# Patient Record
Sex: Male | Born: 1965 | Race: Black or African American | Hispanic: No | Marital: Single | Smoking: Current every day smoker
Health system: Southern US, Community
[De-identification: ages and names within clinical notes are randomized; demographics above are authoritative.]

## PROBLEM LIST (undated history)

## (undated) DIAGNOSIS — I10 Essential (primary) hypertension: Secondary | ICD-10-CM

## (undated) DIAGNOSIS — Z5900 Homelessness unspecified: Secondary | ICD-10-CM

## (undated) DIAGNOSIS — K922 Gastrointestinal hemorrhage, unspecified: Secondary | ICD-10-CM

## (undated) DIAGNOSIS — F10239 Alcohol dependence with withdrawal, unspecified: Secondary | ICD-10-CM

## (undated) DIAGNOSIS — F1020 Alcohol dependence, uncomplicated: Secondary | ICD-10-CM

## (undated) DIAGNOSIS — K8689 Other specified diseases of pancreas: Secondary | ICD-10-CM

## (undated) DIAGNOSIS — R7401 Elevation of levels of liver transaminase levels: Secondary | ICD-10-CM

## (undated) DIAGNOSIS — F10939 Alcohol use, unspecified with withdrawal, unspecified: Secondary | ICD-10-CM

## (undated) HISTORY — DX: Other specified diseases of pancreas: K86.89

## (undated) HISTORY — DX: Alcohol dependence with withdrawal, unspecified: F10.239

## (undated) HISTORY — DX: Elevation of levels of liver transaminase levels: R74.01

## (undated) HISTORY — DX: Alcohol use, unspecified with withdrawal, unspecified: F10.939

---

## 2014-03-17 DIAGNOSIS — Z720 Tobacco use: Secondary | ICD-10-CM | POA: Insufficient documentation

## 2014-03-17 DIAGNOSIS — I10 Essential (primary) hypertension: Secondary | ICD-10-CM | POA: Insufficient documentation

## 2015-08-30 DIAGNOSIS — Z5900 Homelessness unspecified: Secondary | ICD-10-CM

## 2015-08-31 NOTE — Congregational Nurse Program (Unsigned)
Client came to see mental Health CN at [facility].  Client stated, "I am going to be out of my Trazadone in three days.  I need you to [facility], Dr. Sheran FavaWeed and get the information to order them."  Client informed that in order for him to get his medication at no cost to him he needs to make an appointment with Lavinia SharpsMary Ann Placey, FNP.  Client said that he filled out initial paperwork to start appointments with Chales AbrahamsMary Ann and that he put the paper work under her door to be processed.  He did not hear back from the appointments desk about an appointment and was sad about this.  CN called and left a message for medical at [facility] to call me back to confirm an appointment for him.  Appointments desk Leavy CellaJasmine did call back and leave a message that he did not shoe for a June 22 appointment and the next available appointment was August 3 at 1:30p.  Leavy CellaJasmine said she did call him at his number, [phone] and left him a message that he could come in as a walk in client [facility].  Client denies suicidal or homicidal ideations and plan, client states,"I have a job now at night and I really need to sleep. I need my Trazodone for that."  Client informed that Lavinia SharpsMary Ann Placey, FNP needs to write his prescriptions and he can get them free from the health department.

## 2015-09-06 ENCOUNTER — Emergency Department (HOSPITAL_COMMUNITY)
Admission: EM | Admit: 2015-09-06 | Discharge: 2015-09-06 | Disposition: A | Discharge status: Home / Self Care | Payer: Self-pay | Attending: Emergency Medicine | Admitting: Emergency Medicine

## 2015-09-06 ENCOUNTER — Encounter (HOSPITAL_COMMUNITY): Payer: Self-pay | Admitting: Emergency Medicine

## 2015-09-06 DIAGNOSIS — E860 Dehydration: Secondary | ICD-10-CM | POA: Insufficient documentation

## 2015-09-06 DIAGNOSIS — F17200 Nicotine dependence, unspecified, uncomplicated: Secondary | ICD-10-CM | POA: Insufficient documentation

## 2015-09-06 DIAGNOSIS — I10 Essential (primary) hypertension: Secondary | ICD-10-CM | POA: Insufficient documentation

## 2015-09-06 DIAGNOSIS — R252 Cramp and spasm: Secondary | ICD-10-CM | POA: Insufficient documentation

## 2015-09-06 HISTORY — DX: Essential (primary) hypertension: I10

## 2015-09-06 LAB — BASIC METABOLIC PANEL
ANION GAP: 10 (ref 5–15)
BUN: 9 mg/dL (ref 6–20)
CALCIUM: 9.2 mg/dL (ref 8.9–10.3)
CO2: 23 mmol/L (ref 22–32)
Chloride: 97 mmol/L — ABNORMAL LOW (ref 101–111)
Creatinine, Ser: 0.92 mg/dL (ref 0.61–1.24)
GFR calc Af Amer: >60 mL/min (ref >60)
GLUCOSE: 87 mg/dL (ref 65–99)
Potassium: 3.8 mmol/L (ref 3.5–5.1)
SODIUM: 130 mmol/L — AB (ref 135–145)

## 2015-09-06 LAB — CBG MONITORING, ED: GLUCOSE-CAPILLARY: 106 mg/dL — AB (ref 65–99)

## 2015-09-06 MED ORDER — SODIUM CHLORIDE 0.9 % IV BOLUS (SEPSIS)
1000.0000 mL | Freq: Once | INTRAVENOUS | Status: AC
  Administered 2015-09-06: 1000 mL via INTRAVENOUS

## 2015-09-06 NOTE — ED Provider Notes (Signed)
CSN: [id number]     Arrival date & time 09/06/15  [id number] History   First MD Initiated Contact with Patient 09/06/15 [phone]     Chief Complaint  Patient presents with  . Abdominal Cramping    HPI  Patient presents with concern of diffuse cramping. Symptoms began yesterday. Since onset symptoms were persistent, worsening, diffuse. However, symptoms started to improve after the patient received IV fluids, oral rehydration provided by EMS, en route. Patient describes diffuse discomfort with the cramping, though no particular focal pain beyond abdomen, back, fingers, hands. No confusion, disorientation, syncope. No dyspnea. Patient was well prior to the onset of symptoms. 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 Yesterday was particularly hot.   Past Medical History  Diagnosis Date  . Hypertension    History reviewed. No pertinent past surgical history. No family history on file. Social History  Substance Use Topics  . Smoking status: Current Every Day Smoker  . Smokeless tobacco: None  . Alcohol Use: None    Review of Systems  Constitutional:       Per HPI, otherwise negative  HENT:       Per HPI, otherwise negative  Respiratory:       Per HPI, otherwise negative  Cardiovascular:       Per HPI, otherwise negative  Gastrointestinal: Negative for vomiting.  Endocrine:       Negative aside from HPI  Genitourinary:       Neg aside from HPI   Musculoskeletal:       Per HPI, otherwise negative  Skin: Negative.   Neurological: Negative for syncope.      Allergies  Review of patient's allergies indicates no known allergies.  Home Medications   Prior to Admission medications   Medication Sig Start Date End Date Taking? Authorizing Provider  hydrochlorothiazide (HYDRODIURIL) 25 MG tablet Take 25 mg by mouth daily.    Historical Provider, MD  metoprolol tartrate (LOPRESSOR) 25 MG tablet Take 12.5 mg by mouth 2 (two) times daily.    Historical Provider, MD   sertraline (ZOLOFT) 50 MG tablet Take 50 mg by mouth daily.    Historical Provider, MD   BP 137/79 mmHg  Pulse 76  Temp(Src) 98 F (36.7 C) (Oral)  Resp 18  SpO2 98% Physical Exam  Constitutional: He is oriented to person, place, and time. He appears well-developed. No distress.  HENT:  Head: Normocephalic and atraumatic.  Eyes: Conjunctivae and EOM are normal.  Cardiovascular: Normal rate and regular rhythm.   Pulmonary/Chest: Effort normal. No stridor. No respiratory distress.  Abdominal: He exhibits no distension.  Musculoskeletal: He exhibits no edema.  Neurological: He is alert and oriented to person, place, and time. No cranial nerve deficit. Coordination normal.  Skin: Skin is warm and dry.  Psychiatric: He has a normal mood and affect.  Nursing note and vitals reviewed.   ED Course  Procedures (including critical care time) Labs Review Labs Reviewed  BASIC METABOLIC PANEL - Abnormal; Notable for the following:    Sodium 130 (*)    Chloride 97 (*)    All other components within normal limits  CBG MONITORING, ED - Abnormal; Notable for the following:    Glucose-Capillary 106 (*)    All other components within normal limits  URINALYSIS, ROUTINE W REFLEX MICROSCOPIC (NOT AT [facility])    Following fluid resuscitation the patient is substantially better.   MDM  Patient presents with diffuse cramping, discomfort, but no evidence for distress, hemodynamically  compromised. Patient's history of working outside, and extremes encounter improve suggests dehydration. No lab evidence for acute kidney injury. Patient had multiple fluid boluses, with substantial improvement, was discharged in stable condition with primary care follow-up.  Gerhard Munch, MD 09/06/15 1017

## 2015-09-06 NOTE — ED Notes (Addendum)
Pt cbg 106 

## 2015-09-06 NOTE — Discharge Instructions (Signed)
As discussed, your evaluation today has been largely reassuring.  But, it is important that you monitor your condition carefully, and do not hesitate to return to the ED if you develop new, or concerning changes in your condition.  Otherwise, please follow-up with your physician for appropriate ongoing care.  Dehydration, Adult Dehydration is a condition in which you do not have enough fluid or water in your body. It happens when you take in less fluid than you lose. Vital organs such as the kidneys, brain, and heart cannot function without a proper amount of fluids. Any loss of fluids from the body can cause dehydration.  Dehydration can range from mild to severe. This condition should be treated right away to help prevent it from becoming severe. CAUSES  This condition may be caused by:  Vomiting.  Diarrhea.  Excessive sweating, such as when exercising in hot or humid weather.  Not drinking enough fluid during strenuous exercise or during an illness.  Excessive urine output.  Fever.  Certain medicines. RISK FACTORS This condition is more likely to develop in:  People who are taking certain medicines that cause the body to lose excess fluid (diuretics).   People who have a chronic illness, such as diabetes, that may increase urination.  Older adults.   People who live at high altitudes.   People who participate in endurance sports.  SYMPTOMS  Mild Dehydration  Thirst.  Dry lips.  Slightly dry mouth.  Dry, warm skin. Moderate Dehydration  Very dry mouth.   Muscle cramps.   Dark urine and decreased urine production.   Decreased tear production.   Headache.   Light-headedness, especially when you stand up from a sitting position.  Severe Dehydration  Changes in skin.   Cold and clammy skin.   Skin does not spring back quickly when lightly pinched and released.   Changes in body fluids.   Extreme thirst.   No tears.   Not able to  sweat when body temperature is high, such as in hot weather.   Minimal urine production.   Changes in vital signs.   Rapid, weak pulse (more than 100 beats per minute when you are sitting still).   Rapid breathing.   Low blood pressure.   Other changes.   Sunken eyes.   Cold hands and feet.   Confusion.  Lethargy and difficulty being awakened.  Fainting (syncope).   Short-term weight loss.   Unconsciousness. DIAGNOSIS  This condition may be diagnosed based on your symptoms. You may also have tests to determine how severe your dehydration is. These tests may include:   Urine tests.   Blood tests.  TREATMENT  Treatment for this condition depends on the severity. Mild or moderate dehydration can often be treated at home. Treatment should be started right away. Do not wait until dehydration becomes severe. Severe dehydration needs to be treated at the hospital. Treatment for Mild Dehydration  Drinking plenty of water to replace the fluid you have lost.   Replacing minerals in your blood (electrolytes) that you may have lost.  Treatment for Moderate Dehydration  Consuming oral rehydration solution (ORS). Treatment for Severe Dehydration  Receiving fluid through an IV tube.   Receiving electrolyte solution through a feeding tube that is passed through your nose and into your stomach (nasogastric tube or NG tube).  Correcting any abnormalities in electrolytes. HOME CARE INSTRUCTIONS   Drink enough fluid to keep your urine clear or pale yellow.   Drink water or fluid slowly  by taking small sips. You can also try sucking on ice cubes.  Have food or beverages that contain electrolytes. Examples include bananas and sports drinks.  Take over-the-counter and prescription medicines only as told by your health care provider.   Prepare ORS according to the manufacturer's instructions. Take sips of ORS every 5 minutes until your urine returns to  normal.  If you have vomiting or diarrhea, continue to try to drink water, ORS, or both.   If you have diarrhea, avoid:   Beverages that contain caffeine.   Fruit juice.   Milk.   Carbonated soft drinks.  Do not take salt tablets. This can lead to the condition of having too much sodium in your body (hypernatremia).  SEEK MEDICAL CARE IF:  You cannot eat or drink without vomiting.  You have had moderate diarrhea during a period of more than 24 hours.  You have a fever. SEEK IMMEDIATE MEDICAL CARE IF:   You have extreme thirst.  You have severe diarrhea.  You have not urinated in 6-8 hours, or you have urinated only a small amount of very dark urine.  You have shriveled skin.  You are dizzy, confused, or both.   This information is not intended to replace advice given to you by your health care provider. Make sure you discuss any questions you have with your health care provider.   Document Released: 02/11/2005 Document Revised: 11/02/2014 Document Reviewed: 06/29/2014 Elsevier Interactive Patient Education [employer]

## 2015-09-06 NOTE — ED Notes (Addendum)
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  Had a gatorade this am , lives at the shelter

## 2015-09-06 NOTE — ED Notes (Signed)
Pt is in stable condition upon d/c and ambulates from ED. 

## 2015-09-06 NOTE — ED Notes (Signed)
Myself and Tanisha, NT undressed patient, placed in gown, on continuous pulse oximetry and blood pressure cuff

## 2015-09-07 DIAGNOSIS — Z5900 Homelessness unspecified: Secondary | ICD-10-CM

## 2015-09-29 NOTE — Congregational Nurse Program (Signed)
Congregational Nurse Program Note  Date of Encounter: 09/07/2015  Past Medical History: Past Medical History:  Diagnosis Date  . Hypertension     Encounter Details:     CNP Questionnaire - 09/07/15 1710      Patient Demographics   Is this a new or existing patient? Existing   Patient is considered a/an Not Applicable   Race African-American/Black     Patient Assistance   Location of Patient Assistance Not Applicable   Patient's financial/insurance status Self-Pay   Uninsured Patient Yes   Interventions Counseled to make appt. with provider;Assisted patient in making appt.   Patient referred to apply for the following financial assistance [employer] insecurities addressed Provided food supplies   Transportation assistance No   Assistance securing medications Yes   Type of Assistance Other   Educational health offerings Acute disease;Behavioral health;Medications;Navigating the healthcare system;Safety     Encounter Details   Primary purpose of visit Acute Illness/Condition Visit;Safety   Was an Emergency Department visit averted? Yes   Does patient have a medical provider? No   Patient referred to Area Agency;Clinic   Was a mental health screening completed? (GAINS tool) No   Does patient have dental issues? No   Was a dental referral made? No   Does patient have vision issues? No   Was a vision referral made? No   Does your patient have an abnormal blood pressure today? No   Since previous encounter, have you referred patient for abnormal blood pressure that resulted in a new diagnosis or medication change? No   Does your patient have an abnormal blood glucose today? No   Since previous encounter, have you referred patient for abnormal blood glucose that resulted in a new diagnosis or medication change? No   Was there a life-saving intervention made? No      States is out of his Trazodone and needs it "today".  Discussed with him that he would  need a prescription and how to navigate the health care system to obtain a prescription.  Is from [facility] and has been receiving his meds the medication assistance program there.  Inquired if there was anyway he could return to [facility] to obtain medication refills while he awaiting an appointment with a local provider.  He stated he would take the [employer] bus transportation system.

## 2015-11-22 ENCOUNTER — Encounter (HOSPITAL_COMMUNITY): Payer: Self-pay | Admitting: Emergency Medicine

## 2015-11-22 ENCOUNTER — Inpatient Hospital Stay (HOSPITAL_COMMUNITY)
Admission: EM | Admit: 2015-11-22 | Discharge: 2015-11-24 | DRG: 195 | Disposition: A | Discharge status: Home / Self Care | Payer: Self-pay | Attending: Internal Medicine | Admitting: Internal Medicine

## 2015-11-22 ENCOUNTER — Emergency Department (HOSPITAL_COMMUNITY): Payer: Self-pay

## 2015-11-22 DIAGNOSIS — R590 Localized enlarged lymph nodes: Secondary | ICD-10-CM | POA: Diagnosis present

## 2015-11-22 DIAGNOSIS — R05 Cough: Secondary | ICD-10-CM

## 2015-11-22 DIAGNOSIS — R0602 Shortness of breath: Secondary | ICD-10-CM

## 2015-11-22 DIAGNOSIS — D649 Anemia, unspecified: Secondary | ICD-10-CM | POA: Diagnosis present

## 2015-11-22 DIAGNOSIS — R059 Cough, unspecified: Secondary | ICD-10-CM

## 2015-11-22 DIAGNOSIS — J9600 Acute respiratory failure, unspecified whether with hypoxia or hypercapnia: Secondary | ICD-10-CM | POA: Diagnosis present

## 2015-11-22 DIAGNOSIS — J181 Lobar pneumonia, unspecified organism: Principal | ICD-10-CM | POA: Diagnosis present

## 2015-11-22 DIAGNOSIS — J9601 Acute respiratory failure with hypoxia: Secondary | ICD-10-CM | POA: Diagnosis present

## 2015-11-22 DIAGNOSIS — J189 Pneumonia, unspecified organism: Secondary | ICD-10-CM

## 2015-11-22 DIAGNOSIS — J40 Bronchitis, not specified as acute or chronic: Secondary | ICD-10-CM | POA: Diagnosis present

## 2015-11-22 DIAGNOSIS — I10 Essential (primary) hypertension: Secondary | ICD-10-CM | POA: Diagnosis present

## 2015-11-22 DIAGNOSIS — F17200 Nicotine dependence, unspecified, uncomplicated: Secondary | ICD-10-CM | POA: Diagnosis present

## 2015-11-22 DIAGNOSIS — Z590 Homelessness: Secondary | ICD-10-CM

## 2015-11-22 LAB — COMPREHENSIVE METABOLIC PANEL
ALBUMIN: 3.9 g/dL (ref 3.5–5.0)
ALT: 48 U/L (ref 17–63)
AST: 84 U/L — AB (ref 15–41)
Alkaline Phosphatase: 96 U/L (ref 38–126)
Anion gap: 9 (ref 5–15)
BILIRUBIN TOTAL: 0.4 mg/dL (ref 0.3–1.2)
BUN: 10 mg/dL (ref 6–20)
CO2: 23 mmol/L (ref 22–32)
CREATININE: 0.71 mg/dL (ref 0.61–1.24)
Calcium: 9.4 mg/dL (ref 8.9–10.3)
Chloride: 104 mmol/L (ref 101–111)
GFR calc Af Amer: >60 mL/min (ref >60)
GLUCOSE: 90 mg/dL (ref 65–99)
POTASSIUM: 3.9 mmol/L (ref 3.5–5.1)
Sodium: 136 mmol/L (ref 135–145)
TOTAL PROTEIN: 8.9 g/dL — AB (ref 6.5–8.1)

## 2015-11-22 LAB — CBC WITH DIFFERENTIAL/PLATELET
BASOS ABS: 0 10*3/uL (ref 0.0–0.1)
Basophils Relative: 0 %
EOS ABS: 0.2 10*3/uL (ref 0.0–0.7)
EOS PCT: 2 %
HEMATOCRIT: 29.5 % — AB (ref 39.0–52.0)
Hemoglobin: 9.4 g/dL — ABNORMAL LOW (ref 13.0–17.0)
LYMPHS ABS: 2.1 10*3/uL (ref 0.7–4.0)
LYMPHS PCT: 23 %
MCH: 26.4 pg (ref 26.0–34.0)
MCHC: 31.9 g/dL (ref 30.0–36.0)
MCV: 82.9 fL (ref 78.0–100.0)
MONO ABS: 1.1 10*3/uL — AB (ref 0.1–1.0)
Monocytes Relative: 12 %
Neutro Abs: 5.5 10*3/uL (ref 1.7–7.7)
Neutrophils Relative %: 62 %
PLATELETS: 297 10*3/uL (ref 150–400)
RBC: 3.56 MIL/uL — AB (ref 4.22–5.81)
RDW: 16.4 % — AB (ref 11.5–15.5)
WBC: 8.9 10*3/uL (ref 4.0–10.5)

## 2015-11-22 LAB — RAPID STREP SCREEN (MED CTR MEBANE ONLY): Streptococcus, Group A Screen (Direct): NEGATIVE

## 2015-11-22 MED ORDER — PREDNISONE 20 MG PO TABS
60.0000 mg | ORAL_TABLET | Freq: Once | ORAL | Status: AC
  Administered 2015-11-22: 60 mg via ORAL
  Filled 2015-11-22: qty 3

## 2015-11-22 MED ORDER — ALBUTEROL (5 MG/ML) CONTINUOUS INHALATION SOLN: 5.0000 mg/h | INHALATION_SOLUTION | Freq: Once | RESPIRATORY_TRACT | Status: DC

## 2015-11-22 MED ORDER — ALBUTEROL (5 MG/ML) CONTINUOUS INHALATION SOLN
10.0000 mg/h | INHALATION_SOLUTION | Freq: Once | RESPIRATORY_TRACT | Status: AC
  Administered 2015-11-22: 10 mg/h via RESPIRATORY_TRACT
  Filled 2015-11-22: qty 20

## 2015-11-22 MED ORDER — IPRATROPIUM-ALBUTEROL 0.5-2.5 (3) MG/3ML IN SOLN
3.0000 mL | Freq: Once | RESPIRATORY_TRACT | Status: AC
  Administered 2015-11-22: 3 mL via RESPIRATORY_TRACT
  Filled 2015-11-22: qty 3

## 2015-11-22 MED ORDER — IBUPROFEN 200 MG PO TABS
600.0000 mg | ORAL_TABLET | Freq: Once | ORAL | Status: AC
  Administered 2015-11-22: 600 mg via ORAL
  Filled 2015-11-22: qty 3

## 2015-11-22 MED ORDER — LEVOFLOXACIN 750 MG PO TABS
750.0000 mg | ORAL_TABLET | Freq: Once | ORAL | Status: AC
  Administered 2015-11-22: 750 mg via ORAL
  Filled 2015-11-22 (×2): qty 1

## 2015-11-22 NOTE — ED Notes (Signed)
RTT paged for neb.

## 2015-11-22 NOTE — ED Notes (Signed)
Pt c/o epistaxis. Given tissues. Will notify provider.

## 2015-11-22 NOTE — ED Notes (Signed)
MD at bedside. 

## 2015-11-22 NOTE — ED Provider Notes (Signed)
[facility]-EMERGENCY DEPT Provider Note   CSN: [id number] Arrival date & time: 11/22/15  1739  By signing my name below, I, Modena Jansky, attest that this documentation has been prepared under the direction and in the presence of non-physician practitioner, Terance Hart, PA-C. Electronically Signed: Modena Jansky, Scribe. 11/22/2015. 6:23 PM.  History   Chief Complaint Chief Complaint  Patient presents with  . Generalized Body Aches   The history is provided by the patient. No language interpreter was used.   HPI Comments: Samuel Blevins is a 50 y.o. male who presents to the Emergency Department complaining of constant moderate sore throat that started 2 days ago. He is homeless and currently resides at a shelter. Other associated symptoms include intermittent cough productive of yellow sputum, generalized myalgias, diaphoresis, chills, HA, and wheezing. He has tried [facility] powder for symptoms without any relief. Reports a hx of pneumonia and smoking. Denies any sick contacts.   Past Medical History:  Diagnosis Date  . Hypertension     There are no active problems to display for this patient.   History reviewed. No pertinent surgical history.     Home Medications    Prior to Admission medications   Medication Sig Start Date End Date Taking? Authorizing Provider  hydrochlorothiazide (HYDRODIURIL) 25 MG tablet Take 25 mg by mouth daily.    Historical Provider, MD  metoprolol tartrate (LOPRESSOR) 25 MG tablet Take 12.5 mg by mouth 2 (two) times daily.    Historical Provider, MD  sertraline (ZOLOFT) 50 MG tablet Take 50 mg by mouth daily.    Historical Provider, MD    Family History No family history on file.  Social History Social History  Substance Use Topics  . Smoking status: Current Every Day Smoker  . Smokeless tobacco: Never Used  . Alcohol use Not on file     Allergies   Review of patient's allergies indicates no known allergies.   Review of Systems Review of  Systems  Constitutional: Positive for chills and diaphoresis. Negative for fever.  Respiratory: Positive for cough and wheezing.   Cardiovascular: Negative for chest pain.  Gastrointestinal: Positive for abdominal pain (Right-sided). Negative for nausea and vomiting.  Musculoskeletal: Positive for myalgias (Generalized).  Neurological: Positive for headaches. Negative for light-headedness.  All other systems reviewed and are negative.    Physical Exam Updated Vital Signs BP 145/85   Pulse 100   Temp 98.4 F (36.9 C) (Oral)   Resp 20   SpO2 96%   Physical Exam  Constitutional: He is oriented to person, place, and time. He appears well-developed and well-nourished. He appears ill. No distress.  Rigors  HENT:  Head: Normocephalic and atraumatic.  Eyes: Conjunctivae are normal. Pupils are equal, round, and reactive to light. Right eye exhibits no discharge. Left eye exhibits no discharge. No scleral icterus.  Neck: Normal range of motion. Neck supple.  Cardiovascular: Normal rate and regular rhythm.  Exam reveals no gallop and no friction rub.   No murmur heard. Pulmonary/Chest: Effort normal. No respiratory distress. He has wheezes. He has rhonchi. He has no rales. He exhibits no tenderness.  Diffuse rhonchi with mild wheezes  Abdominal: Soft. Bowel sounds are normal. He exhibits no distension and no mass. There is tenderness. There is guarding. There is no rebound. No hernia.  Mild generalized tenderness  Musculoskeletal: He exhibits no edema.  Neurological: He is alert and oriented to person, place, and time.  Skin: Skin is warm and dry.  Psychiatric: He has a normal  mood and affect.  Nursing note and vitals reviewed.    ED Treatments / Results  DIAGNOSTIC STUDIES: Oxygen Saturation is 96% on RA, normal by my interpretation.    COORDINATION OF CARE: 6:27 PM- Pt advised of plan for treatment and pt agrees.  Labs (all labs ordered are listed, but only abnormal results  are displayed) Labs Reviewed  COMPREHENSIVE METABOLIC PANEL - Abnormal; Notable for the following:       Result Value   Total Protein 8.9 (*)    AST 84 (*)    All other components within normal limits  CBC WITH DIFFERENTIAL/PLATELET - Abnormal; Notable for the following:    RBC 3.56 (*)    Hemoglobin 9.4 (*)    HCT 29.5 (*)    RDW 16.4 (*)    Monocytes Absolute 1.1 (*)    All other components within normal limits  RAPID STREP SCREEN (NOT AT [facility])  CULTURE, GROUP A STREP [facility])    EKG  EKG Interpretation None       Radiology Dg Chest 2 View  Result Date: 11/22/2015 CLINICAL DATA:  Body ache, chills and cough for 2 days. EXAM: CHEST  2 VIEW COMPARISON:  None. FINDINGS: The heart size and mediastinal contours are within normal limits. Both lungs are clear. The visualized skeletal structures are unremarkable. IMPRESSION: No active cardiopulmonary disease. Electronically Signed   By: Tollie Ethavid  Kwon M.D.   On: 11/22/2015 19:02    Procedures Procedures (including critical care time)  Medications Ordered in ED Medications  predniSONE (DELTASONE) tablet 60 mg (not administered)  ibuprofen (ADVIL,MOTRIN) tablet 600 mg (600 mg Oral Given 11/22/15 1858)  levofloxacin (LEVAQUIN) tablet 750 mg (750 mg Oral Given 11/22/15 1858)  ipratropium-albuterol (DUONEB) 0.5-2.5 (3) MG/3ML nebulizer solution 3 mL (3 mLs Nebulization Given 11/22/15 2013)  albuterol (PROVENTIL,VENTOLIN) solution continuous neb (10 mg/hr Nebulization Given 11/22/15 2134)     Initial Impression / Assessment and Plan / ED Course  I have reviewed the triage vital signs and the nursing notes.  Pertinent labs & imaging results that were available during my care of the patient were reviewed by me and considered in my medical decision making (see chart for details).  Clinical Course   50 year old male presents with productive cough and SOB. Patient is afebrile, not tachycardic, normotensive, and not hypoxic. He is tachypneic  and has frequent coughing with rigors on exam. CBC remarkable for anemia with no comparison. CMP unremarkable. Rapid strep is negative. CXR negative. Due to exam findings will treat for PNA with Levaquin. Ibuprofen given as well as Duoneb.  On recheck patient sounds mildly improved. Will given continuous neb and recheck. Patient signed out at shift change to Dr. Criss AlvineGoldston. Anticipate d/c.  Final Clinical Impressions(s) / ED Diagnoses   Final diagnoses:  Cough  Community acquired pneumonia    New Prescriptions New Prescriptions   No medications on file   I personally performed the services described in this documentation, which was scribed in my presence. The recorded information has been reviewed and is accurate.     Bethel BornKelly Marie Momoka [place], PA-C 11/22/15 2154    Pricilla LovelessScott Goldston, MD 11/22/15 [phone]

## 2015-11-22 NOTE — ED Triage Notes (Signed)
[facility]  Body aches and cough  x 2 days. sts nausea . Alert and oriented x 4.

## 2015-11-22 NOTE — ED Notes (Signed)
Bed: WTR6 Expected date:  Expected time:  Means of arrival:  Comments: 

## 2015-11-22 NOTE — ED Notes (Signed)
Bed: WTR8 Expected date:  Expected time:  Means of arrival:  Comments: 

## 2015-11-23 ENCOUNTER — Encounter (HOSPITAL_COMMUNITY): Payer: Self-pay | Admitting: Internal Medicine

## 2015-11-23 ENCOUNTER — Observation Stay (HOSPITAL_COMMUNITY): Payer: Self-pay

## 2015-11-23 DIAGNOSIS — J181 Lobar pneumonia, unspecified organism: Principal | ICD-10-CM

## 2015-11-23 DIAGNOSIS — D649 Anemia, unspecified: Secondary | ICD-10-CM

## 2015-11-23 DIAGNOSIS — J9600 Acute respiratory failure, unspecified whether with hypoxia or hypercapnia: Secondary | ICD-10-CM | POA: Diagnosis present

## 2015-11-23 DIAGNOSIS — J9601 Acute respiratory failure with hypoxia: Secondary | ICD-10-CM

## 2015-11-23 HISTORY — DX: Anemia, unspecified: D64.9

## 2015-11-23 LAB — VITAMIN B12: Vitamin B-12: 419 pg/mL (ref 180–914)

## 2015-11-23 LAB — RAPID URINE DRUG SCREEN, HOSP PERFORMED
Amphetamines: NOT DETECTED
BARBITURATES: NOT DETECTED
Benzodiazepines: NOT DETECTED
COCAINE: NOT DETECTED
Opiates: POSITIVE — AB
Tetrahydrocannabinol: NOT DETECTED

## 2015-11-23 LAB — BRAIN NATRIURETIC PEPTIDE: B Natriuretic Peptide: 286.7 pg/mL — ABNORMAL HIGH (ref 0.0–100.0)

## 2015-11-23 LAB — CBC WITH DIFFERENTIAL/PLATELET
Basophils Absolute: 0 10*3/uL (ref 0.0–0.1)
Basophils Relative: 0 %
EOS PCT: 0 %
Eosinophils Absolute: 0 10*3/uL (ref 0.0–0.7)
HEMATOCRIT: 29 % — AB (ref 39.0–52.0)
HEMOGLOBIN: 9.3 g/dL — AB (ref 13.0–17.0)
LYMPHS ABS: 1 10*3/uL (ref 0.7–4.0)
LYMPHS PCT: 9 %
MCH: 26.3 pg (ref 26.0–34.0)
MCHC: 32.1 g/dL (ref 30.0–36.0)
MCV: 82.2 fL (ref 78.0–100.0)
MONOS PCT: 3 %
Monocytes Absolute: 0.3 10*3/uL (ref 0.1–1.0)
Neutro Abs: 9.4 10*3/uL — ABNORMAL HIGH (ref 1.7–7.7)
Neutrophils Relative %: 88 %
Platelets: 272 10*3/uL (ref 150–400)
RBC: 3.53 MIL/uL — AB (ref 4.22–5.81)
RDW: 16.3 % — AB (ref 11.5–15.5)
WBC: 10.7 10*3/uL — AB (ref 4.0–10.5)

## 2015-11-23 LAB — IRON AND TIBC
IRON: 14 ug/dL — AB (ref 45–182)
SATURATION RATIOS: 3 % — AB (ref 17.9–39.5)
TIBC: 437 ug/dL (ref 250–450)
UIBC: 423 ug/dL

## 2015-11-23 LAB — HEPATIC FUNCTION PANEL
ALT: 46 U/L (ref 17–63)
AST: 72 U/L — AB (ref 15–41)
Albumin: 3.8 g/dL (ref 3.5–5.0)
Alkaline Phosphatase: 100 U/L (ref 38–126)
BILIRUBIN TOTAL: 0.4 mg/dL (ref 0.3–1.2)
Total Protein: 8.5 g/dL — ABNORMAL HIGH (ref 6.5–8.1)

## 2015-11-23 LAB — BASIC METABOLIC PANEL
Anion gap: 11 (ref 5–15)
BUN: 10 mg/dL (ref 6–20)
CHLORIDE: 103 mmol/L (ref 101–111)
CO2: 21 mmol/L — ABNORMAL LOW (ref 22–32)
Calcium: 9.2 mg/dL (ref 8.9–10.3)
Creatinine, Ser: 0.61 mg/dL (ref 0.61–1.24)
GFR calc Af Amer: >60 mL/min (ref >60)
GFR calc non Af Amer: >60 mL/min (ref >60)
Glucose, Bld: 119 mg/dL — ABNORMAL HIGH (ref 65–99)
POTASSIUM: 3.9 mmol/L (ref 3.5–5.1)
SODIUM: 135 mmol/L (ref 135–145)

## 2015-11-23 LAB — CK: Total CK: 290 U/L (ref 49–397)

## 2015-11-23 LAB — FOLATE: FOLATE: 10.4 ng/mL (ref >5.9)

## 2015-11-23 LAB — RETICULOCYTES
RBC.: 3.53 MIL/uL — ABNORMAL LOW (ref 4.22–5.81)
Retic Count, Absolute: 81.2 10*3/uL (ref 19.0–186.0)
Retic Ct Pct: 2.3 % (ref 0.4–3.1)

## 2015-11-23 LAB — LACTIC ACID, PLASMA: Lactic Acid, Venous: 1.2 mmol/L (ref 0.5–1.9)

## 2015-11-23 LAB — TSH: TSH: 2.216 u[IU]/mL (ref 0.350–4.500)

## 2015-11-23 LAB — TROPONIN I

## 2015-11-23 LAB — FERRITIN: Ferritin: 15 ng/mL — ABNORMAL LOW (ref 24–336)

## 2015-11-23 LAB — LIPASE, BLOOD: Lipase: 33 U/L (ref 11–51)

## 2015-11-23 MED ORDER — GUAIFENESIN-DM 100-10 MG/5ML PO SYRP
5.0000 mL | ORAL_SOLUTION | ORAL | Status: DC | PRN
  Administered 2015-11-23: 5 mL via ORAL
  Filled 2015-11-23: qty 10

## 2015-11-23 MED ORDER — IBUPROFEN 800 MG PO TABS
800.0000 mg | ORAL_TABLET | Freq: Once | ORAL | Status: AC
  Administered 2015-11-23: 800 mg via ORAL
  Filled 2015-11-23: qty 1

## 2015-11-23 MED ORDER — THIAMINE HCL 100 MG/ML IJ SOLN: 100.0000 mg | Freq: Every day | INTRAMUSCULAR | Status: DC

## 2015-11-23 MED ORDER — ENOXAPARIN SODIUM 40 MG/0.4ML ~~LOC~~ SOLN
40.0000 mg | SUBCUTANEOUS | Status: DC
  Administered 2015-11-23: 40 mg via SUBCUTANEOUS
  Filled 2015-11-23: qty 0.4

## 2015-11-23 MED ORDER — LORAZEPAM 2 MG/ML IJ SOLN: 1.0000 mg | Freq: Four times a day (QID) | INTRAMUSCULAR | Status: DC | PRN

## 2015-11-23 MED ORDER — VITAMIN B-1 100 MG PO TABS
100.0000 mg | ORAL_TABLET | Freq: Every day | ORAL | Status: DC
Start: 2015-11-23 — End: 2015-11-23
  Administered 2015-11-23: 100 mg via ORAL
  Filled 2015-11-23: qty 1

## 2015-11-23 MED ORDER — ACETAMINOPHEN 325 MG PO TABS
650.0000 mg | ORAL_TABLET | Freq: Four times a day (QID) | ORAL | Status: DC | PRN
  Administered 2015-11-23 – 2015-11-24 (×4): 650 mg via ORAL
  Filled 2015-11-23 (×4): qty 2

## 2015-11-23 MED ORDER — GUAIFENESIN 100 MG/5ML PO SOLN: 5.0000 mL | ORAL | Status: DC | PRN

## 2015-11-23 MED ORDER — ONDANSETRON HCL 4 MG PO TABS: 4.0000 mg | ORAL_TABLET | Freq: Four times a day (QID) | ORAL | Status: DC | PRN

## 2015-11-23 MED ORDER — IOPAMIDOL (ISOVUE-370) INJECTION 76%
100.0000 mL | Freq: Once | INTRAVENOUS | Status: AC | PRN
  Administered 2015-11-23: 100 mL via INTRAVENOUS

## 2015-11-23 MED ORDER — IPRATROPIUM BROMIDE 0.02 % IN SOLN: 0.5000 mg | RESPIRATORY_TRACT | Status: DC

## 2015-11-23 MED ORDER — BUDESONIDE 0.25 MG/2ML IN SUSP
0.2500 mg | Freq: Two times a day (BID) | RESPIRATORY_TRACT | Status: DC
  Administered 2015-11-23 – 2015-11-24 (×3): 0.25 mg via RESPIRATORY_TRACT
  Filled 2015-11-23 (×2): qty 2

## 2015-11-23 MED ORDER — LORAZEPAM 1 MG PO TABS: 1.0000 mg | ORAL_TABLET | Freq: Four times a day (QID) | ORAL | Status: DC | PRN

## 2015-11-23 MED ORDER — ALBUTEROL SULFATE (2.5 MG/3ML) 0.083% IN NEBU: 2.5000 mg | INHALATION_SOLUTION | RESPIRATORY_TRACT | Status: DC

## 2015-11-23 MED ORDER — LEVOFLOXACIN 750 MG PO TABS
750.0000 mg | ORAL_TABLET | Freq: Every day | ORAL | Status: DC
  Administered 2015-11-23: 750 mg via ORAL
  Filled 2015-11-23: qty 1

## 2015-11-23 MED ORDER — FOLIC ACID 1 MG PO TABS
1.0000 mg | ORAL_TABLET | Freq: Every day | ORAL | Status: DC
  Administered 2015-11-23: 1 mg via ORAL
  Filled 2015-11-23: qty 1

## 2015-11-23 MED ORDER — ONDANSETRON HCL 4 MG/2ML IJ SOLN: 4.0000 mg | Freq: Four times a day (QID) | INTRAMUSCULAR | Status: DC | PRN

## 2015-11-23 MED ORDER — IPRATROPIUM-ALBUTEROL 0.5-2.5 (3) MG/3ML IN SOLN
3.0000 mL | Freq: Three times a day (TID) | RESPIRATORY_TRACT | Status: DC
  Administered 2015-11-23 – 2015-11-24 (×4): 3 mL via RESPIRATORY_TRACT
  Filled 2015-11-23 (×4): qty 3

## 2015-11-23 MED ORDER — SODIUM CHLORIDE 0.9 % IV SOLN
INTRAVENOUS | Status: AC
Start: 2015-11-23 — End: 2015-11-24
  Administered 2015-11-23 (×3): via INTRAVENOUS

## 2015-11-23 MED ORDER — ORAL CARE MOUTH RINSE: 15.0000 mL | Freq: Two times a day (BID) | OROMUCOSAL | Status: DC

## 2015-11-23 MED ORDER — ACETAMINOPHEN 650 MG RE SUPP: 650.0000 mg | Freq: Four times a day (QID) | RECTAL | Status: DC | PRN

## 2015-11-23 MED ORDER — IOPAMIDOL (ISOVUE-370) INJECTION 76%: 100.0000 mL | Freq: Once | INTRAVENOUS | Status: DC | PRN

## 2015-11-23 MED ORDER — CHLORHEXIDINE GLUCONATE 0.12 % MT SOLN
15.0000 mL | Freq: Two times a day (BID) | OROMUCOSAL | Status: DC
Start: 2015-11-23 — End: 2015-11-24
  Administered 2015-11-23 – 2015-11-24 (×2): 15 mL via OROMUCOSAL
  Filled 2015-11-23 (×2): qty 15

## 2015-11-23 MED ORDER — ADULT MULTIVITAMIN W/MINERALS CH
1.0000 | ORAL_TABLET | Freq: Every day | ORAL | Status: DC
  Administered 2015-11-23 – 2015-11-24 (×2): 1 via ORAL
  Filled 2015-11-23 (×2): qty 1

## 2015-11-23 MED ORDER — METHOCARBAMOL 500 MG PO TABS
500.0000 mg | ORAL_TABLET | Freq: Three times a day (TID) | ORAL | Status: DC | PRN
  Administered 2015-11-23 (×2): 500 mg via ORAL
  Filled 2015-11-23 (×2): qty 1

## 2015-11-23 MED ORDER — ALBUTEROL SULFATE (2.5 MG/3ML) 0.083% IN NEBU: 2.5000 mg | INHALATION_SOLUTION | RESPIRATORY_TRACT | Status: DC | PRN

## 2015-11-23 NOTE — Progress Notes (Signed)
Spoke with pt concerning HH, PCP needs.  [facility]. There are no needs at present time.

## 2015-11-23 NOTE — Care Management Note (Signed)
Case Management Note  Patient Details  Name: Samuel Blevins MRN: [id number] Date of Birth: 12-Nov-1965  Subjective/Objective:                    Action/Plan:   Expected Discharge Date:                  Expected Discharge Plan:  Homeless Shelter  In-House Referral:  Clinical Social Work  Discharge planning Services  CM Consult  Post Acute Care Choice:  NA Choice offered to:  NA  DME Arranged:  N/A DME Agency:  NA  HH Arranged:  NA HH Agency:  NA  Status of Service:  Completed, signed off  If discussed at [employer] Meetings, dates discussed:    Additional CommentsGeni Bers:  Deziree Mokry, RN 11/23/2015, 12:57 PM

## 2015-11-23 NOTE — H&P (Signed)
History and Physical    Velmer Woelfel ZOX:[id number] DOB: Aug 04, 1965 DOA: 11/22/2015  PCP: No primary care provider on file.  Patient coming from: Patient is homeless.  Chief Complaint: Shortness of breath.  HPI: Carlis Blanchard is a 50 y.o. male with no significant past medical history presents with the ER with complaints of shortness of breath. Patient states he has been short of breath for last 2 days. In the ER patient was found to be diffusely wheezing for which patient was given nebulizer treatment and prednisone. Chest x-ray did not show anything acute. Since patient was still short of breath patient is being admitted for further observation. On my exam patient is diaphoretic and looking short of breath for which I have ordered CT angiogram of the chest. On exam patient has no wheezes. Denies any chest pain but has productive cough.   ED Course: Nebulizer treatment and prednisone.  Review of Systems: As per HPI, rest all negative.   Past Medical History:  Diagnosis Date  . Hypertension     History reviewed. No pertinent surgical history.   reports that he has been smoking.  He has never used smokeless tobacco. He reports that he drinks alcohol. His drug history is not on file.  No Known Allergies  Family History  Problem Relation Age of Onset  . Diabetes Mellitus II Neg Hx     Prior to Admission medications   Not on File    Physical Exam: Vitals:   11/22/15 2015 11/22/15 2134 11/22/15 2225 11/23/15 0032  BP:   132/57 131/69  Pulse:   (!) 121 100  Resp:   18   Temp:   98.2 F (36.8 C) 98 F (36.7 C)  TempSrc:   Oral Oral  SpO2: 99% 98% 98% 97%  Weight:    178 lb 11.2 oz (81.1 kg)  Height:    5\' 9"  (1.753 m)      Constitutional: Not in distress. Vitals:   11/22/15 2015 11/22/15 2134 11/22/15 2225 11/23/15 0032  BP:   132/57 131/69  Pulse:   (!) 121 100  Resp:   18   Temp:   98.2 F (36.8 C) 98 F (36.7 C)  TempSrc:   Oral Oral  SpO2: 99% 98% 98%  97%  Weight:    178 lb 11.2 oz (81.1 kg)  Height:    5\' 9"  (1.753 m)   Eyes: Anicteric no pallor. ENMT: No discharge from the ears eyes nose or mouth. Neck: No JVD appreciated no mass felt. Respiratory: No rhonchi or crepitations. Cardiovascular: S1-S2 heard. Abdomen: Soft nontender bowel sounds present. No guarding or rigidity. Musculoskeletal: No edema. Skin: No rash. Neurologic: Alert awake oriented to time place and person. Moves all extremities. Psychiatric: Appears normal.   Labs on Admission: I have personally reviewed following labs and imaging studies  CBC:  Recent Labs Lab 11/22/15 1844  WBC 8.9  NEUTROABS 5.5  HGB 9.4*  HCT 29.5*  MCV 82.9  PLT 297   Basic Metabolic Panel:  Recent Labs Lab 11/22/15 1844  NA 136  K 3.9  CL 104  CO2 23  GLUCOSE 90  BUN 10  CREATININE 0.71  CALCIUM 9.4   GFR: Estimated Creatinine Clearance: 109.2 mL/min (by C-G formula based on SCr of 0.71 mg/dL). Liver Function Tests:  Recent Labs Lab 11/22/15 1844  AST 84*  ALT 48  ALKPHOS 96  BILITOT 0.4  PROT 8.9*  ALBUMIN 3.9   No results for input(s): LIPASE, AMYLASE in  the last 168 hours. No results for input(s): AMMONIA in the last 168 hours. Coagulation Profile: No results for input(s): INR, PROTIME in the last 168 hours. Cardiac Enzymes: No results for input(s): CKTOTAL, CKMB, CKMBINDEX, TROPONINI in the last 168 hours. BNP (last 3 results) No results for input(s): PROBNP in the last 8760 hours. HbA1C: No results for input(s): HGBA1C in the last 72 hours. CBG: No results for input(s): GLUCAP in the last 168 hours. Lipid Profile: No results for input(s): CHOL, HDL, LDLCALC, TRIG, CHOLHDL, LDLDIRECT in the last 72 hours. Thyroid Function Tests: No results for input(s): TSH, T4TOTAL, FREET4, T3FREE, THYROIDAB in the last 72 hours. Anemia Panel: No results for input(s): VITAMINB12, FOLATE, FERRITIN, TIBC, IRON, RETICCTPCT in the last 72 hours. Urine  analysis: No results found for: COLORURINE, APPEARANCEUR, LABSPEC, PHURINE, GLUCOSEU, HGBUR, BILIRUBINUR, KETONESUR, PROTEINUR, UROBILINOGEN, NITRITE, LEUKOCYTESUR Sepsis Labs: @LABRCNTIP (procalcitonin:4,lacticidven:4) ) Recent Results (from the past 240 hour(s))  Rapid strep screen     Status: None   Collection Time: 11/22/15  6:50 PM  Result Value Ref Range Status   Streptococcus, Group A Screen (Direct) NEGATIVE NEGATIVE Final    Comment: (NOTE) A Rapid Antigen test may result negative if the antigen level in the sample is below the detection level of this test. The FDA has not cleared this test as a stand-alone test therefore the rapid antigen negative result has reflexed to a Group A Strep culture.      Radiological Exams on Admission: Dg Chest 2 View  Result Date: 11/22/2015 CLINICAL DATA:  Body ache, chills and cough for 2 days. EXAM: CHEST  2 VIEW COMPARISON:  None. FINDINGS: The heart size and mediastinal contours are within normal limits. Both lungs are clear. The visualized skeletal structures are unremarkable. IMPRESSION: No active cardiopulmonary disease. Electronically Signed   By: Tollie Ethavid  Kwon M.D.   On: 11/22/2015 19:02    EKG: Independently reviewed. Normal sinus rhythm with possible left atrial enlargement and incomplete right bundle branch block.  Assessment/Plan Principal Problem:   Acute respiratory failure with hypoxia (HCC) Active Problems:   Bronchitis   Normochromic normocytic anemia   Acute respiratory failure (HCC)    1. Acute respiratory failure possibly from acute bronchitis - since patient is still short of breath I have ordered CT angiogram of the chest. Patient has received 1 dose of prednisone in the ER. Further decision is to be decided if patient is still wheezing. I've continued patient on nebulizer and Pulmicort. Check HIV status. Check urine drug screen. Check BNP. 2. Elevated blood pressure - closely for blood pressure trends. 3. Tobacco  abuse - tobacco cessation counseling requested. 4. History of alcohol abuse - on CIWA protocol. 5. Normocytic normochromic anemia - check anemia panel. Follow CBC. Patient will need eventually colonoscopy given the age.   DVT prophylaxis: Lovenox. Code Status: Full code.  Family Communication: Discussed with patient.  Disposition Plan: Patient is homeless so social work consult.  Consults called: Social work.  Admission status: Observation.    Eduard ClosKAKRAKANDY,Teagon Kron N. MD [facility] Pager [phone].  If 7PM-7AM, please contact night-coverage [email or web address] Password [email or web address]  11/23/2015, 1:34 AM

## 2015-11-23 NOTE — Progress Notes (Addendum)
Patient ID: Roxan HockeyRobert Rudy, male   DOB: [birth year], 50 y.o.   MRN: [id number]  Patient admitted after midnight. For details please refer to admission note done 11/23/2015.  50 -year-old male with no significant past medical history who presented to [facility] ED with worsening shortness of breath for past 2 days prior to this admission, wheezing. Chest x-ray did not show acute findings. CT angiogram of the chest showed no pulmonary embolism but patient does have hilar adenopathy which warrant follow-up after completion of treatment. He was started on empiric Levaquin for possible pneumonia.  Assessment and plan:  Lobar pneumonia, unspecified organism / leukocytosis - Treating with empiric Levaquin - Continue budesonide nebulizer twice daily, DuoNeb nebulizer 3 times daily - Continue prednisone    Manson Passeylma Thersa Mohiuddin [facility] [phone]

## 2015-11-23 NOTE — Progress Notes (Signed)
Pharmacy Antibiotic Note  Samuel HockeyRobert Blevins is a 50 y.o. male admitted on 11/22/2015 with pneumonia.  Pharmacy has been consulted for Levofloxacin dosing.  Plan: Levofloxacin 750mg  PO q24hr  Height: 5\' 9"  (175.3 cm) Weight: 178 lb 11.2 oz (81.1 kg) IBW/kg (Calculated) : 70.7  Temp (24hrs), Avg:98.2 F (36.8 C), Min:98 F (36.7 C), Max:98.4 F (36.9 C)   Recent Labs Lab 11/22/15 1844 11/23/15 0221  WBC 8.9 10.7*  CREATININE 0.71 0.61  LATICACIDVEN  --  1.2    Estimated Creatinine Clearance: 109.2 mL/min (by C-G formula based on SCr of 0.61 mg/dL).    No Known Allergies  Antimicrobials this admission: Levofloxacin 9/27 >>  Dose adjustments this admission: -  Microbiology results: pending  Thank you for allowing pharmacy to be a part of this patient's care.  Samuel Blevins, Samuel Blevins 11/23/2015 4:06 AM

## 2015-11-23 NOTE — Clinical Social Work Note (Signed)
Clinical Social Work Assessment  Patient Details  Name: Samuel HockeyRobert Ladouceur MRN: [id number] Date of Birth: 12-07-1965  Date of referral:  11/23/15               Reason for consult:  Housing Concerns/Homelessness, [employer] Resources, Discharge Planning                Permission sought to share information with:  Sports coachCase Manager, Oceanographeracility Contact Representative Permission granted to share information::  Yes, Verbal Permission Granted  Name::        Agency[facility]  Relationship::     Contact Information:     Housing/Transportation Living arrangements for the past 2 months:  [facility] (Last 2 months) Source of Information:  Patient, Development worker, communityMedical Team, Other (Comment Required) (shelter) Patient Interpreter Needed:  None Criminal Activity/Legal Involvement Pertinent to Current Situation/Hospitalization:  No - Comment as needed Significant Relationships:  [employer] Support Lives with:  Self Do you feel safe going back to the place where you live?  Yes Need for family participation in patient care:  No (Coment)  Care giving concerns:  Patient denies any care giving concerns. Reports [facility] and plans on returning. He is independent with his ADLs.   Social Worker assessment / plan:  LCSW received consult for homelessness.  [facility] and was [facility] in for medical treatment. [facility] and made aware of his admission and holding his bed for up to 7 days as long as the following occur: Calls to update [facility] daily Letter indicating admission date, discharge date, need of bed rest and time and for how long in which LCSW can provide at DC with MD signature.   [facility]. LCSW to provide bus pass.   Employment status:  Unemployed Health and safety inspectornsurance information:  Self Pay (Medicaid Pending) PT Recommendations:  Not assessed at  this time Information / Referral to community resources:  Shelter  Patient/Family's Response to care:  Agreeable plan   Patient/Family's Understanding of and Emotional Response to Diagnosis, Current Treatment, and Prognosis:  Patient understanding of illness and reason for admission. He is very concerned about his stuff and housing at facility.  He was made aware of discussion and that he can return which helped alleviate his anxiety.  Emotional Assessment Appearance:  Appears stated age Attitude/Demeanor/Rapport:    Affect (typically observed):  Accepting, Adaptable, Hopeful, Pleasant Orientation:  Oriented to Self, Oriented to Place, Oriented to  Time, Oriented to Situation Alcohol / Substance use:  Other (declined, however was positive for opiates) Psych involvement (Current and /or in the community):  No (Comment)  Discharge Needs  Concerns to be addressed:  Denies Needs/Concerns at this time Readmission within the last 30 days:  No Current discharge risk:  None Barriers to Discharge:  Continued Medical Work up   Cordella RegisterCoble, Cecilee Rosner N, LCSW 11/23/2015, 12:28 PM

## 2015-11-24 DIAGNOSIS — D72829 Elevated white blood cell count, unspecified: Secondary | ICD-10-CM

## 2015-11-24 LAB — HIV ANTIBODY (ROUTINE TESTING W REFLEX): HIV SCREEN 4TH GENERATION: NONREACTIVE

## 2015-11-24 MED ORDER — ALBUTEROL SULFATE HFA 108 (90 BASE) MCG/ACT IN AERS: 2.0000 | INHALATION_SPRAY | Freq: Four times a day (QID) | RESPIRATORY_TRACT | 2 refills | Status: DC | PRN

## 2015-11-24 MED ORDER — ACETAMINOPHEN 325 MG PO TABS: 650.0000 mg | ORAL_TABLET | Freq: Four times a day (QID) | ORAL | 0 refills | Status: DC | PRN

## 2015-11-24 MED ORDER — LEVOFLOXACIN 750 MG PO TABS: 750.0000 mg | ORAL_TABLET | Freq: Every day | ORAL | 0 refills | Status: DC

## 2015-11-24 MED ORDER — METHOCARBAMOL 500 MG PO TABS: 500.0000 mg | ORAL_TABLET | Freq: Three times a day (TID) | ORAL | 0 refills | Status: DC | PRN

## 2015-11-24 MED FILL — VENTOLIN HFA 90 MCG INHALER: 108 (90 BAS | 25 days supply | Qty: 18 | Fill #0

## 2015-11-24 MED FILL — levoFLOXacin 750 MG TABS: 750 | 5 days supply | Qty: 5 | Fill #0

## 2015-11-24 MED FILL — METHOCARBAMOL 500 MG TABLET: 500 | 6 days supply | Qty: 20 | Fill #0

## 2015-11-24 NOTE — Progress Notes (Signed)
Spoke with pt concerning [facility] and [facility]. [facility] have no appointments. Pt is active with [facility].  [facility] [phone].  [facility] and [facility].

## 2015-11-24 NOTE — Discharge Instructions (Signed)
Levofloxacin tablets °What is this medicine? °LEVOFLOXACIN (lee voe FLOX a sin) is a quinolone antibiotic. It is used to treat certain kinds of bacterial infections. It will not work for colds, flu, or other viral infections. °This medicine may be used for other purposes; ask your health care provider or pharmacist if you have questions. °What should I tell my health care provider before I take this medicine? °They need to know if you have any of these conditions: °-bone problems °-cerebral disease °-history of low levels of potassium in the blood °-irregular heartbeat °-joint problems °-kidney disease °-myasthenia gravis °-seizures °-tendon problems °-tingling of the fingers or toes, or other nerve disorder °-an unusual or allergic reaction to levofloxacin, other quinolone antibiotics, foods, dyes, or preservatives °-pregnant or trying to get pregnant °-breast-feeding °How should I use this medicine? °Take this medicine by mouth with a full glass of water. Follow the directions on the prescription label. This medicine can be taken with or without food. Take your medicine at regular intervals. Do not take your medicine more often than directed. Do not skip doses or stop your medicine early even if you feel better. Do not stop taking except on your doctor's advice. °A special MedGuide will be given to you by the pharmacist with each prescription and refill. Be sure to read this information carefully each time. °Talk to your pediatrician regarding the use of this medicine in children. While this drug may be prescribed for children as young as 6 months for selected conditions, precautions do apply. °Overdosage: If you think you have taken too much of this medicine contact a poison control center or emergency room at once. °NOTE: This medicine is only for you. Do not share this medicine with others. °What if I miss a dose? °If you miss a dose, take it as soon as you remember. If it is almost time for your next dose,  take only that dose. Do not take double or extra doses. °What may interact with this medicine? °Do not take this medicine with any of the following medications: °-arsenic trioxide °-chloroquine °-droperidol °-medicines for irregular heart rhythm like amiodarone, disopyramide, dofetilide, flecainide, quinidine, procainamide, sotalol °-some medicines for depression or mental problems like phenothiazines, pimozide, and ziprasidone °This medicine may also interact with the following medications: °-amoxapine °-antacids °-birth control pills °-cisapride °-dairy products °-didanosine (ddI) buffered tablets or powder °-haloperidol °-multivitamins °-NSAIDS, medicines for pain and inflammation, like ibuprofen or naproxen °-retinoid products like tretinoin or isotretinoin °-risperidone °-some other antibiotics like clarithromycin or erythromycin °-sucralfate °-theophylline °-warfarin °This list may not describe all possible interactions. Give your health care provider a list of all the medicines, herbs, non-prescription drugs, or dietary supplements you use. Also tell them if you smoke, drink alcohol, or use illegal drugs. Some items may interact with your medicine. °What should I watch for while using this medicine? °Tell your doctor or health care professional if your symptoms do not improve or if they get worse. Drink several glasses of water a day and cut down on drinks that contain caffeine. You must not get dehydrated while taking this medicine. °You may get drowsy or dizzy. Do not drive, use machinery, or do anything that needs mental alertness until you know how this medicine affects you. Do not sit or stand up quickly, especially if you are an older patient. This reduces the risk of dizzy or fainting spells. °This medicine can make you more sensitive to the sun. Keep out of the sun. If you cannot avoid   being in the sun, wear protective clothing and use a sunscreen. Do not use sun lamps or tanning beds/booths. Contact  your doctor if you get a sunburn. °If you are a diabetic monitor your blood glucose carefully. If you get an unusual reading stop taking this medicine and call your doctor right away. °Do not treat diarrhea with over-the-counter products. Contact your doctor if you have diarrhea that lasts more than 2 days or if the diarrhea is severe and watery. °Avoid antacids, calcium, iron, and zinc products for 2 hours before and 2 hours after taking a dose of this medicine. °What side effects may I notice from receiving this medicine? °Side effects that you should report to your doctor or health care professional as soon as possible: °-allergic reactions like skin rash or hives, swelling of the face, lips, or tongue °-anxious °-confusion °-depressed mood °-diarrhea °-fast, irregular heartbeat °-hallucination, loss of contact with reality °-joint, muscle, or tendon pain or swelling °-pain, tingling, numbness in the hands or feet °-suicidal thoughts or other mood changes °-sunburn °-unusually weak or tired °Side effects that usually do not require medical attention (report to your doctor or health care professional if they continue or are bothersome): °-dry mouth °-headache °-nausea °-trouble sleeping °This list may not describe all possible side effects. Call your doctor for medical advice about side effects. You may report side effects to FDA at 1-800-FDA-1088. °Where should I keep my medicine? °Keep out of the reach of children. °Store at room temperature between 15 and 30 degrees C (59 and 86 degrees F). Keep in a tightly closed container. Throw away any unused medicine after the expiration date. °NOTE: This sheet is a summary. It may not cover all possible information. If you have questions about this medicine, talk to your doctor, pharmacist, or health care provider. °  °© 2016, [employer]. (2014-09-22 12:40:18) ° °

## 2015-11-24 NOTE — Discharge Summary (Signed)
Physician Discharge Summary  Samuel [facility]ins ZOX:[id number] DOB: 09/25/1965 DOA: 11/22/2015  PCP: No primary care provider on file.  Admit date: 11/22/2015 Discharge date: 11/24/2015  Recommendations for Outpatient Follow-up:  Continue Levaquin for 5 more days on discharge Follow up with PCP in 3-4 weeks, repeat CT scan to follow up on CT scan findings on this admission (right hilar, right hilar and subcarinal adenopathy of indeterminate etiology and clinical significance)  Discharge Diagnoses:  Principal Problem:   Acute respiratory failure with hypoxia (HCC) Active Problems:   Bronchitis   Normochromic normocytic anemia   Acute respiratory failure (HCC)   Lobar pneumonia, unspecified organism (HCC)    Discharge Condition: stable   Diet recommendation: as tolerated   History of present illness:  50 year-old male with no significant past medical history who presented to [facility] long ED with worsening shortness of breath for past 2 days prior to this admission, wheezing. Chest x-ray did not show acute findings. CT angiogram of the chest showed no pulmonary embolism but patient does have hilar adenopathy which warrant follow-up after completion of treatment. He was started on empiric Levaquin for possible pneumonia.  Hospital Course:   Principal Problem:   Acute respiratory failure with hypoxia (HCC) - Stable respiratory status - O2 sats 100% on room air  Active Problems:   Lobar pneumonia, unspecified organism (HCC) / Leukocytosis - Continue Levaquin for 5 days on discharge     Hilar adenopathy - Recommended to follow-up CT scan in about 4 weeks with primary care physician    Signed:  Manson Passey, MD  [facility] 11/24/2015, 8:18 AM  Pager #: [phone]  Time spent in minutes: less than 30 minutes    Discharge Exam: Vitals:   11/23/15 1346 11/24/15 0458  BP: (!) 144/75 (!) 141/74  Pulse: 81 82  Resp: 18 18  Temp: 98.3 F (36.8 C) 98.4 F (36.9 C)    Vitals:   11/23/15 0804 11/23/15 1346 11/23/15 2037 11/24/15 0458  BP:  (!) 144/75  (!) 141/74  Pulse:  81  82  Resp:  18  18  Temp:  98.3 F (36.8 C)  98.4 F (36.9 C)  TempSrc:  Oral  Oral  SpO2: 98% 100% 98% 100%  Weight:      Height:        General: Pt is alert, follows commands appropriately, not in acute distress Cardiovascular: Regular rate and rhythm, S1/S2 +, no murmurs Respiratory: Clear to auscultation bilaterally, no wheezing, no crackles, no rhonchi Abdominal: Soft, non tender, non distended, bowel sounds +, no guarding Extremities: no edema, no cyanosis, pulses palpable bilaterally DP and PT Neuro: Grossly nonfocal  Discharge Instructions  Discharge Instructions    Call MD for:  difficulty breathing, headache or visual disturbances    Complete by:  As directed    Call MD for:  persistant nausea and vomiting    Complete by:  As directed    Call MD for:  redness, tenderness, or signs of infection (pain, swelling, redness, odor or green/yellow discharge around incision site)    Complete by:  As directed    Call MD for:  severe uncontrolled pain    Complete by:  As directed    Diet - low sodium heart healthy    Complete by:  As directed    Discharge instructions    Complete by:  As directed    Continue Levaquin for 5 more days on discharge Follow up with PCP in 3-4 weeks, repeat CT scan  to follow up on CT scan findings on this admission (right hilar, right hilar and subcarinal adenopathy of indeterminate etiology and clinical significance)   Increase activity slowly    Complete by:  As directed        Medication List    TAKE these medications   acetaminophen 325 MG tablet Commonly known as:  TYLENOL Take 2 tablets (650 mg total) by mouth every 6 (six) hours as needed for mild pain (or Fever >/= 101).   albuterol 108 (90 Base) MCG/ACT inhaler Commonly known as:  PROVENTIL HFA;VENTOLIN HFA Inhale 2 puffs into the lungs every 6 (six) hours as needed for  wheezing or shortness of breath.   levofloxacin 750 MG tablet Commonly known as:  LEVAQUIN Take 1 tablet (750 mg total) by mouth daily at 6 PM.   methocarbamol 500 MG tablet Commonly known as:  ROBAXIN Take 1 tablet (500 mg total) by mouth every 8 (eight) hours as needed for muscle spasms.         The results of significant diagnostics from this hospitalization (including imaging, microbiology, ancillary and laboratory) are listed below for reference.    Significant Diagnostic Studies: Dg Chest 2 View  Result Date: 11/22/2015 CLINICAL DATA:  Body ache, chills and cough for 2 days. EXAM: CHEST  2 VIEW COMPARISON:  None. FINDINGS: The heart size and mediastinal contours are within normal limits. Both lungs are clear. The visualized skeletal structures are unremarkable. IMPRESSION: No active cardiopulmonary disease. Electronically Signed   By: Tollie Ethavid  Kwon M.D.   On: 11/22/2015 19:02   Ct Angio Chest Pe W Or Wo Contrast  Result Date: 11/23/2015 CLINICAL DATA:  50 year old male with productive cough and shortness of breath. EXAM: CT ANGIOGRAPHY CHEST WITH CONTRAST TECHNIQUE: Multidetector CT imaging of the chest was performed using the standard protocol during bolus administration of intravenous contrast. Multiplanar CT image reconstructions and MIPs were obtained to evaluate the vascular anatomy. CONTRAST:  100 cc Isovue 370 COMPARISON:  Chest radiograph dated 11/22/2015 FINDINGS: The lungs are clear. There is no pleural effusion or pneumothorax. Mild bilateral bronchiectatic changes may be related to chronic bronchitis. Clinical correlation is recommended. The central airways are patent. There is mild atherosclerotic calcification of the aortic arch. No aneurysmal dilatation or evidence of dissection. The origins of the great vessels of the aortic arch appear patent. No CT evidence of pulmonary embolism. There is no cardiomegaly or pericardial effusion. Right hilar adenopathy. Subcarinal  adenopathy measures 15 mm in short axis. Top-normal left hilar lymph nodes. The esophagus is grossly unremarkable. No thyroid nodules identified. There is no axillary adenopathy. The chest wall soft tissues appear unremarkable. The osseous structures are intact. Fatty liver. The visualized upper abdomen is otherwise unremarkable. IMPRESSION: No CT evidence of pulmonary embolism. Right hilar, right hilar and subcarinal adenopathy of indeterminate etiology and clinical significance. Although this may be reactive. Other etiologies are not excluded. Clinical correlation and follow-up recommended. Mild bilateral bronchiectatic changes, likely related to chronic bronchitis. Clinical correlation is recommended. Electronically Signed   By: Elgie CollardArash  Radparvar M.D.   On: 11/23/2015 03:26    Microbiology: Recent Results (from the past 240 hour(s))  Rapid strep screen     Status: None   Collection Time: 11/22/15  6:50 PM  Result Value Ref Range Status   Streptococcus, Group A Screen (Direct) NEGATIVE NEGATIVE Final    Comment: (NOTE) A Rapid Antigen test may result negative if the antigen level in the sample is below the detection level of  this test. The FDA has not cleared this test as a stand-alone test therefore the rapid antigen negative result has reflexed to a Group A Strep culture.      Labs: Basic Metabolic Panel:  Recent Labs Lab 11/22/15 1844 11/23/15 0221  NA 136 135  K 3.9 3.9  CL 104 103  CO2 23 21*  GLUCOSE 90 119*  BUN 10 10  CREATININE 0.71 0.61  CALCIUM 9.4 9.2   Liver Function Tests:  Recent Labs Lab 11/22/15 1844 11/23/15 0221  AST 84* 72*  ALT 48 46  ALKPHOS 96 100  BILITOT 0.4 0.4  PROT 8.9* 8.5*  ALBUMIN 3.9 3.8    Recent Labs Lab 11/23/15 0221  LIPASE 33   No results for input(s): AMMONIA in the last 168 hours. CBC:  Recent Labs Lab 11/22/15 1844 11/23/15 0221  WBC 8.9 10.7*  NEUTROABS 5.5 9.4*  HGB 9.4* 9.3*  HCT 29.5* 29.0*  MCV 82.9 82.2   PLT 297 272   Cardiac Enzymes:  Recent Labs Lab 11/23/15 0221  CKTOTAL 290  TROPONINI <0.03   BNP: BNP (last 3 results)  Recent Labs  11/23/15 0221  BNP 286.7*    ProBNP (last 3 results) No results for input(s): PROBNP in the last 8760 hours.  CBG: No results for input(s): GLUCAP in the last 168 hours.

## 2015-11-24 NOTE — Progress Notes (Signed)
LCSW made aware that patient is medically stable for discharge back [facility]. LCSW completed letter requested by shelter and patient given letter to take. LCSW also provided patient with bus pass back to shelter.  No other needs at this time. [facility] and other needs during the day.  Deretha EmoryHannah Tatum Corl LCSW, MSW Clinical Social Work: Optician, dispensingystem Wide Float Coverage for :  [employer] [phone]

## 2015-11-25 LAB — CULTURE, GROUP A STREP (THRC)

## 2015-11-28 LAB — CULTURE, BLOOD (ROUTINE X 2)
CULTURE: NO GROWTH
CULTURE: NO GROWTH

## 2018-08-10 ENCOUNTER — Emergency Department (HOSPITAL_COMMUNITY): Payer: Self-pay

## 2018-08-10 ENCOUNTER — Emergency Department (HOSPITAL_COMMUNITY)
Admission: EM | Admit: 2018-08-10 | Discharge: 2018-08-10 | Disposition: A | Discharge status: Home / Self Care | Payer: Self-pay | Attending: Emergency Medicine | Admitting: Emergency Medicine

## 2018-08-10 ENCOUNTER — Encounter (HOSPITAL_COMMUNITY): Payer: Self-pay

## 2018-08-10 ENCOUNTER — Other Ambulatory Visit: Payer: Self-pay

## 2018-08-10 DIAGNOSIS — I10 Essential (primary) hypertension: Secondary | ICD-10-CM | POA: Insufficient documentation

## 2018-08-10 DIAGNOSIS — F17210 Nicotine dependence, cigarettes, uncomplicated: Secondary | ICD-10-CM | POA: Insufficient documentation

## 2018-08-10 DIAGNOSIS — R040 Epistaxis: Secondary | ICD-10-CM | POA: Insufficient documentation

## 2018-08-10 DIAGNOSIS — R1013 Epigastric pain: Secondary | ICD-10-CM | POA: Insufficient documentation

## 2018-08-10 DIAGNOSIS — R112 Nausea with vomiting, unspecified: Secondary | ICD-10-CM | POA: Insufficient documentation

## 2018-08-10 LAB — CBC WITH DIFFERENTIAL/PLATELET
Abs Immature Granulocytes: 0.01 10*3/uL (ref 0.00–0.07)
Basophils Absolute: 0 10*3/uL (ref 0.0–0.1)
Basophils Relative: 1 %
Eosinophils Absolute: 0.1 10*3/uL (ref 0.0–0.5)
Eosinophils Relative: 2 %
HCT: 37.5 % — ABNORMAL LOW (ref 39.0–52.0)
Hemoglobin: 12.6 g/dL — ABNORMAL LOW (ref 13.0–17.0)
Immature Granulocytes: 0 %
Lymphocytes Relative: 33 %
Lymphs Abs: 1.4 10*3/uL (ref 0.7–4.0)
MCH: 29.4 pg (ref 26.0–34.0)
MCHC: 33.6 g/dL (ref 30.0–36.0)
MCV: 87.4 fL (ref 80.0–100.0)
Monocytes Absolute: 0.5 10*3/uL (ref 0.1–1.0)
Monocytes Relative: 11 %
Neutro Abs: 2.3 10*3/uL (ref 1.7–7.7)
Neutrophils Relative %: 53 %
Platelets: 245 10*3/uL (ref 150–400)
RBC: 4.29 MIL/uL (ref 4.22–5.81)
RDW: 15 % (ref 11.5–15.5)
WBC: 4.3 10*3/uL (ref 4.0–10.5)
nRBC: 0 % (ref 0.0–0.2)

## 2018-08-10 LAB — COMPREHENSIVE METABOLIC PANEL
ALT: 101 U/L — ABNORMAL HIGH (ref 0–44)
AST: 142 U/L — ABNORMAL HIGH (ref 15–41)
Albumin: 3.8 g/dL (ref 3.5–5.0)
Alkaline Phosphatase: 60 U/L (ref 38–126)
Anion gap: 11 (ref 5–15)
BUN: 5 mg/dL — ABNORMAL LOW (ref 6–20)
CO2: 23 mmol/L (ref 22–32)
Calcium: 9.2 mg/dL (ref 8.9–10.3)
Chloride: 105 mmol/L (ref 98–111)
Creatinine, Ser: 0.65 mg/dL (ref 0.61–1.24)
GFR calc Af Amer: >60 mL/min (ref >60)
GFR calc non Af Amer: >60 mL/min (ref >60)
Glucose, Bld: 87 mg/dL (ref 70–99)
Potassium: 3.7 mmol/L (ref 3.5–5.1)
Sodium: 139 mmol/L (ref 135–145)
Total Bilirubin: 0.8 mg/dL (ref 0.3–1.2)
Total Protein: 7.1 g/dL (ref 6.5–8.1)

## 2018-08-10 LAB — LIPASE, BLOOD: Lipase: 53 U/L — ABNORMAL HIGH (ref 11–51)

## 2018-08-10 MED ORDER — ONDANSETRON HCL 4 MG/2ML IJ SOLN
4.0000 mg | Freq: Once | INTRAMUSCULAR | Status: AC
  Administered 2018-08-10: 4 mg via INTRAVENOUS
  Filled 2018-08-10: qty 2

## 2018-08-10 MED ORDER — SODIUM CHLORIDE 0.9 % IV BOLUS
500.0000 mL | Freq: Once | INTRAVENOUS | Status: AC
  Administered 2018-08-10: 500 mL via INTRAVENOUS

## 2018-08-10 NOTE — ED Provider Notes (Addendum)
[facility] Provider Note   CSN: [id number] Arrival date & time: 08/10/18  1100     History   Chief Complaint Chief Complaint  Patient presents with  . Abdominal Pain    HPI Samuel HockeyRobert [facility] is a 53 y.o. male.     HPI Patient presents concern of abdominal pain and nosebleed. Patient has history of hypertension, though he notes he has not taken his medication in some time He states that he is generally well, though he has recently been dealing with his increasingly frequent and severe pain about his mid abdomen around the site of prior hernia repair. Pain is sharp nonradiating in the upper abdomen with no vomiting, though there is nausea. Pain is actually focally not where he identifies area of concern for hernia, but more superior. No diarrhea and he continues to have bowel movements normally.  Past Medical History:  Diagnosis Date  . Hypertension     Patient Active Problem List   Diagnosis Date Noted  . Acute respiratory failure with hypoxia (HCC) 11/23/2015  . Normochromic normocytic anemia 11/23/2015  . Acute respiratory failure (HCC) 11/23/2015  . Lobar pneumonia, unspecified organism (HCC)   . Bronchitis 11/22/2015    History reviewed. No pertinent surgical history.      Home Medications    Prior to Admission medications   Medication Sig Start Date End Date Taking? Authorizing Provider  acetaminophen (TYLENOL) 325 MG tablet Take 2 tablets (650 mg total) by mouth every 6 (six) hours as needed for mild pain (or Fever >/= 101). 11/24/15   Alison Murrayevine, Alma M, MD  albuterol (PROVENTIL HFA;VENTOLIN HFA) 108 (90 Base) MCG/ACT inhaler Inhale 2 puffs into the lungs every 6 (six) hours as needed for wheezing or shortness of breath. 11/24/15   Alison Murrayevine, Alma M, MD  levofloxacin (LEVAQUIN) 750 MG tablet Take 1 tablet (750 mg total) by mouth daily at 6 PM. 11/24/15   Alison Murrayevine, Alma M, MD  methocarbamol (ROBAXIN) 500 MG tablet Take 1 tablet (500  mg total) by mouth every 8 (eight) hours as needed for muscle spasms. 11/24/15   Alison Murrayevine, Alma M, MD    Family History Family History  Problem Relation Age of Onset  . Diabetes Mellitus II Neg Hx     Social History Social History   Tobacco Use  . Smoking status: Current Every Day Smoker    Packs/day: 1.00    Years: 30.00    Pack years: 30.00  . Smokeless tobacco: Never Used  Substance Use Topics  . Alcohol use: Yes    Alcohol/week: 24.0 standard drinks    Types: 24 Cans of beer per week    Comment: 6 cans of beer per day after getting off work  . Drug use: Never     Allergies   Patient has no known allergies.   Review of Systems Review of Systems  Constitutional:       Per HPI, otherwise negative  HENT:       Per HPI, otherwise negative  Respiratory:       Per HPI, otherwise negative  Cardiovascular:       Per HPI, otherwise negative  Gastrointestinal: Positive for abdominal pain. Negative for vomiting.  Endocrine:       Negative aside from HPI  Genitourinary:       Neg aside from HPI   Musculoskeletal:       Per HPI, otherwise negative  Skin: Negative.   Neurological: Negative for syncope.  Physical Exam Updated Vital Signs BP 137/83 (BP Location: Right Arm)   Pulse 90   Temp 98.4 F (36.9 C) (Oral)   Resp 16   Ht 5\' 9"  (1.753 m)   Wt 85.7 kg   SpO2 94%   BMI 27.91 kg/m   Physical Exam Vitals signs and nursing note reviewed.  Constitutional:      General: He is not in acute distress.    Appearance: He is well-developed.  HENT:     Head: Normocephalic and atraumatic.     Comments: No nosebleed Eyes:     Conjunctiva/sclera: Conjunctivae normal.  Cardiovascular:     Rate and Rhythm: Normal rate and regular rhythm.  Pulmonary:     Effort: Pulmonary effort is normal. No respiratory distress.     Breath sounds: No stridor.  Abdominal:     General: There is no distension.     Comments: Minimal tenderness to palpation epigastrium, no  peritoneal findings.  Skin:    General: Skin is warm and dry.  Neurological:     Mental Status: He is alert and oriented to person, place, and time.  Psychiatric:        Mood and Affect: Mood is anxious.      ED Treatments / Results  Labs (all labs ordered are listed, but only abnormal results are displayed) Labs Reviewed  COMPREHENSIVE METABOLIC PANEL - Abnormal; Notable for the following components:      Result Value   BUN 5 (*)    AST 142 (*)    ALT 101 (*)    All other components within normal limits  CBC WITH DIFFERENTIAL/PLATELET - Abnormal; Notable for the following components:   Hemoglobin 12.6 (*)    HCT 37.5 (*)    All other components within normal limits  LIPASE, BLOOD - Abnormal; Notable for the following components:   Lipase 53 (*)    All other components within normal limits    EKG    Radiology Dg Abd Acute W/chest  Result Date: 08/10/2018 CLINICAL DATA:  Umbilical pain for 2 weeks, vomiting today. EXAM: DG ABDOMEN ACUTE W/ 1V CHEST COMPARISON:  Chest x-ray dated 11/22/2015. FINDINGS: Single-view of the chest: Heart size and mediastinal contours are within normal limits. Lungs are clear. No pleural effusion or pneumothorax seen. Osseous structures about the chest are unremarkable. Supine and upright views of the abdomen: Bowel gas pattern is nonobstructive. No evidence of soft tissue mass or abnormal fluid collection. No evidence of free intraperitoneal air. No acute or suspicious osseous finding. Degenerative changes at the lumbosacral junction, mild to moderate in degree. IMPRESSION: 1. No evidence of acute cardiopulmonary abnormality. No evidence of pneumonia or pulmonary edema. 2. Nonobstructive bowel gas pattern and no evidence of acute intra-abdominal abnormality. Electronically Signed   By: Bary RichardStan  Maynard M.D.   On: 08/10/2018 11:56    Procedures Procedures (including critical care time)  Medications Ordered in ED Medications  sodium chloride 0.9 %  bolus 500 mL (has no administration in time range)  ondansetron (ZOFRAN) injection 4 mg (has no administration in time range)     Initial Impression / Assessment and Plan / ED Course  I have reviewed the triage vital signs and the nursing notes.  Pertinent labs & imaging results that were available during my care of the patient were reviewed by me and considered in my medical decision making (see chart for details).   On repeat exam patient is awake, alert, ambulatory, anxious to leave.  We discussed  all findings including generally reassuring labs, aside from mild elevation in lipase, liver enzymes Given the patient's acknowledgment of drinking alcohol, we discussed importance of abstinence, as well as a clear liquid diet for several days He has had no recurrence of his nosebleed, and with a soft, otherwise non-peritoneal abdomen, unremarkable hernia, reassuring x-ray, there is little evidence for hernia adding complication to his abdominal pain which is likely due at least in part to his mild pancreatitis versus gastritis. Patient discharged in stable condition.  Final Clinical Impressions(s) / ED Diagnoses   Final diagnoses:  Epigastric pain     Carmin Muskrat, MD 08/11/18 [id number]    Carmin Muskrat, MD 08/22/18 [phone]

## 2018-08-10 NOTE — ED Triage Notes (Signed)
Pt arrives POV from home c/o abdominal pain, mainly in the center of his abdomen and nosebleed this morning. Pt reports having a hernia repair 15 years ago, and states he has a "knot" in his abdomen and chest. Pt denies having CP; pt reports n/v this morning.

## 2018-08-10 NOTE — ED Notes (Signed)
Patient verbalizes understanding of discharge instructions. Opportunity for questioning and answers were provided. Armband removed by staff, pt discharged from ED.  

## 2018-08-10 NOTE — ED Notes (Signed)
Patient transported to X-ray 

## 2018-08-10 NOTE — Discharge Instructions (Signed)
Please be sure to drink only clear liquids for the next 3 days, monitor your condition carefully and do not hesitate to return here for concerning changes.

## 2018-08-10 NOTE — ED Notes (Signed)
ED Provider at bedside. 

## 2019-06-21 ENCOUNTER — Observation Stay (HOSPITAL_COMMUNITY): Payer: Self-pay

## 2019-06-21 ENCOUNTER — Inpatient Hospital Stay (HOSPITAL_COMMUNITY)
Admission: EM | Admit: 2019-06-21 | Discharge: 2019-06-24 | DRG: 378 | Disposition: A | Discharge status: Home / Self Care | Payer: Self-pay | Attending: Internal Medicine | Admitting: Internal Medicine

## 2019-06-21 ENCOUNTER — Other Ambulatory Visit: Payer: Self-pay

## 2019-06-21 DIAGNOSIS — F17210 Nicotine dependence, cigarettes, uncomplicated: Secondary | ICD-10-CM | POA: Diagnosis present

## 2019-06-21 DIAGNOSIS — K2970 Gastritis, unspecified, without bleeding: Secondary | ICD-10-CM

## 2019-06-21 DIAGNOSIS — I10 Essential (primary) hypertension: Secondary | ICD-10-CM | POA: Diagnosis present

## 2019-06-21 DIAGNOSIS — K921 Melena: Secondary | ICD-10-CM

## 2019-06-21 DIAGNOSIS — K2990 Gastroduodenitis, unspecified, without bleeding: Secondary | ICD-10-CM

## 2019-06-21 DIAGNOSIS — F1010 Alcohol abuse, uncomplicated: Secondary | ICD-10-CM

## 2019-06-21 DIAGNOSIS — E876 Hypokalemia: Secondary | ICD-10-CM | POA: Diagnosis present

## 2019-06-21 DIAGNOSIS — F1020 Alcohol dependence, uncomplicated: Secondary | ICD-10-CM

## 2019-06-21 DIAGNOSIS — F10239 Alcohol dependence with withdrawal, unspecified: Secondary | ICD-10-CM | POA: Diagnosis present

## 2019-06-21 DIAGNOSIS — K760 Fatty (change of) liver, not elsewhere classified: Secondary | ICD-10-CM | POA: Diagnosis present

## 2019-06-21 DIAGNOSIS — F10939 Alcohol use, unspecified with withdrawal, unspecified: Secondary | ICD-10-CM

## 2019-06-21 DIAGNOSIS — Z720 Tobacco use: Secondary | ICD-10-CM

## 2019-06-21 DIAGNOSIS — Y905 Blood alcohol level of 100-119 mg/100 ml: Secondary | ICD-10-CM

## 2019-06-21 DIAGNOSIS — D649 Anemia, unspecified: Secondary | ICD-10-CM

## 2019-06-21 DIAGNOSIS — Z20822 Contact with and (suspected) exposure to covid-19: Secondary | ICD-10-CM | POA: Diagnosis present

## 2019-06-21 DIAGNOSIS — K31811 Angiodysplasia of stomach and duodenum with bleeding: Secondary | ICD-10-CM | POA: Diagnosis present

## 2019-06-21 DIAGNOSIS — K2921 Alcoholic gastritis with bleeding: Principal | ICD-10-CM | POA: Diagnosis present

## 2019-06-21 DIAGNOSIS — R112 Nausea with vomiting, unspecified: Secondary | ICD-10-CM

## 2019-06-21 DIAGNOSIS — K709 Alcoholic liver disease, unspecified: Secondary | ICD-10-CM | POA: Diagnosis present

## 2019-06-21 DIAGNOSIS — D509 Iron deficiency anemia, unspecified: Secondary | ICD-10-CM | POA: Diagnosis present

## 2019-06-21 DIAGNOSIS — D62 Acute posthemorrhagic anemia: Secondary | ICD-10-CM | POA: Diagnosis present

## 2019-06-21 DIAGNOSIS — K920 Hematemesis: Secondary | ICD-10-CM

## 2019-06-21 DIAGNOSIS — K5520 Angiodysplasia of colon without hemorrhage: Secondary | ICD-10-CM

## 2019-06-21 DIAGNOSIS — R7401 Elevation of levels of liver transaminase levels: Secondary | ICD-10-CM

## 2019-06-21 LAB — COMPREHENSIVE METABOLIC PANEL
ALT: 43 U/L (ref 0–44)
AST: 106 U/L — ABNORMAL HIGH (ref 15–41)
Albumin: 3.4 g/dL — ABNORMAL LOW (ref 3.5–5.0)
Alkaline Phosphatase: 98 U/L (ref 38–126)
Anion gap: 13 (ref 5–15)
BUN: <5 mg/dL — ABNORMAL LOW (ref 6–20)
CO2: 26 mmol/L (ref 22–32)
Calcium: 8.8 mg/dL — ABNORMAL LOW (ref 8.9–10.3)
Chloride: 98 mmol/L (ref 98–111)
Creatinine, Ser: 0.67 mg/dL (ref 0.61–1.24)
GFR calc Af Amer: >60 mL/min (ref >60)
GFR calc non Af Amer: >60 mL/min (ref >60)
Glucose, Bld: 135 mg/dL — ABNORMAL HIGH (ref 70–99)
Potassium: 3.5 mmol/L (ref 3.5–5.1)
Sodium: 137 mmol/L (ref 135–145)
Total Bilirubin: 1.1 mg/dL (ref 0.3–1.2)
Total Protein: 7.8 g/dL (ref 6.5–8.1)

## 2019-06-21 LAB — RAPID URINE DRUG SCREEN, HOSP PERFORMED
Amphetamines: NOT DETECTED
Barbiturates: NOT DETECTED
Benzodiazepines: NOT DETECTED
Cocaine: NOT DETECTED
Opiates: NOT DETECTED
Tetrahydrocannabinol: NOT DETECTED

## 2019-06-21 LAB — CBC
HCT: 27.2 % — ABNORMAL LOW (ref 39.0–52.0)
HCT: 25.8 % — ABNORMAL LOW (ref 39.0–52.0)
Hemoglobin: 8 g/dL — ABNORMAL LOW (ref 13.0–17.0)
Hemoglobin: 7.6 g/dL — ABNORMAL LOW (ref 13.0–17.0)
MCH: 24.1 pg — ABNORMAL LOW (ref 26.0–34.0)
MCH: 23.8 pg — ABNORMAL LOW (ref 26.0–34.0)
MCHC: 29.4 g/dL — ABNORMAL LOW (ref 30.0–36.0)
MCHC: 29.5 g/dL — ABNORMAL LOW (ref 30.0–36.0)
MCV: 81.9 fL (ref 80.0–100.0)
MCV: 80.9 fL (ref 80.0–100.0)
Platelets: 228 10*3/uL (ref 150–400)
Platelets: 212 10*3/uL (ref 150–400)
RBC: 3.32 MIL/uL — ABNORMAL LOW (ref 4.22–5.81)
RBC: 3.19 MIL/uL — ABNORMAL LOW (ref 4.22–5.81)
RDW: 19.7 % — ABNORMAL HIGH (ref 11.5–15.5)
RDW: 19.7 % — ABNORMAL HIGH (ref 11.5–15.5)
WBC: 6 10*3/uL (ref 4.0–10.5)
WBC: 4.4 10*3/uL (ref 4.0–10.5)
nRBC: 0 % (ref 0.0–0.2)
nRBC: 0.5 % — ABNORMAL HIGH (ref 0.0–0.2)

## 2019-06-21 LAB — IRON AND TIBC
Iron: 35 ug/dL — ABNORMAL LOW (ref 45–182)
Saturation Ratios: 8 % — ABNORMAL LOW (ref 17.9–39.5)
TIBC: 438 ug/dL (ref 250–450)
UIBC: 403 ug/dL

## 2019-06-21 LAB — URINALYSIS, ROUTINE W REFLEX MICROSCOPIC
Bilirubin Urine: NEGATIVE
Glucose, UA: NEGATIVE mg/dL
Hgb urine dipstick: NEGATIVE
Ketones, ur: NEGATIVE mg/dL
Leukocytes,Ua: NEGATIVE
Nitrite: NEGATIVE
Protein, ur: NEGATIVE mg/dL
Specific Gravity, Urine: 1.012 (ref 1.005–1.030)
pH: 8 (ref 5.0–8.0)

## 2019-06-21 LAB — PROTIME-INR
INR: 1 (ref 0.8–1.2)
Prothrombin Time: 12.6 seconds (ref 11.4–15.2)

## 2019-06-21 LAB — RETICULOCYTES
Immature Retic Fract: 36.9 % — ABNORMAL HIGH (ref 2.3–15.9)
RBC.: 3.14 MIL/uL — ABNORMAL LOW (ref 4.22–5.81)
Retic Count, Absolute: 65.9 10*3/uL (ref 19.0–186.0)
Retic Ct Pct: 2.1 % (ref 0.4–3.1)

## 2019-06-21 LAB — MAGNESIUM: Magnesium: 1.4 mg/dL — ABNORMAL LOW (ref 1.7–2.4)

## 2019-06-21 LAB — SARS CORONAVIRUS 2 (TAT 6-24 HRS): SARS Coronavirus 2: NEGATIVE

## 2019-06-21 LAB — PREPARE RBC (CROSSMATCH)

## 2019-06-21 LAB — POC OCCULT BLOOD, ED: Fecal Occult Bld: NEGATIVE

## 2019-06-21 LAB — ABO/RH: ABO/RH(D): A POS

## 2019-06-21 LAB — FERRITIN: Ferritin: 20 ng/mL — ABNORMAL LOW (ref 24–336)

## 2019-06-21 LAB — ETHANOL: Alcohol, Ethyl (B): 117 mg/dL — ABNORMAL HIGH (ref <10)

## 2019-06-21 LAB — FOLATE: Folate: 6.7 ng/mL (ref >5.9)

## 2019-06-21 LAB — VITAMIN B12: Vitamin B-12: 434 pg/mL (ref 180–914)

## 2019-06-21 LAB — HIV ANTIBODY (ROUTINE TESTING W REFLEX): HIV Screen 4th Generation wRfx: NONREACTIVE

## 2019-06-21 MED ORDER — LORAZEPAM 2 MG/ML IJ SOLN
2.0000 mg | Freq: Once | INTRAMUSCULAR | Status: AC
  Administered 2019-06-21: 2 mg via INTRAVENOUS
  Filled 2019-06-21: qty 1

## 2019-06-21 MED ORDER — FOLIC ACID 1 MG PO TABS
1.0000 mg | ORAL_TABLET | Freq: Every day | ORAL | Status: DC
  Administered 2019-06-21 – 2019-06-24 (×4): 1 mg via ORAL
  Filled 2019-06-21 (×4): qty 1

## 2019-06-21 MED ORDER — ONDANSETRON 4 MG PO TBDP: 8.0000 mg | ORAL_TABLET | Freq: Three times a day (TID) | ORAL | Status: DC | PRN

## 2019-06-21 MED ORDER — ACETAMINOPHEN 650 MG RE SUPP: 650.0000 mg | Freq: Four times a day (QID) | RECTAL | Status: DC | PRN

## 2019-06-21 MED ORDER — FERROUS SULFATE 325 (65 FE) MG PO TABS
325.0000 mg | ORAL_TABLET | Freq: Every day | ORAL | Status: DC
  Administered 2019-06-21 – 2019-06-22 (×2): 325 mg via ORAL
  Filled 2019-06-21 (×2): qty 1

## 2019-06-21 MED ORDER — LORAZEPAM 1 MG PO TABS: 0.0000 mg | ORAL_TABLET | Freq: Four times a day (QID) | ORAL | Status: DC

## 2019-06-21 MED ORDER — LORAZEPAM 1 MG PO TABS: 0.0000 mg | ORAL_TABLET | Freq: Two times a day (BID) | ORAL | Status: DC

## 2019-06-21 MED ORDER — PANTOPRAZOLE SODIUM 40 MG IV SOLR
40.0000 mg | Freq: Once | INTRAVENOUS | Status: AC
  Administered 2019-06-21: 14:00:00 40 mg via INTRAVENOUS
  Filled 2019-06-21: qty 40

## 2019-06-21 MED ORDER — LORAZEPAM 2 MG/ML IJ SOLN
0.0000 mg | Freq: Four times a day (QID) | INTRAMUSCULAR | Status: DC
  Administered 2019-06-21: 16:00:00 1 mg via INTRAVENOUS
  Filled 2019-06-21: qty 1

## 2019-06-21 MED ORDER — MAGNESIUM SULFATE 2 GM/50ML IV SOLN
2.0000 g | Freq: Once | INTRAVENOUS | Status: AC
  Administered 2019-06-21: 16:00:00 2 g via INTRAVENOUS
  Filled 2019-06-21: qty 50

## 2019-06-21 MED ORDER — THIAMINE HCL 100 MG PO TABS
100.0000 mg | ORAL_TABLET | Freq: Every day | ORAL | Status: DC
  Administered 2019-06-22 – 2019-06-24 (×3): 100 mg via ORAL
  Filled 2019-06-21 (×3): qty 1

## 2019-06-21 MED ORDER — SODIUM CHLORIDE 0.9% IV SOLUTION: Freq: Once | INTRAVENOUS | Status: DC

## 2019-06-21 MED ORDER — SODIUM CHLORIDE 0.9% FLUSH
3.0000 mL | Freq: Two times a day (BID) | INTRAVENOUS | Status: DC
  Administered 2019-06-22 – 2019-06-24 (×5): 3 mL via INTRAVENOUS

## 2019-06-21 MED ORDER — LORAZEPAM 2 MG/ML IJ SOLN
2.0000 mg | Freq: Once | INTRAMUSCULAR | Status: AC
  Administered 2019-06-21: 14:00:00 2 mg via INTRAVENOUS
  Filled 2019-06-21: qty 1

## 2019-06-21 MED ORDER — SODIUM CHLORIDE 0.9 % IV SOLN
8.0000 mg | Freq: Four times a day (QID) | INTRAVENOUS | Status: DC | PRN
  Filled 2019-06-21: qty 4

## 2019-06-21 MED ORDER — POLYETHYLENE GLYCOL 3350 17 G PO PACK: 17.0000 g | PACK | Freq: Every day | ORAL | Status: DC | PRN

## 2019-06-21 MED ORDER — ADULT MULTIVITAMIN W/MINERALS CH
1.0000 | ORAL_TABLET | Freq: Every day | ORAL | Status: DC
  Administered 2019-06-21 – 2019-06-24 (×4): 1 via ORAL
  Filled 2019-06-21 (×4): qty 1

## 2019-06-21 MED ORDER — ACETAMINOPHEN 325 MG PO TABS
650.0000 mg | ORAL_TABLET | Freq: Four times a day (QID) | ORAL | Status: DC | PRN
  Administered 2019-06-22: 650 mg via ORAL
  Filled 2019-06-21: qty 2

## 2019-06-21 MED ORDER — THIAMINE HCL 100 MG PO TABS: 100.0000 mg | ORAL_TABLET | Freq: Every day | ORAL | Status: DC

## 2019-06-21 MED ORDER — NICOTINE 21 MG/24HR TD PT24
21.0000 mg | MEDICATED_PATCH | Freq: Every day | TRANSDERMAL | Status: DC
  Administered 2019-06-21 – 2019-06-24 (×4): 21 mg via TRANSDERMAL
  Filled 2019-06-21 (×5): qty 1

## 2019-06-21 MED ORDER — LORAZEPAM 2 MG/ML IJ SOLN: 0.0000 mg | Freq: Two times a day (BID) | INTRAMUSCULAR | Status: DC

## 2019-06-21 MED ORDER — THIAMINE HCL 100 MG/ML IJ SOLN
100.0000 mg | Freq: Once | INTRAMUSCULAR | Status: AC
  Administered 2019-06-21: 14:00:00 100 mg via INTRAVENOUS
  Filled 2019-06-21: qty 2

## 2019-06-21 MED ORDER — PANTOPRAZOLE SODIUM 40 MG IV SOLR
40.0000 mg | Freq: Two times a day (BID) | INTRAVENOUS | Status: DC
  Administered 2019-06-21 – 2019-06-23 (×4): 40 mg via INTRAVENOUS
  Filled 2019-06-21 (×4): qty 40

## 2019-06-21 MED ORDER — THIAMINE HCL 100 MG/ML IJ SOLN: 100.0000 mg | Freq: Every day | INTRAMUSCULAR | Status: DC

## 2019-06-21 NOTE — ED Provider Notes (Signed)
[facility] EMERGENCY DEPARTMENT Provider Note   CSN: [id number] Arrival date & time: 06/21/19  4193     History Chief Complaint  Patient presents with  . Alcohol Problem    Samuel [facility] is a 54 y.o. male.  The history is provided by the patient. No language interpreter was used.  Alcohol Problem   Samuel [facility] is a 54 y.o. male who presents to the Emergency Department complaining of hall detox. [facility]. He states that he has been drinking alcohol for about 40 years. He drinks 1/5 and a pint of liquor daily. He did not drink liquor today because the liquor store was closed. He did purchase wine but vomited that up. He has vomited twice this morning. The first episode look like liquid in the second episode had some blood mixed in. He reports chronic diarrhea for the last year describes his stools as chocolate in color. He has 1 to 2 bowel movements daily. He denies any fevers, chest pain, shortness of breath, abdominal pain. He denies any SI, HI, hallucinations. He is requesting help with his alcohol use. He denies any street drug use.    Past Medical History:  Diagnosis Date  . Hypertension     Patient Active Problem List   Diagnosis Date Noted  . Acute respiratory failure with hypoxia ([place]) 11/23/2015  . Normochromic normocytic anemia 11/23/2015  . Acute respiratory failure ([place]) 11/23/2015  . Lobar pneumonia, unspecified organism ([place])   . Bronchitis 11/22/2015    No past surgical history on file.     Family History  Problem Relation Age of Onset  . Diabetes Mellitus II Neg Hx     Social History   Tobacco Use  . Smoking status: Current Every Day Smoker    Packs/day: 1.00    Years: 30.00    Pack years: 30.00  . Smokeless tobacco: Never Used  Substance Use Topics  . Alcohol use: Yes    Alcohol/week: 24.0 standard drinks    Types: 24 Cans of beer per week   Comment: 6 cans of beer per day after getting off work  . Drug use: Never    Home Medications Prior to Admission medications   Medication Sig Start Date End Date Taking? Authorizing Provider  acetaminophen (TYLENOL) 325 MG tablet Take 2 tablets (650 mg total) by mouth every 6 (six) hours as needed for mild pain (or Fever >/= 101). 11/24/15   Robbie Lis, MD  albuterol (PROVENTIL HFA;VENTOLIN HFA) 108 (90 Base) MCG/ACT inhaler Inhale 2 puffs into the lungs every 6 (six) hours as needed for wheezing or shortness of breath. 11/24/15   Robbie Lis, MD  levofloxacin (LEVAQUIN) 750 MG tablet Take 1 tablet (750 mg total) by mouth daily at 6 PM. 11/24/15   Robbie Lis, MD  methocarbamol (ROBAXIN) 500 MG tablet Take 1 tablet (500 mg total) by mouth every 8 (eight) hours as needed for muscle spasms. 11/24/15   Robbie Lis, MD    Allergies    Patient has no known allergies.  Review of Systems   Review of Systems  All other systems reviewed and are negative.   Physical Exam Updated Vital Signs BP 139/77 (BP Location: Right Arm)   Pulse (!) 111   Temp 98.3 F (36.8 C) (Oral)   Resp 20   SpO2 100%   Physical Exam Vitals and nursing note reviewed.  Constitutional:  Appearance: He is well-developed.  HENT:     Head: Normocephalic and atraumatic.  Cardiovascular:     Rate and Rhythm: Regular rhythm. Tachycardia present.     Heart sounds: No murmur.  Pulmonary:     Effort: Pulmonary effort is normal. No respiratory distress.     Breath sounds: Normal breath sounds.  Abdominal:     Palpations: Abdomen is soft.     Tenderness: There is no abdominal tenderness. There is no guarding or rebound.  Genitourinary:    Comments: nontender rectal exam, empty rectal vault.  Musculoskeletal:        General: No swelling or tenderness.  Skin:    General: Skin is warm and dry.  Neurological:     Mental Status: He is alert and oriented to person, place, and time.     Comments: Tremulous  with resting tremor of BUE  Psychiatric:        Behavior: Behavior normal.     ED Results / Procedures / Treatments   Labs (all labs ordered are listed, but only abnormal results are displayed) Labs Reviewed  COMPREHENSIVE METABOLIC PANEL - Abnormal; Notable for the following components:      Result Value   Glucose, Bld 135 (*)    BUN <5 (*)    Calcium 8.8 (*)    Albumin 3.4 (*)    AST 106 (*)    All other components within normal limits  ETHANOL - Abnormal; Notable for the following components:   Alcohol, Ethyl (B) 117 (*)    All other components within normal limits  CBC - Abnormal; Notable for the following components:   RBC 3.32 (*)    Hemoglobin 8.0 (*)    HCT 27.2 (*)    MCH 24.1 (*)    MCHC 29.4 (*)    RDW 19.7 (*)    All other components within normal limits  RAPID URINE DRUG SCREEN, HOSP PERFORMED  PROTIME-INR  TYPE AND SCREEN    EKG None  Radiology No results found.  Procedures Procedures (including critical care time)  Medications Ordered in ED Medications  LORazepam (ATIVAN) injection 2 mg (has no administration in time range)  pantoprazole (PROTONIX) injection 40 mg (has no administration in time range)  thiamine (B-1) injection 100 mg (has no administration in time range)    ED Course  I have reviewed the triage vital signs and the nursing notes.  Pertinent labs & imaging results that were available during my care of the patient were reviewed by me and considered in my medical decision making (see chart for details).    MDM Rules/Calculators/A&P                     Patient with history of alcohol abuse here for evaluation for detox, vomiting this morning. He is tremulous on examination but non-toxic appearing. He was treated with Ativan with improvement in his tremors but he does have persistent tremor on recheck. He did have one episode of blood in emesis today prior to arrival. Hemoglobin is decreased when compared to priors but he has  experienced significant anemia in the past. He does not have recent hemoglobin's for comparison. He was treated with Protonix. Plan to admit for observation for alcohol withdrawal and possible G.I. bleed. Patient updated of findings of studies recommendation for admission and he is in agreement with treatment plan.  Final Clinical Impression(s) / ED Diagnoses Final diagnoses:  None    Rx / DC Orders ED Discharge  Orders    None       Tilden Fossa, MD 06/21/19 1512

## 2019-06-21 NOTE — ED Notes (Signed)
Pt transported to ultrasound.

## 2019-06-21 NOTE — H&P (Signed)
Date: 06/21/2019               Patient Name:  Samuel Blevins MRN: [id number]  DOB: 03/17/1965 Age / Sex: 54 y.o., male   PCP: Patient, No Pcp Per         Medical Service: Internal Medicine Teaching Service         Attending Physician: Dr. Inez Catalina, MD    First Contact: Dr. Ronelle Nigh Pager: [phone]  Second Contact: Dr. Nedra Hai Pager: [phone]       After Hours (After 5p/  First Contact Pager: [phone]  weekends / holidays): Second Contact Pager: [phone]   Chief Complaint: Vomiting, alcohol detoxification  History of Present Illness:  Patient is a 54 year old male with past medical history significant for hypertension, alcohol use disorder who presented to ER for alcohol detox and evaluation of vomiting.  Patient reports that he routinely vomits every morning when he has not had a drink recently.  Patient states he has had increased vomiting over the past 2 days, occurring 3-4 times, last episode this morning.  Reports associate abdominal pain, no current abdominal pain.  Patient reports with the last episode he had a small amount of red blood in his emesis. Patient denies development of chest pain or shortness of breath.  Patient denies past history of GI bleed requiring hospitalization.  Patient states he had dark-colored stool this morning.  Patient also reports fatigue and weight loss of approximately 40 pounds over the past few months. Patient states he drinks about 1250 ml of liquor daily, reports heavy alcohol usage over the past 40 years, longest reported period of sobriety less than 1 day.  Patient denies history of withdrawals.  On review of systems, patient reports pain with urination, starting this morning.  Meds:  *Reports taking no medications  Allergies: * Patient without known allergies  Past Medical History: Hypertension  Family History: Patient states he is unaware of any family history of hypertension, diabetes, or cancer  Social History:  *Reports 1250 ml  liquor use daily *Reports 1 PPD cigarettes usage over past 20 years *Denies recreational drug use *Sleeps at shelter occasionally, often sleeps on street  Review of Systems: A complete ROS was negative except as per HPI.  Physical Exam: Blood pressure 140/87, pulse 94, temperature 98.3 F (36.8 C), temperature source Oral, resp. rate 19, SpO2 96 %. Physical Exam  Constitutional: He is well-developed, well-nourished, and in no distress.  Somnolent  HENT:  Head: Normocephalic and atraumatic.  Eyes: EOM are normal. Right eye exhibits no discharge. Left eye exhibits no discharge.  Neck: No tracheal deviation present.  Cardiovascular: Normal rate and regular rhythm. Exam reveals no gallop and no friction rub.  No murmur heard. Pulmonary/Chest: Effort normal and breath sounds normal. No respiratory distress. He has no wheezes. He has no rales.  Abdominal: Soft. He exhibits no distension. There is no abdominal tenderness. There is no rebound and no guarding.  Musculoskeletal:        General: No tenderness, deformity or edema. Normal range of motion.     Cervical back: Normal range of motion.  Neurological: He is alert. Coordination normal.  Mild bilateral hand tremor  Skin: Skin is warm and dry. No rash noted. He is not diaphoretic. No erythema.  Psychiatric: Memory and judgment normal.   EKG: personally reviewed my interpretation is no evidence for acute ischemia  CBC Latest Ref Rng & Units 06/21/2019 08/10/2018 11/23/2015  WBC 4.0 - 10.5 K/uL 6.0  4.3 10.7(H)  Hemoglobin 13.0 - 17.0 g/dL 8.0(L) 12.6(L) 9.3(L)  Hematocrit 39.0 - 52.0 % 27.2(L) 37.5(L) 29.0(L)  Platelets 150 - 400 K/uL 228 245 272   CMP Latest Ref Rng & Units 06/21/2019 08/10/2018 11/23/2015  Glucose 70 - 99 mg/dL 135(H) 87 119(H)  BUN 6 - 20 mg/dL <5(L) 5(L) 10  Creatinine 0.61 - 1.24 mg/dL 0.67 0.65 0.61  Sodium 135 - 145 mmol/L 137 139 135  Potassium 3.5 - 5.1 mmol/L 3.5 3.7 3.9  Chloride 98 - 111 mmol/L 98 105 103    CO2 22 - 32 mmol/L 26 23 21(L)  Calcium 8.9 - 10.3 mg/dL 8.8(L) 9.2 9.2  Total Protein 6.5 - 8.1 g/dL 7.8 7.1 8.5(H)  Total Bilirubin 0.3 - 1.2 mg/dL 1.1 0.8 0.4  Alkaline Phos 38 - 126 U/L 98 60 100  AST 15 - 41 U/L 106(H) 142(H) 72(H)  ALT 0 - 44 U/L 43 101(H) 46   Iron Studies (06/21/19): Iron: 35 (low) TIBC: 438 (normal-high) Saturation: 8 (low) UIBC: 403 Ferritin: 20 Reticulocyte count, absolute: 66 Folate: 6.7 Vitamin B12: 434  Assessment & Plan by Problem: Active Problems:   Alcohol use disorder, severe, dependence ([place])  Patient is a 54 year old male with past medical history significant for hypertension who presents requesting alcohol detoxification in the setting of 2 days of increased nausea, vomiting.  # Alcohol use disorder # Alcoholic liver disease Patient with heavy, chronic alcohol use, has risk for withdrawals although no history of withdrawals given continuous usage.  Alcohol of 117 on presentation, UDS negative.  Patient's LFT abnormalities (AST 106, ALT 43) are consistent with alcoholic liver disease.  Laboratory studies demonstrate mostly preserved synthetic function with INR 1.0, platelets of 228, albumin is mildly low at 3.4.  On exam, patient has mild tremor but has no other significant withdrawal symptoms. *CIWA without Ativan *Compelte abdominal ultrasound to evaluate for sequelae *Thiamine+Folate+Multivitamin  # Anemia # Hematemesis Patient reports 1 episode of small amount of blood in his vomit this morning.  Patient reports history of occasional blood in vomit, no history of significant past GI bleed.  Patient also reports 1 day of dark stools, FOBT on presentation was negative.  Hemoglobin of 8.0 on presentation, from 12.6 in June 2020.  Patient previously with hemoglobin 9.3 in November 2017.  Unclear if drop is acute versus chronic.  MCV is 81.9. Iron studies consistent with iron deficiency anemia.  Iron studies are consistent with iron deficiency  anemia with low iron (35), normal-high TIBC (438), low saturation (8), and low ferritin of 20. Malignancy should be strongly considered given iron deficiency anemia, patient's age, and weight loss *Received 40 mg IV pantoprazole in ER. Will continue pantoprazole 40 mg IV twice daily, first dose today at 2200 *Will consult GI if patient with continued decline in his hemoglobin.  If hemoglobin remains stable, patient may benefit from outpatient colonoscopy to identify cause of anemia especially given patient's reported weight loss *Ferrous sulfate 325 mg daily  # Tobacco use disorder: Smokes 1 pack/day *Nicotine patch 21 mg daily  # Hypertension: Patient reports past medical history of hypertension, states he was previously on medication for this.  Max BP 160/85, min 135/80 since presentation.  Will need PCP follow-up.  Will hold any antihypertensive therapy given possibility of GI bleed.  Dispo: Admit patient to Observation with expected length of stay less than 2 midnights.  Signed: Jeanmarie Hubert, MD 06/21/2019, 5:07 PM

## 2019-06-21 NOTE — ED Triage Notes (Signed)
Pt sent by [facility] here to get detox from alcohol. Last drink this morning at 0700. Usually drinks a fifth and a pint per day. Denies SI/HI.

## 2019-06-22 DIAGNOSIS — R7401 Elevation of levels of liver transaminase levels: Secondary | ICD-10-CM

## 2019-06-22 DIAGNOSIS — F10939 Alcohol use, unspecified with withdrawal, unspecified: Secondary | ICD-10-CM

## 2019-06-22 DIAGNOSIS — F10239 Alcohol dependence with withdrawal, unspecified: Secondary | ICD-10-CM

## 2019-06-22 DIAGNOSIS — I10 Essential (primary) hypertension: Secondary | ICD-10-CM

## 2019-06-22 DIAGNOSIS — K921 Melena: Secondary | ICD-10-CM

## 2019-06-22 DIAGNOSIS — R112 Nausea with vomiting, unspecified: Secondary | ICD-10-CM

## 2019-06-22 DIAGNOSIS — K920 Hematemesis: Secondary | ICD-10-CM

## 2019-06-22 DIAGNOSIS — D649 Anemia, unspecified: Secondary | ICD-10-CM

## 2019-06-22 DIAGNOSIS — F1020 Alcohol dependence, uncomplicated: Secondary | ICD-10-CM

## 2019-06-22 LAB — CBC
HCT: 26.3 % — ABNORMAL LOW (ref 39.0–52.0)
Hemoglobin: 7.8 g/dL — ABNORMAL LOW (ref 13.0–17.0)
MCH: 24.1 pg — ABNORMAL LOW (ref 26.0–34.0)
MCHC: 29.7 g/dL — ABNORMAL LOW (ref 30.0–36.0)
MCV: 81.2 fL (ref 80.0–100.0)
Platelets: 223 10*3/uL (ref 150–400)
RBC: 3.24 MIL/uL — ABNORMAL LOW (ref 4.22–5.81)
RDW: 19.8 % — ABNORMAL HIGH (ref 11.5–15.5)
WBC: 3.5 10*3/uL — ABNORMAL LOW (ref 4.0–10.5)
nRBC: 0 % (ref 0.0–0.2)

## 2019-06-22 LAB — COMPREHENSIVE METABOLIC PANEL
ALT: 34 U/L (ref 0–44)
AST: 79 U/L — ABNORMAL HIGH (ref 15–41)
Albumin: 3 g/dL — ABNORMAL LOW (ref 3.5–5.0)
Alkaline Phosphatase: 92 U/L (ref 38–126)
Anion gap: 9 (ref 5–15)
BUN: <5 mg/dL — ABNORMAL LOW (ref 6–20)
CO2: 27 mmol/L (ref 22–32)
Calcium: 8.7 mg/dL — ABNORMAL LOW (ref 8.9–10.3)
Chloride: 100 mmol/L (ref 98–111)
Creatinine, Ser: 0.66 mg/dL (ref 0.61–1.24)
GFR calc Af Amer: >60 mL/min (ref >60)
GFR calc non Af Amer: >60 mL/min (ref >60)
Glucose, Bld: 90 mg/dL (ref 70–99)
Potassium: 3.4 mmol/L — ABNORMAL LOW (ref 3.5–5.1)
Sodium: 136 mmol/L (ref 135–145)
Total Bilirubin: 1.5 mg/dL — ABNORMAL HIGH (ref 0.3–1.2)
Total Protein: 7.3 g/dL (ref 6.5–8.1)

## 2019-06-22 LAB — HEPATITIS B SURFACE ANTIGEN: Hepatitis B Surface Ag: NONREACTIVE

## 2019-06-22 LAB — HEPATITIS C ANTIBODY: HCV Ab: NONREACTIVE

## 2019-06-22 MED ORDER — CHLORDIAZEPOXIDE HCL 25 MG PO CAPS
25.0000 mg | ORAL_CAPSULE | Freq: Four times a day (QID) | ORAL | Status: AC
  Administered 2019-06-22 (×4): 25 mg via ORAL
  Filled 2019-06-22 (×4): qty 1

## 2019-06-22 MED ORDER — LORAZEPAM 1 MG PO TABS
1.0000 mg | ORAL_TABLET | ORAL | Status: DC | PRN
  Administered 2019-06-23: 22:00:00 1 mg via ORAL
  Filled 2019-06-22: qty 1

## 2019-06-22 MED ORDER — POTASSIUM CHLORIDE CRYS ER 20 MEQ PO TBCR
40.0000 meq | EXTENDED_RELEASE_TABLET | Freq: Once | ORAL | Status: AC
  Administered 2019-06-22: 40 meq via ORAL
  Filled 2019-06-22: qty 2

## 2019-06-22 MED ORDER — SODIUM CHLORIDE 0.9 % IV SOLN
510.0000 mg | Freq: Once | INTRAVENOUS | Status: AC
  Administered 2019-06-22: 510 mg via INTRAVENOUS
  Filled 2019-06-22: qty 17

## 2019-06-22 MED ORDER — CHLORDIAZEPOXIDE HCL 25 MG PO CAPS
25.0000 mg | ORAL_CAPSULE | Freq: Three times a day (TID) | ORAL | Status: AC
  Administered 2019-06-23 (×2): 25 mg via ORAL
  Filled 2019-06-22 (×2): qty 1

## 2019-06-22 MED ORDER — LORAZEPAM 2 MG/ML IJ SOLN: 1.0000 mg | INTRAMUSCULAR | Status: DC | PRN

## 2019-06-22 MED ORDER — CHLORDIAZEPOXIDE HCL 25 MG PO CAPS
50.0000 mg | ORAL_CAPSULE | Freq: Once | ORAL | Status: AC
  Administered 2019-06-22: 50 mg via ORAL
  Filled 2019-06-22: qty 2

## 2019-06-22 MED ORDER — SODIUM CHLORIDE 0.9 % IV SOLN: 510.0000 mg | Freq: Once | INTRAVENOUS | Status: DC

## 2019-06-22 MED ORDER — CHLORDIAZEPOXIDE HCL 25 MG PO CAPS
25.0000 mg | ORAL_CAPSULE | ORAL | Status: DC
  Administered 2019-06-24: 08:00:00 25 mg via ORAL
  Filled 2019-06-22: qty 1

## 2019-06-22 MED ORDER — CHLORDIAZEPOXIDE HCL 25 MG PO CAPS: 25.0000 mg | ORAL_CAPSULE | Freq: Every day | ORAL | Status: DC

## 2019-06-22 NOTE — ED Notes (Signed)
Text-page sent to provider regarding ciwa scale and no PRN ativan ordered

## 2019-06-22 NOTE — H&P (View-Only) (Signed)
Referring Provider:  [facility]         Primary Care Physician:  Patient, No Pcp Per Primary Gastroenterologist:   Gentry Fitz           We were asked to see this patient for: Hematemesis                ASSESSMENT /  PLAN    54 year old male with PMH significant for tobacco abuse, alcohol abuse, fatty liver disease , hypertension   # Longstanding history of alcohol abuse / Abnormal liver tests.  --Pattern of enzyme elevation compatible with EtOH . No evidence for cirrhosis at this point. Hepatomegaly and fatty liver on [place].  INR normal, platelets normal --MDF below threshold for steroid --Obtain viral hepatitis studies to rule out chronic viral hepatitis.  --On CIWA   # Iron deficiency anemia / reports of hematemesis / chronic dark stool  --Hgb 7.6, down from 12.6 in June 2020.  -- FOBT negative. The amount of blood in emesis yesterday sounds scant but he has taken [facility] powders / Aleve QOD for months so need to rule out erosive disease / PUD. Will arrange for EGD to be done tomorrow. The risks and benefits of EGD were discussed and the patient agrees to proceed.  --Patient reports having had a normal colonoscopy in prison two years ago in [place], [place]. I will ask nursing staff to get report though may prove difficult.  --Continue twice daily IV PPI --Admitting team ordered dose of IV iron --Of note, he does report frequent episodes of epistaxis with perfuse bleeding.   # Mild hypokalemia / hypomagnesemia --Repletion in progress  HPI:    Chief Complaint: Alcoholism, vomiting blood and dark stool  Samuel Blevins is a 54 y.o. male who presented to the ED yesterday to detox from alcohol.  Last drink was yesterday morning at 7 AM.  He has a long history of alcohol abuse.  Yesterday morning patient vomited liquid mixed with blood. He saw the amount of blood was tiny but he did this before a few months ago. He has heavy nosebleeds. He reports chocolate colored liquid stools for months  " ever since he started drinking heavy".  He takes BC powders / Aleve QOD for Etoh related headaches. Denies abdominal pain. No GERD or dysphagia. He reports significant weight loss over last 6 months because he would rather drink than eat. No FMH of colon cancer. Says he had a normal colonoscopy in prison 2 two years ago, done because of his age ( ? Screening).    Data review: Presenting hemoglobin 8.  Not any labs to compare but his baseline hemoglobin is possibly 12.6.  TIBC 438, iron saturation 8%, ferritin 20 , folate 6.7 , B12 434 . WBC 3.5 , MCV 82, platelets 228.  Renal function normal.  Mild hypokalemia at 3.4 today.  AST 106 down to 79 today.  ALT normal , total bilirubin 1.5, INR 1.0  Past Medical History:  Diagnosis Date  . Hypertension     No past surgical history on file.  Prior to Admission medications   Medication Sig Start Date End Date Taking? Authorizing Provider  acetaminophen (TYLENOL) 325 MG tablet Take 2 tablets (650 mg total) by mouth every 6 (six) hours as needed for mild pain (or Fever >/= 101). Patient not taking: Reported on 06/21/2019 11/24/15   Alison Murray, MD  albuterol (PROVENTIL HFA;VENTOLIN HFA) 108 [phone] Base) MCG/ACT inhaler Inhale 2 puffs into the lungs every 6 (six)  hours as needed for wheezing or shortness of breath. Patient not taking: Reported on 06/21/2019 11/24/15   Alison Murray, MD  levofloxacin (LEVAQUIN) 750 MG tablet Take 1 tablet (750 mg total) by mouth daily at 6 PM. Patient not taking: Reported on 06/21/2019 11/24/15   Alison Murray, MD  methocarbamol (ROBAXIN) 500 MG tablet Take 1 tablet (500 mg total) by mouth every 8 (eight) hours as needed for muscle spasms. Patient not taking: Reported on 06/21/2019 11/24/15   Alison Murray, MD    Current Facility-Administered Medications  Medication Dose Route Frequency Provider Last Rate Last Admin  . 0.9 %  sodium chloride infusion (Manually program via Guardrails IV Fluids)   Intravenous Once Katherine Roan, MD      . acetaminophen (TYLENOL) tablet 650 mg  650 mg Oral Q6H PRN Katherine Roan, MD       Or  . acetaminophen (TYLENOL) suppository 650 mg  650 mg Rectal Q6H PRN Katherine Roan, MD      . chlordiazePOXIDE (LIBRIUM) capsule 25 mg  25 mg Oral QID Katherine Roan, MD   25 mg at 06/22/19 1404   Followed by  . [START ON 06/23/2019] chlordiazePOXIDE (LIBRIUM) capsule 25 mg  25 mg Oral TID Katherine Roan, MD       Followed by  . [START ON 06/24/2019] chlordiazePOXIDE (LIBRIUM) capsule 25 mg  25 mg Oral Betti Cruz, MD       Followed by  . [START ON 06/25/2019] chlordiazePOXIDE (LIBRIUM) capsule 25 mg  25 mg Oral Daily Katherine Roan, MD      . folic acid (FOLVITE) tablet 1 mg  1 mg Oral Daily Katherine Roan, MD   1 mg at 06/22/19 0956  . LORazepam (ATIVAN) tablet 1-4 mg  1-4 mg Oral Q1H PRN Katherine Roan, MD       Or  . LORazepam (ATIVAN) injection 1-4 mg  1-4 mg Intravenous Q1H PRN Katherine Roan, MD      . multivitamin with minerals tablet 1 tablet  1 tablet Oral Daily Katherine Roan, MD   1 tablet at 06/22/19 0956  . nicotine (NICODERM CQ - dosed in mg/24 hours) patch 21 mg  21 mg Transdermal Daily Katherine Roan, MD   21 mg at 06/22/19 1011  . ondansetron (ZOFRAN-ODT) disintegrating tablet 8 mg  8 mg Oral Q8H PRN Katherine Roan, MD       Or  . ondansetron [facility]) 8 mg in sodium chloride 0.9 % 50 mL IVPB  8 mg Intravenous Q6H PRN Katherine Roan, MD      . pantoprazole (PROTONIX) injection 40 mg  40 mg Intravenous Q12H Katherine Roan, MD   40 mg at 06/22/19 0957  . polyethylene glycol (MIRALAX / GLYCOLAX) packet 17 g  17 g Oral Daily PRN Katherine Roan, MD      . sodium chloride flush (NS) 0.9 % injection 3 mL  3 mL Intravenous Q12H Katherine Roan, MD   3 mL at 06/22/19 0957  . thiamine tablet 100 mg  100 mg Oral Daily Katherine Roan, MD   100 mg at 06/22/19 0957    Allergies as of 06/21/2019  . (No Known Allergies)    Family History    Problem Relation Age of Onset  . Diabetes Mellitus II Neg Hx     Social History   Socioeconomic History  . Marital status: Single    Spouse name: Not on file  . Number of children: Not on file  . Years  of education: Not on file  . Highest education level: Not on file  Occupational History  . Not on file  Tobacco Use  . Smoking status: Current Every Day Smoker    Packs/day: 1.00    Years: 30.00    Pack years: 30.00  . Smokeless tobacco: Never Used  Substance and Sexual Activity  . Alcohol use: Yes    Alcohol/week: 24.0 standard drinks    Types: 24 Cans of beer per week    Comment: 6 cans of beer per day after getting off work  . Drug use: Never  . Sexual activity: Not on file  Other Topics Concern  . Not on file  Social History Narrative  . Not on file   Social Determinants of Health   Financial Resource Strain:   . Difficulty of Paying Living Expenses:   Food Insecurity:   . Worried About Charity fundraiser in the Last Year:   . Arboriculturist in the Last Year:   Transportation Needs:   . Film/video editor (Medical):   Marland Kitchen Lack of Transportation (Non-Medical):   Physical Activity:   . Days of Exercise per Week:   . Minutes of Exercise per Session:   Stress:   . Feeling of Stress :   Social Connections:   . Frequency of Communication with Friends and Family:   . Frequency of Social Gatherings with Friends and Family:   . Attends Religious Services:   . Active Member of Clubs or Organizations:   . Attends Archivist Meetings:   Marland Kitchen Marital Status:   Intimate Partner Violence:   . Fear of Current or Ex-Partner:   . Emotionally Abused:   Marland Kitchen Physically Abused:   . Sexually Abused:     Review of Systems: All systems reviewed and negative except where noted in HPI.  Physical Exam: Vital signs in last 24 hours: Temp:  [98.4 F (36.9 C)-99 F (37.2 C)] 98.4 F (36.9 C) (04/27 1051) Pulse Rate:  [92-109] 99 (04/27 1051) Resp:  [14-23] 17  (04/27 1051) BP: (131-160)/(79-99) 140/89 (04/27 1051) SpO2:  [94 %-100 %] 98 % (04/27 1051)   General:   Alert, well-developed,  male in NAD Psych:  cooperative. Normal mood and affect. Eyes:  Pupils equal, sclera clear, no icterus.   Conjunctiva pink. Ears:  Normal auditory acuity. Nose:  No deformity, discharge,  or lesions. Neck:  Supple; no masses Lungs:  Clear throughout to auscultation.   No wheezes, crackles, or rhonchi.  Heart:  Regular rate and rhythm; no murmurs, no lower extremity edema Abdomen:  Soft, non-distended, nontender, BS active, no palp mass   Rectal:  Deferred  Msk:  Symmetrical without gross deformities. . Neurologic:  Alert and  oriented x4;  grossly normal neurologically. Skin:  Intact without significant lesions or rashes.   Intake/Output from previous day: 04/26 0701 - 04/27 0700 In: 50 [IV Piggyback:50] Out: -  Intake/Output this shift: Total I/O In: 360 [P.O.:360] Out: -   Lab Results: Recent Labs    06/21/19 0946 06/21/19 1836 06/22/19 0421  WBC 6.0 4.4 3.5*  HGB 8.0* 7.6* 7.8*  HCT 27.2* 25.8* 26.3*  PLT 228 212 223   BMET Recent Labs    06/21/19 0946 06/22/19 0421  NA 137 136  K 3.5 3.4*  CL 98 100  CO2 26 27  GLUCOSE 135* 90  BUN <5* <5*  CREATININE 0.67 0.66  CALCIUM 8.8* 8.7*   LFT Recent Labs  06/22/19 0421  PROT 7.3  ALBUMIN 3.0*  AST 79*  ALT 34  ALKPHOS 92  BILITOT 1.5*   PT/INR Recent Labs    06/21/19 1401  LABPROT 12.6  INR 1.0   Hepatitis Panel No results for input(s): HEPBSAG, HCVAB, HEPAIGM, HEPBIGM in the last 72 hours.   . CBC Latest Ref Rng & Units 06/22/2019 06/21/2019 06/21/2019  WBC 4.0 - 10.5 K/uL 3.5(L) 4.4 6.0  Hemoglobin 13.0 - 17.0 g/dL 7.8(L) 7.6(L) 8.0(L)  Hematocrit 39.0 - 52.0 % 26.3(L) 25.8(L) 27.2(L)  Platelets 150 - 400 K/uL 223 212 228    . CMP Latest Ref Rng & Units 06/22/2019 06/21/2019 08/10/2018  Glucose 70 - 99 mg/dL 90 631(S) 87  BUN 6 - 20 mg/dL <9(F) <0(Y) 5(L)    Creatinine 0.61 - 1.24 mg/dL 6.37 8.58 8.50  Sodium 135 - 145 mmol/L 136 137 139  Potassium 3.5 - 5.1 mmol/L 3.4(L) 3.5 3.7  Chloride 98 - 111 mmol/L 100 98 105  CO2 22 - 32 mmol/L 27 26 23   Calcium 8.9 - 10.3 mg/dL ) 2.7(X) 9.2  Total Protein 6.5 - 8.1 g/dL 7.3 7.8 7.1  Total Bilirubin 0.3 - 1.2 mg/dL 4.1(O) 1.1 0.8  Alkaline Phos 38 - 126 U/L 92 98 60  AST 15 - 41 U/L 79(H) 106(H) 142(H)  ALT 0 - 44 U/L 34 43 101(H)   Studies/Results: 8.7(O Abdomen Complete  Result Date: 06/21/2019 CLINICAL DATA:  Vomiting and elevated liver function tests EXAM: ABDOMEN ULTRASOUND COMPLETE COMPARISON:  None. FINDINGS: Gallbladder: No gallstones or wall thickening visualized. No sonographic Murphy sign noted by sonographer. Common bile duct: Diameter: 4.6 mm. Liver: Diffusely increased in echogenicity without focal mass. The liver is mildly enlarged as well. Portal vein is patent on color Doppler imaging with normal direction of blood flow towards the liver. IVC: No abnormality visualized. Pancreas: Not well visualized. Spleen: Size and appearance within normal limits. Right Kidney: Length: 11.7 cm. Echogenicity within normal limits. No mass or hydronephrosis visualized. Left Kidney: Length: 12.2 cm. Echogenicity within normal limits. No mass or hydronephrosis visualized. Abdominal aorta: No aneurysm visualized. Other findings: None. IMPRESSION: Fatty infiltration of the liver. Mild hepatomegaly. No other focal abnormality is seen. Electronically Signed   By: 06/23/2019 M.D.   On: 06/21/2019 19:29    Active Problems:   Alcohol use disorder, severe, dependence ([facility])    06/23/2019, NP-C @  06/22/2019, 2:56 PM

## 2019-06-22 NOTE — Progress Notes (Signed)
Consulted with Dr. Lyn Hollingshead to discuss unavailable telemetry box and to clarify if patient needs to be placed on telemetry department for cardiac monitoring.  Order received for Q2hr vitals until telemetry box available.

## 2019-06-22 NOTE — Progress Notes (Signed)
Subjective:  Patient seen at bedside. Patient states he has had some diarrhea. Denies repeat bloody vomit. Patient denies abdominal pain.  Objective:    Vital Signs (last 24 hours): Vitals:   06/21/19 2215 06/21/19 2245 06/22/19 0259 06/22/19 0353  BP: (!) 142/87 (!) 146/91 (!) 144/87 (!) 144/87  Pulse: 96 92 98 98  Resp: 17 20 16    Temp:   99 F (37.2 C)   TempSrc:   Oral   SpO2: 98% 98% 96%    Physical Exam: General Alert and answers questions appropriately, no acute distress  Cardiac Regular rate and rhythm, no murmurs, rubs, or gallops  Pulmonary Clear to auscultation bilaterally without wheezes, rhonchi, or rales  Abdominal Soft, non-tender, without distention. Bowel sounds present   CBC Latest Ref Rng & Units 06/22/2019 06/21/2019 06/21/2019  WBC 4.0 - 10.5 K/uL 3.5(L) 4.4 6.0  Hemoglobin 13.0 - 17.0 g/dL 7.8(L) 7.6(L) 8.0(L)  Hematocrit 39.0 - 52.0 % 26.3(L) 25.8(L) 27.2(L)  Platelets 150 - 400 K/uL 223 212 228   CMP Latest Ref Rng & Units 06/22/2019 06/21/2019 08/10/2018  Glucose 70 - 99 mg/dL 90 135(H) 87  BUN 6 - 20 mg/dL <5(L) <5(L) 5(L)  Creatinine 0.61 - 1.24 mg/dL 0.66 0.67 0.65  Sodium 135 - 145 mmol/L 136 137 139  Potassium 3.5 - 5.1 mmol/L 3.4(L) 3.5 3.7  Chloride 98 - 111 mmol/L 100 98 105  CO2 22 - 32 mmol/L 27 26 23   Calcium 8.9 - 10.3 mg/dL 8.7(L) 8.8(L) 9.2  Total Protein 6.5 - 8.1 g/dL 7.3 7.8 7.1  Total Bilirubin 0.3 - 1.2 mg/dL 1.5(H) 1.1 0.8  Alkaline Phos 38 - 126 U/L 92 98 60  AST 15 - 41 U/L 79(H) 106(H) 142(H)  ALT 0 - 44 U/L 34 43 101(H)   Complete Abdominal Ultrasound (06/21/19): IMPRESSION: *Fatty infiltration of the liver. *Mild hepatomegaly. *No other focal abnormality is seen.  CIWA: Max 13  Assessment/Plan:   Active Problems:   Alcohol use disorder, severe, dependence ([place])  Patient is a 54 year old male with past medical history significant for hypertension who presented on 06/21/19 requesting alcohol detoxification in the  setting of 2 days of increased nausea and vomiting.  # Alcohol use disorder # Alcoholic liver disease Patient with heavy, chronic alcohol use, risk for withdrawals.  Alcohol 117 on presentation.  LFT abnormalities consistent with alcoholic liver disease.  Laboratory studies demonstrate mostly preserved synthetic function with normal INR, platelets, but mildly low albumin at 3.4.  Max CIWA score of 13 overnight.  Complete abdominal ultrasound demonstrates fatty infiltration of the liver, likely secondary to alcohol use, mild hepatomegaly, no other focal abnormality visualized. *Continue CIWA with ativan *Librium taper - 50 mg loading today followed by 25 mg twice daily *Thiamine+folate+multivitamin daily  # Anemia # Hematemesis On presentation, patient reported 1 episode of small amount of blood in his vomit that morning.  Patient reports occasional incidence of blood in his vomit chronically.  Patient also reported 1 day of dark stools, FOBT was negative on presentation. Hemoglobin is low but stable. Initial hemoglobin of 8.0, down from 12.6 in June 2020.  Repeat hemoglobin in the evening of 7.6, up to 7.8 this morning.  Hemoglobin is stable.  Iron studies are consistent with iron deficiency anemia. *Continue 40 mg IV pantoprazole twice daily *Feraheme x1 *Will need outpatient GI evaluation  # Tobacco use disorder: Continue nicotine patch 21 mg daily  # Hypertension: Blood pressure remains stable, moderately elevated.  We will continue to  hold antihypertensives in the setting of possible GI bleed  PT/OT: Consulted Diet: Regular DVT Ppx: SCDs Admit Status: Inpatient Dispo: Anticipated discharge in approximately 1-3 days  Katherine Roan, MD 06/22/2019, 7:19 AM

## 2019-06-22 NOTE — Consult Note (Signed)
 Referring Provider:  [facility]         Primary Care Physician:  Patient, No Pcp Per Primary Gastroenterologist:   Unassigned           We were asked to see this patient for: Hematemesis                ASSESSMENT /  PLAN    54-year-old male with PMH significant for tobacco abuse, alcohol abuse, fatty liver disease , hypertension   # Longstanding history of alcohol abuse / Abnormal liver tests.  --Pattern of enzyme elevation compatible with EtOH . No evidence for cirrhosis at this point. Hepatomegaly and fatty liver on US.  INR normal, platelets normal --MDF below threshold for steroid --Obtain viral hepatitis studies to rule out chronic viral hepatitis.  --On CIWA   # Iron deficiency anemia / reports of hematemesis / chronic dark stool  --Hgb 7.6, down from 12.6 in June 2020.  -- FOBT negative. The amount of blood in emesis yesterday sounds scant but he has taken BC powders / Aleve QOD for months so need to rule out erosive disease / PUD. Will arrange for EGD to be done tomorrow. The risks and benefits of EGD were discussed and the patient agrees to proceed.  --Patient reports having had a normal colonoscopy in prison two years ago in [place], [place]. I will ask nursing staff to get report though may prove difficult.  --Continue twice daily IV PPI --Admitting team ordered dose of IV iron --Of note, he does report frequent episodes of epistaxis with perfuse bleeding.   # Mild hypokalemia / hypomagnesemia --Repletion in progress  HPI:    Chief Complaint: Alcoholism, vomiting blood and dark stool  Samuel Blevins is a 54 y.o. male who presented to the ED yesterday to detox from alcohol.  Last drink was yesterday morning at 7 AM.  He has a long history of alcohol abuse.  Yesterday morning patient vomited liquid mixed with blood. He saw the amount of blood was tiny but he did this before a few months ago. He has heavy nosebleeds. He reports chocolate colored liquid stools for months  " ever since he started drinking heavy".  He takes BC powders / Aleve QOD for Etoh related headaches. Denies abdominal pain. No GERD or dysphagia. He reports significant weight loss over last 6 months because he would rather drink than eat. No FMH of colon cancer. Says he had a normal colonoscopy in prison 2 two years ago, done because of his age ( ? Screening).    Data review: Presenting hemoglobin 8.  Not any labs to compare but his baseline hemoglobin is possibly 12.6.  TIBC 438, iron saturation 8%, ferritin 20 , folate 6.7 , B12 434 . WBC 3.5 , MCV 82, platelets 228.  Renal function normal.  Mild hypokalemia at 3.4 today.  AST 106 down to 79 today.  ALT normal , total bilirubin 1.5, INR 1.0  Past Medical History:  Diagnosis Date  . Hypertension     No past surgical history on file.  Prior to Admission medications   Medication Sig Start Date End Date Taking? Authorizing Provider  acetaminophen (TYLENOL) 325 MG tablet Take 2 tablets (650 mg total) by mouth every 6 (six) hours as needed for mild pain (or Fever >/= 101). Patient not taking: Reported on 06/21/2019 11/24/15   Devine, Alma M, MD  albuterol (PROVENTIL HFA;VENTOLIN HFA) 108 (90 Base) MCG/ACT inhaler Inhale 2 puffs into the lungs every 6 (six)   hours as needed for wheezing or shortness of breath. Patient not taking: Reported on 06/21/2019 11/24/15   Alison Murray, MD  levofloxacin (LEVAQUIN) 750 MG tablet Take 1 tablet (750 mg total) by mouth daily at 6 PM. Patient not taking: Reported on 06/21/2019 11/24/15   Alison Murray, MD  methocarbamol (ROBAXIN) 500 MG tablet Take 1 tablet (500 mg total) by mouth every 8 (eight) hours as needed for muscle spasms. Patient not taking: Reported on 06/21/2019 11/24/15   Alison Murray, MD    Current Facility-Administered Medications  Medication Dose Route Frequency Provider Last Rate Last Admin  . 0.9 %  sodium chloride infusion (Manually program via Guardrails IV Fluids)   Intravenous Once Katherine Roan, MD      . acetaminophen (TYLENOL) tablet 650 mg  650 mg Oral Q6H PRN Katherine Roan, MD       Or  . acetaminophen (TYLENOL) suppository 650 mg  650 mg Rectal Q6H PRN Katherine Roan, MD      . chlordiazePOXIDE (LIBRIUM) capsule 25 mg  25 mg Oral QID Katherine Roan, MD   25 mg at 06/22/19 1404   Followed by  . [START ON 06/23/2019] chlordiazePOXIDE (LIBRIUM) capsule 25 mg  25 mg Oral TID Katherine Roan, MD       Followed by  . [START ON 06/24/2019] chlordiazePOXIDE (LIBRIUM) capsule 25 mg  25 mg Oral Betti Cruz, MD       Followed by  . [START ON 06/25/2019] chlordiazePOXIDE (LIBRIUM) capsule 25 mg  25 mg Oral Daily Katherine Roan, MD      . folic acid (FOLVITE) tablet 1 mg  1 mg Oral Daily Katherine Roan, MD   1 mg at 06/22/19 0956  . LORazepam (ATIVAN) tablet 1-4 mg  1-4 mg Oral Q1H PRN Katherine Roan, MD       Or  . LORazepam (ATIVAN) injection 1-4 mg  1-4 mg Intravenous Q1H PRN Katherine Roan, MD      . multivitamin with minerals tablet 1 tablet  1 tablet Oral Daily Katherine Roan, MD   1 tablet at 06/22/19 0956  . nicotine (NICODERM CQ - dosed in mg/24 hours) patch 21 mg  21 mg Transdermal Daily Katherine Roan, MD   21 mg at 06/22/19 1011  . ondansetron (ZOFRAN-ODT) disintegrating tablet 8 mg  8 mg Oral Q8H PRN Katherine Roan, MD       Or  . ondansetron [facility]) 8 mg in sodium chloride 0.9 % 50 mL IVPB  8 mg Intravenous Q6H PRN Katherine Roan, MD      . pantoprazole (PROTONIX) injection 40 mg  40 mg Intravenous Q12H Katherine Roan, MD   40 mg at 06/22/19 0957  . polyethylene glycol (MIRALAX / GLYCOLAX) packet 17 g  17 g Oral Daily PRN Katherine Roan, MD      . sodium chloride flush (NS) 0.9 % injection 3 mL  3 mL Intravenous Q12H Katherine Roan, MD   3 mL at 06/22/19 0957  . thiamine tablet 100 mg  100 mg Oral Daily Katherine Roan, MD   100 mg at 06/22/19 0957    Allergies as of 06/21/2019  . (No Known Allergies)    Family History    Problem Relation Age of Onset  . Diabetes Mellitus II Neg Hx     Social History   Socioeconomic History  . Marital status: Single    Spouse name: Not on file  . Number of children: Not on file  . Years  of education: Not on file  . Highest education level: Not on file  Occupational History  . Not on file  Tobacco Use  . Smoking status: Current Every Day Smoker    Packs/day: 1.00    Years: 30.00    Pack years: 30.00  . Smokeless tobacco: Never Used  Substance and Sexual Activity  . Alcohol use: Yes    Alcohol/week: 24.0 standard drinks    Types: 24 Cans of beer per week    Comment: 6 cans of beer per day after getting off work  . Drug use: Never  . Sexual activity: Not on file  Other Topics Concern  . Not on file  Social History Narrative  . Not on file   Social Determinants of Health   Financial Resource Strain:   . Difficulty of Paying Living Expenses:   Food Insecurity:   . Worried About Charity fundraiser in the Last Year:   . Arboriculturist in the Last Year:   Transportation Needs:   . Film/video editor (Medical):   Marland Kitchen Lack of Transportation (Non-Medical):   Physical Activity:   . Days of Exercise per Week:   . Minutes of Exercise per Session:   Stress:   . Feeling of Stress :   Social Connections:   . Frequency of Communication with Friends and Family:   . Frequency of Social Gatherings with Friends and Family:   . Attends Religious Services:   . Active Member of Clubs or Organizations:   . Attends Archivist Meetings:   Marland Kitchen Marital Status:   Intimate Partner Violence:   . Fear of Current or Ex-Partner:   . Emotionally Abused:   Marland Kitchen Physically Abused:   . Sexually Abused:     Review of Systems: All systems reviewed and negative except where noted in HPI.  Physical Exam: Vital signs in last 24 hours: Temp:  [98.4 F (36.9 C)-99 F (37.2 C)] 98.4 F (36.9 C) (04/27 1051) Pulse Rate:  [92-109] 99 (04/27 1051) Resp:  [14-23] 17  (04/27 1051) BP: (131-160)/(79-99) 140/89 (04/27 1051) SpO2:  [94 %-100 %] 98 % (04/27 1051)   General:   Alert, well-developed,  male in NAD Psych:  cooperative. Normal mood and affect. Eyes:  Pupils equal, sclera clear, no icterus.   Conjunctiva pink. Ears:  Normal auditory acuity. Nose:  No deformity, discharge,  or lesions. Neck:  Supple; no masses Lungs:  Clear throughout to auscultation.   No wheezes, crackles, or rhonchi.  Heart:  Regular rate and rhythm; no murmurs, no lower extremity edema Abdomen:  Soft, non-distended, nontender, BS active, no palp mass   Rectal:  Deferred  Msk:  Symmetrical without gross deformities. . Neurologic:  Alert and  oriented x4;  grossly normal neurologically. Skin:  Intact without significant lesions or rashes.   Intake/Output from previous day: 04/26 0701 - 04/27 0700 In: 50 [IV Piggyback:50] Out: -  Intake/Output this shift: Total I/O In: 360 [P.O.:360] Out: -   Lab Results: Recent Labs    06/21/19 0946 06/21/19 1836 06/22/19 0421  WBC 6.0 4.4 3.5*  HGB 8.0* 7.6* 7.8*  HCT 27.2* 25.8* 26.3*  PLT 228 212 223   BMET Recent Labs    06/21/19 0946 06/22/19 0421  NA 137 136  K 3.5 3.4*  CL 98 100  CO2 26 27  GLUCOSE 135* 90  BUN <5* <5*  CREATININE 0.67 0.66  CALCIUM 8.8* 8.7*   LFT Recent Labs  06/22/19 0421  PROT 7.3  ALBUMIN 3.0*  AST 79*  ALT 34  ALKPHOS 92  BILITOT 1.5*   PT/INR Recent Labs    06/21/19 1401  LABPROT 12.6  INR 1.0   Hepatitis Panel No results for input(s): HEPBSAG, HCVAB, HEPAIGM, HEPBIGM in the last 72 hours.   . CBC Latest Ref Rng & Units 06/22/2019 06/21/2019 06/21/2019  WBC 4.0 - 10.5 K/uL 3.5(L) 4.4 6.0  Hemoglobin 13.0 - 17.0 g/dL 7.8(L) 7.6(L) 8.0(L)  Hematocrit 39.0 - 52.0 % 26.3(L) 25.8(L) 27.2(L)  Platelets 150 - 400 K/uL 223 212 228    . CMP Latest Ref Rng & Units 06/22/2019 06/21/2019 08/10/2018  Glucose 70 - 99 mg/dL 90 631(S) 87  BUN 6 - 20 mg/dL <9(F) <0(Y) 5(L)    Creatinine 0.61 - 1.24 mg/dL 6.37 8.58 8.50  Sodium 135 - 145 mmol/L 136 137 139  Potassium 3.5 - 5.1 mmol/L 3.4(L) 3.5 3.7  Chloride 98 - 111 mmol/L 100 98 105  CO2 22 - 32 mmol/L 27 26 23   Calcium 8.9 - 10.3 mg/dL ) 2.7(X) 9.2  Total Protein 6.5 - 8.1 g/dL 7.3 7.8 7.1  Total Bilirubin 0.3 - 1.2 mg/dL 4.1(O) 1.1 0.8  Alkaline Phos 38 - 126 U/L 92 98 60  AST 15 - 41 U/L 79(H) 106(H) 142(H)  ALT 0 - 44 U/L 34 43 101(H)   Studies/Results: 8.7(O Abdomen Complete  Result Date: 06/21/2019 CLINICAL DATA:  Vomiting and elevated liver function tests EXAM: ABDOMEN ULTRASOUND COMPLETE COMPARISON:  None. FINDINGS: Gallbladder: No gallstones or wall thickening visualized. No sonographic Murphy sign noted by sonographer. Common bile duct: Diameter: 4.6 mm. Liver: Diffusely increased in echogenicity without focal mass. The liver is mildly enlarged as well. Portal vein is patent on color Doppler imaging with normal direction of blood flow towards the liver. IVC: No abnormality visualized. Pancreas: Not well visualized. Spleen: Size and appearance within normal limits. Right Kidney: Length: 11.7 cm. Echogenicity within normal limits. No mass or hydronephrosis visualized. Left Kidney: Length: 12.2 cm. Echogenicity within normal limits. No mass or hydronephrosis visualized. Abdominal aorta: No aneurysm visualized. Other findings: None. IMPRESSION: Fatty infiltration of the liver. Mild hepatomegaly. No other focal abnormality is seen. Electronically Signed   By: 06/23/2019 M.D.   On: 06/21/2019 19:29    Active Problems:   Alcohol use disorder, severe, dependence (HCC)    06/23/2019, NP-C @  06/22/2019, 2:56 PM

## 2019-06-22 NOTE — TOC Initial Note (Signed)
Transition of Care [facility]) - Initial/Assessment Note    Patient Details  Name: Samuel Blevins MRN: [id number] Date of Birth: [birth year]  Transition of [facility]) CM/SW Contact:    Curlene Labrum, RN Phone Number: 06/22/2019, 3:29 PM  Clinical Narrative:                 Case management met with the patient at the bedside to discuss patient's alcohol addiction and need for primary care physician.  Patient currently lives in a homeless shelter on [facility]. He is in the hospital to help with detoxification from alcohol and to be able to take prescribed medications to avoid alcohol dependence.  SBIRT completed at the bedside - see notes in flowsheet.  Patient was also given information regarding primary care physician at [facility]. Will continue to follow.  Expected Discharge Plan: Homeless Shelter Barriers to Discharge: Continued Medical Work up   Patient Goals and CMS Choice Patient states their goals for this hospitalization and ongoing recovery are:: I want to be able to take medications to stop drinking alcohol.      Expected Discharge Plan and Services Expected Discharge Plan: Homeless Shelter   Discharge Planning Services: CM Consult   Living arrangements for the past 2 months: Homeless Shelter                                      Prior Living Arrangements/Services Living arrangements for the past 2 months: [place] with:: Facility Resident Patient language and need for interpreter reviewed:: Yes Do you feel safe going back to the place where you live?: Yes      Need for Family Participation in Patient Care: Yes (Comment) Care giver support system in place?: Yes (comment)   Criminal Activity/Legal Involvement Pertinent to Current Situation/Hospitalization: No - Comment as needed  Activities of Daily Living      Permission Sought/Granted Permission sought to share information with : Case Manager Permission granted  to share information with : Yes, Verbal Permission Granted              Emotional Assessment Appearance:: Appears older than stated age Attitude/Demeanor/Rapport: Gracious Affect (typically observed): Accepting Orientation: : Oriented to Self, Oriented to Place, Oriented to  Time, Oriented to Situation Alcohol / Substance Use: Alcohol Use Psych Involvement: No (comment)  Admission diagnosis:  Hypomagnesemia [E83.42] Chronic alcoholism (HCC) [F10.20] Nausea and vomiting in adult [R11.2] Elevated AST (SGOT) [R74.01] Alcohol withdrawal syndrome with complication (HCC) [A41.660] Symptomatic anemia [D64.9] Alcohol use disorder, severe, dependence ([place]) [F10.20] Patient Active Problem List   Diagnosis Date Noted  . Alcohol use disorder, severe, dependence ([place]) 06/21/2019  . Acute respiratory failure with hypoxia ([place]) 11/23/2015  . Normochromic normocytic anemia 11/23/2015  . Acute respiratory failure ([place]) 11/23/2015  . Lobar pneumonia, unspecified organism ([place])   . Bronchitis 11/22/2015   PCP:  Patient, No Pcp Per Pharmacy:   [place] - [address] [place] [address] Phone: [phone] Fax: [phone]     Social Determinants of Health (SDOH) Interventions    Readmission Risk Interventions Readmission Risk Prevention Plan 06/22/2019  Post Dischage Appt Complete  Medication Screening Complete  Transportation Screening Complete  Some recent data might be hidden

## 2019-06-22 NOTE — ED Notes (Signed)
Spoke with floor nurse Harvie Heck. Not available to take report. Will call back in 

## 2019-06-22 NOTE — Plan of Care (Signed)
  Problem: Education: Goal: Knowledge of General Education information will improve Description: Including pain rating scale, medication(s)/side effects and non-pharmacologic comfort measures Outcome: Progressing   Problem: Clinical Measurements: Goal: Will remain free from infection Outcome: Progressing   Problem: Activity: Goal: Risk for activity intolerance will decrease Outcome: Progressing   

## 2019-06-23 ENCOUNTER — Encounter (HOSPITAL_COMMUNITY): Payer: Self-pay | Admitting: Internal Medicine

## 2019-06-23 ENCOUNTER — Encounter (HOSPITAL_COMMUNITY)
Admission: EM | Disposition: A | Discharge status: Home / Self Care | Payer: Self-pay | Source: Home / Self Care | Attending: Internal Medicine

## 2019-06-23 ENCOUNTER — Inpatient Hospital Stay (HOSPITAL_COMMUNITY): Payer: Self-pay | Admitting: Anesthesiology

## 2019-06-23 ENCOUNTER — Other Ambulatory Visit: Payer: Self-pay | Admitting: Physician Assistant

## 2019-06-23 DIAGNOSIS — K2970 Gastritis, unspecified, without bleeding: Secondary | ICD-10-CM

## 2019-06-23 DIAGNOSIS — F10239 Alcohol dependence with withdrawal, unspecified: Secondary | ICD-10-CM

## 2019-06-23 DIAGNOSIS — K5520 Angiodysplasia of colon without hemorrhage: Secondary | ICD-10-CM

## 2019-06-23 DIAGNOSIS — K2990 Gastroduodenitis, unspecified, without bleeding: Secondary | ICD-10-CM

## 2019-06-23 DIAGNOSIS — K31819 Angiodysplasia of stomach and duodenum without bleeding: Secondary | ICD-10-CM

## 2019-06-23 HISTORY — PX: ESOPHAGOGASTRODUODENOSCOPY (EGD) WITH PROPOFOL: SHX5813

## 2019-06-23 HISTORY — PX: BIOPSY: SHX5522

## 2019-06-23 HISTORY — PX: HOT HEMOSTASIS: SHX5433

## 2019-06-23 LAB — COMPREHENSIVE METABOLIC PANEL
ALT: 32 U/L (ref 0–44)
AST: 61 U/L — ABNORMAL HIGH (ref 15–41)
Albumin: 3.1 g/dL — ABNORMAL LOW (ref 3.5–5.0)
Alkaline Phosphatase: 89 U/L (ref 38–126)
Anion gap: 9 (ref 5–15)
BUN: 5 mg/dL — ABNORMAL LOW (ref 6–20)
CO2: 26 mmol/L (ref 22–32)
Calcium: 9.3 mg/dL (ref 8.9–10.3)
Chloride: 102 mmol/L (ref 98–111)
Creatinine, Ser: 0.81 mg/dL (ref 0.61–1.24)
GFR calc Af Amer: >60 mL/min (ref >60)
GFR calc non Af Amer: >60 mL/min (ref >60)
Glucose, Bld: 98 mg/dL (ref 70–99)
Potassium: 4 mmol/L (ref 3.5–5.1)
Sodium: 137 mmol/L (ref 135–145)
Total Bilirubin: 1.1 mg/dL (ref 0.3–1.2)
Total Protein: 7.7 g/dL (ref 6.5–8.1)

## 2019-06-23 LAB — CBC
HCT: 29.2 % — ABNORMAL LOW (ref 39.0–52.0)
Hemoglobin: 8.7 g/dL — ABNORMAL LOW (ref 13.0–17.0)
MCH: 24.4 pg — ABNORMAL LOW (ref 26.0–34.0)
MCHC: 29.8 g/dL — ABNORMAL LOW (ref 30.0–36.0)
MCV: 82 fL (ref 80.0–100.0)
Platelets: 230 10*3/uL (ref 150–400)
RBC: 3.56 MIL/uL — ABNORMAL LOW (ref 4.22–5.81)
RDW: 19.9 % — ABNORMAL HIGH (ref 11.5–15.5)
WBC: 4.1 10*3/uL (ref 4.0–10.5)
nRBC: 0 % (ref 0.0–0.2)

## 2019-06-23 LAB — PHOSPHORUS: Phosphorus: 3.5 mg/dL (ref 2.5–4.6)

## 2019-06-23 SURGERY — ESOPHAGOGASTRODUODENOSCOPY (EGD) WITH PROPOFOL
Anesthesia: Monitor Anesthesia Care

## 2019-06-23 MED ORDER — PANTOPRAZOLE SODIUM 40 MG PO TBEC
40.0000 mg | DELAYED_RELEASE_TABLET | Freq: Two times a day (BID) | ORAL | Status: DC
  Administered 2019-06-23 – 2019-06-24 (×2): 40 mg via ORAL
  Filled 2019-06-23 (×2): qty 1

## 2019-06-23 MED ORDER — FERROUS SULFATE 325 (65 FE) MG PO TABS
325.0000 mg | ORAL_TABLET | Freq: Two times a day (BID) | ORAL | Status: DC
  Administered 2019-06-24: 08:00:00 325 mg via ORAL
  Filled 2019-06-23: qty 1

## 2019-06-23 MED ORDER — PROPOFOL 500 MG/50ML IV EMUL
INTRAVENOUS | Status: DC | PRN
  Administered 2019-06-23: 75 ug/kg/min via INTRAVENOUS

## 2019-06-23 MED ORDER — PROPOFOL 10 MG/ML IV BOLUS
INTRAVENOUS | Status: DC | PRN
  Administered 2019-06-23: 10 mg via INTRAVENOUS
  Administered 2019-06-23: 20 mg via INTRAVENOUS

## 2019-06-23 MED ORDER — LACTATED RINGERS IV SOLN: INTRAVENOUS | Status: DC | PRN

## 2019-06-23 SURGICAL SUPPLY — 15 items

## 2019-06-23 NOTE — Progress Notes (Signed)
PT Cancellation Note  Patient Details Name: Samuel Blevins MRN: [id number] DOB: 1965-12-18   Cancelled Treatment:    Reason Eval/Treat Not Completed: Patient at procedure or test/unavailable (EGD).    Lillia Pauls, PT, DPT Acute Rehabilitation Services Pager [phone] Office [phone]    Norval Morton 06/23/2019, 8:08 AM

## 2019-06-23 NOTE — Progress Notes (Signed)
OT Cancellation Note  Patient Details Name: Samuel Blevins MRN: [id number] DOB: 1966-01-20   Cancelled Treatment:    Reason Eval/Treat Not Completed: Patient at procedure or test/ unavailable. Plan to reattempt at a later date/time.  Raynald Kemp, OT Acute Rehabilitation Services Pager: [phone] Office: [phone]  06/23/2019, 9:36 AM

## 2019-06-23 NOTE — Anesthesia Postprocedure Evaluation (Signed)
Anesthesia Post Note  Patient: Samuel Blevins  Procedure(s) Performed: ESOPHAGOGASTRODUODENOSCOPY (EGD) WITH PROPOFOL (N/A ) HOT HEMOSTASIS (ARGON PLASMA COAGULATION/BICAP) (N/A )     Patient location during evaluation: PACU Anesthesia Type: MAC Level of consciousness: awake and alert Pain management: pain level controlled Vital Signs Assessment: post-procedure vital signs reviewed and stable Respiratory status: spontaneous breathing, nonlabored ventilation and respiratory function stable Cardiovascular status: blood pressure returned to baseline and stable Postop Assessment: no apparent nausea or vomiting Anesthetic complications: no    Last Vitals:  Vitals:   06/23/19 1303 06/23/19 1313  BP: 109/71 125/74  Pulse: 96 88  Resp: (!) 21 18  Temp: 36.7 C   SpO2: 97% 97%    Last Pain:  Vitals:   06/23/19 1303  TempSrc: Oral  PainSc: 0-No pain                 Pervis Hocking

## 2019-06-23 NOTE — Transfer of Care (Signed)
Immediate Anesthesia Transfer of Care Note  Patient: Samuel Blevins  Procedure(s) Performed: ESOPHAGOGASTRODUODENOSCOPY (EGD) WITH PROPOFOL (N/A ) HOT HEMOSTASIS (ARGON PLASMA COAGULATION/BICAP) (N/A )  Patient Location: Endoscopy Unit  Anesthesia Type:MAC  Level of Consciousness: awake, alert  and oriented  Airway & Oxygen Therapy: Patient Spontanous Breathing  Post-op Assessment: Report given to RN, Post -op Vital signs reviewed and stable and Patient moving all extremities X 4  Post vital signs: Reviewed and stable  Last Vitals:  Vitals Value Taken Time  BP 109/71 06/23/19 1303  Temp    Pulse 89 06/23/19 1304  Resp 16 06/23/19 1304  SpO2 95 % 06/23/19 1304  Vitals shown include unvalidated device data.  Last Pain:  Vitals:   06/23/19 1303  TempSrc:   PainSc: 0-No pain         Complications: No apparent anesthesia complications

## 2019-06-23 NOTE — Anesthesia Preprocedure Evaluation (Addendum)
Anesthesia Evaluation  Patient identified by MRN, date of birth, ID band Patient awake  General Assessment Comment:Generally, very disagreeable and angry that he has to answer questions about his history   Reviewed: Allergy & Precautions, NPO status , Patient's Chart, lab work & pertinent test results  Airway Mallampati: III  TM Distance: >3 FB Neck ROM: Full    Dental  (+) Dental Advisory Given, Poor Dentition, Missing, Chipped,  Very poor dentition:   Pulmonary COPD,  COPD inhaler, Current Smoker,  Current smoker, 30 pack year history, 1 ppd currently States no inhaler use    Pulmonary exam normal breath sounds clear to auscultation       Cardiovascular hypertension, Normal cardiovascular exam Rhythm:Regular Rate:Normal     Neuro/Psych negative neurological ROS  negative psych ROS   GI/Hepatic (+)     substance abuse  alcohol use, 24 drinks/wk [place] and a pint per day  Iron deficiency anemia, dark stool, hematemesis    Endo/Other  negative endocrine ROS  Renal/GU negative Renal ROS  negative genitourinary   Musculoskeletal negative musculoskeletal ROS (+)   Abdominal Normal abdominal exam  (+)   Peds negative pediatric ROS (+)  Hematology  (+) Blood dyscrasia, anemia , hct 29.2   Anesthesia Other Findings   Reproductive/Obstetrics negative OB ROS                           Anesthesia Physical Anesthesia Plan  ASA: III  Anesthesia Plan: MAC   Post-op Pain Management:    Induction:   PONV Risk Score and Plan: 2 and Propofol infusion and TIVA  Airway Management Planned: Natural Airway and Simple Face Mask  Additional Equipment: None  Intra-op Plan:   Post-operative Plan:   Informed Consent: I have reviewed the patients History and Physical, chart, labs and discussed the procedure including the risks, benefits and alternatives for the proposed anesthesia with the patient or  authorized representative who has indicated his/her understanding and acceptance.       Plan Discussed with: CRNA  Anesthesia Plan Comments:         Anesthesia Quick Evaluation

## 2019-06-23 NOTE — Progress Notes (Signed)
Subjective:  [name] was examined and evaluated at bedside this am. He states that he does not want to get his EGD done because he was informed that he needs to consent to do the procedure and he feels he would like to refuse due to concern of adverse events during the procedure. Discussed in detail regarding benefits and risks of an EGD and discussed plan to allow gastroenterology to discuss further with him prior to start of the procedure.  GI was paged  Objective:    Vital Signs (last 24 hours): Vitals:   06/22/19 1051 06/22/19 1501 06/22/19 1954 06/23/19 0244  BP: 140/89 124/80 131/80 (!) 143/87  Pulse: 99 99 97 89  Resp: 17 18 17    Temp: 98.4 F (36.9 C) 99 F (37.2 C) 98.2 F (36.8 C) 98.3 F (36.8 C)  TempSrc: Oral Oral Oral Oral  SpO2: 98% 97% 96% 99%   Physical Exam: General Alert and answers questions appropriately, no acute distress  Cardiac Regular rate and rhythm, no murmurs, rubs, or gallops  Pulmonary Clear to auscultation bilaterally without wheezes, rhonchi, or rales  Abdominal Soft, non-tender, without distention. Bowel sounds present   CBC Latest Ref Rng & Units 06/23/2019 06/22/2019 06/21/2019  WBC 4.0 - 10.5 K/uL 4.1 3.5(L) 4.4  Hemoglobin 13.0 - 17.0 g/dL 8.7(L) 7.8(L) 7.6(L)  Hematocrit 39.0 - 52.0 % 29.2(L) 26.3(L) 25.8(L)  Platelets 150 - 400 K/uL 230 223 212   CMP Latest Ref Rng & Units 06/23/2019 06/22/2019 06/21/2019  Glucose 70 - 99 mg/dL 98 90 135(H)  BUN 6 - 20 mg/dL 5(L) <5(L) <5(L)  Creatinine 0.61 - 1.24 mg/dL 0.81 0.66 0.67  Sodium 135 - 145 mmol/L 137 136 137  Potassium 3.5 - 5.1 mmol/L 4.0 3.4(L) 3.5  Chloride 98 - 111 mmol/L 102 100 98  CO2 22 - 32 mmol/L 26 27 26   Calcium 8.9 - 10.3 mg/dL 9.3 8.7(L) 8.8(L)  Total Protein 6.5 - 8.1 g/dL 7.7 7.3 7.8  Total Bilirubin 0.3 - 1.2 mg/dL 1.1 1.5(H) 1.1  Alkaline Phos 38 - 126 U/L 89 92 98  AST 15 - 41 U/L 61(H) 79(H) 106(H)  ALT 0 - 44 U/L 32 34 43   Hepatitis B Surface Ag: Negative HCV  Ab: Negative  Assessment/Plan:   Active Problems:   Symptomatic anemia   Chronic alcoholism (HCC)   Alcohol withdrawal syndrome with complication (HCC)   Elevated AST (SGOT)   Nausea and vomiting in adult   Hematemesis with nausea   Melena  Patient is a 54 year old male with past medical history significant for hypertension who presented on 06/21/2019 requesting alcohol detoxification in the setting of 2 days of increased nausea and vomiting, small-volume hematemesis.  # Anemia # Hematemesis On presentation, patient reported 1 episode of small amount of blood in vomit that morning.  Patient reports occasional incidence of blood in his vomit chronically.  Patient also reported 1 episode of dark stools, FOBT was negative on presentation.  Hemoglobin is low compared to baseline last year, but stable.  After Feraheme yesterday, hemoglobin this morning 8.7 from 7.8 yesterday. *EGD today per GI.  Depending on EGD findings, plan for outpatient colonoscopy for further evaluation *Pantoprazole 40 mg twice daily, will consider discontinuing depending on findings of EGD today  # Alcohol use disorder # Alcoholic liver disease Patient with heavy, chronic alcohol use, risk for withdrawals, alcohol 117 on presentation.  LFT abnormalities consistent with alcoholic liver disease, but mostly preserved synthetic function, past steatosis and mild hepatomegaly  on abdominal ultrasound.  Hepatitis B, C negative. *Continue CIWA with Ativan *Continue Librium taper - librium 25 mg three times daily today *Continue thiamine + folate + multivitamin  # Tobacco use disorder: Continue nicotine patch 21 mg daily  # Hypertension: Blood pressure remains stable, moderately elevated.  We will continue to hold antihypertensives in the setting of possible GI bleed  PT/OT: Consulted Diet: Regular DVT Ppx: SCDs Admit Status: Inpatient Dispo: Anticipated discharge in approximately 1-2 days   Katherine Roan,  MD 06/23/2019, 6:22 AM

## 2019-06-23 NOTE — Interval H&P Note (Signed)
History and Physical Interval Note:  06/23/2019 8:00 AM  Samuel Blevins  has presented today for surgery, with the diagnosis of iron deficiency anemia, dark stool, hematemesis.  The various methods of treatment have been discussed with the patient and family. After consideration of risks, benefits and other options for treatment, the patient has consented to  Procedure(s): ESOPHAGOGASTRODUODENOSCOPY (EGD) WITH PROPOFOL (N/A) as a surgical intervention.  The patient's history has been reviewed, patient examined, no change in status, stable for surgery.  I have reviewed the patient's chart and labs.  Questions were answered to the patient's satisfaction.     [name]

## 2019-06-23 NOTE — Op Note (Signed)
[facility] Patient Name: Samuel Blevins Procedure Date : 06/23/2019 MRN: [id number] Attending MD: Gerrit Heck , MD Date of Birth: 1965-10-23 CSN: [id number] Age: 54 Admit Type: Inpatient Procedure:                Upper GI endoscopy Indications:              Iron deficiency anemia, Hematemesis, Melena, Nausea                            with vomiting Providers:                Gerrit Heck, MD, Cleda Daub, RN, Laverda Sorenson, Technician, Eligha Bridegroom CRNA, CRNA Referring MD:              Medicines:                Monitored Anesthesia Care Complications:            No immediate complications. Estimated Blood Loss:     Estimated blood loss was minimal. Procedure:                Pre-Anesthesia Assessment:                           - Prior to the procedure, a History and Physical                            was performed, and patient medications and                            allergies were reviewed. The patient's tolerance of                            previous anesthesia was also reviewed. The risks                            and benefits of the procedure and the sedation                            options and risks were discussed with the patient.                            All questions were answered, and informed consent                            was obtained. Prior Anticoagulants: The patient has                            taken no previous anticoagulant or antiplatelet                            agents. ASA Grade Assessment: III - A patient with  severe systemic disease. After reviewing the risks                            and benefits, the patient was deemed in                            satisfactory condition to undergo the procedure.                           After obtaining informed consent, the endoscope was                            passed under direct vision. Throughout the    procedure, the patient's blood pressure, pulse, and                            oxygen saturations were monitored continuously. The                            GIF-H190 ([id number] Olympus gastroscope was                            introduced through the mouth, and advanced to the                            third part of duodenum. The upper GI endoscopy was                            accomplished without difficulty. The patient                            tolerated the procedure well. Scope In: Scope Out: Findings:      The examined esophagus was normal.      Scattered mild inflammation characterized by erythema was found in the       gastric body and in the gastric antrum. Biopsies were taken with a cold       forceps for Helicobacter pylori testing. Estimated blood loss was       minimal.      Six small angioectasias with typical arborization were found in the       second portion of the duodenum and in the third portion of the duodenum.       Coagulation for hemostasis using argon plasma was successful. Estimated       blood loss was minimal.      Localized mucosal changes characterized by granularity were found in the       second portion of the duodenum. This was suspicious for duodenal       adenoma. Surface biopsies were taken with a cold forceps for histology.       Estimated blood loss was minimal. Impression:               - Normal esophagus.                           - Gastritis. Biopsied.                           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 Treated with                            argon plasma coagulation (APC).                           - Mucosal changes in the duodenum. Biopsied. Recommendation:           - Return patient to hospital ward for ongoing care.                           - Advance diet as tolerated.                           - Continue present medications.                           - Use Prilosec (omeprazole) 20 mg PO BID for 4                             weeks.                           - Recommend scheduling outpatient colonoscopy for                            routine CRC screening.                           - Repeat EGD with push enteroscopy if concerns for                            rebleeding in the future.                           - If duodenal biopsies demonstrate adenoma, will                            require repeat EGD with Endoscopic Mucosal                            Resection.                           - Resume serial H/H checks with additional blood                            products per protocol.                           - Resume folic acid.                           - Start PO iron with Vitamin C.                           - Resume CIWA.                           -  Please do not hesitate to contact the on-call GI                            if concerns for ongoing or recurrent bleeding. Procedure Code(s):        --- Professional ---                           [phone], 59, Esophagogastroduodenoscopy, flexible,                            transoral; with control of bleeding, any method                           43239, Esophagogastroduodenoscopy, flexible,                            transoral; with biopsy, single or multiple Diagnosis Code(s):        --- Professional ---                           K29.70, Gastritis, unspecified, without bleeding                           K31.819, Angiodysplasia of stomach and duodenum                            without bleeding                           K31.89, Other diseases of stomach and duodenum                           D50.9, Iron deficiency anemia, unspecified                           K92.0, Hematemesis                           K92.1, Melena (includes Hematochezia)                           R11.2, Nausea with vomiting, unspecified CPT copyright 2019 American Medical Association. All rights reserved. The codes documented in this report are preliminary and upon coder review may  be  revised to meet current compliance requirements. Doristine Locks, MD 06/23/2019 1:01:00 PM Number of Addenda: 0

## 2019-06-23 NOTE — Anesthesia Procedure Notes (Signed)
Procedure Name: MAC Date/Time: 06/23/2019 12:23 PM Performed by: Eligha Bridegroom, CRNA Pre-anesthesia Checklist: Patient identified, Emergency Drugs available, Suction available, Patient being monitored and Timeout performed Patient Re-evaluated:Patient Re-evaluated prior to induction Oxygen Delivery Method: Nasal cannula Preoxygenation: Pre-oxygenation with 100% oxygen Induction Type: IV induction

## 2019-06-23 NOTE — Plan of Care (Signed)
  Problem: Education: Goal: Knowledge of General Education information will improve Description: Including pain rating scale, medication(s)/side effects and non-pharmacologic comfort measures Outcome: Progressing   Problem: Activity: Goal: Risk for activity intolerance will decrease Outcome: Progressing   

## 2019-06-24 LAB — CBC
HCT: 28.8 % — ABNORMAL LOW (ref 39.0–52.0)
Hemoglobin: 8.5 g/dL — ABNORMAL LOW (ref 13.0–17.0)
MCH: 24.4 pg — ABNORMAL LOW (ref 26.0–34.0)
MCHC: 29.5 g/dL — ABNORMAL LOW (ref 30.0–36.0)
MCV: 82.8 fL (ref 80.0–100.0)
Platelets: 276 10*3/uL (ref 150–400)
RBC: 3.48 MIL/uL — ABNORMAL LOW (ref 4.22–5.81)
RDW: 20.4 % — ABNORMAL HIGH (ref 11.5–15.5)
WBC: 6.1 10*3/uL (ref 4.0–10.5)
nRBC: 0.8 % — ABNORMAL HIGH (ref 0.0–0.2)

## 2019-06-24 LAB — BASIC METABOLIC PANEL
Anion gap: 10 (ref 5–15)
BUN: 8 mg/dL (ref 6–20)
CO2: 23 mmol/L (ref 22–32)
Calcium: 9.3 mg/dL (ref 8.9–10.3)
Chloride: 103 mmol/L (ref 98–111)
Creatinine, Ser: 0.77 mg/dL (ref 0.61–1.24)
GFR calc Af Amer: >60 mL/min (ref >60)
GFR calc non Af Amer: >60 mL/min (ref >60)
Glucose, Bld: 120 mg/dL — ABNORMAL HIGH (ref 70–99)
Potassium: 3.7 mmol/L (ref 3.5–5.1)
Sodium: 136 mmol/L (ref 135–145)

## 2019-06-24 LAB — MAGNESIUM: Magnesium: 1.7 mg/dL (ref 1.7–2.4)

## 2019-06-24 MED ORDER — CHLORDIAZEPOXIDE HCL 25 MG PO CAPS: ORAL_CAPSULE | ORAL | 0 refills | Status: DC

## 2019-06-24 MED ORDER — FOLIC ACID 1 MG PO TABS: 1.0000 mg | ORAL_TABLET | Freq: Every day | ORAL | 0 refills | Status: DC

## 2019-06-24 MED ORDER — ADULT MULTIVITAMIN W/MINERALS CH: 1.0000 | ORAL_TABLET | Freq: Every day | ORAL | 0 refills | Status: DC

## 2019-06-24 MED ORDER — FERROUS SULFATE 325 (65 FE) MG PO TABS: 325.0000 mg | ORAL_TABLET | Freq: Every day | ORAL | 0 refills | Status: DC

## 2019-06-24 MED ORDER — OMEPRAZOLE 20 MG PO CPDR
20.0000 mg | DELAYED_RELEASE_CAPSULE | Freq: Two times a day (BID) | ORAL | 0 refills | Status: DC
Start: 2019-06-24 — End: 2019-10-28

## 2019-06-24 MED ORDER — NALTREXONE HCL 50 MG PO TABS
50.0000 mg | ORAL_TABLET | Freq: Every day | ORAL | 0 refills | Status: DC
Start: 2019-06-24 — End: 2019-10-28

## 2019-06-24 MED ORDER — THIAMINE HCL 100 MG PO TABS: 100.0000 mg | ORAL_TABLET | Freq: Every day | ORAL | 0 refills | Status: DC

## 2019-06-24 MED FILL — CHLORDIAZEPOXIDE 25 MG CAP: 25 | 2 days supply | Qty: 2 | Fill #0

## 2019-06-24 MED FILL — VITAMIN B-1 100 MG TABS: 100 | 30 days supply | Qty: 30 | Fill #0

## 2019-06-24 MED FILL — FOLIC ACID 1 MG TABS: 1 | 30 days supply | Qty: 30 | Fill #0

## 2019-06-24 MED FILL — FERROUS SULFATE 325 MG TAB: 325 (65 FE) | 30 days supply | Qty: 30 | Fill #0

## 2019-06-24 MED FILL — OMEPRAZOLE 20 MG CPDR: 20 | 30 days supply | Qty: 60 | Fill #0

## 2019-06-24 NOTE — Progress Notes (Addendum)
Subjective:  Was paged by RN that patient was dressed and was going to leave.  Patient seen and evaluated at bedside.  Patient was encouraged to wait for follow-up evaluation by gastroenterology team.  Patient was counseled on the risks of not waiting further follow-up recommendation.  Patient states he feels good, does not think anything bad will happen, and wants to leave.  Patient was counseled on discharge medications, TOC pharmacy was able to expedite medications to bedside.  Patient asked for a medication to help with cravings, was provided a written prescription for naltrexone as he did not want to wait for additional medications.  Patient states he has PCP he can followup with.  Objective:    Vital Signs (last 24 hours): Vitals:   06/23/19 2107 06/23/19 2241 06/24/19 0226 06/24/19 0420  BP: 125/81   122/78  Pulse: 91 86 99 93  Resp:    16  Temp:    98.1 F (36.7 C)  TempSrc:    Oral  SpO2:    99%  Weight:      Height:       Physical Exam: General Sitting on side of bed, no acute distress  Pulmonary Breathing comfortably on room air, no cough, no distress   Neurology Alert and answers questions appropriately, no gross deficit    CBC Latest Ref Rng & Units 06/24/2019 06/23/2019 06/22/2019  WBC 4.0 - 10.5 K/uL 6.1 4.1 3.5(L)  Hemoglobin 13.0 - 17.0 g/dL 2.0(U) 5.4(Y) 7.8(L)  Hematocrit 39.0 - 52.0 % 28.8(L) 29.2(L) 26.3(L)  Platelets 150 - 400 K/uL 276 230 223   BMP Latest Ref Rng & Units 06/24/2019 06/23/2019 06/22/2019  Glucose 70 - 99 mg/dL 706(C) 98 90  BUN 6 - 20 mg/dL 8 5(L) <3(J)  Creatinine 0.61 - 1.24 mg/dL 6.28 3.15 1.76  Sodium 135 - 145 mmol/L 136 137 136  Potassium 3.5 - 5.1 mmol/L 3.7 4.0 3.4(L)  Chloride 98 - 111 mmol/L 103 102 100  CO2 22 - 32 mmol/L 23 26 27   Calcium 8.9 - 10.3 mg/dL 9.3 9.3 )   Mag: 1.7  Assessment/Plan:   Active Problems:   Symptomatic anemia   Chronic alcoholism (HCC)   Alcohol withdrawal syndrome with complication (HCC)  Elevated AST (SGOT)   Nausea and vomiting in adult   Hematemesis with nausea   Melena   Gastritis and gastroduodenitis   AVM (arteriovenous malformation) of small bowel, acquired  Patient is a 54 year old male with past medical history significant for hypertension who presented on 06/21/2019 requesting alcohol detoxification in setting of 2 days of increased nausea and vomiting, small-volume hematemesis.  # Iron deficiency anemia # Hematemesis # Alcoholic gastritis On presentation, patient reported 1 episode of small amount of blood in vomit that morning.  Patient reports occasional incidence of blood in his vomit chronically.  Patient also reported 1 episode of dark stools morning of presentation, FOBT was negative.  Hemoglobin low compared to baseline, iron studies consistent with iron deficiency anemia.  Patient given 1 dose Feraheme on 4/27. EGD yesterday revealed gastritis, and angioectasias in duodenum. *Continue iron supplementation *Omeprazole 20 mg twice daily  # Alcohol use disorder # Alcoholic liver disease Patient with heavy, chronic alcohol use, risk for withdrawals, alcohol 117 on presentation. LFT abnormalities consistent with alcoholic liver disease, but mostly preserved synthetic function, steatosis and mild hepatomegaly on abdominal ultrasound. Hepatitis B, C negative. *Discharge to complete librium taper *Prescriptions for thiamine + folate + multivitamin *Naltrexone prescription, PCP followup  # Tobacco use  disorder: Outpatient followup  # Elevated blood pressure:Blood pressure elevated on presentation, now WNL  Dispo: Today  Jeanmarie Hubert, MD 06/24/2019, 7:41 AM

## 2019-06-24 NOTE — Discharge Summary (Signed)
Name: Samuel Blevins MRN: [id number] DOB: 1965/10/21 54 y.o. PCP: Patient, No Pcp Per  Date of Admission: 06/21/2019  9:24 AM Date of Discharge: 06/24/2019 Attending Physician: Debe Coder, MD FACP  Discharge Diagnosis: 1. 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 Iron deficiency anemia 3. Hematemesis 4. Alcohol use disorder 5. Alcoholic liver disease 6. Tobacco use disorder  Discharge Medications: Allergies as of 06/24/2019   No Known Allergies     Medication List    STOP taking these medications   acetaminophen 325 MG tablet Commonly known as: TYLENOL   albuterol 108 (90 Base) MCG/ACT inhaler Commonly known as: VENTOLIN HFA   levofloxacin 750 MG tablet Commonly known as: LEVAQUIN   methocarbamol 500 MG tablet Commonly known as: ROBAXIN     TAKE these medications   chlordiazePOXIDE 25 MG capsule Commonly known as: LIBRIUM Take 1 tablet (25 mg) this evening and 1 tablet (25 mg) tomorrow morning   ferrous sulfate 325 (65 FE) MG tablet Take 1 tablet (325 mg total) by mouth daily.   folic acid 1 MG tablet Commonly known as: FOLVITE Take 1 tablet (1 mg total) by mouth daily.   multivitamin with minerals Tabs tablet Take 1 tablet by mouth daily.   naltrexone 50 MG tablet Commonly known as: DEPADE Take 1 tablet (50 mg total) by mouth daily.   omeprazole 20 MG capsule Commonly known as: PRILOSEC Take 1 capsule (20 mg total) by mouth 2 (two) times daily before a meal.   thiamine 100 MG tablet Take 1 tablet (100 mg total) by mouth daily.       Disposition and follow-up:   [name] was discharged from [facility] in Stable condition.  At the hospital follow up visit please address:  1.  Evaluate patient for continued alcohol cessation. Patient was provided 30-day prescription for naltrexone at discharge, consider providing ongoing prescription if beneficial 2. Check CBC to evaluate stability of hemoglobin, assess for signs/symptoms of  bleeding 3. Patient needs colonoscopy. Please ensure patient has followup with GI  2.  Labs / imaging needed at time of follow-up: CBC to evaluate hemoglobin  3.  Pending labs/ test needing follow-up: Biopsy results from EGD  Follow-up Appointments: Follow-up Information    [facility]. Go on 07/01/2019.   Why: You have been scheduled for a Primary Care Visit with [facility] on Jul 01, 2019 at 1:30 pm. Contact information: [address] [place] [address] [phone]       [facility] Follow up.   Why: Please call for a followup appointment          Hospital Course by problem list: # Alcoholic gastritis # Iron deficiency anemia # Hematemesis Patient is a 54 year old male with past medical history significant for hypertension who presented on 06/21/2019 requesting alcohol detoxification in the setting of 2 days of increased nausea, vomiting, small-volume hematemesis.  Patient reported chronic history of blood in vomit, no history of significant past GI bleed.  Patient also reported 1 day of dark stools, FOBT was negative on presentation.  Hemoglobin was 8.0 (MCV 81.9) on presentation from 12.6 in June 2020.  Patient with prior history of anemia, hemoglobin 9.3 in November 2017.  Iron studies were consistent with iron deficiency anemia with low serum iron (35), normal-high TIBC (438), low saturation (8), and low ferritin (20).  Patient was given 1 dose of IV Feraheme, started on p.o. iron supplementation.  On presentation, patient was  offered but declined blood transfusion due to concerns regarding infection risk.  Patient was started on IV pantoprazole, GI was consulted.  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 Mucosal changes in the duodenum were also biopsied.  Patient was discharged on omeprazole 20 mg twice  daily, plan for 4 weeks of therapy.  Patient will need outpatient colonoscopy for colorectal cancer screening.  Repeat EGD with push enteroscopy is recommended if concerns for rebleeding in the future.  If duodenal biopsies demonstrate adenoma, will require repeat EGD with endoscopic mucosal resection.  Hemoglobin remained stable during hospitalization, was 8.5 on day of discharge.  # Alcohol use disorder Patient with chronic, heavy alcohol use, reported drinking over 1 L of liquor daily, longest period of cessation less than 12 hours.  LFTs elevated (AST 106, ALT 43) on presentation consistent with alcoholic liver disease.  Laboratory studies demonstrated mostly preserved synthetic function with INR 1.0, platelets of 228, albumin mildly low at 3.4.  Hepatitis B and C studies were negative.  Patient was started on Librium taper due to high risk of severe withdrawal symptoms, placed on CIWA with Ativan. Patient was without severe withdrawal symptoms during hospitalization. On day of discharge, patient was without significant withdrawal symptoms, and was discharged to complete Librium taper.  He was provided a written prescription for naltrexone and instructions to follow-up with PCP.  Discharge Vitals:   BP 122/78 (BP Location: Left Arm)   Pulse 93   Temp 98.1 F (36.7 C) (Oral)   Resp 16   Ht 5\' 9"  (1.753 m)   Wt 77.1 kg   SpO2 99%   BMI 25.10 kg/m   Pertinent Labs, Studies, and Procedures:  CBC Latest Ref Rng & Units 06/24/2019 06/23/2019 06/22/2019  WBC 4.0 - 10.5 K/uL 6.1 4.1 3.5(L)  Hemoglobin 13.0 - 17.0 g/dL 06/24/2019) 0.3(K) 7.8(L)  Hematocrit 39.0 - 52.0 % 28.8(L) 29.2(L) 26.3(L)  Platelets 150 - 400 K/uL 276 230 223   BMP Latest Ref Rng & Units 06/24/2019 06/23/2019 06/22/2019  Glucose 70 - 99 mg/dL 06/24/2019) 98 90  BUN 6 - 20 mg/dL 8 5(L) 595(G)  Creatinine 0.61 - 1.24 mg/dL <3(O 7.56 4.33  Sodium 135 - 145 mmol/L 136 137 136  Potassium 3.5 - 5.1 mmol/L 3.7 4.0 3.4(L)  Chloride 98 - 111  mmol/L 103 102 100  CO2 22 - 32 mmol/L 23 26 27   Calcium 8.9 - 10.3 mg/dL 9.3 9.3 2.95)   Hepatitis B Surface Ag: Non-Reactive Hepatitis C Antibody: Non-Reactive  Complete Abdominal Ultrasound (06/21/19): IMPRESSION: *Fatty infiltration of the liver. *Mild hepatomegaly. *No other focal abnormality is seen.  Discharge Instructions: Discharge Instructions    Call MD for:  persistant nausea and vomiting   Complete by: As directed    Call MD for:  severe uncontrolled pain   Complete by: As directed    Call MD for:  temperature >100.4   Complete by: As directed    Diet - low sodium heart healthy   Complete by: As directed    Discharge instructions   Complete by: As directed    You were seen in the hospital for alcohol withdrawal symptoms and for blood in your vomit. Here are my recommendations:  1) Please take all your medications as prescribed  2) Please take naltrexone as prescribed to help prevent alcohol cravings  3) Please followup with your primary care doctor and with the gastroenterologist  Thank you for  allowing [place] to be part of your medical care!   Increase activity slowly   Complete by: As directed       Signed: Jeanmarie Hubert, MD 06/24/2019, 11:16 AM

## 2019-06-24 NOTE — Progress Notes (Signed)
PT Cancellation Note  Patient Details Name: Samuel Blevins MRN: [id number] DOB: Sep 28, 1965   Cancelled Treatment:    Reason Eval/Treat Not Completed: PT screened, no needs identified, will sign off. Independent with mobility, preparing to d/c.  Ina Homes, PT, DPT Acute Rehabilitation Services  Pager [phone] Office [phone]  Malachy Chamber 06/24/2019, 8:50 AM

## 2019-06-24 NOTE — Progress Notes (Signed)
CCMD called pt sustained an elevated HR in the 120-130s. Pt was moving around, shortly after pt HR went back down to the 80s. Handed information off to oncoming RN.

## 2019-06-24 NOTE — Care Management (Signed)
The primary nurse, Marcelino Duster, RN, called from [place] and noted that the patient was planning to leave AMA and take the city bus back to the shelter.  The primary physician is aware and pharmacy notified.  The pharmacy will deliver the patient's meds to the room prior to the patient leaving.  The costs of the medications are 30 dollars - Patient placed in MATCH by Sidney Ace, CMRN and copay was overrided.  Patient's nurse gave the patient a bus ticket home for discharge.

## 2019-06-24 NOTE — Progress Notes (Signed)
Pt given his discharge instructions and gone over with him. He verbalized understanding. Medications delivered to room and given to pt from [facility] pharmacy. Printed prescriptions given to pt. Pt gathered his own belongings. Pt requested bus ticket but then would not wait for CM to bring it to him. Pt left.

## 2019-06-24 NOTE — Progress Notes (Signed)
Pt was screened for OT needs and is independent with basic adls and at baseline. No therapy indicated at this time.  Tory Emerald, [place] [phone]

## 2019-06-24 NOTE — Progress Notes (Signed)
Pt's HR 120 -130s. RN assessed pt in room, he was fully dressed sitting in chair. RN asked pt if anything was wrong and if he felt like he was going through withdrawals. Pt agitated saying that he wanted to talk with the doctor, that he is having to beg for food and he can get food out in the streets. Pt was brought a breakfast tray this morning at 0720 but he did not want this so the NT ordered him another tray but he continues to say that this wont be here till 10am and he hopes to be out of here before then. RN attempted to assure pt that he would not have to wait that long and offered snacks but pt refused and continuously asked to see MD. MD made aware.

## 2019-06-25 LAB — TYPE AND SCREEN
ABO/RH(D): A POS
Antibody Screen: NEGATIVE
Unit division: 0

## 2019-06-25 LAB — BPAM RBC
Blood Product Expiration Date: 202105252359
Unit Type and Rh: 6200

## 2019-06-25 LAB — SURGICAL PATHOLOGY

## 2019-06-25 NOTE — Progress Notes (Signed)
Late entry: Pt had yellow MEWS from [facility] for HR of 125. RN attempted to get full set of vitals to confirm but pt refused. At the time pt was packing up his belongings to leave AMA. RN notified MD of HR and pt wanting to leave. RN able to convince him to wait for MD to come see him. MD ended up discharging him.

## 2019-06-28 ENCOUNTER — Other Ambulatory Visit: Payer: Self-pay | Admitting: Gastroenterology

## 2019-06-28 DIAGNOSIS — K2970 Gastritis, unspecified, without bleeding: Secondary | ICD-10-CM

## 2019-06-28 DIAGNOSIS — B9681 Helicobacter pylori [H. pylori] as the cause of diseases classified elsewhere: Secondary | ICD-10-CM

## 2019-06-28 MED ORDER — DOXYCYCLINE HYCLATE 100 MG PO TABS
100.0000 mg | ORAL_TABLET | Freq: Two times a day (BID) | ORAL | 0 refills | Status: AC
Start: 2019-06-28 — End: 2019-07-12

## 2019-06-28 MED ORDER — BISMUTH SUBSALICYLATE 262 MG PO CHEW
524.0000 mg | CHEWABLE_TABLET | Freq: Four times a day (QID) | ORAL | 0 refills | Status: AC
Start: 2019-06-28 — End: 2019-07-12

## 2019-06-28 MED ORDER — METRONIDAZOLE 250 MG PO TABS
250.0000 mg | ORAL_TABLET | Freq: Four times a day (QID) | ORAL | 0 refills | Status: AC
Start: 2019-06-28 — End: 2019-07-12

## 2019-07-01 ENCOUNTER — Inpatient Hospital Stay: Payer: Self-pay | Admitting: Critical Care Medicine

## 2019-07-01 NOTE — Progress Notes (Deleted)
Subjective:    Patient ID: Samuel Blevins, male    DOB: 10/10/1965, 54 y.o.   MRN: [id number]  54 y.o.M ETOH withdrawal just d/c from hosp one week ago  Date of Admission: 06/21/2019  9:24 AM Date of Discharge: 06/24/2019 Attending Physician: Gilles Chiquito, MD FACP  Discharge Diagnosis: 1. 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 Iron deficiency anemia 3. Hematemesis 4. Alcohol use disorder 5. Alcoholic liver disease 6. Tobacco use disorder  Discharge Medications: Allergies as of 06/24/2019  No Known Allergies   Medication List  STOP taking these medications  acetaminophen 325 MG tablet Commonly known as: TYLENOL  albuterol 108 (90 Base) MCG/ACT inhaler Commonly known as: VENTOLIN HFA  levofloxacin 750 MG tablet Commonly known as: LEVAQUIN  methocarbamol 500 MG tablet Commonly known as: ROBAXIN   TAKE these medications  chlordiazePOXIDE 25 MG capsule Commonly known as: LIBRIUM Take 1 tablet (25 mg) this evening and 1 tablet (25 mg) tomorrow morning  ferrous sulfate 325 (65 FE) MG tablet Take 1 tablet (325 mg total) by mouth daily.  folic acid 1 MG tablet Commonly known as: FOLVITE Take 1 tablet (1 mg total) by mouth daily.  multivitamin with minerals Tabs tablet Take 1 tablet by mouth daily.  naltrexone 50 MG tablet Commonly known as: DEPADE Take 1 tablet (50 mg total) by mouth daily.  omeprazole 20 MG capsule Commonly known as: PRILOSEC Take 1 capsule (20 mg total) by mouth 2 (two) times daily before a meal.  thiamine 100 MG tablet Take 1 tablet (100 mg total) by mouth daily.     Disposition and follow-up:   [name] was discharged from [facility] in Stable condition.  At the hospital follow up visit please address:  1.  Evaluate patient for continued alcohol cessation. Patient was provided 30-day prescription for naltrexone at discharge, consider providing ongoing prescription if beneficial 2. Check  CBC to evaluate stability of hemoglobin, assess for signs/symptoms of bleeding 3. Patient needs colonoscopy. Please ensure patient has followup with GI  2.  Labs / imaging needed at time of follow-up: CBC to evaluate hemoglobin  3.  Pending labs/ test needing follow-up: Biopsy results from EGD   Hospital Course by problem list: # Alcoholic gastritis # Iron deficiency anemia # Hematemesis Patient is a 54 year old male with past medical history significant for hypertension who presented on 06/21/2019 requesting alcohol detoxification in the setting of 2 days of increased nausea, vomiting, small-volume hematemesis.  Patient reported chronic history of blood in vomit, no history of significant past GI bleed.  Patient also reported 1 day of dark stools, FOBT was negative on presentation.  Hemoglobin was 8.0 (MCV 81.9) on presentation from 12.6 in June 2020.  Patient with prior history of anemia, hemoglobin 9.3 in November 2017.  Iron studies were consistent with iron deficiency anemia with low serum iron (35), normal-high TIBC (438), low saturation (8), and low ferritin (20).  Patient was given 1 dose of IV Feraheme, started on p.o. iron supplementation.  On presentation, patient was offered but declined blood transfusion due to concerns regarding infection risk.  Patient was started on IV pantoprazole, GI was consulted.  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 Mucosal changes in the duodenum were also biopsied.  Patient was discharged on omeprazole 20 mg twice daily, plan for 4 weeks of therapy.  Patient will need outpatient colonoscopy for colorectal cancer screening.  Repeat EGD with push  enteroscopy is recommended if concerns for rebleeding in the future.  If duodenal biopsies demonstrate adenoma, will require repeat EGD with endoscopic mucosal resection.  Hemoglobin remained stable during hospitalization,  was 8.5 on day of discharge.  # Alcohol use disorder Patient with chronic, heavy alcohol use, reported drinking over 1 L of liquor daily, longest period of cessation less than 12 hours.  LFTs elevated (AST 106, ALT 43) on presentation consistent with alcoholic liver disease.  Laboratory studies demonstrated mostly preserved synthetic function with INR 1.0, platelets of 228, albumin mildly low at 3.4.  Hepatitis B and C studies were negative.  Patient was started on Librium taper due to high risk of severe withdrawal symptoms, placed on CIWA with Ativan. Patient was without severe withdrawal symptoms during hospitalization. On day of discharge, patient was without significant withdrawal symptoms, and was discharged to complete Librium taper.  He was provided a written prescription for naltrexone and instructions to follow-up with PCP.        Review of Systems     Objective:   Physical Exam There were no vitals filed for this visit.  Gen: Pleasant, well-nourished, in no distress,  normal affect  ENT: No lesions,  mouth clear,  oropharynx clear, no postnasal drip  Neck: No JVD, no TMG, no carotid bruits  Lungs: No use of accessory muscles, no dullness to percussion, clear without rales or rhonchi  Cardiovascular: RRR, heart sounds normal, no murmur or gallops, no peripheral edema  Abdomen: soft and NT, no HSM,  BS normal  Musculoskeletal: No deformities, no cyanosis or clubbing  Neuro: alert, non focal  Skin: Warm, no lesions or rashes  No results found.   Pertinent Labs, Studies, and Procedures:  CBC Latest Ref Rng & Units 06/24/2019 06/23/2019 06/22/2019 WBC 4.0 - 10.5 K/uL 6.1 4.1 3.5(L) Hemoglobin 13.0 - 17.0 g/dL 4.3(P) 2.9(J) 7.8(L) Hematocrit 39.0 - 52.0 % 28.8(L) 29.2(L) 26.3(L) Platelets 150 - 400 K/uL 276 230 223  BMP Latest Ref Rng & Units 06/24/2019 06/23/2019 06/22/2019 Glucose 70 - 99 mg/dL 188(C) 98 90 BUN 6 - 20 mg/dL 8 5(L) <1(Y) Creatinine 0.61 - 1.24  mg/dL 6.06 3.01 6.01 Sodium 093 - 145 mmol/L 136 137 136 Potassium 3.5 - 5.1 mmol/L 3.7 4.0 3.4(L) Chloride 98 - 111 mmol/L 103 102 100 CO2 22 - 32 mmol/L 23 26 27  Calcium 8.9 - 10.3 mg/dL 9.3 9.3 )  Hepatitis B Surface Ag: Non-Reactive Hepatitis C Antibody: Non-Reactive  Complete Abdominal Ultrasound (06/21/19): IMPRESSION: *Fatty infiltration of the liver. *Mild hepatomegaly. *No other focal abnormality is seen.         Assessment & Plan:

## 2019-07-08 ENCOUNTER — Telehealth: Payer: Self-pay | Admitting: Internal Medicine

## 2019-07-08 NOTE — Telephone Encounter (Signed)
Attempted to call Samuel Blevins regarding biopsy result from his EGD and need to f/u with PCP or GI. [name] did not pick up. Voicemail left with callback number.

## 2019-07-13 ENCOUNTER — Telehealth: Payer: Self-pay | Admitting: Internal Medicine

## 2019-07-13 NOTE — Telephone Encounter (Signed)
Attempted to reach patient regarding his EGD biopsy results. Patient did not answer phone. Left message on voicemail requesting him to call back.  Katherine Roan, MD 07/13/2019, 1:32 PM

## 2019-08-25 ENCOUNTER — Encounter: Payer: Self-pay | Admitting: Critical Care Medicine

## 2019-08-26 NOTE — Progress Notes (Signed)
Patient ID: Samuel Blevins, male   DOB: Sep 08, 1965, 54 y.o.   MRN: [id number] This patient was seen in return follow-up at the [facility] complaining of shoulder and arm pain.  On exam the shoulder has full range of motion but there is tenderness and tightness in the trapezius muscle from the neck to the shoulder area and tenderness along the acromioclavicular joint  Impression is that of musculoskeletal pain of the right shoulder  Plan will be to give the patient over-the-counter Voltaren gel which she will apply 3 times daily and also a over-the-counter bottle of ibuprofen was given to the patient to take as needed

## 2019-08-28 ENCOUNTER — Emergency Department (HOSPITAL_COMMUNITY)
Admission: EM | Admit: 2019-08-28 | Discharge: 2019-08-28 | Disposition: A | Discharge status: Home / Self Care | Payer: Self-pay | Attending: Emergency Medicine | Admitting: Emergency Medicine

## 2019-08-28 ENCOUNTER — Encounter (HOSPITAL_COMMUNITY): Payer: Self-pay | Admitting: Emergency Medicine

## 2019-08-28 ENCOUNTER — Other Ambulatory Visit: Payer: Self-pay

## 2019-08-28 DIAGNOSIS — Z79899 Other long term (current) drug therapy: Secondary | ICD-10-CM | POA: Insufficient documentation

## 2019-08-28 DIAGNOSIS — X500XXA Overexertion from strenuous movement or load, initial encounter: Secondary | ICD-10-CM | POA: Insufficient documentation

## 2019-08-28 DIAGNOSIS — Y9389 Activity, other specified: Secondary | ICD-10-CM | POA: Insufficient documentation

## 2019-08-28 DIAGNOSIS — Y990 Civilian activity done for income or pay: Secondary | ICD-10-CM | POA: Insufficient documentation

## 2019-08-28 DIAGNOSIS — M25511 Pain in right shoulder: Secondary | ICD-10-CM | POA: Insufficient documentation

## 2019-08-28 DIAGNOSIS — F17200 Nicotine dependence, unspecified, uncomplicated: Secondary | ICD-10-CM | POA: Insufficient documentation

## 2019-08-28 DIAGNOSIS — M542 Cervicalgia: Secondary | ICD-10-CM | POA: Insufficient documentation

## 2019-08-28 DIAGNOSIS — Y9289 Other specified places as the place of occurrence of the external cause: Secondary | ICD-10-CM | POA: Insufficient documentation

## 2019-08-28 DIAGNOSIS — I10 Essential (primary) hypertension: Secondary | ICD-10-CM | POA: Insufficient documentation

## 2019-08-28 NOTE — ED Provider Notes (Signed)
[facility] EMERGENCY DEPARTMENT Provider Note   CSN: [id number] Arrival date & time: 08/28/19  0005     History Chief Complaint  Patient presents with  . Shoulder Pain    Samuel Blevins is a 54 y.o. male.  HPI   Patient presents to the emergency department with chief complaint of right shoulder pain that started on Monday.  Patient states the pain is constant and he feels it radiating into his neck.  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  He has been taking over-the-counter ibuprofen without relief and admits that moving its makes the pain worse.  He denies numbness or tingling in his arm and has full range of motion in his arm.  Patient requests a cortisone shot and left work to come here for it.  Patient has significant medical history of tobacco use, hypertension, and alcohol use.  Patient denies headache, fever, chills, sore throat, shortness of breath, chest pain, nausea, vomiting pedal edema.  Past Medical History:  Diagnosis Date  . Hypertension     Patient Active Problem List   Diagnosis Date Noted  . Gastritis and gastroduodenitis   . AVM (arteriovenous malformation) of small bowel, acquired   . Alcohol withdrawal syndrome with complication (HCC)   . Elevated AST (SGOT)   . Nausea and vomiting in adult   . Hematemesis with nausea   . Melena   . Chronic alcoholism (HCC) 06/21/2019  . Acute respiratory failure with hypoxia (HCC) 11/23/2015  . Symptomatic anemia 11/23/2015  . Acute respiratory failure (HCC) 11/23/2015  . Lobar pneumonia, unspecified organism (HCC)   . Bronchitis 11/22/2015  . Tobacco abuse 03/17/2014  . Essential hypertension 03/17/2014    Past Surgical History:  Procedure Laterality Date  . BIOPSY  06/23/2019   Procedure: BIOPSY;  Surgeon: Shellia Cleverly, DO;  Location: [facility] ENDOSCOPY;  Service: Gastroenterology;;  .  ESOPHAGOGASTRODUODENOSCOPY (EGD) WITH PROPOFOL N/A 06/23/2019   Procedure: ESOPHAGOGASTRODUODENOSCOPY (EGD) WITH PROPOFOL;  Surgeon: Shellia Cleverly, DO;  Location: [facility] ENDOSCOPY;  Service: Gastroenterology;  Laterality: N/A;  . HOT HEMOSTASIS N/A 06/23/2019   Procedure: HOT HEMOSTASIS (ARGON PLASMA COAGULATION/BICAP);  Surgeon: Shellia Cleverly, DO;  Location: [facility] ENDOSCOPY;  Service: Gastroenterology;  Laterality: N/A;       Family History  Problem Relation Age of Onset  . Diabetes Mellitus II Neg Hx     Social History   Tobacco Use  . Smoking status: Current Every Day Smoker    Packs/day: 1.00    Years: 30.00    Pack years: 30.00  . Smokeless tobacco: Never Used  Vaping Use  . Vaping Use: Never used  Substance Use Topics  . Alcohol use: Yes    Alcohol/week: 24.0 standard drinks    Types: 24 Cans of beer per week    Comment: 6 cans of beer per day after getting off work  . Drug use: Never    Home Medications Prior to Admission medications   Medication Sig Start Date End Date Taking? Authorizing Provider  Aspirin-Salicylamide-Caffeine (ARTHRITIS STRENGTH BC POWDER PO) Take 1 Package by mouth 2 (two) times daily as needed (Pain).   Yes [provider]  diclofenac Sodium (VOLTAREN) 1 % GEL Apply 2-4 g topically 4 (four) times daily.   Yes [provider]  ibuprofen (ADVIL) 200 MG tablet Take 400 mg by mouth every 6 (six) hours as needed for moderate  pain.   Yes [provider]  chlordiazePOXIDE (LIBRIUM) 25 MG capsule Take 1 tablet (25 mg) this evening and 1 tablet (25 mg) tomorrow morning 06/24/19   Katherine Roan, MD  ferrous sulfate 325 (65 FE) MG tablet Take 1 tablet (325 mg total) by mouth daily. 06/24/19   Katherine Roan, MD  folic acid (FOLVITE) 1 MG tablet Take 1 tablet (1 mg total) by mouth daily. 06/24/19   Katherine Roan, MD  Multiple Vitamin (MULTIVITAMIN WITH MINERALS) TABS tablet Take 1 tablet by mouth daily. 06/24/19   Katherine Roan, MD  naltrexone (DEPADE) 50 MG tablet Take 1 tablet (50 mg total) by mouth daily. 06/24/19   Theotis Barrio, MD  omeprazole (PRILOSEC) 20 MG capsule Take 1 capsule (20 mg total) by mouth 2 (two) times daily before a meal. 06/24/19   Katherine Roan, MD  thiamine 100 MG tablet Take 1 tablet (100 mg total) by mouth daily. 06/24/19   Katherine Roan, MD    Allergies    Patient has no known allergies.  Review of Systems   Review of Systems  Constitutional: Negative for chills and fever.  HENT: Negative for congestion and sore throat.   Eyes: Negative for pain and redness.  Respiratory: Negative for cough and shortness of breath.   Cardiovascular: Negative for chest pain and leg swelling.  Gastrointestinal: Negative for abdominal pain, diarrhea, nausea and vomiting.  Genitourinary: Negative for enuresis, flank pain and frequency.  Musculoskeletal: Negative for back pain.       Admits to right shoulder pain.  Skin: Negative for rash.  Neurological: Negative for dizziness and headaches.  Hematological: Does not bruise/bleed easily.    Physical Exam Updated Vital Signs BP 126/72 (BP Location: Right Arm)   Pulse (!) 102   Temp 98.2 F (36.8 C) (Oral)   Resp 18   SpO2 99%   Physical Exam Vitals and nursing note reviewed.  Constitutional:      General: He is not in acute distress.    Appearance: Normal appearance. He is not ill-appearing or diaphoretic.  HENT:     Head: Normocephalic and atraumatic.     Nose: No congestion or rhinorrhea.  Eyes:     General: No scleral icterus.       Right eye: No discharge.        Left eye: No discharge.     Conjunctiva/sclera: Conjunctivae normal.  Pulmonary:     Effort: Pulmonary effort is normal. No respiratory distress.     Breath sounds: Normal breath sounds. No wheezing.  Musculoskeletal:     Cervical back: Neck supple.     Right lower leg: No edema.     Left lower leg: No edema.     Comments: Patient's right shoulder was  visualized, no edema, ecchymosis, rash, abrasions or other gross abnormalities noted.  Patient had full range of motion in his shoulder and, 5 out of 5 strength, strong radial pulse, good capillary refill, sensory fully intact denies paresthesia.  Patient had no difficulty moving his neck full range of motion.  Skin:    General: Skin is warm and dry.     Capillary Refill: Capillary refill takes less than 2 seconds.     Coloration: Skin is not jaundiced or pale.  Neurological:     Mental Status: He is alert and oriented to person, place, and time.  Psychiatric:        Mood and Affect: Mood normal.     ED Results / Procedures /  Treatments   Labs (all labs ordered are listed, but only abnormal results are displayed) Labs Reviewed - No data to display  EKG None  Radiology No results found.  Procedures Procedures (including critical care time)  Medications Ordered in ED Medications - No data to display  ED Course  I have reviewed the triage vital signs and the nursing notes.  Pertinent labs & imaging results that were available during my care of the patient were reviewed by me and considered in my medical decision making (see chart for details).    MDM Rules/Calculators/A&P                          I have personally reviewed all imaging, labs and have interpreted them.  Most concern for fractures, septic arthritis, nerve impingement.  Unlikely patient suffering from a fracture as physical exam was unremarkable, no gross abnormalities noted, patient denies recent trauma to the area, patient had full range of motion and 5-5 strength, imaging was not indicated.  Unlikely patient suffering from septic arthritis as patient denies IV drug use, skin exam did not show abnormalities no track marks noted, shoulder was nonerythematous, no rash, was not warm to the touch.  Unlikely patient is suffering from nerve impingement as patient denies paresthesia and neurovascular was fully intact in  right arm.  Patient was nontoxic-appearing, vital signs reassuring, further lab work and imaging was not indicated.  Patient appears resting comfortably in bed, showing no signs acute distress.  Patient does not meet criteria to be admitted to the hospital.  Likely patient suffered a muscle strain, recommend patient takes over-the-counter pain meds and anti-inflammatories and follows up with orthopedic for further evaluation management.  Patient was discussed with attending who agrees with assessment and plan.  Patient was given at home instruction as well as strict return precautions.  Patient verbalized that he understood and agrees with said plan. Final Clinical Impression(s) / ED Diagnoses Final diagnoses:  Acute pain of right shoulder  Neck pain    Rx / DC Orders ED Discharge Orders    None       Carroll Sage, PA-C 08/28/19 0102    Palumbo, April, MD 08/28/19 [id number]

## 2019-08-28 NOTE — ED Triage Notes (Addendum)
Patient here with right shoulder pain.  Patient states that it started with lifting boxes.  Patient states he is here from work, looking for a shot to put in his shoulder so he can get back to work.  Patient denies any chest pain or shortness of breath, does not want an xray.

## 2019-08-28 NOTE — ED Notes (Signed)
Pt discharged, left prior to receiving discharge papers.

## 2019-08-28 NOTE — Discharge Instructions (Addendum)
You have been seen here for right shoulder pain.  I recommend that you alternate between taking ibuprofen and Tylenol every 6 hours for pain.  For example you can take Tylenol first wait 6 hours then take ibuprofen wait another 6 hours and repeat.  Please follow the dosage and on back of bottle.  I also recommend that you apply ice to the area as this will help with inflammation and swelling.  Do not place ice on bare skin as this can cause a burn.  I have given you information to follow-up with orthopedic please schedule an appointment with them for further evaluation management of your shoulder pain.  I want you to come back to the emergency department if you develop chest pain, shortness of breath, numbness or tingling in your arm, your arm turns different color, you have uncontrolled nausea, vomiting, diarrhea as these symptoms require further evaluation.

## 2019-10-20 ENCOUNTER — Emergency Department (HOSPITAL_COMMUNITY): Payer: 59

## 2019-10-20 ENCOUNTER — Emergency Department (HOSPITAL_COMMUNITY)
Admission: EM | Admit: 2019-10-20 | Discharge: 2019-10-20 | Disposition: A | Discharge status: Home / Self Care | Payer: 59 | Attending: Emergency Medicine | Admitting: Emergency Medicine

## 2019-10-20 ENCOUNTER — Encounter (HOSPITAL_COMMUNITY): Payer: Self-pay | Admitting: Emergency Medicine

## 2019-10-20 DIAGNOSIS — Z79899 Other long term (current) drug therapy: Secondary | ICD-10-CM | POA: Insufficient documentation

## 2019-10-20 DIAGNOSIS — K862 Cyst of pancreas: Secondary | ICD-10-CM | POA: Diagnosis not present

## 2019-10-20 DIAGNOSIS — R109 Unspecified abdominal pain: Secondary | ICD-10-CM | POA: Diagnosis present

## 2019-10-20 DIAGNOSIS — I10 Essential (primary) hypertension: Secondary | ICD-10-CM | POA: Diagnosis not present

## 2019-10-20 DIAGNOSIS — K469 Unspecified abdominal hernia without obstruction or gangrene: Secondary | ICD-10-CM | POA: Insufficient documentation

## 2019-10-20 DIAGNOSIS — K458 Other specified abdominal hernia without obstruction or gangrene: Secondary | ICD-10-CM

## 2019-10-20 DIAGNOSIS — F17200 Nicotine dependence, unspecified, uncomplicated: Secondary | ICD-10-CM | POA: Diagnosis not present

## 2019-10-20 LAB — COMPREHENSIVE METABOLIC PANEL
ALT: 31 U/L (ref 0–44)
AST: 28 U/L (ref 15–41)
Albumin: 3.5 g/dL (ref 3.5–5.0)
Alkaline Phosphatase: 80 U/L (ref 38–126)
Anion gap: 9 (ref 5–15)
BUN: 6 mg/dL (ref 6–20)
CO2: 22 mmol/L (ref 22–32)
Calcium: 8.5 mg/dL — ABNORMAL LOW (ref 8.9–10.3)
Chloride: 104 mmol/L (ref 98–111)
Creatinine, Ser: 0.68 mg/dL (ref 0.61–1.24)
GFR calc Af Amer: >60 mL/min (ref >60)
GFR calc non Af Amer: >60 mL/min (ref >60)
Glucose, Bld: 84 mg/dL (ref 70–99)
Potassium: 3.2 mmol/L — ABNORMAL LOW (ref 3.5–5.1)
Sodium: 135 mmol/L (ref 135–145)
Total Bilirubin: 1.4 mg/dL — ABNORMAL HIGH (ref 0.3–1.2)
Total Protein: 8 g/dL (ref 6.5–8.1)

## 2019-10-20 LAB — URINALYSIS, ROUTINE W REFLEX MICROSCOPIC
Bacteria, UA: NONE SEEN
Bilirubin Urine: NEGATIVE
Glucose, UA: NEGATIVE mg/dL
Hgb urine dipstick: NEGATIVE
Ketones, ur: NEGATIVE mg/dL
Leukocytes,Ua: NEGATIVE
Nitrite: NEGATIVE
Protein, ur: 30 mg/dL — AB
Specific Gravity, Urine: 1.042 — ABNORMAL HIGH (ref 1.005–1.030)
pH: 5 (ref 5.0–8.0)

## 2019-10-20 LAB — CBC
HCT: 33.5 % — ABNORMAL LOW (ref 39.0–52.0)
Hemoglobin: 10.5 g/dL — ABNORMAL LOW (ref 13.0–17.0)
MCH: 27.4 pg (ref 26.0–34.0)
MCHC: 31.3 g/dL (ref 30.0–36.0)
MCV: 87.5 fL (ref 80.0–100.0)
Platelets: 535 10*3/uL — ABNORMAL HIGH (ref 150–400)
RBC: 3.83 MIL/uL — ABNORMAL LOW (ref 4.22–5.81)
RDW: 21.5 % — ABNORMAL HIGH (ref 11.5–15.5)
WBC: 8.2 10*3/uL (ref 4.0–10.5)
nRBC: 0 % (ref 0.0–0.2)

## 2019-10-20 LAB — LIPASE, BLOOD: Lipase: 36 U/L (ref 11–51)

## 2019-10-20 MED ORDER — IOHEXOL 300 MG/ML  SOLN
100.0000 mL | Freq: Once | INTRAMUSCULAR | Status: AC | PRN
  Administered 2019-10-20: 100 mL via INTRAVENOUS

## 2019-10-20 MED ORDER — POTASSIUM CHLORIDE CRYS ER 20 MEQ PO TBCR
40.0000 meq | EXTENDED_RELEASE_TABLET | Freq: Once | ORAL | Status: AC
  Administered 2019-10-20: 40 meq via ORAL
  Filled 2019-10-20: qty 2

## 2019-10-20 MED ORDER — MORPHINE SULFATE (PF) 2 MG/ML IV SOLN
2.0000 mg | Freq: Once | INTRAVENOUS | Status: AC
  Administered 2019-10-20: 2 mg via INTRAVENOUS
  Filled 2019-10-20: qty 1

## 2019-10-20 MED ORDER — MORPHINE SULFATE (PF) 2 MG/ML IV SOLN
2.0000 mg | Freq: Once | INTRAVENOUS | Status: AC
  Administered 2019-10-20: 2 mg via INTRAMUSCULAR
  Filled 2019-10-20: qty 1

## 2019-10-20 NOTE — Discharge Instructions (Addendum)
Your lab work today and your work-up was overall reassuring.  Your CT scan today showed that you have a hernia in your abdomen without evidence of it compressing your bowel. 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  You may take Tylenol ibuprofen for pain.  Please make sure to return to the ER if your symptoms worsen.

## 2019-10-20 NOTE — ED Triage Notes (Signed)
Per [facility] pt not feeling good for a week but past 2 days abd pain gotten worse. Hx hernia. Reports distention but feels better with pressure.  142/82, 96HR, 99.3 temp, 95% on RA.

## 2019-10-20 NOTE — ED Provider Notes (Addendum)
[facility]-EMERGENCY DEPT Provider Note   CSN: [id number] Arrival date & time: 10/20/19  [id number]     History Chief Complaint  Patient presents with  . Abdominal Pain    Samuel Blevins is a 54 y.o. male.  HPI 54 year old male with a history of hypertension, alcohol abuse, AVM of the small bowel, presents to the ER with 2 days of abdominal pain.  Patient reports a history of an abdominal hernia, which he thinks "busted".  He stated he felt a sudden onset of pain around his pelvic is approximately 2 days ago.  Has reported some episodes of nonbloody nonbilious vomiting.  Denies any difficulty with bowel movements or urination, denies any blood in his stool.  He also notes that he feels like his abdomen is swollen.  Denies any fevers, endorses some chills at night.  States the pain prevents him from sleeping well.  He has not taken anything for his pain.    Past Medical History:  Diagnosis Date  . Hypertension     Patient Active Problem List   Diagnosis Date Noted  . Gastritis and gastroduodenitis   . AVM (arteriovenous malformation) of small bowel, acquired   . Alcohol withdrawal syndrome with complication (HCC)   . Elevated AST (SGOT)   . Nausea and vomiting in adult   . Hematemesis with nausea   . Melena   . Chronic alcoholism (HCC) 06/21/2019  . Acute respiratory failure with hypoxia (HCC) 11/23/2015  . Symptomatic anemia 11/23/2015  . Acute respiratory failure (HCC) 11/23/2015  . Lobar pneumonia, unspecified organism (HCC)   . Bronchitis 11/22/2015  . Tobacco abuse 03/17/2014  . Essential hypertension 03/17/2014    Past Surgical History:  Procedure Laterality Date  . BIOPSY  06/23/2019   Procedure: BIOPSY;  Surgeon: Shellia Cleverlyirigliano, Vito V, DO;  Location: [facility];  Service: Gastroenterology;;  . ESOPHAGOGASTRODUODENOSCOPY (EGD) WITH PROPOFOL N/A 06/23/2019   Procedure: ESOPHAGOGASTRODUODENOSCOPY (EGD) WITH PROPOFOL;  Surgeon: Shellia Cleverlyirigliano, Vito V, DO;   Location: [facility];  Service: Gastroenterology;  Laterality: N/A;  . HOT HEMOSTASIS N/A 06/23/2019   Procedure: HOT HEMOSTASIS (ARGON PLASMA COAGULATION/BICAP);  Surgeon: Shellia Cleverlyirigliano, Vito V, DO;  [facility];  Service: Gastroenterology;  Laterality: N/A;       Family History  Problem Relation Age of Onset  . Diabetes Mellitus II Neg Hx     Social History   Tobacco Use  . Smoking status: Current Every Day Smoker    Packs/day: 1.00    Years: 30.00    Pack years: 30.00  . Smokeless tobacco: Never Used  Vaping Use  . Vaping Use: Never used  Substance Use Topics  . Alcohol use: Yes    Alcohol/week: 24.0 standard drinks    Types: 24 Cans of beer per week    Comment: 6 cans of beer per day after getting off work  . Drug use: Never    Home Medications Prior to Admission medications   Medication Sig Start Date End Date Taking? Authorizing Provider  diclofenac Sodium (VOLTAREN) 1 % GEL Apply 2-4 g topically 4 (four) times daily.   Yes [provider]  ferrous sulfate 325 (65 FE) MG tablet Take 1 tablet (325 mg total) by mouth daily. 06/24/19  Yes Katherine RoanMacLean, Matthew, MD  folic acid (FOLVITE) 1 MG tablet Take 1 tablet (1 mg total) by mouth daily. 06/24/19  Yes Katherine RoanMacLean, Matthew, MD  ibuprofen (ADVIL) 200 MG tablet Take 400 mg by mouth every 6 (six) hours as needed for moderate  pain.   Yes [provider]  Multiple Vitamin (MULTIVITAMIN WITH MINERALS) TABS tablet Take 1 tablet by mouth daily. 06/24/19  Yes Katherine Roan, MD  omeprazole (PRILOSEC) 20 MG capsule Take 1 capsule (20 mg total) by mouth 2 (two) times daily before a meal. 06/24/19  Yes Katherine Roan, MD  thiamine 100 MG tablet Take 1 tablet (100 mg total) by mouth daily. 06/24/19  Yes Katherine Roan, MD  naltrexone (DEPADE) 50 MG tablet Take 1 tablet (50 mg total) by mouth daily. 06/24/19   Theotis Barrio, MD    Allergies    Patient has no known allergies.  Review of Systems   Review of  Systems  Constitutional: Negative for chills and fever.  HENT: Negative for ear pain and sore throat.   Eyes: Negative for pain and visual disturbance.  Respiratory: Negative for cough and shortness of breath.   Cardiovascular: Negative for chest pain and palpitations.  Gastrointestinal: Positive for abdominal pain, nausea and vomiting.  Genitourinary: Negative for dysuria and hematuria.  Musculoskeletal: Negative for arthralgias and back pain.  Skin: Negative for color change and rash.  Neurological: Negative for seizures and syncope.  All other systems reviewed and are negative.   Physical Exam Updated Vital Signs BP (!) 156/97   Pulse 86   Temp 98.2 F (36.8 C) (Oral)   Resp 18   SpO2 98%   Physical Exam Vitals and nursing note reviewed.  Constitutional:      General: He is not in acute distress.    Appearance: He is well-developed. He is not ill-appearing, toxic-appearing or diaphoretic.  HENT:     Head: Normocephalic and atraumatic.  Eyes:     Conjunctiva/sclera: Conjunctivae normal.  Cardiovascular:     Rate and Rhythm: Normal rate and regular rhythm.     Heart sounds: Normal heart sounds. No murmur heard.   Pulmonary:     Effort: Pulmonary effort is normal. No respiratory distress.     Breath sounds: Normal breath sounds.  Abdominal:     Palpations: Abdomen is soft.     Tenderness: There is generalized abdominal tenderness. There is no guarding or rebound. Negative signs include Murphy's sign, Rovsing's sign, McBurney's sign and psoas sign.     Hernia: No hernia is present.  Musculoskeletal:     Cervical back: Neck supple.  Skin:    General: Skin is warm and dry.  Neurological:     General: No focal deficit present.     Mental Status: He is alert.  Psychiatric:        Mood and Affect: Mood normal.     ED Results / Procedures / Treatments   Labs (all labs ordered are listed, but only abnormal results are displayed) Labs Reviewed  COMPREHENSIVE  METABOLIC PANEL - Abnormal; Notable for the following components:      Result Value   Potassium 3.2 (*)    Calcium 8.5 (*)    Total Bilirubin 1.4 (*)    All other components within normal limits  CBC - Abnormal; Notable for the following components:   RBC 3.83 (*)    Hemoglobin 10.5 (*)    HCT 33.5 (*)    RDW 21.5 (*)    Platelets 535 (*)    All other components within normal limits  URINALYSIS, ROUTINE W REFLEX MICROSCOPIC - Abnormal; Notable for the following components:   Color, Urine AMBER (*)    Specific Gravity, Urine 1.042 (*)    Protein, ur 30 (*)  All other components within normal limits  LIPASE, BLOOD    EKG None  Radiology CT ABDOMEN PELVIS W CONTRAST  Result Date: 10/20/2019 CLINICAL DATA:  Abdominal pain. Hernia suspected. History of hernia. EXAM: CT ABDOMEN AND PELVIS WITH CONTRAST TECHNIQUE: Multidetector CT imaging of the abdomen and pelvis was performed using the standard protocol following bolus administration of intravenous contrast. CONTRAST:  OMNIPAQUE IOHEXOL 300 MG/ML  SOLN COMPARISON:  Abdominal ultrasound 06/21/2019. FINDINGS: Lower chest: Mild hypoventilatory changes in both lung bases. Subsegmental atelectasis in the lingula and right middle lobe. Hepatobiliary: Diffusely decreased hepatic density consistent with steatosis. There is mild focal fatty sparing adjacent the gallbladder fossa. More focal fatty infiltration adjacent to the falciform ligament. No evidence of focal hepatic lesion. Unremarkable gallbladder without calcified gallstone or pericholecystic inflammation. Pancreas: Multilobulated cystic process either within or adjacent to the pancreatic head, series 2, image 32, measuring 3.7 x 2.4 cm. There is mild adjacent edema/peripancreatic fat stranding. There is no ductal dilatation. Distal pancreas is unremarkable. Spleen: Normal in size without focal abnormality. Adrenals/Urinary Tract: Normal adrenal glands. No hydronephrosis or perinephric  edema. Homogeneous renal enhancement with symmetric excretion on delayed phase imaging. Urinary bladder is physiologically distended without wall thickening. Stomach/Bowel: Bowel evaluation is limited in the absence of enteric contrast. Nondistended stomach. Difficult to assess for out duodenal wall thickening given adjacent pancreatic abnormality. There is no small bowel obstruction. Distal small bowel loops are mildly fluid-filled but nondilated. Appendix not definitively visualized, no evidence of appendicitis. Small volume of colonic stool. Descending colon is redundant. No colonic wall thickening or inflammation. Vascular/Lymphatic: Moderate aortic atherosclerosis. No aortic aneurysm the portal vein is patent. Splenic vein is patent. Few scattered periportal lymph nodes likely reactive, not enlarged by size criteria. Reproductive: Heterogeneous prostate gland is mildly prominent spanning 4.6 cm. There is mild nodular mass effect on the bladder base. Other: Small fat containing umbilical hernia without bowel involvement. Small fat containing supraumbilical ventral abdominal wall hernia without bowel involvement. No inflammatory changes of either hernia. There is no ascites. No free air. Musculoskeletal: Avascular necrosis of the right femoral head without collapse. Degenerative disc disease at L5-S1 with Modic endplate changes. Degenerative disc disease at L4-L5 with vacuum phenomenon. IMPRESSION: 1. Multilobulated cystic process either within or adjacent to the pancreatic head measuring 3.7 x 2.4 cm. There is mild adjacent edema/peripancreatic fat stranding. Findings are suspicious for acute pancreatitis with pseudocyst. Cystic lesion is age indeterminate with no prior abdominal imaging available. Recommend correlation with pancreatic enzymes and history of prior pancreatitis. Recommend further evaluation with pancreatic protocol MRI when patient is able to tolerate breath hold technique to exclude cystic  pancreatic mass. 2. Small fat containing umbilical and supraumbilical ventral abdominal wall hernias without bowel involvement or inflammation. 3. Hepatic steatosis. 4. Mildly prominent prostate gland with mild nodular mass effect on the bladder base. 5. 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 Aortic Atherosclerosis (ICD10-I70.0). Electronically Signed   By: Narda Rutherford M.D.   On: 10/20/2019 16:15    Procedures Procedures (including critical care time)  Medications Ordered in ED Medications  morphine 2 MG/ML injection 2 mg (2 mg Intravenous Given 10/20/19 1420)  potassium chloride SA (KLOR-CON) CR tablet 40 mEq (40 mEq Oral Given 10/20/19 1425)  iohexol (OMNIPAQUE) 300 MG/ML solution 100 mL (100 mLs Intravenous Contrast Given 10/20/19 1548)  morphine 2 MG/ML injection 2 mg (2 mg Intramuscular Given 10/20/19 1727)    ED Course  I have reviewed the triage vital  signs and the nursing notes.  Pertinent labs & imaging results that were available during my care of the patient were reviewed by me and considered in my medical decision making (see chart for details).    MDM Rules/Calculators/A&P                         54 year old male resents with abdominal pain x2 days On presentation, he is alert, oriented, nontoxic-appearing, no acute distress.  Vitals overall reassuring.  Physical exam with periumbilical tenderness.  CBC without leukocytosis, hemoglobin of 10.5, improved from 8 3 months ago.  Lipase normal.  CMP with mild hypokalemia, received supplement in the ED and will send home with some as well.  Milld hypocalcemia of 8.5. No significant EKG changes. UA without evidence of UTI, he has some proteinurea of 30. CT of the abdomen with new multilobulated cystic process and a small fat-containing umbilical and supraumbilical ventral hernia without bowel involvement or inflammation.  Patient received 4 mg of morphine here in the ED.  He still has some pain, however there is  no acute indication for surgery or admission.  Given his history of alcoholism, will hold opioid prescription.  Patient encouraged to take Tylenol or ibuprofen for pain.  Will refer to gastroenterology for the cyst and general surgery for the hernia.  Ambulatory referral was also placed as I question if the patient will follow up.  Return precautions discussed.  He voiced understanding and is agreeable.  At this stage in the ED course, the patient is medically screened and stable for discharge.  Final Clinical Impression(s) / ED Diagnoses Final diagnoses:  Pancreatic cyst  Recurrent abdominal hernia without obstruction or gangrene, unspecified hernia type    Rx / DC Orders ED Discharge Orders         Ordered    Ambulatory referral to Gastroenterology        10/20/19 1656    Ambulatory referral to General Surgery        10/20/19 1656           Leone Brand 10/20/19 1748    Gerhard Munch, MD 10/23/19 1606    Mare Ferrari, PA-C 11/01/19 [id number]    Gerhard Munch, MD 11/03/19 [phone]

## 2019-10-22 ENCOUNTER — Encounter: Payer: Self-pay | Admitting: Physician Assistant

## 2019-10-28 ENCOUNTER — Other Ambulatory Visit: Payer: Self-pay

## 2019-10-28 ENCOUNTER — Ambulatory Visit: Payer: 59 | Admitting: Critical Care Medicine

## 2019-10-28 ENCOUNTER — Telehealth: Payer: Self-pay

## 2019-10-28 VITALS — BP 130/84 | HR 110 | Temp 98.7°F | Resp 18 | Ht 69.0 in | Wt 180.0 lb

## 2019-10-28 DIAGNOSIS — K8521 Alcohol induced acute pancreatitis with uninfected necrosis: Secondary | ICD-10-CM | POA: Diagnosis not present

## 2019-10-28 DIAGNOSIS — F1020 Alcohol dependence, uncomplicated: Secondary | ICD-10-CM

## 2019-10-28 DIAGNOSIS — K863 Pseudocyst of pancreas: Secondary | ICD-10-CM | POA: Diagnosis not present

## 2019-10-28 DIAGNOSIS — K859 Acute pancreatitis without necrosis or infection, unspecified: Secondary | ICD-10-CM | POA: Insufficient documentation

## 2019-10-28 MED ORDER — ONDANSETRON 8 MG PO TBDP
8.0000 mg | ORAL_TABLET | Freq: Three times a day (TID) | ORAL | 0 refills | Status: DC | PRN
Start: 2019-10-28 — End: 2019-10-28

## 2019-10-28 MED ORDER — OXYCODONE-ACETAMINOPHEN 10-325 MG PO TABS: 1.0000 | ORAL_TABLET | Freq: Four times a day (QID) | ORAL | 0 refills | Status: DC | PRN

## 2019-10-28 MED ORDER — OXYCODONE-ACETAMINOPHEN 10-325 MG PO TABS: 1.0000 | ORAL_TABLET | Freq: Four times a day (QID) | ORAL | 0 refills | Status: AC | PRN

## 2019-10-28 MED ORDER — ONDANSETRON 8 MG PO TBDP
8.0000 mg | ORAL_TABLET | Freq: Three times a day (TID) | ORAL | 0 refills | Status: DC | PRN
Start: 2019-10-28 — End: 2019-11-24

## 2019-10-28 NOTE — Progress Notes (Signed)
Subjective:    Patient ID: Samuel Blevins, male    DOB: 07-07-1965, 54 y.o.   MRN: [id number]  This is a 54 year old male who is seen in the mobile medicine clinic today October 28, 2019  Patient has longstanding history of alcohol use and actually I had been seeing him previously at the [facility] clinic.  He also has been seen at the [facility] by nurse practitioner at that site but can no longer go there as he now has [employer]   This patient is had a long standing history of alcohol use, essential hypertension, AVM of the small bowel, prior history of lobar pneumonia, gastritis and gastroduodenitis.  The patient was admitted actually August 13 to the behavioral health unit for alcohol intoxication and detox at the [facility].  Below is documentation from the discharge summary of that admission  [name] is a 54 y.o. male who has long history of alcohol use disorder severe and presents to the ED requesting detox. Patient states that he drinks approximately 2-3 fifths per day and drinking daily. Patient was intoxicated on his arrival with the BAL was 434 on his admission. Per notes patient pulled out mini bottle of gin and drank it while he was in triage. His BAL was 434 on his admission. Patient states that extensive history of chronic alcohol abuse and increased his drinking lately. Patient states to several detoxes in the past and his last detox was 10 years ago and since then drinking daily basis. Patient denies history of withdrawal seizure or DTs. He denies other illicit drug usage. His UDS was negative. Patient denies suicidal or homicidal ideation. He denies hallucinations or psychosis. Patient admitted to inpatient behavioral health unit for alcohol detox with Librium.  During the course of the hospitalization patient is irritable, verbally abusive to staff and has poor insight. He did not complete his detox and is demanding discharge today.  Educated on need to complete detox but patient remains adamant. Educated on risk of seizures and worsening withdrawal symptoms to no avail. Patient is denying any acute withdrawal symptoms at present but is demanding Rx for Librium. Educated patient that this would not be provided and he would have to stay inpatient to complete detox. Patient was angry and verbally aggressive with this provider. He was not forthcoming and was minimally responsive to questions. He is minimally invested in treatment and has poor insight. He denies any mood disorders at present and is denying any SI, HI or AVH. After multiple attempts at convincing patient to complete detox and patients continued insistence for discharge he was sent home. He was given outpatient referral to [facility].   At time of discharge, patient is linear, direct, and organized; denies SI, HI, and AVH and is not currently significantly impaired, psychotic, or manic on exam. Detailed risk assessment is complete based on clinical exam and individual risk factors and acute suicide risk is low and acute violence risk is low. Individualized risk factors include: Addition and individualized protective factors include: positive family connectedness, no access to firearms, future oriented and participating in treatment plan. At this time, protective factors outweigh risk factors. Safety plan is created jointly which involves the patient following up with [facility] for med management and outpatient services. At this time, patient is educated and verbalized understanding of mental health resources and other crisis services in the community. They are instructed to call 911 and present to the nearest emergency room  should they experience any SI/HI/AVH or detrimental worsening of their mental health condition.  Regarding psychotropic medication treatment, the above psychotropic medications were prescribed and adjustment were made as described below (Medication  changes during this hospitalization). Physician(s) and team provided education to the client on the risk and benefits of treatment options, instructions for follow-up, and importance of adherence to chosen treatment. Medication side effects, possible drug interactions between the prescription meds and their interaction with recreational substances was discussed with the patient. No intolerant adverse side effects, were reported or noted, on day of discharge. Writer counseled the patient on ill effect of using recreational drugs and underscored the need for sobriety.  On , following sustained improvement in the affect of this patient, continued report of euthymic mood, repeated denial of suicidal, homicidal, and other violent ideation, adequate interaction with peers, active participation in groups while on the unit, and denial of adverse reactions from medications, the treatment team decided [name] was stable for discharge home with scheduled mental health treatment as noted below.   The patient was given Librium and a tapering fashion and he apparently is now off this medication.  After discharge the patient noted increased back pain and abdominal pain and presented to the [facility], ER August 25.  Below is documentation from that visit  54 year old male with a history of hypertension, alcohol abuse, AVM of the small bowel, presents to the ER with 2 days of abdominal pain.  Patient reports a history of an abdominal hernia, which he thinks "busted".  He stated he felt a sudden onset of pain around his pelvic is approximately 2 days ago.  Has reported some episodes of nonbloody nonbilious vomiting.  Denies any difficulty with bowel movements or urination, denies any blood in his stool.  He also notes that he feels like his abdomen is swollen.  Denies any fevers, endorses some chills at night.  States the pain prevents him from sleeping well.  He has not taken anything for his pain.                           54 year old male resents with abdominal pain x2 days On presentation, he is alert, oriented, nontoxic-appearing, no acute distress.  Vitals overall reassuring.  Physical exam with periumbilical tenderness.  CBC without leukocytosis, hemoglobin of 10.5, improved from 8 3 months ago.  Lipase normal.  CMP with mild hypokalemia, received supplement in the ED and will send home with some as well.  Milld hypocalcemia of 8.5. No significant EKG changes. UA without evidence of UTI, he has some proteinurea of 30. CT of the abdomen with new multilobulated cystic process and a small fat-containing umbilical and supraumbilical ventral hernia without bowel involvement or inflammation.  Patient received 4 mg of morphine here in the ED.  He still has some pain, however there is no acute indication for surgery or admission.  Given his history of alcoholism, will hold opioid prescription.  Patient encouraged to take Tylenol or ibuprofen for pain.  Will refer to gastroenterology for the cyst and general surgery for the hernia.  Ambulatory referral was also placed as I question if the patient will follow up.  Return precautions discussed.  He voiced understanding and is agreeable.  At this stage in the ED course, the patient is medically screened and stable for discharge.  CT scan showed necrosis the pancreas with acute pancreatitis changes and pancreatic cyst formation all likely due to alcohol  use previously  The patient was referred to general surgery and gastroenterology for follow-up but the earliest appointments were early October  There is an umbilical hernia with supraumbilical ventral hernia without bowel involvement this is the reason for the surgery referral  Gastroenterology was to see the patient for the pancreatic cyst and pancreatitis  The patient was given Tylenol and ibuprofen for pain however this is not relieved the pain and is progressed to the point where he comes to the mobile unit  requesting additional attention  The patient has had nausea with this and some vomiting.  He has been off alcohol now since he was hospitalized for the detoxification.  He has sharp pain in his lower back as well in both flanks.  He has left hip pain as well.  He denies any fever.  Past Medical History:  Diagnosis Date  . Alcohol withdrawal syndrome with complication (HCC)   . Elevated AST (SGOT)   . Hypertension   . Symptomatic anemia 11/23/2015     Family History  Problem Relation Age of Onset  . Diabetes Mellitus II Neg Hx      Social History   Socioeconomic History  . Marital status: Single    Spouse name: Not on file  . Number of children: Not on file  . Years of education: Not on file  . Highest education level: Not on file  Occupational History  . Not on file  Tobacco Use  . Smoking status: Current Every Day Smoker    Packs/day: 1.00    Years: 30.00    Pack years: 30.00  . Smokeless tobacco: Never Used  Vaping Use  . Vaping Use: Never used  Substance and Sexual Activity  . Alcohol use: Yes    Alcohol/week: 24.0 standard drinks    Types: 24 Cans of beer per week    Comment: 6 cans of beer per day after getting off work  . Drug use: Never  . Sexual activity: Not Currently  Other Topics Concern  . Not on file  Social History Narrative  . Not on file   Social Determinants of Health   Financial Resource Strain:   . Difficulty of Paying Living Expenses: Not on file  Food Insecurity:   . Worried About Programme researcher, broadcasting/film/video in the Last Year: Not on file  . Ran Out of Food in the Last Year: Not on file  Transportation Needs:   . Lack of Transportation (Medical): Not on file  . Lack of Transportation (Non-Medical): Not on file  Physical Activity:   . Days of Exercise per Week: Not on file  . Minutes of Exercise per Session: Not on file  Stress:   . Feeling of Stress : Not on file  Social Connections:   . Frequency of Communication with Friends and Family: Not  on file  . Frequency of Social Gatherings with Friends and Family: Not on file  . Attends Religious Services: Not on file  . Active Member of Clubs or Organizations: Not on file  . Attends Banker Meetings: Not on file  . Marital Status: Not on file  Intimate Partner Violence:   . Fear of Current or Ex-Partner: Not on file  . Emotionally Abused: Not on file  . Physically Abused: Not on file  . Sexually Abused: Not on file     No Known Allergies   Outpatient Medications Prior to Visit  Medication Sig Dispense Refill  . diclofenac Sodium (VOLTAREN) 1 % GEL  Apply 2-4 g topically 4 (four) times daily.    . ferrous sulfate 325 (65 FE) MG tablet Take 1 tablet (325 mg total) by mouth daily. 30 tablet 0  . folic acid (FOLVITE) 1 MG tablet Take 1 tablet (1 mg total) by mouth daily. 30 tablet 0  . Multiple Vitamin (MULTIVITAMIN WITH MINERALS) TABS tablet Take 1 tablet by mouth daily. 30 tablet 0  . omeprazole (PRILOSEC) 20 MG capsule Take 1 capsule (20 mg total) by mouth 2 (two) times daily before a meal. 60 capsule 0  . thiamine 100 MG tablet Take 1 tablet (100 mg total) by mouth daily. 30 tablet 0  . ibuprofen (ADVIL) 200 MG tablet Take 400 mg by mouth every 6 (six) hours as needed for moderate pain. (Patient not taking: Reported on 10/28/2019)    . naltrexone (DEPADE) 50 MG tablet Take 1 tablet (50 mg total) by mouth daily. (Patient not taking: Reported on 10/28/2019) 30 tablet 0   No facility-administered medications prior to visit.      Review of Systems  Constitutional: Positive for appetite change and fatigue.  HENT: Negative.   Eyes: Negative.   Respiratory: Negative.   Cardiovascular: Negative.   Gastrointestinal: Positive for abdominal distention, abdominal pain, nausea and vomiting. Negative for anal bleeding, blood in stool, constipation, diarrhea and rectal pain.  Endocrine: Negative.   Genitourinary: Negative.   Musculoskeletal: Positive for back pain.  Skin:  Negative.   Neurological: Negative.   Hematological: Negative.   Psychiatric/Behavioral: Positive for behavioral problems, dysphoric mood and sleep disturbance. Negative for self-injury and suicidal ideas.       Objective:   Physical Exam Vitals:   10/28/19 1510  BP: 130/84  Pulse: (!) 110  Resp: 18  Temp: 98.7 F (37.1 C)  TempSrc: Oral  SpO2: 96%  Weight: 180 lb (81.6 kg)  Height: 5\' 9"  (1.753 m)    Gen: Ill-appearing uncomfortable , in no distress,  normal affect  ENT: No lesions,  mouth clear,  oropharynx clear, no postnasal drip  Neck: No JVD, no TMG, no carotid bruits  Lungs: No use of accessory muscles, no dullness to percussion, clear without rales or rhonchi  Cardiovascular: RRR, heart sounds normal, no murmur or gallops, no peripheral edema  Abdomen: Bowel sounds active, severe tenderness in the epigastric area and in the mid abdomen and also tenderness in the left and right mid quadrant areas tender as well in the left and right flanks posteriorly, there is guarding but no rebound  Musculoskeletal: No deformities, no cyanosis or clubbing  Neuro: alert, non focal  Skin: Warm, no lesions or rashes  CT Abdomen 10/20/19:    IMPRESSION: 1. Multilobulated cystic process either within or adjacent to the pancreatic head measuring 3.7 x 2.4 cm. There is mild adjacent edema/peripancreatic fat stranding. Findings are suspicious for acute pancreatitis with pseudocyst. Cystic lesion is age indeterminate with no prior abdominal imaging available. Recommend correlation with pancreatic enzymes and history of prior pancreatitis. Recommend further evaluation with pancreatic protocol MRI when patient is able to tolerate breath hold technique to exclude cystic pancreatic mass. 2. Small fat containing umbilical and supraumbilical ventral abdominal wall hernias without bowel involvement or inflammation. 3. Hepatic steatosis. 4. Mildly prominent prostate gland with mild  nodular mass effect on the bladder base. 5. 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  Aortic Atherosclerosis (ICD10-I70.0).     Assessment & Plan:  I personally reviewed all images and lab data in the [facility] system  as well as any outside material available during this office visit and agree with the  radiology impressions.   Pancreatic pseudocyst 2 cyst formed around the pancreas likely related to neck or cessation of the pancreas with pseudocyst formation  These areas may become secondarily infected and are likely the source of the patient's significant pain syndrome  We will attempt to get this patient into gastroenterology sooner than October 4 however if were not able to do that this patient may need to return to the emergency room for inpatient treatment and evaluations  In the interim I have given this patient oxycodone 10 mg/acetaminophen 325 mg to take every 6 hours as needed for pain and also Zofran 8 mg every 8 hours as needed for nausea  I checked the [facility] drug database and only the Librium is shown in the system is recently prescribed  I will follow this patient up at the homeless shelter in a week however I am fully well expecting he will need to return to the emergency room given his current state.  I am trying to avoid the emergency room if I can given the high volume secondary to Covid surge  Pancreatitis, acute Acute pancreatitis alcohol induced as per pseudocyst assessment  Chronic alcoholism (HCC) Patient is status post alcohol detox and is now tapered off the Librium  The patient is currently not drinking alcohol at this time but is high risk for relapse     Darrian was seen today for abdominal pain.  Diagnoses and all orders for this visit:  Pancreatic pseudocyst  Alcohol-induced acute pancreatitis with uninfected necrosis  Chronic alcoholism (HCC)  Other orders -    -     ondansetron (ZOFRAN ODT) 8 MG disintegrating  tablet; Take 1 tablet (8 mg total) by mouth every 8 (eight) hours as needed for nausea or vomiting. -     oxyCODONE-acetaminophen (PERCOCET) 10-325 MG tablet; Take 1 tablet by mouth every 6 (six) hours as needed for up to 5 days for pain.

## 2019-10-28 NOTE — Assessment & Plan Note (Signed)
Acute pancreatitis alcohol induced as per pseudocyst assessment

## 2019-10-28 NOTE — Patient Instructions (Signed)
We are getting you sooner appointments to stomach doctor and the surgeon  Start percocet one every 6 hours as needed for pain  Start zofran as needed 4 times a day for nausea   If you are not any better in next 24 hours with your pain, then go back to the emergency room

## 2019-10-28 NOTE — Assessment & Plan Note (Addendum)
2 cyst formed around the pancreas likely related to neck or cessation of the pancreas with pseudocyst formation  These areas may become secondarily infected and are likely the source of the patient's significant pain syndrome  We will attempt to get this patient into gastroenterology sooner than October 4 however if were not able to do that this patient may need to return to the emergency room for inpatient treatment and evaluations  In the interim I have given this patient oxycodone 10 mg/acetaminophen 325 mg to take every 6 hours as needed for pain and also Zofran 8 mg every 8 hours as needed for nausea  I checked the [facility] drug database and only the Librium is shown in the system is recently prescribed  I will follow this patient up at the homeless shelter in a week however I am fully well expecting he will need to return to the emergency room given his current state.  I am trying to avoid the emergency room if I can given the high volume secondary to Covid surge

## 2019-10-28 NOTE — Assessment & Plan Note (Signed)
Patient is status post alcohol detox and is now tapered off the Librium  The patient is currently not drinking alcohol at this time but is high risk for relapse

## 2019-10-28 NOTE — Progress Notes (Signed)
Patient complains of back and hip pain Patient has eaten today.

## 2019-10-28 NOTE — Telephone Encounter (Signed)
Both phone encounters are for speciality offices. Calling on behalf of the provider; inquiring about an earlier appointment with the speciality office for the patient.

## 2019-11-03 ENCOUNTER — Telehealth: Payer: Self-pay

## 2019-11-03 ENCOUNTER — Telehealth: Payer: Self-pay | Admitting: *Deleted

## 2019-11-03 NOTE — Telephone Encounter (Signed)
Staff LVM for patient on a updated specialist appointment.

## 2019-11-03 NOTE — Telephone Encounter (Signed)
MA is speaking with staff at [facility] in attempts to schedule patient for a STAT appointment.  Patient is scheduled with [facility] for 10/4 and is on the wait list for a sooner appointment.

## 2019-11-04 ENCOUNTER — Telehealth: Payer: Self-pay

## 2019-11-04 NOTE — Telephone Encounter (Signed)
Contacted the MMU several times on today for verification of upcoming appointment with specialty clinic. Patient verifies awareness of the information shared on appt details from phone encounter on 9/8 from [facility].

## 2019-11-10 ENCOUNTER — Other Ambulatory Visit: Payer: Self-pay | Admitting: Critical Care Medicine

## 2019-11-10 ENCOUNTER — Encounter: Payer: Self-pay | Admitting: Critical Care Medicine

## 2019-11-10 MED ORDER — OXYCODONE-ACETAMINOPHEN 10-325 MG PO TABS: 1.0000 | ORAL_TABLET | Freq: Three times a day (TID) | ORAL | 0 refills | Status: AC | PRN

## 2019-11-10 NOTE — Progress Notes (Signed)
Med refill

## 2019-11-10 NOTE — Progress Notes (Signed)
Patient ID: Samuel Blevins, male   DOB: Feb 02, 1966, 54 y.o.   MRN: [id number] This patient is seen today in the [facility] clinic had seen him in the mobile medicine unit last month.  The patient continues to have vomiting if he eats late.  He is having abdominal pain is run out of his oxycodone.  He has bilateral pancreatic cysts and has pending appointments with surgery and gastroenterology.  He is requiring refills on his pain medication at this time.  He will go to pick up the medicine himself and request this go to [place].  I did refill the oxycodone acetaminophen

## 2019-11-19 NOTE — Telephone Encounter (Signed)
Care Coordinator spoke with the patient on the date and time of appointment. Patient was able to recall information shared from [facility] staff and provided residence staff with the information as well.

## 2019-11-24 ENCOUNTER — Other Ambulatory Visit: Payer: Self-pay | Admitting: Critical Care Medicine

## 2019-11-24 ENCOUNTER — Telehealth: Payer: Self-pay | Admitting: *Deleted

## 2019-11-24 ENCOUNTER — Encounter: Payer: Self-pay | Admitting: Critical Care Medicine

## 2019-11-24 MED ORDER — ONDANSETRON 8 MG PO TBDP: 8.0000 mg | ORAL_TABLET | Freq: Three times a day (TID) | ORAL | 0 refills | Status: DC | PRN

## 2019-11-24 MED ORDER — OXYCODONE HCL 5 MG PO TABS: 5.0000 mg | ORAL_TABLET | ORAL | 0 refills | Status: DC | PRN

## 2019-11-24 NOTE — Telephone Encounter (Signed)
[facility] [address] ([address]), [place] - [address]  [address] [place] [address]  Phone: [phone] Fax: [phone]  Hours: Not open 24 hours    called in needing a diagnosis for pain medication oxyCODONE (ROXICODONE) 5 MG immediate release tablet [[id number]]    Please advise
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
## 2019-11-25 NOTE — Telephone Encounter (Signed)
Medication was refilled on 11/23/17

## 2019-11-29 ENCOUNTER — Other Ambulatory Visit (INDEPENDENT_AMBULATORY_CARE_PROVIDER_SITE_OTHER): Payer: 59

## 2019-11-29 ENCOUNTER — Ambulatory Visit (INDEPENDENT_AMBULATORY_CARE_PROVIDER_SITE_OTHER): Payer: 59 | Admitting: Physician Assistant

## 2019-11-29 ENCOUNTER — Encounter: Payer: Self-pay | Admitting: Physician Assistant

## 2019-11-29 VITALS — BP 140/88 | HR 91 | Ht 70.5 in | Wt 175.0 lb

## 2019-11-29 DIAGNOSIS — K862 Cyst of pancreas: Secondary | ICD-10-CM | POA: Diagnosis not present

## 2019-11-29 DIAGNOSIS — R109 Unspecified abdominal pain: Secondary | ICD-10-CM

## 2019-11-29 DIAGNOSIS — R9389 Abnormal findings on diagnostic imaging of other specified body structures: Secondary | ICD-10-CM | POA: Diagnosis not present

## 2019-11-29 DIAGNOSIS — R1110 Vomiting, unspecified: Secondary | ICD-10-CM | POA: Diagnosis not present

## 2019-11-29 LAB — COMPREHENSIVE METABOLIC PANEL
ALT: 48 U/L (ref 0–53)
AST: 152 U/L — ABNORMAL HIGH (ref 0–37)
Albumin: 3.9 g/dL (ref 3.5–5.2)
Alkaline Phosphatase: 125 U/L — ABNORMAL HIGH (ref 39–117)
BUN: 7 mg/dL (ref 6–23)
CO2: 27 mEq/L (ref 19–32)
Calcium: 9.1 mg/dL (ref 8.4–10.5)
Chloride: 99 mEq/L (ref 96–112)
Creatinine, Ser: 0.68 mg/dL (ref 0.40–1.50)
GFR: 146.22 mL/min (ref >60.00)
Glucose, Bld: 72 mg/dL (ref 70–99)
Potassium: 3.3 mEq/L — ABNORMAL LOW (ref 3.5–5.1)
Sodium: 136 mEq/L (ref 135–145)
Total Bilirubin: 0.4 mg/dL (ref 0.2–1.2)
Total Protein: 8.4 g/dL — ABNORMAL HIGH (ref 6.0–8.3)

## 2019-11-29 LAB — CBC WITH DIFFERENTIAL/PLATELET
Basophils Absolute: 0.1 10*3/uL (ref 0.0–0.1)
Basophils Relative: 1.7 % (ref 0.0–3.0)
Eosinophils Absolute: 0 10*3/uL (ref 0.0–0.7)
Eosinophils Relative: 0.1 % (ref 0.0–5.0)
HCT: 34.9 % — ABNORMAL LOW (ref 39.0–52.0)
Hemoglobin: 11.3 g/dL — ABNORMAL LOW (ref 13.0–17.0)
Lymphocytes Relative: 34.4 % (ref 12.0–46.0)
Lymphs Abs: 1.2 10*3/uL (ref 0.7–4.0)
MCHC: 32.4 g/dL (ref 30.0–36.0)
MCV: 87.6 fl (ref 78.0–100.0)
Monocytes Absolute: 0.6 10*3/uL (ref 0.1–1.0)
Monocytes Relative: 15.7 % — ABNORMAL HIGH (ref 3.0–12.0)
Neutro Abs: 1.7 10*3/uL (ref 1.4–7.7)
Neutrophils Relative %: 48.1 % (ref 43.0–77.0)
Platelets: 149 10*3/uL — ABNORMAL LOW (ref 150.0–400.0)
RBC: 3.98 Mil/uL — ABNORMAL LOW (ref 4.22–5.81)
RDW: 21.8 % — ABNORMAL HIGH (ref 11.5–15.5)
WBC: 3.5 10*3/uL — ABNORMAL LOW (ref 4.0–10.5)

## 2019-11-29 LAB — LIPASE: Lipase: 99 U/L — ABNORMAL HIGH (ref 11.0–59.0)

## 2019-11-29 MED ORDER — OXYCODONE HCL 5 MG PO TABS: 5.0000 mg | ORAL_TABLET | ORAL | 0 refills | Status: AC | PRN

## 2019-11-29 MED ORDER — OMEPRAZOLE 40 MG PO CPDR
40.0000 mg | DELAYED_RELEASE_CAPSULE | Freq: Every morning | ORAL | 6 refills | Status: DC
Start: 2019-11-29 — End: 2019-12-15

## 2019-11-29 NOTE — Patient Instructions (Addendum)
If you are age 55 or older, your body mass index should be between 23-30. Your Body mass index is 24.75 kg/m. If this is out of the aforementioned range listed, please consider follow up with your Primary Care Provider.  If you are age 50 or younger, your body mass index should be between 19-25. Your Body mass index is 24.75 kg/m. If this is out of the aformentioned range listed, please consider follow up with your Primary Care Provider.   You have been scheduled for an MRI at [facility], located at [address] in the [facility]. Your appointment is scheduled on 12/03/19 at 8:00 pm. Please arrive 30 minutes prior to your appointment time for registration purposes. Please make certain not to have anything to eat or drink 4 hours prior to your test. In addition, if you have any metal in your body, have a pacemaker or defibrillator, please be sure to let your ordering physician know. This test typically takes 45 minutes to 1 hour to complete. Should you need to reschedule, please call [phone].  Your provider has requested that you go to the basement level for lab work before leaving today. Press "B" on the elevator. The lab is located at the first door on the left as you exit the elevator.  Continue using Zofran 4 mg every 6 hours as needed.  INCREASE Omeprazole to 40 mg every morning.   Continue Oxycodone  Follow up pending at this time.

## 2019-11-29 NOTE — Progress Notes (Signed)
Subjective:    Patient ID: Samuel Blevins, male    DOB: [birth year], 54 y.o.   MRN: [id number]  HPI Samuel Blevins is a 54 year old African-American male, established with Dr. Bryan Lemma after hospitalization in April 2021 with work-up for iron deficiency, anemia, and complaints of melena.  Patient has history of chronic EtOH abuse.  He is currently homeless and staying at a shelter here in [place]. 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  Esophagus normal with no evidence of varices.  Biopsies returned positive for H. Pylori. .  Patient was to follow-up with the  [facility]  internal medicine service, and notes indicated they would treat for H. pylori at that time.  I do not see where he followed up.  He has been seen by Dr. Lyda Jester via the mobile medicine clinic. He had an ER visit on 10/20/2019 with complaints of 2-day history of significant abdominal pain with nausea.  CT of the abdomen and pelvis was done which showed hepatic steatosis, and a multilobulated cystic process within or adjacent to the pancreatic head measuring 3.7 x 2.4 cm there was mild edema and peripancreatic stranding.  Finding suspicious for pancreatitis with pseudocyst versus cystic pancreatic mass.  An MRI was recommended. Interesting lipase was normal at that time, chemistries unremarkable exception of T bili of 1.4, hemoglobin 10.5 hematocrit of 33 both stable. Dr. Joya Gaskins has given the patient oxycodone, also has prescription for Zofran. He says he continues to have ongoing abdominal pain and has been having some difficulty eating.  He says if he eats anything very heavy he will have nausea and vomiting.  He reports eating a lot of "oodle of noodes" and that seems to settle okay.  No documented fever.  Denies any diarrhea.  He has noted dark stools intermittently. He says he is tired of hurting and wants something done.  He is currently out of analgesics.  He says  he has not been drinking alcohol on a regular basis.   Review of Systems Pertinent positive and negative review of systems were noted in the above HPI section.  All other review of systems was otherwise negative.  Outpatient Encounter Medications as of 11/29/2019  Medication Sig  . ondansetron (ZOFRAN ODT) 8 MG disintegrating tablet Take 1 tablet (8 mg total) by mouth every 8 (eight) hours as needed for nausea or vomiting.  Marland Kitchen oxyCODONE (ROXICODONE) 5 MG immediate release tablet Take 1 tablet (5 mg total) by mouth every 4 (four) hours as needed for severe pain. (Patient taking differently: Take 5 mg by mouth every 4 (four) hours as needed for severe pain. Take one every 6 hours as needed for abdominal pain)  . omeprazole (PRILOSEC) 40 MG capsule Take 1 capsule (40 mg total) by mouth in the morning.  Marland Kitchen oxyCODONE (ROXICODONE) 5 MG immediate release tablet Take 1 tablet (5 mg total) by mouth every 4 (four) hours as needed for up to 7 days for severe pain.  . [DISCONTINUED] diclofenac Sodium (VOLTAREN) 1 % GEL Apply 2-4 g topically 4 (four) times daily.  . [DISCONTINUED] ferrous sulfate 325 (65 FE) MG tablet Take 1 tablet (325 mg total) by mouth daily.  . [DISCONTINUED] folic acid (FOLVITE) 1 MG tablet Take 1 tablet (1 mg total) by mouth daily.  . [DISCONTINUED] ibuprofen (ADVIL) 200 MG tablet Take 400 mg by mouth every 6 (six) hours as needed for moderate pain. (Patient not taking: Reported on 10/28/2019)  . [  DISCONTINUED] Multiple Vitamin (MULTIVITAMIN WITH MINERALS) TABS tablet Take 1 tablet by mouth daily.  . [DISCONTINUED] naltrexone (DEPADE) 50 MG tablet Take 1 tablet (50 mg total) by mouth daily. (Patient not taking: Reported on 10/28/2019)  . [DISCONTINUED] omeprazole (PRILOSEC) 20 MG capsule Take 1 capsule (20 mg total) by mouth 2 (two) times daily before a meal.  . [DISCONTINUED] ondansetron (ZOFRAN ODT) 8 MG disintegrating tablet Take 1 tablet (8 mg total) by mouth every 8 (eight) hours as needed  for nausea or vomiting.  . [DISCONTINUED] oxyCODONE-acetaminophen (PERCOCET) 10-325 MG tablet Take 1 tablet by mouth every 6 (six) hours as needed for up to 5 days for pain.  . [DISCONTINUED] thiamine 100 MG tablet Take 1 tablet (100 mg total) by mouth daily.   No facility-administered encounter medications on file as of 11/29/2019.   No Known Allergies Patient Active Problem List   Diagnosis Date Noted  . Pancreatic pseudocyst 10/28/2019  . Pancreatitis, acute 10/28/2019  . Gastritis and gastroduodenitis   . AVM (arteriovenous malformation) of small bowel, acquired   . Chronic alcoholism ([place]) 06/21/2019  . Tobacco abuse 03/17/2014  . Essential hypertension 03/17/2014   Social History   Socioeconomic History  . Marital status: Single    Spouse name: Not on file  . Number of children: Not on file  . Years of education: Not on file  . Highest education level: Not on file  Occupational History  . Not on file  Tobacco Use  . Smoking status: Current Every Day Smoker    Packs/day: 1.00    Years: 30.00    Pack years: 30.00  . Smokeless tobacco: Never Used  Vaping Use  . Vaping Use: Never used  Substance and Sexual Activity  . Alcohol use: Yes    Alcohol/week: 24.0 standard drinks    Types: 24 Cans of beer per week    Comment: 6 cans of beer per day after getting off work  . Drug use: Never  . Sexual activity: Not Currently  Other Topics Concern  . Not on file  Social History Narrative  . Not on file   Social Determinants of Health   Financial Resource Strain:   . Difficulty of Paying Living Expenses: Not on file  Food Insecurity:   . Worried About Charity fundraiser in the Last Year: Not on file  . Ran Out of Food in the Last Year: Not on file  Transportation Needs:   . Lack of Transportation (Medical): Not on file  . Lack of Transportation (Non-Medical): Not on file  Physical Activity:   . Days of Exercise per Week: Not on file  . Minutes of Exercise per Session:  Not on file  Stress:   . Feeling of Stress : Not on file  Social Connections:   . Frequency of Communication with Friends and Family: Not on file  . Frequency of Social Gatherings with Friends and Family: Not on file  . Attends Religious Services: Not on file  . Active Member of Clubs or Organizations: Not on file  . Attends Archivist Meetings: Not on file  . Marital Status: Not on file  Intimate Partner Violence:   . Fear of Current or Ex-Partner: Not on file  . Emotionally Abused: Not on file  . Physically Abused: Not on file  . Sexually Abused: Not on file    Mr. Mantz family history is not on file.      Objective:    Vitals:  11/29/19 1116  BP: 140/88  Pulse: 91    Physical Exam Well-developed older African-American male in no acute distress.  Height, Weight, 175 BMI 24.7  HEENT; nontraumatic normocephalic, EOMI, PER R LA, sclera anicteric. Oropharynx; not examined Neck; supple, no JVD Cardiovascular; regular rate and rhythm with S1-S2, no murmur rub or gallop Pulmonary; Clear bilaterally Abdomen; soft, tender across the upper mid abdomen nondistended, no palpable mass or hepatosplenomegaly, bowel sounds are active.  No guarding or rebound, small ventral hernia Rectal; not done today Skin; benign exam, no jaundice rash or appreciable lesions Extremities; no clubbing cyanosis or edema skin warm and dry Neuro/Psych; alert and oriented x4, grossly nonfocal mood and affect appropriate.  Little eye contact       Assessment & Plan:   #46 54 year old African-American male, homeless with history of EtOH abuse with abnormal CT of the abdomen pelvis on 10/20/2019 done for complaints of abdominal pain.  CT showing a multiloculated cystic process within or adjacent to the pancreatic head measuring 3.7 x 2.4 cm.  This was suspicious for pancreatitis with developing pseudocyst versus cystic neoplasm.  Lipase and LFTs normal at the time of ER visit.  Patient  continues to complain of upper abdominal pain.  We will need to rule out pancreatic neoplasm, rule out persistent pancreatic pseudocyst  #2 history of duodenal AVMs status post EGD with [facility] April 2021.  Also with finding of gastritis and duodenitis at that time. #3 anemia/iron deficiency #4  + H. Pylori-not treated as yet  Plan; CBC, lipase, c-Met Patient will be scheduled for MRI of the abdomen, pancreatic protocol. Continue Zofran 4 mg every 6 hours as needed for nausea continue light frequent low-fat meals.   Continue EtOH avoidance/abstinence Start omeprazole 40 mg p.o. every morning-prescription refill sent Will refill oxycodone 5 mg 1 p.o. every 6 hours as needed severe pain #30 no refills  Will await results of the MRI I did not initiate antibiotics today, will eventually need to be treated for the H. Pylori Ideally he should also have colonoscopy - which will be a challenge given homeless status.  Pama Roskos S Verta Riedlinger PA-C 11/29/2019   Cc: Garald Balding, PA-C

## 2019-11-30 ENCOUNTER — Other Ambulatory Visit: Payer: Self-pay

## 2019-11-30 MED ORDER — POTASSIUM CHLORIDE CRYS ER 10 MEQ PO TBCR: 20.0000 meq | EXTENDED_RELEASE_TABLET | Freq: Two times a day (BID) | ORAL | 0 refills | Status: DC

## 2019-12-02 NOTE — Progress Notes (Signed)
Agree with the assessment and plan as outlined by Amy Esterwood, PA-C.  Kesha Hurrell, DO, FACG  

## 2019-12-03 ENCOUNTER — Ambulatory Visit (HOSPITAL_COMMUNITY): Payer: 59

## 2019-12-08 ENCOUNTER — Other Ambulatory Visit: Payer: Self-pay

## 2019-12-08 ENCOUNTER — Ambulatory Visit (HOSPITAL_COMMUNITY)
Admission: RE | Admit: 2019-12-08 | Discharge: 2019-12-08 | Disposition: A | Discharge status: Home / Self Care | Payer: 59 | Source: Ambulatory Visit | Attending: Physician Assistant | Admitting: Physician Assistant

## 2019-12-08 DIAGNOSIS — K862 Cyst of pancreas: Secondary | ICD-10-CM | POA: Diagnosis present

## 2019-12-08 DIAGNOSIS — R9389 Abnormal findings on diagnostic imaging of other specified body structures: Secondary | ICD-10-CM | POA: Diagnosis present

## 2019-12-08 DIAGNOSIS — R109 Unspecified abdominal pain: Secondary | ICD-10-CM | POA: Diagnosis not present

## 2019-12-08 DIAGNOSIS — R1110 Vomiting, unspecified: Secondary | ICD-10-CM | POA: Insufficient documentation

## 2019-12-08 MED ORDER — GADOBUTROL 1 MMOL/ML IV SOLN
8.0000 mL | Freq: Once | INTRAVENOUS | Status: AC | PRN
  Administered 2019-12-08: 8 mL via INTRAVENOUS

## 2019-12-15 ENCOUNTER — Encounter: Payer: Self-pay | Admitting: Critical Care Medicine

## 2019-12-15 ENCOUNTER — Other Ambulatory Visit: Payer: Self-pay | Admitting: Critical Care Medicine

## 2019-12-15 MED ORDER — CLARITHROMYCIN 500 MG PO TABS: 500.0000 mg | ORAL_TABLET | Freq: Two times a day (BID) | ORAL | 0 refills | Status: AC

## 2019-12-15 MED ORDER — OXYCODONE HCL 10 MG PO TABS
10.0000 mg | ORAL_TABLET | Freq: Four times a day (QID) | ORAL | 0 refills | Status: DC | PRN
Start: 2019-12-15 — End: 2019-12-24

## 2019-12-15 MED ORDER — METRONIDAZOLE 500 MG PO TABS: 500.0000 mg | ORAL_TABLET | Freq: Three times a day (TID) | ORAL | 0 refills | Status: AC

## 2019-12-15 MED ORDER — OMEPRAZOLE 40 MG PO CPDR
40.0000 mg | DELAYED_RELEASE_CAPSULE | Freq: Two times a day (BID) | ORAL | 6 refills | Status: DC
Start: 2019-12-15 — End: 2020-12-12

## 2019-12-15 NOTE — Progress Notes (Signed)
Patient ID: Samuel Blevins, male   DOB: 06-09-1965, 55 y.o.   MRN: [id number] This patient is seen today in the [facility] he continues to have abdominal pain nausea tarry stools  Patient was seen by gastroenterology and MRI was performed he has evidence of potential pancreatitis divisum and also duodenitis with duodenal ulcer  The pancreatic pseudocyst that were there previously were resolved.  He is no longer drinking alcohol.  He is H. pylori positive.  On exam abdomen is tender bowel sounds active remainder of exam unremarkable  Pression is that patient has severe duodenitis ongoing pancreatitis alcohol induced H. pylori positive  Plan to be to administer Biaxin 500 mg twice daily for 14 days Flagyl 500 mg 3 times a day for 14 days high-dose omeprazole 40 mg twice daily we will also represcribe opiate oxycodone as needed for pain and establish a health and wellness clinic appointment and follow patient up with gastroenterology

## 2019-12-15 NOTE — Progress Notes (Signed)
Meds for ulcer

## 2019-12-20 ENCOUNTER — Ambulatory Visit: Payer: 59 | Admitting: Critical Care Medicine

## 2019-12-20 ENCOUNTER — Telehealth: Payer: Self-pay | Admitting: Critical Care Medicine

## 2019-12-20 ENCOUNTER — Telehealth: Payer: Self-pay | Admitting: Physician Assistant

## 2019-12-20 DIAGNOSIS — K2970 Gastritis, unspecified, without bleeding: Secondary | ICD-10-CM

## 2019-12-20 DIAGNOSIS — Z1211 Encounter for screening for malignant neoplasm of colon: Secondary | ICD-10-CM

## 2019-12-20 DIAGNOSIS — A048 Other specified bacterial intestinal infections: Secondary | ICD-10-CM

## 2019-12-20 DIAGNOSIS — K921 Melena: Secondary | ICD-10-CM

## 2019-12-20 DIAGNOSIS — K863 Pseudocyst of pancreas: Secondary | ICD-10-CM

## 2019-12-20 NOTE — Telephone Encounter (Signed)
Patient would like to talk with the doctor regarding getting a referral for a GI doctor.  Please advise and call patient to discuss at [phone]

## 2019-12-20 NOTE — Telephone Encounter (Signed)
Vito - I sent you a message attached to this pt's MRI - please review my not and the MRI  I started Omeprazole 40 mg daily  He is being treated for hpylori  He is having ongoing pain , may be worse-  and now saying he cannot eat pt is homeless  He has had an Rx for oxycodone   Anything else we need to do ?- can offer ER / admit  though not sure that will solve the problem .  May also need to start pancreatic enzyme

## 2019-12-20 NOTE — Telephone Encounter (Signed)
Hi Samuel Blevins, we received an urgent referral from pt's PCP. I spoke with pt and he stated that his sxs have worsened. He was recently seen on 11/29/19. Pls call advise or call pt.

## 2019-12-20 NOTE — Telephone Encounter (Signed)
Patient called with c/o continuing abdominal pain. States he cannot eat. He tries but he will throw it up. He is having multiple bowel movements that are pasty to brown in color.He thinks he may vomit blood at times. PCP started treatment for H Pylori on 12/15/19. I confirmed with the pharmacy that all medications on the list were picked up by the patient. The patient is not certain he is taking his "stomach medicine."

## 2019-12-20 NOTE — Telephone Encounter (Signed)
The patient missed his appointment today as he did not have bus passes and did not feel like coming into the clinic he is requesting referral back to gastroenterology  I will contact gastroenterology to see if we can get him back in for return follow-up visit and I will see him at the [facility] on Wednesday afternoon

## 2019-12-20 NOTE — Progress Notes (Deleted)
Subjective:    Patient ID: Samuel Blevins, male    DOB: 07-07-1965, 54 y.o.   MRN: [id number]  This is a 54 year old male who is seen in the mobile medicine clinic today October 28, 2019  Patient has longstanding history of alcohol use and actually I had been seeing him previously at the [facility] clinic.  He also has been seen at the [facility] by nurse practitioner at that site but can no longer go there as he now has [employer]   This patient is had a long standing history of alcohol use, essential hypertension, AVM of the small bowel, prior history of lobar pneumonia, gastritis and gastroduodenitis.  The patient was admitted actually August 13 to the behavioral health unit for alcohol intoxication and detox at the [facility].  Below is documentation from the discharge summary of that admission  Samuel Blevins is a 54 y.o. male who has long history of alcohol use disorder severe and presents to the ED requesting detox. Patient states that he drinks approximately 2-3 fifths per day and drinking daily. Patient was intoxicated on his arrival with the BAL was 434 on his admission. Per notes patient pulled out mini bottle of gin and drank it while he was in triage. His BAL was 434 on his admission. Patient states that extensive history of chronic alcohol abuse and increased his drinking lately. Patient states to several detoxes in the past and his last detox was 10 years ago and since then drinking daily basis. Patient denies history of withdrawal seizure or DTs. He denies other illicit drug usage. His UDS was negative. Patient denies suicidal or homicidal ideation. He denies hallucinations or psychosis. Patient admitted to inpatient behavioral health unit for alcohol detox with Librium.  During the course of the hospitalization patient is irritable, verbally abusive to staff and has poor insight. He did not complete his detox and is demanding discharge today.  Educated on need to complete detox but patient remains adamant. Educated on risk of seizures and worsening withdrawal symptoms to no avail. Patient is denying any acute withdrawal symptoms at present but is demanding Rx for Librium. Educated patient that this would not be provided and he would have to stay inpatient to complete detox. Patient was angry and verbally aggressive with this provider. He was not forthcoming and was minimally responsive to questions. He is minimally invested in treatment and has poor insight. He denies any mood disorders at present and is denying any SI, HI or AVH. After multiple attempts at convincing patient to complete detox and patients continued insistence for discharge he was sent home. He was given outpatient referral to [facility].   At time of discharge, patient is linear, direct, and organized; denies SI, HI, and AVH and is not currently significantly impaired, psychotic, or manic on exam. Detailed risk assessment is complete based on clinical exam and individual risk factors and acute suicide risk is low and acute violence risk is low. Individualized risk factors include: Addition and individualized protective factors include: positive family connectedness, no access to firearms, future oriented and participating in treatment plan. At this time, protective factors outweigh risk factors. Safety plan is created jointly which involves the patient following up with [facility] for med management and outpatient services. At this time, patient is educated and verbalized understanding of mental health resources and other crisis services in the community. They are instructed to call 911 and present to the nearest emergency room  should they experience any SI/HI/AVH or detrimental worsening of their mental health condition.  Regarding psychotropic medication treatment, the above psychotropic medications were prescribed and adjustment were made as described below (Medication  changes during this hospitalization). Physician(s) and team provided education to the client on the risk and benefits of treatment options, instructions for follow-up, and importance of adherence to chosen treatment. Medication side effects, possible drug interactions between the prescription meds and their interaction with recreational substances was discussed with the patient. No intolerant adverse side effects, were reported or noted, on day of discharge. Writer counseled the patient on ill effect of using recreational drugs and underscored the need for sobriety.  On , following sustained improvement in the affect of this patient, continued report of euthymic mood, repeated denial of suicidal, homicidal, and other violent ideation, adequate interaction with peers, active participation in groups while on the unit, and denial of adverse reactions from medications, the treatment team decided Samuel Blevins was stable for discharge home with scheduled mental health treatment as noted below.   The patient was given Librium and a tapering fashion and he apparently is now off this medication.  After discharge the patient noted increased back pain and abdominal pain and presented to the [facility], ER August 25.  Below is documentation from that visit  54 year old male with a history of hypertension, alcohol abuse, AVM of the small bowel, presents to the ER with 2 days of abdominal pain.  Patient reports a history of an abdominal hernia, which he thinks "busted".  He stated he felt a sudden onset of pain around his pelvic is approximately 2 days ago.  Has reported some episodes of nonbloody nonbilious vomiting.  Denies any difficulty with bowel movements or urination, denies any blood in his stool.  He also notes that he feels like his abdomen is swollen.  Denies any fevers, endorses some chills at night.  States the pain prevents him from sleeping well.  He has not taken anything for his pain.                           54 year old male resents with abdominal pain x2 days On presentation, he is alert, oriented, nontoxic-appearing, no acute distress.  Vitals overall reassuring.  Physical exam with periumbilical tenderness.  CBC without leukocytosis, hemoglobin of 10.5, improved from 8 3 months ago.  Lipase normal.  CMP with mild hypokalemia, received supplement in the ED and will send home with some as well.  Milld hypocalcemia of 8.5. No significant EKG changes. UA without evidence of UTI, he has some proteinurea of 30. CT of the abdomen with new multilobulated cystic process and a small fat-containing umbilical and supraumbilical ventral hernia without bowel involvement or inflammation.  Patient received 4 mg of morphine here in the ED.  He still has some pain, however there is no acute indication for surgery or admission.  Given his history of alcoholism, will hold opioid prescription.  Patient encouraged to take Tylenol or ibuprofen for pain.  Will refer to gastroenterology for the cyst and general surgery for the hernia.  Ambulatory referral was also placed as I question if the patient will follow up.  Return precautions discussed.  He voiced understanding and is agreeable.  At this stage in the ED course, the patient is medically screened and stable for discharge.  CT scan showed necrosis the pancreas with acute pancreatitis changes and pancreatic cyst formation all likely due to alcohol  use previously  The patient was referred to general surgery and gastroenterology for follow-up but the earliest appointments were early October  There is an umbilical hernia with supraumbilical ventral hernia without bowel involvement this is the reason for the surgery referral  Gastroenterology was to see the patient for the pancreatic cyst and pancreatitis  The patient was given Tylenol and ibuprofen for pain however this is not relieved the pain and is progressed to the point where he comes to the mobile unit  requesting additional attention  The patient has had nausea with this and some vomiting.  He has been off alcohol now since he was hospitalized for the detoxification.  He has sharp pain in his lower back as well in both flanks.  He has left hip pain as well.  He denies any fever.  12/20/2019   Past Medical History:  Diagnosis Date  . Alcohol withdrawal syndrome with complication (HCC)   . Elevated AST (SGOT)   . Hypertension   . Symptomatic anemia 11/23/2015     Family History  Problem Relation Age of Onset  . Diabetes Mellitus II Neg Hx   . Colon cancer Neg Hx   . Stomach cancer Neg Hx   . Pancreatic cancer Neg Hx      Social History   Socioeconomic History  . Marital status: Single    Spouse name: Not on file  . Number of children: Not on file  . Years of education: Not on file  . Highest education level: Not on file  Occupational History  . Not on file  Tobacco Use  . Smoking status: Current Every Day Smoker    Packs/day: 1.00    Years: 30.00    Pack years: 30.00  . Smokeless tobacco: Never Used  Vaping Use  . Vaping Use: Never used  Substance and Sexual Activity  . Alcohol use: Yes    Alcohol/week: 24.0 standard drinks    Types: 24 Cans of beer per week    Comment: 6 cans of beer per day after getting off work  . Drug use: Never  . Sexual activity: Not Currently  Other Topics Concern  . Not on file  Social History Narrative  . Not on file   Social Determinants of Health   Financial Resource Strain:   . Difficulty of Paying Living Expenses: Not on file  Food Insecurity:   . Worried About Programme researcher, broadcasting/film/video in the Last Year: Not on file  . Ran Out of Food in the Last Year: Not on file  Transportation Needs:   . Lack of Transportation (Medical): Not on file  . Lack of Transportation (Non-Medical): Not on file  Physical Activity:   . Days of Exercise per Week: Not on file  . Minutes of Exercise per Session: Not on file  Stress:   . Feeling of Stress  : Not on file  Social Connections:   . Frequency of Communication with Friends and Family: Not on file  . Frequency of Social Gatherings with Friends and Family: Not on file  . Attends Religious Services: Not on file  . Active Member of Clubs or Organizations: Not on file  . Attends Banker Meetings: Not on file  . Marital Status: Not on file  Intimate Partner Violence:   . Fear of Current or Ex-Partner: Not on file  . Emotionally Abused: Not on file  . Physically Abused: Not on file  . Sexually Abused: Not on file  No Known Allergies   Outpatient Medications Prior to Visit  Medication Sig Dispense Refill  . clarithromycin (BIAXIN) 500 MG tablet Take 1 tablet (500 mg total) by mouth 2 (two) times daily for 14 days. 28 tablet 0  . metroNIDAZOLE (FLAGYL) 500 MG tablet Take 1 tablet (500 mg total) by mouth 3 (three) times daily for 14 days. 42 tablet 0  . omeprazole (PRILOSEC) 40 MG capsule Take 1 capsule (40 mg total) by mouth 2 (two) times daily before a meal. 60 capsule 6  . ondansetron (ZOFRAN ODT) 8 MG disintegrating tablet Take 1 tablet (8 mg total) by mouth every 8 (eight) hours as needed for nausea or vomiting. 30 tablet 0  . oxyCODONE 10 MG TABS Take 1 tablet (10 mg total) by mouth every 6 (six) hours as needed for moderate pain or severe pain. 40 tablet 0  . potassium chloride (KLOR-CON) 10 MEQ tablet Take 2 tablets (20 mEq total) by mouth 2 (two) times daily for 4 days. 16 tablet 0   No facility-administered medications prior to visit.      Review of Systems  Constitutional: Positive for appetite change and fatigue.  HENT: Negative.   Eyes: Negative.   Respiratory: Negative.   Cardiovascular: Negative.   Gastrointestinal: Positive for abdominal distention, abdominal pain, nausea and vomiting. Negative for anal bleeding, blood in stool, constipation, diarrhea and rectal pain.  Endocrine: Negative.   Genitourinary: Negative.   Musculoskeletal: Positive  for back pain.  Skin: Negative.   Neurological: Negative.   Hematological: Negative.   Psychiatric/Behavioral: Positive for behavioral problems, dysphoric mood and sleep disturbance. Negative for self-injury and suicidal ideas.       Objective:   Physical Exam There were no vitals filed for this visit.  Gen: Ill-appearing uncomfortable , in no distress,  normal affect  ENT: No lesions,  mouth clear,  oropharynx clear, no postnasal drip  Neck: No JVD, no TMG, no carotid bruits  Lungs: No use of accessory muscles, no dullness to percussion, clear without rales or rhonchi  Cardiovascular: RRR, heart sounds normal, no murmur or gallops, no peripheral edema  Abdomen: Bowel sounds active, severe tenderness in the epigastric area and in the mid abdomen and also tenderness in the left and right mid quadrant areas tender as well in the left and right flanks posteriorly, there is guarding but no rebound  Musculoskeletal: No deformities, no cyanosis or clubbing  Neuro: alert, non focal  Skin: Warm, no lesions or rashes  CT Abdomen 10/20/19:    IMPRESSION: 1. Multilobulated cystic process either within or adjacent to the pancreatic head measuring 3.7 x 2.4 cm. There is mild adjacent edema/peripancreatic fat stranding. Findings are suspicious for acute pancreatitis with pseudocyst. Cystic lesion is age indeterminate with no prior abdominal imaging available. Recommend correlation with pancreatic enzymes and history of prior pancreatitis. Recommend further evaluation with pancreatic protocol MRI when patient is able to tolerate breath hold technique to exclude cystic pancreatic mass. 2. Small fat containing umbilical and supraumbilical ventral abdominal wall hernias without bowel involvement or inflammation. 3. Hepatic steatosis. 4. Mildly prominent prostate gland with mild nodular mass effect on the bladder base. 5. 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  Aortic Atherosclerosis (ICD10-I70.0).     Assessment & Plan:  I personally reviewed all images and lab data in the [facility] system as well as any outside material available during this office visit and agree with the  radiology impressions.   No problem-specific Assessment &  Plan notes found for this encounter.   Molly MaduroRobert was seen today for abdominal pain.  Diagnoses and all orders for this visit:  Pancreatic pseudocyst  Alcohol-induced acute pancreatitis with uninfected necrosis  Chronic alcoholism (HCC)  Other orders -    -     ondansetron (ZOFRAN ODT) 8 MG disintegrating tablet; Take 1 tablet (8 mg total) by mouth every 8 (eight) hours as needed for nausea or vomiting. -     oxyCODONE-acetaminophen (PERCOCET) 10-325 MG tablet; Take 1 tablet by mouth every 6 (six) hours as needed for up to 5 days for pain.

## 2019-12-22 ENCOUNTER — Other Ambulatory Visit: Payer: Self-pay | Admitting: Critical Care Medicine

## 2019-12-22 ENCOUNTER — Encounter: Payer: Self-pay | Admitting: Critical Care Medicine

## 2019-12-22 MED ORDER — PANCRELIPASE (LIP-PROT-AMYL) 36000-114000 UNITS PO CPEP: 36000.0000 [IU] | ORAL_CAPSULE | Freq: Three times a day (TID) | ORAL | 1 refills | Status: DC

## 2019-12-22 NOTE — Telephone Encounter (Signed)
MRI previously reviewed and agree with plan to treat H pylori as currently doing. Trial of Creon is also reasonable- do we have any samples we can provide for a few weeks of treatment? Otherwise, agree that admission for expedited evaluation given his socioeconomic issues is very reasonable, particularly if increasing pain and not tolerating PO intake. I otherwise do not have an clinics scheduled at the [place] office on the horizon, only at the [facility] office.

## 2019-12-22 NOTE — Telephone Encounter (Signed)
Beth, please see Dr. Frankey Shown comments-if we can get a hold of him lets get him started on Creon 36,001 p.o. with each meal, can give him samples.  Also can offer ER if he is having worsening pain, and get him scheduled for follow-up as we discussed previously thank you

## 2019-12-22 NOTE — Telephone Encounter (Signed)
Dr. Jettie Pagan called to request to get pt in asap. He stated that he saw pt today and he is stable. However, pt may need EGD. He stated that he will take care of ordering Creon but offering some samples would be great. Pls call pt to offer appt.

## 2019-12-22 NOTE — Telephone Encounter (Signed)
Ok thank you- we can decide about EGD at Appt next week .

## 2019-12-22 NOTE — Progress Notes (Signed)
Creon Rx.

## 2019-12-22 NOTE — Telephone Encounter (Signed)
Dr. Shan Levans called to request to get pt in asap. He stated that he saw pt today and he is stable. However, pt may need EGD. He stated that he will take care of ordering Creon but offering some samples would be great. Pls call pt to offer appt.

## 2019-12-22 NOTE — Telephone Encounter (Signed)
Amy Spoke with Dr [name]. He has a prescription for Creon sent to the [facility]. Patient has insurance.  Spoke with the patient. He plans to pick up his Creon tomorrow. Confirms he is taking Omeprazole for the stomach acid. States he can eat Ramen noodles and maybe a little salad, but his stomach still hurts. States he is not drinking any alcohol. Follow up appointment scheduled for 12/31/19. He will ask for a bus pass to make the appointment.

## 2019-12-23 ENCOUNTER — Other Ambulatory Visit: Payer: Self-pay | Admitting: Critical Care Medicine

## 2019-12-23 ENCOUNTER — Telehealth (INDEPENDENT_AMBULATORY_CARE_PROVIDER_SITE_OTHER): Payer: Self-pay | Admitting: Critical Care Medicine

## 2019-12-23 ENCOUNTER — Telehealth: Payer: Self-pay | Admitting: Critical Care Medicine

## 2019-12-23 MED ORDER — PANCRELIPASE (LIP-PROT-AMYL) 36000-114000 UNITS PO CPEP
36000.0000 [IU] | ORAL_CAPSULE | Freq: Three times a day (TID) | ORAL | 1 refills | Status: DC
Start: 2019-12-23 — End: 2020-02-02

## 2019-12-23 MED FILL — CREON DR 36,000 UNITS CAP: 36000-11400 | 30 days supply | Qty: 90 | Fill #0

## 2019-12-23 NOTE — Telephone Encounter (Signed)
Medication Refill - Medication: oxyCODONE 10 MG TABS    Preferred Pharmacy (with phone number or street name): [id number] - [place] ([place]), [place] - [name] Phone:  [phone]  Fax:  [phone]       Agent: Please be advised that RX refills may take up to 3 business days. We ask that you follow-up with your pharmacy.

## 2019-12-23 NOTE — Telephone Encounter (Signed)
Copied from CRM #[id number]. Topic: General - Other >> Dec 23, 2019  4:08 PM Johnson, Shiquita C wrote: Reason for CRM: pt called in to make provider aware, pt says that he received Rx from pharmacy and really appreciate him. Pt also states that  GI has not called him for apt, pt would like further assistance. 

## 2019-12-23 NOTE — Telephone Encounter (Signed)
Let him know  I am sending to [facility] outpatient pharmacy under congregational nurse program      Pharmacy address [address]    [address] parking

## 2019-12-23 NOTE — Progress Notes (Signed)
Patient ID: Samuel Blevins, male   DOB: Dec 08, 1965, 54 y.o.   MRN: [id number] This patient is seen today in the [place] has history of pancreatitis with pseudocyst and now duodenitis on MRI. Patient still has ongoing pancreatitis as well. He has been vomiting dark blood more recently. He states he is no longer drinking alcohol. We have been treating him for H. pylori with the Biaxin and metronidazole and he is completing the course of this treatment of 2 weeks. He ran out of his proton pump inhibitor and I have issued another refill for him. The patient is on opiates for his pain. Gastroenterology wanted him to start Creon and we will obtain this for the patient as well.  On exam blood pressure 130/70 saturation 96% room air pulse 100 abdomen remains tender in the epigastric area and distended remainder the exam is unremarkable impression is that of chronic pancreatitis and duodenitis need to rule out potential for duodenal ulcer  Plan is for patient return to gastroenterology on November 5 and he has a return visit with me as well on November 3 in the clinic

## 2019-12-23 NOTE — Telephone Encounter (Signed)
Pt contacted to advice that Rx has been sent to [facility] pharmacy under program to help him receive his medication. Pt aware

## 2019-12-23 NOTE — Telephone Encounter (Signed)
Pt states the pharmacy wanted $40 for the new Rx lipase/protease/amylase (CREON) 36000 UNITS CPEP capsule  That Dr [name] prescribed for him yesterday. Pt states he is not working and cannot afford this medication.  This med is for his stomach. Please advise

## 2019-12-23 NOTE — Telephone Encounter (Signed)
Copied from CRM [id number]. Topic: General - Other >> Dec 23, 2019  4:08 PM Herby Abraham C wrote: Reason for CRM: pt called in to make provider aware, pt says that he received Rx from pharmacy and really appreciate him. Pt also states that  GI has not called him for apt, pt would like further assistance.

## 2019-12-24 MED ORDER — OXYCODONE HCL 10 MG PO TABS
10.0000 mg | ORAL_TABLET | Freq: Four times a day (QID) | ORAL | 0 refills | Status: DC | PRN
Start: 2019-12-24 — End: 2020-01-04

## 2019-12-24 NOTE — Telephone Encounter (Signed)
Called pt to inform of 12/31/19 GI appt and gave time. LVM.

## 2019-12-24 NOTE — Telephone Encounter (Signed)
Let the pt know he has an appt with GI on November 5th.  Please determine the time and call him to remind him

## 2019-12-28 ENCOUNTER — Ambulatory Visit: Payer: 59 | Attending: Critical Care Medicine | Admitting: Critical Care Medicine

## 2019-12-28 ENCOUNTER — Encounter: Payer: Self-pay | Admitting: Critical Care Medicine

## 2019-12-28 ENCOUNTER — Other Ambulatory Visit: Payer: Self-pay

## 2019-12-28 ENCOUNTER — Other Ambulatory Visit: Payer: Self-pay | Admitting: Critical Care Medicine

## 2019-12-28 VITALS — BP 145/93 | HR 103 | Temp 97.9°F | Wt 170.4 lb

## 2019-12-28 DIAGNOSIS — F1020 Alcohol dependence, uncomplicated: Secondary | ICD-10-CM

## 2019-12-28 DIAGNOSIS — K2970 Gastritis, unspecified, without bleeding: Secondary | ICD-10-CM

## 2019-12-28 DIAGNOSIS — K863 Pseudocyst of pancreas: Secondary | ICD-10-CM

## 2019-12-28 DIAGNOSIS — K2990 Gastroduodenitis, unspecified, without bleeding: Secondary | ICD-10-CM

## 2019-12-28 DIAGNOSIS — G894 Chronic pain syndrome: Secondary | ICD-10-CM | POA: Diagnosis not present

## 2019-12-28 DIAGNOSIS — I10 Essential (primary) hypertension: Secondary | ICD-10-CM

## 2019-12-28 DIAGNOSIS — K8521 Alcohol induced acute pancreatitis with uninfected necrosis: Secondary | ICD-10-CM

## 2019-12-28 MED ORDER — THIAMINE HCL 100 MG PO TABS
100.0000 mg | ORAL_TABLET | Freq: Every day | ORAL | 3 refills | Status: DC
Start: 2019-12-28 — End: 2020-05-03

## 2019-12-28 MED ORDER — FOLIC ACID 1 MG PO TABS: 1.0000 mg | ORAL_TABLET | Freq: Every day | ORAL | 1 refills | Status: DC

## 2019-12-28 MED ORDER — CITALOPRAM HYDROBROMIDE 20 MG PO TABS: 20.0000 mg | ORAL_TABLET | Freq: Every day | ORAL | 3 refills | Status: DC

## 2019-12-28 MED FILL — CITALOPRAM HBR 20 MG TABLET: 20 | 30 days supply | Qty: 30 | Fill #0

## 2019-12-28 MED FILL — FOLIC ACID 1 MG TABS: 1 | 60 days supply | Qty: 60 | Fill #0

## 2019-12-28 NOTE — Assessment & Plan Note (Signed)
Secondary to alcohol use continue current medications

## 2019-12-28 NOTE — Progress Notes (Signed)
Subjective:    Patient ID: Samuel Blevins, male    DOB: 07-07-1965, 54 y.o.   MRN: [id number]  This is a 54 year old male who is seen in the mobile medicine clinic today October 28, 2019  Patient has longstanding history of alcohol use and actually I had been seeing him previously at the [facility] clinic.  He also has been seen at the [facility] by nurse practitioner at that site but can no longer go there as he now has [employer]   This patient is had a long standing history of alcohol use, essential hypertension, AVM of the small bowel, prior history of lobar pneumonia, gastritis and gastroduodenitis.  The patient was admitted actually August 13 to the behavioral health unit for alcohol intoxication and detox at the [facility].  Below is documentation from the discharge summary of that admission  Samuel Blevins is a 54 y.o. male who has long history of alcohol use disorder severe and presents to the ED requesting detox. Patient states that he drinks approximately 2-3 fifths per day and drinking daily. Patient was intoxicated on his arrival with the BAL was 434 on his admission. Per notes patient pulled out mini bottle of gin and drank it while he was in triage. His BAL was 434 on his admission. Patient states that extensive history of chronic alcohol abuse and increased his drinking lately. Patient states to several detoxes in the past and his last detox was 10 years ago and since then drinking daily basis. Patient denies history of withdrawal seizure or DTs. He denies other illicit drug usage. His UDS was negative. Patient denies suicidal or homicidal ideation. He denies hallucinations or psychosis. Patient admitted to inpatient behavioral health unit for alcohol detox with Librium.  During the course of the hospitalization patient is irritable, verbally abusive to staff and has poor insight. He did not complete his detox and is demanding discharge today.  Educated on need to complete detox but patient remains adamant. Educated on risk of seizures and worsening withdrawal symptoms to no avail. Patient is denying any acute withdrawal symptoms at present but is demanding Rx for Librium. Educated patient that this would not be provided and he would have to stay inpatient to complete detox. Patient was angry and verbally aggressive with this provider. He was not forthcoming and was minimally responsive to questions. He is minimally invested in treatment and has poor insight. He denies any mood disorders at present and is denying any SI, HI or AVH. After multiple attempts at convincing patient to complete detox and patients continued insistence for discharge he was sent home. He was given outpatient referral to [facility].   At time of discharge, patient is linear, direct, and organized; denies SI, HI, and AVH and is not currently significantly impaired, psychotic, or manic on exam. Detailed risk assessment is complete based on clinical exam and individual risk factors and acute suicide risk is low and acute violence risk is low. Individualized risk factors include: Addition and individualized protective factors include: positive family connectedness, no access to firearms, future oriented and participating in treatment plan. At this time, protective factors outweigh risk factors. Safety plan is created jointly which involves the patient following up with [facility] for med management and outpatient services. At this time, patient is educated and verbalized understanding of mental health resources and other crisis services in the community. They are instructed to call 911 and present to the nearest emergency room  should they experience any SI/HI/AVH or detrimental worsening of their mental health condition.  Regarding psychotropic medication treatment, the above psychotropic medications were prescribed and adjustment were made as described below (Medication  changes during this hospitalization). Physician(s) and team provided education to the client on the risk and benefits of treatment options, instructions for follow-up, and importance of adherence to chosen treatment. Medication side effects, possible drug interactions between the prescription meds and their interaction with recreational substances was discussed with the patient. No intolerant adverse side effects, were reported or noted, on day of discharge. Writer counseled the patient on ill effect of using recreational drugs and underscored the need for sobriety.  On , following sustained improvement in the affect of this patient, continued report of euthymic mood, repeated denial of suicidal, homicidal, and other violent ideation, adequate interaction with peers, active participation in groups while on the unit, and denial of adverse reactions from medications, the treatment team decided Samuel Blevins was stable for discharge home with scheduled mental health treatment as noted below.   The patient was given Librium and a tapering fashion and he apparently is now off this medication.  After discharge the patient noted increased back pain and abdominal pain and presented to the [facility], ER August 25.  Below is documentation from that visit  54 year old male with a history of hypertension, alcohol abuse, AVM of the small bowel, presents to the ER with 2 days of abdominal pain.  Patient reports a history of an abdominal hernia, which he thinks "busted".  He stated he felt a sudden onset of pain around his pelvic is approximately 2 days ago.  Has reported some episodes of nonbloody nonbilious vomiting.  Denies any difficulty with bowel movements or urination, denies any blood in his stool.  He also notes that he feels like his abdomen is swollen.  Denies any fevers, endorses some chills at night.  States the pain prevents him from sleeping well.  He has not taken anything for his pain.                           54 year old male resents with abdominal pain x2 days On presentation, he is alert, oriented, nontoxic-appearing, no acute distress.  Vitals overall reassuring.  Physical exam with periumbilical tenderness.  CBC without leukocytosis, hemoglobin of 10.5, improved from 8 3 months ago.  Lipase normal.  CMP with mild hypokalemia, received supplement in the ED and will send home with some as well.  Milld hypocalcemia of 8.5. No significant EKG changes. UA without evidence of UTI, he has some proteinurea of 30. CT of the abdomen with new multilobulated cystic process and a small fat-containing umbilical and supraumbilical ventral hernia without bowel involvement or inflammation.  Patient received 4 mg of morphine here in the ED.  He still has some pain, however there is no acute indication for surgery or admission.  Given his history of alcoholism, will hold opioid prescription.  Patient encouraged to take Tylenol or ibuprofen for pain.  Will refer to gastroenterology for the cyst and general surgery for the hernia.  Ambulatory referral was also placed as I question if the patient will follow up.  Return precautions discussed.  He voiced understanding and is agreeable.  At this stage in the ED course, the patient is medically screened and stable for discharge.  CT scan showed necrosis the pancreas with acute pancreatitis changes and pancreatic cyst formation all likely due to alcohol  use previously  The patient was referred to general surgery and gastroenterology for follow-up but the earliest appointments were early October  There is an umbilical hernia with supraumbilical ventral hernia without bowel involvement this is the reason for the surgery referral  Gastroenterology was to see the patient for the pancreatic cyst and pancreatitis  The patient was given Tylenol and ibuprofen for pain however this is not relieved the pain and is progressed to the point where he comes to the mobile unit  requesting additional attention  The patient has had nausea with this and some vomiting.  He has been off alcohol now since he was hospitalized for the detoxification.  He has sharp pain in his lower back as well in both flanks.  He has left hip pain as well.  He denies any fever.  12/28/2019 This patient is seen today in the clinic for the first time previously been seen in the mobile medicine unit and is followed at [facility] clinic.  The patient has had a recent MRI of the abdomen which has revealed resolution of the pancreatic cyst however he has pancreas divisum and also pancreatitis ongoing.  Also concerns for duodenitis and gastritis.  He is H. pylori positive.  He is just finishing now 2-week course of Biaxin and Flagyl and is on high-dose proton pump inhibitor.  He is still having episodes of vomiting and some loose stools.  He did have Creon added to his care plan.  He has an upcoming appointment this week with gastroenterology is encouraged to keep.  He still drinking hard liquor every few days.  He is having increased forgetfulness and balance issues.  On arrival blood pressure was 145/93.  The patient is on the oxycodone 10 mg every 6 hours as needed for pain.  He is here to get a pain contract established and a urine drug screen.  He scores very high today on his PHQ-9 for depression.  He is remains in the shelter and is attempting apply for disability as he is not able to work.  His only care gap is that he needs a colonoscopy which will have to be deferred for now.  Past Medical History:  Diagnosis Date  . Alcohol withdrawal syndrome with complication (HCC)   . Elevated AST (SGOT)   . Hypertension   . Symptomatic anemia 11/23/2015     Family History  Problem Relation Age of Onset  . Diabetes Mellitus II Neg Hx   . Colon cancer Neg Hx   . Stomach cancer Neg Hx   . Pancreatic cancer Neg Hx      Social History   Socioeconomic History  . Marital status: Single     Spouse name: Not on file  . Number of children: Not on file  . Years of education: Not on file  . Highest education level: Not on file  Occupational History  . Not on file  Tobacco Use  . Smoking status: Current Every Day Smoker    Packs/day: 1.00    Years: 30.00    Pack years: 30.00  . Smokeless tobacco: Never Used  Vaping Use  . Vaping Use: Never used  Substance and Sexual Activity  . Alcohol use: Yes    Alcohol/week: 24.0 standard drinks    Types: 24 Cans of beer per week    Comment: 6 cans of beer per day after getting off work  . Drug use: Never  . Sexual activity: Not Currently  Other Topics Concern  .  Not on file  Social History Narrative  . Not on file   Social Determinants of Health   Financial Resource Strain:   . Difficulty of Paying Living Expenses: Not on file  Food Insecurity:   . Worried About Programme researcher, broadcasting/film/video in the Last Year: Not on file  . Ran Out of Food in the Last Year: Not on file  Transportation Needs:   . Lack of Transportation (Medical): Not on file  . Lack of Transportation (Non-Medical): Not on file  Physical Activity:   . Days of Exercise per Week: Not on file  . Minutes of Exercise per Session: Not on file  Stress:   . Feeling of Stress : Not on file  Social Connections:   . Frequency of Communication with Friends and Family: Not on file  . Frequency of Social Gatherings with Friends and Family: Not on file  . Attends Religious Services: Not on file  . Active Member of Clubs or Organizations: Not on file  . Attends Banker Meetings: Not on file  . Marital Status: Not on file  Intimate Partner Violence:   . Fear of Current or Ex-Partner: Not on file  . Emotionally Abused: Not on file  . Physically Abused: Not on file  . Sexually Abused: Not on file     No Known Allergies   Outpatient Medications Prior to Visit  Medication Sig Dispense Refill  . clarithromycin (BIAXIN) 500 MG tablet Take 1 tablet (500 mg total) by  mouth 2 (two) times daily for 14 days. 28 tablet 0  . lipase/protease/amylase (CREON) 36000 UNITS CPEP capsule Take 1 capsule (36,000 Units total) by mouth 3 (three) times daily before meals. 180 capsule 1  . metroNIDAZOLE (FLAGYL) 500 MG tablet Take 1 tablet (500 mg total) by mouth 3 (three) times daily for 14 days. 42 tablet 0  . omeprazole (PRILOSEC) 40 MG capsule Take 1 capsule (40 mg total) by mouth 2 (two) times daily before a meal. 60 capsule 6  . ondansetron (ZOFRAN ODT) 8 MG disintegrating tablet Take 1 tablet (8 mg total) by mouth every 8 (eight) hours as needed for nausea or vomiting. 30 tablet 0  . Oxycodone HCl 10 MG TABS Take 1 tablet (10 mg total) by mouth every 6 (six) hours as needed. 40 tablet 0   No facility-administered medications prior to visit.      Review of Systems  Constitutional: Positive for appetite change and fatigue.  HENT: Negative.   Eyes: Negative.   Respiratory: Negative.   Cardiovascular: Negative.   Gastrointestinal: Positive for abdominal distention, abdominal pain, diarrhea, nausea and vomiting. Negative for anal bleeding, blood in stool, constipation and rectal pain.  Endocrine: Negative.   Genitourinary: Negative.   Musculoskeletal: Positive for back pain.       Left hip pain  Skin: Negative.   Neurological: Positive for dizziness, tremors, light-headedness and headaches. Negative for seizures and syncope.  Hematological: Negative.   Psychiatric/Behavioral: Positive for behavioral problems, dysphoric mood and sleep disturbance. Negative for self-injury and suicidal ideas.       Objective:   Physical Exam Vitals:   12/28/19 1000  BP: (!) 145/93  Pulse: (!) 103  Temp: 97.9 F (36.6 C)  SpO2: 97%  Weight: 170 lb 6.4 oz (77.3 kg)    Gen: Ill-appearing uncomfortable , in no distress,  normal affect  ENT: No lesions,  mouth clear,  oropharynx clear, no postnasal drip  Neck: No JVD, no TMG, no carotid bruits  Lungs: No use of accessory  muscles, no dullness to percussion, clear without rales or rhonchi  Cardiovascular: RRR, heart sounds normal, no murmur or gallops, no peripheral edema  Abdomen: Bowel sounds active, severe tenderness in the epigastric area and in the mid abdomen and also tenderness in the left and right mid quadrant areas tender as well in the left and right flanks posteriorly, there is guarding but no rebound  Musculoskeletal: No deformities, no cyanosis or clubbing  Neuro: alert, non focal  Skin: Warm, no lesions or rashes  CT Abdomen 10/20/19:    IMPRESSION: 1. Multilobulated cystic process either within or adjacent to the pancreatic head measuring 3.7 x 2.4 cm. There is mild adjacent edema/peripancreatic fat stranding. Findings are suspicious for acute pancreatitis with pseudocyst. Cystic lesion is age indeterminate with no prior abdominal imaging available. Recommend correlation with pancreatic enzymes and history of prior pancreatitis. Recommend further evaluation with pancreatic protocol MRI when patient is able to tolerate breath hold technique to exclude cystic pancreatic mass. 2. Small fat containing umbilical and supraumbilical ventral abdominal wall hernias without bowel involvement or inflammation. 3. Hepatic steatosis. 4. Mildly prominent prostate gland with mild nodular mass effect on the bladder base. 5. 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  Aortic Atherosclerosis (ICD10-I70.0).  MRI Abdomen10/14/21 IMPRESSION: 1. Previously seen cystic lesion along the pancreaticoduodenal groove is no longer well seen, and appears to have resolved. There is indistinctness of tissue planes in this vicinity suspicious for low-grade groove pancreatitis or less likely duodenitis. No definite mass lesion in this vicinity. The dorsal pancreatic duct connects to both the minor and the major papilla. 2. Marked diffuse hepatic steatosis. 3. Lumbar spondylosis and  degenerative disc disease at L4-5 and L5-S1. 4. Given the edema along the descending duodenum, I cannot exclude duodenitis, although groove pancreatitis is favored.    Assessment & Plan:  I personally reviewed all images and lab data in the [facility] system as well as any outside material available during this office visit and agree with the  radiology impressions.   Pancreatic pseudocyst Chronic pancreatitis with pseudocyst formation which appears to have resolved with a recent MRI of the abdomen  Follow-up gastroenterology  Pancreatitis, acute Acute pancreatitis secondary to alcohol use  Follow-up gastroenterology  Continue oxycodone for pain  Continue omeprazole twice daily and Creon supplements  Gastritis and gastroduodenitis Finish current course of Biaxin and metronidazole continue omeprazole twice daily follow-up in gastroenterology  Essential hypertension Secondary to alcohol use continue current medications  Chronic alcoholism (HCC) Begin folic acid and thiamine supplementation  Patient counseled regarding alcohol use  Chronic pain syndrome Chronic pain on the basis of alcohol use and chronic pancreatitis  Oxycodone was refilled pain contract signed [place] controlled substance database reviewed urine drug screen obtained   Ford was seen today for follow-up.  Diagnoses and all orders for this visit:  Alcohol-induced acute pancreatitis with uninfected necrosis -     Lipase -     CBC with Differential/Platelet  Chronic alcoholism (HCC) -     Comprehensive metabolic panel -     Vitamin B1  Chronic pain syndrome -     [id number]+[id number]+Alc+Crt-[id number]  Pancreatic pseudocyst  Gastritis and gastroduodenitis  Essential hypertension  Other orders -     thiamine 100 MG tablet; Take 1 tablet (100 mg total) by mouth daily. -     folic acid (FOLVITE) 1 MG tablet; Take 1 tablet (1 mg total) by mouth daily. -  citalopram (CELEXA) 20 MG tablet; Take 1 tablet  (20 mg total) by mouth daily.

## 2019-12-28 NOTE — Progress Notes (Signed)
Pt has a Rx for Citalopram in bag labeling is faded can not see mg prescribed by health dept Did not have BP med does not know if he takes one Pt is mixing pills to reduce amount of pill bottles he carrying around

## 2019-12-28 NOTE — Assessment & Plan Note (Signed)
Acute pancreatitis secondary to alcohol use  Follow-up gastroenterology  Continue oxycodone for pain  Continue omeprazole twice daily and Creon supplements

## 2019-12-28 NOTE — Assessment & Plan Note (Signed)
Finish current course of Biaxin and metronidazole continue omeprazole twice daily follow-up in gastroenterology

## 2019-12-28 NOTE — Assessment & Plan Note (Signed)
Chronic pancreatitis with pseudocyst formation which appears to have resolved with a recent MRI of the abdomen  Follow-up gastroenterology

## 2019-12-28 NOTE — Patient Instructions (Signed)
Begin citalopram 1 daily for depression  Finish your antibiotics  Continue omeprazole twice daily  Begin thiamine and folic acid daily  Continue Creon 3 times a day with meals  Continue your pain medications and a drug screen of the urine was obtained today and you will also sign a pain contract for the chronic pain  Keep your appointment with gastroenterology this week that is been scheduled  Avoid alcohol  I will talk with you about referring you to the behavioral health clinic for counseling  Labs today also obtained  Return to this clinic in 2 months   Alcohol Abuse and Nutrition Alcohol abuse is any pattern of alcohol consumption that harms your health, relationships, or work. Alcohol abuse can cause poor nutrition (malnutrition or malnourishment) and a lack of nutrients (nutrient deficiencies), which can lead to more complications. Alcohol abuse brings malnutrition and nutrient deficiencies in two ways:  It causes your liver to work abnormally. This affects how your body divides (breaks down) and absorbs nutrients from food.  It causes you to eat poorly. Many people who abuse alcohol do not eat enough carbohydrates, protein, fat, vitamins, and minerals. Nutrients that are commonly lacking (deficient) in people who abuse alcohol include:  Vitamins. ? Vitamin A. This is needed for your vision, metabolism, and ability to fight off infections (immunity). ? B vitamins. These include folate, thiamine, and niacin. These are needed for new cell growth. ? Vitamin C. This plays an important role in wound healing, immunity, and helping your body to absorb iron. ? Vitamin D. This is necessary for your body to absorb and use calcium. It is produced by your liver, but you can also get it from food and from sun exposure.  Minerals. ? Calcium. This is needed for healthy bones as well as heart and blood vessel (cardiovascular) function. ? Iron. This is important for blood, muscle, and  nervous system functioning. ? Magnesium. This plays an important role in muscle and nerve function, and it helps to control blood sugar and blood pressure. ? Zinc. This is important for the normal functioning of your nervous system and digestive system (gastrointestinal tract). If you think that you have an alcohol dependency problem, or if it is hard to stop drinking because you feel sick or different when you do not use alcohol, talk with your health care provider or another health professional about where to get help. Nutrition is an essential factor in therapy for alcohol abuse. Your health care provider or diet and nutrition specialist (dietitian) will work with you to design a plan that can help to restore nutrients to your body and prevent the risk of complications. What is my plan? Your dietitian may develop a specific eating plan that is based on your condition and any other problems that you have. An eating plan will commonly include:  A balanced diet. ? Grains: 6-8 oz (170-227 g) a day. Examples of 1 oz of whole grains include 1 cup of whole-wheat cereal,  cup of brown rice, or 1 slice of whole-wheat bread. ? Vegetables: 2-3 cups a day. Examples of 1 cup of vegetables include 2 medium carrots, 1 large tomato, or 2 stalks of celery. ? Fruits: 1-2 cups a day. Examples of 1 cup of fruit include 1 large banana, 1 small apple, 8 large strawberries, or 1 large orange. ? Meat and other protein: 5-6 oz (142-170 g) a day.  A cut of meat or fish that is the size of a deck of cards  is about 3-4 oz.  Foods that provide 1 oz of protein include 1 egg,  cup of nuts or seeds, or 1 tablespoon (16 g) of peanut butter. ? Dairy: 2-3 cups a day. Examples of 1 cup of dairy include 8 oz (230 mL) of milk, 8 oz (230 g) of yogurt, or 1 oz (44 g) of natural cheese.  Vitamin and mineral supplements. What are tips for following this plan?  Eat frequent meals and snacks. Try to eat 5-6 small meals each  day.  Take vitamin or mineral supplements as recommended by your dietitian.  If you are malnourished or if your dietitian recommends it: ? You may follow a high-protein, high-calorie diet. This may include:  2,000-3,000 calories (kilocalories) a day.  70-100 g (grams) of protein a day. ? You may be directed to follow a diet that includes a complete nutritional supplement beverage. This can help to restore calories, protein, and vitamins to your body. Depending on your condition, you may be advised to consume this beverage instead of your meals or in addition to them.  Certain medicines may cause changes in your appetite, taste, and weight. Work with your health care provider and dietitian to make any changes to your medicines and eating plan.  If you are unable to take in enough food and calories by mouth, your health care provider may recommend a feeding tube. This tube delivers nutritional supplements directly to your stomach. Recommended foods  Eat foods that are high in molecules that prevent oxygen from reacting with your food (antioxidants). These foods include grapes, berries, nuts, green tea, and dark green or orange vegetables. Eating these can help to prevent some of the stress that is placed on your liver by consuming alcohol.  Eat a variety of fresh fruits and vegetables each day. This will help you to get fiber and vitamins in your diet.  Drink plenty of water and other clear fluids, such as apple juice and broth. Try to drink at least 48-64 oz (1.5-2 L) of water a day.  Include foods fortified with vitamins and minerals in your diet. Commonly fortified foods include milk, orange juice, cereal, and bread.  Eat a variety of foods that are high in omega-3 and omega-6 fatty acids. These include fish, nuts and seeds, and soybeans. These foods may help your liver to recover and may also stabilize your mood.  If you are a vegetarian: ? Eat a variety of protein-rich foods. ? Pair  whole grains with plant-based proteins at meals and snack time. For example, eat rice with beans, put peanut butter on whole-grain toast, or eat oatmeal with sunflower seeds. The items listed above may not be a complete list of foods and beverages you can eat. Contact a dietitian for more information. Foods to avoid  Avoid foods and drinks that are high in fat and sugar. Sugary drinks, salty snacks, and candy contain empty calories. This means that they lack important nutrients such as protein, fiber, and vitamins.  Avoid alcohol. This is the best way to avoid malnutrition due to alcohol abuse. If you must drink, drink measured amounts. Measured drinking means limiting your intake to no more than 1 drink a day for nonpregnant women and 2 drinks a day for men. One drink equals 12 oz (355 mL) of beer, 5 oz (148 mL) of wine, or 1 oz (44 mL) of hard liquor.  Limit your intake of caffeine. Replace drinks like coffee and black tea with decaffeinated coffee and decaffeinated herbal  tea. The items listed above may not be a complete list of foods and beverages you should avoid. Contact a dietitian for more information. Summary  Alcohol abuse can cause poor nutrition (malnutrition or malnourishment) and a lack of nutrients (nutrient deficiencies), which can lead to more health problems.  Common nutrient deficiencies include vitamin deficiencies (A, B, C, and D) and mineral deficiencies (calcium, iron, magnesium, and zinc).  Nutrition is an essential factor in therapy for alcohol abuse.  Your health care provider and dietitian can help you to develop a specific eating plan that includes a balanced diet plus vitamin and mineral supplements. This information is not intended to replace advice given to you by your health care provider. Make sure you discuss any questions you have with your health care provider. Document Revised: 06/02/2018 Document Reviewed: 10/29/2016 Elsevier Patient Education  2020  [employer].

## 2019-12-28 NOTE — Assessment & Plan Note (Signed)
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

## 2019-12-28 NOTE — Assessment & Plan Note (Signed)
Begin folic acid and thiamine supplementation  Patient counseled regarding alcohol use

## 2019-12-29 ENCOUNTER — Encounter: Payer: Self-pay | Admitting: Critical Care Medicine

## 2019-12-30 ENCOUNTER — Telehealth: Payer: Self-pay

## 2019-12-30 NOTE — Telephone Encounter (Signed)
Called the patient. No answer. Left a message for the patient telling him his appointment tomorrow had to be moved due to my error. The time I had asked him to come did not exist as an appointment and there will not be a provider to see him. Next available appointment 01/11/20 at 11:00 am.  I have left this information and asked the patient to return my call.

## 2019-12-30 NOTE — Progress Notes (Signed)
This patient is seen in the [place] [facility] after having been seen a day previous [facility] and went over his lab results with him and indicated to him he should keep his upcoming appointment with gastroenterology which is on November 5 he states his symptoms are better since changing his medications I told him his liver enzymes are slightly increased and his lipase is elevated suggesting he is still drinking and he admitted that he has still drinking about every other day indicated to him he must quit drinking completely if he is to resolve all his symptoms

## 2019-12-31 ENCOUNTER — Other Ambulatory Visit: Payer: Self-pay | Admitting: Critical Care Medicine

## 2019-12-31 ENCOUNTER — Ambulatory Visit: Payer: 59 | Admitting: Physician Assistant

## 2019-12-31 NOTE — Telephone Encounter (Signed)
Requested medication (s) are due for refill today: yes   Requested medication (s) are on the active medication list: yes   Last refill: 12/24/2019  Future visit scheduled: no  Notes to clinic:  this refill cannot be delegated    Requested Prescriptions  Pending Prescriptions Disp Refills   Oxycodone HCl 10 MG TABS 40 tablet 0    Sig: Take 1 tablet (10 mg total) by mouth every 6 (six) hours as needed.      Not Delegated - Analgesics:  Opioid Agonists Failed - 12/31/2019 10:14 AM      Failed - This refill cannot be delegated      Passed - Urine Drug Screen completed in last 360 days      Passed - Valid encounter within last 6 months    Recent Outpatient Visits           3 days ago Alcohol-induced acute pancreatitis with uninfected necrosis   [facility] [name], MD

## 2019-12-31 NOTE — Telephone Encounter (Signed)
Patient is already schedule  for  01/11/20 @ 11am  with Brooke Dare

## 2019-12-31 NOTE — Telephone Encounter (Signed)
Pt confirmed appt for 01/11/2020 @11 :00am

## 2019-12-31 NOTE — Telephone Encounter (Signed)
Medication: Oxycodone HCl 10 MG TABS [[id number]]   Has the patient contacted their pharmacy? YES  (Agent: If no, request that the patient contact the pharmacy for the refill.) (Agent: If yes, when and what did the pharmacy advise?)  Preferred Pharmacy (with phone number or street name): [id number] - [place] ([place]), [place] - [address] [place] ([place]) [place] [address] Phone: [phone] Fax: [phone] Hours: Not open 24 hours    Agent: Please be advised that RX refills may take up to 3 business days. We ask that you follow-up with your pharmacy.

## 2019-12-31 NOTE — Telephone Encounter (Signed)
Called to inform pt of GI appt date and time. Left voice message

## 2019-12-31 NOTE — Telephone Encounter (Signed)
Called the patient. No answer. Left another message advising the patient not to come to the office today. Asked that he call me back to discuss a new appointment.

## 2019-12-31 NOTE — Telephone Encounter (Signed)
Called the patient. Again no answer. Left a message to call back. Do not use the bus pass. We have rescheduled the appointment.

## 2019-12-31 NOTE — Telephone Encounter (Signed)
Noted! Thank you

## 2020-01-04 ENCOUNTER — Telehealth: Payer: Self-pay | Admitting: Critical Care Medicine

## 2020-01-04 NOTE — Telephone Encounter (Signed)
Copied from CRM [id number]. Topic: General - Other >> Jan 04, 2020  9:09 AM Dalphine Handing A wrote: Patient requesting callback from Dr. Florene Route nurse in regards to oxycodone prescription request being denied. Patient stated that his ealiest fill date was 2 days ago but his request was still denied. Pateint wanting callback once medication has been sent to [facility] [address].

## 2020-01-04 NOTE — Addendum Note (Signed)
Addended by: Lois Huxley, Jeannett Senior L on: 01/04/2020 03:50 PM   Modules accepted: Orders

## 2020-01-04 NOTE — Telephone Encounter (Signed)
Pt calling stating that he is needing to have this refilled. He states that it is supposed to have this refilled tomorrow. Please advise.

## 2020-01-04 NOTE — Telephone Encounter (Signed)
Patient called again requesting his refill on his Oxycodone. He said it is no longer early and would like a refill or a call back stating why he can't have a refill. He can be reached at [phone]. Please advise

## 2020-01-05 MED ORDER — OXYCODONE HCL 10 MG PO TABS
10.0000 mg | ORAL_TABLET | Freq: Four times a day (QID) | ORAL | 0 refills | Status: DC | PRN
Start: 2020-01-05 — End: 2020-01-17

## 2020-01-05 NOTE — Telephone Encounter (Signed)
Rx was sent  

## 2020-01-06 LAB — OXYCODONE/OXYMORPHONE, CONFIRM
OXYCODONE/OXYMORPH: POSITIVE — AB
OXYCODONE: >3000 ng/mL
OXYCODONE: POSITIVE — AB
OXYMORPHONE (GC/MS): 875 ng/mL
OXYMORPHONE: POSITIVE — AB

## 2020-01-06 LAB — DRUG SCREEN 764883 11+OXYCO+ALC+CRT-BUND
Amphetamines, Urine: NEGATIVE ng/mL
BENZODIAZ UR QL: NEGATIVE ng/mL
Barbiturate: NEGATIVE ng/mL
Cannabinoid Quant, Ur: NEGATIVE ng/mL
Cocaine (Metabolite): NEGATIVE ng/mL
Creatinine: 441.3 mg/dL — ABNORMAL HIGH (ref 20.0–300.0)
Meperidine: NEGATIVE ng/mL
Methadone Screen, Urine: NEGATIVE ng/mL
Phencyclidine: NEGATIVE ng/mL
Propoxyphene: NEGATIVE ng/mL
Tramadol: NEGATIVE ng/mL
pH, Urine: 5.8 (ref 4.5–8.9)

## 2020-01-06 LAB — PANEL 764016: Ethanol: 0.062 %

## 2020-01-06 LAB — OPIATES CONFIRMATION, URINE: Opiates: NEGATIVE ng/mL

## 2020-01-07 LAB — CBC WITH DIFFERENTIAL/PLATELET
Basophils Absolute: 0 10*3/uL (ref 0.0–0.2)
Basos: 1 %
EOS (ABSOLUTE): 0 10*3/uL (ref 0.0–0.4)
Eos: 1 %
Hematocrit: 35.6 % — ABNORMAL LOW (ref 37.5–51.0)
Hemoglobin: 11.4 g/dL — ABNORMAL LOW (ref 13.0–17.7)
Immature Grans (Abs): 0 10*3/uL (ref 0.0–0.1)
Immature Granulocytes: 0 %
Lymphocytes Absolute: 1.2 10*3/uL (ref 0.7–3.1)
Lymphs: 28 %
MCH: 28.1 pg (ref 26.6–33.0)
MCHC: 32 g/dL (ref 31.5–35.7)
MCV: 88 fL (ref 79–97)
Monocytes Absolute: 0.6 10*3/uL (ref 0.1–0.9)
Monocytes: 14 %
Neutrophils Absolute: 2.5 10*3/uL (ref 1.4–7.0)
Neutrophils: 56 %
Platelets: 292 10*3/uL (ref 150–450)
RBC: 4.05 x10E6/uL — ABNORMAL LOW (ref 4.14–5.80)
RDW: 17.8 % — ABNORMAL HIGH (ref 11.6–15.4)
WBC: 4.4 10*3/uL (ref 3.4–10.8)

## 2020-01-07 LAB — COMPREHENSIVE METABOLIC PANEL
ALT: 83 IU/L — ABNORMAL HIGH (ref 0–44)
AST: 226 IU/L — ABNORMAL HIGH (ref 0–40)
Albumin/Globulin Ratio: 1.1 — ABNORMAL LOW (ref 1.2–2.2)
Albumin: 4.4 g/dL (ref 3.8–4.9)
Alkaline Phosphatase: 101 IU/L (ref 44–121)
BUN/Creatinine Ratio: 8 — ABNORMAL LOW (ref 9–20)
BUN: 6 mg/dL (ref 6–24)
Bilirubin Total: 0.6 mg/dL (ref 0.0–1.2)
CO2: 23 mmol/L (ref 20–29)
Calcium: 9.8 mg/dL (ref 8.7–10.2)
Chloride: 100 mmol/L (ref 96–106)
Creatinine, Ser: 0.76 mg/dL (ref 0.76–1.27)
GFR calc Af Amer: 119 mL/min/{1.73_m2} (ref >59)
GFR calc non Af Amer: 103 mL/min/{1.73_m2} (ref >59)
Globulin, Total: 4 g/dL (ref 1.5–4.5)
Glucose: 90 mg/dL (ref 65–99)
Potassium: 4 mmol/L (ref 3.5–5.2)
Sodium: 139 mmol/L (ref 134–144)
Total Protein: 8.4 g/dL (ref 6.0–8.5)

## 2020-01-07 LAB — LIPASE: Lipase: 94 U/L — ABNORMAL HIGH (ref 13–78)

## 2020-01-07 LAB — VITAMIN B1: Thiamine: 88.2 nmol/L (ref 66.5–200.0)

## 2020-01-11 ENCOUNTER — Ambulatory Visit: Payer: 59 | Admitting: Physician Assistant

## 2020-01-12 ENCOUNTER — Encounter: Payer: Self-pay | Admitting: Critical Care Medicine

## 2020-01-13 ENCOUNTER — Telehealth: Payer: Self-pay

## 2020-01-13 NOTE — Telephone Encounter (Signed)
-----   Message from Patrick E Wright, MD sent at 01/13/2020  6:52 AM EST ----- This pt needs OV with me in December   

## 2020-01-13 NOTE — Telephone Encounter (Signed)
-----   Message from [name], MD sent at 01/13/2020  6:52 AM EST ----- This pt needs OV with me in December

## 2020-01-13 NOTE — Progress Notes (Signed)
This is a 54 year old male seen in the [facility] he is here for a check-in visit.  He continues to occasionally drink alcohol but the volume of alcohol is much less.  He has history of pancreatitis and duodenal inflammation.  His abdominal pain is markedly improved his complaints today are that of left shoulder and left hip pain only.  On exam blood pressure 126/84 pulse is 90 saturation 97%  Patient is compliant with his folic acid, Celexa, Creon, omeprazole, Zofran, thiamine, and he received a refill on his oxycodone on November 10 and is taking this judiciously  He had to reschedule his November 16 appoint with gastroenterology I do not see an appointment scheduled in the system yet he says he will be following up on this  I will asked my office to reach out to him to schedule another appointment with me sometime in December

## 2020-01-13 NOTE — Telephone Encounter (Signed)
Called and lvm for the pt that he has an appointment with dr [name] on dec 27 at 10

## 2020-01-17 ENCOUNTER — Other Ambulatory Visit: Payer: Self-pay | Admitting: Critical Care Medicine

## 2020-01-17 MED ORDER — OXYCODONE HCL 10 MG PO TABS
10.0000 mg | ORAL_TABLET | Freq: Four times a day (QID) | ORAL | 0 refills | Status: DC | PRN
Start: 2020-01-17 — End: 2020-02-02

## 2020-01-17 NOTE — Telephone Encounter (Signed)
Copied from CRM [id number]. Topic: Quick Communication - Rx Refill/Question >> Jan 17, 2020 11:04 AM Marylen Ponto wrote: Medication: Oxycodone HCl 10 MG TABS  Has the patient contacted their pharmacy? no  Preferred Pharmacy (with phone number or street name): [facility] - [place] ([place]), [place] - [name]  Phone: [phone]   Fax: [phone]  Agent: Please be advised that RX refills may take up to 3 business days. We ask that you follow-up with your pharmacy.

## 2020-01-17 NOTE — Telephone Encounter (Signed)
Requested medication (s) are due for refill today:  Yes  Requested medication (s) are on the active medication list:  Yes  Future visit scheduled:  Yes  Last Refill: 01/05/20; #40; no refills  Notes to clinic: not delegated  Requested Prescriptions  Pending Prescriptions Disp Refills   Oxycodone HCl 10 MG TABS 40 tablet 0    Sig: Take 1 tablet (10 mg total) by mouth every 6 (six) hours as needed.      Not Delegated - Analgesics:  Opioid Agonists Failed - 01/17/2020 11:09 AM      Failed - This refill cannot be delegated      Passed - Urine Drug Screen completed in last 360 days      Passed - Valid encounter within last 6 months    Recent Outpatient Visits           2 weeks ago Alcohol-induced acute pancreatitis with uninfected necrosis   [facility] [name], MD       Future Appointments             In 1 month [name] Charlcie Cradle, MD [facility]

## 2020-02-01 ENCOUNTER — Telehealth: Payer: Self-pay | Admitting: Critical Care Medicine

## 2020-02-01 NOTE — Telephone Encounter (Signed)
I would like to hold off on pain medications.  He needs to have see the stomach doctor, he missed his November appt,  Did he reschedule?   He has an appt with me on Dec 13 will discuss then.   His studies show his pancreatitis have resolved , we need to manage him without opiates

## 2020-02-01 NOTE — Telephone Encounter (Signed)
Pt is requesting refills on listed medications.

## 2020-02-01 NOTE — Telephone Encounter (Signed)
Medication Refill - Medication: Pt needs a refill for Oxycodone HCl 10 MG TABS   Pt also mentioned needing refill for anxiety medicine  As well / doesn't know the name / Pt also asked for a call from Dr. [name] today/ please advise   Pt also asked about where he is suppose to go for mental health  [facility] - [place] ([place] - [address]  [address] [place] ([place]) [place] [address]  Phone:  [phone] Fax:  [phone]

## 2020-02-02 ENCOUNTER — Encounter: Payer: Self-pay | Admitting: Critical Care Medicine

## 2020-02-02 ENCOUNTER — Other Ambulatory Visit: Payer: Self-pay | Admitting: Critical Care Medicine

## 2020-02-02 MED ORDER — CITALOPRAM HYDROBROMIDE 20 MG PO TABS
20.0000 mg | ORAL_TABLET | Freq: Every day | ORAL | 3 refills | Status: DC
Start: 2020-02-02 — End: 2020-02-02

## 2020-02-02 MED FILL — CITALOPRAM HBR 20 MG TABLET: 20 | 30 days supply | Qty: 30 | Fill #0

## 2020-02-02 NOTE — Telephone Encounter (Signed)
Pt calling and is requesting to have an update on these prescriptions. Please advise.

## 2020-02-02 NOTE — Progress Notes (Signed)
Patient ID: Samuel Blevins, male   DOB: 09/10/1965, 54 y.o.   MRN: [id number] This is a 5 chest is clear card area ns of severe pain in the left hip and also in the left upper quadrant.  He states his abdominal pain is actually better over time his pain now is more in the lower back.  He is also out of his citalopram and is experiencing quite a bit of inks anxiety.  He is still drinking an airplane bottle full of liquor about every other day.  He also has been buying over the street street oxycodone pain cash for this.  He is out of his oxycodone we prescribed previously we told him we had stopped this medication because he no longer needed it because the pancreatitis had resolved.  On exam blood pressure is 166/80 pulse 111 saturation 96% room air abdomen slightly tender epigastric area left hip tenexam tachycardia impression is that of ongoing alcohol abuse chronic lumbar pain pancreatitis had resolved ongoing gastritis  Patient has existing appointment with gastroenterology on December 20 he is encouraged to keep this he also is an appointment with me December 13 he is encouraged to keep this as well  I gave the patient a sample Salonpas patches for the back, I gave the patient samples of omeprazole to take for his gastritis I suggested he continue to reduce his alcohol intake and that he take Tylenol 500 mg no more than 6 of these daily for pain I refilled his citalopram and I will see him in the office on December 13

## 2020-02-06 NOTE — Progress Notes (Deleted)
Subjective:    Patient ID: Samuel Blevins, male    DOB: 07-07-1965, 54 y.o.   MRN: [id number]  This is a 54 year old male who is seen in the mobile medicine clinic today October 28, 2019  Patient has longstanding history of alcohol use and actually I had been seeing him previously at the [facility] clinic.  He also has been seen at the [facility] by nurse practitioner at that site but can no longer go there as he now has [employer]   This patient is had a long standing history of alcohol use, essential hypertension, AVM of the small bowel, prior history of lobar pneumonia, gastritis and gastroduodenitis.  The patient was admitted actually August 13 to the behavioral health unit for alcohol intoxication and detox at the [facility].  Below is documentation from the discharge summary of that admission  Samuel Blevins is a 54 y.o. male who has long history of alcohol use disorder severe and presents to the ED requesting detox. Patient states that he drinks approximately 2-3 fifths per day and drinking daily. Patient was intoxicated on his arrival with the BAL was 434 on his admission. Per notes patient pulled out mini bottle of gin and drank it while he was in triage. His BAL was 434 on his admission. Patient states that extensive history of chronic alcohol abuse and increased his drinking lately. Patient states to several detoxes in the past and his last detox was 10 years ago and since then drinking daily basis. Patient denies history of withdrawal seizure or DTs. He denies other illicit drug usage. His UDS was negative. Patient denies suicidal or homicidal ideation. He denies hallucinations or psychosis. Patient admitted to inpatient behavioral health unit for alcohol detox with Librium.  During the course of the hospitalization patient is irritable, verbally abusive to staff and has poor insight. He did not complete his detox and is demanding discharge today.  Educated on need to complete detox but patient remains adamant. Educated on risk of seizures and worsening withdrawal symptoms to no avail. Patient is denying any acute withdrawal symptoms at present but is demanding Rx for Librium. Educated patient that this would not be provided and he would have to stay inpatient to complete detox. Patient was angry and verbally aggressive with this provider. He was not forthcoming and was minimally responsive to questions. He is minimally invested in treatment and has poor insight. He denies any mood disorders at present and is denying any SI, HI or AVH. After multiple attempts at convincing patient to complete detox and patients continued insistence for discharge he was sent home. He was given outpatient referral to [facility].   At time of discharge, patient is linear, direct, and organized; denies SI, HI, and AVH and is not currently significantly impaired, psychotic, or manic on exam. Detailed risk assessment is complete based on clinical exam and individual risk factors and acute suicide risk is low and acute violence risk is low. Individualized risk factors include: Addition and individualized protective factors include: positive family connectedness, no access to firearms, future oriented and participating in treatment plan. At this time, protective factors outweigh risk factors. Safety plan is created jointly which involves the patient following up with [facility] for med management and outpatient services. At this time, patient is educated and verbalized understanding of mental health resources and other crisis services in the community. They are instructed to call 911 and present to the nearest emergency room  should they experience any SI/HI/AVH or detrimental worsening of their mental health condition.  Regarding psychotropic medication treatment, the above psychotropic medications were prescribed and adjustment were made as described below (Medication  changes during this hospitalization). Physician(s) and team provided education to the client on the risk and benefits of treatment options, instructions for follow-up, and importance of adherence to chosen treatment. Medication side effects, possible drug interactions between the prescription meds and their interaction with recreational substances was discussed with the patient. No intolerant adverse side effects, were reported or noted, on day of discharge. Writer counseled the patient on ill effect of using recreational drugs and underscored the need for sobriety.  On , following sustained improvement in the affect of this patient, continued report of euthymic mood, repeated denial of suicidal, homicidal, and other violent ideation, adequate interaction with peers, active participation in groups while on the unit, and denial of adverse reactions from medications, the treatment team decided Samuel Blevins was stable for discharge home with scheduled mental health treatment as noted below.   The patient was given Librium and a tapering fashion and he apparently is now off this medication.  After discharge the patient noted increased back pain and abdominal pain and presented to the [facility], ER August 25.  Below is documentation from that visit  54 year old male with a history of hypertension, alcohol abuse, AVM of the small bowel, presents to the ER with 2 days of abdominal pain.  Patient reports a history of an abdominal hernia, which he thinks "busted".  He stated he felt a sudden onset of pain around his pelvic is approximately 2 days ago.  Has reported some episodes of nonbloody nonbilious vomiting.  Denies any difficulty with bowel movements or urination, denies any blood in his stool.  He also notes that he feels like his abdomen is swollen.  Denies any fevers, endorses some chills at night.  States the pain prevents him from sleeping well.  He has not taken anything for his pain.                           54 year old male resents with abdominal pain x2 days On presentation, he is alert, oriented, nontoxic-appearing, no acute distress.  Vitals overall reassuring.  Physical exam with periumbilical tenderness.  CBC without leukocytosis, hemoglobin of 10.5, improved from 8 3 months ago.  Lipase normal.  CMP with mild hypokalemia, received supplement in the ED and will send home with some as well.  Milld hypocalcemia of 8.5. No significant EKG changes. UA without evidence of UTI, he has some proteinurea of 30. CT of the abdomen with new multilobulated cystic process and a small fat-containing umbilical and supraumbilical ventral hernia without bowel involvement or inflammation.  Patient received 4 mg of morphine here in the ED.  He still has some pain, however there is no acute indication for surgery or admission.  Given his history of alcoholism, will hold opioid prescription.  Patient encouraged to take Tylenol or ibuprofen for pain.  Will refer to gastroenterology for the cyst and general surgery for the hernia.  Ambulatory referral was also placed as I question if the patient will follow up.  Return precautions discussed.  He voiced understanding and is agreeable.  At this stage in the ED course, the patient is medically screened and stable for discharge.  CT scan showed necrosis the pancreas with acute pancreatitis changes and pancreatic cyst formation all likely due to alcohol  use previously  The patient was referred to general surgery and gastroenterology for follow-up but the earliest appointments were early October  There is an umbilical hernia with supraumbilical ventral hernia without bowel involvement this is the reason for the surgery referral  Gastroenterology was to see the patient for the pancreatic cyst and pancreatitis  The patient was given Tylenol and ibuprofen for pain however this is not relieved the pain and is progressed to the point where he comes to the mobile unit  requesting additional attention  The patient has had nausea with this and some vomiting.  He has been off alcohol now since he was hospitalized for the detoxification.  He has sharp pain in his lower back as well in both flanks.  He has left hip pain as well.  He denies any fever.  12/28/2019 This patient is seen today in the clinic for the first time previously been seen in the mobile medicine unit and is followed at [facility] clinic.  The patient has had a recent MRI of the abdomen which has revealed resolution of the pancreatic cyst however he has pancreas divisum and also pancreatitis ongoing.  Also concerns for duodenitis and gastritis.  He is H. pylori positive.  He is just finishing now 2-week course of Biaxin and Flagyl and is on high-dose proton pump inhibitor.  He is still having episodes of vomiting and some loose stools.  He did have Creon added to his care plan.  He has an upcoming appointment this week with gastroenterology is encouraged to keep.  He still drinking hard liquor every few days.  He is having increased forgetfulness and balance issues.  On arrival blood pressure was 145/93.  The patient is on the oxycodone 10 mg every 6 hours as needed for pain.  He is here to get a pain contract established and a urine drug screen.  He scores very high today on his PHQ-9 for depression.  He is remains in the shelter and is attempting apply for disability as he is not able to work.  His only care gap is that he needs a colonoscopy which will have to be deferred for now.  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 which appears to have resolved with a recent MRI of the abdomen  Follow-up gastroenterology  Pancreatitis, acute Acute pancreatitis secondary to alcohol use  Follow-up gastroenterology  Continue oxycodone for pain  Continue omeprazole twice daily and Creon supplements  Gastritis and gastroduodenitis Finish current course of  Biaxin and metronidazole continue omeprazole twice daily follow-up in gastroenterology  Essential hypertension Secondary to alcohol use continue current medications  Chronic alcoholism (HCC) Begin folic acid and thiamine supplementation  Patient counseled regarding alcohol use  Chronic pain syndrome Chronic pain on the basis of alcohol use and chronic pancreatitis  Oxycodone was refilled pain contract signed [place] controlled substance database reviewed urine drug screen obtained   Samuel Blevins was seen today for follow-up.  Diagnoses and all orders for this visit:  Alcohol-induced acute pancreatitis with uninfected necrosis -     Lipase -     CBC with Differential/Platelet  Chronic alcoholism (HCC) -     Comprehensive metabolic panel -     Vitamin B1  Chronic pain syndrome -     [id number]+Oxyco+Alc+Crt-Bund  Pancreatic pseudocyst  Gastritis and gastroduodenitis  Essential hypertension  Other orders -     thiamine 100 MG tablet; Take 1 tablet (100 mg total) by mouth daily. -  folic acid (FOLVITE) 1 MG tablet; Take 1 tablet (1 mg total) by mouth daily. -     citalopram (CELEXA) 20 MG tablet; Take 1 tablet (20 mg total) by mouth daily.      Past Medical History:  Diagnosis Date  . Alcohol withdrawal syndrome with complication (HCC)   . Elevated AST (SGOT)   . Hypertension   . Symptomatic anemia 11/23/2015     Family History  Problem Relation Age of Onset  . Diabetes Mellitus II Neg Hx   . Colon cancer Neg Hx   . Stomach cancer Neg Hx   . Pancreatic cancer Neg Hx      Social History   Socioeconomic History  . Marital status: Single    Spouse name: Not on file  . Number of children: Not on file  . Years of education: Not on file  . Highest education level: Not on file  Occupational History  . Not on file  Tobacco Use  . Smoking status: Current Every Day Smoker    Packs/day: 1.00    Years: 30.00    Pack years: 30.00  . Smokeless tobacco:  Never Used  Vaping Use  . Vaping Use: Never used  Substance and Sexual Activity  . Alcohol use: Yes    Alcohol/week: 24.0 standard drinks    Types: 24 Cans of beer per week    Comment: 6 cans of beer per day after getting off work  . Drug use: Never  . Sexual activity: Not Currently  Other Topics Concern  . Not on file  Social History Narrative  . Not on file   Social Determinants of Health   Financial Resource Strain: Not on file  Food Insecurity: Not on file  Transportation Needs: Not on file  Physical Activity: Not on file  Stress: Not on file  Social Connections: Not on file  Intimate Partner Violence: Not on file     No Known Allergies   Outpatient Medications Prior to Visit  Medication Sig Dispense Refill  . citalopram (CELEXA) 20 MG tablet Take 1 tablet (20 mg total) by mouth daily. 30 tablet 3  . folic acid (FOLVITE) 1 MG tablet Take 1 tablet (1 mg total) by mouth daily. 60 tablet 1  . omeprazole (PRILOSEC) 40 MG capsule Take 1 capsule (40 mg total) by mouth 2 (two) times daily before a meal. 60 capsule 6  . ondansetron (ZOFRAN ODT) 8 MG disintegrating tablet Take 1 tablet (8 mg total) by mouth every 8 (eight) hours as needed for nausea or vomiting. 30 tablet 0  . thiamine 100 MG tablet Take 1 tablet (100 mg total) by mouth daily. 60 tablet 3   No facility-administered medications prior to visit.      Review of Systems  Constitutional: Positive for appetite change and fatigue.  HENT: Negative.   Eyes: Negative.   Respiratory: Negative.   Cardiovascular: Negative.   Gastrointestinal: Positive for abdominal distention, abdominal pain, diarrhea, nausea and vomiting. Negative for anal bleeding, blood in stool, constipation and rectal pain.  Endocrine: Negative.   Genitourinary: Negative.   Musculoskeletal: Positive for back pain.       Left hip pain  Skin: Negative.   Neurological: Positive for dizziness, tremors, light-headedness and headaches. Negative for  seizures and syncope.  Hematological: Negative.   Psychiatric/Behavioral: Positive for behavioral problems, dysphoric mood and sleep disturbance. Negative for self-injury and suicidal ideas.       Objective:   Physical Exam There were no vitals  filed for this visit.  Gen: Ill-appearing uncomfortable , in no distress,  normal affect  ENT: No lesions,  mouth clear,  oropharynx clear, no postnasal drip  Neck: No JVD, no TMG, no carotid bruits  Lungs: No use of accessory muscles, no dullness to percussion, clear without rales or rhonchi  Cardiovascular: RRR, heart sounds normal, no murmur or gallops, no peripheral edema  Abdomen: Bowel sounds active, severe tenderness in the epigastric area and in the mid abdomen and also tenderness in the left and right mid quadrant areas tender as well in the left and right flanks posteriorly, there is guarding but no rebound  Musculoskeletal: No deformities, no cyanosis or clubbing  Neuro: alert, non focal  Skin: Warm, no lesions or rashes  CT Abdomen 10/20/19:    IMPRESSION: 1. Multilobulated cystic process either within or adjacent to the pancreatic head measuring 3.7 x 2.4 cm. There is mild adjacent edema/peripancreatic fat stranding. Findings are suspicious for acute pancreatitis with pseudocyst. Cystic lesion is age indeterminate with no prior abdominal imaging available. Recommend correlation with pancreatic enzymes and history of prior pancreatitis. Recommend further evaluation with pancreatic protocol MRI when patient is able to tolerate breath hold technique to exclude cystic pancreatic mass. 2. Small fat containing umbilical and supraumbilical ventral abdominal wall hernias without bowel involvement or inflammation. 3. Hepatic steatosis. 4. Mildly prominent prostate gland with mild nodular mass effect on the bladder base. 5. 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  Aortic Atherosclerosis  (ICD10-I70.0).  MRI Abdomen10/14/21 IMPRESSION: 1. Previously seen cystic lesion along the pancreaticoduodenal groove is no longer well seen, and appears to have resolved. There is indistinctness of tissue planes in this vicinity suspicious for low-grade groove pancreatitis or less likely duodenitis. No definite mass lesion in this vicinity. The dorsal pancreatic duct connects to both the minor and the major papilla. 2. Marked diffuse hepatic steatosis. 3. Lumbar spondylosis and degenerative disc disease at L4-5 and L5-S1. 4. Given the edema along the descending duodenum, I cannot exclude duodenitis, although groove pancreatitis is favored.    Assessment & Plan:  I personally reviewed all images and lab data in the [facility] system as well as any outside material available during this office visit and agree with the  radiology impressions.   No problem-specific Assessment & Plan notes found for this encounter.   There are no diagnoses linked to this encounter.

## 2020-02-07 ENCOUNTER — Ambulatory Visit: Payer: 59 | Admitting: Critical Care Medicine

## 2020-02-10 ENCOUNTER — Telehealth: Payer: Self-pay | Admitting: Critical Care Medicine

## 2020-02-10 NOTE — Telephone Encounter (Signed)
Copied from CRM [id number]. Topic: General - Other >> Feb 08, 2020 12:58 PM Tamela Oddi wrote: Reason for CRM: Patient would like the nurse to call him regarding an appt. That was cancelled.  Please call patient to discuss at [phone]   I see where appt was cancelled due to provider out of office. Because appointment notes said per [name], If you let me know when I can double book provider I will call patient and schedule.

## 2020-02-14 ENCOUNTER — Ambulatory Visit: Payer: 59 | Admitting: Physician Assistant

## 2020-02-14 NOTE — Telephone Encounter (Signed)
Pt has appt w/[facility] 02/29/20

## 2020-02-21 ENCOUNTER — Ambulatory Visit: Payer: 59 | Admitting: Critical Care Medicine

## 2020-02-27 NOTE — Progress Notes (Deleted)
Subjective:    Patient ID: Samuel Blevins, male    DOB: 07-07-1965, 55 y.o.   MRN: [id number]  This is a 55 year old male who is seen in the mobile medicine clinic today October 28, 2019  Patient has longstanding history of alcohol use and actually I had been seeing him previously at the [facility] clinic.  He also has been seen at the [facility] by nurse practitioner at that site but can no longer go there as he now has [employer]   This patient is had a long standing history of alcohol use, essential hypertension, AVM of the small bowel, prior history of lobar pneumonia, gastritis and gastroduodenitis.  The patient was admitted actually August 13 to the behavioral health unit for alcohol intoxication and detox at the [facility].  Below is documentation from the discharge summary of that admission  Samuel Blevins is a 55 y.o. male who has long history of alcohol use disorder severe and presents to the ED requesting detox. Patient states that he drinks approximately 2-3 fifths per day and drinking daily. Patient was intoxicated on his arrival with the BAL was 434 on his admission. Per notes patient pulled out mini bottle of gin and drank it while he was in triage. His BAL was 434 on his admission. Patient states that extensive history of chronic alcohol abuse and increased his drinking lately. Patient states to several detoxes in the past and his last detox was 10 years ago and since then drinking daily basis. Patient denies history of withdrawal seizure or DTs. He denies other illicit drug usage. His UDS was negative. Patient denies suicidal or homicidal ideation. He denies hallucinations or psychosis. Patient admitted to inpatient behavioral health unit for alcohol detox with Librium.  During the course of the hospitalization patient is irritable, verbally abusive to staff and has poor insight. He did not complete his detox and is demanding discharge today.  Educated on need to complete detox but patient remains adamant. Educated on risk of seizures and worsening withdrawal symptoms to no avail. Patient is denying any acute withdrawal symptoms at present but is demanding Rx for Librium. Educated patient that this would not be provided and he would have to stay inpatient to complete detox. Patient was angry and verbally aggressive with this provider. He was not forthcoming and was minimally responsive to questions. He is minimally invested in treatment and has poor insight. He denies any mood disorders at present and is denying any SI, HI or AVH. After multiple attempts at convincing patient to complete detox and patients continued insistence for discharge he was sent home. He was given outpatient referral to [facility].   At time of discharge, patient is linear, direct, and organized; denies SI, HI, and AVH and is not currently significantly impaired, psychotic, or manic on exam. Detailed risk assessment is complete based on clinical exam and individual risk factors and acute suicide risk is low and acute violence risk is low. Individualized risk factors include: Addition and individualized protective factors include: positive family connectedness, no access to firearms, future oriented and participating in treatment plan. At this time, protective factors outweigh risk factors. Safety plan is created jointly which involves the patient following up with [facility] for med management and outpatient services. At this time, patient is educated and verbalized understanding of mental health resources and other crisis services in the community. They are instructed to call 911 and present to the nearest emergency room  should they experience any SI/HI/AVH or detrimental worsening of their mental health condition.  Regarding psychotropic medication treatment, the above psychotropic medications were prescribed and adjustment were made as described below (Medication  changes during this hospitalization). Physician(s) and team provided education to the client on the risk and benefits of treatment options, instructions for follow-up, and importance of adherence to chosen treatment. Medication side effects, possible drug interactions between the prescription meds and their interaction with recreational substances was discussed with the patient. No intolerant adverse side effects, were reported or noted, on day of discharge. Writer counseled the patient on ill effect of using recreational drugs and underscored the need for sobriety.  On , following sustained improvement in the affect of this patient, continued report of euthymic mood, repeated denial of suicidal, homicidal, and other violent ideation, adequate interaction with peers, active participation in groups while on the unit, and denial of adverse reactions from medications, the treatment team decided Samuel Blevins was stable for discharge home with scheduled mental health treatment as noted below.   The patient was given Librium and a tapering fashion and he apparently is now off this medication.  After discharge the patient noted increased back pain and abdominal pain and presented to the [facility], ER August 25.  Below is documentation from that visit  55 year old male with a history of hypertension, alcohol abuse, AVM of the small bowel, presents to the ER with 2 days of abdominal pain.  Patient reports a history of an abdominal hernia, which he thinks "busted".  He stated he felt a sudden onset of pain around his pelvic is approximately 2 days ago.  Has reported some episodes of nonbloody nonbilious vomiting.  Denies any difficulty with bowel movements or urination, denies any blood in his stool.  He also notes that he feels like his abdomen is swollen.  Denies any fevers, endorses some chills at night.  States the pain prevents him from sleeping well.  He has not taken anything for his pain.                           55 year old male resents with abdominal pain x2 days On presentation, he is alert, oriented, nontoxic-appearing, no acute distress.  Vitals overall reassuring.  Physical exam with periumbilical tenderness.  CBC without leukocytosis, hemoglobin of 10.5, improved from 8 3 months ago.  Lipase normal.  CMP with mild hypokalemia, received supplement in the ED and will send home with some as well.  Milld hypocalcemia of 8.5. No significant EKG changes. UA without evidence of UTI, he has some proteinurea of 30. CT of the abdomen with new multilobulated cystic process and a small fat-containing umbilical and supraumbilical ventral hernia without bowel involvement or inflammation.  Patient received 4 mg of morphine here in the ED.  He still has some pain, however there is no acute indication for surgery or admission.  Given his history of alcoholism, will hold opioid prescription.  Patient encouraged to take Tylenol or ibuprofen for pain.  Will refer to gastroenterology for the cyst and general surgery for the hernia.  Ambulatory referral was also placed as I question if the patient will follow up.  Return precautions discussed.  He voiced understanding and is agreeable.  At this stage in the ED course, the patient is medically screened and stable for discharge.  CT scan showed necrosis the pancreas with acute pancreatitis changes and pancreatic cyst formation all likely due to alcohol  use previously  The patient was referred to general surgery and gastroenterology for follow-up but the earliest appointments were early October  There is an umbilical hernia with supraumbilical ventral hernia without bowel involvement this is the reason for the surgery referral  Gastroenterology was to see the patient for the pancreatic cyst and pancreatitis  The patient was given Tylenol and ibuprofen for pain however this is not relieved the pain and is progressed to the point where he comes to the mobile unit  requesting additional attention  The patient has had nausea with this and some vomiting.  He has been off alcohol now since he was hospitalized for the detoxification.  He has sharp pain in his lower back as well in both flanks.  He has left hip pain as well.  He denies any fever.  12/28/2019 This patient is seen today in the clinic for the first time previously been seen in the mobile medicine unit and is followed at [facility] clinic.  The patient has had a recent MRI of the abdomen which has revealed resolution of the pancreatic cyst however he has pancreas divisum and also pancreatitis ongoing.  Also concerns for duodenitis and gastritis.  He is H. pylori positive.  He is just finishing now 2-week course of Biaxin and Flagyl and is on high-dose proton pump inhibitor.  He is still having episodes of vomiting and some loose stools.  He did have Creon added to his care plan.  He has an upcoming appointment this week with gastroenterology is encouraged to keep.  He still drinking hard liquor every few days.  He is having increased forgetfulness and balance issues.  On arrival blood pressure was 145/93.  The patient is on the oxycodone 10 mg every 6 hours as needed for pain.  He is here to get a pain contract established and a urine drug screen.  He scores very high today on his PHQ-9 for depression.  He is remains in the shelter and is attempting apply for disability as he is not able to work.  His only care gap is that he needs a colonoscopy which will have to be deferred for now.  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 which appears to have resolved with a recent MRI of the abdomen  Follow-up gastroenterology  Pancreatitis, acute Acute pancreatitis secondary to alcohol use  Follow-up gastroenterology  Continue oxycodone for pain  Continue omeprazole twice daily and Creon supplements  Gastritis and  gastroduodenitis Finish current course of Biaxin and metronidazole continue omeprazole twice daily follow-up in gastroenterology  Essential hypertension Secondary to alcohol use continue current medications  Chronic alcoholism (HCC) Begin folic acid and thiamine supplementation  Patient counseled regarding alcohol use  Chronic pain syndrome Chronic pain on the basis of alcohol use and chronic pancreatitis  Oxycodone was refilled pain contract signed [place] controlled substance database reviewed urine drug screen obtained   Samuel Blevins was seen today for follow-up.  Diagnoses and all orders for this visit:  Alcohol-induced acute pancreatitis with uninfected necrosis -     Lipase -     CBC with Differential/Platelet  Chronic alcoholism (HCC) -     Comprehensive metabolic panel -     Vitamin B1  Chronic pain syndrome -     [id number]+Oxyco+Alc+Crt-Bund  Pancreatic pseudocyst  Gastritis and gastroduodenitis  Essential hypertension  Other orders -     thiamine 100 MG tablet; Take 1 tablet (100  mg total) by mouth daily. -     folic acid (FOLVITE) 1 MG tablet; Take 1 tablet (1 mg total) by mouth daily. -     citalopram (CELEXA) 20 MG tablet; Take 1 tablet (20 mg total) by mouth daily.     Past Medical History:  Diagnosis Date  . Alcohol withdrawal syndrome with complication (HCC)   . Elevated AST (SGOT)   . Hypertension   . Symptomatic anemia 11/23/2015     Family History  Problem Relation Age of Onset  . Diabetes Mellitus II Neg Hx   . Colon cancer Neg Hx   . Stomach cancer Neg Hx   . Pancreatic cancer Neg Hx      Social History   Socioeconomic History  . Marital status: Single    Spouse name: Not on file  . Number of children: Not on file  . Years of education: Not on file  . Highest education level: Not on file  Occupational History  . Not on file  Tobacco Use  . Smoking status: Current Every Day Smoker    Packs/day: 1.00    Years: 30.00     Pack years: 30.00  . Smokeless tobacco: Never Used  Vaping Use  . Vaping Use: Never used  Substance and Sexual Activity  . Alcohol use: Yes    Alcohol/week: 24.0 standard drinks    Types: 24 Cans of beer per week    Comment: 6 cans of beer per day after getting off work  . Drug use: Never  . Sexual activity: Not Currently  Other Topics Concern  . Not on file  Social History Narrative  . Not on file   Social Determinants of Health   Financial Resource Strain: Not on file  Food Insecurity: Not on file  Transportation Needs: Not on file  Physical Activity: Not on file  Stress: Not on file  Social Connections: Not on file  Intimate Partner Violence: Not on file     No Known Allergies   Outpatient Medications Prior to Visit  Medication Sig Dispense Refill  . citalopram (CELEXA) 20 MG tablet Take 1 tablet (20 mg total) by mouth daily. 30 tablet 3  . folic acid (FOLVITE) 1 MG tablet Take 1 tablet (1 mg total) by mouth daily. 60 tablet 1  . omeprazole (PRILOSEC) 40 MG capsule Take 1 capsule (40 mg total) by mouth 2 (two) times daily before a meal. 60 capsule 6  . ondansetron (ZOFRAN ODT) 8 MG disintegrating tablet Take 1 tablet (8 mg total) by mouth every 8 (eight) hours as needed for nausea or vomiting. 30 tablet 0  . thiamine 100 MG tablet Take 1 tablet (100 mg total) by mouth daily. 60 tablet 3   No facility-administered medications prior to visit.      Review of Systems  Constitutional: Positive for appetite change and fatigue.  HENT: Negative.   Eyes: Negative.   Respiratory: Negative.   Cardiovascular: Negative.   Gastrointestinal: Positive for abdominal distention, abdominal pain, diarrhea, nausea and vomiting. Negative for anal bleeding, blood in stool, constipation and rectal pain.  Endocrine: Negative.   Genitourinary: Negative.   Musculoskeletal: Positive for back pain.       Left hip pain  Skin: Negative.   Neurological: Positive for dizziness, tremors,  light-headedness and headaches. Negative for seizures and syncope.  Hematological: Negative.   Psychiatric/Behavioral: Positive for behavioral problems, dysphoric mood and sleep disturbance. Negative for self-injury and suicidal ideas.  Objective:   Physical Exam There were no vitals filed for this visit.  Gen: Ill-appearing uncomfortable , in no distress,  normal affect  ENT: No lesions,  mouth clear,  oropharynx clear, no postnasal drip  Neck: No JVD, no TMG, no carotid bruits  Lungs: No use of accessory muscles, no dullness to percussion, clear without rales or rhonchi  Cardiovascular: RRR, heart sounds normal, no murmur or gallops, no peripheral edema  Abdomen: Bowel sounds active, severe tenderness in the epigastric area and in the mid abdomen and also tenderness in the left and right mid quadrant areas tender as well in the left and right flanks posteriorly, there is guarding but no rebound  Musculoskeletal: No deformities, no cyanosis or clubbing  Neuro: alert, non focal  Skin: Warm, no lesions or rashes  CT Abdomen 10/20/19:    IMPRESSION: 1. Multilobulated cystic process either within or adjacent to the pancreatic head measuring 3.7 x 2.4 cm. There is mild adjacent edema/peripancreatic fat stranding. Findings are suspicious for acute pancreatitis with pseudocyst. Cystic lesion is age indeterminate with no prior abdominal imaging available. Recommend correlation with pancreatic enzymes and history of prior pancreatitis. Recommend further evaluation with pancreatic protocol MRI when patient is able to tolerate breath hold technique to exclude cystic pancreatic mass. 2. Small fat containing umbilical and supraumbilical ventral abdominal wall hernias without bowel involvement or inflammation. 3. Hepatic steatosis. 4. Mildly prominent prostate gland with mild nodular mass effect on the bladder base. 5. 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  Aortic Atherosclerosis (ICD10-I70.0).  MRI Abdomen10/14/21 IMPRESSION: 1. Previously seen cystic lesion along the pancreaticoduodenal groove is no longer well seen, and appears to have resolved. There is indistinctness of tissue planes in this vicinity suspicious for low-grade groove pancreatitis or less likely duodenitis. No definite mass lesion in this vicinity. The dorsal pancreatic duct connects to both the minor and the major papilla. 2. Marked diffuse hepatic steatosis. 3. Lumbar spondylosis and degenerative disc disease at L4-5 and L5-S1. 4. Given the edema along the descending duodenum, I cannot exclude duodenitis, although groove pancreatitis is favored.    Assessment & Plan:  I personally reviewed all images and lab data in the [facility] system as well as any outside material available during this office visit and agree with the  radiology impressions.   No problem-specific Assessment & Plan notes found for this encounter.   There are no diagnoses linked to this encounter.

## 2020-02-29 ENCOUNTER — Ambulatory Visit: Payer: 59 | Admitting: Critical Care Medicine

## 2020-03-03 ENCOUNTER — Ambulatory Visit (INDEPENDENT_AMBULATORY_CARE_PROVIDER_SITE_OTHER): Payer: 59 | Admitting: Nurse Practitioner

## 2020-03-03 ENCOUNTER — Encounter: Payer: Self-pay | Admitting: Nurse Practitioner

## 2020-03-03 VITALS — BP 130/80 | HR 92 | Ht 69.0 in | Wt 185.0 lb

## 2020-03-03 DIAGNOSIS — R112 Nausea with vomiting, unspecified: Secondary | ICD-10-CM | POA: Diagnosis not present

## 2020-03-03 DIAGNOSIS — R1084 Generalized abdominal pain: Secondary | ICD-10-CM

## 2020-03-03 DIAGNOSIS — A048 Other specified bacterial intestinal infections: Secondary | ICD-10-CM | POA: Diagnosis not present

## 2020-03-03 MED ORDER — DOXYCYCLINE MONOHYDRATE 100 MG PO TABS: 100.0000 mg | ORAL_TABLET | Freq: Two times a day (BID) | ORAL | 0 refills | Status: AC

## 2020-03-03 MED ORDER — METRONIDAZOLE 500 MG PO TABS: 500.0000 mg | ORAL_TABLET | Freq: Four times a day (QID) | ORAL | 0 refills | Status: AC

## 2020-03-03 MED ORDER — BISMUTH SUBSALICYLATE 262 MG PO CHEW: 524.0000 mg | CHEWABLE_TABLET | Freq: Four times a day (QID) | ORAL | 0 refills | Status: AC

## 2020-03-03 NOTE — Progress Notes (Signed)
ASSESSMENT AND PLAN    # 55 yo homeless male here for follow up of pancreatitis. He didn't come for his last two scheduled visits. He had acute pancreatitis with associated cystic lesion on CT scan in last August. 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 Patient complains of nausea / vomiting and generalized abdominal pain > across LOWER abdomen. He is aggravated about being in pain. He has heard about a pain clinic who prescribes oxycodone and plans to go there for pain medication. Marijuana helps the pain but he has a hard time affording it. PCP gave him Zofran which helps with the nausea. He was prescribed Creon a couple of months ago but said it didn't help the pain and was not affordable.  --Unclear why he is having persistent abdominal pain, especially in the lower abdomen. MRI in October showed improvement, possibly even resolution of pancreatitis and associated fluid collection.  He doesn't seem to be constipated. No urinary complaints.  Except for last Wed he has stopped drinking Etoh. The shelter does breathalyzer tests . I'm glad he is no longer drinking. Even when no longer staying at the shelter he will need to avoid Etoh given hx of pancreatitis.   # History of H. pylori gastritis  / duodenitis April 2021. Not yet treated.   --He takes BID Omeprazole which he says controls any heartburn symtoms.  --Will treat with Quadripole therapy, I think this is the most cost effective.  --Will arrange for follow up H.pylori stool ag testing after treatment and off PPI for two weeks  # Hx of IDA. EGD for anemia evaluation was remarkable for gastroduodenitis, multiple non-bleeding duodenal AVMs treated with APC. He does need a colonoscopy at some point. We did not schedule a screening colonoscopy at time of last visit, felt it would be a challenge given patient's homeless status. He is still living at a  shelter, requires bus for transportation. I still think it would be difficult for him to have this done right now. Most recent hgb on 12/28/19 was stable at 11.4.  No colonic masses or colon wall thickening on CT scan in Aug 2021     HISTORY OF PRESENT ILLNESS     Primary Gastroenterologist : Samuel Locks, MD  Chief Complaint : abdominal pain   Samuel Blevins is a 55 y.o. male with PMH / PSH significant for,  but not necessarily limited to: IDA, intestinal AVMs, H.pylori gastritis, Etoh abuse, hepatic steatosis, HTN, tobacco abuse  Patient was last in the office 11/29/2019 for evaluation of abdominal pain and abnormal CT scan of the abdomen and pelvis done in ED late August. CT remarkable for a multiloculated cystic process within or adjacent to the pancreatic head suspicious for pancreatitis with developing pseudocyst versus cystic neoplasm.  We scheduled the patient for an MRI, advised him to discontinue alcohol.  MRI 12/08/2019 showed resolution of the cystic lesion but suspicion for low-grade groove pancreatitis or  Patient says he has cut back on Etoh intake. Says he isn't really drinking Etoh except for last Wed. He has to take a breathizer to go into the shelter.   Patient did not show for his last 2 follow-up appointments. Patient is aggravated because he is having right hip pain which he believes is from heavy lifting ( shingles and concrete)    Data Reviewed:  Previous Endoscopic Evaluations / Pertinent Studies:   06/23/19 EGD  for anemia and GI bleed -Normal esophagus -Gastritis. Biopsied. 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 Treated with argon plasma coagulation (APC). - Mucosal changes in the duodenum. Biopsied.  DUODENUM, BIOPSY:  - Duodenal mucosa with peptic injury.  - No adenomatous change or carcinoma.   B. STOMACH, BIOPSY:  - Mildly active Helicobacter pylori gastritis.  - Warthin-Starry shows rare organisms consistent with Helicobacter  pylori.  - No intestinal  metaplasia, dysplasia or carcinoma.    Past Medical History:  Diagnosis Date  . Alcohol withdrawal syndrome with complication (HCC)   . Elevated AST (SGOT)   . Hypertension   . Symptomatic anemia 11/23/2015    Current Medications, Allergies, Past Surgical History, Family History and Social History were reviewed in [employer] record.   Current Outpatient Medications  Medication Sig Dispense Refill  . citalopram (CELEXA) 20 MG tablet Take 1 tablet (20 mg total) by mouth daily. 30 tablet 3  . folic acid (FOLVITE) 1 MG tablet Take 1 tablet (1 mg total) by mouth daily. 60 tablet 1  . omeprazole (PRILOSEC) 40 MG capsule Take 1 capsule (40 mg total) by mouth 2 (two) times daily before a meal. 60 capsule 6  . ondansetron (ZOFRAN ODT) 8 MG disintegrating tablet Take 1 tablet (8 mg total) by mouth every 8 (eight) hours as needed for nausea or vomiting. 30 tablet 0  . thiamine 100 MG tablet Take 1 tablet (100 mg total) by mouth daily. 60 tablet 3   No current facility-administered medications for this visit.    Review of Systems: No chest pain. No shortness of breath. No urinary complaints.   PHYSICAL EXAM :    Wt Readings from Last 3 Encounters:  12/28/19 170 lb 6.4 oz (77.3 kg)  11/29/19 175 lb (79.4 kg)  10/28/19 180 lb (81.6 kg)    BP 130/80   Pulse 92   Ht 5\' 9"  (1.753 m)   Wt 185 lb (83.9 kg)   BMI 27.32 kg/m  Constitutional:  Well developed male in no acute distress. Psychiatric: agitated, poor eye contact.  EENT: Pupils normal.  Conjunctivae are normal. No scleral icterus. Neck supple.  Cardiovascular: Normal rate, regular rhythm. No edema Pulmonary/chest: Effort normal and breath sounds normal. No wheezing, rales or rhonchi. Abdominal: Difficult exam, he keep putting his hands over abdomen making it difficult for me to examine him.  Neurological: Alert and oriented to person place and time. Skin: Skin is warm and dry. No rashes noted.  , NP  03/03/2020, 8:40 AM

## 2020-03-03 NOTE — Patient Instructions (Addendum)
If you are age 56 or older, your body mass index should be between 23-30. Your Body mass index is 27.32 kg/m. If this is out of the aforementioned range listed, please consider follow up with your Primary Care Provider.  If you are age 42 or younger, your body mass index should be between 19-25. Your Body mass index is 27.32 kg/m. If this is out of the aformentioned range listed, please consider follow up with your Primary Care Provider.   Your provider has requested that you go to the basement level. Press "B" on the elevator. The lab is located at the first door on the left as you exit the elevator. You will do your labs after you have completed your H. Pylori treatment and have been off Omeprazole for 2 weeks.  We will be sending in prescriptions for your H.pylori treatment to [facility] pharmacy.  Thank you for entrusting me with your care and choosing [facility].  Willette Cluster, NP-C

## 2020-03-08 NOTE — Progress Notes (Signed)
Agree with the assessment and plan as outlined by Willette Cluster, NP.  Source of ongoing pain not entirely clear given work-up so far.  Evaluate for improvement with H. pylori treatment and continue medical management as outlined.  If concern for continued pancreatic source of pain, will discuss further with advanced biliary service vs referral to academic center.  Annaliz Aven, DO, [facility]

## 2020-04-05 ENCOUNTER — Other Ambulatory Visit: Payer: Self-pay | Admitting: Critical Care Medicine

## 2020-04-05 ENCOUNTER — Encounter: Payer: Self-pay | Admitting: Critical Care Medicine

## 2020-04-05 DIAGNOSIS — G8929 Other chronic pain: Secondary | ICD-10-CM

## 2020-04-05 DIAGNOSIS — M545 Low back pain, unspecified: Secondary | ICD-10-CM

## 2020-04-05 DIAGNOSIS — M25552 Pain in left hip: Secondary | ICD-10-CM

## 2020-04-06 NOTE — Progress Notes (Signed)
Patient ID: Samuel Blevins, male   DOB: 02/15/1966, 55 y.o.   MRN: [id number] This is a 55 year old male seen in the [place] [facility] of seen him previously and also at the main clinic  Patient states his abdominal pain is now resolved he had received a course of intensive therapy from gastroenterology including quadruple therapy for H. pylori.  He is still drinking about once or twice a week an airplane bottle of hard liquor I suspect is probably more than that then he is reporting  His biggest complaint today is left hip and low back pain which she is complained of in the past.  Initially was seeking opiates we told we would not be prescribing that at this visit  On exam there is tenderness in the left hip and in the lower spine area abdomen is improved  Plan to be obtain x-rays of the lumbar spine pelvic and left hip  We gave the patient a lidocaine patch box over-the-counter from her over-the-counter supply we also gave him some Tylenol to take we did not give ibuprofen because of the previous gastritis issues

## 2020-04-19 ENCOUNTER — Emergency Department (HOSPITAL_COMMUNITY)
Admission: EM | Admit: 2020-04-19 | Discharge: 2020-04-19 | Disposition: A | Discharge status: Home / Self Care | Payer: Self-pay | Attending: Emergency Medicine | Admitting: Emergency Medicine

## 2020-04-19 ENCOUNTER — Encounter (HOSPITAL_COMMUNITY): Payer: Self-pay | Admitting: Emergency Medicine

## 2020-04-19 ENCOUNTER — Other Ambulatory Visit: Payer: Self-pay

## 2020-04-19 ENCOUNTER — Emergency Department (HOSPITAL_COMMUNITY): Payer: Self-pay

## 2020-04-19 DIAGNOSIS — S0003XA Contusion of scalp, initial encounter: Secondary | ICD-10-CM | POA: Insufficient documentation

## 2020-04-19 DIAGNOSIS — M542 Cervicalgia: Secondary | ICD-10-CM | POA: Insufficient documentation

## 2020-04-19 DIAGNOSIS — I10 Essential (primary) hypertension: Secondary | ICD-10-CM | POA: Insufficient documentation

## 2020-04-19 DIAGNOSIS — F17200 Nicotine dependence, unspecified, uncomplicated: Secondary | ICD-10-CM | POA: Insufficient documentation

## 2020-04-19 HISTORY — DX: Homelessness unspecified: Z59.00

## 2020-04-19 HISTORY — DX: Alcohol dependence, uncomplicated: F10.20

## 2020-04-19 LAB — CBC WITH DIFFERENTIAL/PLATELET
Abs Immature Granulocytes: 0.02 10*3/uL (ref 0.00–0.07)
Basophils Absolute: 0 10*3/uL (ref 0.0–0.1)
Basophils Relative: 1 %
Eosinophils Absolute: 0 10*3/uL (ref 0.0–0.5)
Eosinophils Relative: 1 %
HCT: 30.6 % — ABNORMAL LOW (ref 39.0–52.0)
Hemoglobin: 9.4 g/dL — ABNORMAL LOW (ref 13.0–17.0)
Immature Granulocytes: 1 %
Lymphocytes Relative: 32 %
Lymphs Abs: 1.3 10*3/uL (ref 0.7–4.0)
MCH: 24.8 pg — ABNORMAL LOW (ref 26.0–34.0)
MCHC: 30.7 g/dL (ref 30.0–36.0)
MCV: 80.7 fL (ref 80.0–100.0)
Monocytes Absolute: 0.4 10*3/uL (ref 0.1–1.0)
Monocytes Relative: 10 %
Neutro Abs: 2.2 10*3/uL (ref 1.7–7.7)
Neutrophils Relative %: 55 %
Platelets: 151 10*3/uL (ref 150–400)
RBC: 3.79 MIL/uL — ABNORMAL LOW (ref 4.22–5.81)
RDW: 18 % — ABNORMAL HIGH (ref 11.5–15.5)
WBC: 3.9 10*3/uL — ABNORMAL LOW (ref 4.0–10.5)
nRBC: 0 % (ref 0.0–0.2)

## 2020-04-19 LAB — COMPREHENSIVE METABOLIC PANEL
ALT: 70 U/L — ABNORMAL HIGH (ref 0–44)
AST: 144 U/L — ABNORMAL HIGH (ref 15–41)
Albumin: 4 g/dL (ref 3.5–5.0)
Alkaline Phosphatase: 75 U/L (ref 38–126)
Anion gap: 15 (ref 5–15)
BUN: 8 mg/dL (ref 6–20)
CO2: 22 mmol/L (ref 22–32)
Calcium: 8.9 mg/dL (ref 8.9–10.3)
Chloride: 101 mmol/L (ref 98–111)
Creatinine, Ser: 0.7 mg/dL (ref 0.61–1.24)
GFR, Estimated: >60 mL/min (ref >60)
Glucose, Bld: 90 mg/dL (ref 70–99)
Potassium: 3.4 mmol/L — ABNORMAL LOW (ref 3.5–5.1)
Sodium: 138 mmol/L (ref 135–145)
Total Bilirubin: 0.8 mg/dL (ref 0.3–1.2)
Total Protein: 8.2 g/dL — ABNORMAL HIGH (ref 6.5–8.1)

## 2020-04-19 LAB — ETHANOL: Alcohol, Ethyl (B): 301 mg/dL (ref <10)

## 2020-04-19 MED ORDER — ACETAMINOPHEN 500 MG PO TABS
500.0000 mg | ORAL_TABLET | Freq: Once | ORAL | Status: AC
  Administered 2020-04-19: 500 mg via ORAL
  Filled 2020-04-19: qty 1

## 2020-04-19 NOTE — Discharge Instructions (Addendum)
Please follow-up with your primary doctor.  Recommend staying away from alcohol.  Return to ER if you develop chest pain, difficulty breathing, vomiting, severe headache or other new concerning symptoms.

## 2020-04-19 NOTE — ED Notes (Signed)
EDP at bedside  
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
## 2020-04-19 NOTE — ED Notes (Signed)
Patient transported to CT scan . 

## 2020-04-20 ENCOUNTER — Encounter (HOSPITAL_COMMUNITY): Payer: Self-pay

## 2020-04-20 ENCOUNTER — Emergency Department (HOSPITAL_COMMUNITY)
Admission: EM | Admit: 2020-04-20 | Discharge: 2020-04-20 | Disposition: A | Discharge status: Home / Self Care | Payer: Self-pay | Attending: Emergency Medicine | Admitting: Emergency Medicine

## 2020-04-20 DIAGNOSIS — F17210 Nicotine dependence, cigarettes, uncomplicated: Secondary | ICD-10-CM | POA: Insufficient documentation

## 2020-04-20 DIAGNOSIS — M542 Cervicalgia: Secondary | ICD-10-CM | POA: Insufficient documentation

## 2020-04-20 DIAGNOSIS — I10 Essential (primary) hypertension: Secondary | ICD-10-CM | POA: Insufficient documentation

## 2020-04-20 DIAGNOSIS — Z79899 Other long term (current) drug therapy: Secondary | ICD-10-CM | POA: Insufficient documentation

## 2020-04-20 DIAGNOSIS — G44319 Acute post-traumatic headache, not intractable: Secondary | ICD-10-CM | POA: Insufficient documentation

## 2020-04-20 MED ORDER — ACETAMINOPHEN 500 MG PO TABS
500.0000 mg | ORAL_TABLET | Freq: Once | ORAL | Status: AC
  Administered 2020-04-20: 500 mg via ORAL
  Filled 2020-04-20: qty 1
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
## 2020-04-20 NOTE — Discharge Instructions (Addendum)
You may use over-the-counter Motrin (Ibuprofen), Acetaminophen (Tylenol), topical muscle creams such as SalonPas, Icy Hot, Bengay, etc. Please stretch, apply ice or heat (whichever helps), and have massage therapy for additional assistance.  

## 2020-05-03 ENCOUNTER — Other Ambulatory Visit: Payer: Self-pay | Admitting: Critical Care Medicine

## 2020-05-03 ENCOUNTER — Encounter: Payer: Self-pay | Admitting: *Deleted

## 2020-05-03 ENCOUNTER — Encounter: Payer: Self-pay | Admitting: Critical Care Medicine

## 2020-05-03 ENCOUNTER — Telehealth: Payer: Self-pay | Admitting: *Deleted

## 2020-05-03 MED ORDER — THIAMINE HCL 100 MG PO TABS: 100.0000 mg | ORAL_TABLET | Freq: Every day | ORAL | 3 refills | Status: DC

## 2020-05-03 MED ORDER — FOLIC ACID 1 MG PO TABS
1.0000 mg | ORAL_TABLET | Freq: Every day | ORAL | 1 refills | Status: DC
Start: 2020-05-03 — End: 2020-12-12

## 2020-05-03 MED ORDER — ONDANSETRON 8 MG PO TBDP
8.0000 mg | ORAL_TABLET | Freq: Three times a day (TID) | ORAL | 0 refills | Status: DC | PRN
Start: 2020-05-03 — End: 2020-12-12

## 2020-05-03 NOTE — Congregational Nurse Program (Signed)
  Dept: [phone]   Congregational Nurse Program Note  Date of Encounter: 05/03/2020  Past Medical History: Past Medical History:  Diagnosis Date  . Alcohol withdrawal syndrome with complication (HCC)   . Alcoholism (HCC)   . Elevated AST (SGOT)   . Homeless   . Hypertension   . Symptomatic anemia 11/23/2015    Encounter Details:  CNP Questionnaire - 05/03/20 1246      Questionnaire   Do you give verbal consent to treat you today? No
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
## 2020-05-09 ENCOUNTER — Ambulatory Visit: Payer: Self-pay | Admitting: Critical Care Medicine

## 2020-05-09 NOTE — Progress Notes (Deleted)
Subjective:    Patient ID: Samuel Blevins, male    DOB: 07-07-1965, 55 y.o.   MRN: [id number]  This is a 55 year old male who is seen in the mobile medicine clinic today October 28, 2019  Patient has longstanding history of alcohol use and actually I had been seeing him previously at the [facility] clinic.  He also has been seen at the [facility] by nurse practitioner at that site but can no longer go there as he now has [employer]   This patient is had a long standing history of alcohol use, essential hypertension, AVM of the small bowel, prior history of lobar pneumonia, gastritis and gastroduodenitis.  The patient was admitted actually August 13 to the behavioral health unit for alcohol intoxication and detox at the [facility].  Below is documentation from the discharge summary of that admission  Samuel Blevins is a 55 y.o. male who has long history of alcohol use disorder severe and presents to the ED requesting detox. Patient states that he drinks approximately 2-3 fifths per day and drinking daily. Patient was intoxicated on his arrival with the BAL was 434 on his admission. Per notes patient pulled out mini bottle of gin and drank it while he was in triage. His BAL was 434 on his admission. Patient states that extensive history of chronic alcohol abuse and increased his drinking lately. Patient states to several detoxes in the past and his last detox was 10 years ago and since then drinking daily basis. Patient denies history of withdrawal seizure or DTs. He denies other illicit drug usage. His UDS was negative. Patient denies suicidal or homicidal ideation. He denies hallucinations or psychosis. Patient admitted to inpatient behavioral health unit for alcohol detox with Librium.  During the course of the hospitalization patient is irritable, verbally abusive to staff and has poor insight. He did not complete his detox and is demanding discharge today.  Educated on need to complete detox but patient remains adamant. Educated on risk of seizures and worsening withdrawal symptoms to no avail. Patient is denying any acute withdrawal symptoms at present but is demanding Rx for Librium. Educated patient that this would not be provided and he would have to stay inpatient to complete detox. Patient was angry and verbally aggressive with this provider. He was not forthcoming and was minimally responsive to questions. He is minimally invested in treatment and has poor insight. He denies any mood disorders at present and is denying any SI, HI or AVH. After multiple attempts at convincing patient to complete detox and patients continued insistence for discharge he was sent home. He was given outpatient referral to [facility].   At time of discharge, patient is linear, direct, and organized; denies SI, HI, and AVH and is not currently significantly impaired, psychotic, or manic on exam. Detailed risk assessment is complete based on clinical exam and individual risk factors and acute suicide risk is low and acute violence risk is low. Individualized risk factors include: Addition and individualized protective factors include: positive family connectedness, no access to firearms, future oriented and participating in treatment plan. At this time, protective factors outweigh risk factors. Safety plan is created jointly which involves the patient following up with [facility] for med management and outpatient services. At this time, patient is educated and verbalized understanding of mental health resources and other crisis services in the community. They are instructed to call 911 and present to the nearest emergency room  should they experience any SI/HI/AVH or detrimental worsening of their mental health condition.  Regarding psychotropic medication treatment, the above psychotropic medications were prescribed and adjustment were made as described below (Medication  changes during this hospitalization). Physician(s) and team provided education to the client on the risk and benefits of treatment options, instructions for follow-up, and importance of adherence to chosen treatment. Medication side effects, possible drug interactions between the prescription meds and their interaction with recreational substances was discussed with the patient. No intolerant adverse side effects, were reported or noted, on day of discharge. Writer counseled the patient on ill effect of using recreational drugs and underscored the need for sobriety.  On , following sustained improvement in the affect of this patient, continued report of euthymic mood, repeated denial of suicidal, homicidal, and other violent ideation, adequate interaction with peers, active participation in groups while on the unit, and denial of adverse reactions from medications, the treatment team decided Samuel Blevins was stable for discharge home with scheduled mental health treatment as noted below.   The patient was given Librium and a tapering fashion and he apparently is now off this medication.  After discharge the patient noted increased back pain and abdominal pain and presented to the [facility], ER August 25.  Below is documentation from that visit  55 year old male with a history of hypertension, alcohol abuse, AVM of the small bowel, presents to the ER with 2 days of abdominal pain.  Patient reports a history of an abdominal hernia, which he thinks "busted".  He stated he felt a sudden onset of pain around his pelvic is approximately 2 days ago.  Has reported some episodes of nonbloody nonbilious vomiting.  Denies any difficulty with bowel movements or urination, denies any blood in his stool.  He also notes that he feels like his abdomen is swollen.  Denies any fevers, endorses some chills at night.  States the pain prevents him from sleeping well.  He has not taken anything for his pain.                           55 year old male resents with abdominal pain x2 days On presentation, he is alert, oriented, nontoxic-appearing, no acute distress.  Vitals overall reassuring.  Physical exam with periumbilical tenderness.  CBC without leukocytosis, hemoglobin of 10.5, improved from 8 3 months ago.  Lipase normal.  CMP with mild hypokalemia, received supplement in the ED and will send home with some as well.  Milld hypocalcemia of 8.5. No significant EKG changes. UA without evidence of UTI, he has some proteinurea of 30. CT of the abdomen with new multilobulated cystic process and a small fat-containing umbilical and supraumbilical ventral hernia without bowel involvement or inflammation.  Patient received 4 mg of morphine here in the ED.  He still has some pain, however there is no acute indication for surgery or admission.  Given his history of alcoholism, will hold opioid prescription.  Patient encouraged to take Tylenol or ibuprofen for pain.  Will refer to gastroenterology for the cyst and general surgery for the hernia.  Ambulatory referral was also placed as I question if the patient will follow up.  Return precautions discussed.  He voiced understanding and is agreeable.  At this stage in the ED course, the patient is medically screened and stable for discharge.  CT scan showed necrosis the pancreas with acute pancreatitis changes and pancreatic cyst formation all likely due to alcohol  use previously  The patient was referred to general surgery and gastroenterology for follow-up but the earliest appointments were early October  There is an umbilical hernia with supraumbilical ventral hernia without bowel involvement this is the reason for the surgery referral  Gastroenterology was to see the patient for the pancreatic cyst and pancreatitis  The patient was given Tylenol and ibuprofen for pain however this is not relieved the pain and is progressed to the point where he comes to the mobile unit  requesting additional attention  The patient has had nausea with this and some vomiting.  He has been off alcohol now since he was hospitalized for the detoxification.  He has sharp pain in his lower back as well in both flanks.  He has left hip pain as well.  He denies any fever.  12/28/2019 This patient is seen today in the clinic for the first time previously been seen in the mobile medicine unit and [facility]  The patient has had a recent MRI of the abdomen which has revealed resolution of the pancreatic cyst however he has pancreas divisum and also pancreatitis ongoing.  Also concerns for duodenitis and gastritis.  He is H. pylori positive.  He is just finishing now 2-week course of Biaxin and Flagyl and is on high-dose proton pump inhibitor.  He is still having episodes of vomiting and some loose stools.  He did have Creon added to his care plan.  He has an upcoming appointment this week with gastroenterology is encouraged to keep.  He still drinking hard liquor every few days.  He is having increased forgetfulness and balance issues.  On arrival blood pressure was 145/93.  The patient is on the oxycodone 10 mg every 6 hours as needed for pain.  He is here to get a pain contract established and a urine drug screen.  He scores very high today on his PHQ-9 for depression.  He is remains in the shelter and is attempting apply for disability as he is not able to work.  His only care gap is that he needs a colonoscopy which will have to be deferred for now.  05/09/2020   Past Medical History:  Diagnosis Date  . Alcohol withdrawal syndrome with complication (HCC)   . Alcoholism (HCC)   . Elevated AST (SGOT)   . Homeless   . Hypertension   . Symptomatic anemia 11/23/2015     Family History  Problem Relation Age of Onset  . Diabetes Mellitus II Neg Hx   . Colon cancer Neg Hx   . Stomach cancer Neg Hx   . Pancreatic cancer Neg Hx      Social History    Socioeconomic History  . Marital status: Single    Spouse name: Not on file  . Number of children: Not on file  . Years of education: Not on file  . Highest education level: Not on file  Occupational History  . Not on file  Tobacco Use  . Smoking status: Current Every Day Smoker    Packs/day: 1.00    Years: 30.00    Pack years: 30.00  . Smokeless tobacco: Never Used  Vaping Use  . Vaping Use: Never used  Substance and Sexual Activity  . Alcohol use: Yes  . Drug use: Never  . Sexual activity: Not Currently  Other Topics Concern  . Not on file  Social History Narrative  . Not on file   Social Determinants of Health  Financial Resource Strain: Not on file  Food Insecurity: Not on file  Transportation Needs: Not on file  Physical Activity: Not on file  Stress: Not on file  Social Connections: Not on file  Intimate Partner Violence: Not on file     No Known Allergies   Outpatient Medications Prior to Visit  Medication Sig Dispense Refill  . citalopram (CELEXA) 20 MG tablet Take 1 tablet (20 mg total) by mouth daily. 30 tablet 3  . folic acid (FOLVITE) 1 MG tablet Take 1 tablet (1 mg total) by mouth daily. 60 tablet 1  . omeprazole (PRILOSEC) 40 MG capsule Take 1 capsule (40 mg total) by mouth 2 (two) times daily before a meal. 60 capsule 6  . ondansetron (ZOFRAN ODT) 8 MG disintegrating tablet Take 1 tablet (8 mg total) by mouth every 8 (eight) hours as needed for nausea or vomiting. 30 tablet 0  . thiamine 100 MG tablet Take 1 tablet (100 mg total) by mouth daily. 60 tablet 3   No facility-administered medications prior to visit.      Review of Systems  Constitutional: Positive for appetite change and fatigue.  HENT: Negative.   Eyes: Negative.   Respiratory: Negative.   Cardiovascular: Negative.   Gastrointestinal: Positive for abdominal distention, abdominal pain, diarrhea, nausea and vomiting. Negative for anal bleeding, blood in stool, constipation and  rectal pain.  Endocrine: Negative.   Genitourinary: Negative.   Musculoskeletal: Positive for back pain.       Left hip pain  Skin: Negative.   Neurological: Positive for dizziness, tremors, light-headedness and headaches. Negative for seizures and syncope.  Hematological: Negative.   Psychiatric/Behavioral: Positive for behavioral problems, dysphoric mood and sleep disturbance. Negative for self-injury and suicidal ideas.       Objective:   Physical Exam There were no vitals filed for this visit.  Gen: Ill-appearing uncomfortable , in no distress,  normal affect  ENT: No lesions,  mouth clear,  oropharynx clear, no postnasal drip  Neck: No JVD, no TMG, no carotid bruits  Lungs: No use of accessory muscles, no dullness to percussion, clear without rales or rhonchi  Cardiovascular: RRR, heart sounds normal, no murmur or gallops, no peripheral edema  Abdomen: Bowel sounds active, severe tenderness in the epigastric area and in the mid abdomen and also tenderness in the left and right mid quadrant areas tender as well in the left and right flanks posteriorly, there is guarding but no rebound  Musculoskeletal: No deformities, no cyanosis or clubbing  Neuro: alert, non focal  Skin: Warm, no lesions or rashes  CT Abdomen 10/20/19:    IMPRESSION: 1. Multilobulated cystic process either within or adjacent to the pancreatic head measuring 3.7 x 2.4 cm. There is mild adjacent edema/peripancreatic fat stranding. Findings are suspicious for acute pancreatitis with pseudocyst. Cystic lesion is age indeterminate with no prior abdominal imaging available. Recommend correlation with pancreatic enzymes and history of prior pancreatitis. Recommend further evaluation with pancreatic protocol MRI when patient is able to tolerate breath hold technique to exclude cystic pancreatic mass. 2. Small fat containing umbilical and supraumbilical ventral abdominal wall hernias without bowel  involvement or inflammation. 3. Hepatic steatosis. 4. Mildly prominent prostate gland with mild nodular mass effect on the bladder base. 5. 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  Aortic Atherosclerosis (ICD10-I70.0).  MRI Abdomen10/14/21 IMPRESSION: 1. Previously seen cystic lesion along the pancreaticoduodenal groove is no longer well seen, and appears to have resolved. There is indistinctness of  tissue planes in this vicinity suspicious for low-grade groove pancreatitis or less likely duodenitis. No definite mass lesion in this vicinity. The dorsal pancreatic duct connects to both the minor and the major papilla. 2. Marked diffuse hepatic steatosis. 3. Lumbar spondylosis and degenerative disc disease at L4-5 and L5-S1. 4. Given the edema along the descending duodenum, I cannot exclude duodenitis, although groove pancreatitis is favored.    Assessment & Plan:  I personally reviewed all images and lab data in the [facility] system as well as any outside material available during this office visit and agree with the  radiology impressions.   No problem-specific Assessment & Plan notes found for this encounter.   There are no diagnoses linked to this encounter.

## 2020-08-26 ENCOUNTER — Other Ambulatory Visit: Payer: Self-pay

## 2020-08-26 ENCOUNTER — Emergency Department (HOSPITAL_COMMUNITY): Payer: Self-pay

## 2020-08-26 ENCOUNTER — Emergency Department (HOSPITAL_COMMUNITY)
Admission: EM | Admit: 2020-08-26 | Discharge: 2020-08-26 | Discharge status: Against Medical Advice | Payer: Self-pay | Attending: Emergency Medicine | Admitting: Emergency Medicine

## 2020-08-26 DIAGNOSIS — I639 Cerebral infarction, unspecified: Secondary | ICD-10-CM | POA: Insufficient documentation

## 2020-08-26 DIAGNOSIS — Y908 Blood alcohol level of 240 mg/100 ml or more: Secondary | ICD-10-CM | POA: Insufficient documentation

## 2020-08-26 DIAGNOSIS — F17210 Nicotine dependence, cigarettes, uncomplicated: Secondary | ICD-10-CM | POA: Insufficient documentation

## 2020-08-26 DIAGNOSIS — R10817 Generalized abdominal tenderness: Secondary | ICD-10-CM | POA: Insufficient documentation

## 2020-08-26 DIAGNOSIS — R531 Weakness: Secondary | ICD-10-CM | POA: Insufficient documentation

## 2020-08-26 DIAGNOSIS — I10 Essential (primary) hypertension: Secondary | ICD-10-CM | POA: Insufficient documentation

## 2020-08-26 DIAGNOSIS — F1010 Alcohol abuse, uncomplicated: Secondary | ICD-10-CM

## 2020-08-26 DIAGNOSIS — R29810 Facial weakness: Secondary | ICD-10-CM | POA: Insufficient documentation

## 2020-08-26 DIAGNOSIS — R4781 Slurred speech: Secondary | ICD-10-CM | POA: Insufficient documentation

## 2020-08-26 DIAGNOSIS — R2990 Unspecified symptoms and signs involving the nervous system: Secondary | ICD-10-CM

## 2020-08-26 LAB — URINALYSIS, ROUTINE W REFLEX MICROSCOPIC
Bilirubin Urine: NEGATIVE
Glucose, UA: NEGATIVE mg/dL
Hgb urine dipstick: NEGATIVE
Ketones, ur: NEGATIVE mg/dL
Leukocytes,Ua: NEGATIVE
Nitrite: NEGATIVE
Protein, ur: NEGATIVE mg/dL
Specific Gravity, Urine: 1.016 (ref 1.005–1.030)
pH: 6 (ref 5.0–8.0)

## 2020-08-26 LAB — DIFFERENTIAL
Abs Immature Granulocytes: 0.01 10*3/uL (ref 0.00–0.07)
Basophils Absolute: 0 10*3/uL (ref 0.0–0.1)
Basophils Relative: 1 %
Eosinophils Absolute: 0 10*3/uL (ref 0.0–0.5)
Eosinophils Relative: 1 %
Immature Granulocytes: 0 %
Lymphocytes Relative: 24 %
Lymphs Abs: 1.2 10*3/uL (ref 0.7–4.0)
Monocytes Absolute: 0.7 10*3/uL (ref 0.1–1.0)
Monocytes Relative: 15 %
Neutro Abs: 2.9 10*3/uL (ref 1.7–7.7)
Neutrophils Relative %: 59 %

## 2020-08-26 LAB — CBC
HCT: 27.6 % — ABNORMAL LOW (ref 39.0–52.0)
Hemoglobin: 8.6 g/dL — ABNORMAL LOW (ref 13.0–17.0)
MCH: 23.7 pg — ABNORMAL LOW (ref 26.0–34.0)
MCHC: 31.2 g/dL (ref 30.0–36.0)
MCV: 76 fL — ABNORMAL LOW (ref 80.0–100.0)
Platelets: 95 10*3/uL — ABNORMAL LOW (ref 150–400)
RBC: 3.63 MIL/uL — ABNORMAL LOW (ref 4.22–5.81)
RDW: 22 % — ABNORMAL HIGH (ref 11.5–15.5)
WBC: 4.9 10*3/uL (ref 4.0–10.5)
nRBC: 0 % (ref 0.0–0.2)

## 2020-08-26 LAB — COMPREHENSIVE METABOLIC PANEL
ALT: 51 U/L — ABNORMAL HIGH (ref 0–44)
AST: 181 U/L — ABNORMAL HIGH (ref 15–41)
Albumin: 3.6 g/dL (ref 3.5–5.0)
Alkaline Phosphatase: 90 U/L (ref 38–126)
Anion gap: 11 (ref 5–15)
BUN: 7 mg/dL (ref 6–20)
CO2: 24 mmol/L (ref 22–32)
Calcium: 8.5 mg/dL — ABNORMAL LOW (ref 8.9–10.3)
Chloride: 100 mmol/L (ref 98–111)
Creatinine, Ser: 0.78 mg/dL (ref 0.61–1.24)
GFR, Estimated: >60 mL/min (ref >60)
Glucose, Bld: 104 mg/dL — ABNORMAL HIGH (ref 70–99)
Potassium: 3.2 mmol/L — ABNORMAL LOW (ref 3.5–5.1)
Sodium: 135 mmol/L (ref 135–145)
Total Bilirubin: 1 mg/dL (ref 0.3–1.2)
Total Protein: 8.2 g/dL — ABNORMAL HIGH (ref 6.5–8.1)

## 2020-08-26 LAB — I-STAT CHEM 8, ED
BUN: 6 mg/dL (ref 6–20)
Calcium, Ion: 0.95 mmol/L — ABNORMAL LOW (ref 1.15–1.40)
Chloride: 100 mmol/L (ref 98–111)
Creatinine, Ser: 1 mg/dL (ref 0.61–1.24)
Glucose, Bld: 102 mg/dL — ABNORMAL HIGH (ref 70–99)
HCT: 34 % — ABNORMAL LOW (ref 39.0–52.0)
Hemoglobin: 11.6 g/dL — ABNORMAL LOW (ref 13.0–17.0)
Potassium: 3.2 mmol/L — ABNORMAL LOW (ref 3.5–5.1)
Sodium: 138 mmol/L (ref 135–145)
TCO2: 23 mmol/L (ref 22–32)

## 2020-08-26 LAB — APTT: aPTT: 31 seconds (ref 24–36)

## 2020-08-26 LAB — PROTIME-INR
INR: 1 (ref 0.8–1.2)
Prothrombin Time: 13.1 seconds (ref 11.4–15.2)

## 2020-08-26 LAB — LIPASE, BLOOD: Lipase: 135 U/L — ABNORMAL HIGH (ref 11–51)

## 2020-08-26 LAB — CBG MONITORING, ED: Glucose-Capillary: 102 mg/dL — ABNORMAL HIGH (ref 70–99)

## 2020-08-26 LAB — MAGNESIUM: Magnesium: 1.6 mg/dL — ABNORMAL LOW (ref 1.7–2.4)

## 2020-08-26 LAB — ETHANOL: Alcohol, Ethyl (B): 457 mg/dL (ref <10)

## 2020-08-26 MED ORDER — SODIUM CHLORIDE 0.9% FLUSH: 3.0000 mL | Freq: Once | INTRAVENOUS | Status: DC

## 2020-08-26 MED ORDER — IOHEXOL 350 MG/ML SOLN
50.0000 mL | Freq: Once | INTRAVENOUS | Status: AC | PRN
  Administered 2020-08-26: 50 mL via INTRAVENOUS

## 2020-08-26 NOTE — ED Triage Notes (Signed)
Patient [place]IB [facility] [place]. Patient family noticed pt with slurred speech and left sided facial droop. Per EMS pt endorses having a couple drinks this morning at 9.

## 2020-08-26 NOTE — ED Provider Notes (Signed)
[place] EMERGENCY DEPARTMENT Provider Note   CSN: [id number] Arrival date & time: 08/26/20  1131  An emergency department physician performed an initial assessment on this suspected stroke patient at 1132.  History Chief Complaint  Patient presents with   Code Stroke    Rayven Hendrickson is a 55 y.o. male.  Pt presents to the ED today as a code stroke.  [place] the bus stop this am when he developed slurred speech and a left sided facial droop.  Pt does admit to having a few drinks, but said that was around 0900.  [place] myself and neurology.      Past Medical History:  Diagnosis Date   Alcohol withdrawal syndrome with complication (HCC)    Alcoholism ([place])    Elevated AST (SGOT)    Homeless    Hypertension    Symptomatic anemia 11/23/2015    Patient Active Problem List   Diagnosis Date Noted   Chronic pain syndrome 12/28/2019   Pancreatic pseudocyst 10/28/2019   Pancreatitis, acute 10/28/2019   Gastritis and gastroduodenitis    AVM (arteriovenous malformation) of small bowel, acquired    Chronic alcoholism ([place]) 06/21/2019   Tobacco abuse 03/17/2014   Essential hypertension 03/17/2014    Past Surgical History:  Procedure Laterality Date   BIOPSY  06/23/2019   Procedure: BIOPSY;  Surgeon: Lavena Bullion, DO;  Location: [facility];  Service: Gastroenterology;;   ESOPHAGOGASTRODUODENOSCOPY (EGD) WITH PROPOFOL N/A 06/23/2019   Procedure: ESOPHAGOGASTRODUODENOSCOPY (EGD) WITH PROPOFOL;  Surgeon: Lavena Bullion, DO;  Location: [facility];  Service: Gastroenterology;  Laterality: N/A;   HOT HEMOSTASIS N/A 06/23/2019   Procedure: HOT HEMOSTASIS (ARGON PLASMA COAGULATION/BICAP);  Surgeon: Lavena Bullion, DO;  Location[facility];  Service: Gastroenterology;  Laterality: N/A;       Family History  Problem Relation Age of Onset   Diabetes Mellitus II Neg Hx    Colon cancer Neg Hx    Stomach cancer Neg Hx     Pancreatic cancer Neg Hx     Social History   Tobacco Use   Smoking status: Every Day    Packs/day: 1.00    Years: 30.00    Pack years: 30.00    Types: Cigarettes   Smokeless tobacco: Never  Vaping Use   Vaping Use: Never used  Substance Use Topics   Alcohol use: Yes   Drug use: Never    Home Medications Prior to Admission medications   Medication Sig Start Date End Date Taking? Authorizing Provider  citalopram (CELEXA) 20 MG tablet TAKE 1 TABLET (20 MG TOTAL) BY MOUTH DAILY. 02/02/20 02/01/21  [name], MD  folic acid (FOLVITE) 1 MG tablet Take 1 tablet (1 mg total) by mouth daily. 05/03/20   [name], MD  omeprazole (PRILOSEC) 40 MG capsule Take 1 capsule (40 mg total) by mouth 2 (two) times daily before a meal. 12/15/19   [name], MD  ondansetron (ZOFRAN ODT) 8 MG disintegrating tablet Take 1 tablet (8 mg total) by mouth every 8 (eight) hours as needed for nausea or vomiting. 05/03/20   [name], MD  thiamine 100 MG tablet Take 1 tablet (100 mg total) by mouth daily. 05/03/20   [name], MD    Allergies    Patient has no known allergies.  Review of Systems   Review of Systems  Neurological:  Positive for speech difficulty and weakness.  All other systems reviewed  and are negative.  Physical Exam Updated Vital Signs BP 137/81   Pulse 96   Resp 11   Wt 79 kg   SpO2 96%   BMI 26.48 kg/m   Physical Exam Vitals and nursing note reviewed.  Constitutional:      Appearance: Normal appearance.  HENT:     Head: Normocephalic and atraumatic.     Right Ear: External ear normal.     Left Ear: External ear normal.     Nose: Nose normal.     Mouth/Throat:     Mouth: Mucous membranes are dry.  Eyes:     Extraocular Movements: Extraocular movements intact.     Conjunctiva/sclera: Conjunctivae normal.     Pupils: Pupils are equal, round, and reactive to light.  Cardiovascular:     Rate and Rhythm: Normal rate and regular rhythm.      Pulses: Normal pulses.     Heart sounds: Normal heart sounds.  Pulmonary:     Effort: Pulmonary effort is normal.     Breath sounds: Normal breath sounds.  Abdominal:     General: Abdomen is flat. Bowel sounds are normal.     Palpations: Abdomen is soft.     Tenderness: There is generalized abdominal tenderness.  Musculoskeletal:        General: Normal range of motion.     Cervical back: Normal range of motion and neck supple.  Skin:    General: Skin is warm.     Capillary Refill: Capillary refill takes less than 2 seconds.  Neurological:     Mental Status: He is alert.     Comments: Pt is slow to respond.  He is weak diffusely.  Psychiatric:        Mood and Affect: Mood normal.        Behavior: Behavior normal.    ED Results / Procedures / Treatments   Labs (all labs ordered are listed, but only abnormal results are displayed) Labs Reviewed  CBC - Abnormal; Notable for the following components:      Result Value   RBC 3.63 (*)    Hemoglobin 8.6 (*)    HCT 27.6 (*)    MCV 76.0 (*)    MCH 23.7 (*)    RDW 22.0 (*)    Platelets 95 (*)    All other components within normal limits  COMPREHENSIVE METABOLIC PANEL - Abnormal; Notable for the following components:   Potassium 3.2 (*)    Glucose, Bld 104 (*)    Calcium 8.5 (*)    Total Protein 8.2 (*)    AST 181 (*)    ALT 51 (*)    All other components within normal limits  I-STAT CHEM 8, ED - Abnormal; Notable for the following components:   Potassium 3.2 (*)    Glucose, Bld 102 (*)    Calcium, Ion 0.95 (*)    Hemoglobin 11.6 (*)    HCT 34.0 (*)    All other components within normal limits  CBG MONITORING, ED - Abnormal; Notable for the following components:   Glucose-Capillary 102 (*)    All other components within normal limits  RESP PANEL BY RT-PCR (FLU A&B, COVID) ARPGX2  PROTIME-INR  APTT  DIFFERENTIAL  ETHANOL  LIPASE, BLOOD  MAGNESIUM  URINALYSIS, ROUTINE W REFLEX MICROSCOPIC  RAPID URINE DRUG SCREEN,  HOSP PERFORMED    EKG None  Radiology CT HEAD CODE STROKE WO CONTRAST  Result Date: 08/26/2020 CLINICAL DATA:  Code stroke. 55 year old male with  left facial droop and slurred speech. EXAM: CT HEAD WITHOUT CONTRAST TECHNIQUE: Contiguous axial images were obtained from the base of the skull through the vertex without intravenous contrast. COMPARISON:  Head CT 04/19/2020. FINDINGS: Brain: Stable cerebral volume since February. No midline shift, ventriculomegaly, mass effect, evidence of mass lesion, intracranial hemorrhage or evidence of cortically based acute infarction. Stable patchy white matter hypodensity is mild and greater on the left. No cortical encephalomalacia. Vascular: Mild Calcified atherosclerosis at the skull base. No suspicious intracranial vascular hyperdensity. Skull: Chronic left lamina papyracea fracture. No acute osseous abnormality identified. Sinuses/Orbits: Visualized paranasal sinuses and mastoids are stable and well aerated. Other: There is rightward gaze. Visualized scalp soft tissues are within normal limits. ASPECTS [facility] Stroke Program Early CT Score) Total score (0-10 with 10 being normal): 10. IMPRESSION: 1. Stable non contrast CT appearance of the brain since February, with mild white matter disease. ASPECTS 10. 2. These results were communicated to Dr. Rory Percy at 11:46 am on 08/26/2020 by text page via the [facility] messaging system. Electronically Signed   By: Genevie Ann M.D.   On: 08/26/2020 11:46   CT ANGIO HEAD NECK W WO CM (CODE STROKE)  Result Date: 08/26/2020 CLINICAL DATA:  Code stroke follow-up, left facial droop EXAM: CT ANGIOGRAPHY HEAD AND NECK TECHNIQUE: Multidetector CT imaging of the head and neck was performed using the standard protocol during bolus administration of intravenous contrast. Multiplanar CT image reconstructions and MIPs were obtained to evaluate the vascular anatomy. Carotid stenosis measurements (when applicable) are obtained utilizing NASCET  criteria, using the distal internal carotid diameter as the denominator. CONTRAST:  49m OMNIPAQUE IOHEXOL 350 MG/ML SOLN COMPARISON:  None. FINDINGS: CTA NECK Aortic arch: Mild calcified plaque at the patent great vessel origins. Right carotid system: Patent. Mixed but primarily noncalcified plaque along the proximal internal carotid causing approximately 50% stenosis. Left carotid system: Patent. Mixed plaque along the proximal internal carotid causing less than 50% stenosis. Vertebral arteries: Patent. Left vertebral artery is dominant. No stenosis. Skeleton: Cervical spine degenerative changes primarily at C3-C4 and C4-C5. Other neck: Unremarkable. Upper chest: No apical lung mass. Review of the MIP images confirms the above findings CTA HEAD Anterior circulation: Intracranial internal carotid arteries are patent. Anterior and middle cerebral arteries are patent. Posterior circulation: Intracranial vertebral arteries are patent. Basilar artery is patent. Major cerebellar artery origins are patent. Bilateral posterior communicating arteries are present. Posterior cerebral arteries are patent. Venous sinuses: Patent as allowed by contrast bolus timing. Review of the MIP images confirms the above findings IMPRESSION: No vessel occlusion in the neck. Plaque at the proximal right ICA causes approximately 50% stenosis. Plaque at the proximal left ICA causes less than 50% stenosis. No proximal intracranial vessel occlusion or significant stenosis. Electronically Signed   By: PMacy MisM.D.   On: 08/26/2020 12:07    Procedures Procedures   Medications Ordered in ED Medications  sodium chloride flush (NS) 0.9 % injection 3 mL (has no administration in time range)  iohexol (OMNIPAQUE) 350 MG/ML injection 50 mL (50 mLs Intravenous Contrast Given 08/26/20 1152)    ED Course  I have reviewed the triage vital signs and the nursing notes.  Pertinent labs & imaging results that were available during my care of  the patient were reviewed by me and considered in my medical decision making (see chart for details).    MDM Rules/Calculators/A&P  Pt evaluated by the stroke team.  He has not had an acute stroke.    Pt refused any other evaluation.  I tried to talk to him about staying.  He said he would, then got up and left.  Pt able to walk without any problems.  He refused to sign any AMA paper work and did not receive any d/c instructions.  Final Clinical Impression(s) / ED Diagnoses Final diagnoses:  Slurred speech    Rx / DC Orders ED Discharge Orders     None        Isla Pence, MD 08/26/20 1311

## 2020-08-26 NOTE — ED Notes (Signed)
Patient refused to sign AMA form

## 2020-08-26 NOTE — ED Notes (Signed)
Patient adamant of leaving. Pt informed of risks, patient stated he did not care, RN attempted to have patient stop to take IV out. Patient refused to stop for RN. [employer] to ambulance bay, was able to have patient come back in and have IV removed. EDP aware of patient leaving AMA.

## 2020-08-26 NOTE — Consult Note (Signed)
Neurology Consultation  Reason for Consult: Code stroke for slurred speech and left facial droop Referring Physician: Dr. Jacalyn Lefevre  CC: Left-sided facial droop and slurred speech-brought in as a code stroke  History is obtained from: Patient, chart  HPI: Samuel Blevins is a 55 y.o. male past medical history of alcoholism, hypertension, homelessness, brought in from a bus stop where he EMS was called to assess him for slurred speech.  Somebody from his family was there with him and noticed that he had a left-sided facial droop and his speech was slurred.  He reports drinking 2 airline liquor bottles of liquor at around 9:00 AM and says that his last known well was somewhere around 8:30 AM.  He also complains of abdominal discomfort and pain that started around the same time as well as left hip pain.  He says he has been having trouble walking with complaints of weakness and pain in both legs. He denies illicit drug use although he has a strong smell of marijuana on his person. Denies a headache at this time.  Denies visual changes.   LKW: 8:30 AM today tpa given?: no, exam more consistent with alcoholic intoxication than stroke Premorbid modified Rankin scale (mRS): 0  ROS:  Unable to obtain due to altered mental status.   Past Medical History:  Diagnosis Date   Alcohol withdrawal syndrome with complication (HCC)    Alcoholism (HCC)    Elevated AST (SGOT)    Homeless    Hypertension    Symptomatic anemia 11/23/2015        Family History  Problem Relation Age of Onset   Diabetes Mellitus II Neg Hx    Colon cancer Neg Hx    Stomach cancer Neg Hx    Pancreatic cancer Neg Hx      Social History:   reports that he has been smoking. He has a 30.00 pack-year smoking history. He has never used smokeless tobacco. He reports current alcohol use. He reports that he does not use drugs.  Medications  Current Facility-Administered Medications:    sodium chloride flush (NS) 0.9 %  injection 3 mL, 3 mL, Intravenous, Once, Jacalyn Lefevre, MD  Current Outpatient Medications:    citalopram (CELEXA) 20 MG tablet, TAKE 1 TABLET (20 MG TOTAL) BY MOUTH DAILY., Disp: 30 tablet, Rfl: 3   folic acid (FOLVITE) 1 MG tablet, Take 1 tablet (1 mg total) by mouth daily., Disp: 60 tablet, Rfl: 1   omeprazole (PRILOSEC) 40 MG capsule, Take 1 capsule (40 mg total) by mouth 2 (two) times daily before a meal., Disp: 60 capsule, Rfl: 6   ondansetron (ZOFRAN ODT) 8 MG disintegrating tablet, Take 1 tablet (8 mg total) by mouth every 8 (eight) hours as needed for nausea or vomiting., Disp: 30 tablet, Rfl: 0   thiamine 100 MG tablet, Take 1 tablet (100 mg total) by mouth daily., Disp: 60 tablet, Rfl: 3   Exam: Current vital signs: BP 137/81   Pulse 96   Resp 11   Wt 79 kg   SpO2 96%   BMI 26.48 kg/m  Vital signs in last 24 hours: Pulse Rate:  [96] 96 (07/02 1200) Resp:  [11] 11 (07/02 1200) BP: (137)/(81) 137/81 (07/02 1200) SpO2:  [96 %] 96 % (07/02 1200) Weight:  [79 kg] 79 kg (07/02 1138) General: Awake alert in no distress.  Poorly groomed.  Strong smell of marijuana from his person. HEENT: Normocephalic, atraumatic, poor oral hygiene and dry mucous membranes CVS: Regular rate  rhythm Respiratory: Breathing well and saturating normally on room air Abdomen with tenderness all over to palpation Neurological exam Is awake alert aware that he is in the hospital Could not tell me the date or time He got his age correct Follows some commands, diminished attention concentration and poor insight His speech is mild to moderately dysarthric There is also question of mild word finding difficulty/delay. Cranial nerves: Pupils equal round reactive to light, extraocular movements appeared unhindered, face appears symmetric to me on rest and on smiling, auditory acuity intact, tongue and palate midline Motor exam with generalized weakness-all 4 extremities fall to the bed within a secondary  tube with upper extremities mildly stronger than the lower extremities. Sensation intact to touch Coordination difficult to assess due to his mentation  Labs I have reviewed labs in epic and the results pertinent to this consultation are:    CBC    Component Value Date/Time   WBC 3.9 (L) 04/19/2020 2111   RBC 3.79 (L) 04/19/2020 2111   HGB 11.6 (L) 08/26/2020 1141   HGB 11.4 (L) 12/28/2019 1057   HCT 34.0 (L) 08/26/2020 1141   HCT 35.6 (L) 12/28/2019 1057   PLT 151 04/19/2020 2111   PLT 292 12/28/2019 1057   MCV 80.7 04/19/2020 2111   MCV 88 12/28/2019 1057   MCH 24.8 (L) 04/19/2020 2111   MCHC 30.7 04/19/2020 2111   RDW 18.0 (H) 04/19/2020 2111   RDW 17.8 (H) 12/28/2019 1057   LYMPHSABS 1.3 04/19/2020 2111   LYMPHSABS 1.2 12/28/2019 1057   MONOABS 0.4 04/19/2020 2111   EOSABS 0.0 04/19/2020 2111   EOSABS 0.0 12/28/2019 1057   BASOSABS 0.0 04/19/2020 2111   BASOSABS 0.0 12/28/2019 1057    CMP     Component Value Date/Time   NA 138 08/26/2020 1141   NA 139 12/28/2019 1057   K 3.2 (L) 08/26/2020 1141   CL 100 08/26/2020 1141   CO2 24 08/26/2020 1133   GLUCOSE 102 (H) 08/26/2020 1141   BUN 6 08/26/2020 1141   BUN 6 12/28/2019 1057   CREATININE 1.00 08/26/2020 1141   CALCIUM 8.5 (L) 08/26/2020 1133   PROT 8.2 (H) 08/26/2020 1133   PROT 8.4 12/28/2019 1057   ALBUMIN 3.6 08/26/2020 1133   ALBUMIN 4.4 12/28/2019 1057   AST 181 (H) 08/26/2020 1133   ALT 51 (H) 08/26/2020 1133   ALKPHOS 90 08/26/2020 1133   BILITOT 1.0 08/26/2020 1133   BILITOT 0.6 12/28/2019 1057   GFRNONAA >60 08/26/2020 1133   GFRAA 119 12/28/2019 1057   Imaging I have reviewed the images obtained: CT head with no acute changes.  Stable chronic white matter disease.  Aspects 10 CT angiography of the head and neck   IMPRESSION: No vessel occlusion in the neck. Plaque at the proximal right ICA causes approximately 50% stenosis. Plaque at the proximal left ICA causes less than 50% stenosis.    No proximal intracranial vessel occlusion or significant stenosis.    Assessment: 55 year old man past history of alcoholism, hypertension, alcohol withdrawal symptoms, homelessness brought in for slurred speech and left facial weakness. On my examination he definitely has some dysarthria and may be aphasia-versus speech changes related to alcohol/marijuana intoxication but he does have risk factors for stroke. Noncontrasted head CT was unremarkable for acute process.  I proceeded with a CTA head and neck to rule out a vascular occlusion or stenosis which might be responsible for his symptoms. He denies that he is intoxicated at this time  and feels that he is not back at his baseline. Given that he has risk factors of hypertension and possible drug use, and he himself feels that he is not back to baseline, will take him for a stat MRI of the head to ensure that his stroke is ruled out.  Impression: Strokelike symptoms-evaluate for stroke versus alcohol/marijuana intoxication  Recommendations: Given his risk factors, stat MRI brain to rule out a stroke. If the MRI of the brain is negative, no further neurological work-up at that time. Treatment of his alcohol/marijuana intoxication per ER. He will need continued outpatient primary care follow-up to address his medical comorbidities. Might also benefit from social work consult for his homelessness. I will update the team after the MRI is completed. Discussed preliminary plan with Dr. Particia Nearing.  -- Milon Dikes, MD Neurologist [facility] Pager: [phone]  Addendum MRI of the brain stat obtained and reviewed. Also discussed with neuroradiology No evidence of acute stroke Mentation and neurological presentation likely secondary to alcohol intoxication. As per his abdominal and hip pain-has AST ALT are elevated, and he has a history of chronic pain confirmed by him. I will defer the management of these to the ER  providers. Inpatient neurology will be available as needed. Please call with questions. Plan discussed after the MRI with Dr. Particia Nearing. Code stroke can be canceled. -- Milon Dikes, MD Neurologist [facility] Pager: [phone]

## 2020-08-26 NOTE — ED Notes (Addendum)
Patient becoming verbally aggressive with staff at this time. RN and EDP able to deescalate situation, patient given a sandwich.

## 2020-09-13 ENCOUNTER — Other Ambulatory Visit: Payer: Self-pay

## 2020-09-13 ENCOUNTER — Emergency Department (HOSPITAL_COMMUNITY)
Admission: EM | Admit: 2020-09-13 | Discharge: 2020-09-13 | Disposition: A | Discharge status: Home / Self Care | Payer: Self-pay | Attending: Emergency Medicine | Admitting: Emergency Medicine

## 2020-09-13 ENCOUNTER — Encounter (HOSPITAL_COMMUNITY): Payer: Self-pay | Admitting: Emergency Medicine

## 2020-09-13 DIAGNOSIS — R519 Headache, unspecified: Secondary | ICD-10-CM | POA: Insufficient documentation

## 2020-09-13 DIAGNOSIS — Z5321 Procedure and treatment not carried out due to patient leaving prior to being seen by health care provider: Secondary | ICD-10-CM | POA: Insufficient documentation

## 2020-09-13 DIAGNOSIS — R109 Unspecified abdominal pain: Secondary | ICD-10-CM | POA: Insufficient documentation

## 2020-09-13 NOTE — ED Triage Notes (Signed)
Pt coming from home complaint of headache that started today and abdominal pain for several weeks.

## 2020-09-13 NOTE — ED Notes (Signed)
Pt walked out of triage room 2 after repeatedly being told by this tech to wait until he has been seen by the PA.

## 2020-09-13 NOTE — ED Notes (Signed)
Pt asked this NT to not charge his insurance and I notified the pt this NT has nothing to do with who charges who I am here to take care of my patients. Pt then notified Angelia(Registration) that all the hospital is racist and talked to Tiffany(Registration) to not charge his insurance and pt left to wait for ride.

## 2020-09-15 ENCOUNTER — Emergency Department (HOSPITAL_COMMUNITY): Payer: Self-pay

## 2020-09-15 ENCOUNTER — Inpatient Hospital Stay (HOSPITAL_COMMUNITY)
Admission: EM | Admit: 2020-09-15 | Discharge: 2020-09-17 | DRG: 438 | Discharge status: Against Medical Advice | Payer: Self-pay | Attending: Internal Medicine | Admitting: Internal Medicine

## 2020-09-15 ENCOUNTER — Other Ambulatory Visit: Payer: Self-pay

## 2020-09-15 ENCOUNTER — Encounter (HOSPITAL_COMMUNITY): Payer: Self-pay

## 2020-09-15 DIAGNOSIS — R1084 Generalized abdominal pain: Secondary | ICD-10-CM

## 2020-09-15 DIAGNOSIS — K852 Alcohol induced acute pancreatitis without necrosis or infection: Principal | ICD-10-CM | POA: Diagnosis present

## 2020-09-15 DIAGNOSIS — K760 Fatty (change of) liver, not elsewhere classified: Secondary | ICD-10-CM | POA: Diagnosis present

## 2020-09-15 DIAGNOSIS — F10939 Alcohol use, unspecified with withdrawal, unspecified: Secondary | ICD-10-CM | POA: Diagnosis present

## 2020-09-15 DIAGNOSIS — E876 Hypokalemia: Secondary | ICD-10-CM | POA: Diagnosis present

## 2020-09-15 DIAGNOSIS — F1010 Alcohol abuse, uncomplicated: Secondary | ICD-10-CM

## 2020-09-15 DIAGNOSIS — K860 Alcohol-induced chronic pancreatitis: Secondary | ICD-10-CM | POA: Diagnosis present

## 2020-09-15 DIAGNOSIS — K922 Gastrointestinal hemorrhage, unspecified: Secondary | ICD-10-CM

## 2020-09-15 DIAGNOSIS — Z79899 Other long term (current) drug therapy: Secondary | ICD-10-CM

## 2020-09-15 DIAGNOSIS — K859 Acute pancreatitis without necrosis or infection, unspecified: Secondary | ICD-10-CM | POA: Diagnosis present

## 2020-09-15 DIAGNOSIS — F17210 Nicotine dependence, cigarettes, uncomplicated: Secondary | ICD-10-CM | POA: Diagnosis present

## 2020-09-15 DIAGNOSIS — K219 Gastro-esophageal reflux disease without esophagitis: Secondary | ICD-10-CM | POA: Diagnosis present

## 2020-09-15 DIAGNOSIS — Z20822 Contact with and (suspected) exposure to covid-19: Secondary | ICD-10-CM | POA: Diagnosis present

## 2020-09-15 DIAGNOSIS — D61818 Other pancytopenia: Secondary | ICD-10-CM | POA: Diagnosis present

## 2020-09-15 DIAGNOSIS — Z5900 Homelessness unspecified: Secondary | ICD-10-CM

## 2020-09-15 DIAGNOSIS — E871 Hypo-osmolality and hyponatremia: Secondary | ICD-10-CM | POA: Diagnosis present

## 2020-09-15 DIAGNOSIS — F10239 Alcohol dependence with withdrawal, unspecified: Secondary | ICD-10-CM | POA: Diagnosis present

## 2020-09-15 DIAGNOSIS — F1020 Alcohol dependence, uncomplicated: Secondary | ICD-10-CM

## 2020-09-15 DIAGNOSIS — K31811 Angiodysplasia of stomach and duodenum with bleeding: Secondary | ICD-10-CM | POA: Diagnosis present

## 2020-09-15 DIAGNOSIS — I10 Essential (primary) hypertension: Secondary | ICD-10-CM | POA: Diagnosis present

## 2020-09-15 DIAGNOSIS — Z8711 Personal history of peptic ulcer disease: Secondary | ICD-10-CM

## 2020-09-15 LAB — CBC WITH DIFFERENTIAL/PLATELET
Abs Immature Granulocytes: 0.02 10*3/uL (ref 0.00–0.07)
Basophils Absolute: 0 10*3/uL (ref 0.0–0.1)
Basophils Relative: 1 %
Eosinophils Absolute: 0 10*3/uL (ref 0.0–0.5)
Eosinophils Relative: 0 %
HCT: 28.2 % — ABNORMAL LOW (ref 39.0–52.0)
Hemoglobin: 8.8 g/dL — ABNORMAL LOW (ref 13.0–17.0)
Immature Granulocytes: 1 %
Lymphocytes Relative: 25 %
Lymphs Abs: 0.9 10*3/uL (ref 0.7–4.0)
MCH: 24.6 pg — ABNORMAL LOW (ref 26.0–34.0)
MCHC: 31.2 g/dL (ref 30.0–36.0)
MCV: 79 fL — ABNORMAL LOW (ref 80.0–100.0)
Monocytes Absolute: 0.4 10*3/uL (ref 0.1–1.0)
Monocytes Relative: 11 %
Neutro Abs: 2.4 10*3/uL (ref 1.7–7.7)
Neutrophils Relative %: 62 %
Platelets: 141 10*3/uL — ABNORMAL LOW (ref 150–400)
RBC: 3.57 MIL/uL — ABNORMAL LOW (ref 4.22–5.81)
RDW: 25.1 % — ABNORMAL HIGH (ref 11.5–15.5)
WBC: 3.8 10*3/uL — ABNORMAL LOW (ref 4.0–10.5)
nRBC: 0 % (ref 0.0–0.2)

## 2020-09-15 LAB — COMPREHENSIVE METABOLIC PANEL
ALT: 50 U/L — ABNORMAL HIGH (ref 0–44)
AST: 181 U/L — ABNORMAL HIGH (ref 15–41)
Albumin: 3.7 g/dL (ref 3.5–5.0)
Alkaline Phosphatase: 115 U/L (ref 38–126)
Anion gap: 13 (ref 5–15)
BUN: 8 mg/dL (ref 6–20)
CO2: 26 mmol/L (ref 22–32)
Calcium: 8.6 mg/dL — ABNORMAL LOW (ref 8.9–10.3)
Chloride: 101 mmol/L (ref 98–111)
Creatinine, Ser: 0.61 mg/dL (ref 0.61–1.24)
GFR, Estimated: >60 mL/min (ref >60)
Glucose, Bld: 94 mg/dL (ref 70–99)
Potassium: 2.8 mmol/L — ABNORMAL LOW (ref 3.5–5.1)
Sodium: 140 mmol/L (ref 135–145)
Total Bilirubin: 0.9 mg/dL (ref 0.3–1.2)
Total Protein: 8.9 g/dL — ABNORMAL HIGH (ref 6.5–8.1)

## 2020-09-15 LAB — ACETAMINOPHEN LEVEL: Acetaminophen (Tylenol), Serum: <10 ug/mL — ABNORMAL LOW (ref 10–30)

## 2020-09-15 LAB — SALICYLATE LEVEL: Salicylate Lvl: <7 mg/dL — ABNORMAL LOW (ref 7.0–30.0)

## 2020-09-15 LAB — LIPASE, BLOOD: Lipase: 151 U/L — ABNORMAL HIGH (ref 11–51)

## 2020-09-15 LAB — ETHANOL: Alcohol, Ethyl (B): 365 mg/dL (ref <10)

## 2020-09-15 NOTE — ED Provider Notes (Signed)
Emergency Medicine Provider Triage Evaluation Note  Terren Jandreau , a 55 y.o. male  was evaluated in triage.  Pt complains of emesis.  Patient states he has had generalized abdominal pain emesis onset today.  Emesis is NBNB.  History of EtOH use.  Last drink 3 days ago.  Unknown if prior esophageal varices. Previously followed by [facility] in 2021. He is unable to quantify how much he drinks.  Apparently has had EtOH withdrawals previously.  Patient argumentative with EMS, per EMS they were called out for EtOH withdrawal, [place]  Review of Systems  Positive: Abdominal pain, emesis Negative: Fever, chest pain, back pain, melena, sob  Physical Exam  There were no vitals taken for this visit. Gen:   Awake, no distress, emesis to shirt, disheveled Resp:  Normal effort  MSK:   Moves extremities without difficulty, mild tremor bilaterally ABD:  Diffusely tender, greater to epigastric region Other:    Medical Decision Making  Medically screening exam initiated at 7:32 PM.  Appropriate orders placed.  Neco Kling was informed that the remainder of the evaluation will be completed by another provider, this initial triage assessment does not replace that evaluation, and the importance of remaining in the ED until their evaluation is complete.  Emesis, abdominal pain   Iviona Hole A, PA-C 09/15/20 1937    Alvira Monday, MD 09/16/20 [phone]

## 2020-09-15 NOTE — ED Triage Notes (Signed)
[place]. Pt reports drinking a pint a day, but has not had alcohol 2 days. Pt endorses abdominal pain, hip pain, back pain, and tremors. Pt denies being in withdrawal.

## 2020-09-16 ENCOUNTER — Encounter (HOSPITAL_COMMUNITY): Payer: Self-pay

## 2020-09-16 ENCOUNTER — Emergency Department (HOSPITAL_COMMUNITY): Payer: Self-pay

## 2020-09-16 DIAGNOSIS — R195 Other fecal abnormalities: Secondary | ICD-10-CM

## 2020-09-16 DIAGNOSIS — F1020 Alcohol dependence, uncomplicated: Secondary | ICD-10-CM

## 2020-09-16 DIAGNOSIS — F10939 Alcohol use, unspecified with withdrawal, unspecified: Secondary | ICD-10-CM | POA: Diagnosis present

## 2020-09-16 DIAGNOSIS — Z8619 Personal history of other infectious and parasitic diseases: Secondary | ICD-10-CM

## 2020-09-16 DIAGNOSIS — K852 Alcohol induced acute pancreatitis without necrosis or infection: Principal | ICD-10-CM

## 2020-09-16 DIAGNOSIS — K5520 Angiodysplasia of colon without hemorrhage: Secondary | ICD-10-CM

## 2020-09-16 DIAGNOSIS — K859 Acute pancreatitis without necrosis or infection, unspecified: Secondary | ICD-10-CM | POA: Diagnosis present

## 2020-09-16 DIAGNOSIS — F10239 Alcohol dependence with withdrawal, unspecified: Secondary | ICD-10-CM | POA: Diagnosis present

## 2020-09-16 LAB — COMPREHENSIVE METABOLIC PANEL
ALT: 49 U/L — ABNORMAL HIGH (ref 0–44)
AST: 184 U/L — ABNORMAL HIGH (ref 15–41)
Albumin: 3.5 g/dL (ref 3.5–5.0)
Alkaline Phosphatase: 108 U/L (ref 38–126)
Anion gap: 13 (ref 5–15)
BUN: 8 mg/dL (ref 6–20)
CO2: 26 mmol/L (ref 22–32)
Calcium: 8.6 mg/dL — ABNORMAL LOW (ref 8.9–10.3)
Chloride: 97 mmol/L — ABNORMAL LOW (ref 98–111)
Creatinine, Ser: 0.56 mg/dL — ABNORMAL LOW (ref 0.61–1.24)
GFR, Estimated: >60 mL/min (ref >60)
Glucose, Bld: 82 mg/dL (ref 70–99)
Potassium: 2.9 mmol/L — ABNORMAL LOW (ref 3.5–5.1)
Sodium: 136 mmol/L (ref 135–145)
Total Bilirubin: 1.1 mg/dL (ref 0.3–1.2)
Total Protein: 8.3 g/dL — ABNORMAL HIGH (ref 6.5–8.1)

## 2020-09-16 LAB — PHOSPHORUS: Phosphorus: 3 mg/dL (ref 2.5–4.6)

## 2020-09-16 LAB — SEDIMENTATION RATE: Sed Rate: 41 mm/hr — ABNORMAL HIGH (ref 0–16)

## 2020-09-16 LAB — HEPATITIS PANEL, ACUTE
HCV Ab: NONREACTIVE
Hep A IgM: NONREACTIVE
Hep B C IgM: NONREACTIVE
Hepatitis B Surface Ag: NONREACTIVE

## 2020-09-16 LAB — CBC
HCT: 26.6 % — ABNORMAL LOW (ref 39.0–52.0)
Hemoglobin: 8.1 g/dL — ABNORMAL LOW (ref 13.0–17.0)
MCH: 24.4 pg — ABNORMAL LOW (ref 26.0–34.0)
MCHC: 30.5 g/dL (ref 30.0–36.0)
MCV: 80.1 fL (ref 80.0–100.0)
Platelets: 127 10*3/uL — ABNORMAL LOW (ref 150–400)
RBC: 3.32 MIL/uL — ABNORMAL LOW (ref 4.22–5.81)
RDW: 24.7 % — ABNORMAL HIGH (ref 11.5–15.5)
WBC: 4.8 10*3/uL (ref 4.0–10.5)
nRBC: 0 % (ref 0.0–0.2)

## 2020-09-16 LAB — IRON AND TIBC
Iron: 26 ug/dL — ABNORMAL LOW (ref 45–182)
Saturation Ratios: 7 % — ABNORMAL LOW (ref 17.9–39.5)
TIBC: 385 ug/dL (ref 250–450)
UIBC: 359 ug/dL

## 2020-09-16 LAB — RESP PANEL BY RT-PCR (FLU A&B, COVID) ARPGX2
Influenza A by PCR: NEGATIVE
Influenza B by PCR: NEGATIVE
SARS Coronavirus 2 by RT PCR: NEGATIVE

## 2020-09-16 LAB — MAGNESIUM: Magnesium: 1.3 mg/dL — ABNORMAL LOW (ref 1.7–2.4)

## 2020-09-16 LAB — HIV ANTIBODY (ROUTINE TESTING W REFLEX): HIV Screen 4th Generation wRfx: NONREACTIVE

## 2020-09-16 LAB — POC OCCULT BLOOD, ED: Fecal Occult Bld: POSITIVE — AB

## 2020-09-16 MED ORDER — THIAMINE HCL 100 MG/ML IJ SOLN
Freq: Once | INTRAVENOUS | Status: AC
  Filled 2020-09-16: qty 1000

## 2020-09-16 MED ORDER — FOLIC ACID 1 MG PO TABS
1.0000 mg | ORAL_TABLET | Freq: Every day | ORAL | Status: DC
  Administered 2020-09-16 – 2020-09-17 (×2): 1 mg via ORAL
  Filled 2020-09-16 (×2): qty 1

## 2020-09-16 MED ORDER — SODIUM CHLORIDE 0.9 % IV BOLUS
1000.0000 mL | Freq: Once | INTRAVENOUS | Status: AC
  Administered 2020-09-16: 1000 mL via INTRAVENOUS

## 2020-09-16 MED ORDER — OXYCODONE HCL 5 MG PO TABS
5.0000 mg | ORAL_TABLET | ORAL | Status: DC | PRN
  Administered 2020-09-17 (×2): 5 mg via ORAL
  Filled 2020-09-16 (×2): qty 1

## 2020-09-16 MED ORDER — POTASSIUM CHLORIDE 10 MEQ/100ML IV SOLN
10.0000 meq | INTRAVENOUS | Status: DC
  Administered 2020-09-16: 10 meq via INTRAVENOUS
  Filled 2020-09-16: qty 100

## 2020-09-16 MED ORDER — THIAMINE HCL 100 MG/ML IJ SOLN: 100.0000 mg | Freq: Every day | INTRAMUSCULAR | Status: DC

## 2020-09-16 MED ORDER — LORAZEPAM 2 MG/ML IJ SOLN: 0.0000 mg | Freq: Two times a day (BID) | INTRAMUSCULAR | Status: DC

## 2020-09-16 MED ORDER — POTASSIUM CHLORIDE 10 MEQ/100ML IV SOLN: 10.0000 meq | Freq: Once | INTRAVENOUS | Status: DC

## 2020-09-16 MED ORDER — SODIUM CHLORIDE 0.9 % IV SOLN: INTRAVENOUS | Status: DC

## 2020-09-16 MED ORDER — MORPHINE SULFATE (PF) 4 MG/ML IV SOLN
4.0000 mg | Freq: Once | INTRAVENOUS | Status: AC
  Administered 2020-09-16: 4 mg via INTRAVENOUS
  Filled 2020-09-16: qty 1

## 2020-09-16 MED ORDER — IOHEXOL 350 MG/ML SOLN
80.0000 mL | Freq: Once | INTRAVENOUS | Status: AC | PRN
  Administered 2020-09-16: 80 mL via INTRAVENOUS

## 2020-09-16 MED ORDER — LORAZEPAM 2 MG/ML IJ SOLN
0.0000 mg | Freq: Four times a day (QID) | INTRAMUSCULAR | Status: DC
  Administered 2020-09-16 (×2): 1 mg via INTRAVENOUS
  Administered 2020-09-17: 2 mg via INTRAVENOUS
  Filled 2020-09-16 (×4): qty 1

## 2020-09-16 MED ORDER — SODIUM CHLORIDE (PF) 0.9 % IJ SOLN
INTRAMUSCULAR | Status: AC
  Filled 2020-09-16: qty 50

## 2020-09-16 MED ORDER — PANTOPRAZOLE 80MG IVPB - SIMPLE MED
80.0000 mg | Freq: Once | INTRAVENOUS | Status: AC
  Administered 2020-09-16: 80 mg via INTRAVENOUS
  Filled 2020-09-16: qty 80

## 2020-09-16 MED ORDER — POTASSIUM CHLORIDE 10 MEQ/100ML IV SOLN
10.0000 meq | INTRAVENOUS | Status: AC
  Administered 2020-09-16 (×5): 10 meq via INTRAVENOUS
  Filled 2020-09-16 (×5): qty 100

## 2020-09-16 MED ORDER — ADULT MULTIVITAMIN W/MINERALS CH
1.0000 | ORAL_TABLET | Freq: Every day | ORAL | Status: DC
  Administered 2020-09-16 – 2020-09-17 (×2): 1 via ORAL
  Filled 2020-09-16 (×2): qty 1

## 2020-09-16 MED ORDER — PANTOPRAZOLE SODIUM 40 MG IV SOLR
40.0000 mg | Freq: Two times a day (BID) | INTRAVENOUS | Status: DC
  Administered 2020-09-16 – 2020-09-17 (×2): 40 mg via INTRAVENOUS
  Filled 2020-09-16 (×2): qty 40

## 2020-09-16 MED ORDER — ONDANSETRON HCL 4 MG PO TABS: 4.0000 mg | ORAL_TABLET | Freq: Four times a day (QID) | ORAL | Status: DC | PRN

## 2020-09-16 MED ORDER — ONDANSETRON HCL 4 MG/2ML IJ SOLN
4.0000 mg | Freq: Four times a day (QID) | INTRAMUSCULAR | Status: DC | PRN
  Administered 2020-09-17: 4 mg via INTRAVENOUS
  Filled 2020-09-16: qty 2

## 2020-09-16 MED ORDER — NICOTINE 14 MG/24HR TD PT24
14.0000 mg | MEDICATED_PATCH | Freq: Every day | TRANSDERMAL | Status: DC
  Administered 2020-09-16 – 2020-09-17 (×3): 14 mg via TRANSDERMAL
  Filled 2020-09-16 (×3): qty 1

## 2020-09-16 MED ORDER — LORAZEPAM 2 MG/ML IJ SOLN
1.0000 mg | INTRAMUSCULAR | Status: DC | PRN
  Administered 2020-09-17: 2 mg via INTRAVENOUS

## 2020-09-16 MED ORDER — ONDANSETRON HCL 4 MG/2ML IJ SOLN
4.0000 mg | Freq: Once | INTRAMUSCULAR | Status: AC
  Administered 2020-09-16: 4 mg via INTRAVENOUS
  Filled 2020-09-16: qty 2

## 2020-09-16 MED ORDER — LACTATED RINGERS IV SOLN: INTRAVENOUS | Status: AC

## 2020-09-16 MED ORDER — LORAZEPAM 2 MG/ML IJ SOLN
1.0000 mg | Freq: Once | INTRAMUSCULAR | Status: AC
  Administered 2020-09-16: 1 mg via INTRAVENOUS
  Filled 2020-09-16: qty 1

## 2020-09-16 MED ORDER — LORAZEPAM 1 MG PO TABS
1.0000 mg | ORAL_TABLET | ORAL | Status: DC | PRN
Start: 2020-09-16 — End: 2020-09-17
  Administered 2020-09-17: 1 mg via ORAL
  Filled 2020-09-16: qty 1

## 2020-09-16 MED ORDER — THIAMINE HCL 100 MG PO TABS
100.0000 mg | ORAL_TABLET | Freq: Every day | ORAL | Status: DC
  Administered 2020-09-16 – 2020-09-17 (×2): 100 mg via ORAL
  Filled 2020-09-16 (×2): qty 1

## 2020-09-16 MED ORDER — IBUPROFEN 200 MG PO TABS
400.0000 mg | ORAL_TABLET | Freq: Four times a day (QID) | ORAL | Status: DC | PRN
Start: 2020-09-16 — End: 2020-09-16

## 2020-09-16 NOTE — H&P (Signed)
History and Physical    Jaquez Farrington TDD:[id number] DOB: [birth year] DOA: 09/15/2020  PCP: Pcp, No  Patient coming from: Shelter  Chief Complaint: abdominal pain  HPI: Ikenna Ohms is a 55 y.o. male with medical history significant of EtOH abuse, tobacco abuse. Presenting with 4 days of abdominal pain. It is sharp and non-radiating. It is located periumbilically. He has had N/V. He reports intermittent dark, tarry stool. He has felt increasingly weak and jittery. He has not found anything that helps. Eating seems to make things worse. His symptoms worsened yesterday, so he came to the ED for assistance.     ED Course: He was found to have elevated LFTs, lipase. His imaging was positive for pancreatitis. He had elevated blood alcohol. He has low potassium. He appeared to be starting withdrawal. He was given fluids. Potassium was ordered. He was started on protonix. TRH was called for admission.   Review of Systems:  Denies CP, dyspnea, fevers, sick contacts. Review of systems is otherwise negative for all not mentioned in HPI.   PMHx Past Medical History:  Diagnosis Date   Alcohol withdrawal syndrome with complication (HCC)    Alcoholism (HCC)    Elevated AST (SGOT)    Homeless    Hypertension    Symptomatic anemia 11/23/2015    PSHx Past Surgical History:  Procedure Laterality Date   BIOPSY  06/23/2019   Procedure: BIOPSY;  Surgeon: Shellia Cleverly, DO;  Location: [facility];  Service: Gastroenterology;;   ESOPHAGOGASTRODUODENOSCOPY (EGD) WITH PROPOFOL N/A 06/23/2019   Procedure: ESOPHAGOGASTRODUODENOSCOPY (EGD) WITH PROPOFOL;  Surgeon: Shellia Cleverly, DO;  Location: [facility];  Service: Gastroenterology;  Laterality: N/A;   HOT HEMOSTASIS N/A 06/23/2019   Procedure: HOT HEMOSTASIS (ARGON PLASMA COAGULATION/BICAP);  Surgeon: Shellia Cleverly, DO;  Location: [facility] ENDOSCOPY;  Service: Gastroenterology;  Laterality: N/A;    SocHx  reports that he has been smoking. He has  a 30.00 pack-year smoking history. He has never used smokeless tobacco. He reports current alcohol use. He reports that he does not use drugs.  No Known Allergies  FamHx Family History  Problem Relation Age of Onset   Diabetes Mellitus II Neg Hx    Colon cancer Neg Hx    Stomach cancer Neg Hx    Pancreatic cancer Neg Hx     Prior to Admission medications   Medication Sig Start Date End Date Taking? Authorizing Provider  citalopram (CELEXA) 20 MG tablet TAKE 1 TABLET (20 MG TOTAL) BY MOUTH DAILY. 02/02/20 02/01/21  [name], MD  folic acid (FOLVITE) 1 MG tablet Take 1 tablet (1 mg total) by mouth daily. 05/03/20   [name], MD  omeprazole (PRILOSEC) 40 MG capsule Take 1 capsule (40 mg total) by mouth 2 (two) times daily before a meal. 12/15/19   [name], MD  ondansetron (ZOFRAN ODT) 8 MG disintegrating tablet Take 1 tablet (8 mg total) by mouth every 8 (eight) hours as needed for nausea or vomiting. 05/03/20   [name], MD  thiamine 100 MG tablet Take 1 tablet (100 mg total) by mouth daily. 05/03/20   [name], MD    Physical Exam: Vitals:   09/15/20 1933 09/15/20 1936 09/16/20 0626 09/16/20 0736  BP: 137/85  (!) 142/89 (!) 143/97  Pulse: 98 (!) 103 99 88  Resp: 12  18 18   Temp: 99 F (37.2 C)     TempSrc: Oral     SpO2: (!) 87% 94% 100% 94%  General: 55 y.o. male resting in bed in NAD Eyes: PERRL, normal sclera ENMT: Nares patent w/o discharge, orophaynx clear, dentition normal, ears w/o discharge/lesions/ulcers Neck: Supple, trachea midline Cardiovascular: RRR, +S1, S2, no m/g/r, equal pulses throughout Respiratory: CTABL, no w/r/r, normal WOB GI: BS+, ND, TTP LUQ, no masses noted, no organomegaly noted MSK: No e/c/c Skin: No rashes, bruises, ulcerations noted Neuro: A&O x 3, no focal deficits Psyc: Appropriate interaction and flat affect, calm/cooperative; his is jittery  Labs on Admission: I have personally reviewed following  labs and imaging studies  CBC: Recent Labs  Lab 09/15/20 1946  WBC 3.8*  NEUTROABS 2.4  HGB 8.8*  HCT 28.2*  MCV 79.0*  PLT 141*   Basic Metabolic Panel: Recent Labs  Lab 09/15/20 1946  NA 140  K 2.8*  CL 101  CO2 26  GLUCOSE 94  BUN 8  CREATININE 0.61  CALCIUM 8.6*   GFR: CrCl cannot be calculated (Unknown ideal weight.). Liver Function Tests: Recent Labs  Lab 09/15/20 1946  AST 181*  ALT 50*  ALKPHOS 115  BILITOT 0.9  PROT 8.9*  ALBUMIN 3.7   Recent Labs  Lab 09/15/20 1946  LIPASE 151*   No results for input(s): AMMONIA in the last 168 hours. Coagulation Profile: No results for input(s): INR, PROTIME in the last 168 hours. Cardiac Enzymes: No results for input(s): CKTOTAL, CKMB, CKMBINDEX, TROPONINI in the last 168 hours. BNP (last 3 results) No results for input(s): PROBNP in the last 8760 hours. HbA1C: No results for input(s): HGBA1C in the last 72 hours. CBG: No results for input(s): GLUCAP in the last 168 hours. Lipid Profile: No results for input(s): CHOL, HDL, LDLCALC, TRIG, CHOLHDL, LDLDIRECT in the last 72 hours. Thyroid Function Tests: No results for input(s): TSH, T4TOTAL, FREET4, T3FREE, THYROIDAB in the last 72 hours. Anemia Panel: No results for input(s): VITAMINB12, FOLATE, FERRITIN, TIBC, IRON, RETICCTPCT in the last 72 hours. Urine analysis:    Component Value Date/Time   COLORURINE YELLOW 08/26/2020 1226   APPEARANCEUR CLEAR 08/26/2020 1226   LABSPEC 1.016 08/26/2020 1226   PHURINE 6.0 08/26/2020 1226   GLUCOSEU NEGATIVE 08/26/2020 1226   HGBUR NEGATIVE 08/26/2020 1226   BILIRUBINUR NEGATIVE 08/26/2020 1226   KETONESUR NEGATIVE 08/26/2020 1226   PROTEINUR NEGATIVE 08/26/2020 1226   NITRITE NEGATIVE 08/26/2020 1226   LEUKOCYTESUR NEGATIVE 08/26/2020 1226    Radiological Exams on Admission: CT Abdomen Pelvis W Contrast  Result Date: 09/16/2020 CLINICAL DATA:  Abdominal pain. EXAM: CT ABDOMEN AND PELVIS WITH CONTRAST  TECHNIQUE: Multidetector CT imaging of the abdomen and pelvis was performed using the standard protocol following bolus administration of intravenous contrast. CONTRAST:  93mL OMNIPAQUE IOHEXOL 350 MG/ML SOLN COMPARISON:  October 20, 2019 FINDINGS: Lower chest: Mild linear scarring and/or atelectasis is seen within the posteromedial aspect of the right lung base. Hepatobiliary: There is diffuse fatty infiltration of the liver parenchyma. No focal liver abnormality is seen. No gallstones, gallbladder wall thickening, or biliary dilatation. Pancreas: Mild inflammatory fat stranding is seen in between the pancreatic head and proximal duodenum. No pancreatic ductal dilatation is identified Spleen: Normal in size without focal abnormality. Adrenals/Urinary Tract: Adrenal glands are unremarkable. Kidneys are normal, without renal calculi, focal lesion, or hydronephrosis. Bladder is unremarkable. Stomach/Bowel: Stomach is within normal limits. Appendix appears normal. No evidence of bowel dilatation. Mild inflammatory fat stranding is seen in between the proximal duodenum and head of the pancreas. Vascular/Lymphatic: Aortic atherosclerosis. No enlarged abdominal or pelvic lymph nodes. Reproductive: Prostate  is unremarkable. Other: No abdominal wall hernia or abnormality. No abdominopelvic ascites. Musculoskeletal: Moderate to marked severity degenerative changes are seen within the lower lumbar spine. IMPRESSION: 1. Findings suggestive of mild acute pancreatitis. Correlation with pancreatic enzymes is recommended. 2. Given the location of the peripancreatic inflammatory fat stranding, mild duodenitis cannot be excluded. 3. Fatty liver. Electronically Signed   By: Aram Candela M.D.   On: 09/16/2020 04:24    EKG: None obtained in ED  Assessment/Plan Pancreatitis     - admit to inpt, progressive     - fluids, NPO except sip/ice chips, anti-emetics, pain control     - no abscess seen on CT  ?GIB Pancytopenia      - reports intermittent dark, tarry stools over the last several days     - Hgb is slightly lower than base line     - q6h H&H, protonix; transfuse for Hgb <7     - consulted [facility] (has seen in office as recently as 02/2020)     - leukopenia is likely secondary to liver disease     - check iron studies     - SCDs for DVT PPx  EtOH abuse EtOH withdrawal     - counseled against further EtOH use     - start on CIWA  Hypokalemia     - replace K+; also check Mg2+  Elevated LFTs     - check hepatitis panel     - fatty liver disease noted on imaging  Tobacco abuse     - counseled against further tobacco use     - nicotine patch ordered  DVT prophylaxis: SCDs  Code Status: FULL  Family Communication: None at bedside  Consults called: [facility] paged 0928 hrs   Status is: Inpatient  Remains inpatient appropriate because:Inpatient level of care appropriate due to severity of illness  Dispo: The patient is from: Home              Anticipated d/c is to: Home              Patient currently is not medically stable to d/c.   Difficult to place patient No  Time spent coordinating admission: 80 minutes  Seidy Labreck A Zaeda Mcferran DO [facility]  If 7PM-7AM, please contact night-coverage [email or web address]  09/16/2020, 8:11 AM

## 2020-09-16 NOTE — Consult Note (Signed)
Gastroenterology Inpatient Consultation   Attending Requesting [place] [place] Hospital Day: 2  Reason for Consult Pancreatitis Recurrent and Anemia    History of Present Illness  Samuel Blevins is a 55 y.o. 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 pylori eradication pending), duodenal AVMs.  The GI service is consulted for evaluation and management of abdominal pain with recurrent pancreatitis and anemia and dark stools.    This is a patient with known chronic alcohol use disorder.  He presents with a few day history of abdominal discomfort in the mid and lower abdomen that has progressed and become generalized.  He has had nausea and vomiting.  He felt that he potentially could have recurrent pancreatitis and came in for further evaluation with the progression of pain.  He states he has had increasing amounts of NSAID use (aspirin) over the course of the last few weeks but not on a daily basis.  He was using this for headache pain not truly abdominal pain.  Patient continues to drink alcohol.  He currently looks to be going through withdrawal but states he is only drinking half pint per day of hard liquor.  Patient has not had a previous colonoscopy.  Last endoscopy was in 2021 with a normal esophagus, gastritis consistent with H. pylori infection and duodenal AVMs that were ablated.  Patient had a repeat blood count a month ago and his hemoglobin was in the 11's.  His hemoglobin upon recheck here in the hospital is in the eights.  His lipase is nearly 3 times the upper limit of normal.  Cross-sectional imaging is noted below but suggest pancreatitis and possible duodenitis.  The patient is currently in the emergency department and being admitted to the hospital.  He feels his pain is progressing and worsening at this time in the mid abdomen.  GI Review of  Systems Positive as above including issues of pyrosis (states he is taking his medications), nausea, bloating Negative for dysphagia, odynophagia, hematochezia   Review of Systems  General: Denies fevers/chills/weight loss unintentionally HEENT: Denies oral lesions Cardiovascular: Denies chest pain Pulmonary: Denies shortness of breath Gastroenterological: See HPI Genitourinary: Denies darkened urine or hematuria Hematological: Denies easy bruising/bleeding Dermatological: Denies jaundice Psychological: Mood is stable though he is falling asleep at times and needs to be redirected   Histories  Past Medical History Past Medical History:  Diagnosis Date   Alcohol withdrawal syndrome with complication ([place])    Alcoholism ([place])    Elevated AST (SGOT)    Homeless    Hypertension    Symptomatic anemia 11/23/2015   Past Surgical History:  Procedure Laterality Date   BIOPSY  06/23/2019   Procedure: BIOPSY;  Surgeon: Lavena Bullion, DO;  Location: [place] [facility]OSCOPY;  Service: Gastroenterology;;   ESOPHAGOGASTRODUODENOSCOPY (EGD) WITH PROPOFOL N/A 06/23/2019   Procedure: ESOPHAGOGASTRODUODENOSCOPY (EGD) WITH PROPOFOL;  Surgeon: Lavena Bullion, DO;  Location: [place];  Service: Gastroenterology;  Laterality: N/A;   HOT HEMOSTASIS N/A 06/23/2019   Procedure: HOT HEMOSTASIS (ARGON PLASMA COAGULATION/BICAP);  Surgeon: Lavena Bullion, DO;  Location: [facility] ENDOSCOPY;  Service: Gastroenterology;  Laterality: N/A;    Allergies No Known Allergies  Family History Family History  Problem Relation Age of Onset   Diabetes Mellitus II Neg Hx    Colon cancer Neg Hx    Stomach cancer Neg Hx    Pancreatic cancer Neg Hx  Social History Social History   Socioeconomic History   Marital status: Single    Spouse name: Not on file   Number of children: Not on file   Years of education: Not on file   Highest education level: Not on file  Occupational History   Not on file  Tobacco  Use   Smoking status: Every Day    Packs/day: 1.00    Years: 30.00    Pack years: 30.00    Types: Cigarettes   Smokeless tobacco: Never  Vaping Use   Vaping Use: Never used  Substance and Sexual Activity   Alcohol use: Yes   Drug use: Never   Sexual activity: Not Currently  Other Topics Concern   Not on file  Social History Narrative   Not on file   Social Determinants of Health   Financial Resource Strain: Not on file  Food Insecurity: Not on file  Transportation Needs: Not on file  Physical Activity: Not on file  Stress: Not on file  Social Connections: Not on file  Intimate Partner Violence: Not on file    Medications  Home Medications No current facility-administered medications on file prior to encounter.   Current Outpatient Medications on File Prior to Encounter  Medication Sig Dispense Refill   citalopram (CELEXA) 20 MG tablet TAKE 1 TABLET (20 MG TOTAL) BY MOUTH DAILY. 30 tablet 3   folic acid (FOLVITE) 1 MG tablet Take 1 tablet (1 mg total) by mouth daily. 60 tablet 1   omeprazole (PRILOSEC) 40 MG capsule Take 1 capsule (40 mg total) by mouth 2 (two) times daily before a meal. 60 capsule 6   ondansetron (ZOFRAN ODT) 8 MG disintegrating tablet Take 1 tablet (8 mg total) by mouth every 8 (eight) hours as needed for nausea or vomiting. 30 tablet 0   thiamine 100 MG tablet Take 1 tablet (100 mg total) by mouth daily. 60 tablet 3   Scheduled Inpatient Medications  folic acid  1 mg Oral Daily   LORazepam  0-4 mg Intravenous Q6H   Followed by   Derrill Memo ON 09/18/2020] LORazepam  0-4 mg Intravenous Q12H   multivitamin with minerals  1 tablet Oral Daily   nicotine  14 mg Transdermal Daily   thiamine  100 mg Oral Daily   Or   thiamine  100 mg Intravenous Daily   Continuous Inpatient Infusions  pantoprazole (PROTONIX) IV     potassium chloride     banana bag IV 1000 mL     PRN Inpatient Medications ibuprofen, LORazepam **OR** LORazepam, ondansetron **OR**  ondansetron (ZOFRAN) IV, oxyCODONE   Physical Examination  BP (!) 143/97 (BP Location: Right Arm)   Pulse 88   Temp 99 F (37.2 C) (Oral)   Resp 18   SpO2 94%  GEN: Resting in bed, appears chronically ill, nontoxic however at this time  PSYCH: Cooperative, though he is falling asleep at times and shaking EYE: Conjunctivae pink, sclerae anicteric ENT: Dry mucous membranes CV: Nontachycardic RESP: Decreased breath sounds at the bases bilaterally no wheezing GI: NABS, soft, mild protuberant abdomen, tenderness to palpation throughout the abdomen but mostly in the midepigastrium, volitional guarding present throughout, no rebound  MSK/EXT: Bilateral pedal edema is present SKIN: No jaundice NEURO:  Alert & Oriented x 3, no focal deficits, no evidence of asterixis but he is shaking   Review of Data  I reviewed the following data at the time of this encounter:  Laboratory Studies   Recent Labs  Lab  09/16/20 0852  NA 136  K 2.9*  CL 97*  CO2 26  BUN 8  CREATININE 0.56*  GLUCOSE 82  CALCIUM 8.6*  MG 1.3*   Recent Labs  Lab 09/16/20 0852  AST 184*  ALT 49*  ALKPHOS 108    Recent Labs  Lab 09/15/20 1946  WBC 3.8*  HGB 8.8*  HCT 28.2*  PLT 141*   No results for input(s): APTT, INR in the last 168 hours.  Imaging Studies  July 2022 CT abdomen pelvis with contrast IMPRESSION: 1. Findings suggestive of mild acute pancreatitis. Correlation with pancreatic enzymes is recommended. 2. Given the location of the peripancreatic inflammatory fat stranding, mild duodenitis cannot be excluded. 3. Fatty liver.  GI Procedures and Studies  2021 EGD - Normal esophagus. - Gastritis. Biopsied. 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 Treated with argon plasma coagulation (APC). - Mucosal changes in the duodenum. Biopsied.   Assessment  Samuel Blevins is a 55 y.o. male with a pmh significant for homelessness, hypertension, alcohol use disorder, recurrent pancreatitis (felt  secondary to alcohol), GERD, PUD (history H. pylori eradication pending), duodenal AVMs.  The GI service is consulted for evaluation and management of abdominal pain with recurrent pancreatitis and anemia and dark stools.   The patient is hemodynamically stable.  Clinically, it seems that the patient is developing signs of alcohol withdrawal.  The etiology of the patient's pancreatitis is most likely a result of his continued alcohol consumption (hard to determine the overt/total amount due to his current status).  His LFT pattern is most consistent with that.  There is no bile duct dilation at this time on cross-sectional imaging thus biliary obstruction is not playing a role.  Certainly the patient is at risk of chronic pancreatitis and at some point may benefit from an outpatient endoscopic ultrasound if he continues to have pancreatitis episodes and is abstinent of alcohol but until that time, not clear that we would get much benefit from an EUS.  He has had previous ultrasound imaging that has shown no stones in the gallbladder.  At this point supportive measures are important from a pancreatitis perspective and hopefully over the course of the coming days he will improve as he also gets treated for potential alcohol withdrawal.  From a blood loss anemia perspective, and the patient's dark stools that he reports, and positive FOBT, I think it is reasonable that repeat upper endoscopic evaluation be considered at some point.  For now he will maintain IV PPI with his history of H. pylori gastritis and prior AVMs.  It is possible that he could have Mallory-Weiss tear or recurrent peptic ulcer disease.  For now, would recommend monitoring and not plan endoscopic evaluation until patient is clinically stable and hopefully through alcohol withdrawal.  I did discuss the possibility of an endoscopic evaluation the patient and he does agree that if it is necessary he would be okay with moving forward with that.  The  risks and benefits of endoscopic evaluation were discussed with the patient; these include but are not limited to the risk of perforation, infection, bleeding, missed lesions, lack of diagnosis, severe illness requiring hospitalization, as well as anesthesia and sedation related illnesses.  The patient and/or family is agreeable to proceed.  If the patient's hemoglobin is stable upon recheck at noon then I think it is reasonable to consider allowing the patient to be on clear liquids today.  The inpatient [facility] GI service will continue to monitor the patient and see  in follow-up tomorrow.   Plan/Recommendations  CBC at noon - If stable and not dramatically decreased then okay for clear liquids - If dramatically decreased may maintain n.p.o. status until repeat CBC later in the evening - Iron studies are in process and if patient has evidence of iron deficiency, should undergo IV iron infusion  Endoscopy for follow-up of dark stools is reasonable but will be assessed on a daily basis based on patient's status If hemodynamic instability occurs with transfusion dependent anemia and overt hematemesis/coffee-ground emesis, please alert the GI service and a more urgent endoscopy will be considered ESR/CRP to be added to see status of inflammatory process from pancreatitis Complete alcohol abstinence is important IV PPI twice daily versus gtt for now If no endoscopic evaluation is going to be pursued tomorrow, due to overall stability then agree with progressive diet tolerance advancement as nutrition is key to pancreatitis treatment Patient may complete normal saline bolus and I have ordered for supplemental IV LR to timeout tomorrow and be reassessed for need of continued use Suggest urine output via condom catheter to be monitored but will defer to medicine team to consider this Any endoscopic evaluation will need COVID status no (pending nasopharyngeal swab)   Thank you for this consult.  We will  continue to follow.  Please page/call with questions or concerns.   Justice Britain, MD [facility] Advanced Endoscopy Office # [phone]

## 2020-09-16 NOTE — ED Provider Notes (Signed)
[facility]-EMERGENCY DEPT Provider Note   CSN: [id number] Arrival date & time: 09/15/20  1925     History Chief Complaint  Patient presents with   Withdrawal   Abdominal Pain    Samuel Blevins is a 55 y.o. male.  55 year old male with prior medical history as detailed below presents for evaluation.  Patient with significant history of alcohol use and abuse.  Patient reports persistent abdominal discomfort and nausea for the last 4 days.  Patient reports multiple episodes of vomiting.  Patient reports prior history of pancreatitis.  Patient denies bloody emesis.  He does report intermittent dark-colored stools over the last several days.  He denies fever.  He reports prior history of withdrawal.  He denies prior history of seizure.  He reports that his last drink was yesterday prior to coming to the ED.  The history is provided by the patient and medical records.  Abdominal Pain Pain location:  Generalized Pain quality: aching and cramping   Pain radiates to:  Does not radiate Pain severity:  Moderate Onset quality:  Gradual Duration:  4 days Timing:  Constant Progression:  Worsening Chronicity:  New Context: alcohol use       Past Medical History:  Diagnosis Date   Alcohol withdrawal syndrome with complication (HCC)    Alcoholism (HCC)    Elevated AST (SGOT)    Homeless    Hypertension    Symptomatic anemia 11/23/2015    Patient Active Problem List   Diagnosis Date Noted   Chronic pain syndrome 12/28/2019   Pancreatic pseudocyst 10/28/2019   Pancreatitis, acute 10/28/2019   Gastritis and gastroduodenitis    AVM (arteriovenous malformation) of small bowel, acquired    Chronic alcoholism (HCC) 06/21/2019   Tobacco abuse 03/17/2014   Essential hypertension 03/17/2014    Past Surgical History:  Procedure Laterality Date   BIOPSY  06/23/2019   Procedure: BIOPSY;  Surgeon: Shellia Cleverly, DO;  Location: [facility] ENDOSCOPY;  Service:  Gastroenterology;;   ESOPHAGOGASTRODUODENOSCOPY (EGD) WITH PROPOFOL N/A 06/23/2019   Procedure: ESOPHAGOGASTRODUODENOSCOPY (EGD) WITH PROPOFOL;  Surgeon: Shellia Cleverly, DO;  Location: [facility] ENDOSCOPY;  Service: Gastroenterology;  Laterality: N/A;   HOT HEMOSTASIS N/A 06/23/2019   Procedure: HOT HEMOSTASIS (ARGON PLASMA COAGULATION/BICAP);  Surgeon: Shellia Cleverly, DO;  Location: [facility] ENDOSCOPY;  Service: Gastroenterology;  Laterality: N/A;       Family History  Problem Relation Age of Onset   Diabetes Mellitus II Neg Hx    Colon cancer Neg Hx    Stomach cancer Neg Hx    Pancreatic cancer Neg Hx     Social History   Tobacco Use   Smoking status: Every Day    Packs/day: 1.00    Years: 30.00    Pack years: 30.00    Types: Cigarettes   Smokeless tobacco: Never  Vaping Use   Vaping Use: Never used  Substance Use Topics   Alcohol use: Yes   Drug use: Never    Home Medications Prior to Admission medications   Medication Sig Start Date End Date Taking? Authorizing Provider  citalopram (CELEXA) 20 MG tablet TAKE 1 TABLET (20 MG TOTAL) BY MOUTH DAILY. 02/02/20 02/01/21  [name], MD  folic acid (FOLVITE) 1 MG tablet Take 1 tablet (1 mg total) by mouth daily. 05/03/20   [name], MD  omeprazole (PRILOSEC) 40 MG capsule Take 1 capsule (40 mg total) by mouth 2 (two) times daily before a meal. 12/15/19   [name],  MD  ondansetron (ZOFRAN ODT) 8 MG disintegrating tablet Take 1 tablet (8 mg total) by mouth every 8 (eight) hours as needed for nausea or vomiting. 05/03/20   [name], MD  thiamine 100 MG tablet Take 1 tablet (100 mg total) by mouth daily. 05/03/20   [name], MD    Allergies    Patient has no known allergies.  Review of Systems   Review of Systems  Gastrointestinal:  Positive for abdominal pain.  All other systems reviewed and are negative.  Physical Exam Updated Vital Signs BP (!) 143/97 (BP Location: Right Arm)   Pulse 88    Temp 99 F (37.2 C) (Oral)   Resp 18   SpO2 94%   Physical Exam Vitals and nursing note reviewed.  Constitutional:      General: He is not in acute distress.    Appearance: Normal appearance. He is well-developed.  HENT:     Head: Normocephalic and atraumatic.  Eyes:     Conjunctiva/sclera: Conjunctivae normal.     Pupils: Pupils are equal, round, and reactive to light.  Cardiovascular:     Rate and Rhythm: Normal rate and regular rhythm.     Heart sounds: Normal heart sounds.  Pulmonary:     Effort: Pulmonary effort is normal. No respiratory distress.     Breath sounds: Normal breath sounds.  Abdominal:     General: There is no distension.     Palpations: Abdomen is soft.     Tenderness: There is generalized abdominal tenderness.  Musculoskeletal:        General: No deformity. Normal range of motion.     Cervical back: Normal range of motion and neck supple.  Skin:    General: Skin is warm and dry.  Neurological:     General: No focal deficit present.     Mental Status: He is alert and oriented to person, place, and time.    ED Results / Procedures / Treatments   Labs (all labs ordered are listed, but only abnormal results are displayed) Labs Reviewed  CBC WITH DIFFERENTIAL/PLATELET - Abnormal; Notable for the following components:      Result Value   WBC 3.8 (*)    RBC 3.57 (*)    Hemoglobin 8.8 (*)    HCT 28.2 (*)    MCV 79.0 (*)    MCH 24.6 (*)    RDW 25.1 (*)    Platelets 141 (*)    All other components within normal limits  COMPREHENSIVE METABOLIC PANEL - Abnormal; Notable for the following components:   Potassium 2.8 (*)    Calcium 8.6 (*)    Total Protein 8.9 (*)    AST 181 (*)    ALT 50 (*)    All other components within normal limits  LIPASE, BLOOD - Abnormal; Notable for the following components:   Lipase 151 (*)    All other components within normal limits  ETHANOL - Abnormal; Notable for the following components:   Alcohol, Ethyl (B) 365 (*)     All other components within normal limits  SALICYLATE LEVEL - Abnormal; Notable for the following components:   Salicylate Lvl <7.0 (*)    All other components within normal limits  ACETAMINOPHEN LEVEL - Abnormal; Notable for the following components:   Acetaminophen (Tylenol), Serum <10 (*)    All other components within normal limits  POC OCCULT BLOOD, ED    EKG None  Radiology CT Abdomen Pelvis W Contrast  Result  Date: 09/16/2020 CLINICAL DATA:  Abdominal pain. EXAM: CT ABDOMEN AND PELVIS WITH CONTRAST TECHNIQUE: Multidetector CT imaging of the abdomen and pelvis was performed using the standard protocol following bolus administration of intravenous contrast. CONTRAST:  64mL OMNIPAQUE IOHEXOL 350 MG/ML SOLN COMPARISON:  October 20, 2019 FINDINGS: Lower chest: Mild linear scarring and/or atelectasis is seen within the posteromedial aspect of the right lung base. Hepatobiliary: There is diffuse fatty infiltration of the liver parenchyma. No focal liver abnormality is seen. No gallstones, gallbladder wall thickening, or biliary dilatation. Pancreas: Mild inflammatory fat stranding is seen in between the pancreatic head and proximal duodenum. No pancreatic ductal dilatation is identified Spleen: Normal in size without focal abnormality. Adrenals/Urinary Tract: Adrenal glands are unremarkable. Kidneys are normal, without renal calculi, focal lesion, or hydronephrosis. Bladder is unremarkable. Stomach/Bowel: Stomach is within normal limits. Appendix appears normal. No evidence of bowel dilatation. Mild inflammatory fat stranding is seen in between the proximal duodenum and head of the pancreas. Vascular/Lymphatic: Aortic atherosclerosis. No enlarged abdominal or pelvic lymph nodes. Reproductive: Prostate is unremarkable. Other: No abdominal wall hernia or abnormality. No abdominopelvic ascites. Musculoskeletal: Moderate to marked severity degenerative changes are seen within the lower lumbar spine.  IMPRESSION: 1. Findings suggestive of mild acute pancreatitis. Correlation with pancreatic enzymes is recommended. 2. Given the location of the peripancreatic inflammatory fat stranding, mild duodenitis cannot be excluded. 3. Fatty liver. Electronically Signed   By: Aram Candela M.D.   On: 09/16/2020 04:24    Procedures Procedures   Medications Ordered in ED Medications  sodium chloride 0.9 % bolus 1,000 mL (has no administration in time range)  ondansetron (ZOFRAN) injection 4 mg (has no administration in time range)  LORazepam (ATIVAN) injection 1 mg (has no administration in time range)  morphine 4 MG/ML injection 4 mg (has no administration in time range)  potassium chloride 10 mEq in 100 mL IVPB (has no administration in time range)  sodium chloride (PF) 0.9 % injection (  Given by Other 09/16/20 0724)  iohexol (OMNIPAQUE) 350 MG/ML injection 80 mL (80 mLs Intravenous Contrast Given 09/16/20 0359)    ED Course  I have reviewed the triage vital signs and the nursing notes.  Pertinent labs & imaging results that were available during my care of the patient were reviewed by me and considered in my medical decision making (see chart for details).  Clinical Course as of 09/16/20 7169  Sat Sep 16, 2020  0809 SARS CORONAVIRUS 2 (TAT 6-24 HRS) Nasopharyngeal Nasopharyngeal Swab [PM]    Clinical Course User Index [PM] Wynetta Fines, MD   MDM Rules/Calculators/A&P                           MDM  MSE complete  Shyhiem Beeney was evaluated in Emergency Department on 09/16/2020 for the symptoms described in the history of present illness. He was evaluated in the context of the global COVID-19 pandemic, which necessitated consideration that the patient might be at risk for infection with the SARS-CoV-2 virus that causes COVID-19. Institutional protocols and algorithms that pertain to the evaluation of patients at risk for COVID-19 are in a state of rapid change based on information  released by regulatory bodies including the CDC and federal and state organizations. These policies and algorithms were followed during the patient's care in the ED.   Patient is presenting with complaint of abdominal pain and associated nausea.  Patient with presentation consistent with acute pancreatitis.  Patient with longstanding history of alcohol use and abuse.  On evaluation this morning he appears to be in early withdrawal with some mild tremors and mild hypertension.  His last drink was yesterday.  Lipase is elevated to 151 with a CT scan demonstrating peripancreatic inflammation.  Patient reported recent melanotic stool.  No melanotic stool seen on exam.  However, his guaiac is positive.  Hemoglobin today is 8.8 down from 11.63 weeks prior.  Patient with prior history of duodenal AVMs.  Patient has been previously seen by [facility].  Additional screening labs demonstrate potassium of 2.8.  Repletion initiated in the ED  Patient would benefit from admission and treatment for his pancreatitis, alcohol withdrawal, hypokalemia, and GI bleed.  Hospitalist service aware of case and will evaluate for admission.    Final Clinical Impression(s) / ED Diagnoses Final diagnoses:  Generalized abdominal pain  Alcohol-induced acute pancreatitis, unspecified complication status  Gastrointestinal hemorrhage, unspecified gastrointestinal hemorrhage type  ETOH abuse    Rx / DC Orders ED Discharge Orders     None        Wynetta FinesMessick, Kanaya Gunnarson C, MD 09/16/20 [phone]

## 2020-09-17 DIAGNOSIS — K922 Gastrointestinal hemorrhage, unspecified: Secondary | ICD-10-CM

## 2020-09-17 DIAGNOSIS — F10231 Alcohol dependence with withdrawal delirium: Secondary | ICD-10-CM

## 2020-09-17 DIAGNOSIS — F1010 Alcohol abuse, uncomplicated: Secondary | ICD-10-CM

## 2020-09-17 LAB — CBC WITH DIFFERENTIAL/PLATELET
Band Neutrophils: 2 %
Basophils Relative: 0 %
Blasts: NONE SEEN %
Eosinophils Relative: 1 %
HCT: 27 % — ABNORMAL LOW (ref 39.0–52.0)
Hemoglobin: 8.2 g/dL — ABNORMAL LOW (ref 13.0–17.0)
Lymphocytes Relative: 4 %
MCH: 24.9 pg — ABNORMAL LOW (ref 26.0–34.0)
MCHC: 30.4 g/dL (ref 30.0–36.0)
MCV: 82.1 fL (ref 80.0–100.0)
Metamyelocytes Relative: NONE SEEN %
Monocytes Relative: 5 %
Myelocytes: NONE SEEN %
Neutrophils Relative %: 88 %
Platelets: 129 10*3/uL — ABNORMAL LOW (ref 150–400)
Promyelocytes Relative: NONE SEEN %
RBC Morphology: NORMAL
RBC: 3.29 MIL/uL — ABNORMAL LOW (ref 4.22–5.81)
RDW: 25.1 % — ABNORMAL HIGH (ref 11.5–15.5)
WBC Morphology: NORMAL
WBC: 5.1 10*3/uL (ref 4.0–10.5)
nRBC: 0 % (ref 0.0–0.2)
nRBC: NONE SEEN /100 WBC

## 2020-09-17 LAB — COMPREHENSIVE METABOLIC PANEL
ALT: 41 U/L (ref 0–44)
AST: 145 U/L — ABNORMAL HIGH (ref 15–41)
Albumin: 3.2 g/dL — ABNORMAL LOW (ref 3.5–5.0)
Alkaline Phosphatase: 101 U/L (ref 38–126)
Anion gap: 16 — ABNORMAL HIGH (ref 5–15)
BUN: 6 mg/dL (ref 6–20)
CO2: 20 mmol/L — ABNORMAL LOW (ref 22–32)
Calcium: 7.9 mg/dL — ABNORMAL LOW (ref 8.9–10.3)
Chloride: 96 mmol/L — ABNORMAL LOW (ref 98–111)
Creatinine, Ser: 0.59 mg/dL — ABNORMAL LOW (ref 0.61–1.24)
GFR, Estimated: >60 mL/min (ref >60)
Glucose, Bld: 73 mg/dL (ref 70–99)
Potassium: 3.7 mmol/L (ref 3.5–5.1)
Sodium: 132 mmol/L — ABNORMAL LOW (ref 135–145)
Total Bilirubin: 2.1 mg/dL — ABNORMAL HIGH (ref 0.3–1.2)
Total Protein: 7.5 g/dL (ref 6.5–8.1)

## 2020-09-17 LAB — C-REACTIVE PROTEIN: CRP: 4.3 mg/dL — ABNORMAL HIGH (ref <1.0)

## 2020-09-17 LAB — HEPATITIS PANEL, ACUTE
HCV Ab: NONREACTIVE
Hep A IgM: NONREACTIVE
Hep B C IgM: NONREACTIVE
Hepatitis B Surface Ag: NONREACTIVE

## 2020-09-17 LAB — MRSA NEXT GEN BY PCR, NASAL: MRSA by PCR Next Gen: NOT DETECTED

## 2020-09-17 NOTE — Progress Notes (Signed)
[facility] Progress Note  CC:  Abdominal pain, anemia, dark stools and pancreatitis   Subjective: Sedated with Ativan but arousable. He had nausea, HTN with tremors early this am, received IV and po Ativan per CIWA protocol. No further nausea or hematemesis. RN reported he passed a yellow watery diarrhea BM this am. He complains of having stomach pain and left hip pain. No CP or SOB.   Objective:  Vital signs in last 24 hours: Temp:  [98.1 F (36.7 C)-99.3 F (37.4 C)] 99.3 F (37.4 C) (07/24 0950) Pulse Rate:  [75-112] 94 (07/24 0950) Resp:  [12-23] 19 (07/24 0546) BP: (128-147)/(69-105) 134/86 (07/24 0950) SpO2:  [93 %-100 %] 94 % (07/24 0950) Weight:  [76.3 kg] 76.3 kg (07/24 0205) Last BM Date: 09/15/20 General:  Sedated but arousable when name called.  Heart: RRR, no murmur.  Pulm:  Breath sounds clear throughout.  Abdomen: Soft, nondistended. Epigastric and LUQ tenderness without rebound or guarding.  Extremities:  Without edema. Sequential stockings in use.  Neurologic:  Alert and  oriented x 4. Sedated but arousable. Answers questions appropriately.  Psych:  Alert and cooperative. Normal mood and affect.  Intake/Output from previous day: 07/23 0701 - 07/24 0700 In: 945.1 [P.O.:240; I.V.:401.6; IV Piggyback:303.5] Out: 1250 [Urine:1250] Intake/Output this shift: Total I/O In: 240 [P.O.:240] Out: -   Lab Results: Recent Labs    09/15/20 1946 09/16/20 1236 09/17/20 0204  WBC 3.8* 4.8 5.1  HGB 8.8* 8.1* 8.2*  HCT 28.2* 26.6* 27.0*  PLT 141* 127* 129*   BMET Recent Labs    09/15/20 1946 09/16/20 0852 09/17/20 0204  NA 140 136 132*  K 2.8* 2.9* 3.7  CL 101 97* 96*  CO2 26 26 20*  GLUCOSE 94 82 73  BUN '8 8 6  ' CREATININE 0.61 0.56* 0.59*  CALCIUM 8.6* 8.6* 7.9*   LFT Recent Labs    09/17/20 0204  PROT 7.5  ALBUMIN 3.2*  AST 145*  ALT 41  ALKPHOS 101  BILITOT 2.1*   PT/INR No results for input(s): LABPROT, INR in the last  72 hours. Hepatitis Panel Recent Labs    09/16/20 0924  HEPBSAG NON REACTIVE  HCVAB NON REACTIVE  HEPAIGM NON REACTIVE  HEPBIGM NON REACTIVE    CT Abdomen Pelvis W Contrast  Result Date: 09/16/2020 CLINICAL DATA:  Abdominal pain. EXAM: CT ABDOMEN AND PELVIS WITH CONTRAST TECHNIQUE: Multidetector CT imaging of the abdomen and pelvis was performed using the standard protocol following bolus administration of intravenous contrast. CONTRAST:  97m OMNIPAQUE IOHEXOL 350 MG/ML SOLN COMPARISON:  October 20, 2019 FINDINGS: Lower chest: Mild linear scarring and/or atelectasis is seen within the posteromedial aspect of the right lung base. Hepatobiliary: There is diffuse fatty infiltration of the liver parenchyma. No focal liver abnormality is seen. No gallstones, gallbladder wall thickening, or biliary dilatation. Pancreas: Mild inflammatory fat stranding is seen in between the pancreatic head and proximal duodenum. No pancreatic ductal dilatation is identified Spleen: Normal in size without focal abnormality. Adrenals/Urinary Tract: Adrenal glands are unremarkable. Kidneys are normal, without renal calculi, focal lesion, or hydronephrosis. Bladder is unremarkable. Stomach/Bowel: Stomach is within normal limits. Appendix appears normal. No evidence of bowel dilatation. Mild inflammatory fat stranding is seen in between the proximal duodenum and head of the pancreas. Vascular/Lymphatic: Aortic atherosclerosis. No enlarged abdominal or pelvic lymph nodes. Reproductive: Prostate is unremarkable. Other: No abdominal wall hernia or abnormality. No abdominopelvic ascites. Musculoskeletal: Moderate to marked severity degenerative changes are seen within  the lower lumbar spine. IMPRESSION: 1. Findings suggestive of mild acute pancreatitis. Correlation with pancreatic enzymes is recommended. 2. Given the location of the peripancreatic inflammatory fat stranding, mild duodenitis cannot be excluded. 3. Fatty liver.  Electronically Signed   By: Virgina Norfolk M.D.   On: 09/16/2020 04:24    Assessment / Plan:  56.  55 year old male with GERD, PUD (H. 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  Hg 8.8 (baseline Hg 11.6). HCT 28.2.  Iron 26.  TIBC 385.  Iron saturation ratio 7.  CTAP 08/2020 findings suggestive of mild acute pancreatitis and fatty liver.  Admission lipase 135  -> 155. alk phos 101.  AST 145.  ALT 41.  Total bili 2.1. -EGD discussed with the patient, he does not wish to purse any endoscopic evaluation  -Diet as tolerated -Continue IVF -Continue Thiamine, Folate and multi vitamin  -Continue PPI IV Q 12 hours -Ondansetron 19m po or IV Q 6 hrs PRN -CMP, lipase level in am -Transfuse for Hg < 7 -Consider IV iron during hospital admission  -Await further recommendations per Dr. AHavery Moros  2.  Alcohol use disorder. He had tremors/nausea early am received Ativan po/IV per CIWA protocol.  -No alcohol advised   3.  Hyponatremia.  Sodium 132.      Active Problems:   Acute pancreatitis   Alcohol withdrawal ([facility]ully     LOS: 1 day   CNoralyn Pick 09/17/2020, 10:12 AM

## 2020-09-17 NOTE — Progress Notes (Signed)
PROGRESS NOTE    Samuel Blevins  FAO:[id number] DOB: Dec 26, 1965 DOA: 09/15/2020 PCP: Pcp, No    Brief Narrative:  55 year old gentleman with ongoing alcohol use, smoker, history of pancreatitis and homelessness presenting to the emergency room with 4 days of abdominal pain and nausea and vomiting.  Also complained of intermittent dark tarry stool.  Increasingly weak and jittery could not find any help so came to ER.  In the emergency room hemodynamically stable.  Mildly elevated LFTs and lipase 156.  CT scan abdomen pelvis positive for pancreatitis.  Electrolyte abnormalities.  Started on Protonix and benzodiazepines and admitted to the hospital.   Assessment & Plan:   Active Problems:   Acute pancreatitis   Alcohol withdrawal (HCC)  Acute on chronic alcoholic pancreatitis: Presented with significant abdominal pain.  Overnight complains of being hungry and pain improved. Will challenge advancing to soft diet.  Monitor.  Monitor electrolytes and lipase with morning labs.  Alcoholism with alcohol withdrawal: Very high risk of alcohol withdrawal.  Currently on CIWA protocol with benzodiazepines as well as multivitamins.  Seizure precautions.  Fall precautions.  Chronic iron-deficiency anemia: Suspect alcoholic gastritis and chronic blood loss.  Seen by GI.  Currently on PPI.  Unsure he will agree with endoscopy.  Hypokalemia: Replaced with improvement.  Recheck levels tomorrow morning.  Smoker: Counseled to quit.  Less likely to quit.  On nicotine patch.   DVT prophylaxis: SCDs Start: 09/16/20 1122   Code Status: Full code Family Communication: None Disposition Plan: Status is: Inpatient  Remains inpatient appropriate because:Inpatient level of care appropriate due to severity of illness  Dispo: The patient is from:  Homeless              Anticipated d/c is to:  Shelter              Patient currently is not medically stable to d/c.   Difficult to place patient No          Consultants:  Gastroenterology  Procedures:  None  Antimicrobials:  None   Subjective: Patient seen and examined.  Early morning he had some alcohol withdrawal symptoms and was given Ativan.  He was sleepy. He told me with sleep he voiced that he needs to get out of the hospital.  He denied any nausea or vomiting or abdominal pain to me.   Objective: Vitals:   09/17/20 0330 09/17/20 0400 09/17/20 0546 09/17/20 0950  BP: (!) 141/90 137/85 133/84 134/86  Pulse: 90  92 94  Resp: 14 12 19    Temp:   98.1 F (36.7 C) 99.3 F (37.4 C)  TempSrc:   Oral Oral  SpO2: 100% 98% 99% 94%  Weight:      Height:        Intake/Output Summary (Last 24 hours) at 09/17/2020 1103 Last data filed at 09/17/2020 0950 Gross per 24 hour  Intake 1185.09 ml  Output 1250 ml  Net -64.91 ml   Filed Weights   09/17/20 0205  Weight: 76.3 kg    Examination:  General exam: Appears comfortable, flat affect, mostly sleepy. Respiratory system: Clear to auscultation. Respiratory effort normal. Cardiovascular system: S1 & S2 heard, RRR.  No added sounds.   Gastrointestinal system: Soft and nontender.  Bowel sounds present. Central nervous system: Alert and oriented. No focal neurological deficits. Extremities: Symmetric 5 x 5 power. Skin: No rashes, lesions or ulcers Psychiatry: Flat affect.  Anxious mood.    Data Reviewed: I have personally reviewed following labs and imaging  studies  CBC: Recent Labs  Lab 09/15/20 1946 09/16/20 1236 09/17/20 0204  WBC 3.8* 4.8 5.1  NEUTROABS 2.4  --   --   HGB 8.8* 8.1* 8.2*  HCT 28.2* 26.6* 27.0*  MCV 79.0* 80.1 82.1  PLT 141* 127* 129*   Basic Metabolic Panel: Recent Labs  Lab 09/15/20 1946 09/16/20 0852 09/16/20 0903 09/17/20 0204  NA 140 136  --  132*  K 2.8* 2.9*  --  3.7  CL 101 97*  --  96*  CO2 26 26  --  20*  GLUCOSE 94 82  --  73  BUN 8 8  --  6  CREATININE 0.61 0.56*  --  0.59*  CALCIUM 8.6* 8.6*  --  7.9*  MG  --  1.3*  --    --   PHOS  --   --  3.0  --    GFR: Estimated Creatinine Clearance: 103.1 mL/min (A) (by C-G formula based on SCr of 0.59 mg/dL (L)). Liver Function Tests: Recent Labs  Lab 09/15/20 1946 09/16/20 0852 09/17/20 0204  AST 181* 184* 145*  ALT 50* 49* 41  ALKPHOS 115 108 101  BILITOT 0.9 1.1 2.1*  PROT 8.9* 8.3* 7.5  ALBUMIN 3.7 3.5 3.2*   Recent Labs  Lab 09/15/20 1946  LIPASE 151*   No results for input(s): AMMONIA in the last 168 hours. Coagulation Profile: No results for input(s): INR, PROTIME in the last 168 hours. Cardiac Enzymes: No results for input(s): CKTOTAL, CKMB, CKMBINDEX, TROPONINI in the last 168 hours. BNP (last 3 results) No results for input(s): PROBNP in the last 8760 hours. HbA1C: No results for input(s): HGBA1C in the last 72 hours. CBG: No results for input(s): GLUCAP in the last 168 hours. Lipid Profile: No results for input(s): CHOL, HDL, LDLCALC, TRIG, CHOLHDL, LDLDIRECT in the last 72 hours. Thyroid Function Tests: No results for input(s): TSH, T4TOTAL, FREET4, T3FREE, THYROIDAB in the last 72 hours. Anemia Panel: Recent Labs    09/16/20 0931  TIBC 385  IRON 26*   Sepsis Labs: No results for input(s): PROCALCITON, LATICACIDVEN in the last 168 hours.  Recent Results (from the past 240 hour(s))  Resp Panel by RT-PCR (Flu A&B, Covid) Nasopharyngeal Swab     Status: None   Collection Time: 09/16/20  8:52 AM   Specimen: Nasopharyngeal Swab; Nasopharyngeal(NP) swabs in vial transport medium  Result Value Ref Range Status   SARS Coronavirus 2 by RT PCR NEGATIVE NEGATIVE Final    Comment: (NOTE) SARS-CoV-2 target nucleic acids are NOT DETECTED.  The SARS-CoV-2 RNA is generally detectable in upper respiratory specimens during the acute phase of infection. The lowest concentration of SARS-CoV-2 viral copies this assay can detect is 138 copies/mL. A negative result does not preclude SARS-Cov-2 infection and should not be used as the sole  basis for treatment or other patient management decisions. A negative result may occur with  improper specimen collection/handling, submission of specimen other than nasopharyngeal swab, presence of viral mutation(s) within the areas targeted by this assay, and inadequate number of viral copies(<138 copies/mL). A negative result must be combined with clinical observations, patient history, and epidemiological information. The expected result is Negative.  Fact Sheet for Patients:  BloggerCourse.com  Fact Sheet for Healthcare Providers:  SeriousBroker.it  This test is no t yet approved or cleared by the [place] FDA and  has been authorized for detection and/or diagnosis of SARS-CoV-2 by FDA under an Emergency Use Authorization (EUA). This EUA will  remain  in effect (meaning this test can be used) for the duration of the COVID-19 declaration under Section 564(b)(1) of the Act, 21 U.S.C.section 360bbb-3(b)(1), unless the authorization is terminated  or revoked sooner.       Influenza A by PCR NEGATIVE NEGATIVE Final   Influenza B by PCR NEGATIVE NEGATIVE Final    Comment: (NOTE) The Xpert Xpress SARS-CoV-2/FLU/RSV plus assay is intended as an aid in the diagnosis of influenza from Nasopharyngeal swab specimens and should not be used as a sole basis for treatment. Nasal washings and aspirates are unacceptable for Xpert Xpress SARS-CoV-2/FLU/RSV testing.  Fact Sheet for Patients: BloggerCourse.com  Fact Sheet for Healthcare Providers: SeriousBroker.it  This test is not yet approved or cleared by the [place] FDA and has been authorized for detection and/or diagnosis of SARS-CoV-2 by FDA under an Emergency Use Authorization (EUA). This EUA will remain in effect (meaning this test can be used) for the duration of the COVID-19 declaration under Section 564(b)(1) of the Act,  21 U.S.C. section 360bbb-3(b)(1), unless the authorization is terminated or revoked.  Performed at [facility], [address] [place], [place] [address]   MRSA Next Gen by PCR, Nasal     Status: None   Collection Time: 09/17/20  1:58 AM   Specimen: Nasal Mucosa; Nasal Swab  Result Value Ref Range Status   MRSA by PCR Next Gen NOT DETECTED NOT DETECTED Final    Comment: (NOTE) The GeneXpert MRSA Assay (FDA approved for NASAL specimens only), is one component of a comprehensive MRSA colonization surveillance program. It is not intended to diagnose MRSA infection nor to guide or monitor treatment for MRSA infections. Test performance is not FDA approved in patients less than 54 years old. Performed at [facility], [address], [place], [place] [address]          Radiology Studies: CT Abdomen Pelvis W Contrast  Result Date: 09/16/2020 CLINICAL DATA:  Abdominal pain. EXAM: CT ABDOMEN AND PELVIS WITH CONTRAST TECHNIQUE: Multidetector CT imaging of the abdomen and pelvis was performed using the standard protocol following bolus administration of intravenous contrast. CONTRAST:  44mL OMNIPAQUE IOHEXOL 350 MG/ML SOLN COMPARISON:  October 20, 2019 FINDINGS: Lower chest: Mild linear scarring and/or atelectasis is seen within the posteromedial aspect of the right lung base. Hepatobiliary: There is diffuse fatty infiltration of the liver parenchyma. No focal liver abnormality is seen. No gallstones, gallbladder wall thickening, or biliary dilatation. Pancreas: Mild inflammatory fat stranding is seen in between the pancreatic head and proximal duodenum. No pancreatic ductal dilatation is identified Spleen: Normal in size without focal abnormality. Adrenals/Urinary Tract: Adrenal glands are unremarkable. Kidneys are normal, without renal calculi, focal lesion, or hydronephrosis. Bladder is unremarkable. Stomach/Bowel: Stomach is within normal limits.  Appendix appears normal. No evidence of bowel dilatation. Mild inflammatory fat stranding is seen in between the proximal duodenum and head of the pancreas. Vascular/Lymphatic: Aortic atherosclerosis. No enlarged abdominal or pelvic lymph nodes. Reproductive: Prostate is unremarkable. Other: No abdominal wall hernia or abnormality. No abdominopelvic ascites. Musculoskeletal: Moderate to marked severity degenerative changes are seen within the lower lumbar spine. IMPRESSION: 1. Findings suggestive of mild acute pancreatitis. Correlation with pancreatic enzymes is recommended. 2. Given the location of the peripancreatic inflammatory fat stranding, mild duodenitis cannot be excluded. 3. Fatty liver. Electronically Signed   By: Aram Candela M.D.   On: 09/16/2020 04:24        Scheduled Meds:  folic acid  1  mg Oral Daily   LORazepam  0-4 mg Intravenous Q6H   Followed by   Melene Muller[START ON 09/18/2020] LORazepam  0-4 mg Intravenous Q12H   multivitamin with minerals  1 tablet Oral Daily   nicotine  14 mg Transdermal Daily   pantoprazole (PROTONIX) IV  40 mg Intravenous Q12H   thiamine  100 mg Oral Daily   Or   thiamine  100 mg Intravenous Daily   Continuous Infusions:  sodium chloride 100 mL/hr at 09/17/20 0557     LOS: 1 day    Time spent: 30 minutes    Dorcas CarrowKuber Jheri Mitter, MD [facility] Pager [phone]

## 2020-09-17 NOTE — Discharge Summary (Signed)
I was called by nursing staff to the bedside because patient started dressing up and wanted to leave.  I went to talk to the patient.  He was slightly tremulous but was able to express his wishes.  He tells me that we are not going to make him any different person, he ate his lunch and he would like to get out of the hospital.  Patient is alert oriented and able to understand consequences of his medical decisions.  He does not qualify for involuntary commitment because he can make his own healthcare decisions.  I offered him medications, more food and advised him to stay in the hospital, however he left AGAINST MEDICAL ADVICE.  Patient is high risk of readmission due to ongoing alcoholism, he is also at high risk of getting alcohol related complications with ongoing use, fall and injuries.

## 2020-09-17 NOTE — Progress Notes (Signed)
2902 RN called to room, pt stated he was ready to leave. Instructed pt that MD will round this AM and will let him know if he can be discharged. RN instructed pt to go back to sleep, pt resting comfortably, fall precautions in place, WCTM.

## 2020-09-17 NOTE — Progress Notes (Signed)
Went in to the room after secretary called about the patient needing assistance with his IV. When I came into the room the patient was trying to exit the bed while pulling off his telemetry leads. States that he is leaving, that he no longer has pain, that we cannot keep him here and he is ready to leave. Dr. Jerral Ralph was notified as well as the charge nurse, Aundra Millet.   Aundra Millet came into the room and we both encouraged the patient to stay, he refused. The doctor came in and tried to do the same, but again he refused. Patient answered all orientation questions appropriately and the doctor said he is free to go then as we cannot force him to stay. IV was removed as well as condom cath. Patient was dressed, signed out AMA, and was escorted to the door by wheelchair,

## 2020-10-03 ENCOUNTER — Encounter (HOSPITAL_COMMUNITY): Payer: Self-pay | Admitting: Emergency Medicine

## 2020-10-03 ENCOUNTER — Emergency Department (HOSPITAL_COMMUNITY): Payer: Self-pay

## 2020-10-03 ENCOUNTER — Emergency Department (HOSPITAL_COMMUNITY)
Admission: EM | Admit: 2020-10-03 | Discharge: 2020-10-03 | Disposition: A | Discharge status: Home / Self Care | Payer: Self-pay | Attending: Emergency Medicine | Admitting: Emergency Medicine

## 2020-10-03 DIAGNOSIS — M25551 Pain in right hip: Secondary | ICD-10-CM | POA: Insufficient documentation

## 2020-10-03 DIAGNOSIS — F17210 Nicotine dependence, cigarettes, uncomplicated: Secondary | ICD-10-CM | POA: Insufficient documentation

## 2020-10-03 DIAGNOSIS — I10 Essential (primary) hypertension: Secondary | ICD-10-CM | POA: Insufficient documentation

## 2020-10-03 DIAGNOSIS — M25552 Pain in left hip: Secondary | ICD-10-CM | POA: Insufficient documentation

## 2020-10-03 DIAGNOSIS — K860 Alcohol-induced chronic pancreatitis: Secondary | ICD-10-CM | POA: Insufficient documentation

## 2020-10-03 DIAGNOSIS — F1020 Alcohol dependence, uncomplicated: Secondary | ICD-10-CM | POA: Insufficient documentation

## 2020-10-03 DIAGNOSIS — W19XXXA Unspecified fall, initial encounter: Secondary | ICD-10-CM

## 2020-10-03 DIAGNOSIS — R053 Chronic cough: Secondary | ICD-10-CM | POA: Insufficient documentation

## 2020-10-03 LAB — URINALYSIS, ROUTINE W REFLEX MICROSCOPIC
Bacteria, UA: NONE SEEN
Glucose, UA: NEGATIVE mg/dL
Hgb urine dipstick: NEGATIVE
Ketones, ur: 5 mg/dL — AB
Leukocytes,Ua: NEGATIVE
Nitrite: NEGATIVE
Protein, ur: 100 mg/dL — AB
Specific Gravity, Urine: 1.027 (ref 1.005–1.030)
pH: 5 (ref 5.0–8.0)

## 2020-10-03 LAB — CBC
HCT: 29.7 % — ABNORMAL LOW (ref 39.0–52.0)
Hemoglobin: 9 g/dL — ABNORMAL LOW (ref 13.0–17.0)
MCH: 24.9 pg — ABNORMAL LOW (ref 26.0–34.0)
MCHC: 30.3 g/dL (ref 30.0–36.0)
MCV: 82 fL (ref 80.0–100.0)
Platelets: 140 10*3/uL — ABNORMAL LOW (ref 150–400)
RBC: 3.62 MIL/uL — ABNORMAL LOW (ref 4.22–5.81)
RDW: 23.6 % — ABNORMAL HIGH (ref 11.5–15.5)
WBC: 2.5 10*3/uL — ABNORMAL LOW (ref 4.0–10.5)
nRBC: 0 % (ref 0.0–0.2)

## 2020-10-03 LAB — COMPREHENSIVE METABOLIC PANEL
ALT: 61 U/L — ABNORMAL HIGH (ref 0–44)
AST: 183 U/L — ABNORMAL HIGH (ref 15–41)
Albumin: 3.6 g/dL (ref 3.5–5.0)
Alkaline Phosphatase: 112 U/L (ref 38–126)
Anion gap: 15 (ref 5–15)
BUN: 13 mg/dL (ref 6–20)
CO2: 23 mmol/L (ref 22–32)
Calcium: 9.4 mg/dL (ref 8.9–10.3)
Chloride: 103 mmol/L (ref 98–111)
Creatinine, Ser: 1.17 mg/dL (ref 0.61–1.24)
GFR, Estimated: >60 mL/min (ref >60)
Glucose, Bld: 110 mg/dL — ABNORMAL HIGH (ref 70–99)
Potassium: 3.1 mmol/L — ABNORMAL LOW (ref 3.5–5.1)
Sodium: 141 mmol/L (ref 135–145)
Total Bilirubin: 0.9 mg/dL (ref 0.3–1.2)
Total Protein: 8.9 g/dL — ABNORMAL HIGH (ref 6.5–8.1)

## 2020-10-03 LAB — LIPASE, BLOOD: Lipase: 151 U/L — ABNORMAL HIGH (ref 11–51)

## 2020-10-03 LAB — MAGNESIUM: Magnesium: 1.7 mg/dL (ref 1.7–2.4)

## 2020-10-03 MED ORDER — SODIUM CHLORIDE 0.9 % IV BOLUS
1000.0000 mL | Freq: Once | INTRAVENOUS | Status: AC
  Administered 2020-10-03: 1000 mL via INTRAVENOUS

## 2020-10-03 MED ORDER — POTASSIUM CHLORIDE CRYS ER 20 MEQ PO TBCR
40.0000 meq | EXTENDED_RELEASE_TABLET | Freq: Once | ORAL | Status: AC
  Administered 2020-10-03: 40 meq via ORAL
  Filled 2020-10-03: qty 2

## 2020-10-03 MED ORDER — MAGNESIUM SULFATE 2 GM/50ML IV SOLN
2.0000 g | Freq: Once | INTRAVENOUS | Status: AC
  Administered 2020-10-03: 2 g via INTRAVENOUS
  Filled 2020-10-03: qty 50

## 2020-10-03 NOTE — ED Triage Notes (Signed)
Patient here via EMS reporting abd pain, n/v for 2 weeks. Hx of pancreatitis and alcoholism.

## 2020-10-03 NOTE — ED Notes (Signed)
Pt ambulatory in ED lobby. 

## 2020-10-03 NOTE — ED Provider Notes (Signed)
[facility]-EMERGENCY DEPT Provider Note   CSN: [id number] Arrival date & time: 10/03/20  1831     History Chief Complaint  Patient presents with   Abdominal Pain   Nausea    Samuel Blevins is a 55 y.o. male.  HPI He presents for evaluation of multiple concerns including ongoing pain in abdomen and hips, continued drinking with irritation of his pancreas, fall with injury to hips, vomiting with some blood in it, chronic cough, with tobacco abuse.  Recently hospitalized and left AMA after 2 days, with partial treatment for pancreatitis.  He is homeless.  He drinks alcohol.  Denies use of illegal drugs.  He is a cigarette smoker.  There are no other known active modifying factors.    Past Medical History:  Diagnosis Date   Alcohol withdrawal syndrome with complication (HCC)    Alcoholism (HCC)    Elevated AST (SGOT)    Homeless    Hypertension    Symptomatic anemia 11/23/2015    Patient Active Problem List   Diagnosis Date Noted   Gastrointestinal hemorrhage    Acute pancreatitis 09/16/2020   Alcohol withdrawal (HCC) 09/16/2020   Chronic pain syndrome 12/28/2019   Pancreatic pseudocyst 10/28/2019   Pancreatitis, acute 10/28/2019   Gastritis and gastroduodenitis    AVM (arteriovenous malformation) of small bowel, acquired    ETOH abuse 06/21/2019   Tobacco abuse 03/17/2014   Essential hypertension 03/17/2014    Past Surgical History:  Procedure Laterality Date   BIOPSY  06/23/2019   Procedure: BIOPSY;  Surgeon: Shellia Cleverly, DO;  Location: [facility] ENDOSCOPY;  Service: Gastroenterology;;   ESOPHAGOGASTRODUODENOSCOPY (EGD) WITH PROPOFOL N/A 06/23/2019   Procedure: ESOPHAGOGASTRODUODENOSCOPY (EGD) WITH PROPOFOL;  Surgeon: Shellia Cleverly, DO;  Location: [facility] ENDOSCOPY;  Service: Gastroenterology;  Laterality: N/A;   HOT HEMOSTASIS N/A 06/23/2019   Procedure: HOT HEMOSTASIS (ARGON PLASMA COAGULATION/BICAP);  Surgeon: Shellia Cleverly, DO;  Location: [facility]  ENDOSCOPY;  Service: Gastroenterology;  Laterality: N/A;       Family History  Problem Relation Age of Onset   Diabetes Mellitus II Neg Hx    Colon cancer Neg Hx    Stomach cancer Neg Hx    Pancreatic cancer Neg Hx     Social History   Tobacco Use   Smoking status: Every Day    Packs/day: 1.00    Years: 30.00    Pack years: 30.00    Types: Cigarettes   Smokeless tobacco: Never  Vaping Use   Vaping Use: Never used  Substance Use Topics   Alcohol use: Yes   Drug use: Never    Home Medications Prior to Admission medications   Medication Sig Start Date End Date Taking? Authorizing Provider  citalopram (CELEXA) 20 MG tablet TAKE 1 TABLET (20 MG TOTAL) BY MOUTH DAILY. Patient not taking: Reported on 09/16/2020 02/02/20 02/01/21  [name], MD  folic acid (FOLVITE) 1 MG tablet Take 1 tablet (1 mg total) by mouth daily. Patient not taking: Reported on 09/16/2020 05/03/20   [name], MD  omeprazole (PRILOSEC) 40 MG capsule Take 1 capsule (40 mg total) by mouth 2 (two) times daily before a meal. Patient not taking: Reported on 09/16/2020 12/15/19   [name], MD  ondansetron (ZOFRAN ODT) 8 MG disintegrating tablet Take 1 tablet (8 mg total) by mouth every 8 (eight) hours as needed for nausea or vomiting. Patient not taking: Reported on 09/16/2020 05/03/20   [name], MD  thiamine 100 MG  tablet Take 1 tablet (100 mg total) by mouth daily. Patient not taking: Reported on 09/16/2020 05/03/20   Storm FriskWright, Patrick E, MD    Allergies    Patient has no known allergies.  Review of Systems   Review of Systems  All other systems reviewed and are negative.  Physical Exam Updated Vital Signs BP 135/90   Pulse 85   Temp 99.1 F (37.3 C) (Oral)   Resp 13   SpO2 95%   Physical Exam Vitals and nursing note reviewed.  Constitutional:      General: He is not in acute distress.    Appearance: He is well-developed. He is not ill-appearing, toxic-appearing or  diaphoretic.     Comments: Disheveled  HENT:     Head: Normocephalic and atraumatic.     Right Ear: External ear normal.     Left Ear: External ear normal.  Eyes:     Conjunctiva/sclera: Conjunctivae normal.     Pupils: Pupils are equal, round, and reactive to light.  Neck:     Trachea: Phonation normal.  Cardiovascular:     Rate and Rhythm: Normal rate.  Pulmonary:     Effort: Pulmonary effort is normal.  Abdominal:     General: There is no distension.     Palpations: Abdomen is soft.     Tenderness: There is no abdominal tenderness.  Musculoskeletal:        General: No swelling or tenderness. Normal range of motion.     Cervical back: Normal range of motion and neck supple.  Skin:    General: Skin is warm and dry.  Neurological:     Mental Status: He is alert and oriented to person, place, and time.     Cranial Nerves: No cranial nerve deficit.     Sensory: No sensory deficit.     Motor: No abnormal muscle tone.     Coordination: Coordination normal.  Psychiatric:        Mood and Affect: Mood normal.        Behavior: Behavior normal.        Thought Content: Thought content normal.        Judgment: Judgment normal.    ED Results / Procedures / Treatments   Labs (all labs ordered are listed, but only abnormal results are displayed) Labs Reviewed  LIPASE, BLOOD - Abnormal; Notable for the following components:      Result Value   Lipase 151 (*)    All other components within normal limits  COMPREHENSIVE METABOLIC PANEL - Abnormal; Notable for the following components:   Potassium 3.1 (*)    Glucose, Bld 110 (*)    Total Protein 8.9 (*)    AST 183 (*)    ALT 61 (*)    All other components within normal limits  CBC - Abnormal; Notable for the following components:   WBC 2.5 (*)    RBC 3.62 (*)    Hemoglobin 9.0 (*)    HCT 29.7 (*)    MCH 24.9 (*)    RDW 23.6 (*)    Platelets 140 (*)    All other components within normal limits  URINALYSIS, ROUTINE W REFLEX  MICROSCOPIC - Abnormal; Notable for the following components:   Color, Urine AMBER (*)    APPearance HAZY (*)    Bilirubin Urine SMALL (*)    Ketones, ur 5 (*)    Protein, ur 100 (*)    All other components within normal limits  MAGNESIUM  EKG None  Radiology DG Chest 2 View  Result Date: 10/03/2020 CLINICAL DATA:  Shortness of breath and cough EXAM: CHEST - 2 VIEW COMPARISON:  11/22/2015 FINDINGS: Cardiac shadow is within normal limits. The lungs are well aerated bilaterally. Right basilar opacity is noted. No sizable effusion is seen. No bony abnormality is noted. IMPRESSION: Right basilar opacity likely representing early infiltrate or atelectasis. Electronically Signed   By: Alcide Clever M.D.   On: 10/03/2020 22:07   DG Pelvis 1-2 Views  Result Date: 10/03/2020 CLINICAL DATA:  Bilateral hip pain, no known injury, initial encounter EXAM: PELVIS - 2 VIEW COMPARISON:  None. FINDINGS: Pelvic ring is intact. No acute fracture or dislocation is noted. No soft tissue abnormality is seen. IMPRESSION: No acute abnormality noted. Electronically Signed   By: Alcide Clever M.D.   On: 10/03/2020 22:08    Procedures Procedures   Medications Ordered in ED Medications  magnesium sulfate IVPB 2 g 50 mL (2 g Intravenous New Bag/Given 10/03/20 2223)  sodium chloride 0.9 % bolus 1,000 mL (1,000 mLs Intravenous New Bag/Given 10/03/20 2223)  potassium chloride SA (KLOR-CON) CR tablet 40 mEq (40 mEq Oral Given 10/03/20 2224)    ED Course  I have reviewed the triage vital signs and the nursing notes.  Pertinent labs & imaging results that were available during my care of the patient were reviewed by me and considered in my medical decision making (see chart for details).    MDM Rules/Calculators/A&P                            Patient Vitals for the past 24 hrs:  BP Temp Temp src Pulse Resp SpO2  10/03/20 2230 135/90 -- -- 85 13 95 %  10/03/20 2200 128/83 -- -- 100 -- (!) 88 %  10/03/20 2130 (!)  141/84 -- -- (!) 112 (!) 22 95 %  10/03/20 2100 133/88 -- -- (!) 101 18 94 %  10/03/20 2035 -- -- -- (!) 106 14 93 %  10/03/20 2030 129/74 -- -- 100 -- 90 %  10/03/20 2023 126/81 -- -- (!) 106 20 92 %  10/03/20 1851 103/68 99.1 F (37.3 C) Oral (!) 124 20 96 %    9:56 PM Reevaluation with update and discussion. After initial assessment and treatment, an updated evaluation reveals  Samuel Blevins   Medical Decision Making:  This patient is presenting for evaluation of ongoing multiple symptoms, which does require a range of treatment options, and is a complaint that involves a moderate risk of morbidity and mortality. The differential diagnoses include chronic pancreatitis, acute pancreatitis, homelessness, alcoholism, metabolic disorders. I decided to review old records, and in summary millage male who is homeless, and drinks heavily, presenting for recurrent symptoms.  I did not require additional historical information from anyone.  Clinical Laboratory Tests Ordered, included CBC, Metabolic panel, and lipase, magnesium, urinalysis . Review indicates normal except lipase high, potassium low, glucose high, total protein high, AST high, ALT high, white count low, hemoglobin low, urinalysis with bilirubin, ketones and protein. Radiologic Tests Ordered, included pelvis, chest x-ray.  I independently Visualized: Radiographic images, which show no fracture of the pelvis.  Atelectasis versus infiltrate, right lower lobe.   Critical Interventions-clinical evaluation, laboratory testing, medication treatment, observation reassess  After These Interventions, the Patient was reevaluated and was found stable with normal vital signs.  No indication for hospitalization.  Chronic elevated lipase, with nonspecific complaints today.  Doubt acute pneumonia.  Ongoing chronic mild electrolyte abnormalities and anemia.  No indication for hospitalization or further ED intervention.  Patient has ongoing chronic  alcoholism.  CRITICAL CARE-no Performed by: Samuel Blevins  Nursing Notes Reviewed/ Care Coordinated Applicable Imaging Reviewed Interpretation of Laboratory Data incorporated into ED treatment  The patient appears reasonably screened and/or stabilized for discharge and I doubt any other medical condition or other [facility] requiring further screening, evaluation, or treatment in the ED at this time prior to discharge.  Plan: Home Medications-OTC medications of choice; Home Treatments-gradually increase activity, regular diet; return here if the recommended treatment, does not improve the symptoms; Recommended follow up-PCP, PRN     Final Clinical Impression(s) / ED Diagnoses Final diagnoses:  Alcoholism (HCC)  Fall, initial encounter  Alcohol-induced chronic pancreatitis [facility])    Rx / DC Orders ED Discharge Orders     None        Samuel Bale, MD 10/03/20 2323

## 2020-10-03 NOTE — Discharge Instructions (Addendum)
Try to stop drinking alcohol which should help your pancreas and abdominal pain get better.  Use Tylenol or Motrin for pain.  Call the GI doctor, for further evaluation with endoscopy if you desire

## 2020-10-26 ENCOUNTER — Ambulatory Visit: Payer: Self-pay | Admitting: Critical Care Medicine

## 2020-11-20 ENCOUNTER — Encounter (HOSPITAL_COMMUNITY): Payer: Self-pay | Admitting: Emergency Medicine

## 2020-11-20 ENCOUNTER — Emergency Department (HOSPITAL_COMMUNITY)
Admission: EM | Admit: 2020-11-20 | Discharge: 2020-11-21 | Discharge status: Against Medical Advice | Payer: Self-pay | Attending: Emergency Medicine | Admitting: Emergency Medicine

## 2020-11-20 ENCOUNTER — Other Ambulatory Visit: Payer: Self-pay

## 2020-11-20 DIAGNOSIS — R040 Epistaxis: Secondary | ICD-10-CM | POA: Insufficient documentation

## 2020-11-20 DIAGNOSIS — M25551 Pain in right hip: Secondary | ICD-10-CM | POA: Insufficient documentation

## 2020-11-20 DIAGNOSIS — S60862A Insect bite (nonvenomous) of left wrist, initial encounter: Secondary | ICD-10-CM | POA: Insufficient documentation

## 2020-11-20 DIAGNOSIS — W57XXXA Bitten or stung by nonvenomous insect and other nonvenomous arthropods, initial encounter: Secondary | ICD-10-CM | POA: Insufficient documentation

## 2020-11-20 DIAGNOSIS — M25552 Pain in left hip: Secondary | ICD-10-CM | POA: Insufficient documentation

## 2020-11-20 NOTE — ED Notes (Signed)
Pt called 2x answer

## 2020-11-20 NOTE — ED Triage Notes (Signed)
Patient here with complaint of what he believes is a spider bite on his left wrist that occurred one day ago. Patient states he is homeless and sleeps on the street so is unsure what bit him. Patient also complains of epistaxis x2 weeks and bilateral hip pain for several months. Patient reports history of alcoholism, states he drinks approximately one fifth-gallon of liquor each day, has had approximately one half-pint of liquor today. Patient alert, oriented, ambulatory, and in no apparent distress at this time.

## 2020-12-03 ENCOUNTER — Other Ambulatory Visit: Payer: Self-pay

## 2020-12-03 ENCOUNTER — Emergency Department (HOSPITAL_COMMUNITY)
Admission: EM | Admit: 2020-12-03 | Discharge: 2020-12-03 | Disposition: A | Discharge status: Home / Self Care | Payer: Self-pay | Attending: Emergency Medicine | Admitting: Emergency Medicine

## 2020-12-03 DIAGNOSIS — Z5321 Procedure and treatment not carried out due to patient leaving prior to being seen by health care provider: Secondary | ICD-10-CM | POA: Insufficient documentation

## 2020-12-03 DIAGNOSIS — R1030 Lower abdominal pain, unspecified: Secondary | ICD-10-CM | POA: Insufficient documentation

## 2020-12-03 DIAGNOSIS — R195 Other fecal abnormalities: Secondary | ICD-10-CM | POA: Insufficient documentation

## 2020-12-03 DIAGNOSIS — R079 Chest pain, unspecified: Secondary | ICD-10-CM | POA: Insufficient documentation

## 2020-12-03 LAB — CBC WITH DIFFERENTIAL/PLATELET
Abs Immature Granulocytes: 0.04 10*3/uL (ref 0.00–0.07)
Basophils Absolute: 0 10*3/uL (ref 0.0–0.1)
Basophils Relative: 1 %
Eosinophils Absolute: 0 10*3/uL (ref 0.0–0.5)
Eosinophils Relative: 0 %
HCT: 27.3 % — ABNORMAL LOW (ref 39.0–52.0)
Hemoglobin: 8.6 g/dL — ABNORMAL LOW (ref 13.0–17.0)
Immature Granulocytes: 1 %
Lymphocytes Relative: 13 %
Lymphs Abs: 0.8 10*3/uL (ref 0.7–4.0)
MCH: 25.6 pg — ABNORMAL LOW (ref 26.0–34.0)
MCHC: 31.5 g/dL (ref 30.0–36.0)
MCV: 81.3 fL (ref 80.0–100.0)
Monocytes Absolute: 0.8 10*3/uL (ref 0.1–1.0)
Monocytes Relative: 14 %
Neutro Abs: 4.2 10*3/uL (ref 1.7–7.7)
Neutrophils Relative %: 71 %
Platelets: 168 10*3/uL (ref 150–400)
RBC: 3.36 MIL/uL — ABNORMAL LOW (ref 4.22–5.81)
RDW: 18.3 % — ABNORMAL HIGH (ref 11.5–15.5)
WBC: 5.9 10*3/uL (ref 4.0–10.5)
nRBC: 0 % (ref 0.0–0.2)

## 2020-12-03 LAB — COMPREHENSIVE METABOLIC PANEL
ALT: 33 U/L (ref 0–44)
AST: 80 U/L — ABNORMAL HIGH (ref 15–41)
Albumin: 3.5 g/dL (ref 3.5–5.0)
Alkaline Phosphatase: 99 U/L (ref 38–126)
Anion gap: 14 (ref 5–15)
BUN: 5 mg/dL — ABNORMAL LOW (ref 6–20)
CO2: 26 mmol/L (ref 22–32)
Calcium: 8.7 mg/dL — ABNORMAL LOW (ref 8.9–10.3)
Chloride: 92 mmol/L — ABNORMAL LOW (ref 98–111)
Creatinine, Ser: 0.82 mg/dL (ref 0.61–1.24)
GFR, Estimated: >60 mL/min (ref >60)
Glucose, Bld: 95 mg/dL (ref 70–99)
Potassium: 2.8 mmol/L — ABNORMAL LOW (ref 3.5–5.1)
Sodium: 132 mmol/L — ABNORMAL LOW (ref 135–145)
Total Bilirubin: 1.5 mg/dL — ABNORMAL HIGH (ref 0.3–1.2)
Total Protein: 8.3 g/dL — ABNORMAL HIGH (ref 6.5–8.1)

## 2020-12-03 LAB — ETHANOL: Alcohol, Ethyl (B): 256 mg/dL — ABNORMAL HIGH (ref <10)

## 2020-12-03 LAB — PROTIME-INR
INR: 1 (ref 0.8–1.2)
Prothrombin Time: 12.8 seconds (ref 11.4–15.2)

## 2020-12-03 LAB — LIPASE, BLOOD: Lipase: 180 U/L — ABNORMAL HIGH (ref 11–51)

## 2020-12-03 NOTE — ED Triage Notes (Signed)
Pt c/o chest pain and lower abdominal pain. CP centralized and improved with ETOH intake. Pt states daily ETOH, states if he had another drink he wouldn't be hurting today. Pt reports black tarry stools x2 months. Last drink 2 hours PTA. Normally drink 2/5ths of vodka. Didn't have enough money to get vodka today.

## 2020-12-03 NOTE — ED Notes (Signed)
Called for vitals no answer °

## 2020-12-03 NOTE — ED Provider Notes (Signed)
Emergency Medicine Provider Triage Evaluation Note  Samuel Blevins , a 55 y.o. male  was evaluated in triage.  Pt complains of abdominal pain.  The patient is having abdominal and chest pain.  Pain seems to improve with alcohol use.  Patient reports significant alcohol use daily.  He typically drinks two fifths of Vodka daily.  Unfortunately though, he did not have enough money to buy vodka today, which is when his pain started to increase.  He also adds that he has been having black, tarry stools for the last 2 months.  No syncope or seizure-like activity.  No vomiting.  No fever or chills.  Review of Systems  Positive: Abdominal pain, chest pain, melena Negative: Syncope, seizure-like activity, vomiting, fever, chills  Physical Exam  BP 129/81 (BP Location: Right Arm)   Pulse 94   Temp 99.3 F (37.4 C) (Oral)   Resp 20   Ht 5\' 9"  (1.753 m)   Wt 76.7 kg   SpO2 97%   BMI 24.96 kg/m  Gen:   Awake, no distress   Resp:  Normal effort  MSK:   Moves extremities without difficulty  Other:  Tender to palpation to the abdomen  Medical Decision Making  Medically screening exam initiated at 2:22 AM.  Appropriate orders placed.  Cindy Brindisi was informed that the remainder of the evaluation will be completed by another provider, this initial triage assessment does not replace that evaluation, and the importance of remaining in the ED until their evaluation is complete.  Labs and imaging have been ordered.  He will require further work-up and evaluation in the emergency department.   [name] A, PA-C 12/03/20 0555    02/02/21, MD 12/04/20 [id number]

## 2020-12-04 ENCOUNTER — Emergency Department (HOSPITAL_COMMUNITY): Payer: Self-pay

## 2020-12-04 ENCOUNTER — Encounter (HOSPITAL_COMMUNITY): Payer: Self-pay

## 2020-12-04 ENCOUNTER — Other Ambulatory Visit: Payer: Self-pay

## 2020-12-04 ENCOUNTER — Inpatient Hospital Stay (HOSPITAL_COMMUNITY)
Admission: EM | Admit: 2020-12-04 | Discharge: 2020-12-06 | DRG: 894 | Discharge status: Against Medical Advice | Payer: Self-pay | Attending: Internal Medicine | Admitting: Internal Medicine

## 2020-12-04 DIAGNOSIS — A419 Sepsis, unspecified organism: Secondary | ICD-10-CM

## 2020-12-04 DIAGNOSIS — Y908 Blood alcohol level of 240 mg/100 ml or more: Secondary | ICD-10-CM | POA: Diagnosis present

## 2020-12-04 DIAGNOSIS — D509 Iron deficiency anemia, unspecified: Secondary | ICD-10-CM | POA: Diagnosis present

## 2020-12-04 DIAGNOSIS — K759 Inflammatory liver disease, unspecified: Secondary | ICD-10-CM | POA: Diagnosis present

## 2020-12-04 DIAGNOSIS — G894 Chronic pain syndrome: Secondary | ICD-10-CM | POA: Diagnosis present

## 2020-12-04 DIAGNOSIS — R569 Unspecified convulsions: Secondary | ICD-10-CM

## 2020-12-04 DIAGNOSIS — F17210 Nicotine dependence, cigarettes, uncomplicated: Secondary | ICD-10-CM | POA: Diagnosis present

## 2020-12-04 DIAGNOSIS — F10239 Alcohol dependence with withdrawal, unspecified: Principal | ICD-10-CM | POA: Diagnosis present

## 2020-12-04 DIAGNOSIS — E441 Mild protein-calorie malnutrition: Secondary | ICD-10-CM | POA: Diagnosis present

## 2020-12-04 DIAGNOSIS — I10 Essential (primary) hypertension: Secondary | ICD-10-CM | POA: Diagnosis present

## 2020-12-04 DIAGNOSIS — I700 Atherosclerosis of aorta: Secondary | ICD-10-CM | POA: Diagnosis present

## 2020-12-04 DIAGNOSIS — K760 Fatty (change of) liver, not elsewhere classified: Secondary | ICD-10-CM | POA: Diagnosis present

## 2020-12-04 DIAGNOSIS — E46 Unspecified protein-calorie malnutrition: Secondary | ICD-10-CM | POA: Diagnosis present

## 2020-12-04 DIAGNOSIS — R4182 Altered mental status, unspecified: Secondary | ICD-10-CM

## 2020-12-04 DIAGNOSIS — D649 Anemia, unspecified: Secondary | ICD-10-CM

## 2020-12-04 DIAGNOSIS — R7989 Other specified abnormal findings of blood chemistry: Secondary | ICD-10-CM

## 2020-12-04 DIAGNOSIS — Z20822 Contact with and (suspected) exposure to covid-19: Secondary | ICD-10-CM | POA: Diagnosis present

## 2020-12-04 DIAGNOSIS — F1020 Alcohol dependence, uncomplicated: Secondary | ICD-10-CM | POA: Diagnosis present

## 2020-12-04 DIAGNOSIS — R509 Fever, unspecified: Secondary | ICD-10-CM

## 2020-12-04 DIAGNOSIS — Z5900 Homelessness unspecified: Secondary | ICD-10-CM

## 2020-12-04 DIAGNOSIS — E876 Hypokalemia: Secondary | ICD-10-CM | POA: Diagnosis present

## 2020-12-04 DIAGNOSIS — Z9114 Patient's other noncompliance with medication regimen: Secondary | ICD-10-CM

## 2020-12-04 DIAGNOSIS — Z6824 Body mass index (BMI) 24.0-24.9, adult: Secondary | ICD-10-CM

## 2020-12-04 DIAGNOSIS — E872 Acidosis, unspecified: Secondary | ICD-10-CM | POA: Diagnosis present

## 2020-12-04 LAB — CBC WITH DIFFERENTIAL/PLATELET
Abs Immature Granulocytes: 0.12 10*3/uL — ABNORMAL HIGH (ref 0.00–0.07)
Basophils Absolute: 0 10*3/uL (ref 0.0–0.1)
Basophils Relative: 0 %
Eosinophils Absolute: 0 10*3/uL (ref 0.0–0.5)
Eosinophils Relative: 0 %
HCT: 26.3 % — ABNORMAL LOW (ref 39.0–52.0)
Hemoglobin: 8.3 g/dL — ABNORMAL LOW (ref 13.0–17.0)
Immature Granulocytes: 1 %
Lymphocytes Relative: 3 %
Lymphs Abs: 0.3 10*3/uL — ABNORMAL LOW (ref 0.7–4.0)
MCH: 25.9 pg — ABNORMAL LOW (ref 26.0–34.0)
MCHC: 31.6 g/dL (ref 30.0–36.0)
MCV: 81.9 fL (ref 80.0–100.0)
Monocytes Absolute: 0.7 10*3/uL (ref 0.1–1.0)
Monocytes Relative: 8 %
Neutro Abs: 8.4 10*3/uL — ABNORMAL HIGH (ref 1.7–7.7)
Neutrophils Relative %: 88 %
Platelets: 160 10*3/uL (ref 150–400)
RBC: 3.21 MIL/uL — ABNORMAL LOW (ref 4.22–5.81)
RDW: 18.6 % — ABNORMAL HIGH (ref 11.5–15.5)
WBC: 9.6 10*3/uL (ref 4.0–10.5)
nRBC: 0 % (ref 0.0–0.2)

## 2020-12-04 LAB — VITAMIN B12: Vitamin B-12: 443 pg/mL (ref 180–914)

## 2020-12-04 LAB — URINALYSIS, ROUTINE W REFLEX MICROSCOPIC
Bacteria, UA: NONE SEEN
Bilirubin Urine: NEGATIVE
Glucose, UA: NEGATIVE mg/dL
Ketones, ur: 20 mg/dL — AB
Leukocytes,Ua: NEGATIVE
Nitrite: NEGATIVE
Protein, ur: 100 mg/dL — AB
Specific Gravity, Urine: 1.019 (ref 1.005–1.030)
pH: 5 (ref 5.0–8.0)

## 2020-12-04 LAB — BASIC METABOLIC PANEL
Anion gap: 13 (ref 5–15)
BUN: 5 mg/dL — ABNORMAL LOW (ref 6–20)
CO2: 24 mmol/L (ref 22–32)
Calcium: 8.6 mg/dL — ABNORMAL LOW (ref 8.9–10.3)
Chloride: 96 mmol/L — ABNORMAL LOW (ref 98–111)
Creatinine, Ser: 0.7 mg/dL (ref 0.61–1.24)
GFR, Estimated: >60 mL/min (ref >60)
Glucose, Bld: 80 mg/dL (ref 70–99)
Potassium: 3 mmol/L — ABNORMAL LOW (ref 3.5–5.1)
Sodium: 133 mmol/L — ABNORMAL LOW (ref 135–145)

## 2020-12-04 LAB — COMPREHENSIVE METABOLIC PANEL
ALT: 36 U/L (ref 0–44)
AST: 116 U/L — ABNORMAL HIGH (ref 15–41)
Albumin: 3.5 g/dL (ref 3.5–5.0)
Alkaline Phosphatase: 102 U/L (ref 38–126)
Anion gap: 22 — ABNORMAL HIGH (ref 5–15)
BUN: 6 mg/dL (ref 6–20)
CO2: 19 mmol/L — ABNORMAL LOW (ref 22–32)
Calcium: 9.1 mg/dL (ref 8.9–10.3)
Chloride: 92 mmol/L — ABNORMAL LOW (ref 98–111)
Creatinine, Ser: 0.92 mg/dL (ref 0.61–1.24)
GFR, Estimated: >60 mL/min (ref >60)
Glucose, Bld: 103 mg/dL — ABNORMAL HIGH (ref 70–99)
Potassium: 2.9 mmol/L — ABNORMAL LOW (ref 3.5–5.1)
Sodium: 133 mmol/L — ABNORMAL LOW (ref 135–145)
Total Bilirubin: 1.6 mg/dL — ABNORMAL HIGH (ref 0.3–1.2)
Total Protein: 8.4 g/dL — ABNORMAL HIGH (ref 6.5–8.1)

## 2020-12-04 LAB — RESP PANEL BY RT-PCR (FLU A&B, COVID) ARPGX2
Influenza A by PCR: NEGATIVE
Influenza B by PCR: NEGATIVE
SARS Coronavirus 2 by RT PCR: NEGATIVE

## 2020-12-04 LAB — GLUCOSE, CSF: Glucose, CSF: 67 mg/dL (ref 40–70)

## 2020-12-04 LAB — LACTIC ACID, PLASMA
Lactic Acid, Venous: >9 mmol/L (ref 0.5–1.9)
Lactic Acid, Venous: >9 mmol/L (ref 0.5–1.9)
Lactic Acid, Venous: 0.9 mmol/L (ref 0.5–1.9)

## 2020-12-04 LAB — PROTEIN, CSF: Total  Protein, CSF: 51 mg/dL — ABNORMAL HIGH (ref 15–45)

## 2020-12-04 LAB — IRON AND TIBC
Iron: 18 ug/dL — ABNORMAL LOW (ref 45–182)
Saturation Ratios: 5 % — ABNORMAL LOW (ref 17.9–39.5)
TIBC: 372 ug/dL (ref 250–450)
UIBC: 354 ug/dL

## 2020-12-04 LAB — MAGNESIUM
Magnesium: 1.3 mg/dL — ABNORMAL LOW (ref 1.7–2.4)
Magnesium: 1.6 mg/dL — ABNORMAL LOW (ref 1.7–2.4)

## 2020-12-04 LAB — CSF CELL COUNT WITH DIFFERENTIAL
RBC Count, CSF: 241 /mm3 — ABNORMAL HIGH
Tube #: 1
WBC, CSF: 4 /mm3 (ref 0–5)

## 2020-12-04 LAB — ETHANOL: Alcohol, Ethyl (B): <10 mg/dL (ref <10)

## 2020-12-04 LAB — CBG MONITORING, ED: Glucose-Capillary: 114 mg/dL — ABNORMAL HIGH (ref 70–99)

## 2020-12-04 LAB — FOLATE: Folate: 11.7 ng/mL (ref >5.9)

## 2020-12-04 MED ORDER — ORAL CARE MOUTH RINSE
15.0000 mL | OROMUCOSAL | Status: DC
  Administered 2020-12-05: 15 mL via OROMUCOSAL

## 2020-12-04 MED ORDER — ENOXAPARIN SODIUM 40 MG/0.4ML IJ SOSY
40.0000 mg | PREFILLED_SYRINGE | INTRAMUSCULAR | Status: DC
  Administered 2020-12-04 – 2020-12-05 (×2): 40 mg via SUBCUTANEOUS
  Filled 2020-12-04 (×2): qty 0.4

## 2020-12-04 MED ORDER — MAGNESIUM SULFATE 2 GM/50ML IV SOLN
2.0000 g | Freq: Once | INTRAVENOUS | Status: AC
  Administered 2020-12-04: 2 g via INTRAVENOUS
  Filled 2020-12-04: qty 50

## 2020-12-04 MED ORDER — POTASSIUM CHLORIDE 10 MEQ/100ML IV SOLN
10.0000 meq | INTRAVENOUS | Status: AC
Start: 2020-12-04 — End: 2020-12-05
  Administered 2020-12-04 – 2020-12-05 (×4): 10 meq via INTRAVENOUS
  Filled 2020-12-04 (×4): qty 100

## 2020-12-04 MED ORDER — LACTATED RINGERS IV BOLUS
1000.0000 mL | Freq: Once | INTRAVENOUS | Status: AC
  Administered 2020-12-04: 1000 mL via INTRAVENOUS

## 2020-12-04 MED ORDER — ACETAMINOPHEN 325 MG PO TABS
650.0000 mg | ORAL_TABLET | Freq: Once | ORAL | Status: AC
  Administered 2020-12-04: 650 mg via ORAL
  Filled 2020-12-04: qty 2

## 2020-12-04 MED ORDER — VANCOMYCIN HCL 10 G IV SOLR
20.0000 mg/kg | Freq: Once | INTRAVENOUS | Status: DC
  Filled 2020-12-04: qty 15

## 2020-12-04 MED ORDER — VANCOMYCIN HCL 1500 MG/300ML IV SOLN
1500.0000 mg | Freq: Once | INTRAVENOUS | Status: AC
  Administered 2020-12-04: 1500 mg via INTRAVENOUS
  Filled 2020-12-04: qty 300

## 2020-12-04 MED ORDER — VANCOMYCIN HCL IN DEXTROSE 1-5 GM/200ML-% IV SOLN
1000.0000 mg | Freq: Two times a day (BID) | INTRAVENOUS | Status: DC
  Administered 2020-12-05: 1000 mg via INTRAVENOUS
  Filled 2020-12-04 (×2): qty 200

## 2020-12-04 MED ORDER — LORAZEPAM 2 MG/ML IJ SOLN
INTRAMUSCULAR | Status: AC
  Administered 2020-12-04: 2 mg
  Filled 2020-12-04: qty 1

## 2020-12-04 MED ORDER — LORAZEPAM 2 MG/ML IJ SOLN: 1.0000 mg | INTRAMUSCULAR | Status: DC | PRN

## 2020-12-04 MED ORDER — CHLORHEXIDINE GLUCONATE 0.12% ORAL RINSE (MEDLINE KIT)
15.0000 mL | Freq: Two times a day (BID) | OROMUCOSAL | Status: DC
  Administered 2020-12-05: 15 mL via OROMUCOSAL

## 2020-12-04 MED ORDER — ACETAMINOPHEN 325 MG PO TABS: 650.0000 mg | ORAL_TABLET | Freq: Four times a day (QID) | ORAL | Status: DC | PRN

## 2020-12-04 MED ORDER — LORAZEPAM 2 MG/ML IJ SOLN: 1.0000 mg | Freq: Once | INTRAMUSCULAR | Status: AC

## 2020-12-04 MED ORDER — FOLIC ACID 5 MG/ML IJ SOLN
1.0000 mg | Freq: Every day | INTRAMUSCULAR | Status: DC
  Administered 2020-12-04: 1 mg via INTRAVENOUS
  Filled 2020-12-04 (×4): qty 0.2

## 2020-12-04 MED ORDER — SODIUM CHLORIDE 0.9 % IV SOLN
2.0000 g | Freq: Three times a day (TID) | INTRAVENOUS | Status: DC
  Administered 2020-12-04 – 2020-12-05 (×2): 2 g via INTRAVENOUS
  Filled 2020-12-04 (×2): qty 2

## 2020-12-04 MED ORDER — ONDANSETRON HCL 4 MG/2ML IJ SOLN: 4.0000 mg | Freq: Four times a day (QID) | INTRAMUSCULAR | Status: DC | PRN

## 2020-12-04 MED ORDER — ACETAMINOPHEN 650 MG RE SUPP: 650.0000 mg | Freq: Four times a day (QID) | RECTAL | Status: DC | PRN

## 2020-12-04 MED ORDER — LORAZEPAM 2 MG/ML IJ SOLN: 0.5000 mg | INTRAMUSCULAR | Status: DC | PRN

## 2020-12-04 MED ORDER — METRONIDAZOLE 500 MG/100ML IV SOLN
500.0000 mg | Freq: Once | INTRAVENOUS | Status: AC
  Administered 2020-12-04: 500 mg via INTRAVENOUS
  Filled 2020-12-04: qty 100

## 2020-12-04 MED ORDER — LORAZEPAM 2 MG/ML IJ SOLN
1.0000 mg | INTRAMUSCULAR | Status: DC | PRN
  Administered 2020-12-05 (×2): 1 mg via INTRAVENOUS
  Filled 2020-12-04 (×2): qty 1

## 2020-12-04 MED ORDER — POTASSIUM CHLORIDE 10 MEQ/100ML IV SOLN
10.0000 meq | INTRAVENOUS | Status: AC
Start: 2020-12-04 — End: 2020-12-04
  Administered 2020-12-04 (×3): 10 meq via INTRAVENOUS
  Filled 2020-12-04 (×3): qty 100

## 2020-12-04 MED ORDER — ACETAMINOPHEN 500 MG PO TABS
1000.0000 mg | ORAL_TABLET | Freq: Once | ORAL | Status: AC
  Administered 2020-12-04: 1000 mg via ORAL
  Filled 2020-12-04: qty 2

## 2020-12-04 MED ORDER — LORAZEPAM 2 MG/ML IJ SOLN: 2.0000 mg | Freq: Once | INTRAMUSCULAR | Status: AC

## 2020-12-04 MED ORDER — SODIUM CHLORIDE 0.9% FLUSH
3.0000 mL | Freq: Two times a day (BID) | INTRAVENOUS | Status: DC
  Administered 2020-12-05: 3 mL via INTRAVENOUS

## 2020-12-04 MED ORDER — LACTATED RINGERS IV BOLUS (SEPSIS)
1000.0000 mL | Freq: Once | INTRAVENOUS | Status: AC
  Administered 2020-12-04: 1000 mL via INTRAVENOUS

## 2020-12-04 MED ORDER — LACTATED RINGERS IV SOLN: INTRAVENOUS | Status: AC

## 2020-12-04 MED ORDER — ONDANSETRON HCL 4 MG PO TABS: 4.0000 mg | ORAL_TABLET | Freq: Four times a day (QID) | ORAL | Status: DC | PRN

## 2020-12-04 MED ORDER — THIAMINE HCL 100 MG/ML IJ SOLN
100.0000 mg | Freq: Every day | INTRAMUSCULAR | Status: DC
  Administered 2020-12-04 – 2020-12-05 (×2): 100 mg via INTRAVENOUS
  Filled 2020-12-04 (×2): qty 2

## 2020-12-04 MED ORDER — SODIUM CHLORIDE 0.9 % IV SOLN
2.0000 g | Freq: Once | INTRAVENOUS | Status: AC
  Administered 2020-12-04: 2 g via INTRAVENOUS
  Filled 2020-12-04: qty 2

## 2020-12-04 MED ORDER — LORAZEPAM 2 MG/ML IJ SOLN
INTRAMUSCULAR | Status: AC
  Administered 2020-12-04: 1 mg via INTRAVENOUS
  Filled 2020-12-04: qty 1

## 2020-12-04 MED ORDER — LIDOCAINE HCL (PF) 1 % IJ SOLN
5.0000 mL | Freq: Once | INTRAMUSCULAR | Status: AC
  Administered 2020-12-04: 3 mL via INTRADERMAL

## 2020-12-04 MED ORDER — LEVETIRACETAM IN NACL 1500 MG/100ML IV SOLN
1500.0000 mg | Freq: Once | INTRAVENOUS | Status: AC
  Administered 2020-12-04: 1500 mg via INTRAVENOUS
  Filled 2020-12-04: qty 100

## 2020-12-04 NOTE — ED Notes (Addendum)
Sitter for other patient notified this RN that patient was full body seizing with episode lasting around 10-15 seconds.

## 2020-12-04 NOTE — Sepsis Progress Note (Signed)
Notified bedside nurse of need to draw repeat lactic acid. Thank you. 

## 2020-12-04 NOTE — H&P (Addendum)
Date: 12/04/2020               Patient Name:  Samuel Blevins MRN: [id number]  DOB: 11/19/1965 Age / Sex: 55 y.o., male   PCP: [name], MD         Medical Service: Internal Medicine Teaching Service         Attending Physician: Dr. Mayford Knife, Dorene Ar, MD    First Contact: Dr. Welton Flakes Pager: [phone]  Second Contact: Dr. Huel Cote Pager: [phone]       After Hours (After 5p/  First Contact Pager: [phone]  weekends / holidays): Second Contact Pager: [phone]   Chief Complaint: Altered Mental Status  History of Present Illness:  Patient is 55 year old male with PMH of alcohol abuse, htn, homelessness, anemia, recurrent pancreatitis, gastritis, AVM of small bowels was brought in by EMS after being found laying around. Seizure like activity was noted by bystanders and then again in the ED. Patient seen in the ED. He appears lethargic but awoke to repeated name calling. He would quickly go back to sleep and did not contribute to history. Upon repeated questioning, he did state he was in the hospital and then in stated he was in [place]. He denied any pain and repeatedly said he needs to go to his room. He did present to the ED on 12/03/20 for abdominal pain that is relieved with drinking but left from triage w/o evaluation.     ED course:  Patient given 2 mg of Ativan and loading dose of  keppra. Repeat 1 mg Ativan injection given. IV abx started: Vanc 1 g q12hrs, Cefepime 2 g q8hrs, Zofran for nausea. CT was done showing no acute findings.  Meds: Patient chart review showed the follow medicines listed. Note stated pt not taking these.  Citalopram 20 mg Folic Acid 1 mg Prilosec 40 mg Thiamine 100 mg     Allergies:No known allergies.  Allergies as of 12/04/2020   (No Known Allergies)   Past Medical History:  Diagnosis Date   Alcohol withdrawal syndrome with complication (HCC)    Alcoholism (HCC)    Elevated AST (SGOT)    Homeless    Hypertension    Symptomatic  anemia 11/23/2015    Family History: Was not able to obtain.  Family History  Problem Relation Age of Onset   Diabetes Mellitus II Neg Hx    Colon cancer Neg Hx    Stomach cancer Neg Hx    Pancreatic cancer Neg Hx    Social History: Was not able to obtain.  Social History   Socioeconomic History   Marital status: Single    Spouse name: Not on file   Number of children: Not on file   Years of education: Not on file   Highest education level: Not on file  Occupational History   Not on file  Tobacco Use   Smoking status: Every Day    Packs/day: 1.00    Years: 30.00    Pack years: 30.00    Types: Cigarettes   Smokeless tobacco: Never  Vaping Use   Vaping Use: Never used  Substance and Sexual Activity   Alcohol use: Yes   Drug use: Never   Sexual activity: Not Currently  Other Topics Concern   Not on file  Social History Narrative   Not on file   Social Determinants of Health   Financial Resource Strain: Not on file  Food Insecurity: Not on file  Transportation Needs: Not  on file  Physical Activity: Not on file  Stress: Not on file  Social Connections: Not on file  Intimate Partner Violence: Not on file    Physical Exam: Blood pressure 137/87, pulse 94, temperature 99.3 F (37.4 C), temperature source Oral, resp. rate 17, height 5\' 9"  (1.753 m), weight 74.8 kg, SpO2 100 %.  General: Lethargic male laying in bed Head: Nontraumatic, normocephalic Eyes: PERL Mouth:Unable to fully assess but lacerations noted on right lateral tongue.  Lungs: Rhonchi present diffusely. Limited by anterior auscultation and poor effort Cardiovascular: Normal heart sounds, no r/m/g heard. Abdomen: Appears distended but soft on palpation. Normal bowel sounds MSK: No LE edema Skin: No lesions noted. Feet checked bilaterally.  Neuro: Unable to fully assess. Patient lethargic but oriented to person, place.  Psych: Unable to assess  CBC Latest Ref Rng & Units 12/04/2020 12/03/2020  10/03/2020  WBC 4.0 - 10.5 K/uL 9.6 5.9 2.5(L)  Hemoglobin 13.0 - 17.0 g/dL 8.3(L) 8.6(L) 9.0(L)  Hematocrit 39.0 - 52.0 % 26.3(L) 27.3(L) 29.7(L)  Platelets 150 - 400 K/uL 160 168 140(L)   CMP Latest Ref Rng & Units 12/04/2020 12/03/2020 10/03/2020  Glucose 70 - 99 mg/dL 12/03/2020) 95 160(V)  BUN 6 - 20 mg/dL 6 5(L) 13  Creatinine 371(G - 1.24 mg/dL 6.26 9.48 5.46  Sodium 135 - 145 mmol/L 133(L) 132(L) 141  Potassium 3.5 - 5.1 mmol/L 2.9(L) 2.8(L) 3.1(L)  Chloride 98 - 111 mmol/L 92(L) 92(L) 103  CO2 22 - 32 mmol/L 19(L) 26 23  Calcium 8.9 - 10.3 mg/dL 9.1 2.70) 9.4  Total Protein 6.5 - 8.1 g/dL 3.5(K) 8.3(H) 8.9(H)  Total Bilirubin 0.3 - 1.2 mg/dL 0.9(F) 8.1(W) 0.9  Alkaline Phos 38 - 126 U/L 102 99 112  AST 15 - 41 U/L 116(H) 80(H) 183(H)  ALT 0 - 44 U/L 36 33 61(H)   CT Head Wo Contrast  Result Date: 12/04/2020 CLINICAL DATA:  Seizure, nontraumatic (Age >= 41y) Mental status change, MPRESSION: No evidence of acute intracranial abnormality. Electronically Signed   By: 02/03/2021 M.D.   On: 12/04/2020 13:12   DG Chest Port 1 View  Result Date: 12/04/2020 CLINICAL DATA:  Provided history: Sepsis, possible seizure. EXAM: PORTABLE CHEST 1 VIEW COMPARISON:  IMPRESSION: Small portions of the lateral costophrenic angles are excluded from the field of view bilaterally. No evidence of acute cardiopulmonary abnormality. Aortic Atherosclerosis (ICD10-I70.0). Electronically Signed   By: 02/03/2021 D.O.   On: 12/04/2020 08:21   DG FL GUIDED LUMBAR PUNCTURE  Result Date: 12/04/2020 CLINICAL DATA:  Patient with history of altered mental status. Request is for lumbar puncture. EXAM: DIAGNOSTIC LUMBAR PUNCTURE UNDER FLUOROSCOPIC GUIDANCE COMPARISON: IMPRESSION: Technically successful lumbar puncture. Electronically Signed   By: 02/03/2021 M.D.   On: 12/04/2020 16:40     EKG: personally reviewed my interpretation is NSR.  CXR: personally reviewed my interpretation is normal  CXR.  Assessment & Plan by Problem: Patient is 55 year old male with PMH of alcohol abuse, htn, homelessness, anemia, recurrent pancreatitis, gastritis, AVM of small bowels was brought in by EMS after being found laying around.  Lethargy/fever/Altered Mental Status Lactic Acidosis Patient brought to the ED by EMS altered and lethargic while being febrile. Ddx include Meningitis vs Acute pancreatitis vs Alcohol withdrawal vs Seizure disorder. His CBC does show a neutrophil predominance making bacterial meningitis likely. He endorsed neck pain and that along with AMS and fever makes this diagnosis likely. Lumbar puncture ordered due to seriousness of this  issue. Acute pancreatitis is possible as patient came in last night with abdominal pain and had elevated lipase at 180. Patient has hx of elevated lipase but this is higher than previous ones. Alcohol withdrawal is likely as well due to patient's hx of alcohol use and his endorsement of being unable to purchase more alcohol recently. The fever and seizures would be found in alcohol withdrawal. Primary seizure disorder is possible but least likely given the acuteness of this situation. Would be able to differentiate as patient becomes more alert and can provide history.  -Continue IV abx; Vanc 1 g q12hrs, -Cefepime 2 g q8hrs, Zofran for nausea.  -Repeat lactic acid -f/u lumbar cx results -Bcx pending -Continue to monitor  Alcohol Use Disorder -CIWA protocol  Hypomagnesemia  Patient lab work showed mg of 1.3. Repleted in the ED with 2 g IV magnesium. Will repeat and replete with goal of 2.  Anemia Patient has anemia with hgb of 8.3. His hgb appeared to stay on the lower end when compared with previous records. Most likely a mixed anemia as it is normocytic but patient has history of alcohol use.  -Check Iron panel, VB12 and Folate -Supplement if needed  Transaminitis Patient has transaminitis that is most likely secondary to alcohol use  disorder. Fatty infiltration seen on CT Abdomen with contrast in 09/16/20. -Continue to monitor  DVT prophx: Lovenox Diet: NPO Bowel:PRN Code:Full  Prior to Admission Living Arrangement: Homelessness Anticipated Discharge Location: TBD Barriers to Discharge:Medical workup  Dispo: Admit patient to Observation with expected length of stay less than 2 midnights.  Gwenevere Abbot, MD Eligha Bridegroom. [facility] Internal Medicine Residency, PGY-1  Pager: [phone]

## 2020-12-04 NOTE — Progress Notes (Signed)
Pharmacy Antibiotic Note  Samuel Blevins is a 55 y.o. male admitted on 12/04/2020 with sepsis.  Pharmacy has been consulted for vancomycin and cefepime dosing.  Plan: Vancomycin 1500mg  x1 then 1000mg  IV q12h (eAUC 471, Cr 0.92, Vd 0.72L/kg) Cefepime 2g IV q8h -Monitor renal function, clinical status, and antibiotic plan -Order vanc levels as necessary  Height: 5\' 9"  (175.3 cm) Weight: 74.8 kg (165 lb) IBW/kg (Calculated) : 70.7  Temp (24hrs), Avg:100.2 F (37.9 C), Min:99.2 F (37.3 C), Max:101 F (38.3 C)  Recent Labs  Lab 12/03/20 0227 12/04/20 0811 12/04/20 1044  WBC 5.9 9.6  --   CREATININE 0.82 0.92  --   LATICACIDVEN  --  >9* >9*    Estimated Creatinine Clearance: 89.7 mL/min (by C-G formula based on SCr of 0.92 mg/dL).    No Known Allergies  Antimicrobials this admission: Vanc 10/10 >>  Cefepime 10/10 >>  Flagyl x1 in ED  Dose adjustments this admission: N/A  Microbiology results: 10/10 BCx:   Thank you for allowing pharmacy to be a part of this patient's care.  02/02/21, PharmD, [facility] Emergency Medicine Clinical Pharmacist ED RPh Phone: [phone] Main RX: [phone]

## 2020-12-04 NOTE — ED Notes (Signed)
Called lab to have Magnesium added to previous collection. Per lab Magnesium will be added.

## 2020-12-04 NOTE — ED Notes (Signed)
Pt remains postictal at this time. Seizure precautions maintain in place

## 2020-12-04 NOTE — ED Provider Notes (Signed)
[facility] EMERGENCY DEPARTMENT Provider Note   CSN: [id number] Arrival date & time: 12/04/20  5784     History Chief Complaint  Patient presents with   Cold Exposure   Code Sepsis    Samuel Blevins is a 55 y.o. male.  Patient is a 55 year old male with a history of ongoing alcohol abuse, hypertension, homelessness, anemia, recurrent pancreatitis, gastritis, AVM of the small bowel who is not currently taking any medications presenting today with EMS after he was found laying outside of [employer] and bystanders witnessed him shaking.  When EMS arrived there was no evidence of seizure activity.  Patient was arousable and reported he had slept outside all night.  He was complaining of feeling cold.  Patient was seen approximately 24 hours ago in the emergency room and at that time he was complaining of abdominal pain and nausea.  Currently patient denies having any abdominal pain.  He reports he did not want to come here and he does not know why they brought him here.  He denies shortness of breath, cough, extremity pain or cramping.  He denies any recent diarrhea or black stools.  He denies vomiting.  Patient was noted to have a temperature of 100.3 which he is unaware of.  The history is provided by the patient, the EMS personnel and medical records.      Past Medical History:  Diagnosis Date   Alcohol withdrawal syndrome with complication (HCC)    Alcoholism (HCC)    Elevated AST (SGOT)    Homeless    Hypertension    Symptomatic anemia 11/23/2015    Patient Active Problem List   Diagnosis Date Noted   Gastrointestinal hemorrhage    Acute pancreatitis 09/16/2020   Alcohol withdrawal (HCC) 09/16/2020   Chronic pain syndrome 12/28/2019   Pancreatic pseudocyst 10/28/2019   Pancreatitis, acute 10/28/2019   Gastritis and gastroduodenitis    AVM (arteriovenous malformation) of small bowel, acquired    ETOH abuse 06/21/2019   Tobacco abuse 03/17/2014    Essential hypertension 03/17/2014    Past Surgical History:  Procedure Laterality Date   BIOPSY  06/23/2019   Procedure: BIOPSY;  Surgeon: Shellia Cleverly, DO;  Location: [facility] ENDOSCOPY;  Service: Gastroenterology;;   ESOPHAGOGASTRODUODENOSCOPY (EGD) WITH PROPOFOL N/A 06/23/2019   Procedure: ESOPHAGOGASTRODUODENOSCOPY (EGD) WITH PROPOFOL;  Surgeon: Shellia Cleverly, DO;  Location: [facility] ENDOSCOPY;  Service: Gastroenterology;  Laterality: N/A;   HOT HEMOSTASIS N/A 06/23/2019   Procedure: HOT HEMOSTASIS (ARGON PLASMA COAGULATION/BICAP);  Surgeon: Shellia Cleverly, DO;  Location: [facility] ENDOSCOPY;  Service: Gastroenterology;  Laterality: N/A;       Family History  Problem Relation Age of Onset   Diabetes Mellitus II Neg Hx    Colon cancer Neg Hx    Stomach cancer Neg Hx    Pancreatic cancer Neg Hx     Social History   Tobacco Use   Smoking status: Every Day    Packs/day: 1.00    Years: 30.00    Pack years: 30.00    Types: Cigarettes   Smokeless tobacco: Never  Vaping Use   Vaping Use: Never used  Substance Use Topics   Alcohol use: Yes   Drug use: Never    Home Medications Prior to Admission medications   Medication Sig Start Date End Date Taking? Authorizing Provider  citalopram (CELEXA) 20 MG tablet TAKE 1 TABLET (20 MG TOTAL) BY MOUTH DAILY. Patient not taking: Reported on 09/16/2020 02/02/20 02/01/21  [name], MD  folic acid (FOLVITE) 1 MG tablet Take 1 tablet (1 mg total) by mouth daily. Patient not taking: Reported on 09/16/2020 05/03/20   [name], MD  omeprazole (PRILOSEC) 40 MG capsule Take 1 capsule (40 mg total) by mouth 2 (two) times daily before a meal. Patient not taking: Reported on 09/16/2020 12/15/19   [name], MD  ondansetron (ZOFRAN ODT) 8 MG disintegrating tablet Take 1 tablet (8 mg total) by mouth every 8 (eight) hours as needed for nausea or vomiting. Patient not taking: Reported on 09/16/2020 05/03/20   [name], MD   thiamine 100 MG tablet Take 1 tablet (100 mg total) by mouth daily. Patient not taking: Reported on 09/16/2020 05/03/20   [name], MD    Allergies    Patient has no known allergies.  Review of Systems   Review of Systems  All other systems reviewed and are negative.  Physical Exam Updated Vital Signs BP 134/83   Pulse (!) 110   Temp 99.3 F (37.4 C) (Oral)   Resp 20   Ht 5\' 9"  (1.753 m)   Wt 74.8 kg   SpO2 100%   BMI 24.37 kg/m   Physical Exam Vitals and nursing note reviewed.  Constitutional:      General: He is not in acute distress.    Appearance: He is well-developed and normal weight.  HENT:     Head: Normocephalic and atraumatic.     Right Ear: Tympanic membrane normal.     Left Ear: Tympanic membrane normal.     Mouth/Throat:     Mouth: Mucous membranes are moist.     Pharynx: No oropharyngeal exudate or posterior oropharyngeal erythema.  Eyes:     Conjunctiva/sclera: Conjunctivae normal.     Pupils: Pupils are equal, round, and reactive to light.  Cardiovascular:     Rate and Rhythm: Regular rhythm. Tachycardia present.     Pulses: Normal pulses.     Heart sounds: No murmur heard. Pulmonary:     Effort: Pulmonary effort is normal. No respiratory distress.     Breath sounds: Normal breath sounds. No wheezing or rales.  Abdominal:     General: Bowel sounds are normal. There is no distension.     Palpations: Abdomen is soft.     Tenderness: There is no abdominal tenderness. There is no right CVA tenderness, left CVA tenderness, guarding or rebound.  Musculoskeletal:        General: No tenderness. Normal range of motion.     Cervical back: Normal range of motion and neck supple.  Skin:    General: Skin is warm and dry.     Findings: No erythema or rash.     Comments: Hot to the touch  Neurological:     Mental Status: He is alert and oriented to person, place, and time. Mental status is at baseline.  Psychiatric:     Comments: Calm and  cooperative    ED Results / Procedures / Treatments   Labs (all labs ordered are listed, but only abnormal results are displayed) Labs Reviewed  LACTIC ACID, PLASMA - Abnormal; Notable for the following components:      Result Value   Lactic Acid, Venous >9 (*)    All other components within normal limits  COMPREHENSIVE METABOLIC PANEL - Abnormal; Notable for the following components:   Sodium 133 (*)    Potassium 2.9 (*)    Chloride 92 (*)    CO2 19 (*)  Glucose, Bld 103 (*)    Total Protein 8.4 (*)    AST 116 (*)    Total Bilirubin 1.6 (*)    Anion gap 22 (*)    All other components within normal limits  CBC WITH DIFFERENTIAL/PLATELET - Abnormal; Notable for the following components:   RBC 3.21 (*)    Hemoglobin 8.3 (*)    HCT 26.3 (*)    MCH 25.9 (*)    RDW 18.6 (*)    Neutro Abs 8.4 (*)    Lymphs Abs 0.3 (*)    Abs Immature Granulocytes 0.12 (*)    All other components within normal limits  URINALYSIS, ROUTINE W REFLEX MICROSCOPIC - Abnormal; Notable for the following components:   Color, Urine AMBER (*)    APPearance HAZY (*)    Hgb urine dipstick SMALL (*)    Ketones, ur 20 (*)    Protein, ur 100 (*)    All other components within normal limits  MAGNESIUM - Abnormal; Notable for the following components:   Magnesium 1.3 (*)    All other components within normal limits  LACTIC ACID, PLASMA - Abnormal; Notable for the following components:   Lactic Acid, Venous >9 (*)    All other components within normal limits  CBG MONITORING, ED - Abnormal; Notable for the following components:   Glucose-Capillary 114 (*)    All other components within normal limits  RESP PANEL BY RT-PCR (FLU A&B, COVID) [id number]  CULTURE, BLOOD (ROUTINE X 2)  CULTURE, BLOOD (ROUTINE X 2)  ETHANOL    EKG EKG Interpretation  Date/Time:  Monday December 04 2020 07:51:14 EDT Ventricular Rate:  122 PR Interval:  152 QRS Duration: 99 QT Interval:  323 QTC Calculation: 461 R  Axis:   94 Text Interpretation: Sinus tachycardia Multiform ventricular premature complexes Probable left atrial enlargement Borderline right axis deviation Probable anteroseptal infarct, old No significant change since last tracing Confirmed by Gwyneth Sprout ([id number]) on 12/04/2020 8:22:38 AM  Radiology CT Head Wo Contrast  Result Date: 12/04/2020 CLINICAL DATA:  Seizure, nontraumatic (Age >= 41y) Mental status change, unknown cause EXAM: CT HEAD WITHOUT CONTRAST TECHNIQUE: Contiguous axial images were obtained from the base of the skull through the vertex without intravenous contrast. COMPARISON:  08/26/2020. FINDINGS: Brain: No evidence of acute infarction, hemorrhage, hydrocephalus, extra-axial collection or mass lesion/mass effect. Vascular: No hyperdense vessel identified. Calcific intracranial atherosclerosis. Skull: No acute fracture. Sinuses/Orbits: Mild paranasal sinus mucosal thickening. Remote left medial orbital wall fracture. Other: No mastoid effusions. IMPRESSION: No evidence of acute intracranial abnormality. Electronically Signed   By: Feliberto Harts M.D.   On: 12/04/2020 13:12   DG Chest Port 1 View  Result Date: 12/04/2020 CLINICAL DATA:  Provided history: Sepsis, possible seizure. EXAM: PORTABLE CHEST 1 VIEW COMPARISON:  Prior chest radiographs 10/03/2020 and earlier. FINDINGS: Please note small portions of the lateral costophrenic angles are excluded from the field of view bilaterally. Heart size within normal limits. Aortic atherosclerosis. No appreciable airspace consolidation. No evidence of pleural effusion or pneumothorax. No acute bony abnormality identified. IMPRESSION: Small portions of the lateral costophrenic angles are excluded from the field of view bilaterally. No evidence of acute cardiopulmonary abnormality. Aortic Atherosclerosis (ICD10-I70.0). Electronically Signed   By: Jackey Loge D.O.   On: 12/04/2020 08:21    Procedures .Lumbar Puncture  Date/Time:  12/04/2020 2:18 PM Performed by: Gwyneth Sprout, MD Authorized by: Gwyneth Sprout, MD   Consent:    Consent obtained:  Emergent situation Universal protocol:  Imaging studies available: yes     Patient identity confirmed:  Arm band Pre-procedure details:    Procedure purpose:  Diagnostic   Preparation: Patient was prepped and draped in usual sterile fashion   Anesthesia:    Anesthesia method:  Local infiltration   Local anesthetic:  Lidocaine 2% WITH epi Procedure details:    Lumbar space:  L4-L5 interspace   Patient position:  R lateral decubitus   Needle gauge:  20   Needle type:  Diamond point   Needle length (in):  2.5   Ultrasound guidance: no     Number of attempts:  3 Post-procedure details:    Puncture site:  Adhesive bandage applied   Procedure completion:  Tolerated Comments:     Unable to obtain spinal fluid despite multiple attempts   Medications Ordered in ED Medications  lactated ringers infusion ( Intravenous New Bag/Given 12/04/20 1158)  vancomycin (VANCOCIN) IVPB 1000 mg/200 mL premix (has no administration in time range)  ceFEPIme (MAXIPIME) 2 g in sodium chloride 0.9 % 100 mL IVPB (has no administration in time range)  acetaminophen (TYLENOL) tablet 1,000 mg (has no administration in time range)  lactated ringers bolus 1,000 mL (0 mLs Intravenous Stopped 12/04/20 1136)  acetaminophen (TYLENOL) tablet 650 mg (650 mg Oral Given 12/04/20 0913)  levETIRAcetam (KEPPRA) IVPB 1500 mg/ 100 mL premix (0 mg Intravenous Stopped 12/04/20 1106)  potassium chloride 10 mEq in 100 mL IVPB (0 mEq Intravenous Stopped 12/04/20 1407)  magnesium sulfate IVPB 2 g 50 mL (0 g Intravenous Stopped 12/04/20 1135)  LORazepam (ATIVAN) injection 2 mg (2 mg Intravenous Given 12/04/20 1000)  lactated ringers bolus 1,000 mL (1,000 mLs Intravenous New Bag/Given 12/04/20 1157)  ceFEPIme (MAXIPIME) 2 g in sodium chloride 0.9 % 100 mL IVPB (0 g Intravenous Stopped 12/04/20 1231)   metroNIDAZOLE (FLAGYL) IVPB 500 mg (0 mg Intravenous Stopped 12/04/20 1405)  vancomycin (VANCOREADY) IVPB 1500 mg/300 mL (1,500 mg Intravenous New Bag/Given 12/04/20 1200)    ED Course  I have reviewed the triage vital signs and the nursing notes.  Pertinent labs & imaging results that were available during my care of the patient were reviewed by me and considered in my medical decision making (see chart for details).    MDM Rules/Calculators/A&P                           Patient is a 55 year old male presenting today with EMS after being found outside [employer].  Patient is known to be homeless and does abuse alcohol.  He was seen 24 hours ago in the emergency room and had an MSE but did not stay for treatment.  At that time he was complaining of abdominal pain and had labs that showed hypokalemia, stable hemoglobin at 8.6 and normal white count.  He also had an elevated lipase of 124 hours ago.  Patient did admit to leaving and drinking.  He reports today he does not know why he came in he did not asked to be brought.  However patient was noted to be tachycardic and febrile to 100.3.  He is denying any respiratory symptoms or abdominal symptoms at this time.  No evidence of hypotension at this time.  Patient was hypokalemic yesterday and assume most likely hypomagnesemic.  We will repeat today to evaluate as well as check a COVID and lactic acid.  Will give Tylenol, IV fluids and follow-up on labs.  Chest x-ray without acute findings.  EKG was sinus tachycardia but no other acute findings  2:20 PM Approximately at 10:00 AM patient had a full tonic-clonic seizure that lasted for 30 seconds or less.  He did have some residual teeth biting that was rhythmic and he received 2 mg of Ativan and a load of Keppra.  Unclear if seizure is related to recent fever and infection or if its due to alcohol withdrawal as patient is a heavy drinker and unclear when he last had any alcohol.  He did not have  any headache or neck pain prior to the seizure on initial evaluation but temperature is 101.  Patient's lactate is greater than 9 and this could be related to sepsis but also could be related to seizures earlier in the day as EMS reported that bystanders did see the patient shaking.  Blood sugar is still normal.  CMP today with a anion gap of 22, persistent elevated LFTs with an AST of 116 and persistent hypokalemia with a potassium of 2.9 and hypomagnesemia with a magnesium of 1.3.  CBC with stable hemoglobin of 8.3 which is his baseline.  Chest x-ray without acute findings and COVID and flu are negative.  Patient is still postictal at this time but given flu, new seizure without prior history we will do a head CT and patient will most likely need an LP.  He was covered broadly with antibiotics.  2:20 PM Head CT neg.  Unable to get bedside LP despite multiple attempts.  Pt now more awake.  Will admit for further care and fluoro LP pending.  MDM   Amount and/or Complexity of Data Reviewed Clinical lab tests: ordered and reviewed Tests in the radiology section of CPT: ordered and reviewed Tests in the medicine section of CPT: ordered and reviewed Decide to obtain previous medical records or to obtain history from someone other than the patient: yes Obtain history from someone other than the patient: yes Review and summarize past medical records: yes Discuss the patient with other providers: yes Independent visualization of images, tracings, or specimens: yes  Risk of Complications, Morbidity, and/or Mortality Presenting problems: high Diagnostic procedures: high Management options: high  Patient Progress Patient progress: stable    CRITICAL CARE Performed by: Aivy Akter Total critical care time: 30 minutes Critical care time was exclusive of separately billable procedures and treating other patients. Critical care was necessary to treat or prevent imminent or life-threatening  deterioration. Critical care was time spent personally by me on the following activities: development of treatment plan with patient and/or surrogate as well as nursing, discussions with consultants, evaluation of patient's response to treatment, examination of patient, obtaining history from patient or surrogate, ordering and performing treatments and interventions, ordering and review of laboratory studies, ordering and review of radiographic studies, pulse oximetry and re-evaluation of patient's condition.  Final Clinical Impression(s) / ED Diagnoses Final diagnoses:  Fever of unknown origin  Seizure [facility])    Rx / DC Orders ED Discharge Orders     None        Gwyneth Sprout, MD 12/04/20 1420

## 2020-12-04 NOTE — Sepsis Progress Note (Signed)
Elink is following this code sepsis ?

## 2020-12-04 NOTE — ED Notes (Signed)
Blood cultures obtained at 0811 and placed at bedside in biohazard lab bag

## 2020-12-04 NOTE — ED Notes (Addendum)
Emergency Consent for LP obtained.

## 2020-12-04 NOTE — ED Triage Notes (Signed)
Pt BIB EMS for a possible seizure reported by bystanders. However EMS confirmed that there was no seizure activity.  Patient was found outside of [employer] early this morning and was reported to have slept the night outside on the asphalt. Upon EMS arrival patient was shaking.  VSS stable with EMS.

## 2020-12-04 NOTE — Sepsis Progress Note (Signed)
Chat sent to Dr. Anitra Lauth to consider 3rd lactic acid.

## 2020-12-04 NOTE — ED Notes (Signed)
[facility] called to ask for sedation to assist pt in remaining still for LP.  PT had moved bowels and peed in purewick.  Pt purewick came off whle he was moving.  He also lost the IV in his hand.  I placed a condom cath and administered 1mg  ativan in the IV in left AC.    Fluoro is reattempting the LP now.

## 2020-12-04 NOTE — ED Notes (Addendum)
Noted that pt's O2 dropped to 84%. Patient noted to be sleeping. Placed pt on 2L O2 via [place] and titrated to 1L, to maintain a O2 above 93%.

## 2020-12-04 NOTE — ED Notes (Signed)
Patient state he drinks a pint of liquor a day. States last drink was last night.

## 2020-12-04 NOTE — ED Notes (Signed)
Admitting at bedside 

## 2020-12-04 NOTE — ED Notes (Signed)
Patient transported to IR for fluro.

## 2020-12-05 ENCOUNTER — Observation Stay (HOSPITAL_COMMUNITY): Payer: Self-pay

## 2020-12-05 LAB — CBC WITH DIFFERENTIAL/PLATELET
Abs Immature Granulocytes: 0.04 10*3/uL (ref 0.00–0.07)
Basophils Absolute: 0 10*3/uL (ref 0.0–0.1)
Basophils Relative: 1 %
Eosinophils Absolute: 0 10*3/uL (ref 0.0–0.5)
Eosinophils Relative: 0 %
HCT: 25.5 % — ABNORMAL LOW (ref 39.0–52.0)
Hemoglobin: 8.1 g/dL — ABNORMAL LOW (ref 13.0–17.0)
Immature Granulocytes: 1 %
Lymphocytes Relative: 14 %
Lymphs Abs: 0.9 10*3/uL (ref 0.7–4.0)
MCH: 25.8 pg — ABNORMAL LOW (ref 26.0–34.0)
MCHC: 31.8 g/dL (ref 30.0–36.0)
MCV: 81.2 fL (ref 80.0–100.0)
Monocytes Absolute: 0.8 10*3/uL (ref 0.1–1.0)
Monocytes Relative: 12 %
Neutro Abs: 4.5 10*3/uL (ref 1.7–7.7)
Neutrophils Relative %: 72 %
Platelets: 192 10*3/uL (ref 150–400)
RBC: 3.14 MIL/uL — ABNORMAL LOW (ref 4.22–5.81)
RDW: 18.8 % — ABNORMAL HIGH (ref 11.5–15.5)
WBC: 6.3 10*3/uL (ref 4.0–10.5)
nRBC: 0.3 % — ABNORMAL HIGH (ref 0.0–0.2)

## 2020-12-05 LAB — COMPREHENSIVE METABOLIC PANEL
ALT: 33 U/L (ref 0–44)
AST: 115 U/L — ABNORMAL HIGH (ref 15–41)
Albumin: 3.1 g/dL — ABNORMAL LOW (ref 3.5–5.0)
Alkaline Phosphatase: 82 U/L (ref 38–126)
Anion gap: 14 (ref 5–15)
BUN: <5 mg/dL — ABNORMAL LOW (ref 6–20)
CO2: 23 mmol/L (ref 22–32)
Calcium: 8.8 mg/dL — ABNORMAL LOW (ref 8.9–10.3)
Chloride: 92 mmol/L — ABNORMAL LOW (ref 98–111)
Creatinine, Ser: 0.68 mg/dL (ref 0.61–1.24)
GFR, Estimated: >60 mL/min (ref >60)
Glucose, Bld: 88 mg/dL (ref 70–99)
Potassium: 3 mmol/L — ABNORMAL LOW (ref 3.5–5.1)
Sodium: 129 mmol/L — ABNORMAL LOW (ref 135–145)
Total Bilirubin: 1.7 mg/dL — ABNORMAL HIGH (ref 0.3–1.2)
Total Protein: 7.5 g/dL (ref 6.5–8.1)

## 2020-12-05 LAB — MAGNESIUM: Magnesium: 1.4 mg/dL — ABNORMAL LOW (ref 1.7–2.4)

## 2020-12-05 MED ORDER — POLYETHYLENE GLYCOL 3350 17 G PO PACK: 17.0000 g | PACK | Freq: Every day | ORAL | Status: DC

## 2020-12-05 MED ORDER — NICOTINE 14 MG/24HR TD PT24: 14.0000 mg | MEDICATED_PATCH | Freq: Every day | TRANSDERMAL | Status: DC

## 2020-12-05 MED ORDER — MAGNESIUM SULFATE 2 GM/50ML IV SOLN
2.0000 g | Freq: Once | INTRAVENOUS | Status: AC
  Administered 2020-12-05: 2 g via INTRAVENOUS
  Filled 2020-12-05: qty 50

## 2020-12-05 MED ORDER — POTASSIUM CHLORIDE CRYS ER 20 MEQ PO TBCR
40.0000 meq | EXTENDED_RELEASE_TABLET | Freq: Two times a day (BID) | ORAL | Status: DC
  Administered 2020-12-05: 40 meq via ORAL
  Filled 2020-12-05: qty 2

## 2020-12-05 MED ORDER — ORAL CARE MOUTH RINSE
15.0000 mL | Freq: Four times a day (QID) | OROMUCOSAL | Status: DC
  Administered 2020-12-05: 15 mL via OROMUCOSAL

## 2020-12-05 NOTE — Progress Notes (Signed)
Patient is 55 year old male with PMH of alcohol abuse, htn, homelessness, anemia, recurrent pancreatitis, gastritis, AVM of small bowels was brought in by EMS after being found laying outside [employer].  Subjective:   Mr. Lomax endorses headaches, night sweats and abdominal pain. Headache and neck pain since he "fell out." Neck pain radiating down left side.    Alert and oriented to person, place, not time. He notes that he is here because "he fell out and someone must have brought me here." He does not recall the events prior to arrival. He does remember coming on Sunday but does not recall the details. He denies prior history of tongue biting, syncope, seizure.    He drinks a pint a day, vodka. He has been drinking for about 20 years.  His last drink was Saturday as far as he recalls. He smokes 1-1.5 ppd x 20 years.   He feels sick like he has been drinking for the last 4-5 days. He is aching all over. The area that hurts the most bilateral lower abdomen. He does not take any medications. His PCP is Dr. [name]. He last saw him two weeks ago.   Last time he considering quitting alcohol is last Christmas.    He stays at the [employer].    He works [employer] and at [employer] that swabs for [employer].     Overnight:NAEON. No CIWA score recorded overnight.   Interim: Patient was not listening to nursing staff and then went to bathroom and got in the shower. He fell and his fall was unwitnessed. Nursing staff found him in the shower and on his knees. They stated he had an unstable gait on ambulation. On my evaluation, he denied any headache. Neuro exam was normal. CNII-XII intact. Sensation and muscle strength intact. Advised him to use the call bell when getting up from the bed.   Objective: Vital signs in last 24 hours: Vitals:   12/05/20 0700 12/05/20 0737 12/05/20 0804 12/05/20 0845  BP: 125/77  133/82 121/66  Pulse:   84 82  Resp: 17  16 19   Temp:  98.8 F (37.1 C)  98.8 F (37.1 C) 99 F (37.2 C)  TempSrc:  Oral Oral Oral  SpO2:   97% 98%  Weight:      Height:       Physical Exam General: laying in bed, no acute distress.  Head: Normocephalic without scalp lesions. Forehead shows diaphoresis.  Eyes: sclerae white, without jaundice.  Mouth: Lips normal color, without lesions. Dry mucus membrane. Laceration on lateral right tongue.  Neck: States pain when instructed to bend forward.  Lungs: Coarse sounding lungs bilaterally but mostly on upper fields. No bibasilar rales present. Rhonchi present diffusely.   Cardiovascular: Normal heart sounds, no r/m/g, 2+ pulses in all extremities Abdomen: Appears protuberant, normal bowel sounds. Some tenderness present but difficult to localize for the patient. No guarding present or rebound tenderness present. Soft on palpation. Percussion reveals diffuse gas. Liver edge palpable.  MSK: No asymmetry or muscle atrophy. Full range of motion (ROM) of all joints..  Skin: warm, decreased good skin turgor, no lesions Neuro: Alert and oriented. CN grossly intact Psych: Normal mood and normal affect  Assessment/Plan: Patient is 55 year old male with PMH of alcohol abuse, htn, homelessness, anemia, recurrent pancreatitis, gastritis, AVM of small bowels was brought in by EMS after being found laying outside [address].     Seizure activity 2/2 to alcohol withdrawal Lactic  acid wnl at 630pm on 12/04/20. Lumbar puncture did not show leukocytosis. Patient endorsing head and neck pain after the initial seizure which maybe traumatic than infectious. Patient eating well and eating multiple trays. Normal WBC during CSF analysis with elevated RBC count likely from traumatic tap. Normal glucose in CSF analysis and slightly increased total protein at 51 with normal being 45. No growth in blood cultures.  -Dc abx -f/u lumbar cx results -Follow Bcx  -Pain control -Continue to monitor   Alcohol Use Disorder -CIWA protocol    Hypomagnesemia/Hypokalemia Patient lab work showed mg of 1.3. Repleted in the ED with 2 g IV magnesium. Repeat was 1.6. Today it was 1.4.  -IV mag 2g ordered. -Potassium 40 mEq BID for 4 doses after magnesium infusion. Will dc potassium once K normalizes.  -Continue to replete and check Mg to get level at 2.0 and K at 4.  Iron Deficiency Anemia Chronic, secondary to poor intake and continuous alcohol use.    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 Liver edge palpable on exam indicating enlarged liver. -Continue to monitor -Recommend workup outpatient as this is chronic in nature.   DVT prophx: Lovenox Diet: General diet Bowel:PRN Code:Full   Prior to Admission Living Arrangement: Homelessness Anticipated Discharge Location: TBD Barriers to Discharge:Medical workup Dispo: Anticipated discharge in approximately 1-2 day(s).   Gwenevere Abbot, MD Eligha Bridegroom. [facility] Internal Medicine Residency, PGY-1  Pager: [phone]

## 2020-12-05 NOTE — TOC Initial Note (Signed)
Transition of Care [facility]) - Initial/Assessment Note    Patient Details  Name: Grason Brailsford MRN: [id number] Date of Birth: 1965/08/17  Transition of Care [facility]) CM/SW Contact:    Kermit Balo, RN Phone Number: 12/05/2020, 4:05 PM  Clinical Narrative:                 Patient states he stays at the [facility]. He feels they will hold his bed and if not he says he can get another one. Pt states he was taking meds prior to being hospitalized but doesn't know the names. He says he sees Dr [name] at [facility]. He can also use their pharmacy for medication assistance.  Pt will need transportation back to [employer] at d/c. He also will need assistance with meds at d/c and will need d/c meds sent to [facility] pharmacy.  TOC following.  Expected Discharge Plan: Home/Self Care Barriers to Discharge: Continued Medical Work up, Inadequate or no insurance   Patient Goals and CMS Choice        Expected Discharge Plan and Services Expected Discharge Plan: Home/Self Care   Discharge Planning Services: CM Consult   Living arrangements for the past 2 months: Homeless Shelter                                      Prior Living Arrangements/Services Living arrangements for the past 2 months: Homeless Shelter Lives with:: Self Patient language and need for interpreter reviewed:: Yes Do you feel safe going back to the place where you live?: Yes            Criminal Activity/Legal Involvement Pertinent to Current Situation/Hospitalization: No - Comment as needed  Activities of Daily Living      Permission Sought/Granted                  Emotional Assessment Appearance:: Appears stated age Attitude/Demeanor/Rapport: Guarded Affect (typically observed): Flat, Guarded Orientation: : Oriented to Self, Oriented to Place, Oriented to  Time, Oriented to Situation   Psych Involvement: No (comment)  Admission diagnosis:  Fever of unknown origin [R50.9] Seizure (HCC)  [R56.9] LFT elevation [R79.89] Sepsis (HCC) [A41.9] AMS (altered mental status) [R41.82] Patient Active Problem List   Diagnosis Date Noted   Seizure (HCC) 12/04/2020   Gastrointestinal hemorrhage    Acute pancreatitis 09/16/2020   Alcohol withdrawal (HCC) 09/16/2020   Chronic pain syndrome 12/28/2019   Pancreatic pseudocyst 10/28/2019   Pancreatitis, acute 10/28/2019   Gastritis and gastroduodenitis    AVM (arteriovenous malformation) of small bowel, acquired    ETOH abuse 06/21/2019   Tobacco abuse 03/17/2014   Essential hypertension 03/17/2014   PCP:  [name], MD Pharmacy:   [facility] [address] [place] - [address] [place] [address] Phone: [phone] Fax: [phone]  [facility] [address] [place] [address] Phone: [phone] Fax: [phone]  [facility] [address] [place] [address] Phone: [phone] Fax: [phone]     Social Determinants of Health (SDOH) Interventions    Readmission Risk Interventions Readmission Risk Prevention Plan 06/22/2019  Post Dischage Appt Complete  Medication Screening Complete  Transportation Screening Complete  Some recent data might be hidden

## 2020-12-05 NOTE — ED Notes (Signed)
PT found up out of bed attempting to use urinal. Pt counseled that pt needs to remain in bed and use call bell in future. Room cleaned, pt repositioned, call bell within reach.

## 2020-12-05 NOTE — TOC CAGE-AID Note (Signed)
Transition of Care [facility]) - CAGE-AID Screening   Patient Details  Name: Samuel Blevins MRN: [id number] Date of Birth: [birth year]  Transition of Care [facility]) CM/SW Contact:    Kermit Balo, RN Phone Number: 12/05/2020, 4:09 PM   Clinical Narrative: Pt states he is interested in outpatient counseling for his alcohol use. CM provided him resources.    CAGE-AID Screening:    Have You Ever Felt You Ought to Cut Down on Your Drinking or Drug Use?: Yes Have People Annoyed You By Critizing Your Drinking Or Drug Use?: No Have You Felt Bad Or Guilty About Your Drinking Or Drug Use?: Yes Have You Ever Had a Drink or Used Drugs First Thing In The Morning to Steady Your Nerves or to Get Rid of a Hangover?: Yes CAGE-AID Score: 3  Substance Abuse Education Offered: Yes (patient provided inpatient/ outpatient drug and alcohol counseling resources.)  Substance abuse interventions: Patient Counseling

## 2020-12-06 ENCOUNTER — Inpatient Hospital Stay (HOSPITAL_COMMUNITY)
Admission: EM | Admit: 2020-12-06 | Discharge: 2020-12-12 | DRG: 438 | Disposition: A | Discharge status: Home / Self Care | Payer: Self-pay | Attending: Internal Medicine | Admitting: Internal Medicine

## 2020-12-06 ENCOUNTER — Encounter (HOSPITAL_COMMUNITY): Payer: Self-pay | Admitting: Emergency Medicine

## 2020-12-06 ENCOUNTER — Other Ambulatory Visit: Payer: Self-pay | Admitting: Internal Medicine

## 2020-12-06 ENCOUNTER — Other Ambulatory Visit: Payer: Self-pay

## 2020-12-06 DIAGNOSIS — K264 Chronic or unspecified duodenal ulcer with hemorrhage: Secondary | ICD-10-CM | POA: Diagnosis present

## 2020-12-06 DIAGNOSIS — R569 Unspecified convulsions: Secondary | ICD-10-CM | POA: Diagnosis present

## 2020-12-06 DIAGNOSIS — K760 Fatty (change of) liver, not elsewhere classified: Secondary | ICD-10-CM | POA: Diagnosis present

## 2020-12-06 DIAGNOSIS — E871 Hypo-osmolality and hyponatremia: Secondary | ICD-10-CM | POA: Diagnosis not present

## 2020-12-06 DIAGNOSIS — K860 Alcohol-induced chronic pancreatitis: Secondary | ICD-10-CM | POA: Diagnosis present

## 2020-12-06 DIAGNOSIS — G894 Chronic pain syndrome: Secondary | ICD-10-CM | POA: Diagnosis present

## 2020-12-06 DIAGNOSIS — F17210 Nicotine dependence, cigarettes, uncomplicated: Secondary | ICD-10-CM | POA: Diagnosis present

## 2020-12-06 DIAGNOSIS — E876 Hypokalemia: Secondary | ICD-10-CM | POA: Diagnosis present

## 2020-12-06 DIAGNOSIS — K709 Alcoholic liver disease, unspecified: Secondary | ICD-10-CM | POA: Diagnosis present

## 2020-12-06 DIAGNOSIS — Z5900 Homelessness unspecified: Secondary | ICD-10-CM

## 2020-12-06 DIAGNOSIS — I10 Essential (primary) hypertension: Secondary | ICD-10-CM | POA: Diagnosis present

## 2020-12-06 DIAGNOSIS — D500 Iron deficiency anemia secondary to blood loss (chronic): Secondary | ICD-10-CM

## 2020-12-06 DIAGNOSIS — K7010 Alcoholic hepatitis without ascites: Secondary | ICD-10-CM

## 2020-12-06 DIAGNOSIS — K859 Acute pancreatitis without necrosis or infection, unspecified: Secondary | ICD-10-CM | POA: Diagnosis present

## 2020-12-06 DIAGNOSIS — D508 Other iron deficiency anemias: Secondary | ICD-10-CM

## 2020-12-06 DIAGNOSIS — F10239 Alcohol dependence with withdrawal, unspecified: Secondary | ICD-10-CM | POA: Diagnosis present

## 2020-12-06 DIAGNOSIS — Z79899 Other long term (current) drug therapy: Secondary | ICD-10-CM

## 2020-12-06 DIAGNOSIS — Z20822 Contact with and (suspected) exposure to covid-19: Secondary | ICD-10-CM | POA: Diagnosis present

## 2020-12-06 DIAGNOSIS — R040 Epistaxis: Secondary | ICD-10-CM | POA: Diagnosis present

## 2020-12-06 DIAGNOSIS — K852 Alcohol induced acute pancreatitis without necrosis or infection: Principal | ICD-10-CM | POA: Diagnosis present

## 2020-12-06 LAB — COMPREHENSIVE METABOLIC PANEL
ALT: 38 U/L (ref 0–44)
AST: 127 U/L — ABNORMAL HIGH (ref 15–41)
Albumin: 3.2 g/dL — ABNORMAL LOW (ref 3.5–5.0)
Alkaline Phosphatase: 97 U/L (ref 38–126)
Anion gap: 11 (ref 5–15)
BUN: 8 mg/dL (ref 6–20)
CO2: 28 mmol/L (ref 22–32)
Calcium: 9.8 mg/dL (ref 8.9–10.3)
Chloride: 95 mmol/L — ABNORMAL LOW (ref 98–111)
Creatinine, Ser: 0.75 mg/dL (ref 0.61–1.24)
GFR, Estimated: >60 mL/min (ref >60)
Glucose, Bld: 103 mg/dL — ABNORMAL HIGH (ref 70–99)
Potassium: 3.1 mmol/L — ABNORMAL LOW (ref 3.5–5.1)
Sodium: 134 mmol/L — ABNORMAL LOW (ref 135–145)
Total Bilirubin: 0.7 mg/dL (ref 0.3–1.2)
Total Protein: 7.9 g/dL (ref 6.5–8.1)

## 2020-12-06 LAB — CBC WITH DIFFERENTIAL/PLATELET
Abs Immature Granulocytes: 0 10*3/uL (ref 0.00–0.07)
Basophils Absolute: 0 10*3/uL (ref 0.0–0.1)
Basophils Relative: 0 %
Eosinophils Absolute: 0 10*3/uL (ref 0.0–0.5)
Eosinophils Relative: 0 %
HCT: 25.8 % — ABNORMAL LOW (ref 39.0–52.0)
Hemoglobin: 7.9 g/dL — ABNORMAL LOW (ref 13.0–17.0)
Lymphocytes Relative: 17 %
Lymphs Abs: 1.1 10*3/uL (ref 0.7–4.0)
MCH: 25.7 pg — ABNORMAL LOW (ref 26.0–34.0)
MCHC: 30.6 g/dL (ref 30.0–36.0)
MCV: 84 fL (ref 80.0–100.0)
Monocytes Absolute: 0.8 10*3/uL (ref 0.1–1.0)
Monocytes Relative: 13 %
Neutro Abs: 4.5 10*3/uL (ref 1.7–7.7)
Neutrophils Relative %: 70 %
Platelets: 247 10*3/uL (ref 150–400)
RBC: 3.07 MIL/uL — ABNORMAL LOW (ref 4.22–5.81)
RDW: 19.5 % — ABNORMAL HIGH (ref 11.5–15.5)
WBC: 6.4 10*3/uL (ref 4.0–10.5)
nRBC: 0 % (ref 0.0–0.2)
nRBC: 0 /100 WBC

## 2020-12-06 LAB — CBC
HCT: 25 % — ABNORMAL LOW (ref 39.0–52.0)
Hemoglobin: 7.7 g/dL — ABNORMAL LOW (ref 13.0–17.0)
MCH: 25.5 pg — ABNORMAL LOW (ref 26.0–34.0)
MCHC: 30.8 g/dL (ref 30.0–36.0)
MCV: 82.8 fL (ref 80.0–100.0)
Platelets: 196 10*3/uL (ref 150–400)
RBC: 3.02 MIL/uL — ABNORMAL LOW (ref 4.22–5.81)
RDW: 19 % — ABNORMAL HIGH (ref 11.5–15.5)
WBC: 5.3 10*3/uL (ref 4.0–10.5)
nRBC: 0 % (ref 0.0–0.2)

## 2020-12-06 LAB — TYPE AND SCREEN
ABO/RH(D): A POS
Antibody Screen: NEGATIVE

## 2020-12-06 LAB — BASIC METABOLIC PANEL
Anion gap: 11 (ref 5–15)
BUN: <5 mg/dL — ABNORMAL LOW (ref 6–20)
CO2: 27 mmol/L (ref 22–32)
Calcium: 9.4 mg/dL (ref 8.9–10.3)
Chloride: 94 mmol/L — ABNORMAL LOW (ref 98–111)
Creatinine, Ser: 0.58 mg/dL — ABNORMAL LOW (ref 0.61–1.24)
GFR, Estimated: >60 mL/min (ref >60)
Glucose, Bld: 105 mg/dL — ABNORMAL HIGH (ref 70–99)
Potassium: 3.2 mmol/L — ABNORMAL LOW (ref 3.5–5.1)
Sodium: 132 mmol/L — ABNORMAL LOW (ref 135–145)

## 2020-12-06 LAB — MAGNESIUM: Magnesium: 1.4 mg/dL — ABNORMAL LOW (ref 1.7–2.4)

## 2020-12-06 LAB — LACTIC ACID, PLASMA: Lactic Acid, Venous: 1.9 mmol/L (ref 0.5–1.9)

## 2020-12-06 LAB — LIPASE, BLOOD: Lipase: 175 U/L — ABNORMAL HIGH (ref 11–51)

## 2020-12-06 MED ORDER — MAGNESIUM SULFATE 2 GM/50ML IV SOLN
2.0000 g | Freq: Once | INTRAVENOUS | Status: AC
  Administered 2020-12-06: 2 g via INTRAVENOUS
  Filled 2020-12-06: qty 50

## 2020-12-06 NOTE — ED Triage Notes (Signed)
Per EMS [place], c/o "throwing up blood, blood in stool and abdominal pain.  ETOH at 9am after being discharged from hosptial.

## 2020-12-06 NOTE — Discharge Summary (Signed)
Name: Samuel Blevins MRN: [id number] DOB: 06/12/1965 55 y.o. PCP: [name], MD  Date of Admission: 12/04/2020  7:38 AM Date of Discharge: 12/06/2020  8:48 AM Attending Physician: Dr. Charissa Bash   Discharge Diagnosis: 1. Alcohol Withdrawal Seizure 2. Alcohol Use Disorder 3. Iron Deficiency Anemia  4. Hypomagnesemia, Hypokalemia   Discharge Medications: Allergies as of 12/06/2020   No Known Allergies      Medication List     ASK your doctor about these medications    acetaminophen 500 MG tablet Commonly known as: TYLENOL Take 1,000 mg by mouth every 6 (six) hours as needed for mild pain.   citalopram 20 MG tablet Commonly known as: CELEXA TAKE 1 TABLET (20 MG TOTAL) BY MOUTH DAILY.   folic acid 1 MG tablet Commonly known as: FOLVITE Take 1 tablet (1 mg total) by mouth daily.   omeprazole 40 MG capsule Commonly known as: PRILOSEC Take 1 capsule (40 mg total) by mouth 2 (two) times daily before a meal.   ondansetron 8 MG disintegrating tablet Commonly known as: Zofran ODT Take 1 tablet (8 mg total) by mouth every 8 (eight) hours as needed for nausea or vomiting.   thiamine 100 MG tablet Take 1 tablet (100 mg total) by mouth daily.        Disposition and follow-up:   Mr.Kei Briney left AGAINST MEDICAL ADVICE from [facility].  At the hospital follow up visit please address:  1.    Alcohol Use Disorder complicated by Withdrawal Seizure   2.  Labs / imaging needed at time of follow-up: CMP  3.  Pending labs/ test needing follow-up: CSF culture, blood cultures   Hospital Course by problem list: 1. Alcohol Withdrawal Seizure Patient presented to the ED after being found outside shaking and a bystander contacted EMS.  On arrival to the ED, patient was alert and oriented x4 however developed tonic-clonic seizure with the ED provider in the room that was quickly aborted with Ativan.  Patient was noted to be febrile with an  elevated lactic acid concerning for underlying infectious process.  Meningitis work-up initiated in the ED with results all being negative.  Mentation improved with supportive care.  Patient was on CIWA with Ativan as needed and only required 2 doses prior to eloping AGAINST MEDICAL ADVICE.  2. Alcohol Use Disorder Patient has a longstanding history of alcohol use disorder with previous admissions for rehabilitation.  Transitions of care Case manager was able to provide patient with substance abuse resources.  3. Iron Deficiency Anemia  On admission, patient was noted to be anemic at baseline around 8-9.  Iron panel consistent with iron deficiency anemia.  Recommend outpatient follow-up with PCP  4. Hypomagnesemia, Hypokalemia  Secondary to alcohol use disorder and malnutrition.  Replenished while in the hospital.  Discharge Exam:   BP (!) 154/85 (BP Location: Left Arm)   Pulse 85   Temp 99.2 F (37.3 C) (Oral)   Resp 18   Ht 5\' 9"  (1.753 m)   Wt 74.8 kg   SpO2 99%   BMI 24.37 kg/m   Patient left AMA before he could be examined  Pertinent Labs, Studies, and Procedures:  CMP Latest Ref Rng & Units 12/06/2020 12/05/2020 12/04/2020  Glucose 70 - 99 mg/dL 02/03/2021) 88 80  BUN 6 - 20 mg/dL 759(F) <6(B) 5(L)  Creatinine 0.61 - 1.24 mg/dL <8(G) 6.65(L 9.35  Sodium 135 - 145 mmol/L 132(L) 129(L) 133(L)  Potassium 3.5 - 5.1 mmol/L  3.2(L) 3.0(L) 3.0(L)  Chloride 98 - 111 mmol/L 94(L) 92(L) 96(L)  CO2 22 - 32 mmol/L 27 23 24   Calcium 8.9 - 10.3 mg/dL 9.4 ) 2.9(F)  Total Protein 6.5 - 8.1 g/dL - 7.5 -  Total Bilirubin 0.3 - 1.2 mg/dL - 1.7(H) -  Alkaline Phos 38 - 126 U/L - 82 -  AST 15 - 41 U/L - 115(H) -  ALT 0 - 44 U/L - 33 -   CBC Latest Ref Rng & Units 12/06/2020 12/05/2020 12/04/2020  WBC 4.0 - 10.5 K/uL 5.3 6.3 9.6  Hemoglobin 13.0 - 17.0 g/dL 7.7(L) 8.1(L) 8.3(L)  Hematocrit 39.0 - 52.0 % 25.0(L) 25.5(L) 26.3(L)  Platelets 150 - 400 K/uL 196 192 160   Lab Results   Component Value Date   IRON 18 (L) 12/04/2020   TIBC 372 12/04/2020   FERRITIN 20 (L) 06/21/2019   CSF Cell count:  Tube #  1   Color, CSF COLORLESS COLORLESS   Appearance, CSF CLEAR CLEAR Abnormal    Supernatant  NOT INDICATED   RBC Count, CSF 0 /cu mm 241 High    WBC, CSF 0 - 5 /cu mm 4   Other Cells, CSF  TOO FEW TO COUNT, SMEAR AVAILABLE FOR REVIEW   Comment:  RARE NEUTROPHILS, FEW LYMPHOCYTES, RARE MONOCYTES.    DG FL GUIDED LUMBAR PUNCTURE CLINICAL DATA:  Patient with history of altered mental status. Request is for lumbar puncture.  EXAM: DIAGNOSTIC LUMBAR PUNCTURE UNDER FLUOROSCOPIC GUIDANCE  COMPARISON:  None  FLUOROSCOPY TIME:  Fluoroscopy Time: 6 seconds  Radiation Exposure Index (if provided by the fluoroscopic device): 9.9  Number of Acquired Spot Images: 0  PROCEDURE: Informed consent was obtained from the patient prior to the procedure, including potential complications of headache, allergy, and pain. With the patient prone, the lower back was prepped with Betadine. 1% Lidocaine was used for local anesthesia. Lumbar puncture was performed at the L4-5 level using a 20 gauge 3.5 needle with return of clear CSF with an opening pressure of 9 cm water. 7 ml of CSF were obtained for laboratory studies. The patient tolerated the procedure well and there were no apparent complications.  IMPRESSION: Technically successful lumbar puncture.  Electronically Signed   By: 06/23/2019 M.D.   On: 12/04/2020 16:40 CT Head Wo Contrast CLINICAL DATA:  Seizure, nontraumatic (Age >= 41y) Mental status change, unknown cause  EXAM: CT HEAD WITHOUT CONTRAST  TECHNIQUE: Contiguous axial images were obtained from the base of the skull through the vertex without intravenous contrast.  COMPARISON:  08/26/2020.  FINDINGS: Brain: No evidence of acute infarction, hemorrhage, hydrocephalus, extra-axial collection or mass lesion/mass effect.  Vascular: No  hyperdense vessel identified. Calcific intracranial atherosclerosis.  Skull: No acute fracture.  Sinuses/Orbits: Mild paranasal sinus mucosal thickening. Remote left medial orbital wall fracture.  Other: No mastoid effusions.  IMPRESSION: No evidence of acute intracranial abnormality.  Electronically Signed   By: 10/27/2020 M.D.   On: 12/04/2020 13:12 DG Chest Port 1 View CLINICAL DATA:  Provided history: Sepsis, possible seizure.  EXAM: PORTABLE CHEST 1 VIEW  COMPARISON:  Prior chest radiographs 10/03/2020 and earlier.  FINDINGS: Please note small portions of the lateral costophrenic angles are excluded from the field of view bilaterally. Heart size within normal limits. Aortic atherosclerosis. No appreciable airspace consolidation. No evidence of pleural effusion or pneumothorax. No acute bony abnormality identified.  IMPRESSION: Small portions of the lateral costophrenic angles are excluded from the field of view bilaterally.  No evidence of acute cardiopulmonary abnormality.  Aortic Atherosclerosis (ICD10-I70.0).  Electronically Signed   By: Jackey Loge D.O.   On: 12/04/2020 08:21  Signed: Dr. Verdene Lennert Internal Medicine PGY-3  Pager: [phone] After 5pm on weekdays and 1pm on weekends: On Call pager [phone]  12/06/2020, 5:16 PM

## 2020-12-06 NOTE — ED Provider Notes (Signed)
Emergency Medicine Provider Triage Evaluation Note  Samuel Blevins , a 55 y.o. male  was evaluated in triage.  Pt complains of hematemesis.  Was admitted at [facility] long and left AGAINST MEDICAL ADVICE yesterday.  Drank 2 shot glasses yesterday, denies any other alcohol abuse.  He is having hematemesis and bloody stools.  Feels weak, denies any chest pain.  Does have significant abdominal pain..  Medical history notable for alcohol abuse, homelessness, recent admission for sepsis.  Review of Systems  Positive: Abdominal pain, hematemesis, rectal bleeding Negative: Chest pain  Physical Exam  BP 116/82 (BP Location: Left Arm)   Pulse 100   Temp 99.3 F (37.4 C) (Oral)   Resp 17   SpO2 100%  Gen:   Awake, no distress ill-appearing Resp:  Normal effort  MSK:   Moves extremities without difficulty  Other:  Abdomen tender, guarding.  Medical Decision Making  Medically screening exam initiated at 9:11 PM.  Appropriate orders placed.  Samuel Blevins was informed that the remainder of the evaluation will be completed by another provider, this initial triage assessment does not replace that evaluation, and the importance of remaining in the ED until their evaluation is complete.  Patient is borderline febrile and tachycardic, suspect likely sepsis.  Hematemesis and rectal bleeding, will recheck blood counts.  He was admitted for the same 2 days ago.  Will work-up and plan for likely admission.  Did not have CT done last ER stay and is endorsing significant abdominal pain and has guarding on exam.  Will get CT abdomen.   Samuel Arista, PA-C 12/06/20 2113    Samuel Loll, MD 12/07/20 Samuel Blevins

## 2020-12-06 NOTE — Progress Notes (Signed)
Patient left AMA, IV removed, papers signed.

## 2020-12-06 NOTE — Progress Notes (Signed)
Patient states he is leaving today at 40, MD made aware.

## 2020-12-06 NOTE — Progress Notes (Signed)
Entered in error

## 2020-12-07 ENCOUNTER — Telehealth: Payer: Self-pay

## 2020-12-07 ENCOUNTER — Encounter (HOSPITAL_COMMUNITY): Payer: Self-pay | Admitting: Internal Medicine

## 2020-12-07 ENCOUNTER — Emergency Department (HOSPITAL_COMMUNITY): Payer: Self-pay

## 2020-12-07 DIAGNOSIS — D500 Iron deficiency anemia secondary to blood loss (chronic): Secondary | ICD-10-CM

## 2020-12-07 DIAGNOSIS — K852 Alcohol induced acute pancreatitis without necrosis or infection: Principal | ICD-10-CM

## 2020-12-07 DIAGNOSIS — K859 Acute pancreatitis without necrosis or infection, unspecified: Secondary | ICD-10-CM | POA: Diagnosis present

## 2020-12-07 DIAGNOSIS — K7010 Alcoholic hepatitis without ascites: Secondary | ICD-10-CM

## 2020-12-07 LAB — URINALYSIS, ROUTINE W REFLEX MICROSCOPIC
Bacteria, UA: NONE SEEN
Bilirubin Urine: NEGATIVE
Glucose, UA: NEGATIVE mg/dL
Hgb urine dipstick: NEGATIVE
Ketones, ur: NEGATIVE mg/dL
Leukocytes,Ua: NEGATIVE
Nitrite: NEGATIVE
Protein, ur: 30 mg/dL — AB
Specific Gravity, Urine: >1.046 — ABNORMAL HIGH (ref 1.005–1.030)
pH: 6 (ref 5.0–8.0)

## 2020-12-07 LAB — LACTIC ACID, PLASMA: Lactic Acid, Venous: 2 mmol/L (ref 0.5–1.9)

## 2020-12-07 LAB — PROTIME-INR
INR: 0.9 (ref 0.8–1.2)
Prothrombin Time: 12.3 seconds (ref 11.4–15.2)

## 2020-12-07 LAB — BASIC METABOLIC PANEL
Anion gap: 12 (ref 5–15)
BUN: 7 mg/dL (ref 6–20)
CO2: 25 mmol/L (ref 22–32)
Calcium: 8.9 mg/dL (ref 8.9–10.3)
Chloride: 95 mmol/L — ABNORMAL LOW (ref 98–111)
Creatinine, Ser: 0.6 mg/dL — ABNORMAL LOW (ref 0.61–1.24)
GFR, Estimated: >60 mL/min (ref >60)
Glucose, Bld: 93 mg/dL (ref 70–99)
Potassium: 2.9 mmol/L — ABNORMAL LOW (ref 3.5–5.1)
Sodium: 132 mmol/L — ABNORMAL LOW (ref 135–145)

## 2020-12-07 LAB — CBC
HCT: 23.9 % — ABNORMAL LOW (ref 39.0–52.0)
Hemoglobin: 7.6 g/dL — ABNORMAL LOW (ref 13.0–17.0)
MCH: 26.2 pg (ref 26.0–34.0)
MCHC: 31.8 g/dL (ref 30.0–36.0)
MCV: 82.4 fL (ref 80.0–100.0)
Platelets: 251 10*3/uL (ref 150–400)
RBC: 2.9 MIL/uL — ABNORMAL LOW (ref 4.22–5.81)
RDW: 19.7 % — ABNORMAL HIGH (ref 11.5–15.5)
WBC: 5.7 10*3/uL (ref 4.0–10.5)
nRBC: 0 % (ref 0.0–0.2)

## 2020-12-07 LAB — PHOSPHORUS: Phosphorus: 3.5 mg/dL (ref 2.5–4.6)

## 2020-12-07 LAB — CBG MONITORING, ED: Glucose-Capillary: 102 mg/dL — ABNORMAL HIGH (ref 70–99)

## 2020-12-07 LAB — RESP PANEL BY RT-PCR (FLU A&B, COVID) ARPGX2
Influenza A by PCR: NEGATIVE
Influenza B by PCR: NEGATIVE
SARS Coronavirus 2 by RT PCR: NEGATIVE

## 2020-12-07 LAB — MAGNESIUM: Magnesium: 1.4 mg/dL — ABNORMAL LOW (ref 1.7–2.4)

## 2020-12-07 LAB — CSF CULTURE W GRAM STAIN: Culture: NO GROWTH

## 2020-12-07 MED ORDER — LACTATED RINGERS IV SOLN: INTRAVENOUS | Status: DC

## 2020-12-07 MED ORDER — POTASSIUM CHLORIDE 10 MEQ/100ML IV SOLN
INTRAVENOUS | Status: AC
  Filled 2020-12-07: qty 100

## 2020-12-07 MED ORDER — IOHEXOL 350 MG/ML SOLN
100.0000 mL | Freq: Once | INTRAVENOUS | Status: AC | PRN
  Administered 2020-12-07: 100 mL via INTRAVENOUS

## 2020-12-07 MED ORDER — LORAZEPAM 1 MG PO TABS
1.0000 mg | ORAL_TABLET | ORAL | Status: AC | PRN
  Administered 2020-12-07: 2 mg via ORAL
  Administered 2020-12-08: 1 mg via ORAL
  Filled 2020-12-07: qty 1
  Filled 2020-12-07: qty 2

## 2020-12-07 MED ORDER — ONDANSETRON HCL 4 MG/2ML IJ SOLN: 4.0000 mg | Freq: Four times a day (QID) | INTRAMUSCULAR | Status: DC | PRN

## 2020-12-07 MED ORDER — POTASSIUM CHLORIDE 10 MEQ/100ML IV SOLN
10.0000 meq | INTRAVENOUS | Status: AC
  Administered 2020-12-07 (×4): 10 meq via INTRAVENOUS
  Filled 2020-12-07 (×3): qty 100

## 2020-12-07 MED ORDER — FOLIC ACID 1 MG PO TABS
1.0000 mg | ORAL_TABLET | Freq: Every day | ORAL | Status: DC
  Administered 2020-12-07 – 2020-12-12 (×6): 1 mg via ORAL
  Filled 2020-12-07 (×6): qty 1

## 2020-12-07 MED ORDER — THIAMINE HCL 100 MG/ML IJ SOLN
100.0000 mg | Freq: Every day | INTRAMUSCULAR | Status: DC
  Filled 2020-12-07 (×3): qty 2

## 2020-12-07 MED ORDER — HYDROMORPHONE HCL 1 MG/ML IJ SOLN: 0.5000 mg | Freq: Four times a day (QID) | INTRAMUSCULAR | Status: DC | PRN

## 2020-12-07 MED ORDER — LORAZEPAM 2 MG/ML IJ SOLN: 1.0000 mg | INTRAMUSCULAR | Status: AC | PRN

## 2020-12-07 MED ORDER — THIAMINE HCL 100 MG PO TABS
100.0000 mg | ORAL_TABLET | Freq: Every day | ORAL | Status: DC
  Administered 2020-12-07 – 2020-12-12 (×6): 100 mg via ORAL
  Filled 2020-12-07 (×5): qty 1

## 2020-12-07 MED ORDER — ADULT MULTIVITAMIN W/MINERALS CH
1.0000 | ORAL_TABLET | Freq: Every day | ORAL | Status: DC
  Administered 2020-12-07 – 2020-12-12 (×6): 1 via ORAL
  Filled 2020-12-07 (×6): qty 1

## 2020-12-07 MED ORDER — ENOXAPARIN SODIUM 40 MG/0.4ML IJ SOSY
40.0000 mg | PREFILLED_SYRINGE | INTRAMUSCULAR | Status: DC
  Administered 2020-12-08 – 2020-12-11 (×4): 40 mg via SUBCUTANEOUS
  Filled 2020-12-07 (×4): qty 0.4

## 2020-12-07 NOTE — ED Notes (Addendum)
Report given to Lurena Joiner, 75M rn, who confirmed room is ready for pt to come up

## 2020-12-07 NOTE — ED Notes (Signed)
Pt to room 31, states he has had multiple nose bleeds today and throughout the week causing vomiting blood. Pt also states he has had blood from his penis and rectum over the last few days. Pt states he doesn't drink ETOH every day but every other day and drinks a 1/5th of liquor on the days that he does. Pt lying on stretcher, changed into hospital gown. Pt had nose bleed in room from the right nare, no N/V associated. No acute changes noted. Pt given warm blankets and resting with both side rails up. Call bell within reach. Waiting for further orders.

## 2020-12-07 NOTE — ED Notes (Signed)
Pt back from bathroom. Pt had another nose bleed while in the bathroom. Pt denies blowing his nose in the bathroom. Pt gown changed and back in stretcher. No acute changes noted. Will continue to monitor.

## 2020-12-07 NOTE — ED Notes (Signed)
Patient is resting comfortably on stretcher. Lights off, side rails up x2, call bell within reach, urinal at bedside. No acute changes noted. Will continue to monitor.

## 2020-12-07 NOTE — ED Notes (Signed)
Pt's CBG= 102 

## 2020-12-07 NOTE — Progress Notes (Signed)
Patient admitted to 64M this afternoon. Complains of discomfort to abdomen from pancreatitis, needs addressed. Call light and personal belongings within reach. Vic Ripper

## 2020-12-07 NOTE — Progress Notes (Signed)
HD#0 SUBJECTIVE:  Patient Summary: Samuel Blevins is a 55 y.o. with a pertinent PMH of alcohol use disorder with withdrawal seizure, iron deficiency anemia, hepatic steatosis, homelessness, who presented with nausea and abdominal pain after recently leaving AMA from hospitalization for alcohol withdrawal seizure and admitted for acute pancreatitis and epistasis on hospital day 0.   Overnight Events: None  Interim History: Evaluated patient at bedside this afternoon.  He stated that his abdominal pain had improved.  He wished to try eating and drinking.  He reported that his he has had several bloody bowel movements recently.  He reports having a colonoscopy within the last 6 months.  OBJECTIVE:  Vital Signs: Vitals:   12/07/20 1145 12/07/20 1200 12/07/20 1256 12/07/20 1807  BP: (!) 158/88  (!) 161/94 139/72  Pulse: 76 75 82 89  Resp: 15 12 18 20   Temp:   98.6 F (37 C) 98.8 F (37.1 C)  TempSrc:   Oral Oral  SpO2: 96% 99% 100% 98%  Weight:      Height:       Supplemental O2: Room Air SpO2: 98 %  Filed Weights   12/06/20 2119  Weight: 79.4 kg     Intake/Output Summary (Last 24 hours) at 12/07/2020 1820 Last data filed at 12/07/2020 1122 Gross per 24 hour  Intake 387.13 ml  Output --  Net 387.13 ml   Net IO Since Admission: 387.13 mL [12/07/20 1820]  Physical Exam: Physical Exam Constitutional:      General: He is not in acute distress.    Appearance: He is not ill-appearing.  HENT:     Head: Normocephalic and atraumatic.     Mouth/Throat:     Mouth: Mucous membranes are moist.  Cardiovascular:     Rate and Rhythm: Normal rate and regular rhythm.     Heart sounds: Normal heart sounds.  Pulmonary:     Effort: Pulmonary effort is normal.     Breath sounds: Normal breath sounds.  Abdominal:     General: There is distension.     Tenderness: There is abdominal tenderness.     Comments: Resonance percussed diffusely, bowel sounds decreased in left lower quadrant   Musculoskeletal:        General: Normal range of motion.     Comments: Nodules palpated at joint of manubrium and rib on level 2-3, symmetric.  No associated tenderness  Skin:    General: Skin is warm and dry.  Neurological:     General: No focal deficit present.     Mental Status: He is alert and oriented to person, place, and time.  Psychiatric:        Mood and Affect: Mood normal.        Behavior: Behavior normal.    Patient Lines/Drains/Airways Status     Active Line/Drains/Airways     Name Placement date Placement time Site Days   Peripheral IV 12/07/20 20 G 1" Anterior;Left Forearm 12/07/20  0037  Forearm  less than 1            Pertinent Labs: CBC Latest Ref Rng & Units 12/07/2020 12/06/2020 12/06/2020  WBC 4.0 - 10.5 K/uL 5.7 6.4 5.3  Hemoglobin 13.0 - 17.0 g/dL 7.6(L) 7.9(L) 7.7(L)  Hematocrit 39.0 - 52.0 % 23.9(L) 25.8(L) 25.0(L)  Platelets 150 - 400 K/uL 251 247 196    CMP Latest Ref Rng & Units 12/07/2020 12/06/2020 12/06/2020  Glucose 70 - 99 mg/dL 93 02/05/2021) 322(G)  BUN 6 - 20 mg/dL 7  8 <5(L)  Creatinine 0.61 - 1.24 mg/dL 1.30(Q) 6.57 8.46(N)  Sodium 135 - 145 mmol/L 132(L) 134(L) 132(L)  Potassium 3.5 - 5.1 mmol/L 2.9(L) 3.1(L) 3.2(L)  Chloride 98 - 111 mmol/L 95(L) 95(L) 94(L)  CO2 22 - 32 mmol/L 25 28 27   Calcium 8.9 - 10.3 mg/dL 8.9 9.8 9.4  Total Protein 6.5 - 8.1 g/dL - 7.9 -  Total Bilirubin 0.3 - 1.2 mg/dL - 0.7 -  Alkaline Phos 38 - 126 U/L - 97 -  AST 15 - 41 U/L - 127(H) -  ALT 0 - 44 U/L - 38 -    Recent Labs    12/07/20 0738  GLUCAP 102*     Pertinent Imaging: CT ABDOMEN PELVIS W CONTRAST  Result Date: 12/07/2020 CLINICAL DATA:  Hematemesis, bright red blood per rectum, abdominal pain. EXAM: CT ABDOMEN AND PELVIS WITH CONTRAST TECHNIQUE: Multidetector CT imaging of the abdomen and pelvis was performed using the standard protocol following bolus administration of intravenous contrast. CONTRAST:  12/09/2020 OMNIPAQUE IOHEXOL 350 MG/ML  SOLN COMPARISON:  09/16/2020 FINDINGS: Lower chest: The visualized lung bases are clear. The visualized heart and pericardium are unremarkable. Hepatobiliary: Severe hepatic steatosis. Mild hepatomegaly. No focal intrahepatic mass identified. No intra or extrahepatic biliary ductal dilation. Gallbladder unremarkable. Pancreas: There is mild peripancreatic inflammatory fluid seen within the pancreatico duodenal groove in keeping with acute groove pancreatitis. The pancreatic duct is not dilated. No peripancreatic inflammatory changes surrounding the body and tail the pancreas. Several ill-defined low-attenuation regions within the head of the pancreas may relate to interstitial edema, or scattered foci of parenchymal necrosis but are too small to accurately characterize. No loculated peripancreatic fluid collections are identified there is no evidence of peripancreatic necrosis. Spleen: Unremarkable Adrenals/Urinary Tract: Adrenal glands are unremarkable. Kidneys are normal, without renal calculi, focal lesion, or hydronephrosis. Bladder is unremarkable. Stomach/Bowel: Stomach is within normal limits. Appendix appears normal. No evidence of bowel wall thickening, distention, or inflammatory changes. No free air. Vascular/Lymphatic: Moderate aortoiliac atherosclerotic calcification. No aortic aneurysm. No pathologic adenopathy within the abdomen and pelvis. Reproductive: Mild prostatic hypertrophy with central indentation of the base of the bladder. Other: No abdominal wall hernia. Musculoskeletal: Degenerative changes are seen within the lumbar spine. No acute bone abnormality. IMPRESSION: Groove pancreatitis. No loculated peripancreatic fluid collections. Small foci of hypoattenuation within the pancreatic head may represent areas of interstitial edema or tiny foci of parenchymal necrosis but are not well characterized at this time. This can be reassessed on subsequent imaging. Severe hepatic steatosis and mild  hepatomegaly, unchanged. Aortic Atherosclerosis (ICD10-I70.0). Electronically Signed   By: 09/18/2020 M.D.   On: 12/07/2020 01:28    ASSESSMENT/PLAN:  Assessment: Active Problems:   Pancreatitis   Alcoholic steatohepatitis   Iron deficiency anemia due to chronic blood loss  Samuel Blevins is a 55 y.o. with a pertinent PMH of alcohol use disorder with withdrawal seizure, iron deficiency anemia, hepatic steatosis, homelessness, who presented with nausea and abdominal pain after recently leaving AMA from hospitalization for alcohol withdrawal seizure and admitted for acute pancreatitis and epistasis on hospital day 0.   Plan: #Acute on chronic pancreatitis Presented due to abdominal pain, nausea, and vomiting.  In ED is vital signs are stable without tachycardia, tachypnea, or hypoxia.  Lipase elevated at 175.  Kidney function normal.  CT abdomen pelvis revealed groove pancreatitis with unremarkable gallbladder, with no loculated peripancreatic fluid collections.  Small foci of hypoattenuation within the pancreatic head likely representing edema or tiny foci  of parenchymal necrosis.  Episode of pancreatitis likely secondary to alcohol use.  -IV LR 100 cc/h -Dilaudid 0.5 mg every 6 hours as needed for pain -Zofran for nausea -Patient tolerated clear liquids and crackers this afternoon, started soft diet with instructions for low-fat  #Hematemesis#Bloody stool#iron deficiency anemia Patient reported small amount of blood in his emesis since that only happened once.  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  No further episodes of hematemesis.  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  Hemoglobin decreased from recent hospitalization, today at 7.6.  Patient reports episodes of blood in his stool.  Patient reports receiving colonoscopy 6 months ago, however this was not reported in epic.  Differentials include upper GI bleed vs  internal hemorrhoids vs. colon source of bleeding.  Hemoglobin continues to decrease will consider consulting GI.   -Hemoglobin continues to decrease will consider consulting GI -Needs outpatient colonoscopy  #Alcohol use disorder Patient reports feeling anxious and clammy.  States that his last drink was yesterday afternoon.  -CIWA protocol with Ativan  Best Practice: Diet: Soft diet IVF: Fluids: LR, Rate: 100 mL/hr VTE: enoxaparin (LOVENOX) injection 40 mg Start: 12/07/20 0800 Code: Full AB: none Therapy Recs: Pending Family Contact: none DISPO: Anticipated discharge passed on medical stability.  Signature: Rudene Christians, D.O. Internal Medicine Resident, PGY-1 [facility]  Pager: [phone] 6:20 PM, 12/07/2020   Please contact the on call pager after 5 pm and on weekends at [phone].

## 2020-12-07 NOTE — ED Notes (Signed)
Patient is resting comfortably on stretcher. No acute changes noted. Will continue to monitor.  

## 2020-12-07 NOTE — ED Notes (Signed)
Pt ambulatory to the bathroom to collect urine specimen. 

## 2020-12-07 NOTE — ED Notes (Signed)
ED Provider at bedside. 

## 2020-12-07 NOTE — H&P (Signed)
Date: 12/07/2020               Patient Name:  Samuel Blevins MRN: [id number]  DOB: December 26, 1965 Age / Sex: 55 y.o., male   PCP: [name], MD         Medical Service: Internal Medicine Teaching Service         Attending Physician: Dr. Mayford Knife, Dorene Ar, MD    First Contact: Dr. Welton Flakes Pager: [phone]  Second Contact: Dr. Huel Cote Pager: [phone]       After Hours (After 5p/  First Contact Pager: [phone]  weekends / holidays): Second Contact Pager: [phone]   Chief Complaint: Abdominal pain  History of Present Illness:   Samuel Blevins is a 55 year old gentleman with past medical history of alcohol use disorder with withdrawal seizure, iron deficiency anemia, hepatic steatosis who left AMA from a recent hospitalization for alcohol withdrawal seizure and came back to the ED for acute onsets abdominal pain, nausea and vomiting, likely due to an episode of acute on chronic pancreatitis.  States that he drank 1 airplane bottle of liquor yesterday morning after leaving the hospital.  Report constant abdominal pain that radiates to the back.  Report nausea and vomiting and a small amount of blood in his vomit yesterday which only happens once.  Also reports 2 episodes of nosebleed.  Denies any chest pain.  Reports shortness of breath with sleeping.  Also report diarrhea.  Denies dysuria but endorses some blood in his urine.  Denies any episode of seizure or withdraw yesterday or today.  In the ED, his vital signs are stable without tachycardia, tachypnea or hypoxia.  Lipase elevated at 175.  Kidney function normal.  CT abdomen pelvis revealed groove pancreatitis with unremarkable gallbladder.  Meds:  No outpatient medications have been marked as taking for the 12/06/20 encounter [facility] Encounter).     Allergies: Allergies as of 12/06/2020   (No Known Allergies)   Past Medical History:  Diagnosis Date   Alcohol withdrawal syndrome with complication (HCC)    Alcoholism  (HCC)    Elevated AST (SGOT)    Homeless    Hypertension    Symptomatic anemia 11/23/2015    Family History:  Family History  Problem Relation Age of Onset   Diabetes Mellitus II Neg Hx    Colon cancer Neg Hx    Stomach cancer Neg Hx    Pancreatic cancer Neg Hx      Social History:  Tobacco: 1 pack a day Alcohol: 1 pint a day Denies illicit drugs use  Review of Systems: A complete ROS was negative except as per HPI.   Physical Exam: Blood pressure 139/80, pulse 91, temperature 98.4 F (36.9 C), temperature source Oral, resp. rate 20, height 5\' 8"  (1.727 m), weight 79.4 kg, SpO2 98 %. Physical Exam Constitutional:      General: He is not in acute distress.    Appearance: Normal appearance. He is not ill-appearing or toxic-appearing.  HENT:     Head: Normocephalic.  Eyes:     General: No scleral icterus.       Right eye: No discharge.        Left eye: No discharge.     Conjunctiva/sclera: Conjunctivae normal.  Cardiovascular:     Rate and Rhythm: Normal rate and regular rhythm.     Heart sounds: Normal heart sounds. No murmur heard.    Comments: No LE edema Pulmonary:     Effort: Pulmonary effort is normal. No  respiratory distress.     Breath sounds: Normal breath sounds. No wheezing.     Comments: A small, smooth, movable palpated on the left lower sternum.  Mild tenderness to palpation Abdominal:     General: Bowel sounds are normal.     Palpations: Abdomen is soft.     Tenderness: There is no guarding.     Comments: Tenderness to palpation in the epigastric area.  Negative Murphy sign.  No tenderness to palpation in the lower back  Musculoskeletal:        General: No deformity or signs of injury. Normal range of motion.     Cervical back: Normal range of motion.     Right lower leg: No edema.     Left lower leg: No edema.     Comments: No tremors or diaphoresis on exam.  Skin:    General: Skin is warm and dry.     Coloration: Skin is not jaundiced.      Findings: No bruising.  Neurological:     General: No focal deficit present.     Mental Status: He is alert and oriented to person, place, and time.  Psychiatric:        Mood and Affect: Mood normal.        Behavior: Behavior normal.        Thought Content: Thought content normal.     EKG: personally reviewed my interpretation is as tachycardia  CT abdomen/pelvis Groove pancreatitis. No loculated peripancreatic fluid collections. Small foci of hypoattenuation within the pancreatic head may represent areas of interstitial edema or tiny foci of parenchymal necrosis but are not well characterized at this time. This can be reassessed on subsequent imaging.   Severe hepatic steatosis and mild hepatomegaly, unchanged.   Samuel Blevins is a 55 year old gentleman with past medical history of alcohol use disorder with withdrawal seizure, iron deficiency anemia, hepatic steatosis who left AMA from a recent hospitalization for alcohol withdrawal seizure and came back to the ED for acute onsets abdominal pain, nausea and vomiting, likely due to an episode of acute on chronic pancreatitis.  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  Lipase remains elevated at 175. Gallbladder is unremarkable on CT scan.  Total bili normal.  This episode of pancreatitis likely secondary to alcohol use. -IV LR 100 cc/h -Dilaudid 0.5 mg every 6 hours as needed for pain -Zofran for nausea -N.p.o. for now.  Can resume clear liquid diet if tolerate  Alcohol use disorder Longstanding history of alcohol use disorder and withdrawal seizure.  Currently not tachycardic, hypertensive, tremor or diaphoresis.  No signs of active withdrawal. -CIWA without Ativan -Check mag and Phos  Hematemesis He reports a small amount of blood in his emesis, which only happened  once.  Differential include Mallory-Weiss tear vs gastritis vs esophagitis.  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  Given the cessation of hematemesis and stable hemoglobin, will continue to observe for now.  Can consider consult GI if hematemesis happens again.  Hematuria Report blood in his urine.  UA was negative for hematuria.  Patient denies dysuria. -Monitor for now  Iron deficiency anemia Hemoglobin stable at 7.6, slight downtrending.  He does have history of upper GI bleed in 2021. -Will likely need outpatient colonoscopy  Hypokalemia Potassium 3.1 -Repleted -Check  mag  Hepatic steatosis In the setting of alcohol use.  Unchanged appearance on CT. -Outpatient work-up  Full code DVT: Lovenox Diet: N.p.o. IVF: LR  Dispo: Admit patient to Inpatient with expected length of stay greater than 2 midnights.  SignedDoran Stabler, DO 12/07/2020, 6:49 AM  Pager: [phone] After 5pm on weekdays and 1pm on weekends: On Call pager: [phone]

## 2020-12-07 NOTE — ED Provider Notes (Signed)
[facility] EMERGENCY DEPARTMENT Provider Note   CSN: [id number] Arrival date & time: 12/06/20  2019     History Chief Complaint  Patient presents with   Hematemesis    Samuel Blevins is a 55 y.o. male.  Patient with a history of alcohol dependence, homelessness, small bowel AVM, HTN, pancreatitis, GI bleed, returns to the hospital after signing out AMA from admission for likely alcohol withdrawal seizure. He reports that after he left he started having nausea with hematemesis and bloody stools. He reports he had vomiting while in the hospital and should never have left, returning now for continued care. He endorses alcohol use after he left. He also reports persistent lower abdominal pain.  The history is provided by the patient. No language interpreter was used.      Past Medical History:  Diagnosis Date   Alcohol withdrawal syndrome with complication (HCC)    Alcoholism (HCC)    Elevated AST (SGOT)    Homeless    Hypertension    Symptomatic anemia 11/23/2015    Patient Active Problem List   Diagnosis Date Noted   Seizure (HCC) 12/04/2020   Gastrointestinal hemorrhage    Acute pancreatitis 09/16/2020   Alcohol withdrawal (HCC) 09/16/2020   Chronic pain syndrome 12/28/2019   Pancreatic pseudocyst 10/28/2019   Pancreatitis, acute 10/28/2019   Gastritis and gastroduodenitis    AVM (arteriovenous malformation) of small bowel, acquired    ETOH abuse 06/21/2019   Tobacco abuse 03/17/2014   Essential hypertension 03/17/2014    Past Surgical History:  Procedure Laterality Date   BIOPSY  06/23/2019   Procedure: BIOPSY;  Surgeon: Shellia Cleverly, DO;  Location: [facility] ENDOSCOPY;  Service: Gastroenterology;;   ESOPHAGOGASTRODUODENOSCOPY (EGD) WITH PROPOFOL N/A 06/23/2019   Procedure: ESOPHAGOGASTRODUODENOSCOPY (EGD) WITH PROPOFOL;  Surgeon: Shellia Cleverly, DO;  Location: [facility] ENDOSCOPY;  Service: Gastroenterology;  Laterality: N/A;   HOT HEMOSTASIS N/A  06/23/2019   Procedure: HOT HEMOSTASIS (ARGON PLASMA COAGULATION/BICAP);  Surgeon: Shellia Cleverly, DO;  Location: [facility] ENDOSCOPY;  Service: Gastroenterology;  Laterality: N/A;       Family History  Problem Relation Age of Onset   Diabetes Mellitus II Neg Hx    Colon cancer Neg Hx    Stomach cancer Neg Hx    Pancreatic cancer Neg Hx     Social History   Tobacco Use   Smoking status: Every Day    Packs/day: 1.00    Years: 30.00    Pack years: 30.00    Types: Cigarettes   Smokeless tobacco: Never  Vaping Use   Vaping Use: Never used  Substance Use Topics   Alcohol use: Yes   Drug use: Never    Home Medications Prior to Admission medications   Medication Sig Start Date End Date Taking? Authorizing Provider  citalopram (CELEXA) 20 MG tablet TAKE 1 TABLET (20 MG TOTAL) BY MOUTH DAILY. Patient not taking: No sig reported 02/02/20 02/01/21  [name], MD  folic acid (FOLVITE) 1 MG tablet Take 1 tablet (1 mg total) by mouth daily. Patient not taking: No sig reported 05/03/20   [name], MD  omeprazole (PRILOSEC) 40 MG capsule Take 1 capsule (40 mg total) by mouth 2 (two) times daily before a meal. Patient not taking: No sig reported 12/15/19   [name], MD  ondansetron (ZOFRAN ODT) 8 MG disintegrating tablet Take 1 tablet (8 mg total) by mouth every 8 (eight) hours as needed for nausea or vomiting. Patient not taking:  No sig reported 05/03/20   [name], MD  thiamine 100 MG tablet Take 1 tablet (100 mg total) by mouth daily. Patient not taking: No sig reported 05/03/20   [name], MD    Allergies    Patient has no known allergies.  Review of Systems   Review of Systems  Constitutional:  Negative for chills and fever.  HENT:  Positive for nosebleeds.   Respiratory: Negative.  Negative for shortness of breath.   Cardiovascular: Negative.  Negative for chest pain.  Gastrointestinal:  Positive for abdominal pain, blood in stool and  vomiting.  Musculoskeletal: Negative.   Skin: Negative.   Neurological:  Positive for weakness. Negative for seizures and syncope.   Physical Exam Updated Vital Signs BP (!) 142/83   Pulse 86   Temp 98.5 F (36.9 C)   Resp 17   Ht 5\' 8"  (1.727 m)   Wt 79.4 kg   SpO2 95%   BMI 26.61 kg/m   Physical Exam Vitals and nursing note reviewed.  Constitutional:      Appearance: He is well-developed.  HENT:     Head: Normocephalic.  Cardiovascular:     Rate and Rhythm: Normal rate and regular rhythm.     Heart sounds: No murmur heard. Pulmonary:     Effort: Pulmonary effort is normal.     Breath sounds: Normal breath sounds. No wheezing, rhonchi or rales.  Abdominal:     General: Bowel sounds are normal.     Palpations: Abdomen is soft.     Tenderness: There is abdominal tenderness (Tenderness to soft abdomen across lower quadrants.). There is no guarding or rebound.  Musculoskeletal:        General: Normal range of motion.     Cervical back: Normal range of motion and neck supple.  Skin:    General: Skin is warm and dry.  Neurological:     General: No focal deficit present.     Mental Status: He is alert and oriented to person, place, and time.    ED Results / Procedures / Treatments   Labs (all labs ordered are listed, but only abnormal results are displayed) Labs Reviewed  CBC WITH DIFFERENTIAL/PLATELET - Abnormal; Notable for the following components:      Result Value   RBC 3.07 (*)    Hemoglobin 7.9 (*)    HCT 25.8 (*)    MCH 25.7 (*)    RDW 19.5 (*)    All other components within normal limits  COMPREHENSIVE METABOLIC PANEL - Abnormal; Notable for the following components:   Sodium 134 (*)    Potassium 3.1 (*)    Chloride 95 (*)    Glucose, Bld 103 (*)    Albumin 3.2 (*)    AST 127 (*)    All other components within normal limits  LIPASE, BLOOD - Abnormal; Notable for the following components:   Lipase 175 (*)    All other components within normal limits   URINALYSIS, ROUTINE W REFLEX MICROSCOPIC - Abnormal; Notable for the following components:   Specific Gravity, Urine >1.046 (*)    Protein, ur 30 (*)    All other components within normal limits  LACTIC ACID, PLASMA - Abnormal; Notable for the following components:   Lactic Acid, Venous 2.0 (*)    All other components within normal limits  RESP PANEL BY RT-PCR (FLU A&B, COVID) ARPGX2  CULTURE, BLOOD (ROUTINE X 2)  CULTURE, BLOOD (ROUTINE X 2)  LACTIC ACID, PLASMA  PROTIME-INR  CBC  TYPE AND SCREEN    EKG EKG Interpretation  Date/Time:  Wednesday December 06 2020 21:16:03 EDT Ventricular Rate:  111 PR Interval:  134 QRS Duration: 92 QT Interval:  328 QTC Calculation: 446 R Axis:   90 Text Interpretation: Sinus tachycardia Rightward axis Borderline ECG Interpretation limited secondary to artifact Confirmed by Zadie Rhine ([id number]) on 12/07/2020 1:04:11 AM  Radiology CT ABDOMEN PELVIS W CONTRAST  Result Date: 12/07/2020 CLINICAL DATA:  Hematemesis, bright red blood per rectum, abdominal pain. EXAM: CT ABDOMEN AND PELVIS WITH CONTRAST TECHNIQUE: Multidetector CT imaging of the abdomen and pelvis was performed using the standard protocol following bolus administration of intravenous contrast. CONTRAST:  OMNIPAQUE IOHEXOL 350 MG/ML SOLN COMPARISON:  09/16/2020 FINDINGS: Lower chest: The visualized lung bases are clear. The visualized heart and pericardium are unremarkable. Hepatobiliary: Severe hepatic steatosis. Mild hepatomegaly. No focal intrahepatic mass identified. No intra or extrahepatic biliary ductal dilation. Gallbladder unremarkable. Pancreas: There is mild peripancreatic inflammatory fluid seen within the pancreatico duodenal groove in keeping with acute groove pancreatitis. The pancreatic duct is not dilated. No peripancreatic inflammatory changes surrounding the body and tail the pancreas. Several ill-defined low-attenuation regions within the head of the pancreas  may relate to interstitial edema, or scattered foci of parenchymal necrosis but are too small to accurately characterize. No loculated peripancreatic fluid collections are identified there is no evidence of peripancreatic necrosis. Spleen: Unremarkable Adrenals/Urinary Tract: Adrenal glands are unremarkable. Kidneys are normal, without renal calculi, focal lesion, or hydronephrosis. Bladder is unremarkable. Stomach/Bowel: Stomach is within normal limits. Appendix appears normal. No evidence of bowel wall thickening, distention, or inflammatory changes. No free air. Vascular/Lymphatic: Moderate aortoiliac atherosclerotic calcification. No aortic aneurysm. No pathologic adenopathy within the abdomen and pelvis. Reproductive: Mild prostatic hypertrophy with central indentation of the base of the bladder. Other: No abdominal wall hernia. Musculoskeletal: Degenerative changes are seen within the lumbar spine. No acute bone abnormality. IMPRESSION: Groove pancreatitis. No loculated peripancreatic fluid collections. Small foci of hypoattenuation within the pancreatic head may represent areas of interstitial edema or tiny foci of parenchymal necrosis but are not well characterized at this time. This can be reassessed on subsequent imaging. Severe hepatic steatosis and mild hepatomegaly, unchanged. Aortic Atherosclerosis (ICD10-I70.0). Electronically Signed   By: Helyn Numbers M.D.   On: 12/07/2020 01:28    Procedures Procedures   Medications Ordered in ED Medications  iohexol (OMNIPAQUE) 350 MG/ML injection 100 mL (100 mLs Intravenous Contrast Given 12/07/20 0118)    ED Course  I have reviewed the triage vital signs and the nursing notes.  Pertinent labs & imaging results that were available during my care of the patient were reviewed by me and considered in my medical decision making (see chart for details).    MDM Rules/Calculators/A&P                           Patient to ED after leaving yesterday  from admission for likely alcohol withdrawal. He endorses vomiting and abdominal pain while in the hospital that continued after he left with onset of hematemesis. He reports bloody stools and nosebleeds as well.   No vomiting in ED. He has had nosebleeds x 2. No bleeding now. Hgb slightly lower than when admitted on 10/10 at 7.9. CBC repeated with down trending hgb now 7.6. Coags normal. During the hospitalization, there was a question of sepsis with elevated lactic acid and fever. Tonight there is no fever  and lactic acid 1.9 and 2.0. Doubt sepsis.   He has abdominal tenderness and CT evidence of acute pancreatitis.  The patient is seen by Dr. Bebe Shaggy who feels he would benefit from re-admission given history of GI bleed, hgb down trending, acute pancreatitis. This was discussed with [facility] admitting resident who will see the patient.   Final Clinical Impression(s) / ED Diagnoses Final diagnoses:  None   Acute pancreatitis Anemia   Rx / DC Orders ED Discharge Orders     None        Elpidio Anis, PA-C 12/07/20 0630    Zadie Rhine, MD 12/07/20 [id number]

## 2020-12-07 NOTE — ED Notes (Signed)
ED TO INPATIENT HANDOFF REPORT  ED Nurse Name and Phone #: [phone] [phone]  S Name/Age/Gender [name] 55 y.o. male Room/Bed: 005C/005C  Code Status   Code Status: Full Code  Home/SNF/Other Home Patient oriented to: self, place, time, and situation Is this baseline? Yes   Triage Complete: Triage complete  Chief Complaint Pancreatitis [K85.90]  Triage Note Per EMS pt found at gas station, c/o "throwing up blood, blood in stool and abdominal pain.  ETOH at 9am after being discharged from hosptial.   Allergies No Known Allergies  Level of Care/Admitting Diagnosis ED Disposition     ED Disposition  Admit   Condition  --   Comment  Hospital Area: [facility] [[id number]]  Level of Care: Telemetry Medical [104]  May admit [facility] to [facility] if equivalent level of care is available:: No  Covid Evaluation: Confirmed COVID Negative  Diagnosis: Pancreatitis [161096]  Admitting Physician: Miguel Aschoff [[id number]]  Attending Physician: Miguel Aschoff [[id number]]  Estimated length of stay: past midnight tomorrow  Certification:: I certify this patient will need inpatient services for at least 2 midnights          B Medical/Surgery History Past Medical History:  Diagnosis Date   Alcohol withdrawal syndrome with complication (HCC)    Alcoholism (HCC)    Elevated AST (SGOT)    Homeless    Hypertension    Symptomatic anemia 11/23/2015   Past Surgical History:  Procedure Laterality Date   BIOPSY  06/23/2019   Procedure: BIOPSY;  Surgeon: Shellia Cleverly, DO;  Location: [facility] END[facility];  Service: Gastroenterology;;   ESOPHAGOGASTRODUODENOSCOPY (EGD) WITH PROPOFOL N/A 06/23/2019   Procedure: ESOPHAGOGASTRODUODENOSCOPY (EGD) WITH PROPOFOL;  Surgeon: Shellia Cleverly, DO;  Location: [facility];  Service: Gastroenterology;  Laterality: N/A;   HOT HEMOSTASIS N/A 06/23/2019   Procedure: HOT HEMOSTASIS (ARGON PLASMA COAGULATION/BICAP);   Surgeon: Shellia Cleverly, DO;  Location: [facility] ENDOSCOPY;  Service: Gastroenterology;  Laterality: N/A;     A IV Location/Drains/Wounds Patient Lines/Drains/Airways Status     Active Line/Drains/Airways     Name Placement date Placement time Site Days   Peripheral IV 12/07/20 20 G 1" Anterior;Left Forearm 12/07/20  0037  Forearm  less than 1            Intake/Output Last 24 hours  Intake/Output Summary (Last 24 hours) at 12/07/2020 1203 Last data filed at 12/07/2020 1122 Gross per 24 hour  Intake 387.13 ml  Output --  Net 387.13 ml    Labs/Imaging Results for orders placed or performed during the hospital encounter of 12/06/20 (from the past 48 hour(s))  Lactic acid, plasma     Status: None   Collection Time: 12/06/20  9:10 PM  Result Value Ref Range   Lactic Acid, Venous 1.9 0.5 - 1.9 mmol/L    Comment: Performed at [facility], [address], [place], [place] [address]  Type and screen [facility]     Status: None   Collection Time: 12/06/20  9:25 PM  Result Value Ref Range   ABO/RH(D) A POS    Antibody Screen NEG    Sample Expiration      12/09/2020,2359 Performed at [facility], [address], [place], [place] [address]   CBC with Differential     Status: Abnormal   Collection Time: 12/06/20  9:26 PM  Result Value Ref Range   WBC 6.4 4.0 - 10.5 K/uL   RBC  3.07 (L) 4.22 - 5.81 MIL/uL   Hemoglobin 7.9 (L) 13.0 - 17.0 g/dL   HCT 40.9 (L) 81.1 - 91.4 %   MCV 84.0 80.0 - 100.0 fL   MCH 25.7 (L) 26.0 - 34.0 pg   MCHC 30.6 30.0 - 36.0 g/dL   RDW 78.2 (H) 95.6 - 21.3 %   Platelets 247 150 - 400 K/uL   nRBC 0.0 0.0 - 0.2 %   Neutrophils Relative % 70 %   Neutro Abs 4.5 1.7 - 7.7 K/uL   Lymphocytes Relative 17 %   Lymphs Abs 1.1 0.7 - 4.0 K/uL   Monocytes Relative 13 %   Monocytes Absolute 0.8 0.1 - 1.0 K/uL   Eosinophils Relative 0 %   Eosinophils Absolute 0.0 0.0 - 0.5 K/uL   Basophils Relative 0 %   Basophils Absolute 0.0  0.0 - 0.1 K/uL   nRBC 0 0 /100 WBC   Abs Immature Granulocytes 0.00 0.00 - 0.07 K/uL   Target Cells PRESENT     Comment: Performed at [facility] Lab, [address], [place], [place] [address]  Comprehensive metabolic panel     Status: Abnormal   Collection Time: 12/06/20  9:26 PM  Result Value Ref Range   Sodium 134 (L) 135 - 145 mmol/L   Potassium 3.1 (L) 3.5 - 5.1 mmol/L   Chloride 95 (L) 98 - 111 mmol/L   CO2 28 22 - 32 mmol/L   Glucose, Bld 103 (H) 70 - 99 mg/dL    Comment: Glucose reference range applies only to samples taken after fasting for at least 8 hours.   BUN 8 6 - 20 mg/dL   Creatinine, Ser 8.46 0.61 - 1.24 mg/dL   Calcium 9.8 8.9 - 96.2 mg/dL   Total Protein 7.9 6.5 - 8.1 g/dL   Albumin 3.2 (L) 3.5 - 5.0 g/dL   AST 952 (H) 15 - 41 U/L   ALT 38 0 - 44 U/L   Alkaline Phosphatase 97 38 - 126 U/L   Total Bilirubin 0.7 0.3 - 1.2 mg/dL   GFR, Estimated >84 >13 mL/min    Comment: (NOTE) Calculated using the CKD-EPI Creatinine Equation (2021)    Anion gap 11 5 - 15    Comment: Performed at [facility], [address], [place], [place] [address]  Lipase, blood     Status: Abnormal   Collection Time: 12/06/20  9:26 PM  Result Value Ref Range   Lipase 175 (H) 11 - 51 U/L    Comment: Performed at [facility], [address], [place], [place] [address]  Blood culture (routine x 2)     Status: None (Preliminary result)   Collection Time: 12/06/20  9:26 PM   Specimen: BLOOD  Result Value Ref Range   Specimen Description BLOOD LEFT ANTECUBITAL    Special Requests      BOTTLES DRAWN AEROBIC AND ANAEROBIC Blood Culture adequate volume   Culture      NO GROWTH < 12 HOURS Performed at [facility], [address], [place], [place] [address]    Report Status PENDING   Resp Panel by RT-PCR (Flu A&B, Covid) Nasopharyngeal Swab     Status: None   Collection Time: 12/07/20 12:35 AM   Specimen: Nasopharyngeal Swab; Nasopharyngeal(NP) swabs in vial  transport medium  Result Value Ref Range   SARS Coronavirus 2 by RT PCR NEGATIVE NEGATIVE    Comment: (NOTE) SARS-CoV-2 target nucleic acids are NOT  DETECTED.  The SARS-CoV-2 RNA is generally detectable in upper respiratory specimens during the acute phase of infection. The lowest concentration of SARS-CoV-2 viral copies this assay can detect is 138 copies/mL. A negative result does not preclude SARS-Cov-2 infection and should not be used as the sole basis for treatment or other patient management decisions. A negative result may occur with  improper specimen collection/handling, submission of specimen other than nasopharyngeal swab, presence of viral mutation(s) within the areas targeted by this assay, and inadequate number of viral copies(<138 copies/mL). A negative result must be combined with clinical observations, patient history, and epidemiological information. The expected result is Negative.  Fact Sheet for Patients:  BloggerCourse.com  Fact Sheet for Healthcare Providers:  SeriousBroker.it  This test is no t yet approved or cleared by the [place] FDA and  has been authorized for detection and/or diagnosis of SARS-CoV-2 by FDA under an Emergency Use Authorization (EUA). This EUA will remain  in effect (meaning this test can be used) for the duration of the COVID-19 declaration under Section 564(b)(1) of the Act, 21 U.S.C.section 360bbb-3(b)(1), unless the authorization is terminated  or revoked sooner.       Influenza A by PCR NEGATIVE NEGATIVE   Influenza B by PCR NEGATIVE NEGATIVE    Comment: (NOTE) The Xpert Xpress SARS-CoV-2/FLU/RSV plus assay is intended as an aid in the diagnosis of influenza from Nasopharyngeal swab specimens and should not be used as a sole basis for treatment. Nasal washings and aspirates are unacceptable for Xpert Xpress SARS-CoV-2/FLU/RSV testing.  Fact Sheet for  Patients: BloggerCourse.com  Fact Sheet for Healthcare Providers: SeriousBroker.it  This test is not yet approved or cleared by the [place] FDA and has been authorized for detection and/or diagnosis of SARS-CoV-2 by FDA under an Emergency Use Authorization (EUA). This EUA will remain in effect (meaning this test can be used) for the duration of the COVID-19 declaration under Section 564(b)(1) of the Act, 21 U.S.C. section 360bbb-3(b)(1), unless the authorization is terminated or revoked.  Performed at [facility], [address], [place], [place] [address]   Lactic acid, plasma     Status: Abnormal   Collection Time: 12/07/20 12:37 AM  Result Value Ref Range   Lactic Acid, Venous 2.0 (HH) 0.5 - 1.9 mmol/L    Comment: CRITICAL RESULT CALLED TO, READ BACK BY AND VERIFIED WITH: Sullivan Lone RN 12/07/20 0114 [place] D[place] Performed at [facility] Lab, [address], [place], [place] [address]   Urinalysis, Routine w reflex microscopic Urine, Clean Catch     Status: Abnormal   Collection Time: 12/07/20  1:51 AM  Result Value Ref Range   Color, Urine YELLOW YELLOW   APPearance CLEAR CLEAR   Specific Gravity, Urine >1.046 (H) 1.005 - 1.030   pH 6.0 5.0 - 8.0   Glucose, UA NEGATIVE NEGATIVE mg/dL   Hgb urine dipstick NEGATIVE NEGATIVE   Bilirubin Urine NEGATIVE NEGATIVE   Ketones, ur NEGATIVE NEGATIVE mg/dL   Protein, ur 30 (A) NEGATIVE mg/dL   Nitrite NEGATIVE NEGATIVE   Leukocytes,Ua NEGATIVE NEGATIVE   RBC / HPF 0-5 0 - 5 RBC/hpf   WBC, UA 0-5 0 - 5 WBC/hpf   Bacteria, UA NONE SEEN NONE SEEN   Mucus PRESENT     Comment: Performed at [facility], [address] [place], [place] [address]  Protime-INR     Status: None   Collection Time: 12/07/20  4:46 AM  Result Value Ref Range  Prothrombin Time 12.3 11.4 - 15.2 seconds   INR 0.9 0.8 - 1.2    Comment: (NOTE) INR goal varies based on device and disease  states. Performed at [facility], [address] [place], [place] [address]   CBC     Status: Abnormal   Collection Time: 12/07/20  5:19 AM  Result Value Ref Range   WBC 5.7 4.0 - 10.5 K/uL   RBC 2.90 (L) 4.22 - 5.81 MIL/uL   Hemoglobin 7.6 (L) 13.0 - 17.0 g/dL   HCT 32.4 (L) 40.1 - 02.7 %   MCV 82.4 80.0 - 100.0 fL   MCH 26.2 26.0 - 34.0 pg   MCHC 31.8 30.0 - 36.0 g/dL   RDW 25.3 (H) 66.4 - 40.3 %   Platelets 251 150 - 400 K/uL   nRBC 0.0 0.0 - 0.2 %    Comment: Performed at [facility], [address], [place], [place] [address]  Magnesium     Status: Abnormal   Collection Time: 12/07/20  6:53 AM  Result Value Ref Range   Magnesium 1.4 (L) 1.7 - 2.4 mg/dL    Comment: Performed at [facility] Lab, [address], [place], [place] [address]  Phosphorus     Status: None   Collection Time: 12/07/20  6:53 AM  Result Value Ref Range   Phosphorus 3.5 2.5 - 4.6 mg/dL    Comment: Performed at [facility], [address], [place], [place] [address]  Basic metabolic panel     Status: Abnormal   Collection Time: 12/07/20  6:53 AM  Result Value Ref Range   Sodium 132 (L) 135 - 145 mmol/L   Potassium 2.9 (L) 3.5 - 5.1 mmol/L   Chloride 95 (L) 98 - 111 mmol/L   CO2 25 22 - 32 mmol/L   Glucose, Bld 93 70 - 99 mg/dL    Comment: Glucose reference range applies only to samples taken after fasting for at least 8 hours.   BUN 7 6 - 20 mg/dL   Creatinine, Ser 3.29 (L) 0.61 - 1.24 mg/dL   Calcium 8.9 8.9 - 51.8 mg/dL   GFR, Estimated >84 >16 mL/min    Comment: (NOTE) Calculated using the CKD-EPI Creatinine Equation (2021)    Anion gap 12 5 - 15    Comment: Performed at [facility], [address], [place], [place] [address]  CBG monitoring, ED     Status: Abnormal   Collection Time: 12/07/20  7:38 AM  Result Value Ref Range   Glucose-Capillary 102 (H) 70 - 99 mg/dL    Comment: Glucose reference range applies only to samples taken after fasting for at least  8 hours.   Comment 1 Notify RN    Comment 2 Document in Chart    CT ABDOMEN PELVIS W CONTRAST  Result Date: 12/07/2020 CLINICAL DATA:  Hematemesis, bright red blood per rectum, abdominal pain. EXAM: CT ABDOMEN AND PELVIS WITH CONTRAST TECHNIQUE: Multidetector CT imaging of the abdomen and pelvis was performed using the standard protocol following bolus administration of intravenous contrast. CONTRAST:  OMNIPAQUE IOHEXOL 350 MG/ML SOLN COMPARISON:  09/16/2020 FINDINGS: Lower chest: The visualized lung bases are clear. The visualized heart and pericardium are unremarkable. Hepatobiliary: Severe hepatic steatosis. Mild hepatomegaly. No focal intrahepatic mass identified. No intra or extrahepatic biliary ductal dilation. Gallbladder unremarkable. Pancreas: There is mild peripancreatic inflammatory fluid seen within the pancreatico duodenal groove in keeping with acute groove pancreatitis. The pancreatic  duct is not dilated. No peripancreatic inflammatory changes surrounding the body and tail the pancreas. Several ill-defined low-attenuation regions within the head of the pancreas may relate to interstitial edema, or scattered foci of parenchymal necrosis but are too small to accurately characterize. No loculated peripancreatic fluid collections are identified there is no evidence of peripancreatic necrosis. Spleen: Unremarkable Adrenals/Urinary Tract: Adrenal glands are unremarkable. Kidneys are normal, without renal calculi, focal lesion, or hydronephrosis. Bladder is unremarkable. Stomach/Bowel: Stomach is within normal limits. Appendix appears normal. No evidence of bowel wall thickening, distention, or inflammatory changes. No free air. Vascular/Lymphatic: Moderate aortoiliac atherosclerotic calcification. No aortic aneurysm. No pathologic adenopathy within the abdomen and pelvis. Reproductive: Mild prostatic hypertrophy with central indentation of the base of the bladder. Other: No abdominal wall  hernia. Musculoskeletal: Degenerative changes are seen within the lumbar spine. No acute bone abnormality. IMPRESSION: Groove pancreatitis. No loculated peripancreatic fluid collections. Small foci of hypoattenuation within the pancreatic head may represent areas of interstitial edema or tiny foci of parenchymal necrosis but are not well characterized at this time. This can be reassessed on subsequent imaging. Severe hepatic steatosis and mild hepatomegaly, unchanged. Aortic Atherosclerosis (ICD10-I70.0). Electronically Signed   By: Helyn Numbers M.D.   On: 12/07/2020 01:28    Pending Labs Unresulted Labs (From admission, onward)     Start     Ordered   12/06/20 2111  Blood culture (routine x 2)  BLOOD CULTURE X 2,   STAT      12/06/20 2111            Vitals/Pain Today's Vitals   12/07/20 0900 12/07/20 1015 12/07/20 1030 12/07/20 1100  BP:  (!) 164/97  (!) 144/63  Pulse: 81 83  76  Resp: 16 17 19 16   Temp:      TempSrc:      SpO2: 91% 94% 100% 98%  Weight:      Height:      PainSc:        Isolation Precautions No active isolations  Medications Medications  enoxaparin (LOVENOX) injection 40 mg (has no administration in time range)  lactated ringers infusion ( Intravenous New Bag/Given 12/07/20 0648)  HYDROmorphone (DILAUDID) injection 0.5 mg (has no administration in time range)  ondansetron (ZOFRAN) injection 4 mg (has no administration in time range)  iohexol (OMNIPAQUE) 350 MG/ML injection 100 mL (100 mLs Intravenous Contrast Given 12/07/20 0118)  potassium chloride 10 mEq in 100 mL IVPB (0 mEq Intravenous Stopped 12/07/20 1122)    Mobility walks Low fall risk   Focused Assessments    R Recommendations: See Admitting Provider Note  Report given to:   Additional Notes:

## 2020-12-07 NOTE — Telephone Encounter (Signed)
Transition Care Management Follow-up  Date of discharge and from where: 12/06/2020, [facility] - left AMA. Returned to hospital last night and currently remains hospitalized.

## 2020-12-07 NOTE — ED Notes (Signed)
Patient transported to CT 

## 2020-12-08 DIAGNOSIS — K7010 Alcoholic hepatitis without ascites: Secondary | ICD-10-CM

## 2020-12-08 LAB — BASIC METABOLIC PANEL
Anion gap: 8 (ref 5–15)
BUN: <5 mg/dL — ABNORMAL LOW (ref 6–20)
CO2: 28 mmol/L (ref 22–32)
Calcium: 9 mg/dL (ref 8.9–10.3)
Chloride: 98 mmol/L (ref 98–111)
Creatinine, Ser: 0.53 mg/dL — ABNORMAL LOW (ref 0.61–1.24)
GFR, Estimated: >60 mL/min (ref >60)
Glucose, Bld: 98 mg/dL (ref 70–99)
Potassium: 3 mmol/L — ABNORMAL LOW (ref 3.5–5.1)
Sodium: 134 mmol/L — ABNORMAL LOW (ref 135–145)

## 2020-12-08 LAB — CBC
HCT: 25.5 % — ABNORMAL LOW (ref 39.0–52.0)
Hemoglobin: 8.1 g/dL — ABNORMAL LOW (ref 13.0–17.0)
MCH: 26 pg (ref 26.0–34.0)
MCHC: 31.8 g/dL (ref 30.0–36.0)
MCV: 82 fL (ref 80.0–100.0)
Platelets: 303 10*3/uL (ref 150–400)
RBC: 3.11 MIL/uL — ABNORMAL LOW (ref 4.22–5.81)
RDW: 20 % — ABNORMAL HIGH (ref 11.5–15.5)
WBC: 4.6 10*3/uL (ref 4.0–10.5)
nRBC: 0 % (ref 0.0–0.2)

## 2020-12-08 LAB — GLUCOSE, CAPILLARY
Glucose-Capillary: 101 mg/dL — ABNORMAL HIGH (ref 70–99)
Glucose-Capillary: 111 mg/dL — ABNORMAL HIGH (ref 70–99)
Glucose-Capillary: 115 mg/dL — ABNORMAL HIGH (ref 70–99)
Glucose-Capillary: 121 mg/dL — ABNORMAL HIGH (ref 70–99)

## 2020-12-08 MED ORDER — POTASSIUM CHLORIDE 20 MEQ PO PACK
40.0000 meq | PACK | Freq: Two times a day (BID) | ORAL | Status: AC
  Administered 2020-12-08 (×2): 40 meq via ORAL
  Filled 2020-12-08 (×2): qty 2

## 2020-12-08 MED ORDER — PANCRELIPASE (LIP-PROT-AMYL) 36000-114000 UNITS PO CPEP
36000.0000 [IU] | ORAL_CAPSULE | Freq: Three times a day (TID) | ORAL | Status: DC
  Administered 2020-12-08 – 2020-12-12 (×10): 36000 [IU] via ORAL
  Filled 2020-12-08 (×10): qty 1

## 2020-12-08 NOTE — Plan of Care (Signed)
  Problem: Health Behavior/Discharge Planning: Goal: Ability to manage health-related needs will improve Outcome: Progressing   Problem: Clinical Measurements: Goal: Ability to maintain clinical measurements within normal limits will improve Outcome: Progressing Goal: Respiratory complications will improve Outcome: Progressing   Problem: Coping: Goal: Level of anxiety will decrease Outcome: Progressing

## 2020-12-08 NOTE — Progress Notes (Signed)
At 1000 paged MD to [phone]   At 1015: paged [phone]   o notify them pt had another nose bleed episode this morning.... was wanting to know if they would like for me to hold Lovenox SQ.... will wait for call back and further instructions.   Call back received from MD and was told to hold off lovenox SQ and she will be in for rounds shortly

## 2020-12-08 NOTE — Hospital Course (Signed)
Acute on chronic pancreatitis Alcohol use disorder Epistaxis Hematemesis Melena Iron deficiency anemia

## 2020-12-08 NOTE — TOC Initial Note (Signed)
Transition of Care [facility]) - Initial/Assessment Note    Patient Details  Name: Samuel Blevins MRN: [id number] Date of Birth: March 09, 1965  [facility] CM/SW Contact:    [name], Hershal Coria, RN Phone Number: 12/08/2020, 5:30 PM  Clinical Narrative: CM spoke with patient at bedside. Presented with nausea and abdominal pain, acute pancreatitis and epistasis. Recently admitted and left AMA. States he is homeless. Has no children and parents are deceased. Has siblings who help him at times. Does not have a PCP and no Medical Insurance. Patient with alcohol withdrawal at this time. On CIWA protocol. Patient requested to call [facility]) and ask if they will accept him after discharge for rehab. CM called [facility] and spoke with Aleta [phone]). Aleta states to send a referral from MD and any labs and assessments and they will evaluate and give an answer by Monday.They also want patient to call the office tomorrow for an evaluation. MD and patient notified. CM faxed out the H&P and current progress note to [phone]). Will follow up on Monday. Medical work up continues.    Expected Discharge Plan:  (Homeless) Barriers to Discharge: Continued Medical Work up, Homeless with medical needs   Patient Goals and CMS Choice Patient states their goals for this hospitalization and ongoing recovery are:: To go to [facility] for recovery CMS Medicare.gov Compare Post Acute Care list provided to:: Patient Choice offered to / list presented to : Patient  Expected Discharge Plan and Services Expected Discharge Plan:  (Homeless)   Discharge Planning Services: CM Consult Post Acute Care Choice: NA Living arrangements for the past 2 months: Homeless                                      Prior Living Arrangements/Services Living arrangements for the past 2 months: Homeless Lives with:: Self Patient language and need for interpreter reviewed::  Yes Do you feel safe going back to the place where you live?: Yes      Need for Family Participation in Patient Care: Yes (Comment) Care giver support system in place?: Yes (comment)   Criminal Activity/Legal Involvement Pertinent to Current Situation/Hospitalization: No - Comment as needed  Activities of Daily Living Home Assistive Devices/Equipment: None ADL Screening (condition at time of admission) Patient's cognitive ability adequate to safely complete daily activities?: Yes Is the patient deaf or have difficulty hearing?: No Does the patient have difficulty seeing, even when wearing glasses/contacts?: No Does the patient have difficulty concentrating, remembering, or making decisions?: No Patient able to express need for assistance with ADLs?: Yes Does the patient have difficulty dressing or bathing?: No Independently performs ADLs?: Yes (appropriate for developmental age) Does the patient have difficulty walking or climbing stairs?: No Weakness of Legs: None Weakness of Arms/Hands: None  Permission Sought/Granted Permission sought to share information with : Case Manager, Magazine features editor, Family Supports Permission granted to share information with : Yes, Verbal Permission Granted              Emotional Assessment Appearance:: Appears stated age Attitude/Demeanor/Rapport: Engaged Affect (typically observed): Accepting, Hopeful, Appropriate Orientation: : Oriented to Self, Oriented to Place, Oriented to  Time, Oriented to Situation Alcohol / Substance Use: Alcohol Use Psych Involvement: No (comment)  Admission diagnosis:  Pancreatitis [K85.90] Other iron deficiency anemia [D50.8] Alcohol-induced acute pancreatitis, unspecified complication status [K85.20] Patient Active Problem List   Diagnosis Date Noted  Pancreatitis 12/07/2020   Alcoholic steatohepatitis 12/07/2020   Iron deficiency anemia due to chronic blood loss 12/07/2020   Seizure (HCC)  12/04/2020   Gastrointestinal hemorrhage    Acute pancreatitis 09/16/2020   Alcohol withdrawal (HCC) 09/16/2020   Chronic pain syndrome 12/28/2019   Pancreatic pseudocyst 10/28/2019   Pancreatitis, acute 10/28/2019   Gastritis and gastroduodenitis    AVM (arteriovenous malformation) of small bowel, acquired    ETOH abuse 06/21/2019   Tobacco abuse 03/17/2014   Essential hypertension 03/17/2014   PCP:  [name], MD Pharmacy:   [facility] [address], [place] - [address] [place] [address] Phone: [phone] Fax: [phone]  [facility] [address] [place] [address] Phone: [phone] Fax: [phone]  [facility] [address] [place] [address] Phone: [phone] Fax: [phone]     Social Determinants of Health (SDOH) Interventions    Readmission Risk Interventions Readmission Risk Prevention Plan 06/22/2019  Post Dischage Appt Complete  Medication Screening Complete  Transportation Screening Complete  Some recent data might be hidden

## 2020-12-08 NOTE — Progress Notes (Addendum)
HD#1 SUBJECTIVE:  Patient Summary: Samuel Blevins is a 55 y.o. with a pertinent PMH of alcohol use disorder with withdrawal seizure, iron deficiency anemia, hepatic steatosis, homelessness, who presented with nausea and abdominal pain after recently leaving AMA from hospitalization for alcohol withdrawal seizure and admitted for acute pancreatitis and epistasis on hospital day 1.   Overnight Events:  Patient received 2 mg of Ativan due to CIWA of 13.  7 of 13 points were attributed to sweatiness.  Interim History:  Patient evaluated at bedside this AM.  He was comfortably laying in bed.  He reported eating eggs and a muffin for breakfast without nausea.  He has had a bowel movement this morning that was loose, but no black or red coloration in his stool.  He did have an episode of epistasis this morning that lasted about 2 minutes and was less then the bleeds he has had before.  He reports that he has needed 3 nosebleeds a week.   OBJECTIVE:  Vital Signs: Vitals:   12/07/20 1807 12/07/20 2121 12/07/20 2331 12/08/20 0500  BP: 139/72 (!) 160/84 (!) 151/91   Pulse: 89 77 82   Resp: 20 18 18    Temp: 98.8 F (37.1 C) 99.1 F (37.3 C) 98.1 F (36.7 C)   TempSrc: Oral Oral Oral   SpO2: 98% 98% 99%   Weight:    76.6 kg  Height:       Supplemental O2: Room Air SpO2: 99 %  Filed Weights   12/06/20 2119 12/08/20 0500  Weight: 79.4 kg 76.6 kg     Intake/Output Summary (Last 24 hours) at 12/08/2020 12/10/2020 Last data filed at 12/08/2020 0556 Gross per 24 hour  Intake 2275.75 ml  Output 1100 ml  Net 1175.75 ml    Net IO Since Admission: 1,175.75 mL [12/08/20 0626]  Physical Exam: Physical Exam Constitutional:      General: He is not in acute distress.    Appearance: He is not ill-appearing.  HENT:     Head: Normocephalic and atraumatic.     Mouth/Throat:     Mouth: Mucous membranes are moist.  Cardiovascular:     Rate and Rhythm: Normal rate and regular rhythm.     Heart  sounds: Normal heart sounds.  Pulmonary:     Effort: Pulmonary effort is normal.     Breath sounds: Normal breath sounds.  Abdominal:     General: There is distension.     Tenderness: There is abdominal tenderness.     Comments: Bowel sounds appreciated in all 4 quadrants, distension improved from yesterday, hepatomegaly  Musculoskeletal:        General: Normal range of motion.     Comments: Nodules palpated at joint of manubrium and rib on level 2-3, symmetric.  No associated tenderness  Skin:    General: Skin is warm and dry.  Neurological:     General: No focal deficit present.     Mental Status: He is alert and oriented to person, place, and time.  Psychiatric:        Mood and Affect: Mood normal.        Behavior: Behavior normal.    Patient Lines/Drains/Airways Status     Active Line/Drains/Airways     Name Placement date Placement time Site Days   Peripheral IV 12/07/20 20 G 1" Anterior;Left Forearm 12/07/20  0037  Forearm  less than 1            Pertinent Labs: CBC Latest Ref Rng &  Units 12/08/2020 12/07/2020 12/06/2020  WBC 4.0 - 10.5 K/uL 4.6 5.7 6.4  Hemoglobin 13.0 - 17.0 g/dL 8.1(L) 7.6(L) 7.9(L)  Hematocrit 39.0 - 52.0 % 25.5(L) 23.9(L) 25.8(L)  Platelets 150 - 400 K/uL 303 251 247    CMP Latest Ref Rng & Units 12/08/2020 12/07/2020 12/06/2020  Glucose 70 - 99 mg/dL 98 93 170(Y)  BUN 6 - 20 mg/dL <1(V) 7 8  Creatinine 4.94 - 1.24 mg/dL 4.96(P) 5.91(M) 3.84  Sodium 135 - 145 mmol/L 134(L) 132(L) 134(L)  Potassium 3.5 - 5.1 mmol/L 3.0(L) 2.9(L) 3.1(L)  Chloride 98 - 111 mmol/L 98 95(L) 95(L)  CO2 22 - 32 mmol/L 28 25 28   Calcium 8.9 - 10.3 mg/dL 9.0 8.9 9.8  Total Protein 6.5 - 8.1 g/dL - - 7.9  Total Bilirubin 0.3 - 1.2 mg/dL - - 0.7  Alkaline Phos 38 - 126 U/L - - 97  AST 15 - 41 U/L - - 127(H)  ALT 0 - 44 U/L - - 38    Recent Labs    12/07/20 0738  GLUCAP 102*      Pertinent Imaging: No results found.  ASSESSMENT/PLAN:   Assessment: Active Problems:   Pancreatitis   Alcoholic steatohepatitis   Iron deficiency anemia due to chronic blood loss  Samuel Blevins is a 55 y.o. with a pertinent PMH of alcohol use disorder with withdrawal seizure, iron deficiency anemia, hepatic steatosis, homelessness, who presented with nausea and abdominal pain after recently leaving AMA from hospitalization for alcohol withdrawal seizure and admitted for acute pancreatitis and epistasis on hospital day 1.   Plan: #Acute on chronic pancreatitis Patient able to tolerate diet with soft, low-fat foods.  He has had a bowel movement this morning that was loose, without abdominal pain.  Has remained afebrile.  He states that his pain is improved from yesterday and he has not required any as needed Dilaudid.  We will add Creon to help with breakdown of fats, proteins and starch.  -Discontinued IV LR 100 cc/h -Dilaudid 0.5 mg every 6 hours as needed for pain -Zofran for nausea -Tolerating soft diet with instructions for low-fat  #Epistaxis#Hematemesis#Bloody stool#iron deficiency anemia Patient had nosebleed this morning that lasted about 2 minutes.  He states that he usually has 2-3 every week and it is usually a large amount of blood.  Hemoglobin stable this morning from 7.6->8.1.  Platelets stable at 303.  At recent hospitalization patient was found to have severe iron deficiency anemia.  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  Patient reports episode of blood in his stool previously and that he received a colonoscopy 6 months ago.  Was unable to see documentation of colonoscopy in epic.  Patient would benefit from further GI follow-up outpatient  -Hemoglobin stable from 7.6-8.1 this a.m.  Patient will need outpatient GI follow-up -Needs outpatient colonoscopy  #Alcohol use disorder Patient reports feeling anxious and clammy.  States that his last drink was 10/12.  CIWA of 13 overnight and was given 2 mg of Ativan.  -CIWA  protocol with Ativan  Best Practice: Diet: Soft diet IVF: Fluids: LR, Rate: 100 mL/hr VTE: enoxaparin (LOVENOX) injection 40 mg Start: 12/07/20 0800 Code: Full AB: none Therapy Recs: Pending Family Contact: none DISPO: Anticipated discharge passed on medical stability.  Signature: 12/09/20, D.O. Internal Medicine Resident, PGY-1 [facility]  Pager: [phone] 6:26 AM, 12/08/2020   Please contact the on call pager after 5 pm and on weekends at [phone].

## 2020-12-08 NOTE — Progress Notes (Signed)
Pt had a CIWA score of 13 and was noted to be perspiring profusely. Pt was given 2mg  of ativan which was effective CIWA when rechecked was at 3. Will continue to monitor.

## 2020-12-09 LAB — CULTURE, BLOOD (ROUTINE X 2)
Culture: NO GROWTH
Culture: NO GROWTH
Special Requests: ADEQUATE

## 2020-12-09 LAB — CBC
HCT: 25.2 % — ABNORMAL LOW (ref 39.0–52.0)
Hemoglobin: 7.7 g/dL — ABNORMAL LOW (ref 13.0–17.0)
MCH: 25.2 pg — ABNORMAL LOW (ref 26.0–34.0)
MCHC: 30.6 g/dL (ref 30.0–36.0)
MCV: 82.6 fL (ref 80.0–100.0)
Platelets: 390 10*3/uL (ref 150–400)
RBC: 3.05 MIL/uL — ABNORMAL LOW (ref 4.22–5.81)
RDW: 20 % — ABNORMAL HIGH (ref 11.5–15.5)
WBC: 5.7 10*3/uL (ref 4.0–10.5)
nRBC: 0 % (ref 0.0–0.2)

## 2020-12-09 LAB — BASIC METABOLIC PANEL
Anion gap: 8 (ref 5–15)
BUN: <5 mg/dL — ABNORMAL LOW (ref 6–20)
CO2: 25 mmol/L (ref 22–32)
Calcium: 9 mg/dL (ref 8.9–10.3)
Chloride: 100 mmol/L (ref 98–111)
Creatinine, Ser: 0.56 mg/dL — ABNORMAL LOW (ref 0.61–1.24)
GFR, Estimated: >60 mL/min (ref >60)
Glucose, Bld: 100 mg/dL — ABNORMAL HIGH (ref 70–99)
Potassium: 3.6 mmol/L (ref 3.5–5.1)
Sodium: 133 mmol/L — ABNORMAL LOW (ref 135–145)

## 2020-12-09 LAB — GLUCOSE, CAPILLARY: Glucose-Capillary: 137 mg/dL — ABNORMAL HIGH (ref 70–99)

## 2020-12-09 MED ORDER — ACETAMINOPHEN 325 MG PO TABS: 650.0000 mg | ORAL_TABLET | Freq: Four times a day (QID) | ORAL | Status: DC | PRN

## 2020-12-09 MED ORDER — MAGNESIUM SULFATE 2 GM/50ML IV SOLN
2.0000 g | Freq: Once | INTRAVENOUS | Status: AC
  Administered 2020-12-09: 2 g via INTRAVENOUS
  Filled 2020-12-09: qty 50

## 2020-12-09 NOTE — Progress Notes (Signed)
HD#2 SUBJECTIVE:  Patient Summary: Samuel Blevins is a 55 y.o. with a pertinent PMH of alcohol use disorder with withdrawal seizure, iron deficiency anemia, hepatic steatosis, homelessness, who presented with nausea and abdominal pain after recently leaving AMA from hospitalization for alcohol withdrawal seizure and admitted for acute pancreatitis on hospital day 2.   Overnight Events:  No overnight events Interim History:  Patient evaluated at bedside this AM.  He states that he feels better today.  He reports that he was able to eat breakfast without any nausea.  He had a bowel movement yesterday but has not had one today thus far.  Pain in his abdomen is improved from previous day. OBJECTIVE:  Vital Signs: Vitals:   12/08/20 1025 12/08/20 1722 12/08/20 2100 12/09/20 0352  BP: 129/87 (!) 152/83 (!) 153/83 (!) 144/87  Pulse: 87 84 81 83  Resp: 17 18    Temp: 98.6 F (37 C) 98.2 F (36.8 C) 99 F (37.2 C) 97.9 F (36.6 C)  TempSrc: Oral Oral Oral Oral  SpO2: 96% 98% 98% 98%  Weight:      Height:       Supplemental O2: Room Air SpO2: 98 %  Filed Weights   12/06/20 2119 12/08/20 0500  Weight: 79.4 kg 76.6 kg     Intake/Output Summary (Last 24 hours) at 12/09/2020 6378 Last data filed at 12/09/2020 0300 Gross per 24 hour  Intake 1160 ml  Output 1660 ml  Net -500 ml    Net IO Since Admission: 675.75 mL [12/09/20 0648]  Physical Exam: Physical Exam Constitutional:      General: He is not in acute distress.    Appearance: He is not ill-appearing.  HENT:     Head: Normocephalic and atraumatic.     Mouth/Throat:     Mouth: Mucous membranes are moist.  Cardiovascular:     Rate and Rhythm: Normal rate and regular rhythm.     Heart sounds: Normal heart sounds.  Pulmonary:     Effort: Pulmonary effort is normal.     Breath sounds: Normal breath sounds.  Abdominal:     General: There is no distension.     Tenderness: There is abdominal tenderness.     Comments:  Resonance percussed diffusely, bowel sounds decreased in left lower quadrant  Musculoskeletal:        General: Normal range of motion.     Comments: Nodules palpated at joint of manubrium and rib on level 2-3, symmetric.  No associated tenderness  Skin:    General: Skin is warm and dry.  Neurological:     General: No focal deficit present.     Mental Status: He is alert and oriented to person, place, and time.  Psychiatric:        Mood and Affect: Mood normal.        Behavior: Behavior normal.    Patient Lines/Drains/Airways Status     Active Line/Drains/Airways     Name Placement date Placement time Site Days   Peripheral IV 12/07/20 20 G 1" Anterior;Left Forearm 12/07/20  0037  Forearm  less than 1            Pertinent Labs: CBC Latest Ref Rng & Units 12/09/2020 12/08/2020 12/07/2020  WBC 4.0 - 10.5 K/uL 5.7 4.6 5.7  Hemoglobin 13.0 - 17.0 g/dL 7.7(L) 8.1(L) 7.6(L)  Hematocrit 39.0 - 52.0 % 25.2(L) 25.5(L) 23.9(L)  Platelets 150 - 400 K/uL 390 303 251    CMP Latest Ref Rng & Units  12/09/2020 12/08/2020 12/07/2020  Glucose 70 - 99 mg/dL 448(J) 98 93  BUN 6 - 20 mg/dL <8(H) <6(D) 7  Creatinine 0.61 - 1.24 mg/dL 1.49(F) 0.26(V) 7.85(Y)  Sodium 135 - 145 mmol/L 133(L) 134(L) 132(L)  Potassium 3.5 - 5.1 mmol/L 3.6 3.0(L) 2.9(L)  Chloride 98 - 111 mmol/L 100 98 95(L)  CO2 22 - 32 mmol/L 25 28 25   Calcium 8.9 - 10.3 mg/dL 9.0 9.0 8.9  Total Protein 6.5 - 8.1 g/dL - - -  Total Bilirubin 0.3 - 1.2 mg/dL - - -  Alkaline Phos 38 - 126 U/L - - -  AST 15 - 41 U/L - - -  ALT 0 - 44 U/L - - -    Recent Labs    12/08/20 1132 12/08/20 1659 12/08/20 2307  GLUCAP 111* 115* 121*      Pertinent Imaging: No results found.  ASSESSMENT/PLAN:  Assessment: Active Problems:   Pancreatitis   Alcoholic steatohepatitis   Iron deficiency anemia due to chronic blood loss  Samuel Blevins is a 55 y.o. with a pertinent PMH of alcohol use disorder with withdrawal seizure, iron  deficiency anemia, hepatic steatosis, homelessness, who presented with nausea and abdominal pain after recently leaving AMA from hospitalization for alcohol withdrawal seizure and admitted for acute pancreatitis and epistasis on hospital day 2.   Plan: #Acute on chronic pancreatitis-improving secondary to alcohol use Patient states that his pain is well controlled has not received any pain medications.  He is tolerated food and he had a bowel movement yesterday.  His acute on chronic pancreatitis is improving.  Patient is interested in going to an alcohol rehabilitation center [facility]) following discharge.  -Dilaudid 0.5 mg every 6 hours as needed for pain -Zofran for nausea -Tolerating diet  #Hematemesis#Bloody stool#iron deficiency anemia#epistaxis Patient states that he has had no further nosebleeds today he did not notice any blood in his diarrhea yesterday.  No bowel movements today thus far.  His hemoglobin decreased from 8.1-7.7 overnight.  If Hgb continues to decrease we will consult GI. 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  Patient reports previous episodes of blood in his stool.  Patient reports receiving colonoscopy 6 months ago, however this was not seen in epic.  Patient also had nosebleed yesterday morning.  He states that he has 2-3 nosebleeds a week.  He reports that usually it is a large amount of blood.  Differentials include upper GI bleed vs internal hemorrhoids vs. colon source of bleeding. If Hemoglobin continues to decrease will consider consulting GI.   -Hbg decreased from 8.1 to 7.7 today, Consult GI if continues to trend down -repeat CBC  #Alcohol use disorder Patient has not required any dosages of Ativan in the last 24 hours  -CIWA protocol with Ativan  Best Practice: Diet: Soft diet IVF: Fluids: LR, Rate: 100 mL/hr VTE: enoxaparin (LOVENOX) injection 40 mg Start: 12/07/20 0800 Code: Full AB: none Therapy Recs: Pending Family Contact:  none DISPO: Anticipated discharge passed on medical stability.  Signature: 12/09/20, D.O. Internal Medicine Resident, PGY-1 [facility]  Pager: [phone] 6:48 AM, 12/09/2020   Please contact the on call pager after 5 pm and on weekends at [phone].

## 2020-12-09 NOTE — Plan of Care (Signed)
  Problem: Education: Goal: Knowledge of General Education information will improve Description: Including pain rating scale, medication(s)/side effects and non-pharmacologic comfort measures Outcome: Progressing   Problem: Health Behavior/Discharge Planning: Goal: Ability to manage health-related needs will improve Outcome: Progressing   Problem: Clinical Measurements: Goal: Ability to maintain clinical measurements within normal limits will improve Outcome: Progressing   Problem: Clinical Measurements: Goal: Will remain free from infection Outcome: Progressing   Problem: Clinical Measurements: Goal: Cardiovascular complication will be avoided Outcome: Progressing   Problem: Activity: Goal: Risk for activity intolerance will decrease Outcome: Progressing   Problem: Coping: Goal: Level of anxiety will decrease Outcome: Progressing

## 2020-12-10 DIAGNOSIS — K859 Acute pancreatitis without necrosis or infection, unspecified: Secondary | ICD-10-CM

## 2020-12-10 DIAGNOSIS — D500 Iron deficiency anemia secondary to blood loss (chronic): Secondary | ICD-10-CM

## 2020-12-10 LAB — CBC WITH DIFFERENTIAL/PLATELET
Abs Immature Granulocytes: 0 10*3/uL (ref 0.00–0.07)
Basophils Absolute: 0.3 10*3/uL — ABNORMAL HIGH (ref 0.0–0.1)
Basophils Relative: 4 %
Eosinophils Absolute: 0.1 10*3/uL (ref 0.0–0.5)
Eosinophils Relative: 1 %
HCT: 25 % — ABNORMAL LOW (ref 39.0–52.0)
Hemoglobin: 7.6 g/dL — ABNORMAL LOW (ref 13.0–17.0)
Lymphocytes Relative: 10 %
Lymphs Abs: 0.6 10*3/uL — ABNORMAL LOW (ref 0.7–4.0)
MCH: 25.6 pg — ABNORMAL LOW (ref 26.0–34.0)
MCHC: 30.4 g/dL (ref 30.0–36.0)
MCV: 84.2 fL (ref 80.0–100.0)
Monocytes Absolute: 1.1 10*3/uL — ABNORMAL HIGH (ref 0.1–1.0)
Monocytes Relative: 17 %
Neutro Abs: 4.4 10*3/uL (ref 1.7–7.7)
Neutrophils Relative %: 68 %
Platelets: 540 10*3/uL — ABNORMAL HIGH (ref 150–400)
RBC: 2.97 MIL/uL — ABNORMAL LOW (ref 4.22–5.81)
RDW: 20.2 % — ABNORMAL HIGH (ref 11.5–15.5)
WBC: 6.4 10*3/uL (ref 4.0–10.5)
nRBC: 0 % (ref 0.0–0.2)
nRBC: 0 /100 WBC

## 2020-12-10 LAB — COMPREHENSIVE METABOLIC PANEL
ALT: 30 U/L (ref 0–44)
AST: 67 U/L — ABNORMAL HIGH (ref 15–41)
Albumin: 2.9 g/dL — ABNORMAL LOW (ref 3.5–5.0)
Alkaline Phosphatase: 80 U/L (ref 38–126)
Anion gap: 8 (ref 5–15)
BUN: 5 mg/dL — ABNORMAL LOW (ref 6–20)
CO2: 25 mmol/L (ref 22–32)
Calcium: 9.1 mg/dL (ref 8.9–10.3)
Chloride: 102 mmol/L (ref 98–111)
Creatinine, Ser: 0.68 mg/dL (ref 0.61–1.24)
GFR, Estimated: >60 mL/min (ref >60)
Glucose, Bld: 102 mg/dL — ABNORMAL HIGH (ref 70–99)
Potassium: 4.1 mmol/L (ref 3.5–5.1)
Sodium: 135 mmol/L (ref 135–145)
Total Bilirubin: 0.9 mg/dL (ref 0.3–1.2)
Total Protein: 7.2 g/dL (ref 6.5–8.1)

## 2020-12-10 LAB — MAGNESIUM: Magnesium: 1.7 mg/dL (ref 1.7–2.4)

## 2020-12-10 LAB — HEMOGLOBIN AND HEMATOCRIT, BLOOD
HCT: 24.5 % — ABNORMAL LOW (ref 39.0–52.0)
Hemoglobin: 7.4 g/dL — ABNORMAL LOW (ref 13.0–17.0)

## 2020-12-10 LAB — GLUCOSE, CAPILLARY: Glucose-Capillary: 105 mg/dL — ABNORMAL HIGH (ref 70–99)

## 2020-12-10 NOTE — Progress Notes (Signed)
HD#3 Subjective:  Overnight Events: None  Patient sitting upright in bed eating comfortably in no acute distress.  He is not having worsening of abdominal pain with meals.  He is tolerating his p.o. diet well.  He does endorse an episode of nosebleeding earlier this morning.  Objective:  Vital signs in last 24 hours: Vitals:   12/09/20 1751 12/10/20 0526 12/10/20 0856 12/10/20 1616  BP: 140/78 (!) 143/84 121/73 (!) 160/91  Pulse: 88 75 85 81  Resp: 18 18 16 18   Temp: 98.6 F (37 C) 98.4 F (36.9 C) 98 F (36.7 C) 98.2 F (36.8 C)  TempSrc: Oral Oral Oral Oral  SpO2: 100% 100% 100% 99%  Weight:  76.6 kg    Height:       Supplemental O2: Room Air SpO2: 99 %  Physical Exam:  Constitutional: Well-appearing in no acute distress HENT: normocephalic atraumatic Eyes: conjunctiva non-erythematous Neck: supple Cardiovascular: regular rate and rhythm, no m/r/g Pulmonary/Chest: normal work of breathing on room air Abdominal: soft, nondistended, tenderness to palpation epigastric region MSK: normal bulk and tone Neurological: alert & oriented x 3, 5/5 strength in bilateral upper and lower extremities, normal gait Skin: warm and dry Psych: Normal mood and thought process  Filed Weights   12/06/20 2119 12/08/20 0500 12/10/20 0526  Weight: 79.4 kg 76.6 kg 76.6 kg     Intake/Output Summary (Last 24 hours) at 12/10/2020 1828 Last data filed at 12/10/2020 1616 Gross per 24 hour  Intake --  Output 650 ml  Net -650 ml   Net IO Since Admission: 25.75 mL [12/10/20 1828]  Pertinent Labs: CBC Latest Ref Rng & Units 12/10/2020 12/09/2020 12/08/2020  WBC 4.0 - 10.5 K/uL 6.4 5.7 4.6  Hemoglobin 13.0 - 17.0 g/dL 7.6(L) 7.7(L) 8.1(L)  Hematocrit 39.0 - 52.0 % 25.0(L) 25.2(L) 25.5(L)  Platelets 150 - 400 K/uL 540(H) 390 303    CMP Latest Ref Rng & Units 12/10/2020 12/09/2020 12/08/2020  Glucose 70 - 99 mg/dL 12/10/2020) 301(S) 98  BUN 6 - 20 mg/dL 5(L) 010(X) <3(A)  Creatinine 0.61 -  1.24 mg/dL <3(F 5.73) 2.20(U)  Sodium 135 - 145 mmol/L 135 133(L) 134(L)  Potassium 3.5 - 5.1 mmol/L 4.1 3.6 3.0(L)  Chloride 98 - 111 mmol/L 102 100 98  CO2 22 - 32 mmol/L 25 25 28   Calcium 8.9 - 10.3 mg/dL 9.1 9.0 9.0  Total Protein 6.5 - 8.1 g/dL 7.2 - -  Total Bilirubin 0.3 - 1.2 mg/dL 0.9 - -  Alkaline Phos 38 - 126 U/L 80 - -  AST 15 - 41 U/L 67(H) - -  ALT 0 - 44 U/L 30 - -    Imaging: No results found.  Assessment/Plan:   Active Problems:   Pancreatitis   Alcoholic steatohepatitis   Iron deficiency anemia due to chronic blood loss   Patient Summary: Samuel Blevins is a 55 y.o. with a pertinent PMH of alcohol use disorder with withdrawal seizure, iron deficiency anemia, hepatic steatosis, homelessness, who presented with nausea and abdominal pain after recently leaving AMA from hospitalization for alcohol withdrawal seizure and admitted for acute pancreatitis and epistasis on hospital day 3.   Acute on chronic pancreatitis-improving secondary to alcohol use Patient has been tolerating his diet well without any acute concerns. He has not needed pain medication and is tolerating his diet well.  Social work working with team for placement to acute alcohol rehab facility. -Tylenol for moderate pain -Zofran for nausea -Tolerating diet   Hematemesis Bloody  stool Iron deficiency anemia Epistaxis Patient had acute episode of nosebleed earlier today and 1 episode of vomiting with.  Hemoglobin was stable this morning at 7.6 from 7.7.  We will repeat this evening as well as tomorrow morning.  Denies blood in his stool or urine today.  Patient reports receiving colonoscopy 6 months ago, however this was not seen in epic.  Patient also had nosebleed yesterday morning.  He states that he has 2-3 nosebleeds a week.  He reports that usually it is a large amount of blood.  Differentials include upper GI bleed vs internal hemorrhoids vs. colon source of bleeding. If Hemoglobin continues  to decrease will consider consulting GI.  -Hemoglobin stable at 7.7, if continues to downtrend consider consulting GI -Evening H&H pending   Alcohol use disorder Patient has been without signs or symptoms of alcohol withdrawal.  He states his last drink was prior to his admission.  -Continue CIWA  Diet:  Soft IVF: None,None VTE: Enoxaparin Code: Full  Dispo: Anticipated discharge to rehab facility pending further medical management.    Thalia Bloodgood DO Internal Medicine Resident PGY-2 Pager [phone] Please contact the on call pager after 5 pm and on weekends at [phone].

## 2020-12-11 DIAGNOSIS — K920 Hematemesis: Secondary | ICD-10-CM

## 2020-12-11 DIAGNOSIS — F1020 Alcohol dependence, uncomplicated: Secondary | ICD-10-CM

## 2020-12-11 DIAGNOSIS — K921 Melena: Secondary | ICD-10-CM

## 2020-12-11 DIAGNOSIS — K8681 Exocrine pancreatic insufficiency: Secondary | ICD-10-CM

## 2020-12-11 DIAGNOSIS — K860 Alcohol-induced chronic pancreatitis: Secondary | ICD-10-CM

## 2020-12-11 DIAGNOSIS — R040 Epistaxis: Secondary | ICD-10-CM

## 2020-12-11 LAB — CBC WITH DIFFERENTIAL/PLATELET
Abs Immature Granulocytes: 0.14 10*3/uL — ABNORMAL HIGH (ref 0.00–0.07)
Abs Immature Granulocytes: 0.1 10*3/uL — ABNORMAL HIGH (ref 0.00–0.07)
Basophils Absolute: 0.1 10*3/uL (ref 0.0–0.1)
Basophils Absolute: 0.1 10*3/uL (ref 0.0–0.1)
Basophils Relative: 1 %
Basophils Relative: 2 %
Eosinophils Absolute: 0.1 10*3/uL (ref 0.0–0.5)
Eosinophils Absolute: 0.1 10*3/uL (ref 0.0–0.5)
Eosinophils Relative: 1 %
Eosinophils Relative: 1 %
HCT: 24.9 % — ABNORMAL LOW (ref 39.0–52.0)
HCT: 27.3 % — ABNORMAL LOW (ref 39.0–52.0)
Hemoglobin: 7.5 g/dL — ABNORMAL LOW (ref 13.0–17.0)
Hemoglobin: 8.2 g/dL — ABNORMAL LOW (ref 13.0–17.0)
Immature Granulocytes: 2 %
Immature Granulocytes: 2 %
Lymphocytes Relative: 22 %
Lymphocytes Relative: 20 %
Lymphs Abs: 1.4 10*3/uL (ref 0.7–4.0)
Lymphs Abs: 1.3 10*3/uL (ref 0.7–4.0)
MCH: 25.6 pg — ABNORMAL LOW (ref 26.0–34.0)
MCH: 25.2 pg — ABNORMAL LOW (ref 26.0–34.0)
MCHC: 30.1 g/dL (ref 30.0–36.0)
MCHC: 30 g/dL (ref 30.0–36.0)
MCV: 85 fL (ref 80.0–100.0)
MCV: 84 fL (ref 80.0–100.0)
Monocytes Absolute: 1 10*3/uL (ref 0.1–1.0)
Monocytes Absolute: 1 10*3/uL (ref 0.1–1.0)
Monocytes Relative: 16 %
Monocytes Relative: 16 %
Neutro Abs: 3.6 10*3/uL (ref 1.7–7.7)
Neutro Abs: 3.7 10*3/uL (ref 1.7–7.7)
Neutrophils Relative %: 58 %
Neutrophils Relative %: 59 %
Platelets: 602 10*3/uL — ABNORMAL HIGH (ref 150–400)
Platelets: 770 10*3/uL — ABNORMAL HIGH (ref 150–400)
RBC: 2.93 MIL/uL — ABNORMAL LOW (ref 4.22–5.81)
RBC: 3.25 MIL/uL — ABNORMAL LOW (ref 4.22–5.81)
RDW: 19.9 % — ABNORMAL HIGH (ref 11.5–15.5)
RDW: 20.1 % — ABNORMAL HIGH (ref 11.5–15.5)
WBC: 6.3 10*3/uL (ref 4.0–10.5)
WBC: 6.2 10*3/uL (ref 4.0–10.5)
nRBC: 0 % (ref 0.0–0.2)
nRBC: 0 % (ref 0.0–0.2)

## 2020-12-11 LAB — BASIC METABOLIC PANEL
Anion gap: 9 (ref 5–15)
BUN: 8 mg/dL (ref 6–20)
CO2: 23 mmol/L (ref 22–32)
Calcium: 9.3 mg/dL (ref 8.9–10.3)
Chloride: 101 mmol/L (ref 98–111)
Creatinine, Ser: 0.81 mg/dL (ref 0.61–1.24)
GFR, Estimated: >60 mL/min (ref >60)
Glucose, Bld: 99 mg/dL (ref 70–99)
Potassium: 3.8 mmol/L (ref 3.5–5.1)
Sodium: 133 mmol/L — ABNORMAL LOW (ref 135–145)

## 2020-12-11 LAB — CULTURE, BLOOD (ROUTINE X 2)
Culture: NO GROWTH
Special Requests: ADEQUATE

## 2020-12-11 LAB — GLUCOSE, CAPILLARY: Glucose-Capillary: 133 mg/dL — ABNORMAL HIGH (ref 70–99)

## 2020-12-11 LAB — TYPE AND SCREEN
ABO/RH(D): A POS
Antibody Screen: NEGATIVE

## 2020-12-11 MED ORDER — PANTOPRAZOLE SODIUM 40 MG PO TBEC
40.0000 mg | DELAYED_RELEASE_TABLET | Freq: Every day | ORAL | Status: DC
  Administered 2020-12-11 – 2020-12-12 (×2): 40 mg via ORAL
  Filled 2020-12-11 (×2): qty 1

## 2020-12-11 MED ORDER — SALINE SPRAY 0.65 % NA SOLN
2.0000 | NASAL | Status: DC
  Administered 2020-12-12: 2 via NASAL
  Filled 2020-12-11: qty 44

## 2020-12-11 MED ORDER — OXYMETAZOLINE HCL 0.05 % NA SOLN
3.0000 | NASAL | Status: DC | PRN
  Filled 2020-12-11: qty 30

## 2020-12-11 MED ORDER — AQUAPHOR EX OINT
1.0000 "application " | TOPICAL_OINTMENT | Freq: Two times a day (BID) | CUTANEOUS | Status: DC
  Administered 2020-12-12: 1 via TOPICAL
  Filled 2020-12-11: qty 50

## 2020-12-11 MED ORDER — OXYMETAZOLINE HCL 0.05 % NA SOLN
3.0000 | Freq: Two times a day (BID) | NASAL | Status: DC
  Administered 2020-12-11: 3 via NASAL
  Filled 2020-12-11: qty 30

## 2020-12-11 MED ORDER — SODIUM CHLORIDE 0.9 % IV SOLN
250.0000 mg | Freq: Every day | INTRAVENOUS | Status: AC
  Administered 2020-12-11 – 2020-12-12 (×2): 250 mg via INTRAVENOUS
  Filled 2020-12-11 (×2): qty 20

## 2020-12-11 NOTE — H&P (View-Only) (Signed)
[facility] Consult: 8:57 AM 12/11/2020  LOS: 4 days    Referring Provider: Dr Welton Flakes  Primary Care Physician: Shan Levans MD, outpt clinic.   Primary Gastroenterologist:  Dr Barron Alvine.   Multiple no-shows for follow-up with MDs.    Reason for Consultation:  hematemesis, bloody stool.     HPI: Samuel Blevins is a 56 y.o. male.  Homeless.  PMH recurrent ETOH pancreatitis/groove pancreatitis.  Recurrent abd pain.  Developing pseudocyst on CT 09/2019.  Alcoholism.  Fatty liver w/O cirrhosis.  PUD, H Pylori positive (Flagyl, doxycycline, bismuth Rx 02/2020 but is unclear that he ever filled the prescription).  Hypertension.  Hyponatremia.  Anemia, past po iron Rx.    2021 EGD: Normal esophagus.  H. pylori gastritis, ablation of 6, nonbleeding duodenal AVMs. Left AMA 08/2020 when GI saw briefly for recurrent abd pain, anemia, dark stools.    Reports 2 or 3 months of epistaxis, hematemesis almost daily but not nausea.  No weight loss.  Abdominal pain is chronic.  Uses up to 800 mg ibuprofen daily for abdominal and other pain. Was admitted to hospital 10/10 -1/12 to address seizure related to alcohol withdrawal.  Hgb 8-9, iron panel consistent with IDA.  Also had low potassium and magnesium.  Treated with Ativan/CIWA protocol but left AMA.  Presented to the ED with complaint of hematemesis, dark stools, ongoing abdominal pain. Hgb 8.6 ... 7.5 over the last 8 days.  MCV mid 80s.  Platelets 500s to 600s. INR </= to 1.  Hyponatremia, 133 (129 last week).  BUN/creat wnl.   Lipase 175 last week,  LFTs w elevated AST, 127 ... 67.       12/07/20 CTAP w contrast: Groove pancreatitis. No peripancreatic fluid collections. Small foci of hypoattenuation within the pancreatic head may represent areas of interstitial edema or  tiny foci of parenchymal necrosis but are not well characterized at this time. This can be reassessed on subsequent imaging.  Severe hepatic steatosis and mild hepatomegaly, unchanged.  Aortic Atherosclerosis.  Patient has been tolerating solid food for a couple of days now.  Stool still dark and abdominal pain persists.  Drinks 750 mL or more of vodka on a daily basis.    Past Medical History:  Diagnosis Date   Alcohol withdrawal syndrome with complication (HCC)    Alcoholism (HCC)    Elevated AST (SGOT)    Homeless    Hypertension    Symptomatic anemia 11/23/2015    Past Surgical History:  Procedure Laterality Date   BIOPSY  06/23/2019   Procedure: BIOPSY;  Surgeon: Shellia Cleverly, DO;  Location: [facility];  Service: Gastroenterology;;   ESOPHAGOGASTRODUODENOSCOPY (EGD) WITH PROPOFOL N/A 06/23/2019   Procedure: ESOPHAGOGASTRODUODENOSCOPY (EGD) WITH PROPOFOL;  Surgeon: Shellia Cleverly, DO;  Location: [facility];  Service: Gastroenterology;  Laterality: N/A;   HOT HEMOSTASIS N/A 06/23/2019   Procedure: HOT HEMOSTASIS (ARGON PLASMA COAGULATION/BICAP);  Surgeon: Shellia Cleverly, DO;  Location: [facility] ENDOSCOPY;  Service: Gastroenterology;  Laterality: N/A;    Prior to Admission medications   Medication Sig Start Date  End Date Taking? Authorizing Provider  citalopram (CELEXA) 20 MG tablet TAKE 1 TABLET (20 MG TOTAL) BY MOUTH DAILY. Patient not taking: No sig reported 02/02/20 02/01/21  [name], MD  folic acid (FOLVITE) 1 MG tablet Take 1 tablet (1 mg total) by mouth daily. Patient not taking: No sig reported 05/03/20   [name], MD  omeprazole (PRILOSEC) 40 MG capsule Take 1 capsule (40 mg total) by mouth 2 (two) times daily before a meal. Patient not taking: No sig reported 12/15/19   [name], MD  ondansetron (ZOFRAN ODT) 8 MG disintegrating tablet Take 1 tablet (8 mg total) by mouth every 8 (eight) hours as needed for nausea or vomiting. Patient not  taking: No sig reported 05/03/20   [name], MD  thiamine 100 MG tablet Take 1 tablet (100 mg total) by mouth daily. Patient not taking: No sig reported 05/03/20   [name], MD    Scheduled Meds:  enoxaparin (LOVENOX) injection  40 mg Subcutaneous Q24H   folic acid  1 mg Oral Daily   lipase/protease/amylase  36,000 Units Oral TID AC   multivitamin with minerals  1 tablet Oral Daily   thiamine  100 mg Oral Daily   Or   thiamine  100 mg Intravenous Daily   Infusions:  PRN Meds: acetaminophen, ondansetron (ZOFRAN) IV   Allergies as of 12/06/2020   (No Known Allergies)    Family History  Problem Relation Age of Onset   Diabetes Mellitus II Neg Hx    Colon cancer Neg Hx    Stomach cancer Neg Hx    Pancreatic cancer Neg Hx     Social History   Socioeconomic History   Marital status: Single    Spouse name: Not on file   Number of children: Not on file   Years of education: Not on file   Highest education level: Not on file  Occupational History   Not on file  Tobacco Use   Smoking status: Every Day    Packs/day: 1.00    Years: 30.00    Pack years: 30.00    Types: Cigarettes   Smokeless tobacco: Never  Vaping Use   Vaping Use: Never used  Substance and Sexual Activity   Alcohol use: Yes   Drug use: Never   Sexual activity: Not Currently  Other Topics Concern   Not on file  Social History Narrative   Not on file   Social Determinants of Health   Financial Resource Strain: Not on file  Food Insecurity: Not on file  Transportation Needs: Not on file  Physical Activity: Not on file  Stress: Not on file  Social Connections: Not on file  Intimate Partner Violence: Not on file    REVIEW OF SYSTEMS: Constitutional: No weakness, no fatigue ENT: Epistaxis for 2 to 3 months Pulm: Denies shortness of breath or cough. CV:  No palpitations, no LE edema.  No angina GU:  No hematuria, no frequency GI: See HPI. Heme: Unusual ENT and GI bleeding as  per HPI. Transfusions: Epic has no records of previous blood transfusion Neuro:  No headaches, no peripheral tingling or numbness Derm:  No itching, no rash or sores.  Endocrine:  No sweats or chills.  No polyuria or dysuria Immunization: No record of previous blood transfusion. Travel:  None   PHYSICAL EXAM: Vital signs in last 24 hours: Vitals:   12/11/20 0452 12/11/20 0844  BP: 132/72 (!) 143/80  Pulse: 83 80  Resp:  17  Temp: 98.2 F (36.8 C) 98 F (36.7 C)  SpO2: 99% (!) 88%   Wt Readings from Last 3 Encounters:  12/11/20 75.3 kg  12/04/20 74.8 kg  12/03/20 76.7 kg    General: Patient looks unwell but not acutely ill.  Sitting up in chair, alert. Head: No facial asymmetry or swelling.  No signs of head trauma. Eyes: No conjunctival pallor.  No scleral icterus. Ears: Not hard of hearing Nose: No congestion or discharge Mouth: Mucosa is moist, pink, clear.  Remaining teeth in fair repair.  Tongue midline. Neck: No JVD, no masses, no thyromegaly Lungs: Clear without labored breathing.  No cough. Heart: Regular, tachycardic into the low 1 teens.  No MRG. Abdomen: Soft.  Diffusely tender without guarding or rebound.  Not distended.  No HSM, masses, bruits, hernias..   Rectal: Deferred Musc/Skeltl: No joint redness, swelling or gross deformity. Extremities: No CCE. Neurologic: Oriented x3.  Moves all 4 limbs.  Constant activity of restless legs. Skin: No rash, no sores, no telangiectasia Nodes: No cervical adenopathy Psych: Cooperative, calm.  Intake/Output from previous day: 10/16 0701 - 10/17 0700 In: -  Out: 1000 [Urine:1000] Intake/Output this shift: No intake/output data recorded.  LAB RESULTS: Recent Labs    12/09/20 0343 12/10/20 0333 12/10/20 1942  WBC 5.7 6.4 6.3  HGB 7.7* 7.6* 7.5*  7.4*  HCT 25.2* 25.0* 24.9*  24.5*  PLT 390 540* 602*   BMET Lab Results  Component Value Date   NA 133 (L) 12/10/2020   NA 135 12/10/2020   NA 133 (L)  12/09/2020   K 3.8 12/10/2020   K 4.1 12/10/2020   K 3.6 12/09/2020   CL 101 12/10/2020   CL 102 12/10/2020   CL 100 12/09/2020   CO2 23 12/10/2020   CO2 25 12/10/2020   CO2 25 12/09/2020   GLUCOSE 99 12/10/2020   GLUCOSE 102 (H) 12/10/2020   GLUCOSE 100 (H) 12/09/2020   BUN 8 12/10/2020   BUN 5 (L) 12/10/2020   BUN <5 (L) 12/09/2020   CREATININE 0.81 12/10/2020   CREATININE 0.68 12/10/2020   CREATININE 0.56 (L) 12/09/2020   CALCIUM 9.3 12/10/2020   CALCIUM 9.1 12/10/2020   CALCIUM 9.0 12/09/2020   LFT Recent Labs    12/10/20 0333  PROT 7.2  ALBUMIN 2.9*  AST 67*  ALT 30  ALKPHOS 80  BILITOT 0.9   PT/INR Lab Results  Component Value Date   INR 0.9 12/07/2020   INR 1.0 12/03/2020   INR 1.0 08/26/2020   Hepatitis Panel No results for input(s): HEPBSAG, HCVAB, HEPAIGM, HEPBIGM in the last 72 hours. C-Diff No components found for: CDIFF Lipase     Component Value Date/Time   LIPASE 175 (H) 12/06/2020 2126    Drugs of Abuse     Component Value Date/Time   LABOPIA NONE DETECTED 06/21/2019 1541   COCAINSCRNUR NONE DETECTED 06/21/2019 1541   LABBENZ NONE DETECTED 06/21/2019 1541   AMPHETMU NONE DETECTED 06/21/2019 1541   THCU NONE DETECTED 06/21/2019 1541   LABBARB NONE DETECTED 06/21/2019 1541     RADIOLOGY STUDIES: No results found.   IMPRESSION:      Reported hematemesis without nausea in the setting of no inciting epistaxis.  History gastric ulcer, gastritis and H. pylori positive.  Not compliant with PPI and doubt he ever successfully completed H. pylori eradication.  Also taking Advil on a daily basis.  Has not been receiving PPI this admission and is receiving  Lovenox.  Acute on chronic normocytic anemia.  Phonic issue dating back to February 2022.  Low iron, low iron sat, B12 and folate levels okay.  Chronic, unremitting EtOH abuse.  Recent admission for alcohol withdrawal seizure, left AMA 1 week ago.     Recurrent pancreatitis.  Receiving  Creon.    PLAN:        EGD tomorrow.  Time TBD.  Currently patient agreeable though has history of leaving AMA.  Hold Lovenox.  Deceived shot this morning.  Consider parenteral iron infusion.   Jennye Moccasin  12/11/2020, 8:57 AM Phone [phone]   Attending physician's note   I have taken an interval history, reviewed the chart and examined the patient. I agree with the Advanced Practitioner's note, impression and recommendations.   55 year old homeless with continued ETOH abuse, ETOH recurrent pancreatitis/severe hepatic steatosis (without cirrhosis), malnutrition, H/O noncompliance with UGI bleed. HD stable. Hb 7.5 (baseline 8.2 2 months ago). Prev EGD 05/2019 HP gastritis, duodenal AVMs s/p APC. H/O NSAIDs.  Plan: -IV Protonix -Hold Lovenox -Trend CBC. Keep Hb>7 -EGD in a.m.  I have discussed risks and benefits. -Again discussed regarding ETOH abstinence. -Consider parenteral iron infusion. -Watch for ETOH withdrawal.    Edman Circle, MD [facility] [phone]

## 2020-12-11 NOTE — Consult Note (Addendum)
[facility] Consult: 8:57 AM 12/11/2020  LOS: 4 days    Referring Provider: Dr Welton Flakes  Primary Care Physician: Shan Levans MD, outpt clinic.   Primary Gastroenterologist:  Dr Barron Alvine.   Multiple no-shows for follow-up with MDs.    Reason for Consultation:  hematemesis, bloody stool.     HPI: Samuel Blevins is a 56 y.o. male.  Homeless.  PMH recurrent ETOH pancreatitis/groove pancreatitis.  Recurrent abd pain.  Developing pseudocyst on CT 09/2019.  Alcoholism.  Fatty liver w/O cirrhosis.  PUD, H Pylori positive (Flagyl, doxycycline, bismuth Rx 02/2020 but is unclear that he ever filled the prescription).  Hypertension.  Hyponatremia.  Anemia, past po iron Rx.    2021 EGD: Normal esophagus.  H. pylori gastritis, ablation of 6, nonbleeding duodenal AVMs. Left AMA 08/2020 when GI saw briefly for recurrent abd pain, anemia, dark stools.    Reports 2 or 3 months of epistaxis, hematemesis almost daily but not nausea.  No weight loss.  Abdominal pain is chronic.  Uses up to 800 mg ibuprofen daily for abdominal and other pain. Was admitted to hospital 10/10 -1/12 to address seizure related to alcohol withdrawal.  Hgb 8-9, iron panel consistent with IDA.  Also had low potassium and magnesium.  Treated with Ativan/CIWA protocol but left AMA.  Presented to the ED with complaint of hematemesis, dark stools, ongoing abdominal pain. Hgb 8.6 ... 7.5 over the last 8 days.  MCV mid 80s.  Platelets 500s to 600s. INR </= to 1.  Hyponatremia, 133 (129 last week).  BUN/creat wnl.   Lipase 175 last week,  LFTs w elevated AST, 127 ... 67.       12/07/20 CTAP w contrast: Groove pancreatitis. No peripancreatic fluid collections. Small foci of hypoattenuation within the pancreatic head may represent areas of interstitial edema or  tiny foci of parenchymal necrosis but are not well characterized at this time. This can be reassessed on subsequent imaging.  Severe hepatic steatosis and mild hepatomegaly, unchanged.  Aortic Atherosclerosis.  Patient has been tolerating solid food for a couple of days now.  Stool still dark and abdominal pain persists.  Drinks 750 mL or more of vodka on a daily basis.    Past Medical History:  Diagnosis Date   Alcohol withdrawal syndrome with complication (HCC)    Alcoholism (HCC)    Elevated AST (SGOT)    Homeless    Hypertension    Symptomatic anemia 11/23/2015    Past Surgical History:  Procedure Laterality Date   BIOPSY  06/23/2019   Procedure: BIOPSY;  Surgeon: Shellia Cleverly, DO;  Location: [facility];  Service: Gastroenterology;;   ESOPHAGOGASTRODUODENOSCOPY (EGD) WITH PROPOFOL N/A 06/23/2019   Procedure: ESOPHAGOGASTRODUODENOSCOPY (EGD) WITH PROPOFOL;  Surgeon: Shellia Cleverly, DO;  Location: [facility];  Service: Gastroenterology;  Laterality: N/A;   HOT HEMOSTASIS N/A 06/23/2019   Procedure: HOT HEMOSTASIS (ARGON PLASMA COAGULATION/BICAP);  Surgeon: Shellia Cleverly, DO;  Location: [facility] END[facility]COPY;  Service: Gastroenterology;  Laterality: N/A;    Prior to Admission medications   Medication Sig Start Date  End Date Taking? Authorizing Provider  citalopram (CELEXA) 20 MG tablet TAKE 1 TABLET (20 MG TOTAL) BY MOUTH DAILY. Patient not taking: No sig reported 02/02/20 02/01/21  [name], MD  folic acid (FOLVITE) 1 MG tablet Take 1 tablet (1 mg total) by mouth daily. Patient not taking: No sig reported 05/03/20   [name], MD  omeprazole (PRILOSEC) 40 MG capsule Take 1 capsule (40 mg total) by mouth 2 (two) times daily before a meal. Patient not taking: No sig reported 12/15/19   [name], MD  ondansetron (ZOFRAN ODT) 8 MG disintegrating tablet Take 1 tablet (8 mg total) by mouth every 8 (eight) hours as needed for nausea or vomiting. Patient not  taking: No sig reported 05/03/20   [name], MD  thiamine 100 MG tablet Take 1 tablet (100 mg total) by mouth daily. Patient not taking: No sig reported 05/03/20   [name], MD    Scheduled Meds:  enoxaparin (LOVENOX) injection  40 mg Subcutaneous Q24H   folic acid  1 mg Oral Daily   lipase/protease/amylase  36,000 Units Oral TID AC   multivitamin with minerals  1 tablet Oral Daily   thiamine  100 mg Oral Daily   Or   thiamine  100 mg Intravenous Daily   Infusions:  PRN Meds: acetaminophen, ondansetron (ZOFRAN) IV   Allergies as of 12/06/2020   (No Known Allergies)    Family History  Problem Relation Age of Onset   Diabetes Mellitus II Neg Hx    Colon cancer Neg Hx    Stomach cancer Neg Hx    Pancreatic cancer Neg Hx     Social History   Socioeconomic History   Marital status: Single    Spouse name: Not on file   Number of children: Not on file   Years of education: Not on file   Highest education level: Not on file  Occupational History   Not on file  Tobacco Use   Smoking status: Every Day    Packs/day: 1.00    Years: 30.00    Pack years: 30.00    Types: Cigarettes   Smokeless tobacco: Never  Vaping Use   Vaping Use: Never used  Substance and Sexual Activity   Alcohol use: Yes   Drug use: Never   Sexual activity: Not Currently  Other Topics Concern   Not on file  Social History Narrative   Not on file   Social Determinants of Health   Financial Resource Strain: Not on file  Food Insecurity: Not on file  Transportation Needs: Not on file  Physical Activity: Not on file  Stress: Not on file  Social Connections: Not on file  Intimate Partner Violence: Not on file    REVIEW OF SYSTEMS: Constitutional: No weakness, no fatigue ENT: Epistaxis for 2 to 3 months Pulm: Denies shortness of breath or cough. CV:  No palpitations, no LE edema.  No angina GU:  No hematuria, no frequency GI: See HPI. Heme: Unusual ENT and GI bleeding as  per HPI. Transfusions: Epic has no records of previous blood transfusion Neuro:  No headaches, no peripheral tingling or numbness Derm:  No itching, no rash or sores.  Endocrine:  No sweats or chills.  No polyuria or dysuria Immunization: No record of previous blood transfusion. Travel:  None   PHYSICAL EXAM: Vital signs in last 24 hours: Vitals:   12/11/20 0452 12/11/20 0844  BP: 132/72 (!) 143/80  Pulse: 83 80  Resp:  17  Temp: 98.2 F (36.8 C) 98 F (36.7 C)  SpO2: 99% (!) 88%   Wt Readings from Last 3 Encounters:  12/11/20 75.3 kg  12/04/20 74.8 kg  12/03/20 76.7 kg    General: Patient looks unwell but not acutely ill.  Sitting up in chair, alert. Head: No facial asymmetry or swelling.  No signs of head trauma. Eyes: No conjunctival pallor.  No scleral icterus. Ears: Not hard of hearing Nose: No congestion or discharge Mouth: Mucosa is moist, pink, clear.  Remaining teeth in fair repair.  Tongue midline. Neck: No JVD, no masses, no thyromegaly Lungs: Clear without labored breathing.  No cough. Heart: Regular, tachycardic into the low 1 teens.  No MRG. Abdomen: Soft.  Diffusely tender without guarding or rebound.  Not distended.  No HSM, masses, bruits, hernias..   Rectal: Deferred Musc/Skeltl: No joint redness, swelling or gross deformity. Extremities: No CCE. Neurologic: Oriented x3.  Moves all 4 limbs.  Constant activity of restless legs. Skin: No rash, no sores, no telangiectasia Nodes: No cervical adenopathy Psych: Cooperative, calm.  Intake/Output from previous day: 10/16 0701 - 10/17 0700 In: -  Out: 1000 [Urine:1000] Intake/Output this shift: No intake/output data recorded.  LAB RESULTS: Recent Labs    12/09/20 0343 12/10/20 0333 12/10/20 1942  WBC 5.7 6.4 6.3  HGB 7.7* 7.6* 7.5*  7.4*  HCT 25.2* 25.0* 24.9*  24.5*  PLT 390 540* 602*   BMET Lab Results  Component Value Date   NA 133 (L) 12/10/2020   NA 135 12/10/2020   NA 133 (L)  12/09/2020   K 3.8 12/10/2020   K 4.1 12/10/2020   K 3.6 12/09/2020   CL 101 12/10/2020   CL 102 12/10/2020   CL 100 12/09/2020   CO2 23 12/10/2020   CO2 25 12/10/2020   CO2 25 12/09/2020   GLUCOSE 99 12/10/2020   GLUCOSE 102 (H) 12/10/2020   GLUCOSE 100 (H) 12/09/2020   BUN 8 12/10/2020   BUN 5 (L) 12/10/2020   BUN <5 (L) 12/09/2020   CREATININE 0.81 12/10/2020   CREATININE 0.68 12/10/2020   CREATININE 0.56 (L) 12/09/2020   CALCIUM 9.3 12/10/2020   CALCIUM 9.1 12/10/2020   CALCIUM 9.0 12/09/2020   LFT Recent Labs    12/10/20 0333  PROT 7.2  ALBUMIN 2.9*  AST 67*  ALT 30  ALKPHOS 80  BILITOT 0.9   PT/INR Lab Results  Component Value Date   INR 0.9 12/07/2020   INR 1.0 12/03/2020   INR 1.0 08/26/2020   Hepatitis Panel No results for input(s): HEPBSAG, HCVAB, HEPAIGM, HEPBIGM in the last 72 hours. C-Diff No components found for: CDIFF Lipase     Component Value Date/Time   LIPASE 175 (H) 12/06/2020 2126    Drugs of Abuse     Component Value Date/Time   LABOPIA NONE DETECTED 06/21/2019 1541   COCAINSCRNUR NONE DETECTED 06/21/2019 1541   LABBENZ NONE DETECTED 06/21/2019 1541   AMPHETMU NONE DETECTED 06/21/2019 1541   THCU NONE DETECTED 06/21/2019 1541   LABBARB NONE DETECTED 06/21/2019 1541     RADIOLOGY STUDIES: No results found.   IMPRESSION:      Reported hematemesis without nausea in the setting of no inciting epistaxis.  History gastric ulcer, gastritis and H. pylori positive.  Not compliant with PPI and doubt he ever successfully completed H. pylori eradication.  Also taking Advil on a daily basis.  Has not been receiving PPI this admission and is receiving   Lovenox.  Acute on chronic normocytic anemia.  Phonic issue dating back to February 2022.  Low iron, low iron sat, B12 and folate levels okay.  Chronic, unremitting EtOH abuse.  Recent admission for alcohol withdrawal seizure, left AMA 1 week ago.     Recurrent pancreatitis.  Receiving  Creon.    PLAN:        EGD tomorrow.  Time TBD.  Currently patient agreeable though has history of leaving AMA.  Hold Lovenox.  Deceived shot this morning.  Consider parenteral iron infusion.   Jennye Moccasin  12/11/2020, 8:57 AM Phone [phone]   Attending physician's note   I have taken an interval history, reviewed the chart and examined the patient. I agree with the Advanced Practitioner's note, impression and recommendations.   55 year old homeless with continued ETOH abuse, ETOH recurrent pancreatitis/severe hepatic steatosis (without cirrhosis), malnutrition, H/O noncompliance with UGI bleed. HD stable. Hb 7.5 (baseline 8.2 2 months ago). Prev EGD 05/2019 HP gastritis, duodenal AVMs s/p APC. H/O NSAIDs.  Plan: -IV Protonix -Hold Lovenox -Trend CBC. Keep Hb>7 -EGD in a.m.  I have discussed risks and benefits. -Again discussed regarding ETOH abstinence. -Consider parenteral iron infusion. -Watch for ETOH withdrawal.    Edman Circle, MD [facility] [phone]

## 2020-12-11 NOTE — Progress Notes (Addendum)
HD#4 Subjective:  Overnight Events: vomiting blood  Multiple episodes of vomiting overnight per the patient. He notes that he had pain with vomiting. Notes blood in his vomit, although also notes a nosebleed just prior to onset. He had central abdominal pain that improved with diet. Endorsed dizziness with movement but denied chest pain, headache, or shortness of breath.  Objective:  Vital signs in last 24 hours: Vitals:   12/10/20 2216 12/10/20 2216 12/11/20 0452 12/11/20 0619  BP:  137/86 132/72   Pulse:  80 83   Resp:  18    Temp: 98.5 F (36.9 C) 98.5 F (36.9 C) 98.2 F (36.8 C)   TempSrc: Oral Oral Oral   SpO2:  97% 99%   Weight:    75.3 kg  Height:       Supplemental O2: Room Air SpO2: 99 %  Physical Exam:  Constitutional: Well-appearing in no acute distress HENT: normocephalic atraumatic; nasal mucosa appeared erythematous with blood clot in right nostril.  Eyes: conjunctiva non-erythematous Neck: supple Cardiovascular: regular rate and rhythm, no m/r/g Pulmonary/Chest: normal work of breathing on room air Abdominal: soft, nondistended, tenderness to palpation in middle quadrant.  MSK: normal bulk and tone Neurological: alert & oriented x 3 Skin: warm and dry Psych: Normal mood and thought process  Filed Weights   12/08/20 0500 12/10/20 0526 12/11/20 0619  Weight: 76.6 kg 76.6 kg 75.3 kg     Intake/Output Summary (Last 24 hours) at 12/11/2020 0825 Last data filed at 12/11/2020 0600 Gross per 24 hour  Intake --  Output 1000 ml  Net -1000 ml    Net IO Since Admission: -824.25 mL [12/11/20 0825]  Pertinent Labs: CBC Latest Ref Rng & Units 12/10/2020 12/10/2020 12/10/2020  WBC 4.0 - 10.5 K/uL 6.3 - 6.4  Hemoglobin 13.0 - 17.0 g/dL 7.5(L) 7.4(L) 7.6(L)  Hematocrit 39.0 - 52.0 % 24.9(L) 24.5(L) 25.0(L)  Platelets 150 - 400 K/uL 602(H) - 540(H)    CMP Latest Ref Rng & Units 12/10/2020 12/10/2020 12/09/2020  Glucose 70 - 99 mg/dL 99 630(Z) 601(U)   BUN 6 - 20 mg/dL 8 5(L) <9(N)  Creatinine 0.61 - 1.24 mg/dL 2.35 5.73 2.20(U)  Sodium 135 - 145 mmol/L 133(L) 135 133(L)  Potassium 3.5 - 5.1 mmol/L 3.8 4.1 3.6  Chloride 98 - 111 mmol/L 101 102 100  CO2 22 - 32 mmol/L 23 25 25   Calcium 8.9 - 10.3 mg/dL 9.3 9.1 9.0  Total Protein 6.5 - 8.1 g/dL - 7.2 -  Total Bilirubin 0.3 - 1.2 mg/dL - 0.9 -  Alkaline Phos 38 - 126 U/L - 80 -  AST 15 - 41 U/L - 67(H) -  ALT 0 - 44 U/L - 30 -    Imaging: No results found.  Assessment/Plan:   Active Problems:   Pancreatitis   Alcoholic steatohepatitis   Iron deficiency anemia due to chronic blood loss  Patient Summary: Samuel Blevins is a 55 y.o. with a pertinent PMH of alcohol use disorder with withdrawal seizure, iron deficiency anemia, hepatic steatosis, homelessness, who presented with nausea and abdominal pain after recently leaving AMA from hospitalization for alcohol withdrawal seizure and admitted for acute pancreatitis and epistasis on hospital day 4.   Acute on chronic pancreatitis-improving secondary to alcohol use Patient has been tolerating his diet well without any acute concerns. He has not needed pain medication and is tolerating his diet well.  Social work working with team for placement to acute alcohol rehab facility. -Tylenol for moderate  pain -Zofran for nausea -Tolerating diet   Hematemesis Bloody stool Iron deficiency anemia Epistaxis Patient had acute episode of nosebleed overnight and 1 episode of vomiting.  Hemoglobin was stable this morning at 8.2.  Denies blood in his stool or urine today. He states that he has 2-3 nosebleeds a week.  He reports that usually it is a large amount of blood.  Differentials include upper GI bleed vs internal hemorrhoids vs. colon source vs nosebleed. Unclear pattern given reported hematemesis directly following nosebleed per patient report. -GI consulted and they are planning EGD tomorrow  -NPO after midnight  -IV iron 250 mg -ENT  consulted, recommend Afrin prn in addition to nasal hygiene measures   Alcohol use disorder Patient has been without signs or symptoms of alcohol withdrawal.  He states his last drink was prior to his admission. CIWA score was 0 in last 24 hrs.  -Continue CIWA  Diet:  Soft IVF: None,None VTE: Enoxaparin Code: Full  Dispo: Anticipated discharge to rehab facility pending further medical management.    Gwenevere Abbot, MD Eligha Bridegroom. [facility] Internal Medicine Residency, PGY-1  [phone]

## 2020-12-11 NOTE — Consult Note (Signed)
ENT CONSULT:  Reason for Consult: Epistaxis  Referring Physician:  Dr.Khan  HPI: Samuel Blevins is an 55 y.o. male with past medical history of alcoholism and pancreatitis, fatty liver without cirrhosis, hypertension, anemia, peptic ulcer disease presenting for evaluation of epistaxis and hematemesis.  Patient reports right-sided nasal bleeding without identifiable trigger, denies recent history of nasal trauma or injury.  He denies previous history of epistaxis.  States he had some nasal bleeding earlier this morning, which resolved without intervention.  Past Medical History:  Diagnosis Date   Alcohol withdrawal syndrome with complication (HCC)    Alcoholism (HCC)    Elevated AST (SGOT)    Homeless    Hypertension    Symptomatic anemia 11/23/2015    Past Surgical History:  Procedure Laterality Date   BIOPSY  06/23/2019   Procedure: BIOPSY;  Surgeon: Shellia Cleverly, DO;  Location: [facility] ENDOSCOPY;  Service: Gastroenterology;;   ESOPHAGOGASTRODUODENOSCOPY (EGD) WITH PROPOFOL N/A 06/23/2019   Procedure: ESOPHAGOGASTRODUODENOSCOPY (EGD) WITH PROPOFOL;  Surgeon: Shellia Cleverly, DO;  Location: [facility] ENDOSCOPY;  Service: Gastroenterology;  Laterality: N/A;   HOT HEMOSTASIS N/A 06/23/2019   Procedure: HOT HEMOSTASIS (ARGON PLASMA COAGULATION/BICAP);  Surgeon: Shellia Cleverly, DO;  Location[facility] ENDOSCOPY;  Service: Gastroenterology;  Laterality: N/A;    Family History  Problem Relation Age of Onset   Diabetes Mellitus II Neg Hx    Colon cancer Neg Hx    Stomach cancer Neg Hx    Pancreatic cancer Neg Hx     Social History:  reports that he has been smoking. He has a 30.00 pack-year smoking history. He has never used smokeless tobacco. He reports current alcohol use. He reports that he does not use drugs.  Allergies: No Known Allergies  Medications: I have reviewed the patient's current medications.  Results for orders placed or performed during the hospital encounter of 12/06/20  (from the past 48 hour(s))  CBC with Differential/Platelet     Status: Abnormal   Collection Time: 12/10/20  3:33 AM  Result Value Ref Range   WBC 6.4 4.0 - 10.5 K/uL   RBC 2.97 (L) 4.22 - 5.81 MIL/uL   Hemoglobin 7.6 (L) 13.0 - 17.0 g/dL   HCT 62.7 (L) 03.5 - 00.9 %   MCV 84.2 80.0 - 100.0 fL   MCH 25.6 (L) 26.0 - 34.0 pg   MCHC 30.4 30.0 - 36.0 g/dL   RDW 38.1 (H) 82.9 - 93.7 %   Platelets 540 (H) 150 - 400 K/uL   nRBC 0.0 0.0 - 0.2 %   Neutrophils Relative % 68 %   Neutro Abs 4.4 1.7 - 7.7 K/uL   Lymphocytes Relative 10 %   Lymphs Abs 0.6 (L) 0.7 - 4.0 K/uL   Monocytes Relative 17 %   Monocytes Absolute 1.1 (H) 0.1 - 1.0 K/uL   Eosinophils Relative 1 %   Eosinophils Absolute 0.1 0.0 - 0.5 K/uL   Basophils Relative 4 %   Basophils Absolute 0.3 (H) 0.0 - 0.1 K/uL   nRBC 0 0 /100 WBC   Abs Immature Granulocytes 0.00 0.00 - 0.07 K/uL   Polychromasia PRESENT    Target Cells PRESENT     Comment: Performed at [facility], [address], [place], [place] [address]  Comprehensive metabolic panel     Status: Abnormal   Collection Time: 12/10/20  3:33 AM  Result Value Ref Range   Sodium 135 135 - 145 mmol/L   Potassium 4.1 3.5 - 5.1 mmol/L  Chloride 102 98 - 111 mmol/L   CO2 25 22 - 32 mmol/L   Glucose, Bld 102 (H) 70 - 99 mg/dL    Comment: Glucose reference range applies only to samples taken after fasting for at least 8 hours.   BUN 5 (L) 6 - 20 mg/dL   Creatinine, Ser 3.90 0.61 - 1.24 mg/dL   Calcium 9.1 8.9 - 30.0 mg/dL   Total Protein 7.2 6.5 - 8.1 g/dL   Albumin 2.9 (L) 3.5 - 5.0 g/dL   AST 67 (H) 15 - 41 U/L   ALT 30 0 - 44 U/L   Alkaline Phosphatase 80 38 - 126 U/L   Total Bilirubin 0.9 0.3 - 1.2 mg/dL   GFR, Estimated >92 >33 mL/min    Comment: (NOTE) Calculated using the CKD-EPI Creatinine Equation (2021)    Anion gap 8 5 - 15    Comment: Performed at [facility], [address], [place], [place] [address]  Magnesium     Status: None    Collection Time: 12/10/20  3:33 AM  Result Value Ref Range   Magnesium 1.7 1.7 - 2.4 mg/dL    Comment: Performed at [facility], [address] [place], [place] [address]  Glucose, capillary     Status: Abnormal   Collection Time: 12/10/20  7:27 AM  Result Value Ref Range   Glucose-Capillary 105 (H) 70 - 99 mg/dL    Comment: Glucose reference range applies only to samples taken after fasting for at least 8 hours.  Hemoglobin and hematocrit, blood     Status: Abnormal   Collection Time: 12/10/20  7:42 PM  Result Value Ref Range   Hemoglobin 7.4 (L) 13.0 - 17.0 g/dL   HCT 54.5 (L) 62.5 - 63.8 %    Comment: Performed at [facility], [address], [place], [place] [address]  CBC with Differential/Platelet     Status: Abnormal   Collection Time: 12/10/20  7:42 PM  Result Value Ref Range   WBC 6.3 4.0 - 10.5 K/uL   RBC 2.93 (L) 4.22 - 5.81 MIL/uL   Hemoglobin 7.5 (L) 13.0 - 17.0 g/dL   HCT 28.7 (L) 68.1 - 15.7 %   MCV 85.0 80.0 - 100.0 fL   MCH 25.6 (L) 26.0 - 34.0 pg   MCHC 30.1 30.0 - 36.0 g/dL   RDW 26.2 (H) 03.5 - 59.7 %   Platelets 602 (H) 150 - 400 K/uL   nRBC 0.0 0.0 - 0.2 %   Neutrophils Relative % 58 %   Neutro Abs 3.6 1.7 - 7.7 K/uL   Lymphocytes Relative 22 %   Lymphs Abs 1.4 0.7 - 4.0 K/uL   Monocytes Relative 16 %   Monocytes Absolute 1.0 0.1 - 1.0 K/uL   Eosinophils Relative 1 %   Eosinophils Absolute 0.1 0.0 - 0.5 K/uL   Basophils Relative 1 %   Basophils Absolute 0.1 0.0 - 0.1 K/uL   Immature Granulocytes 2 %   Abs Immature Granulocytes 0.14 (H) 0.00 - 0.07 K/uL    Comment: Performed at [facility], [address], [place], [place] [address]  Basic metabolic panel     Status: Abnormal   Collection Time: 12/10/20  7:42 PM  Result Value Ref Range   Sodium 133 (L) 135 - 145 mmol/L   Potassium 3.8 3.5 - 5.1 mmol/L   Chloride 101 98 - 111 mmol/L   CO2 23 22 - 32 mmol/L  Glucose, Bld 99 70 - 99 mg/dL    Comment: Glucose reference range  applies only to samples taken after fasting for at least 8 hours.   BUN 8 6 - 20 mg/dL   Creatinine, Ser 3.53 0.61 - 1.24 mg/dL   Calcium 9.3 8.9 - 61.4 mg/dL   GFR, Estimated >43 >15 mL/min    Comment: (NOTE) Calculated using the CKD-EPI Creatinine Equation (2021)    Anion gap 9 5 - 15    Comment: Performed at [facility], [address], [place], [place] [address]  Glucose, capillary     Status: Abnormal   Collection Time: 12/11/20  8:14 AM  Result Value Ref Range   Glucose-Capillary 133 (H) 70 - 99 mg/dL    Comment: Glucose reference range applies only to samples taken after fasting for at least 8 hours.  Type and screen [facility]     Status: None   Collection Time: 12/11/20 11:05 AM  Result Value Ref Range   ABO/RH(D) A POS    Antibody Screen NEG    Sample Expiration      12/14/2020,2359 Performed at [facility], [address] [place], [place] [address]   CBC with Differential/Platelet     Status: Abnormal   Collection Time: 12/11/20 11:06 AM  Result Value Ref Range   WBC 6.2 4.0 - 10.5 K/uL   RBC 3.25 (L) 4.22 - 5.81 MIL/uL   Hemoglobin 8.2 (L) 13.0 - 17.0 g/dL   HCT 09.3 (L) 26.7 - 12.4 %   MCV 84.0 80.0 - 100.0 fL   MCH 25.2 (L) 26.0 - 34.0 pg   MCHC 30.0 30.0 - 36.0 g/dL   RDW 58.0 (H) 99.8 - 33.8 %   Platelets 770 (H) 150 - 400 K/uL   nRBC 0.0 0.0 - 0.2 %   Neutrophils Relative % 59 %   Neutro Abs 3.7 1.7 - 7.7 K/uL   Lymphocytes Relative 20 %   Lymphs Abs 1.3 0.7 - 4.0 K/uL   Monocytes Relative 16 %   Monocytes Absolute 1.0 0.1 - 1.0 K/uL   Eosinophils Relative 1 %   Eosinophils Absolute 0.1 0.0 - 0.5 K/uL   Basophils Relative 2 %   Basophils Absolute 0.1 0.0 - 0.1 K/uL   Immature Granulocytes 2 %   Abs Immature Granulocytes 0.10 (H) 0.00 - 0.07 K/uL    Comment: Performed at [facility], [address], [place], [place] [address]    No results found.  ROS:ROS  Blood pressure (!) 143/80, pulse 80, temperature  98 F (36.7 C), temperature source Oral, resp. rate 17, height 5\' 8"  (1.727 m), weight 75.3 kg, SpO2 (!) 88 %.  PHYSICAL EXAM: CONSTITUTIONAL: well developed, nourished, no distress and alert and oriented PULMONARY/CHEST WALL: effort normal and no stridor, no stertor, no dysphonia HENT: Head : normocephalic and atraumatic Ears: Right ear:   canal normal, external ear normal and hearing normal Left ear:   canal normal, external ear normal and hearing normal Nose: Septum deviated to the left 2+, no active bleeding noted on anterior rhinoscopy, no clot in either nasal cavity.  Nasal mucosa appears healthy. Mouth/Throat: No blood or clot in oropharynx EYES: conjunctiva normal, EOM normal and PERRL NECK: supple, trachea normal and no thyromegaly or cervical LAD  Studies Reviewed:None  Assessment/Plan: Samuel Blevins is a 56 y/o M with history of chronic alcohol abuse, presenting with epistaxis and hematemesis.  No nasal bleeding since this morning,  epistaxis resolved without intervention.  No recent history of nasal trauma or injury. -Exam today demonstrates healthy appearing nasal mucosa, with no evidence of clot or active bleeding on anterior rhinoscopy. -Recommend application of nasal saline spray to bilateral nares every 4 hours -Recommend application of Aquaphor ointment to bilateral nares twice daily -Recommend Afrin with application of nasal clamp as needed for bleeding. If persistent, please notify on call ENT -Emphasized importance of avoidance of nasal manipulation, no nose blowing for 2 weeks -Patient to undergo EGD in a.m. with gastroenterology -Outpatient follow-up with ENT as needed  Thank you for allowing me to participate in the care of this patient. Please do not hesitate to contact me with any questions or concerns.   Laren Boom, DO Otolaryngology [facility] ENT Cell: [phone]   12/11/2020, 6:10 PM

## 2020-12-11 NOTE — Progress Notes (Signed)
Patient pulled his telemetry leads off.Patient refusing to wear it back.

## 2020-12-11 NOTE — TOC Progression Note (Signed)
Transition of Care [facility]) - Progression Note    Patient Details  Name: Samuel Blevins MRN: [id number] Date of Birth: [birth year]  [facility] CM/SW Contact  [name], Hershal Coria, RN Phone Number: 12/11/2020, 1:55 PM  Clinical Narrative:    Patient called for CM. CM spoke with patient in his room. Patient asking to go to rehab for his alcohol abuse. Denies telling CM about [facility] on Friday. Requesting [facility] in [place] or [facility] in [place]. CM called [facility] [phone]) and they have no availability. [facility] [employer] are requiring some sort of payment and patient is homeless. CM called [facility] in [place] and spoke with [name] [phone]) and she states they have a bed available for men. They can use patient's insurance if he has one but if not, they will still accept him. Patient notified and CM called [name] again for patient to speak with. MD notified. Patient continues with medical workup. CM will continue to follow with needs.  Expected Discharge Plan:  (Homeless) Barriers to Discharge: Continued Medical Work up, Homeless with medical needs  Expected Discharge Plan and Services Expected Discharge Plan:  (Homeless)   Discharge Planning Services: CM Consult Post Acute Care Choice: NA Living arrangements for the past 2 months: Homeless                                       Social Determinants of Health (SDOH) Interventions    Readmission Risk Interventions Readmission Risk Prevention Plan 06/22/2019  Post Dischage Appt Complete  Medication Screening Complete  Transportation Screening Complete  Some recent data might be hidden

## 2020-12-12 ENCOUNTER — Inpatient Hospital Stay (HOSPITAL_COMMUNITY): Payer: Self-pay | Admitting: Certified Registered"

## 2020-12-12 ENCOUNTER — Encounter (HOSPITAL_COMMUNITY)
Admission: EM | Disposition: A | Discharge status: Home / Self Care | Payer: Self-pay | Source: Home / Self Care | Attending: Internal Medicine

## 2020-12-12 ENCOUNTER — Other Ambulatory Visit: Payer: Self-pay | Admitting: Student

## 2020-12-12 ENCOUNTER — Encounter (HOSPITAL_COMMUNITY): Payer: Self-pay | Admitting: Internal Medicine

## 2020-12-12 ENCOUNTER — Other Ambulatory Visit (HOSPITAL_COMMUNITY): Payer: Self-pay

## 2020-12-12 DIAGNOSIS — K2970 Gastritis, unspecified, without bleeding: Secondary | ICD-10-CM

## 2020-12-12 DIAGNOSIS — K269 Duodenal ulcer, unspecified as acute or chronic, without hemorrhage or perforation: Secondary | ICD-10-CM

## 2020-12-12 HISTORY — PX: BIOPSY: SHX5522

## 2020-12-12 HISTORY — PX: ESOPHAGOGASTRODUODENOSCOPY (EGD) WITH PROPOFOL: SHX5813

## 2020-12-12 LAB — CBC WITH DIFFERENTIAL/PLATELET
Abs Immature Granulocytes: 0.1 10*3/uL — ABNORMAL HIGH (ref 0.00–0.07)
Basophils Absolute: 0.1 10*3/uL (ref 0.0–0.1)
Basophils Relative: 1 %
Eosinophils Absolute: 0.1 10*3/uL (ref 0.0–0.5)
Eosinophils Relative: 2 %
HCT: 25.4 % — ABNORMAL LOW (ref 39.0–52.0)
Hemoglobin: 7.5 g/dL — ABNORMAL LOW (ref 13.0–17.0)
Immature Granulocytes: 2 %
Lymphocytes Relative: 19 %
Lymphs Abs: 1.3 10*3/uL (ref 0.7–4.0)
MCH: 25.3 pg — ABNORMAL LOW (ref 26.0–34.0)
MCHC: 29.5 g/dL — ABNORMAL LOW (ref 30.0–36.0)
MCV: 85.5 fL (ref 80.0–100.0)
Monocytes Absolute: 1.4 10*3/uL — ABNORMAL HIGH (ref 0.1–1.0)
Monocytes Relative: 21 %
Neutro Abs: 3.8 10*3/uL (ref 1.7–7.7)
Neutrophils Relative %: 55 %
Platelets: 746 10*3/uL — ABNORMAL HIGH (ref 150–400)
RBC: 2.97 MIL/uL — ABNORMAL LOW (ref 4.22–5.81)
RDW: 19.9 % — ABNORMAL HIGH (ref 11.5–15.5)
WBC: 6.7 10*3/uL (ref 4.0–10.5)
nRBC: 0 % (ref 0.0–0.2)

## 2020-12-12 LAB — CULTURE, BLOOD (ROUTINE X 2)
Culture: NO GROWTH
Special Requests: ADEQUATE

## 2020-12-12 LAB — BASIC METABOLIC PANEL
Anion gap: 8 (ref 5–15)
BUN: 9 mg/dL (ref 6–20)
CO2: 23 mmol/L (ref 22–32)
Calcium: 9.4 mg/dL (ref 8.9–10.3)
Chloride: 104 mmol/L (ref 98–111)
Creatinine, Ser: 0.73 mg/dL (ref 0.61–1.24)
GFR, Estimated: >60 mL/min (ref >60)
Glucose, Bld: 104 mg/dL — ABNORMAL HIGH (ref 70–99)
Potassium: 4.1 mmol/L (ref 3.5–5.1)
Sodium: 135 mmol/L (ref 135–145)

## 2020-12-12 LAB — GLUCOSE, CAPILLARY: Glucose-Capillary: 108 mg/dL — ABNORMAL HIGH (ref 70–99)

## 2020-12-12 SURGERY — ESOPHAGOGASTRODUODENOSCOPY (EGD) WITH PROPOFOL
Anesthesia: Monitor Anesthesia Care

## 2020-12-12 MED ORDER — PANTOPRAZOLE SODIUM 40 MG PO TBEC: 40.0000 mg | DELAYED_RELEASE_TABLET | Freq: Every day | ORAL | 1 refills | Status: DC

## 2020-12-12 MED ORDER — FOLIC ACID 1 MG PO TABS: 1.0000 mg | ORAL_TABLET | Freq: Every day | ORAL | 3 refills | Status: AC

## 2020-12-12 MED ORDER — THIAMINE HCL 100 MG PO TABS
100.0000 mg | ORAL_TABLET | Freq: Every day | ORAL | 0 refills | Status: DC
  Filled 2020-12-12: qty 30, 30d supply, fill #0

## 2020-12-12 MED ORDER — ADULT MULTIVITAMIN W/MINERALS CH: 1.0000 | ORAL_TABLET | Freq: Every day | ORAL | 0 refills | Status: DC

## 2020-12-12 MED ORDER — FOLIC ACID 1 MG PO TABS
1.0000 mg | ORAL_TABLET | Freq: Every day | ORAL | 3 refills | Status: DC
  Filled 2020-12-12: qty 30, 30d supply, fill #0

## 2020-12-12 MED ORDER — PANCRELIPASE (LIP-PROT-AMYL) 36000-114000 UNITS PO CPEP
36000.0000 [IU] | ORAL_CAPSULE | Freq: Three times a day (TID) | ORAL | 3 refills | Status: DC
  Filled 2020-12-12: qty 180, 60d supply, fill #0

## 2020-12-12 MED ORDER — PROPOFOL 500 MG/50ML IV EMUL
INTRAVENOUS | Status: DC | PRN
  Administered 2020-12-12: 175 ug/kg/min via INTRAVENOUS

## 2020-12-12 MED ORDER — ADULT MULTIVITAMIN W/MINERALS CH
1.0000 | ORAL_TABLET | Freq: Every day | ORAL | 0 refills | Status: DC
  Filled 2020-12-12: qty 30, 30d supply, fill #0

## 2020-12-12 MED ORDER — FERROUS GLUCONATE 239 (27 FE) MG PO TABS: 1.0000 | ORAL_TABLET | ORAL | 0 refills | Status: DC

## 2020-12-12 MED ORDER — FERROUS GLUCONATE 239 (27 FE) MG PO TABS
1.0000 | ORAL_TABLET | ORAL | 0 refills | Status: DC
  Filled 2020-12-12: qty 30, fill #0

## 2020-12-12 MED ORDER — ACETAMINOPHEN 325 MG PO TABS
650.0000 mg | ORAL_TABLET | Freq: Four times a day (QID) | ORAL | 0 refills | Status: DC | PRN
  Filled 2020-12-12: qty 30, 4d supply, fill #0

## 2020-12-12 MED ORDER — PANCRELIPASE (LIP-PROT-AMYL) 36000-114000 UNITS PO CPEP: 36000.0000 [IU] | ORAL_CAPSULE | Freq: Three times a day (TID) | ORAL | 3 refills | Status: DC

## 2020-12-12 MED ORDER — PROPOFOL 10 MG/ML IV BOLUS
INTRAVENOUS | Status: DC | PRN
  Administered 2020-12-12 (×3): 10 mg via INTRAVENOUS
  Administered 2020-12-12: 20 mg via INTRAVENOUS
  Administered 2020-12-12: 10 mg via INTRAVENOUS

## 2020-12-12 MED ORDER — AQUAPHOR EX OINT
1.0000 "application " | TOPICAL_OINTMENT | Freq: Two times a day (BID) | CUTANEOUS | 0 refills | Status: DC
  Filled 2020-12-12: qty 420, fill #0

## 2020-12-12 MED ORDER — SALINE SPRAY 0.65 % NA SOLN
2.0000 | NASAL | 2 refills | Status: DC
  Filled 2020-12-12: qty 15, 30d supply, fill #0

## 2020-12-12 MED ORDER — LACTATED RINGERS IV SOLN: INTRAVENOUS | Status: DC | PRN

## 2020-12-12 MED ORDER — AQUAPHOR EX OINT
1.0000 | TOPICAL_OINTMENT | Freq: Two times a day (BID) | CUTANEOUS | 0 refills | Status: DC
Start: 2020-12-12 — End: 2021-08-02

## 2020-12-12 MED ORDER — FOLIC ACID 1 MG PO TABS: 1.0000 mg | ORAL_TABLET | Freq: Every day | ORAL | 3 refills | Status: DC

## 2020-12-12 MED ORDER — FOLIC ACID 1 MG PO TABS
1.0000 mg | ORAL_TABLET | Freq: Every day | ORAL | 0 refills | Status: DC
  Filled 2020-12-12: qty 30, 30d supply, fill #0

## 2020-12-12 MED ORDER — PANTOPRAZOLE SODIUM 40 MG PO TBEC
40.0000 mg | DELAYED_RELEASE_TABLET | Freq: Every day | ORAL | 1 refills | Status: DC
  Filled 2020-12-12: qty 30, 30d supply, fill #0

## 2020-12-12 MED ORDER — OXYMETAZOLINE HCL 0.05 % NA SOLN
3.0000 | NASAL | 0 refills | Status: DC | PRN
  Filled 2020-12-12: qty 30, fill #0

## 2020-12-12 MED ORDER — OXYMETAZOLINE HCL 0.05 % NA SOLN: 3.0000 | NASAL | 0 refills | Status: DC | PRN

## 2020-12-12 MED ORDER — THIAMINE HCL 100 MG PO TABS: 100.0000 mg | ORAL_TABLET | Freq: Every day | ORAL | 0 refills | Status: DC

## 2020-12-12 MED ORDER — SALINE SPRAY 0.65 % NA SOLN
2.0000 | NASAL | 2 refills | Status: DC
Start: 2020-12-12 — End: 2021-08-02

## 2020-12-12 SURGICAL SUPPLY — 15 items

## 2020-12-12 NOTE — Discharge Instructions (Addendum)
Samuel Blevins thank you for allowing [place] to care for you during your hospitalization.  You were initially admitted for pancreatitis, which is inflammation of your pancreas.  This was thought to be secondary to your alcohol use.  During your admission you were given IV fluids and pain medications for this.  You were also watched for alcohol withdrawal secondary to your alcohol use.  You also had frequent nosebleeds during your hospitalization and were given iron.  Because of these you are also seen by the gastroenterology (GI) doctors and ear nose throat doctors.  You had an endoscopy that did not show any active site of bleeding.  Ear nose and throat recommended daily nasal sprays and Afrin for any continued nosebleeds.  We will be discharging you to a alcohol rehabilitation facility in [facility].  You will be there for 2 weeks.  We understand that this is a great move for you, however, we need you to be on high alert for any further bleeding.  If you notice any bright red blood in your stools or dark-colored maroon stools, please let someone at the facility know and let them know that you need to see a provider.  Of note if you continue to have nosebleeds you will need to really check your blood levels to make sure that your hemoglobin is not dropping.  Please Highlight for these.  Please also be on alert for any episodes of dizziness, lightheadedness or passing out episodes.  All of these can be signs that your hemoglobin levels are low.  We will be giving you your medications prior to you discharging to the facility.  Please take these daily.  New medications are folic acid, iron supplementation, pantoprazole (acid reflux medicine that is used for GI bleeds) and Creon which is for your pancreatitis.  Please avoid taking any ibuprofen, Goody powders, Aleve, aspirin, other blood thinning medications or NSAIDs.  When you return back to [facility] after 2 weeks at the facility please follow-up with Dr. [name]   your primary care provider at the shelter.  Will eventually need to follow-up with the gastroenterology doctor here in [facility] for a colonoscopy.

## 2020-12-12 NOTE — Discharge Summary (Addendum)
Name: Samuel Blevins MRN: [id number] DOB: 14-Sep-1965 55 y.o. PCP: No primary care provider on file.  Date of Admission: 12/06/2020  8:54 PM Date of Discharge: 12/12/20 Attending Physician: Dickie La, MD  Discharge Diagnosis: 1. 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 Alcohol use disorder 3. Hematemesis 4. Epistaxis 5. Iron Deficiency Anemia 6. Hypokalemia 7. Hepatic Steatosis   Discharge Medications: Allergies as of 12/12/2020   No Known Allergies      Medication List     STOP taking these medications    citalopram 20 MG tablet Commonly known as: CELEXA   omeprazole 40 MG capsule Commonly known as: PRILOSEC   ondansetron 8 MG disintegrating tablet Commonly known as: Zofran ODT       TAKE these medications    acetaminophen 325 MG tablet Commonly known as: TYLENOL Take 2 tablets (650 mg total) by mouth every 6 (six) hours as needed for fever, moderate pain, mild pain or headache.   Ferrous Gluconate 239 (27 Fe) MG Tabs Take 1 tablet by mouth every Monday, Wednesday, and Friday. Start taking on: December 13, 2020   folic acid 1 MG tablet Commonly known as: FOLVITE Take 1 tablet (1 mg total) by mouth daily. What changed: Another medication with the same name was added. Make sure you understand how and when to take each.   folic acid 1 MG tablet Commonly known as: FOLVITE Take 1 tablet (1 mg total) by mouth daily. Start taking on: December 13, 2020 What changed: You were already taking a medication with the same name, and this prescription was added. Make sure you understand how and when to take each.   lipase/protease/amylase 01093 UNITS Cpep capsule Commonly known as: CREON Take 1 capsule (36,000 Units total) by mouth 3 (three) times daily before meals.   mineral oil-hydrophilic petrolatum ointment Apply 1 application topically 2 (two) times daily. Apply to both nostrils twice a day   multivitamin with minerals Tabs tablet Take 1 tablet by mouth  daily. Start taking on: December 13, 2020   oxymetazoline 0.05 % nasal spray Commonly known as: AFRIN Place 3 sprays into both nostrils as needed (Bleeding).   pantoprazole 40 MG tablet Commonly known as: PROTONIX Take 1 tablet (40 mg total) by mouth daily at 6 (six) AM. Start taking on: December 13, 2020   sodium chloride 0.65 % Soln nasal spray Commonly known as: OCEAN Place 2 sprays into both nostrils every 4 (four) hours while awake.   thiamine 100 MG tablet Take 1 tablet (100 mg total) by mouth daily. What changed: Another medication with the same name was added. Make sure you understand how and when to take each.   thiamine 100 MG tablet Take 1 tablet (100 mg total) by mouth daily. Start taking on: December 13, 2020 What changed: You were already taking a medication with the same name, and this prescription was added. Make sure you understand how and when to take each.        Disposition and follow-up:   [name] was discharged from [facility] in Stable condition.  At the hospital follow up visit please address: 1. 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 Alcohol use disorder 3. Hematemesis-follow up with GI outpatient.  4. Epistaxis 5. Iron Deficiency Anemia 6. Hypokalemia 7. Hepatic Steatosis   2.  Labs / imaging needed at time of follow-up: CBC, CMP  3.  Pending labs/ test needing follow-up: None  Follow-up Appointments:  Follow-up Information     Online abuse  counseling. Follow up.   Contact information: [email or web address]               Please ensure patient follows with GI outpatient.   Hospital Course by problem list: Acute on chronic pancreatitis-improving secondary to alcohol use Presented due to abdominal pain, nausea, and vomiting, found to have groove pancreatitis without loculated peripancreatic fluid collections.  Small foci of hypoattenuation within the pancreatic head likely representing edema or tiny foci of  parenchymal necrosis.  Episode of pancreatitis likely secondary to alcohol use  Pain was well controlled prior to discharge. He was tolerating food and having bowel movements.     Hematemesis/Bloody stool/iron deficiency anemia/epistaxis Patient with chronic anemia and iron deficiency noted during prior hospitalization, as well as description of melena. 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 During this admission, noted hematemesis as well as epistaxis. Difficult to tease out at times whether hematemesis was from epistaxis vs concern for upper GI bleed, however GI was consulted and performed EGD showing mild gastritis and no evidence of bleeding source.  IV iron given twice during inpatient stay. GI wanted to perform colonoscopy as outpatient.  ENT was consulted for epistaxis and recommended saline nasal spray every 4 hours along with aquaphor ointment to both nares twice daily. Afrin nasal spray as needed for bleeding.    Alcohol use disorder Patient had symptoms of alcohol withdrawal during early admission and was placed on CIWA protocol. He made progress to where he did not require any ativan and was out of withdrawal window.    Hypokalemia His potassium was repleted as needed. His K at discharge was 4.1. Please follow up to see if he needs continued supplementation.   Discharge Subjective: Patient denied any acute concerns. He did mention having dizziness but when check on later stated his dizziness was gone and he was able to walk around without getting dizzy. No further episodes of epistaxis, hematemesis, or bloody bowel movements reported by patient.   Discharge Exam:   BP 132/90 (BP Location: Right Arm)   Pulse (!) 102   Temp 98.2 F (36.8 C)   Resp 18   Ht 5\' 8"  (1.727 m)   Wt 75.3 kg   SpO2 99%   BMI 25.24 kg/m  Physical Exam General: Well-developed, well-nourished, appearing stated age. Head: Normocephalic without scalp lesions.  Eyes: Conjunctivae pink, sclerae white,  without jaundice.  Mouth: Lips normal color, without lesions. Moist mucus membrane. Neck: Neck supple with full range of motion (ROM).  Lungs: CTAB, no wheeze, rhonchi or rales.  Cardiovascular: Normal heart sounds, no r/m/g Abdomen: TTP in the middle quadrant, normal bowel sounds MSK: No asymmetry or muscle atrophy. Full range of motion (ROM) of all joints.  Skin: warm, dry, good skin turgor, no lesions Neuro: Alert and oriented. CN grossly intact Psych: Normal mood and normal affect    Pertinent Labs, Studies, and Procedures:  CBC Latest Ref Rng & Units 12/12/2020 12/11/2020 12/10/2020  WBC 4.0 - 10.5 K/uL 6.7 6.2 6.3  Hemoglobin 13.0 - 17.0 g/dL 7.5(L) 8.2(L) 7.5(L)  Hematocrit 39.0 - 52.0 % 25.4(L) 27.3(L) 24.9(L)  Platelets 150 - 400 K/uL 746(H) 770(H) 602(H)    CMP Latest Ref Rng & Units 12/12/2020 12/10/2020 12/10/2020  Glucose 70 - 99 mg/dL 12/12/2020) 99 161(W)  BUN 6 - 20 mg/dL 9 8 5(L)  Creatinine 960(A - 1.24 mg/dL 5.40 9.81 1.91  Sodium 135 - 145 mmol/L 135 133(L) 135  Potassium 3.5 - 5.1 mmol/L 4.1 3.8 4.1  Chloride  98 - 111 mmol/L 104 101 102  CO2 22 - 32 mmol/L 23 23 25   Calcium 8.9 - 10.3 mg/dL 9.4 9.3 9.1  Total Protein 6.5 - 8.1 g/dL - - 7.2  Total Bilirubin 0.3 - 1.2 mg/dL - - 0.9  Alkaline Phos 38 - 126 U/L - - 80  AST 15 - 41 U/L - - 67(H)  ALT 0 - 44 U/L - - 30   CT ABDOMEN PELVIS W CONTRAST  Result Date: 12/07/2020 CLINICAL DATA:  Hematemesis, bright red blood per rectum, abdominal pain. EXAM: CT ABDOMEN AND PELVIS WITH CONTRAST  IMPRESSION: Groove pancreatitis. No loculated peripancreatic fluid collections. Small foci of hypoattenuation within the pancreatic head may represent areas of interstitial edema or tiny foci of parenchymal necrosis but are not well characterized at this time. This can be reassessed on subsequent imaging. Severe hepatic steatosis and mild hepatomegaly, unchanged. Aortic Atherosclerosis (ICD10-I70.0). Electronically Signed   By: 12/09/2020 M.D.   On: 12/07/2020 01:28     EGD Findings and Recommendations: Findings: - Mild gastritis - Single duodenal erosion without bleeding. No UGI bleeding - The examination was otherwise normal. Recommendation: - Resume previous diet. - Continue present medications including omeprazole 20 mg p.o. QD.  ENT recommendations: -Recommend application of nasal saline spray to bilateral nares every 4 hours -Recommend application of Aquaphor ointment to bilateral nares twice daily -Recommend Afrin with application of nasal clamp as needed for bleeding. If persistent, please notify on call ENT -Emphasized importance of avoidance of nasal manipulation, no nose blowing for 2 weeks -Patient to undergo EGD in a.m. with gastroenterology -Outpatient follow-up with ENT as needed  Discharge Instructions: Discharge Instructions     Call MD for:   Complete by: As directed    Episodes of lightheadedness, dizziness, bright red blood or dark tarry stools, bloody nose that you cannot stop with prescribed medications.   Call MD for:  difficulty breathing, headache or visual disturbances   Complete by: As directed    Call MD for:  extreme fatigue   Complete by: As directed    Call MD for:  hives   Complete by: As directed    Call MD for:  persistant dizziness or light-headedness   Complete by: As directed    Call MD for:  persistant nausea and vomiting   Complete by: As directed    Call MD for:  redness, tenderness, or signs of infection (pain, swelling, redness, odor or green/yellow discharge around incision site)   Complete by: As directed    Call MD for:  severe uncontrolled pain   Complete by: As directed    Call MD for:  temperature >100.4   Complete by: As directed    Diet - low sodium heart healthy   Complete by: As directed    Increase activity slowly   Complete by: As directed        Signed: 12/09/2020, MD Gwenevere Abbot. [facility] Internal Medicine Residency, PGY-1   12/12/2020, 4:31 PM   Pager: [phone]

## 2020-12-12 NOTE — Progress Notes (Signed)
DISCHARGE NOTE HOME Samuel Blevins to be discharged Rehab per MD order. Discussed prescriptions and follow up appointments with the patient. Prescriptions given to patient; medication list explained in detail. Patient verbalized understanding.  Skin clean, dry and intact without evidence of skin break down, no evidence of skin tears noted. IV catheter discontinued intact. Site without signs and symptoms of complications. Dressing and pressure applied. Pt denies pain at the site currently. No complaints noted.  Patient free of lines, drains, and wounds.   An After Visit Summary (AVS) was printed and given to the patient. Patient escorted via wheelchair, and discharged home via private auto.  [name], RN

## 2020-12-12 NOTE — Op Note (Addendum)
[facility] Patient Name: Samuel Blevins Procedure Date : 12/12/2020 MRN: [id number] Attending MD: Lynann Bologna , MD Date of Birth: January 07, 1966 CSN: [id number] Age: 55 Admit Type:  THIS EXAM WAS SENT IN ERROR

## 2020-12-12 NOTE — Transfer of Care (Signed)
Immediate Anesthesia Transfer of Care Note  Patient: Samuel Blevins  Procedure(s) Performed: ESOPHAGOGASTRODUODENOSCOPY (EGD) WITH PROPOFOL BIOPSY  Patient Location: PACU  Anesthesia Type:MAC  Level of Consciousness: awake, drowsy and patient cooperative  Airway & Oxygen Therapy: Patient Spontanous Breathing and Patient connected to nasal cannula oxygen  Post-op Assessment: Report given to RN, Post -op Vital signs reviewed and stable and Patient moving all extremities X 4  Post vital signs: Reviewed and stable  Last Vitals:  Vitals Value Taken Time  BP 101/56 12/12/20 0940  Temp    Pulse 88 12/12/20 0942  Resp 22 12/12/20 0942  SpO2 100 % 12/12/20 0942  Vitals shown include unvalidated device data.  Last Pain:  Vitals:   12/12/20 0800  TempSrc: Temporal  PainSc: 0-No pain      Patients Stated Pain Goal: 1 (06/77/03 4035)  Complications: No notable events documented.

## 2020-12-12 NOTE — Anesthesia Preprocedure Evaluation (Signed)
Anesthesia Evaluation  Patient identified by MRN, date of birth, ID band Patient awake    Reviewed: Allergy & Precautions, NPO status , Patient's Chart, lab work & pertinent test results  History of Anesthesia Complications Negative for: history of anesthetic complications  Airway Mallampati: III  TM Distance: >3 FB Neck ROM: Full    Dental  (+) Dental Advisory Given, Chipped, Poor Dentition   Pulmonary neg pulmonary ROS, Current Smoker and Patient abstained from smoking.,    breath sounds clear to auscultation       Cardiovascular hypertension,  Rhythm:Regular     Neuro/Psych Seizures -,  negative psych ROS   GI/Hepatic GERD  ,(+) Hepatitis -? GI bleed   Endo/Other  negative endocrine ROS  Renal/GU negative Renal ROS     Musculoskeletal negative musculoskeletal ROS (+)   Abdominal   Peds  Hematology  (+) Blood dyscrasia, anemia , Lab Results      Component                Value               Date                      WBC                      6.7                 12/12/2020                HGB                      7.5 (L)             12/12/2020                HCT                      25.4 (L)            12/12/2020                MCV                      85.5                12/12/2020                PLT                      746 (H)             12/12/2020              Anesthesia Other Findings   Reproductive/Obstetrics                             Anesthesia Physical Anesthesia Plan  ASA: 3  Anesthesia Plan: MAC   Post-op Pain Management:    Induction: Intravenous  PONV Risk Score and Plan: Propofol infusion and Treatment may vary due to age or medical condition  Airway Management Planned: Nasal Cannula  Additional Equipment: None  Intra-op Plan:   Post-operative Plan:   Informed Consent: I have reviewed the patients History and Physical, chart, labs and discussed the procedure  including the risks, benefits and alternatives for the proposed anesthesia with the patient or authorized  representative who has indicated his/her understanding and acceptance.     Dental advisory given  Plan Discussed with: CRNA and Anesthesiologist  Anesthesia Plan Comments:         Anesthesia Quick Evaluation

## 2020-12-12 NOTE — Plan of Care (Signed)
  Problem: Education: Goal: Knowledge of General Education information will improve Description: Including pain rating scale, medication(s)/side effects and non-pharmacologic comfort measures Outcome: Completed/Met   Problem: Clinical Measurements: Goal: Ability to maintain clinical measurements within normal limits will improve Outcome: Completed/Met Goal: Will remain free from infection Outcome: Completed/Met   

## 2020-12-12 NOTE — Op Note (Addendum)
[facility] Patient Name: Samuel Blevins Procedure Date : 12/12/2020 MRN: [id number] Attending MD: Lynann Bologna , MD Date of Birth: 1965/06/26 CSN: [id number] Age: 55 Admit Type: Inpatient Procedure:                Upper GI endoscopy Indications:              Suspected upper gastrointestinal bleeding. H/O HP                            gastritis and AVMs Providers:                Lynann Bologna, MD, Roselie Awkward, RN, Alan Ripper Referring MD:              Medicines:                Monitored Anesthesia Care Complications:            No immediate complications. Estimated Blood Loss:     Estimated blood loss: none. Procedure:                Pre-Anesthesia Assessment:                           - Prior to the procedure, a History and Physical                            was performed, and patient medications and                            allergies were reviewed. The patient's tolerance of                            previous anesthesia was also reviewed. The risks                            and benefits of the procedure and the sedation                            options and risks were discussed with the patient.                            All questions were answered, and informed consent                            was obtained. Prior Anticoagulants: The patient has                            taken no previous anticoagulant or antiplatelet                            agents. ASA Grade Assessment: II - A patient with                            mild systemic disease. After reviewing the risks  and benefits, the patient was deemed in                            satisfactory condition to undergo the procedure.                           After obtaining informed consent, the endoscope was                            passed under direct vision. Throughout the                            procedure, the patient's blood pressure, pulse, and                             oxygen saturations were monitored continuously. The                            [id number]-[id number] ([id number]) Olympus endoscope was introduced                            through the mouth, and advanced to the second part                            of duodenum. The upper GI endoscopy was                            accomplished without difficulty. The patient                            tolerated the procedure well. Scope In: Scope Out: Findings:      The examined esophagus was normal with well-defined Z-line at 42 cm. No       esophageal varices      Diffuse mild inflammation characterized by erythema was found in the       gastric antrum. Biopsies were taken with a cold forceps for histology.       No fundal varices.      A single localized erosion without bleeding was found in the duodenal       bulb. The second portion of the duodenum was normal. No AVMs. No active       bleeding.      The exam was otherwise without abnormality. Impression:               - Mild gastritis                           - Single duodenal erosion without bleeding. No UGI                            bleeding                           - The examination was otherwise normal. Recommendation:           - Resume previous diet.                           -  Continue present medications including omeprazole                            20 mg p.o. QD.                           - Await pathology results.                           - Avoid/minimize nonsteroidals                           - Stop ETOH.                           - FU GI as outpt. Recommend colonoscopy as outpt.                            Pt wanted to go home and was threatening to sign                            AMA as previous adm.                           - Pl give parenteral iron before discharge. He                            should also start on iron/folic acid supplements.                           - The findings and recommendations were discussed                             with the patient. Procedure Code(s):        --- Professional ---                           [phone], Esophagogastroduodenoscopy, flexible,                            transoral; with biopsy, single or multiple Diagnosis Code(s):        --- Professional ---                           K29.70, Gastritis, unspecified, without bleeding                           K26.9, Duodenal ulcer, unspecified as acute or                            chronic, without hemorrhage or perforation CPT copyright 2019 American Medical Association. All rights reserved. The codes documented in this report are preliminary and upon coder review may  be revised to meet current compliance requirements. Lynann Bologna, MD 12/12/2020 9:49:12 AM This report has been signed electronically. Number of Addenda: 0

## 2020-12-12 NOTE — Interval H&P Note (Signed)
History and Physical Interval Note:  12/12/2020 9:05 AM  Samuel Blevins  has presented today for surgery, with the diagnosis of Hematemesis w/o nausea, dark stools, epistaxis, anemia.  The various methods of treatment have been discussed with the patient and family. After consideration of risks, benefits and other options for treatment, the patient has consented to  Procedure(s): ESOPHAGOGASTRODUODENOSCOPY (EGD) WITH PROPOFOL (N/A) as a surgical intervention.  The patient's history has been reviewed, patient examined, no change in status, stable for surgery.  I have reviewed the patient's chart and labs.  Questions were answered to the patient's satisfaction.     Lynann Bologna

## 2020-12-12 NOTE — Progress Notes (Signed)
Pt off unit in endo.

## 2020-12-12 NOTE — TOC Transition Note (Addendum)
Transition of Care [facility]) - CM/SW Discharge Note   Patient Details  Name: Samuel Blevins MRN: [id number] Date of Birth: Oct 06, 1965  [facility] CM/SW Contact:  [name], Hershal Coria, RN Phone Number: 12/12/2020, 4:55 PM   Clinical Narrative:    Patient is scheduled for discharge. CM called and spoke with [name] to see if there is a bed available to day for patient. [name] states they have one bed available and were closing at 5 pm. CM notified MD. CM called [employer] at 4:06 and scheduled transportation with [facility]. Meds sent to [facility] pharmacy at 4:27 pm and CM called and was told [facility] pharmacy closes at 5 pm and they were doing there last delivery and the computers were shut down. CM notified MD and prescriptions were printed out. MATCH letter done and given to patient to get it filled out at [facility] pharmacy. Patient notified. No further TOC needs.     Final next level of care: Other (comment) ([facility] for Detox) Barriers to Discharge: No Barriers Identified   Patient Goals and CMS Choice Patient states their goals for this hospitalization and ongoing recovery are:: Going to [facility] for Detox CMS Medicare.gov Compare Post Acute Care list provided to:: Patient Choice offered to / list presented to : Patient  Discharge Placement                       Discharge Plan and Services   Discharge Planning Services: CM Consult Post Acute Care Choice: NA          DME Arranged: N/A DME Agency: NA       HH Arranged: NA HH Agency: NA        Social Determinants of Health (SDOH) Interventions     Readmission Risk Interventions Readmission Risk Prevention Plan 06/22/2019  Post Dischage Appt Complete  Medication Screening Complete  Transportation Screening Complete  Some recent data might be hidden

## 2020-12-13 ENCOUNTER — Encounter (HOSPITAL_COMMUNITY): Payer: Self-pay | Admitting: Gastroenterology

## 2020-12-13 LAB — SURGICAL PATHOLOGY

## 2020-12-14 NOTE — Anesthesia Postprocedure Evaluation (Signed)
Anesthesia Post Note  Patient: Samuel Blevins  Procedure(s) Performed: ESOPHAGOGASTRODUODENOSCOPY (EGD) WITH PROPOFOL BIOPSY     Patient location during evaluation: PACU Anesthesia Type: MAC Level of consciousness: awake and alert Pain management: pain level controlled Vital Signs Assessment: post-procedure vital signs reviewed and stable Respiratory status: spontaneous breathing, nonlabored ventilation, respiratory function stable and patient connected to nasal cannula oxygen Cardiovascular status: stable and blood pressure returned to baseline Postop Assessment: no apparent nausea or vomiting Anesthetic complications: no   No notable events documented.  Last Vitals:  Vitals:   12/12/20 1002 12/12/20 1016  BP: 113/75 132/90  Pulse: 80 (!) 102  Resp: 15 18  Temp: 36.7 C 36.8 C  SpO2: 100% 99%    Last Pain:  Vitals:   12/12/20 1055  TempSrc:   PainSc: 0-No pain                 Ezmeralda Stefanick

## 2021-01-05 ENCOUNTER — Other Ambulatory Visit: Payer: Self-pay

## 2021-01-05 ENCOUNTER — Emergency Department (HOSPITAL_COMMUNITY): Payer: Self-pay

## 2021-01-05 ENCOUNTER — Emergency Department (HOSPITAL_COMMUNITY)
Admission: EM | Admit: 2021-01-05 | Discharge: 2021-01-06 | Disposition: A | Discharge status: Home / Self Care | Payer: Self-pay | Attending: Emergency Medicine | Admitting: Emergency Medicine

## 2021-01-05 DIAGNOSIS — F17210 Nicotine dependence, cigarettes, uncomplicated: Secondary | ICD-10-CM | POA: Insufficient documentation

## 2021-01-05 DIAGNOSIS — Z8719 Personal history of other diseases of the digestive system: Secondary | ICD-10-CM

## 2021-01-05 DIAGNOSIS — R112 Nausea with vomiting, unspecified: Secondary | ICD-10-CM | POA: Insufficient documentation

## 2021-01-05 DIAGNOSIS — I10 Essential (primary) hypertension: Secondary | ICD-10-CM | POA: Insufficient documentation

## 2021-01-05 DIAGNOSIS — R531 Weakness: Secondary | ICD-10-CM | POA: Insufficient documentation

## 2021-01-05 DIAGNOSIS — R1013 Epigastric pain: Secondary | ICD-10-CM | POA: Insufficient documentation

## 2021-01-05 LAB — COMPREHENSIVE METABOLIC PANEL
ALT: 27 U/L (ref 0–44)
AST: 61 U/L — ABNORMAL HIGH (ref 15–41)
Albumin: 3.5 g/dL (ref 3.5–5.0)
Alkaline Phosphatase: 70 U/L (ref 38–126)
Anion gap: 12 (ref 5–15)
BUN: 8 mg/dL (ref 6–20)
CO2: 22 mmol/L (ref 22–32)
Calcium: 8.8 mg/dL — ABNORMAL LOW (ref 8.9–10.3)
Chloride: 104 mmol/L (ref 98–111)
Creatinine, Ser: 0.75 mg/dL (ref 0.61–1.24)
GFR, Estimated: >60 mL/min (ref >60)
Glucose, Bld: 132 mg/dL — ABNORMAL HIGH (ref 70–99)
Potassium: 3.3 mmol/L — ABNORMAL LOW (ref 3.5–5.1)
Sodium: 138 mmol/L (ref 135–145)
Total Bilirubin: 0.6 mg/dL (ref 0.3–1.2)
Total Protein: 7.5 g/dL (ref 6.5–8.1)

## 2021-01-05 LAB — CBC WITH DIFFERENTIAL/PLATELET
Abs Immature Granulocytes: 0.01 10*3/uL (ref 0.00–0.07)
Basophils Absolute: 0 10*3/uL (ref 0.0–0.1)
Basophils Relative: 1 %
Eosinophils Absolute: 0 10*3/uL (ref 0.0–0.5)
Eosinophils Relative: 0 %
HCT: 27.9 % — ABNORMAL LOW (ref 39.0–52.0)
Hemoglobin: 8.5 g/dL — ABNORMAL LOW (ref 13.0–17.0)
Immature Granulocytes: 0 %
Lymphocytes Relative: 52 %
Lymphs Abs: 1.3 10*3/uL (ref 0.7–4.0)
MCH: 25.9 pg — ABNORMAL LOW (ref 26.0–34.0)
MCHC: 30.5 g/dL (ref 30.0–36.0)
MCV: 85.1 fL (ref 80.0–100.0)
Monocytes Absolute: 0.2 10*3/uL (ref 0.1–1.0)
Monocytes Relative: 9 %
Neutro Abs: 1 10*3/uL — ABNORMAL LOW (ref 1.7–7.7)
Neutrophils Relative %: 38 %
Platelets: 106 10*3/uL — ABNORMAL LOW (ref 150–400)
RBC: 3.28 MIL/uL — ABNORMAL LOW (ref 4.22–5.81)
RDW: 20.7 % — ABNORMAL HIGH (ref 11.5–15.5)
WBC: 2.6 10*3/uL — ABNORMAL LOW (ref 4.0–10.5)
nRBC: 0 % (ref 0.0–0.2)

## 2021-01-05 LAB — ETHANOL: Alcohol, Ethyl (B): 289 mg/dL — ABNORMAL HIGH (ref <10)

## 2021-01-05 LAB — LIPASE, BLOOD: Lipase: 98 U/L — ABNORMAL HIGH (ref 11–51)

## 2021-01-05 NOTE — ED Provider Notes (Signed)
Emergency Medicine Provider Triage Evaluation Note  Samuel Blevins , a 55 y.o. male  was evaluated in triage.  Pt complains of generalized weakness, dizziness, several syncopal episodes.  Patient reports that he fell out at the store earlier today hitting his head in the process.  He reports this is why EMS was called.  He states that he currently has a headache.  He does not believe he is anticoagulated.  He also complains of continued abdominal pain, nausea, vomiting, hematemesis.  He was recently admitted for pancreatitis and gastritis.  He states that he continues to drink.  He denies drinking today however does appear slightly intoxicated at this time.  Review of Systems  Positive: + dizziness, weakness, abd pain, nausea, vomiting, head injury Negative: - diarrhea  Physical Exam  BP 132/80 (BP Location: Right Arm)   Pulse (!) 116   Temp 98.9 F (37.2 C) (Oral)   Resp 18   SpO2 96%  Gen:   Awake, no distress   Resp:  Normal effort  MSK:   Moves extremities without difficulty  Other:  Diffuse abd TTP  Medical Decision Making  Medically screening exam initiated at 2:34 PM.  Appropriate orders placed.  Samuel Blevins was informed that the remainder of the evaluation will be completed by another provider, this initial triage assessment does not replace that evaluation, and the importance of remaining in the ED until their evaluation is complete.     Tanda Rockers, PA-C 01/05/21 1436    Arby Barrette, MD 01/05/21 1504

## 2021-01-05 NOTE — ED Triage Notes (Signed)
Pt from bus stop via EMS. Pt with weakness, dizziness, several syncopal episodes, abdominal pain, vomiting, and hematemesis x 2 weeks. Pt recently seen and admitted here for pancreatitis, gastritis. During triage, pt got onto all fours on the floor and pretended like he could not stand up and is abrasive and rude with his language.

## 2021-01-06 LAB — URINALYSIS, ROUTINE W REFLEX MICROSCOPIC
Bacteria, UA: NONE SEEN
Bilirubin Urine: NEGATIVE
Glucose, UA: NEGATIVE mg/dL
Hgb urine dipstick: NEGATIVE
Ketones, ur: 20 mg/dL — AB
Leukocytes,Ua: NEGATIVE
Nitrite: NEGATIVE
Protein, ur: 30 mg/dL — AB
Specific Gravity, Urine: 1.021 (ref 1.005–1.030)
pH: 5 (ref 5.0–8.0)

## 2021-01-06 MED ORDER — SODIUM CHLORIDE 0.9 % IV BOLUS
1000.0000 mL | Freq: Once | INTRAVENOUS | Status: AC
  Administered 2021-01-06: 1000 mL via INTRAVENOUS

## 2021-01-06 MED ORDER — KETOROLAC TROMETHAMINE 30 MG/ML IJ SOLN
30.0000 mg | Freq: Once | INTRAMUSCULAR | Status: AC
  Administered 2021-01-06: 30 mg via INTRAVENOUS
  Filled 2021-01-06: qty 1

## 2021-01-06 MED ORDER — SUCRALFATE 1 G PO TABS: 1.0000 g | ORAL_TABLET | Freq: Three times a day (TID) | ORAL | 0 refills | Status: DC

## 2021-01-06 MED ORDER — ONDANSETRON HCL 4 MG/2ML IJ SOLN
4.0000 mg | Freq: Once | INTRAMUSCULAR | Status: AC
  Administered 2021-01-06: 4 mg via INTRAVENOUS
  Filled 2021-01-06: qty 2

## 2021-01-06 NOTE — Discharge Instructions (Addendum)
Increase your dose of Protonix to 40 mg twice daily for the next 2 weeks.  Begin taking Carafate as prescribed.  Follow-up with gastroenterology if not improving in the next few days.  Refrain from alcohol use as I believe this is worsening your symptoms.

## 2021-01-06 NOTE — ED Provider Notes (Signed)
[facility] EMERGENCY DEPARTMENT Provider Note   CSN: [id number] Arrival date & time: 01/05/21  1407     History Chief Complaint  Patient presents with   Weakness    Samuel Blevins is a 55 y.o. male.  Patient is a 55 year old male with past medical history of chronic alcoholism, recurrent pancreatitis, hypertension.  Patient presenting today for evaluation of weakness, feeling dizzy, and falling.  He also reports nausea and vomiting.  He was recently admitted for pancreatitis and ultimately underwent endoscopy showing AVM, but no active bleeding.  He reports to be seeing occasional blood in his stool as well as feeling dizzy and nauseated.  He also reports episodes where he falls and loses his balance.  The history is provided by the patient.  Weakness Severity:  Moderate Onset quality:  Gradual Duration:  3 days Timing:  Intermittent Progression:  Worsening Chronicity:  New     Past Medical History:  Diagnosis Date   Alcohol withdrawal syndrome with complication (HCC)    Alcoholism (HCC)    Elevated AST (SGOT)    Homeless    Hypertension    Symptomatic anemia 11/23/2015    Patient Active Problem List   Diagnosis Date Noted   Pancreatitis 12/07/2020   Alcoholic steatohepatitis 12/07/2020   Iron deficiency anemia due to chronic blood loss 12/07/2020   Seizure (HCC) 12/04/2020   Gastrointestinal hemorrhage    Acute pancreatitis 09/16/2020   Alcohol withdrawal (HCC) 09/16/2020   Chronic pain syndrome 12/28/2019   Pancreatic pseudocyst 10/28/2019   Pancreatitis, acute 10/28/2019   Gastritis and gastroduodenitis    AVM (arteriovenous malformation) of small bowel, acquired    ETOH abuse 06/21/2019   Tobacco abuse 03/17/2014   Essential hypertension 03/17/2014    Past Surgical History:  Procedure Laterality Date   BIOPSY  06/23/2019   Procedure: BIOPSY;  Surgeon: Shellia Cleverly, DO;  Location: [facility] ENDOSCOPY;  Service: Gastroenterology;;    BIOPSY  12/12/2020   Procedure: BIOPSY;  Surgeon: Lynann Bologna, MD;  Location: [facility] ENDOSCOPY;  Service: Endoscopy;;   ESOPHAGOGASTRODUODENOSCOPY (EGD) WITH PROPOFOL N/A 06/23/2019   Procedure: ESOPHAGOGASTRODUODENOSCOPY (EGD) WITH PROPOFOL;  Surgeon: Shellia Cleverly, DO;  Location: [facility] ENDOSCOPY;  Service: Gastroenterology;  Laterality: N/A;   ESOPHAGOGASTRODUODENOSCOPY (EGD) WITH PROPOFOL N/A 12/12/2020   Procedure: ESOPHAGOGASTRODUODENOSCOPY (EGD) WITH PROPOFOL;  Surgeon: Lynann Bologna, MD;  Location: [facility] ENDOSCOPY;  Service: Endoscopy;  Laterality: N/A;   HOT HEMOSTASIS N/A 06/23/2019   Procedure: HOT HEMOSTASIS (ARGON PLASMA COAGULATION/BICAP);  Surgeon: Shellia Cleverly, DO;  Location: [facility] ENDOSCOPY;  Service: Gastroenterology;  Laterality: N/A;       Family History  Problem Relation Age of Onset   Diabetes Mellitus II Neg Hx    Colon cancer Neg Hx    Stomach cancer Neg Hx    Pancreatic cancer Neg Hx     Social History   Tobacco Use   Smoking status: Every Day    Packs/day: 1.00    Years: 30.00    Pack years: 30.00    Types: Cigarettes   Smokeless tobacco: Never  Vaping Use   Vaping Use: Never used  Substance Use Topics   Alcohol use: Yes   Drug use: Never    Home Medications Prior to Admission medications   Medication Sig Start Date End Date Taking? Authorizing Provider  acetaminophen (TYLENOL) 325 MG tablet Take 2 tablets (650 mg total) by mouth every 6 (six) hours as needed for fever, moderate pain, mild pain or headache. 12/12/20  Belva Agee, MD  Ferrous Gluconate 239 (27 Fe) MG TABS Take 1 tablet by mouth every Monday, Wednesday, and Friday. 12/13/20   Katsadouros, Vasilios, MD  folic acid (FOLVITE) 1 MG tablet Take 1 tablet (1 mg total) by mouth daily. 12/13/20   Katsadouros, Vasilios, MD  folic acid (FOLVITE) 1 MG tablet Take 1 tablet (1 mg total) by mouth daily. 12/12/20 04/11/21  Belva Agee, MD  lipase/protease/amylase (CREON) 36000 UNITS  CPEP capsule Take 1 capsule (36,000 Units total) by mouth 3 (three) times daily before meals. 12/12/20   Katsadouros, Vasilios, MD  mineral oil-hydrophilic petrolatum (AQUAPHOR) ointment Apply 1 application topically 2 (two) times daily. Apply to both nostrils twice a day 12/12/20   Belva Agee, MD  Multiple Vitamin (MULTIVITAMIN WITH MINERALS) TABS tablet Take 1 tablet by mouth daily. 12/13/20   Katsadouros, Vasilios, MD  oxymetazoline (AFRIN) 0.05 % nasal spray Place 3 sprays into both nostrils as needed (Bleeding). 12/12/20   Belva Agee, MD  pantoprazole (PROTONIX) 40 MG tablet Take 1 tablet (40 mg total) by mouth daily at 6 (six) AM. 12/13/20   Katsadouros, Vasilios, MD  sodium chloride (OCEAN) 0.65 % SOLN nasal spray Place 2 sprays into both nostrils every 4 (four) hours while awake. 12/12/20   Belva Agee, MD  thiamine 100 MG tablet Take 1 tablet (100 mg total) by mouth daily. 12/13/20   Belva Agee, MD  thiamine 100 MG tablet Take 1 tablet (100 mg total) by mouth daily. 12/12/20   Belva Agee, MD    Allergies    Patient has no known allergies.  Review of Systems   Review of Systems  Neurological:  Positive for weakness.  All other systems reviewed and are negative.  Physical Exam Updated Vital Signs BP (!) 173/86   Pulse 97   Temp 98.6 F (37 C) (Oral)   Resp (!) 21   SpO2 96%   Physical Exam Vitals and nursing note reviewed.  Constitutional:      General: He is not in acute distress.    Appearance: He is well-developed. He is not diaphoretic.  HENT:     Head: Normocephalic and atraumatic.  Eyes:     Extraocular Movements: Extraocular movements intact.     Pupils: Pupils are equal, round, and reactive to light.  Cardiovascular:     Rate and Rhythm: Normal rate and regular rhythm.     Heart sounds: No murmur heard.   No friction rub.  Pulmonary:     Effort: Pulmonary effort is normal. No respiratory distress.      Breath sounds: Normal breath sounds. No wheezing or rales.  Abdominal:     General: Bowel sounds are normal. There is no distension.     Palpations: Abdomen is soft.     Tenderness: There is abdominal tenderness. There is no right CVA tenderness, left CVA tenderness, guarding or rebound.     Hernia: No hernia is present.     Comments: There is tenderness to palpation in the epigastric region.  There is no rebound or guarding.  Musculoskeletal:        General: Normal range of motion.     Cervical back: Normal range of motion and neck supple.  Skin:    General: Skin is warm and dry.  Neurological:     General: No focal deficit present.     Mental Status: He is alert and oriented to person, place, and time.     Cranial Nerves: No cranial nerve deficit.  Motor: No weakness.     Coordination: Coordination normal.    ED Results / Procedures / Treatments   Labs (all labs ordered are listed, but only abnormal results are displayed) Labs Reviewed  CBC WITH DIFFERENTIAL/PLATELET - Abnormal; Notable for the following components:      Result Value   WBC 2.6 (*)    RBC 3.28 (*)    Hemoglobin 8.5 (*)    HCT 27.9 (*)    MCH 25.9 (*)    RDW 20.7 (*)    Platelets 106 (*)    Neutro Abs 1.0 (*)    All other components within normal limits  COMPREHENSIVE METABOLIC PANEL - Abnormal; Notable for the following components:   Potassium 3.3 (*)    Glucose, Bld 132 (*)    Calcium 8.8 (*)    AST 61 (*)    All other components within normal limits  LIPASE, BLOOD - Abnormal; Notable for the following components:   Lipase 98 (*)    All other components within normal limits  ETHANOL - Abnormal; Notable for the following components:   Alcohol, Ethyl (B) 289 (*)    All other components within normal limits  URINALYSIS, ROUTINE W REFLEX MICROSCOPIC    EKG None  Radiology CT Head Wo Contrast  Result Date: 01/05/2021 CLINICAL DATA:  head injury s/p fall EXAM: CT HEAD WITHOUT CONTRAST  TECHNIQUE: Contiguous axial images were obtained from the base of the skull through the vertex without intravenous contrast. COMPARISON:  None. FINDINGS: Brain: No evidence of acute infarction, hemorrhage, hydrocephalus, extra-axial collection or mass lesion/mass effect. Vascular: No hyperdense vessel identified. Calcific atherosclerosis. Skull: High posterior scalp contusion without acute fracture. Sinuses/Orbits: Remote left medial orbital wall fracture. Clear visualized sinuses. Unremarkable orbits. Other: No mastoid effusions. IMPRESSION: 1. No evidence of acute intracranial abnormality 2. High posterior scalp contusion without acute fracture. Electronically Signed   By: Feliberto Harts M.D.   On: 01/05/2021 16:17    Procedures Procedures   Medications Ordered in ED Medications  sodium chloride 0.9 % bolus 1,000 mL (has no administration in time range)  ondansetron (ZOFRAN) injection 4 mg (has no administration in time range)  ketorolac (TORADOL) 30 MG/ML injection 30 mg (has no administration in time range)    ED Course  I have reviewed the triage vital signs and the nursing notes.  Pertinent labs & imaging results that were available during my care of the patient were reviewed by me and considered in my medical decision making (see chart for details).    MDM Rules/Calculators/A&P  Patient with history of chronic alcohol abuse and recurrent pancreatitis/gastritis.  Patient presenting with complaints of abdominal pain and vomiting.  This has been ongoing for the past several days.  Patient admitted recently with similar complaints.  Patient's abdominal exam is benign and laboratory studies are consistent with baseline.  It is 8.5 which is unchanged from prior studies.  He has no leukocytosis and LFTs and lipase are mildly elevated.  Patient is nontoxic in appearance.  He was hydrated with normal saline and given medication for pain and nausea.  He seems to be feeling better and I feel  as though can be safely discharged.  Final Clinical Impression(s) / ED Diagnoses Final diagnoses:  None    Rx / DC Orders ED Discharge Orders     None        Geoffery Lyons, MD 01/06/21 [phone]

## 2021-05-05 ENCOUNTER — Inpatient Hospital Stay (HOSPITAL_COMMUNITY)
Admission: EM | Admit: 2021-05-05 | Discharge: 2021-05-06 | DRG: 378 | Discharge status: Against Medical Advice | Payer: 59 | Attending: Internal Medicine | Admitting: Internal Medicine

## 2021-05-05 ENCOUNTER — Encounter (HOSPITAL_COMMUNITY): Payer: Self-pay

## 2021-05-05 ENCOUNTER — Other Ambulatory Visit: Payer: Self-pay

## 2021-05-05 DIAGNOSIS — Z5900 Homelessness unspecified: Secondary | ICD-10-CM

## 2021-05-05 DIAGNOSIS — Z8619 Personal history of other infectious and parasitic diseases: Secondary | ICD-10-CM

## 2021-05-05 DIAGNOSIS — F17210 Nicotine dependence, cigarettes, uncomplicated: Secondary | ICD-10-CM | POA: Diagnosis present

## 2021-05-05 DIAGNOSIS — I10 Essential (primary) hypertension: Secondary | ICD-10-CM | POA: Diagnosis present

## 2021-05-05 DIAGNOSIS — K920 Hematemesis: Principal | ICD-10-CM | POA: Diagnosis present

## 2021-05-05 DIAGNOSIS — Z720 Tobacco use: Secondary | ICD-10-CM | POA: Diagnosis present

## 2021-05-05 DIAGNOSIS — D61818 Other pancytopenia: Secondary | ICD-10-CM | POA: Diagnosis present

## 2021-05-05 DIAGNOSIS — Z8711 Personal history of peptic ulcer disease: Secondary | ICD-10-CM

## 2021-05-05 DIAGNOSIS — K922 Gastrointestinal hemorrhage, unspecified: Secondary | ICD-10-CM | POA: Diagnosis present

## 2021-05-05 DIAGNOSIS — Z5329 Procedure and treatment not carried out because of patient's decision for other reasons: Secondary | ICD-10-CM | POA: Diagnosis present

## 2021-05-05 DIAGNOSIS — F1020 Alcohol dependence, uncomplicated: Secondary | ICD-10-CM | POA: Diagnosis present

## 2021-05-05 DIAGNOSIS — K529 Noninfective gastroenteritis and colitis, unspecified: Secondary | ICD-10-CM | POA: Diagnosis present

## 2021-05-05 DIAGNOSIS — D62 Acute posthemorrhagic anemia: Secondary | ICD-10-CM | POA: Diagnosis present

## 2021-05-05 DIAGNOSIS — Z20822 Contact with and (suspected) exposure to covid-19: Secondary | ICD-10-CM | POA: Diagnosis present

## 2021-05-05 DIAGNOSIS — Z79899 Other long term (current) drug therapy: Secondary | ICD-10-CM

## 2021-05-05 LAB — CBC WITH DIFFERENTIAL/PLATELET
Abs Immature Granulocytes: 0.01 10*3/uL (ref 0.00–0.07)
Basophils Absolute: 0 10*3/uL (ref 0.0–0.1)
Basophils Relative: 1 %
Eosinophils Absolute: 0 10*3/uL (ref 0.0–0.5)
Eosinophils Relative: 0 %
HCT: 24.7 % — ABNORMAL LOW (ref 39.0–52.0)
Hemoglobin: 7.3 g/dL — ABNORMAL LOW (ref 13.0–17.0)
Immature Granulocytes: 0 %
Lymphocytes Relative: 37 %
Lymphs Abs: 1.2 10*3/uL (ref 0.7–4.0)
MCH: 24.7 pg — ABNORMAL LOW (ref 26.0–34.0)
MCHC: 29.6 g/dL — ABNORMAL LOW (ref 30.0–36.0)
MCV: 83.7 fL (ref 80.0–100.0)
Monocytes Absolute: 0.3 10*3/uL (ref 0.1–1.0)
Monocytes Relative: 10 %
Neutro Abs: 1.7 10*3/uL (ref 1.7–7.7)
Neutrophils Relative %: 52 %
Platelets: 72 10*3/uL — ABNORMAL LOW (ref 150–400)
RBC: 2.95 MIL/uL — ABNORMAL LOW (ref 4.22–5.81)
RDW: 21.4 % — ABNORMAL HIGH (ref 11.5–15.5)
Smear Review: DECREASED
WBC: 3.3 10*3/uL — ABNORMAL LOW (ref 4.0–10.5)
nRBC: 0 % (ref 0.0–0.2)

## 2021-05-05 LAB — URINALYSIS, ROUTINE W REFLEX MICROSCOPIC
Bilirubin Urine: NEGATIVE
Glucose, UA: NEGATIVE mg/dL
Ketones, ur: NEGATIVE mg/dL
Leukocytes,Ua: NEGATIVE
Nitrite: NEGATIVE
Protein, ur: 100 mg/dL — AB
Specific Gravity, Urine: 1.011 (ref 1.005–1.030)
pH: 6 (ref 5.0–8.0)

## 2021-05-05 LAB — LIPASE, BLOOD: Lipase: 115 U/L — ABNORMAL HIGH (ref 11–51)

## 2021-05-05 LAB — COMPREHENSIVE METABOLIC PANEL
ALT: 46 U/L — ABNORMAL HIGH (ref 0–44)
AST: 148 U/L — ABNORMAL HIGH (ref 15–41)
Albumin: 3.7 g/dL (ref 3.5–5.0)
Alkaline Phosphatase: 88 U/L (ref 38–126)
Anion gap: 17 — ABNORMAL HIGH (ref 5–15)
BUN: 10 mg/dL (ref 6–20)
CO2: 22 mmol/L (ref 22–32)
Calcium: 8.8 mg/dL — ABNORMAL LOW (ref 8.9–10.3)
Chloride: 99 mmol/L (ref 98–111)
Creatinine, Ser: 0.81 mg/dL (ref 0.61–1.24)
GFR, Estimated: >60 mL/min (ref >60)
Glucose, Bld: 103 mg/dL — ABNORMAL HIGH (ref 70–99)
Potassium: 3.2 mmol/L — ABNORMAL LOW (ref 3.5–5.1)
Sodium: 138 mmol/L (ref 135–145)
Total Bilirubin: 0.7 mg/dL (ref 0.3–1.2)
Total Protein: 8.3 g/dL — ABNORMAL HIGH (ref 6.5–8.1)

## 2021-05-05 LAB — PROTIME-INR
INR: 1 (ref 0.8–1.2)
Prothrombin Time: 12.6 seconds (ref 11.4–15.2)

## 2021-05-05 NOTE — ED Triage Notes (Signed)
Vomiting blood x 1 week. 2 episodes in the last 24 hours. Daily alcohol use about a fifth of vodka.  ?

## 2021-05-05 NOTE — ED Provider Triage Note (Cosign Needed)
Emergency Medicine Provider Triage Evaluation Note ? ?Samuel Blevins , a 56 y.o. male  was evaluated in triage.  Pt complains of hematemesis.  Patient reports that he has been having hematemesis intermittently throughout the entire week.  Reports vomiting twice in the last 24 hours.  Reports seeing blood in his vomit.  Unable to specify the exact amount of blood he is seeing.  Patient complains of periumbilical abdominal pain. ? ?Patient denies any fever, chills, blood in stool, melena, dysuria, hematuria, urinary urgency, syncope. ? ?Endorses EtOH use.  States he drinks approximately fifth of alcohol daily.  Reports that he has had 1/5 of alcohol today. ? ?Review of Systems  ?Positive: Hematemesis, abdominal pain, nausea, vomiting ?Negative: See above ? ?Physical Exam  ?BP 137/88 (BP Location: Left Arm)   Pulse (!) 105   Temp 98 ?F (36.7 ?C) (Oral)   Resp 19   SpO2 100%  ?Gen:   Awake, no distress   ?Resp:  Normal effort  ?MSK:   Moves extremities without difficulty  ?Other:  Abdomen protuberant, soft, nondistended, nontender no guarding or rebound tenderness. ? ?Medical Decision Making  ?Medically screening exam initiated at 10:04 PM.  Appropriate orders placed.  Samuel Blevins was informed that the remainder of the evaluation will be completed by another provider, this initial triage assessment does not replace that evaluation, and the importance of remaining in the ED until their evaluation is complete. ? ? ?  ?Samuel Schroeder, PA-C ?05/05/21 2206 ? ?

## 2021-05-05 NOTE — ED Notes (Signed)
Patient spitting on the floor. Given emesis bag and instructed to throw up in the bag ?

## 2021-05-06 ENCOUNTER — Observation Stay (HOSPITAL_COMMUNITY)
Admission: EM | Admit: 2021-05-06 | Discharge: 2021-05-07 | Discharge status: Against Medical Advice | Payer: 59 | Attending: Internal Medicine | Admitting: Internal Medicine

## 2021-05-06 ENCOUNTER — Emergency Department (HOSPITAL_COMMUNITY): Payer: Self-pay

## 2021-05-06 DIAGNOSIS — I10 Essential (primary) hypertension: Secondary | ICD-10-CM | POA: Insufficient documentation

## 2021-05-06 DIAGNOSIS — F1010 Alcohol abuse, uncomplicated: Secondary | ICD-10-CM | POA: Diagnosis not present

## 2021-05-06 DIAGNOSIS — F10929 Alcohol use, unspecified with intoxication, unspecified: Secondary | ICD-10-CM

## 2021-05-06 DIAGNOSIS — F10129 Alcohol abuse with intoxication, unspecified: Secondary | ICD-10-CM | POA: Insufficient documentation

## 2021-05-06 DIAGNOSIS — Z87891 Personal history of nicotine dependence: Secondary | ICD-10-CM | POA: Insufficient documentation

## 2021-05-06 DIAGNOSIS — Z720 Tobacco use: Secondary | ICD-10-CM | POA: Diagnosis not present

## 2021-05-06 DIAGNOSIS — K922 Gastrointestinal hemorrhage, unspecified: Secondary | ICD-10-CM

## 2021-05-06 DIAGNOSIS — Y908 Blood alcohol level of 240 mg/100 ml or more: Secondary | ICD-10-CM | POA: Insufficient documentation

## 2021-05-06 DIAGNOSIS — D649 Anemia, unspecified: Secondary | ICD-10-CM | POA: Diagnosis not present

## 2021-05-06 DIAGNOSIS — R748 Abnormal levels of other serum enzymes: Secondary | ICD-10-CM | POA: Insufficient documentation

## 2021-05-06 LAB — CBC WITH DIFFERENTIAL/PLATELET
Abs Immature Granulocytes: 0.01 10*3/uL (ref 0.00–0.07)
Abs Immature Granulocytes: 0.01 10*3/uL (ref 0.00–0.07)
Basophils Absolute: 0 10*3/uL (ref 0.0–0.1)
Basophils Absolute: 0 10*3/uL (ref 0.0–0.1)
Basophils Relative: 1 %
Basophils Relative: 1 %
Eosinophils Absolute: 0 10*3/uL (ref 0.0–0.5)
Eosinophils Absolute: 0 10*3/uL (ref 0.0–0.5)
Eosinophils Relative: 1 %
Eosinophils Relative: 1 %
HCT: 23.1 % — ABNORMAL LOW (ref 39.0–52.0)
HCT: 25 % — ABNORMAL LOW (ref 39.0–52.0)
Hemoglobin: 7.1 g/dL — ABNORMAL LOW (ref 13.0–17.0)
Hemoglobin: 7.4 g/dL — ABNORMAL LOW (ref 13.0–17.0)
Immature Granulocytes: 1 %
Immature Granulocytes: 0 %
Lymphocytes Relative: 51 %
Lymphocytes Relative: 42 %
Lymphs Abs: 1.1 10*3/uL (ref 0.7–4.0)
Lymphs Abs: 1.1 10*3/uL (ref 0.7–4.0)
MCH: 25.3 pg — ABNORMAL LOW (ref 26.0–34.0)
MCH: 24.7 pg — ABNORMAL LOW (ref 26.0–34.0)
MCHC: 30.7 g/dL (ref 30.0–36.0)
MCHC: 29.6 g/dL — ABNORMAL LOW (ref 30.0–36.0)
MCV: 82.2 fL (ref 80.0–100.0)
MCV: 83.3 fL (ref 80.0–100.0)
Monocytes Absolute: 0.2 10*3/uL (ref 0.1–1.0)
Monocytes Absolute: 0.3 10*3/uL (ref 0.1–1.0)
Monocytes Relative: 10 %
Monocytes Relative: 12 %
Neutro Abs: 0.7 10*3/uL — ABNORMAL LOW (ref 1.7–7.7)
Neutro Abs: 1.2 10*3/uL — ABNORMAL LOW (ref 1.7–7.7)
Neutrophils Relative %: 36 %
Neutrophils Relative %: 44 %
Platelets: 57 10*3/uL — ABNORMAL LOW (ref 150–400)
Platelets: 60 10*3/uL — ABNORMAL LOW (ref 150–400)
RBC: 2.81 MIL/uL — ABNORMAL LOW (ref 4.22–5.81)
RBC: 3 MIL/uL — ABNORMAL LOW (ref 4.22–5.81)
RDW: 21.3 % — ABNORMAL HIGH (ref 11.5–15.5)
RDW: 21.2 % — ABNORMAL HIGH (ref 11.5–15.5)
Smear Review: DECREASED
Smear Review: DECREASED
WBC: 2 10*3/uL — ABNORMAL LOW (ref 4.0–10.5)
WBC: 2.7 10*3/uL — ABNORMAL LOW (ref 4.0–10.5)
nRBC: 0 % (ref 0.0–0.2)
nRBC: 0 % (ref 0.0–0.2)

## 2021-05-06 LAB — RAPID URINE DRUG SCREEN, HOSP PERFORMED
Amphetamines: NOT DETECTED
Barbiturates: NOT DETECTED
Benzodiazepines: NOT DETECTED
Cocaine: NOT DETECTED
Opiates: NOT DETECTED
Tetrahydrocannabinol: NOT DETECTED

## 2021-05-06 LAB — COMPREHENSIVE METABOLIC PANEL
ALT: 41 U/L (ref 0–44)
ALT: 41 U/L (ref 0–44)
AST: 124 U/L — ABNORMAL HIGH (ref 15–41)
AST: 108 U/L — ABNORMAL HIGH (ref 15–41)
Albumin: 3.3 g/dL — ABNORMAL LOW (ref 3.5–5.0)
Albumin: 3.7 g/dL (ref 3.5–5.0)
Alkaline Phosphatase: 79 U/L (ref 38–126)
Alkaline Phosphatase: 88 U/L (ref 38–126)
Anion gap: 15 (ref 5–15)
Anion gap: 14 (ref 5–15)
BUN: 8 mg/dL (ref 6–20)
BUN: 8 mg/dL (ref 6–20)
CO2: 22 mmol/L (ref 22–32)
CO2: 23 mmol/L (ref 22–32)
Calcium: 8.2 mg/dL — ABNORMAL LOW (ref 8.9–10.3)
Calcium: 8.7 mg/dL — ABNORMAL LOW (ref 8.9–10.3)
Chloride: 100 mmol/L (ref 98–111)
Chloride: 101 mmol/L (ref 98–111)
Creatinine, Ser: 0.67 mg/dL (ref 0.61–1.24)
Creatinine, Ser: 0.77 mg/dL (ref 0.61–1.24)
GFR, Estimated: >60 mL/min (ref >60)
GFR, Estimated: >60 mL/min (ref >60)
Glucose, Bld: 78 mg/dL (ref 70–99)
Glucose, Bld: 96 mg/dL (ref 70–99)
Potassium: 3.3 mmol/L — ABNORMAL LOW (ref 3.5–5.1)
Potassium: 3.4 mmol/L — ABNORMAL LOW (ref 3.5–5.1)
Sodium: 137 mmol/L (ref 135–145)
Sodium: 138 mmol/L (ref 135–145)
Total Bilirubin: 0.5 mg/dL (ref 0.3–1.2)
Total Bilirubin: 0.6 mg/dL (ref 0.3–1.2)
Total Protein: 7.3 g/dL (ref 6.5–8.1)
Total Protein: 8.2 g/dL — ABNORMAL HIGH (ref 6.5–8.1)

## 2021-05-06 LAB — LIPASE, BLOOD: Lipase: 142 U/L — ABNORMAL HIGH (ref 11–51)

## 2021-05-06 LAB — RESP PANEL BY RT-PCR (FLU A&B, COVID) ARPGX2
Influenza A by PCR: NEGATIVE
Influenza B by PCR: NEGATIVE
SARS Coronavirus 2 by RT PCR: NEGATIVE

## 2021-05-06 LAB — PROTIME-INR
INR: 1 (ref 0.8–1.2)
Prothrombin Time: 12.8 seconds (ref 11.4–15.2)

## 2021-05-06 LAB — POC OCCULT BLOOD, ED: Fecal Occult Bld: NEGATIVE

## 2021-05-06 LAB — MAGNESIUM: Magnesium: 1.5 mg/dL — ABNORMAL LOW (ref 1.7–2.4)

## 2021-05-06 LAB — ETHANOL: Alcohol, Ethyl (B): 421 mg/dL (ref <10)

## 2021-05-06 MED ORDER — ACETAMINOPHEN 650 MG RE SUPP: 650.0000 mg | Freq: Four times a day (QID) | RECTAL | Status: DC | PRN

## 2021-05-06 MED ORDER — M.V.I. ADULT IV INJ
INJECTION | Freq: Once | INTRAVENOUS | Status: AC
  Filled 2021-05-06: qty 1000

## 2021-05-06 MED ORDER — PANTOPRAZOLE INFUSION (NEW) - SIMPLE MED
8.0000 mg/h | INTRAVENOUS | Status: DC
Start: 2021-05-06 — End: 2021-05-06
  Administered 2021-05-06: 8 mg/h via INTRAVENOUS
  Filled 2021-05-06: qty 80

## 2021-05-06 MED ORDER — ONDANSETRON HCL 4 MG/2ML IJ SOLN
4.0000 mg | Freq: Four times a day (QID) | INTRAMUSCULAR | Status: DC | PRN
Start: 2021-05-06 — End: 2021-05-06
  Administered 2021-05-06: 4 mg via INTRAVENOUS
  Filled 2021-05-06: qty 2

## 2021-05-06 MED ORDER — IOHEXOL 300 MG/ML  SOLN
100.0000 mL | Freq: Once | INTRAMUSCULAR | Status: AC | PRN
  Administered 2021-05-06: 100 mL via INTRAVENOUS

## 2021-05-06 MED ORDER — LORAZEPAM 2 MG/ML IJ SOLN: 0.0000 mg | INTRAMUSCULAR | Status: DC

## 2021-05-06 MED ORDER — LACTATED RINGERS IV BOLUS
1000.0000 mL | Freq: Once | INTRAVENOUS | Status: AC
  Administered 2021-05-06: 1000 mL via INTRAVENOUS

## 2021-05-06 MED ORDER — PANTOPRAZOLE SODIUM 40 MG IV SOLR: 40.0000 mg | Freq: Two times a day (BID) | INTRAVENOUS | Status: DC

## 2021-05-06 MED ORDER — PANTOPRAZOLE INFUSION (NEW) - SIMPLE MED
8.0000 mg/h | INTRAVENOUS | Status: DC
Start: 2021-05-07 — End: 2021-05-07
  Administered 2021-05-07: 8 mg/h via INTRAVENOUS
  Filled 2021-05-06: qty 80

## 2021-05-06 MED ORDER — ACETAMINOPHEN 325 MG PO TABS
650.0000 mg | ORAL_TABLET | Freq: Four times a day (QID) | ORAL | Status: DC | PRN
  Administered 2021-05-07: 650 mg via ORAL
  Filled 2021-05-06: qty 2

## 2021-05-06 MED ORDER — PANTOPRAZOLE SODIUM 40 MG IV SOLR
40.0000 mg | Freq: Once | INTRAVENOUS | Status: AC
  Administered 2021-05-06: 40 mg via INTRAVENOUS
  Filled 2021-05-06: qty 10

## 2021-05-06 MED ORDER — ONDANSETRON HCL 4 MG/2ML IJ SOLN
4.0000 mg | Freq: Once | INTRAMUSCULAR | Status: AC
  Administered 2021-05-06: 4 mg via INTRAVENOUS
  Filled 2021-05-06: qty 2

## 2021-05-06 MED ORDER — SODIUM CHLORIDE 0.9 % IV SOLN: 1000.0000 mL | INTRAVENOUS | Status: DC

## 2021-05-06 MED ORDER — MORPHINE SULFATE (PF) 2 MG/ML IV SOLN
2.0000 mg | INTRAVENOUS | Status: DC | PRN
  Administered 2021-05-06: 2 mg via INTRAVENOUS
  Filled 2021-05-06: qty 1

## 2021-05-06 MED ORDER — FOLIC ACID 1 MG PO TABS
1.0000 mg | ORAL_TABLET | Freq: Every day | ORAL | Status: DC
  Administered 2021-05-06: 1 mg via ORAL
  Filled 2021-05-06: qty 1

## 2021-05-06 MED ORDER — SODIUM CHLORIDE 0.9 % IV BOLUS (SEPSIS)
1000.0000 mL | Freq: Once | INTRAVENOUS | Status: AC
  Administered 2021-05-06: 1000 mL via INTRAVENOUS

## 2021-05-06 MED ORDER — THIAMINE HCL 100 MG/ML IJ SOLN: 100.0000 mg | Freq: Every day | INTRAMUSCULAR | Status: DC

## 2021-05-06 MED ORDER — LORAZEPAM 1 MG PO TABS: 1.0000 mg | ORAL_TABLET | ORAL | Status: DC | PRN

## 2021-05-06 MED ORDER — LORAZEPAM 2 MG/ML IJ SOLN: 1.0000 mg | INTRAMUSCULAR | Status: DC | PRN

## 2021-05-06 MED ORDER — LORAZEPAM 2 MG/ML IJ SOLN: 0.0000 mg | Freq: Three times a day (TID) | INTRAMUSCULAR | Status: DC

## 2021-05-06 MED ORDER — ACETAMINOPHEN 325 MG PO TABS: 650.0000 mg | ORAL_TABLET | Freq: Four times a day (QID) | ORAL | Status: DC | PRN

## 2021-05-06 MED ORDER — NICOTINE 14 MG/24HR TD PT24
14.0000 mg | MEDICATED_PATCH | Freq: Every day | TRANSDERMAL | Status: DC
  Administered 2021-05-06: 14 mg via TRANSDERMAL
  Filled 2021-05-06: qty 1

## 2021-05-06 MED ORDER — HYDRALAZINE HCL 20 MG/ML IJ SOLN: 5.0000 mg | INTRAMUSCULAR | Status: DC | PRN

## 2021-05-06 MED ORDER — SODIUM CHLORIDE 0.9% FLUSH: 3.0000 mL | Freq: Two times a day (BID) | INTRAVENOUS | Status: DC

## 2021-05-06 MED ORDER — THIAMINE HCL 100 MG PO TABS
100.0000 mg | ORAL_TABLET | Freq: Every day | ORAL | Status: DC
  Administered 2021-05-06: 100 mg via ORAL
  Filled 2021-05-06: qty 1

## 2021-05-06 MED ORDER — LACTATED RINGERS IV SOLN: INTRAVENOUS | Status: DC

## 2021-05-06 MED ORDER — PANTOPRAZOLE SODIUM 40 MG IV SOLR
40.0000 mg | Freq: Once | INTRAVENOUS | Status: AC
Start: 2021-05-06 — End: 2021-05-06
  Administered 2021-05-06: 40 mg via INTRAVENOUS
  Filled 2021-05-06: qty 10

## 2021-05-06 MED ORDER — DIAZEPAM 5 MG PO TABS
5.0000 mg | ORAL_TABLET | Freq: Four times a day (QID) | ORAL | Status: DC
Start: 2021-05-06 — End: 2021-05-06
  Administered 2021-05-06: 5 mg via ORAL
  Filled 2021-05-06: qty 1

## 2021-05-06 MED ORDER — ADULT MULTIVITAMIN W/MINERALS CH
1.0000 | ORAL_TABLET | Freq: Every day | ORAL | Status: DC
  Administered 2021-05-06: 1 via ORAL
  Filled 2021-05-06: qty 1

## 2021-05-06 MED ORDER — ONDANSETRON HCL 4 MG PO TABS: 4.0000 mg | ORAL_TABLET | Freq: Four times a day (QID) | ORAL | Status: DC | PRN

## 2021-05-06 NOTE — Consult Note (Addendum)
Consultation Note   Referring Provider: [facility] PCP: Pcp, No Primary Gastroenterologist: Doristine LocksVito Cirigliano, MD Reason for consultation: reported hematemesis, chronic dark stool                          Assessment and Plan   # 56 yo male with history of IDA, recurrent hematemesis and dark stools ( has had previous admissions for same). He hasn't ever required blood transfusion based on Epic records. He does have a history of H. Pylori related gastritis in 2021, questionable if completed antibiotics but negative stain on last EGD with biopsies in October 2022.  Also has history of duodenal AVMs.  -- Baseline hgb is 7.5 - 8.5, currently at 7.1. Normal BUN. Hemodynamically stable.  --Will repeat iron studies.  --May need repeat EGD this admission. He will need a colonoscopy at some point ( probably outpatient) --Asking for food, can have clear liquids today --Iron on home med list but he doesn't take it --Continue PPI infusion for now   # Pancytopenia, slightly worse than prior admisisons.  Probably bone marrow suppression from Etoh use. No evidence for cirrhosis on CT scan. Normal INR  # Abnormal liver chemistries/ ongoing Etoh ( liquor everyday). AST to ALT ratio suggested of Etoh. 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 Will need outpatient follow up.   # History of recurrent pancreatitis in 2021, 2022 ( probable Etoh related).  No pancreatitis on current CT scan.   # Additional medical history listed below.    History of Present Illness:   Patient Profile:  Samuel Blevins is a 56 y.o. male known to Dr. Barron Alvineirigliano with a past medical history significant for ETOH dependence, tobacco abuse, HTN, recurrent pancreatitis, gastritis / H.pylori in 2021, duodenal AVMs, and homelessness   See PMH  for any additional medical problems.    Patient presented to ED yesterday for evaluation of bloody emesis. He has a history of Etoh abuse, continues to drink. He was admitted for anemia and dark stools in April 2021, July 2022, and Oct 2022 with reported GI bleeding. In ED is vital signs were stable as well as pancreatitis on some of those occasions.   Molly MaduroRobert says he had has multiple episodes of dark emesis over the last week. He describes black stool for months. Takes pepto sometimes. He is not taking iron. He doesn't take Nsaids and not on a PPI. He gives a two day history of periumbilical discomfort, better with defecation. No other GI complaints   Diagnostic Studies This Admission   Imaging:    12/07/20 CTAP w contrast: Groove pancreatitis. No peripancreatic fluid collections. Small foci of hypoattenuation within the pancreatic head may represent areas of interstitial edema or tiny foci of parenchymal necrosis but are not well characterized at this time. This can be reassessed on subsequent imaging.  Severe hepatic steatosis and mild hepatomegaly, unchanged.  Aortic Atherosclerosis.  CT Abdomen Pelvis W Contrast  Result Date: 05/06/2021 CLINICAL DATA:  Nausea, vomiting and abdominal pain with previous history of pancreatitis. Hematemesis x1 week with 2 episodes in the past 24 hours.  EXAM: CT ABDOMEN AND PELVIS WITH CONTRAST TECHNIQUE: Multidetector CT imaging of the abdomen and pelvis was performed using the standard protocol following bolus administration of intravenous contrast. RADIATION DOSE REDUCTION: This exam was performed according to the departmental dose-optimization program which includes automated exposure control, adjustment of the mA and/or kV according to patient size and/or use of iterative reconstruction technique. CONTRAST:  OMNIPAQUE IOHEXOL 300 MG/ML  SOLN COMPARISON:  CTs with IV contrast 12/07/2020 and 09/16/2020. FINDINGS: Lower chest: There is respiratory motion on  exam. Asymmetric haziness in the right lower lobe is seen and could be breathing motion artifact, asymmetric microatelectasis, or pneumonitis. Lung bases are otherwise clear.  The cardiac size is normal. Hepatobiliary: 20.3 cm length liver with moderate to severe steatosis. No portal vein dilatation. Gallbladder and bile ducts unremarkable. 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 Areas of focal edema in the head and uncinate processes of the pancreas on the previous exam are also no longer seen. There is no mass enhancement or ductal dilatation. Spleen: Normal in size with uniform enhancement. Adrenals/Urinary Tract: There is no adrenal mass or focal abnormality of the renal cortex. There is no evidence of urinary stones or obstruction. The bladder is mildly thickened but this was also seen on prior studies. Stomach/Bowel: The gastric wall is moderately thickened but no more than previously. Similar moderate thickening of the descending duodenum as well. There are mild thickened folds in some of the left abdominal small bowel but there is no small bowel obstruction or inflammation. Appendix is not seen in this patient. There is mild wall prominence versus underdistention of the ascending colon. There are colonic diverticula without evidence of focal inflammation. Vascular/Lymphatic: Moderate aortoiliac calcific plaque. No AAA. No adenopathy. Reproductive: Mildly enlarged prostate impressing on the bladder base. Other: Prior umbilical hernia repair, without recurrence. No free air, hemorrhage or free fluid. Musculoskeletal: Advanced degenerative disc disease, spondylosis and reactive endplate sclerosis again noted L5-S1 with disc osteophyte complex encroaching on both S1 nerve roots with a least mild S1 nerve root compression. Severe bilateral foraminal stenosis. Lesser degenerative disc disease L4-5. Bilateral SI joint  ankylosis anteriorly. 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 IMPRESSION: 1. Imaging findings of gastroenteritis. No bowel obstruction or mesenteric inflammation. 2. Prior peripancreatic edematous findings are not seen today. 3. Moderate to severe hepatic steatosis. 4. Ascending colitis versus nondistention.  Diverticulosis. 5. Prominent prostate impressing on the bladder base with mild bladder thickening, likely hypertrophy and unchanged. 6. Haziness in the right lower lobe, which could be due to respiratory motion artifact, microatelectasis or pneumonitis. 7. Aortic atherosclerosis. Electronically Signed   By: Almira Bar M.D.   On: 05/06/2021 05:19   DG Chest Port 1 View  Result Date: 05/06/2021 CLINICAL DATA:  Chest pain. EXAM: PORTABLE CHEST 1 VIEW COMPARISON:  Chest radiograph dated 11/22/2015. FINDINGS: No focal consolidation, pleural effusion, or pneumothorax. The cardiac silhouette is within normal limits. Atherosclerotic calcification of the aorta. No acute osseous pathology. IMPRESSION: No active disease. Electronically Signed   By: Elgie Collard M.D.   On: 05/06/2021 04:13       Recent Labs    05/05/21 2216 05/06/21 0648  WBC 3.3* 2.0*  HGB 7.3* 7.1*  HCT 24.7* 23.1*  PLT 72* 57*   Recent Labs    05/05/21 2216 05/06/21 0648  NA 138 137  K 3.2* 3.3*  CL 99 100  CO2 22 22  GLUCOSE 103* 78  BUN 10 8  CREATININE 0.81 0.67  CALCIUM 8.8* 8.2*   Recent Labs    05/06/21 0648  PROT 7.3  ALBUMIN 3.3*  AST 124*  ALT 41  ALKPHOS 79  BILITOT 0.5   No results for input(s): HEPBSAG, HCVAB, HEPAIGM, HEPBIGM in the last 72 hours. Recent Labs    05/05/21 2216 05/06/21 0648  LABPROT 12.6 12.8  INR 1.0 1.0    Previous GI Evaluations   April 2021 EGD --Gastritis. Biopsied. 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 Treated with argon plasma coagulation (APC). - Mucosal changes in the duodenum.  Biopsied. FINAL MICROSCOPIC DIAGNOSIS:   A. DUODENUM, BIOPSY:  - Duodenal mucosa with peptic injury.  - No adenomatous change or carcinoma.   B. STOMACH, BIOPSY:  - Mildly active Helicobacter pylori gastritis.  - Warthin-Starry shows rare organisms consistent with Helicobacter  pylori.  - No intestinal metaplasia, dysplasia or carcinoma.  FINAL MICROSCOPIC DIAGNOSIS:    October 2022 EGD --Mild gastritis - Single duodenal erosion without bleeding. No UGI bleeding - The examination was otherwise normal. A. STOMACH, BODY/ANTRUM, BIOPSY:  - Gastric antral and oxyntic mucosa with mild chronic gastritis  - Warthin Starry stain is negative for Helicobacter pylori   Past Medical History:  Diagnosis Date   Alcohol withdrawal syndrome with complication (HCC)    Alcoholism (HCC)    Elevated AST (SGOT)    Homeless    Hypertension    Symptomatic anemia 11/23/2015    Past Surgical History:  Procedure Laterality Date   BIOPSY  06/23/2019   Procedure: BIOPSY;  Surgeon: Shellia Cleverly, DO;  Location: [facility] ENDOSCOPY;  Service: Gastroenterology;;   BIOPSY  12/12/2020   Procedure: BIOPSY;  Surgeon: Lynann Bologna, MD;  Location: [facility] ENDOSCOPY;  Service: Endoscopy;;   ESOPHAGOGASTRODUODENOSCOPY (EGD) WITH PROPOFOL N/A 06/23/2019   Procedure: ESOPHAGOGASTRODUODENOSCOPY (EGD) WITH PROPOFOL;  Surgeon: Shellia Cleverly, DO;  Location: [facility] ENDOSCOPY;  Service: Gastroenterology;  Laterality: N/A;   ESOPHAGOGASTRODUODENOSCOPY (EGD) WITH PROPOFOL N/A 12/12/2020   Procedure: ESOPHAGOGASTRODUODENOSCOPY (EGD) WITH PROPOFOL;  Surgeon: Lynann Bologna, MD;  Location: [facility] ENDOSCOPY;  Service: Endoscopy;  Laterality: N/A;   HOT HEMOSTASIS N/A 06/23/2019   Procedure: HOT HEMOSTASIS (ARGON PLASMA COAGULATION/BICAP);  Surgeon: Shellia Cleverly, DO;  Location: [facility] ENDOSCOPY;  Service: Gastroenterology;  Laterality: N/A;    Family History  Problem Relation Age of Onset   Diabetes Mellitus II Neg Hx    Colon  cancer Neg Hx    Stomach cancer Neg Hx    Pancreatic cancer Neg Hx     Prior to Admission medications   Medication Sig Start Date End Date Taking? Authorizing Provider  acetaminophen (TYLENOL) 325 MG tablet Take 2 tablets (650 mg total) by mouth every 6 (six) hours as needed for fever, moderate pain, mild pain or headache. 12/12/20   Belva Agee, MD  Ferrous Gluconate 239 (27 Fe) MG TABS Take 1 tablet by mouth every Monday, Wednesday, and Friday. 12/13/20   Katsadouros, Vasilios, MD  folic acid (FOLVITE) 1 MG tablet Take 1 tablet (1 mg total) by mouth daily. 12/13/20   Katsadouros, Vasilios, MD  lipase/protease/amylase (CREON) 36000 UNITS CPEP capsule Take 1 capsule (36,000 Units total) by mouth 3 (three) times daily before meals. 12/12/20   Katsadouros, Vasilios, MD  mineral oil-hydrophilic petrolatum (AQUAPHOR) ointment Apply 1 application topically 2 (two) times daily. Apply to both nostrils twice a day 12/12/20   Belva Agee, MD  Multiple Vitamin (MULTIVITAMIN WITH MINERALS)  TABS tablet Take 1 tablet by mouth daily. 12/13/20   Katsadouros, Vasilios, MD  oxymetazoline (AFRIN) 0.05 % nasal spray Place 3 sprays into both nostrils as needed (Bleeding). 12/12/20   Belva Agee, MD  pantoprazole (PROTONIX) 40 MG tablet Take 1 tablet (40 mg total) by mouth daily at 6 (six) AM. 12/13/20   Katsadouros, Vasilios, MD  sodium chloride (OCEAN) 0.65 % SOLN nasal spray Place 2 sprays into both nostrils every 4 (four) hours while awake. 12/12/20   Katsadouros, Vasilios, MD  sucralfate (CARAFATE) 1 g tablet Take 1 tablet (1 g total) by mouth 4 (four) times daily -  with meals and at bedtime. 01/06/21   Geoffery Lyons, MD  thiamine 100 MG tablet Take 1 tablet (100 mg total) by mouth daily. 12/13/20   Belva Agee, MD  thiamine 100 MG tablet Take 1 tablet (100 mg total) by mouth daily. 12/12/20   Belva Agee, MD    Current Facility-Administered Medications   Medication Dose Route Frequency Provider Last Rate Last Admin   acetaminophen (TYLENOL) tablet 650 mg  650 mg Oral Q6H PRN Jonah Blue, MD       Or   acetaminophen (TYLENOL) suppository 650 mg  650 mg Rectal Q6H PRN Jonah Blue, MD       diazepam (VALIUM) tablet 5 mg  5 mg Oral Q6H Jonah Blue, MD   5 mg at 05/06/21 7793   folic acid (FOLVITE) tablet 1 mg  1 mg Oral Daily Jonah Blue, MD   1 mg at 05/06/21 9030   hydrALAZINE (APRESOLINE) injection 5 mg  5 mg Intravenous Q4H PRN Jonah Blue, MD       lactated ringers infusion   Intravenous Continuous Jonah Blue, MD 100 mL/hr at 05/06/21 0920 New Bag at 05/06/21 0920   LORazepam (ATIVAN) injection 0-4 mg  0-4 mg Intravenous Vernell Leep, MD       Followed by   Melene Muller ON 05/08/2021] LORazepam (ATIVAN) injection 0-4 mg  0-4 mg Intravenous Bobette Mo, MD       LORazepam (ATIVAN) tablet 1-4 mg  1-4 mg Oral Q1H PRN Jonah Blue, MD       Or   LORazepam (ATIVAN) injection 1-4 mg  1-4 mg Intravenous Q1H PRN Jonah Blue, MD       morphine (PF) 2 MG/ML injection 2 mg  2 mg Intravenous Q2H PRN Jonah Blue, MD       multivitamin with minerals tablet 1 tablet  1 tablet Oral Daily Jonah Blue, MD   1 tablet at 05/06/21 0923   nicotine (NICODERM CQ - dosed in mg/24 hours) patch 14 mg  14 mg Transdermal Daily Jonah Blue, MD       ondansetron [facility]) tablet 4 mg  4 mg Oral Q6H PRN Jonah Blue, MD       Or   ondansetron [facility]) injection 4 mg  4 mg Intravenous Q6H PRN Jonah Blue, MD       [START ON 05/09/2021] pantoprazole (PROTONIX) injection 40 mg  40 mg Intravenous Steva Colder, MD       pantoprozole (PROTONIX) 80 mg /NS 100 mL infusion  8 mg/hr Intravenous Continuous Jonah Blue, MD 10 mL/hr at 05/06/21 0748 8 mg/hr at 05/06/21 0748   sodium chloride flush (NS) 0.9 % injection 3 mL  3 mL Intravenous Steva Colder, MD       thiamine tablet 100 mg  100 mg Oral Daily  Jonah Blue, MD   100 mg at  05/06/21 1610   Or   thiamine (B-1) injection 100 mg  100 mg Intravenous Daily Jonah Blue, MD       Current Outpatient Medications  Medication Sig Dispense Refill   acetaminophen (TYLENOL) 325 MG tablet Take 2 tablets (650 mg total) by mouth every 6 (six) hours as needed for fever, moderate pain, mild pain or headache. 30 tablet 0   Ferrous Gluconate 239 (27 Fe) MG TABS Take 1 tablet by mouth every Monday, Wednesday, and Friday. 30 tablet 0   folic acid (FOLVITE) 1 MG tablet Take 1 tablet (1 mg total) by mouth daily. 30 tablet 0   lipase/protease/amylase (CREON) 36000 UNITS CPEP capsule Take 1 capsule (36,000 Units total) by mouth 3 (three) times daily before meals. 180 capsule 3   mineral oil-hydrophilic petrolatum (AQUAPHOR) ointment Apply 1 application topically 2 (two) times daily. Apply to both nostrils twice a day 420 g 0   Multiple Vitamin (MULTIVITAMIN WITH MINERALS) TABS tablet Take 1 tablet by mouth daily. 30 tablet 0   oxymetazoline (AFRIN) 0.05 % nasal spray Place 3 sprays into both nostrils as needed (Bleeding). 30 mL 0   pantoprazole (PROTONIX) 40 MG tablet Take 1 tablet (40 mg total) by mouth daily at 6 (six) AM. 30 tablet 1   sodium chloride (OCEAN) 0.65 % SOLN nasal spray Place 2 sprays into both nostrils every 4 (four) hours while awake. 15 mL 2   sucralfate (CARAFATE) 1 g tablet Take 1 tablet (1 g total) by mouth 4 (four) times daily -  with meals and at bedtime. 40 tablet 0   thiamine 100 MG tablet Take 1 tablet (100 mg total) by mouth daily. 30 tablet 0   thiamine 100 MG tablet Take 1 tablet (100 mg total) by mouth daily. 30 tablet 0    Allergies as of 05/05/2021   (No Known Allergies)    Social History   Socioeconomic History   Marital status: Single    Spouse name: Not on file   Number of children: Not on file   Years of education: Not on file   Highest education level: Not on file  Occupational History   Not on file   Tobacco Use   Smoking status: Every Day    Packs/day: 1.00    Years: 30.00    Pack years: 30.00    Types: Cigarettes   Smokeless tobacco: Never  Vaping Use   Vaping Use: Never used  Substance and Sexual Activity   Alcohol use: Yes   Drug use: Never   Sexual activity: Not Currently  Other Topics Concern   Not on file  Social History Narrative   Not on file   Social Determinants of Health   Financial Resource Strain: Not on file  Food Insecurity: Not on file  Transportation Needs: Not on file  Physical Activity: Not on file  Stress: Not on file  Social Connections: Not on file  Intimate Partner Violence: Not on file    Review of Systems: All systems reviewed and negative except where noted in HPI.  Physical Exam: Vital signs in last 24 hours: Temp:  [98 F (36.7 C)-98.1 F (36.7 C)] 98.1 F (36.7 C) (03/12 0800) Pulse Rate:  [90-105] 96 (03/12 0921) Resp:  [11-19] 13 (03/12 0800) BP: (107-137)/(66-88) 127/68 (03/12 0921) SpO2:  [90 %-100 %] 94 % (03/12 0800) Weight:  [75.3 kg] 75.3 kg (03/11 2209)    General:  Alert male in NAD.  Psych:  Cooperative. t Eyes: Pupils  equal, no icterus. Conjunctive pink Ears:  Normal auditory acuity Nose: No deformity, discharge or lesions Neck:  Supple, no masses felt Lungs:  Clear to auscultation.  Heart:  Regular rate, regular rhythm. No lower extremity edema Abdomen:  Soft, nondistended, nontender, active bowel sounds, no masses felt Rectal :  Deferred Msk: Symmetrical without gross deformities.  Neurologic:  Alert, oriented, grossly normal neurologically Skin:  Intact without significant lesions.    Intake/Output from previous day: 03/11 0701 - 03/12 0700 In: 1000.4 [IV Piggyback:1000.4] Out: -  Intake/Output this shift:  No intake/output data recorded.   Principal Problem:   Acute upper GI bleed Active Problems:   Alcohol dependence syndrome (HCC)   Tobacco abuse   Essential hypertension    Willette Cluster, NP-C @  05/06/2021, 9:28 AM

## 2021-05-06 NOTE — ED Notes (Signed)
Phlebotomy and ED tech attempted to get blood cultures without success ?

## 2021-05-06 NOTE — Assessment & Plan Note (Signed)
-  He does not appear to be taking medications for this issue at this time °

## 2021-05-06 NOTE — H&P (Signed)
History and Physical    Patient: Samuel Blevins CBS:[id number] DOB: June 23, 1965 DOA: 05/05/2021 DOS: the patient was seen and examined on 05/06/2021 PCP: Pcp, No  Patient coming from: Homeless; NOK: Dutch Quint, [phone]   Chief Complaint: hematemesis  HPI: Samuel Blevins is a 56 y.o. male with medical history significant of ETOH dependence; HTN; and homelessness presenting with UGI bleeding.  He was similarly admitted in 11/2020 and EGD at that time showed mild gastritis and a single duodenal erosion without bleeding.  He reports that he has been having bloody emesis (black-red) about twice a day for a week.  He has been having periumbilical pain.  He continues to drink a fifth a day.  He wants to be full code but absolutely refuses blood products even at the risk of death.    ER Course:  Carryover, per Dr. Arlean Hopping:  Experienced an episode of vomiting associated with bright red blood earlier in the evening, but no additional since arrival in the emergency department.  He is reportedly otherwise asymptomatic.  Vital signs stable (HR's 90's, systolic blood pressures in the 120s mmHg).  BUN 10.  In the setting of chronic anemia with reported baseline hemoglobin around 8, presenting hemoglobin is 7.3.  DRE reportedly revealed brown-colored stool that was fecal occult blood negative.  CT abdomen/pelvis showed evidence of gastroenteritis.    Type and screen completed in the ED.  Reportedly refusing blood products at this time (not Jehovah's Witness).  Protonix 40 mg IV x1 administered.      Review of Systems: As mentioned in the history of present illness. All other systems reviewed and are negative. Past Medical History:  Diagnosis Date   Alcohol withdrawal syndrome with complication (HCC)    Alcoholism (HCC)    Elevated AST (SGOT)    Homeless    Hypertension    Symptomatic anemia 11/23/2015   Past Surgical History:  Procedure Laterality Date   BIOPSY  06/23/2019    Procedure: BIOPSY;  Surgeon: Shellia Cleverly, DO;  Location: [facility] ENDOSCOPY;  Service: Gastroenterology;;   BIOPSY  12/12/2020   Procedure: BIOPSY;  Surgeon: Lynann Bologna, MD;  Location: [facility] ENDOSCOPY;  Service: Endoscopy;;   ESOPHAGOGASTRODUODENOSCOPY (EGD) WITH PROPOFOL N/A 06/23/2019   Procedure: ESOPHAGOGASTRODUODENOSCOPY (EGD) WITH PROPOFOL;  Surgeon: Shellia Cleverly, DO;  Location: [facility] ENDOSCOPY;  Service: Gastroenterology;  Laterality: N/A;   ESOPHAGOGASTRODUODENOSCOPY (EGD) WITH PROPOFOL N/A 12/12/2020   Procedure: ESOPHAGOGASTRODUODENOSCOPY (EGD) WITH PROPOFOL;  Surgeon: Lynann Bologna, MD;  Location: [facility] ENDOSCOPY;  Service: Endoscopy;  Laterality: N/A;   HOT HEMOSTASIS N/A 06/23/2019   Procedure: HOT HEMOSTASIS (ARGON PLASMA COAGULATION/BICAP);  Surgeon: Shellia Cleverly, DO;  Location[facility] ENDOSCOPY;  Service: Gastroenterology;  Laterality: N/A;   Social History:  reports that he has been smoking. He has a 30.00 pack-year smoking history. He has never used smokeless tobacco. He reports current alcohol use. He reports that he does not use drugs.  No Known Allergies  Family History  Problem Relation Age of Onset   Diabetes Mellitus II Neg Hx    Colon cancer Neg Hx    Stomach cancer Neg Hx    Pancreatic cancer Neg Hx     Prior to Admission medications   Medication Sig Start Date End Date Taking? Authorizing Provider  acetaminophen (TYLENOL) 325 MG tablet Take 2 tablets (650 mg total) by mouth every 6 (six) hours as needed for fever, moderate pain, mild pain or headache. 12/12/20   Belva Agee, MD  Ferrous Gluconate 239 (27  Fe) MG TABS Take 1 tablet by mouth every Monday, Wednesday, and Friday. 12/13/20   Katsadouros, Vasilios, MD  folic acid (FOLVITE) 1 MG tablet Take 1 tablet (1 mg total) by mouth daily. 12/13/20   Katsadouros, Vasilios, MD  lipase/protease/amylase (CREON) 36000 UNITS CPEP capsule Take 1 capsule (36,000 Units total) by mouth 3 (three) times daily before  meals. 12/12/20   Katsadouros, Vasilios, MD  mineral oil-hydrophilic petrolatum (AQUAPHOR) ointment Apply 1 application topically 2 (two) times daily. Apply to both nostrils twice a day 12/12/20   Belva Agee, MD  Multiple Vitamin (MULTIVITAMIN WITH MINERALS) TABS tablet Take 1 tablet by mouth daily. 12/13/20   Katsadouros, Vasilios, MD  oxymetazoline (AFRIN) 0.05 % nasal spray Place 3 sprays into both nostrils as needed (Bleeding). 12/12/20   Belva Agee, MD  pantoprazole (PROTONIX) 40 MG tablet Take 1 tablet (40 mg total) by mouth daily at 6 (six) AM. 12/13/20   Katsadouros, Vasilios, MD  sodium chloride (OCEAN) 0.65 % SOLN nasal spray Place 2 sprays into both nostrils every 4 (four) hours while awake. 12/12/20   Katsadouros, Vasilios, MD  sucralfate (CARAFATE) 1 g tablet Take 1 tablet (1 g total) by mouth 4 (four) times daily -  with meals and at bedtime. 01/06/21   Geoffery Lyons, MD  thiamine 100 MG tablet Take 1 tablet (100 mg total) by mouth daily. 12/13/20   Belva Agee, MD  thiamine 100 MG tablet Take 1 tablet (100 mg total) by mouth daily. 12/12/20   Belva Agee, MD    Physical Exam: Vitals:   05/06/21 0430 05/06/21 0633 05/06/21 0715 05/06/21 0800  BP: 127/75 127/75 125/66 131/82  Pulse: 92 92  92  Resp: Temp:    98.1 F (36.7 C)  TempSrc:    Oral  SpO2: 94%   94%  Weight:      Height:       General:  Appears calm and comfortable and is in NAD Eyes:  PERRL, EOMI, normal lids, iris ENT:  grossly normal hearing, lips & tongue, mmm Neck:  no LAD, masses or thyromegaly Cardiovascular:  RRR, no m/r/g. No LE edema.  Respiratory:   CTA bilaterally with no wheezes/rales/rhonchi.  Normal respiratory effort. Abdomen:  soft, diffusely tender but not in midepigastric region, ND Back:   normal alignment, no CVAT Skin:  no rash or induration seen on limited exam Musculoskeletal:  grossly normal tone BUE/BLE, good ROM, no bony  abnormality Psychiatric:  cantankerous mood and affect, speech fluent and appropriate, AOx3 Neurologic:  CN 2-12 grossly intact, moves all extremities in coordinated fashion   Radiological Exams on Admission: Independently reviewed - see discussion in A/P where applicable  CT Abdomen Pelvis W Contrast  Result Date: 05/06/2021 CLINICAL DATA:  Nausea, vomiting and abdominal pain with previous history of pancreatitis. Hematemesis x1 week with 2 episodes in the past 24 hours. EXAM: CT ABDOMEN AND PELVIS WITH CONTRAST TECHNIQUE: Multidetector CT imaging of the abdomen and pelvis was performed using the standard protocol following bolus administration of intravenous contrast. RADIATION DOSE REDUCTION: This exam was performed according to the departmental dose-optimization program which includes automated exposure control, adjustment of the mA and/or kV according to patient size and/or use of iterative reconstruction technique. CONTRAST:  OMNIPAQUE IOHEXOL 300 MG/ML  SOLN COMPARISON:  CTs with IV contrast 12/07/2020 and 09/16/2020. FINDINGS: Lower chest: There is respiratory motion on exam. Asymmetric haziness in the right lower lobe is seen and could be breathing motion artifact,  asymmetric microatelectasis, or pneumonitis. Lung bases are otherwise clear.  The cardiac size is normal. Hepatobiliary: 20.3 cm length liver with moderate to severe steatosis. No portal vein dilatation. Gallbladder and bile ducts unremarkable. 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 Areas of focal edema in the head and uncinate processes of the pancreas on the previous exam are also no longer seen. There is no mass enhancement or ductal dilatation. Spleen: Normal in size with uniform enhancement. Adrenals/Urinary Tract: There is no adrenal mass or focal abnormality of the renal cortex. There is no evidence of urinary stones or  obstruction. The bladder is mildly thickened but this was also seen on prior studies. Stomach/Bowel: The gastric wall is moderately thickened but no more than previously. Similar moderate thickening of the descending duodenum as well. There are mild thickened folds in some of the left abdominal small bowel but there is no small bowel obstruction or inflammation. Appendix is not seen in this patient. There is mild wall prominence versus underdistention of the ascending colon. There are colonic diverticula without evidence of focal inflammation. Vascular/Lymphatic: Moderate aortoiliac calcific plaque. No AAA. No adenopathy. Reproductive: Mildly enlarged prostate impressing on the bladder base. Other: Prior umbilical hernia repair, without recurrence. No free air, hemorrhage or free fluid. Musculoskeletal: Advanced degenerative disc disease, spondylosis and reactive endplate sclerosis again noted L5-S1 with disc osteophyte complex encroaching on both S1 nerve roots with a least mild S1 nerve root compression. Severe bilateral foraminal stenosis. Lesser degenerative disc disease L4-5. Bilateral SI joint ankylosis anteriorly. 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 IMPRESSION: 1. Imaging findings of gastroenteritis. No bowel obstruction or mesenteric inflammation. 2. Prior peripancreatic edematous findings are not seen today. 3. Moderate to severe hepatic steatosis. 4. Ascending colitis versus nondistention.  Diverticulosis. 5. Prominent prostate impressing on the bladder base with mild bladder thickening, likely hypertrophy and unchanged. 6. Haziness in the right lower lobe, which could be due to respiratory motion artifact, microatelectasis or pneumonitis. 7. Aortic atherosclerosis. Electronically Signed   By: Almira Bar M.D.   On: 05/06/2021 05:19   DG Chest Port 1 View  Result Date: 05/06/2021 CLINICAL DATA:  Chest pain. EXAM: PORTABLE  CHEST 1 VIEW COMPARISON:  Chest radiograph dated 11/22/2015. FINDINGS: No focal consolidation, pleural effusion, or pneumothorax. The cardiac silhouette is within normal limits. Atherosclerotic calcification of the aorta. No acute osseous pathology. IMPRESSION: No active disease. Electronically Signed   By: Elgie Collard M.D.   On: 05/06/2021 04:13    EKG: pending   Labs on Admission: I have personally reviewed the available labs and imaging studies at the time of the admission.  Pertinent labs:    K+ 3.3 Mag++ 1.5 AST 124/ALT 41 WBC 2 Hgb 7.3 -> 7.1; 8.5 on 11/11 Platelets 57 INR 1.0 UA: small Hgb, 100 protein    Assessment and Plan: * Acute upper GI bleed -Patient is presenting with hematemesis, suggestive of upper GI bleeding. -Patient has history of gastritis, duodenal ulcer -Most likely diagnosis is gastric or duodenal ulcer, esophagitis or gastritis, or Mallory-Weiss tear. -The patient is not tachycardic with normal blood pressure, suggesting subacute volume loss.  -Will admit to progressive bed -GI consulted -NPO for possible EGD (he is not happy about this) -LR at 100 mL/hr -Start IV pantoprazole bolus and infusion given frank bleeding  -Zofran IV for nausea -Avoid NSAIDs and SQ heparin -Maintain IV access (2 large  bore IVs if possible). -Empiric Ceftriaxone should be given to those with Marjo Bickerhilds B or C cirrhosis who are presenting with GI bleeding; will hold Rocephin since he appears to be Child Class A -May consider Octreotide load and infusion, and beta blocker if patient continues to have hematemesis  ABLA -His Hgb decreased from 7.3 to 7.1 in the ER; prior was 8.5 in November.  -Type and screen ordered in case patient changes his mind regarding blood products.  -He clearly declines blood products at this time -Monitor closely and follow cbc q12h  Alcohol dependence syndrome (HCC) -Patient with chronic ETOH dependence -Number of drinks per day: a fifth of  liquor -BAL is pending -He is at high risk for complications of withdrawal including seizures, DTs -CIWA protocol -Folate, thiamine, and MVI ordered -Will provide fixed-dose (Valium 5 mg PO q6h) and also symptom-triggered BZD (Ativan per CIWA protocol)  -TOC team consult for substance abuse education -Will also check UDS. -Elevated LFTs are likely related to alcoholism -Consider offering a medication for Alcohol Use Disorder at the time of d/c, to include Disulfuram; Naltrexone; or Acamprosate.  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   -Patch ordered     Advance Care Planning:   Code Status: Full Code   Consults: GI; TOC team  DVT Prophylaxis: SCDs  Family Communication: None present; he initially wanted me to call his preacher but then changed his mind since it is Sunday and snowing  Severity of Illness: The appropriate patient status for this patient is INPATIENT. Inpatient status is judged to be reasonable and necessary in order to provide the required intensity of service to ensure the patient's safety. The patient's presenting symptoms, physical exam findings, and initial radiographic and laboratory data in the context of their chronic comorbidities is felt to place them at high risk for further clinical deterioration. Furthermore, it is not anticipated that the patient will be medically stable for discharge from the hospital within 2 midnights of admission.   * I certify that at the point of admission it is my clinical judgment that the patient will require inpatient hospital care spanning beyond 2 midnights from the point of admission due to high intensity of service, high risk for further deterioration and high frequency of surveillance required.*  Author: Jonah BlueJennifer Jaleia Hanke,  MD 05/06/2021 9:05 AM  For on call review [email or web address].

## 2021-05-06 NOTE — Progress Notes (Signed)
?  Carryover admission to the Day Admitter.  I discussed this case with the EDP, Dr. Madilyn Hook.  Per these discussions: ? ? ?This is a 56 year old male with history of chronic alcohol abuse, who is being admitted for acute upper GI bleed after presenting to [facility], ED complaining of an episode of hematemesis.  Experienced an episode of vomiting associated with bright red blood earlier in the evening, but no additional since arrival in the emergency department.  He is reportedly otherwise asymptomatic.  Vital signs stable (HR's 90's, systolic blood pressures in the 120s mmHg).  BUN 10.  In the setting of chronic anemia with reported baseline hemoglobin around 8, presenting hemoglobin is 7.3.  DRE reportedly revealed brown-colored stool that was fecal occult blood negative.  CT abdomen/pelvis showed evidence of gastroenteritis.  ? ?Type and screen completed in the ED.  Reportedly refusing blood products at this time (not Jehovah's Witness).  Protonix 40 mg IV x1 administered.  ? ?I have placed an order for observation to PCU for further evaluation and management of acute upper GI bleed.  ? ?I have placed some additional preliminary admit orders via the adult multi-morbid admission order set. I have also ordered initiation of Protonix drip, as well as a fresh set of labs to be drawn now, including CMP, CBC serum magnesium level, and INR. Npo. Case has not been discussed with GI at this time.  ? ? ? ?Samuel Pigg, DO ?Hospitalist  ?

## 2021-05-06 NOTE — Assessment & Plan Note (Signed)
-  Patient with chronic ETOH dependence ?-Number of drinks per day: a fifth of liquor ?-BAL is pending ?-He is at high risk for complications of withdrawal including seizures, DTs ?-CIWA protocol ?-Folate, thiamine, and MVI ordered ?-Will provide fixed-dose (Valium 5 mg PO q6h) and also symptom-triggered BZD (Ativan per CIWA protocol)  ?-TOC team consult for substance abuse education ?-Will also check UDS. ?-Elevated LFTs are likely related to alcoholism ?-Consider offering a medication for Alcohol Use Disorder at the time of d/c, to include Disulfuram; Naltrexone; or Acamprosate. ? ?Thrombocytopenia ?- Due to bone marrow suppression from alcohol abuse ?- Monitor daily platelet count  ?

## 2021-05-06 NOTE — ED Triage Notes (Signed)
Pt c/o vomiting blood x1wk, two times today. Pt drinks fifth of vodka/day, endorses same today.  ?

## 2021-05-06 NOTE — ED Provider Notes (Signed)
[facility] EMERGENCY DEPARTMENT Provider Note   CSN: [id number] Arrival date & time: 05/06/21  2024     History  Chief Complaint  Patient presents with   Hematemesis    Samuel Blevins is a 56 y.o. male.  HPI  Patient has history of hypertension, alcohol withdrawal syndrome, homelessness, GI bleeding.  Prior records reviewed and patient was recently admitted to the hospital for hematemesis.  Patient was actually admitted to the hospital yesterday and was discharged earlier today after leaving AMA.  Patient has a history of chronic alcohol use and recurrent bleeding.  Patient did have a CT scan that did not show any evidence of acute pancreatitis.  Medicine service was consulted regarding admission.  Patient was seen by the GI service.  Plan was for possible repeat EGD.  Patient ended up smoking cigarettes while he was in the room.  He was told he could not do that.  Patient ended up deciding to leave AMA and he was escorted out by security.  Patient admits to drinking alcohol since leaving.  He still is having hematemesis  Home Medications Prior to Admission medications   Medication Sig Start Date End Date Taking? Authorizing Provider  acetaminophen (TYLENOL) 325 MG tablet Take 2 tablets (650 mg total) by mouth every 6 (six) hours as needed for fever, moderate pain, mild pain or headache. Patient not taking: Reported on 05/06/2021 12/12/20   Belva Agee, MD  Ferrous Gluconate 239 (27 Fe) MG TABS Take 1 tablet by mouth every Monday, Wednesday, and Friday. Patient not taking: Reported on 05/06/2021 12/13/20   Belva Agee, MD  folic acid (FOLVITE) 1 MG tablet Take 1 tablet (1 mg total) by mouth daily. Patient not taking: Reported on 05/06/2021 12/13/20   Belva Agee, MD  lipase/protease/amylase (CREON) 36000 UNITS CPEP capsule Take 1 capsule (36,000 Units total) by mouth 3 (three) times daily before meals. Patient not taking: Reported on  05/06/2021 12/12/20   Belva Agee, MD  mineral oil-hydrophilic petrolatum (AQUAPHOR) ointment Apply 1 application topically 2 (two) times daily. Apply to both nostrils twice a day Patient not taking: Reported on 05/06/2021 12/12/20   Belva Agee, MD  Multiple Vitamin (MULTIVITAMIN WITH MINERALS) TABS tablet Take 1 tablet by mouth daily. Patient not taking: Reported on 05/06/2021 12/13/20   Belva Agee, MD  oxymetazoline (AFRIN) 0.05 % nasal spray Place 3 sprays into both nostrils as needed (Bleeding). Patient not taking: Reported on 05/06/2021 12/12/20   Belva Agee, MD  pantoprazole (PROTONIX) 40 MG tablet Take 1 tablet (40 mg total) by mouth daily at 6 (six) AM. Patient not taking: Reported on 05/06/2021 12/13/20   Belva Agee, MD  sodium chloride (OCEAN) 0.65 % SOLN nasal spray Place 2 sprays into both nostrils every 4 (four) hours while awake. Patient not taking: Reported on 05/06/2021 12/12/20   Belva Agee, MD  sucralfate (CARAFATE) 1 g tablet Take 1 tablet (1 g total) by mouth 4 (four) times daily -  with meals and at bedtime. Patient not taking: Reported on 05/06/2021 01/06/21   Geoffery Lyons, MD  thiamine 100 MG tablet Take 1 tablet (100 mg total) by mouth daily. Patient not taking: Reported on 05/06/2021 12/13/20   Belva Agee, MD  thiamine 100 MG tablet Take 1 tablet (100 mg total) by mouth daily. Patient not taking: Reported on 05/06/2021 12/12/20   Belva Agee, MD      Allergies    Patient has no known allergies.    Review of Systems  Review of Systems  Constitutional:  Negative for fever.   Physical Exam Updated Vital Signs BP 137/90    Pulse (!) 109    Temp 98.5 F (36.9 C) (Oral)    Resp 16    SpO2 98%  Physical Exam Vitals and nursing note reviewed.  Constitutional:      General: He is not in acute distress.    Appearance: He is well-developed.  HENT:     Head: Normocephalic and atraumatic.      Right Ear: External ear normal.     Left Ear: External ear normal.  Eyes:     General: No scleral icterus.       Right eye: No discharge.        Left eye: No discharge.     Conjunctiva/sclera: Conjunctivae normal.  Neck:     Trachea: No tracheal deviation.  Cardiovascular:     Rate and Rhythm: Normal rate and regular rhythm.  Pulmonary:     Effort: Pulmonary effort is normal. No respiratory distress.     Breath sounds: Normal breath sounds. No stridor. No wheezing or rales.  Abdominal:     General: Bowel sounds are normal. There is no distension.     Palpations: Abdomen is soft.     Tenderness: There is abdominal tenderness in the epigastric area. There is no guarding or rebound.  Genitourinary:    Comments: No gross blood noted on rectal exam Musculoskeletal:        General: No tenderness or deformity.     Cervical back: Neck supple.  Skin:    General: Skin is warm and dry.     Findings: No rash.  Neurological:     General: No focal deficit present.     Mental Status: He is alert.     Cranial Nerves: No cranial nerve deficit (no facial droop, extraocular movements intact, no slurred speech).     Sensory: No sensory deficit.     Motor: No abnormal muscle tone or seizure activity.     Coordination: Coordination normal.  Psychiatric:        Mood and Affect: Mood normal.    ED Results / Procedures / Treatments   Labs (all labs ordered are listed, but only abnormal results are displayed) Labs Reviewed  CBC WITH DIFFERENTIAL/PLATELET - Abnormal; Notable for the following components:      Result Value   WBC 2.7 (*)    RBC 3.00 (*)    Hemoglobin 7.4 (*)    HCT 25.0 (*)    MCH 24.7 (*)    MCHC 29.6 (*)    RDW 21.2 (*)    Platelets 60 (*)    Neutro Abs 1.2 (*)    All other components within normal limits  COMPREHENSIVE METABOLIC PANEL - Abnormal; Notable for the following components:   Potassium 3.4 (*)    Calcium 8.7 (*)    Total Protein 8.2 (*)    AST 108 (*)     All other components within normal limits  ETHANOL - Abnormal; Notable for the following components:   Alcohol, Ethyl (B) 421 (*)    All other components within normal limits  LIPASE, BLOOD - Abnormal; Notable for the following components:   Lipase 142 (*)    All other components within normal limits  MAGNESIUM  MAGNESIUM  COMPREHENSIVE METABOLIC PANEL  CBC WITH DIFFERENTIAL/PLATELET  PROTIME-INR  HEMOGLOBIN AND HEMATOCRIT, BLOOD  POC OCCULT BLOOD, ED    EKG None  Radiology CT Abdomen Pelvis  W Contrast  Result Date: 05/06/2021 CLINICAL DATA:  Nausea, vomiting and abdominal pain with previous history of pancreatitis. Hematemesis x1 week with 2 episodes in the past 24 hours. EXAM: CT ABDOMEN AND PELVIS WITH CONTRAST TECHNIQUE: Multidetector CT imaging of the abdomen and pelvis was performed using the standard protocol following bolus administration of intravenous contrast. RADIATION DOSE REDUCTION: This exam was performed according to the departmental dose-optimization program which includes automated exposure control, adjustment of the mA and/or kV according to patient size and/or use of iterative reconstruction technique. CONTRAST:  OMNIPAQUE IOHEXOL 300 MG/ML  SOLN COMPARISON:  CTs with IV contrast 12/07/2020 and 09/16/2020. FINDINGS: Lower chest: There is respiratory motion on exam. Asymmetric haziness in the right lower lobe is seen and could be breathing motion artifact, asymmetric microatelectasis, or pneumonitis. Lung bases are otherwise clear.  The cardiac size is normal. Hepatobiliary: 20.3 cm length liver with moderate to severe steatosis. No portal vein dilatation. Gallbladder and bile ducts unremarkable. 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 Areas of focal edema in the head and uncinate processes of the pancreas on the previous exam are also no longer seen. There is no mass  enhancement or ductal dilatation. Spleen: Normal in size with uniform enhancement. Adrenals/Urinary Tract: There is no adrenal mass or focal abnormality of the renal cortex. There is no evidence of urinary stones or obstruction. The bladder is mildly thickened but this was also seen on prior studies. Stomach/Bowel: The gastric wall is moderately thickened but no more than previously. Similar moderate thickening of the descending duodenum as well. There are mild thickened folds in some of the left abdominal small bowel but there is no small bowel obstruction or inflammation. Appendix is not seen in this patient. There is mild wall prominence versus underdistention of the ascending colon. There are colonic diverticula without evidence of focal inflammation. Vascular/Lymphatic: Moderate aortoiliac calcific plaque. No AAA. No adenopathy. Reproductive: Mildly enlarged prostate impressing on the bladder base. Other: Prior umbilical hernia repair, without recurrence. No free air, hemorrhage or free fluid. Musculoskeletal: Advanced degenerative disc disease, spondylosis and reactive endplate sclerosis again noted L5-S1 with disc osteophyte complex encroaching on both S1 nerve roots with a least mild S1 nerve root compression. Severe bilateral foraminal stenosis. Lesser degenerative disc disease L4-5. Bilateral SI joint ankylosis anteriorly. 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 IMPRESSION: 1. Imaging findings of gastroenteritis. No bowel obstruction or mesenteric inflammation. 2. Prior peripancreatic edematous findings are not seen today. 3. Moderate to severe hepatic steatosis. 4. Ascending colitis versus nondistention.  Diverticulosis. 5. Prominent prostate impressing on the bladder base with mild bladder thickening, likely hypertrophy and unchanged. 6. Haziness in the right lower lobe, which could be due to respiratory motion artifact,  microatelectasis or pneumonitis. 7. Aortic atherosclerosis. Electronically Signed   By: Almira Bar M.D.   On: 05/06/2021 05:19   DG Chest Port 1 View  Result Date: 05/06/2021 CLINICAL DATA:  Chest pain. EXAM: PORTABLE CHEST 1 VIEW COMPARISON:  Chest radiograph dated 11/22/2015. FINDINGS: No focal consolidation, pleural effusion, or pneumothorax. The cardiac silhouette is within normal limits. Atherosclerotic calcification of the aorta. No acute osseous pathology. IMPRESSION: No active disease. Electronically Signed   By: Elgie Collard M.D.   On: 05/06/2021 04:13    Procedures Procedures    Medications Ordered in ED Medications  sodium chloride 0.9 % 1,000 mL with thiamine 100 mg,  folic acid 1 mg, M.V.I. Adult 10 mL infusion (has no administration in time range)  pantoprozole (PROTONIX) 80 mg /NS 100 mL infusion (has no administration in time range)  pantoprazole (PROTONIX) injection 40 mg (has no administration in time range)  acetaminophen (TYLENOL) tablet 650 mg (has no administration in time range)    Or  acetaminophen (TYLENOL) suppository 650 mg (has no administration in time range)  sodium chloride 0.9 % bolus 1,000 mL (1,000 mLs Intravenous New Bag/Given 05/06/21 2235)  pantoprazole (PROTONIX) injection 40 mg (40 mg Intravenous Given 05/06/21 2238)  ondansetron (ZOFRAN) injection 4 mg (4 mg Intravenous Given 05/06/21 2238)    ED Course/ Medical Decision Making/ A&P Clinical Course as of 05/06/21 2325  Sun May 06, 2021  2257 Lipase, blood(!) Lipase is persistently elevated, similar to previous value [JK]  2257 Ethanol(!!) [JK]  2257 Ethanol(!!) Alcohol level at 421 [JK]    Clinical Course User Index [JK] Linwood DibblesKnapp, Mallika Sanmiguel, MD                           Medical Decision Making Amount and/or Complexity of Data Reviewed Labs: ordered. Decision-making details documented in ED Course.  Risk Prescription drug management. Decision regarding hospitalization.   Patient presented  to the ED after leaving AMA earlier today.  Patient was being admitted for GI bleeding hematemesis in the setting of chronic alcohol use.  Patient ended up leaving because he was unhappy on not being able to smoke cigarettes.  Patient is acutely intoxicated again.  His hemoglobin is stable compared to earlier.  No gross blood on rectal exam.  Plan was to admit and possible EGD.  Patient was seen by [facility] gastroenterology earlier today.  I discussed case with Dr. Arlean HoppingHowerter and he will admit the patient for further treatment.        Final Clinical Impression(s) / ED Diagnoses Final diagnoses:  Gastrointestinal hemorrhage, unspecified gastrointestinal hemorrhage type  Anemia, unspecified type  Alcoholic intoxication with complication [facility](HCC)     Linwood DibblesKnapp, Ashyah Quizon, MD 05/06/21 2325

## 2021-05-06 NOTE — ED Notes (Signed)
Pt Samuel Blevins called and updated per request of pt. ?

## 2021-05-06 NOTE — ED Provider Notes (Signed)
[facility] EMERGENCY DEPARTMENT Provider Note   CSN: [id number] Arrival date & time: 05/05/21  2118     History  Chief Complaint  Patient presents with   Hematemesis    Samuel Blevins is a 56 y.o. male.  The history is provided by the patient and medical records.  Samuel Blevins is a 56 y.o. male who presents to the Emergency Department complaining of vomiting.  He presents to the ED for evaluation of hematemesis twice daily for the last week.  He throws up before breakfast and once after lunch.  Vomit is brown and bloody.  He is unable to quantify the amount.  He reports 3-4 episodes of brown diarrhea daily.    Unknown if he has a fever.  Has central abdominal pain, started today.  He also requests cortisone shot in hip and back.    Does not take medication because "I don't do no drugs."  Smokes tobacco.  Drinks "a bunch" of alcohol.  Last drink a few minutes ago.      Home Medications Prior to Admission medications   Medication Sig Start Date End Date Taking? Authorizing Provider  acetaminophen (TYLENOL) 325 MG tablet Take 2 tablets (650 mg total) by mouth every 6 (six) hours as needed for fever, moderate pain, mild pain or headache. 12/12/20   Belva Agee, MD  Ferrous Gluconate 239 (27 Fe) MG TABS Take 1 tablet by mouth every Monday, Wednesday, and Friday. 12/13/20   Katsadouros, Vasilios, MD  folic acid (FOLVITE) 1 MG tablet Take 1 tablet (1 mg total) by mouth daily. 12/13/20   Katsadouros, Vasilios, MD  lipase/protease/amylase (CREON) 36000 UNITS CPEP capsule Take 1 capsule (36,000 Units total) by mouth 3 (three) times daily before meals. 12/12/20   Katsadouros, Vasilios, MD  mineral oil-hydrophilic petrolatum (AQUAPHOR) ointment Apply 1 application topically 2 (two) times daily. Apply to both nostrils twice a day 12/12/20   Belva Agee, MD  Multiple Vitamin (MULTIVITAMIN WITH MINERALS) TABS tablet Take 1 tablet by mouth daily. 12/13/20    Katsadouros, Vasilios, MD  oxymetazoline (AFRIN) 0.05 % nasal spray Place 3 sprays into both nostrils as needed (Bleeding). 12/12/20   Belva Agee, MD  pantoprazole (PROTONIX) 40 MG tablet Take 1 tablet (40 mg total) by mouth daily at 6 (six) AM. 12/13/20   Katsadouros, Vasilios, MD  sodium chloride (OCEAN) 0.65 % SOLN nasal spray Place 2 sprays into both nostrils every 4 (four) hours while awake. 12/12/20   Katsadouros, Vasilios, MD  sucralfate (CARAFATE) 1 g tablet Take 1 tablet (1 g total) by mouth 4 (four) times daily -  with meals and at bedtime. 01/06/21   Geoffery Lyons, MD  thiamine 100 MG tablet Take 1 tablet (100 mg total) by mouth daily. 12/13/20   Belva Agee, MD  thiamine 100 MG tablet Take 1 tablet (100 mg total) by mouth daily. 12/12/20   Belva Agee, MD      Allergies    Patient has no known allergies.    Review of Systems   Review of Systems  All other systems reviewed and are negative.  Physical Exam Updated Vital Signs BP 127/75    Pulse 92    Temp 98 F (36.7 C) (Oral)    Resp 11    Ht 5\' 8"  (1.727 m)    Wt 75.3 kg    SpO2 94%    BMI 25.24 kg/m  Physical Exam Vitals and nursing note reviewed.  Constitutional:      Appearance: He  is well-developed.  HENT:     Head: Normocephalic and atraumatic.  Cardiovascular:     Rate and Rhythm: Regular rhythm. Tachycardia present.     Heart sounds: No murmur heard. Pulmonary:     Effort: Pulmonary effort is normal. No respiratory distress.     Breath sounds: Normal breath sounds.  Abdominal:     Palpations: Abdomen is soft.     Tenderness: There is no guarding or rebound.     Comments: Moderate generalized abdominal tenderness  Genitourinary:    Comments: Light brown stool in rectal vault.  No gross blood.   Musculoskeletal:        General: No tenderness.  Skin:    General: Skin is warm and dry.  Neurological:     Mental Status: He is alert and oriented to person, place, and time.   Psychiatric:        Behavior: Behavior normal.    ED Results / Procedures / Treatments   Labs (all labs ordered are listed, but only abnormal results are displayed) Labs Reviewed  COMPREHENSIVE METABOLIC PANEL - Abnormal; Notable for the following components:      Result Value   Potassium 3.2 (*)    Glucose, Bld 103 (*)    Calcium 8.8 (*)    Total Protein 8.3 (*)    AST 148 (*)    ALT 46 (*)    Anion gap 17 (*)    All other components within normal limits  CBC WITH DIFFERENTIAL/PLATELET - Abnormal; Notable for the following components:   WBC 3.3 (*)    RBC 2.95 (*)    Hemoglobin 7.3 (*)    HCT 24.7 (*)    MCH 24.7 (*)    MCHC 29.6 (*)    RDW 21.4 (*)    Platelets 72 (*)    All other components within normal limits  URINALYSIS, ROUTINE W REFLEX MICROSCOPIC - Abnormal; Notable for the following components:   Hgb urine dipstick SMALL (*)    Protein, ur 100 (*)    Bacteria, UA RARE (*)    All other components within normal limits  LIPASE, BLOOD - Abnormal; Notable for the following components:   Lipase 115 (*)    All other components within normal limits  CBC WITH DIFFERENTIAL/PLATELET - Abnormal; Notable for the following components:   WBC 2.0 (*)    RBC 2.81 (*)    Hemoglobin 7.1 (*)    HCT 23.1 (*)    MCH 25.3 (*)    RDW 21.3 (*)    All other components within normal limits  RESP PANEL BY RT-PCR (FLU A&B, COVID) ARPGX2  PROTIME-INR  ETHANOL  COMPREHENSIVE METABOLIC PANEL  MAGNESIUM  PROTIME-INR  TYPE AND SCREEN    EKG None  Radiology CT Abdomen Pelvis W Contrast  Result Date: 05/06/2021 CLINICAL DATA:  Nausea, vomiting and abdominal pain with previous history of pancreatitis. Hematemesis x1 week with 2 episodes in the past 24 hours. EXAM: CT ABDOMEN AND PELVIS WITH CONTRAST TECHNIQUE: Multidetector CT imaging of the abdomen and pelvis was performed using the standard protocol following bolus administration of intravenous contrast. RADIATION DOSE REDUCTION:  This exam was performed according to the departmental dose-optimization program which includes automated exposure control, adjustment of the mA and/or kV according to patient size and/or use of iterative reconstruction technique. CONTRAST:  OMNIPAQUE IOHEXOL 300 MG/ML  SOLN COMPARISON:  CTs with IV contrast 12/07/2020 and 09/16/2020. FINDINGS: Lower chest: There is respiratory motion on exam. Asymmetric haziness in  the right lower lobe is seen and could be breathing motion artifact, asymmetric microatelectasis, or pneumonitis. Lung bases are otherwise clear.  The cardiac size is normal. Hepatobiliary: 20.3 cm length liver with moderate to severe steatosis. No portal vein dilatation. Gallbladder and bile ducts unremarkable. 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 Areas of focal edema in the head and uncinate processes of the pancreas on the previous exam are also no longer seen. There is no mass enhancement or ductal dilatation. Spleen: Normal in size with uniform enhancement. Adrenals/Urinary Tract: There is no adrenal mass or focal abnormality of the renal cortex. There is no evidence of urinary stones or obstruction. The bladder is mildly thickened but this was also seen on prior studies. Stomach/Bowel: The gastric wall is moderately thickened but no more than previously. Similar moderate thickening of the descending duodenum as well. There are mild thickened folds in some of the left abdominal small bowel but there is no small bowel obstruction or inflammation. Appendix is not seen in this patient. There is mild wall prominence versus underdistention of the ascending colon. There are colonic diverticula without evidence of focal inflammation. Vascular/Lymphatic: Moderate aortoiliac calcific plaque. No AAA. No adenopathy. Reproductive: Mildly enlarged prostate impressing on the bladder base. Other: Prior  umbilical hernia repair, without recurrence. No free air, hemorrhage or free fluid. Musculoskeletal: Advanced degenerative disc disease, spondylosis and reactive endplate sclerosis again noted L5-S1 with disc osteophyte complex encroaching on both S1 nerve roots with a least mild S1 nerve root compression. Severe bilateral foraminal stenosis. Lesser degenerative disc disease L4-5. Bilateral SI joint ankylosis anteriorly. 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 IMPRESSION: 1. Imaging findings of gastroenteritis. No bowel obstruction or mesenteric inflammation. 2. Prior peripancreatic edematous findings are not seen today. 3. Moderate to severe hepatic steatosis. 4. Ascending colitis versus nondistention.  Diverticulosis. 5. Prominent prostate impressing on the bladder base with mild bladder thickening, likely hypertrophy and unchanged. 6. Haziness in the right lower lobe, which could be due to respiratory motion artifact, microatelectasis or pneumonitis. 7. Aortic atherosclerosis. Electronically Signed   By: Almira BarKeith  Chesser M.D.   On: 05/06/2021 05:19   DG Chest Port 1 View  Result Date: 05/06/2021 CLINICAL DATA:  Chest pain. EXAM: PORTABLE CHEST 1 VIEW COMPARISON:  Chest radiograph dated 11/22/2015. FINDINGS: No focal consolidation, pleural effusion, or pneumothorax. The cardiac silhouette is within normal limits. Atherosclerotic calcification of the aorta. No acute osseous pathology. IMPRESSION: No active disease. Electronically Signed   By: Elgie CollardArash  Radparvar M.D.   On: 05/06/2021 04:13    Procedures Procedures    Medications Ordered in ED Medications  acetaminophen (TYLENOL) tablet 650 mg (has no administration in time range)    Or  acetaminophen (TYLENOL) suppository 650 mg (has no administration in time range)  pantoprozole (PROTONIX) 80 mg /NS 100 mL infusion (has no administration in time range)  pantoprazole (PROTONIX)  injection 40 mg (has no administration in time range)  lactated ringers bolus 1,000 mL (0 mLs Intravenous Stopped 05/06/21 0506)  pantoprazole (PROTONIX) injection 40 mg (40 mg Intravenous Given 05/06/21 0343)  iohexol (OMNIPAQUE) 300 MG/ML solution 100 mL (100 mLs Intravenous Contrast Given 05/06/21 0452)    ED Course/ Medical Decision Making/ A&P                           Medical Decision Making Amount  and/or Complexity of Data Reviewed Labs: ordered. Radiology: ordered.  Risk Prescription drug management. Decision regarding hospitalization.   Patient here for evaluation of hematemesis.  He does have a longstanding history of alcohol abuse.  He did have upper endoscopy on record review performed on October 2022.  No varices were found at that time.  At that time he had no AVMs.  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  He had gastritis on his endoscopy.  Hemoglobin is decreased when compared to priors.  Has a decreasing platelet count when compared to priors.  He has no gross blood on rectal examination.  He was treated with Protonix.  Discussed with patient option for blood transfusion if he were to need it.  Patient refuses blood products at this time.  CT scan was obtained to rule out pancreatitis.  CT is consistent with gastritis/gastroenteritis.  Given his symptoms plan to admit for observation.  Medicine consulted for admission.  Patient updated findings of studies and he is in agreement with treatment plan.        Final Clinical Impression(s) / ED Diagnoses Final diagnoses:  Hematemesis with nausea    Rx / DC Orders ED Discharge Orders     None         Tilden Fossa, MD 05/06/21 [phone]

## 2021-05-06 NOTE — ED Notes (Signed)
Pt refusing to give up his cigarettes. Pt was advised of the dangers of smoking while on oxygen. Pt stating he wanted everything roomed from him and wanted to leave. Dr. Ophelia Charter advised of situation and stated pt was able to make decisions for himself and if he wanted to leave he would have to leave AMA. This was explained to pt. Pt refused to sign AMA form. Security at bedside to escort pt out. ?

## 2021-05-06 NOTE — Assessment & Plan Note (Signed)
-  Encourage cessation.   -Patch ordered  

## 2021-05-06 NOTE — Discharge Instructions (Signed)
                  Intensive Outpatient Programs  [facility]    [facility] [address]     [address] [place],  [place]     [place], [place] [phone]      [phone]  [facility]   [facility]  (Inpatient and outpatient)  [phone] (Suboxone and Methadone) [address]           [phone]           [facility]    [facility] - Intensive Outpatient [address]     [address] [place], [place] [address]     [place], [place]  [phone]      [phone]  [facility] (Outpatient, Inpatient, Chemical  [facility] (Groups and Residental) (insurance only) [phone]    [place], [place]          [phone]       [facility]    [facility] (for caregivers and family) [address]     [address] [place], [place]     [place], [place] [phone]      [phone]  Residential Treatment Programs  [facility]  [facility](2 years) Residential: 90 days)  [facility] [address]      [address] [place], [place]     [place], [place] [phone]      [phone] or [phone]  [facility]    [facility] [address]      [address] [place], [place]     , [place] [phone]      [phone]  [facility]   [facility] ([facility] [address]     [address] [place], [place] [address]     [place], [place] [phone]      [phone] Admissions: 8am-3pm M-F  [facility]: Residential Program (90 Days)              [facility]: [facility]  [place], [place]     [place], [place]  [phone] or [phone]    (Walk in Hours over the weekend or by referral)   [facility]: [facility]:[phone] (for crisis  response 24 hours a day) 

## 2021-05-06 NOTE — ED Notes (Signed)
Pt placed on 2L [place] due to sats dropping in the 80's while sleeping. Pt in no respiratory distress, equal rise and fall of chest. ?

## 2021-05-06 NOTE — ED Notes (Signed)
Went into pt room due to  smelling cigarette smoke coming from pt room. Pt advised that he could not be smoking in the room. Security called to remove cigarettes from pt room. ?

## 2021-05-06 NOTE — Assessment & Plan Note (Addendum)
-  Patient is presenting with hematemesis, suggestive of upper GI bleeding. ?-Patient has history of gastritis, duodenal ulcer ?-Most likely diagnosis is gastric or duodenal ulcer, esophagitis or gastritis, or Mallory-Weiss tear. ?-The patient is not tachycardic with normal blood pressure, suggesting subacute volume loss.  ?-Will admit to progressive bed ?-GI consulted ?-NPO for possible EGD (he is not happy about this) ?-LR at 100 mL/hr ?-Start IV pantoprazole bolus and infusion given frank bleeding  ?-Zofran IV for nausea ?-Avoid NSAIDs and SQ heparin ?-Maintain IV access (2 large bore IVs if possible). ?-Empiric Ceftriaxone should be given to those with Marjo Bicker B or C cirrhosis who are presenting with GI bleeding; will hold Rocephin since he appears to be Child Class A ?-May consider Octreotide load and infusion, and beta blocker if patient continues to have hematemesis ? ?ABLA ?-His Hgb decreased from 7.3 to 7.1 in the ER; prior was 8.5 in November.  ?-Type and screen ordered in case patient changes his mind regarding blood products.  ?-He clearly declines blood products at this time ?-Monitor closely and follow cbc q12h ?

## 2021-05-07 ENCOUNTER — Inpatient Hospital Stay (HOSPITAL_COMMUNITY)
Admission: EM | Admit: 2021-05-07 | Discharge: 2021-05-13 | DRG: 378 | Disposition: A | Discharge status: Home / Self Care | Payer: 59 | Attending: Internal Medicine | Admitting: Internal Medicine

## 2021-05-07 ENCOUNTER — Encounter (HOSPITAL_COMMUNITY): Payer: Self-pay | Admitting: Internal Medicine

## 2021-05-07 ENCOUNTER — Other Ambulatory Visit: Payer: Self-pay

## 2021-05-07 ENCOUNTER — Encounter (HOSPITAL_COMMUNITY): Payer: Self-pay | Admitting: Emergency Medicine

## 2021-05-07 DIAGNOSIS — K920 Hematemesis: Secondary | ICD-10-CM

## 2021-05-07 DIAGNOSIS — D649 Anemia, unspecified: Secondary | ICD-10-CM | POA: Diagnosis present

## 2021-05-07 DIAGNOSIS — D72819 Decreased white blood cell count, unspecified: Secondary | ICD-10-CM | POA: Diagnosis present

## 2021-05-07 DIAGNOSIS — Z5329 Procedure and treatment not carried out because of patient's decision for other reasons: Secondary | ICD-10-CM | POA: Diagnosis present

## 2021-05-07 DIAGNOSIS — F10239 Alcohol dependence with withdrawal, unspecified: Secondary | ICD-10-CM | POA: Diagnosis present

## 2021-05-07 DIAGNOSIS — F10231 Alcohol dependence with withdrawal delirium: Secondary | ICD-10-CM | POA: Diagnosis present

## 2021-05-07 DIAGNOSIS — Z716 Tobacco abuse counseling: Secondary | ICD-10-CM

## 2021-05-07 DIAGNOSIS — E876 Hypokalemia: Secondary | ICD-10-CM | POA: Diagnosis present

## 2021-05-07 DIAGNOSIS — Z5901 Sheltered homelessness: Secondary | ICD-10-CM

## 2021-05-07 DIAGNOSIS — Z20822 Contact with and (suspected) exposure to covid-19: Secondary | ICD-10-CM | POA: Diagnosis present

## 2021-05-07 DIAGNOSIS — F1020 Alcohol dependence, uncomplicated: Secondary | ICD-10-CM | POA: Diagnosis present

## 2021-05-07 DIAGNOSIS — K7030 Alcoholic cirrhosis of liver without ascites: Secondary | ICD-10-CM | POA: Diagnosis present

## 2021-05-07 DIAGNOSIS — K2920 Alcoholic gastritis without bleeding: Principal | ICD-10-CM

## 2021-05-07 DIAGNOSIS — F17210 Nicotine dependence, cigarettes, uncomplicated: Secondary | ICD-10-CM | POA: Diagnosis present

## 2021-05-07 DIAGNOSIS — K8689 Other specified diseases of pancreas: Secondary | ICD-10-CM | POA: Diagnosis present

## 2021-05-07 DIAGNOSIS — D62 Acute posthemorrhagic anemia: Secondary | ICD-10-CM | POA: Diagnosis present

## 2021-05-07 DIAGNOSIS — Y908 Blood alcohol level of 240 mg/100 ml or more: Secondary | ICD-10-CM | POA: Diagnosis present

## 2021-05-07 DIAGNOSIS — K529 Noninfective gastroenteritis and colitis, unspecified: Secondary | ICD-10-CM | POA: Diagnosis present

## 2021-05-07 DIAGNOSIS — D500 Iron deficiency anemia secondary to blood loss (chronic): Secondary | ICD-10-CM

## 2021-05-07 DIAGNOSIS — Z91199 Patient's noncompliance with other medical treatment and regimen due to unspecified reason: Secondary | ICD-10-CM

## 2021-05-07 DIAGNOSIS — F1010 Alcohol abuse, uncomplicated: Secondary | ICD-10-CM | POA: Diagnosis present

## 2021-05-07 DIAGNOSIS — K760 Fatty (change of) liver, not elsewhere classified: Secondary | ICD-10-CM | POA: Diagnosis present

## 2021-05-07 DIAGNOSIS — K922 Gastrointestinal hemorrhage, unspecified: Secondary | ICD-10-CM | POA: Diagnosis present

## 2021-05-07 DIAGNOSIS — Z720 Tobacco use: Secondary | ICD-10-CM | POA: Diagnosis present

## 2021-05-07 DIAGNOSIS — J209 Acute bronchitis, unspecified: Secondary | ICD-10-CM | POA: Diagnosis present

## 2021-05-07 DIAGNOSIS — D6959 Other secondary thrombocytopenia: Secondary | ICD-10-CM | POA: Diagnosis present

## 2021-05-07 DIAGNOSIS — K2921 Alcoholic gastritis with bleeding: Principal | ICD-10-CM | POA: Diagnosis present

## 2021-05-07 DIAGNOSIS — I10 Essential (primary) hypertension: Secondary | ICD-10-CM | POA: Diagnosis present

## 2021-05-07 DIAGNOSIS — Z9114 Patient's other noncompliance with medication regimen: Secondary | ICD-10-CM

## 2021-05-07 DIAGNOSIS — D696 Thrombocytopenia, unspecified: Secondary | ICD-10-CM

## 2021-05-07 LAB — CBC WITH DIFFERENTIAL/PLATELET
Abs Immature Granulocytes: 0.03 10*3/uL (ref 0.00–0.07)
Abs Immature Granulocytes: 0.01 10*3/uL (ref 0.00–0.07)
Basophils Absolute: 0 10*3/uL (ref 0.0–0.1)
Basophils Absolute: 0 10*3/uL (ref 0.0–0.1)
Basophils Relative: 1 %
Basophils Relative: 1 %
Eosinophils Absolute: 0 10*3/uL (ref 0.0–0.5)
Eosinophils Absolute: 0 10*3/uL (ref 0.0–0.5)
Eosinophils Relative: 1 %
Eosinophils Relative: 1 %
HCT: 21.8 % — ABNORMAL LOW (ref 39.0–52.0)
HCT: 22 % — ABNORMAL LOW (ref 39.0–52.0)
Hemoglobin: 6.7 g/dL — CL (ref 13.0–17.0)
Hemoglobin: 6.8 g/dL — CL (ref 13.0–17.0)
Immature Granulocytes: 1 %
Immature Granulocytes: 0 %
Lymphocytes Relative: 42 %
Lymphocytes Relative: 37 %
Lymphs Abs: 1 10*3/uL (ref 0.7–4.0)
Lymphs Abs: 1.2 10*3/uL (ref 0.7–4.0)
MCH: 25.7 pg — ABNORMAL LOW (ref 26.0–34.0)
MCH: 26 pg (ref 26.0–34.0)
MCHC: 30.7 g/dL (ref 30.0–36.0)
MCHC: 30.9 g/dL (ref 30.0–36.0)
MCV: 83.5 fL (ref 80.0–100.0)
MCV: 84 fL (ref 80.0–100.0)
Monocytes Absolute: 0.2 10*3/uL (ref 0.1–1.0)
Monocytes Absolute: 0.3 10*3/uL (ref 0.1–1.0)
Monocytes Relative: 10 %
Monocytes Relative: 11 %
Neutro Abs: 1.1 10*3/uL — ABNORMAL LOW (ref 1.7–7.7)
Neutro Abs: 1.6 10*3/uL — ABNORMAL LOW (ref 1.7–7.7)
Neutrophils Relative %: 45 %
Neutrophils Relative %: 50 %
Platelets: 49 10*3/uL — ABNORMAL LOW (ref 150–400)
Platelets: 49 10*3/uL — ABNORMAL LOW (ref 150–400)
RBC: 2.61 MIL/uL — ABNORMAL LOW (ref 4.22–5.81)
RBC: 2.62 MIL/uL — ABNORMAL LOW (ref 4.22–5.81)
RDW: 21.5 % — ABNORMAL HIGH (ref 11.5–15.5)
RDW: 21.5 % — ABNORMAL HIGH (ref 11.5–15.5)
Smear Review: DECREASED
Smear Review: DECREASED
WBC: 2.4 10*3/uL — ABNORMAL LOW (ref 4.0–10.5)
WBC: 3.2 10*3/uL — ABNORMAL LOW (ref 4.0–10.5)
nRBC: 0 % (ref 0.0–0.2)
nRBC: 0 % (ref 0.0–0.2)

## 2021-05-07 LAB — PHOSPHORUS: Phosphorus: 3.8 mg/dL (ref 2.5–4.6)

## 2021-05-07 LAB — COMPREHENSIVE METABOLIC PANEL
ALT: 35 U/L (ref 0–44)
ALT: 41 U/L (ref 0–44)
AST: 92 U/L — ABNORMAL HIGH (ref 15–41)
AST: 128 U/L — ABNORMAL HIGH (ref 15–41)
Albumin: 3.2 g/dL — ABNORMAL LOW (ref 3.5–5.0)
Albumin: 3.6 g/dL (ref 3.5–5.0)
Alkaline Phosphatase: 71 U/L (ref 38–126)
Alkaline Phosphatase: 79 U/L (ref 38–126)
Anion gap: 10 (ref 5–15)
Anion gap: 9 (ref 5–15)
BUN: 7 mg/dL (ref 6–20)
BUN: 5 mg/dL — ABNORMAL LOW (ref 6–20)
CO2: 26 mmol/L (ref 22–32)
CO2: 26 mmol/L (ref 22–32)
Calcium: 7.9 mg/dL — ABNORMAL LOW (ref 8.9–10.3)
Calcium: 8 mg/dL — ABNORMAL LOW (ref 8.9–10.3)
Chloride: 105 mmol/L (ref 98–111)
Chloride: 103 mmol/L (ref 98–111)
Creatinine, Ser: 0.6 mg/dL — ABNORMAL LOW (ref 0.61–1.24)
Creatinine, Ser: 0.72 mg/dL (ref 0.61–1.24)
GFR, Estimated: >60 mL/min (ref >60)
GFR, Estimated: >60 mL/min (ref >60)
Glucose, Bld: 86 mg/dL (ref 70–99)
Glucose, Bld: 88 mg/dL (ref 70–99)
Potassium: 3.3 mmol/L — ABNORMAL LOW (ref 3.5–5.1)
Potassium: 3.4 mmol/L — ABNORMAL LOW (ref 3.5–5.1)
Sodium: 141 mmol/L (ref 135–145)
Sodium: 138 mmol/L (ref 135–145)
Total Bilirubin: 0.5 mg/dL (ref 0.3–1.2)
Total Bilirubin: 0.5 mg/dL (ref 0.3–1.2)
Total Protein: 7.3 g/dL (ref 6.5–8.1)
Total Protein: 7.9 g/dL (ref 6.5–8.1)

## 2021-05-07 LAB — PROTIME-INR
INR: 1 (ref 0.8–1.2)
Prothrombin Time: 13 seconds (ref 11.4–15.2)

## 2021-05-07 LAB — TYPE AND SCREEN
ABO/RH(D): A POS
ABO/RH(D): A POS
Antibody Screen: NEGATIVE
Antibody Screen: NEGATIVE
Unit division: 0

## 2021-05-07 LAB — IRON AND TIBC
Iron: 39 ug/dL — ABNORMAL LOW (ref 45–182)
Saturation Ratios: 9 % — ABNORMAL LOW (ref 17.9–39.5)
TIBC: 458 ug/dL — ABNORMAL HIGH (ref 250–450)
UIBC: 419 ug/dL

## 2021-05-07 LAB — BPAM RBC
Blood Product Expiration Date: 202303222359
Unit Type and Rh: 6200

## 2021-05-07 LAB — MAGNESIUM: Magnesium: 1.4 mg/dL — ABNORMAL LOW (ref 1.7–2.4)

## 2021-05-07 LAB — PREPARE RBC (CROSSMATCH)

## 2021-05-07 LAB — FOLATE: Folate: 28.8 ng/mL (ref >5.9)

## 2021-05-07 LAB — RESP PANEL BY RT-PCR (FLU A&B, COVID) ARPGX2
Influenza A by PCR: NEGATIVE
Influenza B by PCR: NEGATIVE
SARS Coronavirus 2 by RT PCR: NEGATIVE

## 2021-05-07 LAB — RETICULOCYTES
Immature Retic Fract: 30.8 % — ABNORMAL HIGH (ref 2.3–15.9)
RBC.: 2.61 MIL/uL — ABNORMAL LOW (ref 4.22–5.81)
Retic Count, Absolute: 41.5 10*3/uL (ref 19.0–186.0)
Retic Ct Pct: 1.6 % (ref 0.4–3.1)

## 2021-05-07 LAB — PATHOLOGIST SMEAR REVIEW

## 2021-05-07 LAB — LIPASE, BLOOD: Lipase: 124 U/L — ABNORMAL HIGH (ref 11–51)

## 2021-05-07 LAB — FERRITIN: Ferritin: 21 ng/mL — ABNORMAL LOW (ref 24–336)

## 2021-05-07 MED ORDER — NICOTINE 21 MG/24HR TD PT24
21.0000 mg | MEDICATED_PATCH | Freq: Every day | TRANSDERMAL | Status: DC | PRN
Start: 2021-05-07 — End: 2021-05-07

## 2021-05-07 MED ORDER — SODIUM CHLORIDE 0.9% IV SOLUTION: Freq: Once | INTRAVENOUS | Status: DC

## 2021-05-07 NOTE — Assessment & Plan Note (Signed)
#)   Chronic tobacco abuse: Patient conveys that they are a current smoker, having smoked approximately 1 ppd for greater than 30 years.  ?  ?Plan: Counseled the patient on the importance of complete smoking discontinuation.  Order placed for prn nicotine patch for use during this hospitalization. ?

## 2021-05-07 NOTE — ED Triage Notes (Signed)
Patient reports hematemesis yesterday with generalized abdominal pain , he adds chronic  left hip pain for several weeks denies injury/ambulatory. Marland Kitchen  ?

## 2021-05-07 NOTE — ED Notes (Signed)
Pt asleep, oxygen sats in the 80's on room air. ?Pt placed on oxygen 2 liters via nasal canula ?

## 2021-05-07 NOTE — ED Notes (Signed)
He says he does not want a blood transfusion. States "I don't want any one else blood in me" refusing to leave before he will accept a blood transfusion ?

## 2021-05-07 NOTE — ED Notes (Addendum)
Hgb 6.7 per [facility] in lab ?Dr Leafy Half notified ?

## 2021-05-07 NOTE — Assessment & Plan Note (Signed)
#)   Acute on chronic anemia: In the setting of chronic anemia with baseline hemoglobin of 7.5-8.5, with likely contributions from chronic alcohol consumption as well as suspected chronic hepatic insufficiency, presenting hemoglobin found to be slightly lower than baseline, with presenting value of 7.4, with suspicion for an element of acute blood loss contribution in the setting of presenting episode hematemesis, as above.  This hemoglobin value is trending up slightly relative to most recent prior value of 7.3.  As stated above, he is otherwise asymptomatic, and appears hemodynamically stable at this time, with plan for close monitoring of ensuing vital signs for further evaluation of such.  He is status post type and screen. ?  ?  ?Plan: work-up and management for presenting suspected acute upper GI bleed, as above, including close monitoring of Q4H H&H's, with clinical evaluation for determination of need for blood transfusion, as further described above. Monitor on telemetry. Monitor continuous pulse-ox. NPO. Refraining from pharmacologic DVT prophylaxis. Check INR.  Add on the following to initial lab specimen collected in the ED today: total iron, TIBC, ferritin, MMA, folic acid level, reticulocyte count.  ?

## 2021-05-07 NOTE — Assessment & Plan Note (Signed)
#)   Acute Upper GI Bleed: diagnosis on the basis of patient's report of 2 episodes of hematemesis over the last 48 hours, with most recent such episode occurring on the afternoon of 05/06/2021, and associated with mild acute exacerbation of his chronic anemia, as further quantified below.  Of note, his BUN is not elevated and is trending down relative to most recent prior, while DRE performed by EDP reportedly reveals brown-colored stool that was fecal occult blood negative.  ?  ?Not on any blood thinners as an outpatient, including no aspirin. Denies NSAID use.  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 ?  ?The patient was admitted in the early morning hours of 05/06/2021 with aforementioned acute upper GI bleed before subsequently leaving AMA.  Prior to leaving AMA, Willette Cluster of [facility] gastroenterology was consulted and evaluated the patient, recommending continuation of Protonix drip as well as pursuit of iron studies.  Per this GI consult, there was not an imminent indication for EGD during the hospitalization, but recommendations to closely monitor from a clinical standpoint, with the possibility of EGD if needed.  No recommendations for SBP prophylaxis nor octreotide. He underwent no PRBC transfusion.  ?  ?Readmitting the patient this evening for overnight observation after a second episode of hematemesis on the morning of 05/06/2021, with presenting hemoglobin 7.4, slightly higher than his admitting hemoglobin yesterday is 7.3.  BUN nonelevated and trending up, now 8 compared to 10.  He is otherwise asymptomatic, and appears hemodynamically stable at this time.  Consequently, will resume Protonix drip and trend serial  hemoglobin, with potential for reconsulting gastroenterology depending upon ensuing clinical course.  However, given that the patient already underwent formal gastroenterology consult within the last 12 hours, without definitive plan for EGD, in the context of stable hemoglobin and apparent hemodynamic stability, will refrain from urgent overnight reconsultation of gastroenterology unless ensuing deviation warranted from a clinical standpoint.  No episodes of hematemesis in the ED today. ?  ?  ?  ?Plan: NPO. Refraining from pharmacologic DVT prophylaxis. Monitor on telemetry. Monitor continuous pulse-ox. Maintain at least 2 large bore IV's. Check INR. Q4H H&H's have been ordered through 9 AM on 05/07/2021. Will closely monitor these ensuing Hgb levels and correlate these data points with the patient's overall clinical picture including vital signs to determine need for transfusion. Further evaluation and management of corresponding acute on chronic blood loss anemia, including adding-on iron studies to pre-transfusion specimen, as further detailed below. CMP in the AM.  Protonix drip, as above.  Status post type and screen.  Counseled patient on importance of reduction in alcohol consumption. ?

## 2021-05-07 NOTE — Progress Notes (Addendum)
HOSPITAL MEDICINE OVERNIGHT EVENT NOTE   ? ?Notified by nursing the patient's hemoglobin is 6.7 this morning.  Patient does not appear to exhibit gross active bleeding.  No further episodes of hematemesis. ? ?Patient denies chest pain or shortness of breath.  Patient is hemodynamically stable. ? ?1 unit packed red blood cell transfusion will be initiated with posttransfusion hemoglobin and hematocrit ordered. ? ?I additionally have sent a secure chat message to Dr. Barron Alvine with [facility] to request a nonurgent consultation for consideration of endoscopic work-up. ? ?Marinda Elk  MD ?[facility]  ? ?ADDENDUM (3/13 6:20am) ? ?Patient is refusing blood transfusion, stating he would leave AMA before receiving one. ? ?Deno Lunger Sherryl Valido ? ? ? ? ? ? ? ? ? ? ? ? ?

## 2021-05-07 NOTE — ED Notes (Signed)
Pt A&O x4 refusing to stay and get medical treatment. ?This RN explained to pt that he needs medical attention and thinks he should stay. Explained to pt that GI is coming to see him. Pt still refusing to stay, insisting that I remove IV's and monitor.  ? ?Dr Cyd Silence aware. AMA form signed by patient. ?

## 2021-05-07 NOTE — ED Provider Triage Note (Signed)
Emergency Medicine Provider Triage Evaluation Note ? ?Samuel Blevins , a 57 y.o. male  was evaluated in triage.  Pt complains of abd pain. Has been seen multiple times over the last several days for hematemesis and anemia but has left AMA twice. States he is willing to be admitted today. He states he had to leave earlier to go to court.  ? ?Review of Systems  ?Positive: Abd pain, hematemesis ?Negative: fever ? ?Physical Exam  ?BP 138/86 (BP Location: Right Arm)   Pulse 100   Temp (!) 96.8 ?F (36 ?C) (Oral)   Resp 18   SpO2 98%  ?Gen:   Awake, no distress   ?Resp:  Normal effort  ?MSK:   Moves extremities without difficulty  ? ?Medical Decision Making  ?Medically screening exam initiated at 8:09 PM.  Appropriate orders placed.  Samuel Blevins was informed that the remainder of the evaluation will be completed by another provider, this initial triage assessment does not replace that evaluation, and the importance of remaining in the ED until their evaluation is complete. ? ? ?  ?Samuel Meres, PA-C ?05/07/21 2011 ? ?

## 2021-05-07 NOTE — Assessment & Plan Note (Signed)
#)   Acute alcohol intoxication superimposed on chronic Alcohol Abuse: In the setting of the patient's report of daily consumption of greater than 6 beers, it appears that he consumed alcohol after leaving the hospital AMA earlier in the day, with presenting serum ethanol level noted to be 421.  Given this establish timeframe of most recent alcohol consumption, it appears to be diminished risk for alcohol withdrawal seizures over the next 12 hours.  However, we will closely monitor for ensuing evidence of alcohol withdrawal, including close attention to trend vital signs as well as close monitoring of associated electrolytes, as further detailed below.  Of note, urinary drug screen was pan negative. ?  ?  ?Plan: counseled the patient on the importance of reduction in alcohol consumption. Consult to transition of care team placed. Close monitoring of ensuing BP and HR via routine VS. telemetry. Add-on serum Mg level. Check serum phosphorus level. Repeat CMP in the morning. Check INR.  Banana bag times 1 running at 75 cc/h has been ordered.  Additional decision making regarding ensuing supplementation thiamine, folic acid, and multivitamin, including route of administration depending upon ensuing status as it relates to his presenting acute upper gastrointestinal bleed. ?

## 2021-05-07 NOTE — H&P (Signed)
History and Physical    PLEASE NOTE THAT DRAGON DICTATION SOFTWARE WAS USED IN THE CONSTRUCTION OF THIS NOTE.   Samuel Blevins ZOX:[id number] DOB: [birth year] DOA: 05/06/2021  PCP: Pcp, No  (will further assess) Patient coming from: home   I have personally briefly reviewed patient's old medical records in [facility]  Chief Complaint: Hematemesis  HPI: Samuel Blevins is a 56 y.o. male with medical history significant for chronic alcohol abuse, chronic pancytopenia dating back to November 2022 including chronic anemia with baseline hemoglobin 7.5-8.5, chronic tobacco abuse, who is admitted to [facility] on 05/06/2021 with suspected acute upper gastrointestinal bleed after presenting from home to [facility] ED complaining of hematemesis.   Patient was just admitted in the early hours of 05/06/2021 after presenting with an episode of hematemesis in which she vomited up bright red blood in the hours leading up to that presentation, prompting admission at that time for acute upper gastrointestinal bleed with acute on chronic anemia, with hemoglobin at that time noted to be 7.3 relative to his baseline hemoglobin range 7.5-8.5, with most recent prior hemoglobin data point noted to be 8.5 on 01/05/2021.  He was started on Protonix drip and Willette Cluster of [place] gastroenterology was consulted and evaluated the patient, recommending continuation of Protonix drip as well as pursuit of iron studies.  Per this GI consult, there was not an imminent indication for EGD during the hospitalization, but recommendations to closely monitor from a clinical standpoint, with the possibility of EGD if needed.  No additional episodes of hematemesis were noted and the patient ultimately left the hospital AMA.   He subsequently returns to [facility] emergency department this evening complaining of an additional episode of hematemesis, associated with 1 episode of vomiting bright red blood.  Denies any associated  melena, hematochezia, or diarrhea.  Denies any associated significant abdominal discomfort nor any recent trauma.  He reports mild residual nausea, but denies any associated chest pain, shortness of breath, palpitations, diaphoresis, dizziness, presyncope, or syncope.  Denies any associated subjective fever, chills, rigors, or generalized myalgias.  He acknowledges daily alcohol consumption in excess of 6 beers per day, and conveys alcohol consumption after leaving the hospital AMA earlier today.   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  No documented history of esophageal varices.  Not on any blood thinners as an outpatient, including no aspirin.  Denies any regular use of NSAIDs.  It appears that most recent prior EGD occurred in October 2022.  Patient denies any prior requirement for blood transfusion.    ED Course:  Vital signs in the ED were notable for the following: Afebrile; heart rate 9623; blood pressure 111/56- 137/90; respiratory rate 16, oxygen saturation 98% on room air.  Labs were notable for the following: CMP notable for the following: Sodium 138, bicarbonate 23, BUN 8, compared to 10 on 05/05/2021, creatinine 0.77 compared to 0.1 on 05/05/2021, alkaline phosphatase 88, AST 108.  148 on 05/05/2021, ALT 41, total bilirubin 0.6.  Lipase 142.  CBC notable for the following: Lipid cell count 2700 compared to 2000 on 05/06/2021 and 2.6 on 03/07/2020, hemoglobin 7.4 compared to 7.3 on 05/05/2021, platelet count 60 compared to 57 on 05/05/2021 and compared to 106 on 03/07/2020.  Fecal occult blood negative.  Urinary drug screen pan negative.  Serum ethanol level 421.  COVID-19/influenza PCR negative.  While in the ED, the following were  administered: Zofran 4 mg IV x1, Protonix 40 mg IV x1, normal saline x1 L bolus followed by continuous NS at 125 cc/h.  Subsequently, the patient was  admitted for suspected acute upper gastrointestinal bleed with acute on chronic anemia.      Review of Systems: As per HPI otherwise 10 point review of systems negative.   Past Medical History:  Diagnosis Date   Alcohol withdrawal syndrome with complication (HCC)    Alcoholism (HCC)    Elevated AST (SGOT)    Homeless    Hypertension    Symptomatic anemia 11/23/2015    Past Surgical History:  Procedure Laterality Date   BIOPSY  06/23/2019   Procedure: BIOPSY;  Surgeon: Shellia Cleverly, DO;  Location: [facility] ENDOSCOPY;  Service: Gastroenterology;;   BIOPSY  12/12/2020   Procedure: BIOPSY;  Surgeon: Lynann Bologna, MD;  Location[facility] ENDOSCOPY;  Service: Endoscopy;;   ESOPHAGOGASTRODUODENOSCOPY (EGD) WITH PROPOFOL N/A 06/23/2019   Procedure: ESOPHAGOGASTRODUODENOSCOPY (EGD) WITH PROPOFOL;  Surgeon: Shellia Cleverly, DO;  Location: [facility] ENDOSCOPY;  Service: Gastroenterology;  Laterality: N/A;   ESOPHAGOGASTRODUODENOSCOPY (EGD) WITH PROPOFOL N/A 12/12/2020   Procedure: ESOPHAGOGASTRODUODENOSCOPY (EGD) WITH PROPOFOL;  Surgeon: Lynann Bologna, MD;  Location: [facility] ENDOSCOPY;  Service: Endoscopy;  Laterality: N/A;   HOT HEMOSTASIS N/A 06/23/2019   Procedure: HOT HEMOSTASIS (ARGON PLASMA COAGULATION/BICAP);  Surgeon: Shellia Cleverly, DO;  Location: [facility] ENDOSCOPY;  Service: Gastroenterology;  Laterality: N/A;    Social History:  reports that he has been smoking. He has a 30.00 pack-year smoking history. He has never used smokeless tobacco. He reports current alcohol use. He reports that he does not use drugs.   No Known Allergies  Family History  Problem Relation Age of Onset   Diabetes Mellitus II Neg Hx    Colon cancer Neg Hx    Stomach cancer Neg Hx    Pancreatic cancer Neg Hx     Family history reviewed and not pertinent    Prior to Admission medications   Medication Sig Start Date End Date Taking? Authorizing Provider  acetaminophen (TYLENOL) 325 MG tablet Take 2 tablets (650 mg  total) by mouth every 6 (six) hours as needed for fever, moderate pain, mild pain or headache. Patient not taking: Reported on 05/06/2021 12/12/20   Belva Agee, MD  Ferrous Gluconate 239 (27 Fe) MG TABS Take 1 tablet by mouth every Monday, Wednesday, and Friday. Patient not taking: Reported on 05/06/2021 12/13/20   Belva Agee, MD  folic acid (FOLVITE) 1 MG tablet Take 1 tablet (1 mg total) by mouth daily. Patient not taking: Reported on 05/06/2021 12/13/20   Belva Agee, MD  lipase/protease/amylase (CREON) 36000 UNITS CPEP capsule Take 1 capsule (36,000 Units total) by mouth 3 (three) times daily before meals. Patient not taking: Reported on 05/06/2021 12/12/20   Belva Agee, MD  mineral oil-hydrophilic petrolatum (AQUAPHOR) ointment Apply 1 application topically 2 (two) times daily. Apply to both nostrils twice a day Patient not taking: Reported on 05/06/2021 12/12/20   Belva Agee, MD  Multiple Vitamin (MULTIVITAMIN WITH MINERALS) TABS tablet Take 1 tablet by mouth daily. Patient not taking: Reported on 05/06/2021 12/13/20   Belva Agee, MD  oxymetazoline (AFRIN) 0.05 % nasal spray Place 3 sprays into both nostrils as needed (Bleeding). Patient not taking: Reported on 05/06/2021 12/12/20   Belva Agee, MD  pantoprazole (PROTONIX) 40 MG tablet Take 1 tablet (40 mg total) by mouth daily at 6 (six) AM. Patient not taking: Reported on 05/06/2021 12/13/20   Katsadouros,  Vasilios, MD  sodium chloride (OCEAN) 0.65 % SOLN nasal spray Place 2 sprays into both nostrils every 4 (four) hours while awake. Patient not taking: Reported on 05/06/2021 12/12/20   Belva Agee, MD  sucralfate (CARAFATE) 1 g tablet Take 1 tablet (1 g total) by mouth 4 (four) times daily -  with meals and at bedtime. Patient not taking: Reported on 05/06/2021 01/06/21   Geoffery Lyons, MD  thiamine 100 MG tablet Take 1 tablet (100 mg total) by mouth  daily. Patient not taking: Reported on 05/06/2021 12/13/20   Belva Agee, MD  thiamine 100 MG tablet Take 1 tablet (100 mg total) by mouth daily. Patient not taking: Reported on 05/06/2021 12/12/20   Belva Agee, MD     Objective    Physical Exam: Vitals:   05/06/21 2036 05/06/21 2300 05/06/21 2330  BP: 137/90 128/72 (!) 111/56  Pulse: (!) 109    Resp: 16    Temp: 98.5 F (36.9 C)    TempSrc: Oral    SpO2: 98%      General: appears to be stated age; alert, oriented Skin: warm, dry, no rash Head:  AT/Sunrise Mouth:  Oral mucosa membranes appear dry, normal dentition Neck: supple; trachea midline Heart:  RRR; did not appreciate any M/R/G Lungs: CTAB, did not appreciate any wheezes, rales, or rhonchi Abdomen: + BS; soft, ND, NT Vascular: 2+ pedal pulses b/l; 2+ radial pulses b/l Extremities: no peripheral edema, no muscle wasting Neuro: strength and sensation intact in upper and lower extremities b/l    Labs on Admission: I have personally reviewed following labs and imaging studies  CBC: Recent Labs  Lab 05/05/21 2216 05/06/21 0648 05/06/21 2114  WBC 3.3* 2.0* 2.7*  NEUTROABS 1.7 0.7* 1.2*  HGB 7.3* 7.1* 7.4*  HCT 24.7* 23.1* 25.0*  MCV 83.7 82.2 83.3  PLT 72* 57* 60*   Basic Metabolic Panel: Recent Labs  Lab 05/05/21 2216 05/06/21 0648 05/06/21 2114  NA 138 137 138  K 3.2* 3.3* 3.4*  CL 99 100 101  CO2 GLUCOSE 103* 78 96  BUN CREATININE 0.81 0.67 0.77  CALCIUM 8.8* 8.2* 8.7*  MG  --  1.5*  --    GFR: Estimated Creatinine Clearance: 99.8 mL/min (by C-G formula based on SCr of 0.77 mg/dL). Liver Function Tests: Recent Labs  Lab 05/05/21 2216 05/06/21 0648 05/06/21 2114  AST 148* 124* 108*  ALT 46* 41 41  ALKPHOS 88 79 88  BILITOT 0.7 0.5 0.6  PROT 8.3* 7.3 8.2*  ALBUMIN 3.7 3.3* 3.7   Recent Labs  Lab 05/05/21 2216 05/06/21 2159  LIPASE 115* 142*   No results for input(s): AMMONIA in the last 168  hours. Coagulation Profile: Recent Labs  Lab 05/05/21 2216 05/06/21 0648  INR 1.0 1.0   Cardiac Enzymes: No results for input(s): CKTOTAL, CKMB, CKMBINDEX, TROPONINI in the last 168 hours. BNP (last 3 results) No results for input(s): PROBNP in the last 8760 hours. HbA1C: No results for input(s): HGBA1C in the last 72 hours. CBG: No results for input(s): GLUCAP in the last 168 hours. Lipid Profile: No results for input(s): CHOL, HDL, LDLCALC, TRIG, CHOLHDL, LDLDIRECT in the last 72 hours. Thyroid Function Tests: No results for input(s): TSH, T4TOTAL, FREET4, T3FREE, THYROIDAB in the last 72 hours. Anemia Panel: No results for input(s): VITAMINB12, FOLATE, FERRITIN, TIBC, IRON, RETICCTPCT in the last 72 hours. Urine analysis:    Component Value Date/Time   COLORURINE YELLOW  05/05/2021 2216   APPEARANCEUR CLEAR 05/05/2021 2216   LABSPEC 1.011 05/05/2021 2216   PHURINE 6.0 05/05/2021 2216   GLUCOSEU NEGATIVE 05/05/2021 2216   HGBUR SMALL (A) 05/05/2021 2216   BILIRUBINUR NEGATIVE 05/05/2021 2216   KETONESUR NEGATIVE 05/05/2021 2216   PROTEINUR 100 (A) 05/05/2021 2216   NITRITE NEGATIVE 05/05/2021 2216   LEUKOCYTESUR NEGATIVE 05/05/2021 2216    Radiological Exams on Admission: CT Abdomen Pelvis W Contrast  Result Date: 05/06/2021 CLINICAL DATA:  Nausea, vomiting and abdominal pain with previous history of pancreatitis. Hematemesis x1 week with 2 episodes in the past 24 hours. EXAM: CT ABDOMEN AND PELVIS WITH CONTRAST TECHNIQUE: Multidetector CT imaging of the abdomen and pelvis was performed using the standard protocol following bolus administration of intravenous contrast. RADIATION DOSE REDUCTION: This exam was performed according to the departmental dose-optimization program which includes automated exposure control, adjustment of the mA and/or kV according to patient size and/or use of iterative reconstruction technique. CONTRAST:  OMNIPAQUE IOHEXOL 300 MG/ML  SOLN  COMPARISON:  CTs with IV contrast 12/07/2020 and 09/16/2020. FINDINGS: Lower chest: There is respiratory motion on exam. Asymmetric haziness in the right lower lobe is seen and could be breathing motion artifact, asymmetric microatelectasis, or pneumonitis. Lung bases are otherwise clear.  The cardiac size is normal. Hepatobiliary: 20.3 cm length liver with moderate to severe steatosis. No portal vein dilatation. Gallbladder and bile ducts unremarkable. 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 Areas of focal edema in the head and uncinate processes of the pancreas on the previous exam are also no longer seen. There is no mass enhancement or ductal dilatation. Spleen: Normal in size with uniform enhancement. Adrenals/Urinary Tract: There is no adrenal mass or focal abnormality of the renal cortex. There is no evidence of urinary stones or obstruction. The bladder is mildly thickened but this was also seen on prior studies. Stomach/Bowel: The gastric wall is moderately thickened but no more than previously. Similar moderate thickening of the descending duodenum as well. There are mild thickened folds in some of the left abdominal small bowel but there is no small bowel obstruction or inflammation. Appendix is not seen in this patient. There is mild wall prominence versus underdistention of the ascending colon. There are colonic diverticula without evidence of focal inflammation. Vascular/Lymphatic: Moderate aortoiliac calcific plaque. No AAA. No adenopathy. Reproductive: Mildly enlarged prostate impressing on the bladder base. Other: Prior umbilical hernia repair, without recurrence. No free air, hemorrhage or free fluid. Musculoskeletal: Advanced degenerative disc disease, spondylosis and reactive endplate sclerosis again noted L5-S1 with disc osteophyte complex encroaching on both S1 nerve roots with a least mild S1  nerve root compression. Severe bilateral foraminal stenosis. Lesser degenerative disc disease L4-5. Bilateral SI joint ankylosis anteriorly. 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 IMPRESSION: 1. Imaging findings of gastroenteritis. No bowel obstruction or mesenteric inflammation. 2. Prior peripancreatic edematous findings are not seen today. 3. Moderate to severe hepatic steatosis. 4. Ascending colitis versus nondistention.  Diverticulosis. 5. Prominent prostate impressing on the bladder base with mild bladder thickening, likely hypertrophy and unchanged. 6. Haziness in the right lower lobe, which could be due to respiratory motion artifact, microatelectasis or pneumonitis. 7. Aortic atherosclerosis. Electronically Signed   By: Almira Bar M.D.   On: 05/06/2021 05:19   DG Chest Port 1 View  Result Date: 05/06/2021 CLINICAL DATA:  Chest pain. EXAM: PORTABLE CHEST  1 VIEW COMPARISON:  Chest radiograph dated 11/22/2015. FINDINGS: No focal consolidation, pleural effusion, or pneumothorax. The cardiac silhouette is within normal limits. Atherosclerotic calcification of the aorta. No acute osseous pathology. IMPRESSION: No active disease. Electronically Signed   By: Elgie CollardArash  Radparvar M.D.   On: 05/06/2021 04:13      Assessment/Plan   Principal Problem:   Acute upper GI bleed Active Problems:   Acute on chronic anemia   Tobacco abuse   Chronic alcohol abuse        #) Acute Upper GI Bleed: diagnosis on the basis of patient's report of 2 episodes of hematemesis over the last 48 hours, with most recent such episode occurring on the afternoon of 05/06/2021, and associated with mild acute exacerbation of his chronic anemia, as further quantified below.  Of note, his BUN is not elevated and is trending down relative to most recent prior, while DRE performed by EDP reportedly reveals brown-colored stool that was fecal occult blood  negative.   Not on any blood thinners as an outpatient, including no aspirin. Denies NSAID use.  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  The patient was admitted in the early morning hours of 05/06/2021 with aforementioned acute upper GI bleed before subsequently leaving AMA.  Prior to leaving AMA, Willette Cluster[facility]ula Guenther [facility] was consulted and evaluated the patient, recommending continuation of Protonix drip as well as pursuit of iron studies.  Per this GI consult, there was not an imminent indication for EGD during the hospitalization, but recommendations to closely monitor from a clinical standpoint, with the possibility of EGD if needed.  No recommendations for SBP prophylaxis nor octreotide. He underwent no PRBC transfusion.   Readmitting the patient this evening for overnight observation after a second episode of hematemesis on the morning of 05/06/2021, with presenting hemoglobin 7.4, slightly higher than his admitting hemoglobin yesterday is 7.3.  BUN nonelevated and trending up, now 8 compared to 10.  He is otherwise asymptomatic, and appears hemodynamically stable at this time.  Consequently, will resume Protonix drip and trend serial hemoglobin, with potential for reconsulting gastroenterology depending upon ensuing clinical course.  However, given that the patient already underwent formal gastroenterology consult within the last 12 hours, without definitive plan for EGD, in the context of stable hemoglobin and apparent hemodynamic stability, will refrain from urgent overnight reconsultation of gastroenterology unless ensuing deviation warranted from a clinical standpoint.  No episodes of hematemesis in  the ED today.      Plan: NPO. Refraining from pharmacologic DVT prophylaxis. Monitor on telemetry. Monitor continuous pulse-ox. Maintain at least 2 large bore IV's. Check INR. Q4H H&H's have been ordered through 9 AM on 05/07/2021. Will closely monitor these ensuing Hgb levels and correlate these data points with the patient's overall clinical picture including vital signs to determine need for transfusion. Further evaluation and management of corresponding acute on chronic blood loss anemia, including adding-on iron studies to pre-transfusion specimen, as further detailed below. CMP in the AM.  Protonix drip, as above.  Status post type and screen.  Counseled patient on importance of reduction in alcohol consumption.       #) Acute on chronic anemia: In the setting of chronic anemia with baseline  hemoglobin of 7.5-8.5, with likely contributions from chronic alcohol consumption as well as suspected chronic hepatic insufficiency, presenting hemoglobin found to be slightly lower than baseline, with presenting value of 7.4, with suspicion for an element of acute blood loss contribution in the setting of presenting episode hematemesis, as above.  This hemoglobin value is trending up slightly relative to most recent prior value of 7.3.  As stated above, he is otherwise asymptomatic, and appears hemodynamically stable at this time, with plan for close monitoring of ensuing vital signs for further evaluation of such.  He is status post type and screen.   Plan: work-up and management for presenting suspected acute upper GI bleed, as above, including close monitoring of Q4H H&H's, with clinical evaluation for determination of need for blood transfusion, as further described above. Monitor on telemetry. Monitor continuous pulse-ox. NPO. Refraining from pharmacologic DVT prophylaxis. Check INR.  Add on the following to initial lab specimen collected in the ED today: total iron, TIBC, ferritin, MMA, folic acid  level, reticulocyte count.        #) Acute alcohol intoxication superimposed on chronic Alcohol Abuse: In the setting of the patient's report of daily consumption of greater than 6 beers, it appears that he consumed alcohol after leaving the hospital AMA earlier in the day, with presenting serum ethanol level noted to be 421.  Given this establish timeframe of most recent alcohol consumption, it appears to be diminished risk for alcohol withdrawal seizures over the next 12 hours.  However, we will closely monitor for ensuing evidence of alcohol withdrawal, including close attention to trend vital signs as well as close monitoring of associated electrolytes, as further detailed below.  Of note, urinary drug screen was pan negative.   Plan: counseled the patient on the importance of reduction in alcohol consumption. Consult to transition of care team placed. Close monitoring of ensuing BP and HR via routine VS. telemetry. Add-on serum Mg level. Check serum phosphorus level. Repeat CMP in the morning. Check INR.  Banana bag times 1 running at 75 cc/h has been ordered.  Additional decision making regarding ensuing supplementation thiamine, folic acid, and multivitamin, including route of administration depending upon ensuing status as it relates to his presenting acute upper gastrointestinal bleed.       #) Chronic tobacco abuse: Patient conveys that they are a current smoker, having smoked approximately 1 ppd for greater than 30 years.   Plan: Counseled the patient on the importance of complete smoking discontinuation.  Order placed for prn nicotine patch for use during this hospitalization.       DVT prophylaxis: SCD's   Code Status: Full code Family Communication: none Disposition Plan: Per Rounding Team Consults called: Willette Cluster of [place] gastroenterology was consulted on 05/06/21, prior to patient leaving hospital AMA;  Admission status: Observation   PLEASE NOTE THAT DRAGON  DICTATION SOFTWARE WAS USED IN THE CONSTRUCTION OF THIS NOTE.   Chaney Born Mandee Pluta DO [facility]  From 7PM - 7AM   05/07/2021, 2:02 AM

## 2021-05-08 DIAGNOSIS — Z720 Tobacco use: Secondary | ICD-10-CM

## 2021-05-08 DIAGNOSIS — K920 Hematemesis: Secondary | ICD-10-CM

## 2021-05-08 DIAGNOSIS — K922 Gastrointestinal hemorrhage, unspecified: Secondary | ICD-10-CM | POA: Diagnosis present

## 2021-05-08 DIAGNOSIS — K2971 Gastritis, unspecified, with bleeding: Secondary | ICD-10-CM | POA: Diagnosis not present

## 2021-05-08 DIAGNOSIS — D649 Anemia, unspecified: Secondary | ICD-10-CM | POA: Diagnosis not present

## 2021-05-08 DIAGNOSIS — K8689 Other specified diseases of pancreas: Secondary | ICD-10-CM

## 2021-05-08 DIAGNOSIS — F1020 Alcohol dependence, uncomplicated: Secondary | ICD-10-CM

## 2021-05-08 DIAGNOSIS — F10288 Alcohol dependence with other alcohol-induced disorder: Secondary | ICD-10-CM | POA: Diagnosis not present

## 2021-05-08 DIAGNOSIS — K529 Noninfective gastroenteritis and colitis, unspecified: Secondary | ICD-10-CM

## 2021-05-08 DIAGNOSIS — J209 Acute bronchitis, unspecified: Secondary | ICD-10-CM

## 2021-05-08 LAB — CBC
HCT: 19.4 % — ABNORMAL LOW (ref 39.0–52.0)
HCT: 21.1 % — ABNORMAL LOW (ref 39.0–52.0)
Hemoglobin: 5.9 g/dL — CL (ref 13.0–17.0)
Hemoglobin: 6.2 g/dL — CL (ref 13.0–17.0)
MCH: 25.2 pg — ABNORMAL LOW (ref 26.0–34.0)
MCH: 24.7 pg — ABNORMAL LOW (ref 26.0–34.0)
MCHC: 30.4 g/dL (ref 30.0–36.0)
MCHC: 29.4 g/dL — ABNORMAL LOW (ref 30.0–36.0)
MCV: 82.9 fL (ref 80.0–100.0)
MCV: 84.1 fL (ref 80.0–100.0)
Platelets: 40 10*3/uL — ABNORMAL LOW (ref 150–400)
Platelets: 38 10*3/uL — ABNORMAL LOW (ref 150–400)
RBC: 2.34 MIL/uL — ABNORMAL LOW (ref 4.22–5.81)
RBC: 2.51 MIL/uL — ABNORMAL LOW (ref 4.22–5.81)
RDW: 21.2 % — ABNORMAL HIGH (ref 11.5–15.5)
RDW: 21.2 % — ABNORMAL HIGH (ref 11.5–15.5)
WBC: 2.6 10*3/uL — ABNORMAL LOW (ref 4.0–10.5)
WBC: 3.6 10*3/uL — ABNORMAL LOW (ref 4.0–10.5)
nRBC: 0.8 % — ABNORMAL HIGH (ref 0.0–0.2)
nRBC: 0 % (ref 0.0–0.2)

## 2021-05-08 LAB — DIC (DISSEMINATED INTRAVASCULAR COAGULATION)PANEL
D-Dimer, Quant: 1.11 ug/mL-FEU — ABNORMAL HIGH (ref 0.00–0.50)
Fibrinogen: 336 mg/dL (ref 210–475)
INR: 1.1 (ref 0.8–1.2)
Platelets: 38 10*3/uL — ABNORMAL LOW (ref 150–400)
Prothrombin Time: 13.9 seconds (ref 11.4–15.2)
Smear Review: NONE SEEN
aPTT: 31 seconds (ref 24–36)

## 2021-05-08 LAB — BASIC METABOLIC PANEL
Anion gap: 8 (ref 5–15)
BUN: <5 mg/dL — ABNORMAL LOW (ref 6–20)
CO2: 25 mmol/L (ref 22–32)
Calcium: 7.5 mg/dL — ABNORMAL LOW (ref 8.9–10.3)
Chloride: 107 mmol/L (ref 98–111)
Creatinine, Ser: 0.65 mg/dL (ref 0.61–1.24)
GFR, Estimated: >60 mL/min (ref >60)
Glucose, Bld: 80 mg/dL (ref 70–99)
Potassium: 3.2 mmol/L — ABNORMAL LOW (ref 3.5–5.1)
Sodium: 140 mmol/L (ref 135–145)

## 2021-05-08 LAB — MAGNESIUM: Magnesium: 1.3 mg/dL — ABNORMAL LOW (ref 1.7–2.4)

## 2021-05-08 LAB — APTT: aPTT: 31 seconds (ref 24–36)

## 2021-05-08 LAB — HEPATIC FUNCTION PANEL
ALT: 39 U/L (ref 0–44)
AST: 126 U/L — ABNORMAL HIGH (ref 15–41)
Albumin: 3.1 g/dL — ABNORMAL LOW (ref 3.5–5.0)
Alkaline Phosphatase: 70 U/L (ref 38–126)
Bilirubin, Direct: 0.1 mg/dL (ref 0.0–0.2)
Indirect Bilirubin: 0.4 mg/dL (ref 0.3–0.9)
Total Bilirubin: 0.5 mg/dL (ref 0.3–1.2)
Total Protein: 6.6 g/dL (ref 6.5–8.1)

## 2021-05-08 LAB — ETHANOL: Alcohol, Ethyl (B): 344 mg/dL (ref <10)

## 2021-05-08 LAB — PROTIME-INR
INR: 1 (ref 0.8–1.2)
Prothrombin Time: 13.5 seconds (ref 11.4–15.2)

## 2021-05-08 MED ORDER — PANTOPRAZOLE 80MG IVPB - SIMPLE MED
80.0000 mg | Freq: Two times a day (BID) | INTRAVENOUS | Status: DC
  Administered 2021-05-08 – 2021-05-10 (×6): 80 mg via INTRAVENOUS
  Filled 2021-05-08 (×4): qty 80
  Filled 2021-05-08 (×3): qty 100
  Filled 2021-05-08 (×2): qty 80

## 2021-05-08 MED ORDER — GUAIFENESIN ER 600 MG PO TB12
600.0000 mg | ORAL_TABLET | Freq: Two times a day (BID) | ORAL | Status: DC
  Administered 2021-05-08 – 2021-05-13 (×10): 600 mg via ORAL
  Filled 2021-05-08 (×10): qty 1

## 2021-05-08 MED ORDER — MORPHINE SULFATE (PF) 2 MG/ML IV SOLN
2.0000 mg | INTRAVENOUS | Status: DC | PRN
Start: 2021-05-08 — End: 2021-05-13
  Administered 2021-05-08 – 2021-05-12 (×8): 2 mg via INTRAVENOUS
  Filled 2021-05-08 (×8): qty 1

## 2021-05-08 MED ORDER — PANTOPRAZOLE INFUSION (NEW) - SIMPLE MED
8.0000 mg/h | INTRAVENOUS | Status: DC
  Administered 2021-05-08: 8 mg/h via INTRAVENOUS
  Filled 2021-05-08: qty 80

## 2021-05-08 MED ORDER — POTASSIUM CHLORIDE IN NACL 20-0.9 MEQ/L-% IV SOLN
INTRAVENOUS | Status: DC
  Filled 2021-05-08 (×3): qty 1000

## 2021-05-08 MED ORDER — THIAMINE HCL 100 MG/ML IJ SOLN
100.0000 mg | Freq: Every day | INTRAMUSCULAR | Status: DC
  Administered 2021-05-08 – 2021-05-11 (×2): 100 mg via INTRAVENOUS
  Filled 2021-05-08 (×2): qty 2

## 2021-05-08 MED ORDER — ORAL CARE MOUTH RINSE
15.0000 mL | Freq: Two times a day (BID) | OROMUCOSAL | Status: DC
  Administered 2021-05-08 – 2021-05-12 (×9): 15 mL via OROMUCOSAL

## 2021-05-08 MED ORDER — PANCRELIPASE (LIP-PROT-AMYL) 12000-38000 UNITS PO CPEP
36000.0000 [IU] | ORAL_CAPSULE | Freq: Three times a day (TID) | ORAL | Status: DC
  Administered 2021-05-08 – 2021-05-13 (×10): 36000 [IU] via ORAL
  Filled 2021-05-08: qty 3
  Filled 2021-05-08: qty 1
  Filled 2021-05-08 (×4): qty 3
  Filled 2021-05-08: qty 1
  Filled 2021-05-08 (×5): qty 3

## 2021-05-08 MED ORDER — OCTREOTIDE LOAD VIA INFUSION
50.0000 ug | Freq: Once | INTRAVENOUS | Status: DC
  Filled 2021-05-08: qty 25

## 2021-05-08 MED ORDER — TRAZODONE HCL 50 MG PO TABS
25.0000 mg | ORAL_TABLET | Freq: Every evening | ORAL | Status: DC | PRN
  Administered 2021-05-08 – 2021-05-12 (×4): 25 mg via ORAL
  Filled 2021-05-08 (×4): qty 1

## 2021-05-08 MED ORDER — SODIUM CHLORIDE 0.9 % IV SOLN
1.0000 g | INTRAVENOUS | Status: DC
  Administered 2021-05-08: 1 g via INTRAVENOUS
  Filled 2021-05-08: qty 10

## 2021-05-08 MED ORDER — IPRATROPIUM-ALBUTEROL 0.5-2.5 (3) MG/3ML IN SOLN
3.0000 mL | Freq: Four times a day (QID) | RESPIRATORY_TRACT | Status: DC
Start: 2021-05-08 — End: 2021-05-08
  Administered 2021-05-08: 3 mL via RESPIRATORY_TRACT
  Filled 2021-05-08: qty 3

## 2021-05-08 MED ORDER — ACETAMINOPHEN 650 MG RE SUPP: 650.0000 mg | Freq: Four times a day (QID) | RECTAL | Status: DC | PRN

## 2021-05-08 MED ORDER — SODIUM CHLORIDE 0.9 % IV SOLN
50.0000 ug/h | INTRAVENOUS | Status: DC
  Administered 2021-05-08: 50 ug/h via INTRAVENOUS
  Filled 2021-05-08: qty 1

## 2021-05-08 MED ORDER — ONDANSETRON HCL 4 MG PO TABS: 4.0000 mg | ORAL_TABLET | Freq: Four times a day (QID) | ORAL | Status: DC | PRN

## 2021-05-08 MED ORDER — THIAMINE HCL 100 MG PO TABS
100.0000 mg | ORAL_TABLET | Freq: Every day | ORAL | Status: DC
  Administered 2021-05-09 – 2021-05-13 (×4): 100 mg via ORAL
  Filled 2021-05-08 (×4): qty 1

## 2021-05-08 MED ORDER — LORAZEPAM 1 MG PO TABS
0.0000 mg | ORAL_TABLET | Freq: Two times a day (BID) | ORAL | Status: AC
  Administered 2021-05-10: 2 mg via ORAL
  Filled 2021-05-08: qty 2
  Filled 2021-05-08: qty 1

## 2021-05-08 MED ORDER — PANTOPRAZOLE 80MG IVPB - SIMPLE MED
80.0000 mg | Freq: Once | INTRAVENOUS | Status: AC
  Administered 2021-05-08: 80 mg via INTRAVENOUS
  Filled 2021-05-08: qty 80

## 2021-05-08 MED ORDER — THIAMINE HCL 100 MG/ML IJ SOLN
Freq: Once | INTRAVENOUS | Status: AC
  Filled 2021-05-08 (×2): qty 1000

## 2021-05-08 MED ORDER — ACETAMINOPHEN 325 MG PO TABS
650.0000 mg | ORAL_TABLET | Freq: Four times a day (QID) | ORAL | Status: DC | PRN
  Administered 2021-05-12: 650 mg via ORAL
  Filled 2021-05-08: qty 2

## 2021-05-08 MED ORDER — PANTOPRAZOLE SODIUM 40 MG IV SOLR
40.0000 mg | Freq: Two times a day (BID) | INTRAVENOUS | Status: DC
Start: 2021-05-11 — End: 2021-05-08

## 2021-05-08 MED ORDER — ONDANSETRON HCL 4 MG/2ML IJ SOLN
4.0000 mg | Freq: Four times a day (QID) | INTRAMUSCULAR | Status: DC | PRN
  Administered 2021-05-09: 4 mg via INTRAVENOUS
  Filled 2021-05-08: qty 2

## 2021-05-08 MED ORDER — MAGNESIUM SULFATE 4 GM/100ML IV SOLN
4.0000 g | Freq: Once | INTRAVENOUS | Status: AC
  Administered 2021-05-08: 4 g via INTRAVENOUS
  Filled 2021-05-08: qty 100

## 2021-05-08 MED ORDER — SODIUM CHLORIDE 0.9 % IV BOLUS
1000.0000 mL | Freq: Once | INTRAVENOUS | Status: AC
  Administered 2021-05-08: 1000 mL via INTRAVENOUS

## 2021-05-08 MED ORDER — LORAZEPAM 2 MG/ML IJ SOLN
0.0000 mg | Freq: Two times a day (BID) | INTRAMUSCULAR | Status: AC
  Administered 2021-05-11 (×2): 2 mg via INTRAVENOUS
  Filled 2021-05-08 (×4): qty 1

## 2021-05-08 MED ORDER — LORAZEPAM 2 MG/ML IJ SOLN
0.0000 mg | Freq: Four times a day (QID) | INTRAMUSCULAR | Status: AC
  Administered 2021-05-08 (×2): 2 mg via INTRAVENOUS
  Administered 2021-05-09: 4 mg via INTRAVENOUS
  Administered 2021-05-09: 2 mg via INTRAVENOUS
  Administered 2021-05-09: 4 mg via INTRAVENOUS
  Filled 2021-05-08: qty 1
  Filled 2021-05-08: qty 2
  Filled 2021-05-08 (×3): qty 1
  Filled 2021-05-08: qty 2

## 2021-05-08 MED ORDER — LORAZEPAM 1 MG PO TABS
0.0000 mg | ORAL_TABLET | Freq: Four times a day (QID) | ORAL | Status: AC
  Administered 2021-05-08: 2 mg via ORAL
  Administered 2021-05-09: 4 mg via ORAL
  Filled 2021-05-08: qty 4
  Filled 2021-05-08: qty 2

## 2021-05-08 MED ORDER — IPRATROPIUM-ALBUTEROL 0.5-2.5 (3) MG/3ML IN SOLN: 3.0000 mL | RESPIRATORY_TRACT | Status: DC | PRN

## 2021-05-08 NOTE — Progress Notes (Addendum)
Interim progress note not for billing. ? ?56 yo hx severe alcohol dependence, alcohol cirrhosis, chronic pancreatitis, and gi bleeding, here third admission in 2 days for hematemesis. Has left AMA twice now. Hemodynamically stable but hgb continues to drop is 5.9 today. Plts which are low at baseline are now 40. He continues to refuse blood products and I have made him abundantly aware this decision may prove fatal, yet he persists with this. He does say he is willing to stay for endoscopy. I will continue PPI and also start octreotide (no known varices but some evidence effective for non-vericeal bleeding). Similarly will start ceftriaxone for ppx given hx of cirrhosis. Will continue lorazepam ciwa for etoh, no sig withdrawal symptoms currently. Will check dic panel. And will ensure GI sees in consult today.  ?

## 2021-05-08 NOTE — Assessment & Plan Note (Addendum)
-   This is acute blood loss anemia. ?- The patient refused to be transfused packed red blood cells. ?- We will follow serial H&H. ?- He understands that if he were to have recurrent bleeding and still refused to be transfused that he can die. ?- Hematology consultation can be called in a.m. possibly for iron transfusion since he refuses PRBCs. ?

## 2021-05-08 NOTE — Assessment & Plan Note (Signed)
- 

## 2021-05-08 NOTE — Progress Notes (Signed)
Pt received on unit. Pt placed on monitor, skin assessment done, pt oriented to room. Pt refusing to answer admissions questions at this time. ?

## 2021-05-08 NOTE — ED Notes (Signed)
Alcohol level of 344 per lab ?

## 2021-05-08 NOTE — Consult Note (Addendum)
? ?                                                                          [facility] Consult: ?10:44 AM ?05/08/2021 ? LOS: 0 days  ? ? ?Referring Provider: Dr Si Raider  ?Primary Care Physician:  Pcp, No ?Primary Gastroenterologist:  Dr Bryan Lemma but multiple no-shows for outpatient follow-up. ? ? Attending physician's note  ?I have taken a history, reviewed the chart and examined the patient. I performed a substantive portion of this encounter, including complete performance of at least one of the key components, in conjunction with the APP. I agree with the APP's note, impression and recommendations.  ?  ?56 year old male with history of chronic alcohol dependence admitted with worsening iron deficiency anemia, coffee-ground emesis and hematemesis ? ?Severe alcohol induced hepatic steatosis ?No evidence of ascites on recent imaging and no varices were noted on EGD 6 months ago ?Elevation in AST secondary to alcohol use ?Discriminant function score less than 32 for acute alcoholic hepatitis ?MELD-Na score: 7 at 05/08/2021  9:47 AM ? ?Hemoglobin 5.9, patient is refusing any blood transfusion or blood products ?He does not want IV iron either ? ?Diffuse wall thickening noted in small bowel and right colon is likely bowel edema secondary to malnutrition and severe hypoalbuminemia and erroneous read by radiology as gastroenteritis and colitis ? ?Start oral iron 1 tablet twice daily with meals ?He was empirically started on octreotide for possible variceal hemorrhage, continue for for now but consider stopping it tomorrow if hemoglobin remains stable ?Use ceftriaxone for 3 days and stop, does not have significant ascites on CT ?If hemoglobin remains stable, can transition to oral Protonix twice daily tomorrow ? ?Monitor for alcohol withdrawal ? ?Patient was requesting diet, ordered low-sodium diet ? ?No plan for  endoscopic intervention at this point .  He is at risk for significantly elevated complications related with anesthesia and procedure due to under resuscitation and severe anemia, refusal with recommendations for blood transfusion ? ?There have been  medication, dietary, and lifestyle compliance issues here. I have discussed with him the great importance of following the treatment plan exactly as directed in order to achieve a good medical outcome.  Patient continues to refuse which may lead to poor prognosis and outcome ? ?The patient was provided an opportunity to ask questions and all were answered. The patient agreed with the plan and demonstrated an understanding of the instructions. ? ?Please call GI back if there is any change in clinical status or if patient changes his mind.  Available if have any questions ? ?K. Denzil Magnuson , MD ?[phone]    ? ?Reason for Consultation: Iron deficiency, acute on chronic anemia.  Coffee like emesis.  Refractory alcohol use disorder. ?  ?HPI: Samuel Blevins is a 56 y.o. male.  Homeless.  Alcohol dependence.  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  Abdominal pain.  Developing pseudocyst per 09/2019 CT.  Fatty liver but no known cirrhosis.  Peptic ulcer disease.  H. pylori positive prescribed Flagyl, Doxy, bismuth 02/2020 but not clear he filled prescription.  Hyponatremia.  Hypertension.  Anemia, prescribed supplemental iron in the past.  Thrombocytopenia.   ?Often comes to the hospital but leaves AMA. ? ?  05/2019 EGD: Normal esophagus.  H. pylori gastritis.  Ablation of multiple nonbleeding duodenal AVMs. ?09/2019 CTAP showed 3.7 x 2.4 lobulated cystic process at pancreatic head.  Fat-containing umbilicus and supraumbilical hernias.  Hepatic steatosis.  Prostamegaly with nodular prostate.  Avascular necrosis R femoral head. ?12/08/2019 MRI abdomen.  Resolved cystic area at the pancreaticoduodenal groove.  Edema at descending duodenum, cannot exclude  duodenitis but favor groove pancreatitis.  Marked, diffuse hepatic steatosis.  Degenerative lumbar spine disease. ?12/07/2020 CTAP showed groove pancreatitis no fluid collections.  Small area in the pancreatic head might represent interstitial edema or tiny area of necrosis.  Stable hepatomegaly and severe hepatic steatosis. ?12/12/2020 EGD: For hematemesis, bloody stool.  This showed mild gastritis, nonbleeding duodenal erosion.  No active bleeding. Gastric pathology showed mild chronic gastritis, stains negative for H. Pylori ? ?Presented to the ED 3/11 but left AMA.  Several days of brown and bloody emesis 1-2 times daily.  3 or 4 episodes of brown diarrhea daily.  Mid abdominal pain started that day. ?05/06/2021 CTAP w contrast: Moderate to severe hepatic steatosis.  PV not dilated.  Gallbladder and bile ducts unremarkable.  Resolved pancreatitis compared with previous imaging.  Stable gastric wall and descending duodenal wall thickening.  Nondistention vs colitis in the ascending colon.  Diverticulosis.  Prostamegaly pressing on base of bladder which is mildly thickened.  Right lower lobe haziness, ? respiratory motion artifact vs microatelectasis vs pneumonitis.   ?05/06/2021 CXR: No active disease ?EGD planned but he left AMA after being told he could not smoke in the room. ?Returned back to the ED later on 3/12 as GI symptoms had not resolved.  Again left AMA early morning of 3/13. ?Return back to the ED last evening 3/13.  Admitted by hospitalist. ? ?Recent labs over last few days:   ?K 3.2. Na 140.  No CKD, AKI. ?Hgb 7.4 .. 6.8..  5.9.  MCV 83.  WBCs 2.6.  Platelets 72 .. 38 K. ?Low iron 39, low ferritin 21. ?INR 1.1. ?T. bili 0.5.  Alk phos 70.  AST/ALT 124/128.  Lipase 124. ?Lactic acid 2. ? ?Still drinking alcohol. Three 1/5ths daily (750 mL x 3).  [facility]le[facility] at the homeless shelter.  Originally from [place] but moved to [place] about 20 years ago.  Has no close family or associates who could act as  HCPOA. ?Does not know his family history. ? ?Past Medical History:  ?Diagnosis Date  ? Alcohol withdrawal syndrome with complication ([place])   ? Alcoholism ([place])   ? Elevated AST (SGOT)   ? Homeless   ? Hypertension   ? Symptomatic anemia 11/23/2015  ? ? ?Past Surgical History:  ?Procedure Laterality Date  ? BIOPSY  06/23/2019  ? Procedure: BIOPSY;  Surgeon: Lavena Bullion, DO;  Location: [facility];  Service: Gastroenterology;;  ? BIOPSY  12/12/2020  ? Procedure: BIOPSY;  Surgeon: Jackquline Denmark, MD;  Location: [facility] ENDOSCOPY;  Service: Endoscopy;;  ? ESOPHAGOGASTRODUODENOSCOPY (EGD) WITH PROPOFOL N/A 06/23/2019  ? Procedure: ESOPHAGOGASTRODUODENOSCOPY (EGD) WITH PROPOFOL;  Surgeon: Lavena Bullion, DO;  Location: [facility];  Service: Gastroenterology;  Laterality: N/A;  ? ESOPHAGOGASTRODUODENOSCOPY (EGD) WITH PROPOFOL N/A 12/12/2020  ? Procedure: ESOPHAGOGASTRODUODENOSCOPY (EGD) WITH PROPOFOL;  Surgeon: Jackquline Denmark, MD;  Location: [facility] ENDOSCOPY;  Service: Endoscopy;  Laterality: N/A;  ? HOT HEMOSTASIS N/A 06/23/2019  ? Procedure: HOT HEMOSTASIS (ARGON PLASMA COAGULATION/BICAP);  Surgeon: Lavena Bullion, DO;  Location: [facility] ENDOSCOPY;  Service: Gastroenterology;  Laterality: N/A;  ? ? ?Prior to Admission  medications   ?Medication Sig Start Date End Date Taking? Authorizing Provider  ?acetaminophen (TYLENOL) 325 MG tablet Take 2 tablets (650 mg total) by mouth every 6 (six) hours as needed for fever, moderate pain, mild pain or headache. ?Patient not taking: Reported on 05/06/2021 12/12/20   Riesa Pope, MD  ?Ferrous Gluconate 239 (27 Fe) MG TABS Take 1 tablet by mouth every Monday, Wednesday, and Friday. ?Patient not taking: Reported on 05/06/2021 12/13/20   Riesa Pope, MD  ?folic acid (FOLVITE) 1 MG tablet Take 1 tablet (1 mg total) by mouth daily. ?Patient not taking: Reported on 05/06/2021 12/13/20   Riesa Pope, MD  ?lipase/protease/amylase (CREON) 36000 UNITS CPEP capsule Take  1 capsule (36,000 Units total) by mouth 3 (three) times daily before meals. ?Patient not taking: Reported on 05/06/2021 12/12/20   Riesa Pope, MD  ?mineral oil-hydrophilic petrolatum (AQUAPHOR) ointm

## 2021-05-08 NOTE — Progress Notes (Signed)
Per Gastroenterology Octreotide can be stopped tomorrow and protonix can be oral if hemoglobin is stable.  ?

## 2021-05-08 NOTE — Assessment & Plan Note (Signed)
-   This likely upper GI bleeding given his hematemesis. ?- He will be admitted to a PCU bed. ?- We will follow serial H&H. ?- We will continue him on IV Protonix drip. ?- GI consultation will be obtained. ?- I sent a notification to Dr. Loreta Ave. ?

## 2021-05-08 NOTE — Assessment & Plan Note (Signed)
-   We will place him on a banana bag daily. ?- He will be placed on CIWA protocol for potential alcohol withdrawal. ?

## 2021-05-08 NOTE — Assessment & Plan Note (Signed)
-   We will place the patient on IV Flagyl and Rocephin. ?

## 2021-05-08 NOTE — Assessment & Plan Note (Signed)
-   She has minimally elevated lipase. ?- We kept NPO. ?- We will continue his Creon. ?- We will follow lipase level. ?

## 2021-05-08 NOTE — H&P (Addendum)
?  ?  ?[place] ? ? ?PATIENT NAME: Samuel Blevins   ? ?MR#:  [id number] ? ?DATE OF BIRTH:  [birth year] ? ?DATE OF ADMISSION:  05/07/2021 ? ?PRIMARY CARE PHYSICIAN: Pcp, No  ? ?Patient is coming from: Home ? ?REQUESTING/REFERRING PHYSICIAN: Horton, Barbette Hair, MD ? ?CHIEF COMPLAINT:  ? ?Chief Complaint  ?Patient presents with  ? Hematemesis  ? ? ?HISTORY OF PRESENT ILLNESS:  ?Samuel Blevins is a 56 y.o. male with medical history significant for alcoholism and essential hypertension, who presented to the emergency room with acute onset of hematemesis.  He has been evaluated over the last couple of days and seen.  He was admitted here twice over the last couple of and left [facility] after potential plan for EGD.  Dr. Bryan Lemma with [facility] was notified about the patient yesterday a.m.  The patient's hemoglobin has been gradually dropping from 8.5-6.8 over the last couple of days.  He was typed and crossmatched and was ordered 1 unit of packed red blood cells yesterday that he refused.  He stated that he had to go to court before he left.  He had recurrent hematemesis after he went home.  His last alcoholic drink was at fifth prior to arrival.  He admits to upper abdominal pain.  He admitted to melena without bright red blood per rectum.  No fever or chills.  No dysuria, oliguria or hematuria or flank pain.  No chest pain or palpitations.  No other bleeding diathesis.  He has been having chills and diaphoresis.  He has been having ? ?ED Course: Upon presentation to the emergency room, temperature was 96.8 and vital signs otherwise were within normal.  Labs revealed mild hypokalemia of 3.4 and calcium of 8 with otherwise unremarkable BMP.  AST was 128 and lipase 124.  CBC showed hemoglobin of 6.8 and hematocrit 22 and WBC of 3.2 with platelets of 49.   ? ?Imaging: Portable chest ray showed no acute cardiopulmonary disease..  Abdominal pelvic CT ?Scan on 3/12 showed the following: ?1. Imaging findings of  gastroenteritis. No bowel obstruction or ?mesenteric inflammation. ?2. Prior peripancreatic edematous findings are not seen today. ?3. Moderate to severe hepatic steatosis. ?4. Ascending colitis versus nondistention.  Diverticulosis. ?5. Prominent prostate impressing on the bladder base with mild ?bladder thickening, likely hypertrophy and unchanged. ?6. Haziness in the right lower lobe, which could be due to ?respiratory motion artifact, microatelectasis or pneumonitis. ?7. Aortic atherosclerosis. ? ?The patient was placed on IV Protonix with bolus followed by drip as well as given 1 L bolus of IV normal saline.  It was given IV thiamine and was placed on CIWA protocol.  He continues to refuse PRBCs.  He agreed to stay to have EGD in AM.  He will be admitted to a PCU bed for further evaluation and management. ?PAST MEDICAL HISTORY:  ? ?Past Medical History:  ?Diagnosis Date  ? Alcohol withdrawal syndrome with complication ([place])   ? Alcoholism ([place])   ? Elevated AST (SGOT)   ? Homeless   ? Hypertension   ? Symptomatic anemia 11/23/2015  ? ? ?PAST SURGICAL HISTORY:  ? ?Past Surgical History:  ?Procedure Laterality Date  ? BIOPSY  06/23/2019  ? Procedure: BIOPSY;  Surgeon: Lavena Bullion, DO;  Location: [facility] ENDOSCOPY;  Service: Gastroenterology;;  ? BIOPSY  12/12/2020  ? Procedure: BIOPSY;  Surgeon: Jackquline Denmark, MD;  Location: [facility] ENDOSCOPY;  Service: Endoscopy;;  ? ESOPHAGOGASTRODUODENOSCOPY (EGD) WITH PROPOFOL N/A 06/23/2019  ?  Procedure: ESOPHAGOGASTRODUODENOSCOPY (EGD) WITH PROPOFOL;  Surgeon: Shellia Cleverly, DO;  Location: [facility] ENDOSCOPY;  Service: Gastroenterology;  Laterality: N/A;  ? ESOPHAGOGASTRODUODENOSCOPY (EGD) WITH PROPOFOL N/A 12/12/2020  ? Procedure: ESOPHAGOGASTRODUODENOSCOPY (EGD) WITH PROPOFOL;  Surgeon: Lynann Bologna, MD;  Location: [facility] ENDOSCOPY;  Service: Endoscopy;  Laterality: N/A;  ? HOT HEMOSTASIS N/A 06/23/2019  ? Procedure: HOT HEMOSTASIS (ARGON PLASMA COAGULATION/BICAP);  Surgeon:  Shellia Cleverly, DO;  Location: [facility] ENDOSCOPY;  Service: Gastroenterology;  Laterality: N/A;  ? ? ?SOCIAL HISTORY:  ? ?Social History  ? ?Tobacco Use  ? Smoking status: Every Day  ?  Packs/day: 1.00  ?  Years: 30.00  ?  Pack years: 30.00  ?  Types: Cigarettes  ? Smokeless tobacco: Never  ?Substance Use Topics  ? Alcohol use: Yes  ? ? ?FAMILY HISTORY:  ? ?Family History  ?Problem Relation Age of Onset  ? Diabetes Mellitus II Neg Hx   ? Colon cancer Neg Hx   ? Stomach cancer Neg Hx   ? Pancreatic cancer Neg Hx   ? ? ?DRUG ALLERGIES:  ?No Known Allergies ? ?REVIEW OF SYSTEMS:  ? ?ROS ?As per history of present illness. All pertinent systems were reviewed above. Constitutional, HEENT, cardiovascular, respiratory, GI, GU, musculoskeletal, neuro, psychiatric, endocrine, integumentary and hematologic systems were reviewed and are otherwise negative/unremarkable except for positive findings mentioned above in the HPI. ? ? ?MEDICATIONS AT HOME:  ? ?Prior to Admission medications   ?Medication Sig Start Date End Date Taking? Authorizing Provider  ?acetaminophen (TYLENOL) 325 MG tablet Take 2 tablets (650 mg total) by mouth every 6 (six) hours as needed for fever, moderate pain, mild pain or headache. ?Patient not taking: Reported on 05/06/2021 12/12/20   Belva Agee, MD  ?Ferrous Gluconate 239 (27 Fe) MG TABS Take 1 tablet by mouth every Monday, Wednesday, and Friday. ?Patient not taking: Reported on 05/06/2021 12/13/20   Belva Agee, MD  ?folic acid (FOLVITE) 1 MG tablet Take 1 tablet (1 mg total) by mouth daily. ?Patient not taking: Reported on 05/06/2021 12/13/20   Belva Agee, MD  ?lipase/protease/amylase (CREON) 36000 UNITS CPEP capsule Take 1 capsule (36,000 Units total) by mouth 3 (three) times daily before meals. ?Patient not taking: Reported on 05/06/2021 12/12/20   Belva Agee, MD  ?mineral oil-hydrophilic petrolatum (AQUAPHOR) ointment Apply 1 application topically 2 (two)  times daily. Apply to both nostrils twice a day ?Patient not taking: Reported on 05/06/2021 12/12/20   Belva Agee, MD  ?Multiple Vitamin (MULTIVITAMIN WITH MINERALS) TABS tablet Take 1 tablet by mouth daily. ?Patient not taking: Reported on 05/06/2021 12/13/20   Belva Agee, MD  ?oxymetazoline (AFRIN) 0.05 % nasal spray Place 3 sprays into both nostrils as needed (Bleeding). ?Patient not taking: Reported on 05/06/2021 12/12/20   Belva Agee, MD  ?pantoprazole (PROTONIX) 40 MG tablet Take 1 tablet (40 mg total) by mouth daily at 6 (six) AM. ?Patient not taking: Reported on 05/06/2021 12/13/20   Belva Agee, MD  ?sodium chloride (OCEAN) 0.65 % SOLN nasal spray Place 2 sprays into both nostrils every 4 (four) hours while awake. ?Patient not taking: Reported on 05/06/2021 12/12/20   Belva Agee, MD  ?sucralfate (CARAFATE) 1 g tablet Take 1 tablet (1 g total) by mouth 4 (four) times daily -  with meals and at bedtime. ?Patient not taking: Reported on 05/06/2021 01/06/21   Geoffery Lyons, MD  ?thiamine 100 MG tablet Take 1 tablet (100 mg total) by mouth daily. ?Patient not taking: Reported on 05/06/2021 12/13/20  Katsadouros, Vasilios, MD  ?thiamine 100 MG tablet Take 1 tablet (100 mg total) by mouth daily. ?Patient not taking: Reported on 05/06/2021 12/12/20   Riesa Pope, MD  ? ?  ? ?VITAL SIGNS:  ?Blood pressure 123/76, pulse (!) 109, temperature 98 ?F (36.7 ?C), temperature source Oral, resp. rate 19, SpO2 94 %. ? ?PHYSICAL EXAMINATION:  ?Physical Exam ? ?GENERAL:  56 y.o.-year-old African-American male patient lying in the bed with no acute distress.  ?EYES: Pupils equal, round, reactive to light and accommodation. No scleral icterus.  Positive pallor.  Extraocular muscles intact.  ?HEENT: Head atraumatic, normocephalic. Oropharynx and nasopharynx clear.  ?NECK:  Supple, no jugular venous distention. No thyroid enlargement, no tenderness.  ?LUNGS: Normal breath  sounds bilaterally, no wheezing, rales,rhonchi or crepitation. No use of accessory muscles of respiration.  ?CARDIOVASCULAR: Regular rate and rhythm, S1, S2 normal. No murmurs, rubs, or gallops.  ?ABDOMEN: Soft, no

## 2021-05-08 NOTE — ED Provider Notes (Signed)
?[facility] EMERGENCY DEPARTMENT ?Provider Note ? ? ?CSN: [id number] ?Arrival date & time: 05/07/21  1954 ? ?  ? ?History ? ?Chief Complaint  ?Patient presents with  ? Hematemesis  ? ? ?Samuel Blevins is a 56 y.o. male. ? ?HPI ? ?  ? ?This is a 56 year old male with a history of alcohol abuse and hematemesis who presents with the same.  He has been seen and evaluated in the last 2 days.  On both occasions he was admitted to the hospital then left AGAINST MEDICAL ADVICE.  He left as early as this morning when he was supposed to get a blood transfusion but refused.  He states "I had to go to court."  He states that he last drank just prior to arrival.  He reports ongoing hematemesis.  Reports upper abdominal pain.   ? ?Patient's chart reviewed.  He was initially evaluated by [facility].  Additionally, he was recommended to have a blood transfusion this morning.  He adamantly refuses blood transfusion.  He understands that if he does not receive a blood transfusion and were to have a massive hemorrhage, he could die.  He states "I would rather die."  Reports ongoing drinking.  Reports history of alcohol withdrawal. ? ?Home Medications ?Prior to Admission medications   ?Medication Sig Start Date End Date Taking? Authorizing Provider  ?acetaminophen (TYLENOL) 325 MG tablet Take 2 tablets (650 mg total) by mouth every 6 (six) hours as needed for fever, moderate pain, mild pain or headache. ?Patient not taking: Reported on 05/06/2021 12/12/20   Belva Agee, MD  ?Ferrous Gluconate 239 (27 Fe) MG TABS Take 1 tablet by mouth every Monday, Wednesday, and Friday. ?Patient not taking: Reported on 05/06/2021 12/13/20   Belva Agee, MD  ?folic acid (FOLVITE) 1 MG tablet Take 1 tablet (1 mg total) by mouth daily. ?Patient not taking: Reported on 05/06/2021 12/13/20   Belva Agee, MD  ?lipase/protease/amylase (CREON) 36000 UNITS CPEP capsule Take 1 capsule (36,000 Units total) by mouth 3  (three) times daily before meals. ?Patient not taking: Reported on 05/06/2021 12/12/20   Belva Agee, MD  ?mineral oil-hydrophilic petrolatum (AQUAPHOR) ointment Apply 1 application topically 2 (two) times daily. Apply to both nostrils twice a day ?Patient not taking: Reported on 05/06/2021 12/12/20   Belva Agee, MD  ?Multiple Vitamin (MULTIVITAMIN WITH MINERALS) TABS tablet Take 1 tablet by mouth daily. ?Patient not taking: Reported on 05/06/2021 12/13/20   Belva Agee, MD  ?oxymetazoline (AFRIN) 0.05 % nasal spray Place 3 sprays into both nostrils as needed (Bleeding). ?Patient not taking: Reported on 05/06/2021 12/12/20   Belva Agee, MD  ?pantoprazole (PROTONIX) 40 MG tablet Take 1 tablet (40 mg total) by mouth daily at 6 (six) AM. ?Patient not taking: Reported on 05/06/2021 12/13/20   Belva Agee, MD  ?sodium chloride (OCEAN) 0.65 % SOLN nasal spray Place 2 sprays into both nostrils every 4 (four) hours while awake. ?Patient not taking: Reported on 05/06/2021 12/12/20   Belva Agee, MD  ?sucralfate (CARAFATE) 1 g tablet Take 1 tablet (1 g total) by mouth 4 (four) times daily -  with meals and at bedtime. ?Patient not taking: Reported on 05/06/2021 01/06/21   Geoffery Lyons, MD  ?thiamine 100 MG tablet Take 1 tablet (100 mg total) by mouth daily. ?Patient not taking: Reported on 05/06/2021 12/13/20   Belva Agee, MD  ?thiamine 100 MG tablet Take 1 tablet (100 mg total) by mouth daily. ?Patient not taking: Reported on 05/06/2021 12/12/20  Belva AgeeKatsadouros, Vasilios, MD  ?   ? ?Allergies    ?Patient has no known allergies.   ? ?Review of Systems   ?Review of Systems  ?Gastrointestinal:  Positive for abdominal pain.  ?     Hematemesis  ?All other systems reviewed and are negative. ? ?Physical Exam ?Updated Vital Signs ?BP 140/78 (BP Location: Right Arm)   Pulse 89   Temp (!) 96.8 ?F (36 ?C) (Oral)   Resp 18   SpO2 97%  ?Physical Exam ?Vitals and nursing  note reviewed.  ?Constitutional:   ?   Appearance: He is well-developed.  ?   Comments: Appears intoxicated  ?HENT:  ?   Head: Normocephalic and atraumatic.  ?   Mouth/Throat:  ?   Mouth: Mucous membranes are dry.  ?Eyes:  ?   Pupils: Pupils are equal, round, and reactive to light.  ?Cardiovascular:  ?   Rate and Rhythm: Normal rate and regular rhythm.  ?   Heart sounds: Normal heart sounds. No murmur heard. ?Pulmonary:  ?   Effort: Pulmonary effort is normal. No respiratory distress.  ?   Breath sounds: Normal breath sounds. No wheezing.  ?Abdominal:  ?   General: Bowel sounds are normal.  ?   Palpations: Abdomen is soft.  ?   Tenderness: There is abdominal tenderness. There is no rebound.  ?Musculoskeletal:  ?   Cervical back: Neck supple.  ?Lymphadenopathy:  ?   Cervical: No cervical adenopathy.  ?Skin: ?   General: Skin is warm and dry.  ?Neurological:  ?   Mental Status: He is alert and oriented to person, place, and time.  ?Psychiatric:  ?   Comments: Irritable  ? ? ?ED Results / Procedures / Treatments   ?Labs ?(all labs ordered are listed, but only abnormal results are displayed) ?Labs Reviewed  ?COMPREHENSIVE METABOLIC PANEL - Abnormal; Notable for the following components:  ?    Result Value  ? Potassium 3.4 (*)   ? BUN 5 (*)   ? Calcium 8.0 (*)   ? AST 128 (*)   ? All other components within normal limits  ?CBC WITH DIFFERENTIAL/PLATELET - Abnormal; Notable for the following components:  ? WBC 3.2 (*)   ? RBC 2.62 (*)   ? Hemoglobin 6.8 (*)   ? HCT 22.0 (*)   ? RDW 21.5 (*)   ? Platelets 49 (*)   ? Neutro Abs 1.6 (*)   ? All other components within normal limits  ?LIPASE, BLOOD - Abnormal; Notable for the following components:  ? Lipase 124 (*)   ? All other components within normal limits  ?RESP PANEL BY RT-PCR (FLU A&B, COVID) [id number]  ?TYPE AND SCREEN  ? ? ?EKG ?None ? ?Radiology ?CT Abdomen Pelvis W Contrast ? ?Result Date: 05/06/2021 ?CLINICAL DATA:  Nausea, vomiting and abdominal pain with previous  history of pancreatitis. Hematemesis x1 week with 2 episodes in the past 24 hours. EXAM: CT ABDOMEN AND PELVIS WITH CONTRAST TECHNIQUE: Multidetector CT imaging of the abdomen and pelvis was performed using the standard protocol following bolus administration of intravenous contrast. RADIATION DOSE REDUCTION: This exam was performed according to the departmental dose-optimization program which includes automated exposure control, adjustment of the mA and/or kV according to patient size and/or use of iterative reconstruction technique. CONTRAST:  100mL OMNIPAQUE IOHEXOL 300 MG/ML  SOLN COMPARISON:  CTs with IV contrast 12/07/2020 and 09/16/2020. FINDINGS: Lower chest: There is respiratory motion on exam. Asymmetric haziness in the right lower lobe is  seen and could be breathing motion artifact, asymmetric microatelectasis, or pneumonitis. Lung bases are otherwise clear.  The cardiac size is normal. Hepatobiliary: 20.3 cm length liver with moderate to severe steatosis. No portal vein dilatation. Gallbladder and bile ducts unremarkable. 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 Areas of focal edema in the head and uncinate processes of the pancreas on the previous exam are also no longer seen. There is no mass enhancement or ductal dilatation. Spleen: Normal in size with uniform enhancement. Adrenals/Urinary Tract: There is no adrenal mass or focal abnormality of the renal cortex. There is no evidence of urinary stones or obstruction. The bladder is mildly thickened but this was also seen on prior studies. Stomach/Bowel: The gastric wall is moderately thickened but no more than previously. Similar moderate thickening of the descending duodenum as well. There are mild thickened folds in some of the left abdominal small bowel but there is no small bowel obstruction or inflammation. Appendix is not seen in this patient. There  is mild wall prominence versus underdistention of the ascending colon. There are colonic diverticula without evidence of focal inflammation. Vascular/Lymphatic: Moderate aortoiliac calcific plaque. No AAA. No

## 2021-05-09 DIAGNOSIS — D696 Thrombocytopenia, unspecified: Secondary | ICD-10-CM

## 2021-05-09 DIAGNOSIS — D72819 Decreased white blood cell count, unspecified: Secondary | ICD-10-CM

## 2021-05-09 DIAGNOSIS — K2920 Alcoholic gastritis without bleeding: Secondary | ICD-10-CM | POA: Diagnosis not present

## 2021-05-09 HISTORY — DX: Thrombocytopenia, unspecified: D69.6

## 2021-05-09 LAB — COMPREHENSIVE METABOLIC PANEL
ALT: 34 U/L (ref 0–44)
AST: 74 U/L — ABNORMAL HIGH (ref 15–41)
Albumin: 3.2 g/dL — ABNORMAL LOW (ref 3.5–5.0)
Alkaline Phosphatase: 81 U/L (ref 38–126)
Anion gap: 12 (ref 5–15)
BUN: <5 mg/dL — ABNORMAL LOW (ref 6–20)
CO2: 25 mmol/L (ref 22–32)
Calcium: 8 mg/dL — ABNORMAL LOW (ref 8.9–10.3)
Chloride: 96 mmol/L — ABNORMAL LOW (ref 98–111)
Creatinine, Ser: 0.7 mg/dL (ref 0.61–1.24)
GFR, Estimated: >60 mL/min (ref >60)
Glucose, Bld: 107 mg/dL — ABNORMAL HIGH (ref 70–99)
Potassium: 3.6 mmol/L (ref 3.5–5.1)
Sodium: 133 mmol/L — ABNORMAL LOW (ref 135–145)
Total Bilirubin: 1.6 mg/dL — ABNORMAL HIGH (ref 0.3–1.2)
Total Protein: 7.1 g/dL (ref 6.5–8.1)

## 2021-05-09 LAB — CBC
HCT: 20.4 % — ABNORMAL LOW (ref 39.0–52.0)
Hemoglobin: 6.4 g/dL — CL (ref 13.0–17.0)
MCH: 26 pg (ref 26.0–34.0)
MCHC: 31.4 g/dL (ref 30.0–36.0)
MCV: 82.9 fL (ref 80.0–100.0)
Platelets: 40 10*3/uL — ABNORMAL LOW (ref 150–400)
RBC: 2.46 MIL/uL — ABNORMAL LOW (ref 4.22–5.81)
RDW: 21 % — ABNORMAL HIGH (ref 11.5–15.5)
WBC: 4 10*3/uL (ref 4.0–10.5)
nRBC: 0.7 % — ABNORMAL HIGH (ref 0.0–0.2)

## 2021-05-09 LAB — METHYLMALONIC ACID, SERUM: Methylmalonic Acid, Quantitative: 96 nmol/L (ref 0–378)

## 2021-05-09 MED ORDER — LORAZEPAM 2 MG/ML IJ SOLN
1.0000 mg | INTRAMUSCULAR | Status: AC | PRN
  Administered 2021-05-09 – 2021-05-10 (×5): 2 mg via INTRAVENOUS
  Administered 2021-05-10: 3 mg via INTRAVENOUS
  Administered 2021-05-11: 2 mg via INTRAVENOUS
  Administered 2021-05-11: 4 mg via INTRAVENOUS
  Administered 2021-05-11: 2 mg via INTRAVENOUS
  Filled 2021-05-09: qty 1
  Filled 2021-05-09: qty 2
  Filled 2021-05-09 (×3): qty 1
  Filled 2021-05-09: qty 2

## 2021-05-09 MED ORDER — THIAMINE HCL 100 MG PO TABS: 100.0000 mg | ORAL_TABLET | Freq: Every day | ORAL | Status: DC

## 2021-05-09 MED ORDER — SODIUM CHLORIDE 0.9 % IV SOLN
1.0000 g | INTRAVENOUS | Status: AC
  Administered 2021-05-09 – 2021-05-10 (×2): 1 g via INTRAVENOUS
  Filled 2021-05-09 (×2): qty 10

## 2021-05-09 MED ORDER — ADULT MULTIVITAMIN W/MINERALS CH
1.0000 | ORAL_TABLET | Freq: Every day | ORAL | Status: DC
  Administered 2021-05-09 – 2021-05-13 (×4): 1 via ORAL
  Filled 2021-05-09 (×4): qty 1

## 2021-05-09 MED ORDER — LORAZEPAM 1 MG PO TABS
1.0000 mg | ORAL_TABLET | ORAL | Status: AC | PRN
  Administered 2021-05-10: 1 mg via ORAL
  Filled 2021-05-09: qty 4

## 2021-05-09 MED ORDER — THIAMINE HCL 100 MG/ML IJ SOLN: 100.0000 mg | Freq: Every day | INTRAMUSCULAR | Status: DC

## 2021-05-09 MED ORDER — FOLIC ACID 1 MG PO TABS
1.0000 mg | ORAL_TABLET | Freq: Every day | ORAL | Status: DC
  Administered 2021-05-09 – 2021-05-13 (×4): 1 mg via ORAL
  Filled 2021-05-09 (×4): qty 1

## 2021-05-09 NOTE — Progress Notes (Signed)
Patient is anxious to leave. He has pulled out one IV and he asked if I could get him some clothes. He wants to make a phone call for someone to pick him up but I told him we will call them tomorrow for a ride.  ?

## 2021-05-09 NOTE — Progress Notes (Signed)
MD said patient wants to leave tomorrow and he is ok with that because we cant hold him here.  ?

## 2021-05-09 NOTE — Evaluation (Signed)
Physical Therapy Evaluation ?Patient Details ?Name: Samuel Blevins ?MRN: [id number] ?DOB: Jul 29, 1965 ?Today's Date: 05/09/2021 ? ?History of Present Illness ? 56 y.o. male presents to [facility] hospital on 05/09/2021 with acute onset hematemesis and anemia. PMH includes alcoholism and HTN.  ?Clinical Impression ? Pt presents to PT with presumed deficits in functional mobility, gait, balance, strength, power. Pt is very difficult to understand and no visitors are present to provide history. Pt seems to report being independent, he also reports living in a 2 level home (RN reports pt lives at a homeless shelter). Pt currently requires significant physical assistance to perform bed mobility due to weakness and imbalance. Pt also experiences cramping in hand during session. Pt will benefit from acute PT services in an effort to reduce falls risk and caregiver burden. PT recommends SNF placement as the pt is at a high risk for falls. Pt would need significant physical assistance for all mobility in order to safely return to the community. He would likely benefit from receiving a manual wheelchair if refusing SNF placement.   ?   ? ?Recommendations for follow up therapy are one component of a multi-disciplinary discharge planning process, led by the attending physician.  Recommendations may be updated based on patient status, additional functional criteria and insurance authorization. ? ?Follow Up Recommendations Skilled nursing-short term rehab (<3 hours/day) ? ?  ?Assistance Recommended at Discharge Frequent or constant Supervision/Assistance  ?Patient can return home with the following ? A lot of help with walking and/or transfers;A lot of help with bathing/dressing/bathroom;Assistance with cooking/housework;Assistance with feeding;Direct supervision/assist for medications management;Assist for transportation;Direct supervision/assist for financial management;Help with stairs or ramp for entrance ? ?  ?Equipment Recommendations  Wheelchair (measurements PT);Wheelchair cushion (measurements PT)  ?Recommendations for Other Services ?    ?  ?Functional Status Assessment Patient has had a recent decline in their functional status and demonstrates the ability to make significant improvements in function in a reasonable and predictable amount of time.  ? ?  ?Precautions / Restrictions Precautions ?Precautions: Fall ?Restrictions ?Weight Bearing Restrictions: No  ? ?  ? ?Mobility ? Bed Mobility ?Overal bed mobility: Needs Assistance ?Bed Mobility: Supine to Sit, Sit to Supine ?  ?  ?Supine to sit: Max assist, HOB elevated ?Sit to supine: Min assist ?  ?General bed mobility comments: pt impulsively returns himself to supine ?  ? ?Transfers ?  ?  ?  ?  ?  ?  ?  ?  ?  ?  ?  ? ?Ambulation/Gait ?  ?  ?  ?  ?  ?  ?  ?  ? ?Stairs ?  ?  ?  ?  ?  ? ?Wheelchair Mobility ?  ? ?Modified Rankin (Stroke Patients Only) ?  ? ?  ? ?Balance Overall balance assessment: Needs assistance ?Sitting-balance support: No upper extremity supported, Feet supported ?Sitting balance-Leahy Scale: Fair ?  ?  ?  ?  ?  ?  ?  ?  ?  ?  ?  ?  ?  ?  ?  ?  ?   ? ? ? ?Pertinent Vitals/Pain Pain Assessment ?Pain Assessment: Faces ?Faces Pain Scale: Hurts a little bit ?Pain Location: generalized ?Pain Descriptors / Indicators: Grimacing ?Pain Intervention(s): Monitored during session  ? ? ?Home Living Family/patient expects to be discharged to:: Private residence ?Living Arrangements: Other relatives ?Available Help at Discharge: Family (sister) ?Type of Home: House ?Home Access:  (unsure) ?  ?  ?Alternate Level Stairs-Number of Steps: unsure ?Home  Layout: Two level ?  ?Additional Comments: PT unable to understand pt well during session so history is limited. RN reports pt lives in a homeless shelter  ?  ?Prior Function Prior Level of Function :  (difficult to understand the patient. PT assumes pt is independent at baseline if living at a homeless shelter) ?  ?  ?  ?  ?  ?  ?  ?  ?   ? ? ?Hand Dominance  ?   ? ?  ?Extremity/Trunk Assessment  ? Upper Extremity Assessment ?Upper Extremity Assessment: Generalized weakness (ataxic movements of BUE, pt reports cramping of finger adductors) ?  ? ?Lower Extremity Assessment ?Lower Extremity Assessment: Generalized weakness ?  ? ?Cervical / Trunk Assessment ?Cervical / Trunk Assessment: Normal  ?Communication  ? Communication: Other (comment) (garbled speech, very difficult to understand)  ?Cognition Arousal/Alertness: Awake/alert ?Behavior During Therapy: Impulsive ?Overall Cognitive Status: Impaired/Different from baseline ?Area of Impairment: Awareness, Problem solving ?  ?  ?  ?  ?  ?  ?  ?  ?  ?  ?  ?  ?  ?Awareness: Intellectual ?Problem Solving: Slow processing ?  ?  ?  ? ?  ?General Comments General comments (skin integrity, edema, etc.): tachycardia up to 120 observed during session ? ?  ?Exercises    ? ?Assessment/Plan  ?  ?PT Assessment Patient needs continued PT services  ?PT Problem List Decreased strength;Decreased activity tolerance;Decreased balance;Decreased mobility;Decreased coordination;Decreased safety awareness;Decreased knowledge of precautions;Cardiopulmonary status limiting activity ? ?   ?  ?PT Treatment Interventions DME instruction;Gait training;Functional mobility training;Therapeutic activities;Therapeutic exercise;Stair training;Neuromuscular re-education;Balance training;Cognitive remediation;Patient/family education;Wheelchair mobility training   ? ?PT Goals (Current goals can be found in the Care Plan section)  ?Acute Rehab PT Goals ?Patient Stated Goal: PT goal to return to independent mobility (PT unable to consistently understand patient) ?PT Goal Formulation: Patient unable to participate in goal setting ?Time For Goal Achievement: 05/23/21 ?Potential to Achieve Goals: Fair ? ?  ?Frequency Min 2X/week ?  ? ? ?Co-evaluation   ?  ?  ?  ?  ? ? ?  ?AM-PAC PT "6 Clicks" Mobility  ?Outcome Measure Help needed turning  from your back to your side while in a flat bed without using bedrails?: A Lot ?Help needed moving from lying on your back to sitting on the side of a flat bed without using bedrails?: A Lot ?Help needed moving to and from a bed to a chair (including a wheelchair)?: Total ?Help needed standing up from a chair using your arms (e.g., wheelchair or bedside chair)?: Total ?Help needed to walk in hospital room?: Total ?Help needed climbing 3-5 steps with a railing? : Total ?6 Click Score: 8 ? ?  ?End of Session   ?Activity Tolerance: Other (comment) (difficult to assess pt symptoms due to garbled speech) ?Patient left: in bed;with call bell/phone within reach;with bed alarm set ?Nurse Communication: Mobility status ?PT Visit Diagnosis: Other abnormalities of gait and mobility (R26.89);Muscle weakness (generalized) (M62.81);Other symptoms and signs involving the nervous system (R29.898) ?  ? ?Time: 2355-7322 ?PT Time Calculation (min) (ACUTE ONLY): 18 min ? ? ?Charges:   PT Evaluation ?$PT Eval Low Complexity: 1 Low ?  ?  ?   ? ? ?Arlyss Gandy, PT, DPT ?Acute Rehabilitation ?Pager: [phone] ?Office [phone] ? ? ?Arlyss Gandy ?05/09/2021, 2:50 PM ? ?

## 2021-05-09 NOTE — Progress Notes (Signed)
?PROGRESS NOTE ? ?Samuel Blevins ZOX:[id number] DOB: 11/17/1965 DOA: 05/07/2021 ?PCP: Pcp, No ? ? LOS: 1 day  ? ?Brief Narrative / Interim history: ?56 year old male with alcoholism, HTN, who comes to the hospital with hematemesis, abdominal pain.  He was several times in the ED in the last few days but always left AMA.  He continued to feel sick at home and came back to the hospital.  He was found to be anemic with refused blood transfusions.  GI consulted but there are no plans for endoscopic interventions at this point because he is at high risk for complications related to anesthesia and procedure due to severe anemia, and he is refusing to get blood transfusions. ? ?Subjective / 24h Interval events: ?Appears tremulous, tells me he is doing a little bit better. ? ?Assesement and Plan: ?Principal Problem: ?  GI bleeding ?Active Problems: ?  Acute on chronic anemia ?  Alcohol dependence syndrome (HCC) ?  Colitis, acute ?  Pancreatic insufficiency ?  Tobacco abuse ?  Acute bronchitis ? ?Assessment and Plan: ?Principal problem ?GI bleeding-This likely upper GI bleeding given his hematemesis.  GI consulted, no plans for endoscopic intervention due to high risk procedure.  Seems to have stopped bleeding, hemoglobin is stable, continue Protonix, would like to maintain on IV Protonix for at least 48 hours until tomorrow.  Advance diet as tolerated ? ?Active problems ?Acute on chronic blood loss anemia -This is acute blood loss anemia.  Unfortunately he continues to refuse blood transfusions.  Hemoglobin overall appears to be stable but he is at high risk of complications due to being persistently less than 7.  He also refused IV iron  ? ?Thrombocytopenia-due to EtOH use ? ?Leukopenia-improving, WBC normalized ? ?Questionable gastroenteritis-no significant symptoms.  Monitor off antibiotics ? ?Alcohol dependence, alcohol induced hepatic steatosis -continue CIWA.  Discriminant function less than 32, he has no evidence of  ascites on recent imaging and no varices on EGD 6 months ago ? ?Pancreatic insufficiency - minimally elevated lipase. Continue his Creon. ? ?Tobacco abuse -He was counseled for smoking cessation  ? ?Scheduled Meds: ? guaiFENesin  600 mg Oral BID  ? lipase/protease/amylase  36,000 Units Oral TID AC  ? LORazepam  0-4 mg Intravenous Q6H  ? Or  ? LORazepam  0-4 mg Oral Q6H  ? [START ON 05/10/2021] LORazepam  0-4 mg Intravenous Q12H  ? Or  ? [START ON 05/10/2021] LORazepam  0-4 mg Oral Q12H  ? mouth rinse  15 mL Mouth Rinse BID  ? thiamine  100 mg Oral Daily  ? Or  ? thiamine  100 mg Intravenous Daily  ? ?Continuous Infusions: ? pantoprazole (PROTONIX) IV 80 mg (05/09/21 1125)  ? ?PRN Meds:.acetaminophen **OR** acetaminophen, ipratropium-albuterol, morphine injection, ondansetron **OR** ondansetron (ZOFRAN) IV, traZODone ? ?Diet Orders (From admission, onward)  ? ?  Start     Ordered  ? 05/08/21 1243  Diet 2 gram sodium Room service appropriate? Yes; Fluid consistency: Thin  Diet effective now       ?Question Answer Comment  ?Room service appropriate? Yes   ?Fluid consistency: Thin   ?  ? 05/08/21 1242  ? ?  ?  ? ?  ? ? ?DVT prophylaxis: SCDs Start: 05/08/21 0318 ? ? ?Lab Results  ?Component Value Date  ? PLT 40 (L) 05/09/2021  ? ? ?  Code Status: Full Code ? ?Family Communication: no family at bedside  ? ?Status is: Inpatient ? ?Remains inpatient appropriate because: CIWA ? ?Level of  care: Progressive ? ?Consultants:  ?GI ? ?Procedures:  ?none ? ?Microbiology  ?none ? ?Antimicrobials: ?none  ? ? ?Objective: ?Vitals:  ? 05/09/21 0311 05/09/21 0400 05/09/21 0714 05/09/21 1037  ?BP: (!) 133/96  134/64 (!) 152/73  ?Pulse: (!) 104 97 96 (!) 105  ?Resp: 18 19 19 20   ?Temp: 99.5 ?F (37.5 ?C)  98.2 ?F (36.8 ?C) 99.2 ?F (37.3 ?C)  ?TempSrc: Oral  Oral Oral  ?SpO2: 98%  96% 95%  ? ? ?Intake/Output Summary (Last 24 hours) at 05/09/2021 1330 ?Last data filed at 05/09/2021 05/11/2021 ?Gross per 24 hour  ?Intake 1629.33 ml  ?Output 1650 ml   ?Net -20.67 ml  ? ?Wt Readings from Last 3 Encounters:  ?05/05/21 75.3 kg  ?12/12/20 75.3 kg  ?12/04/20 74.8 kg  ? ? ?Examination: ? ?Constitutional: NAD, tremulous ?Eyes: no scleral icterus ?ENMT: Mucous membranes are moist.  ?Neck: normal, supple ?Respiratory: clear to auscultation bilaterally, no wheezing, no crackles. ?Cardiovascular: Regular rate and rhythm, no murmurs / rubs / gallops. No LE edema.  ?Abdomen: non distended, no tenderness. Bowel sounds positive.  ?Musculoskeletal: no clubbing / cyanosis.  ?Skin: no rashes ?Neurologic: Nonfocal ? ? ?Data Reviewed: I have independently reviewed following labs and imaging studies  ? ?CBC ?Recent Labs  ?Lab 05/05/21 ?2216 05/06/21 ?07/06/21 05/06/21 ?2114 05/07/21 ?0352 05/07/21 ?2057 05/08/21 ?0459 05/08/21 ?05/10/21 05/08/21 ?1344 05/09/21 ?0721  ?WBC 3.3* 2.0* 2.7* 2.4* 3.2* 2.6*  --  3.6* 4.0  ?HGB 7.3* 7.1* 7.4* 6.7* 6.8* 5.9*  --  6.2* 6.4*  ?HCT 24.7* 23.1* 25.0* 21.8* 22.0* 19.4*  --  21.1* 20.4*  ?PLT 72* 57* 60* 49* 49* 40* 38* 38* 40*  ?MCV 83.7 82.2 83.3 83.5 84.0 82.9  --  84.1 82.9  ?MCH 24.7* 25.3* 24.7* 25.7* 26.0 25.2*  --  24.7* 26.0  ?MCHC 29.6* 30.7 29.6* 30.7 30.9 30.4  --  29.4* 31.4  ?RDW 21.4* 21.3* 21.2* 21.5* 21.5* 21.2*  --  21.2* 21.0*  ?LYMPHSABS 1.2 1.1 1.1 1.0 1.2  --   --   --   --   ?MONOABS 0.3 0.2 0.3 0.2 0.3  --   --   --   --   ?EOSABS 0.0 0.0 0.0 0.0 0.0  --   --   --   --   ?BASOSABS 0.0 0.0 0.0 0.0 0.0  --   --   --   --   ? ? ?Recent Labs  ?Lab 05/05/21 ?2216 05/06/21 ?07/06/21 05/06/21 ?2114 05/07/21 ?0352 05/07/21 ?2057 05/08/21 ?0459 05/08/21 ?05/10/21 05/09/21 ?05/11/21  ?NA 138 137 138 141 138 140  --  133*  ?K 3.2* 3.3* 3.4* 3.3* 3.4* 3.2*  --  3.6  ?CL 99 100 101 105 103 107  --  96*  ?CO2 22 22 23 26 26 25   --  25  ?GLUCOSE 103* 78 96 86 88 80  --  107*  ?BUN 10 8 8 7  5* <5*  --  <5*  ?CREATININE 0.81 0.67 0.77 0.60* 0.72 0.65  --  0.70  ?CALCIUM 8.8* 8.2* 8.7* 7.9* 8.0* 7.5*  --  8.0*  ?AST 148* 124* 108* 92* 128* 126*  --  74*  ?ALT  46* 41 41 35 41 39  --  34  ?ALKPHOS 88 79 88 71 79 70  --  81  ?BILITOT 0.7 0.5 0.6 0.5 0.5 0.5  --  1.6*  ?ALBUMIN 3.7 3.3* 3.7 3.2* 3.6 3.1*  --  3.2*  ?MG  --  1.5*  --  1.4*  --   --  1.3*  --   ?DDIMER  --   --   --   --   --   --  1.11*  --   ?INR 1.0 1.0  --  1.0  --  1.0 1.1  --   ? ? ?------------------------------------------------------------------------------------------------------------------ ?No results for input(s): CHOL, HDL, LDLCALC, TRIG, CHOLHDL, LDLDIRECT in the last 72 hours. ? ?No results found for: HGBA1C ?------------------------------------------------------------------------------------------------------------------ ?No results for input(s): TSH, T4TOTAL, T3FREE, THYROIDAB in the last 72 hours. ? ?Invalid input(s): FREET3 ? ?Cardiac Enzymes ?No results for input(s): CKMB, TROPONINI, MYOGLOBIN in the last 168 hours. ? ?Invalid input(s): CK ?------------------------------------------------------------------------------------------------------------------ ?   ?Component Value Date/Time  ? BNP 286.7 (H) 11/23/2015 0221  ? ? ?CBG: ?No results for input(s): GLUCAP in the last 168 hours. ? ?Recent Results (from the past 240 hour(s))  ?Resp Panel by RT-PCR (Flu A&B, Covid) Nasopharyngeal Swab     Status: None  ? Collection Time: 05/06/21  6:48 AM  ? Specimen: Nasopharyngeal Swab; Nasopharyngeal(NP) swabs in vial transport medium  ?Result Value Ref Range Status  ? SARS Coronavirus 2 by RT PCR NEGATIVE NEGATIVE Final  ?  Comment: (NOTE) ?SARS-CoV-2 target nucleic acids are NOT DETECTED. ? ?The SARS-CoV-2 RNA is generally detectable in upper respiratory ?specimens during the acute phase of infection. The lowest ?concentration of SARS-CoV-2 viral copies this assay can detect is ?138 copies/mL. A negative result does not preclude SARS-Cov-2 ?infection and should not be used as the sole basis for treatment or ?other patient management decisions. A negative result may occur with  ?improper specimen  collection/handling, submission of specimen other ?than nasopharyngeal swab, presence of viral mutation(s) within the ?areas targeted by this assay, and inadequate number of viral ?copies(<138 copies/mL). A ne

## 2021-05-10 DIAGNOSIS — K2920 Alcoholic gastritis without bleeding: Secondary | ICD-10-CM | POA: Diagnosis not present

## 2021-05-10 LAB — BASIC METABOLIC PANEL
Anion gap: 11 (ref 5–15)
BUN: <5 mg/dL — ABNORMAL LOW (ref 6–20)
CO2: 24 mmol/L (ref 22–32)
Calcium: 8.6 mg/dL — ABNORMAL LOW (ref 8.9–10.3)
Chloride: 98 mmol/L (ref 98–111)
Creatinine, Ser: 0.71 mg/dL (ref 0.61–1.24)
GFR, Estimated: >60 mL/min (ref >60)
Glucose, Bld: 90 mg/dL (ref 70–99)
Potassium: 3.4 mmol/L — ABNORMAL LOW (ref 3.5–5.1)
Sodium: 133 mmol/L — ABNORMAL LOW (ref 135–145)

## 2021-05-10 LAB — CBC
HCT: 21.6 % — ABNORMAL LOW (ref 39.0–52.0)
Hemoglobin: 6.7 g/dL — CL (ref 13.0–17.0)
MCH: 25.7 pg — ABNORMAL LOW (ref 26.0–34.0)
MCHC: 31 g/dL (ref 30.0–36.0)
MCV: 82.8 fL (ref 80.0–100.0)
Platelets: 52 10*3/uL — ABNORMAL LOW (ref 150–400)
RBC: 2.61 MIL/uL — ABNORMAL LOW (ref 4.22–5.81)
RDW: 21.6 % — ABNORMAL HIGH (ref 11.5–15.5)
WBC: 3.3 10*3/uL — ABNORMAL LOW (ref 4.0–10.5)
nRBC: 0 % (ref 0.0–0.2)

## 2021-05-10 MED ORDER — POTASSIUM CHLORIDE CRYS ER 20 MEQ PO TBCR
40.0000 meq | EXTENDED_RELEASE_TABLET | Freq: Once | ORAL | Status: AC
  Administered 2021-05-10: 40 meq via ORAL
  Filled 2021-05-10: qty 2

## 2021-05-10 NOTE — Progress Notes (Signed)
?PROGRESS NOTE ? ?Samuel Blevins YWV:[id number] DOB: 09/25/1965 DOA: 05/07/2021 ?PCP: Pcp, No ? ? LOS: 2 days  ? ?Brief Narrative / Interim history: ?56 year old male with alcoholism, HTN, who comes to the hospital with hematemesis, abdominal pain.  He was several times in the ED in the last few days but always left AMA.  He continued to feel sick at home and came back to the hospital.  He was found to be anemic with refused blood transfusions.  GI consulted but there are no plans for endoscopic interventions at this point because he is at high risk for complications related to anesthesia and procedure due to severe anemia, and he is refusing to get blood transfusions. ? ?Subjective / 24h Interval events: ?He remains tremulous this morning.  Answers orientation questions but has to be redirected ? ?Assesement and Plan: ?Principal Problem: ?  GI bleeding ?Active Problems: ?  Acute on chronic anemia ?  Alcohol dependence syndrome (HCC) ?  Colitis, acute ?  Pancreatic insufficiency ?  Tobacco abuse ?  Acute bronchitis ?  Thrombocytopenia (HCC) ?  Leukopenia ? ?Assessment and Plan: ?Principal problem ?GI bleeding-This likely upper GI bleeding given his hematemesis.  GI consulted, no plans for endoscopic intervention due to high risk procedure.  Seems to have stopped bleeding, hemoglobin is stable, continue Protonix, stop continuous infusion and change to Protonix twice daily.  Advance diet as tolerated.  Clinically this is stopped, hemoglobin improving on its own ? ?Active problems ?Acute on chronic blood loss anemia -This is acute blood loss anemia.  Unfortunately he continues to refuse blood transfusions.  Hemoglobin overall appears to be stable but he is at high risk of complications due to being persistently less than 7.  He also refused IV iron.  Hemoglobin slightly better this morning ? ?Thrombocytopenia-due to EtOH use, improving a bit today ? ?Leukopenia-overall stable ? ?Questionable gastroenteritis-no  significant symptoms.  Monitor off antibiotics ? ?Alcohol dependence, alcohol induced hepatic steatosis -continue CIWA, continues to trigger significantly.  Not medically stable for discharge from this standpoint.  Discriminant function less than 32, he has no evidence of ascites on recent imaging and no varices on EGD 6 months ago ? ?Pancreatic insufficiency - minimally elevated lipase. Continue his Creon. ? ?Tobacco abuse -He was counseled for smoking cessation  ? ?Scheduled Meds: ? folic acid  1 mg Oral Daily  ? guaiFENesin  600 mg Oral BID  ? lipase/protease/amylase  36,000 Units Oral TID AC  ? LORazepam  0-4 mg Intravenous Q12H  ? Or  ? LORazepam  0-4 mg Oral Q12H  ? mouth rinse  15 mL Mouth Rinse BID  ? multivitamin with minerals  1 tablet Oral Daily  ? thiamine  100 mg Oral Daily  ? Or  ? thiamine  100 mg Intravenous Daily  ? ?Continuous Infusions: ? cefTRIAXone (ROCEPHIN)  IV 1 g (05/09/21 1547)  ? pantoprazole (PROTONIX) IV 80 mg (05/09/21 2116)  ? ?PRN Meds:.acetaminophen **OR** acetaminophen, ipratropium-albuterol, LORazepam **OR** LORazepam, morphine injection, ondansetron **OR** ondansetron (ZOFRAN) IV, traZODone ? ?Diet Orders (From admission, onward)  ? ?  Start     Ordered  ? 05/08/21 1243  Diet 2 gram sodium Room service appropriate? Yes; Fluid consistency: Thin  Diet effective now       ?Question Answer Comment  ?Room service appropriate? Yes   ?Fluid consistency: Thin   ?  ? 05/08/21 1242  ? ?  ?  ? ?  ? ? ?DVT prophylaxis: SCDs Start: 05/08/21 0318 ? ? ?  Lab Results  ?Component Value Date  ? PLT 52 (L) 05/10/2021  ? ? ?  Code Status: Full Code ? ?Family Communication: no family at bedside  ? ?Status is: Inpatient ? ?Remains inpatient appropriate because: CIWA ? ?Level of care: Progressive ? ?Consultants:  ?GI ? ?Procedures:  ?none ? ?Microbiology  ?none ? ?Antimicrobials: ?none  ? ? ?Objective: ?Vitals:  ? 05/10/21 0306 05/10/21 0400 05/10/21 0426 05/10/21 0800  ?BP: (!) 146/88   (!) 144/79   ?Pulse: 99 (!) 51 99 100  ?Resp: 15   18  ?Temp: 97.9 ?F (36.6 ?C)   98.4 ?F (36.9 ?C)  ?TempSrc: Oral   Oral  ?SpO2: 95%   91%  ? ? ?Intake/Output Summary (Last 24 hours) at 05/10/2021 0933 ?Last data filed at 05/10/2021 0815 ?Gross per 24 hour  ?Intake 940 ml  ?Output 1500 ml  ?Net -560 ml  ? ? ?Wt Readings from Last 3 Encounters:  ?05/05/21 75.3 kg  ?12/12/20 75.3 kg  ?12/04/20 74.8 kg  ? ? ?Examination: ? ?Constitutional: NAD, tremulous ?Eyes: lids and conjunctivae normal, no scleral icterus ?ENMT: mmm ?Neck: normal, supple ?Respiratory: clear to auscultation bilaterally, no wheezing, no crackles. ?Cardiovascular: Regular rate and rhythm, no murmurs / rubs / gallops. No LE edema. ?Abdomen: soft, no distention, no tenderness. Bowel sounds positive.  ?Skin: no rashes ?Neurologic: no focal deficits, equal strength ? ? ?Data Reviewed: I have independently reviewed following labs and imaging studies  ? ?CBC ?Recent Labs  ?Lab 05/05/21 ?2216 05/06/21 ?3875 05/06/21 ?2114 05/07/21 ?0352 05/07/21 ?2057 05/08/21 ?0459 05/08/21 ?6433 05/08/21 ?1344 05/09/21 ?0721 05/10/21 ?0342  ?WBC 3.3* 2.0* 2.7* 2.4* 3.2* 2.6*  --  3.6* 4.0 3.3*  ?HGB 7.3* 7.1* 7.4* 6.7* 6.8* 5.9*  --  6.2* 6.4* 6.7*  ?HCT 24.7* 23.1* 25.0* 21.8* 22.0* 19.4*  --  21.1* 20.4* 21.6*  ?PLT 72* 57* 60* 49* 49* 40* 38* 38* 40* 52*  ?MCV 83.7 82.2 83.3 83.5 84.0 82.9  --  84.1 82.9 82.8  ?MCH 24.7* 25.3* 24.7* 25.7* 26.0 25.2*  --  24.7* 26.0 25.7*  ?MCHC 29.6* 30.7 29.6* 30.7 30.9 30.4  --  29.4* 31.4 31.0  ?RDW 21.4* 21.3* 21.2* 21.5* 21.5* 21.2*  --  21.2* 21.0* 21.6*  ?LYMPHSABS 1.2 1.1 1.1 1.0 1.2  --   --   --   --   --   ?MONOABS 0.3 0.2 0.3 0.2 0.3  --   --   --   --   --   ?EOSABS 0.0 0.0 0.0 0.0 0.0  --   --   --   --   --   ?BASOSABS 0.0 0.0 0.0 0.0 0.0  --   --   --   --   --   ? ? ? ?Recent Labs  ?Lab 05/05/21 ?2216 05/06/21 ?2951 05/06/21 ?2114 05/07/21 ?0352 05/07/21 ?2057 05/08/21 ?0459 05/08/21 ?8841 05/09/21 ?6606 05/10/21 ?0342  ?NA 138 137  138 141 138 140  --  133* 133*  ?K 3.2* 3.3* 3.4* 3.3* 3.4* 3.2*  --  3.6 3.4*  ?CL 99 100 101 105 103 107  --  96* 98  ?CO2 22 22 23 26 26 25   --  25 24  ?GLUCOSE 103* 78 96 86 88 80  --  107* 90  ?BUN 10 8 8 7  5* <5*  --  <5* <5*  ?CREATININE 0.81 0.67 0.77 0.60* 0.72 0.65  --  0.70 0.71  ?CALCIUM 8.8* 8.2* 8.7* 7.9* 8.0*  7.5*  --  8.0* 8.6*  ?AST 148* 124* 108* 92* 128* 126*  --  74*  --   ?ALT 46* 41 41 35 41 39  --  34  --   ?ALKPHOS 88 79 88 71 79 70  --  81  --   ?BILITOT 0.7 0.5 0.6 0.5 0.5 0.5  --  1.6*  --   ?ALBUMIN 3.7 3.3* 3.7 3.2* 3.6 3.1*  --  3.2*  --   ?MG  --  1.5*  --  1.4*  --   --  1.3*  --   --   ?DDIMER  --   --   --   --   --   --  1.11*  --   --   ?INR 1.0 1.0  --  1.0  --  1.0 1.1  --   --   ? ? ? ?------------------------------------------------------------------------------------------------------------------ ?No results for input(s): CHOL, HDL, LDLCALC, TRIG, CHOLHDL, LDLDIRECT in the last 72 hours. ? ?No results found for: HGBA1C ?------------------------------------------------------------------------------------------------------------------ ?No results for input(s): TSH, T4TOTAL, T3FREE, THYROIDAB in the last 72 hours. ? ?Invalid input(s): FREET3 ? ?Cardiac Enzymes ?No results for input(s): CKMB, TROPONINI, MYOGLOBIN in the last 168 hours. ? ?Invalid input(s): CK ?------------------------------------------------------------------------------------------------------------------ ?   ?Component Value Date/Time  ? BNP 286.7 (H) 11/23/2015 0221  ? ? ?CBG: ?No results for input(s): GLUCAP in the last 168 hours. ? ?Recent Results (from the past 240 hour(s))  ?Resp Panel by RT-PCR (Flu A&B, Covid) Nasopharyngeal Swab     Status: None  ? Collection Time: 05/06/21  6:48 AM  ? Specimen: Nasopharyngeal Swab; Nasopharyngeal(NP) swabs in vial transport medium  ?Result Value Ref Range Status  ? SARS Coronavirus 2 by RT PCR NEGATIVE NEGATIVE Final  ?  Comment: (NOTE) ?SARS-CoV-2 target nucleic acids  are NOT DETECTED. ? ?The SARS-CoV-2 RNA is generally detectable in upper respiratory ?specimens during the acute phase of infection. The lowest ?concentration of SARS-CoV-2 viral copies this assay can detect is ?13

## 2021-05-10 NOTE — Evaluation (Signed)
Occupational Therapy Evaluation ?Patient Details ?Name: Samuel Blevins ?MRN: [id number] ?DOB: [birth year] ?Today's Date: 05/10/2021 ? ? ?History of Present Illness 56 y.o. male presents to [facility] hospital on 05/09/2021 with acute onset hematemesis and anemia. PMH includes alcoholism and HTN.  ? ?Clinical Impression ?  ?Patient admitted for the diagnosis above.  PTA he lived in a homeless shelter, but OT unable to gather any other pertinent past history.  Patient with increasing agitation, restlessness, and confusion.  Out of bed deferred due to safety/fall concerns.  The patient had self removed IV and is wanting to leave the hospital.  Currently he is needing Mod A for basic mobility, and up to Max A for lower body ADL at bedlevel.  OT will follow the patient in the acute setting, but SNF has been recommended for post acute rehab.  Unsure what his options will be given he is uninsured.   ?   ? ?Recommendations for follow up therapy are one component of a multi-disciplinary discharge planning process, led by the attending physician.  Recommendations may be updated based on patient status, additional functional criteria and insurance authorization.  ? ?Follow Up Recommendations ? Skilled nursing-short term rehab (<3 hours/day)  ?  ?Assistance Recommended at Discharge Frequent or constant Supervision/Assistance  ?Patient can return home with the following A lot of help with walking and/or transfers;A lot of help with bathing/dressing/bathroom;Direct supervision/assist for medications management;Help with stairs or ramp for entrance;Assist for transportation;Assistance with cooking/housework ? ?  ?Functional Status Assessment ? Patient has had a recent decline in their functional status and demonstrates the ability to make significant improvements in function in a reasonable and predictable amount of time.  ?Equipment Recommendations ? None recommended by OT  ?  ?Recommendations for Other Services   ? ? ?  ?Precautions /  Restrictions Precautions ?Precautions: Fall ?Precaution Comments: ETOH with increasing agitation, confusion and tremors. ?Restrictions ?Weight Bearing Restrictions: No  ? ?  ? ?Mobility Bed Mobility ?  ?Bed Mobility: Supine to Sit, Sit to Supine ?  ?  ?Supine to sit: Mod assist ?Sit to supine: Mod assist ?  ?General bed mobility comments: poor safety and awareness ?  ? ?Transfers ?Overall transfer level: Needs assistance ?  ?  ?  ?  ?  ?  ?  ?  ?General transfer comment: OOB deferred ?  ? ?  ?Balance Overall balance assessment: Needs assistance ?Sitting-balance support: No upper extremity supported, Feet supported ?Sitting balance-Leahy Scale: Fair ?Sitting balance - Comments: patient able to support himself in a semi reclined position with elbows on the bed, and feet off the ground.  Difficulty leaning forward initially. ?Postural control: Left lateral lean ?  ?  ?  ?  ?  ?  ?  ?  ?  ?  ?  ?  ?  ?  ?   ? ?ADL either performed or assessed with clinical judgement  ? ?ADL   ?  ?  ?Grooming: Wash/dry hands;Wash/dry face;Min guard;Bed level ?  ?  ?  ?  ?  ?Upper Body Dressing : Minimal assistance;Bed level ?  ?Lower Body Dressing: Maximal assistance;Bed level ?  ?  ?Toilet Transfer Details (indicate cue type and reason): deferred due to agitation, male purewick in place ?  ?  ?  ?  ?  ?   ? ? ? ?Vision   ?Vision Assessment?: No apparent visual deficits  ?   ?Perception Perception ?Perception: Not tested ?  ?Praxis Praxis ?Praxis: Not tested ?  ? ?  Pertinent Vitals/Pain Pain Assessment ?Pain Assessment: No/denies pain ?Pain Intervention(s): Monitored during session  ? ? ? ?Hand Dominance Right ?  ?Extremity/Trunk Assessment Upper Extremity Assessment ?Upper Extremity Assessment: Overall WFL for tasks assessed ?  ?Lower Extremity Assessment ?Lower Extremity Assessment: Defer to PT evaluation ?  ?Cervical / Trunk Assessment ?Cervical / Trunk Assessment: Normal ?  ?Communication Communication ?Communication: Expressive  difficulties ?  ?Cognition Arousal/Alertness: Awake/alert ?Behavior During Therapy: Restless, Agitated, Impulsive ?Overall Cognitive Status: Impaired/Different from baseline ?Area of Impairment: Attention, Following commands, Safety/judgement, Awareness, Problem solving, Orientation ?  ?  ?  ?  ?  ?  ?  ?  ?Orientation Level: Person ?Current Attention Level: Focused ?  ?Following Commands: Follows multi-step commands inconsistently, Follows one step commands inconsistently ?Safety/Judgement: Decreased awareness of safety, Decreased awareness of deficits ?Awareness: Intellectual ?Problem Solving: Slow processing, Requires verbal cues, Requires tactile cues ?  ?  ?  ?General Comments   VSS on RA ? ?  ?Exercises   ?  ?Shoulder Instructions    ? ? 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  ?Type of Home: Other(Comment) (unknown shelter) ?  ?  ?  ?Home Layout: Other (Comment) (Patient not able to articulate) ?  ?  ?  ?  ?  ?  ?  ?  ?  ?  ?  ? ?  ?Prior Functioning/Environment Prior Level of Function : Patient poor historian/Family not available ?  ?  ?  ?  ?  ?  ?  ?  ?  ? ?  ?  ?OT Problem List: Decreased activity tolerance;Impaired balance (sitting and/or standing);Decreased cognition;Decreased safety awareness ?  ?   ?OT Treatment/Interventions: Self-care/ADL training;Balance training;Patient/family education;Therapeutic activities;Cognitive remediation/compensation  ?  ?OT Goals(Current goals can be found in the care plan section) Acute Rehab OT Goals ?Patient Stated Goal: Patient wanting to leave ?OT Goal Formulation: Patient unable to participate in goal setting ?Time For Goal Achievement: 05/24/21 ?ADL Goals ?Pt Will Perform Grooming: standing;with supervision ?Pt Will Perform Upper Body Dressing: with supervision;sitting ?Pt Will Perform Lower Body Dressing: with min guard assist;sit to/from stand ?Pt Will  Transfer to Toilet: with min assist;ambulating;regular height toilet ?Pt Will Perform Toileting - Clothing Manipulation and hygiene: with min guard assist;sit to/from stand  ?OT Frequency: Min 2X/week ?  ? ?Co-evaluation   ?  ?  ?  ?  ? ?  ?AM-PAC OT "6 Clicks" Daily Activity     ?Outcome Measure Help from another person eating meals?: A Little ?Help from another person taking care of personal grooming?: A Little ?Help from another person toileting, which includes using toliet, bedpan, or urinal?: A Lot ?Help from another person bathing (including washing, rinsing, drying)?: A Lot ?Help from another person to put on and taking off regular upper body clothing?: A Lot ?Help from another person to put on and taking off regular lower body clothing?: A Lot ?6 Click Score: 14 ?  ?End of Session Nurse Communication: Mobility status ? ?Activity Tolerance: Treatment limited secondary to agitation ?Patient left: in bed;with call bell/phone within reach;with bed alarm set ? ?OT Visit Diagnosis: Unsteadiness on feet (R26.81);Muscle weakness (generalized) (M62.81);Other symptoms and signs involving cognitive function  ?              ?Time: 2423-5361 ?OT Time Calculation (min): 15 min ?Charges:  OT General Charges ?$OT Visit: 1 Visit ?OT Evaluation ?$OT Eval Moderate Complexity: 1 Mod ? ?05/10/2021 ? ?  RP, OTR/L ? ?Acute Rehabilitation Services ? ?Office:  [phone] ? ? ?Jaelen Gellerman D Letasha Kershaw ?05/10/2021, 11:48 AM ?

## 2021-05-11 DIAGNOSIS — K2920 Alcoholic gastritis without bleeding: Secondary | ICD-10-CM

## 2021-05-11 LAB — CBC
HCT: 23 % — ABNORMAL LOW (ref 39.0–52.0)
Hemoglobin: 7.1 g/dL — ABNORMAL LOW (ref 13.0–17.0)
MCH: 26 pg (ref 26.0–34.0)
MCHC: 30.9 g/dL (ref 30.0–36.0)
MCV: 84.2 fL (ref 80.0–100.0)
Platelets: 78 10*3/uL — ABNORMAL LOW (ref 150–400)
RBC: 2.73 MIL/uL — ABNORMAL LOW (ref 4.22–5.81)
RDW: 22.1 % — ABNORMAL HIGH (ref 11.5–15.5)
WBC: 4.1 10*3/uL (ref 4.0–10.5)
nRBC: 0 % (ref 0.0–0.2)

## 2021-05-11 LAB — BASIC METABOLIC PANEL
Anion gap: 12 (ref 5–15)
BUN: 5 mg/dL — ABNORMAL LOW (ref 6–20)
CO2: 22 mmol/L (ref 22–32)
Calcium: 8.9 mg/dL (ref 8.9–10.3)
Chloride: 98 mmol/L (ref 98–111)
Creatinine, Ser: 0.73 mg/dL (ref 0.61–1.24)
GFR, Estimated: >60 mL/min (ref >60)
Glucose, Bld: 97 mg/dL (ref 70–99)
Potassium: 3.5 mmol/L (ref 3.5–5.1)
Sodium: 132 mmol/L — ABNORMAL LOW (ref 135–145)

## 2021-05-11 MED ORDER — PANTOPRAZOLE SODIUM 40 MG PO TBEC
40.0000 mg | DELAYED_RELEASE_TABLET | Freq: Two times a day (BID) | ORAL | Status: DC
  Administered 2021-05-12 – 2021-05-13 (×2): 40 mg via ORAL
  Filled 2021-05-11 (×2): qty 1

## 2021-05-11 NOTE — Progress Notes (Signed)
?PROGRESS NOTE ? ?Samuel Blevins QRF:[id number] DOB: 1965/11/30 DOA: 05/07/2021 ?PCP: Pcp, No ? ? LOS: 3 days  ? ?Brief Narrative / Interim history: ?56 year old male with alcoholism, HTN, who comes to the hospital with hematemesis, abdominal pain.  He was several times in the ED in the last few days but always left AMA.  He continued to feel sick at home and came back to the hospital.  He was found to be anemic with refused blood transfusions.  GI consulted but there are no plans for endoscopic interventions at this point because he is at high risk for complications related to anesthesia and procedure due to severe anemia, and he is refusing to get blood transfusions. ? ?Subjective / 24h Interval events: ?Tremulous, confused ? ?Assesement and Plan: ?Principal Problem: ?  GI bleeding ?Active Problems: ?  Acute on chronic anemia ?  Alcohol dependence syndrome (HCC) ?  Colitis, acute ?  Pancreatic insufficiency ?  Tobacco abuse ?  Acute bronchitis ?  Thrombocytopenia (HCC) ?  Leukopenia ? ?Assessment and Plan: ?Principal problem ?GI bleeding-This likely upper GI bleeding given his hematemesis.  GI consulted, no plans for endoscopic intervention due to high risk procedure.  Seems to have stopped bleeding, hemoglobin is stable, continue Protonix, stop continuous infusion and change Protonix to twice daily.  Advance diet as tolerated.  Continue to monitor hemoglobin ? ?Active problems ?Acute on chronic blood loss anemia -This is acute blood loss anemia.  Unfortunately he continues to refuse blood transfusions.  Hemoglobin appears to be stable, improving on its own, about 7 this morning. ? ?Thrombocytopenia-due to EtOH use, improving ? ?Leukopenia-overall stable ? ?Questionable gastroenteritis-no significant symptoms, possibly gut edema.  Monitor off antibiotics ? ?Alcohol dependence, alcohol induced hepatic steatosis -continue CIWA, continues to trigger significantly and he is at high risk for DTs.  Intermittent  hallucinations per bedside RN.  Not medically stable for discharge from this standpoint.  Discriminant function less than 32, he has no evidence of ascites on recent imaging and no varices on EGD 6 months ago ? ?Pancreatic insufficiency - minimally elevated lipase. Continue his Creon. ? ?Tobacco abuse -He was counseled for smoking cessation  ? ?Scheduled Meds: ? folic acid  1 mg Oral Daily  ? guaiFENesin  600 mg Oral BID  ? lipase/protease/amylase  36,000 Units Oral TID AC  ? LORazepam  0-4 mg Intravenous Q12H  ? Or  ? LORazepam  0-4 mg Oral Q12H  ? mouth rinse  15 mL Mouth Rinse BID  ? multivitamin with minerals  1 tablet Oral Daily  ? pantoprazole  40 mg Oral BID AC  ? thiamine  100 mg Oral Daily  ? Or  ? thiamine  100 mg Intravenous Daily  ? ?Continuous Infusions: ? ? ?PRN Meds:.acetaminophen **OR** acetaminophen, ipratropium-albuterol, LORazepam **OR** LORazepam, morphine injection, ondansetron **OR** ondansetron (ZOFRAN) IV, traZODone ? ?Diet Orders (From admission, onward)  ? ?  Start     Ordered  ? 05/10/21 0944  Diet 2 gram sodium Room service appropriate? Yes with Assist; Fluid consistency: Thin  Diet effective now       ?Question Answer Comment  ?Room service appropriate? Yes with Assist   ?Fluid consistency: Thin   ?  ? 05/10/21 0943  ? ?  ?  ? ?  ? ? ?DVT prophylaxis: SCDs Start: 05/08/21 0318 ? ? ?Lab Results  ?Component Value Date  ? PLT 78 (L) 05/11/2021  ? ? ?  Code Status: Full Code ? ?Family Communication: no family at  bedside  ? ?Status is: Inpatient ? ?Remains inpatient appropriate because: CIWA ? ?Level of care: Progressive ? ?Consultants:  ?GI ? ?Procedures:  ?none ? ?Microbiology  ?none ? ?Antimicrobials: ?none  ? ? ?Objective: ?Vitals:  ? 05/11/21 0258 05/11/21 0300 05/11/21 0752 05/11/21 0930  ?BP: 119/78 (!) 158/135 112/79   ?Pulse: (!) 107 (!) 102 (!) 103 (!) 120  ?Resp: 14  16   ?Temp:   98 ?F (36.7 ?C)   ?TempSrc:   Oral   ?SpO2: 92%  96%   ? ? ?Intake/Output Summary (Last 24 hours) at  05/11/2021 1001 ?Last data filed at 05/11/2021 0845 ?Gross per 24 hour  ?Intake 720 ml  ?Output 600 ml  ?Net 120 ml  ? ? ?Wt Readings from Last 3 Encounters:  ?05/05/21 75.3 kg  ?12/12/20 75.3 kg  ?12/04/20 74.8 kg  ? ? ?Examination: ?Constitutional: Tremulous ?Eyes: lids and conjunctivae normal, no scleral icterus ?ENMT: mmm ?Neck: normal, supple ?Respiratory: clear to auscultation bilaterally, no wheezing, no crackles.  ?Cardiovascular: Regular rate and rhythm, no murmurs / rubs / gallops. No LE edema. ?Abdomen: soft, no distention, no tenderness. Bowel sounds positive.  ?Skin: no rashes ?Neurologic: no focal deficits, equal strength ? ?Data Reviewed: I have independently reviewed following labs and imaging studies  ? ?CBC ?Recent Labs  ?Lab 05/05/21 ?2216 05/06/21 ?[id number] 05/06/21 ?2114 05/07/21 ?0352 05/07/21 ?2057 05/08/21 ?0459 05/08/21 ?[id number] 05/08/21 ?1344 05/09/21 ?0721 05/10/21 ?0342 05/11/21 ?0045  ?WBC 3.3* 2.0* 2.7* 2.4* 3.2* 2.6*  --  3.6* 4.0 3.3* 4.1  ?HGB 7.3* 7.1* 7.4* 6.7* 6.8* 5.9*  --  6.2* 6.4* 6.7* 7.1*  ?HCT 24.7* 23.1* 25.0* 21.8* 22.0* 19.4*  --  21.1* 20.4* 21.6* 23.0*  ?PLT 72* 57* 60* 49* 49* 40* 38* 38* 40* 52* 78*  ?MCV 83.7 82.2 83.3 83.5 84.0 82.9  --  84.1 82.9 82.8 84.2  ?MCH 24.7* 25.3* 24.7* 25.7* 26.0 25.2*  --  24.7* 26.0 25.7* 26.0  ?MCHC 29.6* 30.7 29.6* 30.7 30.9 30.4  --  29.4* 31.4 31.0 30.9  ?RDW 21.4* 21.3* 21.2* 21.5* 21.5* 21.2*  --  21.2* 21.0* 21.6* 22.1*  ?LYMPHSABS 1.2 1.1 1.1 1.0 1.2  --   --   --   --   --   --   ?MONOABS 0.3 0.2 0.3 0.2 0.3  --   --   --   --   --   --   ?EOSABS 0.0 0.0 0.0 0.0 0.0  --   --   --   --   --   --   ?BASOSABS 0.0 0.0 0.0 0.0 0.0  --   --   --   --   --   --   ? ? ? ?Recent Labs  ?Lab 05/05/21 ?2216 05/06/21 ?[id number] 05/06/21 ?2114 05/07/21 ?0352 05/07/21 ?2057 05/08/21 ?0459 05/08/21 ?[id number] 05/09/21 ?[id number] 05/10/21 ?0342 05/11/21 ?0045  ?NA 138 137 138 141 138 140  --  133* 133* 132*  ?K 3.2* 3.3* 3.4* 3.3* 3.4* 3.2*  --  3.6 3.4* 3.5  ?CL 99  100 101 105 103 107  --  96* 98 98  ?CO2 22 22 23 26 26 25   --  25 24 22   ?GLUCOSE 103* 78 96 86 88 80  --  107* 90 97  ?BUN 10 8 8 7  5* <5*  --  <5* <5* 5*  ?CREATININE 0.81 0.67 0.77 0.60* 0.72 0.65  --  0.70 0.71 0.73  ?CALCIUM 8.8* 8.2* 8.7* 7.9*  8.0* 7.5*  --  8.0* 8.6* 8.9  ?AST 148* 124* 108* 92* 128* 126*  --  74*  --   --   ?ALT 46* 41 41 35 41 39  --  34  --   --   ?ALKPHOS 88 79 88 71 79 70  --  81  --   --   ?BILITOT 0.7 0.5 0.6 0.5 0.5 0.5  --  1.6*  --   --   ?ALBUMIN 3.7 3.3* 3.7 3.2* 3.6 3.1*  --  3.2*  --   --   ?MG  --  1.5*  --  1.4*  --   --  1.3*  --   --   --   ?DDIMER  --   --   --   --   --   --  1.11*  --   --   --   ?INR 1.0 1.0  --  1.0  --  1.0 1.1  --   --   --   ? ? ? ?------------------------------------------------------------------------------------------------------------------ ?No results for input(s): CHOL, HDL, LDLCALC, TRIG, CHOLHDL, LDLDIRECT in the last 72 hours. ? ?No results found for: HGBA1C ?------------------------------------------------------------------------------------------------------------------ ?No results for input(s): TSH, T4TOTAL, T3FREE, THYROIDAB in the last 72 hours. ? ?Invalid input(s): FREET3 ? ?Cardiac Enzymes ?No results for input(s): CKMB, TROPONINI, MYOGLOBIN in the last 168 hours. ? ?Invalid input(s): CK ?------------------------------------------------------------------------------------------------------------------ ?   ?Component Value Date/Time  ? BNP 286.7 (H) 11/23/2015 0221  ? ? ?CBG: ?No results for input(s): GLUCAP in the last 168 hours. ? ?Recent Results (from the past 240 hour(s))  ?Resp Panel by RT-PCR (Flu A&B, Covid) Nasopharyngeal Swab     Status: None  ? Collection Time: 05/06/21  6:48 AM  ? Specimen: Nasopharyngeal Swab; Nasopharyngeal(NP) swabs in vial transport medium  ?Result Value Ref Range Status  ? SARS Coronavirus 2 by RT PCR NEGATIVE NEGATIVE Final  ?  Comment: (NOTE) ?SARS-CoV-2 target nucleic acids are NOT DETECTED. ? ?The  SARS-CoV-2 RNA is generally detectable in upper respiratory ?specimens during the acute phase of infection. The lowest ?concentration of SARS-CoV-2 viral copies this assay can detect is ?138 copies/mL. A negati

## 2021-05-11 NOTE — Evaluation (Signed)
Clinical/Bedside Swallow Evaluation ?Patient Details  ?Name: Samuel Blevins ?MRN: [id number] ?Date of Birth: 09/24/1965 ? ?Today's Date: 05/11/2021 ?Time: SLP Start Time (ACUTE ONLY): [id number] SLP Stop Time (ACUTE ONLY): 1002 ?SLP Time Calculation (min) (ACUTE ONLY): 10 min ? ?Past Medical History:  ?Past Medical History:  ?Diagnosis Date  ? Alcohol withdrawal syndrome with complication (HCC)   ? Alcoholism (HCC)   ? Elevated AST (SGOT)   ? Homeless   ? Hypertension   ? Symptomatic anemia 11/23/2015  ? ?Past Surgical History:  ?Past Surgical History:  ?Procedure Laterality Date  ? BIOPSY  06/23/2019  ? Procedure: BIOPSY;  Surgeon: Shellia Cleverly, DO;  Location: [facility] ENDOSCOPY;  Service: Gastroenterology;;  ? BIOPSY  12/12/2020  ? Procedure: BIOPSY;  Surgeon: Lynann Bologna, MD;  Location: [facility] ENDOSCOPY;  Service: Endoscopy;;  ? ESOPHAGOGASTRODUODENOSCOPY (EGD) WITH PROPOFOL N/A 06/23/2019  ? Procedure: ESOPHAGOGASTRODUODENOSCOPY (EGD) WITH PROPOFOL;  Surgeon: Shellia Cleverly, DO;  Location: [facility] ENDOSCOPY;  Service: Gastroenterology;  Laterality: N/A;  ? ESOPHAGOGASTRODUODENOSCOPY (EGD) WITH PROPOFOL N/A 12/12/2020  ? Procedure: ESOPHAGOGASTRODUODENOSCOPY (EGD) WITH PROPOFOL;  Surgeon: Lynann Bologna, MD;  Location: [facility] ENDOSCOPY;  Service: Endoscopy;  Laterality: N/A;  ? HOT HEMOSTASIS N/A 06/23/2019  ? Procedure: HOT HEMOSTASIS (ARGON PLASMA COAGULATION/BICAP);  Surgeon: Shellia Cleverly, DO;  Location: [facility] ENDOSCOPY;  Service: Gastroenterology;  Laterality: N/A;  ? ?HPI:  ?56 y.o. male presents to [facility] hospital on 05/09/2021 with acute onset hematemesis and anemia. PMH includes alcoholism and HTN.  ?  ?Assessment / Plan / Recommendation  ?Clinical Impression ? Pt presents with a cognitive-based dysphagia related to current mental status.  He presented with soft mitt restraints, speech is incoherent; he is generally restless, difficulty following commands.  Oral exam without focal deficits.  He accepted sips of water, initially  with explosive cough response, but no further coughing was noted when drinking water from a straw.  He ate several bites of a cracker with adequate attention, thorough mastication, palpable swallow, and no concern for airway intrusion.  No biomechanical dysphagia noted. Recommend continuing a regular diet/thin liquids and assisting with meals as needed. Samuel Blevins may cough after first sip of liquid - provide him further opportunities; if he continues coughing, hold liquids  Allow PO intake when he is alert and attentive to food.  SLP will follow briefly. D/W RN. ?SLP Visit Diagnosis: Dysphagia, unspecified (R13.10) ?   ?Aspiration Risk ? Mild aspiration risk  ?  ?Diet Recommendation   Regular solids, thin liquids ? ?Medication Administration: Whole meds with puree  ?  ?Other  Recommendations Oral Care Recommendations: Oral care BID   ? ?Recommendations for follow up therapy are one component of a multi-disciplinary discharge planning process, led by the attending physician.  Recommendations may be updated based on patient status, additional functional criteria and insurance authorization. ? ?Follow up Recommendations No SLP follow up  ? ? ?  ?Assistance Recommended at Discharge    ?Functional Status Assessment    ?Frequency and Duration min 2x/week  ?1 week ?  ?   ? ?Prognosis    ? ?  ? ?Swallow Study   ?General Date of Onset: 05/07/21 ?HPI: 56 y.o. male presents to [facility] hospital on 05/09/2021 with acute onset hematemesis and anemia. PMH includes alcoholism and HTN. ?Type of Study: Bedside Swallow Evaluation ?Previous Swallow Assessment: no ?Diet Prior to this Study: Regular;Thin liquids ?Temperature Spikes Noted: No ?Respiratory Status: Room air ?History of Recent Intubation: No ?Behavior/Cognition: Lethargic/Drowsy;Distractible;Impulsive ?Oral Cavity Assessment: Within Functional Limits ?  Oral Care Completed by SLP: Recent completion by staff ?Oral Cavity - Dentition: Missing dentition ?Self-Feeding Abilities: Needs  assist ?Patient Positioning: Upright in bed ?Baseline Vocal Quality: Normal ?Volitional Cough: Cognitively unable to elicit ?Volitional Swallow: Unable to elicit  ?  ?Oral/Motor/Sensory Function Overall Oral Motor/Sensory Function: Other (comment) (no focal deficits)   ?Ice Chips Ice chips: Not tested   ?Thin Liquid Thin Liquid: Within functional limits  ?  ?Nectar Thick Nectar Thick Liquid: Not tested   ?Honey Thick Honey Thick Liquid: Not tested   ?Puree Puree: Not tested   ?Solid ? ? ?  Solid: Within functional limits  ? ?  ? ?Blenda Mounts Laurice ?05/11/2021,10:13 AM ? ?Tiffiny Worthy L. Easter Schinke, MA CCC/SLP ?Acute Rehabilitation Services ?Office number [phone] ?Pager [phone] ? ? ?

## 2021-05-11 NOTE — Progress Notes (Signed)
Physical Therapy Treatment ?Patient Details ?Name: Samuel Blevins ?MRN: [id number] ?DOB: 03-Oct-1965 ?Today's Date: 05/11/2021 ? ? ?History of Present Illness 56 y.o. male presents to [facility] hospital on 05/09/2021 with acute onset hematemesis and anemia. PMH includes alcoholism and HTN. ? ?  ?PT Comments  ? ? Focus of session today was functional bed mobility, sitting balance, as well as adl assistance. The patient presented as significantly lethargic and confused throughout session, SPT feels from intense withdraw symptoms. He had significant tremors and limited intention movement during bed mobility or supine<>sit transfer. He also shows constant tremors and limited orientation to his environment. RN was notified and contacting MD about pt presentation.Pt. Would benefit from skilled PT to continue to address functional mobility, balance, gait, and ADL training. Plan and discharge setting remains unchanged. Pt to follow acutely as appropriate.  ?   ?Recommendations for follow up therapy are one component of a multi-disciplinary discharge planning process, led by the attending physician.  Recommendations may be updated based on patient status, additional functional criteria and insurance authorization. ? ?Follow Up Recommendations ? Skilled nursing-short term rehab (<3 hours/day) ?  ?  ?Assistance Recommended at Discharge Frequent or constant Supervision/Assistance  ?Patient can return home with the following A lot of help with walking and/or transfers;A lot of help with bathing/dressing/bathroom;Assistance with cooking/housework;Assistance with feeding;Direct supervision/assist for medications management;Assist for transportation;Direct supervision/assist for financial management;Help with stairs or ramp for entrance ?  ?Equipment Recommendations ? Other (comment) (unable to determine at this time, limited by withdraw symptoms and orientation)  ?  ?Recommendations for Other Services   ? ? ?  ?Precautions / Restrictions  Precautions ?Precautions: Fall ?Precaution Comments: ETOH with increasing agitation, confusion and tremors. ?Restrictions ?Weight Bearing Restrictions: No  ?  ? ?Mobility ? Bed Mobility ?Overal bed mobility: Needs Assistance ?Bed Mobility: Supine to Sit, Sit to Supine ?  ?  ?Supine to sit: Max assist, +2 for physical assistance, +2 for safety/equipment ?Sit to supine: Max assist, +2 for physical assistance, +2 for safety/equipment ?  ?General bed mobility comments: Physcial assitance, verbal, and tacticle cues needed for all movement. Pt. was only able to move LE when returning to supine. Total A for scoot to [place]. Assited ADL for oral care and clean up, pt with limited response and command following. ?  ? ?Transfers ?  ?  ?  ?  ?  ?  ?  ?  ?  ?  ?  ? ?Ambulation/Gait ?  ?  ?  ?  ?  ?  ?  ?  ? ? ?Stairs ?  ?  ?  ?  ?  ? ? ?Wheelchair Mobility ?  ? ?Modified Rankin (Stroke Patients Only) ?  ? ? ?  ?Balance Overall balance assessment: Needs assistance ?Sitting-balance support: Bilateral upper extremity supported, Feet supported ?Sitting balance-Leahy Scale: Zero ?Sitting balance - Comments: Pt. requires max A for sitting balance and quickly pushes to lay back down. Significant posterior lean and tremors throughout supine <> sit transfer. ?Postural control: Posterior lean ?  ?  ?  ?  ?  ?  ?  ?  ?  ?  ?  ?  ?  ?  ?  ? ?  ?Cognition Arousal/Alertness: Lethargic ?Behavior During Therapy: Restless, Agitated, Flat affect, Impulsive ?Overall Cognitive Status: Impaired/Different from baseline ?Area of Impairment: Attention, Following commands, Safety/judgement, Awareness, Problem solving, Orientation ?  ?  ?  ?  ?  ?  ?  ?  ?Orientation Level: Person ?Current  Attention Level: Focused ?  ?Following Commands: Follows one step commands inconsistently, Follows multi-step commands inconsistently ?Safety/Judgement: Decreased awareness of safety, Decreased awareness of deficits ?Awareness: Intellectual ?Problem Solving: Slow  processing, Requires verbal cues, Requires tactile cues, Difficulty sequencing ?General Comments: Pt. presents with increased confusion and tremors. Requires max-tot cues for all mobility. ?  ?  ? ?  ?Exercises   ? ?  ?General Comments General comments (skin integrity, edema, etc.): Pt. presents with significant confusion, mild aggitation, tremors, and limited orientation to environment. Decreased level of arousal limited session. ?  ?  ? ?Pertinent Vitals/Pain Pain Assessment ?Pain Assessment: No/denies pain  ? ? ?Home Living   ?  ?  ?  ?  ?  ?  ?  ?  ?  ?   ?  ?Prior Function    ?  ?  ?   ? ?PT Goals (current goals can now be found in the care plan section) Acute Rehab PT Goals ?Patient Stated Goal: PT goal to return to independent mobility ?PT Goal Formulation: Patient unable to participate in goal setting ?Time For Goal Achievement: 05/23/21 ?Potential to Achieve Goals: Fair ?Progress towards PT goals: Not progressing toward goals - comment (Increase in withdraw symptoms) ? ?  ?Frequency ? ? ? Min 2X/week ? ? ? ?  ?PT Plan Current plan remains appropriate  ? ? ?Co-evaluation   ?  ?  ?  ?  ? ?  ?AM-PAC PT "6 Clicks" Mobility   ?Outcome Measure ? Help needed turning from your back to your side while in a flat bed without using bedrails?: A Lot ?Help needed moving from lying on your back to sitting on the side of a flat bed without using bedrails?: A Lot ?Help needed moving to and from a bed to a chair (including a wheelchair)?: Total ?Help needed standing up from a chair using your arms (e.g., wheelchair or bedside chair)?: Total ?Help needed to walk in hospital room?: Total ?Help needed climbing 3-5 steps with a railing? : Total ?6 Click Score: 8 ? ?  ?End of Session   ?Activity Tolerance: Patient limited by lethargy;Treatment limited secondary to agitation ?Patient left: in bed;with call bell/phone within reach;with bed alarm set ?Nurse Communication: Mobility status;Other (comment) (significant increase in  withdraw symptoms) ?PT Visit Diagnosis: Other abnormalities of gait and mobility (R26.89);Muscle weakness (generalized) (M62.81);Other symptoms and signs involving the nervous system (R29.898) ?  ? ? ?Time: [id number] ?PT Time Calculation (min) (ACUTE ONLY): 15 min ? ?Charges:  $Therapeutic Activity: 8-22 mins          ?          ? ?Thermon Leyland, SPT ?Acute Rehab Services ? ? ? ?Thermon Leyland ?05/11/2021, 2:06 PM ? ?

## 2021-05-12 DIAGNOSIS — K2920 Alcoholic gastritis without bleeding: Secondary | ICD-10-CM | POA: Diagnosis not present

## 2021-05-12 LAB — COMPREHENSIVE METABOLIC PANEL
ALT: 35 U/L (ref 0–44)
AST: 81 U/L — ABNORMAL HIGH (ref 15–41)
Albumin: 3.3 g/dL — ABNORMAL LOW (ref 3.5–5.0)
Alkaline Phosphatase: 91 U/L (ref 38–126)
Anion gap: 12 (ref 5–15)
BUN: 6 mg/dL (ref 6–20)
CO2: 22 mmol/L (ref 22–32)
Calcium: 9.4 mg/dL (ref 8.9–10.3)
Chloride: 100 mmol/L (ref 98–111)
Creatinine, Ser: 0.64 mg/dL (ref 0.61–1.24)
GFR, Estimated: >60 mL/min (ref >60)
Glucose, Bld: 84 mg/dL (ref 70–99)
Potassium: 3 mmol/L — ABNORMAL LOW (ref 3.5–5.1)
Sodium: 134 mmol/L — ABNORMAL LOW (ref 135–145)
Total Bilirubin: 1.1 mg/dL (ref 0.3–1.2)
Total Protein: 7.9 g/dL (ref 6.5–8.1)

## 2021-05-12 LAB — MAGNESIUM: Magnesium: 1.7 mg/dL (ref 1.7–2.4)

## 2021-05-12 LAB — CBC
HCT: 25.7 % — ABNORMAL LOW (ref 39.0–52.0)
Hemoglobin: 7.9 g/dL — ABNORMAL LOW (ref 13.0–17.0)
MCH: 26.1 pg (ref 26.0–34.0)
MCHC: 30.7 g/dL (ref 30.0–36.0)
MCV: 84.8 fL (ref 80.0–100.0)
Platelets: 150 10*3/uL (ref 150–400)
RBC: 3.03 MIL/uL — ABNORMAL LOW (ref 4.22–5.81)
RDW: 23.5 % — ABNORMAL HIGH (ref 11.5–15.5)
WBC: 4 10*3/uL (ref 4.0–10.5)
nRBC: 0.5 % — ABNORMAL HIGH (ref 0.0–0.2)

## 2021-05-12 LAB — PHOSPHORUS: Phosphorus: 4.4 mg/dL (ref 2.5–4.6)

## 2021-05-12 MED ORDER — MAGNESIUM SULFATE 2 GM/50ML IV SOLN
2.0000 g | Freq: Once | INTRAVENOUS | Status: AC
  Administered 2021-05-12: 2 g via INTRAVENOUS
  Filled 2021-05-12: qty 50

## 2021-05-12 MED ORDER — POTASSIUM CHLORIDE CRYS ER 20 MEQ PO TBCR
40.0000 meq | EXTENDED_RELEASE_TABLET | Freq: Once | ORAL | Status: AC
  Administered 2021-05-12: 40 meq via ORAL
  Filled 2021-05-12: qty 2

## 2021-05-12 NOTE — Progress Notes (Signed)
?PROGRESS NOTE ? ?Samuel HockeyRobert Blevins [id number] DOB: May 28, 1965 DOA: 05/07/2021 ?PCP: Pcp, No ? ? LOS: 4 days  ? ?Brief Narrative / Interim history: ?56 year old male with alcoholism, HTN, who comes to the hospital with hematemesis, abdominal pain.  He was several times in the ED in the last few days but always left AMA.  He continued to feel sick at home and came back to the hospital.  He was found to be anemic with refused blood transfusions.  GI consulted but there are no plans for endoscopic interventions at this point because he is at high risk for complications related to anesthesia and procedure due to severe anemia, and he is refusing to get blood transfusions. ? ?Subjective / 24h Interval events: ?Remains confused but appears somewhat Calmer this morning ? ?Assesement and Plan: ?Principal Problem: ?  GI bleeding ?Active Problems: ?  Acute on chronic anemia ?  Alcohol dependence syndrome (HCC) ?  Colitis, acute ?  Pancreatic insufficiency ?  Tobacco abuse ?  Acute bronchitis ?  Thrombocytopenia (HCC) ?  Leukopenia ? ?Assessment and Plan: ?Principal problem ?GI bleeding-This likely upper GI bleeding given his hematemesis.  GI consulted, no plans for endoscopic intervention due to high risk procedure.  Seems to have stopped bleeding, hemoglobin is stable, continue Protonix.  Advance diet as tolerated.  Continue to monitor hemoglobin ? ?Active problems ?Acute on chronic blood loss anemia -This is acute blood loss anemia.  Hemoglobin was as low as 5.9, he refused blood transfusions.  Fortunately, with resolution of his bleeding hemoglobin now improving on its own ? ?Thrombocytopenia-due to EtOH use, platelets normalized this morning ? ?Leukopenia-overall stable ? ?Questionable gastroenteritis-no significant symptoms, possibly gut edema.  Monitor off antibiotics ? ?Alcohol dependence, alcohol induced hepatic steatosis -continues to withdrawal, continue CIWA.  Continues to require inpatient care.  Discriminant  function less than 32, he has no evidence of ascites on recent imaging and no varices on EGD 6 months ago ? ?Pancreatic insufficiency - minimally elevated lipase. Continue his Creon. ? ?Tobacco abuse -He was counseled for smoking cessation  ? ?Scheduled Meds: ? folic acid  1 mg Oral Daily  ? guaiFENesin  600 mg Oral BID  ? lipase/protease/amylase  36,000 Units Oral TID AC  ? mouth rinse  15 mL Mouth Rinse BID  ? multivitamin with minerals  1 tablet Oral Daily  ? pantoprazole  40 mg Oral BID AC  ? potassium chloride  40 mEq Oral Once  ? thiamine  100 mg Oral Daily  ? Or  ? thiamine  100 mg Intravenous Daily  ? ?Continuous Infusions: ? magnesium sulfate bolus IVPB    ? ? ?PRN Meds:.acetaminophen **OR** acetaminophen, ipratropium-albuterol, LORazepam **OR** LORazepam, morphine injection, ondansetron **OR** ondansetron (ZOFRAN) IV, traZODone ? ?Diet Orders (From admission, onward)  ? ?  Start     Ordered  ? 05/10/21 0944  Diet 2 gram sodium Room service appropriate? Yes with Assist; Fluid consistency: Thin  Diet effective now       ?Question Answer Comment  ?Room service appropriate? Yes with Assist   ?Fluid consistency: Thin   ?  ? 05/10/21 0943  ? ?  ?  ? ?  ? ? ?DVT prophylaxis: SCDs Start: 05/08/21 0318 ? ? ?Lab Results  ?Component Value Date  ? PLT 150 05/12/2021  ? ? ?  Code Status: Full Code ? ?Family Communication: no family at bedside  ? ?Status is: Inpatient ? ?Remains inpatient appropriate because: CIWA ? ?Level of care: Progressive ? ?Consultants:  ?  GI ? ?Procedures:  ?none ? ?Microbiology  ?none ? ?Antimicrobials: ?none  ? ? ?Objective: ?Vitals:  ? 05/11/21 1915 05/11/21 2306 05/12/21 0316 05/12/21 0754  ?BP: 139/90 (!) 167/91 125/79 140/76  ?Pulse: 98 95 91 93  ?Resp: 12 17 15 13   ?Temp: 98.1 ?F (36.7 ?C) 97.9 ?F (36.6 ?C) 97.6 ?F (36.4 ?C) 98.5 ?F (36.9 ?C)  ?TempSrc: Axillary Oral Oral Axillary  ?SpO2: 100% 100% 100% 100%  ? ? ?Intake/Output Summary (Last 24 hours) at 05/12/2021 1026 ?Last data filed at  05/12/2021 05/14/2021 ?Gross per 24 hour  ?Intake 0 ml  ?Output 600 ml  ?Net -600 ml  ? ? ?Wt Readings from Last 3 Encounters:  ?05/05/21 75.3 kg  ?12/12/20 75.3 kg  ?12/04/20 74.8 kg  ? ? ?Examination: ?Constitutional: NAD ?ENMT: mmm ?Neck: normal, supple ?Respiratory: clear to auscultation bilaterally, no wheezing, no crackles. Normal respiratory effort.  ?Cardiovascular: Regular rate and rhythm, no murmurs / rubs / gallops. No LE edema. ?Abdomen: soft, no distention, no tenderness. Bowel sounds positive.  ?Skin: no rashes ?Neurologic: Confused, does not follow commands consistently but appears equal without focal deficits ? ?Data Reviewed: I have independently reviewed following labs and imaging studies  ? ?CBC ?Recent Labs  ?Lab 05/05/21 ?2216 05/06/21 ?07/06/21 05/06/21 ?2114 05/07/21 ?0352 05/07/21 ?2057 05/08/21 ?0459 05/08/21 ?1344 05/09/21 ?0721 05/10/21 ?0342 05/11/21 ?0045 05/12/21 ?0446  ?WBC 3.3* 2.0* 2.7* 2.4* 3.2*   < > 3.6* 4.0 3.3* 4.1 4.0  ?HGB 7.3* 7.1* 7.4* 6.7* 6.8*   < > 6.2* 6.4* 6.7* 7.1* 7.9*  ?HCT 24.7* 23.1* 25.0* 21.8* 22.0*   < > 21.1* 20.4* 21.6* 23.0* 25.7*  ?PLT 72* 57* 60* 49* 49*   < > 38* 40* 52* 78* 150  ?MCV 83.7 82.2 83.3 83.5 84.0   < > 84.1 82.9 82.8 84.2 84.8  ?MCH 24.7* 25.3* 24.7* 25.7* 26.0   < > 24.7* 26.0 25.7* 26.0 26.1  ?MCHC 29.6* 30.7 29.6* 30.7 30.9   < > 29.4* 31.4 31.0 30.9 30.7  ?RDW 21.4* 21.3* 21.2* 21.5* 21.5*   < > 21.2* 21.0* 21.6* 22.1* 23.5*  ?LYMPHSABS 1.2 1.1 1.1 1.0 1.2  --   --   --   --   --   --   ?MONOABS 0.3 0.2 0.3 0.2 0.3  --   --   --   --   --   --   ?EOSABS 0.0 0.0 0.0 0.0 0.0  --   --   --   --   --   --   ?BASOSABS 0.0 0.0 0.0 0.0 0.0  --   --   --   --   --   --   ? < > = values in this interval not displayed.  ? ? ? ?Recent Labs  ?Lab 05/05/21 ?2216 05/06/21 ?07/06/21 05/06/21 ?2114 05/07/21 ?0352 05/07/21 ?2057 05/08/21 ?0459 05/08/21 ?05/10/21 05/09/21 ?05/11/21 05/10/21 ?0342 05/11/21 ?0045 05/12/21 ?0446  ?NA 138 137   < > 141 138 140  --  133* 133* 132*  134*  ?K 3.2* 3.3*   < > 3.3* 3.4* 3.2*  --  3.6 3.4* 3.5 3.0*  ?CL 99 100   < > 105 103 107  --  96* 98 98 100  ?CO2 22 22   < > 26 26 25   --  25 24 22 22   ?GLUCOSE 103* 78   < > 86 88 80  --  107* 90 97 84  ?BUN 10 8   < >  7 5* <5*  --  <5* <5* 5* 6  ?CREATININE 0.81 0.67   < > 0.60* 0.72 0.65  --  0.70 0.71 0.73 0.64  ?CALCIUM 8.8* 8.2*   < > 7.9* 8.0* 7.5*  --  8.0* 8.6* 8.9 9.4  ?AST 148* 124*   < > 92* 128* 126*  --  74*  --   --  81*  ?ALT 46* 41   < > 35 41 39  --  34  --   --  35  ?ALKPHOS 88 79   < > 71 79 70  --  81  --   --  91  ?BILITOT 0.7 0.5   < > 0.5 0.5 0.5  --  1.6*  --   --  1.1  ?ALBUMIN 3.7 3.3*   < > 3.2* 3.6 3.1*  --  3.2*  --   --  3.3*  ?MG  --  1.5*  --  1.4*  --   --  1.3*  --   --   --  1.7  ?DDIMER  --   --   --   --   --   --  1.11*  --   --   --   --   ?INR 1.0 1.0  --  1.0  --  1.0 1.1  --   --   --   --   ? < > = values in this interval not displayed.  ? ? ? ?------------------------------------------------------------------------------------------------------------------ ?No results for input(s): CHOL, HDL, LDLCALC, TRIG, CHOLHDL, LDLDIRECT in the last 72 hours. ? ?No results found for: HGBA1C ?------------------------------------------------------------------------------------------------------------------ ?No results for input(s): TSH, T4TOTAL, T3FREE, THYROIDAB in the last 72 hours. ? ?Invalid input(s): FREET3 ? ?Cardiac Enzymes ?No results for input(s): CKMB, TROPONINI, MYOGLOBIN in the last 168 hours. ? ?Invalid input(s): CK ?------------------------------------------------------------------------------------------------------------------ ?   ?Component Value Date/Time  ? BNP 286.7 (H) 11/23/2015 0221  ? ? ?CBG: ?No results for input(s): GLUCAP in the last 168 hours. ? ?Recent Results (from the past 240 hour(s))  ?Resp Panel by RT-PCR (Flu A&B, Covid) Nasopharyngeal Swab     Status: None  ? Collection Time: 05/06/21  6:48 AM  ? Specimen: Nasopharyngeal Swab; Nasopharyngeal(NP)  swabs in vial transport medium  ?Result Value Ref Range Status  ? SARS Coronavirus 2 by RT PCR NEGATIVE NEGATIVE Final  ?  Comment: (NOTE) ?SARS-CoV-2 target nucleic acids are NOT DETECTED. ? ?The SARS-CoV-2

## 2021-05-13 DIAGNOSIS — K2971 Gastritis, unspecified, with bleeding: Secondary | ICD-10-CM | POA: Diagnosis not present

## 2021-05-13 LAB — BASIC METABOLIC PANEL
Anion gap: 10 (ref 5–15)
BUN: 12 mg/dL (ref 6–20)
CO2: 23 mmol/L (ref 22–32)
Calcium: 9.4 mg/dL (ref 8.9–10.3)
Chloride: 100 mmol/L (ref 98–111)
Creatinine, Ser: 0.8 mg/dL (ref 0.61–1.24)
GFR, Estimated: >60 mL/min (ref >60)
Glucose, Bld: 109 mg/dL — ABNORMAL HIGH (ref 70–99)
Potassium: 3.3 mmol/L — ABNORMAL LOW (ref 3.5–5.1)
Sodium: 133 mmol/L — ABNORMAL LOW (ref 135–145)

## 2021-05-13 LAB — CBC
HCT: 24.3 % — ABNORMAL LOW (ref 39.0–52.0)
Hemoglobin: 7.5 g/dL — ABNORMAL LOW (ref 13.0–17.0)
MCH: 25.9 pg — ABNORMAL LOW (ref 26.0–34.0)
MCHC: 30.9 g/dL (ref 30.0–36.0)
MCV: 83.8 fL (ref 80.0–100.0)
Platelets: 190 10*3/uL (ref 150–400)
RBC: 2.9 MIL/uL — ABNORMAL LOW (ref 4.22–5.81)
RDW: 23.2 % — ABNORMAL HIGH (ref 11.5–15.5)
WBC: 5.1 10*3/uL (ref 4.0–10.5)
nRBC: 0.4 % — ABNORMAL HIGH (ref 0.0–0.2)

## 2021-05-13 LAB — MAGNESIUM: Magnesium: 1.7 mg/dL (ref 1.7–2.4)

## 2021-05-13 MED ORDER — POTASSIUM CHLORIDE CRYS ER 20 MEQ PO TBCR
40.0000 meq | EXTENDED_RELEASE_TABLET | Freq: Once | ORAL | Status: AC
  Administered 2021-05-13: 40 meq via ORAL
  Filled 2021-05-13: qty 2

## 2021-05-13 MED ORDER — PANTOPRAZOLE SODIUM 40 MG PO TBEC: 40.0000 mg | DELAYED_RELEASE_TABLET | Freq: Two times a day (BID) | ORAL | 0 refills | Status: DC

## 2021-05-13 NOTE — Discharge Summary (Signed)
? ?Physician Discharge Summary  ?Khale Nigh RDE:[id number] DOB: 10-03-1965 DOA: 05/07/2021 ? ?PCP: Pcp, No ? ?Admit date: 05/07/2021 ?Discharge date: 05/13/2021 ? ?Admitted From: home ?Disposition:  home ? ?Recommendations for Outpatient Follow-up:  ?Follow up with PCP in 1-2 weeks ?Please obtain BMP/CBC in one week ? ?Home Health: none ?Equipment/Devices: none ? ?Discharge Condition: stable ?CODE STATUS: Full code ?Diet recommendation: regular ? ?HPI: Per admitting MD, ?Rose Hippler is a 56 y.o. male with medical history significant for alcoholism and essential hypertension, who presented to the emergency room with acute onset of hematemesis.  He has been evaluated over the last couple of days and seen.  He was admitted here twice over the last couple of and left AGAINST MEDICAL ADVICE after potential plan for EGD.  Dr. Barron Alvine with [facility] was notified about the patient yesterday a.m.  The patient's hemoglobin has been gradually dropping from 8.5-6.8 over the last couple of days.  He was typed and crossmatched and was ordered 1 unit of packed red blood cells yesterday that he refused.  He stated that he had to go to court before he left.  He had recurrent hematemesis after he went home.  His last alcoholic drink was at fifth prior to arrival.  He admits to upper abdominal pain.  He admitted to melena without bright red blood per rectum.  No fever or chills.  No dysuria, oliguria or hematuria or flank pain.  No chest pain or palpitations.  No other bleeding diathesis.  He has been having chills and diaphoresis.  He has been having ? ?Hospital Course / Discharge diagnoses: ?Principal Problem: ?  GI bleeding ?Active Problems: ?  Acute on chronic anemia ?  Alcohol dependence syndrome (HCC) ?  Colitis, acute ?  Pancreatic insufficiency ?  Tobacco abuse ?  Acute bronchitis ?  Thrombocytopenia (HCC) ?  Leukopenia ? ? ?Assessment and Plan: ?Principal problem ?GI bleeding-This likely upper GI bleeding given his  hematemesis.  GI consulted, no plans for endoscopic intervention due to high risk procedure.  Seems to have stopped bleeding, hemoglobin is stable and improving on its own, continue Protonix, will change to oral twice daily at the time of discharge.  Tolerating regular diet ?  ?Active problems ?Acute on chronic blood loss anemia -This is acute blood loss anemia.  Hemoglobin was as low as 5.9, he refused blood transfusions.  Fortunately, with resolution of his bleeding hemoglobin has improved and is now above 7  ?Thrombocytopenia-due to EtOH use, platelets normalized ?Leukopenia-overall stable, WBC normalized  ?Questionable gastroenteritis-no significant symptoms, possibly gut edema.  Has remained stable off antibiotics  ?Alcohol dependence, alcohol induced hepatic steatosis, alcohol withdrawal-Discriminant function less than 32, he has no evidence of ascites on recent imaging and no varices on EGD 6 months ago.  Patient had significant DTs/hallucinations for the first 2-3 days, he has improved, alert and oriented x4, PT recommends SNF but he refuses that and chooses to go back to his shelter. ?Pancreatic insufficiency - minimally elevated lipase. Continue his Creon. ?Tobacco abuse -He was counseled for smoking cessation  ?Medication nonadherence-counseled to take all his home medications ? ?Sepsis ruled out ? ?Discharge Instructions ? ? ?Allergies as of 05/13/2021   ?No Known Allergies ?  ? ?  ?Medication List  ?  ? ?TAKE these medications   ? ?acetaminophen 325 MG tablet ?Commonly known as: TYLENOL ?Take 2 tablets (650 mg total) by mouth every 6 (six) hours as needed for fever, moderate pain, mild pain or headache. ?  ?  Ferrous Gluconate 239 (27 Fe) MG Tabs ?Take 1 tablet by mouth every Monday, Wednesday, and Friday. ?  ?folic acid 1 MG tablet ?Commonly known as: FOLVITE ?Take 1 tablet (1 mg total) by mouth daily. ?  ?lipase/protease/amylase 25638 UNITS Cpep capsule ?Commonly known as: CREON ?Take 1 capsule (36,000  Units total) by mouth 3 (three) times daily before meals. ?  ?mineral oil-hydrophilic petrolatum ointment ?Apply 1 application topically 2 (two) times daily. Apply to both nostrils twice a day ?  ?multivitamin with minerals Tabs tablet ?Take 1 tablet by mouth daily. ?  ?oxymetazoline 0.05 % nasal spray ?Commonly known as: AFRIN ?Place 3 sprays into both nostrils as needed (Bleeding). ?  ?pantoprazole 40 MG tablet ?Commonly known as: PROTONIX ?Take 1 tablet (40 mg total) by mouth 2 (two) times daily before a meal. ?What changed: when to take this ?  ?sodium chloride 0.65 % Soln nasal spray ?Commonly known as: OCEAN ?Place 2 sprays into both nostrils every 4 (four) hours while awake. ?  ?sucralfate 1 g tablet ?Commonly known as: Carafate ?Take 1 tablet (1 g total) by mouth 4 (four) times daily -  with meals and at bedtime. ?  ?thiamine 100 MG tablet ?Take 1 tablet (100 mg total) by mouth daily. ?  ?thiamine 100 MG tablet ?Take 1 tablet (100 mg total) by mouth daily. ?  ? ?  ? ? Follow-up Information   ? ? [facility]. Schedule an appointment as soon as possible for a visit in 2 week(s).   ?Contact information: ?[address] ?[place] [address] ?[phone] ? ?  ?  ? ?  ?  ? ?  ? ? ?Consultations: ?GI ? ?Procedures/Studies: ? ?CT Abdomen Pelvis W Contrast ? ?Result Date: 05/06/2021 ?CLINICAL DATA:  Nausea, vomiting and abdominal pain with previous history of pancreatitis. Hematemesis x1 week with 2 episodes in the past 24 hours. EXAM: CT ABDOMEN AND PELVIS WITH CONTRAST TECHNIQUE: Multidetector CT imaging of the abdomen and pelvis was performed using the standard protocol following bolus administration of intravenous contrast. RADIATION DOSE REDUCTION: This exam was performed according to the departmental dose-optimization program which includes automated exposure control, adjustment of the mA and/or kV according to patient size and/or use of iterative  reconstruction technique. CONTRAST:  OMNIPAQUE IOHEXOL 300 MG/ML  SOLN COMPARISON:  CTs with IV contrast 12/07/2020 and 09/16/2020. FINDINGS: Lower chest: There is respiratory motion on exam. Asymmetric haziness in the right lower lobe is seen and could be breathing motion artifact, asymmetric microatelectasis, or pneumonitis. Lung bases are otherwise clear.  The cardiac size is normal. Hepatobiliary: 20.3 cm length liver with moderate to severe steatosis. No portal vein dilatation. Gallbladder and bile ducts unremarkable. 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 Areas of focal edema in the head and uncinate processes of the pancreas on the previous exam are also no longer seen. There is no mass enhancement or ductal dilatation. Spleen: Normal in size with uniform enhancement. Adrenals/Urinary Tract: There is no adrenal mass or focal abnormality of the renal cortex. There is no evidence of urinary stones or obstruction. The bladder is mildly thickened but this was also seen on prior studies. Stomach/Bowel: The gastric wall is moderately thickened but no more than previously. Similar moderate thickening of the descending duodenum as well. There are mild thickened folds in some of the left abdominal small bowel but there is no small  bowel obstruction or inflammation. Appendix is not seen in this patient. There is mild wall prominence versus underdistention of the ascending colon. There are colonic diverticula without evidence of focal inflammation. Vascular/Lymphatic: Moderate aortoiliac calcific plaque. No AAA. No adenopathy. Reproductive: Mildly enlarged prostate impressing on the bladder base. Other: Prior umbilical hernia repair, without recurrence. No free air, hemorrhage or free fluid. Musculoskeletal: Advanced degenerative disc disease, spondylosis and reactive endplate sclerosis again noted L5-S1 with disc  osteophyte complex encroaching on both S1 nerve roots with a least mild S1 nerve root compression. Severe bilateral foraminal stenosis. Lesser degenerative disc disease L4-5. Bilateral SI joint ankylosis ante

## 2021-05-13 NOTE — Discharge Instructions (Signed)
Follow with PCP in 5-7 days  Please get a complete blood count and chemistry panel checked by your Primary MD at your next visit, and again as instructed by your Primary MD. Please get your medications reviewed and adjusted by your Primary MD.  Please request your Primary MD to go over all Hospital Tests and Procedure/Radiological results at the follow up, please get all Hospital records sent to your Prim MD by signing hospital release before you go home.  In some cases, there will be blood work, cultures and biopsy results pending at the time of your discharge. Please request that your primary care M.D. goes through all the records of your hospital data and follows up on these results.  If you had Pneumonia of Lung problems at the Hospital: Please get a 2 view Chest X ray done in 6-8 weeks after hospital discharge or sooner if instructed by your Primary MD.  If you have Congestive Heart Failure: Please call your Cardiologist or Primary MD anytime you have any of the following symptoms:  1) 3 pound weight gain in 24 hours or 5 pounds in 1 week  2) shortness of breath, with or without a dry hacking cough  3) swelling in the hands, feet or stomach  4) if you have to sleep on extra pillows at night in order to breathe  Follow cardiac low salt diet and 1.5 lit/day fluid restriction.  If you have diabetes Accuchecks 4 times/day, Once in AM empty stomach and then before each meal. Log in all results and show them to your primary doctor at your next visit. If any glucose reading is under 80 or above 300 call your primary MD immediately.  If you have Seizure/Convulsions/Epilepsy: Please do not drive, operate heavy machinery, participate in activities at heights or participate in high speed sports until you have seen by Primary MD or a Neurologist and advised to do so again. Per [place] DMV statutes, patients with seizures are not allowed to drive until they have been seizure-free for six  months.  Use caution when using heavy equipment or power tools. Avoid working on ladders or at heights. Take showers instead of baths. Ensure the water temperature is not too high on the home water heater. Do not go swimming alone. Do not lock yourself in a room alone (i.e. bathroom). When caring for infants or small children, sit down when holding, feeding, or changing them to minimize risk of injury to the child in the event you have a seizure. Maintain good sleep hygiene. Avoid alcohol.   If you had Gastrointestinal Bleeding: Please ask your Primary MD to check a complete blood count within one week of discharge or at your next visit. Your endoscopic/colonoscopic biopsies that are pending at the time of discharge, will also need to followed by your Primary MD.  Get Medicines reviewed and adjusted. Please take all your medications with you for your next visit with your Primary MD  Please request your Primary MD to go over all hospital tests and procedure/radiological results at the follow up, please ask your Primary MD to get all Hospital records sent to his/her office.  If you experience worsening of your admission symptoms, develop shortness of breath, life threatening emergency, suicidal or homicidal thoughts you must seek medical attention immediately by calling 911 or calling your MD immediately  if symptoms less severe.  You must read complete instructions/literature along with all the possible adverse reactions/side effects for all the Medicines you take and that   have been prescribed to you. Take any new Medicines after you have completely understood and accpet all the possible adverse reactions/side effects.   Do not drive or operate heavy machinery when taking Pain medications.   Do not take more than prescribed Pain, Sleep and Anxiety Medications  Special Instructions: If you have smoked or chewed Tobacco  in the last 2 yrs please stop smoking, stop any regular Alcohol  and or any  Recreational drug use.  Wear Seat belts while driving.  Please note You were cared for by a hospitalist during your hospital stay. If you have any questions about your discharge medications or the care you received while you were in the hospital after you are discharged, you can call the unit and asked to speak with the hospitalist on call if the hospitalist that took care of you is not available. Once you are discharged, your primary care physician will handle any further medical issues. Please note that NO REFILLS for any discharge medications will be authorized once you are discharged, as it is imperative that you return to your primary care physician (or establish a relationship with a primary care physician if you do not have one) for your aftercare needs so that they can reassess your need for medications and monitor your lab values.  You can reach the hospitalist office at phone [phone] or fax [phone]   If you do not have a primary care physician, you can call [phone] for a physician referral.  Activity: As tolerated with Full fall precautions use walker/cane & assistance as needed    Diet: regular  Disposition Home  

## 2021-05-29 ENCOUNTER — Emergency Department (HOSPITAL_COMMUNITY)
Admission: EM | Admit: 2021-05-29 | Discharge: 2021-05-29 | Disposition: A | Discharge status: Home / Self Care | Payer: 59 | Attending: Emergency Medicine | Admitting: Emergency Medicine

## 2021-05-29 ENCOUNTER — Encounter (HOSPITAL_COMMUNITY): Payer: Self-pay | Admitting: Emergency Medicine

## 2021-05-29 DIAGNOSIS — Z5321 Procedure and treatment not carried out due to patient leaving prior to being seen by health care provider: Secondary | ICD-10-CM | POA: Diagnosis not present

## 2021-05-29 DIAGNOSIS — R109 Unspecified abdominal pain: Secondary | ICD-10-CM | POA: Insufficient documentation

## 2021-05-29 LAB — URINALYSIS, ROUTINE W REFLEX MICROSCOPIC
Bacteria, UA: NONE SEEN
Bilirubin Urine: NEGATIVE
Glucose, UA: NEGATIVE mg/dL
Hgb urine dipstick: NEGATIVE
Ketones, ur: NEGATIVE mg/dL
Leukocytes,Ua: NEGATIVE
Nitrite: NEGATIVE
Protein, ur: 30 mg/dL — AB
Specific Gravity, Urine: 1.009 (ref 1.005–1.030)
pH: 5 (ref 5.0–8.0)

## 2021-05-29 NOTE — ED Notes (Signed)
Patient observed by registration getting in hospital shuttle ?

## 2021-05-29 NOTE — ED Triage Notes (Addendum)
Per pt, states he is coming from the [facility]-states abdominal pain for 2 weeks-has been hospitalized for GI bleed in the past-patient has been drinking ETOH ?

## 2021-07-04 ENCOUNTER — Other Ambulatory Visit (HOSPITAL_COMMUNITY): Payer: Self-pay

## 2021-07-04 ENCOUNTER — Encounter: Payer: Self-pay | Admitting: Critical Care Medicine

## 2021-07-04 ENCOUNTER — Other Ambulatory Visit: Payer: Self-pay | Admitting: Critical Care Medicine

## 2021-07-04 DIAGNOSIS — Z5900 Homelessness unspecified: Secondary | ICD-10-CM | POA: Insufficient documentation

## 2021-07-04 DIAGNOSIS — F32A Depression, unspecified: Secondary | ICD-10-CM | POA: Insufficient documentation

## 2021-07-04 DIAGNOSIS — K2970 Gastritis, unspecified, without bleeding: Secondary | ICD-10-CM

## 2021-07-04 DIAGNOSIS — F1010 Alcohol abuse, uncomplicated: Secondary | ICD-10-CM | POA: Insufficient documentation

## 2021-07-04 DIAGNOSIS — M1990 Unspecified osteoarthritis, unspecified site: Secondary | ICD-10-CM | POA: Insufficient documentation

## 2021-07-04 MED ORDER — SUCRALFATE 1 G PO TABS
1.0000 g | ORAL_TABLET | Freq: Three times a day (TID) | ORAL | 0 refills | Status: DC
Start: 2021-07-04 — End: 2021-07-05
  Filled 2021-07-04: qty 40, 10d supply, fill #0

## 2021-07-04 MED ORDER — PANTOPRAZOLE SODIUM 40 MG PO TBEC
40.0000 mg | DELAYED_RELEASE_TABLET | Freq: Two times a day (BID) | ORAL | 0 refills | Status: DC
Start: 2021-07-04 — End: 2021-07-05
  Filled 2021-07-04: qty 60, 30d supply, fill #0

## 2021-07-04 MED ORDER — PANCRELIPASE (LIP-PROT-AMYL) 36000-114000 UNITS PO CPEP
36000.0000 [IU] | ORAL_CAPSULE | Freq: Three times a day (TID) | ORAL | 3 refills | Status: DC
  Filled 2021-07-04: qty 180, 60d supply, fill #0

## 2021-07-05 ENCOUNTER — Encounter: Payer: Self-pay | Admitting: *Deleted

## 2021-07-05 ENCOUNTER — Other Ambulatory Visit (HOSPITAL_COMMUNITY): Payer: Self-pay

## 2021-07-05 ENCOUNTER — Other Ambulatory Visit: Payer: Self-pay | Admitting: Critical Care Medicine

## 2021-07-05 ENCOUNTER — Other Ambulatory Visit: Payer: Self-pay | Admitting: *Deleted

## 2021-07-05 MED ORDER — THIAMINE HCL 100 MG PO TABS
100.0000 mg | ORAL_TABLET | Freq: Every day | ORAL | 0 refills | Status: DC
  Filled 2021-07-05: qty 100, 100d supply, fill #0

## 2021-07-05 MED ORDER — THIAMINE HCL 100 MG PO TABS
100.0000 mg | ORAL_TABLET | Freq: Every day | ORAL | 0 refills | Status: DC
Start: 2021-07-05 — End: 2021-07-05
  Filled 2021-07-05: qty 30, 30d supply, fill #0

## 2021-07-05 MED ORDER — PANCRELIPASE (LIP-PROT-AMYL) 36000-114000 UNITS PO CPEP
36000.0000 [IU] | ORAL_CAPSULE | Freq: Three times a day (TID) | ORAL | 3 refills | Status: DC
  Filled 2021-07-05: qty 180, 60d supply, fill #0

## 2021-07-05 MED ORDER — SUCRALFATE 1 G PO TABS
1.0000 g | ORAL_TABLET | Freq: Three times a day (TID) | ORAL | 0 refills | Status: DC
Start: 2021-07-05 — End: 2021-08-02
  Filled 2021-07-05: qty 40, 10d supply, fill #0

## 2021-07-05 MED ORDER — FOLIC ACID 1 MG PO TABS
1.0000 mg | ORAL_TABLET | Freq: Every day | ORAL | 0 refills | Status: DC
  Filled 2021-07-05: qty 30, 30d supply, fill #0

## 2021-07-05 MED ORDER — PANTOPRAZOLE SODIUM 40 MG PO TBEC
40.0000 mg | DELAYED_RELEASE_TABLET | Freq: Two times a day (BID) | ORAL | 0 refills | Status: DC
  Filled 2021-07-05: qty 60, 30d supply, fill #0

## 2021-07-05 NOTE — Progress Notes (Signed)
Patient ID: Samuel Blevins, male   DOB: [birth year], 56 y.o.   MRN: [id number] ?I saw this patient at the [facility] and is a former primary care patient of mine who was lost to follow-up.  This is a 56 year old male with severe alcohol abuse.  He has been drinking up to half a gallon of vodka a day for many months.  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  Note he has had multiple hospitalizations since I last lost him to follow-up in March 2022.  He has had 4 hospitalizations and many other emergency room visits for his alcoholism. ? ?He was in the shelter and was evicted from her homeless shelter because of behavior and then ended up getting housing but could not pay his rent.  He was evicted from his house and now is back at the homeless shelter.  He just was admitted today on 10 May to the homeless shelter. ? ?Below is a copy of the last discharge summary. ? ?Admit date: 05/07/2021 ?Discharge date: 05/13/2021 ?  ?Admitted From: home ?Disposition:  home ?  ?Recommendations for Outpatient Follow-up:  ?Follow up with PCP in 1-2 weeks ?Please obtain BMP/CBC in one week ?  ?Home Health: none ?Equipment/Devices: none ?  ?Discharge Condition: stable ?CODE STATUS: Full code ?Diet recommendation: regular ?  ?HPI: Per admitting MD, ?Samuel Blevins is a 56 y.o. male with medical history significant for alcoholism and essential hypertension, who presented to the emergency room with acute onset of hematemesis.  He has been evaluated over the last couple of days and seen.  He was admitted here twice over the last couple of and left AGAINST MEDICAL ADVICE after potential plan for EGD.  Dr. Barron Alvine with [facility] was notified about the patient yesterday a.m.  The patient's hemoglobin has been gradually dropping from 8.5-6.8 over the last couple of days.  He was typed and crossmatched and was ordered 1 unit of packed red blood cells yesterday that he refused.  He stated that he had to go to court  before he left.  He had recurrent hematemesis after he went home.  His last alcoholic drink was at fifth prior to arrival.  He admits to upper abdominal pain.  He admitted to melena without bright red blood per rectum.  No fever or chills.  No dysuria, oliguria or hematuria or flank pain.  No chest pain or palpitations.  No other bleeding diathesis.  He has been having chills and diaphoresis.  He has been having ?  ?Hospital Course / Discharge diagnoses: ?Principal Problem: ?  GI bleeding ?Active Problems: ?  Acute on chronic anemia ?  Alcohol dependence syndrome (HCC) ?  Colitis, acute ?  Pancreatic insufficiency ?  Tobacco abuse ?  Acute bronchitis ?  Thrombocytopenia (HCC) ?  Leukopenia ?  ?  ?Assessment and Plan: ?Principal problem ?GI bleeding-This likely upper GI bleeding given his hematemesis.  GI consulted, no plans for endoscopic intervention due to high risk procedure.  Seems to have stopped bleeding, hemoglobin is stable and improving on its own, continue Protonix, will change to oral twice daily at the time of discharge.  Tolerating regular diet ?  ?Active problems ?Acute on chronic blood loss anemia -This is acute blood loss anemia.  Hemoglobin was as low as 5.9, he refused blood transfusions.  Fortunately, with resolution of his bleeding hemoglobin has improved and is now above 7  ?Thrombocytopenia-due to EtOH use, platelets normalized ?Leukopenia-overall stable, WBC normalized  ?Questionable  gastroenteritis-no significant symptoms, possibly gut edema.  Has remained stable off antibiotics  ?Alcohol dependence, alcohol induced hepatic steatosis, alcohol withdrawal-Discriminant function less than 32, he has no evidence of ascites on recent imaging and no varices on EGD 6 months ago.  Patient had significant DTs/hallucinations for the first 2-3 days, he has improved, alert and oriented x4, PT recommends SNF but he refuses that and chooses to go back to his shelter. ?Pancreatic insufficiency - minimally  elevated lipase. Continue his Creon. ?Tobacco abuse -He was counseled for smoking cessation  ?Medication nonadherence-counseled to take all his home medications ?  ?Sepsis ruled out ? ?Note this patient's had alcohol withdrawal in the past.  He does have pancreatic insufficiency tobacco use leukopenia anemia and had refused blood transfusions.  The patient a did not receive an endoscopy during that last visit because of high risk. ? ?Patient's bleeding stopped and his DTs resolved. ? ?Patient refused nursing home placement. ? ?The patient is out of all his medications including his blood pressure medicine. ? ?Today blood pressure mildly elevated 145/82 ? ?Patient does need repeat screening labs ? ?On exam the patient's abdomen is protuberant and tender in the epigastric area he also has discoordinated gait suggestive of a Warnicke's type syndrome ? ?My plan is to try to get this patient back into our clinic for labs and also to get him back into gastroenterology deceiving get an upper endoscopy performed ? ?He ultimately needs medical detox but needs to be stabilized medically before he can go to such a environment ? ?I will partner with our nurse case manager at the clinic and we will endeavor to get this patient into the clinic as soon as possible ? ?I gave the patient samples of omeprazole from over-the-counter stock and I did send a prescription in for Carafate 1 g 4 times daily and pantoprazole 40 mg twice daily and the nurse will retrieve that and give to the patient ?

## 2021-07-09 ENCOUNTER — Ambulatory Visit: Payer: 59 | Admitting: Critical Care Medicine

## 2021-07-09 NOTE — Progress Notes (Incomplete)
? ?Established Patient Office Visit ? ?Subjective   ?Patient ID: Samuel Blevins, male    DOB: 1966-01-28  Age: 56 y.o. MRN: [id number] ? ?No chief complaint on file. ? ? ?07/04/21 Samuel Blevins clinic: ? ?I saw this patient at the [facility] clinic and is a former primary care patient of mine who was lost to follow-up.  This is a 56 year old male with severe alcohol abuse.  He has been drinking up to half a gallon of vodka a day for many months.  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  Note he has had multiple hospitalizations since I last lost him to follow-up in March 2022.  He has had 4 hospitalizations and many other emergency room visits for his alcoholism. ?? ?He was in the shelter and was evicted from her homeless shelter because of behavior and then ended up getting housing but could not pay his rent.  He was evicted from his house and now is back at the homeless shelter.  He just was admitted today on 10 May to the homeless shelter. ?? ?Below is a copy of the last discharge summary. ?? ?Admit date:?05/07/2021 ?Discharge date:?05/13/2021 ?? ?Admitted From:?home ?Disposition:??home ?? ?Recommendations for Outpatient Follow-up:? ?1. Follow up with PCP in 1-2 weeks ?2. Please obtain BMP/CBC in one week ?? ?Home Health:?none ?Equipment/Devices:?none ?? ?Discharge Condition:?stable ?CODE STATUS:?Full code ?Diet recommendation:?regular ?? ?HPI:?Per admitting MD, ?Samuel Blevins?is a 56 y.o.?male?with medical history significant for?alcoholism and essential hypertension, who presented to the emergency room with acute onset of hematemesis. ?He has been evaluated over the last couple of days and seen. ?He was admitted here twice over the last couple of and left AGAINST MEDICAL ADVICE after potential plan for EGD. ?Samuel Blevins with [facility] was notified about the patient yesterday a.m. ?The patient's hemoglobin has been gradually dropping from 8.5-6.8 over the last couple of days. ?He was typed and  crossmatched and was ordered 1 unit of packed red blood cells yesterday that he refused. ?He stated that he had to go to court before he left. ?He had recurrent hematemesis after he went home. ?His last alcoholic drink was at fifth?prior to arrival. ?He admits to upper abdominal pain. ?He?admitted to melena?without?bright red blood per rectum. ?No fever or chills. ?No dysuria, oliguria or hematuria or flank pain. ?No chest pain or palpitations. ?No other bleeding diathesis.??He has been having chills and diaphoresis. ?He has been having ?? ?Hospital Course / Discharge diagnoses: ?Principal Problem: ???GI bleeding ?Active Problems: ???Acute on chronic anemia ???Alcohol dependence syndrome (HCC) ???Colitis, acute ???Pancreatic insufficiency ???Tobacco abuse ???Acute bronchitis ???Thrombocytopenia (HCC) ???Leukopenia ?? ?? ?Assessment and Plan: ?Principal problem ?GI bleeding-This likely upper GI bleeding given his hematemesis. ?GI consulted, no plans for endoscopic intervention due to high risk procedure. ?Seems to have stopped bleeding, hemoglobin is stable?and improving on its own, continue Protonix, will change to oral twice daily at the time of discharge.??Tolerating regular diet ?? ?Active problems ?Acute on chronic blood loss anemia -This is acute blood loss anemia.??Hemoglobin was as low as 5.9, he refused blood transfusions. ?Fortunately, with resolution of his bleeding hemoglobin?has improved and is now above 7? ?Thrombocytopenia-due to EtOH use,?platelets normalized ?Leukopenia-overall stable, WBC normalized? ?Questionable gastroenteritis-no significant symptoms, possibly gut edema.??Has remained stable off antibiotics? ?Alcohol dependence, alcohol induced hepatic steatosis, alcohol withdrawal-Discriminant function less than 32, he has no evidence of ascites on recent imaging and no varices on EGD 6 months ago. ?Patient had significant DTs/hallucinations for the first 2-3 days, he has improved, alert  and  oriented x4, PT recommends SNF but he refuses that and chooses to go back to his shelter. ?Pancreatic insufficiency - minimally elevated lipase. Continue his Creon. ?Tobacco abuse -He was counseled for smoking cessation? ?Medication nonadherence-counseled to take all his home medications ?? ?Sepsis ruled out ?? ?Note this patient's had alcohol withdrawal in the past.  He does have pancreatic insufficiency tobacco use leukopenia anemia and had refused blood transfusions.  The patient a did not receive an endoscopy during that last visit because of high risk. ? ?Patient's bleeding stopped and his DTs resolved. ?? ?Patient refused nursing home placement. ?? ?The patient is out of all his medications including his blood pressure medicine. ?? ?Today blood pressure mildly elevated 145/82 ?? ?Patient does need repeat screening labs ?? ?On exam the patient's abdomen is protuberant and tender in the epigastric area he also has discoordinated gait suggestive of a Warnicke's type syndrome ?? ?My plan is to try to get this patient back into our clinic for labs and also to get him back into gastroenterology deceiving get an upper endoscopy performed ?? ?He ultimately needs medical detox but needs to be stabilized medically before he can go to such a environment ?? ?I will partner with our nurse case manager at the clinic and we will endeavor to get this patient into the clinic as soon as possible ?? ?I gave the patient samples of omeprazole from over-the-counter stock and I did send a prescription in for Carafate 1 g 4 times daily and pantoprazole 40 mg twice daily and the nurse will retrieve that and give to the patient ? ?07/10/21 ? ? ? ?{History (Optional):23778} ? ?ROS ? ?  ?Objective:  ?  ? ?There were no vitals taken for this visit. ?{Vitals History (Optional):23777} ? ?Physical Exam ? ? ?No results found for any visits on 07/09/21. ? ?{Labs (Optional):23779} ? ?The ASCVD Risk score (Arnett DK, et al., 2019) failed to  calculate for the following reasons: ?  Cannot find a previous HDL lab ?  Cannot find a previous total cholesterol lab ? ?  ?Assessment & Plan:  ? ?Problem List Items Addressed This Visit   ?None ? ? ?No follow-ups on file.  ? ? ?Shan Levans, MD ? ?

## 2021-07-11 ENCOUNTER — Encounter: Payer: Self-pay | Admitting: Critical Care Medicine

## 2021-07-12 ENCOUNTER — Encounter: Payer: Self-pay | Admitting: Critical Care Medicine

## 2021-07-12 ENCOUNTER — Ambulatory Visit (HOSPITAL_BASED_OUTPATIENT_CLINIC_OR_DEPARTMENT_OTHER): Payer: 59 | Admitting: Critical Care Medicine

## 2021-07-12 ENCOUNTER — Encounter: Payer: Self-pay | Admitting: *Deleted

## 2021-07-12 ENCOUNTER — Telehealth: Payer: Self-pay | Admitting: Gastroenterology

## 2021-07-12 VITALS — Wt 173.0 lb

## 2021-07-12 DIAGNOSIS — Z5320 Procedure and treatment not carried out because of patient's decision for unspecified reasons: Secondary | ICD-10-CM

## 2021-07-12 NOTE — Telephone Encounter (Signed)
Spoke with Dr. [name] who requested to cancel referral. Pt walked out of clinic and refused to cooperate with staff and stated he's going to [facility]. Pt continues to drink alcohol and has hepatic encephalopathy and has been vomiting blood.

## 2021-07-12 NOTE — Congregational Nurse Program (Signed)
Pt met RN this am to say he is ready for appt at dr wright's office at [facility]. Uber arranged and pt on his way.

## 2021-07-12 NOTE — Telephone Encounter (Addendum)
We received a referral from PCP for Gastritis and gastroduodenitis.   We also received a call from PCP (07/12/21) Dr. [name] requesting patient be seen urgently by Dr. Barron Alvine.   Patient has had EGD in 2022 with Dr. Chales Abrahams, and 2021 with Dr. Barron Alvine.   Are we able to schedule for an OV with Dr. Barron Alvine or an APP asap?  If so, Dr. [name] states to give him a call directly (pt is currently homeless, and without a telephone). They will also coordinate transportation for patient.   Dr. Shan Levans [phone]

## 2021-07-12 NOTE — Progress Notes (Signed)
This patient arrived for his visit at 42 from the homeless shelter.  As he was being brought back with the nurse he began to say that he did not want to stay for the visit.  He started turnaround to walk out.  I came down the hallway to engage with the patient and ask him to please not leave so that we can see him he said he did not want to stay for the visit he was going to go to [facility].  I told him he was at [facility] health and he needs to stay here so we can get him evaluated with his stomach and other conditions.  The patient elected to not stay and walked out of the clinic.  I proceeded to call the homeless Chief of Staff and the nurse at the homeless shelter [facility] to inform them the patient did not stay for his visit.  I had already made an effort to get him an appointment with gastroenterology I will have to let them know he is not engaging.  My suspicion is he will continue to deteriorate and have a significant gastrointestinal bleed.  He is still drinking 1/2 pint a day of vodka does not seem to be engaged around quitting.  I will remove my name from his chart as his primary care provider since he is not engaging in care

## 2021-07-12 NOTE — Progress Notes (Signed)
This is a 56 year old male who I see in primary care at the clinic and here at [facility].  Patient has significant alcohol abuse.  He is drinking about a half a pint a day of vodka.  He has been having emesis 1-2 times nightly of blood.  He had an appointment Monday with me at the clinic and he went to the wrong location got lost and came back.  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  He also has ambulatory issues with alcoholic neuropathy.  Also been trying to get him into gastroenterology and he is on the work queue.  We will get him back in with me tomorrow morning at 10 AM

## 2021-07-18 ENCOUNTER — Encounter: Payer: Self-pay | Admitting: Physician Assistant

## 2021-07-18 NOTE — Progress Notes (Addendum)
56 yo with etoh abuse, HTN, hx GI bleeding who asked me to see him for diarrhea.  He notes diarrhea since this weekend.  Asking for something to help him with the diarrhea.  The diarrhea is bloody.  Also notes hematemesis.  GI bleeding has been going on for months.   VS BP 123/66, HR 102, 100% RA - negative orthostatics  GI bleeding: encouraged him to go to the ED for care due to risk of worsening bleeding, death, or other complications.  He expresses understanding, but doesn't want to go today, wants to go tomorrow (says if things get worse he'll go tonight).  Lacretia Nicks, MD  _________________________  Pt seen by Dr Lowell Guitar.  Pt went to appt w/ Dr [name], but left because he had to wait too long.  He has been having diarrhea since Sunday, has soiled himself at times. Not aware of any other residents w/ diarrhea.  Not eating well because he drinks a lot of liquor. He thinks it is because he drank something new. He is back to his old stuff, the diarrhea is a little better now. He has dark red blood in his stool.   He has been vomiting blood, mostly in the morning.   He gets light-headed when he walks, uses a rollator.  He has been having cramping pain in his toes at night.   123/66,102, 100% sitting, 124/77, 102, 100% standing  In March, he left AMA because they would not let him smoke in the room.   After reviewing his history and labs, acute hospitalization was recommended. He repeatedly refuses this.   He requests Imodium, but with the cause of his diarrhea beeing bleeding, that will not help.   He then stated he had to use the restroom and left to go to the bathroom.   When he returned, he reiterated that he would not go to the hospital. He says he has something to do in the morning. He will touch base w/ Myriam Jacobson tomorrow.   He left because he needs to vomit.   He asks for prayers.  Theodore Demark, PA-C 07/18/2021 4:45 PM

## 2021-08-01 ENCOUNTER — Encounter: Payer: Self-pay | Admitting: Physician Assistant

## 2021-08-01 NOTE — Progress Notes (Signed)
His nose is bleeding in the morning.  Has cut back on ETOH, drinking a pint a day, down from 1/2 gallon.  Thinks he needs to be on BP meds.   BP today was 132/82, HR 100, O2 100%  Has problems w/ back and pelvic pain. Also abd pain.   He feels that he needs help.   He is still vomiting red blood and having dark bloody diarrhea first thing in the morning. He describes coughing up blood as well.   He expresses a willingness to go to rehab and/or take Naltrexone.  PW got him an appt tomorrow at 9:00 am. Pt is willing to do this but was cautioned that he must not drink beforehand so that he can wait for the appt and not get impatient. Transportation will be arranged.   Rosaria Ferries, PA-C 08/01/2021 2:59 PM

## 2021-08-02 ENCOUNTER — Inpatient Hospital Stay (HOSPITAL_COMMUNITY)
Admission: EM | Admit: 2021-08-02 | Discharge: 2021-08-04 | DRG: 438 | Discharge status: Against Medical Advice | Payer: 59 | Attending: Family Medicine | Admitting: Family Medicine

## 2021-08-02 ENCOUNTER — Emergency Department (HOSPITAL_COMMUNITY)
Admission: EM | Admit: 2021-08-02 | Discharge: 2021-08-02 | Discharge status: Against Medical Advice | Payer: 59 | Source: Home / Self Care | Attending: Emergency Medicine | Admitting: Emergency Medicine

## 2021-08-02 ENCOUNTER — Other Ambulatory Visit: Payer: Self-pay | Admitting: Critical Care Medicine

## 2021-08-02 ENCOUNTER — Ambulatory Visit: Payer: 59 | Attending: Critical Care Medicine | Admitting: Critical Care Medicine

## 2021-08-02 ENCOUNTER — Encounter (HOSPITAL_COMMUNITY): Payer: Self-pay

## 2021-08-02 ENCOUNTER — Other Ambulatory Visit: Payer: Self-pay

## 2021-08-02 ENCOUNTER — Other Ambulatory Visit (HOSPITAL_COMMUNITY): Payer: Self-pay

## 2021-08-02 ENCOUNTER — Encounter: Payer: Self-pay | Admitting: Critical Care Medicine

## 2021-08-02 VITALS — BP 147/71 | HR 95 | Temp 98.3°F | Resp 16 | Wt 175.0 lb

## 2021-08-02 DIAGNOSIS — I81 Portal vein thrombosis: Secondary | ICD-10-CM | POA: Diagnosis present

## 2021-08-02 DIAGNOSIS — D539 Nutritional anemia, unspecified: Secondary | ICD-10-CM | POA: Diagnosis present

## 2021-08-02 DIAGNOSIS — G894 Chronic pain syndrome: Secondary | ICD-10-CM | POA: Diagnosis present

## 2021-08-02 DIAGNOSIS — F10931 Alcohol use, unspecified with withdrawal delirium: Secondary | ICD-10-CM

## 2021-08-02 DIAGNOSIS — K7010 Alcoholic hepatitis without ascites: Secondary | ICD-10-CM

## 2021-08-02 DIAGNOSIS — F32A Depression, unspecified: Secondary | ICD-10-CM

## 2021-08-02 DIAGNOSIS — K2990 Gastroduodenitis, unspecified, without bleeding: Secondary | ICD-10-CM

## 2021-08-02 DIAGNOSIS — F1020 Alcohol dependence, uncomplicated: Secondary | ICD-10-CM | POA: Diagnosis present

## 2021-08-02 DIAGNOSIS — K852 Alcohol induced acute pancreatitis without necrosis or infection: Secondary | ICD-10-CM

## 2021-08-02 DIAGNOSIS — F1010 Alcohol abuse, uncomplicated: Secondary | ICD-10-CM

## 2021-08-02 DIAGNOSIS — Z5901 Sheltered homelessness: Secondary | ICD-10-CM

## 2021-08-02 DIAGNOSIS — K5520 Angiodysplasia of colon without hemorrhage: Secondary | ICD-10-CM | POA: Diagnosis not present

## 2021-08-02 DIAGNOSIS — K2970 Gastritis, unspecified, without bleeding: Secondary | ICD-10-CM | POA: Diagnosis not present

## 2021-08-02 DIAGNOSIS — D696 Thrombocytopenia, unspecified: Secondary | ICD-10-CM

## 2021-08-02 DIAGNOSIS — F17210 Nicotine dependence, cigarettes, uncomplicated: Secondary | ICD-10-CM | POA: Diagnosis present

## 2021-08-02 DIAGNOSIS — R1013 Epigastric pain: Secondary | ICD-10-CM | POA: Diagnosis not present

## 2021-08-02 DIAGNOSIS — D649 Anemia, unspecified: Secondary | ICD-10-CM

## 2021-08-02 DIAGNOSIS — K529 Noninfective gastroenteritis and colitis, unspecified: Secondary | ICD-10-CM

## 2021-08-02 DIAGNOSIS — K860 Alcohol-induced chronic pancreatitis: Secondary | ICD-10-CM

## 2021-08-02 DIAGNOSIS — R251 Tremor, unspecified: Secondary | ICD-10-CM | POA: Diagnosis not present

## 2021-08-02 DIAGNOSIS — E876 Hypokalemia: Secondary | ICD-10-CM | POA: Diagnosis not present

## 2021-08-02 DIAGNOSIS — K254 Chronic or unspecified gastric ulcer with hemorrhage: Secondary | ICD-10-CM

## 2021-08-02 DIAGNOSIS — Z5329 Procedure and treatment not carried out because of patient's decision for other reasons: Secondary | ICD-10-CM | POA: Diagnosis present

## 2021-08-02 DIAGNOSIS — K863 Pseudocyst of pancreas: Secondary | ICD-10-CM | POA: Diagnosis not present

## 2021-08-02 DIAGNOSIS — E785 Hyperlipidemia, unspecified: Secondary | ICD-10-CM

## 2021-08-02 DIAGNOSIS — F419 Anxiety disorder, unspecified: Secondary | ICD-10-CM

## 2021-08-02 DIAGNOSIS — Z5900 Homelessness unspecified: Secondary | ICD-10-CM

## 2021-08-02 DIAGNOSIS — K760 Fatty (change of) liver, not elsewhere classified: Secondary | ICD-10-CM | POA: Diagnosis present

## 2021-08-02 DIAGNOSIS — R569 Unspecified convulsions: Secondary | ICD-10-CM

## 2021-08-02 DIAGNOSIS — Z79899 Other long term (current) drug therapy: Secondary | ICD-10-CM

## 2021-08-02 DIAGNOSIS — K8689 Other specified diseases of pancreas: Secondary | ICD-10-CM

## 2021-08-02 DIAGNOSIS — K859 Acute pancreatitis without necrosis or infection, unspecified: Secondary | ICD-10-CM | POA: Diagnosis present

## 2021-08-02 DIAGNOSIS — D500 Iron deficiency anemia secondary to blood loss (chronic): Secondary | ICD-10-CM

## 2021-08-02 DIAGNOSIS — K449 Diaphragmatic hernia without obstruction or gangrene: Secondary | ICD-10-CM | POA: Diagnosis present

## 2021-08-02 DIAGNOSIS — I10 Essential (primary) hypertension: Secondary | ICD-10-CM | POA: Diagnosis present

## 2021-08-02 DIAGNOSIS — R739 Hyperglycemia, unspecified: Secondary | ICD-10-CM

## 2021-08-02 DIAGNOSIS — B9681 Helicobacter pylori [H. pylori] as the cause of diseases classified elsewhere: Secondary | ICD-10-CM | POA: Diagnosis present

## 2021-08-02 DIAGNOSIS — K8020 Calculus of gallbladder without cholecystitis without obstruction: Secondary | ICD-10-CM | POA: Diagnosis present

## 2021-08-02 HISTORY — DX: Gastrointestinal hemorrhage, unspecified: K92.2

## 2021-08-02 LAB — URINALYSIS, ROUTINE W REFLEX MICROSCOPIC
Bilirubin Urine: NEGATIVE
Glucose, UA: NEGATIVE mg/dL
Hgb urine dipstick: NEGATIVE
Ketones, ur: NEGATIVE mg/dL
Leukocytes,Ua: NEGATIVE
Nitrite: NEGATIVE
Protein, ur: NEGATIVE mg/dL
Specific Gravity, Urine: 1.004 — ABNORMAL LOW (ref 1.005–1.030)
pH: 7 (ref 5.0–8.0)

## 2021-08-02 LAB — CBC WITH DIFFERENTIAL/PLATELET
Abs Immature Granulocytes: 0.02 10*3/uL (ref 0.00–0.07)
Basophils Absolute: 0 10*3/uL (ref 0.0–0.2)
Basophils Absolute: 0 10*3/uL (ref 0.0–0.1)
Basophils Relative: 1 %
Basos: 1 %
EOS (ABSOLUTE): 0 10*3/uL (ref 0.0–0.4)
Eos: 1 %
Eosinophils Absolute: 0 10*3/uL (ref 0.0–0.5)
Eosinophils Relative: 1 %
HCT: 23.9 % — ABNORMAL LOW (ref 39.0–52.0)
Hematocrit: 23.7 % — ABNORMAL LOW (ref 37.5–51.0)
Hemoglobin: 7.5 g/dL — ABNORMAL LOW (ref 13.0–17.7)
Hemoglobin: 7.1 g/dL — ABNORMAL LOW (ref 13.0–17.0)
Immature Granulocytes: 1 %
Lymphocytes Absolute: 1.2 10*3/uL (ref 0.7–3.1)
Lymphocytes Relative: 39 %
Lymphs Abs: 1.4 10*3/uL (ref 0.7–4.0)
Lymphs: 31 %
MCH: 23.7 pg — ABNORMAL LOW (ref 26.6–33.0)
MCH: 24.1 pg — ABNORMAL LOW (ref 26.0–34.0)
MCHC: 31.6 g/dL (ref 31.5–35.7)
MCHC: 29.7 g/dL — ABNORMAL LOW (ref 30.0–36.0)
MCV: 75 fL — ABNORMAL LOW (ref 79–97)
MCV: 81 fL (ref 80.0–100.0)
Monocytes Absolute: 0.4 10*3/uL (ref 0.1–0.9)
Monocytes Absolute: 0.4 10*3/uL (ref 0.1–1.0)
Monocytes Relative: 10 %
Monocytes: 10 %
Neutro Abs: 1.8 10*3/uL (ref 1.7–7.7)
Neutrophils Absolute: 2.2 10*3/uL (ref 1.4–7.0)
Neutrophils Relative %: 48 %
Neutrophils: 57 %
Platelets: 470 10*3/uL — ABNORMAL HIGH (ref 150–450)
Platelets: 414 10*3/uL — ABNORMAL HIGH (ref 150–400)
RBC: 3.16 x10E6/uL — ABNORMAL LOW (ref 4.14–5.80)
RBC: 2.95 MIL/uL — ABNORMAL LOW (ref 4.22–5.81)
RDW: 26 % — ABNORMAL HIGH (ref 11.6–15.4)
RDW: 26.1 % — ABNORMAL HIGH (ref 11.5–15.5)
WBC: 3.8 10*3/uL (ref 3.4–10.8)
WBC: 3.6 10*3/uL — ABNORMAL LOW (ref 4.0–10.5)
nRBC: 0 % (ref 0.0–0.2)

## 2021-08-02 LAB — COMPREHENSIVE METABOLIC PANEL
ALT: 34 IU/L (ref 0–44)
ALT: 33 U/L (ref 0–44)
AST: 130 IU/L — ABNORMAL HIGH (ref 0–40)
AST: 112 U/L — ABNORMAL HIGH (ref 15–41)
Albumin/Globulin Ratio: 0.9 — ABNORMAL LOW (ref 1.2–2.2)
Albumin: 3.8 g/dL (ref 3.8–4.9)
Albumin: 3.3 g/dL — ABNORMAL LOW (ref 3.5–5.0)
Alkaline Phosphatase: 123 IU/L — ABNORMAL HIGH (ref 44–121)
Alkaline Phosphatase: 102 U/L (ref 38–126)
Anion gap: 9 (ref 5–15)
BUN/Creatinine Ratio: 11 (ref 9–20)
BUN: 6 mg/dL (ref 6–24)
BUN: <5 mg/dL — ABNORMAL LOW (ref 6–20)
Bilirubin Total: 0.5 mg/dL (ref 0.0–1.2)
CO2: 25 mmol/L (ref 20–29)
CO2: 24 mmol/L (ref 22–32)
Calcium: 9.2 mg/dL (ref 8.7–10.2)
Calcium: 8.3 mg/dL — ABNORMAL LOW (ref 8.9–10.3)
Chloride: 102 mmol/L (ref 96–106)
Chloride: 106 mmol/L (ref 98–111)
Creatinine, Ser: 0.55 mg/dL — ABNORMAL LOW (ref 0.76–1.27)
Creatinine, Ser: 0.5 mg/dL — ABNORMAL LOW (ref 0.61–1.24)
GFR, Estimated: >60 mL/min (ref >60)
Globulin, Total: 4.2 g/dL (ref 1.5–4.5)
Glucose, Bld: 89 mg/dL (ref 70–99)
Glucose: 78 mg/dL (ref 70–99)
Potassium: 4.1 mmol/L (ref 3.5–5.2)
Potassium: 3.2 mmol/L — ABNORMAL LOW (ref 3.5–5.1)
Sodium: 139 mmol/L (ref 134–144)
Sodium: 139 mmol/L (ref 135–145)
Total Bilirubin: 0.6 mg/dL (ref 0.3–1.2)
Total Protein: 8 g/dL (ref 6.0–8.5)
Total Protein: 8.2 g/dL — ABNORMAL HIGH (ref 6.5–8.1)
eGFR: 116 mL/min/{1.73_m2} (ref >59)

## 2021-08-02 LAB — PROTIME-INR
INR: 1 (ref 0.9–1.2)
Prothrombin Time: 10.3 s (ref 9.1–12.0)

## 2021-08-02 LAB — LIPASE, BLOOD: Lipase: 557 U/L — ABNORMAL HIGH (ref 11–51)

## 2021-08-02 LAB — LIPASE: Lipase: 1201 U/L — ABNORMAL HIGH (ref 13–78)

## 2021-08-02 MED ORDER — ADULT MULTIVITAMIN W/MINERALS CH
1.0000 | ORAL_TABLET | Freq: Every day | ORAL | 0 refills | Status: DC
Start: 2021-08-02 — End: 2021-09-27
  Filled 2021-08-02: qty 30, 30d supply, fill #0

## 2021-08-02 MED ORDER — PANCRELIPASE (LIP-PROT-AMYL) 36000-114000 UNITS PO CPEP
36000.0000 [IU] | ORAL_CAPSULE | Freq: Three times a day (TID) | ORAL | 3 refills | Status: DC
Start: 2021-08-02 — End: 2021-08-08
  Filled 2021-08-02: qty 90, 30d supply, fill #0

## 2021-08-02 MED ORDER — SALINE SPRAY 0.65 % NA SOLN
2.0000 | NASAL | 2 refills | Status: DC
  Filled 2021-08-02: qty 30, 60d supply, fill #0

## 2021-08-02 MED ORDER — FOLIC ACID 1 MG PO TABS
1.0000 mg | ORAL_TABLET | Freq: Every day | ORAL | 2 refills | Status: DC
  Filled 2021-08-02: qty 30, 30d supply, fill #0

## 2021-08-02 MED ORDER — LORAZEPAM 0.5 MG PO TABS
ORAL_TABLET | ORAL | 0 refills | Status: DC
  Filled 2021-08-02: qty 40, 4d supply, fill #0

## 2021-08-02 MED ORDER — PANTOPRAZOLE SODIUM 40 MG PO TBEC
40.0000 mg | DELAYED_RELEASE_TABLET | Freq: Two times a day (BID) | ORAL | 0 refills | Status: DC
  Filled 2021-08-02: qty 30, 30d supply, fill #0

## 2021-08-02 MED ORDER — SUCRALFATE 1 G PO TABS
1.0000 g | ORAL_TABLET | Freq: Three times a day (TID) | ORAL | 0 refills | Status: DC
  Filled 2021-08-02: qty 120, 30d supply, fill #0

## 2021-08-02 MED ORDER — THIAMINE HCL 100 MG PO TABS
100.0000 mg | ORAL_TABLET | Freq: Every day | ORAL | 0 refills | Status: DC
Start: 2021-08-02 — End: 2021-08-08
  Filled 2021-08-02: qty 100, 100d supply, fill #0

## 2021-08-02 NOTE — Progress Notes (Signed)
"  Needs medication that deters you from using alcohol"  Abdominal pain, hematemesis this a.m.

## 2021-08-02 NOTE — Assessment & Plan Note (Signed)
Patient's had colitis in the past would benefit from reimaging

## 2021-08-02 NOTE — Assessment & Plan Note (Signed)
Patient on alcohol withdrawal in July 2022 likely high risk for this to occur again therefore needs admission

## 2021-08-02 NOTE — ED Provider Notes (Signed)
Samuel Blevins   CSN: [id number] Arrival date & time: 08/02/21  1527     History  Chief Complaint  Patient presents with   Abdominal Pain    Samuel Blevins is a 56 y.o. male.  The history is provided by the patient.  Abdominal Pain Pain location:  Epigastric Pain quality: aching   Pain radiates to:  Does not radiate Pain severity:  Mild Onset quality:  Gradual Duration:  1 day Progression:  Resolved Chronicity:  Recurrent Context: alcohol use   Relieved by:  Nothing Worsened by:  Nothing Associated symptoms: melena (?)   Associated symptoms: no anorexia, no belching, no chest pain, no chills, no constipation, no cough, no diarrhea, no dysuria, no fatigue, no fever, no hematemesis, no hematochezia, no hematuria, no nausea, no shortness of breath, no sore throat and no vomiting   Risk factors: alcohol abuse        Home Medications Prior to Admission medications   Medication Sig Start Date End Date Taking? Authorizing Provider  acetaminophen (TYLENOL) 325 MG tablet Take 2 tablets (650 mg total) by mouth every 6 (six) hours as needed for fever, moderate pain, mild pain or headache. Patient not taking: Reported on 05/06/2021 12/12/20   Riesa Pope, MD  folic acid (FOLVITE) 1 MG tablet Take 1 tablet (1 mg total) by mouth daily. 08/02/21   [name], MD  lipase/protease/amylase (CREON) 36000 UNITS CPEP capsule Take 1 capsule (36,000 Units total) by mouth 3 (three) times daily before meals. 08/02/21   [name], MD  LORazepam (ATIVAN) 0.5 MG tablet Take 4 tablets by mouth 4 times daily for 1 day, THEN 3 tablets 4 times daily for 1 day, THEN 2 tablets 4  times daily for 1 day, THEN 1 tablet 4  times daily for 1 day. 08/02/21 08/06/21  [name], MD  Multiple Vitamin (MULTIVITAMIN WITH MINERALS) TABS tablet Take 1 tablet by mouth daily. 08/02/21   [name], MD  pantoprazole (PROTONIX) 40 MG tablet  Take 1 tablet (40 mg total) by mouth 2 (two) times daily before a meal. 08/02/21   [name], MD  sodium chloride (OCEAN) 0.65 % SOLN nasal spray Place 2 sprays into both nostrils every 4 (four) hours while awake. 08/02/21   [name], MD  sucralfate (CARAFATE) 1 g tablet Take 1 tablet (1 g total) by mouth 4 (four) times daily -  with meals and at bedtime. 08/02/21 09/01/21  [name], MD  thiamine 100 MG tablet Take 1 tablet (100 mg total) by mouth daily. 08/02/21   [name], MD      Allergies    Patient has no known allergies.    Review of Systems   Review of Systems  Constitutional:  Negative for chills, fatigue and fever.  HENT:  Negative for sore throat.   Respiratory:  Negative for cough and shortness of breath.   Cardiovascular:  Negative for chest pain.  Gastrointestinal:  Positive for abdominal pain and melena (?). Negative for anorexia, constipation, diarrhea, hematemesis, hematochezia, nausea and vomiting.  Genitourinary:  Negative for dysuria and hematuria.    Physical Exam Updated Vital Signs BP 115/80   Pulse 93   Temp 98.5 F (36.9 C) (Oral)   Resp 19   SpO2 96%  Physical Exam Vitals and nursing Blevins reviewed.  Constitutional:      General: He is not in acute distress.    Appearance: He  is well-developed. He is not ill-appearing.  HENT:     Head: Normocephalic and atraumatic.     Mouth/Throat:     Mouth: Mucous membranes are moist.  Eyes:     Extraocular Movements: Extraocular movements intact.     Conjunctiva/sclera: Conjunctivae normal.  Cardiovascular:     Rate and Rhythm: Normal rate and regular rhythm.     Heart sounds: Normal heart sounds. No murmur heard. Pulmonary:     Effort: Pulmonary effort is normal. No respiratory distress.     Breath sounds: Normal breath sounds.  Abdominal:     General: Abdomen is flat.     Palpations: Abdomen is soft.     Tenderness: There is no abdominal tenderness.  Musculoskeletal:         General: No swelling.     Cervical back: Neck supple.  Skin:    General: Skin is warm and dry.     Capillary Refill: Capillary refill takes less than 2 seconds.  Neurological:     Mental Status: He is alert.  Psychiatric:        Mood and Affect: Mood normal.     ED Results / Procedures / Treatments   Labs (all labs ordered are listed, but only abnormal results are displayed) Labs Reviewed  CBC WITH DIFFERENTIAL/PLATELET - Abnormal; Notable for the following components:      Result Value   WBC 3.6 (*)    RBC 2.95 (*)    Hemoglobin 7.1 (*)    HCT 23.9 (*)    MCH 24.1 (*)    MCHC 29.7 (*)    RDW 26.1 (*)    Platelets 414 (*)    All other components within normal limits  COMPREHENSIVE METABOLIC PANEL - Abnormal; Notable for the following components:   Potassium 3.2 (*)    BUN <5 (*)    Creatinine, Ser 0.50 (*)    Calcium 8.3 (*)    Total Protein 8.2 (*)    Albumin 3.3 (*)    AST 112 (*)    All other components within normal limits  LIPASE, BLOOD - Abnormal; Notable for the following components:   Lipase 557 (*)    All other components within normal limits  URINALYSIS, ROUTINE W REFLEX MICROSCOPIC - Abnormal; Notable for the following components:   Specific Gravity, Urine 1.004 (*)    All other components within normal limits    EKG EKG Interpretation  Date/Time:  Thursday August 02 2021 15:49:12 EDT Ventricular Rate:  98 PR Interval:  140 QRS Duration: 94 QT Interval:  364 QTC Calculation: 464 R Axis:   86 Text Interpretation: Normal sinus rhythm Normal ECG When compared with ECG of 05-Jan-2021 14:15, PREVIOUS ECG IS PRESENT Confirmed by Lennice Sites ([id number]) on 08/02/2021 6:27:28 PM  Radiology No results found.  Procedures Procedures    Medications Ordered in ED Medications - No data to display  ED Course/ Medical Decision Making/ A&P                           Medical Decision Making  Samuel Blevins is here with abdominal pain.  Normal vitals.  No fever.   Upon my evaluation patient wants to leave.  Sounds like he was diagnosed with acute pancreatitis likely in setting of alcohol use.  Was seen at internal medicine clinic and had blood work done.  He had a lipase of 1200.  He had labs repeated here while he was waiting in  triage with a CBC, CMP, lipase.  Hemoglobin was stable at 7.1 per my review and interpretation.  Lipase has improved to 557.  No other significant electrolyte abnormality or kidney injury or leukocytosis.  Question if he was having melena.  Refuse rectal exam, IV, CT scan, admission, hydration.  Overall when I got into the room patient asked for food and left.  Patient had normal vitals.  I offered him admission and further work-up but he eloped/left AGAINST MEDICAL ADVICE.  This chart was dictated using voice recognition software.  Despite best efforts to proofread,  errors can occur which can change the documentation meaning.         Final Clinical Impression(s) / ED Diagnoses Final diagnoses:  Alcohol-induced acute pancreatitis, unspecified complication status    Rx / DC Orders ED Discharge Orders     None         Lennice Sites, DO 08/02/21 2204

## 2021-08-02 NOTE — Assessment & Plan Note (Signed)
Needs serial hemoglobins

## 2021-08-02 NOTE — ED Triage Notes (Signed)
Sent for vomiting blood and dark stool.  Also having abd pain and hx of pancreatitis. Patient admits to being a alcoholic but has cut down drinking to a pint.

## 2021-08-02 NOTE — ED Notes (Signed)
NA for bed assignment.  

## 2021-08-02 NOTE — ED Notes (Signed)
Pt sts hes done playing games and is leaving. Pt alert, ad ambulatory with walker. Refuses to sign AMA paperwork.

## 2021-08-02 NOTE — ED Notes (Signed)
Pt placed in hospital gown. Refusing IV placement. Demanding food and stating he wants to leave.

## 2021-08-02 NOTE — Patient Instructions (Signed)
Go to pharmacy downstairs pick up all your medications after discharge  Labs are being drawn and will be run emergently and we will let you know results Dr. [name] will come to the shelter this afternoon to talk with you  Dr. [name] is bringing the alcohol withdrawal medication with me to the shelter this afternoon  Based on the labs you may need to go to the hospital we will inform you of this and provide you transportation if that is the case  Avoid all alcohol while on the lorazepam that prevents you from going into withdrawals  We will let you know all the results as soon as they become available

## 2021-08-02 NOTE — Assessment & Plan Note (Signed)
I did draw iron levels these are pending

## 2021-08-02 NOTE — ED Provider Triage Note (Signed)
Emergency Medicine Provider Triage Evaluation Note  Samuel Blevins , a 56 y.o. male  was evaluated in triage.  Pt complains of epigastric abdominal pain.  Also having hematemesis x7 months of black and tarry stool.  Seen by Dr. Earlier today sent to ED for further evaluation and CT scan.  Concern for complications of pancreatitis given history of pseudocysts and possible GI bleed..  Review of Systems  Per HPI Physical Exam  BP 134/81 (BP Location: Right Arm)   Pulse 95   Temp 99.2 F (37.3 C) (Oral)   Resp 16   SpO2 96%  Gen:   Awake, no distress   Resp:  Normal effort  MSK:   Moves extremities without difficulty  Other:  Abdomen tender epigastric area with guarding  Medical Decision Making  Medically screening exam initiated at 3:43 PM.  Appropriate orders placed.  Samuel Blevins was informed that the remainder of the evaluation will be completed by another provider, this initial triage assessment does not replace that evaluation, and the importance of remaining in the ED until their evaluation is complete.     Theron Arista, PA-C 08/02/21 1544

## 2021-08-02 NOTE — Assessment & Plan Note (Signed)
As per gastritis

## 2021-08-02 NOTE — Assessment & Plan Note (Signed)
Continues with GI bleeding although hemoglobin is not substantially changed platelet count is actually increased  Would benefit from upper endoscopy during this admission patient agrees to receive this

## 2021-08-02 NOTE — Assessment & Plan Note (Signed)
Patient had consistent diarrhea he may have an element of pancreatic insufficiency and was on Creon previously he had does have a fresh prescription in his possession

## 2021-08-02 NOTE — Assessment & Plan Note (Signed)
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

## 2021-08-02 NOTE — Assessment & Plan Note (Signed)
Remarkably liver functions not terribly elevated

## 2021-08-02 NOTE — ED Notes (Signed)
No answer in waiting room 

## 2021-08-02 NOTE — Assessment & Plan Note (Signed)
History of pseudocyst this needs to be evaluated as he is incessantly vomiting

## 2021-08-02 NOTE — Progress Notes (Signed)
Established Patient Office Visit  Subjective   Patient ID: Samuel Blevins, male    DOB: 01/18/1966  Age: 56 y.o. MRN: [id number]  Nausea vomiting  This is a 56 year old male who has had previous history of pancreatic pseudocyst pancreatitis and severe alcoholism.  He has been drinking a pint of vodka daily.  He is at the [employer]  We brought him into the health and wellness office today to get labs and further examine him.  He had abdominal pain in the mid epigastric area that is worsening.  He has been vomiting coffee-ground material with occasional small amounts of blood.  He has had some dark melanotic stools.  The patient has been hospitalized in March of this year but never really had gastroenterology follow-up.  I tried to engage him at the shelter at least twice in the last 2 months he finally engaged with me and came to the office today for examination and emergency labs.  In the process of the stat labs being drawn we had his medications refilled that he had not been taking.  The labs came back positive for a lipase of greater than 1200 and have been in the low-grade 100 range.  Hemoglobin is about the same 7-1/2 platelet count is actually up his liver function actually is remarkably not any worse.  Because he has had a pancreatic pseudocyst I believe he needs CT imaging urgently and we cannot accomplish this as an outpatient particular given the fact he lives in a homeless shelter  We contacted the emergency room triage nurse at [facility] and told him we will be sending him over and actually he is going over to the emergency room from the homeless shelter.  He is stable at this time is not hemodynamically unstable and is not septic.  It would be best if he could have gastroenterology see him during this admission and perform an upper endoscopy as well.  Note the patient needs a rollator to walk with his alcoholism he has had significant alcoholic  neuropathy.    Patient Active Problem List   Diagnosis Date Noted   Alcohol-induced chronic pancreatitis ([place]) 08/02/2021   Anxiety and depression 07/04/2021   Homeless 07/04/2021   Osteoarthritis 07/04/2021   Leukopenia 05/09/2021   GI bleeding 05/08/2021   Pancreatic insufficiency 05/08/2021   Colitis, acute 05/08/2021   Acute bronchitis 05/08/2021   Chronic alcohol abuse 05/07/2021   Pancreatitis 40/11/2723   Alcoholic steatohepatitis 36/64/4034   Iron deficiency anemia due to chronic blood loss 12/07/2020   Seizure ([place]) 12/04/2020   Gastrointestinal hemorrhage    Acute pancreatitis 09/16/2020   Alcohol withdrawal ([place]) 09/16/2020   Chronic pain syndrome 12/28/2019   Pancreatic pseudocyst 10/28/2019   Pancreatitis, acute 10/28/2019   Gastritis and gastroduodenitis    AVM (arteriovenous malformation) of small bowel, acquired    Alcohol dependence syndrome ([place]) 06/21/2019   Acute on chronic anemia 11/23/2015   Tobacco abuse 03/17/2014   Essential hypertension 03/17/2014   Past Medical History:  Diagnosis Date   Alcohol withdrawal syndrome with complication ([place])    Alcoholism ([place])    Elevated AST (SGOT)    Homeless    Hypertension    Pancreatic insufficiency    takes Creon   Symptomatic anemia 11/23/2015   Thrombocytopenia ([place]) 05/09/2021   Past Surgical History:  Procedure Laterality Date   BIOPSY  06/23/2019   Procedure: BIOPSY;  Surgeon: Lavena Bullion, DO;  Location: [facility];  Service: Gastroenterology;;  BIOPSY  12/12/2020   Procedure: BIOPSY;  Surgeon: Jackquline Denmark, MD;  Location: [facility] ENDOSCOPY;  Service: Endoscopy;;   ESOPHAGOGASTRODUODENOSCOPY (EGD) WITH PROPOFOL N/A 06/23/2019   Procedure: ESOPHAGOGASTRODUODENOSCOPY (EGD) WITH PROPOFOL;  Surgeon: Lavena Bullion, DO;  Location: [place];  Service: Gastroenterology;  Laterality: N/A;   ESOPHAGOGASTRODUODENOSCOPY (EGD) WITH PROPOFOL N/A 12/12/2020   Procedure: ESOPHAGOGASTRODUODENOSCOPY  (EGD) WITH PROPOFOL;  Surgeon: Jackquline Denmark, MD;  Location: [facility] ENDOSCOPY;  Service: Endoscopy;  Laterality: N/A;   HOT HEMOSTASIS N/A 06/23/2019   Procedure: HOT HEMOSTASIS (ARGON PLASMA COAGULATION/BICAP);  Surgeon: Lavena Bullion, DO;  Location: [facility] ENDOSCOPY;  Service: Gastroenterology;  Laterality: N/A;   Social History   Tobacco Use   Smoking status: Every Day    Packs/day: 1.00    Years: 30.00    Total pack years: 30.00    Types: Cigarettes   Smokeless tobacco: Never  Vaping Use   Vaping Use: Never used  Substance Use Topics   Alcohol use: Yes   Drug use: Never   Social History   Socioeconomic History   Marital status: Single    Spouse name: Not on file   Number of children: Not on file   Years of education: Not on file   Highest education level: Not on file  Occupational History   Not on file  Tobacco Use   Smoking status: Every Day    Packs/day: 1.00    Years: 30.00    Total pack years: 30.00    Types: Cigarettes   Smokeless tobacco: Never  Vaping Use   Vaping Use: Never used  Substance and Sexual Activity   Alcohol use: Yes   Drug use: Never   Sexual activity: Not Currently  Other Topics Concern   Not on file  Social History Narrative   Not on file   Social Determinants of Health   Financial Resource Strain: Not on file  Food Insecurity: Not on file  Transportation Needs: Not on file  Physical Activity: Not on file  Stress: Not on file  Social Connections: Not on file  Intimate Partner Violence: Not on file   Family Status  Relation Name Status   Neg Hx  (Not Specified)   Family History  Problem Relation Age of Onset   Diabetes Mellitus II Neg Hx    Colon cancer Neg Hx    Stomach cancer Neg Hx    Pancreatic cancer Neg Hx    No Known Allergies    Review of Systems  Constitutional:  Positive for malaise/fatigue and weight loss. Negative for chills, diaphoresis and fever.  HENT:  Positive for nosebleeds. Negative for congestion, ear  discharge, ear pain, hearing loss, sore throat and tinnitus.   Eyes:  Negative for blurred vision, double vision, photophobia and discharge.  Respiratory:  Negative for cough, hemoptysis, sputum production, shortness of breath, wheezing and stridor.        No excess mucus  Cardiovascular:  Positive for leg swelling. Negative for chest pain, palpitations, orthopnea, claudication and PND.  Gastrointestinal:  Positive for abdominal pain, blood in stool, diarrhea, heartburn, melena, nausea and vomiting. Negative for constipation.  Genitourinary:  Negative for dysuria, flank pain, frequency, hematuria and urgency.  Musculoskeletal:  Negative for back pain, falls, joint pain, myalgias and neck pain.       Gait disturbance  Skin:  Negative for itching and rash.  Neurological:  Positive for dizziness, weakness and headaches. Negative for tingling, tremors, sensory change, speech change, focal weakness, seizures and loss of  consciousness.  Endo/Heme/Allergies:  Negative for environmental allergies and polydipsia. Does not bruise/bleed easily.  Psychiatric/Behavioral:  Positive for depression and memory loss. Negative for hallucinations, substance abuse and suicidal ideas. The patient is nervous/anxious. The patient does not have insomnia.   All other systems reviewed and are negative.     Objective:     BP (!) 147/71   Pulse 95   Temp 98.3 F (36.8 C)   Resp 16   Wt 175 lb (79.4 kg)   SpO2 98%   BMI 26.61 kg/m  BP Readings from Last 3 Encounters:  08/02/21 (!) 147/71  08/01/21 132/82  07/04/21 (!) 145/82   Wt Readings from Last 3 Encounters:  08/02/21 175 lb (79.4 kg)  07/12/21 173 lb (78.5 kg)  05/05/21 166 lb (75.3 kg)      Physical Exam Vitals reviewed.  Constitutional:      Appearance: Normal appearance. He is well-developed. He is ill-appearing. He is not diaphoretic.  HENT:     Head: Normocephalic and atraumatic.     Nose: Nose normal. No nasal deformity, septal deviation,  mucosal edema, congestion or rhinorrhea.     Right Sinus: No maxillary sinus tenderness or frontal sinus tenderness.     Left Sinus: No maxillary sinus tenderness or frontal sinus tenderness.     Mouth/Throat:     Mouth: Mucous membranes are dry.     Pharynx: Oropharynx is clear. No oropharyngeal exudate.  Eyes:     General: No scleral icterus.    Conjunctiva/sclera: Conjunctivae normal.     Pupils: Pupils are equal, round, and reactive to light.  Neck:     Thyroid: No thyromegaly.     Vascular: No carotid bruit or JVD.     Trachea: Trachea normal. No tracheal tenderness or tracheal deviation.  Cardiovascular:     Rate and Rhythm: Normal rate and regular rhythm.     Chest Wall: PMI is not displaced.     Pulses: Normal pulses. No decreased pulses.     Heart sounds: Normal heart sounds, S1 normal and S2 normal. Heart sounds not distant. No murmur heard.    No systolic murmur is present.     No diastolic murmur is present.     No friction rub. No gallop. No S3 or S4 sounds.  Pulmonary:     Effort: No tachypnea, accessory muscle usage or respiratory distress.     Breath sounds: No stridor. No decreased breath sounds, wheezing, rhonchi or rales.  Chest:     Chest wall: No tenderness.  Abdominal:     General: Bowel sounds are normal. There is no distension.     Palpations: Abdomen is not rigid. There is no mass.     Tenderness: There is abdominal tenderness. There is guarding. There is no right CVA tenderness, left CVA tenderness or rebound.     Hernia: No hernia is present.  Musculoskeletal:        General: Normal range of motion.     Cervical back: Normal range of motion and neck supple. No edema, erythema or rigidity. No muscular tenderness. Normal range of motion.  Lymphadenopathy:     Head:     Right side of head: No submental or submandibular adenopathy.     Left side of head: No submental or submandibular adenopathy.     Cervical: No cervical adenopathy.  Skin:    General:  Skin is warm and dry.     Coloration: Skin is not pale.  Findings: No rash.     Nails: There is no clubbing.  Neurological:     General: No focal deficit present.     Mental Status: He is alert and oriented to person, place, and time.     Sensory: No sensory deficit.     Motor: Weakness present.     Gait: Gait abnormal.     Deep Tendon Reflexes: Reflexes abnormal.  Psychiatric:        Attention and Perception: Attention and perception normal.        Mood and Affect: Mood is depressed. Mood is not anxious. Affect is tearful.        Speech: Speech normal.        Behavior: Behavior normal. Behavior is cooperative.        Thought Content: Thought content does not include homicidal or suicidal ideation. Thought content does not include homicidal or suicidal plan.        Cognition and Memory: Cognition is impaired. Memory is impaired. He exhibits impaired recent memory and impaired remote memory.        Judgment: Judgment is impulsive.      Results for orders placed or performed in visit on 08/02/21  Comprehensive metabolic panel  Result Value Ref Range   Glucose 78 70 - 99 mg/dL   BUN 6 6 - 24 mg/dL   Creatinine, Ser 0.55 (L) 0.76 - 1.27 mg/dL   eGFR 116 >59 mL/min/1.73   BUN/Creatinine Ratio 11 9 - 20   Sodium 139 134 - 144 mmol/L   Potassium 4.1 3.5 - 5.2 mmol/L   Chloride 102 96 - 106 mmol/L   CO2 25 20 - 29 mmol/L   Calcium 9.2 8.7 - 10.2 mg/dL   Total Protein 8.0 6.0 - 8.5 g/dL   Albumin 3.8 3.8 - 4.9 g/dL   Globulin, Total 4.2 1.5 - 4.5 g/dL   Albumin/Globulin Ratio 0.9 (L) 1.2 - 2.2   Bilirubin Total 0.5 0.0 - 1.2 mg/dL   Alkaline Phosphatase 123 (H) 44 - 121 IU/L   AST 130 (H) 0 - 40 IU/L   ALT 34 0 - 44 IU/L  CBC with Differential/Platelet  Result Value Ref Range   WBC 3.8 3.4 - 10.8 x10E3/uL   RBC 3.16 (L) 4.14 - 5.80 x10E6/uL   Hemoglobin 7.5 (L) 13.0 - 17.7 g/dL   Hematocrit 23.7 (L) 37.5 - 51.0 %   MCV 75 (L) 79 - 97 fL   MCH 23.7 (L) 26.6 - 33.0 pg    MCHC 31.6 31.5 - 35.7 g/dL   RDW 26.0 (H) 11.6 - 15.4 %   Platelets 470 (H) 150 - 450 x10E3/uL   Neutrophils 57 Not Estab. %   Lymphs 31 Not Estab. %   Monocytes 10 Not Estab. %   Eos 1 Not Estab. %   Basos 1 Not Estab. %   Neutrophils Absolute 2.2 1.4 - 7.0 x10E3/uL   Lymphocytes Absolute 1.2 0.7 - 3.1 x10E3/uL   Monocytes Absolute 0.4 0.1 - 0.9 x10E3/uL   EOS (ABSOLUTE) 0.0 0.0 - 0.4 x10E3/uL   Basophils Absolute 0.0 0.0 - 0.2 x10E3/uL  Lipase  Result Value Ref Range   Lipase 1,201 (H) 13 - 78 U/L  Protime-INR  Result Value Ref Range   INR WILL FOLLOW    Prothrombin Time WILL FOLLOW     Last CBC Lab Results  Component Value Date   WBC 3.8 08/02/2021   HGB 7.5 (L) 08/02/2021   HCT 23.7 (L) 08/02/2021  MCV 75 (L) 08/02/2021   MCH 23.7 (L) 08/02/2021   RDW 26.0 (H) 08/02/2021   PLT 470 (H) 07/37/1062   Last metabolic panel Lab Results  Component Value Date   GLUCOSE 78 08/02/2021   NA 139 08/02/2021   K 4.1 08/02/2021   CL 102 08/02/2021   CO2 25 08/02/2021   BUN 6 08/02/2021   CREATININE 0.55 (L) 08/02/2021   EGFR 116 08/02/2021   CALCIUM 9.2 08/02/2021   PHOS 4.4 05/12/2021   PROT 8.0 08/02/2021   ALBUMIN 3.8 08/02/2021   LABGLOB 4.2 08/02/2021   AGRATIO 0.9 (L) 08/02/2021   BILITOT 0.5 08/02/2021   ALKPHOS 123 (H) 08/02/2021   AST 130 (H) 08/02/2021   ALT 34 08/02/2021   ANIONGAP 10 05/13/2021   Last lipids No results found for: "CHOL", "HDL", "LDLCALC", "LDLDIRECT", "TRIG", "CHOLHDL" Last hemoglobin A1c No results found for: "HGBA1C" Last thyroid functions Lab Results  Component Value Date   TSH 2.216 11/23/2015   Last vitamin D No results found for: "25OHVITD2", "25OHVITD3", "VD25OH" Last vitamin B12 and Folate Lab Results  Component Value Date   VITAMINB12 443 12/04/2020   FOLATE 28.8 05/07/2021      The ASCVD Risk score (Arnett DK, et al., 2019) failed to calculate for the following reasons:   Cannot find a previous HDL lab   Cannot  find a previous total cholesterol lab    Assessment & Plan:   Problem List Items Addressed This Visit       Digestive   Gastritis and gastroduodenitis    As per gastritis      Relevant Orders   Comprehensive metabolic panel (Completed)   CBC with Differential/Platelet (Completed)   Lipase (Completed)   Iron, TIBC and Ferritin Panel   Protime-INR (Completed)   Pancreatic pseudocyst    History of pseudocyst this needs to be evaluated as he is incessantly vomiting      Relevant Orders   Comprehensive metabolic panel (Completed)   CBC with Differential/Platelet (Completed)   Lipase (Completed)   Iron, TIBC and Ferritin Panel   Protime-INR (Completed)   Pancreatitis, acute - Primary    Recurrent acute pancreatitis with lipase elevated at 1200 Patient's had pancreatic pseudocyst in the past this may be recurring  He needs imaging to rule out necrotizing pancreatitis and infection  Also his GI bleeding needs to be reendoscoped and evaluated by gastroenterology  Patient is somewhat volume depleted  Remarkably liver function not abnormal and bilirubin is normal  Patient is significantly anemic with bleeding but has not substantially changed his hemoglobin in 3 months  Patient being transferred to the emergency room for evaluation emergency room aware of his impending arrival      Relevant Medications   lipase/protease/amylase (CREON) 36000 UNITS CPEP capsule   pantoprazole (PROTONIX) 40 MG tablet   sucralfate (CARAFATE) 1 g tablet   Other Relevant Orders   Comprehensive metabolic panel (Completed)   CBC with Differential/Platelet (Completed)   Lipase (Completed)   Iron, TIBC and Ferritin Panel   Protime-INR (Completed)   Acute pancreatitis   Relevant Medications   lipase/protease/amylase (CREON) 36000 UNITS CPEP capsule   pantoprazole (PROTONIX) 40 MG tablet   sucralfate (CARAFATE) 1 g tablet   Other Relevant Orders   Comprehensive metabolic panel (Completed)    CBC with Differential/Platelet (Completed)   Lipase (Completed)   Iron, TIBC and Ferritin Panel   Protime-INR (Completed)   Gastrointestinal hemorrhage    Continues with GI bleeding although hemoglobin is  not substantially changed platelet count is actually increased  Would benefit from upper endoscopy during this admission patient agrees to receive this      Relevant Orders   Comprehensive metabolic panel (Completed)   CBC with Differential/Platelet (Completed)   Lipase (Completed)   Iron, TIBC and Ferritin Panel   Protime-INR (Completed)   Alcoholic steatohepatitis    Remarkably liver functions not terribly elevated      Pancreatic insufficiency    Patient had consistent diarrhea he may have an element of pancreatic insufficiency and was on Creon previously he had does have a fresh prescription in his possession      Colitis, acute    Patient's had colitis in the past would benefit from reimaging      Alcohol-induced chronic pancreatitis ([place])    As per pancreatitis evaluation      Relevant Medications   lipase/protease/amylase (CREON) 36000 UNITS CPEP capsule   pantoprazole (PROTONIX) 40 MG tablet   sucralfate (CARAFATE) 1 g tablet     Hematopoietic and Hemostatic   RESOLVED: Thrombocytopenia ([place])    Patient had thrombocytopenia in March during that admission this is actually resolved he has a platelet count of 470,000        Other   Iron deficiency anemia due to chronic blood loss (Chronic)    I did draw iron levels these are pending      Relevant Medications   folic acid (FOLVITE) 1 MG tablet   Acute on chronic anemia    Needs serial hemoglobins      Relevant Medications   folic acid (FOLVITE) 1 MG tablet   Alcohol withdrawal ([facility])    Patient on alcohol withdrawal in July 2022 likely high risk for this to occur again therefore needs admission      Seizure ([place])    No recent seizure disorder  Patient is a high risk for alcohol withdrawal  He does  have a full prescription of Ativan for detox however he will hold this for now and will be observed in the hospital for any potential intravenous interventions and have a CIWA protocol      Chronic alcohol abuse    Once we stabilize this patient medically and get him detox I would like to refer him to a facility such as [facility] for alcohol rehab I believe he will engage around this      Anxiety and depression    This is a major driver to the patient's alcohol abuse  The patient's father was a moonshine distill her in [place] and the patient's been drinking since age 49      Relevant Medications   LORazepam (ATIVAN) 0.5 MG tablet   Other Visit Diagnoses     Hyperlipidemia, unspecified hyperlipidemia type       Relevant Orders   Lipid panel   Hyperglycemia       Relevant Orders   Hemoglobin A1c       We will follow-up this patient post hospital 45 minutes spent performing history and physical assessing patient high degree of complexity eventually had to triage the patient to the hospital and stat labs returned a few hours after encounter patient directed to the ER now   Asencion Noble, MD

## 2021-08-02 NOTE — Assessment & Plan Note (Signed)
This is a major driver to the patient's alcohol abuse  The patient's father was a moonshine distill her in [place] and the patient's been drinking since age 56

## 2021-08-02 NOTE — Assessment & Plan Note (Signed)
Patient had thrombocytopenia in March during that admission this is actually resolved he has a platelet count of 470,000

## 2021-08-02 NOTE — Congregational Nurse Program (Signed)
Rec'd call from dr Joya Gaskins, pt will need to be transported by [facility] to [facility] ED for further evaluation of alcohol r/t illness. Spoke to pt reassured him and he is agreeable to be transported to [facility].

## 2021-08-02 NOTE — Assessment & Plan Note (Signed)
No recent seizure disorder  Patient is a high risk for alcohol withdrawal  He does have a full prescription of Ativan for detox however he will hold this for now and will be observed in the hospital for any potential intravenous interventions and have a CIWA protocol

## 2021-08-02 NOTE — ED Triage Notes (Signed)
Pt BIB EMS with c/o abdominal pain. Pt seen earlier and offered admission but left AMA, pt returns tonight states symptoms are the same, denies worsening or change in symptoms. Diagnosed with alcohol induced pancreatitis.

## 2021-08-02 NOTE — Assessment & Plan Note (Signed)
Once we stabilize this patient medically and get him detox I would like to refer him to a facility such as [facility] for alcohol rehab I believe he will engage around this

## 2021-08-02 NOTE — Assessment & Plan Note (Signed)
As per pancreatitis evaluation

## 2021-08-03 ENCOUNTER — Emergency Department (HOSPITAL_COMMUNITY): Payer: 59

## 2021-08-03 ENCOUNTER — Encounter (HOSPITAL_COMMUNITY): Payer: Self-pay

## 2021-08-03 DIAGNOSIS — G894 Chronic pain syndrome: Secondary | ICD-10-CM | POA: Diagnosis present

## 2021-08-03 DIAGNOSIS — F418 Other specified anxiety disorders: Secondary | ICD-10-CM | POA: Diagnosis not present

## 2021-08-03 DIAGNOSIS — K449 Diaphragmatic hernia without obstruction or gangrene: Secondary | ICD-10-CM | POA: Diagnosis present

## 2021-08-03 DIAGNOSIS — Z5901 Sheltered homelessness: Secondary | ICD-10-CM | POA: Diagnosis not present

## 2021-08-03 DIAGNOSIS — I81 Portal vein thrombosis: Principal | ICD-10-CM

## 2021-08-03 DIAGNOSIS — K5520 Angiodysplasia of colon without hemorrhage: Secondary | ICD-10-CM

## 2021-08-03 DIAGNOSIS — D649 Anemia, unspecified: Secondary | ICD-10-CM | POA: Diagnosis not present

## 2021-08-03 DIAGNOSIS — Z79899 Other long term (current) drug therapy: Secondary | ICD-10-CM | POA: Diagnosis not present

## 2021-08-03 DIAGNOSIS — K859 Acute pancreatitis without necrosis or infection, unspecified: Secondary | ICD-10-CM | POA: Diagnosis not present

## 2021-08-03 DIAGNOSIS — B9681 Helicobacter pylori [H. pylori] as the cause of diseases classified elsewhere: Secondary | ICD-10-CM | POA: Diagnosis present

## 2021-08-03 DIAGNOSIS — K852 Alcohol induced acute pancreatitis without necrosis or infection: Secondary | ICD-10-CM | POA: Diagnosis present

## 2021-08-03 DIAGNOSIS — F1020 Alcohol dependence, uncomplicated: Secondary | ICD-10-CM | POA: Diagnosis present

## 2021-08-03 DIAGNOSIS — K760 Fatty (change of) liver, not elsewhere classified: Secondary | ICD-10-CM | POA: Diagnosis present

## 2021-08-03 DIAGNOSIS — R251 Tremor, unspecified: Secondary | ICD-10-CM | POA: Diagnosis not present

## 2021-08-03 DIAGNOSIS — Z5900 Homelessness unspecified: Secondary | ICD-10-CM

## 2021-08-03 DIAGNOSIS — F17210 Nicotine dependence, cigarettes, uncomplicated: Secondary | ICD-10-CM | POA: Diagnosis present

## 2021-08-03 DIAGNOSIS — Z5329 Procedure and treatment not carried out because of patient's decision for other reasons: Secondary | ICD-10-CM | POA: Diagnosis present

## 2021-08-03 DIAGNOSIS — K920 Hematemesis: Secondary | ICD-10-CM | POA: Diagnosis not present

## 2021-08-03 DIAGNOSIS — I10 Essential (primary) hypertension: Secondary | ICD-10-CM | POA: Diagnosis present

## 2021-08-03 DIAGNOSIS — K2970 Gastritis, unspecified, without bleeding: Secondary | ICD-10-CM | POA: Diagnosis present

## 2021-08-03 DIAGNOSIS — F419 Anxiety disorder, unspecified: Secondary | ICD-10-CM | POA: Diagnosis present

## 2021-08-03 DIAGNOSIS — K317 Polyp of stomach and duodenum: Secondary | ICD-10-CM | POA: Diagnosis not present

## 2021-08-03 DIAGNOSIS — K8020 Calculus of gallbladder without cholecystitis without obstruction: Secondary | ICD-10-CM | POA: Diagnosis present

## 2021-08-03 DIAGNOSIS — D539 Nutritional anemia, unspecified: Secondary | ICD-10-CM | POA: Diagnosis present

## 2021-08-03 DIAGNOSIS — E876 Hypokalemia: Secondary | ICD-10-CM | POA: Diagnosis not present

## 2021-08-03 DIAGNOSIS — R1013 Epigastric pain: Secondary | ICD-10-CM | POA: Diagnosis present

## 2021-08-03 LAB — LIPID PANEL
Chol/HDL Ratio: 2.2 ratio (ref 0.0–5.0)
Cholesterol, Total: 158 mg/dL (ref 100–199)
HDL: 71 mg/dL (ref >39)
LDL Chol Calc (NIH): 73 mg/dL (ref 0–99)
Triglycerides: 73 mg/dL (ref 0–149)
VLDL Cholesterol Cal: 14 mg/dL (ref 5–40)

## 2021-08-03 LAB — COMPREHENSIVE METABOLIC PANEL
ALT: 34 U/L (ref 0–44)
ALT: 32 U/L (ref 0–44)
AST: 110 U/L — ABNORMAL HIGH (ref 15–41)
AST: 112 U/L — ABNORMAL HIGH (ref 15–41)
Albumin: 3.3 g/dL — ABNORMAL LOW (ref 3.5–5.0)
Albumin: 3.1 g/dL — ABNORMAL LOW (ref 3.5–5.0)
Alkaline Phosphatase: 93 U/L (ref 38–126)
Alkaline Phosphatase: 90 U/L (ref 38–126)
Anion gap: 10 (ref 5–15)
Anion gap: 11 (ref 5–15)
BUN: 6 mg/dL (ref 6–20)
BUN: <5 mg/dL — ABNORMAL LOW (ref 6–20)
CO2: 25 mmol/L (ref 22–32)
CO2: 24 mmol/L (ref 22–32)
Calcium: 8.7 mg/dL — ABNORMAL LOW (ref 8.9–10.3)
Calcium: 8.8 mg/dL — ABNORMAL LOW (ref 8.9–10.3)
Chloride: 106 mmol/L (ref 98–111)
Chloride: 104 mmol/L (ref 98–111)
Creatinine, Ser: 0.51 mg/dL — ABNORMAL LOW (ref 0.61–1.24)
Creatinine, Ser: 0.49 mg/dL — ABNORMAL LOW (ref 0.61–1.24)
GFR, Estimated: >60 mL/min (ref >60)
GFR, Estimated: >60 mL/min (ref >60)
Glucose, Bld: 86 mg/dL (ref 70–99)
Glucose, Bld: 85 mg/dL (ref 70–99)
Potassium: 3.2 mmol/L — ABNORMAL LOW (ref 3.5–5.1)
Potassium: 3.3 mmol/L — ABNORMAL LOW (ref 3.5–5.1)
Sodium: 141 mmol/L (ref 135–145)
Sodium: 139 mmol/L (ref 135–145)
Total Bilirubin: 0.7 mg/dL (ref 0.3–1.2)
Total Bilirubin: 0.9 mg/dL (ref 0.3–1.2)
Total Protein: 8.1 g/dL (ref 6.5–8.1)
Total Protein: 7.8 g/dL (ref 6.5–8.1)

## 2021-08-03 LAB — CBC WITH DIFFERENTIAL/PLATELET
Abs Immature Granulocytes: 0.02 10*3/uL (ref 0.00–0.07)
Basophils Absolute: 0 10*3/uL (ref 0.0–0.1)
Basophils Relative: 1 %
Eosinophils Absolute: 0.1 10*3/uL (ref 0.0–0.5)
Eosinophils Relative: 2 %
HCT: 23.4 % — ABNORMAL LOW (ref 39.0–52.0)
Hemoglobin: 7.1 g/dL — ABNORMAL LOW (ref 13.0–17.0)
Immature Granulocytes: 1 %
Lymphocytes Relative: 50 %
Lymphs Abs: 1.5 10*3/uL (ref 0.7–4.0)
MCH: 24.1 pg — ABNORMAL LOW (ref 26.0–34.0)
MCHC: 30.3 g/dL (ref 30.0–36.0)
MCV: 79.6 fL — ABNORMAL LOW (ref 80.0–100.0)
Monocytes Absolute: 0.2 10*3/uL (ref 0.1–1.0)
Monocytes Relative: 8 %
Neutro Abs: 1.1 10*3/uL — ABNORMAL LOW (ref 1.7–7.7)
Neutrophils Relative %: 38 %
Platelets: 344 10*3/uL (ref 150–400)
RBC: 2.94 MIL/uL — ABNORMAL LOW (ref 4.22–5.81)
RDW: 26.2 % — ABNORMAL HIGH (ref 11.5–15.5)
WBC: 2.9 10*3/uL — ABNORMAL LOW (ref 4.0–10.5)
nRBC: 0 % (ref 0.0–0.2)

## 2021-08-03 LAB — IRON,TIBC AND FERRITIN PANEL
Ferritin: 33 ng/mL (ref 30–400)
Iron Saturation: 4 % — CL (ref 15–55)
Iron: 15 ug/dL — ABNORMAL LOW (ref 38–169)
Total Iron Binding Capacity: 428 ug/dL (ref 250–450)
UIBC: 413 ug/dL — ABNORMAL HIGH (ref 111–343)

## 2021-08-03 LAB — RAPID URINE DRUG SCREEN, HOSP PERFORMED
Amphetamines: NOT DETECTED
Barbiturates: NOT DETECTED
Benzodiazepines: NOT DETECTED
Cocaine: NOT DETECTED
Opiates: NOT DETECTED
Tetrahydrocannabinol: NOT DETECTED

## 2021-08-03 LAB — HEMOGLOBIN A1C
Est. average glucose Bld gHb Est-mCnc: 103 mg/dL
Hgb A1c MFr Bld: 5.2 % (ref 4.8–5.6)

## 2021-08-03 LAB — LIPASE, BLOOD: Lipase: 487 U/L — ABNORMAL HIGH (ref 11–51)

## 2021-08-03 MED ORDER — FOLIC ACID 1 MG PO TABS
1.0000 mg | ORAL_TABLET | Freq: Every day | ORAL | Status: DC
Start: 2021-08-03 — End: 2021-08-04
  Administered 2021-08-03: 1 mg via ORAL
  Filled 2021-08-03: qty 1

## 2021-08-03 MED ORDER — ADULT MULTIVITAMIN W/MINERALS CH
1.0000 | ORAL_TABLET | Freq: Every day | ORAL | Status: DC
  Administered 2021-08-03: 1 via ORAL
  Filled 2021-08-03: qty 1

## 2021-08-03 MED ORDER — LORAZEPAM 1 MG PO TABS
1.0000 mg | ORAL_TABLET | ORAL | Status: DC | PRN
  Administered 2021-08-03 – 2021-08-04 (×2): 1 mg via ORAL
  Filled 2021-08-03 (×2): qty 1

## 2021-08-03 MED ORDER — LORAZEPAM 1 MG PO TABS
0.0000 mg | ORAL_TABLET | Freq: Four times a day (QID) | ORAL | Status: DC
  Administered 2021-08-03 – 2021-08-04 (×4): 1 mg via ORAL
  Filled 2021-08-03: qty 2
  Filled 2021-08-03 (×3): qty 1

## 2021-08-03 MED ORDER — IOHEXOL 300 MG/ML  SOLN
100.0000 mL | Freq: Once | INTRAMUSCULAR | Status: AC | PRN
  Administered 2021-08-03: 100 mL via INTRAVENOUS

## 2021-08-03 MED ORDER — ACETAMINOPHEN 650 MG RE SUPP: 650.0000 mg | RECTAL | Status: DC | PRN

## 2021-08-03 MED ORDER — LORAZEPAM 2 MG/ML IJ SOLN: 1.0000 mg | INTRAMUSCULAR | Status: DC | PRN

## 2021-08-03 MED ORDER — LORAZEPAM 2 MG/ML IJ SOLN: 0.0000 mg | Freq: Four times a day (QID) | INTRAMUSCULAR | Status: DC

## 2021-08-03 MED ORDER — THIAMINE HCL 100 MG PO TABS
100.0000 mg | ORAL_TABLET | Freq: Every day | ORAL | Status: DC
  Administered 2021-08-03: 100 mg via ORAL
  Filled 2021-08-03: qty 1

## 2021-08-03 MED ORDER — HEPARIN SODIUM (PORCINE) 5000 UNIT/ML IJ SOLN
5000.0000 [IU] | Freq: Three times a day (TID) | INTRAMUSCULAR | Status: DC
  Administered 2021-08-03 (×2): 5000 [IU] via SUBCUTANEOUS
  Filled 2021-08-03 (×2): qty 1

## 2021-08-03 MED ORDER — SODIUM CHLORIDE (PF) 0.9 % IJ SOLN
INTRAMUSCULAR | Status: AC
  Filled 2021-08-03: qty 50

## 2021-08-03 MED ORDER — LABETALOL HCL 5 MG/ML IV SOLN: 10.0000 mg | INTRAVENOUS | Status: DC | PRN

## 2021-08-03 MED ORDER — POTASSIUM CHLORIDE 10 MEQ/100ML IV SOLN
10.0000 meq | INTRAVENOUS | Status: AC
  Administered 2021-08-03 (×2): 10 meq via INTRAVENOUS
  Filled 2021-08-03: qty 100

## 2021-08-03 MED ORDER — OXYCODONE HCL 5 MG PO TABS: 5.0000 mg | ORAL_TABLET | Freq: Four times a day (QID) | ORAL | Status: AC | PRN

## 2021-08-03 MED ORDER — LACTATED RINGERS IV BOLUS
1000.0000 mL | Freq: Once | INTRAVENOUS | Status: AC
  Administered 2021-08-03: 1000 mL via INTRAVENOUS

## 2021-08-03 MED ORDER — DEXTROSE IN LACTATED RINGERS 5 % IV SOLN: INTRAVENOUS | Status: DC

## 2021-08-03 MED ORDER — THIAMINE HCL 100 MG/ML IJ SOLN: 100.0000 mg | Freq: Every day | INTRAMUSCULAR | Status: DC

## 2021-08-03 MED ORDER — ONDANSETRON HCL 4 MG/2ML IJ SOLN
4.0000 mg | Freq: Four times a day (QID) | INTRAMUSCULAR | Status: DC | PRN
Start: 2021-08-03 — End: 2021-08-04

## 2021-08-03 MED ORDER — HYDROMORPHONE HCL 1 MG/ML IJ SOLN: 0.5000 mg | Freq: Four times a day (QID) | INTRAMUSCULAR | Status: AC | PRN

## 2021-08-03 MED ORDER — LORAZEPAM 2 MG/ML IJ SOLN: 0.0000 mg | Freq: Two times a day (BID) | INTRAMUSCULAR | Status: DC

## 2021-08-03 MED ORDER — PANTOPRAZOLE SODIUM 40 MG IV SOLR
40.0000 mg | Freq: Two times a day (BID) | INTRAVENOUS | Status: DC
  Administered 2021-08-03 (×2): 40 mg via INTRAVENOUS
  Filled 2021-08-03 (×2): qty 10

## 2021-08-03 MED ORDER — LORAZEPAM 1 MG PO TABS: 0.0000 mg | ORAL_TABLET | Freq: Two times a day (BID) | ORAL | Status: DC

## 2021-08-03 NOTE — H&P (Addendum)
History and Physical    Maylon Sailors GMW:[id number] DOB: 1965-04-08 DOA: 08/02/2021  DOS: the patient was seen and examined on 08/02/2021  PCP: Pcp, No   Patient coming from: Home  I have personally briefly reviewed patient's old medical records in [facility]  CC: abd pain HPI: 56 year old African-American male history of chronic homelessness, chronic pain syndrome, chronic alcoholism, pancreatic insufficiency secondary to alcoholic pancreatitis presents to the ER today with abdominal pain.  He was seen yesterday in the ER.  Diagnosed with alcoholic pancreatitis.  He left AMA.  He returns today with similar complaints of abdominal pain.  He continues to abuse alcohol.  CT imaging today demonstrated acute pancreatitis.  There is a small gallstone.  Prominent hepatic steatosis.  There is a small area of portal venous thrombosis.  This was nonocclusive.  Patient started on CIWA protocol.  Patient initially stated he wanted something to eat and would refuse to be n.p.o.  ED provider went to go talk to him about signing out against medical advice.  Patient recanted and stated that he would remain n.p.o. as recommended.  He desires to be admitted.   ED Course: Lipase elevated but improving.  CT shows incidental portal vein thrombosis.  Shows acute pancreatitis.  Patient started on CIWA protocol.  Review of Systems:  Review of Systems  Unable to perform ROS: Other   Patient uncooperative with review of systems or history. Past Medical History:  Diagnosis Date   Alcohol withdrawal syndrome with complication (HCC)    Alcoholism (HCC)    Elevated AST (SGOT)    Gastrointestinal hemorrhage    Homeless    Hypertension    Pancreatic insufficiency    takes Creon   Symptomatic anemia 11/23/2015   Thrombocytopenia (HCC) 05/09/2021    Past Surgical History:  Procedure Laterality Date   BIOPSY  06/23/2019   Procedure: BIOPSY;  Surgeon: Shellia Cleverly, DO;  Location: [facility] ENDOSCOPY;   Service: Gastroenterology;;   BIOPSY  12/12/2020   Procedure: BIOPSY;  Surgeon: Lynann Bologna, MD;  Location: [facility] ENDOSCOPY;  Service: Endoscopy;;   ESOPHAGOGASTRODUODENOSCOPY (EGD) WITH PROPOFOL N/A 06/23/2019   Procedure: ESOPHAGOGASTRODUODENOSCOPY (EGD) WITH PROPOFOL;  Surgeon: Shellia Cleverly, DO;  Location: [facility] ENDOSCOPY;  Service: Gastroenterology;  Laterality: N/A;   ESOPHAGOGASTRODUODENOSCOPY (EGD) WITH PROPOFOL N/A 12/12/2020   Procedure: ESOPHAGOGASTRODUODENOSCOPY (EGD) WITH PROPOFOL;  Surgeon: Lynann Bologna, MD;  Location: [facility] ENDOSCOPY;  Service: Endoscopy;  Laterality: N/A;   HOT HEMOSTASIS N/A 06/23/2019   Procedure: HOT HEMOSTASIS (ARGON PLASMA COAGULATION/BICAP);  Surgeon: Shellia Cleverly, DO;  Location: [facility] ENDOSCOPY;  Service: Gastroenterology;  Laterality: N/A;     reports that he has been smoking. He has a 30.00 pack-year smoking history. He has never used smokeless tobacco. He reports current alcohol use. He reports that he does not use drugs.  No Known Allergies  Family History  Problem Relation Age of Onset   Diabetes Mellitus II Neg Hx    Colon cancer Neg Hx    Stomach cancer Neg Hx    Pancreatic cancer Neg Hx     Prior to Admission medications   Medication Sig Start Date End Date Taking? Authorizing Provider  acetaminophen (TYLENOL) 325 MG tablet Take 2 tablets (650 mg total) by mouth every 6 (six) hours as needed for fever, moderate pain, mild pain or headache. Patient not taking: Reported on 05/06/2021 12/12/20   Belva Agee, MD  folic acid (FOLVITE) 1 MG tablet Take 1 tablet (1 mg total) by mouth daily.  08/02/21   [name], MD  lipase/protease/amylase (CREON) 36000 UNITS CPEP capsule Take 1 capsule (36,000 Units total) by mouth 3 (three) times daily before meals. 08/02/21   [name], MD  LORazepam (ATIVAN) 0.5 MG tablet Take 4 tablets by mouth 4 times daily for 1 day, THEN 3 tablets 4 times daily for 1 day, THEN 2 tablets 4  times daily  for 1 day, THEN 1 tablet 4  times daily for 1 day. 08/02/21 08/06/21  [name], MD  Multiple Vitamin (MULTIVITAMIN WITH MINERALS) TABS tablet Take 1 tablet by mouth daily. 08/02/21   [name], MD  pantoprazole (PROTONIX) 40 MG tablet Take 1 tablet (40 mg total) by mouth 2 (two) times daily before a meal. 08/02/21   [name], MD  sodium chloride (OCEAN) 0.65 % SOLN nasal spray Place 2 sprays into both nostrils every 4 (four) hours while awake. 08/02/21   [name], MD  sucralfate (CARAFATE) 1 g tablet Take 1 tablet (1 g total) by mouth 4 (four) times daily -  with meals and at bedtime. 08/02/21 09/01/21  [name], MD  thiamine 100 MG tablet Take 1 tablet (100 mg total) by mouth daily. 08/02/21   [name], MD    Physical Exam: Vitals:   08/03/21 0300 08/03/21 0433 08/03/21 0435 08/03/21 0436  BP: (!) 140/92 133/78    Pulse: 91 87 81 79  Resp: 18 16    Temp:      TempSrc:      SpO2: (!) 87% 93% 95% 94%  Weight:      Height:        Physical Exam Vitals and nursing note reviewed.  Constitutional:      General: He is not in acute distress.    Appearance: He is not ill-appearing, toxic-appearing or diaphoretic.     Comments: Sedated but arousable.  Patient refusing to give history.  HENT:     Head: Normocephalic and atraumatic.  Cardiovascular:     Rate and Rhythm: Regular rhythm. Tachycardia present.  Pulmonary:     Effort: Pulmonary effort is normal. No respiratory distress.     Breath sounds: No wheezing or rales.  Abdominal:     Palpations: Abdomen is soft.     Tenderness: There is abdominal tenderness in the epigastric area.  Skin:    General: Skin is warm.     Capillary Refill: Capillary refill takes less than 2 seconds.  Neurological:     Comments: Sedated but arousable.      Labs on Admission: I have personally reviewed following labs and imaging studies  CBC: Recent Labs  Lab 08/02/21 1004 08/02/21 1551 08/03/21 0039  WBC  3.8 3.6* 2.9*  NEUTROABS 2.2 1.8 1.1*  HGB 7.5* 7.1* 7.1*  HCT 23.7* 23.9* 23.4*  MCV 75* 81.0 79.6*  PLT 470* 414* 344   Basic Metabolic Panel: Recent Labs  Lab 08/02/21 1004 08/02/21 1551 08/03/21 0039  NA 139 139 141  K 4.1 3.2* 3.2*  CL 102 106 106  CO2 25 24 25   GLUCOSE 78 89 86  BUN 6 <5* 6  CREATININE 0.55* 0.50* 0.51*  CALCIUM 9.2 8.3* 8.7*   GFR: Estimated Creatinine Clearance: 98.6 mL/min (A) (by C-G formula based on SCr of 0.51 mg/dL (L)). Liver Function Tests: Recent Labs  Lab 08/02/21 1004 08/02/21 1551 08/03/21 0039  AST 130* 112* 110*  ALT 34 33 34  ALKPHOS 123* 102 93  BILITOT  0.5 0.6 0.7  PROT 8.0 8.2* 8.1  ALBUMIN 3.8 3.3* 3.3*   Recent Labs  Lab 08/02/21 1004 08/02/21 1551 08/03/21 0039  LIPASE 1,201* 557* 487*   No results for input(s): "AMMONIA" in the last 168 hours. Coagulation Profile: Recent Labs  Lab 08/02/21 1004  INR 1.0   Cardiac Enzymes: No results for input(s): "CKTOTAL", "CKMB", "CKMBINDEX", "TROPONINI", "TROPONINIHS" in the last 168 hours. BNP (last 3 results) No results for input(s): "PROBNP" in the last 8760 hours. HbA1C: No results for input(s): "HGBA1C" in the last 72 hours. CBG: No results for input(s): "GLUCAP" in the last 168 hours. Lipid Profile: No results for input(s): "CHOL", "HDL", "LDLCALC", "TRIG", "CHOLHDL", "LDLDIRECT" in the last 72 hours. Thyroid Function Tests: No results for input(s): "TSH", "T4TOTAL", "FREET4", "T3FREE", "THYROIDAB" in the last 72 hours. Anemia Panel: No results for input(s): "VITAMINB12", "FOLATE", "FERRITIN", "TIBC", "IRON", "RETICCTPCT" in the last 72 hours. Urine analysis:    Component Value Date/Time   COLORURINE YELLOW 08/02/2021 1543   APPEARANCEUR CLEAR 08/02/2021 1543   LABSPEC 1.004 (L) 08/02/2021 1543   PHURINE 7.0 08/02/2021 1543   GLUCOSEU NEGATIVE 08/02/2021 1543   HGBUR NEGATIVE 08/02/2021 1543   BILIRUBINUR NEGATIVE 08/02/2021 1543   KETONESUR NEGATIVE  08/02/2021 1543   PROTEINUR NEGATIVE 08/02/2021 1543   NITRITE NEGATIVE 08/02/2021 1543   LEUKOCYTESUR NEGATIVE 08/02/2021 1543    Radiological Exams on Admission: I have personally reviewed images CT ABDOMEN PELVIS W CONTRAST  Result Date: 08/03/2021 CLINICAL DATA:  Pancreatitis, acute and severe EXAM: CT ABDOMEN AND PELVIS WITH CONTRAST TECHNIQUE: Multidetector CT imaging of the abdomen and pelvis was performed using the standard protocol following bolus administration of intravenous contrast. RADIATION DOSE REDUCTION: This exam was performed according to the departmental dose-optimization program which includes automated exposure control, adjustment of the mA and/or kV according to patient size and/or use of iterative reconstruction technique. CONTRAST:  100mL OMNIPAQUE IOHEXOL 300 MG/ML  SOLN COMPARISON:  05/06/2021 FINDINGS: Lower chest:  No contributory findings. Hepatobiliary: Prominent hepatic steatosis. No focal abnormality.Small calcified gallstone. No signs of cholecystitis Pancreas: Expanded and indistinct in margination. No necrosis or fluid collection. Small focus of adjacent portal venous thrombosis has developed since prior, eccentric and nonocclusive. Spleen: Unremarkable. Adrenals/Urinary Tract: Negative adrenals. No hydronephrosis or stone. Unremarkable bladder. Stomach/Bowel: Secondary appearing submucosal edema along the medial aspect of the second portion duodenum. No bowel obstruction. Vascular/Lymphatic: Portal venous findings noted above. Atheromatous calcification of the aorta. No mass or adenopathy. Reproductive:Enlargement of the central gland which projects into the bladder base. Other: No ascites or pneumoperitoneum. Umbilical hernia repair using mesh Musculoskeletal: No acute abnormalities. L4-5 and L5-S1 advanced disc degeneration. IMPRESSION: 1. Acute edematous pancreatitis. Small focus of adjacent portal venous thrombus which is nonocclusive. 2. Hepatic steatosis and  cholelithiasis. Electronically Signed   By: Tiburcio PeaJonathan  Watts M.D.   On: 08/03/2021 04:25    EKG: My personal interpretation of EKG shows: NSR    Assessment/Plan Principal Problem:   Pancreatitis, acute Active Problems:   Portal vein thrombosis   Chronic anemia   Alcohol dependence syndrome (HCC)   AVM (arteriovenous malformation) of small bowel, acquired   Chronic pain syndrome   Pancreatic insufficiency   Homeless    Assessment and Plan: * Pancreatitis, acute Observation medical bed. Pt asking for something to eat. Pt told that he will remains NPO until his lipase shows significant improvement. If he cannot follow treatment plan, he can sign out AMA. Continue IVF, NPO status. Prn 5 mg oxycodone  for moderate pain. IV dilaudid 0.5 mg prn severe pain.  Pancreatitis, acute is a Acute on Chronic illness/condition that poses a threat to life or bodily function.   Portal vein thrombosis Noted incidentally on CT abd/pelvis. Unclear if this is acute as pt's last CT abd/pelvis was in 04-2021.  Given pt's propensity for alcohol abuse, pt's chronic homelessness and lack of medical f/u, I think that risks of systemic anticoagulation are greater than any potential benefits. I would not recommended systemic anticoagulation unless pt was able to abstain from alcohol for a minimum of 3 months and was in a stable social situation with adequate medical followup.   Homeless Chronic.  Pancreatic insufficiency Chronic. Will need creon when starting po meds. Keep NPO while having abd pain.  Chronic pain syndrome Chronic. Last valid Rx for opiate was 12-2019. Check UDS.  AVM (arteriovenous malformation) of small bowel, acquired Chronic. Start IV protonix 40 mg bid.  Alcohol dependence syndrome (HCC) Pt with long hx of alcohol abuse. Start CIWA protocol.  Chronic anemia Chronic. HgB at baseline.   DVT prophylaxis: SQ Heparin Code Status: Full Code Family Communication: no family at bedside   Disposition Plan: homeless shelter  Consults called: none  Admission status: Observation, Telemetry bed   Carollee Herter, DO [facility] 08/03/2021, 5:20 AM

## 2021-08-03 NOTE — ED Notes (Signed)
Pt changed into a hospital gown for CT.

## 2021-08-03 NOTE — ED Provider Notes (Addendum)
[facility]-EMERGENCY DEPT [facility] Emergency Department Provider Note MRN:  [id number]  Arrival date & time: 08/03/21     Chief Complaint   Abdominal Pain   History of Present Illness   Samuel Blevins is a 56 y.o. year-old male presents to the ED with chief complaint of epigastric abdominal pain.  Was seen earlier today, but left AGAINST MEDICAL ADVICE so that he could go and drink.  Patient states that he has an alcoholic.  Was diagnosed with alcoholic pancreatitis.  He also reports that he has having some hematemesis and has had some blood in his stools.  He reports that he continues to have epigastric abdominal pain.  States that he is hungry and is requesting food.  History provided by patient.   Review of Systems  Pertinent review of systems noted in HPI.    Physical Exam   Vitals:   08/03/21 0435 08/03/21 0436  BP:    Pulse: 81 79  Resp:    Temp:    SpO2: 95% 94%    CONSTITUTIONAL:  chronically ill-appearing, NAD NEURO:  Alert and oriented x 3, CN 3-12 grossly intact EYES:  eyes equal and reactive ENT/NECK:  Supple, no stridor  CARDIO:  normal rate, regular rhythm, appears well-perfused  PULM:  No respiratory distress,  GI/GU:  epigastric abdominal tenderness MSK/SPINE:  No gross deformities, no edema, moves all extremities  SKIN:  no rash, atraumatic   *Additional and/or pertinent findings included in MDM below  Diagnostic and Interventional Summary    EKG Interpretation  Date/Time:    Ventricular Rate:    PR Interval:    QRS Duration:   QT Interval:    QTC Calculation:   R Axis:     Text Interpretation:         Labs Reviewed  CBC WITH DIFFERENTIAL/PLATELET - Abnormal; Notable for the following components:      Result Value   WBC 2.9 (*)    RBC 2.94 (*)    Hemoglobin 7.1 (*)    HCT 23.4 (*)    MCV 79.6 (*)    MCH 24.1 (*)    RDW 26.2 (*)    Neutro Abs 1.1 (*)    All other components within normal limits  COMPREHENSIVE METABOLIC PANEL -  Abnormal; Notable for the following components:   Potassium 3.2 (*)    Creatinine, Ser 0.51 (*)    Calcium 8.7 (*)    Albumin 3.3 (*)    AST 110 (*)    All other components within normal limits  LIPASE, BLOOD - Abnormal; Notable for the following components:   Lipase 487 (*)    All other components within normal limits  RAPID URINE DRUG SCREEN, HOSP PERFORMED    CT ABDOMEN PELVIS W CONTRAST  Final Result      Medications  sodium chloride (PF) 0.9 % injection (  Not Given 08/03/21 0508)  LORazepam (ATIVAN) injection 0-4 mg ( Intravenous Not Given 08/03/21 0204)    Or  LORazepam (ATIVAN) tablet 0-4 mg ( Oral See Alternative 08/03/21 0204)  LORazepam (ATIVAN) injection 0-4 mg (has no administration in time range)    Or  LORazepam (ATIVAN) tablet 0-4 mg (has no administration in time range)  thiamine tablet 100 mg (has no administration in time range)    Or  thiamine (B-1) injection 100 mg (has no administration in time range)  lactated ringers bolus 1,000 mL (0 mLs Intravenous Stopped 08/03/21 0202)  iohexol (OMNIPAQUE) 300 MG/ML solution 100 mL (100  mLs Intravenous Contrast Given 08/03/21 0348)  lactated ringers bolus 1,000 mL (1,000 mLs Intravenous New Bag/Given 08/03/21 0439)     Procedures  /  Critical Care Procedures  ED Course and Medical Decision Making  I have reviewed the triage vital signs, the nursing notes, and pertinent available records from the EMR.  Social Determinants Affecting Complexity of Care: Patient has no clinically significant social determinants affecting this chief complaint..   ED Course:  Patient here with epigastric abdominal pain.  Top differential diagnoses include pancreatitis, gastritis. Medical Decision Making Patient is an alcoholic here with abdominal pain and vomiting.  Was seen yesterday and had elevated lipase.  Left AMA prior to CT scan to go drink.    Problems Addressed: Acute pancreatitis, unspecified complication status, unspecified  pancreatitis type: acute illness or injury Portal vein thrombosis: undiagnosed new problem with uncertain prognosis  Amount and/or Complexity of Data Reviewed Labs: ordered.    Details: Lipase today is in the high 400s, downtrending from yesterday, but patient still having significant pain.  Hemoglobin is stable. Radiology: ordered.  Risk OTC drugs. Prescription drug management. Parenteral controlled substances. Decision regarding hospitalization.     Consultants: I discussed the case with Hospitalist, Dr. Imogene Burn, who is appreciated for admitting.   Treatment and Plan: Patient's exam and diagnostic results are concerning for acute pancreatitis.  Feel that patient will need admission to the hospital for further treatment and evaluation.    Final Clinical Impressions(s) / ED Diagnoses     ICD-10-CM   1. Portal vein thrombosis  I81     2. Acute pancreatitis, unspecified complication status, unspecified pancreatitis type  K85.90       ED Discharge Orders     None         Discharge Instructions Discussed with and Provided to Patient:     Discharge Instructions      You were diagnosed with acute pancreatitis as well as portal vein thrombosis.  You were recommended to be admitted to the hospital, but declined this.  You may return at any time.  Please limit alcohol use.  Please follow-up with the clinic listed above.       Jheremy, Boger, PA-C 08/03/21 0511    Roxy Horseman, PA-C 08/03/21 0553    Roxy Horseman, PA-C 08/03/21 6283    Nicanor Alcon, April, MD 08/03/21 1517

## 2021-08-03 NOTE — Assessment & Plan Note (Signed)
Chronic. Start IV protonix 40 mg bid.

## 2021-08-03 NOTE — Assessment & Plan Note (Addendum)
Noted incidentally on CT abd/pelvis. Unclear if this is acute as pt's last CT abd/pelvis was in 04-2021.  Given pt's propensity for alcohol abuse, pt's chronic homelessness and lack of medical f/u, I think that risks of systemic anticoagulation are greater than any potential benefits. I would not recommended systemic anticoagulation unless pt was able to abstain from alcohol for a minimum of 3 months and was in a stable social situation with adequate medical followup.

## 2021-08-03 NOTE — Discharge Instructions (Signed)
You were diagnosed with acute pancreatitis as well as portal vein thrombosis.  You were recommended to be admitted to the hospital, but declined this.  You may return at any time.  Please limit alcohol use.  Please follow-up with the clinic listed above.

## 2021-08-03 NOTE — Assessment & Plan Note (Signed)
Chronic. 

## 2021-08-03 NOTE — H&P (View-Only) (Signed)
Referring Provider: Dr. Darrick Meigs Primary Care Physician:  [name], MD Primary Gastroenterologist:  Dr. Bryan Lemma   Reason for Consultation: Pancreatitis, hematemesis  HPI: Samuel Blevins is a 56 y.o. male with a past medical history of pretension, alcohol use disorder, pancreatitis 09/2020, GI bleed 11/2020 and 04/2021, duodenal AVMs,  H.Pylori gastritis, IDA, hepatic steatosis and thrombocytopenia.  He is currently homeless.  He was seen by his PCP Dr. Joya Gaskins yesterday for follow-up regarding pancreatitis. At that time, he was actively drinking vodka daily with worsening epigastric pain.   Labs done by  Dr. Joya Gaskins yesterday showed a WBC count of 3.8.  Hemoglobin 7.5 (Hg 7.5 on 05/13/2021).  MCV 75.  Platelet 470.  BUN 6.  Creatinine 0.56.  Total bili 0.5.  Alk phos 123.  AST 130.  ALT 34.  Lipase 1,201.  INR 1.0.  After his lab results showed evidence of acute pancreatitis he was sent to [facility] ED for further evaluation.  Labs in the ED showed a WBC count of 3.6.  Hemoglobin 7.1.  Platelet 414.  Calcium 3.2.  BUN less than 5.  Creatinine 0.5.  Total bili 0.6.  Alk phos 102.  AST 112.  ALT 33.  Lipase 557.  EKG showed a normal sinus rhythm.  He refused a rectal exam, IV, CT scan and left AMA.   Presented back to [facility] ED early this morning with complaints of worsening epigastric pain and hematemesis.  Labs in the ED showed WBC count of 2.9.  Hemoglobin 7.1.  Platelet 344. 6.  Creatinine 0.51.  Potassium 3.2.  Total bili 0.7.  Alk phos 93.  AST 110.  ALT 34.  Lipase 47.  Urine drug screen negative. CTAP with contrast identified acute edematous pancreatitis, small focus of adjacent portal venous thrombus which is nonocclusive, hepatic steatosis and cholelithiasis.  He reported vomiting up coffee-ground emesis x 2 yesterday. A GI consult was requested for further evaluation regarding hematemesis and recurrent pancreatitis.  He currently denies having any epigastric  pain.  He reported vomiting dark red blood x 2 yesterday with epistaxis.  He drinks 1 pint of vodka every other day.  No dysphagia or heartburn.  No rectal bleeding or melena.  His last bowel movement was yesterday, reported as normal and brown in color.  No NSAID use.  No drug use.  He was admitted to the hospital 05/07/2021 with acute onset hematemesis. Seen in the ED a few days prior for the same symptoms but left AMA. Hg 8.5 -> 6.8. One unit of PRBCs was ordered but he refused any blood transfusions or IV iron. Our GI service was consulted but endoscopic evaluation was deferred as he was deemed too high risk for this procedure at that time.  His hemoglobin stabilized without further active GI bleeding and he was discharged home on 05/13/2021.  History of a GI bleed 11/2020 which also required hospital admission. EGD done during that admission abdominal wall with a single duodenal erosion without active bleeding and mild gastritis.  Gastric biopsies were positive for Helicobacter pylori.  He had acute pancreatitis with associated cystic lesion on CT scan in last August. 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    PAST GI PROCEDURES:  EGD 12/12/2020 - Mild gastritis - Single duodenal erosion without bleeding. No UGI bleeding - The examination was otherwise normal. A. STOMACH, BODY/ANTRUM, BIOPSY:  - Gastric antral and  oxyntic mucosa with mild chronic gastritis  - Warthin Starry stain is negative for Helicobacter pylori   EGD 06/23/2019: Normal esophagus -Gastritis. Biopsied. 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 Treated with argon plasma coagulation (APC). - Mucosal changes in the duodenum. Biopsied.   DUODENUM, BIOPSY:  - Duodenal mucosa with peptic injury.  - No adenomatous change or carcinoma.   B. STOMACH, BIOPSY:  - Mildly active Helicobacter pylori gastritis.  - Warthin-Starry shows rare  organisms consistent with Helicobacter  pylori.  - No intestinal metaplasia, dysplasia or carcinoma.   Image studies:  08/03/2021 CTAP: 1. Acute edematous pancreatitis. Small focus of adjacent portal venous thrombus which is nonocclusive. 2. Hepatic steatosis and cholelithiasis.   09/2019 CTAP showed 3.7 x 2.4 lobulated cystic process at pancreatic head.  Fat-containing umbilicus and supraumbilical hernias.  Hepatic steatosis.  Prostamegaly with nodular prostate.  Avascular necrosis R femoral head.  12/08/2019 MRI abdomen.  Resolved cystic area at the pancreaticoduodenal groove.  Edema at descending duodenum, cannot exclude duodenitis but favor groove pancreatitis.  Marked, diffuse hepatic steatosis.  Degenerative lumbar spine disease.  12/07/2020 CTAP showed groove pancreatitis no fluid collections.  Small area in the pancreatic head might represent interstitial edema or tiny area of necrosis.  Stable hepatomegaly and severe hepatic steatosis   Past Medical History:  Diagnosis Date   Alcohol withdrawal syndrome with complication ([place])    Alcoholism ([place])    Elevated AST (SGOT)    Gastrointestinal hemorrhage    Homeless    Hypertension    Pancreatic insufficiency    takes Creon   Symptomatic anemia 11/23/2015   Thrombocytopenia ([place]) 05/09/2021    Past Surgical History:  Procedure Laterality Date   BIOPSY  06/23/2019   Procedure: BIOPSY;  Surgeon: Lavena Bullion, DO;  Location: [place] ENDOSCOPY;  Service: Gastroenterology;;   BIOPSY  12/12/2020   Procedure: BIOPSY;  Surgeon: Jackquline Denmark, MD;  Location: [facility] ENDOSCOPY;  Service: Endoscopy;;   ESOPHAGOGASTRODUODENOSCOPY (EGD) WITH PROPOFOL N/A 06/23/2019   Procedure: ESOPHAGOGASTRODUODENOSCOPY (EGD) WITH PROPOFOL;  Surgeon: Lavena Bullion, DO;  Location: [place];  Service: Gastroenterology;  Laterality: N/A;   ESOPHAGOGASTRODUODENOSCOPY (EGD) WITH PROPOFOL N/A 12/12/2020   Procedure: ESOPHAGOGASTRODUODENOSCOPY (EGD) WITH  PROPOFOL;  Surgeon: Jackquline Denmark, MD;  Location: [facility] ENDOSCOPY;  Service: Endoscopy;  Laterality: N/A;   HOT HEMOSTASIS N/A 06/23/2019   Procedure: HOT HEMOSTASIS (ARGON PLASMA COAGULATION/BICAP);  Surgeon: Lavena Bullion, DO;  Location: [facility] ENDOSCOPY;  Service: Gastroenterology;  Laterality: N/A;    Prior to Admission medications   Medication Sig Start Date End Date Taking? Authorizing Provider  acetaminophen (TYLENOL) 325 MG tablet Take 2 tablets (650 mg total) by mouth every 6 (six) hours as needed for fever, moderate pain, mild pain or headache. Patient not taking: Reported on 05/06/2021 12/12/20   Riesa Pope, MD  folic acid (FOLVITE) 1 MG tablet Take 1 tablet (1 mg total) by mouth daily. 08/02/21   [name], MD  lipase/protease/amylase (CREON) 36000 UNITS CPEP capsule Take 1 capsule (36,000 Units total) by mouth 3 (three) times daily before meals. 08/02/21   [name], MD  LORazepam (ATIVAN) 0.5 MG tablet Take 4 tablets by mouth 4 times daily for 1 day, THEN 3 tablets 4 times daily for 1 day, THEN 2 tablets 4  times daily for 1 day, THEN 1 tablet 4  times daily for 1 day. 08/02/21 08/06/21  [name], MD  Multiple Vitamin (MULTIVITAMIN WITH MINERALS) TABS tablet Take 1 tablet by mouth daily.  08/02/21   [name], MD  pantoprazole (PROTONIX) 40 MG tablet Take 1 tablet (40 mg total) by mouth 2 (two) times daily before a meal. 08/02/21   [name], MD  sodium chloride (OCEAN) 0.65 % SOLN nasal spray Place 2 sprays into both nostrils every 4 (four) hours while awake. 08/02/21   [name], MD  sucralfate (CARAFATE) 1 g tablet Take 1 tablet (1 g total) by mouth 4 (four) times daily -  with meals and at bedtime. 08/02/21 09/01/21  [name], MD  thiamine 100 MG tablet Take 1 tablet (100 mg total) by mouth daily. 08/02/21   [name], MD    Current Facility-Administered Medications  Medication Dose Route Frequency Provider Last Rate Last  Admin   acetaminophen (TYLENOL) suppository 650 mg  650 mg Rectal Q4H PRN Kristopher Oppenheim, DO       dextrose 5 % in lactated ringers infusion   Intravenous Continuous Kristopher Oppenheim, DO 75 mL/hr at 08/03/21 0844 New Bag at 40/08/67 6195   folic acid (FOLVITE) tablet 1 mg  1 mg Oral Daily Kristopher Oppenheim, DO       heparin injection 5,000 Units  5,000 Units Subcutaneous Q8H Kristopher Oppenheim, DO       oxyCODONE (Oxy IR/ROXICODONE) immediate release tablet 5 mg  5 mg Oral Q6H PRN Kristopher Oppenheim, DO       Or   HYDROmorphone (DILAUDID) injection 0.5 mg  0.5 mg Intravenous Q6H PRN Kristopher Oppenheim, DO       labetalol (NORMODYNE) injection 10 mg  10 mg Intravenous Q4H PRN Kristopher Oppenheim, DO       LORazepam (ATIVAN) injection 0-4 mg  0-4 mg Intravenous Q6H Kristopher Oppenheim, DO       Or   LORazepam (ATIVAN) tablet 0-4 mg  0-4 mg Oral Q6H Kristopher Oppenheim, DO       [START ON 08/05/2021] LORazepam (ATIVAN) injection 0-4 mg  0-4 mg Intravenous Q12H Kristopher Oppenheim, DO       Or   Derrill Memo ON 08/05/2021] LORazepam (ATIVAN) tablet 0-4 mg  0-4 mg Oral Q12H Kristopher Oppenheim, DO       LORazepam (ATIVAN) tablet 1-4 mg  1-4 mg Oral Q1H PRN Kristopher Oppenheim, DO       Or   LORazepam (ATIVAN) injection 1-4 mg  1-4 mg Intravenous Q1H PRN Kristopher Oppenheim, DO       multivitamin with minerals tablet 1 tablet  1 tablet Oral Daily Kristopher Oppenheim, DO       ondansetron [facility]) injection 4 mg  4 mg Intravenous Q6H PRN Kristopher Oppenheim, DO       pantoprazole (PROTONIX) injection 40 mg  40 mg Intravenous Q12H Kristopher Oppenheim, DO       sodium chloride (PF) 0.9 % injection            thiamine tablet 100 mg  100 mg Oral Daily Kristopher Oppenheim, DO       Or   thiamine (B-1) injection 100 mg  100 mg Intravenous Daily Kristopher Oppenheim, DO        Allergies as of 08/02/2021   (No Known Allergies)    Family History  Problem Relation Age of Onset   Diabetes Mellitus II Neg Hx    Colon cancer Neg Hx    Stomach cancer Neg Hx    Pancreatic cancer Neg Hx     Social History   Socioeconomic History   Marital status:  Single  Spouse name: Not on file   Number of children: Not on file   Years of education: Not on file   Highest education level: Not on file  Occupational History   Not on file  Tobacco Use   Smoking status: Every Day    Packs/day: 1.00    Years: 30.00    Total pack years: 30.00    Types: Cigarettes   Smokeless tobacco: Never  Vaping Use   Vaping Use: Never used  Substance and Sexual Activity   Alcohol use: Yes   Drug use: Never   Sexual activity: Not Currently  Other Topics Concern   Not on file  Social History Narrative   Not on file   Social Determinants of Health   Financial Resource Strain: Not on file  Food Insecurity: Not on file  Transportation Needs: Not on file  Physical Activity: Not on file  Stress: Not on file  Social Connections: Not on file  Intimate Partner Violence: Not on file    Review of Systems: Gen: Denies fever, sweats or chills. No weight loss.  CV: Denies chest pain, palpitations or edema. Resp: Denies cough, shortness of breath of hemoptysis.  GI.  See HPI.  No dysphagia or heartburn. GU : Denies urinary burning, blood in urine, increased urinary frequency or incontinence. MS: Denies joint pain, muscles aches or weakness. Derm: Denies rash, itchiness, skin lesions or unhealing ulcers. Psych: + Anxiety and depression.  Heme: Denies easy bruising, bleeding. Neuro:  Denies headaches, dizziness or paresthesias. Endo:  Denies any problems with DM, thyroid or adrenal function.  Physical Exam: Vital signs in last 24 hours: Temp:  [97.9 F (36.6 C)-99.2 F (37.3 C)] 97.9 F (36.6 C) (06/09 0839) Pulse Rate:  [76-96] 79 (06/09 0839) Resp:  [15-20] 18 (06/09 0839) BP: (115-150)/(76-98) 141/88 (06/09 0839) SpO2:  [87 %-97 %] 93 % (06/09 0839) Weight:  [79.4 kg] 79.4 kg (06/08 2336)   General:  Alert 74-year-old male in no acute distress Head:  Normocephalic and atraumatic. Eyes:  No scleral icterus. Conjunctiva pink. Ears:  Normal  auditory acuity. Nose:  No deformity, discharge or lesions. Mouth:  Dentition intact. No ulcers or lesions.  Neck:  Supple. No lymphadenopathy or thyromegaly.  Lungs: Breath sounds clear throughout Heart: Regular rate and rhythm, no murmurs Abdomen: Soft, nondistended.  Nontender.  Positive bowel sounds all 4 quadrants. Rectal: Deferred. Musculoskeletal:  Symmetrical without gross deformities.  Pulses:  Normal pulses noted. Extremities:  Without clubbing or edema. Neurologic:  Alert and  oriented x 4. No focal deficits.  Skin:  Intact without significant lesions or rashes. Psych:  Alert and cooperative. Normal mood and affect.  Intake/Output from previous day: No intake/output data recorded. Intake/Output this shift: No intake/output data recorded.  Lab Results: Recent Labs    08/02/21 1004 08/02/21 1551 08/03/21 0039  WBC 3.8 3.6* 2.9*  HGB 7.5* 7.1* 7.1*  HCT 23.7* 23.9* 23.4*  PLT 470* 414* 344   BMET Recent Labs    08/02/21 1004 08/02/21 1551 08/03/21 0039  NA 139 139 141  K 4.1 3.2* 3.2*  CL 102 106 106  CO2 _0 GLUCOSE 78 89 86  BUN 6 <5* 6  CREATININE 0.55* 0.50* 0.51*  CALCIUM 9.2 8.3* 8.7*   LFT Recent Labs    08/03/21 0039  PROT 8.1  ALBUMIN 3.3*  AST 110*  ALT 34  ALKPHOS 93  BILITOT 0.7   PT/INR Recent Labs    08/02/21 1004  LABPROT  10.3  INR 1.0   Hepatitis Panel No results for input(s): "HEPBSAG", "HCVAB", "HEPAIGM", "HEPBIGM" in the last 72 hours.    Studies/Results: CT ABDOMEN PELVIS W CONTRAST  Result Date: 08/03/2021 CLINICAL DATA:  Pancreatitis, acute and severe EXAM: CT ABDOMEN AND PELVIS WITH CONTRAST TECHNIQUE: Multidetector CT imaging of the abdomen and pelvis was performed using the standard protocol following bolus administration of intravenous contrast. RADIATION DOSE REDUCTION: This exam was performed according to the departmental dose-optimization program which includes automated exposure control, adjustment of  the mA and/or kV according to patient size and/or use of iterative reconstruction technique. CONTRAST:  168m OMNIPAQUE IOHEXOL 300 MG/ML  SOLN COMPARISON:  05/06/2021 FINDINGS: Lower chest:  No contributory findings. Hepatobiliary: Prominent hepatic steatosis. No focal abnormality.Small calcified gallstone. No signs of cholecystitis Pancreas: Expanded and indistinct in margination. No necrosis or fluid collection. Small focus of adjacent portal venous thrombosis has developed since prior, eccentric and nonocclusive. Spleen: Unremarkable. Adrenals/Urinary Tract: Negative adrenals. No hydronephrosis or stone. Unremarkable bladder. Stomach/Bowel: Secondary appearing submucosal edema along the medial aspect of the second portion duodenum. No bowel obstruction. Vascular/Lymphatic: Portal venous findings noted above. Atheromatous calcification of the aorta. No mass or adenopathy. Reproductive:Enlargement of the central gland which projects into the bladder base. Other: No ascites or pneumoperitoneum. Umbilical hernia repair using mesh Musculoskeletal: No acute abnormalities. L4-5 and L5-S1 advanced disc degeneration. IMPRESSION: 1. Acute edematous pancreatitis. Small focus of adjacent portal venous thrombus which is nonocclusive. 2. Hepatic steatosis and cholelithiasis. Electronically Signed   By: JJorje GuildM.D.   On: 08/03/2021 04:25    IMPRESSION/PLAN:  162 56year old male with a history of H. Pylori gastritis (not treated), duodenal AVM,  UGI bleed with recurrent hematemesis, epistaxis and anemia. Hg 7.1.  No further hematemesis since admission.  Hemodynamically stable. -Clear liquid diet -Pantoprazole 40 mg IV twice daily -Consider EGD during this hospital admission if he does not exhibit any signs of significant alcohol withdrawal.  At this time, the patient is willing to undergo an EGD -Monitor H&H closely -Recommend PRBC transfusion for hemoglobin < 8. Note, patient previously refused blood  transfusions and IV iron -Eventual colonoscopy as an outpatient  2) Acute alcohol associated pancreatitis. Lipase 1,201 -> 557. CTAP identified acute edematous pancreatitis. -Continue IV fluid -Ondansetron 4 mg p.o. or IV every 6 hours as needed -Pain management per the hospitalist -Folate, thiamine and multivitamin daily  3) Alcohol use disorder with hepatic steatosis. Elevated Alk phos and AST levels.  CTAP consistent with hepatic steatosis without worrisome liver lesions. -Strongly encouraged alcohol abstinence -CIWA protocol in process  4) CTAP identified a small focus of adjacent portal venous thrombus which is nonocclusive. Unlikely candidate for long term anticoagulation secondary to noncompliance and homelessness status.  5) Cholelithiasis  Further recommendations per Dr. CChryl HeckMDorathy Daft 08/03/2021, 9:29 AM  I have taken a history, reviewed the chart and examined the patient. I performed a substantive portion of this encounter, including complete performance of at least one of the key components, in conjunction with the APP. I agree with the APP's note, impression and recommendations but would recommend transfusing for hemoglobin below 721  56year old male with alcoholism and history of alcohol induced pancreatitis, admitted with 1 episode of hematemesis and acute on chronic anemia.  He also has evidence of pancreatitis based on labs and imaging, but denies abdominal pain and is nontender on examination.  He has a small portal vein thrombosis, which is likely not symptomatic,  and most likely secondary to his pancreatitis. He has a history of upper GI bleeding from gastrointestinal AVMs and duodenal erosions.  No history of varices.  -Recommended we perform an upper endoscopy tomorrow to exclude high risk lesions and potentially ablate any AVMs that are contributing to his chronic anemia. - Okay with clear liquid diet today, continue twice daily Protonix IV - I  agree with decision not to anticoagulate portal vein thrombosis given bleeding risk and unclear benefit of anticoagulation  Danyela Posas E. Candis Schatz, MD [facility] Gastroenterology

## 2021-08-03 NOTE — ED Notes (Addendum)
PA at bedside.

## 2021-08-03 NOTE — Assessment & Plan Note (Signed)
Chronic. Last valid Rx for opiate was 12-2019. Check UDS.

## 2021-08-03 NOTE — Assessment & Plan Note (Addendum)
Observation medical bed. Pt asking for something to eat. Pt told that he will remains NPO until his lipase shows significant improvement. If he cannot follow treatment plan, he can sign out AMA. Continue IVF, NPO status. Prn 5 mg oxycodone for moderate pain. IV dilaudid 0.5 mg prn severe pain.  Pancreatitis, acute is a Acute on Chronic illness/condition that poses a threat to life or bodily function.

## 2021-08-03 NOTE — Consult Note (Addendum)
Referring Provider: Dr. Darrick Meigs Primary Care Physician:  [name], MD Primary Gastroenterologist:  Dr. Bryan Lemma   Reason for Consultation: Pancreatitis, hematemesis  HPI: Samuel Blevins is a 56 y.o. male with a past medical history of pretension, alcohol use disorder, pancreatitis 09/2020, GI bleed 11/2020 and 04/2021, duodenal AVMs,  H.Pylori gastritis, IDA, hepatic steatosis and thrombocytopenia.  He is currently homeless.  He was seen by his PCP Dr. Joya Gaskins yesterday for follow-up regarding pancreatitis. At that time, he was actively drinking vodka daily with worsening epigastric pain.   Labs done by  Dr. Joya Gaskins yesterday showed a WBC count of 3.8.  Hemoglobin 7.5 (Hg 7.5 on 05/13/2021).  MCV 75.  Platelet 470.  BUN 6.  Creatinine 0.56.  Total bili 0.5.  Alk phos 123.  AST 130.  ALT 34.  Lipase 1,201.  INR 1.0.  After his lab results showed evidence of acute pancreatitis he was sent to [facility] ED for further evaluation.  Labs in the ED showed a WBC count of 3.6.  Hemoglobin 7.1.  Platelet 414.  Calcium 3.2.  BUN less than 5.  Creatinine 0.5.  Total bili 0.6.  Alk phos 102.  AST 112.  ALT 33.  Lipase 557.  EKG showed a normal sinus rhythm.  He refused a rectal exam, IV, CT scan and left AMA.   Presented back to [facility] ED early this morning with complaints of worsening epigastric pain and hematemesis.  Labs in the ED showed WBC count of 2.9.  Hemoglobin 7.1.  Platelet 344. 6.  Creatinine 0.51.  Potassium 3.2.  Total bili 0.7.  Alk phos 93.  AST 110.  ALT 34.  Lipase 47.  Urine drug screen negative. CTAP with contrast identified acute edematous pancreatitis, small focus of adjacent portal venous thrombus which is nonocclusive, hepatic steatosis and cholelithiasis.  He reported vomiting up coffee-ground emesis x 2 yesterday. A GI consult was requested for further evaluation regarding hematemesis and recurrent pancreatitis.  He currently denies having any epigastric  pain.  He reported vomiting dark red blood x 2 yesterday with epistaxis.  He drinks 1 pint of vodka every other day.  No dysphagia or heartburn.  No rectal bleeding or melena.  His last bowel movement was yesterday, reported as normal and brown in color.  No NSAID use.  No drug use.  He was admitted to the hospital 05/07/2021 with acute onset hematemesis. Seen in the ED a few days prior for the same symptoms but left AMA. Hg 8.5 -> 6.8. One unit of PRBCs was ordered but he refused any blood transfusions or IV iron. Our GI service was consulted but endoscopic evaluation was deferred as he was deemed too high risk for this procedure at that time.  His hemoglobin stabilized without further active GI bleeding and he was discharged home on 05/13/2021.  History of a GI bleed 11/2020 which also required hospital admission. EGD done during that admission abdominal wall with a single duodenal erosion without active bleeding and mild gastritis.  Gastric biopsies were positive for Helicobacter pylori.  He had acute pancreatitis with associated cystic lesion on CT scan in last August. 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    PAST GI PROCEDURES:  EGD 12/12/2020 - Mild gastritis - Single duodenal erosion without bleeding. No UGI bleeding - The examination was otherwise normal. A. STOMACH, BODY/ANTRUM, BIOPSY:  - Gastric antral and  oxyntic mucosa with mild chronic gastritis  - Warthin Starry stain is negative for Helicobacter pylori   EGD 06/23/2019: Normal esophagus -Gastritis. Biopsied. 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 Treated with argon plasma coagulation (APC). - Mucosal changes in the duodenum. Biopsied.   DUODENUM, BIOPSY:  - Duodenal mucosa with peptic injury.  - No adenomatous change or carcinoma.   B. STOMACH, BIOPSY:  - Mildly active Helicobacter pylori gastritis.  - Warthin-Starry shows rare  organisms consistent with Helicobacter  pylori.  - No intestinal metaplasia, dysplasia or carcinoma.   Image studies:  08/03/2021 CTAP: 1. Acute edematous pancreatitis. Small focus of adjacent portal venous thrombus which is nonocclusive. 2. Hepatic steatosis and cholelithiasis.   09/2019 CTAP showed 3.7 x 2.4 lobulated cystic process at pancreatic head.  Fat-containing umbilicus and supraumbilical hernias.  Hepatic steatosis.  Prostamegaly with nodular prostate.  Avascular necrosis R femoral head.  12/08/2019 MRI abdomen.  Resolved cystic area at the pancreaticoduodenal groove.  Edema at descending duodenum, cannot exclude duodenitis but favor groove pancreatitis.  Marked, diffuse hepatic steatosis.  Degenerative lumbar spine disease.  12/07/2020 CTAP showed groove pancreatitis no fluid collections.  Small area in the pancreatic head might represent interstitial edema or tiny area of necrosis.  Stable hepatomegaly and severe hepatic steatosis   Past Medical History:  Diagnosis Date   Alcohol withdrawal syndrome with complication ([place])    Alcoholism ([place])    Elevated AST (SGOT)    Gastrointestinal hemorrhage    Homeless    Hypertension    Pancreatic insufficiency    takes Creon   Symptomatic anemia 11/23/2015   Thrombocytopenia ([place]) 05/09/2021    Past Surgical History:  Procedure Laterality Date   BIOPSY  06/23/2019   Procedure: BIOPSY;  Surgeon: Lavena Bullion, DO;  Location: [place] ENDOSCOPY;  Service: Gastroenterology;;   BIOPSY  12/12/2020   Procedure: BIOPSY;  Surgeon: Jackquline Denmark, MD;  Location: [facility] ENDOSCOPY;  Service: Endoscopy;;   ESOPHAGOGASTRODUODENOSCOPY (EGD) WITH PROPOFOL N/A 06/23/2019   Procedure: ESOPHAGOGASTRODUODENOSCOPY (EGD) WITH PROPOFOL;  Surgeon: Lavena Bullion, DO;  Location: [facility];  Service: Gastroenterology;  Laterality: N/A;   ESOPHAGOGASTRODUODENOSCOPY (EGD) WITH PROPOFOL N/A 12/12/2020   Procedure: ESOPHAGOGASTRODUODENOSCOPY (EGD) WITH  PROPOFOL;  Surgeon: Jackquline Denmark, MD;  Location: [facility] ENDOSCOPY;  Service: Endoscopy;  Laterality: N/A;   HOT HEMOSTASIS N/A 06/23/2019   Procedure: HOT HEMOSTASIS (ARGON PLASMA COAGULATION/BICAP);  Surgeon: Lavena Bullion, DO;  Location: [facility] ENDOSCOPY;  Service: Gastroenterology;  Laterality: N/A;    Prior to Admission medications   Medication Sig Start Date End Date Taking? Authorizing Provider  acetaminophen (TYLENOL) 325 MG tablet Take 2 tablets (650 mg total) by mouth every 6 (six) hours as needed for fever, moderate pain, mild pain or headache. Patient not taking: Reported on 05/06/2021 12/12/20   Riesa Pope, MD  folic acid (FOLVITE) 1 MG tablet Take 1 tablet (1 mg total) by mouth daily. 08/02/21   [name], MD  lipase/protease/amylase (CREON) 36000 UNITS CPEP capsule Take 1 capsule (36,000 Units total) by mouth 3 (three) times daily before meals. 08/02/21   [name], MD  LORazepam (ATIVAN) 0.5 MG tablet Take 4 tablets by mouth 4 times daily for 1 day, THEN 3 tablets 4 times daily for 1 day, THEN 2 tablets 4  times daily for 1 day, THEN 1 tablet 4  times daily for 1 day. 08/02/21 08/06/21  [name], MD  Multiple Vitamin (MULTIVITAMIN WITH MINERALS) TABS tablet Take 1 tablet by mouth daily.  08/02/21   [name], MD  pantoprazole (PROTONIX) 40 MG tablet Take 1 tablet (40 mg total) by mouth 2 (two) times daily before a meal. 08/02/21   [name], MD  sodium chloride (OCEAN) 0.65 % SOLN nasal spray Place 2 sprays into both nostrils every 4 (four) hours while awake. 08/02/21   [name], MD  sucralfate (CARAFATE) 1 g tablet Take 1 tablet (1 g total) by mouth 4 (four) times daily -  with meals and at bedtime. 08/02/21 09/01/21  [name], MD  thiamine 100 MG tablet Take 1 tablet (100 mg total) by mouth daily. 08/02/21   [name], MD    Current Facility-Administered Medications  Medication Dose Route Frequency Provider Last Rate Last  Admin   acetaminophen (TYLENOL) suppository 650 mg  650 mg Rectal Q4H PRN Kristopher Oppenheim, DO       dextrose 5 % in lactated ringers infusion   Intravenous Continuous Kristopher Oppenheim, DO 75 mL/hr at 08/03/21 0844 New Bag at 40/08/67 6195   folic acid (FOLVITE) tablet 1 mg  1 mg Oral Daily Kristopher Oppenheim, DO       heparin injection 5,000 Units  5,000 Units Subcutaneous Q8H Kristopher Oppenheim, DO       oxyCODONE (Oxy IR/ROXICODONE) immediate release tablet 5 mg  5 mg Oral Q6H PRN Kristopher Oppenheim, DO       Or   HYDROmorphone (DILAUDID) injection 0.5 mg  0.5 mg Intravenous Q6H PRN Kristopher Oppenheim, DO       labetalol (NORMODYNE) injection 10 mg  10 mg Intravenous Q4H PRN Kristopher Oppenheim, DO       LORazepam (ATIVAN) injection 0-4 mg  0-4 mg Intravenous Q6H Kristopher Oppenheim, DO       Or   LORazepam (ATIVAN) tablet 0-4 mg  0-4 mg Oral Q6H Kristopher Oppenheim, DO       [START ON 08/05/2021] LORazepam (ATIVAN) injection 0-4 mg  0-4 mg Intravenous Q12H Kristopher Oppenheim, DO       Or   Derrill Memo ON 08/05/2021] LORazepam (ATIVAN) tablet 0-4 mg  0-4 mg Oral Q12H Kristopher Oppenheim, DO       LORazepam (ATIVAN) tablet 1-4 mg  1-4 mg Oral Q1H PRN Kristopher Oppenheim, DO       Or   LORazepam (ATIVAN) injection 1-4 mg  1-4 mg Intravenous Q1H PRN Kristopher Oppenheim, DO       multivitamin with minerals tablet 1 tablet  1 tablet Oral Daily Kristopher Oppenheim, DO       ondansetron [facility]) injection 4 mg  4 mg Intravenous Q6H PRN Kristopher Oppenheim, DO       pantoprazole (PROTONIX) injection 40 mg  40 mg Intravenous Q12H Kristopher Oppenheim, DO       sodium chloride (PF) 0.9 % injection            thiamine tablet 100 mg  100 mg Oral Daily Kristopher Oppenheim, DO       Or   thiamine (B-1) injection 100 mg  100 mg Intravenous Daily Kristopher Oppenheim, DO        Allergies as of 08/02/2021   (No Known Allergies)    Family History  Problem Relation Age of Onset   Diabetes Mellitus II Neg Hx    Colon cancer Neg Hx    Stomach cancer Neg Hx    Pancreatic cancer Neg Hx     Social History   Socioeconomic History   Marital status:  Single  Spouse name: Not on file   Number of children: Not on file   Years of education: Not on file   Highest education level: Not on file  Occupational History   Not on file  Tobacco Use   Smoking status: Every Day    Packs/day: 1.00    Years: 30.00    Total pack years: 30.00    Types: Cigarettes   Smokeless tobacco: Never  Vaping Use   Vaping Use: Never used  Substance and Sexual Activity   Alcohol use: Yes   Drug use: Never   Sexual activity: Not Currently  Other Topics Concern   Not on file  Social History Narrative   Not on file   Social Determinants of Health   Financial Resource Strain: Not on file  Food Insecurity: Not on file  Transportation Needs: Not on file  Physical Activity: Not on file  Stress: Not on file  Social Connections: Not on file  Intimate Partner Violence: Not on file    Review of Systems: Gen: Denies fever, sweats or chills. No weight loss.  CV: Denies chest pain, palpitations or edema. Resp: Denies cough, shortness of breath of hemoptysis.  GI.  See HPI.  No dysphagia or heartburn. GU : Denies urinary burning, blood in urine, increased urinary frequency or incontinence. MS: Denies joint pain, muscles aches or weakness. Derm: Denies rash, itchiness, skin lesions or unhealing ulcers. Psych: + Anxiety and depression.  Heme: Denies easy bruising, bleeding. Neuro:  Denies headaches, dizziness or paresthesias. Endo:  Denies any problems with DM, thyroid or adrenal function.  Physical Exam: Vital signs in last 24 hours: Temp:  [97.9 F (36.6 C)-99.2 F (37.3 C)] 97.9 F (36.6 C) (06/09 0839) Pulse Rate:  [76-96] 79 (06/09 0839) Resp:  [15-20] 18 (06/09 0839) BP: (115-150)/(76-98) 141/88 (06/09 0839) SpO2:  [87 %-97 %] 93 % (06/09 0839) Weight:  [79.4 kg] 79.4 kg (06/08 2336)   General:  Alert 74-year-old male in no acute distress Head:  Normocephalic and atraumatic. Eyes:  No scleral icterus. Conjunctiva pink. Ears:  Normal  auditory acuity. Nose:  No deformity, discharge or lesions. Mouth:  Dentition intact. No ulcers or lesions.  Neck:  Supple. No lymphadenopathy or thyromegaly.  Lungs: Breath sounds clear throughout Heart: Regular rate and rhythm, no murmurs Abdomen: Soft, nondistended.  Nontender.  Positive bowel sounds all 4 quadrants. Rectal: Deferred. Musculoskeletal:  Symmetrical without gross deformities.  Pulses:  Normal pulses noted. Extremities:  Without clubbing or edema. Neurologic:  Alert and  oriented x 4. No focal deficits.  Skin:  Intact without significant lesions or rashes. Psych:  Alert and cooperative. Normal mood and affect.  Intake/Output from previous day: No intake/output data recorded. Intake/Output this shift: No intake/output data recorded.  Lab Results: Recent Labs    08/02/21 1004 08/02/21 1551 08/03/21 0039  WBC 3.8 3.6* 2.9*  HGB 7.5* 7.1* 7.1*  HCT 23.7* 23.9* 23.4*  PLT 470* 414* 344   BMET Recent Labs    08/02/21 1004 08/02/21 1551 08/03/21 0039  NA 139 139 141  K 4.1 3.2* 3.2*  CL 102 106 106  CO2 _0 GLUCOSE 78 89 86  BUN 6 <5* 6  CREATININE 0.55* 0.50* 0.51*  CALCIUM 9.2 8.3* 8.7*   LFT Recent Labs    08/03/21 0039  PROT 8.1  ALBUMIN 3.3*  AST 110*  ALT 34  ALKPHOS 93  BILITOT 0.7   PT/INR Recent Labs    08/02/21 1004  LABPROT  10.3  INR 1.0   Hepatitis Panel No results for input(s): "HEPBSAG", "HCVAB", "HEPAIGM", "HEPBIGM" in the last 72 hours.    Studies/Results: CT ABDOMEN PELVIS W CONTRAST  Result Date: 08/03/2021 CLINICAL DATA:  Pancreatitis, acute and severe EXAM: CT ABDOMEN AND PELVIS WITH CONTRAST TECHNIQUE: Multidetector CT imaging of the abdomen and pelvis was performed using the standard protocol following bolus administration of intravenous contrast. RADIATION DOSE REDUCTION: This exam was performed according to the departmental dose-optimization program which includes automated exposure control, adjustment of  the mA and/or kV according to patient size and/or use of iterative reconstruction technique. CONTRAST:  168m OMNIPAQUE IOHEXOL 300 MG/ML  SOLN COMPARISON:  05/06/2021 FINDINGS: Lower chest:  No contributory findings. Hepatobiliary: Prominent hepatic steatosis. No focal abnormality.Small calcified gallstone. No signs of cholecystitis Pancreas: Expanded and indistinct in margination. No necrosis or fluid collection. Small focus of adjacent portal venous thrombosis has developed since prior, eccentric and nonocclusive. Spleen: Unremarkable. Adrenals/Urinary Tract: Negative adrenals. No hydronephrosis or stone. Unremarkable bladder. Stomach/Bowel: Secondary appearing submucosal edema along the medial aspect of the second portion duodenum. No bowel obstruction. Vascular/Lymphatic: Portal venous findings noted above. Atheromatous calcification of the aorta. No mass or adenopathy. Reproductive:Enlargement of the central gland which projects into the bladder base. Other: No ascites or pneumoperitoneum. Umbilical hernia repair using mesh Musculoskeletal: No acute abnormalities. L4-5 and L5-S1 advanced disc degeneration. IMPRESSION: 1. Acute edematous pancreatitis. Small focus of adjacent portal venous thrombus which is nonocclusive. 2. Hepatic steatosis and cholelithiasis. Electronically Signed   By: JJorje GuildM.D.   On: 08/03/2021 04:25    IMPRESSION/PLAN:  162 56year old male with a history of H. Pylori gastritis (not treated), duodenal AVM,  UGI bleed with recurrent hematemesis, epistaxis and anemia. Hg 7.1.  No further hematemesis since admission.  Hemodynamically stable. -Clear liquid diet -Pantoprazole 40 mg IV twice daily -Consider EGD during this hospital admission if he does not exhibit any signs of significant alcohol withdrawal.  At this time, the patient is willing to undergo an EGD -Monitor H&H closely -Recommend PRBC transfusion for hemoglobin < 8. Note, patient previously refused blood  transfusions and IV iron -Eventual colonoscopy as an outpatient  2) Acute alcohol associated pancreatitis. Lipase 1,201 -> 557. CTAP identified acute edematous pancreatitis. -Continue IV fluid -Ondansetron 4 mg p.o. or IV every 6 hours as needed -Pain management per the hospitalist -Folate, thiamine and multivitamin daily  3) Alcohol use disorder with hepatic steatosis. Elevated Alk phos and AST levels.  CTAP consistent with hepatic steatosis without worrisome liver lesions. -Strongly encouraged alcohol abstinence -CIWA protocol in process  4) CTAP identified a small focus of adjacent portal venous thrombus which is nonocclusive. Unlikely candidate for long term anticoagulation secondary to noncompliance and homelessness status.  5) Cholelithiasis  Further recommendations per Dr. CChryl HeckMDorathy Daft 08/03/2021, 9:29 AM  I have taken a history, reviewed the chart and examined the patient. I performed a substantive portion of this encounter, including complete performance of at least one of the key components, in conjunction with the APP. I agree with the APP's note, impression and recommendations but would recommend transfusing for hemoglobin below 721  56year old male with alcoholism and history of alcohol induced pancreatitis, admitted with 1 episode of hematemesis and acute on chronic anemia.  He also has evidence of pancreatitis based on labs and imaging, but denies abdominal pain and is nontender on examination.  He has a small portal vein thrombosis, which is likely not symptomatic,  and most likely secondary to his pancreatitis. He has a history of upper GI bleeding from gastrointestinal AVMs and duodenal erosions.  No history of varices.  -Recommended we perform an upper endoscopy tomorrow to exclude high risk lesions and potentially ablate any AVMs that are contributing to his chronic anemia. - Okay with clear liquid diet today, continue twice daily Protonix IV - I  agree with decision not to anticoagulate portal vein thrombosis given bleeding risk and unclear benefit of anticoagulation  Kamile Fassler E. Candis Schatz, MD [facility] Gastroenterology

## 2021-08-03 NOTE — Anesthesia Preprocedure Evaluation (Signed)
Anesthesia Evaluation  Patient identified by MRN, date of birth, ID band Patient awake    Reviewed: Allergy & Precautions, NPO status , Patient's Chart, lab work & pertinent test results  History of Anesthesia Complications Negative for: history of anesthetic complications  Airway Mallampati: III  TM Distance: >3 FB Neck ROM: Full    Dental  (+) Poor Dentition   Pulmonary Current Smoker,    Pulmonary exam normal        Cardiovascular hypertension, Normal cardiovascular exam     Neuro/Psych Seizures -,  Anxiety Depression    GI/Hepatic (+)     substance abuse  alcohol use, Hepatitis - (alcoholic)Suspected UGIB Alcoholic pancreatitis   Endo/Other  negative endocrine ROS  Renal/GU negative Renal ROS  negative genitourinary   Musculoskeletal negative musculoskeletal ROS (+)   Abdominal   Peds  Hematology  (+) Blood dyscrasia, anemia ,   Anesthesia Other Findings   Reproductive/Obstetrics                            Anesthesia Physical Anesthesia Plan  ASA: 3  Anesthesia Plan: MAC   Post-op Pain Management: Minimal or no pain anticipated   Induction: Intravenous  PONV Risk Score and Plan: 1 and Propofol infusion, TIVA and Treatment may vary due to age or medical condition  Airway Management Planned: Natural Airway, Nasal Cannula and Simple Face Mask  Additional Equipment: None  Intra-op Plan:   Post-operative Plan:   Informed Consent: I have reviewed the patients History and Physical, chart, labs and discussed the procedure including the risks, benefits and alternatives for the proposed anesthesia with the patient or authorized representative who has indicated his/her understanding and acceptance.       Plan Discussed with:   Anesthesia Plan Comments:        Anesthesia Quick Evaluation

## 2021-08-03 NOTE — ED Notes (Signed)
Pt returned from CT °

## 2021-08-03 NOTE — TOC Initial Note (Signed)
Transition of Care [facility]) - Initial/Assessment Note    Patient Details  Name: Samuel Blevins MRN: [id number] Date of Birth: 10/21/1965  Transition of Care [facility]) CM/SW Contact:    Golda Acre, RN Phone Number: 08/03/2021, 9:22 AM  Clinical Narrative:                  Transition of Care [facility]) Screening Note   Patient Details  Name: Samuel Blevins Date of Birth: 06/03/1965   Transition of Care [facility]) CM/SW Contact:    Golda Acre, RN Phone Number: 08/03/2021, 9:22 AM    Transition of Care Department [facility]) has reviewed patient and no TOC needs have been identified at this time. We will continue to monitor patient advancement through interdisciplinary progression rounds. If new patient transition needs arise, please place a TOC consult.    Expected Discharge Plan: Homeless Shelter Barriers to Discharge: Continued Medical Work up   Patient Goals and CMS Choice Patient states their goals for this hospitalization and ongoing recovery are:: to leaVE HERE CMS Medicare.gov Compare Post Acute Care list provided to:: Patient    Expected Discharge Plan and Services Expected Discharge Plan: Homeless Shelter   Discharge Planning Services: CM Consult   Living arrangements for the past 2 months: Homeless Shelter                                      Prior Living Arrangements/Services Living arrangements for the past 2 months: Homeless Shelter Lives with:: Self Patient language and need for interpreter reviewed:: Yes Do you feel safe going back to the place where you live?: Yes            Criminal Activity/Legal Involvement Pertinent to Current Situation/Hospitalization: No - Comment as needed  Activities of Daily Living      Permission Sought/Granted                  Emotional Assessment Appearance:: Appears stated age     Orientation: : Oriented to Self, Oriented to Place, Oriented to  Time, Oriented to Situation Alcohol / Substance Use: Not  Applicable Psych Involvement: No (comment)  Admission diagnosis:  Portal vein thrombosis [I81] Alcoholic pancreatitis [K85.20] Acute pancreatitis, unspecified complication status, unspecified pancreatitis type [K85.90] Acute pancreatitis [K85.90] Patient Active Problem List   Diagnosis Date Noted   Portal vein thrombosis 08/03/2021   Alcoholic pancreatitis 08/03/2021   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   Anxiety and depression 07/04/2021   Homeless 07/04/2021   Osteoarthritis 07/04/2021   GI bleeding 05/08/2021   Pancreatic insufficiency 05/08/2021   Chronic alcohol abuse 05/07/2021   Pancreatitis 12/07/2020   Alcoholic steatohepatitis 12/07/2020   Iron deficiency anemia due to chronic blood loss 12/07/2020   Seizure (HCC) 12/04/2020   Acute pancreatitis 09/16/2020   Alcohol withdrawal (HCC) 09/16/2020   Chronic pain syndrome 12/28/2019   Pancreatic pseudocyst 10/28/2019   Pancreatitis, acute 10/28/2019   Gastritis and gastroduodenitis    AVM (arteriovenous malformation) of small bowel, acquired    Alcohol dependence syndrome (HCC) 06/21/2019   Chronic anemia 11/23/2015   Tobacco abuse 03/17/2014   Essential hypertension 03/17/2014   PCP:  [name], MD Pharmacy:   [facility] [address] [place] - [address] [place] [address] Phone: [phone] Fax: [phone]  [facility] [address] [place] [address] Phone: [phone] Fax: [phone]  [facility] [address] [place] [address] Phone: [phone] Fax: [phone]  [facility] [address] [place] [address] Phone: [phone] Fax: [phone]     Social Determinants of Health (SDOH) Interventions    Readmission Risk Interventions    06/22/2019    3:29 PM  Readmission Risk  Prevention Plan  Post Dischage Appt Complete  Medication Screening Complete  Transportation Screening Complete

## 2021-08-03 NOTE — Progress Notes (Signed)
Subjective: Patient admitted this morning, see detailed H&P by Dr. 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  He was diagnosed with alcoholic pancreatitis.  At that time he left AMA.  He returned yesterday with similar complaints.  Continue to use alcohol.  CT abdomen pelvis showed acute pancreatitis.  Small bowel stone.  Small area of portal vein thrombosis which was nonocclusive. Patient started on CIWA protocol Lipase was initially elevated 1201 on 08/02/2021; improved to 487 this morning He also had 1 episode of vomiting blood yesterday  Vitals:   08/03/21 0839 08/03/21 1228  BP: (!) 141/88 (!) 153/79  Pulse:  66  Resp: 18 18  Temp: 97.9 F (36.6 C) 98.4 F (36.9 C)  SpO2: 93% 100%      A/P Alcoholic pancreatitis-continue to keep n.p.o., aggressive IV hydration with D5 LR at 125 mill per hour.  Continue Dilaudid 0.5 mg IV every 6 hours as needed/oxycodone 5 mg every 6 hours as needed  Hematemesis-had 1 episode of hematemesis, will consult GI for further evaluation  Portal vein thrombosis-noted incidentally on CT abdomen/pelvis.  Given patient's history for alcohol abuse, chronic homelessness and lack of medical follow-up and the risk of putting him on anticoagulation is more than any benefit.  Pancreatic insufficiency-Will need Creon once patient able to take by mouth  AVM of small bowel -GI consulted  Alcohol dependence -Started on CIWA protocol  Chronic anemia -Hemoglobin at baseline  Hypokalemia -Potassium is 3.3 -Replace potassium and follow BMP in am       Meredeth Ide [facility]

## 2021-08-03 NOTE — Assessment & Plan Note (Signed)
Chronic. Will need creon when starting po meds. Keep NPO while having abd pain.

## 2021-08-03 NOTE — Assessment & Plan Note (Signed)
Chronic. HgB at baseline.

## 2021-08-03 NOTE — Assessment & Plan Note (Addendum)
Pt with long hx of alcohol abuse. Start CIWA protocol.

## 2021-08-03 NOTE — Progress Notes (Signed)
I [facility] and saw him yesterday AM at my community care clinic.   Thank you for admitting this unfortunate patient.   Patient has had daily emesis with blood.  Can I request a GI consult for possible EGD while inpatient.  The patient will get anxious and try to sign out AMA, please try to work with him in this regard and thank you so much for your excellent care.  Shan Levans MD Medical Director [facility] Cell [phone]

## 2021-08-03 NOTE — Subjective & Objective (Signed)
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  He was seen yesterday in the ER.  Diagnosed with alcoholic pancreatitis.  He left AMA.  He returns today with similar complaints of abdominal pain.  He continues to abuse alcohol.  CT imaging today demonstrated acute pancreatitis.  There is a small gallstone.  Prominent hepatic steatosis.  There is a small area of portal venous thrombosis.  This was nonocclusive.  Patient started on CIWA protocol.  Patient initially stated he wanted something to eat and would refuse to be n.p.o.  ED provider went to go talk to him about signing out against medical advice.  Patient recanted and stated that he would remain n.p.o. as recommended.  He desires to be admitted.

## 2021-08-04 ENCOUNTER — Inpatient Hospital Stay (HOSPITAL_COMMUNITY): Payer: 59 | Admitting: Anesthesiology

## 2021-08-04 ENCOUNTER — Encounter (HOSPITAL_COMMUNITY): Payer: Self-pay | Admitting: Family Medicine

## 2021-08-04 ENCOUNTER — Encounter (HOSPITAL_COMMUNITY)
Admission: EM | Disposition: A | Discharge status: Home / Self Care | Payer: Self-pay | Source: Home / Self Care | Attending: Family Medicine

## 2021-08-04 DIAGNOSIS — D649 Anemia, unspecified: Secondary | ICD-10-CM | POA: Diagnosis not present

## 2021-08-04 DIAGNOSIS — F418 Other specified anxiety disorders: Secondary | ICD-10-CM

## 2021-08-04 DIAGNOSIS — K859 Acute pancreatitis without necrosis or infection, unspecified: Secondary | ICD-10-CM | POA: Diagnosis not present

## 2021-08-04 DIAGNOSIS — I10 Essential (primary) hypertension: Secondary | ICD-10-CM

## 2021-08-04 DIAGNOSIS — K317 Polyp of stomach and duodenum: Secondary | ICD-10-CM

## 2021-08-04 DIAGNOSIS — K449 Diaphragmatic hernia without obstruction or gangrene: Secondary | ICD-10-CM

## 2021-08-04 DIAGNOSIS — F17210 Nicotine dependence, cigarettes, uncomplicated: Secondary | ICD-10-CM

## 2021-08-04 DIAGNOSIS — K920 Hematemesis: Secondary | ICD-10-CM | POA: Diagnosis not present

## 2021-08-04 DIAGNOSIS — K852 Alcohol induced acute pancreatitis without necrosis or infection: Secondary | ICD-10-CM | POA: Diagnosis not present

## 2021-08-04 HISTORY — PX: BIOPSY: SHX5522

## 2021-08-04 HISTORY — PX: ESOPHAGOGASTRODUODENOSCOPY: SHX5428

## 2021-08-04 LAB — COMPREHENSIVE METABOLIC PANEL
ALT: 30 U/L (ref 0–44)
AST: 84 U/L — ABNORMAL HIGH (ref 15–41)
Albumin: 3.4 g/dL — ABNORMAL LOW (ref 3.5–5.0)
Alkaline Phosphatase: 103 U/L (ref 38–126)
Anion gap: 10 (ref 5–15)
BUN: <5 mg/dL — ABNORMAL LOW (ref 6–20)
CO2: 26 mmol/L (ref 22–32)
Calcium: 9.2 mg/dL (ref 8.9–10.3)
Chloride: 99 mmol/L (ref 98–111)
Creatinine, Ser: 0.56 mg/dL — ABNORMAL LOW (ref 0.61–1.24)
GFR, Estimated: >60 mL/min (ref >60)
Glucose, Bld: 105 mg/dL — ABNORMAL HIGH (ref 70–99)
Potassium: 3.2 mmol/L — ABNORMAL LOW (ref 3.5–5.1)
Sodium: 135 mmol/L (ref 135–145)
Total Bilirubin: 1.7 mg/dL — ABNORMAL HIGH (ref 0.3–1.2)
Total Protein: 8.3 g/dL — ABNORMAL HIGH (ref 6.5–8.1)

## 2021-08-04 LAB — CBC WITH DIFFERENTIAL/PLATELET
Abs Immature Granulocytes: 0.03 10*3/uL (ref 0.00–0.07)
Basophils Absolute: 0 10*3/uL (ref 0.0–0.1)
Basophils Relative: 1 %
Eosinophils Absolute: 0 10*3/uL (ref 0.0–0.5)
Eosinophils Relative: 1 %
HCT: 25.9 % — ABNORMAL LOW (ref 39.0–52.0)
Hemoglobin: 7.8 g/dL — ABNORMAL LOW (ref 13.0–17.0)
Immature Granulocytes: 1 %
Lymphocytes Relative: 24 %
Lymphs Abs: 0.9 10*3/uL (ref 0.7–4.0)
MCH: 23.6 pg — ABNORMAL LOW (ref 26.0–34.0)
MCHC: 30.1 g/dL (ref 30.0–36.0)
MCV: 78.5 fL — ABNORMAL LOW (ref 80.0–100.0)
Monocytes Absolute: 0.4 10*3/uL (ref 0.1–1.0)
Monocytes Relative: 11 %
Neutro Abs: 2.3 10*3/uL (ref 1.7–7.7)
Neutrophils Relative %: 62 %
Platelet Morphology: NORMAL
Platelets: 326 10*3/uL (ref 150–400)
RBC: 3.3 MIL/uL — ABNORMAL LOW (ref 4.22–5.81)
RDW: 25.4 % — ABNORMAL HIGH (ref 11.5–15.5)
WBC: 3.7 10*3/uL — ABNORMAL LOW (ref 4.0–10.5)
nRBC: 0 % (ref 0.0–0.2)

## 2021-08-04 LAB — MAGNESIUM: Magnesium: 1 mg/dL — ABNORMAL LOW (ref 1.7–2.4)

## 2021-08-04 LAB — LIPASE, BLOOD: Lipase: 350 U/L — ABNORMAL HIGH (ref 11–51)

## 2021-08-04 SURGERY — EGD (ESOPHAGOGASTRODUODENOSCOPY)
Anesthesia: Monitor Anesthesia Care

## 2021-08-04 MED ORDER — POTASSIUM CHLORIDE CRYS ER 20 MEQ PO TBCR
40.0000 meq | EXTENDED_RELEASE_TABLET | ORAL | Status: DC
  Administered 2021-08-04: 40 meq via ORAL
  Filled 2021-08-04: qty 2

## 2021-08-04 MED ORDER — PROPOFOL 10 MG/ML IV BOLUS
INTRAVENOUS | Status: DC | PRN
  Administered 2021-08-04 (×3): 30 mg via INTRAVENOUS

## 2021-08-04 MED ORDER — LACTATED RINGERS IV SOLN: INTRAVENOUS | Status: DC | PRN

## 2021-08-04 MED ORDER — PROPOFOL 500 MG/50ML IV EMUL
INTRAVENOUS | Status: DC | PRN
  Administered 2021-08-04: 125 ug/kg/min via INTRAVENOUS

## 2021-08-04 MED ORDER — MAGNESIUM SULFATE 4 GM/100ML IV SOLN
4.0000 g | Freq: Once | INTRAVENOUS | Status: AC
  Administered 2021-08-04: 4 g via INTRAVENOUS
  Filled 2021-08-04 (×2): qty 100

## 2021-08-04 MED ORDER — PROPOFOL 1000 MG/100ML IV EMUL
INTRAVENOUS | Status: AC
  Filled 2021-08-04: qty 100

## 2021-08-04 NOTE — Plan of Care (Signed)
Pt is leaving against medical advice. Md aware of pt choice to leave AMA.

## 2021-08-04 NOTE — Progress Notes (Signed)
Pt stated he would not stay to get labs redrawn or take his second dose of potassium. Md notified and stated he could not d/c pt, that he would have to sign out against medical advice if he wanted to leave. Pt states he will be leaving after he eats his lunch. Pt overall stable though tremors and anxiety remain. Rn will continue to monitor. Rn explained to pt risk of leaving Against medical advice including death, heart issues, and more. He understood and wants to leave anyway.

## 2021-08-04 NOTE — Discharge Summary (Signed)
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  He was diagnosed with alcoholic pancreatitis.  At that time he left AMA.  He returned yesterday with similar complaints.  Continue to use alcohol.  CT abdomen pelvis showed acute pancreatitis.   Small area of portal vein thrombosis which was nonocclusive. Patient started on CIWA protocol Lipase was initially elevated 1201 on 08/02/2021; improved to 487  Patient underwent EGD this morning which was essentially normal.  GI recommended to discharge home.  However patient's labs this morning showed magnesium of 1.0 and potassium was still 3.2.  I recommended patient to get magnesium and potassium replaced before discharge.  Initially he was hesitant.  Then he agreed to stay, he was given 4 g of magnesium sulfate IV x1 and K. Dur 40 meq p.o. x1.  He was supposed to get another dose of K-Dur 40 mEq, and get repeat lab for magnesium and potassium, however patient did not want to get another dose and left AGAINST MEDICAL ADVICE.

## 2021-08-04 NOTE — Op Note (Signed)
[facility] Patient Name: Samuel HockeyRobert Julius Procedure Date: 08/04/2021 MRN: [id number] Attending MD: Dub AmisScott E. Tomasa Randunningham , MD Date of Birth: [birth year] CSN: [id number] Age: [age] Admit Type: Outpatient Procedure:                Upper GI endoscopy Indications:              Hematemesis Providers:                Lorin PicketScott E. Tomasa Randunningham, MD, Janae SauceKaren D. Steele BergHinson, RN,                            Champ MungoJustina Lawrence, Technician, Nadene Rubinson Cochran Referring MD:              Medicines:                Monitored Anesthesia Care Complications:            No immediate complications. Estimated Blood Loss:     Estimated blood loss was minimal. Procedure:                Pre-Anesthesia Assessment:                           - Prior to the procedure, a History and Physical                            was performed, and patient medications and                            allergies were reviewed. The patient's tolerance of                            previous anesthesia was also reviewed. The risks                            and benefits of the procedure and the sedation                            options and risks were discussed with the patient.                            All questions were answered, and informed consent                            was obtained. Prior Anticoagulants: The patient has                            taken no previous anticoagulant or antiplatelet                            agents. ASA Grade Assessment: II - A patient with                            mild systemic disease. After reviewing the risks  and benefits, the patient was deemed in                            satisfactory condition to undergo the procedure.                           After obtaining informed consent, the endoscope was                            passed under direct vision. Throughout the                            procedure, the patient's blood pressure, pulse, and                             oxygen saturations were monitored continuously. The                            [id number]-H190 ([id number]) Olympus endoscope was introduced                            through the mouth, and advanced to the third part                            of duodenum. The upper GI endoscopy was                            accomplished without difficulty. The patient                            tolerated the procedure well. Scope In: Scope Out: Findings:      The examined esophagus was normal.      A 3 cm hiatal hernia was present.      The entire examined stomach was normal. Biopsies were taken with a cold       forceps for Helicobacter pylori testing. Estimated blood loss was       minimal.      A single 15 mm flat polyp vs mucosal irregularity was found in the third       portion of the duodenum. Biopsies were taken with a cold forceps for       histology. Estimated blood loss was minimal.      The exam of the duodenum was otherwise normal. Impression:               - Normal esophagus.                           - 3 cm hiatal hernia.                           - Normal stomach. Biopsied.                           - A single possible duodenal polyp. Biopsied.                           -  No upper GI bleeding source identified. Patient's                            bloody vomit may have been from pharyngeal source.                           - Patient's hemoglobin has been stable without                            evidence of further bleeding Moderate Sedation:      Not Applicable - Patient had care per Anesthesia. Recommendation:           - Return patient to [facility] for possible                            discharge same day.                           - Resume previous diet.                           - Continue present medications.                           - Await pathology results.                           - Recommend outpatient colonoscopy to further                            evaluate chronic  anemia                           - If the biopsies of the suspected are adenomatous,                            patient will need EMR of the polyp as an outpatient.                           - Recommend complete alcohol cessation                           - Follow up with Dr. Barron Alvine in the GI clinic to                            discuss colonoscopy. Procedure Code(s):        --- Professional ---                           [phone], Esophagogastroduodenoscopy, flexible,                            transoral; with biopsy, single or multiple Diagnosis Code(s):        --- Professional ---  K44.9, Diaphragmatic hernia without obstruction or                            gangrene                           K31.7, Polyp of stomach and duodenum                           K92.0, Hematemesis CPT copyright 2019 American Medical Association. All rights reserved. The codes documented in this report are preliminary and upon coder review may  be revised to meet current compliance requirements. Jaleal Schliep E. Tomasa Rand, MD 08/04/2021 8:33:58 AM This report has been signed electronically. Number of Addenda: 0

## 2021-08-04 NOTE — Interval H&P Note (Signed)
History and Physical Interval Note:  08/04/2021 7:56 AM  Samuel [facility]  has presented today for surgery, with the diagnosis of Hematemesis, anemia.  The various methods of treatment have been discussed with the patient and family. After consideration of risks, benefits and other options for treatment, the patient has consented to  Procedure(s): ESOPHAGOGASTRODUODENOSCOPY (EGD) (N/A) as a surgical intervention.  The patient's history has been reviewed, patient examined, no change in status, stable for surgery.  I have reviewed the patient's chart and labs.  Questions were answered to the patient's satisfaction.     Jenel Lucks

## 2021-08-04 NOTE — Plan of Care (Signed)
  Problem: Clinical Measurements: Goal: Respiratory complications will improve Outcome: Progressing   Problem: Clinical Measurements: Goal: Cardiovascular complication will be avoided Outcome: Progressing   Problem: Nutrition: Goal: Adequate nutrition will be maintained Outcome: Progressing   Problem: Elimination: Goal: Will not experience complications related to bowel motility Outcome: Progressing   Problem: Activity: Goal: Risk for activity intolerance will decrease Outcome: Progressing   Problem: Safety: Goal: Ability to remain free from injury will improve Outcome: Progressing

## 2021-08-04 NOTE — Progress Notes (Signed)
Pt back from endo in stable condition. No needs at time of return to room. He wants to leave against medical advice before seeing the md. Md notified of this via secure chat. No signs of distress in room.

## 2021-08-04 NOTE — Progress Notes (Signed)
Dr. Sharl Ma to room to see pt. Pt has agreed to stay and get his magnesium and potassium replaced. He refuses to have tele box put back in place. Md aware of this. Rn will continue to monitor.

## 2021-08-04 NOTE — Anesthesia Postprocedure Evaluation (Signed)
Anesthesia Post Note  Patient: Samuel [facility]  Procedure(s) Performed: ESOPHAGOGASTRODUODENOSCOPY (EGD) BIOPSY     Patient location during evaluation: Endoscopy Anesthesia Type: MAC Level of consciousness: awake and alert Pain management: pain level controlled Vital Signs Assessment: post-procedure vital signs reviewed and stable Respiratory status: spontaneous breathing, nonlabored ventilation and respiratory function stable Cardiovascular status: blood pressure returned to baseline and stable Postop Assessment: no apparent nausea or vomiting Anesthetic complications: no   No notable events documented.  Last Vitals:  Vitals:   08/04/21 0840 08/04/21 0845  BP: (!) 148/86   Pulse: 97 96  Resp: 19 17  Temp:    SpO2: 99% 100%    Last Pain:  Vitals:   08/04/21 0839  TempSrc:   PainSc: 0-No pain                 Lidia Collum

## 2021-08-04 NOTE — Transfer of Care (Signed)
Immediate Anesthesia Transfer of Care Note  Patient: Elison Worrel  Procedure(s) Performed: ESOPHAGOGASTRODUODENOSCOPY (EGD) BIOPSY  Patient Location: Endoscopy Unit  Anesthesia Type:MAC  Level of Consciousness: awake and patient cooperative  Airway & Oxygen Therapy: Patient Spontanous Breathing and Patient connected to face mask  Post-op Assessment: Report given to RN and Post -op Vital signs reviewed and stable  Post vital signs: Reviewed and stable  Last Vitals:  Vitals Value Taken Time  BP 138/78 08/04/21 0830  Temp    Pulse 106 08/04/21 0830  Resp 21 08/04/21 0830  SpO2 100 % 08/04/21 0830  Vitals shown include unvalidated device data.  Last Pain:  Vitals:   08/04/21 0829  TempSrc:   PainSc: 0-No pain      Patients Stated Pain Goal: 0 (85/88/50 2774)  Complications: No notable events documented.

## 2021-08-04 NOTE — Progress Notes (Signed)
Pt down to endo in stable condition.

## 2021-08-04 NOTE — Anesthesia Procedure Notes (Signed)
Procedure Name: MAC Date/Time: 08/04/2021 8:10 AM  Performed by: Claudia Desanctis, CRNAPre-anesthesia Checklist: Patient identified, Emergency Drugs available, Suction available and Patient being monitored Patient Re-evaluated:Patient Re-evaluated prior to induction Oxygen Delivery Method: Simple face mask

## 2021-08-04 NOTE — Plan of Care (Signed)
  Problem: Clinical Measurements: Goal: Will remain free from infection 08/04/2021 0825 by Sherren Kerns, RN Outcome: Progressing 08/04/2021 0731 by Sherren Kerns, RN Outcome: Progressing   Problem: Clinical Measurements: Goal: Diagnostic test results will improve 08/04/2021 0825 by Sherren Kerns, RN Outcome: Progressing 08/04/2021 0731 by Sherren Kerns, RN Outcome: Progressing   Problem: Clinical Measurements: Goal: Respiratory complications will improve 08/04/2021 0825 by Sherren Kerns, RN Outcome: Progressing 08/04/2021 0731 by Sherren Kerns, RN Outcome: Progressing   Problem: Nutrition: Goal: Adequate nutrition will be maintained 08/04/2021 0825 by Sherren Kerns, RN Outcome: Progressing 08/04/2021 0731 by Sherren Kerns, RN Outcome: Progressing   Problem: Coping: Goal: Level of anxiety will decrease 08/04/2021 0825 by Sherren Kerns, RN Outcome: Progressing 08/04/2021 0731 by Sherren Kerns, RN Outcome: Progressing   Problem: Pain Managment: Goal: General experience of comfort will improve 08/04/2021 0825 by Sherren Kerns, RN Outcome: Progressing 08/04/2021 0731 by Sherren Kerns, RN Outcome: Progressing

## 2021-08-04 NOTE — Progress Notes (Signed)
Md notified that pt is refusing this second dose of potassium and that he wants to leave after he eats his lunch. Pt remains stable. Rn will continue to monitor.

## 2021-08-06 ENCOUNTER — Telehealth: Payer: Self-pay

## 2021-08-06 ENCOUNTER — Encounter (HOSPITAL_COMMUNITY): Payer: Self-pay | Admitting: Gastroenterology

## 2021-08-06 NOTE — Telephone Encounter (Signed)
Transition Care Management Unsuccessful Follow-up Telephone Call  Date of discharge and from where:  08/04/2021, [facility]- left AMA  Attempts:  1st Attempt  Reason for unsuccessful TCM follow-up call:  Unable to reach patient- # [phone], just rings, no option to leave a message.  Patient has been staying at [facility]

## 2021-08-07 ENCOUNTER — Telehealth: Payer: Self-pay

## 2021-08-07 NOTE — Telephone Encounter (Signed)
Transition Care Management Unsuccessful Follow-up Telephone Call  Date of discharge and from where:  08/04/2021, [facility]  Attempts:  2nd Attempt  Reason for unsuccessful TCM follow-up call:  Unable to reach patient # [phone], just rings, then the recording states that the call cannot e completed at this time   no voicemail option.  Patient has been staying at [facility]. Message sent to Marin Roberts, RN/[facility] inquiring about scheduling a hospital follow up appointment for patients.

## 2021-08-08 ENCOUNTER — Telehealth: Payer: Self-pay

## 2021-08-08 ENCOUNTER — Other Ambulatory Visit (HOSPITAL_COMMUNITY): Payer: Self-pay

## 2021-08-08 ENCOUNTER — Other Ambulatory Visit: Payer: Self-pay | Admitting: Critical Care Medicine

## 2021-08-08 ENCOUNTER — Other Ambulatory Visit: Payer: 59

## 2021-08-08 ENCOUNTER — Encounter: Payer: Self-pay | Admitting: Physician Assistant

## 2021-08-08 DIAGNOSIS — D500 Iron deficiency anemia secondary to blood loss (chronic): Secondary | ICD-10-CM

## 2021-08-08 DIAGNOSIS — I10 Essential (primary) hypertension: Secondary | ICD-10-CM

## 2021-08-08 DIAGNOSIS — K852 Alcohol induced acute pancreatitis without necrosis or infection: Secondary | ICD-10-CM

## 2021-08-08 DIAGNOSIS — K2970 Gastritis, unspecified, without bleeding: Secondary | ICD-10-CM

## 2021-08-08 DIAGNOSIS — K7010 Alcoholic hepatitis without ascites: Secondary | ICD-10-CM

## 2021-08-08 LAB — SURGICAL PATHOLOGY

## 2021-08-08 MED ORDER — SALINE SPRAY 0.65 % NA SOLN
2.0000 | NASAL | 2 refills | Status: DC
  Filled 2021-08-08: qty 30, 60d supply, fill #0

## 2021-08-08 MED ORDER — THIAMINE HCL 100 MG PO TABS
100.0000 mg | ORAL_TABLET | Freq: Every day | ORAL | 0 refills | Status: DC
  Filled 2021-08-08 (×2): qty 100, 100d supply, fill #0

## 2021-08-08 MED ORDER — PANCRELIPASE (LIP-PROT-AMYL) 36000-114000 UNITS PO CPEP
36000.0000 [IU] | ORAL_CAPSULE | Freq: Three times a day (TID) | ORAL | 3 refills | Status: DC
  Filled 2021-08-08: qty 90, 30d supply, fill #0
  Filled 2021-08-08: qty 180, 60d supply, fill #0

## 2021-08-08 MED ORDER — LORAZEPAM 0.5 MG PO TABS
ORAL_TABLET | ORAL | 0 refills | Status: DC
  Filled 2021-08-08: qty 40, 4d supply, fill #0

## 2021-08-08 MED ORDER — PANTOPRAZOLE SODIUM 40 MG PO TBEC
40.0000 mg | DELAYED_RELEASE_TABLET | Freq: Two times a day (BID) | ORAL | 0 refills | Status: DC
  Filled 2021-08-08 (×2): qty 60, 30d supply, fill #0

## 2021-08-08 MED ORDER — SUCRALFATE 1 G PO TABS
1.0000 g | ORAL_TABLET | Freq: Three times a day (TID) | ORAL | 0 refills | Status: DC
  Filled 2021-08-08 (×2): qty 120, 30d supply, fill #0

## 2021-08-08 MED ORDER — FOLIC ACID 1 MG PO TABS
1.0000 mg | ORAL_TABLET | Freq: Every day | ORAL | 2 refills | Status: DC
  Filled 2021-08-08 (×2): qty 60, 60d supply, fill #0

## 2021-08-08 MED ORDER — IRON (FERROUS SULFATE) 325 (65 FE) MG PO TABS
325.0000 mg | ORAL_TABLET | Freq: Every day | ORAL | 2 refills | Status: DC
  Filled 2021-08-08: qty 100, 100d supply, fill #0
  Filled 2021-08-08: qty 30, 30d supply, fill #0

## 2021-08-08 NOTE — Telephone Encounter (Signed)
Transition Care Management Unsuccessful Follow-up Telephone Call  Date of discharge and from where:  08/04/2021, [facility]- left AMA   Attempts:  3rd Attempt  Reason for unsuccessful TCM follow-up call:  Unable to reach patient [phone], just rings, no voicemail option.   Patient has been staying at [facility]. Message was sent to Marin Roberts, RN/[facility] inquiring about scheduling a hospital follow up appointment for patient.
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
## 2021-08-09 ENCOUNTER — Other Ambulatory Visit: Payer: Self-pay | Admitting: Critical Care Medicine

## 2021-08-09 ENCOUNTER — Ambulatory Visit: Payer: 59 | Attending: Critical Care Medicine

## 2021-08-09 ENCOUNTER — Other Ambulatory Visit: Payer: Self-pay

## 2021-08-09 DIAGNOSIS — K2970 Gastritis, unspecified, without bleeding: Secondary | ICD-10-CM

## 2021-08-09 DIAGNOSIS — K863 Pseudocyst of pancreas: Secondary | ICD-10-CM

## 2021-08-09 DIAGNOSIS — D500 Iron deficiency anemia secondary to blood loss (chronic): Secondary | ICD-10-CM

## 2021-08-09 DIAGNOSIS — I81 Portal vein thrombosis: Secondary | ICD-10-CM

## 2021-08-09 DIAGNOSIS — K852 Alcohol induced acute pancreatitis without necrosis or infection: Secondary | ICD-10-CM

## 2021-08-10 LAB — COMPREHENSIVE METABOLIC PANEL
ALT: 50 IU/L — ABNORMAL HIGH (ref 0–44)
AST: 196 IU/L — ABNORMAL HIGH (ref 0–40)
Albumin/Globulin Ratio: 1.1 — ABNORMAL LOW (ref 1.2–2.2)
Albumin: 4 g/dL (ref 3.8–4.9)
Alkaline Phosphatase: 110 IU/L (ref 44–121)
BUN/Creatinine Ratio: 9 (ref 9–20)
BUN: 6 mg/dL (ref 6–24)
Bilirubin Total: 0.5 mg/dL (ref 0.0–1.2)
CO2: 18 mmol/L — ABNORMAL LOW (ref 20–29)
Calcium: 8.8 mg/dL (ref 8.7–10.2)
Chloride: 103 mmol/L (ref 96–106)
Creatinine, Ser: 0.66 mg/dL — ABNORMAL LOW (ref 0.76–1.27)
Globulin, Total: 3.8 g/dL (ref 1.5–4.5)
Glucose: 124 mg/dL — ABNORMAL HIGH (ref 70–99)
Potassium: 3.6 mmol/L (ref 3.5–5.2)
Sodium: 140 mmol/L (ref 134–144)
Total Protein: 7.8 g/dL (ref 6.0–8.5)
eGFR: 109 mL/min/{1.73_m2} (ref >59)

## 2021-08-10 LAB — CBC WITH DIFFERENTIAL/PLATELET
Basophils Absolute: 0 10*3/uL (ref 0.0–0.2)
Basos: 1 %
EOS (ABSOLUTE): 0.1 10*3/uL (ref 0.0–0.4)
Eos: 3 %
Hematocrit: 25.4 % — ABNORMAL LOW (ref 37.5–51.0)
Hemoglobin: 7.7 g/dL — ABNORMAL LOW (ref 13.0–17.7)
Immature Grans (Abs): 0 10*3/uL (ref 0.0–0.1)
Immature Granulocytes: 0 %
Lymphocytes Absolute: 1.3 10*3/uL (ref 0.7–3.1)
Lymphs: 46 %
MCH: 24.3 pg — ABNORMAL LOW (ref 26.6–33.0)
MCHC: 30.3 g/dL — ABNORMAL LOW (ref 31.5–35.7)
MCV: 80 fL (ref 79–97)
Monocytes Absolute: 0.4 10*3/uL (ref 0.1–0.9)
Monocytes: 15 %
Neutrophils Absolute: 1 10*3/uL — ABNORMAL LOW (ref 1.4–7.0)
Neutrophils: 35 %
Platelets: 228 10*3/uL (ref 150–450)
RBC: 3.17 x10E6/uL — ABNORMAL LOW (ref 4.14–5.80)
RDW: 21.3 % — ABNORMAL HIGH (ref 11.6–15.4)
WBC: 2.9 10*3/uL — ABNORMAL LOW (ref 3.4–10.8)

## 2021-08-10 LAB — MAGNESIUM: Magnesium: 1.6 mg/dL (ref 1.6–2.3)

## 2021-08-10 LAB — LIPASE: Lipase: 900 U/L — ABNORMAL HIGH (ref 13–78)

## 2021-08-10 NOTE — Progress Notes (Signed)
Pt aware of results   no med changes

## 2021-08-14 ENCOUNTER — Encounter: Payer: Self-pay | Admitting: Critical Care Medicine

## 2021-08-15 ENCOUNTER — Telehealth: Payer: Self-pay | Admitting: Critical Care Medicine

## 2021-08-15 ENCOUNTER — Other Ambulatory Visit: Payer: Self-pay | Admitting: Family Medicine

## 2021-08-15 ENCOUNTER — Encounter: Payer: Self-pay | Admitting: Physician Assistant

## 2021-08-15 DIAGNOSIS — M5416 Radiculopathy, lumbar region: Secondary | ICD-10-CM

## 2021-08-15 MED ORDER — GABAPENTIN 100 MG PO CAPS
100.0000 mg | ORAL_CAPSULE | Freq: Three times a day (TID) | ORAL | 0 refills | Status: DC
  Filled 2021-08-15: qty 90, 30d supply, fill #0

## 2021-08-15 NOTE — Progress Notes (Addendum)
56 yo with etoh abuse, HTN, hx GI bleeding, etc presenting with pain in L hip, pelvis, and back.  Seems to be getting worse.  Chronic for months.  Drinking seems to make the pain in his L hip and back worse.  Has had numbness in toes for weeks to months.  Notes was walking without assistive device 5-6 weeks ago, then went to needing a cane, and now a walker.  Notes urinary incontinence over past several weeks as well.  When asked about saddle anesthesia, notes maybe x4 weeks?  Vitals:   08/15/21 1552  BP: (!) 143/82  Pulse: (!) 110  SpO2: 96%   Exam notable for 4/5 strength to bilateral lower extremities (dorsiflexion, plantar flexion, leg extension, hip flexion).  + straight leg raise bilaterally, barely lifted L leg before he complained of discomfort.  C/o midline tenderness to palpation of entire spine.  Tender to palpation of bilateral buttocks.    Discussed him going to ED for urgent spine imaging with concerning progressive symptoms.  He wasn't interested in going tonight, but said he would tmrw.  I spoke to nsgy on call about him and they thought we could pursue outpatient MRI.  Will work on arranging imaging with Dr. [name] Will prescribe gabapentin in the meantime   Lacretia Nicks, MD  ---------------------------------------------------------  Pt seen by Dr Lowell Guitar.  He is having pain in his L hip, pelvis and back. The pain has moved around to his R lower back.   The pain has been going on 7-8 months, but he has been ignoring it. Drinking makes it worse. Walking makes it worse. Has numbness in his toes on both feet for about 4 weeks, maybe more.   He has cut down from 1/2 gallon to a fifth per day. Has not had a 1/2 gallon in several weeks.   He has had 5-6 BMs today, they were bloody. Having a BM did not change the pain. BMs are bloody, very dark, and some are lighter.   On exam, he has tenderness all the way down his spine, with tender areas in both buttocks. No problems  Stinging sort of pain going up his leg w/ straight leg raise bilaterally, L>R.   He has complained of numbness in toes on both feet, but sensation is intact now.   He has to wear a diaper, has problems controlling his urination. Has some groin numbness, going on x 4 weeks. Prior to that, was able to work, no incontinence.   His legs have been weak x 4 weeks also, has gone from a cane to a walker due to progressive weakness.  Dr Lowell Guitar will contact [facility] to see what kind of testing is needed.   Theodore Demark, PA-C 08/15/2021 4:27 PM

## 2021-08-15 NOTE — Telephone Encounter (Signed)
Pt needs MRI of spine I will order and let me know day /time he has no phone In shelter

## 2021-08-16 ENCOUNTER — Other Ambulatory Visit (HOSPITAL_COMMUNITY): Payer: Self-pay

## 2021-08-16 NOTE — Telephone Encounter (Signed)
Apt scheduled for July 1 at 330 at [facility]

## 2021-08-22 ENCOUNTER — Encounter: Payer: Self-pay | Admitting: Physician Assistant

## 2021-08-22 ENCOUNTER — Other Ambulatory Visit (HOSPITAL_COMMUNITY): Payer: Self-pay

## 2021-08-22 ENCOUNTER — Other Ambulatory Visit: Payer: Self-pay | Admitting: Critical Care Medicine

## 2021-08-22 MED ORDER — LORAZEPAM 0.5 MG PO TABS
ORAL_TABLET | ORAL | 0 refills | Status: AC
  Filled 2021-08-22: qty 40, 4d supply, fill #0

## 2021-08-22 NOTE — Telephone Encounter (Signed)
Bjorn Loser has cancelled this appt fyi  , my PA at shelter

## 2021-08-22 NOTE — Progress Notes (Signed)
Pt seen by Dr [name].  [facility] runs out 06/30, then has Medicaid.  Does not get SSI, or disability. Hopes to get that in the next year.   He is drinking less, 1/2 pint per day (was 1/2 gallon).  The Lorazepam helped, he did not drink but he was miserable. He was on edge, cussing people out.  He has abd pain, worse in the morning.  Has pain in back and pelvis. Has MRI scheduled 07/01 at [facility].   Has liver inflammation from drinking, had gastritis on EGD, bx w/out CA. Has to stay on the Carafate and Protonix, plus Creon.   Still sees some blood, usually red, after bowel movements. Not all the time.   He is worried about knots on his chest, they are where ribs meet sternum, no problem.  Is anemic, Hgb 7.7 on 06/15  The gabapentin helped the back pain some.   Gets the shakes when he does not drink, that made it hard to tolerate. Lorazepam helps >> refill.  110/75, 112, 95%  He got a new phone, cannot find it and does not know the number. Will look for it.   Theodore Demark, PA-C 08/22/2021 3:19 PM

## 2021-08-25 ENCOUNTER — Ambulatory Visit (HOSPITAL_COMMUNITY): Payer: 59

## 2021-08-29 ENCOUNTER — Encounter: Payer: Self-pay | Admitting: Physician Assistant

## 2021-08-29 ENCOUNTER — Encounter: Payer: Self-pay | Admitting: *Deleted

## 2021-08-29 ENCOUNTER — Encounter: Payer: Self-pay | Admitting: Critical Care Medicine

## 2021-08-29 ENCOUNTER — Encounter: Payer: Self-pay | Admitting: Pharmacist

## 2021-08-29 NOTE — Progress Notes (Signed)
Pt seen by Dr [name].  Pt has continued to drink, is drinking a fifth of vodka daily.  Has not started the lorazepam, but says he has it. The tapered dosing was explained to him.   Is interested in an inpatient rehab, if they will let him   Has not taken meds from the pill organizer consistently. "I don' want 'em."  Asks about a med to help him sleep, has not been prescribed one.   Had a little bit of blood in his vomitus this am.   His pill organizer was totally disorganized, some things missing and some things doubled up. Raquel fixed this.   He is encouraged to take meds in his box as it is filled.  Theodore Demark, PA-C 08/29/2021 3:06 PM

## 2021-09-04 ENCOUNTER — Ambulatory Visit: Payer: 59 | Admitting: Critical Care Medicine

## 2021-09-06 ENCOUNTER — Encounter: Payer: Self-pay | Admitting: *Deleted

## 2021-09-06 NOTE — Congregational Nurse Program (Signed)
Did not see pt Filled pill box Pt took 3 sections of meds out of 28 this past week.

## 2021-09-19 ENCOUNTER — Telehealth: Payer: Self-pay

## 2021-09-19 ENCOUNTER — Encounter: Payer: Self-pay | Admitting: Physician Assistant

## 2021-09-19 ENCOUNTER — Other Ambulatory Visit: Payer: Self-pay | Admitting: Critical Care Medicine

## 2021-09-19 MED ORDER — LORAZEPAM 0.5 MG PO TABS
0.5000 mg | ORAL_TABLET | Freq: Three times a day (TID) | ORAL | 0 refills | Status: DC
Start: 2021-09-19 — End: 2021-09-20

## 2021-09-19 NOTE — Progress Notes (Signed)
Pt seen by Dr [name].  He is drinking a fifth/day.   Says he is taking all his meds.   He says he needs to go to rehab.   Says Lorin Picket is working on getting him into a facility, says it will be next week. Lorin Picket was contacted and there is no plan in place. PW to work with Reginia Naas, CSM on getting him an in-pt rehab facility.   He was given rx for Lorazepam, was strongly advised not to drink when taking it.   Has belly pain in the mornings, when he has not had anything to drink.   His chair is broken, the L brake does not work. The wire has come out. We will try to fix it next week.   Theodore Demark, PA-C 09/19/2021 5:06 PM'

## 2021-09-19 NOTE — Telephone Encounter (Signed)
Samuel Blevins, [facility] with [facility] pharmacy calling needing clarification on lorazepam 0.5mg . there are  2 different instructions on there. She was unable to hold while I reviewed chart so wants a fu call with correct directions.

## 2021-09-20 ENCOUNTER — Other Ambulatory Visit (HOSPITAL_COMMUNITY): Payer: Self-pay

## 2021-09-20 ENCOUNTER — Encounter: Payer: Self-pay | Admitting: *Deleted

## 2021-09-20 ENCOUNTER — Telehealth (INDEPENDENT_AMBULATORY_CARE_PROVIDER_SITE_OTHER): Payer: Self-pay

## 2021-09-20 ENCOUNTER — Other Ambulatory Visit: Payer: Self-pay | Admitting: *Deleted

## 2021-09-20 ENCOUNTER — Other Ambulatory Visit: Payer: Self-pay

## 2021-09-20 ENCOUNTER — Telehealth: Payer: Self-pay | Admitting: Critical Care Medicine

## 2021-09-20 MED ORDER — THIAMINE HCL 100 MG PO TABS
100.0000 mg | ORAL_TABLET | Freq: Every day | ORAL | 0 refills | Status: DC
Start: 2021-09-20 — End: 2021-09-27

## 2021-09-20 MED ORDER — GABAPENTIN 100 MG PO CAPS: 100.0000 mg | ORAL_CAPSULE | Freq: Three times a day (TID) | ORAL | 0 refills | Status: DC

## 2021-09-20 MED ORDER — PANTOPRAZOLE SODIUM 40 MG PO TBEC: 40.0000 mg | DELAYED_RELEASE_TABLET | Freq: Two times a day (BID) | ORAL | 0 refills | Status: DC

## 2021-09-20 MED ORDER — IRON (FERROUS SULFATE) 325 (65 FE) MG PO TABS: 325.0000 mg | ORAL_TABLET | Freq: Every day | ORAL | 2 refills | Status: DC

## 2021-09-20 MED ORDER — LORAZEPAM 0.5 MG PO TABS
ORAL_TABLET | ORAL | 0 refills | Status: DC
  Filled 2021-09-20: qty 37, 4d supply, fill #0

## 2021-09-20 MED ORDER — SUCRALFATE 1 G PO TABS: 1.0000 g | ORAL_TABLET | Freq: Three times a day (TID) | ORAL | 0 refills | Status: DC

## 2021-09-20 MED ORDER — PANCREAZE 37000-97300 UNITS PO CPEP
1.0000 | ORAL_CAPSULE | Freq: Three times a day (TID) | ORAL | 2 refills | Status: DC
Start: 2021-09-20 — End: 2021-09-27

## 2021-09-20 MED ORDER — FOLIC ACID 1 MG PO TABS: 1.0000 mg | ORAL_TABLET | Freq: Every day | ORAL | 2 refills | Status: DC

## 2021-09-20 NOTE — Telephone Encounter (Signed)
Rx clarified

## 2021-09-20 NOTE — Addendum Note (Signed)
Addended by: Shan Levans E on: 09/20/2021 10:22 AM   Modules accepted: Orders

## 2021-09-20 NOTE — Telephone Encounter (Signed)
This is the patient I teams you about, he needs Alcohol rehab and he is ready to go.  He is drinking 1 pt to 1/5 Vodka daily and resides at homeless shelter   I am his pcp  I have detoxed him three times with ativan taper and he is back to drinking again  He needs inpatient etoh rehab and is ready to go  his phone works   he said he would answer  Please call him today to discuss possible resources and connect him to care.  The shelter has tried but he says he will go if I/we recommend.

## 2021-09-20 NOTE — Congregational Nurse Program (Signed)
Seen by dr [name] for severe alcoholism

## 2021-09-20 NOTE — Telephone Encounter (Signed)
LCSWA contacted pt per referral from PCP. Pt stated that he is ready to get help and has insurance. LCSWA contacted Samuel Blevins and spoke with Samuel Blevins. [phone] ext [phone]- intake coordinator. She needs the patients ssn to start the process.LCSWA contacted pt back to get the information, however pt did not feel comfortable sharing his ssn with LCSWA and only wanted to speak with Dr. [name]. LCSWA attempted to share contact information with pt to allow him to call Samuel Duster to provide the information but pt refused. Samuel Blevins intake coordinator advised that if he has not been taken ativan to detox lately then he needs to go to [facility] [phone] or [phone] [employer] or [place] [facility] [phone] to dexto first and be cleared before admission to [facility] inpatient services. Samuel Blevins fax# [phone]- needs his medical assessment from the time he started ativan up to today. LCSWA also contacted Samuel Blevins and the [facility] as well- they do not take his insurance. LCSWA plans to contact Samuel Blevins [phone], Samuel Blevins as well. Pt and LCSWA call ended due to phone complications.

## 2021-09-20 NOTE — Telephone Encounter (Signed)
Copied from CRM [id number]. Topic: General - Other >> Sep 20, 2021  4:50 PM Everette C wrote: Reason for CRM: The patient has returned a missed call from staff member Toni Amend   Please contact again when possible  The patient is cautious when answering phone numbers that appear unlisted and would like to be contacted from a [facility] number again if possible

## 2021-09-20 NOTE — Telephone Encounter (Signed)
Pharmacy requesting clarity on the sig:      Sig: Take 1 tablet (0.5 mg total) by mouth 3 (three) times daily. 4 tablets QID x 1 day, then 3 tabs QID x 1 day, then 2 tabs QID x 1, then 1 tab QID  States it seems there are 2 sets of directions.  1 po Tid and then 4 po QID.   Please call [facility] pharmacy to clarify.

## 2021-09-21 NOTE — Telephone Encounter (Signed)
Called pt again today, to offer going to behavioral health urgent care to detox and then he should have medical clearance to attend [facility] as previously stated in notes. Pt reports that he is not interested in going anywhere until after August 7th due to so personal financial things. Other resources offered to pt are: [facility]- [phone] [address], [place], [place] [address], No insurance needed, InPATIENT, offer suboxone, detox program and can refer to rehab.

## 2021-09-21 NOTE — Telephone Encounter (Signed)
Called pt and left vm   Will rout to dr [name] so he is aware

## 2021-09-21 NOTE — Telephone Encounter (Signed)
Patient called back again to speka with Dr [name] or nurse, he says cant get into shelter until 7pm then said he would have to sleep outside with a sleeping bag

## 2021-09-25 NOTE — Telephone Encounter (Signed)
On 09/21/21 LCSWA contacted pt to share information in regards to [facility]. Pt needs to confirm 8/3 appt or 8/7 appt arriving between 7-7:45 either date, after confirming. Currently he is not scheduled because he reports he needs to wait until he handles a persona matter. Pt would need to be working towards detox before entering the program.  Location is [address] [employer] [place]. If pt is taking any medications he would need a 30 day supply as well as one month refill available. Belenda Cruise is the intake coordinator and would love to assist him and schedule an appointment to begin. [phone] ext. [phone]  Below are items Mr. Ponds can and cannot take when he arrives. When he arrives he will have to intake assessment on with a nurse for health and and other with a Child psychotherapist for any mental health concerns. Once admitted he will stay for 28 to 30 days possibly longer.   TO ALL TEAM MEMBERS                     ITEMS CLIENTS CANNOT BRING INTO TREATMENT  No pillows, no blankets, towels, bath cloths, no perfumes, no colognes, no aerosol sprays of any kind, No tweezers, no manicuring tools. (Facility provides [employer], toenail and fingernail clippers). No eyelash glue, no fake eyelashes, no excessive amounts of makeup, no excessive amounts of toiletries, only 3 pair of shoes (1 pair must be atheletic), 1 pair of slides, 1 pair of bedroom slippers No half shirts, crop tops, tube tops, halter tops, spaghetti strap shirts, tank tops (wife beaters), and cut-offs; No leggings, jeggings, yoga pants, dresses, and/or skirts are allowed; No clothing advertising sex, drugs, alcohol, tobacco, or violence, political, or religious symbols. No torn clothing such as jeans with holes or torn knee areas, or that contain excessively large holes or cuts in them.  No gang identified clothing or clothing advertising negative role models. No high heels, dresses, skirts, no short shorts Shorts must be at  least knee length or longer than the client's fingertips (male and male) No perms, no hair coloring solutions, magic shave, no over the counter medicated powders or ointments, or facial cleansers.  No laundry detergents of any kind (we supply detergent for sensitive skin)  ITEMS YOU CAN BRING   14 SHIRTS/BLOUSES  14 PAIR OF UNDERWEAR/PANTIES  3 PAIR OF SHOES WITH ONE PAIR OF ATHLETIC SHOES  14 PAIR OF PANTS/SHORTS (MUST BE LONGER THATN THE TIPS OF YOUR FINGERS) I   INCLUDED IN THE NUMBER  14 PAIR OF SOCKS  TOILETRIES-2 DEODORANTS, 2 SHAMPOOS, 2 CONDITIONERS, 2 TOOTHBRUSHES  HAIR CLIPPERS, RAZORS, BEARD TRIMMERS  BRAS AT LEAST 2 OR MORE  HARD CANDY ONLY  NICOTINE PATCHES (NO GUM OR LOSENGES)

## 2021-09-26 ENCOUNTER — Ambulatory Visit (HOSPITAL_COMMUNITY)
Admission: EM | Admit: 2021-09-26 | Discharge: 2021-09-26 | Disposition: A | Discharge status: Home / Self Care | Payer: 59

## 2021-09-26 ENCOUNTER — Encounter: Payer: Self-pay | Admitting: Physician Assistant

## 2021-09-26 NOTE — BH Assessment (Addendum)
Around 1550 patient requested to leave. Pt belongings was provided to him and TTS encouraged pt to contact his social worker from [employer] for transportation. Pt stated "fuck you".   Around 1620 patient stopped a staff member leaving from work stating that he has been waiting since 1pm and has not been helped. TTS informed staff and reminded pt that he requested to leave and was not seen because he left the building and in order for him to receive treatment he would need to be registered again. Pt requesting services again. Waiver signed.

## 2021-09-26 NOTE — BH Assessment (Signed)
Clinician reviewed the MSE with patient  and he refused to sign.

## 2021-09-26 NOTE — BH Assessment (Addendum)
Comprehensive Clinical Assessment (CCA) Screening, Triage and Referral Note  09/26/2021 Samuel Blevins [id number]  Disposition: Triage/Screening complete. Patient is Routine. Requested patient to be roomed.   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 Other (Comment) (Case Manager with [employer])  What Is the Reason for Your Visit/Call Today?  Samuel Blevins is a 56 y/o male that presents to the [facility]. He walked into the facility with a male and male. However, when asked who accompanies him he says, "No body". His complaint is alcoholism.   He started drinking alcohol at 56 yrs old. He reports drinking daily for the past 42 years. He doesn't drink beer, liquor only.  Average amount of use, "All the liquor I can get". Last drink was 10 minutes prior to arrival and he reports drinking a 1/2 gallon of liquor. He has a history of seizures. Last seizure was Sunday (09/23/2021) and he reports a history of DT's. No current withdrawal symptoms. However, says that when he doesn't drink alcohol he tends to experience "bad tremors, throws up blood, and anxiety". No history of substance use treatment.       Denies history of mental health issues. Denies SI and HI. Patient asked if he has a history of AVH's and he responds,  "If I tell you the truth you are going to put me in the crazy house". He will not confirm or deny hx of AVH's. No history of mental health outpatient supports.       Patient homeless for the past 3 years. Support system is his case Production designer, theatre/television/film with [employer]. He is very irritable. Patient repeatedly asking to leave throughout the Screening/Triage process.  How Long Has This Been Causing You Problems? > than 6 months  What Do You Feel Would Help You the Most Today? No data recorded  Have You Recently Had Any Thoughts About Hurting  Yourself? No  Are You Planning to Commit Suicide/Harm Yourself At This time? No   Have you Recently Had Thoughts About Hurting Someone [name]? No  Are You Planning to Harm Someone at This Time? No  Explanation: No data recorded  Have You Used Any Alcohol or Drugs in the Past 24 Hours? Yes  How Long Ago Did You Use Drugs or Alcohol? No data recorded What Did You Use and How Much? His complaint is alcoholism. He started drinking alcohol at 56 yrs old. He reports drinking daily for the past 42 years. Average amount of use, "All the liquor I can get". Last drink was 10 minutes prior to arrival and he reports drinking a 1/2 gallon of liquor. He has a history of seizures. Last seizure was Sunday (09/23/2021) and he reports a history of DT's. No current withdrawal symptoms. However, says that when he doesn't drink alcohol he tends to experience "bad tremors, throws up blood, and anxiety". No history of substance use treatment.   Do You Currently Have a Therapist/Psychiatrist? No data recorded Name of Therapist/Psychiatrist: No data recorded  Have You Been Recently Discharged From Any Office Practice or Programs? No data recorded Explanation of Discharge From Practice/Program: No data recorded   Determination of Need: Routine (7 days)   Options For Referral: Medication Management; Inpatient Hospitalization; Facility-Based Crisis; Other: Comment (Residential Treatment)   Discharge Disposition:     Melynda Ripple, Counselor

## 2021-09-26 NOTE — Progress Notes (Cosign Needed Addendum)
Pt seen by Dr [name].   Pt given his lorazepam last week.  Pt says he takes the pills in the pill box.  Says is drinking 1 pint/day.   Says the pills make his urge to drink less.  He vomited today, had some blood in it.   Had some blood in his bowel movements.   He needs to leave next Tuesday, because he wants help. He is upset. Says he trusts P.W.  It was made clear to him that if he does not quit drinking, he will die.   Says he cannot go to [facility] because there are too [employer] nearby.   Says he cannot go today.   Says he has liquor in his bookbag and his pocket. Upset that Lawanna Kobus has his bookbag because she will take the liquor out.   He was told he needs to go to rehab today, he refuses.   He was told he needs to meet with [facility] today. Lawanna Kobus has him a rehab appt, he needs to speak to them on Monday Morning.   He left the clinic room  Today's Vitals   09/26/21 1503  BP: (!) 144/87  Pulse: (!) 110  Resp: 20  SpO2: 98%   There is no height or weight on file to calculate BMI.  Theodore Demark, PA-C 09/26/2021 3:21 PM

## 2021-09-27 ENCOUNTER — Other Ambulatory Visit (HOSPITAL_COMMUNITY): Payer: Self-pay

## 2021-09-27 ENCOUNTER — Telehealth: Payer: Self-pay | Admitting: Critical Care Medicine

## 2021-09-27 ENCOUNTER — Encounter: Payer: Self-pay | Admitting: *Deleted

## 2021-09-27 ENCOUNTER — Other Ambulatory Visit: Payer: Self-pay | Admitting: *Deleted

## 2021-09-27 MED ORDER — IRON (FERROUS SULFATE) 325 (65 FE) MG PO TABS
325.0000 mg | ORAL_TABLET | Freq: Every day | ORAL | 2 refills | Status: DC
  Filled 2021-09-27: qty 60, 60d supply, fill #0

## 2021-09-27 MED ORDER — LORAZEPAM 0.5 MG PO TABS: ORAL_TABLET | ORAL | 0 refills | Status: DC

## 2021-09-27 MED ORDER — ADULT MULTIVITAMIN W/MINERALS CH
1.0000 | ORAL_TABLET | Freq: Every day | ORAL | 0 refills | Status: DC
  Filled 2021-09-27: qty 30, 30d supply, fill #0

## 2021-09-27 MED ORDER — PANCREAZE 37000-97300 UNITS PO CPEP
1.0000 | ORAL_CAPSULE | Freq: Three times a day (TID) | ORAL | 2 refills | Status: DC
  Filled 2021-09-27: qty 90, 30d supply, fill #0

## 2021-09-27 MED ORDER — THIAMINE HCL 100 MG PO TABS
100.0000 mg | ORAL_TABLET | Freq: Every day | ORAL | 0 refills | Status: DC
  Filled 2021-09-27: qty 100, 100d supply, fill #0

## 2021-09-27 MED ORDER — PANTOPRAZOLE SODIUM 40 MG PO TBEC
40.0000 mg | DELAYED_RELEASE_TABLET | Freq: Two times a day (BID) | ORAL | 0 refills | Status: DC
  Filled 2021-09-27: qty 60, 30d supply, fill #0

## 2021-09-27 MED ORDER — SUCRALFATE 1 G PO TABS
1.0000 g | ORAL_TABLET | Freq: Three times a day (TID) | ORAL | 0 refills | Status: DC
  Filled 2021-09-27: qty 120, 30d supply, fill #0

## 2021-09-27 MED ORDER — FOLIC ACID 1 MG PO TABS
1.0000 mg | ORAL_TABLET | Freq: Every day | ORAL | 2 refills | Status: DC
  Filled 2021-09-27: qty 30, 30d supply, fill #0

## 2021-09-27 MED ORDER — GABAPENTIN 100 MG PO CAPS
100.0000 mg | ORAL_CAPSULE | Freq: Three times a day (TID) | ORAL | 0 refills | Status: DC
  Filled 2021-09-27: qty 90, 30d supply, fill #0

## 2021-09-27 NOTE — Telephone Encounter (Signed)
Call this patient this morning he had left detox after 3 hours he thought he was there 8 hours.  I told him it is imperative for him to go back to go for [place] urgent care they are open 24/7 and present himself for detox and start the process.  He claims he will not do this until 8 August.  He was confused in his mind when he was taken there yesterday because he thought he was going to the rehab center.  I reiterated he must spend at least a minimum of 5 days of detox so that he can be cleared medically for his stay at the rehab center.  He sort of understands it but I do not think he processes and remembers the sayings.  He is now back at the shelter Myriam Jacobson would you check on him today and reiterate again to him the need that he will have to go anytime to [facility] behavioral health and that is the start of the process and then from there he will go to the rehab center  I did not make any refills on his medications now and I am glad I did not because he did not follow through

## 2021-09-27 NOTE — Telephone Encounter (Signed)
Opened in error  This encounter was created in error - please disregard. 

## 2021-09-27 NOTE — Congregational Nurse Program (Signed)
Made pt pillbox Pt continues to take meds inappropriately Pt continues to consume alcohol Will continue to offer support and prayers

## 2021-09-27 NOTE — Telephone Encounter (Signed)
The clinic RN Myriam Jacobson will follow up

## 2021-09-27 NOTE — Telephone Encounter (Signed)
Pt called to tell Dr. [name] that he will not get checked in to [facility] until August 8. Pt stated he has business "on the street." Pt asked for address of [facility] and that was provided. Pt stated that he wants access to his medicine but it is behind the counter and he wants to make sure it goes with him. Advised pt to discus this with the caregivers at [facility]. Pt was curt during call and occasionally confrontational. Routing message to office.

## 2021-10-02 ENCOUNTER — Ambulatory Visit (HOSPITAL_COMMUNITY)
Admission: EM | Admit: 2021-10-02 | Discharge: 2021-10-02 | Disposition: A | Discharge status: Other Acute Inpatient Hospital | Payer: No Payment, Other | Attending: Psychiatry | Admitting: Psychiatry

## 2021-10-02 ENCOUNTER — Encounter (HOSPITAL_COMMUNITY): Payer: Self-pay | Admitting: Emergency Medicine

## 2021-10-02 ENCOUNTER — Other Ambulatory Visit (HOSPITAL_COMMUNITY): Payer: Self-pay

## 2021-10-02 ENCOUNTER — Other Ambulatory Visit: Payer: Self-pay

## 2021-10-02 ENCOUNTER — Other Ambulatory Visit: Payer: Self-pay | Admitting: Critical Care Medicine

## 2021-10-02 ENCOUNTER — Inpatient Hospital Stay (HOSPITAL_COMMUNITY): Payer: Commercial Managed Care - HMO

## 2021-10-02 ENCOUNTER — Inpatient Hospital Stay (HOSPITAL_COMMUNITY)
Admission: EM | Admit: 2021-10-02 | Discharge: 2021-10-07 | DRG: 896 | Disposition: A | Discharge status: Home / Self Care | Payer: Commercial Managed Care - HMO | Attending: Internal Medicine | Admitting: Internal Medicine

## 2021-10-02 DIAGNOSIS — F419 Anxiety disorder, unspecified: Secondary | ICD-10-CM | POA: Diagnosis present

## 2021-10-02 DIAGNOSIS — K859 Acute pancreatitis without necrosis or infection, unspecified: Secondary | ICD-10-CM | POA: Diagnosis present

## 2021-10-02 DIAGNOSIS — K861 Other chronic pancreatitis: Secondary | ICD-10-CM | POA: Diagnosis present

## 2021-10-02 DIAGNOSIS — F1020 Alcohol dependence, uncomplicated: Secondary | ICD-10-CM | POA: Insufficient documentation

## 2021-10-02 DIAGNOSIS — Z8719 Personal history of other diseases of the digestive system: Secondary | ICD-10-CM | POA: Insufficient documentation

## 2021-10-02 DIAGNOSIS — R519 Headache, unspecified: Secondary | ICD-10-CM | POA: Insufficient documentation

## 2021-10-02 DIAGNOSIS — Z560 Unemployment, unspecified: Secondary | ICD-10-CM

## 2021-10-02 DIAGNOSIS — D72819 Decreased white blood cell count, unspecified: Secondary | ICD-10-CM | POA: Diagnosis not present

## 2021-10-02 DIAGNOSIS — F10239 Alcohol dependence with withdrawal, unspecified: Secondary | ICD-10-CM | POA: Diagnosis present

## 2021-10-02 DIAGNOSIS — D509 Iron deficiency anemia, unspecified: Secondary | ICD-10-CM | POA: Diagnosis present

## 2021-10-02 DIAGNOSIS — Z79899 Other long term (current) drug therapy: Secondary | ICD-10-CM | POA: Diagnosis not present

## 2021-10-02 DIAGNOSIS — E876 Hypokalemia: Secondary | ICD-10-CM | POA: Diagnosis present

## 2021-10-02 DIAGNOSIS — R7401 Elevation of levels of liver transaminase levels: Secondary | ICD-10-CM | POA: Diagnosis not present

## 2021-10-02 DIAGNOSIS — Z5900 Homelessness unspecified: Secondary | ICD-10-CM | POA: Insufficient documentation

## 2021-10-02 DIAGNOSIS — I10 Essential (primary) hypertension: Secondary | ICD-10-CM | POA: Diagnosis present

## 2021-10-02 DIAGNOSIS — F10230 Alcohol dependence with withdrawal, uncomplicated: Secondary | ICD-10-CM | POA: Diagnosis present

## 2021-10-02 DIAGNOSIS — D500 Iron deficiency anemia secondary to blood loss (chronic): Secondary | ICD-10-CM | POA: Diagnosis not present

## 2021-10-02 DIAGNOSIS — R109 Unspecified abdominal pain: Secondary | ICD-10-CM | POA: Insufficient documentation

## 2021-10-02 DIAGNOSIS — R110 Nausea: Secondary | ICD-10-CM | POA: Insufficient documentation

## 2021-10-02 DIAGNOSIS — F10939 Alcohol use, unspecified with withdrawal, unspecified: Secondary | ICD-10-CM | POA: Diagnosis present

## 2021-10-02 DIAGNOSIS — F10930 Alcohol use, unspecified with withdrawal, uncomplicated: Principal | ICD-10-CM

## 2021-10-02 DIAGNOSIS — R251 Tremor, unspecified: Secondary | ICD-10-CM | POA: Insufficient documentation

## 2021-10-02 LAB — CBC WITH DIFFERENTIAL/PLATELET
Abs Immature Granulocytes: 0.02 10*3/uL (ref 0.00–0.07)
Basophils Absolute: 0 10*3/uL (ref 0.0–0.1)
Basophils Relative: 1 %
Eosinophils Absolute: 0 10*3/uL (ref 0.0–0.5)
Eosinophils Relative: 1 %
HCT: 33.4 % — ABNORMAL LOW (ref 39.0–52.0)
Hemoglobin: 10.8 g/dL — ABNORMAL LOW (ref 13.0–17.0)
Immature Granulocytes: 1 %
Lymphocytes Relative: 23 %
Lymphs Abs: 1 10*3/uL (ref 0.7–4.0)
MCH: 26.8 pg (ref 26.0–34.0)
MCHC: 32.3 g/dL (ref 30.0–36.0)
MCV: 82.9 fL (ref 80.0–100.0)
Monocytes Absolute: 0.5 10*3/uL (ref 0.1–1.0)
Monocytes Relative: 11 %
Neutro Abs: 2.7 10*3/uL (ref 1.7–7.7)
Neutrophils Relative %: 63 %
Platelets: 135 10*3/uL — ABNORMAL LOW (ref 150–400)
RBC: 4.03 MIL/uL — ABNORMAL LOW (ref 4.22–5.81)
RDW: 22.2 % — ABNORMAL HIGH (ref 11.5–15.5)
WBC: 4.2 10*3/uL (ref 4.0–10.5)
nRBC: 0 % (ref 0.0–0.2)

## 2021-10-02 LAB — COMPREHENSIVE METABOLIC PANEL
ALT: 65 U/L — ABNORMAL HIGH (ref 0–44)
AST: 197 U/L — ABNORMAL HIGH (ref 15–41)
Albumin: 3.5 g/dL (ref 3.5–5.0)
Alkaline Phosphatase: 92 U/L (ref 38–126)
Anion gap: 13 (ref 5–15)
BUN: 5 mg/dL — ABNORMAL LOW (ref 6–20)
CO2: 26 mmol/L (ref 22–32)
Calcium: 9.3 mg/dL (ref 8.9–10.3)
Chloride: 96 mmol/L — ABNORMAL LOW (ref 98–111)
Creatinine, Ser: 0.63 mg/dL (ref 0.61–1.24)
GFR, Estimated: >60 mL/min (ref >60)
Glucose, Bld: 103 mg/dL — ABNORMAL HIGH (ref 70–99)
Potassium: 2.7 mmol/L — CL (ref 3.5–5.1)
Sodium: 135 mmol/L (ref 135–145)
Total Bilirubin: 2 mg/dL — ABNORMAL HIGH (ref 0.3–1.2)
Total Protein: 8.2 g/dL — ABNORMAL HIGH (ref 6.5–8.1)

## 2021-10-02 LAB — MAGNESIUM: Magnesium: 1.2 mg/dL — ABNORMAL LOW (ref 1.7–2.4)

## 2021-10-02 LAB — LIPASE, BLOOD: Lipase: 177 U/L — ABNORMAL HIGH (ref 11–51)

## 2021-10-02 LAB — ETHANOL: Alcohol, Ethyl (B): <10 mg/dL (ref <10)

## 2021-10-02 MED ORDER — ADULT MULTIVITAMIN W/MINERALS CH
1.0000 | ORAL_TABLET | Freq: Every day | ORAL | Status: DC
  Administered 2021-10-02 – 2021-10-07 (×6): 1 via ORAL
  Filled 2021-10-02 (×6): qty 1

## 2021-10-02 MED ORDER — HYDROMORPHONE HCL 1 MG/ML IJ SOLN
0.5000 mg | INTRAMUSCULAR | Status: DC | PRN
  Administered 2021-10-03 – 2021-10-06 (×6): 0.5 mg via INTRAVENOUS
  Filled 2021-10-02 (×6): qty 0.5

## 2021-10-02 MED ORDER — LORAZEPAM 1 MG PO TABS
0.0000 mg | ORAL_TABLET | Freq: Four times a day (QID) | ORAL | Status: AC
  Administered 2021-10-04: 2 mg via ORAL
  Filled 2021-10-02: qty 2
  Filled 2021-10-02: qty 1

## 2021-10-02 MED ORDER — LORAZEPAM 2 MG/ML IJ SOLN
0.0000 mg | Freq: Four times a day (QID) | INTRAMUSCULAR | Status: AC
  Administered 2021-10-02 – 2021-10-03 (×2): 1 mg via INTRAVENOUS
  Administered 2021-10-03 (×2): 2 mg via INTRAVENOUS
  Filled 2021-10-02 (×3): qty 1

## 2021-10-02 MED ORDER — IOHEXOL 300 MG/ML  SOLN
90.0000 mL | Freq: Once | INTRAMUSCULAR | Status: AC | PRN
  Administered 2021-10-02: 90 mL via INTRAVENOUS

## 2021-10-02 MED ORDER — PANTOPRAZOLE SODIUM 40 MG PO TBEC
40.0000 mg | DELAYED_RELEASE_TABLET | Freq: Every day | ORAL | Status: DC
  Administered 2021-10-02 – 2021-10-07 (×6): 40 mg via ORAL
  Filled 2021-10-02 (×6): qty 1

## 2021-10-02 MED ORDER — FOLIC ACID 1 MG PO TABS
1.0000 mg | ORAL_TABLET | Freq: Every day | ORAL | Status: DC
  Administered 2021-10-02 – 2021-10-07 (×6): 1 mg via ORAL
  Filled 2021-10-02 (×6): qty 1

## 2021-10-02 MED ORDER — ENOXAPARIN SODIUM 40 MG/0.4ML IJ SOSY
40.0000 mg | PREFILLED_SYRINGE | INTRAMUSCULAR | Status: DC
  Administered 2021-10-03 – 2021-10-06 (×4): 40 mg via SUBCUTANEOUS
  Filled 2021-10-02 (×4): qty 0.4

## 2021-10-02 MED ORDER — LACTATED RINGERS IV SOLN: INTRAVENOUS | Status: AC

## 2021-10-02 MED ORDER — POTASSIUM CHLORIDE CRYS ER 20 MEQ PO TBCR
40.0000 meq | EXTENDED_RELEASE_TABLET | Freq: Once | ORAL | Status: AC
  Administered 2021-10-02: 40 meq via ORAL
  Filled 2021-10-02: qty 2

## 2021-10-02 MED ORDER — POTASSIUM CHLORIDE 10 MEQ/100ML IV SOLN
10.0000 meq | Freq: Once | INTRAVENOUS | Status: AC
  Administered 2021-10-02: 10 meq via INTRAVENOUS
  Filled 2021-10-02: qty 100

## 2021-10-02 MED ORDER — GABAPENTIN 100 MG PO CAPS
100.0000 mg | ORAL_CAPSULE | Freq: Three times a day (TID) | ORAL | 0 refills | Status: DC
  Filled 2021-10-02: qty 180, 60d supply, fill #0

## 2021-10-02 MED ORDER — IRON (FERROUS SULFATE) 325 (65 FE) MG PO TABS
325.0000 mg | ORAL_TABLET | Freq: Every day | ORAL | 2 refills | Status: DC
  Filled 2021-10-02: qty 60, 60d supply, fill #0

## 2021-10-02 MED ORDER — ACETAMINOPHEN 650 MG RE SUPP: 650.0000 mg | Freq: Four times a day (QID) | RECTAL | Status: DC | PRN

## 2021-10-02 MED ORDER — LACTATED RINGERS IV BOLUS
1000.0000 mL | Freq: Once | INTRAVENOUS | Status: AC
  Administered 2021-10-02: 1000 mL via INTRAVENOUS

## 2021-10-02 MED ORDER — POTASSIUM CHLORIDE 10 MEQ/100ML IV SOLN
10.0000 meq | INTRAVENOUS | Status: AC
  Administered 2021-10-02 (×2): 10 meq via INTRAVENOUS
  Filled 2021-10-02: qty 100

## 2021-10-02 MED ORDER — ONDANSETRON HCL 4 MG/2ML IJ SOLN
4.0000 mg | Freq: Four times a day (QID) | INTRAMUSCULAR | Status: DC | PRN
  Administered 2021-10-03 – 2021-10-06 (×2): 4 mg via INTRAVENOUS
  Filled 2021-10-02 (×2): qty 2

## 2021-10-02 MED ORDER — PANCRELIPASE (LIP-PROT-AMYL) 12000-38000 UNITS PO CPEP
12000.0000 [IU] | ORAL_CAPSULE | Freq: Three times a day (TID) | ORAL | 0 refills | Status: DC
  Filled 2021-10-02: qty 90, 30d supply, fill #0

## 2021-10-02 MED ORDER — LORAZEPAM 1 MG PO TABS: 0.0000 mg | ORAL_TABLET | Freq: Two times a day (BID) | ORAL | Status: AC

## 2021-10-02 MED ORDER — MAGNESIUM SULFATE 2 GM/50ML IV SOLN
2.0000 g | Freq: Once | INTRAVENOUS | Status: AC
  Administered 2021-10-02: 2 g via INTRAVENOUS
  Filled 2021-10-02: qty 50

## 2021-10-02 MED ORDER — PANCREAZE 37000-97300 UNITS PO CPEP
1.0000 | ORAL_CAPSULE | Freq: Three times a day (TID) | ORAL | 2 refills | Status: DC
  Filled 2021-10-02: qty 90, fill #0

## 2021-10-02 MED ORDER — FOLIC ACID 1 MG PO TABS: 1.0000 mg | ORAL_TABLET | Freq: Every day | ORAL | 2 refills | Status: DC

## 2021-10-02 MED ORDER — THIAMINE HCL 100 MG PO TABS
100.0000 mg | ORAL_TABLET | Freq: Every day | ORAL | 0 refills | Status: DC
Start: 2021-10-02 — End: 2022-06-16
  Filled 2021-10-02: qty 100, 100d supply, fill #0

## 2021-10-02 MED ORDER — THIAMINE HCL 100 MG PO TABS
100.0000 mg | ORAL_TABLET | Freq: Every day | ORAL | Status: DC
  Administered 2021-10-04 – 2021-10-07 (×4): 100 mg via ORAL
  Filled 2021-10-02 (×4): qty 1

## 2021-10-02 MED ORDER — SUCRALFATE 1 G PO TABS
1.0000 g | ORAL_TABLET | Freq: Three times a day (TID) | ORAL | 0 refills | Status: DC
  Filled 2021-10-02: qty 120, 30d supply, fill #0

## 2021-10-02 MED ORDER — ONDANSETRON HCL 4 MG PO TABS: 4.0000 mg | ORAL_TABLET | Freq: Four times a day (QID) | ORAL | Status: DC | PRN

## 2021-10-02 MED ORDER — ACETAMINOPHEN 325 MG PO TABS
650.0000 mg | ORAL_TABLET | Freq: Four times a day (QID) | ORAL | Status: DC | PRN
  Administered 2021-10-03: 650 mg via ORAL
  Filled 2021-10-02: qty 2

## 2021-10-02 MED ORDER — POTASSIUM CHLORIDE 10 MEQ/100ML IV SOLN
10.0000 meq | INTRAVENOUS | Status: DC
  Filled 2021-10-02: qty 100

## 2021-10-02 MED ORDER — THIAMINE HCL 100 MG/ML IJ SOLN
100.0000 mg | Freq: Every day | INTRAMUSCULAR | Status: DC
  Administered 2021-10-02 – 2021-10-03 (×2): 100 mg via INTRAVENOUS
  Filled 2021-10-02 (×4): qty 2

## 2021-10-02 MED ORDER — PANTOPRAZOLE SODIUM 40 MG PO TBEC
40.0000 mg | DELAYED_RELEASE_TABLET | Freq: Two times a day (BID) | ORAL | 0 refills | Status: DC
  Filled 2021-10-02: qty 60, 60d supply, fill #0

## 2021-10-02 MED ORDER — LORAZEPAM 2 MG/ML IJ SOLN: 0.0000 mg | Freq: Two times a day (BID) | INTRAMUSCULAR | Status: AC

## 2021-10-02 MED ORDER — LORAZEPAM 1 MG PO TABS
1.0000 mg | ORAL_TABLET | Freq: Once | ORAL | Status: AC
Start: 2021-10-02 — End: 2021-10-02
  Administered 2021-10-02: 1 mg via ORAL
  Filled 2021-10-02: qty 1

## 2021-10-02 NOTE — BH Assessment (Signed)
Comprehensive Clinical Assessment (CCA) Screening, Triage and Referral Note  10/02/2021 Samuel Blevins [id number]  Disposition: Per Vernard Gambles, NP patient will transfer to the ED for further evaluation and possible medical admission for acute withdrawal.  Patient has a bed as [facility] per [facility] case manager, for once he completes detox.  The patient demonstrates the following risk factors for suicide: Chronic risk factors for suicide include: substance use disorder. Acute risk factors for suicide include: social withdrawal/isolation and loss (financial, interpersonal, professional). Protective factors for this patient include: positive social support, coping skills, and hope for the future. Considering these factors, the overall suicide risk at this point appears to be low. Patient is appropriate for outpatient follow up.  Patient is a 56 year old male with a history of Alcohol Use Disorder, severe who presents voluntarily to [facility] for assessment. Patient presents requesting alcohol detox treatment.  Patient was seen here 2-3 times on 8/2 and each time he left before being seen.  He was rather irritable, demanding and disrespectful towards staff at the time. Today, he states he will "stay, I can't leave this time.  I need treatment."  He reports he has been drinking daily for the past 6 years or more.  He has been drinking a fifth and a pint daily.   He has not had any periods of sobriety during this time.  Per chart review, it appears patient had a recent seizure.  He recalls falling, but is unaware he had a seizure.  Patient reports he has been homeless for a year and was staying with a friend up until 9 days ago.  He is currently staying at [facility].  Patient denies SI, HI and AVH.  Patient is requesting detox with a goal of going into a long term SA treatment program once he completes detox.  It appears patient has a bed at [facility] program when he completes the detox portion of treatment.    Chief Complaint:  Request for detox  Visit Diagnosis: Alcohol Use Disorder, severe  Patient Reported Information How did you hear about [place]? Other (Comment) (Case Manager with [employer])  What Is the Reason for Your Visit/Call Today? Patient presents requesting alcohol detox treatment.  Patient was seen here 2-3 times on 8/2 and each time he left before being seen.  He was rather irritable, demanding and disrespectful towards staff.  Today, he states he will "stay, I can't leave this time.  I need treatment."  He reports he has been drinking daily for the past 6 years or more.  He has been drinking a fifth and a pint daily.   He has not had any periods of sobriety during this time.  Per chart review, it appears patient has a recent seizure.  He recalls falling, but is unaware he had a seizure.  Patient denies SI, HI and AVH.  How Long Has This Been Causing You Problems? > than 6 months  What Do You Feel Would Help You the Most Today? Alcohol or Drug Use Treatment   Have You Recently Had Any Thoughts About Hurting Yourself? No  Are You Planning to Commit Suicide/Harm Yourself At This time? No   Have you Recently Had Thoughts About Hurting Someone [name]? No  Are You Planning to Harm Someone at This Time? No  Explanation: No data recorded  Have You Used Any Alcohol or Drugs in the Past 24 Hours? Yes  How Long Ago Did  You Use Drugs or Alcohol? No data recorded What Did You Use and How Much? through the day, drank a fifth and a pint of liquor   Do You Currently Have a Therapist/Psychiatrist? No  Name of Therapist/Psychiatrist: No data recorded  Have You Been Recently Discharged From Any Office Practice or Programs? No  Explanation of Discharge From Practice/Program: No data recorded   CCA Screening Triage Referral Assessment Type of Contact: Face-to-Face  Telemedicine Service Delivery:   Is  this Initial or Reassessment? No data recorded Date Telepsych consult ordered in [facility]:  No data recorded Time Telepsych consult ordered in [facility]:  No data recorded Location of Assessment: [facility]  Provider Location: [facility] Services   Collateral Involvement: N/A   Does Patient Have a Automotive engineer Guardian? No data recorded Name and Contact of Legal Guardian: No data recorded If Minor and Not Living with Parent(s), Who has Custody? No data recorded Is CPS involved or ever been involved? Never  Is APS involved or ever been involved? Never   Patient Determined To Be At Risk for Harm To Self or Others Based on Review of Patient Reported Information or Presenting Complaint? No  Method: No data recorded Availability of Means: No data recorded Intent: No data recorded Notification Required: No data recorded Additional Information for Danger to Others Potential: No data recorded Additional Comments for Danger to Others Potential: No data recorded Are There Guns or Other Weapons in Your Home? No data recorded Types of Guns/Weapons: No data recorded Are These Weapons Safely Secured?                            No data recorded Who Could Verify You Are Able To Have These Secured: No data recorded Do You Have any Outstanding Charges, Pending Court Dates, Parole/Probation? No data recorded Contacted To Inform of Risk of Harm To Self or Others: No data recorded  Does Patient Present under Involuntary Commitment? No  IVC Papers Initial File Date: No data recorded  [place] of Residence: [place]   Patient Currently Receiving the Following Services: Not Receiving Services   Determination of Need: Routine (7 days)   Options For Referral: ED Referral   Discharge Disposition:     Yetta Glassman, [facility]

## 2021-10-02 NOTE — H&P (Signed)
Date: 10/02/2021               Patient Name:  Samuel Blevins MRN: [id number]  DOB: Feb 05, 1966 Age / Sex: 56 y.o., male   PCP: [name], MD         Medical Service: Internal Medicine Teaching Service         Attending Physician: Dr. Inez Catalina, MD    First Contact: Dr. Rocky Morel Pager: [phone]  Second Contact: Dr. Thalia Bloodgood  Pager: [phone]       After Hours (After 5p/  First Contact Pager: [phone]  weekends / holidays): Second Contact Pager: [phone]   Chief Complaint: Alcohol withdrawal  History of Present Illness:  Samuel Blevins is a 56 year old male with a past medical history of alcohol abuse, pancreatitis, and pancreatic pseudocyst who presents with nausea and abdominal pain that is usually responsive to alcohol.  However today he wanted to stop drinking alcohol and initially went to behavioral health to check himself in for detox.  He was sent over to [facility] due to his history of possible alcohol withdrawal seizures.  He currently drinks over 40 ounces of vodka a day which she has been doing for the past 6 years.  Prior to that he was drinking about 16 ounces of vodka and a 12 pack of beer a day.  Chart review showed that he started drinking at the age of 60.  His last drink was at 10 PM last night.  When talking about alcohol withdrawals he denies any past history of seizures or severe withdrawals.  He states that he has not gone without a drink for more than a few hours for several years and when he does he gets shaky but otherwise denies any withdrawal episodes.  For his abdominal pain he states that he has this most mornings but that when he is able to have a drink in the morning this usually goes away.  He also describes nausea that has the same pattern.  His pain is mostly epigastric and is consistent with his prior episodes of pancreatitis.  This pain did improve after getting Ativan at behavioral health and in the ED.  When speaking about  his motivation to stop drinking alcohol this time he states that he does not want to die and that alcohol will "kill him".  He states that he has never been admitted for alcohol use or withdrawal before.  He did see his counselor yesterday and it seems that he may have had a conversation about his alcohol use that he did not go into much detail about this.  He denies any fevers, chills, chest pain, shortness of breath, presyncope, bloody or tarry stools, dysuria, hematuria.  When asked about his medicines he states he takes 7 pills in the morning, 5 at lunch, and 2 in the evening but otherwise cannot recall the medications that he is on or any medical diagnosis other than anxiety and stomach problems.  Meds:  Per chart review Acetaminophen 650 mg oral every 6 hours as needed Folic acid 1 mg oral daily Lorazepam 0.5 mg (prescribed for taper) Multivitamins Pancrelipase 36,000 units 3 times a day Sulcalfate 1 g 3 times a day Thiamine 100 mg daily   Allergies: Allergies as of 10/02/2021   (No Known Allergies)   Past Medical History:  Diagnosis Date   Alcohol withdrawal syndrome with complication (HCC)    Alcoholism (HCC)    Elevated AST (SGOT)  Gastrointestinal hemorrhage    Homeless    Hypertension    Pancreatic insufficiency    takes Creon   Symptomatic anemia 11/23/2015   Thrombocytopenia (HCC) 05/09/2021   Social History:  [facility] Not currently employed No family in the area 1/2 ppd 20 years, alcohol as per above. No other substances.   Review of Systems: A complete ROS was negative except as per HPI.   Physical Exam: Blood pressure (!) 157/95, pulse 86, temperature 98.5 F (36.9 C), temperature source Oral, resp. rate 19, SpO2 97 %. Constitutional: Elderly gentleman who appears stoic but uncomfortable laying in bed HEENT: Normocephalic with a small contusion lateral to his right eye, no sublingual jaundice, scleral icterus present Cardiovascular:  Heart with normal rate and rhythm, palpable pulses in bilateral radial and DP Pulmonary: Lungs clear to auscultation bilaterally Abdomen: Tenderness to palpation in the epigastric region, otherwise abdomen soft nontender but slightly distended with no obvious shifting dullness or fluid wave MSK: Normal bulk and tone with no appreciable lower extremity edema Skin: No palmar erythema or spider angiomata noted Neuro: Alert and oriented x3 Psych: Guarded but mood and affect are overall appropriate to situation  EKG: personally reviewed my interpretation is normal sinus rhythm with no prolonged intervals or signs of ischemia.  Assessment & Plan by Problem: Principal Problem:   Alcohol withdrawal (HCC)  Alcohol withdrawal Hypokalemia/hypomagnesemia Past history is questionable for severe alcohol withdrawal with seizures although patient denies this.  We will continue to chart review to find any evidence of the severe withdrawals.  If any evidence is found we will consider doing a Librium taper.  AST 187, ALT 65, total bilirubin 2, magnesium 1.2, potassium 2.7.  Alcohol level undetectable. - CIWA with Ativan as needed, will add Librium taper if requiring multiple as needed's or showing other signs of moderate to severe withdrawal - Thiamine and folate - Replete magnesium and potassium  Pancreatitis Transaminitis Hyperbilirubinemia Patient has a history of pancreatitis and pancreatic pseudocyst.  Reports pain that is very consistent with his prior keratitis pain.  Lipase 177 which is below his prior pancreatitis hospitalization of 900.  With history and physical consistent with pancreatitis and supportive lab value we will treat him for pancreatitis.  There were no signs of other acute inflammatory events going on at this moment but with his elevated transaminases and bilirubin we obtained a right upper quadrant ultrasound which showed diffusely increased echogenicity of the liver consistent with  steatosis but no evidence of gallbladder disease or ductal dilatation and no focal lesions - IV fluids with LR at 125 mL/hr after getting a liter bolus - Pain control with Tylenol, Dilaudid, and Zofran for nausea - Advance diet as tolerated - CT abdomen pelvis with contrast due to history of pancreatic pseudocyst  Dispo: Admit patient to Inpatient with expected length of stay greater than 2 midnights.  Signed: Rocky Morel, DO 10/02/2021, 2:41 PM  Pager: [phone] After 5pm on weekdays and 1pm on weekends: On Call pager: [phone]

## 2021-10-02 NOTE — ED Notes (Signed)
Non emergency EMS called for transport to [facility] ED. 1mg  Ativan given per provider orders.

## 2021-10-02 NOTE — ED Notes (Signed)
Pt went to bathroom, unable to give sample. Has urine cup for when able to go

## 2021-10-02 NOTE — ED Notes (Signed)
Report called to Chestine Spore, RN at [facility]. [place] non emergent EMS here to transport the patient to [facility].

## 2021-10-02 NOTE — ED Notes (Signed)
Patient transported to CT 

## 2021-10-02 NOTE — Progress Notes (Signed)
   10/02/21 0725  [facility] Triage Screening (Walk-ins at [facility] only)  What Is the Reason for Your Visit/Call Today? Patient presents requesting alcohol detox treatment.  Patient was seen here 2-3 times on 8/2 and each time he left before being seen.  He was rather irritable, demanding and disrespectful towards staff.  Today, he states he will "stay, I can't leave this time.  I need treatment."  He reports he has been drinking daily for the past 6 years or more.  He has been drinking a fifth and a pint daily.   He has not had any periods of sobriety during this time.  Per chart review, it appears patient has a recent seizure.  He recalls falling, but is unaware he had a seizure.  Patient denies SI, HI and AVH.  How Long Has This Been Causing You Problems? > than 6 months  Are You Planning to Commit Suicide/Harm Yourself At This time? No  Have you Recently Had Thoughts About Hurting Someone [name]? No  Are You Planning To Harm Someone At This Time? No  Are you currently experiencing any auditory, visual or other hallucinations? No  Have You Used Any Alcohol or Drugs in the Past 24 Hours? Yes  How long ago did you use Drugs or Alcohol? Last night aroung 10pm  What Did You Use and How Much? through the day, drank a fifth and a pint of liquor  Do you have any current medical co-morbidities that require immediate attention? Yes  Please describe current medical co-morbidities that require immediate attention: Patient's last drink was last night.  He appears to be experiencing significant withdrawal symptoms requiring ED transfer for acute withdrawal.  Clinician description of patient physical appearance/behavior: Patient is calm, cooperative AAOx5  What Do You Feel Would Help You the Most Today? Alcohol or Drug Use Treatment  If access to [facility]gent [facility] was not available, would you have sought care in the Emergency Department? No  Determination of Need Routine (7 days)  Options For Referral ED Referral

## 2021-10-02 NOTE — ED Notes (Signed)
Dinner tray ordered.

## 2021-10-02 NOTE — ED Triage Notes (Signed)
Patient BIB [facility] from [facility] for ETOH detox. Patient received 1mg  ativan while at [facility] for a reported tremor.  Patient reports drinking approximately one fifth gallon of liquor daily, last drink was 2200 last night.

## 2021-10-02 NOTE — ED Provider Notes (Addendum)
Behavioral Health Urgent Care Medical Screening Exam  Patient Name: Samuel Blevins MRN: [id number] Date of Evaluation: 10/02/21 Chief Complaint:   Diagnosis:  Final diagnoses:  Alcohol use disorder, severe, dependence (HCC)    History of Present illness: Samuel Blevins is a 56 y.o. male Samuel Blevins 56 y.o., male patient presented to [facility] as a walk in alone requesting alcohol detox.     Samuel Blevins, 56 y.o., male patient seen face to face by this provider, consulted with Dr. Lucianne Muss; and chart reviewed on 10/02/21. Per chart review patient has a history of alcohol use disorder with complication that includes alcohol withdrawal seizure and delirium. He also has a history of alcohol induced pancreatitis and gastrointestinal hemorrhage, and hematemesis.  He has no outpatient psychiatric services in place.  Reports that he does take medications for his "stomach".  He is unable to name any of the medications that he takes daily.  On evaluation Samuel Blevins reports he has been homeless for over a year.  He has been staying with a friend but the past 9 days he has been living at the [employer] [employer].  He has been drinking daily for the past 6 years.  He drinks 1/5 gallon to 1 pint of liquor daily.  He has had no periods of sobriety unless he was hospitalized.  His last drink was around 11 PM last night after he consumed roughly half a gallon of liquor.  When discussing patient's medical history he does not recall having a history of seizures however it is documented.  States roughly 3 to 4 weeks ago he was walking and he thinks he passed out he has a abrasion on his right elbow that is healing.  He denies all other substance use. Patient endorses alcohol withdrawal symptoms of headache, clamminess, nausea, abdominal pain, and a visible tremor. CIWA score 11, 1 mg of ativan given.   Patient called his Interior and spatial designer at [employer] while at facility and put her on speaker. His director from the  [facility] had recommended that he present to [facility] for medical detox.  However he was brought to [facility].  She reports that patient does have a confirmed bed at [facility] residential but he cannot present until he has gone through alcohol detox due to his history of seizures and delirium tremors.    During evaluation Samuel Blevins is alert/oriented x4 and cooperative.  He is guarded and makes minimal eye contact.  He is fairly groomed.  His speech is garbled at times but is coherent.  He is slow to respond to questions.  He denies SI/HI/AVH.  He contracts for safety.  Objectively he does not appear to be responding to internal/external stimuli.  Due to patient's history of alcohol withdrawal seizure and delirium he does not meet the criteria for treatment at the [facility] for [facility], seizures and delirium are exclusionary criteria for this facility.  Patient will need medical detox for alcohol withdrawal.  [facility] ED Dr. Rush Landmark and patient has been accepted.  He will be transported nonemergent EMS.     Psychiatric Specialty Exam  Presentation  General Appearance:Casual  Eye Contact:No data recorded Speech:Clear and Coherent; Normal Rate  Speech Volume:Normal  Handedness:Right   Mood and Affect  Mood:Anxious  Affect:Congruent   Thought Process  Thought Processes:Coherent  Descriptions of Associations:Intact  Orientation:Full (Time, Place and Person)  Thought Content:Logical    Hallucinations:None  Ideas of Reference:None  Suicidal Thoughts:No  Homicidal Thoughts:No  Sensorium  Memory:Immediate Good; Recent Good; Remote Good  Judgment:Fair  Insight:Fair   Executive Functions  Concentration:Fair  Attention Span:Fair  Recall:Fair  Fund of Knowledge:Good  Language:Good   Psychomotor Activity  Psychomotor Activity:Tremor   Assets  Assets:Communication Skills; Desire for Improvement; Physical Health; Resilience; Social  Support   Sleep  Sleep:Fair  Number of hours: No data recorded  No data recorded  Physical Exam: Physical Exam Vitals and nursing note reviewed.  Constitutional:      General: He is not in acute distress.    Appearance: Normal appearance. He is well-developed.  HENT:     Head: Normocephalic and atraumatic.  Eyes:     General:        Right eye: No discharge.        Left eye: No discharge.     Conjunctiva/sclera: Conjunctivae normal.  Cardiovascular:     Rate and Rhythm: Normal rate and regular rhythm.     Heart sounds: No murmur heard. Pulmonary:     Effort: Pulmonary effort is normal. No respiratory distress.     Breath sounds: Normal breath sounds.  Abdominal:     Palpations: Abdomen is soft.     Tenderness: There is no abdominal tenderness.  Musculoskeletal:        General: No swelling. Normal range of motion.     Cervical back: Normal range of motion and neck supple.  Skin:    General: Skin is warm and dry.     Capillary Refill: Capillary refill takes less than 2 seconds.  Neurological:     Mental Status: He is alert and oriented to person, place, and time.  Psychiatric:        Attention and Perception: Attention and perception normal.        Mood and Affect: Mood is anxious.        Speech: Speech normal.        Behavior: Behavior is slowed.        Thought Content: Thought content does not include suicidal ideation. Thought content does not include suicidal plan.        Cognition and Memory: Cognition normal.        Judgment: Judgment is impulsive.    Review of Systems  Constitutional: Negative.   HENT: Negative.    Eyes: Negative.   Respiratory: Negative.    Cardiovascular: Negative.   Gastrointestinal:  Positive for abdominal pain and nausea.  Genitourinary: Negative.   Musculoskeletal: Negative.   Skin: Negative.   Neurological:  Positive for tremors and headaches.  Psychiatric/Behavioral:  Positive for substance abuse. The patient is  nervous/anxious.    Blood pressure (!) 132/90, pulse (!) 110, temperature 98.2 F (36.8 C), temperature source Oral, resp. rate 19, SpO2 99 %. There is no height or weight on file to calculate BMI.  Musculoskeletal: Strength & Muscle Tone: within normal limits Gait & Station: normal Patient leans: N/A   [facility] MSE Discharge Disposition for Follow up and Recommendations: Based on my evaluation, patient will need to be transported to the [facility] ED for medical detox for alcohol.  Discharge patient in transfer via nonemergent EMS to [facility] ED  Dr. Rush Landmark is the accepting physician.   Per patient and his director at [employer] patient has a pending bed at [facility] residential once detox is complete.   Ardis Hughs, NP 10/02/2021, 8:40 AM

## 2021-10-02 NOTE — Discharge Instructions (Signed)
Transfer to [facility]  for medical alcohol detox

## 2021-10-02 NOTE — ED Notes (Signed)
Help get patient cleaned up patient is now back in bed on the monitor he doctors are at bedside

## 2021-10-02 NOTE — ED Notes (Signed)
Nurse tech at bedside to collect labs which were previously collected but not received by laboratory.

## 2021-10-02 NOTE — ED Provider Triage Note (Signed)
Emergency Medicine Provider Triage Evaluation Note  Samuel Blevins , a 56 y.o. male  was evaluated in triage.  Pt sent from [facility] for alcohol withdrawal.  Per [facility] they could not manage patient at their facility and so he was sent here for further evaluation, prior history of alcohol withdrawal seizures.  Patient's last drink was around 10-11 PM last night.  He typically drinks a pint of alcohol daily.  Currently reports some stomach cramping and vomiting.  He was reportedly tremulous at [facility], currently denies tremors.  No hallucinations, no chest pain or shortness of breath.  Denies any SI, HI, has not had any hallucinations.  Patient is supposed to be going to [facility] after he has detoxed  Review of Systems  Positive: Stomach cramps, nausea Negative: Tremors, hallucinations, chest pain, shortness of breath, SI, HI  Physical Exam  BP (!) 131/91 (BP Location: Right Arm)   Pulse (!) 114   Temp 99 F (37.2 C) (Oral)   Resp 14   SpO2 95%  Gen:   Awake, no distress   Resp:  Normal effort  MSK:   Moves extremities without difficulty  Other:  No tremors, patient is alert and able to answer questions, not responding to internal stimuli  Medical Decision Making  Medically screening exam initiated at 9:30 AM.  Appropriate orders placed.  Artez Regis was informed that the remainder of the evaluation will be completed by another provider, this initial triage assessment does not replace that evaluation, and the importance of remaining in the ED until their evaluation is complete.     Dartha Lodge, [place] 10/02/21 [phone]

## 2021-10-02 NOTE — ED Provider Notes (Signed)
[facility] EMERGENCY DEPARTMENT Provider Note   CSN: [id number] Arrival date & time: 10/02/21  0915     History  Chief Complaint  Patient presents with   Detox    Samuel Blevins is a 56 y.o. male.  HPI 56 year old male with a history of alcohol abuse presents with alcohol withdrawal. Patient states he drinks 1/5 to a pint of alcohol per day. Last drink was around 10 pm last night. He gets daily abdominal pain/nausea, goes away with drinking. Occurred this morning at [facility], but is now gone after ativan. States he was tremulous and feeling poorly this morning, and again resolved with ativan. No SI. Has a place reserved at [facility] whenever he is detoxed. However he is concerned high risk and was unable to stay at [facility] give seizure/withdrawal seizure history. Patient denies he's had a previous history of seizures. Vomited once this morning.   Home Medications Prior to Admission medications   Medication Sig Start Date End Date Taking? Authorizing Provider  acetaminophen (TYLENOL) 325 MG tablet Take 2 tablets (650 mg total) by mouth every 6 (six) hours as needed for fever, moderate pain, mild pain or headache. Patient not taking: Reported on 05/06/2021 12/12/20   Belva Agee, MD  folic acid (FOLVITE) 1 MG tablet Take 1 tablet (1 mg total) by mouth daily. 10/02/21   [name], MD  gabapentin (NEURONTIN) 100 MG capsule Take 1 capsule (100 mg total) by mouth 3 (three) times daily. 10/02/21   [name], MD  Iron, Ferrous Sulfate, 325 (65 Fe) MG TABS Take 1 tablet ( 325 mg) by mouth daily. 10/02/21   [name], MD  lipase/protease/amylase (CREON) 12000-38000 units CPEP capsule Take 1 capsule (12,000 Units total) by mouth 3 (three) times daily before meals. 10/02/21   [name], MD  Multiple Vitamin (MULTIVITAMIN WITH MINERALS) TABS tablet Take 1 tablet by mouth daily. 09/27/21   [name], MD  pantoprazole (PROTONIX) 40 MG tablet Take 1  tablet (40 mg total) by mouth 2 (two) times daily before a meal. 10/02/21   [name], MD  sodium chloride (OCEAN) 0.65 % SOLN nasal spray Place 2 sprays into both nostrils every 4 (four) hours while awake. 08/08/21   [name], MD  sucralfate (CARAFATE) 1 g tablet Take 1 tablet (1 g total) by mouth 4 (four) times daily -  with meals and at bedtime. 10/02/21   [name], MD  thiamine (VITAMIN B1) 100 MG tablet Take 1 tablet (100 mg total) by mouth daily. 10/02/21   [name], MD      Allergies    Patient has no known allergies.    Review of Systems   Review of Systems  Constitutional:  Negative for fever.  Gastrointestinal:  Positive for abdominal pain and vomiting.  Neurological:  Positive for tremors and headaches (this morning, gone now).    Physical Exam Updated Vital Signs BP (!) 157/95   Pulse 86   Temp 98.5 F (36.9 C) (Oral)   Resp 19   SpO2 97%  Physical Exam Vitals and nursing note reviewed.  Constitutional:      General: He is not in acute distress.    Appearance: He is well-developed. He is not ill-appearing or diaphoretic.  HENT:     Head: Normocephalic and atraumatic.  Eyes:     Extraocular Movements: Extraocular movements intact.     Pupils: Pupils are equal, round, and reactive to  light.  Cardiovascular:     Rate and Rhythm: Normal rate and regular rhythm.     Heart sounds: Normal heart sounds.  Pulmonary:     Effort: Pulmonary effort is normal.     Breath sounds: Normal breath sounds.  Abdominal:     General: There is no distension.     Palpations: Abdomen is soft.     Tenderness: There is no abdominal tenderness.  Skin:    General: Skin is warm and dry.  Neurological:     Mental Status: He is alert.     Comments: No obvious tremor currently. No hallucinations.     ED Results / Procedures / Treatments   Labs (all labs ordered are listed, but only abnormal results are displayed) Labs Reviewed  COMPREHENSIVE METABOLIC  PANEL - Abnormal; Notable for the following components:      Result Value   Potassium 2.7 (*)    Chloride 96 (*)    Glucose, Bld 103 (*)    BUN 5 (*)    Total Protein 8.2 (*)    AST 197 (*)    ALT 65 (*)    Total Bilirubin 2.0 (*)    All other components within normal limits  CBC WITH DIFFERENTIAL/PLATELET - Abnormal; Notable for the following components:   RBC 4.03 (*)    Hemoglobin 10.8 (*)    HCT 33.4 (*)    RDW 22.2 (*)    Platelets 135 (*)    All other components within normal limits  LIPASE, BLOOD - Abnormal; Notable for the following components:   Lipase 177 (*)    All other components within normal limits  MAGNESIUM - Abnormal; Notable for the following components:   Magnesium 1.2 (*)    All other components within normal limits  ETHANOL  RAPID URINE DRUG SCREEN, HOSP PERFORMED  HIV ANTIBODY (ROUTINE TESTING W REFLEX)    EKG EKG Interpretation  Date/Time:  Tuesday October 02 2021 11:09:35 EDT Ventricular Rate:  92 PR Interval:  137 QRS Duration: 102 QT Interval:  382 QTC Calculation: 473 R Axis:   84 Text Interpretation: Sinus rhythm Left atrial enlargement similar to June 2023 Confirmed by Sherwood Gambler [phone]) on 10/02/2021 11:25:35 AM  Radiology No results found.  Procedures Procedures    Medications Ordered in ED Medications  LORazepam (ATIVAN) injection 0-4 mg (1 mg Intravenous Given 10/02/21 1124)    Or  LORazepam (ATIVAN) tablet 0-4 mg ( Oral See Alternative 10/02/21 1124)  LORazepam (ATIVAN) injection 0-4 mg (has no administration in time range)    Or  LORazepam (ATIVAN) tablet 0-4 mg (has no administration in time range)  thiamine (VITAMIN B1) tablet 100 mg ( Oral See Alternative 10/02/21 1124)    Or  thiamine (VITAMIN B1) injection 100 mg (100 mg Intravenous Given 10/02/21 1124)  enoxaparin (LOVENOX) injection 40 mg (40 mg Subcutaneous Not Given 10/02/21 1446)  acetaminophen (TYLENOL) tablet 650 mg (has no administration in time range)    Or   acetaminophen (TYLENOL) suppository 650 mg (has no administration in time range)  ondansetron (ZOFRAN) tablet 4 mg (has no administration in time range)    Or  ondansetron (ZOFRAN) injection 4 mg (has no administration in time range)  potassium chloride 10 mEq in 100 mL IVPB (10 mEq Intravenous New Bag/Given 10/02/21 1516)  lactated ringers bolus 1,000 mL (0 mLs Intravenous Stopped 10/02/21 1229)  potassium chloride SA (KLOR-CON M) CR tablet 40 mEq (40 mEq Oral Given 10/02/21 1128)  potassium chloride 10  mEq in 100 mL IVPB (0 mEq Intravenous Stopped 10/02/21 1230)  magnesium sulfate IVPB 2 g 50 mL (0 g Intravenous Stopped 10/02/21 1333)    ED Course/ Medical Decision Making/ A&P                           Medical Decision Making Amount and/or Complexity of Data Reviewed External Data Reviewed: notes.    Details: [facility] notes from today Labs: ordered.    Details: LFTs consistent with alcohol abuse.  Has hypokalemia and hypomagnesemia.  Lipase is elevated but lower than previous levels. ECG/medicine tests: independent interpretation performed.    Details: No QTc prolongation  Risk Prescription drug management. Decision regarding hospitalization.   Patient is not ill-appearing here.  He is mildly tachycardic and probably in some at least mild withdrawal.  Reportedly at the behavioral health urgent care he was much sicker and with his previous history of alcohol withdrawal complications they are not comfortable with him there.  Thus he will probably need admission for detox while at the same time replating his electrolytes that are probably alcohol related.  His abdominal exam is benign and he states that it resolved with Ativan and so my suspicion that this lipase represents a true pancreatitis today, while he does have this history, is fairly low.  Admit to internal medicine teaching service.        Final Clinical Impression(s) / ED Diagnoses Final diagnoses:  Alcohol withdrawal syndrome  without complication (HCC)  Hypokalemia  Hypomagnesemia    Rx / DC Orders ED Discharge Orders     None         Pricilla Loveless, MD 10/02/21 [phone]

## 2021-10-03 ENCOUNTER — Other Ambulatory Visit (HOSPITAL_COMMUNITY): Payer: Self-pay

## 2021-10-03 DIAGNOSIS — R7401 Elevation of levels of liver transaminase levels: Secondary | ICD-10-CM

## 2021-10-03 DIAGNOSIS — F10930 Alcohol use, unspecified with withdrawal, uncomplicated: Secondary | ICD-10-CM

## 2021-10-03 DIAGNOSIS — E876 Hypokalemia: Secondary | ICD-10-CM

## 2021-10-03 LAB — HEPATIC FUNCTION PANEL
ALT: 56 U/L — ABNORMAL HIGH (ref 0–44)
AST: 158 U/L — ABNORMAL HIGH (ref 15–41)
Albumin: 3.1 g/dL — ABNORMAL LOW (ref 3.5–5.0)
Alkaline Phosphatase: 85 U/L (ref 38–126)
Bilirubin, Direct: 0.4 mg/dL — ABNORMAL HIGH (ref 0.0–0.2)
Indirect Bilirubin: 0.9 mg/dL (ref 0.3–0.9)
Total Bilirubin: 1.3 mg/dL — ABNORMAL HIGH (ref 0.3–1.2)
Total Protein: 7.1 g/dL (ref 6.5–8.1)

## 2021-10-03 LAB — BASIC METABOLIC PANEL
Anion gap: 10 (ref 5–15)
Anion gap: 10 (ref 5–15)
BUN: <5 mg/dL — ABNORMAL LOW (ref 6–20)
BUN: <5 mg/dL — ABNORMAL LOW (ref 6–20)
CO2: 25 mmol/L (ref 22–32)
CO2: 27 mmol/L (ref 22–32)
Calcium: 9.1 mg/dL (ref 8.9–10.3)
Calcium: 9.5 mg/dL (ref 8.9–10.3)
Chloride: 98 mmol/L (ref 98–111)
Chloride: 99 mmol/L (ref 98–111)
Creatinine, Ser: 0.65 mg/dL (ref 0.61–1.24)
Creatinine, Ser: 0.65 mg/dL (ref 0.61–1.24)
GFR, Estimated: >60 mL/min (ref >60)
GFR, Estimated: >60 mL/min (ref >60)
Glucose, Bld: 106 mg/dL — ABNORMAL HIGH (ref 70–99)
Glucose, Bld: 111 mg/dL — ABNORMAL HIGH (ref 70–99)
Potassium: 3.1 mmol/L — ABNORMAL LOW (ref 3.5–5.1)
Potassium: 4.2 mmol/L (ref 3.5–5.1)
Sodium: 133 mmol/L — ABNORMAL LOW (ref 135–145)
Sodium: 136 mmol/L (ref 135–145)

## 2021-10-03 LAB — MAGNESIUM
Magnesium: 1.5 mg/dL — ABNORMAL LOW (ref 1.7–2.4)
Magnesium: 1.8 mg/dL (ref 1.7–2.4)

## 2021-10-03 LAB — HIV ANTIBODY (ROUTINE TESTING W REFLEX): HIV Screen 4th Generation wRfx: NONREACTIVE

## 2021-10-03 MED ORDER — LACTATED RINGERS IV SOLN: INTRAVENOUS | Status: AC

## 2021-10-03 MED ORDER — MAGNESIUM SULFATE 2 GM/50ML IV SOLN
2.0000 g | Freq: Once | INTRAVENOUS | Status: AC
Start: 2021-10-03 — End: 2021-10-03
  Administered 2021-10-03: 2 g via INTRAVENOUS
  Filled 2021-10-03: qty 50

## 2021-10-03 MED ORDER — POTASSIUM CHLORIDE CRYS ER 20 MEQ PO TBCR
40.0000 meq | EXTENDED_RELEASE_TABLET | ORAL | Status: AC
Start: 2021-10-03 — End: 2021-10-03
  Administered 2021-10-03 (×2): 40 meq via ORAL
  Filled 2021-10-03 (×2): qty 2

## 2021-10-03 MED ORDER — PANCRELIPASE (LIP-PROT-AMYL) 12000-38000 UNITS PO CPEP
12000.0000 [IU] | ORAL_CAPSULE | Freq: Three times a day (TID) | ORAL | Status: DC
  Administered 2021-10-03 – 2021-10-07 (×12): 12000 [IU] via ORAL
  Filled 2021-10-03 (×14): qty 1

## 2021-10-03 NOTE — Progress Notes (Addendum)
HD#1 Subjective:   Summary: Samuel Blevins is a 56 year old male with a past medical history of alcohol use disorder, chronic pancreatitis, pancreatic pseudocyst, and pancreatic insufficiency who presented from [facility] for alcohol detox and had some epigastric abdominal pain with nausea who was admitted for acute on chronic pancreatitis and alcohol withdrawals.  Overnight Events: CIWA's overnight were 0 and 2 and patient did not require any as needed Ativan.  However this morning his CIWA was 11 and he received 2 mg of Ativan IV 9:30 AM.  This morning we saw the patient about an hour after he received the 2 mg as needed Ativan and he was sleeping in his room.  He woke to gentle shaking and stated that he had continued abdominal pain but that it had improved some since yesterday.  He denies any nausea or vomiting as well as any new or worsening complaints.  Objective:  Vital signs in last 24 hours: Vitals:   10/03/21 0730 10/03/21 0800 10/03/21 0900 10/03/21 0913  BP:  (!) 144/94 (!) 122/108 (!) 122/108  Pulse:  83 74 78  Resp:  12 14   Temp:      TempSrc:      SpO2: 99% 96% 97%   Weight:      Height:       Supplemental O2: Room Air SpO2: 97 %   Physical Exam:  Constitutional: well-appearing elderly gentleman laying in bed, in no acute distress HENT: normocephalic atraumatic Neck: supple Cardiovascular: regular rate and rhythm Pulmonary/Chest: normal work of breathing on room air, lungs clear to auscultation bilaterally Abdominal: soft, mild epigastric tenderness but no guarding or rigidity MSK: normal bulk and tone Neurological: alert & oriented x 3, 5/5 strength in bilateral upper and lower extremities, normal gait Skin: warm and dry   Filed Weights   10/03/21 0728  Weight: 81.2 kg     Intake/Output Summary (Last 24 hours) at 10/03/2021 1039 Last data filed at 10/03/2021 0739 Gross per 24 hour  Intake 1350.75 ml  Output 800 ml  Net 550.75 ml   Net IO Since  Admission: 550.75 mL [10/03/21 1039]  Pertinent Labs:    Latest Ref Rng & Units 10/02/2021    9:34 AM 08/09/2021   10:25 AM 08/04/2021    7:01 AM  CBC  WBC 4.0 - 10.5 K/uL 4.2  2.9  3.7   Hemoglobin 13.0 - 17.0 g/dL 73.4  7.7  7.8   Hematocrit 39.0 - 52.0 % 33.4  25.4  25.9   Platelets 150 - 400 K/uL 135  228  326        Latest Ref Rng & Units 10/03/2021    7:26 AM 10/03/2021   12:00 AM 10/02/2021    9:34 AM  CMP  Glucose 70 - 99 mg/dL 287  681  157   BUN 6 - 20 mg/dL <5  <5  5   Creatinine 0.61 - 1.24 mg/dL 2.62  0.35  5.97   Sodium 135 - 145 mmol/L 136  133  135   Potassium 3.5 - 5.1 mmol/L 4.2  3.1  2.7   Chloride 98 - 111 mmol/L 99  98  96   CO2 22 - 32 mmol/L 27  25  26    Calcium 8.9 - 10.3 mg/dL 9.5  9.1  9.3   Total Protein 6.5 - 8.1 g/dL  7.1  8.2   Total Bilirubin 0.3 - 1.2 mg/dL  1.3  2.0   Alkaline Phos 38 - 126 U/L  85  92   AST 15 - 41 U/L  158  197   ALT 0 - 44 U/L  56  65     Imaging: CT ABDOMEN PELVIS W CONTRAST  Result Date: 10/02/2021 CLINICAL DATA:  Pancreatitis, acute, severe EXAM: CT ABDOMEN AND PELVIS WITH CONTRAST TECHNIQUE: Multidetector CT imaging of the abdomen and pelvis was performed using the standard protocol following bolus administration of intravenous contrast. RADIATION DOSE REDUCTION: This exam was performed according to the departmental dose-optimization program which includes automated exposure control, adjustment of the mA and/or kV according to patient size and/or use of iterative reconstruction technique. CONTRAST:  63mL OMNIPAQUE IOHEXOL 300 MG/ML  SOLN COMPARISON:  08/03/2021 FINDINGS: Lower chest: No acute abnormality Hepatobiliary: Diffuse low-density throughout the liver compatible with fatty infiltration. No focal abnormality. Gallbladder unremarkable. Pancreas: Pancreatic head appears prominent with slight surrounding stranding suggesting pancreatitis. No ductal dilatation or focal abnormality. Spleen: No focal abnormality.  Normal size.  Adrenals/Urinary Tract: No adrenal abnormality. No focal renal abnormality. No stones or hydronephrosis. Urinary bladder is unremarkable. Stomach/Bowel: Normal appendix. Stomach, large and small bowel grossly unremarkable. Vascular/Lymphatic: Aortoiliac atherosclerosis. No evidence of aneurysm or adenopathy. Reproductive: No visible focal abnormality. Other: No free fluid or free air. Musculoskeletal: No acute bony abnormality. IMPRESSION: Prominence of the pancreatic head with slight surrounding stranding. Findings suggestive of pancreatitis. Fatty liver. Aortoiliac atherosclerosis. Electronically Signed   By: Charlett Nose M.D.   On: 10/02/2021 22:03   US Abdomen Limited RUQ (LIVER/GB)  Result Date: 10/02/2021 CLINICAL DATA:  Transaminitis EXAM: ULTRASOUND ABDOMEN LIMITED RIGHT UPPER QUADRANT COMPARISON:  CT 08/03/2021 FINDINGS: Gallbladder: No gallstones or wall thickening visualized. No sonographic Murphy sign noted by sonographer. Common bile duct: Diameter: 6.8 mm, normal. Liver: Increased echogenicity consistent with diffuse steatosis. No focal lesion or ductal dilatation. Portal vein is patent on color Doppler imaging with normal direction of blood flow towards the liver. Other: None. IMPRESSION: Diffusely increased echogenicity of the liver consistent with steatosis. No evidence of gallbladder disease or ductal dilatation. No focal lesion. Electronically Signed   By: Paulina Fusi M.D.   On: 10/02/2021 15:42    Assessment/Plan:   Principal Problem:   Alcohol withdrawal (HCC)   Patient Summary: Samuel Blevins is a 56 year old male with a past medical history of alcohol use disorder, chronic pancreatitis, pancreatic pseudocyst, and pancreatic insufficiency who presented from [facility] for alcohol detox and had some epigastric abdominal pain with nausea who was admitted for acute on chronic pancreatitis and alcohol withdrawals.   Alcohol withdrawal Hypokalemia/hypomagnesemia Past history is  questionable for severe alcohol withdrawal with seizures although patient denies this.  We will continue to chart review to find any evidence of the severe withdrawals.  If any evidence is found we will consider doing a Librium taper.   - CIWA with Ativan as needed - Thiamine and folate - Replete magnesium and potassium (repeat mag 1.8, K 4.2)  Pancreatitis Transaminitis Hyperbilirubinemia Patient has a history of pancreatitis, pancreatic pseudocyst, and pancreatic insufficiency.  He has been prescribed Creon outpatient.  No evidence of necrotic pancreatitis on right upper quadrant ultrasound or CT.  Transaminitis is improving with AST of 158 and ALT of 56 as well as improving hyperbilirubinemia with total bili down to 1.3.  Fractionation of bilirubin showed a direct hyperbilirubinemia, 0.4. - IV fluids with LR at 125 mL/hr  - Pain control with Tylenol, Dilaudid, and Zofran for nausea - Advance diet as tolerated - Restart Creon 3 times a day with meals  Diet:  Low-fat advance as tolerated IVF: LR,125cc/hr VTE: Enoxaparin Code: Full   Dispo: Anticipated discharge to Home in 3-5 days pending completion of alcohol detox and treatment of pancreatitis.   Rocky Morel DO Internal Medicine Resident PGY-1 Pager: [phone]  Please contact the on call pager after 5 pm and on weekends at [phone].

## 2021-10-03 NOTE — ED Notes (Signed)
Pt is calm and watching TV as he eats his breakfast.  Tremors are very mild and only when holding up arm.  No visual or auditory hallucinations reported.  No visible diaphoresis at this time.

## 2021-10-04 DIAGNOSIS — D500 Iron deficiency anemia secondary to blood loss (chronic): Secondary | ICD-10-CM

## 2021-10-04 DIAGNOSIS — D72819 Decreased white blood cell count, unspecified: Secondary | ICD-10-CM

## 2021-10-04 LAB — RETICULOCYTES
Immature Retic Fract: 23.3 % — ABNORMAL HIGH (ref 2.3–15.9)
RBC.: 3.89 MIL/uL — ABNORMAL LOW (ref 4.22–5.81)
Retic Count, Absolute: 96.5 10*3/uL (ref 19.0–186.0)
Retic Ct Pct: 2.5 % (ref 0.4–3.1)

## 2021-10-04 LAB — CBC
HCT: 31.3 % — ABNORMAL LOW (ref 39.0–52.0)
Hemoglobin: 10.2 g/dL — ABNORMAL LOW (ref 13.0–17.0)
MCH: 27.4 pg (ref 26.0–34.0)
MCHC: 32.6 g/dL (ref 30.0–36.0)
MCV: 84.1 fL (ref 80.0–100.0)
Platelets: 127 10*3/uL — ABNORMAL LOW (ref 150–400)
RBC: 3.72 MIL/uL — ABNORMAL LOW (ref 4.22–5.81)
RDW: 22.2 % — ABNORMAL HIGH (ref 11.5–15.5)
WBC: 3.5 10*3/uL — ABNORMAL LOW (ref 4.0–10.5)
nRBC: 0 % (ref 0.0–0.2)

## 2021-10-04 LAB — CBC WITH DIFFERENTIAL/PLATELET
Abs Immature Granulocytes: 0 10*3/uL (ref 0.00–0.07)
Basophils Absolute: 0 10*3/uL (ref 0.0–0.1)
Basophils Relative: 0 %
Eosinophils Absolute: 0.1 10*3/uL (ref 0.0–0.5)
Eosinophils Relative: 4 %
HCT: 33.4 % — ABNORMAL LOW (ref 39.0–52.0)
Hemoglobin: 10.5 g/dL — ABNORMAL LOW (ref 13.0–17.0)
Lymphocytes Relative: 13 %
Lymphs Abs: 0.4 10*3/uL — ABNORMAL LOW (ref 0.7–4.0)
MCH: 26.9 pg (ref 26.0–34.0)
MCHC: 31.4 g/dL (ref 30.0–36.0)
MCV: 85.4 fL (ref 80.0–100.0)
Monocytes Absolute: 0.2 10*3/uL (ref 0.1–1.0)
Monocytes Relative: 6 %
Neutro Abs: 2.4 10*3/uL (ref 1.7–7.7)
Neutrophils Relative %: 77 %
Platelets: 139 10*3/uL — ABNORMAL LOW (ref 150–400)
RBC: 3.91 MIL/uL — ABNORMAL LOW (ref 4.22–5.81)
RDW: 22.1 % — ABNORMAL HIGH (ref 11.5–15.5)
WBC: 3.1 10*3/uL — ABNORMAL LOW (ref 4.0–10.5)
nRBC: 0 % (ref 0.0–0.2)
nRBC: 0 /100 WBC

## 2021-10-04 LAB — COMPREHENSIVE METABOLIC PANEL
ALT: 51 U/L — ABNORMAL HIGH (ref 0–44)
AST: 105 U/L — ABNORMAL HIGH (ref 15–41)
Albumin: 2.9 g/dL — ABNORMAL LOW (ref 3.5–5.0)
Alkaline Phosphatase: 85 U/L (ref 38–126)
Anion gap: 8 (ref 5–15)
BUN: <5 mg/dL — ABNORMAL LOW (ref 6–20)
CO2: 25 mmol/L (ref 22–32)
Calcium: 9 mg/dL (ref 8.9–10.3)
Chloride: 100 mmol/L (ref 98–111)
Creatinine, Ser: 0.63 mg/dL (ref 0.61–1.24)
GFR, Estimated: >60 mL/min (ref >60)
Glucose, Bld: 107 mg/dL — ABNORMAL HIGH (ref 70–99)
Potassium: 3.1 mmol/L — ABNORMAL LOW (ref 3.5–5.1)
Sodium: 133 mmol/L — ABNORMAL LOW (ref 135–145)
Total Bilirubin: 0.8 mg/dL (ref 0.3–1.2)
Total Protein: 6.8 g/dL (ref 6.5–8.1)

## 2021-10-04 LAB — PROTIME-INR
INR: 1 (ref 0.8–1.2)
Prothrombin Time: 12.6 seconds (ref 11.4–15.2)

## 2021-10-04 LAB — TECHNOLOGIST SMEAR REVIEW

## 2021-10-04 LAB — IRON AND TIBC
Iron: 26 ug/dL — ABNORMAL LOW (ref 45–182)
Saturation Ratios: 7 % — ABNORMAL LOW (ref 17.9–39.5)
TIBC: 398 ug/dL (ref 250–450)
UIBC: 372 ug/dL

## 2021-10-04 LAB — MAGNESIUM: Magnesium: 1.5 mg/dL — ABNORMAL LOW (ref 1.7–2.4)

## 2021-10-04 MED ORDER — SENNOSIDES-DOCUSATE SODIUM 8.6-50 MG PO TABS: 1.0000 | ORAL_TABLET | Freq: Every evening | ORAL | Status: DC | PRN

## 2021-10-04 MED ORDER — NICOTINE 21 MG/24HR TD PT24
21.0000 mg | MEDICATED_PATCH | Freq: Every day | TRANSDERMAL | Status: DC
Start: 2021-10-04 — End: 2021-10-07
  Administered 2021-10-04 – 2021-10-07 (×4): 21 mg via TRANSDERMAL
  Filled 2021-10-04 (×4): qty 1

## 2021-10-04 MED ORDER — MAGNESIUM SULFATE 2 GM/50ML IV SOLN
2.0000 g | Freq: Once | INTRAVENOUS | Status: AC
Start: 2021-10-04 — End: 2021-10-04
  Administered 2021-10-04: 2 g via INTRAVENOUS
  Filled 2021-10-04: qty 50

## 2021-10-04 MED ORDER — POTASSIUM CHLORIDE CRYS ER 20 MEQ PO TBCR
40.0000 meq | EXTENDED_RELEASE_TABLET | Freq: Two times a day (BID) | ORAL | Status: AC
Start: 2021-10-04 — End: 2021-10-04
  Administered 2021-10-04 (×2): 40 meq via ORAL
  Filled 2021-10-04 (×2): qty 2

## 2021-10-04 MED ORDER — FERROUS SULFATE 325 (65 FE) MG PO TABS
325.0000 mg | ORAL_TABLET | Freq: Every day | ORAL | Status: DC
  Administered 2021-10-05 – 2021-10-07 (×3): 325 mg via ORAL
  Filled 2021-10-04 (×3): qty 1

## 2021-10-04 MED ORDER — DIAZEPAM 5 MG/ML IJ SOLN
10.0000 mg | Freq: Four times a day (QID) | INTRAMUSCULAR | Status: AC
  Administered 2021-10-04 – 2021-10-05 (×4): 10 mg via INTRAVENOUS
  Filled 2021-10-04 (×4): qty 2

## 2021-10-04 MED ORDER — DIAZEPAM 5 MG/ML IJ SOLN
10.0000 mg | Freq: Once | INTRAMUSCULAR | Status: AC
  Administered 2021-10-04: 10 mg via INTRAVENOUS
  Filled 2021-10-04: qty 2

## 2021-10-04 MED ORDER — POLYETHYLENE GLYCOL 3350 17 G PO PACK
17.0000 g | PACK | Freq: Every day | ORAL | Status: DC | PRN
Start: 2021-10-04 — End: 2021-10-07

## 2021-10-04 NOTE — Hospital Course (Addendum)
Samuel Blevins is a 56 year old male with a past medical history of alcohol use disorder, chronic pancreatitis, pancreatic pseudocyst, and pancreatic insufficiency who presented from [facility] for alcohol detox and had some epigastric abdominal pain with nausea who was admitted for acute on chronic pancreatitis and alcohol withdrawals.     Alcohol withdrawal Hypokalemia/hypomagnesemia He presented from [facility] acute alcohol cessation with a history of possible alcohol withdrawal seizures.  He did well with Valium taper and Ativan for any breakthroughs.  Over the last 3 days his CIWA's were consistently 0-2.  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  He states that he has been there before and that he liked it a lot better than [facility].  As he was finished with his detox and had a clear plan to attend rehab we discharged him with some Zofran for nausea if needed but no more benzodiazepines were needed.  Discussed naltrexone which she was amenable to and with a Child-Pugh A we were able to start him on naltrexone 50 mg daily for alcohol cravings.   Acute on chronic pancreatitis Transaminitis Hyperbilirubinemia Patient has a history of chronic pancreatitis, pancreatic pseudocyst, and pancreatic insufficiency.  Prescribed Creon outpatient.  No evidence of necrotic pancreatitis on right upper quadrant ultrasound or CT.  Transaminitis stabilized with AST of 96 and ALT of 58 and hyperbilirubinemia resolved.  He was initially given IV fluids with LR but was able to eat and drink well on his second day of admission and these IV fluids were stopped.  Pain control was adequate with Tylenol and Dilaudid and patient reported no pain after 3 days.  Continued his Creon 3 times a day with meals.   Iron deficiency anemia Leukopenia Patient has documented history of iron deficiency  anemia and labs here were concurrent with this.  Iron of 26 and a saturation ratio of 7.  Smear did not show any abnormal WBC, RBC, or platelets.  Reticulocyte index of 1.33 indicative of hypoproliferation.  PT/INR normal.  Chart review shows no prior colonoscopies although he has been recommended to get on for a few years.  Has had several upper endoscopies with the last one in 07/2021 with polyp biopsy showing inflammatory changes without evidence of dysplasia.  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  Started iron supplementation with ferrous sulfate 325 mg daily as well as a bowel regimen.  We also recommended that he follow-up outpatient to get his colonoscopy.

## 2021-10-04 NOTE — Progress Notes (Signed)
CSW received consult for ETOH use. CSW notes patient has been working with Child psychotherapist at [facility] who had him arranged to go to [facility] treatment center, but he needs to go through Detox first. Marcelino Duster B is the intake coordinator who would schedule an intake appointment for him ([phone] ext. [phone]). CSW will place info again on his AVS.  Joaquin Courts, MSW, [facility]

## 2021-10-04 NOTE — Progress Notes (Signed)
TOC following Patient is high risk for readmission. Admitted foralcohol detoxification with concern for severe withdrawal in the past who also has pancreatitis. TOC will continue to follow for needs and do substance abuse counseling.

## 2021-10-04 NOTE — Progress Notes (Signed)
HD#2 Subjective:   Summary: Samuel Blevins is a 56 year old male with a past medical history of alcohol use disorder, chronic pancreatitis, pancreatic pseudocyst, and pancreatic insufficiency who presented from [facility] for alcohol detox and had some epigastric abdominal pain with nausea who was admitted for acute on chronic pancreatitis and alcohol withdrawals.   Overnight Events: CIWA's overnight were 5, 17, 5, and 2 and he required 7 mg of as needed Ativan over the past 24 hours.   This morning he was laying in bed and reported continued abdominal pain although some mild improvement.  He has also had some episodes of nausea and vomiting last night.  He also wants a nicotine patch.  He denies any other new or worsening complaints.  Objective:  Vital signs in last 24 hours: Vitals:   10/04/21 0000 10/04/21 0448 10/04/21 0800 10/04/21 1235  BP: 137/87 (!) 141/73  124/85  Pulse: 85 82  86  Resp: 18 16  14   Temp: 98.3 F (36.8 C) 98.3 F (36.8 C) 98.5 F (36.9 C) 98.2 F (36.8 C)  TempSrc: Oral Oral Oral Oral  SpO2: 98% 96%  96%  Weight:      Height:       Supplemental O2: Room Air SpO2: 96 %   Physical Exam:  Constitutional: well-appearing elderly gentleman laying in bed, in no acute distress HENT: normocephalic atraumatic Cardiovascular: regular rate and rhythm Pulmonary/Chest: normal work of breathing on room air, lungs clear to auscultation bilaterally Abdominal: soft, mild epigastric tenderness but no guarding or rigidity MSK: normal bulk and tone Neurological: alert & oriented x 3 Skin: warm and dry  Filed Weights   10/03/21 0728  Weight: 81.2 kg     Intake/Output Summary (Last 24 hours) at 10/04/2021 1349 Last data filed at 10/04/2021 1009 Gross per 24 hour  Intake 1126.89 ml  Output 400 ml  Net 726.89 ml   Net IO Since Admission: 1,277.64 mL [10/04/21 1349]  Pertinent Labs:    Latest Ref Rng & Units 10/04/2021    8:27 AM 10/04/2021    4:06 AM 10/02/2021     9:34 AM  CBC  WBC 4.0 - 10.5 K/uL 3.1  3.5  4.2   Hemoglobin 13.0 - 17.0 g/dL 12/02/2021  16.1  09.6   Hematocrit 39.0 - 52.0 % 33.4  31.3  33.4   Platelets 150 - 400 K/uL 139  127  135        Latest Ref Rng & Units 10/04/2021    4:06 AM 10/03/2021    7:26 AM 10/03/2021   12:00 AM  CMP  Glucose 70 - 99 mg/dL 12/03/2021  409  811   BUN 6 - 20 mg/dL <5  <5  <5   Creatinine 0.61 - 1.24 mg/dL 914  7.82  9.56   Sodium 135 - 145 mmol/L 133  136  133   Potassium 3.5 - 5.1 mmol/L 3.1  4.2  3.1   Chloride 98 - 111 mmol/L 100  99  98   CO2 22 - 32 mmol/L 25  27  25    Calcium 8.9 - 10.3 mg/dL 9.0  9.5  9.1   Total Protein 6.5 - 8.1 g/dL 6.8   7.1   Total Bilirubin 0.3 - 1.2 mg/dL 0.8   1.3   Alkaline Phos 38 - 126 U/L 85   85   AST 15 - 41 U/L 105   158   ALT 0 - 44 U/L 51   56  Imaging: No results found.  Assessment/Plan:   Principal Problem:   Alcohol withdrawal (HCC)   Patient Summary: Samuel Blevins is a 56 year old male with a past medical history of alcohol use disorder, chronic pancreatitis, pancreatic pseudocyst, and pancreatic insufficiency who presented from [facility] for alcohol detox and had some epigastric abdominal pain with nausea who was admitted for acute on chronic pancreatitis and alcohol withdrawals.     Alcohol withdrawal Hypokalemia/hypomagnesemia He is on day 3 of alcohol withdrawal after having his last drink on 8/7 at 10 PM.  He is showing more signs of symptomatic alcohol withdrawal and requiring more as needed Ativan.  Mg 1.5 and K 3.1. - CIWA with Ativan as needed - Add diazepam taper - Thiamine and folate - Replete magnesium and potassium  Pancreatitis Transaminitis Hyperbilirubinemia Patient has a history of pancreatitis, pancreatic pseudocyst, and pancreatic insufficiency. He has been prescribed Creon outpatient.  No evidence of necrotic pancreatitis on right upper quadrant ultrasound or CT.  Transaminitis and hyperbilirubinemia continue to improve.  Patient is  eating and drinking well. - Hold IV fluids with LR at 125 mL/hr  - Pain control with Tylenol, Dilaudid, and Zofran for nausea - Advance diet as tolerated - Continue Creon 3 times a day with meals  Iron deficiency anemia Leukopenia Patient is discharged history of iron deficiency anemia and labs here are concurrent with this with an iron of 26 and a saturation ratio of 7.  Smear did not show any abnormal WBC, RBC, or platelets.  Reticulocyte index of 1.33 indicative of hypoproliferation.  PT/INR normal. - Ferrous sulfate 325 mg daily - MiraLAX, Senokot as needed - We will evaluate his history for proper colon cancer screening  Diet:  Low-fat IVF: None,None VTE: Enoxaparin Code: Full TOC recs: Will continue with his outpatient plan to go to [facility] treatment center after detox   Dispo: Anticipated discharge to Rehab in 4-5 days pending alcohol detox.   Rocky Morel DO Internal Medicine Resident PGY-1 Pager: [phone]  Please contact the on call pager after 5 pm and on weekends at [phone].

## 2021-10-05 LAB — COMPREHENSIVE METABOLIC PANEL
ALT: 54 U/L — ABNORMAL HIGH (ref 0–44)
AST: 106 U/L — ABNORMAL HIGH (ref 15–41)
Albumin: 3 g/dL — ABNORMAL LOW (ref 3.5–5.0)
Alkaline Phosphatase: 84 U/L (ref 38–126)
Anion gap: 8 (ref 5–15)
BUN: 6 mg/dL (ref 6–20)
CO2: 25 mmol/L (ref 22–32)
Calcium: 9.1 mg/dL (ref 8.9–10.3)
Chloride: 100 mmol/L (ref 98–111)
Creatinine, Ser: 0.73 mg/dL (ref 0.61–1.24)
GFR, Estimated: >60 mL/min (ref >60)
Glucose, Bld: 102 mg/dL — ABNORMAL HIGH (ref 70–99)
Potassium: 4.1 mmol/L (ref 3.5–5.1)
Sodium: 133 mmol/L — ABNORMAL LOW (ref 135–145)
Total Bilirubin: 0.7 mg/dL (ref 0.3–1.2)
Total Protein: 6.9 g/dL (ref 6.5–8.1)

## 2021-10-05 LAB — CBC
HCT: 31.6 % — ABNORMAL LOW (ref 39.0–52.0)
Hemoglobin: 10.2 g/dL — ABNORMAL LOW (ref 13.0–17.0)
MCH: 27.5 pg (ref 26.0–34.0)
MCHC: 32.3 g/dL (ref 30.0–36.0)
MCV: 85.2 fL (ref 80.0–100.0)
Platelets: 157 10*3/uL (ref 150–400)
RBC: 3.71 MIL/uL — ABNORMAL LOW (ref 4.22–5.81)
RDW: 22.5 % — ABNORMAL HIGH (ref 11.5–15.5)
WBC: 3.6 10*3/uL — ABNORMAL LOW (ref 4.0–10.5)
nRBC: 0 % (ref 0.0–0.2)

## 2021-10-05 LAB — MAGNESIUM: Magnesium: 1.6 mg/dL — ABNORMAL LOW (ref 1.7–2.4)

## 2021-10-05 MED ORDER — DIAZEPAM 5 MG/ML IJ SOLN
8.0000 mg | Freq: Four times a day (QID) | INTRAMUSCULAR | Status: AC
  Administered 2021-10-05 – 2021-10-06 (×3): 8 mg via INTRAVENOUS
  Filled 2021-10-05 (×4): qty 2

## 2021-10-05 MED ORDER — MAGNESIUM SULFATE 2 GM/50ML IV SOLN
2.0000 g | Freq: Once | INTRAVENOUS | Status: AC
  Administered 2021-10-05: 2 g via INTRAVENOUS
  Filled 2021-10-05: qty 50

## 2021-10-05 MED ORDER — DIAZEPAM 5 MG/ML IJ SOLN
8.0000 mg | Freq: Four times a day (QID) | INTRAMUSCULAR | Status: DC
Start: 2021-10-05 — End: 2021-10-05

## 2021-10-05 NOTE — Evaluation (Signed)
Physical Therapy Evaluation & Discharge Patient Details Name: Samuel Blevins MRN: [id number] DOB: 02-May-1965 Today's Date: 10/05/2021  History of Present Illness  Pt is a 56 y.o. male [facility] alcohol detox with epigastric pain and nausea. Workup for acute on chronic pancreatitis and ETOH withdrawal. PMH includes alcohol use disorder, chronic pancreatitis.   Clinical Impression  Patient evaluated by Physical Therapy with no further acute PT needs identified. PTA, pt mod indep with intermittent use of SPC; pt currently resides at shelter, reports plan to d/c to [facility]. Today, pt moving well, indep for mobility and ADL tasks. All education has been completed and the patient has no further questions. Acute PT is signing off. Thank you for this referral.       Recommendations for follow up therapy are one component of a multi-disciplinary discharge planning process, led by the attending physician.  Recommendations may be updated based on patient status, additional functional criteria and insurance authorization.  Follow Up Recommendations No PT follow up (pt reports plan to d/c to [facility] for ETOH rehab)      Assistance Recommended at Discharge PRN  Patient can return home with the following  Assist for transportation    Equipment Recommendations None recommended by PT  Recommendations for Other Services       Functional Status Assessment Patient has not had a recent decline in their functional status     Precautions / Restrictions Precautions Precautions: Other (comment) Precaution Comments: ETOH Restrictions Weight Bearing Restrictions: No      Mobility  Bed Mobility Overal bed mobility: Modified Independent                  Transfers Overall transfer level: Independent Equipment used: None                    Ambulation/Gait Ambulation/Gait assistance: Independent Gait Distance (Feet): 550  Feet Assistive device: None Gait Pattern/deviations: WFL(Within Functional Limits)       General Gait Details: slow, steady gait indep without overt instability or LOB  Stairs            Wheelchair Mobility    Modified Rankin (Stroke Patients Only)       Balance Overall balance assessment: Needs assistance, No apparent balance deficits (not formally assessed)   Sitting balance-Leahy Scale: Good       Standing balance-Leahy Scale: Good Standing balance comment: indep to don pants standing without UE support             High level balance activites: Side stepping, Direction changes, Turns, Sudden stops, Head turns High Level Balance Comments: no overt instability or LOB with observed higher level balance tasks             Pertinent Vitals/Pain Pain Assessment Pain Assessment: No/denies pain    Home Living Family/patient expects to be discharged to:: Shelter/Homeless                   Additional Comments: pt reports plan to d/c to [facility] for substance abuse    Prior Function Prior Level of Function : Independent/Modified Independent             Mobility Comments: Mod indep with intermittent use of SPC       Hand Dominance   Dominant Hand: Right    Extremity/Trunk Assessment   Upper Extremity Assessment Upper Extremity Assessment: Overall WFL for tasks assessed    Lower Extremity  Assessment Lower Extremity Assessment: Overall WFL for tasks assessed    Cervical / Trunk Assessment Cervical / Trunk Assessment: Normal  Communication   Communication: Expressive difficulties (speech difficult to understand at times)  Cognition Arousal/Alertness: Awake/alert Behavior During Therapy: Flat affect Overall Cognitive Status: Within Functional Limits for tasks assessed                                 General Comments: WFL for simple tasks; being observed for ETOH withdrawal. pleasant and cooperative         General Comments General comments (skin integrity, edema, etc.): pt reports plan to d/c to [facility] for ETOH rehab    Exercises     Assessment/Plan    PT Assessment Patient does not need any further PT services  PT Problem List         PT Treatment Interventions      PT Goals (Current goals can be found in the Care Plan section)  Acute Rehab PT Goals PT Goal Formulation: All assessment and education complete, DC therapy    Frequency       Co-evaluation               AM-PAC PT "6 Clicks" Mobility  Outcome Measure Help needed turning from your back to your side while in a flat bed without using bedrails?: None Help needed moving from lying on your back to sitting on the side of a flat bed without using bedrails?: None Help needed moving to and from a bed to a chair (including a wheelchair)?: None Help needed standing up from a chair using your arms (e.g., wheelchair or bedside chair)?: None Help needed to walk in hospital room?: None Help needed climbing 3-5 steps with a railing? : None 6 Click Score: 24    End of Session   Activity Tolerance: Patient tolerated treatment well Patient left: in bed;with call bell/phone within reach;with bed alarm set Nurse Communication: Mobility status PT Visit Diagnosis: Other abnormalities of gait and mobility (R26.89)    Time: 1040-1053 PT Time Calculation (min) (ACUTE ONLY): 13 min   Charges:   PT Evaluation $PT Eval Low Complexity: 1 Low        Ina Homes, PT, DPT Acute Rehabilitation Services  Personal: Secure Chat Rehab Office: [phone]  Malachy Chamber 10/05/2021, 11:55 AM

## 2021-10-05 NOTE — Evaluation (Signed)
Occupational Therapy Evaluation Patient Details Name: Samuel Blevins MRN: [id number] DOB: Jul 02, 1965 Today's Date: 10/05/2021   History of Present Illness 56 year old male with a past medical history of alcohol use disorder, chronic pancreatitis, pancreatic pseudocyst, and pancreatic insufficiency who presented from [facility] for alcohol detox and had some epigastric abdominal pain with nausea who was admitted for acute on chronic pancreatitis and alcohol withdrawals.   Clinical Impression   Patient admitted for the diagnosis above.  PTA he is homeless, and stays at a shelter.  Discharge plan is for ETOH treatment center.  Patient is showering himself, and is needing only generalized supervision for line.  Encourage OOB, mobility with staff, and completion of his own ADL.  No significant OT needs exist in the acute setting.        Recommendations for follow up therapy are one component of a multi-disciplinary discharge planning process, led by the attending physician.  Recommendations may be updated based on patient status, additional functional criteria and insurance authorization.   Follow Up Recommendations  No OT follow up    Assistance Recommended at Discharge None  Patient can return home with the following Assist for transportation    Functional Status Assessment  Patient has not had a recent decline in their functional status  Equipment Recommendations  None recommended by OT    Recommendations for Other Services       Precautions / Restrictions Precautions Precautions: Other (comment) Precaution Comments: ETOH Restrictions Weight Bearing Restrictions: No      Mobility Bed Mobility Overal bed mobility: Independent                  Transfers Overall transfer level: Modified independent                        Balance Overall balance assessment: Mild deficits observed, not formally tested                                         ADL  either performed or assessed with clinical judgement   ADL                                         General ADL Comments: setup and generalized supervision.  Has been to the shower, and walks around the room ad lib.     Vision Patient Visual Report: No change from baseline       Perception Perception Perception: Not tested   Praxis Praxis Praxis: Not tested    Pertinent Vitals/Pain Pain Assessment Pain Assessment: No/denies pain     Hand Dominance Right   Extremity/Trunk Assessment Upper Extremity Assessment Upper Extremity Assessment: Overall WFL for tasks assessed   Lower Extremity Assessment Lower Extremity Assessment: Defer to PT evaluation   Cervical / Trunk Assessment Cervical / Trunk Assessment: Normal   Communication Communication Communication: Expressive difficulties   Cognition   Behavior During Therapy: Flat affect Overall Cognitive Status: No family/caregiver present to determine baseline cognitive functioning                                 General Comments: Being observed for withdrawals.  Currenlty appropriate.     General Comments  Exercises     Shoulder Instructions      Home Living Family/patient expects to be discharged to:: Shelter/Homeless                                        Prior Functioning/Environment Prior Level of Function : Independent/Modified Independent                        OT Problem List: Decreased safety awareness      OT Treatment/Interventions:      OT Goals(Current goals can be found in the care plan section) Acute Rehab OT Goals Patient Stated Goal: Treatment center OT Goal Formulation: With patient Time For Goal Achievement: 10/08/21 Potential to Achieve Goals: Good  OT Frequency:      Co-evaluation              AM-PAC OT "6 Clicks" Daily Activity     Outcome Measure Help from another person eating meals?: None Help from another  person taking care of personal grooming?: None Help from another person toileting, which includes using toliet, bedpan, or urinal?: None Help from another person bathing (including washing, rinsing, drying)?: None Help from another person to put on and taking off regular upper body clothing?: None Help from another person to put on and taking off regular lower body clothing?: None 6 Click Score: 24   End of Session Nurse Communication: Mobility status  Activity Tolerance: Patient tolerated treatment well Patient left: in bed;with call bell/phone within reach;with bed alarm set  OT Visit Diagnosis: Other symptoms and signs involving cognitive function                Time: 0950-1011 OT Time Calculation (min): 21 min Charges:  OT General Charges $OT Visit: 1 Visit OT Evaluation $OT Eval Moderate Complexity: 1 Mod  10/05/2021  RP, OTR/L  Acute Rehabilitation Services  Office:  [phone]   Suzanna Obey 10/05/2021, 10:43 AM

## 2021-10-05 NOTE — Progress Notes (Addendum)
Pt  refused to have bed alarms on.MD informed.CIWA assessed was 2

## 2021-10-05 NOTE — Progress Notes (Signed)
HD#3 Subjective:   Summary: Samuel Blevins is a 56 year old male with a past medical history of alcohol use disorder, chronic pancreatitis, pancreatic pseudocyst, and pancreatic insufficiency who presented from [facility] for alcohol detox and had some epigastric abdominal pain with nausea who was admitted for acute on chronic pancreatitis and alcohol withdrawals.  Overnight Events: No acute events.  CIWA consistently 0, no prn Ativan needed.  Patient is doing well this morning with out any abdominal pain and no nausea since yesterday morning.  He states he had some tremors this morning but once he was given diazepam they went away and he has not had any other issues with any withdrawal symptoms in the past 24 hours.  He denies any new or worsening issues.  Objective:  Vital signs in last 24 hours: Vitals:   10/04/21 1235 10/04/21 1620 10/04/21 2240 10/05/21 0025  BP: 124/85 (!) 145/92 131/85 (!) 150/89  Pulse: 86 81 86 77  Resp: 14 17 16 14   Temp: 98.2 F (36.8 C) 98.7 F (37.1 C) 98.8 F (37.1 C) 98.4 F (36.9 C)  TempSrc: Oral Oral Oral Oral  SpO2: 96% 97% 98% 99%  Weight:      Height:       Supplemental O2: Room Air SpO2: 99 %   Physical Exam:  Constitutional: well-appearing early gentleman lying in bed, in no acute distress Cardiovascular: regular rate and rhythm Pulmonary/Chest: normal work of breathing on room air Abdominal: soft, non-tender, non-distended MSK: normal bulk and tone Neurological: alert & oriented x 3 Skin: warm and dry   Filed Weights   10/03/21 0728  Weight: 81.2 kg     Intake/Output Summary (Last 24 hours) at 10/05/2021 0729 Last data filed at 10/04/2021 1400 Gross per 24 hour  Intake 960 ml  Output 250 ml  Net 710 ml   Net IO Since Admission: 1,637.64 mL [10/05/21 0729]  Pertinent Labs:    Latest Ref Rng & Units 10/05/2021    3:23 AM 10/04/2021    8:27 AM 10/04/2021    4:06 AM  CBC  WBC 4.0 - 10.5 K/uL 3.6  3.1  3.5   Hemoglobin 13.0  - 17.0 g/dL 12/04/2021  50.5  39.7   Hematocrit 39.0 - 52.0 % 31.6  33.4  31.3   Platelets 150 - 400 K/uL 157  139  127        Latest Ref Rng & Units 10/05/2021    3:23 AM 10/04/2021    4:06 AM 10/03/2021    7:26 AM  CMP  Glucose 70 - 99 mg/dL 12/03/2021  419  379   BUN 6 - 20 mg/dL 6  <5  <5   Creatinine 0.61 - 1.24 mg/dL 024  0.97  3.53   Sodium 135 - 145 mmol/L 133  133  136   Potassium 3.5 - 5.1 mmol/L 4.1  3.1  4.2   Chloride 98 - 111 mmol/L 100  100  99   CO2 22 - 32 mmol/L 25  25  27    Calcium 8.9 - 10.3 mg/dL 9.1  9.0  9.5   Total Protein 6.5 - 8.1 g/dL 6.9  6.8    Total Bilirubin 0.3 - 1.2 mg/dL 0.7  0.8    Alkaline Phos 38 - 126 U/L 84  85    AST 15 - 41 U/L 106  105    ALT 0 - 44 U/L 54  51      Imaging: No results found.  Assessment/Plan:  Principal Problem:   Alcohol withdrawal (HCC)   Patient Summary: Samuel Blevins is a 56 year old male with a past medical history of alcohol use disorder, chronic pancreatitis, pancreatic pseudocyst, and pancreatic insufficiency who presented from [facility] for alcohol detox and had some epigastric abdominal pain with nausea who was admitted for acute on chronic pancreatitis and alcohol withdrawals.     Alcohol withdrawal Hypokalemia/hypomagnesemia He is on day 4 of alcohol withdrawal after having his last drink on 8/7 at 10 PM. Starting diazepam taper his CIWA's have been consistently 0.  Continue to monitor as we taper off diazepam.  Mg 1.6, K 4.1. - Diazepam taper, 10 mg q6 day 1, 8 mg q6 day 2, 5 mg q6 day 3, 2.5 mg q6 day 4 - CIWA with Ativan as needed - Thiamine and folate - Replete magnesium and potassium - PT/OT consult  Pancreatitis Transaminitis Hyperbilirubinemia resolved Patient has a history of pancreatitis, pancreatic pseudocyst, and pancreatic insufficiency.  Prescribed Creon outpatient.  No evidence of necrotic pancreatitis on right upper quadrant ultrasound or CT.  Transaminitis stable with AST of 106 and ALT of 54 and  hyperbilirubinemia resolved.  Patient is eating and drinking well with adequate intake off IV fluids. - Pain control with Tylenol, Dilaudid, and Zofran for nausea - Advance diet as tolerated - Continue Creon 3 times a day with meals   Iron deficiency anemia Leukopenia Patient has documented history of iron deficiency anemia and labs here are concurrent with this with an iron of 26 and a saturation ratio of 7.  Smear did not show any abnormal WBC, RBC, or platelets.  Reticulocyte index of 1.33 indicative of hypoproliferation.  PT/INR normal.  Chart review shows no prior colonoscopies although he has been recommended to get on for a few years.  Has had several upper endoscopies with the last one in 07/2021 with no polyp biopsy showing inflammatory changes without evidence of dysplasia.  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 - Ferrous sulfate 325 mg daily - MiraLAX, Senokot as needed - Recommend to get colonoscopy outpatient   Diet:  Low-fat IVF: None,None VTE: Enoxaparin Code: Full TOC recs: Will continue with his outpatient plan to go to [facility] treatment center after detox   Dispo: Anticipated discharge to  rehab  in 3 to 4 days pending continued stability alcohol withdrawal.  Rocky Morel DO Internal Medicine Resident PGY-1 Pager: [phone]  Please contact the on call pager after 5 pm and on weekends at [phone].

## 2021-10-06 DIAGNOSIS — F10939 Alcohol use, unspecified with withdrawal, unspecified: Secondary | ICD-10-CM

## 2021-10-06 DIAGNOSIS — K859 Acute pancreatitis without necrosis or infection, unspecified: Secondary | ICD-10-CM

## 2021-10-06 LAB — COMPREHENSIVE METABOLIC PANEL
ALT: 58 U/L — ABNORMAL HIGH (ref 0–44)
AST: 96 U/L — ABNORMAL HIGH (ref 15–41)
Albumin: 3.1 g/dL — ABNORMAL LOW (ref 3.5–5.0)
Alkaline Phosphatase: 85 U/L (ref 38–126)
Anion gap: 9 (ref 5–15)
BUN: 6 mg/dL (ref 6–20)
CO2: 26 mmol/L (ref 22–32)
Calcium: 9.5 mg/dL (ref 8.9–10.3)
Chloride: 102 mmol/L (ref 98–111)
Creatinine, Ser: 0.76 mg/dL (ref 0.61–1.24)
GFR, Estimated: >60 mL/min (ref >60)
Glucose, Bld: 109 mg/dL — ABNORMAL HIGH (ref 70–99)
Potassium: 4 mmol/L (ref 3.5–5.1)
Sodium: 137 mmol/L (ref 135–145)
Total Bilirubin: 0.5 mg/dL (ref 0.3–1.2)
Total Protein: 7.4 g/dL (ref 6.5–8.1)

## 2021-10-06 LAB — MAGNESIUM: Magnesium: 1.8 mg/dL (ref 1.7–2.4)

## 2021-10-06 LAB — CBC
HCT: 33.9 % — ABNORMAL LOW (ref 39.0–52.0)
Hemoglobin: 11 g/dL — ABNORMAL LOW (ref 13.0–17.0)
MCH: 27.4 pg (ref 26.0–34.0)
MCHC: 32.4 g/dL (ref 30.0–36.0)
MCV: 84.3 fL (ref 80.0–100.0)
Platelets: 182 10*3/uL (ref 150–400)
RBC: 4.02 MIL/uL — ABNORMAL LOW (ref 4.22–5.81)
RDW: 22.5 % — ABNORMAL HIGH (ref 11.5–15.5)
WBC: 3.5 10*3/uL — ABNORMAL LOW (ref 4.0–10.5)
nRBC: 0 % (ref 0.0–0.2)

## 2021-10-06 MED ORDER — DIAZEPAM 5 MG/ML IJ SOLN
5.0000 mg | Freq: Four times a day (QID) | INTRAMUSCULAR | Status: DC
  Administered 2021-10-07 (×2): 5 mg via INTRAVENOUS
  Filled 2021-10-06 (×2): qty 2

## 2021-10-06 MED ORDER — ONDANSETRON HCL 4 MG PO TABS: 4.0000 mg | ORAL_TABLET | Freq: Four times a day (QID) | ORAL | Status: DC | PRN

## 2021-10-06 MED ORDER — ONDANSETRON HCL 4 MG/2ML IJ SOLN: 4.0000 mg | Freq: Four times a day (QID) | INTRAMUSCULAR | Status: DC | PRN

## 2021-10-06 MED ORDER — ONDANSETRON HCL 4 MG/2ML IJ SOLN
4.0000 mg | Freq: Once | INTRAMUSCULAR | Status: AC
  Administered 2021-10-06: 4 mg via INTRAVENOUS
  Filled 2021-10-06: qty 2

## 2021-10-06 MED ORDER — DIAZEPAM 5 MG/ML IJ SOLN: 2.5000 mg | Freq: Four times a day (QID) | INTRAMUSCULAR | Status: DC

## 2021-10-06 NOTE — Progress Notes (Signed)
HD#4 Subjective:   Summary:  Samuel Blevins is a 56 year old male with a past medical history of alcohol use disorder, chronic pancreatitis, pancreatic pseudocyst, and pancreatic insufficiency who presented from [facility] for alcohol detox and had some epigastric abdominal pain with nausea who was admitted for acute on chronic pancreatitis and alcohol withdrawals.  Overnight Events: No acute events, CIWA 0-1 overnight, pt refused 2am dose of diazepam stating he can wait til morning.   This morning he is doing well he still does not have any abdominal pain or nausea.  He refused his 2 AM dose of diazepam to sleep more and denies any tremors, anxiety, or other withdraw symptoms since his last dose almost 12 hours ago.   Objective:  Vital signs in last 24 hours: Vitals:   10/05/21 1718 10/05/21 2007 10/06/21 0019 10/06/21 0357  BP: 126/73 (!) 160/90 (!) 144/101 (!) 144/79  Pulse: 86 86 91 89  Resp:  18 17 16   Temp:   98.7 F (37.1 C) 98.2 F (36.8 C)  TempSrc:   Oral Oral  SpO2:  95% 96% 96%  Weight:      Height:       Supplemental O2: Room Air SpO2: 96 %   Physical Exam:  Constitutional: well-appearing elderly gentleman laying in bed asleep, in no acute distress Cardiovascular: regular rate and rhythm Pulmonary/Chest: normal work of breathing on room air Abdominal: soft, non-tender, non-distended MSK: normal bulk and tone Neurological: alert & oriented x 3 Skin: warm and dry  Filed Weights   10/03/21 0728  Weight: 81.2 kg     Intake/Output Summary (Last 24 hours) at 10/06/2021 0756 Last data filed at 10/05/2021 1616 Gross per 24 hour  Intake --  Output 1300 ml  Net -1300 ml   Net IO Since Admission: 337.64 mL [10/06/21 0756]  Pertinent Labs:    Latest Ref Rng & Units 10/06/2021    3:34 AM 10/05/2021    3:23 AM 10/04/2021    8:27 AM  CBC  WBC 4.0 - 10.5 K/uL 3.5  3.6  3.1   Hemoglobin 13.0 - 17.0 g/dL 12/04/2021  95.6  38.7   Hematocrit 39.0 - 52.0 % 33.9  31.6  33.4    Platelets 150 - 400 K/uL 182  157  139        Latest Ref Rng & Units 10/06/2021    3:34 AM 10/05/2021    3:23 AM 10/04/2021    4:06 AM  CMP  Glucose 70 - 99 mg/dL 12/04/2021  332  951   BUN 6 - 20 mg/dL 6  6  <5   Creatinine 884 - 1.24 mg/dL 1.66  0.63  0.16   Sodium 135 - 145 mmol/L 137  133  133   Potassium 3.5 - 5.1 mmol/L 4.0  4.1  3.1   Chloride 98 - 111 mmol/L 102  100  100   CO2 22 - 32 mmol/L 26  25  25    Calcium 8.9 - 10.3 mg/dL 9.5  9.1  9.0   Total Protein 6.5 - 8.1 g/dL 7.4  6.9  6.8   Total Bilirubin 0.3 - 1.2 mg/dL 0.5  0.7  0.8   Alkaline Phos 38 - 126 U/L 85  84  85   AST 15 - 41 U/L 96  106  105   ALT 0 - 44 U/L 58  54  51     Imaging: No results found.  Assessment/Plan:   Principal Problem:   Alcohol  withdrawal [facility])   Patient Summary: Samuel Blevins is a 56 year old male with a past medical history of alcohol use disorder, chronic pancreatitis, pancreatic pseudocyst, and pancreatic insufficiency who presented from [facility] for alcohol detox and had some epigastric abdominal pain with nausea who was admitted for acute on chronic pancreatitis and alcohol withdrawals.     Alcohol withdrawal Hypokalemia/hypomagnesemia He is on day 5 of alcohol withdrawal after having his last drink on 8/7 at 10 PM. He has done very well on diazepam taper, day 2, and we may accelerate this as he tolerates. No prns needed in the past 24 hours. Mg 1.8, K 4.0. - Diazepam taper, 10 mg q6 day 1, 8 mg q6 day 2, 5 mg q6 day 3, 2.5 mg q6 day 4 -Changes to above regimen as 5 mg q6 for 3 doses till 0800 on 8/13, then 2.5 mg q6 for 4 doses until 0800 on 8/14 - CIWA with Ativan as needed - Thiamine and folate - Replete magnesium and potassium - PT/OT consult - Consider naltrexone as he completes his diazepam taper, MELD 3.0 score of 7 (99.5% 90-day survival)  Pancreatitis Transaminitis Hyperbilirubinemia resolved Patient has a history of pancreatitis, pancreatic pseudocyst, and pancreatic  insufficiency.  Prescribed Creon outpatient.  No evidence of necrotic pancreatitis on right upper quadrant ultrasound or CT.  Transaminitis stable with AST of 96 and ALT of 58 and hyperbilirubinemia resolved.  Patient is eating and drinking well with adequate intake off IV fluids. - Pain control with Tylenol, Dilaudid, and Zofran for nausea - Advance diet as tolerated - Continue Creon 3 times a day with meals   Iron deficiency anemia Leukopenia Patient has documented history of iron deficiency anemia and labs here are concurrent with this with an iron of 26 and a saturation ratio of 7.  Smear did not show any abnormal WBC, RBC, or platelets.  Reticulocyte index of 1.33 indicative of hypoproliferation.  PT/INR normal.  Chart review shows no prior colonoscopies although he has been recommended to get on for a few years.  Has had several upper endoscopies with the last one in 07/2021 with no polyp biopsy showing inflammatory changes without evidence of dysplasia.  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 - Ferrous sulfate 325 mg daily - MiraLAX, Senokot as needed - Recommend to get colonoscopy outpatient   Diet:  Low-fat IVF: None,None VTE: Enoxaparin Code: Full TOC recs: Will continue with his outpatient plan to contact [facility] treatment center for placement after detox, expect this to be done Sunday afternoon or Monday morning.     Dispo: Anticipated discharge to  rehab  in 1-2 days pending completion of detox and placement.  Rocky Morel DO Internal Medicine Resident PGY-1 Pager: [phone]  Please contact the on call pager after 5 pm and on weekends at [phone].

## 2021-10-07 DIAGNOSIS — E876 Hypokalemia: Secondary | ICD-10-CM

## 2021-10-07 MED ORDER — SENNOSIDES-DOCUSATE SODIUM 8.6-50 MG PO TABS
1.0000 | ORAL_TABLET | Freq: Every evening | ORAL | 0 refills | Status: DC | PRN
Start: 2021-10-07 — End: 2022-07-14

## 2021-10-07 MED ORDER — NALTREXONE HCL 50 MG PO TABS: 50.0000 mg | ORAL_TABLET | Freq: Every day | ORAL | 0 refills | Status: DC

## 2021-10-07 MED ORDER — POLYETHYLENE GLYCOL 3350 17 G PO PACK
17.0000 g | PACK | Freq: Every day | ORAL | 0 refills | Status: DC | PRN
Start: 2021-10-07 — End: 2022-07-14

## 2021-10-07 MED ORDER — ONDANSETRON HCL 4 MG PO TABS: 4.0000 mg | ORAL_TABLET | Freq: Four times a day (QID) | ORAL | 0 refills | Status: DC | PRN

## 2021-10-07 MED ORDER — MAGNESIUM SULFATE 2 GM/50ML IV SOLN
2.0000 g | Freq: Once | INTRAVENOUS | Status: DC
  Filled 2021-10-07: qty 50

## 2021-10-07 NOTE — Progress Notes (Signed)
Contact information for intake coordinator at [facility] was placed on to patient's AVS to schedule an appointment after discharge.  Edwin Dada, MSW, LCSW Transitions of Care  Clinical Social Worker II [phone]

## 2021-10-07 NOTE — Progress Notes (Signed)
Patient stated he wants to be discharged/leave, MD made aware. Pt was seen at bedside by MD, morning meds  given. Verbal order from MD to take PIV out so he can have outside privileges while awaiting for meds from TOC.

## 2021-10-07 NOTE — Discharge Instructions (Addendum)
Please call Marcelino Duster, intake coordinator at [facility] to schedule your intake appointment at [phone] ext. [phone]   You were hospitalized for alcohol withdrawal and pancreatitis. Thank you for allowing [place] to be part of your care.   Please follow up with your primary care after getting treatment at [facility].  Please note these changes made to your medications:   Please START taking:  Ferrous sulfate 325 mg daily Miralax 17 g daily as needed Senna-docusate 1 tablet as needed  Please make sure to call your doctor or come to the hospital if you experience any new or worsening withdrawal symptoms such as tremors, nausea, vomiting, or seizures. Also follow up with your rehabilitation clinic and your primary care.   Please call our clinic if you have any questions or concerns, we may be able to help and keep you from a long and expensive emergency room wait. Our clinic and after hours phone number is [phone], the best time to call is Monday through Friday 9 am to 4 pm but there is always someone available 24/7 if you have an emergency. If you need medication refills please notify your pharmacy one week in advance and they will send [place] a request.

## 2021-10-07 NOTE — Plan of Care (Signed)
  Problem: Education: Goal: Knowledge of General Education information will improve Description: Including pain rating scale, medication(s)/side effects and non-pharmacologic comfort measures Outcome: Not Progressing   Problem: Health Behavior/Discharge Planning: Goal: Ability to manage health-related needs will improve Outcome: Not Progressing   

## 2021-10-07 NOTE — Progress Notes (Signed)
Pt, is not willing to wait for meds from [facility], pt stated he will comeback tomorrow, MD made aware. AVS given and explained to patient.

## 2021-10-07 NOTE — Discharge Summary (Signed)
Name: Samuel Blevins MRN: [id number] DOB: [birth year] 56 y.o. PCP: [name], MD  Date of Admission: 10/02/2021  9:20 AM Date of Discharge: 10/07/2021 Attending Physician: Dr.  Mercie Eon, MD  Discharge Diagnosis: Principal Problem:   Alcohol withdrawal [facility]) Active Problems:   Hypokalemia   Hypomagnesemia    Discharge Medications: Allergies as of 10/07/2021   No Known Allergies      Medication List     STOP taking these medications    Pancreaze 37000-97300 units Cpep Generic drug: Pancrelipase (Lip-Prot-Amyl) Replaced by: lipase/protease/amylase 12000-38000 units Cpep capsule   sodium chloride 0.65 % Soln nasal spray Commonly known as: OCEAN       TAKE these medications    acetaminophen 325 MG tablet Commonly known as: TYLENOL Take 2 tablets (650 mg total) by mouth every 6 (six) hours as needed for fever, moderate pain, mild pain or headache.   folic acid 1 MG tablet Commonly known as: FOLVITE Take 1 tablet (1 mg total) by mouth daily.   gabapentin 100 MG capsule Commonly known as: NEURONTIN Take 1 capsule (100 mg total) by mouth 3 (three) times daily.   Iron (Ferrous Sulfate) 325 (65 Fe) MG Tabs Take 1 tablet ( 325 mg) by mouth daily.   lipase/protease/amylase 12000-38000 units Cpep capsule Commonly known as: Creon Take 1 capsule (12,000 Units total) by mouth 3 (three) times daily before meals. Replaces: Pancreaze 37000-97300 units Cpep   multivitamin with minerals Tabs tablet Take 1 tablet by mouth daily.   naltrexone 50 MG tablet Commonly known as: DEPADE Take 1 tablet (50 mg total) by mouth daily.   ondansetron 4 MG tablet Commonly known as: ZOFRAN Take 1 tablet (4 mg total) by mouth every 6 (six) hours as needed for nausea.   pantoprazole 40 MG tablet Commonly known as: PROTONIX Take 1 tablet (40 mg total) by mouth 2 (two) times daily before a meal.   polyethylene glycol 17 g packet Commonly known as: MIRALAX / GLYCOLAX Take 17  g by mouth daily as needed for mild constipation or moderate constipation.   senna-docusate 8.6-50 MG tablet Commonly known as: Senokot-S Take 1 tablet by mouth at bedtime as needed for mild constipation.   sucralfate 1 g tablet Commonly known as: Carafate Take 1 tablet (1 g total) by mouth 4 (four) times daily -  with meals and at bedtime.   thiamine 100 MG tablet Commonly known as: VITAMIN B1 Take 1 tablet (100 mg total) by mouth daily.        Disposition and follow-up:   [name] was discharged from [facility] in Good condition.  At the hospital follow up visit please address:  1.  Follow-up:  a.  Follow-up on continued management of alcohol use disorder on naltrexone and post rehab.    b.  Follow-up on his leukopenia and iron deficiency anemia.   c.  Monitor his chronic pancreatitis and pancreatic insufficiency as he is at high risk for pancreatic cancer.   d.  Follow-up on colon cancer screening with colonoscopy.  2.  Labs / imaging needed at time of follow-up: CBC, CMP  3.  Pending labs/ test needing follow-up: None  4.  Medication Changes Naltrexone 50 mg daily  Follow-up Appointments:  Follow-up Information     Services, [facility]. Call.   Why: Please call Samuel Duster B. for an intake appointment to begin alcohol treatment.  [phone]. [phone] Contact information: Samuel Blev[place] [address] [phone]  Hospital Course by problem list: Samuel Blevins is a 56 year old male with a past medical history of alcohol use disorder, chronic pancreatitis, pancreatic pseudocyst, and pancreatic insufficiency who presented from [facility] for alcohol detox and had some epigastric abdominal pain with nausea who was admitted for acute on chronic pancreatitis and alcohol withdrawals.     Alcohol withdrawal Hypokalemia/hypomagnesemia He presented from [facility] acute alcohol cessation with a history of possible  alcohol withdrawal seizures.  He did well with Valium taper and Ativan for any breakthroughs.  Over the last 3 days his CIWA's were consistently 0-2.  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  He states that he has been there before and that he liked it a lot better than [facility].  As he was finished with his detox and had a clear plan to attend rehab we discharged him with some Zofran for nausea if needed but no more benzodiazepines were needed.  Discussed naltrexone which she was amenable to and with a Child-Pugh A we were able to start him on naltrexone 50 mg daily for alcohol cravings.   Acute on chronic pancreatitis Transaminitis Hyperbilirubinemia Patient has a history of chronic pancreatitis, pancreatic pseudocyst, and pancreatic insufficiency.  Prescribed Creon outpatient.  No evidence of necrotic pancreatitis on right upper quadrant ultrasound or CT.  Transaminitis stabilized with AST of 96 and ALT of 58 and hyperbilirubinemia resolved.  He was initially given IV fluids with LR but was able to eat and drink well on his second day of admission and these IV fluids were stopped.  Pain control was adequate with Tylenol and Dilaudid and patient reported no pain after 3 days.  Continued his Creon 3 times a day with meals.   Iron deficiency anemia Leukopenia Patient has documented history of iron deficiency anemia and labs here were concurrent with this.  Iron of 26 and a saturation ratio of 7.  Smear did not show any abnormal WBC, RBC, or platelets.  Reticulocyte index of 1.33 indicative of hypoproliferation.  PT/INR normal.  Chart review shows no prior colonoscopies although he has been recommended to get on for a few years.  Has had several upper endoscopies with the last one in 07/2021 with polyp biopsy showing inflammatory changes without evidence of  dysplasia.  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  Started iron supplementation with ferrous sulfate 325 mg daily as well as a bowel regimen.  We also recommended that he follow-up outpatient to get his colonoscopy.   Discharge Subjective: Patient was doing well this morning and was already dressed in street close before we were in the room.  Denied any symptoms of withdrawal of the night even after refusing his midnight benzodiazepine.  He denies any new or worsening symptoms and continued to free from abdominal pain.  Discharge Exam:   BP 127/69 (BP Location: Right Arm)   Pulse 87   Temp 97.6 F (36.4 C) (Oral)   Resp 14   Ht 5\' 9"  (1.753 m)   Wt 81.2 kg   SpO2 93%   BMI 26.43 kg/m  Constitutional: well-appearing elderly gentleman standing in his room, in no acute distress Pulmonary/Chest: normal work of breathing on room air MSK: normal bulk and tone  Neurological: alert & oriented x 3 Skin: warm and dry  Pertinent Labs, Studies, and Procedures:     Latest Ref Rng & Units 10/06/2021    3:34 AM 10/05/2021    3:23 AM 10/04/2021    8:27 AM  CBC  WBC 4.0 - 10.5 K/uL 3.5  3.6  3.1   Hemoglobin 13.0 - 17.0 g/dL 93.2  67.1  24.5   Hematocrit 39.0 - 52.0 % 33.9  31.6  33.4   Platelets 150 - 400 K/uL 182  157  139        Latest Ref Rng & Units 10/06/2021    3:34 AM 10/05/2021    3:23 AM 10/04/2021    4:06 AM  CMP  Glucose 70 - 99 mg/dL 809  983  382   BUN 6 - 20 mg/dL 6  6  <5   Creatinine 5.05 - 1.24 mg/dL 3.97  6.73  4.19   Sodium 135 - 145 mmol/L 137  133  133   Potassium 3.5 - 5.1 mmol/L 4.0  4.1  3.1   Chloride 98 - 111 mmol/L 102  100  100   CO2 22 - 32 mmol/L 26  25  25    Calcium 8.9 - 10.3 mg/dL 9.5  9.1  9.0   Total Protein 6.5 - 8.1 g/dL 7.4  6.9  6.8   Total Bilirubin 0.3 - 1.2 mg/dL 0.5  0.7  0.8   Alkaline Phos 38 - 126 U/L 85  84  85    AST 15 - 41 U/L 96  106  105   ALT 0 - 44 U/L 58  54  51     CT ABDOMEN PELVIS W CONTRAST  Result Date: 10/02/2021 CLINICAL DATA:  Pancreatitis, acute, severe EXAM: CT ABDOMEN AND PELVIS WITH CONTRAST TECHNIQUE: Multidetector CT imaging of the abdomen and pelvis was performed using the standard protocol following bolus administration of intravenous contrast. RADIATION DOSE REDUCTION: This exam was performed according to the departmental dose-optimization program which includes automated exposure control, adjustment of the mA and/or kV according to patient size and/or use of iterative reconstruction technique. CONTRAST:  82mL OMNIPAQUE IOHEXOL 300 MG/ML  SOLN COMPARISON:  08/03/2021 FINDINGS: Lower chest: No acute abnormality Hepatobiliary: Diffuse low-density throughout the liver compatible with fatty infiltration. No focal abnormality. Gallbladder unremarkable. Pancreas: Pancreatic head appears prominent with slight surrounding stranding suggesting pancreatitis. No ductal dilatation or focal abnormality. Spleen: No focal abnormality.  Normal size. Adrenals/Urinary Tract: No adrenal abnormality. No focal renal abnormality. No stones or hydronephrosis. Urinary bladder is unremarkable. Stomach/Bowel: Normal appendix. Stomach, large and small bowel grossly unremarkable. Vascular/Lymphatic: Aortoiliac atherosclerosis. No evidence of aneurysm or adenopathy. Reproductive: No visible focal abnormality. Other: No free fluid or free air. Musculoskeletal: No acute bony abnormality. IMPRESSION: Prominence of the pancreatic head with slight surrounding stranding. Findings suggestive of pancreatitis. Fatty liver. Aortoiliac atherosclerosis. Electronically Signed   By: 10/03/2021 M.D.   On: 10/02/2021 22:03   12/02/2021 Abdomen Limited RUQ (LIVER/GB)  Result Date: 10/02/2021 CLINICAL DATA:  Transaminitis EXAM: ULTRASOUND ABDOMEN LIMITED RIGHT UPPER QUADRANT COMPARISON:  CT 08/03/2021 FINDINGS: Gallbladder: No gallstones or  wall thickening visualized. No sonographic Murphy sign noted by sonographer. Common bile duct: Diameter: 6.8 mm, normal. Liver: Increased echogenicity consistent with diffuse steatosis. No focal lesion or ductal dilatation. Portal vein is patent on color Doppler imaging with normal direction of blood flow towards the liver. Other: None. IMPRESSION: Diffusely increased echogenicity of the liver consistent with steatosis. No evidence of  gallbladder disease or ductal dilatation. No focal lesion. Electronically Signed   By: Paulina Fusi M.D.   On: 10/02/2021 15:42     Discharge Instructions: Discharge Instructions     Call MD for:  difficulty breathing, headache or visual disturbances   Complete by: As directed    Call MD for:  extreme fatigue   Complete by: As directed    Call MD for:  persistant dizziness or light-headedness   Complete by: As directed    Call MD for:  persistant nausea and vomiting   Complete by: As directed    Call MD for:  severe uncontrolled pain   Complete by: As directed    Call MD for:  temperature >100.4   Complete by: As directed    Diet - low sodium heart healthy   Complete by: As directed    Increase activity slowly   Complete by: As directed        Signed: Rocky Morel, DO 10/07/2021, 4:22 PM   Pager: [phone]   \

## 2021-10-08 ENCOUNTER — Telehealth (HOSPITAL_COMMUNITY): Payer: Self-pay | Admitting: Critical Care Medicine

## 2021-10-08 ENCOUNTER — Telehealth: Payer: Self-pay

## 2021-10-08 NOTE — Telephone Encounter (Signed)
Transition Care Management Unsuccessful Follow-up Telephone Call  Date of discharge and from where:  10/07/2021, [facility]  Attempts:  1st Attempt  Reason for unsuccessful TCM follow-up call:  Left voice message on # [phone]. Call back requested.   Need to schedule hospital follow up appointment

## 2021-10-08 NOTE — BH Assessment (Signed)
Care Management - [facility]UC Follow Up Discharges   Writer attempted to make contact with patient today and was unsuccessful.  Writer left a HIPPA compliant voice message.   Per chart review, NP Vernard Gambles reports that patient will need to be transported to the [facility] ED for medical detox for alcohol.

## 2021-10-09 ENCOUNTER — Telehealth: Payer: Self-pay

## 2021-10-09 NOTE — Telephone Encounter (Signed)
Transition Care Management Unsuccessful Follow-up Telephone Call  Date of discharge and from where:   10/07/2021, [facility]  Attempts:  2nd Attempt  Reason for unsuccessful TCM follow-up call:  Left voice message on # [phone]. Call back requested.    Need to schedule hospital follow up appointment

## 2021-10-10 ENCOUNTER — Telehealth: Payer: Self-pay

## 2021-10-10 NOTE — Telephone Encounter (Signed)
Transition Care Management Follow-up Telephone Call Date of discharge and from where: 10/07/2021, [facility] How have you been since you were released from the hospital? The patient stated he is in rehab and could not talk to me an hung up before I could ask him where he was. Samuel Blevins

## 2021-10-16 ENCOUNTER — Other Ambulatory Visit (HOSPITAL_COMMUNITY): Payer: Self-pay

## 2021-10-17 ENCOUNTER — Encounter: Payer: Self-pay | Admitting: Physician Assistant

## 2021-10-17 ENCOUNTER — Other Ambulatory Visit (HOSPITAL_COMMUNITY): Payer: Self-pay

## 2021-10-17 ENCOUNTER — Other Ambulatory Visit: Payer: Self-pay | Admitting: Critical Care Medicine

## 2021-10-17 MED ORDER — SUCRALFATE 1 G PO TABS
1.0000 g | ORAL_TABLET | Freq: Three times a day (TID) | ORAL | 0 refills | Status: DC
  Filled 2021-10-17: qty 120, 30d supply, fill #0

## 2021-10-17 MED ORDER — IRON (FERROUS SULFATE) 325 (65 FE) MG PO TABS
325.0000 mg | ORAL_TABLET | Freq: Every day | ORAL | 2 refills | Status: DC
  Filled 2021-10-17: qty 60, 60d supply, fill #0

## 2021-10-17 MED ORDER — PANTOPRAZOLE SODIUM 40 MG PO TBEC
40.0000 mg | DELAYED_RELEASE_TABLET | Freq: Two times a day (BID) | ORAL | 0 refills | Status: DC
  Filled 2021-10-17: qty 60, 30d supply, fill #0

## 2021-10-17 MED ORDER — GABAPENTIN 100 MG PO CAPS
100.0000 mg | ORAL_CAPSULE | Freq: Three times a day (TID) | ORAL | 0 refills | Status: DC
Start: 2021-10-17 — End: 2021-12-27
  Filled 2021-10-17: qty 90, 30d supply, fill #0

## 2021-10-17 MED ORDER — FOLIC ACID 1 MG PO TABS
1.0000 mg | ORAL_TABLET | Freq: Every day | ORAL | 2 refills | Status: DC
  Filled 2021-10-17: qty 60, 60d supply, fill #0

## 2021-10-17 MED ORDER — PANCRELIPASE (LIP-PROT-AMYL) 12000-38000 UNITS PO CPEP
12000.0000 [IU] | ORAL_CAPSULE | Freq: Three times a day (TID) | ORAL | 0 refills | Status: DC
  Filled 2021-10-17: qty 90, 30d supply, fill #0

## 2021-10-17 MED ORDER — NALTREXONE HCL 50 MG PO TABS
50.0000 mg | ORAL_TABLET | Freq: Every day | ORAL | 0 refills | Status: DC
  Filled 2021-10-17: qty 30, 30d supply, fill #0

## 2021-10-17 NOTE — Progress Notes (Signed)
Pt seen by Dr [name].   He did some detox but left after 4 days. They said they did not have a spot.   Lawanna Kobus was in to see him, says she will work on [facility] admit. He initially did not want to go there because he could not smoke. [place], he now agrees to go.  We will work on getting him a 30 say supply of meds.   Meds were reviewed. They had him stop the Pancrease because they were going to put him on something else.   Meds reviewed,  Has MVI and thiamine  Gabapentin is gone, he says he it was helping him sleep  Meds were sent in for him to have a 30 day supply.   He repeatedly says PW needs to fill out paperwork for rehab, he was told to f/u with Mindi Junker for this.   Theodore Demark, PA-C 10/17/2021 5:10 PM

## 2021-10-18 ENCOUNTER — Other Ambulatory Visit (HOSPITAL_COMMUNITY): Payer: Self-pay

## 2021-10-18 ENCOUNTER — Telehealth: Payer: Self-pay

## 2021-10-18 NOTE — Telephone Encounter (Signed)
I spoke to Marin Roberts, RN/ [employer] who explained that they are preparing patient to go to [facility] residential treatment tomorrow and Floydene Flock is requesting a medication list for patient. I then spoke to [facility], [employer] and she requested the med list be faxed to [facility] - fax # [phone].  Med list faxed as requested.

## 2021-10-22 ENCOUNTER — Telehealth: Payer: Self-pay

## 2021-10-22 NOTE — Telephone Encounter (Signed)
I received a call from [facility] inquiring about patient's recent alcohol use.  I instructed her to contact [employer].   I spoke to [place] this afternoon and she has spoken to Michelle/ [facility].  The patient has recent alcohol consumption but would not tell Lawanna Kobus how much he drank.  He will need to go to detox prior to residential treatment at [facility].

## 2021-10-22 NOTE — Telephone Encounter (Signed)
Not surprised.  

## 2021-10-23 DIAGNOSIS — K2920 Alcoholic gastritis without bleeding: Secondary | ICD-10-CM | POA: Insufficient documentation

## 2021-11-07 ENCOUNTER — Other Ambulatory Visit: Payer: Self-pay | Admitting: Critical Care Medicine

## 2021-11-07 ENCOUNTER — Other Ambulatory Visit: Payer: Self-pay

## 2021-11-07 ENCOUNTER — Encounter: Payer: Self-pay | Admitting: Critical Care Medicine

## 2021-11-07 MED ORDER — AMLODIPINE BESYLATE 5 MG PO TABS
5.0000 mg | ORAL_TABLET | Freq: Every day | ORAL | 0 refills | Status: DC
  Filled 2021-11-07: qty 30, 30d supply, fill #0

## 2021-11-08 NOTE — Progress Notes (Signed)
This a 56 year old male with chronic alcoholism we have been trying to get him into alcohol rehab and he fell twice.  He went to [facility] for detox then was sent to [facility] where he did not stay then he was sent to [facility] for detox and again when he arrived at the [facility] he was there for days left went to a [facility] in [place] stayed 1 day and left he claims they would let him smoke and that is why he left these rehab units.  He is now drinking about 2 airplane bottle sized amounts of alcohol vodka daily.  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

## 2021-11-21 ENCOUNTER — Telehealth: Payer: Self-pay | Admitting: Critical Care Medicine

## 2021-11-21 NOTE — Telephone Encounter (Signed)
This patient is a resident of [place] he is trying to get Social Security disability he claims the [employer] office does not have any of his medical records is there a way we can get medical records sent to the [employer] office I do not recall getting a request for records but the patient has made the request of the office

## 2021-11-22 ENCOUNTER — Other Ambulatory Visit (HOSPITAL_COMMUNITY): Payer: Self-pay

## 2021-11-29 ENCOUNTER — Other Ambulatory Visit: Payer: Self-pay | Admitting: *Deleted

## 2021-11-29 MED ORDER — LORAZEPAM 0.5 MG PO TABS: 0.5000 mg | ORAL_TABLET | Freq: Two times a day (BID) | ORAL | 1 refills | Status: DC

## 2021-11-30 ENCOUNTER — Encounter: Payer: Self-pay | Admitting: *Deleted

## 2021-11-30 NOTE — Congregational Nurse Program (Signed)
Pillbox complete 

## 2021-12-05 ENCOUNTER — Encounter: Payer: Self-pay | Admitting: Physician Assistant

## 2021-12-05 ENCOUNTER — Other Ambulatory Visit: Payer: Self-pay | Admitting: Critical Care Medicine

## 2021-12-05 ENCOUNTER — Telehealth: Payer: Self-pay | Admitting: Critical Care Medicine

## 2021-12-05 ENCOUNTER — Other Ambulatory Visit: Payer: Self-pay

## 2021-12-05 DIAGNOSIS — R269 Unspecified abnormalities of gait and mobility: Secondary | ICD-10-CM

## 2021-12-05 DIAGNOSIS — R0782 Intercostal pain: Secondary | ICD-10-CM

## 2021-12-05 DIAGNOSIS — G621 Alcoholic polyneuropathy: Secondary | ICD-10-CM

## 2021-12-05 MED ORDER — AMOXICILLIN-POT CLAVULANATE 875-125 MG PO TABS
1.0000 | ORAL_TABLET | Freq: Two times a day (BID) | ORAL | 0 refills | Status: DC
  Filled 2021-12-05: qty 20, 10d supply, fill #0

## 2021-12-05 NOTE — Progress Notes (Signed)
Pt seen by Dr Joya Gaskins.  He is worried that an area on his lower ribs is his heart. He was told that this is from where his ribs meet his sternum  He needs an Xray, but will have to go to the office. He will go on the bus, using his cane. He will need to pick up ABX when he goes for his Xray.   He is having dental pain, has an infected tooth.   He is concerned about his disability. P.W. printed out records for him, gave them to [facility]. They are probably still there, have not been given to [employer].   Vitals:   12/05/21 1617  BP: (!) 142/90  Pulse: 98  SpO2: 98%   He damaged his walker when he fell, may have had trauma to his ribs as well. He says he needs a new rollator. He had a new one in 2022, and again in 2023. He has insurance, Dr Joya Gaskins will try to get one through his insurance.   He is still drinking. He says 1 airplane a day. Says he has cut back.   Still w/ belly pain and bloody diarrhea.   Rosaria Ferries, PA-C 12/05/2021 4:21 PM

## 2021-12-05 NOTE — Telephone Encounter (Signed)
Needs rollator 

## 2021-12-06 ENCOUNTER — Ambulatory Visit
Admission: RE | Admit: 2021-12-06 | Discharge: 2021-12-06 | Disposition: A | Discharge status: Home / Self Care | Payer: Commercial Managed Care - HMO | Source: Ambulatory Visit | Attending: Critical Care Medicine | Admitting: Critical Care Medicine

## 2021-12-06 DIAGNOSIS — R0782 Intercostal pain: Secondary | ICD-10-CM

## 2021-12-06 NOTE — Telephone Encounter (Signed)
Order for rollator faxed to [employer] 

## 2021-12-08 NOTE — Progress Notes (Signed)
Let pt know his chest xray is normal. Suspect chest pain is musculoskeletal. Bonnita Nasuti give him some tylenol

## 2021-12-11 ENCOUNTER — Telehealth: Payer: Self-pay

## 2021-12-11 NOTE — Telephone Encounter (Signed)
-----   Message from [name], MD sent at 12/08/2021  7:21 AM EDT ----- Let pt know his chest xray is normal. Suspect chest pain is musculoskeletal. Bonnita Nasuti give him some tylenol

## 2021-12-11 NOTE — Telephone Encounter (Signed)
Pt was called and no vm was left due to mailbox not being set up.Information was sent to nurse pool.  

## 2021-12-17 ENCOUNTER — Emergency Department (HOSPITAL_COMMUNITY)
Admission: EM | Admit: 2021-12-17 | Discharge: 2021-12-18 | Discharge status: Against Medical Advice | Payer: Commercial Managed Care - HMO | Attending: Emergency Medicine | Admitting: Emergency Medicine

## 2021-12-17 ENCOUNTER — Other Ambulatory Visit: Payer: Self-pay

## 2021-12-17 ENCOUNTER — Encounter (HOSPITAL_COMMUNITY): Payer: Self-pay | Admitting: Emergency Medicine

## 2021-12-17 DIAGNOSIS — Z5321 Procedure and treatment not carried out due to patient leaving prior to being seen by health care provider: Secondary | ICD-10-CM | POA: Diagnosis not present

## 2021-12-17 DIAGNOSIS — R109 Unspecified abdominal pain: Secondary | ICD-10-CM | POA: Insufficient documentation

## 2021-12-17 LAB — COMPREHENSIVE METABOLIC PANEL
ALT: 38 U/L (ref 0–44)
AST: 67 U/L — ABNORMAL HIGH (ref 15–41)
Albumin: 3.5 g/dL (ref 3.5–5.0)
Alkaline Phosphatase: 85 U/L (ref 38–126)
Anion gap: 10 (ref 5–15)
BUN: 7 mg/dL (ref 6–20)
CO2: 26 mmol/L (ref 22–32)
Calcium: 8.8 mg/dL — ABNORMAL LOW (ref 8.9–10.3)
Chloride: 103 mmol/L (ref 98–111)
Creatinine, Ser: 0.63 mg/dL (ref 0.61–1.24)
GFR, Estimated: >60 mL/min (ref >60)
Glucose, Bld: 103 mg/dL — ABNORMAL HIGH (ref 70–99)
Potassium: 3.3 mmol/L — ABNORMAL LOW (ref 3.5–5.1)
Sodium: 139 mmol/L (ref 135–145)
Total Bilirubin: 0.5 mg/dL (ref 0.3–1.2)
Total Protein: 8.5 g/dL — ABNORMAL HIGH (ref 6.5–8.1)

## 2021-12-17 LAB — CBC
HCT: 35.8 % — ABNORMAL LOW (ref 39.0–52.0)
Hemoglobin: 11.8 g/dL — ABNORMAL LOW (ref 13.0–17.0)
MCH: 30.4 pg (ref 26.0–34.0)
MCHC: 33 g/dL (ref 30.0–36.0)
MCV: 92.3 fL (ref 80.0–100.0)
Platelets: 189 10*3/uL (ref 150–400)
RBC: 3.88 MIL/uL — ABNORMAL LOW (ref 4.22–5.81)
RDW: 19.7 % — ABNORMAL HIGH (ref 11.5–15.5)
WBC: 3.3 10*3/uL — ABNORMAL LOW (ref 4.0–10.5)
nRBC: 0 % (ref 0.0–0.2)

## 2021-12-17 LAB — URINALYSIS, ROUTINE W REFLEX MICROSCOPIC
Bilirubin Urine: NEGATIVE
Glucose, UA: NEGATIVE mg/dL
Hgb urine dipstick: NEGATIVE
Ketones, ur: NEGATIVE mg/dL
Leukocytes,Ua: NEGATIVE
Nitrite: POSITIVE — AB
Protein, ur: 30 mg/dL — AB
Specific Gravity, Urine: 1.009 (ref 1.005–1.030)
pH: 6 (ref 5.0–8.0)

## 2021-12-17 LAB — LIPASE, BLOOD: Lipase: 62 U/L — ABNORMAL HIGH (ref 11–51)

## 2021-12-17 NOTE — ED Triage Notes (Signed)
Per EMS- Patient was picked up from a bus stop. Patietn has a walker with his wallet in a basket.  Patient c/o abdominal pain since this AM. Patient states is chronic due to alcohol. Patient admits to drinking a pint of Vodka today which is his norm.

## 2021-12-21 ENCOUNTER — Other Ambulatory Visit: Payer: Self-pay

## 2021-12-21 ENCOUNTER — Emergency Department (HOSPITAL_COMMUNITY): Payer: Commercial Managed Care - HMO

## 2021-12-21 ENCOUNTER — Emergency Department (HOSPITAL_COMMUNITY)
Admission: EM | Admit: 2021-12-21 | Discharge: 2021-12-21 | Disposition: A | Discharge status: Home / Self Care | Payer: Commercial Managed Care - HMO | Attending: Emergency Medicine | Admitting: Emergency Medicine

## 2021-12-21 ENCOUNTER — Encounter (HOSPITAL_COMMUNITY): Payer: Self-pay | Admitting: Emergency Medicine

## 2021-12-21 DIAGNOSIS — Y908 Blood alcohol level of 240 mg/100 ml or more: Secondary | ICD-10-CM | POA: Insufficient documentation

## 2021-12-21 DIAGNOSIS — F1010 Alcohol abuse, uncomplicated: Secondary | ICD-10-CM | POA: Diagnosis not present

## 2021-12-21 DIAGNOSIS — K625 Hemorrhage of anus and rectum: Secondary | ICD-10-CM | POA: Insufficient documentation

## 2021-12-21 DIAGNOSIS — R040 Epistaxis: Secondary | ICD-10-CM | POA: Insufficient documentation

## 2021-12-21 LAB — CBC WITH DIFFERENTIAL/PLATELET
Abs Immature Granulocytes: 0 10*3/uL (ref 0.00–0.07)
Basophils Absolute: 0 10*3/uL (ref 0.0–0.1)
Basophils Relative: 1 %
Eosinophils Absolute: 0 10*3/uL (ref 0.0–0.5)
Eosinophils Relative: 1 %
HCT: 32.3 % — ABNORMAL LOW (ref 39.0–52.0)
Hemoglobin: 10.9 g/dL — ABNORMAL LOW (ref 13.0–17.0)
Immature Granulocytes: 0 %
Lymphocytes Relative: 56 %
Lymphs Abs: 1.6 10*3/uL (ref 0.7–4.0)
MCH: 30.7 pg (ref 26.0–34.0)
MCHC: 33.7 g/dL (ref 30.0–36.0)
MCV: 91 fL (ref 80.0–100.0)
Monocytes Absolute: 0.3 10*3/uL (ref 0.1–1.0)
Monocytes Relative: 9 %
Neutro Abs: 1 10*3/uL — ABNORMAL LOW (ref 1.7–7.7)
Neutrophils Relative %: 33 %
Platelets: 146 10*3/uL — ABNORMAL LOW (ref 150–400)
RBC: 3.55 MIL/uL — ABNORMAL LOW (ref 4.22–5.81)
RDW: 19.4 % — ABNORMAL HIGH (ref 11.5–15.5)
WBC: 2.9 10*3/uL — ABNORMAL LOW (ref 4.0–10.5)
nRBC: 0 % (ref 0.0–0.2)

## 2021-12-21 LAB — COMPREHENSIVE METABOLIC PANEL
ALT: 27 U/L (ref 0–44)
AST: 61 U/L — ABNORMAL HIGH (ref 15–41)
Albumin: 3.4 g/dL — ABNORMAL LOW (ref 3.5–5.0)
Alkaline Phosphatase: 91 U/L (ref 38–126)
Anion gap: 10 (ref 5–15)
BUN: 8 mg/dL (ref 6–20)
CO2: 25 mmol/L (ref 22–32)
Calcium: 8.3 mg/dL — ABNORMAL LOW (ref 8.9–10.3)
Chloride: 100 mmol/L (ref 98–111)
Creatinine, Ser: 0.58 mg/dL — ABNORMAL LOW (ref 0.61–1.24)
GFR, Estimated: >60 mL/min (ref >60)
Glucose, Bld: 96 mg/dL (ref 70–99)
Potassium: 2.9 mmol/L — ABNORMAL LOW (ref 3.5–5.1)
Sodium: 135 mmol/L (ref 135–145)
Total Bilirubin: 0.5 mg/dL (ref 0.3–1.2)
Total Protein: 8.3 g/dL — ABNORMAL HIGH (ref 6.5–8.1)

## 2021-12-21 LAB — TYPE AND SCREEN
ABO/RH(D): A POS
Antibody Screen: NEGATIVE

## 2021-12-21 LAB — ETHANOL: Alcohol, Ethyl (B): 302 mg/dL (ref <10)

## 2021-12-21 LAB — LIPASE, BLOOD: Lipase: 74 U/L — ABNORMAL HIGH (ref 11–51)

## 2021-12-21 LAB — POC OCCULT BLOOD, ED: Fecal Occult Bld: NEGATIVE

## 2021-12-21 MED ORDER — IOHEXOL 350 MG/ML SOLN
100.0000 mL | Freq: Once | INTRAVENOUS | Status: AC | PRN
  Administered 2021-12-21: 100 mL via INTRAVENOUS

## 2021-12-21 MED ORDER — POTASSIUM CHLORIDE CRYS ER 20 MEQ PO TBCR
60.0000 meq | EXTENDED_RELEASE_TABLET | Freq: Once | ORAL | Status: AC
  Administered 2021-12-21: 60 meq via ORAL
  Filled 2021-12-21: qty 3

## 2021-12-21 MED ORDER — LACTATED RINGERS IV SOLN: INTRAVENOUS | Status: DC

## 2021-12-21 MED ORDER — MORPHINE SULFATE (PF) 4 MG/ML IV SOLN
4.0000 mg | Freq: Once | INTRAVENOUS | Status: AC
  Administered 2021-12-21: 4 mg via INTRAVENOUS
  Filled 2021-12-21: qty 1

## 2021-12-21 MED ORDER — PANTOPRAZOLE SODIUM 40 MG IV SOLR
40.0000 mg | Freq: Once | INTRAVENOUS | Status: AC
  Administered 2021-12-21: 40 mg via INTRAVENOUS
  Filled 2021-12-21: qty 10

## 2021-12-21 NOTE — ED Triage Notes (Signed)
Patient presents from [facility] with complaints of a nosebleed and bright red blood from the rectum. Rectal bleeding started 2 days ago. He has been prescribed medication for BP but has not been taking it. He is currently un housed.    EMS vitals: 142/96 BP 104 HR 89 CBG 98 % SPO2 on room air

## 2021-12-21 NOTE — ED Notes (Signed)
Patient left before discharge process could be completed.  
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
## 2021-12-24 ENCOUNTER — Emergency Department (HOSPITAL_COMMUNITY)
Admission: EM | Admit: 2021-12-24 | Discharge: 2021-12-24 | Discharge status: Against Medical Advice | Payer: Commercial Managed Care - HMO | Attending: Emergency Medicine | Admitting: Emergency Medicine

## 2021-12-24 DIAGNOSIS — R1032 Left lower quadrant pain: Secondary | ICD-10-CM | POA: Diagnosis present

## 2021-12-24 DIAGNOSIS — Z5321 Procedure and treatment not carried out due to patient leaving prior to being seen by health care provider: Secondary | ICD-10-CM | POA: Insufficient documentation
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
## 2021-12-25 ENCOUNTER — Emergency Department (HOSPITAL_COMMUNITY)
Admission: EM | Admit: 2021-12-25 | Discharge: 2021-12-25 | Disposition: A | Discharge status: Home / Self Care | Payer: Commercial Managed Care - HMO | Attending: Emergency Medicine | Admitting: Emergency Medicine

## 2021-12-25 ENCOUNTER — Emergency Department (HOSPITAL_COMMUNITY): Payer: Commercial Managed Care - HMO

## 2021-12-25 ENCOUNTER — Encounter (HOSPITAL_COMMUNITY): Payer: Self-pay

## 2021-12-25 ENCOUNTER — Other Ambulatory Visit: Payer: Self-pay

## 2021-12-25 DIAGNOSIS — D649 Anemia, unspecified: Secondary | ICD-10-CM | POA: Insufficient documentation

## 2021-12-25 DIAGNOSIS — S0993XA Unspecified injury of face, initial encounter: Secondary | ICD-10-CM | POA: Diagnosis present

## 2021-12-25 DIAGNOSIS — S01511A Laceration without foreign body of lip, initial encounter: Secondary | ICD-10-CM | POA: Insufficient documentation

## 2021-12-25 DIAGNOSIS — E876 Hypokalemia: Secondary | ICD-10-CM | POA: Insufficient documentation

## 2021-12-25 DIAGNOSIS — M25521 Pain in right elbow: Secondary | ICD-10-CM | POA: Diagnosis not present

## 2021-12-25 DIAGNOSIS — Y906 Blood alcohol level of 120-199 mg/100 ml: Secondary | ICD-10-CM | POA: Diagnosis not present

## 2021-12-25 DIAGNOSIS — Z79899 Other long term (current) drug therapy: Secondary | ICD-10-CM | POA: Insufficient documentation

## 2021-12-25 DIAGNOSIS — I10 Essential (primary) hypertension: Secondary | ICD-10-CM | POA: Insufficient documentation

## 2021-12-25 DIAGNOSIS — D696 Thrombocytopenia, unspecified: Secondary | ICD-10-CM | POA: Insufficient documentation

## 2021-12-25 DIAGNOSIS — M25561 Pain in right knee: Secondary | ICD-10-CM | POA: Diagnosis not present

## 2021-12-25 DIAGNOSIS — R1084 Generalized abdominal pain: Secondary | ICD-10-CM | POA: Insufficient documentation

## 2021-12-25 DIAGNOSIS — M25562 Pain in left knee: Secondary | ICD-10-CM | POA: Insufficient documentation

## 2021-12-25 LAB — CBC
HCT: 37 % — ABNORMAL LOW (ref 39.0–52.0)
Hemoglobin: 12.2 g/dL — ABNORMAL LOW (ref 13.0–17.0)
MCH: 30.1 pg (ref 26.0–34.0)
MCHC: 33 g/dL (ref 30.0–36.0)
MCV: 91.4 fL (ref 80.0–100.0)
Platelets: 130 10*3/uL — ABNORMAL LOW (ref 150–400)
RBC: 4.05 MIL/uL — ABNORMAL LOW (ref 4.22–5.81)
RDW: 18.4 % — ABNORMAL HIGH (ref 11.5–15.5)
WBC: 5.3 10*3/uL (ref 4.0–10.5)
nRBC: 0 % (ref 0.0–0.2)

## 2021-12-25 LAB — COMPREHENSIVE METABOLIC PANEL
ALT: 27 U/L (ref 0–44)
AST: 65 U/L — ABNORMAL HIGH (ref 15–41)
Albumin: 3.7 g/dL (ref 3.5–5.0)
Alkaline Phosphatase: 104 U/L (ref 38–126)
Anion gap: 12 (ref 5–15)
BUN: 7 mg/dL (ref 6–20)
CO2: 22 mmol/L (ref 22–32)
Calcium: 8.8 mg/dL — ABNORMAL LOW (ref 8.9–10.3)
Chloride: 102 mmol/L (ref 98–111)
Creatinine, Ser: 0.32 mg/dL — ABNORMAL LOW (ref 0.61–1.24)
GFR, Estimated: >60 mL/min (ref >60)
Glucose, Bld: 80 mg/dL (ref 70–99)
Potassium: 3.1 mmol/L — ABNORMAL LOW (ref 3.5–5.1)
Sodium: 136 mmol/L (ref 135–145)
Total Bilirubin: 1.4 mg/dL — ABNORMAL HIGH (ref 0.3–1.2)
Total Protein: 8.8 g/dL — ABNORMAL HIGH (ref 6.5–8.1)

## 2021-12-25 LAB — ETHANOL: Alcohol, Ethyl (B): 121 mg/dL — ABNORMAL HIGH (ref <10)

## 2021-12-25 MED ORDER — SODIUM CHLORIDE (PF) 0.9 % IJ SOLN
INTRAMUSCULAR | Status: AC
  Filled 2021-12-25: qty 50

## 2021-12-25 MED ORDER — IOHEXOL 300 MG/ML  SOLN
100.0000 mL | Freq: Once | INTRAMUSCULAR | Status: AC | PRN
  Administered 2021-12-25: 100 mL via INTRAVENOUS

## 2021-12-25 MED ORDER — MORPHINE SULFATE (PF) 2 MG/ML IV SOLN
2.0000 mg | Freq: Once | INTRAVENOUS | Status: AC
  Administered 2021-12-25: 2 mg via INTRAVENOUS
  Filled 2021-12-25: qty 1

## 2021-12-25 MED ORDER — POTASSIUM CHLORIDE CRYS ER 20 MEQ PO TBCR
40.0000 meq | EXTENDED_RELEASE_TABLET | Freq: Once | ORAL | Status: AC
  Administered 2021-12-25: 40 meq via ORAL
  Filled 2021-12-25: qty 2

## 2021-12-25 MED ORDER — KETOROLAC TROMETHAMINE 15 MG/ML IJ SOLN
15.0000 mg | Freq: Once | INTRAMUSCULAR | Status: AC
  Administered 2021-12-25: 15 mg via INTRAVENOUS
  Filled 2021-12-25: qty 1
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
## 2021-12-26 ENCOUNTER — Other Ambulatory Visit (HOSPITAL_COMMUNITY): Payer: Self-pay

## 2021-12-26 ENCOUNTER — Other Ambulatory Visit: Payer: Self-pay | Admitting: Critical Care Medicine

## 2021-12-26 ENCOUNTER — Encounter: Payer: Self-pay | Admitting: *Deleted

## 2021-12-26 ENCOUNTER — Encounter: Payer: Self-pay | Admitting: Critical Care Medicine

## 2021-12-26 MED ORDER — AMOXICILLIN-POT CLAVULANATE 875-125 MG PO TABS
1.0000 | ORAL_TABLET | Freq: Two times a day (BID) | ORAL | 0 refills | Status: DC
  Filled 2021-12-26: qty 14, 7d supply, fill #0
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
## 2021-12-27 ENCOUNTER — Other Ambulatory Visit: Payer: Self-pay | Admitting: *Deleted

## 2021-12-27 MED ORDER — AMLODIPINE BESYLATE 5 MG PO TABS
5.0000 mg | ORAL_TABLET | Freq: Every day | ORAL | 0 refills | Status: DC
  Filled 2021-12-27: qty 30, 30d supply, fill #0

## 2021-12-27 MED ORDER — SUCRALFATE 1 G PO TABS
1.0000 g | ORAL_TABLET | Freq: Three times a day (TID) | ORAL | 0 refills | Status: DC
  Filled 2021-12-27: qty 240, 60d supply, fill #0

## 2021-12-27 MED ORDER — GABAPENTIN 100 MG PO CAPS
100.0000 mg | ORAL_CAPSULE | Freq: Three times a day (TID) | ORAL | 0 refills | Status: DC
  Filled 2021-12-27: qty 180, 60d supply, fill #0

## 2021-12-27 MED ORDER — PANCRELIPASE (LIP-PROT-AMYL) 12000-38000 UNITS PO CPEP
12000.0000 [IU] | ORAL_CAPSULE | Freq: Three times a day (TID) | ORAL | 0 refills | Status: DC
  Filled 2021-12-27: qty 90, 30d supply, fill #0

## 2021-12-27 MED ORDER — NALTREXONE HCL 50 MG PO TABS
50.0000 mg | ORAL_TABLET | Freq: Every day | ORAL | 0 refills | Status: DC
  Filled 2021-12-27: qty 30, 30d supply, fill #0

## 2021-12-27 MED ORDER — PANTOPRAZOLE SODIUM 40 MG PO TBEC
40.0000 mg | DELAYED_RELEASE_TABLET | Freq: Two times a day (BID) | ORAL | 0 refills | Status: DC
  Filled 2021-12-27: qty 60, 30d supply, fill #0
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
## 2021-12-28 ENCOUNTER — Other Ambulatory Visit (HOSPITAL_COMMUNITY): Payer: Self-pay

## 2021-12-31 ENCOUNTER — Other Ambulatory Visit (HOSPITAL_COMMUNITY): Payer: Self-pay

## 2022-01-03 ENCOUNTER — Encounter: Payer: Self-pay | Admitting: *Deleted

## 2022-01-03 NOTE — Congregational Nurse Program (Signed)
Pillbox complete 

## 2022-01-09 ENCOUNTER — Other Ambulatory Visit: Payer: Self-pay | Admitting: Critical Care Medicine

## 2022-01-09 ENCOUNTER — Encounter: Payer: Self-pay | Admitting: Physician Assistant

## 2022-01-09 ENCOUNTER — Other Ambulatory Visit (HOSPITAL_COMMUNITY): Payer: Self-pay

## 2022-01-09 MED ORDER — AZITHROMYCIN 250 MG PO TABS
ORAL_TABLET | ORAL | 0 refills | Status: AC
  Filled 2022-01-09: qty 6, 5d supply, fill #0
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
## 2022-01-10 ENCOUNTER — Encounter: Payer: Self-pay | Admitting: *Deleted

## 2022-01-10 NOTE — Congregational Nurse Program (Signed)
1st dose of azithromycin given at 1530 Given to staff and instructed on how pt shall take. Pillbox complete

## 2022-01-22 NOTE — Congregational Nurse Program (Signed)
  Dept: [phone]   Congregational Nurse Program Note  Date of Encounter: 01/22/2022  Clinic visit for complaint of decreased vision right eye from injury a few weeks ago.  BP 162/75, has not taken BP medications today and yesterday.  Pulse 90 and regular, O2 Sat 95%, reviewed medications, to see MD at [facility] tomorrow. Past Medical History: Past Medical History:  Diagnosis Date   Alcohol withdrawal syndrome with complication (HCC)    Alcoholism (HCC)    Elevated AST (SGOT)    Gastrointestinal hemorrhage    Homeless    Hypertension    Pancreatic insufficiency    takes Creon   Symptomatic anemia 11/23/2015   Thrombocytopenia (HCC) 05/09/2021    Encounter Details:  CNP Questionnaire - 01/22/22 0945       Questionnaire   Ask client: Do you give verbal consent for me to treat you today? 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    Insurance/Financial Assistance Referral N/A    Medication Have Medication Insecurities    Medical Provider Yes    Screening Referrals Made N/A    Medical Referrals Made [facility]    Medical Appointment Made N/A    Recently w/o PCP, now 1st time PCP visit completed due to CNs referral or appointment made N/A    Food Have Food Insecurities    Transportation Need transportation assistance    Housing/Utilities No permanent housing    Interpersonal Safety Do not feel safe at current residence    Interventions Advocate/Support;Educate;Counsel    Abnormal to Normal Screening Since Last CN Visit N/A    Screenings CN Performed Blood Pressure;Pulse Ox    Sent Client to Lab for: N/A    Did client attend any of the following based off CNs referral or appointments made? N/A    ED Visit Averted N/A    Life-Saving Intervention Made N/A

## 2022-01-23 ENCOUNTER — Other Ambulatory Visit: Payer: Self-pay | Admitting: *Deleted

## 2022-01-23 ENCOUNTER — Encounter: Payer: Self-pay | Admitting: Physician Assistant

## 2022-01-23 ENCOUNTER — Other Ambulatory Visit: Payer: Self-pay | Admitting: Critical Care Medicine

## 2022-01-23 ENCOUNTER — Other Ambulatory Visit (HOSPITAL_COMMUNITY): Payer: Self-pay

## 2022-01-23 MED ORDER — ONDANSETRON HCL 8 MG PO TABS
8.0000 mg | ORAL_TABLET | Freq: Four times a day (QID) | ORAL | 0 refills | Status: DC | PRN
  Filled 2022-01-23: qty 30, 10d supply, fill #0

## 2022-01-23 MED ORDER — LORAZEPAM 1 MG PO TABS
ORAL_TABLET | ORAL | 0 refills | Status: DC
  Filled 2022-01-23: qty 40, 25d supply, fill #0

## 2022-01-23 MED ORDER — GABAPENTIN 100 MG PO CAPS
100.0000 mg | ORAL_CAPSULE | Freq: Three times a day (TID) | ORAL | 0 refills | Status: DC
  Filled 2022-01-23: qty 180, 60d supply, fill #0

## 2022-01-23 NOTE — Progress Notes (Signed)
Pt seen by Dr [name].  Pt says has vomited blood 4 x today and has had bloody stools as well.   He had 1 airplane bottle this am.   He is SOB.   He has abd pain. Says he still has some pills for his stomach.  L eye can't see, but has some flashes of light. He can see some shapes, but they are blurry.  R eye has vision.   He is moving very slowly, obvious difficulty.  He needs large male pullups  Dr Dan Europe  [facility] [phone] was contacted to see him. He was given an appt at 8:45 am 11/30.  He is encouraged to take the Naltrexone 50 mg qd, but only if he is willing to stop drinking. If he is willing to try to stop drinking, take Ativan 1 mg tid x 5 days, bid x 5 days, then 1 tab daily. Zofran 8 mg added prn. Stop gabapentin.    Life expectancy < 6 months.   Theodore Demark, PA-C 01/23/2022 4:25 PM

## 2022-01-24 ENCOUNTER — Other Ambulatory Visit (HOSPITAL_COMMUNITY): Payer: Self-pay

## 2022-01-24 ENCOUNTER — Telehealth: Payer: Self-pay | Admitting: Critical Care Medicine

## 2022-01-24 DIAGNOSIS — K2970 Gastritis, unspecified, without bleeding: Secondary | ICD-10-CM

## 2022-01-24 DIAGNOSIS — K852 Alcohol induced acute pancreatitis without necrosis or infection: Secondary | ICD-10-CM

## 2022-01-24 DIAGNOSIS — K2971 Gastritis, unspecified, with bleeding: Secondary | ICD-10-CM

## 2022-01-24 DIAGNOSIS — K7010 Alcoholic hepatitis without ascites: Secondary | ICD-10-CM

## 2022-01-24 NOTE — Telephone Encounter (Signed)
This patient needs OV with me next week for labs and exam  ok to double book

## 2022-01-25 NOTE — Addendum Note (Signed)
Addended by: [name] on: 01/25/2022 03:38 PM   Modules accepted: Orders

## 2022-01-25 NOTE — Telephone Encounter (Signed)
Following week ok but has to come next week for labs. Will order

## 2022-01-25 NOTE — Telephone Encounter (Signed)
Lab entered

## 2022-01-29 NOTE — Telephone Encounter (Signed)
Called patient number and was unable to make contact or leave a voicemail.

## 2022-01-30 ENCOUNTER — Encounter (HOSPITAL_COMMUNITY): Payer: Self-pay

## 2022-01-30 ENCOUNTER — Emergency Department (HOSPITAL_COMMUNITY): Payer: Commercial Managed Care - HMO

## 2022-01-30 ENCOUNTER — Encounter: Payer: Self-pay | Admitting: Physician Assistant

## 2022-01-30 ENCOUNTER — Inpatient Hospital Stay (HOSPITAL_COMMUNITY)
Admission: EM | Admit: 2022-01-30 | Discharge: 2022-02-01 | DRG: 378 | Discharge status: Against Medical Advice | Payer: Commercial Managed Care - HMO | Attending: Internal Medicine | Admitting: Internal Medicine

## 2022-01-30 DIAGNOSIS — Y92481 Parking lot as the place of occurrence of the external cause: Secondary | ICD-10-CM

## 2022-01-30 DIAGNOSIS — F10220 Alcohol dependence with intoxication, uncomplicated: Secondary | ICD-10-CM | POA: Diagnosis present

## 2022-01-30 DIAGNOSIS — K922 Gastrointestinal hemorrhage, unspecified: Secondary | ICD-10-CM | POA: Diagnosis not present

## 2022-01-30 DIAGNOSIS — S129XXA Fracture of neck, unspecified, initial encounter: Secondary | ICD-10-CM

## 2022-01-30 DIAGNOSIS — I10 Essential (primary) hypertension: Secondary | ICD-10-CM | POA: Diagnosis present

## 2022-01-30 DIAGNOSIS — K861 Other chronic pancreatitis: Secondary | ICD-10-CM | POA: Diagnosis present

## 2022-01-30 DIAGNOSIS — E876 Hypokalemia: Secondary | ICD-10-CM | POA: Diagnosis not present

## 2022-01-30 DIAGNOSIS — Y908 Blood alcohol level of 240 mg/100 ml or more: Secondary | ICD-10-CM | POA: Diagnosis present

## 2022-01-30 DIAGNOSIS — S12601A Unspecified nondisplaced fracture of seventh cervical vertebra, initial encounter for closed fracture: Secondary | ICD-10-CM

## 2022-01-30 DIAGNOSIS — K8681 Exocrine pancreatic insufficiency: Secondary | ICD-10-CM | POA: Diagnosis present

## 2022-01-30 DIAGNOSIS — D61818 Other pancytopenia: Secondary | ICD-10-CM | POA: Diagnosis present

## 2022-01-30 DIAGNOSIS — K863 Pseudocyst of pancreas: Secondary | ICD-10-CM | POA: Diagnosis present

## 2022-01-30 DIAGNOSIS — K449 Diaphragmatic hernia without obstruction or gangrene: Secondary | ICD-10-CM | POA: Diagnosis present

## 2022-01-30 DIAGNOSIS — R7401 Elevation of levels of liver transaminase levels: Secondary | ICD-10-CM | POA: Diagnosis present

## 2022-01-30 DIAGNOSIS — K760 Fatty (change of) liver, not elsewhere classified: Secondary | ICD-10-CM | POA: Diagnosis present

## 2022-01-30 DIAGNOSIS — K625 Hemorrhage of anus and rectum: Principal | ICD-10-CM

## 2022-01-30 DIAGNOSIS — W1830XA Fall on same level, unspecified, initial encounter: Secondary | ICD-10-CM | POA: Diagnosis present

## 2022-01-30 DIAGNOSIS — F1020 Alcohol dependence, uncomplicated: Secondary | ICD-10-CM | POA: Diagnosis present

## 2022-01-30 DIAGNOSIS — D696 Thrombocytopenia, unspecified: Secondary | ICD-10-CM | POA: Diagnosis present

## 2022-01-30 DIAGNOSIS — Z79899 Other long term (current) drug therapy: Secondary | ICD-10-CM

## 2022-01-30 DIAGNOSIS — Z5900 Homelessness unspecified: Secondary | ICD-10-CM

## 2022-01-30 DIAGNOSIS — F10920 Alcohol use, unspecified with intoxication, uncomplicated: Secondary | ICD-10-CM

## 2022-01-30 LAB — CBC WITH DIFFERENTIAL/PLATELET
Abs Immature Granulocytes: 0.01 10*3/uL (ref 0.00–0.07)
Basophils Absolute: 0 10*3/uL (ref 0.0–0.1)
Basophils Relative: 1 %
Eosinophils Absolute: 0 10*3/uL (ref 0.0–0.5)
Eosinophils Relative: 0 %
HCT: 38.8 % — ABNORMAL LOW (ref 39.0–52.0)
Hemoglobin: 12.4 g/dL — ABNORMAL LOW (ref 13.0–17.0)
Immature Granulocytes: 0 %
Lymphocytes Relative: 31 %
Lymphs Abs: 1.2 10*3/uL (ref 0.7–4.0)
MCH: 30.4 pg (ref 26.0–34.0)
MCHC: 32 g/dL (ref 30.0–36.0)
MCV: 95.1 fL (ref 80.0–100.0)
Monocytes Absolute: 0.3 10*3/uL (ref 0.1–1.0)
Monocytes Relative: 8 %
Neutro Abs: 2.3 10*3/uL (ref 1.7–7.7)
Neutrophils Relative %: 60 %
Platelets: 147 10*3/uL — ABNORMAL LOW (ref 150–400)
RBC: 4.08 MIL/uL — ABNORMAL LOW (ref 4.22–5.81)
RDW: 16.8 % — ABNORMAL HIGH (ref 11.5–15.5)
WBC: 3.8 10*3/uL — ABNORMAL LOW (ref 4.0–10.5)
nRBC: 0 % (ref 0.0–0.2)

## 2022-01-30 LAB — COMPREHENSIVE METABOLIC PANEL
ALT: 32 U/L (ref 0–44)
AST: 114 U/L — ABNORMAL HIGH (ref 15–41)
Albumin: 3.7 g/dL (ref 3.5–5.0)
Alkaline Phosphatase: 98 U/L (ref 38–126)
Anion gap: 14 (ref 5–15)
BUN: 6 mg/dL (ref 6–20)
CO2: 22 mmol/L (ref 22–32)
Calcium: 8.8 mg/dL — ABNORMAL LOW (ref 8.9–10.3)
Chloride: 103 mmol/L (ref 98–111)
Creatinine, Ser: 0.56 mg/dL — ABNORMAL LOW (ref 0.61–1.24)
GFR, Estimated: >60 mL/min (ref >60)
Glucose, Bld: 87 mg/dL (ref 70–99)
Potassium: 2.8 mmol/L — ABNORMAL LOW (ref 3.5–5.1)
Sodium: 139 mmol/L (ref 135–145)
Total Bilirubin: 1 mg/dL (ref 0.3–1.2)
Total Protein: 8.5 g/dL — ABNORMAL HIGH (ref 6.5–8.1)

## 2022-01-30 LAB — AMMONIA: Ammonia: 36 umol/L — ABNORMAL HIGH (ref 9–35)

## 2022-01-30 LAB — TROPONIN I (HIGH SENSITIVITY): Troponin I (High Sensitivity): 11 ng/L (ref <18)

## 2022-01-30 LAB — LIPASE, BLOOD: Lipase: 54 U/L — ABNORMAL HIGH (ref 11–51)

## 2022-01-30 LAB — TYPE AND SCREEN
ABO/RH(D): A POS
Antibody Screen: NEGATIVE

## 2022-01-30 LAB — PROTIME-INR
INR: 1 (ref 0.8–1.2)
Prothrombin Time: 13 seconds (ref 11.4–15.2)

## 2022-01-30 LAB — ETHANOL: Alcohol, Ethyl (B): 360 mg/dL (ref <10)

## 2022-01-30 LAB — CBG MONITORING, ED: Glucose-Capillary: 88 mg/dL (ref 70–99)

## 2022-01-30 MED ORDER — LORAZEPAM 1 MG PO TABS: 1.0000 mg | ORAL_TABLET | ORAL | Status: DC | PRN

## 2022-01-30 MED ORDER — PANTOPRAZOLE SODIUM 40 MG IV SOLR
40.0000 mg | Freq: Once | INTRAVENOUS | Status: AC
  Administered 2022-01-30: 40 mg via INTRAVENOUS
  Filled 2022-01-30: qty 10

## 2022-01-30 MED ORDER — SODIUM CHLORIDE 0.9 % IV SOLN: INTRAVENOUS | Status: AC

## 2022-01-30 MED ORDER — IOHEXOL 350 MG/ML SOLN
100.0000 mL | Freq: Once | INTRAVENOUS | Status: AC | PRN
  Administered 2022-01-30: 100 mL via INTRAVENOUS

## 2022-01-30 MED ORDER — SODIUM CHLORIDE 0.9 % IV BOLUS
1000.0000 mL | Freq: Once | INTRAVENOUS | Status: AC
  Administered 2022-01-30: 1000 mL via INTRAVENOUS

## 2022-01-30 MED ORDER — POTASSIUM CHLORIDE 10 MEQ/100ML IV SOLN
10.0000 meq | INTRAVENOUS | Status: DC
  Filled 2022-01-30 (×5): qty 100

## 2022-01-30 MED ORDER — THIAMINE HCL 100 MG/ML IJ SOLN
100.0000 mg | Freq: Every day | INTRAMUSCULAR | Status: DC
  Administered 2022-02-01: 100 mg via INTRAVENOUS
  Filled 2022-01-30: qty 2

## 2022-01-30 MED ORDER — LORAZEPAM 2 MG/ML IJ SOLN
1.0000 mg | INTRAMUSCULAR | Status: DC | PRN
  Administered 2022-01-31: 1 mg via INTRAVENOUS
  Administered 2022-01-31 (×2): 4 mg via INTRAVENOUS
  Administered 2022-01-31: 3 mg via INTRAVENOUS
  Administered 2022-01-31: 4 mg via INTRAVENOUS
  Administered 2022-01-31 – 2022-02-01 (×3): 2 mg via INTRAVENOUS
  Filled 2022-01-30 (×2): qty 1
  Filled 2022-01-30 (×2): qty 2
  Filled 2022-01-30: qty 1
  Filled 2022-01-30: qty 2
  Filled 2022-01-30: qty 1
  Filled 2022-01-30 (×3): qty 2

## 2022-01-30 MED ORDER — PANTOPRAZOLE SODIUM 40 MG IV SOLR
40.0000 mg | Freq: Two times a day (BID) | INTRAVENOUS | Status: DC
  Administered 2022-01-31 – 2022-02-01 (×3): 40 mg via INTRAVENOUS
  Filled 2022-01-30 (×3): qty 10

## 2022-01-30 MED ORDER — FOLIC ACID 1 MG PO TABS
1.0000 mg | ORAL_TABLET | Freq: Every day | ORAL | Status: DC
  Administered 2022-01-30 – 2022-01-31 (×2): 1 mg via ORAL
  Filled 2022-01-30 (×3): qty 1

## 2022-01-30 MED ORDER — THIAMINE MONONITRATE 100 MG PO TABS
100.0000 mg | ORAL_TABLET | Freq: Every day | ORAL | Status: DC
  Administered 2022-01-30 – 2022-01-31 (×2): 100 mg via ORAL
  Filled 2022-01-30 (×2): qty 1

## 2022-01-30 MED ORDER — POTASSIUM CHLORIDE 10 MEQ/100ML IV SOLN
10.0000 meq | INTRAVENOUS | Status: AC
  Administered 2022-01-30 – 2022-01-31 (×3): 10 meq via INTRAVENOUS
  Filled 2022-01-30 (×2): qty 100

## 2022-01-30 MED ORDER — ADULT MULTIVITAMIN W/MINERALS CH
1.0000 | ORAL_TABLET | Freq: Every day | ORAL | Status: DC
  Administered 2022-01-30 – 2022-01-31 (×2): 1 via ORAL
  Filled 2022-01-30 (×3): qty 1

## 2022-01-30 NOTE — H&P (Signed)
History and Physical    Liem Copenhaver ELM:[id number] DOB: February 11, 1966 DOA: 01/30/2022  PCP: [name], MD  Chief Complaint: Fall  HPI: Samuel Blevins is a 56 y.o. male with medical history significant of alcoholism, chronic pancreatitis, pancreatic pseudocyst, chronic pancreatic insufficiency, hypertension, chronic anemia and thrombocytopenia, homelessness presented to the ED after a fall.  Patient was intoxicated on arrival.  Reportedly had bloody emesis and rectal bleeding.  Last alcoholic drink was earlier today.  He apparently fell and struck his face and was having bleeding from his nose and mouth on arrival to the ED.  Not on anticoagulation or antiplatelet agents.  On arrival to the ED, patient was slightly tachycardic.  Labs showing WBC 3.8, hemoglobin 12.4, platelet count 147k, potassium 2.8, AST 114 and remainder of LFTs normal, INR 1.0, blood ethanol level 360, ammonia level 36, lipase 54, troponin negative. Dark stool noted on rectal exam and FOBT positive.  CT angiogram abdomen pelvis without evidence of active GI bleed.  CT C-spine showing possible subtle nondisplaced fracture through the superior articular facet at C7 on the right.  Chest x-ray and CT head/maxillofacial negative for acute finding. Patient received IV Protonix 40 mg, IV potassium 10 mEq x 3, and 1 L normal saline bolus.   GI Dr. Leonides Schanz consulted.  ED physician also discussed with on-call provider for neurosurgery who recommended c-collar and outpatient follow-up.  [facility] called to admit.  Patient refused to give any history.  He seemed agitated and stated "I don't want to talk about my health."  Review of Systems:  Review of Systems  Reason unable to perform ROS: Patient refused.    Past Medical History:  Diagnosis Date   Alcohol withdrawal syndrome with complication (HCC)    Alcoholism (HCC)    Elevated AST (SGOT)    Gastrointestinal hemorrhage    Homeless    Hypertension    Pancreatic  insufficiency    takes Creon   Symptomatic anemia 11/23/2015   Thrombocytopenia (HCC) 05/09/2021    Past Surgical History:  Procedure Laterality Date   BIOPSY  06/23/2019   Procedure: BIOPSY;  Surgeon: Shellia Cleverly, DO;  Location: [facility] ENDOSCOPY;  Service: Gastroenterology;;   BIOPSY  12/12/2020   Procedure: BIOPSY;  Surgeon: Lynann Bologna, MD;  Location: [facility] ENDOSCOPY;  Service: Endoscopy;;   BIOPSY  08/04/2021   Procedure: BIOPSY;  Surgeon: Jenel Lucks, MD;  Location: [facility] ENDOSCOPY;  Service: Gastroenterology;;   ESOPHAGOGASTRODUODENOSCOPY N/A 08/04/2021   Procedure: ESOPHAGOGASTRODUODENOSCOPY (EGD);  Surgeon: Jenel Lucks, MD;  Location: [facility] ENDOSCOPY;  Service: Gastroenterology;  Laterality: N/A;   ESOPHAGOGASTRODUODENOSCOPY (EGD) WITH PROPOFOL N/A 06/23/2019   Procedure: ESOPHAGOGASTRODUODENOSCOPY (EGD) WITH PROPOFOL;  Surgeon: Shellia Cleverly, DO;  Location: [facility] ENDOSCOPY;  Service: Gastroenterology;  Laterality: N/A;   ESOPHAGOGASTRODUODENOSCOPY (EGD) WITH PROPOFOL N/A 12/12/2020   Procedure: ESOPHAGOGASTRODUODENOSCOPY (EGD) WITH PROPOFOL;  Surgeon: Lynann Bologna, MD;  Location: [facility] ENDOSCOPY;  Service: Endoscopy;  Laterality: N/A;   HOT HEMOSTASIS N/A 06/23/2019   Procedure: HOT HEMOSTASIS (ARGON PLASMA COAGULATION/BICAP);  Surgeon: Shellia Cleverly, DO;  Location: [facility] ENDOSCOPY;  Service: Gastroenterology;  Laterality: N/A;     reports that he has been smoking. He has a 30.00 pack-year smoking history. He has never used smokeless tobacco. He reports current alcohol use. He reports that he does not use drugs.  No Known Allergies  Family History  Problem Relation Age of Onset   Diabetes Mellitus II Neg Hx    Colon cancer Neg Hx  Stomach cancer Neg Hx    Pancreatic cancer Neg Hx     Prior to Admission medications   Medication Sig Start Date End Date Taking? Authorizing Provider  acetaminophen (TYLENOL) 325 MG tablet Take 2 tablets (650 mg total) by mouth  every 6 (six) hours as needed for fever, moderate pain, mild pain or headache. Patient not taking: Reported on 05/06/2021 12/12/20   Belva Agee, MD  amLODipine (NORVASC) 5 MG tablet Take 1 tablet (5 mg total) by mouth daily. 12/27/21   [name], MD  folic acid (FOLVITE) 1 MG tablet Take 1 tablet (1 mg total) by mouth daily. 10/17/21   [name], MD  gabapentin (NEURONTIN) 100 MG capsule Take 1 capsule (100 mg total) by mouth 3 (three) times daily. 01/23/22   [name], MD  Iron, Ferrous Sulfate, 325 (65 Fe) MG TABS Take 1 tablet ( 325 mg) by mouth daily. 10/17/21   [name], MD  lipase/protease/amylase (CREON) 12000-38000 units CPEP capsule Take 1 capsule (12,000 Units total) by mouth 3 (three) times daily before meals. 12/27/21   [name], MD  LORazepam (ATIVAN) 1 MG tablet Take 3 tablets (3 mg total) by mouth daily for 5 days, THEN 2 tablets (2 mg total) daily for 5 days, THEN 1 tablet (1 mg total) daily thereafter 01/23/22 02/18/22  [name], MD  Multiple Vitamin (MULTIVITAMIN WITH MINERALS) TABS tablet Take 1 tablet by mouth daily. 09/27/21   [name], MD  ondansetron (ZOFRAN) 8 MG tablet Take 1 tablet (8 mg total) by mouth every 6 (six) hours as needed for nausea. 01/23/22   [name], MD  pantoprazole (PROTONIX) 40 MG tablet Take 1 tablet (40 mg total) by mouth 2 (two) times daily before a meal. 12/27/21   [name], MD  polyethylene glycol (MIRALAX / GLYCOLAX) 17 g packet Take 17 g by mouth daily as needed for mild constipation or moderate constipation. 10/07/21   Rocky Morel, DO  senna-docusate (SENOKOT-S) 8.6-50 MG tablet Take 1 tablet by mouth at bedtime as needed for mild constipation. 10/07/21   Rocky Morel, DO  sucralfate (CARAFATE) 1 g tablet Take 1 tablet (1 g total) by mouth 4 (four) times daily -  with meals and at bedtime. 12/27/21   [name], MD  thiamine (VITAMIN B1) 100 MG tablet Take 1  tablet (100 mg total) by mouth daily. 10/02/21   [name], MD    Physical Exam: Vitals:   01/30/22 1633 01/30/22 1800 01/30/22 1900  BP: (!) 135/90 118/70 124/82  Pulse: (!) 106 84 91  Resp: 15 20 11   Temp: 98.4 F (36.9 C)    TempSrc: Oral    SpO2: 90% 97% 90%    Physical Exam Vitals reviewed.  Constitutional:      General: He is not in acute distress.    Comments: Physical examination limited due to lack of patient cooperation.  HENT:     Head: Normocephalic and atraumatic.  Eyes:     Extraocular Movements: Extraocular movements intact.  Cardiovascular:     Rate and Rhythm: Normal rate and regular rhythm.     Pulses: Normal pulses.  Pulmonary:     Effort: Pulmonary effort is normal. No respiratory distress.     Breath sounds: No wheezing or rales.  Abdominal:     Comments: Patient refused examination of his abdomen  Musculoskeletal:     Right lower leg: No edema.  Left lower leg: No edema.  Skin:    General: Skin is warm and dry.  Neurological:     Mental Status: He is alert and oriented to person, place, and time.     Comments: Moving all extremities spontaneously, no focal weakness     Labs on Admission: I have personally reviewed following labs and imaging studies  CBC: Recent Labs  Lab 01/30/22 1704  WBC 3.8*  NEUTROABS 2.3  HGB 12.4*  HCT 38.8*  MCV 95.1  PLT 147*   Basic Metabolic Panel: Recent Labs  Lab 01/30/22 1704  NA 139  K 2.8*  CL 103  CO2 22  GLUCOSE 87  BUN 6  CREATININE 0.56*  CALCIUM 8.8*   GFR: CrCl cannot be calculated (Unknown ideal weight.). Liver Function Tests: Recent Labs  Lab 01/30/22 1704  AST 114*  ALT 32  ALKPHOS 98  BILITOT 1.0  PROT 8.5*  ALBUMIN 3.7   Recent Labs  Lab 01/30/22 1704  LIPASE 54*   Recent Labs  Lab 01/30/22 1704  AMMONIA 36*   Coagulation Profile: Recent Labs  Lab 01/30/22 1704  INR 1.0   Cardiac Enzymes: No results for input(s): "CKTOTAL", "CKMB", "CKMBINDEX",  "TROPONINI" in the last 168 hours. BNP (last 3 results) No results for input(s): "PROBNP" in the last 8760 hours. HbA1C: No results for input(s): "HGBA1C" in the last 72 hours. CBG: Recent Labs  Lab 01/30/22 1656  GLUCAP 88   Lipid Profile: No results for input(s): "CHOL", "HDL", "LDLCALC", "TRIG", "CHOLHDL", "LDLDIRECT" in the last 72 hours. Thyroid Function Tests: No results for input(s): "TSH", "T4TOTAL", "FREET4", "T3FREE", "THYROIDAB" in the last 72 hours. Anemia Panel: No results for input(s): "VITAMINB12", "FOLATE", "FERRITIN", "TIBC", "IRON", "RETICCTPCT" in the last 72 hours. Urine analysis:    Component Value Date/Time   COLORURINE YELLOW 12/17/2021 1957   APPEARANCEUR CLEAR 12/17/2021 1957   LABSPEC 1.009 12/17/2021 1957   PHURINE 6.0 12/17/2021 1957   GLUCOSEU NEGATIVE 12/17/2021 1957   HGBUR NEGATIVE 12/17/2021 1957   BILIRUBINUR NEGATIVE 12/17/2021 1957   KETONESUR NEGATIVE 12/17/2021 1957   PROTEINUR 30 (A) 12/17/2021 1957   NITRITE POSITIVE (A) 12/17/2021 1957   LEUKOCYTESUR NEGATIVE 12/17/2021 1957    Radiological Exams on Admission: CT Head Wo Contrast  Result Date: 01/30/2022 CLINICAL DATA:  Trauma EXAM: CT HEAD WITHOUT CONTRAST CT MAXILLOFACIAL WITHOUT CONTRAST CT CERVICAL SPINE WITHOUT CONTRAST TECHNIQUE: Multidetector CT imaging of the head, cervical spine, and maxillofacial structures were performed using the standard protocol without intravenous contrast. Multiplanar CT image reconstructions of the cervical spine and maxillofacial structures were also generated. RADIATION DOSE REDUCTION: This exam was performed according to the departmental dose-optimization program which includes automated exposure control, adjustment of the mA and/or kV according to patient size and/or use of iterative reconstruction technique. COMPARISON:  CT [place] 08/26/20, MRI brain 08/26/20 FINDINGS: CT HEAD FINDINGS Brain: No evidence of acute infarction, hemorrhage, hydrocephalus,  extra-axial collection or mass lesion/mass effect. Vascular: No hyperdense vessel or unexpected calcification. Skull: Normal. Negative for fracture or focal lesion. Other: Soft tissue injury at the vertex. CT MAXILLOFACIAL FINDINGS Osseous: No fracture or mandibular dislocation. No destructive process. Orbits: Negative. No traumatic or inflammatory finding. Sinuses: Mucosal thickening left maxillary sinus Soft tissues: Negative. CT CERVICAL SPINE FINDINGS Alignment: There is trace anterolisthesis of C6 on C7. Mild retrolisthesis of C3 on C4 and C4 on C5. Skull base and vertebrae: There is a subtle nondisplaced fracture through the superior articular facet at C7 on the right. Soft  tissues and spinal canal: No prevertebral fluid or swelling. No visible canal hematoma. Disc levels: Mild spinal canal narrowing at C2-C3 and C4-C5. Upper chest: Negative. Other: None IMPRESSION: 1. No CT evidence of intracranial injury. 2. Possible subtle nondisplaced fracture through the superior articular facet at C7 on the right. 3. No evidence of facial bone fracture. Electronically Signed   By: Lorenza CambridgeHemant  Desai M.D.   On: 01/30/2022 19:04   CT Cervical Spine Wo Contrast  Result Date: 01/30/2022 CLINICAL DATA:  Trauma EXAM: CT HEAD WITHOUT CONTRAST CT MAXILLOFACIAL WITHOUT CONTRAST CT CERVICAL SPINE WITHOUT CONTRAST TECHNIQUE: Multidetector CT imaging of the head, cervical spine, and maxillofacial structures were performed using the standard protocol without intravenous contrast. Multiplanar CT image reconstructions of the cervical spine and maxillofacial structures were also generated. RADIATION DOSE REDUCTION: This exam was performed according to the departmental dose-optimization program which includes automated exposure control, adjustment of the mA and/or kV according to patient size and/or use of iterative reconstruction technique. COMPARISON:  CT [place] 08/26/20, MRI brain 08/26/20 FINDINGS: CT HEAD FINDINGS Brain: No evidence of  acute infarction, hemorrhage, hydrocephalus, extra-axial collection or mass lesion/mass effect. Vascular: No hyperdense vessel or unexpected calcification. Skull: Normal. Negative for fracture or focal lesion. Other: Soft tissue injury at the vertex. CT MAXILLOFACIAL FINDINGS Osseous: No fracture or mandibular dislocation. No destructive process. Orbits: Negative. No traumatic or inflammatory finding. Sinuses: Mucosal thickening left maxillary sinus Soft tissues: Negative. CT CERVICAL SPINE FINDINGS Alignment: There is trace anterolisthesis of C6 on C7. Mild retrolisthesis of C3 on C4 and C4 on C5. Skull base and vertebrae: There is a subtle nondisplaced fracture through the superior articular facet at C7 on the right. Soft tissues and spinal canal: No prevertebral fluid or swelling. No visible canal hematoma. Disc levels: Mild spinal canal narrowing at C2-C3 and C4-C5. Upper chest: Negative. Other: None IMPRESSION: 1. No CT evidence of intracranial injury. 2. Possible subtle nondisplaced fracture through the superior articular facet at C7 on the right. 3. No evidence of facial bone fracture. Electronically Signed   By: Lorenza CambridgeHemant  Desai M.D.   On: 01/30/2022 19:04   CT Maxillofacial Wo Contrast  Result Date: 01/30/2022 CLINICAL DATA:  Trauma EXAM: CT HEAD WITHOUT CONTRAST CT MAXILLOFACIAL WITHOUT CONTRAST CT CERVICAL SPINE WITHOUT CONTRAST TECHNIQUE: Multidetector CT imaging of the head, cervical spine, and maxillofacial structures were performed using the standard protocol without intravenous contrast. Multiplanar CT image reconstructions of the cervical spine and maxillofacial structures were also generated. RADIATION DOSE REDUCTION: This exam was performed according to the departmental dose-optimization program which includes automated exposure control, adjustment of the mA and/or kV according to patient size and/or use of iterative reconstruction technique. COMPARISON:  CT [place] 08/26/20, MRI brain 08/26/20  FINDINGS: CT HEAD FINDINGS Brain: No evidence of acute infarction, hemorrhage, hydrocephalus, extra-axial collection or mass lesion/mass effect. Vascular: No hyperdense vessel or unexpected calcification. Skull: Normal. Negative for fracture or focal lesion. Other: Soft tissue injury at the vertex. CT MAXILLOFACIAL FINDINGS Osseous: No fracture or mandibular dislocation. No destructive process. Orbits: Negative. No traumatic or inflammatory finding. Sinuses: Mucosal thickening left maxillary sinus Soft tissues: Negative. CT CERVICAL SPINE FINDINGS Alignment: There is trace anterolisthesis of C6 on C7. Mild retrolisthesis of C3 on C4 and C4 on C5. Skull base and vertebrae: There is a subtle nondisplaced fracture through the superior articular facet at C7 on the right. Soft tissues and spinal canal: No prevertebral fluid or swelling. No visible canal hematoma. Disc levels: Mild spinal canal narrowing  at C2-C3 and C4-C5. Upper chest: Negative. Other: None IMPRESSION: 1. No CT evidence of intracranial injury. 2. Possible subtle nondisplaced fracture through the superior articular facet at C7 on the right. 3. No evidence of facial bone fracture. Electronically Signed   By: Lorenza Cambridge M.D.   On: 01/30/2022 19:04   CT ANGIO ABDOMEN PELVIS  W &/OR WO CONTRAST  Result Date: 01/30/2022 CLINICAL DATA:  Alcohol abuse, vomiting, rectal bleeding EXAM: CTA ABDOMEN AND PELVIS WITHOUT AND WITH CONTRAST TECHNIQUE: Multidetector CT imaging of the abdomen and pelvis was performed using the standard protocol during bolus administration of intravenous contrast. Multiplanar reconstructed images and MIPs were obtained and reviewed to evaluate the vascular anatomy. RADIATION DOSE REDUCTION: This exam was performed according to the departmental dose-optimization program which includes automated exposure control, adjustment of the mA and/or kV according to patient size and/or use of iterative reconstruction technique. CONTRAST:   OMNIPAQUE IOHEXOL 350 MG/ML SOLN COMPARISON:  12/25/2021 FINDINGS: VASCULAR Aorta: Normal caliber aorta without aneurysm, dissection, vasculitis or significant stenosis. Mild atherosclerosis. Celiac: Patent without evidence of aneurysm, dissection, vasculitis or significant stenosis. SMA: Patent without evidence of aneurysm, dissection, vasculitis or significant stenosis. Mild atherosclerosis. Renals: Both renal arteries are patent without evidence of aneurysm, dissection, vasculitis, fibromuscular dysplasia or significant stenosis. Mild atherosclerosis. IMA: Patent without evidence of aneurysm, dissection, vasculitis or significant stenosis. Inflow: Patent without evidence of aneurysm, dissection, vasculitis or significant stenosis. Diffuse atherosclerosis without focal stenosis. Proximal Outflow: Bilateral common femoral and visualized portions of the superficial and profunda femoral arteries are patent without evidence of aneurysm, dissection, vasculitis or significant stenosis. Mild atherosclerosis. Veins: No obvious venous abnormality within the limitations of this arterial phase study. Review of the MIP images confirms the above findings. NON-VASCULAR Lower chest: No acute pleural or parenchymal lung disease. Hepatobiliary: Hepatic steatosis. No biliary duct dilation. Gallbladder is unremarkable. Pancreas: Unremarkable. No pancreatic ductal dilatation or surrounding inflammatory changes. Spleen: Normal in size without focal abnormality. Adrenals/Urinary Tract: Adrenal glands are unremarkable. Kidneys are normal, without renal calculi, focal lesion, or hydronephrosis. Bladder is unremarkable. Stomach/Bowel: No bowel obstruction or ileus. No bowel wall thickening or inflammatory change. No intraluminal accumulation of contrast to suggest active gastrointestinal bleeding. Lymphatic: No pathologic adenopathy within the abdomen or pelvis. Reproductive: Nodular enlargement of the median lobe of the prostate  unchanged. Other: No free fluid or free intraperitoneal gas. Prior umbilical hernia repair. There is a small fat containing supraumbilical midline ventral hernia again noted. No bowel herniation. Musculoskeletal: No acute or destructive bony lesions. Reconstructed images demonstrate no additional findings. IMPRESSION: VASCULAR 1. No evidence of active gastrointestinal bleeding. 2.  Aortic Atherosclerosis (ICD10-I70.0). NON-VASCULAR 1. Hepatic steatosis. 2. No acute intra-abdominal or intrapelvic process. 3. Stable nodular enlargement of the prostate. 4. Stable midline supraumbilical fat containing ventral hernia. Electronically Signed   By: Sharlet Salina M.D.   On: 01/30/2022 19:02   DG Chest 2 View  Result Date: 01/30/2022 CLINICAL DATA:  Altered mental status. EXAM: CHEST - 2 VIEW COMPARISON:  December 06, 2021. FINDINGS: The heart size and mediastinal contours are within normal limits. Both lungs are clear. The visualized skeletal structures are unremarkable. IMPRESSION: No active cardiopulmonary disease. Electronically Signed   By: Lupita Raider M.D.   On: 01/30/2022 18:17    EKG: Independently reviewed.  Sinus rhythm, QTc 484.  No significant change since prior tracing.  Assessment and Plan  Acute GI bleed Patient presented to the ED with complaints of hematemesis and rectal bleeding.  No episodes of hematemesis in the ED but noted to have melena and FOBT positive.  Hemoglobin 12.4 on initial labs, stable.  Patient is currently hemodynamically stable.  Not on anticoagulation or antiplatelet agents. CT angiogram abdomen pelvis without evidence of active GI bleed. EGD done in June 2023 showing a 3 cm hiatal hernia and a single possible duodenal polyp. -[facility] consulted -IV fluid hydration -IV Protonix 40 mg every 12 hours -Keep n.p.o. -Type and screen -Serial H&H every 6 hours  Hypokalemia Likely due to poor p.o. intake/chronic alcohol use. -Cardiac monitoring -Continue to replace  potassium and monitor closely -Check magnesium level and replace if low  C-spine articular facet fracture CT C-spine showing possible subtle nondisplaced fracture through the superior articular facet at C7 on the right. -Neurosurgery recommended c-collar and outpatient follow-up  Chronic alcoholism Patient presented to the ED intoxicated.  He is at risk for withdrawal symptoms during this hospitalization. -CIWA protocol; Ativan as needed -Thiamine, folate, multivitamin  Chronic transaminitis In the setting of chronic alcohol use.  CT showing hepatic steatosis. -Monitor LFTs  Hypertension -Hold antihypertensives at this time in the setting of acute GI bleed  Chronic thrombocytopenia Stable and likely related to chronic alcohol use. -Continue to monitor  Chronic leukopenia Stable, no signs of infection. -Continue to monitor  DVT prophylaxis: SCDs Code Status: Full Code Family Communication: No family available at this time. Consults called: [facility], neurosurgery Level of care: Step Down Unit Admission status: It is my clinical opinion that referral for OBSERVATION is reasonable and necessary in this patient based on the above information provided. The aforementioned taken together are felt to place the patient at high risk for further clinical deterioration. However, it is anticipated that the patient may be medically stable for discharge from the hospital within 24 to 48 hours.   John Giovanni MD [facility]  If 7PM-7AM, please contact night-coverage [email or web address]  01/30/2022, 7:36 PM

## 2022-01-30 NOTE — ED Triage Notes (Signed)
Pt arrived via EMS, from [facility]. Called for fall due to increasing wknl. No LOC, no thinners, no complaints. C/o blood in stool and in emesis. Chronic alcohol use, last drink yesterday.

## 2022-01-30 NOTE — ED Notes (Signed)
Pt's oxygen noted to be 81% with good waveform. Charge RN to bedside and pt found to be asleep and snoring, but is not easily awoken. Pt repositioned and [place] placed on pt at 2lpm. Oxygen saturation increased to 95%.

## 2022-01-30 NOTE — ED Provider Triage Note (Signed)
Emergency Medicine Provider Triage Evaluation Note  Samuel Blevins , a 56 y.o. male  was evaluated in triage.  Pt complains of fall, vomiting and rectal bleeding.  patient is mumbling assembling his words, oriented to self and location.  Denies alcohol use today but endorses drinking yesterday.  Per EMS he was found altered and not making sense after a fall.  Patient is not on blood thinners, history of alcohol use disorder, portal vein thrombosis,  blood in his stool but not able to specify if it is red or dark.  Review of Systems  Per HPI  Physical Exam  BP (!) 135/90 (BP Location: Left Arm)   Pulse (!) 106   Temp 98.4 F (36.9 C) (Oral)   Resp 15   SpO2 90%  Gen:   Awake, altered.   Resp:  Normal effort  MSK:   Moves extremities without difficulty  Other:  Tachycardic. Bleeding from right nare, bright red blood in oropharynx.  Epigastric tenderness.  Medical Decision Making  Medically screening exam initiated at 4:49 PM.  Appropriate orders placed.  Samuel Blevins was informed that the remainder of the evaluation will be completed by another provider, this initial triage assessment does not replace that evaluation, and the importance of remaining in the ED until their evaluation is complete.     Theron Arista, PA-C 01/30/22 1653

## 2022-01-30 NOTE — ED Provider Notes (Signed)
[facility]-EMERGENCY DEPT Provider Note   CSN: [id number] Arrival date & time: 01/30/22  1624     History  Chief Complaint  Patient presents with   Blood In Stools   Hematemesis    Samuel Blevins is a 56 y.o. male.  Level 5 caveat for intoxication.  Patient unable to give any meaningful history.  Has a history of alcohol abuse, hypertension, homelessness, thrombocytopenia.  Reportedly has had increased swelling as well as rectal bleeding and bloody emesis.  Last alcoholic drink was earlier today.  He apparently fell and struck his face is having bleeding from his nose and his mouth.  No blood thinner use.  He is not able to give any meaningful history but complains of pain to his stomach and his head.  Denies any chest pain or shortness of breath.  Does not express any vomiting to me.  Cannot say whether the blood in his stool has been dark or red.  The history is provided by the patient. The history is limited by the condition of the patient.       Home Medications Prior to Admission medications   Medication Sig Start Date End Date Taking? Authorizing Provider  acetaminophen (TYLENOL) 325 MG tablet Take 2 tablets (650 mg total) by mouth every 6 (six) hours as needed for fever, moderate pain, mild pain or headache. Patient not taking: Reported on 05/06/2021 12/12/20   Belva Agee, MD  amLODipine (NORVASC) 5 MG tablet Take 1 tablet (5 mg total) by mouth daily. 12/27/21   [name], MD  folic acid (FOLVITE) 1 MG tablet Take 1 tablet (1 mg total) by mouth daily. 10/17/21   [name], MD  gabapentin (NEURONTIN) 100 MG capsule Take 1 capsule (100 mg total) by mouth 3 (three) times daily. 01/23/22   [name], MD  Iron, Ferrous Sulfate, 325 (65 Fe) MG TABS Take 1 tablet ( 325 mg) by mouth daily. 10/17/21   [name], MD  lipase/protease/amylase (CREON) 12000-38000 units CPEP capsule Take 1 capsule (12,000 Units total) by mouth 3  (three) times daily before meals. 12/27/21   [name], MD  LORazepam (ATIVAN) 1 MG tablet Take 3 tablets (3 mg total) by mouth daily for 5 days, THEN 2 tablets (2 mg total) daily for 5 days, THEN 1 tablet (1 mg total) daily thereafter 01/23/22 02/18/22  [name], MD  Multiple Vitamin (MULTIVITAMIN WITH MINERALS) TABS tablet Take 1 tablet by mouth daily. 09/27/21   [name], MD  ondansetron (ZOFRAN) 8 MG tablet Take 1 tablet (8 mg total) by mouth every 6 (six) hours as needed for nausea. 01/23/22   [name], MD  pantoprazole (PROTONIX) 40 MG tablet Take 1 tablet (40 mg total) by mouth 2 (two) times daily before a meal. 12/27/21   [name], MD  polyethylene glycol (MIRALAX / GLYCOLAX) 17 g packet Take 17 g by mouth daily as needed for mild constipation or moderate constipation. 10/07/21   Rocky Morel, DO  senna-docusate (SENOKOT-S) 8.6-50 MG tablet Take 1 tablet by mouth at bedtime as needed for mild constipation. 10/07/21   Rocky Morel, DO  sucralfate (CARAFATE) 1 g tablet Take 1 tablet (1 g total) by mouth 4 (four) times daily -  with meals and at bedtime. 12/27/21   [name], MD  thiamine (VITAMIN B1) 100 MG tablet Take 1 tablet (100 mg total) by mouth daily. 10/02/21   [name], MD  Allergies    Patient has no known allergies.    Review of Systems   Review of Systems  Unable to perform ROS: Mental status change    Physical Exam Updated Vital Signs BP (!) 135/90 (BP Location: Left Arm)   Pulse (!) 106   Temp 98.4 F (36.9 C) (Oral)   Resp 15   SpO2 90%  Physical Exam Vitals and nursing note reviewed.  Constitutional:      General: He is not in acute distress.    Appearance: He is well-developed. He is ill-appearing.     Comments: Obtunded, intoxicated, oriented to person only.  HENT:     Head: Normocephalic and atraumatic.     Nose:     Comments: Blood in right nare.  Scattered blood in oropharynx.  No trismus or  malocclusion.    Mouth/Throat:     Pharynx: No oropharyngeal exudate.  Eyes:     Conjunctiva/sclera: Conjunctivae normal.     Pupils: Pupils are equal, round, and reactive to light.  Neck:     Comments: No meningismus. Cardiovascular:     Rate and Rhythm: Normal rate and regular rhythm.     Heart sounds: Normal heart sounds. No murmur heard. Pulmonary:     Effort: Pulmonary effort is normal. No respiratory distress.     Breath sounds: Normal breath sounds.  Chest:     Chest wall: No tenderness.  Abdominal:     Palpations: Abdomen is soft.     Tenderness: There is abdominal tenderness. There is no guarding or rebound.     Comments: Right-sided abdominal tenderness, no guarding or rebound.  Genitourinary:    Comments: Dark stool on rectal exam, no hemorrhoids or fissures Musculoskeletal:        General: No tenderness. Normal range of motion.     Cervical back: Normal range of motion and neck supple.  Skin:    General: Skin is warm.  Neurological:     Mental Status: He is alert.     Cranial Nerves: No cranial nerve deficit.     Motor: No abnormal muscle tone.     Coordination: Coordination normal.     Comments: Moves all extremities equally, unsteady gait.  Psychiatric:        Behavior: Behavior normal.     ED Results / Procedures / Treatments   Labs (all labs ordered are listed, but only abnormal results are displayed) Labs Reviewed  CBC WITH DIFFERENTIAL/PLATELET - Abnormal; Notable for the following components:      Result Value   WBC 3.8 (*)    RBC 4.08 (*)    Hemoglobin 12.4 (*)    HCT 38.8 (*)    RDW 16.8 (*)    Platelets 147 (*)    All other components within normal limits  COMPREHENSIVE METABOLIC PANEL - Abnormal; Notable for the following components:   Potassium 2.8 (*)    Creatinine, Ser 0.56 (*)    Calcium 8.8 (*)    Total Protein 8.5 (*)    AST 114 (*)    All other components within normal limits  ETHANOL - Abnormal; Notable for the following  components:   Alcohol, Ethyl (B) 360 (*)    All other components within normal limits  AMMONIA - Abnormal; Notable for the following components:   Ammonia 36 (*)    All other components within normal limits  LIPASE, BLOOD - Abnormal; Notable for the following components:   Lipase 54 (*)    All other components  within normal limits  PROTIME-INR  OCCULT BLOOD X 1 CARD TO LAB, STOOL  CBG MONITORING, ED  POC OCCULT BLOOD, ED  TYPE AND SCREEN  TROPONIN I (HIGH SENSITIVITY)  TROPONIN I (HIGH SENSITIVITY)    EKG EKG Interpretation  Date/Time:  Wednesday January 30 2022 18:54:56 EST Ventricular Rate:  94 PR Interval:  171 QRS Duration: 114 QT Interval:  387 QTC Calculation: 484 R Axis:   98 Text Interpretation: Sinus rhythm Borderline intraventricular conduction delay Borderline prolonged QT interval No significant change was found Confirmed by Glynn Octaveancour, [place] Seaborn [phone]([id number]) on 01/30/2022 7:42:13 PM  Radiology CT Head Wo Contrast  Result Date: 01/30/2022 CLINICAL DATA:  Trauma EXAM: CT HEAD WITHOUT CONTRAST CT MAXILLOFACIAL WITHOUT CONTRAST CT CERVICAL SPINE WITHOUT CONTRAST TECHNIQUE: Multidetector CT imaging of the head, cervical spine, and maxillofacial structures were performed using the standard protocol without intravenous contrast. Multiplanar CT image reconstructions of the cervical spine and maxillofacial structures were also generated. RADIATION DOSE REDUCTION: This exam was performed according to the departmental dose-optimization program which includes automated exposure control, adjustment of the mA and/or kV according to patient size and/or use of iterative reconstruction technique. COMPARISON:  CT [place] 08/26/20, MRI brain 08/26/20 FINDINGS: CT HEAD FINDINGS Brain: No evidence of acute infarction, hemorrhage, hydrocephalus, extra-axial collection or mass lesion/mass effect. Vascular: No hyperdense vessel or unexpected calcification. Skull: Normal. Negative for fracture or focal lesion.  Other: Soft tissue injury at the vertex. CT MAXILLOFACIAL FINDINGS Osseous: No fracture or mandibular dislocation. No destructive process. Orbits: Negative. No traumatic or inflammatory finding. Sinuses: Mucosal thickening left maxillary sinus Soft tissues: Negative. CT CERVICAL SPINE FINDINGS Alignment: There is trace anterolisthesis of C6 on C7. Mild retrolisthesis of C3 on C4 and C4 on C5. Skull base and vertebrae: There is a subtle nondisplaced fracture through the superior articular facet at C7 on the right. Soft tissues and spinal canal: No prevertebral fluid or swelling. No visible canal hematoma. Disc levels: Mild spinal canal narrowing at C2-C3 and C4-C5. Upper chest: Negative. Other: None IMPRESSION: 1. No CT evidence of intracranial injury. 2. Possible subtle nondisplaced fracture through the superior articular facet at C7 on the right. 3. No evidence of facial bone fracture. Electronically Signed   By: Lorenza CambridgeHemant  Desai M.D.   On: 01/30/2022 19:04   CT Cervical Spine Wo Contrast  Result Date: 01/30/2022 CLINICAL DATA:  Trauma EXAM: CT HEAD WITHOUT CONTRAST CT MAXILLOFACIAL WITHOUT CONTRAST CT CERVICAL SPINE WITHOUT CONTRAST TECHNIQUE: Multidetector CT imaging of the head, cervical spine, and maxillofacial structures were performed using the standard protocol without intravenous contrast. Multiplanar CT image reconstructions of the cervical spine and maxillofacial structures were also generated. RADIATION DOSE REDUCTION: This exam was performed according to the departmental dose-optimization program which includes automated exposure control, adjustment of the mA and/or kV according to patient size and/or use of iterative reconstruction technique. COMPARISON:  CT [place] 08/26/20, MRI brain 08/26/20 FINDINGS: CT HEAD FINDINGS Brain: No evidence of acute infarction, hemorrhage, hydrocephalus, extra-axial collection or mass lesion/mass effect. Vascular: No hyperdense vessel or unexpected calcification. Skull:  Normal. Negative for fracture or focal lesion. Other: Soft tissue injury at the vertex. CT MAXILLOFACIAL FINDINGS Osseous: No fracture or mandibular dislocation. No destructive process. Orbits: Negative. No traumatic or inflammatory finding. Sinuses: Mucosal thickening left maxillary sinus Soft tissues: Negative. CT CERVICAL SPINE FINDINGS Alignment: There is trace anterolisthesis of C6 on C7. Mild retrolisthesis of C3 on C4 and C4 on C5. Skull base and vertebrae: There is a subtle nondisplaced fracture through  the superior articular facet at C7 on the right. Soft tissues and spinal canal: No prevertebral fluid or swelling. No visible canal hematoma. Disc levels: Mild spinal canal narrowing at C2-C3 and C4-C5. Upper chest: Negative. Other: None IMPRESSION: 1. No CT evidence of intracranial injury. 2. Possible subtle nondisplaced fracture through the superior articular facet at C7 on the right. 3. No evidence of facial bone fracture. Electronically Signed   By: Lorenza Cambridge M.D.   On: 01/30/2022 19:04   CT Maxillofacial Wo Contrast  Result Date: 01/30/2022 CLINICAL DATA:  Trauma EXAM: CT HEAD WITHOUT CONTRAST CT MAXILLOFACIAL WITHOUT CONTRAST CT CERVICAL SPINE WITHOUT CONTRAST TECHNIQUE: Multidetector CT imaging of the head, cervical spine, and maxillofacial structures were performed using the standard protocol without intravenous contrast. Multiplanar CT image reconstructions of the cervical spine and maxillofacial structures were also generated. RADIATION DOSE REDUCTION: This exam was performed according to the departmental dose-optimization program which includes automated exposure control, adjustment of the mA and/or kV according to patient size and/or use of iterative reconstruction technique. COMPARISON:  CT [place] 08/26/20, MRI brain 08/26/20 FINDINGS: CT HEAD FINDINGS Brain: No evidence of acute infarction, hemorrhage, hydrocephalus, extra-axial collection or mass lesion/mass effect. Vascular: No hyperdense  vessel or unexpected calcification. Skull: Normal. Negative for fracture or focal lesion. Other: Soft tissue injury at the vertex. CT MAXILLOFACIAL FINDINGS Osseous: No fracture or mandibular dislocation. No destructive process. Orbits: Negative. No traumatic or inflammatory finding. Sinuses: Mucosal thickening left maxillary sinus Soft tissues: Negative. CT CERVICAL SPINE FINDINGS Alignment: There is trace anterolisthesis of C6 on C7. Mild retrolisthesis of C3 on C4 and C4 on C5. Skull base and vertebrae: There is a subtle nondisplaced fracture through the superior articular facet at C7 on the right. Soft tissues and spinal canal: No prevertebral fluid or swelling. No visible canal hematoma. Disc levels: Mild spinal canal narrowing at C2-C3 and C4-C5. Upper chest: Negative. Other: None IMPRESSION: 1. No CT evidence of intracranial injury. 2. Possible subtle nondisplaced fracture through the superior articular facet at C7 on the right. 3. No evidence of facial bone fracture. Electronically Signed   By: Lorenza Cambridge M.D.   On: 01/30/2022 19:04   CT ANGIO ABDOMEN PELVIS  W &/OR WO CONTRAST  Result Date: 01/30/2022 CLINICAL DATA:  Alcohol abuse, vomiting, rectal bleeding EXAM: CTA ABDOMEN AND PELVIS WITHOUT AND WITH CONTRAST TECHNIQUE: Multidetector CT imaging of the abdomen and pelvis was performed using the standard protocol during bolus administration of intravenous contrast. Multiplanar reconstructed images and MIPs were obtained and reviewed to evaluate the vascular anatomy. RADIATION DOSE REDUCTION: This exam was performed according to the departmental dose-optimization program which includes automated exposure control, adjustment of the mA and/or kV according to patient size and/or use of iterative reconstruction technique. CONTRAST:  OMNIPAQUE IOHEXOL 350 MG/ML SOLN COMPARISON:  12/25/2021 FINDINGS: VASCULAR Aorta: Normal caliber aorta without aneurysm, dissection, vasculitis or significant stenosis.  Mild atherosclerosis. Celiac: Patent without evidence of aneurysm, dissection, vasculitis or significant stenosis. SMA: Patent without evidence of aneurysm, dissection, vasculitis or significant stenosis. Mild atherosclerosis. Renals: Both renal arteries are patent without evidence of aneurysm, dissection, vasculitis, fibromuscular dysplasia or significant stenosis. Mild atherosclerosis. IMA: Patent without evidence of aneurysm, dissection, vasculitis or significant stenosis. Inflow: Patent without evidence of aneurysm, dissection, vasculitis or significant stenosis. Diffuse atherosclerosis without focal stenosis. Proximal Outflow: Bilateral common femoral and visualized portions of the superficial and profunda femoral arteries are patent without evidence of aneurysm, dissection, vasculitis or significant stenosis. Mild atherosclerosis. Veins: No  obvious venous abnormality within the limitations of this arterial phase study. Review of the MIP images confirms the above findings. NON-VASCULAR Lower chest: No acute pleural or parenchymal lung disease. Hepatobiliary: Hepatic steatosis. No biliary duct dilation. Gallbladder is unremarkable. Pancreas: Unremarkable. No pancreatic ductal dilatation or surrounding inflammatory changes. Spleen: Normal in size without focal abnormality. Adrenals/Urinary Tract: Adrenal glands are unremarkable. Kidneys are normal, without renal calculi, focal lesion, or hydronephrosis. Bladder is unremarkable. Stomach/Bowel: No bowel obstruction or ileus. No bowel wall thickening or inflammatory change. No intraluminal accumulation of contrast to suggest active gastrointestinal bleeding. Lymphatic: No pathologic adenopathy within the abdomen or pelvis. Reproductive: Nodular enlargement of the median lobe of the prostate unchanged. Other: No free fluid or free intraperitoneal gas. Prior umbilical hernia repair. There is a small fat containing supraumbilical midline ventral hernia again noted. No  bowel herniation. Musculoskeletal: No acute or destructive bony lesions. Reconstructed images demonstrate no additional findings. IMPRESSION: VASCULAR 1. No evidence of active gastrointestinal bleeding. 2.  Aortic Atherosclerosis (ICD10-I70.0). NON-VASCULAR 1. Hepatic steatosis. 2. No acute intra-abdominal or intrapelvic process. 3. Stable nodular enlargement of the prostate. 4. Stable midline supraumbilical fat containing ventral hernia. Electronically Signed   By: Sharlet Salina M.D.   On: 01/30/2022 19:02   DG Chest 2 View  Result Date: 01/30/2022 CLINICAL DATA:  Altered mental status. EXAM: CHEST - 2 VIEW COMPARISON:  December 06, 2021. FINDINGS: The heart size and mediastinal contours are within normal limits. Both lungs are clear. The visualized skeletal structures are unremarkable. IMPRESSION: No active cardiopulmonary disease. Electronically Signed   By: Lupita Raider M.D.   On: 01/30/2022 18:17    Procedures Procedures    Medications Ordered in ED Medications  sodium chloride 0.9 % bolus 1,000 mL (has no administration in time range)  pantoprazole (PROTONIX) injection 40 mg (has no administration in time range)    ED Course/ Medical Decision Making/ A&P                           Medical Decision Making Amount and/or Complexity of Data Reviewed Labs: ordered. Decision-making details documented in ED Course. Radiology: ordered and independent interpretation performed. Decision-making details documented in ED Course. ECG/medicine tests: ordered and independent interpretation performed. Decision-making details documented in ED Course.  Risk Prescription drug management. Decision regarding hospitalization.  Alcohol intoxication with falls, rectal bleeding and vomiting blood.  Mild tachycardia on arrival with stable blood pressure.  Does have evidence of facial trauma.  Abdomen soft without peritoneal signs. EGD in June 2023 showed no varices or ulcers.  Hemoglobin stable 12.4.   Hypokalemia 2.8.  Will replace.  Hemoccult is positive.  Recent EGD showed gastritis and hiatal hernia with evidence of varices or ulcers Labs also remarkable for alcohol intoxication  CT head and max face are negative for acute traumatic injury.  CT C-spine does show facet fracture at C7 that is nondisplaced.  Discussed with Selena Batten NP of neurosurgery who agrees with cervical collar and outpatient follow-up.  CT angio abdomen pelvis does not show any evidence of active GI bleeding.  Vitals remained stable.  Patient remains intoxicated.  Admission for continued observation in setting of GI bleed and alcohol abuse.  Hypokalemia to be replaced.  Vitals remained stable.  Protecting airway.  Chest x-ray does not show any infiltrate.  CT angio abdomen and pelvis does not show any evidence of acute areas of GI bleeding.  Traumatic imaging of head and face and  C-spine as above.  C-collar placed on patient will need neurosurgery follow-up.  Plan observation admission for further evaluation of GI bleed and reported hematemesis.  Message sent to gastroenterology.  Admission discussed with Dr. Loney Loh.         Final Clinical Impression(s) / ED Diagnoses Final diagnoses:  Rectal bleeding  Alcoholic intoxication without complication (HCC)  Hypokalemia  Closed nondisplaced fracture of seventh cervical vertebra, unspecified fracture morphology, initial encounter [facility])    Rx / DC Orders ED Discharge Orders     None         Glynn Octave, MD 01/30/22 1955

## 2022-01-30 NOTE — ED Notes (Signed)
ED TO INPATIENT HANDOFF REPORT  ED Nurse Name and Phone #: Marline Backbonezia K [phone]  S Name/Age/Gender Samuel Blevins 56 y.o. male Room/Bed: WA14/WA14  Code Status   Code Status: Full Code  Home/SNF/Other Home Patient oriented to: self, place, time, and situation Is this baseline? Yes   Triage Complete: Triage complete  Chief Complaint GI bleed [K92.2]  Triage Note Pt arrived via EMS, [facility]. Called for fall due to increasing wknl. No LOC, no thinners, no complaints. C/o blood in stool and in emesis. Chronic alcohol use, last drink yesterday.    Allergies No Known Allergies  Level of Care/Admitting Diagnosis ED Disposition     ED Disposition  Admit   Condition  --   Comment  [facility] [[id number]]  Level of Care: Stepdown [14]  Admit to SDU based on following criteria: Hemodynamic compromise or significant risk of instability:  Patient requiring short term acute titration and management of vasoactive drips, and invasive monitoring (i.e., CVP and Arterial line).  [facility] patient [facility] if equivalent level of care is available:: Yes  Covid Evaluation: Asymptomatic - no recent exposure (last 10 days) testing not required  Diagnosis: GI bleed [[id number]][[id number]]  Admitting Physician: John GiovanniRATHORE, VASUNDHRA [[id number]]  Attending Physician: John GiovanniATHORE, VASUNDHRA [[id number]]          B Medical/Surgery History Past Medical History:  Diagnosis Date   Alcohol withdrawal syndrome with complication (HCC)    Alcoholism (HCC)    Elevated AST (SGOT)    Gastrointestinal hemorrhage    Homeless    Hypertension    Pancreatic insufficiency    takes Creon   Symptomatic anemia 11/23/2015   Thrombocytopenia (HCC) 05/09/2021   Past Surgical History:  Procedure Laterality Date   BIOPSY  06/23/2019   Procedure: BIOPSY;  Surgeon: Shellia Cleverlyirigliano, Vito V, DO;  Location: [facility];  Service: Gastroenterology;;   BIOPSY   12/12/2020   Procedure: BIOPSY;  Surgeon: Lynann BolognaGupta, Rajesh, MD;  [facility];  Service: Endoscopy;;   BIOPSY  08/04/2021   Procedure: BIOPSY;  Surgeon: Jenel Lucksunningham, Scott E, MD;  Location: [facility];  Service: Gastroenterology;;   ESOPHAGOGASTRODUODENOSCOPY N/A 08/04/2021   Procedure: ESOPHAGOGASTRODUODENOSCOPY (EGD);  Surgeon: Jenel Lucksunningham, Scott E, MD;  [facility];  Service: Gastroenterology;  Laterality: N/A;   ESOPHAGOGASTRODUODENOSCOPY (EGD) WITH PROPOFOL N/A 06/23/2019   Procedure: ESOPHAGOGASTRODUODENOSCOPY (EGD) WITH PROPOFOL;  Surgeon: Shellia Cleverlyirigliano, Vito V, DO;  Location: [facility];  Service: Gastroenterology;  Laterality: N/A;   ESOPHAGOGASTRODUODENOSCOPY (EGD) WITH PROPOFOL N/A 12/12/2020   Procedure: ESOPHAGOGASTRODUODENOSCOPY (EGD) WITH PROPOFOL;  Surgeon: Lynann BolognaGupta, Rajesh, MD;  [facility];  Service: Endoscopy;  Laterality: N/A;   HOT HEMOSTASIS N/A 06/23/2019   Procedure: HOT HEMOSTASIS (ARGON PLASMA COAGULATION/BICAP);  Surgeon: Shellia Cleverlyirigliano, Vito V, DO;  [facility];  Service: Gastroenterology;  Laterality: N/A;     A IV Location/Drains/Wounds Patient Lines/Drains/Airways Status     Active Line/Drains/Airways     Name Placement date Placement time Site Days   Peripheral IV 01/30/22 20 G Posterior;Right Hand 01/30/22  1701  Hand  less than 1   Peripheral IV 01/30/22 18 G 1.16" Anterior;Distal;Right;Upper Arm 01/30/22  1828  Arm  less than 1            Intake/Output Last 24 hours No intake or output data in the 24 hours ending 01/30/22 2255  Labs/Imaging Results for orders placed or performed during the hospital encounter of 01/30/22 (from the past 48 hour(s))  CBG monitoring, ED     Status: None   Collection Time: 01/30/22  4:56 PM  Result Value Ref Range   Glucose-Capillary 88 70 - 99 mg/dL    Comment: Glucose reference range applies only to samples taken after fasting for at least 8 hours.  CBC with Differential     Status:  Abnormal   Collection Time: 01/30/22  5:04 PM  Result Value Ref Range   WBC 3.8 (L) 4.0 - 10.5 K/uL   RBC 4.08 (L) 4.22 - 5.81 MIL/uL   Hemoglobin 12.4 (L) 13.0 - 17.0 g/dL   HCT 93.2 (L) 67.1 - 24.5 %   MCV 95.1 80.0 - 100.0 fL   MCH 30.4 26.0 - 34.0 pg   MCHC 32.0 30.0 - 36.0 g/dL   RDW 80.9 (H) 98.3 - 38.2 %   Platelets 147 (L) 150 - 400 K/uL   nRBC 0.0 0.0 - 0.2 %   Neutrophils Relative % 60 %   Neutro Abs 2.3 1.7 - 7.7 K/uL   Lymphocytes Relative 31 %   Lymphs Abs 1.2 0.7 - 4.0 K/uL   Monocytes Relative 8 %   Monocytes Absolute 0.3 0.1 - 1.0 K/uL   Eosinophils Relative 0 %   Eosinophils Absolute 0.0 0.0 - 0.5 K/uL   Basophils Relative 1 %   Basophils Absolute 0.0 0.0 - 0.1 K/uL   Immature Granulocytes 0 %   Abs Immature Granulocytes 0.01 0.00 - 0.07 K/uL    Comment: Performed at [facility], [address], [place], [place] [address]  Comprehensive metabolic panel     Status: Abnormal   Collection Time: 01/30/22  5:04 PM  Result Value Ref Range   Sodium 139 135 - 145 mmol/L   Potassium 2.8 (L) 3.5 - 5.1 mmol/L   Chloride 103 98 - 111 mmol/L   CO2 22 22 - 32 mmol/L   Glucose, Bld 87 70 - 99 mg/dL    Comment: Glucose reference range applies only to samples taken after fasting for at least 8 hours.   BUN 6 6 - 20 mg/dL   Creatinine, Ser 7.67 (L) 0.61 - 1.24 mg/dL   Calcium 8.8 (L) 8.9 - 10.3 mg/dL   Total Protein 8.5 (H) 6.5 - 8.1 g/dL   Albumin 3.7 3.5 - 5.0 g/dL   AST 341 (H) 15 - 41 U/L   ALT 32 0 - 44 U/L   Alkaline Phosphatase 98 38 - 126 U/L   Total Bilirubin 1.0 0.3 - 1.2 mg/dL   GFR, Estimated >93 >79 mL/min    Comment: (NOTE) Calculated using the CKD-EPI Creatinine Equation (2021)    Anion gap 14 5 - 15    Comment: Performed at [facility], [address] [place], [place] [address]  Protime-INR     Status: None   Collection Time: 01/30/22  5:04 PM  Result Value Ref Range   Prothrombin Time 13.0 11.4 - 15.2  seconds   INR 1.0 0.8 - 1.2    Comment: (NOTE) INR goal varies based on device and disease states. Performed at [facility], [address], [place], [place] [address]   Type and screen [facility]     Status: None   Collection Time: 01/30/22  5:04 PM  Result Value Ref Range   ABO/RH(D) A POS    Antibody Screen NEG    Sample Expiration      02/02/2022,2359 Performed at [facility], [address] [place], [place] [address]   Ethanol     Status: Abnormal   Collection Time: 01/30/22  5:04 PM  Result Value Ref Range   Alcohol, Ethyl (B) 360 (HH) <10 mg/dL    Comment: CRITICAL RESULT CALLED TO, READ BACK BY AND VERIFIED WITH Glynn Octave, RN 01/30/22 1819 J. COLE (NOTE) Lowest detectable limit for serum alcohol is 10 mg/dL.  For medical purposes only. Performed at [facility], [address], [place], [place] [address]   Ammonia     Status: Abnormal   Collection Time: 01/30/22  5:04 PM  Result Value Ref Range   Ammonia 36 (H) 9 - 35 umol/L    Comment: Performed at [facility], [address] [place], [place] [address]  Lipase, blood     Status: Abnormal   Collection Time: 01/30/22  5:04 PM  Result Value Ref Range   Lipase 54 (H) 11 - 51 U/L    Comment: Performed at [facility], [address] [place], [place] [address]  Troponin I (High Sensitivity)     Status: None   Collection Time: 01/30/22  7:06 PM  Result Value Ref Range   Troponin I (High Sensitivity) 11 <18 ng/L    Comment: (NOTE) Elevated high sensitivity troponin I (hsTnI) values and significant  changes across serial measurements may suggest ACS but many other  chronic and acute conditions are known to elevate hsTnI results.  Refer to the "Links" section for chest pain algorithms and additional  guidance. Performed at [facility], [address] [place], [place] [address]     CT Head Wo Contrast  Result Date: 01/30/2022 CLINICAL DATA:  Trauma EXAM: CT HEAD WITHOUT CONTRAST CT MAXILLOFACIAL WITHOUT CONTRAST CT CERVICAL SPINE WITHOUT CONTRAST TECHNIQUE: Multidetector CT imaging of the head, cervical spine, and maxillofacial structures were performed using the standard protocol without intravenous contrast. Multiplanar CT image reconstructions of the cervical spine and maxillofacial structures were also generated. RADIATION DOSE REDUCTION: This exam was performed according to the departmental dose-optimization program which includes automated exposure control, adjustment of the mA and/or kV according to patient size and/or use of iterative reconstruction technique. COMPARISON:  CT [place] 08/26/20, MRI brain 08/26/20 FINDINGS: CT HEAD FINDINGS Brain: No evidence of acute infarction, hemorrhage, hydrocephalus, extra-axial collection or mass lesion/mass effect. Vascular: No hyperdense vessel or unexpected calcification. Skull: Normal. Negative for fracture or focal lesion. Other: Soft tissue injury at the vertex. CT MAXILLOFACIAL FINDINGS Osseous: No fracture or mandibular dislocation. No destructive process. Orbits: Negative. No traumatic or inflammatory finding. Sinuses: Mucosal thickening left maxillary sinus Soft tissues: Negative. CT CERVICAL SPINE FINDINGS Alignment: There is trace anterolisthesis of C6 on C7. Mild retrolisthesis of C3 on C4 and C4 on C5. Skull base and vertebrae: There is a subtle nondisplaced fracture through the superior articular facet at C7 on the right. Soft tissues and spinal canal: No prevertebral fluid or swelling. No visible canal hematoma. Disc levels: Mild spinal canal narrowing at C2-C3 and C4-C5. Upper chest: Negative. Other: None IMPRESSION: 1. No CT evidence of intracranial injury. 2. Possible subtle nondisplaced fracture through the superior articular facet at C7 on the right. 3. No evidence of facial bone fracture. Electronically Signed   By: Lorenza Cambridge M.D.   On: 01/30/2022 19:04   CT Cervical Spine Wo Contrast  Result Date: 01/30/2022 CLINICAL DATA:  Trauma EXAM: CT HEAD WITHOUT CONTRAST CT MAXILLOFACIAL WITHOUT CONTRAST CT CERVICAL SPINE WITHOUT CONTRAST TECHNIQUE: Multidetector  CT imaging of the head, cervical spine, and maxillofacial structures were performed using the standard protocol without intravenous contrast. Multiplanar CT image reconstructions of the cervical spine and maxillofacial structures were also generated. RADIATION DOSE REDUCTION: This exam was performed according to the departmental dose-optimization program which includes automated exposure control, adjustment of the mA and/or kV according to patient size and/or use of iterative reconstruction technique. COMPARISON:  CT [place] 08/26/20, MRI brain 08/26/20 FINDINGS: CT HEAD FINDINGS Brain: No evidence of acute infarction, hemorrhage, hydrocephalus, extra-axial collection or mass lesion/mass effect. Vascular: No hyperdense vessel or unexpected calcification. Skull: Normal. Negative for fracture or focal lesion. Other: Soft tissue injury at the vertex. CT MAXILLOFACIAL FINDINGS Osseous: No fracture or mandibular dislocation. No destructive process. Orbits: Negative. No traumatic or inflammatory finding. Sinuses: Mucosal thickening left maxillary sinus Soft tissues: Negative. CT CERVICAL SPINE FINDINGS Alignment: There is trace anterolisthesis of C6 on C7. Mild retrolisthesis of C3 on C4 and C4 on C5. Skull base and vertebrae: There is a subtle nondisplaced fracture through the superior articular facet at C7 on the right. Soft tissues and spinal canal: No prevertebral fluid or swelling. No visible canal hematoma. Disc levels: Mild spinal canal narrowing at C2-C3 and C4-C5. Upper chest: Negative. Other: None IMPRESSION: 1. No CT evidence of intracranial injury. 2. Possible subtle nondisplaced fracture through the superior articular facet at C7 on the right. 3. No evidence of facial bone  fracture. Electronically Signed   By: Lorenza Cambridge M.D.   On: 01/30/2022 19:04   CT Maxillofacial Wo Contrast  Result Date: 01/30/2022 CLINICAL DATA:  Trauma EXAM: CT HEAD WITHOUT CONTRAST CT MAXILLOFACIAL WITHOUT CONTRAST CT CERVICAL SPINE WITHOUT CONTRAST TECHNIQUE: Multidetector CT imaging of the head, cervical spine, and maxillofacial structures were performed using the standard protocol without intravenous contrast. Multiplanar CT image reconstructions of the cervical spine and maxillofacial structures were also generated. RADIATION DOSE REDUCTION: This exam was performed according to the departmental dose-optimization program which includes automated exposure control, adjustment of the mA and/or kV according to patient size and/or use of iterative reconstruction technique. COMPARISON:  CT [place] 08/26/20, MRI brain 08/26/20 FINDINGS: CT HEAD FINDINGS Brain: No evidence of acute infarction, hemorrhage, hydrocephalus, extra-axial collection or mass lesion/mass effect. Vascular: No hyperdense vessel or unexpected calcification. Skull: Normal. Negative for fracture or focal lesion. Other: Soft tissue injury at the vertex. CT MAXILLOFACIAL FINDINGS Osseous: No fracture or mandibular dislocation. No destructive process. Orbits: Negative. No traumatic or inflammatory finding. Sinuses: Mucosal thickening left maxillary sinus Soft tissues: Negative. CT CERVICAL SPINE FINDINGS Alignment: There is trace anterolisthesis of C6 on C7. Mild retrolisthesis of C3 on C4 and C4 on C5. Skull base and vertebrae: There is a subtle nondisplaced fracture through the superior articular facet at C7 on the right. Soft tissues and spinal canal: No prevertebral fluid or swelling. No visible canal hematoma. Disc levels: Mild spinal canal narrowing at C2-C3 and C4-C5. Upper chest: Negative. Other: None IMPRESSION: 1. No CT evidence of intracranial injury. 2. Possible subtle nondisplaced fracture through the superior articular facet at C7  on the right. 3. No evidence of facial bone fracture. Electronically Signed   By: Lorenza Cambridge M.D.   On: 01/30/2022 19:04   CT ANGIO ABDOMEN PELVIS  W &/OR WO CONTRAST  Result Date: 01/30/2022 CLINICAL DATA:  Alcohol abuse, vomiting, rectal bleeding EXAM: CTA ABDOMEN AND PELVIS WITHOUT AND WITH CONTRAST TECHNIQUE: Multidetector CT imaging of the abdomen and pelvis was performed using the standard protocol during bolus administration  of intravenous contrast. Multiplanar reconstructed images and MIPs were obtained and reviewed to evaluate the vascular anatomy. RADIATION DOSE REDUCTION: This exam was performed according to the departmental dose-optimization program which includes automated exposure control, adjustment of the mA and/or kV according to patient size and/or use of iterative reconstruction technique. CONTRAST:  OMNIPAQUE IOHEXOL 350 MG/ML SOLN COMPARISON:  12/25/2021 FINDINGS: VASCULAR Aorta: Normal caliber aorta without aneurysm, dissection, vasculitis or significant stenosis. Mild atherosclerosis. Celiac: Patent without evidence of aneurysm, dissection, vasculitis or significant stenosis. SMA: Patent without evidence of aneurysm, dissection, vasculitis or significant stenosis. Mild atherosclerosis. Renals: Both renal arteries are patent without evidence of aneurysm, dissection, vasculitis, fibromuscular dysplasia or significant stenosis. Mild atherosclerosis. IMA: Patent without evidence of aneurysm, dissection, vasculitis or significant stenosis. Inflow: Patent without evidence of aneurysm, dissection, vasculitis or significant stenosis. Diffuse atherosclerosis without focal stenosis. Proximal Outflow: Bilateral common femoral and visualized portions of the superficial and profunda femoral arteries are patent without evidence of aneurysm, dissection, vasculitis or significant stenosis. Mild atherosclerosis. Veins: No obvious venous abnormality within the limitations of this arterial phase  study. Review of the MIP images confirms the above findings. NON-VASCULAR Lower chest: No acute pleural or parenchymal lung disease. Hepatobiliary: Hepatic steatosis. No biliary duct dilation. Gallbladder is unremarkable. Pancreas: Unremarkable. No pancreatic ductal dilatation or surrounding inflammatory changes. Spleen: Normal in size without focal abnormality. Adrenals/Urinary Tract: Adrenal glands are unremarkable. Kidneys are normal, without renal calculi, focal lesion, or hydronephrosis. Bladder is unremarkable. Stomach/Bowel: No bowel obstruction or ileus. No bowel wall thickening or inflammatory change. No intraluminal accumulation of contrast to suggest active gastrointestinal bleeding. Lymphatic: No pathologic adenopathy within the abdomen or pelvis. Reproductive: Nodular enlargement of the median lobe of the prostate unchanged. Other: No free fluid or free intraperitoneal gas. Prior umbilical hernia repair. There is a small fat containing supraumbilical midline ventral hernia again noted. No bowel herniation. Musculoskeletal: No acute or destructive bony lesions. Reconstructed images demonstrate no additional findings. IMPRESSION: VASCULAR 1. No evidence of active gastrointestinal bleeding. 2.  Aortic Atherosclerosis (ICD10-I70.0). NON-VASCULAR 1. Hepatic steatosis. 2. No acute intra-abdominal or intrapelvic process. 3. Stable nodular enlargement of the prostate. 4. Stable midline supraumbilical fat containing ventral hernia. Electronically Signed   By: Sharlet Salina M.D.   On: 01/30/2022 19:02   DG Chest 2 View  Result Date: 01/30/2022 CLINICAL DATA:  Altered mental status. EXAM: CHEST - 2 VIEW COMPARISON:  December 06, 2021. FINDINGS: The heart size and mediastinal contours are within normal limits. Both lungs are clear. The visualized skeletal structures are unremarkable. IMPRESSION: No active cardiopulmonary disease. Electronically Signed   By: Lupita Raider M.D.   On: 01/30/2022 18:17     Pending Labs Unresulted Labs (From admission, onward)     Start     Ordered   01/31/22 0500  Comprehensive metabolic panel  Tomorrow morning,   R        01/30/22 2026   01/30/22 2027  Magnesium  Once,   R        01/30/22 2026   01/30/22 2026  CBC  Now then every 6 hours,   R (with TIMED occurrences)      01/30/22 2026   01/30/22 1650  Occult blood card to lab, stool  Once,   URGENT        01/30/22 1649            Vitals/Pain Today's Vitals   01/30/22 1900 01/30/22 2016 01/30/22 2030 01/30/22 2130  BP: 124/82  Marland Kitchen)  147/92 (!) 141/88  Pulse: 91  90 91  Resp: Temp:  98.4 F (36.9 C)    TempSrc:      SpO2: 90%  92% 90%    Isolation Precautions No active isolations  Medications Medications  potassium chloride 10 mEq in 100 mL IVPB (10 mEq Intravenous New Bag/Given 01/30/22 2149)  pantoprazole (PROTONIX) injection 40 mg (has no administration in time range)  potassium chloride 10 mEq in 100 mL IVPB (has no administration in time range)  0.9 %  sodium chloride infusion ( Intravenous New Bag/Given 01/30/22 2151)  LORazepam (ATIVAN) tablet 1-4 mg (has no administration in time range)    Or  LORazepam (ATIVAN) injection 1-4 mg (has no administration in time range)  thiamine (VITAMIN B1) tablet 100 mg (100 mg Oral Given 01/30/22 2150)    Or  thiamine (VITAMIN B1) injection 100 mg ( Intravenous See Alternative 01/30/22 2150)  folic acid (FOLVITE) tablet 1 mg (1 mg Oral Given 01/30/22 2150)  multivitamin with minerals tablet 1 tablet (1 tablet Oral Given 01/30/22 2149)  sodium chloride 0.9 % bolus 1,000 mL (1,000 mLs Intravenous New Bag/Given 01/30/22 1853)  pantoprazole (PROTONIX) injection 40 mg (40 mg Intravenous Given 01/30/22 1849)  iohexol (OMNIPAQUE) 350 MG/ML injection 100 mL (100 mLs Intravenous Contrast Given 01/30/22 1828)    Mobility Walks Low fall risk   Focused Assessments    R Recommendations: See Admitting Provider Note  Report given to:    Additional Notes:

## 2022-01-30 NOTE — Progress Notes (Cosign Needed)
Pt seen by Dr [name].  He missed his lab appt, was confused and went at the wrong time.  He is willing to go tomorrow am to get lab work, we will [employer] know, he was given bus passes. He has an in-person appt on Jan 19.   Myriam Jacobson filled his med box before she left, but he has not taken any meds.  He said he does not know what is in the boxes.  He had an airplane bottle last pm about 7:30, has not had any today.   He says will not get any bottles, because he does not have any money.   He also says if he gets any money, he will go to the liquor store.   The lorazepam was not in his med box, it was added.   The meds were explained to him, compliance is encouraged. He has meds to take 4 x day.   The Zofran was labeled and put on top, because he has nausea every day at some point.    Theodore Demark, PA-C 01/30/2022 3:27 PM  Addendum: shortly after DC From clinic the client fell in the parking lot with facial bruise and nose bleed.  He was transported to ED and admitted for neck fracture , bleeding and multiple electrolyte disorders Shan Levans

## 2022-01-31 ENCOUNTER — Other Ambulatory Visit: Payer: Commercial Managed Care - HMO

## 2022-01-31 DIAGNOSIS — F1020 Alcohol dependence, uncomplicated: Secondary | ICD-10-CM | POA: Diagnosis not present

## 2022-01-31 DIAGNOSIS — D61818 Other pancytopenia: Secondary | ICD-10-CM | POA: Diagnosis present

## 2022-01-31 DIAGNOSIS — I10 Essential (primary) hypertension: Secondary | ICD-10-CM

## 2022-01-31 DIAGNOSIS — K922 Gastrointestinal hemorrhage, unspecified: Secondary | ICD-10-CM | POA: Diagnosis present

## 2022-01-31 DIAGNOSIS — S12601A Unspecified nondisplaced fracture of seventh cervical vertebra, initial encounter for closed fracture: Secondary | ICD-10-CM | POA: Diagnosis present

## 2022-01-31 DIAGNOSIS — K8681 Exocrine pancreatic insufficiency: Secondary | ICD-10-CM | POA: Diagnosis present

## 2022-01-31 DIAGNOSIS — Z5900 Homelessness unspecified: Secondary | ICD-10-CM | POA: Diagnosis not present

## 2022-01-31 DIAGNOSIS — K920 Hematemesis: Secondary | ICD-10-CM

## 2022-01-31 DIAGNOSIS — Z79899 Other long term (current) drug therapy: Secondary | ICD-10-CM | POA: Diagnosis not present

## 2022-01-31 DIAGNOSIS — S129XXA Fracture of neck, unspecified, initial encounter: Secondary | ICD-10-CM

## 2022-01-31 DIAGNOSIS — K760 Fatty (change of) liver, not elsewhere classified: Secondary | ICD-10-CM | POA: Diagnosis present

## 2022-01-31 DIAGNOSIS — W1830XA Fall on same level, unspecified, initial encounter: Secondary | ICD-10-CM | POA: Diagnosis present

## 2022-01-31 DIAGNOSIS — Y908 Blood alcohol level of 240 mg/100 ml or more: Secondary | ICD-10-CM | POA: Diagnosis present

## 2022-01-31 DIAGNOSIS — K863 Pseudocyst of pancreas: Secondary | ICD-10-CM | POA: Diagnosis present

## 2022-01-31 DIAGNOSIS — K625 Hemorrhage of anus and rectum: Secondary | ICD-10-CM

## 2022-01-31 DIAGNOSIS — K449 Diaphragmatic hernia without obstruction or gangrene: Secondary | ICD-10-CM | POA: Diagnosis present

## 2022-01-31 DIAGNOSIS — K861 Other chronic pancreatitis: Secondary | ICD-10-CM | POA: Diagnosis present

## 2022-01-31 DIAGNOSIS — E876 Hypokalemia: Secondary | ICD-10-CM | POA: Diagnosis present

## 2022-01-31 DIAGNOSIS — Y92481 Parking lot as the place of occurrence of the external cause: Secondary | ICD-10-CM | POA: Diagnosis not present

## 2022-01-31 DIAGNOSIS — F10931 Alcohol use, unspecified with withdrawal delirium: Secondary | ICD-10-CM | POA: Diagnosis not present

## 2022-01-31 DIAGNOSIS — F10220 Alcohol dependence with intoxication, uncomplicated: Secondary | ICD-10-CM | POA: Diagnosis present

## 2022-01-31 LAB — COMPREHENSIVE METABOLIC PANEL
ALT: 31 U/L (ref 0–44)
AST: 120 U/L — ABNORMAL HIGH (ref 15–41)
Albumin: 3.3 g/dL — ABNORMAL LOW (ref 3.5–5.0)
Alkaline Phosphatase: 87 U/L (ref 38–126)
Anion gap: 12 (ref 5–15)
BUN: 5 mg/dL — ABNORMAL LOW (ref 6–20)
CO2: 24 mmol/L (ref 22–32)
Calcium: 8.3 mg/dL — ABNORMAL LOW (ref 8.9–10.3)
Chloride: 105 mmol/L (ref 98–111)
Creatinine, Ser: 0.57 mg/dL — ABNORMAL LOW (ref 0.61–1.24)
GFR, Estimated: >60 mL/min (ref >60)
Glucose, Bld: 79 mg/dL (ref 70–99)
Potassium: 3.3 mmol/L — ABNORMAL LOW (ref 3.5–5.1)
Sodium: 141 mmol/L (ref 135–145)
Total Bilirubin: 0.9 mg/dL (ref 0.3–1.2)
Total Protein: 7.7 g/dL (ref 6.5–8.1)

## 2022-01-31 LAB — MAGNESIUM: Magnesium: 1.4 mg/dL — ABNORMAL LOW (ref 1.7–2.4)

## 2022-01-31 LAB — CBC
HCT: 34.1 % — ABNORMAL LOW (ref 39.0–52.0)
HCT: 34.5 % — ABNORMAL LOW (ref 39.0–52.0)
Hemoglobin: 11.1 g/dL — ABNORMAL LOW (ref 13.0–17.0)
Hemoglobin: 11.4 g/dL — ABNORMAL LOW (ref 13.0–17.0)
MCH: 30.6 pg (ref 26.0–34.0)
MCH: 30.8 pg (ref 26.0–34.0)
MCHC: 32.6 g/dL (ref 30.0–36.0)
MCHC: 33 g/dL (ref 30.0–36.0)
MCV: 93.2 fL (ref 80.0–100.0)
MCV: 93.9 fL (ref 80.0–100.0)
Platelets: 124 10*3/uL — ABNORMAL LOW (ref 150–400)
Platelets: 133 10*3/uL — ABNORMAL LOW (ref 150–400)
RBC: 3.63 MIL/uL — ABNORMAL LOW (ref 4.22–5.81)
RBC: 3.7 MIL/uL — ABNORMAL LOW (ref 4.22–5.81)
RDW: 16.3 % — ABNORMAL HIGH (ref 11.5–15.5)
RDW: 16.7 % — ABNORMAL HIGH (ref 11.5–15.5)
WBC: 2.5 10*3/uL — ABNORMAL LOW (ref 4.0–10.5)
WBC: 2.6 10*3/uL — ABNORMAL LOW (ref 4.0–10.5)
nRBC: 0 % (ref 0.0–0.2)
nRBC: 0 % (ref 0.0–0.2)

## 2022-01-31 LAB — MRSA NEXT GEN BY PCR, NASAL: MRSA by PCR Next Gen: NOT DETECTED

## 2022-01-31 LAB — TROPONIN I (HIGH SENSITIVITY): Troponin I (High Sensitivity): 12 ng/L (ref <18)

## 2022-01-31 MED ORDER — ORAL CARE MOUTH RINSE: 15.0000 mL | OROMUCOSAL | Status: DC | PRN

## 2022-01-31 MED ORDER — AMLODIPINE BESYLATE 5 MG PO TABS
5.0000 mg | ORAL_TABLET | Freq: Every day | ORAL | Status: DC
  Administered 2022-01-31: 5 mg via ORAL
  Filled 2022-01-31 (×2): qty 1

## 2022-01-31 MED ORDER — MAGNESIUM SULFATE 2 GM/50ML IV SOLN
2.0000 g | Freq: Once | INTRAVENOUS | Status: AC
  Administered 2022-01-31: 2 g via INTRAVENOUS
  Filled 2022-01-31: qty 50

## 2022-01-31 MED ORDER — HYDRALAZINE HCL 20 MG/ML IJ SOLN
10.0000 mg | INTRAMUSCULAR | Status: DC | PRN
  Administered 2022-01-31 – 2022-02-01 (×2): 10 mg via INTRAVENOUS
  Filled 2022-01-31 (×2): qty 1

## 2022-01-31 MED ORDER — ORAL CARE MOUTH RINSE
15.0000 mL | OROMUCOSAL | Status: DC
  Administered 2022-01-31 – 2022-02-01 (×6): 15 mL via OROMUCOSAL

## 2022-01-31 MED ORDER — POTASSIUM CHLORIDE 10 MEQ/100ML IV SOLN: 10.0000 meq | INTRAVENOUS | Status: DC

## 2022-01-31 MED ORDER — POTASSIUM CHLORIDE 10 MEQ/100ML IV SOLN
10.0000 meq | INTRAVENOUS | Status: AC
  Administered 2022-01-31 (×4): 10 meq via INTRAVENOUS
  Filled 2022-01-31: qty 100

## 2022-01-31 MED ORDER — LABETALOL HCL 5 MG/ML IV SOLN
10.0000 mg | INTRAVENOUS | Status: DC | PRN
  Administered 2022-01-31 – 2022-02-01 (×3): 10 mg via INTRAVENOUS
  Filled 2022-01-31 (×3): qty 4

## 2022-01-31 MED ORDER — HALOPERIDOL LACTATE 5 MG/ML IJ SOLN
5.0000 mg | Freq: Four times a day (QID) | INTRAMUSCULAR | Status: DC | PRN
  Administered 2022-01-31 – 2022-02-01 (×3): 5 mg via INTRAVENOUS
  Filled 2022-01-31 (×3): qty 1

## 2022-01-31 MED ORDER — DEXMEDETOMIDINE HCL IN NACL 200 MCG/50ML IV SOLN
0.4000 ug/kg/h | INTRAVENOUS | Status: DC
  Administered 2022-01-31: 0.7 ug/kg/h via INTRAVENOUS
  Administered 2022-01-31: 0.4 ug/kg/h via INTRAVENOUS
  Administered 2022-01-31: 0.7 ug/kg/h via INTRAVENOUS
  Administered 2022-02-01 (×2): 0.9 ug/kg/h via INTRAVENOUS
  Filled 2022-01-31 (×6): qty 50

## 2022-01-31 MED ORDER — MORPHINE SULFATE (PF) 2 MG/ML IV SOLN
1.0000 mg | Freq: Once | INTRAVENOUS | Status: AC
  Administered 2022-01-31: 1 mg via INTRAVENOUS
  Filled 2022-01-31: qty 1

## 2022-01-31 MED ORDER — MORPHINE SULFATE (PF) 2 MG/ML IV SOLN
0.5000 mg | Freq: Once | INTRAVENOUS | Status: AC
  Administered 2022-01-31: 0.5 mg via INTRAVENOUS
  Filled 2022-01-31: qty 1

## 2022-01-31 MED ORDER — CHLORHEXIDINE GLUCONATE CLOTH 2 % EX PADS
6.0000 | MEDICATED_PAD | Freq: Every day | CUTANEOUS | Status: DC
  Administered 2022-01-31 – 2022-02-01 (×3): 6 via TOPICAL

## 2022-01-31 NOTE — Progress Notes (Signed)
Progress Note    Samuel HockeyRobert Blevins   ZOX:[id number]  DOB: 02-Feb-1966  DOA: 01/30/2022     0 PCP: Samuel FriskWright, Patrick E, MD  Initial CC: Fall in parking lot  [facility] [name] is a 56 yo male with PMH alcohol misuse disorder, chronic pancreatitis, pancreatic pseudocyst, chronic pancreatic insufficiency, hypertension, chronic anemia and thrombocytopenia, homelessness who presented to the ED after a fall.  He had also reported an episode of bloody emesis and dark stools prior to admission.  He has had prior EGDs during similar evaluations for hematemesis. He does continue to consume alcohol and resides [facility]. Hemoglobin is stable and at baseline on admission.  GI was consulted for further evaluation.  Interval History:  Patient laying in bed when seen this morning comfortable but still with some upper extremity tremoring noted at rest.  Mood is cooperative.  States his last drink was on Tuesday. Denies any further hematemesis or dark stools since admission.  Assessment and Plan: * GI bleed - Prior history noted similar with hematemesis.  He also describes dark stools and has not been able to have outpt colonoscopy due to homelessness - continues to consume alcohol - last EGD June 2023 noted with peptic duodenitis and reactive gastropathy with mild chronic gastritis -Currently hemoglobin stable.  Continue trending; no further episodes since admission - GI following, appreciate assistance.  Tentative plan for more what sounds like inpatient EGD.  Patient may also benefit from colonoscopy if having difficulty obtaining outpatient; decision deferred to GI  Cervical spine fracture (HCC) - s/p mechanical fall also with contribution from alcohol intoxication -CT C-spine shows "possible subtle nondisplaced fracture through the superior articular facet of C7 on the right" -Neurosurgery recommending c-collar and outpatient follow-up  Alcohol dependence syndrome (HCC) - Ethanol  level 360 on admission -Patient endorsed that his last drink was Tuesday evening - Continue CIWA protocol -Continue thiamine, folate, multivitamin  Pancytopenia (HCC) - Again, suspected due to bone marrow suppression from chronic alcohol use  Hypokalemia - Replete as needed  Thrombocytopenia (HCC) - Likely due to bone marrow suppression from chronic alcohol use - Continue trending  Essential hypertension - Resume amlodipine  Transaminitis - Due to chronic alcohol use - Continue trending   Old records reviewed in assessment of this patient  Antimicrobials:   DVT prophylaxis:  SCDs Start: 01/30/22 2026   Code Status:   Code Status: Full Code  Mobility Assessment (last 72 hours)     Mobility Assessment   No documentation.           Barriers to discharge:  Disposition Plan: Home Status is: Inpatient  Objective: Blood pressure (!) 148/79, pulse 88, temperature 98.1 F (36.7 C), temperature source Oral, resp. rate 15, height 5\' 9"  (1.753 m), weight 79.8 kg, SpO2 96 %.  Examination:  Physical Exam Constitutional:      General: He is not in acute distress.    Appearance: Normal appearance.  HENT:     Head: Normocephalic and atraumatic.     Mouth/Throat:     Mouth: Mucous membranes are moist.  Eyes:     Extraocular Movements: Extraocular movements intact.  Cardiovascular:     Rate and Rhythm: Normal rate and regular rhythm.     Heart sounds: Normal heart sounds.  Pulmonary:     Effort: Pulmonary effort is normal. No respiratory distress.     Breath sounds: Normal breath sounds. No wheezing.  Abdominal:     General: Bowel sounds are normal. There is  no distension.     Palpations: Abdomen is soft.     Comments: Mild nonspecific tenderness throughout; no rebound/guarding  Musculoskeletal:        General: Normal range of motion.     Cervical back: Normal range of motion and neck supple.  Skin:    General: Skin is warm and dry.  Neurological:      Mental Status: He is alert.     Comments: Mild resting tremor appreciated in hands bilaterally  Psychiatric:        Mood and Affect: Mood normal.        Behavior: Behavior normal.      Consultants:  GI  Procedures:    Data Reviewed: Results for orders placed or performed during the hospital encounter of 01/30/22 (from the past 24 hour(s))  CBG monitoring, ED     Status: None   Collection Time: 01/30/22  4:56 PM  Result Value Ref Range   Glucose-Capillary 88 70 - 99 mg/dL  CBC with Differential     Status: Abnormal   Collection Time: 01/30/22  5:04 PM  Result Value Ref Range   WBC 3.8 (L) 4.0 - 10.5 K/uL   RBC 4.08 (L) 4.22 - 5.81 MIL/uL   Hemoglobin 12.4 (L) 13.0 - 17.0 g/dL   HCT 26.2 (L) 03.5 - 59.7 %   MCV 95.1 80.0 - 100.0 fL   MCH 30.4 26.0 - 34.0 pg   MCHC 32.0 30.0 - 36.0 g/dL   RDW 41.6 (H) 38.4 - 53.6 %   Platelets 147 (L) 150 - 400 K/uL   nRBC 0.0 0.0 - 0.2 %   Neutrophils Relative % 60 %   Neutro Abs 2.3 1.7 - 7.7 K/uL   Lymphocytes Relative 31 %   Lymphs Abs 1.2 0.7 - 4.0 K/uL   Monocytes Relative 8 %   Monocytes Absolute 0.3 0.1 - 1.0 K/uL   Eosinophils Relative 0 %   Eosinophils Absolute 0.0 0.0 - 0.5 K/uL   Basophils Relative 1 %   Basophils Absolute 0.0 0.0 - 0.1 K/uL   Immature Granulocytes 0 %   Abs Immature Granulocytes 0.01 0.00 - 0.07 K/uL  Comprehensive metabolic panel     Status: Abnormal   Collection Time: 01/30/22  5:04 PM  Result Value Ref Range   Sodium 139 135 - 145 mmol/L   Potassium 2.8 (L) 3.5 - 5.1 mmol/L   Chloride 103 98 - 111 mmol/L   CO2 22 22 - 32 mmol/L   Glucose, Bld 87 70 - 99 mg/dL   BUN 6 6 - 20 mg/dL   Creatinine, Ser 4.68 (L) 0.61 - 1.24 mg/dL   Calcium 8.8 (L) 8.9 - 10.3 mg/dL   Total Protein 8.5 (H) 6.5 - 8.1 g/dL   Albumin 3.7 3.5 - 5.0 g/dL   AST 032 (H) 15 - 41 U/L   ALT 32 0 - 44 U/L   Alkaline Phosphatase 98 38 - 126 U/L   Total Bilirubin 1.0 0.3 - 1.2 mg/dL   GFR, Estimated >12 >24 mL/min   Anion gap 14  5 - 15  Protime-INR     Status: None   Collection Time: 01/30/22  5:04 PM  Result Value Ref Range   Prothrombin Time 13.0 11.4 - 15.2 seconds   INR 1.0 0.8 - 1.2  Type and screen [facility]     Status: None   Collection Time: 01/30/22  5:04 PM  Result Value Ref Range   ABO/RH(D) A  POS    Antibody Screen NEG    Sample Expiration      02/02/2022,2359 Performed at [facility], [address] [place], [place] [address]   Ethanol     Status: Abnormal   Collection Time: 01/30/22  5:04 PM  Result Value Ref Range   Alcohol, Ethyl (B) 360 (HH) <10 mg/dL  Ammonia     Status: Abnormal   Collection Time: 01/30/22  5:04 PM  Result Value Ref Range   Ammonia 36 (H) 9 - 35 umol/L  Lipase, blood     Status: Abnormal   Collection Time: 01/30/22  5:04 PM  Result Value Ref Range   Lipase 54 (H) 11 - 51 U/L  Troponin I (High Sensitivity)     Status: None   Collection Time: 01/30/22  7:06 PM  Result Value Ref Range   Troponin I (High Sensitivity) 11 <18 ng/L  Troponin I (High Sensitivity)     Status: None   Collection Time: 01/30/22 11:53 PM  Result Value Ref Range   Troponin I (High Sensitivity) 12 <18 ng/L  CBC     Status: Abnormal   Collection Time: 01/30/22 11:53 PM  Result Value Ref Range   WBC 2.6 (L) 4.0 - 10.5 K/uL   RBC 3.70 (L) 4.22 - 5.81 MIL/uL   Hemoglobin 11.4 (L) 13.0 - 17.0 g/dL   HCT 37.8 (L) 58.8 - 50.2 %   MCV 93.2 80.0 - 100.0 fL   MCH 30.8 26.0 - 34.0 pg   MCHC 33.0 30.0 - 36.0 g/dL   RDW 77.4 (H) 12.8 - 78.6 %   Platelets 133 (L) 150 - 400 K/uL   nRBC 0.0 0.0 - 0.2 %  Magnesium     Status: Abnormal   Collection Time: 01/30/22 11:53 PM  Result Value Ref Range   Magnesium 1.4 (L) 1.7 - 2.4 mg/dL  MRSA Next Gen by PCR, Nasal     Status: None   Collection Time: 01/31/22  1:04 AM   Specimen: Nasal Mucosa; Nasal Swab  Result Value Ref Range   MRSA by PCR Next Gen NOT DETECTED NOT DETECTED  CBC     Status: Abnormal    Collection Time: 01/31/22  3:02 AM  Result Value Ref Range   WBC 2.5 (L) 4.0 - 10.5 K/uL   RBC 3.63 (L) 4.22 - 5.81 MIL/uL   Hemoglobin 11.1 (L) 13.0 - 17.0 g/dL   HCT 76.7 (L) 20.9 - 47.0 %   MCV 93.9 80.0 - 100.0 fL   MCH 30.6 26.0 - 34.0 pg   MCHC 32.6 30.0 - 36.0 g/dL   RDW 96.2 (H) 83.6 - 62.9 %   Platelets 124 (L) 150 - 400 K/uL   nRBC 0.0 0.0 - 0.2 %  Comprehensive metabolic panel     Status: Abnormal   Collection Time: 01/31/22  3:02 AM  Result Value Ref Range   Sodium 141 135 - 145 mmol/L   Potassium 3.3 (L) 3.5 - 5.1 mmol/L   Chloride 105 98 - 111 mmol/L   CO2 24 22 - 32 mmol/L   Glucose, Bld 79 70 - 99 mg/dL   BUN 5 (L) 6 - 20 mg/dL   Creatinine, Ser 4.76 (L) 0.61 - 1.24 mg/dL   Calcium 8.3 (L) 8.9 - 10.3 mg/dL   Total Protein 7.7 6.5 - 8.1 g/dL   Albumin 3.3 (L) 3.5 - 5.0 g/dL   AST 546 (H) 15 - 41 U/L   ALT 31 0 -  44 U/L   Alkaline Phosphatase 87 38 - 126 U/L   Total Bilirubin 0.9 0.3 - 1.2 mg/dL   GFR, Estimated >25 >95 mL/min   Anion gap 12 5 - 15    I have Reviewed nursing notes, Vitals, and Lab results since pt's last encounter. Pertinent lab results : see above I have ordered test including BMP, CBC, Mg I have reviewed the last note from staff over past 24 hours I have discussed pt's care plan and test results with nursing staff, case manager  Time spent: Greater than 50% of the 55 minute visit was spent in counseling/coordination of care for the patient as laid out in the A&P.    LOS: 0 days   Lewie Chamber, MD [facility] 01/31/2022, 1:04 PM

## 2022-01-31 NOTE — Assessment & Plan Note (Addendum)
-   Ethanol level 360 on admission -Patient endorsed that his last drink was Tuesday evening - Continue CIWA protocol -Continue thiamine, folate, multivitamin

## 2022-01-31 NOTE — Assessment & Plan Note (Signed)
-   Prior history noted similar with hematemesis.  He also describes dark stools and has not been able to have outpt colonoscopy due to homelessness - continues to consume alcohol - last EGD June 2023 noted with peptic duodenitis and reactive gastropathy with mild chronic gastritis -Currently hemoglobin stable.  Continue trending; no further episodes since admission - GI following, appreciate assistance.  Tentative plan for more what sounds like inpatient EGD.  Patient may also benefit from colonoscopy if having difficulty obtaining outpatient; decision deferred to GI

## 2022-01-31 NOTE — Assessment & Plan Note (Signed)
-   Due to chronic alcohol use - Continue trending

## 2022-01-31 NOTE — Assessment & Plan Note (Signed)
-   Again, suspected due to bone marrow suppression from chronic alcohol use

## 2022-01-31 NOTE — Assessment & Plan Note (Signed)
-   s/p mechanical fall also with contribution from alcohol intoxication -CT C-spine shows "possible subtle nondisplaced fracture through the superior articular facet of C7 on the right" -Neurosurgery recommending c-collar and outpatient follow-up

## 2022-01-31 NOTE — Hospital Course (Addendum)
Samuel Blevins is a 56 yo male with PMH alcohol misuse disorder, chronic pancreatitis, pancreatic pseudocyst, chronic pancreatic insufficiency, hypertension, chronic anemia and thrombocytopenia, homelessness who presented to the ED after a fall.  He had also reported an episode of bloody emesis and dark stools prior to admission.  He has had prior EGDs during similar evaluations for hematemesis. He does continue to consume alcohol and resides at [facility]. Hemoglobin is stable and at baseline on admission.  GI was consulted for further evaluation.  GI bleed workup was placed on hold in setting of his ensuing alcohol withdrawal. He required escalation up to Precedex drip along with phenobarb taper. Despite use with Ativan and Haldol as well, he continued to have ongoing withdrawal symptoms. He was deemed to have capacity and was insistent on leaving AMA multiple times. Risks and benefits were discussed prior to patient leaving and he still insisted on leaving.  He was given contact information for [place] [facility] for following up regarding his suspected C7 fracture.  Cervical collar in place at time of discharge. Despite trying to encourage patient to remain in the hospital, he still chose to leave AMA.

## 2022-01-31 NOTE — Progress Notes (Signed)
Orthopedic Tech Progress Note Patient Details:  Samuel Blevins Feb 01, 1966 [id number] Called in order for Aspen collar to [facility] Patient ID: Samuel Blevins, male   DOB: 1965/06/09, 56 y.o.   MRN: [id number]  Samuel Blevins 01/31/2022, 8:20 AM

## 2022-01-31 NOTE — Assessment & Plan Note (Signed)
-   Likely due to bone marrow suppression from chronic alcohol use - Continue trending

## 2022-01-31 NOTE — Telephone Encounter (Signed)
Currently hospitalized.

## 2022-01-31 NOTE — Assessment & Plan Note (Signed)
-   Replete as needed 

## 2022-01-31 NOTE — Consult Note (Addendum)
Consultation Note   Referring Provider:  [facility] PCP: [name], MD Primary Gastroenterologist: Doristine Locks, MD Reason for consultation: GI bleed  Hospital Day: 2  Assessment    Patient profile:  Samuel Blevins is a 56 y.o. male with a past medical history significant for   Etoh abuse, duodenal AVMs, gastritis, iron deficiency anemia, H.pylori infection, pancreatitis, hepatic steatosis, HTN, homelessness  . See PMH for any additional medical problems. Admitted 12/6 with reported bloody emesis and blood in stool on Tuesday. Dark, heme + stool was found on rectal exam. Additionally, he fell on Wed and struck his face and was having bleeding from his nose and mouth in ED.   # Reported episode of bloody emesis and red blood in stool two days ago.  He has had 2-3 prior admissions for hematemesis with EGD findings of gastritis, duodenitis and duodenal AVMs.  His hgb is stable at baseline. CTA negative for active bleeding. Seems this was a low volume GI bleed which has resolved. Blood emesis may have been secondary to esophagitis, erosive gastritis, PUD, less like Mallory- Weiss tear. The episode of blood in stool was likely unrelated to hematemesis and may have been hemorrhoidal. However, he has never been able to get recommended colonoscopy due to homelessness.  # Etoh abuse. Etoh level 360 in ED  # Recent fall. CT C-spine showing possible subtle nondisplaced fracture through the superior articular facet at C7 on the right.   # Chronic N[place] anemia. Hgb is 11.1 ( at baseline). He actually has pancytopenia. Bone marrow suppression 2/2 Etoh? No reported cirrhosis on CTA. Oral iron on home med list but he doesn't know if he is taking it . Shelter gives him his medications.   # History pancreatitis in 2021. No evidence for chronic pancreatitis on CTA this admission but he has Creon on home med list ( he doesn't know if the shelter is  giving it to him)  # See PMH for additional medical problems  Plan  Continue BID IV PPI He wants to eat. Can have clear liquids today. No plans for EGD today ( mainly due to there being available time in Endoscopy) He does eventually need a colonoscopy. Hasn't been able to get it due to homelessness -----------------------------------------------------------------------------------------------------------------------------------------------------  GI attending: I have seen and evaluated the patient in person and agree with the NP note.  Additional comments:  56 year old intoxicated man with alcoholism who fell and had facial trauma and has had some upper GI bleeding this admission which may have resolved.  Endoscopic workup is reasonable but not at this time until he gets through period of potential alcohol withdrawal.  Iva Boop, MD, [facility] See Loretha Stapler on call - gastroenterology for best contact person 01/31/2022 5:53 PM  HPI   Patient brought to ED by EMS yesterday for bloody emesis, blood in stool and also for evaluation after a fall. He is a limited historian, doesn't offer much information. He doesn't know what medications he takes. Sounds like he has not been taking PPI. He reports sudden onset of vomiting with blood x1 on Tuesday. Later in day had red blood in stool. No BMs or rectal bleeding since. Also no further N/V.   Significant studies:  K+ 2.8 >> 3.3 after repletion BUN 5 AST 120, ALT 31 INR 1.0 WBC 2.5, hgb 11.1, MCV 93 Platelets 124 Ammonia 36 Lipase 42   Previous GI Evaluation     April 2021 EGD -Gastritis. Biopsied. 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 Treated with argon plasma coagulation (APC). - Mucosal changes in the duodenum. Biopsied.   Oct 2022 EGD for ? GI bleed -Mild gastritis - Single duodenal erosion without bleeding. No UGI bleeding - The examination was otherwise normal.  JUne 2023 EGD for hematemesis -Normal  esophagus. - 3 cm hiatal hernia. - Normal stomach. Biopsied. - A single possible duodenal polyp. Biopsied. - No upper GI bleeding source identified.  A. DUODENUM, POLYPECTOMY:  Reactive duodenal mucosa with extensive gastric metaplasia compatible  with peptic duodenitis   B. STOMACH, BIOPSY:  Reactive gastropathy focal activity and mild chronic gastritis  Focal intestinal metaplasia  Helicobacter stain negative ([facility], adequate control)  Negative for dysplasia and carcinoma      Recent Labs and Imaging CT Head Wo Contrast  Result Date: 01/30/2022 CLINICAL DATA:  Trauma EXAM: CT HEAD WITHOUT CONTRAST CT MAXILLOFACIAL WITHOUT CONTRAST CT CERVICAL SPINE WITHOUT CONTRAST TECHNIQUE: Multidetector CT imaging of the head, cervical spine, and maxillofacial structures were performed using the standard protocol without intravenous contrast. Multiplanar CT image reconstructions of the cervical spine and maxillofacial structures were also generated. RADIATION DOSE REDUCTION: This exam was performed according to the departmental dose-optimization program which includes automated exposure control, adjustment of the mA and/or kV according to patient size and/or use of iterative reconstruction technique. COMPARISON:  CT [place] 08/26/20, MRI brain 08/26/20 FINDINGS: CT HEAD FINDINGS Brain: No evidence of acute infarction, hemorrhage, hydrocephalus, extra-axial collection or mass lesion/mass effect. Vascular: No hyperdense vessel or unexpected calcification. Skull: Normal. Negative for fracture or focal lesion. Other: Soft tissue injury at the vertex. CT MAXILLOFACIAL FINDINGS Osseous: No fracture or mandibular dislocation. No destructive process. Orbits: Negative. No traumatic or inflammatory finding. Sinuses: Mucosal thickening left maxillary sinus Soft tissues: Negative. CT CERVICAL SPINE FINDINGS Alignment: There is trace anterolisthesis of C6 on C7. Mild retrolisthesis of C3 on C4 and C4 on C5. Skull base and  vertebrae: There is a subtle nondisplaced fracture through the superior articular facet at C7 on the right. Soft tissues and spinal canal: No prevertebral fluid or swelling. No visible canal hematoma. Disc levels: Mild spinal canal narrowing at C2-C3 and C4-C5. Upper chest: Negative. Other: None IMPRESSION: 1. No CT evidence of intracranial injury. 2. Possible subtle nondisplaced fracture through the superior articular facet at C7 on the right. 3. No evidence of facial bone fracture. Electronically Signed   By: Lorenza CambridgeHemant  Desai M.D.   On: 01/30/2022 19:04   CT Cervical Spine Wo Contrast  Result Date: 01/30/2022 CLINICAL DATA:  Trauma EXAM: CT HEAD WITHOUT CONTRAST CT MAXILLOFACIAL WITHOUT CONTRAST CT CERVICAL SPINE WITHOUT CONTRAST TECHNIQUE: Multidetector CT imaging of the head, cervical spine, and maxillofacial structures were performed using the standard protocol without intravenous contrast. Multiplanar CT image reconstructions of the cervical spine and maxillofacial structures were also generated. RADIATION DOSE REDUCTION: This exam was performed according to the departmental dose-optimization program which includes automated exposure control, adjustment of the mA and/or kV according to patient size and/or use of iterative reconstruction technique. COMPARISON:  CT [place] 08/26/20, MRI brain 08/26/20 FINDINGS: CT HEAD FINDINGS Brain: No evidence of acute infarction, hemorrhage, hydrocephalus, extra-axial collection or mass lesion/mass effect. Vascular: No hyperdense vessel or unexpected calcification. Skull: Normal. Negative for fracture or focal  lesion. Other: Soft tissue injury at the vertex. CT MAXILLOFACIAL FINDINGS Osseous: No fracture or mandibular dislocation. No destructive process. Orbits: Negative. No traumatic or inflammatory finding. Sinuses: Mucosal thickening left maxillary sinus Soft tissues: Negative. CT CERVICAL SPINE FINDINGS Alignment: There is trace anterolisthesis of C6 on C7. Mild  retrolisthesis of C3 on C4 and C4 on C5. Skull base and vertebrae: There is a subtle nondisplaced fracture through the superior articular facet at C7 on the right. Soft tissues and spinal canal: No prevertebral fluid or swelling. No visible canal hematoma. Disc levels: Mild spinal canal narrowing at C2-C3 and C4-C5. Upper chest: Negative. Other: None IMPRESSION: 1. No CT evidence of intracranial injury. 2. Possible subtle nondisplaced fracture through the superior articular facet at C7 on the right. 3. No evidence of facial bone fracture. Electronically Signed   By: Lorenza Cambridge M.D.   On: 01/30/2022 19:04   CT Maxillofacial Wo Contrast  Result Date: 01/30/2022 CLINICAL DATA:  Trauma EXAM: CT HEAD WITHOUT CONTRAST CT MAXILLOFACIAL WITHOUT CONTRAST CT CERVICAL SPINE WITHOUT CONTRAST TECHNIQUE: Multidetector CT imaging of the head, cervical spine, and maxillofacial structures were performed using the standard protocol without intravenous contrast. Multiplanar CT image reconstructions of the cervical spine and maxillofacial structures were also generated. RADIATION DOSE REDUCTION: This exam was performed according to the departmental dose-optimization program which includes automated exposure control, adjustment of the mA and/or kV according to patient size and/or use of iterative reconstruction technique. COMPARISON:  CT [place] 08/26/20, MRI brain 08/26/20 FINDINGS: CT HEAD FINDINGS Brain: No evidence of acute infarction, hemorrhage, hydrocephalus, extra-axial collection or mass lesion/mass effect. Vascular: No hyperdense vessel or unexpected calcification. Skull: Normal. Negative for fracture or focal lesion. Other: Soft tissue injury at the vertex. CT MAXILLOFACIAL FINDINGS Osseous: No fracture or mandibular dislocation. No destructive process. Orbits: Negative. No traumatic or inflammatory finding. Sinuses: Mucosal thickening left maxillary sinus Soft tissues: Negative. CT CERVICAL SPINE FINDINGS Alignment: There  is trace anterolisthesis of C6 on C7. Mild retrolisthesis of C3 on C4 and C4 on C5. Skull base and vertebrae: There is a subtle nondisplaced fracture through the superior articular facet at C7 on the right. Soft tissues and spinal canal: No prevertebral fluid or swelling. No visible canal hematoma. Disc levels: Mild spinal canal narrowing at C2-C3 and C4-C5. Upper chest: Negative. Other: None IMPRESSION: 1. No CT evidence of intracranial injury. 2. Possible subtle nondisplaced fracture through the superior articular facet at C7 on the right. 3. No evidence of facial bone fracture. Electronically Signed   By: Lorenza Cambridge M.D.   On: 01/30/2022 19:04   CT ANGIO ABDOMEN PELVIS  W &/OR WO CONTRAST  Result Date: 01/30/2022 CLINICAL DATA:  Alcohol abuse, vomiting, rectal bleeding EXAM: CTA ABDOMEN AND PELVIS WITHOUT AND WITH CONTRAST TECHNIQUE: Multidetector CT imaging of the abdomen and pelvis was performed using the standard protocol during bolus administration of intravenous contrast. Multiplanar reconstructed images and MIPs were obtained and reviewed to evaluate the vascular anatomy. RADIATION DOSE REDUCTION: This exam was performed according to the departmental dose-optimization program which includes automated exposure control, adjustment of the mA and/or kV according to patient size and/or use of iterative reconstruction technique. CONTRAST:  OMNIPAQUE IOHEXOL 350 MG/ML SOLN COMPARISON:  12/25/2021 FINDINGS: VASCULAR Aorta: Normal caliber aorta without aneurysm, dissection, vasculitis or significant stenosis. Mild atherosclerosis. Celiac: Patent without evidence of aneurysm, dissection, vasculitis or significant stenosis. SMA: Patent without evidence of aneurysm, dissection, vasculitis or significant stenosis. Mild atherosclerosis. Renals: Both renal arteries are patent  without evidence of aneurysm, dissection, vasculitis, fibromuscular dysplasia or significant stenosis. Mild atherosclerosis. IMA:  Patent without evidence of aneurysm, dissection, vasculitis or significant stenosis. Inflow: Patent without evidence of aneurysm, dissection, vasculitis or significant stenosis. Diffuse atherosclerosis without focal stenosis. Proximal Outflow: Bilateral common femoral and visualized portions of the superficial and profunda femoral arteries are patent without evidence of aneurysm, dissection, vasculitis or significant stenosis. Mild atherosclerosis. Veins: No obvious venous abnormality within the limitations of this arterial phase study. Review of the MIP images confirms the above findings. NON-VASCULAR Lower chest: No acute pleural or parenchymal lung disease. Hepatobiliary: Hepatic steatosis. No biliary duct dilation. Gallbladder is unremarkable. Pancreas: Unremarkable. No pancreatic ductal dilatation or surrounding inflammatory changes. Spleen: Normal in size without focal abnormality. Adrenals/Urinary Tract: Adrenal glands are unremarkable. Kidneys are normal, without renal calculi, focal lesion, or hydronephrosis. Bladder is unremarkable. Stomach/Bowel: No bowel obstruction or ileus. No bowel wall thickening or inflammatory change. No intraluminal accumulation of contrast to suggest active gastrointestinal bleeding. Lymphatic: No pathologic adenopathy within the abdomen or pelvis. Reproductive: Nodular enlargement of the median lobe of the prostate unchanged. Other: No free fluid or free intraperitoneal gas. Prior umbilical hernia repair. There is a small fat containing supraumbilical midline ventral hernia again noted. No bowel herniation. Musculoskeletal: No acute or destructive bony lesions. Reconstructed images demonstrate no additional findings. IMPRESSION: VASCULAR 1. No evidence of active gastrointestinal bleeding. 2.  Aortic Atherosclerosis (ICD10-I70.0). NON-VASCULAR 1. Hepatic steatosis. 2. No acute intra-abdominal or intrapelvic process. 3. Stable nodular enlargement of the prostate. 4. Stable midline  supraumbilical fat containing ventral hernia. Electronically Signed   By: Sharlet Salina M.D.   On: 01/30/2022 19:02   DG Chest 2 View  Result Date: 01/30/2022 CLINICAL DATA:  Altered mental status. EXAM: CHEST - 2 VIEW COMPARISON:  December 06, 2021. FINDINGS: The heart size and mediastinal contours are within normal limits. Both lungs are clear. The visualized skeletal structures are unremarkable. IMPRESSION: No active cardiopulmonary disease. Electronically Signed   By: Lupita Raider M.D.   On: 01/30/2022 18:17    Labs:  Recent Labs    01/30/22 1704 01/30/22 2353 01/31/22 0302  WBC 3.8* 2.6* 2.5*  HGB 12.4* 11.4* 11.1*  HCT 38.8* 34.5* 34.1*  PLT 147* 133* 124*   Recent Labs    01/30/22 1704 01/31/22 0302  NA 139 141  K 2.8* 3.3*  CL 103 105  CO2 22 24  GLUCOSE 87 79  BUN 6 5*  CREATININE 0.56* 0.57*  CALCIUM 8.8* 8.3*   Recent Labs    01/31/22 0302  PROT 7.7  ALBUMIN 3.3*  AST 120*  ALT 31  ALKPHOS 87  BILITOT 0.9    Recent Labs    01/30/22 1704  LABPROT 13.0  INR 1.0    Past Medical History:  Diagnosis Date   Alcohol withdrawal syndrome with complication (HCC)    Alcoholism (HCC)    Elevated AST (SGOT)    Gastrointestinal hemorrhage    Homeless    Hypertension    Pancreatic insufficiency    takes Creon   Symptomatic anemia 11/23/2015   Thrombocytopenia (HCC) 05/09/2021    Past Surgical History:  Procedure Laterality Date   BIOPSY  06/23/2019   Procedure: BIOPSY;  Surgeon: Shellia Cleverly, DO;  Location: [facility] ENDOSCOPY;  Service: Gastroenterology;;   BIOPSY  12/12/2020   Procedure: BIOPSY;  Surgeon: Lynann Bologna, MD;  Location: [facility] ENDOSCOPY;  Service: Endoscopy;;   BIOPSY  08/04/2021   Procedure: BIOPSY;  Surgeon: Tomasa Rand,  Dub Amis, MD;  Location: [facility] ENDOSCOPY;  Service: Gastroenterology;;   ESOPHAGOGASTRODUODENOSCOPY N/A 08/04/2021   Procedure: ESOPHAGOGASTRODUODENOSCOPY (EGD);  Surgeon: Jenel Lucks, MD;  Location: [facility] ENDOSCOPY;   Service: Gastroenterology;  Laterality: N/A;   ESOPHAGOGASTRODUODENOSCOPY (EGD) WITH PROPOFOL N/A 06/23/2019   Procedure: ESOPHAGOGASTRODUODENOSCOPY (EGD) WITH PROPOFOL;  Surgeon: Shellia Cleverly, DO;  Location: [facility] ENDOSCOPY;  Service: Gastroenterology;  Laterality: N/A;   ESOPHAGOGASTRODUODENOSCOPY (EGD) WITH PROPOFOL N/A 12/12/2020   Procedure: ESOPHAGOGASTRODUODENOSCOPY (EGD) WITH PROPOFOL;  Surgeon: Lynann Bologna, MD;  Location: [facility] ENDOSCOPY;  Service: Endoscopy;  Laterality: N/A;   HOT HEMOSTASIS N/A 06/23/2019   Procedure: HOT HEMOSTASIS (ARGON PLASMA COAGULATION/BICAP);  Surgeon: Shellia Cleverly, DO;  Location: [facility] ENDOSCOPY;  Service: Gastroenterology;  Laterality: N/A;    Family History  Problem Relation Age of Onset   Diabetes Mellitus II Neg Hx    Colon cancer Neg Hx    Stomach cancer Neg Hx    Pancreatic cancer Neg Hx     Prior to Admission medications   Medication Sig Start Date End Date Taking? Authorizing Provider  acetaminophen (TYLENOL) 325 MG tablet Take 2 tablets (650 mg total) by mouth every 6 (six) hours as needed for fever, moderate pain, mild pain or headache. Patient not taking: Reported on 05/06/2021 12/12/20   Belva Agee, MD  amLODipine (NORVASC) 5 MG tablet Take 1 tablet (5 mg total) by mouth daily. 12/27/21   [name], MD  folic acid (FOLVITE) 1 MG tablet Take 1 tablet (1 mg total) by mouth daily. 10/17/21   [name], MD  gabapentin (NEURONTIN) 100 MG capsule Take 1 capsule (100 mg total) by mouth 3 (three) times daily. 01/23/22   [name], MD  Iron, Ferrous Sulfate, 325 (65 Fe) MG TABS Take 1 tablet ( 325 mg) by mouth daily. 10/17/21   [name], MD  lipase/protease/amylase (CREON) 12000-38000 units CPEP capsule Take 1 capsule (12,000 Units total) by mouth 3 (three) times daily before meals. 12/27/21   [name], MD  LORazepam (ATIVAN) 1 MG tablet Take 3 tablets (3 mg total) by mouth daily for 5 days, THEN 2  tablets (2 mg total) daily for 5 days, THEN 1 tablet (1 mg total) daily thereafter 01/23/22 02/18/22  [name], MD  Multiple Vitamin (MULTIVITAMIN WITH MINERALS) TABS tablet Take 1 tablet by mouth daily. 09/27/21   [name], MD  ondansetron (ZOFRAN) 8 MG tablet Take 1 tablet (8 mg total) by mouth every 6 (six) hours as needed for nausea. 01/23/22   [name], MD  pantoprazole (PROTONIX) 40 MG tablet Take 1 tablet (40 mg total) by mouth 2 (two) times daily before a meal. 12/27/21   [name], MD  polyethylene glycol (MIRALAX / GLYCOLAX) 17 g packet Take 17 g by mouth daily as needed for mild constipation or moderate constipation. 10/07/21   Rocky Morel, DO  senna-docusate (SENOKOT-S) 8.6-50 MG tablet Take 1 tablet by mouth at bedtime as needed for mild constipation. 10/07/21   Rocky Morel, DO  sucralfate (CARAFATE) 1 g tablet Take 1 tablet (1 g total) by mouth 4 (four) times daily -  with meals and at bedtime. 12/27/21   [name], MD  thiamine (VITAMIN B1) 100 MG tablet Take 1 tablet (100 mg total) by mouth daily. 10/02/21   [name], MD    Current Facility-Administered Medications  Medication Dose Route Frequency Provider Last Rate Last Admin   Chlorhexidine Gluconate Cloth  2 % PADS 6 each  6 each Topical Daily John Giovanni, MD   6 each at 01/31/22 0100   folic acid (FOLVITE) tablet 1 mg  1 mg Oral Daily John Giovanni, MD   1 mg at 01/30/22 2150   LORazepam (ATIVAN) tablet 1-4 mg  1-4 mg Oral Q1H PRN John Giovanni, MD       Or   LORazepam (ATIVAN) injection 1-4 mg  1-4 mg Intravenous Q1H PRN John Giovanni, MD   3 mg at 01/31/22 0914   multivitamin with minerals tablet 1 tablet  1 tablet Oral Daily John Giovanni, MD   1 tablet at 01/30/22 2149   Oral care mouth rinse  15 mL Mouth Rinse 4 times per day John Giovanni, MD   15 mL at 01/31/22 0902   Oral care mouth rinse  15 mL Mouth Rinse PRN John Giovanni, MD        pantoprazole (PROTONIX) injection 40 mg  40 mg Intravenous Q12H John Giovanni, MD   40 mg at 01/31/22 0545   thiamine (VITAMIN B1) tablet 100 mg  100 mg Oral Daily John Giovanni, MD   100 mg at 01/30/22 2150   Or   thiamine (VITAMIN B1) injection 100 mg  100 mg Intravenous Daily John Giovanni, MD        Allergies as of 01/30/2022   (No Known Allergies)    Social History   Socioeconomic History   Marital status: Single    Spouse name: Not on file   Number of children: Not on file   Years of education: Not on file   Highest education level: Not on file  Occupational History   Not on file  Tobacco Use   Smoking status: Every Day    Packs/day: 1.00    Years: 30.00    Total pack years: 30.00    Types: Cigarettes   Smokeless tobacco: Never  Vaping Use   Vaping Use: Never used  Substance and Sexual Activity   Alcohol use: Yes   Drug use: Never   Sexual activity: Not Currently  Other Topics Concern   Not on file  Social History Narrative   Not on file   Social Determinants of Health   Financial Resource Strain: Not on file  Food Insecurity: Not on file  Transportation Needs: Not on file  Physical Activity: Not on file  Stress: Not on file  Social Connections: Not on file  Intimate Partner Violence: Not on file    Review of Systems: All systems reviewed and negative except where noted in HPI.  Physical Exam: Vital signs in last 24 hours: Temp:  [98.1 F (36.7 C)-98.5 F (36.9 C)] 98.1 F (36.7 C) (12/07 0800) Pulse Rate:  [81-106] 88 (12/07 0900) Resp:  [11-20] 14 (12/07 0400) BP: (118-150)/(70-104) 148/79 (12/07 0900) SpO2:  [90 %-99 %] 98 % (12/07 0400) Weight:  [79.8 kg] 79.8 kg (12/07 0107) Last BM Date : 01/29/22  General:  Alert male in NAD Psych:  cooperative.  Eyes: Pupils equal Ears:  Normal auditory acuity Nose: No deformity, discharge or lesions Neck:  Supple, no masses felt Lungs:  Clear to auscultation.  Heart:  Regular  rate, regular rhythm.  Abdomen:  Soft, nondistended, nontender, active bowel sounds, no masses felt Rectal :  Deferred Msk: Symmetrical without gross deformities.  Neurologic:  Alert, oriented, grossly normal neurologically Extremities : No edema Skin:  Intact without significant lesions.    Intake/Output from previous day: 12/06 0701 - 12/07 0700  In: 826.4 [I.V.:515.3; IV Piggyback:311.2] Out: 300 [Urine:300] Intake/Output this shift:  No intake/output data recorded.    Principal Problem:   GI bleed Active Problems:   Alcohol dependence syndrome (HCC)   Transaminitis   Essential hypertension   Thrombocytopenia (HCC)   Hypokalemia   Cervical spine fracture (HCC)    Willette Cluster, NP-C @  01/31/2022, 9:27 AM

## 2022-01-31 NOTE — Assessment & Plan Note (Signed)
Resume amlodipine. 

## 2022-02-01 DIAGNOSIS — F10931 Alcohol use, unspecified with withdrawal delirium: Secondary | ICD-10-CM

## 2022-02-01 DIAGNOSIS — S12601A Unspecified nondisplaced fracture of seventh cervical vertebra, initial encounter for closed fracture: Secondary | ICD-10-CM | POA: Diagnosis not present

## 2022-02-01 DIAGNOSIS — K922 Gastrointestinal hemorrhage, unspecified: Secondary | ICD-10-CM | POA: Diagnosis not present

## 2022-02-01 DIAGNOSIS — F1020 Alcohol dependence, uncomplicated: Secondary | ICD-10-CM

## 2022-02-01 LAB — COMPREHENSIVE METABOLIC PANEL
ALT: 30 U/L (ref 0–44)
AST: 86 U/L — ABNORMAL HIGH (ref 15–41)
Albumin: 3.6 g/dL (ref 3.5–5.0)
Alkaline Phosphatase: 98 U/L (ref 38–126)
Anion gap: 13 (ref 5–15)
BUN: 5 mg/dL — ABNORMAL LOW (ref 6–20)
CO2: 24 mmol/L (ref 22–32)
Calcium: 8.9 mg/dL (ref 8.9–10.3)
Chloride: 95 mmol/L — ABNORMAL LOW (ref 98–111)
Creatinine, Ser: 0.56 mg/dL — ABNORMAL LOW (ref 0.61–1.24)
GFR, Estimated: >60 mL/min (ref >60)
Glucose, Bld: 100 mg/dL — ABNORMAL HIGH (ref 70–99)
Potassium: 3.1 mmol/L — ABNORMAL LOW (ref 3.5–5.1)
Sodium: 132 mmol/L — ABNORMAL LOW (ref 135–145)
Total Bilirubin: 2.2 mg/dL — ABNORMAL HIGH (ref 0.3–1.2)
Total Protein: 8.3 g/dL — ABNORMAL HIGH (ref 6.5–8.1)

## 2022-02-01 LAB — CBC WITH DIFFERENTIAL/PLATELET
Abs Immature Granulocytes: 0.01 10*3/uL (ref 0.00–0.07)
Basophils Absolute: 0 10*3/uL (ref 0.0–0.1)
Basophils Relative: 1 %
Eosinophils Absolute: 0 10*3/uL (ref 0.0–0.5)
Eosinophils Relative: 1 %
HCT: 37.3 % — ABNORMAL LOW (ref 39.0–52.0)
Hemoglobin: 12.4 g/dL — ABNORMAL LOW (ref 13.0–17.0)
Immature Granulocytes: 0 %
Lymphocytes Relative: 18 %
Lymphs Abs: 0.7 10*3/uL (ref 0.7–4.0)
MCH: 30.5 pg (ref 26.0–34.0)
MCHC: 33.2 g/dL (ref 30.0–36.0)
MCV: 91.9 fL (ref 80.0–100.0)
Monocytes Absolute: 0.4 10*3/uL (ref 0.1–1.0)
Monocytes Relative: 11 %
Neutro Abs: 2.6 10*3/uL (ref 1.7–7.7)
Neutrophils Relative %: 69 %
Platelets: 124 10*3/uL — ABNORMAL LOW (ref 150–400)
RBC: 4.06 MIL/uL — ABNORMAL LOW (ref 4.22–5.81)
RDW: 15.8 % — ABNORMAL HIGH (ref 11.5–15.5)
WBC: 3.7 10*3/uL — ABNORMAL LOW (ref 4.0–10.5)
nRBC: 0 % (ref 0.0–0.2)

## 2022-02-01 LAB — MAGNESIUM: Magnesium: 1.6 mg/dL — ABNORMAL LOW (ref 1.7–2.4)

## 2022-02-01 MED ORDER — DEXMEDETOMIDINE HCL IN NACL 400 MCG/100ML IV SOLN
0.4000 ug/kg/h | INTRAVENOUS | Status: DC
  Administered 2022-02-01: 0.9 ug/kg/h via INTRAVENOUS
  Filled 2022-02-01: qty 100

## 2022-02-01 MED ORDER — PHENOBARBITAL SODIUM 65 MG/ML IJ SOLN: 32.5000 mg | Freq: Three times a day (TID) | INTRAMUSCULAR | Status: DC

## 2022-02-01 MED ORDER — SODIUM CHLORIDE 0.9 % IV SOLN: 0.4000 ug/kg/h | INTRAVENOUS | Status: DC

## 2022-02-01 MED ORDER — PHENOBARBITAL SODIUM 130 MG/ML IJ SOLN: 97.5000 mg | Freq: Three times a day (TID) | INTRAMUSCULAR | Status: DC

## 2022-02-01 MED ORDER — POTASSIUM CHLORIDE 10 MEQ/100ML IV SOLN
10.0000 meq | INTRAVENOUS | Status: AC
  Administered 2022-02-01 (×4): 10 meq via INTRAVENOUS
  Filled 2022-02-01 (×4): qty 100

## 2022-02-01 MED ORDER — PHENOBARBITAL SODIUM 65 MG/ML IJ SOLN: 65.0000 mg | Freq: Three times a day (TID) | INTRAMUSCULAR | Status: DC

## 2022-02-01 MED ORDER — SODIUM CHLORIDE 0.9 % IV SOLN
260.0000 mg | Freq: Once | INTRAVENOUS | Status: AC
  Administered 2022-02-01: 260 mg via INTRAVENOUS
  Filled 2022-02-01: qty 2

## 2022-02-01 MED ORDER — MAGNESIUM SULFATE 2 GM/50ML IV SOLN
2.0000 g | Freq: Once | INTRAVENOUS | Status: AC
  Administered 2022-02-01: 2 g via INTRAVENOUS
  Filled 2022-02-01: qty 50

## 2022-02-01 NOTE — Discharge Instructions (Addendum)
Make appointment with [facility] for followup [address] [place] [address]  [phone]

## 2022-02-01 NOTE — Consult Note (Addendum)
NAME:  Samuel Blevins, MRN:  [id number], DOB:  [birth year], LOS: 1 ADMISSION DATE:  01/30/2022, CONSULTATION DATE:  02/01/22 REFERRING MD:  Frederick Peers, CHIEF COMPLAINT:  agitation   History of Present Illness:  56yM with history of alcohol use disorder and severe withdrawal, chronic pancreatitis, pancreatic pseudocyst, chroinc pancreatic insufficiency, anemia/TCP, unhoused who presented to ED after fall found to be intoxicated/in early withdrawal. He also reported bloody emesis and dark stools PTA which have not been seen during admission - GI following. He was started on CIWA based ativan and then precedex started last evening which he remains on this morning, still requiring intermittent ativan.  Had C7 superior articular facet fracture and remains in C collar till NSGY outpatient follow up  Pertinent  Medical History  Alcohol use diosrder Severe etoh withdrawal Chroinc pancreatitis Pancreatic pseudocyst Chronic pancreatic insufficiency  Significant Hospital Events: Including procedures, antibiotic start and stop dates in addition to other pertinent events   12/6 admitted started on CIWA based ativan  Interim History / Subjective:    Objective   Blood pressure (!) 168/86, pulse 82, temperature 99 F (37.2 C), temperature source Axillary, resp. rate 10, height 5\' 9"  (1.753 m), weight 79.8 kg, SpO2 100 %.        Intake/Output Summary (Last 24 hours) at 02/01/2022 1201 Last data filed at 02/01/2022 14/09/2021 Gross per 24 hour  Intake 449.07 ml  Output 1300 ml  Net -850.93 ml   Filed Weights   01/31/22 0107  Weight: 79.8 kg    Examination: General appearance: 56 y.o., male, fidgety Eyes: PERRL, sclerae injected HENT: NCAT; MMM Neck: c collar in place Lungs: CTAB, no crackles, no wheeze, with normal respiratory effort CV: RRR, no murmur  Abdomen: Soft, non-tender; non-distended, BS present  Extremities: No peripheral edema, warm Skin: Normal turgor and texture; no rash Neuro:  follows commands, grossly nonfocal   Resolved Hospital Problem list     Assessment & Plan:   Acute toxic encephalopathy Moderate-severe alcohol withdrawal - start phenobarb taper - ciwa based ativan - wean precedex as able - thiamine  Hematemesis, melenic stools - ?resolved Pancytopenia Unclear if due to etoh  - trend cbc - GI following - ppi bid  Transaminitis - trend  Cervical fracture - c collar till nsgy follow up  Best Practice (right click and "Reselect all SmartList Selections" daily)   Per [facility]  Labs   CBC: Recent Labs  Lab 01/30/22 1704 01/30/22 2353 01/31/22 0302 02/01/22 0317  WBC 3.8* 2.6* 2.5* 3.7*  NEUTROABS 2.3  --   --  2.6  HGB 12.4* 11.4* 11.1* 12.4*  HCT 38.8* 34.5* 34.1* 37.3*  MCV 95.1 93.2 93.9 91.9  PLT 147* 133* 124* 124*    Basic Metabolic Panel: Recent Labs  Lab 01/30/22 1704 01/30/22 2353 01/31/22 0302 02/01/22 0317  NA 139  --  141 132*  K 2.8*  --  3.3* 3.1*  CL 103  --  105 95*  CO2 22  --  24 24  GLUCOSE 87  --  79 100*  BUN 6  --  5* 5*  CREATININE 0.56*  --  0.57* 0.56*  CALCIUM 8.8*  --  8.3* 8.9  MG  --  1.4*  --  1.6*   GFR: Estimated Creatinine Clearance: 101.9 mL/min (A) (by C-G formula based on SCr of 0.56 mg/dL (L)). Recent Labs  Lab 01/30/22 1704 01/30/22 2353 01/31/22 0302 02/01/22 0317  WBC 3.8* 2.6* 2.5* 3.7*    Liver Function Tests:  Recent Labs  Lab 01/30/22 1704 01/31/22 0302 02/01/22 0317  AST 114* 120* 86*  ALT 32 31 30  ALKPHOS 98 87 98  BILITOT 1.0 0.9 2.2*  PROT 8.5* 7.7 8.3*  ALBUMIN 3.7 3.3* 3.6   Recent Labs  Lab 01/30/22 1704  LIPASE 54*   Recent Labs  Lab 01/30/22 1704  AMMONIA 36*    ABG    Component Value Date/Time   TCO2 23 08/26/2020 1141     Coagulation Profile: Recent Labs  Lab 01/30/22 1704  INR 1.0    Cardiac Enzymes: No results for input(s): "CKTOTAL", "CKMB", "CKMBINDEX", "TROPONINI" in the last 168 hours.  HbA1C: Hgb A1c MFr Bld   Date/Time Value Ref Range Status  08/02/2021 12:47 PM 5.2 4.8 - 5.6 % Final    Comment:             Prediabetes: 5.7 - 6.4          Diabetes: >6.4          Glycemic control for adults with diabetes: <7.0     CBG: Recent Labs  Lab 01/30/22 1656  GLUCAP 88    Review of Systems:   12 point review of systems is negative except as in HPI  Past Medical History:  He,  has a past medical history of Alcohol withdrawal syndrome with complication (HCC), Alcoholism (HCC), Elevated AST (SGOT), Gastrointestinal hemorrhage, Homeless, Hypertension, Pancreatic insufficiency, Symptomatic anemia (11/23/2015), and Thrombocytopenia (HCC) (05/09/2021).   Surgical History:   Past Surgical History:  Procedure Laterality Date   BIOPSY  06/23/2019   Procedure: BIOPSY;  Surgeon: Shellia Cleverly, DO;  Location: [facility] ENDOSCOPY;  Service: Gastroenterology;;   BIOPSY  12/12/2020   Procedure: BIOPSY;  Surgeon: Lynann Bologna, MD;  Location: [facility] ENDOSCOPY;  Service: Endoscopy;;   BIOPSY  08/04/2021   Procedure: BIOPSY;  Surgeon: Jenel Lucks, MD;  Location: [facility] ENDOSCOPY;  Service: Gastroenterology;;   ESOPHAGOGASTRODUODENOSCOPY N/A 08/04/2021   Procedure: ESOPHAGOGASTRODUODENOSCOPY (EGD);  Surgeon: Jenel Lucks, MD;  Location: [facility] ENDOSCOPY;  Service: Gastroenterology;  Laterality: N/A;   ESOPHAGOGASTRODUODENOSCOPY (EGD) WITH PROPOFOL N/A 06/23/2019   Procedure: ESOPHAGOGASTRODUODENOSCOPY (EGD) WITH PROPOFOL;  Surgeon: Shellia Cleverly, DO;  Location: [facility] ENDOSCOPY;  Service: Gastroenterology;  Laterality: N/A;   ESOPHAGOGASTRODUODENOSCOPY (EGD) WITH PROPOFOL N/A 12/12/2020   Procedure: ESOPHAGOGASTRODUODENOSCOPY (EGD) WITH PROPOFOL;  Surgeon: Lynann Bologna, MD;  Location: [facility] ENDOSCOPY;  Service: Endoscopy;  Laterality: N/A;   HOT HEMOSTASIS N/A 06/23/2019   Procedure: HOT HEMOSTASIS (ARGON PLASMA COAGULATION/BICAP);  Surgeon: Shellia Cleverly, DO;  Location: [facility] ENDOSCOPY;  Service:  Gastroenterology;  Laterality: N/A;     Social History:   reports that he has been smoking. He has a 30.00 pack-year smoking history. He has never used smokeless tobacco. He reports current alcohol use. He reports that he does not use drugs.   Family History:  His family history is negative for Diabetes Mellitus II, Colon cancer, Stomach cancer, and Pancreatic cancer.   Allergies No Known Allergies   Home Medications  Prior to Admission medications   Medication Sig Start Date End Date Taking? Authorizing Provider  acetaminophen (TYLENOL) 325 MG tablet Take 2 tablets (650 mg total) by mouth every 6 (six) hours as needed for fever, moderate pain, mild pain or headache. 12/12/20  Yes Katsadouros, Vasilios, MD  amLODipine (NORVASC) 5 MG tablet Take 1 tablet (5 mg total) by mouth daily. 12/27/21  Yes [name], MD  gabapentin (NEURONTIN) 100 MG capsule Take 1 capsule (100  mg total) by mouth 3 (three) times daily. 01/23/22  Yes [name], MD  lipase/protease/amylase (CREON) 12000-38000 units CPEP capsule Take 1 capsule (12,000 Units total) by mouth 3 (three) times daily before meals. 12/27/21  Yes [name], MD  LORazepam (ATIVAN) 1 MG tablet Take 3 tablets (3 mg total) by mouth daily for 5 days, THEN 2 tablets (2 mg total) daily for 5 days, THEN 1 tablet (1 mg total) daily thereafter 01/23/22 02/18/22 Yes [name], MD  ondansetron (ZOFRAN) 8 MG tablet Take 1 tablet (8 mg total) by mouth every 6 (six) hours as needed for nausea. 01/23/22  Yes [name], MD  pantoprazole (PROTONIX) 40 MG tablet Take 1 tablet (40 mg total) by mouth 2 (two) times daily before a meal. 12/27/21  Yes [name], MD  polyethylene glycol (MIRALAX / GLYCOLAX) 17 g packet Take 17 g by mouth daily as needed for mild constipation or moderate constipation. 10/07/21  Yes Rocky Morel, DO  senna-docusate (SENOKOT-S) 8.6-50 MG tablet Take 1 tablet by mouth at bedtime as needed for mild  constipation. 10/07/21  Yes Rocky Morel, DO  sucralfate (CARAFATE) 1 g tablet Take 1 tablet (1 g total) by mouth 4 (four) times daily -  with meals and at bedtime. 12/27/21  Yes [name], MD  folic acid (FOLVITE) 1 MG tablet Take 1 tablet (1 mg total) by mouth daily. Patient not taking: Reported on 01/31/2022 10/17/21   [name], MD  Iron, Ferrous Sulfate, 325 (65 Fe) MG TABS Take 1 tablet ( 325 mg) by mouth daily. Patient not taking: Reported on 01/31/2022 10/17/21   [name], MD  Multiple Vitamin (MULTIVITAMIN WITH MINERALS) TABS tablet Take 1 tablet by mouth daily. Patient not taking: Reported on 01/31/2022 09/27/21   [name], MD  thiamine (VITAMIN B1) 100 MG tablet Take 1 tablet (100 mg total) by mouth daily. Patient not taking: Reported on 01/31/2022 10/02/21   [name], MD     Critical care time: 35 minutes

## 2022-02-01 NOTE — Progress Notes (Signed)
Daily Progress Note  Hospital Day: 3  Chief Complaint: GI bleed  Assessment   Patient profile:  Samuel Blevins is a 56 y.o. male with a pmh of Etoh abuse, duodenal AVMs, gastritis, iron deficiency anemia, H.pylori infection, pancreatitis, hepatic steatosis, HTN, homelessness. See PMH for any additional medical problems. Admitted 12/6 with reported bloody emesis and blood in stool dark, heme + stool was found on rectal exam. Additionally, he fell and struck his face and was having bleeding from his nose and mouth in ED.    # Reported episode of bloody emesis and red blood in stool prior to admission.   He has had 2-3 prior admissions for hematemesis with EGD findings of gastritis, duodenitis and duodenal AVMs.  His hgb is stable (at baseline) at 12.4. CTA negative for active bleeding. Seems this was a low volume GI bleed which has resolved. Bloody emesis may have been secondary to esophagitis, erosive gastritis, PUD, less like Mallory-Weiss tear. The episode of blood in stool was likely unrelated to hematemesis and may have been hemorrhoidal. However, he has never been able to get recommended colonoscopy due to homelessness. Spoke with RN. No hematemesis or BMs / rectal bleeding.   # Chronic Gilbert anemia. He actually has pancytopenia. Bone marrow suppression 2/2 Etoh? No reported cirrhosis on CTA. Oral iron on home med list but he doesn't know if he is taking it .    # Etoh abuse. Etoh level 360 in ED. Being treated for withdrawals with agitation  # Abnormal liver chemistries. AST to ALT ration c/w Etoh. Tbili 2.2  # Hypokalemia, K+ 3.1   # Recent fall. CT C-spine showing possible subtle nondisplaced fracture through the superior articular facet at C7 on the right.    # History pancreatitis in 2021. No evidence for chronic pancreatitis on CTA this admission but he has Creon on home med list ( he doesn't know if the shelter is giving it to him)  Plan:    Probable endoscopic evaluation  once over Etoh withdrawals Continue BID IV PPI   Objective  Imaging:  CT Head Wo Contrast  Result Date: 01/30/2022 CLINICAL DATA:  Trauma EXAM: CT HEAD WITHOUT CONTRAST CT MAXILLOFACIAL WITHOUT CONTRAST CT CERVICAL SPINE WITHOUT CONTRAST TECHNIQUE: Multidetector CT imaging of the head, cervical spine, and maxillofacial structures were performed using the standard protocol without intravenous contrast. Multiplanar CT image reconstructions of the cervical spine and maxillofacial structures were also generated. RADIATION DOSE REDUCTION: This exam was performed according to the departmental dose-optimization program which includes automated exposure control, adjustment of the mA and/or kV according to patient size and/or use of iterative reconstruction technique. COMPARISON:  CT [place] 08/26/20, MRI brain 08/26/20 FINDINGS: CT HEAD FINDINGS Brain: No evidence of acute infarction, hemorrhage, hydrocephalus, extra-axial collection or mass lesion/mass effect. Vascular: No hyperdense vessel or unexpected calcification. Skull: Normal. Negative for fracture or focal lesion. Other: Soft tissue injury at the vertex. CT MAXILLOFACIAL FINDINGS Osseous: No fracture or mandibular dislocation. No destructive process. Orbits: Negative. No traumatic or inflammatory finding. Sinuses: Mucosal thickening left maxillary sinus Soft tissues: Negative. CT CERVICAL SPINE FINDINGS Alignment: There is trace anterolisthesis of C6 on C7. Mild retrolisthesis of C3 on C4 and C4 on C5. Skull base and vertebrae: There is a subtle nondisplaced fracture through the superior articular facet at C7 on the right. Soft tissues and spinal canal: No prevertebral fluid or swelling. No visible canal hematoma. Disc levels: Mild spinal canal narrowing at C2-C3 and C4-C5. Upper chest:  Negative. Other: None IMPRESSION: 1. No CT evidence of intracranial injury. 2. Possible subtle nondisplaced fracture through the superior articular facet at C7 on the right. 3.  No evidence of facial bone fracture. Electronically Signed   By: Marin Roberts M.D.   On: 01/30/2022 19:04   CT Cervical Spine Wo Contrast  Result Date: 01/30/2022 CLINICAL DATA:  Trauma EXAM: CT HEAD WITHOUT CONTRAST CT MAXILLOFACIAL WITHOUT CONTRAST CT CERVICAL SPINE WITHOUT CONTRAST TECHNIQUE: Multidetector CT imaging of the head, cervical spine, and maxillofacial structures were performed using the standard protocol without intravenous contrast. Multiplanar CT image reconstructions of the cervical spine and maxillofacial structures were also generated. RADIATION DOSE REDUCTION: This exam was performed according to the departmental dose-optimization program which includes automated exposure control, adjustment of the mA and/or kV according to patient size and/or use of iterative reconstruction technique. COMPARISON:  CT [place] 08/26/20, MRI brain 08/26/20 FINDINGS: CT HEAD FINDINGS Brain: No evidence of acute infarction, hemorrhage, hydrocephalus, extra-axial collection or mass lesion/mass effect. Vascular: No hyperdense vessel or unexpected calcification. Skull: Normal. Negative for fracture or focal lesion. Other: Soft tissue injury at the vertex. CT MAXILLOFACIAL FINDINGS Osseous: No fracture or mandibular dislocation. No destructive process. Orbits: Negative. No traumatic or inflammatory finding. Sinuses: Mucosal thickening left maxillary sinus Soft tissues: Negative. CT CERVICAL SPINE FINDINGS Alignment: There is trace anterolisthesis of C6 on C7. Mild retrolisthesis of C3 on C4 and C4 on C5. Skull base and vertebrae: There is a subtle nondisplaced fracture through the superior articular facet at C7 on the right. Soft tissues and spinal canal: No prevertebral fluid or swelling. No visible canal hematoma. Disc levels: Mild spinal canal narrowing at C2-C3 and C4-C5. Upper chest: Negative. Other: None IMPRESSION: 1. No CT evidence of intracranial injury. 2. Possible subtle nondisplaced fracture through the  superior articular facet at C7 on the right. 3. No evidence of facial bone fracture. Electronically Signed   By: Marin Roberts M.D.   On: 01/30/2022 19:04   CT Maxillofacial Wo Contrast  Result Date: 01/30/2022 CLINICAL DATA:  Trauma EXAM: CT HEAD WITHOUT CONTRAST CT MAXILLOFACIAL WITHOUT CONTRAST CT CERVICAL SPINE WITHOUT CONTRAST TECHNIQUE: Multidetector CT imaging of the head, cervical spine, and maxillofacial structures were performed using the standard protocol without intravenous contrast. Multiplanar CT image reconstructions of the cervical spine and maxillofacial structures were also generated. RADIATION DOSE REDUCTION: This exam was performed according to the departmental dose-optimization program which includes automated exposure control, adjustment of the mA and/or kV according to patient size and/or use of iterative reconstruction technique. COMPARISON:  CT [place] 08/26/20, MRI brain 08/26/20 FINDINGS: CT HEAD FINDINGS Brain: No evidence of acute infarction, hemorrhage, hydrocephalus, extra-axial collection or mass lesion/mass effect. Vascular: No hyperdense vessel or unexpected calcification. Skull: Normal. Negative for fracture or focal lesion. Other: Soft tissue injury at the vertex. CT MAXILLOFACIAL FINDINGS Osseous: No fracture or mandibular dislocation. No destructive process. Orbits: Negative. No traumatic or inflammatory finding. Sinuses: Mucosal thickening left maxillary sinus Soft tissues: Negative. CT CERVICAL SPINE FINDINGS Alignment: There is trace anterolisthesis of C6 on C7. Mild retrolisthesis of C3 on C4 and C4 on C5. Skull base and vertebrae: There is a subtle nondisplaced fracture through the superior articular facet at C7 on the right. Soft tissues and spinal canal: No prevertebral fluid or swelling. No visible canal hematoma. Disc levels: Mild spinal canal narrowing at C2-C3 and C4-C5. Upper chest: Negative. Other: None IMPRESSION: 1. No CT evidence of intracranial injury. 2.  Possible subtle nondisplaced fracture through the  superior articular facet at C7 on the right. 3. No evidence of facial bone fracture. Electronically Signed   By: Marin Roberts M.D.   On: 01/30/2022 19:04   CT ANGIO ABDOMEN PELVIS  W &/OR WO CONTRAST  Result Date: 01/30/2022 CLINICAL DATA:  Alcohol abuse, vomiting, rectal bleeding EXAM: CTA ABDOMEN AND PELVIS WITHOUT AND WITH CONTRAST TECHNIQUE: Multidetector CT imaging of the abdomen and pelvis was performed using the standard protocol during bolus administration of intravenous contrast. Multiplanar reconstructed images and MIPs were obtained and reviewed to evaluate the vascular anatomy. RADIATION DOSE REDUCTION: This exam was performed according to the departmental dose-optimization program which includes automated exposure control, adjustment of the mA and/or kV according to patient size and/or use of iterative reconstruction technique. CONTRAST:  111mL OMNIPAQUE IOHEXOL 350 MG/ML SOLN COMPARISON:  12/25/2021 FINDINGS: VASCULAR Aorta: Normal caliber aorta without aneurysm, dissection, vasculitis or significant stenosis. Mild atherosclerosis. Celiac: Patent without evidence of aneurysm, dissection, vasculitis or significant stenosis. SMA: Patent without evidence of aneurysm, dissection, vasculitis or significant stenosis. Mild atherosclerosis. Renals: Both renal arteries are patent without evidence of aneurysm, dissection, vasculitis, fibromuscular dysplasia or significant stenosis. Mild atherosclerosis. IMA: Patent without evidence of aneurysm, dissection, vasculitis or significant stenosis. Inflow: Patent without evidence of aneurysm, dissection, vasculitis or significant stenosis. Diffuse atherosclerosis without focal stenosis. Proximal Outflow: Bilateral common femoral and visualized portions of the superficial and profunda femoral arteries are patent without evidence of aneurysm, dissection, vasculitis or significant stenosis. Mild atherosclerosis.  Veins: No obvious venous abnormality within the limitations of this arterial phase study. Review of the MIP images confirms the above findings. NON-VASCULAR Lower chest: No acute pleural or parenchymal lung disease. Hepatobiliary: Hepatic steatosis. No biliary duct dilation. Gallbladder is unremarkable. Pancreas: Unremarkable. No pancreatic ductal dilatation or surrounding inflammatory changes. Spleen: Normal in size without focal abnormality. Adrenals/Urinary Tract: Adrenal glands are unremarkable. Kidneys are normal, without renal calculi, focal lesion, or hydronephrosis. Bladder is unremarkable. Stomach/Bowel: No bowel obstruction or ileus. No bowel wall thickening or inflammatory change. No intraluminal accumulation of contrast to suggest active gastrointestinal bleeding. Lymphatic: No pathologic adenopathy within the abdomen or pelvis. Reproductive: Nodular enlargement of the median lobe of the prostate unchanged. Other: No free fluid or free intraperitoneal gas. Prior umbilical hernia repair. There is a small fat containing supraumbilical midline ventral hernia again noted. No bowel herniation. Musculoskeletal: No acute or destructive bony lesions. Reconstructed images demonstrate no additional findings. IMPRESSION: VASCULAR 1. No evidence of active gastrointestinal bleeding. 2.  Aortic Atherosclerosis (ICD10-I70.0). NON-VASCULAR 1. Hepatic steatosis. 2. No acute intra-abdominal or intrapelvic process. 3. Stable nodular enlargement of the prostate. 4. Stable midline supraumbilical fat containing ventral hernia. Electronically Signed   By: Randa Ngo M.D.   On: 01/30/2022 19:02   DG Chest 2 View  Result Date: 01/30/2022 CLINICAL DATA:  Altered mental status. EXAM: CHEST - 2 VIEW COMPARISON:  December 06, 2021. FINDINGS: The heart size and mediastinal contours are within normal limits. Both lungs are clear. The visualized skeletal structures are unremarkable. IMPRESSION: No active cardiopulmonary disease.  Electronically Signed   By: Marijo Conception M.D.   On: 01/30/2022 18:17    Lab Results: Recent Labs    01/30/22 2353 01/31/22 0302 02/01/22 0317  WBC 2.6* 2.5* 3.7*  HGB 11.4* 11.1* 12.4*  HCT 34.5* 34.1* 37.3*  PLT 133* 124* 124*   BMET Recent Labs    01/30/22 1704 01/31/22 0302 02/01/22 0317  NA 139 141 132*  K 2.8* 3.3* 3.1*  CL  103 105 95*  CO2 22 24 24   GLUCOSE 87 79 100*  BUN 6 5* 5*  CREATININE 0.56* 0.57* 0.56*  CALCIUM 8.8* 8.3* 8.9   LFT Recent Labs    02/01/22 0317  PROT 8.3*  ALBUMIN 3.6  AST 86*  ALT 30  ALKPHOS 98  BILITOT 2.2*   PT/INR Recent Labs    01/30/22 1704  LABPROT 13.0  INR 1.0     Scheduled inpatient medications:   amLODipine  5 mg Oral Daily   Chlorhexidine Gluconate Cloth  6 each Topical Daily   folic acid  1 mg Oral Daily   multivitamin with minerals  1 tablet Oral Daily   mouth rinse  15 mL Mouth Rinse 4 times per day   pantoprazole (PROTONIX) IV  40 mg Intravenous Q12H   PHENObarbital  97.5 mg Intravenous Q8H   Followed by   Derrill Memo ON 02/03/2022] PHENObarbital  65 mg Intravenous Q8H   Followed by   Derrill Memo ON 02/05/2022] PHENObarbital  32.5 mg Intravenous Q8H   thiamine  100 mg Oral Daily   Or   thiamine  100 mg Intravenous Daily   Continuous inpatient infusions:   dexmedetomidine 0.9 mcg/kg/hr (02/01/22 1005)   PHENObarbital     potassium chloride Stopped (02/01/22 1106)   PRN inpatient medications: haloperidol lactate, hydrALAZINE, labetalol, LORazepam **OR** LORazepam, mouth rinse  Vital signs in last 24 hours: Temp:  [98.9 F (37.2 C)-99.4 F (37.4 C)] 99 F (37.2 C) (12/08 0300) Pulse Rate:  [81-101] 82 (12/08 0849) Resp:  [10-24] 10 (12/08 0600) BP: (134-196)/(71-114) 168/86 (12/08 0849) SpO2:  [93 %-100 %] 100 % (12/08 0600) Last BM Date : 01/29/22  Intake/Output Summary (Last 24 hours) at 02/01/2022 1059 Last data filed at 02/01/2022 0646 Gross per 24 hour  Intake 449.07 ml  Output 1300 ml  Net  -850.93 ml    Intake/Output from previous day: 12/07 0701 - 12/08 0700 In: 449.1 [P.O.:150; I.V.:207.3; IV Piggyback:91.8] Out: 1300 [Urine:1300] Intake/Output this shift: No intake/output data recorded.   Physical Exam:  General: Awake, calm Heart:  Regular rate and rhythm.  Pulmonary: Normal respiratory effort Abdomen: Soft, nondistended. Normal bowel sounds. Extremities: No lower extremity edema    Principal Problem:   GI bleed Active Problems:   Alcohol dependence syndrome (HCC)   Transaminitis   Essential hypertension   Thrombocytopenia (HCC)   Hypokalemia   Cervical spine fracture (HCC)   GIB (gastrointestinal bleeding)   Pancytopenia ([place])     LOS: 1 day   Tye Savoy ,NP 02/01/2022, 10:59 AM

## 2022-02-01 NOTE — Discharge Summary (Signed)
Physician Discharge Summary   Red Mandt ZOX:[id number] DOB: [birth year] DOA: 01/30/2022  PCP: [name], MD  Admit date: 01/30/2022 Discharge date: 02/01/2022 LEFT AMA   Admitted From: home Disposition:  home Discharging physician: Lewie Chamber, MD  Recommendations for Outpatient Follow-up:  Patient instructed to follow-up with neurosurgery regarding c-collar  Diet recommendation:  Diet Orders (From admission, onward)     Start     Ordered   02/01/22 1406  Diet full liquid Room service appropriate? Yes; Fluid consistency: Thin  Diet effective now       Question Answer Comment  Room service appropriate? Yes   Fluid consistency: Thin      02/01/22 1405            Hospital Course: Mr. Longenecker is a 56 yo male with PMH alcohol misuse disorder, chronic pancreatitis, pancreatic pseudocyst, chronic pancreatic insufficiency, hypertension, chronic anemia and thrombocytopenia, homelessness who presented to the ED after a fall.  He had also reported an episode of bloody emesis and dark stools prior to admission.  He has had prior EGDs during similar evaluations for hematemesis. He does continue to consume alcohol and resides at [facility]. Hemoglobin is stable and at baseline on admission.  GI was consulted for further evaluation.  GI bleed workup was placed on hold in setting of his ensuing alcohol withdrawal. He required escalation up to Precedex drip along with phenobarb taper. Despite use with Ativan and Haldol as well, he continued to have ongoing withdrawal symptoms. He was deemed to have capacity and was insistent on leaving AMA multiple times. Risks and benefits were discussed prior to patient leaving and he still insisted on leaving.  He was given contact information for [facility] for following up regarding his suspected C7 fracture.  Cervical collar in place at time of discharge. Despite trying to encourage patient to remain in the hospital, he  still chose to leave AMA.  Assessment and Plan: * GI bleed - Prior history noted similar with hematemesis.  He also describes dark stools and has not been able to have outpt colonoscopy due to homelessness - continues to consume alcohol - last EGD June 2023 noted with peptic duodenitis and reactive gastropathy with mild chronic gastritis -Currently hemoglobin stable.  Continue trending; no further episodes since admission - GI following, appreciate assistance.  Tentative plan for more what sounds like inpatient EGD.  Patient may also benefit from colonoscopy if having difficulty obtaining outpatient; decision deferred to GI  Cervical spine fracture (HCC) - s/p mechanical fall also with contribution from alcohol intoxication -CT C-spine shows "possible subtle nondisplaced fracture through the superior articular facet of C7 on the right" -Neurosurgery recommending c-collar and outpatient follow-up  Alcohol dependence syndrome (HCC) - Ethanol level 360 on admission -Patient endorsed that his last drink was Tuesday evening - Continue CIWA protocol -Continue thiamine, folate, multivitamin  Pancytopenia (HCC) - Again, suspected due to bone marrow suppression from chronic alcohol use  Hypokalemia - Replete as needed  Thrombocytopenia (HCC) - Likely due to bone marrow suppression from chronic alcohol use - Continue trending  Essential hypertension - Resume amlodipine  Transaminitis - Due to chronic alcohol use - Continue trending    Principal Diagnosis: GI bleed  Discharge Diagnoses: Active Hospital Problems   Diagnosis Date Noted   GI bleed 01/30/2022    Priority: 1.   Cervical spine fracture (HCC) 01/31/2022    Priority: 2.   Alcohol dependence syndrome (HCC) 06/21/2019    Priority: 4.  GIB (gastrointestinal bleeding) 01/31/2022   Pancytopenia (HCC) 01/31/2022   Hypokalemia    Thrombocytopenia (HCC) 05/09/2021   Transaminitis    Essential hypertension 03/17/2014     Resolved Hospital Problems  No resolved problems to display.      Allergies as of 02/01/2022   No Known Allergies      Medication List     STOP taking these medications    LORazepam 1 MG tablet Commonly known as: ATIVAN       TAKE these medications    acetaminophen 325 MG tablet Commonly known as: TYLENOL Take 2 tablets (650 mg total) by mouth every 6 (six) hours as needed for fever, moderate pain, mild pain or headache.   amLODipine 5 MG tablet Commonly known as: NORVASC Take 1 tablet (5 mg total) by mouth daily.   Creon 12000-38000 units Cpep capsule Generic drug: lipase/protease/amylase Take 1 capsule (12,000 Units total) by mouth 3 (three) times daily before meals.   folic acid 1 MG tablet Commonly known as: FOLVITE Take 1 tablet (1 mg total) by mouth daily.   gabapentin 100 MG capsule Commonly known as: NEURONTIN Take 1 capsule (100 mg total) by mouth 3 (three) times daily.   Iron (Ferrous Sulfate) 325 (65 Fe) MG Tabs Take 1 tablet ( 325 mg) by mouth daily.   multivitamin with minerals Tabs tablet Take 1 tablet by mouth daily.   ondansetron 8 MG tablet Commonly known as: ZOFRAN Take 1 tablet (8 mg total) by mouth every 6 (six) hours as needed for nausea.   pantoprazole 40 MG tablet Commonly known as: PROTONIX Take 1 tablet (40 mg total) by mouth 2 (two) times daily before a meal.   polyethylene glycol 17 g packet Commonly known as: MIRALAX / GLYCOLAX Take 17 g by mouth daily as needed for mild constipation or moderate constipation.   senna-docusate 8.6-50 MG tablet Commonly known as: Senokot-S Take 1 tablet by mouth at bedtime as needed for mild constipation.   sucralfate 1 g tablet Commonly known as: Carafate Take 1 tablet (1 g total) by mouth 4 (four) times daily -  with meals and at bedtime.   thiamine 100 MG tablet Commonly known as: VITAMIN B1 Take 1 tablet (100 mg total) by mouth daily.        Follow-up Information     Pa,  [facility]. Schedule an appointment as soon as possible for a visit in 2 week(s).   Specialty: Neurosurgery Contact information: [address] [place] [address] [phone]                No Known Allergies  Consultations: PCCM GI  Procedures:   Discharge Exam: BP 123/70   Pulse 62   Temp 98.3 F (36.8 C) (Oral)   Resp 12   Ht  (1.753 m)   Wt 79.8 kg   SpO2 98%   BMI 25.98 kg/m  Physical Exam Constitutional:      General: He is not in acute distress.    Appearance: Normal appearance.  HENT:     Head: Normocephalic and atraumatic.     Mouth/Throat:     Mouth: Mucous membranes are moist.  Eyes:     Extraocular Movements: Extraocular movements intact.  Neck:     Comments: C collar in place Cardiovascular:     Rate and Rhythm: Normal rate and regular rhythm.     Heart sounds: Normal heart sounds.  Pulmonary:  Effort: Pulmonary effort is normal. No respiratory distress.     Breath sounds: Normal breath sounds. No wheezing.  Abdominal:     General: Bowel sounds are normal. There is no distension.     Palpations: Abdomen is soft.     Comments: Mild nonspecific tenderness throughout; no rebound/guarding  Musculoskeletal:        General: Normal range of motion.     Cervical back: Normal range of motion and neck supple.  Skin:    General: Skin is warm and dry.  Neurological:     Mental Status: He is alert.     Comments: Mild resting tremor appreciated in hands bilaterally  Psychiatric:        Mood and Affect: Mood normal.        Behavior: Behavior normal.      The results of significant diagnostics from this hospitalization (including imaging, microbiology, ancillary and laboratory) are listed below for reference.   Microbiology: Recent Results (from the past 240 hour(s))  MRSA Next Gen by PCR, Nasal     Status: None   Collection Time: 01/31/22  1:04 AM   Specimen: Nasal Mucosa; Nasal Swab   Result Value Ref Range Status   MRSA by PCR Next Gen NOT DETECTED NOT DETECTED Final    Comment: (NOTE) The GeneXpert MRSA Assay (FDA approved for NASAL specimens only), is one component of a comprehensive MRSA colonization surveillance program. It is not intended to diagnose MRSA infection nor to guide or monitor treatment for MRSA infections. Test performance is not FDA approved in patients less than 77 years old. Performed at [facility], [address] [place], [place] [address]      Labs: BNP (last 3 results) No results for input(s): "BNP" in the last 8760 hours. Basic Metabolic Panel: Recent Labs  Lab 01/30/22 1704 01/30/22 2353 01/31/22 0302 02/01/22 0317  NA 139  --  141 132*  K 2.8*  --  3.3* 3.1*  CL 103  --  105 95*  CO2 22  --  24 24  GLUCOSE 87  --  79 100*  BUN 6  --  5* 5*  CREATININE 0.56*  --  0.57* 0.56*  CALCIUM 8.8*  --  8.3* 8.9  MG  --  1.4*  --  1.6*   Liver Function Tests: Recent Labs  Lab 01/30/22 1704 01/31/22 0302 02/01/22 0317  AST 114* 120* 86*  ALT 32 31 30  ALKPHOS 98 87 98  BILITOT 1.0 0.9 2.2*  PROT 8.5* 7.7 8.3*  ALBUMIN 3.7 3.3* 3.6   Recent Labs  Lab 01/30/22 1704  LIPASE 54*   Recent Labs  Lab 01/30/22 1704  AMMONIA 36*   CBC: Recent Labs  Lab 01/30/22 1704 01/30/22 2353 01/31/22 0302 02/01/22 0317  WBC 3.8* 2.6* 2.5* 3.7*  NEUTROABS 2.3  --   --  2.6  HGB 12.4* 11.4* 11.1* 12.4*  HCT 38.8* 34.5* 34.1* 37.3*  MCV 95.1 93.2 93.9 91.9  PLT 147* 133* 124* 124*   Cardiac Enzymes: No results for input(s): "CKTOTAL", "CKMB", "CKMBINDEX", "TROPONINI" in the last 168 hours. BNP: Invalid input(s): "POCBNP" CBG: Recent Labs  Lab 01/30/22 1656  GLUCAP 88   D-Dimer No results for input(s): "DDIMER" in the last 72 hours. Hgb A1c No results for input(s): "HGBA1C" in the last 72 hours. Lipid Profile No results for input(s): "CHOL", "HDL", "LDLCALC", "TRIG", "CHOLHDL", "LDLDIRECT" in the  last 72 hours. Thyroid function studies No results for input(s): "TSH", "  T4TOTAL", "T3FREE", "THYROIDAB" in the last 72 hours.  Invalid input(s): "FREET3" Anemia work up No results for input(s): "VITAMINB12", "FOLATE", "FERRITIN", "TIBC", "IRON", "RETICCTPCT" in the last 72 hours. Urinalysis    Component Value Date/Time   COLORURINE YELLOW 12/17/2021 1957   APPEARANCEUR CLEAR 12/17/2021 1957   LABSPEC 1.009 12/17/2021 1957   PHURINE 6.0 12/17/2021 1957   GLUCOSEU NEGATIVE 12/17/2021 1957   HGBUR NEGATIVE 12/17/2021 1957   BILIRUBINUR NEGATIVE 12/17/2021 1957   KETONESUR NEGATIVE 12/17/2021 1957   PROTEINUR 30 (A) 12/17/2021 1957   NITRITE POSITIVE (A) 12/17/2021 1957   LEUKOCYTESUR NEGATIVE 12/17/2021 1957   Sepsis Labs Recent Labs  Lab 01/30/22 1704 01/30/22 2353 01/31/22 0302 02/01/22 0317  WBC 3.8* 2.6* 2.5* 3.7*   Microbiology Recent Results (from the past 240 hour(s))  MRSA Next Gen by PCR, Nasal     Status: None   Collection Time: 01/31/22  1:04 AM   Specimen: Nasal Mucosa; Nasal Swab  Result Value Ref Range Status   MRSA by PCR Next Gen NOT DETECTED NOT DETECTED Final    Comment: (NOTE) The GeneXpert MRSA Assay (FDA approved for NASAL specimens only), is one component of a comprehensive MRSA colonization surveillance program. It is not intended to diagnose MRSA infection nor to guide or monitor treatment for MRSA infections. Test performance is not FDA approved in patients less than 73 years old. Performed at [facility], [address], [place], [place] [address]     Procedures/Studies: CT Head Wo Contrast  Result Date: 01/30/2022 CLINICAL DATA:  Trauma EXAM: CT HEAD WITHOUT CONTRAST CT MAXILLOFACIAL WITHOUT CONTRAST CT CERVICAL SPINE WITHOUT CONTRAST TECHNIQUE: Multidetector CT imaging of the head, cervical spine, and maxillofacial structures were performed using the standard protocol without intravenous contrast. Multiplanar CT image  reconstructions of the cervical spine and maxillofacial structures were also generated. RADIATION DOSE REDUCTION: This exam was performed according to the departmental dose-optimization program which includes automated exposure control, adjustment of the mA and/or kV according to patient size and/or use of iterative reconstruction technique. COMPARISON:  CT [place] 08/26/20, MRI brain 08/26/20 FINDINGS: CT HEAD FINDINGS Brain: No evidence of acute infarction, hemorrhage, hydrocephalus, extra-axial collection or mass lesion/mass effect. Vascular: No hyperdense vessel or unexpected calcification. Skull: Normal. Negative for fracture or focal lesion. Other: Soft tissue injury at the vertex. CT MAXILLOFACIAL FINDINGS Osseous: No fracture or mandibular dislocation. No destructive process. Orbits: Negative. No traumatic or inflammatory finding. Sinuses: Mucosal thickening left maxillary sinus Soft tissues: Negative. CT CERVICAL SPINE FINDINGS Alignment: There is trace anterolisthesis of C6 on C7. Mild retrolisthesis of C3 on C4 and C4 on C5. Skull base and vertebrae: There is a subtle nondisplaced fracture through the superior articular facet at C7 on the right. Soft tissues and spinal canal: No prevertebral fluid or swelling. No visible canal hematoma. Disc levels: Mild spinal canal narrowing at C2-C3 and C4-C5. Upper chest: Negative. Other: None IMPRESSION: 1. No CT evidence of intracranial injury. 2. Possible subtle nondisplaced fracture through the superior articular facet at C7 on the right. 3. No evidence of facial bone fracture. Electronically Signed   By: Lorenza Cambridge M.D.   On: 01/30/2022 19:04   CT Cervical Spine Wo Contrast  Result Date: 01/30/2022 CLINICAL DATA:  Trauma EXAM: CT HEAD WITHOUT CONTRAST CT MAXILLOFACIAL WITHOUT CONTRAST CT CERVICAL SPINE WITHOUT CONTRAST TECHNIQUE: Multidetector CT imaging of the head, cervical spine, and maxillofacial structures were performed using the standard protocol without  intravenous contrast. Multiplanar CT image reconstructions of  the cervical spine and maxillofacial structures were also generated. RADIATION DOSE REDUCTION: This exam was performed according to the departmental dose-optimization program which includes automated exposure control, adjustment of the mA and/or kV according to patient size and/or use of iterative reconstruction technique. COMPARISON:  CT [place] 08/26/20, MRI brain 08/26/20 FINDINGS: CT HEAD FINDINGS Brain: No evidence of acute infarction, hemorrhage, hydrocephalus, extra-axial collection or mass lesion/mass effect. Vascular: No hyperdense vessel or unexpected calcification. Skull: Normal. Negative for fracture or focal lesion. Other: Soft tissue injury at the vertex. CT MAXILLOFACIAL FINDINGS Osseous: No fracture or mandibular dislocation. No destructive process. Orbits: Negative. No traumatic or inflammatory finding. Sinuses: Mucosal thickening left maxillary sinus Soft tissues: Negative. CT CERVICAL SPINE FINDINGS Alignment: There is trace anterolisthesis of C6 on C7. Mild retrolisthesis of C3 on C4 and C4 on C5. Skull base and vertebrae: There is a subtle nondisplaced fracture through the superior articular facet at C7 on the right. Soft tissues and spinal canal: No prevertebral fluid or swelling. No visible canal hematoma. Disc levels: Mild spinal canal narrowing at C2-C3 and C4-C5. Upper chest: Negative. Other: None IMPRESSION: 1. No CT evidence of intracranial injury. 2. Possible subtle nondisplaced fracture through the superior articular facet at C7 on the right. 3. No evidence of facial bone fracture. Electronically Signed   By: Lorenza CambridgeHemant  Desai M.D.   On: 01/30/2022 19:04   CT Maxillofacial Wo Contrast  Result Date: 01/30/2022 CLINICAL DATA:  Trauma EXAM: CT HEAD WITHOUT CONTRAST CT MAXILLOFACIAL WITHOUT CONTRAST CT CERVICAL SPINE WITHOUT CONTRAST TECHNIQUE: Multidetector CT imaging of the head, cervical spine, and maxillofacial structures were  performed using the standard protocol without intravenous contrast. Multiplanar CT image reconstructions of the cervical spine and maxillofacial structures were also generated. RADIATION DOSE REDUCTION: This exam was performed according to the departmental dose-optimization program which includes automated exposure control, adjustment of the mA and/or kV according to patient size and/or use of iterative reconstruction technique. COMPARISON:  CT [place] 08/26/20, MRI brain 08/26/20 FINDINGS: CT HEAD FINDINGS Brain: No evidence of acute infarction, hemorrhage, hydrocephalus, extra-axial collection or mass lesion/mass effect. Vascular: No hyperdense vessel or unexpected calcification. Skull: Normal. Negative for fracture or focal lesion. Other: Soft tissue injury at the vertex. CT MAXILLOFACIAL FINDINGS Osseous: No fracture or mandibular dislocation. No destructive process. Orbits: Negative. No traumatic or inflammatory finding. Sinuses: Mucosal thickening left maxillary sinus Soft tissues: Negative. CT CERVICAL SPINE FINDINGS Alignment: There is trace anterolisthesis of C6 on C7. Mild retrolisthesis of C3 on C4 and C4 on C5. Skull base and vertebrae: There is a subtle nondisplaced fracture through the superior articular facet at C7 on the right. Soft tissues and spinal canal: No prevertebral fluid or swelling. No visible canal hematoma. Disc levels: Mild spinal canal narrowing at C2-C3 and C4-C5. Upper chest: Negative. Other: None IMPRESSION: 1. No CT evidence of intracranial injury. 2. Possible subtle nondisplaced fracture through the superior articular facet at C7 on the right. 3. No evidence of facial bone fracture. Electronically Signed   By: Lorenza CambridgeHemant  Desai M.D.   On: 01/30/2022 19:04   CT ANGIO ABDOMEN PELVIS  W &/OR WO CONTRAST  Result Date: 01/30/2022 CLINICAL DATA:  Alcohol abuse, vomiting, rectal bleeding EXAM: CTA ABDOMEN AND PELVIS WITHOUT AND WITH CONTRAST TECHNIQUE: Multidetector CT imaging of the abdomen  and pelvis was performed using the standard protocol during bolus administration of intravenous contrast. Multiplanar reconstructed images and MIPs were obtained and reviewed to evaluate the vascular anatomy. RADIATION DOSE REDUCTION: This exam was performed  according to the departmental dose-optimization program which includes automated exposure control, adjustment of the mA and/or kV according to patient size and/or use of iterative reconstruction technique. CONTRAST:  OMNIPAQUE IOHEXOL 350 MG/ML SOLN COMPARISON:  12/25/2021 FINDINGS: VASCULAR Aorta: Normal caliber aorta without aneurysm, dissection, vasculitis or significant stenosis. Mild atherosclerosis. Celiac: Patent without evidence of aneurysm, dissection, vasculitis or significant stenosis. SMA: Patent without evidence of aneurysm, dissection, vasculitis or significant stenosis. Mild atherosclerosis. Renals: Both renal arteries are patent without evidence of aneurysm, dissection, vasculitis, fibromuscular dysplasia or significant stenosis. Mild atherosclerosis. IMA: Patent without evidence of aneurysm, dissection, vasculitis or significant stenosis. Inflow: Patent without evidence of aneurysm, dissection, vasculitis or significant stenosis. Diffuse atherosclerosis without focal stenosis. Proximal Outflow: Bilateral common femoral and visualized portions of the superficial and profunda femoral arteries are patent without evidence of aneurysm, dissection, vasculitis or significant stenosis. Mild atherosclerosis. Veins: No obvious venous abnormality within the limitations of this arterial phase study. Review of the MIP images confirms the above findings. NON-VASCULAR Lower chest: No acute pleural or parenchymal lung disease. Hepatobiliary: Hepatic steatosis. No biliary duct dilation. Gallbladder is unremarkable. Pancreas: Unremarkable. No pancreatic ductal dilatation or surrounding inflammatory changes. Spleen: Normal in size without focal abnormality.  Adrenals/Urinary Tract: Adrenal glands are unremarkable. Kidneys are normal, without renal calculi, focal lesion, or hydronephrosis. Bladder is unremarkable. Stomach/Bowel: No bowel obstruction or ileus. No bowel wall thickening or inflammatory change. No intraluminal accumulation of contrast to suggest active gastrointestinal bleeding. Lymphatic: No pathologic adenopathy within the abdomen or pelvis. Reproductive: Nodular enlargement of the median lobe of the prostate unchanged. Other: No free fluid or free intraperitoneal gas. Prior umbilical hernia repair. There is a small fat containing supraumbilical midline ventral hernia again noted. No bowel herniation. Musculoskeletal: No acute or destructive bony lesions. Reconstructed images demonstrate no additional findings. IMPRESSION: VASCULAR 1. No evidence of active gastrointestinal bleeding. 2.  Aortic Atherosclerosis (ICD10-I70.0). NON-VASCULAR 1. Hepatic steatosis. 2. No acute intra-abdominal or intrapelvic process. 3. Stable nodular enlargement of the prostate. 4. Stable midline supraumbilical fat containing ventral hernia. Electronically Signed   By: Sharlet Salina M.D.   On: 01/30/2022 19:02   DG Chest 2 View  Result Date: 01/30/2022 CLINICAL DATA:  Altered mental status. EXAM: CHEST - 2 VIEW COMPARISON:  December 06, 2021. FINDINGS: The heart size and mediastinal contours are within normal limits. Both lungs are clear. The visualized skeletal structures are unremarkable. IMPRESSION: No active cardiopulmonary disease. Electronically Signed   By: Lupita Raider M.D.   On: 01/30/2022 18:17     Time coordinating discharge: Over 30 minutes    Lewie Chamber, MD  [facility] 02/01/2022, 3:19 PM

## 2022-02-01 NOTE — Plan of Care (Signed)
  Problem: Pain Managment: Goal: General experience of comfort will improve Outcome: Progressing   Problem: Safety: Goal: Ability to remain free from injury will improve Outcome: Progressing   Problem: Education: Goal: Knowledge of General Education information will improve Description: Including pain rating scale, medication(s)/side effects and non-pharmacologic comfort measures Outcome: Not Progressing   Problem: Health Behavior/Discharge Planning: Goal: Ability to manage health-related needs will improve Outcome: Not Progressing   Problem: Clinical Measurements: Goal: Ability to maintain clinical measurements within normal limits will improve Outcome: Not Progressing Goal: Will remain free from infection Outcome: Not Progressing Goal: Diagnostic test results will improve Outcome: Not Progressing Goal: Respiratory complications will improve Outcome: Not Progressing Goal: Cardiovascular complication will be avoided Outcome: Not Progressing   Problem: Activity: Goal: Risk for activity intolerance will decrease Outcome: Not Progressing   Problem: Nutrition: Goal: Adequate nutrition will be maintained Outcome: Not Progressing   Problem: Coping: Goal: Level of anxiety will decrease Outcome: Not Progressing   Problem: Elimination: Goal: Will not experience complications related to bowel motility Outcome: Not Progressing Goal: Will not experience complications related to urinary retention Outcome: Not Progressing   Problem: Skin Integrity: Goal: Risk for impaired skin integrity will decrease Outcome: Not Progressing

## 2022-02-04 ENCOUNTER — Telehealth: Payer: Self-pay

## 2022-02-04 NOTE — Telephone Encounter (Signed)
Transition Care Management Unsuccessful Follow-up Telephone Call  Date of discharge and from where:  02/01/2022, [facility]- left AMA  Attempts:  1st Attempt  Reason for unsuccessful TCM follow-up call:  Left voice message [phone], call back requested.  Patient has an appointment at [facility] with Dr [name] - 02/12/2022

## 2022-02-05 ENCOUNTER — Telehealth: Payer: Self-pay

## 2022-02-05 NOTE — Telephone Encounter (Signed)
Transition Care Management Unsuccessful Follow-up Telephone Call  Date of discharge and from where:   02/01/2022, [facility]- left AMA    Attempts:  2nd Attempt  Reason for unsuccessful TCM follow-up call:  Unable to reach [phone], the recording stated that the call cannot be completed at this time.    Patient has an appointment at [facility] with Dr [name] - 02/12/2022

## 2022-02-06 ENCOUNTER — Telehealth: Payer: Self-pay

## 2022-02-06 ENCOUNTER — Other Ambulatory Visit: Payer: Self-pay | Admitting: Critical Care Medicine

## 2022-02-06 ENCOUNTER — Encounter: Payer: Self-pay | Admitting: Critical Care Medicine

## 2022-02-06 DIAGNOSIS — S12601A Unspecified nondisplaced fracture of seventh cervical vertebra, initial encounter for closed fracture: Secondary | ICD-10-CM

## 2022-02-06 NOTE — Telephone Encounter (Signed)
Transition Care Management Unsuccessful Follow-up Telephone Call  Date of discharge and from where:  02/01/2022, [facility]- left AMA    Attempts:  3rd Attempt  Reason for unsuccessful TCM follow-up call:  Unable to reach [phone], the recording stated that the call cannot be completed at this time.    Patient has an appointment at [facility] with Dr [name] - 02/12/2022

## 2022-02-06 NOTE — Congregational Nurse Program (Unsigned)
  Dept: [phone]   Congregational Nurse Program Note  Date of Encounter: 01/22/2022  Clinic visit for complaint of left eye drainage and decreased vision.  Referred to PCP for evaluation of eye issue, educated regarding preventing infection.    Past Medical History: Past Medical History:  Diagnosis Date   Alcohol withdrawal syndrome with complication (HCC)    Alcoholism ([address])    Elevated AST (SGOT)    Gastrointestinal hemorrhage    Homeless    Hypertension    Pancreatic insufficiency    takes Creon   Symptomatic anemia 11/23/2015   Thrombocytopenia ([place]) 05/09/2021    Encounter Details:         Dept: [phone]   Congregational Nurse Program Note  Date of Encounter: 01/22/2022  Clinic visit for continue difficulty with right eye draining.  [employer] case manager making arrangements with for further care, ophthalmology visit diagnosed cataract.  BP 162/75, to see MD at [facility] 01/23/2022. Triage form completed for MD review.   Past Medical History: Past Medical History:  Diagnosis Date   Alcohol withdrawal syndrome with complication (HCC)    Alcoholism ([place])    Elevated AST (SGOT)    Gastrointestinal hemorrhage    Homeless    Hypertension    Pancreatic insufficiency    takes Creon   Symptomatic anemia 11/23/2015   Thrombocytopenia ([place]) 05/09/2021    Encounter Details:

## 2022-02-07 NOTE — Progress Notes (Signed)
[facility]   Was in [place] and left AMA  I reconciled all of his medications and filled pill organizer  He is compliant with pill organizer  He has post hosp TOC appt with me on 12/19 will remind him  I placed referral to neurosurgery

## 2022-02-12 ENCOUNTER — Ambulatory Visit: Payer: Commercial Managed Care - HMO | Admitting: Critical Care Medicine

## 2022-02-12 ENCOUNTER — Telehealth: Payer: Self-pay | Admitting: Orthopedic Surgery

## 2022-02-12 NOTE — Telephone Encounter (Signed)
I have sent a message through [facility] Epic to Dr.Wright per referral patient is homeless, contact Dr.Wright.

## 2022-02-12 NOTE — Telephone Encounter (Signed)
-----   Message from Venetia Night, MD sent at 02/11/2022  6:17 PM EST ----- I think you can see him in 2-3 weeks with flex ext to clear him - I don't totally buy that fracture  ----- Message ----- From: Drake Leach, PA-C Sent: 02/11/2022   4:20 PM EST To: Venetia Night, MD  History of alcohol misuse disorder, chronic pancreatitis, pancreatic pseudocyst, chronic pancreatic insufficiency, hypertension, chronic anemia and thrombocytopenia, homelessness. Had a fall.   CT of cervical spine 01/30/22 showed possible subtle nondisplaced fracture through the superior articular facet at C7 on the right.  He was placed in collar. Left AMA. PCP referred him here.   See him in 3-4 weeks or does he need seen earlier?   Thanks!

## 2022-02-12 NOTE — Telephone Encounter (Signed)
Please see message from Dr. Myer Haff.

## 2022-02-14 ENCOUNTER — Encounter: Payer: Self-pay | Admitting: Critical Care Medicine

## 2022-02-14 ENCOUNTER — Ambulatory Visit: Payer: Commercial Managed Care - HMO | Attending: Critical Care Medicine | Admitting: Critical Care Medicine

## 2022-02-14 VITALS — BP 175/95 | HR 86 | Temp 99.1°F | Resp 16 | Ht 69.0 in | Wt 179.0 lb

## 2022-02-14 DIAGNOSIS — D696 Thrombocytopenia, unspecified: Secondary | ICD-10-CM

## 2022-02-14 DIAGNOSIS — K7010 Alcoholic hepatitis without ascites: Secondary | ICD-10-CM

## 2022-02-14 DIAGNOSIS — D61818 Other pancytopenia: Secondary | ICD-10-CM

## 2022-02-14 DIAGNOSIS — R7401 Elevation of levels of liver transaminase levels: Secondary | ICD-10-CM

## 2022-02-14 DIAGNOSIS — F1010 Alcohol abuse, uncomplicated: Secondary | ICD-10-CM

## 2022-02-14 DIAGNOSIS — K852 Alcohol induced acute pancreatitis without necrosis or infection: Secondary | ICD-10-CM | POA: Diagnosis not present

## 2022-02-14 DIAGNOSIS — I10 Essential (primary) hypertension: Secondary | ICD-10-CM

## 2022-02-14 DIAGNOSIS — E876 Hypokalemia: Secondary | ICD-10-CM

## 2022-02-14 DIAGNOSIS — D500 Iron deficiency anemia secondary to blood loss (chronic): Secondary | ICD-10-CM

## 2022-02-14 DIAGNOSIS — K2921 Alcoholic gastritis with bleeding: Secondary | ICD-10-CM | POA: Diagnosis not present

## 2022-02-14 DIAGNOSIS — S12601A Unspecified nondisplaced fracture of seventh cervical vertebra, initial encounter for closed fracture: Secondary | ICD-10-CM

## 2022-02-14 DIAGNOSIS — F1020 Alcohol dependence, uncomplicated: Secondary | ICD-10-CM

## 2022-02-14 DIAGNOSIS — K2920 Alcoholic gastritis without bleeding: Secondary | ICD-10-CM

## 2022-02-14 NOTE — Assessment & Plan Note (Signed)
Follow-up blood counts ?

## 2022-02-14 NOTE — Assessment & Plan Note (Signed)
Recent fall with C7 nondisplaced cervical fracture has follow-up with neurosurgery on January 3 for to testing imaging and treatments he still has a brace in place

## 2022-02-14 NOTE — Assessment & Plan Note (Signed)
Liver function abnormality in past due to image demonstrated alcohol steatohepatitis

## 2022-02-14 NOTE — Assessment & Plan Note (Signed)
Acute on chronic pancreatitis.  Tender at this time we will check lipase patient also gets Creon for pancreatic insufficiency

## 2022-02-14 NOTE — Assessment & Plan Note (Signed)
Patient actively drinking blood pressure not well-controlled in this circumstance also smoking 1/2 pack daily of cigarettes  For now continue current medication including amlodipine 5 mg daily which she takes irregularly

## 2022-02-14 NOTE — Progress Notes (Signed)
Established Patient Office Visit  Subjective   Patient ID: Samuel Blevins, male    DOB: [birth year]  Age: 56 y.o. MRN: [id number]  Nausea vomiting  08/02/21 This is a 56 year old male who has had previous history of pancreatic pseudocyst pancreatitis and severe alcoholism.  He has been drinking a pint of vodka daily.  He is at the [employer]  We brought him into the health and wellness office today to get labs and further examine him.  He had abdominal pain in the mid epigastric area that is worsening.  He has been vomiting coffee-ground material with occasional small amounts of blood.  He has had some dark melanotic stools.  The patient has been hospitalized in March of this year but never really had gastroenterology follow-up.  I tried to engage him at the shelter at least twice in the last 2 months he finally engaged with me and came to the office today for examination and emergency labs.  In the process of the stat labs being drawn we had his medications refilled that he had not been taking.  The labs came back positive for a lipase of greater than 1200 and have been in the low-grade 100 range.  Hemoglobin is about the same 7-1/2 platelet count is actually up his liver function actually is remarkably not any worse.  Because he has had a pancreatic pseudocyst I believe he needs CT imaging urgently and we cannot accomplish this as an outpatient particular given the fact he lives in a homeless shelter  We contacted the emergency room triage nurse at [facility], ER and told him we will be sending him over and actually he is going over to the emergency room from the homeless shelter.  He is stable at this time is not hemodynamically unstable and is not septic.  It would be best if he could have gastroenterology see him during this admission and perform an upper endoscopy as well.  Note the patient needs a rollator to walk with his alcoholism he has had significant alcoholic  neuropathy.    12/21 Patient seen for transition of care post hospital visit he had been seen previously at the homeless shelter for such same visit.  He has history of severe alcoholism recently fell had a partial fracture of C7 nondisplaced.  He has alcoholic pancreatitis and as well steatosis also has gastritis.  He has chronic abdominal pain he has bowel movements that are frequent with some blood in the stool and occasionally has emesis with blood in the stool.  Comes into the office very shaky has not had any drink today he has been shaking and jerking some.  He is not adherent with all his medications as listed.  Below is a copy of the discharge summary to which he left AMA. Physician Discharge Summary   [name] SHN:[id number] DOB: 30-Dec-1965 DOA: 01/30/2022   PCP: [name], MD   Admit date: 01/30/2022 Discharge date: 02/01/2022 LEFT AMA     Admitted From: home Disposition:  home Discharging physician: Dwyane Dee, MD   Recommendations for Outpatient Follow-up:  1. Patient instructed to follow-up with neurosurgery regarding c-collar      Hospital Course: [name] is a 56 yo male with PMH alcohol misuse disorder, chronic pancreatitis, pancreatic pseudocyst, chronic pancreatic insufficiency, hypertension, chronic anemia and thrombocytopenia, homelessness who presented to the ED after a fall.  He had also reported an episode of bloody emesis and dark stools prior to admission.  He has had prior EGDs during similar evaluations for hematemesis. He does continue to consume alcohol and resides at [facility]. Hemoglobin is stable and at baseline on admission.  GI was consulted for further evaluation.   GI bleed workup was placed on hold in setting of his ensuing alcohol withdrawal. He required escalation up to Precedex drip along with phenobarb taper. Despite use with Ativan and Haldol as well, he continued to have ongoing withdrawal symptoms. He was deemed to have  capacity and was insistent on leaving AMA multiple times. Risks and benefits were discussed prior to patient leaving and he still insisted on leaving.  He was given contact information for [place] neurosurgery for following up regarding his suspected C7 fracture.  Cervical collar in place at time of discharge. Despite trying to encourage patient to remain in the hospital, he still chose to leave AMA.   Assessment and Plan: * GI bleed - Prior history noted similar with hematemesis.  He also describes dark stools and has not been able to have outpt colonoscopy due to homelessness - continues to consume alcohol - last EGD June 2023 noted with peptic duodenitis and reactive gastropathy with mild chronic gastritis -Currently hemoglobin stable.  Continue trending; no further episodes since admission - GI following, appreciate assistance.  Tentative plan for more what sounds like inpatient EGD.  Patient may also benefit from colonoscopy if having difficulty obtaining outpatient; decision deferred to GI   Cervical spine fracture (HCC) - s/p mechanical fall also with contribution from alcohol intoxication -CT C-spine shows "possible subtle nondisplaced fracture through the superior articular facet of C7 on the right" -Neurosurgery recommending c-collar and outpatient follow-up   Alcohol dependence syndrome ([place]) - Ethanol level 360 on admission -Patient endorsed that his last drink was Tuesday evening - Continue CIWA protocol -Continue thiamine, folate, multivitamin   Pancytopenia ([place]) - Again, suspected due to bone marrow suppression from chronic alcohol use   Hypokalemia - Replete as needed   Thrombocytopenia ([place]) - Likely due to bone marrow suppression from chronic alcohol use - Continue trending   Essential hypertension - Resume amlodipine   Transaminitis - Due to chronic alcohol use - Continue trending       Principal Diagnosis: GI bleed   Discharge Diagnoses:  Active  Hospital Problems   Diagnosis Date Noted  GI bleed 01/30/2022     Priority: 1.  Cervical spine fracture ([place]) 01/31/2022     Priority: 2.  Alcohol dependence syndrome ([place]) 06/21/2019     Priority: 4.  GIB (gastrointestinal bleeding) 01/31/2022  Pancytopenia ([place]) 01/31/2022  Hypokalemia    Thrombocytopenia ([place]) 05/09/2021  Transaminitis    Essential hypertension 03/17/2014    This patient is attempting to get disability has multiple medical problems that are not being well addressed and are contributing to his disability.  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  Patient is also having difficulty with ambulation needs a walker for this.  He is still drinking about 1-2 small airplane bottles of vodka daily.  Disability paperwork was received.      Patient Active Problem List   Diagnosis Date Noted   Cervical spine fracture ([place]) 01/31/2022   Pancytopenia ([place]) 01/31/2022   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   Hypokalemia    Portal vein thrombosis 71/69/6789   Alcoholic pancreatitis 38/11/1749   Alcohol-induced chronic  pancreatitis ([place]) 08/02/2021   Anxiety and depression 07/04/2021   Homeless 07/04/2021   Osteoarthritis 07/04/2021   Thrombocytopenia ([place]) 05/09/2021   Pancreatic insufficiency 05/08/2021   Chronic alcohol abuse 76/73/4193   Alcoholic steatohepatitis 79/03/4095   Iron deficiency anemia due to chronic blood loss 12/07/2020   Seizure ([place]) 12/04/2020   Acute pancreatitis 09/16/2020   Alcohol withdrawal ([place]) 09/16/2020   Chronic pain syndrome 12/28/2019   Pancreatic pseudocyst 10/28/2019   Gastritis and gastroduodenitis    AVM (arteriovenous malformation) of small bowel, acquired    Transaminitis    Alcohol dependence syndrome ([place]) 06/21/2019    Chronic anemia 11/23/2015   Tobacco abuse 03/17/2014   Essential hypertension 03/17/2014   Past Medical History:  Diagnosis Date   Alcohol withdrawal syndrome with complication (HCC)    Alcoholism ([place])    Elevated AST (SGOT)    Gastrointestinal hemorrhage    Homeless    Hypertension    Pancreatic insufficiency    takes Creon   Symptomatic anemia 11/23/2015   Thrombocytopenia ([place]) 05/09/2021   Past Surgical History:  Procedure Laterality Date   BIOPSY  06/23/2019   Procedure: BIOPSY;  Surgeon: Lavena Bullion, DO;  Location: [facility];  Service: Gastroenterology;;   BIOPSY  12/12/2020   Procedure: BIOPSY;  Surgeon: Jackquline Denmark, MD;  Location: [facility] ENDOSCOPY;  Service: Endoscopy;;   BIOPSY  08/04/2021   Procedure: BIOPSY;  Surgeon: Daryel November, MD;  Location: [facility] ENDOSCOPY;  Service: Gastroenterology;;   ESOPHAGOGASTRODUODENOSCOPY N/A 08/04/2021   Procedure: ESOPHAGOGASTRODUODENOSCOPY (EGD);  Surgeon: Daryel November, MD;  Location: [facility] ENDOSCOPY;  Service: Gastroenterology;  Laterality: N/A;   ESOPHAGOGASTRODUODENOSCOPY (EGD) WITH PROPOFOL N/A 06/23/2019   Procedure: ESOPHAGOGASTRODUODENOSCOPY (EGD) WITH PROPOFOL;  Surgeon: Lavena Bullion, DO;  Location: [facility];  Service: Gastroenterology;  Laterality: N/A;   ESOPHAGOGASTRODUODENOSCOPY (EGD) WITH PROPOFOL N/A 12/12/2020   Procedure: ESOPHAGOGASTRODUODENOSCOPY (EGD) WITH PROPOFOL;  Surgeon: Jackquline Denmark, MD;  Location: [facility] ENDOSCOPY;  Service: Endoscopy;  Laterality: N/A;   HOT HEMOSTASIS N/A 06/23/2019   Procedure: HOT HEMOSTASIS (ARGON PLASMA COAGULATION/BICAP);  Surgeon: Lavena Bullion, DO;  Location: [facility] ENDOSCOPY;  Service: Gastroenterology;  Laterality: N/A;   Social History   Tobacco Use   Smoking status: Every Day    Packs/day: 1.00    Years: 30.00    Total pack years: 30.00    Types: Cigarettes   Smokeless tobacco: Never  Vaping Use   Vaping Use: Never used  Substance Use Topics   Alcohol  use: Yes   Drug use: Never   Social History   Socioeconomic History   Marital status: Single    Spouse name: Not on file   Number of children: Not on file   Years of education: Not on file   Highest education level: Not on file  Occupational History   Not on file  Tobacco Use   Smoking status: Every Day    Packs/day: 1.00    Years: 30.00    Total pack years: 30.00    Types: Cigarettes   Smokeless tobacco: Never  Vaping Use   Vaping Use: Never used  Substance and Sexual Activity   Alcohol use: Yes   Drug use: Never   Sexual activity: Not Currently  Other Topics Concern   Not on file  Social History Narrative   Not on file   Social Determinants of Health   Financial Resource Strain: Not on file  Food Insecurity: Not on file  Transportation Needs: Not on file  Physical  Activity: Not on file  Stress: Not on file  Social Connections: Not on file  Intimate Partner Violence: Not on file   Family Status  Relation Name Status   Neg Hx  (Not Specified)   Family History  Problem Relation Age of Onset   Diabetes Mellitus II Neg Hx    Colon cancer Neg Hx    Stomach cancer Neg Hx    Pancreatic cancer Neg Hx    No Known Allergies    Review of Systems  Constitutional:  Positive for malaise/fatigue and weight loss. Negative for chills, diaphoresis and fever.  HENT:  Positive for nosebleeds. Negative for congestion, ear discharge, ear pain, hearing loss, sore throat and tinnitus.   Eyes:  Negative for blurred vision, double vision, photophobia and discharge.  Respiratory:  Negative for cough, hemoptysis, sputum production, shortness of breath, wheezing and stridor.        No excess mucus  Cardiovascular:  Positive for leg swelling. Negative for chest pain, palpitations, orthopnea, claudication and PND.  Gastrointestinal:  Positive for abdominal pain, blood in stool, diarrhea, heartburn, melena, nausea and vomiting. Negative for constipation.  Genitourinary:  Negative for  dysuria, flank pain, frequency, hematuria and urgency.  Musculoskeletal:  Negative for back pain, falls, joint pain, myalgias and neck pain.       Gait disturbance  Skin:  Negative for itching and rash.  Neurological:  Positive for dizziness, weakness and headaches. Negative for tingling, tremors, sensory change, speech change, focal weakness, seizures and loss of consciousness.  Endo/Heme/Allergies:  Negative for environmental allergies and polydipsia. Does not bruise/bleed easily.  Psychiatric/Behavioral:  Positive for depression and memory loss. Negative for hallucinations, substance abuse and suicidal ideas. The patient is nervous/anxious. The patient does not have insomnia.   All other systems reviewed and are negative.     Objective:     BP (!) 175/95 (BP Location: Left Arm, Patient Position: Sitting, Cuff Size: Large)   Pulse 86   Temp 99.1 F (37.3 C) (Oral)   Resp 16   Ht _0  (1.753 m)   Wt 179 lb (81.2 kg)   SpO2 97%   BMI 26.43 kg/m  BP Readings from Last 3 Encounters:  02/14/22 (!) 175/95  02/01/22 123/70  01/22/22 (!) 162/75   Wt Readings from Last 3 Encounters:  02/14/22 179 lb (81.2 kg)  01/31/22 175 lb 14.8 oz (79.8 kg)  12/17/21 179 lb 0.2 oz (81.2 kg)      Physical Exam Vitals reviewed.  Constitutional:      Appearance: Normal appearance. He is well-developed. He is ill-appearing. He is not diaphoretic.  HENT:     Head: Normocephalic and atraumatic.     Nose: Nose normal. No nasal deformity, septal deviation, mucosal edema, congestion or rhinorrhea.     Right Sinus: No maxillary sinus tenderness or frontal sinus tenderness.     Left Sinus: No maxillary sinus tenderness or frontal sinus tenderness.     Mouth/Throat:     Mouth: Mucous membranes are dry.     Pharynx: Oropharynx is clear. No oropharyngeal exudate.  Eyes:     General: No scleral icterus.    Conjunctiva/sclera: Conjunctivae normal.     Pupils: Pupils are equal, round, and reactive to  light.  Neck:     Thyroid: No thyromegaly.     Vascular: No carotid bruit or JVD.     Trachea: Trachea normal. No tracheal tenderness or tracheal deviation.     Comments: Neck brace in place Cardiovascular:  Rate and Rhythm: Normal rate and regular rhythm.     Chest Wall: PMI is not displaced.     Pulses: Normal pulses. No decreased pulses.     Heart sounds: Normal heart sounds, S1 normal and S2 normal. Heart sounds not distant. No murmur heard.    No systolic murmur is present.     No diastolic murmur is present.     No friction rub. No gallop. No S3 or S4 sounds.  Pulmonary:     Effort: No tachypnea, accessory muscle usage or respiratory distress.     Breath sounds: No stridor. No decreased breath sounds, wheezing, rhonchi or rales.  Chest:     Chest wall: No tenderness.  Abdominal:     General: Bowel sounds are normal. There is distension.     Palpations: Abdomen is not rigid. There is no mass.     Tenderness: There is abdominal tenderness. There is guarding. There is no right CVA tenderness, left CVA tenderness or rebound.     Hernia: No hernia is present.  Musculoskeletal:        General: Normal range of motion.     Cervical back: Normal range of motion and neck supple. No edema, erythema, rigidity or tenderness. No muscular tenderness. Normal range of motion.  Lymphadenopathy:     Head:     Right side of head: No submental or submandibular adenopathy.     Left side of head: No submental or submandibular adenopathy.     Cervical: No cervical adenopathy.  Skin:    General: Skin is warm and dry.     Coloration: Skin is not pale.     Findings: No rash.     Nails: There is no clubbing.  Neurological:     General: No focal deficit present.     Mental Status: He is alert and oriented to person, place, and time.     Sensory: Sensory deficit present.     Motor: Weakness present.     Coordination: Coordination abnormal.     Gait: Gait abnormal.     Deep Tendon Reflexes:  Reflexes abnormal.     Comments: Resting tremor  Psychiatric:        Attention and Perception: Attention and perception normal.        Mood and Affect: Mood is depressed. Mood is not anxious. Affect is tearful.        Speech: Speech normal.        Behavior: Behavior normal. Behavior is cooperative.        Thought Content: Thought content normal. Thought content does not include homicidal or suicidal ideation. Thought content does not include homicidal or suicidal plan.        Cognition and Memory: Cognition is impaired. Memory is impaired. He exhibits impaired recent memory and impaired remote memory.        Judgment: Judgment is impulsive.      No results found for any visits on 02/14/22.   Last CBC Lab Results  Component Value Date   WBC 3.7 (L) 02/01/2022   HGB 12.4 (L) 02/01/2022   HCT 37.3 (L) 02/01/2022   MCV 91.9 02/01/2022   MCH 30.5 02/01/2022   RDW 15.8 (H) 02/01/2022   PLT 124 (L) 73/71/0626   Last metabolic panel Lab Results  Component Value Date   GLUCOSE 100 (H) 02/01/2022   NA 132 (L) 02/01/2022   K 3.1 (L) 02/01/2022   CL 95 (L) 02/01/2022   CO2 24 02/01/2022  BUN 5 (L) 02/01/2022   CREATININE 0.56 (L) 02/01/2022   EGFR 109 08/09/2021   CALCIUM 8.9 02/01/2022   PHOS 4.4 05/12/2021   PROT 8.3 (H) 02/01/2022   ALBUMIN 3.6 02/01/2022   LABGLOB 3.8 08/09/2021   AGRATIO 1.1 (L) 08/09/2021   BILITOT 2.2 (H) 02/01/2022   ALKPHOS 98 02/01/2022   AST 86 (H) 02/01/2022   ALT 30 02/01/2022   ANIONGAP 13 02/01/2022   Last lipids Lab Results  Component Value Date   CHOL 158 08/02/2021   HDL 71 08/02/2021   LDLCALC 73 08/02/2021   TRIG 73 08/02/2021   CHOLHDL 2.2 08/02/2021   Last hemoglobin A1c Lab Results  Component Value Date   HGBA1C 5.2 08/02/2021   Last thyroid functions Lab Results  Component Value Date   TSH 2.216 11/23/2015   Last vitamin D No results found for: "25OHVITD2", "25OHVITD3", "VD25OH" Last vitamin B12 and Folate Lab  Results  Component Value Date   ZOXWRUEA54 098 12/04/2020   FOLATE 28.8 05/07/2021      The 10-year ASCVD risk score (Arnett DK, et al., 2019) is: 28.4%    Assessment & Plan:   Problem List Items Addressed This Visit       Cardiovascular and Mediastinum   Essential hypertension - Primary    Patient actively drinking blood pressure not well-controlled in this circumstance also smoking 1/2 pack daily of cigarettes  For now continue current medication including amlodipine 5 mg daily which she takes irregularly      Relevant Orders   Comprehensive metabolic panel     Digestive   Alcoholic steatohepatitis    Liver function abnormality in past due to image demonstrated alcohol steatohepatitis      Relevant Orders   Comprehensive metabolic panel   Chronic alcoholic gastritis without hemorrhage    Continue gastritis will likely refer to gastroenterology when she is cleared by neurosurgery      RESOLVED: Pancreatitis   Relevant Orders   Lipase   RESOLVED: GIB (gastrointestinal bleeding)   Relevant Orders   CBC with Differential/Platelet     Musculoskeletal and Integument   Cervical spine fracture ([place])    Recent fall with C7 nondisplaced cervical fracture has follow-up with neurosurgery on January 3 for to testing imaging and treatments he still has a brace in place        Hematopoietic and Hemostatic   Thrombocytopenia (HCC)   Relevant Orders   CBC with Differential/Platelet   Pancytopenia ([place])    Follow-up blood counts      Relevant Orders   CBC with Differential/Platelet     Other   Iron deficiency anemia due to chronic blood loss (Chronic)    Continue with iron supplement      Alcohol dependence syndrome (HCC)    Undergoing some withdrawal he does have Ativan but takes it irregularly      Transaminitis    Follow-up liver enzymes      Chronic alcohol abuse   Relevant Orders   Ethanol   Hypokalemia    Follow-up potassium      Follow-up with  Herbie Baltimore in 1 week  45 minutes spent in care coordination  Asencion Noble, MD

## 2022-02-14 NOTE — Assessment & Plan Note (Signed)
Follow up potassium

## 2022-02-14 NOTE — Assessment & Plan Note (Signed)
Continue with iron supplement 

## 2022-02-14 NOTE — Assessment & Plan Note (Signed)
Undergoing some withdrawal he does have Ativan but takes it irregularly

## 2022-02-14 NOTE — Progress Notes (Signed)
Request refill on amlodipine and needs more Tylenol Back and knee pain 7/10, constant discomfort

## 2022-02-14 NOTE — Telephone Encounter (Signed)
Dr.Wright confirmed appt for xrays and f/u on 02/27/2022.

## 2022-02-14 NOTE — Assessment & Plan Note (Signed)
Follow-up liver enzymes °

## 2022-02-14 NOTE — Assessment & Plan Note (Signed)
Continue gastritis will likely refer to gastroenterology when she is cleared by neurosurgery

## 2022-02-14 NOTE — Patient Instructions (Signed)
Complete screening labs will be obtained today  No change in medications  You have an appointment with neurosurgery January 3 get your to [facility] for that appointment  Try to minimize your alcohol intake as you are doing  Once you see neurosurgery we will likely get you referred to a stomach doctor  Dr. [name] will follow you up at the shelter in 1 week

## 2022-02-19 NOTE — Congregational Nurse Program (Signed)
  Dept: [phone]   Congregational Nurse Program Note  Date of Encounter: 02/19/2022  Clinic visit to have blood pressure checked and for assistance with medications.  Is complaining of minimal neck pain relieved by Tylenol, continues to wear neck brace.  Assisted with filling pillbox for next week, is not taking Gabapentin, todiscussthis with MD. Past Medical History: Past Medical History:  Diagnosis Date   Alcohol withdrawal syndrome with complication (HCC)    Alcoholism (HCC)    Elevated AST (SGOT)    Gastrointestinal hemorrhage    Homeless    Hypertension    Pancreatic insufficiency    takes Creon   Symptomatic anemia 11/23/2015   Thrombocytopenia (HCC) 05/09/2021    Encounter Details:  CNP Questionnaire - 02/19/22 1237       Questionnaire   Ask client: Do you give verbal consent for me to treat you today? 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    Insurance/Financial Assistance Referral N/A    Medication Have Medication Insecurities    Medical Provider Yes    Screening Referrals Made N/A    Medical Referrals Made N/A    Medical Appointment Made N/A    Recently w/o PCP, now 1st time PCP visit completed due to CNs referral or appointment made N/A    Food Have Food Insecurities    Transportation Need transportation assistance    Housing/Utilities No permanent housing    Interpersonal Safety Do not feel safe at current residence    Interventions Advocate/Support;Educate;Counsel;Navigate Healthcare System;Reviewed Medications    Abnormal to Normal Screening Since Last CN Visit N/A    Screenings CN Performed Blood Pressure;Pulse Ox    Sent Client to Lab for: N/A    Did client attend any of the following based off CNs referral or appointments made? N/A    ED Visit Averted N/A    Life-Saving Intervention Made N/A

## 2022-02-19 NOTE — Progress Notes (Signed)
Let patient know pancreas inflammation is better, alcohol level was negative, blood counts are normal, mild inflammation in the liver from alcohol use should improve if he reduce his alcohol use

## 2022-02-20 LAB — COMPREHENSIVE METABOLIC PANEL
ALT: 56 IU/L — ABNORMAL HIGH (ref 0–44)
AST: 155 IU/L — ABNORMAL HIGH (ref 0–40)
Albumin/Globulin Ratio: 1.1 — ABNORMAL LOW (ref 1.2–2.2)
Albumin: 4.5 g/dL (ref 3.8–4.9)
Alkaline Phosphatase: 119 IU/L (ref 44–121)
BUN/Creatinine Ratio: 8 — ABNORMAL LOW (ref 9–20)
BUN: 5 mg/dL — ABNORMAL LOW (ref 6–24)
Bilirubin Total: 0.9 mg/dL (ref 0.0–1.2)
CO2: 23 mmol/L (ref 20–29)
Calcium: 9.6 mg/dL (ref 8.7–10.2)
Chloride: 96 mmol/L (ref 96–106)
Creatinine, Ser: 0.63 mg/dL — ABNORMAL LOW (ref 0.76–1.27)
Globulin, Total: 4.2 g/dL (ref 1.5–4.5)
Glucose: 81 mg/dL (ref 70–99)
Potassium: 3.9 mmol/L (ref 3.5–5.2)
Sodium: 139 mmol/L (ref 134–144)
Total Protein: 8.7 g/dL — ABNORMAL HIGH (ref 6.0–8.5)
eGFR: 111 mL/min/{1.73_m2} (ref >59)

## 2022-02-20 LAB — CBC WITH DIFFERENTIAL/PLATELET
Basophils Absolute: 0 10*3/uL (ref 0.0–0.2)
Basos: 1 %
EOS (ABSOLUTE): 0 10*3/uL (ref 0.0–0.4)
Eos: 1 %
Hematocrit: 37.7 % (ref 37.5–51.0)
Hemoglobin: 12.8 g/dL — ABNORMAL LOW (ref 13.0–17.7)
Immature Grans (Abs): 0 10*3/uL (ref 0.0–0.1)
Immature Granulocytes: 0 %
Lymphocytes Absolute: 0.9 10*3/uL (ref 0.7–3.1)
Lymphs: 19 %
MCH: 31.3 pg (ref 26.6–33.0)
MCHC: 34 g/dL (ref 31.5–35.7)
MCV: 92 fL (ref 79–97)
Monocytes Absolute: 0.4 10*3/uL (ref 0.1–0.9)
Monocytes: 9 %
Neutrophils Absolute: 3.3 10*3/uL (ref 1.4–7.0)
Neutrophils: 70 %
Platelets: 237 10*3/uL (ref 150–450)
RBC: 4.09 x10E6/uL — ABNORMAL LOW (ref 4.14–5.80)
RDW: 14.7 % (ref 11.6–15.4)
WBC: 4.7 10*3/uL (ref 3.4–10.8)

## 2022-02-20 LAB — ETHANOL: Ethanol: 0.08 %

## 2022-02-20 LAB — LIPASE: Lipase: 53 U/L (ref 13–78)

## 2022-02-22 ENCOUNTER — Telehealth (INDEPENDENT_AMBULATORY_CARE_PROVIDER_SITE_OTHER): Payer: Self-pay

## 2022-02-22 NOTE — Telephone Encounter (Signed)
Pt was called and vm was left, Information has been sent to nurse pool.   

## 2022-02-22 NOTE — Telephone Encounter (Signed)
-----   Message from [name], MD sent at 02/19/2022  4:38 PM EST ----- Let patient know pancreas inflammation is better, alcohol level was negative, blood counts are normal, mild inflammation in the liver from alcohol use should improve if he reduce his alcohol use

## 2022-02-26 ENCOUNTER — Other Ambulatory Visit: Payer: Self-pay | Admitting: Orthopedic Surgery

## 2022-02-26 DIAGNOSIS — S12601A Unspecified nondisplaced fracture of seventh cervical vertebra, initial encounter for closed fracture: Secondary | ICD-10-CM

## 2022-02-26 NOTE — Progress Notes (Signed)
Cervical xrays with flex/ext ordered.

## 2022-02-26 NOTE — Progress Notes (Unsigned)
Referring Physician:  [name], MD [address] [place],  [place] [address]  Primary Physician:  [name], MD  History of Present Illness: 02/27/2022 [name] has a history of alcohol misuse disorder, chronic pancreatitis, pancreatic pseudocyst, chronic pancreatic insufficiency, hypertension, chronic anemia and thrombocytopenia, homelessness.   Had a fall and was admitted to hospital. CT of cervical spine 01/30/22 showed possible subtle nondisplaced fracture through the superior articular facet at C7 on the right.   He was placed in collar. Left AMA. PCP referred him here for follow up.   He has no neck pain. No arm pain. No numbness or tingling.  He has been wearing his cervical collar since he left hospital in December. No weakness in his extremities.   He has no current complaints.    Conservative measures:  Physical therapy: no  Multimodal medical therapy including regular antiinflammatories: tylenol, neurontin  Injections: No epidural steroid injections  Past Surgery: no spinal surgery  [name] has no symptoms of cervical myelopathy.   Review of Systems:  A 10 point review of systems is negative, except for the pertinent positives and negatives detailed in the HPI.  Past Medical History: Past Medical History:  Diagnosis Date   Alcohol withdrawal syndrome with complication ([place])    Alcoholism ([place])    Elevated AST (SGOT)    Gastrointestinal hemorrhage    Homeless    Hypertension    Pancreatic insufficiency    takes Creon   Symptomatic anemia 11/23/2015   Thrombocytopenia ([place]) 05/09/2021    Past Surgical History: Past Surgical History:  Procedure Laterality Date   BIOPSY  06/23/2019   Procedure: BIOPSY;  Surgeon: Lavena Bullion, DO;  Location: [facility] ENDOSCOPY;  Service: Gastroenterology;;   BIOPSY  12/12/2020   Procedure: BIOPSY;  Surgeon: Jackquline Denmark, MD;  Location: [facility] ENDOSCOPY;  Service: Endoscopy;;   BIOPSY   08/04/2021   Procedure: BIOPSY;  Surgeon: Daryel November, MD;  Location: [facility] ENDOSCOPY;  Service: Gastroenterology;;   ESOPHAGOGASTRODUODENOSCOPY N/A 08/04/2021   Procedure: ESOPHAGOGASTRODUODENOSCOPY (EGD);  Surgeon: Daryel November, MD;  Location: [facility]COPY;  Service: Gastroenterology;  Laterality: N/A;   ESOPHAGOGASTRODUODENOSCOPY (EGD) WITH PROPOFOL N/A 06/23/2019   Procedure: ESOPHAGOGASTRODUODENOSCOPY (EGD) WITH PROPOFOL;  Surgeon: Lavena Bullion, DO;  Location: [facility];  Service: Gastroenterology;  Laterality: N/A;   ESOPHAGOGASTRODUODENOSCOPY (EGD) WITH PROPOFOL N/A 12/12/2020   Procedure: ESOPHAGOGASTRODUODENOSCOPY (EGD) WITH PROPOFOL;  Surgeon: Jackquline Denmark, MD;  Location: [facility] ENDOSCOPY;  Service: Endoscopy;  Laterality: N/A;   HOT HEMOSTASIS N/A 06/23/2019   Procedure: HOT HEMOSTASIS (ARGON PLASMA COAGULATION/BICAP);  Surgeon: Lavena Bullion, DO;  Location: [facility] ENDOSCOPY;  Service: Gastroenterology;  Laterality: N/A;    Allergies: Allergies as of 02/27/2022   (No Known Allergies)    Medications: Outpatient Encounter Medications as of 02/27/2022  Medication Sig   acetaminophen (TYLENOL) 325 MG tablet Take 2 tablets (650 mg total) by mouth every 6 (six) hours as needed for fever, moderate pain, mild pain or headache. (Patient not taking: Reported on 02/14/2022)   amLODipine (NORVASC) 5 MG tablet Take 1 tablet (5 mg total) by mouth daily. (Patient not taking: Reported on 18/56/3149)   folic acid (FOLVITE) 1 MG tablet Take 1 tablet (1 mg total) by mouth daily.   gabapentin (NEURONTIN) 100 MG capsule Take 1 capsule (100 mg total) by mouth 3 (three) times daily.   Iron, Ferrous Sulfate, 325 (65 Fe) MG TABS Take 1 tablet ( 325 mg) by mouth  daily.   lipase/protease/amylase (CREON) 12000-38000 units CPEP capsule Take 1 capsule (12,000 Units total) by mouth 3 (three) times daily before meals.   Multiple Vitamin (MULTIVITAMIN WITH MINERALS) TABS tablet Take 1 tablet by  mouth daily.   ondansetron (ZOFRAN) 8 MG tablet Take 1 tablet (8 mg total) by mouth every 6 (six) hours as needed for nausea.   pantoprazole (PROTONIX) 40 MG tablet Take 1 tablet (40 mg total) by mouth 2 (two) times daily before a meal.   polyethylene glycol (MIRALAX / GLYCOLAX) 17 g packet Take 17 g by mouth daily as needed for mild constipation or moderate constipation.   senna-docusate (SENOKOT-S) 8.6-50 MG tablet Take 1 tablet by mouth at bedtime as needed for mild constipation.   sucralfate (CARAFATE) 1 g tablet Take 1 tablet (1 g total) by mouth 4 (four) times daily -  with meals and at bedtime.   thiamine (VITAMIN B1) 100 MG tablet Take 1 tablet (100 mg total) by mouth daily.   No facility-administered encounter medications on file as of 02/27/2022.    Social History: Social History   Tobacco Use   Smoking status: Every Day    Packs/day: 1.00    Years: 30.00    Total pack years: 30.00    Types: Cigarettes   Smokeless tobacco: Never  Vaping Use   Vaping Use: Never used  Substance Use Topics   Alcohol use: Yes   Drug use: Never    Family Medical History: Family History  Problem Relation Age of Onset   Diabetes Mellitus II Neg Hx    Colon cancer Neg Hx    Stomach cancer Neg Hx    Pancreatic cancer Neg Hx     Physical Examination: Vitals:   02/27/22 1051  BP: (!) 144/84    General: Patient is well developed, well nourished, calm, collected, and in no apparent distress. Attention to examination is appropriate.  Respiratory: Patient is breathing without any difficulty.   NEUROLOGICAL:     Awake, alert, oriented to person, place, and time.  Speech is clear and fluent. Fund of knowledge is appropriate.   Cranial Nerves: Pupils equal round and reactive to light.  Facial tone is symmetric.    Cervical ROM not tested.  No posterior cervical tenderness noted. No tenderness bilateral trapezial region.   No abnormal lesions on exposed skin.   Strength: Side Biceps  Triceps Deltoid Interossei Grip Wrist Ext. Wrist Flex.  R 5 5 5 5 5 5 5   L 5 5 5 5 5 5 5    Side Iliopsoas Quads Hamstring PF DF EHL  R 5 5 5 5 5 5   L 5 5 5 5 5 5    Reflexes are 2+ and symmetric at the biceps, triceps, brachioradialis, patella and achilles.   Hoffman's is absent.  Clonus is not present.   Bilateral upper and lower extremity sensation is intact to light touch.     Gait is normal.    Medical Decision Making  Imaging: Xrays of cervical spine dated 02/27/22:  Slight slip at C6-C7, cervical spondylosis and DDD C3-C5.  Radiology report for above xrays not available for my review.   CT of cervical spine dated 01/30/22:  Alignment: There is trace anterolisthesis of C6 on C7. Mild retrolisthesis of C3 on C4 and C4 on C5.   Skull base and vertebrae: There is a subtle nondisplaced fracture through the superior articular facet at C7 on the right.   Soft tissues and spinal canal: No prevertebral fluid  or swelling. No visible canal hematoma.   Disc levels: Mild spinal canal narrowing at C2-C3 and C4-C5.   Upper chest: Negative.   Other: None   IMPRESSION: 1. No CT evidence of intracranial injury. 2. Possible subtle nondisplaced fracture through the superior articular facet at C7 on the right. 3. No evidence of facial bone fracture.     Electronically Signed   By: Marin Roberts M.D.   On: 01/30/2022 19:04  I have personally reviewed the images and agree with the above interpretation. CT reviewed with Dr. Joretta Bachelor prior to his visit as well.   Assessment and Plan: Mr. Penado is a pleasant 57 y.o. male with fall on 01/30/22 and possible subtle nondisplaced fracture through the superior articular facet at C7 on the right.  He has been wearing his cervical collar. He has no pain in his neck or arms. No numbness, tingling, or weakness.   Cervical xrays showed slip at C6-C7 with no instability.   Treatment options discussed with patient and his case worker Charlott Rakes. Following plan made:   - Xrays reviewed with Dr. Izora Ribas after his visit. Slip at C6-C7 is not significant. He is cleared to come out of cervical collar.  - He may return to activities as tolerated and follow up prn.  - I called his case manager Charlott Rakes [phone] to let her know. Will also send message to his PCP Dr. Joya Gaskins as well.    I spent a total of 20 minutes in face-to-face and non-face-to-face activities related to this patient's care today including review of outside records, review of imaging, review of symptoms, physical exam, discussion of differential diagnosis, discussion of treatment options, and documentation.   Thank you for involving me in the care of this patient.     Geronimo Boot PA-C Dept. of Neurosurgery

## 2022-02-27 ENCOUNTER — Ambulatory Visit
Admission: RE | Admit: 2022-02-27 | Discharge: 2022-02-27 | Disposition: A | Discharge status: Home / Self Care | Payer: Commercial Managed Care - HMO | Attending: Orthopedic Surgery | Admitting: Orthopedic Surgery

## 2022-02-27 ENCOUNTER — Encounter: Payer: Self-pay | Admitting: Orthopedic Surgery

## 2022-02-27 ENCOUNTER — Ambulatory Visit
Admission: RE | Admit: 2022-02-27 | Discharge: 2022-02-27 | Disposition: A | Discharge status: Home / Self Care | Payer: Commercial Managed Care - HMO | Source: Ambulatory Visit | Attending: Orthopedic Surgery | Admitting: Orthopedic Surgery

## 2022-02-27 ENCOUNTER — Ambulatory Visit (INDEPENDENT_AMBULATORY_CARE_PROVIDER_SITE_OTHER): Payer: Commercial Managed Care - HMO | Admitting: Orthopedic Surgery

## 2022-02-27 VITALS — BP 144/84 | Ht 69.0 in | Wt 179.0 lb

## 2022-02-27 DIAGNOSIS — W19XXXA Unspecified fall, initial encounter: Secondary | ICD-10-CM

## 2022-02-27 DIAGNOSIS — S12601A Unspecified nondisplaced fracture of seventh cervical vertebra, initial encounter for closed fracture: Secondary | ICD-10-CM | POA: Diagnosis present

## 2022-02-27 DIAGNOSIS — M542 Cervicalgia: Secondary | ICD-10-CM

## 2022-03-04 ENCOUNTER — Ambulatory Visit: Payer: Commercial Managed Care - HMO | Admitting: Physician Assistant

## 2022-03-04 NOTE — Progress Notes (Deleted)
Patient ID: Samuel Blevins, male   DOB: 25-May-1965, 57 y.o.   MRN: [id number]  Seen by Dr Joya Gaskins  post hospital 02/14/2022.  From Dr Joya Gaskins A/P: Essential hypertension - Primary       Patient actively drinking blood pressure not well-controlled in this circumstance also smoking 1/2 pack daily of cigarettes   For now continue current medication including amlodipine 5 mg daily which she takes irregularly        Relevant Orders    Comprehensive metabolic panel        Digestive    Alcoholic steatohepatitis      Liver function abnormality in past due to image demonstrated alcohol steatohepatitis        Relevant Orders    Comprehensive metabolic panel    Chronic alcoholic gastritis without hemorrhage      Continue gastritis will likely refer to gastroenterology when she is cleared by neurosurgery        RESOLVED: Pancreatitis    Relevant Orders    Lipase    RESOLVED: GIB (gastrointestinal bleeding)    Relevant Orders    CBC with Differential/Platelet        Musculoskeletal and Integument    Cervical spine fracture ([place])      Recent fall with C7 nondisplaced cervical fracture has follow-up with neurosurgery on January 3 for to testing imaging and treatments he still has a brace in place            Hematopoietic and Hemostatic    Thrombocytopenia (HCC)    Relevant Orders    CBC with Differential/Platelet    Pancytopenia ([place])      Follow-up blood counts        Relevant Orders    CBC with Differential/Platelet        Other    Iron deficiency anemia due to chronic blood loss (Chronic)      Continue with iron supplement        Alcohol dependence syndrome (HCC)      Undergoing some withdrawal he does have Ativan but takes it irregularly        Transaminitis      Follow-up liver enzymes        Chronic alcohol abuse    Relevant Orders    Ethanol    Hypokalemia      Follow-up potassium

## 2022-03-06 ENCOUNTER — Other Ambulatory Visit: Payer: Self-pay | Admitting: Critical Care Medicine

## 2022-03-06 ENCOUNTER — Encounter: Payer: Self-pay | Admitting: Physician Assistant

## 2022-03-06 ENCOUNTER — Other Ambulatory Visit: Payer: Self-pay

## 2022-03-06 MED ORDER — CLOBETASOL PROPIONATE 0.05 % EX CREA
1.0000 | TOPICAL_CREAM | Freq: Two times a day (BID) | CUTANEOUS | 0 refills | Status: DC
  Filled 2022-03-06: qty 30, 30d supply, fill #0

## 2022-03-06 NOTE — Progress Notes (Signed)
Pt seen by Dr Joya Gaskins.  He is no longer wearing the brace, threw it away. Has some pain in the back of his neck.   Needs help w/ psoriasis.  Labs reviewed w/ pt.      Latest Ref Rng & Units 02/14/2022   10:24 AM 02/01/2022    3:17 AM 01/31/2022    3:02 AM  CMP  Glucose 70 - 99 mg/dL 81  100  79   BUN 6 - 24 mg/dL 5  5  5    Creatinine 0.76 - 1.27 mg/dL 0.63  0.56  0.57   Sodium 134 - 144 mmol/L 139  132  141   Potassium 3.5 - 5.2 mmol/L 3.9  3.1  3.3   Chloride 96 - 106 mmol/L 96  95  105   CO2 20 - 29 mmol/L 23  24  24    Calcium 8.7 - 10.2 mg/dL 9.6  8.9  8.3   Total Protein 6.0 - 8.5 g/dL 8.7  8.3  7.7   Total Bilirubin 0.0 - 1.2 mg/dL 0.9  2.2  0.9   Alkaline Phos 44 - 121 IU/L 119  98  87   AST 0 - 40 IU/L 155  86  120   ALT 0 - 44 IU/L 56  30  31    CBC    Component Value Date/Time   WBC 4.7 02/14/2022 1024   RBC 4.09 (L) 02/14/2022 1024   HGB 12.8 (L) 02/14/2022 1024   HCT 37.7 02/14/2022 1024   PLT 237 02/14/2022 1024   MCV 92 02/14/2022 1024   MCH 31.3 02/14/2022 1024   MCH 30.5 02/01/2022 0317   MCHC 34.0 02/14/2022 1024   MCHC 33.2 02/01/2022 0317   RDW 14.7 02/14/2022 1024   LYMPHSABS 0.9 02/14/2022 1024   MONOABS 0.4 02/01/2022 0317   EOSABS 0.0 02/14/2022 1024   BASOSABS 0.0 02/14/2022 1024   Says is vomiting once in the morning.   Says has knots on his back, needs something for that.   Meds reviewed, med for constipation from 09/2021 removed. Augmenting (from November) removed.   Not taking much Tylenol.   Exam per Dr Joya Gaskins.   Med box filled. Folic acid was in bid >> qd. Pt requested MVI be am >> done.   Rosaria Ferries, PA-C 03/06/2022 4:21 PM

## 2022-03-06 NOTE — Progress Notes (Unsigned)
Pt seen by Dr Joya Gaskins.

## 2022-03-07 ENCOUNTER — Other Ambulatory Visit (HOSPITAL_COMMUNITY): Payer: Self-pay

## 2022-03-07 ENCOUNTER — Other Ambulatory Visit: Payer: Self-pay

## 2022-03-19 NOTE — Congregational Nurse Program (Signed)
  Dept: [phone]   Congregational Nurse Program Note  Date of Encounter: 03/19/2022  Clinic visit to assist with medications, reviewed all medications and assisted with filing pillbox.  Discussed methods for decreasing use of cigarettes. Past Medical History: Past Medical History:  Diagnosis Date   Alcohol withdrawal syndrome with complication (HCC)    Alcoholism ([place])    Elevated AST (SGOT)    Gastrointestinal hemorrhage    Homeless    Hypertension    Pancreatic insufficiency    takes Creon   Symptomatic anemia 11/23/2015   Thrombocytopenia ([place]) 05/09/2021    Encounter Details:  CNP Questionnaire - 03/19/22 1125       Questionnaire   Ask client: Do you give verbal consent for me to treat you today? 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[place]    Insurance/Financial Assistance Referral N/A    Medication Have Medication Insecurities    Medical Provider Yes    Screening Referrals Made N/A    Medical Referrals Made N/A    Medical Appointment Made N/A    Recently w/o PCP, now 1st time PCP visit completed due to CNs referral or appointment made N/A    Food Have Food Insecurities    Transportation Need transportation assistance    Housing/Utilities No permanent housing    Interpersonal Safety Do not feel safe at current residence    Interventions Advocate/Support;Educate;Counsel;Reviewed Medications    Abnormal to Normal Screening Since Last CN Visit N/A    Screenings CN Performed N/A    Sent Client to Lab for: N/A    Did client attend any of the following based off CNs referral or appointments made? N/A    ED Visit Averted N/A    Life-Saving Intervention Made N/A

## 2022-03-20 ENCOUNTER — Other Ambulatory Visit: Payer: Self-pay

## 2022-03-20 ENCOUNTER — Other Ambulatory Visit: Payer: Self-pay | Admitting: Critical Care Medicine

## 2022-03-20 ENCOUNTER — Encounter: Payer: Self-pay | Admitting: Critical Care Medicine

## 2022-03-20 MED ORDER — OFLOXACIN 0.3 % OP SOLN
1.0000 [drp] | Freq: Four times a day (QID) | OPHTHALMIC | 0 refills | Status: DC
  Filled 2022-03-20: qty 5, 12d supply, fill #0

## 2022-03-21 ENCOUNTER — Other Ambulatory Visit: Payer: Self-pay | Admitting: Critical Care Medicine

## 2022-03-21 ENCOUNTER — Other Ambulatory Visit (HOSPITAL_COMMUNITY): Payer: Self-pay

## 2022-03-21 DIAGNOSIS — H2589 Other age-related cataract: Secondary | ICD-10-CM

## 2022-03-21 NOTE — Progress Notes (Signed)
This a 57 year old male seen today for left eye pain at the [facility] is also still having abdominal pain as well he drinks 4 airplane bottles of vodka daily down from a gallon a day.  He has blood in the stool and blood in his emesis.  He has not been using the nausea medicine.  He has a cataract in the left eye and drainage from the left eye.  On exam blood pressure 141/86 saturation 98% pulse 105 there is purulence coming from both eyes with conjunctival bacterial conjunctivitis Abdomen is tender epigastric area as it has been before remainder the exam unremarkable  Impression is that of cataracts left eye and bilateral bacterial conjunctivitis for this we will prescribe ofloxacin eyedrops  Patient's medication organizer demonstrated poor adherence to medications I have pulled out his medicines for today and had him take them in front of [place] with water instructed him to take each [place] and take it with him today and take it throughout the day he has enough refills to get through the next week

## 2022-03-21 NOTE — Congregational Nurse Program (Signed)
  Dept: [phone]   Congregational Nurse Program Note  Date of Encounter: 03/21/2022  Client educated about eye drops, nurse demonstrated and provided initial application ofloxacin 1.6%, 1 drop both eyes, educated client to apply 1 drop to each eye 4 times per day. Client repeated instructions and verbalized understanding.    Past Medical History: Past Medical History:  Diagnosis Date   Alcohol withdrawal syndrome with complication ([place])    Alcoholism ([place])    Elevated AST (SGOT)    Gastrointestinal hemorrhage    Homeless    Hypertension    Pancreatic insufficiency    takes Creon   Symptomatic anemia 11/23/2015   Thrombocytopenia ([place]) 05/09/2021    Encounter Details:  CNP Questionnaire - 03/21/22 1547       Questionnaire   Ask client: Do you give verbal consent for me to treat you today? 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[place]    Insurance/Financial Assistance Referral N/A    Medication Have Medication Insecurities    Medical Provider Yes    Screening Referrals Made N/A    Medical Referrals Made N/A    Medical Appointment Made N/A    Recently w/o PCP, now 1st time PCP visit completed due to CNs referral or appointment made N/A    Food Have Food Insecurities    Transportation Need transportation assistance    Housing/Utilities No permanent housing    Interpersonal Safety Do not feel safe at current residence    Interventions Advocate/Support;Educate;Reviewed Medications    Abnormal to Normal Screening Since Last CN Visit N/A    Screenings CN Performed N/A    Sent Client to Lab for: N/A    Did client attend any of the following based off CNs referral or appointments made? N/A    ED Visit Averted N/A    Life-Saving Intervention Made N/A

## 2022-03-27 ENCOUNTER — Encounter: Payer: Self-pay | Admitting: Physician Assistant

## 2022-03-27 NOTE — Progress Notes (Signed)
Pt seen by Dr Joya Gaskins.  Has not heard from cataract doctor, they called yesterday, but he did not answer. Their schedule is a month out.   Eye Drops are completed, they did not help his bilateral eye pain. PW was able to get an appt for tomorrow to evaluate this. Tomorrow at 1 pm.   Has been bleeding today from his mouth and R nostril.   Was given nasal rinse to use 3-4 x day and Kleenexes.  Rosaria Ferries, PA-C 03/27/2022 4:48 PM

## 2022-04-03 ENCOUNTER — Other Ambulatory Visit: Payer: Self-pay | Admitting: Critical Care Medicine

## 2022-04-03 DIAGNOSIS — H10403 Unspecified chronic conjunctivitis, bilateral: Secondary | ICD-10-CM

## 2022-04-03 DIAGNOSIS — H539 Unspecified visual disturbance: Secondary | ICD-10-CM

## 2022-04-10 ENCOUNTER — Encounter: Payer: Self-pay | Admitting: Physician Assistant

## 2022-04-10 NOTE — Progress Notes (Cosign Needed)
Pt seen by Dr Joya Gaskins.  Eye MD contacted by PW, they agree to see him,  [facility] on Friday, Feb 16th,  11 am, leave at 10:15. Importance of keeping this appt was emphasized.   He has a new insurance card.   Drinks about 3 airplane bottles a day.  Has been vomiting every day. Says was vomiting blood today.   [place] this am. Was upstairs getting up to go smoke.   He has not been taking his meds. Says does not take meds when he is drinking.   Was given pm doses of meds while here.   Was encouraged not to drink.    Rosaria Ferries, PA-C 04/10/2022 3:07 PM Patient seen and examined along with the PA agree with the above note.  We made him another eye appointment February 16 this Friday at 11 AM I am hopeful he will keep this appointment.  He continues to decline from his alcoholism. Review of pill organizer shows he is not taking any of the medications  Asencion Noble

## 2022-04-11 ENCOUNTER — Other Ambulatory Visit: Payer: Self-pay

## 2022-04-11 ENCOUNTER — Emergency Department (HOSPITAL_COMMUNITY): Payer: Medicaid Other

## 2022-04-11 ENCOUNTER — Inpatient Hospital Stay (HOSPITAL_COMMUNITY)
Admission: EM | Admit: 2022-04-11 | Discharge: 2022-04-15 | DRG: 439 | Discharge status: Against Medical Advice | Payer: Medicaid Other | Attending: Internal Medicine | Admitting: Internal Medicine

## 2022-04-11 ENCOUNTER — Encounter (HOSPITAL_COMMUNITY): Payer: Self-pay | Admitting: Internal Medicine

## 2022-04-11 ENCOUNTER — Inpatient Hospital Stay (HOSPITAL_COMMUNITY): Payer: Medicaid Other

## 2022-04-11 DIAGNOSIS — S0990XA Unspecified injury of head, initial encounter: Secondary | ICD-10-CM | POA: Diagnosis present

## 2022-04-11 DIAGNOSIS — Z79899 Other long term (current) drug therapy: Secondary | ICD-10-CM | POA: Diagnosis not present

## 2022-04-11 DIAGNOSIS — G8929 Other chronic pain: Secondary | ICD-10-CM | POA: Diagnosis present

## 2022-04-11 DIAGNOSIS — F10939 Alcohol use, unspecified with withdrawal, unspecified: Secondary | ICD-10-CM

## 2022-04-11 DIAGNOSIS — K760 Fatty (change of) liver, not elsewhere classified: Secondary | ICD-10-CM | POA: Diagnosis present

## 2022-04-11 DIAGNOSIS — K219 Gastro-esophageal reflux disease without esophagitis: Secondary | ICD-10-CM | POA: Diagnosis present

## 2022-04-11 DIAGNOSIS — R000 Tachycardia, unspecified: Secondary | ICD-10-CM | POA: Diagnosis not present

## 2022-04-11 DIAGNOSIS — F10932 Alcohol use, unspecified with withdrawal with perceptual disturbance: Secondary | ICD-10-CM | POA: Diagnosis not present

## 2022-04-11 DIAGNOSIS — R17 Unspecified jaundice: Secondary | ICD-10-CM | POA: Diagnosis not present

## 2022-04-11 DIAGNOSIS — E162 Hypoglycemia, unspecified: Secondary | ICD-10-CM | POA: Diagnosis present

## 2022-04-11 DIAGNOSIS — R569 Unspecified convulsions: Principal | ICD-10-CM

## 2022-04-11 DIAGNOSIS — F10239 Alcohol dependence with withdrawal, unspecified: Secondary | ICD-10-CM | POA: Diagnosis present

## 2022-04-11 DIAGNOSIS — R7401 Elevation of levels of liver transaminase levels: Secondary | ICD-10-CM | POA: Diagnosis not present

## 2022-04-11 DIAGNOSIS — D61818 Other pancytopenia: Secondary | ICD-10-CM | POA: Diagnosis present

## 2022-04-11 DIAGNOSIS — Z1152 Encounter for screening for COVID-19: Secondary | ICD-10-CM

## 2022-04-11 DIAGNOSIS — R0902 Hypoxemia: Secondary | ICD-10-CM

## 2022-04-11 DIAGNOSIS — Z5329 Procedure and treatment not carried out because of patient's decision for other reasons: Secondary | ICD-10-CM | POA: Diagnosis present

## 2022-04-11 DIAGNOSIS — F17210 Nicotine dependence, cigarettes, uncomplicated: Secondary | ICD-10-CM | POA: Diagnosis present

## 2022-04-11 DIAGNOSIS — Z5901 Sheltered homelessness: Secondary | ICD-10-CM

## 2022-04-11 DIAGNOSIS — K852 Alcohol induced acute pancreatitis without necrosis or infection: Secondary | ICD-10-CM | POA: Diagnosis present

## 2022-04-11 DIAGNOSIS — Y904 Blood alcohol level of 80-99 mg/100 ml: Secondary | ICD-10-CM | POA: Diagnosis present

## 2022-04-11 DIAGNOSIS — I10 Essential (primary) hypertension: Secondary | ICD-10-CM | POA: Diagnosis present

## 2022-04-11 DIAGNOSIS — E871 Hypo-osmolality and hyponatremia: Secondary | ICD-10-CM | POA: Diagnosis not present

## 2022-04-11 DIAGNOSIS — K860 Alcohol-induced chronic pancreatitis: Secondary | ICD-10-CM | POA: Diagnosis present

## 2022-04-11 DIAGNOSIS — F10931 Alcohol use, unspecified with withdrawal delirium: Secondary | ICD-10-CM | POA: Diagnosis not present

## 2022-04-11 DIAGNOSIS — Z5900 Homelessness unspecified: Secondary | ICD-10-CM | POA: Diagnosis not present

## 2022-04-11 DIAGNOSIS — W01198A Fall on same level from slipping, tripping and stumbling with subsequent striking against other object, initial encounter: Secondary | ICD-10-CM | POA: Diagnosis not present

## 2022-04-11 DIAGNOSIS — E876 Hypokalemia: Secondary | ICD-10-CM

## 2022-04-11 LAB — COMPREHENSIVE METABOLIC PANEL
ALT: 41 U/L (ref 0–44)
AST: 168 U/L — ABNORMAL HIGH (ref 15–41)
Albumin: 3 g/dL — ABNORMAL LOW (ref 3.5–5.0)
Alkaline Phosphatase: 132 U/L — ABNORMAL HIGH (ref 38–126)
Anion gap: 18 — ABNORMAL HIGH (ref 5–15)
BUN: 5 mg/dL — ABNORMAL LOW (ref 6–20)
CO2: 22 mmol/L (ref 22–32)
Calcium: 8.4 mg/dL — ABNORMAL LOW (ref 8.9–10.3)
Chloride: 94 mmol/L — ABNORMAL LOW (ref 98–111)
Creatinine, Ser: 0.6 mg/dL — ABNORMAL LOW (ref 0.61–1.24)
GFR, Estimated: >60 mL/min (ref >60)
Glucose, Bld: 92 mg/dL (ref 70–99)
Potassium: 3.1 mmol/L — ABNORMAL LOW (ref 3.5–5.1)
Sodium: 134 mmol/L — ABNORMAL LOW (ref 135–145)
Total Bilirubin: 1 mg/dL (ref 0.3–1.2)
Total Protein: 7.6 g/dL (ref 6.5–8.1)

## 2022-04-11 LAB — RAPID URINE DRUG SCREEN, HOSP PERFORMED
Amphetamines: NOT DETECTED
Barbiturates: NOT DETECTED
Benzodiazepines: NOT DETECTED
Cocaine: NOT DETECTED
Opiates: NOT DETECTED
Tetrahydrocannabinol: NOT DETECTED

## 2022-04-11 LAB — CBC WITH DIFFERENTIAL/PLATELET
Abs Immature Granulocytes: 0.03 10*3/uL (ref 0.00–0.07)
Basophils Absolute: 0 10*3/uL (ref 0.0–0.1)
Basophils Relative: 1 %
Eosinophils Absolute: 0 10*3/uL (ref 0.0–0.5)
Eosinophils Relative: 0 %
HCT: 32.4 % — ABNORMAL LOW (ref 39.0–52.0)
Hemoglobin: 11.4 g/dL — ABNORMAL LOW (ref 13.0–17.0)
Immature Granulocytes: 1 %
Lymphocytes Relative: 10 %
Lymphs Abs: 0.5 10*3/uL — ABNORMAL LOW (ref 0.7–4.0)
MCH: 32.2 pg (ref 26.0–34.0)
MCHC: 35.2 g/dL (ref 30.0–36.0)
MCV: 91.5 fL (ref 80.0–100.0)
Monocytes Absolute: 0.4 10*3/uL (ref 0.1–1.0)
Monocytes Relative: 8 %
Neutro Abs: 4.1 10*3/uL (ref 1.7–7.7)
Neutrophils Relative %: 80 %
Platelets: 146 10*3/uL — ABNORMAL LOW (ref 150–400)
RBC: 3.54 MIL/uL — ABNORMAL LOW (ref 4.22–5.81)
RDW: 16.6 % — ABNORMAL HIGH (ref 11.5–15.5)
WBC: 5 10*3/uL (ref 4.0–10.5)
nRBC: 0 % (ref 0.0–0.2)

## 2022-04-11 LAB — RESP PANEL BY RT-PCR (RSV, FLU A&B, COVID)  RVPGX2
Influenza A by PCR: NEGATIVE
Influenza B by PCR: NEGATIVE
Resp Syncytial Virus by PCR: NEGATIVE
SARS Coronavirus 2 by RT PCR: NEGATIVE

## 2022-04-11 LAB — URINALYSIS, ROUTINE W REFLEX MICROSCOPIC
Bilirubin Urine: NEGATIVE
Glucose, UA: NEGATIVE mg/dL
Ketones, ur: 5 mg/dL — AB
Leukocytes,Ua: NEGATIVE
Nitrite: POSITIVE — AB
Protein, ur: NEGATIVE mg/dL
Specific Gravity, Urine: 1.026 (ref 1.005–1.030)
pH: 6 (ref 5.0–8.0)

## 2022-04-11 LAB — LIPASE, BLOOD: Lipase: 214 U/L — ABNORMAL HIGH (ref 11–51)

## 2022-04-11 LAB — LACTIC ACID, PLASMA
Lactic Acid, Venous: 4.2 mmol/L (ref 0.5–1.9)
Lactic Acid, Venous: 1.6 mmol/L (ref 0.5–1.9)

## 2022-04-11 LAB — TROPONIN I (HIGH SENSITIVITY)
Troponin I (High Sensitivity): 17 ng/L (ref <18)
Troponin I (High Sensitivity): 20 ng/L — ABNORMAL HIGH (ref <18)

## 2022-04-11 LAB — BRAIN NATRIURETIC PEPTIDE: B Natriuretic Peptide: 20.9 pg/mL (ref 0.0–100.0)

## 2022-04-11 LAB — ETHANOL: Alcohol, Ethyl (B): 85 mg/dL — ABNORMAL HIGH (ref <10)

## 2022-04-11 LAB — GLUCOSE, CAPILLARY
Glucose-Capillary: 86 mg/dL (ref 70–99)
Glucose-Capillary: 81 mg/dL (ref 70–99)
Glucose-Capillary: 74 mg/dL (ref 70–99)

## 2022-04-11 LAB — MAGNESIUM: Magnesium: 1.1 mg/dL — ABNORMAL LOW (ref 1.7–2.4)

## 2022-04-11 MED ORDER — MAGNESIUM SULFATE 2 GM/50ML IV SOLN
2.0000 g | Freq: Once | INTRAVENOUS | Status: AC
  Administered 2022-04-11: 2 g via INTRAVENOUS
  Filled 2022-04-11: qty 50

## 2022-04-11 MED ORDER — PANCRELIPASE (LIP-PROT-AMYL) 12000-38000 UNITS PO CPEP
12000.0000 [IU] | ORAL_CAPSULE | Freq: Three times a day (TID) | ORAL | Status: DC
  Administered 2022-04-11 – 2022-04-15 (×6): 12000 [IU] via ORAL
  Filled 2022-04-11 (×14): qty 1

## 2022-04-11 MED ORDER — SENNOSIDES-DOCUSATE SODIUM 8.6-50 MG PO TABS: 1.0000 | ORAL_TABLET | Freq: Every evening | ORAL | Status: DC | PRN

## 2022-04-11 MED ORDER — CHLORDIAZEPOXIDE HCL 5 MG PO CAPS
25.0000 mg | ORAL_CAPSULE | ORAL | Status: DC
  Filled 2022-04-11: qty 5

## 2022-04-11 MED ORDER — LORAZEPAM 2 MG/ML IJ SOLN
0.0000 mg | Freq: Four times a day (QID) | INTRAMUSCULAR | Status: DC
  Filled 2022-04-11: qty 2

## 2022-04-11 MED ORDER — LORAZEPAM 2 MG/ML IJ SOLN
0.0000 mg | INTRAMUSCULAR | Status: DC
  Administered 2022-04-11 (×2): 2 mg via INTRAVENOUS
  Administered 2022-04-11: 1 mg via INTRAVENOUS
  Administered 2022-04-12 – 2022-04-13 (×8): 2 mg via INTRAVENOUS
  Filled 2022-04-11 (×11): qty 1

## 2022-04-11 MED ORDER — LORAZEPAM 2 MG/ML IJ SOLN: 0.0000 mg | Freq: Two times a day (BID) | INTRAMUSCULAR | Status: DC

## 2022-04-11 MED ORDER — SUCRALFATE 1 G PO TABS
1.0000 g | ORAL_TABLET | Freq: Three times a day (TID) | ORAL | Status: DC
  Administered 2022-04-11 – 2022-04-15 (×8): 1 g via ORAL
  Filled 2022-04-11 (×12): qty 1

## 2022-04-11 MED ORDER — INSULIN ASPART 100 UNIT/ML IJ SOLN: 0.0000 [IU] | INTRAMUSCULAR | Status: DC

## 2022-04-11 MED ORDER — ACETAMINOPHEN 325 MG PO TABS: 650.0000 mg | ORAL_TABLET | Freq: Four times a day (QID) | ORAL | Status: DC | PRN

## 2022-04-11 MED ORDER — ONDANSETRON HCL 4 MG PO TABS: 4.0000 mg | ORAL_TABLET | Freq: Four times a day (QID) | ORAL | Status: DC | PRN

## 2022-04-11 MED ORDER — LORAZEPAM 2 MG/ML IJ SOLN
1.0000 mg | INTRAMUSCULAR | Status: DC | PRN
  Administered 2022-04-11 – 2022-04-12 (×2): 3 mg via INTRAVENOUS
  Administered 2022-04-12 – 2022-04-13 (×3): 2 mg via INTRAVENOUS
  Filled 2022-04-11: qty 2
  Filled 2022-04-11 (×2): qty 1

## 2022-04-11 MED ORDER — SODIUM CHLORIDE 0.9 % IV BOLUS
1000.0000 mL | Freq: Once | INTRAVENOUS | Status: AC
  Administered 2022-04-11: 1000 mL via INTRAVENOUS

## 2022-04-11 MED ORDER — HYDROXYZINE HCL 10 MG PO TABS
10.0000 mg | ORAL_TABLET | Freq: Three times a day (TID) | ORAL | Status: DC | PRN
  Administered 2022-04-12: 10 mg via ORAL
  Filled 2022-04-11: qty 1

## 2022-04-11 MED ORDER — LORAZEPAM 1 MG PO TABS: 0.0000 mg | ORAL_TABLET | Freq: Two times a day (BID) | ORAL | Status: DC

## 2022-04-11 MED ORDER — POTASSIUM CHLORIDE 10 MEQ/100ML IV SOLN
10.0000 meq | INTRAVENOUS | Status: AC
  Administered 2022-04-11 (×4): 10 meq via INTRAVENOUS
  Filled 2022-04-11 (×4): qty 100

## 2022-04-11 MED ORDER — THIAMINE HCL 100 MG/ML IJ SOLN
100.0000 mg | Freq: Every day | INTRAMUSCULAR | Status: DC
  Administered 2022-04-12: 100 mg via INTRAVENOUS
  Filled 2022-04-11 (×2): qty 2

## 2022-04-11 MED ORDER — LORAZEPAM 2 MG/ML IJ SOLN
1.0000 mg | Freq: Once | INTRAMUSCULAR | Status: AC
  Administered 2022-04-11: 1 mg via INTRAVENOUS
  Filled 2022-04-11: qty 1

## 2022-04-11 MED ORDER — IOHEXOL 350 MG/ML SOLN
75.0000 mL | Freq: Once | INTRAVENOUS | Status: AC | PRN
  Administered 2022-04-11: 75 mL via INTRAVENOUS

## 2022-04-11 MED ORDER — CHLORDIAZEPOXIDE HCL 5 MG PO CAPS
25.0000 mg | ORAL_CAPSULE | Freq: Four times a day (QID) | ORAL | Status: AC
  Administered 2022-04-11 (×3): 25 mg via ORAL
  Filled 2022-04-11 (×2): qty 5
  Filled 2022-04-11 (×2): qty 1

## 2022-04-11 MED ORDER — SODIUM CHLORIDE 0.9 % IV SOLN: INTRAVENOUS | Status: DC

## 2022-04-11 MED ORDER — LORAZEPAM 1 MG PO TABS: 0.0000 mg | ORAL_TABLET | Freq: Four times a day (QID) | ORAL | Status: DC

## 2022-04-11 MED ORDER — AMLODIPINE BESYLATE 5 MG PO TABS
5.0000 mg | ORAL_TABLET | Freq: Every day | ORAL | Status: DC
  Administered 2022-04-11 – 2022-04-15 (×4): 5 mg via ORAL
  Filled 2022-04-11 (×4): qty 1

## 2022-04-11 MED ORDER — THIAMINE MONONITRATE 100 MG PO TABS
100.0000 mg | ORAL_TABLET | Freq: Every day | ORAL | Status: DC
  Administered 2022-04-11 – 2022-04-13 (×2): 100 mg via ORAL
  Filled 2022-04-11 (×2): qty 1

## 2022-04-11 MED ORDER — CHLORDIAZEPOXIDE HCL 5 MG PO CAPS: 25.0000 mg | ORAL_CAPSULE | Freq: Every day | ORAL | Status: DC

## 2022-04-11 MED ORDER — PANTOPRAZOLE SODIUM 40 MG PO TBEC
40.0000 mg | DELAYED_RELEASE_TABLET | Freq: Two times a day (BID) | ORAL | Status: DC
  Administered 2022-04-11 – 2022-04-15 (×5): 40 mg via ORAL
  Filled 2022-04-11 (×6): qty 1

## 2022-04-11 MED ORDER — LORAZEPAM 1 MG PO TABS
1.0000 mg | ORAL_TABLET | ORAL | Status: DC | PRN
  Filled 2022-04-11: qty 1

## 2022-04-11 MED ORDER — LOPERAMIDE HCL 2 MG PO CAPS: 2.0000 mg | ORAL_CAPSULE | ORAL | Status: DC | PRN

## 2022-04-11 MED ORDER — POTASSIUM CHLORIDE 10 MEQ/100ML IV SOLN
10.0000 meq | INTRAVENOUS | Status: AC
  Administered 2022-04-11 (×2): 10 meq via INTRAVENOUS
  Filled 2022-04-11 (×2): qty 100

## 2022-04-11 MED ORDER — ONDANSETRON HCL 4 MG/2ML IJ SOLN: 4.0000 mg | Freq: Four times a day (QID) | INTRAMUSCULAR | Status: DC | PRN

## 2022-04-11 MED ORDER — ACETAMINOPHEN 650 MG RE SUPP: 650.0000 mg | Freq: Four times a day (QID) | RECTAL | Status: DC | PRN

## 2022-04-11 MED ORDER — LORAZEPAM 2 MG/ML IJ SOLN
0.0000 mg | Freq: Three times a day (TID) | INTRAMUSCULAR | Status: DC
  Filled 2022-04-11: qty 1

## 2022-04-11 MED ORDER — ENOXAPARIN SODIUM 40 MG/0.4ML IJ SOSY
40.0000 mg | PREFILLED_SYRINGE | INTRAMUSCULAR | Status: DC
  Administered 2022-04-11 – 2022-04-14 (×4): 40 mg via SUBCUTANEOUS
  Filled 2022-04-11 (×4): qty 0.4

## 2022-04-11 MED ORDER — LOPERAMIDE HCL 2 MG PO CAPS
4.0000 mg | ORAL_CAPSULE | Freq: Once | ORAL | Status: AC
  Administered 2022-04-11: 4 mg via ORAL
  Filled 2022-04-11: qty 2

## 2022-04-11 MED ORDER — ADULT MULTIVITAMIN W/MINERALS CH
1.0000 | ORAL_TABLET | Freq: Every day | ORAL | Status: DC
  Administered 2022-04-11 – 2022-04-15 (×3): 1 via ORAL
  Filled 2022-04-11 (×4): qty 1

## 2022-04-11 MED ORDER — CHLORDIAZEPOXIDE HCL 5 MG PO CAPS
25.0000 mg | ORAL_CAPSULE | Freq: Three times a day (TID) | ORAL | Status: DC
  Administered 2022-04-12 (×2): 25 mg via ORAL
  Filled 2022-04-11 (×3): qty 5

## 2022-04-11 MED ORDER — SODIUM CHLORIDE 0.9 % IV SOLN
1.0000 g | Freq: Once | INTRAVENOUS | Status: AC
  Administered 2022-04-11: 1 g via INTRAVENOUS
  Filled 2022-04-11: qty 10

## 2022-04-11 MED ORDER — FOLIC ACID 1 MG PO TABS
1.0000 mg | ORAL_TABLET | Freq: Every day | ORAL | Status: DC
  Administered 2022-04-11 – 2022-04-15 (×3): 1 mg via ORAL
  Filled 2022-04-11 (×4): qty 1

## 2022-04-11 NOTE — ED Notes (Signed)
ED TO INPATIENT HANDOFF REPORT  ED Nurse Name and Phone #: Altha Harm [phone]  S Name/Age/Gender Samuel Blevins 57 y.o. male Room/Bed: 031C/031C  Code Status   Code Status: Full Code  Home/SNF/Other Home Patient oriented to: self, place, and situation Is this baseline? Yes   Triage Complete: Triage complete  Chief Complaint Alcohol withdrawal [facility]) [F10.939]  T[facility] Note [facility]; pt noted to having seizure lasting about 2 mins. Pt was post-ictal when ems arrived. Pt has hx of seizures but does not take medication.    Allergies No Known Allergies  Level of Care/Admitting Diagnosis ED Disposition     ED Disposition  Admit   Condition  --   Comment  Hospital Area: [facility] [[id number]]  Level of Care: Progressive [102]  Admit to Progressive based on following criteria: MULTISYSTEM THREATS such as stable sepsis, metabolic/electrolyte imbalance with or without encephalopathy that is responding to early treatment.  Admit to Progressive based on following criteria: ACUTE MENTAL DISORDER-RELATED Drug/Alcohol Ingestion/Overdose/Withdrawal, Suicidal Ideation/attempt requiring safety sitter and < Q2h monitoring/assessments, moderate to severe agitation that is managed with medication/sitter, CIWA-Ar score < 20.  May admit patient to [facility] or [facility] if equivalent level of care is available:: No  Covid Evaluation: Asymptomatic - no recent exposure (last 10 days) testing not required  Diagnosis: Alcohol withdrawal ([place]) [291.81.ICD-9-CM]  Admitting Physician: Aldine Contes [id number]  Attending Physician: Aldine Contes [id number]  Certification:: I certify this patient will need inpatient services for at least 2 midnights  Estimated Length of Stay: 3          B Medical/Surgery History Past Medical History:  Diagnosis Date   Alcohol withdrawal syndrome with complication ([place])    Alcoholism ([place])    Elevated AST  (SGOT)    Gastrointestinal hemorrhage    Homeless    Hypertension    Pancreatic insufficiency    takes Creon   Symptomatic anemia 11/23/2015   Thrombocytopenia ([place]) 05/09/2021   Past Surgical History:  Procedure Laterality Date   BIOPSY  06/23/2019   Procedure: BIOPSY;  Surgeon: Lavena Bullion, DO;  Location: [facility] ENDOSCOPY;  Service: Gastroenterology;;   BIOPSY  12/12/2020   Procedure: BIOPSY;  Surgeon: Jackquline Denmark, MD;  Location: [facility] ENDOSCOPY;  Service: Endoscopy;;   BIOPSY  08/04/2021   Procedure: BIOPSY;  Surgeon: Daryel November, MD;  Location: [facility] ENDOSCOPY;  Service: Gastroenterology;;   ESOPHAGOGASTRODUODENOSCOPY N/A 08/04/2021   Procedure: ESOPHAGOGASTRODUODENOSCOPY (EGD);  Surgeon: Daryel November, MD;  Location: [facility];  Service: Gastroenterology;  Laterality: N/A;   ESOPHAGOGASTRODUODENOSCOPY (EGD) WITH PROPOFOL N/A 06/23/2019   Procedure: ESOPHAGOGASTRODUODENOSCOPY (EGD) WITH PROPOFOL;  Surgeon: Lavena Bullion, DO;  Location: [facility];  Service: Gastroenterology;  Laterality: N/A;   ESOPHAGOGASTRODUODENOSCOPY (EGD) WITH PROPOFOL N/A 12/12/2020   Procedure: ESOPHAGOGASTRODUODENOSCOPY (EGD) WITH PROPOFOL;  Surgeon: Jackquline Denmark, MD;  Location: [facility] ENDOSCOPY;  Service: Endoscopy;  Laterality: N/A;   HOT HEMOSTASIS N/A 06/23/2019   Procedure: HOT HEMOSTASIS (ARGON PLASMA COAGULATION/BICAP);  Surgeon: Lavena Bullion, DO;  Location: [facility] ENDOSCOPY;  Service: Gastroenterology;  Laterality: N/A;     A IV Location/Drains/Wounds Patient Lines/Drains/Airways Status     Active Line/Drains/Airways     Name Placement date Placement time Site Days   Peripheral IV 04/11/22 18 G Left Wrist 04/11/22  0430  Wrist  less than 1            Intake/Output Last 24 hours No intake or output data in the 24  hours ending 04/11/22 1348  Labs/Imaging Results for orders placed or performed during the hospital encounter of 04/11/22 (from the past 48 hour(s))  Resp  panel by RT-PCR (RSV, Flu A&B, Covid) Anterior Nasal Swab     Status: None   Collection Time: 04/11/22  4:30 AM   Specimen: Anterior Nasal Swab  Result Value Ref Range   SARS Coronavirus 2 by RT PCR NEGATIVE NEGATIVE   Influenza A by PCR NEGATIVE NEGATIVE   Influenza B by PCR NEGATIVE NEGATIVE    Comment: (NOTE) The Xpert Xpress SARS-CoV-2/FLU/RSV plus assay is intended as an aid in the diagnosis of influenza from Nasopharyngeal swab specimens and should not be used as a sole basis for treatment. Nasal washings and aspirates are unacceptable for Xpert Xpress SARS-CoV-2/FLU/RSV testing.  Fact Sheet for Patients: EntrepreneurPulse.com.au  Fact Sheet for Healthcare Providers: IncredibleEmployment.be  This test is not yet approved or cleared by the [place] FDA and has been authorized for detection and/or diagnosis of SARS-CoV-2 by FDA under an Emergency Use Authorization (EUA). This EUA will remain in effect (meaning this test can be used) for the duration of the COVID-19 declaration under Section 564(b)(1) of the Act, 21 U.S.C. section 360bbb-3(b)(1), unless the authorization is terminated or revoked.     Resp Syncytial Virus by PCR NEGATIVE NEGATIVE    Comment: (NOTE) Fact Sheet for Patients: EntrepreneurPulse.com.au  Fact Sheet for Healthcare Providers: IncredibleEmployment.be  This test is not yet approved or cleared by the [place] FDA and has been authorized for detection and/or diagnosis of SARS-CoV-2 by FDA under an Emergency Use Authorization (EUA). This EUA will remain in effect (meaning this test can be used) for the duration of the COVID-19 declaration under Section 564(b)(1) of the Act, 21 U.S.C. section 360bbb-3(b)(1), unless the authorization is terminated or revoked.  Performed at [facility], [place] [address] [place], [place] [address]   Comprehensive metabolic panel      Status: Abnormal   Collection Time: 04/11/22  4:55 AM  Result Value Ref Range   Sodium 134 (L) 135 - 145 mmol/L   Potassium 3.1 (L) 3.5 - 5.1 mmol/L   Chloride 94 (L) 98 - 111 mmol/L   CO2 22 22 - 32 mmol/L   Glucose, Bld 92 70 - 99 mg/dL    Comment: Glucose reference range applies only to samples taken after fasting for at least 8 hours.   BUN 5 (L) 6 - 20 mg/dL   Creatinine, Ser 0.60 (L) 0.61 - 1.24 mg/dL   Calcium 8.4 (L) 8.9 - 10.3 mg/dL   Total Protein 7.6 6.5 - 8.1 g/dL   Albumin 3.0 (L) 3.5 - 5.0 g/dL   AST 168 (H) 15 - 41 U/L   ALT 41 0 - 44 U/L   Alkaline Phosphatase 132 (H) 38 - 126 U/L   Total Bilirubin 1.0 0.3 - 1.2 mg/dL   GFR, Estimated >60 >60 mL/min    Comment: (NOTE) Calculated using the CKD-EPI Creatinine Equation (2021)    Anion gap 18 (H) 5 - 15    Comment: Performed at [facility], [place] [address] [place], [place] [address]  CBC with Differential     Status: Abnormal   Collection Time: 04/11/22  4:55 AM  Result Value Ref Range   WBC 5.0 4.0 - 10.5 K/uL   RBC 3.54 (L) 4.22 - 5.81 MIL/uL   Hemoglobin 11.4 (L) 13.0 - 17.0 g/dL   HCT 32.4 (L) 39.0 - 52.0 %  MCV 91.5 80.0 - 100.0 fL   MCH 32.2 26.0 - 34.0 pg   MCHC 35.2 30.0 - 36.0 g/dL   RDW 16.6 (H) 11.5 - 15.5 %   Platelets 146 (L) 150 - 400 K/uL   nRBC 0.0 0.0 - 0.2 %   Neutrophils Relative % 80 %   Neutro Abs 4.1 1.7 - 7.7 K/uL   Lymphocytes Relative 10 %   Lymphs Abs 0.5 (L) 0.7 - 4.0 K/uL   Monocytes Relative 8 %   Monocytes Absolute 0.4 0.1 - 1.0 K/uL   Eosinophils Relative 0 %   Eosinophils Absolute 0.0 0.0 - 0.5 K/uL   Basophils Relative 1 %   Basophils Absolute 0.0 0.0 - 0.1 K/uL   Immature Granulocytes 1 %   Abs Immature Granulocytes 0.03 0.00 - 0.07 K/uL    Comment: Performed at [facility] [address] [place], [place] [address]  Lipase, blood     Status: Abnormal   Collection Time: 04/11/22  4:55 AM  Result Value Ref Range   Lipase 214 (H) 11 - 51 U/L    Comment:  Performed at [facility], [place] [address] [place], [place] [address]  Ethanol     Status: Abnormal   Collection Time: 04/11/22  4:55 AM  Result Value Ref Range   Alcohol, Ethyl (B) 85 (H) <10 mg/dL    Comment: (NOTE) Lowest detectable limit for serum alcohol is 10 mg/dL.  For medical purposes only. Performed at [facility], [place] [address], [place], [place] [address]   Brain natriuretic peptide     Status: None   Collection Time: 04/11/22  4:55 AM  Result Value Ref Range   B Natriuretic Peptide 20.9 0.0 - 100.0 pg/mL    Comment: Performed at [facility] [address] [place], [place] [address]  Troponin I (High Sensitivity)     Status: None   Collection Time: 04/11/22  4:55 AM  Result Value Ref Range   Troponin I (High Sensitivity) 17 <18 ng/L    Comment: (NOTE) Elevated high sensitivity troponin I (hsTnI) values and significant  changes across serial measurements may suggest ACS but many other  chronic and acute conditions are known to elevate hsTnI results.  Refer to the "Links" section for chest pain algorithms and additional  guidance. Performed at [facility], [place] [address] [place], [place] [address]   Lactic acid, plasma     Status: Abnormal   Collection Time: 04/11/22  4:55 AM  Result Value Ref Range   Lactic Acid, Venous 4.2 (HH) 0.5 - 1.9 mmol/L    Comment: CRITICAL RESULT CALLED TO, READ BACK BY AND VERIFIED WITH [facility] YOUNG RN 04/11/22 0636 Wiliam Ke Performed at [facility], [place] [address], [place], [place] [address]   Lactic acid, plasma     Status: None   Collection Time: 04/11/22  6:53 AM  Result Value Ref Range   Lactic Acid, Venous 1.6 0.5 - 1.9 mmol/L    Comment: Performed at [facility] [address] [place], [place] [address]  Troponin I (High Sensitivity)     Status: Abnormal   Collection Time: 04/11/22  6:53 AM  Result Value Ref Range   Troponin I (High Sensitivity) 20 (H) <18 ng/L    Comment:  (NOTE) Elevated high sensitivity troponin I (hsTnI) values and significant  changes across serial measurements may suggest ACS but many other  chronic and acute  conditions are known to elevate hsTnI results.  Refer to the "Links" section for chest pain algorithms and additional  guidance. Performed at [facility], [place] [address] [place], [place] [address]   Magnesium     Status: Abnormal   Collection Time: 04/11/22  6:53 AM  Result Value Ref Range   Magnesium 1.1 (L) 1.7 - 2.4 mg/dL    Comment: HEMOLYSIS AT THIS LEVEL MAY AFFECT RESULT Performed at [facility] [address], [place], [place] [address]   Urine rapid drug screen (hosp performed)     Status: None   Collection Time: 04/11/22  7:00 AM  Result Value Ref Range   Opiates NONE DETECTED NONE DETECTED   Cocaine NONE DETECTED NONE DETECTED   Benzodiazepines NONE DETECTED NONE DETECTED   Amphetamines NONE DETECTED NONE DETECTED   Tetrahydrocannabinol NONE DETECTED NONE DETECTED   Barbiturates NONE DETECTED NONE DETECTED    Comment: (NOTE) DRUG SCREEN FOR MEDICAL PURPOSES ONLY.  IF CONFIRMATION IS NEEDED FOR ANY PURPOSE, NOTIFY LAB WITHIN 5 DAYS.  LOWEST DETECTABLE LIMITS FOR URINE DRUG SCREEN Drug Class                     Cutoff (ng/mL) Amphetamine and metabolites    1000 Barbiturate and metabolites    200 Benzodiazepine                 200 Opiates and metabolites        300 Cocaine and metabolites        300 THC                            50 Performed at [facility], [place] [address] [place], [place] [address]   Urinalysis, Routine w reflex microscopic -Urine, Clean Catch     Status: Abnormal   Collection Time: 04/11/22  7:00 AM  Result Value Ref Range   Color, Urine YELLOW YELLOW   APPearance CLEAR CLEAR   Specific Gravity, Urine 1.026 1.005 - 1.030   pH 6.0 5.0 - 8.0   Glucose, UA NEGATIVE NEGATIVE mg/dL   Hgb urine dipstick SMALL (A) NEGATIVE   Bilirubin Urine NEGATIVE NEGATIVE    Ketones, ur 5 (A) NEGATIVE mg/dL   Protein, ur NEGATIVE NEGATIVE mg/dL   Nitrite POSITIVE (A) NEGATIVE   Leukocytes,Ua NEGATIVE NEGATIVE   RBC / HPF 0-5 0 - 5 RBC/hpf   WBC, UA 0-5 0 - 5 WBC/hpf   Bacteria, UA MANY (A) NONE SEEN   Squamous Epithelial / HPF 0-5 0 - 5 /HPF   Mucus PRESENT     Comment: Performed at [facility], [address], [place], [place] [address]   CT Angio Chest PE W and/or Wo Contrast  Result Date: 04/11/2022 CLINICAL DATA:  Acute, nonlocalized abdominal pain EXAM: CT ANGIOGRAPHY CHEST CT ABDOMEN AND PELVIS WITH CONTRAST TECHNIQUE: Multidetector CT imaging of the chest was performed using the standard protocol during bolus administration of intravenous contrast. Multiplanar CT image reconstructions and MIPs were obtained to evaluate the vascular anatomy. Multidetector CT imaging of the abdomen and pelvis was performed using the standard protocol during bolus administration of intravenous contrast. RADIATION DOSE REDUCTION: This exam was performed according to the departmental dose-optimization program which includes automated exposure control, adjustment of the mA and/or kV according to patient size and/or use of iterative reconstruction technique. CONTRAST:  47m OMNIPAQUE IOHEXOL 350 MG/ML SOLN COMPARISON:  None Available. FINDINGS: CTA CHEST FINDINGS Cardiovascular: Satisfactory opacification of the pulmonary arteries to the segmental level. No evidence of pulmonary embolism. Normal heart size. No pericardial effusion. Scattered atheromatous calcification of the aorta. Mediastinum/Nodes: Negative for adenopathy or mass. Lungs/Pleura: Generalized airway thickening. Some mucoid impaction in the right lower lobe with linear opacity and volume loss. Clustered nodules in the right upper lobe which has an inflammatory appearance. Musculoskeletal: No acute or aggressive finding. Review of the MIP images confirms the above findings. CT ABDOMEN and PELVIS FINDINGS Hepatobiliary:  Hepatic steatosis.No evidence of biliary obstruction or stone. Pancreas: Expansion and adjacent fat stranding centered at the head. No collection or necrosis. No ductal dilatation or discrete mass Spleen: Unremarkable. Adrenals/Urinary Tract: Negative adrenals. No hydronephrosis or stone. Unremarkable bladder. Stomach/Bowel: Fat inflammation around the duodenal C loop, especially medially. Punctate bubble of extraluminal gas at the third portion is attributed to diverticulum, stable. No bowel obstruction. Vascular/Lymphatic: No acute vascular abnormality. Generalized atheromatous plaque. No mass or adenopathy. Reproductive:Enlarged central prostate projecting into the bladder base. Other: No ascites or pneumoperitoneum. Musculoskeletal: No acute abnormalities. L4-5 and L5-S1 degeneration. Review of the MIP images confirms the above findings. IMPRESSION: Abdominal CT: 1. Inflammation at the pancreaticoduodenal groove which could be acute edematous pancreatitis or peptic ulcer disease. No collection. 2. Severe hepatic steatosis. Chest CTA: 1. Negative for pulmonary embolism. 2. Mucoid impaction in the right lower lobe with mild atelectasis. Mild airspace disease in the right upper lobe. Electronically Signed   By: Jorje Guild M.D.   On: 04/11/2022 07:05   CT ABDOMEN PELVIS W CONTRAST  Result Date: 04/11/2022 CLINICAL DATA:  Acute, nonlocalized abdominal pain EXAM: CT ANGIOGRAPHY CHEST CT ABDOMEN AND PELVIS WITH CONTRAST TECHNIQUE: Multidetector CT imaging of the chest was performed using the standard protocol during bolus administration of intravenous contrast. Multiplanar CT image reconstructions and MIPs were obtained to evaluate the vascular anatomy. Multidetector CT imaging of the abdomen and pelvis was performed using the standard protocol during bolus administration of intravenous contrast. RADIATION DOSE REDUCTION: This exam was performed according to the departmental dose-optimization program which  includes automated exposure control, adjustment of the mA and/or kV according to patient size and/or use of iterative reconstruction technique. CONTRAST:  78m OMNIPAQUE IOHEXOL 350 MG/ML SOLN COMPARISON:  None Available. FINDINGS: CTA CHEST FINDINGS Cardiovascular: Satisfactory opacification of the pulmonary arteries to the segmental level. No evidence of pulmonary embolism. Normal heart size. No pericardial effusion. Scattered atheromatous calcification of the aorta. Mediastinum/Nodes: Negative for adenopathy or mass. Lungs/Pleura: Generalized airway thickening. Some mucoid impaction in the right lower lobe with linear opacity and volume loss. Clustered nodules in the right upper lobe which has an inflammatory appearance. Musculoskeletal: No acute or aggressive finding. Review of the MIP images confirms the above findings. CT ABDOMEN and PELVIS FINDINGS Hepatobiliary: Hepatic steatosis.No evidence of biliary obstruction or stone. Pancreas: Expansion and adjacent fat stranding centered at the head. No collection or necrosis. No ductal dilatation or discrete mass Spleen: Unremarkable. Adrenals/Urinary Tract: Negative adrenals. No hydronephrosis or stone. Unremarkable bladder. Stomach/Bowel: Fat inflammation around the duodenal C loop, especially medially. Punctate bubble of extraluminal gas at the third portion is attributed to diverticulum, stable. No bowel obstruction. Vascular/Lymphatic: No acute vascular abnormality. Generalized atheromatous plaque. No mass or adenopathy. Reproductive:Enlarged central prostate projecting into the bladder base. Other: No ascites or pneumoperitoneum. Musculoskeletal: No acute abnormalities. L4-5 and L5-S1 degeneration. Review of the MIP images confirms the above findings. IMPRESSION: Abdominal CT: 1. Inflammation at the pancreaticoduodenal groove  which could be acute edematous pancreatitis or peptic ulcer disease. No collection. 2. Severe hepatic steatosis. Chest CTA: 1. Negative  for pulmonary embolism. 2. Mucoid impaction in the right lower lobe with mild atelectasis. Mild airspace disease in the right upper lobe. Electronically Signed   By: Jorje Guild M.D.   On: 04/11/2022 07:05   CT Head Wo Contrast  Result Date: 04/11/2022 CLINICAL DATA:  Head trauma, moderate to severe. EXAM: CT HEAD WITHOUT CONTRAST CT CERVICAL SPINE WITHOUT CONTRAST TECHNIQUE: Multidetector CT imaging of the head and cervical spine was performed following the standard protocol without intravenous contrast. Multiplanar CT image reconstructions of the cervical spine were also generated. RADIATION DOSE REDUCTION: This exam was performed according to the departmental dose-optimization program which includes automated exposure control, adjustment of the mA and/or kV according to patient size and/or use of iterative reconstruction technique. COMPARISON:  01/30/2022 FINDINGS: CT HEAD FINDINGS Brain: No evidence of acute infarction, hemorrhage, hydrocephalus, extra-axial collection or mass lesion/mass effect. Vascular: No hyperdense vessel or unexpected calcification. Skull: Normal. Negative for fracture or focal lesion. Sinuses/Orbits: No acute finding. CT CERVICAL SPINE FINDINGS Alignment: No traumatic malalignment. Mild degenerative retrolisthesis at C3-4 and C4-5. Skull base and vertebrae: No acute fracture. No primary bone lesion or focal pathologic process. Soft tissues and spinal canal: No prevertebral fluid or swelling. No visible canal hematoma. Disc levels: Disc narrowing with endplate and uncovertebral ridging at C3-4 and C4-5 causing biforaminal stenosis. Upper chest: Negative IMPRESSION: No evidence of acute intracranial or cervical spine injury. Electronically Signed   By: Jorje Guild M.D.   On: 04/11/2022 06:57   CT Cervical Spine Wo Contrast  Result Date: 04/11/2022 CLINICAL DATA:  Head trauma, moderate to severe. EXAM: CT HEAD WITHOUT CONTRAST CT CERVICAL SPINE WITHOUT CONTRAST TECHNIQUE:  Multidetector CT imaging of the head and cervical spine was performed following the standard protocol without intravenous contrast. Multiplanar CT image reconstructions of the cervical spine were also generated. RADIATION DOSE REDUCTION: This exam was performed according to the departmental dose-optimization program which includes automated exposure control, adjustment of the mA and/or kV according to patient size and/or use of iterative reconstruction technique. COMPARISON:  01/30/2022 FINDINGS: CT HEAD FINDINGS Brain: No evidence of acute infarction, hemorrhage, hydrocephalus, extra-axial collection or mass lesion/mass effect. Vascular: No hyperdense vessel or unexpected calcification. Skull: Normal. Negative for fracture or focal lesion. Sinuses/Orbits: No acute finding. CT CERVICAL SPINE FINDINGS Alignment: No traumatic malalignment. Mild degenerative retrolisthesis at C3-4 and C4-5. Skull base and vertebrae: No acute fracture. No primary bone lesion or focal pathologic process. Soft tissues and spinal canal: No prevertebral fluid or swelling. No visible canal hematoma. Disc levels: Disc narrowing with endplate and uncovertebral ridging at C3-4 and C4-5 causing biforaminal stenosis. Upper chest: Negative IMPRESSION: No evidence of acute intracranial or cervical spine injury. Electronically Signed   By: Jorje Guild M.D.   On: 04/11/2022 06:57   DG Chest Portable 1 View  Result Date: 04/11/2022 CLINICAL DATA:  Short of breath EXAM: PORTABLE CHEST 1 VIEW COMPARISON:  Prior chest x-ray 12/06/2021 FINDINGS: The heart size and mediastinal contours are within normal limits. Atherosclerotic calcifications present in the transverse aorta. Both lungs are clear save for mild chronic bronchitic changes. The visualized skeletal structures are unremarkable. IMPRESSION: No active disease. Electronically Signed   By: Jacqulynn Cadet M.D.   On: 04/11/2022 05:28    Pending Labs Unresulted Labs (From admission,  onward)    None       Vitals/Pain Today's  Vitals   04/11/22 1230 04/11/22 1236 04/11/22 1300 04/11/22 1340  BP: (!) 153/99  (!) 167/97 (!) 152/98  Pulse: (!) 106  (!) 108 (!) 119  Resp: (!) 24  (!) 24 20  Temp:  98 F (36.7 C)    TempSrc:  Oral    SpO2: 95%  96% 97%  PainSc:        Isolation Precautions Airborne and Contact precautions  Medications Medications  thiamine (VITAMIN B1) tablet 100 mg (100 mg Oral Given 04/11/22 1000)    Or  thiamine (VITAMIN B1) injection 100 mg ( Intravenous See Alternative 04/11/22 1000)  enoxaparin (LOVENOX) injection 40 mg (40 mg Subcutaneous Given 04/11/22 1137)  acetaminophen (TYLENOL) tablet 650 mg (has no administration in time range)    Or  acetaminophen (TYLENOL) suppository 650 mg (has no administration in time range)  senna-docusate (Senokot-S) tablet 1 tablet (has no administration in time range)  ondansetron (ZOFRAN) tablet 4 mg (has no administration in time range)    Or  ondansetron (ZOFRAN) injection 4 mg (has no administration in time range)  LORazepam (ATIVAN) tablet 1-4 mg ( Oral See Alternative 04/11/22 1129)    Or  LORazepam (ATIVAN) injection 1-4 mg (3 mg Intravenous Given [id number])  folic acid (FOLVITE) tablet 1 mg (1 mg Oral Given 04/11/22 1137)  multivitamin with minerals tablet 1 tablet (1 tablet Oral Given 04/11/22 1138)  LORazepam (ATIVAN) injection 0-4 mg ( Intravenous Not Given 04/11/22 1133)    Followed by  LORazepam (ATIVAN) injection 0-4 mg (has no administration in time range)  0.9 %  sodium chloride infusion ( Intravenous New Bag/Given 04/11/22 1136)  hydrOXYzine (ATARAX) tablet 10 mg (has no administration in time range)  loperamide (IMODIUM) capsule 2-4 mg (has no administration in time range)  chlordiazePOXIDE (LIBRIUM) capsule 25 mg (25 mg Oral Not Given 04/11/22 1343)    Followed by  chlordiazePOXIDE (LIBRIUM) capsule 25 mg (has no administration in time range)    Followed by  chlordiazePOXIDE  (LIBRIUM) capsule 25 mg (has no administration in time range)    Followed by  chlordiazePOXIDE (LIBRIUM) capsule 25 mg (has no administration in time range)  amLODipine (NORVASC) tablet 5 mg (5 mg Oral Given 04/11/22 1337)  lipase/protease/amylase (CREON) capsule 12,000 Units (has no administration in time range)  pantoprazole (PROTONIX) EC tablet 40 mg (has no administration in time range)  sucralfate (CARAFATE) tablet 1 g (has no administration in time range)  LORazepam (ATIVAN) injection 1 mg (1 mg Intravenous Given 04/11/22 0530)  sodium chloride 0.9 % bolus 1,000 mL (0 mLs Intravenous Stopped 04/11/22 0732)  iohexol (OMNIPAQUE) 350 MG/ML injection 75 mL (75 mLs Intravenous Contrast Given 04/11/22 0645)  potassium chloride 10 mEq in 100 mL IVPB (0 mEq Intravenous Stopped 04/11/22 0958)  sodium chloride 0.9 % bolus 1,000 mL (0 mLs Intravenous Stopped 04/11/22 1106)  cefTRIAXone (ROCEPHIN) 1 g in sodium chloride 0.9 % 100 mL IVPB (0 g Intravenous Stopped 04/11/22 1044)  loperamide (IMODIUM) capsule 4 mg (4 mg Oral Given 04/11/22 1000)  magnesium sulfate IVPB 2 g 50 mL (0 g Intravenous Stopped 04/11/22 1312)    Mobility walks with device     Focused Assessments Neuro Assessment Handoff:  Swallow screen pass?  N/a Cardiac Rhythm: Sinus tachycardia       Neuro Assessment: Exceptions to [facility] Neuro Checks:      Has TPA been given? No If patient is a Neuro Trauma and patient is going to OR before floor call report  to [place] nurse: [phone] or [phone]   R Recommendations: See Admitting Provider Note  Report given to:   Additional Notes: Pt needs male [place].

## 2022-04-11 NOTE — Progress Notes (Signed)
EEG complete - results pending 

## 2022-04-11 NOTE — ED Notes (Signed)
Pt found soaked in urine, again.  Pt attempting to use urinal, but seems to be missing or accidentally dumping it on himself.  Full linen change completed.

## 2022-04-11 NOTE — ED Notes (Addendum)
2nd dose of Librium not given d/t administration time being too close to 1st dose.  2 doses of Librium pulled from Pyxis d/t 1st dose being dropped in the floor.

## 2022-04-11 NOTE — H&P (Signed)
Date: 04/11/2022               Samuel Blevins Name:  Samuel Blevins MRN: [id number]  DOB: 11/02/1965 Age / Sex: 57 y.o., male   PCP: [name], MD         Medical Service: Internal Medicine Teaching Service         Attending Physician: Dr. Aldine Contes, MD    First Contact: Dr. Gaylyn Rong, MD Pager: [phone]  Second Contact: Dr. Virl Axe, MD Pager: [phone]       After Hours (After 5p/  First Contact Pager: [phone]  weekends / holidays): Second Contact Pager: [phone]   Chief Complaint: Seizure  History of Present Illness: Samuel Blevins is a 57 yo male with PMH alcohol use disorder, chronic pancreatitis with pancreatic insufficiency, pancreatic pseudocyst, hypertension, chronic anemia and thrombocytopenia, and homelessness who presented to the ED via EMS after a seizure. Samuel Blevins's seizure was witnessed at the shelter, lasted about 2 minutes and he was post testicle on arrival to the ED. Samuel Blevins reports he was sitting on a bench outside the shelter drinking when he suddenly fell off the chair started shaking.  He does not remember biting his tongue or urinating on himself but remembers when EMS arrived.  Reports being confused after the episode.  He endorses chronic abdominal pain from his pancreatitis.  He also reports occasional nausea and vomiting as well as watery stools 2-3 times a day. States he has vomited x 2 today, has mild headache and feels cold but denies any shortness of breath, cough, dysuria, chest pain, palpitations, or fevers.    ED course: Vitals on arrival: Tachycardic to 118, BP 148/85, RR 19 and SpO2 86%.  Placed on 3 L [place]. Labs shows neg RVP, Na 134, K+ 3.1, mag 1.1, sCr 0.6, albumin 3.0, AST/ALT 168/41, alk phos 132, WBC 5.0, Hgb 11.4, PLT 146, lipase 214, ethanol 85, BNP 20.9, troponin 17->20, lactic acid 4.2->1.6, UDS negative, UA shows mild ketonuria, positive nitrate, neg leuks and many bacteria. CTH neg, CTA negative for PE, CT C-spine negative  for cervical spine injury, CT A/P shows acute edematous pancreatitis and severe hepatic steatosis.  Samuel Blevins was started on CIWA with Ativan. IMTS consulted for admission.  Meds:  No outpatient medications have been marked as taking for the 04/11/22 encounter [facility] Encounter).    Allergies: Allergies as of 04/11/2022   (No Known Allergies)   Past Medical History:  Diagnosis Date   Alcohol withdrawal syndrome with complication (HCC)    Alcoholism ([place])    Elevated AST (SGOT)    Gastrointestinal hemorrhage    Homeless    Hypertension    Pancreatic insufficiency    takes Creon   Symptomatic anemia 11/23/2015   Thrombocytopenia ([place]) 05/09/2021    Family History:  Family History  Problem Relation Age of Onset   Diabetes Mellitus II Neg Hx    Colon cancer Neg Hx    Stomach cancer Neg Hx    Pancreatic cancer Neg Hx     Social History:  Samuel Blevins currently stays at the [facility].  States he drinks a pint (375 cc) of liquor daily. Last drink on 2/14.  He has been smoking half a pack of cigarettes for the past 20 years.  He denies any illicit drug use.  His PCP is Dr. Joya Gaskins at [facility].  Review of Systems: A complete ROS was negative except as per HPI.   Physical Exam: Blood pressure 139/82, pulse Marland Kitchen)  120, temperature 98 F (36.7 C), temperature source Oral, resp. rate 18, SpO2 98 %.  General: Tremulous middle-age man laying in bed. No acute distress. HEENT: Bilateral cataracts, L>R. Poor dentition. Small abrasion noted underneath the left side of tongue. Mild tongue fasciculations. PERRLA. Dry mucous membrane. CV: Tachycardic. Regular rhythm. No murmurs, rubs, or gallops. No LE edema Pulmonary: Lungs CTAB. Normal effort. No wheezing or rales. Abdominal: Soft. Mild diffuse abdominal tenderness. No rebound or guarding. Normal bowel sounds. Extremities: Radial and DP pulses 2+ and symmetric. Skin: Warm and dry. No obvious rash or lesions. Neuro: A&Ox3. Moves all extremities.  Abnormal finger-nose testing. Normal sensation to gross touch. Strength 5/5 in all extremities. Psych: Normal mood and affect  EKG: personally reviewed my interpretation is sinus tach with occasional PVCs.  CXR: personally reviewed my interpretation is no acute cardiopulmonary disease  Assessment & Plan by Problem: Principal Problem:   Alcohol withdrawal [facility])  Samuel Blevins is a 57 yo male with PMH alcohol use disorder, chronic pancreatitis with pancreatic insufficiency, pancreatic pseudocyst, hypertension, chronic anemia and thrombocytopenia, and homelessness who presented after a witnessed seizure and admitted for alcohol withdrawal.  #Alcohol withdrawal #Alcohol withdrawal Seizure Samuel Blevins has a history of severe alcohol use disorder with multiple hospitalizations for alcohol induced pancreatitis and alcohol withdrawal. He presented after a witnessed seizure at his shelter. He reports history of seizures related to his alcohol use and when he cuts down stops drinking but unsure when his last seizure was.  Samuel Blevins found to be tachycardic, hypertensive, tremulous, diaphoretic on admission. CIWA score 11 overnight, up to 17 this morning. Ethanol level elevated to 85 but significantly lower than levels in the past. Samuel Blevins reports drinking only 1/2 of his pint size bottle when he starting having the seizure. CT head negative. UDS negative. Samuel Blevins's seizure likely secondary to alcohol withdrawal and electrolyte abnormalities. During his recent hospitalization for hematemesis in December 2023, Samuel Blevins developed alcohol withdrawal that required escalation up to Precedex drip as well as phenobarb taper. Will continue Samuel Blevins on CIWA protocol with Ativan and had a Librium taper to help decrease need for as needed Ativan. -Admit to progressive bed -CIWA with Ativan -Librium taper -Follow-up EEG -Trend and replete electrolytes -Adjuvant therapy with PRN hydroxyzine, loperamide and  Zofran -Thiamine, multivitamin and folate -Seizure and fall precautions -TOC consult for substance use resources  #Acute on chronic alcoholic pancreatitis #Pancreatic insufficiency Samuel Blevins has chronic pancreatitis and pancreatic sufficiency from constant alcohol abuse. He has baseline abdominal pain as well as occasional nausea and vomiting. Lipase elevated to 214 and CT A/P shows evidence of acute edematous pancreatitis versus PUD. Samuel Blevins reports some mild nausea and abdominal discomfort that is unchanged. He has generalized mild tenderness to palpation on exam. S/p 2 L IV NS. Will continue IV fluid hydration and advance diet as tolerated. -Advance diet as tolerated -IV NS 125 cc/h -Resume Creon 12,000 units 3 times daily before meals  #GERD #Chronic gastritis  Complications of his chronic alcohol abuse. Past EGDs have shown gastritis, 3 cm hiatal hernia, duodenal erosion and duodenal polyp. Biopsy from last EGD in June 2023 was negative for dysplasia, carcinoma and H. pylori but showed gastric metaplasia consistent with peptic duodenitis.  Samuel Blevins's gastritis likely has not improved due to his constant alcohol use. -Resume Protonix 40 mg twice daily -Resume sucralfate 1 g 3 times daily with meals and at bedtime  #Hypomagnesemia #Hypokalemia On admission, Samuel Blevins found to have magnesium 1.1 and K+ of 3.1 in the setting of  chronic alcohol use. S/p mag sulfate 2 g IV and IV KCl 10 mEq x 2 hours. -Give additional mag sulfate 2 g IV -KCl 10 mEq x 4 hrs -Trend and replete electrolytes  #Asymptomatic bacteriuria On admission, urinalysis showed mild ketonuria, positive nitrate, neg leuks and many bacteria.  Samuel Blevins denies any dysuria, burning with urination or hematuria. S/p 1 dose of Rocephin in the ED.  Will hold off on further antibiotics as Samuel Blevins is asymptomatic. -CTM  #HTN Samuel Blevins found to be hypertensive with SBP in the 140s to 150s on admission in the setting of alcohol  withdrawal. -Resume amlodipine 5 mg daily  #Pancytopenia #Thrombocytopenia Suspected due to bone marrow suppression from chronic alcohol use. Slight rise in white count from 2-3 in the pat to 5 during this hospitalization. Hemoglobin of 11.4, stable compared to baseline of 11-12. No evidence of active GI bleed. -Daily CBC  CODE STATUS: Full code DIET: Advance as tolerated PPx: Lovenox  Dispo: Admit Samuel Blevins to Inpatient with expected length of stay greater than 2 midnights.  Signed: Lacinda Axon, MD 04/11/2022, 11:38 AM  Pager: [phone] Internal Medicine Teaching Service After 5pm on weekdays and 1pm on weekends: On Call pager: [phone]

## 2022-04-11 NOTE — ED Notes (Signed)
Pt has had multiple loose stools.  Full linen change completed.

## 2022-04-11 NOTE — ED Notes (Signed)
Pts tongue presents with bite marks.

## 2022-04-11 NOTE — ED Triage Notes (Signed)
[facility]; pt noted to having seizure lasting about 2 mins. Pt was post-ictal when ems arrived. Pt has hx of seizures but does not take medication.

## 2022-04-11 NOTE — Procedures (Signed)
Patient Name: Samuel Blevins  MRN: [id number]  Epilepsy Attending: Lora Havens  Referring Physician/Provider: Lacinda Axon, MD  Date: 05/09/2021 Duration: 22.09 mins  Patient history: 57 y.o. presenting to the ED after having a seizure. EEG to evaluate for seizure  Level of alertness: Awake  AEDs during EEG study: Librium, Ativan  Technical aspects: This EEG study was done with scalp electrodes positioned according to the 10-20 International system of electrode placement. Electrical activity was reviewed with band pass filter of 1-70Hz$ , sensitivity of 7 uV/mm, display speed of 5m/sec with a 60Hz$  notched filter applied as appropriate. EEG data were recorded continuously and digitally stored.  Video monitoring was available and reviewed as appropriate.  Description: The posterior dominant rhythm consists of 8-9 Hz activity of moderate voltage (25-35 uV) seen predominantly in posterior head regions, symmetric and reactive to eye opening and eye closing. There is an excessive amount of 15 to 18 Hz beta activity distributed symmetrically and diffusely. Hyperventilation and photic stimulation were not performed.     Of note, parts of study were difficult due to significant myogenic artifact.   ABNORMALITY - Excessive beta, generalized  IMPRESSION: This technically difficult study is within normal limits. The excessive beta activity seen in the background is most likely due to the effect of benzodiazepine and is a benign EEG pattern. No seizures or epileptiform discharges were seen throughout the recording.  Starlet Gallentine OBarbra Sarks

## 2022-04-11 NOTE — ED Provider Notes (Signed)
Accepted handoff at shift change from W. Ileene Patrick PA-C. Please see prior provider note for more detail.   Briefly: Patient is 57 y.o. presenting to the ED after having a seizure.  He is not currently on any seizure medications.  Also presents with some abdominal pain without nausea, vomiting, or diarrhea.  Patient drinks approximately a pint of liquor daily with last drink being day prior.    DDX: concern for intracranial mass or hemorrhage, epilepsy, alcohol withdrawal  Plan: Hospital admission due to unknown hypoxia, continue treatment of pancreatitis and alcohol withdrawal.  Awaiting CT results, will treat accordingly.     Results for orders placed or performed during the hospital encounter of 04/11/22  Resp panel by RT-PCR (RSV, Flu A&B, Covid) Anterior Nasal Swab   Specimen: Anterior Nasal Swab  Result Value Ref Range   SARS Coronavirus 2 by RT PCR NEGATIVE NEGATIVE   Influenza A by PCR NEGATIVE NEGATIVE   Influenza B by PCR NEGATIVE NEGATIVE   Resp Syncytial Virus by PCR NEGATIVE NEGATIVE  Comprehensive metabolic panel  Result Value Ref Range   Sodium 134 (L) 135 - 145 mmol/L   Potassium 3.1 (L) 3.5 - 5.1 mmol/L   Chloride 94 (L) 98 - 111 mmol/L   CO2 22 22 - 32 mmol/L   Glucose, Bld 92 70 - 99 mg/dL   BUN 5 (L) 6 - 20 mg/dL   Creatinine, Ser 0.60 (L) 0.61 - 1.24 mg/dL   Calcium 8.4 (L) 8.9 - 10.3 mg/dL   Total Protein 7.6 6.5 - 8.1 g/dL   Albumin 3.0 (L) 3.5 - 5.0 g/dL   AST 168 (H) 15 - 41 U/L   ALT 41 0 - 44 U/L   Alkaline Phosphatase 132 (H) 38 - 126 U/L   Total Bilirubin 1.0 0.3 - 1.2 mg/dL   GFR, Estimated >60 >60 mL/min   Anion gap 18 (H) 5 - 15  CBC with Differential  Result Value Ref Range   WBC 5.0 4.0 - 10.5 K/uL   RBC 3.54 (L) 4.22 - 5.81 MIL/uL   Hemoglobin 11.4 (L) 13.0 - 17.0 g/dL   HCT 32.4 (L) 39.0 - 52.0 %   MCV 91.5 80.0 - 100.0 fL   MCH 32.2 26.0 - 34.0 pg   MCHC 35.2 30.0 - 36.0 g/dL   RDW 16.6 (H) 11.5 - 15.5 %   Platelets 146 (L) 150 - 400  K/uL   nRBC 0.0 0.0 - 0.2 %   Neutrophils Relative % 80 %   Neutro Abs 4.1 1.7 - 7.7 K/uL   Lymphocytes Relative 10 %   Lymphs Abs 0.5 (L) 0.7 - 4.0 K/uL   Monocytes Relative 8 %   Monocytes Absolute 0.4 0.1 - 1.0 K/uL   Eosinophils Relative 0 %   Eosinophils Absolute 0.0 0.0 - 0.5 K/uL   Basophils Relative 1 %   Basophils Absolute 0.0 0.0 - 0.1 K/uL   Immature Granulocytes 1 %   Abs Immature Granulocytes 0.03 0.00 - 0.07 K/uL  Lipase, blood  Result Value Ref Range   Lipase 214 (H) 11 - 51 U/L  Ethanol  Result Value Ref Range   Alcohol, Ethyl (B) 85 (H) <10 mg/dL  Lactic acid, plasma  Result Value Ref Range   Lactic Acid, Venous 4.2 (HH) 0.5 - 1.9 mmol/L  Troponin I (High Sensitivity)  Result Value Ref Range   Troponin I (High Sensitivity) 17 <18 ng/L   CT Head Wo Contrast  Result Date: 04/11/2022 CLINICAL DATA:  Head trauma, moderate to severe. EXAM: CT HEAD WITHOUT CONTRAST CT CERVICAL SPINE WITHOUT CONTRAST TECHNIQUE: Multidetector CT imaging of the head and cervical spine was performed following the standard protocol without intravenous contrast. Multiplanar CT image reconstructions of the cervical spine were also generated. RADIATION DOSE REDUCTION: This exam was performed according to the departmental dose-optimization program which includes automated exposure control, adjustment of the mA and/or kV according to patient size and/or use of iterative reconstruction technique. COMPARISON:  01/30/2022 FINDINGS: CT HEAD FINDINGS Brain: No evidence of acute infarction, hemorrhage, hydrocephalus, extra-axial collection or mass lesion/mass effect. Vascular: No hyperdense vessel or unexpected calcification. Skull: Normal. Negative for fracture or focal lesion. Sinuses/Orbits: No acute finding. CT CERVICAL SPINE FINDINGS Alignment: No traumatic malalignment. Mild degenerative retrolisthesis at C3-4 and C4-5. Skull base and vertebrae: No acute fracture. No primary bone lesion or focal  pathologic process. Soft tissues and spinal canal: No prevertebral fluid or swelling. No visible canal hematoma. Disc levels: Disc narrowing with endplate and uncovertebral ridging at C3-4 and C4-5 causing biforaminal stenosis. Upper chest: Negative IMPRESSION: No evidence of acute intracranial or cervical spine injury. Electronically Signed   By: Jorje Guild M.D.   On: 04/11/2022 06:57   CT Cervical Spine Wo Contrast  Result Date: 04/11/2022 CLINICAL DATA:  Head trauma, moderate to severe. EXAM: CT HEAD WITHOUT CONTRAST CT CERVICAL SPINE WITHOUT CONTRAST TECHNIQUE: Multidetector CT imaging of the head and cervical spine was performed following the standard protocol without intravenous contrast. Multiplanar CT image reconstructions of the cervical spine were also generated. RADIATION DOSE REDUCTION: This exam was performed according to the departmental dose-optimization program which includes automated exposure control, adjustment of the mA and/or kV according to patient size and/or use of iterative reconstruction technique. COMPARISON:  01/30/2022 FINDINGS: CT HEAD FINDINGS Brain: No evidence of acute infarction, hemorrhage, hydrocephalus, extra-axial collection or mass lesion/mass effect. Vascular: No hyperdense vessel or unexpected calcification. Skull: Normal. Negative for fracture or focal lesion. Sinuses/Orbits: No acute finding. CT CERVICAL SPINE FINDINGS Alignment: No traumatic malalignment. Mild degenerative retrolisthesis at C3-4 and C4-5. Skull base and vertebrae: No acute fracture. No primary bone lesion or focal pathologic process. Soft tissues and spinal canal: No prevertebral fluid or swelling. No visible canal hematoma. Disc levels: Disc narrowing with endplate and uncovertebral ridging at C3-4 and C4-5 causing biforaminal stenosis. Upper chest: Negative IMPRESSION: No evidence of acute intracranial or cervical spine injury. Electronically Signed   By: Jorje Guild M.D.   On: 04/11/2022  06:57   DG Chest Portable 1 View  Result Date: 04/11/2022 CLINICAL DATA:  Short of breath EXAM: PORTABLE CHEST 1 VIEW COMPARISON:  Prior chest x-ray 12/06/2021 FINDINGS: The heart size and mediastinal contours are within normal limits. Atherosclerotic calcifications present in the transverse aorta. Both lungs are clear save for mild chronic bronchitic changes. The visualized skeletal structures are unremarkable. IMPRESSION: No active disease. Electronically Signed   By: Jacqulynn Cadet M.D.   On: 04/11/2022 05:28    Physical Exam  BP (!) 162/96   Pulse (!) 116   Temp 98.9 F (37.2 C) (Rectal)   Resp 13   SpO2 100%   Physical Exam  Procedures  Procedures  ED Course / MDM    Medical Decision Making Amount and/or Complexity of Data Reviewed Labs: ordered. Radiology: ordered.  Risk OTC drugs. Prescription drug management.   Patient presents to the ED with concerns of seizure, abdominal pain, and possible alcohol withdrawal.  Patient has been placed on CIWA protocol with  improvement in his symptoms.  CT of head, c-spine, and chest without acute abnormality or evidence of PE.  CT abdomen with evidence of inflammation around pancreaticoduodenal groove which correlates with acute pancreatitis and severe hepatic steatosis.  Lactic acid 4.2, likely related to patient having seizure as repeat lactic is 1.6.  Will continue to give fluids and correct electrolytes.  UA with evidence of infection, will give 1 g IV Rocephin to treat.    Patient having several episodes of loose stools.  Loperamide ordered for patient to help slow diarrhea.    I requested consultation with on-call provider to discuss hospital admission for new hypoxia, alcoholic pancreatitis, seizure and alcohol withdrawal.  Internal medicine provider agreed with hospital admission.            Pat Kocher, [place] 04/11/22 1003    Lacretia Leigh, MD 04/12/22 [phone]

## 2022-04-11 NOTE — ED Provider Notes (Signed)
[facility] Provider Note   CSN: [id number] Arrival date & time: 04/11/22  0401     History  Chief Complaint  Patient presents with   Seizures    Samuel Blevins is a 57 y.o. male.  HPI   Medical history including alcohol dependency, chronic pancreatitis, pancreatic pseudocyst, hypertension, thrombocytopenia presents after having a seizure.  Patient states that has not had a seizure in a while, he is currently not on any seizure medications, he states he felt fine prior to the seizure, states he has a slight headache now but denies change in vision paresthesias or weakness the upper lower extremities.  He notes that he has had some cough and congestion for last couple states is nonproductive, also notes he has some slight abdominal pain but denies any nausea vomiting diarrhea denies any urinary symptoms.  He states that he drinks about a pint of liquor daily, states he last drank yesterday.   Reviewed patient's chart has been seen in the past for hematemesis, he had upper endoscopy negative for evidence of GI bleed, unfortunately went to withdrawals and was placed on Precedex.    Home Medications Prior to Admission medications   Medication Sig Start Date End Date Taking? Authorizing Provider  acetaminophen (TYLENOL) 325 MG tablet Take 2 tablets (650 mg total) by mouth every 6 (six) hours as needed for fever, moderate pain, mild pain or headache. Patient not taking: Reported on 02/14/2022 12/12/20   Riesa Pope, MD  amLODipine (NORVASC) 5 MG tablet Take 1 tablet (5 mg total) by mouth daily. Patient not taking: Reported on 02/14/2022 12/27/21   [name], MD  clobetasol cream (TEMOVATE) [id number] % Apply 1 Application topically 2 (two) times daily. 03/06/22   [name], MD  folic acid (FOLVITE) 1 MG tablet Take 1 tablet (1 mg total) by mouth daily. 10/17/21   [name], MD  Iron, Ferrous Sulfate, 325 (65 Fe) MG TABS  Take 1 tablet ( 325 mg) by mouth daily. 10/17/21   [name], MD  lipase/protease/amylase (CREON) 12000-38000 units CPEP capsule Take 1 capsule (12,000 Units total) by mouth 3 (three) times daily before meals. 12/27/21   [name], MD  Multiple Vitamin (MULTIVITAMIN WITH MINERALS) TABS tablet Take 1 tablet by mouth daily. 09/27/21   [name], MD  ofloxacin (OCUFLOX) 0.3 % ophthalmic solution Place 1 drop into both eyes 4 (four) times daily. 03/20/22   [name], MD  ondansetron (ZOFRAN) 8 MG tablet Take 1 tablet (8 mg total) by mouth every 6 (six) hours as needed for nausea. 01/23/22   [name], MD  pantoprazole (PROTONIX) 40 MG tablet Take 1 tablet (40 mg total) by mouth 2 (two) times daily before a meal. 12/27/21   [name], MD  polyethylene glycol (MIRALAX / GLYCOLAX) 17 g packet Take 17 g by mouth daily as needed for mild constipation or moderate constipation. 10/07/21   Johny Blamer, DO  senna-docusate (SENOKOT-S) 8.6-50 MG tablet Take 1 tablet by mouth at bedtime as needed for mild constipation. 10/07/21   Johny Blamer, DO  sucralfate (CARAFATE) 1 g tablet Take 1 tablet (1 g total) by mouth 4 (four) times daily -  with meals and at bedtime. 12/27/21   [name], MD  thiamine (VITAMIN B1) 100 MG tablet Take 1 tablet (100 mg total) by mouth daily. 10/02/21   [name], MD      Allergies  Patient has no known allergies.    Review of Systems   Review of Systems  Constitutional:  Negative for chills and fever.  Respiratory:  Negative for shortness of breath.   Cardiovascular:  Negative for chest pain.  Gastrointestinal:  Positive for abdominal pain.  Neurological:  Positive for headaches.    Physical Exam Updated Vital Signs BP (!) 140/90   Pulse (!) 114   Temp 98 F (36.7 C) (Oral)   Resp 17   SpO2 94%  Physical Exam Vitals and nursing note reviewed.  Constitutional:      General: He is not in acute distress.     Appearance: He is not ill-appearing.  HENT:     Head: Normocephalic and atraumatic.     Comments: No raccoon eyes or Battle sign noted.    Nose: No congestion.     Mouth/Throat:     Mouth: Mucous membranes are moist.     Pharynx: Oropharynx is clear.     Comments: No trismus no torticollis, patient has noted abrasions on his tongue from biting it, tongue uvula both midline controlling oral secretions tonsils both equal symmetric bilaterally.  No submandibular swelling. Eyes:     Extraocular Movements: Extraocular movements intact.     Conjunctiva/sclera: Conjunctivae normal.     Pupils: Pupils are equal, round, and reactive to light.  Cardiovascular:     Rate and Rhythm: Normal rate and regular rhythm.     Pulses: Normal pulses.     Heart sounds: No murmur heard.    No friction rub. No gallop.  Pulmonary:     Effort: No respiratory distress.     Breath sounds: No wheezing, rhonchi or rales.     Comments: During my evaluation patient was hypoxic in the upper 80s, placed on 3 L via nasal cannula O2 sats are now in the mid 90s, he is nontachypneic, no assessor muscle usage, patient has slight Rales present bilaterally, no rhonchi wheezing or stridor present. Abdominal:     Palpations: Abdomen is soft.     Tenderness: There is abdominal tenderness. There is no right CVA tenderness or left CVA tenderness.     Comments: Abdomen nondistended, soft, he has noted tenderness in his epigastric region without guarding rebound tenderness or peritoneal sign.  Musculoskeletal:     Comments: Spine was palpated was nontender to palpation no step-off deformities noted.  No pelvis instability no leg shortening patient is moving his upper and lower extremities out difficulty.  Skin:    General: Skin is warm and dry.  Neurological:     Mental Status: He is alert.     Comments: Cranial nerves II through XII grossly intact no difficulty with word finding following two-step commands there is no unilateral  weakness present.  Psychiatric:        Mood and Affect: Mood normal.     Comments: Patient is alert and orient x 4, he is tremulous during my examination, not endorsing suicidal homicidal ideations denies any hallucinations or delusions.     ED Results / Procedures / Treatments   Labs (all labs ordered are listed, but only abnormal results are displayed) Labs Reviewed  RESP PANEL BY RT-PCR (RSV, FLU A&B, COVID)  [id number]  COMPREHENSIVE METABOLIC PANEL  CBC WITH DIFFERENTIAL/PLATELET  LIPASE, BLOOD  ETHANOL  RAPID URINE DRUG SCREEN, HOSP PERFORMED  URINALYSIS, ROUTINE W REFLEX MICROSCOPIC  BRAIN NATRIURETIC PEPTIDE  LACTIC ACID, PLASMA  LACTIC ACID, PLASMA  TROPONIN I (HIGH SENSITIVITY)    EKG  None  Radiology No results found.  Procedures .Critical Care  Performed by: Marcello Fennel, PA-C Authorized by: Marcello Fennel, PA-C   Critical care provider statement:    Critical care time (minutes):  30   Critical care time was exclusive of:  Separately billable procedures and treating other patients   Critical care was necessary to treat or prevent imminent or life-threatening deterioration of the following conditions:  Respiratory failure   Critical care was time spent personally by me on the following activities:  Discussions with consultants, evaluation of patient's response to treatment, examination of patient, ordering and review of laboratory studies, ordering and review of radiographic studies, ordering and performing treatments and interventions, pulse oximetry, re-evaluation of patient's condition and review of old charts   I assumed direction of critical care for this patient from another provider in my specialty: no       Medications Ordered in ED Medications - No data to display  ED Course/ Medical Decision Making/ A&P                             Medical Decision Making Amount and/or Complexity of Data Reviewed Labs: ordered. Radiology:  ordered.  Risk OTC drugs. Prescription drug management.   This patient presents to the ED for concern of seizures, this involves an extensive number of treatment options, and is a complaint that carries with it a high risk of complications and morbidity.  The differential diagnosis includes intracranial mass, intracranial bleed, epilepsy, withdrawal    Additional history obtained:  Additional history obtained from EMS External records from outside source obtained and reviewed including recent ER notes   Co morbidities that complicate the patient evaluation  Alcohol dependency  Social Determinants of Health:  N/A    Lab Tests:  I Ordered, and personally interpreted labs.  The pertinent results include: CBC shows normocytic anemia hemoglobin 11.4, CMP shows sodium of 134, potassium 3.1, BUN 5 creatinine 0.6 AST 168 alk phos 132 anion gap 18, lipase is 214 ethanol 85, respiratory panel negative   Imaging Studies ordered:  I ordered imaging studies including chest x-ray, CT head, C-spine, CTA chest as well as CT ab pelvis I independently visualized and interpreted imaging which showed chest x-ray was negative, I agree with the radiologist interpretation   Cardiac Monitoring:  The patient was maintained on a cardiac monitor.  I personally viewed and interpreted the cardiac monitored which showed an underlying rhythm of: Sinus tach without signs of ischemia   Medicines ordered and prescription drug management:  I ordered medication including Ativan, fluids I have reviewed the patients home medicines and have made adjustments as needed  Critical Interventions:  Acute hypoxia-started on 3 L via nasal cannula Reassessed the patient O2 sats have remained stable in the mid 90s.   Reevaluation:  Presents after seizures, patient is tremulous on my exam, tachycardic, concern for possible withdrawals at this time, he is also hypoxic, will obtain imaging form of fluids Ativan  and continue to monitor.  Patient was reassessed after Ativan he is no longer tremulous, will continue to monitor.    Consultations Obtained:  N/a    Test Considered:  N/a    Rule out Suspicion for sepsis is low at this time afebrile oral as well as rectal, no leukocytosis, he is normotensive,  Tachycardia has improved after fluids and Ativan provided.  I suspect the initial tachycardia was from withdrawals as he is also  trauma on my exam.  Suspicion for acute MI is low no ST elevation first troponin is 17 second troponin is pending.     Dispostion and problem list  Due to shift change patient be handed off to [employer]  Suspect admission due to unknown hypoxia, continue treatment pancreatitis as well as withdrawals.  Follow-up on CT imaging and treat accordingly.           Final Clinical Impression(s) / ED Diagnoses Final diagnoses:  None    Rx / DC Orders ED Discharge Orders     None         Marcello Fennel, PA-C 04/11/22 [id number]    Palumbo, April, MD 04/11/22 [phone]

## 2022-04-11 NOTE — ED Notes (Signed)
Pt found to be soaked in urine.  Linen change completed and Pt provided w/ another brief.

## 2022-04-11 NOTE — ED Notes (Signed)
Pt transported to CT ?

## 2022-04-11 NOTE — ED Notes (Signed)
EEG at bedside.

## 2022-04-11 NOTE — ED Notes (Signed)
Pt had medium liquid BM

## 2022-04-12 DIAGNOSIS — F10931 Alcohol use, unspecified with withdrawal delirium: Secondary | ICD-10-CM | POA: Diagnosis not present

## 2022-04-12 DIAGNOSIS — R569 Unspecified convulsions: Secondary | ICD-10-CM

## 2022-04-12 DIAGNOSIS — K852 Alcohol induced acute pancreatitis without necrosis or infection: Principal | ICD-10-CM

## 2022-04-12 LAB — CBC
HCT: 33.2 % — ABNORMAL LOW (ref 39.0–52.0)
Hemoglobin: 11.3 g/dL — ABNORMAL LOW (ref 13.0–17.0)
MCH: 31.5 pg (ref 26.0–34.0)
MCHC: 34 g/dL (ref 30.0–36.0)
MCV: 92.5 fL (ref 80.0–100.0)
Platelets: 130 10*3/uL — ABNORMAL LOW (ref 150–400)
RBC: 3.59 MIL/uL — ABNORMAL LOW (ref 4.22–5.81)
RDW: 16.2 % — ABNORMAL HIGH (ref 11.5–15.5)
WBC: 4.8 10*3/uL (ref 4.0–10.5)
nRBC: 0 % (ref 0.0–0.2)

## 2022-04-12 LAB — BASIC METABOLIC PANEL
Anion gap: 14 (ref 5–15)
BUN: <5 mg/dL — ABNORMAL LOW (ref 6–20)
CO2: 25 mmol/L (ref 22–32)
Calcium: 8.5 mg/dL — ABNORMAL LOW (ref 8.9–10.3)
Chloride: 91 mmol/L — ABNORMAL LOW (ref 98–111)
Creatinine, Ser: 0.66 mg/dL (ref 0.61–1.24)
GFR, Estimated: >60 mL/min (ref >60)
Glucose, Bld: 82 mg/dL (ref 70–99)
Potassium: 3.4 mmol/L — ABNORMAL LOW (ref 3.5–5.1)
Sodium: 130 mmol/L — ABNORMAL LOW (ref 135–145)

## 2022-04-12 LAB — GLUCOSE, CAPILLARY
Glucose-Capillary: 78 mg/dL (ref 70–99)
Glucose-Capillary: 81 mg/dL (ref 70–99)
Glucose-Capillary: 77 mg/dL (ref 70–99)
Glucose-Capillary: 73 mg/dL (ref 70–99)
Glucose-Capillary: 66 mg/dL — ABNORMAL LOW (ref 70–99)
Glucose-Capillary: 78 mg/dL (ref 70–99)

## 2022-04-12 LAB — MAGNESIUM: Magnesium: 1.4 mg/dL — ABNORMAL LOW (ref 1.7–2.4)

## 2022-04-12 MED ORDER — MAGNESIUM SULFATE 4 GM/100ML IV SOLN
4.0000 g | Freq: Once | INTRAVENOUS | Status: AC
  Administered 2022-04-12: 4 g via INTRAVENOUS
  Filled 2022-04-12: qty 100

## 2022-04-12 MED ORDER — LACTATED RINGERS IV SOLN: INTRAVENOUS | Status: DC

## 2022-04-12 MED ORDER — POTASSIUM CHLORIDE 10 MEQ/100ML IV SOLN
10.0000 meq | INTRAVENOUS | Status: AC
  Administered 2022-04-12 (×4): 10 meq via INTRAVENOUS
  Filled 2022-04-12 (×4): qty 100

## 2022-04-12 NOTE — Progress Notes (Signed)
Internal Medicine Attending:   I saw and examined the patient. I reviewed the resident's note and I agree with the resident's findings and plan as documented in the resident's note.  In brief, patient is a 57 year old male with a past medical history of alcohol use disorder, chronic pancreatitis, pancreatic pseudocyst, hypertension, chronic anemia and thrombocytopenia who is unhomed who presented to the ED with a witnessed seizure.  History obtained from chart as patient is unable to provide history at this time.  Patient was sitting on a bench outside the shelter drinking when he suddenly fell off the chair and started shaking.  The next thing he remembered was EMS arriving.  Patient was confused after the episode.  Patient did vomit twice yesterday and also complained of a mild headache.  Today, patient is relatively somnolent but states that he did not want to be examined and wanted [place] to leave the room.  Vitals:   04/12/22 0805 04/12/22 1203  BP: 135/89 (!) 157/98  Pulse: (!) 105 (!) 109  Resp: 20 20  Temp: 98.1 F (36.7 C) 97.8 F (36.6 C)  SpO2: 100% 97%   On exam, patient was somnolent but arousable.  He did not want to answer questions and asked [place] to leave the room.  He did not want to be examined.  He did appear agitated and tremulous.  Assessment and plan:  1.  Acute alcohol withdrawal with alcohol withdrawal seizure: -Patient presented to ED with a witnessed seizure in the setting of a history of severe alcohol use disorder with multiple hospitalizations for alcohol abuse pancreatitis and alcohol withdrawal.  Patient alcohol level on admission was 85 mg/dL. -Will continue with Librium taper as well as Ativan with CIWA protocol.  Patient CIWA scores today have been ranging between 10 and 19.  He is requiring multiple doses of Ativan despite the Librium.  I am concerned that he is at high risk for decompensation especially given that he previously has required ICU level care for  withdrawal. -Will continue to monitor closely.  Low threshold to consult PCCM if patient deteriorates -Patient was also noted to be hypomagnesemic likely secondary to his alcohol use.  Will continue to supplement aggressively -Continue with thiamine, multivitamin and folate -Continue with seizure precautions -TOC consult for substance use  2.  Acute on chronic pancreatitis secondary to alcohol use: -Imaging on admission showed acute inflammation at the pancreaticoduodenal groove consistent with acute edematous pancreatitis. -Patient denies any abdominal pain today but this is difficult to ascertain given his somnolence. -Patient is tolerating p.o. intake.  Will continue regular diet for now -Continue with IV hydration with LR at 125 cc/h -Continue with Creon 3 times daily with meals  3.  Transaminitis: -Patient was also noted to have mild transaminitis on admission with an elevated AST of 168 with a normal ALT.  I suspect this likely secondary to alcohol use. -Will continue to monitor closely -No further workup at this time

## 2022-04-12 NOTE — Progress Notes (Signed)
CSW following to ensure patient has resources at discharge though he is not able to participate in conversation at this time. It appears he has been staying at [facility]. Will likely need assistance with transportation at discharge. ETOH and homeless resources placed on AVS.  Gilmore Laroche, MSW, [facility]

## 2022-04-12 NOTE — Hospital Course (Signed)
States he feels fine.

## 2022-04-12 NOTE — Progress Notes (Signed)
Patient HR 120s-130s. Dr. Soyla Murphy and Dr. Dareen Piano aware.  Patient has received ativan based on CIWA scores.  Patient has continual tremors and confused.  No new orders.  Per Dr. Soyla Murphy, if patient becomes too agitated despite ativan, to notify IM team for possible transfer to ICU for precedex.

## 2022-04-12 NOTE — Discharge Instructions (Signed)
[employer] assistance programs Crisis assistance programs   -[facility]eless[facility] Heritage manager. If you are experiencing homelessness in [place], [place], your first point of contact should be Chiropractor. You can reach Coordinated Entry by calling [phone] or by emailing [email or web address].  Community access points: [employer] ([place], [place]) every Tuesday from [place] ([address] [place]) every Wednesday from 8am-9am.   -The [employer] 463-573-1027) offers several services to local families, as funding allows. The Emergency Assistance Program (EAP), which they administer, provides household goods, free food, clothing, and financial aid to people in need in the [facility] area. The EAP program does have some qualification, and counselors will interview clients for financial assistance by written referral only. Referrals need to be made by the Department of Social Services or by other EAP approved human services agencies or charities in the area.  -Open [facility] of [employer], which can be reached at [phone], offers emergency assistance programs for those in need of help, such as food, rent assistance, a soup kitchen, shelter, and clothing. They are based in [facility] but provide a number of services to those that qualify for assistance.   -[place] may be able to offer temporary financial assistance and cash grants for paying rent and utilities, Help may be provided for local county residents who may be experiencing personal crisis when other resources, including government programs, are not available. Call [phone]  -[place] is a [place], The organization can offer emergency assistance for paying rent, [employer], utilities,  food, household products and furniture. They offer extensive emergency and transitional housing for families, children and single women, and also run a 38 and [employer]. Thrift Shops, Financial controller, and other aid offered too. [address], [place], [place], [phone]  -[place] -- This is offered for [facility] families. The federal government created [employer] Program provides a one-time cash grant payment to help eligible low-income families pay their electric and heating bills. [address] [place], [place] [id number], [phone]  -[place] Emergency Assistance -- A program offers emergency utility and rent funds for greater [employer] area residents. The program can also provide counseling and referrals to charities and government programs. Also provides food and a free meal program that serves lunch Mondays - Saturdays and dinner seven days per week to individuals in the community. [address], [place], [place], [phone]  -[place] affordable apartment and housing communities across      [place] and [place]. The low income and seniors can access public housing, rental assistance to qualified applicants, and apply for the section 8 rent subsidy program. Other programs include Visual merchandiser and Barrister's clerk. [address] [employer], [place], dial [phone].  -The [place] provides transitional housing to veterans and the disabled. Clients will also access other services too, including assistance in applying for Disability, life skills classes, case management, and assistance in finding permanent housing. [address], [place], [place], call [phone]  -[place] through [employer] is  for people who were just evicted or that are formerly homeless. The non-profit will also help then gain self-sufficiency, find a home or apartment  to live in, and also provides information on rent assistance when needed. Phone [phone]  -The [place] [employer] helps low income, elderly, or disabled residents in seven counties in the [place] ([place], [place], [place], [place], [place], [place], [place], and [place]) save energy and reduce their utility bills by improving energy efficiency. Phone [phone].  -[employer] is located in the [place] in the [employer], [address], [place] [address] [place], [place], [place] [address]. Parking is in the rear of the building. Phone: [phone]   General Email: info@gsohc$ .org  GHC provides free housing counseling assistance in locating affordable rental housing or housing with support services for families and individuals in crisis and the chronically homeless. We provide potential resources for other housing needs like utilities. Our trained counselors also work with clients on budgeting and financial literacy in effort to empower them to take control of their financial situations. [employer] collaborates with homeless service providers and other stakeholders as part of the [place] ([place]). The ([place]) is a regional/local planning body that coordinates housing and services funding for homeless families and individuals. The role of [place] in the [place] is through housing counseling to work with people we serve on diversion strategies for those that are at imminent risk of becoming homeless. We also work with the Coordinated Assessment/Entry Specialist who attempts to find temporary solutions and/or connects the people to Housing First, Wink or transitional housing programs. [place] Counselors  meet with clients on business days (Monday-Fridays, except scheduled holidays) from 8:30 am to 4:30 pm.  Legal assistance for evictions, foreclosure, and more -If you need free legal advice on civil issues, such as foreclosures, evictions, Mudlogger, government programs, domestic issues and more, Scientist, research (physical sciences) Aid of [facility]) is a Teacher, adult education firm that provides free legal services and counsel to lower income people, seniors, disabled, and others, The goal is to ensure everyone has access to justice and fair representation. Call them at 321-873-4070.  [facility] for Housing and Community Studies can provide info about obtaining legal assistance with evictions. Phone ([phone].  Training [place]. offers job and Database administrator. Resources are focused on helping students obtain the skills and experiences that are necessary to compete in today's challenging and tight job market. The non-profit faith-based community action agency offers internship trainings as well as classroom instruction. Classes are tailored to meet the needs of people in the [facility] region. [place],  [address], [phone]  Foreclosure prevention/Debt Services Family Services of the [place] and foreclosure prevention programs for local families. This includes money management, financial advice, budget review and development of a written action plan with a Higher education careers adviser to help solve specific individual financial problems. In addition, housing and mortgage counselors can also provide pre- and post-purchase homeownership counseling, default resolution counseling (to prevent foreclosure) and reverse mortgage counseling. A Debt Management Program allows people and families with a high level of credit card or medical debt to consolidate and repay consumer debt and  loans to creditors and rebuild positive credit ratings and scores. Contact ([phone]) [phone].  Community clinics in [facility]: [address], [place], [place]. [phone].  -Health Department [place] [facility]: [address], [place], [address]. [phone].  -[place] offers medical care through a group  of doctors, pharmacies and other healthcare related agencies that offer services for low income, uninsured adults in [place]. Also offers adult Dental care and assistance with applying for an [employer]. Call [phone].   -[place]. This center provides low-cost health care to those without health insurance. Services offered include an onsite pharmacy. Phone [phone]. [address], [place], [place].  -Medication Assistance Program serves as a link between pharmaceutical companies and patients to provide low cost or free prescription medications. This service is available for residents who meet certain income restrictions and have no insurance coverage. PLEASE CALL [phone]) [phone] Lady [place]) OR [phone] ([place])  -One Step Further: Merchant navy officer, [employer] Support & Nutrition Program, The [employer]. Call (930)[phone]/ ([phone]) [phone].  Food pantry and assistance -Urban Ministry-Food Bank: [address], [place] [address]. Phone [phone]  -[facility] Food Pantry: [address], [place], [place] [address]. [phone].  -[place]: https://findfood.BidStrong.co.za  FLEEING VIOLENCE:  -Family Services of the [place]- 24/7 Crisis line (732) [phone]) -[place]: (336) 641-SAFE 626-549-6321)   [place] 2-1-1 is another useful way to locate resources in the community. Visit PricingGame.co.uk to find service information online. If you need  additional assistance, 2-1-1 Referral Specialists are available 24 hours a day, every day by dialing 2-1-1 or 346-172-8284 from any phone. The call is free, confidential, and available in any language.  Affordable Housing Search  http://www.nchousingsearch.[facility])   M-F 8a-3p [address], [place] [address] [phone] Services include: laundry, barbering, support groups, case management, phone & computer access, showers, AA/NA mtgs, mental health/substance abuse nurse, job skills class, disability information, VA assistance, spiritual classes, etc. Winter Shelter available when temperatures are less than 32 degrees.   HOMELESS [place] at [facility]- Call [phone] ext. [phone] or ext. [phone]. Located at [address] [place], [place] [address]  Open [place]- Call [phone]. Located at [address] [place].  [place]. Call [phone]. Office located at [address] [place].  Pathways Family Housing through [place] [phone].  [place]- Call [phone]. Located at [place], [place], [place] [address].  Room at the Inn-For Pregnant mothers. Call [phone]. Located at [address] [place], [place].  [place] [facility]-For men in [place]. Call [phone].  Home of [employer] for [employer] [phone]. Office located at [place]. [address], [place], [place].  [employer] be agreeable to help with chores. Call [phone] ext. 5000.  Men's: [address] [place], [place] [address]. Women's: [facility]  [address] [place], [place] [address]  Crisis Services [facility] Mobile Crisis Management- [phone]  [facility] [address], [place], [place] [address]. Phone:  [phone]   In a time of Crisis: Therapeutic Alternatives, inc.  Mobile Crisis Management provides immediate crisis response, 24/7.  Call [phone]  [facility] for MH/DD/SA [facility] is available 24 hours a day, 7 days a week. Customer Service Specialists will assist you to find a crisis provider that is well-matched with your needs. Your local number is: [phone]  [facility]/Behavioral [facility] Urgent Care ([place]) IOP, individual counseling, medication management [place],  [address] [phone]  Call for intake hours; Medicaid and Uninsured    Outpatient Providers  Alcohol and Drug Services (ADS) Group and individual counseling. [address]  [place], [place] [address] [phone] [place]: [phone]  [place]: [phone] Medicaid and uninsured.   The [place] IOP groups multiple times per week. [place], [place], [place] [address] [phone] Takes Medicaid and other insurances.   [facility]  Chemical Dependency Intensive Outpatient Program (IOP) [address] [place], [place] [address] [phone] Takes Pharmacist, community and [place].   [facility]  IOP and Partial Hospitalization Program  [place].  [place], [place] [address] [phone] Private Insurance, [place] only for partial hospitalization  ACDM Assessment and Counseling of [address] [place], [place], [place] [address] [phone] Monday-Friday. Short and Long term options. [facility]/Behavioral Health Urgent Care ([place]) IOP, individual counseling, medication management [place], [place] [address] [phone] Medicaid and [facility]  [place] [address]  [place], [place] [address] [phone] Private Insurance and [facility] [address]  [place], [place] [address] [phone] Private Insurance, [place], and Self Pay   [facility]: Methadone Clinic  [place], [place] [address] [facility]  [address]  [place], [place] [address] [phone]  [facility]aring Services  [address], [place] [address] [phone]      Residential Treatment Programs  [facility] ([place], [place] [address] [phone] or [phone] Detox and Residential Rehab 14 days (Medicare, Medicaid, private insurance, and self pay). No methadone. Call for pre-screen.   [facility]) [place], [place] [address] [phone] Detox (self Pay and Medicaid Limited availability) Rehab Only Male (Medicare, Medicaid, and Self Pay)-No methadone.  Fellowship [address] [place], [place] [address] [phone] or [phone] Private Insurance only  Path of [place], [place] [address] Phone:  [phone] Must be detoxed 72 hours prior to admission; 28 day program.  Self-pay.  [facility] [place], [place] [phone] [employer], Medicare, [place] (not straight [place]). They offer assistance with transportation.   [facility] ([place]) [address] [place],  [place], [place] [address] [phone] Christian Based Program. Men only. No insurance  [facility] is a substance use disorder treatment program for women, including those who are pregnant, parenting, and/or whose lives have been touched by abuse and violence. [phone]) [phone]  [facility] [address] [place], [place] [address] Women's: [phone] Men's: [phone] No Medicaid.   Addiction Centers of [place]. (mainly [place]) willing to help with transportation.  [phone]  [employer]. [address].  [place], [place] [address] [phone] Treatment Only, must make assessment appointment, and must be sober for assessment appointment. Self pay, Medicare A and B, [address], must be [facility] resident. No methadone.   [place], [place] [address] [phone] No pending legal charges, Long-term work program. No methadone. Call for assessment.  [facility]  [address], [place], [place] [address] [phone] or [phone] [place] [phone] Private Insurance (  no Medicaid). Males/Females, call to make referrals, multiple facilities   [facility] [address]  [place], [place] [address]  [phone] Men Only Upfront Fee  [facility]-no methadone [facility] [address] [place], [place] [address] [phone] (f)

## 2022-04-12 NOTE — Progress Notes (Signed)
Subjective:   Summary: Samuel Blevins is a 57 y.o. year old male currently admitted on the IMTS HD#1 for alcoholic pancreatitis.  Overnight Events: CIWAs ~11 ON on librium taper. Continues to receive ativan on top based on CIWA scores. EEG shows no seizure like activity. Reported pain scores 0 ON. No reported emesis  Awake but not very interactive during time of our assessment.  Seems agitated and mumbling responses.  No significant change in neurostatus, not participating in exam at this time.  Objective:  Vital signs in last 24 hours: Vitals:   04/12/22 0617 04/12/22 0804 04/12/22 0805 04/12/22 1203  BP:  135/89 135/89 (!) 157/98  Pulse: 89 (!) 111 (!) 105 (!) 109  Resp:  20 20 20  $ Temp:  98.1 F (36.7 C) 98.1 F (36.7 C) 97.8 F (36.6 C)  TempSrc:  Oral Oral Oral  SpO2:  100% 100% 97%  Weight:      Height:       Supplemental O2: Room Air    Physical Exam:  Not participating full exam at this time  Constitutional: Tremulous, appears agitated Cardiovascular: Deferred Pulmonary/Chest: Deferred Abdominal: Deferred Skin: warm and dry Extremities: upper/lower extremity pulses 2+, no lower extremity edema present Neuro: Awake, mumbling speech, appears agitated and tremulous and moving all extremities spontaneously.  Unable to assess orientation or completed other aspects of neuroexam   Filed Weights   04/11/22 1521  Weight: 76 kg     Intake/Output Summary (Last 24 hours) at 04/12/2022 1527 Last data filed at 04/12/2022 0319 Gross per 24 hour  Intake 121.9 ml  Output 1650 ml  Net -1528.1 ml   Net IO Since Admission: -1,528.1 mL [04/12/22 1527]  Pertinent Labs:    Latest Ref Rng & Units 04/12/2022    6:42 AM 04/11/2022    4:55 AM 02/14/2022   10:24 AM  CBC  WBC 4.0 - 10.5 K/uL 4.8  5.0  4.7   Hemoglobin 13.0 - 17.0 g/dL 11.3  11.4  12.8   Hematocrit 39.0 - 52.0 % 33.2  32.4  37.7   Platelets 150 - 400 K/uL 130  146  237        Latest  Ref Rng & Units 04/12/2022    6:42 AM 04/11/2022    4:55 AM 02/14/2022   10:24 AM  CMP  Glucose 70 - 99 mg/dL 82  92  81   BUN 6 - 20 mg/dL 5  5  5   $ Creatinine 0.61 - 1.24 mg/dL 0.66  0.60  0.63   Sodium 135 - 145 mmol/L 130  134  139   Potassium 3.5 - 5.1 mmol/L 3.4  3.1  3.9   [place]ide 98 - 111 mmol/L 91  94  96   CO2 22 - 32 mmol/L 25  22  23   $ Calcium 8.9 - 10.3 mg/dL 8.5  8.4  9.6   Total Protein 6.5 - 8.1 g/dL  7.6  8.7   Total Bilirubin 0.3 - 1.2 mg/dL  1.0  0.9   Alkaline Phos 38 - 126 U/L  132  119   AST 15 - 41 U/L  168  155   ALT 0 - 44 U/L  41  56     Imaging: No results found.   EKG: My EKG interpretation is as follows: sinus tachy with PVCs  Assessment/Plan:   Principal Problem:   Alcohol withdrawal ([place])  Patient Summary: [name] is a 57 yo male with PMH alcohol use disorder, chronic pancreatitis with pancreatic insufficiency, pancreatic pseudocyst, hypertension, chronic anemia and thrombocytopenia, and homelessness who presented after a witnessed seizure and admitted for alcohol withdrawal and acute on chronic pancreatitis   #Alcohol withdrawal #Alcohol withdrawal Seizure Patient has a history of severe alcohol use disorder with multiple hospitalizations for alcohol induced pancreatitis and alcohol withdrawal and has previously required ICU level care. He presented after a witnessed seizure at his shelter. He reports history of seizures related to his alcohol use and when he cuts down stops drinking but unsure when his last seizure was.  Last drink was sometime between 24 and 48 hours ago.  CIWAs 11 on librium taper, received 32m ativan total yesterday and ON.  EEG was negative for seizure-like activity.  Appears tremulous and agitated during assessment today.  He is not participating in full exam at this time.  Without sedation from Librium and Ativan, he would likely be significantly worse.  He is at high risk for further decompensation, low  threshold to escalate to ICU if needed. -Admit to progressive bed -CIWA with Ativan -Librium taper -Trend and replete electrolytes -Adjuvant therapy with PRN hydroxyzine, loperamide and Zofran -Thiamine, multivitamin and folate -Seizure and fall precautions -TOC consult for substance use resources   #Acute on chronic alcoholic pancreatitis #Pancreatic insufficiency Initial workup shows acute on chronic alcoholic pancreatitis.  He has minimal pain reported, though difficult to ascertain with unclear communication from him.  Diet was already advanced to regular.  Seems to be tolerating some p.o. intake.  Will continue covering with IV hydration as well for now. -Advance diet as tolerated -IV LR 125 cc/h -Resume Creon 12,000 units 3 times daily before meals   #GERD #Chronic gastritis  Complications of his chronic alcohol abuse. Past EGDs have shown gastritis, 3 cm hiatal hernia, duodenal erosion and duodenal polyp. Biopsy from last EGD in June 2023 was negative for dysplasia, carcinoma and H. pylori but showed gastric metaplasia consistent with peptic duodenitis.  Patient's gastritis likely has not improved due to his constant alcohol use. -Resume Protonix 40 mg twice daily -Resume sucralfate 1 g 3 times daily with meals and at bedtime   #Hypomagnesemia #Hypokalemia Will replete electrolytes as needed -Trend and replete electrolytes   #Asymptomatic bacteriuria On admission, urinalysis showed mild ketonuria, positive nitrate, neg leuks and many bacteria.  Patient denies any dysuria, burning with urination or hematuria. S/p 1 dose of Rocephin in the ED.  Will hold off on further antibiotics as patient is asymptomatic. -CTM   #HTN Patient found to be hypertensive with SBP in the 140s to 150s on admission in the setting of alcohol withdrawal. -Resume amlodipine 5 mg daily   #Pancytopenia #Thrombocytopenia Suspected due to bone marrow suppression from chronic alcohol use.  No evidence  of active GI bleed. -Daily CBC  Diet: Normal IVF: NS,125cc/hr VTE: Enoxaparin Code: Full PT/OT recs: Pending TOC recs: Pending, substance use, homelessness Family Update:   Dispo: Anticipated discharge tpending clinical improvement   SLinus Galas MD PGY-1 Internal Medicine Resident Please contact the on call pager after 5 pm and on weekends at [phone]

## 2022-04-13 DIAGNOSIS — F10932 Alcohol use, unspecified with withdrawal with perceptual disturbance: Secondary | ICD-10-CM | POA: Diagnosis not present

## 2022-04-13 DIAGNOSIS — K852 Alcohol induced acute pancreatitis without necrosis or infection: Secondary | ICD-10-CM | POA: Diagnosis not present

## 2022-04-13 DIAGNOSIS — F10931 Alcohol use, unspecified with withdrawal delirium: Secondary | ICD-10-CM | POA: Diagnosis not present

## 2022-04-13 LAB — CBC
HCT: 33 % — ABNORMAL LOW (ref 39.0–52.0)
Hemoglobin: 10.9 g/dL — ABNORMAL LOW (ref 13.0–17.0)
MCH: 31.1 pg (ref 26.0–34.0)
MCHC: 33 g/dL (ref 30.0–36.0)
MCV: 94 fL (ref 80.0–100.0)
Platelets: 129 10*3/uL — ABNORMAL LOW (ref 150–400)
RBC: 3.51 MIL/uL — ABNORMAL LOW (ref 4.22–5.81)
RDW: 15.9 % — ABNORMAL HIGH (ref 11.5–15.5)
WBC: 4.7 10*3/uL (ref 4.0–10.5)
nRBC: 0 % (ref 0.0–0.2)

## 2022-04-13 LAB — COMPREHENSIVE METABOLIC PANEL
ALT: 28 U/L (ref 0–44)
AST: 86 U/L — ABNORMAL HIGH (ref 15–41)
Albumin: 2.8 g/dL — ABNORMAL LOW (ref 3.5–5.0)
Alkaline Phosphatase: 117 U/L (ref 38–126)
Anion gap: 17 — ABNORMAL HIGH (ref 5–15)
BUN: <5 mg/dL — ABNORMAL LOW (ref 6–20)
CO2: 18 mmol/L — ABNORMAL LOW (ref 22–32)
Calcium: 8.7 mg/dL — ABNORMAL LOW (ref 8.9–10.3)
Chloride: 95 mmol/L — ABNORMAL LOW (ref 98–111)
Creatinine, Ser: 0.68 mg/dL (ref 0.61–1.24)
GFR, Estimated: >60 mL/min (ref >60)
Glucose, Bld: 65 mg/dL — ABNORMAL LOW (ref 70–99)
Potassium: 3.4 mmol/L — ABNORMAL LOW (ref 3.5–5.1)
Sodium: 130 mmol/L — ABNORMAL LOW (ref 135–145)
Total Bilirubin: 1.8 mg/dL — ABNORMAL HIGH (ref 0.3–1.2)
Total Protein: 7.4 g/dL (ref 6.5–8.1)

## 2022-04-13 LAB — GLUCOSE, CAPILLARY
Glucose-Capillary: 75 mg/dL (ref 70–99)
Glucose-Capillary: 76 mg/dL (ref 70–99)
Glucose-Capillary: 82 mg/dL (ref 70–99)
Glucose-Capillary: 88 mg/dL (ref 70–99)

## 2022-04-13 LAB — MAGNESIUM: Magnesium: 1.7 mg/dL (ref 1.7–2.4)

## 2022-04-13 LAB — AMMONIA: Ammonia: 26 umol/L (ref 9–35)

## 2022-04-13 MED ORDER — PHENOBARBITAL SODIUM 130 MG/ML IJ SOLN: 65.0000 mg | Freq: Three times a day (TID) | INTRAMUSCULAR | Status: DC

## 2022-04-13 MED ORDER — LACTATED RINGERS IV SOLN: INTRAVENOUS | Status: DC

## 2022-04-13 MED ORDER — LORAZEPAM 2 MG/ML IJ SOLN
0.0000 mg | INTRAMUSCULAR | Status: DC
  Administered 2022-04-13 (×3): 2 mg via INTRAVENOUS
  Administered 2022-04-14: 1 mg via INTRAVENOUS
  Administered 2022-04-14: 2 mg via INTRAVENOUS
  Administered 2022-04-14: 3 mg via INTRAVENOUS
  Administered 2022-04-14 – 2022-04-15 (×4): 2 mg via INTRAVENOUS
  Administered 2022-04-15: 1 mg via INTRAVENOUS
  Filled 2022-04-13 (×10): qty 1
  Filled 2022-04-13: qty 2

## 2022-04-13 MED ORDER — LORAZEPAM 2 MG/ML IJ SOLN
INTRAMUSCULAR | Status: AC
  Filled 2022-04-13: qty 1

## 2022-04-13 MED ORDER — DEXTROSE 50 % IV SOLN: 12.5000 g | Freq: Once | INTRAVENOUS | Status: DC

## 2022-04-13 MED ORDER — MIDAZOLAM HCL 2 MG/2ML IJ SOLN: 1.0000 mg | INTRAMUSCULAR | Status: DC | PRN

## 2022-04-13 MED ORDER — SODIUM CHLORIDE 0.9 % IV SOLN
260.0000 mg | Freq: Once | INTRAVENOUS | Status: AC
  Administered 2022-04-13: 260 mg via INTRAVENOUS
  Filled 2022-04-13: qty 2

## 2022-04-13 MED ORDER — DEXTROSE IN LACTATED RINGERS 5 % IV SOLN: INTRAVENOUS | Status: AC

## 2022-04-13 MED ORDER — DEXMEDETOMIDINE HCL IN NACL 400 MCG/100ML IV SOLN: 0.0000 ug/kg/h | INTRAVENOUS | Status: DC

## 2022-04-13 MED ORDER — PHENOBARBITAL SODIUM 130 MG/ML IJ SOLN
130.0000 mg | Freq: Three times a day (TID) | INTRAMUSCULAR | Status: DC
  Administered 2022-04-13 – 2022-04-14 (×3): 130 mg via INTRAVENOUS
  Filled 2022-04-13 (×3): qty 1

## 2022-04-13 MED ORDER — LORAZEPAM 2 MG/ML IJ SOLN: 0.0000 mg | Freq: Three times a day (TID) | INTRAMUSCULAR | Status: DC

## 2022-04-13 MED ORDER — PHENOBARBITAL SODIUM 65 MG/ML IJ SOLN: 32.5000 mg | Freq: Every day | INTRAMUSCULAR | Status: DC

## 2022-04-13 MED ORDER — LORAZEPAM 2 MG/ML IJ SOLN
2.0000 mg | Freq: Once | INTRAMUSCULAR | Status: AC
  Administered 2022-04-13: 2 mg via INTRAVENOUS

## 2022-04-13 NOTE — Progress Notes (Addendum)
Patient apparently improved now and more awake, nursing more comfortable with level of intervention needed to keep in stepdown.  Will do a phenobarb backbone taper and can add PRN ativan on top.  Hold any of these if obtunded (hold orders placed).  Will check on him tomorrow, reach out to me by securechat if any issues until [place], call E-link if any issues [phone].  Erskine Emery MD PCCM

## 2022-04-13 NOTE — Progress Notes (Signed)
Called 5W for report. RN unsure if the MD is still planning to transfer the pt. Waiting for a call back from the 5W RN.

## 2022-04-13 NOTE — Consult Note (Signed)
NAME:  Samuel Blevins, MRN:  [id number], DOB:  07-04-1965, LOS: 2 ADMISSION DATE:  04/11/2022, CONSULTATION DATE:  04/13/2022 REFERRING MD:  Graciella Freer, CHIEF COMPLAINT:  ETOH withdrawal    History of Present Illness:  Samuel Blevins is a 57 year old male with a past medical history significant for alcohol use disorder, chronic pancreatitis with pancreatic insufficiency, pancreatic pseudocyst, hypertension, chronic anemia, and thrombocytopenia is currently homeless presented to the ED for seizure-like activity 2/15.  Patient was admitted per IM TS for management of alcohol withdrawal seizures  Midmorning on 2/17 patient seen with worsening alcohol withdrawal's including active nations and elevated CIWA score.  PCCM consulted for further management and transferred to ICU for possible need of Precedex drip.  Pertinent  Medical History  Alcohol use disorder, chronic pancreatitis with pancreatic insufficiency, pancreatic pseudocyst, hypertension, chronic anemia, and thrombocytopenia  Significant Hospital Events: Including procedures, antibiotic start and stop dates in addition to other pertinent events   2/15 presented to ED with seizure-like activity at shelter.  Seizure activity lasted 2 minutes patient was postictal on EMS arrival 2/17 progressive alcohol withdrawal symptoms including elevated CIWA scale despite diazepam use, PCCM consulted  Interim History / Subjective:  Encephalopathic with active hallucinations on assessment  Objective   Blood pressure (!) 146/98, pulse (!) 108, temperature 98.1 F (36.7 C), temperature source Oral, resp. rate 18, height 5' 9"$  (1.753 m), weight 76 kg, SpO2 94 %.        Intake/Output Summary (Last 24 hours) at 04/13/2022 1143 Last data filed at 04/12/2022 [id number] Gross per 24 hour  Intake 2863.12 ml  Output 1450 ml  Net 1413.12 ml   Filed Weights   04/11/22 1521  Weight: 76 kg    Examination: General: Acute on chronic ill-appearing middle-aged male lying  in bed in no acute distress but encephalopathic HEENT: [place]/AT, MM pink/moist, PERRL,  Neuro: Alert and oriented to self only, visually hallucinating CV: s1s2 regular rate and rhythm, no murmur, rubs, or gallops,  PULM: Clear to auscultation bilaterally, no increased work of breathing, no added breath sounds GI: soft, bowel sounds active in all 4 quadrants, non-tender, non-distended Extremities: warm/dry, no edema  Skin: no rashes or lesions  Resolved Hospital Problem list     Assessment & Plan:  Alcohol use disorder actively in alcohol withdrawal Alcohol withdrawal seizures -Presented with seizure-like activity at shelter and postictal on EMS arrival Acute metabolic encephalopathy P: Maintain neuro protective measures Nutrition and bowel regiment  Seizure precautions  Aspirations precautions  Trend CIWA  Precedex drip  Supplement thiamine, folate, and MVI  Acute on chronic alcoholic pancreatitis Chronic underlying pancreatic insufficiency -CT ABD on admit with inflammation at the pancreaticoduodenal groove which could be acute edematous pancreatitis or peptic ulcer disease and severe hepatic steatosis. -Lipase on admit 214 P: IV hydration  As mentation allows oral continue diet and Creon   History of GERD Chronic gastritis -Biopsy from last EGD in June 2023 was negative for dysplasia, carcinoma and H. pylori but showed gastric metaplasia consistent with peptic duodenitis.  P: Continue PPI and Sucralfate as able   Magnesium Mia Hypokalemia P: Trend [place] Supplement as needed  Asymptomatic bacteria -Presnted with UA psoitive for nitrates and many bacteria but per H&P patient denied any urinary symptoms. Received one dose IV Rocephin in ED  P: No indications for antibiotics at this time   Thrombocytopenia Pancytopenia P: Trend CBC  Monitor for signs of bleeding  Transfuse per protocol   Hypoglycemia P: IV dextrose while NPO  Best Practice (right click and  "Reselect all SmartList Selections" daily)   Diet/type: NPO until mentation improves  DVT prophylaxis: LMWH GI prophylaxis: PPI Lines: N/A Foley:  N/A Code Status:  full code Last date of multidisciplinary goals of care discussion: Pending   Labs   CBC: Recent Labs  Lab 04/11/22 0455 04/12/22 0642 04/13/22 0426  WBC 5.0 4.8 4.7  NEUTROABS 4.1  --   --   HGB 11.4* 11.3* 10.9*  HCT 32.4* 33.2* 33.0*  MCV 91.5 92.5 94.0  PLT 146* 130* 129*    Basic Metabolic Panel: Recent Labs  Lab 04/11/22 0455 04/11/22 0653 04/12/22 0642 04/13/22 0426  NA 134*  --  130* 130*  K 3.1*  --  3.4* 3.4*  CL 94*  --  91* 95*  CO2 22  --  25 18*  GLUCOSE 92  --  82 65*  BUN 5*  --  <5* <5*  CREATININE 0.60*  --  0.66 0.68  CALCIUM 8.4*  --  8.5* 8.7*  MG  --  1.1* 1.4* 1.7   GFR: Estimated Creatinine Clearance: 101.9 mL/min (by C-G formula based on SCr of 0.68 mg/dL). Recent Labs  Lab 04/11/22 0455 04/11/22 0653 04/12/22 0642 04/13/22 0426  WBC 5.0  --  4.8 4.7  LATICACIDVEN 4.2* 1.6  --   --     Liver Function Tests: Recent Labs  Lab 04/11/22 0455 04/13/22 0426  AST 168* 86*  ALT 41 28  ALKPHOS 132* 117  BILITOT 1.0 1.8*  PROT 7.6 7.4  ALBUMIN 3.0* 2.8*   Recent Labs  Lab 04/11/22 0455  LIPASE 214*   No results for input(s): "AMMONIA" in the last 168 hours.  ABG    Component Value Date/Time   TCO2 23 08/26/2020 1141     Coagulation Profile: No results for input(s): "INR", "PROTIME" in the last 168 hours.  Cardiac Enzymes: No results for input(s): "CKTOTAL", "CKMB", "CKMBINDEX", "TROPONINI" in the last 168 hours.  HbA1C: Hgb A1c MFr Bld  Date/Time Value Ref Range Status  08/02/2021 12:47 PM 5.2 4.8 - 5.6 % Final    Comment:             Prediabetes: 5.7 - 6.4          Diabetes: >6.4          Glycemic control for adults with diabetes: <7.0     CBG: Recent Labs  Lab 04/12/22 0804 04/12/22 1200 04/12/22 1739 04/12/22 2004 04/12/22 2300  GLUCAP  81 77 73 66* 78    Review of Systems:   Unable to assess   Past Medical History:  He,  has a past medical history of Alcohol withdrawal syndrome with complication ([place]), Alcoholism ([place]), Elevated AST (SGOT), Gastrointestinal hemorrhage, Homeless, Hypertension, Pancreatic insufficiency, Symptomatic anemia (11/23/2015), and Thrombocytopenia ([place]) (05/09/2021).   Surgical History:   Past Surgical History:  Procedure Laterality Date   BIOPSY  06/23/2019   Procedure: BIOPSY;  Surgeon: Lavena Bullion, DO;  Location: [place] [facility];  Service: Gastroenterology;;   BIOPSY  12/12/2020   Procedure: BIOPSY;  Surgeon: Jackquline Denmark, MD;  Location[facility] ENDOSCOPY;  Service: Endoscopy;;   BIOPSY  08/04/2021   Procedure: BIOPSY;  Surgeon: Daryel November, MD;  Location: [facility];  Service: Gastroenterology;;   ESOPHAGOGASTRODUODENOSCOPY N/A 08/04/2021   Procedure: ESOPHAGOGASTRODUODENOSCOPY (EGD);  Surgeon: Daryel November, MD;  Location: [facility];  Service: Gastroenterology;  Laterality: N/A;   ESOPHAGOGASTRODUODENOSCOPY (EGD) WITH PROPOFOL N/A 06/23/2019   Procedure: ESOPHAGOGASTRODUODENOSCOPY (  EGD) WITH PROPOFOL;  Surgeon: Lavena Bullion, DO;  Location: [facility];  Service: Gastroenterology;  Laterality: N/A;   ESOPHAGOGASTRODUODENOSCOPY (EGD) WITH PROPOFOL N/A 12/12/2020   Procedure: ESOPHAGOGASTRODUODENOSCOPY (EGD) WITH PROPOFOL;  Surgeon: Jackquline Denmark, MD;  Location: [facility] ENDOSCOPY;  Service: Endoscopy;  Laterality: N/A;   HOT HEMOSTASIS N/A 06/23/2019   Procedure: HOT HEMOSTASIS (ARGON PLASMA COAGULATION/BICAP);  Surgeon: Lavena Bullion, DO;  Location: [facility] ENDOSCOPY;  Service: Gastroenterology;  Laterality: N/A;     Social History:   reports that he has been smoking cigarettes. He has a 30.00 pack-year smoking history. He has never used smokeless tobacco. He reports current alcohol use. He reports that he does not use drugs.   Family History:  His family history is  negative for Diabetes Mellitus II, Colon cancer, Stomach cancer, and Pancreatic cancer.   Allergies No Known Allergies   Home Medications  Prior to Admission medications   Medication Sig Start Date End Date Taking? Authorizing Provider  acetaminophen (TYLENOL) 325 MG tablet Take 2 tablets (650 mg total) by mouth every 6 (six) hours as needed for fever, moderate pain, mild pain or headache. Patient not taking: Reported on 02/14/2022 12/12/20   Riesa Pope, MD  amLODipine (NORVASC) 5 MG tablet Take 1 tablet (5 mg total) by mouth daily. Patient not taking: Reported on 02/14/2022 12/27/21   [name], MD  clobetasol cream (TEMOVATE) [id number] % Apply 1 Application topically 2 (two) times daily. 03/06/22   [name], MD  folic acid (FOLVITE) 1 MG tablet Take 1 tablet (1 mg total) by mouth daily. 10/17/21   [name], MD  Iron, Ferrous Sulfate, 325 (65 Fe) MG TABS Take 1 tablet ( 325 mg) by mouth daily. 10/17/21   [name], MD  lipase/protease/amylase (CREON) 12000-38000 units CPEP capsule Take 1 capsule (12,000 Units total) by mouth 3 (three) times daily before meals. 12/27/21   [name], MD  Multiple Vitamin (MULTIVITAMIN WITH MINERALS) TABS tablet Take 1 tablet by mouth daily. 09/27/21   [name], MD  ofloxacin (OCUFLOX) 0.3 % ophthalmic solution Place 1 drop into both eyes 4 (four) times daily. 03/20/22   [name], MD  ondansetron (ZOFRAN) 8 MG tablet Take 1 tablet (8 mg total) by mouth every 6 (six) hours as needed for nausea. 01/23/22   [name], MD  pantoprazole (PROTONIX) 40 MG tablet Take 1 tablet (40 mg total) by mouth 2 (two) times daily before a meal. 12/27/21   [name], MD  polyethylene glycol (MIRALAX / GLYCOLAX) 17 g packet Take 17 g by mouth daily as needed for mild constipation or moderate constipation. 10/07/21   Johny Blamer, DO  senna-docusate (SENOKOT-S) 8.6-50 MG tablet Take 1 tablet by mouth at bedtime  as needed for mild constipation. 10/07/21   Johny Blamer, DO  sucralfate (CARAFATE) 1 g tablet Take 1 tablet (1 g total) by mouth 4 (four) times daily -  with meals and at bedtime. 12/27/21   [name], MD  thiamine (VITAMIN B1) 100 MG tablet Take 1 tablet (100 mg total) by mouth daily. 10/02/21   [name], MD     Critical care time:   CRITICAL CARE Performed by: Lovetta Condie D. Harris   Total critical care time: 40 minutes  Critical care time was exclusive of separately billable procedures and treating other patients.  Critical care was necessary to treat or prevent imminent or life-threatening deterioration.  Critical care was time  spent personally by me on the following activities: development of treatment plan with patient and/or surrogate as well as nursing, discussions with consultants, evaluation of patient's response to treatment, examination of patient, obtaining history from patient or surrogate, ordering and performing treatments and interventions, ordering and review of laboratory studies, ordering and review of radiographic studies, pulse oximetry and re-evaluation of patient's condition.]  Yeilyn Gent D. Harris, NP-C [employer] [facility] Personal contact information can be found on Amion  If no contact or response made please call [phone] 04/13/2022, 12:49 PM

## 2022-04-13 NOTE — Plan of Care (Signed)

## 2022-04-13 NOTE — Progress Notes (Addendum)
Subjective:   Summary: Samuel Blevins is a 57 y.o. year old male currently admitted on the IMTS HD#2 for alcoholic pancreatitis.  Overnight Events: CIWAs have remained 12-15 overnight. Continues to receive ativan per CIWA protocol on top of librium taper. Still intermittently agitated.  He does awaken to verbal stimuli, but not very redirectable. States somewhat incomprehensible words but from what is discernible, wanting to leave to go outside. Discussed remaining in bed, but attempting to get OOB.   Later this AM, received message from RN that patient remained agitated despite ativan and refused librium. Continuing to attempt to get OOB. Discussed with PCCM, who evaluated patient and will transfer to ICU for precedex gtt.  Objective:  Vital signs in last 24 hours: Vitals:   04/13/22 0700 04/13/22 1132 04/13/22 1200 04/13/22 1330  BP: (!) 150/128 (!) 146/98 (!) 149/98 (!) 153/91  Pulse: 100 (!) 108 (!) 108 93  Resp:   19   Temp: 98.4 F (36.9 C) 98.1 F (36.7 C)    TempSrc: Oral Oral    SpO2: 93% 94%    Weight:      Height:       Supplemental O2: Room Air    Physical Exam:  Not participatory with physical examination.  General: middle aged male with partly incomprehensible speech, tremulous, agitated. CV: deferred. Pulm: deferred but overtly normal WOB on RA. Abdomen: deferred. Neuro: awakens to verbal stimuli, incomprehensible speech, tremulous, agitated. Moves all extremities spontaneously. Not participatory with orientation questions.    Filed Weights   04/11/22 1521  Weight: 76 kg     Intake/Output Summary (Last 24 hours) at 04/13/2022 1346 Last data filed at 04/12/2022 1822 Gross per 24 hour  Intake 2863.12 ml  Output 1450 ml  Net 1413.12 ml   Net IO Since Admission: -114.98 mL [04/13/22 1346]  Pertinent Labs:    Latest Ref Rng & Units 04/13/2022    4:26 AM 04/12/2022    6:42 AM 04/11/2022    4:55 AM  CBC  WBC 4.0 - 10.5 K/uL 4.7   4.8  5.0   Hemoglobin 13.0 - 17.0 g/dL 10.9  11.3  11.4   Hematocrit 39.0 - 52.0 % 33.0  33.2  32.4   Platelets 150 - 400 K/uL 129  130  146        Latest Ref Rng & Units 04/13/2022    4:26 AM 04/12/2022    6:42 AM 04/11/2022    4:55 AM  CMP  Glucose 70 - 99 mg/dL 65  82  92   BUN 6 - 20 mg/dL <5  <5  5   Creatinine 0.61 - 1.24 mg/dL 0.68  0.66  0.60   Sodium 135 - 145 mmol/L 130  130  134   Potassium 3.5 - 5.1 mmol/L 3.4  3.4  3.1   Chloride 98 - 111 mmol/L 95  91  94   CO2 22 - 32 mmol/L 18  25  22   $ Calcium 8.9 - 10.3 mg/dL 8.7  8.5  8.4   Total Protein 6.5 - 8.1 g/dL 7.4   7.6   Total Bilirubin 0.3 - 1.2 mg/dL 1.8   1.0   Alkaline Phos 38 - 126 U/L 117   132   AST 15 - 41 U/L 86   168   ALT 0 - 44 U/L 28   41     Imaging: No results  found.   EKG: My EKG interpretation is as follows: sinus tachy with PVCs  Assessment/Plan:   Principal Problem:   Alcohol withdrawal [facility])   Patient Summary: [name] is a 57 yo male with PMH alcohol use disorder, chronic pancreatitis with pancreatic insufficiency, pancreatic pseudocyst, hypertension, chronic anemia and thrombocytopenia, and homelessness who presented after a witnessed seizure and admitted for alcohol withdrawal and acute on chronic pancreatitis   #Alcohol withdrawal #Alcohol withdrawal Seizure Patient has a history of severe alcohol use disorder with multiple hospitalizations for alcohol induced pancreatitis and alcohol withdrawal and has previously required ICU level care. He presented after a witnessed seizure at his shelter. He reports history of seizures related to his alcohol use and when he cuts down stops drinking but unsure when his last seizure was. Last drink about 2-3 days ago. CIWAs have consistently remained elevated (>10) with CIWAs ranging 12-15 overnight despite ativan and librium. He remains agitated today and is at risk for further decompensation. PCCM evaluated patient and will transfer to ICU  for precedex gtt. -greatly appreciate PCCM management -precedex gtt -thiamine, MVA, folate -seizure and fall precautions   #Acute on chronic alcoholic pancreatitis #Pancreatic insufficiency Initial workup shows acute on chronic alcoholic pancreatitis.  He has minimal pain reported, though difficult to ascertain with unclear communication from him.  Has had poor po intake while here. Given slightly low CBG values, added D5 to LR drip for maintenance. -regular diet -IV D5-LR @75cc$ /h -continue home Creon 12,000 units 3 times daily before meals   #GERD #Chronic gastritis  Complications of his chronic alcohol abuse. Past EGDs have shown gastritis, 3 cm hiatal hernia, duodenal erosion and duodenal polyp. Biopsy from last EGD in June 2023 was negative for dysplasia, carcinoma and H. pylori but showed gastric metaplasia consistent with peptic duodenitis.  Patient's gastritis likely has not improved due to his constant alcohol use. -continue Protonix 40 mg twice daily -continue sucralfate 1 g 3 times daily with meals and at bedtime   #Hypomagnesemia #Hypokalemia Will replete electrolytes as needed -Trend and replete electrolytes   #Asymptomatic bacteriuria On admission, urinalysis showed mild ketonuria, positive nitrate, neg leuks and many bacteria.  Patient denies any dysuria, burning with urination or hematuria. S/p 1 dose of Rocephin in the ED.  Will hold off on further antibiotics as patient is asymptomatic. -CTM   #HTN Patient found to be hypertensive with SBP in the 140s to 150s on admission in the setting of alcohol withdrawal. -continue amlodipine 5 mg daily   #Pancytopenia #Thrombocytopenia Suspected due to bone marrow suppression from chronic alcohol use.  No evidence of active GI bleed. -Daily CBC  Diet: Normal IVF: NS,125cc/hr VTE: Enoxaparin Code: Full PT/OT recs: Pending TOC recs: Pending, substance use, homelessness Family Update:   Dispo: Anticipated discharge  tpending clinical improvement   Virl Axe, MD PGY-3 Internal Medicine Resident Please contact the on call pager after 5 pm and on weekends at [phone].

## 2022-04-13 NOTE — Progress Notes (Signed)
Attempted to call [phone] for report.Assigned nurse couldnot be reached out.

## 2022-04-13 NOTE — Progress Notes (Signed)
Discussed with Dr. Tamala Julian and nursing staff. Mixup in patient receiving ativan per CIWA overnight leading to agitation. He is much more awake and redirectable now. Will use phenobarb taper as planned by PCCM along with prn ativan via CIWA protocol. Greatly appreciate PCCM evaluating patient.

## 2022-04-14 DIAGNOSIS — F10931 Alcohol use, unspecified with withdrawal delirium: Secondary | ICD-10-CM | POA: Diagnosis not present

## 2022-04-14 DIAGNOSIS — F10939 Alcohol use, unspecified with withdrawal, unspecified: Secondary | ICD-10-CM | POA: Diagnosis not present

## 2022-04-14 DIAGNOSIS — K852 Alcohol induced acute pancreatitis without necrosis or infection: Secondary | ICD-10-CM | POA: Diagnosis not present

## 2022-04-14 DIAGNOSIS — R569 Unspecified convulsions: Secondary | ICD-10-CM | POA: Diagnosis not present

## 2022-04-14 LAB — GLUCOSE, CAPILLARY
Glucose-Capillary: 78 mg/dL (ref 70–99)
Glucose-Capillary: 70 mg/dL (ref 70–99)
Glucose-Capillary: 89 mg/dL (ref 70–99)
Glucose-Capillary: 163 mg/dL — ABNORMAL HIGH (ref 70–99)
Glucose-Capillary: 100 mg/dL — ABNORMAL HIGH (ref 70–99)

## 2022-04-14 LAB — CBC
HCT: 33.9 % — ABNORMAL LOW (ref 39.0–52.0)
Hemoglobin: 12.1 g/dL — ABNORMAL LOW (ref 13.0–17.0)
MCH: 32.7 pg (ref 26.0–34.0)
MCHC: 35.7 g/dL (ref 30.0–36.0)
MCV: 91.6 fL (ref 80.0–100.0)
Platelets: 153 10*3/uL (ref 150–400)
RBC: 3.7 MIL/uL — ABNORMAL LOW (ref 4.22–5.81)
RDW: 15.9 % — ABNORMAL HIGH (ref 11.5–15.5)
WBC: 4.8 10*3/uL (ref 4.0–10.5)
nRBC: 0 % (ref 0.0–0.2)

## 2022-04-14 LAB — BASIC METABOLIC PANEL
Anion gap: 16 — ABNORMAL HIGH (ref 5–15)
BUN: 5 mg/dL — ABNORMAL LOW (ref 6–20)
CO2: 20 mmol/L — ABNORMAL LOW (ref 22–32)
Calcium: 9 mg/dL (ref 8.9–10.3)
Chloride: 95 mmol/L — ABNORMAL LOW (ref 98–111)
Creatinine, Ser: 0.72 mg/dL (ref 0.61–1.24)
GFR, Estimated: >60 mL/min (ref >60)
Glucose, Bld: 76 mg/dL (ref 70–99)
Potassium: 3.2 mmol/L — ABNORMAL LOW (ref 3.5–5.1)
Sodium: 131 mmol/L — ABNORMAL LOW (ref 135–145)

## 2022-04-14 LAB — MAGNESIUM: Magnesium: 1.4 mg/dL — ABNORMAL LOW (ref 1.7–2.4)

## 2022-04-14 LAB — PHOSPHORUS: Phosphorus: 4.3 mg/dL (ref 2.5–4.6)

## 2022-04-14 MED ORDER — PHENOBARBITAL SODIUM 130 MG/ML IJ SOLN
65.0000 mg | Freq: Two times a day (BID) | INTRAMUSCULAR | Status: DC
  Administered 2022-04-15: 65 mg via INTRAVENOUS
  Filled 2022-04-14: qty 1

## 2022-04-14 MED ORDER — PHENOBARBITAL SODIUM 65 MG/ML IJ SOLN: 32.5000 mg | Freq: Every day | INTRAMUSCULAR | Status: DC

## 2022-04-14 MED ORDER — NICOTINE 21 MG/24HR TD PT24
21.0000 mg | MEDICATED_PATCH | Freq: Every day | TRANSDERMAL | Status: DC
  Administered 2022-04-14 – 2022-04-15 (×2): 21 mg via TRANSDERMAL
  Filled 2022-04-14 (×2): qty 1

## 2022-04-14 MED ORDER — DEXTROSE IN LACTATED RINGERS 5 % IV SOLN
INTRAVENOUS | Status: AC
  Filled 2022-04-14: qty 1000

## 2022-04-14 MED ORDER — MAGNESIUM SULFATE 2 GM/50ML IV SOLN
2.0000 g | Freq: Once | INTRAVENOUS | Status: AC
  Administered 2022-04-14: 2 g via INTRAVENOUS
  Filled 2022-04-14: qty 50

## 2022-04-14 MED ORDER — THIAMINE HCL 100 MG/ML IJ SOLN
500.0000 mg | INTRAVENOUS | Status: DC
  Administered 2022-04-14 – 2022-04-15 (×2): 500 mg via INTRAVENOUS
  Filled 2022-04-14 (×2): qty 5

## 2022-04-14 MED ORDER — POTASSIUM CHLORIDE 10 MEQ/100ML IV SOLN
10.0000 meq | INTRAVENOUS | Status: AC
  Administered 2022-04-14 (×4): 10 meq via INTRAVENOUS
  Filled 2022-04-14 (×4): qty 100

## 2022-04-14 NOTE — Progress Notes (Signed)
Subjective:   Summary: Samuel Blevins is a 57 y.o. year old male currently admitted on the IMTS HD#3 for alcoholic pancreatitis.  Overnight Events: Remain in stepdown, CIWA scores ranging from 8-14 on phenobarbital taper and PRN Ativan.  Received 6 mg of Ativan overnight.  Paged this morning about agitation as we are going to assess.  He had been agitated earlier this morning with CIWA of 14, RN mentioned that he has been try to put on his clothes and say that he wanted to leave, though he is still heavily sedated and disoriented.  By time he arrived, he was eating breakfast with RN assistance.  Mentions that when he is distracted with a meal or otherwise, he is redirectable and not agitated.  Discussed with him the importance of staying as we are treating his withdrawal symptoms, he seemed to understand and but unsure given his minimal communication.    Objective:  Vital signs in last 24 hours: Vitals:   04/13/22 1752 04/13/22 2000 04/14/22 0000 04/14/22 0400  BP:  (!) 132/93 (!) 128/97 (!) 127/95  Pulse: 97  91 (!) 103  Resp: 19 18 19 20  $ Temp:  98.3 F (36.8 C) 97.8 F (36.6 C) 98.1 F (36.7 C)  TempSrc:  Oral Axillary Axillary  SpO2:  93% 97% 98%  Weight:      Height:       Supplemental O2: Room Air    Physical Exam:  Not participatory with physical examination.  General: middle aged male with partly incomprehensible speech, tremulous, eating breakfast. CV: Tachycardic, regular rhythm, no MRG Pulm: Lungs CTAB Abdomen: deferred. Neuro: Responds to questions with him incomprehensible speech, tremulous, assessed when he was eating and did not seem to be agitated. Moves all extremities spontaneously. Not participatory with orientation questions.    Filed Weights   04/11/22 1521  Weight: 76 kg     Intake/Output Summary (Last 24 hours) at 04/14/2022 0741 Last data filed at 04/13/2022 1619 Gross per 24 hour  Intake 661.68 ml  Output --  Net 661.68  ml    Net IO Since Admission: 546.7 mL [04/14/22 0741]  Pertinent Labs:    Latest Ref Rng & Units 04/14/2022    4:41 AM 04/13/2022    4:26 AM 04/12/2022    6:42 AM  CBC  WBC 4.0 - 10.5 K/uL 4.8  4.7  4.8   Hemoglobin 13.0 - 17.0 g/dL 12.1  10.9  11.3   Hematocrit 39.0 - 52.0 % 33.9  33.0  33.2   Platelets 150 - 400 K/uL 153  129  130        Latest Ref Rng & Units 04/14/2022    4:41 AM 04/13/2022    4:26 AM 04/12/2022    6:42 AM  CMP  Glucose 70 - 99 mg/dL 76  65  82   BUN 6 - 20 mg/dL 5  <5  <5   Creatinine 0.61 - 1.24 mg/dL 0.72  0.68  0.66   Sodium 135 - 145 mmol/L 131  130  130   Potassium 3.5 - 5.1 mmol/L 3.2  3.4  3.4   Chloride 98 - 111 mmol/L 95  95  91   CO2 22 - 32 mmol/L 20  18  25   $ Calcium 8.9 - 10.3 mg/dL 9.0  8.7  8.5   Total Protein 6.5 - 8.1 g/dL  7.4    Total Bilirubin  0.3 - 1.2 mg/dL  1.8    Alkaline Phos 38 - 126 U/L  117    AST 15 - 41 U/L  86    ALT 0 - 44 U/L  28    Mag 1.4  Imaging: No results found.   EKG: My EKG interpretation is as follows: sinus tachy with PVCs  Assessment/Plan:   Principal Problem:   Alcohol withdrawal ([name]) Active Problems:   Alcohol withdrawal with inpatient treatment with perceptual disturbance [facility])   Patient Summary: [name] is a 57 yo male with PMH alcohol use disorder, chronic pancreatitis with pancreatic insufficiency, pancreatic pseudocyst, hypertension, chronic anemia and thrombocytopenia, and homelessness who presented after a witnessed seizure and admitted for alcohol withdrawal and acute on chronic pancreatitis   #Alcohol withdrawal #Alcohol withdrawal Seizure Patient has a history of severe alcohol use disorder with multiple hospitalizations for alcohol induced pancreatitis and alcohol withdrawal and has previously required ICU level care. He presented after a witnessed seizure at his shelter. He reports history of seizures related to his alcohol use and when he cuts down stops drinking but  unsure when his last seizure was. Last drink about 3-4 days ago. CIWAs have consistently remained elevated on phenobarb taper with as needed Ativan.  During our assessment, he was calm and eating breakfast, but RN mentioned that he is agitated at other times and trying to get out of bed and leave.  Nursing staff feel comfortable watching him for now, but low threshold to transfer to ICU if needed for Precedex drip if he decompensates -greatly appreciate PCCM management -Continue phenobarbital taper and PRN ativan, atarax TID PRN for anxiety -thiamine, MVA, folate -seizure and fall precautions   #Acute on chronic alcoholic pancreatitis #Pancreatic insufficiency Initial workup shows acute on chronic alcoholic pancreatitis.  He has minimal pain reported, though difficult to ascertain with unclear communication from him.  Seems to be eating a little bit at time of our assessment, but p.o. intake is inconsistent continuing maintenance D5 LR.  If he does not improve p.o. intake by tomorrow, we will have to place core track and initiate tube feeds in the setting of pancreatitis -regular diet -IV D5-LR @100cc$ /h - Zofran as needed for nausea -continue home Creon 12,000 units 3 times daily before meals   #GERD #Chronic gastritis  Complications of his chronic alcohol abuse. Past EGDs have shown gastritis, 3 cm hiatal hernia, duodenal erosion and duodenal polyp. Biopsy from last EGD in June 2023 was negative for dysplasia, carcinoma and H. pylori but showed gastric metaplasia consistent with peptic duodenitis.  Patient's gastritis likely has not improved due to his constant alcohol use. -continue Protonix 40 mg twice daily -continue sucralfate 1 g 3 times daily with meals and at bedtime   #Hypomagnesemia #Hypokalemia Will replete electrolytes as needed -Trend and replete electrolytes   #Asymptomatic bacteriuria On admission, urinalysis showed mild ketonuria, positive nitrate, neg leuks and many  bacteria.  Patient denies any dysuria, burning with urination or hematuria. S/p 1 dose of Rocephin in the ED.  Will hold off on further antibiotics as patient is asymptomatic. -CTM   #HTN Patient found to be hypertensive with SBP in the 140s to 150s on admission in the setting of alcohol withdrawal. -continue amlodipine 5 mg daily   #Pancytopenia #Thrombocytopenia Suspected due to bone marrow suppression from chronic alcohol use.  No evidence of active GI bleed. -Daily CBC  Diet: Normal IVF: D5 LR 100 cc/h VTE: Enoxaparin Code: Full PT/OT recs: Pending TOC  recs: Pending, substance use, homelessness Family Update:   Dispo: Anticipated discharge tpending clinical improvement   Linus Galas, MD PGY-1 Internal Medicine Resident Pager: [phone] Please contact the on call pager after 5 pm and on weekends at [phone].

## 2022-04-15 ENCOUNTER — Emergency Department (HOSPITAL_COMMUNITY): Payer: Medicaid Other

## 2022-04-15 ENCOUNTER — Emergency Department (HOSPITAL_COMMUNITY)
Admission: EM | Admit: 2022-04-15 | Discharge: 2022-04-15 | Disposition: A | Discharge status: Home / Self Care | Payer: Medicaid Other | Attending: Emergency Medicine | Admitting: Emergency Medicine

## 2022-04-15 ENCOUNTER — Encounter (HOSPITAL_COMMUNITY): Payer: Self-pay

## 2022-04-15 DIAGNOSIS — I10 Essential (primary) hypertension: Secondary | ICD-10-CM | POA: Insufficient documentation

## 2022-04-15 DIAGNOSIS — R7401 Elevation of levels of liver transaminase levels: Secondary | ICD-10-CM | POA: Insufficient documentation

## 2022-04-15 DIAGNOSIS — S0990XA Unspecified injury of head, initial encounter: Secondary | ICD-10-CM | POA: Insufficient documentation

## 2022-04-15 DIAGNOSIS — E871 Hypo-osmolality and hyponatremia: Secondary | ICD-10-CM | POA: Insufficient documentation

## 2022-04-15 DIAGNOSIS — R17 Unspecified jaundice: Secondary | ICD-10-CM | POA: Insufficient documentation

## 2022-04-15 DIAGNOSIS — Z5900 Homelessness unspecified: Secondary | ICD-10-CM | POA: Insufficient documentation

## 2022-04-15 DIAGNOSIS — W19XXXA Unspecified fall, initial encounter: Secondary | ICD-10-CM

## 2022-04-15 DIAGNOSIS — Z79899 Other long term (current) drug therapy: Secondary | ICD-10-CM | POA: Insufficient documentation

## 2022-04-15 DIAGNOSIS — F17210 Nicotine dependence, cigarettes, uncomplicated: Secondary | ICD-10-CM | POA: Insufficient documentation

## 2022-04-15 DIAGNOSIS — W01198A Fall on same level from slipping, tripping and stumbling with subsequent striking against other object, initial encounter: Secondary | ICD-10-CM | POA: Insufficient documentation

## 2022-04-15 DIAGNOSIS — R000 Tachycardia, unspecified: Secondary | ICD-10-CM | POA: Insufficient documentation

## 2022-04-15 LAB — BASIC METABOLIC PANEL
Anion gap: 13 (ref 5–15)
BUN: 5 mg/dL — ABNORMAL LOW (ref 6–20)
CO2: 22 mmol/L (ref 22–32)
Calcium: 8.9 mg/dL (ref 8.9–10.3)
Chloride: 97 mmol/L — ABNORMAL LOW (ref 98–111)
Creatinine, Ser: 0.58 mg/dL — ABNORMAL LOW (ref 0.61–1.24)
GFR, Estimated: >60 mL/min (ref >60)
Glucose, Bld: 96 mg/dL (ref 70–99)
Potassium: 3.3 mmol/L — ABNORMAL LOW (ref 3.5–5.1)
Sodium: 132 mmol/L — ABNORMAL LOW (ref 135–145)

## 2022-04-15 LAB — CBC
HCT: 32 % — ABNORMAL LOW (ref 39.0–52.0)
HCT: 35.9 % — ABNORMAL LOW (ref 39.0–52.0)
Hemoglobin: 11.3 g/dL — ABNORMAL LOW (ref 13.0–17.0)
Hemoglobin: 12 g/dL — ABNORMAL LOW (ref 13.0–17.0)
MCH: 32.8 pg (ref 26.0–34.0)
MCH: 32.6 pg (ref 26.0–34.0)
MCHC: 35.3 g/dL (ref 30.0–36.0)
MCHC: 33.4 g/dL (ref 30.0–36.0)
MCV: 92.8 fL (ref 80.0–100.0)
MCV: 97.6 fL (ref 80.0–100.0)
Platelets: 196 10*3/uL (ref 150–400)
Platelets: 169 10*3/uL (ref 150–400)
RBC: 3.45 MIL/uL — ABNORMAL LOW (ref 4.22–5.81)
RBC: 3.68 MIL/uL — ABNORMAL LOW (ref 4.22–5.81)
RDW: 15.9 % — ABNORMAL HIGH (ref 11.5–15.5)
RDW: 16.1 % — ABNORMAL HIGH (ref 11.5–15.5)
WBC: 3.8 10*3/uL — ABNORMAL LOW (ref 4.0–10.5)
WBC: 4.5 10*3/uL (ref 4.0–10.5)
nRBC: 0 % (ref 0.0–0.2)
nRBC: 0 % (ref 0.0–0.2)

## 2022-04-15 LAB — COMPREHENSIVE METABOLIC PANEL
ALT: 49 U/L — ABNORMAL HIGH (ref 0–44)
AST: 123 U/L — ABNORMAL HIGH (ref 15–41)
Albumin: 3 g/dL — ABNORMAL LOW (ref 3.5–5.0)
Alkaline Phosphatase: 121 U/L (ref 38–126)
Anion gap: 15 (ref 5–15)
BUN: 6 mg/dL (ref 6–20)
CO2: 21 mmol/L — ABNORMAL LOW (ref 22–32)
Calcium: 9.1 mg/dL (ref 8.9–10.3)
Chloride: 96 mmol/L — ABNORMAL LOW (ref 98–111)
Creatinine, Ser: 0.87 mg/dL (ref 0.61–1.24)
GFR, Estimated: >60 mL/min (ref >60)
Glucose, Bld: 75 mg/dL (ref 70–99)
Potassium: 4.1 mmol/L (ref 3.5–5.1)
Sodium: 132 mmol/L — ABNORMAL LOW (ref 135–145)
Total Bilirubin: 2.1 mg/dL — ABNORMAL HIGH (ref 0.3–1.2)
Total Protein: 7.8 g/dL (ref 6.5–8.1)

## 2022-04-15 LAB — PHOSPHORUS: Phosphorus: 3.8 mg/dL (ref 2.5–4.6)

## 2022-04-15 LAB — GLUCOSE, CAPILLARY: Glucose-Capillary: 118 mg/dL — ABNORMAL HIGH (ref 70–99)

## 2022-04-15 LAB — MAGNESIUM: Magnesium: 1.5 mg/dL — ABNORMAL LOW (ref 1.7–2.4)

## 2022-04-15 LAB — RAPID URINE DRUG SCREEN, HOSP PERFORMED
Amphetamines: NOT DETECTED
Barbiturates: POSITIVE — AB
Benzodiazepines: POSITIVE — AB
Cocaine: NOT DETECTED
Opiates: NOT DETECTED
Tetrahydrocannabinol: NOT DETECTED

## 2022-04-15 LAB — LACTIC ACID, PLASMA: Lactic Acid, Venous: 1 mmol/L (ref 0.5–1.9)

## 2022-04-15 LAB — ETHANOL: Alcohol, Ethyl (B): <10 mg/dL (ref <10)

## 2022-04-15 MED ORDER — LACTATED RINGERS IV BOLUS
1000.0000 mL | Freq: Once | INTRAVENOUS | Status: AC
  Administered 2022-04-15: 1000 mL via INTRAVENOUS

## 2022-04-15 MED ORDER — PHENOBARBITAL SODIUM 65 MG/ML IJ SOLN: 32.5000 mg | Freq: Once | INTRAMUSCULAR | Status: DC

## 2022-04-15 MED ORDER — PHENOBARBITAL SODIUM 130 MG/ML IJ SOLN: 65.0000 mg | Freq: Two times a day (BID) | INTRAMUSCULAR | Status: DC

## 2022-04-15 NOTE — ED Provider Notes (Signed)
[facility] Provider Note   CSN: [id number] Arrival date & time: 04/15/22  1411     History  Chief Complaint  Patient presents with   Samuel Blevins is a 57 y.o. male.  This is a 57 year old male history of alcohol use disorder, chronic pancreatitis with pancreatic insufficiency, pancreatic pseudocyst, hypertension, chronic anemia and thrombocytopenia as well as homelessness who presents to the ED after a fall.  Patient left the hospital earlier today AMA, was admitted for alcohol withdrawal complicated by seizure, acute on chronic pancreatitis, pancreatic insufficiency as well as electrolyte abnormalities.  He was leaving [place] and trying to pick up his cigarettes when he fell and hit his head.  He complains only of head pain, denies any other injuries.    Home Medications Prior to Admission medications   Medication Sig Start Date End Date Taking? Authorizing Provider  acetaminophen (TYLENOL) 325 MG tablet Take 2 tablets (650 mg total) by mouth every 6 (six) hours as needed for fever, moderate pain, mild pain or headache. Patient not taking: Reported on 02/14/2022 12/12/20   Riesa Pope, MD  amLODipine (NORVASC) 5 MG tablet Take 1 tablet (5 mg total) by mouth daily. Patient not taking: Reported on 02/14/2022 12/27/21   [name], MD  clobetasol cream (TEMOVATE) [id number] % Apply 1 Application topically 2 (two) times daily. 03/06/22   [name], MD  folic acid (FOLVITE) 1 MG tablet Take 1 tablet (1 mg total) by mouth daily. 10/17/21   [name], MD  Iron, Ferrous Sulfate, 325 (65 Fe) MG TABS Take 1 tablet ( 325 mg) by mouth daily. 10/17/21   [name], MD  lipase/protease/amylase (CREON) 12000-38000 units CPEP capsule Take 1 capsule (12,000 Units total) by mouth 3 (three) times daily before meals. 12/27/21   [name], MD  Multiple Vitamin (MULTIVITAMIN WITH MINERALS) TABS tablet Take  1 tablet by mouth daily. 09/27/21   [name], MD  ofloxacin (OCUFLOX) 0.3 % ophthalmic solution Place 1 drop into both eyes 4 (four) times daily. 03/20/22   [name], MD  ondansetron (ZOFRAN) 8 MG tablet Take 1 tablet (8 mg total) by mouth every 6 (six) hours as needed for nausea. 01/23/22   [name], MD  pantoprazole (PROTONIX) 40 MG tablet Take 1 tablet (40 mg total) by mouth 2 (two) times daily before a meal. 12/27/21   [name], MD  polyethylene glycol (MIRALAX / GLYCOLAX) 17 g packet Take 17 g by mouth daily as needed for mild constipation or moderate constipation. 10/07/21   Johny Blamer, DO  senna-docusate (SENOKOT-S) 8.6-50 MG tablet Take 1 tablet by mouth at bedtime as needed for mild constipation. 10/07/21   Johny Blamer, DO  sucralfate (CARAFATE) 1 g tablet Take 1 tablet (1 g total) by mouth 4 (four) times daily -  with meals and at bedtime. 12/27/21   [name], MD  thiamine (VITAMIN B1) 100 MG tablet Take 1 tablet (100 mg total) by mouth daily. 10/02/21   [name], MD      Allergies    Patient has no known allergies.    Review of Systems   Review of Systems  Constitutional:  Negative for chills and fever.  Respiratory:  Negative for cough and shortness of breath.   Cardiovascular:  Negative for chest pain.  Gastrointestinal:  Negative for abdominal pain.  Genitourinary:  Negative for dysuria.  Neurological:  Negative for dizziness and weakness.    Physical Exam Updated Vital Signs BP (!) 85/63 (BP Location: Right Arm)   Pulse (!) 115   Temp 97.8 F (36.6 C) (Oral)   Resp 18   Ht 5' 9"$  (1.753 m)   Wt 75.8 kg   SpO2 96%   BMI 24.66 kg/m  Physical Exam Vitals and nursing note reviewed.  Constitutional:      General: He is not in acute distress. HENT:     Head: Normocephalic.     Nose: Nose normal.     Mouth/Throat:     Mouth: Mucous membranes are moist.  Eyes:     Pupils: Pupils are equal, round, and reactive to  light.  Cardiovascular:     Rate and Rhythm: Normal rate and regular rhythm.     Pulses: Normal pulses.     Heart sounds: Normal heart sounds. No murmur heard.    No friction rub. No gallop.  Pulmonary:     Effort: Pulmonary effort is normal.  Abdominal:     General: There is no distension.     Palpations: Abdomen is soft.     Tenderness: There is no abdominal tenderness. There is no guarding or rebound.  Skin:    General: Skin is warm.     Capillary Refill: Capillary refill takes less than 2 seconds.  Neurological:     General: No focal deficit present.     Mental Status: He is alert and oriented to person, place, and time.     ED Results / Procedures / Treatments   Labs (all labs ordered are listed, but only abnormal results are displayed) Labs Reviewed  COMPREHENSIVE METABOLIC PANEL  ETHANOL  CBC  RAPID URINE DRUG SCREEN, HOSP PERFORMED    EKG None  Radiology CT Head Wo Contrast  Result Date: 04/15/2022 CLINICAL DATA:  Fall head trauma abnormal mental status EXAM: CT HEAD WITHOUT CONTRAST TECHNIQUE: Contiguous axial images were obtained from the base of the skull through the vertex without intravenous contrast. RADIATION DOSE REDUCTION: This exam was performed according to the departmental dose-optimization program which includes automated exposure control, adjustment of the mA and/or kV according to patient size and/or use of iterative reconstruction technique. COMPARISON:  CT brain 04/11/2022, 01/30/2022 FINDINGS: Brain: No acute territorial infarction, hemorrhage or intracranial mass. Mild atrophy. Patchy white matter hypodensity consistent with chronic small vessel ischemic change. Stable ventricle size. Vascular: No hyperdense vessels.  Carotid vascular calcification Skull: Normal. Negative for fracture or focal lesion. Sinuses/Orbits: No acute finding. Other: None IMPRESSION: 1. No CT evidence for acute intracranial abnormality. 2. Atrophy and chronic small vessel  ischemic changes of the white matter. Electronically Signed   By: Donavan Foil M.D.   On: 04/15/2022 15:15    Procedures Procedures    Medications Ordered in ED Medications - No data to display  ED Course/ Medical Decision Making/ A&P                             Medical Decision Making Patient presents after a fall outside, he recently left AMA after admission for alcohol withdrawal and pancreatitis.  His initial vitals were significant for hypotension of 85/63 with tachycardia to 115.  Patient was at first bedded in the hallway and moved to a room, subsequent blood pressure improved at 130/89 with a pulse rate of 89.  He denies any acute concerns for headache.  CT head was done  in triage.  I personally reviewed and interpreted this image and agree with radiology, no acute intracranial hemorrhage, no large territory infarction, no space-occupying lesion.  We will plan to get basic labs including CBC, CMP, urine drug screen and lactic acid.  EKG ordered.  We will continually reevaluate patient for capacity.  Frontal diagnosis including dehydration, orthostatic syncope, mechanical fall, cardiogenic syncope.  I personally reviewed and interpreted patient's labs which were significant for mild hyponatremia and hypochloremia, similar to prior labs earlier today, mildly elevated AST and ALT consistent with heavy alcohol use.  Bilirubin minimally elevated at 2.1.  CBC with a stable hemoglobin of 12, no leukocytosis.  Overall when compared to admission labs patient's labs are stable.  Lactic acid is normal, I have low suspicion for occult shock.  I personally reviewed and interpreted patient's EKG which shows normal sinus rhythm, PR, QRS, QTc normal, no acute ischemic changes, no ST depressions or elevations.  Patient is not complaining of any chest pain, no palpitations, has been monitored on telemetry in the ED and has no evidence of dysrhythmia, has a normal EKG, I have low suspicion for  cardiogenic syncope and patient states he remembers the fall and that it was mechanical.  Upon reevaluation patient is alert and oriented, able to describe why he is in the hospital.  He states he did fall, states it was a mechanical fall as he was reaching for cigarettes.  He states he would still like to leave and be discharged.  Patient has capacity, was subsequently discharged and given strict return precautions if he has any chest pain, shortness of breath, or inability to tolerate oral intake to return to the ED for evaluation.  Patient was stable at discharge.  Amount and/or Complexity of Data Reviewed External Data Reviewed: notes.    Details: Admission notes reviewed, patient admitted for pancreatitis and alcohol withdrawal, medically managed with Librium taper then phenobarbital Labs: ordered. Decision-making details documented in ED Course. Radiology: ordered and independent interpretation performed. Decision-making details documented in ED Course. ECG/medicine tests: ordered and independent interpretation performed. Decision-making details documented in ED Course.           Final Clinical Impression(s) / ED Diagnoses Final diagnoses:  None    Rx / DC Orders ED Discharge Orders     None         Jimmie Molly, MD 04/15/22 2225    Blanchie Dessert, MD 04/16/22 [phone]

## 2022-04-15 NOTE — Discharge Instructions (Signed)
You were evaluated in the emergency department and your labs all look very good.  You stated you would like to go home, you did leave AMA earlier today and we do not have any reason to admit you at this point.  Please follow-up outpatient with your PCP.  Attached instructions are for alcohol misuse, shelters and counseling.  Please return to the ED if you have any emergent concerns.

## 2022-04-15 NOTE — Discharge Summary (Signed)
Name: Samuel Blevins MRN: [id number] DOB: 04-04-1965 57 y.o. PCP: [name], MD  Date of Admission: 04/11/2022  4:01 AM Date of Discharge:   Patient Left AMA on 04/15/2022. Attending Physician: Dr. Dareen Piano  Discharge Diagnosis: 1. Principal Problem:   Alcohol withdrawal ([place]) Active Problems:   Alcohol withdrawal with inpatient treatment with perceptual disturbance [facility])  Patient with decision making capacity. Left AMA 04/15/2022.   Discharge Medications: Allergies as of 04/15/2022   No Known Allergies      Medication List     ASK your doctor about these medications    acetaminophen 325 MG tablet Commonly known as: TYLENOL Take 2 tablets (650 mg total) by mouth every 6 (six) hours as needed for fever, moderate pain, mild pain or headache.   amLODipine 5 MG tablet Commonly known as: NORVASC Take 1 tablet (5 mg total) by mouth daily.   clobetasol cream 0.05 % Commonly known as: TEMOVATE Apply 1 Application topically 2 (two) times daily.   Creon 12000-38000 units Cpep capsule Generic drug: lipase/protease/amylase Take 1 capsule (12,000 Units total) by mouth 3 (three) times daily before meals.   folic acid 1 MG tablet Commonly known as: FOLVITE Take 1 tablet (1 mg total) by mouth daily.   Iron (Ferrous Sulfate) 325 (65 Fe) MG Tabs Take 1 tablet ( 325 mg) by mouth daily.   multivitamin with minerals Tabs tablet Take 1 tablet by mouth daily.   ofloxacin 0.3 % ophthalmic solution Commonly known as: Ocuflox Place 1 drop into both eyes 4 (four) times daily.   ondansetron 8 MG tablet Commonly known as: ZOFRAN Take 1 tablet (8 mg total) by mouth every 6 (six) hours as needed for nausea.   pantoprazole 40 MG tablet Commonly known as: PROTONIX Take 1 tablet (40 mg total) by mouth 2 (two) times daily before a meal.   polyethylene glycol 17 g packet Commonly known as: MIRALAX / GLYCOLAX Take 17 g by mouth daily as needed for mild constipation or moderate  constipation.   senna-docusate 8.6-50 MG tablet Commonly known as: Senokot-S Take 1 tablet by mouth at bedtime as needed for mild constipation.   sucralfate 1 g tablet Commonly known as: Carafate Take 1 tablet (1 g total) by mouth 4 (four) times daily -  with meals and at bedtime.   thiamine 100 MG tablet Commonly known as: VITAMIN B1 Take 1 tablet (100 mg total) by mouth daily.        Disposition and follow-up:   Samuel Blevins was discharged from [facility] in Serious condition.  At the hospital follow up visit please address:  1.  Alcohol Withdrawal Seizure: Underwent CIWA protocol with ativan and librium. Also required reduced phenobarbital taper while here. Patient with decision making capacity. Left AMA 04/15/2022. Please provide appropriate substance use counseling resources when he follows up with PCP, Dr. Joya Gaskins.   2. Acute on chronic pancreatitis: seen on imaging. Home medications include creon, unclear if adherent with this. Please ensure he gets refills for creon as needed.  2.  Labs / imaging needed at time of follow-up: CBC, CMP  3.  Pending labs/ test needing follow-up: none  Follow-up Appointments:   Hospital Course by problem list: 1. Alcohol withdrawal seizure: Patient with history of severe alcohol use disorder with multiple hospitalization for alcohol induced pancreatitis and alcohol withdrawal.  He has previously required ICU level of care for this.  Presented after a witnessed seizure at his shelter.  He reports history of  seizures related to alcohol use specifically when he cuts down or stops drinking but unsure when his last seizure was.  His last drink was on the day of admission.  CIWA was consistently remained elevated during hospital course requiring Ativan as needed per CIWA protocol along with Librium taper.  After finishing Librium taper he was still having elevated CIWA scores.  He was placed on a phenobarbital taper per PCCM.   Began to have decision-making capacity on 04/14/2022 but was too weak to leave AMA.  Encouraged him to stay for further care and for physical therapy but he refused both of these.  04/15/2022, physical therapy came by to assess but he refused.  Continued to have decision-making capacity (alert and oriented to person, place, time, reason for admission).  Despite repeat discussions for necessity to stay for further treatment, patient left AMA.  Please provide appropriate substance use counseling resources at follow-up with PCP, Dr. Joya Gaskins.  2.  Acute on chronic alcoholic pancreatitis: Initial imaging revealed acute on chronic alcoholic pancreatitis.  He had minimal pain throughout hospitalization.  Placed him on D5 LR for maintenance and he was eventually able to tolerate p.o. intake without any abdominal pain.  He is on Creon 12,000 units 3 times daily in the outpatient setting.  This was continued during his stay.  He left AMA on 04/15/2022.  Please ensure he gets refills on his Creon as needed.  Discharge Subjective: Earlier this morning, patient was sitting in bed but constantly attempting to get OOB in order to leave. We discussed multiple times about necessity for further medical care. He knows name, location, month/year, and reason for admission. He does not appear to be in alcohol withdrawal at this time.   ADDENDUM: Patient left AMA this afternoon. Had decision making capacity to do so.  Discharge Exam:   BP (!) 150/88   Pulse 99   Temp 98 F (36.7 C) (Axillary)   Resp 18   Ht 5' 9"$  (1.753 m)   Wt 76 kg   SpO2 97%   BMI 24.74 kg/m  Discharge exam:  General: middle aged male with mumbling speech, tremulous, laying in bed attempting to get OOB, NAD. CV: tachycardic rate and regular rhythm, no m/r/g. Pulm: CTABL. Skin: warm and dry. Neuro: AAOx4, moving all extremities spontaneously. Responds appropriately to questions with mumbling speech, likely chronic.   Pertinent Labs, Studies, and  Procedures:     Latest Ref Rng & Units 04/15/2022    7:10 AM 04/14/2022    4:41 AM 04/13/2022    4:26 AM  CBC  WBC 4.0 - 10.5 K/uL 3.8  4.8  4.7   Hemoglobin 13.0 - 17.0 g/dL 11.3  12.1  10.9   Hematocrit 39.0 - 52.0 % 32.0  33.9  33.0   Platelets 150 - 400 K/uL 196  153  129       Latest Ref Rng & Units 04/15/2022    9:02 AM 04/14/2022    4:41 AM 04/13/2022    4:26 AM  CMP  Glucose 70 - 99 mg/dL 96  76  65   BUN 6 - 20 mg/dL 5  5  <5   Creatinine 0.61 - 1.24 mg/dL 0.58  0.72  0.68   Sodium 135 - 145 mmol/L 132  131  130   Potassium 3.5 - 5.1 mmol/L 3.3  3.2  3.4   Chloride 98 - 111 mmol/L 97  95  95   CO2 22 - 32 mmol/L 22  20  18   Calcium 8.9 - 10.3 mg/dL 8.9  9.0  8.7   Total Protein 6.5 - 8.1 g/dL   7.4   Total Bilirubin 0.3 - 1.2 mg/dL   1.8   Alkaline Phos 38 - 126 U/L   117   AST 15 - 41 U/L   86   ALT 0 - 44 U/L   28    Drugs of Abuse     Component Value Date/Time   LABOPIA NONE DETECTED 04/11/2022 0700   COCAINSCRNUR NONE DETECTED 04/11/2022 0700   LABBENZ NONE DETECTED 04/11/2022 0700   AMPHETMU NONE DETECTED 04/11/2022 0700   THCU NONE DETECTED 04/11/2022 0700   LABBARB NONE DETECTED 04/11/2022 0700    Lactic Acid, Venous    Component Value Date/Time   LATICACIDVEN 1.6 04/11/2022 0653   EEG adult  Result Date: 04/11/2022 Lora Havens, MD     04/11/2022  2:28 PM Patient Name: Samuel Blevins MRN: [id number] Epilepsy Attending: Lora Havens Referring Physician/Provider: Lacinda Axon, MD Date: 05/09/2021 Duration: 22.09 mins Patient history: 57 y.o. presenting to the ED after having a seizure. EEG to evaluate for seizure Level of alertness: Awake AEDs during EEG study: Librium, Ativan Technical aspects: This EEG study was done with scalp electrodes positioned according to the 10-20 International system of electrode placement. Electrical activity was reviewed with band pass filter of 1-70Hz$ , sensitivity of 7 uV/mm, display speed of 46m/sec with a 60Hz$   notched filter applied as appropriate. EEG data were recorded continuously and digitally stored.  Video monitoring was available and reviewed as appropriate. Description: The posterior dominant rhythm consists of 8-9 Hz activity of moderate voltage (25-35 uV) seen predominantly in posterior head regions, symmetric and reactive to eye opening and eye closing. There is an excessive amount of 15 to 18 Hz beta activity distributed symmetrically and diffusely. Hyperventilation and photic stimulation were not performed.   Of note, parts of study were difficult due to significant myogenic artifact. ABNORMALITY - Excessive beta, generalized IMPRESSION: This technically difficult study is within normal limits. The excessive beta activity seen in the background is most likely due to the effect of benzodiazepine and is a benign EEG pattern. No seizures or epileptiform discharges were seen throughout the recording. PLora Havens  CT Angio Chest PE W and/or Wo Contrast  Result Date: 04/11/2022 CLINICAL DATA:  Acute, nonlocalized abdominal pain EXAM: CT ANGIOGRAPHY CHEST CT ABDOMEN AND PELVIS WITH CONTRAST TECHNIQUE: Multidetector CT imaging of the chest was performed using the standard protocol during bolus administration of intravenous contrast. Multiplanar CT image reconstructions and MIPs were obtained to evaluate the vascular anatomy. Multidetector CT imaging of the abdomen and pelvis was performed using the standard protocol during bolus administration of intravenous contrast. RADIATION DOSE REDUCTION: This exam was performed according to the departmental dose-optimization program which includes automated exposure control, adjustment of the mA and/or kV according to patient size and/or use of iterative reconstruction technique. CONTRAST:  72mOMNIPAQUE IOHEXOL 350 MG/ML SOLN COMPARISON:  None Available. FINDINGS: CTA CHEST FINDINGS Cardiovascular: Satisfactory opacification of the pulmonary arteries to the  segmental level. No evidence of pulmonary embolism. Normal heart size. No pericardial effusion. Scattered atheromatous calcification of the aorta. Mediastinum/Nodes: Negative for adenopathy or mass. Lungs/Pleura: Generalized airway thickening. Some mucoid impaction in the right lower lobe with linear opacity and volume loss. Clustered nodules in the right upper lobe which has an inflammatory appearance. Musculoskeletal: No acute or aggressive finding. Review of the MIP images confirms the  above findings. CT ABDOMEN and PELVIS FINDINGS Hepatobiliary: Hepatic steatosis.No evidence of biliary obstruction or stone. Pancreas: Expansion and adjacent fat stranding centered at the head. No collection or necrosis. No ductal dilatation or discrete mass Spleen: Unremarkable. Adrenals/Urinary Tract: Negative adrenals. No hydronephrosis or stone. Unremarkable bladder. Stomach/Bowel: Fat inflammation around the duodenal C loop, especially medially. Punctate bubble of extraluminal gas at the third portion is attributed to diverticulum, stable. No bowel obstruction. Vascular/Lymphatic: No acute vascular abnormality. Generalized atheromatous plaque. No mass or adenopathy. Reproductive:Enlarged central prostate projecting into the bladder base. Other: No ascites or pneumoperitoneum. Musculoskeletal: No acute abnormalities. L4-5 and L5-S1 degeneration. Review of the MIP images confirms the above findings. IMPRESSION: Abdominal CT: 1. Inflammation at the pancreaticoduodenal groove which could be acute edematous pancreatitis or peptic ulcer disease. No collection. 2. Severe hepatic steatosis. Chest CTA: 1. Negative for pulmonary embolism. 2. Mucoid impaction in the right lower lobe with mild atelectasis. Mild airspace disease in the right upper lobe. Electronically Signed   By: Jorje Guild M.D.   On: 04/11/2022 07:05   CT ABDOMEN PELVIS W CONTRAST  Result Date: 04/11/2022 CLINICAL DATA:  Acute, nonlocalized abdominal pain EXAM:  CT ANGIOGRAPHY CHEST CT ABDOMEN AND PELVIS WITH CONTRAST TECHNIQUE: Multidetector CT imaging of the chest was performed using the standard protocol during bolus administration of intravenous contrast. Multiplanar CT image reconstructions and MIPs were obtained to evaluate the vascular anatomy. Multidetector CT imaging of the abdomen and pelvis was performed using the standard protocol during bolus administration of intravenous contrast. RADIATION DOSE REDUCTION: This exam was performed according to the departmental dose-optimization program which includes automated exposure control, adjustment of the mA and/or kV according to patient size and/or use of iterative reconstruction technique. CONTRAST:  73m OMNIPAQUE IOHEXOL 350 MG/ML SOLN COMPARISON:  None Available. FINDINGS: CTA CHEST FINDINGS Cardiovascular: Satisfactory opacification of the pulmonary arteries to the segmental level. No evidence of pulmonary embolism. Normal heart size. No pericardial effusion. Scattered atheromatous calcification of the aorta. Mediastinum/Nodes: Negative for adenopathy or mass. Lungs/Pleura: Generalized airway thickening. Some mucoid impaction in the right lower lobe with linear opacity and volume loss. Clustered nodules in the right upper lobe which has an inflammatory appearance. Musculoskeletal: No acute or aggressive finding. Review of the MIP images confirms the above findings. CT ABDOMEN and PELVIS FINDINGS Hepatobiliary: Hepatic steatosis.No evidence of biliary obstruction or stone. Pancreas: Expansion and adjacent fat stranding centered at the head. No collection or necrosis. No ductal dilatation or discrete mass Spleen: Unremarkable. Adrenals/Urinary Tract: Negative adrenals. No hydronephrosis or stone. Unremarkable bladder. Stomach/Bowel: Fat inflammation around the duodenal C loop, especially medially. Punctate bubble of extraluminal gas at the third portion is attributed to diverticulum, stable. No bowel obstruction.  Vascular/Lymphatic: No acute vascular abnormality. Generalized atheromatous plaque. No mass or adenopathy. Reproductive:Enlarged central prostate projecting into the bladder base. Other: No ascites or pneumoperitoneum. Musculoskeletal: No acute abnormalities. L4-5 and L5-S1 degeneration. Review of the MIP images confirms the above findings. IMPRESSION: Abdominal CT: 1. Inflammation at the pancreaticoduodenal groove which could be acute edematous pancreatitis or peptic ulcer disease. No collection. 2. Severe hepatic steatosis. Chest CTA: 1. Negative for pulmonary embolism. 2. Mucoid impaction in the right lower lobe with mild atelectasis. Mild airspace disease in the right upper lobe. Electronically Signed   By: JJorje GuildM.D.   On: 04/11/2022 07:05   CT Head Wo Contrast  Result Date: 04/11/2022 CLINICAL DATA:  Head trauma, moderate to severe. EXAM: CT HEAD WITHOUT CONTRAST CT CERVICAL SPINE WITHOUT  CONTRAST TECHNIQUE: Multidetector CT imaging of the head and cervical spine was performed following the standard protocol without intravenous contrast. Multiplanar CT image reconstructions of the cervical spine were also generated. RADIATION DOSE REDUCTION: This exam was performed according to the departmental dose-optimization program which includes automated exposure control, adjustment of the mA and/or kV according to patient size and/or use of iterative reconstruction technique. COMPARISON:  01/30/2022 FINDINGS: CT HEAD FINDINGS Brain: No evidence of acute infarction, hemorrhage, hydrocephalus, extra-axial collection or mass lesion/mass effect. Vascular: No hyperdense vessel or unexpected calcification. Skull: Normal. Negative for fracture or focal lesion. Sinuses/Orbits: No acute finding. CT CERVICAL SPINE FINDINGS Alignment: No traumatic malalignment. Mild degenerative retrolisthesis at C3-4 and C4-5. Skull base and vertebrae: No acute fracture. No primary bone lesion or focal pathologic process. Soft  tissues and spinal canal: No prevertebral fluid or swelling. No visible canal hematoma. Disc levels: Disc narrowing with endplate and uncovertebral ridging at C3-4 and C4-5 causing biforaminal stenosis. Upper chest: Negative IMPRESSION: No evidence of acute intracranial or cervical spine injury. Electronically Signed   By: Jorje Guild M.D.   On: 04/11/2022 06:57   CT Cervical Spine Wo Contrast  Result Date: 04/11/2022 CLINICAL DATA:  Head trauma, moderate to severe. EXAM: CT HEAD WITHOUT CONTRAST CT CERVICAL SPINE WITHOUT CONTRAST TECHNIQUE: Multidetector CT imaging of the head and cervical spine was performed following the standard protocol without intravenous contrast. Multiplanar CT image reconstructions of the cervical spine were also generated. RADIATION DOSE REDUCTION: This exam was performed according to the departmental dose-optimization program which includes automated exposure control, adjustment of the mA and/or kV according to patient size and/or use of iterative reconstruction technique. COMPARISON:  01/30/2022 FINDINGS: CT HEAD FINDINGS Brain: No evidence of acute infarction, hemorrhage, hydrocephalus, extra-axial collection or mass lesion/mass effect. Vascular: No hyperdense vessel or unexpected calcification. Skull: Normal. Negative for fracture or focal lesion. Sinuses/Orbits: No acute finding. CT CERVICAL SPINE FINDINGS Alignment: No traumatic malalignment. Mild degenerative retrolisthesis at C3-4 and C4-5. Skull base and vertebrae: No acute fracture. No primary bone lesion or focal pathologic process. Soft tissues and spinal canal: No prevertebral fluid or swelling. No visible canal hematoma. Disc levels: Disc narrowing with endplate and uncovertebral ridging at C3-4 and C4-5 causing biforaminal stenosis. Upper chest: Negative IMPRESSION: No evidence of acute intracranial or cervical spine injury. Electronically Signed   By: Jorje Guild M.D.   On: 04/11/2022 06:57   DG Chest Portable  1 View  Result Date: 04/11/2022 CLINICAL DATA:  Short of breath EXAM: PORTABLE CHEST 1 VIEW COMPARISON:  Prior chest x-ray 12/06/2021 FINDINGS: The heart size and mediastinal contours are within normal limits. Atherosclerotic calcifications present in the transverse aorta. Both lungs are clear save for mild chronic bronchitic changes. The visualized skeletal structures are unremarkable. IMPRESSION: No active disease. Electronically Signed   By: Jacqulynn Cadet M.D.   On: 04/11/2022 05:28     Discharge Instructions:  Patient left AMA on 04/15/2022. Had decision making capacity.  Signed: Virl Axe, MD 04/15/2022, 2:13 PM   Pager: [phone]

## 2022-04-15 NOTE — Progress Notes (Signed)
Patient left AMA.  Patient left walking with personal walker (rollator) and personal belongings.
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
## 2022-04-15 NOTE — ED Triage Notes (Signed)
Pt states he fell and hit his head today on the right side when reaching for cigarettes. Pt had just signed himself out AMA from the floor.

## 2022-04-15 NOTE — ED Provider Triage Note (Signed)
Emergency Medicine Provider Triage Evaluation Note  Samuel Blevins , a 57 y.o. male  was evaluated in triage.  Pt complains of head injury. Pt had left AMA the hospital about 1.5 hrs ago after being admitted for alcohol induced pancreatitis and alcohol withdrawal seizures. Says he wanted to smoke and they wouldn't let him. Fell picking up his cigarettes, hitting his head. Happened on [facility] property.   Review of Systems  Positive: Head injury, dizziness Negative:   Physical Exam  BP (!) 85/63 (BP Location: Right Arm)   Pulse (!) 115   Temp 97.8 F (36.6 C) (Oral)   Resp 18   Ht 5' 9"$  (1.753 m)   Wt 75.8 kg   SpO2 96%   BMI 24.66 kg/m  Gen:   Awake, no distress   Resp:  Normal effort  MSK:   Moves extremities without difficulty  Other:  Garbled speech, lethargic, not participating in exam, no focal deficits appreciated  Medical Decision Making  Medically screening exam initiated at 2:28 PM.  Appropriate orders placed.  Samuel Blevins was informed that the remainder of the evaluation will be completed by another provider, this initial triage assessment does not replace that evaluation, and the importance of remaining in the ED until their evaluation is complete.  Case discussed with attending physician Dr Philip Aspen. Will image patient's head but also have great concern for other substance use after leaving hospital. Will obtain labs including UDS and ethanol.    Cathren Sween T, PA-C 04/15/22 1431

## 2022-04-15 NOTE — Evaluation (Signed)
Physical Therapy Evaluation Patient Details Name: Samuel Blevins MRN: [id number] DOB: [birth year] Today's Date: 04/15/2022  History of Present Illness  57 yo male admitted 2/15 after seizure with alcohol withdrawal. PMH: ETOH use disorder, chronic pancreatitis, pancreatic pseudocyst, HTN, chronic anemia and thrombocytopenia, homelessness  Clinical Impression  Pt sitting EOB on arrival with pants on one leg and struggling to don LLE. Pt with increased time grossly 3 min to achieve getting left pants leg donned. Pt unable to don shoes without mod assist despite cues for assist and sequence. Pt fixated on returning [facility] walking today and that he states he lost $200. Pt without awareness of deficits or fall risk, not oriented to day, decreased problem solving and memory. Pt requires min-guard to min assist with all transfers and is a high fall risk. Pt without awareness of deficits or concern for his personal safety with fixation on leaving. Pt with decreased strength, balance, cognition and mobility who would benefit from ST-SNF and 24hr supervision for safety. Pt reports he has gone to SNF in 2007 and will refuse to do it again. Pt not receptive to education or recommendations for improving function. If pt does not leave AMA will benefit from acute therapy to maximize strength, safety, function and gait.         Recommendations for follow up therapy are one component of a multi-disciplinary discharge planning process, led by the attending physician.  Recommendations may be updated based on patient status, additional functional criteria and insurance authorization.  Follow Up Recommendations Skilled nursing-short term rehab (<3 hours/day) Can patient physically be transported by private vehicle: Yes    Assistance Recommended at Discharge Frequent or constant Supervision/Assistance  Patient can return home with the following  A little help with bathing/dressing/bathroom;A little help  with walking and/or transfers;Assistance with cooking/housework;Direct supervision/assist for medications management;Assist for transportation    Equipment Recommendations None recommended by PT  Recommendations for Other Services  OT consult    Functional Status Assessment Patient has had a recent decline in their functional status and/or demonstrates limited ability to make significant improvements in function in a reasonable and predictable amount of time     Precautions / Restrictions Precautions Precautions: Fall      Mobility  Bed Mobility               General bed mobility comments: sitting EOB at foot of bed with all rails up on arrival    Transfers Overall transfer level: Needs assistance   Transfers: Sit to/from Stand Sit to Stand: Min guard           General transfer comment: pt with unsteady rise from bed reliant on bil rails to stand, 3 attempts before able to pull pants up in standing. Unsteady standing to step to rollator. Sitting prematurely end of session at very edge of recliner    Ambulation/Gait Ambulation/Gait assistance: Min guard Gait Distance (Feet): 150 Feet Assistive device: Rollator (4 wheels) Gait Pattern/deviations: Step-through pattern, Decreased stride length, Trunk flexed   Gait velocity interpretation: <1.8 ft/sec, indicate of risk for recurrent falls   General Gait Details: pt with cues for proximity to rollator and direction to room as pt repeatedly trying to go in wrong room  Stairs            Wheelchair Mobility    Modified Rankin (Stroke Patients Only)       Balance Overall balance assessment: Needs assistance   Sitting balance-Leahy Scale: Fair Sitting balance - Comments:  static sitting without support   Standing balance support: Bilateral upper extremity supported, Reliant on assistive device for balance Standing balance-Leahy Scale: Poor Standing balance comment: bil UE on rails or rollator for standing                              Pertinent Vitals/Pain Pain Assessment Pain Assessment: No/denies pain    Home Living Family/patient expects to be discharged to:: Shelter/Homeless                   Additional Comments: pt reports plan to walk back to [facility] today    Prior Function Prior Level of Function : Independent/Modified Independent             Mobility Comments: pt reports using rollator ADLs Comments: pt states he is able to dress and bathe on his own, uses adult briefs periodically     Hand Dominance        Extremity/Trunk Assessment   Upper Extremity Assessment Upper Extremity Assessment: Generalized weakness    Lower Extremity Assessment Lower Extremity Assessment: Generalized weakness (decreased control and gross movement)    Cervical / Trunk Assessment Cervical / Trunk Assessment: Other exceptions Cervical / Trunk Exceptions: rounded shoulders  Communication   Communication: Expressive difficulties  Cognition Arousal/Alertness: Awake/alert Behavior During Therapy: Flat affect Overall Cognitive Status: Impaired/Different from baseline Area of Impairment: Orientation, Attention, Memory, Following commands, Safety/judgement, Awareness, Problem solving                 Orientation Level: Disoriented to, Time Current Attention Level: Sustained Memory: Decreased short-term memory Following Commands: Follows one step commands inconsistently Safety/Judgement: Decreased awareness of safety, Decreased awareness of deficits Awareness: Intellectual Problem Solving: Slow processing, Requires verbal cues, Requires tactile cues, Difficulty sequencing General Comments: pt with lack of awareness for deficits, unable to don shoes without assist, unaware paper scrubbs half off when standing. Pt consistently trying to walk in the wrong room despite education for correct number        General Comments      Exercises     Assessment/Plan     PT Assessment Patient needs continued PT services  PT Problem List Decreased strength;Decreased mobility;Decreased safety awareness;Decreased coordination;Decreased cognition;Decreased balance;Decreased knowledge of use of DME;Decreased activity tolerance       PT Treatment Interventions DME instruction;Therapeutic activities;Cognitive remediation;Therapeutic exercise;Gait training;Patient/family education;Functional mobility training;Balance training    PT Goals (Current goals can be found in the Care Plan section)  Acute Rehab PT Goals Patient Stated Goal: "get out of here today" PT Goal Formulation: With patient Time For Goal Achievement: 04/29/22 Potential to Achieve Goals: Fair    Frequency Min 2X/week     Co-evaluation               AM-PAC PT "6 Clicks" Mobility  Outcome Measure Help needed turning from your back to your side while in a flat bed without using bedrails?: A Little Help needed moving from lying on your back to sitting on the side of a flat bed without using bedrails?: A Little Help needed moving to and from a bed to a chair (including a wheelchair)?: A Little Help needed standing up from a chair using your arms (e.g., wheelchair or bedside chair)?: A Little Help needed to walk in hospital room?: A Little Help needed climbing 3-5 steps with a railing? : Total 6 Click Score: 16    End of Session  Activity Tolerance: Patient tolerated treatment well Patient left: in chair;with call bell/phone within reach;with nursing/sitter in room Nurse Communication: Mobility status PT Visit Diagnosis: Other abnormalities of gait and mobility (R26.89);Muscle weakness (generalized) (M62.81);Difficulty in walking, not elsewhere classified (R26.2)    Time: 1202-1225 PT Time Calculation (min) (ACUTE ONLY): 23 min   Charges:   PT Evaluation $PT Eval Moderate Complexity: 1 Mod          Carr Shartzer P, PT Acute Rehabilitation Services Office:  [phone]   Lamarr Lulas 04/15/2022, 12:37 PM

## 2022-04-16 ENCOUNTER — Observation Stay (HOSPITAL_COMMUNITY)
Admission: EM | Admit: 2022-04-16 | Discharge: 2022-04-18 | Disposition: A | Discharge status: Home / Self Care | Payer: Medicaid Other | Attending: Internal Medicine | Admitting: Internal Medicine

## 2022-04-16 ENCOUNTER — Emergency Department (HOSPITAL_COMMUNITY): Payer: Medicaid Other

## 2022-04-16 ENCOUNTER — Emergency Department (HOSPITAL_COMMUNITY)
Admission: EM | Admit: 2022-04-16 | Discharge: 2022-04-16 | Disposition: A | Discharge status: Home / Self Care | Payer: Medicaid Other | Source: Home / Self Care | Attending: Emergency Medicine | Admitting: Emergency Medicine

## 2022-04-16 ENCOUNTER — Other Ambulatory Visit: Payer: Self-pay

## 2022-04-16 ENCOUNTER — Encounter (HOSPITAL_COMMUNITY): Payer: Self-pay

## 2022-04-16 DIAGNOSIS — Z748 Other problems related to care provider dependency: Secondary | ICD-10-CM

## 2022-04-16 DIAGNOSIS — F17210 Nicotine dependence, cigarettes, uncomplicated: Secondary | ICD-10-CM | POA: Insufficient documentation

## 2022-04-16 DIAGNOSIS — R109 Unspecified abdominal pain: Secondary | ICD-10-CM | POA: Insufficient documentation

## 2022-04-16 DIAGNOSIS — Z5982 Transportation insecurity: Secondary | ICD-10-CM | POA: Insufficient documentation

## 2022-04-16 DIAGNOSIS — R4182 Altered mental status, unspecified: Secondary | ICD-10-CM | POA: Insufficient documentation

## 2022-04-16 DIAGNOSIS — Z79899 Other long term (current) drug therapy: Secondary | ICD-10-CM | POA: Diagnosis not present

## 2022-04-16 DIAGNOSIS — I10 Essential (primary) hypertension: Secondary | ICD-10-CM | POA: Insufficient documentation

## 2022-04-16 DIAGNOSIS — Y9289 Other specified places as the place of occurrence of the external cause: Secondary | ICD-10-CM | POA: Diagnosis not present

## 2022-04-16 DIAGNOSIS — W19XXXA Unspecified fall, initial encounter: Secondary | ICD-10-CM | POA: Diagnosis present

## 2022-04-16 DIAGNOSIS — E871 Hypo-osmolality and hyponatremia: Secondary | ICD-10-CM | POA: Insufficient documentation

## 2022-04-16 DIAGNOSIS — E162 Hypoglycemia, unspecified: Secondary | ICD-10-CM | POA: Insufficient documentation

## 2022-04-16 DIAGNOSIS — F10932 Alcohol use, unspecified with withdrawal with perceptual disturbance: Secondary | ICD-10-CM | POA: Diagnosis not present

## 2022-04-16 DIAGNOSIS — W07XXXA Fall from chair, initial encounter: Secondary | ICD-10-CM | POA: Insufficient documentation

## 2022-04-16 DIAGNOSIS — R5381 Other malaise: Principal | ICD-10-CM | POA: Insufficient documentation

## 2022-04-16 DIAGNOSIS — R569 Unspecified convulsions: Secondary | ICD-10-CM | POA: Diagnosis not present

## 2022-04-16 LAB — CBC WITH DIFFERENTIAL/PLATELET
Abs Immature Granulocytes: 0 10*3/uL (ref 0.00–0.07)
Basophils Absolute: 0.1 10*3/uL (ref 0.0–0.1)
Basophils Relative: 2 %
Eosinophils Absolute: 0 10*3/uL (ref 0.0–0.5)
Eosinophils Relative: 1 %
HCT: 33.1 % — ABNORMAL LOW (ref 39.0–52.0)
Hemoglobin: 10.9 g/dL — ABNORMAL LOW (ref 13.0–17.0)
Lymphocytes Relative: 11 %
Lymphs Abs: 0.5 10*3/uL — ABNORMAL LOW (ref 0.7–4.0)
MCH: 32.3 pg (ref 26.0–34.0)
MCHC: 32.9 g/dL (ref 30.0–36.0)
MCV: 98.2 fL (ref 80.0–100.0)
Monocytes Absolute: 0.6 10*3/uL (ref 0.1–1.0)
Monocytes Relative: 14 %
Neutro Abs: 3.1 10*3/uL (ref 1.7–7.7)
Neutrophils Relative %: 72 %
Platelets: 177 10*3/uL (ref 150–400)
RBC: 3.37 MIL/uL — ABNORMAL LOW (ref 4.22–5.81)
RDW: 15.9 % — ABNORMAL HIGH (ref 11.5–15.5)
WBC: 4.3 10*3/uL (ref 4.0–10.5)
nRBC: 0 % (ref 0.0–0.2)
nRBC: 0 /100 WBC

## 2022-04-16 LAB — COMPREHENSIVE METABOLIC PANEL
ALT: 52 U/L — ABNORMAL HIGH (ref 0–44)
AST: 114 U/L — ABNORMAL HIGH (ref 15–41)
Albumin: 3.1 g/dL — ABNORMAL LOW (ref 3.5–5.0)
Alkaline Phosphatase: 115 U/L (ref 38–126)
Anion gap: 16 — ABNORMAL HIGH (ref 5–15)
BUN: 7 mg/dL (ref 6–20)
CO2: 20 mmol/L — ABNORMAL LOW (ref 22–32)
Calcium: 9.1 mg/dL (ref 8.9–10.3)
Chloride: 97 mmol/L — ABNORMAL LOW (ref 98–111)
Creatinine, Ser: 0.84 mg/dL (ref 0.61–1.24)
GFR, Estimated: >60 mL/min (ref >60)
Glucose, Bld: 69 mg/dL — ABNORMAL LOW (ref 70–99)
Potassium: 3.5 mmol/L (ref 3.5–5.1)
Sodium: 133 mmol/L — ABNORMAL LOW (ref 135–145)
Total Bilirubin: 1.5 mg/dL — ABNORMAL HIGH (ref 0.3–1.2)
Total Protein: 8.2 g/dL — ABNORMAL HIGH (ref 6.5–8.1)

## 2022-04-16 LAB — HEPATIC FUNCTION PANEL
ALT: 53 U/L — ABNORMAL HIGH (ref 0–44)
AST: 119 U/L — ABNORMAL HIGH (ref 15–41)
Albumin: 3.1 g/dL — ABNORMAL LOW (ref 3.5–5.0)
Alkaline Phosphatase: 112 U/L (ref 38–126)
Bilirubin, Direct: 0.4 mg/dL — ABNORMAL HIGH (ref 0.0–0.2)
Indirect Bilirubin: 1 mg/dL — ABNORMAL HIGH (ref 0.3–0.9)
Total Bilirubin: 1.4 mg/dL — ABNORMAL HIGH (ref 0.3–1.2)
Total Protein: 8.2 g/dL — ABNORMAL HIGH (ref 6.5–8.1)

## 2022-04-16 LAB — CBC
HCT: 34.5 % — ABNORMAL LOW (ref 39.0–52.0)
Hemoglobin: 11.1 g/dL — ABNORMAL LOW (ref 13.0–17.0)
MCH: 32.6 pg (ref 26.0–34.0)
MCHC: 32.2 g/dL (ref 30.0–36.0)
MCV: 101.2 fL — ABNORMAL HIGH (ref 80.0–100.0)
Platelets: 178 10*3/uL (ref 150–400)
RBC: 3.41 MIL/uL — ABNORMAL LOW (ref 4.22–5.81)
RDW: 16 % — ABNORMAL HIGH (ref 11.5–15.5)
WBC: 4.8 10*3/uL (ref 4.0–10.5)
nRBC: 0 % (ref 0.0–0.2)

## 2022-04-16 LAB — ETHANOL: Alcohol, Ethyl (B): <10 mg/dL (ref <10)

## 2022-04-16 LAB — LIPASE, BLOOD
Lipase: 136 U/L — ABNORMAL HIGH (ref 11–51)
Lipase: 140 U/L — ABNORMAL HIGH (ref 11–51)

## 2022-04-16 LAB — BASIC METABOLIC PANEL
Anion gap: 16 — ABNORMAL HIGH (ref 5–15)
BUN: 8 mg/dL (ref 6–20)
CO2: 21 mmol/L — ABNORMAL LOW (ref 22–32)
Calcium: 9.2 mg/dL (ref 8.9–10.3)
Chloride: 94 mmol/L — ABNORMAL LOW (ref 98–111)
Creatinine, Ser: 0.72 mg/dL (ref 0.61–1.24)
GFR, Estimated: >60 mL/min (ref >60)
Glucose, Bld: 66 mg/dL — ABNORMAL LOW (ref 70–99)
Potassium: 3.3 mmol/L — ABNORMAL LOW (ref 3.5–5.1)
Sodium: 131 mmol/L — ABNORMAL LOW (ref 135–145)

## 2022-04-16 LAB — RAPID URINE DRUG SCREEN, HOSP PERFORMED
Amphetamines: NOT DETECTED
Barbiturates: POSITIVE — AB
Benzodiazepines: POSITIVE — AB
Cocaine: NOT DETECTED
Opiates: NOT DETECTED
Tetrahydrocannabinol: NOT DETECTED

## 2022-04-16 LAB — AMMONIA: Ammonia: 24 umol/L (ref 9–35)

## 2022-04-16 LAB — CBG MONITORING, ED: Glucose-Capillary: 73 mg/dL (ref 70–99)

## 2022-04-16 MED ORDER — THIAMINE HCL 100 MG/ML IJ SOLN
100.0000 mg | Freq: Every day | INTRAMUSCULAR | Status: DC
  Administered 2022-04-16: 100 mg via INTRAVENOUS
  Filled 2022-04-16: qty 2

## 2022-04-16 MED ORDER — DEXTROSE 50 % IV SOLN
25.0000 mL | Freq: Once | INTRAVENOUS | Status: AC
  Administered 2022-04-16: 25 mL via INTRAVENOUS
  Filled 2022-04-16: qty 50

## 2022-04-16 MED ORDER — LACTATED RINGERS IV BOLUS
1000.0000 mL | Freq: Once | INTRAVENOUS | Status: AC
  Administered 2022-04-16: 1000 mL via INTRAVENOUS

## 2022-04-16 MED ORDER — THIAMINE MONONITRATE 100 MG PO TABS
100.0000 mg | ORAL_TABLET | Freq: Every day | ORAL | Status: DC
  Administered 2022-04-17 – 2022-04-18 (×2): 100 mg via ORAL
  Filled 2022-04-16 (×3): qty 1

## 2022-04-16 MED ORDER — RIVAROXABAN 10 MG PO TABS
10.0000 mg | ORAL_TABLET | Freq: Every day | ORAL | Status: DC
  Administered 2022-04-16 – 2022-04-18 (×3): 10 mg via ORAL
  Filled 2022-04-16 (×3): qty 1

## 2022-04-16 MED ORDER — SUCRALFATE 1 G PO TABS
1.0000 g | ORAL_TABLET | Freq: Three times a day (TID) | ORAL | Status: DC
  Administered 2022-04-16 – 2022-04-18 (×6): 1 g via ORAL
  Filled 2022-04-16 (×6): qty 1

## 2022-04-16 MED ORDER — PANTOPRAZOLE SODIUM 40 MG PO TBEC
40.0000 mg | DELAYED_RELEASE_TABLET | Freq: Two times a day (BID) | ORAL | Status: DC
  Administered 2022-04-17 – 2022-04-18 (×3): 40 mg via ORAL
  Filled 2022-04-16 (×3): qty 1

## 2022-04-16 MED ORDER — ACETAMINOPHEN 650 MG RE SUPP: 650.0000 mg | Freq: Four times a day (QID) | RECTAL | Status: DC | PRN

## 2022-04-16 MED ORDER — ADULT MULTIVITAMIN W/MINERALS CH
1.0000 | ORAL_TABLET | Freq: Every day | ORAL | Status: DC
  Administered 2022-04-16 – 2022-04-18 (×3): 1 via ORAL
  Filled 2022-04-16 (×3): qty 1

## 2022-04-16 MED ORDER — PANCRELIPASE (LIP-PROT-AMYL) 12000-38000 UNITS PO CPEP
12000.0000 [IU] | ORAL_CAPSULE | Freq: Three times a day (TID) | ORAL | Status: DC
  Administered 2022-04-17 – 2022-04-18 (×4): 12000 [IU] via ORAL
  Filled 2022-04-16 (×6): qty 1

## 2022-04-16 MED ORDER — DEXTROSE IN LACTATED RINGERS 5 % IV SOLN: INTRAVENOUS | Status: DC

## 2022-04-16 MED ORDER — POLYETHYLENE GLYCOL 3350 17 G PO PACK: 17.0000 g | PACK | Freq: Every day | ORAL | Status: DC | PRN

## 2022-04-16 MED ORDER — NICOTINE 21 MG/24HR TD PT24
21.0000 mg | MEDICATED_PATCH | Freq: Every day | TRANSDERMAL | Status: DC
  Administered 2022-04-16 – 2022-04-18 (×3): 21 mg via TRANSDERMAL
  Filled 2022-04-16 (×3): qty 1

## 2022-04-16 MED ORDER — FOLIC ACID 1 MG PO TABS
1.0000 mg | ORAL_TABLET | Freq: Every day | ORAL | Status: DC
  Administered 2022-04-16 – 2022-04-18 (×3): 1 mg via ORAL
  Filled 2022-04-16 (×3): qty 1

## 2022-04-16 MED ORDER — DIAZEPAM 5 MG/ML IJ SOLN
10.0000 mg | Freq: Once | INTRAMUSCULAR | Status: AC
  Administered 2022-04-16: 10 mg via INTRAVENOUS
  Filled 2022-04-16: qty 2

## 2022-04-16 MED ORDER — AMLODIPINE BESYLATE 5 MG PO TABS
5.0000 mg | ORAL_TABLET | Freq: Every day | ORAL | Status: DC
  Administered 2022-04-16 – 2022-04-18 (×3): 5 mg via ORAL
  Filled 2022-04-16 (×3): qty 1

## 2022-04-16 MED ORDER — ACETAMINOPHEN 325 MG PO TABS: 650.0000 mg | ORAL_TABLET | Freq: Four times a day (QID) | ORAL | Status: DC | PRN

## 2022-04-16 NOTE — ED Notes (Signed)
Patient transported to CT 

## 2022-04-16 NOTE — ED Notes (Signed)
Pt ordered a house tray

## 2022-04-16 NOTE — H&P (Signed)
Date: 04/16/2022               Patient Name:  Samuel Blevins MRN: [id number]  DOB: 07/31/1965 Age / Sex: 57 y.o., male   PCP: [name], MD         Medical Service: Internal Medicine Teaching Service         Attending Physician: Dr. Aldine Contes, MD    First Contact: Dr. Jodell Cipro Pager: [phone]  Second Contact: Dr. Allyson Sabal Pager: [phone]       After Hours (After 5p/  First Contact Pager: [phone]  weekends / holidays): Second Contact Pager: [phone]   Chief Complaint: Fall  History of Present Illness:   Samuel Blevins is a 57 year old male with alcohol use disorder, alcoholic hepatitis, chronic pancreatitis with pancreatic insufficiency, HTN, chronic anemia, and homelessness who presents after a fall at his shelter. He initially refused admission but was unable to leave on his own and thus is now agreeable to admission. He states that he was at his shelter and was sitting on a chair. He fell asleep and fell down to the ground. He denies any head trauma or LOC. He initially did not want to come to the hospital but someone at the shelter (maybe a friend?) called EMS and they brought him in to the ED. He does note generalized weakness since his recent hospital admission, which is the primary reason for admission. He denies any pain at the time of my encounter.   He was recently admitted for alcohol withdrawal seizure and acute on chronic pancreatitis. He required ativan per CIWA protocol, librium taper, and ultimately a phenobarbital taper. He decided to leave AMA on 04/15/2022 and held decision making capacity to do. His primary issue near the end of his prior stay was generalized weakness/physical deconditioning. We emphasized need to work with PT but he refused to do so at that time.  He does note that he has abstained from alcohol use since leaving the hospital, primarily due to inability to afford alcohol. He has smoked cigarettes since leaving the hospital.   ED  course: CBC with stable anemia. BMP with hypoglycemia (Glu 66), K 3.3, Na 131, HCO3 21, and AG 16. Hepatic function panel with elevated liver enzymes but stable from yesterday. Ethanol level undetectable. All of these labs are relatively stable in comparison to yesterday. T-spine and L-spine films normal. CXR negative. CT head and CT C-spine negative for acute abnormalities. IMTS asked to admit, primarily due to physical deconditioning.   Meds:  No current facility-administered medications on file prior to encounter.   Current Outpatient Medications on File Prior to Encounter  Medication Sig Dispense Refill   acetaminophen (TYLENOL) 325 MG tablet Take 2 tablets (650 mg total) by mouth every 6 (six) hours as needed for fever, moderate pain, mild pain or headache. (Patient not taking: Reported on 02/14/2022) 30 tablet 0   amLODipine (NORVASC) 5 MG tablet Take 1 tablet (5 mg total) by mouth daily. (Patient not taking: Reported on 02/14/2022) 30 tablet 0   clobetasol cream (TEMOVATE) AB-123456789 % Apply 1 Application topically 2 (two) times daily. 30 g 0   folic acid (FOLVITE) 1 MG tablet Take 1 tablet (1 mg total) by mouth daily. 60 tablet 2   Iron, Ferrous Sulfate, 325 (65 Fe) MG TABS Take 1 tablet ( 325 mg) by mouth daily. 60 tablet 2   lipase/protease/amylase (CREON) 12000-38000 units CPEP capsule Take 1 capsule (12,000 Units total) by mouth 3 (three) times  daily before meals. 270 capsule 0   Multiple Vitamin (MULTIVITAMIN WITH MINERALS) TABS tablet Take 1 tablet by mouth daily. 30 tablet 0   ofloxacin (OCUFLOX) 0.3 % ophthalmic solution Place 1 drop into both eyes 4 (four) times daily. 5 mL 0   ondansetron (ZOFRAN) 8 MG tablet Take 1 tablet (8 mg total) by mouth every 6 (six) hours as needed for nausea. 30 tablet 0   pantoprazole (PROTONIX) 40 MG tablet Take 1 tablet (40 mg total) by mouth 2 (two) times daily before a meal. 120 tablet 0   polyethylene glycol (MIRALAX / GLYCOLAX) 17 g packet Take 17 g by  mouth daily as needed for mild constipation or moderate constipation. 14 each 0   senna-docusate (SENOKOT-S) 8.6-50 MG tablet Take 1 tablet by mouth at bedtime as needed for mild constipation. 30 tablet 0   sucralfate (CARAFATE) 1 g tablet Take 1 tablet (1 g total) by mouth 4 (four) times daily -  with meals and at bedtime. 240 tablet 0   thiamine (VITAMIN B1) 100 MG tablet Take 1 tablet (100 mg total) by mouth daily. 100 tablet 0    Allergies: Allergies as of 04/16/2022   (No Known Allergies)   Past Medical History:  Diagnosis Date   Alcohol withdrawal syndrome with complication (HCC)    Alcoholism (HCC)    Elevated AST (SGOT)    Gastrointestinal hemorrhage    Homeless    Hypertension    Pancreatic insufficiency    takes Creon   Symptomatic anemia 11/23/2015   Thrombocytopenia ([facility]) 05/09/2021    Family History:  -no notable family history  Social History:  -stays at [employer] -drinks a pint of liquor daily (last drink 2/14) -smokes half pack of cigarettes for past 20 years -denies recreational drug use -PCP: Dr. Joya Gaskins  Review of Systems: A complete ROS was negative except as per HPI.   Physical Exam: Blood pressure 130/81, pulse 82, temperature 98 F (36.7 C), resp. rate 15, height 5' 9"$  (1.753 m), weight 75.8 kg, SpO2 95 %. General: drowsy middle aged male, laying in bed, NAD. HENT: poor dentition. Dry mucus membranes. CV: normal rate and regular rhythm, no m/r/g. No peripheral edema. Pulm: CTABL, no adventitious sounds noted. Normal WOB on RA. Abdomen: soft, nondistended, nontender. Normoactive bowel sounds. No rebound or guarding. Skin: warm and dry.  Neuro: AAOx4, no focal deficits. Tremulous. Moving all extremities. 3/5 strength in all extremities.  EKG: personally reviewed my interpretation is sinus rhythm  CT Head Wo Contrast  Result Date: 04/16/2022 CLINICAL DATA:  Neck trauma, midline tenderness (Age 93-64y); Mental status change, unknown cause EXAM:  CT HEAD WITHOUT CONTRAST CT CERVICAL SPINE WITHOUT CONTRAST TECHNIQUE: Multidetector CT imaging of the head and cervical spine was performed following the standard protocol without intravenous contrast. Multiplanar CT image reconstructions of the cervical spine were also generated. RADIATION DOSE REDUCTION: This exam was performed according to the departmental dose-optimization program which includes automated exposure control, adjustment of the mA and/or kV according to patient size and/or use of iterative reconstruction technique. COMPARISON:  04/15/2022, 04/11/2022 FINDINGS: CT HEAD FINDINGS Brain: No evidence of acute infarction, hemorrhage, hydrocephalus, extra-axial collection or mass lesion/mass effect. Scattered low-density changes within the periventricular and subcortical white matter compatible with chronic microvascular ischemic change. Mild diffuse cerebral volume loss. Vascular: No hyperdense vessel or unexpected calcification. Skull: Normal. Negative for fracture or focal lesion. Sinuses/Orbits: No acute finding.  Dysconjugate gaze. Other: Negative for scalp hematoma. CT CERVICAL SPINE FINDINGS Alignment:  Facet joints are aligned without dislocation or traumatic listhesis. Dens and lateral masses are aligned. Skull base and vertebrae: No acute fracture. No primary bone lesion or focal pathologic process. Soft tissues and spinal canal: No prevertebral fluid or swelling. No visible canal hematoma. Disc levels:  Multilevel cervical spondylosis, unchanged from prior. Upper chest: Emphysematous changes within the included lung apices. Other: Bilateral carotid atherosclerosis. IMPRESSION: 1. No acute intracranial abnormality. 2. No acute fracture or subluxation of the cervical spine. Electronically Signed   By: Davina Poke D.O.   On: 04/16/2022 13:32   CT Cervical Spine Wo Contrast  Result Date: 04/16/2022 CLINICAL DATA:  Neck trauma, midline tenderness (Age 39-64y); Mental status change, unknown  cause EXAM: CT HEAD WITHOUT CONTRAST CT CERVICAL SPINE WITHOUT CONTRAST TECHNIQUE: Multidetector CT imaging of the head and cervical spine was performed following the standard protocol without intravenous contrast. Multiplanar CT image reconstructions of the cervical spine were also generated. RADIATION DOSE REDUCTION: This exam was performed according to the departmental dose-optimization program which includes automated exposure control, adjustment of the mA and/or kV according to patient size and/or use of iterative reconstruction technique. COMPARISON:  04/15/2022, 04/11/2022 FINDINGS: CT HEAD FINDINGS Brain: No evidence of acute infarction, hemorrhage, hydrocephalus, extra-axial collection or mass lesion/mass effect. Scattered low-density changes within the periventricular and subcortical white matter compatible with chronic microvascular ischemic change. Mild diffuse cerebral volume loss. Vascular: No hyperdense vessel or unexpected calcification. Skull: Normal. Negative for fracture or focal lesion. Sinuses/Orbits: No acute finding.  Dysconjugate gaze. Other: Negative for scalp hematoma. CT CERVICAL SPINE FINDINGS Alignment: Facet joints are aligned without dislocation or traumatic listhesis. Dens and lateral masses are aligned. Skull base and vertebrae: No acute fracture. No primary bone lesion or focal pathologic process. Soft tissues and spinal canal: No prevertebral fluid or swelling. No visible canal hematoma. Disc levels:  Multilevel cervical spondylosis, unchanged from prior. Upper chest: Emphysematous changes within the included lung apices. Other: Bilateral carotid atherosclerosis. IMPRESSION: 1. No acute intracranial abnormality. 2. No acute fracture or subluxation of the cervical spine. Electronically Signed   By: Davina Poke D.O.   On: 04/16/2022 13:32   DG Lumbar Spine Complete  Result Date: 04/16/2022 CLINICAL DATA:  Back pain and cough. EXAM: LUMBAR SPINE - COMPLETE 4+ VIEW COMPARISON:   CT abdomen and pelvis 12/21/2021 FINDINGS: There are tiny ribs versus segmented transverse processes at the ischial bowel body considered L1. Using this numbering, there is mild lumbarization of S1. Minimal levocurvature centered at L3. Severe posterior L5-S1 disc space narrowing with bone-on-bone contact, high-grade endplate sclerosis, and peripheral osteophytes. Mild-to-moderate anterior and mild posterior L4-5 disc space narrowing. L5-S1 greater than L4-5 degenerative vacuum disc. L5-S1 greater than L4-5 facet joint arthropathy. No acute fracture is seen. Moderate atherosclerotic calcifications. IMPRESSION: 1. Transitional lumbosacral anatomy. 2. Severe L5-S1 and mild-to-moderate L4-5 degenerative disc and endplate changes. 3. Moderate atherosclerotic calcifications. Electronically Signed   By: Yvonne Kendall M.D.   On: 04/16/2022 12:02   DG Thoracic Spine 2 View  Result Date: 04/16/2022 CLINICAL DATA:  Back pain.  Cough. EXAM: THORACIC SPINE 2 VIEWS COMPARISON:  AP chest 04/11/2022 FINDINGS: There are 12 rib-bearing thoracic type vertebral bodies. Normal frontal alignment. Vertebral body heights are maintained. Mild-to-moderate multilevel disc space narrowing and anterior endplate osteophytes. No acute fracture is seen. IMPRESSION: Mild-to-moderate multilevel degenerative disc changes. Electronically Signed   By: Yvonne Kendall M.D.   On: 04/16/2022 11:57   DG Chest 1 View  Result Date: 04/16/2022 CLINICAL DATA:  Cough and back pain. Emesis and diarrhea for 3 weeks. EXAM: CHEST  1 VIEW COMPARISON:  Chest radiographs 04/11/2022 and 01/30/2022 FINDINGS: Cardiac silhouette is at the upper limits of normal size. Moderate calcifications are again seen within the aortic arch. Mediastinal contours are grossly within normal limits given mildly decreased lung volumes and mild obliquity on this supine radiograph. No definite acute airspace opacity. No pleural effusion or pneumothorax is seen. Mild multilevel  degenerative disc changes of the thoracic spine. IMPRESSION: No acute cardiopulmonary disease process. Electronically Signed   By: Yvonne Kendall M.D.   On: 04/16/2022 11:55     Assessment & Plan by Problem: Principal Problem:   Fall  Fall 2/2 physical deconditioning Patient with physical deconditioning noted during previous admission as well. He refused to work with PT during last visit. His deconditioning is likely due to chronic alcohol use, chronic malnourishment, and recent hospital stay where his activity was significantly limited due to need for medications to treat alcohol withdrawal (ativan, librium, phenobarbital). We discussed the importance of working with PT to rebuild strength and he does appear to be in agreement with plan. Fortunately all imaging negative for acute abnormalities in the setting of this fall. -PT/OT eval -consult to dietician  -regular diet  Chronic pancreatitis Lipase elevated albeit improved from last admission. Will encourage PO intake given no pain. Will also provide IV rehydration given dry mucus membranes and recent poor PO intake. -regular diet -D5-LR @75cc$ /hr -creon 12000u TID before meals  Alcohol Use Disorder He is AAOx4. Last drink is 2/14 with ethanol level negative today. He is no longer in alcohol withdrawal given out of window. He does have a history of alcohol withdrawal seizures and has required ICU level of care for management in the past. Will continue CIWA without ativan. -CIWA without ativan -thiamine, folate, MVA  GERD Chronic gastritis Complications of chronic alcohol use. Last EGD in 07/2021 showing gastric metaplasia consistent with peptic duodenitis.  -continue PPI BID -continue carafate 1g QID  HTN -norvasc 61m daily   Dispo: Admit patient to Observation with expected length of stay less than 2 midnights.  Signed: JVirl Axe MD 04/16/2022, 6:32 PM  Pager: [phone] 5pm on weekdays and 1pm on weekends: On Call  pager: [phone]

## 2022-04-16 NOTE — ED Notes (Signed)
Pt had BM. [facility]. Brief, bed pad changed.

## 2022-04-16 NOTE — ED Triage Notes (Signed)
Patient reports pain across lower abdomen and low back pain with emesis and diarrhea for 3 weeks , he adds chronic bilateral lower legs pain .

## 2022-04-16 NOTE — Discharge Instructions (Addendum)
Please return at any time he would like to be reevaluated.

## 2022-04-16 NOTE — ED Provider Notes (Signed)
[name]   CSN: [id number] Arrival date & time: 04/16/22  0431     History  Chief Complaint  Patient presents with   Abdominal Pain    Samuel Blevins is a 57 y.o. male.  57 yo M with a chief complaints of wanting a bus pass and something to drink.  He tells me that he was in the hospital and was trying to go home but was never given a bus pass and had difficulty figuring out how to get back to where he lives.  He had a fall on his way out initially and had been checked back into the emergency department and then he has been a bit thirsty as well.  He initially checked and under abdominal discomfort asked about this and he said he is more worried about getting a bus pass and going home.  He is not sure why they drew blood work on him again.   Abdominal Pain      Home Medications Prior to Admission medications   Medication Sig Start Date End Date Taking? Authorizing Provider  acetaminophen (TYLENOL) 325 MG tablet Take 2 tablets (650 mg total) by mouth every 6 (six) hours as needed for fever, moderate pain, mild pain or headache. Patient not taking: Reported on 02/14/2022 12/12/20   Riesa Pope, MD  amLODipine (NORVASC) 5 MG tablet Take 1 tablet (5 mg total) by mouth daily. Patient not taking: Reported on 02/14/2022 12/27/21   [name], MD  clobetasol cream (TEMOVATE) [id number] % Apply 1 Application topically 2 (two) times daily. 03/06/22   [name], MD  folic acid (FOLVITE) 1 MG tablet Take 1 tablet (1 mg total) by mouth daily. 10/17/21   [name], MD  Iron, Ferrous Sulfate, 325 (65 Fe) MG TABS Take 1 tablet ( 325 mg) by mouth daily. 10/17/21   [name], MD  lipase/protease/amylase (CREON) 12000-38000 units CPEP capsule Take 1 capsule (12,000 Units total) by mouth 3 (three) times daily before meals. 12/27/21   [name], MD  Multiple Vitamin (MULTIVITAMIN WITH MINERALS) TABS  tablet Take 1 tablet by mouth daily. 09/27/21   [name], MD  ofloxacin (OCUFLOX) 0.3 % ophthalmic solution Place 1 drop into both eyes 4 (four) times daily. 03/20/22   [name], MD  ondansetron (ZOFRAN) 8 MG tablet Take 1 tablet (8 mg total) by mouth every 6 (six) hours as needed for nausea. 01/23/22   [name], MD  pantoprazole (PROTONIX) 40 MG tablet Take 1 tablet (40 mg total) by mouth 2 (two) times daily before a meal. 12/27/21   [name], MD  polyethylene glycol (MIRALAX / GLYCOLAX) 17 g packet Take 17 g by mouth daily as needed for mild constipation or moderate constipation. 10/07/21   Johny Blamer, DO  senna-docusate (SENOKOT-S) 8.6-50 MG tablet Take 1 tablet by mouth at bedtime as needed for mild constipation. 10/07/21   Johny Blamer, DO  sucralfate (CARAFATE) 1 g tablet Take 1 tablet (1 g total) by mouth 4 (four) times daily -  with meals and at bedtime. 12/27/21   [name], MD  thiamine (VITAMIN B1) 100 MG tablet Take 1 tablet (100 mg total) by mouth daily. 10/02/21   [name], MD      Allergies    Patient has no known allergies.    Review of Systems   Review of Systems  Gastrointestinal:  Positive for  abdominal pain.    Physical Exam Updated Vital Signs BP 94/62 (BP Location: Right Arm)   Pulse (!) 108   Temp 98.2 F (36.8 C) (Oral)   Resp 20   SpO2 99%  Physical Exam Vitals and nursing note reviewed.  Constitutional:      Appearance: He is well-developed.  HENT:     Head: Normocephalic and atraumatic.  Eyes:     Pupils: Pupils are equal, round, and reactive to light.  Neck:     Vascular: No JVD.  Cardiovascular:     Rate and Rhythm: Normal rate and regular rhythm.     Heart sounds: No murmur heard.    No friction rub. No gallop.  Pulmonary:     Effort: No respiratory distress.     Breath sounds: No wheezing.  Abdominal:     General: There is no distension.     Tenderness: There is no abdominal tenderness.  There is no guarding or rebound.  Musculoskeletal:        General: Normal range of motion.     Cervical back: Normal range of motion and neck supple.  Skin:    Coloration: Skin is not pale.     Findings: No rash.  Neurological:     Mental Status: He is alert and oriented to person, place, and time.  Psychiatric:        Behavior: Behavior normal.     ED Results / Procedures / Treatments   Labs (all labs ordered are listed, but only abnormal results are displayed) Labs Reviewed  CBC - Abnormal; Notable for the following components:      Result Value   RBC 3.41 (*)    Hemoglobin 11.1 (*)    HCT 34.5 (*)    MCV 101.2 (*)    RDW 16.0 (*)    All other components within normal limits  LIPASE, BLOOD  COMPREHENSIVE METABOLIC PANEL  URINALYSIS, ROUTINE W REFLEX MICROSCOPIC    EKG None  Radiology CT Head Wo Contrast  Result Date: 04/15/2022 CLINICAL DATA:  Fall head trauma abnormal mental status EXAM: CT HEAD WITHOUT CONTRAST TECHNIQUE: Contiguous axial images were obtained from the base of the skull through the vertex without intravenous contrast. RADIATION DOSE REDUCTION: This exam was performed according to the departmental dose-optimization program which includes automated exposure control, adjustment of the mA and/or kV according to patient size and/or use of iterative reconstruction technique. COMPARISON:  CT brain 04/11/2022, 01/30/2022 FINDINGS: Brain: No acute territorial infarction, hemorrhage or intracranial mass. Mild atrophy. Patchy white matter hypodensity consistent with chronic small vessel ischemic change. Stable ventricle size. Vascular: No hyperdense vessels.  Carotid vascular calcification Skull: Normal. Negative for fracture or focal lesion. Sinuses/Orbits: No acute finding. Other: None IMPRESSION: 1. No CT evidence for acute intracranial abnormality. 2. Atrophy and chronic small vessel ischemic changes of the white matter. Electronically Signed   By: Donavan Foil M.D.    On: 04/15/2022 15:15    Procedures Procedures    Medications Ordered in ED Medications - No data to display  ED Course/ Medical Decision Making/ A&P                             Medical Decision Making Amount and/or Complexity of Data Reviewed Labs: ordered.   57 yo M with a chief complaints of wanting a bus pass and wanting something to drink.  The patient told me that he has been trying to get  back to the shelter which she lives but has been unable to due to transportation.  He tells me if he had a bus pass he would be able to make it there.  That is all that he would like with the exception of something to drink.  I asked him about his abdominal discomfort that he checked in with as well as his leg and back pain.  He tells me has been going on for some time and he does not need evaluated.  He also tells me is not sure whether they drew blood work on him upfront because this already drawn on him twice today.  5:20 AM:  I have discussed the diagnosis/risks/treatment options with the patient.  Evaluation and diagnostic testing in the emergency department does not suggest an emergent condition requiring admission or immediate intervention beyond what has been performed at this time.  They will follow up with PCP. We also discussed returning to the ED immediately if new or worsening sx occur. We discussed the sx which are most concerning (e.g., sudden worsening pain, fever, inability to tolerate by mouth) that necessitate immediate return. Medications administered to the patient during their visit and any new prescriptions provided to the patient are listed below.  Medications given during this visit Medications - No data to display   The patient appears reasonably screen and/or stabilized for discharge and I doubt any other medical condition or other [facility] requiring further screening, evaluation, or treatment in the ED at this time prior to discharge.          Final Clinical  Impression(s) / ED Diagnoses Final diagnoses:  Assistance needed with transportation    Rx / DC Orders ED Discharge Orders     None         Deno Etienne, DO 04/16/22 [phone]

## 2022-04-16 NOTE — ED Notes (Signed)
Patient verbalizes understanding of d/c instructions. Opportunities for questions and answers were provided. Pt d/c from ED and ambulated to lobby.

## 2022-04-16 NOTE — ED Notes (Signed)
Pt reports that he would like to stay in the hospital. Dr. Allyson Sabal made aware that pt would like to stay

## 2022-04-16 NOTE — ED Notes (Signed)
Got patient into a gown on the monitor did EKG shown to er provider patient is resting with call bell in reach

## 2022-04-16 NOTE — ED Notes (Signed)
Per Dr. Tomi Bamberger. If patient leaves let him leave but do not assist patient in leaving AMA.

## 2022-04-16 NOTE — ED Triage Notes (Addendum)
Pt arrived via [facility] from [facility] for a fall outside. Per EMS, pt denies LOC or hitting head. Pt not on blood thinners. Pt doesn't take any meds due to homeless status and not having script or money for meds. Per EMS, pt has cloudy pupils. Pt is A&Ox4. EMS VS: 160/100, p-84, SP02 95% on RA, CBG 98. Per EMS, pt only c/o lower back pain.

## 2022-04-16 NOTE — ED Notes (Signed)
ED TO INPATIENT HANDOFF REPORT  ED Nurse Name and Phone #: Cat [phone]  S Name/Age/Gender Samuel Blevins 57 y.o. male Room/Bed: [id number]  Code Status   Code Status: Full Code  Home/SNF/Other Home Patient oriented to: self, place, time, and situation Is this baseline? Yes   Triage Complete: Triage complete  Chief Complaint Fall [W19.XXXA]  Triage Note Pt arrived via [facility] from [facility] for a fall outside. Per EMS, pt denies LOC or hitting head. Pt not on blood thinners. Pt doesn't take any meds due to homeless status and not having script or money for meds. Per EMS, pt has cloudy pupils. Pt is A&Ox4. EMS VS: 160/100, p-84, SP02 95% on RA, CBG 98. Per EMS, pt only c/o lower back pain.    Allergies No Known Allergies  Level of Care/Admitting Diagnosis ED Disposition     ED Disposition  Admit   Condition  --   Comment  Hospital Area: [place] [[id number]]  Level of Care: Med-Surg [16]  May place patient in observation at [facility] or [place] if equivalent level of care is available:: No  Covid Evaluation: Asymptomatic - no recent exposure (last 10 days) testing not required  Diagnosis: Fall [290176]  Admitting Physician: Aldine Contes [phone]  Attending Physician: Aldine Contes [phone]          B Medical/Surgery History Past Medical History:  Diagnosis Date   Alcohol withdrawal syndrome with complication ([place])    Alcoholism ([place])    Elevated AST (SGOT)    Gastrointestinal hemorrhage    Homeless    Hypertension    Pancreatic insufficiency    takes Creon   Symptomatic anemia 11/23/2015   Thrombocytopenia ([place]) 05/09/2021   Past Surgical History:  Procedure Laterality Date   BIOPSY  06/23/2019   Procedure: BIOPSY;  Surgeon: Lavena Bullion, DO;  Location: [facility];  Service: Gastroenterology;;   BIOPSY  12/12/2020   Procedure: BIOPSY;  Surgeon: Jackquline Denmark, MD;  Location: [facility];  Service:  Endoscopy;;   BIOPSY  08/04/2021   Procedure: BIOPSY;  Surgeon: Daryel November, MD;  Location: [facility];  Service: Gastroenterology;;   ESOPHAGOGASTRODUODENOSCOPY N/A 08/04/2021   Procedure: ESOPHAGOGASTRODUODENOSCOPY (EGD);  Surgeon: Daryel November, MD;  Location: [facility];  Service: Gastroenterology;  Laterality: N/A;   ESOPHAGOGASTRODUODENOSCOPY (EGD) WITH PROPOFOL N/A 06/23/2019   Procedure: ESOPHAGOGASTRODUODENOSCOPY (EGD) WITH PROPOFOL;  Surgeon: Lavena Bullion, DO;  Location: [facility];  Service: Gastroenterology;  Laterality: N/A;   ESOPHAGOGASTRODUODENOSCOPY (EGD) WITH PROPOFOL N/A 12/12/2020   Procedure: ESOPHAGOGASTRODUODENOSCOPY (EGD) WITH PROPOFOL;  Surgeon: Jackquline Denmark, MD;  Location: [facility] ENDOSCOPY;  Service: Endoscopy;  Laterality: N/A;   HOT HEMOSTASIS N/A 06/23/2019   Procedure: HOT HEMOSTASIS (ARGON PLASMA COAGULATION/BICAP);  Surgeon: Lavena Bullion, DO;  Location: [facility] ENDOSCOPY;  Service: Gastroenterology;  Laterality: N/A;     A IV Location/Drains/Wounds Patient Lines/Drains/Airways Status     Active Line/Drains/Airways     Name Placement date Placement time Site Days   Peripheral IV 04/16/22 20 G Right Antecubital 04/16/22  1021  Antecubital  less than 1   External Urinary Catheter 04/16/22  1240  --  less than 1            Intake/Output Last 24 hours  Intake/Output Summary (Last 24 hours) at 04/16/2022 2150 Last data filed at 04/16/2022 1240 Gross per 24 hour  Intake 1000 ml  Output --  Net 1000 ml    Labs/Imaging Results for orders placed or performed  during the hospital encounter of 04/16/22 (from the past 48 hour(s))  Basic metabolic panel     Status: Abnormal   Collection Time: 04/16/22 10:20 AM  Result Value Ref Range   Sodium 131 (L) 135 - 145 mmol/L   Potassium 3.3 (L) 3.5 - 5.1 mmol/L   Chloride 94 (L) 98 - 111 mmol/L   CO2 21 (L) 22 - 32 mmol/L   Glucose, Bld 66 (L) 70 - 99 mg/dL    Comment: Glucose reference  range applies only to samples taken after fasting for at least 8 hours.   BUN 8 6 - 20 mg/dL   Creatinine, Ser 0.72 0.61 - 1.24 mg/dL   Calcium 9.2 8.9 - 10.3 mg/dL   GFR, Estimated >60 >60 mL/min    Comment: (NOTE) Calculated using the CKD-EPI Creatinine Equation (2021)    Anion gap 16 (H) 5 - 15    Comment: Performed at [facility] [address], [place], [place] [address]  CBC with Differential     Status: Abnormal   Collection Time: 04/16/22 10:20 AM  Result Value Ref Range   WBC 4.3 4.0 - 10.5 K/uL   RBC 3.37 (L) 4.22 - 5.81 MIL/uL   Hemoglobin 10.9 (L) 13.0 - 17.0 g/dL   HCT 33.1 (L) 39.0 - 52.0 %   MCV 98.2 80.0 - 100.0 fL   MCH 32.3 26.0 - 34.0 pg   MCHC 32.9 30.0 - 36.0 g/dL   RDW 15.9 (H) 11.5 - 15.5 %   Platelets 177 150 - 400 K/uL   nRBC 0.0 0.0 - 0.2 %   Neutrophils Relative % 72 %   Neutro Abs 3.1 1.7 - 7.7 K/uL   Lymphocytes Relative 11 %   Lymphs Abs 0.5 (L) 0.7 - 4.0 K/uL   Monocytes Relative 14 %   Monocytes Absolute 0.6 0.1 - 1.0 K/uL   Eosinophils Relative 1 %   Eosinophils Absolute 0.0 0.0 - 0.5 K/uL   Basophils Relative 2 %   Basophils Absolute 0.1 0.0 - 0.1 K/uL   nRBC 0 0 /100 WBC   Abs Immature Granulocytes 0.00 0.00 - 0.07 K/uL    Comment: Performed at [facility], [place] [address], [place], [place] [address]  Ethanol     Status: None   Collection Time: 04/16/22 10:20 AM  Result Value Ref Range   Alcohol, Ethyl (B) <10 <10 mg/dL    Comment: (NOTE) Lowest detectable limit for serum alcohol is 10 mg/dL.  For medical purposes only. Performed at [facility], [place] [address], [place], [place] [address]   Hepatic function panel     Status: Abnormal   Collection Time: 04/16/22 10:20 AM  Result Value Ref Range   Total Protein 8.2 (H) 6.5 - 8.1 g/dL   Albumin 3.1 (L) 3.5 - 5.0 g/dL   AST 119 (H) 15 - 41 U/L   ALT 53 (H) 0 - 44 U/L   Alkaline Phosphatase 112 38 - 126 U/L   Total Bilirubin 1.4 (H) 0.3 - 1.2 mg/dL   Bilirubin,  Direct 0.4 (H) 0.0 - 0.2 mg/dL   Indirect Bilirubin 1.0 (H) 0.3 - 0.9 mg/dL    Comment: Performed at [facility] [address], [place], [place] [address]  Lipase, blood     Status: Abnormal   Collection Time: 04/16/22 10:20 AM  Result Value Ref Range   Lipase 140 (H) 11 - 51 U/L    Comment: Performed at  [facility], [place] [address] [place], [place] [address]  Ammonia     Status: None   Collection Time: 04/16/22 10:47 AM  Result Value Ref Range   Ammonia 24 9 - 35 umol/L    Comment: Performed at [facility], [place] [address] [place], [place] [address]  Rapid urine drug screen (hospital performed)     Status: Abnormal   Collection Time: 04/16/22 12:20 PM  Result Value Ref Range   Opiates NONE DETECTED NONE DETECTED   Cocaine NONE DETECTED NONE DETECTED   Benzodiazepines POSITIVE (A) NONE DETECTED   Amphetamines NONE DETECTED NONE DETECTED   Tetrahydrocannabinol NONE DETECTED NONE DETECTED   Barbiturates POSITIVE (A) NONE DETECTED    Comment: (NOTE) DRUG SCREEN FOR MEDICAL PURPOSES ONLY.  IF CONFIRMATION IS NEEDED FOR ANY PURPOSE, NOTIFY LAB WITHIN 5 DAYS.  LOWEST DETECTABLE LIMITS FOR URINE DRUG SCREEN Drug Class                     Cutoff (ng/mL) Amphetamine and metabolites    1000 Barbiturate and metabolites    200 Benzodiazepine                 200 Opiates and metabolites        300 Cocaine and metabolites        300 THC                            50 Performed at [facility], [place] [address] [place], [place] [address]   CBG monitoring, ED     Status: None   Collection Time: 04/16/22  2:27 PM  Result Value Ref Range   Glucose-Capillary 73 70 - 99 mg/dL    Comment: Glucose reference range applies only to samples taken after fasting for at least 8 hours.   CT Head Wo Contrast  Result Date: 04/16/2022 CLINICAL DATA:  Neck trauma, midline tenderness (Age 4-64y); Mental status change, unknown cause EXAM: CT HEAD WITHOUT CONTRAST CT CERVICAL  SPINE WITHOUT CONTRAST TECHNIQUE: Multidetector CT imaging of the head and cervical spine was performed following the standard protocol without intravenous contrast. Multiplanar CT image reconstructions of the cervical spine were also generated. RADIATION DOSE REDUCTION: This exam was performed according to the departmental dose-optimization program which includes automated exposure control, adjustment of the mA and/or kV according to patient size and/or use of iterative reconstruction technique. COMPARISON:  04/15/2022, 04/11/2022 FINDINGS: CT HEAD FINDINGS Brain: No evidence of acute infarction, hemorrhage, hydrocephalus, extra-axial collection or mass lesion/mass effect. Scattered low-density changes within the periventricular and subcortical white matter compatible with chronic microvascular ischemic change. Mild diffuse cerebral volume loss. Vascular: No hyperdense vessel or unexpected calcification. Skull: Normal. Negative for fracture or focal lesion. Sinuses/Orbits: No acute finding.  Dysconjugate gaze. Other: Negative for scalp hematoma. CT CERVICAL SPINE FINDINGS Alignment: Facet joints are aligned without dislocation or traumatic listhesis. Dens and lateral masses are aligned. Skull base and vertebrae: No acute fracture. No primary bone lesion or focal pathologic process. Soft tissues and spinal canal: No prevertebral fluid or swelling. No visible canal hematoma. Disc levels:  Multilevel cervical spondylosis, unchanged from prior. Upper chest: Emphysematous changes within the included lung apices. Other: Bilateral carotid atherosclerosis. IMPRESSION: 1. No acute intracranial abnormality. 2. No acute fracture or subluxation of the cervical spine. Electronically Signed   By: Davina Poke D.O.   On: 04/16/2022 13:32   CT Cervical  Spine Wo Contrast  Result Date: 04/16/2022 CLINICAL DATA:  Neck trauma, midline tenderness (Age 25-64y); Mental status change, unknown cause EXAM: CT HEAD WITHOUT CONTRAST  CT CERVICAL SPINE WITHOUT CONTRAST TECHNIQUE: Multidetector CT imaging of the head and cervical spine was performed following the standard protocol without intravenous contrast. Multiplanar CT image reconstructions of the cervical spine were also generated. RADIATION DOSE REDUCTION: This exam was performed according to the departmental dose-optimization program which includes automated exposure control, adjustment of the mA and/or kV according to patient size and/or use of iterative reconstruction technique. COMPARISON:  04/15/2022, 04/11/2022 FINDINGS: CT HEAD FINDINGS Brain: No evidence of acute infarction, hemorrhage, hydrocephalus, extra-axial collection or mass lesion/mass effect. Scattered low-density changes within the periventricular and subcortical white matter compatible with chronic microvascular ischemic change. Mild diffuse cerebral volume loss. Vascular: No hyperdense vessel or unexpected calcification. Skull: Normal. Negative for fracture or focal lesion. Sinuses/Orbits: No acute finding.  Dysconjugate gaze. Other: Negative for scalp hematoma. CT CERVICAL SPINE FINDINGS Alignment: Facet joints are aligned without dislocation or traumatic listhesis. Dens and lateral masses are aligned. Skull base and vertebrae: No acute fracture. No primary bone lesion or focal pathologic process. Soft tissues and spinal canal: No prevertebral fluid or swelling. No visible canal hematoma. Disc levels:  Multilevel cervical spondylosis, unchanged from prior. Upper chest: Emphysematous changes within the included lung apices. Other: Bilateral carotid atherosclerosis. IMPRESSION: 1. No acute intracranial abnormality. 2. No acute fracture or subluxation of the cervical spine. Electronically Signed   By: Davina Poke D.O.   On: 04/16/2022 13:32   DG Lumbar Spine Complete  Result Date: 04/16/2022 CLINICAL DATA:  Back pain and cough. EXAM: LUMBAR SPINE - COMPLETE 4+ VIEW COMPARISON:  CT abdomen and pelvis 12/21/2021  FINDINGS: There are tiny ribs versus segmented transverse processes at the ischial bowel body considered L1. Using this numbering, there is mild lumbarization of S1. Minimal levocurvature centered at L3. Severe posterior L5-S1 disc space narrowing with bone-on-bone contact, high-grade endplate sclerosis, and peripheral osteophytes. Mild-to-moderate anterior and mild posterior L4-5 disc space narrowing. L5-S1 greater than L4-5 degenerative vacuum disc. L5-S1 greater than L4-5 facet joint arthropathy. No acute fracture is seen. Moderate atherosclerotic calcifications. IMPRESSION: 1. Transitional lumbosacral anatomy. 2. Severe L5-S1 and mild-to-moderate L4-5 degenerative disc and endplate changes. 3. Moderate atherosclerotic calcifications. Electronically Signed   By: Yvonne Kendall M.D.   On: 04/16/2022 12:02   DG Thoracic Spine 2 View  Result Date: 04/16/2022 CLINICAL DATA:  Back pain.  Cough. EXAM: THORACIC SPINE 2 VIEWS COMPARISON:  AP chest 04/11/2022 FINDINGS: There are 12 rib-bearing thoracic type vertebral bodies. Normal frontal alignment. Vertebral body heights are maintained. Mild-to-moderate multilevel disc space narrowing and anterior endplate osteophytes. No acute fracture is seen. IMPRESSION: Mild-to-moderate multilevel degenerative disc changes. Electronically Signed   By: Yvonne Kendall M.D.   On: 04/16/2022 11:57   DG Chest 1 View  Result Date: 04/16/2022 CLINICAL DATA:  Cough and back pain. Emesis and diarrhea for 3 weeks. EXAM: CHEST  1 VIEW COMPARISON:  Chest radiographs 04/11/2022 and 01/30/2022 FINDINGS: Cardiac silhouette is at the upper limits of normal size. Moderate calcifications are again seen within the aortic arch. Mediastinal contours are grossly within normal limits given mildly decreased lung volumes and mild obliquity on this supine radiograph. No definite acute airspace opacity. No pleural effusion or pneumothorax is seen. Mild multilevel degenerative disc changes of the  thoracic spine. IMPRESSION: No acute cardiopulmonary disease process. Electronically Signed   By: Viann Fish.D.  On: 04/16/2022 11:55   CT Head Wo Contrast  Result Date: 04/15/2022 CLINICAL DATA:  Fall head trauma abnormal mental status EXAM: CT HEAD WITHOUT CONTRAST TECHNIQUE: Contiguous axial images were obtained from the base of the skull through the vertex without intravenous contrast. RADIATION DOSE REDUCTION: This exam was performed according to the departmental dose-optimization program which includes automated exposure control, adjustment of the mA and/or kV according to patient size and/or use of iterative reconstruction technique. COMPARISON:  CT brain 04/11/2022, 01/30/2022 FINDINGS: Brain: No acute territorial infarction, hemorrhage or intracranial mass. Mild atrophy. Patchy white matter hypodensity consistent with chronic small vessel ischemic change. Stable ventricle size. Vascular: No hyperdense vessels.  Carotid vascular calcification Skull: Normal. Negative for fracture or focal lesion. Sinuses/Orbits: No acute finding. Other: None IMPRESSION: 1. No CT evidence for acute intracranial abnormality. 2. Atrophy and chronic small vessel ischemic changes of the white matter. Electronically Signed   By: Donavan Foil M.D.   On: 04/15/2022 15:15    Pending Labs Unresulted Labs (From admission, onward)     Start     Ordered   04/17/22 0500  CBC  Tomorrow morning,   R        04/16/22 1828   04/17/22 0500  Comprehensive metabolic panel  Tomorrow morning,   R        04/16/22 1828   04/17/22 0500  Protime-INR  Tomorrow morning,   R        04/16/22 1828   04/17/22 0500  APTT  Tomorrow morning,   R        04/16/22 1828            Vitals/Pain Today's Vitals   04/16/22 1700 04/16/22 1800 04/16/22 1815 04/16/22 2121  BP: 129/81 130/81  110/70  Pulse: (!) 32 82  95  Resp:  15  18  Temp:   98 F (36.7 C)   TempSrc:      SpO2: 96% 95%  95%  Weight:      Height:      PainSc:         Isolation Precautions No active isolations  Medications Medications  rivaroxaban (XARELTO) tablet 10 mg (has no administration in time range)  polyethylene glycol (MIRALAX / GLYCOLAX) packet 17 g (has no administration in time range)  acetaminophen (TYLENOL) tablet 650 mg (has no administration in time range)    Or  acetaminophen (TYLENOL) suppository 650 mg (has no administration in time range)  thiamine (VITAMIN B1) tablet 100 mg (100 mg Oral Not Given 04/16/22 1839)  multivitamin with minerals tablet 1 tablet (1 tablet Oral Given [id number])  folic acid (FOLVITE) tablet 1 mg (1 mg Oral Given 04/16/22 1839)  nicotine (NICODERM CQ - dosed in mg/24 hours) patch 21 mg (21 mg Transdermal Patch Applied 04/16/22 1839)  dextrose 5 % in lactated ringers infusion ( Intravenous New Bag/Given 04/16/22 1933)  lipase/protease/amylase (CREON) capsule 12,000 Units (has no administration in time range)  pantoprazole (PROTONIX) EC tablet 40 mg (has no administration in time range)  sucralfate (CARAFATE) tablet 1 g (has no administration in time range)  amLODipine (NORVASC) tablet 5 mg (5 mg Oral Given 04/16/22 1931)  lactated ringers bolus 1,000 mL (0 mLs Intravenous Stopped 04/16/22 1240)  diazepam (VALIUM) injection 10 mg (10 mg Intravenous Given 04/16/22 1102)  dextrose 50 % solution 25 mL (25 mLs Intravenous Given 04/16/22 1229)    Mobility walks with device     Focused Assessments Pt reports leg weakness  R Recommendations: See Admitting Provider Note  Report given to:   Additional Notes:

## 2022-04-16 NOTE — ED Notes (Signed)
Pt given bus pass and apple juice/crackers.

## 2022-04-16 NOTE — ED Notes (Signed)
Pt had BM. Pericare. bed linen, brief, bed pad, condom catheter changed.

## 2022-04-16 NOTE — Consult Note (Signed)
Paged by Dr. Tomi Bamberger for admission for this gentleman. Briefly, a 57 yo gentleman with alcohol use disorder who was recently admitted to our service for alcohol withdrawal seizure. He left AMA on 04/15/2022 and held decision making capacity at that time. He has had two ED visits since that time and was discharged both times. Presented today to the ED after a fall and brought in by EMS. Dr. Tomi Bamberger asked IMTS to admit due to weakness.   Went to assess patient. However, he refused to answer questions surrounding events. Was able to ask orientation questions and he is oriented to person, place, time (February 2024), and reason for being brought to ED. He is refusing admission and would like to leave. Reassess patient alongside Dr. Tomi Bamberger. Patient states "leave me the f**k alone" and "I will leave on my own". He would be leaving AMA, primarily due to physical deconditioning. Given he has decision making capacity, unable to admit him currently. Relayed this information with Dr. Tomi Bamberger who is in agreement.     Virl Axe, MD Pager: [phone] 04/16/2022, 4:22 PM

## 2022-04-16 NOTE — ED Provider Notes (Signed)
[facility] Provider Note   CSN: [id number] Arrival date & time: 04/16/22  1006     History  Chief Complaint  Patient presents with   Samuel Blevins is a 57 y.o. male.   Fall     Patient has a history of problems including anemia, alcohol dependence syndrome, chronic pain syndrome, pancreatitis, seizures, alcohol withdrawal, homelessness, pancreatic insufficiency, chronic abdominal pain who presents to the ED for evaluation after a fall.  Per the EMS report patient was at [facility].  He had a fall outside.  Patient is complaining of lower back pain.  Patient denies compliance with his home medications because he does not have any many.  Review of his records show patient was recently in the hospital on February 15.  He was admitted for seizures associated with alcohol use.  Patient ended up deciding to leave AMA on February 19 at about 5 PM.  Patient was treated for alcohol withdrawal.  Patient apparently left the hospital and then immediately return to the emergency room after having an episode of feeling dizzy and falling.  Patient with treated with IV fluids.  He still ended up leaving patient ended up getting discharged from the emergency room this morning few hours ago.  Patient apparently then made it back to [facility] where he had this fall  Home Medications Prior to Admission medications   Medication Sig Start Date End Date Taking? Authorizing Provider  acetaminophen (TYLENOL) 325 MG tablet Take 2 tablets (650 mg total) by mouth every 6 (six) hours as needed for fever, moderate pain, mild pain or headache. Patient not taking: Reported on 02/14/2022 12/12/20   Riesa Pope, MD  amLODipine (NORVASC) 5 MG tablet Take 1 tablet (5 mg total) by mouth daily. Patient not taking: Reported on 02/14/2022 12/27/21   [name], MD  clobetasol cream (TEMOVATE) [id number] % Apply 1 Application topically 2 (two) times  daily. 03/06/22   [name], MD  folic acid (FOLVITE) 1 MG tablet Take 1 tablet (1 mg total) by mouth daily. 10/17/21   [name], MD  Iron, Ferrous Sulfate, 325 (65 Fe) MG TABS Take 1 tablet ( 325 mg) by mouth daily. 10/17/21   [name], MD  lipase/protease/amylase (CREON) 12000-38000 units CPEP capsule Take 1 capsule (12,000 Units total) by mouth 3 (three) times daily before meals. 12/27/21   [name], MD  Multiple Vitamin (MULTIVITAMIN WITH MINERALS) TABS tablet Take 1 tablet by mouth daily. 09/27/21   [name], MD  ofloxacin (OCUFLOX) 0.3 % ophthalmic solution Place 1 drop into both eyes 4 (four) times daily. 03/20/22   [name], MD  ondansetron (ZOFRAN) 8 MG tablet Take 1 tablet (8 mg total) by mouth every 6 (six) hours as needed for nausea. 01/23/22   [name], MD  pantoprazole (PROTONIX) 40 MG tablet Take 1 tablet (40 mg total) by mouth 2 (two) times daily before a meal. 12/27/21   [name], MD  polyethylene glycol (MIRALAX / GLYCOLAX) 17 g packet Take 17 g by mouth daily as needed for mild constipation or moderate constipation. 10/07/21   Johny Blamer, DO  senna-docusate (SENOKOT-S) 8.6-50 MG tablet Take 1 tablet by mouth at bedtime as needed for mild constipation. 10/07/21   Johny Blamer, DO  sucralfate (CARAFATE) 1 g tablet Take 1 tablet (1 g total) by mouth 4 (four) times daily -  with meals and  at bedtime. 12/27/21   [name], MD  thiamine (VITAMIN B1) 100 MG tablet Take 1 tablet (100 mg total) by mouth daily. 10/02/21   [name], MD      Allergies    Patient has no known allergies.    Review of Systems   Review of Systems  Physical Exam Updated Vital Signs BP (!) 143/96   Pulse 80   Temp 98 F (36.7 C) (Axillary)   Resp 16   Ht 1.753 m (5' 9"$ )   Wt 75.8 kg   SpO2 98%   BMI 24.68 kg/m  Physical Exam Vitals and nursing note reviewed.  Constitutional:      Appearance: He is well-developed. He  is not diaphoretic.  HENT:     Head: Normocephalic and atraumatic.     Right Ear: External ear normal.     Left Ear: External ear normal.  Eyes:     General: No scleral icterus.       Right eye: No discharge.        Left eye: No discharge.     Conjunctiva/sclera: Conjunctivae normal.  Neck:     Trachea: No tracheal deviation.  Cardiovascular:     Rate and Rhythm: Normal rate and regular rhythm.  Pulmonary:     Effort: Pulmonary effort is normal. No respiratory distress.     Breath sounds: Normal breath sounds. No stridor. No wheezing or rales.  Abdominal:     General: Bowel sounds are normal. There is no distension.     Palpations: Abdomen is soft.     Tenderness: There is no abdominal tenderness. There is no guarding or rebound.  Musculoskeletal:        General: No tenderness or deformity.     Cervical back: Neck supple.  Skin:    General: Skin is warm and dry.     Findings: No rash.  Neurological:     General: No focal deficit present.     Mental Status: He is lethargic.     Cranial Nerves: No cranial nerve deficit, dysarthria or facial asymmetry.     Sensory: No sensory deficit.     Motor: Weakness and tremor present. No abnormal muscle tone or seizure activity.     Coordination: Coordination normal.     Comments: Generalized weakness, patient able to lift his arms and legs but has difficulty doing bones  Psychiatric:        Mood and Affect: Mood normal.     ED Results / Procedures / Treatments   Labs (all labs ordered are listed, but only abnormal results are displayed) Labs Reviewed  BASIC METABOLIC PANEL - Abnormal; Notable for the following components:      Result Value   Sodium 131 (*)    Potassium 3.3 (*)    Chloride 94 (*)    CO2 21 (*)    Glucose, Bld 66 (*)    Anion gap 16 (*)    All other components within normal limits  CBC WITH DIFFERENTIAL/PLATELET - Abnormal; Notable for the following components:   RBC 3.37 (*)    Hemoglobin 10.9 (*)    HCT  33.1 (*)    RDW 15.9 (*)    Lymphs Abs 0.5 (*)    All other components within normal limits  RAPID URINE DRUG SCREEN, HOSP PERFORMED - Abnormal; Notable for the following components:   Benzodiazepines POSITIVE (*)    Barbiturates POSITIVE (*)    All other components within normal limits  HEPATIC FUNCTION PANEL - Abnormal; Notable for the following components:   Total Protein 8.2 (*)    Albumin 3.1 (*)    AST 119 (*)    ALT 53 (*)    Total Bilirubin 1.4 (*)    Bilirubin, Direct 0.4 (*)    Indirect Bilirubin 1.0 (*)    All other components within normal limits  LIPASE, BLOOD - Abnormal; Notable for the following components:   Lipase 140 (*)    All other components within normal limits  ETHANOL  AMMONIA  CBG MONITORING, ED    EKG None  Radiology CT Head Wo Contrast  Result Date: 04/16/2022 CLINICAL DATA:  Neck trauma, midline tenderness (Age 27-64y); Mental status change, unknown cause EXAM: CT HEAD WITHOUT CONTRAST CT CERVICAL SPINE WITHOUT CONTRAST TECHNIQUE: Multidetector CT imaging of the head and cervical spine was performed following the standard protocol without intravenous contrast. Multiplanar CT image reconstructions of the cervical spine were also generated. RADIATION DOSE REDUCTION: This exam was performed according to the departmental dose-optimization program which includes automated exposure control, adjustment of the mA and/or kV according to patient size and/or use of iterative reconstruction technique. COMPARISON:  04/15/2022, 04/11/2022 FINDINGS: CT HEAD FINDINGS Brain: No evidence of acute infarction, hemorrhage, hydrocephalus, extra-axial collection or mass lesion/mass effect. Scattered low-density changes within the periventricular and subcortical white matter compatible with chronic microvascular ischemic change. Mild diffuse cerebral volume loss. Vascular: No hyperdense vessel or unexpected calcification. Skull: Normal. Negative for fracture or focal lesion.  Sinuses/Orbits: No acute finding.  Dysconjugate gaze. Other: Negative for scalp hematoma. CT CERVICAL SPINE FINDINGS Alignment: Facet joints are aligned without dislocation or traumatic listhesis. Dens and lateral masses are aligned. Skull base and vertebrae: No acute fracture. No primary bone lesion or focal pathologic process. Soft tissues and spinal canal: No prevertebral fluid or swelling. No visible canal hematoma. Disc levels:  Multilevel cervical spondylosis, unchanged from prior. Upper chest: Emphysematous changes within the included lung apices. Other: Bilateral carotid atherosclerosis. IMPRESSION: 1. No acute intracranial abnormality. 2. No acute fracture or subluxation of the cervical spine. Electronically Signed   By: Davina Poke D.O.   On: 04/16/2022 13:32   CT Cervical Spine Wo Contrast  Result Date: 04/16/2022 CLINICAL DATA:  Neck trauma, midline tenderness (Age 27-64y); Mental status change, unknown cause EXAM: CT HEAD WITHOUT CONTRAST CT CERVICAL SPINE WITHOUT CONTRAST TECHNIQUE: Multidetector CT imaging of the head and cervical spine was performed following the standard protocol without intravenous contrast. Multiplanar CT image reconstructions of the cervical spine were also generated. RADIATION DOSE REDUCTION: This exam was performed according to the departmental dose-optimization program which includes automated exposure control, adjustment of the mA and/or kV according to patient size and/or use of iterative reconstruction technique. COMPARISON:  04/15/2022, 04/11/2022 FINDINGS: CT HEAD FINDINGS Brain: No evidence of acute infarction, hemorrhage, hydrocephalus, extra-axial collection or mass lesion/mass effect. Scattered low-density changes within the periventricular and subcortical white matter compatible with chronic microvascular ischemic change. Mild diffuse cerebral volume loss. Vascular: No hyperdense vessel or unexpected calcification. Skull: Normal. Negative for fracture or  focal lesion. Sinuses/Orbits: No acute finding.  Dysconjugate gaze. Other: Negative for scalp hematoma. CT CERVICAL SPINE FINDINGS Alignment: Facet joints are aligned without dislocation or traumatic listhesis. Dens and lateral masses are aligned. Skull base and vertebrae: No acute fracture. No primary bone lesion or focal pathologic process. Soft tissues and spinal canal: No prevertebral fluid or swelling. No visible canal hematoma. Disc levels:  Multilevel cervical spondylosis, unchanged from prior. Upper  chest: Emphysematous changes within the included lung apices. Other: Bilateral carotid atherosclerosis. IMPRESSION: 1. No acute intracranial abnormality. 2. No acute fracture or subluxation of the cervical spine. Electronically Signed   By: Davina Poke D.O.   On: 04/16/2022 13:32   DG Lumbar Spine Complete  Result Date: 04/16/2022 CLINICAL DATA:  Back pain and cough. EXAM: LUMBAR SPINE - COMPLETE 4+ VIEW COMPARISON:  CT abdomen and pelvis 12/21/2021 FINDINGS: There are tiny ribs versus segmented transverse processes at the ischial bowel body considered L1. Using this numbering, there is mild lumbarization of S1. Minimal levocurvature centered at L3. Severe posterior L5-S1 disc space narrowing with bone-on-bone contact, high-grade endplate sclerosis, and peripheral osteophytes. Mild-to-moderate anterior and mild posterior L4-5 disc space narrowing. L5-S1 greater than L4-5 degenerative vacuum disc. L5-S1 greater than L4-5 facet joint arthropathy. No acute fracture is seen. Moderate atherosclerotic calcifications. IMPRESSION: 1. Transitional lumbosacral anatomy. 2. Severe L5-S1 and mild-to-moderate L4-5 degenerative disc and endplate changes. 3. Moderate atherosclerotic calcifications. Electronically Signed   By: Yvonne Kendall M.D.   On: 04/16/2022 12:02   DG Thoracic Spine 2 View  Result Date: 04/16/2022 CLINICAL DATA:  Back pain.  Cough. EXAM: THORACIC SPINE 2 VIEWS COMPARISON:  AP chest 04/11/2022  FINDINGS: There are 12 rib-bearing thoracic type vertebral bodies. Normal frontal alignment. Vertebral body heights are maintained. Mild-to-moderate multilevel disc space narrowing and anterior endplate osteophytes. No acute fracture is seen. IMPRESSION: Mild-to-moderate multilevel degenerative disc changes. Electronically Signed   By: Yvonne Kendall M.D.   On: 04/16/2022 11:57   DG Chest 1 View  Result Date: 04/16/2022 CLINICAL DATA:  Cough and back pain. Emesis and diarrhea for 3 weeks. EXAM: CHEST  1 VIEW COMPARISON:  Chest radiographs 04/11/2022 and 01/30/2022 FINDINGS: Cardiac silhouette is at the upper limits of normal size. Moderate calcifications are again seen within the aortic arch. Mediastinal contours are grossly within normal limits given mildly decreased lung volumes and mild obliquity on this supine radiograph. No definite acute airspace opacity. No pleural effusion or pneumothorax is seen. Mild multilevel degenerative disc changes of the thoracic spine. IMPRESSION: No acute cardiopulmonary disease process. Electronically Signed   By: Yvonne Kendall M.D.   On: 04/16/2022 11:55   CT Head Wo Contrast  Result Date: 04/15/2022 CLINICAL DATA:  Fall head trauma abnormal mental status EXAM: CT HEAD WITHOUT CONTRAST TECHNIQUE: Contiguous axial images were obtained from the base of the skull through the vertex without intravenous contrast. RADIATION DOSE REDUCTION: This exam was performed according to the departmental dose-optimization program which includes automated exposure control, adjustment of the mA and/or kV according to patient size and/or use of iterative reconstruction technique. COMPARISON:  CT brain 04/11/2022, 01/30/2022 FINDINGS: Brain: No acute territorial infarction, hemorrhage or intracranial mass. Mild atrophy. Patchy white matter hypodensity consistent with chronic small vessel ischemic change. Stable ventricle size. Vascular: No hyperdense vessels.  Carotid vascular calcification  Skull: Normal. Negative for fracture or focal lesion. Sinuses/Orbits: No acute finding. Other: None IMPRESSION: 1. No CT evidence for acute intracranial abnormality. 2. Atrophy and chronic small vessel ischemic changes of the white matter. Electronically Signed   By: Donavan Foil M.D.   On: 04/15/2022 15:15    Procedures Procedures    Medications Ordered in ED Medications  thiamine (VITAMIN B1) injection 100 mg (100 mg Intravenous Given 04/16/22 1101)  lactated ringers bolus 1,000 mL (0 mLs Intravenous Stopped 04/16/22 1240)  diazepam (VALIUM) injection 10 mg (10 mg Intravenous Given 04/16/22 1102)  dextrose 50 % solution 25  mL (25 mLs Intravenous Given 04/16/22 1229)    ED Course/ Medical Decision Making/ A&P Clinical Course as of 04/16/22 1456  Tue Apr 16, 2022  1224 CBC with Differential(!) CBC shows stable anemia [JK]  [id number] Basic metabolic panel(!) About panel shows hyponatremia but not significantly changed.  Glucose also slightly decreased. [JK]  1224 Hepatic function panel(!) LFTs chronically elevated no significant changes [JK]  1225 Chest x-ray without acute findings.  Spine [JK]  1226  images without acute fracture but significant degenerative disc changes [JK]  1344 ED is positive for benzodiazepines and barbiturates.  Lipase elevated at 140 [JK]  1345 CT and C-spine CT without acute findings. [id number] Patient remains very somnolent.  Will speak to me but [JK]  1455 Case discussed with medical service regarding [JK]    Clinical Course User Index [JK] Dorie Rank, MD                             Medical Decision Making Problems Addressed: Altered mental status, unspecified altered mental status type: acute illness or injury that poses a threat to life or bodily functions Perceptual disturbances and seizures concurrent with and due to alcohol withdrawal [facility]): acute illness or injury  Amount and/or Complexity of Data Reviewed Labs: ordered. Decision-making details  documented in ED Course. Radiology: ordered.  Risk Prescription drug management. Decision regarding hospitalization.   Patient presented to the ED for evaluation of recurrent falls and altered mental status.  She has history of chronic alcohol use disorder.  Patient has recently left the hospital AMA for similar issues.  No signs of any acute injuries associated with his recent fall however patient mains very lethargic and listless.  No seizures witnessed but I am concerned he may have had recurrent seizures and is postictal.  Initially was very tremulous.  Patient was treated with IV benzodiazepines with notable improvement.  Patient is at risk for seizures and withdrawal.  I will consult with the medical service for admission        Final Clinical Impression(s) / ED Diagnoses Final diagnoses:  Altered mental status, unspecified altered mental status type  Perceptual disturbances and seizures concurrent with and due to alcohol withdrawal ([place])  Hyponatremia    Rx / DC Orders ED Discharge Orders     None         Dorie Rank, MD 04/16/22 1456

## 2022-04-16 NOTE — ED Notes (Signed)
Patient transported to X-ray 

## 2022-04-17 DIAGNOSIS — F10939 Alcohol use, unspecified with withdrawal, unspecified: Secondary | ICD-10-CM | POA: Diagnosis not present

## 2022-04-17 DIAGNOSIS — W19XXXA Unspecified fall, initial encounter: Secondary | ICD-10-CM

## 2022-04-17 DIAGNOSIS — K852 Alcohol induced acute pancreatitis without necrosis or infection: Secondary | ICD-10-CM

## 2022-04-17 LAB — CBC
HCT: 31.9 % — ABNORMAL LOW (ref 39.0–52.0)
Hemoglobin: 11.2 g/dL — ABNORMAL LOW (ref 13.0–17.0)
MCH: 32.7 pg (ref 26.0–34.0)
MCHC: 35.1 g/dL (ref 30.0–36.0)
MCV: 93.3 fL (ref 80.0–100.0)
Platelets: 207 10*3/uL (ref 150–400)
RBC: 3.42 MIL/uL — ABNORMAL LOW (ref 4.22–5.81)
RDW: 15.5 % (ref 11.5–15.5)
WBC: 3 10*3/uL — ABNORMAL LOW (ref 4.0–10.5)
nRBC: 0 % (ref 0.0–0.2)

## 2022-04-17 LAB — COMPREHENSIVE METABOLIC PANEL
ALT: 61 U/L — ABNORMAL HIGH (ref 0–44)
AST: 122 U/L — ABNORMAL HIGH (ref 15–41)
Albumin: 2.7 g/dL — ABNORMAL LOW (ref 3.5–5.0)
Alkaline Phosphatase: 107 U/L (ref 38–126)
Anion gap: 10 (ref 5–15)
BUN: <5 mg/dL — ABNORMAL LOW (ref 6–20)
CO2: 25 mmol/L (ref 22–32)
Calcium: 9 mg/dL (ref 8.9–10.3)
Chloride: 97 mmol/L — ABNORMAL LOW (ref 98–111)
Creatinine, Ser: 0.57 mg/dL — ABNORMAL LOW (ref 0.61–1.24)
GFR, Estimated: >60 mL/min (ref >60)
Glucose, Bld: 102 mg/dL — ABNORMAL HIGH (ref 70–99)
Potassium: 3.1 mmol/L — ABNORMAL LOW (ref 3.5–5.1)
Sodium: 132 mmol/L — ABNORMAL LOW (ref 135–145)
Total Bilirubin: 0.9 mg/dL (ref 0.3–1.2)
Total Protein: 7.5 g/dL (ref 6.5–8.1)

## 2022-04-17 LAB — PROTIME-INR
INR: 1.4 — ABNORMAL HIGH (ref 0.8–1.2)
Prothrombin Time: 17.2 seconds — ABNORMAL HIGH (ref 11.4–15.2)

## 2022-04-17 LAB — GLUCOSE, CAPILLARY: Glucose-Capillary: 141 mg/dL — ABNORMAL HIGH (ref 70–99)

## 2022-04-17 LAB — APTT: aPTT: 37 seconds — ABNORMAL HIGH (ref 24–36)

## 2022-04-17 MED ORDER — POTASSIUM CHLORIDE CRYS ER 20 MEQ PO TBCR
40.0000 meq | EXTENDED_RELEASE_TABLET | Freq: Two times a day (BID) | ORAL | Status: AC
  Administered 2022-04-17 (×2): 40 meq via ORAL
  Filled 2022-04-17 (×2): qty 2

## 2022-04-17 NOTE — Progress Notes (Signed)
Internal Medicine Attending:   I saw and examined the patient. I reviewed the resident's note and I agree with the resident's findings and plan as documented in the resident's note.  In brief, patient is a 57 year old male with a past medical history of alcohol use disorder, alcoholic hepatitis, chronic pancreatitis with pancreatic insufficiency, hypertension, chronic anemia who was recently admitted to the hospital for alcohol withdrawal and left AMA on April 15, 2022.  Unfortunately, since leaving the hospital patient has had multiple falls and has been seen in the ED for this.  Yesterday, he was seen again for fall and was admitted for generalized weakness and difficulty ambulating.  Today, patient states that he feels much better and he was able to ambulate with physical therapy who are recommending home health PT currently.  I suspect that his weakness is secondary is multifactorial including recent alcohol withdrawal, nutritional deficiencies, overall poor functional status as well as generalized deconditioning from his hospital stay.  Case was discussed with his outpatient PCP Dr. Joya Gaskins.  Patient should be stable for DC back to [facility] if he continues to do well with physical therapy.  In the meantime, we will supplement his electrolytes, and encourage p.o. intake and continue with his thiamine and folic acid supplementation.  Of note, patient does have a mildly elevated INR as well as a lower albumin which could be reflective of decreased synthetic function secondary to his chronic liver disease.

## 2022-04-17 NOTE — Progress Notes (Signed)
Initial Nutrition Assessment  DOCUMENTATION CODES:   Not applicable  INTERVENTION:   Continue MVI, Folic Acid, Thiamine daily Continue Creon with meals  Carnation Breakfast Essentials TID, each packet mixed with 8 ounces of 2% milk provides 13 grams of protein and 260 calories. Encourage good PO intake   NUTRITION DIAGNOSIS:   Increased nutrient needs related to chronic illness as evidenced by estimated needs.  GOAL:   Patient will meet greater than or equal to 90% of their needs  MONITOR:   PO intake, Supplement acceptance, Labs, Weight trends  REASON FOR ASSESSMENT:   Consult Assessment of nutrition requirement/status, Calorie Count, Poor PO  ASSESSMENT:   57 y.o. male presented to the ED after a fall. Pt left AMA day prior. PMH includes EtOH abuse, chronic pancreatitis, HTN, and homeless. Pt admitted with fall 2/2 physical deconditioning.   RD working remotely at time of assessment.Discussed with RN, pt is eating well. Reports no diarrhea as well today.  Per EMR, pt has had an 8% weight loss within <2 months, this is clinically significant for time frame.   Medications reviewed and include: Folic acid, Creon, MVI, Protonix, Potassium Chloride, Sucralfate, Thiamine Labs reviewed: Sodium 132, Potassium 3.1  NUTRITION - FOCUSED PHYSICAL EXAM:  Deferred to follow-up due to RD working remotely.   Diet Order:   Diet Order             Diet regular Room service appropriate? Yes; Fluid consistency: Thin  Diet effective now                   EDUCATION NEEDS:   No education needs have been identified at this time  Skin:  Skin Assessment: Reviewed RN Assessment  Last BM:  2/20  Height:  Ht Readings from Last 1 Encounters:  04/16/22 5' 9"$  (1.753 m)   Weight:  Wt Readings from Last 1 Encounters:  04/17/22 74.3 kg   Ideal Body Weight:  72.7 kg  BMI:  Body mass index is 24.19 kg/m.  Estimated Nutritional Needs:  Kcal:  2000-2200 Protein:  100-115  grams Fluid:  >/= 2 L   Hermina Barters RD, LDN Clinical Dietitian See [facility] for contact information.
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
## 2022-04-17 NOTE — Evaluation (Signed)
Occupational Therapy Evaluation Patient Details Name: Samuel Blevins MRN: [id number] DOB: Apr 26, 1965 Today's Date: 04/17/2022   History of Present Illness Pt is a 57 y/o M admitted on 04/16/22 after presenting with c/o a fall. Pt was recently admitted for alcohol withdrawal seizure & acute on chronic pancreatitis but left AMA on 04/15/22. PMH: alcohol use disorder, alcoholic hepatitis, chronic pancreatitis with pancreatic insufficiency, HTN, chronic anemia   Clinical Impression   Pt admitted with the above diagnoses and presents with below problem list. Pt will benefit from continued acute OT to address the below listed deficits and maximize independence with basic ADLs prior to d/c. At baseline, pt is mod I with ADLs. Pt currently is mod I with UB ADLs, up to min guard with LB ADLs. Pt walked the unit utilizing his personal rollator at supervision level. Pt endorses some baseline weakness in RLE "I'm trying to get it stronger."        Recommendations for follow up therapy are one component of a multi-disciplinary discharge planning process, led by the attending physician.  Recommendations may be updated based on patient status, additional functional criteria and insurance authorization.   Follow Up Recommendations  No OT follow up     Assistance Recommended at Discharge PRN  Patient can return home with the following Assist for transportation    Functional Status Assessment  Patient has had a recent decline in their functional status and demonstrates the ability to make significant improvements in function in a reasonable and predictable amount of time.  Equipment Recommendations  None recommended by OT    Recommendations for Other Services       Precautions / Restrictions Precautions Precautions: Fall Restrictions Weight Bearing Restrictions: No      Mobility Bed Mobility Overal bed mobility: Modified Independent Bed Mobility: Supine to Sit     Supine to sit: Modified  independent (Device/Increase time), HOB elevated          Transfers Overall transfer level: Needs assistance Equipment used: None Transfers: Sit to/from Stand Sit to Stand: Modified independent (Device/Increase time)           General transfer comment: STS from EOB and to chair without AD without assistance      Balance Overall balance assessment: Needs assistance   Sitting balance-Leahy Scale: Good     Standing balance support: No upper extremity supported, During functional activity Standing balance-Leahy Scale: Fair Standing balance comment: seeks external support when moving outside BOS                           ADL either performed or assessed with clinical judgement   ADL Overall ADL's : Needs assistance/impaired Eating/Feeding: Set up;Sitting   Grooming: Supervision/safety;Standing   Upper Body Bathing: Set up;Sitting   Lower Body Bathing: Min guard;Sit to/from stand   Upper Body Dressing : Set up;Sitting   Lower Body Dressing: Min guard;Sit to/from stand   Toilet Transfer: Supervision/safety;Ambulation;Rollator (4 wheels)   Toileting- Water quality scientist and Hygiene: Min guard;Sit to/from stand   Tub/ Shower Transfer: Walk-in shower;Supervision/safety;Grab bars;Rollator (4 wheels)   Functional mobility during ADLs: Modified independent;Supervision/safety;Rollator (4 wheels) General ADL Comments: uses rollator for walking, transfers without AD     Vision         Perception     Praxis      Pertinent Vitals/Pain Pain Assessment Pain Assessment: Faces Faces Pain Scale: Hurts a little bit Pain Location: back, stomach, BLE Pain Descriptors / Indicators: Discomfort  Pain Intervention(s): Monitored during session     Hand Dominance Right   Extremity/Trunk Assessment Upper Extremity Assessment Upper Extremity Assessment: Generalized weakness   Lower Extremity Assessment Lower Extremity Assessment: Defer to PT evaluation    Cervical / Trunk Assessment Cervical / Trunk Assessment: Other exceptions Cervical / Trunk Exceptions: rounded shoulders   Communication Communication Communication: Other (comment) (low volume)   Cognition Arousal/Alertness: Awake/alert Behavior During Therapy: Flat affect Overall Cognitive Status: No family/caregiver present to determine baseline cognitive functioning Area of Impairment: Memory, Safety/judgement, Awareness, Problem solving, Attention                   Current Attention Level: Sustained Memory: Decreased short-term memory Following Commands: Follows one step commands consistently, Follows one step commands with increased time Safety/Judgement: Decreased awareness of safety, Decreased awareness of deficits Awareness: Anticipatory, Emergent Problem Solving: Slow processing, Decreased initiation, Difficulty sequencing General Comments: Pt with flat affect with minimal communication during session. Follows simple commands throughout session. Demonstrates decreased safety awareness. Reports he wants bed alarm off so he can mobilize in room - PT explained importance of assistance to reduce fall risk. Pt with decreased awareness of safety with mobility with catheter, AD & IV pole.     General Comments  Cigarette butt found in pt's bed, nurse made aware.    Exercises     Shoulder Instructions      Home Living Family/patient expects to be discharged to:: Shelter/Homeless                                        Prior Functioning/Environment Prior Level of Function : Independent/Modified Independent             Mobility Comments: Pt reports he's ambulatory with rollator          OT Problem List: Impaired balance (sitting and/or standing);Decreased knowledge of use of DME or AE;Decreased knowledge of precautions;Decreased strength;Decreased activity tolerance;Decreased safety awareness      OT Treatment/Interventions: Self-care/ADL  training;DME and/or AE instruction;Balance training;Patient/family education;Therapeutic activities    OT Goals(Current goals can be found in the care plan section) Acute Rehab OT Goals Patient Stated Goal: "get my (right) leg stronger" OT Goal Formulation: With patient Time For Goal Achievement: 05/01/22 Potential to Achieve Goals: Good ADL Goals Pt Will Perform Grooming: with modified independence;standing Pt Will Perform Tub/Shower Transfer: with modified independence;ambulating  OT Frequency: Min 2X/week    Co-evaluation PT/OT/SLP Co-Evaluation/Treatment: Yes Reason for Co-Treatment: For patient/therapist safety;Necessary to address cognition/behavior during functional activity PT goals addressed during session: Mobility/safety with mobility;Balance;Proper use of DME OT goals addressed during session: ADL's and self-care      AM-PAC OT "6 Clicks" Daily Activity     Outcome Measure Help from another person eating meals?: None Help from another person taking care of personal grooming?: None Help from another person toileting, which includes using toliet, bedpan, or urinal?: A Little Help from another person bathing (including washing, rinsing, drying)?: A Little Help from another person to put on and taking off regular upper body clothing?: None Help from another person to put on and taking off regular lower body clothing?: None 6 Click Score: 22   End of Session Equipment Utilized During Treatment: Rollator (4 wheels) Nurse Communication: Other (comment) (Cigarette butt found in pt's bed, nurse made aware.)  Activity Tolerance: Patient tolerated treatment well Patient left: in chair;with call bell/phone within reach;with  chair alarm set  OT Visit Diagnosis: Unsteadiness on feet (R26.81);History of falling (Z91.81);Muscle weakness (generalized) (M62.81)                Time: [id number] OT Time Calculation (min): 18 min Charges:  OT General Charges $OT Visit: 1 Visit OT  Evaluation $OT Eval Moderate Complexity: [place], OT Acute Rehabilitation Services Office: [phone]   Hortencia Pilar 04/17/2022, 11:37 AM

## 2022-04-17 NOTE — Evaluation (Signed)
Physical Therapy Evaluation Patient Details Name: Samuel Blevins MRN: [id number] DOB: 1965/08/15 Today's Date: 04/17/2022  History of Present Illness  Pt is a 57 y/o M admitted on 04/16/22 after presenting with c/o a fall. Pt was recently admitted for alcohol withdrawal seizure & acute on chronic pancreatitis but left AMA on 04/15/22. PMH: alcohol use disorder, alcoholic hepatitis, chronic pancreatitis with pancreatic insufficiency, HTN, chronic anemia  Clinical Impression  Pt seen for PT tx with co-tx with OT, pt agreeable. Pt reports prior to admission he was ambulatory with rollator, notes falling out of bed at the shelter. On this date, pt is able to complete bed mobility & STS with mod I, short distance gait in room without AD & supervision, and ambulate 1 lap around nurses station with rollator & supervision. Pt demonstrates decreased safety awareness despite PT educating him on proper use of AD. Will continue to follow pt acutely to address balance, endurance, strengthening & gait with LRAD.     Recommendations for follow up therapy are one component of a multi-disciplinary discharge planning process, led by the attending physician.  Recommendations may be updated based on patient status, additional functional criteria and insurance authorization.  Follow Up Recommendations Home health PT Can patient physically be transported by private vehicle: Yes    Assistance Recommended at Discharge Set up Supervision/Assistance  Patient can return home with the following  A little help with bathing/dressing/bathroom;A little help with walking and/or transfers;Assistance with cooking/housework;Direct supervision/assist for medications management;Assist for transportation;Help with stairs or ramp for entrance    Equipment Recommendations None recommended by PT  Recommendations for Other Services       Functional Status Assessment Patient has had a recent decline in their functional status and/or  demonstrates limited ability to make significant improvements in function in a reasonable and predictable amount of time     Precautions / Restrictions Precautions Precautions: Fall Restrictions Weight Bearing Restrictions: No      Mobility  Bed Mobility Overal bed mobility: Modified Independent Bed Mobility: Supine to Sit     Supine to sit: Modified independent (Device/Increase time), HOB elevated          Transfers Overall transfer level: Needs assistance Equipment used: None Transfers: Sit to/from Stand Sit to Stand: Modified independent (Device/Increase time)           General transfer comment: STS from EOB without AD without assistance    Ambulation/Gait Ambulation/Gait assistance: Supervision Gait Distance (Feet): 100 Feet Assistive device: Rollator (4 wheels) Gait Pattern/deviations: Decreased step length - left, Decreased step length - right, Decreased stride length, Decreased dorsiflexion - right, Decreased dorsiflexion - left Gait velocity: decreased     General Gait Details: Decreased BLE hip/knee flexion during swing phase, decreased heel strike BLE. Pt initially ambulates bed>sink without AD with supervision, uses rollator to ambulate 1 lap around nurses station. When using rollator, pt will push it in front of him & let go of it for a few steps & repeat this process. Pt states he's "trying to get his R leg stronger" with PT educating pt on need to hold to rollator at all times.  Stairs            Wheelchair Mobility    Modified Rankin (Stroke Patients Only)       Balance Overall balance assessment: Needs assistance   Sitting balance-Leahy Scale: Good     Standing balance support: No upper extremity supported, During functional activity Standing balance-Leahy Scale: Good  Pertinent Vitals/Pain Pain Assessment Pain Assessment: Faces Faces Pain Scale: Hurts a little bit Pain Location: back,  stomach, BLE Pain Descriptors / Indicators: Discomfort Pain Intervention(s): Monitored during session    Home Living Family/patient expects to be discharged to:: Shelter/Homeless                        Prior Function Prior Level of Function : Independent/Modified Independent             Mobility Comments: Pt reports he's ambulatory with rollator       Hand Dominance        Extremity/Trunk Assessment   Upper Extremity Assessment Upper Extremity Assessment: Generalized weakness    Lower Extremity Assessment Lower Extremity Assessment: Generalized weakness       Communication   Communication: No difficulties  Cognition Arousal/Alertness: Awake/alert Behavior During Therapy: Flat affect                             Safety/Judgement: Decreased awareness of safety, Decreased awareness of deficits Awareness: Anticipatory, Emergent   General Comments: Pt with flat affect with minimal communication during session. Follows simple commands throughout session. Demonstrates decreased safety awareness. Reports he wants bed alarm off so he can mobilize in room - PT explained importance of assistance to reduce fall risk. Pt with decreased awareness of safety with mobility with catheter, AD & IV pole.        General Comments General comments (skin integrity, edema, etc.): Cigarette butt found in pt's bed, nurse made aware.    Exercises     Assessment/Plan    PT Assessment Patient needs continued PT services  PT Problem List Decreased strength;Decreased mobility;Decreased safety awareness;Decreased coordination;Decreased cognition;Decreased balance;Decreased knowledge of use of DME;Decreased activity tolerance       PT Treatment Interventions DME instruction;Therapeutic activities;Cognitive remediation;Therapeutic exercise;Gait training;Patient/family education;Functional mobility training;Balance training;Neuromuscular re-education;Stair training    PT  Goals (Current goals can be found in the Care Plan section)  Acute Rehab PT Goals Patient Stated Goal: none stated PT Goal Formulation: With patient Time For Goal Achievement: 05/01/22 Potential to Achieve Goals: Good    Frequency Min 3X/week     Co-evaluation PT/OT/SLP Co-Evaluation/Treatment: Yes Reason for Co-Treatment: For patient/therapist safety;Necessary to address cognition/behavior during functional activity PT goals addressed during session: Mobility/safety with mobility;Balance;Proper use of DME         AM-PAC PT "6 Clicks" Mobility  Outcome Measure Help needed turning from your back to your side while in a flat bed without using bedrails?: None Help needed moving from lying on your back to sitting on the side of a flat bed without using bedrails?: A Little Help needed moving to and from a bed to a chair (including a wheelchair)?: A Little Help needed standing up from a chair using your arms (e.g., wheelchair or bedside chair)?: A Little Help needed to walk in hospital room?: A Little Help needed climbing 3-5 steps with a railing? : A Little 6 Click Score: 19    End of Session   Activity Tolerance: Patient tolerated treatment well Patient left: in chair;with chair alarm set;with call bell/phone within reach Nurse Communication: Mobility status PT Visit Diagnosis: Other abnormalities of gait and mobility (R26.89);Muscle weakness (generalized) (M62.81);Difficulty in walking, not elsewhere classified (R26.2);Unsteadiness on feet (R26.81)    Time: [id number] PT Time Calculation (min) (ACUTE ONLY): 19 min   Charges:   PT Evaluation $PT Eval Low Complexity:  Stevenson, DPT 04/17/22, 10:56 AM   Waunita Schooner 04/17/2022, 10:56 AM

## 2022-04-18 ENCOUNTER — Other Ambulatory Visit (HOSPITAL_COMMUNITY): Payer: Self-pay

## 2022-04-18 DIAGNOSIS — K852 Alcohol induced acute pancreatitis without necrosis or infection: Secondary | ICD-10-CM | POA: Diagnosis not present

## 2022-04-18 DIAGNOSIS — F10939 Alcohol use, unspecified with withdrawal, unspecified: Secondary | ICD-10-CM | POA: Diagnosis not present

## 2022-04-18 DIAGNOSIS — W19XXXA Unspecified fall, initial encounter: Secondary | ICD-10-CM | POA: Diagnosis not present

## 2022-04-18 LAB — COMPREHENSIVE METABOLIC PANEL
ALT: 60 U/L — ABNORMAL HIGH (ref 0–44)
AST: 92 U/L — ABNORMAL HIGH (ref 15–41)
Albumin: 2.6 g/dL — ABNORMAL LOW (ref 3.5–5.0)
Alkaline Phosphatase: 98 U/L (ref 38–126)
Anion gap: 12 (ref 5–15)
BUN: 7 mg/dL (ref 6–20)
CO2: 26 mmol/L (ref 22–32)
Calcium: 9.2 mg/dL (ref 8.9–10.3)
Chloride: 95 mmol/L — ABNORMAL LOW (ref 98–111)
Creatinine, Ser: 0.75 mg/dL (ref 0.61–1.24)
GFR, Estimated: >60 mL/min (ref >60)
Glucose, Bld: 105 mg/dL — ABNORMAL HIGH (ref 70–99)
Potassium: 4 mmol/L (ref 3.5–5.1)
Sodium: 133 mmol/L — ABNORMAL LOW (ref 135–145)
Total Bilirubin: 0.6 mg/dL (ref 0.3–1.2)
Total Protein: 6.9 g/dL (ref 6.5–8.1)

## 2022-04-18 LAB — CBC
HCT: 31.6 % — ABNORMAL LOW (ref 39.0–52.0)
Hemoglobin: 10.6 g/dL — ABNORMAL LOW (ref 13.0–17.0)
MCH: 32 pg (ref 26.0–34.0)
MCHC: 33.5 g/dL (ref 30.0–36.0)
MCV: 95.5 fL (ref 80.0–100.0)
Platelets: 218 10*3/uL (ref 150–400)
RBC: 3.31 MIL/uL — ABNORMAL LOW (ref 4.22–5.81)
RDW: 15.4 % (ref 11.5–15.5)
WBC: 3.5 10*3/uL — ABNORMAL LOW (ref 4.0–10.5)
nRBC: 0 % (ref 0.0–0.2)

## 2022-04-18 LAB — PHOSPHORUS: Phosphorus: 2.9 mg/dL (ref 2.5–4.6)

## 2022-04-18 LAB — MAGNESIUM: Magnesium: 1.1 mg/dL — ABNORMAL LOW (ref 1.7–2.4)

## 2022-04-18 MED ORDER — PANTOPRAZOLE SODIUM 40 MG PO TBEC
40.0000 mg | DELAYED_RELEASE_TABLET | Freq: Two times a day (BID) | ORAL | 0 refills | Status: DC
  Filled 2022-04-18: qty 60, 30d supply, fill #0

## 2022-04-18 MED ORDER — MAGNESIUM SULFATE 4 GM/100ML IV SOLN
4.0000 g | Freq: Once | INTRAVENOUS | Status: AC
  Administered 2022-04-18: 4 g via INTRAVENOUS
  Filled 2022-04-18: qty 100

## 2022-04-18 MED ORDER — CREON 12000-38000 UNITS PO CPEP
12000.0000 [IU] | ORAL_CAPSULE | Freq: Three times a day (TID) | ORAL | 0 refills | Status: DC
  Filled 2022-04-18: qty 90, 30d supply, fill #0

## 2022-04-18 MED ORDER — AMLODIPINE BESYLATE 5 MG PO TABS
5.0000 mg | ORAL_TABLET | Freq: Every day | ORAL | 0 refills | Status: DC
  Filled 2022-04-18: qty 30, 30d supply, fill #0

## 2022-04-18 NOTE — Progress Notes (Signed)
Taxi voucher provided to pt for dc to [facility]. Rider waiver complete and on chart.   Wandra Feinstein, MSW, LCSW [phone] (coverage)

## 2022-04-18 NOTE — TOC Transition Note (Signed)
Transition of Care [facility]) - CM/SW Discharge Note   Patient Details  Name: Samuel Blevins MRN: [id number] Date of Birth: Mar 21, 1965  [facility] CM/SW Contact:  Pollie Friar, RN Phone Number: 04/18/2022, 12:45 PM   Clinical Narrative:    Pt is discharging back to [facility]. CSW will provide a cab voucher to get him back to the shelter. CM and pharmacy confirmed pt's insurance is not active. CM has provided a MATCH to assist with the cost of his medications. [facility] will deliver the medications to the room. Outpatient therapy arranged through [facility]. Information on the AVS. Pt can't have Whitney in the shelter.    Final next level of care: Homeless Shelter Barriers to Discharge: No Barriers Identified   Patient Goals and CMS Choice      Discharge Placement                         Discharge Plan and Services Additional resources added to the After Visit Summary for                                       Social Determinants of Health (SDOH) Interventions SDOH Screenings   Food Insecurity: Unknown (04/16/2022)  Housing: Medium Risk (04/17/2022)  Transportation Needs: Unknown (04/17/2022)  Utilities: Not At Risk (04/17/2022)  Depression (PHQ2-9): High Risk (02/14/2022)  Tobacco Use: High Risk (04/16/2022)     Readmission Risk Interventions     No data to display

## 2022-04-18 NOTE — Discharge Summary (Signed)
Name: Samuel Blevins MRN: [id number] DOB: 1965/04/15 57 y.o. PCP: Samuel Stain, MD  Date of Admission: 04/16/2022 10:06 AM Date of Discharge: 04/18/2022 Attending Physician: Dr. Dareen Piano  Discharge Diagnosis: Principal Problem:   Fall    Discharge Medications: Allergies as of 04/18/2022   No Known Allergies      Medication List     STOP taking these medications    Creon 12000-38000 units Cpep capsule Generic drug: lipase/protease/amylase Replaced by: Pancreaze 10500-35500 units Cpep       TAKE these medications    acetaminophen 325 MG tablet Commonly known as: TYLENOL Take 2 tablets (650 mg total) by mouth every 6 (six) hours as needed for fever, moderate pain, mild pain or headache.   amLODipine 5 MG tablet Commonly known as: NORVASC Take 1 tablet (5 mg total) by mouth daily.   clobetasol cream 0.05 % Commonly known as: TEMOVATE Apply 1 Application topically 2 (two) times daily.   folic acid 1 MG tablet Commonly known as: FOLVITE Take 1 tablet (1 mg total) by mouth daily.   Iron (Ferrous Sulfate) 325 (65 Fe) MG Tabs Take 1 tablet ( 325 mg) by mouth daily.   multivitamin with minerals Tabs tablet Take 1 tablet by mouth daily.   ofloxacin 0.3 % ophthalmic solution Commonly known as: Ocuflox Place 1 drop into both eyes 4 (four) times daily.   ondansetron 8 MG tablet Commonly known as: ZOFRAN Take 1 tablet (8 mg total) by mouth every 6 (six) hours as needed for nausea.   Pancreaze 10500-35500 units Cpep Generic drug: Pancrelipase (Lip-Prot-Amyl) Take 1 capsule by mouth 3 (three) times daily before meals. Replaces: Creon 12000-38000 units Cpep capsule   pantoprazole 40 MG tablet Commonly known as: PROTONIX Take 1 tablet (40 mg total) by mouth 2 (two) times daily before a meal.   polyethylene glycol 17 g packet Commonly known as: MIRALAX / GLYCOLAX Take 17 g by mouth daily as needed for mild constipation or moderate constipation.    senna-docusate 8.6-50 MG tablet Commonly known as: Senokot-S Take 1 tablet by mouth at bedtime as needed for mild constipation.   sucralfate 1 g tablet Commonly known as: Carafate Take 1 tablet (1 g total) by mouth 4 (four) times daily -  with meals and at bedtime.   thiamine 100 MG tablet Commonly known as: VITAMIN B1 Take 1 tablet (100 mg total) by mouth daily.        Disposition and follow-up:   Samuel Blevins was discharged from [facility] in Good condition.  At the hospital follow up visit please address:  1.  Follow-up:  a. Creon temporarily changed to similar strength Pancreaze so we could discharge with a 30 day supply. Also discharged with supply of amlodipine and PPI. No other medication changes    b. Please make sure he has initiated outpatient PT if able.  2.  Labs / imaging needed at time of follow-up: cbc, bmp, mag  3.  Pending labs/ test needing follow-up: none  Follow-up Appointments:  Follow-up Samuel Blevins. Schedule an appointment as soon as possible for a visit in 1 week(s).   Specialty: Rehabilitation Contact information: [address] [place] [id number] [place] [facility] Course by problem list: Samuel Blevins is a 57 y.o. with a pertinent PMH of alcohol use disorder, alcoholic hepatitis, chronic pancreatitis with  pancreatic insufficiency, HTN, chronic anemia, and homelessness who presents after a fall at his shelter and admitted for deconditioning.      Fall 2/2 physical deconditioning Patient with physical deconditioning noted during previous admission as well. He refused to work with PT during last visit. His deconditioning is likely due to chronic alcohol use, chronic malnourishment, and recent hospital stay where his activity was significantly limited due to need for medications to treat alcohol withdrawal (ativan,  librium, phenobarbital). Fortunately all imaging negative for acute abnormalities in the setting of this fall. Felt stronger after working with PT.  PT OT recommended home health, thought will not be able to get at his shelter. He will return to [facility] with outpatient PT ref placed, appreciate guidance from Dr. Joya Blevins.   Chronic pancreatitis Lipase elevated albeit improved from last admission. Encouraged PO intake given no pain, stopped fluids as he has been able to take in p.o. intake. Continued Creon.   Alcohol Use Disorder He is AAOx4. Last drink is 2/14 with ethanol level negative. He is no longer in alcohol withdrawal given out of window. He does have a history of alcohol withdrawal seizures and has required ICU level of care for management in the past.  CIWA scores have been unremarkable. No withdrawal complications during this admission.   GERD Chronic gastritis Complications of chronic alcohol use. Last EGD in 07/2021 showing gastric metaplasia consistent with peptic duodenitis. Continued PPI and carafates, can likely benefit from outpatient GI f\u.  HTN Continued amlodipine 5 mg daily.  Subjective: Feels much stronger today. Able to walk without assistance. No withdrawal symptoms. No other acute concerns. Wants to go back to shelter.  Discharge Vitals:   BP 125/71 (BP Location: Left Arm)   Pulse 77   Temp 98 F (36.7 C) (Oral)   Resp 16   Ht '5\' 9"'$  (1.753 m)   Wt 76.1 kg   SpO2 97%   BMI 24.78 kg/m  Discharge exam: Constitutional: NAD Cardiovascular: RRR, no murmurs, rubs or gallops Pulmonary/Chest: normal work of breathing on room air, lungs clear to auscultation bilaterally Abdominal: soft, non-tender, non-distended Skin: warm and dry Extremities: upper/lower extremity pulses 2+, no lower extremity edema present Neuro: Alert and oriented x 3.  No focal deficits.  Moving all extremities spontaneously.   Pertinent Labs, Studies, and Procedures:     Latest Ref  Rng & Units 04/18/2022    4:32 AM 04/17/2022    4:21 AM 04/16/2022   10:20 AM  CBC  WBC 4.0 - 10.5 K/uL 3.5  3.0  4.3   Hemoglobin 13.0 - 17.0 g/dL 10.6  11.2  10.9   Hematocrit 39.0 - 52.0 % 31.6  31.9  33.1   Platelets 150 - 400 K/uL 218  207  177        Latest Ref Rng & Units 04/18/2022    4:32 AM 04/17/2022    4:21 AM 04/16/2022   10:20 AM  CMP  Glucose 70 - 99 mg/dL 105  102  66   BUN 6 - 20 mg/dL 7  <5  8   Creatinine 0.61 - 1.24 mg/dL 0.75  0.57  0.72   Sodium 135 - 145 mmol/L 133  132  131   Potassium 3.5 - 5.1 mmol/L 4.0  3.1  3.3   Chloride 98 - 111 mmol/L 95  97  94   CO2 22 - 32 mmol/L '26  25  21   '$ Calcium 8.9 - 10.3 mg/dL 9.2  9.0  9.2   Total Protein 6.5 - 8.1 g/dL 6.9  7.5  8.2   Total Bilirubin 0.3 - 1.2 mg/dL 0.6  0.9  1.4   Alkaline Phos 38 - 126 U/L 98  107  112   AST 15 - 41 U/L 92  122  119   ALT 0 - 44 U/L 60  61  53     CT Head Wo Contrast  Result Date: 04/16/2022 CLINICAL DATA:  Neck trauma, midline tenderness (Age 37-64y); Mental status change, unknown cause EXAM: CT HEAD WITHOUT CONTRAST CT CERVICAL SPINE WITHOUT CONTRAST TECHNIQUE: Multidetector CT imaging of the head and cervical spine was performed following the standard protocol without intravenous contrast. Multiplanar CT image reconstructions of the cervical spine were also generated. RADIATION DOSE REDUCTION: This exam was performed according to the departmental dose-optimization program which includes automated exposure control, adjustment of the mA and/or kV according to patient size and/or use of iterative reconstruction technique. COMPARISON:  04/15/2022, 04/11/2022 FINDINGS: CT HEAD FINDINGS Brain: No evidence of acute infarction, hemorrhage, hydrocephalus, extra-axial collection or mass lesion/mass effect. Scattered low-density changes within the periventricular and subcortical white matter compatible with chronic microvascular ischemic change. Mild diffuse cerebral volume loss. Vascular: No  hyperdense vessel or unexpected calcification. Skull: Normal. Negative for fracture or focal lesion. Sinuses/Orbits: No acute finding.  Dysconjugate gaze. Other: Negative for scalp hematoma. CT CERVICAL SPINE FINDINGS Alignment: Facet joints are aligned without dislocation or traumatic listhesis. Dens and lateral masses are aligned. Skull base and vertebrae: No acute fracture. No primary bone lesion or focal pathologic process. Soft tissues and spinal canal: No prevertebral fluid or swelling. No visible canal hematoma. Disc levels:  Multilevel cervical spondylosis, unchanged from prior. Upper chest: Emphysematous changes within the included lung apices. Other: Bilateral carotid atherosclerosis. IMPRESSION: 1. No acute intracranial abnormality. 2. No acute fracture or subluxation of the cervical spine. Electronically Signed   By: Davina Poke D.O.   On: 04/16/2022 13:32   CT Cervical Spine Wo Contrast  Result Date: 04/16/2022 CLINICAL DATA:  Neck trauma, midline tenderness (Age 37-64y); Mental status change, unknown cause EXAM: CT HEAD WITHOUT CONTRAST CT CERVICAL SPINE WITHOUT CONTRAST TECHNIQUE: Multidetector CT imaging of the head and cervical spine was performed following the standard protocol without intravenous contrast. Multiplanar CT image reconstructions of the cervical spine were also generated. RADIATION DOSE REDUCTION: This exam was performed according to the departmental dose-optimization program which includes automated exposure control, adjustment of the mA and/or kV according to patient size and/or use of iterative reconstruction technique. COMPARISON:  04/15/2022, 04/11/2022 FINDINGS: CT HEAD FINDINGS Brain: No evidence of acute infarction, hemorrhage, hydrocephalus, extra-axial collection or mass lesion/mass effect. Scattered low-density changes within the periventricular and subcortical white matter compatible with chronic microvascular ischemic change. Mild diffuse cerebral volume loss.  Vascular: No hyperdense vessel or unexpected calcification. Skull: Normal. Negative for fracture or focal lesion. Sinuses/Orbits: No acute finding.  Dysconjugate gaze. Other: Negative for scalp hematoma. CT CERVICAL SPINE FINDINGS Alignment: Facet joints are aligned without dislocation or traumatic listhesis. Dens and lateral masses are aligned. Skull base and vertebrae: No acute fracture. No primary bone lesion or focal pathologic process. Soft tissues and spinal canal: No prevertebral fluid or swelling. No visible canal hematoma. Disc levels:  Multilevel cervical spondylosis, unchanged from prior. Upper chest: Emphysematous changes within the included lung apices. Other: Bilateral carotid atherosclerosis. IMPRESSION: 1. No acute intracranial abnormality. 2. No acute fracture or subluxation of the cervical spine. Electronically Signed   By: Hart Carwin  Plundo D.O.   On: 04/16/2022 13:32   DG Lumbar Spine Complete  Result Date: 04/16/2022 CLINICAL DATA:  Back pain and cough. EXAM: LUMBAR SPINE - COMPLETE 4+ VIEW COMPARISON:  CT abdomen and pelvis 12/21/2021 FINDINGS: There are tiny ribs versus segmented transverse processes at the ischial bowel body considered L1. Using this numbering, there is mild lumbarization of S1. Minimal levocurvature centered at L3. Severe posterior L5-S1 disc space narrowing with bone-on-bone contact, high-grade endplate sclerosis, and peripheral osteophytes. Mild-to-moderate anterior and mild posterior L4-5 disc space narrowing. L5-S1 greater than L4-5 degenerative vacuum disc. L5-S1 greater than L4-5 facet joint arthropathy. No acute fracture is seen. Moderate atherosclerotic calcifications. IMPRESSION: 1. Transitional lumbosacral anatomy. 2. Severe L5-S1 and mild-to-moderate L4-5 degenerative disc and endplate changes. 3. Moderate atherosclerotic calcifications. Electronically Signed   By: Yvonne Kendall M.D.   On: 04/16/2022 12:02   DG Thoracic Spine 2 View  Result Date:  04/16/2022 CLINICAL DATA:  Back pain.  Cough. EXAM: THORACIC SPINE 2 VIEWS COMPARISON:  AP chest 04/11/2022 FINDINGS: There are 12 rib-bearing thoracic type vertebral bodies. Normal frontal alignment. Vertebral body heights are maintained. Mild-to-moderate multilevel disc space narrowing and anterior endplate osteophytes. No acute fracture is seen. IMPRESSION: Mild-to-moderate multilevel degenerative disc changes. Electronically Signed   By: Yvonne Kendall M.D.   On: 04/16/2022 11:57   DG Chest 1 View  Result Date: 04/16/2022 CLINICAL DATA:  Cough and back pain. Emesis and diarrhea for 3 weeks. EXAM: CHEST  1 VIEW COMPARISON:  Chest radiographs 04/11/2022 and 01/30/2022 FINDINGS: Cardiac silhouette is at the upper limits of normal size. Moderate calcifications are again seen within the aortic arch. Mediastinal contours are grossly within normal limits given mildly decreased lung volumes and mild obliquity on this supine radiograph. No definite acute airspace opacity. No pleural effusion or pneumothorax is seen. Mild multilevel degenerative disc changes of the thoracic spine. IMPRESSION: No acute cardiopulmonary disease process. Electronically Signed   By: Yvonne Kendall M.D.   On: 04/16/2022 11:55     Discharge Instructions: Discharge Instructions     Ambulatory referral to Physical Therapy   Complete by: As directed    Call MD for:  difficulty breathing, headache or visual disturbances   Complete by: As directed    Call MD for:  extreme fatigue   Complete by: As directed    Call MD for:  persistant dizziness or light-headedness   Complete by: As directed    Call MD for:  persistant nausea and vomiting   Complete by: As directed    Diet - low sodium heart healthy   Complete by: As directed    Discharge instructions   Complete by: As directed    1. Please continue PT as needed to stay strong and remember to stay hydrated. 2. We are not starting any new meds but will give you a supply of some of  your previously prescribed meds. 3. Please come back to the ED to be re-evaluated if you have a fall and hit your head, lose consciousness, have a seizure, or have severe alcohol withdrawal symptoms.   Increase activity slowly   Complete by: As directed        Discharge Instructions   None     Signed: Linus Galas, MD 04/18/2022, 11:53 AM   Pager: [phone]

## 2022-04-18 NOTE — Progress Notes (Signed)
Explained discharge instructions to patient. Reviewed follow up appointment and next medication administration times. Also reviewed education. Patient verbalized having an understanding for instructions given. All belongings are in the patient's possession to include TOC meds. IV was removed by patient's RN prior to my explaining discharge instructions. Taxi voucher was provided and called. No other needs verbalized. Transported downstairs to the discharge lounge to await pickup for discharge.

## 2022-04-18 NOTE — Congregational Nurse Program (Signed)
  Dept: [phone]   Congregational Nurse Program Note  Date of Encounter: 04/18/2022  Clinic visit after return to [facility] from hospital admission. Reviewed discharge instructions and the additional medications prescribed, Creon 1200-38000, Amlodipine 5 mg and pantoprazole 40 mg.  He voiced understanding of new medications and when to start taking them. Past Medical History: Past Medical History:  Diagnosis Date   Alcohol withdrawal syndrome with complication (HCC)    Alcoholism ([place])    Elevated AST (SGOT)    Gastrointestinal hemorrhage    Homeless    Hypertension    Pancreatic insufficiency    takes Creon   Symptomatic anemia 11/23/2015   Thrombocytopenia ([place]) 05/09/2021    Encounter Details:  CNP Questionnaire - 04/18/22 1500       Questionnaire   Ask client: Do you give verbal consent for me to treat you today? Yes    Student Assistance [employer] Nurse    Location Patient [facility]    Visit Setting with Client Organization    Patient Status Research officer, political party or [place]    Insurance/Financial Assistance Referral N/A    Medication Have Medication Insecurities    Medical Provider Yes    Screening Referrals Made N/A    Medical Referrals Made N/A    Medical Appointment Made N/A    Recently w/o PCP, now 1st time PCP visit completed due to CNs referral or appointment made N/A    Food Have Food Insecurities    Transportation Need transportation assistance    Housing/Utilities No permanent housing    Interpersonal Safety Do not feel safe at current residence    Interventions Advocate/Support;Educate;Reviewed Medications;Counsel    Abnormal to Normal Screening Since Last CN Visit N/A    Screenings CN Performed N/A    Sent Client to Lab for: N/A    Did client attend any of the following based off CNs referral or appointments made? N/A    ED Visit Averted N/A    Life-Saving Intervention Made N/A

## 2022-04-18 NOTE — Plan of Care (Signed)
  Problem: Education: Goal: Knowledge of General Education information will improve Description: Including pain rating scale, medication(s)/side effects and non-pharmacologic comfort measures Outcome: Adequate for Discharge   Problem: Health Behavior/Discharge Planning: Goal: Ability to manage health-related needs will improve Outcome: Adequate for Discharge   Problem: Clinical Measurements: Goal: Ability to maintain clinical measurements within normal limits will improve Outcome: Adequate for Discharge Goal: Will remain free from infection Outcome: Adequate for Discharge Goal: Diagnostic test results will improve Outcome: Adequate for Discharge Goal: Respiratory complications will improve Outcome: Adequate for Discharge Goal: Cardiovascular complication will be avoided Outcome: Adequate for Discharge   Problem: Activity: Goal: Risk for activity intolerance will decrease Outcome: Adequate for Discharge   Problem: Nutrition: Goal: Adequate nutrition will be maintained Outcome: Adequate for Discharge   Problem: Coping: Goal: Level of anxiety will decrease Outcome: Adequate for Discharge   Problem: Elimination: Goal: Will not experience complications related to bowel motility Outcome: Adequate for Discharge Goal: Will not experience complications related to urinary retention Outcome: Adequate for Discharge   Problem: Pain Managment: Goal: General experience of comfort will improve Outcome: Adequate for Discharge   Problem: Safety: Goal: Ability to remain free from injury will improve Outcome: Adequate for Discharge   Problem: Skin Integrity: Goal: Risk for impaired skin integrity will decrease Outcome: Adequate for Discharge   Problem: Acute Rehab PT Goals(only PT should resolve) Goal: Pt Will Transfer Bed To Chair/Chair To Bed Outcome: Adequate for Discharge Goal: Pt Will Ambulate Outcome: Adequate for Discharge Goal: Pt Will Go Up/Down Stairs Outcome: Adequate  for Discharge Goal: Pt/caregiver will Perform Home Exercise Program Outcome: Adequate for Discharge   Problem: Acute Rehab OT Goals (only OT should resolve) Goal: Pt. Will Perform Grooming Outcome: Adequate for Discharge Goal: Pt. Will Perform Tub/Shower Transfer Outcome: Adequate for Discharge   Problem: Increased Nutrient Needs (NI-5.1) Goal: Food and/or nutrient delivery Description: Individualized approach for food/nutrient provision. Outcome: Adequate for Discharge
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
## 2022-04-18 NOTE — Progress Notes (Signed)
Physical Therapy Treatment Patient Details Name: Samuel Blevins MRN: [id number] DOB: [birth year] Today's Date: 04/18/2022   History of Present Illness Pt is a 57 y/o M admitted on 04/16/22 after presenting with c/o a fall. Pt was recently admitted for alcohol withdrawal seizure & acute on chronic pancreatitis but left AMA on 04/15/22. PMH: alcohol use disorder, alcoholic hepatitis, chronic pancreatitis with pancreatic insufficiency, HTN, chronic anemia    PT Comments    Pt with continues progress towards acute goals this session, with focus on functional tranfers and gait for increased activity tolerance and improved balance/postural reactions. Pt seated EOB on arrival and able to transfer to standing without AD and supervision for safety. Pt declining personal rollator use for gait and demonstrating ambulation for hallway distance with no overt LOB and no AD with supervision provided for safety. Pt able to place pad and re-arrange blankets on bed reaching outside BOS in standing with no UE support and no LOB noted. Anticipate safe discharge, with assist level outlined below, once medically cleared, will continue to follow acutely.    Recommendations for follow up therapy are one component of a multi-disciplinary discharge planning process, led by the attending physician.  Recommendations may be updated based on patient status, additional functional criteria and insurance authorization.  Follow Up Recommendations  Home health PT Can patient physically be transported by private vehicle: Yes   Assistance Recommended at Discharge Set up Supervision/Assistance  Patient can return home with the following A little help with bathing/dressing/bathroom;A little help with walking and/or transfers;Assistance with cooking/housework;Direct supervision/assist for medications management;Assist for transportation;Help with stairs or ramp for entrance   Equipment Recommendations  None recommended by PT     Recommendations for Other Services       Precautions / Restrictions Precautions Precautions: Fall Restrictions Weight Bearing Restrictions: No     Mobility  Bed Mobility Overal bed mobility: Modified Independent             General bed mobility comments: seated EOB pre and post session    Transfers Overall transfer level: Needs assistance Equipment used: None Transfers: Sit to/from Stand Sit to Stand: Modified independent (Device/Increase time)           General transfer comment: STS from EOB without LOB    Ambulation/Gait Ambulation/Gait assistance: Supervision Gait Distance (Feet): 500 Feet Assistive device: None Gait Pattern/deviations: Step-through pattern, Decreased stride length Gait velocity: WFL     General Gait Details: no overt LOB noted, some lateral sway   Stairs             Wheelchair Mobility    Modified Rankin (Stroke Patients Only)       Balance Overall balance assessment: Needs assistance   Sitting balance-Leahy Scale: Good     Standing balance support: No upper extremity supported, During functional activity Standing balance-Leahy Scale: Fair Standing balance comment: able to lay out blankets on bed and bedpad without UE support and without LOB                            Cognition Arousal/Alertness: Awake/alert Behavior During Therapy: Flat affect Overall Cognitive Status: No family/caregiver present to determine baseline cognitive functioning Area of Impairment: Memory, Safety/judgement, Awareness, Problem solving, Attention                   Current Attention Level: Sustained Memory: Decreased short-term memory Following Commands: Follows one step commands consistently, Follows one step commands with increased time  Safety/Judgement: Decreased awareness of safety, Decreased awareness of deficits Awareness: Anticipatory, Emergent Problem Solving: Slow processing, Decreased initiation, Difficulty  sequencing General Comments: Pt with flat affect. Demonstrate some decreased safety awareness.        Exercises      General Comments        Pertinent Vitals/Pain Pain Assessment Pain Assessment: No/denies pain Pain Intervention(s): Monitored during session    Home Living                          Prior Function            PT Goals (current goals can now be found in the care plan section) Acute Rehab PT Goals PT Goal Formulation: With patient Time For Goal Achievement: 05/01/22 Progress towards PT goals: Progressing toward goals    Frequency    Min 3X/week      PT Plan      Co-evaluation   Reason for Co-Treatment: For patient/therapist safety;Necessary to address cognition/behavior during functional activity   OT goals addressed during session: ADL's and self-care      AM-PAC PT "6 Clicks" Mobility   Outcome Measure  Help needed turning from your back to your side while in a flat bed without using bedrails?: None Help needed moving from lying on your back to sitting on the side of a flat bed without using bedrails?: A Little Help needed moving to and from a bed to a chair (including a wheelchair)?: A Little Help needed standing up from a chair using your arms (e.g., wheelchair or bedside chair)?: A Little Help needed to walk in hospital room?: A Little Help needed climbing 3-5 steps with a railing? : A Little 6 Click Score: 19    End of Session   Activity Tolerance: Patient tolerated treatment well Patient left: in bed;with call bell/phone within reach (seated EOB) Nurse Communication: Mobility status PT Visit Diagnosis: Other abnormalities of gait and mobility (R26.89);Muscle weakness (generalized) (M62.81);Difficulty in walking, not elsewhere classified (R26.2);Unsteadiness on feet (R26.81)     Time: 1005-1017 PT Time Calculation (min) (ACUTE ONLY): 12 min  Charges:  $Therapeutic Activity: 8-22 mins                     Lluvia Gwynne R.  PTA Acute Rehabilitation Services Office: [place] 04/18/2022, 10:31 AM

## 2022-04-22 ENCOUNTER — Telehealth: Payer: Self-pay

## 2022-04-22 NOTE — Transitions of Care (Post Inpatient/ED Visit) (Signed)
   04/22/2022  Name: Samuel Blevins MRN: [id number] DOB: 1965/10/09  Today's TOC FU Call Status: Unsuccessful Call (1st Attempt) Date: 04/22/22  Attempted to reach the patient regarding the most recent Inpatient/ED visit.  Follow Up Plan: Additional outreach attempts will be made to reach the patient to complete the Transitions of Care (Post Inpatient/ED visit) call.   Signature Eden Lathe, RN

## 2022-04-23 ENCOUNTER — Telehealth: Payer: Self-pay

## 2022-04-23 NOTE — Telephone Encounter (Signed)
noted 

## 2022-04-23 NOTE — Transitions of Care (Post Inpatient/ED Visit) (Signed)
   04/23/2022  Name: Samuel Blevins MRN: [id number] DOB: 08-Dec-1965  Today's TOC FU Call Status: Today's TOC FU Call Status:: Unsuccessful Call (2nd Attempt) Unsuccessful Call (1st Attempt) Date: 04/22/22 Unsuccessful Call (2nd Attempt) Date: 04/23/22  Attempted to reach the patient regarding the most recent Inpatient/ED visit.  Follow Up Plan: Additional outreach attempts will be made to reach the patient to complete the Transitions of Care (Post Inpatient/ED visit) call.   Signature Eden Lathe, RN

## 2022-04-23 NOTE — Telephone Encounter (Signed)
I tried calling him back and the recording still states the call cannot be completed at this time.     I scheduled him with Dr Joya Gaskins at [facility] tomorrow, 2/28 @ 10:30am and I spoke to [facility] who will arrange transportation for him.  Glenard Haring is requesting bubble packs for his medications and I told her that we can have prescriptions sent to [facility] going forward and they will bubble pack.

## 2022-04-23 NOTE — Congregational Nurse Program (Signed)
  Dept: [phone]   Congregational Nurse Program Note  Date of Encounter: 04/23/2022  Clinic visit for complaint of fall on 2/26 with small raised area right side of scalp occipital region that is tender when touched. No bleeding or open area.  No dizziness, no nausea, no visual changes per his report but was incontinent of urine during clinic visit.. BP 129/73, pulse 96 and irregular, O2 Sat 95%.    Resident had missed follow-up appointment scheduled this morning from hospitalization discharge on 2/22, communicated with case manager at MD office. Appointment made for MD visit in AM, transportation for appointment arranged by [facility] staff. Past Medical History: Past Medical History:  Diagnosis Date   Alcohol withdrawal syndrome with complication (HCC)    Alcoholism ([place])    Elevated AST (SGOT)    Gastrointestinal hemorrhage    Homeless    Hypertension    Pancreatic insufficiency    takes Creon   Symptomatic anemia 11/23/2015   Thrombocytopenia ([place]) 05/09/2021    Encounter Details:  CNP Questionnaire - 04/23/22 1200       Questionnaire   Ask client: Do you give verbal consent for me to treat you today? Yes    Student Assistance N/A    Location Patient [facility]    Visit Setting with Client Organization    Patient Status Research officer, political party or [place]    Insurance/Financial Assistance Referral N/A    Medication Have Medication Insecurities    Medical Provider Yes    Screening Referrals Made N/A    Medical Referrals Made [facility] P[facility]    Medical Appointment Made [facility] PCP/clinic    Recently w/o PCP, now 1st time PCP visit completed due to CNs referral or appointment made N/A    Food Have Food Insecurities    Transportation Need transportation assistance    Housing/Utilities No permanent housing    Interpersonal Safety Do not feel safe at current residence    Interventions Advocate/Support;Educate;Reviewed Medications;Counsel;Navigate  Healthcare System;Case Management    Abnormal to Normal Screening Since Last CN Visit N/A    Screenings CN Performed Blood Pressure;Pulse Ox    Sent Client to Lab for: N/A    Did client attend any of the following based off CNs referral or appointments made? N/A    ED Visit Averted Yes    Life-Saving Intervention Made N/A

## 2022-04-23 NOTE — Telephone Encounter (Signed)
Pt is returning a missed phone call that he received. Please call pt back.

## 2022-04-24 ENCOUNTER — Telehealth: Payer: Self-pay

## 2022-04-24 ENCOUNTER — Ambulatory Visit: Payer: Commercial Managed Care - HMO | Admitting: Critical Care Medicine

## 2022-04-24 NOTE — Progress Notes (Deleted)
Established Patient Office Visit  Subjective   Patient ID: Samuel Blevins, male    DOB: [birth year]  Age: 57 y.o. MRN: [id number]  Nausea vomiting  08/02/21 This is a 57 year old male who has had previous history of pancreatic pseudocyst pancreatitis and severe alcoholism.  He has been drinking a pint of vodka daily.  He is at the [employer]  We brought him into the health and wellness office today to get labs and further examine him.  He had abdominal pain in the mid epigastric area that is worsening.  He has been vomiting coffee-ground material with occasional small amounts of blood.  He has had some dark melanotic stools.  The patient has been hospitalized in March of this year but never really had gastroenterology follow-up.  I tried to engage him at the shelter at least twice in the last 2 months he finally engaged with me and came to the office today for examination and emergency labs.  In the process of the stat labs being drawn we had his medications refilled that he had not been taking.  The labs came back positive for a lipase of greater than 1200 and have been in the low-grade 100 range.  Hemoglobin is about the same 7-1/2 platelet count is actually up his liver function actually is remarkably not any worse.  Because he has had a pancreatic pseudocyst I believe he needs CT imaging urgently and we cannot accomplish this as an outpatient particular given the fact he lives in a homeless shelter  We contacted the emergency room triage nurse at [facility], ER and told him we will be sending him over and actually he is going over to the emergency room from the homeless shelter.  He is stable at this time is not hemodynamically unstable and is not septic.  It would be best if he could have gastroenterology see him during this admission and perform an upper endoscopy as well.  Note the patient needs a rollator to walk with his alcoholism he has had significant alcoholic  neuropathy.    12/21 Patient seen for transition of care post hospital visit he had been seen previously at the homeless shelter for such same visit.  He has history of severe alcoholism recently fell had a partial fracture of C7 nondisplaced.  He has alcoholic pancreatitis and as well steatosis also has gastritis.  He has chronic abdominal pain he has bowel movements that are frequent with some blood in the stool and occasionally has emesis with blood in the stool.  Comes into the office very shaky has not had any drink today he has been shaking and jerking some.  He is not adherent with all his medications as listed.  Below is a copy of the discharge summary to which he left AMA. Physician Discharge Summary   Samuel Blevins SHN:[id number] DOB: 30-Dec-1965 DOA: 01/30/2022   PCP: [name], MD   Admit date: 01/30/2022 Discharge date: 02/01/2022 LEFT AMA     Admitted From: home Disposition:  home Discharging physician: Dwyane Dee, MD   Recommendations for Outpatient Follow-up:  1. Patient instructed to follow-up with neurosurgery regarding c-collar      Hospital Course: Samuel Blevins is a 57 yo male with PMH alcohol misuse disorder, chronic pancreatitis, pancreatic pseudocyst, chronic pancreatic insufficiency, hypertension, chronic anemia and thrombocytopenia, homelessness who presented to the ED after a fall.  He had also reported an episode of bloody emesis and dark stools prior to admission.  He has had prior EGDs during similar evaluations for hematemesis. He does continue to consume alcohol and resides at [facility]. Hemoglobin is stable and at baseline on admission.  GI was consulted for further evaluation.   GI bleed workup was placed on hold in setting of his ensuing alcohol withdrawal. He required escalation up to Precedex drip along with phenobarb taper. Despite use with Ativan and Haldol as well, he continued to have ongoing withdrawal symptoms. He was deemed to have  capacity and was insistent on leaving AMA multiple times. Risks and benefits were discussed prior to patient leaving and he still insisted on leaving.  He was given contact information for [place] neurosurgery for following up regarding his suspected C7 fracture.  Cervical collar in place at time of discharge. Despite trying to encourage patient to remain in the hospital, he still chose to leave AMA.   Assessment and Plan: * GI bleed - Prior history noted similar with hematemesis.  He also describes dark stools and has not been able to have outpt colonoscopy due to homelessness - continues to consume alcohol - last EGD June 2023 noted with peptic duodenitis and reactive gastropathy with mild chronic gastritis -Currently hemoglobin stable.  Continue trending; no further episodes since admission - GI following, appreciate assistance.  Tentative plan for more what sounds like inpatient EGD.  Patient may also benefit from colonoscopy if having difficulty obtaining outpatient; decision deferred to GI   Cervical spine fracture (HCC) - s/p mechanical fall also with contribution from alcohol intoxication -CT C-spine shows "possible subtle nondisplaced fracture through the superior articular facet of C7 on the right" -Neurosurgery recommending c-collar and outpatient follow-up   Alcohol dependence syndrome ([place]) - Ethanol level 360 on admission -Patient endorsed that his last drink was Tuesday evening - Continue CIWA protocol -Continue thiamine, folate, multivitamin   Pancytopenia ([place]) - Again, suspected due to bone marrow suppression from chronic alcohol use   Hypokalemia - Replete as needed   Thrombocytopenia ([place]) - Likely due to bone marrow suppression from chronic alcohol use - Continue trending   Essential hypertension - Resume amlodipine   Transaminitis - Due to chronic alcohol use - Continue trending       Principal Diagnosis: GI bleed   Discharge Diagnoses:  Active  Hospital Problems   Diagnosis Date Noted  GI bleed 01/30/2022     Priority: 1.  Cervical spine fracture ([place]) 01/31/2022     Priority: 2.  Alcohol dependence syndrome ([place]) 06/21/2019     Priority: 4.  GIB (gastrointestinal bleeding) 01/31/2022  Pancytopenia ([place]) 01/31/2022  Hypokalemia    Thrombocytopenia ([place]) 05/09/2021  Transaminitis    Essential hypertension 03/17/2014    This patient is attempting to get disability has multiple medical problems that are not being well addressed and are contributing to his disability.  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  Patient is also having difficulty with ambulation needs a walker for this.  He is still drinking about 1-2 small airplane bottles of vodka daily.  Disability paperwork was received.  04/24/22 TOC visit  Recent multiple admits for ETOH complications and withdrawal  Multple falls since DC to shelter Had RN Toc call  Hospital Course by problem list: Samuel Blevins is a 57 y.o. with a pertinent PMH of alcohol use disorder, alcoholic hepatitis, chronic pancreatitis with pancreatic insufficiency, HTN, chronic  anemia, and homelessness who presents after a fall at his shelter and admitted for deconditioning.      Fall 2/2 physical deconditioning Patient with physical deconditioning noted during previous admission as well. He refused to work with PT during last visit. His deconditioning is likely due to chronic alcohol use, chronic malnourishment, and recent hospital stay where his activity was significantly limited due to need for medications to treat alcohol withdrawal (ativan, librium, phenobarbital). Fortunately all imaging negative for acute abnormalities in the setting of this fall. Felt stronger after working with PT.  PT OT recommended home health,  thought will not be able to get at his shelter. He will return to [facility] with outpatient PT ref placed, appreciate guidance from Dr. Joya Gaskins.   Chronic pancreatitis Lipase elevated albeit improved from last admission. Encouraged PO intake given no pain, stopped fluids as he has been able to take in p.o. intake. Continued Creon.   Alcohol Use Disorder He is AAOx4. Last drink is 2/14 with ethanol level negative. He is no longer in alcohol withdrawal given out of window. He does have a history of alcohol withdrawal seizures and has required ICU level of care for management in the past.  CIWA scores have been unremarkable. No withdrawal complications during this admission.   GERD Chronic gastritis Complications of chronic alcohol use. Last EGD in 07/2021 showing gastric metaplasia consistent with peptic duodenitis. Continued PPI and carafates, can likely benefit from outpatient GI f\u.   HTN Continued amlodipine 5 mg daily.  Date of Admission: 04/11/2022  4:01 AM Date of Discharge:   Patient Left AMA on 04/15/2022. Attending Physician: Dr. Dareen Piano   Discharge Diagnosis: 1. Principal Problem:   Alcohol withdrawal ([place]) Active Problems:   Alcohol withdrawal with inpatient treatment with perceptual disturbance [facility])   Patient with decision making capacity. Left AMA 04/15/2022.   1.  Alcohol Withdrawal Seizure: Underwent CIWA protocol with ativan and librium. Also required reduced phenobarbital taper while here. Patient with decision making capacity. Left AMA 04/15/2022. Please provide appropriate substance use counseling resources when he follows up with PCP, Dr. Joya Gaskins.    2. Acute on chronic pancreatitis: seen on imaging. Home medications include creon, unclear if adherent with this. Please ensure he gets refills for creon as needed.   2.  Labs / imaging needed at time of follow-up: CBC, CMP   3.  Pending labs/ test needing follow-up: none   Follow-up Appointments:     Hospital  Course by problem list: 1. Alcohol withdrawal seizure: Patient with history of severe alcohol use disorder with multiple hospitalization for alcohol induced pancreatitis and alcohol withdrawal.  He has previously required ICU level of care for this.  Presented after a witnessed seizure at his shelter.  He reports history of seizures related to alcohol use specifically when he cuts down or stops drinking but unsure when his last seizure was.  His last drink was on the day of admission.  CIWA was consistently remained elevated during hospital course requiring Ativan as needed per CIWA protocol along with Librium taper.  After finishing Librium taper he was still having elevated CIWA scores.  He was placed on a phenobarbital taper per PCCM.  Began to have decision-making capacity on 04/14/2022 but was too weak to leave AMA.  Encouraged him to stay for further care and for physical therapy but he refused both of these.  04/15/2022, physical therapy came by to assess but he refused.  Continued to have decision-making capacity (alert and oriented to  person, place, time, reason for admission).  Despite repeat discussions for necessity to stay for further treatment, patient left AMA.  Please provide appropriate substance use counseling resources at follow-up with PCP, Dr. Joya Gaskins.   2.  Acute on chronic alcoholic pancreatitis: Initial imaging revealed acute on chronic alcoholic pancreatitis.  He had minimal pain throughout hospitalization.  Placed him on D5 LR for maintenance and he was eventually able to tolerate p.o. intake without any abdominal pain.  He is on Creon 12,000 units 3 times daily in the outpatient setting.  This was continued during his stay.  He left AMA on 04/15/2022.  Please ensure he gets refills on his Creon as needed.   At shelter yesterday Date of Encounter: 04/23/2022   Clinic visit for complaint of fall on 2/26 with small raised area right side of scalp occipital region that is tender when  touched. No bleeding or open area.  No dizziness, no nausea, no visual changes per his report but was incontinent of urine during clinic visit.. BP 129/73, pulse 96 and irregular, O2 Sat 95%.     Resident had missed follow-up appointment scheduled this morning from hospitalization discharge on 2/22, communicated with case manager at MD office. Appointment made for MD visit in AM, transportation for appointment arranged by [facility] staff.       Patient Active Problem List   Diagnosis Date Noted   Fall 04/16/2022   Alcohol withdrawal with inpatient treatment with perceptual disturbance ([place]) 04/13/2022   Cervical spine fracture ([place]) 01/31/2022   Pancytopenia ([place]) 01/31/2022   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   Hypokalemia    Portal vein thrombosis [id number]   Alcoholic pancreatitis [id number]   Alcohol-induced chronic pancreatitis ([place]) 08/02/2021   Anxiety and depression 07/04/2021   Homeless 07/04/2021   Osteoarthritis 07/04/2021   Thrombocytopenia ([place]) 05/09/2021   Pancreatic insufficiency 05/08/2021   Chronic alcohol abuse [id number]   Alcoholic steatohepatitis [id number]   Iron deficiency anemia due to chronic blood loss 12/07/2020   Seizure ([place]) 12/04/2020   Acute pancreatitis 09/16/2020   Alcohol withdrawal ([place]) 09/16/2020   Chronic pain syndrome 12/28/2019   Pancreatic pseudocyst 10/28/2019   Gastritis and gastroduodenitis    AVM (arteriovenous malformation) of small bowel, acquired    Transaminitis    Alcohol dependence syndrome ([place]) 06/21/2019   Chronic anemia 11/23/2015   Tobacco abuse 03/17/2014   Essential hypertension 03/17/2014   Past Medical History:  Diagnosis Date   Alcohol withdrawal syndrome with complication ([place])    Alcoholism ([place])    Elevated AST (SGOT)    Gastrointestinal hemorrhage    Homeless    Hypertension    Pancreatic insufficiency    takes Creon   Symptomatic anemia 11/23/2015   Thrombocytopenia  ([place]) 05/09/2021   Past Surgical History:  Procedure Laterality Date   BIOPSY  06/23/2019   Procedure: BIOPSY;  Surgeon: Lavena Bullion, DO;  Location: [facility];  Service: Gastroenterology;;   BIOPSY  12/12/2020   Procedure: BIOPSY;  Surgeon: Jackquline Denmark, MD;  Location: [facility] ENDOSCOPY;  Service: Endoscopy;;   BIOPSY  08/04/2021   Procedure: BIOPSY;  Surgeon: Daryel November, MD;  Location: [facility] ENDOSCOPY;  Service: Gastroenterology;;   ESOPHAGOGASTRODUODENOSCOPY N/A 08/04/2021   Procedure: ESOPHAGOGASTRODUODENOSCOPY (EGD);  Surgeon: Daryel November, MD;  Location: [facility] ENDOSCOPY;  Service: Gastroenterology;  Laterality: N/A;   ESOPHAGOGASTRODUODENOSCOPY (EGD) WITH PROPOFOL N/A 06/23/2019   Procedure: ESOPHAGOGASTRODUODENOSCOPY (EGD) WITH PROPOFOL;  Surgeon: Lavena Bullion, DO;  Location: [facility];  Service:  Gastroenterology;  Laterality: N/A;   ESOPHAGOGASTRODUODENOSCOPY (EGD) WITH PROPOFOL N/A 12/12/2020   Procedure: ESOPHAGOGASTRODUODENOSCOPY (EGD) WITH PROPOFOL;  Surgeon: Jackquline Denmark, MD;  Location: [facility] ENDOSCOPY;  Service: Endoscopy;  Laterality: N/A;   HOT HEMOSTASIS N/A 06/23/2019   Procedure: HOT HEMOSTASIS (ARGON PLASMA COAGULATION/BICAP);  Surgeon: Lavena Bullion, DO;  Location: [facility] ENDOSCOPY;  Service: Gastroenterology;  Laterality: N/A;   Social History   Tobacco Use   Smoking status: Every Day    Packs/day: 1.00    Years: 30.00    Total pack years: 30.00    Types: Cigarettes   Smokeless tobacco: Never  Vaping Use   Vaping Use: Never used  Substance Use Topics   Alcohol use: Yes    Alcohol/week: 28.0 standard drinks of alcohol    Types: 28 Shots of liquor per week   Drug use: Never   Social History   Socioeconomic History   Marital status: Single    Spouse name: Not on file   Number of children: Not on file   Years of education: Not on file   Highest education level: Not on file  Occupational History   Not on file  Tobacco Use   Smoking  status: Every Day    Packs/day: 1.00    Years: 30.00    Total pack years: 30.00    Types: Cigarettes   Smokeless tobacco: Never  Vaping Use   Vaping Use: Never used  Substance and Sexual Activity   Alcohol use: Yes    Alcohol/week: 28.0 standard drinks of alcohol    Types: 28 Shots of liquor per week   Drug use: Never   Sexual activity: Not Currently  Other Topics Concern   Not on file  Social History Narrative   Not on file   Social Determinants of Health   Financial Resource Strain: Not on file  Food Insecurity: Unknown (04/16/2022)   Hunger Vital Sign    Worried About Running Out of Food in the Last Year: Patient refused    [place] in the Last Year: Patient refused  Transportation Needs: Unknown (04/17/2022)   PRAPARE - Hydrologist (Medical): Patient refused    Lack of Transportation (Non-Medical): Patient refused  Physical Activity: Not on file  Stress: Not on file  Social Connections: Not on file  Intimate Partner Violence: Unknown (04/17/2022)   Humiliation, Afraid, Rape, and Kick questionnaire    Fear of Current or Ex-Partner: Patient refused    Emotionally Abused: Patient refused    Physically Abused: Patient refused    Sexually Abused: Patient refused   Family Status  Relation Name Status   Neg Hx  (Not Specified)   Family History  Problem Relation Age of Onset   Diabetes Mellitus II Neg Hx    Colon cancer Neg Hx    Stomach cancer Neg Hx    Pancreatic cancer Neg Hx    No Known Allergies    Review of Systems  Constitutional:  Positive for malaise/fatigue and weight loss. Negative for chills, diaphoresis and fever.  HENT:  Positive for nosebleeds. Negative for congestion, ear discharge, ear pain, hearing loss, sore throat and tinnitus.   Eyes:  Negative for blurred vision, double vision, photophobia and discharge.  Respiratory:  Negative for cough, hemoptysis, sputum production, shortness of breath, wheezing and  stridor.        No excess mucus  Cardiovascular:  Positive for leg swelling. Negative for chest pain, palpitations, orthopnea, claudication and PND.  Gastrointestinal:  Positive for abdominal pain, blood in stool, diarrhea, heartburn, melena, nausea and vomiting. Negative for constipation.  Genitourinary:  Negative for dysuria, flank pain, frequency, hematuria and urgency.  Musculoskeletal:  Negative for back pain, falls, joint pain, myalgias and neck pain.       Gait disturbance  Skin:  Negative for itching and rash.  Neurological:  Positive for dizziness, weakness and headaches. Negative for tingling, tremors, sensory change, speech change, focal weakness, seizures and loss of consciousness.  Endo/Heme/Allergies:  Negative for environmental allergies and polydipsia. Does not bruise/bleed easily.  Psychiatric/Behavioral:  Positive for depression and memory loss. Negative for hallucinations, substance abuse and suicidal ideas. The patient is nervous/anxious. The patient does not have insomnia.   All other systems reviewed and are negative.     Objective:     There were no vitals taken for this visit. BP Readings from Last 3 Encounters:  04/23/22 129/73  04/18/22 125/71  04/16/22 94/62   Wt Readings from Last 3 Encounters:  04/18/22 167 lb 12.3 oz (76.1 kg)  04/15/22 167 lb (75.8 kg)  04/11/22 167 lb 8.8 oz (76 kg)      Physical Exam Vitals reviewed.  Constitutional:      Appearance: Normal appearance. He is well-developed. He is ill-appearing. He is not diaphoretic.  HENT:     Head: Normocephalic and atraumatic.     Nose: Nose normal. No nasal deformity, septal deviation, mucosal edema, congestion or rhinorrhea.     Right Sinus: No maxillary sinus tenderness or frontal sinus tenderness.     Left Sinus: No maxillary sinus tenderness or frontal sinus tenderness.     Mouth/Throat:     Mouth: Mucous membranes are dry.     Pharynx: Oropharynx is clear. No oropharyngeal exudate.   Eyes:     General: No scleral icterus.    Conjunctiva/sclera: Conjunctivae normal.     Pupils: Pupils are equal, round, and reactive to light.  Neck:     Thyroid: No thyromegaly.     Vascular: No carotid bruit or JVD.     Trachea: Trachea normal. No tracheal tenderness or tracheal deviation.     Comments: Neck brace in place Cardiovascular:     Rate and Rhythm: Normal rate and regular rhythm.     Chest Wall: PMI is not displaced.     Pulses: Normal pulses. No decreased pulses.     Heart sounds: Normal heart sounds, S1 normal and S2 normal. Heart sounds not distant. No murmur heard.    No systolic murmur is present.     No diastolic murmur is present.     No friction rub. No gallop. No S3 or S4 sounds.  Pulmonary:     Effort: No tachypnea, accessory muscle usage or respiratory distress.     Breath sounds: No stridor. No decreased breath sounds, wheezing, rhonchi or rales.  Chest:     Chest wall: No tenderness.  Abdominal:     General: Bowel sounds are normal. There is distension.     Palpations: Abdomen is not rigid. There is no mass.     Tenderness: There is abdominal tenderness. There is guarding. There is no right CVA tenderness, left CVA tenderness or rebound.     Hernia: No hernia is present.  Musculoskeletal:        General: Normal range of motion.     Cervical back: Normal range of motion and neck supple. No edema, erythema, rigidity or tenderness. No muscular tenderness. Normal range of motion.  Lymphadenopathy:     Head:     Right side of head: No submental or submandibular adenopathy.     Left side of head: No submental or submandibular adenopathy.     Cervical: No cervical adenopathy.  Skin:    General: Skin is warm and dry.     Coloration: Skin is not pale.     Findings: No rash.     Nails: There is no clubbing.  Neurological:     General: No focal deficit present.     Mental Status: He is alert and oriented to person, place, and time.     Sensory: Sensory  deficit present.     Motor: Weakness present.     Coordination: Coordination abnormal.     Gait: Gait abnormal.     Deep Tendon Reflexes: Reflexes abnormal.     Comments: Resting tremor  Psychiatric:        Attention and Perception: Attention and perception normal.        Mood and Affect: Mood is depressed. Mood is not anxious. Affect is tearful.        Speech: Speech normal.        Behavior: Behavior normal. Behavior is cooperative.        Thought Content: Thought content normal. Thought content does not include homicidal or suicidal ideation. Thought content does not include homicidal or suicidal plan.        Cognition and Memory: Cognition is impaired. Memory is impaired. He exhibits impaired recent memory and impaired remote memory.        Judgment: Judgment is impulsive.      No results found for any visits on 04/24/22.   Last CBC Lab Results  Component Value Date   WBC 3.5 (L) 04/18/2022   HGB 10.6 (L) 04/18/2022   HCT 31.6 (L) 04/18/2022   MCV 95.5 04/18/2022   MCH 32.0 04/18/2022   RDW 15.4 04/18/2022   PLT 218 [id number]   Last metabolic panel Lab Results  Component Value Date   GLUCOSE 105 (H) 04/18/2022   NA 133 (L) 04/18/2022   K 4.0 04/18/2022   CL 95 (L) 04/18/2022   CO2 26 04/18/2022   BUN 7 04/18/2022   CREATININE 0.75 04/18/2022   EGFR 111 02/14/2022   CALCIUM 9.2 04/18/2022   PHOS 2.9 04/18/2022   PROT 6.9 04/18/2022   ALBUMIN 2.6 (L) 04/18/2022   LABGLOB 4.2 02/14/2022   AGRATIO 1.1 (L) 02/14/2022   BILITOT 0.6 04/18/2022   ALKPHOS 98 04/18/2022   AST 92 (H) 04/18/2022   ALT 60 (H) 04/18/2022   ANIONGAP 12 04/18/2022   Last lipids Lab Results  Component Value Date   CHOL 158 08/02/2021   HDL 71 08/02/2021   LDLCALC 73 08/02/2021   TRIG 73 08/02/2021   CHOLHDL 2.2 08/02/2021   Last hemoglobin A1c Lab Results  Component Value Date   HGBA1C 5.2 08/02/2021   Last thyroid functions Lab Results  Component Value Date   TSH 2.216  11/23/2015   Last vitamin D No results found for: "25OHVITD2", "25OHVITD3", "VD25OH" Last vitamin B12 and Folate Lab Results  Component Value Date   VITAMINB12 443 12/04/2020   FOLATE 28.8 05/07/2021      The 10-year ASCVD risk score (Arnett DK, et al., 2019) is: 17%    Assessment & Plan:   Problem List Items Addressed This Visit   None Follow-up with Herbie [place] in 1 week  45 minutes spent in care coordination  Asencion Noble, MD

## 2022-04-24 NOTE — Transitions of Care (Post Inpatient/ED Visit) (Signed)
   04/24/2022  Name: Samuel Blevins MRN: [id number] DOB: 09/27/1965  Today's TOC FU Call Status: Today's TOC FU Call Status:: Unsuccessful Call (3rd Attempt) Unsuccessful Call (1st Attempt) Date: 04/22/22 Unsuccessful Call (2nd Attempt) Date: 04/23/22 Unsuccessful Call (3rd Attempt) Date: 04/24/22  Attempted to reach the patient regarding the most recent Inpatient/ED visit.  Follow Up Plan: No further outreach attempts will be made at this time. We have been unable to contact the patient.   He has an appointment today, 04/24/2022  with Dr Joya Gaskins at [facility].  Signature Eden Lathe, RN

## 2022-04-26 ENCOUNTER — Other Ambulatory Visit (HOSPITAL_COMMUNITY): Payer: Self-pay

## 2022-05-01 ENCOUNTER — Encounter: Payer: Self-pay | Admitting: Physician Assistant

## 2022-05-01 NOTE — Progress Notes (Signed)
Pt vomits every morning.  He did not see blood today.   He has had 3 airplane bottles this am.  His stomach started hurting last pm, has not stopped. The pain got a little better after he took am meds.   His med box has some pills, has not been filled recently. He does not take bedtime meds. Carafate bottle is almost full.   The importance of taking his meds as prescribed was emphasized. He was given a Creon and a Carafate in the office.   His med box was filled.   He was told he has to leave the shelter. He got married to [employer]. He had 2 meetings for premarital counseling, got married in a church.   He can't stay at [facility] with Ms Troxler-Squyres.   She stayed with him in the hospital all 7 days.   However, after they married, she wanted him to cook dinner and clean the bathroom. He does not like her being bossy.   Rosaria Ferries, PA-C 05/01/2022 3:20 PM

## 2022-05-15 ENCOUNTER — Encounter: Payer: Self-pay | Admitting: Physician Assistant

## 2022-05-15 NOTE — Progress Notes (Signed)
error 

## 2022-05-22 ENCOUNTER — Emergency Department (HOSPITAL_COMMUNITY): Payer: Medicaid Other

## 2022-05-22 ENCOUNTER — Emergency Department (HOSPITAL_COMMUNITY)
Admission: EM | Admit: 2022-05-22 | Discharge: 2022-05-22 | Disposition: A | Discharge status: Home / Self Care | Payer: Medicaid Other | Attending: Emergency Medicine | Admitting: Emergency Medicine

## 2022-05-22 ENCOUNTER — Encounter (HOSPITAL_COMMUNITY): Payer: Self-pay

## 2022-05-22 DIAGNOSIS — I10 Essential (primary) hypertension: Secondary | ICD-10-CM | POA: Insufficient documentation

## 2022-05-22 DIAGNOSIS — W19XXXA Unspecified fall, initial encounter: Secondary | ICD-10-CM

## 2022-05-22 DIAGNOSIS — R1084 Generalized abdominal pain: Secondary | ICD-10-CM | POA: Insufficient documentation

## 2022-05-22 DIAGNOSIS — Z79899 Other long term (current) drug therapy: Secondary | ICD-10-CM | POA: Insufficient documentation

## 2022-05-22 DIAGNOSIS — M25562 Pain in left knee: Secondary | ICD-10-CM | POA: Diagnosis not present

## 2022-05-22 DIAGNOSIS — M25561 Pain in right knee: Secondary | ICD-10-CM | POA: Diagnosis not present

## 2022-05-22 DIAGNOSIS — W0110XA Fall on same level from slipping, tripping and stumbling with subsequent striking against unspecified object, initial encounter: Secondary | ICD-10-CM | POA: Insufficient documentation

## 2022-05-22 DIAGNOSIS — R109 Unspecified abdominal pain: Secondary | ICD-10-CM | POA: Diagnosis present

## 2022-05-22 LAB — CBC WITH DIFFERENTIAL/PLATELET
Abs Immature Granulocytes: 0.02 10*3/uL (ref 0.00–0.07)
Basophils Absolute: 0 10*3/uL (ref 0.0–0.1)
Basophils Relative: 1 %
Eosinophils Absolute: 0 10*3/uL (ref 0.0–0.5)
Eosinophils Relative: 1 %
HCT: 35.8 % — ABNORMAL LOW (ref 39.0–52.0)
Hemoglobin: 12.1 g/dL — ABNORMAL LOW (ref 13.0–17.0)
Immature Granulocytes: 1 %
Lymphocytes Relative: 48 %
Lymphs Abs: 2.1 10*3/uL (ref 0.7–4.0)
MCH: 31.8 pg (ref 26.0–34.0)
MCHC: 33.8 g/dL (ref 30.0–36.0)
MCV: 94.2 fL (ref 80.0–100.0)
Monocytes Absolute: 0.4 10*3/uL (ref 0.1–1.0)
Monocytes Relative: 8 %
Neutro Abs: 1.8 10*3/uL (ref 1.7–7.7)
Neutrophils Relative %: 41 %
Platelets: 316 10*3/uL (ref 150–400)
RBC: 3.8 MIL/uL — ABNORMAL LOW (ref 4.22–5.81)
RDW: 16.5 % — ABNORMAL HIGH (ref 11.5–15.5)
WBC: 4.3 10*3/uL (ref 4.0–10.5)
nRBC: 0 % (ref 0.0–0.2)

## 2022-05-22 NOTE — ED Notes (Signed)
Patient left before RN could get vitals.

## 2022-05-22 NOTE — ED Notes (Signed)
Patient was seem walking out into the ambulance bay. Staff tried to stop him and he ignored everyone. He was found smoking and security escorted him back to his bed.

## 2022-05-22 NOTE — ED Triage Notes (Addendum)
Pt bib [facility]; fell a couple of times today due to leg pain; c/o generalized abd pain, bilateral LE pain; ems reports both abd pain and LE pain chronic, worse today; pt states he received "some sort of shot" for his abd pain a couple of weeks ago without relief; not on thinners , states hit back R side of head when he fell; C/o HA; no obvious injury, pupils PERRLA, no neuro deficits; pt ambulatory with rollator in triage; 130/70, HR 70 regular, RR 18, sats 98% RA; endorses daily etoh use, denies use today, states he doesn't have money for alcohol; last drank Sunday

## 2022-05-22 NOTE — ED Provider Notes (Signed)
[facility] Provider Note   CSN: [id number] Arrival date & time: 05/22/22  1249     History  No chief complaint on file.   Samuel Blevins is a 57 y.o. male.  HPI    Patient with medical history of alcohol abuse, homelessness, hypertension, pancreatic insufficiency presents to the emergency department due to fall.  Patient states drink alcohol prior to arrival, he had a fall, he did hit his head unsure if he lost consciousness.  Denies any prodromal symptoms.  He has been having abdominal pain for the last few weeks, comes and goes.  Associate with nausea but no vomiting.  Denies any dysuria, hematuria.  Drink alcohol prior to arrival, no history of seizure withdrawal, DTs or hallucinations.  Patient informed nursing staff his last drink was on Sunday, he told me he drank prior to arrival so there are some disparity there.  Home Medications Prior to Admission medications   Medication Sig Start Date End Date Taking? Authorizing Provider  acetaminophen (TYLENOL) 325 MG tablet Take 2 tablets (650 mg total) by mouth every 6 (six) hours as needed for fever, moderate pain, mild pain or headache. 12/12/20   Katsadouros, Vasilios, MD  amLODipine (NORVASC) 5 MG tablet Take 1 tablet (5 mg total) by mouth daily. 04/18/22   Linus Galas, MD  clobetasol cream (TEMOVATE) [id number] % Apply 1 Application topically 2 (two) times daily. 03/06/22   [name], MD  folic acid (FOLVITE) 1 MG tablet Take 1 tablet (1 mg total) by mouth daily. 10/17/21   [name], MD  Iron, Ferrous Sulfate, 325 (65 Fe) MG TABS Take 1 tablet ( 325 mg) by mouth daily. 10/17/21   [name], MD  Multiple Vitamin (MULTIVITAMIN WITH MINERALS) TABS tablet Take 1 tablet by mouth daily. 09/27/21   [name], MD  ofloxacin (OCUFLOX) 0.3 % ophthalmic solution Place 1 drop into both eyes 4 (four) times daily. 03/20/22   [name], MD  ondansetron (ZOFRAN)  8 MG tablet Take 1 tablet (8 mg total) by mouth every 6 (six) hours as needed for nausea. 01/23/22   [name], MD  lipase/protease/amylase (CREON) 12000-38000 units CPEP capsule Take 1 capsule (12,000 Units total) by mouth 3 (three) times daily before meals. 04/18/22   Linus Galas, MD  pantoprazole (PROTONIX) 40 MG tablet Take 1 tablet (40 mg total) by mouth 2 (two) times daily before a meal. 04/18/22   Linus Galas, MD  polyethylene glycol (MIRALAX / GLYCOLAX) 17 g packet Take 17 g by mouth daily as needed for mild constipation or moderate constipation. 10/07/21   Johny Blamer, DO  senna-docusate (SENOKOT-S) 8.6-50 MG tablet Take 1 tablet by mouth at bedtime as needed for mild constipation. 10/07/21   Johny Blamer, DO  sucralfate (CARAFATE) 1 g tablet Take 1 tablet (1 g total) by mouth 4 (four) times daily -  with meals and at bedtime. 12/27/21   [name], MD  thiamine (VITAMIN B1) 100 MG tablet Take 1 tablet (100 mg total) by mouth daily. 10/02/21   [name], MD      Allergies    Patient has no known allergies.    Review of Systems   Review of Systems  Physical Exam Updated Vital Signs BP 125/79   Pulse 80   Temp 97.8 F (36.6 C) (Oral)   Resp 16   Ht 5\' 9"  (1.753 m)   Wt 77.1 kg  SpO2 95%   BMI 25.10 kg/m  Physical Exam Vitals and nursing note reviewed. Exam conducted with a chaperone present.  Constitutional:      Appearance: Normal appearance.     Comments: Resting comfortably, snoring.  Easily arousable with voice  HENT:     Head: Normocephalic and atraumatic.  Eyes:     General: No scleral icterus.       Right eye: No discharge.        Left eye: No discharge.     Extraocular Movements: Extraocular movements intact.     Pupils: Pupils are equal, round, and reactive to light.  Cardiovascular:     Rate and Rhythm: Normal rate and regular rhythm.     Pulses: Normal pulses.     Heart sounds: Normal heart sounds.     No  friction rub. No gallop.  Pulmonary:     Effort: Pulmonary effort is normal. No respiratory distress.     Breath sounds: Normal breath sounds.  Abdominal:     General: Abdomen is flat. Bowel sounds are normal. There is no distension.     Palpations: Abdomen is soft.     Tenderness: There is abdominal tenderness.     Comments: Generalized abdominal tenderness, seemingly worse epigastric no guarding  Musculoskeletal:        General: Tenderness present.     Cervical back: Normal range of motion. No tenderness.     Comments: Tenderness over both kneecaps, tolerates passive ROM  Skin:    General: Skin is warm and dry.     Coloration: Skin is not jaundiced.  Neurological:     Mental Status: He is alert. Mental status is at baseline.     Coordination: Coordination normal.     Comments: No asterixis     ED Results / Procedures / Treatments   Labs (all labs ordered are listed, but only abnormal results are displayed) Labs Reviewed  CBC WITH DIFFERENTIAL/PLATELET - Abnormal; Notable for the following components:      Result Value   RBC 3.80 (*)    Hemoglobin 12.1 (*)    HCT 35.8 (*)    RDW 16.5 (*)    All other components within normal limits  COMPREHENSIVE METABOLIC PANEL  LIPASE, BLOOD  I-STAT CHEM 8, ED    EKG None  Radiology DG Knee Complete 4 Views Left  Result Date: 05/22/2022 CLINICAL DATA:  [place].  Left knee pain. EXAM: LEFT KNEE - COMPLETE 4+ VIEW COMPARISON:  12/25/2021 FINDINGS: The joint spaces are maintained. No acute fracture is identified. No joint effusion. Stable vascular calcifications. IMPRESSION: No acute bony findings or joint effusion. Electronically Signed   By: Marijo Sanes M.D.   On: 05/22/2022 14:08   DG Knee Complete 4 Views Right  Result Date: 05/22/2022 CLINICAL DATA:  Pain after fall EXAM: RIGHT KNEE - COMPLETE 4 VIEW COMPARISON:  X-ray 12/25/2021 FINDINGS: No evidence of fracture, dislocation, or joint effusion. No evidence of arthropathy or other  focal bone abnormality. Soft tissues are unremarkable. Few nonspecific soft tissue calcifications are stable. Possibly vascular. IMPRESSION: No acute osseous abnormality Electronically Signed   By: Jill Side M.D.   On: 05/22/2022 13:55    Procedures Procedures    Medications Ordered in ED Medications - No data to display  ED Course/ Medical Decision Making/ A&P                             Medical  Decision Making Amount and/or Complexity of Data Reviewed Labs: ordered. Radiology: ordered.   \Patient presents to the emergency department due to fall and abdominal pain.  Will check abdominal labs, check x-rays.  Patient has a history of alcohol use disorder, I do not see any obvious signs of facial trauma but concern for possible subdural given unwitnessed fall and alcohol use, will proceed with CT head and cervical spine for further evaluation.  CBC without leukocytosis.  Plain films are negative for any fractures to the knees.  CT head and C-spine were declined by the patient, I reeval the patient who is in agreement to get this imaging now that his wife is at bedside.    I went to reevaluate the patient, he was not at bedside.  Informed patient when to [place].  Went to reevaluate the patient again, CT head and cervical spine have not been obtained yet.  Patient is no longer at bedside, he brought his belongings and apparently eloped.          Final Clinical Impression(s) / ED Diagnoses Final diagnoses:  Fall, initial encounter    Rx / DC Orders ED Discharge Orders     None         Sherrill Raring, Hershal Coria 05/22/22 1736    Godfrey Pick, MD 05/22/22 1758

## 2022-05-24 ENCOUNTER — Other Ambulatory Visit: Payer: Self-pay | Admitting: Critical Care Medicine

## 2022-05-24 ENCOUNTER — Other Ambulatory Visit: Payer: Self-pay

## 2022-05-24 MED ORDER — IRON (FERROUS SULFATE) 325 (65 FE) MG PO TABS
325.0000 mg | ORAL_TABLET | Freq: Every day | ORAL | 2 refills | Status: DC
  Filled 2022-05-24: qty 60, 60d supply, fill #0

## 2022-05-28 NOTE — Congregational Nurse Program (Unsigned)
  Dept: [phone]   Congregational Nurse Program Note  Date of Encounter: 05/23/2022  Past Medical History: Past Medical History:  Diagnosis Date   Alcohol withdrawal syndrome with complication (HCC)    Alcoholism ([place])    Elevated AST (SGOT)    Gastrointestinal hemorrhage    Homeless    Hypertension    Pancreatic insufficiency    takes Creon   Symptomatic anemia 11/23/2015   Thrombocytopenia ([place]) 05/09/2021    Encounter Details:

## 2022-05-29 ENCOUNTER — Encounter: Payer: Self-pay | Admitting: Physician Assistant

## 2022-05-29 NOTE — Progress Notes (Signed)
Pt seen by Dr Joya Gaskins.  Pt went to the ER 03/27 after he lost consciousness and fell out of his chair.   He does not remember the fall.  Admits he had been drinking more than usual that day.   He says they told him he could leave.   The notes say they wanted to do a CT scan but he was leaving and they could not prevent him from leaving.   He keeps saying that they told him he could leave. His wife was with him.   His knee is still painful. He still has head and neck pain. Also c/o nosebleed. Also c/o BRBPR yesterday, not today.   On exam, had dry nares >> given saline nasal spray.   Is upset that he cannot see out his L eye.   He has known cataracts, but has not been able to have surgery. He will need to be off ETOH in order to have surgery.   On exam, he is generally weak, but no unilateral sx on exam. No hematoma on scalp. He has some tenderness R side of neck, but no crepitus or deformity noted. ROM w/out pain.   R knee w/ lower R patellar abrasion, healing. Has pain w/ ambulation.  Pt given lidocaine patches and one was applied in clinic.   Has been drinking today, admits to 3 airplane bottles.  Has N&V every morning.  Abd is tender today. Not currently nauseated. Am Zofran added to meds.   Med box is pretty full. Has taken occasional am meds.   His phone came back on today, but won't have service for a couple more days.   BP 120/78   Pulse (!) 106   SpO2 92%  He requests a refill of Depends, size large. These were left behind the desk.   Rosaria Ferries, PA-C 05/29/2022 3:04 PM

## 2022-05-30 NOTE — Congregational Nurse Program (Signed)
  Dept: [phone]   Congregational Nurse Program Note  Date of Encounter: 05/30/2022  Clinic visit for complaint of decreased vision left eye and of tearing. No redness or drainage noted from eye other than tearing and denies any pain.  Referral to [facility] after consulting PCP. Past Medical History: Past Medical History:  Diagnosis Date   Alcohol withdrawal syndrome with complication (HCC)    Alcoholism ([place])    Elevated AST (SGOT)    Gastrointestinal hemorrhage    Homeless    Hypertension    Pancreatic insufficiency    takes Creon   Symptomatic anemia 11/23/2015   Thrombocytopenia ([place]) 05/09/2021    Encounter Details:  CNP Questionnaire - 05/30/22 1000       Questionnaire   Ask client: Do you give verbal consent for me to treat you today? Yes    Student Assistance N/A    Location Patient [facility]    Visit Setting with Client Organization    Patient Status Research officer, political party or [place]    Insurance/Financial Assistance Referral N/A    Medication Have Medication Insecurities    Medical Provider Yes    Screening Referrals Made N/A    Medical Referrals [facility] App[facility] Made [facility]    Recently w/o PCP, now 1st time PCP visit completed due to CNs referral or appointment made N/A    Food Have Food Insecurities    Transportation Need transportation assistance;Provided transportation assistance    Housing/Utilities No permanent housing    Interpersonal Safety Do not feel safe at current residence    Interventions Advocate/Support;Educate;Counsel;Navigate Healthcare System;Case Management    Abnormal to Normal Screening Since Last CN Visit N/A    Screenings CN Performed N/A    Sent Client to Lab for: N/A    Did client attend any of the following based off CNs referral or appointments made? N/A    ED Visit Averted Yes    Life-Saving Intervention Made N/A

## 2022-06-05 ENCOUNTER — Other Ambulatory Visit: Payer: Self-pay

## 2022-06-05 ENCOUNTER — Other Ambulatory Visit: Payer: Self-pay | Admitting: Critical Care Medicine

## 2022-06-05 ENCOUNTER — Encounter: Payer: Self-pay | Admitting: Physician Assistant

## 2022-06-05 MED ORDER — ERYTHROMYCIN 5 MG/GM OP OINT
1.0000 | TOPICAL_OINTMENT | Freq: Every day | OPHTHALMIC | 0 refills | Status: DC
  Filled 2022-06-05: qty 3.5, 14d supply, fill #0

## 2022-06-05 NOTE — Progress Notes (Signed)
Pt seen by Dr [name].  Pt not fond of the ER, says he needs to go back because of his stomach and his knees and his back.   Has been drinking vodka and OJ. A fifth lasts him about 3 days.  He fell and hurt his R knee. He fell last pm in the bathroom. His R knee has a healing abrasion, > 24 hr ago. Lidocaine patch applied  Has trouble seeing. Has conjunctivitis L eye in addition to cataracts, will be given eye drops for this.   He has back pain as well.  He has chronic belly pain. Abd is tender.   Meds reviewed, he has not taken much since he took meds w/ [place] last Weds.   Lawanna Kobus tries to make sure he takes am meds.   Leta Jungling will help make sure he takes meds twice a day.   Theodore Demark, PA-C 06/05/2022 3:16 PM

## 2022-06-06 ENCOUNTER — Other Ambulatory Visit: Payer: Self-pay

## 2022-06-06 ENCOUNTER — Emergency Department (HOSPITAL_COMMUNITY)
Admission: EM | Admit: 2022-06-06 | Discharge: 2022-06-08 | Disposition: A | Discharge status: Home / Self Care | Payer: Medicaid Other | Attending: Emergency Medicine | Admitting: Emergency Medicine

## 2022-06-06 ENCOUNTER — Emergency Department (HOSPITAL_COMMUNITY): Payer: Medicaid Other

## 2022-06-06 DIAGNOSIS — F10929 Alcohol use, unspecified with intoxication, unspecified: Secondary | ICD-10-CM | POA: Diagnosis not present

## 2022-06-06 DIAGNOSIS — W0110XA Fall on same level from slipping, tripping and stumbling with subsequent striking against unspecified object, initial encounter: Secondary | ICD-10-CM | POA: Diagnosis not present

## 2022-06-06 DIAGNOSIS — W19XXXA Unspecified fall, initial encounter: Secondary | ICD-10-CM

## 2022-06-06 DIAGNOSIS — F17210 Nicotine dependence, cigarettes, uncomplicated: Secondary | ICD-10-CM | POA: Diagnosis not present

## 2022-06-06 DIAGNOSIS — F10920 Alcohol use, unspecified with intoxication, uncomplicated: Secondary | ICD-10-CM

## 2022-06-06 DIAGNOSIS — Y908 Blood alcohol level of 240 mg/100 ml or more: Secondary | ICD-10-CM | POA: Insufficient documentation

## 2022-06-06 DIAGNOSIS — E876 Hypokalemia: Secondary | ICD-10-CM | POA: Diagnosis not present

## 2022-06-06 DIAGNOSIS — F1090 Alcohol use, unspecified, uncomplicated: Secondary | ICD-10-CM | POA: Diagnosis present

## 2022-06-06 LAB — BASIC METABOLIC PANEL
Anion gap: 15 (ref 5–15)
BUN: 5 mg/dL — ABNORMAL LOW (ref 6–20)
CO2: 24 mmol/L (ref 22–32)
Calcium: 8.6 mg/dL — ABNORMAL LOW (ref 8.9–10.3)
Chloride: 99 mmol/L (ref 98–111)
Creatinine, Ser: 0.52 mg/dL — ABNORMAL LOW (ref 0.61–1.24)
GFR, Estimated: >60 mL/min (ref >60)
Glucose, Bld: 123 mg/dL — ABNORMAL HIGH (ref 70–99)
Potassium: 3 mmol/L — ABNORMAL LOW (ref 3.5–5.1)
Sodium: 138 mmol/L (ref 135–145)

## 2022-06-06 LAB — CBC
HCT: 31.5 % — ABNORMAL LOW (ref 39.0–52.0)
Hemoglobin: 10.8 g/dL — ABNORMAL LOW (ref 13.0–17.0)
MCH: 31.6 pg (ref 26.0–34.0)
MCHC: 34.3 g/dL (ref 30.0–36.0)
MCV: 92.1 fL (ref 80.0–100.0)
Platelets: 109 10*3/uL — ABNORMAL LOW (ref 150–400)
RBC: 3.42 MIL/uL — ABNORMAL LOW (ref 4.22–5.81)
RDW: 16.2 % — ABNORMAL HIGH (ref 11.5–15.5)
WBC: 4.1 10*3/uL (ref 4.0–10.5)
nRBC: 0 % (ref 0.0–0.2)

## 2022-06-06 LAB — ETHANOL: Alcohol, Ethyl (B): 358 mg/dL (ref <10)

## 2022-06-06 MED ORDER — POTASSIUM CHLORIDE CRYS ER 20 MEQ PO TBCR
40.0000 meq | EXTENDED_RELEASE_TABLET | Freq: Once | ORAL | Status: AC
  Administered 2022-06-06: 40 meq via ORAL
  Filled 2022-06-06: qty 2

## 2022-06-06 NOTE — ED Provider Triage Note (Signed)
Emergency Medicine Provider Triage Evaluation Note  Samuel Blevins , a 57 y.o. male  was evaluated in triage.  Pt complains of states he has problems with his equilibrium and lost his balance today falling forward.  He landed with his arms stretched out but also thinks he may have hit his head.  Review of Systems  Positive: Headache, neck pain, left shoulder and chest pain Negative: Vision changes, numbness or weakness, shortness of breath  Physical Exam  BP 136/79 (BP Location: Left Arm)   Pulse 94   Temp 97.7 F (36.5 C) (Oral)   Resp 18   Ht 6\' 1"  (1.854 m)   Wt 77.1 kg   SpO2 95%   BMI 22.43 kg/m  Gen:   Awake, no distress   Resp:  Normal effort  MSK:   Moves extremities without difficulty but pain with palpation of the left shoulder.  Normal pulse.  Midline cervical tenderness Other:    Medical Decision Making  Medically screening exam initiated at 1:49 PM.  Appropriate orders placed.  Samuel Blevins was informed that the remainder of the evaluation will be completed by another provider, this initial triage assessment does not replace that evaluation, and the importance of remaining in the ED until their evaluation is complete.  Imaging pending.  Pt can go to waiting room   Samuel Sprout, MD 06/06/22 1351

## 2022-06-06 NOTE — ED Notes (Signed)
ED PA at [facility] 

## 2022-06-06 NOTE — ED Provider Notes (Signed)
[place] EMERGENCY DEPARTMENT AT [facility] Provider Note   CSN: [id number] Arrival date & time: 06/06/22  1308     History  Chief Complaint  Patient presents with   Samuel Blevins is a 57 y.o. male.  57 year old male here mechanical fall prior to arrival.  Patient admits to alcohol use this evening.  Patient states he was smoking cigarettes and became dizzy.  Denies any chest pain or shortness of breath.  Struck his head but did not lose consciousness.  Patient normally has trouble with his balance.  EMS was called and patient initially placed in c-collar which he took off.  CBG was 121.       Home Medications Prior to Admission medications   Medication Sig Start Date End Date Taking? Authorizing Provider  acetaminophen (TYLENOL) 325 MG tablet Take 2 tablets (650 mg total) by mouth every 6 (six) hours as needed for fever, moderate pain, mild pain or headache. 12/12/20   Katsadouros, Vasilios, MD  amLODipine (NORVASC) 5 MG tablet Take 1 tablet (5 mg total) by mouth daily. 04/18/22   Lyndle Herrlich, MD  clobetasol cream (TEMOVATE) 0.05 % Apply 1 Application topically 2 (two) times daily. 03/06/22   [name], MD  erythromycin ophthalmic ointment Place 1 Application into both eyes at bedtime. 06/05/22   [name], MD  folic acid (FOLVITE) 1 MG tablet Take 1 tablet (1 mg total) by mouth daily. 10/17/21   [name], MD  Iron, Ferrous Sulfate, 325 (65 Fe) MG TABS Take 1 tablet ( 325 mg) by mouth daily. 05/24/22   [name], MD  Multiple Vitamin (MULTIVITAMIN WITH MINERALS) TABS tablet Take 1 tablet by mouth daily. 09/27/21   [name], MD  ofloxacin (OCUFLOX) 0.3 % ophthalmic solution Place 1 drop into both eyes 4 (four) times daily. 03/20/22   [name], MD  ondansetron (ZOFRAN) 8 MG tablet Take 1 tablet (8 mg total) by mouth every 6 (six) hours as needed for nausea. 01/23/22   [name], MD   lipase/protease/amylase (CREON) 12000-38000 units CPEP capsule Take 1 capsule (12,000 Units total) by mouth 3 (three) times daily before meals. 04/18/22   Lyndle Herrlich, MD  pantoprazole (PROTONIX) 40 MG tablet Take 1 tablet (40 mg total) by mouth 2 (two) times daily before a meal. 04/18/22   Lyndle Herrlich, MD  polyethylene glycol (MIRALAX / GLYCOLAX) 17 g packet Take 17 g by mouth daily as needed for mild constipation or moderate constipation. 10/07/21   Rocky Morel, DO  senna-docusate (SENOKOT-S) 8.6-50 MG tablet Take 1 tablet by mouth at bedtime as needed for mild constipation. 10/07/21   Rocky Morel, DO  sucralfate (CARAFATE) 1 g tablet Take 1 tablet (1 g total) by mouth 4 (four) times daily -  with meals and at bedtime. 12/27/21   [name], MD  thiamine (VITAMIN B1) 100 MG tablet Take 1 tablet (100 mg total) by mouth daily. 10/02/21   [name], MD      Allergies    Patient has no known allergies.    Review of Systems   Review of Systems  All other systems reviewed and are negative.   Physical Exam Updated Vital Signs BP 136/78   Pulse 92   Temp 98.3 F (36.8 C)   Resp 16   Ht 1.854 m (6\' 1" )   Wt 77.1 kg   SpO2 93%   BMI 22.43 kg/m  Physical  Exam Vitals and nursing note reviewed.  Constitutional:      General: He is not in acute distress.    Appearance: Normal appearance. He is well-developed. He is not toxic-appearing.  HENT:     Head: Normocephalic and atraumatic.  Eyes:     General: Lids are normal.     Conjunctiva/sclera: Conjunctivae normal.     Pupils: Pupils are equal, round, and reactive to light.  Neck:     Thyroid: No thyroid mass.     Trachea: No tracheal deviation.  Cardiovascular:     Rate and Rhythm: Normal rate and regular rhythm.     Heart sounds: Normal heart sounds. No murmur heard.    No gallop.  Pulmonary:     Effort: Pulmonary effort is normal. No respiratory distress.     Breath sounds: Normal breath  sounds. No stridor. No decreased breath sounds, wheezing, rhonchi or rales.  Abdominal:     General: There is no distension.     Palpations: Abdomen is soft.     Tenderness: There is no abdominal tenderness. There is no rebound.  Musculoskeletal:        General: No tenderness. Normal range of motion.     Cervical back: Normal range of motion and neck supple.  Skin:    General: Skin is warm and dry.     Findings: No abrasion or rash.  Neurological:     General: No focal deficit present.     Mental Status: He is alert and oriented to person, place, and time. Mental status is at baseline.     GCS: GCS eye subscore is 4. GCS verbal subscore is 5. GCS motor subscore is 6.     Cranial Nerves: No cranial nerve deficit.     Sensory: No sensory deficit.     Motor: Motor function is intact.  Psychiatric:        Attention and Perception: Attention normal.        Speech: Speech normal.        Behavior: Behavior normal.     ED Results / Procedures / Treatments   Labs (all labs ordered are listed, but only abnormal results are displayed) Labs Reviewed  CBC - Abnormal; Notable for the following components:      Result Value   RBC 3.42 (*)    Hemoglobin 10.8 (*)    HCT 31.5 (*)    RDW 16.2 (*)    Platelets 109 (*)    All other components within normal limits  BASIC METABOLIC PANEL - Abnormal; Notable for the following components:   Potassium 3.0 (*)    Glucose, Bld 123 (*)    BUN 5 (*)    Creatinine, Ser 0.52 (*)    Calcium 8.6 (*)    All other components within normal limits  ETHANOL - Abnormal; Notable for the following components:   Alcohol, Ethyl (B) 358 (*)    All other components within normal limits    EKG None  Radiology CT Head Wo Contrast  Result Date: 06/06/2022 CLINICAL DATA:  Head trauma, moderate-severe; Neck trauma, midline tenderness (Age 27-64y) EXAM: CT HEAD WITHOUT CONTRAST CT CERVICAL SPINE WITHOUT CONTRAST TECHNIQUE: Multidetector CT imaging of the head and  cervical spine was performed following the standard protocol without intravenous contrast. Multiplanar CT image reconstructions of the cervical spine were also generated. RADIATION DOSE REDUCTION: This exam was performed according to the departmental dose-optimization program which includes automated exposure control, adjustment of the mA and/or kV according  to patient size and/or use of iterative reconstruction technique. COMPARISON:  None Available. FINDINGS: CT HEAD FINDINGS Brain: No evidence of acute infarction, hemorrhage, hydrocephalus, extra-axial collection or mass lesion/mass effect. Vascular: No hyperdense vessel. Skull: No acute fracture. Sinuses/Orbits: Remote appearing left medial orbital wall fracture. No acute orbital finding. Other: No mastoid effusions. CT CERVICAL SPINE FINDINGS Alignment: Mild reversal of the normal cervical lordosis. Mild likely degenerative retrolisthesis of C3 on C4 and C4 on C5. Skull base and vertebrae: No evidence of acute fracture. Vertebral body heights are maintained. Soft tissues and spinal canal: No prevertebral fluid or swelling. No visible canal hematoma. Disc levels: Multilevel facet and uncovertebral hypertrophy with varying degrees of neural foraminal stenosis. Upper chest: Visualized lung apices are clear. IMPRESSION: No evidence of acute abnormality intracranially or in the cervical spine. Electronically Signed   By: Feliberto Harts M.D.   On: 06/06/2022 14:47   CT Cervical Spine Wo Contrast  Result Date: 06/06/2022 CLINICAL DATA:  Head trauma, moderate-severe; Neck trauma, midline tenderness (Age 58-64y) EXAM: CT HEAD WITHOUT CONTRAST CT CERVICAL SPINE WITHOUT CONTRAST TECHNIQUE: Multidetector CT imaging of the head and cervical spine was performed following the standard protocol without intravenous contrast. Multiplanar CT image reconstructions of the cervical spine were also generated. RADIATION DOSE REDUCTION: This exam was performed according to the  departmental dose-optimization program which includes automated exposure control, adjustment of the mA and/or kV according to patient size and/or use of iterative reconstruction technique. COMPARISON:  None Available. FINDINGS: CT HEAD FINDINGS Brain: No evidence of acute infarction, hemorrhage, hydrocephalus, extra-axial collection or mass lesion/mass effect. Vascular: No hyperdense vessel. Skull: No acute fracture. Sinuses/Orbits: Remote appearing left medial orbital wall fracture. No acute orbital finding. Other: No mastoid effusions. CT CERVICAL SPINE FINDINGS Alignment: Mild reversal of the normal cervical lordosis. Mild likely degenerative retrolisthesis of C3 on C4 and C4 on C5. Skull base and vertebrae: No evidence of acute fracture. Vertebral body heights are maintained. Soft tissues and spinal canal: No prevertebral fluid or swelling. No visible canal hematoma. Disc levels: Multilevel facet and uncovertebral hypertrophy with varying degrees of neural foraminal stenosis. Upper chest: Visualized lung apices are clear. IMPRESSION: No evidence of acute abnormality intracranially or in the cervical spine. Electronically Signed   By: Feliberto Harts M.D.   On: 06/06/2022 14:47   DG Chest 2 View  Result Date: 06/06/2022 CLINICAL DATA:  pain after fall EXAM: CHEST - 2 VIEW COMPARISON:  Radiograph 04/16/2022 FINDINGS: Unchanged cardiomediastinal silhouette. There is no focal airspace consolidation. There is no pleural effusion or evidence of pneumothorax. No acute osseous abnormality. IMPRESSION: No evidence of acute cardiopulmonary disease. Electronically Signed   By: Caprice Renshaw M.D.   On: 06/06/2022 14:39   DG Shoulder Left  Result Date: 06/06/2022 CLINICAL DATA:  pain after fall EXAM: LEFT SHOULDER - 2+ VIEW COMPARISON:  None Available. FINDINGS: There is no evidence of acute fracture. Alignment is normal. 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 IMPRESSION: No acute fracture or  dislocation. 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 Electronically Signed   By: Caprice Renshaw M.D.   On: 06/06/2022 14:37    Procedures Procedures    Medications Ordered in ED Medications  potassium chloride SA (KLOR-CON M) CR tablet 40 mEq (has no administration in time range)    ED Course/ Medical Decision Making/ A&P  Medical Decision Making Risk Prescription drug management.  Patient mildly hypokalemic potassium of 3.0.  Given 40 mill equivalents of potassium. Patient here after fall.  Head and neck CT per my review interpretation showed no significant findings.  Plain x-rays of his chest as well as his left shoulder are negative for fracture.  Patient's alcohol level is elevated at 358.  Will need to sober up and then will be discharged.  Signed over to Dr. Manus Gunningancour        Final Clinical Impression(s) / ED Diagnoses Final diagnoses:  None    Rx / DC Orders ED Discharge Orders     None         Lorre NickAllen, Jehu Mccauslin, MD 06/06/22 2246

## 2022-06-06 NOTE — Discharge Instructions (Addendum)
Continue your Protonix.  Reduce your alcohol intake.  Follow-up with your primary doctor.  Return to the ED with new or worsening symptoms.

## 2022-06-06 NOTE — ED Notes (Signed)
EDP at BS 

## 2022-06-06 NOTE — ED Triage Notes (Signed)
BIB [facility] from [facility] s/p fall at 0400 while standing outside smoking when he felt dizzy. Denies LOC, hitting head, or blood thinners. H/o multiple recent falls. Seen here for the same. Recent neck fx. "Is suppose to wear c-collar all the time", but "threw it away". EMS applied hard cervical collar. C/o neck, back and leg pain. Able to transfer from stretcher to w/c with assistance. Wife present. Rolling walker brought with. VSS. BP high 182/64. CBG 121. Has not taken his usual daily meds today. Alert, NAD, calm, interactive.

## 2022-06-06 NOTE — ED Notes (Signed)
EDP Laveda Norman and CN notified of ETOH level

## 2022-06-07 ENCOUNTER — Emergency Department (HOSPITAL_COMMUNITY): Payer: Medicaid Other

## 2022-06-07 LAB — HEPATIC FUNCTION PANEL
ALT: 33 U/L (ref 0–44)
AST: 109 U/L — ABNORMAL HIGH (ref 15–41)
Albumin: 3.4 g/dL — ABNORMAL LOW (ref 3.5–5.0)
Alkaline Phosphatase: 111 U/L (ref 38–126)
Bilirubin, Direct: 0.2 mg/dL (ref 0.0–0.2)
Indirect Bilirubin: 0.7 mg/dL (ref 0.3–0.9)
Total Bilirubin: 0.9 mg/dL (ref 0.3–1.2)
Total Protein: 8.5 g/dL — ABNORMAL HIGH (ref 6.5–8.1)

## 2022-06-07 LAB — LIPASE, BLOOD: Lipase: 62 U/L — ABNORMAL HIGH (ref 11–51)

## 2022-06-07 MED ORDER — IOHEXOL 350 MG/ML SOLN
75.0000 mL | Freq: Once | INTRAVENOUS | Status: AC | PRN
  Administered 2022-06-07: 75 mL via INTRAVENOUS

## 2022-06-07 NOTE — ED Notes (Addendum)
Pt pass fluid challenge, able to ambulate with walker safely

## 2022-06-07 NOTE — ED Provider Notes (Signed)
Care assumed from Dr. Freida Busman.  Patient intoxicated and fell striking his head.  He is awaiting sobriety.  CT head and C-spine are reassuring.    At 2 AM, patient is awake, alert, tolerating p.o. and ambulatory with his walker.  He is complaining of severe abdominal pain which she has had in the past and states "I get better with shots in the stomach".  Add hepatic function and lipase to assess for pancreatitis CT abdomen pelvis does not show any acute findings.  Does show similar inflammation behind the pancreas.  LFTs and lipase are stable.  Lipase is 62.  Patient is tolerating p.o. and ambulatory.  He is requesting discharge. Reduce alcohol intake.  Start PPI.  Return precautions discussed.   Glynn Octave, MD 06/07/22 [phone]

## 2022-06-07 NOTE — ED Notes (Signed)
Lipase & hep panel redrawn & sent to lab

## 2022-06-09 ENCOUNTER — Inpatient Hospital Stay (HOSPITAL_COMMUNITY)
Admission: EM | Admit: 2022-06-09 | Discharge: 2022-06-12 | DRG: 193 | Disposition: A | Discharge status: Home / Self Care | Payer: Medicaid Other | Attending: Pulmonary Disease | Admitting: Pulmonary Disease

## 2022-06-09 ENCOUNTER — Emergency Department (HOSPITAL_COMMUNITY): Payer: Medicaid Other

## 2022-06-09 ENCOUNTER — Other Ambulatory Visit: Payer: Self-pay

## 2022-06-09 ENCOUNTER — Encounter (HOSPITAL_COMMUNITY): Payer: Self-pay | Admitting: *Deleted

## 2022-06-09 DIAGNOSIS — K8689 Other specified diseases of pancreas: Secondary | ICD-10-CM | POA: Diagnosis present

## 2022-06-09 DIAGNOSIS — K861 Other chronic pancreatitis: Secondary | ICD-10-CM | POA: Diagnosis present

## 2022-06-09 DIAGNOSIS — Y908 Blood alcohol level of 240 mg/100 ml or more: Secondary | ICD-10-CM | POA: Diagnosis present

## 2022-06-09 DIAGNOSIS — N179 Acute kidney failure, unspecified: Secondary | ICD-10-CM | POA: Diagnosis present

## 2022-06-09 DIAGNOSIS — J189 Pneumonia, unspecified organism: Principal | ICD-10-CM | POA: Diagnosis present

## 2022-06-09 DIAGNOSIS — J9601 Acute respiratory failure with hypoxia: Secondary | ICD-10-CM | POA: Diagnosis present

## 2022-06-09 DIAGNOSIS — E871 Hypo-osmolality and hyponatremia: Secondary | ICD-10-CM | POA: Diagnosis present

## 2022-06-09 DIAGNOSIS — Z1152 Encounter for screening for COVID-19: Secondary | ICD-10-CM

## 2022-06-09 DIAGNOSIS — Z79899 Other long term (current) drug therapy: Secondary | ICD-10-CM

## 2022-06-09 DIAGNOSIS — Z5900 Homelessness unspecified: Secondary | ICD-10-CM

## 2022-06-09 DIAGNOSIS — K2920 Alcoholic gastritis without bleeding: Secondary | ICD-10-CM | POA: Diagnosis present

## 2022-06-09 DIAGNOSIS — I10 Essential (primary) hypertension: Secondary | ICD-10-CM | POA: Diagnosis present

## 2022-06-09 DIAGNOSIS — E876 Hypokalemia: Secondary | ICD-10-CM | POA: Diagnosis present

## 2022-06-09 DIAGNOSIS — R6521 Severe sepsis with septic shock: Secondary | ICD-10-CM | POA: Diagnosis present

## 2022-06-09 DIAGNOSIS — F10131 Alcohol abuse with withdrawal delirium: Secondary | ICD-10-CM | POA: Diagnosis present

## 2022-06-09 DIAGNOSIS — A419 Sepsis, unspecified organism: Secondary | ICD-10-CM

## 2022-06-09 DIAGNOSIS — N170 Acute kidney failure with tubular necrosis: Secondary | ICD-10-CM | POA: Diagnosis present

## 2022-06-09 DIAGNOSIS — F17210 Nicotine dependence, cigarettes, uncomplicated: Secondary | ICD-10-CM | POA: Diagnosis present

## 2022-06-09 DIAGNOSIS — E860 Dehydration: Secondary | ICD-10-CM | POA: Diagnosis present

## 2022-06-09 DIAGNOSIS — K859 Acute pancreatitis without necrosis or infection, unspecified: Secondary | ICD-10-CM | POA: Diagnosis present

## 2022-06-09 DIAGNOSIS — G9340 Encephalopathy, unspecified: Secondary | ICD-10-CM

## 2022-06-09 DIAGNOSIS — D6959 Other secondary thrombocytopenia: Secondary | ICD-10-CM | POA: Diagnosis present

## 2022-06-09 DIAGNOSIS — G894 Chronic pain syndrome: Secondary | ICD-10-CM | POA: Diagnosis present

## 2022-06-09 DIAGNOSIS — R7401 Elevation of levels of liver transaminase levels: Secondary | ICD-10-CM

## 2022-06-09 DIAGNOSIS — D649 Anemia, unspecified: Secondary | ICD-10-CM

## 2022-06-09 DIAGNOSIS — F10939 Alcohol use, unspecified with withdrawal, unspecified: Secondary | ICD-10-CM | POA: Diagnosis present

## 2022-06-09 DIAGNOSIS — F1010 Alcohol abuse, uncomplicated: Secondary | ICD-10-CM | POA: Diagnosis present

## 2022-06-09 DIAGNOSIS — D696 Thrombocytopenia, unspecified: Secondary | ICD-10-CM

## 2022-06-09 LAB — HEPATIC FUNCTION PANEL
ALT: 32 U/L (ref 0–44)
AST: 102 U/L — ABNORMAL HIGH (ref 15–41)
Albumin: 3.6 g/dL (ref 3.5–5.0)
Alkaline Phosphatase: 117 U/L (ref 38–126)
Bilirubin, Direct: 0.3 mg/dL — ABNORMAL HIGH (ref 0.0–0.2)
Indirect Bilirubin: 0.7 mg/dL (ref 0.3–0.9)
Total Bilirubin: 1 mg/dL (ref 0.3–1.2)
Total Protein: 8.8 g/dL — ABNORMAL HIGH (ref 6.5–8.1)

## 2022-06-09 LAB — BASIC METABOLIC PANEL
Anion gap: 15 (ref 5–15)
BUN: 13 mg/dL (ref 6–20)
CO2: 22 mmol/L (ref 22–32)
Calcium: 8.7 mg/dL — ABNORMAL LOW (ref 8.9–10.3)
Chloride: 99 mmol/L (ref 98–111)
Creatinine, Ser: 1.4 mg/dL — ABNORMAL HIGH (ref 0.61–1.24)
GFR, Estimated: 58 mL/min — ABNORMAL LOW (ref >60)
Glucose, Bld: 93 mg/dL (ref 70–99)
Potassium: 3.2 mmol/L — ABNORMAL LOW (ref 3.5–5.1)
Sodium: 136 mmol/L (ref 135–145)

## 2022-06-09 LAB — TROPONIN I (HIGH SENSITIVITY): Troponin I (High Sensitivity): 9 ng/L (ref <18)

## 2022-06-09 LAB — CBC
HCT: 34.7 % — ABNORMAL LOW (ref 39.0–52.0)
Hemoglobin: 11.7 g/dL — ABNORMAL LOW (ref 13.0–17.0)
MCH: 31.5 pg (ref 26.0–34.0)
MCHC: 33.7 g/dL (ref 30.0–36.0)
MCV: 93.3 fL (ref 80.0–100.0)
Platelets: 123 10*3/uL — ABNORMAL LOW (ref 150–400)
RBC: 3.72 MIL/uL — ABNORMAL LOW (ref 4.22–5.81)
RDW: 16.3 % — ABNORMAL HIGH (ref 11.5–15.5)
WBC: 10.9 10*3/uL — ABNORMAL HIGH (ref 4.0–10.5)
nRBC: 0 % (ref 0.0–0.2)

## 2022-06-09 LAB — BRAIN NATRIURETIC PEPTIDE: B Natriuretic Peptide: 17.3 pg/mL (ref 0.0–100.0)

## 2022-06-09 MED ORDER — SODIUM CHLORIDE 0.9 % IV BOLUS
500.0000 mL | Freq: Once | INTRAVENOUS | Status: AC
  Administered 2022-06-09: 500 mL via INTRAVENOUS

## 2022-06-09 MED ORDER — MORPHINE SULFATE (PF) 4 MG/ML IV SOLN
4.0000 mg | Freq: Once | INTRAVENOUS | Status: AC
  Administered 2022-06-09: 4 mg via INTRAVENOUS
  Filled 2022-06-09: qty 1

## 2022-06-09 NOTE — ED Provider Notes (Signed)
[place] EMERGENCY DEPARTMENT AT [facility] Provider Note   CSN: [id number] Arrival date & time: 06/09/22  1957     History  Chief Complaint  Patient presents with   Chest Pain   Shortness of Breath   Abdominal Pain    Samuel Blevins is a 57 y.o. male.  HPI   57 year old male with past medical history of alcohol abuse, HTN, pancreatic insufficiency presents emergency department with complaint of chest/back and abdominal pain.  Patient states that this started early around 3 PM, has been constant.  Associated with nausea but no vomiting or diarrhea.  Patient endorses intermittent shortness of breath without cough.  No swelling of his lower extremities.  Patient was given nitro with EMS without any relief.  Patient is a minimal and poor historian.  Home Medications Prior to Admission medications   Medication Sig Start Date End Date Taking? Authorizing Provider  acetaminophen (TYLENOL) 325 MG tablet Take 2 tablets (650 mg total) by mouth every 6 (six) hours as needed for fever, moderate pain, mild pain or headache. 12/12/20   Katsadouros, Vasilios, MD  amLODipine (NORVASC) 5 MG tablet Take 1 tablet (5 mg total) by mouth daily. 04/18/22   Lyndle Herrlich, MD  clobetasol cream (TEMOVATE) 0.05 % Apply 1 Application topically 2 (two) times daily. 03/06/22   [name], MD  erythromycin ophthalmic ointment Place 1 Application into both eyes at bedtime. 06/05/22   [name], MD  folic acid (FOLVITE) 1 MG tablet Take 1 tablet (1 mg total) by mouth daily. 10/17/21   [name], MD  Iron, Ferrous Sulfate, 325 (65 Fe) MG TABS Take 1 tablet ( 325 mg) by mouth daily. 05/24/22   [name], MD  Multiple Vitamin (MULTIVITAMIN WITH MINERALS) TABS tablet Take 1 tablet by mouth daily. 09/27/21   [name], MD  ofloxacin (OCUFLOX) 0.3 % ophthalmic solution Place 1 drop into both eyes 4 (four) times daily. 03/20/22   [name], MD  ondansetron  (ZOFRAN) 8 MG tablet Take 1 tablet (8 mg total) by mouth every 6 (six) hours as needed for nausea. 01/23/22   [name], MD  lipase/protease/amylase (CREON) 12000-38000 units CPEP capsule Take 1 capsule (12,000 Units total) by mouth 3 (three) times daily before meals. 04/18/22   Lyndle Herrlich, MD  pantoprazole (PROTONIX) 40 MG tablet Take 1 tablet (40 mg total) by mouth 2 (two) times daily before a meal. 04/18/22   Lyndle Herrlich, MD  polyethylene glycol (MIRALAX / GLYCOLAX) 17 g packet Take 17 g by mouth daily as needed for mild constipation or moderate constipation. 10/07/21   Rocky Morel, DO  senna-docusate (SENOKOT-S) 8.6-50 MG tablet Take 1 tablet by mouth at bedtime as needed for mild constipation. 10/07/21   Rocky Morel, DO  sucralfate (CARAFATE) 1 g tablet Take 1 tablet (1 g total) by mouth 4 (four) times daily -  with meals and at bedtime. 12/27/21   [name], MD  thiamine (VITAMIN B1) 100 MG tablet Take 1 tablet (100 mg total) by mouth daily. 10/02/21   [name], MD      Allergies    Patient has no known allergies.    Review of Systems   Review of Systems  Constitutional:  Positive for fatigue. Negative for fever.  Respiratory:  Positive for shortness of breath.   Cardiovascular:  Positive for chest pain. Negative for leg swelling.  Gastrointestinal:  Positive for abdominal pain. Negative for diarrhea  and vomiting.  Musculoskeletal:  Positive for back pain.  Skin:  Negative for rash.  Neurological:  Negative for headaches.    Physical Exam Updated Vital Signs BP 119/65   Pulse (!) 109   Temp 99.7 F (37.6 C) (Rectal)   Resp (!) 22   Ht 6\' 1"  (1.854 m)   Wt 77.1 kg   SpO2 93%   BMI 22.43 kg/m  Physical Exam Vitals and nursing note reviewed.  Constitutional:      General: He is not in acute distress.    Appearance: Normal appearance. He is not ill-appearing.  HENT:     Head: Normocephalic.     Mouth/Throat:     Mouth:  Mucous membranes are moist.  Cardiovascular:     Rate and Rhythm: Tachycardia present.  Pulmonary:     Effort: Pulmonary effort is normal. No respiratory distress.     Breath sounds: Examination of the right-lower field reveals rales. Examination of the left-lower field reveals rales. Rales present.  Abdominal:     Palpations: Abdomen is soft.     Tenderness: There is abdominal tenderness.     Comments: Diffusely TTP, intermittently guarding, no distention  Musculoskeletal:     Right lower leg: No edema.     Left lower leg: No edema.  Skin:    General: Skin is warm.  Neurological:     Mental Status: He is alert and oriented to person, place, and time. Mental status is at baseline.  Psychiatric:        Mood and Affect: Mood normal.     ED Results / Procedures / Treatments   Labs (all labs ordered are listed, but only abnormal results are displayed) Labs Reviewed  BASIC METABOLIC PANEL - Abnormal; Notable for the following components:      Result Value   Potassium 3.2 (*)    Creatinine, Ser 1.40 (*)    Calcium 8.7 (*)    GFR, Estimated 58 (*)    All other components within normal limits  CBC - Abnormal; Notable for the following components:   WBC 10.9 (*)    RBC 3.72 (*)    Hemoglobin 11.7 (*)    HCT 34.7 (*)    RDW 16.3 (*)    Platelets 123 (*)    All other components within normal limits  HEPATIC FUNCTION PANEL - Abnormal; Notable for the following components:   Total Protein 8.8 (*)    AST 102 (*)    Bilirubin, Direct 0.3 (*)    All other components within normal limits  TROPONIN I (HIGH SENSITIVITY)  TROPONIN I (HIGH SENSITIVITY)    EKG EKG Interpretation  Date/Time:  Sunday June 09 2022 20:13:22 EDT Ventricular Rate:  107 PR Interval:  147 QRS Duration: 101 QT Interval:  341 QTC Calculation: 455 R Axis:   94 Text Interpretation: Sinus tachycardia Probable left atrial enlargement Borderline right axis deviation Confirmed by Coralee Pesa [phone]) on  06/09/2022 9:24:11 PM  Radiology DG Chest 2 View  Result Date: 06/09/2022 CLINICAL DATA:  Chest pain, shortness of breath. EXAM: CHEST - 2 VIEW COMPARISON:  06/06/2022. FINDINGS: The heart is enlarged and mediastinal contours are stable. There is atherosclerotic calcification of the aorta. Pulmonary vasculature is mildly distended. Mild airspace disease is present at the lung bases bilaterally. No effusion or pneumothorax. No acute osseous abnormality. IMPRESSION: 1. Distended pulmonary vasculature. 2. Airspace disease at the lung bases, possible edema or infiltrate. Electronically Signed   By: Charlestine Night.D.  On: 06/09/2022 20:49    Procedures Procedures    Medications Ordered in ED Medications  sodium chloride 0.9 % bolus 500 mL (500 mLs Intravenous New Bag/Given 06/09/22 2143)    ED Course/ Medical Decision Making/ A&P                             Medical Decision Making Amount and/or Complexity of Data Reviewed Labs: ordered. Radiology: ordered.   57 year old male who is a poor historian presents emergency department complaining of chest, back and abdominal pain.  This started about 6 hours prior to arrival, currently ongoing.  He is tachycardic on arrival, stable blood pressure.  EKG shows sinus tachycardia, no acute changes when compared to previous.  On physical exam he is diffusely tender to the abdomen, intermittently guarding, pulses in all 4 extremities are palpable and intact  Blood work is reassuring, around his baseline, initial troponin is negative at 9, BNP is normal.  Patient is pending CTA of the chest/abdomen and pelvis.  He continues to be tachycardic.  Pain medicine and continued IV fluids ordered. Patient signed out pending CTA.        Final Clinical Impression(s) / ED Diagnoses Final diagnoses:  None    Rx / DC Orders ED Discharge Orders     None         Rozelle Logan, DO 06/09/22 2308

## 2022-06-09 NOTE — ED Provider Notes (Signed)
Care assumed from Dr. Wilkie Aye, patient with chest and abdomen pain pending CT of chest/abdomen/pelvis. If scans do not show significant disease, he is safe for discharge.  CT angiogram shows right middle lobe and right lower lobe pneumonia.  This accounts for his chest pain.  He is also noted to have a low-grade fever and tachycardia which are consistent with pneumonia.  I have independently viewed the images, and agree with the radiologist's interpretation.  On reviewing his laboratory test, a also note that he had an acute kidney injury.  I have ordered IV fluids, IV antibiotics.  He is also noted to have hypokalemia and I have ordered a dose of oral potassium.  I have discussed the case with Dr. Julian Reil of [facility], who agrees to admit the patient.  Results for orders placed or performed during the hospital encounter of 06/09/22  Basic metabolic panel  Result Value Ref Range   Sodium 136 135 - 145 mmol/L   Potassium 3.2 (L) 3.5 - 5.1 mmol/L   Chloride 99 98 - 111 mmol/L   CO2 22 22 - 32 mmol/L   Glucose, Bld 93 70 - 99 mg/dL   BUN 13 6 - 20 mg/dL   Creatinine, Ser 8.11 (H) 0.61 - 1.24 mg/dL   Calcium 8.7 (L) 8.9 - 10.3 mg/dL   GFR, Estimated 58 (L) >60 mL/min   Anion gap 15 5 - 15  CBC  Result Value Ref Range   WBC 10.9 (H) 4.0 - 10.5 K/uL   RBC 3.72 (L) 4.22 - 5.81 MIL/uL   Hemoglobin 11.7 (L) 13.0 - 17.0 g/dL   HCT 91.4 (L) 78.2 - 95.6 %   MCV 93.3 80.0 - 100.0 fL   MCH 31.5 26.0 - 34.0 pg   MCHC 33.7 30.0 - 36.0 g/dL   RDW 21.3 (H) 08.6 - 57.8 %   Platelets 123 (L) 150 - 400 K/uL   nRBC 0.0 0.0 - 0.2 %  Hepatic function panel  Result Value Ref Range   Total Protein 8.8 (H) 6.5 - 8.1 g/dL   Albumin 3.6 3.5 - 5.0 g/dL   AST 469 (H) 15 - 41 U/L   ALT 32 0 - 44 U/L   Alkaline Phosphatase 117 38 - 126 U/L   Total Bilirubin 1.0 0.3 - 1.2 mg/dL   Bilirubin, Direct 0.3 (H) 0.0 - 0.2 mg/dL   Indirect Bilirubin 0.7 0.3 - 0.9 mg/dL  Brain natriuretic peptide  Result Value  Ref Range   B Natriuretic Peptide 17.3 0.0 - 100.0 pg/mL  Troponin I (High Sensitivity)  Result Value Ref Range   Troponin I (High Sensitivity) 9 <18 ng/L  Troponin I (High Sensitivity)  Result Value Ref Range   Troponin I (High Sensitivity) 10 <18 ng/L   CT Angio Chest/Abd/Pel for Dissection W and/or W/WO  Result Date: 06/10/2022 CLINICAL DATA:  Chest pain, shortness of breath EXAM: CT ANGIOGRAPHY CHEST, ABDOMEN AND PELVIS TECHNIQUE: Non-contrast CT of the chest was initially obtained. Multidetector CT imaging through the chest, abdomen and pelvis was performed using the standard protocol during bolus administration of intravenous contrast. Multiplanar reconstructed images and MIPs were obtained and reviewed to evaluate the vascular anatomy. RADIATION DOSE REDUCTION: This exam was performed according to the departmental dose-optimization program which includes automated exposure control, adjustment of the mA and/or kV according to patient size and/or use of iterative reconstruction technique. CONTRAST:  OMNIPAQUE IOHEXOL 350 MG/ML SOLN COMPARISON:  CT abdomen/pelvis dated 06/07/2022 FINDINGS: CTA CHEST FINDINGS Cardiovascular:  On unenhanced CT, there is no evidence of intramural hematoma. Following contrast administration, there is no evidence of thoracic aortic aneurysm or dissection. Mild atherosclerotic calcifications of the aortic arch. Although not tailored for evaluation of the pulmonary arteries, there is no evidence of central pulmonary embolism. The heart is normal in size.  No pericardial effusion. Mild coronary atherosclerosis of the LAD. Mediastinum/Nodes: No suspicious mediastinal lymphadenopathy. Visualized thyroid is unremarkable. Lungs/Pleura: Multifocal patchy opacities with right middle and lower lobe consolidation. Additional mild left lower lobe patchy opacity. This appearance is compatible with multifocal pneumonia and is new from the recent prior CT abdomen/pelvis. No  suspicious pulmonary nodules. No pleural effusion or pneumothorax. Musculoskeletal: Visualized osseous structures are within normal limits. Review of the MIP images confirms the above findings. CTA ABDOMEN AND PELVIS FINDINGS VASCULAR Aorta: No evidence of abdominal aortic aneurysm or dissection. Patent. Atherosclerotic calcifications. Celiac: Patent. SMA: Patent.  Atherosclerotic calcifications at the origin. Renals: Patent bilaterally. Atherosclerotic calcifications of the origin on the left. IMA: Patent. Inflow: Patent bilaterally.  Atherosclerotic calcifications. Veins: Grossly unremarkable. Review of the MIP images confirms the above findings. NON-VASCULAR Hepatobiliary: Moderate hepatic steatosis with focal fatty sparing along the gallbladder fossa. Gallbladder is unremarkable. No intrahepatic or extrahepatic ductal dilatation. Pancreas: Mild fluid/inflammatory changes along the pancreaticoduodenal groove (series 7/image 175), less conspicuous than on the recent prior, favored to be improved. Spleen: Within normal limits. Adrenals/Urinary Tract: Adrenal glands are within normal limits. Kidneys are within normal limits.  No hydronephrosis. Bladder is within normal limits. Stomach/Bowel: Stomach is within normal limits. No evidence of bowel obstruction. Normal appendix (series 7/image 233). No colonic wall thickening or inflammatory changes. Lymphatic: No suspicious abdominopelvic lymphadenopathy. Reproductive: Mild enlargement of the central gland of the prostate, suggesting BPH. Other: No abdominopelvic ascites. Musculoskeletal: Degenerative changes at L5-S1. Review of the MIP images confirms the above findings. IMPRESSION: No thoracoabdominal aortic aneurysm or dissection. Multifocal pneumonia, right middle and lower lobe predominant, new from the recent prior CT abdomen/pelvis. Improving groove pancreatitis. Moderate hepatic steatosis. Electronically Signed   By: Charline Bills M.D.   On: 06/10/2022 02:33    DG Chest 2 View  Result Date: 06/09/2022 CLINICAL DATA:  Chest pain, shortness of breath. EXAM: CHEST - 2 VIEW COMPARISON:  06/06/2022. FINDINGS: The heart is enlarged and mediastinal contours are stable. There is atherosclerotic calcification of the aorta. Pulmonary vasculature is mildly distended. Mild airspace disease is present at the lung bases bilaterally. No effusion or pneumothorax. No acute osseous abnormality. IMPRESSION: 1. Distended pulmonary vasculature. 2. Airspace disease at the lung bases, possible edema or infiltrate. Electronically Signed   By: Thornell Sartorius M.D.   On: 06/09/2022 20:49   CT ABDOMEN PELVIS W CONTRAST  Result Date: 06/07/2022 CLINICAL DATA:  Abdominal pain, acute, nonlocalized.  Fall. EXAM: CT ABDOMEN AND PELVIS WITH CONTRAST TECHNIQUE: Multidetector CT imaging of the abdomen and pelvis was performed using the standard protocol following bolus administration of intravenous contrast. RADIATION DOSE REDUCTION: This exam was performed according to the departmental dose-optimization program which includes automated exposure control, adjustment of the mA and/or kV according to patient size and/or use of iterative reconstruction technique. CONTRAST:  76mL OMNIPAQUE IOHEXOL 350 MG/ML SOLN COMPARISON:  04/11/2022 FINDINGS: Lower chest: No acute abnormality Hepatobiliary: Diffuse low-density throughout the liver compatible with fatty infiltration. No focal abnormality. Gallbladder unremarkable. Pancreas: Stranding in the fat between the pancreatic head and the 2nd portion of the duodenum, similar prior study. Few punctate calcifications within the pancreatic head. No ductal  dilatation. Spleen: No focal abnormality.  Normal size. Adrenals/Urinary Tract: Mild bladder wall thickening. No renal or adrenal mass. No hydronephrosis. Stomach/Bowel: Inflammation adjacent to the 2nd portion of the duodenum. No evidence of bowel obstruction. Remainder of bowel unremarkable. Vascular/Lymphatic:  Aortic atherosclerosis. No evidence of aneurysm or adenopathy. Reproductive: No visible focal abnormality. Other: No free fluid or free air. Musculoskeletal: No acute bony abnormality. IMPRESSION: Continued peripancreatic inflammation adjacent to the pancreatic head between the pancreatic head and duodenal C loop. Findings could reflect acute focal pancreatitis or duodenitis. Fatty liver. Aortic atherosclerosis. Mild bladder wall thickening which may be related to decompression, but recommend clinical correlation to exclude cystitis. Electronically Signed   By: Charlett Nose M.D.   On: 06/07/2022 03:37   CT Head Wo Contrast  Result Date: 06/06/2022 CLINICAL DATA:  Head trauma, moderate-severe; Neck trauma, midline tenderness (Age 61-64y) EXAM: CT HEAD WITHOUT CONTRAST CT CERVICAL SPINE WITHOUT CONTRAST TECHNIQUE: Multidetector CT imaging of the head and cervical spine was performed following the standard protocol without intravenous contrast. Multiplanar CT image reconstructions of the cervical spine were also generated. RADIATION DOSE REDUCTION: This exam was performed according to the departmental dose-optimization program which includes automated exposure control, adjustment of the mA and/or kV according to patient size and/or use of iterative reconstruction technique. COMPARISON:  None Available. FINDINGS: CT HEAD FINDINGS Brain: No evidence of acute infarction, hemorrhage, hydrocephalus, extra-axial collection or mass lesion/mass effect. Vascular: No hyperdense vessel. Skull: No acute fracture. Sinuses/Orbits: Remote appearing left medial orbital wall fracture. No acute orbital finding. Other: No mastoid effusions. CT CERVICAL SPINE FINDINGS Alignment: Mild reversal of the normal cervical lordosis. Mild likely degenerative retrolisthesis of C3 on C4 and C4 on C5. Skull base and vertebrae: No evidence of acute fracture. Vertebral body heights are maintained. Soft tissues and spinal canal: No prevertebral  fluid or swelling. No visible canal hematoma. Disc levels: Multilevel facet and uncovertebral hypertrophy with varying degrees of neural foraminal stenosis. Upper chest: Visualized lung apices are clear. IMPRESSION: No evidence of acute abnormality intracranially or in the cervical spine. Electronically Signed   By: Feliberto Harts M.D.   On: 06/06/2022 14:47   CT Cervical Spine Wo Contrast  Result Date: 06/06/2022 CLINICAL DATA:  Head trauma, moderate-severe; Neck trauma, midline tenderness (Age 61-64y) EXAM: CT HEAD WITHOUT CONTRAST CT CERVICAL SPINE WITHOUT CONTRAST TECHNIQUE: Multidetector CT imaging of the head and cervical spine was performed following the standard protocol without intravenous contrast. Multiplanar CT image reconstructions of the cervical spine were also generated. RADIATION DOSE REDUCTION: This exam was performed according to the departmental dose-optimization program which includes automated exposure control, adjustment of the mA and/or kV according to patient size and/or use of iterative reconstruction technique. COMPARISON:  None Available. FINDINGS: CT HEAD FINDINGS Brain: No evidence of acute infarction, hemorrhage, hydrocephalus, extra-axial collection or mass lesion/mass effect. Vascular: No hyperdense vessel. Skull: No acute fracture. Sinuses/Orbits: Remote appearing left medial orbital wall fracture. No acute orbital finding. Other: No mastoid effusions. CT CERVICAL SPINE FINDINGS Alignment: Mild reversal of the normal cervical lordosis. Mild likely degenerative retrolisthesis of C3 on C4 and C4 on C5. Skull base and vertebrae: No evidence of acute fracture. Vertebral body heights are maintained. Soft tissues and spinal canal: No prevertebral fluid or swelling. No visible canal hematoma. Disc levels: Multilevel facet and uncovertebral hypertrophy with varying degrees of neural foraminal stenosis. Upper chest: Visualized lung apices are clear. IMPRESSION: No evidence of acute  abnormality intracranially or in the cervical spine. Electronically Signed  By: Feliberto Harts M.D.   On: 06/06/2022 14:47   DG Chest 2 View  Result Date: 06/06/2022 CLINICAL DATA:  pain after fall EXAM: CHEST - 2 VIEW COMPARISON:  Radiograph 04/16/2022 FINDINGS: Unchanged cardiomediastinal silhouette. There is no focal airspace consolidation. There is no pleural effusion or evidence of pneumothorax. No acute osseous abnormality. IMPRESSION: No evidence of acute cardiopulmonary disease. Electronically Signed   By: Caprice Renshaw M.D.   On: 06/06/2022 14:39   DG Shoulder Left  Result Date: 06/06/2022 CLINICAL DATA:  pain after fall EXAM: LEFT SHOULDER - 2+ VIEW COMPARISON:  None Available. FINDINGS: There is no evidence of acute fracture. Alignment is normal. 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 IMPRESSION: No acute fracture or dislocation. 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 Electronically Signed   By: Caprice Renshaw M.D.   On: 06/06/2022 14:37   DG Knee Complete 4 Views Left  Result Date: 05/22/2022 CLINICAL DATA:  Larey Seat.  Left knee pain. EXAM: LEFT KNEE - COMPLETE 4+ VIEW COMPARISON:  12/25/2021 FINDINGS: The joint spaces are maintained. No acute fracture is identified. No joint effusion. Stable vascular calcifications. IMPRESSION: No acute bony findings or joint effusion. Electronically Signed   By: Rudie Meyer M.D.   On: 05/22/2022 14:08   DG Knee Complete 4 Views Right  Result Date: 05/22/2022 CLINICAL DATA:  Pain after fall EXAM: RIGHT KNEE - COMPLETE 4 VIEW COMPARISON:  X-ray 12/25/2021 FINDINGS: No evidence of fracture, dislocation, or joint effusion. No evidence of arthropathy or other focal bone abnormality. Soft tissues are unremarkable. Few nonspecific soft tissue calcifications are stable. Possibly vascular. IMPRESSION: No acute osseous abnormality Electronically Signed   By: Karen Kays M.D.   On: 05/22/2022 13:55      Dione Booze, MD 06/10/22 0500

## 2022-06-09 NOTE — ED Triage Notes (Signed)
Pt arrived via [facility] for chest pain, shortness of breath, and abdominal pain from [employer]. Pt reported symptoms started around 3pm today. Pt reports sharp, constant, right sided chest/flank/back pain, 10/10. Pt was administered 1SL Nitro without symptom relief. 4mg  Zofran administered via 22g left hand.   Pt also c/o dizziness.  88 - 90% on RA that increased to 93% on 2L

## 2022-06-09 NOTE — ED Notes (Signed)
Patient transported to X-ray 

## 2022-06-10 ENCOUNTER — Emergency Department (HOSPITAL_COMMUNITY): Payer: Medicaid Other

## 2022-06-10 ENCOUNTER — Encounter (HOSPITAL_COMMUNITY): Payer: Self-pay | Admitting: Internal Medicine

## 2022-06-10 DIAGNOSIS — K859 Acute pancreatitis without necrosis or infection, unspecified: Secondary | ICD-10-CM | POA: Diagnosis present

## 2022-06-10 DIAGNOSIS — R6521 Severe sepsis with septic shock: Secondary | ICD-10-CM

## 2022-06-10 DIAGNOSIS — E860 Dehydration: Secondary | ICD-10-CM | POA: Diagnosis present

## 2022-06-10 DIAGNOSIS — Z5900 Homelessness unspecified: Secondary | ICD-10-CM

## 2022-06-10 DIAGNOSIS — F10939 Alcohol use, unspecified with withdrawal, unspecified: Secondary | ICD-10-CM

## 2022-06-10 DIAGNOSIS — E871 Hypo-osmolality and hyponatremia: Secondary | ICD-10-CM | POA: Diagnosis present

## 2022-06-10 DIAGNOSIS — G894 Chronic pain syndrome: Secondary | ICD-10-CM

## 2022-06-10 DIAGNOSIS — J189 Pneumonia, unspecified organism: Principal | ICD-10-CM

## 2022-06-10 DIAGNOSIS — G9340 Encephalopathy, unspecified: Secondary | ICD-10-CM

## 2022-06-10 DIAGNOSIS — F17210 Nicotine dependence, cigarettes, uncomplicated: Secondary | ICD-10-CM | POA: Diagnosis present

## 2022-06-10 DIAGNOSIS — D6959 Other secondary thrombocytopenia: Secondary | ICD-10-CM | POA: Diagnosis present

## 2022-06-10 DIAGNOSIS — J9601 Acute respiratory failure with hypoxia: Secondary | ICD-10-CM | POA: Diagnosis present

## 2022-06-10 DIAGNOSIS — E876 Hypokalemia: Secondary | ICD-10-CM

## 2022-06-10 DIAGNOSIS — Y908 Blood alcohol level of 240 mg/100 ml or more: Secondary | ICD-10-CM | POA: Diagnosis present

## 2022-06-10 DIAGNOSIS — K2920 Alcoholic gastritis without bleeding: Secondary | ICD-10-CM | POA: Diagnosis present

## 2022-06-10 DIAGNOSIS — Z79899 Other long term (current) drug therapy: Secondary | ICD-10-CM | POA: Diagnosis not present

## 2022-06-10 DIAGNOSIS — I10 Essential (primary) hypertension: Secondary | ICD-10-CM | POA: Diagnosis present

## 2022-06-10 DIAGNOSIS — F1010 Alcohol abuse, uncomplicated: Secondary | ICD-10-CM

## 2022-06-10 DIAGNOSIS — K8689 Other specified diseases of pancreas: Secondary | ICD-10-CM

## 2022-06-10 DIAGNOSIS — Z1152 Encounter for screening for COVID-19: Secondary | ICD-10-CM | POA: Diagnosis not present

## 2022-06-10 DIAGNOSIS — K861 Other chronic pancreatitis: Secondary | ICD-10-CM | POA: Diagnosis present

## 2022-06-10 DIAGNOSIS — A419 Sepsis, unspecified organism: Secondary | ICD-10-CM

## 2022-06-10 DIAGNOSIS — F10131 Alcohol abuse with withdrawal delirium: Secondary | ICD-10-CM | POA: Diagnosis present

## 2022-06-10 DIAGNOSIS — N170 Acute kidney failure with tubular necrosis: Secondary | ICD-10-CM | POA: Diagnosis present

## 2022-06-10 DIAGNOSIS — N179 Acute kidney failure, unspecified: Secondary | ICD-10-CM | POA: Diagnosis present

## 2022-06-10 LAB — CBC WITH DIFFERENTIAL/PLATELET
Abs Immature Granulocytes: 0.4 10*3/uL — ABNORMAL HIGH (ref 0.00–0.07)
Band Neutrophils: 28 %
Basophils Absolute: 0 10*3/uL (ref 0.0–0.1)
Basophils Relative: 0 %
Eosinophils Absolute: 0 10*3/uL (ref 0.0–0.5)
Eosinophils Relative: 0 %
HCT: 29.7 % — ABNORMAL LOW (ref 39.0–52.0)
Hemoglobin: 10.1 g/dL — ABNORMAL LOW (ref 13.0–17.0)
Lymphocytes Relative: 6 %
Lymphs Abs: 0.8 10*3/uL (ref 0.7–4.0)
MCH: 31.5 pg (ref 26.0–34.0)
MCHC: 34 g/dL (ref 30.0–36.0)
MCV: 92.5 fL (ref 80.0–100.0)
Monocytes Absolute: 0.1 10*3/uL (ref 0.1–1.0)
Monocytes Relative: 1 %
Myelocytes: 3 %
Neutro Abs: 12.7 10*3/uL — ABNORMAL HIGH (ref 1.7–7.7)
Neutrophils Relative %: 62 %
Platelets: 105 10*3/uL — ABNORMAL LOW (ref 150–400)
RBC: 3.21 MIL/uL — ABNORMAL LOW (ref 4.22–5.81)
RDW: 16.2 % — ABNORMAL HIGH (ref 11.5–15.5)
WBC Morphology: INCREASED
WBC: 14.1 10*3/uL — ABNORMAL HIGH (ref 4.0–10.5)
nRBC: 0 % (ref 0.0–0.2)

## 2022-06-10 LAB — COMPREHENSIVE METABOLIC PANEL
ALT: 26 U/L (ref 0–44)
AST: 79 U/L — ABNORMAL HIGH (ref 15–41)
Albumin: 2.9 g/dL — ABNORMAL LOW (ref 3.5–5.0)
Alkaline Phosphatase: 90 U/L (ref 38–126)
Anion gap: 12 (ref 5–15)
BUN: 7 mg/dL (ref 6–20)
CO2: 21 mmol/L — ABNORMAL LOW (ref 22–32)
Calcium: 8 mg/dL — ABNORMAL LOW (ref 8.9–10.3)
Chloride: 96 mmol/L — ABNORMAL LOW (ref 98–111)
Creatinine, Ser: 0.75 mg/dL (ref 0.61–1.24)
GFR, Estimated: >60 mL/min (ref >60)
Glucose, Bld: 102 mg/dL — ABNORMAL HIGH (ref 70–99)
Potassium: 3.5 mmol/L (ref 3.5–5.1)
Sodium: 129 mmol/L — ABNORMAL LOW (ref 135–145)
Total Bilirubin: 1.4 mg/dL — ABNORMAL HIGH (ref 0.3–1.2)
Total Protein: 7.3 g/dL (ref 6.5–8.1)

## 2022-06-10 LAB — GLUCOSE, CAPILLARY
Glucose-Capillary: 105 mg/dL — ABNORMAL HIGH (ref 70–99)
Glucose-Capillary: 81 mg/dL (ref 70–99)
Glucose-Capillary: 95 mg/dL (ref 70–99)

## 2022-06-10 LAB — RESPIRATORY PANEL BY PCR

## 2022-06-10 LAB — LACTIC ACID, PLASMA
Lactic Acid, Venous: 2.1 mmol/L (ref 0.5–1.9)
Lactic Acid, Venous: 2.3 mmol/L (ref 0.5–1.9)
Lactic Acid, Venous: 1.9 mmol/L (ref 0.5–1.9)

## 2022-06-10 LAB — STREP PNEUMONIAE URINARY ANTIGEN: Strep Pneumo Urinary Antigen: NEGATIVE

## 2022-06-10 LAB — URINALYSIS, ROUTINE W REFLEX MICROSCOPIC
Bacteria, UA: NONE SEEN
Bilirubin Urine: NEGATIVE
Glucose, UA: NEGATIVE mg/dL
Hgb urine dipstick: NEGATIVE
Ketones, ur: NEGATIVE mg/dL
Leukocytes,Ua: NEGATIVE
Nitrite: NEGATIVE
Protein, ur: NEGATIVE mg/dL
Specific Gravity, Urine: 1.009 (ref 1.005–1.030)
pH: 7 (ref 5.0–8.0)

## 2022-06-10 LAB — TROPONIN I (HIGH SENSITIVITY): Troponin I (High Sensitivity): 10 ng/L (ref <18)

## 2022-06-10 LAB — MRSA NEXT GEN BY PCR, NASAL: MRSA by PCR Next Gen: NOT DETECTED

## 2022-06-10 LAB — CREATININE, SERUM
Creatinine, Ser: 0.71 mg/dL (ref 0.61–1.24)
GFR, Estimated: >60 mL/min (ref >60)

## 2022-06-10 LAB — SARS CORONAVIRUS 2 BY RT PCR: SARS Coronavirus 2 by RT PCR: NEGATIVE

## 2022-06-10 LAB — MAGNESIUM: Magnesium: 1.4 mg/dL — ABNORMAL LOW (ref 1.7–2.4)

## 2022-06-10 LAB — LIPASE, BLOOD: Lipase: 28 U/L (ref 11–51)

## 2022-06-10 MED ORDER — LACTATED RINGERS IV BOLUS
1000.0000 mL | Freq: Once | INTRAVENOUS | Status: AC
  Administered 2022-06-10: 1000 mL via INTRAVENOUS

## 2022-06-10 MED ORDER — MAGNESIUM SULFATE 4 GM/100ML IV SOLN
4.0000 g | Freq: Once | INTRAVENOUS | Status: AC
  Administered 2022-06-10: 4 g via INTRAVENOUS
  Filled 2022-06-10: qty 100

## 2022-06-10 MED ORDER — SODIUM CHLORIDE 0.9 % IV SOLN
500.0000 mg | Freq: Once | INTRAVENOUS | Status: AC
  Administered 2022-06-10: 500 mg via INTRAVENOUS
  Filled 2022-06-10: qty 5

## 2022-06-10 MED ORDER — LOPERAMIDE HCL 2 MG PO CAPS: 2.0000 mg | ORAL_CAPSULE | ORAL | Status: DC | PRN

## 2022-06-10 MED ORDER — ENOXAPARIN SODIUM 40 MG/0.4ML IJ SOSY
40.0000 mg | PREFILLED_SYRINGE | INTRAMUSCULAR | Status: DC
  Administered 2022-06-10: 40 mg via SUBCUTANEOUS
  Filled 2022-06-10: qty 0.4

## 2022-06-10 MED ORDER — SENNOSIDES-DOCUSATE SODIUM 8.6-50 MG PO TABS: 1.0000 | ORAL_TABLET | Freq: Every evening | ORAL | Status: DC | PRN

## 2022-06-10 MED ORDER — ONDANSETRON HCL 4 MG PO TABS: 4.0000 mg | ORAL_TABLET | Freq: Four times a day (QID) | ORAL | Status: DC | PRN

## 2022-06-10 MED ORDER — CHLORHEXIDINE GLUCONATE CLOTH 2 % EX PADS
6.0000 | MEDICATED_PAD | Freq: Every day | CUTANEOUS | Status: DC
  Administered 2022-06-10 – 2022-06-12 (×3): 6 via TOPICAL

## 2022-06-10 MED ORDER — FERROUS SULFATE 325 (65 FE) MG PO TABS
325.0000 mg | ORAL_TABLET | Freq: Every day | ORAL | Status: DC
  Administered 2022-06-10 – 2022-06-12 (×3): 325 mg via ORAL
  Filled 2022-06-10 (×3): qty 1

## 2022-06-10 MED ORDER — CHLORDIAZEPOXIDE HCL 25 MG PO CAPS: 25.0000 mg | ORAL_CAPSULE | Freq: Every day | ORAL | Status: DC

## 2022-06-10 MED ORDER — LORATADINE 10 MG PO TABS
10.0000 mg | ORAL_TABLET | Freq: Every day | ORAL | Status: DC
  Administered 2022-06-10 – 2022-06-12 (×3): 10 mg via ORAL
  Filled 2022-06-10 (×3): qty 1

## 2022-06-10 MED ORDER — IOHEXOL 350 MG/ML SOLN
100.0000 mL | Freq: Once | INTRAVENOUS | Status: AC | PRN
  Administered 2022-06-10: 100 mL via INTRAVENOUS

## 2022-06-10 MED ORDER — MORPHINE SULFATE (PF) 2 MG/ML IV SOLN
2.0000 mg | INTRAVENOUS | Status: DC | PRN
  Administered 2022-06-10 (×2): 2 mg via INTRAVENOUS
  Filled 2022-06-10 (×2): qty 1

## 2022-06-10 MED ORDER — GUAIFENESIN ER 600 MG PO TB12
1200.0000 mg | ORAL_TABLET | Freq: Two times a day (BID) | ORAL | Status: DC
  Administered 2022-06-10 – 2022-06-12 (×4): 1200 mg via ORAL
  Filled 2022-06-10 (×6): qty 2

## 2022-06-10 MED ORDER — DOCUSATE SODIUM 100 MG PO CAPS: 100.0000 mg | ORAL_CAPSULE | Freq: Two times a day (BID) | ORAL | Status: DC | PRN

## 2022-06-10 MED ORDER — NICOTINE 14 MG/24HR TD PT24
14.0000 mg | MEDICATED_PATCH | Freq: Every day | TRANSDERMAL | Status: DC
  Administered 2022-06-10 – 2022-06-11 (×2): 14 mg via TRANSDERMAL
  Filled 2022-06-10 (×3): qty 1

## 2022-06-10 MED ORDER — ONDANSETRON 4 MG PO TBDP: 4.0000 mg | ORAL_TABLET | Freq: Four times a day (QID) | ORAL | Status: DC | PRN

## 2022-06-10 MED ORDER — SODIUM CHLORIDE 0.9 % IV SOLN
2.0000 g | INTRAVENOUS | Status: DC
  Administered 2022-06-11: 2 g via INTRAVENOUS
  Filled 2022-06-10: qty 20

## 2022-06-10 MED ORDER — SODIUM CHLORIDE 0.9 % IV BOLUS
1000.0000 mL | Freq: Once | INTRAVENOUS | Status: AC
  Administered 2022-06-10: 1000 mL via INTRAVENOUS

## 2022-06-10 MED ORDER — ENOXAPARIN SODIUM 40 MG/0.4ML IJ SOSY
40.0000 mg | PREFILLED_SYRINGE | INTRAMUSCULAR | Status: DC
  Administered 2022-06-10 – 2022-06-11 (×2): 40 mg via SUBCUTANEOUS
  Filled 2022-06-10 (×2): qty 0.4

## 2022-06-10 MED ORDER — ONDANSETRON HCL 4 MG/2ML IJ SOLN: 4.0000 mg | Freq: Four times a day (QID) | INTRAMUSCULAR | Status: DC | PRN

## 2022-06-10 MED ORDER — ADULT MULTIVITAMIN W/MINERALS CH: 1.0000 | ORAL_TABLET | Freq: Every day | ORAL | Status: DC

## 2022-06-10 MED ORDER — THIAMINE HCL 100 MG/ML IJ SOLN: 100.0000 mg | Freq: Once | INTRAMUSCULAR | Status: DC

## 2022-06-10 MED ORDER — LACTATED RINGERS IV SOLN: INTRAVENOUS | Status: DC

## 2022-06-10 MED ORDER — PANTOPRAZOLE SODIUM 40 MG PO TBEC
40.0000 mg | DELAYED_RELEASE_TABLET | Freq: Two times a day (BID) | ORAL | Status: DC
  Administered 2022-06-10 – 2022-06-12 (×5): 40 mg via ORAL
  Filled 2022-06-10 (×5): qty 1

## 2022-06-10 MED ORDER — ACETAMINOPHEN 650 MG RE SUPP: 650.0000 mg | Freq: Four times a day (QID) | RECTAL | Status: DC | PRN

## 2022-06-10 MED ORDER — IPRATROPIUM BROMIDE 0.02 % IN SOLN
0.5000 mg | Freq: Three times a day (TID) | RESPIRATORY_TRACT | Status: DC
  Administered 2022-06-10 – 2022-06-12 (×6): 0.5 mg via RESPIRATORY_TRACT
  Filled 2022-06-10 (×8): qty 2.5

## 2022-06-10 MED ORDER — POLYETHYLENE GLYCOL 3350 17 G PO PACK: 17.0000 g | PACK | Freq: Every day | ORAL | Status: DC | PRN

## 2022-06-10 MED ORDER — CHLORDIAZEPOXIDE HCL 25 MG PO CAPS: 25.0000 mg | ORAL_CAPSULE | ORAL | Status: DC

## 2022-06-10 MED ORDER — AMLODIPINE BESYLATE 5 MG PO TABS
5.0000 mg | ORAL_TABLET | Freq: Every day | ORAL | Status: DC
  Administered 2022-06-10 – 2022-06-12 (×3): 5 mg via ORAL
  Filled 2022-06-10 (×3): qty 1

## 2022-06-10 MED ORDER — FOLIC ACID 1 MG PO TABS
1.0000 mg | ORAL_TABLET | Freq: Every day | ORAL | Status: DC
  Administered 2022-06-10 – 2022-06-12 (×3): 1 mg via ORAL
  Filled 2022-06-10 (×3): qty 1

## 2022-06-10 MED ORDER — SODIUM CHLORIDE 0.9 % IV SOLN
500.0000 mg | INTRAVENOUS | Status: DC
  Administered 2022-06-11: 500 mg via INTRAVENOUS
  Filled 2022-06-10 (×2): qty 5

## 2022-06-10 MED ORDER — HEPARIN SODIUM (PORCINE) 5000 UNIT/ML IJ SOLN: 5000.0000 [IU] | Freq: Three times a day (TID) | INTRAMUSCULAR | Status: DC

## 2022-06-10 MED ORDER — ADULT MULTIVITAMIN W/MINERALS CH
1.0000 | ORAL_TABLET | Freq: Every day | ORAL | Status: DC
  Administered 2022-06-10 – 2022-06-12 (×3): 1 via ORAL
  Filled 2022-06-10 (×3): qty 1

## 2022-06-10 MED ORDER — FLUTICASONE PROPIONATE 50 MCG/ACT NA SUSP
2.0000 | Freq: Every day | NASAL | Status: DC
  Administered 2022-06-11 – 2022-06-12 (×2): 2 via NASAL
  Filled 2022-06-10: qty 16

## 2022-06-10 MED ORDER — PANCRELIPASE (LIP-PROT-AMYL) 12000-38000 UNITS PO CPEP
12000.0000 [IU] | ORAL_CAPSULE | Freq: Three times a day (TID) | ORAL | Status: DC
  Administered 2022-06-10 – 2022-06-12 (×6): 12000 [IU] via ORAL
  Filled 2022-06-10 (×9): qty 1

## 2022-06-10 MED ORDER — LORAZEPAM 2 MG/ML IJ SOLN
1.0000 mg | INTRAMUSCULAR | Status: DC | PRN
  Administered 2022-06-11: 2 mg via INTRAVENOUS
  Filled 2022-06-10 (×2): qty 1

## 2022-06-10 MED ORDER — CHLORDIAZEPOXIDE HCL 25 MG PO CAPS: 25.0000 mg | ORAL_CAPSULE | Freq: Four times a day (QID) | ORAL | Status: DC | PRN

## 2022-06-10 MED ORDER — SUCRALFATE 1 G PO TABS
1.0000 g | ORAL_TABLET | Freq: Three times a day (TID) | ORAL | Status: DC
  Administered 2022-06-10 – 2022-06-12 (×7): 1 g via ORAL
  Filled 2022-06-10 (×12): qty 1

## 2022-06-10 MED ORDER — ENOXAPARIN SODIUM 40 MG/0.4ML IJ SOSY: 40.0000 mg | PREFILLED_SYRINGE | INTRAMUSCULAR | Status: DC

## 2022-06-10 MED ORDER — LEVALBUTEROL HCL 0.63 MG/3ML IN NEBU
0.6300 mg | INHALATION_SOLUTION | Freq: Three times a day (TID) | RESPIRATORY_TRACT | Status: DC
  Administered 2022-06-10 – 2022-06-12 (×6): 0.63 mg via RESPIRATORY_TRACT
  Filled 2022-06-10 (×7): qty 3

## 2022-06-10 MED ORDER — THIAMINE HCL 100 MG/ML IJ SOLN
100.0000 mg | Freq: Every day | INTRAMUSCULAR | Status: DC
  Filled 2022-06-10 (×2): qty 2

## 2022-06-10 MED ORDER — CHLORDIAZEPOXIDE HCL 25 MG PO CAPS: 25.0000 mg | ORAL_CAPSULE | Freq: Three times a day (TID) | ORAL | Status: DC

## 2022-06-10 MED ORDER — ACETAMINOPHEN 325 MG PO TABS
650.0000 mg | ORAL_TABLET | Freq: Four times a day (QID) | ORAL | Status: DC | PRN
  Administered 2022-06-10: 650 mg via ORAL
  Filled 2022-06-10: qty 2

## 2022-06-10 MED ORDER — THIAMINE MONONITRATE 100 MG PO TABS
100.0000 mg | ORAL_TABLET | Freq: Every day | ORAL | Status: DC
  Administered 2022-06-10 – 2022-06-12 (×3): 100 mg via ORAL
  Filled 2022-06-10 (×3): qty 1

## 2022-06-10 MED ORDER — LORAZEPAM 1 MG PO TABS
1.0000 mg | ORAL_TABLET | ORAL | Status: DC | PRN
  Administered 2022-06-10: 1 mg via ORAL
  Administered 2022-06-10 (×3): 2 mg via ORAL
  Administered 2022-06-10 (×3): 1 mg via ORAL
  Filled 2022-06-10: qty 2
  Filled 2022-06-10: qty 1
  Filled 2022-06-10: qty 2
  Filled 2022-06-10 (×3): qty 1
  Filled 2022-06-10: qty 2

## 2022-06-10 MED ORDER — HYDROXYZINE HCL 25 MG PO TABS
25.0000 mg | ORAL_TABLET | Freq: Four times a day (QID) | ORAL | Status: DC | PRN
  Administered 2022-06-10: 25 mg via ORAL
  Filled 2022-06-10: qty 1

## 2022-06-10 MED ORDER — SODIUM CHLORIDE 0.9 % IV SOLN
2.0000 g | Freq: Once | INTRAVENOUS | Status: AC
  Administered 2022-06-10: 2 g via INTRAVENOUS
  Filled 2022-06-10: qty 20

## 2022-06-10 MED ORDER — ERYTHROMYCIN 5 MG/GM OP OINT
1.0000 | TOPICAL_OINTMENT | Freq: Every day | OPHTHALMIC | Status: DC
  Administered 2022-06-11 (×2): 1 via OPHTHALMIC
  Filled 2022-06-10: qty 3.5

## 2022-06-10 MED ORDER — POTASSIUM CHLORIDE CRYS ER 20 MEQ PO TBCR
40.0000 meq | EXTENDED_RELEASE_TABLET | Freq: Once | ORAL | Status: AC
  Administered 2022-06-10: 40 meq via ORAL
  Filled 2022-06-10: qty 2

## 2022-06-10 MED ORDER — SENNOSIDES-DOCUSATE SODIUM 8.6-50 MG PO TABS
1.0000 | ORAL_TABLET | Freq: Two times a day (BID) | ORAL | Status: DC
  Administered 2022-06-10 – 2022-06-11 (×3): 1 via ORAL
  Filled 2022-06-10 (×3): qty 1

## 2022-06-10 MED ORDER — OFLOXACIN 0.3 % OP SOLN
1.0000 [drp] | Freq: Four times a day (QID) | OPHTHALMIC | Status: DC
  Administered 2022-06-10 – 2022-06-12 (×7): 1 [drp] via OPHTHALMIC
  Filled 2022-06-10: qty 5

## 2022-06-10 MED ORDER — CHLORDIAZEPOXIDE HCL 25 MG PO CAPS
25.0000 mg | ORAL_CAPSULE | Freq: Four times a day (QID) | ORAL | Status: AC
  Administered 2022-06-10 (×4): 25 mg via ORAL
  Filled 2022-06-10 (×4): qty 1

## 2022-06-10 NOTE — ED Notes (Signed)
Waiting on CCM to see patient, will not go to progressive until assessment

## 2022-06-10 NOTE — Hospital Course (Signed)
Patient 57 year old gentleman history of significant alcohol abuse, chronic pancreatitis, homelessness, chronic abdominal pain presented to the ED with complaints of chest pain, back pain, abdominal pain, nausea, intermittent shortness of breath with no cough.  No lower extremity edema. CT chest abdomen and pelvis done concerning for multifocal pneumonia on the right.  Patient also noted to be in acute kidney injury.  Patient admitted for further management with IV antibiotics, IV fluids, supportive care.  Patient also with concerns for significant alcohol withdrawal was placed on the Ativan withdrawal protocol.  Librium detox protocol also added.  PCCM consulted for further evaluation due to concerns patient may require Precedex drip.

## 2022-06-10 NOTE — Assessment & Plan Note (Addendum)
-   Patient presented with fever, mild WBC elevation.  Likely due to PNA as demonstrated on CT scan. -Check a sputum Gram stain and culture, check a urine pneumococcus antigen, check a urine Legionella antigen. -Check blood cultures x 2. -Repeat lactic acid level. -Continue empiric IV Rocephin, IV azithromycin. -Add Mucinex, Flonase, Claritin, PPI, scheduled Xopenex and Atrovent nebs. -SLP evaluation to rule out aspiration pneumonia. -Supportive care.

## 2022-06-10 NOTE — ED Notes (Signed)
Report called to ICU Lewis RN states that he will call back

## 2022-06-10 NOTE — Consult Note (Signed)
NAME:  Samuel Blevins, MRN:  [id number], DOB:  14-Oct-1965, LOS: 0 ADMISSION DATE:  06/09/2022, CONSULTATION DATE:  06/10/22 REFERRING MD:  Janee Morn, CHIEF COMPLAINT:  ETOH withdrawal   History of Present Illness:  Samuel Blevins is a 57 y.o. M with PMH significant for ETOH abuse, HTN, thrombocytopenia who presented to the ED with chest and abdominal pain and shortness of breath from [facility] on 4/15.  He was found to have CAP and pancreatitis.  Troponins were flat, ETOH 358, lipase 62, creatinine 1.4, lactic acid 2.1.  A CT abd/pelvis pelvis was obtained showing peripancreatic inflammation.  He was treated with 3L IVF, azithromycin and ceftriaxone and admitted to [facility].   He required over 7mg  Ativan and Librium and remained restless and tachycardic, so PCCM consulted.  Repeat CT chest/abd/pelvis for dissection showed multi-focal pneumonia and was requiring 2L O2   Pertinent  Medical History   has a past medical history of Alcohol withdrawal syndrome with complication, Alcoholism, Elevated AST (SGOT), Gastrointestinal hemorrhage, Homeless, Hypertension, Pancreatic insufficiency, Symptomatic anemia (11/23/2015), and Thrombocytopenia (05/09/2021).   Significant Hospital Events: Including procedures, antibiotic start and stop dates in addition to other pertinent events   4/15 admit for pancreatitis, CAP, ETOH withdrawal, PCCM consult  Interim History / Subjective:  Pt awake and oriented to person and place, currently resting in bed with tremors, but not acutely agitated  Objective   Blood pressure (!) 141/77, pulse (!) 132, temperature 98.7 F (37.1 C), temperature source Oral, resp. rate (!) 30, height 6\' 1"  (1.854 m), weight 77.1 kg, SpO2 95 %.       No intake or output data in the 24 hours ending 06/10/22 1420 Filed Weights   06/09/22 2013  Weight: 77.1 kg    General:  acutely ill-appearing M, resting in bed in no acute distress HEENT: MM pink/moist, sclera injected Neuro:  somnolent, slurred speech, bilateral UE tremors, oriented to person and place and following commands CV: s1s2 rrr, no m/r/g PULM:  diminished in the bases, no crackles or wheezing on 2L  GI: soft, distended, non-tender to palpation Extremities: warm/dry, no edema  Skin: no rashes or lesions   Resolved Hospital Problem list     Assessment & Plan:    Acute Encephalopathy ETOH withdrawal Unclear how much he drinks or when last use was, ETOH level elevated on admission at 358 -continue CIWA protocol, has received about 7mg  Ativan today, currently calm but has a high risk of decompensating, so will transfer to ICU -may need precedex, will hold off Librium in the setting of mildly elevated transaminases  -thiamine, folate, MV   Acute Hypoxic Respiratory Failure  Sepsis secondary to CAP Currently on 2L Multi-focal PNA on CT -lactic rising slightly, trend, received 30cc/kg IVF and on 150cc/hr -follow blood cultures, check covid/flu and RVP -if producing sputum can culture -urine strep and legionella  Possible acute pancreatitis  Peri-pancreatic inflammation on CT with lipase of 62 Exam benign -continue IVF, NPO  Acute on chronic thrombocytopenia Platelets 105 -likely secondary to sepsis and ETOH -trend and monitor for bleeding, may need to hold heparin prophy  Elevated transaminases  Hyperbilirubinemia  AST 79, bili 1.4 -secondary to ETOH and pancreatitis -improving  Best Practice (right click and "Reselect all SmartList Selections" daily)   Diet/type: NPO w/ oral meds DVT prophylaxis: prophylactic heparin  GI prophylaxis: N/A Lines: N/A Foley:  N/A Code Status:  full code Last date of multidisciplinary goals of care discussion [pending]  Labs   CBC: Recent Labs  Lab 06/06/22 1355 06/09/22 2026 06/10/22 1145  WBC 4.1 10.9* 14.1*  NEUTROABS  --   --  12.7*  HGB 10.8* 11.7* 10.1*  HCT 31.5* 34.7* 29.7*  MCV 92.1 93.3 92.5  PLT 109* 123* 105*    Basic  Metabolic Panel: Recent Labs  Lab 06/06/22 1355 06/09/22 2026 06/09/22 2138 06/10/22 1048  NA 138 136  --  129*  K 3.0* 3.2*  --  3.5  CL 99 99  --  96*  CO2 24 22  --  21*  GLUCOSE 123* 93  --  102*  BUN 5* 13  --  7  CREATININE 0.52* 1.40*  --  0.75  CALCIUM 8.6* 8.7*  --  8.0*  MG  --   --  1.4*  --    GFR: Estimated Creatinine Clearance: 109.8 mL/min (by C-G formula based on SCr of 0.75 mg/dL). Recent Labs  Lab 06/06/22 1355 06/09/22 2026 06/10/22 0335 06/10/22 1043 06/10/22 1145  WBC 4.1 10.9*  --   --  14.1*  LATICACIDVEN  --   --  2.1* 2.3*  --     Liver Function Tests: Recent Labs  Lab 06/07/22 0600 06/09/22 2026 06/10/22 1048  AST 109* 102* 79*  ALT 33 32 26  ALKPHOS 111 117 90  BILITOT 0.9 1.0 1.4*  PROT 8.5* 8.8* 7.3  ALBUMIN 3.4* 3.6 2.9*   Recent Labs  Lab 06/07/22 0600 06/10/22 1048  LIPASE 62* 28   No results for input(s): "AMMONIA" in the last 168 hours.  ABG    Component Value Date/Time   TCO2 23 08/26/2020 1141     Coagulation Profile: No results for input(s): "INR", "PROTIME" in the last 168 hours.  Cardiac Enzymes: No results for input(s): "CKTOTAL", "CKMB", "CKMBINDEX", "TROPONINI" in the last 168 hours.  HbA1C: Hgb A1c MFr Bld  Date/Time Value Ref Range Status  08/02/2021 12:47 PM 5.2 4.8 - 5.6 % Final    Comment:             Prediabetes: 5.7 - 6.4          Diabetes: >6.4          Glycemic control for adults with diabetes: <7.0     CBG: No results for input(s): "GLUCAP" in the last 168 hours.  Review of Systems:   Please see the history of present illness. All other systems reviewed and are negative    Past Medical History:  He,  has a past medical history of Alcohol withdrawal syndrome with complication, Alcoholism, Elevated AST (SGOT), Gastrointestinal hemorrhage, Homeless, Hypertension, Pancreatic insufficiency, Symptomatic anemia (11/23/2015), and Thrombocytopenia (05/09/2021).   Surgical History:   Past  Surgical History:  Procedure Laterality Date   BIOPSY  06/23/2019   Procedure: BIOPSY;  Surgeon: Shellia Cleverly, DO;  Location: [facility] ENDOSCOPY;  Service: Gastroenterology;;   BIOPSY  12/12/2020   Procedure: BIOPSY;  Surgeon: Lynann Bologna, MD;  Location: [facility] ENDOSCOPY;  Service: Endoscopy;;   BIOPSY  08/04/2021   Procedure: BIOPSY;  Surgeon: Jenel Lucks, MD;  Location: [facility] ENDOSCOPY;  Service: Gastroenterology;;   ESOPHAGOGASTRODUODENOSCOPY N/A 08/04/2021   Procedure: ESOPHAGOGASTRODUODENOSCOPY (EGD);  Surgeon: Jenel Lucks, MD;  Location: [facility] ENDOSCOPY;  Service: Gastroenterology;  Laterality: N/A;   ESOPHAGOGASTRODUODENOSCOPY (EGD) WITH PROPOFOL N/A 06/23/2019   Procedure: ESOPHAGOGASTRODUODENOSCOPY (EGD) WITH PROPOFOL;  Surgeon: Shellia Cleverly, DO;  Location: [facility] ENDOSCOPY;  Service: Gastroenterology;  Laterality: N/A;   ESOPHAGOGASTRODUODENOSCOPY (EGD) WITH PROPOFOL N/A 12/12/2020   Procedure: ESOPHAGOGASTRODUODENOSCOPY (EGD) WITH  PROPOFOL;  Surgeon: Lynann Bologna, MD;  Location: [facility] ENDOSCOPY;  Service: Endoscopy;  Laterality: N/A;   HOT HEMOSTASIS N/A 06/23/2019   Procedure: HOT HEMOSTASIS (ARGON PLASMA COAGULATION/BICAP);  Surgeon: Shellia Cleverly, DO;  Location: [facility] ENDOSCOPY;  Service: Gastroenterology;  Laterality: N/A;     Social History:   reports that he has been smoking cigarettes. He has a 15.00 pack-year smoking history. He has never used smokeless tobacco. He reports current alcohol use of about 28.0 standard drinks of alcohol per week. He reports that he does not use drugs.   Family History:  His family history is negative for Diabetes Mellitus II, Colon cancer, Stomach cancer, and Pancreatic cancer.   Allergies No Known Allergies   Home Medications  Prior to Admission medications   Medication Sig Start Date End Date Taking? Authorizing Provider  erythromycin ophthalmic ointment Place 1 Application into both eyes at bedtime. 06/05/22  Yes [name],  MD  acetaminophen (TYLENOL) 325 MG tablet Take 2 tablets (650 mg total) by mouth every 6 (six) hours as needed for fever, moderate pain, mild pain or headache. 12/12/20   Katsadouros, Vasilios, MD  amLODipine (NORVASC) 5 MG tablet Take 1 tablet (5 mg total) by mouth daily. 04/18/22   Lyndle Herrlich, MD  clobetasol cream (TEMOVATE) 0.05 % Apply 1 Application topically 2 (two) times daily. 03/06/22   [name], MD  folic acid (FOLVITE) 1 MG tablet Take 1 tablet (1 mg total) by mouth daily. 10/17/21   [name], MD  Iron, Ferrous Sulfate, 325 (65 Fe) MG TABS Take 1 tablet ( 325 mg) by mouth daily. 05/24/22   [name], MD  lipase/protease/amylase (CREON) 12000-38000 units CPEP capsule Take 1 capsule (12,000 Units total) by mouth 3 (three) times daily before meals. 04/18/22   Lyndle Herrlich, MD  Multiple Vitamin (MULTIVITAMIN WITH MINERALS) TABS tablet Take 1 tablet by mouth daily. 09/27/21   [name], MD  ofloxacin (OCUFLOX) 0.3 % ophthalmic solution Place 1 drop into both eyes 4 (four) times daily. 03/20/22   [name], MD  ondansetron (ZOFRAN) 8 MG tablet Take 1 tablet (8 mg total) by mouth every 6 (six) hours as needed for nausea. 01/23/22   [name], MD  pantoprazole (PROTONIX) 40 MG tablet Take 1 tablet (40 mg total) by mouth 2 (two) times daily before a meal. 04/18/22   Lyndle Herrlich, MD  polyethylene glycol (MIRALAX / GLYCOLAX) 17 g packet Take 17 g by mouth daily as needed for mild constipation or moderate constipation. 10/07/21   Rocky Morel, DO  senna-docusate (SENOKOT-S) 8.6-50 MG tablet Take 1 tablet by mouth at bedtime as needed for mild constipation. 10/07/21   Rocky Morel, DO  sucralfate (CARAFATE) 1 g tablet Take 1 tablet (1 g total) by mouth 4 (four) times daily -  with meals and at bedtime. 12/27/21   [name], MD  thiamine (VITAMIN B1) 100 MG tablet Take 1 tablet (100 mg total) by mouth daily. 10/02/21    [name], MD     Critical care time:   35 minutes    CRITICAL CARE Performed by: Darcella Gasman Alontae Chaloux   Total critical care time: 35 minutes  Critical care time was exclusive of separately billable procedures and treating other patients.  Critical care was necessary to treat or prevent imminent or life-threatening deterioration.  Critical care was time spent personally by me on the following activities: development of treatment plan with patient and/or  surrogate as well as nursing, discussions with consultants, evaluation of patient's response to treatment, examination of patient, obtaining history from patient or surrogate, ordering and performing treatments and interventions, ordering and review of laboratory studies, ordering and review of radiographic studies, pulse oximetry and re-evaluation of patient's condition.   Darcella Gasman Eeva Schlosser, PA-C [facility] See Amion for pager If no response to pager , please call [phone] until 7pm After 7:00 pm call Elink  [phone]

## 2022-06-10 NOTE — Progress Notes (Signed)
PROGRESS NOTE    Samuel Blevins  KGS:[id number] DOB: 11-29-1965 DOA: 06/09/2022 PCP: [name], MD    Chief Complaint  Patient presents with   Chest Pain   Shortness of Breath   Abdominal Pain    Brief Narrative:  Patient 57 year old gentleman history of significant alcohol abuse, chronic pancreatitis, homelessness, chronic abdominal pain presented to the ED with complaints of chest pain, back pain, abdominal pain, nausea, intermittent shortness of breath with no cough.  No lower extremity edema. CT chest abdomen and pelvis done concerning for multifocal pneumonia on the right.  Patient also noted to be in acute kidney injury.  Patient admitted for further management with IV antibiotics, IV fluids, supportive care.  Patient also with concerns for significant alcohol withdrawal was placed on the Ativan withdrawal protocol.  Librium detox protocol also added.  PCCM consulted for further evaluation due to concerns patient may require Precedex drip.    Assessment & Plan:  Principal Problem:   CAP (community acquired pneumonia) Active Problems:   Chronic alcohol abuse   AKI (acute kidney injury)   Chronic pain syndrome   Pancreatic insufficiency   Homeless   Alcohol withdrawal   Hypomagnesemia   Encephalopathy acute   Sepsis with acute organ dysfunction and septic shock   Acute on chronic pancreatitis   Acute kidney injury (nontraumatic)    Assessment and Plan: * CAP (community acquired pneumonia) - Patient presented with fever, mild WBC elevation.  Likely due to PNA as demonstrated on CT scan. -Check a sputum Gram stain and culture, check a urine pneumococcus antigen, check a urine Legionella antigen. -Check blood cultures x 2. -Repeat lactic acid level. -Continue empiric IV Rocephin, IV azithromycin. -Add Mucinex, Flonase, Claritin, PPI, scheduled Xopenex and Atrovent nebs. -SLP evaluation to rule out aspiration pneumonia. -Supportive care.  AKI (acute kidney  injury) Probably pre-renal / ATN in setting of PNA -Check a UA. -Renal function improving with hydration. -Replete electrolytes. -Labs in the AM.  Chronic alcohol abuse CIWA High risk for withdrawal which he has had during multiple admits in past. Cont PPI and carafate for chronic EtOH gastritis -Added Librium detox protocol. -See alcohol withdrawal above.  Homeless -TOC Consult  Pancreatic insufficiency Cont pancreatic enzymes with meals.  Chronic pain syndrome Pt with chronic abdominal pain due to EtOH gastritis and chronic pancreatitis. -Patient with acute on chronic pain greater in the epigastrium and lower abdominal region. -Check a lipase level. -Check a bladder scan to r/o urinary retention. - 1 L NS bolus. -Increase IVF to NS 150cc/hr. -IV Morphine prn.  Acute on chronic pancreatitis -Patient presented with chest pain, abdominal pain with significant epigastric abdominal pain states worse than his chronic pain. -Patient presented with nausea and no emesis. -Check a lipase level. -Downgrade diet to clear liquids. -1 L normal saline bolus. -Increase IV fluids to normal saline at 125 cc an hour. -IV morphine as needed.  Hypomagnesemia Magnesium at 1.4 on presentation. Magnesium sulfate 4 g IV x 1. Repeat labs in the AM.  Alcohol withdrawal -Patient with diffuse tremors, noted to be tachycardic, concerned that patient currently in withdrawal. -Continue Ativan withdrawal protocol. -Add Librium detox protocol. -Continue thiamine, folic acid, multivitamin. -IV fluids. -Patient noted to have used about 7 mg of Ativan between 8 AM and 1 PM. -Progressive floor feels patient will be better served in the ICU. -Consult with PCCM for further evaluation, may need to be placed on the Precedex drip.  DVT prophylaxis: Heparin>>>>> Lovenox Code Status: Full Family Communication: Updated patient.  No family at bedside. Disposition: Change admission status to  progressive.  Disposition to be determined.  Status is: Inpatient The patient will require care spanning > 2 midnights and should be moved to inpatient because: Severity of illness   Consultants:  PCCM  Procedures:  CT angiogram chest abdomen and pelvis 06/10/2022 Chest x-ray 06/09/2022  Antimicrobials:  IV azithromycin 4/15 /2024>>>>> 06/16/2022 IV Rocephin 06/10/2022>>> 06/16/2022   Subjective: Patient laying on gurney in ED.  Complain of diffuse pain in the shoulder and abdomen.  Patient with tremors.  Some improvement with shortness of breath.  States when he coughs he might have some occasional hemoptysis.  Objective: Vitals:   06/10/22 1200 06/10/22 1215 06/10/22 1230 06/10/22 1409  BP: (!) 174/98   (!) 141/77  Pulse: (!) 142 87 (!) 145 (!) 132  Resp:    (!) 30  Temp:    98.7 F (37.1 C)  TempSrc:    Oral  SpO2: 95% (!) 63% 96% 95%  Weight:      Height:       No intake or output data in the 24 hours ending 06/10/22 1601 Filed Weights   06/09/22 2013  Weight: 77.1 kg    Examination:  General exam: Diffuse tremors noted. Respiratory system: Some scattered coarse breath sounds on the right.  No wheezing.  No crackles.  Fair air movement.   Cardiovascular system: Tachycardia.  No JVD.  No lower extremity edema.  Gastrointestinal system: Abdomen is nondistended, soft and tender to palpation significantly in the epigastrium as well as the suprapubic region and diffusely.  Central nervous system: Alert and oriented.  Tremors noted.  No focal neurological deficits. Extremities: Symmetric 5 x 5 power. Skin: No rashes, lesions or ulcers Psychiatry: Judgement and insight appear normal. Mood & affect appropriate.     Data Reviewed:   CBC: Recent Labs  Lab 06/06/22 1355 06/09/22 2026 06/10/22 1145  WBC 4.1 10.9* 14.1*  NEUTROABS  --   --  12.7*  HGB 10.8* 11.7* 10.1*  HCT 31.5* 34.7* 29.7*  MCV 92.1 93.3 92.5  PLT 109* 123* 105*    Basic Metabolic  Panel: Recent Labs  Lab 06/06/22 1355 06/09/22 2026 06/09/22 2138 06/10/22 1048  NA 138 136  --  129*  K 3.0* 3.2*  --  3.5  CL 99 99  --  96*  CO2 24 22  --  21*  GLUCOSE 123* 93  --  102*  BUN 5* 13  --  7  CREATININE 0.52* 1.40*  --  0.75  CALCIUM 8.6* 8.7*  --  8.0*  MG  --   --  1.4*  --     GFR: Estimated Creatinine Clearance: 109.8 mL/min (by C-G formula based on SCr of 0.75 mg/dL).  Liver Function Tests: Recent Labs  Lab 06/07/22 0600 06/09/22 2026 06/10/22 1048  AST 109* 102* 79*  ALT 33 32 26  ALKPHOS 111 117 90  BILITOT 0.9 1.0 1.4*  PROT 8.5* 8.8* 7.3  ALBUMIN 3.4* 3.6 2.9*    CBG: No results for input(s): "GLUCAP" in the last 168 hours.   No results found for this or any previous visit (from the past 240 hour(s)).       Radiology Studies: CT Angio Chest/Abd/Pel for Dissection W and/or W/WO  Result Date: 06/10/2022 CLINICAL DATA:  Chest pain, shortness of breath EXAM: CT ANGIOGRAPHY CHEST, ABDOMEN AND PELVIS TECHNIQUE: Non-contrast CT of the  chest was initially obtained. Multidetector CT imaging through the chest, abdomen and pelvis was performed using the standard protocol during bolus administration of intravenous contrast. Multiplanar reconstructed images and MIPs were obtained and reviewed to evaluate the vascular anatomy. RADIATION DOSE REDUCTION: This exam was performed according to the departmental dose-optimization program which includes automated exposure control, adjustment of the mA and/or kV according to patient size and/or use of iterative reconstruction technique. CONTRAST:  OMNIPAQUE IOHEXOL 350 MG/ML SOLN COMPARISON:  CT abdomen/pelvis dated 06/07/2022 FINDINGS: CTA CHEST FINDINGS Cardiovascular: On unenhanced CT, there is no evidence of intramural hematoma. Following contrast administration, there is no evidence of thoracic aortic aneurysm or dissection. Mild atherosclerotic calcifications of the aortic arch. Although not tailored for  evaluation of the pulmonary arteries, there is no evidence of central pulmonary embolism. The heart is normal in size.  No pericardial effusion. Mild coronary atherosclerosis of the LAD. Mediastinum/Nodes: No suspicious mediastinal lymphadenopathy. Visualized thyroid is unremarkable. Lungs/Pleura: Multifocal patchy opacities with right middle and lower lobe consolidation. Additional mild left lower lobe patchy opacity. This appearance is compatible with multifocal pneumonia and is new from the recent prior CT abdomen/pelvis. No suspicious pulmonary nodules. No pleural effusion or pneumothorax. Musculoskeletal: Visualized osseous structures are within normal limits. Review of the MIP images confirms the above findings. CTA ABDOMEN AND PELVIS FINDINGS VASCULAR Aorta: No evidence of abdominal aortic aneurysm or dissection. Patent. Atherosclerotic calcifications. Celiac: Patent. SMA: Patent.  Atherosclerotic calcifications at the origin. Renals: Patent bilaterally. Atherosclerotic calcifications of the origin on the left. IMA: Patent. Inflow: Patent bilaterally.  Atherosclerotic calcifications. Veins: Grossly unremarkable. Review of the MIP images confirms the above findings. NON-VASCULAR Hepatobiliary: Moderate hepatic steatosis with focal fatty sparing along the gallbladder fossa. Gallbladder is unremarkable. No intrahepatic or extrahepatic ductal dilatation. Pancreas: Mild fluid/inflammatory changes along the pancreaticoduodenal groove (series 7/image 175), less conspicuous than on the recent prior, favored to be improved. Spleen: Within normal limits. Adrenals/Urinary Tract: Adrenal glands are within normal limits. Kidneys are within normal limits.  No hydronephrosis. Bladder is within normal limits. Stomach/Bowel: Stomach is within normal limits. No evidence of bowel obstruction. Normal appendix (series 7/image 233). No colonic wall thickening or inflammatory changes. Lymphatic: No suspicious abdominopelvic  lymphadenopathy. Reproductive: Mild enlargement of the central gland of the prostate, suggesting BPH. Other: No abdominopelvic ascites. Musculoskeletal: Degenerative changes at L5-S1. Review of the MIP images confirms the above findings. IMPRESSION: No thoracoabdominal aortic aneurysm or dissection. Multifocal pneumonia, right middle and lower lobe predominant, new from the recent prior CT abdomen/pelvis. Improving groove pancreatitis. Moderate hepatic steatosis. Electronically Signed   By: Charline Bills M.D.   On: 06/10/2022 02:33   DG Chest 2 View  Result Date: 06/09/2022 CLINICAL DATA:  Chest pain, shortness of breath. EXAM: CHEST - 2 VIEW COMPARISON:  06/06/2022. FINDINGS: The heart is enlarged and mediastinal contours are stable. There is atherosclerotic calcification of the aorta. Pulmonary vasculature is mildly distended. Mild airspace disease is present at the lung bases bilaterally. No effusion or pneumothorax. No acute osseous abnormality. IMPRESSION: 1. Distended pulmonary vasculature. 2. Airspace disease at the lung bases, possible edema or infiltrate. Electronically Signed   By: Thornell Sartorius M.D.   On: 06/09/2022 20:49        Scheduled Meds:  amLODipine  5 mg Oral Daily   chlordiazePOXIDE  25 mg Oral QID   Followed by   Melene Muller ON 06/11/2022] chlordiazePOXIDE  25 mg Oral TID   Followed by   Melene Muller ON 06/12/2022] chlordiazePOXIDE  25 mg Oral BH-qamhs   Followed by   Melene Muller ON 06/13/2022] chlordiazePOXIDE  25 mg Oral Daily   enoxaparin (LOVENOX) injection  40 mg Subcutaneous Q24H   erythromycin  1 Application Both Eyes QHS   ferrous sulfate  325 mg Oral Daily   fluticasone  2 spray Each Nare Daily   folic acid  1 mg Oral Daily   guaiFENesin  1,200 mg Oral BID   ipratropium  0.5 mg Nebulization Q8H   levalbuterol  0.63 mg Nebulization Q8H   lipase/protease/amylase  12,000 Units Oral TID AC   loratadine  10 mg Oral Daily   multivitamin with minerals  1 tablet Oral Daily    nicotine  14 mg Transdermal Daily   ofloxacin  1 drop Both Eyes QID   pantoprazole  40 mg Oral BID AC   senna-docusate  1 tablet Oral BID   sucralfate  1 g Oral TID WC & HS   thiamine  100 mg Oral Daily   Or   thiamine  100 mg Intravenous Daily   thiamine  100 mg Intramuscular Once   Continuous Infusions:  [START ON 06/11/2022] azithromycin     [START ON 06/11/2022] cefTRIAXone (ROCEPHIN)  IV     lactated ringers 150 mL/hr at 06/10/22 1455     LOS: 0 days    Time spent: 40 minutes    Ramiro Harvest, MD [facility]   To contact the attending provider between 7A-7P or the covering provider during after hours 7P-7A, please log into the web site [email or web address] and access using universal [place] password for that web site. If you do not have the password, please call the hospital operator.  06/10/2022, 4:01 PM

## 2022-06-10 NOTE — Assessment & Plan Note (Signed)
-  Patient with diffuse tremors, noted to be tachycardic, concerned that patient currently in withdrawal. -Continue Ativan withdrawal protocol. -Add Librium detox protocol. -Continue thiamine, folic acid, multivitamin. -IV fluids. -Patient noted to have used about 7 mg of Ativan between 8 AM and 1 PM. -Progressive floor feels patient will be better served in the ICU. -Consult with PCCM for further evaluation, may need to be placed on the Precedex drip.

## 2022-06-10 NOTE — ED Notes (Signed)
Pt care taken, resting gave water, waiting on ct results

## 2022-06-10 NOTE — Assessment & Plan Note (Signed)
-  Patient presented with chest pain, abdominal pain with significant epigastric abdominal pain states worse than his chronic pain. -Patient presented with nausea and no emesis. -Check a lipase level. -Downgrade diet to clear liquids. -1 L normal saline bolus. -Increase IV fluids to normal saline at 125 cc an hour. -IV morphine as needed.

## 2022-06-10 NOTE — Assessment & Plan Note (Signed)
-  TOC Consult

## 2022-06-10 NOTE — Assessment & Plan Note (Addendum)
Probably pre-renal / ATN in setting of PNA -Check a UA. -Renal function improving with hydration. -Replete electrolytes. -Labs in the AM.

## 2022-06-10 NOTE — Assessment & Plan Note (Signed)
Cont pancreatic enzymes with meals.

## 2022-06-10 NOTE — Evaluation (Signed)
Clinical/Bedside Swallow Evaluation Patient Details  Name: Samuel Blevins MRN: [id number] Date of Birth: 1965/04/06  Today's Date: 06/10/2022 Time: SLP Start Time (ACUTE ONLY): 1326 SLP Stop Time (ACUTE ONLY): 1338 SLP Time Calculation (min) (ACUTE ONLY): 12 min  Past Medical History:  Past Medical History:  Diagnosis Date   Alcohol withdrawal syndrome with complication    Alcoholism    Elevated AST (SGOT)    Gastrointestinal hemorrhage    Homeless    Hypertension    Pancreatic insufficiency    takes Creon   Symptomatic anemia 11/23/2015   Thrombocytopenia 05/09/2021   Past Surgical History:  Past Surgical History:  Procedure Laterality Date   BIOPSY  06/23/2019   Procedure: BIOPSY;  Surgeon: Shellia Cleverly, DO;  Location: [facility] ENDOSCOPY;  Service: Gastroenterology;;   BIOPSY  12/12/2020   Procedure: BIOPSY;  Surgeon: Lynann Bologna, MD;  Location: [facility] ENDOSCOPY;  Service: Endoscopy;;   BIOPSY  08/04/2021   Procedure: BIOPSY;  Surgeon: Jenel Lucks, MD;  Location: [facility] ENDOSCOPY;  Service: Gastroenterology;;   ESOPHAGOGASTRODUODENOSCOPY N/A 08/04/2021   Procedure: ESOPHAGOGASTRODUODENOSCOPY (EGD);  Surgeon: Jenel Lucks, MD;  Location: [facility]COPY;  Service: Gastroenterology;  Laterality: N/A;   ESOPHAGOGASTRODUODENOSCOPY (EGD) WITH PROPOFOL N/A 06/23/2019   Procedure: ESOPHAGOGASTRODUODENOSCOPY (EGD) WITH PROPOFOL;  Surgeon: Shellia Cleverly, DO;  Location: [facility] ENDOSCOPY;  Service: Gastroenterology;  Laterality: N/A;   ESOPHAGOGASTRODUODENOSCOPY (EGD) WITH PROPOFOL N/A 12/12/2020   Procedure: ESOPHAGOGASTRODUODENOSCOPY (EGD) WITH PROPOFOL;  Surgeon: Lynann Bologna, MD;  Location: [facility] ENDOSCOPY;  Service: Endoscopy;  Laterality: N/A;   HOT HEMOSTASIS N/A 06/23/2019   Procedure: HOT HEMOSTASIS (ARGON PLASMA COAGULATION/BICAP);  Surgeon: Shellia Cleverly, DO;  Location: [facility] ENDOSCOPY;  Service: Gastroenterology;  Laterality: N/A;   HPI:  Samuel Blevins is a 57 y.o. male  with medical history significant of EtOH abuse, homelessness, chronic abd pain, chronic pancreatitis. Pt presents to ED with c/o CP, back pain, and abd pain. Found to have ACP, AKI, chronic alcohol abuse. BSE 05/11/2021 with immediate cough after first trial without subsequent coughing with straw. Regular diet/thin liquids recommended.    Assessment / Plan / Recommendation  Clinical Impression  Pt seen for swallow assessment and currently on a clear liquid diet per MD. Majority of dentition is intact- difficult to fully view posterior. Pt was alert, cooperative and with tremors (DT's) needing assist with feeding. He declined to cough due to abdominal pain. Pt observed with consecutive straw sips water, juice, beef broth and jello.There was no coughing, throat clearing and vocal quality was clear throughout with coordinated swallows and respirations. Recommend pt continue clear liquids per MD and can advance as he is medically appropriate. No further ST is needed at this time. SLP Visit Diagnosis: Dysphagia, unspecified (R13.10)    Aspiration Risk  Mild aspiration risk    Diet Recommendation Thin liquid;Other (Comment) (clear liquids per MD)   Liquid Administration via: Straw Medication Administration: Whole meds with liquid Supervision: Staff to assist with self feeding Compensations: Slow rate;Small sips/bites Postural Changes: Seated upright at 90 degrees    Other  Recommendations Oral Care Recommendations: Oral care BID    Recommendations for follow up therapy are one component of a multi-disciplinary discharge planning process, led by the attending physician.  Recommendations may be updated based on patient status, additional functional criteria and insurance authorization.  Follow up Recommendations No SLP follow up      Assistance Recommended at Discharge    Functional Status Assessment    Frequency and Duration  Prognosis        Swallow Study   General Date of  Onset: 06/09/22 HPI: Samuel Blevins is a 57 y.o. male with medical history significant of EtOH abuse, homelessness, chronic abd pain, chronic pancreatitis. Pt presents to ED with c/o CP, back pain, and abd pain. Found to have ACP, AKI, chronic alcohol abuse. BSE 05/11/2021 with immediate cough after first trial without subsequent coughing with straw. Regular diet/thin liquids recommended. Type of Study: Bedside Swallow Evaluation Previous Swallow Assessment:  (see HPI) Diet Prior to this Study: Clear liquid diet;Thin liquids (Level 0) Temperature Spikes Noted: No Respiratory Status: Nasal cannula History of Recent Intubation: No Behavior/Cognition: Alert;Cooperative;Pleasant mood Oral Cavity Assessment: Within Functional Limits Oral Care Completed by SLP: No Oral Cavity - Dentition:  (difficult to fully view) Vision: Functional for self-feeding Self-Feeding Abilities: Needs assist (due to tremors) Patient Positioning: Upright in bed Baseline Vocal Quality: Normal Volitional Cough:  (pt declined due to abdominal pain)    Oral/Motor/Sensory Function Overall Oral Motor/Sensory Function: Within functional limits   Ice Chips Ice chips: Not tested   Thin Liquid Thin Liquid: Within functional limits Presentation: Straw    Nectar Thick Nectar Thick Liquid: Not tested   Honey Thick Honey Thick Liquid: Not tested   Puree Puree: Within functional limits (gave jello from tray- on clear liquids)   Solid     Solid: Not tested (on clear liquids)      Royce Macadamia 06/10/2022,2:04 PM

## 2022-06-10 NOTE — ED Notes (Signed)
ED TO INPATIENT HANDOFF REPORT  ED Nurse Name and Phone #: Donnella Bi [phone]  S Name/Age/Gender [name] 57 y.o. male Room/Bed: 036C/036C  Code Status   Code Status: Full Code  Home/SNF/Other Home Patient oriented to: self, place, time, and situation Is this baseline? Yes   Triage Complete: Triage complete  Chief Complaint CAP (community acquired pneumonia) [J18.9]  Triage Note Pt arrived via [facility] for chest pain, shortness of breath, and abdominal pain from [employer]. Pt reported symptoms started around 3pm today. Pt reports sharp, constant, right sided chest/flank/back pain, 10/10. Pt was administered 1SL Nitro without symptom relief.  Zofran administered via 22g left hand.   Pt also c/o dizziness.  88 - 90% on RA that increased to 93% on 2L   Allergies No Known Allergies  Level of Care/Admitting Diagnosis ED Disposition     ED Disposition  Admit   Condition  --   Comment  Hospital [facility] [[id number]]  Level of Care: Progressive [102]  Admit to Progressive based on following criteria: ACUTE MENTAL DISORDER-RELATED Drug/Alcohol Ingestion/Overdose/Withdrawal, Suicidal Ideation/attempt requiring safety sitter and < Q2h monitoring/assessments, moderate to severe agitation that is managed with medication/sitter, CIWA-Ar score < 20.  Admit to Progressive based on following criteria: RESPIRATORY PROBLEMS hypoxemic/hypercapnic respiratory failure that is responsive to NIPPV (BiPAP) or High Flow Nasal Cannula (6-80 lpm). Frequent assessment/intervention, no > Q2 hrs < Q4 hrs, to maintain oxygenation and pulmonary hygiene.  May admit patient to [facility] or [facility] if equivalent level of care is available:: No  Covid Evaluation: Asymptomatic - no recent exposure (last 10 days) testing not required  Diagnosis: CAP (community acquired pneumonia) [454098]  Admitting Physician: Rodolph Bong [[id number]]  Attending Physician:  Rodolph Bong [[id number]]  Certification:: I certify this patient will need inpatient services for at least 2 midnights  Estimated Length of Stay: 4          B Medical/Surgery History Past Medical History:  Diagnosis Date   Alcohol withdrawal syndrome with complication    Alcoholism    Elevated AST (SGOT)    Gastrointestinal hemorrhage    Homeless    Hypertension    Pancreatic insufficiency    takes Creon   Symptomatic anemia 11/23/2015   Thrombocytopenia 05/09/2021   Past Surgical History:  Procedure Laterality Date   BIOPSY  06/23/2019   Procedure: BIOPSY;  Surgeon: Shellia Cleverly, DO;  Location: [facility] ENDOSCOPY;  Service: Gastroenterology;;   BIOPSY  12/12/2020   Procedure: BIOPSY;  Surgeon: Lynann Bologna, MD;  Location[facility] ENDOSCOPY;  Service: Endoscopy;;   BIOPSY  08/04/2021   Procedure: BIOPSY;  Surgeon: Jenel Lucks, MD;  Location: [facility] ENDOSCOPY;  Service: Gastroenterology;;   ESOPHAGOGASTRODUODENOSCOPY N/A 08/04/2021   Procedure: ESOPHAGOGASTRODUODENOSCOPY (EGD);  Surgeon: Jenel Lucks, MD;  Location: [facility] ENDOSCOPY;  Service: Gastroenterology;  Laterality: N/A;   ESOPHAGOGASTRODUODENOSCOPY (EGD) WITH PROPOFOL N/A 06/23/2019   Procedure: ESOPHAGOGASTRODUODENOSCOPY (EGD) WITH PROPOFOL;  Surgeon: Shellia Cleverly, DO;  Location: [facility] ENDOSCOPY;  Service: Gastroenterology;  Laterality: N/A;   ESOPHAGOGASTRODUODENOSCOPY (EGD) WITH PROPOFOL N/A 12/12/2020   Procedure: ESOPHAGOGASTRODUODENOSCOPY (EGD) WITH PROPOFOL;  Surgeon: Lynann Bologna, MD;  Location: [facility] ENDOSCOPY;  Service: Endoscopy;  Laterality: N/A;   HOT HEMOSTASIS N/A 06/23/2019   Procedure: HOT HEMOSTASIS (ARGON PLASMA COAGULATION/BICAP);  Surgeon: Shellia Cleverly, DO;  Location: [facility] ENDOSCOPY;  Service: Gastroenterology;  Laterality: N/A;     A IV Location/Drains/Wounds Patient Lines/Drains/Airways Status     Active Line/Drains/Airways  Name Placement date Placement time Site Days   Peripheral  IV 06/09/22 22 G Left;Posterior Hand 06/09/22  2019  Hand  1   Peripheral IV 06/09/22 20 G Anterior;Proximal;Right Forearm 06/09/22  2144  Forearm  1            Intake/Output Last 24 hours No intake or output data in the 24 hours ending 06/10/22 1229  Labs/Imaging Results for orders placed or performed during the hospital encounter of 06/09/22 (from the past 48 hour(s))  Basic metabolic panel     Status: Abnormal   Collection Time: 06/09/22  8:26 PM  Result Value Ref Range   Sodium 136 135 - 145 mmol/L   Potassium 3.2 (L) 3.5 - 5.1 mmol/L   Chloride 99 98 - 111 mmol/L   CO2 22 22 - 32 mmol/L   Glucose, Bld 93 70 - 99 mg/dL    Comment: Glucose reference range applies only to samples taken after fasting for at least 8 hours.   BUN 13 6 - 20 mg/dL   Creatinine, Ser 1.61 (H) 0.61 - 1.24 mg/dL   Calcium 8.7 (L) 8.9 - 10.3 mg/dL   GFR, Estimated 58 (L) >60 mL/min    Comment: (NOTE) Calculated using the CKD-EPI Creatinine Equation (2021)    Anion gap 15 5 - 15    Comment: Performed at [facility], [address] [place], [place] [address]  CBC     Status: Abnormal   Collection Time: 06/09/22  8:26 PM  Result Value Ref Range   WBC 10.9 (H) 4.0 - 10.5 K/uL   RBC 3.72 (L) 4.22 - 5.81 MIL/uL   Hemoglobin 11.7 (L) 13.0 - 17.0 g/dL   HCT 54.0 (L) 98.1 - 19.1 %   MCV 93.3 80.0 - 100.0 fL   MCH 31.5 26.0 - 34.0 pg   MCHC 33.7 30.0 - 36.0 g/dL   RDW 47.8 (H) 29.5 - 62.1 %   Platelets 123 (L) 150 - 400 K/uL   nRBC 0.0 0.0 - 0.2 %    Comment: Performed at [facility], [address], [place], [place] [address]  Troponin I (High Sensitivity)     Status: None   Collection Time: 06/09/22  8:26 PM  Result Value Ref Range   Troponin I (High Sensitivity) 9 <18 ng/L    Comment: (NOTE) Elevated high sensitivity troponin I (hsTnI) values and significant  changes across serial measurements may suggest ACS but many other  chronic and acute conditions are known to elevate hsTnI  results.  Refer to the "Links" section for chest pain algorithms and additional  guidance. Performed at [facility], [address] [place], [place] [address]   Hepatic function panel     Status: Abnormal   Collection Time: 06/09/22  8:26 PM  Result Value Ref Range   Total Protein 8.8 (H) 6.5 - 8.1 g/dL   Albumin 3.6 3.5 - 5.0 g/dL   AST 629 (H) 15 - 41 U/L   ALT 32 0 - 44 U/L   Alkaline Phosphatase 117 38 - 126 U/L   Total Bilirubin 1.0 0.3 - 1.2 mg/dL   Bilirubin, Direct 0.3 (H) 0.0 - 0.2 mg/dL   Indirect Bilirubin 0.7 0.3 - 0.9 mg/dL    Comment: Performed at [facility], [address], [place], [place] [address]  Brain natriuretic peptide     Status: None   Collection Time: 06/09/22  8:26 PM  Result Value Ref Range  B Natriuretic Peptide 17.3 0.0 - 100.0 pg/mL    Comment: Performed at [facility], [address], [place], [place] [address]  Magnesium     Status: Abnormal   Collection Time: 06/09/22  9:38 PM  Result Value Ref Range   Magnesium 1.4 (L) 1.7 - 2.4 mg/dL    Comment: Performed at [facility], [address], [place], [place] [address]  Troponin I (High Sensitivity)     Status: None   Collection Time: 06/09/22 10:17 PM  Result Value Ref Range   Troponin I (High Sensitivity) 10 <18 ng/L    Comment: (NOTE) Elevated high sensitivity troponin I (hsTnI) values and significant  changes across serial measurements may suggest ACS but many other  chronic and acute conditions are known to elevate hsTnI results.  Refer to the "Links" section for chest pain algorithms and additional  guidance. Performed at [facility], [address] [place], [place] [address]   Lactic acid, plasma     Status: Abnormal   Collection Time: 06/10/22  3:35 AM  Result Value Ref Range   Lactic Acid, Venous 2.1 (HH) 0.5 - 1.9 mmol/L    Comment: CRITICAL RESULT CALLED TO, READ BACK BY AND VERIFIED WITH LOU MERRICKS RN 06/10/22 0425 [place] Derry Performed at  [facility], [address], [place], [place] [address]   Lactic acid, plasma     Status: Abnormal   Collection Time: 06/10/22 10:43 AM  Result Value Ref Range   Lactic Acid, Venous 2.3 (HH) 0.5 - 1.9 mmol/L    Comment: CRITICAL VALUE NOTED. VALUE IS CONSISTENT WITH PREVIOUSLY REPORTED/CALLED VALUE Performed at [facility], [address] [place], [place] [address]   Comprehensive metabolic panel     Status: Abnormal   Collection Time: 06/10/22 10:48 AM  Result Value Ref Range   Sodium 129 (L) 135 - 145 mmol/L   Potassium 3.5 3.5 - 5.1 mmol/L   Chloride 96 (L) 98 - 111 mmol/L   CO2 21 (L) 22 - 32 mmol/L   Glucose, Bld 102 (H) 70 - 99 mg/dL    Comment: Glucose reference range applies only to samples taken after fasting for at least 8 hours.   BUN 7 6 - 20 mg/dL   Creatinine, Ser 6.43 0.61 - 1.24 mg/dL   Calcium 8.0 (L) 8.9 - 10.3 mg/dL   Total Protein 7.3 6.5 - 8.1 g/dL   Albumin 2.9 (L) 3.5 - 5.0 g/dL   AST 79 (H) 15 - 41 U/L   ALT 26 0 - 44 U/L   Alkaline Phosphatase 90 38 - 126 U/L   Total Bilirubin 1.4 (H) 0.3 - 1.2 mg/dL   GFR, Estimated >32 >95 mL/min    Comment: (NOTE) Calculated using the CKD-EPI Creatinine Equation (2021)    Anion gap 12 5 - 15    Comment: Performed at [facility], [address], [place], [place] [address]  Lipase, blood     Status: None   Collection Time: 06/10/22 10:48 AM  Result Value Ref Range   Lipase 28 11 - 51 U/L    Comment: Performed at [facility], [address] [place], [place] [address]   CT Angio Chest/Abd/Pel for Dissection W and/or W/WO  Result Date: 06/10/2022 CLINICAL DATA:  Chest pain, shortness of breath EXAM: CT ANGIOGRAPHY CHEST, ABDOMEN AND PELVIS TECHNIQUE: Non-contrast CT of the chest was initially obtained. Multidetector CT imaging through the chest,  abdomen and pelvis was performed using the standard protocol during bolus administration of intravenous contrast. Multiplanar reconstructed images  and MIPs were obtained and reviewed to evaluate the vascular anatomy. RADIATION DOSE REDUCTION: This exam was performed according to the departmental dose-optimization program which includes automated exposure control, adjustment of the mA and/or kV according to patient size and/or use of iterative reconstruction technique. CONTRAST:  OMNIPAQUE IOHEXOL 350 MG/ML SOLN COMPARISON:  CT abdomen/pelvis dated 06/07/2022 FINDINGS: CTA CHEST FINDINGS Cardiovascular: On unenhanced CT, there is no evidence of intramural hematoma. Following contrast administration, there is no evidence of thoracic aortic aneurysm or dissection. Mild atherosclerotic calcifications of the aortic arch. Although not tailored for evaluation of the pulmonary arteries, there is no evidence of central pulmonary embolism. The heart is normal in size.  No pericardial effusion. Mild coronary atherosclerosis of the LAD. Mediastinum/Nodes: No suspicious mediastinal lymphadenopathy. Visualized thyroid is unremarkable. Lungs/Pleura: Multifocal patchy opacities with right middle and lower lobe consolidation. Additional mild left lower lobe patchy opacity. This appearance is compatible with multifocal pneumonia and is new from the recent prior CT abdomen/pelvis. No suspicious pulmonary nodules. No pleural effusion or pneumothorax. Musculoskeletal: Visualized osseous structures are within normal limits. Review of the MIP images confirms the above findings. CTA ABDOMEN AND PELVIS FINDINGS VASCULAR Aorta: No evidence of abdominal aortic aneurysm or dissection. Patent. Atherosclerotic calcifications. Celiac: Patent. SMA: Patent.  Atherosclerotic calcifications at the origin. Renals: Patent bilaterally. Atherosclerotic calcifications of the origin on the left. IMA: Patent. Inflow: Patent bilaterally.  Atherosclerotic calcifications. Veins: Grossly unremarkable. Review of the MIP images confirms the above findings. NON-VASCULAR Hepatobiliary: Moderate hepatic  steatosis with focal fatty sparing along the gallbladder fossa. Gallbladder is unremarkable. No intrahepatic or extrahepatic ductal dilatation. Pancreas: Mild fluid/inflammatory changes along the pancreaticoduodenal groove (series 7/image 175), less conspicuous than on the recent prior, favored to be improved. Spleen: Within normal limits. Adrenals/Urinary Tract: Adrenal glands are within normal limits. Kidneys are within normal limits.  No hydronephrosis. Bladder is within normal limits. Stomach/Bowel: Stomach is within normal limits. No evidence of bowel obstruction. Normal appendix (series 7/image 233). No colonic wall thickening or inflammatory changes. Lymphatic: No suspicious abdominopelvic lymphadenopathy. Reproductive: Mild enlargement of the central gland of the prostate, suggesting BPH. Other: No abdominopelvic ascites. Musculoskeletal: Degenerative changes at L5-S1. Review of the MIP images confirms the above findings. IMPRESSION: No thoracoabdominal aortic aneurysm or dissection. Multifocal pneumonia, right middle and lower lobe predominant, new from the recent prior CT abdomen/pelvis. Improving groove pancreatitis. Moderate hepatic steatosis. Electronically Signed   By: Charline Bills M.D.   On: 06/10/2022 02:33   DG Chest 2 View  Result Date: 06/09/2022 CLINICAL DATA:  Chest pain, shortness of breath. EXAM: CHEST - 2 VIEW COMPARISON:  06/06/2022. FINDINGS: The heart is enlarged and mediastinal contours are stable. There is atherosclerotic calcification of the aorta. Pulmonary vasculature is mildly distended. Mild airspace disease is present at the lung bases bilaterally. No effusion or pneumothorax. No acute osseous abnormality. IMPRESSION: 1. Distended pulmonary vasculature. 2. Airspace disease at the lung bases, possible edema or infiltrate. Electronically Signed   By: Thornell Sartorius M.D.   On: 06/09/2022 20:49    Pending Labs Unresulted Labs (From admission, onward)     Start     Ordered    06/11/22 0500  CBC  Tomorrow morning,   R        06/10/22 0502   06/11/22 0500  Comprehensive metabolic panel  Tomorrow morning,   R  06/10/22 0502   06/11/22 0500  Magnesium  Tomorrow morning,   R        06/10/22 0856   06/10/22 1145  CBC with Differential/Platelet  Once,   STAT        06/10/22 1145   06/10/22 0933  Urinalysis, Routine w reflex microscopic -Urine, Clean Catch  Once,   R       Question:  Specimen Source  Answer:  Urine, Clean Catch   06/10/22 0932   06/10/22 0933  Urine Culture (for pregnant, neutropenic or urologic patients or patients with an indwelling urinary catheter)  (Urine Labs)  Once,   R       Question:  Indication  Answer:  Suprapubic pain   06/10/22 0932   06/10/22 0930  Culture, blood (Routine X 2) w Reflex to ID Panel  BLOOD CULTURE X 2,   R (with TIMED occurrences)      06/10/22 0929   06/10/22 0928  CBC with Differential/Platelet  Once,   R        06/10/22 0927            Vitals/Pain Today's Vitals   06/10/22 1130 06/10/22 1145 06/10/22 1147 06/10/22 1148  BP: (!) 170/89 (!) 151/66  (!) 151/66  Pulse: (!) 140 (!) 146  (!) 146  Resp:      Temp:      TempSrc:      SpO2: 90% (!) 89%    Weight:      Height:      PainSc:   10-Worst pain ever     Isolation Precautions No active isolations  Medications Medications  LORazepam (ATIVAN) tablet 1-4 mg (1 mg Oral Given 06/10/22 1154)    Or  LORazepam (ATIVAN) injection 1-4 mg ( Intravenous See Alternative 06/10/22 1154)  thiamine (VITAMIN B1) tablet 100 mg (100 mg Oral Given 06/10/22 0908)    Or  thiamine (VITAMIN B1) injection 100 mg ( Intravenous See Alternative 06/10/22 0908)  folic acid (FOLVITE) tablet 1 mg (1 mg Oral Given 06/10/22 0908)  multivitamin with minerals tablet 1 tablet (1 tablet Oral Given 06/10/22 0908)  lipase/protease/amylase (CREON) capsule 12,000 Units (12,000 Units Oral Given 06/10/22 1154)  lactated ringers infusion ( Intravenous Rate/Dose Change 06/10/22 1032)   cefTRIAXone (ROCEPHIN) 2 g in sodium chloride 0.9 % 100 mL IVPB (has no administration in time range)  azithromycin (ZITHROMAX) 500 mg in sodium chloride 0.9 % 250 mL IVPB (has no administration in time range)  enoxaparin (LOVENOX) injection 40 mg (has no administration in time range)  acetaminophen (TYLENOL) tablet 650 mg (has no administration in time range)    Or  acetaminophen (TYLENOL) suppository 650 mg (has no administration in time range)  ondansetron (ZOFRAN) tablet 4 mg (has no administration in time range)    Or  ondansetron (ZOFRAN) injection 4 mg (has no administration in time range)  ferrous sulfate tablet 325 mg (325 mg Oral Given 06/10/22 0907)  pantoprazole (PROTONIX) EC tablet 40 mg (40 mg Oral Given 06/10/22 0758)  sucralfate (CARAFATE) tablet 1 g (1 g Oral Given 06/10/22 1153)  senna-docusate (Senokot-S) tablet 1 tablet (has no administration in time range)  thiamine (VITAMIN B1) injection 100 mg (0 mg Intramuscular Hold 06/10/22 1011)  chlordiazePOXIDE (LIBRIUM) capsule 25 mg (has no administration in time range)  hydrOXYzine (ATARAX) tablet 25 mg (has no administration in time range)  loperamide (IMODIUM) capsule 2-4 mg (has no administration in time range)  chlordiazePOXIDE (LIBRIUM) capsule 25 mg (25  mg Oral Given 06/10/22 1025)    Followed by  chlordiazePOXIDE (LIBRIUM) capsule 25 mg (has no administration in time range)    Followed by  chlordiazePOXIDE (LIBRIUM) capsule 25 mg (has no administration in time range)    Followed by  chlordiazePOXIDE (LIBRIUM) capsule 25 mg (has no administration in time range)  levalbuterol (XOPENEX) nebulizer solution 0.63 mg (0.63 mg Nebulization Given 06/10/22 1026)  ipratropium (ATROVENT) nebulizer solution 0.5 mg (0.5 mg Nebulization Given 06/10/22 1026)  guaiFENesin (MUCINEX) 12 hr tablet 1,200 mg (1,200 mg Oral Given 06/10/22 1025)  loratadine (CLARITIN) tablet 10 mg (10 mg Oral Given 06/10/22 1026)  fluticasone (FLONASE) 50 MCG/ACT  nasal spray 2 spray (2 sprays Each Nare Not Given 06/10/22 1111)  amLODipine (NORVASC) tablet 5 mg (5 mg Oral Given 06/10/22 1025)  erythromycin ophthalmic ointment 1 Application (has no administration in time range)  ofloxacin (OCUFLOX) 0.3 % ophthalmic solution 1 drop (1 drop Both Eyes Not Given 06/10/22 1112)  polyethylene glycol (MIRALAX / GLYCOLAX) packet 17 g (has no administration in time range)  nicotine (NICODERM CQ - dosed in mg/24 hours) patch 14 mg (14 mg Transdermal Patch Applied 06/10/22 1153)  morphine (PF) 2 MG/ML injection 2 mg (2 mg Intravenous Given 06/10/22 1038)  senna-docusate (Senokot-S) tablet 1 tablet (1 tablet Oral Given 06/10/22 1153)  sodium chloride 0.9 % bolus 500 mL (0 mLs Intravenous Stopped 06/09/22 2331)  morphine (PF) 4 MG/ML injection 4 mg (4 mg Intravenous Given 06/09/22 2330)  sodium chloride 0.9 % bolus 500 mL (0 mLs Intravenous Stopped 06/10/22 0248)  iohexol (OMNIPAQUE) 350 MG/ML injection 100 mL (100 mLs Intravenous Contrast Given 06/10/22 0215)  cefTRIAXone (ROCEPHIN) 2 g in sodium chloride 0.9 % 100 mL IVPB (0 g Intravenous Stopped 06/10/22 0422)  azithromycin (ZITHROMAX) 500 mg in sodium chloride 0.9 % 250 mL IVPB (0 mg Intravenous Stopped 06/10/22 0524)  lactated ringers bolus 1,000 mL (0 mLs Intravenous Stopped 06/10/22 0524)  potassium chloride SA (KLOR-CON M) CR tablet 40 mEq (40 mEq Oral Given 06/10/22 0625)  magnesium sulfate IVPB 4 g 100 mL (0 g Intravenous Stopped 06/10/22 0929)  sodium chloride 0.9 % bolus 1,000 mL (1,000 mLs Intravenous New Bag/Given 06/10/22 1010)    Mobility walks with device     Focused Assessments Pulmonary Assessment Handoff:  Lung sounds:   O2 Device: Nasal Cannula O2 Flow Rate (L/min): 2 L/min    R Recommendations: See Admitting Provider Note  Report given to:   Additional Notes: .

## 2022-06-10 NOTE — H&P (Signed)
History and Physical    Patient: Samuel Blevins RUE:[id number] DOB: 06/10/1965 DOA: 06/09/2022 DOS: the patient was seen and examined on 06/10/2022 PCP: [name], MD  Patient coming from: Home  Chief Complaint:  Chief Complaint  Patient presents with   Chest Pain   Shortness of Breath   Abdominal Pain   HPI: Samuel Blevins is a 57 y.o. male with medical history significant of EtOH abuse, homelessness, chronic abd pain, chronic pancreatitis.  Pt presents to ED with c/o CP, back pain, and abd pain.  Pt states this onset around 3pm, constant.  Associated with nausea but no vomiting nor diarrhea.  Intermittent SOB with cough.  No LE edema.   Review of Systems: As mentioned in the history of present illness. All other systems reviewed and are negative. Past Medical History:  Diagnosis Date   Alcohol withdrawal syndrome with complication    Alcoholism    Elevated AST (SGOT)    Gastrointestinal hemorrhage    Homeless    Hypertension    Pancreatic insufficiency    takes Creon   Symptomatic anemia 11/23/2015   Thrombocytopenia 05/09/2021   Past Surgical History:  Procedure Laterality Date   BIOPSY  06/23/2019   Procedure: BIOPSY;  Surgeon: Shellia Cleverly, DO;  Location: [facility] ENDOSCOPY;  Service: Gastroenterology;;   BIOPSY  12/12/2020   Procedure: BIOPSY;  Surgeon: Lynann Bologna, MD;  Location: [facility] ENDOSCOPY;  Service: Endoscopy;;   BIOPSY  08/04/2021   Procedure: BIOPSY;  Surgeon: Jenel Lucks, MD;  Location: [facility] ENDOSCOPY;  Service: Gastroenterology;;   ESOPHAGOGASTRODUODENOSCOPY N/A 08/04/2021   Procedure: ESOPHAGOGASTRODUODENOSCOPY (EGD);  Surgeon: Jenel Lucks, MD;  Location: [facility] ENDOSCOPY;  Service: Gastroenterology;  Laterality: N/A;   ESOPHAGOGASTRODUODENOSCOPY (EGD) WITH PROPOFOL N/A 06/23/2019   Procedure: ESOPHAGOGASTRODUODENOSCOPY (EGD) WITH PROPOFOL;  Surgeon: Shellia Cleverly, DO;  Location: [facility] ENDOSCOPY;  Service: Gastroenterology;   Laterality: N/A;   ESOPHAGOGASTRODUODENOSCOPY (EGD) WITH PROPOFOL N/A 12/12/2020   Procedure: ESOPHAGOGASTRODUODENOSCOPY (EGD) WITH PROPOFOL;  Surgeon: Lynann Bologna, MD;  Location: [facility] ENDOSCOPY;  Service: Endoscopy;  Laterality: N/A;   HOT HEMOSTASIS N/A 06/23/2019   Procedure: HOT HEMOSTASIS (ARGON PLASMA COAGULATION/BICAP);  Surgeon: Shellia Cleverly, DO;  Location: [facility] ENDOSCOPY;  Service: Gastroenterology;  Laterality: N/A;   Social History:  reports that he has been smoking cigarettes. He has a 15.00 pack-year smoking history. He has never used smokeless tobacco. He reports current alcohol use of about 28.0 standard drinks of alcohol per week. He reports that he does not use drugs.  No Known Allergies  Family History  Problem Relation Age of Onset   Diabetes Mellitus II Neg Hx    Colon cancer Neg Hx    Stomach cancer Neg Hx    Pancreatic cancer Neg Hx     Prior to Admission medications   Medication Sig Start Date End Date Taking? Authorizing Provider  erythromycin ophthalmic ointment Place 1 Application into both eyes at bedtime. 06/05/22  Yes [name], MD  acetaminophen (TYLENOL) 325 MG tablet Take 2 tablets (650 mg total) by mouth every 6 (six) hours as needed for fever, moderate pain, mild pain or headache. 12/12/20   Katsadouros, Vasilios, MD  amLODipine (NORVASC) 5 MG tablet Take 1 tablet (5 mg total) by mouth daily. 04/18/22   Lyndle Herrlich, MD  clobetasol cream (TEMOVATE) 0.05 % Apply 1 Application topically 2 (two) times daily. 03/06/22   [name], MD  folic acid (FOLVITE) 1 MG tablet Take 1 tablet (1 mg total)  by mouth daily. 10/17/21   [name], MD  Iron, Ferrous Sulfate, 325 (65 Fe) MG TABS Take 1 tablet ( 325 mg) by mouth daily. 05/24/22   [name], MD  lipase/protease/amylase (CREON) 12000-38000 units CPEP capsule Take 1 capsule (12,000 Units total) by mouth 3 (three) times daily before meals. 04/18/22   Lyndle Herrlich, MD   Multiple Vitamin (MULTIVITAMIN WITH MINERALS) TABS tablet Take 1 tablet by mouth daily. 09/27/21   [name], MD  ofloxacin (OCUFLOX) 0.3 % ophthalmic solution Place 1 drop into both eyes 4 (four) times daily. 03/20/22   [name], MD  ondansetron (ZOFRAN) 8 MG tablet Take 1 tablet (8 mg total) by mouth every 6 (six) hours as needed for nausea. 01/23/22   [name], MD  pantoprazole (PROTONIX) 40 MG tablet Take 1 tablet (40 mg total) by mouth 2 (two) times daily before a meal. 04/18/22   Lyndle Herrlich, MD  polyethylene glycol (MIRALAX / GLYCOLAX) 17 g packet Take 17 g by mouth daily as needed for mild constipation or moderate constipation. 10/07/21   Rocky Morel, DO  senna-docusate (SENOKOT-S) 8.6-50 MG tablet Take 1 tablet by mouth at bedtime as needed for mild constipation. 10/07/21   Rocky Morel, DO  sucralfate (CARAFATE) 1 g tablet Take 1 tablet (1 g total) by mouth 4 (four) times daily -  with meals and at bedtime. 12/27/21   [name], MD  thiamine (VITAMIN B1) 100 MG tablet Take 1 tablet (100 mg total) by mouth daily. 10/02/21   [name], MD    Physical Exam: Vitals:   06/10/22 0156 06/10/22 0230 06/10/22 0300 06/10/22 0400  BP:  128/71 124/66 136/76  Pulse:  (!) 125 (!) 125 (!) 118  Resp:  (!) 21 20 18   Temp: (!) 100.8 F (38.2 C)   99.1 F (37.3 C)  TempSrc: Oral     SpO2:  95% 96% 96%  Weight:      Height:       Constitutional: NAD, calm, comfortable Respiratory: Rales in BLL fields Cardiovascular: Tachycardia present Abdomen: Diffuse TTP Neurologic: CN 2-12 grossly intact. Sensation intact, DTR normal. Strength 5/5 in all 4.  Psychiatric: Normal judgment and insight. Alert and oriented x 3. Normal mood.   Data Reviewed:       Latest Ref Rng & Units 06/09/2022    8:26 PM 06/06/2022    1:55 PM 05/22/2022    1:33 PM  CBC  WBC 4.0 - 10.5 K/uL 10.9  4.1  4.3   Hemoglobin 13.0 - 17.0 g/dL 10.0  71.2  19.7   Hematocrit 39.0  - 52.0 % 34.7  31.5  35.8   Platelets 150 - 400 K/uL 123  109  316       Latest Ref Rng & Units 06/09/2022    8:26 PM 06/07/2022    6:00 AM 06/06/2022    1:55 PM  CMP  Glucose 70 - 99 mg/dL 93   588   BUN 6 - 20 mg/dL 13   5   Creatinine 3.25 - 1.24 mg/dL 4.98   2.64   Sodium 158 - 145 mmol/L 136   138   Potassium 3.5 - 5.1 mmol/L 3.2   3.0   Chloride 98 - 111 mmol/L 99   99   CO2 22 - 32 mmol/L 22   24   Calcium 8.9 - 10.3 mg/dL 8.7   8.6   Total Protein 6.5 - 8.1 g/dL  8.8  8.5    Total Bilirubin 0.3 - 1.2 mg/dL 1.0  0.9    Alkaline Phos 38 - 126 U/L 117  111    AST 15 - 41 U/L 102  109    ALT 0 - 44 U/L 32  33     CTA CAP: IMPRESSION: No thoracoabdominal aortic aneurysm or dissection.   Multifocal pneumonia, right middle and lower lobe predominant, new from the recent prior CT abdomen/pelvis.   Improving groove pancreatitis.   Moderate hepatic steatosis.  Lipase 62  Assessment and Plan: * CAP (community acquired pneumonia) Fever, mild WBC elevation.  Likely due to PNA as demonstrated on CT scan. PNA pathway Rocephin and azithromycin IVF Trend WBC, repeat CBC in AM  AKI (acute kidney injury) Probably pre-renal / ATN in setting of PNA IVF Strict intake and output No evidence of obstruction on imaging studies Repeat CMP in AM Replace K Check Mg  Chronic alcohol abuse CIWA High risk for withdrawal which he has had during multiple admits in past. Cont PPI and carafate for chronic EtOH gastritis  Pancreatic insufficiency Cont pancreatic enzymes with meals.  Chronic pain syndrome Pt with chronic abdominal pain due to EtOH gastritis and chronic pancreatitis. Not really a good candidate for opiates with ongoing EtOH abuse. Tylenol PRN for now      Advance Care Planning:   Code Status: Full Code  Consults: None  Family Communication: None at bedside.  Severity of Illness: The appropriate patient status for this patient is OBSERVATION. Observation status  is judged to be reasonable and necessary in order to provide the required intensity of service to ensure the patient's safety. The patient's presenting symptoms, physical exam findings, and initial radiographic and laboratory data in the context of their medical condition is felt to place them at decreased risk for further clinical deterioration. Furthermore, it is anticipated that the patient will be medically stable for discharge from the hospital within 2 midnights of admission.   Author: Hillary Bow., DO 06/10/2022 5:15 AM  For on call review [email or web address].

## 2022-06-10 NOTE — Assessment & Plan Note (Addendum)
Pt with chronic abdominal pain due to EtOH gastritis and chronic pancreatitis. -Patient with acute on chronic pain greater in the epigastrium and lower abdominal region. -Check a lipase level. -Check a bladder scan to r/o urinary retention. - 1 L NS bolus. -Increase IVF to NS 150cc/hr. -IV Morphine prn.

## 2022-06-10 NOTE — Assessment & Plan Note (Signed)
Magnesium at 1.4 on presentation. Magnesium sulfate 4 g IV x 1. Repeat labs in the AM.

## 2022-06-10 NOTE — Assessment & Plan Note (Addendum)
CIWA High risk for withdrawal which he has had during multiple admits in past. Cont PPI and carafate for chronic EtOH gastritis -Added Librium detox protocol. -See alcohol withdrawal above.

## 2022-06-11 LAB — LEGIONELLA PNEUMOPHILA SEROGP 1 UR AG: L. pneumophila Serogp 1 Ur Ag: NEGATIVE

## 2022-06-11 LAB — CBC
HCT: 29.5 % — ABNORMAL LOW (ref 39.0–52.0)
Hemoglobin: 9.8 g/dL — ABNORMAL LOW (ref 13.0–17.0)
MCH: 31.3 pg (ref 26.0–34.0)
MCHC: 33.2 g/dL (ref 30.0–36.0)
MCV: 94.2 fL (ref 80.0–100.0)
Platelets: 111 10*3/uL — ABNORMAL LOW (ref 150–400)
RBC: 3.13 MIL/uL — ABNORMAL LOW (ref 4.22–5.81)
RDW: 16.3 % — ABNORMAL HIGH (ref 11.5–15.5)
WBC: 11.3 10*3/uL — ABNORMAL HIGH (ref 4.0–10.5)
nRBC: 0 % (ref 0.0–0.2)

## 2022-06-11 LAB — URINE CULTURE: Culture: NO GROWTH

## 2022-06-11 LAB — COMPREHENSIVE METABOLIC PANEL
ALT: 30 U/L (ref 0–44)
AST: 87 U/L — ABNORMAL HIGH (ref 15–41)
Albumin: 2.6 g/dL — ABNORMAL LOW (ref 3.5–5.0)
Alkaline Phosphatase: 81 U/L (ref 38–126)
Anion gap: 12 (ref 5–15)
BUN: 5 mg/dL — ABNORMAL LOW (ref 6–20)
CO2: 24 mmol/L (ref 22–32)
Calcium: 8.3 mg/dL — ABNORMAL LOW (ref 8.9–10.3)
Chloride: 98 mmol/L (ref 98–111)
Creatinine, Ser: 0.59 mg/dL — ABNORMAL LOW (ref 0.61–1.24)
GFR, Estimated: >60 mL/min (ref >60)
Glucose, Bld: 82 mg/dL (ref 70–99)
Potassium: 3.4 mmol/L — ABNORMAL LOW (ref 3.5–5.1)
Sodium: 134 mmol/L — ABNORMAL LOW (ref 135–145)
Total Bilirubin: 1.4 mg/dL — ABNORMAL HIGH (ref 0.3–1.2)
Total Protein: 7 g/dL (ref 6.5–8.1)

## 2022-06-11 LAB — GLUCOSE, CAPILLARY
Glucose-Capillary: 108 mg/dL — ABNORMAL HIGH (ref 70–99)
Glucose-Capillary: 76 mg/dL (ref 70–99)
Glucose-Capillary: 80 mg/dL (ref 70–99)
Glucose-Capillary: 85 mg/dL (ref 70–99)
Glucose-Capillary: 86 mg/dL (ref 70–99)
Glucose-Capillary: 92 mg/dL (ref 70–99)

## 2022-06-11 LAB — MAGNESIUM: Magnesium: 1.9 mg/dL (ref 1.7–2.4)

## 2022-06-11 LAB — CULTURE, BLOOD (ROUTINE X 2)

## 2022-06-11 MED ORDER — POTASSIUM CHLORIDE CRYS ER 20 MEQ PO TBCR
40.0000 meq | EXTENDED_RELEASE_TABLET | Freq: Once | ORAL | Status: AC
  Administered 2022-06-11: 40 meq via ORAL
  Filled 2022-06-11: qty 2

## 2022-06-11 MED ORDER — LABETALOL HCL 5 MG/ML IV SOLN: 20.0000 mg | INTRAVENOUS | Status: DC | PRN

## 2022-06-11 MED ORDER — SODIUM CHLORIDE 0.9 % IV SOLN
260.0000 mg | Freq: Once | INTRAVENOUS | Status: AC
  Administered 2022-06-11: 260 mg via INTRAVENOUS
  Filled 2022-06-11: qty 2

## 2022-06-11 MED ORDER — PHENOBARBITAL 32.4 MG PO TABS
97.2000 mg | ORAL_TABLET | Freq: Three times a day (TID) | ORAL | Status: DC
  Administered 2022-06-11 – 2022-06-12 (×3): 97.2 mg via ORAL
  Filled 2022-06-11 (×3): qty 3

## 2022-06-11 MED ORDER — DEXMEDETOMIDINE HCL IN NACL 400 MCG/100ML IV SOLN
0.2000 ug/kg/h | INTRAVENOUS | Status: DC
  Administered 2022-06-11: 0.7 ug/kg/h via INTRAVENOUS
  Administered 2022-06-11: 0.5 ug/kg/h via INTRAVENOUS
  Filled 2022-06-11 (×2): qty 100

## 2022-06-11 MED ORDER — PHENOBARBITAL 32.4 MG PO TABS: 64.8000 mg | ORAL_TABLET | Freq: Three times a day (TID) | ORAL | Status: DC

## 2022-06-11 MED ORDER — PHENOBARBITAL 32.4 MG PO TABS: 32.4000 mg | ORAL_TABLET | Freq: Three times a day (TID) | ORAL | Status: DC

## 2022-06-11 NOTE — TOC Initial Note (Signed)
Transition of Care [facility]) - Initial/Assessment Note    Patient Details  Name: Samuel Blevins MRN: [id number] Date of Birth: Jun 25, 1965  [facility] CM/SW Contact:    Harriet Masson, RN Phone Number: 06/11/2022, 9:37 AM  Clinical Narrative:                     Patient recently discharged in 2/204 to the  [facility].  MATCH letter was used for discharge prescriptions. Outpatient therapy was arranged through [facility].  Patient admitted to ICU for CAP and ETOH withdrawal.   TOC following.    Patient Goals and CMS Choice            Expected Discharge Plan and Services                                              Prior Living Arrangements/Services                       Activities of Daily Living Home Assistive Devices/Equipment: Environmental consultant (specify type), Cane (specify quad or straight) (straight cane and rolling walker) ADL Screening (condition at time of admission) Patient's cognitive ability adequate to safely complete daily activities?: Yes Is the patient deaf or have difficulty hearing?: No Does the patient have difficulty seeing, even when wearing glasses/contacts?: No Does the patient have difficulty concentrating, remembering, or making decisions?: No Patient able to express need for assistance with ADLs?: Yes Does the patient have difficulty dressing or bathing?: No Independently performs ADLs?: Yes (appropriate for developmental age) Does the patient have difficulty walking or climbing stairs?: Yes Weakness of Legs: Both Weakness of Arms/Hands: Both  Permission Sought/Granted                  Emotional Assessment              Admission diagnosis:  Alcohol withdrawal [F10.939] Hypokalemia [E87.6] Thrombocytopenia [D69.6] CAP (community acquired pneumonia) [J18.9] Acute kidney injury (nontraumatic) [N17.9] Normochromic normocytic anemia [D64.9] Elevated AST (SGOT) [R74.01] Community  acquired pneumonia of right lung, unspecified part of lung [J18.9] Patient Active Problem List   Diagnosis Date Noted   CAP (community acquired pneumonia) 06/10/2022   AKI (acute kidney injury) 06/10/2022   Encephalopathy acute 06/10/2022   Sepsis with acute organ dysfunction and septic shock 06/10/2022   Acute on chronic pancreatitis 06/10/2022   Acute kidney injury (nontraumatic) 06/10/2022   Fall 04/16/2022   Alcohol withdrawal with inpatient treatment with perceptual disturbance 04/13/2022   Cervical spine fracture 01/31/2022   Pancytopenia 01/31/2022   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   Hypokalemia    Hypomagnesemia    Portal vein thrombosis 08/03/2021   Alcoholic pancreatitis 08/03/2021   Alcohol-induced chronic pancreatitis 08/02/2021   Anxiety and depression 07/04/2021   Homeless 07/04/2021   Osteoarthritis 07/04/2021   Thrombocytopenia 05/09/2021   Pancreatic insufficiency 05/08/2021   Chronic alcohol abuse 05/07/2021   Alcoholic steatohepatitis 12/07/2020   Iron deficiency anemia due to chronic blood loss 12/07/2020   Seizure 12/04/2020   Acute pancreatitis 09/16/2020   Alcohol withdrawal 09/16/2020   Chronic pain syndrome 12/28/2019   Pancreatic pseudocyst 10/28/2019   Gastritis and gastroduodenitis    AVM (arteriovenous malformation) of small bowel, acquired    Transaminitis    Alcohol dependence syndrome 06/21/2019   Chronic anemia 11/23/2015  Tobacco abuse 03/17/2014   Essential hypertension 03/17/2014   PCP:  [name], MD Pharmacy:   [facility] [address] [place] - [address] [place] [address] Phone: [phone] Fax: [phone]  [place] [facility] [address] [place] [address] Phone: [phone] Fax: [phone]  [facility] [address] [place]  [place] [address] Phone: [phone] Fax: [phone]  [facility] [address] [place] [address] Phone: [phone] Fax: [phone]     Social Determinants of Health (SDOH) Social History: SDOH Screenings   Food Insecurity: Food Insecurity Present (06/10/2022)  Housing: Medium Risk (06/10/2022)  Transportation Needs: Patient Declined (06/10/2022)  Utilities: Patient Declined (06/10/2022)  Depression (PHQ2-9): High Risk (02/14/2022)  Tobacco Use: High Risk (06/10/2022)   SDOH Interventions:     Readmission Risk Interventions     No data to display

## 2022-06-11 NOTE — Evaluation (Signed)
Physical Therapy Evaluation Patient Details Name: Samuel Blevins MRN: [id number] DOB: [birth year] Today's Date: 06/11/2022  History of Present Illness  Samuel Blevins is a 57 y.o. M who presented with chest/abdominal pain and shortness of breath from [employer]. He was found to have CAP and pancreatitis. Repeat chest CT showed  multi-focal pneumonia. PMHx: ETOH abuse, HTN, thrombocytopenia, unhoused  Clinical Impression   Pt presents with generalized weakness, impaired balance, impaired gait, decreased safety awareness, and decreased activity tolerance. Pt to benefit from acute PT to address deficits. Pt ambulated room distance with rollator and light assist +2 safety, pt with difficulty managing rollator and maintaining balance without min support. PT anticipates pt will progress well acutely, do not anticipate follow up needs. PT to progress mobility as tolerated, and will continue to follow acutely.         Recommendations for follow up therapy are one component of a multi-disciplinary discharge planning process, led by the attending physician.  Recommendations may be updated based on patient status, additional functional criteria and insurance authorization.  Follow Up Recommendations       Assistance Recommended at Discharge PRN  Patient can return home with the following  A little help with walking and/or transfers;A little help with bathing/dressing/bathroom    Equipment Recommendations None recommended by PT  Recommendations for Other Services       Functional Status Assessment Patient has had a recent decline in their functional status and demonstrates the ability to make significant improvements in function in a reasonable and predictable amount of time.     Precautions / Restrictions Precautions Precautions: Fall Restrictions Weight Bearing Restrictions: No      Mobility  Bed Mobility Overal bed mobility: Needs Assistance Bed Mobility: Supine to Sit      Supine to sit: Min assist, HOB elevated, +2 for safety/equipment     General bed mobility comments: assist for trunk elevation, scooting to EOB, +2 lines/leads.    Transfers Overall transfer level: Needs assistance Equipment used: Rollator (4 wheels) Transfers: Sit to/from Stand Sit to Stand: Min assist, From elevated surface, +2 safety/equipment           General transfer comment: light rise and steady assist, cues for correct hand placement.    Ambulation/Gait Ambulation/Gait assistance: Min assist, +2 safety/equipment Gait Distance (Feet): 10 Feet (+5) Assistive device: Rollator (4 wheels) Gait Pattern/deviations: Step-through pattern, Decreased stride length, Trunk flexed Gait velocity: decr     General Gait Details: cues for upright posture, steadying and rollator steering assist. pt's LUE sliding off rollator on L several times with little awareness  Stairs            Wheelchair Mobility    Modified Rankin (Stroke Patients Only)       Balance Overall balance assessment: Needs assistance Sitting-balance support: No upper extremity supported, Feet supported Sitting balance-Leahy Scale: Fair     Standing balance support: Bilateral upper extremity supported, During functional activity Standing balance-Leahy Scale: Poor                               Pertinent Vitals/Pain Pain Assessment Pain Assessment: Faces Faces Pain Scale: No hurt Pain Intervention(s): Monitored during session    Home Living Family/patient expects to be discharged to:: Shelter/Homeless                        Prior Function Prior Level of Function : Independent/Modified Independent  Mobility Comments: ambulatory with rollator PTA ADLs Comments: [facility], using briefs as needed     Hand Dominance   Dominant Hand: Right    Extremity/Trunk Assessment   Upper Extremity Assessment Upper Extremity  Assessment: Defer to OT evaluation    Lower Extremity Assessment Lower Extremity Assessment: Generalized weakness    Cervical / Trunk Assessment Cervical / Trunk Assessment: Normal  Communication   Communication: Other (comment)  Cognition Arousal/Alertness: Awake/alert Behavior During Therapy: Restless, Impulsive Overall Cognitive Status: Impaired/Different from baseline Area of Impairment: Orientation, Following commands, Safety/judgement, Problem solving                 Orientation Level: Disoriented to, Situation     Following Commands: Follows one step commands with increased time Safety/Judgement: Decreased awareness of deficits, Decreased awareness of safety   Problem Solving: Slow processing, Difficulty sequencing, Requires verbal cues, Requires tactile cues, Decreased initiation General Comments: pt states he is in the hospital because "I had a stroke". Pt is aware he is w/d from ETOH, can be impulsive and requires cues to wait for assist        General Comments General comments (skin integrity, edema, etc.): SpO2 86-87% on RA during gait, recovered to 90% and greater with standing or seated rest    Exercises     Assessment/Plan    PT Assessment Patient needs continued PT services  PT Problem List Decreased strength;Decreased mobility;Decreased activity tolerance;Decreased balance;Decreased knowledge of use of DME;Pain;Cardiopulmonary status limiting activity;Decreased safety awareness       PT Treatment Interventions DME instruction;Therapeutic activities;Gait training;Therapeutic exercise;Patient/family education;Balance training;Functional mobility training;Neuromuscular re-education    PT Goals (Current goals can be found in the Care Plan section)  Acute Rehab PT Goals PT Goal Formulation: With patient Time For Goal Achievement: 06/25/22 Potential to Achieve Goals: Good    Frequency Min 3X/week     Co-evaluation   Reason for Co-Treatment:  Complexity of the patient's impairments (multi-system involvement);To address functional/ADL transfers;Necessary to address cognition/behavior during functional activity;For patient/therapist safety   OT goals addressed during session: ADL's and self-care       AM-PAC PT "6 Clicks" Mobility  Outcome Measure Help needed turning from your back to your side while in a flat bed without using bedrails?: A Little Help needed moving from lying on your back to sitting on the side of a flat bed without using bedrails?: A Little Help needed moving to and from a bed to a chair (including a wheelchair)?: A Little Help needed standing up from a chair using your arms (e.g., wheelchair or bedside chair)?: A Little Help needed to walk in hospital room?: A Little Help needed climbing 3-5 steps with a railing? : A Lot 6 Click Score: 17    End of Session   Activity Tolerance: Patient tolerated treatment well Patient left: in bed;with call bell/phone within reach;with bed alarm set Nurse Communication: Mobility status PT Visit Diagnosis: Other abnormalities of gait and mobility (R26.89);Muscle weakness (generalized) (M62.81)    Time: 1308-6578 PT Time Calculation (min) (ACUTE ONLY): 25 min   Charges:   PT Evaluation $PT Eval Low Complexity: 1 Low          Kristain Hu S, PT DPT Acute Rehabilitation Services Pager [phone]  Office [phone]   Tyrone Apple E Christain [place] 06/11/2022, 2:21 PM

## 2022-06-11 NOTE — Evaluation (Signed)
Occupational Therapy Evaluation Patient Details Name: Samuel Blevins MRN: [id number] DOB: [birth year] Today's Date: 06/11/2022   History of Present Illness Samuel Blevins is a 57 y.o. M who presented with chest/abdominal pain and shortness of breath from [employer]. He was found to have CAP and pancreatitis. Repeat chest CT showed  multi-focal pneumonia. PMHx: ETOH abuse, HTN, thrombocytopenia, unhoused   Clinical Impression   Samuel Blevins was evaluated s/p the above admission list. He is unhoused and mod I at baseline. Upon evaluation he was limited by impulsivity, poor safety awareness, impaired cognition, generalized weakness, decreased activity tolerance and unsteady balance. Overall he needed min A for all aspects of mobility with cues for safety and rollator management. Due to the deficits listed below he also needed set up A for UB ADLs and up to max A for LB ADLs. Pt will benefit from continued acute OT services to facilitate safe d/c back to the street.        Recommendations for follow up therapy are one component of a multi-disciplinary discharge planning process, led by the attending physician.  Recommendations may be updated based on patient status, additional functional criteria and insurance authorization.      Patient can return home with the following Assist for transportation;Assistance with cooking/housework;Direct supervision/assist for medications management;Direct supervision/assist for financial management    Functional Status Assessment  Patient has had a recent decline in their functional status and demonstrates the ability to make significant improvements in function in a reasonable and predictable amount of time.  Equipment Recommendations  None recommended by OT       Precautions / Restrictions Precautions Precautions: Fall Restrictions Weight Bearing Restrictions: No      Mobility Bed Mobility Overal bed mobility: Needs Assistance Bed Mobility: Supine to  Sit, Sit to Supine     Supine to sit: Min assist, HOB elevated, +2 for safety/equipment Sit to supine: Min assist        Transfers Overall transfer level: Needs assistance Equipment used: Rollator (4 wheels) Transfers: Sit to/from Stand Sit to Stand: Min assist, From elevated surface, +2 safety/equipment                  Balance Overall balance assessment: Needs assistance Sitting-balance support: No upper extremity supported, Feet supported Sitting balance-Leahy Scale: Fair     Standing balance support: Bilateral upper extremity supported, During functional activity Standing balance-Leahy Scale: Poor       ADL either performed or assessed with clinical judgement   ADL Overall ADL's : Needs assistance/impaired Eating/Feeding: Set up;Sitting   Grooming: Set up;Sitting   Upper Body Bathing: Minimal assistance;Sitting   Lower Body Bathing: Sit to/from stand;Moderate assistance   Upper Body Dressing : Set up;Sitting   Lower Body Dressing: Moderate assistance;Sit to/from stand   Toilet Transfer: Minimal assistance;+2 for safety/equipment;Ambulation;BSC/3in1;Rolling walker (2 wheels)   Toileting- Clothing Manipulation and Hygiene: Maximal assistance;Sit to/from stand       Functional mobility during ADLs: Minimal assistance;+2 for safety/equipment General ADL Comments: limited by impiared cognition, weakness, poor balance     Vision Baseline Vision/History: 0 No visual deficits Vision Assessment?: No apparent visual deficits Additional Comments: not formally assessed     Perception Perception Perception Tested?: No   Praxis Praxis Praxis tested?: Not tested    Pertinent Vitals/Pain Pain Assessment Pain Assessment: Faces Faces Pain Scale: No hurt Pain Location: abdomen Pain Descriptors / Indicators: Grimacing Pain Intervention(s): Limited activity within patient's tolerance, Monitored during session     Hand Dominance Right  Extremity/Trunk  Assessment Upper Extremity Assessment Upper Extremity Assessment: LUE deficits/detail LUE Deficits / Details: poor proprioception of LUE, required cues to maintain grasp on walker LUE Sensation: decreased proprioception LUE Coordination: decreased fine motor;decreased gross motor   Lower Extremity Assessment Lower Extremity Assessment: Defer to PT evaluation   Cervical / Trunk Assessment Cervical / Trunk Assessment: Normal   Communication Communication Communication: Other (comment)   Cognition Arousal/Alertness: Awake/alert Behavior During Therapy: Restless, Impulsive Overall Cognitive Status: Impaired/Different from baseline Area of Impairment: Orientation, Following commands, Safety/judgement, Problem solving                 Orientation Level: Disoriented to, Situation     Following Commands: Follows one step commands with increased time Safety/Judgement: Decreased awareness of deficits, Decreased awareness of safety   Problem Solving: Slow processing, Difficulty sequencing, Requires verbal cues, Requires tactile cues, Decreased initiation General Comments: pt states he is in the hospital because "I had a stroke". Pt is aware he is w/d from ETOH, can be impulsive and requires cues to wait for assist     General Comments  SpO2 86-87% on RA during gait, recovered to 90% and greater with standing or seated rest            Home Living Family/patient expects to be discharged to:: Shelter/Homeless                Prior Functioning/Environment Prior Level of Function : Independent/Modified Independent             Mobility Comments: ambulatory with rollator PTA ADLs Comments: [facility], using briefs as needed        OT Problem List: Decreased range of motion;Decreased activity tolerance;Impaired balance (sitting and/or standing);Decreased cognition;Decreased safety awareness;Decreased knowledge of use of DME or AE;Decreased  knowledge of precautions      OT Treatment/Interventions: Self-care/ADL training;Therapeutic exercise;DME and/or AE instruction;Therapeutic activities;Balance training;Patient/family education    OT Goals(Current goals can be found in the care plan section) Acute Rehab OT Goals Patient Stated Goal: to feel better OT Goal Formulation: With patient Time For Goal Achievement: 06/25/22 Potential to Achieve Goals: Good ADL Goals Pt Will Perform Lower Body Dressing: with modified independence;sit to/from stand Pt Will Transfer to Toilet: with modified independence;ambulating Pt Will Perform Toileting - Clothing Manipulation and hygiene: with modified independence;sit to/from stand Additional ADL Goal #1: Pt will indep complete IADL medication management task  OT Frequency: Min 2X/week    Co-evaluation PT/OT/SLP Co-Evaluation/Treatment: Yes Reason for Co-Treatment: Complexity of the patient's impairments (multi-system involvement);To address functional/ADL transfers;Necessary to address cognition/behavior during functional activity;For patient/therapist safety   OT goals addressed during session: ADL's and self-care      AM-PAC OT "6 Clicks" Daily Activity     Outcome Measure Help from another person eating meals?: A Little Help from another person taking care of personal grooming?: A Little Help from another person toileting, which includes using toliet, bedpan, or urinal?: A Lot Help from another person bathing (including washing, rinsing, drying)?: A Lot Help from another person to put on and taking off regular upper body clothing?: A Little Help from another person to put on and taking off regular lower body clothing?: A Lot 6 Click Score: 15   End of Session Equipment Utilized During Treatment: Gait belt;Rollator (4 wheels) Nurse Communication: Mobility status  Activity Tolerance: Patient tolerated treatment well Patient left: in bed;with call bell/phone within reach;with bed  alarm set  OT Visit Diagnosis: Unsteadiness on feet (  R26.81);Other abnormalities of gait and mobility (R26.89);Muscle weakness (generalized) (M62.81)                Time: 1610-9604 OT Time Calculation (min): 24 min Charges:  OT General Charges $OT Visit: 1 Visit OT Evaluation $OT Eval Moderate Complexity: 1 Mod  Derenda Mis, OTR/L Acute Rehabilitation Services Office [phone] Secure Chat Communication Preferred   Donia Pounds 06/11/2022, 2:29 PM

## 2022-06-11 NOTE — Progress Notes (Signed)
NAME:  Samuel Blevins, MRN:  [id number], DOB:  [birth year], LOS: 1 ADMISSION DATE:  06/09/2022, CONSULTATION DATE:  06/10/22 REFERRING MD:  Janee Morn, CHIEF COMPLAINT:  ETOH Withdrawal    History of Present Illness:  Samuel Blevins is a 57 y.o. M with PMH significant for ETOH abuse, HTN, thrombocytopenia who presented to the ED with chest and abdominal pain and shortness of breath from [employer] on 4/15.  He was found to have CAP and pancreatitis.  Troponins were flat, ETOH 358, lipase 62, creatinine 1.4, lactic acid 2.1.  A CT abd/pelvis pelvis was obtained showing peripancreatic inflammation.  He was treated with 3L IVF, azithromycin and ceftriaxone and admitted to [name].   He required over 7mg  Ativan and Librium and remained restless and tachycardic, so PCCM consulted.  Repeat CT chest/abd/pelvis for dissection showed multi-focal pneumonia and was requiring 2L O2   Pertinent  Medical History  Past medical history of Alcohol withdrawal syndrome with complication, Alcoholism, Elevated AST (SGOT), Gastrointestinal hemorrhage, Homeless, Hypertension, Pancreatic insufficiency, Symptomatic anemia (11/23/2015), and Thrombocytopenia (05/09/2021).   Significant Hospital Events: Including procedures, antibiotic start and stop dates in addition to other pertinent events   4/15 admit for pancreatitis, CAP, ETOH withdrawal, PCCM consult   Interim History / Subjective:  Patient evaluated at bedside this morning.  Patient states he is having some abdominal pain.  He also reports seeing some people in the room that should not be there.  He denies any auditory hallucinations.  He reports having some tremors.  He states he drinks about 6 airplane bottles a day.  He has no other concerns this morning.  He does state that he thinks he had a seizure and that is why he is in the hospital.  Objective   Blood pressure (!) 143/102, pulse (!) 101, temperature 98.2 F (36.8 C), temperature source Oral, resp. rate (!)  24, height 6\' 1"  (1.854 m), weight 78.8 kg, SpO2 98 %.    FiO2 (%):  [28 %] 28 %   Intake/Output Summary (Last 24 hours) at 06/11/2022 0732 Last data filed at 06/11/2022 0600 Gross per 24 hour  Intake 3256.29 ml  Output 1675 ml  Net 1581.29 ml   Filed Weights   06/09/22 2013 06/11/22 0139 06/11/22 0500  Weight: 77.1 kg 78.8 kg 78.8 kg    Examination: General: Resting in bed, no acute distress HENT: Normocephalic, atraumatic Lungs: Clear to auscultation bilaterally Cardiovascular: Regular rate and rhythm, no murmurs, rubs, or gallops Abdomen: Soft, nontender, nondistended Extremities: Able to move extremities freely, no edema appreciated to bilateral lower extremities Neuro: Able to follow commands  Resolved Hospital Problem list     Assessment & Plan:  This is a 56 year old male who presented to the emergency department with concerns of abdominal pain and chest pain.  Patient found to have community-acquired pneumonia.  Patient developed hypertension and tachycardia, concern for alcohol withdrawal, and admitted for further evaluation workup.  #Acute hypoxic respiratory failure #CAP #Severe sepsis Patient evaluated bedside this morning.  Patient is still requiring 2 L nasal cannula.  Patient denies any shortness of breath.  On exam, patient does have clear lung sounds bilaterally.  Patient seems comfortable in bed.  Strep antigen negative.  Still awaiting Legionella antigen. -Continue to wean oxygen to keep SpO2 goal greater than 92% -Continue with ceftriaxone and azithromycin -Follow cultures -Continue to monitor respiratory status  #Alcohol use disorder #Alcohol withdrawal #Elevated liver enzymes Patient currently approaching his 48-hour window.  Patient is still requiring Ativan.  Recent CIWA scores between 7 and 8.  Patient states she drinks 6 airplane bottles a day.  He states he drinks every day.  On exam, patient does have tremors.  He also appreciates visual  hallucinations.  He did not seem anxious.  Patient remains tachycardic and hypertensive. -Will transition to phenobarb -Discontinue Librium taper -Continue to monitor CIWA scores -TOC consult for alcohol cessation resources -Continue with thiamine and folate  #Chronic pancreatitis Patient does appreciate some abdominal pain today.  Lipase is back down to normal.  No acute concerns at this time. -Plan continue to monitor -Plan to advance diet as tolerated   #Hyponatremia #Hypokalemia Patient sodium up to the 134.  Likely due to dehydration.  Potassium 3.4.  Will continue to replete potassium. -Continue to monitor BMP  Best Practice (right click and "Reselect all SmartList Selections" daily)   Diet/type: clear liquids DVT prophylaxis: SCD GI prophylaxis: PPI Lines: N/A Foley:  N/A Code Status:  full code  Labs   CBC: Recent Labs  Lab 06/06/22 1355 06/09/22 2026 06/10/22 1145 06/11/22 0215  WBC 4.1 10.9* 14.1* 11.3*  NEUTROABS  --   --  12.7*  --   HGB 10.8* 11.7* 10.1* 9.8*  HCT 31.5* 34.7* 29.7* 29.5*  MCV 92.1 93.3 92.5 94.2  PLT 109* 123* 105* 111*    Basic Metabolic Panel: Recent Labs  Lab 06/06/22 1355 06/09/22 2026 06/09/22 2138 06/10/22 1048 06/10/22 1632 06/11/22 0215  NA 138 136  --  129*  --  134*  K 3.0* 3.2*  --  3.5  --  3.4*  CL 99 99  --  96*  --  98  CO2 24 22  --  21*  --  24  GLUCOSE 123* 93  --  102*  --  82  BUN 5* 13  --  7  --  5*  CREATININE 0.52* 1.40*  --  0.75 0.71 0.59*  CALCIUM 8.6* 8.7*  --  8.0*  --  8.3*  MG  --   --  1.4*  --   --  1.9   GFR: Estimated Creatinine Clearance: 112.2 mL/min (A) (by C-G formula based on SCr of 0.59 mg/dL (L)). Recent Labs  Lab 06/06/22 1355 06/09/22 2026 06/10/22 0335 06/10/22 1043 06/10/22 1145 06/10/22 1757 06/11/22 0215  WBC 4.1 10.9*  --   --  14.1*  --  11.3*  LATICACIDVEN  --   --  2.1* 2.3*  --  1.9  --     Liver Function Tests: Recent Labs  Lab 06/07/22 0600 06/09/22 2026  06/10/22 1048 06/11/22 0215  AST 109* 102* 79* 87*  ALT 33 32 26 30  ALKPHOS 111 117 90 81  BILITOT 0.9 1.0 1.4* 1.4*  PROT 8.5* 8.8* 7.3 7.0  ALBUMIN 3.4* 3.6 2.9* 2.6*   Recent Labs  Lab 06/07/22 0600 06/10/22 1048  LIPASE 62* 28   No results for input(s): "AMMONIA" in the last 168 hours.  ABG    Component Value Date/Time   TCO2 23 08/26/2020 1141     Coagulation Profile: No results for input(s): "INR", "PROTIME" in the last 168 hours.  Cardiac Enzymes: No results for input(s): "CKTOTAL", "CKMB", "CKMBINDEX", "TROPONINI" in the last 168 hours.  HbA1C: Hgb A1c MFr Bld  Date/Time Value Ref Range Status  08/02/2021 12:47 PM 5.2 4.8 - 5.6 % Final    Comment:             Prediabetes: 5.7 - 6.4  Diabetes: >6.4          Glycemic control for adults with diabetes: <7.0     CBG: Recent Labs  Lab 06/10/22 1708 06/10/22 2017 06/10/22 2334 06/11/22 0347  GLUCAP 105* 95 81 86    Review of Systems:   Negative except for what is stated in HPI  Past Medical History:  He,  has a past medical history of Alcohol withdrawal syndrome with complication, Alcoholism, Elevated AST (SGOT), Gastrointestinal hemorrhage, Homeless, Hypertension, Pancreatic insufficiency, Symptomatic anemia (11/23/2015), and Thrombocytopenia (05/09/2021).   Surgical History:   Past Surgical History:  Procedure Laterality Date   BIOPSY  06/23/2019   Procedure: BIOPSY;  Surgeon: Shellia Cleverly, DO;  Location: [facility] ENDOSCOPY;  Service: Gastroenterology;;   BIOPSY  12/12/2020   Procedure: BIOPSY;  Surgeon: Lynann Bologna, MD;  Location: [facility] ENDOSCOPY;  Service: Endoscopy;;   BIOPSY  08/04/2021   Procedure: BIOPSY;  Surgeon: Jenel Lucks, MD;  Location: [facility] ENDOSCOPY;  Service: Gastroenterology;;   ESOPHAGOGASTRODUODENOSCOPY N/A 08/04/2021   Procedure: ESOPHAGOGASTRODUODENOSCOPY (EGD);  Surgeon: Jenel Lucks, MD;  Location: [facility] ENDOSCOPY;  Service: Gastroenterology;  Laterality: N/A;    ESOPHAGOGASTRODUODENOSCOPY (EGD) WITH PROPOFOL N/A 06/23/2019   Procedure: ESOPHAGOGASTRODUODENOSCOPY (EGD) WITH PROPOFOL;  Surgeon: Shellia Cleverly, DO;  Location: [facility] ENDOSCOPY;  Service: Gastroenterology;  Laterality: N/A;   ESOPHAGOGASTRODUODENOSCOPY (EGD) WITH PROPOFOL N/A 12/12/2020   Procedure: ESOPHAGOGASTRODUODENOSCOPY (EGD) WITH PROPOFOL;  Surgeon: Lynann Bologna, MD;  Location: [facility] ENDOSCOPY;  Service: Endoscopy;  Laterality: N/A;   HOT HEMOSTASIS N/A 06/23/2019   Procedure: HOT HEMOSTASIS (ARGON PLASMA COAGULATION/BICAP);  Surgeon: Shellia Cleverly, DO;  Location: [facility] ENDOSCOPY;  Service: Gastroenterology;  Laterality: N/A;     Social History:   reports that he has been smoking cigarettes. He has a 15.00 pack-year smoking history. He has never used smokeless tobacco. He reports current alcohol use of about 28.0 standard drinks of alcohol per week. He reports that he does not use drugs.   Family History:  His family history is negative for Diabetes Mellitus II, Colon cancer, Stomach cancer, and Pancreatic cancer.   Allergies No Known Allergies   Home Medications  Prior to Admission medications   Medication Sig Start Date End Date Taking? Authorizing Provider  erythromycin ophthalmic ointment Place 1 Application into both eyes at bedtime. 06/05/22  Yes [name], MD  acetaminophen (TYLENOL) 325 MG tablet Take 2 tablets (650 mg total) by mouth every 6 (six) hours as needed for fever, moderate pain, mild pain or headache. 12/12/20   Katsadouros, Vasilios, MD  amLODipine (NORVASC) 5 MG tablet Take 1 tablet (5 mg total) by mouth daily. 04/18/22   Lyndle Herrlich, MD  clobetasol cream (TEMOVATE) 0.05 % Apply 1 Application topically 2 (two) times daily. 03/06/22   [name], MD  folic acid (FOLVITE) 1 MG tablet Take 1 tablet (1 mg total) by mouth daily. 10/17/21   [name], MD  Iron, Ferrous Sulfate, 325 (65 Fe) MG TABS Take 1 tablet ( 325 mg) by mouth  daily. 05/24/22   [name], MD  lipase/protease/amylase (CREON) 12000-38000 units CPEP capsule Take 1 capsule (12,000 Units total) by mouth 3 (three) times daily before meals. 04/18/22   Lyndle Herrlich, MD  Multiple Vitamin (MULTIVITAMIN WITH MINERALS) TABS tablet Take 1 tablet by mouth daily. 09/27/21   [name], MD  ofloxacin (OCUFLOX) 0.3 % ophthalmic solution Place 1 drop into both eyes 4 (four) times daily. 03/20/22   [name], MD  ondansetron (ZOFRAN) 8 MG tablet Take 1 tablet (8 mg total) by mouth every 6 (six) hours as needed for nausea. 01/23/22   [name], MD  pantoprazole (PROTONIX) 40 MG tablet Take 1 tablet (40 mg total) by mouth 2 (two) times daily before a meal. 04/18/22   Lyndle Herrlich, MD  polyethylene glycol (MIRALAX / GLYCOLAX) 17 g packet Take 17 g by mouth daily as needed for mild constipation or moderate constipation. 10/07/21   Rocky Morel, DO  senna-docusate (SENOKOT-S) 8.6-50 MG tablet Take 1 tablet by mouth at bedtime as needed for mild constipation. 10/07/21   Rocky Morel, DO  sucralfate (CARAFATE) 1 g tablet Take 1 tablet (1 g total) by mouth 4 (four) times daily -  with meals and at bedtime. 12/27/21   [name], MD  thiamine (VITAMIN B1) 100 MG tablet Take 1 tablet (100 mg total) by mouth daily. 10/02/21   [name], MD     Critical care time: 40 minutes    Modena Slater, DO Internal Medicine Resident PGY-1 Pager: [phone]

## 2022-06-12 ENCOUNTER — Other Ambulatory Visit (HOSPITAL_COMMUNITY): Payer: Self-pay

## 2022-06-12 LAB — CBC
HCT: 31.6 % — ABNORMAL LOW (ref 39.0–52.0)
Hemoglobin: 10.9 g/dL — ABNORMAL LOW (ref 13.0–17.0)
MCH: 31.7 pg (ref 26.0–34.0)
MCHC: 34.5 g/dL (ref 30.0–36.0)
MCV: 91.9 fL (ref 80.0–100.0)
Platelets: 165 10*3/uL (ref 150–400)
RBC: 3.44 MIL/uL — ABNORMAL LOW (ref 4.22–5.81)
RDW: 15.8 % — ABNORMAL HIGH (ref 11.5–15.5)
WBC: 9.8 10*3/uL (ref 4.0–10.5)
nRBC: 0 % (ref 0.0–0.2)

## 2022-06-12 LAB — BASIC METABOLIC PANEL
Anion gap: 10 (ref 5–15)
BUN: <5 mg/dL — ABNORMAL LOW (ref 6–20)
CO2: 23 mmol/L (ref 22–32)
Calcium: 8.8 mg/dL — ABNORMAL LOW (ref 8.9–10.3)
Chloride: 98 mmol/L (ref 98–111)
Creatinine, Ser: 0.66 mg/dL (ref 0.61–1.24)
GFR, Estimated: >60 mL/min (ref >60)
Glucose, Bld: 94 mg/dL (ref 70–99)
Potassium: 3.5 mmol/L (ref 3.5–5.1)
Sodium: 131 mmol/L — ABNORMAL LOW (ref 135–145)

## 2022-06-12 LAB — CULTURE, BLOOD (ROUTINE X 2): Culture: NO GROWTH

## 2022-06-12 LAB — GLUCOSE, CAPILLARY
Glucose-Capillary: 116 mg/dL — ABNORMAL HIGH (ref 70–99)
Glucose-Capillary: 154 mg/dL — ABNORMAL HIGH (ref 70–99)

## 2022-06-12 MED ORDER — SODIUM CHLORIDE 0.9 % IV SOLN
2.0000 g | Freq: Every day | INTRAVENOUS | Status: DC
  Administered 2022-06-12: 2 g via INTRAVENOUS
  Filled 2022-06-12: qty 20

## 2022-06-12 MED ORDER — AMOXICILLIN-POT CLAVULANATE 875-125 MG PO TABS
1.0000 | ORAL_TABLET | Freq: Two times a day (BID) | ORAL | 0 refills | Status: DC
  Filled 2022-06-12: qty 4, 2d supply, fill #0

## 2022-06-12 MED ORDER — CHLORDIAZEPOXIDE HCL 25 MG PO CAPS
ORAL_CAPSULE | ORAL | 0 refills | Status: DC
  Filled 2022-06-12: qty 6, 4d supply, fill #0

## 2022-06-12 MED ORDER — POTASSIUM CHLORIDE CRYS ER 20 MEQ PO TBCR
40.0000 meq | EXTENDED_RELEASE_TABLET | ORAL | Status: DC
  Administered 2022-06-12: 40 meq via ORAL
  Filled 2022-06-12: qty 2

## 2022-06-12 MED ORDER — AZITHROMYCIN 500 MG PO TABS
500.0000 mg | ORAL_TABLET | Freq: Every day | ORAL | Status: DC
  Administered 2022-06-12: 500 mg via ORAL
  Filled 2022-06-12: qty 1

## 2022-06-12 NOTE — Discharge Summary (Addendum)
Name: Samuel Blevins MRN: [id number] DOB: Aug 26, 1965 57 y.o. PCP: Samuel Frisk, MD  Date of Admission: 06/09/2022  7:57 PM Date of Discharge: 06/12/2022 Attending Physician: Dr. Lynnell Catalan   Discharge Diagnosis: Principal Problem:   CAP (community acquired pneumonia) Active Problems:   Chronic pain syndrome   Alcohol withdrawal   Chronic alcohol abuse   Pancreatic insufficiency   Homeless   Hypomagnesemia   AKI (acute kidney injury)   Encephalopathy acute   Sepsis with acute organ dysfunction and septic shock   Acute on chronic pancreatitis   Acute kidney injury (nontraumatic)    Discharge Medications: Allergies as of 06/12/2022   No Known Allergies      Medication List     STOP taking these medications    erythromycin ophthalmic ointment   ofloxacin 0.3 % ophthalmic solution Commonly known as: Ocuflox       TAKE these medications    acetaminophen 325 MG tablet Commonly known as: TYLENOL Take 2 tablets (650 mg total) by mouth every 6 (six) hours as needed for fever, moderate pain, mild pain or headache.   amLODipine 5 MG tablet Commonly known as: NORVASC Take 1 tablet (5 mg total) by mouth daily.   amoxicillin-clavulanate 875-125 MG tablet Commonly known as: AUGMENTIN Take 1 tablet by mouth 2 (two) times daily for 2 days. Start taking on: June 13, 2022   chlordiazePOXIDE 25 MG capsule Commonly known as: LIBRIUM Take 1 capsule (25 mg total) by mouth 2 (two) times daily for 2 days, THEN 1 capsule (25 mg total) daily for 2 days. Start taking on: June 12, 2022   clobetasol cream 0.05 % Commonly known as: TEMOVATE Apply 1 Application topically 2 (two) times daily.   Creon 12000-38000 units Cpep capsule Generic drug: lipase/protease/amylase Take 1 capsule (12,000 Units total) by mouth 3 (three) times daily before meals.   folic acid 1 MG tablet Commonly known as: FOLVITE Take 1 tablet (1 mg total) by mouth daily.   Iron (Ferrous  Sulfate) 325 (65 Fe) MG Tabs Take 1 tablet ( 325 mg) by mouth daily.   multivitamin with minerals Tabs tablet Take 1 tablet by mouth daily.   ondansetron 8 MG tablet Commonly known as: ZOFRAN Take 1 tablet (8 mg total) by mouth every 6 (six) hours as needed for nausea.   pantoprazole 40 MG tablet Commonly known as: PROTONIX Take 1 tablet (40 mg total) by mouth 2 (two) times daily before a meal.   polyethylene glycol 17 g packet Commonly known as: MIRALAX / GLYCOLAX Take 17 g by mouth daily as needed for mild constipation or moderate constipation.   senna-docusate 8.6-50 MG tablet Commonly known as: Senokot-S Take 1 tablet by mouth at bedtime as needed for mild constipation.   sucralfate 1 g tablet Commonly known as: Carafate Take 1 tablet (1 g total) by mouth 4 (four) times daily -  with meals and at bedtime.   thiamine 100 MG tablet Commonly known as: VITAMIN B1 Take 1 tablet (100 mg total) by mouth daily.        Disposition and follow-up:   [name] was discharged from [facility] in Good condition.  At the hospital follow up visit please address:  1.  Follow-up:  a.  Community-acquired pneumonia: Patient discharged on Augmentin.  Patient no longer requiring oxygen.  Ensure patient finishes antibiotics outpatient.  Ensure patient respiratory status is stable outpatient.    b.  Alcohol use disorder/alcohol withdrawal: Patient had alcohol  withdrawal during hospitalization.  Patient started Librium taper initially, escalated to phenobarbital taper, and then discharged on Librium taper to finish.  Ensure patient remains free of alcohol consumption.  Ensure patient completes Librium taper.  Continue to monitor for further withdrawal symptoms   c.  Hyponatremia: Patient had slightly decreased sodium during hospitalization.  Repeat BMP outpatient.   2.  Labs / imaging needed at time of follow-up: BMP  3.  Pending labs/ test needing follow-up:  N/A  4.  Medication Changes  Patient discharged on Librium taper and Augmentin.  Other medication stayed the same.   Follow-up Appointments:  Follow-up Information     Samuel Frisk, MD. Call today.   Specialty: Pulmonary Disease Why: Please call to make appointment for hospital follow-up appointment. Contact information: [address] [place] [address] [phone]                 Hospital Course by problem list:  This is a 57 year old male who presented to the emergency department with concerns of abdominal pain and chest pain.  Patient found to have community-acquired pneumonia.  Patient developed hypertension and tachycardia, concern for alcohol withdrawal, and admitted for further evaluation workup.   #Acute hypoxic respiratory failure #CAP Patient initially presented to the emergency department concerns of chest pain and abdominal pain.  Patient had imaging concerning for pneumonia.  Patient was started on ceftriaxone and azithromycin.  Patient initially required 2 L of oxygen nasal cannula, and was successfully weaned off during hospitalization.  Cultures did not grow any bacteria during hospitalization.  Patient was improving.  Patient discharged on Augmentin and finished azithromycin course during hospitalization.   #Alcohol use disorder #Alcohol withdrawal #Elevated liver enzymes Patient has a past medical history of alcohol use disorder.  Patient initially admitted for hypoxic respiratory failure, started having tachycardia and hypertension with concern for alcohol withdrawal.  Patient was admitted to the ICU for this.  Patient initially started Librium taper, and escalated up to phenobarbital taper.  Patient did also require Precedex during hospitalization.  Patient was successfully weaned off Precedex,.  Heart rate came back down to normal.  Blood pressure was slightly hypertensive, but responded well.  Patient was no longer requiring Ativan.   Patient discharged on Librium taper for 4 more days.   #Chronic pancreatitis Initially patient was concerned to have acute on chronic pancreatitis with elevated lipase.  Imaging showed improving pancreatitis from prior study.  Patient advance his diet during hospitalization.  Lipase came down back to normal.    #Hyponatremia #Hypokalemia Minor electrolyte changes during hospitalization.  Potassium was repleted.  Sodium remained above 130.  Discharge Subjective: Patient evaluated bedside this morning. Patient states he is feeling better. He states that he has no concerns this morning. Denies any shortness of breath or chest pain. He states he would like to go home.   Discharge Exam:   BP (!) 157/97   Pulse 83   Temp (!) 96.5 F (35.8 C) (Axillary)   Resp 20   Ht 6\' 1"  (1.854 m)   Wt 78.8 kg   SpO2 95%   BMI 22.92 kg/m  General: Resting in bed, no acute distress HENT: Normocephalic, atraumatic Lungs: Clear to auscultation bilaterally Cardiovascular: Regular rate and rhythm, no murmurs, rubs, or gallops Abdomen: Soft, nontender, nondistended Extremities: Able to move extremities freely, no edema appreciated to bilateral lower extremities Neuro: Alert oriented x 3, able to follow commands  Pertinent Labs, Studies, and Procedures:  Latest Ref Rng & Units 06/12/2022    2:17 AM 06/11/2022    2:15 AM 06/10/2022   11:45 AM  CBC  WBC 4.0 - 10.5 K/uL 9.8  11.3  14.1   Hemoglobin 13.0 - 17.0 g/dL 16.1  9.8  09.6   Hematocrit 39.0 - 52.0 % 31.6  29.5  29.7   Platelets 150 - 400 K/uL 165  111  105        Latest Ref Rng & Units 06/12/2022    2:17 AM 06/11/2022    2:15 AM 06/10/2022    4:32 PM  CMP  Glucose 70 - 99 mg/dL 94  82    BUN 6 - 20 mg/dL <5  5    Creatinine 0.45 - 1.24 mg/dL 4.09  8.11  9.14   Sodium 135 - 145 mmol/L 131  134    Potassium 3.5 - 5.1 mmol/L 3.5  3.4    Chloride 98 - 111 mmol/L 98  98    CO2 22 - 32 mmol/L 23  24    Calcium 8.9 - 10.3 mg/dL 8.8  8.3     Total Protein 6.5 - 8.1 g/dL  7.0    Total Bilirubin 0.3 - 1.2 mg/dL  1.4    Alkaline Phos 38 - 126 U/L  81    AST 15 - 41 U/L  87    ALT 0 - 44 U/L  30      CT Angio Chest/Abd/Pel for Dissection W and/or W/WO  Result Date: 06/10/2022 CLINICAL DATA:  Chest pain, shortness of breath EXAM: CT ANGIOGRAPHY CHEST, ABDOMEN AND PELVIS TECHNIQUE: Non-contrast CT of the chest was initially obtained. Multidetector CT imaging through the chest, abdomen and pelvis was performed using the standard protocol during bolus administration of intravenous contrast. Multiplanar reconstructed images and MIPs were obtained and reviewed to evaluate the vascular anatomy. RADIATION DOSE REDUCTION: This exam was performed according to the departmental dose-optimization program which includes automated exposure control, adjustment of the mA and/or kV according to patient size and/or use of iterative reconstruction technique. CONTRAST:  OMNIPAQUE IOHEXOL 350 MG/ML SOLN COMPARISON:  CT abdomen/pelvis dated 06/07/2022 FINDINGS: CTA CHEST FINDINGS Cardiovascular: On unenhanced CT, there is no evidence of intramural hematoma. Following contrast administration, there is no evidence of thoracic aortic aneurysm or dissection. Mild atherosclerotic calcifications of the aortic arch. Although not tailored for evaluation of the pulmonary arteries, there is no evidence of central pulmonary embolism. The heart is normal in size.  No pericardial effusion. Mild coronary atherosclerosis of the LAD. Mediastinum/Nodes: No suspicious mediastinal lymphadenopathy. Visualized thyroid is unremarkable. Lungs/Pleura: Multifocal patchy opacities with right middle and lower lobe consolidation. Additional mild left lower lobe patchy opacity. This appearance is compatible with multifocal pneumonia and is new from the recent prior CT abdomen/pelvis. No suspicious pulmonary nodules. No pleural effusion or pneumothorax. Musculoskeletal: Visualized osseous  structures are within normal limits. Review of the MIP images confirms the above findings. CTA ABDOMEN AND PELVIS FINDINGS VASCULAR Aorta: No evidence of abdominal aortic aneurysm or dissection. Patent. Atherosclerotic calcifications. Celiac: Patent. SMA: Patent.  Atherosclerotic calcifications at the origin. Renals: Patent bilaterally. Atherosclerotic calcifications of the origin on the left. IMA: Patent. Inflow: Patent bilaterally.  Atherosclerotic calcifications. Veins: Grossly unremarkable. Review of the MIP images confirms the above findings. NON-VASCULAR Hepatobiliary: Moderate hepatic steatosis with focal fatty sparing along the gallbladder fossa. Gallbladder is unremarkable. No intrahepatic or extrahepatic ductal dilatation. Pancreas: Mild fluid/inflammatory changes along the pancreaticoduodenal groove (series 7/image 175), less conspicuous  than on the recent prior, favored to be improved. Spleen: Within normal limits. Adrenals/Urinary Tract: Adrenal glands are within normal limits. Kidneys are within normal limits.  No hydronephrosis. Bladder is within normal limits. Stomach/Bowel: Stomach is within normal limits. No evidence of bowel obstruction. Normal appendix (series 7/image 233). No colonic wall thickening or inflammatory changes. Lymphatic: No suspicious abdominopelvic lymphadenopathy. Reproductive: Mild enlargement of the central gland of the prostate, suggesting BPH. Other: No abdominopelvic ascites. Musculoskeletal: Degenerative changes at L5-S1. Review of the MIP images confirms the above findings. IMPRESSION: No thoracoabdominal aortic aneurysm or dissection. Multifocal pneumonia, right middle and lower lobe predominant, new from the recent prior CT abdomen/pelvis. Improving groove pancreatitis. Moderate hepatic steatosis. Electronically Signed   By: Charline Bills M.D.   On: 06/10/2022 02:33   DG Chest 2 View  Result Date: 06/09/2022 CLINICAL DATA:  Chest pain, shortness of breath. EXAM:  CHEST - 2 VIEW COMPARISON:  06/06/2022. FINDINGS: The heart is enlarged and mediastinal contours are stable. There is atherosclerotic calcification of the aorta. Pulmonary vasculature is mildly distended. Mild airspace disease is present at the lung bases bilaterally. No effusion or pneumothorax. No acute osseous abnormality. IMPRESSION: 1. Distended pulmonary vasculature. 2. Airspace disease at the lung bases, possible edema or infiltrate. Electronically Signed   By: Thornell Sartorius M.D.   On: 06/09/2022 20:49     Discharge Instructions: Discharge Instructions     Call MD for:  persistant dizziness or light-headedness   Complete by: As directed    Call MD for:  persistant nausea and vomiting   Complete by: As directed    Call MD for:  redness, tenderness, or signs of infection (pain, swelling, redness, odor or green/yellow discharge around incision site)   Complete by: As directed    Call MD for:  temperature >100.4   Complete by: As directed    Diet - low sodium heart healthy   Complete by: As directed    Increase activity slowly   Complete by: As directed       Mr. Gustavo Dispenza, It was a pleasure taking care of you at [facility]. You were admitted for pneumonia and alcohol withdrawal. We are discharging you home now that you are doing better. Please follow the following instructions.   1) For your pneumonia, we are sending you home on some antibiotics.  This antibiotic is called Augmentin.  Please take this starting tomorrow 06/13/2022 as you already received your antibiotic dose for today.  Please take this for 2 days as directed.  2) For your alcohol withdrawal, we have discharged you on a Librium taper.  Please take your Librium as directed.  Please try to avoid alcohol use.  3) Please follow-up with your primary care physician in about 1 week for hospital follow-up.  4) Please take all of your other medications as directed.  Take care,  Dr. Modena Slater, DO    Signed: Modena Slater, DO 06/12/2022, 9:18 AM   Pager: [phone]

## 2022-06-12 NOTE — TOC Transition Note (Signed)
Transition of Care [facility]) - CM/SW Discharge Note   Patient Details  Name: Samuel Blevins MRN: [id number] Date of Birth: [birth year]  Transition of Care [facility]) CM/SW Contact:  Harriet Masson, RN Phone Number: 06/12/2022, 9:38 AM   Clinical Narrative:    Patient stable for discharge.  Patient plans to return to homeless shelter.  Bus pass given. Patient will follow up with [facility].   Final next level of care: Homeless Shelter Barriers to Discharge: Barriers Resolved   Patient Goals and CMS Choice  Return to shelter    Discharge Placement               shelter          Discharge Plan and Services Additional resources added to the After Visit Summary for                                       Social Determinants of Health (SDOH) Interventions SDOH Screenings   Food Insecurity: Food Insecurity Present (06/10/2022)  Housing: Medium Risk (06/10/2022)  Transportation Needs: Patient Declined (06/10/2022)  Utilities: Patient Declined (06/10/2022)  Depression (PHQ2-9): High Risk (02/14/2022)  Tobacco Use: High Risk (06/10/2022)     Readmission Risk Interventions     No data to display

## 2022-06-12 NOTE — Discharge Instructions (Signed)
Samuel Blevins, It was a pleasure taking care of you at [facility]. You were admitted for pneumonia and alcohol withdrawal. We are discharging you home now that you are doing better. Please follow the following instructions.   1) For your pneumonia, we are sending you home on some antibiotics.  This antibiotic is called Augmentin.  Please take this starting tomorrow 06/13/2022 as you already received your antibiotic dose for today.  Please take this for 2 days as directed.  2) For your alcohol withdrawal, we have discharged you on a Librium taper.  Please take your Librium as directed.  Please try to avoid alcohol use.  3) Please follow-up with your primary care physician in about 1 week for hospital follow-up.  4) Please take all of your other medications as directed.  Take care,  Dr. Modena Slater, DO

## 2022-06-12 NOTE — Progress Notes (Signed)
NAME:  Samuel Blevins, MRN:  [id number], DOB:  [birth year], LOS: 2 ADMISSION DATE:  06/09/2022, CONSULTATION DATE:  06/10/22 REFERRING MD:  Janee Morn, CHIEF COMPLAINT:  ETOH Withdrawal    History of Present Illness:  Samuel Blevins is a 57 y.o. M with PMH significant for ETOH abuse, HTN, thrombocytopenia who presented to the ED with chest and abdominal pain and shortness of breath from [employer] on 4/15.  He was found to have CAP and pancreatitis.  Troponins were flat, ETOH 358, lipase 62, creatinine 1.4, lactic acid 2.1.  A CT abd/pelvis pelvis was obtained showing peripancreatic inflammation.  He was treated with 3L IVF, azithromycin and ceftriaxone and admitted to [facility].   He required over  Ativan and Librium and remained restless and tachycardic, so PCCM consulted.  Repeat CT chest/abd/pelvis for dissection showed multi-focal pneumonia and was requiring 2L O2   Pertinent  Medical History  Past medical history of Alcohol withdrawal syndrome with complication, Alcoholism, Elevated AST (SGOT), Gastrointestinal hemorrhage, Homeless, Hypertension, Pancreatic insufficiency, Symptomatic anemia (11/23/2015), and Thrombocytopenia (05/09/2021).   Significant Hospital Events: Including procedures, antibiotic start and stop dates in addition to other pertinent events   4/15 admit for pancreatitis, CAP, ETOH withdrawal, PCCM consult   Interim History / Subjective:  Patient evaluated bedside this morning.  Patient states he is feeling better.  He states that he has no concerns this morning.  Denies any shortness of breath or chest pain.  He states he would like to go home.  Objective   Blood pressure (!) 157/97, pulse 83, temperature (!) 96.5 F (35.8 C), temperature source Axillary, resp. rate 20, height  (1.854 m), weight 78.8 kg, SpO2 95 %.        Intake/Output Summary (Last 24 hours) at 06/12/2022 0917 Last data filed at 06/12/2022 0600 Gross per 24 hour  Intake 2458.66 ml  Output 3600  ml  Net -1141.34 ml   Filed Weights   06/09/22 2013 06/11/22 0139 06/11/22 0500  Weight: 77.1 kg 78.8 kg 78.8 kg    Examination: General: Resting in bed, no acute distress HENT: Normocephalic, atraumatic Lungs: Clear to auscultation bilaterally Cardiovascular: Regular rate and rhythm, no murmurs, rubs, or gallops Abdomen: Soft, nontender, nondistended Extremities: Able to move extremities freely, no edema appreciated to bilateral lower extremities Neuro: Alert oriented x 3, able to follow commands  Resolved Hospital Problem list     Assessment & Plan:  This is a 57 year old male who presented to the emergency department with concerns of abdominal pain and chest pain.  Patient found to have community-acquired pneumonia.  Patient developed hypertension and tachycardia, concern for alcohol withdrawal, and admitted for further evaluation workup.  #Acute hypoxic respiratory failure, resolved #CAP Patient evaluated bedside this morning.  Patient is no longer requiring oxygen.  Patient is on day 2 of antibiotics.  Strep antigen and Legionella antigen negative.  On exam, patient has clear breath sounds bilaterally. -Continue to monitor respiratory status -Cultures with no growth so far -Continue ceftriaxone and azithromycin day 2  #Alcohol use disorder #Alcohol withdrawal #Elevated liver enzymes Patient is approaching his 72-hour window.  Patient on phenobarbital taper.  Not requiring Ativan anymore.  Past 2 CIWA's have been 4.  On exam, patient does have mild tremors.  He is no longer tachycardic, but is slightly hypertensive.  Did have conversation about patient wanting to leave, he understands risk of leaving if he does leave AMA.  Will have patient walk today. -Continue phenobarbital taper -Continue to monitor CIWA  scores -Continue thiamine and folate -Will walk patient today, and see how he does -Can transfer out of ICU today  #Chronic pancreatitis Patient denies any abdominal  pain today.  His lipase is back to normal.  No acute concerns at this time. -Continue to monitor, advance diet as tolerated  #Hyponatremia #Hypokalemia, resolved Sodium down to 131 today.  Potassium at 3.5. -monitor   Best Practice (right click and "Reselect all SmartList Selections" daily)   Diet/type: clear liquids DVT prophylaxis: SCD GI prophylaxis: PPI Lines: N/A Foley:  N/A Code Status:  full code  Labs   CBC: Recent Labs  Lab 06/06/22 1355 06/09/22 2026 06/10/22 1145 06/11/22 0215 06/12/22 0217  WBC 4.1 10.9* 14.1* 11.3* 9.8  NEUTROABS  --   --  12.7*  --   --   HGB 10.8* 11.7* 10.1* 9.8* 10.9*  HCT 31.5* 34.7* 29.7* 29.5* 31.6*  MCV 92.1 93.3 92.5 94.2 91.9  PLT 109* 123* 105* 111* 165    Basic Metabolic Panel: Recent Labs  Lab 06/06/22 1355 06/09/22 2026 06/09/22 2138 06/10/22 1048 06/10/22 1632 06/11/22 0215 06/12/22 0217  NA 138 136  --  129*  --  134* 131*  K 3.0* 3.2*  --  3.5  --  3.4* 3.5  CL 99 99  --  96*  --  98 98  CO2 24 22  --  21*  --  24 23  GLUCOSE 123* 93  --  102*  --  82 94  BUN 5* 13  --  7  --  5* <5*  CREATININE 0.52* 1.40*  --  0.75 0.71 0.59* 0.66  CALCIUM 8.6* 8.7*  --  8.0*  --  8.3* 8.8*  MG  --   --  1.4*  --   --  1.9  --    GFR: Estimated Creatinine Clearance: 112.2 mL/min (by C-G formula based on SCr of 0.66 mg/dL). Recent Labs  Lab 06/09/22 2026 06/10/22 0335 06/10/22 1043 06/10/22 1145 06/10/22 1757 06/11/22 0215 06/12/22 0217  WBC 10.9*  --   --  14.1*  --  11.3* 9.8  LATICACIDVEN  --  2.1* 2.3*  --  1.9  --   --     Liver Function Tests: Recent Labs  Lab 06/07/22 0600 06/09/22 2026 06/10/22 1048 06/11/22 0215  AST 109* 102* 79* 87*  ALT 33 32 26 30  ALKPHOS 111 117 90 81  BILITOT 0.9 1.0 1.4* 1.4*  PROT 8.5* 8.8* 7.3 7.0  ALBUMIN 3.4* 3.6 2.9* 2.6*   Recent Labs  Lab 06/07/22 0600 06/10/22 1048  LIPASE 62* 28   No results for input(s): "AMMONIA" in the last 168 hours.  ABG     Component Value Date/Time   TCO2 23 08/26/2020 1141     Coagulation Profile: No results for input(s): "INR", "PROTIME" in the last 168 hours.  Cardiac Enzymes: No results for input(s): "CKTOTAL", "CKMB", "CKMBINDEX", "TROPONINI" in the last 168 hours.  HbA1C: Hgb A1c MFr Bld  Date/Time Value Ref Range Status  08/02/2021 12:47 PM 5.2 4.8 - 5.6 % Final    Comment:             Prediabetes: 5.7 - 6.4          Diabetes: >6.4          Glycemic control for adults with diabetes: <7.0     CBG: Recent Labs  Lab 06/11/22 1543 06/11/22 1941 06/11/22 2335 06/12/22 0337 06/12/22 0754  GLUCAP 80 92 108*  116* 154*    Review of Systems:   Negative except for what is stated in HPI  Past Medical History:  He,  has a past medical history of Alcohol withdrawal syndrome with complication, Alcoholism, Elevated AST (SGOT), Gastrointestinal hemorrhage, Homeless, Hypertension, Pancreatic insufficiency, Symptomatic anemia (11/23/2015), and Thrombocytopenia (05/09/2021).   Surgical History:   Past Surgical History:  Procedure Laterality Date   BIOPSY  06/23/2019   Procedure: BIOPSY;  Surgeon: Shellia Cleverly, DO;  Location: [facility] ENDOSCOPY;  Service: Gastroenterology;;   BIOPSY  12/12/2020   Procedure: BIOPSY;  Surgeon: Lynann Bologna, MD;  Location: [facility] ENDOSCOPY;  Service: Endoscopy;;   BIOPSY  08/04/2021   Procedure: BIOPSY;  Surgeon: Jenel Lucks, MD;  Location: [facility] ENDOSCOPY;  Service: Gastroenterology;;   ESOPHAGOGASTRODUODENOSCOPY N/A 08/04/2021   Procedure: ESOPHAGOGASTRODUODENOSCOPY (EGD);  Surgeon: Jenel Lucks, MD;  Location: [facility] ENDOSCOPY;  Service: Gastroenterology;  Laterality: N/A;   ESOPHAGOGASTRODUODENOSCOPY (EGD) WITH PROPOFOL N/A 06/23/2019   Procedure: ESOPHAGOGASTRODUODENOSCOPY (EGD) WITH PROPOFOL;  Surgeon: Shellia Cleverly, DO;  Location: [facility] ENDOSCOPY;  Service: Gastroenterology;  Laterality: N/A;   ESOPHAGOGASTRODUODENOSCOPY (EGD) WITH PROPOFOL N/A 12/12/2020    Procedure: ESOPHAGOGASTRODUODENOSCOPY (EGD) WITH PROPOFOL;  Surgeon: Lynann Bologna, MD;  Location: [facility] ENDOSCOPY;  Service: Endoscopy;  Laterality: N/A;   HOT HEMOSTASIS N/A 06/23/2019   Procedure: HOT HEMOSTASIS (ARGON PLASMA COAGULATION/BICAP);  Surgeon: Shellia Cleverly, DO;  Location: [facility] ENDOSCOPY;  Service: Gastroenterology;  Laterality: N/A;     Social History:   reports that he has been smoking cigarettes. He has a 15.00 pack-year smoking history. He has never used smokeless tobacco. He reports current alcohol use of about 28.0 standard drinks of alcohol per week. He reports that he does not use drugs.   Family History:  His family history is negative for Diabetes Mellitus II, Colon cancer, Stomach cancer, and Pancreatic cancer.   Allergies No Known Allergies   Home Medications  Prior to Admission medications   Medication Sig Start Date End Date Taking? Authorizing Provider  erythromycin ophthalmic ointment Place 1 Application into both eyes at bedtime. 06/05/22  Yes [name], MD  acetaminophen (TYLENOL) 325 MG tablet Take 2 tablets (650 mg total) by mouth every 6 (six) hours as needed for fever, moderate pain, mild pain or headache. 12/12/20   Katsadouros, Vasilios, MD  amLODipine (NORVASC) 5 MG tablet Take 1 tablet (5 mg total) by mouth daily. 04/18/22   Lyndle Herrlich, MD  clobetasol cream (TEMOVATE) 0.05 % Apply 1 Application topically 2 (two) times daily. 03/06/22   [name], MD  folic acid (FOLVITE) 1 MG tablet Take 1 tablet (1 mg total) by mouth daily. 10/17/21   [name], MD  Iron, Ferrous Sulfate, 325 (65 Fe) MG TABS Take 1 tablet ( 325 mg) by mouth daily. 05/24/22   [name], MD  lipase/protease/amylase (CREON) 12000-38000 units CPEP capsule Take 1 capsule (12,000 Units total) by mouth 3 (three) times daily before meals. 04/18/22   Lyndle Herrlich, MD  Multiple Vitamin (MULTIVITAMIN WITH MINERALS) TABS tablet Take 1 tablet by  mouth daily. 09/27/21   [name], MD  ofloxacin (OCUFLOX) 0.3 % ophthalmic solution Place 1 drop into both eyes 4 (four) times daily. 03/20/22   [name], MD  ondansetron (ZOFRAN) 8 MG tablet Take 1 tablet (8 mg total) by mouth every 6 (six) hours as needed for nausea. 01/23/22   [name], MD  pantoprazole (PROTONIX) 40 MG tablet Take 1 tablet (40  mg total) by mouth 2 (two) times daily before a meal. 04/18/22   Lyndle Herrlich, MD  polyethylene glycol (MIRALAX / GLYCOLAX) 17 g packet Take 17 g by mouth daily as needed for mild constipation or moderate constipation. 10/07/21   Rocky Morel, DO  senna-docusate (SENOKOT-S) 8.6-50 MG tablet Take 1 tablet by mouth at bedtime as needed for mild constipation. 10/07/21   Rocky Morel, DO  sucralfate (CARAFATE) 1 g tablet Take 1 tablet (1 g total) by mouth 4 (four) times daily -  with meals and at bedtime. 12/27/21   [name], MD  thiamine (VITAMIN B1) 100 MG tablet Take 1 tablet (100 mg total) by mouth daily. 10/02/21   [name], MD     Critical care time: 40 minutes    Modena Slater, DO Internal Medicine Resident PGY-1 Pager: [phone]

## 2022-06-13 ENCOUNTER — Emergency Department (HOSPITAL_COMMUNITY): Payer: Medicaid Other

## 2022-06-13 ENCOUNTER — Telehealth: Payer: Self-pay

## 2022-06-13 ENCOUNTER — Inpatient Hospital Stay (HOSPITAL_COMMUNITY)
Admission: EM | Admit: 2022-06-13 | Discharge: 2022-06-18 | DRG: 871 | Disposition: A | Discharge status: Home / Self Care | Payer: Medicaid Other | Attending: Internal Medicine | Admitting: Internal Medicine

## 2022-06-13 DIAGNOSIS — A419 Sepsis, unspecified organism: Principal | ICD-10-CM | POA: Diagnosis present

## 2022-06-13 DIAGNOSIS — F10139 Alcohol abuse with withdrawal, unspecified: Secondary | ICD-10-CM | POA: Diagnosis present

## 2022-06-13 DIAGNOSIS — E871 Hypo-osmolality and hyponatremia: Secondary | ICD-10-CM | POA: Diagnosis present

## 2022-06-13 DIAGNOSIS — F10939 Alcohol use, unspecified with withdrawal, unspecified: Secondary | ICD-10-CM | POA: Diagnosis present

## 2022-06-13 DIAGNOSIS — Z1152 Encounter for screening for COVID-19: Secondary | ICD-10-CM

## 2022-06-13 DIAGNOSIS — F17210 Nicotine dependence, cigarettes, uncomplicated: Secondary | ICD-10-CM | POA: Diagnosis present

## 2022-06-13 DIAGNOSIS — J189 Pneumonia, unspecified organism: Secondary | ICD-10-CM | POA: Diagnosis present

## 2022-06-13 DIAGNOSIS — E860 Dehydration: Secondary | ICD-10-CM | POA: Diagnosis present

## 2022-06-13 DIAGNOSIS — F419 Anxiety disorder, unspecified: Secondary | ICD-10-CM | POA: Diagnosis present

## 2022-06-13 DIAGNOSIS — F32A Depression, unspecified: Secondary | ICD-10-CM | POA: Diagnosis present

## 2022-06-13 DIAGNOSIS — F1010 Alcohol abuse, uncomplicated: Secondary | ICD-10-CM | POA: Diagnosis present

## 2022-06-13 DIAGNOSIS — G9341 Metabolic encephalopathy: Secondary | ICD-10-CM | POA: Diagnosis present

## 2022-06-13 DIAGNOSIS — I10 Essential (primary) hypertension: Secondary | ICD-10-CM | POA: Diagnosis present

## 2022-06-13 DIAGNOSIS — J690 Pneumonitis due to inhalation of food and vomit: Secondary | ICD-10-CM | POA: Diagnosis present

## 2022-06-13 DIAGNOSIS — K860 Alcohol-induced chronic pancreatitis: Secondary | ICD-10-CM | POA: Diagnosis present

## 2022-06-13 DIAGNOSIS — K852 Alcohol induced acute pancreatitis without necrosis or infection: Secondary | ICD-10-CM | POA: Diagnosis present

## 2022-06-13 DIAGNOSIS — Z79899 Other long term (current) drug therapy: Secondary | ICD-10-CM

## 2022-06-13 DIAGNOSIS — E876 Hypokalemia: Secondary | ICD-10-CM | POA: Diagnosis present

## 2022-06-13 DIAGNOSIS — Z5900 Homelessness unspecified: Secondary | ICD-10-CM

## 2022-06-13 LAB — COMPREHENSIVE METABOLIC PANEL
ALT: 28 U/L (ref 0–44)
AST: 51 U/L — ABNORMAL HIGH (ref 15–41)
Albumin: 3 g/dL — ABNORMAL LOW (ref 3.5–5.0)
Alkaline Phosphatase: 84 U/L (ref 38–126)
Anion gap: 20 — ABNORMAL HIGH (ref 5–15)
BUN: 14 mg/dL (ref 6–20)
CO2: 17 mmol/L — ABNORMAL LOW (ref 22–32)
Calcium: 9.5 mg/dL (ref 8.9–10.3)
Chloride: 95 mmol/L — ABNORMAL LOW (ref 98–111)
Creatinine, Ser: 1.01 mg/dL (ref 0.61–1.24)
GFR, Estimated: >60 mL/min (ref >60)
Glucose, Bld: 73 mg/dL (ref 70–99)
Potassium: 3.4 mmol/L — ABNORMAL LOW (ref 3.5–5.1)
Sodium: 132 mmol/L — ABNORMAL LOW (ref 135–145)
Total Bilirubin: 2.1 mg/dL — ABNORMAL HIGH (ref 0.3–1.2)
Total Protein: 7.9 g/dL (ref 6.5–8.1)

## 2022-06-13 LAB — CBC WITH DIFFERENTIAL/PLATELET
Abs Immature Granulocytes: 0.41 10*3/uL — ABNORMAL HIGH (ref 0.00–0.07)
Basophils Absolute: 0.1 10*3/uL (ref 0.0–0.1)
Basophils Relative: 1 %
Eosinophils Absolute: 0 10*3/uL (ref 0.0–0.5)
Eosinophils Relative: 0 %
HCT: 30.3 % — ABNORMAL LOW (ref 39.0–52.0)
Hemoglobin: 10.2 g/dL — ABNORMAL LOW (ref 13.0–17.0)
Immature Granulocytes: 4 %
Lymphocytes Relative: 12 %
Lymphs Abs: 1.2 10*3/uL (ref 0.7–4.0)
MCH: 31.3 pg (ref 26.0–34.0)
MCHC: 33.7 g/dL (ref 30.0–36.0)
MCV: 92.9 fL (ref 80.0–100.0)
Monocytes Absolute: 2 10*3/uL — ABNORMAL HIGH (ref 0.1–1.0)
Monocytes Relative: 20 %
Neutro Abs: 6.6 10*3/uL (ref 1.7–7.7)
Neutrophils Relative %: 63 %
Platelets: 272 10*3/uL (ref 150–400)
RBC: 3.26 MIL/uL — ABNORMAL LOW (ref 4.22–5.81)
RDW: 15.7 % — ABNORMAL HIGH (ref 11.5–15.5)
WBC: 10.4 10*3/uL (ref 4.0–10.5)
nRBC: 0 % (ref 0.0–0.2)

## 2022-06-13 LAB — CULTURE, BLOOD (ROUTINE X 2): Culture: NO GROWTH

## 2022-06-13 MED ORDER — LACTATED RINGERS IV BOLUS (SEPSIS): 500.0000 mL | Freq: Once | INTRAVENOUS | Status: DC

## 2022-06-13 MED ORDER — SODIUM CHLORIDE 0.9 % IV SOLN
2.0000 g | Freq: Three times a day (TID) | INTRAVENOUS | Status: DC
  Administered 2022-06-14 – 2022-06-15 (×5): 2 g via INTRAVENOUS
  Filled 2022-06-13 (×6): qty 12.5

## 2022-06-13 MED ORDER — SODIUM CHLORIDE 0.9 % IV SOLN
500.0000 mg | Freq: Once | INTRAVENOUS | Status: AC
  Administered 2022-06-13: 500 mg via INTRAVENOUS
  Filled 2022-06-13: qty 5

## 2022-06-13 MED ORDER — LACTATED RINGERS IV SOLN: INTRAVENOUS | Status: AC

## 2022-06-13 MED ORDER — ACETAMINOPHEN 500 MG PO TABS
1000.0000 mg | ORAL_TABLET | ORAL | Status: AC
  Administered 2022-06-13: 1000 mg via ORAL
  Filled 2022-06-13: qty 2

## 2022-06-13 MED ORDER — LACTATED RINGERS IV BOLUS (SEPSIS)
1000.0000 mL | Freq: Once | INTRAVENOUS | Status: AC
  Administered 2022-06-13: 1000 mL via INTRAVENOUS

## 2022-06-13 NOTE — ED Provider Notes (Signed)
[place] EMERGENCY DEPARTMENT AT [facility] Provider Note   CSN: [id number] Arrival date & time: 06/13/22  2206     History  Chief Complaint  Patient presents with   Weakness    Pneumonia symptoms     Samuel Blevins is a 57 y.o. male.   Weakness Patient is a 57 year old male with past medical history significant for chronic anemia, HTN, homelessness, heavy alcohol use with severe withdrawal symptoms recently detoxed here during hospital stay, thrombocytopenia, GI bleeds  Patient presents emergency room today somnolence and febrile and ill-appearing brought in by EMS for the symptoms above.  He was discharged yesterday for multifocal pneumonia.   He states that he has not had any antibiotics today.  He states that he has become so weak he is unable to get up and move around.  He was brought in by EMS from [facility] where he is staying.  He states he has not had much of an appetite     Home Medications Prior to Admission medications   Medication Sig Start Date End Date Taking? Authorizing Provider  acetaminophen (TYLENOL) 325 MG tablet Take 2 tablets (650 mg total) by mouth every 6 (six) hours as needed for fever, moderate pain, mild pain or headache. 12/12/20   Katsadouros, Vasilios, MD  amLODipine (NORVASC) 5 MG tablet Take 1 tablet (5 mg total) by mouth daily. 04/18/22   Lyndle Herrlich, MD  amoxicillin-clavulanate (AUGMENTIN) 875-125 MG tablet Take 1 tablet by mouth 2 (two) times daily for 2 days. 06/13/22 06/15/22  Modena Slater, DO  chlordiazePOXIDE (LIBRIUM) 25 MG capsule Take 1 capsule (25 mg total) by mouth 2 (two) times daily for 2 days, THEN 1 capsule (25 mg total) daily for 2 days. 06/12/22 06/16/22  Modena Slater, DO  clobetasol cream (TEMOVATE) 0.05 % Apply 1 Application topically 2 (two) times daily. 03/06/22   [name], MD  folic acid (FOLVITE) 1 MG tablet Take 1 tablet (1 mg total) by mouth daily. 10/17/21   [name], MD  Iron,  Ferrous Sulfate, 325 (65 Fe) MG TABS Take 1 tablet ( 325 mg) by mouth daily. 05/24/22   [name], MD  lipase/protease/amylase (CREON) 12000-38000 units CPEP capsule Take 1 capsule (12,000 Units total) by mouth 3 (three) times daily before meals. 04/18/22   Lyndle Herrlich, MD  Multiple Vitamin (MULTIVITAMIN WITH MINERALS) TABS tablet Take 1 tablet by mouth daily. 09/27/21   [name], MD  ondansetron (ZOFRAN) 8 MG tablet Take 1 tablet (8 mg total) by mouth every 6 (six) hours as needed for nausea. 01/23/22   [name], MD  pantoprazole (PROTONIX) 40 MG tablet Take 1 tablet (40 mg total) by mouth 2 (two) times daily before a meal. 04/18/22   Lyndle Herrlich, MD  polyethylene glycol (MIRALAX / GLYCOLAX) 17 g packet Take 17 g by mouth daily as needed for mild constipation or moderate constipation. 10/07/21   Rocky Morel, DO  senna-docusate (SENOKOT-S) 8.6-50 MG tablet Take 1 tablet by mouth at bedtime as needed for mild constipation. 10/07/21   Rocky Morel, DO  sucralfate (CARAFATE) 1 g tablet Take 1 tablet (1 g total) by mouth 4 (four) times daily -  with meals and at bedtime. 12/27/21   [name], MD  thiamine (VITAMIN B1) 100 MG tablet Take 1 tablet (100 mg total) by mouth daily. 10/02/21   [name], MD      Allergies    Patient has no known  allergies.    Review of Systems   Review of Systems  Neurological:  Positive for weakness.    Physical Exam Updated Vital Signs BP (!) 136/90   Pulse (!) 102   Temp (!) 102.2 F (39 C)   Resp (!) 27   SpO2 100%  Physical Exam Vitals and nursing note reviewed.  Constitutional:      Appearance: He is ill-appearing.  HENT:     Head: Normocephalic and atraumatic.     Nose: Nose normal.     Mouth/Throat:     Mouth: Mucous membranes are dry.  Eyes:     General: No scleral icterus. Cardiovascular:     Rate and Rhythm: Normal rate and regular rhythm.     Pulses: Normal pulses.     Heart  sounds: Normal heart sounds.  Pulmonary:     Effort: Pulmonary effort is normal. No respiratory distress.     Breath sounds: No wheezing.  Abdominal:     Palpations: Abdomen is soft.     Tenderness: There is abdominal tenderness. There is no guarding or rebound.     Comments: Epigastric and right upper quadrant tenderness no guarding or rebound  Musculoskeletal:     Cervical back: Normal range of motion.     Right lower leg: No edema.     Left lower leg: No edema.     Comments: No lower extremity edema  Skin:    General: Skin is warm and dry.     Capillary Refill: Capillary refill takes less than 2 seconds.  Neurological:     Mental Status: He is alert. Mental status is at baseline.  Psychiatric:        Mood and Affect: Mood normal.        Behavior: Behavior normal.    ED Results / Procedures / Treatments   Labs (all labs ordered are listed, but only abnormal results are displayed) Labs Reviewed  COMPREHENSIVE METABOLIC PANEL - Abnormal; Notable for the following components:      Result Value   Sodium 132 (*)    Potassium 3.4 (*)    Chloride 95 (*)    CO2 17 (*)    Albumin 3.0 (*)    AST 51 (*)    Total Bilirubin 2.1 (*)    Anion gap 20 (*)    All other components within normal limits  CBC WITH DIFFERENTIAL/PLATELET - Abnormal; Notable for the following components:   RBC 3.26 (*)    Hemoglobin 10.2 (*)    HCT 30.3 (*)    RDW 15.7 (*)    Monocytes Absolute 2.0 (*)    Abs Immature Granulocytes 0.41 (*)    All other components within normal limits  APTT - Abnormal; Notable for the following components:   aPTT 37 (*)    All other components within normal limits  RESP PANEL BY RT-PCR (RSV, FLU A&B, COVID)  RVPGX2  CULTURE, BLOOD (ROUTINE X 2)  CULTURE, BLOOD (ROUTINE X 2)  MRSA NEXT GEN BY PCR, NASAL  ETHANOL  LACTIC ACID, PLASMA  PROTIME-INR  URINALYSIS, ROUTINE W REFLEX MICROSCOPIC    EKG EKG Interpretation  Date/Time:  Thursday June 13 2022 23:08:53  EDT Ventricular Rate:  109 PR Interval:  128 QRS Duration: 101 QT Interval:  345 QTC Calculation: 465 R Axis:   94 Text Interpretation: Sinus tachycardia Consider right atrial enlargement Borderline right axis deviation Confirmed by Zadie Rhine ([id number]) on 06/13/2022 11:41:17 PM  Radiology CT ABDOMEN PELVIS W CONTRAST  Result Date: 06/14/2022 CLINICAL DATA:  Discharged yesterday for pneumonia. Very weak. Abdominal pain with some diffuse tenderness to palpation and right upper quadrant pain EXAM: CT ABDOMEN AND PELVIS WITH CONTRAST TECHNIQUE: Multidetector CT imaging of the abdomen and pelvis was performed using the standard protocol following bolus administration of intravenous contrast. RADIATION DOSE REDUCTION: This exam was performed according to the departmental dose-optimization program which includes automated exposure control, adjustment of the mA and/or kV according to patient size and/or use of iterative reconstruction technique. CONTRAST:  75mL OMNIPAQUE IOHEXOL 350 MG/ML SOLN COMPARISON:  CT chest abdomen and pelvis 06/10/2022 FINDINGS: Lower chest: Bilateral airspace opacities in the lower lungs compatible with multifocal pneumonia. Hepatobiliary: Hepatic steatosis. The gallbladder is decompressed. No biliary dilation. Pancreas: Similar to slightly decreased fluid and inflammatory changes along the pancreatico duodenal groove (series 3/image 37-40). No organized fluid collection. No pancreatic necrosis the portal vein is patent. Spleen: Unremarkable. Adrenals/Urinary Tract: Normal adrenal glands. No urinary calculi or hydronephrosis. Nondistended bladder. Stomach/Bowel: Normal caliber large and small bowel. Normal appendix. Mild wall thickening of the duodenum in the region of the pancreatic head. Stomach is within normal limits. Vascular/Lymphatic: Aortic atherosclerotic calcification. No lymphadenopathy. Reproductive: Unremarkable. Other: No free intraperitoneal air. Musculoskeletal: No  acute osseous abnormality. IMPRESSION: 1. Improving groove pancreatitis. 2. Multifocal pneumonia. 3. Hepatic steatosis. Aortic Atherosclerosis (ICD10-I70.0). Electronically Signed   By: Minerva Fester M.D.   On: 06/14/2022 01:27   DG Chest Port 1 View  Result Date: 06/13/2022 CLINICAL DATA:  Questionable sepsis - evaluate for abnormality EXAM: PORTABLE CHEST 1 VIEW COMPARISON:  CT angio chest 06/10/2022, chest x-ray 06/09/2022 FINDINGS: The heart and mediastinal contours are unchanged. Aortic calcification. Persistent right base and right middle lobe consolidation. No pulmonary edema. No pleural effusion. No pneumothorax. No acute osseous abnormality. IMPRESSION: 1. Persistent right base and right middle lobe infection. Followup PA and lateral chest X-ray is recommended in 3-4 weeks following therapy to ensure resolution. 2.  Aortic Atherosclerosis (ICD10-I70.0). Electronically Signed   By: Tish Frederickson M.D.   On: 06/13/2022 22:51    Procedures .Critical Care  Performed by: Gailen Shelter, PA Authorized by: Gailen Shelter, PA   Critical care provider statement:    Critical care time (minutes):  35   Critical care time was exclusive of:  Separately billable procedures and treating other patients and teaching time   Critical care was necessary to treat or prevent imminent or life-threatening deterioration of the following conditions:  Sepsis   Critical care was time spent personally by me on the following activities:  Development of treatment plan with patient or surrogate, review of old charts, re-evaluation of patient's condition, pulse oximetry, ordering and review of radiographic studies, ordering and review of laboratory studies, ordering and performing treatments and interventions, obtaining history from patient or surrogate, examination of patient and evaluation of patient's response to treatment   Care discussed with: admitting provider       Medications Ordered in ED Medications   lactated ringers infusion ( Intravenous New Bag/Given 06/14/22 0051)  ceFEPIme (MAXIPIME) 2 g in sodium chloride 0.9 % 100 mL IVPB (2 g Intravenous New Bag/Given 06/14/22 0045)  acetaminophen (TYLENOL) tablet 1,000 mg (1,000 mg Oral Given 06/13/22 2344)  lactated ringers bolus 1,000 mL (0 mLs Intravenous Stopped 06/14/22 0049)    And  lactated ringers bolus 1,000 mL (0 mLs Intravenous Stopped 06/14/22 0049)  azithromycin (ZITHROMAX) 500 mg in sodium chloride 0.9 % 250 mL IVPB (0 mg Intravenous Stopped 06/14/22  0042)  iohexol (OMNIPAQUE) 350 MG/ML injection 75 mL (75 mLs Intravenous Contrast Given 06/14/22 0118)    ED Course/ Medical Decision Making/ A&P                             Medical Decision Making Amount and/or Complexity of Data Reviewed Labs: ordered. Radiology: ordered. ECG/medicine tests: ordered.  Risk OTC drugs. Prescription drug management. Decision regarding hospitalization.   This patient presents to the ED for concern of SOB, fatigue, fever, this involves a number of treatment options, and is a complaint that carries with it a high risk of complications and morbidity. A differential diagnosis was considered for the patient's symptoms which is discussed below:   Sepsis --in this context most likely related to patient's known pneumonia.  Has not taken any antibiotics today   Co morbidities: Discussed in HPI   Brief History:  Patient is a 57 year old male with past medical history significant for chronic anemia, HTN, homelessness, heavy alcohol use with severe withdrawal symptoms recently detoxed here during hospital stay, thrombocytopenia, GI bleeds  Patient presents emergency room today somnolence and febrile and ill-appearing brought in by EMS for the symptoms above.  He was discharged yesterday for multifocal pneumonia.   He states that he has not had any antibiotics today.  He states that he has become so weak he is unable to get up and move around.  He was  brought in by EMS from [facility] where he is staying.  He states he has not had much of an appetite    EMR reviewed including pt PMHx, past surgical history and past visits to ER.   See HPI for more details   Lab Tests:   I ordered and independently interpreted labs. Labs notable for leukocytosis of 10.4 increased from discharge CMP    Imaging Studies:  Abnormal findings. I personally reviewed all imaging studies. Imaging notable for CT abdomen shows improving pancreatitis  Chest x-ray with continued evidence of pneumonia   Cardiac Monitoring:  The patient was maintained on a cardiac monitor.  I personally viewed and interpreted the cardiac monitored which showed an underlying rhythm of: Sinus tachycardia EKG non-ischemic   Medicines ordered:  I ordered medication including azithromycin, cefepime, lactated Ringer's, Tylenol for fever, sepsis Reevaluation of the patient after these medicines showed that the patient improved I have reviewed the patients home medicines and have made adjustments as needed   Critical Interventions:   treatment of sepsis   Consults/Attending Physician   I discussed this case with my attending physician who cosigned this note including patient's presenting symptoms, physical exam, and planned diagnostics and interventions. Attending physician stated agreement with plan or made changes to plan which were implemented.  Patient admitted to hospital medicine service  Reevaluation:  After the interventions noted above I re-evaluated patient and found that they have :improved   Social Determinants of Health:   patient is homeless and it seems that her issues of medication noncompliance    Problem List / ED Course:  Pneumonia represented to the emergency room meeting SIRS criteria with source being pneumonia.  Somewhat altered mental status initially but improved with Tylenol and IV fluid hydration.   Dispostion:  After  consideration of the diagnostic results and the patients response to treatment, I feel that the patent would benefit from admission.   Final Clinical Impression(s) / ED Diagnoses Final diagnoses:  Sepsis, due to unspecified organism, unspecified whether acute  organ dysfunction present    Rx / DC Orders ED Discharge Orders     None         Gailen Shelter, [place] 06/14/22 1610    Zadie Rhine, MD 06/14/22 [phone]

## 2022-06-13 NOTE — ED Provider Notes (Incomplete)
[place] EMERGENCY DEPARTMENT AT [facility] Provider Note   CSN: [id number] Arrival date & time: 06/13/22  2206     History {Add pertinent medical, surgical, social history, OB history to HPI:1} Chief Complaint  Patient presents with  . Weakness    Pneumonia symptoms     Samuel Blevins is a 57 y.o. male.   Weakness Patient is a 57 year old male with past medical history significant for chronic anemia, HTN, homelessness, heavy alcohol use with severe withdrawal symptoms recently detoxed here during hospital stay, thrombocytopenia, GI bleeds  Patient presents emergency room today somnolence and febrile and ill-appearing brought in by EMS for the symptoms above.  He was discharged yesterday for multifocal pneumonia     Home Medications Prior to Admission medications   Medication Sig Start Date End Date Taking? Authorizing Provider  acetaminophen (TYLENOL) 325 MG tablet Take 2 tablets (650 mg total) by mouth every 6 (six) hours as needed for fever, moderate pain, mild pain or headache. 12/12/20   Katsadouros, Vasilios, MD  amLODipine (NORVASC) 5 MG tablet Take 1 tablet (5 mg total) by mouth daily. 04/18/22   Lyndle Herrlich, MD  amoxicillin-clavulanate (AUGMENTIN) 875-125 MG tablet Take 1 tablet by mouth 2 (two) times daily for 2 days. 06/13/22 06/15/22  Modena Slater, DO  chlordiazePOXIDE (LIBRIUM) 25 MG capsule Take 1 capsule (25 mg total) by mouth 2 (two) times daily for 2 days, THEN 1 capsule (25 mg total) daily for 2 days. 06/12/22 06/16/22  Modena Slater, DO  clobetasol cream (TEMOVATE) 0.05 % Apply 1 Application topically 2 (two) times daily. 03/06/22   [name], MD  folic acid (FOLVITE) 1 MG tablet Take 1 tablet (1 mg total) by mouth daily. 10/17/21   [name], MD  Iron, Ferrous Sulfate, 325 (65 Fe) MG TABS Take 1 tablet ( 325 mg) by mouth daily. 05/24/22   [name], MD  lipase/protease/amylase (CREON) 12000-38000 units CPEP capsule Take 1  capsule (12,000 Units total) by mouth 3 (three) times daily before meals. 04/18/22   Lyndle Herrlich, MD  Multiple Vitamin (MULTIVITAMIN WITH MINERALS) TABS tablet Take 1 tablet by mouth daily. 09/27/21   [name], MD  ondansetron (ZOFRAN) 8 MG tablet Take 1 tablet (8 mg total) by mouth every 6 (six) hours as needed for nausea. 01/23/22   [name], MD  pantoprazole (PROTONIX) 40 MG tablet Take 1 tablet (40 mg total) by mouth 2 (two) times daily before a meal. 04/18/22   Lyndle Herrlich, MD  polyethylene glycol (MIRALAX / GLYCOLAX) 17 g packet Take 17 g by mouth daily as needed for mild constipation or moderate constipation. 10/07/21   Rocky Morel, DO  senna-docusate (SENOKOT-S) 8.6-50 MG tablet Take 1 tablet by mouth at bedtime as needed for mild constipation. 10/07/21   Rocky Morel, DO  sucralfate (CARAFATE) 1 g tablet Take 1 tablet (1 g total) by mouth 4 (four) times daily -  with meals and at bedtime. 12/27/21   [name], MD  thiamine (VITAMIN B1) 100 MG tablet Take 1 tablet (100 mg total) by mouth daily. 10/02/21   [name], MD      Allergies    Patient has no known allergies.    Review of Systems   Review of Systems  Neurological:  Positive for weakness.    Physical Exam Updated Vital Signs BP (!) 158/88 (BP Location: Left Arm)   Pulse (!) 112   Temp (!) 102.2 F (39 C)  Resp 20   SpO2 93%  Physical Exam  ED Results / Procedures / Treatments   Labs (all labs ordered are listed, but only abnormal results are displayed) Labs Reviewed  CBC WITH DIFFERENTIAL/PLATELET - Abnormal; Notable for the following components:      Result Value   RBC 3.26 (*)    Hemoglobin 10.2 (*)    HCT 30.3 (*)    RDW 15.7 (*)    Monocytes Absolute 2.0 (*)    Abs Immature Granulocytes 0.41 (*)    All other components within normal limits  RESP PANEL BY RT-PCR (RSV, FLU A&B, COVID)  RVPGX2  CULTURE, BLOOD (ROUTINE X 2)  CULTURE, BLOOD (ROUTINE X 2)   MRSA NEXT GEN BY PCR, NASAL  ETHANOL  LACTIC ACID, PLASMA  LACTIC ACID, PLASMA  COMPREHENSIVE METABOLIC PANEL  PROTIME-INR  APTT  URINALYSIS, ROUTINE W REFLEX MICROSCOPIC    EKG None  Radiology DG Chest Port 1 View  Result Date: 06/13/2022 CLINICAL DATA:  Questionable sepsis - evaluate for abnormality EXAM: PORTABLE CHEST 1 VIEW COMPARISON:  CT angio chest 06/10/2022, chest x-ray 06/09/2022 FINDINGS: The heart and mediastinal contours are unchanged. Aortic calcification. Persistent right base and right middle lobe consolidation. No pulmonary edema. No pleural effusion. No pneumothorax. No acute osseous abnormality. IMPRESSION: 1. Persistent right base and right middle lobe infection. Followup PA and lateral chest X-ray is recommended in 3-4 weeks following therapy to ensure resolution. 2.  Aortic Atherosclerosis (ICD10-I70.0). Electronically Signed   By: Tish Frederickson M.D.   On: 06/13/2022 22:51    Procedures Procedures  {Document cardiac monitor, telemetry assessment procedure when appropriate:1}  Medications Ordered in ED Medications  acetaminophen (TYLENOL) tablet 1,000 mg (has no administration in time range)  lactated ringers infusion (has no administration in time range)  lactated ringers bolus 1,000 mL (has no administration in time range)    And  lactated ringers bolus 1,000 mL (has no administration in time range)  azithromycin (ZITHROMAX) 500 mg in sodium chloride 0.9 % 250 mL IVPB (has no administration in time range)  ceFEPIme (MAXIPIME) 2 g in sodium chloride 0.9 % 100 mL IVPB (has no administration in time range)    ED Course/ Medical Decision Making/ A&P   {   Click here for ABCD2, HEART and other calculatorsREFRESH Note before signing :1}                          Medical Decision Making Amount and/or Complexity of Data Reviewed Labs: ordered. Radiology: ordered. ECG/medicine tests: ordered.  Risk OTC drugs. Prescription drug  management.   ***  {Document critical care time when appropriate:1} {Document review of labs and clinical decision tools ie heart score, Chads2Vasc2 etc:1}  {Document your independent review of radiology images, and any outside records:1} {Document your discussion with family members, caretakers, and with consultants:1} {Document social determinants of health affecting pt's care:1} {Document your decision making why or why not admission, treatments were needed:1} Final Clinical Impression(s) / ED Diagnoses Final diagnoses:  None    Rx / DC Orders ED Discharge Orders     None

## 2022-06-13 NOTE — ED Triage Notes (Signed)
Pt was discharged yesterday for pneumonia. Pt is still having symptoms, per ems pt is still very weak.

## 2022-06-13 NOTE — Progress Notes (Signed)
Elink monitoring for the code sepsis protocol.  

## 2022-06-13 NOTE — Progress Notes (Signed)
Pharmacy Antibiotic Note  Samuel Blevins is a 57 y.o. male for which pharmacy has been consulted for cefepime dosing for pneumonia.  Patient with a history of ETOH abuse, HTN, thrombocytopenia . Patient presenting with persistent symptoms following recent discharge from pneumonia admission.  SCr 0.66 on 4/17 -- pending for this encounter WBC pending; LA pending; T 102.2; HR 112; RR 20  Plan: Azithomycin per MD Cefepime 2g q8hr --dosed per Scr 4/17, pharmacy to adjust if renal function changes Trend WBC, Fever, Renal function F/u cultures, clinical course, WBC De-escalate when able F/u MRSA PCR     Temp (24hrs), Avg:102.2 F (39 C), Min:102.2 F (39 C), Max:102.2 F (39 C)  Recent Labs  Lab 06/09/22 2026 06/10/22 0335 06/10/22 1043 06/10/22 1048 06/10/22 1145 06/10/22 1632 06/10/22 1757 06/11/22 0215 06/12/22 0217  WBC 10.9*  --   --   --  14.1*  --   --  11.3* 9.8  CREATININE 1.40*  --   --  0.75  --  0.71  --  0.59* 0.66  LATICACIDVEN  --  2.1* 2.3*  --   --   --  1.9  --   --     Estimated Creatinine Clearance: 112.2 mL/min (by C-G formula based on SCr of 0.66 mg/dL).    No Known Allergies  Microbiology results: Pending  Thank you for allowing pharmacy to be a part of this patient's care.  Delmar Landau, PharmD, BCPS 06/13/2022 11:03 PM ED Clinical Pharmacist -  [phone]

## 2022-06-13 NOTE — Transitions of Care (Post Inpatient/ED Visit) (Signed)
   06/13/2022  Name: Samuel Blevins MRN: [id number] DOB: November 29, 1965  Today's TOC FU Call Status: Today's TOC FU Call Status:: Unsuccessul Call (1st Attempt) Unsuccessful Call (1st Attempt) Date: 06/13/22  Attempted to reach the patient regarding the most recent Inpatient/ED visit.  Follow Up Plan: Additional outreach attempts will be made to reach the patient to complete the Transitions of Care (Post Inpatient/ED visit) call.   Signature  Robyne Peers, RN

## 2022-06-14 ENCOUNTER — Emergency Department (HOSPITAL_COMMUNITY): Payer: Medicaid Other

## 2022-06-14 DIAGNOSIS — F10930 Alcohol use, unspecified with withdrawal, uncomplicated: Secondary | ICD-10-CM | POA: Diagnosis not present

## 2022-06-14 DIAGNOSIS — F32A Depression, unspecified: Secondary | ICD-10-CM | POA: Diagnosis present

## 2022-06-14 DIAGNOSIS — Z5900 Homelessness unspecified: Secondary | ICD-10-CM | POA: Diagnosis not present

## 2022-06-14 DIAGNOSIS — K859 Acute pancreatitis without necrosis or infection, unspecified: Secondary | ICD-10-CM | POA: Diagnosis not present

## 2022-06-14 DIAGNOSIS — F10139 Alcohol abuse with withdrawal, unspecified: Secondary | ICD-10-CM | POA: Diagnosis present

## 2022-06-14 DIAGNOSIS — J189 Pneumonia, unspecified organism: Secondary | ICD-10-CM

## 2022-06-14 DIAGNOSIS — E860 Dehydration: Secondary | ICD-10-CM | POA: Diagnosis present

## 2022-06-14 DIAGNOSIS — A419 Sepsis, unspecified organism: Secondary | ICD-10-CM | POA: Diagnosis not present

## 2022-06-14 DIAGNOSIS — E876 Hypokalemia: Secondary | ICD-10-CM | POA: Diagnosis present

## 2022-06-14 DIAGNOSIS — K861 Other chronic pancreatitis: Secondary | ICD-10-CM

## 2022-06-14 DIAGNOSIS — F419 Anxiety disorder, unspecified: Secondary | ICD-10-CM | POA: Diagnosis present

## 2022-06-14 DIAGNOSIS — K860 Alcohol-induced chronic pancreatitis: Secondary | ICD-10-CM | POA: Diagnosis present

## 2022-06-14 DIAGNOSIS — Z79899 Other long term (current) drug therapy: Secondary | ICD-10-CM | POA: Diagnosis not present

## 2022-06-14 DIAGNOSIS — Z1152 Encounter for screening for COVID-19: Secondary | ICD-10-CM | POA: Diagnosis not present

## 2022-06-14 DIAGNOSIS — I10 Essential (primary) hypertension: Secondary | ICD-10-CM | POA: Diagnosis present

## 2022-06-14 DIAGNOSIS — F17210 Nicotine dependence, cigarettes, uncomplicated: Secondary | ICD-10-CM | POA: Diagnosis present

## 2022-06-14 DIAGNOSIS — G9341 Metabolic encephalopathy: Secondary | ICD-10-CM | POA: Diagnosis present

## 2022-06-14 DIAGNOSIS — E871 Hypo-osmolality and hyponatremia: Secondary | ICD-10-CM | POA: Diagnosis present

## 2022-06-14 DIAGNOSIS — K852 Alcohol induced acute pancreatitis without necrosis or infection: Secondary | ICD-10-CM | POA: Diagnosis present

## 2022-06-14 DIAGNOSIS — J690 Pneumonitis due to inhalation of food and vomit: Secondary | ICD-10-CM | POA: Diagnosis present

## 2022-06-14 LAB — ETHANOL: Alcohol, Ethyl (B): <10 mg/dL (ref <10)

## 2022-06-14 LAB — CBC WITH DIFFERENTIAL/PLATELET
Abs Immature Granulocytes: 0.34 10*3/uL — ABNORMAL HIGH (ref 0.00–0.07)
Basophils Absolute: 0.1 10*3/uL (ref 0.0–0.1)
Basophils Relative: 1 %
Eosinophils Absolute: 0 10*3/uL (ref 0.0–0.5)
Eosinophils Relative: 0 %
HCT: 30.9 % — ABNORMAL LOW (ref 39.0–52.0)
Hemoglobin: 10.1 g/dL — ABNORMAL LOW (ref 13.0–17.0)
Immature Granulocytes: 5 %
Lymphocytes Relative: 18 %
Lymphs Abs: 1.3 10*3/uL (ref 0.7–4.0)
MCH: 30.7 pg (ref 26.0–34.0)
MCHC: 32.7 g/dL (ref 30.0–36.0)
MCV: 93.9 fL (ref 80.0–100.0)
Monocytes Absolute: 1.5 10*3/uL — ABNORMAL HIGH (ref 0.1–1.0)
Monocytes Relative: 21 %
Neutro Abs: 4.1 10*3/uL (ref 1.7–7.7)
Neutrophils Relative %: 55 %
Platelets: 267 10*3/uL (ref 150–400)
RBC: 3.29 MIL/uL — ABNORMAL LOW (ref 4.22–5.81)
RDW: 15.9 % — ABNORMAL HIGH (ref 11.5–15.5)
WBC: 7.4 10*3/uL (ref 4.0–10.5)
nRBC: 0 % (ref 0.0–0.2)

## 2022-06-14 LAB — RESP PANEL BY RT-PCR (RSV, FLU A&B, COVID)  RVPGX2
Influenza A by PCR: NEGATIVE
Influenza B by PCR: NEGATIVE
Resp Syncytial Virus by PCR: NEGATIVE
SARS Coronavirus 2 by RT PCR: NEGATIVE

## 2022-06-14 LAB — BASIC METABOLIC PANEL
Anion gap: 14 (ref 5–15)
BUN: 5 mg/dL — ABNORMAL LOW (ref 6–20)
CO2: 20 mmol/L — ABNORMAL LOW (ref 22–32)
Calcium: 8.4 mg/dL — ABNORMAL LOW (ref 8.9–10.3)
Chloride: 99 mmol/L (ref 98–111)
Creatinine, Ser: 0.7 mg/dL (ref 0.61–1.24)
GFR, Estimated: >60 mL/min (ref >60)
Glucose, Bld: 107 mg/dL — ABNORMAL HIGH (ref 70–99)
Potassium: 3.1 mmol/L — ABNORMAL LOW (ref 3.5–5.1)
Sodium: 133 mmol/L — ABNORMAL LOW (ref 135–145)

## 2022-06-14 LAB — URINALYSIS, ROUTINE W REFLEX MICROSCOPIC
Bilirubin Urine: NEGATIVE
Glucose, UA: NEGATIVE mg/dL
Hgb urine dipstick: NEGATIVE
Ketones, ur: 80 mg/dL — AB
Leukocytes,Ua: NEGATIVE
Nitrite: NEGATIVE
Protein, ur: NEGATIVE mg/dL
Specific Gravity, Urine: 1.029 (ref 1.005–1.030)
pH: 6 (ref 5.0–8.0)

## 2022-06-14 LAB — COMPREHENSIVE METABOLIC PANEL
ALT: 24 U/L (ref 0–44)
AST: 43 U/L — ABNORMAL HIGH (ref 15–41)
Albumin: 2.5 g/dL — ABNORMAL LOW (ref 3.5–5.0)
Alkaline Phosphatase: 77 U/L (ref 38–126)
Anion gap: 17 — ABNORMAL HIGH (ref 5–15)
BUN: 9 mg/dL (ref 6–20)
CO2: 18 mmol/L — ABNORMAL LOW (ref 22–32)
Calcium: 8.9 mg/dL (ref 8.9–10.3)
Chloride: 98 mmol/L (ref 98–111)
Creatinine, Ser: 0.9 mg/dL (ref 0.61–1.24)
GFR, Estimated: >60 mL/min (ref >60)
Glucose, Bld: 73 mg/dL (ref 70–99)
Potassium: 3 mmol/L — ABNORMAL LOW (ref 3.5–5.1)
Sodium: 133 mmol/L — ABNORMAL LOW (ref 135–145)
Total Bilirubin: 1.7 mg/dL — ABNORMAL HIGH (ref 0.3–1.2)
Total Protein: 7 g/dL (ref 6.5–8.1)

## 2022-06-14 LAB — PROTIME-INR
INR: 1.1 (ref 0.8–1.2)
Prothrombin Time: 13.9 seconds (ref 11.4–15.2)

## 2022-06-14 LAB — SODIUM, URINE, RANDOM: Sodium, Ur: 105 mmol/L

## 2022-06-14 LAB — CULTURE, BLOOD (ROUTINE X 2)
Special Requests: ADEQUATE
Special Requests: ADEQUATE

## 2022-06-14 LAB — LACTIC ACID, PLASMA: Lactic Acid, Venous: 0.8 mmol/L (ref 0.5–1.9)

## 2022-06-14 LAB — MAGNESIUM: Magnesium: 1.4 mg/dL — ABNORMAL LOW (ref 1.7–2.4)

## 2022-06-14 LAB — APTT: aPTT: 37 seconds — ABNORMAL HIGH (ref 24–36)

## 2022-06-14 LAB — PHOSPHORUS: Phosphorus: 3.3 mg/dL (ref 2.5–4.6)

## 2022-06-14 LAB — LIPASE, BLOOD: Lipase: 35 U/L (ref 11–51)

## 2022-06-14 LAB — PROCALCITONIN: Procalcitonin: 1.02 ng/mL

## 2022-06-14 MED ORDER — CHLORDIAZEPOXIDE HCL 25 MG PO CAPS
25.0000 mg | ORAL_CAPSULE | Freq: Three times a day (TID) | ORAL | Status: AC
  Administered 2022-06-15 (×3): 25 mg via ORAL
  Filled 2022-06-14 (×4): qty 1

## 2022-06-14 MED ORDER — CHLORDIAZEPOXIDE HCL 25 MG PO CAPS
25.0000 mg | ORAL_CAPSULE | Freq: Three times a day (TID) | ORAL | Status: AC
  Administered 2022-06-14 (×3): 25 mg via ORAL
  Filled 2022-06-14 (×3): qty 1

## 2022-06-14 MED ORDER — MAGNESIUM SULFATE 4 GM/100ML IV SOLN
4.0000 g | Freq: Once | INTRAVENOUS | Status: AC
  Administered 2022-06-14: 4 g via INTRAVENOUS
  Filled 2022-06-14: qty 100

## 2022-06-14 MED ORDER — LOPERAMIDE HCL 2 MG PO CAPS: 2.0000 mg | ORAL_CAPSULE | ORAL | Status: AC | PRN

## 2022-06-14 MED ORDER — IOHEXOL 350 MG/ML SOLN
75.0000 mL | Freq: Once | INTRAVENOUS | Status: AC | PRN
  Administered 2022-06-14: 75 mL via INTRAVENOUS

## 2022-06-14 MED ORDER — PANTOPRAZOLE SODIUM 40 MG PO TBEC
40.0000 mg | DELAYED_RELEASE_TABLET | Freq: Two times a day (BID) | ORAL | Status: DC
  Administered 2022-06-14 – 2022-06-17 (×8): 40 mg via ORAL
  Filled 2022-06-14 (×9): qty 1

## 2022-06-14 MED ORDER — THIAMINE MONONITRATE 100 MG PO TABS
100.0000 mg | ORAL_TABLET | Freq: Every day | ORAL | Status: DC
  Administered 2022-06-14 – 2022-06-17 (×4): 100 mg via ORAL
  Filled 2022-06-14 (×5): qty 1

## 2022-06-14 MED ORDER — POTASSIUM CHLORIDE 10 MEQ/100ML IV SOLN
10.0000 meq | INTRAVENOUS | Status: AC
  Administered 2022-06-14 (×5): 10 meq via INTRAVENOUS
  Filled 2022-06-14 (×5): qty 100

## 2022-06-14 MED ORDER — FOLIC ACID 1 MG PO TABS
1.0000 mg | ORAL_TABLET | Freq: Every day | ORAL | Status: DC
  Administered 2022-06-14 – 2022-06-17 (×4): 1 mg via ORAL
  Filled 2022-06-14 (×5): qty 1

## 2022-06-14 MED ORDER — LORAZEPAM 2 MG/ML IJ SOLN: 1.0000 mg | INTRAMUSCULAR | Status: DC | PRN

## 2022-06-14 MED ORDER — CHLORDIAZEPOXIDE HCL 25 MG PO CAPS: 25.0000 mg | ORAL_CAPSULE | ORAL | Status: DC

## 2022-06-14 MED ORDER — THIAMINE HCL 100 MG/ML IJ SOLN
100.0000 mg | Freq: Every day | INTRAMUSCULAR | Status: DC
  Filled 2022-06-14: qty 2

## 2022-06-14 MED ORDER — SODIUM CHLORIDE 0.9 % IV SOLN
500.0000 mg | INTRAVENOUS | Status: DC
  Administered 2022-06-14: 500 mg via INTRAVENOUS
  Filled 2022-06-14: qty 5

## 2022-06-14 MED ORDER — POTASSIUM CHLORIDE 10 MEQ/100ML IV SOLN
10.0000 meq | Freq: Once | INTRAVENOUS | Status: AC
  Administered 2022-06-14: 10 meq via INTRAVENOUS
  Filled 2022-06-14: qty 100

## 2022-06-14 MED ORDER — AMLODIPINE BESYLATE 5 MG PO TABS
5.0000 mg | ORAL_TABLET | Freq: Every day | ORAL | Status: DC
  Administered 2022-06-14 – 2022-06-15 (×2): 5 mg via ORAL
  Filled 2022-06-14 (×2): qty 1

## 2022-06-14 MED ORDER — PANCRELIPASE (LIP-PROT-AMYL) 12000-38000 UNITS PO CPEP
12000.0000 [IU] | ORAL_CAPSULE | Freq: Three times a day (TID) | ORAL | Status: DC
  Administered 2022-06-14 – 2022-06-17 (×11): 12000 [IU] via ORAL
  Filled 2022-06-14 (×16): qty 1

## 2022-06-14 MED ORDER — CHLORDIAZEPOXIDE HCL 25 MG PO CAPS
50.0000 mg | ORAL_CAPSULE | Freq: Once | ORAL | Status: AC
  Administered 2022-06-14: 50 mg via ORAL
  Filled 2022-06-14: qty 2

## 2022-06-14 MED ORDER — LORAZEPAM 1 MG PO TABS
1.0000 mg | ORAL_TABLET | ORAL | Status: DC | PRN
  Administered 2022-06-14: 1 mg via ORAL
  Filled 2022-06-14: qty 1

## 2022-06-14 MED ORDER — SUCRALFATE 1 G PO TABS
1.0000 g | ORAL_TABLET | Freq: Three times a day (TID) | ORAL | Status: DC
  Administered 2022-06-14 – 2022-06-17 (×15): 1 g via ORAL
  Filled 2022-06-14 (×14): qty 1

## 2022-06-14 MED ORDER — HYDROXYZINE HCL 25 MG PO TABS
25.0000 mg | ORAL_TABLET | Freq: Four times a day (QID) | ORAL | Status: DC | PRN
  Administered 2022-06-16: 25 mg via ORAL
  Filled 2022-06-14: qty 1

## 2022-06-14 MED ORDER — CHLORDIAZEPOXIDE HCL 25 MG PO CAPS: 25.0000 mg | ORAL_CAPSULE | Freq: Every day | ORAL | Status: DC

## 2022-06-14 MED ORDER — ADULT MULTIVITAMIN W/MINERALS CH
1.0000 | ORAL_TABLET | Freq: Every day | ORAL | Status: DC
  Administered 2022-06-14 – 2022-06-17 (×4): 1 via ORAL
  Filled 2022-06-14 (×5): qty 1

## 2022-06-14 MED ORDER — ENOXAPARIN SODIUM 40 MG/0.4ML IJ SOSY
40.0000 mg | PREFILLED_SYRINGE | Freq: Every day | INTRAMUSCULAR | Status: DC
  Administered 2022-06-14 – 2022-06-17 (×4): 40 mg via SUBCUTANEOUS
  Filled 2022-06-14 (×4): qty 0.4

## 2022-06-14 NOTE — ED Notes (Signed)
ED TO INPATIENT HANDOFF REPORT  ED Nurse Name and Phone #: [phone]  S Name/Age/Gender Samuel Blevins 57 y.o. male Room/Bed: 046C/046C  Code Status   Code Status: Full Code  Home/SNF/Other Home Patient oriented to: self, place, time, and situation Is this baseline? Yes   Triage Complete: Triage complete  Chief Complaint CAP (community acquired pneumonia) [J18.9]  Triage Note Pt was discharged yesterday for pneumonia. Pt is still having symptoms, per ems pt is still very weak.     Allergies No Known Allergies  Level of Care/Admitting Diagnosis ED Disposition     ED Disposition  Admit   Condition  --   Comment  Hospital [facility]: [facility] [[id number]]  Level of Care: Med-Surg [16]  May admit patient to [facility] or [facility] if equivalent level of care is available:: Yes  Covid Evaluation: Confirmed COVID Negative  Diagnosis: CAP (community acquired pneumonia) [098119]  Admitting Physician: Gery Pray [[id number]]  Attending Physician: Gery Pray [[id number]]  Certification:: I certify this patient will need inpatient services for at least 2 midnights  Estimated Length of Stay: 2          B Medical/Surgery History Past Medical History:  Diagnosis Date   Alcohol withdrawal syndrome with complication    Alcoholism    Elevated AST (SGOT)    Gastrointestinal hemorrhage    Homeless    Hypertension    Pancreatic insufficiency    takes Creon   Symptomatic anemia 11/23/2015   Thrombocytopenia 05/09/2021   Past Surgical History:  Procedure Laterality Date   BIOPSY  06/23/2019   Procedure: BIOPSY;  Surgeon: Shellia Cleverly, DO;  Location: [facility] ENDOSCOPY;  Service: Gastroenterology;;   BIOPSY  12/12/2020   Procedure: BIOPSY;  Surgeon: Lynann Bologna, MD;  Location: [facility] ENDOSCOPY;  Service: Endoscopy;;   BIOPSY  08/04/2021   Procedure: BIOPSY;  Surgeon: Jenel Lucks, MD;  Location: [facility] ENDOSCOPY;  Service: Gastroenterology;;    ESOPHAGOGASTRODUODENOSCOPY N/A 08/04/2021   Procedure: ESOPHAGOGASTRODUODENOSCOPY (EGD);  Surgeon: Jenel Lucks, MD;  Location: [facility];  Service: Gastroenterology;  Laterality: N/A;   ESOPHAGOGASTRODUODENOSCOPY (EGD) WITH PROPOFOL N/A 06/23/2019   Procedure: ESOPHAGOGASTRODUODENOSCOPY (EGD) WITH PROPOFOL;  Surgeon: Shellia Cleverly, DO;  Location: [facility] ENDOSCOPY;  Service: Gastroenterology;  Laterality: N/A;   ESOPHAGOGASTRODUODENOSCOPY (EGD) WITH PROPOFOL N/A 12/12/2020   Procedure: ESOPHAGOGASTRODUODENOSCOPY (EGD) WITH PROPOFOL;  Surgeon: Lynann Bologna, MD;  Location: [facility] ENDOSCOPY;  Service: Endoscopy;  Laterality: N/A;   HOT HEMOSTASIS N/A 06/23/2019   Procedure: HOT HEMOSTASIS (ARGON PLASMA COAGULATION/BICAP);  Surgeon: Shellia Cleverly, DO;  Location: [facility] ENDOSCOPY;  Service: Gastroenterology;  Laterality: N/A;     A IV Location/Drains/Wounds Patient Lines/Drains/Airways Status     Active Line/Drains/Airways     Name Placement date Placement time Site Days   Peripheral IV 06/13/22 20 G 1.16" Left Antecubital 06/13/22  2345  Antecubital  1   Peripheral IV 06/13/22 20 G 1.16" Right Antecubital 06/13/22  2352  Antecubital  1            Intake/Output Last 24 hours  Intake/Output Summary (Last 24 hours) at 06/14/2022 1334 Last data filed at 06/14/2022 0049 Gross per 24 hour  Intake 2250 ml  Output --  Net 2250 ml    Labs/Imaging Results for orders placed or performed during the hospital encounter of 06/13/22 (from the past 48 hour(s))  Ethanol     Status: None   Collection Time: 06/13/22 11:10 PM  Result Value Ref Range   Alcohol,  Ethyl (B) <10 <10 mg/dL    Comment: (NOTE) Lowest detectable limit for serum alcohol is 10 mg/dL.  For medical purposes only. Performed at [facility], [address] [place], [place] [address]   Lactic acid, plasma     Status: None   Collection Time: 06/13/22 11:10 PM  Result Value Ref Range   Lactic Acid, Venous 0.8 0.5  - 1.9 mmol/L    Comment: Performed at [facility], [address], [place], [place] [address]  Comprehensive metabolic panel     Status: Abnormal   Collection Time: 06/13/22 11:10 PM  Result Value Ref Range   Sodium 132 (L) 135 - 145 mmol/L   Potassium 3.4 (L) 3.5 - 5.1 mmol/L   Chloride 95 (L) 98 - 111 mmol/L   CO2 17 (L) 22 - 32 mmol/L   Glucose, Bld 73 70 - 99 mg/dL    Comment: Glucose reference range applies only to samples taken after fasting for at least 8 hours.   BUN 14 6 - 20 mg/dL   Creatinine, Ser 8.65 0.61 - 1.24 mg/dL   Calcium 9.5 8.9 - 78.4 mg/dL   Total Protein 7.9 6.5 - 8.1 g/dL   Albumin 3.0 (L) 3.5 - 5.0 g/dL   AST 51 (H) 15 - 41 U/L   ALT 28 0 - 44 U/L   Alkaline Phosphatase 84 38 - 126 U/L   Total Bilirubin 2.1 (H) 0.3 - 1.2 mg/dL   GFR, Estimated >69 >62 mL/min    Comment: (NOTE) Calculated using the CKD-EPI Creatinine Equation (2021)    Anion gap 20 (H) 5 - 15    Comment: Performed at [facility], [address] [place], [place] [address]  CBC with Differential     Status: Abnormal   Collection Time: 06/13/22 11:10 PM  Result Value Ref Range   WBC 10.4 4.0 - 10.5 K/uL   RBC 3.26 (L) 4.22 - 5.81 MIL/uL   Hemoglobin 10.2 (L) 13.0 - 17.0 g/dL   HCT 13.2 (L) 44.0 - 10.2 %   MCV 92.9 80.0 - 100.0 fL   MCH 31.3 26.0 - 34.0 pg   MCHC 33.7 30.0 - 36.0 g/dL   RDW 72.5 (H) 36.6 - 44.0 %   Platelets 272 150 - 400 K/uL   nRBC 0.0 0.0 - 0.2 %   Neutrophils Relative % 63 %   Neutro Abs 6.6 1.7 - 7.7 K/uL   Lymphocytes Relative 12 %   Lymphs Abs 1.2 0.7 - 4.0 K/uL   Monocytes Relative 20 %   Monocytes Absolute 2.0 (H) 0.1 - 1.0 K/uL   Eosinophils Relative 0 %   Eosinophils Absolute 0.0 0.0 - 0.5 K/uL   Basophils Relative 1 %   Basophils Absolute 0.1 0.0 - 0.1 K/uL   Immature Granulocytes 4 %   Abs Immature Granulocytes 0.41 (H) 0.00 - 0.07 K/uL    Comment: Performed at [facility], [address] [place], [place] [address]   Protime-INR     Status: None   Collection Time: 06/13/22 11:10 PM  Result Value Ref Range   Prothrombin Time 13.9 11.4 - 15.2 seconds   INR 1.1 0.8 - 1.2    Comment: (NOTE) INR goal varies based on device and disease states. Performed at [facility], [address] [place], [place] [address]   APTT     Status: Abnormal   Collection Time: 06/13/22 11:10 PM  Result Value Ref  Range   aPTT 37 (H) 24 - 36 seconds    Comment:        IF BASELINE aPTT IS ELEVATED, SUGGEST PATIENT RISK ASSESSMENT BE USED TO DETERMINE APPROPRIATE ANTICOAGULANT THERAPY. Performed at [facility], [address] [place], [place] [address]   Blood Culture (routine x 2)     Status: None (Preliminary result)   Collection Time: 06/13/22 11:10 PM   Specimen: BLOOD  Result Value Ref Range   Specimen Description BLOOD SITE NOT SPECIFIED    Special Requests      BOTTLES DRAWN AEROBIC AND ANAEROBIC Blood Culture adequate volume   Culture      NO GROWTH < 12 HOURS Performed at [facility], [address] [place], [place] [address]    Report Status PENDING   Blood Culture (routine x 2)     Status: None (Preliminary result)   Collection Time: 06/13/22 11:10 PM   Specimen: BLOOD LEFT FOREARM  Result Value Ref Range   Specimen Description BLOOD LEFT FOREARM    Special Requests      BOTTLES DRAWN AEROBIC AND ANAEROBIC Blood Culture adequate volume   Culture      NO GROWTH < 12 HOURS Performed at [facility], [address] [place], [place] [address]    Report Status PENDING   Resp panel by RT-PCR (RSV, Flu A&B, Covid) Anterior Nasal Swab     Status: None   Collection Time: 06/14/22 12:54 AM   Specimen: Anterior Nasal Swab  Result Value Ref Range   SARS Coronavirus 2 by RT PCR NEGATIVE NEGATIVE   Influenza A by PCR NEGATIVE NEGATIVE   Influenza B by PCR NEGATIVE NEGATIVE    Comment: (NOTE) The Xpert Xpress SARS-CoV-2/FLU/RSV plus assay is intended as an aid in the diagnosis  of influenza from Nasopharyngeal swab specimens and should not be used as a sole basis for treatment. Nasal washings and aspirates are unacceptable for Xpert Xpress SARS-CoV-2/FLU/RSV testing.  Fact Sheet for Patients: BloggerCourse.com  Fact Sheet for Healthcare Providers: SeriousBroker.it  This test is not yet approved or cleared by the [place] FDA and has been authorized for detection and/or diagnosis of SARS-CoV-2 by FDA under an Emergency Use Authorization (EUA). This EUA will remain in effect (meaning this test can be used) for the duration of the COVID-19 declaration under Section 564(b)(1) of the Act, 21 U.S.C. section 360bbb-3(b)(1), unless the authorization is terminated or revoked.     Resp Syncytial Virus by PCR NEGATIVE NEGATIVE    Comment: (NOTE) Fact Sheet for Patients: BloggerCourse.com  Fact Sheet for Healthcare Providers: SeriousBroker.it  This test is not yet approved or cleared by the [place] FDA and has been authorized for detection and/or diagnosis of SARS-CoV-2 by FDA under an Emergency Use Authorization (EUA). This EUA will remain in effect (meaning this test can be used) for the duration of the COVID-19 declaration under Section 564(b)(1) of the Act, 21 U.S.C. section 360bbb-3(b)(1), unless the authorization is terminated or revoked.  Performed at [facility], [address], [place], [place] [address]   Lipase, blood     Status: None   Collection Time: 06/14/22  4:06 AM  Result Value Ref Range   Lipase 35 11 - 51 U/L    Comment: Performed at [facility], [address], [place], [place] [address]  CBC with Differential/Platelet     Status: Abnormal   Collection Time: 06/14/22  4:06 AM  Result Value Ref Range   WBC 7.4 4.0 - 10.5 K/uL   RBC 3.29 (L) 4.22 - 5.81 MIL/uL   Hemoglobin 10.1 (L) 13.0 - 17.0 g/dL   HCT 16.1 (L)  09.6 - 52.0 %   MCV 93.9 80.0 - 100.0 fL   MCH 30.7 26.0 - 34.0 pg   MCHC 32.7 30.0 - 36.0 g/dL   RDW 04.5 (H) 40.9 - 81.1 %   Platelets 267 150 - 400 K/uL   nRBC 0.0 0.0 - 0.2 %   Neutrophils Relative % 55 %   Neutro Abs 4.1 1.7 - 7.7 K/uL   Lymphocytes Relative 18 %   Lymphs Abs 1.3 0.7 - 4.0 K/uL   Monocytes Relative 21 %   Monocytes Absolute 1.5 (H) 0.1 - 1.0 K/uL   Eosinophils Relative 0 %   Eosinophils Absolute 0.0 0.0 - 0.5 K/uL   Basophils Relative 1 %   Basophils Absolute 0.1 0.0 - 0.1 K/uL   WBC Morphology Mild Left Shift (1-5% metas, occ myelo)    Immature Granulocytes 5 %   Abs Immature Granulocytes 0.34 (H) 0.00 - 0.07 K/uL   Polychromasia PRESENT     Comment: Performed at [facility], [address] [place], [place] [address]  Comprehensive metabolic panel     Status: Abnormal   Collection Time: 06/14/22  4:06 AM  Result Value Ref Range   Sodium 133 (L) 135 - 145 mmol/L   Potassium 3.0 (L) 3.5 - 5.1 mmol/L   Chloride 98 98 - 111 mmol/L   CO2 18 (L) 22 - 32 mmol/L   Glucose, Bld 73 70 - 99 mg/dL    Comment: Glucose reference range applies only to samples taken after fasting for at least 8 hours.   BUN 9 6 - 20 mg/dL   Creatinine, Ser 2.95 0.61 - 1.24 mg/dL   Calcium 8.9 8.9 - 62.1 mg/dL   Total Protein 7.0 6.5 - 8.1 g/dL   Albumin 2.5 (L) 3.5 - 5.0 g/dL   AST 43 (H) 15 - 41 U/L   ALT 24 0 - 44 U/L   Alkaline Phosphatase 77 38 - 126 U/L   Total Bilirubin 1.7 (H) 0.3 - 1.2 mg/dL   GFR, Estimated >30 >86 mL/min    Comment: (NOTE) Calculated using the CKD-EPI Creatinine Equation (2021)    Anion gap 17 (H) 5 - 15    Comment: Performed at [facility], [address] [place], [place] [address]  Magnesium     Status: Abnormal   Collection Time: 06/14/22  4:06 AM  Result Value Ref Range   Magnesium 1.4 (L) 1.7 - 2.4 mg/dL    Comment: Performed at [facility], [address], [place], [place] [address]  Phosphorus     Status: None    Collection Time: 06/14/22  4:06 AM  Result Value Ref Range   Phosphorus 3.3 2.5 - 4.6 mg/dL    Comment: Performed at [facility], [address] [place], [place] [address]  Procalcitonin     Status: None   Collection Time: 06/14/22  4:06 AM  Result Value Ref Range   Procalcitonin 1.02 ng/mL    Comment:        Interpretation: PCT > 0.5 ng/mL and <= 2 ng/mL: Systemic infection (sepsis) is possible, but other conditions are known to elevate PCT as well. (NOTE)       Sepsis PCT Algorithm  Lower Respiratory Tract                                      Infection PCT Algorithm    ----------------------------     ----------------------------         PCT < 0.25 ng/mL                PCT < 0.10 ng/mL          Strongly encourage             Strongly discourage   discontinuation of antibiotics    initiation of antibiotics    ----------------------------     -----------------------------       PCT 0.25 - 0.50 ng/mL            PCT 0.10 - 0.25 ng/mL               OR       >80% decrease in PCT            Discourage initiation of                                            antibiotics      Encourage discontinuation           of antibiotics    ----------------------------     -----------------------------         PCT >= 0.50 ng/mL              PCT 0.26 - 0.50 ng/mL                AND       <80% decrease in PCT             Encourage initiation of                                             antibiotics       Encourage continuation           of antibiotics    ----------------------------     -----------------------------        PCT >= 0.50 ng/mL                  PCT > 0.50 ng/mL               AND         increase in PCT                  Strongly encourage                                      initiation of antibiotics    Strongly encourage escalation           of antibiotics                                     -----------------------------  PCT <= 0.25 ng/mL                                                 OR                                        > 80% decrease in PCT                                      Discontinue / Do not initiate                                             antibiotics  Performed at [facility], [address] [place], [place] [address]   Urinalysis, Routine w reflex microscopic -Urine, Clean Catch     Status: Abnormal   Collection Time: 06/14/22  5:27 AM  Result Value Ref Range   Color, Urine YELLOW YELLOW   APPearance CLEAR CLEAR   Specific Gravity, Urine 1.029 1.005 - 1.030   pH 6.0 5.0 - 8.0   Glucose, UA NEGATIVE NEGATIVE mg/dL   Hgb urine dipstick NEGATIVE NEGATIVE   Bilirubin Urine NEGATIVE NEGATIVE   Ketones, ur 80 (A) NEGATIVE mg/dL   Protein, ur NEGATIVE NEGATIVE mg/dL   Nitrite NEGATIVE NEGATIVE   Leukocytes,Ua NEGATIVE NEGATIVE    Comment: Performed at [facility], [address] [place], [place] [address]   CT ABDOMEN PELVIS W CONTRAST  Result Date: 06/14/2022 CLINICAL DATA:  Discharged yesterday for pneumonia. Very weak. Abdominal pain with some diffuse tenderness to palpation and right upper quadrant pain EXAM: CT ABDOMEN AND PELVIS WITH CONTRAST TECHNIQUE: Multidetector CT imaging of the abdomen and pelvis was performed using the standard protocol following bolus administration of intravenous contrast. RADIATION DOSE REDUCTION: This exam was performed according to the departmental dose-optimization program which includes automated exposure control, adjustment of the mA and/or kV according to patient size and/or use of iterative reconstruction technique. CONTRAST:  75mL OMNIPAQUE IOHEXOL 350 MG/ML SOLN COMPARISON:  CT chest abdomen and pelvis 06/10/2022 FINDINGS: Lower chest: Bilateral airspace opacities in the lower lungs compatible with multifocal pneumonia. Hepatobiliary: Hepatic steatosis. The gallbladder is decompressed. No biliary dilation. Pancreas: Similar to  slightly decreased fluid and inflammatory changes along the pancreatico duodenal groove (series 3/image 37-40). No organized fluid collection. No pancreatic necrosis the portal vein is patent. Spleen: Unremarkable. Adrenals/Urinary Tract: Normal adrenal glands. No urinary calculi or hydronephrosis. Nondistended bladder. Stomach/Bowel: Normal caliber large and small bowel. Normal appendix. Mild wall thickening of the duodenum in the region of the pancreatic head. Stomach is within normal limits. Vascular/Lymphatic: Aortic atherosclerotic calcification. No lymphadenopathy. Reproductive: Unremarkable. Other: No free intraperitoneal air. Musculoskeletal: No acute osseous abnormality. IMPRESSION: 1. Improving groove pancreatitis. 2. Multifocal pneumonia. 3. Hepatic steatosis. Aortic Atherosclerosis (ICD10-I70.0). Electronically Signed   By: Minerva Fester M.D.   On: 06/14/2022 01:27   DG Chest Port 1 View  Result Date: 06/13/2022 CLINICAL DATA:  Questionable sepsis - evaluate for abnormality EXAM: PORTABLE CHEST 1 VIEW COMPARISON:  CT angio chest 06/10/2022, chest  x-ray 06/09/2022 FINDINGS: The heart and mediastinal contours are unchanged. Aortic calcification. Persistent right base and right middle lobe consolidation. No pulmonary edema. No pleural effusion. No pneumothorax. No acute osseous abnormality. IMPRESSION: 1. Persistent right base and right middle lobe infection. Followup PA and lateral chest X-ray is recommended in 3-4 weeks following therapy to ensure resolution. 2.  Aortic Atherosclerosis (ICD10-I70.0). Electronically Signed   By: Tish Frederickson M.D.   On: 06/13/2022 22:51    Pending Labs Unresulted Labs (From admission, onward)     Start     Ordered   06/15/22 0500  Basic metabolic panel  Daily,   R      06/14/22 0743   06/15/22 0500  CBC  Daily,   R      06/14/22 0743   06/15/22 0500  Magnesium  Tomorrow morning,   R        06/14/22 0743   06/15/22 0500  Renal function panel  Tomorrow  morning,   R        06/14/22 0927   06/15/22 0500  Magnesium  Tomorrow morning,   R        06/14/22 0927   06/15/22 0500  CBC with Differential/Platelet  Tomorrow morning,   R        06/14/22 0927   06/14/22 0928  Sodium, urine, random  Once,   R        06/14/22 0927   06/14/22 0928  Basic metabolic panel  Once,   R        06/14/22 0927   06/13/22 2300  MRSA Next Gen by PCR, Nasal  Once,   URGENT        06/13/22 2300            Vitals/Pain Today's Vitals   06/14/22 0800 06/14/22 0800 06/14/22 0839 06/14/22 1151  BP:    (!) 153/91  Pulse:    94  Resp:    19  Temp:    98.4 F (36.9 C)  TempSrc:    Oral  SpO2:   98% 96%  PainSc: 0-No pain 0-No pain      Isolation Precautions No active isolations  Medications Medications  lactated ringers infusion ( Intravenous New Bag/Given 06/14/22 0956)  ceFEPIme (MAXIPIME) 2 g in sodium chloride 0.9 % 100 mL IVPB (0 g Intravenous Stopped 06/14/22 0820)  amLODipine (NORVASC) tablet 5 mg (5 mg Oral Given 06/14/22 1320)  lipase/protease/amylase (CREON) capsule 12,000 Units (12,000 Units Oral Given 06/14/22 1332)  pantoprazole (PROTONIX) EC tablet 40 mg (40 mg Oral Given 06/14/22 1321)  sucralfate (CARAFATE) tablet 1 g (1 g Oral Given 06/14/22 1320)  LORazepam (ATIVAN) tablet 1-4 mg (1 mg Oral Given 06/14/22 0423)    Or  LORazepam (ATIVAN) injection 1-4 mg ( Intravenous See Alternative 06/14/22 0423)  thiamine (VITAMIN B1) tablet 100 mg (100 mg Oral Given 06/14/22 0415)    Or  thiamine (VITAMIN B1) injection 100 mg ( Intravenous See Alternative 06/14/22 0415)  folic acid (FOLVITE) tablet 1 mg (1 mg Oral Given 06/14/22 0443)  multivitamin with minerals tablet 1 tablet (1 tablet Oral Given 06/14/22 0415)  hydrOXYzine (ATARAX) tablet 25 mg (has no administration in time range)  loperamide (IMODIUM) capsule 2-4 mg (has no administration in time range)  chlordiazePOXIDE (LIBRIUM) capsule 25 mg (25 mg Oral Given 06/14/22 1332)    Followed by   chlordiazePOXIDE (LIBRIUM) capsule 25 mg (has no administration in time range)    Followed by  chlordiazePOXIDE (LIBRIUM)  capsule 25 mg (has no administration in time range)    Followed by  chlordiazePOXIDE (LIBRIUM) capsule 25 mg (has no administration in time range)  enoxaparin (LOVENOX) injection 40 mg (40 mg Subcutaneous Given 06/14/22 0949)  azithromycin (ZITHROMAX) 500 mg in sodium chloride 0.9 % 250 mL IVPB (has no administration in time range)  potassium chloride 10 mEq in 100 mL IVPB (10 mEq Intravenous New Bag/Given 06/14/22 1316)  acetaminophen (TYLENOL) tablet 1,000 mg (1,000 mg Oral Given 06/13/22 2344)  lactated ringers bolus 1,000 mL (0 mLs Intravenous Stopped 06/14/22 0049)    And  lactated ringers bolus 1,000 mL (0 mLs Intravenous Stopped 06/14/22 0049)  azithromycin (ZITHROMAX) 500 mg in sodium chloride 0.9 % 250 mL IVPB (0 mg Intravenous Stopped 06/14/22 0042)  iohexol (OMNIPAQUE) 350 MG/ML injection 75 mL (75 mLs Intravenous Contrast Given 06/14/22 0118)  chlordiazePOXIDE (LIBRIUM) capsule 50 mg (50 mg Oral Given 06/14/22 0415)  magnesium sulfate IVPB 4 g 100 mL (0 g Intravenous Stopped 06/14/22 1038)    Mobility walks     Focused Assessments     R Recommendations: See Admitting Provider Note  Report given to:   Additional Notes: potassium is 3.0. he suppose to get 6 runs. He had 4, so he need 2 more runs after the one running

## 2022-06-14 NOTE — H&P (Addendum)
PCP:   [name], MD   Chief Complaint:  Altered mentation  HPI: This is a 57 year old male with past medical history of alcohol abuse, homelessness, chronic abd pain, chronic pancreatitis.  Patient recently admitted to the ICU 05/08/2041/70/24 with diagnosis of CAP with acute respiratory failure, on echo pancreatitis and alcohol withdrawal.  He received a total of 3 days IV azithromycin and Rocephin, discharged with 2 days of p.o. Augmentin.  He was treated with CIWA protocol, discharged on Librium taper.    He represents to [facility] ER with fever, confusion and tremulousness.  He states has not had time to fill his antibiotics.  Patient states that he did not feel ill prior to presenting to the ER, however, someone (from [employer]) insisted that he go to the ER and called 911.  During my interview patient's mentation improved, he was afebrile.  He had received Librium, Ativan, Tylenol, and antibiotics.  Patient seems inclined to leave against AMA.  He has been advised that admission is highly recommended On presentation to the ER, BP 150/88, HR 112, RR up to 27,  Tmax 102.2.  Code sepsis initiated  Review of Systems:  The patient denies anorexia, fever, weight loss,, vision loss, decreased hearing, hoarseness, chest pain, syncope, dyspnea on exertion, peripheral edema, balance deficits, hemoptysis, abdominal pain, melena, hematochezia, severe indigestion/heartburn, hematuria, incontinence, genital sores, muscle weakness, suspicious skin lesions, transient blindness, difficulty walking, depression, unusual weight change, abnormal bleeding, enlarged lymph nodes, angioedema, and breast masses. Positive: Confusion, fever, tremulousness, likely alcohol withdrawal  Past Medical History: Past Medical History:  Diagnosis Date   Alcohol withdrawal syndrome with complication    Alcoholism    Elevated AST (SGOT)    Gastrointestinal hemorrhage    Homeless    Hypertension    Pancreatic  insufficiency    takes Creon   Symptomatic anemia 11/23/2015   Thrombocytopenia 05/09/2021   Past Surgical History:  Procedure Laterality Date   BIOPSY  06/23/2019   Procedure: BIOPSY;  Surgeon: Shellia Cleverly, DO;  Location: [facility] ENDOSCOPY;  Service: Gastroenterology;;   BIOPSY  12/12/2020   Procedure: BIOPSY;  Surgeon: Lynann Bologna, MD;  Location: [facility] ENDOSCOPY;  Service: Endoscopy;;   BIOPSY  08/04/2021   Procedure: BIOPSY;  Surgeon: Jenel Lucks, MD;  Location: [facility] ENDOSCOPY;  Service: Gastroenterology;;   ESOPHAGOGASTRODUODENOSCOPY N/A 08/04/2021   Procedure: ESOPHAGOGASTRODUODENOSCOPY (EGD);  Surgeon: Jenel Lucks, MD;  Location: [facility] ENDOSCOPY;  Service: Gastroenterology;  Laterality: N/A;   ESOPHAGOGASTRODUODENOSCOPY (EGD) WITH PROPOFOL N/A 06/23/2019   Procedure: ESOPHAGOGASTRODUODENOSCOPY (EGD) WITH PROPOFOL;  Surgeon: Shellia Cleverly, DO;  Location: [facility] ENDOSCOPY;  Service: Gastroenterology;  Laterality: N/A;   ESOPHAGOGASTRODUODENOSCOPY (EGD) WITH PROPOFOL N/A 12/12/2020   Procedure: ESOPHAGOGASTRODUODENOSCOPY (EGD) WITH PROPOFOL;  Surgeon: Lynann Bologna, MD;  Location: [facility] ENDOSCOPY;  Service: Endoscopy;  Laterality: N/A;   HOT HEMOSTASIS N/A 06/23/2019   Procedure: HOT HEMOSTASIS (ARGON PLASMA COAGULATION/BICAP);  Surgeon: Shellia Cleverly, DO;  Location: [facility] ENDOSCOPY;  Service: Gastroenterology;  Laterality: N/A;    Medications: Prior to Admission medications   Medication Sig Start Date End Date Taking? Authorizing Provider  acetaminophen (TYLENOL) 325 MG tablet Take 2 tablets (650 mg total) by mouth every 6 (six) hours as needed for fever, moderate pain, mild pain or headache. 12/12/20   Katsadouros, Vasilios, MD  amLODipine (NORVASC) 5 MG tablet Take 1 tablet (5 mg total) by mouth daily. 04/18/22   Lyndle Herrlich, MD  amoxicillin-clavulanate (AUGMENTIN) 875-125 MG tablet Take 1 tablet by mouth  2 (two) times daily for 2 days. 06/13/22 06/15/22  Modena Slater,  DO  chlordiazePOXIDE (LIBRIUM) 25 MG capsule Take 1 capsule (25 mg total) by mouth 2 (two) times daily for 2 days, THEN 1 capsule (25 mg total) daily for 2 days. 06/12/22 06/16/22  Modena Slater, DO  clobetasol cream (TEMOVATE) 0.05 % Apply 1 Application topically 2 (two) times daily. 03/06/22   [name], MD  folic acid (FOLVITE) 1 MG tablet Take 1 tablet (1 mg total) by mouth daily. 10/17/21   [name], MD  Iron, Ferrous Sulfate, 325 (65 Fe) MG TABS Take 1 tablet ( 325 mg) by mouth daily. 05/24/22   [name], MD  lipase/protease/amylase (CREON) 12000-38000 units CPEP capsule Take 1 capsule (12,000 Units total) by mouth 3 (three) times daily before meals. 04/18/22   Lyndle Herrlich, MD  Multiple Vitamin (MULTIVITAMIN WITH MINERALS) TABS tablet Take 1 tablet by mouth daily. 09/27/21   [name], MD  ondansetron (ZOFRAN) 8 MG tablet Take 1 tablet (8 mg total) by mouth every 6 (six) hours as needed for nausea. 01/23/22   [name], MD  pantoprazole (PROTONIX) 40 MG tablet Take 1 tablet (40 mg total) by mouth 2 (two) times daily before a meal. 04/18/22   Lyndle Herrlich, MD  polyethylene glycol (MIRALAX / GLYCOLAX) 17 g packet Take 17 g by mouth daily as needed for mild constipation or moderate constipation. 10/07/21   Rocky Morel, DO  senna-docusate (SENOKOT-S) 8.6-50 MG tablet Take 1 tablet by mouth at bedtime as needed for mild constipation. 10/07/21   Rocky Morel, DO  sucralfate (CARAFATE) 1 g tablet Take 1 tablet (1 g total) by mouth 4 (four) times daily -  with meals and at bedtime. 12/27/21   [name], MD  thiamine (VITAMIN B1) 100 MG tablet Take 1 tablet (100 mg total) by mouth daily. 10/02/21   [name], MD    Allergies:  No Known Allergies  Social History:  reports that he has been smoking cigarettes. He has a 15.00 pack-year smoking history. He has never used smokeless tobacco. He reports current alcohol use of about 28.0  standard drinks of alcohol per week. He reports that he does not use drugs.  Family History: Family History  Problem Relation Age of Onset   Diabetes Mellitus II Neg Hx    Colon cancer Neg Hx    Stomach cancer Neg Hx    Pancreatic cancer Neg Hx     Physical Exam: Vitals:   06/14/22 0045 06/14/22 0130 06/14/22 0300 06/14/22 0315  BP: (!) 143/94  (!) 149/90 (!) 147/89  Pulse:  99 94 93  Resp: (!) 29 19 (!) 24 (!) 21  Temp:      SpO2:  100% 100% 98%    General:  Alert and oriented times three, no acute distress.  Disheveled, poor dentition, weak, chronically ill-appearing Eyes: PERRLA, pink conjunctiva, no scleral icterus ENT: Dry oral mucosa, neck supple, no thyromegaly Lungs: clear to ascultation, no wheeze, no crackles, no use of accessory muscles Cardiovascular: regular rate and rhythm, no regurgitation, no gallops, no murmurs. No carotid bruits, no JVD Abdomen: soft, positive BS, non-tender, non-distended, no organomegaly, not an acute abdomen GU: not examined Neuro: CN II - XII grossly intact, sensation intact Musculoskeletal: strength 5/5 all extremities, no clubbing, cyanosis or edema Skin: no rash, no subcutaneous crepitation, no decubitus Psych: appropriate patient   Labs on Admission:  Recent Labs    06/12/22  0217 06/13/22 2310  NA 131* 132*  K 3.5 3.4*  CL 98 95*  CO2 23 17*  GLUCOSE 94 73  BUN <5* 14  CREATININE 0.66 1.01  CALCIUM 8.8* 9.5   Recent Labs    06/13/22 2310  AST 51*  ALT 28  ALKPHOS 84  BILITOT 2.1*  PROT 7.9  ALBUMIN 3.0*    Recent Labs    06/12/22 0217 06/13/22 2310  WBC 9.8 10.4  NEUTROABS  --  6.6  HGB 10.9* 10.2*  HCT 31.6* 30.3*  MCV 91.9 92.9  PLT 165 272     Micro Results: Recent Results (from the past 240 hour(s))  Culture, blood (Routine X 2) w Reflex to ID Panel     Status: None (Preliminary result)   Collection Time: 06/10/22  9:30 AM   Specimen: BLOOD  Result Value Ref Range Status   Specimen Description  BLOOD RIGHT ANTECUBITAL  Final   Special Requests   Final    BOTTLES DRAWN AEROBIC AND ANAEROBIC Blood Culture adequate volume   Culture   Final    NO GROWTH 3 DAYS Performed at [facility], [address], [place], [place] [address]    Report Status PENDING  Incomplete  SARS Coronavirus 2 by RT PCR (hospital order, performed in [facility] hospital lab) *cepheid single result test* Peripheral     Status: None   Collection Time: 06/10/22  3:27 PM   Specimen: Peripheral; Nasal Swab  Result Value Ref Range Status   SARS Coronavirus 2 by RT PCR NEGATIVE NEGATIVE Final    Comment: Performed at [facility], [address], [place], [place] [address]  Respiratory (~20 pathogens) panel by PCR     Status: None   Collection Time: 06/10/22  3:27 PM   Specimen: Nasopharyngeal Swab; Respiratory  Result Value Ref Range Status   Adenovirus NOT DETECTED NOT DETECTED Final   Coronavirus 229E NOT DETECTED NOT DETECTED Final    Comment: (NOTE) The Coronavirus on the Respiratory Panel, DOES NOT test for the novel  Coronavirus (2019 nCoV)    Coronavirus HKU1 NOT DETECTED NOT DETECTED Final   Coronavirus NL63 NOT DETECTED NOT DETECTED Final   Coronavirus OC43 NOT DETECTED NOT DETECTED Final   Metapneumovirus NOT DETECTED NOT DETECTED Final   Rhinovirus / Enterovirus NOT DETECTED NOT DETECTED Final   Influenza A NOT DETECTED NOT DETECTED Final   Influenza B NOT DETECTED NOT DETECTED Final   Parainfluenza Virus 1 NOT DETECTED NOT DETECTED Final   Parainfluenza Virus 2 NOT DETECTED NOT DETECTED Final   Parainfluenza Virus 3 NOT DETECTED NOT DETECTED Final   Parainfluenza Virus 4 NOT DETECTED NOT DETECTED Final   Respiratory Syncytial Virus NOT DETECTED NOT DETECTED Final   Bordetella pertussis NOT DETECTED NOT DETECTED Final   Bordetella Parapertussis NOT DETECTED NOT DETECTED Final   Chlamydophila pneumoniae NOT DETECTED NOT DETECTED Final   Mycoplasma pneumoniae NOT DETECTED NOT  DETECTED Final    Comment: Performed at [facility], [address], [place], [place] [address]  Culture, blood (Routine X 2) w Reflex to ID Panel     Status: None (Preliminary result)   Collection Time: 06/10/22  4:32 PM   Specimen: BLOOD LEFT ARM  Result Value Ref Range Status   Specimen Description BLOOD LEFT ARM  Final   Special Requests   Final    BOTTLES DRAWN AEROBIC AND ANAEROBIC Blood Culture adequate volume   Culture   Final  NO GROWTH 3 DAYS Performed at [facility], [address], [place], [place] [address]    Report Status PENDING  Incomplete  MRSA Next Gen by PCR, Nasal     Status: None   Collection Time: 06/10/22  5:30 PM   Specimen: Nasal Mucosa; Nasal Swab  Result Value Ref Range Status   MRSA by PCR Next Gen NOT DETECTED NOT DETECTED Final    Comment: (NOTE) The GeneXpert MRSA Assay (FDA approved for NASAL specimens only), is one component of a comprehensive MRSA colonization surveillance program. It is not intended to diagnose MRSA infection nor to guide or monitor treatment for MRSA infections. Test performance is not FDA approved in patients less than 43 years old. Performed at [facility], [address], [place], [place] [address]   Urine Culture (for pregnant, neutropenic or urologic patients or patients with an indwelling urinary catheter)     Status: None   Collection Time: 06/10/22  6:14 PM   Specimen: Urine, Clean Catch  Result Value Ref Range Status   Specimen Description URINE, CLEAN CATCH  Final   Special Requests NONE  Final   Culture   Final    NO GROWTH Performed at [facility], [address], [place], [place] [address]    Report Status 06/11/2022 FINAL  Final  Resp panel by RT-PCR (RSV, Flu A&B, Covid) Anterior Nasal Swab     Status: None   Collection Time: 06/14/22 12:54 AM   Specimen: Anterior Nasal Swab  Result Value Ref Range Status   SARS Coronavirus 2 by RT PCR NEGATIVE NEGATIVE Final   Influenza A by  PCR NEGATIVE NEGATIVE Final   Influenza B by PCR NEGATIVE NEGATIVE Final    Comment: (NOTE) The Xpert Xpress SARS-CoV-2/FLU/RSV plus assay is intended as an aid in the diagnosis of influenza from Nasopharyngeal swab specimens and should not be used as a sole basis for treatment. Nasal washings and aspirates are unacceptable for Xpert Xpress SARS-CoV-2/FLU/RSV testing.  Fact Sheet for Patients: BloggerCourse.com  Fact Sheet for Healthcare Providers: SeriousBroker.it  This test is not yet approved or cleared by the [place] FDA and has been authorized for detection and/or diagnosis of SARS-CoV-2 by FDA under an Emergency Use Authorization (EUA). This EUA will remain in effect (meaning this test can be used) for the duration of the COVID-19 declaration under Section 564(b)(1) of the Act, 21 U.S.C. section 360bbb-3(b)(1), unless the authorization is terminated or revoked.     Resp Syncytial Virus by PCR NEGATIVE NEGATIVE Final    Comment: (NOTE) Fact Sheet for Patients: BloggerCourse.com  Fact Sheet for Healthcare Providers: SeriousBroker.it  This test is not yet approved or cleared by the [place] FDA and has been authorized for detection and/or diagnosis of SARS-CoV-2 by FDA under an Emergency Use Authorization (EUA). This EUA will remain in effect (meaning this test can be used) for the duration of the COVID-19 declaration under Section 564(b)(1) of the Act, 21 U.S.C. section 360bbb-3(b)(1), unless the authorization is terminated or revoked.  Performed at [facility], [address] [place], [place] [address]      Radiological Exams on Admission: CT ABDOMEN PELVIS W CONTRAST  Result Date: 06/14/2022 CLINICAL DATA:  Discharged yesterday for pneumonia. Very weak. Abdominal pain with some diffuse tenderness to palpation and right upper quadrant pain EXAM: CT  ABDOMEN AND PELVIS WITH CONTRAST TECHNIQUE: Multidetector CT imaging of the abdomen and pelvis was performed using the standard protocol  following bolus administration of intravenous contrast. RADIATION DOSE REDUCTION: This exam was performed according to the departmental dose-optimization program which includes automated exposure control, adjustment of the mA and/or kV according to patient size and/or use of iterative reconstruction technique. CONTRAST:  75mL OMNIPAQUE IOHEXOL 350 MG/ML SOLN COMPARISON:  CT chest abdomen and pelvis 06/10/2022 FINDINGS: Lower chest: Bilateral airspace opacities in the lower lungs compatible with multifocal pneumonia. Hepatobiliary: Hepatic steatosis. The gallbladder is decompressed. No biliary dilation. Pancreas: Similar to slightly decreased fluid and inflammatory changes along the pancreatico duodenal groove (series 3/image 37-40). No organized fluid collection. No pancreatic necrosis the portal vein is patent. Spleen: Unremarkable. Adrenals/Urinary Tract: Normal adrenal glands. No urinary calculi or hydronephrosis. Nondistended bladder. Stomach/Bowel: Normal caliber large and small bowel. Normal appendix. Mild wall thickening of the duodenum in the region of the pancreatic head. Stomach is within normal limits. Vascular/Lymphatic: Aortic atherosclerotic calcification. No lymphadenopathy. Reproductive: Unremarkable. Other: No free intraperitoneal air. Musculoskeletal: No acute osseous abnormality. IMPRESSION: 1. Improving groove pancreatitis. 2. Multifocal pneumonia. 3. Hepatic steatosis. Aortic Atherosclerosis (ICD10-I70.0). Electronically Signed   By: Minerva Fester M.D.   On: 06/14/2022 01:27   DG Chest Port 1 View  Result Date: 06/13/2022 CLINICAL DATA:  Questionable sepsis - evaluate for abnormality EXAM: PORTABLE CHEST 1 VIEW COMPARISON:  CT angio chest 06/10/2022, chest x-ray 06/09/2022 FINDINGS: The heart and mediastinal contours are unchanged. Aortic calcification.  Persistent right base and right middle lobe consolidation. No pulmonary edema. No pleural effusion. No pneumothorax. No acute osseous abnormality. IMPRESSION: 1. Persistent right base and right middle lobe infection. Followup PA and lateral chest X-ray is recommended in 3-4 weeks following therapy to ensure resolution. 2.  Aortic Atherosclerosis (ICD10-I70.0). Electronically Signed   By: Tish Frederickson M.D.   On: 06/13/2022 22:51    Assessment/Plan Present on Admission:  Alcoholic pancreatitis, acute on chronic -N.p.o., aggressive IV fluid hydration -Lipase level in a.m. -Pain control, as needed Dilaudid ordered -Resumed patient Protonix, sucralfate and Creon   CAP (community acquired pneumonia)/multifocal pneumonia -Initiate pneumonia protocol -Pharmacy contacted by EDP, recommendation was cefepime and azithromycin.  This was started in the ER, will continue -DuoNebs, oxygen if needed to keep sats greater than 88% -IV fluid hydration -Code sepsis initiated d/t pneumonia.  Blood cultures x 2 collected. -Will order procalcitonin.  Lactic acid not collected.   Chronic alcohol abuse  Alcohol withdrawal -Initiate CIWA protocol, Librium maintenance -Resume folic acid, thiamine, MVI  Mild hyponatremia/hypokalemia -Repleting IV -BMP in a.m.  Acute metabolic encephalopathy -Multifactorial, due to above.  Treating underlying symptoms.  Already improved   Essential hypertension -Stable, patient's Norvasc resumed  Jenet Durio 06/14/2022, 3:32 AM

## 2022-06-14 NOTE — Progress Notes (Signed)
Pharmacy Electrolyte Replacement  Recent Labs:  Recent Labs    06/14/22 0406  K 3.0*  MG 1.4*  PHOS 3.3  CREATININE 0.90    Low Critical Values (K </= 2.5, Phos </= 1, Mg </= 1) Present: K = 3.0 and Mg = 1.4  MD Contacted: Dr. Dartha Lodge  Plan:  Give KCl IV x 6 runs ( IV total, while NPO) Give Magnesium sulfate 4g IV x 1   Wilburn Cornelia, PharmD, BCPS Clinical Pharmacist 06/14/2022 7:43 AM   Please refer to AMION for pharmacy phone number

## 2022-06-14 NOTE — Progress Notes (Signed)
PROGRESS NOTE    Arlington Sigmund  ZOX:[id number] DOB: [birth year] DOA: 06/13/2022 PCP: [name], MD  Outpatient Specialists:     Brief Narrative:  Patient is a 57 year old African-American male, with history of alcohol abuse, chronic abdominal pain and pancreatitis.  Patient is homeless.  Patient was recently admitted with community-acquired pneumonia but seemed not to have taken the prescribed antibiotics.  As per prior documentation, patient was said to be altered.  Patient continues to drink alcohol.  Patient was febrile on admission.  CT scan of the abdomen revealed stable pancreatic changes.  Lipase is not elevated.  However, multifocal pneumonia is reported.  Patient is currently on azithromycin and cefepime.  06/14/2022: Patient seen.  Above history noted.  Patient is asking if he will be discharged today.  I have explained the need to remain in the hospital and have adequate treatment for the pneumonia.  Patient is on CIWA Protocol.  No withdrawal symptoms.  Specifically, no tremors.  Altered mentation seems to have resolved.  Patient has dry cough.   Assessment & Plan:   Principal Problem:   CAP (community acquired pneumonia) Active Problems:   Essential hypertension   Alcohol withdrawal   Chronic alcohol abuse   Anxiety and depression   Homeless   Alcoholic pancreatitis    Alcoholic pancreatitis, acute on chronic: -CT scan of the abdomen revealed stable changes. -Lipase is not elevated. -No nausea, vomiting or abdominal pain. -Patient is currently NPO.  However, will start patient on clear liquid diet and advance as tolerated.  Will have speech therapy evaluate patient to rule out silent aspiration prior to above. -Patient has been counseled to quit alcohol use.    CAP (community acquired pneumonia)/multifocal pneumonia: -Speech evaluation. -Continue azithromycin and cefepime. -DuoNebs, oxygen if needed to keep sats greater than 88% -IV fluid hydration -Code  sepsis initiated d/t pneumonia.  Blood cultures x 2 collected. -Procalcitonin is 1.02.      Chronic alcohol abuse -Continue CIWA protocol. -Continue folic acid, thiamine and multivitamin.  Mild hyponatremia/hypokalemia/hypomagnesemia: -Chronic hyponatremia. -Possibly secondary to volume depletion versus alcohol use.  Urine cutaneous positive.   -Check urine sodium.  Repeat BMP. -Replace potassium and magnesium.  Acute metabolic encephalopathy: -Multifactorial -Resolved significantly.    Essential hypertension: -Continue to optimize. -Goal blood pressure should be less than 130/80 mmHg.     DVT prophylaxis: Start subcutaneous Lovenox 40 Mg once daily Code Status: Full code Family Communication:  Disposition Plan:    Consultants:  None  Procedures:  None  Antimicrobials:  IV azithromycin IV cefepime   Subjective: No new complaints. Patient continues to close. No nausea or vomiting No abdominal pain No tremors or withdrawal symptoms  Objective: Vitals:   06/14/22 0630 06/14/22 0657 06/14/22 0730 06/14/22 0839  BP: (!) 139/91  (!) 147/90   Pulse: 85  (!) 108   Resp: (!) 21     Temp:  98.1 F (36.7 C)    TempSrc:  Oral    SpO2: 100%   98%    Intake/Output Summary (Last 24 hours) at 06/14/2022 8119 Last data filed at 06/14/2022 0049 Gross per 24 hour  Intake 2250 ml  Output --  Net 2250 ml   There were no vitals filed for this visit.  Examination:  General exam: Appears calm and comfortable  Respiratory system: Crackles lung bases posteriorly.   Cardiovascular system: S1 & S2 heard Gastrointestinal system: Abdomen is obese, soft and nontender.   Central nervous system: Alert and oriented. No focal  neurological deficits. Extremities: No leg edema.  Data Reviewed: I have personally reviewed following labs and imaging studies  CBC: Recent Labs  Lab 06/10/22 1145 06/11/22 0215 06/12/22 0217 06/13/22 2310 06/14/22 0406  WBC 14.1* 11.3* 9.8 10.4  7.4  NEUTROABS 12.7*  --   --  6.6 4.1  HGB 10.1* 9.8* 10.9* 10.2* 10.1*  HCT 29.7* 29.5* 31.6* 30.3* 30.9*  MCV 92.5 94.2 91.9 92.9 93.9  PLT 105* 111* 165 272 267   Basic Metabolic Panel: Recent Labs  Lab 06/09/22 2138 06/10/22 1048 06/10/22 1632 06/11/22 0215 06/12/22 0217 06/13/22 2310 06/14/22 0406  NA  --  129*  --  134* 131* 132* 133*  K  --  3.5  --  3.4* 3.5 3.4* 3.0*  CL  --  96*  --  98 98 95* 98  CO2  --  21*  --  24 23 17* 18*  GLUCOSE  --  102*  --  82 94 73 73  BUN  --  7  --  5* <5* 14 9  CREATININE  --  0.75 0.71 0.59* 0.66 1.01 0.90  CALCIUM  --  8.0*  --  8.3* 8.8* 9.5 8.9  MG 1.4*  --   --  1.9  --   --  1.4*  PHOS  --   --   --   --   --   --  3.3   GFR: Estimated Creatinine Clearance: 99.7 mL/min (by C-G formula based on SCr of 0.9 mg/dL). Liver Function Tests: Recent Labs  Lab 06/09/22 2026 06/10/22 1048 06/11/22 0215 06/13/22 2310 06/14/22 0406  AST 102* 79* 87* 51* 43*  ALT 32 ALKPHOS 117 90 81 84 77  BILITOT 1.0 1.4* 1.4* 2.1* 1.7*  PROT 8.8* 7.3 7.0 7.9 7.0  ALBUMIN 3.6 2.9* 2.6* 3.0* 2.5*   Recent Labs  Lab 06/10/22 1048 06/14/22 0406  LIPASE 28 35   No results for input(s): "AMMONIA" in the last 168 hours. Coagulation Profile: Recent Labs  Lab 06/13/22 2310  INR 1.1   Cardiac Enzymes: No results for input(s): "CKTOTAL", "CKMB", "CKMBINDEX", "TROPONINI" in the last 168 hours. BNP (last 3 results) No results for input(s): "PROBNP" in the last 8760 hours. HbA1C: No results for input(s): "HGBA1C" in the last 72 hours. CBG: Recent Labs  Lab 06/11/22 1543 06/11/22 1941 06/11/22 2335 06/12/22 0337 06/12/22 0754  GLUCAP 80 92 108* 116* 154*   Lipid Profile: No results for input(s): "CHOL", "HDL", "LDLCALC", "TRIG", "CHOLHDL", "LDLDIRECT" in the last 72 hours. Thyroid Function Tests: No results for input(s): "TSH", "T4TOTAL", "FREET4", "T3FREE", "THYROIDAB" in the last 72 hours. Anemia Panel: No results  for input(s): "VITAMINB12", "FOLATE", "FERRITIN", "TIBC", "IRON", "RETICCTPCT" in the last 72 hours. Urine analysis:    Component Value Date/Time   COLORURINE YELLOW 06/14/2022 0527   APPEARANCEUR CLEAR 06/14/2022 0527   LABSPEC 1.029 06/14/2022 0527   PHURINE 6.0 06/14/2022 0527   GLUCOSEU NEGATIVE 06/14/2022 0527   HGBUR NEGATIVE 06/14/2022 0527   BILIRUBINUR NEGATIVE 06/14/2022 0527   KETONESUR 80 (A) 06/14/2022 0527   PROTEINUR NEGATIVE 06/14/2022 0527   NITRITE NEGATIVE 06/14/2022 0527   LEUKOCYTESUR NEGATIVE 06/14/2022 0527   Sepsis Labs: (procalcitonin:4,lacticidven:4)  ) Recent Results (from the past 240 hour(s))  Culture, blood (Routine X 2) w Reflex to ID Panel     Status: None (Preliminary result)   Collection Time: 06/10/22  9:30 AM   Specimen: BLOOD  Result Value Ref Range  Status   Specimen Description BLOOD RIGHT ANTECUBITAL  Final   Special Requests   Final    BOTTLES DRAWN AEROBIC AND ANAEROBIC Blood Culture adequate volume   Culture   Final    NO GROWTH 4 DAYS Performed at [facility] Lab, [address], [place], [place] [address]    Report Status PENDING  Incomplete  SARS Coronavirus 2 by RT PCR (hospital order, performed in [facility] hospital lab) *cepheid single result test* Peripheral     Status: None   Collection Time: 06/10/22  3:27 PM   Specimen: Peripheral; Nasal Swab  Result Value Ref Range Status   SARS Coronavirus 2 by RT PCR NEGATIVE NEGATIVE Final    Comment: Performed at [facility], [address], [place], [place] [address]  Respiratory (~20 pathogens) panel by PCR     Status: None   Collection Time: 06/10/22  3:27 PM   Specimen: Nasopharyngeal Swab; Respiratory  Result Value Ref Range Status   Adenovirus NOT DETECTED NOT DETECTED Final   Coronavirus 229E NOT DETECTED NOT DETECTED Final    Comment: (NOTE) The Coronavirus on the Respiratory Panel, DOES NOT test for the novel  Coronavirus (2019 nCoV)    Coronavirus  HKU1 NOT DETECTED NOT DETECTED Final   Coronavirus NL63 NOT DETECTED NOT DETECTED Final   Coronavirus OC43 NOT DETECTED NOT DETECTED Final   Metapneumovirus NOT DETECTED NOT DETECTED Final   Rhinovirus / Enterovirus NOT DETECTED NOT DETECTED Final   Influenza A NOT DETECTED NOT DETECTED Final   Influenza B NOT DETECTED NOT DETECTED Final   Parainfluenza Virus 1 NOT DETECTED NOT DETECTED Final   Parainfluenza Virus 2 NOT DETECTED NOT DETECTED Final   Parainfluenza Virus 3 NOT DETECTED NOT DETECTED Final   Parainfluenza Virus 4 NOT DETECTED NOT DETECTED Final   Respiratory Syncytial Virus NOT DETECTED NOT DETECTED Final   Bordetella pertussis NOT DETECTED NOT DETECTED Final   Bordetella Parapertussis NOT DETECTED NOT DETECTED Final   Chlamydophila pneumoniae NOT DETECTED NOT DETECTED Final   Mycoplasma pneumoniae NOT DETECTED NOT DETECTED Final    Comment: Performed at [facility] Lab, [address] [place], [place] [address]  Culture, blood (Routine X 2) w Reflex to ID Panel     Status: None (Preliminary result)   Collection Time: 06/10/22  4:32 PM   Specimen: BLOOD LEFT ARM  Result Value Ref Range Status   Specimen Description BLOOD LEFT ARM  Final   Special Requests   Final    BOTTLES DRAWN AEROBIC AND ANAEROBIC Blood Culture adequate volume   Culture   Final    NO GROWTH 4 DAYS Performed at [facility], [address] [place], [place] [address]    Report Status PENDING  Incomplete  MRSA Next Gen by PCR, Nasal     Status: None   Collection Time: 06/10/22  5:30 PM   Specimen: Nasal Mucosa; Nasal Swab  Result Value Ref Range Status   MRSA by PCR Next Gen NOT DETECTED NOT DETECTED Final    Comment: (NOTE) The GeneXpert MRSA Assay (FDA approved for NASAL specimens only), is one component of a comprehensive MRSA colonization surveillance program. It is not intended to diagnose MRSA infection nor to guide or monitor treatment for MRSA infections. Test performance is not  FDA approved in patients less than 74 years old. Performed at [facility] Lab, [address], [place], [place] [address]   Urine Culture (for pregnant, neutropenic or  urologic patients or patients with an indwelling urinary catheter)     Status: None   Collection Time: 06/10/22  6:14 PM   Specimen: Urine, Clean Catch  Result Value Ref Range Status   Specimen Description URINE, CLEAN CATCH  Final   Special Requests NONE  Final   Culture   Final    NO GROWTH Performed at [facility], [address], [place], [place] [address]    Report Status 06/11/2022 FINAL  Final  Blood Culture (routine x 2)     Status: None (Preliminary result)   Collection Time: 06/13/22 11:10 PM   Specimen: BLOOD  Result Value Ref Range Status   Specimen Description BLOOD SITE NOT SPECIFIED  Final   Special Requests   Final    BOTTLES DRAWN AEROBIC AND ANAEROBIC Blood Culture adequate volume   Culture   Final    NO GROWTH < 12 HOURS Performed at [facility], [address] [place], [place] [address]    Report Status PENDING  Incomplete  Blood Culture (routine x 2)     Status: None (Preliminary result)   Collection Time: 06/13/22 11:10 PM   Specimen: BLOOD LEFT FOREARM  Result Value Ref Range Status   Specimen Description BLOOD LEFT FOREARM  Final   Special Requests   Final    BOTTLES DRAWN AEROBIC AND ANAEROBIC Blood Culture adequate volume   Culture   Final    NO GROWTH < 12 HOURS Performed at [facility] Lab, [address] [place], [place] [address]    Report Status PENDING  Incomplete  Resp panel by RT-PCR (RSV, Flu A&B, Covid) Anterior Nasal Swab     Status: None   Collection Time: 06/14/22 12:54 AM   Specimen: Anterior Nasal Swab  Result Value Ref Range Status   SARS Coronavirus 2 by RT PCR NEGATIVE NEGATIVE Final   Influenza A by PCR NEGATIVE NEGATIVE Final   Influenza B by PCR NEGATIVE NEGATIVE Final    Comment: (NOTE) The Xpert Xpress SARS-CoV-2/FLU/RSV plus assay is  intended as an aid in the diagnosis of influenza from Nasopharyngeal swab specimens and should not be used as a sole basis for treatment. Nasal washings and aspirates are unacceptable for Xpert Xpress SARS-CoV-2/FLU/RSV testing.  Fact Sheet for Patients: BloggerCourse.com  Fact Sheet for Healthcare Providers: SeriousBroker.it  This test is not yet approved or cleared by the [place] FDA and has been authorized for detection and/or diagnosis of SARS-CoV-2 by FDA under an Emergency Use Authorization (EUA). This EUA will remain in effect (meaning this test can be used) for the duration of the COVID-19 declaration under Section 564(b)(1) of the Act, 21 U.S.C. section 360bbb-3(b)(1), unless the authorization is terminated or revoked.     Resp Syncytial Virus by PCR NEGATIVE NEGATIVE Final    Comment: (NOTE) Fact Sheet for Patients: BloggerCourse.com  Fact Sheet for Healthcare Providers: SeriousBroker.it  This test is not yet approved or cleared by the [place] FDA and has been authorized for detection and/or diagnosis of SARS-CoV-2 by FDA under an Emergency Use Authorization (EUA). This EUA will remain in effect (meaning this test can be used) for the duration of the COVID-19 declaration under Section 564(b)(1) of the Act, 21 U.S.C. section 360bbb-3(b)(1), unless the authorization is terminated or revoked.  Performed at [facility], [address], [place], [place] [address]          Radiology Studies: CT ABDOMEN PELVIS W CONTRAST  Result Date:  06/14/2022 CLINICAL DATA:  Discharged yesterday for pneumonia. Very weak. Abdominal pain with some diffuse tenderness to palpation and right upper quadrant pain EXAM: CT ABDOMEN AND PELVIS WITH CONTRAST TECHNIQUE: Multidetector CT imaging of the abdomen and pelvis was performed using the standard protocol following  bolus administration of intravenous contrast. RADIATION DOSE REDUCTION: This exam was performed according to the departmental dose-optimization program which includes automated exposure control, adjustment of the mA and/or kV according to patient size and/or use of iterative reconstruction technique. CONTRAST:  75mL OMNIPAQUE IOHEXOL 350 MG/ML SOLN COMPARISON:  CT chest abdomen and pelvis 06/10/2022 FINDINGS: Lower chest: Bilateral airspace opacities in the lower lungs compatible with multifocal pneumonia. Hepatobiliary: Hepatic steatosis. The gallbladder is decompressed. No biliary dilation. Pancreas: Similar to slightly decreased fluid and inflammatory changes along the pancreatico duodenal groove (series 3/image 37-40). No organized fluid collection. No pancreatic necrosis the portal vein is patent. Spleen: Unremarkable. Adrenals/Urinary Tract: Normal adrenal glands. No urinary calculi or hydronephrosis. Nondistended bladder. Stomach/Bowel: Normal caliber large and small bowel. Normal appendix. Mild wall thickening of the duodenum in the region of the pancreatic head. Stomach is within normal limits. Vascular/Lymphatic: Aortic atherosclerotic calcification. No lymphadenopathy. Reproductive: Unremarkable. Other: No free intraperitoneal air. Musculoskeletal: No acute osseous abnormality. IMPRESSION: 1. Improving groove pancreatitis. 2. Multifocal pneumonia. 3. Hepatic steatosis. Aortic Atherosclerosis (ICD10-I70.0). Electronically Signed   By: Minerva Fester M.D.   On: 06/14/2022 01:27   DG Chest Port 1 View  Result Date: 06/13/2022 CLINICAL DATA:  Questionable sepsis - evaluate for abnormality EXAM: PORTABLE CHEST 1 VIEW COMPARISON:  CT angio chest 06/10/2022, chest x-ray 06/09/2022 FINDINGS: The heart and mediastinal contours are unchanged. Aortic calcification. Persistent right base and right middle lobe consolidation. No pulmonary edema. No pleural effusion. No pneumothorax. No acute osseous abnormality.  IMPRESSION: 1. Persistent right base and right middle lobe infection. Followup PA and lateral chest X-ray is recommended in 3-4 weeks following therapy to ensure resolution. 2.  Aortic Atherosclerosis (ICD10-I70.0). Electronically Signed   By: Tish Frederickson M.D.   On: 06/13/2022 22:51        Scheduled Meds:  amLODipine  5 mg Oral Daily   chlordiazePOXIDE  25 mg Oral TID AC & HS   Followed by   Melene Muller ON 06/15/2022] chlordiazePOXIDE  25 mg Oral TID   Followed by   Melene Muller ON 06/16/2022] chlordiazePOXIDE  25 mg Oral BH-qamhs   Followed by   Melene Muller ON 06/17/2022] chlordiazePOXIDE  25 mg Oral Daily   enoxaparin (LOVENOX) injection  40 mg Subcutaneous Daily   folic acid  1 mg Oral Daily   lipase/protease/amylase  12,000 Units Oral TID AC   multivitamin with minerals  1 tablet Oral Daily   pantoprazole  40 mg Oral BID AC   sucralfate  1 g Oral TID WC & HS   thiamine  100 mg Oral Daily   Or   thiamine  100 mg Intravenous Daily   Continuous Infusions:  azithromycin     ceFEPime (MAXIPIME) IV Stopped (06/14/22 0820)   lactated ringers 150 mL/hr at 06/14/22 0051   magnesium sulfate bolus IVPB 4 g (06/14/22 0838)   potassium chloride 10 mEq (06/14/22 0835)     LOS: 0 days    Time spent: 55 minutes.    Berton Mount, MD  [facility] Pager #: [phone] 7PM-7AM contact night coverage as above

## 2022-06-14 NOTE — TOC Progression Note (Signed)
Transition of Care [facility]) - Progression Note    Patient Details  Name: Samuel Blevins MRN: [id number] Date of Birth: 20-May-1965  Transition of Care [facility]) CM/SW Contact  Helene Kelp, [place] Phone Number: 06/14/2022, 5:01 PM  Clinical Narrative:    CSW met with the patient at the bedside and updated him to the current disposition. The CSW spoke with the patient regarding their presentation. The patient reviewed factors related to their presentation. This Clinical research associate reviewed and provided the patient with community based resources to help, upon the time and post discharge. The patient was receptive and noted willingness to review and follow-up resources provided.   Patient disposition will be updated. TOC to follow and provide clinical support for the patient, per clinical team request/recommendations. No other needs identified by this writer at the time of providing clinical support.        Expected Discharge Plan and Services                                               Social Determinants of Health (SDOH) Interventions SDOH Screenings   Food Insecurity: Food Insecurity Present (06/10/2022)  Housing: Medium Risk (06/10/2022)  Transportation Needs: Patient Declined (06/10/2022)  Utilities: Patient Declined (06/10/2022)  Depression (PHQ2-9): High Risk (02/14/2022)  Tobacco Use: High Risk (06/10/2022)    Readmission Risk Interventions     No data to display

## 2022-06-14 NOTE — Evaluation (Signed)
Clinical/Bedside Swallow Evaluation Patient Details  Name: Gemini Beaumier MRN: [id number] Date of Birth: [birth year]  Today's Date: 06/14/2022 Time: SLP Start Time (ACUTE ONLY): 1109 SLP Stop Time (ACUTE ONLY): 1120 SLP Time Calculation (min) (ACUTE ONLY): 11 min  Past Medical History:  Past Medical History:  Diagnosis Date   Alcohol withdrawal syndrome with complication    Alcoholism    Elevated AST (SGOT)    Gastrointestinal hemorrhage    Homeless    Hypertension    Pancreatic insufficiency    takes Creon   Symptomatic anemia 11/23/2015   Thrombocytopenia 05/09/2021   Past Surgical History:  Past Surgical History:  Procedure Laterality Date   BIOPSY  06/23/2019   Procedure: BIOPSY;  Surgeon: Shellia Cleverly, DO;  Location: [facility] ENDOSCOPY;  Service: Gastroenterology;;   BIOPSY  12/12/2020   Procedure: BIOPSY;  Surgeon: Lynann Bologna, MD;  Location: [facility] ENDOSCOPY;  Service: Endoscopy;;   BIOPSY  08/04/2021   Procedure: BIOPSY;  Surgeon: Jenel Lucks, MD;  Location: [facility] ENDOSCOPY;  Service: Gastroenterology;;   ESOPHAGOGASTRODUODENOSCOPY N/A 08/04/2021   Procedure: ESOPHAGOGASTRODUODENOSCOPY (EGD);  Surgeon: Jenel Lucks, MD;  Location: [facility] ENDOSCOPY;  Service: Gastroenterology;  Laterality: N/A;   ESOPHAGOGASTRODUODENOSCOPY (EGD) WITH PROPOFOL N/A 06/23/2019   Procedure: ESOPHAGOGASTRODUODENOSCOPY (EGD) WITH PROPOFOL;  Surgeon: Shellia Cleverly, DO;  Location: [facility] ENDOSCOPY;  Service: Gastroenterology;  Laterality: N/A;   ESOPHAGOGASTRODUODENOSCOPY (EGD) WITH PROPOFOL N/A 12/12/2020   Procedure: ESOPHAGOGASTRODUODENOSCOPY (EGD) WITH PROPOFOL;  Surgeon: Lynann Bologna, MD;  Location: [facility] ENDOSCOPY;  Service: Endoscopy;  Laterality: N/A;   HOT HEMOSTASIS N/A 06/23/2019   Procedure: HOT HEMOSTASIS (ARGON PLASMA COAGULATION/BICAP);  Surgeon: Shellia Cleverly, DO;  Location: [facility] ENDOSCOPY;  Service: Gastroenterology;  Laterality: N/A;   HPI:  57 year old African-American male  presented to ED with dx pna. Prior medical history of alcohol abuse, chronic abdominal pain and pancreatitis.  Patient is homeless.  Patient was recently admitted with community-acquired pneumonia but seemed not to have taken the prescribed antibiotics.  CT scan of the abdomen revealed stable pancreatic changes. Swallow was evaluated 06/10/22 with normal findings.    Assessment / Plan / Recommendation  Clinical Impression  Pt participated in clinical swallow assessment.  Results are c/w findings from 4/15 evaluation. There were no s/s of a dysphagia. He protects airway well; vocal quality clear; no coughing.  He is safe for clear liquids and diet may be advanced per MD. No further SLP f/u is warranted. SLP Visit Diagnosis: Dysphagia, unspecified (R13.10)    Aspiration Risk  No limitations    Diet Recommendation   Clear liquids per MD - may be advanced without SLP involvment  Medication Administration: Whole meds with liquid    Other  Recommendations Oral Care Recommendations: Oral care BID    Recommendations for follow up therapy are one component of a multi-disciplinary discharge planning process, led by the attending physician.  Recommendations may be updated based on patient status, additional functional criteria and insurance authorization.  Follow up Recommendations No SLP follow up        Swallow Study   General Date of Onset: 06/13/22 HPI: 57 year old African-American male presented to ED with dx pna. Prior medical history of alcohol abuse, chronic abdominal pain and pancreatitis.  Patient is homeless.  Patient was recently admitted with community-acquired pneumonia but seemed not to have taken the prescribed antibiotics.  CT scan of the abdomen revealed stable pancreatic changes. Swallow was evaluated 06/10/22 with normal findings. Type of Study: Bedside Swallow Evaluation Previous Swallow Assessment:  4/15 Diet Prior to this Study: NPO Temperature Spikes Noted: No Respiratory  Status: Room air History of Recent Intubation: No Behavior/Cognition: Alert;Cooperative Oral Cavity Assessment: Within Functional Limits Oral Care Completed by SLP: No Oral Cavity - Dentition: Adequate natural dentition Vision: Functional for self-feeding Self-Feeding Abilities: Able to feed self Patient Positioning: Upright in bed Baseline Vocal Quality: Normal Volitional Cough: Strong Volitional Swallow: Able to elicit    Oral/Motor/Sensory Function Overall Oral Motor/Sensory Function: Within functional limits   Ice Chips Ice chips: Within functional limits   Thin Liquid Thin Liquid: Within functional limits    Nectar Thick Nectar Thick Liquid: Not tested   Honey Thick Honey Thick Liquid: Not tested   Puree Puree: Not tested   Solid     Solid: Not tested      Blenda Mounts Laurice 06/14/2022,11:24 AM  Marchelle Folks L. Samson Frederic, MA CCC/SLP Clinical Specialist - Acute Care SLP Acute Rehabilitation Services Office number [phone]

## 2022-06-15 ENCOUNTER — Inpatient Hospital Stay (HOSPITAL_COMMUNITY): Payer: Medicaid Other

## 2022-06-15 DIAGNOSIS — J189 Pneumonia, unspecified organism: Secondary | ICD-10-CM | POA: Diagnosis not present

## 2022-06-15 LAB — CBC WITH DIFFERENTIAL/PLATELET
Abs Immature Granulocytes: 0 10*3/uL (ref 0.00–0.07)
Basophils Absolute: 0.1 10*3/uL (ref 0.0–0.1)
Basophils Relative: 1 %
Eosinophils Absolute: 0.3 10*3/uL (ref 0.0–0.5)
Eosinophils Relative: 6 %
HCT: 31.8 % — ABNORMAL LOW (ref 39.0–52.0)
Hemoglobin: 10.6 g/dL — ABNORMAL LOW (ref 13.0–17.0)
Lymphocytes Relative: 18 %
Lymphs Abs: 1 10*3/uL (ref 0.7–4.0)
MCH: 30.7 pg (ref 26.0–34.0)
MCHC: 33.3 g/dL (ref 30.0–36.0)
MCV: 92.2 fL (ref 80.0–100.0)
Monocytes Absolute: 0.5 10*3/uL (ref 0.1–1.0)
Monocytes Relative: 9 %
Neutro Abs: 3.6 10*3/uL (ref 1.7–7.7)
Neutrophils Relative %: 66 %
Platelets: 333 10*3/uL (ref 150–400)
RBC: 3.45 MIL/uL — ABNORMAL LOW (ref 4.22–5.81)
RDW: 15.6 % — ABNORMAL HIGH (ref 11.5–15.5)
WBC: 5.4 10*3/uL (ref 4.0–10.5)
nRBC: 0 % (ref 0.0–0.2)
nRBC: 0 /100 WBC

## 2022-06-15 LAB — RENAL FUNCTION PANEL
Albumin: 2.4 g/dL — ABNORMAL LOW (ref 3.5–5.0)
Anion gap: 9 (ref 5–15)
BUN: 5 mg/dL — ABNORMAL LOW (ref 6–20)
CO2: 22 mmol/L (ref 22–32)
Calcium: 8.2 mg/dL — ABNORMAL LOW (ref 8.9–10.3)
Chloride: 99 mmol/L (ref 98–111)
Creatinine, Ser: 0.7 mg/dL (ref 0.61–1.24)
GFR, Estimated: >60 mL/min (ref >60)
Glucose, Bld: 109 mg/dL — ABNORMAL HIGH (ref 70–99)
Phosphorus: 3.1 mg/dL (ref 2.5–4.6)
Potassium: 2.9 mmol/L — ABNORMAL LOW (ref 3.5–5.1)
Sodium: 130 mmol/L — ABNORMAL LOW (ref 135–145)

## 2022-06-15 LAB — C-REACTIVE PROTEIN: CRP: 10.1 mg/dL — ABNORMAL HIGH (ref <1.0)

## 2022-06-15 LAB — CULTURE, BLOOD (ROUTINE X 2): Special Requests: ADEQUATE

## 2022-06-15 LAB — MAGNESIUM: Magnesium: 1.3 mg/dL — ABNORMAL LOW (ref 1.7–2.4)

## 2022-06-15 LAB — PHOSPHORUS: Phosphorus: 3 mg/dL (ref 2.5–4.6)

## 2022-06-15 LAB — LIPASE, BLOOD: Lipase: 64 U/L — ABNORMAL HIGH (ref 11–51)

## 2022-06-15 MED ORDER — POTASSIUM CHLORIDE 2 MEQ/ML IV SOLN
INTRAVENOUS | Status: DC
  Filled 2022-06-15: qty 1000

## 2022-06-15 MED ORDER — MAGNESIUM SULFATE 4 GM/100ML IV SOLN
4.0000 g | Freq: Once | INTRAVENOUS | Status: AC
  Administered 2022-06-15: 4 g via INTRAVENOUS
  Filled 2022-06-15: qty 100

## 2022-06-15 MED ORDER — POTASSIUM CHLORIDE CRYS ER 20 MEQ PO TBCR
40.0000 meq | EXTENDED_RELEASE_TABLET | Freq: Two times a day (BID) | ORAL | Status: DC
  Administered 2022-06-15: 40 meq via ORAL
  Filled 2022-06-15: qty 2

## 2022-06-15 MED ORDER — ACETAMINOPHEN 500 MG PO TABS
500.0000 mg | ORAL_TABLET | ORAL | Status: AC
  Administered 2022-06-15: 500 mg via ORAL
  Filled 2022-06-15: qty 1

## 2022-06-15 MED ORDER — NICOTINE 14 MG/24HR TD PT24
14.0000 mg | MEDICATED_PATCH | Freq: Every day | TRANSDERMAL | Status: DC
  Administered 2022-06-16 – 2022-06-17 (×3): 14 mg via TRANSDERMAL
  Filled 2022-06-15 (×3): qty 1

## 2022-06-15 MED ORDER — SODIUM CHLORIDE 0.9 % IV SOLN
3.0000 g | Freq: Four times a day (QID) | INTRAVENOUS | Status: DC
  Administered 2022-06-15 – 2022-06-18 (×11): 3 g via INTRAVENOUS
  Filled 2022-06-15 (×11): qty 8

## 2022-06-15 MED ORDER — ACETAMINOPHEN 500 MG PO TABS
500.0000 mg | ORAL_TABLET | Freq: Four times a day (QID) | ORAL | Status: DC | PRN
  Administered 2022-06-15 – 2022-06-16 (×2): 500 mg via ORAL
  Filled 2022-06-15 (×2): qty 1

## 2022-06-15 MED ORDER — POTASSIUM CHLORIDE 2 MEQ/ML IV SOLN
INTRAVENOUS | Status: AC
  Filled 2022-06-15 (×3): qty 1000

## 2022-06-15 NOTE — Progress Notes (Signed)
PROGRESS NOTE                                                                                                                                                                                                             Patient Demographics:    Samuel Blevins, is a 57 y.o. male, DOB - 1965-09-13, WUJ:[id number]  Outpatient Primary MD for the patient is Samuel Frisk, MD    LOS - 1  Admit date - 06/13/2022    Chief Complaint  Patient presents with   Weakness    Pneumonia symptoms        Brief Narrative (HPI from H&P)    57 year old African-American male, with history of alcohol abuse, chronic abdominal pain and chronic pancreatitis, patient lives in a shelter he is currently homeless, apparently for the last few days prior to admission he started having a fever came to the ER where he was diagnosed with aspiration pneumonia along with acute on chronic pancreatitis and admitted to the hospital.   Subjective:    Samuel Blevins today has, no headache, no chest pain no cough, still having some abdominal pain which is improving, no shortness of breath.   Assessment  & Plan :    Acute on chronic alcoholic pancreatitis.  CT scan stable, continue to monitor lipase and clinically, still having abdominal pain [place] will titrate down his diet to soft on 06/15/2022, strictly counseled to abstain from alcohol.  Hydrate with IV fluids and monitor.  Dehydration with hypokalemia and hypomagnesemia.  IV fluids, replace oral and IV potassium, IV magnesium.  Monitor.   Aspiration pneumonia.  With history of alcohol abuse.  Speech input, Unasyn.  Follow cultures.  Acute metabolic encephalopathy.  Resolving.  No focal deficits.  No headache.  Hypertension.  Monitor on as needed hydralazine.  Alcohol abuse.  On CIWA protocol, strictly counseled to quit alcohol since last drink was around 1 week ago.       Condition -   Guarded  Family Communication  :  None   Code Status :  Full  Consults  :  None  PUD Prophylaxis : PPI   Procedures  :      CT - 1. Improving groove pancreatitis. 2. Multifocal pneumonia. 3. Hepatic steatosis. Aortic Atherosclerosis      Disposition Plan  :    Status  is: Inpatient   DVT Prophylaxis  :    enoxaparin (LOVENOX) injection 40 mg Start: 06/14/22 1000    Lab Results  Component Value Date   PLT 333 06/15/2022    Diet :  Diet Order             Diet Heart Room service appropriate? Yes with Assist; Fluid consistency: Thin  Diet effective now                    Inpatient Medications  Scheduled Meds:  amLODipine  5 mg Oral Daily   chlordiazePOXIDE  25 mg Oral TID   Followed by   Melene Muller ON 06/16/2022] chlordiazePOXIDE  25 mg Oral BH-qamhs   Followed by   Melene Muller ON 06/17/2022] chlordiazePOXIDE  25 mg Oral Daily   enoxaparin (LOVENOX) injection  40 mg Subcutaneous Daily   folic acid  1 mg Oral Daily   lipase/protease/amylase  12,000 Units Oral TID AC   multivitamin with minerals  1 tablet Oral Daily   pantoprazole  40 mg Oral BID AC   sucralfate  1 g Oral TID WC & HS   thiamine  100 mg Oral Daily   Or   thiamine  100 mg Intravenous Daily   Continuous Infusions:  azithromycin 500 mg (06/14/22 2204)   ceFEPime (MAXIPIME) IV 2 g (06/15/22 0626)   lactated ringers 1,000 mL with potassium chloride 40 mEq infusion     PRN Meds:.hydrOXYzine, loperamide, LORazepam **OR** LORazepam     Objective:   Vitals:   06/14/22 2000 06/14/22 2311 06/15/22 0318 06/15/22 0742  BP:  (!) 160/90 (!) 157/99 (!) 151/93  Pulse:  93 88 88  Resp:  Temp: 99.2 F (37.3 C) 98.8 F (37.1 C) 98.2 F (36.8 C) 97.9 F (36.6 C)  TempSrc: Oral Oral Oral Axillary  SpO2:  99% 100% 100%    Wt Readings from Last 3 Encounters:  06/11/22 78.8 kg  06/06/22 77.1 kg  05/22/22 77.1 kg     Intake/Output Summary (Last 24 hours) at 06/15/2022 1203 Last data filed  at 06/15/2022 0600 Gross per 24 hour  Intake 580.06 ml  Output --  Net 580.06 ml     Physical Exam  Awake Alert, No new F.N deficits, Normal affect [place].AT,PERRAL Supple Neck, No JVD,   Symmetrical Chest wall movement, Good air movement bilaterally, CTAB RRR,No Gallops,Rubs or new Murmurs,  +ve B.Sounds, Abd Soft, No tenderness,   No Cyanosis, Clubbing or edema       Data Review:    Recent Labs  Lab 06/10/22 1145 06/11/22 0215 06/12/22 0217 06/13/22 2310 06/14/22 0406 06/15/22 0357  WBC 14.1* 11.3* 9.8 10.4 7.4 5.4  HGB 10.1* 9.8* 10.9* 10.2* 10.1* 10.6*  HCT 29.7* 29.5* 31.6* 30.3* 30.9* 31.8*  PLT 105* 111* 165 272 267 333  MCV 92.5 94.2 91.9 92.9 93.9 92.2  MCH 31.5 31.3 31.7 31.3 30.7 30.7  MCHC 34.0 33.2 34.5 33.7 32.7 33.3  RDW 16.2* 16.3* 15.8* 15.7* 15.9* 15.6*  LYMPHSABS 0.8  --   --  1.2 1.3 1.0  MONOABS 0.1  --   --  2.0* 1.5* 0.5  EOSABS 0.0  --   --  0.0 0.0 0.3  BASOSABS 0.0  --   --  0.1 0.1 0.1    Recent Labs  Lab 06/09/22 2026 06/09/22 2138 06/10/22 0335 06/10/22 1043 06/10/22 1048 06/10/22 1632 06/10/22 1757 06/11/22 0215 06/12/22 0217 06/13/22 2310 06/14/22 0406 06/14/22 1801 06/15/22  0357  NA 136  --   --   --  129*  --   --  134* 131* 132* 133* 133* 130*  K 3.2*  --   --   --  3.5  --   --  3.4* 3.5 3.4* 3.0* 3.1* 2.9*  CL 99  --   --   --  96*  --   --  98 98 95* 98 99 99  CO2 22  --   --   --  21*  --   --  24 23 17* 18* 20* 22  ANIONGAP 15  --   --   --  12  --   --  12 10 20* 17* 14 9  GLUCOSE 93  --   --   --  102*  --   --  82 94 73 73 107* 109*  BUN 13  --   --   --  7  --   --  5* <5* 14 9 5* 5*  CREATININE 1.40*  --   --   --  0.75   < >  --  0.59* 0.66 1.01 0.90 0.70 0.70  AST 102*  --   --   --  79*  --   --  87*  --  51* 43*  --   --   ALT 32  --   --   --  26  --   --  30  --  28 24  --   --   ALKPHOS 117  --   --   --  90  --   --  81  --  84 77  --   --   BILITOT 1.0  --   --   --  1.4*  --   --  1.4*  --  2.1*  1.7*  --   --   ALBUMIN 3.6  --   --   --  2.9*  --   --  2.6*  --  3.0* 2.5*  --  2.4*  PROCALCITON  --   --   --   --   --   --   --   --   --   --  1.02  --   --   LATICACIDVEN  --   --  2.1* 2.3*  --   --  1.9  --   --  0.8  --   --   --   INR  --   --   --   --   --   --   --   --   --  1.1  --   --   --   BNP 17.3  --   --   --   --   --   --   --   --   --   --   --   --   MG  --  1.4*  --   --   --   --   --  1.9  --   --  1.4*  --  1.3*  CALCIUM 8.7*  --   --   --  8.0*  --   --  8.3* 8.8* 9.5 8.9 8.4* 8.2*   < > = values in this interval not displayed.      Recent Labs  Lab 06/09/22 2026 06/09/22 2138 06/10/22 0335 06/10/22 1043 06/10/22 1048 06/10/22 1757 06/11/22 0215 06/12/22 0217  06/13/22 2310 06/14/22 0406 06/14/22 1801 06/15/22 0357  PROCALCITON  --   --   --   --   --   --   --   --   --  1.02  --   --   LATICACIDVEN  --   --  2.1* 2.3*  --  1.9  --   --  0.8  --   --   --   INR  --   --   --   --   --   --   --   --  1.1  --   --   --   BNP 17.3  --   --   --   --   --   --   --   --   --   --   --   MG  --  1.4*  --   --   --   --  1.9  --   --  1.4*  --  1.3*  CALCIUM 8.7*  --   --   --    < >  --  8.3* 8.8* 9.5 8.9 8.4* 8.2*   < > = values in this interval not displayed.       Radiology Reports DG Abd Portable 1V  Result Date: 06/15/2022 CLINICAL DATA:  Abdominal pain EXAM: PORTABLE ABDOMEN - 1 VIEW COMPARISON:  06/14/2022 CT FINDINGS: Scattered large and small bowel gas is noted. No obstructive changes are seen. No free air is noted. No acute bony abnormality is seen. IMPRESSION: No acute abnormality noted. Should be noted the initial film is mismarked and corrected on the second film. Electronically Signed   By: Alcide Clever M.D.   On: 06/15/2022 09:46   CT ABDOMEN PELVIS W CONTRAST  Result Date: 06/14/2022 CLINICAL DATA:  Discharged yesterday for pneumonia. Very weak. Abdominal pain with some diffuse tenderness to palpation and right upper quadrant  pain EXAM: CT ABDOMEN AND PELVIS WITH CONTRAST TECHNIQUE: Multidetector CT imaging of the abdomen and pelvis was performed using the standard protocol following bolus administration of intravenous contrast. RADIATION DOSE REDUCTION: This exam was performed according to the departmental dose-optimization program which includes automated exposure control, adjustment of the mA and/or kV according to patient size and/or use of iterative reconstruction technique. CONTRAST:  75mL OMNIPAQUE IOHEXOL 350 MG/ML SOLN COMPARISON:  CT chest abdomen and pelvis 06/10/2022 FINDINGS: Lower chest: Bilateral airspace opacities in the lower lungs compatible with multifocal pneumonia. Hepatobiliary: Hepatic steatosis. The gallbladder is decompressed. No biliary dilation. Pancreas: Similar to slightly decreased fluid and inflammatory changes along the pancreatico duodenal groove (series 3/image 37-40). No organized fluid collection. No pancreatic necrosis the portal vein is patent. Spleen: Unremarkable. Adrenals/Urinary Tract: Normal adrenal glands. No urinary calculi or hydronephrosis. Nondistended bladder. Stomach/Bowel: Normal caliber large and small bowel. Normal appendix. Mild wall thickening of the duodenum in the region of the pancreatic head. Stomach is within normal limits. Vascular/Lymphatic: Aortic atherosclerotic calcification. No lymphadenopathy. Reproductive: Unremarkable. Other: No free intraperitoneal air. Musculoskeletal: No acute osseous abnormality. IMPRESSION: 1. Improving groove pancreatitis. 2. Multifocal pneumonia. 3. Hepatic steatosis. Aortic Atherosclerosis (ICD10-I70.0). Electronically Signed   By: Minerva Fester M.D.   On: 06/14/2022 01:27   DG Chest Port 1 View  Result Date: 06/13/2022 CLINICAL DATA:  Questionable sepsis - evaluate for abnormality EXAM: PORTABLE CHEST 1 VIEW COMPARISON:  CT angio chest 06/10/2022, chest x-ray 06/09/2022 FINDINGS: The heart and mediastinal contours are unchanged. Aortic  calcification. Persistent right  base and right middle lobe consolidation. No pulmonary edema. No pleural effusion. No pneumothorax. No acute osseous abnormality. IMPRESSION: 1. Persistent right base and right middle lobe infection. Followup PA and lateral chest X-ray is recommended in 3-4 weeks following therapy to ensure resolution. 2.  Aortic Atherosclerosis (ICD10-I70.0). Electronically Signed   By: Tish Frederickson M.D.   On: 06/13/2022 22:51      Signature  -   Susa Raring M.D on 06/15/2022 at 12:03 PM   -  To page go to [email or web address]

## 2022-06-15 NOTE — Progress Notes (Signed)
Pharmacy Antibiotic Note  Samuel Blevins is a 57 y.o. male for which pharmacy has been consulted for Unasyn dosing for pneumonia.  Patient with a history of ETOH abuse, HTN, thrombocytopenia . Patient presenting with persistent symptoms following recent discharge from pneumonia admission.  SCr 0.70 on 4/20  WBC 5.4k; afebrile over the last 24 hours   Plan: Start Unasyn 3g q12, 5 day stop date in place  Trend WBC, Fever, Renal function      Temp (24hrs), Avg:98.4 F (36.9 C), Min:97.9 F (36.6 C), Max:99.2 F (37.3 C)  Recent Labs  Lab 06/10/22 0335 06/10/22 1043 06/10/22 1048 06/10/22 1757 06/11/22 0215 06/12/22 0217 06/13/22 2310 06/14/22 0406 06/14/22 1801 06/15/22 0357  WBC  --   --    < >  --  11.3* 9.8 10.4 7.4  --  5.4  CREATININE  --   --    < >  --  0.59* 0.66 1.01 0.90 0.70 0.70  LATICACIDVEN 2.1* 2.3*  --  1.9  --   --  0.8  --   --   --    < > = values in this interval not displayed.     Estimated Creatinine Clearance: 112.2 mL/min (by C-G formula based on SCr of 0.7 mg/dL).    No Known Allergies  Antimicrobials this Admission:  Azithromycin x 1 4/19  Cefepime 4/18 > 4/20   Microbiology results: 4/18 Bcx: NG x 2 days  4/19 Respiratory panel: negative MRSA PCR cancelled   Thank you for allowing pharmacy to be a part of this patient's care.  Blane Ohara, PharmD  PGY1 Pharmacy Resident

## 2022-06-15 NOTE — Evaluation (Signed)
Physical Therapy Evaluation Patient Details Name: Samuel Blevins MRN: [id number] DOB: [birth year] Today's Date: 06/15/2022  History of Present Illness  57 y/o M admitted to [facility] from [facility] on 4/18 for weakness and AMS. CT of abdomen showing improving groove pancreatitis, multifocal PNA, and hepatic steatosis. Recent admission 4/14-4/17 for CAP. PMHx: alcohol abuse, homelessness, chronic abd pain, chronic pancreatitis, chronic anemia, HTN.  Clinical Impression  Pt presents today with generalized weakness, decreased endurance, and impaired balance. Pt reports being modI at baseline with use of rollator, however has had a recent decline in mobility, with 2 falls recently. Today pt reports feeling slightly better, requiring minG for all mobility at this time with use of RW, close guard provided for balance and safety with line management. Pt without LOB with use of AD, however reliant on UE support with standing and ambulation. SPO2 stable on room air but pt reporting fatigue, unable to tolerate increased distances as he does at baseline. Pt will continue to benefit from skilled acute PT during admission to address deficits and progress endurance with mobility. Anticipate progression in mobility with medical management but recommend OPPT upon discharge to progress pt's balance. Will follow as appropriate.      Recommendations for follow up therapy are one component of a multi-disciplinary discharge planning process, led by the attending physician.  Recommendations may be updated based on patient status, additional functional criteria and insurance authorization.  Follow Up Recommendations Can patient physically be transported by private vehicle: Yes     Assistance Recommended at Discharge Intermittent Supervision/Assistance  Patient can return home with the following  A little help with walking and/or transfers    Equipment Recommendations None recommended by PT  Recommendations for Other  Services       Functional Status Assessment Patient has had a recent decline in their functional status and demonstrates the ability to make significant improvements in function in a reasonable and predictable amount of time.     Precautions / Restrictions Precautions Precautions: Fall;Other (comment) Precaution Comments: watch O2 Restrictions Weight Bearing Restrictions: No      Mobility  Bed Mobility Overal bed mobility: Needs Assistance Bed Mobility: Supine to Sit, Sit to Supine     Supine to sit: Min guard, HOB elevated Sit to supine: Min guard, HOB elevated   General bed mobility comments: increased time for bed mobility with cueing for technique, able to complete without physical assist, close guard provided for balance    Transfers Overall transfer level: Needs assistance Equipment used: Rolling walker (2 wheels) Transfers: Sit to/from Stand Sit to Stand: Min guard           General transfer comment: minG for safety and balance, cueing for proper hand placement    Ambulation/Gait Ambulation/Gait assistance: Min guard Gait Distance (Feet): 100 Feet Assistive device: Rolling walker (2 wheels) Gait Pattern/deviations: Step-through pattern, Decreased stride length, Trunk flexed, Drifts right/left Gait velocity: decreased     General Gait Details: cued for upright posture and use of RW as pt utilizing rollator at baseline, assist for balance with mild path deviation  Stairs            Wheelchair Mobility    Modified Rankin (Stroke Patients Only)       Balance Overall balance assessment: Needs assistance Sitting-balance support: No upper extremity supported, Feet supported Sitting balance-Leahy Scale: Good     Standing balance support: Bilateral upper extremity supported, During functional activity, Reliant on assistive device for balance Standing balance-Leahy Scale: Poor Standing  balance comment: reliant on RW                              Pertinent Vitals/Pain Pain Assessment Pain Assessment: Faces Faces Pain Scale: Hurts little more Pain Location: back Pain Descriptors / Indicators: Aching, Sore Pain Intervention(s): Limited activity within patient's tolerance, Monitored during session, Repositioned    Home Living Family/patient expects to be discharged to:: Shelter/Homeless                   Additional Comments: Was staying a [employer], plans to return at discharge. Has rollator and reports SPC as well, stays on first level and has elevator access if needing to go upstairs    Prior Function Prior Level of Function : Needs assist             Mobility Comments: ambulatory with rollator at baseline, but reports recent weakness with 2 falls on day of admission ADLs Comments: reports recent difficulty, requiring 2 hours to put on pants     Hand Dominance   Dominant Hand: Right    Extremity/Trunk Assessment   Upper Extremity Assessment Upper Extremity Assessment: Defer to OT evaluation    Lower Extremity Assessment Lower Extremity Assessment: Generalized weakness (reports diminished sensation in BLE)    Cervical / Trunk Assessment Cervical / Trunk Assessment: Kyphotic  Communication   Communication: No difficulties  Cognition Arousal/Alertness: Awake/alert Behavior During Therapy: Flat affect Overall Cognitive Status: No family/caregiver present to determine baseline cognitive functioning                                 General Comments: Pt following commands and answering questions appropriately, mild impulsiveness with standing but good use of RW until returning to sitting        General Comments General comments (skin integrity, edema, etc.): VSS on room air, pt reports feeling mildy fatigued after ambulation    Exercises     Assessment/Plan    PT Assessment Patient needs continued PT services  PT Problem List Decreased strength;Decreased activity  tolerance;Decreased balance;Decreased mobility;Decreased safety awareness;Decreased knowledge of precautions;Cardiopulmonary status limiting activity       PT Treatment Interventions Gait training;DME instruction;Functional mobility training;Therapeutic activities;Therapeutic exercise;Balance training;Neuromuscular re-education;Cognitive remediation;Patient/family education    PT Goals (Current goals can be found in the Care Plan section)  Acute Rehab PT Goals Patient Stated Goal: get better PT Goal Formulation: With patient Time For Goal Achievement: 06/29/22 Potential to Achieve Goals: Good    Frequency Min 3X/week     Co-evaluation               AM-PAC PT "6 Clicks" Mobility  Outcome Measure Help needed turning from your back to your side while in a flat bed without using bedrails?: A Little Help needed moving from lying on your back to sitting on the side of a flat bed without using bedrails?: A Little Help needed moving to and from a bed to a chair (including a wheelchair)?: A Little Help needed standing up from a chair using your arms (e.g., wheelchair or bedside chair)?: A Little Help needed to walk in hospital room?: A Little Help needed climbing 3-5 steps with a railing? : A Lot 6 Click Score: 17    End of Session Equipment Utilized During Treatment: Gait belt Activity Tolerance: Patient tolerated treatment well Patient left: in bed;with call bell/phone within  reach;with bed alarm set Nurse Communication: Mobility status PT Visit Diagnosis: Muscle weakness (generalized) (M62.81);History of falling (Z91.81);Difficulty in walking, not elsewhere classified (R26.2)    Time: 1028-1050 PT Time Calculation (min) (ACUTE ONLY): 22 min   Charges:   PT Evaluation $PT Eval Low Complexity: 1 Low          Lindalou Hose, PT DPT Acute Rehabilitation Services Office [phone]   Leonie Man 06/15/2022, 1:21 PM

## 2022-06-15 NOTE — Plan of Care (Signed)

## 2022-06-16 ENCOUNTER — Inpatient Hospital Stay (HOSPITAL_COMMUNITY): Payer: Medicaid Other

## 2022-06-16 DIAGNOSIS — J189 Pneumonia, unspecified organism: Secondary | ICD-10-CM | POA: Diagnosis not present

## 2022-06-16 LAB — PROCALCITONIN: Procalcitonin: 0.56 ng/mL

## 2022-06-16 LAB — CULTURE, BLOOD (ROUTINE X 2)
Culture: NO GROWTH
Special Requests: ADEQUATE

## 2022-06-16 LAB — CBC WITH DIFFERENTIAL/PLATELET
Abs Immature Granulocytes: 0 10*3/uL (ref 0.00–0.07)
Basophils Absolute: 0.1 10*3/uL (ref 0.0–0.1)
Basophils Relative: 1 %
Eosinophils Absolute: 0 10*3/uL (ref 0.0–0.5)
Eosinophils Relative: 0 %
HCT: 31.1 % — ABNORMAL LOW (ref 39.0–52.0)
Hemoglobin: 10.4 g/dL — ABNORMAL LOW (ref 13.0–17.0)
Lymphocytes Relative: 11 %
Lymphs Abs: 0.6 10*3/uL — ABNORMAL LOW (ref 0.7–4.0)
MCH: 31 pg (ref 26.0–34.0)
MCHC: 33.4 g/dL (ref 30.0–36.0)
MCV: 92.6 fL (ref 80.0–100.0)
Monocytes Absolute: 0.4 10*3/uL (ref 0.1–1.0)
Monocytes Relative: 8 %
Neutro Abs: 4.2 10*3/uL (ref 1.7–7.7)
Neutrophils Relative %: 80 %
Platelets: 402 10*3/uL — ABNORMAL HIGH (ref 150–400)
RBC: 3.36 MIL/uL — ABNORMAL LOW (ref 4.22–5.81)
RDW: 15.9 % — ABNORMAL HIGH (ref 11.5–15.5)
WBC: 5.3 10*3/uL (ref 4.0–10.5)
nRBC: 0 % (ref 0.0–0.2)
nRBC: 0 /100 WBC

## 2022-06-16 LAB — COMPREHENSIVE METABOLIC PANEL
ALT: 39 U/L (ref 0–44)
AST: 68 U/L — ABNORMAL HIGH (ref 15–41)
Albumin: 2.3 g/dL — ABNORMAL LOW (ref 3.5–5.0)
Alkaline Phosphatase: 76 U/L (ref 38–126)
Anion gap: 9 (ref 5–15)
BUN: <5 mg/dL — ABNORMAL LOW (ref 6–20)
CO2: 25 mmol/L (ref 22–32)
Calcium: 8.8 mg/dL — ABNORMAL LOW (ref 8.9–10.3)
Chloride: 100 mmol/L (ref 98–111)
Creatinine, Ser: 0.65 mg/dL (ref 0.61–1.24)
GFR, Estimated: >60 mL/min (ref >60)
Glucose, Bld: 122 mg/dL — ABNORMAL HIGH (ref 70–99)
Potassium: 3.4 mmol/L — ABNORMAL LOW (ref 3.5–5.1)
Sodium: 134 mmol/L — ABNORMAL LOW (ref 135–145)
Total Bilirubin: 0.6 mg/dL (ref 0.3–1.2)
Total Protein: 6.6 g/dL (ref 6.5–8.1)

## 2022-06-16 LAB — PHOSPHORUS: Phosphorus: 2.4 mg/dL — ABNORMAL LOW (ref 2.5–4.6)

## 2022-06-16 LAB — LIPASE, BLOOD: Lipase: 63 U/L — ABNORMAL HIGH (ref 11–51)

## 2022-06-16 LAB — BRAIN NATRIURETIC PEPTIDE: B Natriuretic Peptide: 130.1 pg/mL — ABNORMAL HIGH (ref 0.0–100.0)

## 2022-06-16 LAB — C-REACTIVE PROTEIN: CRP: 7.3 mg/dL — ABNORMAL HIGH (ref <1.0)

## 2022-06-16 LAB — MAGNESIUM: Magnesium: 1.4 mg/dL — ABNORMAL LOW (ref 1.7–2.4)

## 2022-06-16 MED ORDER — POTASSIUM CHLORIDE 2 MEQ/ML IV SOLN
INTRAVENOUS | Status: AC
  Filled 2022-06-16: qty 1000

## 2022-06-16 MED ORDER — CHLORDIAZEPOXIDE HCL 5 MG PO CAPS
5.0000 mg | ORAL_CAPSULE | Freq: Two times a day (BID) | ORAL | Status: DC | PRN
Start: 2022-06-17 — End: 2022-06-16

## 2022-06-16 MED ORDER — AMLODIPINE BESYLATE 10 MG PO TABS
10.0000 mg | ORAL_TABLET | Freq: Every day | ORAL | Status: DC
  Administered 2022-06-16 – 2022-06-17 (×2): 10 mg via ORAL
  Filled 2022-06-16 (×2): qty 1

## 2022-06-16 MED ORDER — MAGNESIUM SULFATE 4 GM/100ML IV SOLN
4.0000 g | Freq: Once | INTRAVENOUS | Status: AC
  Administered 2022-06-16: 4 g via INTRAVENOUS
  Filled 2022-06-16: qty 100

## 2022-06-16 MED ORDER — CHLORDIAZEPOXIDE HCL 25 MG PO CAPS
25.0000 mg | ORAL_CAPSULE | ORAL | Status: DC
Start: 2022-06-16 — End: 2022-06-16

## 2022-06-16 MED ORDER — CARVEDILOL 3.125 MG PO TABS
3.1250 mg | ORAL_TABLET | Freq: Two times a day (BID) | ORAL | Status: DC
  Administered 2022-06-16 – 2022-06-17 (×4): 3.125 mg via ORAL
  Filled 2022-06-16 (×4): qty 1

## 2022-06-16 MED ORDER — MAGNESIUM SULFATE 2 GM/50ML IV SOLN
2.0000 g | Freq: Once | INTRAVENOUS | Status: AC
  Administered 2022-06-16: 2 g via INTRAVENOUS
  Filled 2022-06-16: qty 50

## 2022-06-16 MED ORDER — SODIUM PHOSPHATES 45 MMOLE/15ML IV SOLN
30.0000 mmol | Freq: Once | INTRAVENOUS | Status: AC
  Administered 2022-06-16: 30 mmol via INTRAVENOUS
  Filled 2022-06-16: qty 10

## 2022-06-16 MED ORDER — CHLORDIAZEPOXIDE HCL 5 MG PO CAPS
10.0000 mg | ORAL_CAPSULE | Freq: Two times a day (BID) | ORAL | Status: DC
  Administered 2022-06-16: 10 mg via ORAL
  Filled 2022-06-16: qty 2

## 2022-06-16 NOTE — Progress Notes (Addendum)
PROGRESS NOTE                                                                                                                                                                                                             Patient Demographics:    Samuel Blevins, is a 57 y.o. male, DOB - [birth year], ZOX:[id number]  Outpatient Primary MD for the patient is [name], MD    LOS - 2  Admit date - 06/13/2022    Chief Complaint  Patient presents with   Weakness    Pneumonia symptoms        Brief Narrative (HPI from H&P)    57 year old African-American male, with history of alcohol abuse, chronic abdominal pain and chronic pancreatitis, patient lives in a shelter he is currently homeless, apparently for the last few days prior to admission he started having a fever came to the ER where he was diagnosed with aspiration pneumonia along with acute on chronic pancreatitis and admitted to the hospital.   Subjective:   Patient in bed, appears comfortable, denies any headache, no fever, no chest pain or pressure, no shortness of breath , no abdominal pain. No focal weakness.   Assessment  & Plan :    Acute on chronic alcoholic pancreatitis.  CT scan stable, continue to monitor lipase and clinically, still having abdominal pain, will titrate down his diet to soft on 06/15/2022, strictly counseled to abstain from alcohol.  Hydrated with IV fluids and monitor.  Now improving clinically on 06/16/2022.  Dehydration with hypokalemia, hypophosphatemia and hypomagnesemia.  IV fluids, replace oral and IV potassium, IV magnesium, IV phosphorus.  Monitor.   Aspiration pneumonia.  With history of alcohol abuse.  Speech input, Unasyn.  Follow cultures.  Acute metabolic encephalopathy due to combination of acute pancreatitis and possible early DTs.  Resolving.  No focal deficits.  No headache.  On CIWA protocol, Librium being tapered.  Continue to  monitor  Hypertension.  On Norvasc will increase the dose, add low-dose Coreg and continue as needed hydralazine  Alcohol abuse.  On CIWA protocol, strictly counseled to quit alcohol since last drink was around 1 week ago.       Condition -  Guarded  Family Communication  :  None   Code Status :  Full  Consults  :  None  PUD  Prophylaxis : PPI   Procedures  :      CT - 1. Improving groove pancreatitis. 2. Multifocal pneumonia. 3. Hepatic steatosis. Aortic Atherosclerosis      Disposition Plan  :    Status is: Inpatient   DVT Prophylaxis  :    enoxaparin (LOVENOX) injection 40 mg Start: 06/14/22 1000    Lab Results  Component Value Date   PLT 402 (H) 06/16/2022    Diet :  Diet Order             DIET SOFT Fluid consistency: Thin  Diet effective now                    Inpatient Medications  Scheduled Meds:  amLODipine  5 mg Oral Daily   chlordiazePOXIDE  10 mg Oral BID   enoxaparin (LOVENOX) injection  40 mg Subcutaneous Daily   folic acid  1 mg Oral Daily   lipase/protease/amylase  12,000 Units Oral TID AC   multivitamin with minerals  1 tablet Oral Daily   nicotine  14 mg Transdermal Daily   pantoprazole  40 mg Oral BID AC   sucralfate  1 g Oral TID WC & HS   thiamine  100 mg Oral Daily   Or   thiamine  100 mg Intravenous Daily   Continuous Infusions:  ampicillin-sulbactam (UNASYN) IV 3 g (06/16/22 0254)   lactated ringers 1,000 mL with potassium chloride 40 mEq infusion 75 mL/hr at 06/16/22 0641   magnesium sulfate bolus IVPB     magnesium sulfate bolus IVPB 4 g (06/16/22 0653)   sodium phosphate 30 mmol in dextrose 5 % 250 mL infusion     PRN Meds:.acetaminophen, hydrOXYzine, loperamide, LORazepam **OR** LORazepam     Objective:   Vitals:   06/15/22 1203 06/15/22 2000 06/16/22 0052 06/16/22 0441  BP: (!) 142/91 (!) 82/51 (!) 143/88 (!) 154/92  Pulse: 84  88 91  Resp: 18 16 17 17   Temp: 98.1 F (36.7 C) 98.2 F (36.8 C) 98 F  (36.7 C) 98.1 F (36.7 C)  TempSrc: Axillary Oral Oral Oral  SpO2: 96% 90% 91% 91%    Wt Readings from Last 3 Encounters:  06/11/22 78.8 kg  06/06/22 77.1 kg  05/22/22 77.1 kg     Intake/Output Summary (Last 24 hours) at 06/16/2022 0805 Last data filed at 06/16/2022 0540 Gross per 24 hour  Intake 1706.81 ml  Output 1100 ml  Net 606.81 ml     Physical Exam  Awake Alert, No new F.N deficits, Normal affect [place].AT,PERRAL Supple Neck, No JVD,   Symmetrical Chest wall movement, Good air movement bilaterally, CTAB RRR,No Gallops, Rubs or new Murmurs,  +ve B.Sounds, Abd Soft, No tenderness,   No Cyanosis, Clubbing or edema        Data Review:    Recent Labs  Lab 06/10/22 1145 06/11/22 0215 06/12/22 0217 06/13/22 2310 06/14/22 0406 06/15/22 0357 06/16/22 0340  WBC 14.1*   < > 9.8 10.4 7.4 5.4 5.3  HGB 10.1*   < > 10.9* 10.2* 10.1* 10.6* 10.4*  HCT 29.7*   < > 31.6* 30.3* 30.9* 31.8* 31.1*  PLT 105*   < > 165 272 267 333 402*  MCV 92.5   < > 91.9 92.9 93.9 92.2 92.6  MCH 31.5   < > 31.7 31.3 30.7 30.7 31.0  MCHC 34.0   < > 34.5 33.7 32.7 33.3 33.4  RDW 16.2*   < > 15.8* 15.7* 15.9* 15.6*  15.9*  LYMPHSABS 0.8  --   --  1.2 1.3 1.0 0.6*  MONOABS 0.1  --   --  2.0* 1.5* 0.5 0.4  EOSABS 0.0  --   --  0.0 0.0 0.3 0.0  BASOSABS 0.0  --   --  0.1 0.1 0.1 0.1   < > = values in this interval not displayed.    Recent Labs  Lab 06/09/22 2026 06/09/22 2138 06/10/22 0335 06/10/22 1043 06/10/22 1048 06/10/22 1632 06/10/22 1757 06/11/22 0215 06/12/22 0217 06/13/22 2310 06/14/22 0406 06/14/22 1801 06/15/22 0357 06/15/22 1507 06/16/22 0340  NA 136  --   --   --  129*  --   --  134*   < > 132* 133* 133* 130*  --  134*  K 3.2*  --   --   --  3.5  --   --  3.4*   < > 3.4* 3.0* 3.1* 2.9*  --  3.4*  CL 99  --   --   --  96*  --   --  98   < > 95* 98 99 99  --  100  CO2 22  --   --   --  21*  --   --  24   < > 17* 18* 20* 22  --  25  ANIONGAP 15  --   --   --  12  --    --  12   < > 20* 17* 14 9  --  9  GLUCOSE 93  --   --   --  102*  --   --  82   < > 73 73 107* 109*  --  122*  BUN 13  --   --   --  7  --   --  5*   < > 14 9 5* 5*  --  <5*  CREATININE 1.40*  --   --   --  0.75   < >  --  0.59*   < > 1.01 0.90 0.70 0.70  --  0.65  AST 102*  --   --   --  79*  --   --  87*  --  51* 43*  --   --   --  68*  ALT 32  --   --   --  26  --   --  30  --  28 24  --   --   --  39  ALKPHOS 117  --   --   --  90  --   --  81  --  84 77  --   --   --  76  BILITOT 1.0  --   --   --  1.4*  --   --  1.4*  --  2.1* 1.7*  --   --   --  0.6  ALBUMIN 3.6  --   --   --  2.9*  --   --  2.6*  --  3.0* 2.5*  --  2.4*  --  2.3*  CRP  --   --   --   --   --   --   --   --   --   --   --   --   --  10.1* 7.3*  PROCALCITON  --   --   --   --   --   --   --   --   --   --  1.02  --   --   --  0.56  LATICACIDVEN  --   --  2.1* 2.3*  --   --  1.9  --   --  0.8  --   --   --   --   --   INR  --   --   --   --   --   --   --   --   --  1.1  --   --   --   --   --   BNP 17.3  --   --   --   --   --   --   --   --   --   --   --   --   --  130.1*  MG  --  1.4*  --   --   --   --   --  1.9  --   --  1.4*  --  1.3*  --  1.4*  CALCIUM 8.7*  --   --   --  8.0*  --   --  8.3*   < > 9.5 8.9 8.4* 8.2*  --  8.8*   < > = values in this interval not displayed.      Recent Labs  Lab 06/09/22 2026 06/09/22 2138 06/10/22 0335 06/10/22 1043 06/10/22 1048 06/10/22 1757 06/11/22 0215 06/12/22 0217 06/13/22 2310 06/14/22 0406 06/14/22 1801 06/15/22 0357 06/15/22 1507 06/16/22 0340  CRP  --   --   --   --   --   --   --   --   --   --   --   --  10.1* 7.3*  PROCALCITON  --   --   --   --   --   --   --   --   --  1.02  --   --   --  0.56  LATICACIDVEN  --   --  2.1* 2.3*  --  1.9  --   --  0.8  --   --   --   --   --   INR  --   --   --   --   --   --   --   --  1.1  --   --   --   --   --   BNP 17.3  --   --   --   --   --   --   --   --   --   --   --   --  130.1*  MG  --  1.4*  --   --   --    --  1.9  --   --  1.4*  --  1.3*  --  1.4*  CALCIUM 8.7*  --   --   --    < >  --  8.3*   < > 9.5 8.9 8.4* 8.2*  --  8.8*   < > = values in this interval not displayed.       Radiology Reports DG Abd Portable 1V  Result Date: 06/15/2022 CLINICAL DATA:  Abdominal pain EXAM: PORTABLE ABDOMEN - 1 VIEW COMPARISON:  06/14/2022 CT FINDINGS: Scattered large and small bowel gas is noted. No obstructive changes are seen. No free air is noted. No acute bony abnormality is seen. IMPRESSION: No acute abnormality noted. Should be noted the initial film is mismarked  and corrected on the second film. Electronically Signed   By: Alcide Clever M.D.   On: 06/15/2022 09:46   CT ABDOMEN PELVIS W CONTRAST  Result Date: 06/14/2022 CLINICAL DATA:  Discharged yesterday for pneumonia. Very weak. Abdominal pain with some diffuse tenderness to palpation and right upper quadrant pain EXAM: CT ABDOMEN AND PELVIS WITH CONTRAST TECHNIQUE: Multidetector CT imaging of the abdomen and pelvis was performed using the standard protocol following bolus administration of intravenous contrast. RADIATION DOSE REDUCTION: This exam was performed according to the departmental dose-optimization program which includes automated exposure control, adjustment of the mA and/or kV according to patient size and/or use of iterative reconstruction technique. CONTRAST:  75mL OMNIPAQUE IOHEXOL 350 MG/ML SOLN COMPARISON:  CT chest abdomen and pelvis 06/10/2022 FINDINGS: Lower chest: Bilateral airspace opacities in the lower lungs compatible with multifocal pneumonia. Hepatobiliary: Hepatic steatosis. The gallbladder is decompressed. No biliary dilation. Pancreas: Similar to slightly decreased fluid and inflammatory changes along the pancreatico duodenal groove (series 3/image 37-40). No organized fluid collection. No pancreatic necrosis the portal vein is patent. Spleen: Unremarkable. Adrenals/Urinary Tract: Normal adrenal glands. No urinary calculi or  hydronephrosis. Nondistended bladder. Stomach/Bowel: Normal caliber large and small bowel. Normal appendix. Mild wall thickening of the duodenum in the region of the pancreatic head. Stomach is within normal limits. Vascular/Lymphatic: Aortic atherosclerotic calcification. No lymphadenopathy. Reproductive: Unremarkable. Other: No free intraperitoneal air. Musculoskeletal: No acute osseous abnormality. IMPRESSION: 1. Improving groove pancreatitis. 2. Multifocal pneumonia. 3. Hepatic steatosis. Aortic Atherosclerosis (ICD10-I70.0). Electronically Signed   By: Minerva Fester M.D.   On: 06/14/2022 01:27   DG Chest Port 1 View  Result Date: 06/13/2022 CLINICAL DATA:  Questionable sepsis - evaluate for abnormality EXAM: PORTABLE CHEST 1 VIEW COMPARISON:  CT angio chest 06/10/2022, chest x-ray 06/09/2022 FINDINGS: The heart and mediastinal contours are unchanged. Aortic calcification. Persistent right base and right middle lobe consolidation. No pulmonary edema. No pleural effusion. No pneumothorax. No acute osseous abnormality. IMPRESSION: 1. Persistent right base and right middle lobe infection. Followup PA and lateral chest X-ray is recommended in 3-4 weeks following therapy to ensure resolution. 2.  Aortic Atherosclerosis (ICD10-I70.0). Electronically Signed   By: Tish Frederickson M.D.   On: 06/13/2022 22:51      Signature  -   Susa Raring M.D on 06/16/2022 at 8:05 AM   -  To page go to [email or web address]

## 2022-06-16 NOTE — Plan of Care (Signed)

## 2022-06-17 DIAGNOSIS — J189 Pneumonia, unspecified organism: Secondary | ICD-10-CM | POA: Diagnosis not present

## 2022-06-17 LAB — COMPREHENSIVE METABOLIC PANEL
ALT: 43 U/L (ref 0–44)
AST: 78 U/L — ABNORMAL HIGH (ref 15–41)
Albumin: 2.3 g/dL — ABNORMAL LOW (ref 3.5–5.0)
Alkaline Phosphatase: 82 U/L (ref 38–126)
Anion gap: 8 (ref 5–15)
BUN: <5 mg/dL — ABNORMAL LOW (ref 6–20)
CO2: 25 mmol/L (ref 22–32)
Calcium: 8.6 mg/dL — ABNORMAL LOW (ref 8.9–10.3)
Chloride: 102 mmol/L (ref 98–111)
Creatinine, Ser: 0.62 mg/dL (ref 0.61–1.24)
GFR, Estimated: >60 mL/min (ref >60)
Glucose, Bld: 126 mg/dL — ABNORMAL HIGH (ref 70–99)
Potassium: 3.2 mmol/L — ABNORMAL LOW (ref 3.5–5.1)
Sodium: 135 mmol/L (ref 135–145)
Total Bilirubin: 0.5 mg/dL (ref 0.3–1.2)
Total Protein: 6.8 g/dL (ref 6.5–8.1)

## 2022-06-17 LAB — CBC WITH DIFFERENTIAL/PLATELET
Abs Immature Granulocytes: 0 10*3/uL (ref 0.00–0.07)
Basophils Absolute: 0 10*3/uL (ref 0.0–0.1)
Basophils Relative: 0 %
Eosinophils Absolute: 0.2 10*3/uL (ref 0.0–0.5)
Eosinophils Relative: 3 %
HCT: 29.3 % — ABNORMAL LOW (ref 39.0–52.0)
Hemoglobin: 10 g/dL — ABNORMAL LOW (ref 13.0–17.0)
Lymphocytes Relative: 14 %
Lymphs Abs: 0.7 10*3/uL (ref 0.7–4.0)
MCH: 31.3 pg (ref 26.0–34.0)
MCHC: 34.1 g/dL (ref 30.0–36.0)
MCV: 91.8 fL (ref 80.0–100.0)
Monocytes Absolute: 0.4 10*3/uL (ref 0.1–1.0)
Monocytes Relative: 7 %
Neutro Abs: 4 10*3/uL (ref 1.7–7.7)
Neutrophils Relative %: 76 %
Platelets: 479 10*3/uL — ABNORMAL HIGH (ref 150–400)
RBC: 3.19 MIL/uL — ABNORMAL LOW (ref 4.22–5.81)
RDW: 16.2 % — ABNORMAL HIGH (ref 11.5–15.5)
WBC: 5.2 10*3/uL (ref 4.0–10.5)
nRBC: 0 % (ref 0.0–0.2)
nRBC: 0 /100 WBC

## 2022-06-17 LAB — MAGNESIUM: Magnesium: 1.7 mg/dL (ref 1.7–2.4)

## 2022-06-17 LAB — C-REACTIVE PROTEIN: CRP: 5.3 mg/dL — ABNORMAL HIGH (ref <1.0)

## 2022-06-17 LAB — PROCALCITONIN: Procalcitonin: 0.37 ng/mL

## 2022-06-17 LAB — BRAIN NATRIURETIC PEPTIDE: B Natriuretic Peptide: 115.9 pg/mL — ABNORMAL HIGH (ref 0.0–100.0)

## 2022-06-17 LAB — PHOSPHORUS: Phosphorus: 2.9 mg/dL (ref 2.5–4.6)

## 2022-06-17 MED ORDER — POTASSIUM CHLORIDE CRYS ER 20 MEQ PO TBCR
40.0000 meq | EXTENDED_RELEASE_TABLET | Freq: Once | ORAL | Status: AC
  Administered 2022-06-17: 40 meq via ORAL
  Filled 2022-06-17: qty 2

## 2022-06-17 MED ORDER — CHLORDIAZEPOXIDE HCL 5 MG PO CAPS
5.0000 mg | ORAL_CAPSULE | Freq: Two times a day (BID) | ORAL | Status: AC
  Administered 2022-06-17 (×2): 5 mg via ORAL
  Filled 2022-06-17 (×2): qty 1

## 2022-06-17 MED ORDER — LOPERAMIDE HCL 2 MG PO CAPS: 2.0000 mg | ORAL_CAPSULE | ORAL | Status: DC | PRN

## 2022-06-17 MED ORDER — POTASSIUM CHLORIDE CRYS ER 20 MEQ PO TBCR
20.0000 meq | EXTENDED_RELEASE_TABLET | Freq: Once | ORAL | Status: AC
  Administered 2022-06-17: 20 meq via ORAL
  Filled 2022-06-17: qty 1

## 2022-06-17 MED ORDER — LORAZEPAM 2 MG/ML PO CONC: 1.0000 mg | ORAL | Status: DC | PRN

## 2022-06-17 MED ORDER — LORAZEPAM 1 MG PO TABS
1.0000 mg | ORAL_TABLET | ORAL | Status: DC | PRN
  Administered 2022-06-17: 1 mg via ORAL
  Filled 2022-06-17: qty 1

## 2022-06-17 MED ORDER — MAGNESIUM SULFATE 2 GM/50ML IV SOLN
2.0000 g | Freq: Once | INTRAVENOUS | Status: AC
  Administered 2022-06-17: 2 g via INTRAVENOUS
  Filled 2022-06-17: qty 50

## 2022-06-17 NOTE — Progress Notes (Signed)
PROGRESS NOTE                                                                                                                                                                                                             Patient Demographics:    Samuel Blevins, is a 57 y.o. male, DOB - [birth year], ZOX:[id number]  Outpatient Primary MD for the patient is [name], MD    LOS - 3  Admit date - 06/13/2022    Chief Complaint  Patient presents with   Weakness    Pneumonia symptoms        Brief Narrative (HPI from H&P)    57 year old African-American male, with history of alcohol abuse, chronic abdominal pain and chronic pancreatitis, patient lives in a shelter he is currently homeless, apparently for the last few days prior to admission he started having a fever came to the ER where he was diagnosed with aspiration pneumonia along with acute on chronic pancreatitis and admitted to the hospital.   Subjective:   Patient in bed, appears comfortable, denies any headache, no fever, no chest pain or pressure, no shortness of breath , no abdominal pain. No new focal weakness.    Assessment  & Plan :   Acute on chronic alcoholic pancreatitis.  CT scan stable, stable triglycerides & LFTs, he was treated conservatively with improvement, gently advance diet, strictly counseled to abstain from alcohol.  Hydrated with IV fluids and monitor.  Now improving clinically on 06/16/2022.  Dehydration with hypokalemia, hypophosphatemia and hypomagnesemia.  IV fluids, replace oral and IV potassium, IV magnesium, IV phosphorus.  Monitor.   Aspiration pneumonia.  With history of alcohol abuse.  Speech input, Unasyn x 5.  Follow cultures.  Acute metabolic encephalopathy due to combination of acute pancreatitis and possible early DTs.  Resolving.  No focal deficits.  No headache.  On CIWA protocol, Librium being tapered.  Continue to  monitor  Hypertension.  On Norvasc will increase the dose, add low-dose Coreg and continue as needed hydralazine  Alcohol abuse.  On CIWA protocol, strictly counseled to quit alcohol since last drink was around 1 week ago.       Condition -  Guarded  Family Communication  :  None   Code Status :  Full  Consults  :  None  PUD Prophylaxis : PPI  Procedures  :      CT - 1. Improving groove pancreatitis. 2. Multifocal pneumonia. 3. Hepatic steatosis. Aortic Atherosclerosis      Disposition Plan  :    Status is: Inpatient   DVT Prophylaxis  :    enoxaparin (LOVENOX) injection 40 mg Start: 06/14/22 1000    Lab Results  Component Value Date   PLT 479 (H) 06/17/2022    Diet :  Diet Order             DIET SOFT Fluid consistency: Thin  Diet effective now                    Inpatient Medications  Scheduled Meds:  amLODipine  10 mg Oral Daily   carvedilol  3.125 mg Oral BID WC   chlordiazePOXIDE  5 mg Oral BID   enoxaparin (LOVENOX) injection  40 mg Subcutaneous Daily   folic acid  1 mg Oral Daily   lipase/protease/amylase  12,000 Units Oral TID AC   multivitamin with minerals  1 tablet Oral Daily   nicotine  14 mg Transdermal Daily   pantoprazole  40 mg Oral BID AC   potassium chloride  20 mEq Oral Once   sucralfate  1 g Oral TID WC & HS   thiamine  100 mg Oral Daily   Continuous Infusions:  ampicillin-sulbactam (UNASYN) IV 3 g (06/17/22 0144)   magnesium sulfate bolus IVPB 2 g (06/17/22 0700)   PRN Meds:.acetaminophen, loperamide     Objective:   Vitals:   06/16/22 1810 06/16/22 2000 06/17/22 0000 06/17/22 0400  BP: (!) 144/87 131/87 (!) 146/88 (!) 143/80  Pulse: 88 86    Resp:  19 18 20   Temp:  98.3 F (36.8 C) 98.1 F (36.7 C) 98 F (36.7 C)  TempSrc:  Oral Oral Oral  SpO2:  94%  95%    Wt Readings from Last 3 Encounters:  06/11/22 78.8 kg  06/06/22 77.1 kg  05/22/22 77.1 kg     Intake/Output Summary (Last 24 hours) at  06/17/2022 0756 Last data filed at 06/17/2022 0528 Gross per 24 hour  Intake 640 ml  Output 2450 ml  Net -1810 ml     Physical Exam  Awake Alert, No new F.N deficits, Normal affect [place].AT,PERRAL Supple Neck, No JVD,   Symmetrical Chest wall movement, Good air movement bilaterally, CTAB RRR,No Gallops, Rubs or new Murmurs,  +ve B.Sounds, Abd Soft, No tenderness,   No Cyanosis, Clubbing or edema        Data Review:    Recent Labs  Lab 06/13/22 2310 06/14/22 0406 06/15/22 0357 06/16/22 0340 06/17/22 0431  WBC 10.4 7.4 5.4 5.3 5.2  HGB 10.2* 10.1* 10.6* 10.4* 10.0*  HCT 30.3* 30.9* 31.8* 31.1* 29.3*  PLT 272 267 333 402* 479*  MCV 92.9 93.9 92.2 92.6 91.8  MCH 31.3 30.7 30.7 31.0 31.3  MCHC 33.7 32.7 33.3 33.4 34.1  RDW 15.7* 15.9* 15.6* 15.9* 16.2*  LYMPHSABS 1.2 1.3 1.0 0.6* 0.7  MONOABS 2.0* 1.5* 0.5 0.4 0.4  EOSABS 0.0 0.0 0.3 0.0 0.2  BASOSABS 0.1 0.1 0.1 0.1 0.0    Recent Labs  Lab 06/10/22 1043 06/10/22 1048 06/10/22 1757 06/11/22 0215 06/12/22 0217 06/13/22 2310 06/14/22 0406 06/14/22 1801 06/15/22 0357 06/15/22 1507 06/16/22 0340 06/17/22 0431  NA  --    < >  --  134*   < > 132* 133* 133* 130*  --  134* 135  K  --    < >  --  3.4*   < > 3.4* 3.0* 3.1* 2.9*  --  3.4* 3.2*  CL  --    < >  --  98   < > 95* 98 99 99  --  100 102  CO2  --    < >  --  24   < > 17* 18* 20* 22  --  25 25  ANIONGAP  --    < >  --  12   < > 20* 17* 14 9  --  9 8  GLUCOSE  --    < >  --  82   < > 73 73 107* 109*  --  122* 126*  BUN  --    < >  --  5*   < > 14 9 5* 5*  --  <5* <5*  CREATININE  --    < >  --  0.59*   < > 1.01 0.90 0.70 0.70  --  0.65 0.62  AST  --    < >  --  87*  --  51* 43*  --   --   --  68* 78*  ALT  --    < >  --  30  --  28 24  --   --   --  39 43  ALKPHOS  --    < >  --  81  --  84 77  --   --   --  76 82  BILITOT  --    < >  --  1.4*  --  2.1* 1.7*  --   --   --  0.6 0.5  ALBUMIN  --    < >  --  2.6*  --  3.0* 2.5*  --  2.4*  --  2.3* 2.3*  CRP  --    --   --   --   --   --   --   --   --  10.1* 7.3* 5.3*  PROCALCITON  --   --   --   --   --   --  1.02  --   --   --  0.56 0.37  LATICACIDVEN 2.3*  --  1.9  --   --  0.8  --   --   --   --   --   --   INR  --   --   --   --   --  1.1  --   --   --   --   --   --   BNP  --   --   --   --   --   --   --   --   --   --  130.1* 115.9*  MG  --   --   --  1.9  --   --  1.4*  --  1.3*  --  1.4* 1.7  CALCIUM  --    < >  --  8.3*   < > 9.5 8.9 8.4* 8.2*  --  8.8* 8.6*   < > = values in this interval not displayed.    Radiology Reports DG Chest Port 1 View  Result Date: 06/16/2022 CLINICAL DATA:  57 year old male with history of shortness of breath. EXAM: PORTABLE CHEST 1 VIEW COMPARISON:  Chest x-ray 06/13/2022. FINDINGS: Opacity in the right base obscuring the right hemidiaphragm, concerning for right lower lobe pneumonia. Ill-defined opacity also noted  in the medial aspect of the left lung base. Small right pleural effusion. No left pleural effusion. No pneumothorax. No evidence of pulmonary edema. Heart size appears borderline enlarged. The patient is rotated to the right on today's exam, resulting in distortion of the mediastinal contours and reduced diagnostic sensitivity and specificity for mediastinal pathology. IMPRESSION: 1. Right lower lobe pneumonia. 2. Additional area of atelectasis and/or consolidation in the left lower lobe medially. 3. Small right pleural effusion. Electronically Signed   By: Trudie Reed M.D.   On: 06/16/2022 09:15   DG Abd Portable 1V  Result Date: 06/15/2022 CLINICAL DATA:  Abdominal pain EXAM: PORTABLE ABDOMEN - 1 VIEW COMPARISON:  06/14/2022 CT FINDINGS: Scattered large and small bowel gas is noted. No obstructive changes are seen. No free air is noted. No acute bony abnormality is seen. IMPRESSION: No acute abnormality noted. Should be noted the initial film is mismarked and corrected on the second film. Electronically Signed   By: Alcide Clever M.D.   On:  06/15/2022 09:46   CT ABDOMEN PELVIS W CONTRAST  Result Date: 06/14/2022 CLINICAL DATA:  Discharged yesterday for pneumonia. Very weak. Abdominal pain with some diffuse tenderness to palpation and right upper quadrant pain EXAM: CT ABDOMEN AND PELVIS WITH CONTRAST TECHNIQUE: Multidetector CT imaging of the abdomen and pelvis was performed using the standard protocol following bolus administration of intravenous contrast. RADIATION DOSE REDUCTION: This exam was performed according to the departmental dose-optimization program which includes automated exposure control, adjustment of the mA and/or kV according to patient size and/or use of iterative reconstruction technique. CONTRAST:  75mL OMNIPAQUE IOHEXOL 350 MG/ML SOLN COMPARISON:  CT chest abdomen and pelvis 06/10/2022 FINDINGS: Lower chest: Bilateral airspace opacities in the lower lungs compatible with multifocal pneumonia. Hepatobiliary: Hepatic steatosis. The gallbladder is decompressed. No biliary dilation. Pancreas: Similar to slightly decreased fluid and inflammatory changes along the pancreatico duodenal groove (series 3/image 37-40). No organized fluid collection. No pancreatic necrosis the portal vein is patent. Spleen: Unremarkable. Adrenals/Urinary Tract: Normal adrenal glands. No urinary calculi or hydronephrosis. Nondistended bladder. Stomach/Bowel: Normal caliber large and small bowel. Normal appendix. Mild wall thickening of the duodenum in the region of the pancreatic head. Stomach is within normal limits. Vascular/Lymphatic: Aortic atherosclerotic calcification. No lymphadenopathy. Reproductive: Unremarkable. Other: No free intraperitoneal air. Musculoskeletal: No acute osseous abnormality. IMPRESSION: 1. Improving groove pancreatitis. 2. Multifocal pneumonia. 3. Hepatic steatosis. Aortic Atherosclerosis (ICD10-I70.0). Electronically Signed   By: Minerva Fester M.D.   On: 06/14/2022 01:27   DG Chest Port 1 View  Result Date:  06/13/2022 CLINICAL DATA:  Questionable sepsis - evaluate for abnormality EXAM: PORTABLE CHEST 1 VIEW COMPARISON:  CT angio chest 06/10/2022, chest x-ray 06/09/2022 FINDINGS: The heart and mediastinal contours are unchanged. Aortic calcification. Persistent right base and right middle lobe consolidation. No pulmonary edema. No pleural effusion. No pneumothorax. No acute osseous abnormality. IMPRESSION: 1. Persistent right base and right middle lobe infection. Followup PA and lateral chest X-ray is recommended in 3-4 weeks following therapy to ensure resolution. 2.  Aortic Atherosclerosis (ICD10-I70.0). Electronically Signed   By: Tish Frederickson M.D.   On: 06/13/2022 22:51      Signature  -   Susa Raring M.D on 06/17/2022 at 7:56 AM   -  To page go to [email or web address]

## 2022-06-17 NOTE — Plan of Care (Signed)

## 2022-06-17 NOTE — Evaluation (Signed)
Occupational Therapy Evaluation Patient Details Name: Samuel Blevins MRN: [id number] DOB: 06-Aug-1965 Today's Date: 06/17/2022   History of Present Illness 57 y/o M admitted to [facility] from [facility] on 4/18 for weakness and AMS. CT of abdomen showing improving groove pancreatitis, multifocal PNA, and hepatic steatosis. Recent admission 4/14-4/17 for CAP. PMHx: alcohol abuse, homelessness, chronic abd pain, chronic pancreatitis, chronic anemia, HTN.   Clinical Impression   Pt seated EOB having pulled out IV and having BM on floor. Assisted to clean up floor, bathe and dress pt. Pt requires set up to supervision. Pt highly focused on needing find his wallet, RN aware. Pt plans to discharge back to homeless shelter. He is typically independent in self care and walks with a rollator.      Recommendations for follow up therapy are one component of a multi-disciplinary discharge planning process, led by the attending physician.  Recommendations may be updated based on patient status, additional functional criteria and insurance authorization.   Assistance Recommended at Discharge PRN  Patient can return home with the following Assist for transportation;Assistance with cooking/housework;Direct supervision/assist for medications management;Direct supervision/assist for financial management    Functional Status Assessment  Patient has had a recent decline in their functional status and demonstrates the ability to make significant improvements in function in a reasonable and predictable amount of time.  Equipment Recommendations  None recommended by OT    Recommendations for Other Services       Precautions / Restrictions Precautions Precautions: Fall Restrictions Weight Bearing Restrictions: No      Mobility Bed Mobility Overal bed mobility: Modified Independent                  Transfers Overall transfer level: Needs assistance Equipment used: None Transfers: Sit to/from  Stand Sit to Stand: Supervision           General transfer comment: stood for pericare and to walk around bed to recliner touching footboard      Balance Overall balance assessment: Needs assistance   Sitting balance-Leahy Scale: Good       Standing balance-Leahy Scale: Fair Standing balance comment: fair static, needs at least one hand to balance with dynamic balance                           ADL either performed or assessed with clinical judgement   ADL Overall ADL's : Needs assistance/impaired Eating/Feeding: Independent;Bed level   Grooming: Wash/dry hands;Wash/dry face;Sitting;Set up   Upper Body Bathing: Set up;Sitting   Lower Body Bathing: Supervison/ safety;Sit to/from stand   Upper Body Dressing : Set up;Sitting   Lower Body Dressing: Set up;Sitting/lateral leans       Toileting- Clothing Manipulation and Hygiene: Supervision/safety;Sit to/from stand       Functional mobility during ADLs: Supervision/safety       Vision Baseline Vision/History: 0 No visual deficits       Perception     Praxis      Pertinent Vitals/Pain Pain Assessment Pain Assessment: No/denies pain     Hand Dominance Right   Extremity/Trunk Assessment Upper Extremity Assessment Upper Extremity Assessment: Overall WFL for tasks assessed       Cervical / Trunk Assessment Cervical / Trunk Assessment: Kyphotic   Communication Communication Communication: No difficulties   Cognition Arousal/Alertness: Awake/alert Behavior During Therapy: Flat affect Overall Cognitive Status: No family/caregiver present to determine baseline cognitive functioning  Safety/Judgement: Decreased awareness of safety, Decreased awareness of deficits   Problem Solving: Slow processing General Comments: pt with impulsivity, highly focuse on his lost wallet     General Comments       Exercises     Shoulder Instructions      Home  Living Family/patient expects to be discharged to:: Shelter/Homeless                                 Additional Comments: Was staying a [employer], plans to return at discharge. Has rollator and reports SPC as well, stays on first level and has elevator access if needing to go upstairs      Prior Functioning/Environment Prior Level of Function : Needs assist             Mobility Comments: ambulatory with rollator at baseline, but reports recent weakness with 2 falls on day of admission ADLs Comments: modified independent        OT Problem List: Impaired balance (sitting and/or standing);Decreased cognition;Decreased safety awareness      OT Treatment/Interventions: Self-care/ADL training;DME and/or AE instruction;Therapeutic activities;Balance training;Patient/family education;Cognitive remediation/compensation    OT Goals(Current goals can be found in the care plan section) Acute Rehab OT Goals OT Goal Formulation: With patient Time For Goal Achievement: 07/01/22 Potential to Achieve Goals: Good ADL Goals Pt Will Transfer to Toilet: with modified independence;regular height toilet Pt Will Perform Toileting - Clothing Manipulation and hygiene: with modified independence;sit to/from stand Additional ADL Goal #1: Pt will complete basic ADLs modified independently. Additional ADL Goal #2: Pt will independently manage medications.  OT Frequency: Min 2X/week    Co-evaluation              AM-PAC OT "6 Clicks" Daily Activity     Outcome Measure Help from another person eating meals?: None Help from another person taking care of personal grooming?: A Little Help from another person toileting, which includes using toliet, bedpan, or urinal?: A Little Help from another person bathing (including washing, rinsing, drying)?: A Little Help from another person to put on and taking off regular upper body clothing?: A Little Help from another person to put on and  taking off regular lower body clothing?: A Little 6 Click Score: 19   End of Session Nurse Communication: Other (comment) (still looking for wallet, wants to leave)  Activity Tolerance: Patient tolerated treatment well Patient left:    OT Visit Diagnosis: Unsteadiness on feet (R26.81);Other abnormalities of gait and mobility (R26.89);Muscle weakness (generalized) (M62.81);Other symptoms and signs involving cognitive function                Time: 8119-1478 OT Time Calculation (min): 24 min Charges:  OT General Charges $OT Visit: 1 Visit OT Evaluation $OT Eval Moderate Complexity: 1 Mod OT Treatments $Self Care/Home Management : 8-22 mins  Berna Spare, OTR/L Acute Rehabilitation Services Office: [phone]   Evern Bio 06/17/2022, 11:01 AM

## 2022-06-17 NOTE — Progress Notes (Signed)
Today at approximately 1015 am, pt was verbally abusive to the nursing tech Bre, by calling her a "bitch", after she didn't come to the pt's room in time to help him get unhooked from the IV pump, because he had to go to the bathroom. The tech Bre told this Clinical research associate, primary RN Marimar Suber, the pt said, "if no one comes to help me go to the bathroom, I will poop on the floor", when he used the call light system. Pt had a bowel movement on the room floor, and the physical therapist helped the pt clean it up off the floor.   This Clinical research associate notified the charge RN Desiray, and the attending physician Susa Raring, MD of the pt's behavior, as well as this writer speaking to pt of his behavior as well.

## 2022-06-18 ENCOUNTER — Other Ambulatory Visit (HOSPITAL_COMMUNITY): Payer: Self-pay

## 2022-06-18 DIAGNOSIS — J189 Pneumonia, unspecified organism: Secondary | ICD-10-CM | POA: Diagnosis not present

## 2022-06-18 LAB — COMPREHENSIVE METABOLIC PANEL
ALT: 70 U/L — ABNORMAL HIGH (ref 0–44)
AST: 134 U/L — ABNORMAL HIGH (ref 15–41)
Albumin: 2.9 g/dL — ABNORMAL LOW (ref 3.5–5.0)
Alkaline Phosphatase: 101 U/L (ref 38–126)
Anion gap: 9 (ref 5–15)
BUN: 5 mg/dL — ABNORMAL LOW (ref 6–20)
CO2: 24 mmol/L (ref 22–32)
Calcium: 9.2 mg/dL (ref 8.9–10.3)
Chloride: 100 mmol/L (ref 98–111)
Creatinine, Ser: 0.65 mg/dL (ref 0.61–1.24)
GFR, Estimated: >60 mL/min (ref >60)
Glucose, Bld: 104 mg/dL — ABNORMAL HIGH (ref 70–99)
Potassium: 3.8 mmol/L (ref 3.5–5.1)
Sodium: 133 mmol/L — ABNORMAL LOW (ref 135–145)
Total Bilirubin: 0.7 mg/dL (ref 0.3–1.2)
Total Protein: 8 g/dL (ref 6.5–8.1)

## 2022-06-18 LAB — CULTURE, BLOOD (ROUTINE X 2): Culture: NO GROWTH

## 2022-06-18 LAB — MAGNESIUM: Magnesium: 1.7 mg/dL (ref 1.7–2.4)

## 2022-06-18 MED ORDER — MAGNESIUM SULFATE 2 GM/50ML IV SOLN
2.0000 g | Freq: Once | INTRAVENOUS | Status: AC
  Administered 2022-06-18: 2 g via INTRAVENOUS
  Filled 2022-06-18: qty 50

## 2022-06-18 MED ORDER — CARVEDILOL 3.125 MG PO TABS
3.1250 mg | ORAL_TABLET | Freq: Two times a day (BID) | ORAL | 0 refills | Status: DC
  Filled 2022-06-18: qty 60, 30d supply, fill #0

## 2022-06-18 MED ORDER — NICOTINE 14 MG/24HR TD PT24
14.0000 mg | MEDICATED_PATCH | Freq: Every day | TRANSDERMAL | 0 refills | Status: DC
  Filled 2022-06-18: qty 28, 28d supply, fill #0

## 2022-06-18 MED ORDER — AMOXICILLIN-POT CLAVULANATE 875-125 MG PO TABS
1.0000 | ORAL_TABLET | Freq: Two times a day (BID) | ORAL | 0 refills | Status: AC
  Filled 2022-06-18: qty 4, 2d supply, fill #0

## 2022-06-18 MED ORDER — THIAMINE HCL 100 MG PO TABS
100.0000 mg | ORAL_TABLET | Freq: Every day | ORAL | 0 refills | Status: DC
  Filled 2022-06-18: qty 30, 30d supply, fill #0

## 2022-06-18 NOTE — Discharge Instructions (Signed)
Follow with Primary MD [name], MD in 7 days   Get CBC, CMP, Magnesium, 2 view Chest X ray -  checked next visit with your primary MD   Activity: As tolerated with Full fall precautions use walker/cane & assistance as needed  Disposition Home    Diet: Heart Healthy    Special Instructions: If you have smoked or chewed Tobacco  in the last 2 yrs please stop smoking, stop any regular Alcohol  and or any Recreational drug use.  On your next visit with your primary care physician please Get Medicines reviewed and adjusted.  Please request your Prim.MD to go over all Hospital Tests and Procedure/Radiological results at the follow up, please get all Hospital records sent to your Prim MD by signing hospital release before you go home.  If you experience worsening of your admission symptoms, develop shortness of breath, life threatening emergency, suicidal or homicidal thoughts you must seek medical attention immediately by calling 911 or calling your MD immediately  if symptoms less severe.  You Must read complete instructions/literature along with all the possible adverse reactions/side effects for all the Medicines you take and that have been prescribed to you. Take any new Medicines after you have completely understood and accpet all the possible adverse reactions/side effects.

## 2022-06-18 NOTE — Discharge Summary (Signed)
Samuel Blevins ZOX:[id number] DOB: April 29, 1965 DOA: 06/13/2022  PCP: [name], MD  Admit date: 06/13/2022  Discharge date: 06/18/2022  Admitted From: Home   Disposition:  Home   Recommendations for Outpatient Follow-up:   Follow up with PCP in 1-2 weeks  PCP Please obtain BMP/CBC, 2 view CXR in 1week,  (see Discharge instructions)   PCP Please follow up on the following pending results:    Home Health: None   Equipment/Devices: None  Consultations: None  Discharge Condition: Stable    CODE STATUS: Full    Diet Recommendation: Heart Healthy      Chief Complaint  Patient presents with   Weakness    Pneumonia symptoms      Brief history of present illness from the day of admission and additional interim summary     57 year old African-American male, with history of alcohol abuse, chronic abdominal pain and chronic pancreatitis, patient lives in a shelter he is currently homeless, apparently for the last few days prior to admission he started having a fever came to the ER where he was diagnosed with aspiration pneumonia along with acute on chronic pancreatitis and admitted to the hospital.                                                                  Hospital Course    Expand All Collapse All                                                                     PROGRESS NOTE  Patient Demographics:     Samuel Blevins, is a 57 y.o. male, DOB - [birth year], WUJ:[id number]   Outpatient Primary MD for the patient is [name], MD    LOS - 3  Admit date - 06/13/2022         Chief Complaint   Patient presents with   Weakness      Pneumonia symptoms         Brief Narrative (HPI from H&P)    57 year old African-American male, with history of alcohol abuse, chronic abdominal pain and chronic pancreatitis, patient lives in a shelter he is currently homeless, apparently for the last few days prior to admission he started having a fever came to the ER where he was diagnosed with aspiration pneumonia along with acute on chronic pancreatitis and admitted to the hospital.    Subjective:    Patient in bed, appears comfortable, denies any headache, no fever, no chest pain or pressure, no shortness of breath , no abdominal pain. No new focal weakness.      Assessment  & Plan :    Acute on chronic alcoholic pancreatitis.  CT scan stable, stable triglycerides & LFTs, he was treated conservatively with improvement, gently advance diet, strictly counseled to abstain from alcohol.,  Completely symptom-free now eager to go home, not motivated to quit alcohol unfortunately.   Dehydration with hypokalemia, hypophosphatemia and hypomagnesemia.  IV fluids, replace oral and IV potassium, IV magnesium, IV phosphorus.  Stable.  PCP to monitor intermittently.   Aspiration pneumonia.  With history of alcohol abuse, seen by speech placed on Unasyn here will get 2 more days of oral Augmentin.  Again counseled to quit alcohol abuse.   Acute metabolic encephalopathy due to combination of acute pancreatitis and possible early DTs.  Resolved with supportive care still anxious to leave I have a suspicion he has going to go out and consume alcohol again, have strictly counseled him against it.   Hypertension.  On Norvasc, Coreg added for better control.   Alcohol abuse.  On CIWA protocol, strictly counseled to quit alcohol since last drink was around 1 week ago.  Currently no signs of DTs but not motivated to get any help inpatient or outpatient.         Discharge diagnosis     Principal Problem:    Multifocal pneumonia Active Problems:   Chronic alcohol abuse   Homeless   Essential hypertension   Alcohol withdrawal   Anxiety and depression   Alcoholic pancreatitis   Sepsis   Community acquired pneumonia    Discharge instructions    Discharge Instructions     Diet - low sodium heart healthy   Complete by: As directed    Discharge instructions   Complete by: As directed    Follow with Primary MD [name], MD in 7 days   Get CBC, CMP, Magnesium, 2 view Chest X ray -  checked next visit with your primary MD   Activity: As tolerated with Full fall precautions use walker/cane & assistance as needed  Disposition Home    Diet: Heart Healthy    Special Instructions: If you have smoked or chewed Tobacco  in the last 2 yrs please stop smoking, stop any regular Alcohol  and or any Recreational drug use.  On your next visit with your primary care physician please Get Medicines reviewed and adjusted.  Please request your Prim.MD to go over all Hospital Tests and Procedure/Radiological results at the follow up, please get  all Hospital records sent to your Prim MD by signing hospital release before you go home.  If you experience worsening of your admission symptoms, develop shortness of breath, life threatening emergency, suicidal or homicidal thoughts you must seek medical attention immediately by calling 911 or calling your MD immediately  if symptoms less severe.  You Must read complete instructions/literature along with all the possible adverse reactions/side effects for all the Medicines you take and that have been prescribed to you. Take any new Medicines after you have completely understood and accpet all the possible adverse reactions/side effects.   Increase activity slowly   Complete by: As directed        Discharge Medications   Allergies as of 06/18/2022   No Known Allergies      Medication List     STOP taking these medications    chlordiazePOXIDE  25 MG capsule Commonly known as: LIBRIUM       TAKE these medications    acetaminophen 325 MG tablet Commonly known as: TYLENOL Take 2 tablets (650 mg total) by mouth every 6 (six) hours as needed for fever, moderate pain, mild pain or headache.   amLODipine 5 MG tablet Commonly known as: NORVASC Take 1 tablet (5 mg total) by mouth daily.   amoxicillin-clavulanate 875-125 MG tablet Commonly known as: AUGMENTIN Take 1 tablet by mouth 2 (two) times daily for 2 days.   carvedilol 3.125 MG tablet Commonly known as: COREG Take 1 tablet (3.125 mg total) by mouth 2 (two) times daily with a meal.   clobetasol cream 0.05 % Commonly known as: TEMOVATE Apply 1 Application topically 2 (two) times daily.   Creon 12000-38000 units Cpep capsule Generic drug: lipase/protease/amylase Take 1 capsule (12,000 Units total) by mouth 3 (three) times daily before meals.   folic acid 1 MG tablet Commonly known as: FOLVITE Take 1 tablet (1 mg total) by mouth daily.   Iron (Ferrous Sulfate) 325 (65 Fe) MG Tabs Take 1 tablet ( 325 mg) by mouth daily.   multivitamin with minerals Tabs tablet Take 1 tablet by mouth daily.   nicotine 14 mg/24hr patch Commonly known as: NICODERM CQ - dosed in mg/24 hours Place 1 patch (14 mg total) onto the skin daily.   ondansetron 8 MG tablet Commonly known as: ZOFRAN Take 1 tablet (8 mg total) by mouth every 6 (six) hours as needed for nausea.   pantoprazole 40 MG tablet Commonly known as: PROTONIX Take 1 tablet (40 mg total) by mouth 2 (two) times daily before a meal.   polyethylene glycol 17 g packet Commonly known as: MIRALAX / GLYCOLAX Take 17 g by mouth daily as needed for mild constipation or moderate constipation.   senna-docusate 8.6-50 MG tablet Commonly known as: Senokot-S Take 1 tablet by mouth at bedtime as needed for mild constipation.   sucralfate 1 g tablet Commonly known as: Carafate Take 1 tablet (1 g total) by mouth 4 (four) times  daily -  with meals and at bedtime.   thiamine 100 MG tablet Commonly known as: Vitamin B-1 Take 1 tablet (100 mg total) by mouth daily.         Follow-up Information     [name], MD. Schedule an appointment as soon as possible for a visit in 1 week(s).   Specialty: Pulmonary Disease Contact information: [address] [place] [address] [phone]                 Major  procedures and Radiology Reports - PLEASE review detailed and final reports thoroughly  -      DG Chest Port 1 View  Result Date: 06/16/2022 CLINICAL DATA:  57 year old male with history of shortness of breath. EXAM: PORTABLE CHEST 1 VIEW COMPARISON:  Chest x-ray 06/13/2022. FINDINGS: Opacity in the right base obscuring the right hemidiaphragm, concerning for right lower lobe pneumonia. Ill-defined opacity also noted in the medial aspect of the left lung base. Small right pleural effusion. No left pleural effusion. No pneumothorax. No evidence of pulmonary edema. Heart size appears borderline enlarged. The patient is rotated to the right on today's exam, resulting in distortion of the mediastinal contours and reduced diagnostic sensitivity and specificity for mediastinal pathology. IMPRESSION: 1. Right lower lobe pneumonia. 2. Additional area of atelectasis and/or consolidation in the left lower lobe medially. 3. Small right pleural effusion. Electronically Signed   By: Trudie Reed M.D.   On: 06/16/2022 09:15   DG Abd Portable 1V  Result Date: 06/15/2022 CLINICAL DATA:  Abdominal pain EXAM: PORTABLE ABDOMEN - 1 VIEW COMPARISON:  06/14/2022 CT FINDINGS: Scattered large and small bowel gas is noted. No obstructive changes are seen. No free air is noted. No acute bony abnormality is seen. IMPRESSION: No acute abnormality noted. Should be noted the initial film is mismarked and corrected on the second film. Electronically Signed   By: Alcide Clever M.D.   On: 06/15/2022 09:46   CT  ABDOMEN PELVIS W CONTRAST  Result Date: 06/14/2022 CLINICAL DATA:  Discharged yesterday for pneumonia. Very weak. Abdominal pain with some diffuse tenderness to palpation and right upper quadrant pain EXAM: CT ABDOMEN AND PELVIS WITH CONTRAST TECHNIQUE: Multidetector CT imaging of the abdomen and pelvis was performed using the standard protocol following bolus administration of intravenous contrast. RADIATION DOSE REDUCTION: This exam was performed according to the departmental dose-optimization program which includes automated exposure control, adjustment of the mA and/or kV according to patient size and/or use of iterative reconstruction technique. CONTRAST:  75mL OMNIPAQUE IOHEXOL 350 MG/ML SOLN COMPARISON:  CT chest abdomen and pelvis 06/10/2022 FINDINGS: Lower chest: Bilateral airspace opacities in the lower lungs compatible with multifocal pneumonia. Hepatobiliary: Hepatic steatosis. The gallbladder is decompressed. No biliary dilation. Pancreas: Similar to slightly decreased fluid and inflammatory changes along the pancreatico duodenal groove (series 3/image [id number]). No organized fluid collection. No pancreatic necrosis the portal vein is patent. Spleen: Unremarkable. Adrenals/Urinary Tract: Normal adrenal glands. No urinary calculi or hydronephrosis. Nondistended bladder. Stomach/Bowel: Normal caliber large and small bowel. Normal appendix. Mild wall thickening of the duodenum in the region of the pancreatic head. Stomach is within normal limits. Vascular/Lymphatic: Aortic atherosclerotic calcification. No lymphadenopathy. Reproductive: Unremarkable. Other: No free intraperitoneal air. Musculoskeletal: No acute osseous abnormality. IMPRESSION: 1. Improving groove pancreatitis. 2. Multifocal pneumonia. 3. Hepatic steatosis. Aortic Atherosclerosis (ICD10-I70.0). Electronically Signed   By: Minerva Fester M.D.   On: 06/14/2022 01:27   DG Chest Port 1 View  Result Date: 06/13/2022 CLINICAL DATA:   Questionable sepsis - evaluate for abnormality EXAM: PORTABLE CHEST 1 VIEW COMPARISON:  CT angio chest 06/10/2022, chest x-ray 06/09/2022 FINDINGS: The heart and mediastinal contours are unchanged. Aortic calcification. Persistent right base and right middle lobe consolidation. No pulmonary edema. No pleural effusion. No pneumothorax. No acute osseous abnormality. IMPRESSION: 1. Persistent right base and right middle lobe infection. Followup PA and lateral chest X-ray is recommended in 3-4 weeks following therapy to ensure resolution. 2.  Aortic Atherosclerosis (ICD10-I70.0). Electronically Signed   By: Blanchie Serve  Tessie Fass M.D.   On: 06/13/2022 22:51   CT Angio Chest/Abd/Pel for Dissection W and/or W/WO  Result Date: 06/10/2022 CLINICAL DATA:  Chest pain, shortness of breath EXAM: CT ANGIOGRAPHY CHEST, ABDOMEN AND PELVIS TECHNIQUE: Non-contrast CT of the chest was initially obtained. Multidetector CT imaging through the chest, abdomen and pelvis was performed using the standard protocol during bolus administration of intravenous contrast. Multiplanar reconstructed images and MIPs were obtained and reviewed to evaluate the vascular anatomy. RADIATION DOSE REDUCTION: This exam was performed according to the departmental dose-optimization program which includes automated exposure control, adjustment of the mA and/or kV according to patient size and/or use of iterative reconstruction technique. CONTRAST:  OMNIPAQUE IOHEXOL 350 MG/ML SOLN COMPARISON:  CT abdomen/pelvis dated 06/07/2022 FINDINGS: CTA CHEST FINDINGS Cardiovascular: On unenhanced CT, there is no evidence of intramural hematoma. Following contrast administration, there is no evidence of thoracic aortic aneurysm or dissection. Mild atherosclerotic calcifications of the aortic arch. Although not tailored for evaluation of the pulmonary arteries, there is no evidence of central pulmonary embolism. The heart is normal in size.  No pericardial effusion.  Mild coronary atherosclerosis of the LAD. Mediastinum/Nodes: No suspicious mediastinal lymphadenopathy. Visualized thyroid is unremarkable. Lungs/Pleura: Multifocal patchy opacities with right middle and lower lobe consolidation. Additional mild left lower lobe patchy opacity. This appearance is compatible with multifocal pneumonia and is new from the recent prior CT abdomen/pelvis. No suspicious pulmonary nodules. No pleural effusion or pneumothorax. Musculoskeletal: Visualized osseous structures are within normal limits. Review of the MIP images confirms the above findings. CTA ABDOMEN AND PELVIS FINDINGS VASCULAR Aorta: No evidence of abdominal aortic aneurysm or dissection. Patent. Atherosclerotic calcifications. Celiac: Patent. SMA: Patent.  Atherosclerotic calcifications at the origin. Renals: Patent bilaterally. Atherosclerotic calcifications of the origin on the left. IMA: Patent. Inflow: Patent bilaterally.  Atherosclerotic calcifications. Veins: Grossly unremarkable. Review of the MIP images confirms the above findings. NON-VASCULAR Hepatobiliary: Moderate hepatic steatosis with focal fatty sparing along the gallbladder fossa. Gallbladder is unremarkable. No intrahepatic or extrahepatic ductal dilatation. Pancreas: Mild fluid/inflammatory changes along the pancreaticoduodenal groove (series 7/image 175), less conspicuous than on the recent prior, favored to be improved. Spleen: Within normal limits. Adrenals/Urinary Tract: Adrenal glands are within normal limits. Kidneys are within normal limits.  No hydronephrosis. Bladder is within normal limits. Stomach/Bowel: Stomach is within normal limits. No evidence of bowel obstruction. Normal appendix (series 7/image 233). No colonic wall thickening or inflammatory changes. Lymphatic: No suspicious abdominopelvic lymphadenopathy. Reproductive: Mild enlargement of the central gland of the prostate, suggesting BPH. Other: No abdominopelvic ascites. Musculoskeletal:  Degenerative changes at L5-S1. Review of the MIP images confirms the above findings. IMPRESSION: No thoracoabdominal aortic aneurysm or dissection. Multifocal pneumonia, right middle and lower lobe predominant, new from the recent prior CT abdomen/pelvis. Improving groove pancreatitis. Moderate hepatic steatosis. Electronically Signed   By: Charline Bills M.D.   On: 06/10/2022 02:33   DG Chest 2 View  Result Date: 06/09/2022 CLINICAL DATA:  Chest pain, shortness of breath. EXAM: CHEST - 2 VIEW COMPARISON:  06/06/2022. FINDINGS: The heart is enlarged and mediastinal contours are stable. There is atherosclerotic calcification of the aorta. Pulmonary vasculature is mildly distended. Mild airspace disease is present at the lung bases bilaterally. No effusion or pneumothorax. No acute osseous abnormality. IMPRESSION: 1. Distended pulmonary vasculature. 2. Airspace disease at the lung bases, possible edema or infiltrate. Electronically Signed   By: Thornell Sartorius M.D.   On: 06/09/2022 20:49   CT ABDOMEN PELVIS W CONTRAST  Result  Date: 06/07/2022 CLINICAL DATA:  Abdominal pain, acute, nonlocalized.  Fall. EXAM: CT ABDOMEN AND PELVIS WITH CONTRAST TECHNIQUE: Multidetector CT imaging of the abdomen and pelvis was performed using the standard protocol following bolus administration of intravenous contrast. RADIATION DOSE REDUCTION: This exam was performed according to the departmental dose-optimization program which includes automated exposure control, adjustment of the mA and/or kV according to patient size and/or use of iterative reconstruction technique. CONTRAST:  75mL OMNIPAQUE IOHEXOL 350 MG/ML SOLN COMPARISON:  04/11/2022 FINDINGS: Lower chest: No acute abnormality Hepatobiliary: Diffuse low-density throughout the liver compatible with fatty infiltration. No focal abnormality. Gallbladder unremarkable. Pancreas: Stranding in the fat between the pancreatic head and the 2nd portion of the duodenum, similar  prior study. Few punctate calcifications within the pancreatic head. No ductal dilatation. Spleen: No focal abnormality.  Normal size. Adrenals/Urinary Tract: Mild bladder wall thickening. No renal or adrenal mass. No hydronephrosis. Stomach/Bowel: Inflammation adjacent to the 2nd portion of the duodenum. No evidence of bowel obstruction. Remainder of bowel unremarkable. Vascular/Lymphatic: Aortic atherosclerosis. No evidence of aneurysm or adenopathy. Reproductive: No visible focal abnormality. Other: No free fluid or free air. Musculoskeletal: No acute bony abnormality. IMPRESSION: Continued peripancreatic inflammation adjacent to the pancreatic head between the pancreatic head and duodenal C loop. Findings could reflect acute focal pancreatitis or duodenitis. Fatty liver. Aortic atherosclerosis. Mild bladder wall thickening which may be related to decompression, but recommend clinical correlation to exclude cystitis. Electronically Signed   By: Charlett Nose M.D.   On: 06/07/2022 03:37   CT Head Wo Contrast  Result Date: 06/06/2022 CLINICAL DATA:  Head trauma, moderate-severe; Neck trauma, midline tenderness (Age 79-64y) EXAM: CT HEAD WITHOUT CONTRAST CT CERVICAL SPINE WITHOUT CONTRAST TECHNIQUE: Multidetector CT imaging of the head and cervical spine was performed following the standard protocol without intravenous contrast. Multiplanar CT image reconstructions of the cervical spine were also generated. RADIATION DOSE REDUCTION: This exam was performed according to the departmental dose-optimization program which includes automated exposure control, adjustment of the mA and/or kV according to patient size and/or use of iterative reconstruction technique. COMPARISON:  None Available. FINDINGS: CT HEAD FINDINGS Brain: No evidence of acute infarction, hemorrhage, hydrocephalus, extra-axial collection or mass lesion/mass effect. Vascular: No hyperdense vessel. Skull: No acute fracture. Sinuses/Orbits: Remote  appearing left medial orbital wall fracture. No acute orbital finding. Other: No mastoid effusions. CT CERVICAL SPINE FINDINGS Alignment: Mild reversal of the normal cervical lordosis. Mild likely degenerative retrolisthesis of C3 on C4 and C4 on C5. Skull base and vertebrae: No evidence of acute fracture. Vertebral body heights are maintained. Soft tissues and spinal canal: No prevertebral fluid or swelling. No visible canal hematoma. Disc levels: Multilevel facet and uncovertebral hypertrophy with varying degrees of neural foraminal stenosis. Upper chest: Visualized lung apices are clear. IMPRESSION: No evidence of acute abnormality intracranially or in the cervical spine. Electronically Signed   By: Feliberto Harts M.D.   On: 06/06/2022 14:47   CT Cervical Spine Wo Contrast  Result Date: 06/06/2022 CLINICAL DATA:  Head trauma, moderate-severe; Neck trauma, midline tenderness (Age 79-64y) EXAM: CT HEAD WITHOUT CONTRAST CT CERVICAL SPINE WITHOUT CONTRAST TECHNIQUE: Multidetector CT imaging of the head and cervical spine was performed following the standard protocol without intravenous contrast. Multiplanar CT image reconstructions of the cervical spine were also generated. RADIATION DOSE REDUCTION: This exam was performed according to the departmental dose-optimization program which includes automated exposure control, adjustment of the mA and/or kV according to patient size and/or use of iterative reconstruction technique. COMPARISON:  None Available. FINDINGS: CT HEAD FINDINGS Brain: No evidence of acute infarction, hemorrhage, hydrocephalus, extra-axial collection or mass lesion/mass effect. Vascular: No hyperdense vessel. Skull: No acute fracture. Sinuses/Orbits: Remote appearing left medial orbital wall fracture. No acute orbital finding. Other: No mastoid effusions. CT CERVICAL SPINE FINDINGS Alignment: Mild reversal of the normal cervical lordosis. Mild likely degenerative retrolisthesis of C3 on C4  and C4 on C5. Skull base and vertebrae: No evidence of acute fracture. Vertebral body heights are maintained. Soft tissues and spinal canal: No prevertebral fluid or swelling. No visible canal hematoma. Disc levels: Multilevel facet and uncovertebral hypertrophy with varying degrees of neural foraminal stenosis. Upper chest: Visualized lung apices are clear. IMPRESSION: No evidence of acute abnormality intracranially or in the cervical spine. Electronically Signed   By: Feliberto Harts M.D.   On: 06/06/2022 14:47   DG Chest 2 View  Result Date: 06/06/2022 CLINICAL DATA:  pain after fall EXAM: CHEST - 2 VIEW COMPARISON:  Radiograph 04/16/2022 FINDINGS: Unchanged cardiomediastinal silhouette. There is no focal airspace consolidation. There is no pleural effusion or evidence of pneumothorax. No acute osseous abnormality. IMPRESSION: No evidence of acute cardiopulmonary disease. Electronically Signed   By: Caprice Renshaw M.D.   On: 06/06/2022 14:39   DG Shoulder Left  Result Date: 06/06/2022 CLINICAL DATA:  pain after fall EXAM: LEFT SHOULDER - 2+ VIEW COMPARISON:  None Available. FINDINGS: There is no evidence of acute fracture. Alignment is normal. 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 IMPRESSION: No acute fracture or dislocation. 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 Electronically Signed   By: Caprice Renshaw M.D.   On: 06/06/2022 14:37   DG Knee Complete 4 Views Left  Result Date: 05/22/2022 CLINICAL DATA:  Larey Seat.  Left knee pain. EXAM: LEFT KNEE - COMPLETE 4+ VIEW COMPARISON:  12/25/2021 FINDINGS: The joint spaces are maintained. No acute fracture is identified. No joint effusion. Stable vascular calcifications. IMPRESSION: No acute bony findings or joint effusion. Electronically Signed   By: Rudie Meyer M.D.   On: 05/22/2022 14:08   DG Knee Complete 4 Views Right  Result Date: 05/22/2022 CLINICAL DATA:  Pain after fall EXAM: RIGHT KNEE - COMPLETE 4  VIEW COMPARISON:  X-ray 12/25/2021 FINDINGS: No evidence of fracture, dislocation, or joint effusion. No evidence of arthropathy or other focal bone abnormality. Soft tissues are unremarkable. Few nonspecific soft tissue calcifications are stable. Possibly vascular. IMPRESSION: No acute osseous abnormality Electronically Signed   By: Karen Kays M.D.   On: 05/22/2022 13:55    Micro Results    Recent Results (from the past 240 hour(s))  Culture, blood (Routine X 2) w Reflex to ID Panel     Status: None   Collection Time: 06/10/22  9:30 AM   Specimen: BLOOD  Result Value Ref Range Status   Specimen Description BLOOD RIGHT ANTECUBITAL  Final   Special Requests   Final    BOTTLES DRAWN AEROBIC AND ANAEROBIC Blood Culture adequate volume   Culture   Final    NO GROWTH 5 DAYS Performed at [facility], [address] [place], [place] [address]    Report Status 06/15/2022 FINAL  Final  SARS Coronavirus 2 by RT PCR (hospital order, performed in [facility] hospital lab) *cepheid single result test* Peripheral     Status: None   Collection Time: 06/10/22  3:27 PM   Specimen: Peripheral; Nasal Swab  Result Value Ref Range Status   SARS Coronavirus 2 by RT PCR NEGATIVE  NEGATIVE Final    Comment: Performed at [facility], [address], [place], [place] [address]  Respiratory (~20 pathogens) panel by PCR     Status: None   Collection Time: 06/10/22  3:27 PM   Specimen: Nasopharyngeal Swab; Respiratory  Result Value Ref Range Status   Adenovirus NOT DETECTED NOT DETECTED Final   Coronavirus 229E NOT DETECTED NOT DETECTED Final    Comment: (NOTE) The Coronavirus on the Respiratory Panel, DOES NOT test for the novel  Coronavirus (2019 nCoV)    Coronavirus HKU1 NOT DETECTED NOT DETECTED Final   Coronavirus NL63 NOT DETECTED NOT DETECTED Final   Coronavirus OC43 NOT DETECTED NOT DETECTED Final   Metapneumovirus NOT DETECTED NOT DETECTED Final   Rhinovirus / Enterovirus NOT  DETECTED NOT DETECTED Final   Influenza A NOT DETECTED NOT DETECTED Final   Influenza B NOT DETECTED NOT DETECTED Final   Parainfluenza Virus 1 NOT DETECTED NOT DETECTED Final   Parainfluenza Virus 2 NOT DETECTED NOT DETECTED Final   Parainfluenza Virus 3 NOT DETECTED NOT DETECTED Final   Parainfluenza Virus 4 NOT DETECTED NOT DETECTED Final   Respiratory Syncytial Virus NOT DETECTED NOT DETECTED Final   Bordetella pertussis NOT DETECTED NOT DETECTED Final   Bordetella Parapertussis NOT DETECTED NOT DETECTED Final   Chlamydophila pneumoniae NOT DETECTED NOT DETECTED Final   Mycoplasma pneumoniae NOT DETECTED NOT DETECTED Final    Comment: Performed at [facility], [address], [place], [place] [address]  Culture, blood (Routine X 2) w Reflex to ID Panel     Status: None   Collection Time: 06/10/22  4:32 PM   Specimen: BLOOD LEFT ARM  Result Value Ref Range Status   Specimen Description BLOOD LEFT ARM  Final   Special Requests   Final    BOTTLES DRAWN AEROBIC AND ANAEROBIC Blood Culture adequate volume   Culture   Final    NO GROWTH 5 DAYS Performed at [facility], [address], [place], [place] [address]    Report Status 06/15/2022 FINAL  Final  MRSA Next Gen by PCR, Nasal     Status: None   Collection Time: 06/10/22  5:30 PM   Specimen: Nasal Mucosa; Nasal Swab  Result Value Ref Range Status   MRSA by PCR Next Gen NOT DETECTED NOT DETECTED Final    Comment: (NOTE) The GeneXpert MRSA Assay (FDA approved for NASAL specimens only), is one component of a comprehensive MRSA colonization surveillance program. It is not intended to diagnose MRSA infection nor to guide or monitor treatment for MRSA infections. Test performance is not FDA approved in patients less than 47 years old. Performed at [facility], [address] [place], [place] [address]   Urine Culture (for pregnant, neutropenic or urologic patients or patients with an indwelling urinary  catheter)     Status: None   Collection Time: 06/10/22  6:14 PM   Specimen: Urine, Clean Catch  Result Value Ref Range Status   Specimen Description URINE, CLEAN CATCH  Final   Special Requests NONE  Final   Culture   Final    NO GROWTH Performed at [facility], [address] [place], [place] [address]    Report Status 06/11/2022 FINAL  Final  Blood Culture (routine x 2)     Status: None   Collection Time: 06/13/22 11:10 PM   Specimen: BLOOD  Result Value Ref Range Status   Specimen Description BLOOD SITE  NOT SPECIFIED  Final   Special Requests   Final    BOTTLES DRAWN AEROBIC AND ANAEROBIC Blood Culture adequate volume   Culture   Final    NO GROWTH 5 DAYS Performed at [facility], [address], [place], [place] [address]    Report Status 06/18/2022 FINAL  Final  Blood Culture (routine x 2)     Status: None   Collection Time: 06/13/22 11:10 PM   Specimen: BLOOD LEFT FOREARM  Result Value Ref Range Status   Specimen Description BLOOD LEFT FOREARM  Final   Special Requests   Final    BOTTLES DRAWN AEROBIC AND ANAEROBIC Blood Culture adequate volume   Culture   Final    NO GROWTH 5 DAYS Performed at [facility], [address], [place], [place] [address]    Report Status 06/18/2022 FINAL  Final  Resp panel by RT-PCR (RSV, Flu A&B, Covid) Anterior Nasal Swab     Status: None   Collection Time: 06/14/22 12:54 AM   Specimen: Anterior Nasal Swab  Result Value Ref Range Status   SARS Coronavirus 2 by RT PCR NEGATIVE NEGATIVE Final   Influenza A by PCR NEGATIVE NEGATIVE Final   Influenza B by PCR NEGATIVE NEGATIVE Final    Comment: (NOTE) The Xpert Xpress SARS-CoV-2/FLU/RSV plus assay is intended as an aid in the diagnosis of influenza from Nasopharyngeal swab specimens and should not be used as a sole basis for treatment. Nasal washings and aspirates are unacceptable for Xpert Xpress SARS-CoV-2/FLU/RSV testing.  Fact Sheet for  Patients: BloggerCourse.com  Fact Sheet for Healthcare Providers: SeriousBroker.it  This test is not yet approved or cleared by the [place] FDA and has been authorized for detection and/or diagnosis of SARS-CoV-2 by FDA under an Emergency Use Authorization (EUA). This EUA will remain in effect (meaning this test can be used) for the duration of the COVID-19 declaration under Section 564(b)(1) of the Act, 21 U.S.C. section 360bbb-3(b)(1), unless the authorization is terminated or revoked.     Resp Syncytial Virus by PCR NEGATIVE NEGATIVE Final    Comment: (NOTE) Fact Sheet for Patients: BloggerCourse.com  Fact Sheet for Healthcare Providers: SeriousBroker.it  This test is not yet approved or cleared by the [place] FDA and has been authorized for detection and/or diagnosis of SARS-CoV-2 by FDA under an Emergency Use Authorization (EUA). This EUA will remain in effect (meaning this test can be used) for the duration of the COVID-19 declaration under Section 564(b)(1) of the Act, 21 U.S.C. section 360bbb-3(b)(1), unless the authorization is terminated or revoked.  Performed at [facility], [address], [place], [place] [address]     Today   Subjective    Samuel Blevins today has no headache,no chest abdominal pain,no new weakness tingling or numbness, feels much better wants to go home today.    Objective   Blood pressure (!) 142/78, pulse 88, temperature 98.8 F (37.1 C), temperature source Oral, resp. rate 18, SpO2 94 %.   Intake/Output Summary (Last 24 hours) at 06/18/2022 0745 Last data filed at 06/17/2022 2100 Gross per 24 hour  Intake --  Output 950 ml  Net -950 ml    Exam  Awake Alert, No new F.N deficits, anxious to leave ASAP [place].AT,PERRAL Supple Neck,   Symmetrical Chest wall movement, Good air movement bilaterally, CTAB RRR,No  Gallops,   +ve B.Sounds, Abd Soft, Non tender,  No Cyanosis, Clubbing or edema    Data Review  Recent Labs  Lab 06/13/22 2310 06/14/22 0406 06/15/22 0357 06/16/22 0340 06/17/22 0431  WBC 10.4 7.4 5.4 5.3 5.2  HGB 10.2* 10.1* 10.6* 10.4* 10.0*  HCT 30.3* 30.9* 31.8* 31.1* 29.3*  PLT 272 267 333 402* 479*  MCV 92.9 93.9 92.2 92.6 91.8  MCH 31.3 30.7 30.7 31.0 31.3  MCHC 33.7 32.7 33.3 33.4 34.1  RDW 15.7* 15.9* 15.6* 15.9* 16.2*  LYMPHSABS 1.2 1.3 1.0 0.6* 0.7  MONOABS 2.0* 1.5* 0.5 0.4 0.4  EOSABS 0.0 0.0 0.3 0.0 0.2  BASOSABS 0.1 0.1 0.1 0.1 0.0    Recent Labs  Lab 06/13/22 2310 06/14/22 0406 06/14/22 1801 06/15/22 0357 06/15/22 1507 06/16/22 0340 06/17/22 0431 06/18/22 0436  NA 132* 133* 133* 130*  --  134* 135 133*  K 3.4* 3.0* 3.1* 2.9*  --  3.4* 3.2* 3.8  CL 95* 98 99 99  --  100 102 100  CO2 17* 18* 20* 22  --  25 25 24   ANIONGAP 20* 17* 14 9  --  9 8 9   GLUCOSE 73 73 107* 109*  --  122* 126* 104*  BUN 14 9 5* 5*  --  <5* <5* 5*  CREATININE 1.01 0.90 0.70 0.70  --  0.65 0.62 0.65  AST 51* 43*  --   --   --  68* 78* 134*  ALT 28 24  --   --   --  39 43 70*  ALKPHOS 84 77  --   --   --  76 82 101  BILITOT 2.1* 1.7*  --   --   --  0.6 0.5 0.7  ALBUMIN 3.0* 2.5*  --  2.4*  --  2.3* 2.3* 2.9*  CRP  --   --   --   --  10.1* 7.3* 5.3*  --   PROCALCITON  --  1.02  --   --   --  0.56 0.37  --   LATICACIDVEN 0.8  --   --   --   --   --   --   --   INR 1.1  --   --   --   --   --   --   --   BNP  --   --   --   --   --  130.1* 115.9*  --   MG  --  1.4*  --  1.3*  --  1.4* 1.7 1.7  CALCIUM 9.5 8.9 8.4* 8.2*  --  8.8* 8.6* 9.2    Total Time in preparing paper work, data evaluation and todays exam - 35 minutes  Signature  -    Susa Raring M.D on 06/18/2022 at 7:45 AM   -  To page go to [email or web address]

## 2022-06-18 NOTE — Progress Notes (Signed)
Pt has been discharged. Pt stated he will go back to [employer]. Pt given TOC meds and a bus pass. Pt has all the rest of his belongings. Pt alert and oriented x4 in no acute distress upon discharge.

## 2022-06-19 ENCOUNTER — Telehealth: Payer: Self-pay

## 2022-06-19 NOTE — Transitions of Care (Post Inpatient/ED Visit) (Signed)
   06/19/2022  Name: Samuel Blevins MRN: [id number] DOB: Feb 11, 1966  Today's TOC FU Call Status: Today's TOC FU Call Status:: Unsuccessul Call (1st Attempt) Unsuccessful Call (1st Attempt) Date: 06/19/22  Attempted to reach the patient regarding the most recent Inpatient/ED visit.  Follow Up Plan: Additional outreach attempts will be made to reach the patient to complete the Transitions of Care (Post Inpatient/ED visit) call.   Signature  Audie Box

## 2022-06-20 ENCOUNTER — Telehealth: Payer: Self-pay

## 2022-06-20 ENCOUNTER — Telehealth: Payer: Self-pay | Admitting: Critical Care Medicine

## 2022-06-20 ENCOUNTER — Other Ambulatory Visit: Payer: Self-pay | Admitting: Physician Assistant

## 2022-06-20 ENCOUNTER — Other Ambulatory Visit: Payer: Self-pay

## 2022-06-20 ENCOUNTER — Encounter: Payer: Self-pay | Admitting: *Deleted

## 2022-06-20 DIAGNOSIS — G4709 Other insomnia: Secondary | ICD-10-CM

## 2022-06-20 MED ORDER — HYDROXYZINE HCL 25 MG PO TABS
25.0000 mg | ORAL_TABLET | Freq: Every evening | ORAL | 0 refills | Status: DC | PRN
Start: 2022-06-20 — End: 2022-07-10
  Filled 2022-06-20 – 2022-07-01 (×2): qty 30, 30d supply, fill #0

## 2022-06-20 NOTE — Telephone Encounter (Signed)
Duplicate message. 

## 2022-06-20 NOTE — Telephone Encounter (Signed)
duplicate

## 2022-06-20 NOTE — Congregational Nurse Program (Signed)
  Dept: [phone]   Congregational Nurse Program Note  Date of Encounter: 06/20/2022  Past Medical History: Past Medical History:  Diagnosis Date   Alcohol withdrawal syndrome with complication    Alcoholism    Elevated AST (SGOT)    Gastrointestinal hemorrhage    Homeless    Hypertension    Pancreatic insufficiency    takes Creon   Symptomatic anemia 11/23/2015   Thrombocytopenia 05/09/2021    Encounter Details:  CNP Questionnaire - 06/20/22 1618       Questionnaire   Ask client: Do you give verbal consent for me to treat you today? Yes    Student Assistance N/A    Location Patient Served  [facility]    Visit Setting with Client Organization;Phone/Text/Email    Patient Status Clinical biochemist or VA Insurance;Uninsured ([employer]/Care Connects/Self-Pay/Medicaid Family Planning)    Insurance/Financial Assistance Referral Medicaid    Medication Provided Medication Assistance;Have Medication Insecurities    Medical Provider Yes    Screening Referrals Made N/A    Medical Referrals Made N/A    Medical Appointment Made [facility]    Recently w/o PCP, now 1st time PCP visit completed due to CNs referral or appointment made N/A    Food N/A    Transportation Need transportation assistance;Provided transportation assistance    Housing/Utilities No permanent housing    Interpersonal Safety N/A    Interventions Advocate/Support;Navigate Healthcare System;Educate;Reviewed Medications    Abnormal to Normal Screening Since Last CN Visit N/A    Screenings CN Performed Blood Pressure;Pulse Ox    Sent Client to Lab for: N/A    Did client attend any of the following based off CNs referral or appointments made? N/A    ED Visit Averted N/A    Life-Saving Intervention Made N/A            Client came to nurse's office with bag of medications saying his medication had been changed since discharging from the hospital and he needed help with them. Went over each  medication from hospital discharge summary. Contacted [facility] and set up follow up appointment for Tuesday April 30th 9:50 with Dr Alvis Lemmings. Client is to complete medicaid application while at appointment. Transportation is being provided by CM at [facility]. Murtaza Shell W RN CN

## 2022-06-20 NOTE — Telephone Encounter (Signed)
Copied from CRM [id number]. Topic: General - Other >> Jun 20, 2022  3:16 PM Carrielelia G wrote: Reason for CRM: Jane Wright would like to speak with Jane Brazeau. She has the client in front of her right now 

## 2022-06-20 NOTE — Telephone Encounter (Signed)
Copied from CRM [id number]. Topic: General - Other >> Jun 20, 2022  3:16 PM Carrielelia G wrote: Reason for CRM: Daisy Lazar would like to speak with Robyne Peers. She has the client in front of her right now

## 2022-06-20 NOTE — Telephone Encounter (Signed)
Duplicate.  Already addressed

## 2022-06-20 NOTE — Telephone Encounter (Signed)
Pharmacy noted in another telephone encounter

## 2022-06-20 NOTE — Telephone Encounter (Signed)
Cari - he would like the sleep med sent to [facility] at [facility]

## 2022-06-20 NOTE — Telephone Encounter (Signed)
Routing to [place]  Copied from [employer] [id number]. Topic: General - Other >> Jun 20, 2022  3:16 PM Carrielelia G wrote: Reason for CRM: Daisy Lazar would like to speak with Robyne Peers. She has the client in front of her right now

## 2022-06-20 NOTE — Transitions of Care (Post Inpatient/ED Visit) (Signed)
   06/20/2022  Name: Samuel Blevins MRN: [id number] DOB: Jan 29, 1966  Today's TOC FU Call Status: Today's TOC FU Call Status:: Successful TOC FU Call Competed Unsuccessful Call (1st Attempt) Date: 06/19/22 Unsuccessful Call (2nd Attempt) Date: 06/20/22 [facility] FU Call Complete Date: 06/20/22  Transition Care Management Follow-up Telephone Call Date of Discharge: 06/18/22 Discharge Facility: [facility]) Type of Discharge: Inpatient Admission Primary Inpatient Discharge Diagnosis:: pneumonia How have you been since you were released from the hospital?: Better Any questions or concerns?: Yes (he would like medication for sleep) Patient Questions/Concerns Addressed: Notified Provider of Patient Questions/Concerns  Items Reviewed: Did you receive and understand the discharge instructions provided?: Yes Waynetta Sandy, RN was reviewing the AVS when she was with the patient.) Medications obtained and verified?: Yes (Medications Reviewed) (Jan Margit Banda and Cheree Ditto, RN were reconciled patient's medications.  He has all medications exept the iron and they sent a message to Theodore Demark, PA informing her of his need for iron.) Any new allergies since your discharge?: No Dietary orders reviewed?: No Do you have support at home?: Yes Name of Support/Comfort Primary Source: Currently staying at [facility] and Equipment/Supplies: Were Home Health Services Ordered?: No Any new equipment or medical supplies ordered?: No  Functional Questionnaire: Do you need assistance with bathing/showering or dressing?: No Do you need assistance with meal preparation?: No Do you need assistance with eating?: No Do you have difficulty maintaining continence: No Do you need assistance with getting out of bed/getting out of a chair/moving?: Yes (amublates with RW) Do you have difficulty managing or taking your medications?: Yes (Jan [name], RN has assisted with Medication management)  Follow up  appointments reviewed: PCP Follow-up appointment confirmed?: Yes Date of PCP follow-up appointment?: 06/25/22 Follow-up Provider: [facility] Follow-up appointment confirmed?: NA Do you need transportation to your follow-up appointment?: Yes Transportation Need Intervention Addressed By:: Other: (transportation to be arranged by Vinie Sill, caseworker at [facility]) Do you understand care options if your condition(s) worsen?: Yes-patient verbalized understanding    SIGNATURE  Robyne Peers, RN

## 2022-06-20 NOTE — Transitions of Care (Post Inpatient/ED Visit) (Signed)
   06/20/2022  Name: Samuel Blevins MRN: [id number] DOB: October 22, 1965  Today's TOC FU Call Status: Today's TOC FU Call Status:: Unsuccessful Call (2nd Attempt) Unsuccessful Call (1st Attempt) Date: 06/19/22 Unsuccessful Call (2nd Attempt) Date: 06/20/22  Attempted to reach the patient regarding the most recent Inpatient/ED visit.  Follow Up Plan: Additional outreach attempts will be made to reach the patient to complete the Transitions of Care (Post Inpatient/ED visit) call.   Signature   Robyne Peers, RN

## 2022-06-21 ENCOUNTER — Other Ambulatory Visit: Payer: Self-pay

## 2022-06-24 NOTE — Progress Notes (Signed)
  Progress Note   Date: 06/24/2022  Patient Name: Samuel Blevins        MRN#: [id number]   The diagnosis of sepsis    was ruled out

## 2022-06-25 ENCOUNTER — Telehealth: Payer: Self-pay | Admitting: Critical Care Medicine

## 2022-06-25 ENCOUNTER — Encounter: Payer: Self-pay | Admitting: *Deleted

## 2022-06-25 ENCOUNTER — Ambulatory Visit: Payer: Self-pay | Admitting: Family Medicine

## 2022-06-25 ENCOUNTER — Telehealth: Payer: Self-pay

## 2022-06-25 NOTE — Congregational Nurse Program (Signed)
  Dept: [phone]   Congregational Nurse Program Note  Date of Encounter: 06/25/2022  Past Medical History: Past Medical History:  Diagnosis Date   Alcohol withdrawal syndrome with complication (HCC)    Alcoholism (HCC)    Elevated AST (SGOT)    Gastrointestinal hemorrhage    Homeless    Hypertension    Pancreatic insufficiency    takes Creon   Symptomatic anemia 11/23/2015   Thrombocytopenia (HCC) 05/09/2021    Encounter Details:  CNP Questionnaire - 06/25/22 0958       Questionnaire   Ask client: Do you give verbal consent for me to treat you today? Yes    Student Assistance N/A    Location Patient Served  [facility]    Visit Setting with Client Organization    Patient Status Unhoused    Insurance Uninsured ([employer] Card/Care Connects/Self-Pay/Medicaid Family Planning)    Insurance/Financial Assistance Referral N/A    Medication Have Medication Insecurities;Provided Medication Assistance    Medical Provider Yes    Screening Referrals Made N/A    Medical Referrals Made N/A    Medical Appointment Made N/A    Recently w/o PCP, now 1st time PCP visit completed due to CNs referral or appointment made N/A    Food N/A    Transportation N/A    Housing/Utilities No permanent housing    Interpersonal Safety N/A    Interventions Advocate/Support;Reviewed Medications    Abnormal to Normal Screening Since Last CN Visit N/A    Screenings CN Performed Blood Pressure;Pulse Ox    Sent Client to Lab for: N/A    Did client attend any of the following based off CNs referral or appointments made? N/A    ED Visit Averted N/A    Life-Saving Intervention Made N/A            Client came to nurse's office for assistance with medication. Explained each medication and assisted with pill container. Took vitals. Blood pressure 112/68, pulse 88, SpO2 94 %. Offered support and encouragement.  Eboney Claybrook W RN CN

## 2022-06-25 NOTE — Telephone Encounter (Signed)
Call returned to [facility] [phone], message left with call back requested.

## 2022-06-25 NOTE — Telephone Encounter (Signed)
I spoke to Waynetta Sandy, RN today as patient did not show up for his appointment at [facility] this morning. She said that Enid Skeens, [facility] caseworker, who was going to accompany patient to the clinic was not able to find him when she went to look for his this morning.    Jan will contact me after the patient sees Theodore Demark, PA at [facility] tomorrow and we can reschedule an appointment at [facility].  I also explained to Jan that we can arrange for a DSS Medicaid eligibility case worker to contact the patient and begin a Medicaid application if he does not already have a DSS caseworker.  Jan will try to contact his [facility] caseworker to inquire if she will be able to assist the patient with the phone call/ application process

## 2022-06-25 NOTE — Telephone Encounter (Signed)
Copied from CRM [id number]. Topic: Appointment Scheduling - Scheduling Inquiry for Clinic >> Jun 25, 2022 10:56 AM Everette C wrote: Reason for CRM: The patient would like for their casework to be contacted regarding rescheduling their appt   Please contact [phone] [facility] when possible

## 2022-06-26 ENCOUNTER — Telehealth: Payer: Self-pay | Admitting: Critical Care Medicine

## 2022-06-26 NOTE — Telephone Encounter (Signed)
Follow  up call documented in another telephone encounter today.

## 2022-06-26 NOTE — Telephone Encounter (Signed)
Call returned to [facility].  She explained that she has completed a lot of the Medicaid application with the patient but would like to speak with the Medicaid eligibility caseworkers at [facility]  I told her that I would contact the caseworkers with her contact information and ask them to call her    Email sent to [facility] caseworkers asking that they contact [facility]

## 2022-06-26 NOTE — Telephone Encounter (Signed)
Copied from CRM [id number]. Topic: General - Other >> Jun 26, 2022  9:44 AM Carrielelia G wrote: Attn: Robyne Peers.Marland KitchenMarland KitchenPlease call TaSheka at [phone] ext [phone] or [phone]    .Marland KitchenMarland KitchenMarland KitchenDiscussion: Applying for medicaid

## 2022-06-27 ENCOUNTER — Other Ambulatory Visit: Payer: Self-pay

## 2022-06-27 ENCOUNTER — Encounter: Payer: Self-pay | Admitting: *Deleted

## 2022-06-27 DIAGNOSIS — Z139 Encounter for screening, unspecified: Secondary | ICD-10-CM

## 2022-06-27 NOTE — Congregational Nurse Program (Signed)
  Dept: [phone]   Congregational Nurse Program Note  Date of Encounter: 06/27/2022  Past Medical History: Past Medical History:  Diagnosis Date   Alcohol withdrawal syndrome with complication (HCC)    Alcoholism (HCC)    Elevated AST (SGOT)    Gastrointestinal hemorrhage    Homeless    Hypertension    Pancreatic insufficiency    takes Creon   Symptomatic anemia 11/23/2015   Thrombocytopenia (HCC) 05/09/2021    Encounter Details:  CNP Questionnaire - 06/27/22 1258       Questionnaire   Ask client: Do you give verbal consent for me to treat you today? Yes    Student Assistance N/A    Location Patient Served  [facility]    Visit Setting with Client Phone/Text/Email;Organization    Patient Status Unhoused    Insurance Medicaid    Insurance/Financial Assistance Referral N/A    Medication N/A    Medical Provider Yes    Screening Referrals Made N/A    Medical Referrals Made N/A    Medical Appointment Made N/A    Recently w/o PCP, now 1st time PCP visit completed due to CNs referral or appointment made N/A    Food N/A    Transportation N/A    Housing/Utilities No permanent housing    Interpersonal Safety N/A    Interventions Advocate/Support    Abnormal to Normal Screening Since Last CN Visit N/A    Screenings CN Performed Blood Pressure;Blood Glucose;Temperature    Sent Client to Lab for: N/A    Did client attend any of the following based off CNs referral or appointments made? N/A    ED Visit Averted N/A    Life-Saving Intervention Made N/A           Client came in to see nurse in the afternoon. Checked vitals. Blood pressure 119/78, pulse (!) 105, temperature 98.6 F (37 C), resp. rate 20, SpO2 91 %. CBG 146. Client was sleepy while in office reporting he did not get to shelter until late and stayed outside. Reported to client that his medicaid had been approved. Tried to call [facility] and was out for lunch. Will try later to set up another appointment as client missed  his last appointment. Addison Freimuth W RN CN

## 2022-07-01 ENCOUNTER — Other Ambulatory Visit (HOSPITAL_COMMUNITY): Payer: Self-pay

## 2022-07-01 ENCOUNTER — Encounter: Payer: Self-pay | Admitting: *Deleted

## 2022-07-01 ENCOUNTER — Other Ambulatory Visit: Payer: Self-pay

## 2022-07-01 NOTE — Congregational Nurse Program (Signed)
  Dept: [phone]   Congregational Nurse Program Note  Date of Encounter: 07/01/2022  Past Medical History: Past Medical History:  Diagnosis Date   Alcohol withdrawal syndrome with complication (HCC)    Alcoholism (HCC)    Elevated AST (SGOT)    Gastrointestinal hemorrhage    Homeless    Hypertension    Pancreatic insufficiency    takes Creon   Symptomatic anemia 11/23/2015   Thrombocytopenia (HCC) 05/09/2021    Encounter Details:  CNP Questionnaire - 07/01/22 1340       Questionnaire   Ask client: Do you give verbal consent for me to treat you today? Yes    Student Assistance N/A    Location Patient Served  [facility]    Visit Setting with Client Organization    Patient Status Unhoused    Insurance Medicaid    Insurance/Financial Assistance Referral N/A    Medication Have Medication Insecurities;Provided Medication Assistance    Medical Provider Yes    Screening Referrals Made N/A    Medical Referrals Made N/A    Medical Appointment Made [facility]    Recently w/o PCP, now 1st time PCP visit completed due to CNs referral or appointment made N/A    Food N/A    Transportation N/A    Housing/Utilities No permanent housing    Interpersonal Safety N/A    Interventions Advocate/Support;Educate    Abnormal to Normal Screening Since Last CN Visit N/A    Screenings CN Performed Blood Pressure;Pulse Ox    Sent Client to Lab for: N/A    Did client attend any of the following based off CNs referral or appointments made? N/A    ED Visit Averted N/A    Life-Saving Intervention Made N/A            Client seen at [facility] by CN. Checked vitals. Blood pressure 120/72, pulse (!) 108, SpO2 93 %. Client went to the ED c/o not sleeping well. Picked up Vistaril for client prescribed in ED. Educated client about how to take medication and placed in his medication bag. Per pill box client has missed many doses of his medication. Provided education and support. Received new appt from [facility]  May 22 at 10:30. Shared information with client and his SW. Client plans to see Dr. [name] at [facility] on Wednesday.  Arlington Sigmund W RN CN

## 2022-07-10 ENCOUNTER — Other Ambulatory Visit: Payer: Self-pay

## 2022-07-10 ENCOUNTER — Emergency Department (HOSPITAL_COMMUNITY): Payer: Medicaid Other

## 2022-07-10 ENCOUNTER — Encounter: Payer: Self-pay | Admitting: *Deleted

## 2022-07-10 ENCOUNTER — Encounter: Payer: Self-pay | Admitting: Physician Assistant

## 2022-07-10 ENCOUNTER — Other Ambulatory Visit: Payer: Self-pay | Admitting: Critical Care Medicine

## 2022-07-10 ENCOUNTER — Inpatient Hospital Stay (HOSPITAL_COMMUNITY)
Admission: EM | Admit: 2022-07-10 | Discharge: 2022-07-14 | DRG: 439 | Disposition: A | Discharge status: Home / Self Care | Payer: Medicaid Other | Attending: Internal Medicine | Admitting: Internal Medicine

## 2022-07-10 ENCOUNTER — Encounter (HOSPITAL_COMMUNITY): Payer: Self-pay | Admitting: Emergency Medicine

## 2022-07-10 DIAGNOSIS — K860 Alcohol-induced chronic pancreatitis: Secondary | ICD-10-CM | POA: Diagnosis present

## 2022-07-10 DIAGNOSIS — Z5901 Sheltered homelessness: Secondary | ICD-10-CM

## 2022-07-10 DIAGNOSIS — R251 Tremor, unspecified: Secondary | ICD-10-CM | POA: Diagnosis present

## 2022-07-10 DIAGNOSIS — N390 Urinary tract infection, site not specified: Secondary | ICD-10-CM | POA: Diagnosis present

## 2022-07-10 DIAGNOSIS — Z79899 Other long term (current) drug therapy: Secondary | ICD-10-CM

## 2022-07-10 DIAGNOSIS — Y904 Blood alcohol level of 80-99 mg/100 ml: Secondary | ICD-10-CM | POA: Diagnosis present

## 2022-07-10 DIAGNOSIS — R112 Nausea with vomiting, unspecified: Secondary | ICD-10-CM | POA: Diagnosis present

## 2022-07-10 DIAGNOSIS — K920 Hematemesis: Secondary | ICD-10-CM | POA: Diagnosis present

## 2022-07-10 DIAGNOSIS — E876 Hypokalemia: Secondary | ICD-10-CM | POA: Diagnosis present

## 2022-07-10 DIAGNOSIS — F1010 Alcohol abuse, uncomplicated: Secondary | ICD-10-CM | POA: Diagnosis present

## 2022-07-10 DIAGNOSIS — K2980 Duodenitis without bleeding: Secondary | ICD-10-CM | POA: Diagnosis present

## 2022-07-10 DIAGNOSIS — F17210 Nicotine dependence, cigarettes, uncomplicated: Secondary | ICD-10-CM | POA: Diagnosis present

## 2022-07-10 DIAGNOSIS — K852 Alcohol induced acute pancreatitis without necrosis or infection: Principal | ICD-10-CM | POA: Diagnosis present

## 2022-07-10 DIAGNOSIS — K760 Fatty (change of) liver, not elsewhere classified: Secondary | ICD-10-CM | POA: Diagnosis present

## 2022-07-10 DIAGNOSIS — I10 Essential (primary) hypertension: Secondary | ICD-10-CM | POA: Diagnosis present

## 2022-07-10 DIAGNOSIS — R109 Unspecified abdominal pain: Secondary | ICD-10-CM | POA: Diagnosis present

## 2022-07-10 DIAGNOSIS — K219 Gastro-esophageal reflux disease without esophagitis: Secondary | ICD-10-CM | POA: Diagnosis present

## 2022-07-10 DIAGNOSIS — R1013 Epigastric pain: Secondary | ICD-10-CM

## 2022-07-10 DIAGNOSIS — D6959 Other secondary thrombocytopenia: Secondary | ICD-10-CM | POA: Diagnosis present

## 2022-07-10 DIAGNOSIS — D72819 Decreased white blood cell count, unspecified: Secondary | ICD-10-CM | POA: Diagnosis present

## 2022-07-10 DIAGNOSIS — R933 Abnormal findings on diagnostic imaging of other parts of digestive tract: Secondary | ICD-10-CM

## 2022-07-10 DIAGNOSIS — G4709 Other insomnia: Secondary | ICD-10-CM

## 2022-07-10 LAB — COMPREHENSIVE METABOLIC PANEL
ALT: 26 U/L (ref 0–44)
AST: 75 U/L — ABNORMAL HIGH (ref 15–41)
Albumin: 3.7 g/dL (ref 3.5–5.0)
Alkaline Phosphatase: 95 U/L (ref 38–126)
Anion gap: 15 (ref 5–15)
BUN: <5 mg/dL — ABNORMAL LOW (ref 6–20)
CO2: 24 mmol/L (ref 22–32)
Calcium: 9 mg/dL (ref 8.9–10.3)
Chloride: 99 mmol/L (ref 98–111)
Creatinine, Ser: 0.61 mg/dL (ref 0.61–1.24)
GFR, Estimated: >60 mL/min (ref >60)
Glucose, Bld: 82 mg/dL (ref 70–99)
Potassium: 2.7 mmol/L — CL (ref 3.5–5.1)
Sodium: 138 mmol/L (ref 135–145)
Total Bilirubin: 1 mg/dL (ref 0.3–1.2)
Total Protein: 9.3 g/dL — ABNORMAL HIGH (ref 6.5–8.1)

## 2022-07-10 LAB — CBC
HCT: 34.7 % — ABNORMAL LOW (ref 39.0–52.0)
Hemoglobin: 11.2 g/dL — ABNORMAL LOW (ref 13.0–17.0)
MCH: 28.4 pg (ref 26.0–34.0)
MCHC: 32.3 g/dL (ref 30.0–36.0)
MCV: 88.1 fL (ref 80.0–100.0)
Platelets: 117 10*3/uL — ABNORMAL LOW (ref 150–400)
RBC: 3.94 MIL/uL — ABNORMAL LOW (ref 4.22–5.81)
RDW: 17.7 % — ABNORMAL HIGH (ref 11.5–15.5)
WBC: 3.5 10*3/uL — ABNORMAL LOW (ref 4.0–10.5)
nRBC: 0 % (ref 0.0–0.2)

## 2022-07-10 LAB — MAGNESIUM: Magnesium: 1.3 mg/dL — ABNORMAL LOW (ref 1.7–2.4)

## 2022-07-10 LAB — LIPASE, BLOOD: Lipase: 116 U/L — ABNORMAL HIGH (ref 11–51)

## 2022-07-10 MED ORDER — FOLIC ACID 1 MG PO TABS
1.0000 mg | ORAL_TABLET | Freq: Every day | ORAL | 2 refills | Status: DC
  Filled 2022-07-10: qty 60, 60d supply, fill #0

## 2022-07-10 MED ORDER — LACTATED RINGERS IV BOLUS
1000.0000 mL | Freq: Once | INTRAVENOUS | Status: AC
  Administered 2022-07-11: 1000 mL via INTRAVENOUS

## 2022-07-10 MED ORDER — AMLODIPINE BESYLATE 5 MG PO TABS
5.0000 mg | ORAL_TABLET | Freq: Every day | ORAL | 0 refills | Status: DC
  Filled 2022-07-10: qty 30, 30d supply, fill #0

## 2022-07-10 MED ORDER — PANTOPRAZOLE SODIUM 40 MG PO TBEC
40.0000 mg | DELAYED_RELEASE_TABLET | Freq: Two times a day (BID) | ORAL | 0 refills | Status: DC
  Filled 2022-07-10: qty 60, 30d supply, fill #0

## 2022-07-10 MED ORDER — POTASSIUM CHLORIDE 10 MEQ/100ML IV SOLN
10.0000 meq | INTRAVENOUS | Status: AC
  Administered 2022-07-11 (×3): 10 meq via INTRAVENOUS
  Filled 2022-07-10 (×3): qty 100

## 2022-07-10 MED ORDER — POTASSIUM CHLORIDE CRYS ER 20 MEQ PO TBCR
40.0000 meq | EXTENDED_RELEASE_TABLET | Freq: Once | ORAL | Status: AC
  Administered 2022-07-11: 40 meq via ORAL
  Filled 2022-07-10: qty 2

## 2022-07-10 MED ORDER — ONDANSETRON HCL 8 MG PO TABS
8.0000 mg | ORAL_TABLET | Freq: Four times a day (QID) | ORAL | 0 refills | Status: DC | PRN
  Filled 2022-07-10: qty 30, 8d supply, fill #0

## 2022-07-10 MED ORDER — MAGNESIUM SULFATE 2 GM/50ML IV SOLN
2.0000 g | Freq: Once | INTRAVENOUS | Status: AC
  Administered 2022-07-11: 2 g via INTRAVENOUS
  Filled 2022-07-10: qty 50

## 2022-07-10 MED ORDER — CARVEDILOL 3.125 MG PO TABS
3.1250 mg | ORAL_TABLET | Freq: Two times a day (BID) | ORAL | 2 refills | Status: DC
  Filled 2022-07-10: qty 60, 30d supply, fill #0

## 2022-07-10 MED ORDER — SUCRALFATE 1 G PO TABS
1.0000 g | ORAL_TABLET | Freq: Three times a day (TID) | ORAL | 1 refills | Status: DC
  Filled 2022-07-10: qty 240, 60d supply, fill #0

## 2022-07-10 MED ORDER — HYDROXYZINE HCL 25 MG PO TABS
25.0000 mg | ORAL_TABLET | Freq: Every evening | ORAL | 0 refills | Status: DC | PRN
Start: 2022-07-10 — End: 2022-07-14
  Filled 2022-07-10: qty 30, 30d supply, fill #0

## 2022-07-10 NOTE — ED Provider Triage Note (Signed)
Emergency Medicine Provider Triage Evaluation Note  Samuel Blevins , a 57 y.o. male  was evaluated in triage.  Pt complains of abdominal pain x 48 hours, nausea and vomiting also noted blood in his stool. Given tylenol with improvement in symptoms.   Review of Systems  Positive: Abdominal pain, nausea, vomiting, blood in stool Negative: Chest pain, weakness  Physical Exam  BP (!) 148/89 (BP Location: Right Arm)   Pulse 88   Temp 98 F (36.7 C) (Oral)   Ht 6\' 1"  (1.854 m)   Wt 80 kg   SpO2 98%   BMI 23.27 kg/m  Gen:   Awake, no distress   Resp:  Normal effort  MSK:   Moves extremities without difficulty  Other:    Medical Decision Making  Medically screening exam initiated at 8:18 PM.  Appropriate orders placed.  Samuel Blevins was informed that the remainder of the evaluation will be completed by another provider, this initial triage assessment does not replace that evaluation, and the importance of remaining in the ED until their evaluation is complete.     Samuel Manges, PA-C 07/10/22 2021

## 2022-07-10 NOTE — Congregational Nurse Program (Signed)
  Dept: [phone]   Congregational Nurse Program Note  Date of Encounter: 07/10/2022  Past Medical History: Past Medical History:  Diagnosis Date   Alcohol withdrawal syndrome with complication (HCC)    Alcoholism (HCC)    Elevated AST (SGOT)    Gastrointestinal hemorrhage    Homeless    Hypertension    Pancreatic insufficiency    takes Creon   Symptomatic anemia 11/23/2015   Thrombocytopenia (HCC) 05/09/2021    Encounter Details:  CNP Questionnaire - 07/10/22 1138       Questionnaire   Ask client: Do you give verbal consent for me to treat you today? Yes    Student Assistance N/A    Location Patient Served  [facility]    Visit Setting with Client Organization    Patient Status Unhoused    Insurance Medicaid    Insurance/Financial Assistance Referral N/A    Medication N/A    Medical Provider Yes    Screening Referrals Made N/A    Medical Referrals Made [facility]    Medical Appointment Made [facility]    Recently w/o PCP, now 1st time PCP visit completed due to CNs referral or appointment made N/A    Food N/A    Transportation N/A    Housing/Utilities No permanent housing    Interpersonal Safety N/A    Interventions Advocate/Support    Abnormal to Normal Screening Since Last CN Visit N/A    Screenings CN Performed Blood Pressure;Temperature;Pulse Ox    Sent Client to Lab for: N/A    Did client attend any of the following based off CNs referral or appointments made? N/A    ED Visit Averted Yes    Life-Saving Intervention Made N/A           Writer was asked by [facility] staff to assess client as he was going to call EMS. Located client outside of building and assisted to office. Client reports he is hurting in his stomach and has vomited blood this morning. Checked vitals. Blood pressure (!) 150/75, pulse (!) 103, temperature 98.6 F (37 C), temperature source Oral, SpO2 96 %. Checked medication bag and client has only been taking his morning medications. Client  says he will only take medication in the morning. He reports he has not eaten anything this morning only drinking alcohol. Attempted to discuss detox options and client declines. Client says he will come back to see Dr [name] this afternoon. Client left office saying he was going to go smoke. Ameira Alessandrini W RN CN
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
## 2022-07-10 NOTE — ED Triage Notes (Signed)
Patient [facility]IB [facility] from [facility] c/o abd pain, n/v/d x 2 days.  Patient reports blood in vomit but is unable to elaborate whether it's dark red or bright red.  Patient was given tylenol by MD at shelter because he had a reported fever.  153/86 98% RA 83 HR 93 CBG

## 2022-07-11 ENCOUNTER — Emergency Department (HOSPITAL_COMMUNITY): Payer: Medicaid Other

## 2022-07-11 ENCOUNTER — Other Ambulatory Visit: Payer: Self-pay

## 2022-07-11 ENCOUNTER — Encounter: Payer: Self-pay | Admitting: *Deleted

## 2022-07-11 DIAGNOSIS — F17210 Nicotine dependence, cigarettes, uncomplicated: Secondary | ICD-10-CM | POA: Diagnosis present

## 2022-07-11 DIAGNOSIS — N390 Urinary tract infection, site not specified: Secondary | ICD-10-CM | POA: Diagnosis present

## 2022-07-11 DIAGNOSIS — F1010 Alcohol abuse, uncomplicated: Secondary | ICD-10-CM

## 2022-07-11 DIAGNOSIS — E876 Hypokalemia: Secondary | ICD-10-CM | POA: Diagnosis present

## 2022-07-11 DIAGNOSIS — K219 Gastro-esophageal reflux disease without esophagitis: Secondary | ICD-10-CM | POA: Diagnosis present

## 2022-07-11 DIAGNOSIS — K760 Fatty (change of) liver, not elsewhere classified: Secondary | ICD-10-CM | POA: Diagnosis present

## 2022-07-11 DIAGNOSIS — K921 Melena: Secondary | ICD-10-CM

## 2022-07-11 DIAGNOSIS — K852 Alcohol induced acute pancreatitis without necrosis or infection: Secondary | ICD-10-CM | POA: Diagnosis not present

## 2022-07-11 DIAGNOSIS — K860 Alcohol-induced chronic pancreatitis: Secondary | ICD-10-CM | POA: Diagnosis present

## 2022-07-11 DIAGNOSIS — K920 Hematemesis: Secondary | ICD-10-CM | POA: Diagnosis not present

## 2022-07-11 DIAGNOSIS — K861 Other chronic pancreatitis: Secondary | ICD-10-CM

## 2022-07-11 DIAGNOSIS — Z79899 Other long term (current) drug therapy: Secondary | ICD-10-CM | POA: Diagnosis not present

## 2022-07-11 DIAGNOSIS — R251 Tremor, unspecified: Secondary | ICD-10-CM | POA: Diagnosis present

## 2022-07-11 DIAGNOSIS — D72819 Decreased white blood cell count, unspecified: Secondary | ICD-10-CM | POA: Diagnosis present

## 2022-07-11 DIAGNOSIS — D6959 Other secondary thrombocytopenia: Secondary | ICD-10-CM | POA: Diagnosis present

## 2022-07-11 DIAGNOSIS — Z5901 Sheltered homelessness: Secondary | ICD-10-CM | POA: Diagnosis not present

## 2022-07-11 DIAGNOSIS — K2980 Duodenitis without bleeding: Secondary | ICD-10-CM | POA: Diagnosis present

## 2022-07-11 DIAGNOSIS — R1013 Epigastric pain: Secondary | ICD-10-CM

## 2022-07-11 DIAGNOSIS — Y904 Blood alcohol level of 80-99 mg/100 ml: Secondary | ICD-10-CM | POA: Diagnosis present

## 2022-07-11 DIAGNOSIS — R109 Unspecified abdominal pain: Secondary | ICD-10-CM | POA: Diagnosis present

## 2022-07-11 DIAGNOSIS — R1084 Generalized abdominal pain: Secondary | ICD-10-CM | POA: Diagnosis present

## 2022-07-11 DIAGNOSIS — R933 Abnormal findings on diagnostic imaging of other parts of digestive tract: Secondary | ICD-10-CM | POA: Diagnosis not present

## 2022-07-11 DIAGNOSIS — D696 Thrombocytopenia, unspecified: Secondary | ICD-10-CM

## 2022-07-11 DIAGNOSIS — I10 Essential (primary) hypertension: Secondary | ICD-10-CM | POA: Diagnosis present

## 2022-07-11 LAB — URINALYSIS, ROUTINE W REFLEX MICROSCOPIC
Bilirubin Urine: NEGATIVE
Glucose, UA: NEGATIVE mg/dL
Ketones, ur: 20 mg/dL — AB
Nitrite: POSITIVE — AB
Protein, ur: 100 mg/dL — AB
Specific Gravity, Urine: >1.046 — ABNORMAL HIGH (ref 1.005–1.030)
pH: 5 (ref 5.0–8.0)

## 2022-07-11 LAB — CBC WITH DIFFERENTIAL/PLATELET
Abs Immature Granulocytes: 0.01 10*3/uL (ref 0.00–0.07)
Basophils Absolute: 0 10*3/uL (ref 0.0–0.1)
Basophils Relative: 1 %
Eosinophils Absolute: 0.1 10*3/uL (ref 0.0–0.5)
Eosinophils Relative: 1 %
HCT: 30.5 % — ABNORMAL LOW (ref 39.0–52.0)
Hemoglobin: 10.1 g/dL — ABNORMAL LOW (ref 13.0–17.0)
Immature Granulocytes: 0 %
Lymphocytes Relative: 25 %
Lymphs Abs: 1.1 10*3/uL (ref 0.7–4.0)
MCH: 28.9 pg (ref 26.0–34.0)
MCHC: 33.1 g/dL (ref 30.0–36.0)
MCV: 87.4 fL (ref 80.0–100.0)
Monocytes Absolute: 0.5 10*3/uL (ref 0.1–1.0)
Monocytes Relative: 12 %
Neutro Abs: 2.7 10*3/uL (ref 1.7–7.7)
Neutrophils Relative %: 61 %
Platelets: 96 10*3/uL — ABNORMAL LOW (ref 150–400)
RBC: 3.49 MIL/uL — ABNORMAL LOW (ref 4.22–5.81)
RDW: 17.9 % — ABNORMAL HIGH (ref 11.5–15.5)
WBC: 4.4 10*3/uL (ref 4.0–10.5)
nRBC: 0 % (ref 0.0–0.2)

## 2022-07-11 LAB — COMPREHENSIVE METABOLIC PANEL
ALT: 25 U/L (ref 0–44)
AST: 69 U/L — ABNORMAL HIGH (ref 15–41)
Albumin: 3.2 g/dL — ABNORMAL LOW (ref 3.5–5.0)
Alkaline Phosphatase: 83 U/L (ref 38–126)
Anion gap: 11 (ref 5–15)
BUN: <5 mg/dL — ABNORMAL LOW (ref 6–20)
CO2: 24 mmol/L (ref 22–32)
Calcium: 8.5 mg/dL — ABNORMAL LOW (ref 8.9–10.3)
Chloride: 99 mmol/L (ref 98–111)
Creatinine, Ser: 0.63 mg/dL (ref 0.61–1.24)
GFR, Estimated: >60 mL/min (ref >60)
Glucose, Bld: 76 mg/dL (ref 70–99)
Potassium: 3.4 mmol/L — ABNORMAL LOW (ref 3.5–5.1)
Sodium: 134 mmol/L — ABNORMAL LOW (ref 135–145)
Total Bilirubin: 1.2 mg/dL (ref 0.3–1.2)
Total Protein: 7.6 g/dL (ref 6.5–8.1)

## 2022-07-11 LAB — ETHANOL: Alcohol, Ethyl (B): 88 mg/dL — ABNORMAL HIGH (ref <10)

## 2022-07-11 LAB — LIPASE, BLOOD: Lipase: 167 U/L — ABNORMAL HIGH (ref 11–51)

## 2022-07-11 LAB — LACTIC ACID, PLASMA: Lactic Acid, Venous: 1.6 mmol/L (ref 0.5–1.9)

## 2022-07-11 LAB — MAGNESIUM: Magnesium: 1.7 mg/dL (ref 1.7–2.4)

## 2022-07-11 MED ORDER — LORAZEPAM 1 MG PO TABS
0.0000 mg | ORAL_TABLET | Freq: Four times a day (QID) | ORAL | Status: DC
  Administered 2022-07-11 – 2022-07-12 (×2): 2 mg via ORAL
  Administered 2022-07-12 (×2): 1 mg via ORAL
  Administered 2022-07-13: 2 mg via ORAL
  Filled 2022-07-11 (×2): qty 2
  Filled 2022-07-11: qty 1
  Filled 2022-07-11 (×2): qty 2
  Filled 2022-07-11: qty 1

## 2022-07-11 MED ORDER — FOLIC ACID 1 MG PO TABS
1.0000 mg | ORAL_TABLET | Freq: Every day | ORAL | Status: DC
  Administered 2022-07-11 – 2022-07-14 (×4): 1 mg via ORAL
  Filled 2022-07-11 (×4): qty 1

## 2022-07-11 MED ORDER — PANTOPRAZOLE SODIUM 40 MG PO TBEC: 40.0000 mg | DELAYED_RELEASE_TABLET | Freq: Two times a day (BID) | ORAL | Status: DC

## 2022-07-11 MED ORDER — LACTATED RINGERS IV SOLN: INTRAVENOUS | Status: DC

## 2022-07-11 MED ORDER — PANTOPRAZOLE SODIUM 40 MG IV SOLR
40.0000 mg | Freq: Two times a day (BID) | INTRAVENOUS | Status: DC
  Administered 2022-07-11 – 2022-07-13 (×5): 40 mg via INTRAVENOUS
  Filled 2022-07-11 (×5): qty 10

## 2022-07-11 MED ORDER — HYDROMORPHONE HCL 1 MG/ML IJ SOLN
0.5000 mg | INTRAMUSCULAR | Status: DC | PRN
  Administered 2022-07-11 – 2022-07-14 (×8): 0.5 mg via INTRAVENOUS
  Filled 2022-07-11 (×7): qty 0.5

## 2022-07-11 MED ORDER — NICOTINE 14 MG/24HR TD PT24
14.0000 mg | MEDICATED_PATCH | Freq: Every day | TRANSDERMAL | Status: DC
  Administered 2022-07-11 – 2022-07-14 (×4): 14 mg via TRANSDERMAL
  Filled 2022-07-11 (×4): qty 1

## 2022-07-11 MED ORDER — ONDANSETRON HCL 4 MG/2ML IJ SOLN
4.0000 mg | Freq: Once | INTRAMUSCULAR | Status: AC
  Administered 2022-07-11: 4 mg via INTRAVENOUS
  Filled 2022-07-11: qty 2

## 2022-07-11 MED ORDER — THIAMINE MONONITRATE 100 MG PO TABS
100.0000 mg | ORAL_TABLET | Freq: Every day | ORAL | Status: DC
  Administered 2022-07-11 – 2022-07-14 (×3): 100 mg via ORAL
  Filled 2022-07-11 (×3): qty 1

## 2022-07-11 MED ORDER — AMLODIPINE BESYLATE 5 MG PO TABS
5.0000 mg | ORAL_TABLET | Freq: Every day | ORAL | Status: DC
  Administered 2022-07-11 – 2022-07-14 (×4): 5 mg via ORAL
  Filled 2022-07-11 (×4): qty 1

## 2022-07-11 MED ORDER — LORAZEPAM 2 MG/ML IJ SOLN
1.0000 mg | INTRAMUSCULAR | Status: AC | PRN
  Administered 2022-07-13: 4 mg via INTRAVENOUS
  Filled 2022-07-11 (×2): qty 1

## 2022-07-11 MED ORDER — SUCRALFATE 1 G PO TABS
1.0000 g | ORAL_TABLET | Freq: Three times a day (TID) | ORAL | Status: DC
  Administered 2022-07-11 – 2022-07-12 (×5): 1 g via ORAL
  Filled 2022-07-11 (×5): qty 1

## 2022-07-11 MED ORDER — THIAMINE HCL 100 MG/ML IJ SOLN
100.0000 mg | Freq: Every day | INTRAMUSCULAR | Status: DC
  Administered 2022-07-13: 100 mg via INTRAVENOUS
  Filled 2022-07-11 (×2): qty 2

## 2022-07-11 MED ORDER — LORAZEPAM 1 MG PO TABS: 0.0000 mg | ORAL_TABLET | Freq: Two times a day (BID) | ORAL | Status: DC

## 2022-07-11 MED ORDER — HEPARIN SODIUM (PORCINE) 5000 UNIT/ML IJ SOLN: 5000.0000 [IU] | Freq: Three times a day (TID) | INTRAMUSCULAR | Status: DC

## 2022-07-11 MED ORDER — LORAZEPAM 1 MG PO TABS
1.0000 mg | ORAL_TABLET | ORAL | Status: AC | PRN
  Administered 2022-07-11: 1 mg via ORAL
  Administered 2022-07-13 (×2): 2 mg via ORAL
  Filled 2022-07-11 (×2): qty 2

## 2022-07-11 MED ORDER — ADULT MULTIVITAMIN W/MINERALS CH
1.0000 | ORAL_TABLET | Freq: Every day | ORAL | Status: DC
  Administered 2022-07-11 – 2022-07-14 (×3): 1 via ORAL
  Filled 2022-07-11 (×3): qty 1

## 2022-07-11 MED ORDER — ONDANSETRON HCL 4 MG/2ML IJ SOLN: 4.0000 mg | Freq: Four times a day (QID) | INTRAMUSCULAR | Status: DC | PRN

## 2022-07-11 MED ORDER — IOHEXOL 350 MG/ML SOLN
75.0000 mL | Freq: Once | INTRAVENOUS | Status: AC | PRN
  Administered 2022-07-11: 75 mL via INTRAVENOUS

## 2022-07-11 MED ORDER — POTASSIUM CHLORIDE 10 MEQ/100ML IV SOLN
10.0000 meq | Freq: Once | INTRAVENOUS | Status: AC
  Administered 2022-07-11: 10 meq via INTRAVENOUS
  Filled 2022-07-11: qty 100

## 2022-07-11 MED ORDER — OXYCODONE HCL 5 MG PO TABS
5.0000 mg | ORAL_TABLET | ORAL | Status: DC | PRN
  Administered 2022-07-11 – 2022-07-14 (×4): 5 mg via ORAL
  Filled 2022-07-11 (×4): qty 1

## 2022-07-11 MED ORDER — HYDROMORPHONE HCL 1 MG/ML IJ SOLN
1.0000 mg | Freq: Once | INTRAMUSCULAR | Status: AC
  Administered 2022-07-11: 1 mg via INTRAVENOUS
  Filled 2022-07-11: qty 1

## 2022-07-11 NOTE — ED Notes (Signed)
ED TO INPATIENT HANDOFF REPORT  ED Nurse Name and Phone #: Irby Fails [phone]  S Name/Age/Gender Samuel Blevins 57 y.o. male Room/Bed: H021C/H021C  Code Status   Code Status: Full Code  Home/SNF/Other Home Patient oriented to: self, place, time, and situation Is this baseline? Yes   Triage Complete: Triage complete  Chief Complaint Abdominal pain [R10.9]  Triage Note Patient BIB [facility] from homeless shelter c/o abd pain, n/v/d x 2 days.  Patient reports blood in vomit but is unable to elaborate whether it's dark red or bright red.  Patient was given tylenol by MD at shelter because he had a reported fever.  153/86 98% RA 83 HR 93 CBG   Allergies No Known Allergies  Level of Care/Admitting Diagnosis ED Disposition     ED Disposition  Admit   Condition  --   Comment  Hospital Area: [facility] [[id number]]  Level of Care: Telemetry Surgical [105]  May admit patient to [facility] or [facility] if equivalent level of care is available:: Yes  Covid Evaluation: Asymptomatic - no recent exposure (last 10 days) testing not required  Diagnosis: Abdominal pain [213086]  Admitting Physician: Darlin Drop [[id number]]  Attending Physician: Darlin Drop [[id number]]  Certification:: I certify this patient will need inpatient services for at least 2 midnights  Estimated Length of Stay: 2          B Medical/Surgery History Past Medical History:  Diagnosis Date   Alcohol withdrawal syndrome with complication (HCC)    Alcoholism (HCC)    Elevated AST (SGOT)    Gastrointestinal hemorrhage    Homeless    Hypertension    Pancreatic insufficiency    takes Creon   Symptomatic anemia 11/23/2015   Thrombocytopenia (HCC) 05/09/2021   Past Surgical History:  Procedure Laterality Date   BIOPSY  06/23/2019   Procedure: BIOPSY;  Surgeon: Shellia Cleverly, DO;  Location: [facility] ENDOSCOPY;  Service: Gastroenterology;;   BIOPSY  12/12/2020   Procedure: BIOPSY;   Surgeon: Lynann Bologna, MD;  Location: [facility] ENDOSCOPY;  Service: Endoscopy;;   BIOPSY  08/04/2021   Procedure: BIOPSY;  Surgeon: Jenel Lucks, MD;  Location: [facility] ENDOSCOPY;  Service: Gastroenterology;;   ESOPHAGOGASTRODUODENOSCOPY N/A 08/04/2021   Procedure: ESOPHAGOGASTRODUODENOSCOPY (EGD);  Surgeon: Jenel Lucks, MD;  Location: [facility] ENDOSCOPY;  Service: Gastroenterology;  Laterality: N/A;   ESOPHAGOGASTRODUODENOSCOPY (EGD) WITH PROPOFOL N/A 06/23/2019   Procedure: ESOPHAGOGASTRODUODENOSCOPY (EGD) WITH PROPOFOL;  Surgeon: Shellia Cleverly, DO;  Location: [facility] ENDOSCOPY;  Service: Gastroenterology;  Laterality: N/A;   ESOPHAGOGASTRODUODENOSCOPY (EGD) WITH PROPOFOL N/A 12/12/2020   Procedure: ESOPHAGOGASTRODUODENOSCOPY (EGD) WITH PROPOFOL;  Surgeon: Lynann Bologna, MD;  Location: [facility] ENDOSCOPY;  Service: Endoscopy;  Laterality: N/A;   HOT HEMOSTASIS N/A 06/23/2019   Procedure: HOT HEMOSTASIS (ARGON PLASMA COAGULATION/BICAP);  Surgeon: Shellia Cleverly, DO;  Location: [facility] ENDOSCOPY;  Service: Gastroenterology;  Laterality: N/A;     A IV Location/Drains/Wounds Patient Lines/Drains/Airways Status     Active Line/Drains/Airways     Name Placement date Placement time Site Days   Peripheral IV 07/10/22 20 G Left;Posterior Hand 07/10/22  2349  Hand  1            Intake/Output Last 24 hours  Intake/Output Summary (Last 24 hours) at 07/11/2022 0322 Last data filed at 07/11/2022 0232 Gross per 24 hour  Intake 1100 ml  Output --  Net 1100 ml    Labs/Imaging Results for orders placed or performed during the hospital encounter of 07/10/22 (  from the past 48 hour(s))  Lipase, blood     Status: Abnormal   Collection Time: 07/10/22  8:15 PM  Result Value Ref Range   Lipase 116 (H) 11 - 51 U/L    Comment: Performed at [facility], [address] [place], [place] [address]  Comprehensive metabolic panel     Status: Abnormal   Collection Time: 07/10/22  8:15 PM  Result Value Ref  Range   Sodium 138 135 - 145 mmol/L   Potassium 2.7 (LL) 3.5 - 5.1 mmol/L    Comment: CRITICAL RESULT CALLED TO, READ BACK BY AND VERIFIED WITH Charlesetta Garibaldi, RN. 2158 07/10/22. LPAIT   Chloride 99 98 - 111 mmol/L   CO2 24 22 - 32 mmol/L   Glucose, Bld 82 70 - 99 mg/dL    Comment: Glucose reference range applies only to samples taken after fasting for at least 8 hours.   BUN <5 (L) 6 - 20 mg/dL   Creatinine, Ser 8.29 0.61 - 1.24 mg/dL   Calcium 9.0 8.9 - 56.2 mg/dL   Total Protein 9.3 (H) 6.5 - 8.1 g/dL   Albumin 3.7 3.5 - 5.0 g/dL   AST 75 (H) 15 - 41 U/L   ALT 26 0 - 44 U/L   Alkaline Phosphatase 95 38 - 126 U/L   Total Bilirubin 1.0 0.3 - 1.2 mg/dL   GFR, Estimated >13 >08 mL/min    Comment: (NOTE) Calculated using the CKD-EPI Creatinine Equation (2021)    Anion gap 15 5 - 15    Comment: Performed at [facility], [address], [place], [place] [address]  CBC     Status: Abnormal   Collection Time: 07/10/22  8:15 PM  Result Value Ref Range   WBC 3.5 (L) 4.0 - 10.5 K/uL   RBC 3.94 (L) 4.22 - 5.81 MIL/uL   Hemoglobin 11.2 (L) 13.0 - 17.0 g/dL   HCT 69.6 (L) 29.5 - 28.4 %   MCV 88.1 80.0 - 100.0 fL   MCH 28.4 26.0 - 34.0 pg   MCHC 32.3 30.0 - 36.0 g/dL   RDW 13.2 (H) 44.0 - 10.2 %   Platelets 117 (L) 150 - 400 K/uL    Comment: REPEATED TO VERIFY   nRBC 0.0 0.0 - 0.2 %    Comment: Performed at [facility], [address] [place], [place] [address]  Magnesium     Status: Abnormal   Collection Time: 07/10/22  9:10 PM  Result Value Ref Range   Magnesium 1.3 (L) 1.7 - 2.4 mg/dL    Comment: Performed at [facility], [address] [place], [place] [address]  Urinalysis, Routine w reflex microscopic -Urine, Clean Catch     Status: Abnormal   Collection Time: 07/11/22  2:47 AM  Result Value Ref Range   Color, Urine YELLOW YELLOW   APPearance CLEAR CLEAR   Specific Gravity, Urine >1.046 (H) 1.005 - 1.030   pH 5.0 5.0 - 8.0   Glucose, UA NEGATIVE NEGATIVE  mg/dL   Hgb urine dipstick SMALL (A) NEGATIVE   Bilirubin Urine NEGATIVE NEGATIVE   Ketones, ur 20 (A) NEGATIVE mg/dL   Protein, ur 474 (A) NEGATIVE mg/dL   Nitrite POSITIVE (A) NEGATIVE   Leukocytes,Ua SMALL (A) NEGATIVE   RBC / HPF 0-5 0 - 5 RBC/hpf   WBC, UA 11-20 0 - 5 WBC/hpf   Bacteria, UA RARE (A) NONE SEEN   Squamous Epithelial / HPF 0-5  0 - 5 /HPF   Mucus PRESENT     Comment: Performed at [facility], [address] [place], [place] [address]   DG Chest 2 View  Result Date: 07/11/2022 CLINICAL DATA:  Cough EXAM: CHEST - 2 VIEW COMPARISON:  06/16/2022 FINDINGS: The lungs are symmetrically well expanded. Previously noted right basilar consolidation and pleural effusion have resolved. The lungs are clear save for minimal left basilar atelectasis. No pneumothorax. Cardiac size within normal limits. Pulmonary vascularity is normal. IMPRESSION: 1. No active cardiopulmonary disease. Minimal left basilar atelectasis. Electronically Signed   By: Helyn Numbers M.D.   On: 07/11/2022 00:59   CT ABDOMEN PELVIS W CONTRAST  Result Date: 07/11/2022 CLINICAL DATA:  Abdominal pain and hematemesis.  Subjective fever. EXAM: CT ABDOMEN AND PELVIS WITH CONTRAST TECHNIQUE: Multidetector CT imaging of the abdomen and pelvis was performed using the standard protocol following bolus administration of intravenous contrast. RADIATION DOSE REDUCTION: This exam was performed according to the departmental dose-optimization program which includes automated exposure control, adjustment of the mA and/or kV according to patient size and/or use of iterative reconstruction technique. CONTRAST:  75mL OMNIPAQUE IOHEXOL 350 MG/ML SOLN COMPARISON:  CT abdomen and pelvis with IV contrast 06/14/2022, CTA chest, abdomen and pelvis 06/10/2022, CT abdomen pelvis with IV contrast 06/07/2022. FINDINGS: Lower chest: Prior bilateral basal pulmonary infiltrates have resolved since last month. There is mild lower bronchial  thickening without bronchial plugging. Scattered linear atelectasis or scarring without acute process. The cardiac size is normal. Hepatobiliary: Diffusely steatotic liver measuring 19 cm length. No mass enhancement. Unremarkable gallbladder and bile ducts. Pancreas: Interval increased inflammatory stranding around the head and uncinate process of the pancreas, with trace fluid and additional inflammatory change in the pancreaticoduodenal groove. There are also significant inflammatory changes around the adjacent descending duodenum. Findings could all be due to pancreatitis, or could be due to a combination of duodenitis with or without duodenal ulcerative disease, and pancreatitis. Laboratory and clinical correlation advised. There are parenchymal calcifications again in the pancreatic head compatible with chronic calcific pancreatitis. The rest of the pancreas is unremarkable and there is no ductal dilatation or localizing fluid collection. There is no focal pancreatic hypoenhancement to suspect pancreatic necrosis. Spleen: Normal. Adrenals/Urinary Tract: No adrenal hemorrhage or renal injury identified. There is mild bladder thickening versus underdistention. There is an impression into the inferior bladder by the prostate. Stomach/Bowel: There are chronic thickened folds in the stomach. There is interval increased severe fold thickening in the descending duodenum, which could be due to severe duodenitis, duodenal ulcerative disease, or infiltrating disease. As above there are increased moderate inflammatory changes around the descending duodenum. The horizontal segment is only mildly thickened. The rest of the small bowel is unremarkable. An appendix is not seen in this patient. The large bowel unremarkable apart from diverticulosis. Vascular/Lymphatic: The hepatic portal vein, splenic vein and SMV are clear. There is aortoiliac atherosclerotic calcific plaque. There are borderline prominent reactive periportal  lymph nodes up to 9 mm in short axis. No other adenopathy is seen. Reproductive: The prostate is not enlarged. Other: Scattered pelvic phleboliths. Prior umbilical hernia repair with small supraumbilical midline anterior wall fat hernia. There is scattered peripancreatic nonlocalizing fluid but no focal fluid collection. There is no free air, abscess, free hemorrhage or incarcerated hernia. Musculoskeletal: Advanced degenerative disc disease L4-5 and L5-S1 with facet DJD. Bilateral SI joint bridging osteophytes. No acute or other significant osseous findings. IMPRESSION: 1. Interval increased inflammatory changes around the head  and uncinate process of the pancreas, with increased inflammatory changes around the descending duodenum and increased severe duodenal thickening. Findings could be all due to acute pancreatitis, duodenitis with or without duodenal ulcerative disease, or combination. 2. 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 3. Hepatic steatosis. 4. Aortic atherosclerosis. 5. Cystitis versus bladder nondistention. 6. Diverticulosis. 7. Umbilical hernia repair with small supraumbilical midline anterior wall fat hernia. 8. Interval resolution of prior bilateral basal pulmonary infiltrates since the last CT. Aortic Atherosclerosis (ICD10-I70.0). Electronically Signed   By: Almira Bar M.D.   On: 07/11/2022 00:34    Pending Labs Unresulted Labs (From admission, onward)     Start     Ordered   07/11/22 0135  Ethanol  Once,   URGENT        07/11/22 0134   07/11/22 0023  Lactic acid, plasma  Now then every 2 hours,   R (with STAT occurrences)      07/11/22 0022            Vitals/Pain Today's Vitals   07/10/22 1941 07/11/22 0102 07/11/22 0306 07/11/22 0306  BP: (!) 148/89   131/74  Pulse: 88   100  Resp:    16  Temp: 98 F (36.7 C) 97.7 F (36.5 C)    TempSrc: Oral Oral    SpO2: 98%   92%  Weight:      Height:      PainSc:   10-Worst pain ever     Isolation  Precautions No active isolations  Medications Medications  potassium chloride 10 mEq in 100 mL IVPB (10 mEq Intravenous New Bag/Given 07/11/22 0304)  magnesium sulfate IVPB 2 g 50 mL (2 g Intravenous New Bag/Given 07/11/22 0304)  potassium chloride SA (KLOR-CON M) CR tablet 40 mEq (40 mEq Oral Given 07/11/22 0105)  lactated ringers bolus 1,000 mL (0 mLs Intravenous Stopped 07/11/22 0231)  iohexol (OMNIPAQUE) 350 MG/ML injection 75 mL (75 mLs Intravenous Contrast Given 07/11/22 0002)  ondansetron (ZOFRAN) injection 4 mg (4 mg Intravenous Given 07/11/22 0105)  HYDROmorphone (DILAUDID) injection 1 mg (1 mg Intravenous Given 07/11/22 0255)    Mobility walks with device     Focused Assessments Alert and oriented.  C/o abdominal pain, nausea, and vomiting   R Recommendations: See Admitting Provider Note  Report given to:   Additional Notes: Will need Potassium 10 meq IV x 2 to complete a total of 40 meq IV

## 2022-07-11 NOTE — Congregational Nurse Program (Signed)
  Dept: [phone]   Congregational Nurse Program Note  Date of Encounter: 07/11/2022  Past Medical History: Past Medical History:  Diagnosis Date   Alcohol withdrawal syndrome with complication (HCC)    Alcoholism (HCC)    Elevated AST (SGOT)    Gastrointestinal hemorrhage    Homeless    Hypertension    Pancreatic insufficiency    takes Creon   Symptomatic anemia 11/23/2015   Thrombocytopenia (HCC) 05/09/2021    Encounter Details:  CNP Questionnaire - 07/11/22 1316       Questionnaire   Ask client: Do you give verbal consent for me to treat you today? N/A    Student Assistance N/A    Location Patient Served  [facility]    Visit Setting with Client Organization    Patient Status Unhoused    Insurance Medicaid    Insurance/Financial Assistance Referral N/A    Medication Have Medication Insecurities;Provided Medication Assistance    Medical Provider Yes    Screening Referrals Made N/A    Medical Referrals Made N/A    Medical Appointment Made N/A    Recently w/o PCP, now 1st time PCP visit completed due to CNs referral or appointment made N/A    Food N/A    Transportation N/A    Housing/Utilities No permanent housing    Interpersonal Safety N/A    Interventions Advocate/Support    Abnormal to Normal Screening Since Last CN Visit N/A    Screenings CN Performed N/A    Sent Client to Lab for: N/A    Did client attend any of the following based off CNs referral or appointments made? N/A    ED Visit Averted N/A    Life-Saving Intervention Made N/A            Medication picked up at [facility] and brought to [facility] per MD request. Client not at [facility] today. Placed medication in client's medication bag. Terrin Meddaugh W RN CN

## 2022-07-11 NOTE — TOC Initial Note (Addendum)
Transition of Care [facility]) - Initial/Assessment Note    Patient Details  Name: Samuel Blevins MRN: [id number] Date of Birth: [birth year]  Transition of Care [facility]) CM/SW Contact:    Samuel Blevins, Samuel Blevins Phone Number: 07/11/2022, 3:46 PM  Clinical Narrative:                CSW  gave bus passes to pt's nurse to assist pt at discharge.  She will provide to pt at bedside.    No other TOC needs. Please consult TOC if other needs arise.    CSW met with pt at bedside.  CSW asked pt about his history of alcohol use.   He reported that "I drink" but stated he does not think its a problem. He reports that he only drinks about "a pint or half a pint" of Vodka every day or other day.   CSW asked about past attempts to slow down or alter his drinking when if ever, he said that he attends AA meetings at the shelter where he is currently living. CSW left alcohol and substance use resources along with information for outpatient SUD counseling with pt with his permission.   Pt lives at [facility]. He said that staff there are currently assisting him with obtaining a voucher for a more long term living arrangement.    CSW will provide pt with bus pass at discharge as pt said he would need transportation assistance.   TOC will continue to follow.     Expected Discharge Plan: Homeless Shelter Barriers to Discharge: Continued Medical Work up   Patient Goals and CMS Choice Patient states their goals for this hospitalization and ongoing recovery are:: get back home          Expected Discharge Plan and Services In-house Referral: Clinical Social Work     Living arrangements for the past 2 months: Homeless Shelter                                      Prior Living Arrangements/Services Living arrangements for the past 2 months: Homeless Shelter Lives with:: Other (Comment) (residents of the homeless shelter)   Do you feel safe going back to the place  where you live?: Yes      Need for Family Participation in Patient Care: No (Comment) Care giver support system in place?: No (comment)   Criminal Activity/Legal Involvement Pertinent to Current Situation/Hospitalization: No - Comment as needed  Activities of Daily Living Home Assistive Devices/Equipment: Walker (specify type) (four wheel rolling walker) ADL Screening (condition at time of admission) Patient's cognitive ability adequate to safely complete daily activities?: Yes Is the patient deaf or have difficulty hearing?: No Does the patient have difficulty seeing, even when wearing glasses/contacts?: No Does the patient have difficulty concentrating, remembering, or making decisions?: No Patient able to express need for assistance with ADLs?: Yes Does the patient have difficulty dressing or bathing?: No Independently performs ADLs?: Yes (appropriate for developmental age) Does the patient have difficulty walking or climbing stairs?: Yes Weakness of Legs: Both Weakness of Arms/Hands: None  Permission Sought/Granted                  Emotional Assessment Appearance:: Appears older than stated age Attitude/Demeanor/Rapport: Lethargic Affect (typically observed): Irritable Orientation: : Oriented to Self, Oriented to Place, Oriented to  Time, Oriented to Situation Alcohol / Substance Use: Alcohol Use Psych  Involvement: No (comment)  Admission diagnosis:  Hypokalemia [E87.6] Hypomagnesemia [E83.42] Abdominal pain [R10.9] Alcohol-induced acute pancreatitis, unspecified complication status [K85.20] Patient Active Problem List   Diagnosis Date Noted   Abdominal pain 07/11/2022   Abdominal pain, epigastric 07/11/2022   Community acquired pneumonia 06/14/2022   Multifocal pneumonia 06/10/2022   AKI (acute kidney injury) (HCC) 06/10/2022   Encephalopathy acute 06/10/2022   Sepsis (HCC) 06/10/2022   Acute on chronic pancreatitis (HCC) 06/10/2022   Acute kidney injury  (nontraumatic) (HCC) 06/10/2022   Fall 04/16/2022   Alcohol withdrawal with inpatient treatment with perceptual disturbance (HCC) 04/13/2022   Cervical spine fracture (HCC) 01/31/2022   Pancytopenia (HCC) 01/31/2022   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   Hypokalemia    Hypomagnesemia    Portal vein thrombosis 08/03/2021   Alcoholic pancreatitis 08/03/2021   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   Anxiety and depression 07/04/2021   Homeless 07/04/2021   Osteoarthritis 07/04/2021   Thrombocytopenia (HCC) 05/09/2021   Pancreatic insufficiency 05/08/2021   Chronic alcohol abuse 05/07/2021   Alcoholic steatohepatitis 12/07/2020   Iron deficiency anemia due to chronic blood loss 12/07/2020   Seizure (HCC) 12/04/2020   Acute pancreatitis 09/16/2020   Alcohol withdrawal (HCC) 09/16/2020   Chronic pain syndrome 12/28/2019   Pancreatic pseudocyst 10/28/2019   Gastritis and gastroduodenitis    AVM (arteriovenous malformation) of small bowel, acquired    Transaminitis    Nausea and vomiting in adult    Hematemesis with nausea    Alcohol dependence syndrome (HCC) 06/21/2019   Chronic anemia 11/23/2015   Tobacco abuse 03/17/2014   Essential hypertension 03/17/2014   PCP:  [name], MD Pharmacy:   [facility] [address] [place] - [address] [place] [address] Phone: [phone] Fax: [phone]  [place] [facility] [address] [place] [address] Phone: [phone] Fax: [phone]  [facility] [address] [place] [address] Phone: [phone] Fax: [phone]  [facility] [address] [place] [address] Phone: [phone] Fax: [phone]     Social Determinants of Health (SDOH) Social History: SDOH Screenings   Food  Insecurity: Food Insecurity Present (07/11/2022)  Housing: Medium Risk (07/11/2022)  Transportation Needs: No Transportation Needs (07/11/2022)  Utilities: Patient Declined (07/11/2022)  Depression (PHQ2-9): High Risk (02/14/2022)  Tobacco Use: High Risk (07/10/2022)   SDOH Interventions:     Readmission Risk Interventions     No data to display

## 2022-07-11 NOTE — H&P (Signed)
History and Physical  Patient: Samuel Blevins:[id number] DOB: 1965/08/25 DOA: 07/10/2022 DOS: the patient was seen and examined on 07/11/2022 Patient coming from: Home  Chief Complaint: Abdominal pain nausea and vomiting. ED TRIAGE note : "Patient BIB [facility] from homeless shelter c/o abd pain, n/v/d x 2 days.  Patient reports blood in vomit but is unable to elaborate whether it's dark red or bright red.  Patient was given tylenol by MD at shelter because he had a reported fever.  153/86 98% RA 83 HR 93 CBG " HPI: Samuel Blevins is a 57 y.o. male with PMH significant of alcohol abuse, chronic abdominal pain, recurrent pancreatitis, HTN, pancytopenia presented to hospital with complaints of abdominal pain nausea and vomiting. Patient is homeless.  Drinks 1 pint of vodka on a daily basis. Was recently hospitalized in April 2024 for pancreatitis. Reports that his abdominal pain has been ongoing for last 3 days.  He also starts having nausea and vomiting for the same time.  There is report of vomiting of blood but currently no vomiting no blood in the hospital reported by the patient. Passing gas and had a BM this morning. Denies having any fever or chills.  No chest pain.  No headache. Patient's last drink was on the day of the admission on 5/15. Patient reports that he has plans to quit but has not been able to do so in the past. No focal deficit reported.  No dizziness or provide the patient. Patient does have some tremors of bilateral upper extremities which she thinks has been chronic. he denies any lower abdominal pain or any burning urination or any blood in the urine. Review of Systems: As mentioned in the history of present illness. All other systems reviewed and are negative.  Prior to Admission medications   Medication Sig Start Date End Date Taking? Authorizing Provider  acetaminophen (TYLENOL) 325 MG tablet Take 2 tablets (650 mg total) by mouth every 6 (six) hours as  needed for fever, moderate pain, mild pain or headache. 12/12/20   Katsadouros, Vasilios, MD  amLODipine (NORVASC) 5 MG tablet Take 1 tablet (5 mg total) by mouth daily. 07/10/22   [name], MD  carvedilol (COREG) 3.125 MG tablet Take 1 tablet (3.125 mg total) by mouth 2 (two) times daily with a meal. 07/10/22   [name], MD  clobetasol cream (TEMOVATE) 0.05 % Apply 1 Application topically 2 (two) times daily. 03/06/22   [name], MD  folic acid (FOLVITE) 1 MG tablet Take 1 tablet (1 mg total) by mouth daily. 07/10/22   [name], MD  hydrOXYzine (ATARAX) 25 MG tablet Take 1 tablet (25 mg total) by mouth at bedtime as needed. 07/10/22   [name], MD  Iron, Ferrous Sulfate, 325 (65 Fe) MG TABS Take 1 tablet ( 325 mg) by mouth daily. 05/24/22   [name], MD  lipase/protease/amylase (CREON) 12000-38000 units CPEP capsule Take 1 capsule (12,000 Units total) by mouth 3 (three) times daily before meals. 04/18/22   Lyndle Herrlich, MD  Multiple Vitamin (MULTIVITAMIN WITH MINERALS) TABS tablet Take 1 tablet by mouth daily. 09/27/21   [name], MD  nicotine (NICODERM CQ - DOSED IN MG/24 HOURS) 14 mg/24hr patch Place 1 patch (14 mg total) onto the skin daily. 06/18/22   Leroy Sea, MD  ondansetron (ZOFRAN) 8 MG tablet Take 1 tablet (8 mg total) by mouth every 6 (six) hours as needed for nausea. 07/10/22  [name], MD  pantoprazole (PROTONIX) 40 MG tablet Take 1 tablet (40 mg total) by mouth 2 (two) times daily before a meal. 07/10/22   [name], MD  polyethylene glycol (MIRALAX / GLYCOLAX) 17 g packet Take 17 g by mouth daily as needed for mild constipation or moderate constipation. 10/07/21   Rocky Morel, DO  senna-docusate (SENOKOT-S) 8.6-50 MG tablet Take 1 tablet by mouth at bedtime as needed for mild constipation. 10/07/21   Rocky Morel, DO  sucralfate (CARAFATE) 1 g tablet Take 1 tablet (1 g total) by mouth 4 (four)  times daily -  with meals and at bedtime. 07/10/22   [name], MD  thiamine (VITAMIN B1) 100 MG tablet Take 1 tablet (100 mg total) by mouth daily. 06/18/22   Leroy Sea, MD    Past Medical History:  Diagnosis Date   Alcohol withdrawal syndrome with complication (HCC)    Alcoholism (HCC)    Elevated AST (SGOT)    Gastrointestinal hemorrhage    Homeless    Hypertension    Pancreatic insufficiency    takes Creon   Symptomatic anemia 11/23/2015   Thrombocytopenia (HCC) 05/09/2021   Past Surgical History:  Procedure Laterality Date   BIOPSY  06/23/2019   Procedure: BIOPSY;  Surgeon: Shellia Cleverly, DO;  Location: [facility] ENDOSCOPY;  Service: Gastroenterology;;   BIOPSY  12/12/2020   Procedure: BIOPSY;  Surgeon: Lynann Bologna, MD;  Location: [facility] ENDOSCOPY;  Service: Endoscopy;;   BIOPSY  08/04/2021   Procedure: BIOPSY;  Surgeon: Jenel Lucks, MD;  Location: [facility] ENDOSCOPY;  Service: Gastroenterology;;   ESOPHAGOGASTRODUODENOSCOPY N/A 08/04/2021   Procedure: ESOPHAGOGASTRODUODENOSCOPY (EGD);  Surgeon: Jenel Lucks, MD;  Location: [facility];  Service: Gastroenterology;  Laterality: N/A;   ESOPHAGOGASTRODUODENOSCOPY (EGD) WITH PROPOFOL N/A 06/23/2019   Procedure: ESOPHAGOGASTRODUODENOSCOPY (EGD) WITH PROPOFOL;  Surgeon: Shellia Cleverly, DO;  Location: [facility] ENDOSCOPY;  Service: Gastroenterology;  Laterality: N/A;   ESOPHAGOGASTRODUODENOSCOPY (EGD) WITH PROPOFOL N/A 12/12/2020   Procedure: ESOPHAGOGASTRODUODENOSCOPY (EGD) WITH PROPOFOL;  Surgeon: Lynann Bologna, MD;  Location: [facility] ENDOSCOPY;  Service: Endoscopy;  Laterality: N/A;   HOT HEMOSTASIS N/A 06/23/2019   Procedure: HOT HEMOSTASIS (ARGON PLASMA COAGULATION/BICAP);  Surgeon: Shellia Cleverly, DO;  Location: [facility] ENDOSCOPY;  Service: Gastroenterology;  Laterality: N/A;   Social History:  reports that he has been smoking cigarettes. He has a 15.00 pack-year smoking history. He has never used smokeless tobacco. He  reports current alcohol use of about 28.0 standard drinks of alcohol per week. He reports that he does not use drugs. No Known Allergies Family History  Problem Relation Age of Onset   Diabetes Mellitus II Neg Hx    Colon cancer Neg Hx    Stomach cancer Neg Hx    Pancreatic cancer Neg Hx    Physical Exam: Vitals:   07/11/22 0429 07/11/22 0431 07/11/22 0529 07/11/22 0823  BP: 110/62  134/73 (!) 141/80  Pulse: 97  87 91  Resp: 18  18 18   Temp:  98.9 F (37.2 C) 98.8 F (37.1 C) 97.8 F (36.6 C)  TempSrc:  Oral    SpO2: 99%  100% 95%  Weight:      Height:       General: Appear in mild distress; no visible Abnormal Neck Mass Or lumps, Conjunctiva normal Cardiovascular: S1 and S2 Present, no Murmur, Respiratory: good respiratory effort, Bilateral Air entry present and CTA, no Crackles, no wheezes Abdomen: Bowel Sound present, Non tender  Extremities: no  Pedal edema Neurology: alert and oriented to time, place, and person Gait not checked due to patient safety concerns   Data Reviewed: I have reviewed ED notes, Vitals, Lab results and outpatient records. Since last encounter, pertinent lab results CBC and BMP   . I have ordered test including CBC, CMP, lipase  .   Assessment and Plan Acute on chronic recurrent pancreatitis likely from alcohol. No gallstones seen on the CT scan. CT scan shows evidence of acute pancreatitis. Lipase level minimally elevated 116 trending up to 167. Abdominal pain still present. Currently bowel rest, n.p.o., IV fluid, IV pain medication IV nausea medication. Will monitor clinically.  Duodenitis GERD Questionable hematemesis Patient reports hematemesis.  H&H relatively stable. CT also shows evidence of duodenitis most likely secondary to pancreatitis. Monitor for improvement in advancing the diet. If persistent nausea and vomiting we will have to consult GI. On PPI twice daily at home as well as Carafate for some reason which I will  continue.  Alcohol abuse Drinks 1 pint of vodka on a daily basis. Last drink was on 5/15. On CIWA protocol right now. At high risk for withdrawal.  Possible UTI Urine shows nitrates. Patient without any symptoms. Monitor clinically.  Hypokalemia Hypomagnesemia. Replacement Monitor.  Chronic thrombocytopenia Holding DVT prophylaxis. SCDs. Secondary to alcohol abuse with Monitor   Hepatic steatosis Secondary to alcohol use. Monitor  Active smoker Nicotine patch. Smokes half a pack a day.  HTN Blood pressure stable. Continue midodrine.  Hold metoprolol.  Advance Care Planning:   Code Status: Full Code  Consults: None so far Family Communication: No one at bedside  Author: Lynden Oxford, MD 07/11/2022 8:49 AM For on call review [email or web address].

## 2022-07-11 NOTE — Consult Note (Addendum)
Consultation  Referring Provider:  [facility]  Primary Care Physician:  [name], MD Primary Gastroenterologist: Dr. Tomasa Rand       Reason for Consultation:     Acute on chronic pancreatitis  LOS: 0 days          HPI:   Samuel Blevins is a 57 y.o. male with past medical history significant for alcohol abuse, recurrent pancreatitis, pancytopenia, history of h pylori, presents for evaluation of abdominal pain, nausea, and vomiting.  Patient [facility] from [facility] with 1 day of upper abdominal pain with associated nausea and vomiting.  Patient states he had 1 episode of hematemesis prior to coming to ED (unsure whether it was red or black).  Has not had recurrent hematemesis since being admitted.  However, reports episode of bilious nonbloody emesis last night.  States he had 1 episode of dark stool yesterday.  Reports intermittent dark stools for a few months.  Denies NSAID use.  Reports half pack cigarettes per day for many years.  Reports 0.5 to 1 pint of liquor daily for the last 30 years.  Patient has had multiple ED visits and admissions this year related to alcohol abuse and pancreatitis.  Does have seizures associated with withdrawal from alcohol.  Patient also reports chronic bilateral upper extremity tremors.  Upon admission potassium 2.7, mild AST elevation.  Lipase 116.  CBC with leukopenia of 3.5 and thrombocytopenia at 117.  Hgb 11.2, stable. Last triglyceride result in June 2023 was 83.  CT abdomen pelvis with contrast showed inflammatory changes around the head and uncinate process of the pancreas and increased inflammatory changes around the descending duodenum with increased severe duodenal thickening.  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  Hepatic steatosis.  Diverticulosis.   PREVIOUS WORKUP------------------------------------------------  EGD 08/04/2021 with Dr. Tomasa Rand for hematemesis: Normal esophagus, 3 cm  hiatal hernia, normal stomach, single duodenal polyp (biopsied as reactive duodenal mucosa with extensive gastric metaplasia compatible with peptic duodenitis).  Stomach biopsy negative for dysplasia/H. pylori.  Showed mild chronic gastritis and focal intestinal metaplasia  EGD 12/12/2020 with Dr. Chales Abrahams for suspected UGIB: Mild gastritis, single duodenal erosion without bleeding. Negative h pylori  EGD 06/23/2019 with Dr. Barron Alvine for IDA, hematemesis, melena: Normal esophagus, gastritis, 6 angioectasias in duodenum treated with APC, mucosal changes in duodenum.  Biopsies showed mildly active H. pylori gastritis. Treated with quadruple therapy.  Past Medical History:  Diagnosis Date   Alcohol withdrawal syndrome with complication (HCC)    Alcoholism (HCC)    Elevated AST (SGOT)    Gastrointestinal hemorrhage    Homeless    Hypertension    Pancreatic insufficiency    takes Creon   Symptomatic anemia 11/23/2015   Thrombocytopenia (HCC) 05/09/2021    Surgical History:  He  has a past surgical history that includes Esophagogastroduodenoscopy (egd) with propofol (N/A, 06/23/2019); Hot hemostasis (N/A, 06/23/2019); biopsy (06/23/2019); Esophagogastroduodenoscopy (egd) with propofol (N/A, 12/12/2020); biopsy (12/12/2020); Esophagogastroduodenoscopy (N/A, 08/04/2021); and biopsy (08/04/2021). Family History:  His family history is not on file. Social History:   reports that he has been smoking cigarettes. He has a 15.00 pack-year smoking history. He has never used smokeless tobacco. He reports current alcohol use of about 28.0 standard drinks of alcohol per week. He reports that he does not use drugs.  Prior to Admission medications   Medication Sig Start Date End Date Taking? Authorizing Provider  acetaminophen (TYLENOL) 325 MG tablet Take 2 tablets (650 mg  total) by mouth every 6 (six) hours as needed for fever, moderate pain, mild pain or headache. Patient not taking: Reported on 07/11/2022  12/12/20   Belva Agee, MD  amLODipine (NORVASC) 5 MG tablet Take 1 tablet (5 mg total) by mouth daily. Patient not taking: Reported on 07/11/2022 07/10/22   [name], MD  carvedilol (COREG) 3.125 MG tablet Take 1 tablet (3.125 mg total) by mouth 2 (two) times daily with a meal. Patient not taking: Reported on 07/11/2022 07/10/22   [name], MD  clobetasol cream (TEMOVATE) 0.05 % Apply 1 Application topically 2 (two) times daily. Patient not taking: Reported on 07/11/2022 03/06/22   [name], MD  folic acid (FOLVITE) 1 MG tablet Take 1 tablet (1 mg total) by mouth daily. Patient not taking: Reported on 07/11/2022 07/10/22   [name], MD  hydrOXYzine (ATARAX) 25 MG tablet Take 1 tablet (25 mg total) by mouth at bedtime as needed. Patient not taking: Reported on 07/11/2022 07/10/22   [name], MD  Iron, Ferrous Sulfate, 325 (65 Fe) MG TABS Take 1 tablet ( 325 mg) by mouth daily. Patient not taking: Reported on 07/11/2022 05/24/22   [name], MD  lipase/protease/amylase (CREON) 12000-38000 units CPEP capsule Take 1 capsule (12,000 Units total) by mouth 3 (three) times daily before meals. Patient not taking: Reported on 07/11/2022 04/18/22   Lyndle Herrlich, MD  Multiple Vitamin (MULTIVITAMIN WITH MINERALS) TABS tablet Take 1 tablet by mouth daily. Patient not taking: Reported on 07/11/2022 09/27/21   [name], MD  nicotine (NICODERM CQ - DOSED IN MG/24 HOURS) 14 mg/24hr patch Place 1 patch (14 mg total) onto the skin daily. Patient not taking: Reported on 07/11/2022 06/18/22   Leroy Sea, MD  ondansetron (ZOFRAN) 8 MG tablet Take 1 tablet (8 mg total) by mouth every 6 (six) hours as needed for nausea. Patient not taking: Reported on 07/11/2022 07/10/22   [name], MD  pantoprazole (PROTONIX) 40 MG tablet Take 1 tablet (40 mg total) by mouth 2 (two) times daily before a meal. Patient not taking: Reported on 07/11/2022 07/10/22    [name], MD  polyethylene glycol (MIRALAX / GLYCOLAX) 17 g packet Take 17 g by mouth daily as needed for mild constipation or moderate constipation. Patient not taking: Reported on 07/11/2022 10/07/21   Rocky Morel, DO  senna-docusate (SENOKOT-S) 8.6-50 MG tablet Take 1 tablet by mouth at bedtime as needed for mild constipation. Patient not taking: Reported on 07/11/2022 10/07/21   Rocky Morel, DO  sucralfate (CARAFATE) 1 g tablet Take 1 tablet (1 g total) by mouth 4 (four) times daily -  with meals and at bedtime. Patient not taking: Reported on 07/11/2022 07/10/22   [name], MD  thiamine (VITAMIN B1) 100 MG tablet Take 1 tablet (100 mg total) by mouth daily. Patient not taking: Reported on 07/11/2022 06/18/22   Leroy Sea, MD    Current Facility-Administered Medications  Medication Dose Route Frequency Provider Last Rate Last Admin   amLODipine (NORVASC) tablet 5 mg  5 mg Oral Daily Rolly Salter, MD   5 mg at 07/11/22 1610   folic acid (FOLVITE) tablet 1 mg  1 mg Oral Daily Rolly Salter, MD   1 mg at 07/11/22 9604   HYDROmorphone (DILAUDID) injection 0.5 mg  0.5 mg Intravenous Q3H PRN Rolly Salter, MD       lactated ringers infusion   Intravenous Continuous  Rolly Salter, MD 150 mL/hr at 07/11/22 0933 New Bag at 07/11/22 0933   LORazepam (ATIVAN) tablet 1-4 mg  1-4 mg Oral Q1H PRN Rolly Salter, MD       Or   LORazepam (ATIVAN) injection 1-4 mg  1-4 mg Intravenous Q1H PRN Rolly Salter, MD       LORazepam (ATIVAN) tablet 0-4 mg  0-4 mg Oral Q6H Rolly Salter, MD       Followed by   Melene Muller ON 07/13/2022] LORazepam (ATIVAN) tablet 0-4 mg  0-4 mg Oral Q12H Rolly Salter, MD       multivitamin with minerals tablet 1 tablet  1 tablet Oral Daily Rolly Salter, MD   1 tablet at 07/11/22 1610   nicotine (NICODERM CQ - dosed in mg/24 hours) patch 14 mg  14 mg Transdermal Daily Rolly Salter, MD   14 mg at 07/11/22 0928   ondansetron (ZOFRAN)  injection 4 mg  4 mg Intravenous Q6H PRN Rolly Salter, MD       oxyCODONE (Oxy IR/ROXICODONE) immediate release tablet 5 mg  5 mg Oral Q3H PRN Rolly Salter, MD   5 mg at 07/11/22 0928   pantoprazole (PROTONIX) injection 40 mg  40 mg Intravenous Q12H Rolly Salter, MD   40 mg at 07/11/22 0927   sucralfate (CARAFATE) tablet 1 g  1 g Oral TID WC & HS Rolly Salter, MD       thiamine (VITAMIN B1) tablet 100 mg  100 mg Oral Daily Rolly Salter, MD   100 mg at 07/11/22 9604   Or   thiamine (VITAMIN B1) injection 100 mg  100 mg Intravenous Daily Rolly Salter, MD        Allergies as of 07/10/2022   (No Known Allergies)    Review of Systems  Constitutional:  Negative for chills, fever and weight loss.  HENT:  Negative for hearing loss and tinnitus.   Eyes:  Negative for blurred vision and double vision.  Respiratory:  Negative for cough and hemoptysis.   Cardiovascular:  Negative for chest pain and palpitations.  Gastrointestinal:  Positive for abdominal pain, melena, nausea and vomiting. Negative for blood in stool, constipation, diarrhea and heartburn.  Genitourinary:  Negative for dysuria and urgency.  Musculoskeletal:  Negative for myalgias and neck pain.  Skin:  Negative for itching and rash.  Neurological:  Positive for seizures (with alcohol withdrawal). Negative for loss of consciousness.  Psychiatric/Behavioral:  Negative for depression and suicidal ideas.        Physical Exam:  Vital signs in last 24 hours: Temp:  [97.7 F (36.5 C)-98.9 F (37.2 C)] 97.8 F (36.6 C) (05/16 0823) Pulse Rate:  [87-106] 91 (05/16 0823) Resp:  [16-18] 18 (05/16 0823) BP: (110-150)/(62-89) 141/80 (05/16 0823) SpO2:  [92 %-100 %] 95 % (05/16 0823) Weight:  [80 kg] 80 kg (05/15 1938) Last BM Date : 07/10/22 Last BM recorded by nurses in past 5 days No data recorded  Physical Exam Constitutional:      General: He is not in acute distress.    Appearance: He is ill-appearing.   HENT:     Head: Normocephalic and atraumatic.     Nose: Nose normal. No congestion.     Mouth/Throat:     Mouth: Mucous membranes are moist.     Pharynx: Oropharynx is clear.  Eyes:     Extraocular Movements: Extraocular movements intact.     Conjunctiva/sclera:  Conjunctivae normal.  Cardiovascular:     Rate and Rhythm: Normal rate and regular rhythm.  Pulmonary:     Effort: Pulmonary effort is normal.     Breath sounds: Normal breath sounds.  Abdominal:     General: Abdomen is flat. Bowel sounds are normal. There is no distension.     Palpations: Abdomen is soft. There is no mass.     Tenderness: There is abdominal tenderness (significant epigastric tenderness). There is no guarding or rebound.     Hernia: No hernia is present.  Musculoskeletal:        General: No swelling. Normal range of motion.     Cervical back: Normal range of motion and neck supple.  Skin:    General: Skin is warm and dry.     Coloration: Skin is not jaundiced.  Neurological:     General: No focal deficit present.     Mental Status: He is oriented to person, place, and time.  Psychiatric:        Behavior: Behavior normal.        Thought Content: Thought content normal.        Judgment: Judgment normal.      LAB RESULTS: Recent Labs    07/10/22 2015 07/11/22 0731  WBC 3.5* 4.4  HGB 11.2* 10.1*  HCT 34.7* 30.5*  PLT 117* 96*   BMET Recent Labs    07/10/22 2015 07/11/22 0731  NA 138 134*  K 2.7* 3.4*  CL 99 99  CO2 24 24  GLUCOSE 82 76  BUN <5* <5*  CREATININE 0.61 0.63  CALCIUM 9.0 8.5*   LFT Recent Labs    07/11/22 0731  PROT 7.6  ALBUMIN 3.2*  AST 69*  ALT 25  ALKPHOS 83  BILITOT 1.2   PT/INR No results for input(s): "LABPROT", "INR" in the last 72 hours.  STUDIES: DG Chest 2 View  Result Date: 07/11/2022 CLINICAL DATA:  Cough EXAM: CHEST - 2 VIEW COMPARISON:  06/16/2022 FINDINGS: The lungs are symmetrically well expanded. Previously noted right basilar  consolidation and pleural effusion have resolved. The lungs are clear save for minimal left basilar atelectasis. No pneumothorax. Cardiac size within normal limits. Pulmonary vascularity is normal. IMPRESSION: 1. No active cardiopulmonary disease. Minimal left basilar atelectasis. Electronically Signed   By: Helyn Numbers M.D.   On: 07/11/2022 00:59   CT ABDOMEN PELVIS W CONTRAST  Result Date: 07/11/2022 CLINICAL DATA:  Abdominal pain and hematemesis.  Subjective fever. EXAM: CT ABDOMEN AND PELVIS WITH CONTRAST TECHNIQUE: Multidetector CT imaging of the abdomen and pelvis was performed using the standard protocol following bolus administration of intravenous contrast. RADIATION DOSE REDUCTION: This exam was performed according to the departmental dose-optimization program which includes automated exposure control, adjustment of the mA and/or kV according to patient size and/or use of iterative reconstruction technique. CONTRAST:  75mL OMNIPAQUE IOHEXOL 350 MG/ML SOLN COMPARISON:  CT abdomen and pelvis with IV contrast 06/14/2022, CTA chest, abdomen and pelvis 06/10/2022, CT abdomen pelvis with IV contrast 06/07/2022. FINDINGS: Lower chest: Prior bilateral basal pulmonary infiltrates have resolved since last month. There is mild lower bronchial thickening without bronchial plugging. Scattered linear atelectasis or scarring without acute process. The cardiac size is normal. Hepatobiliary: Diffusely steatotic liver measuring 19 cm length. No mass enhancement. Unremarkable gallbladder and bile ducts. Pancreas: Interval increased inflammatory stranding around the head and uncinate process of the pancreas, with trace fluid and additional inflammatory change in the pancreaticoduodenal groove. There are also significant inflammatory changes  around the adjacent descending duodenum. Findings could all be due to pancreatitis, or could be due to a combination of duodenitis with or without duodenal ulcerative disease, and  pancreatitis. Laboratory and clinical correlation advised. There are parenchymal calcifications again in the pancreatic head compatible with chronic calcific pancreatitis. The rest of the pancreas is unremarkable and there is no ductal dilatation or localizing fluid collection. There is no focal pancreatic hypoenhancement to suspect pancreatic necrosis. Spleen: Normal. Adrenals/Urinary Tract: No adrenal hemorrhage or renal injury identified. There is mild bladder thickening versus underdistention. There is an impression into the inferior bladder by the prostate. Stomach/Bowel: There are chronic thickened folds in the stomach. There is interval increased severe fold thickening in the descending duodenum, which could be due to severe duodenitis, duodenal ulcerative disease, or infiltrating disease. As above there are increased moderate inflammatory changes around the descending duodenum. The horizontal segment is only mildly thickened. The rest of the small bowel is unremarkable. An appendix is not seen in this patient. The large bowel unremarkable apart from diverticulosis. Vascular/Lymphatic: The hepatic portal vein, splenic vein and SMV are clear. There is aortoiliac atherosclerotic calcific plaque. There are borderline prominent reactive periportal lymph nodes up to 9 mm in short axis. No other adenopathy is seen. Reproductive: The prostate is not enlarged. Other: Scattered pelvic phleboliths. Prior umbilical hernia repair with small supraumbilical midline anterior wall fat hernia. There is scattered peripancreatic nonlocalizing fluid but no focal fluid collection. There is no free air, abscess, free hemorrhage or incarcerated hernia. Musculoskeletal: Advanced degenerative disc disease L4-5 and L5-S1 with facet DJD. Bilateral SI joint bridging osteophytes. No acute or other significant osseous findings. IMPRESSION: 1. Interval increased inflammatory changes around the head and uncinate process of the pancreas,  with increased inflammatory changes around the descending duodenum and increased severe duodenal thickening. Findings could be all due to acute pancreatitis, duodenitis with or without duodenal ulcerative disease, or combination. 2. 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 3. Hepatic steatosis. 4. Aortic atherosclerosis. 5. Cystitis versus bladder nondistention. 6. Diverticulosis. 7. Umbilical hernia repair with small supraumbilical midline anterior wall fat hernia. 8. Interval resolution of prior bilateral basal pulmonary infiltrates since the last CT. Aortic Atherosclerosis (ICD10-I70.0). Electronically Signed   By: Almira Bar M.D.   On: 07/11/2022 00:34      Impression/Plan   Acute on chronic pancreatitis; likely secondary to alcohol use Lipase 167 No leukocytosis Ethanol 88 AST 69/ALT 25/alk phos 83 CT abdomen pelvis with contrast shows pancreatitis and inflammatory changes around the descending duodenum duodenal thickening.  Chronic pancreatitis.  Hepatic steatosis.  No gallstones -Obtain triglycerides -Smoking and alcohol cessation counseling. -Trend CBC, CMP, lipase. -Fluid replacement lactated ringers 125-150cc/hr (3 cc/kg/hr) can decrease after 12-24 hours to 100-125cc/hr. -Pain control per the inpatient medical team. -Encourage early ambulation -Advance diet as tolerated.   Melena/ hematemesis (?) hgb 10.1, stable BUN <5, 0.63 Low suspicion for UGIB with stable hgb, no further bleeding, and no elevation in BUN, though cannot rule out - continue to monitor. If significant drop in hgb or overt bleeding will consider EGD - protonix 40mg  IV BID  Thrombocytopenia -Platelets 96  Alcohol abuse History of withdrawal - CIWA protocol - MVA, thiamine, folic acid  Samuel Blevins  07/11/2022, 10:50 AM   I have taken an interval history, thoroughly reviewed the chart and examined the patient. I agree with the Advanced Practitioner's note, impression and  recommendations, and have recorded additional findings, impressions and recommendations below. I performed a  substantive portion of this encounter (>50% time spent), including a complete performance of the medical decision making.  My additional thoughts are as follows:  Recurrent alcohol-related pancreatitis causing vomiting and small-volume hematemesis.  Marked inflammatory changes of the duodenum that are likely reactive to the pancreatitis, though possibly erosive or ulcerative duodenitis.  Multiple prior upper endoscopies in same setting have not discovered lesions requiring endoscopic hemostasis.  When I evaluated him, he was having a significant amount of pain, has epigastric tenderness without distention and with preservation of bowel sounds.  He was also vomiting bilious nonbloody material.  Thrombocytopenia related to acute illness and probable marrow suppression from alcohol abuse.  He needs ongoing supportive care of his pancreatitis with judicious pain control, IV fluids, bowel rest and close monitoring for the possibility of alcohol withdrawal.  No current plans for upper endoscopy, that may change depending on his clinical condition during this hospitalization.   Samuel Blevins [phone]

## 2022-07-11 NOTE — ED Provider Notes (Addendum)
[place] [facility] DEPARTMENT AT [facility] Provider Note   CSN: [id number] Arrival date & time: 07/10/22  1940     History  Chief Complaint  Patient presents with   Abdominal Pain    Samuel Blevins is a 57 y.o. male.  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  He reports blood in vomit though will not elaborate whether it is dark or bright red.  He was at [facility] and was noted to have a fever, was given Tylenol by the physician there and was sent to the emergency department.  He has acutely vomiting on arrival.  Chart review, he was recently discharged from the hospital 4/23 with aspiration pneumonia and acute on chronic pancreatitis.  He reports he is still drinking, reports drinking vodka daily.    Home Medications Prior to Admission medications   Medication Sig Start Date End Date Taking? Authorizing Provider  acetaminophen (TYLENOL) 325 MG tablet Take 2 tablets (650 mg total) by mouth every 6 (six) hours as needed for fever, moderate pain, mild pain or headache. 12/12/20   Katsadouros, Vasilios, MD  amLODipine (NORVASC) 5 MG tablet Take 1 tablet (5 mg total) by mouth daily. 07/10/22   [name], MD  carvedilol (COREG) 3.125 MG tablet Take 1 tablet (3.125 mg total) by mouth 2 (two) times daily with a meal. 07/10/22   [name], MD  clobetasol cream (TEMOVATE) 0.05 % Apply 1 Application topically 2 (two) times daily. 03/06/22   [name], MD  folic acid (FOLVITE) 1 MG tablet Take 1 tablet (1 mg total) by mouth daily. 07/10/22   [name], MD  hydrOXYzine (ATARAX) 25 MG tablet Take 1 tablet (25 mg total) by mouth at bedtime as needed. 07/10/22   [name], MD  Iron, Ferrous Sulfate, 325 (65 Fe)  MG TABS Take 1 tablet ( 325 mg) by mouth daily. 05/24/22   [name], MD  lipase/protease/amylase (CREON) 12000-38000 units CPEP capsule Take 1 capsule (12,000 Units total) by mouth 3 (three) times daily before meals. 04/18/22   Lyndle Herrlich, MD  Multiple Vitamin (MULTIVITAMIN WITH MINERALS) TABS tablet Take 1 tablet by mouth daily. 09/27/21   [name], MD  nicotine (NICODERM CQ - DOSED IN MG/24 HOURS) 14 mg/24hr patch Place 1 patch (14 mg total) onto the skin daily. 06/18/22   Leroy Sea, MD  ondansetron (ZOFRAN) 8 MG tablet Take 1 tablet (8 mg total) by mouth every 6 (six) hours as needed for nausea. 07/10/22   [name], MD  pantoprazole (PROTONIX) 40 MG tablet Take 1 tablet (40 mg total) by mouth 2 (two) times daily before a meal. 07/10/22   [name], MD  polyethylene glycol (MIRALAX / GLYCOLAX) 17 g packet Take 17 g by mouth daily as needed for mild constipation or moderate constipation. 10/07/21   Rocky Morel, DO  senna-docusate (SENOKOT-S) 8.6-50 MG tablet Take 1 tablet by mouth at bedtime as needed for mild constipation. 10/07/21   Rocky Morel, DO  sucralfate (CARAFATE) 1 g tablet Take 1 tablet (1 g total) by mouth 4 (four) times daily -  with meals and at bedtime. 07/10/22   [name], MD  thiamine (VITAMIN B1) 100 MG tablet Take 1 tablet (100 mg total) by  mouth daily. 06/18/22   Leroy Sea, MD      Allergies    Patient has no known allergies.    Review of Systems   Review of Systems Ten systems reviewed and are negative for acute change, except as noted in the HPI.   Physical Exam Updated Vital Signs BP (!) 148/89 (BP Location: Right Arm)   Pulse 88   Temp 97.7 F (36.5 C) (Oral)   Ht 6\' 1"  (1.854 m)   Wt 80 kg   SpO2 98%   BMI 23.27 kg/m  Physical Exam Constitutional:      Appearance: Normal appearance. He is ill-appearing.     Comments: Diaphoretic   HENT:     Head: Normocephalic and atraumatic.  Eyes:      Extraocular Movements: Extraocular movements intact.     Conjunctiva/sclera: Conjunctivae normal.     Pupils: Pupils are equal, round, and reactive to light.  Cardiovascular:     Rate and Rhythm: Normal rate and regular rhythm.     Pulses: Normal pulses.     Heart sounds: Normal heart sounds.  Pulmonary:     Effort: Pulmonary effort is normal.     Breath sounds: Normal breath sounds.  Abdominal:     General: Abdomen is flat. Bowel sounds are normal. There is no distension.     Palpations: Abdomen is soft. There is no mass.     Tenderness: There is abdominal tenderness in the epigastric area. There is no right CVA tenderness, left CVA tenderness, guarding or rebound.  Musculoskeletal:        General: Normal range of motion.     Cervical back: Normal range of motion. No tenderness.  Skin:    General: Skin is warm and dry.  Neurological:     General: No focal deficit present.     Mental Status: He is alert and oriented to person, place, and time.  Psychiatric:        Mood and Affect: Mood normal.        Behavior: Behavior normal.     ED Results / Procedures / Treatments   Labs (all labs ordered are listed, but only abnormal results are displayed) Labs Reviewed  LIPASE, BLOOD - Abnormal; Notable for the following components:      Result Value   Lipase 116 (*)    All other components within normal limits  COMPREHENSIVE METABOLIC PANEL - Abnormal; Notable for the following components:   Potassium 2.7 (*)    BUN <5 (*)    Total Protein 9.3 (*)    AST 75 (*)    All other components within normal limits  CBC - Abnormal; Notable for the following components:   WBC 3.5 (*)    RBC 3.94 (*)    Hemoglobin 11.2 (*)    HCT 34.7 (*)    RDW 17.7 (*)    Platelets 117 (*)    All other components within normal limits  MAGNESIUM - Abnormal; Notable for the following components:   Magnesium 1.3 (*)    All other components within normal limits  URINALYSIS, ROUTINE W REFLEX MICROSCOPIC   LACTIC ACID, PLASMA  LACTIC ACID, PLASMA  ETHANOL    EKG None  Radiology DG Chest 2 View  Result Date: 07/11/2022 CLINICAL DATA:  Cough EXAM: CHEST - 2 VIEW COMPARISON:  06/16/2022 FINDINGS: The lungs are symmetrically well expanded. Previously noted right basilar consolidation and pleural effusion have resolved. The lungs are clear save for minimal left basilar  atelectasis. No pneumothorax. Cardiac size within normal limits. Pulmonary vascularity is normal. IMPRESSION: 1. No active cardiopulmonary disease. Minimal left basilar atelectasis. Electronically Signed   By: Helyn Numbers M.D.   On: 07/11/2022 00:59   CT ABDOMEN PELVIS W CONTRAST  Result Date: 07/11/2022 CLINICAL DATA:  Abdominal pain and hematemesis.  Subjective fever. EXAM: CT ABDOMEN AND PELVIS WITH CONTRAST TECHNIQUE: Multidetector CT imaging of the abdomen and pelvis was performed using the standard protocol following bolus administration of intravenous contrast. RADIATION DOSE REDUCTION: This exam was performed according to the departmental dose-optimization program which includes automated exposure control, adjustment of the mA and/or kV according to patient size and/or use of iterative reconstruction technique. CONTRAST:  75mL OMNIPAQUE IOHEXOL 350 MG/ML SOLN COMPARISON:  CT abdomen and pelvis with IV contrast 06/14/2022, CTA chest, abdomen and pelvis 06/10/2022, CT abdomen pelvis with IV contrast 06/07/2022. FINDINGS: Lower chest: Prior bilateral basal pulmonary infiltrates have resolved since last month. There is mild lower bronchial thickening without bronchial plugging. Scattered linear atelectasis or scarring without acute process. The cardiac size is normal. Hepatobiliary: Diffusely steatotic liver measuring 19 cm length. No mass enhancement. Unremarkable gallbladder and bile ducts. Pancreas: Interval increased inflammatory stranding around the head and uncinate process of the pancreas, with trace fluid and additional  inflammatory change in the pancreaticoduodenal groove. There are also significant inflammatory changes around the adjacent descending duodenum. Findings could all be due to pancreatitis, or could be due to a combination of duodenitis with or without duodenal ulcerative disease, and pancreatitis. Laboratory and clinical correlation advised. There are parenchymal calcifications again in the pancreatic head compatible with chronic calcific pancreatitis. The rest of the pancreas is unremarkable and there is no ductal dilatation or localizing fluid collection. There is no focal pancreatic hypoenhancement to suspect pancreatic necrosis. Spleen: Normal. Adrenals/Urinary Tract: No adrenal hemorrhage or renal injury identified. There is mild bladder thickening versus underdistention. There is an impression into the inferior bladder by the prostate. Stomach/Bowel: There are chronic thickened folds in the stomach. There is interval increased severe fold thickening in the descending duodenum, which could be due to severe duodenitis, duodenal ulcerative disease, or infiltrating disease. As above there are increased moderate inflammatory changes around the descending duodenum. The horizontal segment is only mildly thickened. The rest of the small bowel is unremarkable. An appendix is not seen in this patient. The large bowel unremarkable apart from diverticulosis. Vascular/Lymphatic: The hepatic portal vein, splenic vein and SMV are clear. There is aortoiliac atherosclerotic calcific plaque. There are borderline prominent reactive periportal lymph nodes up to 9 mm in short axis. No other adenopathy is seen. Reproductive: The prostate is not enlarged. Other: Scattered pelvic phleboliths. Prior umbilical hernia repair with small supraumbilical midline anterior wall fat hernia. There is scattered peripancreatic nonlocalizing fluid but no focal fluid collection. There is no free air, abscess, free hemorrhage or incarcerated hernia.  Musculoskeletal: Advanced degenerative disc disease L4-5 and L5-S1 with facet DJD. Bilateral SI joint bridging osteophytes. No acute or other significant osseous findings. IMPRESSION: 1. Interval increased inflammatory changes around the head and uncinate process of the pancreas, with increased inflammatory changes around the descending duodenum and increased severe duodenal thickening. Findings could be all due to acute pancreatitis, duodenitis with or without duodenal ulcerative disease, or combination. 2. 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 3. Hepatic steatosis. 4. Aortic atherosclerosis. 5. Cystitis versus bladder nondistention. 6. Diverticulosis. 7. Umbilical hernia repair with small supraumbilical midline anterior wall fat hernia. 8. Interval resolution of prior  bilateral basal pulmonary infiltrates since the last CT. Aortic Atherosclerosis (ICD10-I70.0). Electronically Signed   By: Almira Bar M.D.   On: 07/11/2022 00:34    Procedures Procedures    Medications Ordered in ED Medications  potassium chloride 10 mEq in 100 mL IVPB (10 mEq Intravenous New Bag/Given 07/11/22 0106)  magnesium sulfate IVPB 2 g 50 mL (has no administration in time range)  HYDROmorphone (DILAUDID) injection 1 mg (has no administration in time range)  potassium chloride SA (KLOR-CON M) CR tablet 40 mEq (40 mEq Oral Given 07/11/22 0105)  lactated ringers bolus 1,000 mL (1,000 mLs Intravenous New Bag/Given 07/11/22 0105)  iohexol (OMNIPAQUE) 350 MG/ML injection 75 mL (75 mLs Intravenous Contrast Given 07/11/22 0002)  ondansetron (ZOFRAN) injection 4 mg (4 mg Intravenous Given 07/11/22 0105)    ED Course/ Medical Decision Making/ A&P                             Medical Decision Making Amount and/or Complexity of Data Reviewed Labs: ordered. Radiology: ordered.  Risk Prescription drug management. Decision regarding hospitalization.   57 year old gentleman with a history of alcohol dependence  with severe withdrawal, alcohol induced pancreatitis, homelessness, AVM of the small bowel, hypertension, pancreatic insufficiency, GI bleed presents to the ER with complaints of 2 days of abdominal pain.  [facility] where he was having nausea/vomiting and generalized abdominal pain.  He was noted to have a fever and was given Tylenol.  He reports continuing to drink alcohol.  On arrival, he is ill-appearing.  He has generalized abdominal tenderness on exam.  Ddx includes alcohol induced pancreatitis, diverticulitis, appendicitis, gastric reflux, cholecystitis, GI bleed, viral gastritis  Labs ordered, reviewed, potassium of 2.7, BUN less than 5, mild AST elevation, lipase is 116, magnesium of 1.3, CBC with leukopenia of 3.5 and thrombocytopenia 117 which is new.  Hemoglobin of 11.2 which appears stable.  He had a CT scan of the abdomen performed chart reviewed, agree with radiology read, which showed interval increased inflammatory changes.    Patient was given Zofran, IV fluids, pain medication, potassium and magnesium were repleted.  He is persistently still vomiting and complaining of pain, which I think warrants.  Consulted hospitalist for admission.  Spoke w/ Dr. Margo Aye who will admit the patient for further evaluation and treatment  2:10 AM: Pt noted to have hematemsis. Hgb stable and hemodynamically stable. Reached out to [facility] Dr. Barron Alvine for non-emergent consult in the AM.  Final Clinical Impression(s) / ED Diagnoses Final diagnoses:  Alcohol-induced acute pancreatitis, unspecified complication status  Hypokalemia  Hypomagnesemia    Rx / DC Orders ED Discharge Orders     None            Leone Brand 07/11/22 0211    Glendora Score, MD 07/11/22 1701

## 2022-07-12 DIAGNOSIS — R933 Abnormal findings on diagnostic imaging of other parts of digestive tract: Secondary | ICD-10-CM | POA: Diagnosis not present

## 2022-07-12 DIAGNOSIS — R1013 Epigastric pain: Secondary | ICD-10-CM

## 2022-07-12 DIAGNOSIS — K920 Hematemesis: Secondary | ICD-10-CM | POA: Diagnosis not present

## 2022-07-12 DIAGNOSIS — K921 Melena: Secondary | ICD-10-CM | POA: Diagnosis not present

## 2022-07-12 DIAGNOSIS — K852 Alcohol induced acute pancreatitis without necrosis or infection: Secondary | ICD-10-CM | POA: Diagnosis not present

## 2022-07-12 DIAGNOSIS — K861 Other chronic pancreatitis: Secondary | ICD-10-CM | POA: Diagnosis not present

## 2022-07-12 LAB — CBC WITH DIFFERENTIAL/PLATELET
Abs Immature Granulocytes: 0.02 10*3/uL (ref 0.00–0.07)
Basophils Absolute: 0 10*3/uL (ref 0.0–0.1)
Basophils Relative: 1 %
Eosinophils Absolute: 0.1 10*3/uL (ref 0.0–0.5)
Eosinophils Relative: 1 %
HCT: 34.1 % — ABNORMAL LOW (ref 39.0–52.0)
Hemoglobin: 11.2 g/dL — ABNORMAL LOW (ref 13.0–17.0)
Immature Granulocytes: 1 %
Lymphocytes Relative: 19 %
Lymphs Abs: 0.8 10*3/uL (ref 0.7–4.0)
MCH: 28.6 pg (ref 26.0–34.0)
MCHC: 32.8 g/dL (ref 30.0–36.0)
MCV: 87 fL (ref 80.0–100.0)
Monocytes Absolute: 0.6 10*3/uL (ref 0.1–1.0)
Monocytes Relative: 13 %
Neutro Abs: 2.9 10*3/uL (ref 1.7–7.7)
Neutrophils Relative %: 65 %
Platelets: 75 10*3/uL — ABNORMAL LOW (ref 150–400)
RBC: 3.92 MIL/uL — ABNORMAL LOW (ref 4.22–5.81)
RDW: 17.6 % — ABNORMAL HIGH (ref 11.5–15.5)
WBC: 4.4 10*3/uL (ref 4.0–10.5)
nRBC: 0 % (ref 0.0–0.2)

## 2022-07-12 LAB — COMPREHENSIVE METABOLIC PANEL
ALT: 22 U/L (ref 0–44)
AST: 46 U/L — ABNORMAL HIGH (ref 15–41)
Albumin: 3.3 g/dL — ABNORMAL LOW (ref 3.5–5.0)
Alkaline Phosphatase: 89 U/L (ref 38–126)
Anion gap: 15 (ref 5–15)
BUN: <5 mg/dL — ABNORMAL LOW (ref 6–20)
CO2: 21 mmol/L — ABNORMAL LOW (ref 22–32)
Calcium: 9.1 mg/dL (ref 8.9–10.3)
Chloride: 92 mmol/L — ABNORMAL LOW (ref 98–111)
Creatinine, Ser: 0.65 mg/dL (ref 0.61–1.24)
GFR, Estimated: >60 mL/min (ref >60)
Glucose, Bld: 78 mg/dL (ref 70–99)
Potassium: 3.3 mmol/L — ABNORMAL LOW (ref 3.5–5.1)
Sodium: 128 mmol/L — ABNORMAL LOW (ref 135–145)
Total Bilirubin: 1.9 mg/dL — ABNORMAL HIGH (ref 0.3–1.2)
Total Protein: 7.9 g/dL (ref 6.5–8.1)

## 2022-07-12 LAB — LIPASE, BLOOD: Lipase: 144 U/L — ABNORMAL HIGH (ref 11–51)

## 2022-07-12 LAB — MAGNESIUM
Magnesium: 1.1 mg/dL — ABNORMAL LOW (ref 1.7–2.4)
Magnesium: 1.7 mg/dL (ref 1.7–2.4)

## 2022-07-12 LAB — TRIGLYCERIDES: Triglycerides: 97 mg/dL (ref <150)

## 2022-07-12 MED ORDER — POTASSIUM CHLORIDE CRYS ER 20 MEQ PO TBCR
40.0000 meq | EXTENDED_RELEASE_TABLET | ORAL | Status: AC
  Administered 2022-07-12 (×2): 40 meq via ORAL
  Filled 2022-07-12 (×2): qty 2

## 2022-07-12 MED ORDER — MAGNESIUM SULFATE 4 GM/100ML IV SOLN
4.0000 g | Freq: Once | INTRAVENOUS | Status: AC
  Administered 2022-07-12: 4 g via INTRAVENOUS
  Filled 2022-07-12: qty 100

## 2022-07-12 NOTE — Progress Notes (Signed)
Patient requested briefs to wear messaged DR. about it and gave green light for patient to have briefs.

## 2022-07-12 NOTE — Progress Notes (Addendum)
Progress Note  Primary GI: Dr. Tomasa Rand  LOS: 1 day   Chief Complaint: Acute on chronic pancreatitis   Subjective   Patient states his pain has improved.  Not ready to eat yet, feels like he would like to try something maybe later this afternoon.  Denies nausea/vomiting.  Reports normal bowel movements.  Patient would like to go home today if possible  No family was present at the time of my evaluation.    Objective   Vital signs in last 24 hours: Temp:  [98 F (36.7 C)-98.4 F (36.9 C)] 98.4 F (36.9 C) (05/17 0827) Pulse Rate:  [90-103] 103 (05/17 0827) Resp:  [14-18] 17 (05/17 0827) BP: (144-170)/(87-94) 148/87 (05/17 0827) SpO2:  [71 %-98 %] 71 % (05/17 0827) Last BM Date : 07/12/22 Last BM recorded by nurses in past 5 days No data recorded  General:   male in no acute distress  Heart:  Regular rate and rhythm; no murmurs Pulm: Clear anteriorly; no wheezing Abdomen: soft, nondistended, normal bowel sounds in all quadrants. Nontender without guarding. No organomegaly appreciated. Extremities:  No edema Neurologic:  Alert and  oriented x4;  No focal deficits.  Psych:  Cooperative. Normal mood and affect.  Intake/Output from previous day: 05/16 0701 - 05/17 0700 In: 2341.7 [I.V.:2341.7] Out: 700 [Urine:700] Intake/Output this shift: Total I/O In: -  Out: 901 [Urine:900; Stool:1]  Studies/Results: DG Chest 2 View  Result Date: 07/11/2022 CLINICAL DATA:  Cough EXAM: CHEST - 2 VIEW COMPARISON:  06/16/2022 FINDINGS: The lungs are symmetrically well expanded. Previously noted right basilar consolidation and pleural effusion have resolved. The lungs are clear save for minimal left basilar atelectasis. No pneumothorax. Cardiac size within normal limits. Pulmonary vascularity is normal. IMPRESSION: 1. No active cardiopulmonary disease. Minimal left basilar atelectasis. Electronically Signed   By: Helyn Numbers M.D.   On: 07/11/2022 00:59   CT ABDOMEN PELVIS W  CONTRAST  Result Date: 07/11/2022 CLINICAL DATA:  Abdominal pain and hematemesis.  Subjective fever. EXAM: CT ABDOMEN AND PELVIS WITH CONTRAST TECHNIQUE: Multidetector CT imaging of the abdomen and pelvis was performed using the standard protocol following bolus administration of intravenous contrast. RADIATION DOSE REDUCTION: This exam was performed according to the departmental dose-optimization program which includes automated exposure control, adjustment of the mA and/or kV according to patient size and/or use of iterative reconstruction technique. CONTRAST:  75mL OMNIPAQUE IOHEXOL 350 MG/ML SOLN COMPARISON:  CT abdomen and pelvis with IV contrast 06/14/2022, CTA chest, abdomen and pelvis 06/10/2022, CT abdomen pelvis with IV contrast 06/07/2022. FINDINGS: Lower chest: Prior bilateral basal pulmonary infiltrates have resolved since last month. There is mild lower bronchial thickening without bronchial plugging. Scattered linear atelectasis or scarring without acute process. The cardiac size is normal. Hepatobiliary: Diffusely steatotic liver measuring 19 cm length. No mass enhancement. Unremarkable gallbladder and bile ducts. Pancreas: Interval increased inflammatory stranding around the head and uncinate process of the pancreas, with trace fluid and additional inflammatory change in the pancreaticoduodenal groove. There are also significant inflammatory changes around the adjacent descending duodenum. Findings could all be due to pancreatitis, or could be due to a combination of duodenitis with or without duodenal ulcerative disease, and pancreatitis. Laboratory and clinical correlation advised. There are parenchymal calcifications again in the pancreatic head compatible with chronic calcific pancreatitis. The rest of the pancreas is unremarkable and there is no ductal dilatation or localizing fluid collection. There is no focal pancreatic hypoenhancement to suspect pancreatic necrosis. Spleen: Normal.  Adrenals/Urinary Tract:  No adrenal hemorrhage or renal injury identified. There is mild bladder thickening versus underdistention. There is an impression into the inferior bladder by the prostate. Stomach/Bowel: There are chronic thickened folds in the stomach. There is interval increased severe fold thickening in the descending duodenum, which could be due to severe duodenitis, duodenal ulcerative disease, or infiltrating disease. As above there are increased moderate inflammatory changes around the descending duodenum. The horizontal segment is only mildly thickened. The rest of the small bowel is unremarkable. An appendix is not seen in this patient. The large bowel unremarkable apart from diverticulosis. Vascular/Lymphatic: The hepatic portal vein, splenic vein and SMV are clear. There is aortoiliac atherosclerotic calcific plaque. There are borderline prominent reactive periportal lymph nodes up to 9 mm in short axis. No other adenopathy is seen. Reproductive: The prostate is not enlarged. Other: Scattered pelvic phleboliths. Prior umbilical hernia repair with small supraumbilical midline anterior wall fat hernia. There is scattered peripancreatic nonlocalizing fluid but no focal fluid collection. There is no free air, abscess, free hemorrhage or incarcerated hernia. Musculoskeletal: Advanced degenerative disc disease L4-5 and L5-S1 with facet DJD. Bilateral SI joint bridging osteophytes. No acute or other significant osseous findings. IMPRESSION: 1. Interval increased inflammatory changes around the head and uncinate process of the pancreas, with increased inflammatory changes around the descending duodenum and increased severe duodenal thickening. Findings could be all due to acute pancreatitis, duodenitis with or without duodenal ulcerative disease, or combination. 2. 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 3. Hepatic steatosis. 4. Aortic atherosclerosis. 5. Cystitis versus bladder  nondistention. 6. Diverticulosis. 7. Umbilical hernia repair with small supraumbilical midline anterior wall fat hernia. 8. Interval resolution of prior bilateral basal pulmonary infiltrates since the last CT. Aortic Atherosclerosis (ICD10-I70.0). Electronically Signed   By: Almira Bar M.D.   On: 07/11/2022 00:34    Lab Results: Recent Labs    07/10/22 2015 07/11/22 0731 07/12/22 0853  WBC 3.5* 4.4 4.4  HGB 11.2* 10.1* 11.2*  HCT 34.7* 30.5* 34.1*  PLT 117* 96* 75*   BMET Recent Labs    07/10/22 2015 07/11/22 0731 07/12/22 0853  NA 138 134* 128*  K 2.7* 3.4* 3.3*  CL 99 99 92*  CO2 24 24 21*  GLUCOSE 82 76 78  BUN <5* <5* <5*  CREATININE 0.61 0.63 0.65  CALCIUM 9.0 8.5* 9.1   LFT Recent Labs    07/12/22 0853  PROT 7.9  ALBUMIN 3.3*  AST 46*  ALT 22  ALKPHOS 89  BILITOT 1.9*   PT/INR No results for input(s): "LABPROT", "INR" in the last 72 hours.   Scheduled Meds:  amLODipine  5 mg Oral Daily   folic acid  1 mg Oral Daily   LORazepam  0-4 mg Oral Q6H   Followed by   Melene Muller ON 07/13/2022] LORazepam  0-4 mg Oral Q12H   multivitamin with minerals  1 tablet Oral Daily   nicotine  14 mg Transdermal Daily   pantoprazole (PROTONIX) IV  40 mg Intravenous Q12H   sucralfate  1 g Oral TID WC & HS   thiamine  100 mg Oral Daily   Or   thiamine  100 mg Intravenous Daily   Continuous Infusions:  lactated ringers 150 mL/hr at 07/12/22 0916     Impression/plan:   Acute on chronic pancreatitis; likely secondary to alcohol use Lipase 167 No leukocytosis Ethanol 88 AST 46/ALT 22/alk phos 89 T. bili 1.9 Triglycerides 97 CT abdomen pelvis with contrast shows pancreatitis and inflammatory changes  around the descending duodenum duodenal thickening.  Chronic pancreatitis.  Hepatic steatosis.  No gallstones -Smoking and alcohol cessation counseling. -Trend CBC, CMP, lipase. -Can consider decreasing fluid replacement lactated ringers to 125 cc/hour -Pain control per  the inpatient medical team. -Encourage early ambulation -Advance diet as tolerated (recommend starting with full liquids when he is ready)   Melena/ hematemesis (?) hgb 11.2, improved BUN <5, 0.65 Low suspicion for UGIB with stable hgb, no further bleeding, and no elevation in BUN with multiple previous EGDs in similar setting that have been unrevealing. Suspect inflammatory changes of duodenum seen on CT are reactive to pancreatitis. - continue to monitor. If significant drop in hgb or overt bleeding will consider EGD.  However no role for EGD at this time - protonix 40mg  IV BID   Thrombocytopenia -Platelets 75  Alcohol abuse History of withdrawal - CIWA protocol - MVA, thiamine, folic acid  Bayley M McMichael  07/12/2022, 10:37 AM   I have taken an interval history, thoroughly reviewed the chart and examined the patient. I agree with the Advanced Practitioner's note, impression and recommendations, and have recorded additional findings, impressions and recommendations below. I performed a substantive portion of this encounter (>50% time spent), including a complete performance of the medical decision making.  My additional thoughts are as follows:   Upper abdominal pain reportedly improved from yesterday.  No further hematemesis, no melena witnessed.  Hemoglobin stable. Mild epigastric tenderness, good bowel sounds  Lipase remains mildly elevated from pancreatitis.  See initial consult note, CT with duodenal inflammation that is most likely reactive to acute on chronic pancreatitis.  He reports that he would like to try some food, so we have ordered a low-fat diet.  When he is tolerating solid food, change IV Protonix to oral twice daily dosing.  He should continue that for a month and then decrease to once daily dosing for a month.  Complete alcohol abstinence  Inpatient GI service signing off, call as needed.  He can go home when he is tolerating oral nutrition  I have  discontinued Carafate   Charlie Pitter III [phone]

## 2022-07-12 NOTE — Progress Notes (Signed)
[facility] Progress Note Patient: Samuel Blevins ZOX:[id number] DOB: 01-24-1966 DOA: 07/10/2022  DOS: the patient was seen and examined on 07/12/2022  Brief hospital course: PMH of chronic abdominal pain, recurrent pancreatitis, HTN, pancytopenia, alcohol abuse present to the hospital with complaints of abdominal pain nausea and vomiting. There was also concern for hematemesis. Found to have acute on chronic recurrent pancreatitis.  Treated conservatively.  GI consulted. Assessment and Plan: Acute on chronic recurrent pancreatitis likely from alcohol. No gallstones seen on the CT scan. CT scan shows evidence of acute pancreatitis. Lipase level minimally elevated 116 trending up [place] GI consulted. Treated conservatively. Currently advancing diet. GI currently signed off.   Duodenitis GERD Questionable hematemesis Patient reports hematemesis.  H&H relatively stable. CT also shows evidence of duodenitis most likely secondary to pancreatitis. On PPI twice daily at home.  Continue IV for now. Carafate discontinued per GI recommendation.   Alcohol abuse Drinks 1 pint of vodka on a daily basis. Last drink was on 5/15. On CIWA protocol right now. At high risk for withdrawal.   Possible UTI Urine shows nitrates. Patient without any symptoms. Monitor clinically.   Hypokalemia Hypomagnesemia. Replacement Monitor.   Chronic thrombocytopenia Holding DVT prophylaxis. SCDs. Secondary to alcohol abuse with Monitor    Hepatic steatosis Secondary to alcohol use. Monitor   Active smoker Nicotine patch. Smokes half a pack a day.   HTN Blood pressure stable. Hold metoprolol.  Continue amlodipine.   Subjective: Abdominal pain still present.  No nausea no vomiting.  Passing gas.  No BM.  No shortness of breath.  No chest pain.  So far no significant withdrawal symptoms.  Physical Exam: General: in Mild distress, No Rash Cardiovascular: S1 and S2 Present, No  Murmur Respiratory: Good respiratory effort, Bilateral Air entry present. No Crackles, No wheezes Abdomen: Bowel Sound present, mild diffuse tenderness Extremities: No edema Neuro: Alert and oriented x3, no new focal deficit  Data Reviewed: I have Reviewed nursing notes, Vitals, and Lab results. Since last encounter, pertinent lab results CBC and BMP lipase   . I have ordered test including CBC and CMP and lipase  .   Disposition: Status is: Inpatient Remains inpatient appropriate because: Monitor for improvement in overall diet tolerance  Place and maintain sequential compression device Start: 07/11/22 0848 SCDs Start: 07/11/22 0224   Family Communication: No one at bedside Level of care: Telemetry Surgical   Vitals:   07/12/22 0503 07/12/22 0827 07/12/22 1217 07/12/22 1619  BP: (!) 152/94 (!) 148/87 (!) 152/91 (!) 161/97  Pulse: 92 (!) 103 90 97  Resp: 18 17 17 17   Temp: 98 F (36.7 C) 98.4 F (36.9 C) 98.7 F (37.1 C) 99.2 F (37.3 C)  TempSrc: Oral  Oral Oral  SpO2: 98% (!) 71% 100% 100%  Weight:      Height:         Author: Lynden Oxford, MD 07/12/2022 5:25 PM  Please look on [email or web address] to find out who is on call.

## 2022-07-12 NOTE — Hospital Course (Signed)
PMH of chronic abdominal pain, recurrent pancreatitis, HTN, pancytopenia, alcohol abuse present to the hospital with complaints of abdominal pain nausea and vomiting. There was also concern for hematemesis. Found to have acute on chronic recurrent pancreatitis. GI consulted.  Conservatively treated.  Currently signed off.  Diet advanced.  Fluids stopped.  Developed early DTs with agitation on 5/17 night-5/18.

## 2022-07-13 DIAGNOSIS — R933 Abnormal findings on diagnostic imaging of other parts of digestive tract: Secondary | ICD-10-CM | POA: Diagnosis not present

## 2022-07-13 DIAGNOSIS — R1013 Epigastric pain: Secondary | ICD-10-CM | POA: Diagnosis not present

## 2022-07-13 DIAGNOSIS — K920 Hematemesis: Secondary | ICD-10-CM | POA: Diagnosis not present

## 2022-07-13 DIAGNOSIS — K852 Alcohol induced acute pancreatitis without necrosis or infection: Secondary | ICD-10-CM | POA: Diagnosis not present

## 2022-07-13 LAB — MAGNESIUM: Magnesium: 1.5 mg/dL — ABNORMAL LOW (ref 1.7–2.4)

## 2022-07-13 LAB — COMPREHENSIVE METABOLIC PANEL
ALT: 23 U/L (ref 0–44)
AST: 63 U/L — ABNORMAL HIGH (ref 15–41)
Albumin: 3.5 g/dL (ref 3.5–5.0)
Alkaline Phosphatase: 80 U/L (ref 38–126)
Anion gap: 14 (ref 5–15)
BUN: 9 mg/dL (ref 6–20)
CO2: 20 mmol/L — ABNORMAL LOW (ref 22–32)
Calcium: 9.9 mg/dL (ref 8.9–10.3)
Chloride: 95 mmol/L — ABNORMAL LOW (ref 98–111)
Creatinine, Ser: 1 mg/dL (ref 0.61–1.24)
GFR, Estimated: >60 mL/min (ref >60)
Glucose, Bld: 77 mg/dL (ref 70–99)
Potassium: 3.5 mmol/L (ref 3.5–5.1)
Sodium: 129 mmol/L — ABNORMAL LOW (ref 135–145)
Total Bilirubin: 2.2 mg/dL — ABNORMAL HIGH (ref 0.3–1.2)
Total Protein: 8.1 g/dL (ref 6.5–8.1)

## 2022-07-13 MED ORDER — PANTOPRAZOLE SODIUM 40 MG PO TBEC
40.0000 mg | DELAYED_RELEASE_TABLET | Freq: Two times a day (BID) | ORAL | Status: DC
  Administered 2022-07-14: 40 mg via ORAL
  Filled 2022-07-13: qty 1

## 2022-07-13 MED ORDER — CEFADROXIL 500 MG PO CAPS
1000.0000 mg | ORAL_CAPSULE | Freq: Every day | ORAL | Status: DC
  Administered 2022-07-13 – 2022-07-14 (×2): 1000 mg via ORAL
  Filled 2022-07-13 (×2): qty 2

## 2022-07-13 MED ORDER — CHLORDIAZEPOXIDE HCL 25 MG PO CAPS
25.0000 mg | ORAL_CAPSULE | Freq: Three times a day (TID) | ORAL | Status: DC
  Administered 2022-07-14: 25 mg via ORAL
  Filled 2022-07-13: qty 1

## 2022-07-13 MED ORDER — HALOPERIDOL LACTATE 5 MG/ML IJ SOLN
5.0000 mg | Freq: Once | INTRAMUSCULAR | Status: AC
  Administered 2022-07-13: 5 mg via INTRAVENOUS
  Filled 2022-07-13: qty 1

## 2022-07-13 MED ORDER — CHLORDIAZEPOXIDE HCL 25 MG PO CAPS
25.0000 mg | ORAL_CAPSULE | Freq: Once | ORAL | Status: AC
  Administered 2022-07-13: 25 mg via ORAL
  Filled 2022-07-13: qty 1

## 2022-07-13 MED ORDER — CHLORDIAZEPOXIDE HCL 25 MG PO CAPS
25.0000 mg | ORAL_CAPSULE | Freq: Four times a day (QID) | ORAL | Status: AC
  Administered 2022-07-13 (×4): 25 mg via ORAL
  Filled 2022-07-13 (×3): qty 1

## 2022-07-13 MED ORDER — LORAZEPAM 2 MG/ML IJ SOLN
1.0000 mg | Freq: Once | INTRAMUSCULAR | Status: AC
  Administered 2022-07-13: 1 mg via INTRAVENOUS
  Filled 2022-07-13: qty 1

## 2022-07-13 MED ORDER — HALOPERIDOL LACTATE 5 MG/ML IJ SOLN: 5.0000 mg | Freq: Four times a day (QID) | INTRAMUSCULAR | Status: DC | PRN

## 2022-07-13 MED ORDER — LOPERAMIDE HCL 2 MG PO CAPS: 2.0000 mg | ORAL_CAPSULE | ORAL | Status: DC | PRN

## 2022-07-13 MED ORDER — CHLORDIAZEPOXIDE HCL 25 MG PO CAPS: 25.0000 mg | ORAL_CAPSULE | Freq: Every day | ORAL | Status: DC

## 2022-07-13 MED ORDER — CHLORDIAZEPOXIDE HCL 25 MG PO CAPS
25.0000 mg | ORAL_CAPSULE | Freq: Four times a day (QID) | ORAL | Status: DC | PRN
  Filled 2022-07-13: qty 1

## 2022-07-13 MED ORDER — MAGNESIUM SULFATE 2 GM/50ML IV SOLN
2.0000 g | Freq: Once | INTRAVENOUS | Status: AC
  Administered 2022-07-13: 2 g via INTRAVENOUS
  Filled 2022-07-13: qty 50

## 2022-07-13 MED ORDER — CHLORDIAZEPOXIDE HCL 25 MG PO CAPS: 25.0000 mg | ORAL_CAPSULE | ORAL | Status: DC

## 2022-07-13 MED ORDER — HYDROXYZINE HCL 25 MG PO TABS
25.0000 mg | ORAL_TABLET | Freq: Four times a day (QID) | ORAL | Status: DC | PRN
  Administered 2022-07-13 – 2022-07-14 (×3): 25 mg via ORAL
  Filled 2022-07-13 (×3): qty 1

## 2022-07-13 NOTE — Progress Notes (Signed)
Patient restign quietly with sitter at this time after CIWA of 31 treated

## 2022-07-13 NOTE — Progress Notes (Signed)
[facility] Progress Note Patient: Samuel Blevins ZOX:[id number] DOB: 1965-11-06 DOA: 07/10/2022  DOS: the patient was seen and examined on 07/13/2022  Brief hospital course: PMH of chronic abdominal pain, recurrent pancreatitis, HTN, pancytopenia, alcohol abuse present to the hospital with complaints of abdominal pain nausea and vomiting. There was also concern for hematemesis. Found to have acute on chronic recurrent pancreatitis. GI consulted.  Conservatively treated.  Currently signed off.  Diet advanced.  Fluids stopped.  Developed early DTs with agitation on 5/17 night-5/18. Assessment and Plan: Acute on chronic recurrent pancreatitis likely from alcohol. No gallstones seen on the CT scan. CT scan shows evidence of acute pancreatitis. Lipase level minimally elevated 116 trending up [place] GI consulted. Treated conservatively. Currently advancing diet.  Tolerating well.  Fluids stopped.  No abdominal pain. GI currently signed off.  Alcohol abuse Early DTs.  Hallucination. Drinks 1 pint of vodka on a daily basis. Last drink was on 5/15. On CIWA protocol right now. Scheduled Librium.  As needed Haldol as well as IV Ativan. May require transfer to progressive care unit or even consult ICU for Precedex.  Duodenitis GERD Questionable hematemesis Patient reports hematemesis.  H&H relatively stable. CT also shows evidence of duodenitis most likely secondary to pancreatitis. On PPI twice daily at home.  Carafate discontinued per GI recommendation.   Possible UTI Urine shows nitrates. Due to confusion will add antibiotic for 3 days.   Hypokalemia Hypomagnesemia. Replaced. Monitor.   Chronic thrombocytopenia Holding DVT prophylaxis. SCDs. Secondary to alcohol abuse with Monitor    Hepatic steatosis Secondary to alcohol use. Monitor   Active smoker Nicotine patch. Smokes half a pack a day.   HTN Blood pressure stable. Hold metoprolol.  Continue  amlodipine.   Subjective: No abdominal pain.  No nausea no vomiting no fever no chills.  Severely agitated overnight with ongoing agitation right now. Hallucination also seen.  Physical Exam: In moderate distress. No respite S1-S2 present. Bowel sound present. No edema. Alert and oriented to self only.  Data Reviewed: I have Reviewed nursing notes, Vitals, and Lab results. Since last encounter, pertinent lab results CBC and BMP   .    Disposition: Status is: Inpatient Remains inpatient appropriate because: Monitor for improvement in overall diet tolerance  Place and maintain sequential compression device Start: 07/11/22 0848 SCDs Start: 07/11/22 0224   Family Communication: No one at bedside Level of care: Telemetry Surgical   Vitals:   07/12/22 1619 07/12/22 2040 07/13/22 0800 07/13/22 1450  BP: (!) 161/97 (!) 130/91 135/86 131/83  Pulse: 97 (!) 102 (!) 123 (!) 114  Resp: 17 18 20 18   Temp: 99.2 F (37.3 C) 98.2 F (36.8 C) 98.2 F (36.8 C) 99.1 F (37.3 C)  TempSrc: Oral  Oral Oral  SpO2: 100% 100% 100% 100%  Weight:      Height:         Author: Lynden Oxford, MD 07/13/2022 4:17 PM  Please look on [email or web address] to find out who is on call.

## 2022-07-13 NOTE — Progress Notes (Signed)
[facility] night cross cover note:   I was notified by RN that this patient is confused, agitated, attempting to get out of bed and leave.  Limited utility with attempts at verbal redirection.  I subsequently placed order for Ativan 1 mg IV x 1 dose now to further address the above.     Newton Pigg, DO Hospitalist

## 2022-07-14 DIAGNOSIS — K860 Alcohol-induced chronic pancreatitis: Secondary | ICD-10-CM

## 2022-07-14 LAB — CBC WITH DIFFERENTIAL/PLATELET
Abs Immature Granulocytes: 0.03 10*3/uL (ref 0.00–0.07)
Basophils Absolute: 0 10*3/uL (ref 0.0–0.1)
Basophils Relative: 1 %
Eosinophils Absolute: 0.2 10*3/uL (ref 0.0–0.5)
Eosinophils Relative: 4 %
HCT: 34.3 % — ABNORMAL LOW (ref 39.0–52.0)
Hemoglobin: 11.3 g/dL — ABNORMAL LOW (ref 13.0–17.0)
Immature Granulocytes: 1 %
Lymphocytes Relative: 31 %
Lymphs Abs: 1.3 10*3/uL (ref 0.7–4.0)
MCH: 28.9 pg (ref 26.0–34.0)
MCHC: 32.9 g/dL (ref 30.0–36.0)
MCV: 87.7 fL (ref 80.0–100.0)
Monocytes Absolute: 0.5 10*3/uL (ref 0.1–1.0)
Monocytes Relative: 13 %
Neutro Abs: 2.3 10*3/uL (ref 1.7–7.7)
Neutrophils Relative %: 50 %
Platelets: UNDETERMINED 10*3/uL (ref 150–400)
RBC: 3.91 MIL/uL — ABNORMAL LOW (ref 4.22–5.81)
RDW: 18.7 % — ABNORMAL HIGH (ref 11.5–15.5)
WBC: 4.3 10*3/uL (ref 4.0–10.5)
nRBC: 0 % (ref 0.0–0.2)

## 2022-07-14 LAB — MAGNESIUM: Magnesium: 1.6 mg/dL — ABNORMAL LOW (ref 1.7–2.4)

## 2022-07-14 LAB — COMPREHENSIVE METABOLIC PANEL
ALT: 32 U/L (ref 0–44)
AST: 94 U/L — ABNORMAL HIGH (ref 15–41)
Albumin: 3.4 g/dL — ABNORMAL LOW (ref 3.5–5.0)
Alkaline Phosphatase: 79 U/L (ref 38–126)
Anion gap: 13 (ref 5–15)
BUN: 12 mg/dL (ref 6–20)
CO2: 21 mmol/L — ABNORMAL LOW (ref 22–32)
Calcium: 9.6 mg/dL (ref 8.9–10.3)
Chloride: 97 mmol/L — ABNORMAL LOW (ref 98–111)
Creatinine, Ser: 0.82 mg/dL (ref 0.61–1.24)
GFR, Estimated: >60 mL/min (ref >60)
Glucose, Bld: 110 mg/dL — ABNORMAL HIGH (ref 70–99)
Potassium: 3.3 mmol/L — ABNORMAL LOW (ref 3.5–5.1)
Sodium: 131 mmol/L — ABNORMAL LOW (ref 135–145)
Total Bilirubin: 1.6 mg/dL — ABNORMAL HIGH (ref 0.3–1.2)
Total Protein: 8.3 g/dL — ABNORMAL HIGH (ref 6.5–8.1)

## 2022-07-14 LAB — PROCALCITONIN: Procalcitonin: 0.14 ng/mL

## 2022-07-14 MED ORDER — NICOTINE 14 MG/24HR TD PT24
14.0000 mg | MEDICATED_PATCH | Freq: Every day | TRANSDERMAL | 0 refills | Status: DC
  Filled 2022-07-14: qty 28, 28d supply, fill #0

## 2022-07-14 MED ORDER — HALOPERIDOL LACTATE 5 MG/ML IJ SOLN: 2.0000 mg | Freq: Four times a day (QID) | INTRAMUSCULAR | Status: DC | PRN

## 2022-07-14 MED ORDER — LORAZEPAM 2 MG/ML IJ SOLN: 1.0000 mg | INTRAMUSCULAR | Status: DC | PRN

## 2022-07-14 MED ORDER — CARVEDILOL 3.125 MG PO TABS: 3.1250 mg | ORAL_TABLET | Freq: Two times a day (BID) | ORAL | Status: DC

## 2022-07-14 MED ORDER — ADULT MULTIVITAMIN W/MINERALS CH
1.0000 | ORAL_TABLET | Freq: Every day | ORAL | 0 refills | Status: DC
  Filled 2022-07-14: qty 30, 30d supply, fill #0

## 2022-07-14 MED ORDER — LORAZEPAM 1 MG PO TABS
1.0000 mg | ORAL_TABLET | ORAL | Status: DC | PRN
  Administered 2022-07-14: 1 mg via ORAL
  Filled 2022-07-14: qty 1

## 2022-07-14 MED ORDER — PANTOPRAZOLE SODIUM 40 MG PO TBEC
40.0000 mg | DELAYED_RELEASE_TABLET | Freq: Two times a day (BID) | ORAL | 0 refills | Status: DC
  Filled 2022-07-14: qty 60, 30d supply, fill #0

## 2022-07-14 MED ORDER — THIAMINE HCL 100 MG PO TABS
100.0000 mg | ORAL_TABLET | Freq: Every day | ORAL | 0 refills | Status: DC
  Filled 2022-07-14: qty 30, 30d supply, fill #0

## 2022-07-14 MED ORDER — MAGNESIUM OXIDE 400 MG PO CAPS
1.0000 | ORAL_CAPSULE | Freq: Two times a day (BID) | ORAL | 0 refills | Status: DC
  Filled 2022-07-14: qty 60, 30d supply, fill #0

## 2022-07-14 MED ORDER — POTASSIUM CHLORIDE CRYS ER 20 MEQ PO TBCR
40.0000 meq | EXTENDED_RELEASE_TABLET | ORAL | Status: DC
  Administered 2022-07-14: 40 meq via ORAL
  Filled 2022-07-14: qty 2

## 2022-07-14 MED ORDER — FOLIC ACID 1 MG PO TABS
1.0000 mg | ORAL_TABLET | Freq: Every day | ORAL | 0 refills | Status: DC
  Filled 2022-07-14: qty 60, 60d supply, fill #0

## 2022-07-14 MED ORDER — POLYETHYLENE GLYCOL 3350 17 G PO PACK
17.0000 g | PACK | Freq: Every day | ORAL | 0 refills | Status: DC | PRN
  Filled 2022-07-14: qty 14, 14d supply, fill #0

## 2022-07-14 MED ORDER — ACETAMINOPHEN 325 MG PO TABS
650.0000 mg | ORAL_TABLET | Freq: Four times a day (QID) | ORAL | Status: DC | PRN
  Administered 2022-07-14: 650 mg via ORAL
  Filled 2022-07-14: qty 2

## 2022-07-14 MED ORDER — CEFADROXIL 500 MG PO CAPS
1000.0000 mg | ORAL_CAPSULE | Freq: Every day | ORAL | 0 refills | Status: AC
  Filled 2022-07-14: qty 10, 5d supply, fill #0

## 2022-07-14 MED ORDER — MAGNESIUM OXIDE 400 MG PO TABS
400.0000 mg | ORAL_TABLET | Freq: Two times a day (BID) | ORAL | 0 refills | Status: DC
  Filled 2022-07-14: qty 60, 30d supply, fill #0

## 2022-07-14 MED ORDER — PANCRELIPASE (LIP-PROT-AMYL) 12000-38000 UNITS PO CPEP
12000.0000 [IU] | ORAL_CAPSULE | Freq: Three times a day (TID) | ORAL | 0 refills | Status: DC
  Filled 2022-07-14: qty 90, 30d supply, fill #0

## 2022-07-14 MED ORDER — VITAMIN B-1 100 MG PO TABS
100.0000 mg | ORAL_TABLET | Freq: Every day | ORAL | 0 refills | Status: DC
  Filled 2022-07-14 – 2022-07-15 (×2): qty 100, 100d supply, fill #0

## 2022-07-14 MED ORDER — CEFADROXIL 500 MG PO CAPS
1000.0000 mg | ORAL_CAPSULE | Freq: Every day | ORAL | 0 refills | Status: DC
  Filled 2022-07-14: qty 10, 5d supply, fill #0

## 2022-07-14 MED ORDER — MAGNESIUM SULFATE 4 GM/100ML IV SOLN
4.0000 g | Freq: Once | INTRAVENOUS | Status: DC
  Filled 2022-07-14: qty 100

## 2022-07-14 MED ORDER — MAGNESIUM OXIDE -MG SUPPLEMENT 400 (240 MG) MG PO TABS
800.0000 mg | ORAL_TABLET | Freq: Two times a day (BID) | ORAL | Status: DC
  Administered 2022-07-14: 800 mg via ORAL
  Filled 2022-07-14: qty 2

## 2022-07-14 NOTE — TOC Transition Note (Signed)
Transition of Care [facility]) - CM/SW Discharge Note   Patient Details  Name: Samuel Blevins MRN: [id number] Date of Birth: Dec 30, 1965  [facility] of [facility]) CM/SW Contact:  Deatra Robinson, [place] Phone Number: 07/14/2022, 2:35 PM   Clinical Narrative:  Pt for dc back to [employer]. Bus passes provided. No other dc needs identified.   Dellie Burns, MSW, LCSW [phone] (coverage)       Final next level of care: Homeless Shelter Barriers to Discharge: No Barriers Identified   Patient Goals and CMS Choice      Discharge Placement                      Patient and family notified of of transfer: 07/14/22  Discharge Plan and Services Additional resources added to the After Visit Summary for   In-house Referral: Clinical Social Work                                   Social Determinants of Health (SDOH) Interventions SDOH Screenings   Food Insecurity: Food Insecurity Present (07/11/2022)  Housing: Medium Risk (07/11/2022)  Transportation Needs: No Transportation Needs (07/11/2022)  Utilities: Patient Declined (07/11/2022)  Depression (PHQ2-9): High Risk (02/14/2022)  Tobacco Use: High Risk (07/10/2022)     Readmission Risk Interventions     No data to display

## 2022-07-14 NOTE — Plan of Care (Signed)
  Problem: Pain Managment: Goal: General experience of comfort will improve Outcome: Progressing   Problem: Safety: Goal: Ability to remain free from injury will improve Outcome: Progressing   Problem: Skin Integrity: Goal: Risk for impaired skin integrity will decrease Outcome: Progressing   

## 2022-07-14 NOTE — Progress Notes (Signed)
[facility] night cross cover note:   I was notified by RN of the patient's mildly elevated temperature 100.2 F.  Mild sinus tachycardia with heart rates in the low 100s, which appears improved relative to heart rates during dayshift.  Other vital signs stable, including most recent blood pressure 118/83, respiratory rate 18, and maintaining oxygen saturations overnight in the range of 98 to 100% on room air.  I subsequently placed order for as needed acetaminophen.     Newton Pigg, DO Hospitalist

## 2022-07-14 NOTE — Discharge Summary (Addendum)
Physician Discharge Summary   Patient: Samuel Blevins MRN: [id number] DOB: [birth year]  Admit date:     07/10/2022  Discharge date: 07/14/22  Discharge Physician: Lynden Oxford  PCP: [name], MD  Recommendations at discharge: Follow up with PCP in 1 week [facility]    Follow-up Information     [name], MD. Schedule an appointment as soon as possible for a visit in 1 week(s).   Specialty: Pulmonary Disease Contact information: [address] [place] [address] [phone]                Discharge Diagnoses: Principal Problem:   Alcohol-induced chronic pancreatitis (HCC) Active Problems:   Nausea and vomiting in adult   Hematemesis with nausea   Essential hypertension  Hospital Course: PMH of chronic abdominal pain, recurrent pancreatitis, HTN, pancytopenia, alcohol abuse present to the hospital with complaints of abdominal pain nausea and vomiting. There was also concern for hematemesis. Found to have acute on chronic recurrent pancreatitis. GI consulted.  Conservatively treated.  Currently signed off.  Diet advanced.  Fluids stopped.  Developed agitation on 5/17 night-5/18.  Assessment and Plan  Acute on chronic recurrent pancreatitis likely from alcohol. No gallstones seen on the CT scan. CT scan shows evidence of acute pancreatitis. Lipase level minimally elevated 116 trending up. [facility] consulted. Treated conservatively. Currently advancing diet.  Tolerating well.  Fluids stopped.  No abdominal pain. GI currently signed off.  Requesting to go home.  Contracting to avoid alcohol going forward.   Alcohol abuse Hallucination. Drinks 1 pint of vodka on a daily basis. Last drink was on 5/15. Treated with CIWA protocol as well as scheduled Librium. Had a significant agitation event on 5/18 morning. Improved significantly with subsequent CIWA score less than  10. Continue thiamine folic acid and multivitamin at discharge. As there is a very high chance of alcohol abuse recurrence with not prescribing benzos on discharge and would recommend seeking counseling outpatient.   Duodenitis GERD Questionable hematemesis Patient reports hematemesis.  H&H relatively stable. CT also shows evidence of duodenitis most likely secondary to pancreatitis. On PPI twice daily at home.  Carafate discontinued per GI recommendation.   UTI Urine shows nitrates. Currently on cefadroxil.   Hypokalemia Hypomagnesemia. Replaced. Monitor.   Chronic thrombocytopenia Secondary to alcohol abuse Monitor    Hepatic steatosis Secondary to alcohol use. Monitor   Active smoker Nicotine patch. Smokes half a pack a day.   HTN Blood pressure stable. Hold metoprolol.  Continue amlodipine.   Consultants:  Gastroenterology   Procedures performed:  none  DISCHARGE MEDICATION: Allergies as of 07/14/2022   No Known Allergies      Medication List     STOP taking these medications    amLODipine 5 MG tablet Commonly known as: NORVASC   clobetasol cream 0.05 % Commonly known as: TEMOVATE   hydrOXYzine 25 MG tablet Commonly known as: ATARAX   ondansetron 8 MG tablet Commonly known as: ZOFRAN   senna-docusate 8.6-50 MG tablet Commonly known as: Senokot-S   sucralfate 1 g tablet Commonly known as: Carafate       TAKE these medications    acetaminophen 325 MG tablet Commonly known as: TYLENOL Take 2 tablets (650 mg total) by mouth every 6 (six) hours as needed for fever, moderate pain, mild pain or headache.   carvedilol 3.125 MG tablet Commonly known as: COREG Take 1 tablet (3.125 mg total) by  mouth 2 (two) times daily with a meal.   cefadroxil 500 MG capsule Commonly known as: DURICEF Take 2 capsules (1,000 mg total) by mouth daily for 5 days.   folic acid 1 MG tablet Commonly known as: FOLVITE Take 1 tablet (1 mg total) by mouth  daily.   Iron (Ferrous Sulfate) 325 (65 Fe) MG Tabs Take 1 tablet ( 325 mg) by mouth daily.   lipase/protease/amylase 12000-38000 units Cpep capsule Commonly known as: Creon Take 1 capsule (12,000 Units total) by mouth 3 (three) times daily before meals.   Magnesium Oxide 400 MG Caps Take 1 capsule (400 mg total) by mouth in the morning and at bedtime.   multivitamin with minerals Tabs tablet Take 1 tablet by mouth daily.   nicotine 14 mg/24hr patch Commonly known as: NICODERM CQ - dosed in mg/24 hours Place 1 patch (14 mg total) onto the skin daily.   pantoprazole 40 MG tablet Commonly known as: PROTONIX Take 1 tablet (40 mg total) by mouth 2 (two) times daily before a meal.   polyethylene glycol 17 g packet Commonly known as: MIRALAX / GLYCOLAX Take 17 g by mouth daily as needed for mild constipation or moderate constipation.   thiamine 100 MG tablet Commonly known as: VITAMIN B1 Take 1 tablet (100 mg total) by mouth daily.       Disposition: Homeless Diet recommendation: Cardiac diet  Discharge Exam: Vitals:   07/13/22 1924 07/13/22 2302 07/14/22 0619 07/14/22 0749  BP: 121/77 126/81 111/83 107/67  Pulse: (!) 107 (!) 104 (!) 104 (!) 104  Resp: 18 18 18 16   Temp: 99.2 F (37.3 C) 99.2 F (37.3 C) 100.2 F (37.9 C) 97.9 F (36.6 C)  TempSrc:    Oral  SpO2: 98% 99% 100% 100%  Weight:      Height:       General: Appear in mild distress; no visible Abnormal Neck Mass Or lumps, Conjunctiva normal Cardiovascular: S1 and S2 Present, no Murmur, Respiratory: good respiratory effort, Bilateral Air entry present and CTA, no Crackles, no wheezes Abdomen: Bowel Sound present, Non tender  Extremities: no Pedal edema Neurology: alert and oriented to time, place, and person no tremors and asterixis.  No focal deficit Filed Weights   07/10/22 1938  Weight: 80 kg   Condition at discharge: stable  The results of significant diagnostics from this hospitalization  (including imaging, microbiology, ancillary and laboratory) are listed below for reference.   Imaging Studies: DG Chest 2 View  Result Date: 07/11/2022 CLINICAL DATA:  Cough EXAM: CHEST - 2 VIEW COMPARISON:  06/16/2022 FINDINGS: The lungs are symmetrically well expanded. Previously noted right basilar consolidation and pleural effusion have resolved. The lungs are clear save for minimal left basilar atelectasis. No pneumothorax. Cardiac size within normal limits. Pulmonary vascularity is normal. IMPRESSION: 1. No active cardiopulmonary disease. Minimal left basilar atelectasis. Electronically Signed   By: Helyn Numbers M.D.   On: 07/11/2022 00:59   CT ABDOMEN PELVIS W CONTRAST  Result Date: 07/11/2022 CLINICAL DATA:  Abdominal pain and hematemesis.  Subjective fever. EXAM: CT ABDOMEN AND PELVIS WITH CONTRAST TECHNIQUE: Multidetector CT imaging of the abdomen and pelvis was performed using the standard protocol following bolus administration of intravenous contrast. RADIATION DOSE REDUCTION: This exam was performed according to the departmental dose-optimization program which includes automated exposure control, adjustment of the mA and/or kV according to patient size and/or use of iterative reconstruction technique. CONTRAST:  75mL OMNIPAQUE IOHEXOL 350 MG/ML SOLN COMPARISON:  CT abdomen  and pelvis with IV contrast 06/14/2022, CTA chest, abdomen and pelvis 06/10/2022, CT abdomen pelvis with IV contrast 06/07/2022. FINDINGS: Lower chest: Prior bilateral basal pulmonary infiltrates have resolved since last month. There is mild lower bronchial thickening without bronchial plugging. Scattered linear atelectasis or scarring without acute process. The cardiac size is normal. Hepatobiliary: Diffusely steatotic liver measuring 19 cm length. No mass enhancement. Unremarkable gallbladder and bile ducts. Pancreas: Interval increased inflammatory stranding around the head and uncinate process of the pancreas, with  trace fluid and additional inflammatory change in the pancreaticoduodenal groove. There are also significant inflammatory changes around the adjacent descending duodenum. Findings could all be due to pancreatitis, or could be due to a combination of duodenitis with or without duodenal ulcerative disease, and pancreatitis. Laboratory and clinical correlation advised. There are parenchymal calcifications again in the pancreatic head compatible with chronic calcific pancreatitis. The rest of the pancreas is unremarkable and there is no ductal dilatation or localizing fluid collection. There is no focal pancreatic hypoenhancement to suspect pancreatic necrosis. Spleen: Normal. Adrenals/Urinary Tract: No adrenal hemorrhage or renal injury identified. There is mild bladder thickening versus underdistention. There is an impression into the inferior bladder by the prostate. Stomach/Bowel: There are chronic thickened folds in the stomach. There is interval increased severe fold thickening in the descending duodenum, which could be due to severe duodenitis, duodenal ulcerative disease, or infiltrating disease. As above there are increased moderate inflammatory changes around the descending duodenum. The horizontal segment is only mildly thickened. The rest of the small bowel is unremarkable. An appendix is not seen in this patient. The large bowel unremarkable apart from diverticulosis. Vascular/Lymphatic: The hepatic portal vein, splenic vein and SMV are clear. There is aortoiliac atherosclerotic calcific plaque. There are borderline prominent reactive periportal lymph nodes up to 9 mm in short axis. No other adenopathy is seen. Reproductive: The prostate is not enlarged. Other: Scattered pelvic phleboliths. Prior umbilical hernia repair with small supraumbilical midline anterior wall fat hernia. There is scattered peripancreatic nonlocalizing fluid but no focal fluid collection. There is no free air, abscess, free  hemorrhage or incarcerated hernia. Musculoskeletal: Advanced degenerative disc disease L4-5 and L5-S1 with facet DJD. Bilateral SI joint bridging osteophytes. No acute or other significant osseous findings. IMPRESSION: 1. Interval increased inflammatory changes around the head and uncinate process of the pancreas, with increased inflammatory changes around the descending duodenum and increased severe duodenal thickening. Findings could be all due to acute pancreatitis, duodenitis with or without duodenal ulcerative disease, or combination. 2. 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 3. Hepatic steatosis. 4. Aortic atherosclerosis. 5. Cystitis versus bladder nondistention. 6. Diverticulosis. 7. Umbilical hernia repair with small supraumbilical midline anterior wall fat hernia. 8. Interval resolution of prior bilateral basal pulmonary infiltrates since the last CT. Aortic Atherosclerosis (ICD10-I70.0). Electronically Signed   By: Almira Bar M.D.   On: 07/11/2022 00:34   DG Chest Port 1 View  Result Date: 06/16/2022 CLINICAL DATA:  57 year old male with history of shortness of breath. EXAM: PORTABLE CHEST 1 VIEW COMPARISON:  Chest x-ray 06/13/2022. FINDINGS: Opacity in the right base obscuring the right hemidiaphragm, concerning for right lower lobe pneumonia. Ill-defined opacity also noted in the medial aspect of the left lung base. Small right pleural effusion. No left pleural effusion. No pneumothorax. No evidence of pulmonary edema. Heart size appears borderline enlarged. The patient is rotated to the right on today's exam, resulting in distortion of the mediastinal contours and reduced diagnostic sensitivity and specificity for mediastinal  pathology. IMPRESSION: 1. Right lower lobe pneumonia. 2. Additional area of atelectasis and/or consolidation in the left lower lobe medially. 3. Small right pleural effusion. Electronically Signed   By: Trudie Reed M.D.   On: 06/16/2022 09:15   DG Abd  Portable 1V  Result Date: 06/15/2022 CLINICAL DATA:  Abdominal pain EXAM: PORTABLE ABDOMEN - 1 VIEW COMPARISON:  06/14/2022 CT FINDINGS: Scattered large and small bowel gas is noted. No obstructive changes are seen. No free air is noted. No acute bony abnormality is seen. IMPRESSION: No acute abnormality noted. Should be noted the initial film is mismarked and corrected on the second film. Electronically Signed   By: Alcide Clever M.D.   On: 06/15/2022 09:46    Microbiology: Results for orders placed or performed during the hospital encounter of 06/13/22  Blood Culture (routine x 2)     Status: None   Collection Time: 06/13/22 11:10 PM   Specimen: BLOOD  Result Value Ref Range Status   Specimen Description BLOOD SITE NOT SPECIFIED  Final   Special Requests   Final    BOTTLES DRAWN AEROBIC AND ANAEROBIC Blood Culture adequate volume   Culture   Final    NO GROWTH 5 DAYS Performed at [facility], [address] [place], [place] [address]    Report Status 06/18/2022 FINAL  Final  Blood Culture (routine x 2)     Status: None   Collection Time: 06/13/22 11:10 PM   Specimen: BLOOD LEFT FOREARM  Result Value Ref Range Status   Specimen Description BLOOD LEFT FOREARM  Final   Special Requests   Final    BOTTLES DRAWN AEROBIC AND ANAEROBIC Blood Culture adequate volume   Culture   Final    NO GROWTH 5 DAYS Performed at [facility], [address] [place], [place] [address]    Report Status 06/18/2022 FINAL  Final  Resp panel by RT-PCR (RSV, Flu A&B, Covid) Anterior Nasal Swab     Status: None   Collection Time: 06/14/22 12:54 AM   Specimen: Anterior Nasal Swab  Result Value Ref Range Status   SARS Coronavirus 2 by RT PCR NEGATIVE NEGATIVE Final   Influenza A by PCR NEGATIVE NEGATIVE Final   Influenza B by PCR NEGATIVE NEGATIVE Final    Comment: (NOTE) The Xpert Xpress SARS-CoV-2/FLU/RSV plus assay is intended as an aid in the diagnosis of influenza from Nasopharyngeal  swab specimens and should not be used as a sole basis for treatment. Nasal washings and aspirates are unacceptable for Xpert Xpress SARS-CoV-2/FLU/RSV testing.  Fact Sheet for Patients: BloggerCourse.com  Fact Sheet for Healthcare Providers: SeriousBroker.it  This test is not yet approved or cleared by the [place] FDA and has been authorized for detection and/or diagnosis of SARS-CoV-2 by FDA under an Emergency Use Authorization (EUA). This EUA will remain in effect (meaning this test can be used) for the duration of the COVID-19 declaration under Section 564(b)(1) of the Act, 21 U.S.C. section 360bbb-3(b)(1), unless the authorization is terminated or revoked.     Resp Syncytial Virus by PCR NEGATIVE NEGATIVE Final    Comment: (NOTE) Fact Sheet for Patients: BloggerCourse.com  Fact Sheet for Healthcare Providers: SeriousBroker.it  This test is not yet approved or cleared by the [place] FDA and has been authorized for detection and/or diagnosis of SARS-CoV-2 by FDA under an Emergency Use Authorization (EUA). This EUA will remain in effect (meaning this test can be used) for the duration of the COVID-19  declaration under Section 564(b)(1) of the Act, 21 U.S.C. section 360bbb-3(b)(1), unless the authorization is terminated or revoked.  Performed at [facility], [address], [place], [place] [address]    Labs: CBC: Recent Labs  Lab 07/10/22 2015 07/11/22 0731 07/12/22 0853 07/14/22 1034  WBC 3.5* 4.4 4.4 4.3  NEUTROABS  --  2.7 2.9 2.3  HGB 11.2* 10.1* 11.2* 11.3*  HCT 34.7* 30.5* 34.1* 34.3*  MCV 88.1 87.4 87.0 87.7  PLT 117* 96* 75* PLATELET CLUMPS NOTED ON SMEAR, UNABLE TO ESTIMATE   Basic Metabolic Panel: Recent Labs  Lab 07/10/22 2015 07/10/22 2110 07/11/22 0731 07/12/22 0853 07/12/22 1846 07/13/22 1035 07/14/22 1034  NA 138  --   134* 128*  --  129* 131*  K 2.7*  --  3.4* 3.3*  --  3.5 3.3*  CL 99  --  99 92*  --  95* 97*  CO2 24  --  24 21*  --  20* 21*  GLUCOSE 82  --  76 78  --  77 110*  BUN <5*  --  <5* <5*  --  9 12  CREATININE 0.61  --  0.63 0.65  --  1.00 0.82  CALCIUM 9.0  --  8.5* 9.1  --  9.9 9.6  MG  --    < > 1.7 1.1* 1.7 1.5* 1.6*   < > = values in this interval not displayed.   Liver Function Tests: Recent Labs  Lab 07/10/22 2015 07/11/22 0731 07/12/22 0853 07/13/22 1035 07/14/22 1034  AST 75* 69* 46* 63* 94*  ALT 26 25 22 23  32  ALKPHOS 95 83 89 80 79  BILITOT 1.0 1.2 1.9* 2.2* 1.6*  PROT 9.3* 7.6 7.9 8.1 8.3*  ALBUMIN 3.7 3.2* 3.3* 3.5 3.4*   CBG: No results for input(s): "GLUCAP" in the last 168 hours.  Discharge time spent: greater than 30 minutes.  Signed: Lynden Oxford, MD [facility]

## 2022-07-14 NOTE — Plan of Care (Signed)
  Problem: Education: Goal: Knowledge of General Education information will improve Description: Including pain rating scale, medication(s)/side effects and non-pharmacologic comfort measures Outcome: Adequate for Discharge   Problem: Health Behavior/Discharge Planning: Goal: Ability to manage health-related needs will improve Outcome: Adequate for Discharge   Problem: Clinical Measurements: Goal: Ability to maintain clinical measurements within normal limits will improve Outcome: Adequate for Discharge Goal: Will remain free from infection Outcome: Adequate for Discharge Goal: Diagnostic test results will improve Outcome: Adequate for Discharge Goal: Respiratory complications will improve Outcome: Adequate for Discharge Goal: Cardiovascular complication will be avoided Outcome: Adequate for Discharge   Problem: Activity: Goal: Risk for activity intolerance will decrease Outcome: Adequate for Discharge   Problem: Nutrition: Goal: Adequate nutrition will be maintained Outcome: Adequate for Discharge   Problem: Coping: Goal: Level of anxiety will decrease Outcome: Adequate for Discharge   Problem: Elimination: Goal: Will not experience complications related to bowel motility Outcome: Adequate for Discharge Goal: Will not experience complications related to urinary retention Outcome: Adequate for Discharge   Problem: Pain Managment: Goal: General experience of comfort will improve Outcome: Adequate for Discharge   Problem: Safety: Goal: Ability to remain free from injury will improve Outcome: Adequate for Discharge   Problem: Skin Integrity: Goal: Risk for impaired skin integrity will decrease Outcome: Adequate for Discharge   Problem: Safety: Goal: Non-violent Restraint(s) Outcome: Adequate for Discharge   

## 2022-07-15 ENCOUNTER — Other Ambulatory Visit: Payer: Self-pay

## 2022-07-15 ENCOUNTER — Telehealth: Payer: Self-pay

## 2022-07-15 NOTE — Transitions of Care (Post Inpatient/ED Visit) (Signed)
   07/15/2022  Name: Samuel Blevins MRN: [id number] DOB: 10/11/1965  Today's TOC FU Call Status: Today's TOC FU Call Status:: Unsuccessul Call (1st Attempt) Unsuccessful Call (1st Attempt) Date: 07/15/22  Attempted to reach the patient regarding the most recent Inpatient/ED visit.  Follow Up Plan: Additional outreach attempts will be made to reach the patient to complete the Transitions of Care (Post Inpatient/ED visit) call.   Signature  Robyne Peers, RN

## 2022-07-16 ENCOUNTER — Telehealth: Payer: Self-pay

## 2022-07-16 NOTE — Transitions of Care (Post Inpatient/ED Visit) (Signed)
   07/16/2022  Name: Samuel Blevins MRN: [id number] DOB: 25-May-1965  Today's TOC FU Call Status: Unsuccessful Call (1st Attempt) Date: 07/15/22 Unsuccessful Call (2nd Attempt) Date: 07/16/22  Attempted to reach the patient regarding the most recent Inpatient/ED visit.  Follow Up Plan: Additional outreach attempts will be made to reach the patient to complete the Transitions of Care (Post Inpatient/ED visit) call.   The patient has an appointment tomorrow, 07/17/2022 at [facility] with Dr Cameron Proud  Robyne Peers, RN

## 2022-07-17 ENCOUNTER — Encounter: Payer: Self-pay | Admitting: *Deleted

## 2022-07-17 ENCOUNTER — Ambulatory Visit: Payer: Medicaid Other | Attending: Critical Care Medicine | Admitting: Critical Care Medicine

## 2022-07-17 ENCOUNTER — Other Ambulatory Visit: Payer: Self-pay

## 2022-07-17 ENCOUNTER — Encounter: Payer: Self-pay | Admitting: Critical Care Medicine

## 2022-07-17 ENCOUNTER — Telehealth: Payer: Self-pay

## 2022-07-17 VITALS — BP 119/76 | HR 101 | Temp 98.6°F | Ht 73.0 in | Wt 165.0 lb

## 2022-07-17 DIAGNOSIS — K2990 Gastroduodenitis, unspecified, without bleeding: Secondary | ICD-10-CM | POA: Diagnosis not present

## 2022-07-17 DIAGNOSIS — K219 Gastro-esophageal reflux disease without esophagitis: Secondary | ICD-10-CM | POA: Insufficient documentation

## 2022-07-17 DIAGNOSIS — R7401 Elevation of levels of liver transaminase levels: Secondary | ICD-10-CM | POA: Diagnosis not present

## 2022-07-17 DIAGNOSIS — K2970 Gastritis, unspecified, without bleeding: Secondary | ICD-10-CM

## 2022-07-17 DIAGNOSIS — F17210 Nicotine dependence, cigarettes, uncomplicated: Secondary | ICD-10-CM | POA: Insufficient documentation

## 2022-07-17 DIAGNOSIS — Z5986 Financial insecurity: Secondary | ICD-10-CM | POA: Insufficient documentation

## 2022-07-17 DIAGNOSIS — I10 Essential (primary) hypertension: Secondary | ICD-10-CM

## 2022-07-17 DIAGNOSIS — F1010 Alcohol abuse, uncomplicated: Secondary | ICD-10-CM | POA: Insufficient documentation

## 2022-07-17 DIAGNOSIS — K8681 Exocrine pancreatic insufficiency: Secondary | ICD-10-CM | POA: Diagnosis not present

## 2022-07-17 DIAGNOSIS — Z79899 Other long term (current) drug therapy: Secondary | ICD-10-CM | POA: Insufficient documentation

## 2022-07-17 DIAGNOSIS — K700 Alcoholic fatty liver: Secondary | ICD-10-CM | POA: Insufficient documentation

## 2022-07-17 DIAGNOSIS — K7010 Alcoholic hepatitis without ascites: Secondary | ICD-10-CM

## 2022-07-17 DIAGNOSIS — S12601A Unspecified nondisplaced fracture of seventh cervical vertebra, initial encounter for closed fracture: Secondary | ICD-10-CM

## 2022-07-17 DIAGNOSIS — K2920 Alcoholic gastritis without bleeding: Secondary | ICD-10-CM | POA: Diagnosis not present

## 2022-07-17 DIAGNOSIS — Z5941 Food insecurity: Secondary | ICD-10-CM | POA: Diagnosis not present

## 2022-07-17 DIAGNOSIS — K860 Alcohol-induced chronic pancreatitis: Secondary | ICD-10-CM

## 2022-07-17 DIAGNOSIS — E876 Hypokalemia: Secondary | ICD-10-CM | POA: Diagnosis not present

## 2022-07-17 DIAGNOSIS — G621 Alcoholic polyneuropathy: Secondary | ICD-10-CM | POA: Insufficient documentation

## 2022-07-17 DIAGNOSIS — D61818 Other pancytopenia: Secondary | ICD-10-CM | POA: Insufficient documentation

## 2022-07-17 DIAGNOSIS — I81 Portal vein thrombosis: Secondary | ICD-10-CM | POA: Diagnosis not present

## 2022-07-17 DIAGNOSIS — Z5901 Sheltered homelessness: Secondary | ICD-10-CM | POA: Insufficient documentation

## 2022-07-17 DIAGNOSIS — N3001 Acute cystitis with hematuria: Secondary | ICD-10-CM

## 2022-07-17 DIAGNOSIS — N390 Urinary tract infection, site not specified: Secondary | ICD-10-CM | POA: Diagnosis not present

## 2022-07-17 MED ORDER — TRAMADOL HCL 50 MG PO TABS
50.0000 mg | ORAL_TABLET | Freq: Three times a day (TID) | ORAL | 0 refills | Status: DC | PRN
  Filled 2022-07-17: qty 30, 10d supply, fill #0

## 2022-07-17 NOTE — Assessment & Plan Note (Signed)
Still present due to alcohol

## 2022-07-17 NOTE — Assessment & Plan Note (Signed)
As per gastritis gastroduodenitis

## 2022-07-17 NOTE — Assessment & Plan Note (Signed)
Persist with alcohol use

## 2022-07-17 NOTE — Assessment & Plan Note (Signed)
Has not picked up cephalosporin will reorder

## 2022-07-17 NOTE — Assessment & Plan Note (Signed)
Gastroduodenitis causing vomiting and inflammation stop Carafate continue twice daily pantoprazole

## 2022-07-17 NOTE — Patient Instructions (Signed)
Start taking tramadol 1 every 8 hours as needed for pain the nurse will bring the prescription to you  You have an antibiotic to take this has been filled the nurse will take that to you  You are to stop the Carafate we will make sure that is taken out of your pill organizer  You will be increasing your pantoprazole to twice a day will make sure that is put in your pill organizer  All other medications are unchanged and will be placed in the pill organizer  Dr. [name] will continue to check on you at the shelter  Transportation will be provided

## 2022-07-17 NOTE — Congregational Nurse Program (Signed)
  Dept: [phone]   Congregational Nurse Program Note  Date of Encounter: 07/17/2022  Past Medical History: Past Medical History:  Diagnosis Date   Alcohol withdrawal syndrome with complication (HCC)    Alcoholism (HCC)    Elevated AST (SGOT)    Gastrointestinal hemorrhage    Homeless    Hypertension    Pancreatic insufficiency    takes Creon   Symptomatic anemia 11/23/2015   Thrombocytopenia (HCC) 05/09/2021    Encounter Details:  CNP Questionnaire - 07/17/22 1505       Questionnaire   Ask client: Do you give verbal consent for me to treat you today? Yes    Student Assistance N/A    Location Patient Served  [facility]    Visit Setting with Client Organization    Patient Status Unhoused    Insurance Medicaid    Insurance/Financial Assistance Referral N/A    Medication Have Medication Insecurities;Provided Medication Assistance    Medical Provider Yes    Screening Referrals Made N/A    Medical Referrals Made N/A    Medical Appointment Made N/A    Recently w/o PCP, now 1st time PCP visit completed due to CNs referral or appointment made N/A    Food N/A    Transportation N/A    Housing/Utilities No permanent housing    Interpersonal Safety N/A    Interventions Advocate/Support;Reviewed Medications    Abnormal to Normal Screening Since Last CN Visit N/A    Screenings CN Performed N/A    Sent Client to Lab for: N/A    Did client attend any of the following based off CNs referral or appointments made? N/A    ED Visit Averted N/A    Life-Saving Intervention Made N/A            Picked up medication at [facility] per MD request and brought to [facility]. Took carafate out of pill box per MD request and checked for pantaprozol twice a day. One full day was missing and client reports he has it in another bag and will take care of it. Offered to assist with new organizer and refill as some medications appear to be missing from [employer]. Client declines. Will continue to encourage  client to take medications as ordered. Client has new medication ordered (antiobotic and pain medication) placed separately in his medication bag. Lacara Dunsworth W RN CN

## 2022-07-17 NOTE — Assessment & Plan Note (Signed)
Resolved

## 2022-07-17 NOTE — Progress Notes (Signed)
Established Patient Office Visit  Subjective   Patient ID: Samuel Blevins, male    DOB: 08-Dec-1965  Age: 57 y.o. MRN: [id number]  Cc post hosp fu  08/02/21 This is a 57 year old male who has had previous history of pancreatic pseudocyst pancreatitis and severe alcoholism.  He has been drinking a pint of vodka daily.  He is at the [employer].  We brought him into the health and wellness office today to get labs and further examine him.  He had abdominal pain in the mid epigastric area that is worsening.  He has been vomiting coffee-ground material with occasional small amounts of blood.  He has had some dark melanotic stools.  The patient has been hospitalized in March of this year but never really had gastroenterology follow-up.  I tried to engage him at the shelter at least twice in the last 2 months he finally engaged with me and came to the office today for examination and emergency labs.  In the process of the stat labs being drawn we had his medications refilled that he had not been taking.  The labs came back positive for a lipase of greater than 1200 and have been in the low-grade 100 range.  Hemoglobin is about the same 7-1/2 platelet count is actually up his liver function actually is remarkably not any worse.  Because he has had a pancreatic pseudocyst I believe he needs CT imaging urgently and we cannot accomplish this as an outpatient particular given the fact he lives in a homeless shelter  We contacted the emergency room triage nurse at [facility], ER and told him we will be sending him over and actually he is going over to the emergency room from the homeless shelter.  He is stable at this time is not hemodynamically unstable and is not septic.  It would be best if he could have gastroenterology see him during this admission and perform an upper endoscopy as well.  Note the patient needs a rollator to walk with his alcoholism he has had significant alcoholic  neuropathy.    12/21 Patient seen for transition of care post hospital visit he had been seen previously at the homeless shelter for such same visit.  He has history of severe alcoholism recently fell had a partial fracture of C7 nondisplaced.  He has alcoholic pancreatitis and as well steatosis also has gastritis.  He has chronic abdominal pain he has bowel movements that are frequent with some blood in the stool and occasionally has emesis with blood in the stool.  Comes into the office very shaky has not had any drink today he has been shaking and jerking some.  He is not adherent with all his medications as listed.  Below is a copy of the discharge summary to which he left AMA. Physician Discharge Summary   [name] NWG:[id number] DOB: 12-13-1965 DOA: 01/30/2022   PCP: [name], MD   Admit date: 01/30/2022 Discharge date: 02/01/2022 LEFT AMA     Admitted From: home Disposition:  home Discharging physician: Lewie Chamber, MD   Recommendations for Outpatient Follow-up:  1. Patient instructed to follow-up with neurosurgery regarding c-collar      Hospital Course: Samuel Blevins is a 57 yo male with PMH alcohol misuse disorder, chronic pancreatitis, pancreatic pseudocyst, chronic pancreatic insufficiency, hypertension, chronic anemia and thrombocytopenia, homelessness who presented to the ED after a fall.  He had also reported an episode of bloody emesis and dark stools prior  to admission.  He has had prior EGDs during similar evaluations for hematemesis. He does continue to consume alcohol and resides at [employer] [facility]. Hemoglobin is stable and at baseline on admission.  GI was consulted for further evaluation.   GI bleed workup was placed on hold in setting of his ensuing alcohol withdrawal. He required escalation up to Precedex drip along with phenobarb taper. Despite use with Ativan and Haldol as well, he continued to have ongoing withdrawal symptoms. He was deemed to have  capacity and was insistent on leaving AMA multiple times. Risks and benefits were discussed prior to patient leaving and he still insisted on leaving.  He was given contact information for [place] [facility] for following up regarding his suspected C7 fracture.  Cervical collar in place at time of discharge. Despite trying to encourage patient to remain in the hospital, he still chose to leave AMA.   Assessment and Plan: * GI bleed - Prior history noted similar with hematemesis.  He also describes dark stools and has not been able to have outpt colonoscopy due to homelessness - continues to consume alcohol - last EGD June 2023 noted with peptic duodenitis and reactive gastropathy with mild chronic gastritis -Currently hemoglobin stable.  Continue trending; no further episodes since admission - GI following, appreciate assistance.  Tentative plan for more what sounds like inpatient EGD.  Patient may also benefit from colonoscopy if having difficulty obtaining outpatient; decision deferred to GI   Cervical spine fracture (HCC) - s/p mechanical fall also with contribution from alcohol intoxication -CT C-spine shows "possible subtle nondisplaced fracture through the superior articular facet of C7 on the right" -Neurosurgery recommending c-collar and outpatient follow-up   Alcohol dependence syndrome (HCC) - Ethanol level 360 on admission -Patient endorsed that his last drink was Tuesday evening - Continue CIWA protocol -Continue thiamine, folate, multivitamin   Pancytopenia (HCC) - Again, suspected due to bone marrow suppression from chronic alcohol use   Hypokalemia - Replete as needed   Thrombocytopenia (HCC) - Likely due to bone marrow suppression from chronic alcohol use - Continue trending   Essential hypertension - Resume amlodipine   Transaminitis - Due to chronic alcohol use - Continue trending       Principal Diagnosis: GI bleed   Discharge Diagnoses:  Active  Hospital Problems   Diagnosis Date Noted  GI bleed 01/30/2022     Priority: 1.  Cervical spine fracture (HCC) 01/31/2022     Priority: 2.  Alcohol dependence syndrome (HCC) 06/21/2019     Priority: 4.  GIB (gastrointestinal bleeding) 01/31/2022  Pancytopenia (HCC) 01/31/2022  Hypokalemia    Thrombocytopenia (HCC) 05/09/2021  Transaminitis    Essential hypertension 03/17/2014    This patient is attempting to get disability has multiple medical problems that are not being well addressed and are contributing to his disability.  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   Patient is also having difficulty with ambulation needs a walker for this.  He is still drinking about 1-2 small airplane bottles of vodka daily.  Disability paperwork was received.  07/17/22 This is a 57 year old male with chronic alcohol abuse chronic pancreatitis alcoholic gastritis duodenitis portal vein thrombosis.  This patient continues to drink about 1/5-2/5 of vodka a day.  He was recently hospitalized between the 15th and 19 May as documented below he wanted to go  home but he did not get discharged AMA this time.  He was detoxed with Librium but states today he is gone back to drinking immediately after he left the hospital.  Patient has severe pain in the pancreas with this alcohol use.  GI saw the patient and recommended increasing pantoprazole to twice a day and stopping the Carafate  Below is the discharge summary Adm 5/15- 07/14/22 Discharge Diagnoses: Principal Problem:   Alcohol-induced chronic pancreatitis (HCC) Active Problems:   Nausea and vomiting in adult   Hematemesis with nausea   Essential hypertension   Hospital Course: PMH of chronic abdominal pain, recurrent pancreatitis, HTN, pancytopenia, alcohol abuse present to  the hospital with complaints of abdominal pain nausea and vomiting. There was also concern for hematemesis. Found to have acute on chronic recurrent pancreatitis. GI consulted.  Conservatively treated.  Currently signed off.  Diet advanced.  Fluids stopped.  Developed agitation on 5/17 night-5/18.   Assessment and Plan  Acute on chronic recurrent pancreatitis likely from alcohol. No gallstones seen on the CT scan. CT scan shows evidence of acute pancreatitis. Lipase level minimally elevated 116 trending up. [facility] consulted. Treated conservatively. Currently advancing diet.  Tolerating well.  Fluids stopped.  No abdominal pain. GI currently signed off.  Requesting to go home.  Contracting to avoid alcohol going forward.   Alcohol abuse Hallucination. Drinks 1 pint of vodka on a daily basis. Last drink was on 5/15. Treated with CIWA protocol as well as scheduled Librium. Had a significant agitation event on 5/18 morning. Improved significantly with subsequent CIWA score less than 10. Continue thiamine folic acid and multivitamin at discharge. As there is a very high chance of alcohol abuse recurrence with not prescribing benzos on discharge and would recommend seeking counseling outpatient.   Duodenitis GERD Questionable hematemesis Patient reports hematemesis.  H&H relatively stable. CT also shows evidence of duodenitis most likely secondary to pancreatitis. On PPI twice daily at home.  Carafate discontinued per GI recommendation.   UTI Urine shows nitrates. Currently on cefadroxil.   Hypokalemia Hypomagnesemia. Replaced. Monitor.   Chronic thrombocytopenia Secondary to alcohol abuse Monitor    Hepatic steatosis Secondary to alcohol use. Monitor   Active smoker Nicotine patch. Smokes half a pack a day.   HTN Blood pressure stable. Hold metoprolol.  Continue amlodipine.    Blood on arrival blood pressure is good 119/76 he does maintain amlodipine daily he  was to take cefadroxil antibiotic for urine infection he is yet to pick this up  They did not discharge the patient on Librium because of high risk still smoking daily    Patient Active Problem List   Diagnosis Date Noted   Urinary tract infection 07/17/2022   Abnormal finding on GI tract imaging 07/13/2022   Abdominal pain, epigastric 07/11/2022   Acute kidney injury (nontraumatic) (HCC) 06/10/2022   Fall 04/16/2022   Alcohol withdrawal with inpatient treatment with perceptual disturbance (HCC) 04/13/2022   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   Portal vein thrombosis 08/03/2021   Alcoholic pancreatitis 08/03/2021   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   Anxiety and depression 07/04/2021   Homeless 07/04/2021   Osteoarthritis 07/04/2021   Thrombocytopenia (HCC) 05/09/2021   Pancreatic insufficiency 05/08/2021   Chronic alcohol abuse 05/07/2021   Alcoholic steatohepatitis 12/07/2020   Iron deficiency anemia due to chronic blood loss 12/07/2020   Seizure (HCC) 12/04/2020   Alcohol withdrawal (HCC) 09/16/2020   Chronic pain syndrome 12/28/2019   Pancreatic pseudocyst 10/28/2019  Gastritis and gastroduodenitis    AVM (arteriovenous malformation) of small bowel, acquired    Transaminitis    Alcohol dependence syndrome (HCC) 06/21/2019   Chronic anemia 11/23/2015   Tobacco abuse 03/17/2014   Essential hypertension 03/17/2014   Past Medical History:  Diagnosis Date   Alcohol withdrawal syndrome with complication (HCC)    Alcoholism (HCC)    Elevated AST (SGOT)    Gastrointestinal hemorrhage    Homeless    Hypertension    Pancreatic insufficiency    takes Creon   Symptomatic anemia 11/23/2015   Thrombocytopenia (HCC) 05/09/2021   Past Surgical History:  Procedure Laterality Date   BIOPSY  06/23/2019   Procedure: BIOPSY;  Surgeon: Shellia Cleverly, DO;  Location: [facility] ENDOSCOPY;  Service: Gastroenterology;;   BIOPSY  12/12/2020    Procedure: BIOPSY;  Surgeon: Lynann Bologna, MD;  Location: [facility] ENDOSCOPY;  Service: Endoscopy;;   BIOPSY  08/04/2021   Procedure: BIOPSY;  Surgeon: Jenel Lucks, MD;  Location: [facility] ENDOSCOPY;  Service: Gastroenterology;;   ESOPHAGOGASTRODUODENOSCOPY N/A 08/04/2021   Procedure: ESOPHAGOGASTRODUODENOSCOPY (EGD);  Surgeon: Jenel Lucks, MD;  Location: [facility] ENDOSCOPY;  Service: Gastroenterology;  Laterality: N/A;   ESOPHAGOGASTRODUODENOSCOPY (EGD) WITH PROPOFOL N/A 06/23/2019   Procedure: ESOPHAGOGASTRODUODENOSCOPY (EGD) WITH PROPOFOL;  Surgeon: Shellia Cleverly, DO;  Location: [facility] ENDOSCOPY;  Service: Gastroenterology;  Laterality: N/A;   ESOPHAGOGASTRODUODENOSCOPY (EGD) WITH PROPOFOL N/A 12/12/2020   Procedure: ESOPHAGOGASTRODUODENOSCOPY (EGD) WITH PROPOFOL;  Surgeon: Lynann Bologna, MD;  Location[facility] ENDOSCOPY;  Service: Endoscopy;  Laterality: N/A;   HOT HEMOSTASIS N/A 06/23/2019   Procedure: HOT HEMOSTASIS (ARGON PLASMA COAGULATION/BICAP);  Surgeon: Shellia Cleverly, DO;  Location[facility] ENDOSCOPY;  Service: Gastroenterology;  Laterality: N/A;   Social History   Tobacco Use   Smoking status: Every Day    Packs/day: 0.50    Years: 30.00    Additional pack years: 0.00    Total pack years: 15.00    Types: Cigarettes   Smokeless tobacco: Never  Vaping Use   Vaping Use: Never used  Substance Use Topics   Alcohol use: Yes    Alcohol/week: 28.0 standard drinks of alcohol    Types: 28 Shots of liquor per week   Drug use: Never   Social History   Socioeconomic History   Marital status: Single    Spouse name: Not on file   Number of children: Not on file   Years of education: Not on file   Highest education level: Not on file  Occupational History   Not on file  Tobacco Use   Smoking status: Every Day    Packs/day: 0.50    Years: 30.00    Additional pack years: 0.00    Total pack years: 15.00    Types: Cigarettes   Smokeless tobacco: Never  Vaping Use   Vaping Use: Never  used  Substance and Sexual Activity   Alcohol use: Yes    Alcohol/week: 28.0 standard drinks of alcohol    Types: 28 Shots of liquor per week   Drug use: Never   Sexual activity: Not Currently  Other Topics Concern   Not on file  Social History Narrative   Not on file   Social Determinants of Health   Financial Resource Strain: Not on file  Food Insecurity: Food Insecurity Present (07/11/2022)   Hunger Vital Sign    Worried About Running Out of Food in the Last Year: Sometimes true    Ran Out of Food in the Last Year: Sometimes true  Transportation Needs: No Transportation Needs (07/11/2022)   PRAPARE - Administrator, Civil Service (Medical): No    Lack of Transportation (Non-Medical): No  Physical Activity: Not on file  Stress: Not on file  Social Connections: Not on file  Intimate Partner Violence: Not At Risk (07/11/2022)   Humiliation, Afraid, Rape, and Kick questionnaire    Fear of Current or Ex-Partner: No    Emotionally Abused: No    Physically Abused: No    Sexually Abused: No   Family Status  Relation Name Status   Neg Hx  (Not Specified)   Family History  Problem Relation Age of Onset   Diabetes Mellitus II Neg Hx    Colon cancer Neg Hx    Stomach cancer Neg Hx    Pancreatic cancer Neg Hx    No Known Allergies    Review of Systems  Constitutional:  Positive for malaise/fatigue and weight loss. Negative for chills, diaphoresis and fever.  HENT:  Positive for nosebleeds. Negative for congestion, ear discharge, ear pain, hearing loss, sore throat and tinnitus.   Eyes:  Negative for blurred vision, double vision, photophobia and discharge.  Respiratory:  Negative for cough, hemoptysis, sputum production, shortness of breath, wheezing and stridor.        No excess mucus  Cardiovascular:  Positive for leg swelling. Negative for chest pain, palpitations, orthopnea, claudication and PND.  Gastrointestinal:  Positive for abdominal pain, blood in stool,  diarrhea, heartburn, melena, nausea and vomiting. Negative for constipation.  Genitourinary:  Negative for dysuria, flank pain, frequency, hematuria and urgency.  Musculoskeletal:  Negative for back pain, falls, joint pain, myalgias and neck pain.       Gait disturbance  Skin:  Negative for itching and rash.  Neurological:  Positive for dizziness, weakness and headaches. Negative for tingling, tremors, sensory change, speech change, focal weakness, seizures and loss of consciousness.  Endo/Heme/Allergies:  Negative for environmental allergies and polydipsia. Does not bruise/bleed easily.  Psychiatric/Behavioral:  Positive for depression and memory loss. Negative for hallucinations, substance abuse and suicidal ideas. The patient is nervous/anxious. The patient does not have insomnia.   All other systems reviewed and are negative.     Objective:     BP 119/76 (BP Location: Left Arm, Patient Position: Sitting, Cuff Size: Normal)   Pulse (!) 101   Temp 98.6 F (37 C) (Oral)   Ht 6\' 1"  (1.854 m)   Wt 165 lb (74.8 kg)   SpO2 96%   BMI 21.77 kg/m  BP Readings from Last 3 Encounters:  07/17/22 119/76  07/14/22 107/67  07/10/22 125/78   Wt Readings from Last 3 Encounters:  07/17/22 165 lb (74.8 kg)  07/10/22 176 lb 5.9 oz (80 kg)  06/11/22 173 lb 11.6 oz (78.8 kg)      Physical Exam Vitals reviewed.  Constitutional:      Appearance: Normal appearance. He is well-developed. He is ill-appearing. He is not diaphoretic.     Comments: Withdrawn affect  HENT:     Head: Normocephalic and atraumatic.     Nose: Nose normal. No nasal deformity, septal deviation, mucosal edema, congestion or rhinorrhea.     Right Sinus: No maxillary sinus tenderness or frontal sinus tenderness.     Left Sinus: No maxillary sinus tenderness or frontal sinus tenderness.     Mouth/Throat:     Mouth: Mucous membranes are dry.     Pharynx: Oropharynx is clear. No oropharyngeal exudate.  Eyes:  General:  No scleral icterus.    Conjunctiva/sclera: Conjunctivae normal.     Pupils: Pupils are equal, round, and reactive to light.  Neck:     Thyroid: No thyromegaly.     Vascular: No carotid bruit or JVD.     Trachea: Trachea normal. No tracheal tenderness or tracheal deviation.     Comments: Neck brace in place Cardiovascular:     Rate and Rhythm: Normal rate and regular rhythm.     Chest Wall: PMI is not displaced.     Pulses: Normal pulses. No decreased pulses.     Heart sounds: Normal heart sounds, S1 normal and S2 normal. Heart sounds not distant. No murmur heard.    No systolic murmur is present.     No diastolic murmur is present.     No friction rub. No gallop. No S3 or S4 sounds.  Pulmonary:     Effort: No tachypnea, accessory muscle usage or respiratory distress.     Breath sounds: No stridor. No decreased breath sounds, wheezing, rhonchi or rales.  Chest:     Chest wall: No tenderness.  Abdominal:     General: Bowel sounds are normal. There is distension.     Palpations: Abdomen is not rigid. There is no mass.     Tenderness: There is abdominal tenderness. There is guarding. There is no right CVA tenderness, left CVA tenderness or rebound.     Hernia: No hernia is present.  Musculoskeletal:        General: Normal range of motion.     Cervical back: Normal range of motion and neck supple. No edema, erythema, rigidity or tenderness. No muscular tenderness. Normal range of motion.  Lymphadenopathy:     Head:     Right side of head: No submental or submandibular adenopathy.     Left side of head: No submental or submandibular adenopathy.     Cervical: No cervical adenopathy.  Skin:    General: Skin is warm and dry.     Coloration: Skin is not pale.     Findings: No rash.     Nails: There is no clubbing.  Neurological:     General: No focal deficit present.     Mental Status: He is alert and oriented to person, place, and time.     Sensory: Sensory deficit present.      Motor: Weakness present.     Coordination: Coordination abnormal.     Gait: Gait abnormal.     Deep Tendon Reflexes: Reflexes abnormal.     Comments: Resting tremor  Psychiatric:        Attention and Perception: Perception normal. He is inattentive.        Mood and Affect: Mood is not anxious or depressed. Affect is tearful.        Speech: Speech normal.        Behavior: Behavior is withdrawn. Behavior is cooperative.        Thought Content: Thought content does not include homicidal or suicidal ideation. Thought content does not include homicidal or suicidal plan.        Cognition and Memory: Cognition is impaired. Memory is impaired. He exhibits impaired recent memory and impaired remote memory.        Judgment: Judgment is impulsive.      No results found for any visits on 07/17/22.   Last CBC Lab Results  Component Value Date   WBC 4.3 07/14/2022   HGB 11.3 (L) 07/14/2022   HCT  34.3 (L) 07/14/2022   MCV 87.7 07/14/2022   MCH 28.9 07/14/2022   RDW 18.7 (H) 07/14/2022   PLT PLATELET CLUMPS NOTED ON SMEAR, UNABLE TO ESTIMATE 07/14/2022   Last metabolic panel Lab Results  Component Value Date   GLUCOSE 110 (H) 07/14/2022   NA 131 (L) 07/14/2022   K 3.3 (L) 07/14/2022   CL 97 (L) 07/14/2022   CO2 21 (L) 07/14/2022   BUN 12 07/14/2022   CREATININE 0.82 07/14/2022   EGFR 111 02/14/2022   CALCIUM 9.6 07/14/2022   PHOS 2.9 06/17/2022   PROT 8.3 (H) 07/14/2022   ALBUMIN 3.4 (L) 07/14/2022   LABGLOB 4.2 02/14/2022   AGRATIO 1.1 (L) 02/14/2022   BILITOT 1.6 (H) 07/14/2022   ALKPHOS 79 07/14/2022   AST 94 (H) 07/14/2022   ALT 32 07/14/2022   ANIONGAP 13 07/14/2022   Last lipids Lab Results  Component Value Date   CHOL 158 08/02/2021   HDL 71 08/02/2021   LDLCALC 73 08/02/2021   TRIG 97 07/12/2022   CHOLHDL 2.2 08/02/2021   Last hemoglobin A1c Lab Results  Component Value Date   HGBA1C 5.2 08/02/2021   Last thyroid functions Lab Results  Component Value  Date   TSH 2.216 11/23/2015   Last vitamin D No results found for: "25OHVITD2", "25OHVITD3", "VD25OH" Last vitamin B12 and Folate Lab Results  Component Value Date   VITAMINB12 443 12/04/2020   FOLATE 28.8 05/07/2021      The 10-year ASCVD risk score (Arnett DK, et al., 2019) is: 15.4%    Assessment & Plan:   Problem List Items Addressed This Visit       Cardiovascular and Mediastinum   Essential hypertension - Primary    Continue carvedilol and amlodipine but stop metoprolol        Digestive   Gastritis and gastroduodenitis    Gastroduodenitis causing vomiting and inflammation stop Carafate continue twice daily pantoprazole      Alcoholic steatohepatitis    Still present due to alcohol      Alcoholic pancreatitis    Give low-dose tramadol as needed for pain and again encouraged alcohol cessation which the patient will unlikely follow through with      Relevant Medications   traMADol (ULTRAM) 50 MG tablet   Chronic alcoholic gastritis without hemorrhage    As per gastritis gastroduodenitis        Musculoskeletal and Integument   RESOLVED: Cervical spine fracture (HCC)    Resolved        Genitourinary   Urinary tract infection    Has not picked up cephalosporin will reorder        Other   Transaminitis    Persist with alcohol use      40 minutes spent extra time needed for care coordination with case manager and the homeless shelter and medication adjustments Shan Levans, MD

## 2022-07-17 NOTE — Assessment & Plan Note (Signed)
Continue carvedilol and amlodipine but stop metoprolol

## 2022-07-17 NOTE — Telephone Encounter (Signed)
The patient had his hospital follow up appointment today at [facility] with Dr [name]

## 2022-07-17 NOTE — Assessment & Plan Note (Signed)
Give low-dose tramadol as needed for pain and again encouraged alcohol cessation which the patient will unlikely follow through with

## 2022-07-24 ENCOUNTER — Encounter: Payer: Self-pay | Admitting: Physician Assistant

## 2022-07-24 NOTE — Progress Notes (Signed)
Pt seen by Dr [name].  Has had 2 airplane bottles of vodka today.   His pill organizer is at least half full, but he says he   The Sunday pill organizer is missing, he says it got lost.   He was approved for disability, has gotten his first check.   Has dark red blood in his stool, diarrhea 3 times today so far. He is vomiting blood as well.  Has a fifth of vodka in his rollator.   Last ER visit was 05/15 for ETOH-induced pancreatitis.   PW will try to simplify his regimen, Leta Jungling says they can get him to take meds at breakfast and supper.  Theodore Demark, PA-C 07/24/2022 4:39 PM   Theodore Demark, PA-C 07/24/2022 4:36 PM

## 2022-08-17 ENCOUNTER — Encounter (HOSPITAL_COMMUNITY): Payer: Self-pay | Admitting: Emergency Medicine

## 2022-08-17 ENCOUNTER — Emergency Department (HOSPITAL_COMMUNITY): Payer: Medicaid Other

## 2022-08-17 ENCOUNTER — Other Ambulatory Visit (HOSPITAL_COMMUNITY): Payer: Medicaid Other

## 2022-08-17 ENCOUNTER — Inpatient Hospital Stay (HOSPITAL_COMMUNITY)
Admission: EM | Admit: 2022-08-17 | Discharge: 2022-08-22 | DRG: 871 | Discharge status: Against Medical Advice | Payer: Medicaid Other | Attending: Internal Medicine | Admitting: Internal Medicine

## 2022-08-17 ENCOUNTER — Other Ambulatory Visit: Payer: Self-pay

## 2022-08-17 ENCOUNTER — Observation Stay (HOSPITAL_COMMUNITY): Payer: Medicaid Other

## 2022-08-17 DIAGNOSIS — E860 Dehydration: Secondary | ICD-10-CM | POA: Diagnosis present

## 2022-08-17 DIAGNOSIS — E8729 Other acidosis: Secondary | ICD-10-CM | POA: Diagnosis not present

## 2022-08-17 DIAGNOSIS — K652 Spontaneous bacterial peritonitis: Secondary | ICD-10-CM | POA: Diagnosis present

## 2022-08-17 DIAGNOSIS — R188 Other ascites: Secondary | ICD-10-CM

## 2022-08-17 DIAGNOSIS — Z5329 Procedure and treatment not carried out because of patient's decision for other reasons: Secondary | ICD-10-CM | POA: Diagnosis not present

## 2022-08-17 DIAGNOSIS — D75838 Other thrombocytosis: Secondary | ICD-10-CM | POA: Diagnosis present

## 2022-08-17 DIAGNOSIS — E876 Hypokalemia: Secondary | ICD-10-CM | POA: Diagnosis present

## 2022-08-17 DIAGNOSIS — Z5900 Homelessness unspecified: Secondary | ICD-10-CM

## 2022-08-17 DIAGNOSIS — I10 Essential (primary) hypertension: Secondary | ICD-10-CM | POA: Diagnosis present

## 2022-08-17 DIAGNOSIS — R6510 Systemic inflammatory response syndrome (SIRS) of non-infectious origin without acute organ dysfunction: Secondary | ICD-10-CM

## 2022-08-17 DIAGNOSIS — D649 Anemia, unspecified: Secondary | ICD-10-CM | POA: Diagnosis present

## 2022-08-17 DIAGNOSIS — G894 Chronic pain syndrome: Secondary | ICD-10-CM | POA: Diagnosis present

## 2022-08-17 DIAGNOSIS — Z79899 Other long term (current) drug therapy: Secondary | ICD-10-CM

## 2022-08-17 DIAGNOSIS — K859 Acute pancreatitis without necrosis or infection, unspecified: Secondary | ICD-10-CM | POA: Diagnosis present

## 2022-08-17 DIAGNOSIS — K760 Fatty (change of) liver, not elsewhere classified: Secondary | ICD-10-CM | POA: Diagnosis present

## 2022-08-17 DIAGNOSIS — R6520 Severe sepsis without septic shock: Secondary | ICD-10-CM | POA: Diagnosis present

## 2022-08-17 DIAGNOSIS — E872 Acidosis, unspecified: Secondary | ICD-10-CM | POA: Diagnosis present

## 2022-08-17 DIAGNOSIS — K700 Alcoholic fatty liver: Secondary | ICD-10-CM | POA: Diagnosis present

## 2022-08-17 DIAGNOSIS — F1010 Alcohol abuse, uncomplicated: Secondary | ICD-10-CM | POA: Diagnosis present

## 2022-08-17 DIAGNOSIS — F17210 Nicotine dependence, cigarettes, uncomplicated: Secondary | ICD-10-CM | POA: Diagnosis present

## 2022-08-17 DIAGNOSIS — Z6821 Body mass index (BMI) 21.0-21.9, adult: Secondary | ICD-10-CM

## 2022-08-17 DIAGNOSIS — K860 Alcohol-induced chronic pancreatitis: Secondary | ICD-10-CM | POA: Diagnosis present

## 2022-08-17 DIAGNOSIS — Z8619 Personal history of other infectious and parasitic diseases: Secondary | ICD-10-CM

## 2022-08-17 DIAGNOSIS — D509 Iron deficiency anemia, unspecified: Secondary | ICD-10-CM | POA: Diagnosis present

## 2022-08-17 DIAGNOSIS — E441 Mild protein-calorie malnutrition: Secondary | ICD-10-CM | POA: Diagnosis present

## 2022-08-17 DIAGNOSIS — J9811 Atelectasis: Secondary | ICD-10-CM | POA: Diagnosis present

## 2022-08-17 DIAGNOSIS — K852 Alcohol induced acute pancreatitis without necrosis or infection: Secondary | ICD-10-CM | POA: Diagnosis present

## 2022-08-17 DIAGNOSIS — A419 Sepsis, unspecified organism: Principal | ICD-10-CM | POA: Diagnosis present

## 2022-08-17 DIAGNOSIS — Z720 Tobacco use: Secondary | ICD-10-CM | POA: Diagnosis present

## 2022-08-17 DIAGNOSIS — E871 Hypo-osmolality and hyponatremia: Secondary | ICD-10-CM | POA: Diagnosis present

## 2022-08-17 DIAGNOSIS — R569 Unspecified convulsions: Secondary | ICD-10-CM

## 2022-08-17 LAB — COMPREHENSIVE METABOLIC PANEL
ALT: 20 U/L (ref 0–44)
AST: 52 U/L — ABNORMAL HIGH (ref 15–41)
Albumin: 3.1 g/dL — ABNORMAL LOW (ref 3.5–5.0)
Alkaline Phosphatase: 110 U/L (ref 38–126)
Anion gap: 28 — ABNORMAL HIGH (ref 5–15)
BUN: 23 mg/dL — ABNORMAL HIGH (ref 6–20)
CO2: 17 mmol/L — ABNORMAL LOW (ref 22–32)
Calcium: 7.8 mg/dL — ABNORMAL LOW (ref 8.9–10.3)
Chloride: 80 mmol/L — ABNORMAL LOW (ref 98–111)
Creatinine, Ser: 1.17 mg/dL (ref 0.61–1.24)
GFR, Estimated: >60 mL/min (ref >60)
Glucose, Bld: 98 mg/dL (ref 70–99)
Potassium: 3.9 mmol/L (ref 3.5–5.1)
Sodium: 125 mmol/L — ABNORMAL LOW (ref 135–145)
Total Bilirubin: 2.6 mg/dL — ABNORMAL HIGH (ref 0.3–1.2)
Total Protein: 7.4 g/dL (ref 6.5–8.1)

## 2022-08-17 LAB — URINALYSIS, W/ REFLEX TO CULTURE (INFECTION SUSPECTED)
Bilirubin Urine: NEGATIVE
Glucose, UA: NEGATIVE mg/dL
Hgb urine dipstick: NEGATIVE
Ketones, ur: 20 mg/dL — AB
Nitrite: NEGATIVE
Protein, ur: 30 mg/dL — AB
Specific Gravity, Urine: 1.027 (ref 1.005–1.030)
pH: 5 (ref 5.0–8.0)

## 2022-08-17 LAB — CBC WITH DIFFERENTIAL/PLATELET
Abs Immature Granulocytes: 0 10*3/uL (ref 0.00–0.07)
Basophils Absolute: 0 10*3/uL (ref 0.0–0.1)
Basophils Relative: 0 %
Eosinophils Absolute: 0 10*3/uL (ref 0.0–0.5)
Eosinophils Relative: 0 %
HCT: 37.9 % — ABNORMAL LOW (ref 39.0–52.0)
Hemoglobin: 13.1 g/dL (ref 13.0–17.0)
Lymphocytes Relative: 5 %
Lymphs Abs: 0.8 10*3/uL (ref 0.7–4.0)
MCH: 30 pg (ref 26.0–34.0)
MCHC: 34.6 g/dL (ref 30.0–36.0)
MCV: 86.9 fL (ref 80.0–100.0)
Monocytes Absolute: 0.6 10*3/uL (ref 0.1–1.0)
Monocytes Relative: 4 %
Neutro Abs: 14.2 10*3/uL — ABNORMAL HIGH (ref 1.7–7.7)
Neutrophils Relative %: 91 %
Platelets: 589 10*3/uL — ABNORMAL HIGH (ref 150–400)
RBC: 4.36 MIL/uL (ref 4.22–5.81)
RDW: 21.3 % — ABNORMAL HIGH (ref 11.5–15.5)
WBC: 15.6 10*3/uL — ABNORMAL HIGH (ref 4.0–10.5)
nRBC: 0 % (ref 0.0–0.2)
nRBC: 0 /100 WBC

## 2022-08-17 LAB — BASIC METABOLIC PANEL
Anion gap: 21 — ABNORMAL HIGH (ref 5–15)
BUN: 21 mg/dL — ABNORMAL HIGH (ref 6–20)
CO2: 21 mmol/L — ABNORMAL LOW (ref 22–32)
Calcium: 7.3 mg/dL — ABNORMAL LOW (ref 8.9–10.3)
Chloride: 84 mmol/L — ABNORMAL LOW (ref 98–111)
Creatinine, Ser: 1 mg/dL (ref 0.61–1.24)
GFR, Estimated: >60 mL/min (ref >60)
Glucose, Bld: 89 mg/dL (ref 70–99)
Potassium: 3 mmol/L — ABNORMAL LOW (ref 3.5–5.1)
Sodium: 126 mmol/L — ABNORMAL LOW (ref 135–145)

## 2022-08-17 LAB — I-STAT CHEM 8, ED
BUN: 30 mg/dL — ABNORMAL HIGH (ref 6–20)
Calcium, Ion: 0.88 mmol/L — CL (ref 1.15–1.40)
Chloride: 82 mmol/L — ABNORMAL LOW (ref 98–111)
Creatinine, Ser: 1.2 mg/dL (ref 0.61–1.24)
Glucose, Bld: 100 mg/dL — ABNORMAL HIGH (ref 70–99)
HCT: 41 % (ref 39.0–52.0)
Hemoglobin: 13.9 g/dL (ref 13.0–17.0)
Potassium: 3.4 mmol/L — ABNORMAL LOW (ref 3.5–5.1)
Sodium: 125 mmol/L — ABNORMAL LOW (ref 135–145)
TCO2: 26 mmol/L (ref 22–32)

## 2022-08-17 LAB — RAPID URINE DRUG SCREEN, HOSP PERFORMED
Amphetamines: NOT DETECTED
Barbiturates: NOT DETECTED
Benzodiazepines: POSITIVE — AB
Cocaine: NOT DETECTED
Opiates: POSITIVE — AB
Tetrahydrocannabinol: NOT DETECTED

## 2022-08-17 LAB — CBC
HCT: 34 % — ABNORMAL LOW (ref 39.0–52.0)
Hemoglobin: 11.3 g/dL — ABNORMAL LOW (ref 13.0–17.0)
MCH: 29.3 pg (ref 26.0–34.0)
MCHC: 33.2 g/dL (ref 30.0–36.0)
MCV: 88.1 fL (ref 80.0–100.0)
Platelets: 469 10*3/uL — ABNORMAL HIGH (ref 150–400)
RBC: 3.86 MIL/uL — ABNORMAL LOW (ref 4.22–5.81)
RDW: 21.2 % — ABNORMAL HIGH (ref 11.5–15.5)
WBC: 14.2 10*3/uL — ABNORMAL HIGH (ref 4.0–10.5)
nRBC: 0 % (ref 0.0–0.2)

## 2022-08-17 LAB — SODIUM, URINE, RANDOM: Sodium, Ur: 11 mmol/L

## 2022-08-17 LAB — HEPATIC FUNCTION PANEL
ALT: 15 U/L (ref 0–44)
AST: 32 U/L (ref 15–41)
Albumin: 2.6 g/dL — ABNORMAL LOW (ref 3.5–5.0)
Alkaline Phosphatase: 93 U/L (ref 38–126)
Bilirubin, Direct: 0.5 mg/dL — ABNORMAL HIGH (ref 0.0–0.2)
Indirect Bilirubin: 0.8 mg/dL (ref 0.3–0.9)
Total Bilirubin: 1.3 mg/dL — ABNORMAL HIGH (ref 0.3–1.2)
Total Protein: 6.6 g/dL (ref 6.5–8.1)

## 2022-08-17 LAB — PROTIME-INR
INR: 1.1 (ref 0.8–1.2)
Prothrombin Time: 14.3 seconds (ref 11.4–15.2)

## 2022-08-17 LAB — LACTIC ACID, PLASMA
Lactic Acid, Venous: 3 mmol/L (ref 0.5–1.9)
Lactic Acid, Venous: 2.3 mmol/L (ref 0.5–1.9)
Lactic Acid, Venous: 2.6 mmol/L (ref 0.5–1.9)

## 2022-08-17 LAB — OSMOLALITY, URINE: Osmolality, Ur: 564 mOsm/kg (ref 300–900)

## 2022-08-17 LAB — URINE CULTURE

## 2022-08-17 LAB — ETHANOL: Alcohol, Ethyl (B): 97 mg/dL — ABNORMAL HIGH (ref <10)

## 2022-08-17 LAB — AMMONIA: Ammonia: 28 umol/L (ref 9–35)

## 2022-08-17 LAB — GLUCOSE, CAPILLARY: Glucose-Capillary: 108 mg/dL — ABNORMAL HIGH (ref 70–99)

## 2022-08-17 LAB — POC OCCULT BLOOD, ED: Fecal Occult Bld: NEGATIVE

## 2022-08-17 LAB — CREATININE, URINE, RANDOM: Creatinine, Urine: 75 mg/dL

## 2022-08-17 LAB — TSH: TSH: 1.859 u[IU]/mL (ref 0.350–4.500)

## 2022-08-17 LAB — PROCALCITONIN: Procalcitonin: 0.22 ng/mL

## 2022-08-17 LAB — BETA-HYDROXYBUTYRIC ACID: Beta-Hydroxybutyric Acid: 5.33 mmol/L — ABNORMAL HIGH (ref 0.05–0.27)

## 2022-08-17 LAB — MAGNESIUM: Magnesium: 1.2 mg/dL — ABNORMAL LOW (ref 1.7–2.4)

## 2022-08-17 LAB — PHOSPHORUS: Phosphorus: 4.3 mg/dL (ref 2.5–4.6)

## 2022-08-17 LAB — LIPASE, BLOOD: Lipase: 767 U/L — ABNORMAL HIGH (ref 11–51)

## 2022-08-17 LAB — APTT: aPTT: 36 seconds (ref 24–36)

## 2022-08-17 LAB — CK: Total CK: 68 U/L (ref 49–397)

## 2022-08-17 MED ORDER — ACETAMINOPHEN 650 MG RE SUPP: 650.0000 mg | Freq: Four times a day (QID) | RECTAL | Status: DC | PRN

## 2022-08-17 MED ORDER — LACTATED RINGERS IV SOLN: INTRAVENOUS | Status: DC

## 2022-08-17 MED ORDER — THIAMINE MONONITRATE 100 MG PO TABS
100.0000 mg | ORAL_TABLET | Freq: Every day | ORAL | Status: DC
  Administered 2022-08-18 – 2022-08-21 (×4): 100 mg via ORAL
  Filled 2022-08-17 (×4): qty 1

## 2022-08-17 MED ORDER — MORPHINE SULFATE (PF) 4 MG/ML IV SOLN
4.0000 mg | Freq: Once | INTRAVENOUS | Status: AC
  Administered 2022-08-17: 4 mg via INTRAVENOUS
  Filled 2022-08-17: qty 1

## 2022-08-17 MED ORDER — ALBUTEROL SULFATE (2.5 MG/3ML) 0.083% IN NEBU: 2.5000 mg | INHALATION_SOLUTION | RESPIRATORY_TRACT | Status: DC | PRN

## 2022-08-17 MED ORDER — PANTOPRAZOLE SODIUM 40 MG IV SOLR
40.0000 mg | Freq: Every day | INTRAVENOUS | Status: DC
  Administered 2022-08-17 – 2022-08-19 (×3): 40 mg via INTRAVENOUS
  Filled 2022-08-17 (×3): qty 10

## 2022-08-17 MED ORDER — SODIUM CHLORIDE 0.9 % IV SOLN: 2.0000 g | Freq: Once | INTRAVENOUS | Status: DC

## 2022-08-17 MED ORDER — ONDANSETRON HCL 4 MG/2ML IJ SOLN: 4.0000 mg | Freq: Four times a day (QID) | INTRAMUSCULAR | Status: DC | PRN

## 2022-08-17 MED ORDER — SODIUM CHLORIDE 0.9 % IV SOLN: INTRAVENOUS | Status: DC

## 2022-08-17 MED ORDER — METRONIDAZOLE 500 MG/100ML IV SOLN
500.0000 mg | Freq: Once | INTRAVENOUS | Status: AC
  Administered 2022-08-17: 500 mg via INTRAVENOUS
  Filled 2022-08-17: qty 100

## 2022-08-17 MED ORDER — ADULT MULTIVITAMIN W/MINERALS CH
1.0000 | ORAL_TABLET | Freq: Every day | ORAL | Status: DC
  Administered 2022-08-17 – 2022-08-21 (×5): 1 via ORAL
  Filled 2022-08-17 (×5): qty 1

## 2022-08-17 MED ORDER — NICOTINE 21 MG/24HR TD PT24
21.0000 mg | MEDICATED_PATCH | Freq: Every day | TRANSDERMAL | Status: DC
  Administered 2022-08-18 – 2022-08-21 (×4): 21 mg via TRANSDERMAL
  Filled 2022-08-17 (×4): qty 1

## 2022-08-17 MED ORDER — SODIUM CHLORIDE 0.9 % IV SOLN
2.0000 g | Freq: Three times a day (TID) | INTRAVENOUS | Status: DC
  Administered 2022-08-18 – 2022-08-22 (×12): 2 g via INTRAVENOUS
  Filled 2022-08-17 (×12): qty 12.5

## 2022-08-17 MED ORDER — ACETAMINOPHEN 325 MG PO TABS
650.0000 mg | ORAL_TABLET | Freq: Four times a day (QID) | ORAL | Status: DC | PRN
  Administered 2022-08-20 – 2022-08-21 (×4): 650 mg via ORAL
  Filled 2022-08-17 (×4): qty 2

## 2022-08-17 MED ORDER — ONDANSETRON HCL 4 MG PO TABS: 4.0000 mg | ORAL_TABLET | Freq: Four times a day (QID) | ORAL | Status: DC | PRN

## 2022-08-17 MED ORDER — IOHEXOL 350 MG/ML SOLN
75.0000 mL | Freq: Once | INTRAVENOUS | Status: AC | PRN
  Administered 2022-08-17: 75 mL via INTRAVENOUS

## 2022-08-17 MED ORDER — SODIUM CHLORIDE 0.9 % IV SOLN
2.0000 g | INTRAVENOUS | Status: DC
  Administered 2022-08-17: 2 g via INTRAVENOUS
  Filled 2022-08-17: qty 12.5

## 2022-08-17 MED ORDER — FOLIC ACID 1 MG PO TABS
1.0000 mg | ORAL_TABLET | Freq: Every day | ORAL | Status: DC
  Administered 2022-08-17 – 2022-08-21 (×5): 1 mg via ORAL
  Filled 2022-08-17 (×5): qty 1

## 2022-08-17 MED ORDER — POTASSIUM CHLORIDE 10 MEQ/100ML IV SOLN
10.0000 meq | INTRAVENOUS | Status: AC
  Administered 2022-08-18 (×4): 10 meq via INTRAVENOUS
  Filled 2022-08-17 (×4): qty 100

## 2022-08-17 MED ORDER — LORAZEPAM 1 MG PO TABS
1.0000 mg | ORAL_TABLET | ORAL | Status: AC | PRN
  Administered 2022-08-18 – 2022-08-19 (×2): 1 mg via ORAL
  Filled 2022-08-17 (×2): qty 1

## 2022-08-17 MED ORDER — METRONIDAZOLE 500 MG/100ML IV SOLN
500.0000 mg | Freq: Two times a day (BID) | INTRAVENOUS | Status: DC
  Administered 2022-08-18 – 2022-08-22 (×9): 500 mg via INTRAVENOUS
  Filled 2022-08-17 (×9): qty 100

## 2022-08-17 MED ORDER — GUAIFENESIN ER 600 MG PO TB12
600.0000 mg | ORAL_TABLET | Freq: Two times a day (BID) | ORAL | Status: DC
  Administered 2022-08-18: 600 mg via ORAL
  Filled 2022-08-17: qty 1

## 2022-08-17 MED ORDER — THIAMINE HCL 100 MG/ML IJ SOLN: 100.0000 mg | Freq: Every day | INTRAMUSCULAR | Status: DC

## 2022-08-17 MED ORDER — SODIUM CHLORIDE 0.9 % IV BOLUS
1000.0000 mL | Freq: Once | INTRAVENOUS | Status: AC
  Administered 2022-08-17: 1000 mL via INTRAVENOUS

## 2022-08-17 MED ORDER — MAGNESIUM SULFATE 2 GM/50ML IV SOLN
2.0000 g | Freq: Once | INTRAVENOUS | Status: AC
  Administered 2022-08-18: 2 g via INTRAVENOUS
  Filled 2022-08-17: qty 50

## 2022-08-17 MED ORDER — PANCRELIPASE (LIP-PROT-AMYL) 12000-38000 UNITS PO CPEP
12000.0000 [IU] | ORAL_CAPSULE | Freq: Three times a day (TID) | ORAL | Status: DC
  Administered 2022-08-18: 12000 [IU] via ORAL
  Filled 2022-08-17 (×2): qty 1

## 2022-08-17 MED ORDER — LORAZEPAM 2 MG/ML IJ SOLN
1.0000 mg | INTRAMUSCULAR | Status: AC | PRN
  Administered 2022-08-17 – 2022-08-19 (×2): 2 mg via INTRAVENOUS
  Filled 2022-08-17 (×2): qty 1

## 2022-08-17 MED ORDER — THIAMINE HCL 100 MG/ML IJ SOLN
100.0000 mg | Freq: Once | INTRAMUSCULAR | Status: AC
  Administered 2022-08-17: 100 mg via INTRAVENOUS
  Filled 2022-08-17: qty 2

## 2022-08-17 MED ORDER — HYDROMORPHONE HCL 1 MG/ML IJ SOLN
0.5000 mg | INTRAMUSCULAR | Status: DC | PRN
  Administered 2022-08-17: 1 mg via INTRAVENOUS
  Filled 2022-08-17: qty 1

## 2022-08-17 MED ORDER — ONDANSETRON HCL 4 MG/2ML IJ SOLN
4.0000 mg | Freq: Once | INTRAMUSCULAR | Status: AC
  Administered 2022-08-17: 4 mg via INTRAVENOUS
  Filled 2022-08-17: qty 2

## 2022-08-17 NOTE — ED Notes (Signed)
EDP made aware of lactic acid of 3.0

## 2022-08-17 NOTE — ED Notes (Signed)
Patient transported to CT 

## 2022-08-17 NOTE — Assessment & Plan Note (Signed)
Will need father evaluation obtain IR consult for paracentesis Started on IV antibiotics cefepime and metronidazole in the ER will continue monitoring in stepdown

## 2022-08-17 NOTE — Assessment & Plan Note (Signed)
-   Spoke about importance of quitting spent 5 minutes discussing options for treatment, prior attempts at quitting, and dangers of smoking  -At this point patient is     interested in quitting  - order nicotine patch   - nursing tobacco cessation protocol  

## 2022-08-17 NOTE — Progress Notes (Signed)
Pharmacy Antibiotic Note  Samuel Blevins is a 57 y.o. male for which pharmacy has been consulted for cefepime dosing for  IAI .  Estimated Creatinine Clearance: 71 mL/min (by C-G formula based on SCr of 1.2 mg/dL). WBC 15.6; LA 3; T 97.7; HR 136; RR 29  Plan: Metronidazole per MD Cefepime 2g q8hr  Monitor WBC, fever, renal function, cultures De-escalate when able  Height: 6\' 1"  (185.4 cm) Weight: 74.8 kg (165 lb) IBW/kg (Calculated) : 79.9  Temp (24hrs), Avg:97.7 F (36.5 C), Min:97.7 F (36.5 C), Max:97.7 F (36.5 C)  Recent Labs  Lab 08/17/22 1623 08/17/22 1717 08/17/22 1746  WBC 15.6*  --   --   CREATININE 1.17  --  1.20  LATICACIDVEN  --  3.0*  --     Estimated Creatinine Clearance: 71 mL/min (by C-G formula based on SCr of 1.2 mg/dL).    No Known Allergies  Microbiology results: Pending  Thank you for allowing pharmacy to be a part of this patient's care.  Delmar Landau, PharmD, BCPS 08/17/2022 6:37 PM ED Clinical Pharmacist -  [phone]

## 2022-08-17 NOTE — ED Triage Notes (Signed)
Per [facility] pt coming from home c/o upper abdominal pain since Wednesday. Reports hx of same r/t alcohol intake. Admits to alcohol intake last night and this morning. Emesis and abdominal distention today. Patient adds he fell Tuesday and having pain to right shoulder.

## 2022-08-17 NOTE — Assessment & Plan Note (Signed)
Will replace Also noted hypomagnesemia will replace as well check phosphate level Replace and recheck in a.m. Administer thiamine

## 2022-08-17 NOTE — ED Provider Notes (Signed)
[place] [facility]ERG[facility] DEPARTMENT AT [facility] Provider Note  CSN: [id number] Arrival date & time: 08/17/22 1612  Chief Complaint(s) Abdominal Pain and Emesis  HPI Samuel Blevins is a 57 y.o. male history of alcohol use disorder, chronic pancreatitis presenting to the emergency department with abdominal pain.  Patient reports that pain is everywhere.  Has been going on for the past 2 days.  No fevers.  Reports associated nausea and vomiting for the past 2 days.  Endorses ongoing alcohol use.  No fevers or chills.  Reports he had an episode of blood in his stool but no melena.  No shortness of breath, chest pain.  Reports some subjective fevers and chills.   Past Medical History Past Medical History:  Diagnosis Date   Alcohol withdrawal syndrome with complication (HCC)    Alcoholism (HCC)    Elevated AST (SGOT)    Gastrointestinal hemorrhage    Homeless    Hypertension    Pancreatic insufficiency    takes Creon   Symptomatic anemia 11/23/2015   Thrombocytopenia (HCC) 05/09/2021   Patient Active Problem List   Diagnosis Date Noted   Pancreatitis 08/17/2022   Urinary tract infection 07/17/2022   Abnormal finding on GI tract imaging 07/13/2022   Abdominal pain, epigastric 07/11/2022   Acute kidney injury (nontraumatic) (HCC) 06/10/2022   Fall 04/16/2022   Alcohol withdrawal with inpatient treatment with perceptual disturbance (HCC) 04/13/2022   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   Portal vein thrombosis 08/03/2021   Alcoholic pancreatitis 08/03/2021   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   Anxiety and depression 07/04/2021   Homeless 07/04/2021   Osteoarthritis 07/04/2021   Thrombocytopenia (HCC) 05/09/2021   Pancreatic insufficiency 05/08/2021   Chronic alcohol abuse 05/07/2021   Alcoholic steatohepatitis 12/07/2020   Iron deficiency anemia due to chronic blood loss 12/07/2020   Seizure (HCC) 12/04/2020   Alcohol  withdrawal (HCC) 09/16/2020   Chronic pain syndrome 12/28/2019   Pancreatic pseudocyst 10/28/2019   Gastritis and gastroduodenitis    AVM (arteriovenous malformation) of small bowel, acquired    Transaminitis    Alcohol dependence syndrome (HCC) 06/21/2019   Chronic anemia 11/23/2015   Tobacco abuse 03/17/2014   Essential hypertension 03/17/2014   Home Medication(s) Prior to Admission medications   Medication Sig Start Date End Date Taking? Authorizing Provider  acetaminophen (TYLENOL) 325 MG tablet Take 2 tablets (650 mg total) by mouth every 6 (six) hours as needed for fever, moderate pain, mild pain or headache. Patient not taking: Reported on 07/11/2022 12/12/20   Belva Agee, MD  carvedilol (COREG) 3.125 MG tablet Take 1 tablet (3.125 mg total) by mouth 2 (two) times daily with a meal. Patient not taking: Reported on 07/11/2022 07/10/22   [name], MD  folic acid (FOLVITE) 1 MG tablet Take 1 tablet (1 mg total) by mouth daily. 07/14/22   Rolly Salter, MD  Iron, Ferrous Sulfate, 325 (65 Fe) MG TABS Take 1 tablet ( 325 mg) by mouth daily. Patient not taking: Reported on 07/11/2022 05/24/22   [name], MD  lipase/protease/amylase (CREON) 12000-38000 units CPEP capsule Take 1 capsule (12,000 Units total) by mouth 3 (three) times daily before meals. 07/14/22   Rolly Salter, MD  magnesium oxide (MAG-OX) 400 MG tablet Take 1 tablet (400 mg total) by mouth in the morning and at bedtime. 07/14/22   Rolly Salter, MD  Multiple Vitamin (MULTIVITAMIN WITH MINERALS) TABS tablet Take 1 tablet by mouth daily. 07/14/22  Rolly Salter, MD  nicotine (NICODERM CQ - DOSED IN MG/24 HOURS) 14 mg/24hr patch Place 1 patch (14 mg total) onto the skin daily. 07/14/22   Rolly Salter, MD  pantoprazole (PROTONIX) 40 MG tablet Take 1 tablet (40 mg total) by mouth 2 (two) times daily before a meal. 07/14/22   Rolly Salter, MD  polyethylene glycol (MIRALAX / GLYCOLAX) 17 g packet  Take 17 g by mouth daily as needed for mild constipation or moderate constipation. 07/14/22   Rolly Salter, MD  thiamine (VITAMIN B-1) 100 MG tablet Take 1 tablet (100 mg total) by mouth daily. 07/14/22   Rolly Salter, MD  traMADol (ULTRAM) 50 MG tablet Take 1 tablet (50 mg total) by mouth every 8 (eight) hours as needed for severe pain. 07/17/22   [name], MD                                                                                                                                    Past Surgical History Past Surgical History:  Procedure Laterality Date   BIOPSY  06/23/2019   Procedure: BIOPSY;  Surgeon: Shellia Cleverly, DO;  Location: [facility] ENDOSCOPY;  Service: Gastroenterology;;   BIOPSY  12/12/2020   Procedure: BIOPSY;  Surgeon: Lynann Bologna, MD;  Location: [facility] ENDOSCOPY;  Service: Endoscopy;;   BIOPSY  08/04/2021   Procedure: BIOPSY;  Surgeon: Jenel Lucks, MD;  Location: [facility] ENDOSCOPY;  Service: Gastroenterology;;   ESOPHAGOGASTRODUODENOSCOPY N/A 08/04/2021   Procedure: ESOPHAGOGASTRODUODENOSCOPY (EGD);  Surgeon: Jenel Lucks, MD;  Location: [facility] ENDOSCOPY;  Service: Gastroenterology;  Laterality: N/A;   ESOPHAGOGASTRODUODENOSCOPY (EGD) WITH PROPOFOL N/A 06/23/2019   Procedure: ESOPHAGOGASTRODUODENOSCOPY (EGD) WITH PROPOFOL;  Surgeon: Shellia Cleverly, DO;  Location: [facility] ENDOSCOPY;  Service: Gastroenterology;  Laterality: N/A;   ESOPHAGOGASTRODUODENOSCOPY (EGD) WITH PROPOFOL N/A 12/12/2020   Procedure: ESOPHAGOGASTRODUODENOSCOPY (EGD) WITH PROPOFOL;  Surgeon: Lynann Bologna, MD;  Location: [facility] ENDOSCOPY;  Service: Endoscopy;  Laterality: N/A;   HOT HEMOSTASIS N/A 06/23/2019   Procedure: HOT HEMOSTASIS (ARGON PLASMA COAGULATION/BICAP);  Surgeon: Shellia Cleverly, DO;  Location: [facility] ENDOSCOPY;  Service: Gastroenterology;  Laterality: N/A;   Family History Family History  Problem Relation Age of Onset   Diabetes Mellitus II Neg Hx    Colon cancer Neg Hx     Stomach cancer Neg Hx    Pancreatic cancer Neg Hx     Social History Social History   Tobacco Use   Smoking status: Every Day    Packs/day: 0.50    Years: 30.00    Additional pack years: 0.00    Total pack years: 15.00    Types: Cigarettes   Smokeless tobacco: Never  Vaping Use   Vaping Use: Never used  Substance Use Topics   Alcohol use: Yes    Alcohol/week: 28.0 standard drinks of alcohol    Types: 28 Shots of liquor per week  Drug use: Never   Allergies Patient has no known allergies.  Review of Systems Review of Systems  All other systems reviewed and are negative.   Physical Exam Vital Signs  I have reviewed the triage vital signs BP (!) 151/93   Pulse (!) 113   Temp (!) 97.5 F (36.4 C) (Oral)   Resp 20   Ht 6\' 1"  (1.854 m)   Wt 74.8 kg   SpO2 94%   BMI 21.77 kg/m  Physical Exam Vitals and nursing note reviewed.  Constitutional:      General: He is not in acute distress.    Appearance: Normal appearance.  HENT:     Mouth/Throat:     Mouth: Mucous membranes are dry.  Eyes:     Conjunctiva/sclera: Conjunctivae normal.  Cardiovascular:     Rate and Rhythm: Regular rhythm. Tachycardia present.  Pulmonary:     Effort: Pulmonary effort is normal. No respiratory distress.     Breath sounds: Normal breath sounds.  Abdominal:     General: There is distension (mildly).     Palpations: Abdomen is soft.     Tenderness: There is generalized abdominal tenderness.  Musculoskeletal:     Right lower leg: No edema.     Left lower leg: No edema.  Skin:    General: Skin is warm and dry.     Capillary Refill: Capillary refill takes less than 2 seconds.  Neurological:     Mental Status: He is alert and oriented to person, place, and time. Mental status is at baseline.  Psychiatric:        Mood and Affect: Mood normal.        Behavior: Behavior normal.     ED Results and Treatments Labs (all labs ordered are listed, but only abnormal results are  displayed) Labs Reviewed  COMPREHENSIVE METABOLIC PANEL - Abnormal; Notable for the following components:      Result Value   Sodium 125 (*)    Chloride 80 (*)    CO2 17 (*)    BUN 23 (*)    Calcium 7.8 (*)    Albumin 3.1 (*)    AST 52 (*)    Total Bilirubin 2.6 (*)    Anion gap 28 (*)    All other components within normal limits  CBC WITH DIFFERENTIAL/PLATELET - Abnormal; Notable for the following components:   WBC 15.6 (*)    HCT 37.9 (*)    RDW 21.3 (*)    Platelets 589 (*)    Neutro Abs 14.2 (*)    All other components within normal limits  LIPASE, BLOOD - Abnormal; Notable for the following components:   Lipase 767 (*)    All other components within normal limits  URINALYSIS, W/ REFLEX TO CULTURE (INFECTION SUSPECTED) - Abnormal; Notable for the following components:   Color, Urine AMBER (*)    APPearance HAZY (*)    Ketones, ur 20 (*)    Protein, ur 30 (*)    Leukocytes,Ua TRACE (*)    Bacteria, UA FEW (*)    All other components within normal limits  LACTIC ACID, PLASMA - Abnormal; Notable for the following components:   Lactic Acid, Venous 3.0 (*)    All other components within normal limits  LACTIC ACID, PLASMA - Abnormal; Notable for the following components:   Lactic Acid, Venous 2.3 (*)    All other components within normal limits  BETA-HYDROXYBUTYRIC ACID - Abnormal; Notable for the following components:   Beta-Hydroxybutyric  Acid 5.33 (*)    All other components within normal limits  I-STAT CHEM 8, ED - Abnormal; Notable for the following components:   Sodium 125 (*)    Potassium 3.4 (*)    Chloride 82 (*)    BUN 30 (*)    Glucose, Bld 100 (*)    Calcium, Ion 0.88 (*)    All other components within normal limits  URINE CULTURE  CULTURE, BLOOD (ROUTINE X 2)  CULTURE, BLOOD (ROUTINE X 2)  OSMOLALITY, URINE  CREATININE, URINE, RANDOM  SODIUM, URINE, RANDOM  TSH  PREALBUMIN  RAPID URINE DRUG SCREEN, HOSP PERFORMED  ETHANOL  AMMONIA  PROTIME-INR   CBC WITH DIFFERENTIAL/PLATELET  LACTIC ACID, PLASMA  LACTIC ACID, PLASMA  APTT  COMPREHENSIVE METABOLIC PANEL  PROCALCITONIN  OSMOLALITY  BASIC METABOLIC PANEL  CBC  CBC  CK  HEPATIC FUNCTION PANEL  POTASSIUM  MAGNESIUM  PHOSPHORUS  POC OCCULT BLOOD, ED                                                                                                                          Radiology DG CHEST PORT 1 VIEW  Result Date: 08/17/2022 CLINICAL DATA:  Upper abdominal pain. EXAM: PORTABLE CHEST 1 VIEW COMPARISON:  Jul 11, 2022 FINDINGS: The heart size and mediastinal contours are within normal limits. Mild, stable atelectatic changes are seen within the bilateral lung bases. There is no evidence of focal consolidation, pleural effusion or pneumothorax. The visualized skeletal structures are unremarkable. IMPRESSION: Mild, stable bibasilar atelectasis. Electronically Signed   By: Aram Candela M.D.   On: 08/17/2022 20:13   CT ABDOMEN PELVIS W CONTRAST  Result Date: 08/17/2022 CLINICAL DATA:  Three day history of upper abdominal pain associated with emesis and abdominal distention EXAM: CT ABDOMEN AND PELVIS WITH CONTRAST TECHNIQUE: Multidetector CT imaging of the abdomen and pelvis was performed using the standard protocol following bolus administration of intravenous contrast. RADIATION DOSE REDUCTION: This exam was performed according to the departmental dose-optimization program which includes automated exposure control, adjustment of the mA and/or kV according to patient size and/or use of iterative reconstruction technique. CONTRAST:  75mL OMNIPAQUE IOHEXOL 350 MG/ML SOLN COMPARISON:  CT abdomen and pelvis dated 07/11/2022 FINDINGS: Lower chest: No focal consolidation or pulmonary nodule in the lung bases. No pleural effusion or pneumothorax demonstrated. Partially imaged heart size is normal. Hepatobiliary: Diffuse parenchymal hypoattenuation can be seen with hepatic steatosis. No intra or  extrahepatic biliary ductal dilation. Normal gallbladder. Pancreas: Decreased mild peripancreatic stranding surrounding the head and uncinate process. No focal lesions or main ductal dilation. Spleen: Normal in size without focal abnormality. Adrenals/Urinary Tract: No adrenal nodules. No suspicious renal mass, calculi or hydronephrosis. No focal bladder wall thickening. Stomach/Bowel: Normal appearance of the stomach. No evidence of bowel wall thickening, distention, or inflammatory changes. Normal appendix. Vascular/Lymphatic: Aortic atherosclerosis. No enlarged abdominal or pelvic lymph nodes. Reproductive: Enlargement of the prostate with median lobe hypertrophy. Other: New moderate volume ascites.  No free air or fluid collection. Musculoskeletal: No acute or abnormal lytic or blastic osseous lesions. Postsurgical changes of the paraumbilical region. Degenerative changes of the lumbar spine, most pronounced at L5-S1. IMPRESSION: 1. New moderate volume ascites. 2. Decreased mild peripancreatic stranding surrounding the head and uncinate process, which may represent improving pancreatitis. 3. Hepatic steatosis. 4. Enlargement of the prostate with median lobe hypertrophy. 5.  Aortic Atherosclerosis (ICD10-I70.0). Electronically Signed   By: Agustin Cree M.D.   On: 08/17/2022 18:55    Pertinent labs & imaging results that were available during my care of the patient were reviewed by me and considered in my medical decision making (see MDM for details).  Medications Ordered in ED Medications  lactated ringers infusion ( Intravenous New Bag/Given 08/17/22 2004)  thiamine (VITAMIN B1) injection 100 mg (has no administration in time range)  LORazepam (ATIVAN) tablet 1-4 mg (has no administration in time range)    Or  LORazepam (ATIVAN) injection 1-4 mg (has no administration in time range)  thiamine (VITAMIN B1) tablet 100 mg (has no administration in time range)    Or  thiamine (VITAMIN B1) injection 100 mg  (has no administration in time range)  folic acid (FOLVITE) tablet 1 mg (has no administration in time range)  multivitamin with minerals tablet 1 tablet (has no administration in time range)  metroNIDAZOLE (FLAGYL) IVPB 500 mg (has no administration in time range)  ceFEPIme (MAXIPIME) 2 g in sodium chloride 0.9 % 100 mL IVPB (has no administration in time range)  HYDROmorphone (DILAUDID) injection 0.5-1 mg (has no administration in time range)  sodium chloride 0.9 % bolus 1,000 mL (0 mLs Intravenous Stopped 08/17/22 1941)  morphine (PF) 4 MG/ML injection 4 mg (4 mg Intravenous Given 08/17/22 1720)  ondansetron (ZOFRAN) injection 4 mg (4 mg Intravenous Given 08/17/22 1720)  iohexol (OMNIPAQUE) 350 MG/ML injection 75 mL (75 mLs Intravenous Contrast Given 08/17/22 1834)  metroNIDAZOLE (FLAGYL) IVPB 500 mg (0 mg Intravenous Stopped 08/17/22 1959)                                                                                                                                     Procedures .Critical Care  Performed by: Lonell Grandchild, MD Authorized by: Lonell Grandchild, MD   Critical care provider statement:    Critical care time (minutes):  30   Critical care was necessary to treat or prevent imminent or life-threatening deterioration of the following conditions:  Sepsis and metabolic crisis   Critical care was time spent personally by me on the following activities:  Development of treatment plan with patient or surrogate, discussions with consultants, evaluation of patient's response to treatment, examination of patient, ordering and review of laboratory studies, ordering and review of radiographic studies, ordering and performing treatments and interventions, pulse oximetry, re-evaluation of patient's condition and review of old charts   Care discussed with: admitting provider     (  including critical care time)  Medical Decision Making / ED Course   MDM:  58 year old male presenting  to the emergency department with abdominal pain.  Patient appears dehydrated, vitals notable for tachycardia, mild tachypnea.  No hypotension or hypoxia.  Physical exam with diffuse abdominal tenderness.  Review of chart reveals patient recent admitted for alcoholic pancreatitis.  He reports that this episode feels similar.  Will check labs including lipase.  He endorses ongoing alcohol use.  Also obtain CT scan given significant tenderness.  Differential includes other acute process such as pancreatic pseudocyst, infection such as abscess, cholecystitis, appendicitis, diverticulitis.  His abdomen appears mildly distended, if he has evidence of ascitic fluid on CT scan may need diagnostic paracentesis as well.  Will reassess.  Will give IV fluids, pain control.  Patient not febrile but given vital signs obtain blood cultures.  Clinical Course as of 08/17/22 2027  Sat Aug 17, 2022  1943 Workup notable for hyponatremia, elevated lipase, leukocytosis.  Urinalysis not very concerning for infection but possible.  CT scan with progression of previous episode of pancreatitis.  Also with new ascites.  Performed bedside ultrasound but no ascitic pocket large enough to safely perform paracentesis.  Discussed with Dr. Adela Glimpse who will admit the patient for further management. [WS]  1952 Rectal exam with some brown stool, no melena or gross blood, chaperoned by NT [WS]    Clinical Course User Index [WS] Lonell Grandchild, MD     Additional history obtained: -Additional history obtained from ems -External records from outside source obtained and reviewed including: Chart review including previous notes, labs, imaging, consultation notes including prior admission for pancreatitis   Lab Tests: -I ordered, reviewed, and interpreted labs.   The pertinent results include:   Labs Reviewed  COMPREHENSIVE METABOLIC PANEL - Abnormal; Notable for the following components:      Result Value   Sodium 125 (*)     Chloride 80 (*)    CO2 17 (*)    BUN 23 (*)    Calcium 7.8 (*)    Albumin 3.1 (*)    AST 52 (*)    Total Bilirubin 2.6 (*)    Anion gap 28 (*)    All other components within normal limits  CBC WITH DIFFERENTIAL/PLATELET - Abnormal; Notable for the following components:   WBC 15.6 (*)    HCT 37.9 (*)    RDW 21.3 (*)    Platelets 589 (*)    Neutro Abs 14.2 (*)    All other components within normal limits  LIPASE, BLOOD - Abnormal; Notable for the following components:   Lipase 767 (*)    All other components within normal limits  URINALYSIS, W/ REFLEX TO CULTURE (INFECTION SUSPECTED) - Abnormal; Notable for the following components:   Color, Urine AMBER (*)    APPearance HAZY (*)    Ketones, ur 20 (*)    Protein, ur 30 (*)    Leukocytes,Ua TRACE (*)    Bacteria, UA FEW (*)    All other components within normal limits  LACTIC ACID, PLASMA - Abnormal; Notable for the following components:   Lactic Acid, Venous 3.0 (*)    All other components within normal limits  LACTIC ACID, PLASMA - Abnormal; Notable for the following components:   Lactic Acid, Venous 2.3 (*)    All other components within normal limits  BETA-HYDROXYBUTYRIC ACID - Abnormal; Notable for the following components:   Beta-Hydroxybutyric Acid 5.33 (*)    All other components  within normal limits  I-STAT CHEM 8, ED - Abnormal; Notable for the following components:   Sodium 125 (*)    Potassium 3.4 (*)    Chloride 82 (*)    BUN 30 (*)    Glucose, Bld 100 (*)    Calcium, Ion 0.88 (*)    All other components within normal limits  URINE CULTURE  CULTURE, BLOOD (ROUTINE X 2)  CULTURE, BLOOD (ROUTINE X 2)  OSMOLALITY, URINE  CREATININE, URINE, RANDOM  SODIUM, URINE, RANDOM  TSH  PREALBUMIN  RAPID URINE DRUG SCREEN, HOSP PERFORMED  ETHANOL  AMMONIA  PROTIME-INR  CBC WITH DIFFERENTIAL/PLATELET  LACTIC ACID, PLASMA  LACTIC ACID, PLASMA  APTT  COMPREHENSIVE METABOLIC PANEL  PROCALCITONIN  OSMOLALITY   BASIC METABOLIC PANEL  CBC  CBC  CK  HEPATIC FUNCTION PANEL  POTASSIUM  MAGNESIUM  PHOSPHORUS  POC OCCULT BLOOD, ED    Notable for lactic acidosis, elevated beta hydroxybutyrate likely alcoholic ketoacidosis, hyponatremia, leukocytosis  EKG   EKG Interpretation  Date/Time:  Saturday August 17 2022 16:18:46 EDT Ventricular Rate:  134 PR Interval:  126 QRS Duration: 84 QT Interval:  316 QTC Calculation: 471 R Axis:   79 Text Interpretation: Sinus tachycardia with occasional Premature ventricular complexes Possible Left atrial enlargement Nonspecific ST abnormality Abnormal ECG Confirmed by Alvino Blood ([id number]) on 08/17/2022 5:08:16 PM         Imaging Studies ordered: I ordered imaging studies including CT abd On my interpretation imaging demonstrates signs of pancreatitis I independently visualized and interpreted imaging. I agree with the radiologist interpretation   Medicines ordered and prescription drug management: Meds ordered this encounter  Medications   sodium chloride 0.9 % bolus 1,000 mL   morphine (PF) 4 MG/ML injection 4 mg   ondansetron (ZOFRAN) injection 4 mg   iohexol (OMNIPAQUE) 350 MG/ML injection 75 mL   lactated ringers infusion   DISCONTD: ceFEPIme (MAXIPIME) 2 g in sodium chloride 0.9 % 100 mL IVPB    Order Specific Question:   Antibiotic Indication:    Answer:   Other Indication (list below)    Order Specific Question:   Other Indication:    Answer:   Intra-abdominal   metroNIDAZOLE (FLAGYL) IVPB 500 mg    Order Specific Question:   Antibiotic Indication:    Answer:   Intra-abdominal Infection   DISCONTD: ceFEPIme (MAXIPIME) 2 g in sodium chloride 0.9 % 100 mL IVPB    Order Specific Question:   Antibiotic Indication:    Answer:   Other Indication (list below)    Order Specific Question:   Other Indication:    Answer:   IAI   thiamine (VITAMIN B1) injection 100 mg   OR Linked Order Group    LORazepam (ATIVAN) tablet 1-4 mg     Order  Specific Question:   CIWA-AR < 5 =     Answer:   0 mg     Order Specific Question:   CIWA-AR 5 -10 =     Answer:   1 mg     Order Specific Question:   CIWA-AR 11 -15 =     Answer:   2 mg     Order Specific Question:   CIWA-AR 16 -20 =     Answer:   3 mg     Order Specific Question:   CIWA-AR 16 -20 =     Answer:   Recheck CIWA-AR in 1 hour; if > 20 notify MD     Order Specific Question:  CIWA-AR > 20 =     Answer:   4 mg     Order Specific Question:   CIWA-AR > 20 =     Answer:   Call Rapid Response    LORazepam (ATIVAN) injection 1-4 mg     Order Specific Question:   CIWA-AR < 5 =     Answer:   0 mg     Order Specific Question:   CIWA-AR 5 -10 =     Answer:   1 mg     Order Specific Question:   CIWA-AR 11 -15 =     Answer:   2 mg     Order Specific Question:   CIWA-AR 16 -20 =     Answer:   3 mg     Order Specific Question:   CIWA-AR 16 -20 =     Answer:   Recheck CIWA-AR in 1 hour; if > 20 notify MD     Order Specific Question:   CIWA-AR > 20 =     Answer:   4 mg     Order Specific Question:   CIWA-AR > 20 =     Answer:   Call Rapid Response   OR Linked Order Group    thiamine (VITAMIN B1) tablet 100 mg    thiamine (VITAMIN B1) injection 100 mg   folic acid (FOLVITE) tablet 1 mg   multivitamin with minerals tablet 1 tablet   metroNIDAZOLE (FLAGYL) IVPB 500 mg    Order Specific Question:   Antibiotic Indication:    Answer:   Intra-abdominal Infection   ceFEPIme (MAXIPIME) 2 g in sodium chloride 0.9 % 100 mL IVPB    Order Specific Question:   Antibiotic Indication:    Answer:   Other Indication (list below)    Order Specific Question:   Other Indication:    Answer:   IAI   HYDROmorphone (DILAUDID) injection 0.5-1 mg    -I have reviewed the patients home medicines and have made adjustments as needed   Consultations Obtained: I requested consultation with the hospitalis,  and discussed lab and imaging findings as well as pertinent plan - they recommend:  admission   Cardiac Monitoring: The patient was maintained on a cardiac monitor.  I personally viewed and interpreted the cardiac monitored which showed an underlying rhythm of: NSR  Social Determinants of Health:  Diagnosis or treatment significantly limited by social determinants of health: alcohol use and homelessness   Reevaluation: After the interventions noted above, I reevaluated the patient and found that their symptoms have improved  Co morbidities that complicate the patient evaluation  Past Medical History:  Diagnosis Date   Alcohol withdrawal syndrome with complication (HCC)    Alcoholism (HCC)    Elevated AST (SGOT)    Gastrointestinal hemorrhage    Homeless    Hypertension    Pancreatic insufficiency    takes Creon   Symptomatic anemia 11/23/2015   Thrombocytopenia (HCC) 05/09/2021      Dispostion: Disposition decision including need for hospitalization was considered, and patient admitted to the hospital.    Final Clinical Impression(s) / ED Diagnoses Final diagnoses:  Severe acute pancreatitis  High anion gap metabolic acidosis  SIRS (systemic inflammatory response syndrome) (HCC)  Other ascites     This chart was dictated using voice recognition software.  Despite best efforts to proofread,  errors can occur which can change the documentation meaning.    Lonell Grandchild, MD 08/17/22 2028

## 2022-08-17 NOTE — Assessment & Plan Note (Addendum)
CIWA protocol  Pt is interested in quitting

## 2022-08-17 NOTE — ED Notes (Signed)
ED TO INPATIENT HANDOFF REPORT  ED Nurse Name and Phone #:   S Name/Age/Gender Samuel Blevins 57 y.o. male Room/Bed: 025C/025C  Code Status   Code Status: Prior  Home/SNF/Other Home Patient oriented to: self, place, time, and situation Is this baseline? Yes   Triage Complete: Triage complete  Chief Complaint Pancreatitis [K85.90]  Triage Note Per [facility] pt coming from home c/o upper abdominal pain since Wednesday. Reports hx of same r/t alcohol intake. Admits to alcohol intake last night and this morning. Emesis and abdominal distention today. Patient adds he fell Tuesday and having pain to right shoulder.    Allergies No Known Allergies  Level of Care/Admitting Diagnosis ED Disposition     ED Disposition  Admit   Condition  --   Comment  Hospital Area: [facility] [[id number]]  Level of Care: Progressive [102]  Admit to Progressive based on following criteria: NEUROLOGICAL AND NEUROSURGICAL complex patients with significant risk of instability, who do not meet ICU criteria, yet require close observation or frequent assessment (< / = every 2 - 4 hours) with medical / nursing intervention.  Admit to Progressive based on following criteria: MULTISYSTEM THREATS such as stable sepsis, metabolic/electrolyte imbalance with or without encephalopathy that is responding to early treatment.  May place patient in observation at [facility] or [facility] if equivalent level of care is available:: No  Covid Evaluation: Asymptomatic - no recent exposure (last 10 days) testing not required  Diagnosis: Pancreatitis [607371]  Admitting Physician: Therisa Doyne [[id number]]  Attending Physician: Therisa Doyne [[id number]]          B Medical/Surgery History Past Medical History:  Diagnosis Date   Alcohol withdrawal syndrome with complication (HCC)    Alcoholism (HCC)    Elevated AST (SGOT)    Gastrointestinal hemorrhage    Homeless    Hypertension     Pancreatic insufficiency    takes Creon   Symptomatic anemia 11/23/2015   Thrombocytopenia (HCC) 05/09/2021   Past Surgical History:  Procedure Laterality Date   BIOPSY  06/23/2019   Procedure: BIOPSY;  Surgeon: Shellia Cleverly, DO;  Location: [facility] ENDOSCOPY;  Service: Gastroenterology;;   BIOPSY  12/12/2020   Procedure: BIOPSY;  Surgeon: Lynann Bologna, MD;  Location: [facility] ENDOSCOPY;  Service: Endoscopy;;   BIOPSY  08/04/2021   Procedure: BIOPSY;  Surgeon: Jenel Lucks, MD;  Location: [facility] ENDOSCOPY;  Service: Gastroenterology;;   ESOPHAGOGASTRODUODENOSCOPY N/A 08/04/2021   Procedure: ESOPHAGOGASTRODUODENOSCOPY (EGD);  Surgeon: Jenel Lucks, MD;  Location: [facility]OSCOPY;  Service: Gastroenterology;  Laterality: N/A;   ESOPHAGOGASTRODUODENOSCOPY (EGD) WITH PROPOFOL N/A 06/23/2019   Procedure: ESOPHAGOGASTRODUODENOSCOPY (EGD) WITH PROPOFOL;  Surgeon: Shellia Cleverly, DO;  Location: [facility] ENDOSCOPY;  Service: Gastroenterology;  Laterality: N/A;   ESOPHAGOGASTRODUODENOSCOPY (EGD) WITH PROPOFOL N/A 12/12/2020   Procedure: ESOPHAGOGASTRODUODENOSCOPY (EGD) WITH PROPOFOL;  Surgeon: Lynann Bologna, MD;  Location[facility] END[facility]COPY;  Service: Endoscopy;  Laterality: N/A;   HOT HEMOSTASIS N/A 06/23/2019   Procedure: HOT HEMOSTASIS (ARGON PLASMA COAGULATION/BICAP);  Surgeon: Shellia Cleverly, DO;  Location: [facility] ENDOSCOPY;  Service: Gastroenterology;  Laterality: N/A;     A IV Location/Drains/Wounds Patient Lines/Drains/Airways Status     Active Line/Drains/Airways     Name Placement date Placement time Site Days   Peripheral IV 08/17/22 20 G 1" Right Antecubital 08/17/22  1639  Antecubital  less than 1   Peripheral IV 08/17/22 20 G Left Antecubital 08/17/22  1856  Antecubital  less than 1  Intake/Output Last 24 hours  Intake/Output Summary (Last 24 hours) at 08/17/2022 2024 Last data filed at 08/17/2022 1610 Gross per 24 hour  Intake 435.29 ml  Output --  Net 435.29 ml     Labs/Imaging Results for orders placed or performed during the hospital encounter of 08/17/22 (from the past 48 hour(s))  Comprehensive metabolic panel     Status: Abnormal   Collection Time: 08/17/22  4:23 PM  Result Value Ref Range   Sodium 125 (L) 135 - 145 mmol/L   Potassium 3.9 3.5 - 5.1 mmol/L    Comment: HEMOLYSIS AT THIS LEVEL MAY AFFECT RESULT   Chloride 80 (L) 98 - 111 mmol/L   CO2 17 (L) 22 - 32 mmol/L   Glucose, Bld 98 70 - 99 mg/dL    Comment: Glucose reference range applies only to samples taken after fasting for at least 8 hours.   BUN 23 (H) 6 - 20 mg/dL   Creatinine, Ser 9.60 0.61 - 1.24 mg/dL   Calcium 7.8 (L) 8.9 - 10.3 mg/dL   Total Protein 7.4 6.5 - 8.1 g/dL   Albumin 3.1 (L) 3.5 - 5.0 g/dL   AST 52 (H) 15 - 41 U/L    Comment: HEMOLYSIS AT THIS LEVEL MAY AFFECT RESULT   ALT 20 0 - 44 U/L    Comment: HEMOLYSIS AT THIS LEVEL MAY AFFECT RESULT   Alkaline Phosphatase 110 38 - 126 U/L   Total Bilirubin 2.6 (H) 0.3 - 1.2 mg/dL    Comment: HEMOLYSIS AT THIS LEVEL MAY AFFECT RESULT   GFR, Estimated >60 >60 mL/min    Comment: (NOTE) Calculated using the CKD-EPI Creatinine Equation (2021)    Anion gap 28 (H) 5 - 15    Comment: ELECTROLYTES REPEATED TO VERIFY Performed at [facility], [address], [place], [place] [address]   CBC with Differential     Status: Abnormal   Collection Time: 08/17/22  4:23 PM  Result Value Ref Range   WBC 15.6 (H) 4.0 - 10.5 K/uL   RBC 4.36 4.22 - 5.81 MIL/uL   Hemoglobin 13.1 13.0 - 17.0 g/dL   HCT 81.1 (L) 91.4 - 78.2 %   MCV 86.9 80.0 - 100.0 fL   MCH 30.0 26.0 - 34.0 pg   MCHC 34.6 30.0 - 36.0 g/dL   RDW 95.6 (H) 21.3 - 08.6 %   Platelets 589 (H) 150 - 400 K/uL   nRBC 0.0 0.0 - 0.2 %   Neutrophils Relative % 91 %   Neutro Abs 14.2 (H) 1.7 - 7.7 K/uL   Lymphocytes Relative 5 %   Lymphs Abs 0.8 0.7 - 4.0 K/uL   Monocytes Relative 4 %   Monocytes Absolute 0.6 0.1 - 1.0 K/uL   Eosinophils Relative 0 %    Eosinophils Absolute 0.0 0.0 - 0.5 K/uL   Basophils Relative 0 %   Basophils Absolute 0.0 0.0 - 0.1 K/uL   nRBC 0 0 /100 WBC   Abs Immature Granulocytes 0.00 0.00 - 0.07 K/uL   Polychromasia PRESENT     Comment: Performed at [facility], [address], [place], [place] [address]  Lipase, blood     Status: Abnormal   Collection Time: 08/17/22  4:23 PM  Result Value Ref Range   Lipase 767 (H) 11 - 51 U/L    Comment: RESULTS CONFIRMED BY MANUAL DILUTION CALLED CORRECTED REPORT TO T SHROPSHIRE RN AT [phone] BY D LONG Performed at [facility]  Lab, [address] [place], [place] [address] CORRECTED ON 06/22 AT 1853: PREVIOUSLY REPORTED AS 793 RESULTS CONFIRMED BY MANUAL DILUTION   Lactic acid, plasma     Status: Abnormal   Collection Time: 08/17/22  5:17 PM  Result Value Ref Range   Lactic Acid, Venous 3.0 (HH) 0.5 - 1.9 mmol/L    Comment: CRITICAL RESULT CALLED TO, READ BACK BY AND VERIFIED WITH T SHROPSHIRE RN AT [phone] BY D LONG Performed at [facility] Lab, [address], [place], [place] [address]   I-stat chem 8, ED     Status: Abnormal   Collection Time: 08/17/22  5:46 PM  Result Value Ref Range   Sodium 125 (L) 135 - 145 mmol/L   Potassium 3.4 (L) 3.5 - 5.1 mmol/L   Chloride 82 (L) 98 - 111 mmol/L   BUN 30 (H) 6 - 20 mg/dL   Creatinine, Ser 5.36 0.61 - 1.24 mg/dL   Glucose, Bld 644 (H) 70 - 99 mg/dL    Comment: Glucose reference range applies only to samples taken after fasting for at least 8 hours.   Calcium, Ion 0.88 (LL) 1.15 - 1.40 mmol/L   TCO2 26 22 - 32 mmol/L   Hemoglobin 13.9 13.0 - 17.0 g/dL   HCT 03.4 74.2 - 59.5 %   Comment NOTIFIED PHYSICIAN   Lactic acid, plasma     Status: Abnormal   Collection Time: 08/17/22  6:35 PM  Result Value Ref Range   Lactic Acid, Venous 2.3 (HH) 0.5 - 1.9 mmol/L    Comment: CRITICAL VALUE NOTED. VALUE IS CONSISTENT WITH PREVIOUSLY REPORTED/CALLED VALUE Performed at [facility], [address], [place], [place] [address]   Urinalysis, w/ Reflex to Culture (Infection Suspected) -Urine, Clean Catch     Status: Abnormal   Collection Time: 08/17/22  6:45 PM  Result Value Ref Range   Specimen Source URINE, CLEAN CATCH    Color, Urine AMBER (A) YELLOW    Comment: BIOCHEMICALS MAY BE AFFECTED BY COLOR   APPearance HAZY (A) CLEAR   Specific Gravity, Urine 1.027 1.005 - 1.030   pH 5.0 5.0 - 8.0   Glucose, UA NEGATIVE NEGATIVE mg/dL   Hgb urine dipstick NEGATIVE NEGATIVE   Bilirubin Urine NEGATIVE NEGATIVE   Ketones, ur 20 (A) NEGATIVE mg/dL   Protein, ur 30 (A) NEGATIVE mg/dL   Nitrite NEGATIVE NEGATIVE   Leukocytes,Ua TRACE (A) NEGATIVE   RBC / HPF 0-5 0 - 5 RBC/hpf   WBC, UA 11-20 0 - 5 WBC/hpf    Comment:        Reflex urine culture not performed if WBC <=10, OR if Squamous epithelial cells >5. If Squamous epithelial cells >5 suggest recollection.    Bacteria, UA FEW (A) NONE SEEN   Squamous Epithelial / HPF 0-5 0 - 5 /HPF   Mucus PRESENT    Hyaline Casts, UA PRESENT     Comment: Performed at [facility], [address] [place], [place] [address]  Urine Culture     Status: None (Preliminary result)   Collection Time: 08/17/22  6:45 PM   Specimen: Urine, Random  Result Value Ref Range   Specimen Description URINE, RANDOM    Special Requests      URINE, CLEAN CATCH Performed at [facility], [address] [place], [place] [address]    Culture PENDING    Report Status PENDING   Beta-hydroxybutyric acid     Status:  Abnormal   Collection Time: 08/17/22  6:46 PM  Result Value Ref Range   Beta-Hydroxybutyric Acid 5.33 (H) 0.05 - 0.27 mmol/L    Comment: RESULT CONFIRMED BY MANUAL DILUTION Performed at [facility], [address], [place], [place] [address]   POC occult blood, ED     Status: None   Collection Time: 08/17/22  8:16 PM  Result Value Ref Range   Fecal Occult Bld NEGATIVE NEGATIVE   DG CHEST PORT 1 VIEW  Result Date:  08/17/2022 CLINICAL DATA:  Upper abdominal pain. EXAM: PORTABLE CHEST 1 VIEW COMPARISON:  Jul 11, 2022 FINDINGS: The heart size and mediastinal contours are within normal limits. Mild, stable atelectatic changes are seen within the bilateral lung bases. There is no evidence of focal consolidation, pleural effusion or pneumothorax. The visualized skeletal structures are unremarkable. IMPRESSION: Mild, stable bibasilar atelectasis. Electronically Signed   By: Aram Candela M.D.   On: 08/17/2022 20:13   CT ABDOMEN PELVIS W CONTRAST  Result Date: 08/17/2022 CLINICAL DATA:  Three day history of upper abdominal pain associated with emesis and abdominal distention EXAM: CT ABDOMEN AND PELVIS WITH CONTRAST TECHNIQUE: Multidetector CT imaging of the abdomen and pelvis was performed using the standard protocol following bolus administration of intravenous contrast. RADIATION DOSE REDUCTION: This exam was performed according to the departmental dose-optimization program which includes automated exposure control, adjustment of the mA and/or kV according to patient size and/or use of iterative reconstruction technique. CONTRAST:  75mL OMNIPAQUE IOHEXOL 350 MG/ML SOLN COMPARISON:  CT abdomen and pelvis dated 07/11/2022 FINDINGS: Lower chest: No focal consolidation or pulmonary nodule in the lung bases. No pleural effusion or pneumothorax demonstrated. Partially imaged heart size is normal. Hepatobiliary: Diffuse parenchymal hypoattenuation can be seen with hepatic steatosis. No intra or extrahepatic biliary ductal dilation. Normal gallbladder. Pancreas: Decreased mild peripancreatic stranding surrounding the head and uncinate process. No focal lesions or main ductal dilation. Spleen: Normal in size without focal abnormality. Adrenals/Urinary Tract: No adrenal nodules. No suspicious renal mass, calculi or hydronephrosis. No focal bladder wall thickening. Stomach/Bowel: Normal appearance of the stomach. No evidence of bowel  wall thickening, distention, or inflammatory changes. Normal appendix. Vascular/Lymphatic: Aortic atherosclerosis. No enlarged abdominal or pelvic lymph nodes. Reproductive: Enlargement of the prostate with median lobe hypertrophy. Other: New moderate volume ascites. No free air or fluid collection. Musculoskeletal: No acute or abnormal lytic or blastic osseous lesions. Postsurgical changes of the paraumbilical region. Degenerative changes of the lumbar spine, most pronounced at L5-S1. IMPRESSION: 1. New moderate volume ascites. 2. Decreased mild peripancreatic stranding surrounding the head and uncinate process, which may represent improving pancreatitis. 3. Hepatic steatosis. 4. Enlargement of the prostate with median lobe hypertrophy. 5.  Aortic Atherosclerosis (ICD10-I70.0). Electronically Signed   By: Agustin Cree M.D.   On: 08/17/2022 18:55    Pending Labs Unresulted Labs (From admission, onward)     Start     Ordered   08/18/22 0500  Prealbumin  Tomorrow morning,   R        08/17/22 1941   08/18/22 0500  CBC with Differential  Tomorrow morning,   R        08/17/22 1946   08/18/22 0500  Comprehensive metabolic panel  Tomorrow morning,   R        08/17/22 1946   08/17/22 2300  Lactic acid, plasma  STAT Now then every 2 hours,   R      08/17/22 1946   08/17/22 2100  Basic metabolic panel  Now then every 6 hours,   R      08/17/22 1947   08/17/22 2100  CBC  Now then every 6 hours,   R      08/17/22 1947   08/17/22 2012  CK  Once,   STAT        08/17/22 2012   08/17/22 2012  Hepatic function panel  Once,   STAT        08/17/22 2012   08/17/22 2012  Potassium  Once,   STAT        08/17/22 2012   08/17/22 2012  Magnesium  Once,   STAT        08/17/22 2012   08/17/22 2012  Phosphorus  Once,   STAT        08/17/22 2012   08/17/22 2000  Osmolality  Once,   AD        08/17/22 2000   08/17/22 1946  Protime-INR  Once,   STAT        08/17/22 1945   08/17/22 1946  APTT  ONCE - STAT,   STAT         08/17/22 1946   08/17/22 1946  Procalcitonin  Add-on,   AD       References:    Procalcitonin Lower Respiratory Tract Infection AND Sepsis Procalcitonin Algorithm   08/17/22 1946   08/17/22 1945  Rapid urine drug screen (hospital performed)  ONCE - STAT,   STAT        08/17/22 1944   08/17/22 1945  Ethanol  Add-on,   AD        08/17/22 1944   08/17/22 1945  Ammonia  Once,   STAT       Question:  Release to patient  Answer:  Immediate   08/17/22 1944   08/17/22 1942  Osmolality, urine  Once,   URGENT        08/17/22 1941   08/17/22 1942  Creatinine, urine, random  Once,   URGENT        08/17/22 1941   08/17/22 1942  Sodium, urine, random  Once,   URGENT        08/17/22 1941   08/17/22 1942  TSH  Add-on,   AD        08/17/22 1941   08/17/22 1707  Culture, blood (routine x 2)  BLOOD CULTURE X 2,   R      08/17/22 1707   Unscheduled  Occult blood card to lab, stool RN will collect  As needed,   R     Question Answer Comment  Specimen to be collected by: RN will collect   Release to patient Immediate      08/17/22 1942            Vitals/Pain Today's Vitals   08/17/22 1645 08/17/22 1700 08/17/22 1806 08/17/22 1847  BP:  (!) 131/96  (!) 151/93  Pulse: (!) 121 (!) 136  (!) 113  Resp: (!) 22 (!) 29  20  Temp:    (!) 97.5 F (36.4 C)  TempSrc:    Oral  SpO2: 97% 98%  94%  Weight:      Height:      PainSc:   7      Isolation Precautions No active isolations  Medications Medications  lactated ringers infusion ( Intravenous New Bag/Given 08/17/22 2004)  thiamine (VITAMIN B1) injection 100 mg (has no administration in time range)  LORazepam (ATIVAN) tablet 1-4 mg (has no administration in time range)    Or  LORazepam (ATIVAN) injection 1-4 mg (has no administration in time range)  thiamine (VITAMIN B1) tablet 100 mg (has no administration in time range)    Or  thiamine (VITAMIN B1) injection 100 mg (has no administration in time range)  folic acid (FOLVITE) tablet 1  mg (has no administration in time range)  multivitamin with minerals tablet 1 tablet (has no administration in time range)  metroNIDAZOLE (FLAGYL) IVPB 500 mg (has no administration in time range)  ceFEPIme (MAXIPIME) 2 g in sodium chloride 0.9 % 100 mL IVPB (has no administration in time range)  HYDROmorphone (DILAUDID) injection 0.5-1 mg (has no administration in time range)  sodium chloride 0.9 % bolus 1,000 mL (0 mLs Intravenous Stopped 08/17/22 1941)  morphine (PF) 4 MG/ML injection 4 mg (4 mg Intravenous Given 08/17/22 1720)  ondansetron (ZOFRAN) injection 4 mg (4 mg Intravenous Given 08/17/22 1720)  iohexol (OMNIPAQUE) 350 MG/ML injection 75 mL (75 mLs Intravenous Contrast Given 08/17/22 1834)  metroNIDAZOLE (FLAGYL) IVPB 500 mg (0 mg Intravenous Stopped 08/17/22 1959)    Mobility walks with device     Focused Assessments    R Recommendations: See Admitting Provider Note  Report given to:   Additional Notes:

## 2022-08-17 NOTE — Subjective & Objective (Signed)
Presents with upper abdominal pain for the past 3 days Last alcohol intake was this morning.  Today has emesis and abdominal distention May have had fallen Tuesday and hurt his right shoulder History of alcohol abuse and chronic pancreatitis.   has had nausea and vomiting over the past 2 days   Reports an episode of bloody bowel movement No prior melena no shortness of breath or chest pain

## 2022-08-17 NOTE — Assessment & Plan Note (Addendum)
Likely in the setting of alcohol abuse Obtain electrolytes Continue to monitor serial BMET

## 2022-08-17 NOTE — Assessment & Plan Note (Signed)
most likely cause being alcohol induced   CT of abdomen showing acute pancreatitis  no evidence of pseudocyst Lipase     Component Value Date/Time   LIPASE 767 (H) 08/17/2022 1623   Lipid Panel     Component Value Date/Time   CHOL 158 08/02/2021 1247   TRIG 97 07/12/2022 0853   HDL 71 08/02/2021 1247   CHOLHDL 2.2 08/02/2021 1247   LDLCALC 73 08/02/2021 1247   LABVLDL 14 08/02/2021 1247    Will rehydrate with aggressive IV fluids Keep n.p.o. Follow clinically  -  strongly advised patient to stop drinking alcohol -Check lipid panel   Control pain with IV pain medications If no significant improvement in the next 24 hours may benefit from GI consult

## 2022-08-17 NOTE — Assessment & Plan Note (Signed)
On tramadol for pain

## 2022-08-17 NOTE — Sepsis Progress Note (Signed)
Sepsis protocol is being followed by [facility]. 

## 2022-08-17 NOTE — Assessment & Plan Note (Addendum)
Patient reports history of seizures not on any seizure medication States last seizure was about months ago unsure if this is related to alcohol withdrawal. Order seizure precautions and EEG patient will need father follow-up with neurology to establish care and or treatment if indicated If EEG abnormal will need neurology consult

## 2022-08-17 NOTE — H&P (Addendum)
Samuel Blevins ZOX:[id number] DOB: [birth year] DOA: 08/17/2022     PCP: Samuel Frisk, MD   Outpatient Specialists:    NONE    Patient arrived to ER on 08/17/22 at 1612 Referred by Attending Samuel Grandchild, MD   Patient coming from:   homeless  Chief Complaint:   Chief Complaint  Patient presents with   Abdominal Pain   Emesis    HPI: Samuel Blevins is a 57 y.o. male with medical history significant of alcohol abuse, tobacco abuse, chronic pancreatitis, chronic pain, hx of seizure do, homeless    Presented with  abd pain  Presents with upper abdominal pain for the past 3 days Last alcohol intake was this morning.  Today has emesis and abdominal distention May have had fallen Tuesday and hurt his right shoulder History of alcohol abuse and chronic pancreatitis.   has had nausea and vomiting over the past 2 days   Reports an episode of bloody bowel movement No prior melena no shortness of breath or chest pain    Recently aDMITTED FOR PANCREASTITIS  Still drinking Etoh and homeless Reports chills no fever Reports he occasionally has BRBPR Just on a tissue paper when he wipes   significant ETOH intake  drink a fifth a day Alt one was this AM  Does  smoke   Lab Results  Component Value Date   SARSCOV2NAA NEGATIVE 06/14/2022   SARSCOV2NAA NEGATIVE 06/10/2022   SARSCOV2NAA NEGATIVE 04/11/2022   SARSCOV2NAA NEGATIVE 05/07/2021    Regarding pertinent Chronic problems:      HTN on Coreg   Chronic anemia - baseline hg Hemoglobin & Hematocrit  Recent Labs    07/14/22 1034 08/17/22 1623 08/17/22 1746  HGB 11.3* 13.1 13.9   Iron/TIBC/Ferritin/ %Sat    Component Value Date/Time   IRON 26 (L) 10/04/2021 0827   IRON 15 (L) 08/02/2021 1247   TIBC 398 10/04/2021 0827   TIBC 428 08/02/2021 1247   FERRITIN 33 08/02/2021 1247   IRONPCTSAT 7 (L) 10/04/2021 0827   IRONPCTSAT 4 (LL) 08/02/2021 1247    Seizure DO - last seizure  last month ? Not an  any meds??   While in ER: Clinical Course as of 08/17/22 2012  Sat Aug 17, 2022  1943 Workup notable for hyponatremia, elevated lipase, leukocytosis.  Urinalysis not very concerning for infection but possible.  CT scan with progression of previous episode of pancreatitis.  Also with new ascites.  Performed bedside ultrasound but no ascitic pocket large enough to safely perform paracentesis.  Discussed with Dr. Adela Glimpse who will admit the patient for further management. [WS]  1951 Rectal exam with some brown stool, no melena or gross blood, chaperoned by NT [WS]    Clinical Course User Index [WS] Samuel Grandchild, MD   Found to have elevated white blood cell count 15.6 Meeting sepsis criteria started on broad-spectrum antibiotics including metronidazole cefepime and vancomycin Sodium noted down to 125 potassium 3.4 Blood cultures obtained Lactic acid noted to be elevated at 3.0  Lab Orders         Culture, blood (routine x 2)         Urine Culture         Comprehensive metabolic panel         CBC with Differential         Lipase, blood         Urinalysis, w/ Reflex to Culture (Infection Suspected) -Urine, Clean Catch  Lactic acid, plasma         Lactic acid, plasma         Beta-hydroxybutyric acid         Potassium         I-stat chem 8, ED      CT HEAD ordered    CXR - Mild, stable bibasilar atelectasis.   CTabd/pelvis - non-acute new ascites  New moderate volume ascites.  Decreased mild peripancreatic stranding surrounding the head and uncinate process, which may represent improving pancreatitis.  Hepatic steatosis.   Following Medications were ordered in ER: Medications  lactated ringers infusion (has no administration in time range)  metroNIDAZOLE (FLAGYL) IVPB 500 mg (500 mg Intravenous New Bag/Given 08/17/22 1859)  ceFEPIme (MAXIPIME) 2 g in sodium chloride 0.9 % 100 mL IVPB (0 g Intravenous Stopped 08/17/22 1918)  sodium chloride 0.9 % bolus 1,000 mL (1,000  mLs Intravenous New Bag/Given 08/17/22 1720)  morphine (PF) 4 MG/ML injection 4 mg (4 mg Intravenous Given 08/17/22 1720)  ondansetron (ZOFRAN) injection 4 mg (4 mg Intravenous Given 08/17/22 1720)  iohexol (OMNIPAQUE) 350 MG/ML injection 75 mL (75 mLs Intravenous Contrast Given 08/17/22 1834)     ED Triage Vitals  Enc Vitals Group     BP 08/17/22 1614 (!) 155/143     Pulse Rate 08/17/22 1614 (!) 133     Resp 08/17/22 1614 20     Temp 08/17/22 1614 97.7 F (36.5 C)     Temp Source 08/17/22 1847 Oral     SpO2 08/17/22 1613 99 %     Weight 08/17/22 1615 165 lb (74.8 kg)     Height 08/17/22 1615 6\' 1"  (1.854 m)     Head Circumference --      Peak Flow --      Pain Score 08/17/22 1615 9     Pain Loc --      Pain Edu? --      Excl. in GC? --   TMAX(24)@     _________________________________________ Significant initial  Findings: Abnormal Labs Reviewed  COMPREHENSIVE METABOLIC PANEL - Abnormal; Notable for the following components:      Result Value   Sodium 125 (*)    Chloride 80 (*)    CO2 17 (*)    BUN 23 (*)    Calcium 7.8 (*)    Albumin 3.1 (*)    AST 52 (*)    Total Bilirubin 2.6 (*)    Anion gap 28 (*)    All other components within normal limits  CBC WITH DIFFERENTIAL/PLATELET - Abnormal; Notable for the following components:   WBC 15.6 (*)    HCT 37.9 (*)    RDW 21.3 (*)    Platelets 589 (*)    Neutro Abs 14.2 (*)    All other components within normal limits  LIPASE, BLOOD - Abnormal; Notable for the following components:   Lipase 767 (*)    All other components within normal limits  URINALYSIS, W/ REFLEX TO CULTURE (INFECTION SUSPECTED) - Abnormal; Notable for the following components:   Color, Urine AMBER (*)    APPearance HAZY (*)    Ketones, ur 20 (*)    Protein, ur 30 (*)    Leukocytes,Ua TRACE (*)    Bacteria, UA FEW (*)    All other components within normal limits  LACTIC ACID, PLASMA - Abnormal; Notable for the following components:   Lactic Acid,  Venous 3.0 (*)    All other components within normal  limits  I-STAT CHEM 8, ED - Abnormal; Notable for the following components:   Sodium 125 (*)    Potassium 3.4 (*)    Chloride 82 (*)    BUN 30 (*)    Glucose, Bld 100 (*)    Calcium, Ion 0.88 (*)    All other components within normal limits    _________________________ Troponin  ordered     ECG: Ordered Personally reviewed and interpreted by me showing: HR :  134 Rhythm: : Sinus tachycardia with occasional Premature ventricular complexes Possible Left atrial enlargement Nonspecific ST abnormality  QTC 471  BNP (last 3 results) Recent Labs    06/09/22 2026 06/16/22 0340 06/17/22 0431  BNP 17.3 130.1* 115.9*     COVID-19 Labs  No results for input(s): "DDIMER", "FERRITIN", "LDH", "CRP" in the last 72 hours.  Lab Results  Component Value Date   SARSCOV2NAA NEGATIVE 06/14/2022   SARSCOV2NAA NEGATIVE 06/10/2022   SARSCOV2NAA NEGATIVE 04/11/2022   SARSCOV2NAA NEGATIVE 05/07/2021    ____________________ This patient meets SIRS Criteria and may be septic.    The recent clinical data is shown below. Vitals:   08/17/22 1630 08/17/22 1645 08/17/22 1700 08/17/22 1847  BP: (!) 144/94  (!) 131/96 (!) 151/93  Pulse: (!) 123 (!) 121 (!) 136 (!) 113  Resp: (!) 25 (!) 22 (!) 29 20  Temp:    (!) 97.5 F (36.4 C)  TempSrc:    Oral  SpO2: 97% 97% 98% 94%  Weight:      Height:         WBC     Component Value Date/Time   WBC 15.6 (H) 08/17/2022 1623   LYMPHSABS 0.8 08/17/2022 1623   LYMPHSABS 0.9 02/14/2022 1024   MONOABS 0.6 08/17/2022 1623   EOSABS 0.0 08/17/2022 1623   EOSABS 0.0 02/14/2022 1024   BASOSABS 0.0 08/17/2022 1623   BASOSABS 0.0 02/14/2022 1024    Lactic Acid, Venous    Component Value Date/Time   LATICACIDVEN 3.0 (HH) 08/17/2022 1717      Lactic Acid, Venous    Component Value Date/Time   LATICACIDVEN 2.6 (HH) 08/17/2022 2040    Procalcitonin   Ordered       UA   no evidence of UTI      Urine analysis:    Component Value Date/Time   COLORURINE AMBER (A) 08/17/2022 1845   APPEARANCEUR HAZY (A) 08/17/2022 1845   LABSPEC 1.027 08/17/2022 1845   PHURINE 5.0 08/17/2022 1845   GLUCOSEU NEGATIVE 08/17/2022 1845   HGBUR NEGATIVE 08/17/2022 1845   BILIRUBINUR NEGATIVE 08/17/2022 1845   KETONESUR 20 (A) 08/17/2022 1845   PROTEINUR 30 (A) 08/17/2022 1845   NITRITE NEGATIVE 08/17/2022 1845   LEUKOCYTESUR TRACE (A) 08/17/2022 1845      ABX started Antibiotics Given (last 72 hours)     Date/Time Action Medication Dose Rate   08/17/22 1848 New Bag/Given   ceFEPIme (MAXIPIME) 2 g in sodium chloride 0.9 % 100 mL IVPB 2 g 200 mL/hr   08/17/22 1859 New Bag/Given   metroNIDAZOLE (FLAGYL) IVPB 500 mg 500 mg 100 mL/hr        __________________________________________________________ Recent Labs  Lab 08/17/22 1623 08/17/22 1746  NA 125* 125*  K 3.9 3.4*  CO2 17*  --   GLUCOSE 98 100*  BUN 23* 30*  CREATININE 1.17 1.20  CALCIUM 7.8*  --     Cr stable,  Lab Results  Component Value Date   CREATININE 1.20 08/17/2022  CREATININE 1.17 08/17/2022   CREATININE 0.82 07/14/2022    Recent Labs  Lab 08/17/22 1623  AST 52*  ALT 20  ALKPHOS 110  BILITOT 2.6*  PROT 7.4  ALBUMIN 3.1*   Lab Results  Component Value Date   CALCIUM 7.8 (L) 08/17/2022   PHOS 2.9 06/17/2022    Plt: Lab Results  Component Value Date   PLT 589 (H) 08/17/2022          Recent Labs  Lab 08/17/22 1623 08/17/22 1746 08/17/22 2040  WBC 15.6*  --  14.2*  NEUTROABS 14.2*  --   --   HGB 13.1 13.9 11.3*  HCT 37.9* 41.0 34.0*  MCV 86.9  --  88.1  PLT 589*  --  469*    HG/HCT  stable,      Component Value Date/Time   HGB 13.9 08/17/2022 1746   HGB 12.8 (L) 02/14/2022 1024   HCT 41.0 08/17/2022 1746   HCT 37.7 02/14/2022 1024   MCV 86.9 08/17/2022 1623   MCV 92 02/14/2022 1024     Recent Labs  Lab 08/17/22 1623  LIPASE 767*   Recent Labs  Lab 08/17/22 2040  AMMONIA  28      _______________________________________________ Hospitalist was called for admission for   Severe acute pancreatitis  High anion gap metabolic acidosis    SIRS      The following Work up has been ordered so far:  Orders Placed This Encounter  Procedures   Culture, blood (routine x 2)   Urine Culture   CT ABDOMEN PELVIS W CONTRAST   Comprehensive metabolic panel   CBC with Differential   Lipase, blood   Urinalysis, w/ Reflex to Culture (Infection Suspected) -Urine, Clean Catch   Lactic acid, plasma   Lactic acid, plasma   Beta-hydroxybutyric acid   Potassium   DO NOT delay antibiotics if unable to obtain blood culture.   Code Sepsis activation.  This occurs automatically when order is signed and prioritizes pharmacy, lab, and radiology services for STAT collections and interventions.  If [facility] downtime, call [facility] [phone]) to activate Code Sepsis.   pharmacy consult   ceFEPime (MAXIPIME) per pharmacy consult            Consult for [facility] Admission   I-stat chem 8, ED   EKG 12-Lead   Insert 2nd peripheral IV if not already present.     OTHER Significant initial  Findings:  labs showing:         Cultures:    Component Value Date/Time   SDES BLOOD SITE NOT SPECIFIED 06/13/2022 2310   SDES BLOOD LEFT FOREARM 06/13/2022 2310   SPECREQUEST  06/13/2022 2310    BOTTLES DRAWN AEROBIC AND ANAEROBIC Blood Culture adequate volume   SPECREQUEST  06/13/2022 2310    BOTTLES DRAWN AEROBIC AND ANAEROBIC Blood Culture adequate volume   CULT  06/13/2022 2310    NO GROWTH 5 DAYS Performed at [facility], [address] [place], [place] [address]    CULT  06/13/2022 2310    NO GROWTH 5 DAYS Performed at [facility], [address], [place], [place] [address]    REPTSTATUS 06/18/2022 FINAL 06/13/2022 2310   REPTSTATUS 06/18/2022 FINAL 06/13/2022 2310     Radiological Exams on Admission: DG CHEST PORT 1 VIEW  Result Date:  08/17/2022 CLINICAL DATA:  Upper abdominal pain. EXAM: PORTABLE CHEST 1 VIEW COMPARISON:  Jul 11, 2022 FINDINGS: The heart size and mediastinal contours are within  normal limits. Mild, stable atelectatic changes are seen within the bilateral lung bases. There is no evidence of focal consolidation, pleural effusion or pneumothorax. The visualized skeletal structures are unremarkable. IMPRESSION: Mild, stable bibasilar atelectasis. Electronically Signed   By: Aram Candela M.D.   On: 08/17/2022 20:13   CT ABDOMEN PELVIS W CONTRAST  Result Date: 08/17/2022 CLINICAL DATA:  Three day history of upper abdominal pain associated with emesis and abdominal distention EXAM: CT ABDOMEN AND PELVIS WITH CONTRAST TECHNIQUE: Multidetector CT imaging of the abdomen and pelvis was performed using the standard protocol following bolus administration of intravenous contrast. RADIATION DOSE REDUCTION: This exam was performed according to the departmental dose-optimization program which includes automated exposure control, adjustment of the mA and/or kV according to patient size and/or use of iterative reconstruction technique. CONTRAST:  75mL OMNIPAQUE IOHEXOL 350 MG/ML SOLN COMPARISON:  CT abdomen and pelvis dated 07/11/2022 FINDINGS: Lower chest: No focal consolidation or pulmonary nodule in the lung bases. No pleural effusion or pneumothorax demonstrated. Partially imaged heart size is normal. Hepatobiliary: Diffuse parenchymal hypoattenuation can be seen with hepatic steatosis. No intra or extrahepatic biliary ductal dilation. Normal gallbladder. Pancreas: Decreased mild peripancreatic stranding surrounding the head and uncinate process. No focal lesions or main ductal dilation. Spleen: Normal in size without focal abnormality. Adrenals/Urinary Tract: No adrenal nodules. No suspicious renal mass, calculi or hydronephrosis. No focal bladder wall thickening. Stomach/Bowel: Normal appearance of the stomach. No evidence of bowel  wall thickening, distention, or inflammatory changes. Normal appendix. Vascular/Lymphatic: Aortic atherosclerosis. No enlarged abdominal or pelvic lymph nodes. Reproductive: Enlargement of the prostate with median lobe hypertrophy. Other: New moderate volume ascites. No free air or fluid collection. Musculoskeletal: No acute or abnormal lytic or blastic osseous lesions. Postsurgical changes of the paraumbilical region. Degenerative changes of the lumbar spine, most pronounced at L5-S1. IMPRESSION: 1. New moderate volume ascites. 2. Decreased mild peripancreatic stranding surrounding the head and uncinate process, which may represent improving pancreatitis. 3. Hepatic steatosis. 4. Enlargement of the prostate with median lobe hypertrophy. 5.  Aortic Atherosclerosis (ICD10-I70.0). Electronically Signed   By: Agustin Cree M.D.   On: 08/17/2022 18:55   _______________________________________________________________________________________________________ Latest  Blood pressure (!) 151/93, pulse (!) 113, temperature (!) 97.5 F (36.4 C), temperature source Oral, resp. rate 20, height 6\' 1"  (1.854 m), weight 74.8 kg, SpO2 94 %.   Vitals  labs and radiology finding personally reviewed  Review of Systems:    Pertinent positives include:   abdominal pain, nausea, vomiting,  Constitutional:  No weight loss, night sweats, Fevers, chills, fatigue, weight loss  HEENT:  No headaches, Difficulty swallowing,Tooth/dental problems,Sore throat,  No sneezing, itching, ear ache, nasal congestion, post nasal drip,  Cardio-vascular:  No chest pain, Orthopnea, PND, anasarca, dizziness, palpitations.no Bilateral lower extremity swelling  GI:  No heartburn, indigestion, diarrhea, change in bowel habits, loss of appetite, melena, blood in stool, hematemesis Resp:  no shortness of breath at rest. No dyspnea on exertion, No excess mucus, no productive cough, No non-productive cough, No coughing up of blood.No change in color of  mucus.No wheezing. Skin:  no rash or lesions. No jaundice GU:  no dysuria, change in color of urine, no urgency or frequency. No straining to urinate.  No flank pain.  Musculoskeletal:  No joint pain or no joint swelling. No decreased range of motion. No back pain.  Psych:  No change in mood or affect. No depression or anxiety. No memory loss.  Neuro: no localizing neurological complaints, no tingling,  no weakness, no double vision, no gait abnormality, no slurred speech, no confusion  All systems reviewed and apart from HOPI all are negative _______________________________________________________________________________________________ Past Medical History:   Past Medical History:  Diagnosis Date   Alcohol withdrawal syndrome with complication (HCC)    Alcoholism (HCC)    Elevated AST (SGOT)    Gastrointestinal hemorrhage    Homeless    Hypertension    Pancreatic insufficiency    takes Creon   Symptomatic anemia 11/23/2015   Thrombocytopenia (HCC) 05/09/2021      Past Surgical History:  Procedure Laterality Date   BIOPSY  06/23/2019   Procedure: BIOPSY;  Surgeon: Shellia Cleverly, DO;  Location: [facility] ENDOSCOPY;  Service: Gastroenterology;;   BIOPSY  12/12/2020   Procedure: BIOPSY;  Surgeon: Lynann Bologna, MD;  Location: [facility] ENDOSCOPY;  Service: Endoscopy;;   BIOPSY  08/04/2021   Procedure: BIOPSY;  Surgeon: Jenel Lucks, MD;  Location: [facility] ENDOSCOPY;  Service: Gastroenterology;;   ESOPHAGOGASTRODUODENOSCOPY N/A 08/04/2021   Procedure: ESOPHAGOGASTRODUODENOSCOPY (EGD);  Surgeon: Jenel Lucks, MD;  Location: [facility] ENDOSCOPY;  Service: Gastroenterology;  Laterality: N/A;   ESOPHAGOGASTRODUODENOSCOPY (EGD) WITH PROPOFOL N/A 06/23/2019   Procedure: ESOPHAGOGASTRODUODENOSCOPY (EGD) WITH PROPOFOL;  Surgeon: Shellia Cleverly, DO;  Location: [facility] ENDOSCOPY;  Service: Gastroenterology;  Laterality: N/A;   ESOPHAGOGASTRODUODENOSCOPY (EGD) WITH PROPOFOL N/A 12/12/2020    Procedure: ESOPHAGOGASTRODUODENOSCOPY (EGD) WITH PROPOFOL;  Surgeon: Lynann Bologna, MD;  Location: [facility] ENDOSCOPY;  Service: Endoscopy;  Laterality: N/A;   HOT HEMOSTASIS N/A 06/23/2019   Procedure: HOT HEMOSTASIS (ARGON PLASMA COAGULATION/BICAP);  Surgeon: Shellia Cleverly, DO;  Location: [facility] ENDOSCOPY;  Service: Gastroenterology;  Laterality: N/A;    Social History:  Ambulatory   walker       reports that he has been smoking cigarettes. He has a 15.00 pack-year smoking history. He has never used smokeless tobacco. He reports current alcohol use of about 28.0 standard drinks of alcohol per week. He reports that he does not use drugs.     Family History:   Family History  Problem Relation Age of Onset   Diabetes Mellitus II Neg Hx    Colon cancer Neg Hx    Stomach cancer Neg Hx    Pancreatic cancer Neg Hx    ______________________________________________________________________________________________ Allergies: No Known Allergies   Prior to Admission medications   Medication Sig Start Date End Date Taking? Authorizing Provider  acetaminophen (TYLENOL) 325 MG tablet Take 2 tablets (650 mg total) by mouth every 6 (six) hours as needed for fever, moderate pain, mild pain or headache. Patient not taking: Reported on 07/11/2022 12/12/20   Belva Agee, MD  carvedilol (COREG) 3.125 MG tablet Take 1 tablet (3.125 mg total) by mouth 2 (two) times daily with a meal. Patient not taking: Reported on 07/11/2022 07/10/22   Samuel Frisk, MD  folic acid (FOLVITE) 1 MG tablet Take 1 tablet (1 mg total) by mouth daily. 07/14/22   Rolly Salter, MD  Iron, Ferrous Sulfate, 325 (65 Fe) MG TABS Take 1 tablet ( 325 mg) by mouth daily. Patient not taking: Reported on 07/11/2022 05/24/22   Samuel Frisk, MD  lipase/protease/amylase (CREON) 12000-38000 units CPEP capsule Take 1 capsule (12,000 Units total) by mouth 3 (three) times daily before meals. 07/14/22   Rolly Salter, MD  magnesium  oxide (MAG-OX) 400 MG tablet Take 1 tablet (400 mg total) by mouth in the morning and at bedtime. 07/14/22   Rolly Salter, MD  Multiple Vitamin (MULTIVITAMIN WITH  MINERALS) TABS tablet Take 1 tablet by mouth daily. 07/14/22   Rolly Salter, MD  nicotine (NICODERM CQ - DOSED IN MG/24 HOURS) 14 mg/24hr patch Place 1 patch (14 mg total) onto the skin daily. 07/14/22   Rolly Salter, MD  pantoprazole (PROTONIX) 40 MG tablet Take 1 tablet (40 mg total) by mouth 2 (two) times daily before a meal. 07/14/22   Rolly Salter, MD  polyethylene glycol (MIRALAX / GLYCOLAX) 17 g packet Take 17 g by mouth daily as needed for mild constipation or moderate constipation. 07/14/22   Rolly Salter, MD  thiamine (VITAMIN B-1) 100 MG tablet Take 1 tablet (100 mg total) by mouth daily. 07/14/22   Rolly Salter, MD  traMADol (ULTRAM) 50 MG tablet Take 1 tablet (50 mg total) by mouth every 8 (eight) hours as needed for severe pain. 07/17/22   Samuel Frisk, MD    ___________________________________________________________________________________________________ Physical Exam:    08/17/2022    6:47 PM 08/17/2022    5:00 PM 08/17/2022    4:45 PM  Vitals with BMI  Systolic 151 131   Diastolic 93 96   Pulse 113 136 121     1. General:  in No  Acute distress    Chronically ill   -appearing 2. Psychological: Alert and   Oriented 3. Head/ENT Dry Mucous Membranes                          Head Non traumatic, neck supple                            Poor Dentition 4. SKIN:  decreased Skin turgor,  Skin clean Dry and intact no rash    5. Heart: Regular rate and rhythm no  Murmur, no Rub or gallop 6. Lungs: no wheezes or crackles   7. Abdomen: Soft, ender distended  bowel sounds present 8. Lower extremities: no clubbing, cyanosis, no  edema 9. Neurologically Grossly intact, moving all 4 extremities equally no asterixis or tremor present 10. MSK: Normal range of motion    Chart has been  reviewed  ______________________________________________________________________________________________  Assessment/Plan 57 y.o. male with medical history significant of alcohol abuse, tobacco abuse, chronic pancreatitis, chronic pain, hx of seizure do, homeless  Admitted for Severe acute pancreatitis  High anion gap metabolic acidosis  SIRS possible SBP   Present on Admission:  Pancreatitis  Chronic alcohol abuse  Chronic pain syndrome  Tobacco abuse  Essential hypertension  Chronic anemia  Sepsis (HCC)  SBP (spontaneous bacterial peritonitis) (HCC)  Hyponatremia  Hypokalemia  Hypomagnesemia     Chronic alcohol abuse CIWA protocol  Pt is interested in quitting    Chronic pain syndrome On tramadol for pain   Tobacco abuse  - Spoke about importance of quitting spent 5 minutes discussing options for treatment, prior attempts at quitting, and dangers of smoking  -At this point patient is   interested in quitting  - order nicotine patch   - nursing tobacco cessation protocol   Pancreatitis most likely cause being alcohol induced   CT of abdomen showing acute pancreatitis  no evidence of pseudocyst Lipase     Component Value Date/Time   LIPASE 767 (H) 08/17/2022 1623   Lipid Panel     Component Value Date/Time   CHOL 158 08/02/2021 1247   TRIG 97 07/12/2022 0853   HDL 71 08/02/2021 1247   CHOLHDL  2.2 08/02/2021 1247   LDLCALC 73 08/02/2021 1247   LABVLDL 14 08/02/2021 1247    Will rehydrate with aggressive IV fluids Keep n.p.o. Follow clinically  -  strongly advised patient to stop drinking alcohol -Check lipid panel   Control pain with IV pain medications If no significant improvement in the next 24 hours may benefit from GI consult     Essential hypertension Restart Coreg 3.125 mg po bid  Chronic anemia Hemoccult negative hemoglobin stable patient reports intermittent slight amount of bright red blood on the tissue paper when he wipes but no  melena and no significant bleeding continue to monitor  Homeless Will need transitional care consult  Seizure [facility]) Patient reports history of seizures not on any seizure medication States last seizure was about months ago unsure if this is related to alcohol withdrawal. Order seizure precautions and EEG patient will need father follow-up with neurology to establish care and or treatment if indicated If EEG abnormal will need neurology consult   Sepsis (HCC)  -SIRS criteria met with  elevated white blood cell count,       Component Value Date/Time   WBC 14.2 (H) 08/17/2022 2040   LYMPHSABS 0.8 08/17/2022 1623   LYMPHSABS 0.9 02/14/2022 1024     tachycardia   ,  RR >20 Today's Vitals   08/17/22 2038 08/17/22 2040 08/17/22 2100 08/17/22 2150  BP:   (!) 147/89 129/89  Pulse: (!) 120  (!) 123 (!) 126  Resp:   16 14  Temp:    98.1 F (36.7 C)  TempSrc:    Oral  SpO2:   92% 93%  Weight:      Height:      PainSc:  8   0-No pain    -Most likely source being: intra-abdominal,   Patient meeting criteria for Severe sepsis with    evidence of end organ damage/organ dysfunction such as       Component Value Date/Time   LATICACIDVEN 2.6 (HH) 08/17/2022 2040      - Obtain serial lactic acid and procalcitonin level.  - Initiated IV antibiotics in ER: Antibiotics Given (last 72 hours)     Date/Time Action Medication Dose Rate   08/17/22 1848 New Bag/Given   ceFEPIme (MAXIPIME) 2 g in sodium chloride 0.9 % 100 mL IVPB 2 g 200 mL/hr   08/17/22 1859 New Bag/Given   metroNIDAZOLE (FLAGYL) IVPB 500 mg 500 mg 100 mL/hr       Will continue  on : Cefepime and metronidazole   - await results of blood and urine culture  - Rehydrate aggressively  Intravenous fluids were administered,      9:54 PM   SBP (spontaneous bacterial peritonitis) (HCC) Will need father evaluation obtain IR consult for paracentesis Started on IV antibiotics cefepime and metronidazole in the ER will  continue monitoring in stepdown  Hyponatremia Likely in the setting of alcohol abuse Obtain electrolytes Continue to monitor serial BMET  Hypokalemia Will replace Also noted hypomagnesemia will replace as well check phosphate level Replace and recheck in a.m. Administer thiamine  Hypomagnesemia Will replete and recheck in a.m.    Other plan as per orders.  DVT prophylaxis:  SCD     Code Status:    Code Status: Prior FULL CODE  as per patient   I had personally discussed CODE STATUS with patient   ACP none   Family Communication:   Family not at  Bedside    Diet npo   Disposition  Plan:   To home once workup is complete and patient is stable   Following barriers for discharge:                            Electrolytes corrected                                Sepsis resolved                    Would benefit from PT/OT eval prior to DC  Ordered                                       Transition of care consulted                   Nutrition    consulted                                       Consults called: none   Admission status:  ED Disposition     ED Disposition  Admit   Condition  --   Comment  The patient appears reasonably stabilized for admission considering the current resources, flow, and capabilities available in the ED at this time, and I doubt any other [facility] requiring further screening and/or treatment in the ED prior to admission is  present.           Obs       Level of care     progressive tele indefinitely please discontinue once patient no longer qualifies COVID-19 Labs       Rayshawn Visconti 08/17/2022, 11:59 PM    [facility]     after 2 AM please page floor coverage PA If 7AM-7PM, please contact the day team taking care of the patient using [email or web address]

## 2022-08-17 NOTE — Assessment & Plan Note (Signed)
-  SIRS criteria met with  elevated white blood cell count,       Component Value Date/Time   WBC 14.2 (H) 08/17/2022 2040   LYMPHSABS 0.8 08/17/2022 1623   LYMPHSABS 0.9 02/14/2022 1024     tachycardia   ,  RR >20 Today's Vitals   08/17/22 2038 08/17/22 2040 08/17/22 2100 08/17/22 2150  BP:   (!) 147/89 129/89  Pulse: (!) 120  (!) 123 (!) 126  Resp:   16 14  Temp:    98.1 F (36.7 C)  TempSrc:    Oral  SpO2:   92% 93%  Weight:      Height:      PainSc:  8   0-No pain    -Most likely source being: intra-abdominal,   Patient meeting criteria for Severe sepsis with    evidence of end organ damage/organ dysfunction such as       Component Value Date/Time   LATICACIDVEN 2.6 (HH) 08/17/2022 2040      - Obtain serial lactic acid and procalcitonin level.  - Initiated IV antibiotics in ER: Antibiotics Given (last 72 hours)     Date/Time Action Medication Dose Rate   08/17/22 1848 New Bag/Given   ceFEPIme (MAXIPIME) 2 g in sodium chloride 0.9 % 100 mL IVPB 2 g 200 mL/hr   08/17/22 1859 New Bag/Given   metroNIDAZOLE (FLAGYL) IVPB 500 mg 500 mg 100 mL/hr       Will continue  on : Cefepime and metronidazole   - await results of blood and urine culture  - Rehydrate aggressively  Intravenous fluids were administered,      9:54 PM

## 2022-08-17 NOTE — Assessment & Plan Note (Signed)
Will need transitional care consult

## 2022-08-17 NOTE — Assessment & Plan Note (Signed)
Will replete and recheck in a.m.

## 2022-08-17 NOTE — Assessment & Plan Note (Signed)
Hemoccult negative hemoglobin stable patient reports intermittent slight amount of bright red blood on the tissue paper when he wipes but no melena and no significant bleeding continue to monitor

## 2022-08-17 NOTE — Assessment & Plan Note (Signed)
Restart Coreg 3.125 mg po bid

## 2022-08-18 ENCOUNTER — Inpatient Hospital Stay (HOSPITAL_COMMUNITY): Payer: Medicaid Other

## 2022-08-18 ENCOUNTER — Observation Stay (HOSPITAL_COMMUNITY): Payer: Medicaid Other

## 2022-08-18 DIAGNOSIS — E871 Hypo-osmolality and hyponatremia: Secondary | ICD-10-CM | POA: Diagnosis present

## 2022-08-18 DIAGNOSIS — Z720 Tobacco use: Secondary | ICD-10-CM | POA: Diagnosis not present

## 2022-08-18 DIAGNOSIS — Z5900 Homelessness unspecified: Secondary | ICD-10-CM | POA: Diagnosis not present

## 2022-08-18 DIAGNOSIS — K860 Alcohol-induced chronic pancreatitis: Secondary | ICD-10-CM | POA: Diagnosis present

## 2022-08-18 DIAGNOSIS — A419 Sepsis, unspecified organism: Secondary | ICD-10-CM | POA: Diagnosis present

## 2022-08-18 DIAGNOSIS — Z6821 Body mass index (BMI) 21.0-21.9, adult: Secondary | ICD-10-CM | POA: Diagnosis not present

## 2022-08-18 DIAGNOSIS — E876 Hypokalemia: Secondary | ICD-10-CM | POA: Diagnosis present

## 2022-08-18 DIAGNOSIS — K859 Acute pancreatitis without necrosis or infection, unspecified: Secondary | ICD-10-CM | POA: Diagnosis present

## 2022-08-18 DIAGNOSIS — D649 Anemia, unspecified: Secondary | ICD-10-CM

## 2022-08-18 DIAGNOSIS — K852 Alcohol induced acute pancreatitis without necrosis or infection: Secondary | ICD-10-CM

## 2022-08-18 DIAGNOSIS — R569 Unspecified convulsions: Secondary | ICD-10-CM

## 2022-08-18 DIAGNOSIS — G894 Chronic pain syndrome: Secondary | ICD-10-CM | POA: Diagnosis present

## 2022-08-18 DIAGNOSIS — R109 Unspecified abdominal pain: Secondary | ICD-10-CM | POA: Diagnosis present

## 2022-08-18 DIAGNOSIS — R6520 Severe sepsis without septic shock: Secondary | ICD-10-CM | POA: Diagnosis present

## 2022-08-18 DIAGNOSIS — E872 Acidosis, unspecified: Secondary | ICD-10-CM | POA: Diagnosis present

## 2022-08-18 DIAGNOSIS — Z8619 Personal history of other infectious and parasitic diseases: Secondary | ICD-10-CM | POA: Diagnosis not present

## 2022-08-18 DIAGNOSIS — K760 Fatty (change of) liver, not elsewhere classified: Secondary | ICD-10-CM | POA: Diagnosis present

## 2022-08-18 DIAGNOSIS — D75838 Other thrombocytosis: Secondary | ICD-10-CM | POA: Diagnosis present

## 2022-08-18 DIAGNOSIS — E860 Dehydration: Secondary | ICD-10-CM | POA: Diagnosis present

## 2022-08-18 DIAGNOSIS — K700 Alcoholic fatty liver: Secondary | ICD-10-CM | POA: Diagnosis present

## 2022-08-18 DIAGNOSIS — F1010 Alcohol abuse, uncomplicated: Secondary | ICD-10-CM

## 2022-08-18 DIAGNOSIS — F17210 Nicotine dependence, cigarettes, uncomplicated: Secondary | ICD-10-CM | POA: Diagnosis present

## 2022-08-18 DIAGNOSIS — Z5329 Procedure and treatment not carried out because of patient's decision for other reasons: Secondary | ICD-10-CM | POA: Diagnosis not present

## 2022-08-18 DIAGNOSIS — K652 Spontaneous bacterial peritonitis: Secondary | ICD-10-CM | POA: Diagnosis present

## 2022-08-18 DIAGNOSIS — E441 Mild protein-calorie malnutrition: Secondary | ICD-10-CM | POA: Diagnosis present

## 2022-08-18 DIAGNOSIS — Z79899 Other long term (current) drug therapy: Secondary | ICD-10-CM | POA: Diagnosis not present

## 2022-08-18 DIAGNOSIS — D509 Iron deficiency anemia, unspecified: Secondary | ICD-10-CM | POA: Diagnosis present

## 2022-08-18 DIAGNOSIS — I10 Essential (primary) hypertension: Secondary | ICD-10-CM | POA: Diagnosis present

## 2022-08-18 DIAGNOSIS — J9811 Atelectasis: Secondary | ICD-10-CM | POA: Diagnosis present

## 2022-08-18 LAB — COMPREHENSIVE METABOLIC PANEL
ALT: 18 U/L (ref 0–44)
AST: 30 U/L (ref 15–41)
Albumin: 2.5 g/dL — ABNORMAL LOW (ref 3.5–5.0)
Alkaline Phosphatase: 85 U/L (ref 38–126)
Anion gap: 19 — ABNORMAL HIGH (ref 5–15)
BUN: 23 mg/dL — ABNORMAL HIGH (ref 6–20)
CO2: 21 mmol/L — ABNORMAL LOW (ref 22–32)
Calcium: 7.3 mg/dL — ABNORMAL LOW (ref 8.9–10.3)
Chloride: 85 mmol/L — ABNORMAL LOW (ref 98–111)
Creatinine, Ser: 1.24 mg/dL (ref 0.61–1.24)
GFR, Estimated: >60 mL/min (ref >60)
Glucose, Bld: 95 mg/dL (ref 70–99)
Potassium: 3.2 mmol/L — ABNORMAL LOW (ref 3.5–5.1)
Sodium: 125 mmol/L — ABNORMAL LOW (ref 135–145)
Total Bilirubin: 2.1 mg/dL — ABNORMAL HIGH (ref 0.3–1.2)
Total Protein: 6.5 g/dL (ref 6.5–8.1)

## 2022-08-18 LAB — CBC WITH DIFFERENTIAL/PLATELET
Abs Immature Granulocytes: 0.13 10*3/uL — ABNORMAL HIGH (ref 0.00–0.07)
Basophils Absolute: 0 10*3/uL (ref 0.0–0.1)
Basophils Relative: 0 %
Eosinophils Absolute: 0 10*3/uL (ref 0.0–0.5)
Eosinophils Relative: 0 %
HCT: 31.3 % — ABNORMAL LOW (ref 39.0–52.0)
Hemoglobin: 10.9 g/dL — ABNORMAL LOW (ref 13.0–17.0)
Immature Granulocytes: 1 %
Lymphocytes Relative: 3 %
Lymphs Abs: 0.6 10*3/uL — ABNORMAL LOW (ref 0.7–4.0)
MCH: 29.8 pg (ref 26.0–34.0)
MCHC: 34.8 g/dL (ref 30.0–36.0)
MCV: 85.5 fL (ref 80.0–100.0)
Monocytes Absolute: 1.6 10*3/uL — ABNORMAL HIGH (ref 0.1–1.0)
Monocytes Relative: 9 %
Neutro Abs: 15.9 10*3/uL — ABNORMAL HIGH (ref 1.7–7.7)
Neutrophils Relative %: 87 %
Platelets: 497 10*3/uL — ABNORMAL HIGH (ref 150–400)
RBC: 3.66 MIL/uL — ABNORMAL LOW (ref 4.22–5.81)
RDW: 21.1 % — ABNORMAL HIGH (ref 11.5–15.5)
WBC: 18.3 10*3/uL — ABNORMAL HIGH (ref 4.0–10.5)
nRBC: 0 % (ref 0.0–0.2)

## 2022-08-18 LAB — ALBUMIN, PLEURAL OR PERITONEAL FLUID: Albumin, Fluid: 2.1 g/dL

## 2022-08-18 LAB — HEMOGLOBIN AND HEMATOCRIT, BLOOD
HCT: 29.6 % — ABNORMAL LOW (ref 39.0–52.0)
Hemoglobin: 10.1 g/dL — ABNORMAL LOW (ref 13.0–17.0)

## 2022-08-18 LAB — LACTIC ACID, PLASMA: Lactic Acid, Venous: 1.2 mmol/L (ref 0.5–1.9)

## 2022-08-18 LAB — PROTEIN, PLEURAL OR PERITONEAL FLUID: Total protein, fluid: 4.2 g/dL

## 2022-08-18 LAB — BODY FLUID CELL COUNT WITH DIFFERENTIAL
Eos, Fluid: 0 %
Lymphs, Fluid: 1 %
Monocyte-Macrophage-Serous Fluid: 4 % — ABNORMAL LOW (ref 50–90)
Neutrophil Count, Fluid: 95 % — ABNORMAL HIGH (ref 0–25)
Total Nucleated Cell Count, Fluid: 4500 cu mm — ABNORMAL HIGH (ref 0–1000)

## 2022-08-18 LAB — PHOSPHORUS: Phosphorus: 4.2 mg/dL (ref 2.5–4.6)

## 2022-08-18 LAB — OSMOLALITY: Osmolality: 295 mOsm/kg (ref 275–295)

## 2022-08-18 LAB — ANAEROBIC CULTURE W GRAM STAIN

## 2022-08-18 LAB — PREALBUMIN: Prealbumin: 12 mg/dL — ABNORMAL LOW (ref 18–38)

## 2022-08-18 LAB — MAGNESIUM: Magnesium: 1.9 mg/dL (ref 1.7–2.4)

## 2022-08-18 LAB — URINE CULTURE

## 2022-08-18 LAB — CULTURE, BLOOD (ROUTINE X 2)

## 2022-08-18 LAB — LACTATE DEHYDROGENASE, PLEURAL OR PERITONEAL FLUID: LD, Fluid: 301 U/L — ABNORMAL HIGH (ref 3–23)

## 2022-08-18 MED ORDER — LABETALOL HCL 5 MG/ML IV SOLN: 5.0000 mg | INTRAVENOUS | Status: DC | PRN

## 2022-08-18 MED ORDER — LACTATED RINGERS IV SOLN: INTRAVENOUS | Status: DC

## 2022-08-18 MED ORDER — IOHEXOL 350 MG/ML SOLN
75.0000 mL | Freq: Once | INTRAVENOUS | Status: AC | PRN
  Administered 2022-08-18: 75 mL via INTRAVENOUS

## 2022-08-18 MED ORDER — POTASSIUM CHLORIDE 10 MEQ/100ML IV SOLN
10.0000 meq | INTRAVENOUS | Status: AC
  Administered 2022-08-18 (×2): 10 meq via INTRAVENOUS
  Filled 2022-08-18 (×3): qty 100

## 2022-08-18 MED ORDER — HYDROMORPHONE HCL 1 MG/ML IJ SOLN
1.0000 mg | INTRAMUSCULAR | Status: DC | PRN
  Administered 2022-08-18 – 2022-08-22 (×12): 1 mg via INTRAVENOUS
  Filled 2022-08-18 (×12): qty 1

## 2022-08-18 MED ORDER — HYDROMORPHONE HCL 1 MG/ML IJ SOLN
0.5000 mg | Freq: Once | INTRAMUSCULAR | Status: AC
  Administered 2022-08-18: 0.5 mg via INTRAVENOUS
  Filled 2022-08-18: qty 0.5

## 2022-08-18 MED ORDER — CARVEDILOL 3.125 MG PO TABS
3.1250 mg | ORAL_TABLET | Freq: Two times a day (BID) | ORAL | Status: DC
  Administered 2022-08-18 – 2022-08-21 (×6): 3.125 mg via ORAL
  Filled 2022-08-18 (×8): qty 1

## 2022-08-18 MED ORDER — POTASSIUM CHLORIDE 10 MEQ/100ML IV SOLN
10.0000 meq | Freq: Once | INTRAVENOUS | Status: AC
  Administered 2022-08-18: 10 meq via INTRAVENOUS

## 2022-08-18 MED ORDER — LIDOCAINE HCL (PF) 1 % IJ SOLN
5.0000 mL | Freq: Once | INTRAMUSCULAR | Status: AC
  Administered 2022-08-18: 5 mL via INTRADERMAL

## 2022-08-18 MED ORDER — HYDROMORPHONE HCL 1 MG/ML IJ SOLN
0.5000 mg | INTRAMUSCULAR | Status: DC | PRN
  Administered 2022-08-18: 0.5 mg via INTRAVENOUS
  Filled 2022-08-18: qty 0.5

## 2022-08-18 NOTE — Plan of Care (Signed)
  Problem: Fluid Volume: Goal: Hemodynamic stability will improve Outcome: Progressing   Problem: Clinical Measurements: Goal: Diagnostic test results will improve Outcome: Progressing Goal: Signs and symptoms of infection will decrease Outcome: Progressing   Problem: Education: Goal: Knowledge of General Education information will improve Description: Including pain rating scale, medication(s)/side effects and non-pharmacologic comfort measures Outcome: Progressing   Problem: Clinical Measurements: Goal: Ability to maintain clinical measurements within normal limits will improve Outcome: Progressing   Problem: Activity: Goal: Risk for activity intolerance will decrease Outcome: Progressing   Problem: Coping: Goal: Level of anxiety will decrease Outcome: Progressing   Problem: Elimination: Goal: Will not experience complications related to bowel motility Outcome: Progressing Goal: Will not experience complications related to urinary retention Outcome: Progressing   Problem: Pain Managment: Goal: General experience of comfort will improve Outcome: Progressing   Problem: Safety: Goal: Ability to remain free from injury will improve Outcome: Progressing   Problem: Skin Integrity: Goal: Risk for impaired skin integrity will decrease Outcome: Progressing   Problem: Health Behavior/Discharge Planning: Goal: Ability to manage health-related needs will improve Outcome: Not Progressing   Problem: Clinical Measurements: Goal: Will remain free from infection Outcome: Not Progressing   Problem: Nutrition: Goal: Adequate nutrition will be maintained Outcome: Not Progressing

## 2022-08-18 NOTE — Progress Notes (Addendum)
PROGRESS NOTE   Samuel Blevins  QQV:[id number] DOB: 1965-10-05 DOA: 08/17/2022 PCP: [name], MD   Chief Complaint  Patient presents with   Abdominal Pain   Emesis   Level of care: Progressive  Brief Admission History:  57 y.o. male with medical history significant of alcohol abuse, tobacco abuse, chronic pancreatitis, chronic pain, hx of seizure d/o, homeless.     Presents with upper abdominal pain for the past 3 days.  Last alcohol intake was this morning.  Today has emesis and abdominal distention  History of alcohol abuse and chronic pancreatitis.  Pt reported nausea and vomiting over the past 2 days.  He reports an episode of bloody bowel movement.  No prior melena no shortness of breath or chest pain    Recently hospitalized for acute pancreatitis. He continues to consume alcohol.   Still smoking and drinking about 1 fifth per day and homeless  He was admitted with acute alcohol induced pancreatitis.        Assessment and Plan:  Acute alcohol induced pancreatitis  - pt continues with heavy alcohol consumption despite recurrent bout of pancreatitis - continue NPO status for now - aggressive IV fluid hydration  - IV pain and nausea management  - follow lipase until clinically improved - low threshold to transfer to higher level care if decompensates  Chronic alcohol abuser - high risk for acute alcohol withdrawal  - agree with CIWA protocol with supplemental vitamins added  Leukocytosis  - possibly reactive to acute pancreatitis  - there was some concern for SBP on admission - he is being treated for pancreatitis and remains on IV antibiotics - check CBC/diff in AM   Hypokalemia and hypomagnesemia  - IV replacement ordered - follow  Hyponatremia  - multifactorial given acute dehydration, chronic alcoholism and liver disease  - hydrating with isotonic fluid - recheck labs in AM   Reactive thrombocytosis - recheck CBC / diff in AM  - supportive  management   Question of SBP per admission work up - IR was consulted on admission for paracentesis with fluid testing - empirically was started on broad spectrum antibiotic coverage    History of alcohol withdrawal seizure - monitoring closely for acute alcohol withdrawal - maintain on CIWA protocol   Normocytic Anemia  - anticipating Hg will trend down with IV fluid hydration - Monitoring CBC   Homelessness - TOC consulted for assistance with resources   Tobacco User - nicotine patch ordered  DVT prophylaxis: SCDs Code Status: Full  Family Communication:     Consultants:  IR  Procedures:  Paracentesis pending  Antimicrobials:  Cefepime 6/22>> metronidazole 6/22>>   Subjective: Pt continues to complain of abdominal pain and discomfort.   Objective: Vitals:   08/18/22 0004 08/18/22 0324 08/18/22 0735 08/18/22 0845  BP: (!) 138/91 (!) 144/97 (!) 150/100 (!) 144/98  Pulse: (!) 127 (!) 124 (!) 119 (!) 122  Resp: 18 19 (!) 22   Temp: 98.2 F (36.8 C) 98.2 F (36.8 C) 97.8 F (36.6 C)   TempSrc: Oral Oral    SpO2: 93% 94% 95%   Weight:      Height:        Intake/Output Summary (Last 24 hours) at 08/18/2022 1029 Last data filed at 08/18/2022 0700 Gross per 24 hour  Intake 1867.69 ml  Output 300 ml  Net 1567.69 ml   Filed Weights   08/17/22 1615  Weight: 74.8 kg   Examination:  General exam: Appears calm and  comfortable  Respiratory system: Clear to auscultation. Respiratory effort normal. Cardiovascular system: normal S1 & S2 heard. No JVD, murmurs, rubs, gallops or clicks. No pedal edema. Gastrointestinal system: Abdomen is nondistended, soft and nontender. No organomegaly or masses felt. Normal bowel sounds heard. Central nervous system: Alert and oriented. No focal neurological deficits. Extremities: Symmetric 5 x 5 power. Skin: No rashes, lesions or ulcers. Psychiatry: Judgement and insight appear normal. Mood & affect appropriate.   Data  Reviewed: I have personally reviewed following labs and imaging studies  CBC: Recent Labs  Lab 08/17/22 1623 08/17/22 1746 08/17/22 2040 2022-09-17 0245  WBC 15.6*  --  14.2* 18.3*  NEUTROABS 14.2*  --   --  15.9*  HGB 13.1 13.9 11.3* 10.9*  HCT 37.9* 41.0 34.0* 31.3*  MCV 86.9  --  88.1 85.5  PLT 589*  --  469* 497*    Basic Metabolic Panel: Recent Labs  Lab 08/17/22 1623 08/17/22 1746 08/17/22 2040 September 17, 2022 0245  NA 125* 125* 126* 125*  K 3.9 3.4* 3.0* 3.2*  CL 80* 82* 84* 85*  CO2 17*  --  21* 21*  GLUCOSE 98 100* 89 95  BUN 23* 30* 21* 23*  CREATININE 1.17 1.20 1.00 1.24  CALCIUM 7.8*  --  7.3* 7.3*  MG  --   --  1.2* 1.9  PHOS  --   --  4.3 4.2    CBG: Recent Labs  Lab 08/17/22 2150  GLUCAP 108*    Recent Results (from the past 240 hour(s))  Culture, blood (routine x 2)     Status: None (Preliminary result)   Collection Time: 08/17/22  5:17 PM   Specimen: BLOOD RIGHT ARM  Result Value Ref Range Status   Specimen Description BLOOD RIGHT ARM  Final   Special Requests   Final    BOTTLES DRAWN AEROBIC AND ANAEROBIC Blood Culture adequate volume   Culture   Final    NO GROWTH < 24 HOURS Performed at [facility], [address], [place], [place] [address]    Report Status PENDING  Incomplete  Culture, blood (routine x 2)     Status: None (Preliminary result)   Collection Time: 08/17/22  5:56 PM   Specimen: BLOOD LEFT ARM  Result Value Ref Range Status   Specimen Description BLOOD LEFT ARM  Final   Special Requests   Final    BOTTLES DRAWN AEROBIC AND ANAEROBIC Blood Culture results may not be optimal due to an excessive volume of blood received in culture bottles   Culture   Final    NO GROWTH < 24 HOURS Performed at [facility], [address] [place], [place] [address]    Report Status PENDING  Incomplete  Urine Culture     Status: None (Preliminary result)   Collection Time: 08/17/22  6:45 PM   Specimen: Urine, Random  Result Value  Ref Range Status   Specimen Description URINE, RANDOM  Final   Special Requests   Final    URINE, CLEAN CATCH Performed at [facility], [address] [place], [place] [address]    Culture PENDING  Incomplete   Report Status PENDING  Incomplete     Radiology Studies: EEG adult  Result Date: 09/17/22 Charlsie Quest, MD     2022-09-17  7:37 AM Patient Name: Samuel Blevins MRN: [id number] Epilepsy Attending: Charlsie Quest Referring Physician/Provider: Therisa Doyne, MD Date: 2022/09/17 Duration: 27.58 mins Patient history: 57yo Judie Petit  with h/o seizures getting eeg to evaluate for seizure. Level of alertness: Awake AEDs during EEG study: None Technical aspects: This EEG study was done with scalp electrodes positioned according to the 10-20 International system of electrode placement. Electrical activity was reviewed with band pass filter of 1-70Hz , sensitivity of 7 uV/mm, display speed of 51mm/sec with a 60Hz  notched filter applied as appropriate. EEG data were recorded continuously and digitally stored.  Video monitoring was available and reviewed as appropriate. Description: No clear posterior dominant rhythm was seen. EEG showed continuous generalized 3 to 6 Hz theta-delta slowing. Photic driving was not seen during photic stimulation. Hyperventilation was not performed.   Of note, eeg was technically difficult due to significant myogenic artifact. ABNORMALITY - Continuous slow, generalized IMPRESSION: This technically difficult study is suggestive of moderate diffuse encephalopathy, nonspecific etiology. No seizures or epileptiform discharges were seen throughout the recording. Please note lack of epileptiform discharges during interictal EEG does not exclude the diagnosis of epilepsy. Priyanka Annabelle Harman   CT HEAD WO CONTRAST ( )  Result Date: 08/17/2022 CLINICAL DATA:  Head trauma, coagulopathy EXAM: CT HEAD WITHOUT CONTRAST TECHNIQUE: Contiguous axial images were obtained from  the base of the skull through the vertex without intravenous contrast. RADIATION DOSE REDUCTION: This exam was performed according to the departmental dose-optimization program which includes automated exposure control, adjustment of the mA and/or kV according to patient size and/or use of iterative reconstruction technique. COMPARISON:  06/06/2022 FINDINGS: Brain: There is no mass, hemorrhage or extra-axial collection. The size and configuration of the ventricles and extra-axial CSF spaces are normal. The brain parenchyma is normal, without acute or chronic infarction. Vascular: No abnormal hyperdensity of the major intracranial arteries or dural venous sinuses. No intracranial atherosclerosis. Skull: The visualized skull base, calvarium and extracranial soft tissues are normal. Sinuses/Orbits: No fluid levels or advanced mucosal thickening of the visualized paranasal sinuses. No mastoid or middle ear effusion. The orbits are normal. IMPRESSION: Normal head CT. Electronically Signed   By: Deatra Robinson M.D.   On: 08/17/2022 22:52   DG CHEST PORT 1 VIEW  Result Date: 08/17/2022 CLINICAL DATA:  Upper abdominal pain. EXAM: PORTABLE CHEST 1 VIEW COMPARISON:  Jul 11, 2022 FINDINGS: The heart size and mediastinal contours are within normal limits. Mild, stable atelectatic changes are seen within the bilateral lung bases. There is no evidence of focal consolidation, pleural effusion or pneumothorax. The visualized skeletal structures are unremarkable. IMPRESSION: Mild, stable bibasilar atelectasis. Electronically Signed   By: Aram Candela M.D.   On: 08/17/2022 20:13   CT ABDOMEN PELVIS W CONTRAST  Result Date: 08/17/2022 CLINICAL DATA:  Three day history of upper abdominal pain associated with emesis and abdominal distention EXAM: CT ABDOMEN AND PELVIS WITH CONTRAST TECHNIQUE: Multidetector CT imaging of the abdomen and pelvis was performed using the standard protocol following bolus administration of  intravenous contrast. RADIATION DOSE REDUCTION: This exam was performed according to the departmental dose-optimization program which includes automated exposure control, adjustment of the mA and/or kV according to patient size and/or use of iterative reconstruction technique. CONTRAST:  75mL OMNIPAQUE IOHEXOL 350 MG/ML SOLN COMPARISON:  CT abdomen and pelvis dated 07/11/2022 FINDINGS: Lower chest: No focal consolidation or pulmonary nodule in the lung bases. No pleural effusion or pneumothorax demonstrated. Partially imaged heart size is normal. Hepatobiliary: Diffuse parenchymal hypoattenuation can be seen with hepatic steatosis. No intra or extrahepatic biliary ductal dilation. Normal gallbladder. Pancreas: Decreased mild peripancreatic stranding surrounding the head and uncinate process. No focal lesions  or main ductal dilation. Spleen: Normal in size without focal abnormality. Adrenals/Urinary Tract: No adrenal nodules. No suspicious renal mass, calculi or hydronephrosis. No focal bladder wall thickening. Stomach/Bowel: Normal appearance of the stomach. No evidence of bowel wall thickening, distention, or inflammatory changes. Normal appendix. Vascular/Lymphatic: Aortic atherosclerosis. No enlarged abdominal or pelvic lymph nodes. Reproductive: Enlargement of the prostate with median lobe hypertrophy. Other: New moderate volume ascites. No free air or fluid collection. Musculoskeletal: No acute or abnormal lytic or blastic osseous lesions. Postsurgical changes of the paraumbilical region. Degenerative changes of the lumbar spine, most pronounced at L5-S1. IMPRESSION: 1. New moderate volume ascites. 2. Decreased mild peripancreatic stranding surrounding the head and uncinate process, which may represent improving pancreatitis. 3. Hepatic steatosis. 4. Enlargement of the prostate with median lobe hypertrophy. 5.  Aortic Atherosclerosis (ICD10-I70.0). Electronically Signed   By: Agustin Cree M.D.   On: 08/17/2022  18:55    Scheduled Meds:  folic acid  1 mg Oral Daily   guaiFENesin  600 mg Oral BID   lipase/protease/amylase  12,000 Units Oral TID AC   multivitamin with minerals  1 tablet Oral Daily   nicotine  21 mg Transdermal Daily   pantoprazole (PROTONIX) IV  40 mg Intravenous QHS   thiamine  100 mg Oral Daily   Or   thiamine  100 mg Intravenous Daily   Continuous Infusions:  ceFEPime (MAXIPIME) IV Stopped (08/18/22 0401)   lactated ringers 150 mL/hr at 08/18/22 0858   metronidazole 100 mL/hr at 08/18/22 0600   potassium chloride 10 mEq (08/18/22 0901)     LOS: 0 days   Time spent: 44 mins  Shawntia Mangal Laural Benes, MD How to contact the [facility] Attending or Consulting provider 7A - 7P or covering provider during after hours 7P -7A, for this patient?  Check the care team in [facility] and look for a) attending/consulting TRH provider listed and b) the [facility] team listed Log into [email or web address] and use [place]'s universal password to access. If you do not have the password, please contact the hospital operator. Locate the [facility] provider you are looking for under [facility] and page to a number that you can be directly reached. If you still have difficulty reaching the provider, please page the [facility] (Director on Call) for the Hospitalists listed on amion for assistance.  08/18/2022, 10:29 AM

## 2022-08-18 NOTE — Procedures (Addendum)
Patient Name: Samuel Blevins  MRN: [id number]  Epilepsy Attending: Charlsie Quest  Referring Physician/Provider: Therisa Doyne, MD  Date: 08/18/2022 Duration: 27.58 mins  Patient history: 57yo M with h/o seizures getting eeg to evaluate for seizure.  Level of alertness: Awake  AEDs during EEG study: None  Technical aspects: This EEG study was done with scalp electrodes positioned according to the 10-20 International system of electrode placement. Electrical activity was reviewed with band pass filter of 1-70Hz , sensitivity of 7 uV/mm, display speed of 49mm/sec with a 60Hz  notched filter applied as appropriate. EEG data were recorded continuously and digitally stored.  Video monitoring was available and reviewed as appropriate.  Description: No clear posterior dominant rhythm was seen. EEG showed continuous generalized 3 to 6 Hz theta-delta slowing. Photic driving was not seen during photic stimulation. Hyperventilation was not performed.     Of note, eeg was technically difficult due to significant myogenic artifact.  ABNORMALITY - Continuous slow, generalized  IMPRESSION: This technically difficult study is suggestive of moderate diffuse encephalopathy, nonspecific etiology. No seizures or epileptiform discharges were seen throughout the recording.  Please note lack of epileptiform discharges during interictal EEG does not exclude the diagnosis of epilepsy.   Khristy Kalan Annabelle Harman

## 2022-08-18 NOTE — Progress Notes (Addendum)
08/18/2022 5:26 PM  Came back to reassess patient during evening rounds and I'm concerned that his abdominal exam seems worse than it was this morning.  He is guarding heavily with a hard tender abdomen and will not tolerate light touch to the abdomen. Will order another CT abd / pelvis STAT with contrast to further evaluate for any changes or worsening of his pancreatitis.  His vitals so far holding stable. Will need to continue to monitor closely.  Conferenced with the bedside RN.  Update: 7pm, CT still not done, I signed out to night on call MD to follow up on CT results.   Maryln Manuel, MD  How to contact the [facility] Attending or Consulting provider 7A - 7P or covering provider during after hours 7P -7A, for this patient?  Check the care team in [facility] and look for a) attending/consulting TRH provider listed and b) the [facility] team listed Log into [email or web address] and use [place]'s universal password to access. If you do not have the password, please contact the hospital operator. Locate the [facility] provider you are looking for under [facility] and page to a number that you can be directly reached. If you still have difficulty reaching the provider, please page the [facility] (Director on Call) for the Hospitalists listed on amion for assistance.

## 2022-08-18 NOTE — Evaluation (Signed)
Occupational Therapy Evaluation Patient Details Name: Samuel Blevins MRN: [id number] DOB: 07-08-1965 Today's Date: 08/18/2022   History of Present Illness 57 y.o. male with medical history significant of alcohol abuse, tobacco abuse, chronic pancreatitis, chronic pain, hx of seizure do, homeless.  Presented with  abd pain.   Clinical Impression   Patient admitted for the diagnosis above.  PTA he lives at a local homeless shelter, and has had several admits for a similar diagnosis.  Abdominal pain is limiting function and mobility this date, and he is hype focused on finding his pants.  OT will follow in the acute setting to address deficits, and Patient will benefit from continued inpatient follow up therapy, <3 hours/day.  Hopefully with mobility and a reduction in pain, he could transition directly back to his shelter.        Recommendations for follow up therapy are one component of a multi-disciplinary discharge planning process, led by the attending physician.  Recommendations may be updated based on patient status, additional functional criteria and insurance authorization.   Assistance Recommended at Discharge Frequent or constant Supervision/Assistance  Patient can return home with the following Help with stairs or ramp for entrance;A lot of help with walking and/or transfers;A lot of help with bathing/dressing/bathroom    Functional Status Assessment  Patient has had a recent decline in their functional status and demonstrates the ability to make significant improvements in function in a reasonable and predictable amount of time.  Equipment Recommendations  None recommended by OT    Recommendations for Other Services       Precautions / Restrictions Precautions Precautions: Fall Restrictions Weight Bearing Restrictions: No      Mobility Bed Mobility Overal bed mobility: Needs Assistance Bed Mobility: Supine to Sit     Supine to sit: Supervision, HOB elevated           Transfers Overall transfer level: Needs assistance Equipment used: Rollator (4 wheels) Transfers: Sit to/from Stand, Bed to chair/wheelchair/BSC Sit to Stand: Supervision     Step pivot transfers: Min assist, Mod assist     General transfer comment: abdominal pain      Balance Overall balance assessment: Needs assistance Sitting-balance support: Feet supported Sitting balance-Leahy Scale: Fair     Standing balance support: Reliant on assistive device for balance Standing balance-Leahy Scale: Poor                             ADL either performed or assessed with clinical judgement   ADL Overall ADL's : Needs assistance/impaired     Grooming: Wash/dry hands;Wash/dry face;Set up;Sitting   Upper Body Bathing: Set up;Sitting   Lower Body Bathing: Moderate assistance;Sit to/from stand   Upper Body Dressing : Minimal assistance;Sitting   Lower Body Dressing: Moderate assistance;Sit to/from stand   Toilet Transfer: Moderate assistance;BSC/3in1;Stand-pivot                   Vision Patient Visual Report: No change from baseline       Perception     Praxis      Pertinent Vitals/Pain Pain Assessment Pain Assessment: Faces Faces Pain Scale: Hurts whole lot Pain Location: abdominal Pain Descriptors / Indicators: Sharp, Grimacing Pain Intervention(s): Limited activity within patient's tolerance     Hand Dominance Right   Extremity/Trunk Assessment Upper Extremity Assessment Upper Extremity Assessment: Generalized weakness   Lower Extremity Assessment Lower Extremity Assessment: Defer to PT evaluation   Cervical / Trunk Assessment  Cervical / Trunk Assessment: Kyphotic   Communication Communication Communication: No difficulties   Cognition Arousal/Alertness: Awake/alert Behavior During Therapy: Flat affect, Restless Overall Cognitive Status: Difficult to assess                                 General Comments:  Following commands, focused on findng his pants.  Delayed responses.  Safety concerns.     General Comments       Exercises     Shoulder Instructions      Home Living Family/patient expects to be discharged to:: Shelter/Homeless                                 Additional Comments: Was staying a [employer], plans to return at discharge. Has rollator and reports SPC as well, stays on first level and has elevator access if needing to go upstairs      Prior Functioning/Environment Prior Level of Function : Needs assist             Mobility Comments: ambulatory with rollator at baseline, but reports recent weakness with falls ADLs Comments: modified independent        OT Problem List: Decreased strength;Decreased activity tolerance;Impaired balance (sitting and/or standing);Decreased safety awareness;Pain      OT Treatment/Interventions: Self-care/ADL training;Therapeutic activities;Patient/family education;Balance training;DME and/or AE instruction    OT Goals(Current goals can be found in the care plan section) Acute Rehab OT Goals Patient Stated Goal: Find my pants OT Goal Formulation: With patient Time For Goal Achievement: 09/02/22 Potential to Achieve Goals: Fair ADL Goals Pt Will Perform Grooming: with modified independence;standing Pt Will Perform Lower Body Dressing: with modified independence;sit to/from stand Pt Will Transfer to Toilet: with modified independence;ambulating;regular height toilet  OT Frequency: Min 2X/week    Co-evaluation              AM-PAC OT "6 Clicks" Daily Activity     Outcome Measure Help from another person eating meals?: None Help from another person taking care of personal grooming?: A Little Help from another person toileting, which includes using toliet, bedpan, or urinal?: A Lot Help from another person bathing (including washing, rinsing, drying)?: A Lot Help from another person to put on and taking  off regular upper body clothing?: A Little Help from another person to put on and taking off regular lower body clothing?: A Lot 6 Click Score: 16   End of Session Equipment Utilized During Treatment: Rollator (4 wheels) Nurse Communication: Mobility status  Activity Tolerance: Patient limited by pain Patient left: in chair;with call bell/phone within reach;with chair alarm set  OT Visit Diagnosis: Unsteadiness on feet (R26.81);Muscle weakness (generalized) (M62.81);History of falling (Z91.81);Pain                Time: 1610-9604 OT Time Calculation (min): 16 min Charges:  OT General Charges $OT Visit: 1 Visit OT Evaluation $OT Eval Moderate Complexity: 1 Mod  08/18/2022  RP, OTR/L  Acute Rehabilitation Services  Office:  [phone]   Suzanna Obey 08/18/2022, 7:59 AM

## 2022-08-18 NOTE — Progress Notes (Signed)
EEG complete - results pending 

## 2022-08-18 NOTE — Progress Notes (Signed)
Initial Nutrition Assessment  DOCUMENTATION CODES:   Not applicable  INTERVENTION:  - Advance diet as tolerated.   NUTRITION DIAGNOSIS:   Inadequate oral intake related to inability to eat as evidenced by NPO status.  GOAL:   Patient will meet greater than or equal to 90% of their needs  MONITOR:   Diet advancement  REASON FOR ASSESSMENT:   Consult Assessment of nutrition requirement/status  ASSESSMENT:   57 y.o. male admits related to abdominal pain and emesis. PMH includes: alcohol abuse, tobacco abuse, chronic pancreatitis, seizures. Pt is currently receiving medical management related to acute alcohol induced pancreatitis.  Meds reviewed: folic acid, MVI, thiamine. Labs reviewed: Na low, K low, BUN elevated.   No answer to room phone. Pt is currently NPO. No significant wt loss per record. RD will continue to monitor for diet advancement and add supplements once diet is advanced.   NUTRITION - FOCUSED PHYSICAL EXAM:  Remote assessment.  Diet Order:   Diet Order             Diet NPO time specified Except for: Sips with Meds, Ice Chips  Diet effective now                   EDUCATION NEEDS:   Not appropriate for education at this time  Skin:  Skin Assessment: Reviewed RN Assessment  Last BM:  6/23 - type 4  Height:   Ht Readings from Last 1 Encounters:  08/17/22 6\' 1"  (1.854 m)    Weight:   Wt Readings from Last 1 Encounters:  08/17/22 74.8 kg    Ideal Body Weight:     BMI:  Body mass index is 21.77 kg/m.  Estimated Nutritional Needs:   Kcal:  1610-9604 kcals  Protein:  90-115 gm  Fluid:  >/= 1.8 L  Bethann Humble, RD, LDN, CNSC.

## 2022-08-18 NOTE — Progress Notes (Signed)
   08/18/22 0735  Assess: MEWS Score  Temp 97.8 F (36.6 C)  BP (!) 150/100  MAP (mmHg) 115  Pulse Rate (!) 119  ECG Heart Rate (!) 118  Resp (!) 22  Level of Consciousness Alert  SpO2 95 %  O2 Device Room Air  Assess: MEWS Score  MEWS Temp 0  MEWS Systolic 0  MEWS Pulse 2  MEWS RR 1  MEWS LOC 0  MEWS Score 3  MEWS Score Color Yellow  Assess: if the MEWS score is Yellow or Red  Were vital signs taken at a resting state? Yes  Focused Assessment No change from prior assessment  Does the patient meet 2 or more of the SIRS criteria? No  MEWS guidelines implemented  Yes, yellow  Treat  MEWS Interventions Considered administering scheduled or prn medications/treatments as ordered  Take Vital Signs  Increase Vital Sign Frequency  Yellow: Q2hr x1, continue Q4hrs until patient remains green for 12hrs  Escalate  MEWS: Escalate Yellow: Discuss with charge nurse and consider notifying provider and/or RRT  Notify: Charge Nurse/RN  Name of Charge Nurse/RN Notified Lafonda Mosses, RN  Provider Notification  Provider Name/Title Laural Benes MD  Date Provider Notified 08/18/22  Time Provider Notified 0800  Method of Notification Face-to-face  Notification Reason Other (Comment) (Yellow MEWS)  Provider response At bedside  Date of Provider Response 08/18/22  Time of Provider Response 0810  Assess: SIRS CRITERIA  SIRS Temperature  0  SIRS Pulse 1  SIRS Respirations  1  SIRS WBC 1  SIRS Score Sum  3

## 2022-08-18 NOTE — Evaluation (Addendum)
Physical Therapy Evaluation Patient Details Name: Bryndan Bilyk MRN: [id number] DOB: 1966/02/05 Today's Date: 08/18/2022  History of Present Illness  57 y.o. male with medical history significant of alcohol abuse, tobacco abuse, chronic pancreatitis, chronic pain, hx of seizure do, homeless.  Presented with  abd pain.   Clinical Impression  Pt admitted with above diagnosis. PTA pt living at local homeless shelter, mod I mobility with rollator. Pt currently with functional limitations due to the deficits listed below (see PT Problem List). On eval, pt required supervision sit to stand with rollator. Declining amb due to being NPO for procedure, stating "I don't want to do anything until I get some water." Pt will benefit from acute skilled PT to increase their independence and safety with mobility to allow discharge.  Pt would benefit from further therapy intervention in inpatient setting < 3 hours/day prior to returning to shelter.         Recommendations for follow up therapy are one component of a multi-disciplinary discharge planning process, led by the attending physician.  Recommendations may be updated based on patient status, additional functional criteria and insurance authorization.  Follow Up Recommendations Can patient physically be transported by private vehicle: Yes     Assistance Recommended at Discharge PRN  Patient can return home with the following  A little help with bathing/dressing/bathroom;A little help with walking and/or transfers    Equipment Recommendations None recommended by PT  Recommendations for Other Services       Functional Status Assessment Patient has had a recent decline in their functional status and demonstrates the ability to make significant improvements in function in a reasonable and predictable amount of time.     Precautions / Restrictions Precautions Precautions: Fall Restrictions Weight Bearing Restrictions: No      Mobility   Bed Mobility               General bed mobility comments: Pt in recliner.    Transfers Overall transfer level: Needs assistance Equipment used: Rollator (4 wheels) Transfers: Sit to/from Stand Sit to Stand: Supervision           General transfer comment: increased time    Ambulation/Gait               General Gait Details: declined due to NPO awaiting procedure  Stairs            Wheelchair Mobility    Modified Rankin (Stroke Patients Only)       Balance Overall balance assessment: Needs assistance Sitting-balance support: Feet supported Sitting balance-Leahy Scale: Fair     Standing balance support: Reliant on assistive device for balance, During functional activity, Bilateral upper extremity supported Standing balance-Leahy Scale: Poor                               Pertinent Vitals/Pain Pain Assessment Pain Assessment: Faces Faces Pain Scale: Hurts even more Pain Location: abdominal Pain Descriptors / Indicators: Discomfort, Grimacing, Guarding Pain Intervention(s): Limited activity within patient's tolerance    Home Living Family/patient expects to be discharged to:: Shelter/Homeless                   Additional Comments: Was staying a [employer], plans to return at discharge. Has rollator and reports SPC as well, stays on first level and has elevator access if needing to go upstairs    Prior Function Prior Level of Function : Needs assist  Mobility Comments: ambulatory with rollator at baseline, but reports recent weakness with falls ADLs Comments: modified independent     Hand Dominance   Dominant Hand: Right    Extremity/Trunk Assessment   Upper Extremity Assessment Upper Extremity Assessment: Generalized weakness    Lower Extremity Assessment Lower Extremity Assessment: Generalized weakness    Cervical / Trunk Assessment Cervical / Trunk Assessment: Kyphotic  Communication    Communication: No difficulties  Cognition Arousal/Alertness: Awake/alert Behavior During Therapy: Flat affect Overall Cognitive Status: No family/caregiver present to determine baseline cognitive functioning Area of Impairment: Problem solving, Safety/judgement, Awareness                         Safety/Judgement: Decreased awareness of safety Awareness: Emergent Problem Solving: Slow processing          General Comments General comments (skin integrity, edema, etc.): HR up to 119    Exercises     Assessment/Plan    PT Assessment Patient needs continued PT services  PT Problem List Decreased strength;Decreased balance;Decreased cognition;Pain;Decreased mobility;Decreased activity tolerance       PT Treatment Interventions Gait training;Functional mobility training;Balance training;Patient/family education;Therapeutic activities;Therapeutic exercise    PT Goals (Current goals can be found in the Care Plan section)  Acute Rehab PT Goals Patient Stated Goal: decrease pain PT Goal Formulation: With patient Time For Goal Achievement: 09/01/22 Potential to Achieve Goals: Good    Frequency Min 2X/week     Co-evaluation               AM-PAC PT "6 Clicks" Mobility  Outcome Measure Help needed turning from your back to your side while in a flat bed without using bedrails?: A Little Help needed moving from lying on your back to sitting on the side of a flat bed without using bedrails?: A Little Help needed moving to and from a bed to a chair (including a wheelchair)?: A Little Help needed standing up from a chair using your arms (e.g., wheelchair or bedside chair)?: A Little Help needed to walk in hospital room?: A Little Help needed climbing 3-5 steps with a railing? : A Lot 6 Click Score: 17    End of Session Equipment Utilized During Treatment: Gait belt Activity Tolerance: Patient limited by pain Patient left: in chair;with call bell/phone within  reach;with chair alarm set Nurse Communication: Mobility status PT Visit Diagnosis: Difficulty in walking, not elsewhere classified (R26.2);Muscle weakness (generalized) (M62.81)    Time: 1610-9604 PT Time Calculation (min) (ACUTE ONLY): 12 min   Charges:   PT Evaluation $PT Eval Moderate Complexity: 1 Mod          Ferd Glassing., PT  Office # [phone]   Ilda Foil 08/18/2022, 9:36 AM

## 2022-08-18 NOTE — Hospital Course (Signed)
57 y.o. male with medical history significant of alcohol abuse, tobacco abuse, chronic pancreatitis, chronic pain, hx of seizure d/o, homeless.     Presents with upper abdominal pain for the past 3 days.  Last alcohol intake was this morning.  Today has emesis and abdominal distention  History of alcohol abuse and chronic pancreatitis.  Pt reported nausea and vomiting over the past 2 days.  He reports an episode of bloody bowel movement.  No prior melena no shortness of breath or chest pain    Recently hospitalized for acute pancreatitis. He continues to consume alcohol.   Still smoking and drinking about 1 fifth per day and homeless  He was admitted with acute alcohol induced pancreatitis.

## 2022-08-18 NOTE — Procedures (Signed)
PROCEDURE SUMMARY:  Successful image-guided paracentesis from the right upper abdomen.  Yielded 1.1 liters of bloody fluid. Procedure stopped at this amount due to bloody fluid and inability to monitor BP during procedure due to presence of bilateral IVs. Very small amount of residual perihepatic fluid remains which is not amenable to repeat paracentesis at this time. Patient denied s/s of hypotension during and after procedure. No immediate complications.  EBL < 1 mL Patient tolerated well.   Specimen was sent for labs.  Please see imaging section of Epic for full dictation.  Villa Herb PA-C 08/18/2022 11:34 AM

## 2022-08-19 DIAGNOSIS — K852 Alcohol induced acute pancreatitis without necrosis or infection: Secondary | ICD-10-CM | POA: Diagnosis not present

## 2022-08-19 LAB — BODY FLUID CULTURE W GRAM STAIN

## 2022-08-19 LAB — COMPREHENSIVE METABOLIC PANEL
ALT: 14 U/L (ref 0–44)
AST: 27 U/L (ref 15–41)
Albumin: 2 g/dL — ABNORMAL LOW (ref 3.5–5.0)
Alkaline Phosphatase: 75 U/L (ref 38–126)
Anion gap: 16 — ABNORMAL HIGH (ref 5–15)
BUN: 22 mg/dL — ABNORMAL HIGH (ref 6–20)
CO2: 22 mmol/L (ref 22–32)
Calcium: 7.2 mg/dL — ABNORMAL LOW (ref 8.9–10.3)
Chloride: 88 mmol/L — ABNORMAL LOW (ref 98–111)
Creatinine, Ser: 0.97 mg/dL (ref 0.61–1.24)
GFR, Estimated: >60 mL/min (ref >60)
Glucose, Bld: 87 mg/dL (ref 70–99)
Potassium: 3.2 mmol/L — ABNORMAL LOW (ref 3.5–5.1)
Sodium: 126 mmol/L — ABNORMAL LOW (ref 135–145)
Total Bilirubin: 1.3 mg/dL — ABNORMAL HIGH (ref 0.3–1.2)
Total Protein: 5.7 g/dL — ABNORMAL LOW (ref 6.5–8.1)

## 2022-08-19 LAB — CBC WITH DIFFERENTIAL/PLATELET
Abs Immature Granulocytes: 0.13 10*3/uL — ABNORMAL HIGH (ref 0.00–0.07)
Basophils Absolute: 0 10*3/uL (ref 0.0–0.1)
Basophils Relative: 0 %
Eosinophils Absolute: 0.1 10*3/uL (ref 0.0–0.5)
Eosinophils Relative: 0 %
HCT: 28 % — ABNORMAL LOW (ref 39.0–52.0)
Hemoglobin: 9.5 g/dL — ABNORMAL LOW (ref 13.0–17.0)
Immature Granulocytes: 1 %
Lymphocytes Relative: 3 %
Lymphs Abs: 0.6 10*3/uL — ABNORMAL LOW (ref 0.7–4.0)
MCH: 30.1 pg (ref 26.0–34.0)
MCHC: 33.9 g/dL (ref 30.0–36.0)
MCV: 88.6 fL (ref 80.0–100.0)
Monocytes Absolute: 1.4 10*3/uL — ABNORMAL HIGH (ref 0.1–1.0)
Monocytes Relative: 8 %
Neutro Abs: 15.3 10*3/uL — ABNORMAL HIGH (ref 1.7–7.7)
Neutrophils Relative %: 88 %
Platelets: 433 10*3/uL — ABNORMAL HIGH (ref 150–400)
RBC: 3.16 MIL/uL — ABNORMAL LOW (ref 4.22–5.81)
RDW: 21.2 % — ABNORMAL HIGH (ref 11.5–15.5)
WBC: 17.6 10*3/uL — ABNORMAL HIGH (ref 4.0–10.5)
nRBC: 0 % (ref 0.0–0.2)

## 2022-08-19 LAB — CULTURE, BLOOD (ROUTINE X 2)
Culture: NO GROWTH
Special Requests: ADEQUATE

## 2022-08-19 LAB — URINE CULTURE

## 2022-08-19 LAB — LIPASE, BLOOD: Lipase: 285 U/L — ABNORMAL HIGH (ref 11–51)

## 2022-08-19 LAB — MAGNESIUM: Magnesium: 1.6 mg/dL — ABNORMAL LOW (ref 1.7–2.4)

## 2022-08-19 MED ORDER — POTASSIUM CHLORIDE 10 MEQ/100ML IV SOLN
10.0000 meq | INTRAVENOUS | Status: AC
  Administered 2022-08-19 (×4): 10 meq via INTRAVENOUS
  Filled 2022-08-19 (×2): qty 100

## 2022-08-19 MED ORDER — MAGNESIUM SULFATE 2 GM/50ML IV SOLN
2.0000 g | Freq: Once | INTRAVENOUS | Status: AC
  Administered 2022-08-19: 2 g via INTRAVENOUS
  Filled 2022-08-19: qty 50

## 2022-08-19 MED ORDER — SODIUM CHLORIDE 0.9 % IV SOLN: INTRAVENOUS | Status: DC

## 2022-08-19 NOTE — Progress Notes (Signed)
Pt has already removed 2 of his IV's this shift per prior progress note by RN - Ultrasound of entire Lt Lower arm and nothing visualized to attempt - able to place a # 20 , 2.5" IV catheter in Lt upper arm. Pt fidgeting with everything during IV insertion, light, call button, Bed control, food tray, side rails and gown. RN made aware.

## 2022-08-19 NOTE — Progress Notes (Signed)
Patient ID: Samuel Blevins, male   DOB: 20-Jun-1965, 57 y.o.   MRN: [id number]  Odor of cigarette smoke in patient's room. Ashes on bedside table. Patient denies. Educated patient on no smoking/tobacco products policy. Educated that if we suspect again we will have to call security to search room. Charge nurse and MD notified. Door left open. Will continue to monitor.  Lidia Collum, RN

## 2022-08-19 NOTE — Progress Notes (Signed)
PROGRESS NOTE    Samuel Blevins  WUJ:[id number] DOB: 1965-03-24 DOA: 08/17/2022 PCP: [name], MD   Brief Narrative:  57 y.o. male with medical history significant of alcohol abuse, tobacco abuse, chronic pancreatitis, chronic pain, hx of seizure d/o, homeless.     Presents with upper abdominal pain for the past 3 days.  Last alcohol intake was this morning.  Today has emesis and abdominal distention  History of alcohol abuse and chronic pancreatitis.  Pt reported nausea and vomiting over the past 2 days.  He reports an episode of bloody bowel movement.  No prior melena no shortness of breath or chest pain    Recently hospitalized for acute pancreatitis. He continues to consume alcohol.   Still smoking and drinking about 1 fifth per day and homeless  He was admitted with acute alcohol induced pancreatitis.          Assessment & Plan:   Active Problems:   Chronic alcohol abuse   Chronic anemia   Chronic pain syndrome   Homeless   Tobacco abuse   Essential hypertension   Seizure (HCC)   Hypokalemia   Hypomagnesemia   Sepsis (HCC)   Pancreatitis   SBP (spontaneous bacterial peritonitis) (HCC)   Hyponatremia   Acute pancreatitis  Assessment and Plan:   Acute alcohol induced pancreatitis-improving - pt continues with heavy alcohol consumption despite recurrent bout of pancreatitis -Advance to clear liquid diet due to improvement in pain - aggressive IV fluid hydration  - IV pain and nausea management  - follow lipase until clinically improved - low threshold to transfer to higher level care if decompensates   Chronic alcohol abuser - high risk for acute alcohol withdrawal  - agree with CIWA protocol with supplemental vitamins added   Leukocytosis  - possibly reactive to acute pancreatitis  - there was some concern for SBP on admission - he is being treated for pancreatitis and remains on IV antibiotics - check CBC/diff in AM    Hypokalemia and  hypomagnesemia  - IV replacement ordered - follow   Hyponatremia  - multifactorial given acute dehydration, chronic alcoholism and liver disease  - hydrating with isotonic fluid - recheck labs in AM    Reactive thrombocytosis-improving - recheck CBC / diff in AM  - supportive management    Question of SBP per admission work up - IR was consulted on admission for paracentesis with fluid testing, 1.1 L of fluid removed 6/23 - empirically was started on broad spectrum antibiotic coverage  -Culture currently pending     History of alcohol withdrawal seizure - monitoring closely for acute alcohol withdrawal - maintain on CIWA protocol    Normocytic Anemia  - anticipating Hg will trend down with IV fluid hydration - Monitoring CBC    Homelessness - TOC consulted for assistance with resources    Tobacco User - nicotine patch ordered    DVT prophylaxis:SCDs Code Status: Full Family Communication: None at bedside Disposition Plan:  Status is: Inpatient Remains inpatient appropriate because: Need for IV fluid and medications.   Consultants:  IR  Procedures:  Paracentesis 6/23  Antimicrobials:  Anti-infectives (From admission, onward)    Start     Dose/Rate Route Frequency Ordered Stop   08/18/22 0600  metroNIDAZOLE (FLAGYL) IVPB 500 mg        500 mg 100 mL/hr over 60 Minutes Intravenous Every 12 hours 08/17/22 1946 08/25/22 0559   08/18/22 0400  ceFEPIme (MAXIPIME) 2 g in sodium chloride 0.9 %  100 mL IVPB        2 g 200 mL/hr over 30 Minutes Intravenous Every 8 hours 08/17/22 2008 08/25/22 0359   08/17/22 1845  ceFEPIme (MAXIPIME) 2 g in sodium chloride 0.9 % 100 mL IVPB  Status:  Discontinued        2 g 200 mL/hr over 30 Minutes Intravenous  Once 08/17/22 1834 08/17/22 1836   08/17/22 1845  metroNIDAZOLE (FLAGYL) IVPB 500 mg        500 mg 100 mL/hr over 60 Minutes Intravenous  Once 08/17/22 1834 08/17/22 2002   08/17/22 1845  ceFEPIme (MAXIPIME) 2 g in sodium  chloride 0.9 % 100 mL IVPB  Status:  Discontinued        2 g 200 mL/hr over 30 Minutes Intravenous Every 24 hours 08/17/22 1836 08/17/22 2008      Subjective: Patient seen and evaluated today with improvement in abdominal symptoms noted with less pain and no nausea or vomiting.  He is complaining of some joint aches and pains.  Objective: Vitals:   08/18/22 1736 08/18/22 1959 08/19/22 0208 08/19/22 0400  BP: 138/87 134/82 117/78 130/79  Pulse: (!) 124 (!) 122 (!) 118 (!) 119  Resp:  18 18 18   Temp:  99.5 F (37.5 C) 99.1 F (37.3 C) 98.9 F (37.2 C)  TempSrc:  Oral Oral Oral  SpO2:  96% 94% 95%  Weight:      Height:        Intake/Output Summary (Last 24 hours) at 08/19/2022 0639 Last data filed at 08/19/2022 0223 Gross per 24 hour  Intake 1165.28 ml  Output 750 ml  Net 415.28 ml   Filed Weights   08/17/22 1615  Weight: 74.8 kg    Examination:  General exam: Appears calm and comfortable  Respiratory system: Clear to auscultation. Respiratory effort normal. Cardiovascular system: S1 & S2 heard, RRR.  Gastrointestinal system: Abdomen is minimally distended.  Minimally tender to palpation. Central nervous system: Alert and awake Extremities: No edema Skin: No significant lesions noted Psychiatry: Flat affect.    Data Reviewed: I have personally reviewed following labs and imaging studies  CBC: Recent Labs  Lab 08/17/22 1623 08/17/22 1746 08/17/22 2040 08/18/22 0245 08/18/22 1824 08/19/22 0033  WBC 15.6*  --  14.2* 18.3*  --  17.6*  NEUTROABS 14.2*  --   --  15.9*  --  15.3*  HGB 13.1 13.9 11.3* 10.9* 10.1* 9.5*  HCT 37.9* 41.0 34.0* 31.3* 29.6* 28.0*  MCV 86.9  --  88.1 85.5  --  88.6  PLT 589*  --  469* 497*  --  433*   Basic Metabolic Panel: Recent Labs  Lab 08/17/22 1623 08/17/22 1746 08/17/22 2040 08/18/22 0245 08/19/22 0033  NA 125* 125* 126* 125* 126*  K 3.9 3.4* 3.0* 3.2* 3.2*  CL 80* 82* 84* 85* 88*  CO2 17*  --  21* 21* 22  GLUCOSE 98  100* 89 95 87  BUN 23* 30* 21* 23* 22*  CREATININE 1.17 1.20 1.00 1.24 0.97  CALCIUM 7.8*  --  7.3* 7.3* 7.2*  MG  --   --  1.2* 1.9 1.6*  PHOS  --   --  4.3 4.2  --    GFR: Estimated Creatinine Clearance: 87.8 mL/min (by C-G formula based on SCr of 0.97 mg/dL). Liver Function Tests: Recent Labs  Lab 08/17/22 1623 08/17/22 2040 08/18/22 0245 08/19/22 0033  AST 52* 32 30 27  ALT 20 15 18 14   ALKPHOS  110 93 85 75  BILITOT 2.6* 1.3* 2.1* 1.3*  PROT 7.4 6.6 6.5 5.7*  ALBUMIN 3.1* 2.6* 2.5* 2.0*   Recent Labs  Lab 08/17/22 1623 08/19/22 0033  LIPASE 767* 285*   Recent Labs  Lab 08/17/22 2040  AMMONIA 28   Coagulation Profile: Recent Labs  Lab 08/17/22 2040  INR 1.1   Cardiac Enzymes: Recent Labs  Lab 08/17/22 2040  CKTOTAL 68   BNP (last 3 results) No results for input(s): "PROBNP" in the last 8760 hours. HbA1C: No results for input(s): "HGBA1C" in the last 72 hours. CBG: Recent Labs  Lab 08/17/22 2150  GLUCAP 108*   Lipid Profile: No results for input(s): "CHOL", "HDL", "LDLCALC", "TRIG", "CHOLHDL", "LDLDIRECT" in the last 72 hours. Thyroid Function Tests: Recent Labs    08/17/22 2040  TSH 1.859   Anemia Panel: No results for input(s): "VITAMINB12", "FOLATE", "FERRITIN", "TIBC", "IRON", "RETICCTPCT" in the last 72 hours. Sepsis Labs: Recent Labs  Lab 08/17/22 1717 08/17/22 1835 08/17/22 2040 08/18/22 0043  PROCALCITON  --   --  0.22  --   LATICACIDVEN 3.0* 2.3* 2.6* 1.2    Recent Results (from the past 240 hour(s))  Culture, blood (routine x 2)     Status: None (Preliminary result)   Collection Time: 08/17/22  5:17 PM   Specimen: BLOOD RIGHT ARM  Result Value Ref Range Status   Specimen Description BLOOD RIGHT ARM  Final   Special Requests   Final    BOTTLES DRAWN AEROBIC AND ANAEROBIC Blood Culture adequate volume   Culture   Final    NO GROWTH < 24 HOURS Performed at [facility], [address] [place], [place] [address]     Report Status PENDING  Incomplete  Culture, blood (routine x 2)     Status: None (Preliminary result)   Collection Time: 08/17/22  5:56 PM   Specimen: BLOOD LEFT ARM  Result Value Ref Range Status   Specimen Description BLOOD LEFT ARM  Final   Special Requests   Final    BOTTLES DRAWN AEROBIC AND ANAEROBIC Blood Culture results may not be optimal due to an excessive volume of blood received in culture bottles   Culture   Final    NO GROWTH < 24 HOURS Performed at [facility], [address] [place], [place] [address]    Report Status PENDING  Incomplete  Urine Culture     Status: Abnormal (Preliminary result)   Collection Time: 08/17/22  6:45 PM   Specimen: Urine, Random  Result Value Ref Range Status   Specimen Description URINE, RANDOM  Final   Special Requests URINE, CLEAN CATCH  Final   Culture (A)  Final    90,000 COLONIES/mL CITROBACTER KOSERI SUSCEPTIBILITIES TO FOLLOW Performed at [facility], [address] [place], [place] [address]    Report Status PENDING  Incomplete  Body fluid culture w Gram Stain     Status: None (Preliminary result)   Collection Time: 08/18/22 12:00 PM   Specimen: PATH Cytology Peritoneal fluid  Result Value Ref Range Status   Specimen Description PERITONEAL  Final   Special Requests NONE  Final   Gram Stain   Final    RARE WBC PRESENT, PREDOMINANTLY PMN NO ORGANISMS SEEN Performed at [facility], [address] [place], [place] [address]    Culture PENDING  Incomplete   Report Status PENDING  Incomplete  Anaerobic culture w Gram Stain  Status: None (Preliminary result)   Collection Time: 08/18/22 12:00 PM   Specimen: Peritoneal Washings  Result Value Ref Range Status   Specimen Description PERITONEAL  Final   Special Requests NONE  Final   Gram Stain   Final    RARE WBC PRESENT, PREDOMINANTLY PMN NO ORGANISMS SEEN Performed at [facility], [address], [place], [place] [address]    Culture  PENDING  Incomplete   Report Status PENDING  Incomplete         Radiology Studies: CT ABDOMEN PELVIS W CONTRAST  Result Date: 08/18/2022 CLINICAL DATA:  Pancreatitis, acute, severe EXAM: CT ABDOMEN AND PELVIS WITH CONTRAST TECHNIQUE: Multidetector CT imaging of the abdomen and pelvis was performed using the standard protocol following bolus administration of intravenous contrast. RADIATION DOSE REDUCTION: This exam was performed according to the departmental dose-optimization program which includes automated exposure control, adjustment of the mA and/or kV according to patient size and/or use of iterative reconstruction technique. CONTRAST:  75mL OMNIPAQUE IOHEXOL 350 MG/ML SOLN COMPARISON:  Abdomen pelvis 08/17/2022 FINDINGS: Lower chest: Bilateral trace pleural effusions. Bilateral lower lobe atelectasis. Hepatobiliary: No focal liver abnormality. No gallstones, gallbladder wall thickening, or pericholecystic fluid. No biliary dilatation. Pancreas: Redemonstration of poorly defined pancreatic head contour along the second portion of the duodenum. Couple punctate calcific densities along the proximal pancreas likely related to the parenchyma with no definite intraductal calcification. No main pancreatic ductal dilatation. No pseudocyst formation. Spleen: Normal in size without focal abnormality. Adrenals/Urinary Tract: No adrenal nodule bilaterally. Bilateral kidneys enhance symmetrically. No hydronephrosis. No hydroureter. The urinary bladder is unremarkable. On delayed imaging, there is no urothelial wall thickening and there are no filling defects in the opacified portions of the bilateral collecting systems or ureters. Stomach/Bowel: Stomach is within normal limits. Slightly worsened mild thickening and haziness of the second portion of the duodenal wall. No discontinuity of the duodenal wall. No evidence of large bowel wall thickening or dilatation. Appendix appears normal. Vascular/Lymphatic: The  main portal, splenic, superior mesenteric veins are patent. No abdominal aorta or iliac aneurysm. Mild to moderate atherosclerotic plaque of the aorta and its branches. No abdominal, pelvic, or inguinal lymphadenopathy. Reproductive: Prostate is enlarged measuring up to 6 cm. Other: Grossly stable small to moderate volume simple free fluid ascites. No intraperitoneal free gas. No organized fluid collection. Musculoskeletal: No abdominal wall hernia or abnormality. No suspicious lytic or blastic osseous lesions. No acute displaced fracture. IMPRESSION: 1. Poorly defined pancreatic head contour along the second portion of the duodenum likely due to persistent acute pancreatitis. Couple punctate calcific densities along the proximal pancreas likely representing chronic pancreatitis. 2. Slightly worsened mild thickening and haziness of the second portion of the duodenal wall. Finding likely representing reactive changes with developing duodenitis not excluded. 3. Interval development of bilateral trace pleural effusions. 4. Grossly stable small to moderate volume simple free fluid ascites. 5. Prostatomegaly. Electronically Signed   By: Tish Frederickson M.D.   On: 08/18/2022 21:37   US Paracentesis  Result Date: 08/18/2022 INDICATION: Patient with history of ETOH abuse, chronic pancreatitic, seizures admitted with acute pancreatitis and found to have new onset ascites. Request for diagnostic and therapeutic paracentesis. EXAM: ULTRASOUND GUIDED DIAGNOSTIC AND THERAPEUTIC PARACENTESIS MEDICATIONS: 7 mL 1% lidocaine. COMPLICATIONS: None immediate. PROCEDURE: Informed written consent was obtained from the patient after a discussion of the risks, benefits and alternatives to treatment. A timeout was performed prior to the initiation of the procedure. Initial ultrasound scanning demonstrates a large amount  of ascites within the right upper abdominal quadrant. The right upper abdomen was prepped and draped in the usual  sterile fashion. 1% lidocaine was used for local anesthesia. Following this, a 19 gauge, 7-cm, Yueh catheter was introduced. An ultrasound image was saved for documentation purposes. The paracentesis was performed. The catheter was removed and a dressing was applied. The patient tolerated the procedure well without immediate post procedural complication. FINDINGS: A total of approximately 1.1 L of bloody fluid was removed. Samples were sent to the laboratory as requested by the clinical team. IMPRESSION: Successful ultrasound-guided paracentesis yielding 1.1 liters of peritoneal fluid. Performed by Lynnette Caffey, PA-C Electronically Signed   By: Malachy Moan M.D.   On: 08/18/2022 12:28   EEG adult  Result Date: 08/18/2022 Charlsie Quest, MD     08/18/2022  7:37 AM Patient Name: Jacky Dross MRN: [id number] Epilepsy Attending: Charlsie Quest Referring Physician/Provider: Therisa Doyne, MD Date: 08/18/2022 Duration: 27.58 mins Patient history: 57yo M with h/o seizures getting eeg to evaluate for seizure. Level of alertness: Awake AEDs during EEG study: None Technical aspects: This EEG study was done with scalp electrodes positioned according to the 10-20 International system of electrode placement. Electrical activity was reviewed with band pass filter of 1-70Hz , sensitivity of 7 uV/mm, display speed of 12mm/sec with a 60Hz  notched filter applied as appropriate. EEG data were recorded continuously and digitally stored.  Video monitoring was available and reviewed as appropriate. Description: No clear posterior dominant rhythm was seen. EEG showed continuous generalized 3 to 6 Hz theta-delta slowing. Photic driving was not seen during photic stimulation. Hyperventilation was not performed.   Of note, eeg was technically difficult due to significant myogenic artifact. ABNORMALITY - Continuous slow, generalized IMPRESSION: This technically difficult study is suggestive of moderate diffuse  encephalopathy, nonspecific etiology. No seizures or epileptiform discharges were seen throughout the recording. Please note lack of epileptiform discharges during interictal EEG does not exclude the diagnosis of epilepsy. Priyanka Annabelle Harman   CT HEAD WO CONTRAST ( )  Result Date: 08/17/2022 CLINICAL DATA:  Head trauma, coagulopathy EXAM: CT HEAD WITHOUT CONTRAST TECHNIQUE: Contiguous axial images were obtained from the base of the skull through the vertex without intravenous contrast. RADIATION DOSE REDUCTION: This exam was performed according to the departmental dose-optimization program which includes automated exposure control, adjustment of the mA and/or kV according to patient size and/or use of iterative reconstruction technique. COMPARISON:  06/06/2022 FINDINGS: Brain: There is no mass, hemorrhage or extra-axial collection. The size and configuration of the ventricles and extra-axial CSF spaces are normal. The brain parenchyma is normal, without acute or chronic infarction. Vascular: No abnormal hyperdensity of the major intracranial arteries or dural venous sinuses. No intracranial atherosclerosis. Skull: The visualized skull base, calvarium and extracranial soft tissues are normal. Sinuses/Orbits: No fluid levels or advanced mucosal thickening of the visualized paranasal sinuses. No mastoid or middle ear effusion. The orbits are normal. IMPRESSION: Normal head CT. Electronically Signed   By: Deatra Robinson M.D.   On: 08/17/2022 22:52   DG CHEST PORT 1 VIEW  Result Date: 08/17/2022 CLINICAL DATA:  Upper abdominal pain. EXAM: PORTABLE CHEST 1 VIEW COMPARISON:  Jul 11, 2022 FINDINGS: The heart size and mediastinal contours are within normal limits. Mild, stable atelectatic changes are seen within the bilateral lung bases. There is no evidence of focal consolidation, pleural effusion or pneumothorax. The visualized skeletal structures are unremarkable. IMPRESSION: Mild, stable bibasilar atelectasis.  Electronically Signed   By: Waylan Rocher  Houston M.D.   On: 08/17/2022 20:13   CT ABDOMEN PELVIS W CONTRAST  Result Date: 08/17/2022 CLINICAL DATA:  Three day history of upper abdominal pain associated with emesis and abdominal distention EXAM: CT ABDOMEN AND PELVIS WITH CONTRAST TECHNIQUE: Multidetector CT imaging of the abdomen and pelvis was performed using the standard protocol following bolus administration of intravenous contrast. RADIATION DOSE REDUCTION: This exam was performed according to the departmental dose-optimization program which includes automated exposure control, adjustment of the mA and/or kV according to patient size and/or use of iterative reconstruction technique. CONTRAST:  75mL OMNIPAQUE IOHEXOL 350 MG/ML SOLN COMPARISON:  CT abdomen and pelvis dated 07/11/2022 FINDINGS: Lower chest: No focal consolidation or pulmonary nodule in the lung bases. No pleural effusion or pneumothorax demonstrated. Partially imaged heart size is normal. Hepatobiliary: Diffuse parenchymal hypoattenuation can be seen with hepatic steatosis. No intra or extrahepatic biliary ductal dilation. Normal gallbladder. Pancreas: Decreased mild peripancreatic stranding surrounding the head and uncinate process. No focal lesions or main ductal dilation. Spleen: Normal in size without focal abnormality. Adrenals/Urinary Tract: No adrenal nodules. No suspicious renal mass, calculi or hydronephrosis. No focal bladder wall thickening. Stomach/Bowel: Normal appearance of the stomach. No evidence of bowel wall thickening, distention, or inflammatory changes. Normal appendix. Vascular/Lymphatic: Aortic atherosclerosis. No enlarged abdominal or pelvic lymph nodes. Reproductive: Enlargement of the prostate with median lobe hypertrophy. Other: New moderate volume ascites. No free air or fluid collection. Musculoskeletal: No acute or abnormal lytic or blastic osseous lesions. Postsurgical changes of the paraumbilical region.  Degenerative changes of the lumbar spine, most pronounced at L5-S1. IMPRESSION: 1. New moderate volume ascites. 2. Decreased mild peripancreatic stranding surrounding the head and uncinate process, which may represent improving pancreatitis. 3. Hepatic steatosis. 4. Enlargement of the prostate with median lobe hypertrophy. 5.  Aortic Atherosclerosis (ICD10-I70.0). Electronically Signed   By: Agustin Cree M.D.   On: 08/17/2022 18:55        Scheduled Meds:  carvedilol  3.125 mg Oral BID WC   folic acid  1 mg Oral Daily   multivitamin with minerals  1 tablet Oral Daily   nicotine  21 mg Transdermal Daily   pantoprazole (PROTONIX) IV  40 mg Intravenous QHS   thiamine  100 mg Oral Daily   Or   thiamine  100 mg Intravenous Daily   Continuous Infusions:  ceFEPime (MAXIPIME) IV 2 g (08/19/22 0433)   lactated ringers 150 mL/hr at 08/19/22 0432   magnesium sulfate bolus IVPB     metronidazole 500 mg (08/18/22 1743)   potassium chloride       LOS: 1 day    Time spent: 35 minutes    Aaralynn Shepheard Hoover Brunette, DO [facility]  If 7PM-7AM, please contact night-coverage [email or web address] 08/19/2022, 6:39 AM

## 2022-08-19 NOTE — TOC Initial Note (Signed)
Transition of Care [facility]) - Initial/Assessment Note    Patient Details  Name: Samuel Blevins MRN: [id number] Date of Birth: 05/06/1965  Transition of Care [facility]) CM/SW Contact:    Leander Rams, LCSW Phone Number: 08/19/2022, 10:33 AM  Clinical Narrative:                 CSW met with pt at bedside to discuss STR at SNF. Pt reports he's never gone but is agreeable to go when dc.   CSW will complete fl2 and fax out. TOC will continue to follow.   Expected Discharge Plan: Skilled Nursing Facility Barriers to Discharge: Continued Medical Work up   Patient Goals and CMS Choice            Expected Discharge Plan and Services       Living arrangements for the past 2 months:  (Pt reports he;s been styaing with his girlfiend son and sometimes gets a hotel)                                      Prior Living Arrangements/Services Living arrangements for the past 2 months:  (Pt reports he;s been styaing with his girlfiend son and sometimes gets a hotel) Lives with:: Self Patient language and need for interpreter reviewed:: Yes Do you feel safe going back to the place where you live?:  (Pt is homeless)          Current home services: DME Criminal Activity/Legal Involvement Pertinent to Current Situation/Hospitalization: No - Comment as needed  Activities of Daily Living      Permission Sought/Granted      Share Information with NAME: Sharolyn Douglas     Permission granted to share info w Relationship: [employer]     Emotional Assessment Appearance:: Developmentally appropriate Attitude/Demeanor/Rapport: Engaged Affect (typically observed): Accepting Orientation: : Oriented to Self, Oriented to Place, Oriented to  Time, Oriented to Situation Alcohol / Substance Use: Not Applicable Psych Involvement: No (comment)  Admission diagnosis:  Pancreatitis [K85.90] Other ascites [R18.8] SIRS (systemic inflammatory response syndrome) (HCC)  [R65.10] High anion gap metabolic acidosis [E87.29] Severe acute pancreatitis [K85.90] Acute pancreatitis [K85.90] Patient Active Problem List   Diagnosis Date Noted   Acute pancreatitis 08/18/2022   Pancreatitis 08/17/2022   SBP (spontaneous bacterial peritonitis) (HCC) 08/17/2022   Hyponatremia 08/17/2022   Urinary tract infection 07/17/2022   Abnormal finding on GI tract imaging 07/13/2022   Abdominal pain, epigastric 07/11/2022   Sepsis (HCC) 06/10/2022   Acute kidney injury (nontraumatic) (HCC) 06/10/2022   Fall 04/16/2022   Alcohol withdrawal with inpatient treatment with perceptual disturbance (HCC) 04/13/2022   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   Hypokalemia    Hypomagnesemia    Portal vein thrombosis 08/03/2021   Alcoholic pancreatitis 08/03/2021   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   Anxiety and depression 07/04/2021   Homeless 07/04/2021   Osteoarthritis 07/04/2021   Thrombocytopenia (HCC) 05/09/2021   Pancreatic insufficiency 05/08/2021   Chronic alcohol abuse 05/07/2021   Alcoholic steatohepatitis 12/07/2020   Iron deficiency anemia due to chronic blood loss 12/07/2020   Seizure (HCC) 12/04/2020   Alcohol withdrawal (HCC) 09/16/2020   Chronic pain syndrome 12/28/2019   Pancreatic pseudocyst 10/28/2019   Gastritis and gastroduodenitis    AVM (arteriovenous malformation) of small bowel, acquired    Transaminitis    Alcohol dependence syndrome (HCC) 06/21/2019   Chronic anemia 11/23/2015  Tobacco abuse 03/17/2014   Essential hypertension 03/17/2014   PCP:  [name], MD Pharmacy:   [facility] [address], [place] - [address] [place] [address] Phone: [phone] Fax: [phone]  [place] [facility] [address] [place] [address] Phone: [phone] Fax: [phone]  [facility] [address] [place] [address] Phone: [phone] Fax: [phone]  [facility] [address] [place] [address] Phone: [phone] Fax: [phone]     Social Determinants of Health (SDOH) Social History: SDOH Screenings   Food Insecurity: Food Insecurity Present (07/11/2022)  Housing: Medium Risk (07/11/2022)  Transportation Needs: No Transportation Needs (07/11/2022)  Utilities: Patient Declined (07/11/2022)  Depression (PHQ2-9): High Risk (02/14/2022)  Tobacco Use: High Risk (08/17/2022)   SDOH Interventions:     Readmission Risk Interventions     No data to display         Oletta Lamas, MSW, LCSWA, LCASA Transitions of Care  Clinical Social Worker I

## 2022-08-19 NOTE — Progress Notes (Signed)
Patient ID: Samuel Blevins, male   DOB: 09-Aug-1965, 57 y.o.   MRN: [id number]  Patient has removed both IVs this shift. Education provided. Attempted to replace. IV consult placed.  Lidia Collum, RN

## 2022-08-19 NOTE — [place] FL2 (Signed)
[place] MEDICAID FL2 LEVEL OF CARE FORM     IDENTIFICATION  Patient Name: Samuel Blevins Birthdate: 1965-12-22 Sex: male Admission Date (Current Location): 08/17/2022  [facility] and [place] Number:  Producer, television/film/video and Address:  [facility], [address] [place], [place] [address]      Provider Number: [id number]  Attending Physician Name and Address:  Erick Blinks, DO  Relative Name and Phone Number:       Current Level of Care: Hospital Recommended Level of Care: Skilled Nursing Facility Prior Approval Number:    Date Approved/Denied:   PASRR Number: [id number]  Discharge Plan: SNF    Current Diagnoses: Patient Active Problem List   Diagnosis Date Noted   Acute pancreatitis 08/18/2022   Pancreatitis 08/17/2022   SBP (spontaneous bacterial peritonitis) (HCC) 08/17/2022   Hyponatremia 08/17/2022   Urinary tract infection 07/17/2022   Abnormal finding on GI tract imaging 07/13/2022   Abdominal pain, epigastric 07/11/2022   Sepsis (HCC) 06/10/2022   Acute kidney injury (nontraumatic) (HCC) 06/10/2022   Fall 04/16/2022   Alcohol withdrawal with inpatient treatment with perceptual disturbance (HCC) 04/13/2022   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   Hypokalemia    Hypomagnesemia    Portal vein thrombosis 08/03/2021   Alcoholic pancreatitis 08/03/2021   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   Anxiety and depression 07/04/2021   Homeless 07/04/2021   Osteoarthritis 07/04/2021   Thrombocytopenia (HCC) 05/09/2021   Pancreatic insufficiency 05/08/2021   Chronic alcohol abuse 05/07/2021   Alcoholic steatohepatitis 12/07/2020   Iron deficiency anemia due to chronic blood loss 12/07/2020   Seizure (HCC) 12/04/2020   Alcohol withdrawal (HCC) 09/16/2020   Chronic pain syndrome 12/28/2019   Pancreatic pseudocyst 10/28/2019   Gastritis and gastroduodenitis    AVM (arteriovenous malformation) of  small bowel, acquired    Transaminitis    Alcohol dependence syndrome (HCC) 06/21/2019   Chronic anemia 11/23/2015   Tobacco abuse 03/17/2014   Essential hypertension 03/17/2014    Orientation RESPIRATION BLADDER Height & Weight     Self, Time, Situation, Place  Normal Incontinent, External catheter Weight: 165 lb (74.8 kg) Height:  6\' 1"  (185.4 cm)  BEHAVIORAL SYMPTOMS/MOOD NEUROLOGICAL BOWEL NUTRITION STATUS      Continent Diet (See dc summary)  AMBULATORY STATUS COMMUNICATION OF NEEDS Skin   Limited Assist Verbally Normal                       Personal Care Assistance Level of Assistance  Bathing, Feeding, Dressing Bathing Assistance: Limited assistance Feeding assistance: Independent Dressing Assistance: Limited assistance     Functional Limitations Info  Sight, Hearing, Speech Sight Info: Adequate Hearing Info: Adequate Speech Info: Adequate    SPECIAL CARE FACTORS FREQUENCY  PT (By licensed PT), OT (By licensed OT)     PT Frequency: 5xweek OT Frequency: 5xweek            Contractures Contractures Info: Not present    Additional Factors Info  Code Status, Allergies Code Status Info: Full Allergies Info: NKA           Current Medications (08/19/2022):  This is the current hospital active medication list Current Facility-Administered Medications  Medication Dose Route Frequency Provider Last Rate Last Admin   0.9 %  sodium chloride infusion   Intravenous Continuous Sherryll Burger, Pratik D, DO 150 mL/hr at 08/19/22 0900 New Bag at 08/19/22 0900   acetaminophen (TYLENOL) tablet 650 mg  650 mg  Oral Q6H PRN Therisa Doyne, MD       Or   acetaminophen (TYLENOL) suppository 650 mg  650 mg Rectal Q6H PRN Doutova, Anastassia, MD       albuterol (PROVENTIL) (2.5 MG/3ML) 0.083% nebulizer solution 2.5 mg  2.5 mg Nebulization Q2H PRN Doutova, Anastassia, MD       carvedilol (COREG) tablet 3.125 mg  3.125 mg Oral BID WC Johnson, Clanford L, MD   3.125 mg at  08/18/22 1736   ceFEPIme (MAXIPIME) 2 g in sodium chloride 0.9 % 100 mL IVPB  2 g Intravenous Q8H Doutova, Anastassia, MD 200 mL/hr at 08/19/22 0433 2 g at 08/19/22 0433   folic acid (FOLVITE) tablet 1 mg  1 mg Oral Daily Doutova, Anastassia, MD   1 mg at 08/19/22 0905   HYDROmorphone (DILAUDID) injection 1 mg  1 mg Intravenous Q4H PRN Johnson, Clanford L, MD   1 mg at 08/19/22 1107   labetalol (NORMODYNE) injection 5 mg  5 mg Intravenous Q2H PRN Johnson, Clanford L, MD       LORazepam (ATIVAN) tablet 1-4 mg  1-4 mg Oral Q1H PRN Therisa Doyne, MD   1 mg at 08/18/22 8657   Or   LORazepam (ATIVAN) injection 1-4 mg  1-4 mg Intravenous Q1H PRN Therisa Doyne, MD   2 mg at 08/17/22 2042   metroNIDAZOLE (FLAGYL) IVPB 500 mg  500 mg Intravenous Q12H Doutova, Anastassia, MD 100 mL/hr at 08/19/22 0634 500 mg at 08/19/22 8469   multivitamin with minerals tablet 1 tablet  1 tablet Oral Daily Therisa Doyne, MD   1 tablet at 08/19/22 0905   nicotine (NICODERM CQ - dosed in mg/24 hours) patch 21 mg  21 mg Transdermal Daily Doutova, Anastassia, MD   21 mg at 08/19/22 0905   ondansetron (ZOFRAN) tablet 4 mg  4 mg Oral Q6H PRN Therisa Doyne, MD       Or   ondansetron (ZOFRAN) injection 4 mg  4 mg Intravenous Q6H PRN Doutova, Anastassia, MD       pantoprazole (PROTONIX) injection 40 mg  40 mg Intravenous QHS Doutova, Anastassia, MD   40 mg at 08/18/22 2250   potassium chloride 10 mEq in 100 mL IVPB  10 mEq Intravenous Q1 Hr x 4 Shah, Pratik D, DO 100 mL/hr at 08/19/22 1355 10 mEq at 08/19/22 1355   thiamine (VITAMIN B1) tablet 100 mg  100 mg Oral Daily Therisa Doyne, MD   100 mg at 08/19/22 6295     Discharge Medications: Please see discharge summary for a list of discharge medications.  Relevant Imaging Results:  Relevant Lab Results:   Additional Information SSN: [id number]  Oletta Lamas, MSW, [facility] Transitions of Care  Clinical Social Worker I

## 2022-08-19 NOTE — Progress Notes (Signed)
I, charge nurse, was notified that patient was smoking in room. Patient stated to me that he did not have anything. RN states she saw ashes on table. NT stated to me that he has cigarettes in his rollator seat. Cigarettes were then found and placed in patient belongings section at nurses' station.

## 2022-08-20 DIAGNOSIS — R7989 Other specified abnormal findings of blood chemistry: Secondary | ICD-10-CM

## 2022-08-20 DIAGNOSIS — D649 Anemia, unspecified: Secondary | ICD-10-CM | POA: Diagnosis not present

## 2022-08-20 DIAGNOSIS — D696 Thrombocytopenia, unspecified: Secondary | ICD-10-CM

## 2022-08-20 DIAGNOSIS — F1010 Alcohol abuse, uncomplicated: Secondary | ICD-10-CM | POA: Diagnosis not present

## 2022-08-20 DIAGNOSIS — K852 Alcohol induced acute pancreatitis without necrosis or infection: Secondary | ICD-10-CM | POA: Diagnosis not present

## 2022-08-20 DIAGNOSIS — K652 Spontaneous bacterial peritonitis: Secondary | ICD-10-CM | POA: Diagnosis not present

## 2022-08-20 DIAGNOSIS — E46 Unspecified protein-calorie malnutrition: Secondary | ICD-10-CM

## 2022-08-20 DIAGNOSIS — K861 Other chronic pancreatitis: Secondary | ICD-10-CM

## 2022-08-20 LAB — CBC
HCT: 26.5 % — ABNORMAL LOW (ref 39.0–52.0)
Hemoglobin: 9 g/dL — ABNORMAL LOW (ref 13.0–17.0)
MCH: 29.5 pg (ref 26.0–34.0)
MCHC: 34 g/dL (ref 30.0–36.0)
MCV: 86.9 fL (ref 80.0–100.0)
Platelets: 424 10*3/uL — ABNORMAL HIGH (ref 150–400)
RBC: 3.05 MIL/uL — ABNORMAL LOW (ref 4.22–5.81)
RDW: 21.3 % — ABNORMAL HIGH (ref 11.5–15.5)
WBC: 13.1 10*3/uL — ABNORMAL HIGH (ref 4.0–10.5)
nRBC: 0 % (ref 0.0–0.2)

## 2022-08-20 LAB — VITAMIN B12: Vitamin B-12: 425 pg/mL (ref 180–914)

## 2022-08-20 LAB — BODY FLUID CULTURE W GRAM STAIN: Culture: NO GROWTH

## 2022-08-20 LAB — PROTIME-INR
INR: 1.1 (ref 0.8–1.2)
Prothrombin Time: 14.4 seconds (ref 11.4–15.2)

## 2022-08-20 LAB — COMPREHENSIVE METABOLIC PANEL
ALT: 17 U/L (ref 0–44)
AST: 51 U/L — ABNORMAL HIGH (ref 15–41)
Albumin: 2.1 g/dL — ABNORMAL LOW (ref 3.5–5.0)
Alkaline Phosphatase: 79 U/L (ref 38–126)
Anion gap: 14 (ref 5–15)
BUN: 18 mg/dL (ref 6–20)
CO2: 20 mmol/L — ABNORMAL LOW (ref 22–32)
Calcium: 7.3 mg/dL — ABNORMAL LOW (ref 8.9–10.3)
Chloride: 91 mmol/L — ABNORMAL LOW (ref 98–111)
Creatinine, Ser: 1.04 mg/dL (ref 0.61–1.24)
GFR, Estimated: >60 mL/min (ref >60)
Glucose, Bld: 84 mg/dL (ref 70–99)
Potassium: 3.5 mmol/L (ref 3.5–5.1)
Sodium: 125 mmol/L — ABNORMAL LOW (ref 135–145)
Total Bilirubin: 1.4 mg/dL — ABNORMAL HIGH (ref 0.3–1.2)
Total Protein: 5.8 g/dL — ABNORMAL LOW (ref 6.5–8.1)

## 2022-08-20 LAB — IRON AND TIBC
Iron: 7 ug/dL — ABNORMAL LOW (ref 45–182)
Saturation Ratios: 3 % — ABNORMAL LOW (ref 17.9–39.5)
TIBC: 210 ug/dL — ABNORMAL LOW (ref 250–450)
UIBC: 203 ug/dL

## 2022-08-20 LAB — LIPASE, BLOOD: Lipase: 138 U/L — ABNORMAL HIGH (ref 11–51)

## 2022-08-20 LAB — CYTOLOGY - NON PAP

## 2022-08-20 LAB — CULTURE, BLOOD (ROUTINE X 2)

## 2022-08-20 LAB — FERRITIN: Ferritin: 335 ng/mL (ref 24–336)

## 2022-08-20 LAB — MAGNESIUM: Magnesium: 2 mg/dL (ref 1.7–2.4)

## 2022-08-20 MED ORDER — PANTOPRAZOLE SODIUM 40 MG IV SOLR
40.0000 mg | Freq: Two times a day (BID) | INTRAVENOUS | Status: DC
  Administered 2022-08-20 – 2022-08-21 (×4): 40 mg via INTRAVENOUS
  Filled 2022-08-20 (×4): qty 10

## 2022-08-20 MED ORDER — ENSURE ENLIVE PO LIQD
237.0000 mL | Freq: Two times a day (BID) | ORAL | Status: DC
  Administered 2022-08-20: 237 mL via ORAL

## 2022-08-20 MED ORDER — LOPERAMIDE HCL 2 MG PO CAPS
2.0000 mg | ORAL_CAPSULE | ORAL | Status: DC | PRN
  Administered 2022-08-20 – 2022-08-21 (×3): 2 mg via ORAL
  Filled 2022-08-20 (×3): qty 1

## 2022-08-20 NOTE — Progress Notes (Signed)
Security notified of confiscated items; Geologist, engineering a small bag of what appeared to be marijuana and empty bottle with a 99 bananas label, though told this RN to retain the cigarettes and lighter to be returned on discharge. Cigarettes and lighter returned to patient belongings section at nurses' station.

## 2022-08-20 NOTE — Progress Notes (Signed)
Pharmacy Antibiotic Note  Samuel Blevins is a 57 y.o. male who continues on cefepime for  IAI / pancreatitis.  Day # 3/7.  Also noted to have pan sensitive citrobacter in UCx.  Renal fxn remains stable.  WBC improving.  Slowly advancing diet.  Estimated Creatinine Clearance: 81.9 mL/min (by C-G formula based on SCr of 1.04 mg/dL).  Plan: Continue Cefepime 2g q8hr  Metronidazole 500mg  IV q12h Follow-up toleration of PO to change meds to PO route Monitor WBC, fever, renal function, cultures De-escalate when able   Height: 6\' 1"  (185.4 cm) Weight: 74.8 kg (165 lb) IBW/kg (Calculated) : 79.9  Temp (24hrs), Avg:99.1 F (37.3 C), Min:98.2 F (36.8 C), Max:99.8 F (37.7 C)  Recent Labs  Lab 08/17/22 1623 08/17/22 1717 08/17/22 1746 08/17/22 1835 08/17/22 2040 08/18/22 0043 08/18/22 0245 08/19/22 0033 08/20/22 0026  WBC 15.6*  --   --   --  14.2*  --  18.3* 17.6* 13.1*  CREATININE 1.17  --  1.20  --  1.00  --  1.24 0.97 1.04  LATICACIDVEN  --  3.0*  --  2.3* 2.6* 1.2  --   --   --      Estimated Creatinine Clearance: 81.9 mL/min (by C-G formula based on SCr of 1.04 mg/dL).    No Known Allergies  Abx doses and Microbiology results: Cefepime 6/22> Metronidazole 6/22>   6/22 Ucx: citrobacter koseri (pan-S) 6/22 Bcx: ngtd 6/23 peritoneal washing: no orgs    Thank you for allowing pharmacy to be a part of this patient's care.  [employer], Pharm.D., BCPS Clinical Pharmacist Clinical phone for 08/20/2022 from 7:30-3:00 is [phone].  **Pharmacist phone directory can be found on [email or web address] listed under [facility].  08/20/2022 10:41 AM

## 2022-08-20 NOTE — Consult Note (Addendum)
Consultation  Referring Provider:   Dr. Sherryll Burger [facility] Primary Care Physician:  [name], MD Primary Gastroenterologist:  Dr. Tomasa Rand       Reason for Consultation:  Acute on chronic pancreatitis due to ETOH with ascites and SBP         HPI:   Samuel Blevins is a 57 y.o. male with past medical history significant for IDA, intestinal AVMs, H.pylori gastritis, hepatic steatosis with history of alcohol abuse and alcohol withdrawal, HTN, tobacco abuse, history of recurrent pancreatitis with pancreatic insufficiency on Creon, thrombocytopenia,    07/11/2022 recent admission to the hospital  for abdominal pain, nausea and vomiting.  At that time patient had hypokalemia, mild AST elevation, lipase only 116, hemoglobin 11.2, thrombocytopenia 117.  CT showed inflammatory changes around had not insonate process pancreas inflammatory changes around the descending duodenum with increased severe duodenal thickening.  Hepatic steatosis diverticulosis  Patient admitted 6/22 with 3 days of upper abdominal pain, nausea vomiting.   On admission to the ER patient was found to have hyponatremia, elevated lipase, leukocytosis 15.6.  Urine drug screen positive for opiates and benzos.  Lactic acid 3.0, started on metronidazole, cefepime and vancomycin.   CT scan Decreased mild peripancreatic stranding surrounding the head and uncinate process, which may represent improving pancreatitis.Hepatic steatosis and moderate ascites. 08/18/2022 paracentesis with 1.1 L-total protein 4.2,  cell count which 4500 with neutrophil count of 95, red-colored fluid turbid , cytology pending Albumin 2.1, his albumin is 2.5-with gradient 0.4, ascites not  portal hypertension-likely secondary to paracentesis.  Patient was lying in bed when I entered the room sleeping, patient had just received pain medications was very lethargic.  He also became very irritated when I kept asking questions and states he will leave if you  believe alone. Patient not very cooperative. Patient states he started having abdominal pain with nausea and vomiting Tuesday, also noted AB swelling Tuesday with leg swelling.  Has been having loose stools, denies melena or hematochezia.. Started having fevers and chills Tuesday. Patient states he is feeling better overall but wants to be left alone. Last alcohol intake was Tuesday the day of admission, patient drinks 1 pint alcohol daily for the last 30 years.  Continues to smoke.   Denies NSAID or drug use. No family in room at present  Previous Endoscopic Evaluations / Pertinent Studies:  EGD 08/04/2021 with Dr. Tomasa Rand for hematemesis: Normal esophagus, 3 cm hiatal hernia, normal stomach, single duodenal polyp (biopsied as reactive duodenal mucosa with extensive gastric metaplasia compatible with peptic duodenitis).  Stomach biopsy negative for dysplasia/H. pylori.  Showed mild chronic gastritis and focal intestinal metaplasia   EGD 12/12/2020 with Dr. Chales Abrahams for suspected UGIB: Mild gastritis, single duodenal erosion without bleeding. Negative h pylori   EGD 06/23/2019 with Dr. Barron Alvine for IDA, hematemesis, melena: Normal esophagus, gastritis, 6 angioectasias in duodenum treated with APC, mucosal changes in duodenum.  Biopsies showed mildly active H. pylori gastritis. Treated with quadruple therapy.   Past Medical History:  Diagnosis Date   Alcohol withdrawal syndrome with complication (HCC)    Alcoholism (HCC)    Elevated AST (SGOT)    Gastrointestinal hemorrhage    Homeless    Hypertension    Pancreatic insufficiency    takes Creon   Symptomatic anemia 11/23/2015   Thrombocytopenia (HCC) 05/09/2021    Surgical History:  He  has a past surgical history that includes Esophagogastroduodenoscopy (egd) with propofol (N/A, 06/23/2019); Hot hemostasis (N/A, 06/23/2019);  biopsy (06/23/2019); Esophagogastroduodenoscopy (egd) with propofol (N/A, 12/12/2020); biopsy (12/12/2020);  Esophagogastroduodenoscopy (N/A, 08/04/2021); and biopsy (08/04/2021). Family History:  His family history is not on file. Social History:   reports that he has been smoking cigarettes. He has a 15.00 pack-year smoking history. He has never used smokeless tobacco. He reports current alcohol use of about 28.0 standard drinks of alcohol per week. He reports that he does not use drugs.  Prior to Admission medications   Medication Sig Start Date End Date Taking? Authorizing Provider  acetaminophen (TYLENOL) 325 MG tablet Take 2 tablets (650 mg total) by mouth every 6 (six) hours as needed for fever, moderate pain, mild pain or headache. Patient not taking: Reported on 07/11/2022 12/12/20   Belva Agee, MD  carvedilol (COREG) 3.125 MG tablet Take 1 tablet (3.125 mg total) by mouth 2 (two) times daily with a meal. Patient not taking: Reported on 07/11/2022 07/10/22   [name], MD  folic acid (FOLVITE) 1 MG tablet Take 1 tablet (1 mg total) by mouth daily. 07/14/22   Rolly Salter, MD  Iron, Ferrous Sulfate, 325 (65 Fe) MG TABS Take 1 tablet ( 325 mg) by mouth daily. Patient not taking: Reported on 07/11/2022 05/24/22   [name], MD  lipase/protease/amylase (CREON) 12000-38000 units CPEP capsule Take 1 capsule (12,000 Units total) by mouth 3 (three) times daily before meals. 07/14/22   Rolly Salter, MD  magnesium oxide (MAG-OX) 400 MG tablet Take 1 tablet (400 mg total) by mouth in the morning and at bedtime. 07/14/22   Rolly Salter, MD  Multiple Vitamin (MULTIVITAMIN WITH MINERALS) TABS tablet Take 1 tablet by mouth daily. 07/14/22   Rolly Salter, MD  nicotine (NICODERM CQ - DOSED IN MG/24 HOURS) 14 mg/24hr patch Place 1 patch (14 mg total) onto the skin daily. 07/14/22   Rolly Salter, MD  pantoprazole (PROTONIX) 40 MG tablet Take 1 tablet (40 mg total) by mouth 2 (two) times daily before a meal. 07/14/22   Rolly Salter, MD  polyethylene glycol (MIRALAX / GLYCOLAX) 17 g  packet Take 17 g by mouth daily as needed for mild constipation or moderate constipation. 07/14/22   Rolly Salter, MD  thiamine (VITAMIN B-1) 100 MG tablet Take 1 tablet (100 mg total) by mouth daily. 07/14/22   Rolly Salter, MD  traMADol (ULTRAM) 50 MG tablet Take 1 tablet (50 mg total) by mouth every 8 (eight) hours as needed for severe pain. 07/17/22   [name], MD    Current Facility-Administered Medications  Medication Dose Route Frequency Provider Last Rate Last Admin   0.9 %  sodium chloride infusion   Intravenous Continuous Maurilio Lovely D, DO 150 mL/hr at 08/19/22 2303 New Bag at 08/19/22 2303   acetaminophen (TYLENOL) tablet 650 mg  650 mg Oral Q6H PRN Therisa Doyne, MD       Or   acetaminophen (TYLENOL) suppository 650 mg  650 mg Rectal Q6H PRN Doutova, Anastassia, MD       albuterol (PROVENTIL) (2.5 MG/3ML) 0.083% nebulizer solution 2.5 mg  2.5 mg Nebulization Q2H PRN Doutova, Anastassia, MD       carvedilol (COREG) tablet 3.125 mg  3.125 mg Oral BID WC Johnson, Clanford L, MD   3.125 mg at 08/19/22 1804   ceFEPIme (MAXIPIME) 2 g in sodium chloride 0.9 % 100 mL IVPB  2 g Intravenous Q8H Doutova, Anastassia, MD 200 mL/hr at 08/20/22 0416 2 g at 08/20/22 0416  folic acid (FOLVITE) tablet 1 mg  1 mg Oral Daily Doutova, Anastassia, MD   1 mg at 08/19/22 0905   HYDROmorphone (DILAUDID) injection 1 mg  1 mg Intravenous Q4H PRN Laural Benes, Clanford L, MD   1 mg at 08/20/22 4098   labetalol (NORMODYNE) injection 5 mg  5 mg Intravenous Q2H PRN Johnson, Clanford L, MD       LORazepam (ATIVAN) tablet 1-4 mg  1-4 mg Oral Q1H PRN Therisa Doyne, MD   1 mg at 08/19/22 1521   Or   LORazepam (ATIVAN) injection 1-4 mg  1-4 mg Intravenous Q1H PRN Therisa Doyne, MD   2 mg at 08/19/22 1755   metroNIDAZOLE (FLAGYL) IVPB 500 mg  500 mg Intravenous Q12H Therisa Doyne, MD 100 mL/hr at 08/20/22 0611 500 mg at 08/20/22 1191   multivitamin with minerals tablet 1 tablet  1 tablet  Oral Daily Therisa Doyne, MD   1 tablet at 08/19/22 4782   nicotine (NICODERM CQ - dosed in mg/24 hours) patch 21 mg  21 mg Transdermal Daily Doutova, Jonny Ruiz, MD   21 mg at 08/19/22 0905   ondansetron (ZOFRAN) tablet 4 mg  4 mg Oral Q6H PRN Therisa Doyne, MD       Or   ondansetron (ZOFRAN) injection 4 mg  4 mg Intravenous Q6H PRN Doutova, Anastassia, MD       pantoprazole (PROTONIX) injection 40 mg  40 mg Intravenous QHS Therisa Doyne, MD   40 mg at 08/19/22 2136   thiamine (VITAMIN B1) tablet 100 mg  100 mg Oral Daily Therisa Doyne, MD   100 mg at 08/19/22 9562    Allergies as of 08/17/2022   (No Known Allergies)    Review of Systems:    Constitutional: No weight loss, fever, chills, weakness or fatigue HEENT: Eyes: No change in vision               Ears, Nose, Throat:  No change in hearing or congestion Skin: No rash or itching Cardiovascular: No chest pain, chest pressure or palpitations   Respiratory: No SOB or cough Gastrointestinal: See HPI and otherwise negative Genitourinary: No dysuria or change in urinary frequency Neurological: No headache, dizziness or syncope Musculoskeletal: No new muscle or joint pain Hematologic: No bleeding or bruising Psychiatric: No history of depression or anxiety     Physical Exam:  Vital signs in last 24 hours: Temp:  [98.2 F (36.8 C)-99.8 F (37.7 C)] 98.2 F (36.8 C) (06/25 0808) Pulse Rate:  [110-117] 115 (06/25 0808) Resp:  [16-20] 18 (06/25 0808) BP: (121-152)/(81-91) 152/84 (06/25 0808) SpO2:  [90 %-96 %] 96 % (06/25 0808) Last BM Date : 08/18/22 Last BM recorded by nurses in past 5 days Stool Type: Type 7 (Liquid consistency with no solid pieces) (08/19/2022 10:31 AM)  General:   Chronically ill-appearing young African-American male, lethargic but no distress. Head:  Normocephalic and atraumatic. Eyes: scleral icterus,conjunctive pale  Heart:  regular rate and rhythm Pulm: Clear anteriorly; no  wheezing Abdomen:  Distended, Obese AB, Active bowel sounds. No tenderness . Without guarding and Without rebound, No organomegaly appreciated. Extremities:  Without edema. Msk:  Symmetrical without gross deformities. Peripheral pulses intact.  Neurologic:  Alert and  oriented x4;  No focal deficits.  Skin:   Dry and intact without significant lesions or rashes. Psychiatric:  Cooperative. Normal mood and affect.  LAB RESULTS: Recent Labs    08/18/22 0245 08/18/22 1824 08/19/22 0033 08/20/22 0026  WBC 18.3*  --  17.6* 13.1*  HGB 10.9* 10.1* 9.5* 9.0*  HCT 31.3* 29.6* 28.0* 26.5*  PLT 497*  --  433* 424*   BMET Recent Labs    08/18/22 0245 08/19/22 0033 08/20/22 0026  NA 125* 126* 125*  K 3.2* 3.2* 3.5  CL 85* 88* 91*  CO2 21* 22 20*  GLUCOSE 95 87 84  BUN 23* 22* 18  CREATININE 1.24 0.97 1.04  CALCIUM 7.3* 7.2* 7.3*   LFT Recent Labs    08/17/22 2040 08/18/22 0245 08/20/22 0026  PROT 6.6   < > 5.8*  ALBUMIN 2.6*   < > 2.1*  AST 32   < > 51*  ALT 15   < > 17  ALKPHOS 93   < > 79  BILITOT 1.3*   < > 1.4*  BILIDIR 0.5*  --   --   IBILI 0.8  --   --    < > = values in this interval not displayed.   PT/INR Recent Labs    08/17/22 2040  LABPROT 14.3  INR 1.1    STUDIES: CT ABDOMEN PELVIS W CONTRAST  Result Date: 08/18/2022 CLINICAL DATA:  Pancreatitis, acute, severe EXAM: CT ABDOMEN AND PELVIS WITH CONTRAST TECHNIQUE: Multidetector CT imaging of the abdomen and pelvis was performed using the standard protocol following bolus administration of intravenous contrast. RADIATION DOSE REDUCTION: This exam was performed according to the departmental dose-optimization program which includes automated exposure control, adjustment of the mA and/or kV according to patient size and/or use of iterative reconstruction technique. CONTRAST:  75mL OMNIPAQUE IOHEXOL 350 MG/ML SOLN COMPARISON:  Abdomen pelvis 08/17/2022 FINDINGS: Lower chest: Bilateral trace pleural effusions.  Bilateral lower lobe atelectasis. Hepatobiliary: No focal liver abnormality. No gallstones, gallbladder wall thickening, or pericholecystic fluid. No biliary dilatation. Pancreas: Redemonstration of poorly defined pancreatic head contour along the second portion of the duodenum. Couple punctate calcific densities along the proximal pancreas likely related to the parenchyma with no definite intraductal calcification. No main pancreatic ductal dilatation. No pseudocyst formation. Spleen: Normal in size without focal abnormality. Adrenals/Urinary Tract: No adrenal nodule bilaterally. Bilateral kidneys enhance symmetrically. No hydronephrosis. No hydroureter. The urinary bladder is unremarkable. On delayed imaging, there is no urothelial wall thickening and there are no filling defects in the opacified portions of the bilateral collecting systems or ureters. Stomach/Bowel: Stomach is within normal limits. Slightly worsened mild thickening and haziness of the second portion of the duodenal wall. No discontinuity of the duodenal wall. No evidence of large bowel wall thickening or dilatation. Appendix appears normal. Vascular/Lymphatic: The main portal, splenic, superior mesenteric veins are patent. No abdominal aorta or iliac aneurysm. Mild to moderate atherosclerotic plaque of the aorta and its branches. No abdominal, pelvic, or inguinal lymphadenopathy. Reproductive: Prostate is enlarged measuring up to 6 cm. Other: Grossly stable small to moderate volume simple free fluid ascites. No intraperitoneal free gas. No organized fluid collection. Musculoskeletal: No abdominal wall hernia or abnormality. No suspicious lytic or blastic osseous lesions. No acute displaced fracture. IMPRESSION: 1. Poorly defined pancreatic head contour along the second portion of the duodenum likely due to persistent acute pancreatitis. Couple punctate calcific densities along the proximal pancreas likely representing chronic pancreatitis. 2.  Slightly worsened mild thickening and haziness of the second portion of the duodenal wall. Finding likely representing reactive changes with developing duodenitis not excluded. 3. Interval development of bilateral trace pleural effusions. 4. Grossly stable small to moderate volume simple free fluid ascites. 5. Prostatomegaly. Electronically Signed   By: Blanchie Serve  Tessie Fass M.D.   On: 08/18/2022 21:37   US Paracentesis  Result Date: 08/18/2022 INDICATION: Patient with history of ETOH abuse, chronic pancreatitic, seizures admitted with acute pancreatitis and found to have new onset ascites. Request for diagnostic and therapeutic paracentesis. EXAM: ULTRASOUND GUIDED DIAGNOSTIC AND THERAPEUTIC PARACENTESIS MEDICATIONS: 7 mL 1% lidocaine. COMPLICATIONS: None immediate. PROCEDURE: Informed written consent was obtained from the patient after a discussion of the risks, benefits and alternatives to treatment. A timeout was performed prior to the initiation of the procedure. Initial ultrasound scanning demonstrates a large amount of ascites within the right upper abdominal quadrant. The right upper abdomen was prepped and draped in the usual sterile fashion. 1% lidocaine was used for local anesthesia. Following this, a 19 gauge, 7-cm, Yueh catheter was introduced. An ultrasound image was saved for documentation purposes. The paracentesis was performed. The catheter was removed and a dressing was applied. The patient tolerated the procedure well without immediate post procedural complication. FINDINGS: A total of approximately 1.1 L of bloody fluid was removed. Samples were sent to the laboratory as requested by the clinical team. IMPRESSION: Successful ultrasound-guided paracentesis yielding 1.1 liters of peritoneal fluid. Performed by Lynnette Caffey, PA-C Electronically Signed   By: Malachy Moan M.D.   On: 08/18/2022 12:28      Impression    Likely acute on chronic pancreatitis secondary to alcohol use Last  consumption was Tuesday 08/18/22 CT Ab and pelvis 1. Poorly defined pancreatic head contour along the second portion of the duodenum likely due to persistent acute pancreatitis. Couple punctate calcific densities along the proximal pancreas likely representing chronic pancreatitis. 2. Slightly worsened mild thickening and haziness of the second portion of the duodenal wall. Finding likely representing reactive changes with developing duodenitis not excluded. -Smoking and alcohol cessation counseling. -Continue supportive care with pain control, fluid replacement with LR, early ambulation.  -Advance diet as tolerated.  -Trend CBC, CMP -Will follow along.  Ascites with SBP 6/23 1.1 L removed, greater than 20-50 PMNs and ascitic fluid Culture currently pending, can potentially narrow when on antibiotic based on culture SAAG less than 1, likely not portal hypertension and more so related to acute on chronic pancreatitis with possible malnutrition, albumin 2.1 Currently on cefepime WBC 13.1 Will likely need repeat paracentesis, 06/26 to evaluate for therapeutic response Pending cell cytology, called cytology, currently being reviewed by pathologist.   Acute on chronic normocytic anemia No overt GI bleeding 08/04/2021 EGD 3 cm hiatal hernia, normal esophagus, normal stomach, single duodenal flat polyp Patient is never had colonoscopy secondary to prep and socioeconomic status -IV Protonix twice daily possible duodenitis versus more likely reactive Pending FOBT -Can consider endoscopic evaluations hospital visit if patient continues to have decreasing hemoglobin or any overt GI bleeding  Leukocytosis with reactive thrombocytosis Continue cefepime  Elevated LFT AST 51, ALT 17, alk phos 79, total bilirubin 1.4 Likely secondary to alcohol and hepatic steatosis SAAG negative portal hypertension  mild protein calorie malnutrition Albumin 08/20/2022  2.1  BMI body mass index is 21.77 kg/m.   Secondary to ETOH - consider RD consult Count calories, increase protein  Chronic alcohol abuse with history of withdrawal -Empiric treatment with folic acid, multivitamin and thiamine.  -Alcohol Abstinence counseling discussed with patient -Monitor for withdrawal symptoms while inpatient   Active Problems:   Chronic anemia   Tobacco abuse   Essential hypertension   Chronic pain syndrome   Seizure (HCC)   Chronic alcohol abuse   Homeless   Hypokalemia  Hypomagnesemia   Sepsis (HCC)   Pancreatitis   SBP (spontaneous bacterial peritonitis) (HCC)   Hyponatremia   Acute pancreatitis    LOS: 2 days    Thank you for your kind consultation, we will continue to follow.   Doree Albee  08/20/2022, 9:10 AM

## 2022-08-20 NOTE — Progress Notes (Signed)
Nutrition Follow-up  DOCUMENTATION CODES:   Not applicable  INTERVENTION:  Encourage adequate PO intake Ensure Enlive po BID, each supplement provides 350 kcal and 20 grams of protein. MVI with minerals daily  NUTRITION DIAGNOSIS:   Inadequate oral intake related to inability to eat as evidenced by NPO status. - inadequate intake ongoing  GOAL:   Patient will meet greater than or equal to 90% of their needs - goal unmet  MONITOR:   Diet advancement  REASON FOR ASSESSMENT:   Consult Assessment of nutrition requirement/status  ASSESSMENT:   57 y.o. male admits related to abdominal pain and emesis. PMH includes: alcohol abuse, tobacco abuse, chronic pancreatitis, seizures. Pt is currently receiving medical management related to acute alcohol induced pancreatitis.  6/23 - s/p paracentesis yield 1.1L   6/25 - diet advanced to full liquids  Attempted to speak with pt at bedside. Unable to obtain detailed nutrition related history at this time.   He expressed frustrations over trying to sleep and constantly being interrupted, as well as wanting to eat more solid foods.  Reviewed weight history. Pt's weight noted to fluctuate within the last year, though overall trending down. Suspect current weight of 74.8 kg to be stated and carried forward from office visit on 5/22.  Medications: folvite, MVI, protonix, thiamine, IV abx  Labs: sodium 125, lipase 138, AST 51  NUTRITION - FOCUSED PHYSICAL EXAM: Deferred- pt declined d/t trying to sleep.    Diet Order:   Diet Order             Diet full liquid Room service appropriate? Yes; Fluid consistency: Thin  Diet effective now                   EDUCATION NEEDS:   Not appropriate for education at this time  Skin:  Skin Assessment: Reviewed RN Assessment  Last BM:  6/24 (type 7 large)  Height:   Ht Readings from Last 1 Encounters:  08/17/22 6\' 1"  (1.854 m)    Weight:   Wt Readings from Last 1 Encounters:   08/17/22 74.8 kg   BMI:  Body mass index is 21.77 kg/m.  Estimated Nutritional Needs:   Kcal:  2100-2300  Protein:  105-120g  Fluid:  >/=2L  Drusilla Kanner, RDN, LDN Clinical Nutrition

## 2022-08-20 NOTE — Plan of Care (Signed)

## 2022-08-20 NOTE — Progress Notes (Signed)
PROGRESS NOTE    Samuel Blevins  NGE:[id number] DOB: 13-Apr-1965 DOA: 08/17/2022 PCP: [name], MD   Brief Narrative:  57 y.o. male with medical history significant of alcohol abuse, tobacco abuse, chronic pancreatitis, chronic pain, hx of seizure d/o, homeless.     Presents with upper abdominal pain for the past 3 days.  Last alcohol intake was this morning.  Today has emesis and abdominal distention   History of alcohol abuse and chronic pancreatitis.  Pt reported nausea and vomiting over the past 2 days.  He reports an episode of bloody bowel movement.  No prior melena no shortness of breath or chest pain    Recently hospitalized for acute pancreatitis. He continues to consume alcohol.   Still smoking and drinking about 1 fifth per day and homeless   He was admitted with acute alcohol induced pancreatitis and has undergone paracentesis now demonstrating SBP.   Assessment & Plan:   Active Problems:   Chronic alcohol abuse   Chronic anemia   Chronic pain syndrome   Homeless   Tobacco abuse   Essential hypertension   Seizure (HCC)   Hypokalemia   Hypomagnesemia   Sepsis (HCC)   Pancreatitis   SBP (spontaneous bacterial peritonitis) (HCC)   Hyponatremia   Acute pancreatitis  Assessment and Plan:   Acute alcohol induced pancreatitis-improving - pt continues with heavy alcohol consumption despite recurrent bout of pancreatitis -ADAT and now on full liquid - aggressive IV fluid hydration  - IV pain and nausea management  - follow lipase until clinically improved - low threshold to transfer to higher level care if decompensates  Ascites with SBP - >250PMNs in ascitic fluid -likely related to pancreatitis and malnutrition - IR was consulted on admission for paracentesis with fluid testing, 1.1 L of fluid removed 6/23 - empirically was started on broad spectrum antibiotic coverage  -Culture currently pending -GI planning for repeat paracentesis on 6/26 with  repeat IV albumin   Chronic alcohol abuser - high risk for acute alcohol withdrawal  - agree with CIWA protocol with supplemental vitamins added   Leukocytosis -improving - possibly reactive to acute pancreatitis  - there was some concern for SBP on admission - he is being treated for pancreatitis and remains on IV antibiotics - check CBC in am   Hyponatremia  - multifactorial given acute dehydration, chronic alcoholism and liver disease  - hydrating with isotonic fluid - recheck labs in AM    Reactive thrombocytosis-improving - recheck CBC / diff in AM  - supportive management      History of alcohol withdrawal seizure - monitoring closely for acute alcohol withdrawal - maintain on CIWA protocol    Acute on chronic normocytic Anemia  - anticipating Hg will trend down with IV fluid hydration - Monitoring CBC  -FOBT per GI and will consider endoscopic evaluation prn   Homelessness - TOC consulted for assistance with resources    Tobacco User - nicotine patch ordered    DVT prophylaxis:SCDs Code Status: Full Family Communication: None at bedside Disposition Plan:  Status is: Inpatient Remains inpatient appropriate because: Need for IV fluid and medications.   Consultants:  IR GI  Procedures:  Paracentesis 6/23  Antimicrobials:  Anti-infectives (From admission, onward)    Start     Dose/Rate Route Frequency Ordered Stop   08/18/22 0600  metroNIDAZOLE (FLAGYL) IVPB 500 mg        500 mg 100 mL/hr over 60 Minutes Intravenous Every 12 hours 08/17/22 1946 08/25/22  0559   08/18/22 0400  ceFEPIme (MAXIPIME) 2 g in sodium chloride 0.9 % 100 mL IVPB        2 g 200 mL/hr over 30 Minutes Intravenous Every 8 hours 08/17/22 2008 08/25/22 0359   08/17/22 1845  ceFEPIme (MAXIPIME) 2 g in sodium chloride 0.9 % 100 mL IVPB  Status:  Discontinued        2 g 200 mL/hr over 30 Minutes Intravenous  Once 08/17/22 1834 08/17/22 1836   08/17/22 1845  metroNIDAZOLE (FLAGYL) IVPB  500 mg        500 mg 100 mL/hr over 60 Minutes Intravenous  Once 08/17/22 1834 08/17/22 2002   08/17/22 1845  ceFEPIme (MAXIPIME) 2 g in sodium chloride 0.9 % 100 mL IVPB  Status:  Discontinued        2 g 200 mL/hr over 30 Minutes Intravenous Every 24 hours 08/17/22 1836 08/17/22 2008      Subjective: Patient seen and evaluated today with improvement in abdominal symptoms noted with less pain and no nausea or vomiting.  He is complaining of some joint aches and pains. He would like full liquid diet.  Objective: Vitals:   08/19/22 1549 08/19/22 1928 08/20/22 0055 08/20/22 0544  BP: 130/82 (!) 121/91 (!) 141/85 132/81  Pulse: (!) 117 (!) 116 (!) 116 (!) 110  Resp: 16 19 19 19   Temp:  99.5 F (37.5 C) 99.8 F (37.7 C) 98.7 F (37.1 C)  TempSrc:  Oral Oral Oral  SpO2: 90% 91% 91% 91%  Weight:      Height:        Intake/Output Summary (Last 24 hours) at 08/20/2022 0659 Last data filed at 08/20/2022 0549 Gross per 24 hour  Intake 906.03 ml  Output 900 ml  Net 6.03 ml   Filed Weights   08/17/22 1615  Weight: 74.8 kg    Examination:  General exam: Appears calm and comfortable  Respiratory system: Clear to auscultation. Respiratory effort normal. Cardiovascular system: S1 & S2 heard, RRR.  Gastrointestinal system: Abdomen is minimally distended.  Minimally tender to palpation. Central nervous system: Alert and awake Extremities: No edema Skin: No significant lesions noted Psychiatry: Flat affect.    Data Reviewed: I have personally reviewed following labs and imaging studies  CBC: Recent Labs  Lab 08/17/22 1623 08/17/22 1746 08/17/22 2040 08/18/22 0245 08/18/22 1824 08/19/22 0033 08/20/22 0026  WBC 15.6*  --  14.2* 18.3*  --  17.6* 13.1*  NEUTROABS 14.2*  --   --  15.9*  --  15.3*  --   HGB 13.1   < > 11.3* 10.9* 10.1* 9.5* 9.0*  HCT 37.9*   < > 34.0* 31.3* 29.6* 28.0* 26.5*  MCV 86.9  --  88.1 85.5  --  88.6 86.9  PLT 589*  --  469* 497*  --  433* 424*   <  > = values in this interval not displayed.   Basic Metabolic Panel: Recent Labs  Lab 08/17/22 1623 08/17/22 1746 08/17/22 2040 08/18/22 0245 08/19/22 0033 08/20/22 0026  NA 125* 125* 126* 125* 126* 125*  K 3.9 3.4* 3.0* 3.2* 3.2* 3.5  CL 80* 82* 84* 85* 88* 91*  CO2 17*  --  21* 21* 22 20*  GLUCOSE 98 100* 89 95 87 84  BUN 23* 30* 21* 23* 22* 18  CREATININE 1.17 1.20 1.00 1.24 0.97 1.04  CALCIUM 7.8*  --  7.3* 7.3* 7.2* 7.3*  MG  --   --  1.2* 1.9  1.6* 2.0  PHOS  --   --  4.3 4.2  --   --    GFR: Estimated Creatinine Clearance: 81.9 mL/min (by C-G formula based on SCr of 1.04 mg/dL). Liver Function Tests: Recent Labs  Lab 08/17/22 1623 08/17/22 2040 08/18/22 0245 08/19/22 0033 08/20/22 0026  AST 52* 32 30 27 51*  ALT 20 15 18 14 17   ALKPHOS 110 93 85 75 79  BILITOT 2.6* 1.3* 2.1* 1.3* 1.4*  PROT 7.4 6.6 6.5 5.7* 5.8*  ALBUMIN 3.1* 2.6* 2.5* 2.0* 2.1*   Recent Labs  Lab 08/17/22 1623 08/19/22 0033 08/20/22 0026  LIPASE 767* 285* 138*   Recent Labs  Lab 08/17/22 2040  AMMONIA 28   Coagulation Profile: Recent Labs  Lab 08/17/22 2040  INR 1.1   Cardiac Enzymes: Recent Labs  Lab 08/17/22 2040  CKTOTAL 68   BNP (last 3 results) No results for input(s): "PROBNP" in the last 8760 hours. HbA1C: No results for input(s): "HGBA1C" in the last 72 hours. CBG: Recent Labs  Lab 08/17/22 2150  GLUCAP 108*   Lipid Profile: No results for input(s): "CHOL", "HDL", "LDLCALC", "TRIG", "CHOLHDL", "LDLDIRECT" in the last 72 hours. Thyroid Function Tests: Recent Labs    08/17/22 2040  TSH 1.859   Anemia Panel: No results for input(s): "VITAMINB12", "FOLATE", "FERRITIN", "TIBC", "IRON", "RETICCTPCT" in the last 72 hours. Sepsis Labs: Recent Labs  Lab 08/17/22 1717 08/17/22 1835 08/17/22 2040 08/18/22 0043  PROCALCITON  --   --  0.22  --   LATICACIDVEN 3.0* 2.3* 2.6* 1.2    Recent Results (from the past 240 hour(s))  Culture, blood (routine x 2)      Status: None (Preliminary result)   Collection Time: 08/17/22  5:17 PM   Specimen: BLOOD RIGHT ARM  Result Value Ref Range Status   Specimen Description BLOOD RIGHT ARM  Final   Special Requests   Final    BOTTLES DRAWN AEROBIC AND ANAEROBIC Blood Culture adequate volume   Culture   Final    NO GROWTH 2 DAYS Performed at [facility], [address], [place], [place] [address]    Report Status PENDING  Incomplete  Culture, blood (routine x 2)     Status: None (Preliminary result)   Collection Time: 08/17/22  5:56 PM   Specimen: BLOOD LEFT ARM  Result Value Ref Range Status   Specimen Description BLOOD LEFT ARM  Final   Special Requests   Final    BOTTLES DRAWN AEROBIC AND ANAEROBIC Blood Culture results may not be optimal due to an excessive volume of blood received in culture bottles   Culture   Final    NO GROWTH 2 DAYS Performed at [facility], [address] [place], [place] [address]    Report Status PENDING  Incomplete  Urine Culture     Status: Abnormal   Collection Time: 08/17/22  6:45 PM   Specimen: Urine, Random  Result Value Ref Range Status   Specimen Description URINE, RANDOM  Final   Special Requests   Final    URINE, CLEAN CATCH Performed at [facility], [address], [place], [place] [address]    Culture 90,000 COLONIES/mL CITROBACTER KOSERI (A)  Final   Report Status 08/19/2022 FINAL  Final   Organism ID, Bacteria CITROBACTER KOSERI (A)  Final      Susceptibility   Citrobacter koseri - MIC*    CEFEPIME <=0.12 SENSITIVE Sensitive     CEFTRIAXONE <=  0.25 SENSITIVE Sensitive     CIPROFLOXACIN <=0.25 SENSITIVE Sensitive     GENTAMICIN <=1 SENSITIVE Sensitive     IMIPENEM <=0.25 SENSITIVE Sensitive     NITROFURANTOIN <=16 SENSITIVE Sensitive     TRIMETH/SULFA <=20 SENSITIVE Sensitive     PIP/TAZO <=4 SENSITIVE Sensitive     * 90,000 COLONIES/mL CITROBACTER KOSERI  Body fluid culture w Gram Stain     Status: None (Preliminary result)    Collection Time: 08/18/22 12:00 PM   Specimen: PATH Cytology Peritoneal fluid  Result Value Ref Range Status   Specimen Description PERITONEAL  Final   Special Requests NONE  Final   Gram Stain   Final    RARE WBC PRESENT, PREDOMINANTLY PMN NO ORGANISMS SEEN    Culture   Final    NO GROWTH < 24 HOURS Performed at [facility], [address] [place], [place] [address]    Report Status PENDING  Incomplete  Anaerobic culture w Gram Stain     Status: None (Preliminary result)   Collection Time: 08/18/22 12:00 PM   Specimen: Peritoneal Washings  Result Value Ref Range Status   Specimen Description PERITONEAL  Final   Special Requests NONE  Final   Gram Stain   Final    RARE WBC PRESENT, PREDOMINANTLY PMN NO ORGANISMS SEEN Performed at [facility], [address], [place], [place] [address]    Culture PENDING  Incomplete   Report Status PENDING  Incomplete         Radiology Studies: CT ABDOMEN PELVIS W CONTRAST  Result Date: 08/18/2022 CLINICAL DATA:  Pancreatitis, acute, severe EXAM: CT ABDOMEN AND PELVIS WITH CONTRAST TECHNIQUE: Multidetector CT imaging of the abdomen and pelvis was performed using the standard protocol following bolus administration of intravenous contrast. RADIATION DOSE REDUCTION: This exam was performed according to the departmental dose-optimization program which includes automated exposure control, adjustment of the mA and/or kV according to patient size and/or use of iterative reconstruction technique. CONTRAST:  75mL OMNIPAQUE IOHEXOL 350 MG/ML SOLN COMPARISON:  Abdomen pelvis 08/17/2022 FINDINGS: Lower chest: Bilateral trace pleural effusions. Bilateral lower lobe atelectasis. Hepatobiliary: No focal liver abnormality. No gallstones, gallbladder wall thickening, or pericholecystic fluid. No biliary dilatation. Pancreas: Redemonstration of poorly defined pancreatic head contour along the second portion of the duodenum. Couple punctate calcific  densities along the proximal pancreas likely related to the parenchyma with no definite intraductal calcification. No main pancreatic ductal dilatation. No pseudocyst formation. Spleen: Normal in size without focal abnormality. Adrenals/Urinary Tract: No adrenal nodule bilaterally. Bilateral kidneys enhance symmetrically. No hydronephrosis. No hydroureter. The urinary bladder is unremarkable. On delayed imaging, there is no urothelial wall thickening and there are no filling defects in the opacified portions of the bilateral collecting systems or ureters. Stomach/Bowel: Stomach is within normal limits. Slightly worsened mild thickening and haziness of the second portion of the duodenal wall. No discontinuity of the duodenal wall. No evidence of large bowel wall thickening or dilatation. Appendix appears normal. Vascular/Lymphatic: The main portal, splenic, superior mesenteric veins are patent. No abdominal aorta or iliac aneurysm. Mild to moderate atherosclerotic plaque of the aorta and its branches. No abdominal, pelvic, or inguinal lymphadenopathy. Reproductive: Prostate is enlarged measuring up to 6 cm. Other: Grossly stable small to moderate volume simple free fluid ascites. No intraperitoneal free gas. No organized fluid collection. Musculoskeletal: No abdominal wall hernia or abnormality. No suspicious lytic or blastic osseous lesions. No acute displaced fracture. IMPRESSION: 1. Poorly defined pancreatic head contour  along the second portion of the duodenum likely due to persistent acute pancreatitis. Couple punctate calcific densities along the proximal pancreas likely representing chronic pancreatitis. 2. Slightly worsened mild thickening and haziness of the second portion of the duodenal wall. Finding likely representing reactive changes with developing duodenitis not excluded. 3. Interval development of bilateral trace pleural effusions. 4. Grossly stable small to moderate volume simple free fluid ascites.  5. Prostatomegaly. Electronically Signed   By: Tish Frederickson M.D.   On: 08/18/2022 21:37   US Paracentesis  Result Date: 08/18/2022 INDICATION: Patient with history of ETOH abuse, chronic pancreatitic, seizures admitted with acute pancreatitis and found to have new onset ascites. Request for diagnostic and therapeutic paracentesis. EXAM: ULTRASOUND GUIDED DIAGNOSTIC AND THERAPEUTIC PARACENTESIS MEDICATIONS: 7 mL 1% lidocaine. COMPLICATIONS: None immediate. PROCEDURE: Informed written consent was obtained from the patient after a discussion of the risks, benefits and alternatives to treatment. A timeout was performed prior to the initiation of the procedure. Initial ultrasound scanning demonstrates a large amount of ascites within the right upper abdominal quadrant. The right upper abdomen was prepped and draped in the usual sterile fashion. 1% lidocaine was used for local anesthesia. Following this, a 19 gauge, 7-cm, Yueh catheter was introduced. An ultrasound image was saved for documentation purposes. The paracentesis was performed. The catheter was removed and a dressing was applied. The patient tolerated the procedure well without immediate post procedural complication. FINDINGS: A total of approximately 1.1 L of bloody fluid was removed. Samples were sent to the laboratory as requested by the clinical team. IMPRESSION: Successful ultrasound-guided paracentesis yielding 1.1 liters of peritoneal fluid. Performed by Lynnette Caffey, PA-C Electronically Signed   By: Malachy Moan M.D.   On: 08/18/2022 12:28   EEG adult  Result Date: 08/18/2022 Charlsie Quest, MD     08/18/2022  7:37 AM Patient Name: Staci Carver MRN: [id number] Epilepsy Attending: Charlsie Quest Referring Physician/Provider: Therisa Doyne, MD Date: 08/18/2022 Duration: 27.58 mins Patient history: 56yo M with h/o seizures getting eeg to evaluate for seizure. Level of alertness: Awake AEDs during EEG study: None  Technical aspects: This EEG study was done with scalp electrodes positioned according to the 10-20 International system of electrode placement. Electrical activity was reviewed with band pass filter of 1-70Hz , sensitivity of 7 uV/mm, display speed of 106mm/sec with a 60Hz  notched filter applied as appropriate. EEG data were recorded continuously and digitally stored.  Video monitoring was available and reviewed as appropriate. Description: No clear posterior dominant rhythm was seen. EEG showed continuous generalized 3 to 6 Hz theta-delta slowing. Photic driving was not seen during photic stimulation. Hyperventilation was not performed.   Of note, eeg was technically difficult due to significant myogenic artifact. ABNORMALITY - Continuous slow, generalized IMPRESSION: This technically difficult study is suggestive of moderate diffuse encephalopathy, nonspecific etiology. No seizures or epileptiform discharges were seen throughout the recording. Please note lack of epileptiform discharges during interictal EEG does not exclude the diagnosis of epilepsy. Priyanka Annabelle Harman        Scheduled Meds:  carvedilol  3.125 mg Oral BID WC   folic acid  1 mg Oral Daily   multivitamin with minerals  1 tablet Oral Daily   nicotine  21 mg Transdermal Daily   pantoprazole (PROTONIX) IV  40 mg Intravenous QHS   thiamine  100 mg Oral Daily   Continuous Infusions:  sodium chloride 150 mL/hr at 08/19/22 2303   ceFEPime (MAXIPIME) IV 2 g (08/20/22 0416)  metronidazole 500 mg (08/20/22 7829)     LOS: 2 days    Time spent: 35 minutes    Emya Picado D Sherryll Burger, DO [facility]  If 7PM-7AM, please contact night-coverage [email or web address] 08/20/2022, 6:59 AM

## 2022-08-21 ENCOUNTER — Inpatient Hospital Stay (HOSPITAL_COMMUNITY): Payer: Medicaid Other

## 2022-08-21 DIAGNOSIS — D649 Anemia, unspecified: Secondary | ICD-10-CM | POA: Diagnosis not present

## 2022-08-21 DIAGNOSIS — K652 Spontaneous bacterial peritonitis: Secondary | ICD-10-CM | POA: Diagnosis not present

## 2022-08-21 DIAGNOSIS — K852 Alcohol induced acute pancreatitis without necrosis or infection: Secondary | ICD-10-CM | POA: Diagnosis not present

## 2022-08-21 DIAGNOSIS — F1010 Alcohol abuse, uncomplicated: Secondary | ICD-10-CM | POA: Diagnosis not present

## 2022-08-21 DIAGNOSIS — K859 Acute pancreatitis without necrosis or infection, unspecified: Secondary | ICD-10-CM | POA: Diagnosis not present

## 2022-08-21 DIAGNOSIS — E876 Hypokalemia: Secondary | ICD-10-CM

## 2022-08-21 HISTORY — PX: IR PARACENTESIS: IMG2679

## 2022-08-21 LAB — BODY FLUID CELL COUNT WITH DIFFERENTIAL
Eos, Fluid: 0 %
Lymphs, Fluid: 4 %
Monocyte-Macrophage-Serous Fluid: 14 % — ABNORMAL LOW (ref 50–90)
Neutrophil Count, Fluid: 82 % — ABNORMAL HIGH (ref 0–25)
Total Nucleated Cell Count, Fluid: 13095 cu mm — ABNORMAL HIGH (ref 0–1000)

## 2022-08-21 LAB — LIPASE, BLOOD: Lipase: 140 U/L — ABNORMAL HIGH (ref 11–51)

## 2022-08-21 LAB — MAGNESIUM: Magnesium: 1.8 mg/dL (ref 1.7–2.4)

## 2022-08-21 LAB — AMYLASE, PLEURAL OR PERITONEAL FLUID: Amylase, Fluid: 648 U/L

## 2022-08-21 LAB — COMPREHENSIVE METABOLIC PANEL
ALT: 18 U/L (ref 0–44)
AST: 66 U/L — ABNORMAL HIGH (ref 15–41)
Albumin: 1.9 g/dL — ABNORMAL LOW (ref 3.5–5.0)
Alkaline Phosphatase: 77 U/L (ref 38–126)
Anion gap: 10 (ref 5–15)
BUN: 12 mg/dL (ref 6–20)
CO2: 22 mmol/L (ref 22–32)
Calcium: 7.3 mg/dL — ABNORMAL LOW (ref 8.9–10.3)
Chloride: 96 mmol/L — ABNORMAL LOW (ref 98–111)
Creatinine, Ser: 0.92 mg/dL (ref 0.61–1.24)
GFR, Estimated: >60 mL/min (ref >60)
Glucose, Bld: 100 mg/dL — ABNORMAL HIGH (ref 70–99)
Potassium: 3.4 mmol/L — ABNORMAL LOW (ref 3.5–5.1)
Sodium: 128 mmol/L — ABNORMAL LOW (ref 135–145)
Total Bilirubin: 0.8 mg/dL (ref 0.3–1.2)
Total Protein: 5.5 g/dL — ABNORMAL LOW (ref 6.5–8.1)

## 2022-08-21 LAB — CBC
HCT: 26.3 % — ABNORMAL LOW (ref 39.0–52.0)
Hemoglobin: 8.6 g/dL — ABNORMAL LOW (ref 13.0–17.0)
MCH: 29.6 pg (ref 26.0–34.0)
MCHC: 32.7 g/dL (ref 30.0–36.0)
MCV: 90.4 fL (ref 80.0–100.0)
Platelets: 404 10*3/uL — ABNORMAL HIGH (ref 150–400)
RBC: 2.91 MIL/uL — ABNORMAL LOW (ref 4.22–5.81)
RDW: 21.5 % — ABNORMAL HIGH (ref 11.5–15.5)
WBC: 9.7 10*3/uL (ref 4.0–10.5)
nRBC: 0 % (ref 0.0–0.2)

## 2022-08-21 LAB — BODY FLUID CULTURE W GRAM STAIN

## 2022-08-21 LAB — CULTURE, BLOOD (ROUTINE X 2): Culture: NO GROWTH

## 2022-08-21 MED ORDER — VANCOMYCIN HCL 1500 MG/300ML IV SOLN
1500.0000 mg | Freq: Once | INTRAVENOUS | Status: AC
  Administered 2022-08-21: 1500 mg via INTRAVENOUS
  Filled 2022-08-21 (×3): qty 300

## 2022-08-21 MED ORDER — VANCOMYCIN HCL IN DEXTROSE 1-5 GM/200ML-% IV SOLN
1000.0000 mg | Freq: Two times a day (BID) | INTRAVENOUS | Status: DC
  Filled 2022-08-21: qty 200

## 2022-08-21 MED ORDER — POTASSIUM CHLORIDE CRYS ER 20 MEQ PO TBCR
40.0000 meq | EXTENDED_RELEASE_TABLET | Freq: Once | ORAL | Status: AC
  Administered 2022-08-21: 40 meq via ORAL
  Filled 2022-08-21: qty 2

## 2022-08-21 MED ORDER — IOHEXOL 350 MG/ML SOLN
75.0000 mL | Freq: Once | INTRAVENOUS | Status: AC | PRN
  Administered 2022-08-21: 75 mL via INTRAVENOUS

## 2022-08-21 MED ORDER — LIDOCAINE HCL 1 % IJ SOLN
INTRAMUSCULAR | Status: AC
  Filled 2022-08-21: qty 20

## 2022-08-21 NOTE — Procedures (Signed)
PROCEDURE SUMMARY:  Successful US guided paracentesis from right abdomen.  Yielded 1 L of bloody fluid.  No immediate complications.  Pt tolerated well.   Specimen sent for labs.  EBL < 2 mL  Mickie Kay, NP 08/21/2022 3:10 PM

## 2022-08-21 NOTE — Plan of Care (Signed)

## 2022-08-21 NOTE — Progress Notes (Signed)
Physical Therapy Treatment Patient Details Name: Samuel Blevins MRN: [id number] DOB: 18-Jul-1965 Today's Date: 08/21/2022   History of Present Illness 57 y.o. male with medical history significant of alcohol abuse, tobacco abuse, chronic pancreatitis, chronic pain, hx of seizure do, homeless.  Presented with  abd pain.    PT Comments    Pt was seen for attempt to get him up to walk as this is a hindrance to SNF placement.  Pt was able to stand and walk a trip in room without walker, but was a bit upset about being asked.  Pt was able to get down the hallway and back with no LOB, dropped walker at bedside and crawled into bed to lie on R side.  Will recommend him to upgrade his care needs as he is now walking without assist of PT directly other than with IV lines and managing his catheter.  Pt is otherwise moving without help, and have updated his recommendations accordingly.  Pt is concerned with his clothing from admission and may need some help with obtaining garments for his discharge when appropriate.   Recommendations for follow up therapy are one component of a multi-disciplinary discharge planning process, led by the attending physician.  Recommendations may be updated based on patient status, additional functional criteria and insurance authorization.  Follow Up Recommendations  Can patient physically be transported by private vehicle: Yes    Assistance Recommended at Discharge PRN  Patient can return home with the following Assist for transportation   Equipment Recommendations  None recommended by PT    Recommendations for Other Services       Precautions / Restrictions Precautions Precautions: Fall Precaution Comments: abd pain Restrictions Weight Bearing Restrictions: No     Mobility  Bed Mobility Overal bed mobility: Modified Independent             General bed mobility comments: up to side of bed without help, crawled back to bed alone     Transfers Overall transfer level: Needs assistance Equipment used: Rollator (4 wheels) Transfers: Sit to/from Stand Sit to Stand: Supervision           General transfer comment: supervision for lines only    Ambulation/Gait Ambulation/Gait assistance: Supervision Gait Distance (Feet): 100 Feet Assistive device: Rollator (4 wheels) Gait Pattern/deviations: Step-through pattern Gait velocity: WFL Gait velocity interpretation: <1.31 ft/sec, indicative of household ambulator Pre-gait activities: unable as pt immediately got up and began to walk General Gait Details: Pt is up to walk and walking quickly, asking loudly to speak to the nursing director to ask for PT not to see him again   Stairs             Wheelchair Mobility    Modified Rankin (Stroke Patients Only)       Balance Overall balance assessment: Needs assistance Sitting-balance support: Feet supported Sitting balance-Leahy Scale: Good     Standing balance support: No upper extremity supported, Bilateral upper extremity supported Standing balance-Leahy Scale: Fair Standing balance comment: fair balance with pt walking without AD initially then with rollator                            Cognition Arousal/Alertness: Awake/alert Behavior During Therapy: Flat affect, Agitated (agitated about asking him to take a walk) Overall Cognitive Status: No family/caregiver present to determine baseline cognitive functioning Area of Impairment: Safety/judgement, Following commands  Following Commands: Follows one step commands with increased time, Follows one step commands inconsistently Safety/Judgement: Decreased awareness of safety Awareness: Emergent, Intellectual Problem Solving: Slow processing General Comments: pt is agitated about using walker and getting OOB. Stands without help, pulled off telemetry leads and jumped up to stand, walked to other side of bed and  grabbed walker.  After walk pushed walker away and crawled on hands and knees into bed, flopped onto R side        Exercises      General Comments General comments (skin integrity, edema, etc.): Pt is up to walk with PT, a bit upset at being asked if he can try to take steps.  Demonstrating abiltiy to maneuver Rollator but stands with no assist, walking short trips without walker and returned to bed without assist.      Pertinent Vitals/Pain Pain Assessment Pain Assessment: Faces Faces Pain Scale: Hurts a little bit Pain Location: abdominal    Home Living                          Prior Function            PT Goals (current goals can now be found in the care plan section) Acute Rehab PT Goals Patient Stated Goal: decrease pain Progress towards PT goals: Progressing toward goals    Frequency    Min 1X/week      PT Plan Current plan remains appropriate    Co-evaluation              AM-PAC PT "6 Clicks" Mobility   Outcome Measure  Help needed turning from your back to your side while in a flat bed without using bedrails?: None Help needed moving from lying on your back to sitting on the side of a flat bed without using bedrails?: None Help needed moving to and from a bed to a chair (including a wheelchair)?: A Little Help needed standing up from a chair using your arms (e.g., wheelchair or bedside chair)?: A Little Help needed to walk in hospital room?: A Little Help needed climbing 3-5 steps with a railing? : A Little 6 Click Score: 20    End of Session Equipment Utilized During Treatment: Gait belt Activity Tolerance: Patient tolerated treatment well Patient left: in bed;with call bell/phone within reach;with bed alarm set Nurse Communication: Mobility status PT Visit Diagnosis: Difficulty in walking, not elsewhere classified (R26.2);Muscle weakness (generalized) (M62.81)     Time: 8295-6213 PT Time Calculation (min) (ACUTE ONLY): 17  min  Charges:  $Gait Training: 8-22 mins         Samuel Blevins 08/21/2022, 12:53 PM  Samuel Blevins, PT PhD Acute Rehab Dept. Number: [facility] [id number] and [facility] [phone]

## 2022-08-21 NOTE — Progress Notes (Signed)
Pharmacy Antibiotic Note  Samuel Blevins is a 57 y.o. male who continues on cefepime for  IAI / pancreatitis.  Day # 4/7.  Also noted to have pan sensitive citrobacter in UCx.    08/21/22:   Pharmacy consulted to start Vancomycin IV for SBP.  Renal fxn remains stable.  WBC improved to withn normal. S/p successful US guided paracentesis from right abdomen on 6/26.  Yielded 1 L of bloody fluid. Peritoneal fluid culture sent.  Blood culture no growth to date.    Estimated Creatinine Clearance: 92.6 mL/min (by C-G formula based on SCr of 0.92 mg/dL).  Plan: Start Vancomycin 1500 mg then 1000 mg IV Q 12h hrs. Goal AUC 400-550. Expected AUC: 457 SCr used: 0.92, use TBW 74.8 kg (due to TBW <IBW 79.9kg) Continue Cefepime 2g q8hr  Metronidazole 500mg  IV q12h Follow-up toleration of PO to change meds to PO route Monitor WBC, fever, renal function, cultures De-escalate when able   Height: 6\' 1"  (185.4 cm) Weight: 74.8 kg (164 lb 14.5 oz) IBW/kg (Calculated) : 79.9  Temp (24hrs), Avg:98.9 F (37.2 C), Min:98.7 F (37.1 C), Max:99.3 F (37.4 C)  Recent Labs  Lab 08/17/22 1717 08/17/22 1746 08/17/22 1835 08/17/22 2040 08/18/22 0043 08/18/22 0245 08/19/22 0033 08/20/22 0026 08/21/22 0051  WBC  --   --   --  14.2*  --  18.3* 17.6* 13.1* 9.7  CREATININE  --    < >  --  1.00  --  1.24 0.97 1.04 0.92  LATICACIDVEN 3.0*  --  2.3* 2.6* 1.2  --   --   --   --    < > = values in this interval not displayed.     Estimated Creatinine Clearance: 92.6 mL/min (by C-G formula based on SCr of 0.92 mg/dL).    No Known Allergies  Abx doses and Microbiology results: Cefepime 6/22> Metronidazole 6/22>   6/22 Ucx: citrobacter koseri (pan-S) 6/22 Bcx: ngtd x 4 days  6/23 peritoneal washing: no gtd 6/26 peritoneal fluid Cx:  pending   Thank you for allowing pharmacy to be a part of this patient's care.  Noah Delaine, RPh Clinical Pharmacist Clinical phone for 08/21/2022 from 7:30-3:00  is [phone].  **Pharmacist phone directory can be found on [email or web address] listed under [facility].  08/21/2022 6:16 PM

## 2022-08-21 NOTE — Progress Notes (Signed)
Progress Note  Primary GI: Dr. Tomasa Rand   Subjective  Chief Complaint: Acute on chronic pancreatitis due to ETOH with ascites and SBP   No family was present at the time of my evaluation. Patient states he did have some fevers and chills last night though leukocytosis is downtrending.  States he is severe abdominal pain just received pain medications but still in pain and feels that it is larger than prior.  Had bowel movement this morning denies melena or hematochezia.  Has had some shortness of breath but denies chest pain.    Objective   Vital signs in last 24 hours: Temp:  [98.8 F (37.1 C)-99.6 F (37.6 C)] 98.8 F (37.1 C) (06/26 1132) Pulse Rate:  [104-119] 104 (06/26 1132) Resp:  [13-20] 13 (06/26 1132) BP: (111-131)/(49-87) 129/87 (06/26 1132) SpO2:  [91 %-95 %] 93 % (06/26 1132) Weight:  [74.8 kg] 74.8 kg (06/26 0438) Last BM Date : 08/20/22 Last BM recorded by nurses in past 5 days Stool Type: Type 6 (Mushy consistency with ragged edges) (08/21/2022 12:31 PM)  General:   Chronically ill-appearing young African-American male,no distress. Heart:  regular rate and rhythm Pulm: Clear anteriorly; no wheezing Abdomen:  Distended, Obese AB, Active bowel sounds. + tenderness . With guarding and Without rebound,  Extremities:  Without edema. Msk:  Symmetrical without gross deformities. Peripheral pulses intact.  Neurologic:  Alert and  oriented x4;  No focal deficits.  Skin:   Dry and intact without significant lesions or rashes. Psychiatric:  Cooperative. Normal mood and affect.  Intake/Output from previous day: 06/25 0701 - 06/26 0700 In: 2291.5 [I.V.:1800; IV Piggyback:491.5] Out: 1650 [Urine:1650] Intake/Output this shift: Total I/O In: 891.5 [I.V.:891.5] Out: -   Studies/Results: No results found.  Lab Results: Recent Labs    08/19/22 0033 08/20/22 0026 08/21/22 0051  WBC 17.6* 13.1* 9.7  HGB 9.5* 9.0* 8.6*  HCT 28.0* 26.5* 26.3*  PLT 433* 424*  404*   BMET Recent Labs    08/19/22 0033 08/20/22 0026 08/21/22 0051  NA 126* 125* 128*  K 3.2* 3.5 3.4*  CL 88* 91* 96*  CO2 22 20* 22  GLUCOSE 87 84 100*  BUN 22* 18 12  CREATININE 0.97 1.04 0.92  CALCIUM 7.2* 7.3* 7.3*   LFT Recent Labs    08/21/22 0051  PROT 5.5*  ALBUMIN 1.9*  AST 66*  ALT 18  ALKPHOS 77  BILITOT 0.8   PT/INR Recent Labs    08/20/22 1200  LABPROT 14.4  INR 1.1     Scheduled Meds:  carvedilol  3.125 mg Oral BID WC   feeding supplement  237 mL Oral BID BM   folic acid  1 mg Oral Daily   multivitamin with minerals  1 tablet Oral Daily   nicotine  21 mg Transdermal Daily   pantoprazole (PROTONIX) IV  40 mg Intravenous Q12H   thiamine  100 mg Oral Daily   Continuous Infusions:  sodium chloride 150 mL/hr at 08/21/22 1249   ceFEPime (MAXIPIME) IV 2 g (08/21/22 1215)   metronidazole 500 mg (08/21/22 1610)     Impression/Plan:   Likely acute on chronic pancreatitis secondary to alcohol use Last consumption was Tuesday 08/18/22 CT Ab and pelvis 1. Poorly defined pancreatic head contour along the second portion of the duodenum likely due to persistent acute pancreatitis. Couple punctate calcific densities along the proximal pancreas likely representing chronic pancreatitis. 2. Slightly worsened mild thickening and haziness of the second portion of the  duodenal wall. Finding likely representing reactive changes with developing duodenitis not excluded. -Smoking and alcohol cessation counseling. -Continue supportive care with pain control, fluid replacement with LR, early ambulation.  -Advance diet as tolerated.  -Trend CBC, CMP -Will follow along.   Ascites with SBP 6/23 1.1 L removed, greater than 20-50 PMNs and ascitic fluid Culture with rare WBC, no organisms SAAG less than 1, not portal hypertension  Currently on cefepime/flagyl WBC 13.1 down trending but patient with fever/chills and AB pain Repeat paracentesis today to evaluate  for therapeutic response, will repeat cell count, add amylase, and culture   Acute on chronic normocytic anemia No overt GI bleeding 08/04/2021 EGD 3 cm hiatal hernia, normal esophagus, normal stomach, single duodenal flat polyp Patient  never had colonoscopy secondary to prep and socioeconomic status -IV Protonix twice daily possible duodenitis versus more likely reactive -Can consider endoscopic evaluations hospital visit if patient continues to have decreasing hemoglobin or any overt GI bleeding   Leukocytosis with reactive thrombocytosis Continue cefepime. Down trending   Elevated LFT AST 51, ALT 17, alk phos 79, total bilirubin 1.4 Likely secondary to alcohol and hepatic steatosis SAAG negative portal hypertension   mild protein calorie malnutrition.  Secondary to ETOH - consider RD consult Count calories, increase protein   Chronic alcohol abuse with history of withdrawal -Empiric treatment with folic acid, multivitamin and thiamine.  -Alcohol Abstinence counseling discussed with patient -Monitor for withdrawal symptoms while inpatient     Active Problems:   Chronic anemia   Tobacco abuse   Essential hypertension   Chronic pain syndrome   Seizure (HCC)   Chronic alcohol abuse   Homeless   Hypokalemia   Hypomagnesemia   Sepsis (HCC)   Pancreatitis   SBP (spontaneous bacterial peritonitis) (HCC)   Hyponatremia   Acute pancreatitis    LOS: 3 days   Doree Albee  08/21/2022, 12:58 PM

## 2022-08-21 NOTE — Progress Notes (Signed)
[facility]                                                                               Samuel Blevins, is a 57 y.o. male, DOB - 07/21/1965, VWU:[id number] Admit date - 08/17/2022    Outpatient Primary MD for the patient is [name], MD  LOS - 3  days    Brief summary   57 y.o. male with medical history significant of alcohol abuse, tobacco abuse, chronic pancreatitis, chronic pain, hx of seizure d/o, homeless.     Presents with upper abdominal pain for the past 3 days.  Last alcohol intake was this morning.  Today has emesis and abdominal distention   History of alcohol abuse and chronic pancreatitis.  Pt reported nausea and vomiting over the past 2 days.  He reports an episode of bloody bowel movement.  No prior melena no shortness of breath or chest pain    Recently hospitalized for acute pancreatitis. He continues to consume alcohol.   Still smoking and drinking about 1 fifth per day and homeless   He was admitted with acute alcohol induced pancreatitis and has undergone paracentesis now demonstrating SBP.   Assessment & Plan    Assessment and Plan:  Acute alcohol induced pancreatitis/chronic alcohol abuse with history of withdrawals Monitor for withdrawal symptoms. Alcohol abstinence counseling given. Patient continues to report pain. Currently on IV fluids and IV pain management. GI on board and appreciate recommendations.   SBP GI on board recommending diagnostic and therapeutic paracentesis with IV albumin today. Patient is currently on empiric IV antibiotics for SBP He will ultimately need SBP prophylaxis with either Bactrim or ciprofloxacin as per Gi recommendations.    Acute normocytic anemia Currently on IV PPI twice daily Defer endoscopic evaluations to GI.    Leukocytosis Improving.    Elevated liver enzymes probably secondary to alcohol hepatitis and hepatic steatosis.    Mild protein calorie malnutrition Nutritionist  will be consulted.  Homelessness TOC on board.   Tobacco abuse:  On patch.   Hyponatremia:  From ascites.    Hypokalemia Replaced.  Check magnesium in am.    RN Pressure Injury Documentation:    Malnutrition Type:  Nutrition Problem: Inadequate oral intake Etiology: inability to eat   Malnutrition Characteristics:  Signs/Symptoms: NPO status   Nutrition Interventions:  Interventions: Ensure Enlive (each supplement provides 350kcal and 20 grams of protein), MVI  Estimated body mass index is 21.76 kg/m as calculated from the following:   Height as of this encounter: 6\' 1"  (1.854 m).   Weight as of this encounter: 74.8 kg.  Code Status: full code.  DVT Prophylaxis:  SCDs Start: 08/17/22 2226   Level of Care: Level of care: Progressive Family Communication none at bedside.  Disposition Plan:     Remains inpatient appropriate:  paracentesis today.   Procedures:  Paracentesis.  Consultants:   GI consult.   Antimicrobials:   Anti-infectives (From admission, onward)    Start     Dose/Rate Route Frequency Ordered Stop   08/18/22 0600  metroNIDAZOLE (FLAGYL) IVPB 500 mg        500 mg 100 mL/hr over 60 Minutes Intravenous  Every 12 hours 08/17/22 1946 08/25/22 0559   08/18/22 0400  ceFEPIme (MAXIPIME) 2 g in sodium chloride 0.9 % 100 mL IVPB        2 g 200 mL/hr over 30 Minutes Intravenous Every 8 hours 08/17/22 2008 08/25/22 0359   08/17/22 1845  ceFEPIme (MAXIPIME) 2 g in sodium chloride 0.9 % 100 mL IVPB  Status:  Discontinued        2 g 200 mL/hr over 30 Minutes Intravenous  Once 08/17/22 1834 08/17/22 1836   08/17/22 1845  metroNIDAZOLE (FLAGYL) IVPB 500 mg        500 mg 100 mL/hr over 60 Minutes Intravenous  Once 08/17/22 1834 08/17/22 2002   08/17/22 1845  ceFEPIme (MAXIPIME) 2 g in sodium chloride 0.9 % 100 mL IVPB  Status:  Discontinued        2 g 200 mL/hr over 30 Minutes Intravenous Every 24 hours 08/17/22 1836 08/17/22 2008         Medications  Scheduled Meds:  carvedilol  3.125 mg Oral BID WC   feeding supplement  237 mL Oral BID BM   folic acid  1 mg Oral Daily   multivitamin with minerals  1 tablet Oral Daily   nicotine  21 mg Transdermal Daily   pantoprazole (PROTONIX) IV  40 mg Intravenous Q12H   thiamine  100 mg Oral Daily   Continuous Infusions:  sodium chloride 150 mL/hr at 08/21/22 0340   ceFEPime (MAXIPIME) IV 2 g (08/21/22 0341)   metronidazole 500 mg (08/21/22 0620)   PRN Meds:.acetaminophen **OR** acetaminophen, albuterol, HYDROmorphone (DILAUDID) injection, labetalol, loperamide, ondansetron **OR** ondansetron (ZOFRAN) IV    Subjective:   Samuel Blevins was seen and examined today.  Reports abd pain , slowly improving.   Objective:   Vitals:   08/21/22 0006 08/21/22 0438 08/21/22 0711 08/21/22 0738  BP: 131/80 124/64 120/64 128/79  Pulse: (!) 119 (!) 113 (!) 112 (!) 107  Resp: 20 15  18   Temp: 98.9 F (37.2 C) 98.9 F (37.2 C) 99.3 F (37.4 C) 98.8 F (37.1 C)  TempSrc: Oral Oral Oral Oral  SpO2: 91% 91% 93% 94%  Weight:  74.8 kg    Height:        Intake/Output Summary (Last 24 hours) at 08/21/2022 0851 Last data filed at 08/21/2022 4098 Gross per 24 hour  Intake 2291.53 ml  Output 1650 ml  Net 641.53 ml   Filed Weights   08/17/22 1615 08/21/22 0438  Weight: 74.8 kg 74.8 kg     Exam General exam: Appears calm and comfortable  Respiratory system: Clear to auscultation. Respiratory effort normal. Cardiovascular system: S1 & S2 heard, RRR. No JVD,  Gastrointestinal system: Abdomen is soft, tender and abdominal distention.  Central nervous system: Alert and oriented. No focal neurological deficits. Extremities: Symmetric 5 x 5 power. Skin: No rashes,  Psychiatry: Mood & affect appropriate.    Data Reviewed:  I have personally reviewed following labs and imaging studies   CBC Lab Results  Component Value Date   WBC 9.7 08/21/2022   RBC 2.91 (L) 08/21/2022    HGB 8.6 (L) 08/21/2022   HCT 26.3 (L) 08/21/2022   MCV 90.4 08/21/2022   MCH 29.6 08/21/2022   PLT 404 (H) 08/21/2022   MCHC 32.7 08/21/2022   RDW 21.5 (H) 08/21/2022   LYMPHSABS 0.6 (L) 08/19/2022   MONOABS 1.4 (H) 08/19/2022   EOSABS 0.1 08/19/2022   BASOSABS 0.0 08/19/2022     Last metabolic  panel Lab Results  Component Value Date   NA 128 (L) 08/21/2022   K 3.4 (L) 08/21/2022   CL 96 (L) 08/21/2022   CO2 22 08/21/2022   BUN 12 08/21/2022   CREATININE 0.92 08/21/2022   GLUCOSE 100 (H) 08/21/2022   GFRNONAA >60 08/21/2022   GFRAA 119 12/28/2019   CALCIUM 7.3 (L) 08/21/2022   PHOS 4.2 08/18/2022   PROT 5.5 (L) 08/21/2022   ALBUMIN 1.9 (L) 08/21/2022   LABGLOB 4.2 02/14/2022   AGRATIO 1.1 (L) 02/14/2022   BILITOT 0.8 08/21/2022   ALKPHOS 77 08/21/2022   AST 66 (H) 08/21/2022   ALT 18 08/21/2022   ANIONGAP 10 08/21/2022    CBG (last 3)  No results for input(s): "GLUCAP" in the last 72 hours.    Coagulation Profile: Recent Labs  Lab 08/17/22 2040 08/20/22 1200  INR 1.1 1.1     Radiology Studies: No results found.     Kathlen Mody M.D. [facility] 08/21/2022, 8:51 AM  Available via Epic secure chat 7am-7pm After 7 pm, please refer to night coverage provider listed on amion.

## 2022-08-21 NOTE — Progress Notes (Signed)
OT Cancellation Note  Patient Details Name: Samuel Blevins MRN: [id number] DOB: 1965/03/10   Cancelled Treatment:    Reason Eval/Treat Not Completed: Patient declined, no reason specified (Educated pt in benefits of OOB and increasing independence, pt closed his eyes and told OT to come back later.)  Evern Bio 08/21/2022, 9:38 AM Berna Spare, OTR/L Acute Rehabilitation Services Office: [phone]

## 2022-08-22 LAB — COMPREHENSIVE METABOLIC PANEL
ALT: 15 U/L (ref 0–44)
AST: 47 U/L — ABNORMAL HIGH (ref 15–41)
Albumin: 2 g/dL — ABNORMAL LOW (ref 3.5–5.0)
Alkaline Phosphatase: 81 U/L (ref 38–126)
Anion gap: 16 — ABNORMAL HIGH (ref 5–15)
BUN: 6 mg/dL (ref 6–20)
CO2: 15 mmol/L — ABNORMAL LOW (ref 22–32)
Calcium: 7.4 mg/dL — ABNORMAL LOW (ref 8.9–10.3)
Chloride: 100 mmol/L (ref 98–111)
Creatinine, Ser: 0.86 mg/dL (ref 0.61–1.24)
GFR, Estimated: >60 mL/min (ref >60)
Glucose, Bld: 67 mg/dL — ABNORMAL LOW (ref 70–99)
Potassium: 3.9 mmol/L (ref 3.5–5.1)
Sodium: 131 mmol/L — ABNORMAL LOW (ref 135–145)
Total Bilirubin: 1.3 mg/dL — ABNORMAL HIGH (ref 0.3–1.2)
Total Protein: 5.8 g/dL — ABNORMAL LOW (ref 6.5–8.1)

## 2022-08-22 LAB — BODY FLUID CULTURE W GRAM STAIN

## 2022-08-22 LAB — LIPASE, BLOOD: Lipase: 145 U/L — ABNORMAL HIGH (ref 11–51)

## 2022-08-22 LAB — MAGNESIUM: Magnesium: 1.5 mg/dL — ABNORMAL LOW (ref 1.7–2.4)

## 2022-08-22 LAB — PHOSPHORUS: Phosphorus: 2.3 mg/dL — ABNORMAL LOW (ref 2.5–4.6)

## 2022-08-22 MED ORDER — LORAZEPAM 1 MG PO TABS: 1.0000 mg | ORAL_TABLET | Freq: Three times a day (TID) | ORAL | Status: DC | PRN

## 2022-08-22 MED ORDER — NICOTINE POLACRILEX 2 MG MT GUM
2.0000 mg | CHEWING_GUM | OROMUCOSAL | Status: DC | PRN
  Filled 2022-08-22: qty 1

## 2022-08-22 MED ORDER — HALOPERIDOL LACTATE 5 MG/ML IJ SOLN: 2.0000 mg | Freq: Four times a day (QID) | INTRAMUSCULAR | Status: DC | PRN

## 2022-08-22 MED ORDER — LORAZEPAM 2 MG/ML IJ SOLN: 1.0000 mg | Freq: Once | INTRAMUSCULAR | Status: DC

## 2022-08-22 NOTE — Progress Notes (Signed)
On call paged re: pt trying to leave AMA out the emergency exit to smoke

## 2022-08-23 LAB — BODY FLUID CULTURE W GRAM STAIN

## 2022-08-23 LAB — ANAEROBIC CULTURE W GRAM STAIN

## 2022-08-23 LAB — CYTOLOGY - NON PAP

## 2022-08-23 NOTE — Discharge Summary (Signed)
Physician Discharge Summary   Patient: Samuel Blevins MRN: [id number] DOB: 03-28-1965  Admit date:     08/17/2022  Discharge date: 08/22/2022  Discharge Physician: Kathlen Mody   PCP: [name], MD   Patient left AMA  Discharge Diagnoses: Active Problems:   Chronic alcohol abuse   Chronic anemia   Chronic pain syndrome   Homeless   Tobacco abuse   Essential hypertension   Seizure (HCC)   Hypokalemia   Hypomagnesemia   Sepsis (HCC)   Pancreatitis   SBP (spontaneous bacterial peritonitis) (HCC)   Hyponatremia   Acute pancreatitis  Resolved Problems:   * No resolved hospital problems. *  Hospital Course:  57 y.o. male with medical history significant of alcohol abuse, tobacco abuse, chronic pancreatitis, chronic pain, hx of seizure d/o, homeless.     Presents with upper abdominal pain for the past 3 days.  Last alcohol intake was this morning.  Today has emesis and abdominal distention   History of alcohol abuse and chronic pancreatitis.  Pt reported nausea and vomiting over the past 2 days.  He reports an episode of bloody bowel movement.  No prior melena no shortness of breath or chest pain    Recently hospitalized for acute pancreatitis. He continues to consume alcohol.   Still smoking and drinking about 1 fifth per day and homeless   He was admitted with acute alcohol induced pancreatitis and has undergone paracentesis now demonstrating SBP. He was started on IV antibiotics,underwent paracentesis. Repeat CT abd and pelvis repeated. ID consulted for antibiotics, but patient left AMA despite counseling.      DISCHARGE MEDICATION: Allergies as of 08/22/2022   No Known Allergies      Medication List     ASK your doctor about these medications    acetaminophen 325 MG tablet Commonly known as: TYLENOL Take 2 tablets (650 mg total) by mouth every 6 (six) hours as needed for fever, moderate pain, mild pain or headache.   amLODipine 5 MG  tablet Commonly known as: NORVASC Take 5 mg by mouth daily.   carvedilol 3.125 MG tablet Commonly known as: COREG Take 1 tablet (3.125 mg total) by mouth 2 (two) times daily with a meal.   Creon 12000-38000 units Cpep capsule Generic drug: lipase/protease/amylase Take 1 capsule (12,000 Units total) by mouth 3 (three) times daily before meals.   folic acid 1 MG tablet Commonly known as: FOLVITE Take 1 tablet (1 mg total) by mouth daily.   hydrOXYzine 25 MG tablet Commonly known as: ATARAX Take 25 mg by mouth at bedtime as needed for anxiety.   Iron (Ferrous Sulfate) 325 (65 Fe) MG Tabs Take 1 tablet ( 325 mg) by mouth daily.   magnesium oxide 400 MG tablet Commonly known as: MAG-OX Take 1 tablet (400 mg total) by mouth in the morning and at bedtime.   multivitamin with minerals Tabs tablet Take 1 tablet by mouth daily.   nicotine 14 mg/24hr patch Commonly known as: NICODERM CQ - dosed in mg/24 hours Place 1 patch (14 mg total) onto the skin daily.   ondansetron 8 MG tablet Commonly known as: ZOFRAN Take 8 mg by mouth every 6 (six) hours as needed for nausea or vomiting.   pantoprazole 40 MG tablet Commonly known as: PROTONIX Take 1 tablet (40 mg total) by mouth 2 (two) times daily before a meal.   polyethylene glycol 17 g packet Commonly known as: MIRALAX / GLYCOLAX Take 17 g by mouth daily as needed  for mild constipation or moderate constipation.   sucralfate 1 g tablet Commonly known as: CARAFATE Take 1 g by mouth See admin instructions. Take 1 tablet by mouth 4 times daily with meals and bedtime   thiamine 100 MG tablet Commonly known as: VITAMIN B1 Take 1 tablet (100 mg total) by mouth daily.   traMADol 50 MG tablet Commonly known as: ULTRAM Take 1 tablet (50 mg total) by mouth every 8 (eight) hours as needed for severe pain.        Discharge Exam: Filed Weights   08/17/22 1615 08/21/22 0438 08/22/22 0351  Weight: 74.8 kg 74.8 kg 77.9 kg        The results of significant diagnostics from this hospitalization (including imaging, microbiology, ancillary and laboratory) are listed below for reference.   Imaging Studies: CT ABDOMEN PELVIS W CONTRAST  Result Date: 08/21/2022 CLINICAL DATA:  Intra-abdominal infection/peritonitis (Ped 0-17y) Peritonitis, not improving after antibiotics; concern for secondary peritonitis and patient with pancreatitis EXAM: CT ABDOMEN AND PELVIS WITH CONTRAST TECHNIQUE: Multidetector CT imaging of the abdomen and pelvis was performed using the standard protocol following bolus administration of intravenous contrast. RADIATION DOSE REDUCTION: This exam was performed according to the departmental dose-optimization program which includes automated exposure control, adjustment of the mA and/or kV according to patient size and/or use of iterative reconstruction technique. CONTRAST:  75mL OMNIPAQUE IOHEXOL 350 MG/ML SOLN COMPARISON:  08/18/2022 FINDINGS: Lower chest: Small bilateral pleural effusions. Bilateral lower lobe airspace opacities are similar to prior study and could reflect atelectasis or infiltrates. Scattered aortic atherosclerosis. Hepatobiliary: Mild low-density throughout the liver compatible with fatty infiltration. No focal abnormality. Gallbladder unremarkable. Pancreas: Few scattered calcifications in the pancreatic head likely within the parenchyma related to prior pancreatitis. No ductal dilatation. No surrounding inflammation. Spleen: No focal abnormality.  Normal size. Adrenals/Urinary Tract: No adrenal abnormality. No focal renal abnormality. No stones or hydronephrosis. Urinary bladder is unremarkable. Stomach/Bowel: Stomach, large and small bowel grossly unremarkable. Vascular/Lymphatic: Aortic atherosclerosis. No evidence of aneurysm or adenopathy. Reproductive: No visible focal abnormality. Other: Moderate free fluid in the abdomen and pelvis. No free air. Findings similar to prior study.  Musculoskeletal: No acute bony abnormality. Degenerative disc and facet disease in the lower lumbar spine. IMPRESSION: Stable moderate free fluid within the abdomen and pelvis. Hepatic steatosis. No current evidence of pancreatitis. Few punctate calcifications in the pancreatic head likely related to chronic pancreatitis. Small bilateral pleural effusions. Bibasilar atelectasis or infiltrates, right greater than left. Electronically Signed   By: Charlett Nose M.D.   On: 08/21/2022 20:48   IR Paracentesis  Result Date: 08/21/2022 INDICATION: Patient with a history of alcohol abuse, chronic pancreatitis admitted with acute pancreatitis and found to have new onset ascites. Interventional radiology asked to perform a diagnostic and therapeutic paracentesis EXAM: ULTRASOUND GUIDED PARACENTESIS MEDICATIONS: 1% lidocaine 10 mL COMPLICATIONS: None immediate. PROCEDURE: Informed written consent was obtained from the patient after a discussion of the risks, benefits and alternatives to treatment. A timeout was performed prior to the initiation of the procedure. Initial ultrasound scanning demonstrates a large amount of ascites within the right lower abdominal quadrant. The right lower abdomen was prepped and draped in the usual sterile fashion. 1% lidocaine was used for local anesthesia. Following this, a 19 gauge, 7-cm, Yueh catheter was introduced. An ultrasound image was saved for documentation purposes. The paracentesis was performed. The catheter was removed and a dressing was applied. The patient tolerated the procedure well without immediate post procedural complication. FINDINGS: A  total of approximately 1 L of bloody fluid was removed. Samples were sent to the laboratory as requested by the clinical team. IMPRESSION: Successful ultrasound-guided paracentesis yielding 1 liters of peritoneal fluid. Procedure performed by Alwyn Ren NP and was supervised by Dr. Deanne Coffer. Electronically Signed   By: Corlis Leak  M.D.   On: 08/21/2022 15:48   CT ABDOMEN PELVIS W CONTRAST  Result Date: 08/18/2022 CLINICAL DATA:  Pancreatitis, acute, severe EXAM: CT ABDOMEN AND PELVIS WITH CONTRAST TECHNIQUE: Multidetector CT imaging of the abdomen and pelvis was performed using the standard protocol following bolus administration of intravenous contrast. RADIATION DOSE REDUCTION: This exam was performed according to the departmental dose-optimization program which includes automated exposure control, adjustment of the mA and/or kV according to patient size and/or use of iterative reconstruction technique. CONTRAST:  75mL OMNIPAQUE IOHEXOL 350 MG/ML SOLN COMPARISON:  Abdomen pelvis 08/17/2022 FINDINGS: Lower chest: Bilateral trace pleural effusions. Bilateral lower lobe atelectasis. Hepatobiliary: No focal liver abnormality. No gallstones, gallbladder wall thickening, or pericholecystic fluid. No biliary dilatation. Pancreas: Redemonstration of poorly defined pancreatic head contour along the second portion of the duodenum. Couple punctate calcific densities along the proximal pancreas likely related to the parenchyma with no definite intraductal calcification. No main pancreatic ductal dilatation. No pseudocyst formation. Spleen: Normal in size without focal abnormality. Adrenals/Urinary Tract: No adrenal nodule bilaterally. Bilateral kidneys enhance symmetrically. No hydronephrosis. No hydroureter. The urinary bladder is unremarkable. On delayed imaging, there is no urothelial wall thickening and there are no filling defects in the opacified portions of the bilateral collecting systems or ureters. Stomach/Bowel: Stomach is within normal limits. Slightly worsened mild thickening and haziness of the second portion of the duodenal wall. No discontinuity of the duodenal wall. No evidence of large bowel wall thickening or dilatation. Appendix appears normal. Vascular/Lymphatic: The main portal, splenic, superior mesenteric veins are patent.  No abdominal aorta or iliac aneurysm. Mild to moderate atherosclerotic plaque of the aorta and its branches. No abdominal, pelvic, or inguinal lymphadenopathy. Reproductive: Prostate is enlarged measuring up to 6 cm. Other: Grossly stable small to moderate volume simple free fluid ascites. No intraperitoneal free gas. No organized fluid collection. Musculoskeletal: No abdominal wall hernia or abnormality. No suspicious lytic or blastic osseous lesions. No acute displaced fracture. IMPRESSION: 1. Poorly defined pancreatic head contour along the second portion of the duodenum likely due to persistent acute pancreatitis. Couple punctate calcific densities along the proximal pancreas likely representing chronic pancreatitis. 2. Slightly worsened mild thickening and haziness of the second portion of the duodenal wall. Finding likely representing reactive changes with developing duodenitis not excluded. 3. Interval development of bilateral trace pleural effusions. 4. Grossly stable small to moderate volume simple free fluid ascites. 5. Prostatomegaly. Electronically Signed   By: Tish Frederickson M.D.   On: 08/18/2022 21:37   US Paracentesis  Result Date: 08/18/2022 INDICATION: Patient with history of ETOH abuse, chronic pancreatitic, seizures admitted with acute pancreatitis and found to have new onset ascites. Request for diagnostic and therapeutic paracentesis. EXAM: ULTRASOUND GUIDED DIAGNOSTIC AND THERAPEUTIC PARACENTESIS MEDICATIONS: 7 mL 1% lidocaine. COMPLICATIONS: None immediate. PROCEDURE: Informed written consent was obtained from the patient after a discussion of the risks, benefits and alternatives to treatment. A timeout was performed prior to the initiation of the procedure. Initial ultrasound scanning demonstrates a large amount of ascites within the right upper abdominal quadrant. The right upper abdomen was prepped and draped in the usual sterile fashion. 1% lidocaine was used for local anesthesia.  Following this,  a 19 gauge, 7-cm, Yueh catheter was introduced. An ultrasound image was saved for documentation purposes. The paracentesis was performed. The catheter was removed and a dressing was applied. The patient tolerated the procedure well without immediate post procedural complication. FINDINGS: A total of approximately 1.1 L of bloody fluid was removed. Samples were sent to the laboratory as requested by the clinical team. IMPRESSION: Successful ultrasound-guided paracentesis yielding 1.1 liters of peritoneal fluid. Performed by Lynnette Caffey, PA-C Electronically Signed   By: Malachy Moan M.D.   On: 08/18/2022 12:28   EEG adult  Result Date: 08/18/2022 Charlsie Quest, MD     08/18/2022  7:37 AM Patient Name: Kamareon Oakley MRN: [id number] Epilepsy Attending: Charlsie Quest Referring Physician/Provider: Therisa Doyne, MD Date: 08/18/2022 Duration: 27.58 mins Patient history: 57yo M with h/o seizures getting eeg to evaluate for seizure. Level of alertness: Awake AEDs during EEG study: None Technical aspects: This EEG study was done with scalp electrodes positioned according to the 10-20 International system of electrode placement. Electrical activity was reviewed with band pass filter of 1-70Hz , sensitivity of 7 uV/mm, display speed of 45mm/sec with a 60Hz  notched filter applied as appropriate. EEG data were recorded continuously and digitally stored.  Video monitoring was available and reviewed as appropriate. Description: No clear posterior dominant rhythm was seen. EEG showed continuous generalized 3 to 6 Hz theta-delta slowing. Photic driving was not seen during photic stimulation. Hyperventilation was not performed.   Of note, eeg was technically difficult due to significant myogenic artifact. ABNORMALITY - Continuous slow, generalized IMPRESSION: This technically difficult study is suggestive of moderate diffuse encephalopathy, nonspecific etiology. No seizures or epileptiform  discharges were seen throughout the recording. Please note lack of epileptiform discharges during interictal EEG does not exclude the diagnosis of epilepsy. Priyanka Annabelle Harman   CT HEAD WO CONTRAST ( )  Result Date: 08/17/2022 CLINICAL DATA:  Head trauma, coagulopathy EXAM: CT HEAD WITHOUT CONTRAST TECHNIQUE: Contiguous axial images were obtained from the base of the skull through the vertex without intravenous contrast. RADIATION DOSE REDUCTION: This exam was performed according to the departmental dose-optimization program which includes automated exposure control, adjustment of the mA and/or kV according to patient size and/or use of iterative reconstruction technique. COMPARISON:  06/06/2022 FINDINGS: Brain: There is no mass, hemorrhage or extra-axial collection. The size and configuration of the ventricles and extra-axial CSF spaces are normal. The brain parenchyma is normal, without acute or chronic infarction. Vascular: No abnormal hyperdensity of the major intracranial arteries or dural venous sinuses. No intracranial atherosclerosis. Skull: The visualized skull base, calvarium and extracranial soft tissues are normal. Sinuses/Orbits: No fluid levels or advanced mucosal thickening of the visualized paranasal sinuses. No mastoid or middle ear effusion. The orbits are normal. IMPRESSION: Normal head CT. Electronically Signed   By: Deatra Robinson M.D.   On: 08/17/2022 22:52   DG CHEST PORT 1 VIEW  Result Date: 08/17/2022 CLINICAL DATA:  Upper abdominal pain. EXAM: PORTABLE CHEST 1 VIEW COMPARISON:  Jul 11, 2022 FINDINGS: The heart size and mediastinal contours are within normal limits. Mild, stable atelectatic changes are seen within the bilateral lung bases. There is no evidence of focal consolidation, pleural effusion or pneumothorax. The visualized skeletal structures are unremarkable. IMPRESSION: Mild, stable bibasilar atelectasis. Electronically Signed   By: Aram Candela M.D.   On: 08/17/2022  20:13   CT ABDOMEN PELVIS W CONTRAST  Result Date: 08/17/2022 CLINICAL DATA:  Three day history of upper abdominal pain associated with emesis  and abdominal distention EXAM: CT ABDOMEN AND PELVIS WITH CONTRAST TECHNIQUE: Multidetector CT imaging of the abdomen and pelvis was performed using the standard protocol following bolus administration of intravenous contrast. RADIATION DOSE REDUCTION: This exam was performed according to the departmental dose-optimization program which includes automated exposure control, adjustment of the mA and/or kV according to patient size and/or use of iterative reconstruction technique. CONTRAST:  75mL OMNIPAQUE IOHEXOL 350 MG/ML SOLN COMPARISON:  CT abdomen and pelvis dated 07/11/2022 FINDINGS: Lower chest: No focal consolidation or pulmonary nodule in the lung bases. No pleural effusion or pneumothorax demonstrated. Partially imaged heart size is normal. Hepatobiliary: Diffuse parenchymal hypoattenuation can be seen with hepatic steatosis. No intra or extrahepatic biliary ductal dilation. Normal gallbladder. Pancreas: Decreased mild peripancreatic stranding surrounding the head and uncinate process. No focal lesions or main ductal dilation. Spleen: Normal in size without focal abnormality. Adrenals/Urinary Tract: No adrenal nodules. No suspicious renal mass, calculi or hydronephrosis. No focal bladder wall thickening. Stomach/Bowel: Normal appearance of the stomach. No evidence of bowel wall thickening, distention, or inflammatory changes. Normal appendix. Vascular/Lymphatic: Aortic atherosclerosis. No enlarged abdominal or pelvic lymph nodes. Reproductive: Enlargement of the prostate with median lobe hypertrophy. Other: New moderate volume ascites. No free air or fluid collection. Musculoskeletal: No acute or abnormal lytic or blastic osseous lesions. Postsurgical changes of the paraumbilical region. Degenerative changes of the lumbar spine, most pronounced at L5-S1.  IMPRESSION: 1. New moderate volume ascites. 2. Decreased mild peripancreatic stranding surrounding the head and uncinate process, which may represent improving pancreatitis. 3. Hepatic steatosis. 4. Enlargement of the prostate with median lobe hypertrophy. 5.  Aortic Atherosclerosis (ICD10-I70.0). Electronically Signed   By: Agustin Cree M.D.   On: 08/17/2022 18:55    Microbiology: Results for orders placed or performed during the hospital encounter of 08/17/22  Culture, blood (routine x 2)     Status: None   Collection Time: 08/17/22  5:17 PM   Specimen: BLOOD RIGHT ARM  Result Value Ref Range Status   Specimen Description BLOOD RIGHT ARM  Final   Special Requests   Final    BOTTLES DRAWN AEROBIC AND ANAEROBIC Blood Culture adequate volume   Culture   Final    NO GROWTH 5 DAYS Performed at [facility], [address] [place], [place] [address]    Report Status 08/22/2022 FINAL  Final  Culture, blood (routine x 2)     Status: None   Collection Time: 08/17/22  5:56 PM   Specimen: BLOOD LEFT ARM  Result Value Ref Range Status   Specimen Description BLOOD LEFT ARM  Final   Special Requests   Final    BOTTLES DRAWN AEROBIC AND ANAEROBIC Blood Culture results may not be optimal due to an excessive volume of blood received in culture bottles   Culture   Final    NO GROWTH 5 DAYS Performed at [facility], [address], [place], [place] [address]    Report Status 08/22/2022 FINAL  Final  Urine Culture     Status: Abnormal   Collection Time: 08/17/22  6:45 PM   Specimen: Urine, Random  Result Value Ref Range Status   Specimen Description URINE, RANDOM  Final   Special Requests   Final    URINE, CLEAN CATCH Performed at [facility], [address] [place], [place] [address]    Culture 90,000 COLONIES/mL CITROBACTER KOSERI (A)  Final   Report Status 08/19/2022 FINAL  Final   Organism ID,  Bacteria CITROBACTER KOSERI (A)  Final      Susceptibility   Citrobacter  koseri - MIC*    CEFEPIME <=0.12 SENSITIVE Sensitive     CEFTRIAXONE <=0.25 SENSITIVE Sensitive     CIPROFLOXACIN <=0.25 SENSITIVE Sensitive     GENTAMICIN <=1 SENSITIVE Sensitive     IMIPENEM <=0.25 SENSITIVE Sensitive     NITROFURANTOIN <=16 SENSITIVE Sensitive     TRIMETH/SULFA <=20 SENSITIVE Sensitive     PIP/TAZO <=4 SENSITIVE Sensitive     * 90,000 COLONIES/mL CITROBACTER KOSERI  Body fluid culture w Gram Stain     Status: None   Collection Time: 08/18/22 12:00 PM   Specimen: PATH Cytology Peritoneal fluid  Result Value Ref Range Status   Specimen Description PERITONEAL  Final   Special Requests NONE  Final   Gram Stain   Final    RARE WBC PRESENT, PREDOMINANTLY PMN NO ORGANISMS SEEN    Culture   Final    NO GROWTH 3 DAYS Performed at [facility], [address], [place], [place] [address]    Report Status 08/21/2022 FINAL  Final  Anaerobic culture w Gram Stain     Status: None (Preliminary result)   Collection Time: 08/18/22 12:00 PM   Specimen: Peritoneal Washings  Result Value Ref Range Status   Specimen Description PERITONEAL  Final   Special Requests NONE  Final   Gram Stain   Final    RARE WBC PRESENT, PREDOMINANTLY PMN NO ORGANISMS SEEN Performed at [facility], [address], [place], [place] [address]    Culture   Final    NO ANAEROBES ISOLATED; CULTURE IN PROGRESS FOR 5 DAYS   Report Status PENDING  Incomplete  Body fluid culture w Gram Stain     Status: None (Preliminary result)   Collection Time: 08/21/22  2:53 PM   Specimen: PATH Cytology Peritoneal fluid  Result Value Ref Range Status   Specimen Description PERITONEAL  Final   Special Requests NONE  Final   Gram Stain   Final    FEW WBC PRESENT, PREDOMINANTLY PMN NO ORGANISMS SEEN    Culture   Final    NO GROWTH < 24 HOURS Performed at [facility], [address], [place], [place] [address]    Report Status PENDING  Incomplete    Labs: CBC: Recent Labs  Lab  08/17/22 1623 08/17/22 1746 08/17/22 2040 08/18/22 0245 08/18/22 1824 08/19/22 0033 08/20/22 0026 08/21/22 0051  WBC 15.6*  --  14.2* 18.3*  --  17.6* 13.1* 9.7  NEUTROABS 14.2*  --   --  15.9*  --  15.3*  --   --   HGB 13.1   < > 11.3* 10.9* 10.1* 9.5* 9.0* 8.6*  HCT 37.9*   < > 34.0* 31.3* 29.6* 28.0* 26.5* 26.3*  MCV 86.9  --  88.1 85.5  --  88.6 86.9 90.4  PLT 589*  --  469* 497*  --  433* 424* 404*   < > = values in this interval not displayed.   Basic Metabolic Panel: Recent Labs  Lab 08/17/22 2040 08/18/22 0245 08/19/22 0033 08/20/22 0026 08/21/22 0051 08/22/22 0059  NA 126* 125* 126* 125* 128* 131*  K 3.0* 3.2* 3.2* 3.5 3.4* 3.9  CL 84* 85* 88* 91* 96* 100  CO2 21* 21* 22 20* 22 15*  GLUCOSE 89 95 87 84 100* 67*  BUN 21* 23* 22* 18 12 6   CREATININE 1.00 1.24 0.97 1.04 0.92  0.86  CALCIUM 7.3* 7.3* 7.2* 7.3* 7.3* 7.4*  MG 1.2* 1.9 1.6* 2.0 1.8 1.5*  PHOS 4.3 4.2  --   --   --  2.3*   Liver Function Tests: Recent Labs  Lab 08/18/22 0245 08/19/22 0033 08/20/22 0026 08/21/22 0051 08/22/22 0059  AST 30 27 51* 66* 47*  ALT 18 14 17 18 15   ALKPHOS 85 75 79 77 81  BILITOT 2.1* 1.3* 1.4* 0.8 1.3*  PROT 6.5 5.7* 5.8* 5.5* 5.8*  ALBUMIN 2.5* 2.0* 2.1* 1.9* 2.0*   CBG: Recent Labs  Lab 08/17/22 2150  GLUCAP 108*    Patient left AMA.   Signed: Kathlen Mody, MD [facility] 08/23/2022

## 2022-08-24 ENCOUNTER — Encounter: Payer: Self-pay | Admitting: Critical Care Medicine

## 2022-08-24 LAB — BODY FLUID CULTURE W GRAM STAIN: Culture: NO GROWTH

## 2022-08-24 NOTE — Progress Notes (Signed)
Entered in error 

## 2022-08-26 ENCOUNTER — Emergency Department (HOSPITAL_COMMUNITY): Payer: MEDICAID

## 2022-08-26 ENCOUNTER — Inpatient Hospital Stay (HOSPITAL_COMMUNITY)
Admission: EM | Admit: 2022-08-26 | Discharge: 2022-09-18 | DRG: 871 | Discharge status: Against Medical Advice | Payer: MEDICAID | Attending: Internal Medicine | Admitting: Internal Medicine

## 2022-08-26 ENCOUNTER — Telehealth: Payer: Self-pay

## 2022-08-26 ENCOUNTER — Other Ambulatory Visit: Payer: Self-pay

## 2022-08-26 ENCOUNTER — Encounter (HOSPITAL_COMMUNITY): Payer: Self-pay

## 2022-08-26 DIAGNOSIS — Z79899 Other long term (current) drug therapy: Secondary | ICD-10-CM

## 2022-08-26 DIAGNOSIS — G894 Chronic pain syndrome: Secondary | ICD-10-CM | POA: Diagnosis present

## 2022-08-26 DIAGNOSIS — B999 Unspecified infectious disease: Secondary | ICD-10-CM

## 2022-08-26 DIAGNOSIS — F1020 Alcohol dependence, uncomplicated: Secondary | ICD-10-CM | POA: Diagnosis present

## 2022-08-26 DIAGNOSIS — A4181 Sepsis due to Enterococcus: Principal | ICD-10-CM | POA: Diagnosis present

## 2022-08-26 DIAGNOSIS — N179 Acute kidney failure, unspecified: Secondary | ICD-10-CM | POA: Diagnosis present

## 2022-08-26 DIAGNOSIS — K5780 Diverticulitis of intestine, part unspecified, with perforation and abscess without bleeding: Secondary | ICD-10-CM | POA: Diagnosis present

## 2022-08-26 DIAGNOSIS — K659 Peritonitis, unspecified: Secondary | ICD-10-CM

## 2022-08-26 DIAGNOSIS — K860 Alcohol-induced chronic pancreatitis: Secondary | ICD-10-CM | POA: Diagnosis present

## 2022-08-26 DIAGNOSIS — K651 Peritoneal abscess: Secondary | ICD-10-CM | POA: Diagnosis not present

## 2022-08-26 DIAGNOSIS — Z5982 Transportation insecurity: Secondary | ICD-10-CM

## 2022-08-26 DIAGNOSIS — K861 Other chronic pancreatitis: Secondary | ICD-10-CM

## 2022-08-26 DIAGNOSIS — Z1621 Resistance to vancomycin: Secondary | ICD-10-CM | POA: Diagnosis present

## 2022-08-26 DIAGNOSIS — J9601 Acute respiratory failure with hypoxia: Secondary | ICD-10-CM | POA: Diagnosis not present

## 2022-08-26 DIAGNOSIS — K760 Fatty (change of) liver, not elsewhere classified: Secondary | ICD-10-CM | POA: Diagnosis present

## 2022-08-26 DIAGNOSIS — R6510 Systemic inflammatory response syndrome (SIRS) of non-infectious origin without acute organ dysfunction: Secondary | ICD-10-CM

## 2022-08-26 DIAGNOSIS — A419 Sepsis, unspecified organism: Secondary | ICD-10-CM | POA: Diagnosis present

## 2022-08-26 DIAGNOSIS — E8809 Other disorders of plasma-protein metabolism, not elsewhere classified: Secondary | ICD-10-CM | POA: Diagnosis present

## 2022-08-26 DIAGNOSIS — K709 Alcoholic liver disease, unspecified: Secondary | ICD-10-CM

## 2022-08-26 DIAGNOSIS — A0472 Enterocolitis due to Clostridium difficile, not specified as recurrent: Secondary | ICD-10-CM | POA: Diagnosis present

## 2022-08-26 DIAGNOSIS — E861 Hypovolemia: Secondary | ICD-10-CM | POA: Diagnosis present

## 2022-08-26 DIAGNOSIS — D638 Anemia in other chronic diseases classified elsewhere: Secondary | ICD-10-CM | POA: Diagnosis present

## 2022-08-26 DIAGNOSIS — R601 Generalized edema: Secondary | ICD-10-CM

## 2022-08-26 DIAGNOSIS — D75839 Thrombocytosis, unspecified: Secondary | ICD-10-CM | POA: Diagnosis present

## 2022-08-26 DIAGNOSIS — F17210 Nicotine dependence, cigarettes, uncomplicated: Secondary | ICD-10-CM | POA: Diagnosis present

## 2022-08-26 DIAGNOSIS — R188 Other ascites: Secondary | ICD-10-CM

## 2022-08-26 DIAGNOSIS — K863 Pseudocyst of pancreas: Secondary | ICD-10-CM | POA: Diagnosis present

## 2022-08-26 DIAGNOSIS — R64 Cachexia: Secondary | ICD-10-CM | POA: Diagnosis present

## 2022-08-26 DIAGNOSIS — Z91199 Patient's noncompliance with other medical treatment and regimen due to unspecified reason: Secondary | ICD-10-CM

## 2022-08-26 DIAGNOSIS — Z5329 Procedure and treatment not carried out because of patient's decision for other reasons: Secondary | ICD-10-CM | POA: Diagnosis present

## 2022-08-26 DIAGNOSIS — I10 Essential (primary) hypertension: Secondary | ICD-10-CM | POA: Diagnosis present

## 2022-08-26 DIAGNOSIS — E43 Unspecified severe protein-calorie malnutrition: Secondary | ICD-10-CM | POA: Diagnosis present

## 2022-08-26 DIAGNOSIS — K5792 Diverticulitis of intestine, part unspecified, without perforation or abscess without bleeding: Secondary | ICD-10-CM | POA: Diagnosis present

## 2022-08-26 DIAGNOSIS — K652 Spontaneous bacterial peritonitis: Secondary | ICD-10-CM | POA: Diagnosis not present

## 2022-08-26 DIAGNOSIS — K852 Alcohol induced acute pancreatitis without necrosis or infection: Secondary | ICD-10-CM | POA: Diagnosis present

## 2022-08-26 DIAGNOSIS — K2980 Duodenitis without bleeding: Secondary | ICD-10-CM | POA: Diagnosis present

## 2022-08-26 DIAGNOSIS — K7031 Alcoholic cirrhosis of liver with ascites: Secondary | ICD-10-CM | POA: Diagnosis present

## 2022-08-26 DIAGNOSIS — K219 Gastro-esophageal reflux disease without esophagitis: Secondary | ICD-10-CM | POA: Diagnosis present

## 2022-08-26 DIAGNOSIS — Z5941 Food insecurity: Secondary | ICD-10-CM

## 2022-08-26 DIAGNOSIS — Z1152 Encounter for screening for COVID-19: Secondary | ICD-10-CM

## 2022-08-26 DIAGNOSIS — E876 Hypokalemia: Secondary | ICD-10-CM | POA: Diagnosis present

## 2022-08-26 DIAGNOSIS — G928 Other toxic encephalopathy: Secondary | ICD-10-CM | POA: Diagnosis present

## 2022-08-26 DIAGNOSIS — Z5900 Homelessness unspecified: Secondary | ICD-10-CM

## 2022-08-26 DIAGNOSIS — D75838 Other thrombocytosis: Secondary | ICD-10-CM | POA: Diagnosis not present

## 2022-08-26 DIAGNOSIS — E875 Hyperkalemia: Secondary | ICD-10-CM | POA: Diagnosis not present

## 2022-08-26 DIAGNOSIS — E871 Hypo-osmolality and hyponatremia: Secondary | ICD-10-CM | POA: Diagnosis present

## 2022-08-26 DIAGNOSIS — K859 Acute pancreatitis without necrosis or infection, unspecified: Principal | ICD-10-CM

## 2022-08-26 DIAGNOSIS — K8689 Other specified diseases of pancreas: Secondary | ICD-10-CM | POA: Insufficient documentation

## 2022-08-26 DIAGNOSIS — E785 Hyperlipidemia, unspecified: Secondary | ICD-10-CM | POA: Diagnosis present

## 2022-08-26 HISTORY — PX: IR PARACENTESIS: IMG2679

## 2022-08-26 LAB — GRAM STAIN

## 2022-08-26 LAB — CBC WITH DIFFERENTIAL/PLATELET
Abs Immature Granulocytes: 0.49 10*3/uL — ABNORMAL HIGH (ref 0.00–0.07)
Basophils Absolute: 0.1 10*3/uL (ref 0.0–0.1)
Basophils Relative: 1 %
Eosinophils Absolute: 0.1 10*3/uL (ref 0.0–0.5)
Eosinophils Relative: 1 %
HCT: 36.9 % — ABNORMAL LOW (ref 39.0–52.0)
Hemoglobin: 11.6 g/dL — ABNORMAL LOW (ref 13.0–17.0)
Immature Granulocytes: 3 %
Lymphocytes Relative: 7 %
Lymphs Abs: 1.3 10*3/uL (ref 0.7–4.0)
MCH: 28.2 pg (ref 26.0–34.0)
MCHC: 31.4 g/dL (ref 30.0–36.0)
MCV: 89.6 fL (ref 80.0–100.0)
Monocytes Absolute: 1.5 10*3/uL — ABNORMAL HIGH (ref 0.1–1.0)
Monocytes Relative: 9 %
Neutro Abs: 13.6 10*3/uL — ABNORMAL HIGH (ref 1.7–7.7)
Neutrophils Relative %: 79 %
Platelets: 558 10*3/uL — ABNORMAL HIGH (ref 150–400)
RBC: 4.12 MIL/uL — ABNORMAL LOW (ref 4.22–5.81)
RDW: 22.3 % — ABNORMAL HIGH (ref 11.5–15.5)
Smear Review: INCREASED
WBC: 17.1 10*3/uL — ABNORMAL HIGH (ref 4.0–10.5)
nRBC: 0 % (ref 0.0–0.2)

## 2022-08-26 LAB — ALBUMIN, PLEURAL OR PERITONEAL FLUID: Albumin, Fluid: 1.5 g/dL

## 2022-08-26 LAB — BODY FLUID CELL COUNT WITH DIFFERENTIAL
Eos, Fluid: 0 %
Lymphs, Fluid: 1 %
Monocyte-Macrophage-Serous Fluid: 22 % — ABNORMAL LOW (ref 50–90)
Neutrophil Count, Fluid: 77 % — ABNORMAL HIGH (ref 0–25)
Total Nucleated Cell Count, Fluid: 3592 cu mm — ABNORMAL HIGH (ref 0–1000)

## 2022-08-26 LAB — COMPREHENSIVE METABOLIC PANEL
ALT: 13 U/L (ref 0–44)
AST: 30 U/L (ref 15–41)
Albumin: 2 g/dL — ABNORMAL LOW (ref 3.5–5.0)
Alkaline Phosphatase: 69 U/L (ref 38–126)
Anion gap: 18 — ABNORMAL HIGH (ref 5–15)
BUN: 8 mg/dL (ref 6–20)
CO2: 16 mmol/L — ABNORMAL LOW (ref 22–32)
Calcium: 7.6 mg/dL — ABNORMAL LOW (ref 8.9–10.3)
Chloride: 98 mmol/L (ref 98–111)
Creatinine, Ser: 1.11 mg/dL (ref 0.61–1.24)
GFR, Estimated: >60 mL/min (ref >60)
Glucose, Bld: 103 mg/dL — ABNORMAL HIGH (ref 70–99)
Potassium: 3.2 mmol/L — ABNORMAL LOW (ref 3.5–5.1)
Sodium: 132 mmol/L — ABNORMAL LOW (ref 135–145)
Total Bilirubin: 0.8 mg/dL (ref 0.3–1.2)
Total Protein: 6.5 g/dL (ref 6.5–8.1)

## 2022-08-26 LAB — RESP PANEL BY RT-PCR (RSV, FLU A&B, COVID)  RVPGX2
Influenza A by PCR: NEGATIVE
Influenza B by PCR: NEGATIVE
Resp Syncytial Virus by PCR: NEGATIVE
SARS Coronavirus 2 by RT PCR: NEGATIVE

## 2022-08-26 LAB — LACTIC ACID, PLASMA
Lactic Acid, Venous: 2.1 mmol/L (ref 0.5–1.9)
Lactic Acid, Venous: 1.4 mmol/L (ref 0.5–1.9)

## 2022-08-26 LAB — PROTIME-INR
INR: 1.2 (ref 0.8–1.2)
Prothrombin Time: 15.3 seconds — ABNORMAL HIGH (ref 11.4–15.2)

## 2022-08-26 LAB — LIPASE, BLOOD: Lipase: 445 U/L — ABNORMAL HIGH (ref 11–51)

## 2022-08-26 LAB — GLUCOSE, PLEURAL OR PERITONEAL FLUID: Glucose, Fluid: 89 mg/dL

## 2022-08-26 LAB — MAGNESIUM: Magnesium: 1.1 mg/dL — ABNORMAL LOW (ref 1.7–2.4)

## 2022-08-26 LAB — APTT: aPTT: 34 seconds (ref 24–36)

## 2022-08-26 MED ORDER — IOHEXOL 350 MG/ML SOLN
75.0000 mL | Freq: Once | INTRAVENOUS | Status: AC | PRN
  Administered 2022-08-26: 75 mL via INTRAVENOUS

## 2022-08-26 MED ORDER — THIAMINE MONONITRATE 100 MG PO TABS
100.0000 mg | ORAL_TABLET | Freq: Every day | ORAL | Status: DC
  Administered 2022-08-26 – 2022-09-18 (×24): 100 mg via ORAL
  Filled 2022-08-26 (×24): qty 1

## 2022-08-26 MED ORDER — SUCRALFATE 1 G PO TABS
1.0000 g | ORAL_TABLET | Freq: Three times a day (TID) | ORAL | Status: DC
  Administered 2022-08-26 – 2022-09-08 (×51): 1 g via ORAL
  Filled 2022-08-26 (×54): qty 1

## 2022-08-26 MED ORDER — ADULT MULTIVITAMIN W/MINERALS CH
1.0000 | ORAL_TABLET | Freq: Every day | ORAL | Status: DC
  Administered 2022-08-27 – 2022-09-18 (×23): 1 via ORAL
  Filled 2022-08-26 (×24): qty 1

## 2022-08-26 MED ORDER — LORAZEPAM 2 MG/ML IJ SOLN
1.0000 mg | Freq: Once | INTRAMUSCULAR | Status: AC
  Administered 2022-08-26: 1 mg via INTRAVENOUS
  Filled 2022-08-26 (×2): qty 1

## 2022-08-26 MED ORDER — LIDOCAINE HCL 1 % IJ SOLN
10.0000 mL | Freq: Once | INTRAMUSCULAR | Status: DC
  Filled 2022-08-26: qty 10

## 2022-08-26 MED ORDER — CARVEDILOL 3.125 MG PO TABS
3.1250 mg | ORAL_TABLET | Freq: Two times a day (BID) | ORAL | Status: DC
  Administered 2022-08-26 – 2022-08-28 (×4): 3.125 mg via ORAL
  Filled 2022-08-26 (×4): qty 1

## 2022-08-26 MED ORDER — ENOXAPARIN SODIUM 40 MG/0.4ML IJ SOSY
40.0000 mg | PREFILLED_SYRINGE | INTRAMUSCULAR | Status: DC
  Administered 2022-08-26 – 2022-09-06 (×12): 40 mg via SUBCUTANEOUS
  Filled 2022-08-26 (×11): qty 0.4

## 2022-08-26 MED ORDER — ONDANSETRON HCL 4 MG/2ML IJ SOLN
4.0000 mg | Freq: Once | INTRAMUSCULAR | Status: AC
  Administered 2022-08-26: 4 mg via INTRAVENOUS
  Filled 2022-08-26: qty 2

## 2022-08-26 MED ORDER — KETOROLAC TROMETHAMINE 30 MG/ML IJ SOLN
30.0000 mg | Freq: Four times a day (QID) | INTRAMUSCULAR | Status: DC | PRN
  Administered 2022-08-27 (×2): 30 mg via INTRAVENOUS
  Filled 2022-08-26 (×3): qty 1

## 2022-08-26 MED ORDER — THIAMINE HCL 100 MG/ML IJ SOLN: 100.0000 mg | Freq: Every day | INTRAMUSCULAR | Status: DC

## 2022-08-26 MED ORDER — FOLIC ACID 1 MG PO TABS
1.0000 mg | ORAL_TABLET | Freq: Every day | ORAL | Status: DC
  Administered 2022-08-26 – 2022-09-18 (×24): 1 mg via ORAL
  Filled 2022-08-26 (×24): qty 1

## 2022-08-26 MED ORDER — HYDROMORPHONE HCL 1 MG/ML IJ SOLN
1.0000 mg | Freq: Once | INTRAMUSCULAR | Status: AC
  Administered 2022-08-26: 1 mg via INTRAVENOUS
  Filled 2022-08-26: qty 1

## 2022-08-26 MED ORDER — SODIUM CHLORIDE 0.9 % IV SOLN
2.0000 g | Freq: Once | INTRAVENOUS | Status: AC
  Administered 2022-08-26: 2 g via INTRAVENOUS
  Filled 2022-08-26: qty 12.5

## 2022-08-26 MED ORDER — PANTOPRAZOLE SODIUM 40 MG PO TBEC
40.0000 mg | DELAYED_RELEASE_TABLET | Freq: Two times a day (BID) | ORAL | Status: DC
  Administered 2022-08-26 – 2022-09-18 (×45): 40 mg via ORAL
  Filled 2022-08-26 (×45): qty 1

## 2022-08-26 MED ORDER — LIDOCAINE HCL (PF) 1 % IJ SOLN: 10.0000 mL | Freq: Once | INTRAMUSCULAR | Status: DC

## 2022-08-26 MED ORDER — LORAZEPAM 2 MG/ML IJ SOLN: 1.0000 mg | INTRAMUSCULAR | Status: DC | PRN

## 2022-08-26 MED ORDER — ADULT MULTIVITAMIN W/MINERALS CH
1.0000 | ORAL_TABLET | Freq: Every day | ORAL | Status: DC
  Administered 2022-08-26: 1 via ORAL
  Filled 2022-08-26: qty 1

## 2022-08-26 MED ORDER — LORAZEPAM 1 MG PO TABS
1.0000 mg | ORAL_TABLET | ORAL | Status: DC | PRN
  Administered 2022-08-28: 2 mg via ORAL
  Filled 2022-08-26: qty 2

## 2022-08-26 MED ORDER — POTASSIUM CHLORIDE CRYS ER 20 MEQ PO TBCR
40.0000 meq | EXTENDED_RELEASE_TABLET | Freq: Once | ORAL | Status: AC
  Administered 2022-08-26: 40 meq via ORAL
  Filled 2022-08-26: qty 2

## 2022-08-26 MED ORDER — LACTATED RINGERS IV BOLUS
1000.0000 mL | Freq: Once | INTRAVENOUS | Status: AC
  Administered 2022-08-26: 1000 mL via INTRAVENOUS

## 2022-08-26 MED ORDER — THIAMINE MONONITRATE 100 MG PO TABS
100.0000 mg | ORAL_TABLET | Freq: Every day | ORAL | Status: DC
  Administered 2022-08-26: 100 mg via ORAL
  Filled 2022-08-26: qty 1

## 2022-08-26 MED ORDER — LACTATED RINGERS IV SOLN: INTRAVENOUS | Status: DC

## 2022-08-26 MED ORDER — RISAQUAD PO CAPS
2.0000 | ORAL_CAPSULE | Freq: Three times a day (TID) | ORAL | Status: DC
  Administered 2022-08-26 – 2022-09-18 (×67): 2 via ORAL
  Filled 2022-08-26 (×68): qty 2

## 2022-08-26 MED ORDER — METRONIDAZOLE 500 MG/100ML IV SOLN
500.0000 mg | Freq: Once | INTRAVENOUS | Status: AC
  Administered 2022-08-26: 500 mg via INTRAVENOUS
  Filled 2022-08-26: qty 100

## 2022-08-26 MED ORDER — MAGNESIUM SULFATE 2 GM/50ML IV SOLN
2.0000 g | Freq: Once | INTRAVENOUS | Status: AC
  Administered 2022-08-26: 2 g via INTRAVENOUS
  Filled 2022-08-26: qty 50

## 2022-08-26 MED ORDER — ONDANSETRON HCL 4 MG PO TABS: 8.0000 mg | ORAL_TABLET | Freq: Four times a day (QID) | ORAL | Status: DC | PRN

## 2022-08-26 MED ORDER — FOLIC ACID 1 MG PO TABS
1.0000 mg | ORAL_TABLET | Freq: Every day | ORAL | Status: DC
  Administered 2022-08-26: 1 mg via ORAL
  Filled 2022-08-26: qty 1

## 2022-08-26 MED ORDER — HYDROXYZINE HCL 25 MG PO TABS
25.0000 mg | ORAL_TABLET | Freq: Every evening | ORAL | Status: DC | PRN
  Administered 2022-08-30 – 2022-09-17 (×4): 25 mg via ORAL
  Filled 2022-08-26 (×5): qty 1

## 2022-08-26 MED ORDER — PANCRELIPASE (LIP-PROT-AMYL) 12000-38000 UNITS PO CPEP
12000.0000 [IU] | ORAL_CAPSULE | Freq: Three times a day (TID) | ORAL | Status: DC
  Administered 2022-08-26 – 2022-09-18 (×63): 12000 [IU] via ORAL
  Filled 2022-08-26 (×66): qty 1

## 2022-08-26 MED ORDER — SODIUM CHLORIDE 0.9 % IV SOLN
2.0000 g | Freq: Three times a day (TID) | INTRAVENOUS | Status: AC
  Administered 2022-08-26 – 2022-09-02 (×21): 2 g via INTRAVENOUS
  Filled 2022-08-26 (×21): qty 12.5

## 2022-08-26 MED ORDER — LIDOCAINE HCL 1 % IJ SOLN
INTRAMUSCULAR | Status: AC
  Filled 2022-08-26: qty 20

## 2022-08-26 NOTE — Transitions of Care (Post Inpatient/ED Visit) (Signed)
   08/26/2022  Name: Samuel Blevins MRN: [id number] DOB: 23-Feb-1966  Today's TOC FU Call Status:    Attempted to reach the patient regarding the most recent Inpatient/ED visit.  Follow Up Plan: No further outreach attempts will be made at this time. We have been unable to contact the patient.- patient is currently in the ED  Signature Robyne Peers, RN

## 2022-08-26 NOTE — Progress Notes (Signed)
Elink following for sepsis protocol. 

## 2022-08-26 NOTE — ED Notes (Addendum)
ED TO INPATIENT HANDOFF REPORT  ED Nurse Name and Phone #: [phone]  S Name/Age/Gender Samuel Blevins 57 y.o. male Room/Bed: 027C/027C  Code Status   Code Status: Full Code  Home/SNF/Other Home Patient oriented to: self, place, time, and situation Is this baseline? Yes   Triage Complete: Triage complete  Chief Complaint Pancreatitis [K85.90]  Triage Note Pt to ED via ems from motel with c/o generalized abd pain since wednesday. Pt reports it started once he started back drinking alcohol last week. Rpt hx of pancreatitis. Pt Aox4 min triage, 10/10 abd pain denies n/v. Pt reports pain is norse with abd palpation. Stats last drink was last night.   Allergies No Known Allergies  Level of Care/Admitting Diagnosis ED Disposition   ED Disposition: Admit Condition: None Comment: Hospital Area: [facility] [[id number]]  Level of Care: Telemetry Medical [104]  May place patient in observation at [facility] or [facility] if equivalent level of care is available:: No  Covid Evaluation: Asymptomatic - no recent exposure (last 10 days) testing not required  Diagnosis: Pancreatitis [161096]  Admitting Physician: Emeline General [[id number]]  Attending Physician: Emeline General [[id number]]      B Medical/Surgery History Past Medical History:  Diagnosis Date   Alcohol withdrawal syndrome with complication (HCC)    Alcoholism (HCC)    Elevated AST (SGOT)    Gastrointestinal hemorrhage    Homeless    Hypertension    Pancreatic insufficiency    takes Creon   Symptomatic anemia 11/23/2015   Thrombocytopenia (HCC) 05/09/2021   Past Surgical History:  Procedure Laterality Date   BIOPSY  06/23/2019   Procedure: BIOPSY;  Surgeon: Shellia Cleverly, DO;  Location: [facility] ENDOSCOPY;  Service: Gastroenterology;;   BIOPSY  12/12/2020   Procedure: BIOPSY;  Surgeon: Lynann Bologna, MD;  Location: [facility] ENDOSCOPY;  Service: Endoscopy;;   BIOPSY  08/04/2021   Procedure: BIOPSY;   Surgeon: Jenel Lucks, MD;  Location: [facility] ENDOSCOPY;  Service: Gastroenterology;;   ESOPHAGOGASTRODUODENOSCOPY N/A 08/04/2021   Procedure: ESOPHAGOGASTRODUODENOSCOPY (EGD);  Surgeon: Jenel Lucks, MD;  Location: [facility] ENDOSCOPY;  Service: Gastroenterology;  Laterality: N/A;   ESOPHAGOGASTRODUODENOSCOPY (EGD) WITH PROPOFOL N/A 06/23/2019   Procedure: ESOPHAGOGASTRODUODENOSCOPY (EGD) WITH PROPOFOL;  Surgeon: Shellia Cleverly, DO;  Location: [facility] ENDOSCOPY;  Service: Gastroenterology;  Laterality: N/A;   ESOPHAGOGASTRODUODENOSCOPY (EGD) WITH PROPOFOL N/A 12/12/2020   Procedure: ESOPHAGOGASTRODUODENOSCOPY (EGD) WITH PROPOFOL;  Surgeon: Lynann Bologna, MD;  Location: [facility] ENDOSCOPY;  Service: Endoscopy;  Laterality: N/A;   HOT HEMOSTASIS N/A 06/23/2019   Procedure: HOT HEMOSTASIS (ARGON PLASMA COAGULATION/BICAP);  Surgeon: Shellia Cleverly, DO;  Location: [facility] ENDOSCOPY;  Service: Gastroenterology;  Laterality: N/A;   IR PARACENTESIS  08/21/2022   IR PARACENTESIS  08/26/2022     A IV Location/Drains/Wounds Patient Lines/Drains/Airways Status     Active Line/Drains/Airways     Name Placement date Placement time Site Days   Peripheral IV 08/19/22 20 G 2.5" Anterior;Left;Proximal;Upper Arm 08/19/22  1745  Arm  7   Peripheral IV 08/20/22 20 G Anterior;Left Forearm 08/20/22  1731  Forearm  6   Peripheral IV 08/26/22 20 G Anterior;Proximal;Right Forearm 08/26/22  0851  Forearm  less than 1   External Urinary Catheter 08/17/22  2145  no documentation  9            Intake/Output Last 24 hours No intake or output data in the 24 hours ending 08/26/22 1555  Labs/Imaging Results for orders placed or performed during  the hospital encounter of 08/26/22 (from the past 48 hour(s))  CBC with Differential     Status: Abnormal   Collection Time: 08/26/22  8:47 AM  Result Value Ref Range   WBC 17.1 (H) 4.0 - 10.5 K/uL   RBC 4.12 (L) 4.22 - 5.81 MIL/uL   Hemoglobin 11.6 (L) 13.0 - 17.0 g/dL   HCT  29.5 (L) 62.1 - 52.0 %   MCV 89.6 80.0 - 100.0 fL   MCH 28.2 26.0 - 34.0 pg   MCHC 31.4 30.0 - 36.0 g/dL   RDW 30.8 (H) 65.7 - 84.6 %   Platelets 558 (H) 150 - 400 K/uL   nRBC 0.0 0.0 - 0.2 %   Neutrophils Relative % 79 %   Neutro Abs 13.6 (H) 1.7 - 7.7 K/uL   Lymphocytes Relative 7 %   Lymphs Abs 1.3 0.7 - 4.0 K/uL   Monocytes Relative 9 %   Monocytes Absolute 1.5 (H) 0.1 - 1.0 K/uL   Eosinophils Relative 1 %   Eosinophils Absolute 0.1 0.0 - 0.5 K/uL   Basophils Relative 1 %   Basophils Absolute 0.1 0.0 - 0.1 K/uL   WBC Morphology Mild Left Shift (1-5% metas, occ myelo)    RBC Morphology MORPHOLOGY UNREMARKABLE    Smear Review PLATELETS APPEAR INCREASED    Immature Granulocytes 3 %   Abs Immature Granulocytes 0.49 (H) 0.00 - 0.07 K/uL    Comment: Performed at [facility], [address], [place], [place] [address]  Comprehensive metabolic panel     Status: Abnormal   Collection Time: 08/26/22  8:47 AM  Result Value Ref Range   Sodium 132 (L) 135 - 145 mmol/L   Potassium 3.2 (L) 3.5 - 5.1 mmol/L   Chloride 98 98 - 111 mmol/L   CO2 16 (L) 22 - 32 mmol/L   Glucose, Bld 103 (H) 70 - 99 mg/dL    Comment: Glucose reference range applies only to samples taken after fasting for at least 8 hours.   BUN 8 6 - 20 mg/dL   Creatinine, Ser 2.84 0.61 - 1.24 mg/dL   Calcium 7.6 (L) 8.9 - 10.3 mg/dL   Total Protein 6.5 6.5 - 8.1 g/dL   Albumin 2.0 (L) 3.5 - 5.0 g/dL   AST 30 15 - 41 U/L   ALT 13 0 - 44 U/L   Alkaline Phosphatase 69 38 - 126 U/L   Total Bilirubin 0.8 0.3 - 1.2 mg/dL   GFR, Estimated >13 >24 mL/min    Comment: (NOTE) Calculated using the CKD-EPI Creatinine Equation (2021)    Anion gap 18 (H) 5 - 15    Comment: Performed at [facility], [address] [place], [place] [address]  Lactic acid, plasma     Status: Abnormal   Collection Time: 08/26/22  8:47 AM  Result Value Ref Range   Lactic Acid, Venous 2.1 (HH) 0.5 - 1.9 mmol/L    Comment: CRITICAL RESULT  CALLED TO, READ BACK BY AND VERIFIED WITH Mickle Mallory, RN @ 10:08 07.01.24 JLASIGAN Performed at [facility], [address], [place], [place] [address]   Magnesium     Status: Abnormal   Collection Time: 08/26/22  8:50 AM  Result Value Ref Range   Magnesium 1.1 (L) 1.7 - 2.4 mg/dL    Comment: Performed at [facility], [address] [place], [place] [address]  Resp panel by RT-PCR (RSV, Flu A&B, Covid) Anterior Nasal Swab  Status: None   Collection Time: 08/26/22  9:52 AM   Specimen: Anterior Nasal Swab  Result Value Ref Range   SARS Coronavirus 2 by RT PCR NEGATIVE NEGATIVE   Influenza A by PCR NEGATIVE NEGATIVE   Influenza B by PCR NEGATIVE NEGATIVE    Comment: (NOTE) The Xpert Xpress SARS-CoV-2/FLU/RSV plus assay is intended as an aid in the diagnosis of influenza from Nasopharyngeal swab specimens and should not be used as a sole basis for treatment. Nasal washings and aspirates are unacceptable for Xpert Xpress SARS-CoV-2/FLU/RSV testing.  Fact Sheet for Patients: BloggerCourse.com  Fact Sheet for Healthcare Providers: SeriousBroker.it  This test is not yet approved or cleared by the [place] FDA and has been authorized for detection and/or diagnosis of SARS-CoV-2 by FDA under an Emergency Use Authorization (EUA). This EUA will remain in effect (meaning this test can be used) for the duration of the COVID-19 declaration under Section 564(b)(1) of the Act, 21 U.S.C. section 360bbb-3(b)(1), unless the authorization is terminated or revoked.     Resp Syncytial Virus by PCR NEGATIVE NEGATIVE    Comment: (NOTE) Fact Sheet for Patients: BloggerCourse.com  Fact Sheet for Healthcare Providers: SeriousBroker.it  This test is not yet approved or cleared by the [place] FDA and has been authorized for detection and/or diagnosis of SARS-CoV-2  by FDA under an Emergency Use Authorization (EUA). This EUA will remain in effect (meaning this test can be used) for the duration of the COVID-19 declaration under Section 564(b)(1) of the Act, 21 U.S.C. section 360bbb-3(b)(1), unless the authorization is terminated or revoked.  Performed at [facility], [address], [place], [place] [address]   Protime-INR     Status: Abnormal   Collection Time: 08/26/22 10:02 AM  Result Value Ref Range   Prothrombin Time 15.3 (H) 11.4 - 15.2 seconds   INR 1.2 0.8 - 1.2    Comment: (NOTE) INR goal varies based on device and disease states. Performed at [facility], [address] [place], [place] [address]   APTT     Status: None   Collection Time: 08/26/22 10:02 AM  Result Value Ref Range   aPTT 34 24 - 36 seconds    Comment: Performed at [facility], [address], [place], [place] [address]  Lactic acid, plasma     Status: None   Collection Time: 08/26/22 10:22 AM  Result Value Ref Range   Lactic Acid, Venous 1.4 0.5 - 1.9 mmol/L    Comment: Performed at [facility], [address], [place], [place] [address]  Lipase, blood     Status: Abnormal   Collection Time: 08/26/22 10:50 AM  Result Value Ref Range   Lipase 445 (H) 11 - 51 U/L    Comment: RESULTS CONFIRMED BY MANUAL DILUTION Performed at [facility], [address], [place], [place] [address]   Body fluid cell count with differential     Status: Abnormal   Collection Time: 08/26/22  2:10 PM  Result Value Ref Range   Fluid Type-FCT ABDOMEN    Color, Fluid ORANGE (A) YELLOW   Appearance, Fluid CLOUDY (A) CLEAR   Total Nucleated Cell Count, Fluid 3,592 (H) 0 - 1,000 cu mm   Neutrophil Count, Fluid 77 (H) 0 - 25 %   Lymphs, Fluid 1 %   Monocyte-Macrophage-Serous Fluid 22 (L) 50 - 90 %   Eos, Fluid 0 %    Comment: Performed at [facility], [address] [place], [place] [address]  Albumin, pleural or peritoneal fluid      Status:  None   Collection Time: 08/26/22  2:10 PM  Result Value Ref Range   Albumin, Fluid 1.5 g/dL    Comment: (NOTE) No normal range established for this test Results should be evaluated in conjunction with serum values    Fluid Type-FALB ABDOMEN     Comment: Performed at [facility], [address], [place], [place] [address]  Glucose, pleural or peritoneal fluid     Status: None   Collection Time: 08/26/22  2:10 PM  Result Value Ref Range   Glucose, Fluid 89 mg/dL    Comment: (NOTE) No normal range established for this test Results should be evaluated in conjunction with serum values    Fluid Type-FGLU ABDOMEN     Comment: Performed at [facility], [address] [place], [place] [address]   IR Paracentesis  Result Date: 08/26/2022 INDICATION: 57 year old male. History of alcohol abuse with chronic pancreatitis presented to ED with abdominal pain. Patient presents for therapeutic and diagnostic paracentesis EXAM: ULTRASOUND GUIDED THERAPEUTIC AND DIAGNOSTIC PARACENTESIS MEDICATIONS: Lidocaine 1% 10 mL COMPLICATIONS: None immediate. PROCEDURE: Informed written consent was obtained from the patient after a discussion of the risks, benefits and alternatives to treatment. A timeout was performed prior to the initiation of the procedure. Initial ultrasound scanning demonstrates a moderate amount of ascites within the right lower abdominal quadrant. The right lower abdomen was prepped and draped in the usual sterile fashion. 1% lidocaine was used for local anesthesia. Following this, a 19 gauge, 7-cm, Yueh catheter was introduced. An ultrasound image was saved for documentation purposes. The paracentesis was performed. The catheter was removed and a dressing was applied. The patient tolerated the procedure well without immediate post procedural complication. FINDINGS: A total of approximately 3.1 L of serosanguineous fluid was removed. Samples were sent to the laboratory as requested by  the clinical team. IMPRESSION: Successful ultrasound-guided paracentesis yielding 3.1 liters of peritoneal fluid. Performed by: Anders Grant, NP Electronically Signed   By: Marliss Coots M.D.   On: 08/26/2022 15:28   CT ABDOMEN PELVIS W CONTRAST  Result Date: 08/26/2022 CLINICAL DATA:  Acute abdominal pain EXAM: CT ABDOMEN AND PELVIS WITH CONTRAST TECHNIQUE: Multidetector CT imaging of the abdomen and pelvis was performed using the standard protocol following bolus administration of intravenous contrast. RADIATION DOSE REDUCTION: This exam was performed according to the departmental dose-optimization program which includes automated exposure control, adjustment of the mA and/or kV according to patient size and/or use of iterative reconstruction technique. CONTRAST:  75mL OMNIPAQUE IOHEXOL 350 MG/ML SOLN COMPARISON:  CT 08/21/2022 and older FINDINGS: Lower chest: Coronary artery calcifications are seen. Please correlate for other coronary risk factors. Decreasing pleural effusions and adjacent opacities with some residual. Recommend continued follow-up. Hepatobiliary: Nodular contours of the diffusely fatty liver. Please correlate for history of chronic liver disease. Gallbladder is nondilated. Portal vein is patent. Pancreas: Preserved pancreatic parenchyma. There are some calcifications in the pancreatic head near the course of the pancreatic duct, unchanged from previous. Spleen: Normal in size without focal abnormality. Adrenals/Urinary Tract: Adrenal glands are preserved. Kidneys are unremarkable. No enhancing mass or collecting system dilatation. Normal caliber ureters down to the bladder. Bladder has a preserved contour. Bladder is underdistended. Stomach/Bowel: No oral contrast. Large bowel has a normal course and caliber with scattered stool. Stomach is underdistended. Small bowel is also nondilated. Vascular/Lymphatic: Aortic atherosclerosis.  No enlarged abdominal or pelvic lymph nodes.  Reproductive: Heterogeneous prostate with some mass effect along the base of the bladder. Please correlate for BPH changes in the patient's PSA. Other: Moderate diffuse ascites once again identified. The amount of ascites is increased from the prior CT scan. Small fat containing umbilical hernia with some mesh. No free air. Musculoskeletal: Scattered degenerative changes of the spine and pelvis. There are bridging osteophytes along the sacroiliac joints. Trace retrolisthesis of L5 on S1 with some disc bulging and stenosis. IMPRESSION: Increasing ascites. Nodular fatty liver. Stable areas of pancreatic calcification. Decreasing pleural effusions and adjacent opacities with some residual. Recommend continued follow-up Electronically Signed   By: Karen Kays M.D.   On: 08/26/2022 11:09   DG Chest 2 View  Result Date: 08/26/2022 CLINICAL DATA:  Epigastric pain EXAM: CHEST - 2 VIEW COMPARISON:  Previous studies including the examination of 08/17/2022 FINDINGS: Cardiac size is within normal limits. Low lung volumes. Linear densities are seen in the lower lung fields. There are no signs of alveolar pulmonary edema or focal pulmonary consolidation. There is minimal blunting of lateral CP angles. There is no pneumothorax. IMPRESSION: There are increased markings in the lower lung fields, more so on the right side suggesting atelectasis. There is blunting of lateral CP angles suggesting small bilateral effusions. Electronically Signed   By: Ernie Avena M.D.   On: 08/26/2022 10:35    Pending Labs Unresulted Labs (From admission, onward)     Start     Ordered   08/27/22 0500  Lipase, blood  Tomorrow morning,   R        08/26/22 1505   08/27/22 0500  CBC  Tomorrow morning,   R        08/26/22 1544   08/27/22 0500  Magnesium  Tomorrow morning,   R        08/26/22 1553   08/26/22 1410  Gram stain  RELEASE UPON ORDERING,   STAT        08/26/22 1410   08/26/22 1410  Culture, body fluid w Gram Stain-bottle   Once,   R        08/26/22 1410   08/26/22 1410  Pathologist smear review  Once,   R        08/26/22 1410   08/26/22 0952  Urine Culture (for pregnant, neutropenic or urologic patients or patients with an indwelling urinary catheter)  (Septic presentation on arrival (screening labs, nursing and treatment orders for obvious sepsis))  Once,   URGENT       Question Answer Comment  Indication Sepsis   Patient immune status Normal   Release to patient Immediate      08/26/22 0953   08/26/22 0849  Urinalysis, Routine w reflex microscopic -Urine, Clean Catch  Once,   URGENT       Question Answer Comment  Specimen Source Urine, Clean Catch   Release to patient Immediate      08/26/22 0848   08/26/22 0847  Blood culture (routine x 2)  BLOOD CULTURE X 2,   R (with STAT occurrences)     Question Answer Comment  Patient immune status Normal   Release to patient Immediate      08/26/22 0848            Vitals/Pain Today's Vitals   08/26/22 1200 08/26/22 1220 08/26/22 1300 08/26/22 1500  BP: 127/86  116/89 114/88  Pulse: (Abnormal) 122  (Abnormal) 121 (Abnormal) 116  Resp: 17  (  Abnormal) 23 17  Temp:  97.6 F (36.4 C)    TempSrc:  Oral    SpO2: 96%  96% 100%  Weight:      Height:      PainSc:        Isolation Precautions No active isolations  Medications Medications  ceFEPIme (MAXIPIME) 2 g in sodium chloride 0.9 % 100 mL IVPB (has no administration in time range)  LORazepam (ATIVAN) tablet 1-4 mg (has no administration in time range)    Or  LORazepam (ATIVAN) injection 1-4 mg (has no administration in time range)  lidocaine (PF) (XYLOCAINE) 1 % injection 10 mL (10 mLs Intradermal Not Given 08/26/22 1521)  lipase/protease/amylase (CREON) capsule 12,000 Units (has no administration in time range)  carvedilol (COREG) tablet 3.125 mg (has no administration in time range)  hydrOXYzine (ATARAX) tablet 25 mg (has no administration in time range)  ondansetron (ZOFRAN) tablet 8 mg  (has no administration in time range)  pantoprazole (PROTONIX) EC tablet 40 mg (has no administration in time range)  sucralfate (CARAFATE) tablet 1 g (has no administration in time range)  folic acid (FOLVITE) tablet 1 mg (has no administration in time range)  multivitamin with minerals tablet 1 tablet (has no administration in time range)  thiamine (VITAMIN B1) tablet 100 mg (has no administration in time range)  enoxaparin (LOVENOX) injection 40 mg (has no administration in time range)  ketorolac (TORADOL) 30 MG/ML injection 30 mg (has no administration in time range)  potassium chloride SA (KLOR-CON M) CR tablet 40 mEq (has no administration in time range)  lactated ringers bolus 1,000 mL (0 mLs Intravenous Stopped 08/26/22 1006)  ondansetron (ZOFRAN) injection 4 mg (4 mg Intravenous Given 08/26/22 0856)  HYDROmorphone (DILAUDID) injection 1 mg (1 mg Intravenous Given 08/26/22 0856)  ceFEPIme (MAXIPIME) 2 g in sodium chloride 0.9 % 100 mL IVPB (0 g Intravenous Stopped 08/26/22 1051)  metroNIDAZOLE (FLAGYL) IVPB 500 mg (0 mg Intravenous Stopped 08/26/22 1213)  iohexol (OMNIPAQUE) 350 MG/ML injection 75 mL (75 mLs Intravenous Contrast Given 08/26/22 1047)  magnesium sulfate IVPB 2 g 50 mL (0 g Intravenous Stopped 08/26/22 1254)  potassium chloride SA (KLOR-CON M) CR tablet 40 mEq (40 mEq Oral Given 08/26/22 1146)  LORazepam (ATIVAN) injection 1 mg (1 mg Intravenous Given 08/26/22 1516)  HYDROmorphone (DILAUDID) injection 1 mg (1 mg Intravenous Given 08/26/22 1519)    Mobility walks with device     Focused Assessments GI    R Recommendations: See Admitting Provider Note  Report given to:   Additional Notes:

## 2022-08-26 NOTE — Progress Notes (Signed)
Pharmacy Antibiotic Note  Samuel Blevins is a 57 y.o. male admitted on 08/26/2022 presenting with abdominal pain concerned for intra-abdominal infection, PMHx of pancreatitis and SBP.  Pharmacy has been consulted for Cefepime dosing. Afebrile. WBC 17.1.  Plan: Cefepime 2 g every 8 hours Monitor Bcx, renal function, and ID work-up to tailor ABX as indicated.  Height: 5\' 9"  (175.3 cm) Weight: 77.1 kg (170 lb) IBW/kg (Calculated) : 70.7  Temp (24hrs), Avg:97.6 F (36.4 C), Min:97.6 F (36.4 C), Max:97.6 F (36.4 C)  Recent Labs  Lab 08/20/22 0026 08/21/22 0051 08/22/22 0059 08/26/22 0847  WBC 13.1* 9.7  --  17.1*  CREATININE 1.04 0.92 0.86 1.11  LATICACIDVEN  --   --   --  2.1*    Estimated Creatinine Clearance: 72.5 mL/min (by C-G formula based on SCr of 1.11 mg/dL).    No Known Allergies  Thank you for allowing pharmacy to be a part of this patient's care.  Laqueta Jean PharmD Candidate

## 2022-08-26 NOTE — Procedures (Signed)
Ultrasound-guided diagnostic and therapeutic paracentesis performed yielding 3.1 liters of serosanguinous colored fluid.  Fluid was sent to lab for analysis. No immediate complications. EBL is none.

## 2022-08-26 NOTE — H&P (Addendum)
History and Physical    Samuel Blevins ZOX:[id number] DOB: 12/01/1965 DOA: 08/26/2022  PCP: [name], MD (Confirm with patient/family/NH records and if not entered, this has to be entered at [facility] point of entry) Patient coming from: Home  I have personally briefly reviewed patient's old medical records in [facility]  Chief Complaint: Recurrent abdominal pain nausea vomiting diarrhea  HPI: Samuel Blevins is a 57 y.o. male with medical history significant of alcohol abuse, recurrent pancreatitis, chronic pancreatic insufficiency, alcoholic cirrhosis, HTN, presented with worsening of abdominal pain nauseous vomiting diarrhea.  Patient was recently hospitalized for similar presentation left AMA on last Thursday on last admission paracentesis showed diagnosed SBP and plan was for patient to start IV antibiotics however patient left AMA.  Patient went back and started to drink again every day and last drink was this morning.  Since last Friday patient started to have recurrent abdominal pain, described as sharp " belt like" across umbilical area, associated with frequent nauseous vomiting and watery diarrhea 2 times a day every day and last episode was this morning.  Denies any fever chills no tenesmus. ED Course: Tachycardia, nonhypotensive lipase 445, lactic acid within normal limits, WBC 17.1 bicarb 16, creatinine 1.1 K3.2, magnesium 1.1.  Patient was given IV fluid continuously, with a bolus of 1000 mL, multiple rounds of pain medications and cefepime to treat potential SBP.  CT abdominal pelvis showed acute abnormalities.  Review of Systems: As per HPI otherwise 14 point review of systems negative.    Past Medical History:  Diagnosis Date   Alcohol withdrawal syndrome with complication (HCC)    Alcoholism (HCC)    Elevated AST (SGOT)    Gastrointestinal hemorrhage    Homeless    Hypertension    Pancreatic insufficiency    takes Creon   Symptomatic anemia 11/23/2015    Thrombocytopenia (HCC) 05/09/2021    Past Surgical History:  Procedure Laterality Date   BIOPSY  06/23/2019   Procedure: BIOPSY;  Surgeon: Shellia Cleverly, DO;  Location: [facility] ENDOSCOPY;  Service: Gastroenterology;;   BIOPSY  12/12/2020   Procedure: BIOPSY;  Surgeon: Lynann Bologna, MD;  Location: [facility] ENDOSCOPY;  Service: Endoscopy;;   BIOPSY  08/04/2021   Procedure: BIOPSY;  Surgeon: Jenel Lucks, MD;  Location: [facility] ENDOSCOPY;  Service: Gastroenterology;;   ESOPHAGOGASTRODUODENOSCOPY N/A 08/04/2021   Procedure: ESOPHAGOGASTRODUODENOSCOPY (EGD);  Surgeon: Jenel Lucks, MD;  Location: [facility] ENDOSCOPY;  Service: Gastroenterology;  Laterality: N/A;   ESOPHAGOGASTRODUODENOSCOPY (EGD) WITH PROPOFOL N/A 06/23/2019   Procedure: ESOPHAGOGASTRODUODENOSCOPY (EGD) WITH PROPOFOL;  Surgeon: Shellia Cleverly, DO;  Location: [facility] ENDOSCOPY;  Service: Gastroenterology;  Laterality: N/A;   ESOPHAGOGASTRODUODENOSCOPY (EGD) WITH PROPOFOL N/A 12/12/2020   Procedure: ESOPHAGOGASTRODUODENOSCOPY (EGD) WITH PROPOFOL;  Surgeon: Lynann Bologna, MD;  Location: [facility] ENDOSCOPY;  Service: Endoscopy;  Laterality: N/A;   HOT HEMOSTASIS N/A 06/23/2019   Procedure: HOT HEMOSTASIS (ARGON PLASMA COAGULATION/BICAP);  Surgeon: Shellia Cleverly, DO;  Location: [facility] ENDOSCOPY;  Service: Gastroenterology;  Laterality: N/A;   IR PARACENTESIS  08/21/2022   IR PARACENTESIS  08/26/2022     reports that he has been smoking cigarettes. He has a 15.00 pack-year smoking history. He has never used smokeless tobacco. He reports current alcohol use of about 28.0 standard drinks of alcohol per week. He reports that he does not use drugs.  No Known Allergies  Family History  Problem Relation Age of Onset   Diabetes Mellitus II Neg Hx    Colon cancer Neg Hx  Stomach cancer Neg Hx    Pancreatic cancer Neg Hx     Prior to Admission medications   Medication Sig Start Date End Date Taking? Authorizing Provider  amLODipine (NORVASC) 5  MG tablet Take 5 mg by mouth daily.   Yes [provider]  carvedilol (COREG) 3.125 MG tablet Take 1 tablet (3.125 mg total) by mouth 2 (two) times daily with a meal. 07/10/22  Yes [name], MD  folic acid (FOLVITE) 1 MG tablet Take 1 tablet (1 mg total) by mouth daily. 07/14/22  Yes Rolly Salter, MD  hydrOXYzine (ATARAX) 25 MG tablet Take 25 mg by mouth at bedtime as needed for anxiety.   Yes [provider]  Iron, Ferrous Sulfate, 325 (65 Fe) MG TABS Take 1 tablet ( 325 mg) by mouth daily. 05/24/22  Yes [name], MD  lipase/protease/amylase (CREON) 12000-38000 units CPEP capsule Take 1 capsule (12,000 Units total) by mouth 3 (three) times daily before meals. 07/14/22  Yes Rolly Salter, MD  magnesium oxide (MAG-OX) 400 MG tablet Take 1 tablet (400 mg total) by mouth in the morning and at bedtime. 07/14/22  Yes Rolly Salter, MD  Multiple Vitamin (MULTIVITAMIN WITH MINERALS) TABS tablet Take 1 tablet by mouth daily. 07/14/22  Yes Rolly Salter, MD  ondansetron (ZOFRAN) 8 MG tablet Take 8 mg by mouth every 6 (six) hours as needed for nausea or vomiting.   Yes [provider]  pantoprazole (PROTONIX) 40 MG tablet Take 1 tablet (40 mg total) by mouth 2 (two) times daily before a meal. 07/14/22  Yes Rolly Salter, MD  polyethylene glycol (MIRALAX / GLYCOLAX) 17 g packet Take 17 g by mouth daily as needed for mild constipation or moderate constipation. 07/14/22  Yes Rolly Salter, MD  sucralfate (CARAFATE) 1 g tablet Take 1 g by mouth See admin instructions. Take 1 tablet by mouth 4 times daily with meals and bedtime   Yes [provider]  thiamine (VITAMIN B-1) 100 MG tablet Take 1 tablet (100 mg total) by mouth daily. 07/14/22  Yes Rolly Salter, MD  traMADol (ULTRAM) 50 MG tablet Take 1 tablet (50 mg total) by mouth every 8 (eight) hours as needed for severe pain. 07/17/22  Yes [name], MD  nicotine (NICODERM CQ - DOSED IN MG/24 HOURS)  14 mg/24hr patch Place 1 patch (14 mg total) onto the skin daily. Patient not taking: Reported on 08/21/2022 07/14/22   Rolly Salter, MD    Physical Exam: Vitals:   08/26/22 1200 08/26/22 1220 08/26/22 1300 08/26/22 1500  BP: 127/86  116/89 114/88  Pulse: (!) 122  (!) 121 (!) 116  Resp: 17  (!) 23 17  Temp:  97.6 F (36.4 C)    TempSrc:  Oral    SpO2: 96%  96% 100%  Weight:      Height:        Constitutional: NAD, calm, comfortable Vitals:   08/26/22 1200 08/26/22 1220 08/26/22 1300 08/26/22 1500  BP: 127/86  116/89 114/88  Pulse: (!) 122  (!) 121 (!) 116  Resp: 17  (!) 23 17  Temp:  97.6 F (36.4 C)    TempSrc:  Oral    SpO2: 96%  96% 100%  Weight:      Height:       Eyes: PERRL, lids and conjunctivae normal ENMT: Mucous membranes are moist. Posterior pharynx clear of any exudate or lesions.Normal dentition.  Neck: normal,  supple, no masses, no thyromegaly Respiratory: clear to auscultation bilaterally, no wheezing, no crackles. Normal respiratory effort. No accessory muscle use.  Cardiovascular: Regular rate and rhythm, no murmurs / rubs / gallops. No extremity edema. 2+ pedal pulses. No carotid bruits.  Abdomen: tenderness on periumbilical area, no rebound no guarding, no masses palpated. No hepatosplenomegaly. Bowel sounds positive.  Musculoskeletal: no clubbing / cyanosis. No joint deformity upper and lower extremities. Good ROM, no contractures. Normal muscle tone.  Skin: no rashes, lesions, ulcers. No induration Neurologic: CN 2-12 grossly intact. Sensation intact, DTR normal. Strength 5/5 in all 4.  Psychiatric: Normal judgment and insight. Alert and oriented x 3. Normal mood.     Labs on Admission: I have personally reviewed following labs and imaging studies  CBC: Recent Labs  Lab 08/20/22 0026 08/21/22 0051 08/26/22 0847  WBC 13.1* 9.7 17.1*  NEUTROABS  --   --  13.6*  HGB 9.0* 8.6* 11.6*  HCT 26.5* 26.3* 36.9*  MCV 86.9 90.4 89.6  PLT 424* 404*  558*   Basic Metabolic Panel: Recent Labs  Lab 08/20/22 0026 08/21/22 0051 08/22/22 0059 08/26/22 0847 08/26/22 0850  NA 125* 128* 131* 132*  --   K 3.5 3.4* 3.9 3.2*  --   CL 91* 96* 100 98  --   CO2 20* 22 15* 16*  --   GLUCOSE 84 100* 67* 103*  --   BUN 18 12 6 8   --   CREATININE 1.04 0.92 0.86 1.11  --   CALCIUM 7.3* 7.3* 7.4* 7.6*  --   MG 2.0 1.8 1.5*  --  1.1*  PHOS  --   --  2.3*  --   --    GFR: Estimated Creatinine Clearance: 72.5 mL/min (by C-G formula based on SCr of 1.11 mg/dL). Liver Function Tests: Recent Labs  Lab 08/20/22 0026 08/21/22 0051 08/22/22 0059 08/26/22 0847  AST 51* 66* 47* 30  ALT 17 18 15 13   ALKPHOS 79 77 81 69  BILITOT 1.4* 0.8 1.3* 0.8  PROT 5.8* 5.5* 5.8* 6.5  ALBUMIN 2.1* 1.9* 2.0* 2.0*   Recent Labs  Lab 08/20/22 0026 08/21/22 0051 08/22/22 0059 08/26/22 1050  LIPASE 138* 140* 145* 445*   No results for input(s): "AMMONIA" in the last 168 hours. Coagulation Profile: Recent Labs  Lab 08/20/22 1200 08/26/22 1002  INR 1.1 1.2   Cardiac Enzymes: No results for input(s): "CKTOTAL", "CKMB", "CKMBINDEX", "TROPONINI" in the last 168 hours. BNP (last 3 results) No results for input(s): "PROBNP" in the last 8760 hours. HbA1C: No results for input(s): "HGBA1C" in the last 72 hours. CBG: No results for input(s): "GLUCAP" in the last 168 hours. Lipid Profile: No results for input(s): "CHOL", "HDL", "LDLCALC", "TRIG", "CHOLHDL", "LDLDIRECT" in the last 72 hours. Thyroid Function Tests: No results for input(s): "TSH", "T4TOTAL", "FREET4", "T3FREE", "THYROIDAB" in the last 72 hours. Anemia Panel: No results for input(s): "VITAMINB12", "FOLATE", "FERRITIN", "TIBC", "IRON", "RETICCTPCT" in the last 72 hours. Urine analysis:    Component Value Date/Time   COLORURINE AMBER (A) 08/17/2022 1845   APPEARANCEUR HAZY (A) 08/17/2022 1845   LABSPEC 1.027 08/17/2022 1845   PHURINE 5.0 08/17/2022 1845   GLUCOSEU NEGATIVE 08/17/2022 1845    HGBUR NEGATIVE 08/17/2022 1845   BILIRUBINUR NEGATIVE 08/17/2022 1845   KETONESUR 20 (A) 08/17/2022 1845   PROTEINUR 30 (A) 08/17/2022 1845   NITRITE NEGATIVE 08/17/2022 1845   LEUKOCYTESUR TRACE (A) 08/17/2022 1845    Radiological Exams on Admission: IR Paracentesis  Result Date: 08/26/2022 INDICATION: 57 year old male. History of alcohol abuse with chronic pancreatitis presented to ED with abdominal pain. Patient presents for therapeutic and diagnostic paracentesis EXAM: ULTRASOUND GUIDED THERAPEUTIC AND DIAGNOSTIC PARACENTESIS MEDICATIONS: Lidocaine 1% 10 mL COMPLICATIONS: None immediate. PROCEDURE: Informed written consent was obtained from the patient after a discussion of the risks, benefits and alternatives to treatment. A timeout was performed prior to the initiation of the procedure. Initial ultrasound scanning demonstrates a moderate amount of ascites within the right lower abdominal quadrant. The right lower abdomen was prepped and draped in the usual sterile fashion. 1% lidocaine was used for local anesthesia. Following this, a 19 gauge, 7-cm, Yueh catheter was introduced. An ultrasound image was saved for documentation purposes. The paracentesis was performed. The catheter was removed and a dressing was applied. The patient tolerated the procedure well without immediate post procedural complication. FINDINGS: A total of approximately 3.1 L of serosanguineous fluid was removed. Samples were sent to the laboratory as requested by the clinical team. IMPRESSION: Successful ultrasound-guided paracentesis yielding 3.1 liters of peritoneal fluid. Performed by: Anders Grant, NP Electronically Signed   By: Marliss Coots M.D.   On: 08/26/2022 15:28   CT ABDOMEN PELVIS W CONTRAST  Result Date: 08/26/2022 CLINICAL DATA:  Acute abdominal pain EXAM: CT ABDOMEN AND PELVIS WITH CONTRAST TECHNIQUE: Multidetector CT imaging of the abdomen and pelvis was performed using the standard protocol  following bolus administration of intravenous contrast. RADIATION DOSE REDUCTION: This exam was performed according to the departmental dose-optimization program which includes automated exposure control, adjustment of the mA and/or kV according to patient size and/or use of iterative reconstruction technique. CONTRAST:  75mL OMNIPAQUE IOHEXOL 350 MG/ML SOLN COMPARISON:  CT 08/21/2022 and older FINDINGS: Lower chest: Coronary artery calcifications are seen. Please correlate for other coronary risk factors. Decreasing pleural effusions and adjacent opacities with some residual. Recommend continued follow-up. Hepatobiliary: Nodular contours of the diffusely fatty liver. Please correlate for history of chronic liver disease. Gallbladder is nondilated. Portal vein is patent. Pancreas: Preserved pancreatic parenchyma. There are some calcifications in the pancreatic head near the course of the pancreatic duct, unchanged from previous. Spleen: Normal in size without focal abnormality. Adrenals/Urinary Tract: Adrenal glands are preserved. Kidneys are unremarkable. No enhancing mass or collecting system dilatation. Normal caliber ureters down to the bladder. Bladder has a preserved contour. Bladder is underdistended. Stomach/Bowel: No oral contrast. Large bowel has a normal course and caliber with scattered stool. Stomach is underdistended. Small bowel is also nondilated. Vascular/Lymphatic: Aortic atherosclerosis. No enlarged abdominal or pelvic lymph nodes. Reproductive: Heterogeneous prostate with some mass effect along the base of the bladder. Please correlate for BPH changes in the patient's PSA. Other: Moderate diffuse ascites once again identified. The amount of ascites is increased from the prior CT scan. Small fat containing umbilical hernia with some mesh. No free air. Musculoskeletal: Scattered degenerative changes of the spine and pelvis. There are bridging osteophytes along the sacroiliac joints. Trace  retrolisthesis of L5 on S1 with some disc bulging and stenosis. IMPRESSION: Increasing ascites. Nodular fatty liver. Stable areas of pancreatic calcification. Decreasing pleural effusions and adjacent opacities with some residual. Recommend continued follow-up Electronically Signed   By: Karen Kays M.D.   On: 08/26/2022 11:09   DG Chest 2 View  Result Date: 08/26/2022 CLINICAL DATA:  Epigastric pain EXAM: CHEST - 2 VIEW COMPARISON:  Previous studies including the examination of 08/17/2022 FINDINGS: Cardiac size is within normal limits. Low lung volumes. Linear densities are seen  in the lower lung fields. There are no signs of alveolar pulmonary edema or focal pulmonary consolidation. There is minimal blunting of lateral CP angles. There is no pneumothorax. IMPRESSION: There are increased markings in the lower lung fields, more so on the right side suggesting atelectasis. There is blunting of lateral CP angles suggesting small bilateral effusions. Electronically Signed   By: Ernie Avena M.D.   On: 08/26/2022 10:35    EKG: Independently reviewed.  Sinus tachycardia, no acute ST changes.  Assessment/Plan Active Problems:   Alcohol dependence syndrome (HCC)   Pancreatitis   Acute pancreatitis  (please populate well all problems here in Problem List. (For example, if patient is on BP meds at home and you resume or decide to hold them, it is a problem that needs to be her. Same for CAD, COPD, HLD and so on)  Sepsis, improving -Evidenced by tachycardia, increased leukocytosis, suspected infection source like SB given the most recent paracentesis on 6/26 showed picture of SBP however culture from same paracentesis showed no growth. -Appreciate IR effort of paracentesis this afternoon removed 3.1 L ascites and further analysis and culture pending.  For now plan to maintain patient on cefepime until SBP ruled out. -Given significant anasarca from cirrhosis, hold off maintenance IV fluid as well as  blood pressure stabilized and lactic acid level normalized. -Other DDx, diarrhea.  Be chronic most likely secondary to chronic pancreatic insufficiency and noncompliant with Creon, will also add probiotics  Recurrent pancreatitis, acute on chronic, alcoholic, BISAP=1 -Pain control, Toradol -Other supportive care  Severe hypomagnesemia -Secondary to diarrhea, IV replacement, recheck level tomorrow  Hypokalemia -P.o. replacement  Alcohol abuse -No symptoms signs of active withdrawal, start CIWA protocol with as needed benzos  Alcoholic cirrhosis With chronic hypoalbuminemia -Having diarrhea, hold off lactulose -Continue beta-blocker  DVT prophylaxis: Lovenox Code Status: Full code Family Communication: None at bedside Disposition Plan: Expect less than 2 midnight hospital stay Consults called: None Admission status: Tele obs   Emeline General MD [facility] Pager [phone]  08/26/2022, 3:50 PM

## 2022-08-26 NOTE — ED Notes (Signed)
Help get patient into a gon on the monitor did EKG shown to Dr [facility] patient is resting with call bell in reach

## 2022-08-26 NOTE — ED Notes (Signed)
Pt transferred to IR 

## 2022-08-26 NOTE — ED Triage Notes (Addendum)
Pt to ED via em[facility] from motel with c/o generalized abd pain since wednesday. Pt reports it started once he started back drinking alcohol last week. Rpt hx of pancreatitis. Pt Aox4 min triage, 10/10 abd pain denies n/v. Pt reports pain is [place] with abd palpation. Stats last drink was last night.

## 2022-08-26 NOTE — ED Provider Notes (Signed)
[facility] EMERGENCY DEPARTMENT AT [facility] Provider Note   CSN: [id number] Arrival date & time: 08/26/22  4098     History Chief Complaint  Patient presents with   Abdominal Pain    Samuel Blevins is a 57 y.o. male.  Patient with past history significant for chronic alcohol abuse, alcoholic pancreatitis who presents to the emergency department complaints of abdominal pain.  Patient was picked up from motel after complaining of generalized abdominal pain that began approximately 5 days ago.  He reports that he was experiencing some mild pain and thought that drinking alcohol would alleviate this pain but this has instead exacerbated his pain.  Patient is previously been seen and admitted to the hospital numerous times for alcohol use induced acute pancreatitis.  Currently endorsing 10 out of 10 pain in the epigastric region with some associated vomiting.  Denies any diarrhea.  Denies any recent fevers, chest pain, shortness of breath.   Abdominal Pain      Home Medications Prior to Admission medications   Medication Sig Start Date End Date Taking? Authorizing Provider  amLODipine (NORVASC) 5 MG tablet Take 5 mg by mouth daily.   Yes [provider]  carvedilol (COREG) 3.125 MG tablet Take 1 tablet (3.125 mg total) by mouth 2 (two) times daily with a meal. 07/10/22  Yes [name], MD  folic acid (FOLVITE) 1 MG tablet Take 1 tablet (1 mg total) by mouth daily. 07/14/22  Yes Rolly Salter, MD  hydrOXYzine (ATARAX) 25 MG tablet Take 25 mg by mouth at bedtime as needed for anxiety.   Yes [provider]  Iron, Ferrous Sulfate, 325 (65 Fe) MG TABS Take 1 tablet ( 325 mg) by mouth daily. 05/24/22  Yes [name], MD  lipase/protease/amylase (CREON) 12000-38000 units CPEP capsule Take 1 capsule (12,000 Units total) by mouth 3 (three) times daily before meals. 07/14/22  Yes Rolly Salter, MD  magnesium oxide (MAG-OX) 400 MG tablet Take 1 tablet  (400 mg total) by mouth in the morning and at bedtime. 07/14/22  Yes Rolly Salter, MD  Multiple Vitamin (MULTIVITAMIN WITH MINERALS) TABS tablet Take 1 tablet by mouth daily. 07/14/22  Yes Rolly Salter, MD  ondansetron (ZOFRAN) 8 MG tablet Take 8 mg by mouth every 6 (six) hours as needed for nausea or vomiting.   Yes [provider]  pantoprazole (PROTONIX) 40 MG tablet Take 1 tablet (40 mg total) by mouth 2 (two) times daily before a meal. 07/14/22  Yes Rolly Salter, MD  polyethylene glycol (MIRALAX / GLYCOLAX) 17 g packet Take 17 g by mouth daily as needed for mild constipation or moderate constipation. 07/14/22  Yes Rolly Salter, MD  sucralfate (CARAFATE) 1 g tablet Take 1 g by mouth See admin instructions. Take 1 tablet by mouth 4 times daily with meals and bedtime   Yes [provider]  thiamine (VITAMIN B-1) 100 MG tablet Take 1 tablet (100 mg total) by mouth daily. 07/14/22  Yes Rolly Salter, MD  traMADol (ULTRAM) 50 MG tablet Take 1 tablet (50 mg total) by mouth every 8 (eight) hours as needed for severe pain. 07/17/22  Yes [name], MD  nicotine (NICODERM CQ - DOSED IN MG/24 HOURS) 14 mg/24hr patch Place 1 patch (14 mg total) onto the skin daily. Patient not taking: Reported on 08/21/2022 07/14/22   Rolly Salter, MD      Allergies    Patient has  no known allergies.    Review of Systems   Review of Systems  Gastrointestinal:  Positive for abdominal pain.  All other systems reviewed and are negative.   Physical Exam Updated Vital Signs BP 114/88   Pulse (!) 116   Temp 97.6 F (36.4 C) (Oral)   Resp 17   Ht 5\' 9"  (1.753 m)   Wt 77.1 kg   SpO2 100%   BMI 25.10 kg/m  Physical Exam Vitals and nursing note reviewed.  Constitutional:      General: He is not in acute distress.    Appearance: He is well-developed.  HENT:     Head: Normocephalic and atraumatic.  Eyes:     Conjunctiva/sclera: Conjunctivae normal.  Cardiovascular:     Rate  and Rhythm: Normal rate and regular rhythm.     Heart sounds: No murmur heard. Pulmonary:     Effort: Pulmonary effort is normal. No respiratory distress.     Breath sounds: Normal breath sounds.  Abdominal:     General: Bowel sounds are normal.     Tenderness: There is abdominal tenderness in the epigastric area. There is guarding.  Musculoskeletal:        General: No swelling.     Cervical back: Neck supple.  Skin:    General: Skin is warm and dry.     Capillary Refill: Capillary refill takes less than 2 seconds.  Neurological:     Mental Status: He is alert.  Psychiatric:        Mood and Affect: Mood normal.     ED Results / Procedures / Treatments   Labs (all labs ordered are listed, but only abnormal results are displayed) Labs Reviewed  CBC WITH DIFFERENTIAL/PLATELET - Abnormal; Notable for the following components:      Result Value   WBC 17.1 (*)    RBC 4.12 (*)    Hemoglobin 11.6 (*)    HCT 36.9 (*)    RDW 22.3 (*)    Platelets 558 (*)    Neutro Abs 13.6 (*)    Monocytes Absolute 1.5 (*)    Abs Immature Granulocytes 0.49 (*)    All other components within normal limits  COMPREHENSIVE METABOLIC PANEL - Abnormal; Notable for the following components:   Sodium 132 (*)    Potassium 3.2 (*)    CO2 16 (*)    Glucose, Bld 103 (*)    Calcium 7.6 (*)    Albumin 2.0 (*)    Anion gap 18 (*)    All other components within normal limits  LACTIC ACID, PLASMA - Abnormal; Notable for the following components:   Lactic Acid, Venous 2.1 (*)    All other components within normal limits  MAGNESIUM - Abnormal; Notable for the following components:   Magnesium 1.1 (*)    All other components within normal limits  PROTIME-INR - Abnormal; Notable for the following components:   Prothrombin Time 15.3 (*)    All other components within normal limits  LIPASE, BLOOD - Abnormal; Notable for the following components:   Lipase 445 (*)    All other components within normal limits   BODY FLUID CELL COUNT WITH DIFFERENTIAL - Abnormal; Notable for the following components:   Color, Fluid ORANGE (*)    Appearance, Fluid CLOUDY (*)    Total Nucleated Cell Count, Fluid 3,592 (*)    Neutrophil Count, Fluid 77 (*)    Monocyte-Macrophage-Serous Fluid 22 (*)    All other components within normal limits  RESP PANEL BY RT-PCR (RSV, FLU A&B, COVID)  RVPGX2  CULTURE, BLOOD (ROUTINE X 2)  CULTURE, BLOOD (ROUTINE X 2)  URINE CULTURE  GRAM STAIN  CULTURE, BODY FLUID W GRAM STAIN -BOTTLE  LACTIC ACID, PLASMA  APTT  ALBUMIN, PLEURAL OR PERITONEAL FLUID   GLUCOSE, PLEURAL OR PERITONEAL FLUID  URINALYSIS, ROUTINE W REFLEX MICROSCOPIC  PATHOLOGIST SMEAR REVIEW    EKG EKG Interpretation Date/Time:  Monday August 26 2022 08:39:36 EDT Ventricular Rate:  116 PR Interval:  118 QRS Duration:  89 QT Interval:  394 QTC Calculation: 548 R Axis:   84  Text Interpretation: Sinus tachycardia Multiple premature complexes, vent & supraven Prolonged QT interval Confirmed by Kristine Royal [phone]) on 08/26/2022 9:45:13 AM  Radiology IR Paracentesis  Result Date: 08/26/2022 INDICATION: 57 year old male. History of alcohol abuse with chronic pancreatitis presented to ED with abdominal pain. Patient presents for therapeutic and diagnostic paracentesis EXAM: ULTRASOUND GUIDED THERAPEUTIC AND DIAGNOSTIC PARACENTESIS MEDICATIONS: Lidocaine 1% 10 mL COMPLICATIONS: None immediate. PROCEDURE: Informed written consent was obtained from the patient after a discussion of the risks, benefits and alternatives to treatment. A timeout was performed prior to the initiation of the procedure. Initial ultrasound scanning demonstrates a moderate amount of ascites within the right lower abdominal quadrant. The right lower abdomen was prepped and draped in the usual sterile fashion. 1% lidocaine was used for local anesthesia. Following this, a 19 gauge, 7-cm, Yueh catheter was introduced. An ultrasound image was saved  for documentation purposes. The paracentesis was performed. The catheter was removed and a dressing was applied. The patient tolerated the procedure well without immediate post procedural complication. FINDINGS: A total of approximately 3.1 L of serosanguineous fluid was removed. Samples were sent to the laboratory as requested by the clinical team. IMPRESSION: Successful ultrasound-guided paracentesis yielding 3.1 liters of peritoneal fluid. Performed by: Anders Grant, NP Electronically Signed   By: Marliss Coots M.D.   On: 08/26/2022 15:28   CT ABDOMEN PELVIS W CONTRAST  Result Date: 08/26/2022 CLINICAL DATA:  Acute abdominal pain EXAM: CT ABDOMEN AND PELVIS WITH CONTRAST TECHNIQUE: Multidetector CT imaging of the abdomen and pelvis was performed using the standard protocol following bolus administration of intravenous contrast. RADIATION DOSE REDUCTION: This exam was performed according to the departmental dose-optimization program which includes automated exposure control, adjustment of the mA and/or kV according to patient size and/or use of iterative reconstruction technique. CONTRAST:  75mL OMNIPAQUE IOHEXOL 350 MG/ML SOLN COMPARISON:  CT 08/21/2022 and older FINDINGS: Lower chest: Coronary artery calcifications are seen. Please correlate for other coronary risk factors. Decreasing pleural effusions and adjacent opacities with some residual. Recommend continued follow-up. Hepatobiliary: Nodular contours of the diffusely fatty liver. Please correlate for history of chronic liver disease. Gallbladder is nondilated. Portal vein is patent. Pancreas: Preserved pancreatic parenchyma. There are some calcifications in the pancreatic head near the course of the pancreatic duct, unchanged from previous. Spleen: Normal in size without focal abnormality. Adrenals/Urinary Tract: Adrenal glands are preserved. Kidneys are unremarkable. No enhancing mass or collecting system dilatation. Normal caliber ureters down  to the bladder. Bladder has a preserved contour. Bladder is underdistended. Stomach/Bowel: No oral contrast. Large bowel has a normal course and caliber with scattered stool. Stomach is underdistended. Small bowel is also nondilated. Vascular/Lymphatic: Aortic atherosclerosis. No enlarged abdominal or pelvic lymph nodes. Reproductive: Heterogeneous prostate with some mass effect along the base of the bladder. Please correlate for BPH changes in the patient's PSA. Other: Moderate diffuse ascites once again identified.  The amount of ascites is increased from the prior CT scan. Small fat containing umbilical hernia with some mesh. No free air. Musculoskeletal: Scattered degenerative changes of the spine and pelvis. There are bridging osteophytes along the sacroiliac joints. Trace retrolisthesis of L5 on S1 with some disc bulging and stenosis. IMPRESSION: Increasing ascites. Nodular fatty liver. Stable areas of pancreatic calcification. Decreasing pleural effusions and adjacent opacities with some residual. Recommend continued follow-up Electronically Signed   By: Karen Kays M.D.   On: 08/26/2022 11:09   DG Chest 2 View  Result Date: 08/26/2022 CLINICAL DATA:  Epigastric pain EXAM: CHEST - 2 VIEW COMPARISON:  Previous studies including the examination of 08/17/2022 FINDINGS: Cardiac size is within normal limits. Low lung volumes. Linear densities are seen in the lower lung fields. There are no signs of alveolar pulmonary edema or focal pulmonary consolidation. There is minimal blunting of lateral CP angles. There is no pneumothorax. IMPRESSION: There are increased markings in the lower lung fields, more so on the right side suggesting atelectasis. There is blunting of lateral CP angles suggesting small bilateral effusions. Electronically Signed   By: Ernie Avena M.D.   On: 08/26/2022 10:35    Procedures .Critical Care  Performed by: Smitty Knudsen, PA-C Authorized by: Smitty Knudsen, PA-C    Critical care provider statement:    Critical care time (minutes):  35   Critical care start time:  08/26/2022 10:00 AM   Critical care end time:  08/26/2022 10:35 AM   Critical care time was exclusive of:  Separately billable procedures and treating other patients   Critical care was necessary to treat or prevent imminent or life-threatening deterioration of the following conditions:  Sepsis   Critical care was time spent personally by me on the following activities:  Blood draw for specimens, development of treatment plan with patient or surrogate, discussions with consultants, examination of patient, obtaining history from patient or surrogate, ordering and performing treatments and interventions, ordering and review of laboratory studies and ordering and review of radiographic studies   I assumed direction of critical care for this patient from another provider in my specialty: no     Care discussed with: admitting provider      Medications Ordered in ED Medications  ceFEPIme (MAXIPIME) 2 g in sodium chloride 0.9 % 100 mL IVPB (has no administration in time range)  LORazepam (ATIVAN) tablet 1-4 mg (has no administration in time range)    Or  LORazepam (ATIVAN) injection 1-4 mg (has no administration in time range)  lidocaine (PF) (XYLOCAINE) 1 % injection 10 mL (10 mLs Intradermal Not Given 08/26/22 1521)  lipase/protease/amylase (CREON) capsule 12,000 Units (has no administration in time range)  carvedilol (COREG) tablet 3.125 mg (has no administration in time range)  hydrOXYzine (ATARAX) tablet 25 mg (has no administration in time range)  ondansetron (ZOFRAN) tablet 8 mg (has no administration in time range)  pantoprazole (PROTONIX) EC tablet 40 mg (has no administration in time range)  sucralfate (CARAFATE) tablet 1 g (has no administration in time range)  folic acid (FOLVITE) tablet 1 mg (has no administration in time range)  multivitamin with minerals tablet 1 tablet (has no  administration in time range)  thiamine (VITAMIN B1) tablet 100 mg (has no administration in time range)  enoxaparin (LOVENOX) injection 40 mg (has no administration in time range)  ketorolac (TORADOL) 30 MG/ML injection 30 mg (has no administration in time range)  lactated ringers bolus 1,000 mL (0 mLs Intravenous  Stopped 08/26/22 1006)  ondansetron (ZOFRAN) injection 4 mg (4 mg Intravenous Given 08/26/22 0856)  HYDROmorphone (DILAUDID) injection 1 mg (1 mg Intravenous Given 08/26/22 0856)  ceFEPIme (MAXIPIME) 2 g in sodium chloride 0.9 % 100 mL IVPB (0 g Intravenous Stopped 08/26/22 1051)  metroNIDAZOLE (FLAGYL) IVPB 500 mg (0 mg Intravenous Stopped 08/26/22 1213)  iohexol (OMNIPAQUE) 350 MG/ML injection 75 mL (75 mLs Intravenous Contrast Given 08/26/22 1047)  magnesium sulfate IVPB 2 g 50 mL (0 g Intravenous Stopped 08/26/22 1254)  potassium chloride SA (KLOR-CON M) CR tablet 40 mEq (40 mEq Oral Given 08/26/22 1146)  LORazepam (ATIVAN) injection 1 mg (1 mg Intravenous Given 08/26/22 1516)  HYDROmorphone (DILAUDID) injection 1 mg (1 mg Intravenous Given 08/26/22 1519)    ED Course/ Medical Decision Making/ A&P                           Medical Decision Making Amount and/or Complexity of Data Reviewed Labs: ordered. Radiology: ordered. ECG/medicine tests: ordered.  Risk Prescription drug management. Decision regarding hospitalization.   This patient presents to the ED for concern of abdominal pain.  Differential diagnosis includes bowel obstruction, pancreatitis, cholecystitis, appendicitis, pyelonephritis, nephrolithiasis   Lab Tests:  I Ordered, and personally interpreted labs.  The pertinent results include: Significant leukocytosis of 17.1 worsening compared to most recent labs, anemia with drops in RBC, hemoglobin, hematocrit, CMP with mild hyponatremia and hypokalemia as well as anion gap acidosis, magnesium down to 1.1, lipase elevated at 445,   Imaging Studies ordered:  I ordered imaging  studies including CT abdomen pelvis I independently visualized and interpreted imaging which showed increasing ascites, fatty liver, pancreatic calcification I agree with the radiologist interpretation   Medicines ordered and prescription drug management:  I ordered medication including LR, Zofran, Dilaudid, potassium, magnesium, Flagyl, cefepime for dehydration, nausea, pain, hypokalemia, hypomagnesemia, code sepsis for abdominal pathology Reevaluation of the patient after these medicines showed that the patient improved I have reviewed the patients home medicines and have made adjustments as needed   Problem List / ED Course:  Patient presents to the emergency department complaints of abdominal pain.  Past history significant for pancreatitis and alcohol use disorder.  Reports that he has been experiencing abdominal pain for the last 5 days or so and began drinking alcohol as he thought this would help his pain.  Was most recently admitted to the hospital on 08/17/2022 for acute pancreatitis.  Has previously been admitted for similar concerns.  Patient is currently meeting SIRS criteria with an elevated heart rate at 115 and respiratory rate at 23.  No elevated temperature at this time and pending labs for determination of an elevated white blood count.  Consider potentially initiating code sepsis if patient spikes a fever or white count returns elevated or patient's vitals remain abnormal even after administration of fluids and pain medication.  Ordered CBC, CMP, UA, lactic acid, lipase, blood cultures.  Also started treatment with LR, Zofran, Dilaudid.  Will reassess patient shortly once results are available. In combination with tachycardia and initial presentation tachypnea, patient was found to have an elevated white blood count at 17.1.  Will initiate code sepsis for this patient. Spoke with Dr. Chipper Herb, hospitalist, who has graciously accepted patient for admission.   Final Clinical  Impression(s) / ED Diagnoses Final diagnoses:  Acute on chronic pancreatitis (HCC)  SIRS (systemic inflammatory response syndrome) (HCC)  Other ascites    Rx / DC Orders ED Discharge  Orders     None         Salomon Mast 08/26/22 1546    Wynetta Fines, MD 08/26/22 [phone]

## 2022-08-27 DIAGNOSIS — Z5900 Homelessness unspecified: Secondary | ICD-10-CM | POA: Diagnosis not present

## 2022-08-27 DIAGNOSIS — Z1152 Encounter for screening for COVID-19: Secondary | ICD-10-CM | POA: Diagnosis not present

## 2022-08-27 DIAGNOSIS — D500 Iron deficiency anemia secondary to blood loss (chronic): Secondary | ICD-10-CM | POA: Diagnosis not present

## 2022-08-27 DIAGNOSIS — K859 Acute pancreatitis without necrosis or infection, unspecified: Secondary | ICD-10-CM | POA: Diagnosis not present

## 2022-08-27 DIAGNOSIS — R6520 Severe sepsis without septic shock: Secondary | ICD-10-CM

## 2022-08-27 DIAGNOSIS — F1090 Alcohol use, unspecified, uncomplicated: Secondary | ICD-10-CM | POA: Diagnosis not present

## 2022-08-27 DIAGNOSIS — K5780 Diverticulitis of intestine, part unspecified, with perforation and abscess without bleeding: Secondary | ICD-10-CM | POA: Diagnosis present

## 2022-08-27 DIAGNOSIS — A4181 Sepsis due to Enterococcus: Secondary | ICD-10-CM | POA: Diagnosis present

## 2022-08-27 DIAGNOSIS — A0471 Enterocolitis due to Clostridium difficile, recurrent: Secondary | ICD-10-CM | POA: Diagnosis not present

## 2022-08-27 DIAGNOSIS — K659 Peritonitis, unspecified: Secondary | ICD-10-CM | POA: Diagnosis not present

## 2022-08-27 DIAGNOSIS — K5792 Diverticulitis of intestine, part unspecified, without perforation or abscess without bleeding: Secondary | ICD-10-CM | POA: Diagnosis present

## 2022-08-27 DIAGNOSIS — E785 Hyperlipidemia, unspecified: Secondary | ICD-10-CM | POA: Diagnosis present

## 2022-08-27 DIAGNOSIS — D638 Anemia in other chronic diseases classified elsewhere: Secondary | ICD-10-CM | POA: Diagnosis present

## 2022-08-27 DIAGNOSIS — F1020 Alcohol dependence, uncomplicated: Secondary | ICD-10-CM | POA: Diagnosis present

## 2022-08-27 DIAGNOSIS — Z1621 Resistance to vancomycin: Secondary | ICD-10-CM | POA: Diagnosis present

## 2022-08-27 DIAGNOSIS — K8689 Other specified diseases of pancreas: Secondary | ICD-10-CM | POA: Diagnosis not present

## 2022-08-27 DIAGNOSIS — K852 Alcohol induced acute pancreatitis without necrosis or infection: Secondary | ICD-10-CM | POA: Diagnosis not present

## 2022-08-27 DIAGNOSIS — K651 Peritoneal abscess: Secondary | ICD-10-CM | POA: Diagnosis not present

## 2022-08-27 DIAGNOSIS — E43 Unspecified severe protein-calorie malnutrition: Secondary | ICD-10-CM | POA: Diagnosis not present

## 2022-08-27 DIAGNOSIS — R6510 Systemic inflammatory response syndrome (SIRS) of non-infectious origin without acute organ dysfunction: Secondary | ICD-10-CM | POA: Diagnosis not present

## 2022-08-27 DIAGNOSIS — R64 Cachexia: Secondary | ICD-10-CM | POA: Diagnosis present

## 2022-08-27 DIAGNOSIS — K860 Alcohol-induced chronic pancreatitis: Secondary | ICD-10-CM | POA: Diagnosis not present

## 2022-08-27 DIAGNOSIS — K7031 Alcoholic cirrhosis of liver with ascites: Secondary | ICD-10-CM | POA: Diagnosis present

## 2022-08-27 DIAGNOSIS — N179 Acute kidney failure, unspecified: Secondary | ICD-10-CM | POA: Diagnosis not present

## 2022-08-27 DIAGNOSIS — J9601 Acute respiratory failure with hypoxia: Secondary | ICD-10-CM | POA: Diagnosis not present

## 2022-08-27 DIAGNOSIS — E871 Hypo-osmolality and hyponatremia: Secondary | ICD-10-CM | POA: Diagnosis not present

## 2022-08-27 DIAGNOSIS — K709 Alcoholic liver disease, unspecified: Secondary | ICD-10-CM | POA: Diagnosis not present

## 2022-08-27 DIAGNOSIS — A419 Sepsis, unspecified organism: Secondary | ICD-10-CM | POA: Diagnosis not present

## 2022-08-27 DIAGNOSIS — E8809 Other disorders of plasma-protein metabolism, not elsewhere classified: Secondary | ICD-10-CM | POA: Diagnosis present

## 2022-08-27 DIAGNOSIS — B999 Unspecified infectious disease: Secondary | ICD-10-CM | POA: Diagnosis not present

## 2022-08-27 DIAGNOSIS — R188 Other ascites: Secondary | ICD-10-CM | POA: Diagnosis not present

## 2022-08-27 DIAGNOSIS — K863 Pseudocyst of pancreas: Secondary | ICD-10-CM | POA: Diagnosis present

## 2022-08-27 DIAGNOSIS — I509 Heart failure, unspecified: Secondary | ICD-10-CM | POA: Diagnosis not present

## 2022-08-27 DIAGNOSIS — K652 Spontaneous bacterial peritonitis: Secondary | ICD-10-CM | POA: Diagnosis not present

## 2022-08-27 DIAGNOSIS — A0472 Enterocolitis due to Clostridium difficile, not specified as recurrent: Secondary | ICD-10-CM | POA: Diagnosis not present

## 2022-08-27 DIAGNOSIS — G928 Other toxic encephalopathy: Secondary | ICD-10-CM | POA: Diagnosis present

## 2022-08-27 DIAGNOSIS — I10 Essential (primary) hypertension: Secondary | ICD-10-CM | POA: Diagnosis present

## 2022-08-27 DIAGNOSIS — K861 Other chronic pancreatitis: Secondary | ICD-10-CM | POA: Diagnosis not present

## 2022-08-27 LAB — MAGNESIUM: Magnesium: 1.4 mg/dL — ABNORMAL LOW (ref 1.7–2.4)

## 2022-08-27 LAB — URINALYSIS, ROUTINE W REFLEX MICROSCOPIC
Glucose, UA: NEGATIVE mg/dL
Hgb urine dipstick: NEGATIVE
Ketones, ur: 5 mg/dL — AB
Nitrite: NEGATIVE
Protein, ur: 30 mg/dL — AB
Specific Gravity, Urine: 1.041 — ABNORMAL HIGH (ref 1.005–1.030)
pH: 5 (ref 5.0–8.0)

## 2022-08-27 LAB — BASIC METABOLIC PANEL
Anion gap: 10 (ref 5–15)
BUN: 13 mg/dL (ref 6–20)
CO2: 23 mmol/L (ref 22–32)
Calcium: 7.3 mg/dL — ABNORMAL LOW (ref 8.9–10.3)
Chloride: 95 mmol/L — ABNORMAL LOW (ref 98–111)
Creatinine, Ser: 1.13 mg/dL (ref 0.61–1.24)
GFR, Estimated: >60 mL/min (ref >60)
Glucose, Bld: 132 mg/dL — ABNORMAL HIGH (ref 70–99)
Potassium: 3 mmol/L — ABNORMAL LOW (ref 3.5–5.1)
Sodium: 128 mmol/L — ABNORMAL LOW (ref 135–145)

## 2022-08-27 LAB — CBC
HCT: 28.4 % — ABNORMAL LOW (ref 39.0–52.0)
Hemoglobin: 9.2 g/dL — ABNORMAL LOW (ref 13.0–17.0)
MCH: 28.6 pg (ref 26.0–34.0)
MCHC: 32.4 g/dL (ref 30.0–36.0)
MCV: 88.2 fL (ref 80.0–100.0)
Platelets: 535 10*3/uL — ABNORMAL HIGH (ref 150–400)
RBC: 3.22 MIL/uL — ABNORMAL LOW (ref 4.22–5.81)
RDW: 21.6 % — ABNORMAL HIGH (ref 11.5–15.5)
WBC: 13.9 10*3/uL — ABNORMAL HIGH (ref 4.0–10.5)
nRBC: 0 % (ref 0.0–0.2)

## 2022-08-27 LAB — LIPASE, BLOOD: Lipase: 197 U/L — ABNORMAL HIGH (ref 11–51)

## 2022-08-27 LAB — PATHOLOGIST SMEAR REVIEW

## 2022-08-27 MED ORDER — OXYCODONE HCL 5 MG PO TABS
5.0000 mg | ORAL_TABLET | ORAL | Status: DC | PRN
  Administered 2022-08-27 – 2022-09-18 (×78): 5 mg via ORAL
  Filled 2022-08-27 (×78): qty 1

## 2022-08-27 MED ORDER — ONDANSETRON HCL 4 MG/2ML IJ SOLN
4.0000 mg | Freq: Four times a day (QID) | INTRAMUSCULAR | Status: DC | PRN
  Administered 2022-08-31: 4 mg via INTRAVENOUS
  Filled 2022-08-27: qty 2

## 2022-08-27 MED ORDER — POTASSIUM CHLORIDE CRYS ER 20 MEQ PO TBCR
40.0000 meq | EXTENDED_RELEASE_TABLET | ORAL | Status: AC
  Administered 2022-08-27 (×2): 40 meq via ORAL
  Filled 2022-08-27 (×2): qty 2

## 2022-08-27 MED ORDER — KETOROLAC TROMETHAMINE 30 MG/ML IJ SOLN
30.0000 mg | Freq: Four times a day (QID) | INTRAMUSCULAR | Status: AC
  Administered 2022-08-27 – 2022-09-01 (×19): 30 mg via INTRAVENOUS
  Filled 2022-08-27 (×20): qty 1

## 2022-08-27 MED ORDER — MAGNESIUM SULFATE 2 GM/50ML IV SOLN
2.0000 g | Freq: Once | INTRAVENOUS | Status: AC
  Administered 2022-08-27: 2 g via INTRAVENOUS
  Filled 2022-08-27: qty 50

## 2022-08-27 MED ORDER — GABAPENTIN 100 MG PO CAPS
100.0000 mg | ORAL_CAPSULE | Freq: Three times a day (TID) | ORAL | Status: DC
  Administered 2022-08-27 – 2022-09-09 (×39): 100 mg via ORAL
  Filled 2022-08-27 (×39): qty 1

## 2022-08-27 MED ORDER — LACTATED RINGERS IV SOLN: INTRAVENOUS | Status: DC

## 2022-08-27 MED ORDER — HYDROMORPHONE HCL 1 MG/ML IJ SOLN
0.5000 mg | INTRAMUSCULAR | Status: DC | PRN
  Administered 2022-08-27 – 2022-08-28 (×4): 0.5 mg via INTRAVENOUS
  Filled 2022-08-27 (×4): qty 0.5

## 2022-08-27 NOTE — Progress Notes (Signed)
[facility] Progress Note Patient: Samuel Blevins ZOX:[id number] DOB: June 10, 1965 DOA: 08/26/2022  DOS: the patient was seen and examined on 08/27/2022  Brief hospital course: PMH of HTN, recurrent pancreatitis, recurrent alcohol abuse present to the hospital with complaints of abdominal pain nausea vomiting and diarrhea.  Found to have pancreatitis again.  Also concern for infection. Assessment and Plan: Recurrent alcohol induced pancreatitis. CT abdomen showed ascites.  Stable pancreatic calcification.  Lipase level elevated at therefore meeting criteria for pancreatitis with abdominal pain. Patient was allowed diet but reports that he still has ongoing pain and therefore will keep NPO. Treat with IV fluids. Continue pain control.  Medication adjusted.  Nausea and vomiting. Currently appears to have resolved. Treated with as needed Zofran.  Concern for sepsis. Meeting SIRS greater than of admission with tachycardia and leukocytosis. Concern for infection was in the abdomen. Currently on Cefepime. Will monitor cultures and follow-up on results.  Alcohol abuse. On CIWA protocol. Will monitor.  Alcoholic cirrhosis. Ascites. 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  Neutrophils 77 only. Cultures currently pending.  Will monitor.   Subjective: Abdominal pain present.  No nausea or vomiting.  Not passing gas.  Physical Exam: General: in moderate distress, No Rash Cardiovascular: S1 and S2 Present, No Murmur Respiratory: Good respiratory effort, Bilateral Air entry present. No Crackles, No wheezes Abdomen: Bowel Sound present, diffuse tenderness Extremities: No edema Neuro: Alert and oriented x3, no new focal deficit  Data Reviewed: I have Reviewed nursing notes, Vitals, and Lab results. Since last encounter, pertinent lab results CBC and BMP   . I have ordered test including CBC and CMP  . I have ordered imaging x-ray abdomen  .   Disposition: Status is:  Inpatient Remains inpatient appropriate because: Awaiting culture clearance and pain improvement.  enoxaparin (LOVENOX) injection 40 mg Start: 08/26/22 1700   Family Communication: No one at bedside Level of care: Telemetry Medical   Vitals:   08/27/22 0118 08/27/22 0429 08/27/22 0724 08/27/22 1527  BP: 121/82 116/79 127/82 118/75  Pulse: (!) 107 98 (!) 102 95  Resp: 16 16 18 18   Temp: 98.2 F (36.8 C) 98.3 F (36.8 C) 97.6 F (36.4 C) 98.4 F (36.9 C)  TempSrc: Oral  Oral Oral  SpO2: 100% 100% 100% 100%  Weight:      Height:         Author: Lynden Oxford, MD 08/27/2022 7:23 PM  Please look on [email or web address] to find out who is on call.

## 2022-08-27 NOTE — Hospital Course (Signed)
Brief hospital course: PMH of HTN, recurrent pancreatitis, recurrent alcohol abuse present to the hospital with complaints of abdominal pain nausea vomiting and diarrhea.  Found to have pancreatitis again.  Also concern for infection. Assessment and Plan: Recurrent alcohol induced pancreatitis. CT abdomen showed ascites.  Stable pancreatic calcification.  Lipase level elevated at therefore meeting criteria for pancreatitis with abdominal pain. Patient was allowed diet but reports that he still has ongoing pain and therefore will keep NPO. Treat with IV fluids. Continue pain control.  Medication adjusted.  Nausea and vomiting. Currently appears to have resolved. Treated with as needed Zofran.  Concern for sepsis. Meeting SIRS greater than of admission with tachycardia and leukocytosis. Concern for infection was in the abdomen. Currently on Cefepime. Will monitor cultures and follow-up on results.  Alcohol abuse. On CIWA protocol. Will monitor.  Alcoholic cirrhosis. Ascites. 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  Neutrophils 77 only. Cultures currently pending.  Will monitor.

## 2022-08-27 NOTE — TOC Initial Note (Signed)
Transition of Care [facility]) - Initial/Assessment Note    Patient Details  Name: Samuel Blevins MRN: [id number] Date of Birth: 1965-08-02  Transition of Care [facility]) CM/SW Contact:    Samuel Blevins, Samuel Blevins Phone Number: 08/27/2022, 4:51 PM  Clinical Narrative:                  CSW met with pt at bedside. He said he wanted to go to inpatient rehab at discharge. He says he "drinks every day, Vodka." CSW provided pt with list of rehabs in the area to contact regarding bed availability. CSW provided SUD education to pt.   CSW also provided a list of [place] houses for pt to conatct regarding alternative options for housing.   Pt said that he "just started getting a check" and has some funds to pay for housing.   Pt may need assistance with transportation at discharge.   TOC will continue to follow.   Expected Discharge Plan: Homeless Shelter Barriers to Discharge: Continued Medical Work up   Patient Goals and CMS Choice            Expected Discharge Plan and Services In-house Referral: Clinical Social Work     Living arrangements for the past 2 months: Homeless, Homeless Shelter                                      Prior Living Arrangements/Services Living arrangements for the past 2 months: Homeless, Homeless Shelter Lives with:: Self                   Activities of Daily Living Home Assistive Devices/Equipment: Environmental consultant (specify type) ADL Screening (condition at time of admission) Patient's cognitive ability adequate to safely complete daily activities?: No Is the patient deaf or have difficulty hearing?: No Does the patient have difficulty seeing, even when wearing glasses/contacts?: No Does the patient have difficulty concentrating, remembering, or making decisions?: No Patient able to express need for assistance with ADLs?: Yes Does the patient have difficulty dressing or bathing?: Yes Independently performs ADLs?: No Communication: Needs  assistance Dressing (OT): Needs assistance Is this a change from baseline?: Pre-admission baseline Grooming: Independent with device (comment) Feeding: Independent Bathing: Needs assistance Is this a change from baseline?: Pre-admission baseline Toileting: Independent with device (comment) In/Out Bed: Needs assistance Is this a change from baseline?: Pre-admission baseline Walks in Home: Independent with device (comment) Does the patient have difficulty walking or climbing stairs?: Yes Weakness of Legs: Both Weakness of Arms/Hands: Both  Permission Sought/Granted                  Emotional Assessment   Attitude/Demeanor/Rapport: Complaining Affect (typically observed): Agitated Orientation: : Oriented to Self, Oriented to Place, Oriented to  Time, Oriented to Situation Alcohol / Substance Use: Alcohol Use Psych Involvement: No (comment)  Admission diagnosis:  Pancreatitis [K85.90] Other ascites [R18.8] SIRS (systemic inflammatory response syndrome) (HCC) [R65.10] Acute on chronic pancreatitis (HCC) [K85.90, K86.1] Sepsis (HCC) [A41.9] Patient Active Problem List   Diagnosis Date Noted   Acute pancreatitis 08/18/2022   Pancreatitis 08/17/2022   SBP (spontaneous bacterial peritonitis) (HCC) 08/17/2022   Hyponatremia 08/17/2022   Urinary tract infection 07/17/2022   Abnormal finding on GI tract imaging 07/13/2022   Abdominal pain, epigastric 07/11/2022   Sepsis (HCC) 06/10/2022   Acute on chronic pancreatitis (HCC) 06/10/2022   Acute kidney injury (nontraumatic) (HCC) 06/10/2022  Fall 04/16/2022   Alcohol withdrawal with inpatient treatment with perceptual disturbance (HCC) 04/13/2022   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   Hypokalemia    Hypomagnesemia    Portal vein thrombosis 08/03/2021   Alcoholic pancreatitis 08/03/2021   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   Anxiety and depression 07/04/2021   Homeless 07/04/2021    Osteoarthritis 07/04/2021   Thrombocytopenia (HCC) 05/09/2021   Pancreatic insufficiency 05/08/2021   Chronic alcohol abuse 05/07/2021   Alcoholic steatohepatitis 12/07/2020   Iron deficiency anemia due to chronic blood loss 12/07/2020   Seizure (HCC) 12/04/2020   Alcohol withdrawal (HCC) 09/16/2020   Chronic pain syndrome 12/28/2019   Pancreatic pseudocyst 10/28/2019   Gastritis and gastroduodenitis    AVM (arteriovenous malformation) of small bowel, acquired    Transaminitis    Alcohol dependence syndrome (HCC) 06/21/2019   Chronic anemia 11/23/2015   Tobacco abuse 03/17/2014   Essential hypertension 03/17/2014   PCP:  Samuel Frisk, MD Pharmacy:   [facility] [address], [place] - [address] [place] [address] Phone: [phone] Fax: [phone]  [place] [facility] [address] [place] [address] Phone: [phone] Fax: [phone]  [facility] [address] [place] [address] Phone: [phone] Fax: [phone]  [facility] [address] [place] [address] Phone: [phone] Fax: [phone]     Social Determinants of Health (SDOH) Social History: SDOH Screenings   Food Insecurity: Food Insecurity Present (08/26/2022)  Housing: Medium Risk (08/26/2022)  Transportation Needs: Unmet Transportation Needs (08/26/2022)  Utilities: Not At Risk (08/26/2022)  Depression (PHQ2-9): High Risk (02/14/2022)  Tobacco Use: High Risk (08/26/2022)   SDOH Interventions:     Readmission Risk Interventions     No data to display

## 2022-08-27 NOTE — Plan of Care (Signed)

## 2022-08-28 DIAGNOSIS — K652 Spontaneous bacterial peritonitis: Secondary | ICD-10-CM | POA: Diagnosis not present

## 2022-08-28 DIAGNOSIS — F1090 Alcohol use, unspecified, uncomplicated: Secondary | ICD-10-CM

## 2022-08-28 DIAGNOSIS — K861 Other chronic pancreatitis: Secondary | ICD-10-CM

## 2022-08-28 DIAGNOSIS — R188 Other ascites: Secondary | ICD-10-CM

## 2022-08-28 DIAGNOSIS — Z91199 Patient's noncompliance with other medical treatment and regimen due to unspecified reason: Secondary | ICD-10-CM

## 2022-08-28 DIAGNOSIS — D500 Iron deficiency anemia secondary to blood loss (chronic): Secondary | ICD-10-CM | POA: Diagnosis not present

## 2022-08-28 LAB — CBC WITH DIFFERENTIAL/PLATELET
Abs Immature Granulocytes: 0.19 10*3/uL — ABNORMAL HIGH (ref 0.00–0.07)
Basophils Absolute: 0.1 10*3/uL (ref 0.0–0.1)
Basophils Relative: 0 %
Eosinophils Absolute: 0.3 10*3/uL (ref 0.0–0.5)
Eosinophils Relative: 2 %
HCT: 28 % — ABNORMAL LOW (ref 39.0–52.0)
Hemoglobin: 9.4 g/dL — ABNORMAL LOW (ref 13.0–17.0)
Immature Granulocytes: 1 %
Lymphocytes Relative: 6 %
Lymphs Abs: 0.8 10*3/uL (ref 0.7–4.0)
MCH: 30.1 pg (ref 26.0–34.0)
MCHC: 33.6 g/dL (ref 30.0–36.0)
MCV: 89.7 fL (ref 80.0–100.0)
Monocytes Absolute: 1 10*3/uL (ref 0.1–1.0)
Monocytes Relative: 7 %
Neutro Abs: 10.9 10*3/uL — ABNORMAL HIGH (ref 1.7–7.7)
Neutrophils Relative %: 84 %
Platelets: 568 10*3/uL — ABNORMAL HIGH (ref 150–400)
RBC: 3.12 MIL/uL — ABNORMAL LOW (ref 4.22–5.81)
RDW: 21.3 % — ABNORMAL HIGH (ref 11.5–15.5)
WBC: 13.2 10*3/uL — ABNORMAL HIGH (ref 4.0–10.5)
nRBC: 0 % (ref 0.0–0.2)

## 2022-08-28 LAB — COMPREHENSIVE METABOLIC PANEL
ALT: 11 U/L (ref 0–44)
AST: 22 U/L (ref 15–41)
Albumin: <1.5 g/dL — ABNORMAL LOW (ref 3.5–5.0)
Alkaline Phosphatase: 48 U/L (ref 38–126)
Anion gap: 8 (ref 5–15)
BUN: 16 mg/dL (ref 6–20)
CO2: 21 mmol/L — ABNORMAL LOW (ref 22–32)
Calcium: 7.4 mg/dL — ABNORMAL LOW (ref 8.9–10.3)
Chloride: 99 mmol/L (ref 98–111)
Creatinine, Ser: 0.92 mg/dL (ref 0.61–1.24)
GFR, Estimated: >60 mL/min (ref >60)
Glucose, Bld: 103 mg/dL — ABNORMAL HIGH (ref 70–99)
Potassium: 4.2 mmol/L (ref 3.5–5.1)
Sodium: 128 mmol/L — ABNORMAL LOW (ref 135–145)
Total Bilirubin: 1.1 mg/dL (ref 0.3–1.2)
Total Protein: 5.3 g/dL — ABNORMAL LOW (ref 6.5–8.1)

## 2022-08-28 LAB — HEPATITIS C ANTIBODY: HCV Ab: NONREACTIVE

## 2022-08-28 LAB — LACTIC ACID, PLASMA: Lactic Acid, Venous: 0.6 mmol/L (ref 0.5–1.9)

## 2022-08-28 LAB — MAGNESIUM: Magnesium: 1.7 mg/dL (ref 1.7–2.4)

## 2022-08-28 LAB — HEPATITIS B SURFACE ANTIBODY,QUALITATIVE: Hep B S Ab: REACTIVE — AB

## 2022-08-28 LAB — HEPATITIS A ANTIBODY, TOTAL: hep A Total Ab: NONREACTIVE

## 2022-08-28 LAB — LIPASE, BLOOD: Lipase: 384 U/L — ABNORMAL HIGH (ref 11–51)

## 2022-08-28 LAB — HEPATITIS B SURFACE ANTIGEN: Hepatitis B Surface Ag: NONREACTIVE

## 2022-08-28 MED ORDER — TRAZODONE HCL 50 MG PO TABS
50.0000 mg | ORAL_TABLET | Freq: Every evening | ORAL | Status: DC | PRN
  Administered 2022-08-29 – 2022-09-11 (×12): 50 mg via ORAL
  Filled 2022-08-28 (×12): qty 1

## 2022-08-28 MED ORDER — HYDRALAZINE HCL 20 MG/ML IJ SOLN: 10.0000 mg | INTRAMUSCULAR | Status: DC | PRN

## 2022-08-28 MED ORDER — SENNOSIDES-DOCUSATE SODIUM 8.6-50 MG PO TABS: 1.0000 | ORAL_TABLET | Freq: Every evening | ORAL | Status: DC | PRN

## 2022-08-28 MED ORDER — ACETAMINOPHEN 325 MG PO TABS
650.0000 mg | ORAL_TABLET | Freq: Four times a day (QID) | ORAL | Status: DC | PRN
  Administered 2022-08-29 – 2022-09-09 (×9): 650 mg via ORAL
  Filled 2022-08-28 (×9): qty 2

## 2022-08-28 MED ORDER — METOPROLOL TARTRATE 5 MG/5ML IV SOLN
5.0000 mg | INTRAVENOUS | Status: DC | PRN
  Administered 2022-09-06: 5 mg via INTRAVENOUS
  Filled 2022-08-28: qty 5

## 2022-08-28 MED ORDER — IPRATROPIUM-ALBUTEROL 0.5-2.5 (3) MG/3ML IN SOLN: 3.0000 mL | RESPIRATORY_TRACT | Status: DC | PRN

## 2022-08-28 MED ORDER — ENSURE ENLIVE PO LIQD
237.0000 mL | Freq: Two times a day (BID) | ORAL | Status: DC
  Administered 2022-08-29 – 2022-09-18 (×28): 237 mL via ORAL

## 2022-08-28 MED ORDER — ALBUMIN HUMAN 25 % IV SOLN
25.0000 g | Freq: Four times a day (QID) | INTRAVENOUS | Status: AC
  Administered 2022-08-28 – 2022-08-30 (×8): 25 g via INTRAVENOUS
  Filled 2022-08-28 (×8): qty 100

## 2022-08-28 MED ORDER — GUAIFENESIN 100 MG/5ML PO LIQD: 5.0000 mL | ORAL | Status: DC | PRN

## 2022-08-28 NOTE — Consult Note (Signed)
Consultation Note   Referring Provider:  [facility] PCP: [name], MD Primary Gastroenterologist: Doristine Locks, MD        Reason for consultation: SBP  DOA: 08/26/2022         Hospital Day: 3   Assessment and Plan  Patient is a 57 y.o. year old male whose past medical history includes but is not necessarily limited to chronic pancreatitis, etoh abuse, IDA, intestinal angioectasias , H.pylori gastritis, spontaneous bacterial peritonitis vrs secondary peritonitis since cirrhosis not previously proven however imaging this admission does show "nodular" contours of fatty liver. Now readmitted with persistent / recurrent SBP  -Afebrile, WBC 17K. On Maxipime 2 gms Q 8 hours. On day # 3 of Iantibiotics, may need repeat diagnostic tap tomorrow..   -Fluid cultures pending, negative at day 2.  -He has refused IV albumin. Thankfully kidney function is normal .    History of Etoh abuse / probably cirrhosis complicated by SBP -Check for chronic HBV, HCV -No history of esophageal varices.  -No evidence for hepatic encephalopathy.  -Obtain AFP. No focal liver lesions on CT scan  -Will need long term antibiotics upon discharge for secondary prophylaxis.  -Needs total Etoh cessation -2 gram sodium restricted diet when diet given -Will need diuretics at some point.  - ? Stop beta blocker given SBP  Chronic  anemia -Hgb stable ( even after IVF)  Etoh abuse, last drink was 3 days ago  On CIWA  Chronic pancreatitis.  No acute changes of pancreas on CT scan but lipase 384  Can have clear liquids Resume Creon when eating  Medical non-compliance History of leaving AMA   History of Present Illness  Patient has had several ED visits / admission for acute on chronic pancreatitis / anemia. He has had multiple previous EGD's. In late June we saw him in the hospital for abdominal pain  / pancreatitis and new ascites. Underwent LVP not  felt to be 2/2 portal HTN. He had SBP. Treated with IV albumin, antibiotics. Repeat paracentesis at day 3 was still concerning for peritonitis. Antibiotics broadened. Repeat CT AP ordered to better understand secondary peritonitis but it was non-diagnostic. PATIENT LEFT AMA. Appears he wasn't discharged with Rx for antibiotics.   Interval History :  Patient admitted 7/1 with recurrent abdominal pain, nausea / vomiting. Pain started on Monday. The abdominal pain is generalized and constant. Not eating much but was vomiting fluids he consumed.  He continues to drink etoh, last drink was 3 days ago.    In ED he was tachycardic. Lactic acid normal. WBC 17K Lipase in 400's. He was fluid resusitated and started on cefepime to treat possible SBP. CT scan without acute findings, + moderate ascites and nodular fatty liver. Underwent LVP of 3 L yesterday and fluid c/w SBP. Culture negative so far.   Ascitic fluid :  3592 PMN with 77% Neutrophils   Labs and Imaging: Recent Labs    08/26/22 0847 08/27/22 0431 08/28/22 0442  WBC 17.1* 13.9* 13.2*  HGB 11.6* 9.2* 9.4*  HCT 36.9* 28.4* 28.0*  PLT 558* 535* 568*   Recent Labs    08/26/22 0847 08/27/22 0752 08/28/22 0442  NA 132* 128* 128*  K 3.2* 3.0* 4.2  CL 98 95* 99  CO2 16* 23 21*  GLUCOSE 103* 132* 103*  BUN 8 13 16   CREATININE 1.11 1.13 0.92  CALCIUM 7.6* 7.3* 7.4*   Recent Labs    08/28/22 0442  PROT 5.3*  ALBUMIN <1.5*  AST 22  ALT 11  ALKPHOS 48  BILITOT 1.1   No results for input(s): "HEPBSAG", "HCVAB", "HEPAIGM", "HEPBIGM" in the last 72 hours. Recent Labs    08/26/22 1002  LABPROT 15.3*  INR 1.2    Previous GI Evaluation:   EGD 08/04/2021 with Dr. Tomasa Rand for hematemesis: Normal esophagus, 3 cm hiatal hernia, normal stomach, single duodenal polyp (biopsied as reactive duodenal mucosa with extensive gastric metaplasia compatible with peptic duodenitis).  Stomach biopsy negative for dysplasia/H. pylori.  Showed  mild chronic gastritis and focal intestinal metaplasia   EGD 12/12/2020 with Dr. Chales Abrahams for suspected UGIB: Mild gastritis, single duodenal erosion without bleeding. Negative h pylori   EGD 06/23/2019 with Dr. Barron Alvine for IDA, hematemesis, melena: Normal esophagus, gastritis, 6 angioectasias in duodenum treated with APC, mucosal changes in duodenum.  Biopsies showed mildly active H. pylori gastritis. Treated with quadruple therapy.   Principal Problem:   Sepsis (HCC) Active Problems:   Alcohol dependence syndrome (HCC)   Acute on chronic pancreatitis (HCC)   Pancreatitis   Acute pancreatitis     Past Medical History:  Diagnosis Date   Alcohol withdrawal syndrome with complication (HCC)    Alcoholism (HCC)    Elevated AST (SGOT)    Gastrointestinal hemorrhage    Homeless    Hypertension    Pancreatic insufficiency    takes Creon   Symptomatic anemia 11/23/2015   Thrombocytopenia (HCC) 05/09/2021    Past Surgical History:  Procedure Laterality Date   BIOPSY  06/23/2019   Procedure: BIOPSY;  Surgeon: Shellia Cleverly, DO;  Location: [facility] ENDOSCOPY;  Service: Gastroenterology;;   BIOPSY  12/12/2020   Procedure: BIOPSY;  Surgeon: Lynann Bologna, MD;  Location: [facility] ENDOSCOPY;  Service: Endoscopy;;   BIOPSY  08/04/2021   Procedure: BIOPSY;  Surgeon: Jenel Lucks, MD;  Location: [facility] ENDOSCOPY;  Service: Gastroenterology;;   ESOPHAGOGASTRODUODENOSCOPY N/A 08/04/2021   Procedure: ESOPHAGOGASTRODUODENOSCOPY (EGD);  Surgeon: Jenel Lucks, MD;  Location: [facility] ENDOSCOPY;  Service: Gastroenterology;  Laterality: N/A;   ESOPHAGOGASTRODUODENOSCOPY (EGD) WITH PROPOFOL N/A 06/23/2019   Procedure: ESOPHAGOGASTRODUODENOSCOPY (EGD) WITH PROPOFOL;  Surgeon: Shellia Cleverly, DO;  Location: [facility] ENDOSCOPY;  Service: Gastroenterology;  Laterality: N/A;   ESOPHAGOGASTRODUODENOSCOPY (EGD) WITH PROPOFOL N/A 12/12/2020   Procedure: ESOPHAGOGASTRODUODENOSCOPY (EGD) WITH PROPOFOL;  Surgeon: Lynann Bologna, MD;  Location: [facility] ENDOSCOPY;  Service: Endoscopy;  Laterality: N/A;   HOT HEMOSTASIS N/A 06/23/2019   Procedure: HOT HEMOSTASIS (ARGON PLASMA COAGULATION/BICAP);  Surgeon: Shellia Cleverly, DO;  Location: [facility] ENDOSCOPY;  Service: Gastroenterology;  Laterality: N/A;   IR PARACENTESIS  08/21/2022   IR PARACENTESIS  08/26/2022    Family History  Problem Relation Age of Onset   Diabetes Mellitus II Neg Hx    Colon cancer Neg Hx    Stomach cancer Neg Hx    Pancreatic cancer Neg Hx     Prior to Admission medications   Medication Sig Start Date End Date Taking? Authorizing Provider  amLODipine (NORVASC) 5 MG tablet Take 5 mg by mouth daily.   Yes [provider]  carvedilol (COREG) 3.125 MG tablet Take 1 tablet (3.125 mg total) by mouth 2 (two) times daily with a meal. 07/10/22  Yes [name], MD  folic  acid (FOLVITE) 1 MG tablet Take 1 tablet (1 mg total) by mouth daily. 07/14/22  Yes Rolly Salter, MD  hydrOXYzine (ATARAX) 25 MG tablet Take 25 mg by mouth at bedtime as needed for anxiety.   Yes [provider]  Iron, Ferrous Sulfate, 325 (65 Fe) MG TABS Take 1 tablet ( 325 mg) by mouth daily. 05/24/22  Yes [name], MD  lipase/protease/amylase (CREON) 12000-38000 units CPEP capsule Take 1 capsule (12,000 Units total) by mouth 3 (three) times daily before meals. 07/14/22  Yes Rolly Salter, MD  magnesium oxide (MAG-OX) 400 MG tablet Take 1 tablet (400 mg total) by mouth in the morning and at bedtime. 07/14/22  Yes Rolly Salter, MD  Multiple Vitamin (MULTIVITAMIN WITH MINERALS) TABS tablet Take 1 tablet by mouth daily. 07/14/22  Yes Rolly Salter, MD  ondansetron (ZOFRAN) 8 MG tablet Take 8 mg by mouth every 6 (six) hours as needed for nausea or vomiting.   Yes [provider]  pantoprazole (PROTONIX) 40 MG tablet Take 1 tablet (40 mg total) by mouth 2 (two) times daily before a meal. 07/14/22  Yes Rolly Salter, MD  polyethylene glycol  (MIRALAX / GLYCOLAX) 17 g packet Take 17 g by mouth daily as needed for mild constipation or moderate constipation. 07/14/22  Yes Rolly Salter, MD  sucralfate (CARAFATE) 1 g tablet Take 1 g by mouth See admin instructions. Take 1 tablet by mouth 4 times daily with meals and bedtime   Yes [provider]  thiamine (VITAMIN B-1) 100 MG tablet Take 1 tablet (100 mg total) by mouth daily. 07/14/22  Yes Rolly Salter, MD  traMADol (ULTRAM) 50 MG tablet Take 1 tablet (50 mg total) by mouth every 8 (eight) hours as needed for severe pain. 07/17/22  Yes [name], MD  nicotine (NICODERM CQ - DOSED IN MG/24 HOURS) 14 mg/24hr patch Place 1 patch (14 mg total) onto the skin daily. Patient not taking: Reported on 08/21/2022 07/14/22   Rolly Salter, MD    Current Facility-Administered Medications  Medication Dose Route Frequency Provider Last Rate Last Admin   acetaminophen (TYLENOL) tablet 650 mg  650 mg Oral Q6H PRN Amin, Ankit Chirag, MD       acidophilus (RISAQUAD) capsule 2 capsule  2 capsule Oral TID Mikey College T, MD   2 capsule at 08/28/22 0907   carvedilol (COREG) tablet 3.125 mg  3.125 mg Oral BID WC Mikey College T, MD   3.125 mg at 08/28/22 7829   ceFEPIme (MAXIPIME) 2 g in sodium chloride 0.9 % 100 mL IVPB  2 g Intravenous Q8H Daylene Posey, RPH 200 mL/hr at 08/28/22 0912 2 g at 08/28/22 0912   enoxaparin (LOVENOX) injection 40 mg  40 mg Subcutaneous Q24H Mikey College T, MD   40 mg at 08/27/22 1730   folic acid (FOLVITE) tablet 1 mg  1 mg Oral Daily Mikey College T, MD   1 mg at 08/28/22 0906   gabapentin (NEURONTIN) capsule 100 mg  100 mg Oral TID Rolly Salter, MD   100 mg at 08/28/22 0906   guaiFENesin (ROBITUSSIN) 100 MG/5ML liquid 5 mL  5 mL Oral Q4H PRN Amin, Ankit Chirag, MD       hydrALAZINE (APRESOLINE) injection 10 mg  10 mg Intravenous Q4H PRN Amin, Ankit Chirag, MD       HYDROmorphone (DILAUDID) injection 0.5 mg  0.5 mg Intravenous Q3H PRN Rolly Salter, MD  0.5  mg at 08/28/22 0914   hydrOXYzine (ATARAX) tablet 25 mg  25 mg Oral QHS PRN Mikey College T, MD       ipratropium-albuterol (DUONEB) 0.5-2.5 (3) MG/3ML nebulizer solution 3 mL  3 mL Nebulization Q4H PRN Amin, Ankit Chirag, MD       ketorolac (TORADOL) 30 MG/ML injection 30 mg  30 mg Intravenous Q6H Rolly Salter, MD   30 mg at 08/28/22 0534   lidocaine (PF) (XYLOCAINE) 1 % injection 10 mL  10 mL Intradermal Once Doristine Counter, [facility]       lipase/protease/amylase (CREON) capsule 12,000 Units  12,000 Units Oral TID Adonis Brook T, MD   12,000 Units at 08/28/22 1610   LORazepam (ATIVAN) tablet 1-4 mg  1-4 mg Oral Q1H PRN Emeline General, MD       Or   LORazepam (ATIVAN) injection 1-4 mg  1-4 mg Intravenous Q1H PRN Mikey College T, MD       metoprolol tartrate (LOPRESSOR) injection 5 mg  5 mg Intravenous Q4H PRN Amin, Loura Halt, MD       multivitamin with minerals tablet 1 tablet  1 tablet Oral Daily Mikey College T, MD   1 tablet at 08/28/22 0907   ondansetron (ZOFRAN) injection 4 mg  4 mg Intravenous Q6H PRN Rolly Salter, MD       oxyCODONE (Oxy IR/ROXICODONE) immediate release tablet 5 mg  5 mg Oral Q4H PRN Rolly Salter, MD   5 mg at 08/28/22 0230   pantoprazole (PROTONIX) EC tablet 40 mg  40 mg Oral BID AC Mikey College T, MD   40 mg at 08/28/22 9604   senna-docusate (Senokot-S) tablet 1 tablet  1 tablet Oral QHS PRN Amin, Loura Halt, MD       sucralfate (CARAFATE) tablet 1 g  1 g Oral TID PC & HS Mikey College T, MD   1 g at 08/28/22 5409   thiamine (VITAMIN B1) tablet 100 mg  100 mg Oral Daily Mikey College T, MD   100 mg at 08/28/22 8119   traZODone (DESYREL) tablet 50 mg  50 mg Oral QHS PRN Dimple Nanas, MD        Allergies as of 08/26/2022   (No Known Allergies)    Social History   Socioeconomic History   Marital status: Single    Spouse name: Not on file   Number of children: Not on file   Years of education: Not on file   Highest education level: Not on file   Occupational History   Not on file  Tobacco Use   Smoking status: Every Day    Packs/day: 0.50    Years: 30.00    Additional pack years: 0.00    Total pack years: 15.00    Types: Cigarettes   Smokeless tobacco: Never  Vaping Use   Vaping Use: Never used  Substance and Sexual Activity   Alcohol use: Yes    Alcohol/week: 28.0 standard drinks of alcohol    Types: 28 Shots of liquor per week   Drug use: Never   Sexual activity: Not Currently  Other Topics Concern   Not on file  Social History Narrative   Not on file   Social Determinants of Health   Financial Resource Strain: Not on file  Food Insecurity: Food Insecurity Present (08/26/2022)   Hunger Vital Sign    Worried About Running Out of Food in the Last Year: Sometimes true  Ran Out of Food in the Last Year: Sometimes true  Transportation Needs: Unmet Transportation Needs (08/26/2022)   PRAPARE - Administrator, Civil Service (Medical): Yes    Lack of Transportation (Non-Medical): Yes  Physical Activity: Not on file  Stress: Not on file  Social Connections: Not on file  Intimate Partner Violence: Not At Risk (08/26/2022)   Humiliation, Afraid, Rape, and Kick questionnaire    Fear of Current or Ex-Partner: No    Emotionally Abused: No    Physically Abused: No    Sexually Abused: No     Code Status   Code Status: Full Code  Review of Systems: All systems reviewed and negative except where noted in HPI.  Physical Exam: Vital signs in last 24 hours: Temp:  [97.6 F (36.4 C)-98.6 F (37 C)] 97.6 F (36.4 C) (07/03 0757) Pulse Rate:  [94-96] 96 (07/03 0757) Resp:  [16-18] 16 (07/03 0153) BP: (112-118)/(72-84) 115/84 (07/03 0757) SpO2:  [98 %-100 %] 99 % (07/03 0757) Last BM Date : 08/27/22  General:  male in NAD Psych:  Irritable Eyes: Pupils equal Ears:  Normal auditory acuity Nose: No deformity, discharge or lesions Lungs:  Clear to auscultation.  Heart:  Regular rate, regular rhythm.   Abdomen:  Soft, moderately distended, generalized tenderness with voluntary guarding,  active bowel sounds, Rectal :  Deferred Msk: Symmetrical without gross deformities.  Neurologic:  Alert, oriented, grossly normal neurologically   Intake/Output from previous day: 07/02 0701 - 07/03 0700 In: 1294.1 [I.V.:994.1; IV Piggyback:300] Out: 425 [Urine:425] Intake/Output this shift:  No intake/output data recorded.     Willette Cluster, NP-C @  08/28/2022, 11:21 AM

## 2022-08-28 NOTE — Progress Notes (Signed)
Initial Nutrition Assessment  DOCUMENTATION CODES:   Not applicable  INTERVENTION:  - Add Ensure Enlive po BID, each supplement provides 350 kcal and 20 grams of protein.  NUTRITION DIAGNOSIS:   Inadequate oral intake related to inability to eat as evidenced by meal completion < 25%.  GOAL:   Patient will meet greater than or equal to 90% of their needs  MONITOR:   PO intake, Supplement acceptance  REASON FOR ASSESSMENT:   Consult Assessment of nutrition requirement/status  ASSESSMENT:   57 y.o. male admits related to abdominal pain and emesis. PMH includes: alcohol abuse, tobacco abuse, chronic pancreatitis, seizures. Pt is currently receiving medical management related to sepsis.  Meds reviewed: risaquad, folic acid, creon, MVI, thiamine. Labs reviewed: Na low, lipase elevated.   No answer to room phone. Per record, PO intakes 0%. RD will add supplements and continue to closely monitor PO intakes. No significant wt loss per record.   NUTRITION - FOCUSED PHYSICAL EXAM:  Remote assessment.   Diet Order:   Diet Order             Diet 2 gram sodium Room service appropriate? Yes; Fluid consistency: Thin; Fluid restriction: 1200 mL Fluid  Diet effective now                   EDUCATION NEEDS:   Not appropriate for education at this time  Skin:  Skin Assessment: Reviewed RN Assessment  Last BM:  7/2  Height:   Ht Readings from Last 1 Encounters:  08/26/22 5\' 9"  (1.753 m)    Weight:   Wt Readings from Last 1 Encounters:  08/26/22 77.1 kg    Ideal Body Weight:     BMI:  Body mass index is 25.1 kg/m.  Estimated Nutritional Needs:   Kcal:  1930-2300 kcals  Protein:  100-115 gm  Fluid:  </= 1.2 L  Bethann Humble, RD, LDN, CNSC.

## 2022-08-28 NOTE — Progress Notes (Signed)
PROGRESS NOTE    Samuel Blevins  ZOX:[id number] DOB: [birth year] DOA: 08/26/2022 PCP: [name], MD   Brief Narrative:  57 year old with history of recurrent pancreatitis, chronic pancreatic insufficiency, HTN recurrent alcohol use comes to the hospital with complaints of abdominal pain, nausea, vomiting and diarrhea.  Patient was also found to have acute recurrent alcoholic pancreatitis.  Initially patient had left AMA about 4-5 days prior to admission   Assessment & Plan:  Principal Problem:   Sepsis (HCC) Active Problems:   Alcohol dependence syndrome (HCC)   Acute on chronic pancreatitis (HCC)   Pancreatitis   Acute pancreatitis   Recurrent alcohol induced pancreatitis. Nausea vomiting -Secondary to chronic alcohol use.  Overall stable calcification.  Will opt to treat conservatively with supportive care, IV fluids, diet as tolerated.  Pain control.  As needed antiemetics.  PPI  Decompensated liver cirrhosis with ascites Sepsis secondary to spontaneous bacterial peritonitis Hypoalbuminemia -Sepsis evidenced by tachycardia and leukocytosis. -Status post paracentesis 7/1-3.1 L removed.  Concerns of SBP.  Follow culture data, patient was already on antibiotics last week.  On empiric cefepime. [facility] consulted.   Initially chart hide the patient refuses blood products.  I discussed the case with him with RN present with me in the room.  Patient is now with agreeable to except albumin as treatment protocol for his hypoalbuminemia, recurrent ascites and SBP.  Will go ahead and order albumin.  Alcohol abuse - Alcohol withdrawal protocol.    Essential hypertension -Coreg.  IV as needed  GERD/duodenitis -PPI  DVT prophylaxis: Lovenox Code Status: Full code Family Communication:   Status is: Inpatient Continue hospital stay due to significant abdominal discomfort, SBP treatment.       Diet Orders (From admission, onward)     Start     Ordered   08/27/22 1422   Diet NPO time specified Except for: [employer], Sips with Meds  Diet effective now       Comments: May give sips of water in between as well, maximum 30-71mL by mouth every hour.  Question Answer Comment  Except for Ice Chips   Except for Sips with Meds      08/27/22 1422            Subjective: Still having abdominal pain and at times due to pain and discomfort he gets shortness of breath. Patient agreeable for albumin administration for part of his treatment   Examination:  General exam: Appears calm and comfortable  Respiratory system: Clear to auscultation. Respiratory effort normal. Cardiovascular system: S1 & S2 heard, RRR. No JVD, murmurs, rubs, gallops or clicks. No pedal edema. Gastrointestinal system: Abdomen is distended, fluid wave shift.  Tender to palpation.  Positive bowel sounds. Central nervous system: Alert and oriented. No focal neurological deficits. Extremities: Symmetric 5 x 5 power. Skin: No rashes, lesions or ulcers Psychiatry: Judgement and insight appear normal. Mood & affect appropriate.  Objective: Vitals:   08/27/22 0724 08/27/22 1527 08/27/22 1935 08/28/22 0153  BP: 127/82 118/75 112/72 113/78  Pulse: (!) 102 95 94 95  Resp: 18 18 18 16   Temp: 97.6 F (36.4 C) 98.4 F (36.9 C) 98.6 F (37 C) 98 F (36.7 C)  TempSrc: Oral Oral Oral Oral  SpO2: 100% 100% 99% 98%  Weight:      Height:        Intake/Output Summary (Last 24 hours) at 08/28/2022 0751 Last data filed at 08/28/2022 0536 Gross per 24 hour  Intake 1294.11 ml  Output 425 ml  Net 869.11 ml   Filed Weights   08/26/22 0838  Weight: 77.1 kg    Scheduled Meds:  acidophilus  2 capsule Oral TID   carvedilol  3.125 mg Oral BID WC   enoxaparin (LOVENOX) injection  40 mg Subcutaneous Q24H   folic acid  1 mg Oral Daily   gabapentin  100 mg Oral TID   ketorolac  30 mg Intravenous Q6H   lidocaine (PF)  10 mL Intradermal Once   lipase/protease/amylase  12,000 Units Oral TID AC    multivitamin with minerals  1 tablet Oral Daily   pantoprazole  40 mg Oral BID AC   sucralfate  1 g Oral TID PC & HS   thiamine  100 mg Oral Daily   Continuous Infusions:  ceFEPime (MAXIPIME) IV Stopped (08/28/22 0259)   lactated ringers 125 mL/hr at 08/28/22 0344    Nutritional status     Body mass index is 25.1 kg/m.  Data Reviewed:   CBC: Recent Labs  Lab 08/26/22 0847 08/27/22 0431 08/28/22 0442  WBC 17.1* 13.9* 13.2*  NEUTROABS 13.6*  --  10.9*  HGB 11.6* 9.2* 9.4*  HCT 36.9* 28.4* 28.0*  MCV 89.6 88.2 89.7  PLT 558* 535* 568*   Basic Metabolic Panel: Recent Labs  Lab 08/22/22 0059 08/26/22 0847 08/26/22 0850 08/27/22 0431 08/27/22 0752 08/28/22 0442  NA 131* 132*  --   --  128* 128*  K 3.9 3.2*  --   --  3.0* 4.2  CL 100 98  --   --  95* 99  CO2 15* 16*  --   --  23 21*  GLUCOSE 67* 103*  --   --  132* 103*  BUN 6 8  --   --  13 16  CREATININE 0.86 1.11  --   --  1.13 0.92  CALCIUM 7.4* 7.6*  --   --  7.3* 7.4*  MG 1.5*  --  1.1* 1.4*  --  1.7  PHOS 2.3*  --   --   --   --   --    GFR: Estimated Creatinine Clearance: 87.5 mL/min (by C-G formula based on SCr of 0.92 mg/dL). Liver Function Tests: Recent Labs  Lab 08/22/22 0059 08/26/22 0847 08/28/22 0442  AST 47* 30 22  ALT 15 13 11   ALKPHOS 81 69 48  BILITOT 1.3* 0.8 1.1  PROT 5.8* 6.5 5.3*  ALBUMIN 2.0* 2.0* <1.5*   Recent Labs  Lab 08/22/22 0059 08/26/22 1050 08/27/22 0431 08/28/22 0442  LIPASE 145* 445* 197* 384*   No results for input(s): "AMMONIA" in the last 168 hours. Coagulation Profile: Recent Labs  Lab 08/26/22 1002  INR 1.2   Cardiac Enzymes: No results for input(s): "CKTOTAL", "CKMB", "CKMBINDEX", "TROPONINI" in the last 168 hours. BNP (last 3 results) No results for input(s): "PROBNP" in the last 8760 hours. HbA1C: No results for input(s): "HGBA1C" in the last 72 hours. CBG: No results for input(s): "GLUCAP" in the last 168 hours. Lipid Profile: No results for  input(s): "CHOL", "HDL", "LDLCALC", "TRIG", "CHOLHDL", "LDLDIRECT" in the last 72 hours. Thyroid Function Tests: No results for input(s): "TSH", "T4TOTAL", "FREET4", "T3FREE", "THYROIDAB" in the last 72 hours. Anemia Panel: No results for input(s): "VITAMINB12", "FOLATE", "FERRITIN", "TIBC", "IRON", "RETICCTPCT" in the last 72 hours. Sepsis Labs: Recent Labs  Lab 08/26/22 0847 08/26/22 1022 08/28/22 0442  LATICACIDVEN 2.1* 1.4 0.6    Recent Results (from the past 240 hour(s))  Body fluid  culture w Gram Stain     Status: None   Collection Time: 08/18/22 12:00 PM   Specimen: PATH Cytology Peritoneal fluid  Result Value Ref Range Status   Specimen Description PERITONEAL  Final   Special Requests NONE  Final   Gram Stain   Final    RARE WBC PRESENT, PREDOMINANTLY PMN NO ORGANISMS SEEN    Culture   Final    NO GROWTH 3 DAYS Performed at [facility], [address], [place], [place] [address]    Report Status 08/21/2022 FINAL  Final  Anaerobic culture w Gram Stain     Status: None   Collection Time: 08/18/22 12:00 PM   Specimen: Peritoneal Washings  Result Value Ref Range Status   Specimen Description PERITONEAL  Final   Special Requests NONE  Final   Gram Stain   Final    RARE WBC PRESENT, PREDOMINANTLY PMN NO ORGANISMS SEEN    Culture   Final    NO ANAEROBES ISOLATED Performed at [facility], [address], [place], [place] [address]    Report Status 08/23/2022 FINAL  Final  Body fluid culture w Gram Stain     Status: None   Collection Time: 08/21/22  2:53 PM   Specimen: PATH Cytology Peritoneal fluid  Result Value Ref Range Status   Specimen Description PERITONEAL  Final   Special Requests NONE  Final   Gram Stain   Final    FEW WBC PRESENT, PREDOMINANTLY PMN NO ORGANISMS SEEN    Culture   Final    NO GROWTH 3 DAYS Performed at [facility], [address] [place], [place] [address]    Report Status 08/24/2022 FINAL  Final  Blood culture  (routine x 2)     Status: None (Preliminary result)   Collection Time: 08/26/22  8:47 AM   Specimen: BLOOD  Result Value Ref Range Status   Specimen Description BLOOD SITE NOT SPECIFIED  Final   Special Requests   Final    BOTTLES DRAWN AEROBIC AND ANAEROBIC Blood Culture results may not be optimal due to an inadequate volume of blood received in culture bottles   Culture   Final    NO GROWTH 1 DAY Performed at [facility], [address] [place], [place] [address]    Report Status PENDING  Incomplete  Blood culture (routine x 2)     Status: None (Preliminary result)   Collection Time: 08/26/22  8:52 AM   Specimen: BLOOD  Result Value Ref Range Status   Specimen Description BLOOD SITE NOT SPECIFIED  Final   Special Requests   Final    BOTTLES DRAWN AEROBIC AND ANAEROBIC Blood Culture adequate volume   Culture   Final    NO GROWTH 1 DAY Performed at [facility], [address], [place], [place] [address]    Report Status PENDING  Incomplete  Resp panel by RT-PCR (RSV, Flu A&B, Covid) Anterior Nasal Swab     Status: None   Collection Time: 08/26/22  9:52 AM   Specimen: Anterior Nasal Swab  Result Value Ref Range Status   SARS Coronavirus 2 by RT PCR NEGATIVE NEGATIVE Final   Influenza A by PCR NEGATIVE NEGATIVE Final   Influenza B by PCR NEGATIVE NEGATIVE Final    Comment: (NOTE) The Xpert Xpress SARS-CoV-2/FLU/RSV plus assay is intended as an aid in the diagnosis of influenza from Nasopharyngeal swab specimens and should not be used as  a sole basis for treatment. Nasal washings and aspirates are unacceptable for Xpert Xpress SARS-CoV-2/FLU/RSV testing.  Fact Sheet for Patients: BloggerCourse.com  Fact Sheet for Healthcare Providers: SeriousBroker.it  This test is not yet approved or cleared by the [place] FDA and has been authorized for detection and/or diagnosis of SARS-CoV-2 by FDA under an Emergency  Use Authorization (EUA). This EUA will remain in effect (meaning this test can be used) for the duration of the COVID-19 declaration under Section 564(b)(1) of the Act, 21 U.S.C. section 360bbb-3(b)(1), unless the authorization is terminated or revoked.     Resp Syncytial Virus by PCR NEGATIVE NEGATIVE Final    Comment: (NOTE) Fact Sheet for Patients: BloggerCourse.com  Fact Sheet for Healthcare Providers: SeriousBroker.it  This test is not yet approved or cleared by the [place] FDA and has been authorized for detection and/or diagnosis of SARS-CoV-2 by FDA under an Emergency Use Authorization (EUA). This EUA will remain in effect (meaning this test can be used) for the duration of the COVID-19 declaration under Section 564(b)(1) of the Act, 21 U.S.C. section 360bbb-3(b)(1), unless the authorization is terminated or revoked.  Performed at [facility], [address], [place], [place] [address]   Gram stain     Status: None   Collection Time: 08/26/22  2:10 PM   Specimen: Abdomen; Peritoneal Fluid  Result Value Ref Range Status   Specimen Description PERITONEAL  Final   Special Requests NONE  Final   Gram Stain   Final    ABUNDANT WBC PRESENT,BOTH PMN AND MONONUCLEAR NO ORGANISMS SEEN Performed at [facility], [address], [place], [place] [address]    Report Status 08/26/2022 FINAL  Final  Culture, body fluid w Gram Stain-bottle     Status: None (Preliminary result)   Collection Time: 08/26/22  2:10 PM   Specimen: Peritoneal Washings  Result Value Ref Range Status   Specimen Description PERITONEAL  Final   Special Requests NONE  Final   Culture   Final    NO GROWTH 1 DAY Performed at [facility], [address] [place], [place] [address]    Report Status PENDING  Incomplete         Radiology Studies: IR Paracentesis  Result Date: 08/26/2022 INDICATION: 57 year old male. History of  alcohol abuse with chronic pancreatitis presented to ED with abdominal pain. Patient presents for therapeutic and diagnostic paracentesis EXAM: ULTRASOUND GUIDED THERAPEUTIC AND DIAGNOSTIC PARACENTESIS MEDICATIONS: Lidocaine 1% 10 mL COMPLICATIONS: None immediate. PROCEDURE: Informed written consent was obtained from the patient after a discussion of the risks, benefits and alternatives to treatment. A timeout was performed prior to the initiation of the procedure. Initial ultrasound scanning demonstrates a moderate amount of ascites within the right lower abdominal quadrant. The right lower abdomen was prepped and draped in the usual sterile fashion. 1% lidocaine was used for local anesthesia. Following this, a 19 gauge, 7-cm, Yueh catheter was introduced. An ultrasound image was saved for documentation purposes. The paracentesis was performed. The catheter was removed and a dressing was applied. The patient tolerated the procedure well without immediate post procedural complication. FINDINGS: A total of approximately 3.1 L of serosanguineous fluid was removed. Samples were sent to the laboratory as requested by the clinical team. IMPRESSION: Successful ultrasound-guided paracentesis yielding 3.1 liters of peritoneal fluid. Performed by: Anders Grant, NP Electronically Signed   By: Marliss Coots M.D.   On: 08/26/2022 15:28   CT ABDOMEN PELVIS W CONTRAST  Result Date: 08/26/2022 CLINICAL DATA:  Acute abdominal pain EXAM: CT ABDOMEN AND PELVIS WITH CONTRAST TECHNIQUE: Multidetector CT imaging of the abdomen and pelvis was performed using the standard protocol following bolus administration of intravenous contrast. RADIATION DOSE REDUCTION: This exam was performed according to the departmental dose-optimization program which includes automated exposure control, adjustment of the mA and/or kV according to patient size and/or use of iterative reconstruction technique. CONTRAST:  75mL OMNIPAQUE IOHEXOL 350 MG/ML  SOLN COMPARISON:  CT 08/21/2022 and older FINDINGS: Lower chest: Coronary artery calcifications are seen. Please correlate for other coronary risk factors. Decreasing pleural effusions and adjacent opacities with some residual. Recommend continued follow-up. Hepatobiliary: Nodular contours of the diffusely fatty liver. Please correlate for history of chronic liver disease. Gallbladder is nondilated. Portal vein is patent. Pancreas: Preserved pancreatic parenchyma. There are some calcifications in the pancreatic head near the course of the pancreatic duct, unchanged from previous. Spleen: Normal in size without focal abnormality. Adrenals/Urinary Tract: Adrenal glands are preserved. Kidneys are unremarkable. No enhancing mass or collecting system dilatation. Normal caliber ureters down to the bladder. Bladder has a preserved contour. Bladder is underdistended. Stomach/Bowel: No oral contrast. Large bowel has a normal course and caliber with scattered stool. Stomach is underdistended. Small bowel is also nondilated. Vascular/Lymphatic: Aortic atherosclerosis. No enlarged abdominal or pelvic lymph nodes. Reproductive: Heterogeneous prostate with some mass effect along the base of the bladder. Please correlate for BPH changes in the patient's PSA. Other: Moderate diffuse ascites once again identified. The amount of ascites is increased from the prior CT scan. Small fat containing umbilical hernia with some mesh. No free air. Musculoskeletal: Scattered degenerative changes of the spine and pelvis. There are bridging osteophytes along the sacroiliac joints. Trace retrolisthesis of L5 on S1 with some disc bulging and stenosis. IMPRESSION: Increasing ascites. Nodular fatty liver. Stable areas of pancreatic calcification. Decreasing pleural effusions and adjacent opacities with some residual. Recommend continued follow-up Electronically Signed   By: Karen Kays M.D.   On: 08/26/2022 11:09   DG Chest 2 View  Result Date:  08/26/2022 CLINICAL DATA:  Epigastric pain EXAM: CHEST - 2 VIEW COMPARISON:  Previous studies including the examination of 08/17/2022 FINDINGS: Cardiac size is within normal limits. Low lung volumes. Linear densities are seen in the lower lung fields. There are no signs of alveolar pulmonary edema or focal pulmonary consolidation. There is minimal blunting of lateral CP angles. There is no pneumothorax. IMPRESSION: There are increased markings in the lower lung fields, more so on the right side suggesting atelectasis. There is blunting of lateral CP angles suggesting small bilateral effusions. Electronically Signed   By: Ernie Avena M.D.   On: 08/26/2022 10:35           LOS: 1 day   Time spent= 35 mins    Emaan Gary Joline Maxcy, MD [facility]  If 7PM-7AM, please contact night-coverage  08/28/2022, 7:51 AM

## 2022-08-28 NOTE — Plan of Care (Signed)

## 2022-08-29 DIAGNOSIS — E871 Hypo-osmolality and hyponatremia: Secondary | ICD-10-CM

## 2022-08-29 DIAGNOSIS — A419 Sepsis, unspecified organism: Secondary | ICD-10-CM | POA: Diagnosis not present

## 2022-08-29 DIAGNOSIS — K859 Acute pancreatitis without necrosis or infection, unspecified: Secondary | ICD-10-CM | POA: Diagnosis not present

## 2022-08-29 DIAGNOSIS — K861 Other chronic pancreatitis: Secondary | ICD-10-CM | POA: Diagnosis not present

## 2022-08-29 DIAGNOSIS — R188 Other ascites: Secondary | ICD-10-CM | POA: Diagnosis not present

## 2022-08-29 DIAGNOSIS — K852 Alcohol induced acute pancreatitis without necrosis or infection: Secondary | ICD-10-CM | POA: Diagnosis not present

## 2022-08-29 LAB — COMPREHENSIVE METABOLIC PANEL
ALT: 9 U/L (ref 0–44)
AST: 19 U/L (ref 15–41)
Albumin: 2.3 g/dL — ABNORMAL LOW (ref 3.5–5.0)
Alkaline Phosphatase: 45 U/L (ref 38–126)
Anion gap: 9 (ref 5–15)
BUN: 16 mg/dL (ref 6–20)
CO2: 20 mmol/L — ABNORMAL LOW (ref 22–32)
Calcium: 7.9 mg/dL — ABNORMAL LOW (ref 8.9–10.3)
Chloride: 101 mmol/L (ref 98–111)
Creatinine, Ser: 0.95 mg/dL (ref 0.61–1.24)
GFR, Estimated: >60 mL/min (ref >60)
Glucose, Bld: 93 mg/dL (ref 70–99)
Potassium: 3.6 mmol/L (ref 3.5–5.1)
Sodium: 130 mmol/L — ABNORMAL LOW (ref 135–145)
Total Bilirubin: 1.1 mg/dL (ref 0.3–1.2)
Total Protein: 5.8 g/dL — ABNORMAL LOW (ref 6.5–8.1)

## 2022-08-29 LAB — CBC
HCT: 25.3 % — ABNORMAL LOW (ref 39.0–52.0)
Hemoglobin: 8.2 g/dL — ABNORMAL LOW (ref 13.0–17.0)
MCH: 28.7 pg (ref 26.0–34.0)
MCHC: 32.4 g/dL (ref 30.0–36.0)
MCV: 88.5 fL (ref 80.0–100.0)
Platelets: 578 10*3/uL — ABNORMAL HIGH (ref 150–400)
RBC: 2.86 MIL/uL — ABNORMAL LOW (ref 4.22–5.81)
RDW: 21.3 % — ABNORMAL HIGH (ref 11.5–15.5)
WBC: 11.6 10*3/uL — ABNORMAL HIGH (ref 4.0–10.5)
nRBC: 0 % (ref 0.0–0.2)

## 2022-08-29 LAB — PHOSPHORUS: Phosphorus: 3.3 mg/dL (ref 2.5–4.6)

## 2022-08-29 LAB — URINE CULTURE: Special Requests: NORMAL

## 2022-08-29 LAB — MAGNESIUM: Magnesium: 1.7 mg/dL (ref 1.7–2.4)

## 2022-08-29 NOTE — Progress Notes (Signed)
PROGRESS NOTE    Samuel Blevins  ZOX:[id number] DOB: Dec 26, 1965 DOA: 08/26/2022 PCP: [name], MD   Brief Narrative:  57 year old with history of recurrent pancreatitis, chronic pancreatic insufficiency, HTN recurrent alcohol use comes to the hospital with complaints of abdominal pain, nausea, vomiting and diarrhea.  Patient was also found to have acute recurrent alcoholic pancreatitis.  Initially patient had left AMA about 4-5 days prior to this admission.    Assessment & Plan:   Principal Problem:   Sepsis (HCC) Active Problems:   Alcohol dependence syndrome (HCC)   Acute on chronic pancreatitis (HCC)   Pancreatitis   Acute pancreatitis   Other ascites   Recurrent alcohol induced pancreatitis. Nausea vomiting -Secondary to chronic alcohol use/abuse -Continue conservative management IV fluids, p.o. intake as tolerated, pain control avoiding IV narcotics, antiemetics, PPI   Decompensated liver cirrhosis with ascites Sepsis secondary to suspected spontaneous bacterial peritonitis Hypoalbuminemia -Sepsis evidenced by tachycardia and leukocytosis. -Status post paracentesis 7/1 - 3.1 L removed -Ascites fluid culture remains negative, blood cultures negative -Urine culture shows multiple species, likely contaminant -Continue empiric cefepime  Alcohol abuse - Alcohol withdrawal protocol ongoing, limit IV benzos as appropriate.    Essential hypertension -Coreg prn   GERD/duodenitis -Continue PPI   DVT prophylaxis: enoxaparin (LOVENOX) injection 40 mg Start: 08/26/22 1700 Code Status:   Code Status: Full Code Family Communication: None present  Status is: Inpatient  Dispo: The patient is from: Home              Anticipated d/c is to: To be determined              Anticipated d/c date is: 48 to 72 hours              Patient currently not medically stable for discharge given ongoing need for IV fluids, antibiotics, antiemetics, benzodiazepines as well as  analgesics  Consultants:  [facility]  Antimicrobials:  Cefepime  Subjective: No acute issues or events overnight denies nausea vomiting diarrhea constipation any fevers chills or chest pain.  Abdominal pain ongoing  Objective: Vitals:   08/28/22 1157 08/28/22 1609 08/28/22 2109 08/29/22 0537  BP: 120/79 124/79 121/77 130/87  Pulse: 98 97 99 98  Resp:   16 16  Temp:  98.6 F (37 C) 97.9 F (36.6 C) 98.5 F (36.9 C)  TempSrc:  Oral Oral Oral  SpO2: 99% 100% 98% 98%  Weight:      Height:        Intake/Output Summary (Last 24 hours) at 08/29/2022 0740 Last data filed at 08/28/2022 1608 Gross per 24 hour  Intake 420 ml  Output 300 ml  Net 120 ml   Filed Weights   08/26/22 0838  Weight: 77.1 kg    Examination:  General:  Pleasantly resting in bed, No acute distress. HEENT:  Normocephalic atraumatic.  Sclerae nonicteric, noninjected.  Extraocular movements intact bilaterally. Neck:  Without mass or deformity.  Trachea is midline. Lungs:  Clear to auscultate bilaterally without rhonchi, wheeze, or rales. Heart:  Regular rate and rhythm.  Without murmurs, rubs, or gallops. Abdomen:  Soft, moderately distended Extremities: Without cyanosis, clubbing, edema, or obvious deformity. Skin:  Warm and dry, no erythema.    Data Reviewed: I have personally reviewed following labs and imaging studies  CBC: Recent Labs  Lab 08/26/22 0847 08/27/22 0431 08/28/22 0442 08/29/22 0220  WBC 17.1* 13.9* 13.2* 11.6*  NEUTROABS 13.6*  --  10.9*  --   HGB 11.6* 9.2*  9.4* 8.2*  HCT 36.9* 28.4* 28.0* 25.3*  MCV 89.6 88.2 89.7 88.5  PLT 558* 535* 568* 578*   Basic Metabolic Panel: Recent Labs  Lab 08/26/22 0847 08/26/22 0850 08/27/22 0431 08/27/22 0752 08/28/22 0442 08/29/22 0220  NA 132*  --   --  128* 128* 130*  K 3.2*  --   --  3.0* 4.2 3.6  CL 98  --   --  95* 99 101  CO2 16*  --   --  23 21* 20*  GLUCOSE 103*  --   --  132* 103* 93  BUN 8  --   --  13 16 16    CREATININE 1.11  --   --  1.13 0.92 0.95  CALCIUM 7.6*  --   --  7.3* 7.4* 7.9*  MG  --  1.1* 1.4*  --  1.7 1.7  PHOS  --   --   --   --   --  3.3   GFR: Estimated Creatinine Clearance: 84.8 mL/min (by C-G formula based on SCr of 0.95 mg/dL). Liver Function Tests: Recent Labs  Lab 08/26/22 0847 08/28/22 0442 08/29/22 0220  AST 30 22 19   ALT 13 11 9   ALKPHOS 69 48 45  BILITOT 0.8 1.1 1.1  PROT 6.5 5.3* 5.8*  ALBUMIN 2.0* <1.5* 2.3*   Recent Labs  Lab 08/26/22 1050 08/27/22 0431 08/28/22 0442  LIPASE 445* 197* 384*   No results for input(s): "AMMONIA" in the last 168 hours. Coagulation Profile: Recent Labs  Lab 08/26/22 1002  INR 1.2   Cardiac Enzymes: No results for input(s): "CKTOTAL", "CKMB", "CKMBINDEX", "TROPONINI" in the last 168 hours. BNP (last 3 results) No results for input(s): "PROBNP" in the last 8760 hours. HbA1C: No results for input(s): "HGBA1C" in the last 72 hours. CBG: No results for input(s): "GLUCAP" in the last 168 hours. Lipid Profile: No results for input(s): "CHOL", "HDL", "LDLCALC", "TRIG", "CHOLHDL", "LDLDIRECT" in the last 72 hours. Thyroid Function Tests: No results for input(s): "TSH", "T4TOTAL", "FREET4", "T3FREE", "THYROIDAB" in the last 72 hours. Anemia Panel: No results for input(s): "VITAMINB12", "FOLATE", "FERRITIN", "TIBC", "IRON", "RETICCTPCT" in the last 72 hours. Sepsis Labs: Recent Labs  Lab 08/26/22 0847 08/26/22 1022 08/28/22 0442  LATICACIDVEN 2.1* 1.4 0.6    Recent Results (from the past 240 hour(s))  Body fluid culture w Gram Stain     Status: None   Collection Time: 08/21/22  2:53 PM   Specimen: PATH Cytology Peritoneal fluid  Result Value Ref Range Status   Specimen Description PERITONEAL  Final   Special Requests NONE  Final   Gram Stain   Final    FEW WBC PRESENT, PREDOMINANTLY PMN NO ORGANISMS SEEN    Culture   Final    NO GROWTH 3 DAYS Performed at [facility], [address]  [place], [place] [address]    Report Status 08/24/2022 FINAL  Final  Blood culture (routine x 2)     Status: None (Preliminary result)   Collection Time: 08/26/22  8:47 AM   Specimen: BLOOD  Result Value Ref Range Status   Specimen Description BLOOD SITE NOT SPECIFIED  Final   Special Requests   Final    BOTTLES DRAWN AEROBIC AND ANAEROBIC Blood Culture results may not be optimal due to an inadequate volume of blood received in culture bottles   Culture   Final    NO GROWTH 2 DAYS Performed at [facility], [address],  [place], [place] [address]    Report Status PENDING  Incomplete  Blood culture (routine x 2)     Status: None (Preliminary result)   Collection Time: 08/26/22  8:52 AM   Specimen: BLOOD  Result Value Ref Range Status   Specimen Description BLOOD SITE NOT SPECIFIED  Final   Special Requests   Final    BOTTLES DRAWN AEROBIC AND ANAEROBIC Blood Culture adequate volume   Culture   Final    NO GROWTH 2 DAYS Performed at [facility], [address], [place], [place] [address]    Report Status PENDING  Incomplete  Resp panel by RT-PCR (RSV, Flu A&B, Covid) Anterior Nasal Swab     Status: None   Collection Time: 08/26/22  9:52 AM   Specimen: Anterior Nasal Swab  Result Value Ref Range Status   SARS Coronavirus 2 by RT PCR NEGATIVE NEGATIVE Final   Influenza A by PCR NEGATIVE NEGATIVE Final   Influenza B by PCR NEGATIVE NEGATIVE Final    Comment: (NOTE) The Xpert Xpress SARS-CoV-2/FLU/RSV plus assay is intended as an aid in the diagnosis of influenza from Nasopharyngeal swab specimens and should not be used as a sole basis for treatment. Nasal washings and aspirates are unacceptable for Xpert Xpress SARS-CoV-2/FLU/RSV testing.  Fact Sheet for Patients: BloggerCourse.com  Fact Sheet for Healthcare Providers: SeriousBroker.it  This test is not yet approved or cleared by the [place] FDA and has been  authorized for detection and/or diagnosis of SARS-CoV-2 by FDA under an Emergency Use Authorization (EUA). This EUA will remain in effect (meaning this test can be used) for the duration of the COVID-19 declaration under Section 564(b)(1) of the Act, 21 U.S.C. section 360bbb-3(b)(1), unless the authorization is terminated or revoked.     Resp Syncytial Virus by PCR NEGATIVE NEGATIVE Final    Comment: (NOTE) Fact Sheet for Patients: BloggerCourse.com  Fact Sheet for Healthcare Providers: SeriousBroker.it  This test is not yet approved or cleared by the [place] FDA and has been authorized for detection and/or diagnosis of SARS-CoV-2 by FDA under an Emergency Use Authorization (EUA). This EUA will remain in effect (meaning this test can be used) for the duration of the COVID-19 declaration under Section 564(b)(1) of the Act, 21 U.S.C. section 360bbb-3(b)(1), unless the authorization is terminated or revoked.  Performed at [facility], [address] [place], [place] [address]   Gram stain     Status: None   Collection Time: 08/26/22  2:10 PM   Specimen: Abdomen; Peritoneal Fluid  Result Value Ref Range Status   Specimen Description PERITONEAL  Final   Special Requests NONE  Final   Gram Stain   Final    ABUNDANT WBC PRESENT,BOTH PMN AND MONONUCLEAR NO ORGANISMS SEEN Performed at [facility], [address], [place], [place] [address]    Report Status 08/26/2022 FINAL  Final  Culture, body fluid w Gram Stain-bottle     Status: None (Preliminary result)   Collection Time: 08/26/22  2:10 PM   Specimen: Peritoneal Washings  Result Value Ref Range Status   Specimen Description PERITONEAL  Final   Special Requests NONE  Final   Culture   Final    NO GROWTH 2 DAYS Performed at [facility], [address], [place], [place] [address]    Report Status PENDING  Incomplete         Radiology  Studies: No results found.      Scheduled  Meds:  acidophilus  2 capsule Oral TID   enoxaparin (LOVENOX) injection  40 mg Subcutaneous Q24H   feeding supplement  237 mL Oral BID BM   folic acid  1 mg Oral Daily   gabapentin  100 mg Oral TID   ketorolac  30 mg Intravenous Q6H   lidocaine (PF)  10 mL Intradermal Once   lipase/protease/amylase  12,000 Units Oral TID AC   multivitamin with minerals  1 tablet Oral Daily   pantoprazole  40 mg Oral BID AC   sucralfate  1 g Oral TID PC & HS   thiamine  100 mg Oral Daily   Continuous Infusions:  albumin human 25 g (08/29/22 0556)   ceFEPime (MAXIPIME) IV Stopped (08/29/22 0203)     LOS: 2 days   Time spent:  Azucena Fallen, DO [facility]  If 7PM-7AM, please contact night-coverage [email or web address]  08/29/2022, 7:40 AM

## 2022-08-29 NOTE — Plan of Care (Signed)

## 2022-08-29 NOTE — Progress Notes (Addendum)
Daily Progress Note  DOA: 08/26/2022 Hospital Day: 4 Chief Complaint:  infected peritonitis.    Assessment and Plan:    Brief Narrative:  Samuel Blevins is a 57 y.o. year old male with chronic pancreatitis, etoh abuse,  bacterial peritonitis admitted with persistent / recurrent infected peritonitis. Previously, SAAG was not compatible with portal hypertension though imaging this admission does show "nodular" contour of fatty liver. However, this could be pancreatic ascites, ? 2/2 pancreatic duct disruption  -Having a lot of abdominal discomfort. Renal function is normal. Afebrile, WBC improved 17K >> 11.6 K . Getting Maxipime 2 gms Q 8 hours. On day # 4 of antibiotics. Had 3 Liter paracentesis a few days ago, fluid cultures negative thus far. . -Continue IV albumin -Plan for follow up paracentesis tomorrow to see if responding to treatment. Will also check fluid for amylase / lipase. Will take off about 3 liters which may relieve some of his discomfort.   -Will request Dobhoff placement for nutrition.    ? Possible cirrhosis by imaging ( new finding this admission) described as "nodular" fatty liver. No convincing evidence for cirrhosis at this point, especially with normal platelets and normal INR.    Hypokalemia -Repletion per admitting team    Subjective / New Events:   Generalized abdominal pain, it hurts to even breathe.    Objective:   Recent Labs    08/27/22 0431 08/28/22 0442 08/29/22 0220  WBC 13.9* 13.2* 11.6*  HGB 9.2* 9.4* 8.2*  HCT 28.4* 28.0* 25.3*  PLT 535* 568* 578*   BMET Recent Labs    08/27/22 0752 08/28/22 0442 08/29/22 0220  NA 128* 128* 130*  K 3.0* 4.2 3.6  CL 95* 99 101  CO2 23 21* 20*  GLUCOSE 132* 103* 93  BUN 13 16 16   CREATININE 1.13 0.92 0.95  CALCIUM 7.3* 7.4* 7.9*   LFT Recent Labs    08/29/22 0220  PROT 5.8*  ALBUMIN 2.3*  AST 19  ALT 9  ALKPHOS 45  BILITOT 1.1   PT/INR Recent Labs    08/26/22 1002   LABPROT 15.3*  INR 1.2     Imaging:  IR Paracentesis INDICATION: 57 year old male. History of alcohol abuse with chronic pancreatitis presented to ED with abdominal pain. Patient presents for therapeutic and diagnostic paracentesis  EXAM: ULTRASOUND GUIDED THERAPEUTIC AND DIAGNOSTIC PARACENTESIS  MEDICATIONS: Lidocaine 1% 10 mL  COMPLICATIONS: None immediate.  PROCEDURE: Informed written consent was obtained from the patient after a discussion of the risks, benefits and alternatives to treatment. A timeout was performed prior to the initiation of the procedure.  Initial ultrasound scanning demonstrates a moderate amount of ascites within the right lower abdominal quadrant. The right lower abdomen was prepped and draped in the usual sterile fashion. 1% lidocaine was used for local anesthesia.  Following this, a 19 gauge, 7-cm, Yueh catheter was introduced. An ultrasound image was saved for documentation purposes. The paracentesis was performed. The catheter was removed and a dressing was applied. The patient tolerated the procedure well without immediate post procedural complication.  FINDINGS: A total of approximately 3.1 L of serosanguineous fluid was removed. Samples were sent to the laboratory as requested by the clinical team.  IMPRESSION: Successful ultrasound-guided paracentesis yielding 3.1 liters of peritoneal fluid.  Performed by: Anders Grant, NP  Electronically Signed   By: Marliss Coots M.D.   On: 08/26/2022 15:28 CT ABDOMEN PELVIS W CONTRAST CLINICAL DATA:  Acute abdominal pain  EXAM: CT ABDOMEN  AND PELVIS WITH CONTRAST  TECHNIQUE: Multidetector CT imaging of the abdomen and pelvis was performed using the standard protocol following bolus administration of intravenous contrast.  RADIATION DOSE REDUCTION: This exam was performed according to the departmental dose-optimization program which includes automated exposure control,  adjustment of the mA and/or kV according to patient size and/or use of iterative reconstruction technique.  CONTRAST:  75mL OMNIPAQUE IOHEXOL 350 MG/ML SOLN  COMPARISON:  CT 08/21/2022 and older  FINDINGS: Lower chest: Coronary artery calcifications are seen. Please correlate for other coronary risk factors. Decreasing pleural effusions and adjacent opacities with some residual. Recommend continued follow-up.  Hepatobiliary: Nodular contours of the diffusely fatty liver. Please correlate for history of chronic liver disease. Gallbladder is nondilated. Portal vein is patent.  Pancreas: Preserved pancreatic parenchyma. There are some calcifications in the pancreatic head near the course of the pancreatic duct, unchanged from previous.  Spleen: Normal in size without focal abnormality.  Adrenals/Urinary Tract: Adrenal glands are preserved. Kidneys are unremarkable. No enhancing mass or collecting system dilatation. Normal caliber ureters down to the bladder. Bladder has a preserved contour. Bladder is underdistended.  Stomach/Bowel: No oral contrast. Large bowel has a normal course and caliber with scattered stool. Stomach is underdistended. Small bowel is also nondilated.  Vascular/Lymphatic: Aortic atherosclerosis. No enlarged abdominal or pelvic lymph nodes.  Reproductive: Heterogeneous prostate with some mass effect along the base of the bladder. Please correlate for BPH changes in the patient's PSA.  Other: Moderate diffuse ascites once again identified. The amount of ascites is increased from the prior CT scan. Small fat containing umbilical hernia with some mesh. No free air.  Musculoskeletal: Scattered degenerative changes of the spine and pelvis. There are bridging osteophytes along the sacroiliac joints. Trace retrolisthesis of L5 on S1 with some disc bulging and stenosis.  IMPRESSION: Increasing ascites.  Nodular fatty liver.  Stable areas of pancreatic  calcification.  Decreasing pleural effusions and adjacent opacities with some residual. Recommend continued follow-up  Electronically Signed   By: Karen Kays M.D.   On: 08/26/2022 11:09 DG Chest 2 View CLINICAL DATA:  Epigastric pain  EXAM: CHEST - 2 VIEW  COMPARISON:  Previous studies including the examination of 08/17/2022  FINDINGS: Cardiac size is within normal limits. Low lung volumes. Linear densities are seen in the lower lung fields. There are no signs of alveolar pulmonary edema or focal pulmonary consolidation. There is minimal blunting of lateral CP angles. There is no pneumothorax.  IMPRESSION: There are increased markings in the lower lung fields, more so on the right side suggesting atelectasis. There is blunting of lateral CP angles suggesting small bilateral effusions.  Electronically Signed   By: Ernie Avena M.D.   On: 08/26/2022 10:35     Scheduled inpatient medications:   acidophilus  2 capsule Oral TID   enoxaparin (LOVENOX) injection  40 mg Subcutaneous Q24H   feeding supplement  237 mL Oral BID BM   folic acid  1 mg Oral Daily   gabapentin  100 mg Oral TID   ketorolac  30 mg Intravenous Q6H   lidocaine (PF)  10 mL Intradermal Once   lipase/protease/amylase  12,000 Units Oral TID AC   multivitamin with minerals  1 tablet Oral Daily   pantoprazole  40 mg Oral BID AC   sucralfate  1 g Oral TID PC & HS   thiamine  100 mg Oral Daily   Continuous inpatient infusions:   albumin human 25 g (08/29/22 0556)   ceFEPime (  MAXIPIME) IV 2 g (08/29/22 0817)   PRN inpatient medications: acetaminophen, guaiFENesin, hydrALAZINE, hydrOXYzine, ipratropium-albuterol, LORazepam **OR** LORazepam, metoprolol tartrate, ondansetron (ZOFRAN) IV, oxyCODONE, senna-docusate, traZODone  Vital signs in last 24 hours: Temp:  [97.9 F (36.6 C)-98.6 F (37 C)] 98.5 F (36.9 C) (07/04 0537) Pulse Rate:  [97-99] 98 (07/04 0748) Resp:  [16] 16 (07/04 0537) BP:  (120-130)/(77-88) 129/88 (07/04 0748) SpO2:  [98 %-100 %] 98 % (07/04 0537) Last BM Date : 08/27/22  Intake/Output Summary (Last 24 hours) at 08/29/2022 0941 Last data filed at 08/28/2022 1608 Gross per 24 hour  Intake 240 ml  Output 300 ml  Net -60 ml    Intake/Output from previous day: 07/03 0701 - 07/04 0700 In: 420 [P.O.:420] Out: 300 [Urine:300] Intake/Output this shift: No intake/output data recorded.   Physical Exam:  General: Alert thin male in NAD Heart:  Regular rate and rhythm.  Pulmonary: Normal respiratory effort Abdomen: Soft, largely distended, generalized tenderness, hypoactive bowel sounds. Extremities: No lower extremity edema  Neurologic: Alert and oriented Psych: Pleasant. Cooperative. Insight appears normal.    Principal Problem:   Sepsis (HCC) Active Problems:   Alcohol dependence syndrome (HCC)   Acute on chronic pancreatitis (HCC)   Pancreatitis   Acute pancreatitis   Other ascites     LOS: 2 days   Willette Cluster ,NP 08/29/2022, 9:41 AM   I have taken an interval history, thoroughly reviewed the chart and examined the patient. I agree with the Advanced Practitioner's note, impression and recommendations, and have recorded additional findings, impressions and recommendations below. I performed a substantive portion of this encounter (>50% time spent), including a complete performance of the medical decision making.  My additional thoughts are as follows:  He is quite tender on exam, and he has a significant amount of ascites present with distention.  Bowel sounds are present but scant.  He says he passed some gas earlier today and he is not vomiting, so he does not appear to have a bowel obstruction.  Nevertheless, his oral intake is quite poor and I agree with Dr. Meridee Score this patient needs a small bore nasoduodenal or nasogastric feeding tube.  He has severe protein calorie malnutrition from his acute on chronic illness and underlying alcohol  abuse with chronic pancreatitis, and I will be difficult for him to recover from this without adequate nutrition.  Since this is a holiday weekend, please consider contacting the feeding tube placement team today or tomorrow so he does not go many more days without adequate nutrition.  Would prefer that to TPN since his gut is currently working and enteric feeds are less risky than central line.  This patient will have a repeat paracentesis tomorrow for both diagnostic and therapeutic reasons.  Multiple studies will be obtained as there is great clinical concern this patient has pancreatic ascites from either the inflammatory component of his pancreatitis or perhaps a disrupted pancreatic duct.  2 to 3 L of fluid will be removed at the same time for relief of distention and pain.  Caution is warranted regarding the chance of post paracentesis circulatory dysfunction, and he is currently on IV albumin.  Our service will continue to follow this patient.     Samuel Blevins Office:[phone]

## 2022-08-30 ENCOUNTER — Inpatient Hospital Stay (HOSPITAL_COMMUNITY): Payer: MEDICAID

## 2022-08-30 DIAGNOSIS — K8689 Other specified diseases of pancreas: Secondary | ICD-10-CM | POA: Insufficient documentation

## 2022-08-30 DIAGNOSIS — D500 Iron deficiency anemia secondary to blood loss (chronic): Secondary | ICD-10-CM | POA: Diagnosis not present

## 2022-08-30 DIAGNOSIS — K709 Alcoholic liver disease, unspecified: Secondary | ICD-10-CM

## 2022-08-30 DIAGNOSIS — K861 Other chronic pancreatitis: Secondary | ICD-10-CM | POA: Diagnosis not present

## 2022-08-30 DIAGNOSIS — R188 Other ascites: Secondary | ICD-10-CM | POA: Diagnosis not present

## 2022-08-30 DIAGNOSIS — F1090 Alcohol use, unspecified, uncomplicated: Secondary | ICD-10-CM | POA: Diagnosis not present

## 2022-08-30 DIAGNOSIS — E43 Unspecified severe protein-calorie malnutrition: Secondary | ICD-10-CM | POA: Insufficient documentation

## 2022-08-30 DIAGNOSIS — K852 Alcohol induced acute pancreatitis without necrosis or infection: Secondary | ICD-10-CM | POA: Diagnosis not present

## 2022-08-30 DIAGNOSIS — K859 Acute pancreatitis without necrosis or infection, unspecified: Secondary | ICD-10-CM | POA: Diagnosis not present

## 2022-08-30 DIAGNOSIS — A419 Sepsis, unspecified organism: Secondary | ICD-10-CM

## 2022-08-30 HISTORY — PX: IR PARACENTESIS: IMG2679

## 2022-08-30 LAB — BODY FLUID CELL COUNT WITH DIFFERENTIAL
Eos, Fluid: 1 %
Lymphs, Fluid: 1 %
Monocyte-Macrophage-Serous Fluid: 21 % — ABNORMAL LOW (ref 50–90)
Neutrophil Count, Fluid: 77 % — ABNORMAL HIGH (ref 0–25)
Total Nucleated Cell Count, Fluid: 3235 cu mm — ABNORMAL HIGH (ref 0–1000)

## 2022-08-30 LAB — COMPREHENSIVE METABOLIC PANEL
ALT: 9 U/L (ref 0–44)
AST: 21 U/L (ref 15–41)
Albumin: 2.8 g/dL — ABNORMAL LOW (ref 3.5–5.0)
Alkaline Phosphatase: 48 U/L (ref 38–126)
Anion gap: 9 (ref 5–15)
BUN: 16 mg/dL (ref 6–20)
CO2: 20 mmol/L — ABNORMAL LOW (ref 22–32)
Calcium: 8.3 mg/dL — ABNORMAL LOW (ref 8.9–10.3)
Chloride: 102 mmol/L (ref 98–111)
Creatinine, Ser: 0.99 mg/dL (ref 0.61–1.24)
GFR, Estimated: >60 mL/min (ref >60)
Glucose, Bld: 97 mg/dL (ref 70–99)
Potassium: 3.5 mmol/L (ref 3.5–5.1)
Sodium: 131 mmol/L — ABNORMAL LOW (ref 135–145)
Total Bilirubin: 1.2 mg/dL (ref 0.3–1.2)
Total Protein: 6.3 g/dL — ABNORMAL LOW (ref 6.5–8.1)

## 2022-08-30 LAB — GRAM STAIN

## 2022-08-30 LAB — CBC
HCT: 24.9 % — ABNORMAL LOW (ref 39.0–52.0)
Hemoglobin: 8.1 g/dL — ABNORMAL LOW (ref 13.0–17.0)
MCH: 28.4 pg (ref 26.0–34.0)
MCHC: 32.5 g/dL (ref 30.0–36.0)
MCV: 87.4 fL (ref 80.0–100.0)
Platelets: 668 10*3/uL — ABNORMAL HIGH (ref 150–400)
RBC: 2.85 MIL/uL — ABNORMAL LOW (ref 4.22–5.81)
RDW: 21.2 % — ABNORMAL HIGH (ref 11.5–15.5)
WBC: 11.1 10*3/uL — ABNORMAL HIGH (ref 4.0–10.5)
nRBC: 0 % (ref 0.0–0.2)

## 2022-08-30 LAB — CULTURE, BODY FLUID W GRAM STAIN -BOTTLE

## 2022-08-30 LAB — CULTURE, BLOOD (ROUTINE X 2): Culture: NO GROWTH

## 2022-08-30 LAB — AMYLASE, PLEURAL OR PERITONEAL FLUID: Amylase, Fluid: >10000 U/L

## 2022-08-30 LAB — MAGNESIUM: Magnesium: 1.7 mg/dL (ref 1.7–2.4)

## 2022-08-30 LAB — AFP TUMOR MARKER: AFP, Serum, Tumor Marker: <1.8 ng/mL (ref 0.0–8.4)

## 2022-08-30 MED ORDER — OCTREOTIDE ACETATE 50 MCG/ML IJ SOLN
100.0000 ug | Freq: Two times a day (BID) | INTRAMUSCULAR | Status: DC
  Administered 2022-08-30 – 2022-09-03 (×7): 100 ug via SUBCUTANEOUS
  Filled 2022-08-30 (×10): qty 2

## 2022-08-30 MED ORDER — LIDOCAINE HCL 1 % IJ SOLN
INTRAMUSCULAR | Status: AC
  Filled 2022-08-30: qty 20

## 2022-08-30 MED ORDER — PROSOURCE TF20 ENFIT COMPATIBL EN LIQD
60.0000 mL | Freq: Every day | ENTERAL | Status: DC
  Administered 2022-08-30 – 2022-09-16 (×12): 60 mL
  Filled 2022-08-30 (×12): qty 60

## 2022-08-30 MED ORDER — VITAL 1.5 CAL PO LIQD
1000.0000 mL | ORAL | Status: DC
  Administered 2022-08-30 – 2022-09-16 (×10): 1000 mL
  Filled 2022-08-30 (×26): qty 1000

## 2022-08-30 MED ORDER — OSMOLITE 1.2 CAL PO LIQD
1000.00 mL | ORAL | Status: DC
Start: 2022-08-30 — End: 2022-08-30

## 2022-08-30 MED ORDER — GADOBUTROL 1 MMOL/ML IV SOLN
7.0000 mL | Freq: Once | INTRAVENOUS | Status: AC | PRN
  Administered 2022-08-30: 7 mL via INTRAVENOUS

## 2022-08-30 NOTE — Progress Notes (Signed)
Daily Progress Note  DOA: 08/26/2022 Hospital Day: 5 Chief Complaint: infected pancreatic ascites    Assessment and Plan:    Brief Narrative:  Samuel Blevins is a 57 y.o. year old male with chronic pancreatitis, etoh abuse, admitted with persistent / recurrent infected peritonitis. Previously, SAAG was not compatible with portal hypertension which now makes since as this is pancreatic ascites with large amount of amylase in ascitic fluid.  However coincidentally he could still have cirrhosis as imaging this admission does show a"nodular" contour of fatty liver.   Chronic pancreatitis, now with infected pancreatic ascites likely from pancreatic duct disruption.   -Ascitic fluid culture from 4 days ago is negative. Repeat 3 L paracentesis today shows no improvement in cell count ( PMNs 3200 with 77% Neutrophils). Additionally there is  fluid contains > 10,000 u/L amylase. Acid fast culture pending. Spoke briefly to Infectious Disease who thought adding Flagyl may be beneficial pending today's fluid culture but will just await today's culture results -Obtain MRI / MRCP to evaluate pancreas / pancreatic duct -Octreotide 100 mg BID -2D Echo -Will likely need ERCP in near future.     Subjective / New Events:   Still with generalized abdominal pain. Just got cortrak placed, will start enteral feedings soon.   Objective:   Recent Labs    08/28/22 0442 08/29/22 0220 08/30/22 0401  WBC 13.2* 11.6* 11.1*  HGB 9.4* 8.2* 8.1*  HCT 28.0* 25.3* 24.9*  PLT 568* 578* 668*   BMET Recent Labs    08/28/22 0442 08/29/22 0220 08/30/22 0401  NA 128* 130* 131*  K 4.2 3.6 3.5  CL 99 101 102  CO2 21* 20* 20*  GLUCOSE 103* 93 97  BUN 16 16 16   CREATININE 0.92 0.95 0.99  CALCIUM 7.4* 7.9* 8.3*   LFT Recent Labs    08/30/22 0401  PROT 6.3*  ALBUMIN 2.8*  AST 21  ALT 9  ALKPHOS 48  BILITOT 1.2   PT/INR No results for input(s): "LABPROT", "INR" in the last 72  hours.   Imaging:  IR Paracentesis INDICATION: Patient with a history of alcohol abuse, chronic pancreatitis admitted for acute pancreatitis and found to have new onset ascites. Request received to perform diagnostic and therapeutic paracentesis.  EXAM: ULTRASOUND GUIDED DIAGNOSTIC AND THERAPEUTIC PARACENTESIS  MEDICATIONS: 8 cc 1% lidocaine  COMPLICATIONS: None immediate.  PROCEDURE: Informed written consent was obtained from the patient after a discussion of the risks, benefits and alternatives to treatment. A timeout was performed prior to the initiation of the procedure.  Initial ultrasound scanning demonstrates a moderate amount of ascites within the right lower abdominal quadrant. The right lower abdomen was prepped and draped in the usual sterile fashion. 1% lidocaine was used for local anesthesia.  Following this, a 19 gauge, 7-cm, Yueh catheter was introduced. An ultrasound image was saved for documentation purposes. The paracentesis was performed. The catheter was removed and a dressing was applied. The patient tolerated the procedure well without immediate post procedural complication.  FINDINGS: A total of approximately 3 L of yellow-red fluid was removed. Samples were sent to the laboratory as requested by the clinical team.  IMPRESSION: Successful ultrasound-guided paracentesis yielding 3 liters of peritoneal fluid.  Procedure performed by Mina Marble, PA-C  Electronically Signed   By: Roanna Banning M.D.   On: 08/30/2022 12:26     Scheduled inpatient medications:   acidophilus  2 capsule Oral TID   enoxaparin (LOVENOX) injection  40 mg Subcutaneous  Q24H   feeding supplement  237 mL Oral BID BM   folic acid  1 mg Oral Daily   gabapentin  100 mg Oral TID   ketorolac  30 mg Intravenous Q6H   lidocaine (PF)  10 mL Intradermal Once   lipase/protease/amylase  12,000 Units Oral TID AC   multivitamin with minerals  1 tablet Oral Daily    pantoprazole  40 mg Oral BID AC   sucralfate  1 g Oral TID PC & HS   thiamine  100 mg Oral Daily   Continuous inpatient infusions:   ceFEPime (MAXIPIME) IV 2 g (08/30/22 1025)   PRN inpatient medications: acetaminophen, guaiFENesin, hydrALAZINE, hydrOXYzine, ipratropium-albuterol, metoprolol tartrate, ondansetron (ZOFRAN) IV, oxyCODONE, senna-docusate, traZODone  Vital signs in last 24 hours: Temp:  [97.8 F (36.6 C)-98.8 F (37.1 C)] 98.5 F (36.9 C) (07/05 0743) Pulse Rate:  [99-107] 99 (07/05 0743) Resp:  [17-18] 18 (07/05 0743) BP: (119-143)/(66-93) 119/66 (07/05 0922) SpO2:  [98 %-99 %] 99 % (07/05 0743) Weight:  [74.2 kg] 74.2 kg (07/05 1300) Last BM Date : 08/27/22  Intake/Output Summary (Last 24 hours) at 08/30/2022 1424 Last data filed at 08/30/2022 0725 Gross per 24 hour  Intake 1299.54 ml  Output 600 ml  Net 699.54 ml    Intake/Output from previous day: 07/04 0701 - 07/05 0700 In: 1299.5 [IV Piggyback:1299.5] Out: 300 [Urine:300] Intake/Output this shift: Total I/O In: -  Out: 400 [Urine:400]   Physical Exam:  General: Alert male in NAD Heart:  Regular rate and rhythm.  Pulmonary: Normal respiratory effort Abdomen: Soft, moderately distended, hyperactive  bowel sounds. Neurologic: Alert and oriented.    Principal Problem:   Sepsis (HCC) Active Problems:   Alcohol dependence syndrome (HCC)   Acute on chronic pancreatitis (HCC)   Pancreatitis   Acute pancreatitis   Other ascites     LOS: 3 days   Willette Cluster ,NP 08/30/2022, 2:24 PM

## 2022-08-30 NOTE — Procedures (Addendum)
Cortrak  Person Inserting Tube:  Emeril Stille T, RD Tube Type:  Cortrak - 43 inches Tube Size:  10 Tube Location:  Right nare Secured by: Bridle Technique Used to Measure Tube Placement:  Marking at nare/corner of mouth Cortrak Secured At:  99 cm  *Was secured at 69cm, see addendum below   Cortrak Tube Team Note:  Consult received to place a Cortrak feeding tube.   X-ray is required, abdominal x-ray has been ordered by the Cortrak team. Please confirm tube placement before using the Cortrak tube.   If the tube becomes dislodged please keep the tube and contact the Cortrak team at [email or web address] for replacement.  If after hours and replacement cannot be delayed, place a NG tube and confirm placement with an abdominal x-ray.    Shelle Iron RD, LDN For contact information, refer to [facility].    ADDENDUM: Received secure chat from GI NP Willette Cluster that patient's ascites is from pancreatic duct disruption so Cortrak tube need to be post pyloric.  Went back to bedside and advanced Cortrak tube to new marking of 99cm.   X-ray is required, abdominal x-ray has been ordered by the Cortrak team. Please confirm tube placement before using the Cortrak tube.    Shelle Iron RD, LDN For contact information, refer to [facility].

## 2022-08-30 NOTE — Progress Notes (Addendum)
Nutrition Follow-up  DOCUMENTATION CODES:   Severe malnutrition in context of chronic illness  INTERVENTION:  Initiate tube feeds via Cortrak (currently gastric): -Initiate Vital 1.5 at 15 mL/hour and advance by 10 mL/hour every 12 hours to goal rate of 55 mL/hour (1320 mL goal daily volume) -Provide PROSource TF20 60 mL daily per tube -Provides: 2060 kcal, 109 grams of protein, 1003 mL H2O daily  Plan is to attempt to reposition tube to be post-pyloric on Monday.  Monitor magnesium, potassium, and phosphorus daily for at least 3 days, MD to replete as needed, as pt is at risk for refeeding syndrome.   After discussing with MD, plan is to liberalize diet to regular and liberalize fluid restriction a little to 1500 mL.  Continue Ensure Enlive po BID, each supplement provides 350 kcal and 20 grams of protein. Do not include in fluid restriction.  Continue multivitamin with minerals daily, thiamine 100 mg daily, and folic acid 1 mg daily.  NUTRITION DIAGNOSIS:   Severe Malnutrition related to chronic illness (pancreatitis) as evidenced by severe fat depletion, severe muscle depletion, moderate fat depletion, moderate muscle depletion.  New nutrition diagnosis.  GOAL:   Patient will meet greater than or equal to 90% of their needs  Not met - addressing with initiation of tube feeds.  MONITOR:   PO intake, Supplement acceptance, Labs, Weight trends, TF tolerance, I & O's  REASON FOR ASSESSMENT:   Consult Enteral/tube feeding initiation and management  ASSESSMENT:   57 year old male with PMHx of EtOH abuse, chronic pancreatitis, chronic pancreatic insufficiency, HTN who is admitted with persistent/recurrent infected peritonitis now with infected pancreatic ascites likely from pancreatic duct disruption.  7/5: s/p paracentesis with 3 L removed; Cortrak tube placed and starting tube feeds  Pt is being followed by GI. Plan is for repeat paracentesis tomorrow. They also  recommended placement of Cortrak tube for initiation of enteral nutrition. GI suspects ascites is from pancreatic duct disruption, so requested Cortrak tube be placed post-pyloric. Noted plan for MRI/MRCP to evaluate pancreas/pancreatic duct and will likely need ERCP in near future.  Met with pt at bedside. He reports his appetite has been poor this admission. Currently ordered for 2 gram sodium diet with 1200 mL fluid restriction. No meal documentation available in chart except for 0% of one meal on 7/1. He reports he is eating <50% of his meals. He reports today after paracentesis he was able to eat half of his sandwich and is also working on drinking Ensure. He reports he has had 1/2-1 bottle of Ensure daily this admission. Pt reports MD discussed plan for placement of Cortrak tube today for initiation of enteral nutrition. Answered some other questions patient had about Cortrak tube and tube feeds. Pt expressed understanding. He reports he is "not happy about it" but knows nutrition is important.  Also asked further questions regarding nutrition history. Pt initially reported he had a good appetite and intake PTA. However, upon further assessment he reported his intake has likely been poor for several months. He reports he would try to eat 2 meals per day, but would not always eat well at meals. Unable to provide further history on exact intake PTA. He denies food allergies or intolerances. Reports occasional nausea. Endorses abdominal pain in setting of pancreatitis. Denies any difficulty with chewing/swallowing. Pt reports he is motivated to keep trying to eat and also drink Ensure supplements.  Pt reports his UBW was 175-180 lbs (79.5-81.8 kg). He reports he has lost weight  over time related to poor PO intake. Noted weights in chart have fluctuated, which is likely related to fluid status. Suspect admission wt of exactly 170 lbs was stated and not measured. RD obtained bed scale weight today 74.2 kg  (163.58 lbs), which was taken after paracentesis.  Medications reviewed and include: acidophilus 2 capsules TID, Ensure Enlive, folic acid 1 mg daily, gabapentin, Creon 12000 units TID before meals, MVI, pantoprazole, Carafate 1 gram TID and at bedtime, thiamine 100 mg daily, cefepime  Labs reviewed: Sodium 131, CO2 20. Potassium and Magnesium WNL today. Phosphorus WNL on 7/4.  Enteral Access: 10 Fr. Cortrak tube placed 7/5 right nare Initially decided tube could be gastric, but then later with new findings of pancreatic duct disruption request was to see if tube could be advanced post-pyloric. Per abdominal x-ray 08/30/22 after advancement of tube, tube curls in right mid abdomen with tip in gastric fundus  UOP: 300 mL (0.2 mL/kg/hr) in previous 24 hours  I/O: +2029.1 mL since admission  Discussed with RN. Discussed with MD via secure chat. Plan is to liberalize diet to regular and also liberalize fluid restriction to 1500 mL.  Discussed with MD and GI via secure chat after tube was advanced and found to still be in stomach. Plan is to go ahead and start tube feeds. Plan is to put in consult to reposition tube post-pyloric on Monday (ideally to LOT).  NUTRITION - FOCUSED PHYSICAL EXAM:  Flowsheet Row Most Recent Value  Orbital Region Moderate depletion  Upper Arm Region Severe depletion  Thoracic and Lumbar Region Moderate depletion  Buccal Region Severe depletion  Temple Region Severe depletion  Clavicle Bone Region Moderate depletion  Clavicle and Acromion Bone Region Severe depletion  Scapular Bone Region Moderate depletion  Dorsal Hand Moderate depletion  Patellar Region Severe depletion  Anterior Thigh Region Severe depletion  Posterior Calf Region Severe depletion  Edema (RD Assessment) None  Hair Reviewed  Eyes Reviewed  Mouth Reviewed  Skin Reviewed  Nails Reviewed       Diet Order:   Diet Order             Diet regular Room service appropriate? Yes; Fluid  consistency: Thin; Fluid restriction: 1500 mL Fluid  Diet effective now                  EDUCATION NEEDS:   No education needs have been identified at this time  Skin:  Skin Assessment: Reviewed RN Assessment  Last BM:  08/27/22 per chart  Height:   Ht Readings from Last 1 Encounters:  08/26/22 5\' 9"  (1.753 m)   Weight:   Wt Readings from Last 1 Encounters:  08/30/22 74.2 kg   Ideal Body Weight:  72.7 kg  BMI:  Body mass index is 24.16 kg/m.  Estimated Nutritional Needs:   Kcal:  2100-2300  Protein:  105-115 grams  Fluid:  2.1-2.3 L/day  Letta Median, MS, RD, LDN, CNSC Pager number available on Amion

## 2022-08-30 NOTE — Plan of Care (Signed)

## 2022-08-30 NOTE — Progress Notes (Signed)
PROGRESS NOTE    Samuel Blevins  ZOX:[id number] DOB: [birth year] DOA: 08/26/2022 PCP: [name], MD   Brief Narrative:  57 year old with history of recurrent pancreatitis, chronic pancreatic insufficiency, HTN recurrent alcohol use comes to the hospital with complaints of abdominal pain, nausea, vomiting and diarrhea.  Patient was also found to have acute recurrent alcoholic pancreatitis.  Initially patient had left AMA about 4-5 days prior to this admission.    Assessment & Plan:   Principal Problem:   Sepsis (HCC) Active Problems:   Alcohol dependence syndrome (HCC)   Acute on chronic pancreatitis (HCC)   Pancreatitis   Acute pancreatitis   Other ascites   Recurrent alcohol induced pancreatitis. Nausea vomiting -Secondary to chronic alcohol use/abuse -Continue conservative management IV fluids, p.o. intake as tolerated, pain control avoiding IV narcotics, antiemetics, PPI   Decompensated liver cirrhosis with ascites Sepsis secondary to suspected spontaneous bacterial peritonitis Hypoalbuminemia Hypovolemic hyponatremia -Sepsis evidenced by tachycardia and leukocytosis. -Status post paracentesis 7/1 - 3.1 L removed; repeat para 7/5 - 3L withdrawn -Ascites fluid culture remains negative, blood cultures negative, repeat fluid culture from 7/5 fluid -Urine culture shows multiple species, likely contaminant -Continue empiric cefepime -Continue octreotide -Will likely need ERCP per GI  Severe protein caloric malnutrition  -Complicated by chronic alcohol use -Core track placed 7/5 -high risk for refeeding syndrome per dietary -Continue to follow mag,phos,bmp -replete as appropriate  Alcohol abuse - Alcohol withdrawal protocol ongoing, limit IV benzos as appropriate.    Essential hypertension -Currently well-controlled off medications, as needed hydralazine for hypertensive events   GERD/duodenitis -Continue PPI   DVT prophylaxis: enoxaparin (LOVENOX)  injection 40 mg Start: 08/26/22 1700 Code Status:   Code Status: Full Code Family Communication: None present  Status is: Inpatient  Dispo: The patient is from: Home              Anticipated d/c is to: To be determined              Anticipated d/c date is: 48 to 72 hours              Patient currently not medically stable for discharge given ongoing need for IV fluids, antibiotics, antiemetics, benzodiazepines as well as analgesics  Consultants:  [facility]  Antimicrobials:  Cefepime  Subjective: No acute issues or events overnight, continues to complain of generalized abdominal pain denies headache fevers chills shortness of breath or chest pain  Objective: Vitals:   08/29/22 0748 08/29/22 1605 08/29/22 1941 08/30/22 0452  BP: 129/88 (!) 143/82 132/89 125/88  Pulse: 98 (!) 107 (!) 104 (!) 102  Resp:  17 18 18   Temp:  98.8 F (37.1 C) 97.8 F (36.6 C) 98.6 F (37 C)  TempSrc:      SpO2:  99% 98% 98%  Weight:      Height:        Intake/Output Summary (Last 24 hours) at 08/30/2022 0717 Last data filed at 08/30/2022 0405 Gross per 24 hour  Intake 1299.54 ml  Output 300 ml  Net 999.54 ml    Filed Weights   08/26/22 0838  Weight: 77.1 kg    Examination:  General:  Pleasantly resting in bed, No acute distress. HEENT:  Normocephalic atraumatic.  Sclerae nonicteric, noninjected.  Extraocular movements intact bilaterally. Neck:  Without mass or deformity.  Trachea is midline. Lungs:  Clear to auscultate bilaterally without rhonchi, wheeze, or rales. Heart:  Regular rate and rhythm.  Without murmurs, rubs, or gallops. Abdomen:  Soft, moderately distended Extremities: Without cyanosis, clubbing, edema, or obvious deformity. Skin:  Warm and dry, no erythema.    Data Reviewed: I have personally reviewed following labs and imaging studies  CBC: Recent Labs  Lab 08/26/22 0847 08/27/22 0431 08/28/22 0442 08/29/22 0220 08/30/22 0401  WBC 17.1* 13.9* 13.2* 11.6*  11.1*  NEUTROABS 13.6*  --  10.9*  --   --   HGB 11.6* 9.2* 9.4* 8.2* 8.1*  HCT 36.9* 28.4* 28.0* 25.3* 24.9*  MCV 89.6 88.2 89.7 88.5 87.4  PLT 558* 535* 568* 578* 668*    Basic Metabolic Panel: Recent Labs  Lab 08/26/22 0847 08/26/22 0850 08/27/22 0431 08/27/22 0752 08/28/22 0442 08/29/22 0220 08/30/22 0401  NA 132*  --   --  128* 128* 130* 131*  K 3.2*  --   --  3.0* 4.2 3.6 3.5  CL 98  --   --  95* 99 101 102  CO2 16*  --   --  23 21* 20* 20*  GLUCOSE 103*  --   --  132* 103* 93 97  BUN 8  --   --  13 16 16 16   CREATININE 1.11  --   --  1.13 0.92 0.95 0.99  CALCIUM 7.6*  --   --  7.3* 7.4* 7.9* 8.3*  MG  --  1.1* 1.4*  --  1.7 1.7 1.7  PHOS  --   --   --   --   --  3.3  --     GFR: Estimated Creatinine Clearance: 81.3 mL/min (by C-G formula based on SCr of 0.99 mg/dL). Liver Function Tests: Recent Labs  Lab 08/26/22 0847 08/28/22 0442 08/29/22 0220 08/30/22 0401  AST 30 22 19 21   ALT 13 11 9 9   ALKPHOS 69 48 45 48  BILITOT 0.8 1.1 1.1 1.2  PROT 6.5 5.3* 5.8* 6.3*  ALBUMIN 2.0* <1.5* 2.3* 2.8*    Recent Labs  Lab 08/26/22 1050 08/27/22 0431 08/28/22 0442  LIPASE 445* 197* 384*    No results for input(s): "AMMONIA" in the last 168 hours. Coagulation Profile: Recent Labs  Lab 08/26/22 1002  INR 1.2    Cardiac Enzymes: No results for input(s): "CKTOTAL", "CKMB", "CKMBINDEX", "TROPONINI" in the last 168 hours. BNP (last 3 results) No results for input(s): "PROBNP" in the last 8760 hours. HbA1C: No results for input(s): "HGBA1C" in the last 72 hours. CBG: No results for input(s): "GLUCAP" in the last 168 hours. Lipid Profile: No results for input(s): "CHOL", "HDL", "LDLCALC", "TRIG", "CHOLHDL", "LDLDIRECT" in the last 72 hours. Thyroid Function Tests: No results for input(s): "TSH", "T4TOTAL", "FREET4", "T3FREE", "THYROIDAB" in the last 72 hours. Anemia Panel: No results for input(s): "VITAMINB12", "FOLATE", "FERRITIN", "TIBC", "IRON",  "RETICCTPCT" in the last 72 hours. Sepsis Labs: Recent Labs  Lab 08/26/22 0847 08/26/22 1022 08/28/22 0442  LATICACIDVEN 2.1* 1.4 0.6     Recent Results (from the past 240 hour(s))  Body fluid culture w Gram Stain     Status: None   Collection Time: 08/21/22  2:53 PM   Specimen: PATH Cytology Peritoneal fluid  Result Value Ref Range Status   Specimen Description PERITONEAL  Final   Special Requests NONE  Final   Gram Stain   Final    FEW WBC PRESENT, PREDOMINANTLY PMN NO ORGANISMS SEEN    Culture   Final    NO GROWTH 3 DAYS Performed at [facility], [address], [place], [place] [address]  Report Status 08/24/2022 FINAL  Final  Blood culture (routine x 2)     Status: None (Preliminary result)   Collection Time: 08/26/22  8:47 AM   Specimen: BLOOD  Result Value Ref Range Status   Specimen Description BLOOD SITE NOT SPECIFIED  Final   Special Requests   Final    BOTTLES DRAWN AEROBIC AND ANAEROBIC Blood Culture results may not be optimal due to an inadequate volume of blood received in culture bottles   Culture   Final    NO GROWTH 3 DAYS Performed at [facility], [address] [place], [place] [address]    Report Status PENDING  Incomplete  Blood culture (routine x 2)     Status: None (Preliminary result)   Collection Time: 08/26/22  8:52 AM   Specimen: BLOOD  Result Value Ref Range Status   Specimen Description BLOOD SITE NOT SPECIFIED  Final   Special Requests   Final    BOTTLES DRAWN AEROBIC AND ANAEROBIC Blood Culture adequate volume   Culture   Final    NO GROWTH 3 DAYS Performed at [facility], [address], [place], [place] [address]    Report Status PENDING  Incomplete  Resp panel by RT-PCR (RSV, Flu A&B, Covid) Anterior Nasal Swab     Status: None   Collection Time: 08/26/22  9:52 AM   Specimen: Anterior Nasal Swab  Result Value Ref Range Status   SARS Coronavirus 2 by RT PCR NEGATIVE NEGATIVE Final   Influenza A by PCR  NEGATIVE NEGATIVE Final   Influenza B by PCR NEGATIVE NEGATIVE Final    Comment: (NOTE) The Xpert Xpress SARS-CoV-2/FLU/RSV plus assay is intended as an aid in the diagnosis of influenza from Nasopharyngeal swab specimens and should not be used as a sole basis for treatment. Nasal washings and aspirates are unacceptable for Xpert Xpress SARS-CoV-2/FLU/RSV testing.  Fact Sheet for Patients: BloggerCourse.com  Fact Sheet for Healthcare Providers: SeriousBroker.it  This test is not yet approved or cleared by the [place] FDA and has been authorized for detection and/or diagnosis of SARS-CoV-2 by FDA under an Emergency Use Authorization (EUA). This EUA will remain in effect (meaning this test can be used) for the duration of the COVID-19 declaration under Section 564(b)(1) of the Act, 21 U.S.C. section 360bbb-3(b)(1), unless the authorization is terminated or revoked.     Resp Syncytial Virus by PCR NEGATIVE NEGATIVE Final    Comment: (NOTE) Fact Sheet for Patients: BloggerCourse.com  Fact Sheet for Healthcare Providers: SeriousBroker.it  This test is not yet approved or cleared by the [place] FDA and has been authorized for detection and/or diagnosis of SARS-CoV-2 by FDA under an Emergency Use Authorization (EUA). This EUA will remain in effect (meaning this test can be used) for the duration of the COVID-19 declaration under Section 564(b)(1) of the Act, 21 U.S.C. section 360bbb-3(b)(1), unless the authorization is terminated or revoked.  Performed at [facility], [address], [place], [place] [address]   Gram stain     Status: None   Collection Time: 08/26/22  2:10 PM   Specimen: Abdomen; Peritoneal Fluid  Result Value Ref Range Status   Specimen Description PERITONEAL  Final   Special Requests NONE  Final   Gram Stain   Final    ABUNDANT WBC  PRESENT,BOTH PMN AND MONONUCLEAR NO ORGANISMS SEEN Performed at [facility], [address] [place], [place] [address]    Report Status 08/26/2022  FINAL  Final  Culture, body fluid w Gram Stain-bottle     Status: None (Preliminary result)   Collection Time: 08/26/22  2:10 PM   Specimen: Peritoneal Washings  Result Value Ref Range Status   Specimen Description PERITONEAL  Final   Special Requests NONE  Final   Culture   Final    NO GROWTH 3 DAYS Performed at [facility], [address] [place], [place] [address]    Report Status PENDING  Incomplete  Urine Culture (for pregnant, neutropenic or urologic patients or patients with an indwelling urinary catheter)     Status: Abnormal   Collection Time: 08/27/22  5:15 AM   Specimen: Urine, Clean Catch  Result Value Ref Range Status   Specimen Description URINE, CLEAN CATCH  Final   Special Requests   Final    Normal Performed at [facility], [address], [place], [place] [address]    Culture MULTIPLE SPECIES PRESENT, SUGGEST RECOLLECTION (A)  Final   Report Status 08/29/2022 FINAL  Final         Radiology Studies: No results found.      Scheduled Meds:  acidophilus  2 capsule Oral TID   enoxaparin (LOVENOX) injection  40 mg Subcutaneous Q24H   feeding supplement  237 mL Oral BID BM   folic acid  1 mg Oral Daily   gabapentin  100 mg Oral TID   ketorolac  30 mg Intravenous Q6H   lidocaine (PF)  10 mL Intradermal Once   lipase/protease/amylase  12,000 Units Oral TID AC   multivitamin with minerals  1 tablet Oral Daily   pantoprazole  40 mg Oral BID AC   sucralfate  1 g Oral TID PC & HS   thiamine  100 mg Oral Daily   Continuous Infusions:  ceFEPime (MAXIPIME) IV Stopped (08/30/22 0203)     LOS: 3 days   Time spent:  Azucena Fallen, DO [facility]  If 7PM-7AM, please contact night-coverage [email or web address]  08/30/2022, 7:17 AM

## 2022-08-30 NOTE — Procedures (Signed)
PROCEDURE SUMMARY:  Successful image-guided paracentesis from the right lower abdomen.  Yielded 3 liters of yellow-red fluid.  No immediate complications.  EBL = trace. Patient tolerated well.   Specimen was sent for labs.  Please see imaging section of Epic for full dictation.   Kennieth Francois PA-C 08/30/2022 10:15 AM

## 2022-08-31 ENCOUNTER — Other Ambulatory Visit (HOSPITAL_COMMUNITY): Payer: MEDICAID

## 2022-08-31 DIAGNOSIS — R188 Other ascites: Secondary | ICD-10-CM | POA: Diagnosis not present

## 2022-08-31 DIAGNOSIS — K8689 Other specified diseases of pancreas: Secondary | ICD-10-CM

## 2022-08-31 DIAGNOSIS — K652 Spontaneous bacterial peritonitis: Secondary | ICD-10-CM | POA: Diagnosis not present

## 2022-08-31 DIAGNOSIS — K861 Other chronic pancreatitis: Secondary | ICD-10-CM | POA: Diagnosis not present

## 2022-08-31 DIAGNOSIS — E46 Unspecified protein-calorie malnutrition: Secondary | ICD-10-CM

## 2022-08-31 DIAGNOSIS — F1090 Alcohol use, unspecified, uncomplicated: Secondary | ICD-10-CM | POA: Diagnosis not present

## 2022-08-31 DIAGNOSIS — K709 Alcoholic liver disease, unspecified: Secondary | ICD-10-CM | POA: Diagnosis not present

## 2022-08-31 DIAGNOSIS — D500 Iron deficiency anemia secondary to blood loss (chronic): Secondary | ICD-10-CM | POA: Diagnosis not present

## 2022-08-31 LAB — CULTURE, BLOOD (ROUTINE X 2)
Culture: NO GROWTH
Special Requests: ADEQUATE

## 2022-08-31 LAB — COMPREHENSIVE METABOLIC PANEL
ALT: 12 U/L (ref 0–44)
AST: 30 U/L (ref 15–41)
Albumin: 2.4 g/dL — ABNORMAL LOW (ref 3.5–5.0)
Alkaline Phosphatase: 50 U/L (ref 38–126)
Anion gap: 10 (ref 5–15)
BUN: 16 mg/dL (ref 6–20)
CO2: 20 mmol/L — ABNORMAL LOW (ref 22–32)
Calcium: 8.4 mg/dL — ABNORMAL LOW (ref 8.9–10.3)
Chloride: 104 mmol/L (ref 98–111)
Creatinine, Ser: 0.9 mg/dL (ref 0.61–1.24)
GFR, Estimated: >60 mL/min (ref >60)
Glucose, Bld: 107 mg/dL — ABNORMAL HIGH (ref 70–99)
Potassium: 4.5 mmol/L (ref 3.5–5.1)
Sodium: 134 mmol/L — ABNORMAL LOW (ref 135–145)
Total Bilirubin: 1.7 mg/dL — ABNORMAL HIGH (ref 0.3–1.2)
Total Protein: 6 g/dL — ABNORMAL LOW (ref 6.5–8.1)

## 2022-08-31 LAB — CBC
HCT: 27.5 % — ABNORMAL LOW (ref 39.0–52.0)
Hemoglobin: 8.9 g/dL — ABNORMAL LOW (ref 13.0–17.0)
MCH: 28.1 pg (ref 26.0–34.0)
MCHC: 32.4 g/dL (ref 30.0–36.0)
MCV: 86.8 fL (ref 80.0–100.0)
Platelets: 705 10*3/uL — ABNORMAL HIGH (ref 150–400)
RBC: 3.17 MIL/uL — ABNORMAL LOW (ref 4.22–5.81)
RDW: 21.3 % — ABNORMAL HIGH (ref 11.5–15.5)
WBC: 10.1 10*3/uL (ref 4.0–10.5)
nRBC: 0 % (ref 0.0–0.2)

## 2022-08-31 LAB — CULTURE, BODY FLUID W GRAM STAIN -BOTTLE
Culture: NO GROWTH
Culture: NO GROWTH

## 2022-08-31 LAB — MAGNESIUM: Magnesium: 1.7 mg/dL (ref 1.7–2.4)

## 2022-08-31 LAB — PHOSPHORUS: Phosphorus: 3.7 mg/dL (ref 2.5–4.6)

## 2022-08-31 NOTE — Progress Notes (Signed)
PROGRESS NOTE    Samuel Blevins  UEA:[id number] DOB: [birth year] DOA: 08/26/2022 PCP: [name], MD   Brief Narrative:  57 year old with history of recurrent pancreatitis, chronic pancreatic insufficiency, HTN recurrent alcohol use comes to the hospital with complaints of abdominal pain, nausea, vomiting and diarrhea.  Patient was also found to have acute recurrent alcoholic pancreatitis.  Initially patient had left AMA about 4-5 days prior to this admission.    Assessment & Plan:   Principal Problem:   Sepsis (HCC) Active Problems:   Alcohol dependence syndrome (HCC)   Acute on chronic pancreatitis (HCC)   Pancreatitis   Acute pancreatitis   Other ascites   Protein-calorie malnutrition, severe   Alcoholic liver disease (HCC)   Pancreatic ascites   Recurrent alcohol induced pancreatitis. Nausea vomiting, resolved -Secondary to chronic alcohol use/abuse -Continue conservative management IV fluids, p.o. intake as tolerated, pain control avoiding IV narcotics, antiemetics, PPI   Decompensated liver cirrhosis with ascites Sepsis secondary to suspected SBP versus infected pancreatic ascites Hypoalbuminemia Hypovolemic hyponatremia -Sepsis evidenced by tachycardia and leukocytosis.  Source presumed pancreatic duct disruption(elevated amylase on paracentesis) -Paracentesis 7/1 - 3.1 L removed; para 7/5 - 3L withdrawn -Blood culture, paracentesis fluid culture remain negative -Urine culture shows multiple species, likely contaminant -Continue empiric cefepime -Continue octreotide -Possible need for ERCP/pancreatic duct stenting per GI -Core track placed 7/5 but remains proximal to the pylorus which is not ideal per discussion with GI.  Now appears to be clogged 7/6 -unable to clear after multiple attempts, will pull core track and transition to p.o. diet given patient's request and plan was for gastric core track tube feeds over the weekend.  On Monday plan is to place  core track post pylorus (beyond the second portion of the duodenum) in hopes to decrease pancreatic stimulation -at which time patient can be made n.p.o. if absolutely necessary.  Severe protein caloric malnutrition High risk for refeeding syndrome -Complicated by chronic alcohol use -Core track placed 7/5 -dysfunctional 7/6, given patient can take p.o. decision was made to remove core track until Monday when it can be placed post pylorus as above. -Continue to follow mag,phos, metabolic panel-replete as appropriate  Alcohol abuse - Alcohol withdrawal protocol completed, currently outside the window for any further withdrawal symptoms.  No further need for benzos IV versus p.o.    Essential hypertension -Currently well-controlled off medications, as needed hydralazine for hypertensive events   GERD/duodenitis -Continue PPI   DVT prophylaxis: enoxaparin (LOVENOX) injection 40 mg Start: 08/26/22 1700 Code Status:   Code Status: Full Code Family Communication: None present  Status is: Inpatient  Dispo: The patient is from: Home              Anticipated d/c is to: To be determined              Anticipated d/c date is: 48 to 72 hours              Patient currently not medically stable for discharge given ongoing need for IV fluids, antibiotics, antiemetics, benzodiazepines as well as analgesics  Consultants:  [facility]  Antimicrobials:  Cefepime  Subjective: Core track clogged overnight, unable to clear overnight or this morning with multiple attempts.  Patient requesting p.o. which is reasonable.  Otherwise denies nausea vomiting diarrhea constipation headache fevers chills or chest pain in the interim.  Objective: Vitals:   08/30/22 1549 08/30/22 2033 08/31/22 0444 08/31/22 0500  BP: 135/83 130/85 118/80   Pulse: (!) 111 (!)  108 (!) 103   Resp:  16 16   Temp: 97.9 F (36.6 C) 98.1 F (36.7 C) 99.4 F (37.4 C)   TempSrc:  Oral Oral   SpO2: 98% 99% 99%   Weight:    74.1  kg  Height:        Intake/Output Summary (Last 24 hours) at 08/31/2022 0646 Last data filed at 08/31/2022 0430 Gross per 24 hour  Intake 706 ml  Output 600 ml  Net 106 ml    Filed Weights   08/26/22 0838 08/30/22 1300 08/31/22 0500  Weight: 77.1 kg 74.2 kg 74.1 kg    Examination:  General:  Pleasantly resting in bed, No acute distress. HEENT:  Normocephalic atraumatic.  Sclerae nonicteric, noninjected.  Extraocular movements intact bilaterally.  Core track secured in place, clogged. Neck:  Without mass or deformity.  Trachea is midline. Lungs:  Clear to auscultate bilaterally without rhonchi, wheeze, or rales. Heart:  Regular rate and rhythm.  Without murmurs, rubs, or gallops. Abdomen:  Soft, moderately distended Extremities: Without cyanosis, clubbing, or obvious deformity. Skin:  Warm and dry, no erythema.   Data Reviewed: I have personally reviewed following labs and imaging studies  CBC: Recent Labs  Lab 08/26/22 0847 08/27/22 0431 08/28/22 0442 08/29/22 0220 08/30/22 0401 08/31/22 0421  WBC 17.1* 13.9* 13.2* 11.6* 11.1* 10.1  NEUTROABS 13.6*  --  10.9*  --   --   --   HGB 11.6* 9.2* 9.4* 8.2* 8.1* 8.9*  HCT 36.9* 28.4* 28.0* 25.3* 24.9* 27.5*  MCV 89.6 88.2 89.7 88.5 87.4 86.8  PLT 558* 535* 568* 578* 668* 705*    Basic Metabolic Panel: Recent Labs  Lab 08/27/22 0431 08/27/22 0752 08/28/22 0442 08/29/22 0220 08/30/22 0401 08/31/22 0421  NA  --  128* 128* 130* 131* 134*  K  --  3.0* 4.2 3.6 3.5 4.5  CL  --  95* 99 101 102 104  CO2  --  23 21* 20* 20* 20*  GLUCOSE  --  132* 103* 93 97 107*  BUN  --  13 16 16 16 16   CREATININE  --  1.13 0.92 0.95 0.99 0.90  CALCIUM  --  7.3* 7.4* 7.9* 8.3* 8.4*  MG 1.4*  --  1.7 1.7 1.7 1.7  PHOS  --   --   --  3.3  --  3.7    GFR: Estimated Creatinine Clearance: 89.5 mL/min (by C-G formula based on SCr of 0.9 mg/dL). Liver Function Tests: Recent Labs  Lab 08/26/22 0847 08/28/22 0442 08/29/22 0220  08/30/22 0401 08/31/22 0421  AST 30 22 19 21 30   ALT 13 11 9 9 12   ALKPHOS 69 48 45 48 50  BILITOT 0.8 1.1 1.1 1.2 1.7*  PROT 6.5 5.3* 5.8* 6.3* 6.0*  ALBUMIN 2.0* <1.5* 2.3* 2.8* 2.4*    Recent Labs  Lab 08/26/22 1050 08/27/22 0431 08/28/22 0442  LIPASE 445* 197* 384*    No results for input(s): "AMMONIA" in the last 168 hours. Coagulation Profile: Recent Labs  Lab 08/26/22 1002  INR 1.2    Sepsis Labs: Recent Labs  Lab 08/26/22 0847 08/26/22 1022 08/28/22 0442  LATICACIDVEN 2.1* 1.4 0.6     Recent Results (from the past 240 hour(s))  Body fluid culture w Gram Stain     Status: None   Collection Time: 08/21/22  2:53 PM   Specimen: PATH Cytology Peritoneal fluid  Result Value Ref Range Status   Specimen Description PERITONEAL  Final  Special Requests NONE  Final   Gram Stain   Final    FEW WBC PRESENT, PREDOMINANTLY PMN NO ORGANISMS SEEN    Culture   Final    NO GROWTH 3 DAYS Performed at [facility], [address], [place], [place] [address]    Report Status 08/24/2022 FINAL  Final  Blood culture (routine x 2)     Status: None (Preliminary result)   Collection Time: 08/26/22  8:47 AM   Specimen: BLOOD  Result Value Ref Range Status   Specimen Description BLOOD SITE NOT SPECIFIED  Final   Special Requests   Final    BOTTLES DRAWN AEROBIC AND ANAEROBIC Blood Culture results may not be optimal due to an inadequate volume of blood received in culture bottles   Culture   Final    NO GROWTH 4 DAYS Performed at [facility], [address], [place], [place] [address]    Report Status PENDING  Incomplete  Blood culture (routine x 2)     Status: None (Preliminary result)   Collection Time: 08/26/22  8:52 AM   Specimen: BLOOD  Result Value Ref Range Status   Specimen Description BLOOD SITE NOT SPECIFIED  Final   Special Requests   Final    BOTTLES DRAWN AEROBIC AND ANAEROBIC Blood Culture adequate volume   Culture   Final    NO GROWTH 4  DAYS Performed at [facility], [address], [place], [place] [address]    Report Status PENDING  Incomplete  Resp panel by RT-PCR (RSV, Flu A&B, Covid) Anterior Nasal Swab     Status: None   Collection Time: 08/26/22  9:52 AM   Specimen: Anterior Nasal Swab  Result Value Ref Range Status   SARS Coronavirus 2 by RT PCR NEGATIVE NEGATIVE Final   Influenza A by PCR NEGATIVE NEGATIVE Final   Influenza B by PCR NEGATIVE NEGATIVE Final    Comment: (NOTE) The Xpert Xpress SARS-CoV-2/FLU/RSV plus assay is intended as an aid in the diagnosis of influenza from Nasopharyngeal swab specimens and should not be used as a sole basis for treatment. Nasal washings and aspirates are unacceptable for Xpert Xpress SARS-CoV-2/FLU/RSV testing.  Fact Sheet for Patients: BloggerCourse.com  Fact Sheet for Healthcare Providers: SeriousBroker.it  This test is not yet approved or cleared by the [place] FDA and has been authorized for detection and/or diagnosis of SARS-CoV-2 by FDA under an Emergency Use Authorization (EUA). This EUA will remain in effect (meaning this test can be used) for the duration of the COVID-19 declaration under Section 564(b)(1) of the Act, 21 U.S.C. section 360bbb-3(b)(1), unless the authorization is terminated or revoked.     Resp Syncytial Virus by PCR NEGATIVE NEGATIVE Final    Comment: (NOTE) Fact Sheet for Patients: BloggerCourse.com  Fact Sheet for Healthcare Providers: SeriousBroker.it  This test is not yet approved or cleared by the [place] FDA and has been authorized for detection and/or diagnosis of SARS-CoV-2 by FDA under an Emergency Use Authorization (EUA). This EUA will remain in effect (meaning this test can be used) for the duration of the COVID-19 declaration under Section 564(b)(1) of the Act, 21 U.S.C. section 360bbb-3(b)(1), unless the  authorization is terminated or revoked.  Performed at [facility], [address], [place], [place] [address]   Gram stain     Status: None   Collection Time: 08/26/22  2:10 PM   Specimen: Abdomen; Peritoneal Fluid  Result Value Ref Range  Status   Specimen Description PERITONEAL  Final   Special Requests NONE  Final   Gram Stain   Final    ABUNDANT WBC PRESENT,BOTH PMN AND MONONUCLEAR NO ORGANISMS SEEN Performed at [facility], [address], [place], [place] [address]    Report Status 08/26/2022 FINAL  Final  Culture, body fluid w Gram Stain-bottle     Status: None (Preliminary result)   Collection Time: 08/26/22  2:10 PM   Specimen: Peritoneal Washings  Result Value Ref Range Status   Specimen Description PERITONEAL  Final   Special Requests NONE  Final   Culture   Final    NO GROWTH 4 DAYS Performed at [facility], [address] [place], [place] [address]    Report Status PENDING  Incomplete  Urine Culture (for pregnant, neutropenic or urologic patients or patients with an indwelling urinary catheter)     Status: Abnormal   Collection Time: 08/27/22  5:15 AM   Specimen: Urine, Clean Catch  Result Value Ref Range Status   Specimen Description URINE, CLEAN CATCH  Final   Special Requests   Final    Normal Performed at [facility], [address] [place], [place] [address]    Culture MULTIPLE SPECIES PRESENT, SUGGEST RECOLLECTION (A)  Final   Report Status 08/29/2022 FINAL  Final  Gram stain     Status: None   Collection Time: 08/30/22  9:41 AM   Specimen: Abdomen  Result Value Ref Range Status   Specimen Description ABDOMEN  Final   Special Requests PERITONEAL  Final   Gram Stain   Final    WBC PRESENT, PREDOMINANTLY PMN NO ORGANISMS SEEN CYTOSPIN SMEAR Performed at [facility], [address] [place], [place] [address]    Report Status 08/30/2022 FINAL  Final         Radiology Studies: MR ABDOMEN MRCP W WO  CONTAST  Result Date: 08/30/2022 CLINICAL DATA:  Chronic pancreatitis, new ascites, evaluate for pancreatic duct disruption EXAM: MRI ABDOMEN WITHOUT AND WITH CONTRAST (INCLUDING MRCP) TECHNIQUE: Multiplanar multisequence MR imaging of the abdomen was performed both before and after the administration of intravenous contrast. Heavily T2-weighted images of the biliary and pancreatic ducts were obtained, and three-dimensional MRCP images were rendered by post processing. CONTRAST:  7mL GADAVIST GADOBUTROL 1 MMOL/ML IV SOLN COMPARISON:  CT abdomen pelvis, 08/26/2022 FINDINGS: Examination is significantly limited by pervasive breath motion artifact throughout. Lower chest: Small bilateral pleural effusions Hepatobiliary: Hepatomegaly, maximum coronal span 20.2 cm. Mild hepatic steatosis, assessment is significantly limited by breath motion artifact on in and opposed phase imaging. No solid liver abnormality is seen. No gallstones. Gallbladder wall thickening, nonspecific in the setting of ascites. No biliary ductal dilatation. Pancreas: Pancreas divisum. No pancreatic ductal dilatation. No specific evidence of acute pancreatitis. Spleen: Normal in size without significant abnormality. Adrenals/Urinary Tract: Adrenal glands are unremarkable. Kidneys are normal, without renal calculi, solid lesion, or hydronephrosis. Stomach/Bowel: Stomach is within normal limits. Thickening of multiple loops of decompressed small bowel in the central abdomen (series 5, image 38). Vascular/Lymphatic: No significant vascular findings are present. No enlarged abdominal lymph nodes. Other: No abdominal wall hernia or abnormality. Moderate volume ascites throughout the abdomen and pelvis. Musculoskeletal: No acute or significant osseous findings. IMPRESSION: 1. Pancreas divisum. No pancreatic ductal dilatation. No specific evidence of acute pancreatitis. 2. Thickening of multiple loops of decompressed small bowel in the central abdomen,  suggestive of nonspecific enteritis. 3. Moderate volume  ascites throughout the abdomen and pelvis. Small bilateral pleural effusions. 4. Hepatomegaly and mild hepatic steatosis. Electronically Signed   By: Jearld Lesch M.D.   On: 08/30/2022 19:54   DG Abd Portable 1V  Result Date: 08/30/2022 CLINICAL DATA:  Feeding tube placement. EXAM: PORTABLE ABDOMEN - 1 VIEW COMPARISON:  08/30/2022 at 2:15 p.m. FINDINGS: Enteric feeding tube has been further inserted. Tube curls in the right mid abdomen, expected location of the distal stomach, metallic tip in the left upper quadrant consistent with the gastric fundus. IMPRESSION: Enteric feeding tube position as detailed above. Electronically Signed   By: Amie Portland M.D.   On: 08/30/2022 16:24   DG Abd Portable 1V  Result Date: 08/30/2022 CLINICAL DATA:  Feeding tube placement. EXAM: PORTABLE ABDOMEN - 1 VIEW COMPARISON:  None Available. FINDINGS: Tip of the weighted enteric tube in the right upper quadrant in the region of the distal stomach or proximal duodenum. No bowel dilatation to suggest obstruction IMPRESSION: Tip of the weighted enteric tube in the right upper quadrant in the region of the distal stomach or proximal duodenum. Electronically Signed   By: Narda Rutherford M.D.   On: 08/30/2022 14:42   IR Paracentesis  Result Date: 08/30/2022 INDICATION: Patient with a history of alcohol abuse, chronic pancreatitis admitted for acute pancreatitis and found to have new onset ascites. Request received to perform diagnostic and therapeutic paracentesis. EXAM: ULTRASOUND GUIDED DIAGNOSTIC AND THERAPEUTIC PARACENTESIS MEDICATIONS: 8 cc 1% lidocaine COMPLICATIONS: None immediate. PROCEDURE: Informed written consent was obtained from the patient after a discussion of the risks, benefits and alternatives to treatment. A timeout was performed prior to the initiation of the procedure. Initial ultrasound scanning demonstrates a moderate amount of ascites within the  right lower abdominal quadrant. The right lower abdomen was prepped and draped in the usual sterile fashion. 1% lidocaine was used for local anesthesia. Following this, a 19 gauge, 7-cm, Yueh catheter was introduced. An ultrasound image was saved for documentation purposes. The paracentesis was performed. The catheter was removed and a dressing was applied. The patient tolerated the procedure well without immediate post procedural complication. FINDINGS: A total of approximately 3 L of yellow-red fluid was removed. Samples were sent to the laboratory as requested by the clinical team. IMPRESSION: Successful ultrasound-guided paracentesis yielding 3 liters of peritoneal fluid. Procedure performed by Mina Marble, PA-C Electronically Signed   By: Roanna Banning M.D.   On: 08/30/2022 12:26        Scheduled Meds:  acidophilus  2 capsule Oral TID   enoxaparin (LOVENOX) injection  40 mg Subcutaneous Q24H   feeding supplement  237 mL Oral BID BM   feeding supplement (PROSource TF20)  60 mL Per Tube Daily   folic acid  1 mg Oral Daily   gabapentin  100 mg Oral TID   ketorolac  30 mg Intravenous Q6H   lidocaine (PF)  10 mL Intradermal Once   lipase/protease/amylase  12,000 Units Oral TID AC   multivitamin with minerals  1 tablet Oral Daily   octreotide  100 mcg Subcutaneous Q12H   pantoprazole  40 mg Oral BID AC   sucralfate  1 g Oral TID PC & HS   thiamine  100 mg Oral Daily   Continuous Infusions:  ceFEPime (MAXIPIME) IV 200 mL/hr at 08/31/22 0332   feeding supplement (VITAL 1.5 CAL) 55 mL/hr at 08/31/22 0332     LOS: 4 days   Time spent:  Azucena Fallen, DO [facility]  If 7PM-7AM, please contact night-coverage [email or web address]  08/31/2022, 6:46 AM

## 2022-08-31 NOTE — Progress Notes (Cosign Needed Addendum)
Daily Progress Note  DOA: 08/26/2022 Hospital Day: 6 Chief Complaint: infected pancreatic ascites    Assessment and Plan:    Brief Narrative:  Samuel Blevins is a 57 y.o. year old male with a past medical history not limited to chronic pancreatitis, etoh abuse, admitted with persistent / recurrent infected peritonitis. Previously, SAAG was not compatible with portal hypertension which now makes since as this is pancreatic ascites with large amount of amylase in ascitic fluid.       Chronic pancreatitis, now with infected pancreatic ascites likely from pancreatic duct disruption.  Ascitic fluid with  > 10,000 u/L amylase.  -- Ascitic fluid culture from LVP done in ED was negative. Repeat 3 L paracentesis 7/5 showed no improvement in cell count ( PMNs 3200 with 77% Neutrophils).  Culture pending.  Acid fast culture pending. Continue Maxipime. May need formal ID consult at some point but will hold on that for now. -- MRI / MRCP yesterday was a limited study due to breath motion artifact but findings included pancreatic divisum, no pancreatic dilation but we still cannot exclude PD disruption --Continue Octreotide 100 mg BID, may escalate dose at some point if Echo is okay --2D Echo scheduled for today --May need ERCP with pancreatic duct stenting at some point.   --Cortrak is clogged, RN working to clear tubing. Cortrak team to advance past pylorus next week. For now, gastric feedings okay.   Thickening of multiple small bowel loops on MRI.  --Findings suggest enteritis but he doesn't exhibit symptoms      Subjective / New Events:   Still has generalized abdominal pain . No other complaints at the moment.    Objective:   Recent Labs    08/29/22 0220 08/30/22 0401 08/31/22 0421  WBC 11.6* 11.1* 10.1  HGB 8.2* 8.1* 8.9*  HCT 25.3* 24.9* 27.5*  PLT 578* 668* 705*   BMET Recent Labs    08/29/22 0220 08/30/22 0401 08/31/22 0421  NA 130* 131* 134*  K 3.6 3.5 4.5  CL  101 102 104  CO2 20* 20* 20*  GLUCOSE 93 97 107*  BUN 16 16 16   CREATININE 0.95 0.99 0.90  CALCIUM 7.9* 8.3* 8.4*   LFT Recent Labs    08/31/22 0421  PROT 6.0*  ALBUMIN 2.4*  AST 30  ALT 12  ALKPHOS 50  BILITOT 1.7*   PT/INR No results for input(s): "LABPROT", "INR" in the last 72 hours.   Imaging:  MR ABDOMEN MRCP W WO CONTAST CLINICAL DATA:  Chronic pancreatitis, new ascites, evaluate for pancreatic duct disruption  EXAM: MRI ABDOMEN WITHOUT AND WITH CONTRAST (INCLUDING MRCP)  TECHNIQUE: Multiplanar multisequence MR imaging of the abdomen was performed both before and after the administration of intravenous contrast. Heavily T2-weighted images of the biliary and pancreatic ducts were obtained, and three-dimensional MRCP images were rendered by post processing.  CONTRAST:  7mL GADAVIST GADOBUTROL 1 MMOL/ML IV SOLN  COMPARISON:  CT abdomen pelvis, 08/26/2022  FINDINGS: Examination is significantly limited by pervasive breath motion artifact throughout.  Lower chest: Small bilateral pleural effusions  Hepatobiliary: Hepatomegaly, maximum coronal span 20.2 cm. Mild hepatic steatosis, assessment is significantly limited by breath motion artifact on in and opposed phase imaging. No solid liver abnormality is seen. No gallstones. Gallbladder wall thickening, nonspecific in the setting of ascites. No biliary ductal dilatation.  Pancreas: Pancreas divisum. No pancreatic ductal dilatation. No specific evidence of acute pancreatitis.  Spleen: Normal in size without significant abnormality.  Adrenals/Urinary  Tract: Adrenal glands are unremarkable. Kidneys are normal, without renal calculi, solid lesion, or hydronephrosis.  Stomach/Bowel: Stomach is within normal limits. Thickening of multiple loops of decompressed small bowel in the central abdomen (series 5, image 38).  Vascular/Lymphatic: No significant vascular findings are present. No enlarged abdominal  lymph nodes.  Other: No abdominal wall hernia or abnormality. Moderate volume ascites throughout the abdomen and pelvis.  Musculoskeletal: No acute or significant osseous findings.  IMPRESSION: 1. Pancreas divisum. No pancreatic ductal dilatation. No specific evidence of acute pancreatitis. 2. Thickening of multiple loops of decompressed small bowel in the central abdomen, suggestive of nonspecific enteritis. 3. Moderate volume ascites throughout the abdomen and pelvis. Small bilateral pleural effusions. 4. Hepatomegaly and mild hepatic steatosis.  Electronically Signed   By: Jearld Lesch M.D.   On: 08/30/2022 19:54 DG Abd Portable 1V CLINICAL DATA:  Feeding tube placement.  EXAM: PORTABLE ABDOMEN - 1 VIEW  COMPARISON:  08/30/2022 at 2:15 p.m.  FINDINGS: Enteric feeding tube has been further inserted. Tube curls in the right mid abdomen, expected location of the distal stomach, metallic tip in the left upper quadrant consistent with the gastric fundus.  IMPRESSION: Enteric feeding tube position as detailed above.  Electronically Signed   By: Amie Portland M.D.   On: 08/30/2022 16:24 DG Abd Portable 1V CLINICAL DATA:  Feeding tube placement.  EXAM: PORTABLE ABDOMEN - 1 VIEW  COMPARISON:  None Available.  FINDINGS: Tip of the weighted enteric tube in the right upper quadrant in the region of the distal stomach or proximal duodenum. No bowel dilatation to suggest obstruction  IMPRESSION: Tip of the weighted enteric tube in the right upper quadrant in the region of the distal stomach or proximal duodenum.  Electronically Signed   By: Narda Rutherford M.D.   On: 08/30/2022 14:42 IR Paracentesis INDICATION: Patient with a history of alcohol abuse, chronic pancreatitis admitted for acute pancreatitis and found to have new onset ascites. Request received to perform diagnostic and therapeutic paracentesis.  EXAM: ULTRASOUND GUIDED DIAGNOSTIC AND THERAPEUTIC  PARACENTESIS  MEDICATIONS: 8 cc 1% lidocaine  COMPLICATIONS: None immediate.  PROCEDURE: Informed written consent was obtained from the patient after a discussion of the risks, benefits and alternatives to treatment. A timeout was performed prior to the initiation of the procedure.  Initial ultrasound scanning demonstrates a moderate amount of ascites within the right lower abdominal quadrant. The right lower abdomen was prepped and draped in the usual sterile fashion. 1% lidocaine was used for local anesthesia.  Following this, a 19 gauge, 7-cm, Yueh catheter was introduced. An ultrasound image was saved for documentation purposes. The paracentesis was performed. The catheter was removed and a dressing was applied. The patient tolerated the procedure well without immediate post procedural complication.  FINDINGS: A total of approximately 3 L of yellow-red fluid was removed. Samples were sent to the laboratory as requested by the clinical team.  IMPRESSION: Successful ultrasound-guided paracentesis yielding 3 liters of peritoneal fluid.  Procedure performed by Mina Marble, PA-C  Electronically Signed   By: Roanna Banning M.D.   On: 08/30/2022 12:26     Scheduled inpatient medications:   acidophilus  2 capsule Oral TID   enoxaparin (LOVENOX) injection  40 mg Subcutaneous Q24H   feeding supplement  237 mL Oral BID BM   feeding supplement (PROSource TF20)  60 mL Per Tube Daily   folic acid  1 mg Oral Daily   gabapentin  100 mg Oral TID   ketorolac  30 mg Intravenous Q6H   lidocaine (PF)  10 mL Intradermal Once   lipase/protease/amylase  12,000 Units Oral TID AC   multivitamin with minerals  1 tablet Oral Daily   octreotide  100 mcg Subcutaneous Q12H   pantoprazole  40 mg Oral BID AC   sucralfate  1 g Oral TID PC & HS   thiamine  100 mg Oral Daily   Continuous inpatient infusions:   ceFEPime (MAXIPIME) IV 200 mL/hr at 08/31/22 0332   feeding supplement (VITAL  1.5 CAL) 55 mL/hr at 08/31/22 0332   PRN inpatient medications: acetaminophen, guaiFENesin, hydrALAZINE, hydrOXYzine, ipratropium-albuterol, metoprolol tartrate, ondansetron (ZOFRAN) IV, oxyCODONE, senna-docusate, traZODone  Vital signs in last 24 hours: Temp:  [97.8 F (36.6 C)-99.4 F (37.4 C)] 97.8 F (36.6 C) (07/06 0729) Pulse Rate:  [100-111] 100 (07/06 0729) Resp:  [16-18] 18 (07/06 0729) BP: (118-135)/(66-85) 129/79 (07/06 0729) SpO2:  [98 %-100 %] 100 % (07/06 0729) Weight:  [74.1 kg-74.2 kg] 74.1 kg (07/06 0500) Last BM Date : 08/27/22  Intake/Output Summary (Last 24 hours) at 08/31/2022 0851 Last data filed at 08/31/2022 0430 Gross per 24 hour  Intake 706 ml  Output 200 ml  Net 506 ml    Intake/Output from previous day: 07/05 0701 - 07/06 0700 In: 706 [NG/GT:406; IV Piggyback:300] Out: 600 [Urine:600] Intake/Output this shift: No intake/output data recorded.   Physical Exam:  General: Alert male in NAD Heart:  Regular rate and rhythm.  Pulmonary: Normal respiratory effort Abdomen: Soft, moderately distended, nontender. Normal bowel sounds. Neurologic: Alert and oriented Psych: Pleasant. Cooperative. Insight appears normal.    Principal Problem:   Sepsis (HCC) Active Problems:   Alcohol dependence syndrome (HCC)   Acute on chronic pancreatitis (HCC)   Pancreatitis   Acute pancreatitis   Other ascites   Protein-calorie malnutrition, severe   Alcoholic liver disease (HCC)   Pancreatic ascites     LOS: 4 days   Willette Cluster ,NP 08/31/2022, 8:51 AM

## 2022-09-01 ENCOUNTER — Inpatient Hospital Stay (HOSPITAL_COMMUNITY): Payer: MEDICAID

## 2022-09-01 DIAGNOSIS — K652 Spontaneous bacterial peritonitis: Secondary | ICD-10-CM | POA: Diagnosis not present

## 2022-09-01 DIAGNOSIS — K8689 Other specified diseases of pancreas: Secondary | ICD-10-CM | POA: Diagnosis not present

## 2022-09-01 DIAGNOSIS — I509 Heart failure, unspecified: Secondary | ICD-10-CM

## 2022-09-01 DIAGNOSIS — F1090 Alcohol use, unspecified, uncomplicated: Secondary | ICD-10-CM | POA: Diagnosis not present

## 2022-09-01 DIAGNOSIS — D500 Iron deficiency anemia secondary to blood loss (chronic): Secondary | ICD-10-CM | POA: Diagnosis not present

## 2022-09-01 DIAGNOSIS — R601 Generalized edema: Secondary | ICD-10-CM

## 2022-09-01 DIAGNOSIS — K709 Alcoholic liver disease, unspecified: Secondary | ICD-10-CM | POA: Diagnosis not present

## 2022-09-01 DIAGNOSIS — R188 Other ascites: Secondary | ICD-10-CM | POA: Diagnosis not present

## 2022-09-01 DIAGNOSIS — K861 Other chronic pancreatitis: Secondary | ICD-10-CM | POA: Diagnosis not present

## 2022-09-01 LAB — CBC
HCT: 26.7 % — ABNORMAL LOW (ref 39.0–52.0)
Hemoglobin: 8.7 g/dL — ABNORMAL LOW (ref 13.0–17.0)
MCH: 28.6 pg (ref 26.0–34.0)
MCHC: 32.6 g/dL (ref 30.0–36.0)
MCV: 87.8 fL (ref 80.0–100.0)
Platelets: 847 10*3/uL — ABNORMAL HIGH (ref 150–400)
RBC: 3.04 MIL/uL — ABNORMAL LOW (ref 4.22–5.81)
RDW: 21.2 % — ABNORMAL HIGH (ref 11.5–15.5)
WBC: 8.9 10*3/uL (ref 4.0–10.5)
nRBC: 0 % (ref 0.0–0.2)

## 2022-09-01 LAB — ECHOCARDIOGRAM COMPLETE
Area-P 1/2: 3.33 cm2
Height: 69 in
S' Lateral: 2.4 cm
Weight: 2613.77 oz

## 2022-09-01 LAB — COMPREHENSIVE METABOLIC PANEL
ALT: 15 U/L (ref 0–44)
AST: 28 U/L (ref 15–41)
Albumin: 2.1 g/dL — ABNORMAL LOW (ref 3.5–5.0)
Alkaline Phosphatase: 54 U/L (ref 38–126)
Anion gap: 7 (ref 5–15)
BUN: 18 mg/dL (ref 6–20)
CO2: 21 mmol/L — ABNORMAL LOW (ref 22–32)
Calcium: 8.3 mg/dL — ABNORMAL LOW (ref 8.9–10.3)
Chloride: 105 mmol/L (ref 98–111)
Creatinine, Ser: 1.04 mg/dL (ref 0.61–1.24)
GFR, Estimated: >60 mL/min (ref >60)
Glucose, Bld: 118 mg/dL — ABNORMAL HIGH (ref 70–99)
Potassium: 4.5 mmol/L (ref 3.5–5.1)
Sodium: 133 mmol/L — ABNORMAL LOW (ref 135–145)
Total Bilirubin: 0.8 mg/dL (ref 0.3–1.2)
Total Protein: 6.2 g/dL — ABNORMAL LOW (ref 6.5–8.1)

## 2022-09-01 LAB — MAGNESIUM: Magnesium: 1.7 mg/dL (ref 1.7–2.4)

## 2022-09-01 LAB — PHOSPHORUS: Phosphorus: 3.3 mg/dL (ref 2.5–4.6)

## 2022-09-01 NOTE — Progress Notes (Signed)
PROGRESS NOTE    Samuel Blevins  UJW:[id number] DOB: Dec 16, 1965 DOA: 08/26/2022 PCP: [name], MD   Brief Narrative:  57 year old with history of recurrent pancreatitis, chronic pancreatic insufficiency, HTN recurrent alcohol use comes to the hospital with complaints of abdominal pain, nausea, vomiting and diarrhea.  Patient was also found to have acute recurrent alcoholic pancreatitis.  Initially patient had left AMA about 4-5 days prior to this admission.  Assessment & Plan:   Principal Problem:   Sepsis (HCC) Active Problems:   Alcohol dependence syndrome (HCC)   Acute on chronic pancreatitis (HCC)   Pancreatitis   Acute pancreatitis   Other ascites   Protein-calorie malnutrition, severe   Alcoholic liver disease (HCC)   Pancreatic ascites   Anasarca  Recurrent alcohol induced pancreatitis. Nausea vomiting, resolved -Secondary to chronic alcohol use/abuse -Continue conservative management IV fluids, p.o. intake as tolerated, pain control avoiding IV narcotics, antiemetics, PPI  Decompensated liver cirrhosis with ascites Sepsis secondary to suspected SBP versus infected pancreatic ascites Hypoalbuminemia Hypovolemic hyponatremia -Sepsis evidenced by tachycardia and leukocytosis.  Source presumed pancreatic duct disruption(elevated amylase on paracentesis) -Paracentesis 7/1 - 3.1 L removed; para 7/5 - 3L withdrawn -Blood culture, paracentesis fluid culture remain negative -Urine culture shows multiple species, likely contaminant -Continue empiric cefepime -Continue octreotide -Echo: EF 60-65% with normal LVF and grade 1 diastolic dysfunction -Possible need for ERCP/pancreatic duct stenting per GI -Core track placed 7/5 Removed 7/6 as it was nonfunctional  Tolerating PO well, without discomfort/pain -Plan 7/8 for cortrak to be placed beyond the second portion of the duodenum to decrease pancreatic stimulation/output.Currently refusing cortrak  placement.  Severe protein caloric malnutrition High risk for refeeding syndrome -Complicated by chronic alcohol use -Core track placed 7/5 -dysfunctional 7/6, given patient can take p.o. decision was made to remove core track until Monday when it can be placed post pylorus as above. -Continue to follow mag,phos, metabolic panel-replete as appropriate  Alcohol abuse - Alcohol withdrawal protocol completed, currently outside the window for any further withdrawal symptoms.  No further need for benzos IV versus p.o.    Essential hypertension -Currently well-controlled off medications, as needed hydralazine for hypertensive events   GERD/duodenitis -Continue PPI   DVT prophylaxis: enoxaparin (LOVENOX) injection 40 mg Start: 08/26/22 1700 Code Status:   Code Status: Full Code Family Communication: None present  Status is: Inpatient  Dispo: The patient is from: Home              Anticipated d/c is to: To be determined              Anticipated d/c date is: 48 to 72 hours              Patient currently not medically stable for discharge given ongoing need for IV fluids, antibiotics, antiemetics, benzodiazepines as well as analgesics  Consultants:  [facility]  Antimicrobials:  Cefepime  Subjective: No acute issues/events overnight, continues to tolerate PO well without intractable nausea, vomiting, pain.  Objective: Vitals:   08/31/22 0729 08/31/22 1659 08/31/22 1927 09/01/22 0528  BP: 129/79 135/84 125/83 124/79  Pulse: 100 (!) 103 99 100  Resp: 18 18 16 16   Temp: 97.8 F (36.6 C) 98.8 F (37.1 C) 98.7 F (37.1 C) 99.1 F (37.3 C)  TempSrc:      SpO2: 100% 98% 98% 100%  Weight:    74.1 kg  Height:        Intake/Output Summary (Last 24 hours) at 09/01/2022 5621 Last data filed  at 09/01/2022 0600 Gross per 24 hour  Intake 360 ml  Output 950 ml  Net -590 ml    Filed Weights   08/30/22 1300 08/31/22 0500 09/01/22 0528  Weight: 74.2 kg 74.1 kg 74.1 kg     Examination:  General:  Pleasantly resting in bed, No acute distress. HEENT:  Normocephalic atraumatic.  Sclerae nonicteric, noninjected.  Extraocular movements intact bilaterally.  Core track secured in place, clogged. Neck:  Without mass or deformity.  Trachea is midline. Lungs:  Clear to auscultate bilaterally without rhonchi, wheeze, or rales. Heart:  Regular rate and rhythm.  Without murmurs, rubs, or gallops. Abdomen:  Soft, moderately distended Extremities: Without cyanosis, clubbing, or obvious deformity. Skin:  Warm and dry, no erythema.   Data Reviewed: I have personally reviewed following labs and imaging studies  CBC: Recent Labs  Lab 08/26/22 0847 08/27/22 0431 08/28/22 0442 08/29/22 0220 08/30/22 0401 08/31/22 0421 09/01/22 0543  WBC 17.1*   < > 13.2* 11.6* 11.1* 10.1 8.9  NEUTROABS 13.6*  --  10.9*  --   --   --   --   HGB 11.6*   < > 9.4* 8.2* 8.1* 8.9* 8.7*  HCT 36.9*   < > 28.0* 25.3* 24.9* 27.5* 26.7*  MCV 89.6   < > 89.7 88.5 87.4 86.8 87.8  PLT 558*   < > 568* 578* 668* 705* 847*   < > = values in this interval not displayed.    Basic Metabolic Panel: Recent Labs  Lab 08/28/22 0442 08/29/22 0220 08/30/22 0401 08/31/22 0421 09/01/22 0543  NA 128* 130* 131* 134* 133*  K 4.2 3.6 3.5 4.5 4.5  CL 99 101 102 104 105  CO2 21* 20* 20* 20* 21*  GLUCOSE 103* 93 97 107* 118*  BUN 16 16 16 16 18   CREATININE 0.92 0.95 0.99 0.90 1.04  CALCIUM 7.4* 7.9* 8.3* 8.4* 8.3*  MG 1.7 1.7 1.7 1.7 1.7  PHOS  --  3.3  --  3.7 3.3    GFR: Estimated Creatinine Clearance: 77.4 mL/min (by C-G formula based on SCr of 1.04 mg/dL). Liver Function Tests: Recent Labs  Lab 08/28/22 0442 08/29/22 0220 08/30/22 0401 08/31/22 0421 09/01/22 0543  AST 22 19 21 30 28   ALT 11 9 9 12 15   ALKPHOS 48 45 48 50 54  BILITOT 1.1 1.1 1.2 1.7* 0.8  PROT 5.3* 5.8* 6.3* 6.0* 6.2*  ALBUMIN <1.5* 2.3* 2.8* 2.4* 2.1*    Recent Labs  Lab 08/26/22 1050 08/27/22 0431  08/28/22 0442  LIPASE 445* 197* 384*    No results for input(s): "AMMONIA" in the last 168 hours. Coagulation Profile: Recent Labs  Lab 08/26/22 1002  INR 1.2    Sepsis Labs: Recent Labs  Lab 08/26/22 0847 08/26/22 1022 08/28/22 0442  LATICACIDVEN 2.1* 1.4 0.6     Recent Results (from the past 240 hour(s))  Blood culture (routine x 2)     Status: None   Collection Time: 08/26/22  8:47 AM   Specimen: BLOOD  Result Value Ref Range Status   Specimen Description BLOOD SITE NOT SPECIFIED  Final   Special Requests   Final    BOTTLES DRAWN AEROBIC AND ANAEROBIC Blood Culture results may not be optimal due to an inadequate volume of blood received in culture bottles   Culture   Final    NO GROWTH 5 DAYS Performed at [facility], [address] [place], [place] [address]    Report Status 08/31/2022  FINAL  Final  Blood culture (routine x 2)     Status: None   Collection Time: 08/26/22  8:52 AM   Specimen: BLOOD  Result Value Ref Range Status   Specimen Description BLOOD SITE NOT SPECIFIED  Final   Special Requests   Final    BOTTLES DRAWN AEROBIC AND ANAEROBIC Blood Culture adequate volume   Culture   Final    NO GROWTH 5 DAYS Performed at [facility], [address] [place], [place] [address]    Report Status 08/31/2022 FINAL  Final  Resp panel by RT-PCR (RSV, Flu A&B, Covid) Anterior Nasal Swab     Status: None   Collection Time: 08/26/22  9:52 AM   Specimen: Anterior Nasal Swab  Result Value Ref Range Status   SARS Coronavirus 2 by RT PCR NEGATIVE NEGATIVE Final   Influenza A by PCR NEGATIVE NEGATIVE Final   Influenza B by PCR NEGATIVE NEGATIVE Final    Comment: (NOTE) The Xpert Xpress SARS-CoV-2/FLU/RSV plus assay is intended as an aid in the diagnosis of influenza from Nasopharyngeal swab specimens and should not be used as a sole basis for treatment. Nasal washings and aspirates are unacceptable for Xpert Xpress  SARS-CoV-2/FLU/RSV testing.  Fact Sheet for Patients: BloggerCourse.com  Fact Sheet for Healthcare Providers: SeriousBroker.it  This test is not yet approved or cleared by the [place] FDA and has been authorized for detection and/or diagnosis of SARS-CoV-2 by FDA under an Emergency Use Authorization (EUA). This EUA will remain in effect (meaning this test can be used) for the duration of the COVID-19 declaration under Section 564(b)(1) of the Act, 21 U.S.C. section 360bbb-3(b)(1), unless the authorization is terminated or revoked.     Resp Syncytial Virus by PCR NEGATIVE NEGATIVE Final    Comment: (NOTE) Fact Sheet for Patients: BloggerCourse.com  Fact Sheet for Healthcare Providers: SeriousBroker.it  This test is not yet approved or cleared by the [place] FDA and has been authorized for detection and/or diagnosis of SARS-CoV-2 by FDA under an Emergency Use Authorization (EUA). This EUA will remain in effect (meaning this test can be used) for the duration of the COVID-19 declaration under Section 564(b)(1) of the Act, 21 U.S.C. section 360bbb-3(b)(1), unless the authorization is terminated or revoked.  Performed at [facility], [address] [place], [place] [address]   Gram stain     Status: None   Collection Time: 08/26/22  2:10 PM   Specimen: Abdomen; Peritoneal Fluid  Result Value Ref Range Status   Specimen Description PERITONEAL  Final   Special Requests NONE  Final   Gram Stain   Final    ABUNDANT WBC PRESENT,BOTH PMN AND MONONUCLEAR NO ORGANISMS SEEN Performed at [facility], [address] [place], [place] [address]    Report Status 08/26/2022 FINAL  Final  Culture, body fluid w Gram Stain-bottle     Status: None   Collection Time: 08/26/22  2:10 PM   Specimen: Peritoneal Washings  Result Value Ref Range Status   Specimen  Description PERITONEAL  Final   Special Requests NONE  Final   Culture   Final    NO GROWTH 5 DAYS Performed at [facility], [address], [place], [place] [address]    Report Status 08/31/2022 FINAL  Final  Urine Culture (for pregnant, neutropenic or urologic patients or patients with an indwelling urinary catheter)     Status: Abnormal   Collection Time: 08/27/22  5:15  AM   Specimen: Urine, Clean Catch  Result Value Ref Range Status   Specimen Description URINE, CLEAN CATCH  Final   Special Requests   Final    Normal Performed at [facility], [address] [place], [place] [address]    Culture MULTIPLE SPECIES PRESENT, SUGGEST RECOLLECTION (A)  Final   Report Status 08/29/2022 FINAL  Final  Culture, body fluid w Gram Stain-bottle     Status: None (Preliminary result)   Collection Time: 08/30/22  9:41 AM   Specimen: Abdomen  Result Value Ref Range Status   Specimen Description ABDOMEN  Final   Special Requests PERITONEAL  Final   Culture   Final    NO GROWTH < 24 HOURS Performed at [facility], [address], [place], [place] [address]    Report Status PENDING  Incomplete  Gram stain     Status: None   Collection Time: 08/30/22  9:41 AM   Specimen: Abdomen  Result Value Ref Range Status   Specimen Description ABDOMEN  Final   Special Requests PERITONEAL  Final   Gram Stain   Final    WBC PRESENT, PREDOMINANTLY PMN NO ORGANISMS SEEN CYTOSPIN SMEAR Performed at [facility], [address], [place], [place] [address]    Report Status 08/30/2022 FINAL  Final         Radiology Studies: MR ABDOMEN MRCP W WO CONTAST  Result Date: 08/30/2022 CLINICAL DATA:  Chronic pancreatitis, new ascites, evaluate for pancreatic duct disruption EXAM: MRI ABDOMEN WITHOUT AND WITH CONTRAST (INCLUDING MRCP) TECHNIQUE: Multiplanar multisequence MR imaging of the abdomen was performed both before and after the administration of intravenous contrast. Heavily  T2-weighted images of the biliary and pancreatic ducts were obtained, and three-dimensional MRCP images were rendered by post processing. CONTRAST:  7mL GADAVIST GADOBUTROL 1 MMOL/ML IV SOLN COMPARISON:  CT abdomen pelvis, 08/26/2022 FINDINGS: Examination is significantly limited by pervasive breath motion artifact throughout. Lower chest: Small bilateral pleural effusions Hepatobiliary: Hepatomegaly, maximum coronal span 20.2 cm. Mild hepatic steatosis, assessment is significantly limited by breath motion artifact on in and opposed phase imaging. No solid liver abnormality is seen. No gallstones. Gallbladder wall thickening, nonspecific in the setting of ascites. No biliary ductal dilatation. Pancreas: Pancreas divisum. No pancreatic ductal dilatation. No specific evidence of acute pancreatitis. Spleen: Normal in size without significant abnormality. Adrenals/Urinary Tract: Adrenal glands are unremarkable. Kidneys are normal, without renal calculi, solid lesion, or hydronephrosis. Stomach/Bowel: Stomach is within normal limits. Thickening of multiple loops of decompressed small bowel in the central abdomen (series 5, image 38). Vascular/Lymphatic: No significant vascular findings are present. No enlarged abdominal lymph nodes. Other: No abdominal wall hernia or abnormality. Moderate volume ascites throughout the abdomen and pelvis. Musculoskeletal: No acute or significant osseous findings. IMPRESSION: 1. Pancreas divisum. No pancreatic ductal dilatation. No specific evidence of acute pancreatitis. 2. Thickening of multiple loops of decompressed small bowel in the central abdomen, suggestive of nonspecific enteritis. 3. Moderate volume ascites throughout the abdomen and pelvis. Small bilateral pleural effusions. 4. Hepatomegaly and mild hepatic steatosis. Electronically Signed   By: Jearld Lesch M.D.   On: 08/30/2022 19:54   DG Abd Portable 1V  Result Date: 08/30/2022 CLINICAL DATA:  Feeding tube placement.  EXAM: PORTABLE ABDOMEN - 1 VIEW COMPARISON:  08/30/2022 at 2:15 p.m. FINDINGS: Enteric feeding tube has been further inserted. Tube curls in the right mid abdomen, expected location of the distal stomach, metallic tip in the left upper  quadrant consistent with the gastric fundus. IMPRESSION: Enteric feeding tube position as detailed above. Electronically Signed   By: Amie Portland M.D.   On: 08/30/2022 16:24   DG Abd Portable 1V  Result Date: 08/30/2022 CLINICAL DATA:  Feeding tube placement. EXAM: PORTABLE ABDOMEN - 1 VIEW COMPARISON:  None Available. FINDINGS: Tip of the weighted enteric tube in the right upper quadrant in the region of the distal stomach or proximal duodenum. No bowel dilatation to suggest obstruction IMPRESSION: Tip of the weighted enteric tube in the right upper quadrant in the region of the distal stomach or proximal duodenum. Electronically Signed   By: Narda Rutherford M.D.   On: 08/30/2022 14:42   IR Paracentesis  Result Date: 08/30/2022 INDICATION: Patient with a history of alcohol abuse, chronic pancreatitis admitted for acute pancreatitis and found to have new onset ascites. Request received to perform diagnostic and therapeutic paracentesis. EXAM: ULTRASOUND GUIDED DIAGNOSTIC AND THERAPEUTIC PARACENTESIS MEDICATIONS: 8 cc 1% lidocaine COMPLICATIONS: None immediate. PROCEDURE: Informed written consent was obtained from the patient after a discussion of the risks, benefits and alternatives to treatment. A timeout was performed prior to the initiation of the procedure. Initial ultrasound scanning demonstrates a moderate amount of ascites within the right lower abdominal quadrant. The right lower abdomen was prepped and draped in the usual sterile fashion. 1% lidocaine was used for local anesthesia. Following this, a 19 gauge, 7-cm, Yueh catheter was introduced. An ultrasound image was saved for documentation purposes. The paracentesis was performed. The catheter was removed and a  dressing was applied. The patient tolerated the procedure well without immediate post procedural complication. FINDINGS: A total of approximately 3 L of yellow-red fluid was removed. Samples were sent to the laboratory as requested by the clinical team. IMPRESSION: Successful ultrasound-guided paracentesis yielding 3 liters of peritoneal fluid. Procedure performed by Mina Marble, PA-C Electronically Signed   By: Roanna Banning M.D.   On: 08/30/2022 12:26        Scheduled Meds:  acidophilus  2 capsule Oral TID   enoxaparin (LOVENOX) injection  40 mg Subcutaneous Q24H   feeding supplement  237 mL Oral BID BM   feeding supplement (PROSource TF20)  60 mL Per Tube Daily   folic acid  1 mg Oral Daily   gabapentin  100 mg Oral TID   ketorolac  30 mg Intravenous Q6H   lidocaine (PF)  10 mL Intradermal Once   lipase/protease/amylase  12,000 Units Oral TID AC   multivitamin with minerals  1 tablet Oral Daily   octreotide  100 mcg Subcutaneous Q12H   pantoprazole  40 mg Oral BID AC   sucralfate  1 g Oral TID PC & HS   thiamine  100 mg Oral Daily   Continuous Infusions:  ceFEPime (MAXIPIME) IV 2 g (09/01/22 0115)   feeding supplement (VITAL 1.5 CAL) 55 mL/hr at 08/31/22 0332     LOS: 5 days   Time spent:  Azucena Fallen, DO [facility]  If 7PM-7AM, please contact night-coverage [email or web address]  09/01/2022, 7:11 AM

## 2022-09-01 NOTE — Progress Notes (Signed)
Echocardiogram 2D Echocardiogram has been performed.  Warren Lacy Genine Beckett RDCS 09/01/2022, 11:28 AM

## 2022-09-01 NOTE — Progress Notes (Signed)
Daily Progress Note  DOA: 08/26/2022 Hospital Day: 7 Chief Complaint: infected pancreatic ascites    Assessment and Plan:    Brief Narrative:   [name] is a 57 y.o. year old male with chronic pancreatitis, etoh abuse, admitted with persistent / recurrent infected peritonitis. Previously, SAAG was not compatible with portal hypertension which now makes since as this is pancreatic ascites with large amount of amylase in ascitic fluid.  However he could still have cirrhosis as imaging this admission coincidentally does show a"nodular" contour of fatty liver. More recent imaging ( MRI) shows only hepatomegaly and steatosis  Chronic pancreatitis, now with infected pancreatic ascites likely from pancreatic duct disruption.   -Ascitic fluid culture from 4 days ago is negative.  -Repeat 3 L paracentesis 7/5 shows no improvement in cell count ( PMNs 3200 with 77% Neutrophils). Additionally there is  fluid contains > 10,000 u/L amylase. Acid fast culture pending. Culture negative at day 2.  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  -Echo results reviewed. Will increase Octreotide  to 200 mg [place] BID -Will likely need ERCP in near future. -Unfortunately the cortrak was clogged and had to be removed. He is adamantly opposed to having it replaced however he isn't able to tolerate anything but cereal without vomiting.   Post-prandial N/V, ? Etiology.  MRI two days ago shows  thickening of multiple loops of decompressed small bowel in the central abdomen, suggestive of nonspecific enteritis.   Subjective / New Events:   No improvement in generalized abdominal pain. Tolerating milk and cereal, vomits everything else.    Objective:   Recent Labs    08/30/22 0401 08/31/22 0421 09/01/22 0543  WBC 11.1* 10.1 8.9  HGB 8.1* 8.9* 8.7*  HCT 24.9* 27.5* 26.7*  PLT 668* 705* 847*   BMET Recent Labs    08/30/22 0401 08/31/22 0421 09/01/22 0543  NA  131* 134* 133*  K 3.5 4.5 4.5  CL 102 104 105  CO2 20* 20* 21*  GLUCOSE 97 107* 118*  BUN 16 16 18   CREATININE 0.99 0.90 1.04  CALCIUM 8.3* 8.4* 8.3*   LFT Recent Labs    09/01/22 0543  PROT 6.2*  ALBUMIN 2.1*  AST 28  ALT 15  ALKPHOS 54  BILITOT 0.8   PT/INR No results for input(s): "LABPROT", "INR" in the last 72 hours.   Imaging:  ECHOCARDIOGRAM COMPLETE    ECHOCARDIOGRAM REPORT       Patient Name:   Samuel Blevins Date of Exam: 09/01/2022 Medical Rec #:  [id number]          Height:       69.0 in Accession #:    [id number]         Weight:       163.4 lb Date of Birth:  1965/06/19          BSA:          1.896 m Patient Age:    57 years           BP:           113/72 mmHg Patient Gender: M                  HR:           103 bpm. Exam Location:  Inpatient  Procedure: 2D Echo, Color Doppler and Cardiac Doppler  Indications:    I50.9* Heart failure (unspecified)  History:        Patient has no prior history of Echocardiogram examinations.                 Risk Factors:Hypertension and ETOH.   Sonographer:    Irving Burton Senior RDCS Referring Phys: [phone] Kimela Malstrom M Wataru Mccowen  IMPRESSIONS   1. Left ventricular ejection fraction, by estimation, is 60 to 65%. The left ventricle has normal function. The left ventricle has no regional wall motion abnormalities. There is mild concentric left ventricular hypertrophy. Left ventricular diastolic  parameters are consistent with Grade I diastolic dysfunction (impaired relaxation).  2. Right ventricular systolic function is normal. The right ventricular size is normal. There is normal pulmonary artery systolic pressure. The estimated right ventricular systolic pressure is 32.6 mmHg.  3. The mitral valve is grossly normal. No evidence of mitral valve regurgitation.  4. The aortic valve is tricuspid. Aortic valve regurgitation is not visualized.  5. The inferior vena cava is normal in size with greater than 50% respiratory variability,  suggesting right atrial pressure of 3 mmHg.  Comparison(s): No prior Echocardiogram.  FINDINGS  Left Ventricle: Left ventricular ejection fraction, by estimation, is 60 to 65%. The left ventricle has normal function. The left ventricle has no regional wall motion abnormalities. The left ventricular internal cavity size was normal in size. There is  mild concentric left ventricular hypertrophy. Left ventricular diastolic parameters are consistent with Grade I diastolic dysfunction (impaired relaxation).  Right Ventricle: The right ventricular size is normal. No increase in right ventricular wall thickness. Right ventricular systolic function is normal. There is normal pulmonary artery systolic pressure. The tricuspid regurgitant velocity is 2.72 m/s, and  with an assumed right atrial pressure of 3 mmHg, the estimated right ventricular systolic pressure is 32.6 mmHg.  Left Atrium: Left atrial size was normal in size.  Right Atrium: Right atrial size was normal in size.  Pericardium: Trivial pericardial effusion is present. The pericardial effusion is posterior to the left ventricle.  Mitral Valve: The mitral valve is grossly normal. No evidence of mitral valve regurgitation.  Tricuspid Valve: The tricuspid valve is grossly normal. Tricuspid valve regurgitation is mild.  Aortic Valve: The aortic valve is tricuspid. Aortic valve regurgitation is not visualized.  Pulmonic Valve: The pulmonic valve was grossly normal. Pulmonic valve regurgitation is trivial.  Aorta: The aortic root and ascending aorta are structurally normal, with no evidence of dilitation.  Venous: The inferior vena cava is normal in size with greater than 50% respiratory variability, suggesting right atrial pressure of 3 mmHg.  IAS/Shunts: No atrial level shunt detected by color flow Doppler.    LEFT VENTRICLE PLAX 2D LVIDd:         3.50 cm   Diastology LVIDs:         2.40 cm   LV e' medial:    7.40 cm/s LV PW:          1.30 cm   LV E/e' medial:  10.1 LV IVS:        1.20 cm   LV e' lateral:   9.03 cm/s LVOT diam:     1.80 cm   LV E/e' lateral: 8.3 LV SV:         45 LV SV Index:   23 LVOT Area:     2.54 cm    RIGHT VENTRICLE RV S prime:     14.90 cm/s TAPSE (M-mode): 1.6 cm  LEFT ATRIUM  Index        RIGHT ATRIUM           Index LA diam:        3.80 cm 2.00 cm/m   RA Area:     11.60 cm LA Vol (A2C):   47.2 ml 24.90 ml/m  RA Volume:   22.90 ml  12.08 ml/m LA Vol (A4C):   38.0 ml 20.04 ml/m LA Biplane Vol: 43.0 ml 22.68 ml/m  AORTIC VALVE LVOT Vmax:   110.00 cm/s LVOT Vmean:  80.500 cm/s LVOT VTI:    0.175 m   AORTA Ao Root diam: 2.90 cm Ao Asc diam:  3.30 cm  MITRAL VALVE                TRICUSPID VALVE MV Area (PHT): 3.33 cm     TR Peak grad:   29.6 mmHg MV Decel Time: 228 msec     TR Vmax:        272.00 cm/s MV E velocity: 74.90 cm/s MV A velocity: 102.00 cm/s  SHUNTS MV E/A ratio:  0.73         Systemic VTI:  0.18 m                             Systemic Diam: 1.80 cm  Nona Dell MD Electronically signed by Nona Dell MD Signature Date/Time: 09/01/2022/12:31:38 PM      Final       Scheduled inpatient medications:   acidophilus  2 capsule Oral TID   enoxaparin (LOVENOX) injection  40 mg Subcutaneous Q24H   feeding supplement  237 mL Oral BID BM   feeding supplement (PROSource TF20)  60 mL Per Tube Daily   folic acid  1 mg Oral Daily   gabapentin  100 mg Oral TID   ketorolac  30 mg Intravenous Q6H   lidocaine (PF)  10 mL Intradermal Once   lipase/protease/amylase  12,000 Units Oral TID AC   multivitamin with minerals  1 tablet Oral Daily   octreotide  100 mcg Subcutaneous Q12H   pantoprazole  40 mg Oral BID AC   sucralfate  1 g Oral TID PC & HS   thiamine  100 mg Oral Daily   Continuous inpatient infusions:   ceFEPime (MAXIPIME) IV 2 g (09/01/22 1004)   feeding supplement (VITAL 1.5 CAL) 55 mL/hr at 08/31/22 0332   PRN inpatient medications:  acetaminophen, guaiFENesin, hydrALAZINE, hydrOXYzine, ipratropium-albuterol, metoprolol tartrate, ondansetron (ZOFRAN) IV, oxyCODONE, senna-docusate, traZODone  Vital signs in last 24 hours: Temp:  [97.9 F (36.6 C)-99.1 F (37.3 C)] 97.9 F (36.6 C) (07/07 0753) Pulse Rate:  [97-103] 97 (07/07 0753) Resp:  [16-18] 18 (07/07 0753) BP: (113-135)/(72-84) 113/72 (07/07 0753) SpO2:  [98 %-100 %] 100 % (07/07 0753) Weight:  [74.1 kg] 74.1 kg (07/07 0528) Last BM Date : 08/31/22  Intake/Output Summary (Last 24 hours) at 09/01/2022 1301 Last data filed at 09/01/2022 0600 Gross per 24 hour  Intake 360 ml  Output 750 ml  Net -390 ml    Intake/Output from previous day: 07/06 0701 - 07/07 0700 In: 360 [P.O.:360] Out: 950 [Urine:950] Intake/Output this shift: No intake/output data recorded.   Physical Exam:  General: Alert male in NAD Heart:  Regular rate and rhythm.  Pulmonary: Normal respiratory effort Abdomen: Soft, moderately distended, nontender. Normal bowel sounds. Extremities: No lower extremity edema  Neurologic: Alert and oriented Psych: Cooperative.   Principal Problem:  Sepsis (HCC) Active Problems:   Alcohol dependence syndrome (HCC)   Acute on chronic pancreatitis (HCC)   Pancreatitis   Acute pancreatitis   Other ascites   Protein-calorie malnutrition, severe   Alcoholic liver disease (HCC)   Pancreatic ascites   Anasarca     LOS: 5 days   Willette Cluster ,NP 09/01/2022, 1:01 PM

## 2022-09-01 NOTE — Plan of Care (Signed)
A/ox3-4 and on room air. Pain controlled with IV toradol. Up with rolling walker. Continues to have gross ascites. Patient able to eat half of his dinner over time this shift. Vitals stable.   Problem: Fluid Volume: Goal: Hemodynamic stability will improve Outcome: Progressing   Problem: Clinical Measurements: Goal: Diagnostic test results will improve Outcome: Progressing Goal: Signs and symptoms of infection will decrease Outcome: Progressing   Problem: Respiratory: Goal: Ability to maintain adequate ventilation will improve Outcome: Progressing   Problem: Education: Goal: Knowledge of General Education information will improve Description: Including pain rating scale, medication(s)/side effects and non-pharmacologic comfort measures Outcome: Progressing   Problem: Health Behavior/Discharge Planning: Goal: Ability to manage health-related needs will improve Outcome: Progressing   Problem: Clinical Measurements: Goal: Ability to maintain clinical measurements within normal limits will improve Outcome: Progressing Goal: Will remain free from infection Outcome: Progressing Goal: Diagnostic test results will improve Outcome: Progressing Goal: Respiratory complications will improve Outcome: Progressing Goal: Cardiovascular complication will be avoided Outcome: Progressing   Problem: Activity: Goal: Risk for activity intolerance will decrease Outcome: Progressing   Problem: Nutrition: Goal: Adequate nutrition will be maintained Outcome: Progressing   Problem: Coping: Goal: Level of anxiety will decrease Outcome: Progressing   Problem: Elimination: Goal: Will not experience complications related to bowel motility Outcome: Progressing Goal: Will not experience complications related to urinary retention Outcome: Progressing   Problem: Pain Managment: Goal: General experience of comfort will improve Outcome: Progressing   Problem: Safety: Goal: Ability to remain  free from injury will improve Outcome: Progressing   Problem: Skin Integrity: Goal: Risk for impaired skin integrity will decrease Outcome: Progressing

## 2022-09-02 ENCOUNTER — Inpatient Hospital Stay (HOSPITAL_COMMUNITY): Payer: MEDICAID

## 2022-09-02 DIAGNOSIS — A419 Sepsis, unspecified organism: Secondary | ICD-10-CM | POA: Diagnosis not present

## 2022-09-02 DIAGNOSIS — K860 Alcohol-induced chronic pancreatitis: Secondary | ICD-10-CM | POA: Diagnosis not present

## 2022-09-02 DIAGNOSIS — R6520 Severe sepsis without septic shock: Secondary | ICD-10-CM

## 2022-09-02 DIAGNOSIS — R188 Other ascites: Secondary | ICD-10-CM | POA: Diagnosis not present

## 2022-09-02 DIAGNOSIS — K8689 Other specified diseases of pancreas: Secondary | ICD-10-CM | POA: Diagnosis not present

## 2022-09-02 HISTORY — PX: IR PARACENTESIS: IMG2679

## 2022-09-02 LAB — AMYLASE, PLEURAL OR PERITONEAL FLUID: Amylase, Fluid: >10000 U/L

## 2022-09-02 LAB — COMPREHENSIVE METABOLIC PANEL
ALT: 15 U/L (ref 0–44)
AST: 24 U/L (ref 15–41)
Albumin: 1.9 g/dL — ABNORMAL LOW (ref 3.5–5.0)
Alkaline Phosphatase: 48 U/L (ref 38–126)
Anion gap: 7 (ref 5–15)
BUN: 20 mg/dL (ref 6–20)
CO2: 21 mmol/L — ABNORMAL LOW (ref 22–32)
Calcium: 8.3 mg/dL — ABNORMAL LOW (ref 8.9–10.3)
Chloride: 106 mmol/L (ref 98–111)
Creatinine, Ser: 1.06 mg/dL (ref 0.61–1.24)
GFR, Estimated: >60 mL/min (ref >60)
Glucose, Bld: 100 mg/dL — ABNORMAL HIGH (ref 70–99)
Potassium: 4.1 mmol/L (ref 3.5–5.1)
Sodium: 134 mmol/L — ABNORMAL LOW (ref 135–145)
Total Bilirubin: 0.5 mg/dL (ref 0.3–1.2)
Total Protein: 5.9 g/dL — ABNORMAL LOW (ref 6.5–8.1)

## 2022-09-02 LAB — CBC
HCT: 24.6 % — ABNORMAL LOW (ref 39.0–52.0)
Hemoglobin: 8 g/dL — ABNORMAL LOW (ref 13.0–17.0)
MCH: 29.2 pg (ref 26.0–34.0)
MCHC: 32.5 g/dL (ref 30.0–36.0)
MCV: 89.8 fL (ref 80.0–100.0)
Platelets: 789 10*3/uL — ABNORMAL HIGH (ref 150–400)
RBC: 2.74 MIL/uL — ABNORMAL LOW (ref 4.22–5.81)
RDW: 21.2 % — ABNORMAL HIGH (ref 11.5–15.5)
WBC: 7.6 10*3/uL (ref 4.0–10.5)
nRBC: 0 % (ref 0.0–0.2)

## 2022-09-02 LAB — GRAM STAIN

## 2022-09-02 LAB — BODY FLUID CELL COUNT WITH DIFFERENTIAL
Eos, Fluid: 4 %
Lymphs, Fluid: 12 %
Monocyte-Macrophage-Serous Fluid: 39 % — ABNORMAL LOW (ref 50–90)
Neutrophil Count, Fluid: 42 % — ABNORMAL HIGH (ref 0–25)
Total Nucleated Cell Count, Fluid: 570 cu mm (ref 0–1000)

## 2022-09-02 LAB — PHOSPHORUS: Phosphorus: 3.7 mg/dL (ref 2.5–4.6)

## 2022-09-02 LAB — GLUCOSE, CAPILLARY
Glucose-Capillary: 124 mg/dL — ABNORMAL HIGH (ref 70–99)
Glucose-Capillary: 117 mg/dL — ABNORMAL HIGH (ref 70–99)

## 2022-09-02 LAB — MAGNESIUM: Magnesium: 1.5 mg/dL — ABNORMAL LOW (ref 1.7–2.4)

## 2022-09-02 LAB — ACID FAST SMEAR (AFB, MYCOBACTERIA): Acid Fast Smear: NEGATIVE

## 2022-09-02 LAB — PATHOLOGIST SMEAR REVIEW

## 2022-09-02 MED ORDER — LIDOCAINE HCL 1 % IJ SOLN
INTRAMUSCULAR | Status: AC
  Filled 2022-09-02: qty 20

## 2022-09-02 NOTE — Procedures (Signed)
Cortrak  Person Inserting Tube:  Clovis Riley, Hymen Arnett L, RD Tube Type:  Cortrak - 43 inches Tube Size:  10 Tube Location:  Left nare Secured by: Bridle Technique Used to Measure Tube Placement:  Marking at nare/corner of mouth Cortrak Secured At:  95 cm   Cortrak Tube Team Note:  Consult received to place a Cortrak feeding tube.   X-ray is required, abdominal x-ray has been ordered by the Cortrak team. Please confirm tube placement before using the Cortrak tube.   If the tube becomes dislodged please keep the tube and contact the Cortrak team at [email or web address] for replacement.  If after hours and replacement cannot be delayed, place a NG tube and confirm placement with an abdominal x-ray.    Kirby Crigler RD, LDN Clinical Dietitian See Loretha Stapler for contact information.

## 2022-09-02 NOTE — Progress Notes (Addendum)
Nutrition Brief Note:   The pt remains with Cortrak in place. Pt had cortrak tube advanced post-pyloric today. RD recommends continuing TF as previously ordered. Please see full assessment on 08/30/22. RD will continue to follow up per protocol.   Vital 1.5 at 15 mL/hour and advance by 10 mL/hour every 12 hours to goal rate of 55 mL/hour (1320 mL goal daily volume)  -Provide PROSource TF20 60 mL daily per tube -Provides: 2060 kcal, 109 grams of protein, 1003 mL H2O daily  RD will continue to monitor TF tolerance. Labs reviewed.   Bethann Humble, RD, LDN, CNSC.

## 2022-09-02 NOTE — Progress Notes (Signed)
[facility] ROUNDING NOTE   Subjective: Patient reports ongoing pain, but overall improved.  Eating cereal, but not much else.  No vomiting.   Objective: Vital signs in last 24 hours: Temp:  [97.9 F (36.6 C)-98.4 F (36.9 C)] 98.2 F (36.8 C) (07/08 1619) Pulse Rate:  [94-97] 97 (07/08 1619) Resp:  [17-18] 17 (07/08 1619) BP: (119-130)/(74-83) 130/83 (07/08 1619) SpO2:  [97 %-100 %] 99 % (07/08 1619) Weight:  [74 kg] 74 kg (07/08 0438) Last BM Date : 09/01/22 General: NAD Lungs:  CTA b/l, no w/r/r Heart:  RRR, no m/r/g Abdomen:  Soft, mild diffuse tenderness to palpation without guarding, distended, +BS Ext:  No c/c/e    Intake/Output from previous day: 07/07 0701 - 07/08 0700 In: 480 [P.O.:480] Out: 400 [Urine:400] Intake/Output this shift: Total I/O In: -  Out: 450 [Urine:450]   Lab Results: Recent Labs    08/31/22 0421 09/01/22 0543 09/02/22 0540  WBC 10.1 8.9 7.6  HGB 8.9* 8.7* 8.0*  PLT 705* 847* 789*  MCV 86.8 87.8 89.8   BMET Recent Labs    08/31/22 0421 09/01/22 0543 09/02/22 0540  NA 134* 133* 134*  K 4.5 4.5 4.1  CL 104 105 106  CO2 20* 21* 21*  GLUCOSE 107* 118* 100*  BUN 16 18 20   CREATININE 0.90 1.04 1.06  CALCIUM 8.4* 8.3* 8.3*   LFT Recent Labs    08/31/22 0421 09/01/22 0543 09/02/22 0540  PROT 6.0* 6.2* 5.9*  ALBUMIN 2.4* 2.1* 1.9*  AST 30 28 24   ALT 12 15 15   ALKPHOS 50 54 48  BILITOT 1.7* 0.8 0.5   PT/INR No results for input(s): "INR" in the last 72 hours.    Imaging/Other results: DG Abd Portable 1V  Result Date: 09/02/2022 CLINICAL DATA:  57 year old male feeding tube placement. EXAM: PORTABLE ABDOMEN - 1 VIEW COMPARISON:  08/30/2022 and earlier. FINDINGS: Portable AP view at 1044 hours. Enteric feeding tube courses from the visible lower chest into the abdomen, crosses midline and conforms to the duodenum C-loop. The tip is in the midline at the level of the distal duodenum. Paucity of bowel gas in  the upper abdomen. Patchy right lung base opacity, not significantly changed from CT Abdomen and Pelvis on 08/26/2022. IMPRESSION: Enteric feeding tube tip at the level of the distal duodenum. Electronically Signed   By: Odessa Fleming M.D.   On: 09/02/2022 11:21   ECHOCARDIOGRAM COMPLETE  Result Date: 09/01/2022    ECHOCARDIOGRAM REPORT   Patient Name:   Samuel Blevins Date of Exam: 09/01/2022 Medical Rec #:  [id number]          Height:       69.0 in Accession #:    [id number]         Weight:       163.4 lb Date of Birth:  Nov 09, 1965          BSA:          1.896 m Patient Age:    57 years           BP:           113/72 mmHg Patient Gender: M                  HR:           103 bpm. Exam Location:  Inpatient Procedure: 2D Echo, Color Doppler and Cardiac Doppler Indications:    I50.9* Heart failure (unspecified)  History:  Patient has no prior history of Echocardiogram examinations.                 Risk Factors:Hypertension and ETOH.  Sonographer:    Irving Burton Senior RDCS Referring Phys: [id number] PAULA M GUENTHER IMPRESSIONS  1. Left ventricular ejection fraction, by estimation, is 60 to 65%. The left ventricle has normal function. The left ventricle has no regional wall motion abnormalities. There is mild concentric left ventricular hypertrophy. Left ventricular diastolic parameters are consistent with Grade I diastolic dysfunction (impaired relaxation).  2. Right ventricular systolic function is normal. The right ventricular size is normal. There is normal pulmonary artery systolic pressure. The estimated right ventricular systolic pressure is 32.6 mmHg.  3. The mitral valve is grossly normal. No evidence of mitral valve regurgitation.  4. The aortic valve is tricuspid. Aortic valve regurgitation is not visualized.  5. The inferior vena cava is normal in size with greater than 50% respiratory variability, suggesting right atrial pressure of 3 mmHg. Comparison(s): No prior Echocardiogram. FINDINGS  Left Ventricle: Left  ventricular ejection fraction, by estimation, is 60 to 65%. The left ventricle has normal function. The left ventricle has no regional wall motion abnormalities. The left ventricular internal cavity size was normal in size. There is  mild concentric left ventricular hypertrophy. Left ventricular diastolic parameters are consistent with Grade I diastolic dysfunction (impaired relaxation). Right Ventricle: The right ventricular size is normal. No increase in right ventricular wall thickness. Right ventricular systolic function is normal. There is normal pulmonary artery systolic pressure. The tricuspid regurgitant velocity is 2.72 m/s, and  with an assumed right atrial pressure of 3 mmHg, the estimated right ventricular systolic pressure is 32.6 mmHg. Left Atrium: Left atrial size was normal in size. Right Atrium: Right atrial size was normal in size. Pericardium: Trivial pericardial effusion is present. The pericardial effusion is posterior to the left ventricle. Mitral Valve: The mitral valve is grossly normal. No evidence of mitral valve regurgitation. Tricuspid Valve: The tricuspid valve is grossly normal. Tricuspid valve regurgitation is mild. Aortic Valve: The aortic valve is tricuspid. Aortic valve regurgitation is not visualized. Pulmonic Valve: The pulmonic valve was grossly normal. Pulmonic valve regurgitation is trivial. Aorta: The aortic root and ascending aorta are structurally normal, with no evidence of dilitation. Venous: The inferior vena cava is normal in size with greater than 50% respiratory variability, suggesting right atrial pressure of 3 mmHg. IAS/Shunts: No atrial level shunt detected by color flow Doppler.  LEFT VENTRICLE PLAX 2D LVIDd:         3.50 cm   Diastology LVIDs:         2.40 cm   LV e' medial:    7.40 cm/s LV PW:         1.30 cm   LV E/e' medial:  10.1 LV IVS:        1.20 cm   LV e' lateral:   9.03 cm/s LVOT diam:     1.80 cm   LV E/e' lateral: 8.3 LV SV:         45 LV SV Index:    23 LVOT Area:     2.54 cm  RIGHT VENTRICLE RV S prime:     14.90 cm/s TAPSE (M-mode): 1.6 cm LEFT ATRIUM             Index        RIGHT ATRIUM           Index LA diam:  3.80 cm 2.00 cm/m   RA Area:     11.60 cm LA Vol (A2C):   47.2 ml 24.90 ml/m  RA Volume:   22.90 ml  12.08 ml/m LA Vol (A4C):   38.0 ml 20.04 ml/m LA Biplane Vol: 43.0 ml 22.68 ml/m  AORTIC VALVE LVOT Vmax:   110.00 cm/s LVOT Vmean:  80.500 cm/s LVOT VTI:    0.175 m  AORTA Ao Root diam: 2.90 cm Ao Asc diam:  3.30 cm MITRAL VALVE                TRICUSPID VALVE MV Area (PHT): 3.33 cm     TR Peak grad:   29.6 mmHg MV Decel Time: 228 msec     TR Vmax:        272.00 cm/s MV E velocity: 74.90 cm/s MV A velocity: 102.00 cm/s  SHUNTS MV E/A ratio:  0.73         Systemic VTI:  0.18 m                             Systemic Diam: 1.80 cm Nona Dell MD Electronically signed by Nona Dell MD Signature Date/Time: 09/01/2022/12:31:38 PM    Final       Assessment and Plan:  Samuel Blevins is a 57 y.o. year old male with chronic pancreatitis, etoh abuse, admitted with persistent / recurrent infected peritonitis. Previously, SAAG was not compatible with portal hypertension which now makes since as this is pancreatic ascites with large amount of amylase in ascitic fluid.  However he could still have cirrhosis as imaging this admission coincidentally does show a"nodular" contour of fatty liver. More recent imaging ( MRI) shows only hepatomegaly and steatosis   Chronic pancreatitis, now with infected pancreatic ascites likely from pancreatic duct disruption.   -Ascitic fluid culture from 4 days ago is negative.  -Repeat 3 L paracentesis 7/5 shows no improvement in cell count ( PMNs 3200 with 77% Neutrophils).  Ascitic fluid contains > 10,000 u/L amylase. Acid fast culture pending. Culture negative at day 2.  -MRI / MRCP without PD dilation but still believe this is PD disruption based on fluid amylase.  -Echo results reviewed. Will  increase Octreotide  to 200 mg [place] BID tomorrow -Will likely need ERCP in near future. - After further discussion today, the patient was agreeable to a repeat nasogastric postpyloric feeding tube.   - If no improvement with postpyloric feeds and octreotide, will likely need pancreatic stent placement; this would likely need to be done at an academic center.     Jenel Lucks, MD  09/02/2022, 5:01 PM [facility]

## 2022-09-02 NOTE — Plan of Care (Signed)
A/Ox4, but forgetful at times. PRN oxy given x2 overnight. Patient requested toradol several times but he has maxed out for right now, patient educated about this. Up with rolling walker. Continues to eat small meals. Vitals stable. No overnight issues.    Problem: Fluid Volume: Goal: Hemodynamic stability will improve Outcome: Progressing   Problem: Clinical Measurements: Goal: Diagnostic test results will improve Outcome: Progressing Goal: Signs and symptoms of infection will decrease Outcome: Progressing   Problem: Respiratory: Goal: Ability to maintain adequate ventilation will improve Outcome: Progressing   Problem: Education: Goal: Knowledge of General Education information will improve Description: Including pain rating scale, medication(s)/side effects and non-pharmacologic comfort measures Outcome: Progressing   Problem: Health Behavior/Discharge Planning: Goal: Ability to manage health-related needs will improve Outcome: Progressing   Problem: Clinical Measurements: Goal: Ability to maintain clinical measurements within normal limits will improve Outcome: Progressing Goal: Will remain free from infection Outcome: Progressing Goal: Diagnostic test results will improve Outcome: Progressing Goal: Respiratory complications will improve Outcome: Progressing Goal: Cardiovascular complication will be avoided Outcome: Progressing   Problem: Activity: Goal: Risk for activity intolerance will decrease Outcome: Progressing   Problem: Nutrition: Goal: Adequate nutrition will be maintained Outcome: Progressing   Problem: Coping: Goal: Level of anxiety will decrease Outcome: Progressing   Problem: Elimination: Goal: Will not experience complications related to bowel motility Outcome: Progressing Goal: Will not experience complications related to urinary retention Outcome: Progressing   Problem: Pain Managment: Goal: General experience of comfort will  improve Outcome: Progressing   Problem: Safety: Goal: Ability to remain free from injury will improve Outcome: Progressing   Problem: Skin Integrity: Goal: Risk for impaired skin integrity will decrease Outcome: Progressing

## 2022-09-02 NOTE — Procedures (Signed)
PROCEDURE SUMMARY:  Successful US guided paracentesis from right abdomen.  Yielded 1.8 L of blood-tinged fluid.  No immediate complications.  Pt tolerated well.   Specimen sent for labs.  EBL < 2 mL  Mickie Kay, NP 09/02/2022 3:18 PM

## 2022-09-02 NOTE — Progress Notes (Signed)
PROGRESS NOTE    Samuel Blevins  UJW:[id number] DOB: [birth year] DOA: 08/26/2022 PCP: [name], MD   Brief Narrative:  57 year old with history of recurrent pancreatitis, chronic pancreatic insufficiency, HTN recurrent alcohol use comes to the hospital with complaints of abdominal pain, nausea, vomiting and diarrhea.  Patient was also found to have acute recurrent alcoholic pancreatitis.  Initially patient had left AMA about 4-5 days prior to this admission.  Assessment & Plan:   Principal Problem:   Sepsis (HCC) Active Problems:   Alcohol dependence syndrome (HCC)   Acute on chronic pancreatitis (HCC)   Pancreatitis   Acute pancreatitis   Other ascites   Protein-calorie malnutrition, severe   Alcoholic liver disease (HCC)   Pancreatic ascites   Anasarca  Recurrent alcohol induced pancreatitis. Nausea vomiting, resolved -Secondary to chronic alcohol use/abuse -Continue conservative management IV fluids, p.o. intake as tolerated, pain control avoiding IV narcotics, antiemetics, PPI  Decompensated liver cirrhosis with ascites Sepsis secondary to suspected SBP versus infected pancreatic ascites Hypoalbuminemia Hypovolemic hyponatremia -Sepsis evidenced by tachycardia and leukocytosis.  Source presumed pancreatic duct disruption(elevated amylase on paracentesis) -Paracentesis 7/1 - 3.1 L removed; para 7/5 - 3L withdrawn -Blood culture, paracentesis fluid culture remain negative -Urine culture shows multiple species, likely contaminant -Continue empiric cefepime -Continue octreotide -Echo: EF 60-65% with normal LVF and grade 1 diastolic dysfunction -Possible need for ERCP/pancreatic duct stenting per GI -Core track placed 7/5 Removed 7/6 as it was nonfunctional/could not be cleared -7/8 patient agreeable for cortrak placement - placed in distal duodenum per GI recs - continue tube feeds(monitor for refeeding syndrome)  Severe protein caloric malnutrition High risk  for refeeding syndrome -Complicated by chronic alcohol use -Core track placed 7/5 -dysfunctional 7/6, given patient can take p.o. decision was made to remove core track until Monday when it can be placed post pylorus as above. -Continue to follow mag,phos, metabolic panel-replete as appropriate  Alcohol abuse - Alcohol withdrawal protocol completed, currently outside the window for any further withdrawal symptoms.  No further need for benzos IV versus p.o.    Essential hypertension -Currently well-controlled off medications, as needed hydralazine for hypertensive events   GERD/duodenitis -Continue PPI   DVT prophylaxis: enoxaparin (LOVENOX) injection 40 mg Start: 08/26/22 1700 Code Status:   Code Status: Full Code Family Communication: None present  Status is: Inpatient  Dispo: The patient is from: Home              Anticipated d/c is to: To be determined              Anticipated d/c date is: 48 to 72 hours              Patient currently not medically stable for discharge given ongoing need for IV fluids, antibiotics, antiemetics, benzodiazepines as well as analgesics  Consultants:   GI  Antimicrobials:  Cefepime  Subjective: No acute issues/events overnight, tolerated cortrak placement without complication.  Objective: Vitals:   09/01/22 0753 09/01/22 1642 09/01/22 1926 09/02/22 0438  BP: 113/72 113/76 121/83 119/74  Pulse: 97 (!) 101 94 97  Resp: 18 18 18 18   Temp: 97.9 F (36.6 C) 98.6 F (37 C) 97.9 F (36.6 C) 98 F (36.7 C)  TempSrc:  Oral Oral Oral  SpO2: 100% 98% 99% 100%  Weight:    74 kg  Height:       No intake or output data in the 24 hours ending 09/02/22 0653  Filed Weights   08/31/22 0500 09/01/22  1610 09/02/22 0438  Weight: 74.1 kg 74.1 kg 74 kg    Examination:  General:  Pleasantly resting in bed, No acute distress. HEENT:  Normocephalic atraumatic.  Sclerae nonicteric, noninjected.  Extraocular movements intact bilaterally.  Core  track secured in place, clogged. Neck:  Without mass or deformity.  Trachea is midline. Lungs:  Clear to auscultate bilaterally without rhonchi, wheeze, or rales. Heart:  Regular rate and rhythm.  Without murmurs, rubs, or gallops. Abdomen:  Soft, moderately distended Extremities: Without cyanosis, clubbing, or obvious deformity. Skin:  Warm and dry, no erythema.   Data Reviewed: I have personally reviewed following labs and imaging studies  CBC: Recent Labs  Lab 08/26/22 0847 08/27/22 0431 08/28/22 0442 08/29/22 0220 08/30/22 0401 08/31/22 0421 09/01/22 0543 09/02/22 0540  WBC 17.1*   < > 13.2* 11.6* 11.1* 10.1 8.9 7.6  NEUTROABS 13.6*  --  10.9*  --   --   --   --   --   HGB 11.6*   < > 9.4* 8.2* 8.1* 8.9* 8.7* 8.0*  HCT 36.9*   < > 28.0* 25.3* 24.9* 27.5* 26.7* 24.6*  MCV 89.6   < > 89.7 88.5 87.4 86.8 87.8 89.8  PLT 558*   < > 568* 578* 668* 705* 847* 789*   < > = values in this interval not displayed.    Basic Metabolic Panel: Recent Labs  Lab 08/28/22 0442 08/29/22 0220 08/30/22 0401 08/31/22 0421 09/01/22 0543  NA 128* 130* 131* 134* 133*  K 4.2 3.6 3.5 4.5 4.5  CL 99 101 102 104 105  CO2 21* 20* 20* 20* 21*  GLUCOSE 103* 93 97 107* 118*  BUN 16 16 16 16 18   CREATININE 0.92 0.95 0.99 0.90 1.04  CALCIUM 7.4* 7.9* 8.3* 8.4* 8.3*  MG 1.7 1.7 1.7 1.7 1.7  PHOS  --  3.3  --  3.7 3.3    GFR: Estimated Creatinine Clearance: 77.4 mL/min (by C-G formula based on SCr of 1.04 mg/dL). Liver Function Tests: Recent Labs  Lab 08/28/22 0442 08/29/22 0220 08/30/22 0401 08/31/22 0421 09/01/22 0543  AST 22 19 21 30 28   ALT 11 9 9 12 15   ALKPHOS 48 45 48 50 54  BILITOT 1.1 1.1 1.2 1.7* 0.8  PROT 5.3* 5.8* 6.3* 6.0* 6.2*  ALBUMIN <1.5* 2.3* 2.8* 2.4* 2.1*    Recent Labs  Lab 08/26/22 1050 08/27/22 0431 08/28/22 0442  LIPASE 445* 197* 384*    No results for input(s): "AMMONIA" in the last 168 hours. Coagulation Profile: Recent Labs  Lab 08/26/22 1002   INR 1.2    Sepsis Labs: Recent Labs  Lab 08/26/22 0847 08/26/22 1022 08/28/22 0442  LATICACIDVEN 2.1* 1.4 0.6     Recent Results (from the past 240 hour(s))  Blood culture (routine x 2)     Status: None   Collection Time: 08/26/22  8:47 AM   Specimen: BLOOD  Result Value Ref Range Status   Specimen Description BLOOD SITE NOT SPECIFIED  Final   Special Requests   Final    BOTTLES DRAWN AEROBIC AND ANAEROBIC Blood Culture results may not be optimal due to an inadequate volume of blood received in culture bottles   Culture   Final    NO GROWTH 5 DAYS Performed at [facility], [address], [place], [place] [address]    Report Status 08/31/2022 FINAL  Final  Blood culture (routine x 2)     Status: None   Collection Time:  08/26/22  8:52 AM   Specimen: BLOOD  Result Value Ref Range Status   Specimen Description BLOOD SITE NOT SPECIFIED  Final   Special Requests   Final    BOTTLES DRAWN AEROBIC AND ANAEROBIC Blood Culture adequate volume   Culture   Final    NO GROWTH 5 DAYS Performed at [facility], [address], [place], [place] [address]    Report Status 08/31/2022 FINAL  Final  Resp panel by RT-PCR (RSV, Flu A&B, Covid) Anterior Nasal Swab     Status: None   Collection Time: 08/26/22  9:52 AM   Specimen: Anterior Nasal Swab  Result Value Ref Range Status   SARS Coronavirus 2 by RT PCR NEGATIVE NEGATIVE Final   Influenza A by PCR NEGATIVE NEGATIVE Final   Influenza B by PCR NEGATIVE NEGATIVE Final    Comment: (NOTE) The Xpert Xpress SARS-CoV-2/FLU/RSV plus assay is intended as an aid in the diagnosis of influenza from Nasopharyngeal swab specimens and should not be used as a sole basis for treatment. Nasal washings and aspirates are unacceptable for Xpert Xpress SARS-CoV-2/FLU/RSV testing.  Fact Sheet for Patients: BloggerCourse.com  Fact Sheet for Healthcare Providers: SeriousBroker.it  This  test is not yet approved or cleared by the [place] FDA and has been authorized for detection and/or diagnosis of SARS-CoV-2 by FDA under an Emergency Use Authorization (EUA). This EUA will remain in effect (meaning this test can be used) for the duration of the COVID-19 declaration under Section 564(b)(1) of the Act, 21 U.S.C. section 360bbb-3(b)(1), unless the authorization is terminated or revoked.     Resp Syncytial Virus by PCR NEGATIVE NEGATIVE Final    Comment: (NOTE) Fact Sheet for Patients: BloggerCourse.com  Fact Sheet for Healthcare Providers: SeriousBroker.it  This test is not yet approved or cleared by the [place] FDA and has been authorized for detection and/or diagnosis of SARS-CoV-2 by FDA under an Emergency Use Authorization (EUA). This EUA will remain in effect (meaning this test can be used) for the duration of the COVID-19 declaration under Section 564(b)(1) of the Act, 21 U.S.C. section 360bbb-3(b)(1), unless the authorization is terminated or revoked.  Performed at [facility], [address] [place], [place] [address]   Gram stain     Status: None   Collection Time: 08/26/22  2:10 PM   Specimen: Abdomen; Peritoneal Fluid  Result Value Ref Range Status   Specimen Description PERITONEAL  Final   Special Requests NONE  Final   Gram Stain   Final    ABUNDANT WBC PRESENT,BOTH PMN AND MONONUCLEAR NO ORGANISMS SEEN Performed at [facility], [address] [place], [place] [address]    Report Status 08/26/2022 FINAL  Final  Culture, body fluid w Gram Stain-bottle     Status: None   Collection Time: 08/26/22  2:10 PM   Specimen: Peritoneal Washings  Result Value Ref Range Status   Specimen Description PERITONEAL  Final   Special Requests NONE  Final   Culture   Final    NO GROWTH 5 DAYS Performed at [facility], [address], [place], [place] [address]    Report Status  08/31/2022 FINAL  Final  Urine Culture (for pregnant, neutropenic or urologic patients or patients with an indwelling urinary catheter)     Status: Abnormal   Collection Time: 08/27/22  5:15 AM   Specimen: Urine, Clean Catch  Result Value Ref Range Status   Specimen Description URINE, CLEAN  CATCH  Final   Special Requests   Final    Normal Performed at [facility], [address], [place], [place] [address]    Culture MULTIPLE SPECIES PRESENT, SUGGEST RECOLLECTION (A)  Final   Report Status 08/29/2022 FINAL  Final  Culture, body fluid w Gram Stain-bottle     Status: None (Preliminary result)   Collection Time: 08/30/22  9:41 AM   Specimen: Abdomen  Result Value Ref Range Status   Specimen Description ABDOMEN  Final   Special Requests PERITONEAL  Final   Culture   Final    NO GROWTH 3 DAYS Performed at [facility], [address], [place], [place] [address]    Report Status PENDING  Incomplete  Gram stain     Status: None   Collection Time: 08/30/22  9:41 AM   Specimen: Abdomen  Result Value Ref Range Status   Specimen Description ABDOMEN  Final   Special Requests PERITONEAL  Final   Gram Stain   Final    WBC PRESENT, PREDOMINANTLY PMN NO ORGANISMS SEEN CYTOSPIN SMEAR Performed at [facility], [address] [place], [place] [address]    Report Status 08/30/2022 FINAL  Final         Radiology Studies: ECHOCARDIOGRAM COMPLETE  Result Date: 09/01/2022    ECHOCARDIOGRAM REPORT   Patient Name:   MEGAN YEE Date of Exam: 09/01/2022 Medical Rec #:  [id number]          Height:       69.0 in Accession #:    [id number]         Weight:       163.4 lb Date of Birth:  Apr 07, 1965          BSA:          1.896 m Patient Age:    57 years           BP:           113/72 mmHg Patient Gender: M                  HR:           103 bpm. Exam Location:  Inpatient Procedure: 2D Echo, Color Doppler and Cardiac Doppler Indications:    I50.9* Heart failure (unspecified)   History:        Patient has no prior history of Echocardiogram examinations.                 Risk Factors:Hypertension and ETOH.  Sonographer:    Irving Burton Senior RDCS Referring Phys: [id number] PAULA M GUENTHER IMPRESSIONS  1. Left ventricular ejection fraction, by estimation, is 60 to 65%. The left ventricle has normal function. The left ventricle has no regional wall motion abnormalities. There is mild concentric left ventricular hypertrophy. Left ventricular diastolic parameters are consistent with Grade I diastolic dysfunction (impaired relaxation).  2. Right ventricular systolic function is normal. The right ventricular size is normal. There is normal pulmonary artery systolic pressure. The estimated right ventricular systolic pressure is 32.6 mmHg.  3. The mitral valve is grossly normal. No evidence of mitral valve regurgitation.  4. The aortic valve is tricuspid. Aortic valve regurgitation is not visualized.  5. The inferior vena cava is normal in size with greater than 50% respiratory variability, suggesting right atrial pressure of 3 mmHg. Comparison(s): No prior Echocardiogram. FINDINGS  Left Ventricle: Left ventricular ejection fraction, by estimation, is 60 to 65%. The left ventricle  has normal function. The left ventricle has no regional wall motion abnormalities. The left ventricular internal cavity size was normal in size. There is  mild concentric left ventricular hypertrophy. Left ventricular diastolic parameters are consistent with Grade I diastolic dysfunction (impaired relaxation). Right Ventricle: The right ventricular size is normal. No increase in right ventricular wall thickness. Right ventricular systolic function is normal. There is normal pulmonary artery systolic pressure. The tricuspid regurgitant velocity is 2.72 m/s, and  with an assumed right atrial pressure of 3 mmHg, the estimated right ventricular systolic pressure is 32.6 mmHg. Left Atrium: Left atrial size was normal in size. Right  Atrium: Right atrial size was normal in size. Pericardium: Trivial pericardial effusion is present. The pericardial effusion is posterior to the left ventricle. Mitral Valve: The mitral valve is grossly normal. No evidence of mitral valve regurgitation. Tricuspid Valve: The tricuspid valve is grossly normal. Tricuspid valve regurgitation is mild. Aortic Valve: The aortic valve is tricuspid. Aortic valve regurgitation is not visualized. Pulmonic Valve: The pulmonic valve was grossly normal. Pulmonic valve regurgitation is trivial. Aorta: The aortic root and ascending aorta are structurally normal, with no evidence of dilitation. Venous: The inferior vena cava is normal in size with greater than 50% respiratory variability, suggesting right atrial pressure of 3 mmHg. IAS/Shunts: No atrial level shunt detected by color flow Doppler.  LEFT VENTRICLE PLAX 2D LVIDd:         3.50 cm   Diastology LVIDs:         2.40 cm   LV e' medial:    7.40 cm/s LV PW:         1.30 cm   LV E/e' medial:  10.1 LV IVS:        1.20 cm   LV e' lateral:   9.03 cm/s LVOT diam:     1.80 cm   LV E/e' lateral: 8.3 LV SV:         45 LV SV Index:   23 LVOT Area:     2.54 cm  RIGHT VENTRICLE RV S prime:     14.90 cm/s TAPSE (M-mode): 1.6 cm LEFT ATRIUM             Index        RIGHT ATRIUM           Index LA diam:        3.80 cm 2.00 cm/m   RA Area:     11.60 cm LA Vol (A2C):   47.2 ml 24.90 ml/m  RA Volume:   22.90 ml  12.08 ml/m LA Vol (A4C):   38.0 ml 20.04 ml/m LA Biplane Vol: 43.0 ml 22.68 ml/m  AORTIC VALVE LVOT Vmax:   110.00 cm/s LVOT Vmean:  80.500 cm/s LVOT VTI:    0.175 m  AORTA Ao Root diam: 2.90 cm Ao Asc diam:  3.30 cm MITRAL VALVE                TRICUSPID VALVE MV Area (PHT): 3.33 cm     TR Peak grad:   29.6 mmHg MV Decel Time: 228 msec     TR Vmax:        272.00 cm/s MV E velocity: 74.90 cm/s MV A velocity: 102.00 cm/s  SHUNTS MV E/A ratio:  0.73         Systemic VTI:  0.18 m  Systemic Diam: 1.80 cm  Nona Dell MD Electronically signed by Nona Dell MD Signature Date/Time: 09/01/2022/12:31:38 PM    Final         Scheduled Meds:  acidophilus  2 capsule Oral TID   enoxaparin (LOVENOX) injection  40 mg Subcutaneous Q24H   feeding supplement  237 mL Oral BID BM   feeding supplement (PROSource TF20)  60 mL Per Tube Daily   folic acid  1 mg Oral Daily   gabapentin  100 mg Oral TID   lidocaine (PF)  10 mL Intradermal Once   lipase/protease/amylase  12,000 Units Oral TID AC   multivitamin with minerals  1 tablet Oral Daily   octreotide  100 mcg Subcutaneous Q12H   pantoprazole  40 mg Oral BID AC   sucralfate  1 g Oral TID PC & HS   thiamine  100 mg Oral Daily   Continuous Infusions:  ceFEPime (MAXIPIME) IV 2 g (09/02/22 0223)   feeding supplement (VITAL 1.5 CAL) 55 mL/hr at 08/31/22 0332     LOS: 6 days   Time spent:  Azucena Fallen, DO [facility]  If 7PM-7AM, please contact night-coverage [email or web address]  09/02/2022, 6:53 AM

## 2022-09-03 DIAGNOSIS — K860 Alcohol-induced chronic pancreatitis: Secondary | ICD-10-CM | POA: Diagnosis not present

## 2022-09-03 DIAGNOSIS — R188 Other ascites: Secondary | ICD-10-CM | POA: Diagnosis not present

## 2022-09-03 DIAGNOSIS — K8689 Other specified diseases of pancreas: Secondary | ICD-10-CM | POA: Diagnosis not present

## 2022-09-03 DIAGNOSIS — A419 Sepsis, unspecified organism: Secondary | ICD-10-CM | POA: Diagnosis not present

## 2022-09-03 LAB — COMPREHENSIVE METABOLIC PANEL
ALT: 15 U/L (ref 0–44)
AST: 26 U/L (ref 15–41)
Albumin: 1.8 g/dL — ABNORMAL LOW (ref 3.5–5.0)
Alkaline Phosphatase: 52 U/L (ref 38–126)
Anion gap: 6 (ref 5–15)
BUN: 17 mg/dL (ref 6–20)
CO2: 22 mmol/L (ref 22–32)
Calcium: 8.1 mg/dL — ABNORMAL LOW (ref 8.9–10.3)
Chloride: 103 mmol/L (ref 98–111)
Creatinine, Ser: 1.06 mg/dL (ref 0.61–1.24)
GFR, Estimated: >60 mL/min (ref >60)
Glucose, Bld: 123 mg/dL — ABNORMAL HIGH (ref 70–99)
Potassium: 4.3 mmol/L (ref 3.5–5.1)
Sodium: 131 mmol/L — ABNORMAL LOW (ref 135–145)
Total Bilirubin: 0.5 mg/dL (ref 0.3–1.2)
Total Protein: 6.4 g/dL — ABNORMAL LOW (ref 6.5–8.1)

## 2022-09-03 LAB — PHOSPHORUS: Phosphorus: 3.9 mg/dL (ref 2.5–4.6)

## 2022-09-03 LAB — CBC
HCT: 26.2 % — ABNORMAL LOW (ref 39.0–52.0)
Hemoglobin: 8.5 g/dL — ABNORMAL LOW (ref 13.0–17.0)
MCH: 28.9 pg (ref 26.0–34.0)
MCHC: 32.4 g/dL (ref 30.0–36.0)
MCV: 89.1 fL (ref 80.0–100.0)
Platelets: 892 10*3/uL — ABNORMAL HIGH (ref 150–400)
RBC: 2.94 MIL/uL — ABNORMAL LOW (ref 4.22–5.81)
RDW: 21 % — ABNORMAL HIGH (ref 11.5–15.5)
WBC: 8.3 10*3/uL (ref 4.0–10.5)
nRBC: 0 % (ref 0.0–0.2)

## 2022-09-03 LAB — GLUCOSE, CAPILLARY
Glucose-Capillary: 115 mg/dL — ABNORMAL HIGH (ref 70–99)
Glucose-Capillary: 118 mg/dL — ABNORMAL HIGH (ref 70–99)
Glucose-Capillary: 122 mg/dL — ABNORMAL HIGH (ref 70–99)
Glucose-Capillary: 125 mg/dL — ABNORMAL HIGH (ref 70–99)
Glucose-Capillary: 128 mg/dL — ABNORMAL HIGH (ref 70–99)
Glucose-Capillary: 142 mg/dL — ABNORMAL HIGH (ref 70–99)
Glucose-Capillary: 144 mg/dL — ABNORMAL HIGH (ref 70–99)

## 2022-09-03 LAB — LIPASE, FLUID: Lipase-Fluid: >60600 U/L

## 2022-09-03 LAB — MAGNESIUM: Magnesium: 1.4 mg/dL — ABNORMAL LOW (ref 1.7–2.4)

## 2022-09-03 LAB — PATHOLOGIST SMEAR REVIEW

## 2022-09-03 LAB — CULTURE, BODY FLUID W GRAM STAIN -BOTTLE

## 2022-09-03 MED ORDER — MAGNESIUM SULFATE 2 GM/50ML IV SOLN
2.0000 g | Freq: Once | INTRAVENOUS | Status: AC
  Administered 2022-09-03: 2 g via INTRAVENOUS
  Filled 2022-09-03: qty 50

## 2022-09-03 MED ORDER — OCTREOTIDE ACETATE 50 MCG/ML IJ SOLN
200.0000 ug | Freq: Two times a day (BID) | INTRAMUSCULAR | Status: AC
  Administered 2022-09-03 – 2022-09-10 (×15): 200 ug via SUBCUTANEOUS
  Filled 2022-09-03 (×16): qty 4

## 2022-09-03 NOTE — Progress Notes (Signed)
PROGRESS NOTE    Samuel Blevins  ZOX:[id number] DOB: [birth year] DOA: 08/26/2022 PCP: [name], MD   Brief Narrative:  57 year old with history of recurrent pancreatitis, chronic pancreatic insufficiency, HTN recurrent alcohol use comes to the hospital with complaints of abdominal pain, nausea, vomiting and diarrhea.  Patient was also found to have acute recurrent alcoholic pancreatitis.  Initially patient had left AMA about 4-5 days prior to this admission.  Patient readmitted for acute recurrent alcoholic pancreatitis with worsening ascites, initial labs of ascites fluid indicate elevated amylase concerning for pancreatic duct disruption.  GI continues to follow, hospitalist called for admission.  Assessment & Plan:   Principal Problem:   Sepsis (HCC) Active Problems:   Alcohol dependence syndrome (HCC)   Acute on chronic pancreatitis (HCC)   Pancreatitis   Acute pancreatitis   Other ascites   Protein-calorie malnutrition, severe   Alcoholic liver disease (HCC)   Pancreatic ascites   Anasarca   Decompensated liver cirrhosis with ascites Sepsis secondary to suspected SBP versus infected pancreatic ascites Hypoalbuminemia Hypovolemic hyponatremia -Sepsis evidenced by tachycardia and leukocytosis.  Source presumed pancreatic duct disruption(elevated amylase on paracentesis) -Paracentesis 7/1 - 3L; 7/5 - 3L; 7/8 2L  -Blood culture, paracentesis fluid culture remain negative -Urine culture shows multiple species, likely contaminant -Continue empiric cefepime -Continue octreotide, increased dose per GI -Echo: EF 60-65% with normal LVF and grade 1 diastolic dysfunction -Possible need for ERCP/pancreatic duct stenting per GI given questionable pancreatic duct disruption (amylase in ascites) -Core track placed 7/5 Removed 7/6 as it was nonfunctional/could not be cleared -7/8 patient agreeable for cortrak placement - placed in distal duodenum per GI recs - continue tube  feeds(monitor for refeeding syndrome)  Recurrent alcohol induced pancreatitis. Nausea vomiting, resolved -Secondary to chronic alcohol use/abuse -Continue conservative management IV fluids, p.o. intake as tolerated, pain control avoiding IV narcotics, antiemetics, PPI  Severe protein caloric malnutrition High risk for refeeding syndrome -Complicated by chronic alcohol use -Core track placed 7/5 -dysfunctional 7/6 -replaced 09/02/2022, noted to be distal to the proximal duodenum. -Continue to follow electrolytes, mag/Phos.  Repleted as appropriate  Alcohol abuse - Alcohol withdrawal protocol completed, currently outside the window for any further withdrawal symptoms.  No further need for benzos IV versus p.o.    Essential hypertension -Currently well-controlled off medications, as needed hydralazine for hypertensive events   GERD/duodenitis -Continue PPI   DVT prophylaxis: enoxaparin (LOVENOX) injection 40 mg Start: 08/26/22 1700 Code Status:   Code Status: Full Code Family Communication: None present  Status is: Inpatient  Dispo: The patient is from: Home              Anticipated d/c is to: To be determined              Anticipated d/c date is: 48 to 72 hours              Patient currently not medically stable for discharge given ongoing need for IV fluids, antibiotics, antiemetics, benzodiazepines as well as analgesics, may require further procedures as well   Consultants:  [facility]  Antimicrobials:  Cefepime  Subjective: No acute issues/events overnight, tolerating  tube feeds via Cortrak -abdominal pain improving but not yet resolved  Objective: Vitals:   09/02/22 2014 09/02/22 2015 09/03/22 0413 09/03/22 0544  BP: (!) 145/89  116/81   Pulse: (!) 101  98   Resp: 18  16   Temp:  97.9 F (36.6 C) 98.2 F (36.8 C)   TempSrc:  Oral  Oral   SpO2: 96%  96%   Weight:    73 kg  Height:        Intake/Output Summary (Last 24 hours) at 09/03/2022 0725 Last data filed  at 09/03/2022 0710 Gross per 24 hour  Intake 297.83 ml  Output 1030 ml  Net -732.17 ml    Filed Weights   09/01/22 0528 09/02/22 0438 09/03/22 0544  Weight: 74.1 kg 74 kg 73 kg    Examination:  General:  Pleasantly resting in bed, No acute distress. HEENT:  Normocephalic atraumatic.  Sclerae nonicteric, noninjected.  Extraocular movements intact bilaterally.  Cortrak in place without irritation or drainage Neck:  Without mass or deformity.  Trachea is midline. Lungs:  Clear to auscultate bilaterally without rhonchi, wheeze, or rales. Heart:  Regular rate and rhythm.  Without murmurs, rubs, or gallops. Abdomen:  Soft, moderately distended, diffusely tender Extremities: Without cyanosis, clubbing, or obvious deformity. Skin:  Warm and dry, no erythema.   Data Reviewed: I have personally reviewed following labs and imaging studies  CBC: Recent Labs  Lab 08/28/22 0442 08/29/22 0220 08/30/22 0401 08/31/22 0421 09/01/22 0543 09/02/22 0540 09/03/22 0341  WBC 13.2*   < > 11.1* 10.1 8.9 7.6 8.3  NEUTROABS 10.9*  --   --   --   --   --   --   HGB 9.4*   < > 8.1* 8.9* 8.7* 8.0* 8.5*  HCT 28.0*   < > 24.9* 27.5* 26.7* 24.6* 26.2*  MCV 89.7   < > 87.4 86.8 87.8 89.8 89.1  PLT 568*   < > 668* 705* 847* 789* 892*   < > = values in this interval not displayed.    Basic Metabolic Panel: Recent Labs  Lab 08/29/22 0220 08/30/22 0401 08/31/22 0421 09/01/22 0543 09/02/22 0540 09/03/22 0341  NA 130* 131* 134* 133* 134* 131*  K 3.6 3.5 4.5 4.5 4.1 4.3  CL 101 102 104 105 106 103  CO2 20* 20* 20* 21* 21* 22  GLUCOSE 93 97 107* 118* 100* 123*  BUN 16 16 16 18 20 17   CREATININE 0.95 0.99 0.90 1.04 1.06 1.06  CALCIUM 7.9* 8.3* 8.4* 8.3* 8.3* 8.1*  MG 1.7 1.7 1.7 1.7 1.5* 1.4*  PHOS 3.3  --  3.7 3.3 3.7 3.9    GFR: Estimated Creatinine Clearance: 76 mL/min (by C-G formula based on SCr of 1.06 mg/dL). Liver Function Tests: Recent Labs  Lab 08/30/22 0401 08/31/22 0421  09/01/22 0543 09/02/22 0540 09/03/22 0341  AST 21 30 28 24 26   ALT 9 12 15 15 15   ALKPHOS 48 50 54 48 52  BILITOT 1.2 1.7* 0.8 0.5 0.5  PROT 6.3* 6.0* 6.2* 5.9* 6.4*  ALBUMIN 2.8* 2.4* 2.1* 1.9* 1.8*    Recent Labs  Lab 08/28/22 0442  LIPASE 384*    No results for input(s): "AMMONIA" in the last 168 hours. Coagulation Profile: No results for input(s): "INR", "PROTIME" in the last 168 hours.  Sepsis Labs: Recent Labs  Lab 08/28/22 0442  LATICACIDVEN 0.6     Recent Results (from the past 240 hour(s))  Blood culture (routine x 2)     Status: None   Collection Time: 08/26/22  8:47 AM   Specimen: BLOOD  Result Value Ref Range Status   Specimen Description BLOOD SITE NOT SPECIFIED  Final   Special Requests   Final    BOTTLES DRAWN AEROBIC AND ANAEROBIC Blood Culture results may not be optimal due to an inadequate volume  of blood received in culture bottles   Culture   Final    NO GROWTH 5 DAYS Performed at [facility], [address] [place], [place] [address]    Report Status 08/31/2022 FINAL  Final  Blood culture (routine x 2)     Status: None   Collection Time: 08/26/22  8:52 AM   Specimen: BLOOD  Result Value Ref Range Status   Specimen Description BLOOD SITE NOT SPECIFIED  Final   Special Requests   Final    BOTTLES DRAWN AEROBIC AND ANAEROBIC Blood Culture adequate volume   Culture   Final    NO GROWTH 5 DAYS Performed at [facility], [address] [place], [place] [address]    Report Status 08/31/2022 FINAL  Final  Resp panel by RT-PCR (RSV, Flu A&B, Covid) Anterior Nasal Swab     Status: None   Collection Time: 08/26/22  9:52 AM   Specimen: Anterior Nasal Swab  Result Value Ref Range Status   SARS Coronavirus 2 by RT PCR NEGATIVE NEGATIVE Final   Influenza A by PCR NEGATIVE NEGATIVE Final   Influenza B by PCR NEGATIVE NEGATIVE Final    Comment: (NOTE) The Xpert Xpress SARS-CoV-2/FLU/RSV plus assay is intended as an aid in the  diagnosis of influenza from Nasopharyngeal swab specimens and should not be used as a sole basis for treatment. Nasal washings and aspirates are unacceptable for Xpert Xpress SARS-CoV-2/FLU/RSV testing.  Fact Sheet for Patients: BloggerCourse.com  Fact Sheet for Healthcare Providers: SeriousBroker.it  This test is not yet approved or cleared by the [place] FDA and has been authorized for detection and/or diagnosis of SARS-CoV-2 by FDA under an Emergency Use Authorization (EUA). This EUA will remain in effect (meaning this test can be used) for the duration of the COVID-19 declaration under Section 564(b)(1) of the Act, 21 U.S.C. section 360bbb-3(b)(1), unless the authorization is terminated or revoked.     Resp Syncytial Virus by PCR NEGATIVE NEGATIVE Final    Comment: (NOTE) Fact Sheet for Patients: BloggerCourse.com  Fact Sheet for Healthcare Providers: SeriousBroker.it  This test is not yet approved or cleared by the [place] FDA and has been authorized for detection and/or diagnosis of SARS-CoV-2 by FDA under an Emergency Use Authorization (EUA). This EUA will remain in effect (meaning this test can be used) for the duration of the COVID-19 declaration under Section 564(b)(1) of the Act, 21 U.S.C. section 360bbb-3(b)(1), unless the authorization is terminated or revoked.  Performed at [facility], [address], [place], [place] [address]   Gram stain     Status: None   Collection Time: 08/26/22  2:10 PM   Specimen: Abdomen; Peritoneal Fluid  Result Value Ref Range Status   Specimen Description PERITONEAL  Final   Special Requests NONE  Final   Gram Stain   Final    ABUNDANT WBC PRESENT,BOTH PMN AND MONONUCLEAR NO ORGANISMS SEEN Performed at [facility], [address], [place], [place] [address]    Report Status 08/26/2022 FINAL  Final   Culture, body fluid w Gram Stain-bottle     Status: None   Collection Time: 08/26/22  2:10 PM   Specimen: Peritoneal Washings  Result Value Ref Range Status   Specimen Description PERITONEAL  Final   Special Requests NONE  Final   Culture   Final    NO GROWTH 5 DAYS Performed at [facility], [address] [place], [place] [address]  Report Status 08/31/2022 FINAL  Final  Urine Culture (for pregnant, neutropenic or urologic patients or patients with an indwelling urinary catheter)     Status: Abnormal   Collection Time: 08/27/22  5:15 AM   Specimen: Urine, Clean Catch  Result Value Ref Range Status   Specimen Description URINE, CLEAN CATCH  Final   Special Requests   Final    Normal Performed at [facility], [address] [place], [place] [address]    Culture MULTIPLE SPECIES PRESENT, SUGGEST RECOLLECTION (A)  Final   Report Status 08/29/2022 FINAL  Final  Acid Fast Smear (AFB)     Status: None   Collection Time: 08/30/22  9:41 AM   Specimen: Abdomen; Peritoneal Fluid  Result Value Ref Range Status   AFB Specimen Processing Concentration  Final   Acid Fast Smear Negative  Final    Comment: (NOTE) Performed At: [facility] [address] [place], [place] [id number] Jolene Schimke MD XB:[id number]    Source (AFB) PERITONEAL  Final    Comment: Performed at [facility], [address] [place], [place] [address]  Culture, body fluid w Gram Stain-bottle     Status: None (Preliminary result)   Collection Time: 08/30/22  9:41 AM   Specimen: Abdomen  Result Value Ref Range Status   Specimen Description ABDOMEN  Final   Special Requests PERITONEAL  Final   Culture   Final    NO GROWTH 4 DAYS CORRECTED ON 07/09 AT 0704: PREVIOUSLY REPORTED AS NO GROWTH 3 DAYS Performed at [facility], [address] [place], [place] [address]    Report Status PENDING  Incomplete  Gram stain     Status: None   Collection Time: 08/30/22  9:41 AM    Specimen: Abdomen  Result Value Ref Range Status   Specimen Description ABDOMEN  Final   Special Requests PERITONEAL  Final   Gram Stain   Final    WBC PRESENT, PREDOMINANTLY PMN NO ORGANISMS SEEN CYTOSPIN SMEAR Performed at [facility], [address], [place], [place] [address]    Report Status 08/30/2022 FINAL  Final  Gram stain     Status: None   Collection Time: 09/02/22  2:47 PM   Specimen: Abdomen; Peritoneal Fluid  Result Value Ref Range Status   Specimen Description ABDOMEN  Final   Special Requests NONE  Final   Gram Stain   Final    FEW WBC SEEN NO ORGANISMS SEEN Performed at [facility], [address] [place], [place] [address]    Report Status 09/02/2022 FINAL  Final         Radiology Studies: IR Paracentesis  Result Date: 09/02/2022 INDICATION: Patient with a history of alcohol abuse, chronic pancreatitis admitted with acute pancreatitis and found to have new onset ascites. Interventional radiology asked to perform a diagnostic and therapeutic paracentesis EXAM: ULTRASOUND GUIDED PARACENTESIS MEDICATIONS: 1% lidocaine 20 mL COMPLICATIONS: None immediate. PROCEDURE: Informed written consent was obtained from the patient after a discussion of the risks, benefits and alternatives to treatment. A timeout was performed prior to the initiation of the procedure. Initial ultrasound scanning demonstrates a large amount of ascites within the right lower abdominal quadrant. The right lower abdomen was prepped and draped in the usual sterile fashion. 1% lidocaine was used for local anesthesia. Following this, a 19 gauge, 7-cm, Yueh catheter was introduced. An ultrasound image was saved for documentation purposes. The paracentesis was performed. The catheter was  removed and a dressing was applied. The patient tolerated the procedure well without immediate post procedural complication. FINDINGS: A total of approximately 1.8 L of blood-tinged fluid was removed. Samples  were sent to the laboratory as requested by the clinical team. IMPRESSION: Successful ultrasound-guided paracentesis yielding 1.8 liters of peritoneal fluid. Procedure performed by Alwyn Ren NP and supervised by Dr. Lowella Dandy. Electronically Signed   By: Richarda Overlie M.D.   On: 09/02/2022 17:27   DG Abd Portable 1V  Result Date: 09/02/2022 CLINICAL DATA:  57 year old male feeding tube placement. EXAM: PORTABLE ABDOMEN - 1 VIEW COMPARISON:  08/30/2022 and earlier. FINDINGS: Portable AP view at 1044 hours. Enteric feeding tube courses from the visible lower chest into the abdomen, crosses midline and conforms to the duodenum C-loop. The tip is in the midline at the level of the distal duodenum. Paucity of bowel gas in the upper abdomen. Patchy right lung base opacity, not significantly changed from CT Abdomen and Pelvis on 08/26/2022. IMPRESSION: Enteric feeding tube tip at the level of the distal duodenum. Electronically Signed   By: Odessa Fleming M.D.   On: 09/02/2022 11:21   ECHOCARDIOGRAM COMPLETE  Result Date: 09/01/2022    ECHOCARDIOGRAM REPORT   Patient Name:   Samuel Blevins Date of Exam: 09/01/2022 Medical Rec #:  [id number]          Height:       69.0 in Accession #:    [id number]         Weight:       163.4 lb Date of Birth:  March 06, 1965          BSA:          1.896 m Patient Age:    57 years           BP:           113/72 mmHg Patient Gender: M                  HR:           103 bpm. Exam Location:  Inpatient Procedure: 2D Echo, Color Doppler and Cardiac Doppler Indications:    I50.9* Heart failure (unspecified)  History:        Patient has no prior history of Echocardiogram examinations.                 Risk Factors:Hypertension and ETOH.  Sonographer:    Irving Burton Senior RDCS Referring Phys: [phone] PAULA M GUENTHER IMPRESSIONS  1. Left ventricular ejection fraction, by estimation, is 60 to 65%. The left ventricle has normal function. The left ventricle has no regional wall motion abnormalities. There is mild  concentric left ventricular hypertrophy. Left ventricular diastolic parameters are consistent with Grade I diastolic dysfunction (impaired relaxation).  2. Right ventricular systolic function is normal. The right ventricular size is normal. There is normal pulmonary artery systolic pressure. The estimated right ventricular systolic pressure is 32.6 mmHg.  3. The mitral valve is grossly normal. No evidence of mitral valve regurgitation.  4. The aortic valve is tricuspid. Aortic valve regurgitation is not visualized.  5. The inferior vena cava is normal in size with greater than 50% respiratory variability, suggesting right atrial pressure of 3 mmHg. Comparison(s): No prior Echocardiogram. FINDINGS  Left Ventricle: Left ventricular ejection fraction, by estimation, is 60 to 65%. The left ventricle has normal function. The left ventricle has no regional wall motion abnormalities. The left ventricular internal cavity size was normal in size. There  is  mild concentric left ventricular hypertrophy. Left ventricular diastolic parameters are consistent with Grade I diastolic dysfunction (impaired relaxation). Right Ventricle: The right ventricular size is normal. No increase in right ventricular wall thickness. Right ventricular systolic function is normal. There is normal pulmonary artery systolic pressure. The tricuspid regurgitant velocity is 2.72 m/s, and  with an assumed right atrial pressure of 3 mmHg, the estimated right ventricular systolic pressure is 32.6 mmHg. Left Atrium: Left atrial size was normal in size. Right Atrium: Right atrial size was normal in size. Pericardium: Trivial pericardial effusion is present. The pericardial effusion is posterior to the left ventricle. Mitral Valve: The mitral valve is grossly normal. No evidence of mitral valve regurgitation. Tricuspid Valve: The tricuspid valve is grossly normal. Tricuspid valve regurgitation is mild. Aortic Valve: The aortic valve is tricuspid. Aortic  valve regurgitation is not visualized. Pulmonic Valve: The pulmonic valve was grossly normal. Pulmonic valve regurgitation is trivial. Aorta: The aortic root and ascending aorta are structurally normal, with no evidence of dilitation. Venous: The inferior vena cava is normal in size with greater than 50% respiratory variability, suggesting right atrial pressure of 3 mmHg. IAS/Shunts: No atrial level shunt detected by color flow Doppler.  LEFT VENTRICLE PLAX 2D LVIDd:         3.50 cm   Diastology LVIDs:         2.40 cm   LV e' medial:    7.40 cm/s LV PW:         1.30 cm   LV E/e' medial:  10.1 LV IVS:        1.20 cm   LV e' lateral:   9.03 cm/s LVOT diam:     1.80 cm   LV E/e' lateral: 8.3 LV SV:         45 LV SV Index:   23 LVOT Area:     2.54 cm  RIGHT VENTRICLE RV S prime:     14.90 cm/s TAPSE (M-mode): 1.6 cm LEFT ATRIUM             Index        RIGHT ATRIUM           Index LA diam:        3.80 cm 2.00 cm/m   RA Area:     11.60 cm LA Vol (A2C):   47.2 ml 24.90 ml/m  RA Volume:   22.90 ml  12.08 ml/m LA Vol (A4C):   38.0 ml 20.04 ml/m LA Biplane Vol: 43.0 ml 22.68 ml/m  AORTIC VALVE LVOT Vmax:   110.00 cm/s LVOT Vmean:  80.500 cm/s LVOT VTI:    0.175 m  AORTA Ao Root diam: 2.90 cm Ao Asc diam:  3.30 cm MITRAL VALVE                TRICUSPID VALVE MV Area (PHT): 3.33 cm     TR Peak grad:   29.6 mmHg MV Decel Time: 228 msec     TR Vmax:        272.00 cm/s MV E velocity: 74.90 cm/s MV A velocity: 102.00 cm/s  SHUNTS MV E/A ratio:  0.73         Systemic VTI:  0.18 m                             Systemic Diam: 1.80 cm Nona Dell MD Electronically signed by Nona Dell MD Signature Date/Time: 09/01/2022/12:31:38 PM  Final         Scheduled Meds:  acidophilus  2 capsule Oral TID   enoxaparin (LOVENOX) injection  40 mg Subcutaneous Q24H   feeding supplement  237 mL Oral BID BM   feeding supplement (PROSource TF20)  60 mL Per Tube Daily   folic acid  1 mg Oral Daily   gabapentin  100 mg Oral TID    lidocaine (PF)  10 mL Intradermal Once   lipase/protease/amylase  12,000 Units Oral TID AC   multivitamin with minerals  1 tablet Oral Daily   octreotide  200 mcg Subcutaneous Q12H   pantoprazole  40 mg Oral BID AC   sucralfate  1 g Oral TID PC & HS   thiamine  100 mg Oral Daily   Continuous Infusions:  feeding supplement (VITAL 1.5 CAL) 25 mL/hr at 09/03/22 0545     LOS: 7 days   Time spent:  Azucena Fallen, DO [facility]  If 7PM-7AM, please contact night-coverage [email or web address]  09/03/2022, 7:25 AM

## 2022-09-03 NOTE — Progress Notes (Signed)
Patient ID: Samuel Blevins, male   DOB: 09/30/1965, 57 y.o.   MRN: [id number]    Progress Note   Subjective   Day # 9 CC; chronic pancreatitis, admitted with persistent/recurrent infected peritonitis-nowproven to be pancreatic ascites  Octreotide twice daily Cefepime  Cortrack placed yesterday-tube feeds started -35 cc /hr  Patient is taking some p.o.'s, says no change in his pain with p.o. intake, feeding tube uncomfortable but understands he needs adequate nutrition, ongoing abdominal pain complaints.  No vomiting.       Objective   Vital signs in last 24 hours: Temp:  [97.9 F (36.6 C)-98.9 F (37.2 C)] 98.3 F (36.8 C) (07/09 1446) Pulse Rate:  [63-102] 100 (07/09 1447) Resp:  [15-18] 16 (07/09 1446) BP: (116-149)/(81-100) 149/100 (07/09 1447) SpO2:  [91 %-99 %] 99 % (07/09 1446) Weight:  [73 kg] 73 kg (07/09 0544) Last BM Date : 09/03/22 General: Well-developed older African-American male, uncomfortable appearing Heart:  Regular rate and rhythm; no murmurs Lungs: Respirations even and unlabored, lungs CTA bilaterally Abdomen:  Soft, But nontense, tender across the upper abdomen no rebound normal bowel sounds. Extremities:  Without edema. Neurologic:  Alert and oriented,  grossly normal neurologically. Psych:  Cooperative.  Flat affect  Intake/Output from previous day: 07/08 0701 - 07/09 0700 In: 297.8 [NG/GT:297.8] Out: 930 [Urine:930] Intake/Output this shift: Total I/O In: -  Out: 250 [Urine:250]  Lab Results: Recent Labs    09/01/22 0543 09/02/22 0540 09/03/22 0341  WBC 8.9 7.6 8.3  HGB 8.7* 8.0* 8.5*  HCT 26.7* 24.6* 26.2*  PLT 847* 789* 892*   BMET Recent Labs    09/01/22 0543 09/02/22 0540 09/03/22 0341  NA 133* 134* 131*  K 4.5 4.1 4.3  CL 105 106 103  CO2 21* 21* 22  GLUCOSE 118* 100* 123*  BUN 18 20 17   CREATININE 1.04 1.06 1.06  CALCIUM 8.3* 8.3* 8.1*   LFT Recent Labs    09/03/22 0341  PROT 6.4*  ALBUMIN 1.8*  AST 26   ALT 15  ALKPHOS 52  BILITOT 0.5   PT/INR No results for input(s): "LABPROT", "INR" in the last 72 hours.  Studies/Results: IR Paracentesis  Result Date: 09/02/2022 INDICATION: Patient with a history of alcohol abuse, chronic pancreatitis admitted with acute pancreatitis and found to have new onset ascites. Interventional radiology asked to perform a diagnostic and therapeutic paracentesis EXAM: ULTRASOUND GUIDED PARACENTESIS MEDICATIONS: 1% lidocaine 20 mL COMPLICATIONS: None immediate. PROCEDURE: Informed written consent was obtained from the patient after a discussion of the risks, benefits and alternatives to treatment. A timeout was performed prior to the initiation of the procedure. Initial ultrasound scanning demonstrates a large amount of ascites within the right lower abdominal quadrant. The right lower abdomen was prepped and draped in the usual sterile fashion. 1% lidocaine was used for local anesthesia. Following this, a 19 gauge, 7-cm, Yueh catheter was introduced. An ultrasound image was saved for documentation purposes. The paracentesis was performed. The catheter was removed and a dressing was applied. The patient tolerated the procedure well without immediate post procedural complication. FINDINGS: A total of approximately 1.8 L of blood-tinged fluid was removed. Samples were sent to the laboratory as requested by the clinical team. IMPRESSION: Successful ultrasound-guided paracentesis yielding 1.8 liters of peritoneal fluid. Procedure performed by Alwyn Ren NP and supervised by Dr. Lowella Dandy. Electronically Signed   By: Richarda Overlie M.D.   On: 09/02/2022 17:27   DG Abd Portable 1V  Result Date: 09/02/2022 CLINICAL  DATA:  57 year old male feeding tube placement. EXAM: PORTABLE ABDOMEN - 1 VIEW COMPARISON:  08/30/2022 and earlier. FINDINGS: Portable AP view at 1044 hours. Enteric feeding tube courses from the visible lower chest into the abdomen, crosses midline and conforms to the  duodenum C-loop. The tip is in the midline at the level of the distal duodenum. Paucity of bowel gas in the upper abdomen. Patchy right lung base opacity, not significantly changed from CT Abdomen and Pelvis on 08/26/2022. IMPRESSION: Enteric feeding tube tip at the level of the distal duodenum. Electronically Signed   By: Odessa Fleming M.D.   On: 09/02/2022 11:21       Assessment / Plan:    #58 57 year old male with history of EtOH abuse, chronic pancreatitis and history of recurrent pancreatitis.  Currently admitted with persistent/recurrent infected peritonitis  SAAG not consistent with portal hypertension Amylase and ascitic fluid significantly elevated consistent with pancreatic ascites. Suspect pancreatic duct disruption as underlying etiology  Repeat paracentesis yesterday-lines greater than 10,000, lipase pending, Gram stain no organisms Cell count improved  Afebrile, no leukocytosis  Plan; octreotide dose was increased this morning, continue octreotide Continue IV antibiotics  Advance postpyloric feeds to goal rate  Will plan for repeat paracentesis on Thursday   Principal Problem:   Sepsis (HCC) Active Problems:   Alcohol dependence syndrome (HCC)   Acute on chronic pancreatitis (HCC)   Pancreatitis   Acute pancreatitis   Other ascites   Protein-calorie malnutrition, severe   Alcoholic liver disease (HCC)   Pancreatic ascites   Anasarca     LOS: 7 days   Asim Gersten PA-C 09/03/2022, 4:47 PM

## 2022-09-04 DIAGNOSIS — R6520 Severe sepsis without septic shock: Secondary | ICD-10-CM

## 2022-09-04 DIAGNOSIS — A419 Sepsis, unspecified organism: Secondary | ICD-10-CM | POA: Diagnosis not present

## 2022-09-04 LAB — CULTURE, BODY FLUID W GRAM STAIN -BOTTLE

## 2022-09-04 LAB — COMPREHENSIVE METABOLIC PANEL
ALT: 15 U/L (ref 0–44)
AST: 23 U/L (ref 15–41)
Albumin: 1.8 g/dL — ABNORMAL LOW (ref 3.5–5.0)
Alkaline Phosphatase: 51 U/L (ref 38–126)
Anion gap: 6 (ref 5–15)
BUN: 15 mg/dL (ref 6–20)
CO2: 22 mmol/L (ref 22–32)
Calcium: 8.1 mg/dL — ABNORMAL LOW (ref 8.9–10.3)
Chloride: 104 mmol/L (ref 98–111)
Creatinine, Ser: 1.02 mg/dL (ref 0.61–1.24)
GFR, Estimated: >60 mL/min (ref >60)
Glucose, Bld: 145 mg/dL — ABNORMAL HIGH (ref 70–99)
Potassium: 4.7 mmol/L (ref 3.5–5.1)
Sodium: 132 mmol/L — ABNORMAL LOW (ref 135–145)
Total Bilirubin: 0.3 mg/dL (ref 0.3–1.2)
Total Protein: 6.5 g/dL (ref 6.5–8.1)

## 2022-09-04 LAB — CBC
HCT: 26.7 % — ABNORMAL LOW (ref 39.0–52.0)
Hemoglobin: 8.7 g/dL — ABNORMAL LOW (ref 13.0–17.0)
MCH: 28.8 pg (ref 26.0–34.0)
MCHC: 32.6 g/dL (ref 30.0–36.0)
MCV: 88.4 fL (ref 80.0–100.0)
Platelets: 869 10*3/uL — ABNORMAL HIGH (ref 150–400)
RBC: 3.02 MIL/uL — ABNORMAL LOW (ref 4.22–5.81)
RDW: 20.7 % — ABNORMAL HIGH (ref 11.5–15.5)
WBC: 9.6 10*3/uL (ref 4.0–10.5)
nRBC: 0 % (ref 0.0–0.2)

## 2022-09-04 LAB — GLUCOSE, CAPILLARY
Glucose-Capillary: 141 mg/dL — ABNORMAL HIGH (ref 70–99)
Glucose-Capillary: 142 mg/dL — ABNORMAL HIGH (ref 70–99)
Glucose-Capillary: 129 mg/dL — ABNORMAL HIGH (ref 70–99)
Glucose-Capillary: 121 mg/dL — ABNORMAL HIGH (ref 70–99)

## 2022-09-04 LAB — MAGNESIUM: Magnesium: 1.8 mg/dL (ref 1.7–2.4)

## 2022-09-04 LAB — LIPASE, FLUID: Lipase-Fluid: >60600 U/L

## 2022-09-04 LAB — PHOSPHORUS: Phosphorus: 3.4 mg/dL (ref 2.5–4.6)

## 2022-09-04 NOTE — Progress Notes (Signed)
Nutrition Follow-up  DOCUMENTATION CODES:   Severe malnutrition in context of chronic illness  INTERVENTION:  - Vital 1.5 at 15 mL/hr goal rate of 55 mL/hour (1320 mL goal daily volume)  -Provide PROSource TF20 60 mL daily per tube -Provides: 2060 kcal, 109 grams of protein, 1003 mL H2O daily  - Continue Regular diet, 1500 mL  NUTRITION DIAGNOSIS:   Severe Malnutrition related to chronic illness (pancreatitis) as evidenced by severe fat depletion, severe muscle depletion, moderate fat depletion, moderate muscle depletion.  GOAL:   Patient will meet greater than or equal to 90% of their needs  MONITOR:   PO intake, Supplement acceptance, Labs, Weight trends, TF tolerance, I & O's  REASON FOR ASSESSMENT:   Consult Enteral/tube feeding initiation and management  ASSESSMENT:   57 year old male with PMHx of EtOH abuse, chronic pancreatitis, chronic pancreatic insufficiency, HTN who is admitted with persistent/recurrent infected peritonitis now with infected pancreatic ascites likely from pancreatic duct disruption.  Meds reviewed: risaquad, folic acid, creon, MVI, octreotide, carafate, thiamine. Labs reviewed: Na low.   Pt continues with post-pyloric cortrak tube in place. Pt also on Regular diet with minimal intakes. Spoke with RN who reports that the pt is tolerating TF advancement well with no issues. The pt will meet his needs once EN has reached goal rate. It is currently running at 45 mL/hr. RD will continue to monitor EN tolerance and PO intakes.   Diet Order:   Diet Order             Diet regular Room service appropriate? Yes; Fluid consistency: Thin; Fluid restriction: 1500 mL Fluid  Diet effective now                   EDUCATION NEEDS:   No education needs have been identified at this time  Skin:  Skin Assessment: Reviewed RN Assessment  Last BM:  7/9 - type 5  Height:   Ht Readings from Last 1 Encounters:  08/26/22 5\' 9"  (1.753 m)    Weight:    Wt Readings from Last 1 Encounters:  09/04/22 72.5 kg    Ideal Body Weight:  72.7 kg  BMI:  Body mass index is 23.61 kg/m.  Estimated Nutritional Needs:   Kcal:  2100-2300  Protein:  105-115 grams  Fluid:  2.1-2.3 L/day  Bethann Humble, RD, LDN, CNSC.

## 2022-09-04 NOTE — Progress Notes (Signed)
Patient ID: Samuel Blevins, male   DOB: 10/28/1965, 57 y.o.   MRN: [id number]    Progress Note   Subjective   Day # 10 CC;  Octreotide 200 TID  Cortrak feedings infusing at 55 cc/hr  Patient says his pain is not as bad today has been taking pain meds every 4 hours.  Tolerating some p.o.'s and seems to be tolerating tube feeds without difficulty.  Does not feel any more distended.   Objective   Vital signs in last 24 hours: Temp:  [98.3 F (36.8 C)-99.1 F (37.3 C)] 99.1 F (37.3 C) (07/10 0820) Pulse Rate:  [97-102] 99 (07/10 0820) Resp:  [16-20] 18 (07/10 0820) BP: (119-149)/(85-100) 119/87 (07/10 0820) SpO2:  [98 %-100 %] 100 % (07/10 0820) Weight:  [72.5 kg] 72.5 kg (07/10 0500) Last BM Date : 09/03/22 General:   AA male in NAD Heart:  Regular rate and rhythm; no murmurs Lungs: Respirations even and unlabored, lungs CTA bilaterally Abdomen:  Soft, tender across the upper abdomen with some guarding no rebound.  Full feeling -normal bowel sounds. Extremities:  Without edema. Neurologic:  Alert and oriented,  grossly normal neurologically. Psych:  Cooperative. Normal mood and affect.  Intake/Output from previous day: 07/09 0701 - 07/10 0700 In: 1073.1 [P.O.:300; NG/GT:760.5; IV Piggyback:12.6] Out: 400 [Urine:400] Intake/Output this shift: Total I/O In: -  Out: 200 [Urine:200]  Lab Results: Recent Labs    09/02/22 0540 09/03/22 0341 09/04/22 0314  WBC 7.6 8.3 9.6  HGB 8.0* 8.5* 8.7*  HCT 24.6* 26.2* 26.7*  PLT 789* 892* 869*   BMET Recent Labs    09/02/22 0540 09/03/22 0341 09/04/22 0314  NA 134* 131* 132*  K 4.1 4.3 4.7  CL 106 103 104  CO2 21* 22 22  GLUCOSE 100* 123* 145*  BUN 20 17 15   CREATININE 1.06 1.06 1.02  CALCIUM 8.3* 8.1* 8.1*   LFT Recent Labs    09/04/22 0314  PROT 6.5  ALBUMIN 1.8*  AST 23  ALT 15  ALKPHOS 51  BILITOT 0.3   PT/INR No results for input(s): "LABPROT", "INR" in the last 72 hours.  Studies/Results: IR  Paracentesis  Result Date: 09/02/2022 INDICATION: Patient with a history of alcohol abuse, chronic pancreatitis admitted with acute pancreatitis and found to have new onset ascites. Interventional radiology asked to perform a diagnostic and therapeutic paracentesis EXAM: ULTRASOUND GUIDED PARACENTESIS MEDICATIONS: 1% lidocaine 20 mL COMPLICATIONS: None immediate. PROCEDURE: Informed written consent was obtained from the patient after a discussion of the risks, benefits and alternatives to treatment. A timeout was performed prior to the initiation of the procedure. Initial ultrasound scanning demonstrates a large amount of ascites within the right lower abdominal quadrant. The right lower abdomen was prepped and draped in the usual sterile fashion. 1% lidocaine was used for local anesthesia. Following this, a 19 gauge, 7-cm, Yueh catheter was introduced. An ultrasound image was saved for documentation purposes. The paracentesis was performed. The catheter was removed and a dressing was applied. The patient tolerated the procedure well without immediate post procedural complication. FINDINGS: A total of approximately 1.8 L of blood-tinged fluid was removed. Samples were sent to the laboratory as requested by the clinical team. IMPRESSION: Successful ultrasound-guided paracentesis yielding 1.8 liters of peritoneal fluid. Procedure performed by Alwyn Ren NP and supervised by Dr. Lowella Dandy. Electronically Signed   By: Richarda Overlie M.D.   On: 09/02/2022 17:27       Assessment / Plan:    #34 57 year old  African-American male with history of EtOH abuse, chronic pancreatitis currently admitted with persistent/recurrent peritonitis type picture with fluid analysis consistent with pancreatic ascites.  Suspect pancreatic ductal disruption  He completed an 8-day course of cefepime Currently on octreotide 200 3 times daily dose was increased yesterday.   Cortack feedings have been started and he is tolerating  Last  paracentesis in 09/2022-with improved cell counts, Gram stain no organisms, and markedly elevated amylase and lipase  #2 anemia stable  Plan; continue current regimen with plans for repeat paracentesis on Friday, 09/06/2022.    Principal Problem:   Sepsis (HCC) Active Problems:   Alcohol dependence syndrome (HCC)   Acute on chronic pancreatitis (HCC)   Pancreatitis   Acute pancreatitis   Other ascites   Protein-calorie malnutrition, severe   Alcoholic liver disease (HCC)   Pancreatic ascites   Anasarca     LOS: 8 days   Kashawna Manzer EsterwoodPA-C  09/04/2022, 11:14 AM

## 2022-09-04 NOTE — Progress Notes (Signed)
PROGRESS NOTE    Samuel Blevins  NFA:[id number] DOB: 04/10/1965 DOA: 08/26/2022 PCP: [name], MD   Brief Narrative:  57 year old with history of recurrent pancreatitis, chronic pancreatic insufficiency, HTN recurrent alcohol use comes to the hospital with complaints of abdominal pain, nausea, vomiting and diarrhea.  Patient was also found to have acute recurrent alcoholic pancreatitis.  Initially patient had left AMA about 4-5 days prior to this admission.  Patient readmitted for acute recurrent alcoholic pancreatitis with worsening ascites, initial labs of ascites fluid indicate elevated amylase concerning for pancreatic duct disruption.  GI continues to follow, hospitalist called for admission.   Assessment & Plan:   Principal Problem:   Sepsis (HCC) Active Problems:   Alcohol dependence syndrome (HCC)   Acute on chronic pancreatitis (HCC)   Pancreatitis   Acute pancreatitis   Other ascites   Protein-calorie malnutrition, severe   Alcoholic liver disease (HCC)   Pancreatic ascites   Anasarca  Decompensated liver cirrhosis with ascites Sepsis secondary to suspected SBP versus infected pancreatic ascites Hypoalbuminemia Hypovolemic hyponatremia -Sepsis evidenced by tachycardia and leukocytosis.  Source presumed pancreatic duct disruption(elevated amylase on paracentesis) and presumed SBP. -Paracentesis 7/1 - 3L; 7/5 - 3L; 7/8 2L, next paracentesis scheduled for Thursday per GI note. -Blood culture, paracentesis fluid culture remain negative -Urine culture shows multiple species, likely contaminant -Previous notes from hospitalist as well as GI suggest to continue cefepime however patient is not on cefepime, verified with pharmacist that patient received 8 days of antibiotics.  I have sent a message to GI and will defer to them if they would like to resume cefepime. -Continue octreotide, increased dose per GI -Echo: EF 60-65% with normal LVF and grade 1 diastolic  dysfunction -Possible need for ERCP/pancreatic duct stenting per GI given questionable pancreatic duct disruption (amylase in ascites) -Core track placed 7/5 Removed 7/6 as it was nonfunctional/could not be cleared -7/8 cortrak placed in distal duodenum per GI recs - continue tube feeds(monitor for refeeding syndrome)   Recurrent alcohol induced pancreatitis. Nausea vomiting, resolved -Secondary to chronic alcohol use/abuse -Continue conservative management IV fluids, p.o. intake as tolerated, pain control avoiding IV narcotics, antiemetics, PPI   Severe protein caloric malnutrition High risk for refeeding syndrome -Complicated by chronic alcohol use -Core track placed 7/5 -dysfunctional 7/6 -replaced 09/02/2022, noted to be distal to the proximal duodenum. -Continue to follow electrolytes, mag/Phos.  Repleted as appropriate   Alcohol abuse - Alcohol withdrawal protocol completed, currently outside the window for any further withdrawal symptoms.  No further need for benzos IV versus p.o.    Essential hypertension -Currently well-controlled off medications, as needed hydralazine for hypertensive events   GERD/duodenitis -Continue PPI  DVT prophylaxis: enoxaparin (LOVENOX) injection 40 mg Start: 08/26/22 1700   Code Status: Full Code  Family Communication:  None present at bedside.  Plan of care discussed with patient in length and he/she verbalized understanding and agreed with it.  Status is: Inpatient Remains inpatient appropriate because: Needs ERCP, timing to be determined by GI.   Estimated body mass index is 23.61 kg/m as calculated from the following:   Height as of this encounter: 5\' 9"  (1.753 m).   Weight as of this encounter: 72.5 kg.    Nutritional Assessment: Body mass index is 23.61 kg/m.Marland Kitchen Seen by dietician.  I agree with the assessment and plan as outlined below: Nutrition Status: Nutrition Problem: Severe Malnutrition Etiology: chronic illness  (pancreatitis) Signs/Symptoms: severe fat depletion, severe muscle depletion, moderate fat depletion, moderate muscle  depletion Interventions: Refer to RD note for recommendations  . Skin Assessment: I have examined the patient's skin and I agree with the wound assessment as performed by the wound care RN as outlined below:    Consultants:  GI  Procedures:  As above  Antimicrobials:  Anti-infectives (From admission, onward)    Start     Dose/Rate Route Frequency Ordered Stop   08/26/22 1800  ceFEPIme (MAXIPIME) 2 g in sodium chloride 0.9 % 100 mL IVPB        2 g 200 mL/hr over 30 Minutes Intravenous Every 8 hours 08/26/22 1133 09/02/22 1132   08/26/22 1000  ceFEPIme (MAXIPIME) 2 g in sodium chloride 0.9 % 100 mL IVPB        2 g 200 mL/hr over 30 Minutes Intravenous  Once 08/26/22 0953 08/26/22 1051   08/26/22 1000  metroNIDAZOLE (FLAGYL) IVPB 500 mg        500 mg 100 mL/hr over 60 Minutes Intravenous  Once 08/26/22 0953 08/26/22 1213         Subjective: Patient seen and examined.  He denies any complaint, any abdominal pain.  Objective: Vitals:   09/03/22 1941 09/04/22 0415 09/04/22 0500 09/04/22 0820  BP: (!) 136/95 126/85  119/87  Pulse: (!) 101 97  99  Resp: 20 17  18   Temp: 98.8 F (37.1 C) 98.4 F (36.9 C)  99.1 F (37.3 C)  TempSrc:  Oral  Oral  SpO2: 98% 99%  100%  Weight:   72.5 kg   Height:        Intake/Output Summary (Last 24 hours) at 09/04/2022 1106 Last data filed at 09/04/2022 1050 Gross per 24 hour  Intake 1073.05 ml  Output 350 ml  Net 723.05 ml   Filed Weights   09/02/22 0438 09/03/22 0544 09/04/22 0500  Weight: 74 kg 73 kg 72.5 kg    Examination:  General exam: Appears calm and comfortable  Respiratory system: Clear to auscultation. Respiratory effort normal. Cardiovascular system: S1 & S2 heard, RRR. No JVD, murmurs, rubs, gallops or clicks. No pedal edema. Gastrointestinal system: Abdomen is moderately distended, firm and  generalized tenderness, more pronounced at right upper quadrant, epigastrium and left upper quadrant area. No organomegaly or masses felt. Normal bowel sounds heard. Central nervous system: Alert and oriented. No focal neurological deficits. Extremities: Symmetric 5 x 5 power. Skin: No rashes, lesions or ulcers  Data Reviewed: I have personally reviewed following labs and imaging studies  CBC: Recent Labs  Lab 08/31/22 0421 09/01/22 0543 09/02/22 0540 09/03/22 0341 09/04/22 0314  WBC 10.1 8.9 7.6 8.3 9.6  HGB 8.9* 8.7* 8.0* 8.5* 8.7*  HCT 27.5* 26.7* 24.6* 26.2* 26.7*  MCV 86.8 87.8 89.8 89.1 88.4  PLT 705* 847* 789* 892* 869*   Basic Metabolic Panel: Recent Labs  Lab 08/31/22 0421 09/01/22 0543 09/02/22 0540 09/03/22 0341 09/04/22 0314  NA 134* 133* 134* 131* 132*  K 4.5 4.5 4.1 4.3 4.7  CL 104 105 106 103 104  CO2 20* 21* 21* 22 22  GLUCOSE 107* 118* 100* 123* 145*  BUN 16 18 20 17 15   CREATININE 0.90 1.04 1.06 1.06 1.02  CALCIUM 8.4* 8.3* 8.3* 8.1* 8.1*  MG 1.7 1.7 1.5* 1.4* 1.8  PHOS 3.7 3.3 3.7 3.9 3.4   GFR: Estimated Creatinine Clearance: 78.9 mL/min (by C-G formula based on SCr of 1.02 mg/dL). Liver Function Tests: Recent Labs  Lab 08/31/22 0421 09/01/22 0543 09/02/22 0540 09/03/22 0341 09/04/22 0314  AST  30 28 24 26 23   ALT 12 15 15 15 15   ALKPHOS 50 54 48 52 51  BILITOT 1.7* 0.8 0.5 0.5 0.3  PROT 6.0* 6.2* 5.9* 6.4* 6.5  ALBUMIN 2.4* 2.1* 1.9* 1.8* 1.8*   No results for input(s): "LIPASE", "AMYLASE" in the last 168 hours. No results for input(s): "AMMONIA" in the last 168 hours. Coagulation Profile: No results for input(s): "INR", "PROTIME" in the last 168 hours. Cardiac Enzymes: No results for input(s): "CKTOTAL", "CKMB", "CKMBINDEX", "TROPONINI" in the last 168 hours. BNP (last 3 results) No results for input(s): "PROBNP" in the last 8760 hours. HbA1C: No results for input(s): "HGBA1C" in the last 72 hours. CBG: Recent Labs  Lab  09/03/22 1326 09/03/22 1558 09/03/22 1944 09/03/22 2352 09/04/22 0413  GLUCAP 125* 128* 144* 142* 141*   Lipid Profile: No results for input(s): "CHOL", "HDL", "LDLCALC", "TRIG", "CHOLHDL", "LDLDIRECT" in the last 72 hours. Thyroid Function Tests: No results for input(s): "TSH", "T4TOTAL", "FREET4", "T3FREE", "THYROIDAB" in the last 72 hours. Anemia Panel: No results for input(s): "VITAMINB12", "FOLATE", "FERRITIN", "TIBC", "IRON", "RETICCTPCT" in the last 72 hours. Sepsis Labs: No results for input(s): "PROCALCITON", "LATICACIDVEN" in the last 168 hours.  Recent Results (from the past 240 hour(s))  Blood culture (routine x 2)     Status: None   Collection Time: 08/26/22  8:47 AM   Specimen: BLOOD  Result Value Ref Range Status   Specimen Description BLOOD SITE NOT SPECIFIED  Final   Special Requests   Final    BOTTLES DRAWN AEROBIC AND ANAEROBIC Blood Culture results may not be optimal due to an inadequate volume of blood received in culture bottles   Culture   Final    NO GROWTH 5 DAYS Performed at [facility], [address], [place], [place] [address]    Report Status 08/31/2022 FINAL  Final  Blood culture (routine x 2)     Status: None   Collection Time: 08/26/22  8:52 AM   Specimen: BLOOD  Result Value Ref Range Status   Specimen Description BLOOD SITE NOT SPECIFIED  Final   Special Requests   Final    BOTTLES DRAWN AEROBIC AND ANAEROBIC Blood Culture adequate volume   Culture   Final    NO GROWTH 5 DAYS Performed at [facility], [address] [place], [place] [address]    Report Status 08/31/2022 FINAL  Final  Resp panel by RT-PCR (RSV, Flu A&B, Covid) Anterior Nasal Swab     Status: None   Collection Time: 08/26/22  9:52 AM   Specimen: Anterior Nasal Swab  Result Value Ref Range Status   SARS Coronavirus 2 by RT PCR NEGATIVE NEGATIVE Final   Influenza A by PCR NEGATIVE NEGATIVE Final   Influenza B by PCR NEGATIVE NEGATIVE Final    Comment:  (NOTE) The Xpert Xpress SARS-CoV-2/FLU/RSV plus assay is intended as an aid in the diagnosis of influenza from Nasopharyngeal swab specimens and should not be used as a sole basis for treatment. Nasal washings and aspirates are unacceptable for Xpert Xpress SARS-CoV-2/FLU/RSV testing.  Fact Sheet for Patients: BloggerCourse.com  Fact Sheet for Healthcare Providers: SeriousBroker.it  This test is not yet approved or cleared by the [place] FDA and has been authorized for detection and/or diagnosis of SARS-CoV-2 by FDA under an Emergency Use Authorization (EUA). This EUA will remain in effect (meaning this test can be used) for the duration of the COVID-19 declaration under Section 564(b)(1) of the Act,  21 U.S.C. section 360bbb-3(b)(1), unless the authorization is terminated or revoked.     Resp Syncytial Virus by PCR NEGATIVE NEGATIVE Final    Comment: (NOTE) Fact Sheet for Patients: BloggerCourse.com  Fact Sheet for Healthcare Providers: SeriousBroker.it  This test is not yet approved or cleared by the [place] FDA and has been authorized for detection and/or diagnosis of SARS-CoV-2 by FDA under an Emergency Use Authorization (EUA). This EUA will remain in effect (meaning this test can be used) for the duration of the COVID-19 declaration under Section 564(b)(1) of the Act, 21 U.S.C. section 360bbb-3(b)(1), unless the authorization is terminated or revoked.  Performed at [facility], [address], [place], [place] [address]   Gram stain     Status: None   Collection Time: 08/26/22  2:10 PM   Specimen: Abdomen; Peritoneal Fluid  Result Value Ref Range Status   Specimen Description PERITONEAL  Final   Special Requests NONE  Final   Gram Stain   Final    ABUNDANT WBC PRESENT,BOTH PMN AND MONONUCLEAR NO ORGANISMS SEEN Performed at [facility],  [address], [place], [place] [address]    Report Status 08/26/2022 FINAL  Final  Culture, body fluid w Gram Stain-bottle     Status: None   Collection Time: 08/26/22  2:10 PM   Specimen: Peritoneal Washings  Result Value Ref Range Status   Specimen Description PERITONEAL  Final   Special Requests NONE  Final   Culture   Final    NO GROWTH 5 DAYS Performed at [facility], [address] [place], [place] [address]    Report Status 08/31/2022 FINAL  Final  Urine Culture (for pregnant, neutropenic or urologic patients or patients with an indwelling urinary catheter)     Status: Abnormal   Collection Time: 08/27/22  5:15 AM   Specimen: Urine, Clean Catch  Result Value Ref Range Status   Specimen Description URINE, CLEAN CATCH  Final   Special Requests   Final    Normal Performed at [facility], [address] [place], [place] [address]    Culture MULTIPLE SPECIES PRESENT, SUGGEST RECOLLECTION (A)  Final   Report Status 08/29/2022 FINAL  Final  Acid Fast Smear (AFB)     Status: None   Collection Time: 08/30/22  9:41 AM   Specimen: Abdomen; Peritoneal Fluid  Result Value Ref Range Status   AFB Specimen Processing Concentration  Final   Acid Fast Smear Negative  Final    Comment: (NOTE) Performed At: [facility] [address] [place], [place] [id number] Jolene Schimke MD XB:[id number]    Source (AFB) PERITONEAL  Final    Comment: Performed at [facility], [address] [place], [place] [address]  Culture, body fluid w Gram Stain-bottle     Status: None   Collection Time: 08/30/22  9:41 AM   Specimen: Abdomen  Result Value Ref Range Status   Specimen Description ABDOMEN  Final   Special Requests PERITONEAL  Final   Culture   Final    NO GROWTH 5 DAYS Performed at [facility], [address], [place], [place] [address]    Report Status 09/04/2022 FINAL  Final  Gram stain     Status: None   Collection Time: 08/30/22  9:41 AM   Specimen:  Abdomen  Result Value Ref Range Status   Specimen Description ABDOMEN  Final   Special Requests PERITONEAL  Final  Gram Stain   Final    WBC PRESENT, PREDOMINANTLY PMN NO ORGANISMS SEEN CYTOSPIN SMEAR Performed at [facility], [address], [place], [place] [address]    Report Status 08/30/2022 FINAL  Final  Gram stain     Status: None   Collection Time: 09/02/22  2:47 PM   Specimen: Abdomen; Peritoneal Fluid  Result Value Ref Range Status   Specimen Description ABDOMEN  Final   Special Requests NONE  Final   Gram Stain   Final    FEW WBC SEEN NO ORGANISMS SEEN Performed at [facility], [address] [place], [place] [address]    Report Status 09/02/2022 FINAL  Final     Radiology Studies: IR Paracentesis  Result Date: 09/02/2022 INDICATION: Patient with a history of alcohol abuse, chronic pancreatitis admitted with acute pancreatitis and found to have new onset ascites. Interventional radiology asked to perform a diagnostic and therapeutic paracentesis EXAM: ULTRASOUND GUIDED PARACENTESIS MEDICATIONS: 1% lidocaine 20 mL COMPLICATIONS: None immediate. PROCEDURE: Informed written consent was obtained from the patient after a discussion of the risks, benefits and alternatives to treatment. A timeout was performed prior to the initiation of the procedure. Initial ultrasound scanning demonstrates a large amount of ascites within the right lower abdominal quadrant. The right lower abdomen was prepped and draped in the usual sterile fashion. 1% lidocaine was used for local anesthesia. Following this, a 19 gauge, 7-cm, Yueh catheter was introduced. An ultrasound image was saved for documentation purposes. The paracentesis was performed. The catheter was removed and a dressing was applied. The patient tolerated the procedure well without immediate post procedural complication. FINDINGS: A total of approximately 1.8 L of blood-tinged fluid was removed. Samples were sent to the  laboratory as requested by the clinical team. IMPRESSION: Successful ultrasound-guided paracentesis yielding 1.8 liters of peritoneal fluid. Procedure performed by Alwyn Ren NP and supervised by Dr. Lowella Dandy. Electronically Signed   By: Richarda Overlie M.D.   On: 09/02/2022 17:27    Scheduled Meds:  acidophilus  2 capsule Oral TID   enoxaparin (LOVENOX) injection  40 mg Subcutaneous Q24H   feeding supplement  237 mL Oral BID BM   feeding supplement (PROSource TF20)  60 mL Per Tube Daily   folic acid  1 mg Oral Daily   gabapentin  100 mg Oral TID   lidocaine (PF)  10 mL Intradermal Once   lipase/protease/amylase  12,000 Units Oral TID AC   multivitamin with minerals  1 tablet Oral Daily   octreotide  200 mcg Subcutaneous Q12H   pantoprazole  40 mg Oral BID AC   sucralfate  1 g Oral TID PC & HS   thiamine  100 mg Oral Daily   Continuous Infusions:  feeding supplement (VITAL 1.5 CAL) 45 mL/hr at 09/04/22 0543     LOS: 8 days   Hughie Closs, MD [facility]  09/04/2022, 11:06 AM   *Please note that this is a verbal dictation therefore any spelling or grammatical errors are due to the "Dragon Medical One" system interpretation.  Please page via Amion and do not message via secure chat for urgent patient care matters. Secure chat can be used for non urgent patient care matters.  How to contact the [facility] Attending or Consulting provider 7A - 7P or covering provider during after hours 7P -7A, for this patient?  Check the care team in [facility] and look for a) attending/consulting TR[facility] provider listed and b) the [facility] team listed. Page  or secure chat 7A-7P. Log into [email or web address] and use [place]'s universal password to access. If you do not have the password, please contact the hospital operator. Locate the [facility] provider you are looking for under [facility] and page to a number that you can be directly reached. If you still have difficulty reaching the provider, please page the [facility]  (Director on Call) for the Hospitalists listed on amion for assistance.

## 2022-09-05 ENCOUNTER — Inpatient Hospital Stay (HOSPITAL_COMMUNITY): Payer: MEDICAID

## 2022-09-05 DIAGNOSIS — E43 Unspecified severe protein-calorie malnutrition: Secondary | ICD-10-CM

## 2022-09-05 DIAGNOSIS — R6520 Severe sepsis without septic shock: Secondary | ICD-10-CM | POA: Diagnosis not present

## 2022-09-05 DIAGNOSIS — K709 Alcoholic liver disease, unspecified: Secondary | ICD-10-CM | POA: Diagnosis not present

## 2022-09-05 DIAGNOSIS — A419 Sepsis, unspecified organism: Secondary | ICD-10-CM | POA: Diagnosis not present

## 2022-09-05 DIAGNOSIS — R188 Other ascites: Secondary | ICD-10-CM | POA: Diagnosis not present

## 2022-09-05 DIAGNOSIS — K8689 Other specified diseases of pancreas: Secondary | ICD-10-CM | POA: Diagnosis not present

## 2022-09-05 LAB — CBC
HCT: 26.6 % — ABNORMAL LOW (ref 39.0–52.0)
Hemoglobin: 8.5 g/dL — ABNORMAL LOW (ref 13.0–17.0)
MCH: 27.7 pg (ref 26.0–34.0)
MCHC: 32 g/dL (ref 30.0–36.0)
MCV: 86.6 fL (ref 80.0–100.0)
Platelets: 879 10*3/uL — ABNORMAL HIGH (ref 150–400)
RBC: 3.07 MIL/uL — ABNORMAL LOW (ref 4.22–5.81)
RDW: 21.1 % — ABNORMAL HIGH (ref 11.5–15.5)
WBC: 13 10*3/uL — ABNORMAL HIGH (ref 4.0–10.5)
nRBC: 0 % (ref 0.0–0.2)

## 2022-09-05 LAB — CBC WITH DIFFERENTIAL/PLATELET
Abs Immature Granulocytes: 0.33 10*3/uL — ABNORMAL HIGH (ref 0.00–0.07)
Basophils Absolute: 0.1 10*3/uL (ref 0.0–0.1)
Basophils Relative: 0 %
Eosinophils Absolute: 0 10*3/uL (ref 0.0–0.5)
Eosinophils Relative: 0 %
HCT: 35.9 % — ABNORMAL LOW (ref 39.0–52.0)
Hemoglobin: 11.6 g/dL — ABNORMAL LOW (ref 13.0–17.0)
Immature Granulocytes: 1 %
Lymphocytes Relative: 3 %
Lymphs Abs: 0.8 10*3/uL (ref 0.7–4.0)
MCH: 28.2 pg (ref 26.0–34.0)
MCHC: 32.3 g/dL (ref 30.0–36.0)
MCV: 87.3 fL (ref 80.0–100.0)
Monocytes Absolute: 1.8 10*3/uL — ABNORMAL HIGH (ref 0.1–1.0)
Monocytes Relative: 8 %
Neutro Abs: 21.2 10*3/uL — ABNORMAL HIGH (ref 1.7–7.7)
Neutrophils Relative %: 88 %
Platelets: 1107 10*3/uL (ref 150–400)
RBC: 4.11 MIL/uL — ABNORMAL LOW (ref 4.22–5.81)
RDW: 21.5 % — ABNORMAL HIGH (ref 11.5–15.5)
WBC: 24.2 10*3/uL — ABNORMAL HIGH (ref 4.0–10.5)
nRBC: 0 % (ref 0.0–0.2)

## 2022-09-05 LAB — GLUCOSE, CAPILLARY
Glucose-Capillary: 135 mg/dL — ABNORMAL HIGH (ref 70–99)
Glucose-Capillary: 139 mg/dL — ABNORMAL HIGH (ref 70–99)
Glucose-Capillary: 140 mg/dL — ABNORMAL HIGH (ref 70–99)
Glucose-Capillary: 146 mg/dL — ABNORMAL HIGH (ref 70–99)
Glucose-Capillary: 189 mg/dL — ABNORMAL HIGH (ref 70–99)

## 2022-09-05 LAB — COMPREHENSIVE METABOLIC PANEL
ALT: 15 U/L (ref 0–44)
ALT: 19 U/L (ref 0–44)
AST: 27 U/L (ref 15–41)
AST: 30 U/L (ref 15–41)
Albumin: 1.9 g/dL — ABNORMAL LOW (ref 3.5–5.0)
Albumin: 2.2 g/dL — ABNORMAL LOW (ref 3.5–5.0)
Alkaline Phosphatase: 65 U/L (ref 38–126)
Alkaline Phosphatase: 80 U/L (ref 38–126)
Anion gap: 5 (ref 5–15)
Anion gap: 16 — ABNORMAL HIGH (ref 5–15)
BUN: 13 mg/dL (ref 6–20)
BUN: 17 mg/dL (ref 6–20)
CO2: 26 mmol/L (ref 22–32)
CO2: 21 mmol/L — ABNORMAL LOW (ref 22–32)
Calcium: 8 mg/dL — ABNORMAL LOW (ref 8.9–10.3)
Calcium: 8.6 mg/dL — ABNORMAL LOW (ref 8.9–10.3)
Chloride: 102 mmol/L (ref 98–111)
Chloride: 97 mmol/L — ABNORMAL LOW (ref 98–111)
Creatinine, Ser: 1.21 mg/dL (ref 0.61–1.24)
Creatinine, Ser: 1.8 mg/dL — ABNORMAL HIGH (ref 0.61–1.24)
GFR, Estimated: >60 mL/min (ref >60)
GFR, Estimated: 43 mL/min — ABNORMAL LOW (ref >60)
Glucose, Bld: 138 mg/dL — ABNORMAL HIGH (ref 70–99)
Glucose, Bld: 216 mg/dL — ABNORMAL HIGH (ref 70–99)
Potassium: 4.9 mmol/L (ref 3.5–5.1)
Potassium: 5.4 mmol/L — ABNORMAL HIGH (ref 3.5–5.1)
Sodium: 133 mmol/L — ABNORMAL LOW (ref 135–145)
Sodium: 134 mmol/L — ABNORMAL LOW (ref 135–145)
Total Bilirubin: 0.5 mg/dL (ref 0.3–1.2)
Total Bilirubin: 0.8 mg/dL (ref 0.3–1.2)
Total Protein: 7 g/dL (ref 6.5–8.1)
Total Protein: 8.4 g/dL — ABNORMAL HIGH (ref 6.5–8.1)

## 2022-09-05 LAB — MAGNESIUM
Magnesium: 1.6 mg/dL — ABNORMAL LOW (ref 1.7–2.4)
Magnesium: 1.6 mg/dL — ABNORMAL LOW (ref 1.7–2.4)

## 2022-09-05 LAB — PHOSPHORUS: Phosphorus: 2.9 mg/dL (ref 2.5–4.6)

## 2022-09-05 LAB — BRAIN NATRIURETIC PEPTIDE: B Natriuretic Peptide: 85.7 pg/mL (ref 0.0–100.0)

## 2022-09-05 MED ORDER — LACTATED RINGERS IV BOLUS
500.0000 mL | Freq: Once | INTRAVENOUS | Status: AC
  Administered 2022-09-05: 500 mL via INTRAVENOUS

## 2022-09-05 MED ORDER — HYDROMORPHONE HCL 1 MG/ML IJ SOLN
0.5000 mg | Freq: Once | INTRAMUSCULAR | Status: AC
  Administered 2022-09-05: 0.5 mg via INTRAVENOUS
  Filled 2022-09-05: qty 0.5

## 2022-09-05 MED ORDER — NALOXONE HCL 0.4 MG/ML IJ SOLN: 0.4000 mg | INTRAMUSCULAR | Status: DC | PRN

## 2022-09-05 MED ORDER — MAGNESIUM SULFATE 2 GM/50ML IV SOLN
2.0000 g | Freq: Once | INTRAVENOUS | Status: AC
  Administered 2022-09-06: 2 g via INTRAVENOUS
  Filled 2022-09-05: qty 50

## 2022-09-05 MED ORDER — HYDROMORPHONE HCL 1 MG/ML IJ SOLN
1.0000 mg | Freq: Once | INTRAMUSCULAR | Status: AC
  Administered 2022-09-05: 1 mg via INTRAVENOUS
  Filled 2022-09-05: qty 1

## 2022-09-05 MED ORDER — METOPROLOL TARTRATE 5 MG/5ML IV SOLN
5.0000 mg | Freq: Once | INTRAVENOUS | Status: AC
  Administered 2022-09-05: 5 mg via INTRAVENOUS
  Filled 2022-09-05: qty 5

## 2022-09-05 NOTE — Significant Event (Signed)
Rapid Response Event Note  Reason for Call : Tachycardia and tachypnea  Initial Focused Assessment:  I was notified by nursing staff for a 2nd assessment for Samuel Blevins's tachycardia and tachypnea. Samuel Blevins has recurrent pancreatitis with pain, N/V, diarrhea. He is sitting in a semi-fowlers position, awake, alert and appropriate with grunting respirations. Abdomen is distended, tender and warm to the touch. Skin is pink, warm and diaphoretic. BBS diminished bilaterally, short choppy respirations (22) due to guarding and abdominal distention. HR is 140s ST with no ectopy. Dilaudid recently ordered and given for pain. Pt states he feels better than 20 mins ago. Pt is due to paracentesis which was rescheduled for tomorrow 7/12.   During my initial assessment, Samuel Blevins's oxygen saturations were 79-80% on RA. Pt placed on Salter HFNC now at 8L to maintain sats >90%. Lopressor and PCXR ordered by Dr. Arlean Hopping. With new hypoxia and new frequent need for monitoring and reassessment and SIRS criteria (3), my recommendation is pt be transferred to PCU.   2205-98.5 F, HR 145 ST, 96/72(80), RR 24 with sats 91% on Salter HFNC at 8L.   Interventions:  -Orders received from Dr. Arlean Hopping (Lopressor, labs, [facility])        MD Notified: Dr. Arlean Hopping per primary RN Call Time: 2148 Arrival Time: 2152 End Time: 2225  Rose Fillers, RN

## 2022-09-05 NOTE — Progress Notes (Signed)
Patient ID: Samuel Blevins, male   DOB: [birth year], 57 y.o.   MRN: [id number]    Progress Note   Subjective   Day # 11 CC; chronic pancreatitis, admitted with persistent/recurrent diverticulitis since improved to be pancreatic ascites  Octreotide 200 SQ 3 times daily Completed course of cefepime  Tolerating Cortrak feedings Abdominal pain has improved since admission, stable since yesterday, no change in complaints, taking small amounts of p.o.- no  complaint of nausea or vomiting   Objective   Vital signs in last 24 hours: Temp:  [98 F (36.7 C)-99.7 F (37.6 C)] 99.7 F (37.6 C) (07/11 1602) Pulse Rate:  [100-117] 117 (07/11 1602) Resp:  [18] 18 (07/11 0412) BP: (113-139)/(86-93) 137/86 (07/11 1602) SpO2:  [98 %-100 %] 99 % (07/11 1602) Weight:  [70.2 kg] 70.2 kg (07/11 0412) Last BM Date : 09/05/22 General:    Older African-American male in NAD Heart:  Regular rate and rhythm; no murmurs Lungs: Respirations even and unlabored, lungs CTA bilaterally Abdomen:  Soft, nontense ascites, tender across the upper abdomen ,bowel sounds present Extremities:  Without edema. Neurologic:  Alert and oriented,  grossly normal neurologically. Psych:  Cooperative. Normal mood and affect.  Intake/Output from previous day: 07/10 0701 - 07/11 0700 In: -  Out: 300 [Urine:300] Intake/Output this shift: Total I/O In: -  Out: 180 [Urine:180]  Lab Results: Recent Labs    09/03/22 0341 09/04/22 0314 09/05/22 1026  WBC 8.3 9.6 13.0*  HGB 8.5* 8.7* 8.5*  HCT 26.2* 26.7* 26.6*  PLT 892* 869* 879*   BMET Recent Labs    09/03/22 0341 09/04/22 0314 09/05/22 1026  NA 131* 132* 133*  K 4.3 4.7 4.9  CL 103 104 102  CO2 22 22 26   GLUCOSE 123* 145* 138*  BUN 17 15 13   CREATININE 1.06 1.02 1.21  CALCIUM 8.1* 8.1* 8.0*   LFT Recent Labs    09/05/22 1026  PROT 7.0  ALBUMIN 1.9*  AST 27  ALT 15  ALKPHOS 65  BILITOT 0.5   PT/INR No results for input(s): "LABPROT", "INR"  in the last 72 hours.  Studies/Results: No results found.     Assessment / Plan:    #73 57 year old African-American male with history of chronic EtOH abuse, chronic pancreatitis admitted with persistent/recurrent colitis type picture and to be pancreatic ascites, with suspected pancreatic ductal disruption  He has been started on octreotide this week  Last paracentesis 09/02/2022-1.8 L blood-tinged fluid removed, cell counts improved, stable negative, markedly elevated amylase and lipase and fluid  #2 nutrition-tolerating cortrack feedings at goal rate And some liquids  #3 anemia stable  Plan; continue current regimen Will schedule for repeat paracentesis tomorrow - remove all fluid present, and, repeat fluid analysis with Gram stain, cell counts, amylase .lipase.  May eventually need to be transferred to [facility] to undergo ERCP with pancreatic duct stent placement.  Principal Problem:   Sepsis (HCC) Active Problems:   Alcohol dependence syndrome (HCC)   Acute on chronic pancreatitis (HCC)   Pancreatitis   Acute pancreatitis   Other ascites   Protein-calorie malnutrition, severe   Alcoholic liver disease (HCC)   Pancreatic ascites   Anasarca     LOS: 9 days   Miriana Gaertner PA-C 09/05/2022, 5:11 PM

## 2022-09-05 NOTE — Progress Notes (Signed)
PROGRESS NOTE    Samuel Blevins  YNW:[id number] DOB: [birth year] DOA: 08/26/2022 PCP: [name], MD   Brief Narrative:  57 year old with history of recurrent pancreatitis, chronic pancreatic insufficiency, HTN recurrent alcohol use comes to the hospital with complaints of abdominal pain, nausea, vomiting and diarrhea.  Patient was also found to have acute recurrent alcoholic pancreatitis.  Initially patient had left AMA about 4-5 days prior to this admission.  Patient readmitted for acute recurrent alcoholic pancreatitis with worsening ascites, initial labs of ascites fluid indicate elevated amylase concerning for pancreatic duct disruption.  GI continues to follow, hospitalist called for admission.   Assessment & Plan:   Principal Problem:   Sepsis (HCC) Active Problems:   Alcohol dependence syndrome (HCC)   Acute on chronic pancreatitis (HCC)   Pancreatitis   Acute pancreatitis   Other ascites   Protein-calorie malnutrition, severe   Alcoholic liver disease (HCC)   Pancreatic ascites   Anasarca  Decompensated liver cirrhosis with ascites Sepsis secondary to suspected SBP versus infected pancreatic ascites Hypoalbuminemia Hypovolemic hyponatremia -Sepsis evidenced by tachycardia and leukocytosis.  Source presumed pancreatic duct disruption(elevated amylase on paracentesis) and presumed SBP. -Paracentesis 7/1 - 3L; 7/5 - 3L; 7/8 2L, next paracentesis scheduled for Friday per GI note. -Blood culture, paracentesis fluid culture remain negative -Urine culture shows multiple species, likely contaminant Patient completed 8 days of cefepime on 09/02/2022. -Continue octreotide, increased dose per GI -Echo: EF 60-65% with normal LVF and grade 1 diastolic dysfunction -Possible need for ERCP/pancreatic duct stenting per GI given questionable pancreatic duct disruption (amylase in ascites) -Core track placed 7/5 Removed 7/6 as it was nonfunctional/could not be cleared -7/8 cortrak  placed in distal duodenum per GI recs - continue tube feeds(monitor for refeeding syndrome)   Recurrent alcohol induced pancreatitis. Nausea vomiting, resolved -Secondary to chronic alcohol use/abuse -Continue conservative management IV fluids, p.o. intake as tolerated, pain control avoiding IV narcotics, antiemetics, PPI   Severe protein caloric malnutrition High risk for refeeding syndrome -Complicated by chronic alcohol use -Core track placed 7/5 -dysfunctional 7/6 -replaced 09/02/2022, noted to be distal to the proximal duodenum. -Continue to follow electrolytes, mag/Phos.  Repleted as appropriate   Alcohol abuse - Alcohol withdrawal protocol completed, currently outside the window for any further withdrawal symptoms.  No further need for benzos IV versus p.o.    Essential hypertension -Currently well-controlled off medications, as needed hydralazine for hypertensive events   GERD/duodenitis -Continue PPI  DVT prophylaxis: enoxaparin (LOVENOX) injection 40 mg Start: 08/26/22 1700   Code Status: Full Code  Family Communication:  None present at bedside.  Plan of care discussed with patient in length and he/she verbalized understanding and agreed with it.  Status is: Inpatient Remains inpatient appropriate because: Needs ERCP, timing to be determined by GI.   Estimated body mass index is 22.85 kg/m as calculated from the following:   Height as of this encounter: 5\' 9"  (1.753 m).   Weight as of this encounter: 70.2 kg.    Nutritional Assessment: Body mass index is 22.85 kg/m.Marland Kitchen Seen by dietician.  I agree with the assessment and plan as outlined below: Nutrition Status: Nutrition Problem: Severe Malnutrition Etiology: chronic illness (pancreatitis) Signs/Symptoms: severe fat depletion, severe muscle depletion, moderate fat depletion, moderate muscle depletion Interventions: Refer to RD note for recommendations  . Skin Assessment: I have examined the patient's skin and I  agree with the wound assessment as performed by the wound care RN as outlined below:    Consultants:  GI  Procedures:  As above  Antimicrobials:  Anti-infectives (From admission, onward)    Start     Dose/Rate Route Frequency Ordered Stop   08/26/22 1800  ceFEPIme (MAXIPIME) 2 g in sodium chloride 0.9 % 100 mL IVPB        2 g 200 mL/hr over 30 Minutes Intravenous Every 8 hours 08/26/22 1133 09/02/22 1132   08/26/22 1000  ceFEPIme (MAXIPIME) 2 g in sodium chloride 0.9 % 100 mL IVPB        2 g 200 mL/hr over 30 Minutes Intravenous  Once 08/26/22 0953 08/26/22 1051   08/26/22 1000  metroNIDAZOLE (FLAGYL) IVPB 500 mg        500 mg 100 mL/hr over 60 Minutes Intravenous  Once 08/26/22 0953 08/26/22 1213         Subjective: Patient seen and examined.  Complains of abdominal pain, no worse or better than yesterday.  No other complaint.  Objective: Vitals:   09/04/22 2028 09/05/22 0025 09/05/22 0412 09/05/22 0744  BP: 129/89 (!) 139/93 113/86 121/86  Pulse: (!) 103 100 (!) 101 (!) 108  Resp: 18 18 18    Temp: 98.8 F (37.1 C) 98 F (36.7 C) 98.8 F (37.1 C)   TempSrc: Oral Oral Oral   SpO2: 99% 100% 98% 100%  Weight:   70.2 kg   Height:        Intake/Output Summary (Last 24 hours) at 09/05/2022 0944 Last data filed at 09/04/2022 1625 Gross per 24 hour  Intake --  Output 200 ml  Net -200 ml   Filed Weights   09/03/22 0544 09/04/22 0500 09/05/22 0412  Weight: 73 kg 72.5 kg 70.2 kg    Examination:  General exam: Appears calm and comfortable  Respiratory system: Clear to auscultation. Respiratory effort normal. Cardiovascular system: S1 & S2 heard, RRR. No JVD, murmurs, rubs, gallops or clicks. No pedal edema. Gastrointestinal system: Abdomen is moderately distended, firm and generalized tenderness. No organomegaly or masses felt. Normal bowel sounds heard. Central nervous system: Alert and oriented. No focal neurological deficits. Extremities: Symmetric 5 x 5  power. Skin: No rashes, lesions or ulcers.   Data Reviewed: I have personally reviewed following labs and imaging studies  CBC: Recent Labs  Lab 08/31/22 0421 09/01/22 0543 09/02/22 0540 09/03/22 0341 09/04/22 0314  WBC 10.1 8.9 7.6 8.3 9.6  HGB 8.9* 8.7* 8.0* 8.5* 8.7*  HCT 27.5* 26.7* 24.6* 26.2* 26.7*  MCV 86.8 87.8 89.8 89.1 88.4  PLT 705* 847* 789* 892* 869*   Basic Metabolic Panel: Recent Labs  Lab 08/31/22 0421 09/01/22 0543 09/02/22 0540 09/03/22 0341 09/04/22 0314  NA 134* 133* 134* 131* 132*  K 4.5 4.5 4.1 4.3 4.7  CL 104 105 106 103 104  CO2 20* 21* 21* 22 22  GLUCOSE 107* 118* 100* 123* 145*  BUN 16 18 20 17 15   CREATININE 0.90 1.04 1.06 1.06 1.02  CALCIUM 8.4* 8.3* 8.3* 8.1* 8.1*  MG 1.7 1.7 1.5* 1.4* 1.8  PHOS 3.7 3.3 3.7 3.9 3.4   GFR: Estimated Creatinine Clearance: 78.4 mL/min (by C-G formula based on SCr of 1.02 mg/dL). Liver Function Tests: Recent Labs  Lab 08/31/22 0421 09/01/22 0543 09/02/22 0540 09/03/22 0341 09/04/22 0314  AST 30 28 24 26 23   ALT 12 15 15 15 15   ALKPHOS 50 54 48 52 51  BILITOT 1.7* 0.8 0.5 0.5 0.3  PROT 6.0* 6.2* 5.9* 6.4* 6.5  ALBUMIN 2.4* 2.1* 1.9* 1.8* 1.8*   No  results for input(s): "LIPASE", "AMYLASE" in the last 168 hours. No results for input(s): "AMMONIA" in the last 168 hours. Coagulation Profile: No results for input(s): "INR", "PROTIME" in the last 168 hours. Cardiac Enzymes: No results for input(s): "CKTOTAL", "CKMB", "CKMBINDEX", "TROPONINI" in the last 168 hours. BNP (last 3 results) No results for input(s): "PROBNP" in the last 8760 hours. HbA1C: No results for input(s): "HGBA1C" in the last 72 hours. CBG: Recent Labs  Lab 09/04/22 1708 09/04/22 2029 09/05/22 0025 09/05/22 0622 09/05/22 0809  GLUCAP 129* 121* 140* 146* 139*   Lipid Profile: No results for input(s): "CHOL", "HDL", "LDLCALC", "TRIG", "CHOLHDL", "LDLDIRECT" in the last 72 hours. Thyroid Function Tests: No results for  input(s): "TSH", "T4TOTAL", "FREET4", "T3FREE", "THYROIDAB" in the last 72 hours. Anemia Panel: No results for input(s): "VITAMINB12", "FOLATE", "FERRITIN", "TIBC", "IRON", "RETICCTPCT" in the last 72 hours. Sepsis Labs: No results for input(s): "PROCALCITON", "LATICACIDVEN" in the last 168 hours.  Recent Results (from the past 240 hour(s))  Resp panel by RT-PCR (RSV, Flu A&B, Covid) Anterior Nasal Swab     Status: None   Collection Time: 08/26/22  9:52 AM   Specimen: Anterior Nasal Swab  Result Value Ref Range Status   SARS Coronavirus 2 by RT PCR NEGATIVE NEGATIVE Final   Influenza A by PCR NEGATIVE NEGATIVE Final   Influenza B by PCR NEGATIVE NEGATIVE Final    Comment: (NOTE) The Xpert Xpress SARS-CoV-2/FLU/RSV plus assay is intended as an aid in the diagnosis of influenza from Nasopharyngeal swab specimens and should not be used as a sole basis for treatment. Nasal washings and aspirates are unacceptable for Xpert Xpress SARS-CoV-2/FLU/RSV testing.  Fact Sheet for Patients: BloggerCourse.com  Fact Sheet for Healthcare Providers: SeriousBroker.it  This test is not yet approved or cleared by the [place] FDA and has been authorized for detection and/or diagnosis of SARS-CoV-2 by FDA under an Emergency Use Authorization (EUA). This EUA will remain in effect (meaning this test can be used) for the duration of the COVID-19 declaration under Section 564(b)(1) of the Act, 21 U.S.C. section 360bbb-3(b)(1), unless the authorization is terminated or revoked.     Resp Syncytial Virus by PCR NEGATIVE NEGATIVE Final    Comment: (NOTE) Fact Sheet for Patients: BloggerCourse.com  Fact Sheet for Healthcare Providers: SeriousBroker.it  This test is not yet approved or cleared by the [place] FDA and has been authorized for detection and/or diagnosis of SARS-CoV-2 by FDA under  an Emergency Use Authorization (EUA). This EUA will remain in effect (meaning this test can be used) for the duration of the COVID-19 declaration under Section 564(b)(1) of the Act, 21 U.S.C. section 360bbb-3(b)(1), unless the authorization is terminated or revoked.  Performed at [facility], [address], [place], [place] [address]   Gram stain     Status: None   Collection Time: 08/26/22  2:10 PM   Specimen: Abdomen; Peritoneal Fluid  Result Value Ref Range Status   Specimen Description PERITONEAL  Final   Special Requests NONE  Final   Gram Stain   Final    ABUNDANT WBC PRESENT,BOTH PMN AND MONONUCLEAR NO ORGANISMS SEEN Performed at [facility], [address] [place], [place] [address]    Report Status 08/26/2022 FINAL  Final  Culture, body fluid w Gram Stain-bottle     Status: None   Collection Time: 08/26/22  2:10 PM   Specimen: Peritoneal Washings  Result Value Ref Range Status   Specimen Description PERITONEAL  Final   Special Requests NONE  Final   Culture   Final    NO GROWTH 5 DAYS Performed at [facility], [address] [place], [place] [address]    Report Status 08/31/2022 FINAL  Final  Urine Culture (for pregnant, neutropenic or urologic patients or patients with an indwelling urinary catheter)     Status: Abnormal   Collection Time: 08/27/22  5:15 AM   Specimen: Urine, Clean Catch  Result Value Ref Range Status   Specimen Description URINE, CLEAN CATCH  Final   Special Requests   Final    Normal Performed at [facility], [address], [place], [place] [address]    Culture MULTIPLE SPECIES PRESENT, SUGGEST RECOLLECTION (A)  Final   Report Status 08/29/2022 FINAL  Final  Acid Fast Smear (AFB)     Status: None   Collection Time: 08/30/22  9:41 AM   Specimen: Abdomen; Peritoneal Fluid  Result Value Ref Range Status   AFB Specimen Processing Concentration  Final   Acid Fast Smear Negative  Final    Comment:  (NOTE) Performed At: [facility] [address] [place], [place] [id number] Jolene Schimke MD QM:[id number]    Source (AFB) PERITONEAL  Final    Comment: Performed at [facility], [address], [place], [place] [address]  Culture, body fluid w Gram Stain-bottle     Status: None   Collection Time: 08/30/22  9:41 AM   Specimen: Abdomen  Result Value Ref Range Status   Specimen Description ABDOMEN  Final   Special Requests PERITONEAL  Final   Culture   Final    NO GROWTH 5 DAYS Performed at [facility], [address], [place], [place] [address]    Report Status 09/04/2022 FINAL  Final  Gram stain     Status: None   Collection Time: 08/30/22  9:41 AM   Specimen: Abdomen  Result Value Ref Range Status   Specimen Description ABDOMEN  Final   Special Requests PERITONEAL  Final   Gram Stain   Final    WBC PRESENT, PREDOMINANTLY PMN NO ORGANISMS SEEN CYTOSPIN SMEAR Performed at [facility], [address], [place], [place] [address]    Report Status 08/30/2022 FINAL  Final  Gram stain     Status: None   Collection Time: 09/02/22  2:47 PM   Specimen: Abdomen; Peritoneal Fluid  Result Value Ref Range Status   Specimen Description ABDOMEN  Final   Special Requests NONE  Final   Gram Stain   Final    FEW WBC SEEN NO ORGANISMS SEEN Performed at [facility], [address], [place], [place] [address]    Report Status 09/02/2022 FINAL  Final     Radiology Studies: No results found.  Scheduled Meds:  acidophilus  2 capsule Oral TID   enoxaparin (LOVENOX) injection  40 mg Subcutaneous Q24H   feeding supplement  237 mL Oral BID BM   feeding supplement (PROSource TF20)  60 mL Per Tube Daily   folic acid  1 mg Oral Daily   gabapentin  100 mg Oral TID   lidocaine (PF)  10 mL Intradermal Once   lipase/protease/amylase  12,000 Units Oral TID AC   multivitamin with minerals  1 tablet Oral Daily   octreotide  200 mcg Subcutaneous Q12H    pantoprazole  40 mg Oral BID AC   sucralfate  1 g Oral TID PC & HS  thiamine  100 mg Oral Daily   Continuous Infusions:  feeding supplement (VITAL 1.5 CAL) 45 mL/hr at 09/04/22 0543     LOS: 9 days   Hughie Closs, MD [facility]  09/05/2022, 9:44 AM   *Please note that this is a verbal dictation therefore any spelling or grammatical errors are due to the "Dragon Medical One" system interpretation.  Please page via Amion and do not message via secure chat for urgent patient care matters. Secure chat can be used for non urgent patient care matters.  How to contact the [facility] Attending or Consulting provider 7A - 7P or covering provider during after hours 7P -7A, for this patient?  Check the care team in [facility] and look for a) attending/consulting [facility] provider listed and b) the [facility] team listed. Page or secure chat 7A-7P. Log into [email or web address] and use [facility]'s universal password to access. If you do not have the password, please contact the hospital operator. Locate the [facility] provider you are looking for under [facility] and page to a number that you can be directly reached. If you still have difficulty reaching the provider, please page the [facility] (Director on Call) for the Hospitalists listed on amion for assistance.

## 2022-09-05 NOTE — Plan of Care (Signed)

## 2022-09-05 NOTE — Progress Notes (Addendum)
TRH night cross cover note:   I was notified by RN that this patient, who is here with recurrent alcohol induced pancreatitis as well as decompensated liver cirrhosis is experiencing some abdominal discomfort, that is refractory to prn oxycodone.  He is reported to be scheduled for paracentesis tomorrow. Ordered a single dose of IV Dilaudid 0.5 mg IV to further address his abdominal discomfort.  RN notes that in concert with his worsening abdominal discomfort, that his experiencing tachycardia, with other vital signs remaining stable, including afebrile, most recent blood pressure 141/79. Not currently on tele. Patient denies any new shortness of breath or chest pain associated with this. Will obtain EKG at this time, and I have asked patient's RN to please let me know if HR does not improve with improvement in pain control.   Update: EKG suggestive of sinus tach, normal intervals, and no evidence of T wave or ST changes, including no evidence of ST elevation.  Per additional chart review, he has an existing order for prn IV Lopressor for tachycardia. Additionally, will monitor initial heart rate response to pain control intervention, as above.    Update: no significant improvement in abdominal pain with the Dilaudid 0.5 mg IV x 1 dose.  Attempting to be conservative with IV pain medications, also attempting to improve heart rate, which is unchanged, with improvement in pain control.  I have now ordered Dilaudid 1 mg IV x 1 dose now.  For patient's ongoing sinus tachycardia, will resume monitoring on telemetry now.  Have also ordered Lopressor 5 mg IV x 1 dose now, which is in addition to the existing order for prn IV Lopressor for sustained heart rate greater than 110 bpm.     Update: Patient with persistent sinus tachycardia, as above, that has not improved significantly with doses of IV Dilaudid.  Blood pressure appears to be tolerating with most recent systolic blood pressure in the 1 teens.   Continues to deny any chest pain or shortness of breath.  I obtained updated chest x-ray, which showed small left pleural effusion basilar atelectasis, but no evidence of additional acute process, including no evidence of infiltrate, edema, pneumothorax.    Ordered stat labs today include CMP, CBC, serum magnesium level, BNP, urinalysis.  Preliminarily, magnesium level found to be low at 1.6.  Will order magnesium sulfate 2 g IV over 2 hours to attend to this, which may also help with the heart rate.  Per chart review, including most recent progress note, the patient is felt to have hypovolemic hyponatremia, raising the possibility of a tachycardia contribution from compensatory mechanism secondary to dehydration.  In light of this, and given no evidence of edema on chest x-ray, will initiate a small IV fluid bolus, close monitoring of ensuing heart rate response to these IV fluids.    It is noted that he also transiently showed some improvement in heart rate with a dose of IV Lopressor.  Will start with the IV fluids and IV magnesium, and monitor ensuing heart rate response to these interventions.  Continues to be afebrile and will likely benefit from paracentesis is scheduled for the morning.  Additionally, as the patient is currently med-tele status, I will transfer him to progressive unit. In order to achieve transfer to progressive unit, I have placed the following 3 orders:     1) order to transfer patient to progressive unit 2) order to change level of care to progressive 3) a new admission order to admit to progressive  I have communicated the above plan to transfer patient to progressive unit with the patient's RN.   Newton Pigg, DO Hospitalist

## 2022-09-06 ENCOUNTER — Inpatient Hospital Stay (HOSPITAL_COMMUNITY): Payer: MEDICAID

## 2022-09-06 DIAGNOSIS — A0472 Enterocolitis due to Clostridium difficile, not specified as recurrent: Secondary | ICD-10-CM

## 2022-09-06 DIAGNOSIS — R6510 Systemic inflammatory response syndrome (SIRS) of non-infectious origin without acute organ dysfunction: Secondary | ICD-10-CM

## 2022-09-06 DIAGNOSIS — B999 Unspecified infectious disease: Secondary | ICD-10-CM

## 2022-09-06 DIAGNOSIS — K859 Acute pancreatitis without necrosis or infection, unspecified: Secondary | ICD-10-CM | POA: Diagnosis not present

## 2022-09-06 DIAGNOSIS — K8689 Other specified diseases of pancreas: Secondary | ICD-10-CM

## 2022-09-06 DIAGNOSIS — A419 Sepsis, unspecified organism: Secondary | ICD-10-CM | POA: Diagnosis not present

## 2022-09-06 DIAGNOSIS — R6520 Severe sepsis without septic shock: Secondary | ICD-10-CM | POA: Diagnosis not present

## 2022-09-06 DIAGNOSIS — R188 Other ascites: Secondary | ICD-10-CM | POA: Diagnosis not present

## 2022-09-06 HISTORY — PX: IR PARACENTESIS: IMG2679

## 2022-09-06 LAB — CBC
HCT: 30.2 % — ABNORMAL LOW (ref 39.0–52.0)
Hemoglobin: 9.7 g/dL — ABNORMAL LOW (ref 13.0–17.0)
MCH: 28.9 pg (ref 26.0–34.0)
MCHC: 32.1 g/dL (ref 30.0–36.0)
MCV: 89.9 fL (ref 80.0–100.0)
Platelets: 994 10*3/uL (ref 150–400)
RBC: 3.36 MIL/uL — ABNORMAL LOW (ref 4.22–5.81)
RDW: 21.2 % — ABNORMAL HIGH (ref 11.5–15.5)
WBC: 43.8 10*3/uL — ABNORMAL HIGH (ref 4.0–10.5)
nRBC: 0 % (ref 0.0–0.2)

## 2022-09-06 LAB — GLUCOSE, CAPILLARY
Glucose-Capillary: 102 mg/dL — ABNORMAL HIGH (ref 70–99)
Glucose-Capillary: 111 mg/dL — ABNORMAL HIGH (ref 70–99)
Glucose-Capillary: 112 mg/dL — ABNORMAL HIGH (ref 70–99)
Glucose-Capillary: 119 mg/dL — ABNORMAL HIGH (ref 70–99)
Glucose-Capillary: 135 mg/dL — ABNORMAL HIGH (ref 70–99)
Glucose-Capillary: 161 mg/dL — ABNORMAL HIGH (ref 70–99)
Glucose-Capillary: 182 mg/dL — ABNORMAL HIGH (ref 70–99)

## 2022-09-06 LAB — COMPREHENSIVE METABOLIC PANEL
ALT: 17 U/L (ref 0–44)
AST: 25 U/L (ref 15–41)
Albumin: 2 g/dL — ABNORMAL LOW (ref 3.5–5.0)
Alkaline Phosphatase: 71 U/L (ref 38–126)
Anion gap: 15 (ref 5–15)
BUN: 22 mg/dL — ABNORMAL HIGH (ref 6–20)
CO2: 20 mmol/L — ABNORMAL LOW (ref 22–32)
Calcium: 8.5 mg/dL — ABNORMAL LOW (ref 8.9–10.3)
Chloride: 100 mmol/L (ref 98–111)
Creatinine, Ser: 2.19 mg/dL — ABNORMAL HIGH (ref 0.61–1.24)
GFR, Estimated: 34 mL/min — ABNORMAL LOW (ref >60)
Glucose, Bld: 167 mg/dL — ABNORMAL HIGH (ref 70–99)
Potassium: 5.4 mmol/L — ABNORMAL HIGH (ref 3.5–5.1)
Sodium: 135 mmol/L (ref 135–145)
Total Bilirubin: 0.7 mg/dL (ref 0.3–1.2)
Total Protein: 7.6 g/dL (ref 6.5–8.1)

## 2022-09-06 LAB — BODY FLUID CELL COUNT WITH DIFFERENTIAL
Eos, Fluid: 1 %
Lymphs, Fluid: 1 %
Monocyte-Macrophage-Serous Fluid: 2 % — ABNORMAL LOW (ref 50–90)
Neutrophil Count, Fluid: 96 % — ABNORMAL HIGH (ref 0–25)
Total Nucleated Cell Count, Fluid: 16390 cu mm — ABNORMAL HIGH (ref 0–1000)

## 2022-09-06 LAB — AMYLASE, PLEURAL OR PERITONEAL FLUID: Amylase, Fluid: >10000 U/L

## 2022-09-06 LAB — PROTEIN, PLEURAL OR PERITONEAL FLUID: Total protein, fluid: 3.1 g/dL

## 2022-09-06 LAB — CULTURE, BODY FLUID W GRAM STAIN -BOTTLE

## 2022-09-06 LAB — ALBUMIN, PLEURAL OR PERITONEAL FLUID: Albumin, Fluid: <1.5 g/dL

## 2022-09-06 LAB — GRAM STAIN

## 2022-09-06 LAB — MAGNESIUM: Magnesium: 2.3 mg/dL (ref 1.7–2.4)

## 2022-09-06 LAB — PHOSPHORUS: Phosphorus: 3.3 mg/dL (ref 2.5–4.6)

## 2022-09-06 MED ORDER — ALBUMIN HUMAN 25 % IV SOLN
25.0000 g | Freq: Three times a day (TID) | INTRAVENOUS | Status: AC
  Administered 2022-09-06 – 2022-09-07 (×4): 25 g via INTRAVENOUS
  Filled 2022-09-06 (×4): qty 100

## 2022-09-06 MED ORDER — PIPERACILLIN-TAZOBACTAM 3.375 G IVPB
3.3750 g | Freq: Three times a day (TID) | INTRAVENOUS | Status: DC
  Administered 2022-09-06 – 2022-09-08 (×7): 3.375 g via INTRAVENOUS
  Filled 2022-09-06 (×8): qty 50

## 2022-09-06 MED ORDER — LIDOCAINE HCL 1 % IJ SOLN
INTRAMUSCULAR | Status: AC
  Filled 2022-09-06: qty 20

## 2022-09-06 MED ORDER — LIDOCAINE HCL (PF) 1 % IJ SOLN
20.0000 mL | Freq: Once | INTRAMUSCULAR | Status: AC
  Administered 2022-09-06: 10 mL via INTRADERMAL

## 2022-09-06 MED ORDER — VANCOMYCIN 50 MG/ML ORAL SOLUTION
125.0000 mg | Freq: Four times a day (QID) | ORAL | Status: DC
  Administered 2022-09-06 – 2022-09-18 (×46): 125 mg via ORAL
  Filled 2022-09-06 (×49): qty 2.5

## 2022-09-06 NOTE — Progress Notes (Addendum)
PROGRESS NOTE    Samuel Blevins  ZOX:[id number] DOB: 1966-01-24 DOA: 08/26/2022 PCP: [name], MD   Brief Narrative:  57 year old with history of recurrent pancreatitis, chronic pancreatic insufficiency, HTN recurrent alcohol use comes to the hospital with complaints of abdominal pain, nausea, vomiting and diarrhea.  Patient was also found to have acute recurrent alcoholic pancreatitis.  Initially patient had left AMA about 4-5 days prior to this admission.  Patient readmitted for acute recurrent alcoholic pancreatitis with worsening ascites, initial labs of ascites fluid indicate elevated amylase concerning for pancreatic duct disruption.  GI continues to follow, hospitalist called for admission.   Assessment & Plan:   Principal Problem:   Sepsis (HCC) Active Problems:   Alcohol dependence syndrome (HCC)   Alcoholic pancreatitis   Acute on chronic pancreatitis (HCC)   Pancreatitis   Acute pancreatitis   Other ascites   Protein-calorie malnutrition, severe   Alcoholic liver disease (HCC)   Pancreatic ascites   Anasarca  Decompensated liver cirrhosis with ascites Sepsis secondary to suspected SBP versus infected pancreatic ascites Hypoalbuminemia Hypovolemic hyponatremia -Sepsis evidenced by tachycardia and leukocytosis.  Source presumed pancreatic duct disruption(elevated amylase on paracentesis) and presumed SBP. -Paracentesis 7/1 - 3L; 7/5 - 3L; 7/8 2L, next paracentesis scheduled for Friday per GI note. Echo: EF 60-65% with normal LVF and grade 1 diastolic dysfunction -Possible need for ERCP/pancreatic duct stenting per GI given questionable pancreatic duct disruption (amylase in ascites) -Core track placed 7/5 Removed 7/6 as it was nonfunctional/could not be cleared -7/8 cortrak placed in distal duodenum per GI recs - continue tube feeds(monitor for refeeding syndrome) -Blood culture, paracentesis fluid culture remain negative -Urine culture shows multiple  species, likely contaminant He remains on octreotide, dose increased by GI.  Patient completed 8 days of cefepime on 09/02/2022.  However overnight patient's condition got worse, he became tachycardic, increased abdominal pain and tachycardiac and this morning he has significantly elevated leukocytosis of 43,000.  Patient underwent paracentesis with removal of 1.7 L of fluid.  GI suspecting infected pancreatic ascetic and has resumed him on IV Zosyn.  Appreciate GI help.  Acute hypoxic respiratory failure: Overnight on the night of 09/05/2022, patient went into respiratory distress, tachycardia and tachypnea and moved to progressive unit.  Chest x-ray was done which ruled out acute pathology but he did have some atelectasis, likely secondary to enlarging ascites.  Patient feels better and has hemodynamically improved after paracentesis of 1.7 L.  I will order incentive spirometry and I have encouraged patient to use every hour. -  Recurrent alcohol induced pancreatitis. Nausea vomiting, resolved -Secondary to chronic alcohol use/abuse p.o. intake as tolerated, pain control avoiding IV narcotics, antiemetics, PPI   Severe protein caloric malnutrition High risk for refeeding syndrome -Complicated by chronic alcohol use -Core track placed 7/5 -dysfunctional 7/6 -replaced 09/02/2022, noted to be distal to the proximal duodenum. -Continue to follow electrolytes, mag/Phos.  Repleted as appropriate   Alcohol abuse - Alcohol withdrawal protocol completed, currently outside the window for any further withdrawal symptoms.  No further need for benzos IV versus p.o.    Essential hypertension -Currently well-controlled off medications, as needed hydralazine for hypertensive events   GERD/duodenitis -Continue PPI  DVT prophylaxis: enoxaparin (LOVENOX) injection 40 mg Start: 08/26/22 1700   Code Status: Full Code  Family Communication:  None present at bedside.  Plan of care discussed with patient in length  and he/she verbalized understanding and agreed with it.  Status is: Inpatient Remains inpatient appropriate because: Needs  ERCP, timing to be determined by GI.   Estimated body mass index is 22.85 kg/m as calculated from the following:   Height as of this encounter: 5\' 9"  (1.753 m).   Weight as of this encounter: 70.2 kg.    Nutritional Assessment: Body mass index is 22.85 kg/m.Marland Kitchen Seen by dietician.  I agree with the assessment and plan as outlined below: Nutrition Status: Nutrition Problem: Severe Malnutrition Etiology: chronic illness (pancreatitis) Signs/Symptoms: severe fat depletion, severe muscle depletion, moderate fat depletion, moderate muscle depletion Interventions: Refer to RD note for recommendations  . Skin Assessment: I have examined the patient's skin and I agree with the wound assessment as performed by the wound care RN as outlined below:    Consultants:  GI  Procedures:  As above  Antimicrobials:  Anti-infectives (From admission, onward)    Start     Dose/Rate Route Frequency Ordered Stop   09/06/22 1115  piperacillin-tazobactam (ZOSYN) IVPB 3.375 g        3.375 g 12.5 mL/hr over 240 Minutes Intravenous Every 8 hours 09/06/22 1017     08/26/22 1800  ceFEPIme (MAXIPIME) 2 g in sodium chloride 0.9 % 100 mL IVPB        2 g 200 mL/hr over 30 Minutes Intravenous Every 8 hours 08/26/22 1133 09/02/22 1132   08/26/22 1000  ceFEPIme (MAXIPIME) 2 g in sodium chloride 0.9 % 100 mL IVPB        2 g 200 mL/hr over 30 Minutes Intravenous  Once 08/26/22 0953 08/26/22 1051   08/26/22 1000  metroNIDAZOLE (FLAGYL) IVPB 500 mg        500 mg 100 mL/hr over 60 Minutes Intravenous  Once 08/26/22 0953 08/26/22 1213         Subjective: Patient seen and examined after paracentesis.  He said he was feeling better than before.  Still had some abdominal pain.  Objective: Vitals:   09/06/22 0419 09/06/22 0518 09/06/22 0744 09/06/22 1206  BP:  (!) 110/91 127/82 122/72   Pulse:      Resp: (!) 24 (!) 23 (!) 21   Temp:  98 F (36.7 C) 98.1 F (36.7 C) 98.7 F (37.1 C)  TempSrc:  Axillary Oral Oral  SpO2:  97%    Weight:      Height:        Intake/Output Summary (Last 24 hours) at 09/06/2022 1236 Last data filed at 09/06/2022 0245 Gross per 24 hour  Intake 563.25 ml  Output 200 ml  Net 363.25 ml   Filed Weights   09/03/22 0544 09/04/22 0500 09/05/22 0412  Weight: 73 kg 72.5 kg 70.2 kg    Examination:  General exam: Appears calm and comfortable  Respiratory system: Diminished breath sounds at the bases due to poor inspiratory effort Cardiovascular system: S1 & S2 heard, mild tachycardia, no JVD, murmurs, rubs, gallops or clicks. No pedal edema. Gastrointestinal system: Abdomen is firm, mildly distended and with generalized tenderness, no organomegaly or masses felt. Normal bowel sounds heard. Central nervous system: Alert and oriented. No focal neurological deficits. Extremities: Symmetric 5 x 5 power. Skin: No rashes, lesions or ulcers.   Data Reviewed: I have personally reviewed following labs and imaging studies  CBC: Recent Labs  Lab 09/03/22 0341 09/04/22 0314 09/05/22 1026 09/05/22 2217 09/06/22 0308  WBC 8.3 9.6 13.0* 24.2* 43.8*  NEUTROABS  --   --   --  21.2*  --   HGB 8.5* 8.7* 8.5* 11.6* 9.7*  HCT 26.2* 26.7* 26.6*  35.9* 30.2*  MCV 89.1 88.4 86.6 87.3 89.9  PLT 892* 869* 879* 1,107* 994*   Basic Metabolic Panel: Recent Labs  Lab 09/02/22 0540 09/03/22 0341 09/04/22 0314 09/05/22 1026 09/05/22 2217 09/06/22 0308  NA 134* 131* 132* 133* 134* 135  K 4.1 4.3 4.7 4.9 5.4* 5.4*  CL 106 103 104 102 97* 100  CO2 21* 22 22 26  21* 20*  GLUCOSE 100* 123* 145* 138* 216* 167*  BUN 20 17 15 13 17  22*  CREATININE 1.06 1.06 1.02 1.21 1.80* 2.19*  CALCIUM 8.3* 8.1* 8.1* 8.0* 8.6* 8.5*  MG 1.5* 1.4* 1.8 1.6* 1.6* 2.3  PHOS 3.7 3.9 3.4 2.9  --  3.3   GFR: Estimated Creatinine Clearance: 36.5 mL/min (A) (by C-G formula based  on SCr of 2.19 mg/dL (H)). Liver Function Tests: Recent Labs  Lab 09/03/22 0341 09/04/22 0314 09/05/22 1026 09/05/22 2217 09/06/22 0308  AST 26 23 27 30 25   ALT 15 15 15 19 17   ALKPHOS 52 51 65 80 71  BILITOT 0.5 0.3 0.5 0.8 0.7  PROT 6.4* 6.5 7.0 8.4* 7.6  ALBUMIN 1.8* 1.8* 1.9* 2.2* 2.0*   No results for input(s): "LIPASE", "AMYLASE" in the last 168 hours. No results for input(s): "AMMONIA" in the last 168 hours. Coagulation Profile: No results for input(s): "INR", "PROTIME" in the last 168 hours. Cardiac Enzymes: No results for input(s): "CKTOTAL", "CKMB", "CKMBINDEX", "TROPONINI" in the last 168 hours. BNP (last 3 results) No results for input(s): "PROBNP" in the last 8760 hours. HbA1C: No results for input(s): "HGBA1C" in the last 72 hours. CBG: Recent Labs  Lab 09/05/22 1956 09/06/22 0004 09/06/22 0353 09/06/22 0747 09/06/22 1208  GLUCAP 189* 182* 161* 135* 119*   Lipid Profile: No results for input(s): "CHOL", "HDL", "LDLCALC", "TRIG", "CHOLHDL", "LDLDIRECT" in the last 72 hours. Thyroid Function Tests: No results for input(s): "TSH", "T4TOTAL", "FREET4", "T3FREE", "THYROIDAB" in the last 72 hours. Anemia Panel: No results for input(s): "VITAMINB12", "FOLATE", "FERRITIN", "TIBC", "IRON", "RETICCTPCT" in the last 72 hours. Sepsis Labs: No results for input(s): "PROCALCITON", "LATICACIDVEN" in the last 168 hours.  Recent Results (from the past 240 hour(s))  Acid Fast Smear (AFB)     Status: None   Collection Time: 08/30/22  9:41 AM   Specimen: Abdomen; Peritoneal Fluid  Result Value Ref Range Status   AFB Specimen Processing Concentration  Final   Acid Fast Smear Negative  Final    Comment: (NOTE) Performed At: [facility] [address] [place], [place] [id number] Jolene Schimke MD WU:[id number]    Source (AFB) PERITONEAL  Final    Comment: Performed at [facility], [address] [place], [place] [address]  Culture, body fluid w Gram  Stain-bottle     Status: None   Collection Time: 08/30/22  9:41 AM   Specimen: Abdomen  Result Value Ref Range Status   Specimen Description ABDOMEN  Final   Special Requests PERITONEAL  Final   Culture   Final    NO GROWTH 5 DAYS Performed at [facility], [address] [place], [place] [address]    Report Status 09/04/2022 FINAL  Final  Gram stain     Status: None   Collection Time: 08/30/22  9:41 AM   Specimen: Abdomen  Result Value Ref Range Status   Specimen Description ABDOMEN  Final   Special Requests PERITONEAL  Final   Gram Stain   Final    WBC PRESENT, PREDOMINANTLY PMN NO ORGANISMS  SEEN CYTOSPIN SMEAR Performed at [facility], [address], [place], [place] [address]    Report Status 08/30/2022 FINAL  Final  Gram stain     Status: None   Collection Time: 09/02/22  2:47 PM   Specimen: Abdomen; Peritoneal Fluid  Result Value Ref Range Status   Specimen Description ABDOMEN  Final   Special Requests NONE  Final   Gram Stain   Final    FEW WBC SEEN NO ORGANISMS SEEN Performed at [facility], [address], [place], [place] [address]    Report Status 09/02/2022 FINAL  Final     Radiology Studies: IR Paracentesis  Result Date: 09/06/2022 INDICATION: 57 year old male inpatient. History of alcohol abuse with chronic pancreatitis. Admitted for acute pancreatitis. Team is requesting therapeutic and diagnostic paracentesis EXAM: ULTRASOUND GUIDED THERAPEUTIC AND DIAGNOSTIC PARACENTESIS MEDICATIONS: Lidocaine 1% 10 mL COMPLICATIONS: None immediate. PROCEDURE: Informed written consent was obtained from the patient after a discussion of the risks, benefits and alternatives to treatment. A timeout was performed prior to the initiation of the procedure. Initial ultrasound scanning demonstrates a small amount of ascites within the right lower abdominal quadrant. The right lower abdomen was prepped and draped in the usual sterile fashion. 1% lidocaine was  used for local anesthesia. Following this, a 19 gauge, 7-cm, Yueh catheter was introduced. An ultrasound image was saved for documentation purposes. The paracentesis was performed. The catheter was removed and a dressing was applied. The patient tolerated the procedure well without immediate post procedural complication. FINDINGS: A total of approximately 1.7 L of amber fluid was removed. Samples were sent to the laboratory as requested by the clinical team. IMPRESSION: Successful ultrasound-guided therapeutic and diagnostic paracentesis yielding 1.7 liters of peritoneal fluid. Performed by: Anders Grant, NP Electronically Signed   By: Richarda Overlie M.D.   On: 09/06/2022 10:07   DG Chest Port 1 View  Result Date: 09/05/2022 CLINICAL DATA:  Tachycardia EXAM: PORTABLE CHEST 1 VIEW COMPARISON:  Chest x-ray 08/26/2022 FINDINGS: Enteric tube extends below the diaphragm. Cardiomediastinal silhouette is within normal limits. There is minimal bibasilar atelectasis and small left pleural effusion. There is no pneumothorax or acute fracture. IMPRESSION: Minimal bibasilar atelectasis and small left pleural effusion. Electronically Signed   By: Darliss Cheney M.D.   On: 09/05/2022 22:25    Scheduled Meds:  acidophilus  2 capsule Oral TID   enoxaparin (LOVENOX) injection  40 mg Subcutaneous Q24H   feeding supplement  237 mL Oral BID BM   feeding supplement (PROSource TF20)  60 mL Per Tube Daily   folic acid  1 mg Oral Daily   gabapentin  100 mg Oral TID   lipase/protease/amylase  12,000 Units Oral TID AC   multivitamin with minerals  1 tablet Oral Daily   octreotide  200 mcg Subcutaneous Q12H   pantoprazole  40 mg Oral BID AC   sucralfate  1 g Oral TID PC & HS   thiamine  100 mg Oral Daily   Continuous Infusions:  feeding supplement (VITAL 1.5 CAL) Stopped (09/06/22 0500)   piperacillin-tazobactam (ZOSYN)  IV 3.375 g (09/06/22 1137)     LOS: 10 days   Hughie Closs, MD [facility]  09/06/2022, 12:36 PM   *Please note that this is a verbal dictation therefore any spelling or grammatical errors are due to the "Dragon Medical One" system interpretation.  Please page via Amion and do not message via secure chat for urgent patient care matters. Secure chat  can be used for non urgent patient care matters.  How to contact the [facility] Attending or Consulting provider 7A - 7P or covering provider during after hours 7P -7A, for this patient?  Check the care team in [facility] and look for a) attending/consulting [facility] provider listed and b) the [facility] team listed. Page or secure chat 7A-7P. Log into [email or web address] and use [place]'s universal password to access. If you do not have the password, please contact the hospital operator. Locate the [facility] provider you are looking for under [facility] and page to a number that you can be directly reached. If you still have difficulty reaching the provider, please page the [facility] (Director on Call) for the Hospitalists listed on amion for assistance.

## 2022-09-06 NOTE — Procedures (Signed)
Ultrasound-guided diagnostic and therapeutic paracentesis performed yielding 1.7 liters of amber colored fluid.  Fluid was sent to lab for analysis. No immediate complications. EBL is none.

## 2022-09-06 NOTE — Plan of Care (Signed)

## 2022-09-06 NOTE — Progress Notes (Addendum)
Patient ID: Samuel Blevins, male   DOB: 03-10-1965, 57 y.o.   MRN: [id number]    Progress Note   Subjective   Day # 12 CC; chronic pancreatitis, admitted with persistent/recurrent peritonitis since proven to be pancreatic ascites  Octreotide SQ 200 3 times daily Cortrack feeds  Patient developed tachycardia last night and increased abdominal pain Labs last night with WBC 24.2 and now up to 43.8 this a.m. Globin 9.7/994 Potassium 5.4/sodium 135 /BUN 22/creatinine 2.19    Patient says he feels a little bit better this morning, he did have 1 episode of vomiting earlier this morning.  He has been n.p.o. and tube feeds are off Continues to complain of abdominal pain not quite as bad as last night, has also been having diarrhea since last p.m.  Paracentesis has just been done-1.7 L amber fluid removed-all fluid studies are pending   Objective   Vital signs in last 24 hours: Temp:  [98 F (36.7 C)-100 F (37.8 C)] 98.1 F (36.7 C) (07/12 0744) Pulse Rate:  [117-168] 126 (07/12 0349) Resp:  [16-29] 21 (07/12 0744) BP: (86-141)/(61-91) 127/82 (07/12 0744) SpO2:  [89 %-100 %] 97 % (07/12 0518) Last BM Date : 09/06/22 General:    African-American male in NAD Heart: tachy Regular rate and rhythm; no murmurs Lungs: Respirations even and unlabored, lungs CTA bilaterally Abdomen: Full and tender across the upper abdomen softer lower abdomen, bowel sounds are present, some guarding, no rebound Extremities:  Without edema. Neurologic:  Alert and oriented,  grossly normal neurologically. Psych:  Cooperative. Normal mood and affect.  Intake/Output from previous day: 07/11 0701 - 07/12 0700 In: 563.3 [IV Piggyback:563.3] Out: 380 [Urine:380] Intake/Output this shift: No intake/output data recorded.  Lab Results: Recent Labs    09/05/22 1026 09/05/22 2217 09/06/22 0308  WBC 13.0* 24.2* 43.8*  HGB 8.5* 11.6* 9.7*  HCT 26.6* 35.9* 30.2*  PLT 879* 1,107* 994*   BMET Recent Labs     09/05/22 1026 09/05/22 2217 09/06/22 0308  NA 133* 134* 135  K 4.9 5.4* 5.4*  CL 102 97* 100  CO2 26 21* 20*  GLUCOSE 138* 216* 167*  BUN 13 17 22*  CREATININE 1.21 1.80* 2.19*  CALCIUM 8.0* 8.6* 8.5*   LFT Recent Labs    09/06/22 0308  PROT 7.6  ALBUMIN 2.0*  AST 25  ALT 17  ALKPHOS 71  BILITOT 0.7   PT/INR No results for input(s): "LABPROT", "INR" in the last 72 hours.  Studies/Results: DG Chest Port 1 View  Result Date: 09/05/2022 CLINICAL DATA:  Tachycardia EXAM: PORTABLE CHEST 1 VIEW COMPARISON:  Chest x-ray 08/26/2022 FINDINGS: Enteric tube extends below the diaphragm. Cardiomediastinal silhouette is within normal limits. There is minimal bibasilar atelectasis and small left pleural effusion. There is no pneumothorax or acute fracture. IMPRESSION: Minimal bibasilar atelectasis and small left pleural effusion. Electronically Signed   By: Darliss Cheney M.D.   On: 09/05/2022 22:25       Assessment / Plan:    #42 57 year old African-American male with history of EtOH abuse and chronic pancreatitis Admitted 11 days ago with abdominal pain nausea and vomiting B had had a very recent admission and at that time diagnosed with SBP per notes however left AMA and then returned to the hospital with increased pain nausea and vomiting. Since been found to have pancreatic ascites infected, and suspected to be from pancreatic duct disruption. Completed an 8-day course of cefepime Been on octreotide this week for management of persistent  pancreatic ascites. He was scheduled to have repeat paracentesis with repeat fluid analysis cell counts etc. Today  Unfortunately had change in status last night with development of tachycardia, nausea vomiting and increased abdominal pain, now has white count up to 43,000.  Is having diarrhea this morning  Paracentesis has just been done with removal of 1.7 L and fluid analysis is pending  Suspect he has developed recurrent infected  pancreatic ascites also need to consider C. difficile with diarrhea  Plan; ice chips and sips today, will leave cortrack feedings off today Antiemetics/ pain control Have started IV Zosyn Stool for C. Difficile Await paracentesis fluid labs.  Addendum-stool returned positive for C. difficile, oral vancomycin has been started Peritoneal fluid with total nucleated cell count of 16,000 390/96 neutrophils Gram stain with abundant WBCs, no organisms Culture in process Fluid lipase pending/amylase greater than 10,000  Continuing Zosyn despite positive C. difficile until we get culture results back from the peritoneal/pancreatic fluid    Principal Problem:   Sepsis (HCC) Active Problems:   Alcohol dependence syndrome (HCC)   Alcoholic pancreatitis   Acute on chronic pancreatitis (HCC)   Pancreatitis   Acute pancreatitis   Other ascites   Protein-calorie malnutrition, severe   Alcoholic liver disease (HCC)   Pancreatic ascites   Anasarca     LOS: 10 days   William Schake PA-C 09/06/2022, 8:51 AM

## 2022-09-06 NOTE — Progress Notes (Signed)
Date and time results received: 09/06/22 1742 (use smartphrase ".now" to insert current time)  Test: Peritoneal fluid Critical Value: positive for Gram Positive cocci.   Name of Provider Notified: Dr. Jacqulyn Bath  Orders Received? Or Actions Taken?:  Albumin

## 2022-09-06 NOTE — Progress Notes (Addendum)
Pharmacy Antibiotic Note  Samuel Blevins is a 57 y.o. male admitted on 08/26/2022 with pancreatitis.Overnight rapid response for developed tachycardia and tachypnea. Pharmacy has been consulted for Zosyn dosing for abdominal infection/sepsis. CrCL >30.   Plan: Zosyn 3.375g IV q8h (4 hour infusion).  Height: 5\' 9"  (175.3 cm) Weight: 70.2 kg (154 lb 11.2 oz) IBW/kg (Calculated) : 70.7  Temp (24hrs), Avg:98.6 F (37 C), Min:98 F (36.7 C), Max:100 F (37.8 C)  Recent Labs  Lab 09/03/22 0341 09/04/22 0314 09/05/22 1026 09/05/22 2217 09/06/22 0308  WBC 8.3 9.6 13.0* 24.2* 43.8*  CREATININE 1.06 1.02 1.21 1.80* 2.19*    Estimated Creatinine Clearance: 36.5 mL/min (A) (by C-G formula based on SCr of 2.19 mg/dL (H)).    No Known Allergies  Antimicrobials this admission: Cefepime 7/1 >>7/8 Zosyn 7/12 >>   Microbiology results: 7/1 Bcx- ngtd5 7/1 peritoneal fluid cx- ngtd 7/5 peritoneal fluid cx - ngtd 5d   Thank you for allowing pharmacy to be a part of this patient's care.  Teighan Aubert 09/06/2022 9:57 AM

## 2022-09-07 DIAGNOSIS — B999 Unspecified infectious disease: Secondary | ICD-10-CM | POA: Diagnosis not present

## 2022-09-07 DIAGNOSIS — K852 Alcohol induced acute pancreatitis without necrosis or infection: Secondary | ICD-10-CM | POA: Diagnosis not present

## 2022-09-07 DIAGNOSIS — A419 Sepsis, unspecified organism: Secondary | ICD-10-CM | POA: Diagnosis not present

## 2022-09-07 DIAGNOSIS — R188 Other ascites: Secondary | ICD-10-CM | POA: Diagnosis not present

## 2022-09-07 DIAGNOSIS — R6520 Severe sepsis without septic shock: Secondary | ICD-10-CM | POA: Diagnosis not present

## 2022-09-07 DIAGNOSIS — A0472 Enterocolitis due to Clostridium difficile, not specified as recurrent: Secondary | ICD-10-CM | POA: Diagnosis not present

## 2022-09-07 LAB — BASIC METABOLIC PANEL
Anion gap: 9 (ref 5–15)
BUN: 32 mg/dL — ABNORMAL HIGH (ref 6–20)
CO2: 25 mmol/L (ref 22–32)
Calcium: 8.2 mg/dL — ABNORMAL LOW (ref 8.9–10.3)
Chloride: 97 mmol/L — ABNORMAL LOW (ref 98–111)
Creatinine, Ser: 3.04 mg/dL — ABNORMAL HIGH (ref 0.61–1.24)
GFR, Estimated: 23 mL/min — ABNORMAL LOW (ref >60)
Glucose, Bld: 114 mg/dL — ABNORMAL HIGH (ref 70–99)
Potassium: 5.3 mmol/L — ABNORMAL HIGH (ref 3.5–5.1)
Sodium: 131 mmol/L — ABNORMAL LOW (ref 135–145)

## 2022-09-07 LAB — COMPREHENSIVE METABOLIC PANEL
ALT: 15 U/L (ref 0–44)
AST: 29 U/L (ref 15–41)
Albumin: 2 g/dL — ABNORMAL LOW (ref 3.5–5.0)
Alkaline Phosphatase: 64 U/L (ref 38–126)
Anion gap: 8 (ref 5–15)
BUN: 32 mg/dL — ABNORMAL HIGH (ref 6–20)
CO2: 24 mmol/L (ref 22–32)
Calcium: 8.1 mg/dL — ABNORMAL LOW (ref 8.9–10.3)
Chloride: 98 mmol/L (ref 98–111)
Creatinine, Ser: 2.91 mg/dL — ABNORMAL HIGH (ref 0.61–1.24)
GFR, Estimated: 24 mL/min — ABNORMAL LOW (ref >60)
Glucose, Bld: 105 mg/dL — ABNORMAL HIGH (ref 70–99)
Potassium: 5.8 mmol/L — ABNORMAL HIGH (ref 3.5–5.1)
Sodium: 130 mmol/L — ABNORMAL LOW (ref 135–145)
Total Bilirubin: 0.5 mg/dL (ref 0.3–1.2)
Total Protein: 7 g/dL (ref 6.5–8.1)

## 2022-09-07 LAB — GLUCOSE, CAPILLARY
Glucose-Capillary: 100 mg/dL — ABNORMAL HIGH (ref 70–99)
Glucose-Capillary: 116 mg/dL — ABNORMAL HIGH (ref 70–99)
Glucose-Capillary: 112 mg/dL — ABNORMAL HIGH (ref 70–99)
Glucose-Capillary: 109 mg/dL — ABNORMAL HIGH (ref 70–99)
Glucose-Capillary: 122 mg/dL — ABNORMAL HIGH (ref 70–99)

## 2022-09-07 LAB — CBC
HCT: 22.2 % — ABNORMAL LOW (ref 39.0–52.0)
Hemoglobin: 7.5 g/dL — ABNORMAL LOW (ref 13.0–17.0)
MCH: 28.5 pg (ref 26.0–34.0)
MCHC: 33.8 g/dL (ref 30.0–36.0)
MCV: 84.4 fL (ref 80.0–100.0)
Platelets: 739 10*3/uL — ABNORMAL HIGH (ref 150–400)
RBC: 2.63 MIL/uL — ABNORMAL LOW (ref 4.22–5.81)
RDW: 20.9 % — ABNORMAL HIGH (ref 11.5–15.5)
WBC: 31.9 10*3/uL — ABNORMAL HIGH (ref 4.0–10.5)
nRBC: 0 % (ref 0.0–0.2)

## 2022-09-07 LAB — PHOSPHORUS: Phosphorus: 4.2 mg/dL (ref 2.5–4.6)

## 2022-09-07 LAB — MAGNESIUM: Magnesium: 2.3 mg/dL (ref 1.7–2.4)

## 2022-09-07 LAB — CULTURE, BODY FLUID W GRAM STAIN -BOTTLE

## 2022-09-07 MED ORDER — ENOXAPARIN SODIUM 30 MG/0.3ML IJ SOSY
30.0000 mg | PREFILLED_SYRINGE | INTRAMUSCULAR | Status: DC
  Administered 2022-09-07 – 2022-09-10 (×4): 30 mg via SUBCUTANEOUS
  Filled 2022-09-07 (×4): qty 0.3

## 2022-09-07 MED ORDER — SODIUM CHLORIDE 0.9% FLUSH
10.0000 mL | Freq: Two times a day (BID) | INTRAVENOUS | Status: DC
  Administered 2022-09-07 – 2022-09-09 (×4): 10 mL
  Administered 2022-09-09: 20 mL
  Administered 2022-09-10 – 2022-09-17 (×14): 10 mL
  Administered 2022-09-18: 20 mL

## 2022-09-07 MED ORDER — SODIUM CHLORIDE 0.9% FLUSH: 10.0000 mL | INTRAVENOUS | Status: DC | PRN

## 2022-09-07 MED ORDER — SODIUM ZIRCONIUM CYCLOSILICATE 10 G PO PACK
10.0000 g | PACK | Freq: Once | ORAL | Status: AC
  Administered 2022-09-07: 10 g via ORAL
  Filled 2022-09-07 (×2): qty 1

## 2022-09-07 NOTE — Progress Notes (Signed)
Progress Note    ASSESSMENT AND PLAN:   C. difficile colitis on oral vancomycin. WBC count is trending down. GPC ascites (s/o infected pancreatic ascites) EtOH chronic pancreatitis with suspected PD ductal disruption, pancreatic ascites.  Being considered for transfer to [facility] for possible PD stenting  Plan: -Continue tube feeds and ad lib. p.o. -Continue octreotide/vancomycin/Zosyn/IV albumin -Nephrology consult for AKI -Repeat paracentesis - Monday -No overt GI bleeding.  Trend CBC, keep Hb>7 -Would initiate transfer to [facility] early next week once stable     SUBJECTIVE   Feeling better. No further diarrhea Tolerating tube feeds well Also taking p.o. full liquids. Denies having any significant abdominal pain currently.    OBJECTIVE:     Vital signs in last 24 hours: Temp:  [97.9 F (36.6 C)-99.2 F (37.3 C)] 99.2 F (37.3 C) (07/13 0803) Pulse Rate:  [98-100] 98 (07/13 0803) Resp:  [13-16] 14 (07/13 0451) BP: (117-137)/(72-81) 117/81 (07/13 0803) SpO2:  [97 %-98 %] 98 % (07/13 0451) Weight:  [71.2 kg] 71.2 kg (07/13 0451) Last BM Date : 09/06/22 General:   Alert, thin cachectic male.  Core track in place. Abdomen:  Soft, distended, nontender.  Normal bowel sounds,.       Neurologic:  Alert and  oriented x4;  grossly normal neurologically. Psych:  Pleasant, cooperative.  Normal mood and affect.   Intake/Output from previous day: 07/12 0701 - 07/13 0700 In: 2727.1 [P.O.:240; NG/GT:2187.1; IV Piggyback:300] Out: 400 [Urine:400] Intake/Output this shift: No intake/output data recorded.  Lab Results: Recent Labs    09/05/22 2217 09/06/22 0308 09/07/22 0231  WBC 24.2* 43.8* 31.9*  HGB 11.6* 9.7* 7.5*  HCT 35.9* 30.2* 22.2*  PLT 1,107* 994* 739*   BMET Recent Labs    09/05/22 2217 09/06/22 0308 09/07/22 0231  NA 134* 135 130*  K 5.4* 5.4* 5.8*  CL 97* 100 98  CO2 21* 20* 24  GLUCOSE 216* 167* 105*  BUN 17 22* 32*  CREATININE 1.80* 2.19*  2.91*  CALCIUM 8.6* 8.5* 8.1*   LFT Recent Labs    09/07/22 0231  PROT 7.0  ALBUMIN 2.0*  AST 29  ALT 15  ALKPHOS 64  BILITOT 0.5   PT/INR No results for input(s): "LABPROT", "INR" in the last 72 hours. Hepatitis Panel No results for input(s): "HEPBSAG", "HCVAB", "HEPAIGM", "HEPBIGM" in the last 72 hours.  IR Paracentesis  Result Date: 09/06/2022 INDICATION: 57 year old male inpatient. History of alcohol abuse with chronic pancreatitis. Admitted for acute pancreatitis. Team is requesting therapeutic and diagnostic paracentesis EXAM: ULTRASOUND GUIDED THERAPEUTIC AND DIAGNOSTIC PARACENTESIS MEDICATIONS: Lidocaine 1% 10 mL COMPLICATIONS: None immediate. PROCEDURE: Informed written consent was obtained from the patient after a discussion of the risks, benefits and alternatives to treatment. A timeout was performed prior to the initiation of the procedure. Initial ultrasound scanning demonstrates a small amount of ascites within the right lower abdominal quadrant. The right lower abdomen was prepped and draped in the usual sterile fashion. 1% lidocaine was used for local anesthesia. Following this, a 19 gauge, 7-cm, Yueh catheter was introduced. An ultrasound image was saved for documentation purposes. The paracentesis was performed. The catheter was removed and a dressing was applied. The patient tolerated the procedure well without immediate post procedural complication. FINDINGS: A total of approximately 1.7 L of amber fluid was removed. Samples were sent to the laboratory as requested by the clinical team. IMPRESSION: Successful ultrasound-guided therapeutic and diagnostic paracentesis yielding 1.7 liters of peritoneal fluid. Performed by: Anders Grant,  NP Electronically Signed   By: Richarda Overlie M.D.   On: 09/06/2022 10:07   DG Chest Port 1 View  Result Date: 09/05/2022 CLINICAL DATA:  Tachycardia EXAM: PORTABLE CHEST 1 VIEW COMPARISON:  Chest x-ray 08/26/2022 FINDINGS: Enteric tube  extends below the diaphragm. Cardiomediastinal silhouette is within normal limits. There is minimal bibasilar atelectasis and small left pleural effusion. There is no pneumothorax or acute fracture. IMPRESSION: Minimal bibasilar atelectasis and small left pleural effusion. Electronically Signed   By: Darliss Cheney M.D.   On: 09/05/2022 22:25     Principal Problem:   Sepsis (HCC) Active Problems:   Alcohol dependence syndrome (HCC)   Alcoholic pancreatitis   Acute on chronic pancreatitis (HCC)   Pancreatitis   Acute pancreatitis   Other ascites   Protein-calorie malnutrition, severe   Alcoholic liver disease (HCC)   Pancreatic duct disruption   Anasarca   SIRS (systemic inflammatory response syndrome) (HCC)   Infected ascites     LOS: 11 days     Edman Circle, MD 09/07/2022, 10:43 AM [facility] [phone]

## 2022-09-07 NOTE — Plan of Care (Signed)

## 2022-09-07 NOTE — Progress Notes (Addendum)
PROGRESS NOTE    Samuel Blevins  WUJ:[id number] DOB: May 27, 1965 DOA: 08/26/2022 PCP: [name], MD   Brief Narrative:  57 year old with history of recurrent pancreatitis, chronic pancreatic insufficiency, HTN recurrent alcohol use comes to the hospital with complaints of abdominal pain, nausea, vomiting and diarrhea.  Patient was also found to have acute recurrent alcoholic pancreatitis.  Initially patient had left AMA about 4-5 days prior to this admission.  Patient readmitted for acute recurrent alcoholic pancreatitis with worsening ascites, initial labs of ascites fluid indicate elevated amylase concerning for pancreatic duct disruption.  GI continues to follow, hospitalist called for admission.   Assessment & Plan:   Principal Problem:   Sepsis (HCC) Active Problems:   Alcohol dependence syndrome (HCC)   Alcoholic pancreatitis   Acute on chronic pancreatitis (HCC)   Pancreatitis   Acute pancreatitis   Other ascites   Protein-calorie malnutrition, severe   Alcoholic liver disease (HCC)   Pancreatic duct disruption   Anasarca   SIRS (systemic inflammatory response syndrome) (HCC)   Infected ascites  Decompensated liver cirrhosis with ascites Sepsis secondary to suspected SBP versus infected pancreatic ascites Hypoalbuminemia Hypovolemic hyponatremia -Sepsis evidenced by tachycardia and leukocytosis.  Source presumed pancreatic duct disruption(elevated amylase on paracentesis) and presumed SBP. -Paracentesis 7/1 - 3L; 7/5 - 3L; 7/8 2L, next paracentesis scheduled for Friday per GI note. Echo: EF 60-65% with normal LVF and grade 1 diastolic dysfunction -Possible need for ERCP/pancreatic duct stenting per GI given questionable pancreatic duct disruption (amylase in ascites) -Core track placed 7/5 Removed 7/6 as it was nonfunctional/could not be cleared -7/8 cortrak placed in distal duodenum per GI recs - continue tube feeds(monitor for refeeding syndrome) -Blood  culture, paracentesis fluid culture remain negative in the beginning but repeat paracentesis fluid on 09/06/2022 gross gram-positive cocci. -Urine culture shows multiple species, likely contaminant He remains on octreotide, dose increased by GI.  Patient completed 8 days of cefepime on 09/02/2022.  However overnight on 09/05/2022, patient's condition got worse, he became tachycardic, increased abdominal pain and tachycardiac and this morning he has significantly elevated leukocytosis of 43,000.  Patient underwent paracentesis with removal of 1.7 L of fluid.  GI suspecting infected pancreatic ascetic and has resumed him on IV Zosyn.  Eventually fluid is growing gram-positive cocci.  Appreciate GI help.  C. difficile colitis: Patient also started having diarrhea on 09/06/2022, stool was tested positive for C. difficile, started on p.o. vancomycin by GI.  Acute hypoxic respiratory failure: Overnight on the night of 09/05/2022, patient went into respiratory distress, tachycardia and tachypnea and moved to progressive unit.  Chest x-ray was done which ruled out acute pathology but he did have some atelectasis, likely secondary to enlarging ascites.  Patient felt better after paracentesis.  Currently he is stable and comfortable on room air. -  Recurrent alcohol induced pancreatitis. Nausea vomiting, resolved -Secondary to chronic alcohol use/abuse p.o. intake as tolerated, pain control avoiding IV narcotics, antiemetics, PPI   Severe protein caloric malnutrition High risk for refeeding syndrome -Complicated by chronic alcohol use -Core track placed 7/5 -dysfunctional 7/6 -replaced 09/02/2022, noted to be distal to the proximal duodenum. -Continue to follow electrolytes, mag/Phos.  Repleted as appropriate   Alcohol abuse - Alcohol withdrawal protocol completed, currently outside the window for any further withdrawal symptoms.  No further need for benzos IV versus p.o.    Essential hypertension -Currently  well-controlled off medications, as needed hydralazine for hypertensive events   GERD/duodenitis -Continue PPI  Hyperkalemia: Lokelma.  Monitor  on telemetry.  Repeat BMP at 3 PM today.  AKI: Creatinine rising and currently 2.91.  Receiving albumin.  Per GI note, they have consulted nephrology.  Anemia of chronic disease: Hemoglobin dropping gradually, 7.5 today.  Repeating CBC at 3 PM, transfuse if less than 7.  DVT prophylaxis: enoxaparin (LOVENOX) injection 30 mg Start: 09/07/22 1700   Code Status: Full Code  Family Communication:  None present at bedside.  Plan of care discussed with patient in length and he/she verbalized understanding and agreed with it.  Status is: Inpatient Remains inpatient appropriate because: Needs ERCP, timing to be determined by GI.  GI is also planning to transfer him to [facility] early next week.   Estimated body mass index is 23.17 kg/m as calculated from the following:   Height as of this encounter: 5\' 9"  (1.753 m).   Weight as of this encounter: 71.2 kg.    Nutritional Assessment: Body mass index is 23.17 kg/m.Marland Kitchen Seen by dietician.  I agree with the assessment and plan as outlined below: Nutrition Status: Nutrition Problem: Severe Malnutrition Etiology: chronic illness (pancreatitis) Signs/Symptoms: severe fat depletion, severe muscle depletion, moderate fat depletion, moderate muscle depletion Interventions: Refer to RD note for recommendations  . Skin Assessment: I have examined the patient's skin and I agree with the wound assessment as performed by the wound care RN as outlined below:    Consultants:  GI  Procedures:  As above  Antimicrobials:  Anti-infectives (From admission, onward)    Start     Dose/Rate Route Frequency Ordered Stop   09/06/22 1400  vancomycin (VANCOCIN) 50 mg/mL oral solution SOLN 125 mg        125 mg Oral Every 6 hours 09/06/22 1304 09/20/22 1159   09/06/22 1115  piperacillin-tazobactam (ZOSYN) IVPB 3.375 g         3.375 g 12.5 mL/hr over 240 Minutes Intravenous Every 8 hours 09/06/22 1017     08/26/22 1800  ceFEPIme (MAXIPIME) 2 g in sodium chloride 0.9 % 100 mL IVPB        2 g 200 mL/hr over 30 Minutes Intravenous Every 8 hours 08/26/22 1133 09/02/22 1132   08/26/22 1000  ceFEPIme (MAXIPIME) 2 g in sodium chloride 0.9 % 100 mL IVPB        2 g 200 mL/hr over 30 Minutes Intravenous  Once 08/26/22 0953 08/26/22 1051   08/26/22 1000  metroNIDAZOLE (FLAGYL) IVPB 500 mg        500 mg 100 mL/hr over 60 Minutes Intravenous  Once 08/26/22 0953 08/26/22 1213         Subjective: Patient seen and examined this morning.  He states that he is feeling much better today.  Denied any dyspnea, abdominal pain or any other complaint.  Objective: Vitals:   09/06/22 2034 09/06/22 2339 09/07/22 0451 09/07/22 0803  BP: 124/79 131/75 137/80 117/81  Pulse: 100 98 98 98  Resp: 16 13 14    Temp: 98.2 F (36.8 C)  97.9 F (36.6 C) 99.2 F (37.3 C)  TempSrc: Oral  Oral Oral  SpO2: 97% 97% 98%   Weight:   71.2 kg   Height:        Intake/Output Summary (Last 24 hours) at 09/07/2022 1106 Last data filed at 09/07/2022 0551 Gross per 24 hour  Intake 2727.08 ml  Output 400 ml  Net 2327.08 ml   Filed Weights   09/04/22 0500 09/05/22 0412 09/07/22 0451  Weight: 72.5 kg 70.2 kg 71.2 kg  Examination:  General exam: Appears calm and comfortable  Respiratory system: Clear to auscultation. Respiratory effort normal. Cardiovascular system: S1 & S2 heard, RRR. No JVD, murmurs, rubs, gallops or clicks. No pedal edema. Gastrointestinal system: Abdomen is mildly distended, firm with generalized tenderness. No organomegaly or masses felt. Normal bowel sounds heard. Central nervous system: Alert and oriented. No focal neurological deficits. Extremities: Symmetric 5 x 5 power. Skin: No rashes, lesions or ulcers.    Data Reviewed: I have personally reviewed following labs and imaging studies  CBC: Recent Labs   Lab 09/04/22 0314 09/05/22 1026 09/05/22 2217 09/06/22 0308 09/07/22 0231  WBC 9.6 13.0* 24.2* 43.8* 31.9*  NEUTROABS  --   --  21.2*  --   --   HGB 8.7* 8.5* 11.6* 9.7* 7.5*  HCT 26.7* 26.6* 35.9* 30.2* 22.2*  MCV 88.4 86.6 87.3 89.9 84.4  PLT 869* 879* 1,107* 994* 739*   Basic Metabolic Panel: Recent Labs  Lab 09/03/22 0341 09/04/22 0314 09/05/22 1026 09/05/22 2217 09/06/22 0308 09/07/22 0231  NA 131* 132* 133* 134* 135 130*  K 4.3 4.7 4.9 5.4* 5.4* 5.8*  CL 103 104 102 97* 100 98  CO2 22 22 26  21* 20* 24  GLUCOSE 123* 145* 138* 216* 167* 105*  BUN 17 15 13 17  22* 32*  CREATININE 1.06 1.02 1.21 1.80* 2.19* 2.91*  CALCIUM 8.1* 8.1* 8.0* 8.6* 8.5* 8.1*  MG 1.4* 1.8 1.6* 1.6* 2.3 2.3  PHOS 3.9 3.4 2.9  --  3.3 4.2   GFR: Estimated Creatinine Clearance: 27.7 mL/min (A) (by C-G formula based on SCr of 2.91 mg/dL (H)). Liver Function Tests: Recent Labs  Lab 09/04/22 0314 09/05/22 1026 09/05/22 2217 09/06/22 0308 09/07/22 0231  AST 23 27 30 25 29   ALT 15 15 19 17 15   ALKPHOS 51 65 80 71 64  BILITOT 0.3 0.5 0.8 0.7 0.5  PROT 6.5 7.0 8.4* 7.6 7.0  ALBUMIN 1.8* 1.9* 2.2* 2.0* 2.0*   No results for input(s): "LIPASE", "AMYLASE" in the last 168 hours. No results for input(s): "AMMONIA" in the last 168 hours. Coagulation Profile: No results for input(s): "INR", "PROTIME" in the last 168 hours. Cardiac Enzymes: No results for input(s): "CKTOTAL", "CKMB", "CKMBINDEX", "TROPONINI" in the last 168 hours. BNP (last 3 results) No results for input(s): "PROBNP" in the last 8760 hours. HbA1C: No results for input(s): "HGBA1C" in the last 72 hours. CBG: Recent Labs  Lab 09/06/22 1622 09/06/22 2042 09/06/22 2342 09/07/22 0457 09/07/22 0808  GLUCAP 112* 111* 102* 100* 116*   Lipid Profile: No results for input(s): "CHOL", "HDL", "LDLCALC", "TRIG", "CHOLHDL", "LDLDIRECT" in the last 72 hours. Thyroid Function Tests: No results for input(s): "TSH", "T4TOTAL",  "FREET4", "T3FREE", "THYROIDAB" in the last 72 hours. Anemia Panel: No results for input(s): "VITAMINB12", "FOLATE", "FERRITIN", "TIBC", "IRON", "RETICCTPCT" in the last 72 hours. Sepsis Labs: No results for input(s): "PROCALCITON", "LATICACIDVEN" in the last 168 hours.  Recent Results (from the past 240 hour(s))  Acid Fast Smear (AFB)     Status: None   Collection Time: 08/30/22  9:41 AM   Specimen: Abdomen; Peritoneal Fluid  Result Value Ref Range Status   AFB Specimen Processing Concentration  Final   Acid Fast Smear Negative  Final    Comment: (NOTE) Performed At: [facility] [address] [place], [place] [id number] Jolene Schimke MD HQ:[id number]    Source (AFB) PERITONEAL  Final    Comment: Performed at [facility], [address] [place],  [place] [address]  Culture, body fluid w Gram Stain-bottle     Status: None   Collection Time: 08/30/22  9:41 AM   Specimen: Abdomen  Result Value Ref Range Status   Specimen Description ABDOMEN  Final   Special Requests PERITONEAL  Final   Culture   Final    NO GROWTH 5 DAYS Performed at [facility], [address], [place], [place] [address]    Report Status 09/04/2022 FINAL  Final  Gram stain     Status: None   Collection Time: 08/30/22  9:41 AM   Specimen: Abdomen  Result Value Ref Range Status   Specimen Description ABDOMEN  Final   Special Requests PERITONEAL  Final   Gram Stain   Final    WBC PRESENT, PREDOMINANTLY PMN NO ORGANISMS SEEN CYTOSPIN SMEAR Performed at [facility], [address] [place], [place] [address]    Report Status 08/30/2022 FINAL  Final  Gram stain     Status: None   Collection Time: 09/02/22  2:47 PM   Specimen: Abdomen; Peritoneal Fluid  Result Value Ref Range Status   Specimen Description ABDOMEN  Final   Special Requests NONE  Final   Gram Stain   Final    FEW WBC SEEN NO ORGANISMS SEEN Performed at [facility], [address], [place], [place]  [address]    Report Status 09/02/2022 FINAL  Final  Culture, body fluid w Gram Stain-bottle     Status: None (Preliminary result)   Collection Time: 09/06/22  9:18 AM   Specimen: Peritoneal Washings  Result Value Ref Range Status   Specimen Description PERITONEAL  Final   Special Requests NONE  Final   Gram Stain   Final    GRAM POSITIVE COCCI IN CHAINS IN BOTH AEROBIC AND ANAEROBIC BOTTLES CRITICAL RESULT CALLED TO, READ BACK BY AND VERIFIED WITH: RN YOKO IMAI  ON 09/06/22 @ 1738 BY DRT Performed at [facility], [address] [place], [place] [address]    Culture GRAM POSITIVE COCCI IN CHAINS  Final   Report Status PENDING  Incomplete  Gram stain     Status: None   Collection Time: 09/06/22  9:18 AM   Specimen: Peritoneal Washings  Result Value Ref Range Status   Specimen Description PERITONEAL  Final   Special Requests NONE  Final   Gram Stain   Final    ABUNDANT WBC PRESENT, PREDOMINANTLY PMN NO ORGANISMS SEEN Performed at [facility], [address] [place], [place] [address]    Report Status 09/06/2022 FINAL  Final  C Difficile Quick Screen (NO PCR Reflex)     Status: Abnormal   Collection Time: 09/06/22 10:47 AM   Specimen: STOOL  Result Value Ref Range Status   C Diff antigen POSITIVE (A) NEGATIVE Final   C Diff toxin POSITIVE (A) NEGATIVE Final    Comment: CRITICAL RESULT CALLED TO, READ BACK BY AND VERIFIED WITH:  09/06/2022 1047 RN ROOSEVELT    C Diff interpretation Toxin producing C. difficile detected.  Final    Comment: Performed at [facility], [address] [place], [place] [address]     Radiology Studies: IR Paracentesis  Result Date: 09/06/2022 INDICATION: 57 year old male inpatient. History of alcohol abuse with chronic pancreatitis. Admitted for acute pancreatitis. Team is requesting therapeutic and diagnostic paracentesis EXAM: ULTRASOUND GUIDED THERAPEUTIC AND DIAGNOSTIC PARACENTESIS MEDICATIONS: Lidocaine 1% 10 mL COMPLICATIONS:  None immediate. PROCEDURE: Informed written consent  was obtained from the patient after a discussion of the risks, benefits and alternatives to treatment. A timeout was performed prior to the initiation of the procedure. Initial ultrasound scanning demonstrates a small amount of ascites within the right lower abdominal quadrant. The right lower abdomen was prepped and draped in the usual sterile fashion. 1% lidocaine was used for local anesthesia. Following this, a 19 gauge, 7-cm, Yueh catheter was introduced. An ultrasound image was saved for documentation purposes. The paracentesis was performed. The catheter was removed and a dressing was applied. The patient tolerated the procedure well without immediate post procedural complication. FINDINGS: A total of approximately 1.7 L of amber fluid was removed. Samples were sent to the laboratory as requested by the clinical team. IMPRESSION: Successful ultrasound-guided therapeutic and diagnostic paracentesis yielding 1.7 liters of peritoneal fluid. Performed by: Anders Grant, NP Electronically Signed   By: Richarda Overlie M.D.   On: 09/06/2022 10:07   DG Chest Port 1 View  Result Date: 09/05/2022 CLINICAL DATA:  Tachycardia EXAM: PORTABLE CHEST 1 VIEW COMPARISON:  Chest x-ray 08/26/2022 FINDINGS: Enteric tube extends below the diaphragm. Cardiomediastinal silhouette is within normal limits. There is minimal bibasilar atelectasis and small left pleural effusion. There is no pneumothorax or acute fracture. IMPRESSION: Minimal bibasilar atelectasis and small left pleural effusion. Electronically Signed   By: Darliss Cheney M.D.   On: 09/05/2022 22:25    Scheduled Meds:  acidophilus  2 capsule Oral TID   enoxaparin (LOVENOX) injection  30 mg Subcutaneous Q24H   feeding supplement  237 mL Oral BID BM   feeding supplement (PROSource TF20)  60 mL Per Tube Daily   folic acid  1 mg Oral Daily   gabapentin  100 mg Oral TID   lipase/protease/amylase  12,000 Units Oral  TID AC   multivitamin with minerals  1 tablet Oral Daily   octreotide  200 mcg Subcutaneous Q12H   pantoprazole  40 mg Oral BID AC   sodium chloride flush  10-40 mL Intracatheter Q12H   sucralfate  1 g Oral TID PC & HS   thiamine  100 mg Oral Daily   vancomycin  125 mg Oral Q6H   Continuous Infusions:  albumin human 25 g (09/07/22 0401)   feeding supplement (VITAL 1.5 CAL) Stopped (09/06/22 0500)   piperacillin-tazobactam (ZOSYN)  IV 3.375 g (09/07/22 0551)     LOS: 11 days   Hughie Closs, MD [facility]  09/07/2022, 11:06 AM   *Please note that this is a verbal dictation therefore any spelling or grammatical errors are due to the "Dragon Medical One" system interpretation.  Please page via Amion and do not message via secure chat for urgent patient care matters. Secure chat can be used for non urgent patient care matters.  How to contact the [facility] Attending or Consulting provider 7A - 7P or covering provider during after hours 7P -7A, for this patient?  Check the care team in [facility] and look for a) attending/consulting TR[facility] provider listed and b) the [facility] team listed. Page or secure chat 7A-7P. Log into [email or web address] and use [facility]'s universal password to access. If you do not have the password, please contact the hospital operator. Locate the [facility] provider you are looking for under [facility] and page to a number that you can be directly reached. If you still have difficulty reaching the provider, please page the [facility] (Director on Call) for the Hospitalists listed on amion for assistance.

## 2022-09-07 NOTE — Evaluation (Signed)
Occupational Therapy Evaluation Patient Details Name: Samuel Blevins MRN: [id number] DOB: September 26, 1965 Today's Date: 09/07/2022   History of Present Illness 57 y.o. male with medical history significant of alcohol abuse, tobacco abuse, chronic pancreatitis, chronic pain, hx of seizure do, homeless.  Presented with  abd pain.   Clinical Impression   Pt currently at supervision level for simulated toileting with min to mod for LB selfcare secondary to decreased flexibility.  HR increasing into the 130 range with ambulation in the hallway using his rollator.  Pt planning to return to his shelter if possible where he has been staying and using his rollator for support.  Feel he will benefit from acute care OT at this time in order to progress to a modified independent level for discharge.        Recommendations for follow up therapy are one component of a multi-disciplinary discharge planning process, led by the attending physician.  Recommendations may be updated based on patient status, additional functional criteria and insurance authorization.   Assistance Recommended at Discharge PRN  Patient can return home with the following Assistance with cooking/housework;Assist for transportation;Help with stairs or ramp for entrance    Functional Status Assessment  Patient has had a recent decline in their functional status and demonstrates the ability to make significant improvements in function in a reasonable and predictable amount of time.  Equipment Recommendations  None recommended by OT       Precautions / Restrictions Precautions Precautions: Fall Precaution Comments: abd pain, Cortrak Restrictions Weight Bearing Restrictions: No      Mobility Bed Mobility Overal bed mobility: Needs Assistance Bed Mobility: Supine to Sit     Supine to sit: Min assist     General bed mobility comments: Assist to lift trunk to sitting using therapist's hand    Transfers Overall transfer  level: Needs assistance Equipment used: Rollator (4 wheels) Transfers: Sit to/from Stand Sit to Stand: Supervision     Step pivot transfers: Supervision            Balance Overall balance assessment: Needs assistance Sitting-balance support: Feet supported, No upper extremity supported Sitting balance-Leahy Scale: Good     Standing balance support: Bilateral upper extremity supported, During functional activity Standing balance-Leahy Scale: Poor Standing balance comment: Pt needs UE support for balance                           ADL either performed or assessed with clinical judgement   ADL Overall ADL's : Needs assistance/impaired Eating/Feeding: Independent   Grooming: Wash/dry hands;Wash/dry face;Set up Grooming Details (indicate cue type and reason): simulated standing Upper Body Bathing: Set up;Sitting Upper Body Bathing Details (indicate cue type and reason): simulated Lower Body Bathing: Minimal assistance;Sit to/from stand Lower Body Bathing Details (indicate cue type and reason): simulated Upper Body Dressing : Set up;Sitting Upper Body Dressing Details (indicate cue type and reason): simulated Lower Body Dressing: Moderate assistance;Sit to/from stand Lower Body Dressing Details (indicate cue type and reason): simulated Toilet Transfer: Supervision/safety;Ambulation;Rollator (4 wheels) Toilet Transfer Details (indicate cue type and reason): simulated Toileting- Clothing Manipulation and Hygiene: Supervision/safety;Sit to/from stand Toileting - Clothing Manipulation Details (indicate cue type and reason): simulated     Functional mobility during ADLs: Supervision/safety;Rollator (4 wheels) General ADL Comments: Pt with decreased ability to reach his LEs for donning socks, reports asking others to assist him.  May benefit from AE to assist with LB dressing tasks.  HR elevated during  activity up to 130 BPM with ambulation in the hallway.  Low 100s at rest  noted.     Vision Baseline Vision/History: 0 No visual deficits Ability to See in Adequate Light: 0 Adequate Patient Visual Report: No change from baseline       Perception  [facility]   Praxis  [facility]    Pertinent Vitals/Pain Pain Assessment Pain Assessment: Faces Faces Pain Scale: Hurts a little bit Pain Location: abdominal Pain Descriptors / Indicators: Discomfort, Grimacing, Guarding Pain Intervention(s): Limited activity within patient's tolerance     Hand Dominance Right   Extremity/Trunk Assessment Upper Extremity Assessment Upper Extremity Assessment: Overall WFL for tasks assessed (Not formally assessed)   Lower Extremity Assessment Lower Extremity Assessment: Defer to PT evaluation   Cervical / Trunk Assessment Cervical / Trunk Assessment: Kyphotic   Communication Communication Communication: No difficulties   Cognition Arousal/Alertness: Awake/alert Behavior During Therapy: Flat affect Overall Cognitive Status: No family/caregiver present to determine baseline cognitive functioning Area of Impairment: Safety/judgement, Following commands                       Following Commands: Follows one step commands with increased time, Follows one step commands inconsistently Safety/Judgement: Decreased awareness of safety   Problem Solving: Slow processing, Requires verbal cues General Comments: Pt needed min instructional cueing to not pull up on the rollator and to lock the brakes prior to sitting.     General Comments  VSS            Home Living Family/patient expects to be discharged to:: Group home                                 Additional Comments: Was staying a [employer], plans to return at discharge. Has rollator , stays on first level and has elevator access if needing to go upstairs      Prior Functioning/Environment Prior Level of Function : Needs assist             Mobility Comments: ambulatory with rollator at  baseline, but reports recent weakness with falls ADLs Comments: modified independent        OT Problem List: Decreased strength;Decreased activity tolerance;Impaired balance (sitting and/or standing);Decreased safety awareness;Pain;Decreased knowledge of use of DME or AE      OT Treatment/Interventions: Self-care/ADL training;Therapeutic activities;Patient/family education;Balance training;DME and/or AE instruction    OT Goals(Current goals can be found in the care plan section) Acute Rehab OT Goals Patient Stated Goal: Pt wants to get his fluid down and be able to move better. OT Goal Formulation: With patient Time For Goal Achievement: 09/21/22 Potential to Achieve Goals: Fair  OT Frequency: Min 1X/week       AM-PAC OT "6 Clicks" Daily Activity     Outcome Measure Help from another person eating meals?: None Help from another person taking care of personal grooming?: A Little Help from another person toileting, which includes using toliet, bedpan, or urinal?: A Little Help from another person bathing (including washing, rinsing, drying)?: A Little Help from another person to put on and taking off regular upper body clothing?: A Little Help from another person to put on and taking off regular lower body clothing?: A Lot 6 Click Score: 18   End of Session Equipment Utilized During Treatment: Rollator (4 wheels) Nurse Communication: Mobility status  Activity Tolerance: Patient tolerated treatment well Patient left: with call bell/phone within  reach;in bed  OT Visit Diagnosis: Unsteadiness on feet (R26.81);Muscle weakness (generalized) (M62.81);History of falling (Z91.81);Pain Pain - Right/Left:  (abdomen)                Time: 1610-9604 OT Time Calculation (min): 35 min Charges:  OT General Charges $OT Visit: 1 Visit OT Evaluation $OT Eval Moderate Complexity: 1 Mod OT Treatments $Self Care/Home Management : 8-22 mins  Perrin Maltese, OTR/L Acute Rehabilitation Services   Office [phone] 09/07/2022

## 2022-09-07 NOTE — Evaluation (Signed)
Physical Therapy Evaluation Patient Details Name: Samuel Blevins MRN: [id number] DOB: October 11, 1965 Today's Date: 09/07/2022  History of Present Illness  57 y.o. male with medical history significant of alcohol abuse, tobacco abuse, chronic pancreatitis, chronic pain, hx of seizure do, homeless.  Presented with  abd pain.  Clinical Impression  Pt admitted with above diagnosis. Pt was able to ambulate with rollator with min guard assist and overall good safety awareness. Pt should progress and be able to go back to [employer] as he was PTA.   Pt currently with functional limitations due to the deficits listed below (see PT Problem List). Pt will benefit from acute skilled PT to increase their independence and safety with mobility to allow discharge.           Assistance Recommended at Discharge PRN  If plan is discharge home, recommend the following:  Can travel by private vehicle  Assist for transportation   Yes    Equipment Recommendations None recommended by PT  Recommendations for Other Services       Functional Status Assessment Patient has had a recent decline in their functional status and demonstrates the ability to make significant improvements in function in a reasonable and predictable amount of time.     Precautions / Restrictions Precautions Precautions: Fall Precaution Comments: abd pain, Cortrak Restrictions Weight Bearing Restrictions: No      Mobility  Bed Mobility Overal bed mobility: Modified Independent Bed Mobility: Supine to Sit     Supine to sit: HOB elevated, Min guard     General bed mobility comments: up to side of bed without help with guard assist for safety    Transfers Overall transfer level: Needs assistance Equipment used: Rollator (4 wheels) Transfers: Sit to/from Stand Sit to Stand: Min guard           General transfer comment: min guard  for lines only    Ambulation/Gait Ambulation/Gait assistance: Min guard Gait  Distance (Feet): 150 Feet Assistive device: Rollator (4 wheels) Gait Pattern/deviations: Step-through pattern, Drifts right/left Gait velocity: WFL Gait velocity interpretation: <1.31 ft/sec, indicative of household ambulator   General Gait Details: Pt was able to ambulate to hallway.  Pt did show signs of fatigue at 75 feet with pt slowing gait and flexing at trunk and knees slightly.  Pt turned around and able to make it back to bed wtih min guard assist and no LOB with use of his rollator. OVerall fair safety awarneess.  Stairs            Wheelchair Mobility     Tilt Bed    Modified Rankin (Stroke Patients Only)       Balance Overall balance assessment: Needs assistance Sitting-balance support: Feet supported, No upper extremity supported Sitting balance-Leahy Scale: Good     Standing balance support: Bilateral upper extremity supported, During functional activity Standing balance-Leahy Scale: Poor Standing balance comment: relies on rollator for balance and pt states he always had his rollator.                             Pertinent Vitals/Pain Pain Assessment Pain Assessment: Faces Faces Pain Scale: Hurts even more Pain Location: abdominal Pain Descriptors / Indicators: Discomfort, Grimacing, Guarding Pain Intervention(s): Limited activity within patient's tolerance, Monitored during session, Repositioned    Home Living Family/patient expects to be discharged to:: Group home  Additional Comments: Was staying a [employer], plans to return at discharge. Has rollator , stays on first level and has elevator access if needing to go upstairs    Prior Function Prior Level of Function : Needs assist             Mobility Comments: ambulatory with rollator at baseline, but reports recent weakness with falls ADLs Comments: modified independent     Hand Dominance   Dominant Hand: Right    Extremity/Trunk Assessment    Upper Extremity Assessment Upper Extremity Assessment: Defer to OT evaluation    Lower Extremity Assessment Lower Extremity Assessment: Generalized weakness    Cervical / Trunk Assessment Cervical / Trunk Assessment: Kyphotic  Communication   Communication: No difficulties  Cognition Arousal/Alertness: Awake/alert Behavior During Therapy: Flat affect Overall Cognitive Status: No family/caregiver present to determine baseline cognitive functioning Area of Impairment: Safety/judgement, Following commands                       Following Commands: Follows one step commands with increased time, Follows one step commands inconsistently Safety/Judgement: Decreased awareness of safety Awareness: Emergent, Intellectual Problem Solving: Slow processing          General Comments General comments (skin integrity, edema, etc.): VSS    Exercises     Assessment/Plan    PT Assessment Patient needs continued PT services  PT Problem List Decreased strength;Decreased balance;Decreased cognition;Pain;Decreased mobility;Decreased activity tolerance       PT Treatment Interventions Gait training;Functional mobility training;Balance training;Patient/family education;Therapeutic activities;Therapeutic exercise    PT Goals (Current goals can be found in the Care Plan section)  Acute Rehab PT Goals Patient Stated Goal: decrease pain PT Goal Formulation: With patient Time For Goal Achievement: 09/21/22 Potential to Achieve Goals: Good    Frequency Min 1X/week     Co-evaluation               AM-PAC PT "6 Clicks" Mobility  Outcome Measure Help needed turning from your back to your side while in a flat bed without using bedrails?: None Help needed moving from lying on your back to sitting on the side of a flat bed without using bedrails?: None Help needed moving to and from a bed to a chair (including a wheelchair)?: A Little Help needed standing up from a chair using your  arms (e.g., wheelchair or bedside chair)?: A Little Help needed to walk in hospital room?: A Little Help needed climbing 3-5 steps with a railing? : A Little 6 Click Score: 20    End of Session Equipment Utilized During Treatment: Gait belt Activity Tolerance: Patient tolerated treatment well Patient left: in bed;with call bell/phone within reach;with bed alarm set Nurse Communication: Mobility status PT Visit Diagnosis: Difficulty in walking, not elsewhere classified (R26.2);Muscle weakness (generalized) (M62.81)    Time: 2952-8413 PT Time Calculation (min) (ACUTE ONLY): 18 min   Charges:   PT Evaluation $PT Eval Moderate Complexity: 1 Mod   PT General Charges $$ ACUTE PT VISIT: 1 Visit         Milik Gilreath M,PT Acute Rehab Services [phone]   Bevelyn Buckles 09/07/2022, 3:30 PM

## 2022-09-08 ENCOUNTER — Inpatient Hospital Stay (HOSPITAL_COMMUNITY): Payer: MEDICAID

## 2022-09-08 DIAGNOSIS — A0472 Enterocolitis due to Clostridium difficile, not specified as recurrent: Secondary | ICD-10-CM | POA: Diagnosis not present

## 2022-09-08 DIAGNOSIS — K861 Other chronic pancreatitis: Secondary | ICD-10-CM | POA: Diagnosis not present

## 2022-09-08 DIAGNOSIS — B999 Unspecified infectious disease: Secondary | ICD-10-CM | POA: Diagnosis not present

## 2022-09-08 DIAGNOSIS — K659 Peritonitis, unspecified: Secondary | ICD-10-CM | POA: Diagnosis not present

## 2022-09-08 DIAGNOSIS — R6520 Severe sepsis without septic shock: Secondary | ICD-10-CM | POA: Diagnosis not present

## 2022-09-08 DIAGNOSIS — K852 Alcohol induced acute pancreatitis without necrosis or infection: Secondary | ICD-10-CM | POA: Diagnosis not present

## 2022-09-08 DIAGNOSIS — R188 Other ascites: Secondary | ICD-10-CM | POA: Diagnosis not present

## 2022-09-08 DIAGNOSIS — A419 Sepsis, unspecified organism: Secondary | ICD-10-CM | POA: Diagnosis not present

## 2022-09-08 LAB — CREATININE, URINE, RANDOM: Creatinine, Urine: 21 mg/dL

## 2022-09-08 LAB — URINALYSIS, COMPLETE (UACMP) WITH MICROSCOPIC
Bilirubin Urine: NEGATIVE
Glucose, UA: NEGATIVE mg/dL
Hgb urine dipstick: NEGATIVE
Ketones, ur: NEGATIVE mg/dL
Nitrite: NEGATIVE
Protein, ur: 100 mg/dL — AB
Specific Gravity, Urine: 1.016 (ref 1.005–1.030)
pH: 5 (ref 5.0–8.0)

## 2022-09-08 LAB — GLUCOSE, CAPILLARY
Glucose-Capillary: 101 mg/dL — ABNORMAL HIGH (ref 70–99)
Glucose-Capillary: 106 mg/dL — ABNORMAL HIGH (ref 70–99)
Glucose-Capillary: 107 mg/dL — ABNORMAL HIGH (ref 70–99)
Glucose-Capillary: 118 mg/dL — ABNORMAL HIGH (ref 70–99)
Glucose-Capillary: 129 mg/dL — ABNORMAL HIGH (ref 70–99)
Glucose-Capillary: 152 mg/dL — ABNORMAL HIGH (ref 70–99)

## 2022-09-08 LAB — COMPREHENSIVE METABOLIC PANEL
ALT: 14 U/L (ref 0–44)
AST: 25 U/L (ref 15–41)
Albumin: 2.4 g/dL — ABNORMAL LOW (ref 3.5–5.0)
Alkaline Phosphatase: 85 U/L (ref 38–126)
Anion gap: 11 (ref 5–15)
BUN: 31 mg/dL — ABNORMAL HIGH (ref 6–20)
CO2: 25 mmol/L (ref 22–32)
Calcium: 8.4 mg/dL — ABNORMAL LOW (ref 8.9–10.3)
Chloride: 95 mmol/L — ABNORMAL LOW (ref 98–111)
Creatinine, Ser: 3.37 mg/dL — ABNORMAL HIGH (ref 0.61–1.24)
GFR, Estimated: 20 mL/min — ABNORMAL LOW (ref >60)
Glucose, Bld: 98 mg/dL (ref 70–99)
Potassium: 5.4 mmol/L — ABNORMAL HIGH (ref 3.5–5.1)
Sodium: 131 mmol/L — ABNORMAL LOW (ref 135–145)
Total Bilirubin: 0.8 mg/dL (ref 0.3–1.2)
Total Protein: 7.6 g/dL (ref 6.5–8.1)

## 2022-09-08 LAB — CBC
HCT: 22.4 % — ABNORMAL LOW (ref 39.0–52.0)
Hemoglobin: 7.5 g/dL — ABNORMAL LOW (ref 13.0–17.0)
MCH: 28.2 pg (ref 26.0–34.0)
MCHC: 33.5 g/dL (ref 30.0–36.0)
MCV: 84.2 fL (ref 80.0–100.0)
Platelets: 739 10*3/uL — ABNORMAL HIGH (ref 150–400)
RBC: 2.66 MIL/uL — ABNORMAL LOW (ref 4.22–5.81)
RDW: 20.9 % — ABNORMAL HIGH (ref 11.5–15.5)
WBC: 24.8 10*3/uL — ABNORMAL HIGH (ref 4.0–10.5)
nRBC: 0 % (ref 0.0–0.2)

## 2022-09-08 LAB — BASIC METABOLIC PANEL
Anion gap: 16 — ABNORMAL HIGH (ref 5–15)
BUN: 32 mg/dL — ABNORMAL HIGH (ref 6–20)
CO2: 23 mmol/L (ref 22–32)
Calcium: 8.5 mg/dL — ABNORMAL LOW (ref 8.9–10.3)
Chloride: 93 mmol/L — ABNORMAL LOW (ref 98–111)
Creatinine, Ser: 3.35 mg/dL — ABNORMAL HIGH (ref 0.61–1.24)
GFR, Estimated: 20 mL/min — ABNORMAL LOW (ref >60)
Glucose, Bld: 114 mg/dL — ABNORMAL HIGH (ref 70–99)
Potassium: 5 mmol/L (ref 3.5–5.1)
Sodium: 132 mmol/L — ABNORMAL LOW (ref 135–145)

## 2022-09-08 LAB — CK: Total CK: 69 U/L (ref 49–397)

## 2022-09-08 LAB — MAGNESIUM: Magnesium: 2.5 mg/dL — ABNORMAL HIGH (ref 1.7–2.4)

## 2022-09-08 LAB — CULTURE, BODY FLUID W GRAM STAIN -BOTTLE

## 2022-09-08 LAB — SODIUM, URINE, RANDOM: Sodium, Ur: 31 mmol/L

## 2022-09-08 MED ORDER — SODIUM CHLORIDE 0.9 % IV SOLN
8.0000 mg/kg | INTRAVENOUS | Status: DC
  Administered 2022-09-08: 550 mg via INTRAVENOUS
  Filled 2022-09-08: qty 11

## 2022-09-08 MED ORDER — SODIUM ZIRCONIUM CYCLOSILICATE 10 G PO PACK
10.0000 g | PACK | Freq: Once | ORAL | Status: AC
  Administered 2022-09-08: 10 g via ORAL
  Filled 2022-09-08: qty 1

## 2022-09-08 MED ORDER — ALBUMIN HUMAN 25 % IV SOLN
25.0000 g | Freq: Four times a day (QID) | INTRAVENOUS | Status: AC
  Administered 2022-09-08 – 2022-09-09 (×4): 25 g via INTRAVENOUS
  Filled 2022-09-08 (×4): qty 100

## 2022-09-08 MED ORDER — HYDROMORPHONE HCL 1 MG/ML IJ SOLN
0.5000 mg | Freq: Once | INTRAMUSCULAR | Status: AC
  Administered 2022-09-08: 0.5 mg via INTRAVENOUS
  Filled 2022-09-08: qty 1

## 2022-09-08 NOTE — Progress Notes (Addendum)
Patient ID: Samuel Blevins, male   DOB: 1965-12-11, 57 y.o.   MRN: [id number]    Progress Note   Subjective   Day # 14 CC; chronic pancreatitis, admitted with acute abdominal pain, persistent/recurrent peritonitis since proven to be pancreatic ascites  Had completed 8-day course of antibiotics earlier this admission, then had developed progressive abdominal pain and decline in status 2 days ago, repeat paracentesis with evidence for infection, gram-positive cocci and now cultures positive for vancomycin-resistant Enterococcus  Tested positive for Cdiff  on 09/06/2022  Continues on octreotide subcu 200 3 times daily IV Zosyn Vancomycin oral Cortrak in place IV albumin  Patient says he feels a bit worse today increased abdominal pain and some shortness of breath asking for pain medicine, has been on oral oxycodone but says that it is not working. Afebrile ,no vomiting diarrhea has improved  Peritoneal fluid cultures positive for VRE WBC improved 24.8/hemoglobin 7.5/hematocrit 22.4 Sodium 131/potassium 5.4/UN 31/creatinine 3.37     Objective   Vital signs in last 24 hours: Temp:  [98.4 F (36.9 C)-98.9 F (37.2 C)] 98.6 F (37 C) (07/14 0752) Pulse Rate:  [99-108] 104 (07/14 1120) Resp:  [13-20] 20 (07/14 1120) BP: (122-150)/(93-97) 130/94 (07/14 1120) SpO2:  [99 %-100 %] 100 % (07/14 1120) Weight:  [70.6 kg] 70.6 kg (07/14 0410) Last BM Date : 09/06/22 General: Older African-American male in NAD uncomfortable, ill-appearing asking for pain medication Heart:  tachy Regular rate and rhythm; no murmurs Lungs: Respirations even and unlabored, lungs CTA bilaterally Abdomen: Protuberant, full feeling, generalized tenderness some guarding, sounds present Extremities:  Without edema. Neurologic:  Alert and oriented,  grossly normal neurologically. Psych:  Cooperative. Normal mood and affect.  Intake/Output from previous day: 07/13 0701 - 07/14 0700 In: 198.7 [I.V.:10; IV  Piggyback:188.7] Out: 450 [Urine:450] Intake/Output this shift: Total I/O In: 106.8 [P.O.:60; IV Piggyback:46.8] Out: 250 [Urine:250]  Lab Results: Recent Labs    09/06/22 0308 09/07/22 0231 09/08/22 0515  WBC 43.8* 31.9* 24.8*  HGB 9.7* 7.5* 7.5*  HCT 30.2* 22.2* 22.4*  PLT 994* 739* 739*   BMET Recent Labs    09/07/22 0231 09/07/22 1425 09/08/22 0922  NA 130* 131* 131*  K 5.8* 5.3* 5.4*  CL 98 97* 95*  CO2 24 25 25   GLUCOSE 105* 114* 98  BUN 32* 32* 31*  CREATININE 2.91* 3.04* 3.37*  CALCIUM 8.1* 8.2* 8.4*   LFT Recent Labs    09/08/22 0922  PROT 7.6  ALBUMIN 2.4*  AST 25  ALT 14  ALKPHOS 85  BILITOT 0.8   PT/INR No results for input(s): "LABPROT", "INR" in the last 72 hours.       Assessment / Plan:    #68 57 year old male with chronic pancreatitis, now with pancreatic ascites felt likely secondary to pancreatic duct disruption, and secondary peritonitis with fluid cultures positive for VRE/vancomycin-resistant Enterococcus  On Zosyn Also treating with subcu octreotide and albumin  He is hemodynamically stable continuing to have quite a bit of abdominal pain No nausea or vomiting and diarrhea has slowed  #2 C. difficile colitis-on oral vancomycin day 3 #3 progressive acute kidney injury-secondary to sepsis/infection  #4 anemia no overt GI bleeding, continue to monitor #5 nutrition-cortrak in place-continue tube feeds  Plan; plan for repeat paracentesis tomorrow to reassess cell counts  Nephrology consult today  May need ID consult to help with antibiotic for VRE  Ultimately is going to need to transfer to [facility] hopefully after peritonitis is controlled and C.  difficile treated to consider pancreatic duct stent.  Principal Problem:   Sepsis (HCC) Active Problems:   Alcohol dependence syndrome (HCC)   Alcoholic pancreatitis   Acute on chronic pancreatitis (HCC)   Pancreatitis   Acute pancreatitis   Other ascites   Protein-calorie  malnutrition, severe   Alcoholic liver disease (HCC)   Pancreatic duct disruption   Anasarca   SIRS (systemic inflammatory response syndrome) (HCC)   Infected ascites     LOS: 12 days   Amy EsterwoodPA-C  09/08/2022, 12:11 PM     Attending physician's note   I have taken history, reviewed the chart and examined the patient. I performed a substantive portion of this encounter, including complete performance of at least one of the key components, in conjunction with the APP. I agree with the Advanced Practitioner's note, impression and recommendations.   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   C. difficile colitis on Vanc.  No further diarrhea.   Worsening AKI  Plan: -Continue core track feeding. -ID consultation -Appreciate hospitalist service calling in nephrology consultation -Paracentesis tomorrow only if OK with nephrology (have not ordered) -Current plan is to transfer to [facility] for possibly PD stenting.  Hopefully early next week once stable.   Edman Circle, MD [facility] [phone]

## 2022-09-08 NOTE — Plan of Care (Signed)
  Problem: Education: Goal: Knowledge of General Education information will improve Description Including pain rating scale, medication(s)/side effects and non-pharmacologic comfort measures Outcome: Progressing   

## 2022-09-08 NOTE — Plan of Care (Signed)

## 2022-09-08 NOTE — Progress Notes (Signed)
PROGRESS NOTE    Samuel Blevins  ZOX:[id number] DOB: Apr 22, 1965 DOA: 08/26/2022 PCP: [name], MD   Brief Narrative:  57 year old with history of recurrent pancreatitis, chronic pancreatic insufficiency, HTN recurrent alcohol use comes to the hospital with complaints of abdominal pain, nausea, vomiting and diarrhea.  Patient was also found to have acute recurrent alcoholic pancreatitis.  Initially patient had left AMA about 4-5 days prior to this admission.  Patient readmitted for acute recurrent alcoholic pancreatitis with worsening ascites, initial labs of ascites fluid indicate elevated amylase concerning for pancreatic duct disruption.  GI continues to follow, hospitalist called for admission.   Assessment & Plan:   Principal Problem:   Sepsis (HCC) Active Problems:   Alcohol dependence syndrome (HCC)   Alcoholic pancreatitis   Acute on chronic pancreatitis (HCC)   Pancreatitis   Acute pancreatitis   Other ascites   Protein-calorie malnutrition, severe   Alcoholic liver disease (HCC)   Pancreatic duct disruption   Anasarca   SIRS (systemic inflammatory response syndrome) (HCC)   Infected ascites  Decompensated liver cirrhosis with ascites Sepsis secondary to suspected SBP versus infected pancreatic ascites Hypoalbuminemia Hypovolemic hyponatremia -Sepsis evidenced by tachycardia and leukocytosis.  Source presumed pancreatic duct disruption(elevated amylase on paracentesis) and presumed SBP. -Paracentesis 7/1 - 3L; 7/5 - 3L; 7/8 2L, next paracentesis scheduled for Friday per GI note. Echo: EF 60-65% with normal LVF and grade 1 diastolic dysfunction -Possible need for ERCP/pancreatic duct stenting per GI given questionable pancreatic duct disruption (amylase in ascites) -Core track placed 7/5 Removed 7/6 as it was nonfunctional/could not be cleared -7/8 cortrak placed in distal duodenum per GI recs - continue tube feeds(monitor for refeeding syndrome) -Blood  culture, paracentesis fluid culture remain negative in the beginning but repeat paracentesis fluid on 09/06/2022 gross gram-positive cocci. -Urine culture shows multiple species, likely contaminant He remains on octreotide, dose increased by GI.  Patient completed 8 days of cefepime on 09/02/2022.  However overnight on 09/05/2022, patient's condition got worse, he became tachycardic, increased abdominal pain and tachycardiac and this morning he has significantly elevated leukocytosis of 43,000.  Patient underwent paracentesis with removal of 1.7 L of fluid.  GI suspecting infected pancreatic ascetic and has resumed him on IV Zosyn.  Fluid cultures growing VRE.  Will discuss with GI if antibiotics needs to be changed or if ID consultation is warranted.  C. difficile colitis: Patient also started having diarrhea on 09/06/2022, stool was tested positive for C. difficile, started on p.o. vancomycin by GI.  Diarrhea improved.  Acute hypoxic respiratory failure: Overnight on the night of 09/05/2022, patient went into respiratory distress, tachycardia and tachypnea and moved to progressive unit.  Chest x-ray was done which ruled out acute pathology but he did have some atelectasis, likely secondary to enlarging ascites.  Patient felt better after paracentesis.  Currently he is stable and comfortable on room air. -  Recurrent alcohol induced pancreatitis. Nausea vomiting, resolved -Secondary to chronic alcohol use/abuse p.o. intake as tolerated, pain control avoiding IV narcotics, antiemetics, PPI   Severe protein caloric malnutrition High risk for refeeding syndrome -Complicated by chronic alcohol use -Core track placed 7/5 -dysfunctional 7/6 -replaced 09/02/2022, noted to be distal to the proximal duodenum. -Continue to follow electrolytes, mag/Phos.  Repleted as appropriate   Alcohol abuse - Alcohol withdrawal protocol completed, currently outside the window for any further withdrawal symptoms.  No further  need for benzos IV versus p.o.    Essential hypertension -Currently well-controlled off medications, as needed  hydralazine for hypertensive events   GERD/duodenitis -Continue PPI  Hyperkalemia: 5.4 again, repeat dose of Lokelma.  Monitor on telemetry.  Repeat BMP at 3 PM today.  Consulting nephrology.  AKI: Creatinine rising and currently 3.37.  Repeat 4 doses of albumin.  Will consult nephrology.  Anemia of chronic disease: Hemoglobin dropping gradually, 7.1 today.  Repeating CBC at 3 PM, transfuse if less than 7.  DVT prophylaxis: enoxaparin (LOVENOX) injection 30 mg Start: 09/07/22 1700   Code Status: Full Code  Family Communication:  None present at bedside.  Plan of care discussed with patient in length and he/she verbalized understanding and agreed with it.  Status is: Inpatient Remains inpatient appropriate because: Needs ERCP, timing to be determined by GI.  GI is also planning to transfer him to [facility] early next week.   Estimated body mass index is 22.98 kg/m as calculated from the following:   Height as of this encounter: 5\' 9"  (1.753 m).   Weight as of this encounter: 70.6 kg.    Nutritional Assessment: Body mass index is 22.98 kg/m.Marland Kitchen Seen by dietician.  I agree with the assessment and plan as outlined below: Nutrition Status: Nutrition Problem: Severe Malnutrition Etiology: chronic illness (pancreatitis) Signs/Symptoms: severe fat depletion, severe muscle depletion, moderate fat depletion, moderate muscle depletion Interventions: Refer to RD note for recommendations  . Skin Assessment: I have examined the patient's skin and I agree with the wound assessment as performed by the wound care RN as outlined below:    Consultants:  GI  Procedures:  As above  Antimicrobials:  Anti-infectives (From admission, onward)    Start     Dose/Rate Route Frequency Ordered Stop   09/06/22 1400  vancomycin (VANCOCIN) 50 mg/mL oral solution SOLN 125 mg        125 mg Oral  Every 6 hours 09/06/22 1304 09/20/22 1159   09/06/22 1115  piperacillin-tazobactam (ZOSYN) IVPB 3.375 g        3.375 g 12.5 mL/hr over 240 Minutes Intravenous Every 8 hours 09/06/22 1017     08/26/22 1800  ceFEPIme (MAXIPIME) 2 g in sodium chloride 0.9 % 100 mL IVPB        2 g 200 mL/hr over 30 Minutes Intravenous Every 8 hours 08/26/22 1133 09/02/22 1132   08/26/22 1000  ceFEPIme (MAXIPIME) 2 g in sodium chloride 0.9 % 100 mL IVPB        2 g 200 mL/hr over 30 Minutes Intravenous  Once 08/26/22 0953 08/26/22 1051   08/26/22 1000  metroNIDAZOLE (FLAGYL) IVPB 500 mg        500 mg 100 mL/hr over 60 Minutes Intravenous  Once 08/26/22 0953 08/26/22 1213         Subjective: Patient seen and examined.  Doing well and feeling well as well.  No complaints.  Diarrhea is improving.  Objective: Vitals:   09/07/22 2029 09/08/22 0007 09/08/22 0410 09/08/22 0752  BP: (!) 150/95 (!) 131/96 (!) 122/93   Pulse: (!) 108 99 99   Resp: 18 13 14    Temp: 98.9 F (37.2 C)  98.8 F (37.1 C) 98.6 F (37 C)  TempSrc: Oral  Oral Oral  SpO2: 99% 99% 100%   Weight:   70.6 kg   Height:        Intake/Output Summary (Last 24 hours) at 09/08/2022 1113 Last data filed at 09/08/2022 1000 Gross per 24 hour  Intake 305.56 ml  Output 700 ml  Net -394.44 ml   [employer]  09/05/22 0412 09/07/22 0451 09/08/22 0410  Weight: 70.2 kg 71.2 kg 70.6 kg    Examination:  General exam: Appears calm and comfortable  Respiratory system: Clear to auscultation. Respiratory effort normal. Cardiovascular system: S1 & S2 heard, RRR. No JVD, murmurs, rubs, gallops or clicks. No pedal edema. Gastrointestinal system: Abdomen is very mild distention, firm and nontender today. No organomegaly or masses felt. Normal bowel sounds heard. Central nervous system: Alert and oriented. No focal neurological deficits. Extremities: Symmetric 5 x 5 power. Skin: No rashes, lesions or ulcers.  Psychiatry: Judgement and insight  appear normal. Mood & affect appropriate.   Data Reviewed: I have personally reviewed following labs and imaging studies  CBC: Recent Labs  Lab 09/05/22 1026 09/05/22 2217 09/06/22 0308 09/07/22 0231 09/08/22 0515  WBC 13.0* 24.2* 43.8* 31.9* 24.8*  NEUTROABS  --  21.2*  --   --   --   HGB 8.5* 11.6* 9.7* 7.5* 7.5*  HCT 26.6* 35.9* 30.2* 22.2* 22.4*  MCV 86.6 87.3 89.9 84.4 84.2  PLT 879* 1,107* 994* 739* 739*   Basic Metabolic Panel: Recent Labs  Lab 09/03/22 0341 09/04/22 0314 09/05/22 1026 09/05/22 2217 09/06/22 0308 09/07/22 0231 09/07/22 1425 09/08/22 0515 09/08/22 0922  NA 131* 132* 133* 134* 135 130* 131*  --  131*  K 4.3 4.7 4.9 5.4* 5.4* 5.8* 5.3*  --  5.4*  CL 103 104 102 97* 100 98 97*  --  95*  CO2 22 22 26  21* 20* 24 25  --  25  GLUCOSE 123* 145* 138* 216* 167* 105* 114*  --  98  BUN 17 15 13 17  22* 32* 32*  --  31*  CREATININE 1.06 1.02 1.21 1.80* 2.19* 2.91* 3.04*  --  3.37*  CALCIUM 8.1* 8.1* 8.0* 8.6* 8.5* 8.1* 8.2*  --  8.4*  MG 1.4* 1.8 1.6* 1.6* 2.3 2.3  --  2.5*  --   PHOS 3.9 3.4 2.9  --  3.3 4.2  --   --   --    GFR: Estimated Creatinine Clearance: 23.9 mL/min (A) (by C-G formula based on SCr of 3.37 mg/dL (H)). Liver Function Tests: Recent Labs  Lab 09/05/22 1026 09/05/22 2217 09/06/22 0308 09/07/22 0231 09/08/22 0922  AST 27 30 25 29 25   ALT 15 19 17 15 14   ALKPHOS 65 80 71 64 85  BILITOT 0.5 0.8 0.7 0.5 0.8  PROT 7.0 8.4* 7.6 7.0 7.6  ALBUMIN 1.9* 2.2* 2.0* 2.0* 2.4*   No results for input(s): "LIPASE", "AMYLASE" in the last 168 hours. No results for input(s): "AMMONIA" in the last 168 hours. Coagulation Profile: No results for input(s): "INR", "PROTIME" in the last 168 hours. Cardiac Enzymes: No results for input(s): "CKTOTAL", "CKMB", "CKMBINDEX", "TROPONINI" in the last 168 hours. BNP (last 3 results) No results for input(s): "PROBNP" in the last 8760 hours. HbA1C: No results for input(s): "HGBA1C" in the last 72  hours. CBG: Recent Labs  Lab 09/07/22 1624 09/07/22 2038 09/08/22 0009 09/08/22 0409 09/08/22 0808  GLUCAP 109* 122* 107* 106* 118*   Lipid Profile: No results for input(s): "CHOL", "HDL", "LDLCALC", "TRIG", "CHOLHDL", "LDLDIRECT" in the last 72 hours. Thyroid Function Tests: No results for input(s): "TSH", "T4TOTAL", "FREET4", "T3FREE", "THYROIDAB" in the last 72 hours. Anemia Panel: No results for input(s): "VITAMINB12", "FOLATE", "FERRITIN", "TIBC", "IRON", "RETICCTPCT" in the last 72 hours. Sepsis Labs: No results for input(s): "PROCALCITON", "LATICACIDVEN" in the last 168 hours.  Recent Results (from the past 240 hour(s))  Acid Fast Smear (AFB)     Status: None   Collection Time: 08/30/22  9:41 AM   Specimen: Abdomen; Peritoneal Fluid  Result Value Ref Range Status   AFB Specimen Processing Concentration  Final   Acid Fast Smear Negative  Final    Comment: (NOTE) Performed At: [facility] [address] [place], [place] [id number] Jolene Schimke MD NF:[id number]    Source (AFB) PERITONEAL  Final    Comment: Performed at [facility], [address] [place], [place] [address]  Culture, body fluid w Gram Stain-bottle     Status: None   Collection Time: 08/30/22  9:41 AM   Specimen: Abdomen  Result Value Ref Range Status   Specimen Description ABDOMEN  Final   Special Requests PERITONEAL  Final   Culture   Final    NO GROWTH 5 DAYS Performed at [facility], [address], [place], [place] [address]    Report Status 09/04/2022 FINAL  Final  Gram stain     Status: None   Collection Time: 08/30/22  9:41 AM   Specimen: Abdomen  Result Value Ref Range Status   Specimen Description ABDOMEN  Final   Special Requests PERITONEAL  Final   Gram Stain   Final    WBC PRESENT, PREDOMINANTLY PMN NO ORGANISMS SEEN CYTOSPIN SMEAR Performed at [facility], [address], [place], [place] [address]    Report Status 08/30/2022 FINAL  Final  Gram  stain     Status: None   Collection Time: 09/02/22  2:47 PM   Specimen: Abdomen; Peritoneal Fluid  Result Value Ref Range Status   Specimen Description ABDOMEN  Final   Special Requests NONE  Final   Gram Stain   Final    FEW WBC SEEN NO ORGANISMS SEEN Performed at [facility], [address], [place], [place] [address]    Report Status 09/02/2022 FINAL  Final  Culture, body fluid w Gram Stain-bottle     Status: Abnormal   Collection Time: 09/06/22  9:18 AM   Specimen: Peritoneal Washings  Result Value Ref Range Status   Specimen Description PERITONEAL  Final   Special Requests NONE  Final   Gram Stain   Final    GRAM POSITIVE COCCI IN CHAINS IN BOTH AEROBIC AND ANAEROBIC BOTTLES CRITICAL RESULT CALLED TO, READ BACK BY AND VERIFIED WITH: RN YOKO IMAI  ON 09/06/22 @ 1738 BY DRT Performed at [facility], [address] [place], [place] [address]    Culture (A)  Final    ENTEROCOCCUS FAECIUM VANCOMYCIN RESISTANT ENTEROCOCCUS    Report Status 09/08/2022 FINAL  Final   Organism ID, Bacteria ENTEROCOCCUS FAECIUM  Final      Susceptibility   Enterococcus faecium - MIC*    AMPICILLIN >=32 RESISTANT Resistant     VANCOMYCIN >=32 RESISTANT Resistant     GENTAMICIN SYNERGY SENSITIVE Sensitive     * ENTEROCOCCUS FAECIUM  Gram stain     Status: None   Collection Time: 09/06/22  9:18 AM   Specimen: Peritoneal Washings  Result Value Ref Range Status   Specimen Description PERITONEAL  Final   Special Requests NONE  Final   Gram Stain   Final    ABUNDANT WBC PRESENT, PREDOMINANTLY PMN NO ORGANISMS SEEN Performed at [facility], [address], [place], [place] [address]    Report Status 09/06/2022 FINAL  Final  C Difficile Quick Screen (NO PCR Reflex)  Status: Abnormal   Collection Time: 09/06/22 10:47 AM   Specimen: STOOL  Result Value Ref Range Status   C Diff antigen POSITIVE (A) NEGATIVE Final   C Diff toxin POSITIVE (A) NEGATIVE Final    Comment:  CRITICAL RESULT CALLED TO, READ BACK BY AND VERIFIED WITH:  09/06/2022 1047 RN ROOSEVELT    C Diff interpretation Toxin producing C. difficile detected.  Final    Comment: Performed at [facility], [address], [place], [place] [address]     Radiology Studies: No results found.  Scheduled Meds:  acidophilus  2 capsule Oral TID   enoxaparin (LOVENOX) injection  30 mg Subcutaneous Q24H   feeding supplement  237 mL Oral BID BM   feeding supplement (PROSource TF20)  60 mL Per Tube Daily   folic acid  1 mg Oral Daily   gabapentin  100 mg Oral TID   lipase/protease/amylase  12,000 Units Oral TID AC   multivitamin with minerals  1 tablet Oral Daily   octreotide  200 mcg Subcutaneous Q12H   pantoprazole  40 mg Oral BID AC   sodium chloride flush  10-40 mL Intracatheter Q12H   sucralfate  1 g Oral TID PC & HS   thiamine  100 mg Oral Daily   vancomycin  125 mg Oral Q6H   Continuous Infusions:  feeding supplement (VITAL 1.5 CAL) Stopped (09/06/22 0500)   piperacillin-tazobactam (ZOSYN)  IV Stopped (09/08/22 0915)     LOS: 12 days   Hughie Closs, MD [facility]  09/08/2022, 11:13 AM   *Please note that this is a verbal dictation therefore any spelling or grammatical errors are due to the "[employer]agon [employer] One" system interpretation.  Please page via Amion and do not message via secure chat for urgent patient care matters. Secure chat can be used for non urgent patient care matters.  How to contact the [facility] Attending or Consulting provider 7A - 7P or covering provider during after hours 7P -7A, for this patient?  Check the care team in [facility] and look for a) attending/consulting TR[facility] provider listed and b) the [facility] team listed. Page or secure chat 7A-7P. Log into [email or web address] and use [facility]'s universal password to access. If you do not have the password, please contact the hospital operator. Locate the [facility] provider you are looking for under [facility] and page to  a number that you can be directly reached. If you still have difficulty reaching the provider, please page the [facility] (Director on Call) for the Hospitalists listed on amion for assistance.

## 2022-09-08 NOTE — Progress Notes (Signed)
TRH night cross cover note:   I was notified by RN that the patient has an existing order for tube feeds via the core track, but notes that it appears that the tube feeds actually been held since approximately 5 AM on 09/06/2022.  I asked patient's RN to please continue to hold these tube feeds, awaiting clarification from day rounding hospitalist in the morning.    Newton Pigg, DO Hospitalist

## 2022-09-08 NOTE — Consult Note (Signed)
[facility]  Total days of antibiotics 14 Reason for Consult: VRE peritonitis and cdiff   Referring Physician: pahwani  Principal Problem:   Sepsis (HCC) Active Problems:   Alcohol dependence syndrome (HCC)   Alcoholic pancreatitis   Acute on chronic pancreatitis (HCC)   Pancreatitis   Acute pancreatitis   Other ascites   Protein-calorie malnutrition, severe   Alcoholic liver disease (HCC)   Pancreatic duct disruption   Anasarca   SIRS (systemic inflammatory response syndrome) (HCC)   Infected ascites    HPI: Samuel Blevins is a 57 y.o. male with hx of recurrent pancretitis in setting of ETOH use, c/b chronic pancreatic insufficiency. Admitted initially for alcoholic pancreatitis from 6/22-6/27 , then left AMA, readmitted on 7/1 for worsening ascites and pancreatitis. He underwent paracentesis -analysis showed GPC as well as amylase, c/w pancreatic duct disruption. He was originally placed on cefepime and then changed to piptazo awaiting ascites cx which have now been identifiedas VRE. In addition to bacterial peritonitis, he also decompensated with worsening diarrhea, abdominal pain, leukocytosis up to 43.8K. Cdiff testing + ag + toxin  on 7/12 for which he was started on oral vancomycin. Since starting oral vancomycin, wbc trended down to 24.8K. GI team has been following patient had [facility] pancreatic duct stent. ID asked to weigh in on abtx management.  Patient still having some abdominal discomfort mostly RLQ, had some relief with dilaudid per RN. Starting to want to eat.  Past Medical History:  Diagnosis Date   Alcohol withdrawal syndrome with complication (HCC)    Alcoholism (HCC)    Elevated AST (SGOT)    Gastrointestinal hemorrhage    Homeless    Hypertension    Pancreatic insufficiency    takes Creon   Symptomatic anemia 11/23/2015   Thrombocytopenia (HCC) 05/09/2021    Allergies: No  Known Allergies   MEDICATIONS:  acidophilus  2 capsule Oral TID   enoxaparin (LOVENOX) injection  30 mg Subcutaneous Q24H   feeding supplement  237 mL Oral BID BM   feeding supplement (PROSource TF20)  60 mL Per Tube Daily   folic acid  1 mg Oral Daily   gabapentin  100 mg Oral TID   lipase/protease/amylase  12,000 Units Oral TID AC   multivitamin with minerals  1 tablet Oral Daily   octreotide  200 mcg Subcutaneous Q12H   pantoprazole  40 mg Oral BID AC   sodium chloride flush  10-40 mL Intracatheter Q12H   sucralfate  1 g Oral TID PC & HS   thiamine  100 mg Oral Daily   vancomycin  125 mg Oral Q6H    Social History   Tobacco Use   Smoking status: Every Day    Current packs/day: 0.50    Average packs/day: 0.5 packs/day for 30.0 years (15.0 ttl pk-yrs)    Types: Cigarettes   Smokeless tobacco: Never  Vaping Use   Vaping status: Never Used  Substance Use Topics   Alcohol use: Yes    Alcohol/week: 28.0 standard drinks of alcohol    Types: 28 Shots of liquor per week   Drug use: Never    Family History  Problem Relation Age of Onset   Diabetes Mellitus II Neg Hx    Colon cancer Neg Hx    Stomach cancer Neg Hx    Pancreatic cancer Neg Hx     Review of Systems - abd pain + and diarrhea improved.  12 point ros is otherwise negative   OBJECTIVE: Temp:  [98.4 F (36.9 C)-98.9 F (37.2 C)] 98.6 F (37 C) (07/14 0752) Pulse Rate:  [99-108] 104 (07/14 1120) Resp:  [13-20] 20 (07/14 1120) BP: (122-150)/(93-97) 130/94 (07/14 1120) SpO2:  [99 %-100 %] 100 % (07/14 1120) Weight:  [70.6 kg] 70.6 kg (07/14 0410) Physical Exam  Constitutional: He is oriented to person, place, and time. He appears chronically ill and undernourished. Emaciated. No distress.  HENT:  Mouth/Throat: Oropharynx is clear and moist. No oropharyngeal exudate.  Cardiovascular: Normal rate, regular rhythm and normal heart sounds. Exam reveals no gallop and no friction rub.  No murmur heard.   Pulmonary/Chest: Effort normal and breath sounds normal. No respiratory distress. He has no wheezes.  Abdominal: Soft. Bowel sounds are normal. + distension. There is no tenderness.  Lymphadenopathy:  He has no cervical adenopathy.  ZOX:WRUE arm picc line Neurological: He is alert and oriented to person, place, and time.  Skin: Skin is warm and dry. No rash noted. No erythema.  Psychiatric: He has a normal mood and affect. His behavior is normal.    LABS: Results for orders placed or performed during the hospital encounter of 08/26/22 (from the past 48 hour(s))  Glucose, capillary     Status: Abnormal   Collection Time: 09/06/22  4:22 PM  Result Value Ref Range   Glucose-Capillary 112 (H) 70 - 99 mg/dL    Comment: Glucose reference range applies only to samples taken after fasting for at least 8 hours.  Glucose, capillary     Status: Abnormal   Collection Time: 09/06/22  8:42 PM  Result Value Ref Range   Glucose-Capillary 111 (H) 70 - 99 mg/dL    Comment: Glucose reference range applies only to samples taken after fasting for at least 8 hours.  Glucose, capillary     Status: Abnormal   Collection Time: 09/06/22 11:42 PM  Result Value Ref Range   Glucose-Capillary 102 (H) 70 - 99 mg/dL    Comment: Glucose reference range applies only to samples taken after fasting for at least 8 hours.  Magnesium     Status: None   Collection Time: 09/07/22  2:31 AM  Result Value Ref Range   Magnesium 2.3 1.7 - 2.4 mg/dL    Comment: Performed at [facility], [address] [place], [place] [address]  Phosphorus     Status: None   Collection Time: 09/07/22  2:31 AM  Result Value Ref Range   Phosphorus 4.2 2.5 - 4.6 mg/dL    Comment: Performed at [facility], [address] [place], [place] [address]  Comprehensive metabolic panel     Status: Abnormal   Collection Time: 09/07/22  2:31 AM  Result Value Ref Range   Sodium 130 (L) 135 - 145 mmol/L   Potassium 5.8 (H) 3.5 - 5.1  mmol/L   Chloride 98 98 - 111 mmol/L   CO2 24 22 - 32 mmol/L   Glucose, Bld 105 (H) 70 - 99 mg/dL    Comment: Glucose reference range applies only to samples taken after fasting for at least 8 hours.   BUN 32 (H) 6 - 20 mg/dL   Creatinine, Ser 4.78 (H) 0.61 - 1.24 mg/dL   Calcium 8.1 (L) 8.9 - 10.3 mg/dL   Total Protein 7.0 6.5 - 8.1 g/dL   Albumin 2.0 (L) 3.5 - 5.0 g/dL   AST 29 15 - 41 U/L   ALT 15 0 -  44 U/L   Alkaline Phosphatase 64 38 - 126 U/L   Total Bilirubin 0.5 0.3 - 1.2 mg/dL   GFR, Estimated 24 (L) >60 mL/min    Comment: (NOTE) Calculated using the CKD-EPI Creatinine Equation (2021)    Anion gap 8 5 - 15    Comment: Performed at [facility], [address] [place], [place] [address]  CBC     Status: Abnormal   Collection Time: 09/07/22  2:31 AM  Result Value Ref Range   WBC 31.9 (H) 4.0 - 10.5 K/uL   RBC 2.63 (L) 4.22 - 5.81 MIL/uL   Hemoglobin 7.5 (L) 13.0 - 17.0 g/dL   HCT 46.9 (L) 62.9 - 52.8 %   MCV 84.4 80.0 - 100.0 fL   MCH 28.5 26.0 - 34.0 pg   MCHC 33.8 30.0 - 36.0 g/dL   RDW 41.3 (H) 24.4 - 01.0 %   Platelets 739 (H) 150 - 400 K/uL   nRBC 0.0 0.0 - 0.2 %    Comment: Performed at [facility], [address] [place], [place] [address]  Glucose, capillary     Status: Abnormal   Collection Time: 09/07/22  4:57 AM  Result Value Ref Range   Glucose-Capillary 100 (H) 70 - 99 mg/dL    Comment: Glucose reference range applies only to samples taken after fasting for at least 8 hours.  Glucose, capillary     Status: Abnormal   Collection Time: 09/07/22  8:08 AM  Result Value Ref Range   Glucose-Capillary 116 (H) 70 - 99 mg/dL    Comment: Glucose reference range applies only to samples taken after fasting for at least 8 hours.  Glucose, capillary     Status: Abnormal   Collection Time: 09/07/22 11:43 AM  Result Value Ref Range   Glucose-Capillary 112 (H) 70 - 99 mg/dL    Comment: Glucose reference range applies only to samples taken after  fasting for at least 8 hours.  Basic metabolic panel     Status: Abnormal   Collection Time: 09/07/22  2:25 PM  Result Value Ref Range   Sodium 131 (L) 135 - 145 mmol/L   Potassium 5.3 (H) 3.5 - 5.1 mmol/L   Chloride 97 (L) 98 - 111 mmol/L   CO2 25 22 - 32 mmol/L   Glucose, Bld 114 (H) 70 - 99 mg/dL    Comment: Glucose reference range applies only to samples taken after fasting for at least 8 hours.   BUN 32 (H) 6 - 20 mg/dL   Creatinine, Ser 6.64 (H) 0.61 - 1.24 mg/dL   Calcium 8.2 (L) 8.9 - 10.3 mg/dL   GFR, Estimated 23 (L) >60 mL/min    Comment: (NOTE) Calculated using the CKD-EPI Creatinine Equation (2021)    Anion gap 9 5 - 15    Comment: Performed at [facility], [address] [place] [address], [place], [place] [address]  Glucose, capillary     Status: Abnormal   Collection Time: 09/07/22  4:24 PM  Result Value Ref Range   Glucose-Capillary 109 (H) 70 - 99 mg/dL    Comment: Glucose reference range applies only to samples taken after fasting for at least 8 hours.  Glucose, capillary     Status: Abnormal   Collection Time: 09/07/22  8:38 PM  Result Value Ref Range   Glucose-Capillary 122 (H) 70 - 99 mg/dL    Comment: Glucose reference range applies only to samples taken after fasting for at least 8  hours.  Glucose, capillary     Status: Abnormal   Collection Time: 09/08/22 12:09 AM  Result Value Ref Range   Glucose-Capillary 107 (H) 70 - 99 mg/dL    Comment: Glucose reference range applies only to samples taken after fasting for at least 8 hours.  Glucose, capillary     Status: Abnormal   Collection Time: 09/08/22  4:09 AM  Result Value Ref Range   Glucose-Capillary 106 (H) 70 - 99 mg/dL    Comment: Glucose reference range applies only to samples taken after fasting for at least 8 hours.  Magnesium     Status: Abnormal   Collection Time: 09/08/22  5:15 AM  Result Value Ref Range   Magnesium 2.5 (H) 1.7 - 2.4 mg/dL    Comment: Performed at [facility], [address], [place], [place] [address]  CBC     Status: Abnormal   Collection Time: 09/08/22  5:15 AM  Result Value Ref Range   WBC 24.8 (H) 4.0 - 10.5 K/uL   RBC 2.66 (L) 4.22 - 5.81 MIL/uL   Hemoglobin 7.5 (L) 13.0 - 17.0 g/dL   HCT 21.3 (L) 08.6 - 57.8 %   MCV 84.2 80.0 - 100.0 fL   MCH 28.2 26.0 - 34.0 pg   MCHC 33.5 30.0 - 36.0 g/dL   RDW 46.9 (H) 62.9 - 52.8 %   Platelets 739 (H) 150 - 400 K/uL   nRBC 0.0 0.0 - 0.2 %    Comment: Performed at [facility], [address] [place], [place] [address]  Glucose, capillary     Status: Abnormal   Collection Time: 09/08/22  8:08 AM  Result Value Ref Range   Glucose-Capillary 118 (H) 70 - 99 mg/dL    Comment: Glucose reference range applies only to samples taken after fasting for at least 8 hours.  Comprehensive metabolic panel     Status: Abnormal   Collection Time: 09/08/22  9:22 AM  Result Value Ref Range   Sodium 131 (L) 135 - 145 mmol/L   Potassium 5.4 (H) 3.5 - 5.1 mmol/L   Chloride 95 (L) 98 - 111 mmol/L   CO2 25 22 - 32 mmol/L   Glucose, Bld 98 70 - 99 mg/dL    Comment: Glucose reference range applies only to samples taken after fasting for at least 8 hours.   BUN 31 (H) 6 - 20 mg/dL   Creatinine, Ser 4.01 (H) 0.61 - 1.24 mg/dL   Calcium 8.4 (L) 8.9 - 10.3 mg/dL   Total Protein 7.6 6.5 - 8.1 g/dL   Albumin 2.4 (L) 3.5 - 5.0 g/dL   AST 25 15 - 41 U/L   ALT 14 0 - 44 U/L   Alkaline Phosphatase 85 38 - 126 U/L   Total Bilirubin 0.8 0.3 - 1.2 mg/dL   GFR, Estimated 20 (L) >60 mL/min    Comment: (NOTE) Calculated using the CKD-EPI Creatinine Equation (2021)    Anion gap 11 5 - 15    Comment: Performed at [facility], [address] [place], [place] [address]  Glucose, capillary     Status: Abnormal   Collection Time: 09/08/22 12:04 PM  Result Value Ref Range   Glucose-Capillary 152 (H) 70 - 99 mg/dL    Comment: Glucose reference range applies only to samples taken after fasting for at least 8 hours.     MICRO: Susceptibility   Enterococcus faecium    MIC  AMPICILLIN >=32 RESIST... Resistant    GENTAMICIN SYNERGY SENSITIVE Sensitive    VANCOMYCIN >=32 RESIST... Resistant           Susceptibility Comments      IMAGING: No results found.  Assessment/Plan:  57yo M with hx of recurrent ETOH pancreatitis and pancreatic insufficiency admitted for acute on chronic pancreatic flare as well as worsening ascites - found to have +bacterial peritonitis with +amylase concerning for PD leak. Cultures growing VRE. In addition, during his hospitalization, he developed cdifficile colitis, at risk due to broad spectrum abtx.   VRE peritonitis = will d/c piptazo and change to daptomycin 69m/kg/day.will check ck. Plan for 3 wk.  Cdifficile colitis = appears improving after 3 days of treatment with oral vancomycin. Continue with treatment at 10 days.   Chronic pancreatitis = currently slowly increase his intake.   Pancreatic duct leak = defer to GI for management/stent placement.

## 2022-09-08 NOTE — Progress Notes (Signed)
Pharmacy Antibiotic Note  Samuel Blevins is a 57 y.o. male admitted on 08/26/2022 with pancreatitis. Pharmacy has been consulted for daptomycin dosing for VRE peritonitis; previously on Zosyn. Also with C diff on oral vancomycin.  He is afebrile, WBC down to 24.8, and SCr worsening with SCr up to 3.04 (baseline 0.9).  Plan: Daptomycin 550 mg IV q48h (8 mg/kg) Monitor renal function, clinical progress, cultures/sensitivities F/U LOT   Height: 5\' 9"  (175.3 cm) Weight: 70.6 kg (155 lb 9.6 oz) IBW/kg (Calculated) : 70.7  Temp (24hrs), Avg:98.7 F (37.1 C), Min:98.4 F (36.9 C), Max:98.9 F (37.2 C)  Recent Labs  Lab 09/05/22 1026 09/05/22 2217 09/06/22 0308 09/07/22 0231 09/07/22 1425 09/08/22 0515 09/08/22 0922  WBC 13.0* 24.2* 43.8* 31.9*  --  24.8*  --   CREATININE 1.21 1.80* 2.19* 2.91* 3.04*  --  3.37*    Estimated Creatinine Clearance: 23.9 mL/min (A) (by C-G formula based on SCr of 3.37 mg/dL (H)).    No Known Allergies  Antimicrobials this admission: Cefepime 7/1 Zosyn 7/12 >> 7/14 Vanc PO (C diff +) 7/12 >> (7/26) Dapto 7/14 >>   Microbiology results: 7/1- Bcx- neg 7/1- peritoneal fluid cx- neg 7/12 C diff + 7/12 Peritoneal washing - VRE   Thank you for involving pharmacy in this patient's care.  Loura Back, PharmD, BCPS Clinical Pharmacist Clinical phone for 09/08/2022 is [phone] 09/08/2022 2:49 PM

## 2022-09-08 NOTE — Consult Note (Addendum)
Renal Service Consult Note [facility]  Samuel Blevins 09/08/2022 Maree Krabbe, MD Requesting Physician: Dr. Estill Cotta  Reason for Consult: Renal failure HPI: The patient is a 57 y.o. year-old w/ PMH as below who presented on 7/01 w/ c/o worsening abd pain, n/v and diarrhea. Was previously in hospital from  6/22-  6/26 for similar issues and left AMA after having paracentesis which showed SBP but IV abx had not yet been started. In ED HR high, normotensive, lipase 445, LA wnl, wbc 17k , bicarb 16, creat 1.1. pt was given IVF"s 1 L bolus, pain meds and IV cefepime to rx suspected SBP. CT abd pelvis showed no acute changes. GI consulted and dx was probable cirrhosis complicated by SBP, hx of etoh use, anemia and chronic pancreatitis. CT did not show pancreatic changes, lipase was 384. IV maxipime was continued. Pt refused IV albumin. Ascites cx from 7/03 was negative, repeat 3 L paracentesis showed no improvement in cell ct (pms 3200, 77% pmn), also fluid contained 10,000 u/L amylase. Cx was negative. MRI/ MRCP w/o PD dilation but they were concerned about pancreatic duct disruption. Octreotide was ^'d to 200 mg [place] bid. Cortrak was placed and was clogged. MRI showed nonspecific enteritis. Diagnosis was recurrent infected peritonitis since proven to be pancreatic ascites. Initial cx's were negative, f/u cx's grew VRE. Also tested + for CDif in stool.  Cortrak placed, octreotide and cefepime were continued. Abx changed to IV zosyn and po vancomycin. Creat started to rise on 7/11 up to 1.8, and yesterday 2.91 and today 3.37. UOP yest was 400 cc and today so far is 750 cc.   Inpatient meds - coreg 7/1- 7/3 - gabapentin 7/1- current - toradol IV 30mg  7/02 --> 7/07 dc'd - octreotide sq 100-200 mcg bid 7/05- current - protonix po 40 bid 7/01- current - carafate 7/01- current - IV cefepime 7/1- 7/8 - oxy IR 7/2- 7/9 - po vanc 7/12- current - IV zosyn 7/12 - current - trazodone hs  7/4 - 7/14 - IV contrast 75 cc (w/ abd / chest CT scan) - 7/01  Pt seen in room. "Am I going to die from this? "  Denies any hx kidney problems.   ROS - denies CP, no joint pain, no HA, no blurry vision, no rash, no diarrhea, no nausea/ vomiting, no dysuria, no difficulty voiding   Past Medical History  Past Medical History:  Diagnosis Date   Alcohol withdrawal syndrome with complication (HCC)    Alcoholism (HCC)    Elevated AST (SGOT)    Gastrointestinal hemorrhage    Homeless    Hypertension    Pancreatic insufficiency    takes Creon   Symptomatic anemia 11/23/2015   Thrombocytopenia (HCC) 05/09/2021   Past Surgical History  Past Surgical History:  Procedure Laterality Date   BIOPSY  06/23/2019   Procedure: BIOPSY;  Surgeon: Shellia Cleverly, DO;  Location: [facility] ENDOSCOPY;  Service: Gastroenterology;;   BIOPSY  12/12/2020   Procedure: BIOPSY;  Surgeon: Lynann Bologna, MD;  Location: [facility] ENDOSCOPY;  Service: Endoscopy;;   BIOPSY  08/04/2021   Procedure: BIOPSY;  Surgeon: Jenel Lucks, MD;  Location: [facility] ENDOSCOPY;  Service: Gastroenterology;;   ESOPHAGOGASTRODUODENOSCOPY N/A 08/04/2021   Procedure: ESOPHAGOGASTRODUODENOSCOPY (EGD);  Surgeon: Jenel Lucks, MD;  Location: [facility] ENDOSCOPY;  Service: Gastroenterology;  Laterality: N/A;   ESOPHAGOGASTRODUODENOSCOPY (EGD) WITH PROPOFOL N/A 06/23/2019   Procedure: ESOPHAGOGASTRODUODENOSCOPY (EGD) WITH PROPOFOL;  Surgeon: Shellia Cleverly, DO;  Location: [facility]  ENDOSCOPY;  Service: Gastroenterology;  Laterality: N/A;   ESOPHAGOGASTRODUODENOSCOPY (EGD) WITH PROPOFOL N/A 12/12/2020   Procedure: ESOPHAGOGASTRODUODENOSCOPY (EGD) WITH PROPOFOL;  Surgeon: Lynann Bologna, MD;  Location: [facility] ENDOSCOPY;  Service: Endoscopy;  Laterality: N/A;   HOT HEMOSTASIS N/A 06/23/2019   Procedure: HOT HEMOSTASIS (ARGON PLASMA COAGULATION/BICAP);  Surgeon: Shellia Cleverly, DO;  Location: [facility] ENDOSCOPY;  Service: Gastroenterology;  Laterality: N/A;   IR  PARACENTESIS  08/21/2022   IR PARACENTESIS  08/26/2022   IR PARACENTESIS  08/30/2022   IR PARACENTESIS  09/02/2022   IR PARACENTESIS  09/06/2022   Family History  Family History  Problem Relation Age of Onset   Diabetes Mellitus II Neg Hx    Colon cancer Neg Hx    Stomach cancer Neg Hx    Pancreatic cancer Neg Hx    Social History  reports that he has been smoking cigarettes. He has a 15 pack-year smoking history. He has never used smokeless tobacco. He reports current alcohol use of about 28.0 standard drinks of alcohol per week. He reports that he does not use drugs. Allergies No Known Allergies Home medications Prior to Admission medications   Medication Sig Start Date End Date Taking? Authorizing Provider  amLODipine (NORVASC) 5 MG tablet Take 5 mg by mouth daily.   Yes [provider]  carvedilol (COREG) 3.125 MG tablet Take 1 tablet (3.125 mg total) by mouth 2 (two) times daily with a meal. 07/10/22  Yes [name], MD  folic acid (FOLVITE) 1 MG tablet Take 1 tablet (1 mg total) by mouth daily. 07/14/22  Yes Rolly Salter, MD  hydrOXYzine (ATARAX) 25 MG tablet Take 25 mg by mouth at bedtime as needed for anxiety.   Yes [provider]  Iron, Ferrous Sulfate, 325 (65 Fe) MG TABS Take 1 tablet ( 325 mg) by mouth daily. 05/24/22  Yes [name], MD  lipase/protease/amylase (CREON) 12000-38000 units CPEP capsule Take 1 capsule (12,000 Units total) by mouth 3 (three) times daily before meals. 07/14/22  Yes Rolly Salter, MD  magnesium oxide (MAG-OX) 400 MG tablet Take 1 tablet (400 mg total) by mouth in the morning and at bedtime. 07/14/22  Yes Rolly Salter, MD  Multiple Vitamin (MULTIVITAMIN WITH MINERALS) TABS tablet Take 1 tablet by mouth daily. 07/14/22  Yes Rolly Salter, MD  ondansetron (ZOFRAN) 8 MG tablet Take 8 mg by mouth every 6 (six) hours as needed for nausea or vomiting.   Yes [provider]  pantoprazole (PROTONIX) 40 MG tablet Take 1  tablet (40 mg total) by mouth 2 (two) times daily before a meal. 07/14/22  Yes Rolly Salter, MD  polyethylene glycol (MIRALAX / GLYCOLAX) 17 g packet Take 17 g by mouth daily as needed for mild constipation or moderate constipation. 07/14/22  Yes Rolly Salter, MD  sucralfate (CARAFATE) 1 g tablet Take 1 g by mouth See admin instructions. Take 1 tablet by mouth 4 times daily with meals and bedtime   Yes [provider]  thiamine (VITAMIN B-1) 100 MG tablet Take 1 tablet (100 mg total) by mouth daily. 07/14/22  Yes Rolly Salter, MD  traMADol (ULTRAM) 50 MG tablet Take 1 tablet (50 mg total) by mouth every 8 (eight) hours as needed for severe pain. 07/17/22  Yes [name], MD  nicotine (NICODERM CQ - DOSED IN MG/24 HOURS) 14 mg/24hr patch Place 1 patch (14 mg total) onto the skin daily. Patient not taking: Reported on  08/21/2022 07/14/22   Rolly Salter, MD     Vitals:   09/08/22 0007 09/08/22 0410 09/08/22 0752 09/08/22 1120  BP: (!) 131/96 (!) 122/93  (!) 130/94  Pulse: 99 99  (!) 104  Resp: 13 14  20   Temp:  98.8 F (37.1 C) 98.6 F (37 C)   TempSrc:  Oral Oral   SpO2: 99% 100%  100%  Weight:  70.6 kg    Height:       Exam Gen alert, no distress, chronically ill appearing, nasal O2 No rash, cyanosis or gangrene Sclera anicteric, throat clear  No jvd or bruits Chest clear bilat to bases, no rales/ wheezing RRR no RG Abd soft ntnd no mass, 2+ ascites, +bs GU defer MS no joint effusions or deformity Ext no LE or UE edema, no wounds or ulcers Neuro is alert, Ox 3 , nf     Home meds include - norvasc, coreg, folvite, atarax, iron, creon, max-ox, mVI, zofran, protonix, miralax, carafate, thiamine, ultram, nicotine patch   Inpatient meds - coreg 7/1- 7/3 - toradol IV 30mg  7/02 --> 7/07 dc'd - octreotide sq 100-200 mcg bid 7/05- current - protonix po 40 bid 7/01- current - IV cefepime 7/1- 7/8 - po vanc 7/12- current - IV zosyn 7/12 - current - IV contrast  75 cc on 7/01  (w/ abd / chest CT scan)   MRI w/ and w/o contrast abdomen 7/05 -->  Adrenals/Urinary Tract: Adrenal glands are unremarkable. Kidneys are normal, without renal calculi, solid lesion, or hydronephrosis.   Assessment/ Plan: AKI - b/l creatinine is 0.90- 1.04, eGFR > 27ml/min from this admission. Creat bumped up on 7/12 to 2.1 and today is 3.35.  Pt was exposed to IV nsaids here but this prior to bump in creatinine, same with iodinated IV contrast given on 7/01. BP's have not been significantly low this whole admission. Pt has ascites and so suspected portal HTN. Weights are down and there may be element of hypovolemia. Possible causes of AKI would be AIN due to IV abx, ATN, hypovolemia and/or HRS.  Normal BP's and good UOP argue against HRS. If HRS he is already getting sq octreotide and IV albumin. Will get UA , UNa, UCr and renal US.  Will follow.  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   Preferred narcotic agents for pain control are hydromorphone, fentanyl, and methadone. Morphine should not be used.  Avoid Baclofen and avoid oral sodium phosphate and magnesium citrate based laxatives / bowel preps.  Continue strict Input and Output monitoring.  Will monitor the patient closely with you and intervene or adjust therapy as indicated by changes in clinical status/labs     Recent Labs  Lab 09/06/22 0308 09/07/22 0231 09/07/22 1425 09/08/22 0515 09/08/22 0922  HGB 9.7* 7.5*  --  7.5*  --   ALBUMIN 2.0*  2.0*  --   --  2.4*  CALCIUM 8.5* 8.1* 8.2*  --  8.4*  PHOS 3.3 4.2  --   --   --  CREATININE 2.19* 2.91* 3.04*  --  3.37*  K 5.4* 5.8* 5.3*  --  5.4*   Inpatient medications:  acidophilus  2 capsule Oral TID   enoxaparin (LOVENOX) injection  30 mg Subcutaneous Q24H   feeding supplement  237 mL Oral BID BM   feeding supplement (PROSource TF20)  60 mL Per Tube Daily   folic acid  1 mg Oral Daily   gabapentin  100 mg Oral TID   lipase/protease/amylase  12,000 Units Oral TID AC   multivitamin with minerals  1 tablet Oral Daily   octreotide  200 mcg Subcutaneous Q12H   pantoprazole  40 mg Oral BID AC   sodium chloride flush  10-40 mL Intracatheter Q12H   sucralfate  1 g Oral TID PC & HS   thiamine  100 mg Oral Daily   vancomycin  125 mg Oral Q6H    albumin human Stopped (09/08/22 1344)   DAPTOmycin (CUBICIN) 550 mg in sodium chloride 0.9 % IVPB     feeding supplement (VITAL 1.5 CAL) Stopped (09/06/22 0500)   acetaminophen, guaiFENesin, hydrALAZINE, hydrOXYzine, ipratropium-albuterol, metoprolol tartrate, naLOXone (NARCAN)  injection, ondansetron (ZOFRAN) IV, oxyCODONE, senna-docusate, sodium chloride flush, traZODone

## 2022-09-09 DIAGNOSIS — B999 Unspecified infectious disease: Secondary | ICD-10-CM

## 2022-09-09 DIAGNOSIS — R188 Other ascites: Secondary | ICD-10-CM | POA: Diagnosis not present

## 2022-09-09 DIAGNOSIS — E43 Unspecified severe protein-calorie malnutrition: Secondary | ICD-10-CM

## 2022-09-09 DIAGNOSIS — K859 Acute pancreatitis without necrosis or infection, unspecified: Secondary | ICD-10-CM | POA: Diagnosis not present

## 2022-09-09 DIAGNOSIS — K709 Alcoholic liver disease, unspecified: Secondary | ICD-10-CM | POA: Diagnosis not present

## 2022-09-09 DIAGNOSIS — A0472 Enterocolitis due to Clostridium difficile, not specified as recurrent: Secondary | ICD-10-CM | POA: Diagnosis not present

## 2022-09-09 DIAGNOSIS — K861 Other chronic pancreatitis: Secondary | ICD-10-CM | POA: Diagnosis not present

## 2022-09-09 DIAGNOSIS — K852 Alcohol induced acute pancreatitis without necrosis or infection: Secondary | ICD-10-CM | POA: Diagnosis not present

## 2022-09-09 DIAGNOSIS — K659 Peritonitis, unspecified: Secondary | ICD-10-CM | POA: Diagnosis not present

## 2022-09-09 DIAGNOSIS — N179 Acute kidney failure, unspecified: Secondary | ICD-10-CM

## 2022-09-09 LAB — CBC
HCT: 22.1 % — ABNORMAL LOW (ref 39.0–52.0)
Hemoglobin: 7.2 g/dL — ABNORMAL LOW (ref 13.0–17.0)
MCH: 28.2 pg (ref 26.0–34.0)
MCHC: 32.6 g/dL (ref 30.0–36.0)
MCV: 86.7 fL (ref 80.0–100.0)
Platelets: 717 10*3/uL — ABNORMAL HIGH (ref 150–400)
RBC: 2.55 MIL/uL — ABNORMAL LOW (ref 4.22–5.81)
RDW: 21.1 % — ABNORMAL HIGH (ref 11.5–15.5)
WBC: 20.4 10*3/uL — ABNORMAL HIGH (ref 4.0–10.5)
nRBC: 0 % (ref 0.0–0.2)

## 2022-09-09 LAB — RENAL FUNCTION PANEL
Albumin: 2.9 g/dL — ABNORMAL LOW (ref 3.5–5.0)
Anion gap: 11 (ref 5–15)
BUN: 33 mg/dL — ABNORMAL HIGH (ref 6–20)
CO2: 24 mmol/L (ref 22–32)
Calcium: 8.3 mg/dL — ABNORMAL LOW (ref 8.9–10.3)
Chloride: 97 mmol/L — ABNORMAL LOW (ref 98–111)
Creatinine, Ser: 3.52 mg/dL — ABNORMAL HIGH (ref 0.61–1.24)
GFR, Estimated: 19 mL/min — ABNORMAL LOW (ref >60)
Glucose, Bld: 91 mg/dL (ref 70–99)
Phosphorus: 5.1 mg/dL — ABNORMAL HIGH (ref 2.5–4.6)
Potassium: 4.5 mmol/L (ref 3.5–5.1)
Sodium: 132 mmol/L — ABNORMAL LOW (ref 135–145)

## 2022-09-09 LAB — GLUCOSE, CAPILLARY
Glucose-Capillary: 115 mg/dL — ABNORMAL HIGH (ref 70–99)
Glucose-Capillary: 118 mg/dL — ABNORMAL HIGH (ref 70–99)
Glucose-Capillary: 81 mg/dL (ref 70–99)
Glucose-Capillary: 83 mg/dL (ref 70–99)
Glucose-Capillary: 92 mg/dL (ref 70–99)
Glucose-Capillary: 94 mg/dL (ref 70–99)

## 2022-09-09 LAB — MAGNESIUM: Magnesium: 2.2 mg/dL (ref 1.7–2.4)

## 2022-09-09 MED ORDER — ACETAMINOPHEN 325 MG PO TABS
650.0000 mg | ORAL_TABLET | Freq: Four times a day (QID) | ORAL | Status: DC
  Administered 2022-09-09 – 2022-09-10 (×4): 650 mg via ORAL
  Filled 2022-09-09 (×4): qty 2

## 2022-09-09 MED ORDER — SODIUM CHLORIDE 0.9 % IV SOLN
10.0000 mg/kg | INTRAVENOUS | Status: DC
  Administered 2022-09-10: 700 mg via INTRAVENOUS
  Filled 2022-09-09: qty 14

## 2022-09-09 MED ORDER — GABAPENTIN 300 MG PO CAPS
300.0000 mg | ORAL_CAPSULE | Freq: Every day | ORAL | Status: DC
  Administered 2022-09-10 – 2022-09-17 (×8): 300 mg via ORAL
  Filled 2022-09-09 (×8): qty 1

## 2022-09-09 MED ORDER — LIDOCAINE 5 % EX PTCH
1.0000 | MEDICATED_PATCH | CUTANEOUS | Status: DC
  Administered 2022-09-09 – 2022-09-16 (×8): 1 via TRANSDERMAL
  Filled 2022-09-09 (×8): qty 1

## 2022-09-09 NOTE — Plan of Care (Signed)

## 2022-09-09 NOTE — Progress Notes (Signed)
Mobility Specialist: Progress Note    09/09/22 1541  Mobility  Activity Ambulated with assistance in hallway  Level of Assistance Standby assist, set-up cues, supervision of patient - no hands on  Assistive Device Four wheel walker  Distance Ambulated (ft) 75 ft  Activity Response Tolerated well  Mobility Referral Yes  $Mobility charge 1 Mobility  Mobility Specialist Start Time (ACUTE ONLY) 1530  Mobility Specialist Stop Time (ACUTE ONLY) 1540  Mobility Specialist Time Calculation (min) (ACUTE ONLY) 10 min   Pt agreeable to mobility session. No c/o physical pain throughout, distance limited d/t pt saying "I don't like to talk". Pt returned to bed with all needs met.   Maurene Capes Mobility Specialist Please contact via SecureChat or Rehab office at [phone]

## 2022-09-09 NOTE — Progress Notes (Signed)
Nutrition Follow-up  DOCUMENTATION CODES:   Severe malnutrition in context of chronic illness  INTERVENTION:  - Restart Vital 1.5 at 55 mL/hour (1320 mL goal daily volume)  -Provide PROSource TF20 60 mL daily per tube -Provides: 2060 kcal, 109 grams of protein, 1003 mL H2O daily  - Continue Full Liquid diet   NUTRITION DIAGNOSIS:   Severe Malnutrition related to chronic illness (pancreatitis) as evidenced by severe fat depletion, severe muscle depletion, moderate fat depletion, moderate muscle depletion.  GOAL:   Patient will meet greater than or equal to 90% of their needs - Not met, restart TF.  MONITOR:   PO intake, Supplement acceptance, Labs, Weight trends, TF tolerance, I & O's  REASON FOR ASSESSMENT:   Consult Enteral/tube feeding initiation and management  ASSESSMENT:   57 year old male with PMHx of EtOH abuse, chronic pancreatitis, chronic pancreatic insufficiency, HTN who is admitted with persistent/recurrent infected peritonitis now with infected pancreatic ascites likely from pancreatic duct disruption.  Meds reviewed: risaquad, folic acid, creon, MVI, thiamine. Labs reviewed: Na low, BUN/Creatinine elevated, phos elevated.   Per EMR, TF have been off since 0500 on 7/12. Discussed with RN about restarting TF. No contraindications mentioned in MD's note. RN spoke with MD and confirmed. Plan is to start TF back at goal rate. Pt is on Full Liquid diet. )% of intakes. RD will continue to monitor TF tolerance.   Diet Order:   Diet Order             Diet full liquid Fluid consistency: Thin  Diet effective now                   EDUCATION NEEDS:   No education needs have been identified at this time  Skin:  Skin Assessment: Reviewed RN Assessment  Last BM:  7/14 - type 5  Height:   Ht Readings from Last 1 Encounters:  08/26/22 5\' 9"  (1.753 m)    Weight:   Wt Readings from Last 1 Encounters:  09/09/22 71.8 kg    Ideal Body Weight:  72.7  kg  BMI:  Body mass index is 23.38 kg/m.  Estimated Nutritional Needs:   Kcal:  2100-2300  Protein:  105-115 grams  Fluid:  2.1-2.3 L/day  Bethann Humble, RD, LDN, CNSC.

## 2022-09-09 NOTE — Progress Notes (Signed)
Admit: 08/26/2022 LOS: 13  77M chronic pacreatitis and pancreatic ascites 2/2 presumed pancreatic duct disruption; secondary peritonitis with VRI; CDI; AKI with normal baseline GFR; ETOH user  Subjective:  He refuses to talk to me today; this is my first time meeting me; he won't say why he refuses  SCr up to 3.5 from 3.35, K 4.5, Na 132 WBC downtrending 0.55L UOP UA had no RBC, 11-20 WBC, UNa 31; FeNA 3.7% ABX changed to daptomycin yesterday by [facility] Renal US negative for acute process 7/14, no HN or obstruction  07/14 0701 - 07/15 0700 In: 1422.8 [P.O.:870; I.V.:10; NG/GT:30; IV Piggyback:512.8] Out: 550 [Urine:550]  Filed Weights   09/07/22 0451 09/08/22 0410 09/09/22 0421  Weight: 71.2 kg 70.6 kg 71.8 kg    Scheduled Meds:  acetaminophen  650 mg Oral Q6H   acidophilus  2 capsule Oral TID   enoxaparin (LOVENOX) injection  30 mg Subcutaneous Q24H   feeding supplement  237 mL Oral BID BM   feeding supplement (PROSource TF20)  60 mL Per Tube Daily   folic acid  1 mg Oral Daily   [START ON 09/10/2022] gabapentin  300 mg Oral QHS   lidocaine  1 patch Transdermal Q24H   lipase/protease/amylase  12,000 Units Oral TID AC   multivitamin with minerals  1 tablet Oral Daily   octreotide  200 mcg Subcutaneous Q12H   pantoprazole  40 mg Oral BID AC   sodium chloride flush  10-40 mL Intracatheter Q12H   thiamine  100 mg Oral Daily   vancomycin  125 mg Oral Q6H   Continuous Infusions:  [START ON 09/10/2022] DAPTOmycin (CUBICIN) 700 mg in sodium chloride 0.9 % IVPB     feeding supplement (VITAL 1.5 CAL) Stopped (09/06/22 0500)   PRN Meds:.guaiFENesin, hydrALAZINE, hydrOXYzine, ipratropium-albuterol, metoprolol tartrate, naLOXone (NARCAN)  injection, ondansetron (ZOFRAN) IV, oxyCODONE, senna-docusate, sodium chloride flush, traZODone  Current Labs: reviewed  09/08/22 FeNA 3.7%  Physical Exam:  Blood pressure 134/88, pulse (!) 107, temperature 99 F (37.2 C), temperature source Oral,  resp. rate 18, height 5\' 9"  (1.753 m), weight 71.8 kg, SpO2 98%. NAD, awake Aggressive affect He declined any further exam  A AKI normal GFR at baseline suspect ATN or AIN Neg renal US 7/14 FeNA 3.7% 7/14; UA no RBC but WBC present Not consistent with HRS nor is his GI issue supportive of that Chronic pancreatitis and concern for pancreatic duct disruption, GI following likely to req [facility] eval VRE peritontitis, ABX change to dapto 7/14 with [facility] CDI on PO vanc, improving Alcohol abuse HTN, BPs stable  P Cont supportive care No indication for dialysis Change in ABX would be requisite step for ? AIN, no role for steroids Medication Issues; Preferred narcotic agents for pain control are hydromorphone, fentanyl, and methadone. Morphine should not be used.  Baclofen should be avoided Avoid oral sodium phosphate and magnesium citrate based laxatives / bowel preps    Sabra Heck MD 09/09/2022, 11:56 AM  Recent Labs  Lab 09/06/22 0308 09/07/22 0231 09/07/22 1425 09/08/22 0922 09/08/22 1636 09/09/22 0435  NA 135 130*   < > 131* 132* 132*  K 5.4* 5.8*   < > 5.4* 5.0 4.5  CL 100 98   < > 95* 93* 97*  CO2 20* 24   < > 25 23 24   GLUCOSE 167* 105*   < > 98 114* 91  BUN 22* 32*   < > 31* 32* 33*  CREATININE 2.19* 2.91*   < >  3.37* 3.35* 3.52*  CALCIUM 8.5* 8.1*   < > 8.4* 8.5* 8.3*  PHOS 3.3 4.2  --   --   --  5.1*   < > = values in this interval not displayed.   Recent Labs  Lab 09/05/22 2217 09/06/22 0308 09/07/22 0231 09/08/22 0515 09/09/22 0435  WBC 24.2*   < > 31.9* 24.8* 20.4*  NEUTROABS 21.2*  --   --   --   --   HGB 11.6*   < > 7.5* 7.5* 7.2*  HCT 35.9*   < > 22.2* 22.4* 22.1*  MCV 87.3   < > 84.4 84.2 86.7  PLT 1,107*   < > 739* 739* 717*   < > = values in this interval not displayed.

## 2022-09-09 NOTE — Progress Notes (Signed)
[facility]   Reason for visit: Follow up on VRE infection, recurrent pancreatitis  Interval History: WBC improving to 20.4, no fever.     Physical Exam: Constitutional:  Vitals:   09/09/22 0731 09/09/22 1129  BP: (!) 141/90 134/88  Pulse: (!) 103 (!) 107  Resp:  18  Temp: 98.6 F (37 C) 99 F (37.2 C)  SpO2:     patient appears in NAD Respiratory: Normal respiratory effort  Review of Systems: Constitutional: negative for fevers and chills  Lab Results  Component Value Date   WBC 20.4 (H) 09/09/2022   HGB 7.2 (L) 09/09/2022   HCT 22.1 (L) 09/09/2022   MCV 86.7 09/09/2022   PLT 717 (H) 09/09/2022    Lab Results  Component Value Date   CREATININE 3.52 (H) 09/09/2022   BUN 33 (H) 09/09/2022   NA 132 (L) 09/09/2022   K 4.5 09/09/2022   CL 97 (L) 09/09/2022   CO2 24 09/09/2022    Lab Results  Component Value Date   ALT 14 09/08/2022   AST 25 09/08/2022   ALKPHOS 85 09/08/2022     Microbiology: Recent Results (from the past 240 hour(s))  Gram stain     Status: None   Collection Time: 09/02/22  2:47 PM   Specimen: Abdomen; Peritoneal Fluid  Result Value Ref Range Status   Specimen Description ABDOMEN  Final   Special Requests NONE  Final   Gram Stain   Final    FEW WBC SEEN NO ORGANISMS SEEN Performed at [facility], [address] [place], [place] [address]    Report Status 09/02/2022 FINAL  Final  Culture, body fluid w Gram Stain-bottle     Status: Abnormal   Collection Time: 09/06/22  9:18 AM   Specimen: Peritoneal Washings  Result Value Ref Range Status   Specimen Description PERITONEAL  Final   Special Requests NONE  Final   Gram Stain   Final    GRAM POSITIVE COCCI IN CHAINS IN BOTH AEROBIC AND ANAEROBIC BOTTLES CRITICAL RESULT CALLED TO, READ BACK BY AND VERIFIED WITH: RN YOKO IMAI  ON 09/06/22 @ 1738 BY DRT Performed at [facility], [address] [place], [place] [address]    Culture (A)  Final     ENTEROCOCCUS FAECIUM VANCOMYCIN RESISTANT ENTEROCOCCUS    Report Status 09/08/2022 FINAL  Final   Organism ID, Bacteria ENTEROCOCCUS FAECIUM  Final      Susceptibility   Enterococcus faecium - MIC*    AMPICILLIN >=32 RESISTANT Resistant     VANCOMYCIN >=32 RESISTANT Resistant     GENTAMICIN SYNERGY SENSITIVE Sensitive     * ENTEROCOCCUS FAECIUM  Gram stain     Status: None   Collection Time: 09/06/22  9:18 AM   Specimen: Peritoneal Washings  Result Value Ref Range Status   Specimen Description PERITONEAL  Final   Special Requests NONE  Final   Gram Stain   Final    ABUNDANT WBC PRESENT, PREDOMINANTLY PMN NO ORGANISMS SEEN Performed at [facility], [address] [place], [place] [address]    Report Status 09/06/2022 FINAL  Final  C Difficile Quick Screen (NO PCR Reflex)     Status: Abnormal   Collection Time: 09/06/22 10:47 AM   Specimen: STOOL  Result Value Ref Range Status   C Diff antigen POSITIVE (A) NEGATIVE Final   C Diff toxin POSITIVE (A) NEGATIVE Final    Comment: CRITICAL  RESULT CALLED TO, READ BACK BY AND VERIFIED WITH:  09/06/2022 1047 RN ROOSEVELT    C Diff interpretation Toxin producing C. difficile detected.  Final    Comment: Performed at [facility], [address] [place], [place] [address]    Impression/Plan:  1. Peritonitis - VRE in culture with 16,390 WBCs and on daptomycin.  Duration will depend on improvement.  Continue with daptomycin    2. C diff colitis - positive testing  and on oral vancomycin.  Will continue.  Plan for extended treatment while on antibiotics for #1.    3.  Acute renal insufficiency - creat elevated and stable compared to yesterday.  Will continue to monitor.

## 2022-09-09 NOTE — Plan of Care (Signed)
  Problem: Fluid Volume: Goal: Hemodynamic stability will improve Outcome: Progressing   Problem: Clinical Measurements: Goal: Diagnostic test results will improve Outcome: Progressing Goal: Signs and symptoms of infection will decrease Outcome: Progressing   Problem: Education: Goal: Knowledge of General Education information will improve Description: Including pain rating scale, medication(s)/side effects and non-pharmacologic comfort measures Outcome: Progressing   Problem: Health Behavior/Discharge Planning: Goal: Ability to manage health-related needs will improve Outcome: Progressing   Problem: Clinical Measurements: Goal: Ability to maintain clinical measurements within normal limits will improve Outcome: Progressing Goal: Will remain free from infection Outcome: Progressing Goal: Diagnostic test results will improve Outcome: Progressing Goal: Respiratory complications will improve Outcome: Progressing Goal: Cardiovascular complication will be avoided Outcome: Progressing   Problem: Activity: Goal: Risk for activity intolerance will decrease Outcome: Progressing   Problem: Nutrition: Goal: Adequate nutrition will be maintained Outcome: Progressing   Problem: Coping: Goal: Level of anxiety will decrease Outcome: Progressing   Problem: Elimination: Goal: Will not experience complications related to bowel motility Outcome: Progressing Goal: Will not experience complications related to urinary retention Outcome: Progressing   Problem: Pain Managment: Goal: General experience of comfort will improve Outcome: Progressing   Problem: Safety: Goal: Ability to remain free from injury will improve Outcome: Progressing   Problem: Skin Integrity: Goal: Risk for impaired skin integrity will decrease Outcome: Progressing   Problem: Education: Goal: Ability to describe self-care measures that may prevent or decrease complications (Diabetes Survival Skills  Education) will improve Outcome: Progressing Goal: Individualized Educational Video(s) Outcome: Progressing   Problem: Coping: Goal: Ability to adjust to condition or change in health will improve Outcome: Progressing   Problem: Fluid Volume: Goal: Ability to maintain a balanced intake and output will improve Outcome: Progressing   Problem: Health Behavior/Discharge Planning: Goal: Ability to identify and utilize available resources and services will improve Outcome: Progressing Goal: Ability to manage health-related needs will improve Outcome: Progressing   Problem: Metabolic: Goal: Ability to maintain appropriate glucose levels will improve Outcome: Progressing   Problem: Nutritional: Goal: Maintenance of adequate nutrition will improve Outcome: Progressing Goal: Progress toward achieving an optimal weight will improve Outcome: Progressing   Problem: Skin Integrity: Goal: Risk for impaired skin integrity will decrease Outcome: Progressing   Problem: Tissue Perfusion: Goal: Adequacy of tissue perfusion will improve Outcome: Progressing

## 2022-09-09 NOTE — Progress Notes (Addendum)
Daily Progress Note  DOA: 08/26/2022 Hospital Day: 15 Chief Complaint: chronic pancreatitis, pancreatic duct disruption   Attending physician's note   I have taken a history, reviewed the chart and examined the patient. I performed a substantive portion of this encounter, including complete performance of at least one of the key components, in conjunction with the APP. I agree with the APP's note, impression and recommendations.    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 Difficile On daptomycin, ID following. WBC count is improving.  Avoid large volume paracentesis, okay to proceed with diagnostic paracentesis, avoid removing more than 100 to 200 cc of ascites fluid given AKI Continue tube feeds and oral intake as tolerated C. difficile improving on oral vancomycin  Will discuss with Dr. Meridee Score regarding pancreatic duct stenting versus transfer to [facility] for further management of pancreatic duct obstruction  GI will continue to follow along  The patient was provided an opportunity to ask questions and all were answered. The patient agreed with the plan and demonstrated an understanding of the instructions.   [name] , MD [phone]     Assessment and Plan:    Brief Narrative:  Samuel Blevins is a 57 y.o. year old male with chronic pancreatitis, etoh abuse, admitted with persistent / recurrent infected peritonitis. Previously, SAAG was not compatible with portal hypertension which now makes since as this is pancreatic ascites with large amount of amylase in ascitic fluid.  However he could still have cirrhosis as imaging this admission coincidentally does show a"nodular" contour of fatty liver. More recent imaging ( MRI) shows only hepatomegaly and steatosis   Chronic pancreatitis, now with infected pancreatic ascites likely from  pancreatic duct disruption.  -Continue Octreotide 200 mg SQ BID to decrease pancreatic secretions -WBC improving 31 K >> 24.8K>> 20l.4K.  VRE in culture.On Daptomycin.  ID following.  -Continue Cortrak feedings -Continue Creon 12K units TID -Patient tells me he is supposed to be getting another paracentesis today ?? I don't see that ordered and not sure he really needs a LVP, especially in setting of renal dysfunction. Maybe it was a diagnostic paracentesis which would make since.  -Possible ERCP for pancreatic duct stenting vrs transfer to tertiary care center.   AKI -Nephrology following -Renal US negative for acute findings.   C-diff Continue oral Vancomycin , on day # 4.   Subjective / New Events:  No diarrhea. Generalized abdominal pain 8/10 but tolerating cortrak. Had ambulated in halls earlier today.    Objective:   Recent Labs    09/07/22 0231 09/08/22 0515 09/09/22 0435  WBC 31.9* 24.8* 20.4*  HGB 7.5* 7.5* 7.2*  HCT 22.2* 22.4* 22.1*  PLT 739* 739* 717*   BMET Recent Labs    09/08/22 0922 09/08/22 1636 09/09/22 0435  NA 131* 132* 132*  K 5.4* 5.0 4.5  CL 95* 93* 97*  CO2 25 23 24   GLUCOSE 98 114* 91  BUN 31* 32* 33*  CREATININE 3.37* 3.35* 3.52*  CALCIUM 8.4* 8.5* 8.3*   LFT Recent Labs    09/08/22 0922 09/09/22 0435  PROT 7.6  --   ALBUMIN 2.4* 2.9*  AST 25  --   ALT 14  --   ALKPHOS 85  --   BILITOT 0.8  --    PT/INR No results for input(s): "LABPROT", "INR" in the last 72 hours.   Imaging:  US RENAL CLINICAL  DATA:  Acute kidney injury, decompensated cirrhosis  EXAM: RENAL / URINARY TRACT ULTRASOUND COMPLETE  COMPARISON:  None Available.  FINDINGS: Right Kidney:  Renal measurements: 10.8 x 6.0 x 6.0 cm = volume: 195 mL. Echogenicity within normal limits. The lower pole the right kidney is obscured by overlying bowel gas. No mass or hydronephrosis visualized.  Left Kidney:  Renal measurements: 12.8 x 6.8 x 6.0 cm = volume:  222 mL. Echogenicity within normal limits. The upper pole of the left kidney is obscured by overlying bowel gas and osseous structures. No mass or hydronephrosis visualized.  Bladder:  Appears normal for degree of bladder distention.  Other:  Mild perihepatic ascites is present.  IMPRESSION: 1. No hydronephrosis. 2. Mild ascites.  Electronically Signed   By: Helyn Numbers M.D.   On: 09/08/2022 22:05     Scheduled inpatient medications:   acetaminophen  650 mg Oral Q6H   acidophilus  2 capsule Oral TID   enoxaparin (LOVENOX) injection  30 mg Subcutaneous Q24H   feeding supplement  237 mL Oral BID BM   feeding supplement (PROSource TF20)  60 mL Per Tube Daily   folic acid  1 mg Oral Daily   [START ON 09/10/2022] gabapentin  300 mg Oral QHS   lidocaine  1 patch Transdermal Q24H   lipase/protease/amylase  12,000 Units Oral TID AC   multivitamin with minerals  1 tablet Oral Daily   octreotide  200 mcg Subcutaneous Q12H   pantoprazole  40 mg Oral BID AC   sodium chloride flush  10-40 mL Intracatheter Q12H   thiamine  100 mg Oral Daily   vancomycin  125 mg Oral Q6H   Continuous inpatient infusions:   [START ON 09/10/2022] DAPTOmycin (CUBICIN) 700 mg in sodium chloride 0.9 % IVPB     feeding supplement (VITAL 1.5 CAL) 1,000 mL (09/09/22 1333)   PRN inpatient medications: guaiFENesin, hydrALAZINE, hydrOXYzine, ipratropium-albuterol, metoprolol tartrate, naLOXone (NARCAN)  injection, ondansetron (ZOFRAN) IV, oxyCODONE, senna-docusate, sodium chloride flush, traZODone  Vital signs in last 24 hours: Temp:  [98.1 F (36.7 C)-99 F (37.2 C)] 99 F (37.2 C) (07/15 1129) Pulse Rate:  [98-107] 107 (07/15 1129) Resp:  [16-20] 18 (07/15 1129) BP: (124-141)/(85-96) 134/88 (07/15 1129) SpO2:  [97 %-99 %] 98 % (07/15 0421) Weight:  [71.8 kg] 71.8 kg (07/15 0421) Last BM Date : 09/08/22  Intake/Output Summary (Last 24 hours) at 09/09/2022 1451 Last data filed at 09/09/2022  1103 Gross per 24 hour  Intake 881.13 ml  Output 550 ml  Net 331.13 ml    Intake/Output from previous day: 07/14 0701 - 07/15 0700 In: 1422.8 [P.O.:870; I.V.:10; NG/GT:30; IV Piggyback:512.8] Out: 550 [Urine:550] Intake/Output this shift: Total I/O In: -  Out: 300 [Urine:300]   Physical Exam:  General: Alert male in NAD Heart:  Regular rate and rhythm.  Pulmonary: Normal respiratory effort Abdomen: Soft, moderately distended, nontender. Normal bowel sounds. Neurologic: Alert and oriented Psych: Pleasant. Cooperative but irritable.     Principal Problem:   Sepsis (HCC) Active Problems:   Alcohol dependence syndrome (HCC)   Alcoholic pancreatitis   Acute on chronic pancreatitis (HCC)   Pancreatitis   Acute pancreatitis   Other ascites   Protein-calorie malnutrition, severe   Alcoholic liver disease (HCC)   Pancreatic duct disruption   Anasarca   SIRS (systemic inflammatory response syndrome) (HCC)   Infected ascites     LOS: 13 days   Willette Cluster ,NP 09/09/2022, 2:51 PM

## 2022-09-09 NOTE — Progress Notes (Signed)
PROGRESS NOTE    Samuel Blevins  GMW:[id number] DOB: Jan 30, 1966 DOA: 08/26/2022 PCP: [name], MD   Brief Narrative:  57 year old with history of recurrent pancreatitis, chronic pancreatic insufficiency, HTN , recurrent alcohol use comes to the hospital with complaints of abdominal pain, nausea, vomiting and diarrhea.  Patient was also found to have acute recurrent alcoholic pancreatitis.  Initially patient had left AMA about 4-5 days prior to this admission.  Patient readmitted for acute recurrent alcoholic pancreatitis with worsening ascites, initial labs of ascites fluid indicate elevated amylase concerning for pancreatic duct disruption.  Hospital course complicated with presence of recurrent ascites, infected ascites fluid, ongoing abdominal pain and acute renal failure.  Followed by GI and nephrology.  Assessment & Plan:   Principal Problem:   Sepsis (HCC) Active Problems:   Alcohol dependence syndrome (HCC)   Alcoholic pancreatitis   Acute on chronic pancreatitis (HCC)   Pancreatitis   Acute pancreatitis   Other ascites   Protein-calorie malnutrition, severe   Alcoholic liver disease (HCC)   Pancreatic duct disruption   Anasarca   SIRS (systemic inflammatory response syndrome) (HCC)   Infected ascites  Decompensated liver cirrhosis with ascites Sepsis secondary to SBP versus infected pancreatic ascites Hypoalbuminemia Hypovolemic hyponatremia -Sepsis evidenced by tachycardia and leukocytosis.  Source presumed pancreatic duct disruption(elevated amylase on paracentesis) and presumed SBP. -Paracentesis 7/1 - 3L; 7/5 - 3L; 7/8 2L,  Echo: EF 60-65% with normal LVF and grade 1 diastolic dysfunction -Possible need for ERCP/pancreatic duct stenting per GI given questionable pancreatic duct disruption (amylase in ascites) -7/8 cortrak placed in distal duodenum per GI recs - continue tube feeds(monitor for refeeding syndrome) -Blood culture, paracentesis fluid culture  remain negative in the beginning but repeat paracentesis fluid on 09/06/2022 gross gram-positive cocci that is identified as VRE.  Now remains on daptomycin.  Followed by ID. -Urine culture shows multiple species, likely contaminant -Remains on octreotide. Poor prognosis.  C. difficile colitis: Patient also started having diarrhea on 09/06/2022, stool was tested positive for C. difficile, started on p.o. vancomycin by GI.  Diarrhea improved.  Acute hypoxic respiratory failure: Overnight on the night of 09/05/2022, patient went into respiratory distress, tachycardia and tachypnea and moved to progressive unit.  Chest x-ray was done which ruled out acute pathology but he did have some atelectasis, likely secondary to enlarging ascites.  Patient felt better after paracentesis.  Currently he is stable and comfortable on room air.  Recurrent alcohol induced pancreatitis. Nausea vomiting, resolved -Secondary to chronic alcohol use/abuse p.o. intake as tolerated, pain control avoiding IV narcotics, antiemetics, PPI   Severe protein caloric malnutrition High risk for refeeding syndrome -Complicated by chronic alcohol use -Core track placed 7/5 -dysfunctional 7/6 -replaced 09/02/2022, noted to be distal to the proximal duodenum. -Continue to follow electrolytes, mag/Phos.  Repleted as appropriate   Alcohol abuse - Alcohol withdrawal protocol completed, currently outside the window for any further withdrawal symptoms.  No further need for benzos IV versus p.o.    Essential hypertension -Currently well-controlled off medications, as needed hydralazine for hypertensive events   GERD/duodenitis -Continue PPI  Acute kidney injury with hyperkalemia: Followed by nephrology.  Treated with Lokelma.  Urine output is adequate.  Anemia of chronic disease: Hemoglobin dropping gradually, 7.5 today.   DVT prophylaxis: enoxaparin (LOVENOX) injection 30 mg Start: 09/07/22 1700   Code Status: Full Code  Family  Communication:  None present at bedside.  Plan of care discussed with patient in length and he/she verbalized understanding and  agreed with it.  Status is: Inpatient Remains inpatient appropriate because: Needs ERCP, timing to be determined by GI.  GI is also planning to transfer him to [facility].   Estimated body mass index is 23.38 kg/m as calculated from the following:   Height as of this encounter: 5\' 9"  (1.753 m).   Weight as of this encounter: 71.8 kg.    Nutritional Assessment: Body mass index is 23.38 kg/m.Marland Kitchen Seen by dietician.  I agree with the assessment and plan as outlined below: Nutrition Status: Nutrition Problem: Severe Malnutrition Etiology: chronic illness (pancreatitis) Signs/Symptoms: severe fat depletion, severe muscle depletion, moderate fat depletion, moderate muscle depletion Interventions: Refer to RD note for recommendations  . Skin Assessment: I have examined the patient's skin and I agree with the wound assessment as performed by the wound care RN as outlined below:    Consultants:  GI Nephrology Infectious disease  Procedures:  As above  Antimicrobials:  Anti-infectives (From admission, onward)    Start     Dose/Rate Route Frequency Ordered Stop   09/10/22 1600  DAPTOmycin (CUBICIN) 700 mg in sodium chloride 0.9 % IVPB        10 mg/kg  71.8 kg 128 mL/hr over 30 Minutes Intravenous Every 48 hours 09/09/22 0816     09/08/22 1600  DAPTOmycin (CUBICIN) 550 mg in sodium chloride 0.9 % IVPB  Status:  Discontinued        8 mg/kg  70.6 kg 122 mL/hr over 30 Minutes Intravenous Every 48 hours 09/08/22 1501 09/09/22 0816   09/06/22 1400  vancomycin (VANCOCIN) 50 mg/mL oral solution SOLN 125 mg        125 mg Oral Every 6 hours 09/06/22 1304 09/20/22 1159   09/06/22 1115  piperacillin-tazobactam (ZOSYN) IVPB 3.375 g  Status:  Discontinued        3.375 g 12.5 mL/hr over 240 Minutes Intravenous Every 8 hours 09/06/22 1017 09/08/22 1406   08/26/22 1800   ceFEPIme (MAXIPIME) 2 g in sodium chloride 0.9 % 100 mL IVPB        2 g 200 mL/hr over 30 Minutes Intravenous Every 8 hours 08/26/22 1133 09/02/22 1132   08/26/22 1000  ceFEPIme (MAXIPIME) 2 g in sodium chloride 0.9 % 100 mL IVPB        2 g 200 mL/hr over 30 Minutes Intravenous  Once 08/26/22 0953 08/26/22 1051   08/26/22 1000  metroNIDAZOLE (FLAGYL) IVPB 500 mg        500 mg 100 mL/hr over 60 Minutes Intravenous  Once 08/26/22 0953 08/26/22 1213         Subjective:  Patient seen and examined.  He tells me that he has moderate ongoing abdominal pain but denies any nausea or vomiting.  Tube feeding was on hold, will resume.  His stool is more formed now.  Abdomen is distended.  Objective: Vitals:   09/09/22 0035 09/09/22 0421 09/09/22 0731 09/09/22 1129  BP: 124/85 134/88 (!) 141/90 134/88  Pulse: 98 (!) 102 (!) 103 (!) 107  Resp: 16 20  18   Temp: 98.1 F (36.7 C) 98.2 F (36.8 C) 98.6 F (37 C) 99 F (37.2 C)  TempSrc: Oral Oral Oral Oral  SpO2: 97% 98%    Weight:  71.8 kg    Height:        Intake/Output Summary (Last 24 hours) at 09/09/2022 1406 Last data filed at 09/09/2022 1103 Gross per 24 hour  Intake 881.13 ml  Output 550 ml  Net 331.13 ml  Filed Weights   09/07/22 0451 09/08/22 0410 09/09/22 0421  Weight: 71.2 kg 70.6 kg 71.8 kg    Examination:  General: Sick looking.  Frail debilitated. Cardiovascular: S1-S2 normal.  Regular rate rhythm.  Tachycardic. Respiratory: Bilateral clear.  No added sounds.  On room air. Gastrointestinal: Soft.  Mild generalized tenderness, mildly distended.  Bowel sound present. Ext: No swelling or edema. Neuro: Alert and awake.  Oriented.  Moves all extremities.  Data Reviewed: I have personally reviewed following labs and imaging studies  CBC: Recent Labs  Lab 09/05/22 2217 09/06/22 0308 09/07/22 0231 09/08/22 0515 09/09/22 0435  WBC 24.2* 43.8* 31.9* 24.8* 20.4*  NEUTROABS 21.2*  --   --   --   --   HGB 11.6* 9.7*  7.5* 7.5* 7.2*  HCT 35.9* 30.2* 22.2* 22.4* 22.1*  MCV 87.3 89.9 84.4 84.2 86.7  PLT 1,107* 994* 739* 739* 717*   Basic Metabolic Panel: Recent Labs  Lab 09/04/22 0314 09/05/22 1026 09/05/22 2217 09/06/22 0308 09/07/22 0231 09/07/22 1425 09/08/22 0515 09/08/22 0922 09/08/22 1636 09/09/22 0435  NA 132* 133* 134* 135 130* 131*  --  131* 132* 132*  K 4.7 4.9 5.4* 5.4* 5.8* 5.3*  --  5.4* 5.0 4.5  CL 104 102 97* 100 98 97*  --  95* 93* 97*  CO2 22 26 21* 20* 24 25  --  25 23 24   GLUCOSE 145* 138* 216* 167* 105* 114*  --  98 114* 91  BUN 15 13 17  22* 32* 32*  --  31* 32* 33*  CREATININE 1.02 1.21 1.80* 2.19* 2.91* 3.04*  --  3.37* 3.35* 3.52*  CALCIUM 8.1* 8.0* 8.6* 8.5* 8.1* 8.2*  --  8.4* 8.5* 8.3*  MG 1.8 1.6* 1.6* 2.3 2.3  --  2.5*  --   --  2.2  PHOS 3.4 2.9  --  3.3 4.2  --   --   --   --  5.1*   GFR: Estimated Creatinine Clearance: 22.9 mL/min (A) (by C-G formula based on SCr of 3.52 mg/dL (H)). Liver Function Tests: Recent Labs  Lab 09/05/22 1026 09/05/22 2217 09/06/22 0308 09/07/22 0231 09/08/22 0922 09/09/22 0435  AST 27 30 25 29 25   --   ALT 15 19 17 15 14   --   ALKPHOS 65 80 71 64 85  --   BILITOT 0.5 0.8 0.7 0.5 0.8  --   PROT 7.0 8.4* 7.6 7.0 7.6  --   ALBUMIN 1.9* 2.2* 2.0* 2.0* 2.4* 2.9*   No results for input(s): "LIPASE", "AMYLASE" in the last 168 hours. No results for input(s): "AMMONIA" in the last 168 hours. Coagulation Profile: No results for input(s): "INR", "PROTIME" in the last 168 hours. Cardiac Enzymes: Recent Labs  Lab 09/08/22 1636  CKTOTAL 69   BNP (last 3 results) No results for input(s): "PROBNP" in the last 8760 hours. HbA1C: No results for input(s): "HGBA1C" in the last 72 hours. CBG: Recent Labs  Lab 09/08/22 2143 09/09/22 0048 09/09/22 0425 09/09/22 0735 09/09/22 1135  GLUCAP 129* 92 94 83 81   Lipid Profile: No results for input(s): "CHOL", "HDL", "LDLCALC", "TRIG", "CHOLHDL", "LDLDIRECT" in the last 72  hours. Thyroid Function Tests: No results for input(s): "TSH", "T4TOTAL", "FREET4", "T3FREE", "THYROIDAB" in the last 72 hours. Anemia Panel: No results for input(s): "VITAMINB12", "FOLATE", "FERRITIN", "TIBC", "IRON", "RETICCTPCT" in the last 72 hours. Sepsis Labs: No results for input(s): "PROCALCITON", "LATICACIDVEN" in the last 168 hours.  Recent Results (from the  past 240 hour(s))  Gram stain     Status: None   Collection Time: 09/02/22  2:47 PM   Specimen: Abdomen; Peritoneal Fluid  Result Value Ref Range Status   Specimen Description ABDOMEN  Final   Special Requests NONE  Final   Gram Stain   Final    FEW WBC SEEN NO ORGANISMS SEEN Performed at [facility], [address] [place], [place] [address]    Report Status 09/02/2022 FINAL  Final  Culture, body fluid w Gram Stain-bottle     Status: Abnormal   Collection Time: 09/06/22  9:18 AM   Specimen: Peritoneal Washings  Result Value Ref Range Status   Specimen Description PERITONEAL  Final   Special Requests NONE  Final   Gram Stain   Final    GRAM POSITIVE COCCI IN CHAINS IN BOTH AEROBIC AND ANAEROBIC BOTTLES CRITICAL RESULT CALLED TO, READ BACK BY AND VERIFIED WITH: RN YOKO IMAI  ON 09/06/22 @ 1738 BY DRT Performed at [facility], [address], [place], [place] [address]    Culture (A)  Final    ENTEROCOCCUS FAECIUM VANCOMYCIN RESISTANT ENTEROCOCCUS    Report Status 09/08/2022 FINAL  Final   Organism ID, Bacteria ENTEROCOCCUS FAECIUM  Final      Susceptibility   Enterococcus faecium - MIC*    AMPICILLIN >=32 RESISTANT Resistant     VANCOMYCIN >=32 RESISTANT Resistant     GENTAMICIN SYNERGY SENSITIVE Sensitive     * ENTEROCOCCUS FAECIUM  Gram stain     Status: None   Collection Time: 09/06/22  9:18 AM   Specimen: Peritoneal Washings  Result Value Ref Range Status   Specimen Description PERITONEAL  Final   Special Requests NONE  Final   Gram Stain   Final    ABUNDANT WBC PRESENT, PREDOMINANTLY  PMN NO ORGANISMS SEEN Performed at [facility], [address], [place], [place] [address]    Report Status 09/06/2022 FINAL  Final  C Difficile Quick Screen (NO PCR Reflex)     Status: Abnormal   Collection Time: 09/06/22 10:47 AM   Specimen: STOOL  Result Value Ref Range Status   C Diff antigen POSITIVE (A) NEGATIVE Final   C Diff toxin POSITIVE (A) NEGATIVE Final    Comment: CRITICAL RESULT CALLED TO, READ BACK BY AND VERIFIED WITH:  09/06/2022 1047 RN ROOSEVELT    C Diff interpretation Toxin producing C. difficile detected.  Final    Comment: Performed at [facility], [address], [place], [place] [address]     Radiology Studies: US RENAL  Result Date: 09/08/2022 CLINICAL DATA:  Acute kidney injury, decompensated cirrhosis EXAM: RENAL / URINARY TRACT ULTRASOUND COMPLETE COMPARISON:  None Available. FINDINGS: Right Kidney: Renal measurements: 10.8 x 6.0 x 6.0 cm = volume: 195 mL. Echogenicity within normal limits. The lower pole the right kidney is obscured by overlying bowel gas. No mass or hydronephrosis visualized. Left Kidney: Renal measurements: 12.8 x 6.8 x 6.0 cm = volume: 222 mL. Echogenicity within normal limits. The upper pole of the left kidney is obscured by overlying bowel gas and osseous structures. No mass or hydronephrosis visualized. Bladder: Appears normal for degree of bladder distention. Other: Mild perihepatic ascites is present. IMPRESSION: 1. No hydronephrosis. 2. Mild ascites. Electronically Signed   By: Helyn Numbers M.D.   On: 09/08/2022 22:05    Scheduled Meds:  acetaminophen  650 mg Oral Q6H   acidophilus  2 capsule Oral  TID   enoxaparin (LOVENOX) injection  30 mg Subcutaneous Q24H   feeding supplement  237 mL Oral BID BM   feeding supplement (PROSource TF20)  60 mL Per Tube Daily   folic acid  1 mg Oral Daily   [START ON 09/10/2022] gabapentin  300 mg Oral QHS   lidocaine  1 patch Transdermal Q24H   lipase/protease/amylase  12,000 Units  Oral TID AC   multivitamin with minerals  1 tablet Oral Daily   octreotide  200 mcg Subcutaneous Q12H   pantoprazole  40 mg Oral BID AC   sodium chloride flush  10-40 mL Intracatheter Q12H   thiamine  100 mg Oral Daily   vancomycin  125 mg Oral Q6H   Continuous Infusions:  [START ON 09/10/2022] DAPTOmycin (CUBICIN) 700 mg in sodium chloride 0.9 % IVPB     feeding supplement (VITAL 1.5 CAL) 1,000 mL (09/09/22 1333)     LOS: 13 days   Dorcas Carrow, MD [facility]  09/09/2022, 2:06 PM

## 2022-09-09 NOTE — Progress Notes (Signed)
PHARMACY NOTE:  ANTIMICROBIAL RENAL DOSAGE ADJUSTMENT  Current antimicrobial regimen includes a mismatch between antimicrobial dosage and estimated renal function.  As per policy approved by the Pharmacy & Therapeutics and Medical Executive Committees, the antimicrobial dosage will be adjusted accordingly.  Current antimicrobial dosage: daptomycin 550mg  IV Q48H  Indication: VRE peritonitis   Renal Function:  Estimated Creatinine Clearance: 22.9 mL/min (A) (by C-G formula based on SCr of 3.52 mg/dL (H)). []      On intermittent HD, scheduled: []      On CRRT    Antimicrobial dosage has been changed to:  Daptomycin 700mg  IV Q48H  Additional comments: Increasing daptomycin to 10 mg/kg/day for VRE.  Thank you for allowing pharmacy to be a part of this patient's care.  Lendon Ka, PharmD Candidate 09/09/2022 8:16 AM

## 2022-09-10 DIAGNOSIS — R7989 Other specified abnormal findings of blood chemistry: Secondary | ICD-10-CM

## 2022-09-10 DIAGNOSIS — K659 Peritonitis, unspecified: Secondary | ICD-10-CM

## 2022-09-10 DIAGNOSIS — E871 Hypo-osmolality and hyponatremia: Secondary | ICD-10-CM | POA: Diagnosis not present

## 2022-09-10 DIAGNOSIS — A0472 Enterocolitis due to Clostridium difficile, not specified as recurrent: Secondary | ICD-10-CM | POA: Diagnosis not present

## 2022-09-10 DIAGNOSIS — K859 Acute pancreatitis without necrosis or infection, unspecified: Secondary | ICD-10-CM | POA: Diagnosis not present

## 2022-09-10 DIAGNOSIS — K709 Alcoholic liver disease, unspecified: Secondary | ICD-10-CM | POA: Diagnosis not present

## 2022-09-10 DIAGNOSIS — R188 Other ascites: Secondary | ICD-10-CM | POA: Diagnosis not present

## 2022-09-10 DIAGNOSIS — N179 Acute kidney failure, unspecified: Secondary | ICD-10-CM | POA: Diagnosis not present

## 2022-09-10 DIAGNOSIS — K852 Alcohol induced acute pancreatitis without necrosis or infection: Secondary | ICD-10-CM | POA: Diagnosis not present

## 2022-09-10 DIAGNOSIS — K861 Other chronic pancreatitis: Secondary | ICD-10-CM | POA: Diagnosis not present

## 2022-09-10 LAB — GLUCOSE, CAPILLARY
Glucose-Capillary: 116 mg/dL — ABNORMAL HIGH (ref 70–99)
Glucose-Capillary: 118 mg/dL — ABNORMAL HIGH (ref 70–99)
Glucose-Capillary: 121 mg/dL — ABNORMAL HIGH (ref 70–99)
Glucose-Capillary: 127 mg/dL — ABNORMAL HIGH (ref 70–99)
Glucose-Capillary: 128 mg/dL — ABNORMAL HIGH (ref 70–99)
Glucose-Capillary: 143 mg/dL — ABNORMAL HIGH (ref 70–99)
Glucose-Capillary: 154 mg/dL — ABNORMAL HIGH (ref 70–99)

## 2022-09-10 LAB — COMPREHENSIVE METABOLIC PANEL
ALT: 18 U/L (ref 0–44)
AST: 28 U/L (ref 15–41)
Albumin: 2.7 g/dL — ABNORMAL LOW (ref 3.5–5.0)
Alkaline Phosphatase: 132 U/L — ABNORMAL HIGH (ref 38–126)
Anion gap: 11 (ref 5–15)
BUN: 30 mg/dL — ABNORMAL HIGH (ref 6–20)
CO2: 21 mmol/L — ABNORMAL LOW (ref 22–32)
Calcium: 8.2 mg/dL — ABNORMAL LOW (ref 8.9–10.3)
Chloride: 100 mmol/L (ref 98–111)
Creatinine, Ser: 3.07 mg/dL — ABNORMAL HIGH (ref 0.61–1.24)
GFR, Estimated: 23 mL/min — ABNORMAL LOW (ref >60)
Glucose, Bld: 125 mg/dL — ABNORMAL HIGH (ref 70–99)
Potassium: 3.9 mmol/L (ref 3.5–5.1)
Sodium: 132 mmol/L — ABNORMAL LOW (ref 135–145)
Total Bilirubin: 0.5 mg/dL (ref 0.3–1.2)
Total Protein: 7.4 g/dL (ref 6.5–8.1)

## 2022-09-10 LAB — CBC WITH DIFFERENTIAL/PLATELET
Abs Immature Granulocytes: 0.22 10*3/uL — ABNORMAL HIGH (ref 0.00–0.07)
Basophils Absolute: 0.1 10*3/uL (ref 0.0–0.1)
Basophils Relative: 0 %
Eosinophils Absolute: 0.2 10*3/uL (ref 0.0–0.5)
Eosinophils Relative: 1 %
HCT: 22.3 % — ABNORMAL LOW (ref 39.0–52.0)
Hemoglobin: 7.3 g/dL — ABNORMAL LOW (ref 13.0–17.0)
Immature Granulocytes: 1 %
Lymphocytes Relative: 7 %
Lymphs Abs: 1.3 10*3/uL (ref 0.7–4.0)
MCH: 28.3 pg (ref 26.0–34.0)
MCHC: 32.7 g/dL (ref 30.0–36.0)
MCV: 86.4 fL (ref 80.0–100.0)
Monocytes Absolute: 1.5 10*3/uL — ABNORMAL HIGH (ref 0.1–1.0)
Monocytes Relative: 8 %
Neutro Abs: 14.9 10*3/uL — ABNORMAL HIGH (ref 1.7–7.7)
Neutrophils Relative %: 83 %
Platelets: 732 10*3/uL — ABNORMAL HIGH (ref 150–400)
RBC: 2.58 MIL/uL — ABNORMAL LOW (ref 4.22–5.81)
RDW: 21 % — ABNORMAL HIGH (ref 11.5–15.5)
WBC: 18.1 10*3/uL — ABNORMAL HIGH (ref 4.0–10.5)
nRBC: 0 % (ref 0.0–0.2)

## 2022-09-10 LAB — MAGNESIUM: Magnesium: 2.3 mg/dL (ref 1.7–2.4)

## 2022-09-10 LAB — LIPASE, FLUID: Lipase-Fluid: >60600 U/L

## 2022-09-10 LAB — PATHOLOGIST SMEAR REVIEW

## 2022-09-10 LAB — PHOSPHORUS: Phosphorus: 4.4 mg/dL (ref 2.5–4.6)

## 2022-09-10 MED ORDER — ACETAMINOPHEN 325 MG PO TABS
650.0000 mg | ORAL_TABLET | Freq: Four times a day (QID) | ORAL | Status: DC | PRN
  Administered 2022-09-10 – 2022-09-18 (×11): 650 mg via ORAL
  Filled 2022-09-10 (×11): qty 2

## 2022-09-10 MED ORDER — OCTREOTIDE ACETATE 100 MCG/ML IJ SOLN
200.0000 ug | Freq: Two times a day (BID) | INTRAMUSCULAR | Status: DC
  Administered 2022-09-11 – 2022-09-18 (×14): 200 ug via SUBCUTANEOUS
  Filled 2022-09-10 (×16): qty 2

## 2022-09-10 NOTE — Progress Notes (Signed)
PROGRESS NOTE    Samuel Blevins  ZDG:[id number] DOB: 1965-11-06 DOA: 08/26/2022 PCP: [name], MD   Brief Narrative:  57 year old with history of recurrent pancreatitis, chronic pancreatic insufficiency, HTN , recurrent alcohol use comes to the hospital with complaints of abdominal pain, nausea, vomiting and diarrhea.  Patient was also found to have acute recurrent alcoholic pancreatitis.  Initially patient had left AMA about 4-5 days prior to this admission.  Patient readmitted for acute recurrent alcoholic pancreatitis with worsening ascites, initial labs of ascites fluid indicate elevated amylase concerning for pancreatic duct disruption.  Hospital course complicated with presence of recurrent ascites, infected ascites fluid, ongoing abdominal pain and acute renal failure.  Followed by GI and nephrology.  Now with infectious disease.  Some clinical improvement now.  Assessment & Plan:   Principal Problem:   Sepsis (HCC) Active Problems:   Alcohol dependence syndrome (HCC)   Alcoholic pancreatitis   Acute on chronic pancreatitis (HCC)   Pancreatitis   Acute pancreatitis   Other ascites   Protein-calorie malnutrition, severe   Alcoholic liver disease (HCC)   Pancreatic ascites   Anasarca   SIRS (systemic inflammatory response syndrome) (HCC)   Infected ascites   Enteritis due to Clostridium difficile  Decompensated liver cirrhosis with ascites Sepsis secondary to SBP versus infected pancreatic ascites Hypoalbuminemia Hypovolemic hyponatremia -Sepsis evidenced by tachycardia and leukocytosis.  Source presumed pancreatic duct disruption(elevated amylase on paracentesis) and presumed SBP. -Paracentesis 7/1 - 3L; 7/5 - 3L; 7/8 2L, 7/12, 1.7 L. Echo: EF 60-65% with normal LVF and grade 1 diastolic dysfunction -Possible need for ERCP/pancreatic duct stenting per GI given questionable pancreatic duct disruption (amylase in ascites) -7/8 cortrak placed in distal duodenum per  GI recs , now eating full liquid diet without pain. -Hold tube feeding, advance to soft diet. -Blood culture, paracentesis fluid culture remain negative in the beginning but repeat paracentesis fluid on 09/06/2022 gross gram-positive cocci that is identified as VRE.  Now remains on daptomycin.  Followed by ID. -Urine culture shows multiple species, likely contaminant -Remains on octreotide. Poor prognosis.  C. difficile colitis: Patient also started having diarrhea on 09/06/2022, stool was tested positive for C. difficile, started on p.o. vancomycin by GI.  Diarrhea improved.  Continue vancomycin until he is on antibiotics and will need further as recommended by ID.  Acute hypoxic respiratory failure: Overnight on the night of 09/05/2022, patient went into respiratory distress, tachycardia and tachypnea and moved to progressive unit.  Chest x-ray was done which ruled out acute pathology but he did have some atelectasis, likely secondary to enlarging ascites.  Patient felt better after paracentesis.  Currently he is stable and comfortable on room air.  Recurrent alcohol induced pancreatitis. Nausea vomiting, resolved -Secondary to chronic alcohol use/abuse p.o. intake as tolerated, pain control avoiding IV narcotics, antiemetics, PPI   Severe protein caloric malnutrition High risk for refeeding syndrome -Complicated by chronic alcohol use -Core track placed 7/5 -dysfunctional 7/6 -replaced 09/02/2022, noted to be distal to the proximal duodenum. -Continue to follow electrolytes, mag/Phos.  Repleted as appropriate -Taking oral nutrition without excessive pain.  Discontinue tube feeding today and advance to soft diet.  If eating adequate, discontinue core track tomorrow.   Alcohol abuse - Alcohol withdrawal protocol completed, currently outside the window for any further withdrawal symptoms.  No further need for benzos IV versus p.o.    Essential hypertension -Currently well-controlled off  medications, as needed hydralazine for hypertensive events   GERD/duodenitis -Continue PPI  Acute  kidney injury with hyperkalemia: Followed by nephrology.  Treated with Lokelma.  Urine output is adequate.  Slight improvement today.  Anemia of chronic disease: Hemoglobin dropping gradually, 7.3 today.  May benefit with iron transfusion, will defer to nephrology.  DVT prophylaxis: enoxaparin (LOVENOX) injection 30 mg Start: 09/07/22 1700   Code Status: Full Code  Family Communication:  None present at bedside.  Plan of care discussed with patient in length and he/she verbalized understanding and agreed with it.  Status is: Inpatient Remains inpatient appropriate because: Needs ERCP, timing to be determined by GI.  GI may also transfer him to [facility].  Now on IV antibiotics.  Will need IV antibiotic plans.   Estimated body mass index is 23.05 kg/m as calculated from the following:   Height as of this encounter: 5\' 9"  (1.753 m).   Weight as of this encounter: 70.8 kg.    Nutritional Assessment: Body mass index is 23.05 kg/m.Marland Kitchen Seen by dietician.  I agree with the assessment and plan as outlined below: Nutrition Status: Nutrition Problem: Severe Malnutrition Etiology: chronic illness (pancreatitis) Signs/Symptoms: severe fat depletion, severe muscle depletion, moderate fat depletion, moderate muscle depletion Interventions: Refer to RD note for recommendations  . Skin Assessment: I have examined the patient's skin and I agree with the wound assessment as performed by the wound care RN as outlined below:    Consultants:  GI Nephrology Infectious disease  Procedures:  As above  Antimicrobials:  Anti-infectives (From admission, onward)    Start     Dose/Rate Route Frequency Ordered Stop   09/10/22 1600  DAPTOmycin (CUBICIN) 700 mg in sodium chloride 0.9 % IVPB        10 mg/kg  71.8 kg 128 mL/hr over 30 Minutes Intravenous Every 48 hours 09/09/22 0816     09/08/22 1600   DAPTOmycin (CUBICIN) 550 mg in sodium chloride 0.9 % IVPB  Status:  Discontinued        8 mg/kg  70.6 kg 122 mL/hr over 30 Minutes Intravenous Every 48 hours 09/08/22 1501 09/09/22 0816   09/06/22 1400  vancomycin (VANCOCIN) 50 mg/mL oral solution SOLN 125 mg        125 mg Oral Every 6 hours 09/06/22 1304 09/20/22 1159   09/06/22 1115  piperacillin-tazobactam (ZOSYN) IVPB 3.375 g  Status:  Discontinued        3.375 g 12.5 mL/hr over 240 Minutes Intravenous Every 8 hours 09/06/22 1017 09/08/22 1406   08/26/22 1800  ceFEPIme (MAXIPIME) 2 g in sodium chloride 0.9 % 100 mL IVPB        2 g 200 mL/hr over 30 Minutes Intravenous Every 8 hours 08/26/22 1133 09/02/22 1132   08/26/22 1000  ceFEPIme (MAXIPIME) 2 g in sodium chloride 0.9 % 100 mL IVPB        2 g 200 mL/hr over 30 Minutes Intravenous  Once 08/26/22 0953 08/26/22 1051   08/26/22 1000  metroNIDAZOLE (FLAGYL) IVPB 500 mg        500 mg 100 mL/hr over 60 Minutes Intravenous  Once 08/26/22 0953 08/26/22 1213         Subjective:  Patient seen and examined early morning rounds.  No overnight events.  Patient tells me that he has been eating and drinking well.  Denies any excessive abdominal pain.  Belly slightly bloated and he understands that we will not do paracentesis to avoid hurting his kidneys.  No more diarrhea.  Patient tells me that he has well-formed stools now.  Objective:  Vitals:   09/10/22 0500 09/10/22 0600 09/10/22 0900 09/10/22 1156  BP: 135/85  (!) 141/91 (!) 132/91  Pulse: (!) 110 (!) 105 (!) 106 (!) 104  Resp: 17 14 20  (!) 22  Temp:    98.3 F (36.8 C)  TempSrc:    Oral  SpO2: 96% 99% 94% 99%  Weight:      Height:        Intake/Output Summary (Last 24 hours) at 09/10/2022 1240 Last data filed at 09/10/2022 1130 Gross per 24 hour  Intake 1360 ml  Output 1120 ml  Net 240 ml   Filed Weights   09/08/22 0410 09/09/22 0421 09/10/22 0429  Weight: 70.6 kg 71.8 kg 70.8 kg    Examination:  General: Sick  looking.  Frail debilitated.  Walking around in the room. Cardiovascular: S1-S2 normal.  Regular rate rhythm.   Respiratory: Bilateral clear.  No added sounds.  On room air. Gastrointestinal: Soft.  Mild generalized tenderness, mildly distended.  Bowel sound present. Ext: No swelling or edema. Neuro: Alert and awake.  Oriented.  Moves all extremities.  Data Reviewed: I have personally reviewed following labs and imaging studies  CBC: Recent Labs  Lab 09/05/22 2217 09/06/22 0308 09/07/22 0231 09/08/22 0515 09/09/22 0435 09/10/22 0017  WBC 24.2* 43.8* 31.9* 24.8* 20.4* 18.1*  NEUTROABS 21.2*  --   --   --   --  14.9*  HGB 11.6* 9.7* 7.5* 7.5* 7.2* 7.3*  HCT 35.9* 30.2* 22.2* 22.4* 22.1* 22.3*  MCV 87.3 89.9 84.4 84.2 86.7 86.4  PLT 1,107* 994* 739* 739* 717* 732*   Basic Metabolic Panel: Recent Labs  Lab 09/05/22 1026 09/05/22 2217 09/06/22 0308 09/07/22 0231 09/07/22 1425 09/08/22 0515 09/08/22 0922 09/08/22 1636 09/09/22 0435 09/10/22 0017  NA 133*   < > 135 130* 131*  --  131* 132* 132* 132*  K 4.9   < > 5.4* 5.8* 5.3*  --  5.4* 5.0 4.5 3.9  CL 102   < > 100 98 97*  --  95* 93* 97* 100  CO2 26   < > 20* 24 25  --  25 23 24  21*  GLUCOSE 138*   < > 167* 105* 114*  --  98 114* 91 125*  BUN 13   < > 22* 32* 32*  --  31* 32* 33* 30*  CREATININE 1.21   < > 2.19* 2.91* 3.04*  --  3.37* 3.35* 3.52* 3.07*  CALCIUM 8.0*   < > 8.5* 8.1* 8.2*  --  8.4* 8.5* 8.3* 8.2*  MG 1.6*   < > 2.3 2.3  --  2.5*  --   --  2.2 2.3  PHOS 2.9  --  3.3 4.2  --   --   --   --  5.1* 4.4   < > = values in this interval not displayed.   GFR: Estimated Creatinine Clearance: 26.2 mL/min (A) (by C-G formula based on SCr of 3.07 mg/dL (H)). Liver Function Tests: Recent Labs  Lab 09/05/22 2217 09/06/22 0308 09/07/22 0231 09/08/22 0922 09/09/22 0435 09/10/22 0017  AST 30 25 29 25   --  28  ALT 19 17 15 14   --  18  ALKPHOS 80 71 64 85  --  132*  BILITOT 0.8 0.7 0.5 0.8  --  0.5  PROT 8.4*  7.6 7.0 7.6  --  7.4  ALBUMIN 2.2* 2.0* 2.0* 2.4* 2.9* 2.7*   No results for input(s): "LIPASE", "AMYLASE" in the last  168 hours. No results for input(s): "AMMONIA" in the last 168 hours. Coagulation Profile: No results for input(s): "INR", "PROTIME" in the last 168 hours. Cardiac Enzymes: Recent Labs  Lab 09/08/22 1636  CKTOTAL 69   BNP (last 3 results) No results for input(s): "PROBNP" in the last 8760 hours. HbA1C: No results for input(s): "HGBA1C" in the last 72 hours. CBG: Recent Labs  Lab 09/09/22 2056 09/10/22 0020 09/10/22 0428 09/10/22 0802 09/10/22 1155  GLUCAP 118* 128* 154* 143* 121*   Lipid Profile: No results for input(s): "CHOL", "HDL", "LDLCALC", "TRIG", "CHOLHDL", "LDLDIRECT" in the last 72 hours. Thyroid Function Tests: No results for input(s): "TSH", "T4TOTAL", "FREET4", "T3FREE", "THYROIDAB" in the last 72 hours. Anemia Panel: No results for input(s): "VITAMINB12", "FOLATE", "FERRITIN", "TIBC", "IRON", "RETICCTPCT" in the last 72 hours. Sepsis Labs: No results for input(s): "PROCALCITON", "LATICACIDVEN" in the last 168 hours.  Recent Results (from the past 240 hour(s))  Gram stain     Status: None   Collection Time: 09/02/22  2:47 PM   Specimen: Abdomen; Peritoneal Fluid  Result Value Ref Range Status   Specimen Description ABDOMEN  Final   Special Requests NONE  Final   Gram Stain   Final    FEW WBC SEEN NO ORGANISMS SEEN Performed at [facility], [address] [place], [place] [address]    Report Status 09/02/2022 FINAL  Final  Culture, body fluid w Gram Stain-bottle     Status: Abnormal   Collection Time: 09/06/22  9:18 AM   Specimen: Peritoneal Washings  Result Value Ref Range Status   Specimen Description PERITONEAL  Final   Special Requests NONE  Final   Gram Stain   Final    GRAM POSITIVE COCCI IN CHAINS IN BOTH AEROBIC AND ANAEROBIC BOTTLES CRITICAL RESULT CALLED TO, READ BACK BY AND VERIFIED WITH: RN YOKO IMAI  ON 09/06/22  @ 1738 BY DRT Performed at [facility], [address], [place], [place] [address]    Culture (A)  Final    ENTEROCOCCUS FAECIUM VANCOMYCIN RESISTANT ENTEROCOCCUS    Report Status 09/08/2022 FINAL  Final   Organism ID, Bacteria ENTEROCOCCUS FAECIUM  Final      Susceptibility   Enterococcus faecium - MIC*    AMPICILLIN >=32 RESISTANT Resistant     VANCOMYCIN >=32 RESISTANT Resistant     GENTAMICIN SYNERGY SENSITIVE Sensitive     * ENTEROCOCCUS FAECIUM  Gram stain     Status: None   Collection Time: 09/06/22  9:18 AM   Specimen: Peritoneal Washings  Result Value Ref Range Status   Specimen Description PERITONEAL  Final   Special Requests NONE  Final   Gram Stain   Final    ABUNDANT WBC PRESENT, PREDOMINANTLY PMN NO ORGANISMS SEEN Performed at [facility], [address] [place], [place] [address]    Report Status 09/06/2022 FINAL  Final  C Difficile Quick Screen (NO PCR Reflex)     Status: Abnormal   Collection Time: 09/06/22 10:47 AM   Specimen: STOOL  Result Value Ref Range Status   C Diff antigen POSITIVE (A) NEGATIVE Final   C Diff toxin POSITIVE (A) NEGATIVE Final    Comment: CRITICAL RESULT CALLED TO, READ BACK BY AND VERIFIED WITH:  09/06/2022 1047 RN ROOSEVELT    C Diff interpretation Toxin producing C. difficile detected.  Final    Comment: Performed at [facility], [address] [place], [place] [address]  Radiology Studies: US RENAL  Result Date: 09/08/2022 CLINICAL DATA:  Acute kidney injury, decompensated cirrhosis EXAM: RENAL / URINARY TRACT ULTRASOUND COMPLETE COMPARISON:  None Available. FINDINGS: Right Kidney: Renal measurements: 10.8 x 6.0 x 6.0 cm = volume: 195 mL. Echogenicity within normal limits. The lower pole the right kidney is obscured by overlying bowel gas. No mass or hydronephrosis visualized. Left Kidney: Renal measurements: 12.8 x 6.8 x 6.0 cm = volume: 222 mL. Echogenicity within normal limits. The upper pole of the  left kidney is obscured by overlying bowel gas and osseous structures. No mass or hydronephrosis visualized. Bladder: Appears normal for degree of bladder distention. Other: Mild perihepatic ascites is present. IMPRESSION: 1. No hydronephrosis. 2. Mild ascites. Electronically Signed   By: Helyn Numbers M.D.   On: 09/08/2022 22:05    Scheduled Meds:  acidophilus  2 capsule Oral TID   enoxaparin (LOVENOX) injection  30 mg Subcutaneous Q24H   feeding supplement  237 mL Oral BID BM   feeding supplement (PROSource TF20)  60 mL Per Tube Daily   folic acid  1 mg Oral Daily   gabapentin  300 mg Oral QHS   lidocaine  1 patch Transdermal Q24H   lipase/protease/amylase  12,000 Units Oral TID AC   multivitamin with minerals  1 tablet Oral Daily   octreotide  200 mcg Subcutaneous Q12H   pantoprazole  40 mg Oral BID AC   sodium chloride flush  10-40 mL Intracatheter Q12H   thiamine  100 mg Oral Daily   vancomycin  125 mg Oral Q6H   Continuous Infusions:  DAPTOmycin (CUBICIN) 700 mg in sodium chloride 0.9 % IVPB     feeding supplement (VITAL 1.5 CAL) 1,000 mL (09/10/22 0813)     LOS: 14 days   Total time spent: 35 minutes  Dorcas Carrow, MD [facility]  09/10/2022, 12:40 PM

## 2022-09-10 NOTE — Progress Notes (Signed)
   09/10/22 1537  Mobility  Activity Ambulated with assistance in hallway  Level of Assistance Standby assist, set-up cues, supervision of patient - no hands on  Assistive Device Four wheel walker  Distance Ambulated (ft) 80 ft  Activity Response Tolerated well  Mobility Referral Yes  $Mobility charge 1 Mobility  Mobility Specialist Start Time (ACUTE ONLY) 1510  Mobility Specialist Stop Time (ACUTE ONLY) 1530  Mobility Specialist Time Calculation (min) (ACUTE ONLY) 20 min   Mobility Specialist: Progress Note  Pt agreed to mobility session. Stand by assist to stand and during ambulation using rollator. No c/o, displayed unsteady gait throughout. After session, pt returned to bed with all needs met.   Barnie Mort Mobility Specialist Please contact via SecureChat or Rehab office at [phone]

## 2022-09-10 NOTE — Progress Notes (Addendum)
Daily Progress Note  DOA: 08/26/2022 Hospital Day: 16 Chief Complaint: chronic pancreatitis, pancreatic duct disruption, peritonitis   Attending physician's note   I have taken a history, reviewed the chart and examined the patient. I performed a substantive portion of this encounter, including complete performance of at least one of the key components, in conjunction with the APP. I agree with the APP's note, impression and recommendations.   Patient will need to be transferred to [facility] for further management of complicated chronic pancreatitis with pancreatic duct disruption, secondary bacterial peritonitis with VRE Continue daptomycin per ID recommendation Continue supportive care  The patient was provided an opportunity to ask questions and all were answered. The patient agreed with the plan and demonstrated an understanding of the instructions.   [name] , MD [phone]    Assessment and Plan:    Brief Narrative:  Samuel Blevins is a 57 y.o. year old male with chronic pancreatitis, etoh abuse, admitted with persistent / recurrent infected peritonitis. Previously, SAAG was not compatible with portal hypertension which now makes since as this is pancreatic ascites with large amount of amylase in ascitic fluid.  However he could still have cirrhosis as imaging this admission coincidentally does show a"nodular" contour of fatty liver. More recent imaging ( MRI) shows only hepatomegaly and steatosis    Chronic pancreatitis, now with infected pancreatic ascites  / pancreatic duct disruption.  -Continue Octreotide 200 mg SQ BID to decrease pancreatic secretions --Continue Cortrak (post-pyloric) feedings - VRE in peritoneal fluid culture.On Daptomycin.  ID following. WBC improving 31 K >> 24.8K >>  18K.   -Continue Creon 12K units TID - We are discussing case with our advanced biliary endoscopist Dr. Meridee Score but it sounds like he will not have any  availability for ERCP with stenting. Patient may need transfer to outside facility  AKI -Nephrology following -Renal US negative for acute findings.  -Cr improved overnight 3.5 >> 3.05   C-diff -Continue oral Vancomycin , on day # 5.  -No significant diarrhea   Subjective / New Events:   Sleepy. Doesn't want to talk. Sill has abdominal pain.    Objective:   Recent Labs    09/08/22 0515 09/09/22 0435 09/10/22 0017  WBC 24.8* 20.4* 18.1*  HGB 7.5* 7.2* 7.3*  HCT 22.4* 22.1* 22.3*  PLT 739* 717* 732*   BMET Recent Labs    09/08/22 1636 09/09/22 0435 09/10/22 0017  NA 132* 132* 132*  K 5.0 4.5 3.9  CL 93* 97* 100  CO2 23 24 21*  GLUCOSE 114* 91 125*  BUN 32* 33* 30*  CREATININE 3.35* 3.52* 3.07*  CALCIUM 8.5* 8.3* 8.2*   LFT Recent Labs    09/10/22 0017  PROT 7.4  ALBUMIN 2.7*  AST 28  ALT 18  ALKPHOS 132*  BILITOT 0.5   PT/INR No results for input(s): "LABPROT", "INR" in the last 72 hours.   Imaging:  US RENAL CLINICAL DATA:  Acute kidney injury, decompensated cirrhosis  EXAM: RENAL / URINARY TRACT ULTRASOUND COMPLETE  COMPARISON:  None Available.  FINDINGS: Right Kidney:  Renal measurements: 10.8 x 6.0 x 6.0 cm = volume: 195 mL. Echogenicity within normal limits. The lower pole the right kidney is obscured by overlying bowel gas. No mass or hydronephrosis visualized.  Left Kidney:  Renal measurements: 12.8 x 6.8 x 6.0 cm = volume: 222 mL. Echogenicity within normal limits. The upper pole of the left kidney is obscured by overlying bowel gas  and osseous structures. No mass or hydronephrosis visualized.  Bladder:  Appears normal for degree of bladder distention.  Other:  Mild perihepatic ascites is present.  IMPRESSION: 1. No hydronephrosis. 2. Mild ascites.  Electronically Signed   By: Helyn Numbers M.D.   On: 09/08/2022 22:05     Scheduled inpatient medications:   acidophilus  2 capsule Oral TID   enoxaparin  (LOVENOX) injection  30 mg Subcutaneous Q24H   feeding supplement  237 mL Oral BID BM   feeding supplement (PROSource TF20)  60 mL Per Tube Daily   folic acid  1 mg Oral Daily   gabapentin  300 mg Oral QHS   lidocaine  1 patch Transdermal Q24H   lipase/protease/amylase  12,000 Units Oral TID AC   multivitamin with minerals  1 tablet Oral Daily   octreotide  200 mcg Subcutaneous Q12H   pantoprazole  40 mg Oral BID AC   sodium chloride flush  10-40 mL Intracatheter Q12H   thiamine  100 mg Oral Daily   vancomycin  125 mg Oral Q6H   Continuous inpatient infusions:   DAPTOmycin (CUBICIN) 700 mg in sodium chloride 0.9 % IVPB     feeding supplement (VITAL 1.5 CAL) 1,000 mL (09/10/22 0813)   PRN inpatient medications: acetaminophen, guaiFENesin, hydrALAZINE, hydrOXYzine, ipratropium-albuterol, metoprolol tartrate, naLOXone (NARCAN)  injection, ondansetron (ZOFRAN) IV, oxyCODONE, senna-docusate, sodium chloride flush, traZODone  Vital signs in last 24 hours: Temp:  [97.8 F (36.6 C)-99 F (37.2 C)] 98.3 F (36.8 C) (07/16 1156) Pulse Rate:  [100-111] 104 (07/16 1156) Resp:  [14-22] 22 (07/16 1156) BP: (119-143)/(80-91) 132/91 (07/16 1156) SpO2:  [94 %-99 %] 99 % (07/16 1156) Weight:  [70.8 kg] 70.8 kg (07/16 0429) Last BM Date : 09/10/22  Intake/Output Summary (Last 24 hours) at 09/10/2022 1319 Last data filed at 09/10/2022 1130 Gross per 24 hour  Intake 1360 ml  Output 1120 ml  Net 240 ml    Intake/Output from previous day: 07/15 0701 - 07/16 0700 In: 1350 [P.O.:600; NG/GT:750] Out: 1300 [Urine:1300] Intake/Output this shift: Total I/O In: 10 [I.V.:10] Out: 120 [Urine:120]   Physical Exam:  General: Sleepy male in NAD Heart:  Regular rate and rhythm.  Pulmonary: Normal respiratory effort Abdomen: Soft, moderately distended, nontender. Normal bowel sounds. Neurologic: Alert and oriented Psych: Pleasant. Cooperative. Insight appears normal.    Principal Problem:    Sepsis (HCC) Active Problems:   Alcohol dependence syndrome (HCC)   Alcoholic pancreatitis   Acute on chronic pancreatitis (HCC)   Pancreatitis   Acute pancreatitis   Other ascites   Protein-calorie malnutrition, severe   Alcoholic liver disease (HCC)   Pancreatic ascites   Anasarca   SIRS (systemic inflammatory response syndrome) (HCC)   Infected ascites   Enteritis due to Clostridium difficile     LOS: 14 days   Willette Cluster ,NP 09/10/2022, 1:19 PM

## 2022-09-10 NOTE — Progress Notes (Signed)
[facility]   Reason for visit: Follow up on VRE peritonitis, C diff  Interval History: refuses to discuss his care.  WBC 18.1, remains afebrile.   Day 3 daptomycin  Physical Exam: Constitutional:  Vitals:   09/10/22 0600 09/10/22 0900  BP:  (!) 141/91  Pulse: (!) 105 (!) 106  Resp: 14 20  Temp:    SpO2: 99% 94%   patient appears in NAD HENT: + NG tube Respiratory: Normal respiratory effort  Review of Systems: refuses  Lab Results  Component Value Date   WBC 18.1 (H) 09/10/2022   HGB 7.3 (L) 09/10/2022   HCT 22.3 (L) 09/10/2022   MCV 86.4 09/10/2022   PLT 732 (H) 09/10/2022    Lab Results  Component Value Date   CREATININE 3.07 (H) 09/10/2022   BUN 30 (H) 09/10/2022   NA 132 (L) 09/10/2022   K 3.9 09/10/2022   CL 100 09/10/2022   CO2 21 (L) 09/10/2022    Lab Results  Component Value Date   ALT 18 09/10/2022   AST 28 09/10/2022   ALKPHOS 132 (H) 09/10/2022     Microbiology: Recent Results (from the past 240 hour(s))  Gram stain     Status: None   Collection Time: 09/02/22  2:47 PM   Specimen: Abdomen; Peritoneal Fluid  Result Value Ref Range Status   Specimen Description ABDOMEN  Final   Special Requests NONE  Final   Gram Stain   Final    FEW WBC SEEN NO ORGANISMS SEEN Performed at [facility], [address], [place], [place] [address]    Report Status 09/02/2022 FINAL  Final  Culture, body fluid w Gram Stain-bottle     Status: Abnormal   Collection Time: 09/06/22  9:18 AM   Specimen: Peritoneal Washings  Result Value Ref Range Status   Specimen Description PERITONEAL  Final   Special Requests NONE  Final   Gram Stain   Final    GRAM POSITIVE COCCI IN CHAINS IN BOTH AEROBIC AND ANAEROBIC BOTTLES CRITICAL RESULT CALLED TO, READ BACK BY AND VERIFIED WITH: RN YOKO IMAI  ON 09/06/22 @ 1738 BY DRT Performed at [facility], [address] [place], [place] [address]    Culture (A)  Final    ENTEROCOCCUS  FAECIUM VANCOMYCIN RESISTANT ENTEROCOCCUS    Report Status 09/08/2022 FINAL  Final   Organism ID, Bacteria ENTEROCOCCUS FAECIUM  Final      Susceptibility   Enterococcus faecium - MIC*    AMPICILLIN >=32 RESISTANT Resistant     VANCOMYCIN >=32 RESISTANT Resistant     GENTAMICIN SYNERGY SENSITIVE Sensitive     * ENTEROCOCCUS FAECIUM  Gram stain     Status: None   Collection Time: 09/06/22  9:18 AM   Specimen: Peritoneal Washings  Result Value Ref Range Status   Specimen Description PERITONEAL  Final   Special Requests NONE  Final   Gram Stain   Final    ABUNDANT WBC PRESENT, PREDOMINANTLY PMN NO ORGANISMS SEEN Performed at [facility], [address] [place], [place] [address]    Report Status 09/06/2022 FINAL  Final  C Difficile Quick Screen (NO PCR Reflex)     Status: Abnormal   Collection Time: 09/06/22 10:47 AM   Specimen: STOOL  Result Value Ref Range Status   C Diff antigen POSITIVE (A) NEGATIVE Final   C Diff toxin POSITIVE (A) NEGATIVE Final    Comment: CRITICAL  RESULT CALLED TO, READ BACK BY AND VERIFIED WITH:  09/06/2022 1047 RN ROOSEVELT    C Diff interpretation Toxin producing C. difficile detected.  Final    Comment: Performed at [facility], [address], [place], [place] [address]    Impression/Plan:  1. Infected ascites - VRE in culture and is on daptomycin.  Periperhal WBC improving down to 18.1 today.   Will continue antibiotics.    2.  C diff colitis - diarrhea appears improved though patient not forthcoming on any changes.  On oral vancomycin and will continue.   Also on ensure and with pancreatic insufficiency, so difficult to measure improvement clinically.   Will continue with vancomycin  3.  Medication monitoring - will continue to monitor CK.  Has been wnl.

## 2022-09-10 NOTE — Progress Notes (Signed)
PT Cancellation Note  Patient Details Name: Samuel Blevins MRN: [id number] DOB: [birth year]   Cancelled Treatment:    Reason Eval/Treat Not Completed: Patient declined, no reason specified (pt reporting he has already walked in the hallawy 2x this morning. no documentation found in chart to confirm this. pt declined to mobilize despite encuragement. will follow up at later date/time as schedule allows.)   Renaldo Fiddler PT, DPT Acute Rehabilitation Services Office [phone]  09/10/22 11:22 AM

## 2022-09-10 NOTE — Progress Notes (Signed)
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; secondary peritonitis with VRI; CDI; AKI with normal baseline GFR; ETOH user  Subjective:  Engages a little bit more today No c/o SCr improved to 3.07, UOP 1.3L at least WBC improving, Hb stable  07/15 0701 - 07/16 0700 In: 1350 [P.O.:600; NG/GT:750] Out: 1300 [Urine:1300]  Filed Weights   09/08/22 0410 09/09/22 0421 09/10/22 0429  Weight: 70.6 kg 71.8 kg 70.8 kg    Scheduled Meds:  acidophilus  2 capsule Oral TID   enoxaparin (LOVENOX) injection  30 mg Subcutaneous Q24H   feeding supplement  237 mL Oral BID BM   feeding supplement (PROSource TF20)  60 mL Per Tube Daily   folic acid  1 mg Oral Daily   gabapentin  300 mg Oral QHS   lidocaine  1 patch Transdermal Q24H   lipase/protease/amylase  12,000 Units Oral TID AC   multivitamin with minerals  1 tablet Oral Daily   octreotide  200 mcg Subcutaneous Q12H   pantoprazole  40 mg Oral BID AC   sodium chloride flush  10-40 mL Intracatheter Q12H   thiamine  100 mg Oral Daily   vancomycin  125 mg Oral Q6H   Continuous Infusions:  DAPTOmycin (CUBICIN) 700 mg in sodium chloride 0.9 % IVPB     feeding supplement (VITAL 1.5 CAL) 1,000 mL (09/10/22 0813)   PRN Meds:.acetaminophen, guaiFENesin, hydrALAZINE, hydrOXYzine, ipratropium-albuterol, metoprolol tartrate, naLOXone (NARCAN)  injection, ondansetron (ZOFRAN) IV, oxyCODONE, senna-docusate, sodium chloride flush, traZODone  Current Labs: reviewed  09/08/22 FeNA 3.7%  Physical Exam:  Blood pressure (!) 132/91, pulse (!) 104, temperature 98.3 F (36.8 C), temperature source Oral, resp. rate (!) 22, height 5\' 9"  (1.753 m), weight 70.8 kg, SpO2 99%. NAD, awake Regular, S/nt CTAB  No sig LEE  A AKI normal GFR at baseline suspect ATN or AIN; appears to be slowly improving Neg renal US 7/14 FeNA 3.7% 7/14; UA no RBC but WBC present Not consistent with HRS nor is his  GI issue supportive of that Chronic pancreatitis and concern for pancreatic duct disruption, GI following likely to req [facility] eval VRE peritontitis, ABX change to dapto 7/14 with [facility] CDI on PO vanc, improving Alcohol abuse HTN, BPs stable  P Cont supportive care No indication for dialysis Change in ABX would be requisite step for ? AIN, no role for steroids No further recommendations. Will sign off for now.  Please call with any questions or concerns.  Pt does note need follow up with nephrology.  Outpt MDs can refer as needed  Medication Issues; Preferred narcotic agents for pain control are hydromorphone, fentanyl, and methadone. Morphine should not be used.  Baclofen should be avoided Avoid oral sodium phosphate and magnesium citrate based laxatives / bowel preps    Sabra Heck MD 09/10/2022, 2:01 PM  Recent Labs  Lab 09/07/22 0231 09/07/22 1425 09/08/22 1636 09/09/22 0435 09/10/22 0017  NA 130*   < > 132* 132* 132*  K 5.8*   < > 5.0 4.5 3.9  CL 98   < > 93* 97* 100  CO2 24   < > 23 24 21*  GLUCOSE 105*   < > 114* 91 125*  BUN 32*   < > 32* 33* 30*  CREATININE 2.91*   < > 3.35* 3.52* 3.07*  CALCIUM 8.1*   < > 8.5* 8.3* 8.2*  PHOS 4.2  --   --  5.1* 4.4   < > =  values in this interval not displayed.   Recent Labs  Lab 09/05/22 2217 09/06/22 0308 09/08/22 0515 09/09/22 0435 09/10/22 0017  WBC 24.2*   < > 24.8* 20.4* 18.1*  NEUTROABS 21.2*  --   --   --  14.9*  HGB 11.6*   < > 7.5* 7.2* 7.3*  HCT 35.9*   < > 22.4* 22.1* 22.3*  MCV 87.3   < > 84.2 86.7 86.4  PLT 1,107*   < > 739* 717* 732*   < > = values in this interval not displayed.

## 2022-09-11 DIAGNOSIS — A0472 Enterocolitis due to Clostridium difficile, not specified as recurrent: Secondary | ICD-10-CM | POA: Diagnosis not present

## 2022-09-11 DIAGNOSIS — D696 Thrombocytopenia, unspecified: Secondary | ICD-10-CM

## 2022-09-11 DIAGNOSIS — R6520 Severe sepsis without septic shock: Secondary | ICD-10-CM | POA: Diagnosis not present

## 2022-09-11 DIAGNOSIS — N179 Acute kidney failure, unspecified: Secondary | ICD-10-CM | POA: Diagnosis not present

## 2022-09-11 DIAGNOSIS — D649 Anemia, unspecified: Secondary | ICD-10-CM

## 2022-09-11 DIAGNOSIS — A419 Sepsis, unspecified organism: Secondary | ICD-10-CM | POA: Diagnosis not present

## 2022-09-11 DIAGNOSIS — K861 Other chronic pancreatitis: Secondary | ICD-10-CM | POA: Diagnosis not present

## 2022-09-11 DIAGNOSIS — E871 Hypo-osmolality and hyponatremia: Secondary | ICD-10-CM | POA: Diagnosis not present

## 2022-09-11 DIAGNOSIS — K659 Peritonitis, unspecified: Secondary | ICD-10-CM

## 2022-09-11 LAB — GLUCOSE, CAPILLARY
Glucose-Capillary: 114 mg/dL — ABNORMAL HIGH (ref 70–99)
Glucose-Capillary: 120 mg/dL — ABNORMAL HIGH (ref 70–99)
Glucose-Capillary: 123 mg/dL — ABNORMAL HIGH (ref 70–99)
Glucose-Capillary: 129 mg/dL — ABNORMAL HIGH (ref 70–99)

## 2022-09-11 LAB — COMPREHENSIVE METABOLIC PANEL
ALT: 31 U/L (ref 0–44)
AST: 45 U/L — ABNORMAL HIGH (ref 15–41)
Albumin: 2.6 g/dL — ABNORMAL LOW (ref 3.5–5.0)
Alkaline Phosphatase: 216 U/L — ABNORMAL HIGH (ref 38–126)
Anion gap: 8 (ref 5–15)
BUN: 26 mg/dL — ABNORMAL HIGH (ref 6–20)
CO2: 24 mmol/L (ref 22–32)
Calcium: 8.1 mg/dL — ABNORMAL LOW (ref 8.9–10.3)
Chloride: 101 mmol/L (ref 98–111)
Creatinine, Ser: 2.53 mg/dL — ABNORMAL HIGH (ref 0.61–1.24)
GFR, Estimated: 29 mL/min — ABNORMAL LOW (ref >60)
Glucose, Bld: 119 mg/dL — ABNORMAL HIGH (ref 70–99)
Potassium: 4.1 mmol/L (ref 3.5–5.1)
Sodium: 133 mmol/L — ABNORMAL LOW (ref 135–145)
Total Bilirubin: 0.7 mg/dL (ref 0.3–1.2)
Total Protein: 7.6 g/dL (ref 6.5–8.1)

## 2022-09-11 LAB — C DIFFICILE (CDIFF) QUICK SCRN (NO PCR REFLEX)
C Diff antigen: POSITIVE — AB
C Diff interpretation: DETECTED
C Diff toxin: POSITIVE — AB

## 2022-09-11 MED ORDER — SODIUM CHLORIDE 0.9 % IV SOLN
10.0000 mg/kg | Freq: Every day | INTRAVENOUS | Status: DC
  Administered 2022-09-11 – 2022-09-17 (×7): 700 mg via INTRAVENOUS
  Filled 2022-09-11 (×8): qty 14

## 2022-09-11 MED ORDER — BANATROL TF EN LIQD
60.0000 mL | Freq: Three times a day (TID) | ENTERAL | Status: DC
  Administered 2022-09-11 (×3): 60 mL
  Filled 2022-09-11 (×4): qty 60

## 2022-09-11 MED ORDER — ENOXAPARIN SODIUM 40 MG/0.4ML IJ SOSY
40.0000 mg | PREFILLED_SYRINGE | Freq: Every evening | INTRAMUSCULAR | Status: DC
  Administered 2022-09-11 – 2022-09-17 (×7): 40 mg via SUBCUTANEOUS
  Filled 2022-09-11 (×7): qty 0.4

## 2022-09-11 NOTE — Progress Notes (Signed)
OT Cancellation Note  Patient Details Name: Samuel Blevins MRN: [id number] DOB: Jun 28, 1965   Cancelled Treatment:    Reason Eval/Treat Not Completed: Patient declined, no reason specified (Patient adamantly refusing stating he already did therapy today but nursing states no one has been to see patient today.) Alfonse Flavors, OTA Acute Rehabilitation Services  Office [phone]  Dewain Penning 09/11/2022, 9:53 AM

## 2022-09-11 NOTE — Progress Notes (Signed)
Per night shift RN - tube feed stopped prior to shift change because of potential ERCP today.  Contacted endoscopy NP for clarification.

## 2022-09-11 NOTE — Progress Notes (Signed)
Nutrition Follow-up  DOCUMENTATION CODES:   Severe malnutrition in context of chronic illness  INTERVENTION:  - Restart Vital 1.5 at 55 mL/hour (1320 mL goal daily volume)  -Provide PROSource TF20 60 mL daily per tube -Provides: 2060 kcal, 109 grams of protein, 1003 mL H2O daily   - Continue Soft Diet.   NUTRITION DIAGNOSIS:   Severe Malnutrition related to chronic illness (pancreatitis) as evidenced by severe fat depletion, severe muscle depletion, moderate fat depletion, moderate muscle depletion.  GOAL:   Patient will meet greater than or equal to 90% of their needs  MONITOR:   PO intake, Supplement acceptance, Labs, Weight trends, TF tolerance, I & O's  REASON FOR ASSESSMENT:   Consult Enteral/tube feeding initiation and management  ASSESSMENT:   57 year old male with PMHx of EtOH abuse, chronic pancreatitis, chronic pancreatic insufficiency, HTN who is admitted with persistent/recurrent infected peritonitis now with infected pancreatic ascites likely from pancreatic duct disruption.  Meds reviewed: risaquad, banatrol TF TID, folic acid, creon (TID), MVI, octreotide, thiamine. Labs reviewed:  Na low, BUN/Creatinine high, alk phos elevated.   Pt with post-pyloric cortrak still in place. RN reached out this am to report that TF were turned off again. Tube feeds have been turned off and on for the past 7 days. Pt unable to meet his nutritional needs at this time. It appears per EMR, that tube feeds were stopped last night due to potential ERCP. RD recommends tube feeds be started back at goal rate. Pt was upgraded to a soft diet this am. Pt also continues with diarrhea. RD will add Banatrol TID. Pt also has octreotide ordered.   RN reports that the pt is requesting cyclic feeds. Due to TF not consistently running and poor PO intakes, RD recommends continuing tube feeds continuously for now. If PO intakes improve, will consider adjusting to nocturnal feeds.   Diet Order:    Diet Order             DIET SOFT Room service appropriate? Yes; Fluid consistency: Thin  Diet effective now                   EDUCATION NEEDS:   No education needs have been identified at this time  Skin:  Skin Assessment: Reviewed RN Assessment  Last BM:  7/16 - type 7  Height:   Ht Readings from Last 1 Encounters:  08/26/22 5\' 9"  (1.753 m)    Weight:   Wt Readings from Last 1 Encounters:  09/11/22 70.8 kg    Ideal Body Weight:  72.7 kg  BMI:  Body mass index is 23.05 kg/m.  Estimated Nutritional Needs:   Kcal:  2100-2300  Protein:  105-115 grams  Fluid:  2.1-2.3 L/day  Bethann Humble, RD, LDN, CNSC.

## 2022-09-11 NOTE — Progress Notes (Addendum)
[facility] Progress Note  CC:  Chronic pancreatitis, pancreatic duct disruption   Subjective: He ate bacon and grits for breakfast.  He did not want any lunch.  No nausea or vomiting.  He remains on tube feedings.  He passed 2 nonbloody loose bowel movements today.  He continues to have generalized abdominal pain which is somewhat worse today than yesterday.  He currently rates his generalized abdominal pain a 10.    Objective:  Vital signs in last 24 hours: Temp:  [98.4 F (36.9 C)-100.3 F (37.9 C)] 98.4 F (36.9 C) (07/17 0744) Pulse Rate:  [100-116] 100 (07/17 0744) Resp:  [19-27] 19 (07/17 0744) BP: (123-145)/(78-91) 128/80 (07/17 0744) SpO2:  [96 %-99 %] 99 % (07/17 0744) Weight:  [70.8 kg] 70.8 kg (07/17 0500) Last BM Date : 09/10/22 General: Chronically ill-appearing male in no acute distress. Heart: Regular rate and rhythm, no murmurs. Pulm: Sounds clear, decreased in the bases. Abdomen: Protuberant and distended. Generalized tenderness without rebound or guarding.  Extremities: No lower extremity edema. Neurologic:  Alert and  oriented x 4. Grossly normal neurologically. Psych:  Alert and cooperative. Normal mood and affect.  Intake/Output from previous day: 07/16 0701 - 07/17 0700 In: 84 [I.V.:20; IV Piggyback:64] Out: 120 [Urine:120] Intake/Output this shift: Total I/O In: 18.2 [NG/GT:18.2] Out: 300 [Urine:300]  Lab Results: Recent Labs    09/09/22 0435 09/10/22 0017  WBC 20.4* 18.1*  HGB 7.2* 7.3*  HCT 22.1* 22.3*  PLT 717* 732*   BMET Recent Labs    09/09/22 0435 09/10/22 0017 09/11/22 0014  NA 132* 132* 133*  K 4.5 3.9 4.1  CL 97* 100 101  CO2 24 21* 24  GLUCOSE 91 125* 119*  BUN 33* 30* 26*  CREATININE 3.52* 3.07* 2.53*  CALCIUM 8.3* 8.2* 8.1*   LFT Recent Labs    09/11/22 0014  PROT 7.6  ALBUMIN 2.6*  AST 45*  ALT 31  ALKPHOS 216*  BILITOT 0.7   PT/INR No results for input(s): "LABPROT", "INR" in the last  72 hours. Hepatitis Panel No results for input(s): "HEPBSAG", "HCVAB", "HEPAIGM", "HEPBIGM" in the last 72 hours.  No results found.  Assessment / Plan:  Brief Narrative:  Samuel Blevins is a 57 y.o. year old male with chronic pancreatitis, etoh abuse, admitted with persistent / recurrent infected peritonitis. Previously, SAAG was not compatible with portal hypertension which now makes since as this is pancreatic ascites with large amount of amylase in ascitic fluid.  However he could still have cirrhosis as imaging this admission coincidentally does show a"nodular" contour of fatty liver. More recent imaging (MRI) shows only hepatomegaly and steatosis    Chronic pancreatitis, now with infected pancreatic ascites/pancreatic duct disruption. S/P paracentesis 09/06/2022, 1.7 L of peritoneal fluid removed cell count consistent with peritonitis.  Peritoneal fluid cultures positive for Enterococcus faecium.  On Daptomycin.  -Patient will need to transfer to [facility] for ERCP and PD stent as our advanced biliary endoscopist does not having availability to perform these procedures at this time. -If there is no bed availability at any other facility, he will have to follow up as outpatient once he is stable to be discharged  -Continue Octreotide 200 mg SQ BID to decrease pancreatic secretions -Continue Cortrak (post-pyloric) feedings -ID following. WBC improving 31 K >> 24.8K >>  18K.   -Continue Creon 12K units TID -Contnur Pantoprazole 40mg  every day -CBC in a.m. -Continue tube feedings until patient tolerates increased  oral intake -Await further recommendations per Dr. Lavon Paganini  Elevated LFTs. Questionable cirrhosis. MRI showed hepatomegaly with hepatic steatosis. AST 45. Alk phos 216.    AKI. Cr 3.07 -> 2.53.  -Nephrology following -Renal US negative for acute findings.  -Cr improved overnight 3.5 >> 3.05   C-diff -Continue oral Vancomycin   Hyponatremia. NA+ 133.    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 I performed a substantive portion of this encounter, including complete performance of at least one of the key components, in conjunction with the APP. I agree with the APP's note, impression and recommendations.    Dr Sherryll Burger is working on getting patient transferred to [facility] care hospital for ERCP with PD stent for disrupted pancreas and secondary VRE peritonitis on Daptomycin Continue tube feeds Octreotide  On oral Vancomycin with improvement of C.diff  The patient was provided an opportunity to ask questions and all were answered. The patient agreed with the plan and demonstrated an understanding of the instructions.   [name] , MD [phone]

## 2022-09-11 NOTE — Plan of Care (Signed)
  Problem: Clinical Measurements: Goal: Diagnostic test results will improve Outcome: Progressing Goal: Signs and symptoms of infection will decrease Outcome: Progressing   Problem: Education: Goal: Knowledge of General Education information will improve Description: Including pain rating scale, medication(s)/side effects and non-pharmacologic comfort measures Outcome: Progressing   Problem: Health Behavior/Discharge Planning: Goal: Ability to manage health-related needs will improve Outcome: Progressing

## 2022-09-11 NOTE — Progress Notes (Signed)
Pharmacy Antibiotic Note  Samuel Blevins is a 57 y.o. male admitted on 08/26/2022 with decompensated liver cirrhosis, ascites and VRE peritonitis. Pharmacy has been consulted for daptomycin dosing. Also with C diff on oral vancomycin.  Scr improving now ClCr 32 ml/min.  Plan: Increase Daptomycin 700 mg IV q48h (10 mg/kg) to q24hr Monitor renal function, clinical progress, cultures/sensitivities F/U LOT per ID   Height: 5\' 9"  (175.3 cm) Weight: 70.8 kg (156 lb 1.4 oz) IBW/kg (Calculated) : 70.7  Temp (24hrs), Avg:99 F (37.2 C), Min:98.4 F (36.9 C), Max:100.3 F (37.9 C)  Recent Labs  Lab 09/06/22 0308 09/07/22 0231 09/07/22 1425 09/08/22 0515 09/08/22 0922 09/08/22 1636 09/09/22 0435 09/10/22 0017 09/11/22 0014  WBC 43.8* 31.9*  --  24.8*  --   --  20.4* 18.1*  --   CREATININE 2.19* 2.91*   < >  --  3.37* 3.35* 3.52* 3.07* 2.53*   < > = values in this interval not displayed.    Estimated Creatinine Clearance: 31.8 mL/min (A) (by C-G formula based on SCr of 2.53 mg/dL (H)).    No Known Allergies  Antimicrobials this admission: Cefepime 7/1 Zosyn 7/12 >> 7/14 Vanc PO (C diff +) 7/12 >> (7/26, potentially longer depending of DOT of dapto) Dapto 7/14 >>    7/15 CK: 69  Microbiology results: 7/1- Bcx- neg 7/1- peritoneal fluid cx- ngtd2 7/12 C diff + 7/12 Peritoneal washing - VRE   Thank you for involving pharmacy in this patient's care.  Alphia Moh, PharmD, BCPS, BCCP Clinical Pharmacist  Please check AMION for all [facility] Pharmacy phone numbers After 10:00 PM, call Main Pharmacy [phone]

## 2022-09-11 NOTE — Plan of Care (Signed)
  Problem: Fluid Volume: Goal: Hemodynamic stability will improve Outcome: Progressing   Problem: Clinical Measurements: Goal: Diagnostic test results will improve Outcome: Progressing Goal: Signs and symptoms of infection will decrease Outcome: Progressing   Problem: Education: Goal: Knowledge of General Education information will improve Description: Including pain rating scale, medication(s)/side effects and non-pharmacologic comfort measures Outcome: Progressing   Problem: Health Behavior/Discharge Planning: Goal: Ability to manage health-related needs will improve Outcome: Progressing   Problem: Clinical Measurements: Goal: Ability to maintain clinical measurements within normal limits will improve Outcome: Progressing Goal: Will remain free from infection Outcome: Progressing Goal: Diagnostic test results will improve Outcome: Progressing Goal: Respiratory complications will improve Outcome: Progressing Goal: Cardiovascular complication will be avoided Outcome: Progressing   Problem: Activity: Goal: Risk for activity intolerance will decrease Outcome: Progressing   Problem: Nutrition: Goal: Adequate nutrition will be maintained Outcome: Progressing   Problem: Coping: Goal: Level of anxiety will decrease Outcome: Progressing   Problem: Elimination: Goal: Will not experience complications related to bowel motility Outcome: Progressing Goal: Will not experience complications related to urinary retention Outcome: Progressing   Problem: Pain Managment: Goal: General experience of comfort will improve Outcome: Progressing   Problem: Safety: Goal: Ability to remain free from injury will improve Outcome: Progressing   Problem: Skin Integrity: Goal: Risk for impaired skin integrity will decrease Outcome: Progressing   Problem: Education: Goal: Ability to describe self-care measures that may prevent or decrease complications (Diabetes Survival Skills  Education) will improve Outcome: Progressing Goal: Individualized Educational Video(s) Outcome: Progressing   Problem: Coping: Goal: Ability to adjust to condition or change in health will improve Outcome: Progressing   Problem: Fluid Volume: Goal: Ability to maintain a balanced intake and output will improve Outcome: Progressing   Problem: Health Behavior/Discharge Planning: Goal: Ability to identify and utilize available resources and services will improve Outcome: Progressing Goal: Ability to manage health-related needs will improve Outcome: Progressing   Problem: Metabolic: Goal: Ability to maintain appropriate glucose levels will improve Outcome: Progressing   Problem: Nutritional: Goal: Maintenance of adequate nutrition will improve Outcome: Progressing Goal: Progress toward achieving an optimal weight will improve Outcome: Progressing   Problem: Skin Integrity: Goal: Risk for impaired skin integrity will decrease Outcome: Progressing   Problem: Tissue Perfusion: Goal: Adequacy of tissue perfusion will improve Outcome: Progressing

## 2022-09-11 NOTE — Progress Notes (Signed)
Pt requesting for PT/OT to be discontinued.  Education provided on importance of mobility and evaluation from PT/OT.  Pt frustrated and stating he is in pain and wants to sleep.  MD notified.

## 2022-09-11 NOTE — Progress Notes (Signed)
PROGRESS NOTE    Samuel Blevins  ZOX:[id number] DOB: 06/28/1965 DOA: 08/26/2022 PCP: [name], MD   Brief Narrative:    57 year old with history of recurrent pancreatitis, chronic pancreatic insufficiency, HTN , recurrent alcohol use comes to the hospital with complaints of abdominal pain, nausea, vomiting and diarrhea.  Patient was also found to have acute recurrent alcoholic pancreatitis.  Initially patient had left AMA about 4-5 days prior to this admission.  Patient readmitted for acute recurrent alcoholic pancreatitis with worsening ascites, initial labs of ascites fluid indicate elevated amylase concerning for pancreatic duct disruption.  Hospital course complicated with presence of recurrent ascites, infected ascites fluid, ongoing abdominal pain and acute renal failure.  Followed by GI and nephrology.  Now with infectious disease.  GI now recommending transfer to outside facility for ERCP due to pancreatic duct disruption.  Call made to [facility] with no beds available, however currently in discussion with GI procedures to see if patient may go for ERCP procedure and then come back.  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  Source presumed pancreatic duct disruption(elevated amylase on  paracentesis) and presumed SBP. -Paracentesis 7/1 - 3L; 7/5 - 3L; 7/8 2L, 7/12, 1.7 L. Echo: EF 60-65% with normal LVF and grade 1 diastolic dysfunction -Need for ERCP/pancreatic duct stenting at outside facility given questionable pancreatic duct disruption with noted amylase and ascitic fluid -7/8 cortrak placed in distal duodenum per GI recs , now eating full liquid diet without pain. -Hold tube feeding, advance to soft diet. -Blood culture, paracentesis fluid culture remain negative in the beginning but repeat paracentesis fluid on 09/06/2022 gross gram-positive cocci that is identified as VRE.  Now remains on daptomycin.  Followed by ID. -Urine culture shows multiple species, likely contaminant -Remains on octreotide. Poor prognosis.   C. difficile colitis: Patient also started having diarrhea on 09/06/2022, stool was tested positive for C. difficile, started on p.o. vancomycin by GI.  Diarrhea improved.  Continue vancomycin until he is on antibiotics and will need further as recommended by ID.   Acute hypoxic respiratory failure: Overnight on the night of 09/05/2022, patient went into respiratory distress, tachycardia and tachypnea and moved to progressive unit.  Chest x-ray was done which ruled out acute pathology but he did have some atelectasis, likely secondary to enlarging ascites.  Patient felt better after paracentesis.  Currently he is stable and comfortable on room air.   Recurrent alcohol induced pancreatitis. Nausea vomiting, resolved -Secondary to chronic alcohol use/abuse p.o. intake as tolerated, pain control avoiding IV narcotics, antiemetics, PPI   Severe protein caloric malnutrition High risk for refeeding syndrome -Complicated by chronic alcohol use -Core track placed 7/5 -dysfunctional 7/6 -replaced 09/02/2022, noted to be distal to the proximal duodenum. -Continue to follow electrolytes, mag/Phos.  Repleted as appropriate -Taking oral nutrition without excessive pain.   Discontinue tube  feeding today and advance to soft diet.  If eating adequate, discontinue core track tomorrow.   Alcohol abuse - Alcohol withdrawal protocol completed, currently outside the window for any further withdrawal symptoms.  No further need for benzos IV versus p.o.    Essential hypertension -Currently well-controlled off medications, as needed hydralazine for hypertensive events   GERD/duodenitis -Continue PPI   Acute kidney injury with hyperkalemia: Followed by nephrology.  Treated with Lokelma.  Urine output is adequate.  Slight improvement today.   Anemia of chronic disease: Hemoglobin dropping gradually, 7.3 today.  May benefit with iron transfusion, will defer to nephrology.   DVT prophylaxis: enoxaparin (LOVENOX) injection 30 mg Start: 09/07/22 1700   Code Status: Full Code  Family Communication:  None present at bedside.  Plan of care discussed with patient in length and he/she verbalized understanding and agreed with it.   Status is: Inpatient Remains inpatient appropriate because: Needs ERCP, timing to be determined by GI.  GI may also transfer him to [facility].  Now on IV antibiotics.  Will need IV antibiotic plans.     Estimated body mass index is 23.05 kg/m as calculated from the following:   Height as of this encounter: 5\' 9"  (1.753 m).   Weight as of this encounter: 70.8 kg.   Nutritional Assessment: Body mass index is 23.05 kg/m.Marland Kitchen Seen by dietician.  I agree with the assessment and plan as outlined below: Nutrition Status: Nutrition Problem: Severe Malnutrition Etiology: chronic illness (pancreatitis) Signs/Symptoms: severe fat depletion, severe muscle depletion, moderate fat depletion, moderate muscle depletion Interventions: Refer to RD note for recommendations   . Skin Assessment: I have examined the patient's skin and I agree with the wound assessment as performed by the wound care RN as outlined below:   Consultants:  GI Nephrology s/o Infectious  disease   Procedures:  As above    Antimicrobials:  Anti-infectives (From admission, onward)    Start     Dose/Rate Route Frequency Ordered Stop   09/10/22 1600  DAPTOmycin (CUBICIN) 700 mg in sodium chloride 0.9 % IVPB        10 mg/kg  71.8 kg 128 mL/hr over 30 Minutes Intravenous Every 48 hours 09/09/22 0816     09/08/22 1600  DAPTOmycin (CUBICIN) 550 mg in sodium chloride 0.9 % IVPB  Status:  Discontinued        8 mg/kg  70.6 kg 122 mL/hr over 30 Minutes Intravenous Every 48 hours 09/08/22 1501 09/09/22 0816   09/06/22 1400  vancomycin (VANCOCIN) 50 mg/mL oral solution SOLN 125 mg        125 mg Oral Every 6 hours 09/06/22 1304 09/20/22 1159   09/06/22 1115  piperacillin-tazobactam (ZOSYN) IVPB 3.375 g  Status:  Discontinued        3.375 g 12.5 mL/hr over 240 Minutes Intravenous Every 8 hours 09/06/22 1017 09/08/22 1406   08/26/22 1800  ceFEPIme (MAXIPIME) 2 g in sodium chloride 0.9 % 100 mL IVPB        2 g 200 mL/hr over 30 Minutes Intravenous Every 8 hours 08/26/22 1133 09/02/22 1132   08/26/22 1000  ceFEPIme (MAXIPIME) 2 g in sodium chloride 0.9 % 100 mL IVPB        2 g 200 mL/hr over 30 Minutes Intravenous  Once 08/26/22 0953 08/26/22 1051   08/26/22 1000  metroNIDAZOLE (FLAGYL) IVPB 500 mg        500 mg 100 mL/hr over 60 Minutes Intravenous  Once 08/26/22 0953 08/26/22 1213  Subjective: Patient seen and evaluated today with no new acute complaints or concerns. No acute concerns or events noted overnight. Denies N/V and had 2 BM today. He continues with generalized abdominal pain.  Objective: Vitals:   09/10/22 1700 09/11/22 0500 09/11/22 0528 09/11/22 0744  BP: (!) 137/91  123/78 128/80  Pulse:   100 100  Resp: (!) 21  20 19   Temp: 99 F (37.2 C)  98.4 F (36.9 C) 98.4 F (36.9 C)  TempSrc: Oral  Oral Oral  SpO2:    99%  Weight:  70.8 kg    Height:        Intake/Output Summary (Last 24 hours) at 09/11/2022 1329 Last data filed at 09/11/2022 1301 Gross  per 24 hour  Intake 92.17 ml  Output 200 ml  Net -107.83 ml   Filed Weights   09/09/22 0421 09/10/22 0429 09/11/22 0500  Weight: 71.8 kg 70.8 kg 70.8 kg    Examination:  General exam: Appears calm and comfortable  Respiratory system: Clear to auscultation. Respiratory effort normal. Cardiovascular system: S1 & S2 heard, RRR.  Gastrointestinal system: Abdomen is soft, and distended, NG tube feedings Central nervous system: Alert and awake Extremities: No edema Skin: No significant lesions noted Psychiatry: Flat affect.    Data Reviewed: I have personally reviewed following labs and imaging studies  CBC: Recent Labs  Lab 09/05/22 2217 09/06/22 0308 09/07/22 0231 09/08/22 0515 09/09/22 0435 09/10/22 0017  WBC 24.2* 43.8* 31.9* 24.8* 20.4* 18.1*  NEUTROABS 21.2*  --   --   --   --  14.9*  HGB 11.6* 9.7* 7.5* 7.5* 7.2* 7.3*  HCT 35.9* 30.2* 22.2* 22.4* 22.1* 22.3*  MCV 87.3 89.9 84.4 84.2 86.7 86.4  PLT 1,107* 994* 739* 739* 717* 732*   Basic Metabolic Panel: Recent Labs  Lab 09/05/22 1026 09/05/22 2217 09/06/22 0308 09/07/22 0231 09/07/22 1425 09/08/22 0515 09/08/22 0922 09/08/22 1636 09/09/22 0435 09/10/22 0017 09/11/22 0014  NA 133*   < > 135 130*   < >  --  131* 132* 132* 132* 133*  K 4.9   < > 5.4* 5.8*   < >  --  5.4* 5.0 4.5 3.9 4.1  CL 102   < > 100 98   < >  --  95* 93* 97* 100 101  CO2 26   < > 20* 24   < >  --  25 23 24  21* 24  GLUCOSE 138*   < > 167* 105*   < >  --  98 114* 91 125* 119*  BUN 13   < > 22* 32*   < >  --  31* 32* 33* 30* 26*  CREATININE 1.21   < > 2.19* 2.91*   < >  --  3.37* 3.35* 3.52* 3.07* 2.53*  CALCIUM 8.0*   < > 8.5* 8.1*   < >  --  8.4* 8.5* 8.3* 8.2* 8.1*  MG 1.6*   < > 2.3 2.3  --  2.5*  --   --  2.2 2.3  --   PHOS 2.9  --  3.3 4.2  --   --   --   --  5.1* 4.4  --    < > = values in this interval not displayed.   GFR: Estimated Creatinine Clearance: 31.8 mL/min (A) (by C-G formula based on SCr of 2.53 mg/dL  (H)). Liver Function Tests: Recent Labs  Lab 09/06/22 0308 09/07/22 0231 09/08/22 9528 09/09/22 0435 09/10/22 0017 09/11/22  0014  AST 25 29 25   --  28 45*  ALT 17 15 14   --  18 31  ALKPHOS 71 64 85  --  132* 216*  BILITOT 0.7 0.5 0.8  --  0.5 0.7  PROT 7.6 7.0 7.6  --  7.4 7.6  ALBUMIN 2.0* 2.0* 2.4* 2.9* 2.7* 2.6*   No results for input(s): "LIPASE", "AMYLASE" in the last 168 hours. No results for input(s): "AMMONIA" in the last 168 hours. Coagulation Profile: No results for input(s): "INR", "PROTIME" in the last 168 hours. Cardiac Enzymes: Recent Labs  Lab 09/08/22 1636  CKTOTAL 69   BNP (last 3 results) No results for input(s): "PROBNP" in the last 8760 hours. HbA1C: No results for input(s): "HGBA1C" in the last 72 hours. CBG: Recent Labs  Lab 09/10/22 2013 09/10/22 2352 09/11/22 0524 09/11/22 0743 09/11/22 1201  GLUCAP 127* 118* 114* 123* 129*   Lipid Profile: No results for input(s): "CHOL", "HDL", "LDLCALC", "TRIG", "CHOLHDL", "LDLDIRECT" in the last 72 hours. Thyroid Function Tests: No results for input(s): "TSH", "T4TOTAL", "FREET4", "T3FREE", "THYROIDAB" in the last 72 hours. Anemia Panel: No results for input(s): "VITAMINB12", "FOLATE", "FERRITIN", "TIBC", "IRON", "RETICCTPCT" in the last 72 hours. Sepsis Labs: No results for input(s): "PROCALCITON", "LATICACIDVEN" in the last 168 hours.  Recent Results (from the past 240 hour(s))  Gram stain     Status: None   Collection Time: 09/02/22  2:47 PM   Specimen: Abdomen; Peritoneal Fluid  Result Value Ref Range Status   Specimen Description ABDOMEN  Final   Special Requests NONE  Final   Gram Stain   Final    FEW WBC SEEN NO ORGANISMS SEEN Performed at [facility], [address] [place], [place] [address]    Report Status 09/02/2022 FINAL  Final  Culture, body fluid w Gram Stain-bottle     Status: Abnormal   Collection Time: 09/06/22  9:18 AM   Specimen: Peritoneal Washings  Result Value  Ref Range Status   Specimen Description PERITONEAL  Final   Special Requests NONE  Final   Gram Stain   Final    GRAM POSITIVE COCCI IN CHAINS IN BOTH AEROBIC AND ANAEROBIC BOTTLES CRITICAL RESULT CALLED TO, READ BACK BY AND VERIFIED WITH: RN YOKO IMAI  ON 09/06/22 @ 1738 BY DRT Performed at [facility], [address] [place], [place] [address]    Culture (A)  Final    ENTEROCOCCUS FAECIUM VANCOMYCIN RESISTANT ENTEROCOCCUS    Report Status 09/08/2022 FINAL  Final   Organism ID, Bacteria ENTEROCOCCUS FAECIUM  Final      Susceptibility   Enterococcus faecium - MIC*    AMPICILLIN >=32 RESISTANT Resistant     VANCOMYCIN >=32 RESISTANT Resistant     GENTAMICIN SYNERGY SENSITIVE Sensitive     * ENTEROCOCCUS FAECIUM  Gram stain     Status: None   Collection Time: 09/06/22  9:18 AM   Specimen: Peritoneal Washings  Result Value Ref Range Status   Specimen Description PERITONEAL  Final   Special Requests NONE  Final   Gram Stain   Final    ABUNDANT WBC PRESENT, PREDOMINANTLY PMN NO ORGANISMS SEEN Performed at [facility], [address], [place], [place] [address]    Report Status 09/06/2022 FINAL  Final  C Difficile Quick Screen (NO PCR Reflex)     Status: Abnormal   Collection Time: 09/06/22 10:47 AM   Specimen: STOOL  Result Value Ref Range Status  C Diff antigen POSITIVE (A) NEGATIVE Final   C Diff toxin POSITIVE (A) NEGATIVE Final    Comment: CRITICAL RESULT CALLED TO, READ BACK BY AND VERIFIED WITH:  09/06/2022 1047 RN ROOSEVELT    C Diff interpretation Toxin producing C. difficile detected.  Final    Comment: CRITICAL RESULT CALLED TO, READ BACK BY AND VERIFIED WITH:  09/06/2022 1047 RN ROOSEVELT Performed at [facility], [address] [place], [place] [address]          Radiology Studies: No results found.      Scheduled Meds:  acidophilus  2 capsule Oral TID   enoxaparin (LOVENOX) injection  30 mg Subcutaneous Q24H   feeding  supplement  237 mL Oral BID BM   feeding supplement (PROSource TF20)  60 mL Per Tube Daily   fiber supplement (BANATROL TF)  60 mL Per Tube TID   folic acid  1 mg Oral Daily   gabapentin  300 mg Oral QHS   lidocaine  1 patch Transdermal Q24H   lipase/protease/amylase  12,000 Units Oral TID AC   multivitamin with minerals  1 tablet Oral Daily   octreotide  200 mcg Subcutaneous Q12H   pantoprazole  40 mg Oral BID AC   sodium chloride flush  10-40 mL Intracatheter Q12H   thiamine  100 mg Oral Daily   vancomycin  125 mg Oral Q6H   Continuous Infusions:  DAPTOmycin (CUBICIN) 700 mg in sodium chloride 0.9 % IVPB Stopped (09/10/22 1627)   feeding supplement (VITAL 1.5 CAL) 55 mL/hr at 09/11/22 1327     LOS: 15 days    Time spent: 35 minutes    Anavey Coombes D Sherryll Burger, DO [facility]  If 7PM-7AM, please contact night-coverage [email or web address] 09/11/2022, 1:29 PM

## 2022-09-11 NOTE — Progress Notes (Signed)
No plan for ERCP today - pt to resume tube feeds and is no longer NPO.  Contacted dietary for recommendations on tube feed.

## 2022-09-11 NOTE — Progress Notes (Signed)
PT Cancellation Note  Patient Details Name: Celso Granja MRN: [id number] DOB: 1965-09-26   Cancelled Treatment:    Reason Eval/Treat Not Completed: Other (comment) (orders discontinued for therapy.)   Wynn Maudlin, DPT Acute Rehabilitation Services Office [phone]  09/11/22 11:30 AM

## 2022-09-12 DIAGNOSIS — R6520 Severe sepsis without septic shock: Secondary | ICD-10-CM

## 2022-09-12 DIAGNOSIS — A419 Sepsis, unspecified organism: Secondary | ICD-10-CM | POA: Diagnosis not present

## 2022-09-12 LAB — COMPREHENSIVE METABOLIC PANEL
ALT: 30 U/L (ref 0–44)
AST: 40 U/L (ref 15–41)
Albumin: 2.5 g/dL — ABNORMAL LOW (ref 3.5–5.0)
Alkaline Phosphatase: 212 U/L — ABNORMAL HIGH (ref 38–126)
Anion gap: 9 (ref 5–15)
BUN: 26 mg/dL — ABNORMAL HIGH (ref 6–20)
CO2: 24 mmol/L (ref 22–32)
Calcium: 8.3 mg/dL — ABNORMAL LOW (ref 8.9–10.3)
Chloride: 102 mmol/L (ref 98–111)
Creatinine, Ser: 1.86 mg/dL — ABNORMAL HIGH (ref 0.61–1.24)
GFR, Estimated: 41 mL/min — ABNORMAL LOW (ref >60)
Glucose, Bld: 145 mg/dL — ABNORMAL HIGH (ref 70–99)
Potassium: 4.1 mmol/L (ref 3.5–5.1)
Sodium: 135 mmol/L (ref 135–145)
Total Bilirubin: 0.5 mg/dL (ref 0.3–1.2)
Total Protein: 7.8 g/dL (ref 6.5–8.1)

## 2022-09-12 LAB — CBC
HCT: 23.5 % — ABNORMAL LOW (ref 39.0–52.0)
Hemoglobin: 7.5 g/dL — ABNORMAL LOW (ref 13.0–17.0)
MCH: 27.4 pg (ref 26.0–34.0)
MCHC: 31.9 g/dL (ref 30.0–36.0)
MCV: 85.8 fL (ref 80.0–100.0)
Platelets: 795 10*3/uL — ABNORMAL HIGH (ref 150–400)
RBC: 2.74 MIL/uL — ABNORMAL LOW (ref 4.22–5.81)
RDW: 21.6 % — ABNORMAL HIGH (ref 11.5–15.5)
WBC: 20.4 10*3/uL — ABNORMAL HIGH (ref 4.0–10.5)
nRBC: 0 % (ref 0.0–0.2)

## 2022-09-12 LAB — GLUCOSE, CAPILLARY
Glucose-Capillary: 135 mg/dL — ABNORMAL HIGH (ref 70–99)
Glucose-Capillary: 93 mg/dL (ref 70–99)
Glucose-Capillary: 101 mg/dL — ABNORMAL HIGH (ref 70–99)

## 2022-09-12 LAB — MAGNESIUM: Magnesium: 2 mg/dL (ref 1.7–2.4)

## 2022-09-12 MED ORDER — BANATROL TF EN LIQD
60.0000 mL | Freq: Three times a day (TID) | ENTERAL | Status: DC
  Administered 2022-09-12 – 2022-09-13 (×2): 60 mL via ORAL
  Filled 2022-09-12 (×3): qty 60

## 2022-09-12 NOTE — Progress Notes (Signed)
Security called per policy after earlier patient findings. Security in the room to search patient belongings. Big orange bag full of multiple bottles of unlisted miscellaneous medications found uncounted and sent to pharmacy. When asked about his cigarettes the patient stated he flushed them, upon further search, the nurse found them under the cushion of his Rolator with multiple razors. All confiscated and sent to pharmacy with patient belongings. Copy placed in shadow chart. Patient advised that he couldn't keep the items in the room prior to confiscation.

## 2022-09-12 NOTE — Progress Notes (Signed)
While putting clean linens on patient bed after patient pulled his core trak out nurse noted that patient was getting his patient belonging bags nearby. He pulled out a brown bag within a bag. Out of the bag he pulled a cigarette and proceeded to twist out the tobacco from the tip into his hand which he then dropped into his mouth and chewed up and swallowed. When asked about it the patient said "you didn't see anything". He then proceeded to placed the cigarette back into the bag and back into hiding. Both the charge nurse and nurse manager notified. Physician also notified of findings.

## 2022-09-12 NOTE — Plan of Care (Signed)
  Problem: Fluid Volume: Goal: Hemodynamic stability will improve Outcome: Progressing   Problem: Clinical Measurements: Goal: Diagnostic test results will improve Outcome: Progressing Goal: Signs and symptoms of infection will decrease Outcome: Progressing   Problem: Education: Goal: Knowledge of General Education information will improve Description: Including pain rating scale, medication(s)/side effects and non-pharmacologic comfort measures Outcome: Progressing   Problem: Health Behavior/Discharge Planning: Goal: Ability to manage health-related needs will improve Outcome: Progressing   Problem: Clinical Measurements: Goal: Ability to maintain clinical measurements within normal limits will improve Outcome: Progressing Goal: Will remain free from infection Outcome: Progressing Goal: Diagnostic test results will improve Outcome: Progressing Goal: Respiratory complications will improve Outcome: Progressing Goal: Cardiovascular complication will be avoided Outcome: Progressing   Problem: Activity: Goal: Risk for activity intolerance will decrease Outcome: Progressing   Problem: Nutrition: Goal: Adequate nutrition will be maintained Outcome: Progressing   Problem: Coping: Goal: Level of anxiety will decrease Outcome: Progressing   Problem: Elimination: Goal: Will not experience complications related to bowel motility Outcome: Progressing Goal: Will not experience complications related to urinary retention Outcome: Progressing   Problem: Pain Managment: Goal: General experience of comfort will improve Outcome: Progressing   Problem: Safety: Goal: Ability to remain free from injury will improve Outcome: Progressing   Problem: Skin Integrity: Goal: Risk for impaired skin integrity will decrease Outcome: Progressing   Problem: Education: Goal: Ability to describe self-care measures that may prevent or decrease complications (Diabetes Survival Skills  Education) will improve Outcome: Progressing Goal: Individualized Educational Video(s) Outcome: Progressing   Problem: Coping: Goal: Ability to adjust to condition or change in health will improve Outcome: Progressing   Problem: Fluid Volume: Goal: Ability to maintain a balanced intake and output will improve Outcome: Progressing   Problem: Health Behavior/Discharge Planning: Goal: Ability to identify and utilize available resources and services will improve Outcome: Progressing Goal: Ability to manage health-related needs will improve Outcome: Progressing   Problem: Metabolic: Goal: Ability to maintain appropriate glucose levels will improve Outcome: Progressing   Problem: Nutritional: Goal: Maintenance of adequate nutrition will improve Outcome: Progressing Goal: Progress toward achieving an optimal weight will improve Outcome: Progressing   Problem: Skin Integrity: Goal: Risk for impaired skin integrity will decrease Outcome: Progressing   Problem: Tissue Perfusion: Goal: Adequacy of tissue perfusion will improve Outcome: Progressing

## 2022-09-12 NOTE — Progress Notes (Signed)
PROGRESS NOTE  Samuel Blevins ZOX:[id number] DOB: 03/29/1965 DOA: 08/26/2022 PCP: [name], MD  HPI/Recap of past 37 hours:  57 year old with history of recurrent pancreatitis, chronic pancreatic insufficiency, HTN , recurrent alcohol use comes to the hospital with complaints of abdominal pain, nausea, vomiting and diarrhea.  Patient was also found to have acute recurrent alcoholic pancreatitis.  Initially patient had left AMA about 4-5 days prior to this admission.  Patient readmitted for acute recurrent alcoholic pancreatitis with worsening ascites, initial labs of ascites fluid indicate elevated amylase concerning for pancreatic duct disruption.  Hospital course complicated with presence of recurrent ascites, infected ascites fluid, ongoing abdominal pain and acute renal failure.  Followed by GI and nephrology.  Now with infectious disease.  GI now recommending transfer to outside facility for ERCP due to pancreatic duct disruption.  Call made to [facility] with no beds available, however currently in discussion with GI procedures to see if patient may go for ERCP procedure and then come back.   09/12/2022: The patient was seen and examined at his bedside.  He removed his core track this morning.  Advised the importance of nutrition in his current condition.  He was receptive and agreed to keep it on if replaced.  Admits to mild to moderate diffuse abdominal pain.  States he had 2 watery stools since this morning.  Assessment/Plan: Principal Problem:   Sepsis (HCC) Active Problems:   Alcohol dependence syndrome (HCC)   Pseudocyst of pancreas due to acute pancreatitis   Alcoholic pancreatitis   Acute on chronic pancreatitis (HCC)   Pancreatitis   Acute pancreatitis   Other ascites   Protein-calorie malnutrition, severe   Alcoholic liver disease (HCC)   Pancreatic ascites   Anasarca   SIRS (systemic inflammatory response syndrome) (HCC)   Infected ascites   Enteritis due to  Clostridium difficile   Peritonitis (HCC)   Bacterial peritonitis (HCC)  Decompensated liver cirrhosis with ascites Sepsis secondary to SBP versus infected pancreatic ascites Hypoalbuminemia Hypovolemic hyponatremia -Sepsis evidenced by tachycardia and leukocytosis.  Source presumed pancreatic duct disruption(elevated amylase on paracentesis) and presumed SBP. -Paracentesis 7/1 - 3L; 7/5 - 3L; 7/8 2L, 7/12, 1.7 L. Echo: EF 60-65% with normal LVF and grade 1 diastolic dysfunction -Need for ERCP/pancreatic duct stenting at outside facility given questionable pancreatic duct disruption with noted amylase and ascitic fluid -7/8 cortrak placed in distal duodenum per GI recs , now eating full liquid diet without pain. -Hold tube feeding, advance to soft diet. -Blood culture, paracentesis fluid culture remain negative in the beginning but repeat paracentesis fluid on 09/06/2022 gross gram-positive cocci that is identified as VRE.  Now remains on daptomycin.  Followed by ID. -Urine culture shows multiple species, likely contaminant -Remains on octreotide. Poor prognosis.   C. difficile colitis: Patient also started having diarrhea on 09/06/2022, stool was tested positive for C. difficile, started on p.o. vancomycin by GI.  Diarrhea improved.  Continue p.o. vancomycin until he is on antibiotics and will need further as recommended by ID.   Acute hypoxic respiratory failure: Overnight on the night of 09/05/2022, patient went into respiratory distress, tachycardia and tachypnea and moved to progressive unit.  Chest x-ray was done which ruled out acute pathology but he did have some atelectasis, likely secondary to enlarging ascites.  Patient felt better after paracentesis.  Currently he is stable and comfortable on room air.   Recurrent alcohol induced pancreatitis. Nausea vomiting, resolved -Secondary to chronic alcohol use/abuse p.o. intake as tolerated, pain  control avoiding IV narcotics, antiemetics,  PPI   Severe protein caloric malnutrition High risk for refeeding syndrome -Complicated by chronic alcohol use -Core track placed 7/5 -dysfunctional 7/6 -replaced 09/02/2022, noted to be distal to the proximal duodenum. -Continue to follow electrolytes, mag/Phos.  Repleted as appropriate -Taking oral nutrition without excessive pain.  Discontinue tube feeding today and advance to soft diet.  If eating adequate, discontinue core track tomorrow.   Alcohol abuse - Alcohol withdrawal protocol completed, currently outside the window for any further withdrawal symptoms.  No further need for benzos IV versus p.o.    Essential hypertension -Currently well-controlled off medications, as needed hydralazine for hypertensive events   GERD/duodenitis -Continue PPI   Acute kidney injury with hyperkalemia: Followed by nephrology.  Treated with Lokelma.  Urine output is adequate.  Slight improvement today.   Anemia of chronic disease: Hemoglobin dropping gradually, 7.3 today.  May benefit with iron transfusion, will defer to nephrology.   DVT prophylaxis: enoxaparin (LOVENOX) injection 30 mg Start: 09/07/22 1700   Code Status: Full Code  Family Communication:  None present at bedside.  Plan of care discussed with patient in length and he/she verbalized understanding and agreed with it.   Status is: Inpatient Remains inpatient appropriate because: Needs ERCP, timing to be determined by GI.  GI may also transfer him to [facility].  Now on IV antibiotics.  Will need IV antibiotic plans.     Estimated body mass index is 23.05 kg/m as calculated from the following:   Height as of this encounter: 5\' 9"  (1.753 m).   Weight as of this encounter: 70.8 kg.   Nutritional Assessment: Body mass index is 23.05 kg/m.Marland Kitchen Seen by dietician.  I agree with the assessment and plan as outlined below: Nutrition Status: Nutrition Problem: Severe Malnutrition Etiology: chronic illness (pancreatitis) Signs/Symptoms: severe  fat depletion, severe muscle depletion, moderate fat depletion, moderate muscle depletion Interventions: Refer to RD note for recommendations   . Skin Assessment: I have examined the patient's skin and I agree with the wound assessment as performed by the wound care RN as outlined below:   Consultants:  GI Nephrology s/o Infectious disease   Procedures:  As above   Objective: Vitals:   09/11/22 1639 09/11/22 2030 09/12/22 0424 09/12/22 1159  BP: 134/86 (!) 136/90 135/87 121/82  Pulse: (!) 109 (!) 103 99 (!) 108  Resp: (!) 21 (!) 22 20 20   Temp: 99 F (37.2 C) 97.8 F (36.6 C) 97.8 F (36.6 C) 98.9 F (37.2 C)  TempSrc: Oral Oral Oral Oral  SpO2: 99% 99%  99%  Weight:   70.4 kg   Height:        Intake/Output Summary (Last 24 hours) at 09/12/2022 1459 Last data filed at 09/12/2022 1308 Gross per 24 hour  Intake 1498.49 ml  Output 725 ml  Net 773.49 ml   Filed Weights   09/10/22 0429 09/11/22 0500 09/12/22 0424  Weight: 70.8 kg 70.8 kg 70.4 kg    Exam:  General: 57 y.o. year-old male well developed well nourished in no acute distress.  Alert and oriented x3. Cardiovascular: Regular rate and rhythm with no rubs or gallops.  No thyromegaly or JVD noted.   Respiratory: Clear to auscultation with no wheezes or rales. Good inspiratory effort. Abdomen: Soft nondistended with normal bowel sounds x4 quadrants. Musculoskeletal: No lower extremity edema. 2/4 pulses in all 4 extremities. Skin: No ulcerative lesions noted or rashes, Psychiatry: Mood is appropriate for condition and setting  Data Reviewed: CBC: Recent Labs  Lab 09/05/22 2217 09/06/22 0308 09/07/22 0231 09/08/22 0515 09/09/22 0435 09/10/22 0017 09/12/22 0117  WBC 24.2*   < > 31.9* 24.8* 20.4* 18.1* 20.4*  NEUTROABS 21.2*  --   --   --   --  14.9*  --   HGB 11.6*   < > 7.5* 7.5* 7.2* 7.3* 7.5*  HCT 35.9*   < > 22.2* 22.4* 22.1* 22.3* 23.5*  MCV 87.3   < > 84.4 84.2 86.7 86.4 85.8  PLT 1,107*   < >  739* 739* 717* 732* 795*   < > = values in this interval not displayed.   Basic Metabolic Panel: Recent Labs  Lab 09/06/22 0308 09/07/22 0231 09/07/22 1425 09/08/22 0515 09/08/22 0981 09/08/22 1636 09/09/22 0435 09/10/22 0017 09/11/22 0014 09/12/22 0117  NA 135 130*   < >  --    < > 132* 132* 132* 133* 135  K 5.4* 5.8*   < >  --    < > 5.0 4.5 3.9 4.1 4.1  CL 100 98   < >  --    < > 93* 97* 100 101 102  CO2 20* 24   < >  --    < > 23 24 21* 24 24  GLUCOSE 167* 105*   < >  --    < > 114* 91 125* 119* 145*  BUN 22* 32*   < >  --    < > 32* 33* 30* 26* 26*  CREATININE 2.19* 2.91*   < >  --    < > 3.35* 3.52* 3.07* 2.53* 1.86*  CALCIUM 8.5* 8.1*   < >  --    < > 8.5* 8.3* 8.2* 8.1* 8.3*  MG 2.3 2.3  --  2.5*  --   --  2.2 2.3  --  2.0  PHOS 3.3 4.2  --   --   --   --  5.1* 4.4  --   --    < > = values in this interval not displayed.   GFR: Estimated Creatinine Clearance: 43.1 mL/min (A) (by C-G formula based on SCr of 1.86 mg/dL (H)). Liver Function Tests: Recent Labs  Lab 09/07/22 0231 09/08/22 0922 09/09/22 0435 09/10/22 0017 09/11/22 0014 09/12/22 0117  AST 29 25  --  28 45* 40  ALT 15 14  --  18 31 30   ALKPHOS 64 85  --  132* 216* 212*  BILITOT 0.5 0.8  --  0.5 0.7 0.5  PROT 7.0 7.6  --  7.4 7.6 7.8  ALBUMIN 2.0* 2.4* 2.9* 2.7* 2.6* 2.5*   No results for input(s): "LIPASE", "AMYLASE" in the last 168 hours. No results for input(s): "AMMONIA" in the last 168 hours. Coagulation Profile: No results for input(s): "INR", "PROTIME" in the last 168 hours. Cardiac Enzymes: Recent Labs  Lab 09/08/22 1636  CKTOTAL 69   BNP (last 3 results) No results for input(s): "PROBNP" in the last 8760 hours. HbA1C: No results for input(s): "HGBA1C" in the last 72 hours. CBG: Recent Labs  Lab 09/11/22 0524 09/11/22 0743 09/11/22 1201 09/11/22 2330 09/12/22 0420  GLUCAP 114* 123* 129* 120* 135*   Lipid Profile: No results for input(s): "CHOL", "HDL", "LDLCALC", "TRIG",  "CHOLHDL", "LDLDIRECT" in the last 72 hours. Thyroid Function Tests: No results for input(s): "TSH", "T4TOTAL", "FREET4", "T3FREE", "THYROIDAB" in the last 72 hours. Anemia Panel: No results for input(s): "VITAMINB12", "FOLATE", "FERRITIN", "TIBC", "IRON", "RETICCTPCT" in the last  72 hours. Urine analysis:    Component Value Date/Time   COLORURINE YELLOW 09/08/2022 1652   APPEARANCEUR CLOUDY (A) 09/08/2022 1652   LABSPEC 1.016 09/08/2022 1652   PHURINE 5.0 09/08/2022 1652   GLUCOSEU NEGATIVE 09/08/2022 1652   HGBUR NEGATIVE 09/08/2022 1652   BILIRUBINUR NEGATIVE 09/08/2022 1652   KETONESUR NEGATIVE 09/08/2022 1652   PROTEINUR 100 (A) 09/08/2022 1652   NITRITE NEGATIVE 09/08/2022 1652   LEUKOCYTESUR LARGE (A) 09/08/2022 1652   Sepsis Labs: @LABRCNTIP (procalcitonin:4,lacticidven:4)  ) Recent Results (from the past 240 hour(s))  Culture, body fluid w Gram Stain-bottle     Status: Abnormal   Collection Time: 09/06/22  9:18 AM   Specimen: Peritoneal Washings  Result Value Ref Range Status   Specimen Description PERITONEAL  Final   Special Requests NONE  Final   Gram Stain   Final    GRAM POSITIVE COCCI IN CHAINS IN BOTH AEROBIC AND ANAEROBIC BOTTLES CRITICAL RESULT CALLED TO, READ BACK BY AND VERIFIED WITH: RN YOKO IMAI  ON 09/06/22 @ 1738 BY DRT Performed at [facility], [address], [place], [place] [address]    Culture (A)  Final    ENTEROCOCCUS FAECIUM VANCOMYCIN RESISTANT ENTEROCOCCUS    Report Status 09/08/2022 FINAL  Final   Organism ID, Bacteria ENTEROCOCCUS FAECIUM  Final      Susceptibility   Enterococcus faecium - MIC*    AMPICILLIN >=32 RESISTANT Resistant     VANCOMYCIN >=32 RESISTANT Resistant     GENTAMICIN SYNERGY SENSITIVE Sensitive     * ENTEROCOCCUS FAECIUM  Gram stain     Status: None   Collection Time: 09/06/22  9:18 AM   Specimen: Peritoneal Washings  Result Value Ref Range Status   Specimen Description PERITONEAL  Final   Special  Requests NONE  Final   Gram Stain   Final    ABUNDANT WBC PRESENT, PREDOMINANTLY PMN NO ORGANISMS SEEN Performed at [facility], [address], [place], [place] [address]    Report Status 09/06/2022 FINAL  Final  C Difficile Quick Screen (NO PCR Reflex)     Status: Abnormal   Collection Time: 09/06/22 10:47 AM   Specimen: STOOL  Result Value Ref Range Status   C Diff antigen POSITIVE (A) NEGATIVE Final   C Diff toxin POSITIVE (A) NEGATIVE Final    Comment: CRITICAL RESULT CALLED TO, READ BACK BY AND VERIFIED WITH:  09/06/2022 1047 RN ROOSEVELT    C Diff interpretation Toxin producing C. difficile detected.  Final    Comment: CRITICAL RESULT CALLED TO, READ BACK BY AND VERIFIED WITH:  09/06/2022 1047 RN ROOSEVELT Performed at [facility], [address], [place], [place] [address]       Studies: No results found.  Scheduled Meds:  acidophilus  2 capsule Oral TID   enoxaparin (LOVENOX) injection  40 mg Subcutaneous QPM   feeding supplement  237 mL Oral BID BM   feeding supplement (PROSource TF20)  60 mL Per Tube Daily   fiber supplement (BANATROL TF)  60 mL Per Tube TID   folic acid  1 mg Oral Daily   gabapentin  300 mg Oral QHS   lidocaine  1 patch Transdermal Q24H   lipase/protease/amylase  12,000 Units Oral TID AC   multivitamin with minerals  1 tablet Oral Daily   octreotide  200 mcg Subcutaneous Q12H   pantoprazole  40 mg Oral BID AC   sodium chloride flush  10-40 mL Intracatheter Q12H  thiamine  100 mg Oral Daily   vancomycin  125 mg Oral Q6H    Continuous Infusions:  DAPTOmycin (CUBICIN) 700 mg in sodium chloride 0.9 % IVPB 700 mg (09/12/22 1308)   feeding supplement (VITAL 1.5 CAL) 55 mL/hr at 09/12/22 1144     LOS: 16 days     Darlin Drop, MD [facility] Pager [phone]  If 7PM-7AM, please contact night-coverage [email or web address] Password [facility] 09/12/2022, 2:59 PM

## 2022-09-12 NOTE — Progress Notes (Signed)
Asked physician about core trak being out, no one from core trak team available today to replace. No nurses trained to replace in house. Okay to leave out until tomorrow when someone available to replace it.

## 2022-09-12 NOTE — Plan of Care (Signed)
  Problem: Clinical Measurements: Goal: Diagnostic test results will improve Outcome: Progressing Goal: Signs and symptoms of infection will decrease Outcome: Progressing   Problem: Clinical Measurements: Goal: Ability to maintain clinical measurements within normal limits will improve Outcome: Progressing Goal: Will remain free from infection Outcome: Progressing Goal: Diagnostic test results will improve Outcome: Progressing Goal: Respiratory complications will improve Outcome: Progressing Goal: Cardiovascular complication will be avoided Outcome: Progressing   Problem: Activity: Goal: Risk for activity intolerance will decrease Outcome: Progressing   Problem: Nutrition: Goal: Adequate nutrition will be maintained Outcome: Progressing

## 2022-09-13 ENCOUNTER — Inpatient Hospital Stay (HOSPITAL_COMMUNITY): Payer: MEDICAID

## 2022-09-13 DIAGNOSIS — E871 Hypo-osmolality and hyponatremia: Secondary | ICD-10-CM | POA: Diagnosis not present

## 2022-09-13 DIAGNOSIS — A419 Sepsis, unspecified organism: Secondary | ICD-10-CM | POA: Diagnosis not present

## 2022-09-13 DIAGNOSIS — R6520 Severe sepsis without septic shock: Secondary | ICD-10-CM | POA: Diagnosis not present

## 2022-09-13 DIAGNOSIS — K861 Other chronic pancreatitis: Secondary | ICD-10-CM | POA: Diagnosis not present

## 2022-09-13 DIAGNOSIS — N179 Acute kidney failure, unspecified: Secondary | ICD-10-CM | POA: Diagnosis not present

## 2022-09-13 DIAGNOSIS — A0472 Enterocolitis due to Clostridium difficile, not specified as recurrent: Secondary | ICD-10-CM | POA: Diagnosis not present

## 2022-09-13 LAB — CBC
HCT: 21.3 % — ABNORMAL LOW (ref 39.0–52.0)
Hemoglobin: 7.1 g/dL — ABNORMAL LOW (ref 13.0–17.0)
MCH: 28.6 pg (ref 26.0–34.0)
MCHC: 33.3 g/dL (ref 30.0–36.0)
MCV: 85.9 fL (ref 80.0–100.0)
Platelets: 782 10*3/uL — ABNORMAL HIGH (ref 150–400)
RBC: 2.48 MIL/uL — ABNORMAL LOW (ref 4.22–5.81)
RDW: 21.1 % — ABNORMAL HIGH (ref 11.5–15.5)
WBC: 17.1 10*3/uL — ABNORMAL HIGH (ref 4.0–10.5)
nRBC: 0 % (ref 0.0–0.2)

## 2022-09-13 LAB — COMPREHENSIVE METABOLIC PANEL
ALT: 29 U/L (ref 0–44)
AST: 36 U/L (ref 15–41)
Albumin: 2.3 g/dL — ABNORMAL LOW (ref 3.5–5.0)
Alkaline Phosphatase: 158 U/L — ABNORMAL HIGH (ref 38–126)
Anion gap: 7 (ref 5–15)
BUN: 19 mg/dL (ref 6–20)
CO2: 24 mmol/L (ref 22–32)
Calcium: 8.6 mg/dL — ABNORMAL LOW (ref 8.9–10.3)
Chloride: 102 mmol/L (ref 98–111)
Creatinine, Ser: 1.49 mg/dL — ABNORMAL HIGH (ref 0.61–1.24)
GFR, Estimated: 54 mL/min — ABNORMAL LOW (ref >60)
Glucose, Bld: 105 mg/dL — ABNORMAL HIGH (ref 70–99)
Potassium: 4.8 mmol/L (ref 3.5–5.1)
Sodium: 133 mmol/L — ABNORMAL LOW (ref 135–145)
Total Bilirubin: 0.4 mg/dL (ref 0.3–1.2)
Total Protein: 7.5 g/dL (ref 6.5–8.1)

## 2022-09-13 LAB — GLUCOSE, CAPILLARY: Glucose-Capillary: 96 mg/dL (ref 70–99)

## 2022-09-13 MED ORDER — BANATROL TF EN LIQD
60.0000 mL | Freq: Three times a day (TID) | ENTERAL | Status: DC
  Administered 2022-09-13 – 2022-09-16 (×10): 60 mL
  Filled 2022-09-13 (×11): qty 60

## 2022-09-13 NOTE — Procedures (Signed)
Cortrak  Person Inserting Tube:  Kendell Bane C, RD Tube Type:  Cortrak - 55 inches Tube Size:  10 Tube Location:  Left nare Secured by: Bridle Technique Used to Measure Tube Placement:  Marking at nare/corner of mouth Cortrak Secured At:  94 cm   Cortrak Tube Team Note:  Consult received to place a Cortrak feeding tube.   X-ray is required, abdominal x-ray has been ordered by the Cortrak team. Please confirm tube placement before using the Cortrak tube.   If the tube becomes dislodged please keep the tube and contact the Cortrak team at [email or web address] for replacement.  If after hours and replacement cannot be delayed, place a NG tube and confirm placement with an abdominal x-ray.    Cammy Copa., RD, LDN, CNSC See AMiON for contact information

## 2022-09-13 NOTE — TOC Progression Note (Signed)
Transition of Care [facility]) - Progression Note    Patient Details  Name: Samuel Blevins MRN: [id number] Date of Birth: 06/28/1965  [facility]) CM/SW Contact  Lockie Pares, RN Phone Number: 09/13/2022, 11:14 AM  Clinical Narrative:    Patient tried on food, but not successful after he pulled out his cortrak. He also had medications of unknown origin and cigarettes at the bedside, which he ate one cigarette, witnessed by nursing. Has continued to need paracentesis. Will still need ERCP.  TOC will continue to follow    Expected Discharge Plan: Homeless Shelter Barriers to Discharge: Continued Medical Work up  Expected Discharge Plan and Services In-house Referral: Clinical Social Work     Living arrangements for the past 2 months: Homeless, Homeless Shelter                                       Social Determinants of Health (SDOH) Interventions SDOH Screenings   Food Insecurity: Food Insecurity Present (08/26/2022)  Housing: Medium Risk (08/26/2022)  Transportation Needs: Unmet Transportation Needs (08/26/2022)  Utilities: Not At Risk (08/26/2022)  Depression (PHQ2-9): High Risk (02/14/2022)  Financial Resource Strain: Not on File (06/14/2021)   Received from [place], [place]  Physical Activity: Not on File (06/14/2021)   Received from [place], [place]  Social Connections: Not on File (06/14/2021)   Received from [place], [place]  Stress: Not on File (06/14/2021)   Received from [place], [place]  Tobacco Use: High Risk (08/26/2022)    Readmission Risk Interventions    09/04/2022   11:40 AM  Readmission Risk Prevention Plan  Transportation Screening Complete  Medication Review (RN Care Manager) Complete  PCP or Specialist appointment within 3-5 days of discharge Complete  HRI or Home Care Consult Complete  SW Recovery Care/Counseling Consult Complete  Palliative Care Screening Not Applicable  Skilled Nursing Facility Not Applicable

## 2022-09-13 NOTE — Progress Notes (Addendum)
[facility] Progress Note  CC:  Chronic pancreatitis, pancreatic duct disruption     Subjective: He continues to have generalized abdominal pain, more pronounced to the right mid abdomen. No N/V. He ate most of his breakfast, did not eat lunch. He pulled out Core trak tube yesterday, was replaced today and tube feedings were restarted. RN observed him eating his cigarettes yesterday. No diarrhea today. He passed a nonbloody brown pile of muddy stool this afternoon. No black stool or red blood per the rectum.    Objective:  Vital signs in last 24 hours: Temp:  [98.3 F (36.8 C)-99.5 F (37.5 C)] 98.5 F (36.9 C) (07/19 1300) Pulse Rate:  [97-107] 102 (07/19 1300) Resp:  [14-22] 20 (07/19 1300) BP: (121-142)/(78-91) 142/84 (07/19 1300) SpO2:  [96 %-98 %] 98 % (07/19 1300) Weight:  [70.1 kg] 70.1 kg (07/19 0415) Last BM Date : 09/12/22 General: Alert chronically ill appearing male in NAD.  Heart: RRR, no murmur.  Pulm: Breath sounds clear throughout.  Abdomen: Distended. Tender throughout without rebound or guarding. Positive bowel sounds x 4 quads.  Extremities:  No edema.  Neurologic:  Alert and  oriented x 4. Moves all extremities. Answers questions appropriately.  Psych:  Alert and cooperative. Normal mood and affect.  Intake/Output from previous day: 07/18 0701 - 07/19 0700 In: 920.1 [I.V.:10; NG/GT:846.1; IV Piggyback:64] Out: 920 [Urine:920] Intake/Output this shift: No intake/output data recorded.  Lab Results: Recent Labs    09/12/22 0117 09/13/22 0708  WBC 20.4* 17.1*  HGB 7.5* 7.1*  HCT 23.5* 21.3*  PLT 795* 782*   BMET Recent Labs    09/11/22 0014 09/12/22 0117 09/13/22 0708  NA 133* 135 133*  K 4.1 4.1 4.8  CL 101 102 102  CO2 24 24 24   GLUCOSE 119* 145* 105*  BUN 26* 26* 19  CREATININE 2.53* 1.86* 1.49*  CALCIUM 8.1* 8.3* 8.6*   LFT Recent Labs    09/13/22 0708  PROT 7.5  ALBUMIN 2.3*  AST 36  ALT 29  ALKPHOS 158*   BILITOT 0.4   PT/INR No results for input(s): "LABPROT", "INR" in the last 72 hours. Hepatitis Panel No results for input(s): "HEPBSAG", "HCVAB", "HEPAIGM", "HEPBIGM" in the last 72 hours.  DG Abd Portable 1V  Result Date: 09/13/2022 CLINICAL DATA:  [id number] Encounter for feeding tube placement [id number] EXAM: PORTABLE ABDOMEN - 1 VIEW COMPARISON:  09/02/2022. FINDINGS: The bowel gas pattern is non-obstructive. No evidence of pneumoperitoneum. No acute osseous abnormalities. Enteric tube is seen with its tip overlying the left paramedian lower abdomen, likely in the region of duodenal jejunal junction. There is at least small right pleural effusion, new since the prior study. IMPRESSION: 1. Enteric tube tip likely in the region of the duodenal jejunal junction. 2. At least small right pleural effusion, new since the prior study. Electronically Signed   By: Jules Schick M.D.   On: 09/13/2022 10:27     Brief Narrative:  Samuel Blevins is a 57 y.o. year old male with chronic pancreatitis, etoh abuse, admitted with persistent / recurrent infected peritonitis. Previously, SAAG was not compatible with portal hypertension which now makes since as this is pancreatic ascites with large amount of amylase in ascitic fluid.  However he could still have cirrhosis as imaging this admission coincidentally does show a"nodular" contour of fatty liver. More recent imaging (MRI) shows only hepatomegaly and steatosis.  Assessment / Plan:  Chronic pancreatitis, now with infected pancreatic ascites/pancreatic duct disruption.  S/P paracentesis 09/06/2022, 1.7 L of peritoneal fluid removed cell count consistent with peritonitis. Peritoneal fluid cultures positive for Enterococcus faecium.  On Daptomycin.  -Patient will need to transfer to [facility] or [facility] for ERCP and PD stent as our advanced biliary endoscopist does not having availability to perform these procedures at this time. -If there is no bed  availability at any other facility, he will have to follow up as outpatient once he is stable to be discharged  -Continue Octreotide 200 mg SQ BID to decrease pancreatic secretions -Continue Cortrak (post-pyloric) feedings -ID following. WBC improving. WBC 20.4 -> 17.1.  -Continue Creon 12K units TID -Contnur Pantoprazole 40mg  every day -CBC in a.m. -Continue tube feedings until patient tolerates increased oral intake -Await further recommendations per Dr. Lavon Paganini  Normocytic anemia of chronic disease. Hg 7.5 -> 7.1. No overt GI bleeding.   Thrombocytosis, likely due to acute phase reactant/inflammatory process from pancreatitis.  PLT 782.    Elevated LFTs. Questionable cirrhosis. MRI showed hepatomegaly with hepatic steatosis.  Recommend outpatient follow up   AKI. Cr 3.07 -> 2.53 -> 1.86 -> 1.49. -Nephrology following   C-diff. No diarrhea today, passed a mud like stool this afternoon.  -Continue oral Vancomycin    Hyponatremia. NA+ 133.     Principal Problem:   Sepsis (HCC) Active Problems:   Alcohol dependence syndrome (HCC)   Pseudocyst of pancreas due to acute pancreatitis   Alcoholic pancreatitis   Acute on chronic pancreatitis (HCC)   Pancreatitis   Acute pancreatitis   Other ascites   Protein-calorie malnutrition, severe   Alcoholic liver disease (HCC)   Pancreatic ascites   Anasarca   SIRS (systemic inflammatory response syndrome) (HCC)   Infected ascites   Enteritis due to Clostridium difficile   Peritonitis (HCC)   Bacterial peritonitis (HCC)     LOS: 17 days   Samuel Blevins  09/13/2022, 2:45 PM   Attending physician's note   I have taken a history, reviewed the chart and examined the patient. I performed a substantive portion of this encounter, including complete performance of at least one of the key components, in conjunction with the APP. I agree with the APP's note, impression and recommendations.   VRE bacterial peritonitis on  Daptomycin Awaiting transfer for ERCP for PD stent placement for disruption of PD     K. Scherry Ran , MD [phone]

## 2022-09-13 NOTE — Plan of Care (Signed)
  Problem: Fluid Volume: Goal: Hemodynamic stability will improve Outcome: Progressing   Problem: Clinical Measurements: Goal: Diagnostic test results will improve Outcome: Progressing Goal: Signs and symptoms of infection will decrease Outcome: Progressing   Problem: Education: Goal: Knowledge of General Education information will improve Description: Including pain rating scale, medication(s)/side effects and non-pharmacologic comfort measures Outcome: Progressing   Problem: Health Behavior/Discharge Planning: Goal: Ability to manage health-related needs will improve Outcome: Progressing   Problem: Clinical Measurements: Goal: Ability to maintain clinical measurements within normal limits will improve Outcome: Progressing Goal: Will remain free from infection Outcome: Progressing Goal: Diagnostic test results will improve Outcome: Progressing Goal: Respiratory complications will improve Outcome: Progressing Goal: Cardiovascular complication will be avoided Outcome: Progressing   Problem: Activity: Goal: Risk for activity intolerance will decrease Outcome: Progressing   Problem: Nutrition: Goal: Adequate nutrition will be maintained Outcome: Progressing   Problem: Coping: Goal: Level of anxiety will decrease Outcome: Progressing   Problem: Elimination: Goal: Will not experience complications related to bowel motility Outcome: Progressing Goal: Will not experience complications related to urinary retention Outcome: Progressing   Problem: Pain Managment: Goal: General experience of comfort will improve Outcome: Progressing   Problem: Safety: Goal: Ability to remain free from injury will improve Outcome: Progressing   Problem: Skin Integrity: Goal: Risk for impaired skin integrity will decrease Outcome: Progressing   Problem: Education: Goal: Ability to describe self-care measures that may prevent or decrease complications (Diabetes Survival Skills  Education) will improve Outcome: Progressing Goal: Individualized Educational Video(s) Outcome: Progressing   Problem: Coping: Goal: Ability to adjust to condition or change in health will improve Outcome: Progressing   Problem: Fluid Volume: Goal: Ability to maintain a balanced intake and output will improve Outcome: Progressing   Problem: Health Behavior/Discharge Planning: Goal: Ability to identify and utilize available resources and services will improve Outcome: Progressing Goal: Ability to manage health-related needs will improve Outcome: Progressing   Problem: Metabolic: Goal: Ability to maintain appropriate glucose levels will improve Outcome: Progressing   Problem: Nutritional: Goal: Maintenance of adequate nutrition will improve Outcome: Progressing Goal: Progress toward achieving an optimal weight will improve Outcome: Progressing   Problem: Skin Integrity: Goal: Risk for impaired skin integrity will decrease Outcome: Progressing   Problem: Tissue Perfusion: Goal: Adequacy of tissue perfusion will improve Outcome: Progressing

## 2022-09-13 NOTE — Plan of Care (Signed)
  Problem: Fluid Volume: Goal: Hemodynamic stability will improve Outcome: Not Progressing   Problem: Clinical Measurements: Goal: Diagnostic test results will improve Outcome: Not Progressing Goal: Signs and symptoms of infection will decrease Outcome: Not Progressing   Problem: Education: Goal: Knowledge of General Education information will improve Description: Including pain rating scale, medication(s)/side effects and non-pharmacologic comfort measures Outcome: Not Progressing   Problem: Health Behavior/Discharge Planning: Goal: Ability to manage health-related needs will improve Outcome: Not Progressing   Problem: Clinical Measurements: Goal: Ability to maintain clinical measurements within normal limits will improve Outcome: Not Progressing Goal: Will remain free from infection Outcome: Not Progressing Goal: Diagnostic test results will improve Outcome: Not Progressing Goal: Respiratory complications will improve Outcome: Not Progressing Goal: Cardiovascular complication will be avoided Outcome: Not Progressing   Problem: Activity: Goal: Risk for activity intolerance will decrease Outcome: Not Progressing   Problem: Nutrition: Goal: Adequate nutrition will be maintained Outcome: Not Progressing   Problem: Coping: Goal: Level of anxiety will decrease Outcome: Not Progressing   Problem: Elimination: Goal: Will not experience complications related to bowel motility Outcome: Not Progressing Goal: Will not experience complications related to urinary retention Outcome: Not Progressing   Problem: Pain Managment: Goal: General experience of comfort will improve Outcome: Not Progressing   Problem: Safety: Goal: Ability to remain free from injury will improve Outcome: Not Progressing   Problem: Skin Integrity: Goal: Risk for impaired skin integrity will decrease Outcome: Not Progressing   Problem: Education: Goal: Ability to describe self-care measures that  may prevent or decrease complications (Diabetes Survival Skills Education) will improve Outcome: Not Progressing Goal: Individualized Educational Video(s) Outcome: Not Progressing   Problem: Coping: Goal: Ability to adjust to condition or change in health will improve Outcome: Not Progressing   Problem: Fluid Volume: Goal: Ability to maintain a balanced intake and output will improve Outcome: Not Progressing   Problem: Health Behavior/Discharge Planning: Goal: Ability to identify and utilize available resources and services will improve Outcome: Not Progressing Goal: Ability to manage health-related needs will improve Outcome: Not Progressing   Problem: Metabolic: Goal: Ability to maintain appropriate glucose levels will improve Outcome: Not Progressing   Problem: Nutritional: Goal: Maintenance of adequate nutrition will improve Outcome: Not Progressing Goal: Progress toward achieving an optimal weight will improve Outcome: Not Progressing   Problem: Skin Integrity: Goal: Risk for impaired skin integrity will decrease Outcome: Not Progressing   Problem: Tissue Perfusion: Goal: Adequacy of tissue perfusion will improve Outcome: Not Progressing

## 2022-09-13 NOTE — Progress Notes (Signed)
PROGRESS NOTE  Samuel Blevins TKZ:[id number] DOB: 1966/01/02 DOA: 08/26/2022 PCP: [name], MD  HPI/Recap of past 64 hours:  57 year old with history of recurrent pancreatitis, chronic pancreatic insufficiency, HTN , recurrent alcohol use comes to the hospital with complaints of abdominal pain, nausea, vomiting and diarrhea.  Patient was also found to have acute recurrent alcoholic pancreatitis.  Initially patient had left AMA about 4-5 days prior to this admission.  Patient readmitted for acute recurrent alcoholic pancreatitis with worsening ascites, initial labs of ascites fluid indicate elevated amylase concerning for pancreatic duct disruption.  Hospital course complicated with presence of recurrent ascites, infected ascites fluid, ongoing abdominal pain and acute renal failure.  Followed by GI and nephrology.  Now with infectious disease.  GI now recommending transfer to outside facility for ERCP due to pancreatic duct disruption.  Call made to [facility] with no beds available, however currently in discussion with GI procedures to see if patient may go for ERCP procedure and then come back.  7/18 He removed his core track, replaced on 7/19.   09/13/2022: Seen at bedside.  Diarrhea is better, less frequency and becoming mushy, per the patient.  Has abdominal pain 9/10.  Analgesics in place.  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  Source presumed pancreatic duct disruption(elevated amylase on paracentesis) and presumed SBP. -Paracentesis 7/1 - 3L; 7/5 - 3L; 7/8 2L, 7/12, 1.7 L. 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  Now remains on daptomycin.  Followed by ID. -Urine culture shows multiple species, likely contaminant -Remains on octreotide. Poor prognosis.   Improving C. difficile colitis: Patient also started having diarrhea on 09/06/2022, stool was tested positive for C. difficile, started on p.o. vancomycin by GI.  Diarrhea improved.  Continue p.o. vancomycin until he is on antibiotics and will need further as recommended by ID.   Acute hypoxic respiratory failure: Overnight on the night of 09/05/2022, patient went into respiratory distress, tachycardia and tachypnea and moved to progressive unit.  Chest x-ray was done which ruled out acute pathology but he did have some atelectasis, likely secondary to enlarging ascites.  Patient felt better after paracentesis.  Currently he is stable and comfortable on room air.   Recurrent alcohol induced pancreatitis. Nausea vomiting, resolved -Secondary to chronic alcohol use/abuse p.o. intake as tolerated, pain control avoiding IV narcotics, antiemetics, PPI   Severe protein caloric malnutrition High risk for refeeding syndrome -Complicated by chronic alcohol use -Core track placed 7/5 -dysfunctional  7/6 -replaced  09/02/2022, noted to be distal to the proximal duodenum. -Continue to follow electrolytes, mag/Phos.  Repleted as appropriate -Taking oral nutrition without excessive pain.  Discontinue tube feeding today and advance to soft diet.  If eating adequate, discontinue core track tomorrow.   Alcohol abuse - Alcohol withdrawal protocol completed, currently outside the window for any further withdrawal symptoms.  No further need for benzos IV versus p.o.    Essential hypertension -Currently well-controlled off medications, as needed hydralazine for hypertensive events   GERD/duodenitis -Continue PPI   Acute kidney injury with hyperkalemia: Followed by nephrology.  Treated with Lokelma.  Urine output is adequate.  Slight improvement today.   Anemia of chronic disease: Hemoglobin dropping gradually, 7.3 today.  May benefit with iron transfusion, will defer to nephrology.   DVT prophylaxis: enoxaparin (LOVENOX) injection 30 mg Start: 09/07/22 1700   Code Status: Full Code  Family Communication:  None present at bedside.  Plan of care discussed with patient in length and he/she verbalized understanding and agreed with it.   Status is: Inpatient Remains inpatient appropriate because: Needs ERCP, timing to be determined by GI.  GI may also transfer him to [facility].  Now on IV antibiotics.  Will need IV antibiotic plans.     Estimated body mass index is 23.05 kg/m as calculated from the following:   Height as of this encounter: 5\' 9"  (1.753 m).   Weight as of this encounter: 70.8 kg.   Nutritional Assessment: Body mass index is 23.05 kg/m.Marland Kitchen Seen by dietician.  I agree with the assessment and plan as outlined below: Nutrition Status: Nutrition Problem: Severe Malnutrition Etiology: chronic illness (pancreatitis) Signs/Symptoms: severe fat depletion, severe muscle depletion, moderate fat depletion, moderate muscle depletion Interventions: Refer to RD note for recommendations   . Skin  Assessment: I have examined the patient's skin and I agree with the wound assessment as performed by the wound care RN as outlined below:   Consultants:  GI Nephrology s/o Infectious disease   Procedures:  As above   Objective: Vitals:   09/12/22 2330 09/13/22 0415 09/13/22 0808 09/13/22 1300  BP: 125/87 (!) 139/91 128/79 (!) 142/84  Pulse: 100 (!) 101 97 (!) 102  Resp: 14 17 (!) 22 20  Temp: 98.3 F (36.8 C) 98.3 F (36.8 C) 98.8 F (37.1 C) 98.5 F (36.9 C)  TempSrc: Oral Oral Oral Oral  SpO2:  98% 96% 98%  Weight:  70.1 kg    Height:        Intake/Output Summary (Last 24 hours) at 09/13/2022 1525 Last data filed at 09/13/2022 0417 Gross per 24 hour  Intake 474.59 ml  Output 470 ml  Net 4.59 ml   Filed Weights   09/11/22 0500 09/12/22 0424 09/13/22 0415  Weight: 70.8 kg 70.4 kg 70.1 kg    Exam:  General: 57 y.o. year-old male well developed well nourished in no acute distress.  Alert and oriented x3. Cardiovascular: Regular rate and rhythm with no rubs or gallops.  No thyromegaly or JVD noted.   Respiratory: Clear to auscultation with no wheezes or rales. Good inspiratory effort. Abdomen: Soft mildly distended with normal bowel sounds. Musculoskeletal: No lower extremity edema. 2/4 pulses in all 4 extremities. Skin: No ulcerative lesions noted or rashes, Psychiatry: Mood is appropriate for condition and setting   Data Reviewed: CBC: Recent Labs  Lab 09/08/22 0515 09/09/22 0435 09/10/22 0017 09/12/22 0117 09/13/22 0708  WBC 24.8* 20.4* 18.1* 20.4* 17.1*  NEUTROABS  --   --  14.9*  --   --   HGB 7.5* 7.2* 7.3* 7.5* 7.1*  HCT 22.4* 22.1* 22.3* 23.5* 21.3*  MCV 84.2 86.7 86.4 85.8 85.9  PLT 739* 717* 732* 795* 782*   Basic Metabolic Panel: Recent Labs  Lab 09/07/22 0231 09/07/22 1425 09/08/22 0515 09/08/22 6045 09/09/22 0435 09/10/22 0017 09/11/22 0014 09/12/22 0117 09/13/22 0708  NA 130*   < >  --    < > 132* 132* 133* 135 133*  K 5.8*   <  >  --    < > 4.5 3.9 4.1 4.1 4.8  CL 98   < >  --    < > 97* 100 101 102 102  CO2 24   < >  --    < > 24 21* 24 24 24   GLUCOSE 105*   < >  --    < > 91 125* 119* 145* 105*  BUN 32*   < >  --    < > 33* 30* 26* 26* 19  CREATININE 2.91*   < >  --    < > 3.52* 3.07* 2.53* 1.86* 1.49*  CALCIUM 8.1*   < >  --    < > 8.3* 8.2* 8.1* 8.3* 8.6*  MG 2.3  --  2.5*  --  2.2 2.3  --  2.0  --   PHOS 4.2  --   --   --  5.1* 4.4  --   --   --    < > = values in this interval not displayed.   GFR: Estimated Creatinine Clearance: 53.6 mL/min (A) (by C-G formula based on SCr of 1.49 mg/dL (H)). Liver Function Tests: Recent Labs  Lab 09/08/22 0922 09/09/22 0435 09/10/22 0017 09/11/22 0014 09/12/22 0117 09/13/22 0708  AST 25  --  28 45* 40 36  ALT 14  --  18 31 30 29   ALKPHOS 85  --  132* 216* 212* 158*  BILITOT 0.8  --  0.5 0.7 0.5 0.4  PROT 7.6  --  7.4 7.6 7.8 7.5  ALBUMIN 2.4* 2.9* 2.7* 2.6* 2.5* 2.3*   No results for input(s): "LIPASE", "AMYLASE" in the last 168 hours. No results for input(s): "AMMONIA" in the last 168 hours. Coagulation Profile: No results for input(s): "INR", "PROTIME" in the last 168 hours. Cardiac Enzymes: Recent Labs  Lab 09/08/22 1636  CKTOTAL 69   BNP (last 3 results) No results for input(s): "PROBNP" in the last 8760 hours. HbA1C: No results for input(s): "HGBA1C" in the last 72 hours. CBG: Recent Labs  Lab 09/11/22 2330 09/12/22 0420 09/12/22 2020 09/12/22 2327 09/13/22 0407  GLUCAP 120* 135* 93 101* 96   Lipid Profile: No results for input(s): "CHOL", "HDL", "LDLCALC", "TRIG", "CHOLHDL", "LDLDIRECT" in the last 72 hours. Thyroid Function Tests: No results for input(s): "TSH", "T4TOTAL", "FREET4", "T3FREE", "THYROIDAB" in the last 72 hours. Anemia Panel: No results for input(s): "VITAMINB12", "FOLATE", "FERRITIN", "TIBC", "IRON", "RETICCTPCT" in the last 72 hours. Urine analysis:    Component Value Date/Time   COLORURINE YELLOW 09/08/2022 1652    APPEARANCEUR CLOUDY (A) 09/08/2022 1652   LABSPEC 1.016 09/08/2022 1652   PHURINE 5.0 09/08/2022 1652   GLUCOSEU NEGATIVE 09/08/2022 1652   HGBUR NEGATIVE 09/08/2022 1652   BILIRUBINUR NEGATIVE 09/08/2022 1652   KETONESUR NEGATIVE 09/08/2022 1652   PROTEINUR 100 (A) 09/08/2022 1652   NITRITE NEGATIVE 09/08/2022 1652   LEUKOCYTESUR LARGE (A) 09/08/2022 1652   Sepsis Labs: @LABRCNTIP (procalcitonin:4,lacticidven:4)  ) Recent Results (  from the past 240 hour(s))  Culture, body fluid w Gram Stain-bottle     Status: Abnormal   Collection Time: 09/06/22  9:18 AM   Specimen: Peritoneal Washings  Result Value Ref Range Status   Specimen Description PERITONEAL  Final   Special Requests NONE  Final   Gram Stain   Final    GRAM POSITIVE COCCI IN CHAINS IN BOTH AEROBIC AND ANAEROBIC BOTTLES CRITICAL RESULT CALLED TO, READ BACK BY AND VERIFIED WITH: RN YOKO IMAI  ON 09/06/22 @ 1738 BY DRT Performed at [facility], [address], [place], [place] [address]    Culture (A)  Final    ENTEROCOCCUS FAECIUM VANCOMYCIN RESISTANT ENTEROCOCCUS    Report Status 09/08/2022 FINAL  Final   Organism ID, Bacteria ENTEROCOCCUS FAECIUM  Final      Susceptibility   Enterococcus faecium - MIC*    AMPICILLIN >=32 RESISTANT Resistant     VANCOMYCIN >=32 RESISTANT Resistant     GENTAMICIN SYNERGY SENSITIVE Sensitive     * ENTEROCOCCUS FAECIUM  Gram stain     Status: None   Collection Time: 09/06/22  9:18 AM   Specimen: Peritoneal Washings  Result Value Ref Range Status   Specimen Description PERITONEAL  Final   Special Requests NONE  Final   Gram Stain   Final    ABUNDANT WBC PRESENT, PREDOMINANTLY PMN NO ORGANISMS SEEN Performed at [facility], [address] [place], [place] [address]    Report Status 09/06/2022 FINAL  Final  C Difficile Quick Screen (NO PCR Reflex)     Status: Abnormal   Collection Time: 09/06/22 10:47 AM   Specimen: STOOL  Result Value Ref Range Status   C  Diff antigen POSITIVE (A) NEGATIVE Final   C Diff toxin POSITIVE (A) NEGATIVE Final    Comment: CRITICAL RESULT CALLED TO, READ BACK BY AND VERIFIED WITH:  09/06/2022 1047 RN ROOSEVELT    C Diff interpretation Toxin producing C. difficile detected.  Final    Comment: CRITICAL RESULT CALLED TO, READ BACK BY AND VERIFIED WITH:  09/06/2022 1047 RN ROOSEVELT Performed at [facility], [address] [place], [place] [address]       Studies: DG Abd Portable 1V  Result Date: 09/13/2022 CLINICAL DATA:  [id number] Encounter for feeding tube placement [id number] EXAM: PORTABLE ABDOMEN - 1 VIEW COMPARISON:  09/02/2022. FINDINGS: The bowel gas pattern is non-obstructive. No evidence of pneumoperitoneum. No acute osseous abnormalities. Enteric tube is seen with its tip overlying the left paramedian lower abdomen, likely in the region of duodenal jejunal junction. There is at least small right pleural effusion, new since the prior study. IMPRESSION: 1. Enteric tube tip likely in the region of the duodenal jejunal junction. 2. At least small right pleural effusion, new since the prior study. Electronically Signed   By: Jules Schick M.D.   On: 09/13/2022 10:27    Scheduled Meds:  acidophilus  2 capsule Oral TID   enoxaparin (LOVENOX) injection  40 mg Subcutaneous QPM   feeding supplement  237 mL Oral BID BM   feeding supplement (PROSource TF20)  60 mL Per Tube Daily   fiber supplement (BANATROL TF)  60 mL Per Tube TID   folic acid  1 mg Oral Daily   gabapentin  300 mg Oral QHS   lidocaine  1 patch Transdermal Q24H   lipase/protease/amylase  12,000 Units Oral TID AC   multivitamin with minerals  1 tablet Oral Daily  octreotide  200 mcg Subcutaneous Q12H   pantoprazole  40 mg Oral BID AC   sodium chloride flush  10-40 mL Intracatheter Q12H   thiamine  100 mg Oral Daily   vancomycin  125 mg Oral Q6H    Continuous Infusions:  DAPTOmycin (CUBICIN) 700 mg in sodium chloride 0.9 % IVPB 700 mg  (09/13/22 1420)   feeding supplement (VITAL 1.5 CAL) Stopped (09/12/22 1901)     LOS: 17 days     Darlin Drop, MD [facility] Pager [phone]  If 7PM-7AM, please contact night-coverage [email or web address] Password [facility] 09/13/2022, 3:25 PM

## 2022-09-13 NOTE — Progress Notes (Addendum)
Mobility Specialist: Progress Note   09/13/22 1511  Mobility  Activity Ambulated with assistance in hallway  Level of Assistance Standby assist, set-up cues, supervision of patient - no hands on  Assistive Device Four wheel walker  Distance Ambulated (ft) 125 ft  Activity Response Tolerated well  Mobility Referral Yes  $Mobility charge 1 Mobility  Mobility Specialist Start Time (ACUTE ONLY) 1500  Mobility Specialist Stop Time (ACUTE ONLY) 1510  Mobility Specialist Time Calculation (min) (ACUTE ONLY) 10 min   Pt was agreeable to mobility session. Denies any SOB but c/o of abdominal pain when taking deep breath - rated the pain a 10. Pain did not affect ambulation or gait during the session. Left in bed with all needs met.    Maurene Capes Mobility Specialist Please contact via SecureChat or Rehab office at [phone]

## 2022-09-14 DIAGNOSIS — A0472 Enterocolitis due to Clostridium difficile, not specified as recurrent: Secondary | ICD-10-CM

## 2022-09-14 DIAGNOSIS — A419 Sepsis, unspecified organism: Secondary | ICD-10-CM | POA: Diagnosis not present

## 2022-09-14 DIAGNOSIS — K860 Alcohol-induced chronic pancreatitis: Secondary | ICD-10-CM

## 2022-09-14 DIAGNOSIS — K852 Alcohol induced acute pancreatitis without necrosis or infection: Secondary | ICD-10-CM | POA: Diagnosis not present

## 2022-09-14 DIAGNOSIS — R6520 Severe sepsis without septic shock: Secondary | ICD-10-CM

## 2022-09-14 DIAGNOSIS — K863 Pseudocyst of pancreas: Secondary | ICD-10-CM

## 2022-09-14 DIAGNOSIS — K859 Acute pancreatitis without necrosis or infection, unspecified: Secondary | ICD-10-CM | POA: Diagnosis not present

## 2022-09-14 DIAGNOSIS — K659 Peritonitis, unspecified: Secondary | ICD-10-CM

## 2022-09-14 DIAGNOSIS — R601 Generalized edema: Secondary | ICD-10-CM

## 2022-09-14 LAB — BASIC METABOLIC PANEL
Anion gap: 10 (ref 5–15)
BUN: 13 mg/dL (ref 6–20)
CO2: 22 mmol/L (ref 22–32)
Calcium: 8.4 mg/dL — ABNORMAL LOW (ref 8.9–10.3)
Chloride: 101 mmol/L (ref 98–111)
Creatinine, Ser: 1.17 mg/dL (ref 0.61–1.24)
GFR, Estimated: >60 mL/min (ref >60)
Glucose, Bld: 116 mg/dL — ABNORMAL HIGH (ref 70–99)
Potassium: 3.8 mmol/L (ref 3.5–5.1)
Sodium: 133 mmol/L — ABNORMAL LOW (ref 135–145)

## 2022-09-14 LAB — CBC
HCT: 21.6 % — ABNORMAL LOW (ref 39.0–52.0)
Hemoglobin: 7 g/dL — ABNORMAL LOW (ref 13.0–17.0)
MCH: 27.6 pg (ref 26.0–34.0)
MCHC: 32.4 g/dL (ref 30.0–36.0)
MCV: 85 fL (ref 80.0–100.0)
Platelets: 817 10*3/uL — ABNORMAL HIGH (ref 150–400)
RBC: 2.54 MIL/uL — ABNORMAL LOW (ref 4.22–5.81)
RDW: 21.6 % — ABNORMAL HIGH (ref 11.5–15.5)
WBC: 18.4 10*3/uL — ABNORMAL HIGH (ref 4.0–10.5)
nRBC: 0 % (ref 0.0–0.2)

## 2022-09-14 LAB — HEMOGLOBIN AND HEMATOCRIT, BLOOD
HCT: 22.7 % — ABNORMAL LOW (ref 39.0–52.0)
Hemoglobin: 7.4 g/dL — ABNORMAL LOW (ref 13.0–17.0)

## 2022-09-14 LAB — GLUCOSE, CAPILLARY: Glucose-Capillary: 111 mg/dL — ABNORMAL HIGH (ref 70–99)

## 2022-09-14 NOTE — Progress Notes (Addendum)
Pharmacy Antibiotic Note  Samuel Blevins is a 57 y.o. male admitted on 08/26/2022 with decompensated liver cirrhosis, ascites and VRE peritonitis. Pharmacy has been consulted for daptomycin dosing. Also with C diff on oral vancomycin.  Scr improving now CrCl 68.1 mL/min.  Plan: Continue Daptomycin 700 mg IV q24h (10 mg/kg) Monitor renal function, clinical progress, cultures/sensitivities F/U LOT per ID, next CK ordered for 7/22  Height: 5\' 9"  (175.3 cm) Weight: 70 kg (154 lb 5.2 oz) IBW/kg (Calculated) : 70.7  Temp (24hrs), Avg:98.6 F (37 C), Min:98.1 F (36.7 C), Max:99 F (37.2 C)  Recent Labs  Lab 09/09/22 0435 09/10/22 0017 09/11/22 0014 09/12/22 0117 09/13/22 0708 09/14/22 0338  WBC 20.4* 18.1*  --  20.4* 17.1* 18.4*  CREATININE 3.52* 3.07* 2.53* 1.86* 1.49* 1.17    Estimated Creatinine Clearance: 68.1 mL/min (by C-G formula based on SCr of 1.17 mg/dL).    No Known Allergies  Antimicrobials this admission: Cefepime 7/1 Zosyn 7/12 >> 7/14 Vanc PO (C diff +) 7/12 >> (7/26, potentially longer depending of DOT of dapto) Dapto 7/14 >>    7/14 CK: 69  Microbiology results: 7/1- Bcx- negF 7/1- peritoneal fluid cx- negF 7/12 C diff + 7/12 Peritoneal washing - VRE   Thank you for involving pharmacy in this patient's care.  Enos Fling, PharmD PGY-1 Acute Care Pharmacy Resident 09/14/2022 11:10 AM

## 2022-09-14 NOTE — Progress Notes (Signed)
PROGRESS NOTE    Samuel Blevins  SWF:[id number] DOB: 1965/07/02 DOA: 08/26/2022 PCP: [name], MD   Brief Narrative:  57 year old with history of recurrent pancreatitis, chronic pancreatic insufficiency, HTN , recurrent alcohol use comes to the hospital with complaints of abdominal pain, nausea, vomiting and diarrhea.  Patient was also found to have acute recurrent alcoholic pancreatitis.  Initially patient had left AMA about 4-5 days prior to this admission.  Patient readmitted for acute recurrent alcoholic pancreatitis with worsening ascites, initial labs of ascites fluid indicate elevated amylase concerning for pancreatic duct disruption.    Hospital course complicated with presence of recurrent ascites, infected ascites fluid, ongoing abdominal pain and acute renal failure.  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 Being followed by GI, nephrology, and ID.   GI now recommending transfer to outside facility for ERCP to attempt to stent  pancreatic duct disruption. [facility] excepted however no beds are currently available -may be able to transition over for procedure only and transition back to our facility after procedure  Assessment & Plan:   Principal Problem:   Sepsis (HCC) Active Problems:   Alcohol dependence syndrome (HCC)   Pseudocyst of pancreas due to acute pancreatitis   Alcoholic pancreatitis   Acute on chronic pancreatitis (HCC)   Pancreatitis   Acute pancreatitis   Other ascites   Protein-calorie malnutrition, severe   Alcoholic liver disease (HCC)   Pancreatic ascites   Anasarca   SIRS (systemic inflammatory response syndrome) (HCC)   Infected ascites   Enteritis due to Clostridium difficile   Peritonitis (HCC)   Bacterial peritonitis (HCC)  Decompensated liver cirrhosis with ascites Sepsis secondary to SBP versus infected pancreatic  ascites Hypoalbuminemia Hypovolemic hyponatremia -Sepsis evidenced by tachycardia and leukocytosis.  Source presumed pancreatic duct disruption(elevated amylase on paracentesis) and presumed SBP. -Paracentesis 7/1 - 3L; 7/5 - 3L; 7/8 2L, 7/12 1.7 L. Echo: EF 60-65% with normal LVF and grade 1 diastolic dysfunction -Need for ERCP/pancreatic duct stenting at outside facility given questionable pancreatic duct disruption with noted amylase and ascitic fluid -Multiple Cortrak's placed "accidentally" removed by patient -most recent core track replacement 7/19 -Cultures(blood and paracentesis) initially negative however cultures on paracentesis from 7/12 shows vancomycin resistant enterococcus. Now on daptomycin per ID -Urine culture shows multiple species, likely contaminant   Improving C. difficile colitis:  Increased diarrhea 09/06/2022 - stool positive for C. Difficile PO vancomycin will need to continue after completion of dapto per ID.   Acute hypoxic respiratory failure, transient:  Acute episode overnight on the night of 09/05/2022 Chest x-ray reported atelectasis, likely secondary to enlarging ascites. Improved s/p paracentesis   Recurrent alcohol induced pancreatitis. Nausea vomiting, resolved -Secondary to chronic alcohol use/abuse p.o. intake as tolerated, pain control avoiding IV narcotics, antiemetics, PPI   Severe protein caloric malnutrition High risk for refeeding syndrome -Complicated by chronic alcohol use -Core track placed 7/5 -dysfunctional 7/6 -replaced 09/02/2022, noted to be distal to the proximal duodenum. -Continue to follow electrolytes, mag/Phos.  Repleted as appropriate -Taking oral nutrition without excessive pain.  Discontinue tube feeding today and advance to soft diet.  If eating adequate, discontinue core track tomorrow.   Alcohol abuse - Alcohol withdrawal protocol completed, currently outside the window for any further withdrawal symptoms.  No further need  for benzos IV versus p.o.    Essential hypertension -Currently well-controlled off medications, as needed hydralazine for hypertensive events   GERD/duodenitis -Continue PPI  Acute kidney injury with hyperkalemia: Followed by nephrology.  Treated with Lokelma.  Urine output is adequate.  Slight improvement today.   Anemia of chronic disease: Hemoglobin dropping gradually, 7.3 today.  May benefit with iron transfusion, will defer to nephrology.  Noncompliance Acute metabolic/toxic encephalopathy -Multiple episodes of abnormal behavior, continues to pull out core track NG tubes, nursing staff noted patient eating cigarettes earlier this week.  Appears to be alert and oriented more recently, presumed to be secondary to multiple comorbidities as above  DVT prophylaxis: enoxaparin (LOVENOX) injection 40 mg Start: 09/11/22 1800 Code Status:   Code Status: Full Code Family Communication: None present  Status is: Inpatient  Dispo: The patient is from: Home              Anticipated d/c is to: To be determined              Anticipated d/c date is: To be determined              Patient currently not medically stable for discharge  Consultants:  GI, ID, nephrology  Procedures:  Multiple cortrack placements, multiple paracentesis, EEG  Antimicrobials:  Daptomycin, p.o. vancomycin  Subjective: No acute issues or events overnight, patient denies nausea vomiting diarrhea constipation headache fevers chills or chest pain  Objective: Vitals:   09/13/22 2300 09/14/22 0000 09/14/22 0037 09/14/22 0630  BP:   (!) 157/92 (!) 152/91  Pulse:   (!) 106 (!) 109  Resp: 20 (!) 22 (!) 24 20  Temp:   98.9 F (37.2 C) 98.1 F (36.7 C)  TempSrc:   Oral Oral  SpO2:    98%  Weight:    70 kg  Height:        Intake/Output Summary (Last 24 hours) at 09/14/2022 0813 Last data filed at 09/14/2022 0630 Gross per 24 hour  Intake --  Output 450 ml  Net -450 ml   Filed Weights   09/12/22 0424  09/13/22 0415 09/14/22 0630  Weight: 70.4 kg 70.1 kg 70 kg    Examination:  General: Somnolent but easily arousable, resting comfortably in bed oriented x 3 HEENT: Cortrak secured left nare Lungs: Coarse breath sounds bilaterally without overt wheezes rales or rhonchi. Heart: Regular rate rhythm no murmurs rubs or gallops. Abdomen: Diffusely tender, minimally distended but soft without rebound or guarding   LOS: 18 days   Time spent:  Azucena Fallen, DO [facility]  If 7PM-7AM, please contact night-coverage [email or web address]  09/14/2022, 8:13 AM

## 2022-09-14 NOTE — Plan of Care (Signed)
  Problem: Fluid Volume: Goal: Hemodynamic stability will improve Outcome: Progressing   Problem: Clinical Measurements: Goal: Diagnostic test results will improve Outcome: Progressing Goal: Signs and symptoms of infection will decrease Outcome: Progressing   Problem: Education: Goal: Knowledge of General Education information will improve Description: Including pain rating scale, medication(s)/side effects and non-pharmacologic comfort measures Outcome: Progressing   Problem: Health Behavior/Discharge Planning: Goal: Ability to manage health-related needs will improve Outcome: Progressing   Problem: Clinical Measurements: Goal: Ability to maintain clinical measurements within normal limits will improve Outcome: Progressing Goal: Will remain free from infection Outcome: Progressing Goal: Diagnostic test results will improve Outcome: Progressing Goal: Respiratory complications will improve Outcome: Progressing Goal: Cardiovascular complication will be avoided Outcome: Progressing   Problem: Activity: Goal: Risk for activity intolerance will decrease Outcome: Progressing   Problem: Nutrition: Goal: Adequate nutrition will be maintained Outcome: Progressing   Problem: Coping: Goal: Level of anxiety will decrease Outcome: Progressing   Problem: Elimination: Goal: Will not experience complications related to bowel motility Outcome: Progressing Goal: Will not experience complications related to urinary retention Outcome: Progressing   Problem: Pain Managment: Goal: General experience of comfort will improve Outcome: Progressing   Problem: Safety: Goal: Ability to remain free from injury will improve Outcome: Progressing   Problem: Skin Integrity: Goal: Risk for impaired skin integrity will decrease Outcome: Progressing   Problem: Education: Goal: Ability to describe self-care measures that may prevent or decrease complications (Diabetes Survival Skills  Education) will improve Outcome: Progressing Goal: Individualized Educational Video(s) Outcome: Progressing   Problem: Coping: Goal: Ability to adjust to condition or change in health will improve Outcome: Progressing   Problem: Fluid Volume: Goal: Ability to maintain a balanced intake and output will improve Outcome: Progressing   Problem: Health Behavior/Discharge Planning: Goal: Ability to identify and utilize available resources and services will improve Outcome: Progressing Goal: Ability to manage health-related needs will improve Outcome: Progressing   Problem: Metabolic: Goal: Ability to maintain appropriate glucose levels will improve Outcome: Progressing   Problem: Nutritional: Goal: Maintenance of adequate nutrition will improve Outcome: Progressing Goal: Progress toward achieving an optimal weight will improve Outcome: Progressing   Problem: Skin Integrity: Goal: Risk for impaired skin integrity will decrease Outcome: Progressing   Problem: Tissue Perfusion: Goal: Adequacy of tissue perfusion will improve Outcome: Progressing

## 2022-09-15 ENCOUNTER — Inpatient Hospital Stay (HOSPITAL_COMMUNITY): Payer: MEDICAID

## 2022-09-15 DIAGNOSIS — K659 Peritonitis, unspecified: Secondary | ICD-10-CM | POA: Diagnosis not present

## 2022-09-15 DIAGNOSIS — A419 Sepsis, unspecified organism: Secondary | ICD-10-CM | POA: Diagnosis not present

## 2022-09-15 DIAGNOSIS — K859 Acute pancreatitis without necrosis or infection, unspecified: Secondary | ICD-10-CM | POA: Diagnosis not present

## 2022-09-15 DIAGNOSIS — K852 Alcohol induced acute pancreatitis without necrosis or infection: Secondary | ICD-10-CM | POA: Diagnosis not present

## 2022-09-15 MED ORDER — LIDOCAINE HCL (PF) 1 % IJ SOLN
5.0000 mL | Freq: Once | INTRAMUSCULAR | Status: AC
  Administered 2022-09-15: 5 mL via INTRADERMAL

## 2022-09-15 NOTE — Progress Notes (Signed)
PROGRESS NOTE    Samuel Blevins  GYI:[id number] DOB: 05-Jun-1965 DOA: 08/26/2022 PCP: [name], MD   Brief Narrative:  57 year old with history of recurrent pancreatitis, chronic pancreatic insufficiency, HTN , recurrent alcohol use comes to the hospital with complaints of abdominal pain, nausea, vomiting and diarrhea.  Patient was also found to have acute recurrent alcoholic pancreatitis.  Initially patient had left AMA about 4-5 days prior to this admission.  Patient readmitted for acute recurrent alcoholic pancreatitis with worsening ascites, initial labs of ascites fluid indicate elevated amylase concerning for pancreatic duct disruption.    Hospital course complicated with presence of recurrent ascites, infected ascites fluid, ongoing abdominal pain and acute renal failure.  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 Being followed by GI, nephrology, and ID.   GI now recommending transfer to outside facility for ERCP to attempt to stent pancreatic duct disruption. [facility] excepted however no beds are currently available -may be able to transition over for procedure only and transition back to our facility after procedure.  Assessment & Plan:   Principal Problem:   Sepsis (HCC) Active Problems:   Alcohol dependence syndrome (HCC)   Pseudocyst of pancreas due to acute pancreatitis   Alcoholic pancreatitis   Acute on chronic pancreatitis (HCC)   Pancreatitis   Acute pancreatitis   Other ascites   Protein-calorie malnutrition, severe   Alcoholic liver disease (HCC)   Pancreatic ascites   Anasarca   SIRS (systemic inflammatory response syndrome) (HCC)   Infected ascites   Enteritis due to Clostridium difficile   Peritonitis (HCC)   Bacterial peritonitis (HCC)  Decompensated liver cirrhosis with ascites Sepsis secondary to SBP versus infected pancreatic  ascites Hypoalbuminemia Hypovolemic hyponatremia -Sepsis evidenced by tachycardia and leukocytosis.  Source presumed pancreatic duct disruption(elevated amylase on paracentesis) and presumed SBP. -Paracentesis 7/1 - 3L; 7/5 - 3L; 7/8 2L, 7/12 1.7 L. 7/21 600cc Echo: EF 60-65% with normal LVF and grade 1 diastolic dysfunction -Need for ERCP/pancreatic duct stenting at outside facility given questionable pancreatic duct disruption with noted amylase and ascitic fluid -Multiple Cortrak's placed "accidentally" removed by patient -most recent core track replacement 7/19 -Cultures(blood and paracentesis) initially negative however cultures on paracentesis from 7/12 shows vancomycin resistant enterococcus. Now on daptomycin per ID -Urine culture shows multiple species, likely contaminant -Paracentesis 7/21 appears somewhat loculated, with diminishing returns, this is to be expected per discussion with radiology   Improving C. difficile colitis:  Increased diarrhea 09/06/2022 - stool positive for C. Difficile PO vancomycin will need to continue after completion of dapto per ID.   Acute hypoxic respiratory failure, transient:  Acute episode overnight on the night of 09/05/2022 Chest x-ray reported atelectasis, likely secondary to enlarging ascites. Improved s/p paracentesis   Recurrent alcohol induced pancreatitis. Nausea vomiting, resolved -Secondary to chronic alcohol use/abuse p.o. intake as tolerated, pain control avoiding IV narcotics, antiemetics, PPI   Severe protein caloric malnutrition High risk for refeeding syndrome -Complicated by chronic alcohol use -Cortrack placed 7/5 -dysfunctional 7/6 -replaced 09/02/2022, noted to be distal to the proximal duodenum.  Multiple cortrack tubes accidentally versus intentionally dislodged and removed as above. -Continue to follow electrolytes, mag/Phos.  Repleted as appropriate -Taking oral nutrition without excessive pain.  Discontinue tube feeding  today and advance to soft diet.  If eating adequate, discontinue core track tomorrow.   Alcohol abuse - Alcohol withdrawal protocol completed, currently outside the window for any further withdrawal symptoms.  No further need for benzos IV versus p.o.    Essential hypertension -Currently well-controlled off medications, as needed hydralazine for hypertensive events   GERD/duodenitis -Continue PPI   Acute kidney injury with hyperkalemia: Followed by nephrology.  Treated with Lokelma.  Urine output is adequate.  Slight improvement today.   Anemia of chronic disease: Hemoglobin dropping gradually, 7.3 today.  May benefit with iron transfusion, will defer to nephrology.  Noncompliance Acute metabolic/toxic encephalopathy -Multiple episodes of abnormal behavior, continues to pull out core track NG tubes, nursing staff noted patient eating cigarettes earlier this week.  Appears to be alert and oriented more recently, presumed to be secondary to multiple comorbidities as above  DVT prophylaxis: enoxaparin (LOVENOX) injection 40 mg Start: 09/11/22 1800 Code Status:   Code Status: Full Code Family Communication: None present  Status is: Inpatient  Dispo: The patient is from: Home              Anticipated d/c is to: To be determined              Anticipated d/c date is: To be determined              Patient currently not medically stable for discharge  Consultants:  GI, ID, nephrology  Procedures:  Multiple cortrack placements, multiple paracentesis, EEG  Antimicrobials:  Daptomycin, p.o. vancomycin  Subjective: No acute issues or events overnight, patient denies nausea vomiting diarrhea constipation headache fevers chills or chest pain  Objective: Vitals:   09/14/22 1548 09/14/22 2100 09/15/22 0124 09/15/22 0559  BP: (!) 131/90 (!) 161/107 (!) 142/90 (!) 148/92  Pulse: (!) 115 (!) 117 (!) 111 (!) 116  Resp: 20 20 20 20   Temp: 99.3 F (37.4 C) 98.6 F (37 C) 98.4 F (36.9 C)  98.5 F (36.9 C)  TempSrc: Oral Oral Oral Oral  SpO2: 98% 100% 100% 99%  Weight:      Height:        Intake/Output Summary (Last 24 hours) at 09/15/2022 0824 Last data filed at 09/15/2022 0129 Gross per 24 hour  Intake 40 ml  Output 675 ml  Net -635 ml   Filed Weights   09/12/22 0424 09/13/22 0415 09/14/22 0630  Weight: 70.4 kg 70.1 kg 70 kg    Examination:  General: Awake alert, resting comfortably in bed oriented x 3 HEENT: Cortrak secured left nare Lungs: Coarse breath sounds bilaterally without overt wheezes rales or rhonchi. Heart: Regular rate rhythm no murmurs rubs or gallops. Abdomen: Diffusely tender, minimally distended but soft without rebound or guarding   LOS: 19 days   Time spent:  Azucena Fallen, DO [facility]  If 7PM-7AM, please contact night-coverage [email or web address]  09/15/2022, 8:24 AM

## 2022-09-15 NOTE — Procedures (Signed)
PROCEDURE SUMMARY:  Successful US guided paracentesis from left lateral abdomen.  Yielded 600 mL of dark yellow fluid.  No immediate complications.  Pt tolerated well.   Specimen was not sent for labs.  EBL < 5mL  Hoyt Koch PA-C 09/15/2022 1:02 PM

## 2022-09-15 NOTE — Plan of Care (Signed)
  Problem: Fluid Volume: Goal: Hemodynamic stability will improve Outcome: Progressing   Problem: Clinical Measurements: Goal: Diagnostic test results will improve Outcome: Progressing Goal: Signs and symptoms of infection will decrease Outcome: Progressing   Problem: Education: Goal: Knowledge of General Education information will improve Description: Including pain rating scale, medication(s)/side effects and non-pharmacologic comfort measures Outcome: Progressing   Problem: Health Behavior/Discharge Planning: Goal: Ability to manage health-related needs will improve Outcome: Progressing   Problem: Clinical Measurements: Goal: Ability to maintain clinical measurements within normal limits will improve Outcome: Progressing Goal: Will remain free from infection Outcome: Progressing Goal: Diagnostic test results will improve Outcome: Progressing Goal: Respiratory complications will improve Outcome: Progressing Goal: Cardiovascular complication will be avoided Outcome: Progressing   Problem: Activity: Goal: Risk for activity intolerance will decrease Outcome: Progressing   Problem: Nutrition: Goal: Adequate nutrition will be maintained Outcome: Progressing   Problem: Coping: Goal: Level of anxiety will decrease Outcome: Progressing   Problem: Elimination: Goal: Will not experience complications related to bowel motility Outcome: Progressing Goal: Will not experience complications related to urinary retention Outcome: Progressing   Problem: Pain Managment: Goal: General experience of comfort will improve Outcome: Progressing   Problem: Safety: Goal: Ability to remain free from injury will improve Outcome: Progressing   Problem: Skin Integrity: Goal: Risk for impaired skin integrity will decrease Outcome: Progressing   Problem: Education: Goal: Ability to describe self-care measures that may prevent or decrease complications (Diabetes Survival Skills  Education) will improve Outcome: Progressing Goal: Individualized Educational Video(s) Outcome: Progressing   Problem: Coping: Goal: Ability to adjust to condition or change in health will improve Outcome: Progressing   Problem: Fluid Volume: Goal: Ability to maintain a balanced intake and output will improve Outcome: Progressing   Problem: Health Behavior/Discharge Planning: Goal: Ability to identify and utilize available resources and services will improve Outcome: Progressing Goal: Ability to manage health-related needs will improve Outcome: Progressing   Problem: Metabolic: Goal: Ability to maintain appropriate glucose levels will improve Outcome: Progressing   Problem: Nutritional: Goal: Maintenance of adequate nutrition will improve Outcome: Progressing Goal: Progress toward achieving an optimal weight will improve Outcome: Progressing   Problem: Skin Integrity: Goal: Risk for impaired skin integrity will decrease Outcome: Progressing   Problem: Tissue Perfusion: Goal: Adequacy of tissue perfusion will improve Outcome: Progressing

## 2022-09-16 DIAGNOSIS — K852 Alcohol induced acute pancreatitis without necrosis or infection: Secondary | ICD-10-CM | POA: Diagnosis not present

## 2022-09-16 DIAGNOSIS — A419 Sepsis, unspecified organism: Secondary | ICD-10-CM | POA: Diagnosis not present

## 2022-09-16 DIAGNOSIS — K659 Peritonitis, unspecified: Secondary | ICD-10-CM | POA: Diagnosis not present

## 2022-09-16 DIAGNOSIS — K859 Acute pancreatitis without necrosis or infection, unspecified: Secondary | ICD-10-CM | POA: Diagnosis not present

## 2022-09-16 LAB — CBC
HCT: 21.2 % — ABNORMAL LOW (ref 39.0–52.0)
Hemoglobin: 6.8 g/dL — CL (ref 13.0–17.0)
MCH: 27.8 pg (ref 26.0–34.0)
MCHC: 32.1 g/dL (ref 30.0–36.0)
MCV: 86.5 fL (ref 80.0–100.0)
Platelets: 771 10*3/uL — ABNORMAL HIGH (ref 150–400)
RBC: 2.45 MIL/uL — ABNORMAL LOW (ref 4.22–5.81)
RDW: 21.2 % — ABNORMAL HIGH (ref 11.5–15.5)
WBC: 15.5 10*3/uL — ABNORMAL HIGH (ref 4.0–10.5)
nRBC: 0 % (ref 0.0–0.2)

## 2022-09-16 LAB — CK: Total CK: 27 U/L — ABNORMAL LOW (ref 49–397)

## 2022-09-16 LAB — COMPREHENSIVE METABOLIC PANEL
ALT: 27 U/L (ref 0–44)
AST: 33 U/L (ref 15–41)
Albumin: 2.2 g/dL — ABNORMAL LOW (ref 3.5–5.0)
Alkaline Phosphatase: 147 U/L — ABNORMAL HIGH (ref 38–126)
Anion gap: 13 (ref 5–15)
BUN: 12 mg/dL (ref 6–20)
CO2: 25 mmol/L (ref 22–32)
Calcium: 8.5 mg/dL — ABNORMAL LOW (ref 8.9–10.3)
Chloride: 96 mmol/L — ABNORMAL LOW (ref 98–111)
Creatinine, Ser: 0.86 mg/dL (ref 0.61–1.24)
GFR, Estimated: >60 mL/min (ref >60)
Glucose, Bld: 132 mg/dL — ABNORMAL HIGH (ref 70–99)
Potassium: 4.3 mmol/L (ref 3.5–5.1)
Sodium: 134 mmol/L — ABNORMAL LOW (ref 135–145)
Total Bilirubin: 0.3 mg/dL (ref 0.3–1.2)
Total Protein: 8 g/dL (ref 6.5–8.1)

## 2022-09-16 LAB — PREPARE RBC (CROSSMATCH)

## 2022-09-16 LAB — TYPE AND SCREEN
ABO/RH(D): A POS
Unit division: 0

## 2022-09-16 LAB — BPAM RBC
Blood Product Expiration Date: 202408182359
ISSUE DATE / TIME: 202407221841

## 2022-09-16 LAB — LIPASE, BLOOD: Lipase: 49 U/L (ref 11–51)

## 2022-09-16 MED ORDER — SODIUM CHLORIDE 0.9% IV SOLUTION: Freq: Once | INTRAVENOUS | Status: DC

## 2022-09-16 NOTE — Progress Notes (Signed)
Nutrition Follow-up  DOCUMENTATION CODES:   Severe malnutrition in context of chronic illness  INTERVENTION:  Continue TF Vital 1.5 @55  ml/hr (1320 ml/day)  Prosource TF20 60 mL daily   Provides: 2060 kcal, 109 grams of protein, 1003 mL H2O daily  Continue SOFT diet  Encourage PO intake   Continue Banatrol TF TID   NUTRITION DIAGNOSIS:   Severe Malnutrition related to chronic illness (pancreatitis) as evidenced by severe fat depletion, severe muscle depletion, moderate fat depletion, moderate muscle depletion. -ongoing  GOAL:   Patient will meet greater than or equal to 90% of their needs -progressing with TF's mostly due to continued poor po intake   MONITOR:   PO intake, Supplement acceptance, Labs, Weight trends, TF tolerance, I & O's  REASON FOR ASSESSMENT:   Consult Enteral/tube feeding initiation and management  ASSESSMENT:   57 year old male with PMHx of EtOH abuse, chronic pancreatitis, chronic pancreatic insufficiency, HTN who is admitted with persistent/recurrent infected peritonitis now with infected pancreatic ascites likely from pancreatic duct disruption.  7/21 s/p paracentesis- 600 ml dark yellow fluid  - expected loculation   RD secure messaged patient's RN who reports that patient did not eat any of his breakfast this morning and that his TF's have been infusing at goal rate.   Per chart review, patient seems to be having 1-2 type 6/7 BM's per day. Will continue Banatrol TF  Labs: Na 134, Glu 132, alk phos 147 Meds: risquad, Ensure BID, PS x1, Banatrol TID, folvite, creon, MVI, sandostatin, protonix, NS, thiamine, vancocin  Wt: admit- 170#, current 156.8# PO: no meals documented since 7/17 I/O's: +420 ml since admission   Diet Order:   Diet Order             DIET SOFT Room service appropriate? Yes; Fluid consistency: Thin  Diet effective now                   EDUCATION NEEDS:   No education needs have been identified at this  time  Skin:  Skin Assessment: Reviewed RN Assessment  Last BM:  7/22 type 7  Height:   Ht Readings from Last 1 Encounters:  08/26/22 5\' 9"  (1.753 m)    Weight:   Wt Readings from Last 1 Encounters:  09/16/22 71.1 kg    Ideal Body Weight:  72.7 kg  BMI:  Body mass index is 23.16 kg/m.  Estimated Nutritional Needs:   Kcal:  2100-2300  Protein:  105-115 grams  Fluid:  2.1-2.3 L/day   Leodis Rains, RDN, LDN  Clinical Nutrition

## 2022-09-16 NOTE — TOC Progression Note (Addendum)
Transition of Care [facility]) - Progression Note    Patient Details  Name: Samuel Blevins MRN: [id number] Date of Birth: Jul 01, 1965  [facility]) CM/SW Contact  Graves-Bigelow, Lamar Laundry, RN Phone Number: 09/16/2022, 4:29 PM  Clinical Narrative:   Case Manager received notification from the MD that the patient will need transportation to [facility] for ERCP for 9:00 am tomorrow. [facility] is booked for tomorrow and [facility] states they are unable to transport the patient and stay until the procedure is completed. [facility] did suggest that maybe [facility] can admit the patient for services and keep the patient post procedure. MD is aware. No further needs from Case Manager.   Policy for [facility] hospital is that [facility] transportation has to deliver the patient to them and stay for the procedure- [facility] is saying they cannot tie up that time to stay with the patient.   Expected Discharge Plan: Homeless Shelter Barriers to Discharge: Continued Medical Work up  Expected Discharge Plan and Services In-house Referral: Clinical Social Work     Living arrangements for the past 2 months: Homeless, Homeless Shelter  Social Determinants of Health (SDOH) Interventions SDOH Screenings   Food Insecurity: Food Insecurity Present (08/26/2022)  Housing: Medium Risk (08/26/2022)  Transportation Needs: Unmet Transportation Needs (08/26/2022)  Utilities: Not At Risk (08/26/2022)  Depression (PHQ2-9): High Risk (02/14/2022)  Financial Resource Strain: Not on File (06/14/2021)   Received from [place], [place]  Physical Activity: Not on File (06/14/2021)   Received from [place], [place]  Social Connections: Not on File (06/14/2021)   Received from [place], [place]  Stress: Not on File (06/14/2021)   Received from [place], [place]  Tobacco Use: High Risk (08/26/2022)    Readmission Risk Interventions    09/04/2022   11:40 AM  Readmission Risk Prevention Plan  Transportation Screening Complete  Medication Review (RN Care Manager)  Complete  PCP or Specialist appointment within 3-5 days of discharge Complete  HRI or Home Care Consult Complete  SW Recovery Care/Counseling Consult Complete  Palliative Care Screening Not Applicable  Skilled Nursing Facility Not Applicable

## 2022-09-16 NOTE — Progress Notes (Signed)
Daily Progress Note  DOA: 08/26/2022 Hospital Day: 22 Chief Complaint: chronic pancreatitis, pancreatic duct disruption,    Assessment and Plan:    Brief Narrative:  Samuel Blevins is a 57 y.o. year old male with a history of  chronic pancreatitis, etoh abuse, admitted with persistent / recurrent infected peritonitis. Previously, SAAG was not compatible with portal hypertension which now makes since as this is pancreatic ascites with large amount of amylase in ascitic fluid.  However he could still have cirrhosis as imaging this admission coincidentally does show a"nodular" contour of fatty liver. More recent imaging ( MRI) shows only hepatomegaly and steatosis. He has had a prolonged admission  Prolonged hospitalization for chronic pancreatitis, PD disruption and VRE infected pancreatic ascites  -Continue Octreotide 200 mg SQ BID to decrease pancreatic secretions --Continue Cortrak (post-pyloric) feedings - On Daptomycin per ID.  WBC slowing improving  31 K >> 24.8K >>  18K>> 15k.  --Got another paracentesis yesterday (  600 ml ) yesterday but appears not fluid studies were ordered  - Continue Creon 12K units TID --Patient has been accepted to [facility] for ERCP / stent placement, awaiting bed  Acute on chronic anemia. Hgb 6.8, down form 7.4.  -A unit of blood has been ordered   AKI, resolved   C-diff --On day 10 of oral Vancomycin . Plan for extended treatment while on antibiotics for peritonitis per ID.  --Still having diarrhea, already 3 episodes today but could also be due in part to antibiotics and enteral feedings    Severe protein caloric malnutrition Continue cortrak post-pyloric feedings.    Subjective / New Events:  Abdominal pain better right now after pain meds. Has had 3 episodes of diarrhea so far today   Objective:   Recent Labs    09/14/22 0338 09/14/22 1413 09/16/22 0628  WBC 18.4*  --  15.5*  HGB 7.0* 7.4* 6.8*  HCT 21.6* 22.7* 21.2*  PLT 817*   --  771*   BMET Recent Labs    09/14/22 0338 09/16/22 0628  NA 133* 134*  K 3.8 4.3  CL 101 96*  CO2 22 25  GLUCOSE 116* 132*  BUN 13 12  CREATININE 1.17 0.86  CALCIUM 8.4* 8.5*   LFT Recent Labs    09/16/22 0628  PROT 8.0  ALBUMIN 2.2*  AST 33  ALT 27  ALKPHOS 147*  BILITOT 0.3   PT/INR No results for input(s): "LABPROT", "INR" in the last 72 hours.     Scheduled inpatient medications:   acidophilus  2 capsule Oral TID   enoxaparin (LOVENOX) injection  40 mg Subcutaneous QPM   feeding supplement  237 mL Oral BID BM   feeding supplement (PROSource TF20)  60 mL Per Tube Daily   fiber supplement (BANATROL TF)  60 mL Per Tube TID   folic acid  1 mg Oral Daily   gabapentin  300 mg Oral QHS   lidocaine  1 patch Transdermal Q24H   lipase/protease/amylase  12,000 Units Oral TID AC   multivitamin with minerals  1 tablet Oral Daily   octreotide  200 mcg Subcutaneous Q12H   pantoprazole  40 mg Oral BID AC   sodium chloride flush  10-40 mL Intracatheter Q12H   thiamine  100 mg Oral Daily   vancomycin  125 mg Oral Q6H   Continuous inpatient infusions:   DAPTOmycin (CUBICIN) 700 mg in sodium chloride 0.9 % IVPB 700 mg (09/16/22 1332)   feeding supplement (VITAL 1.5  CAL) 1,000 mL (09/16/22 1323)   PRN inpatient medications: acetaminophen, guaiFENesin, hydrALAZINE, hydrOXYzine, ipratropium-albuterol, metoprolol tartrate, naLOXone (NARCAN)  injection, ondansetron (ZOFRAN) IV, oxyCODONE, senna-docusate, sodium chloride flush, traZODone  Vital signs in last 24 hours: Temp:  [97.6 F (36.4 C)-98.7 F (37.1 C)] 98.7 F (37.1 C) (07/22 1225) Pulse Rate:  [100-105] 100 (07/22 1225) Resp:  [16-20] 18 (07/22 1225) BP: (124-146)/(80-93) 146/93 (07/22 1225) SpO2:  [98 %-100 %] 100 % (07/22 1225) Weight:  [71.1 kg] 71.1 kg (07/22 0519) Last BM Date : 09/16/22  Intake/Output Summary (Last 24 hours) at 09/16/2022 1446 Last data filed at 09/16/2022 1038 Gross per 24 hour   Intake 2215.43 ml  Output 1105 ml  Net 1110.43 ml    Intake/Output from previous day: 07/21 0701 - 07/22 0700 In: 2195.4 [NG/GT:2003.5; IV Piggyback:191.9] Out: 880 [Urine:850; Emesis/NG output:30] Intake/Output this shift: Total I/O In: 120 [NG/GT:120] Out: 225 [Urine:225]   Physical Exam:  General: Alert male in NAD Heart:  Regular rate and rhythm.  Pulmonary: Normal respiratory effort Abdomen: Soft, nondistended, mild generalized tenderness.. Normal bowel sounds. Neurologic: Alert and oriented Psych:Cooperative   Principal Problem:   Sepsis (HCC) Active Problems:   Alcohol dependence syndrome (HCC)   Pseudocyst of pancreas due to acute pancreatitis   Alcoholic pancreatitis   Acute on chronic pancreatitis (HCC)   Pancreatitis   Acute pancreatitis   Other ascites   Protein-calorie malnutrition, severe   Alcoholic liver disease (HCC)   Pancreatic ascites   Anasarca   SIRS (systemic inflammatory response syndrome) (HCC)   Infected ascites   Enteritis due to Clostridium difficile   Peritonitis (HCC)   Bacterial peritonitis (HCC)     LOS: 20 days   Willette Cluster ,NP 09/16/2022, 2:46 PM

## 2022-09-16 NOTE — Plan of Care (Signed)
  Problem: Fluid Volume: Goal: Hemodynamic stability will improve Outcome: Progressing   Problem: Clinical Measurements: Goal: Diagnostic test results will improve Outcome: Progressing Goal: Signs and symptoms of infection will decrease Outcome: Progressing   Problem: Education: Goal: Knowledge of General Education information will improve Description: Including pain rating scale, medication(s)/side effects and non-pharmacologic comfort measures Outcome: Progressing   Problem: Health Behavior/Discharge Planning: Goal: Ability to manage health-related needs will improve Outcome: Progressing   Problem: Clinical Measurements: Goal: Ability to maintain clinical measurements within normal limits will improve Outcome: Progressing Goal: Will remain free from infection Outcome: Progressing Goal: Diagnostic test results will improve Outcome: Progressing Goal: Respiratory complications will improve Outcome: Progressing Goal: Cardiovascular complication will be avoided Outcome: Progressing   Problem: Activity: Goal: Risk for activity intolerance will decrease Outcome: Progressing   Problem: Nutrition: Goal: Adequate nutrition will be maintained Outcome: Progressing   Problem: Coping: Goal: Level of anxiety will decrease Outcome: Progressing   Problem: Elimination: Goal: Will not experience complications related to bowel motility Outcome: Progressing Goal: Will not experience complications related to urinary retention Outcome: Progressing   Problem: Pain Managment: Goal: General experience of comfort will improve Outcome: Progressing   Problem: Safety: Goal: Ability to remain free from injury will improve Outcome: Progressing   Problem: Skin Integrity: Goal: Risk for impaired skin integrity will decrease Outcome: Progressing   Problem: Education: Goal: Ability to describe self-care measures that may prevent or decrease complications (Diabetes Survival Skills  Education) will improve Outcome: Progressing Goal: Individualized Educational Video(s) Outcome: Progressing   Problem: Coping: Goal: Ability to adjust to condition or change in health will improve Outcome: Progressing   Problem: Fluid Volume: Goal: Ability to maintain a balanced intake and output will improve Outcome: Progressing   Problem: Health Behavior/Discharge Planning: Goal: Ability to identify and utilize available resources and services will improve Outcome: Progressing Goal: Ability to manage health-related needs will improve Outcome: Progressing   Problem: Metabolic: Goal: Ability to maintain appropriate glucose levels will improve Outcome: Progressing   Problem: Nutritional: Goal: Maintenance of adequate nutrition will improve Outcome: Progressing Goal: Progress toward achieving an optimal weight will improve Outcome: Progressing   Problem: Skin Integrity: Goal: Risk for impaired skin integrity will decrease Outcome: Progressing   Problem: Tissue Perfusion: Goal: Adequacy of tissue perfusion will improve Outcome: Progressing

## 2022-09-16 NOTE — Progress Notes (Signed)
PROGRESS NOTE    Samuel Blevins  MVH:[id number] DOB: 1965/08/25 DOA: 08/26/2022 PCP: [name], MD   Brief Narrative:  57 year old with history of recurrent pancreatitis, chronic pancreatic insufficiency, HTN , recurrent alcohol use comes to the hospital with complaints of abdominal pain, nausea, vomiting and diarrhea.  Patient was also found to have acute recurrent alcoholic pancreatitis.  Initially patient had left AMA about 4-5 days prior to this admission.  Patient readmitted for acute recurrent alcoholic pancreatitis with worsening ascites, initial labs of ascites fluid indicate elevated amylase concerning for pancreatic duct disruption.    Hospital course complicated with presence of recurrent ascites, infected ascites fluid, ongoing abdominal pain and acute renal failure.  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 Being followed by GI, nephrology, and ID.   GI now recommending transfer to outside facility for ERCP to attempt to stent pancreatic duct disruption. [facility] accepted however no beds are currently available - hopeful he may be able to transition over for procedure only and transition back to our facility afterwards.  Assessment & Plan:   Principal Problem:   Sepsis (HCC) Active Problems:   Alcohol dependence syndrome (HCC)   Pseudocyst of pancreas due to acute pancreatitis   Alcoholic pancreatitis   Acute on chronic pancreatitis (HCC)   Pancreatitis   Acute pancreatitis   Other ascites   Protein-calorie malnutrition, severe   Alcoholic liver disease (HCC)   Pancreatic ascites   Anasarca   SIRS (systemic inflammatory response syndrome) (HCC)   Infected ascites   Enteritis due to Clostridium difficile   Peritonitis (HCC)   Bacterial peritonitis (HCC)  Decompensated liver cirrhosis with ascites Sepsis secondary to SBP versus infected pancreatic  ascites Hypoalbuminemia Hypovolemic hyponatremia -Need for ERCP/pancreatic duct stenting at outside facility given questionable pancreatic duct disruption with noted amylase and ascitic fluid - Spoke with Dorene Grebe at [facility] endoscopy 7/22 - plan to perform ERCP at their facility at 0900 on 09/16/22 for ERCP/stenting procedure and then transfer back to [facility] for ongoing medical management; NPO at midnight -Sepsis evidenced by tachycardia and leukocytosis - source: klebsiella ascites vs cdiff (both presumed POA). -Paracentesis 7/1 - 3L; 7/5 - 3L; 7/8 2L, 7/12 1.7 L. 7/21 600cc Echo: EF 60-65% with normal LVF and grade 1 diastolic dysfunction -Multiple Cortrak's "accidentally" removed by patient -most recent core track replacement 7/19 -Cultures(blood and paracentesis) initially negative however cultures on paracentesis from 7/12 shows vancomycin resistant enterococcus. Now on daptomycin per ID -Urine culture shows multiple species, likely contaminant -Paracentesis 7/21 appears somewhat loculated, with diminishing returns, this is to be expected per discussion with radiology as we repeat his paracenteses over time  Improving C. difficile colitis:  Increased diarrhea 09/06/2022 - stool positive for C. Difficile PO vancomycin will need to continue after completion of dapto per ID.   Anemia of chronic disease:  Hemoglobin downtrend ongoing, 6.8 today - patient has chart flagged for blood product refusal but he verbally agreed to blood and wants the flag removed. 1u PRBC to transfuse today pending type/screen  Recurrent alcohol induced pancreatitis. Nausea vomiting, resolved -Secondary to chronic alcohol use/abuse p.o. intake as tolerated, pain control avoiding IV narcotics, antiemetics, PPI   Severe protein caloric malnutrition High risk for refeeding syndrome -Complicated by chronic alcohol use -Cortrack placed 7/5 -dysfunctional 7/6 -replaced 09/02/2022, noted to be distal to the proximal duodenum.    - Multiple cortrack tubes accidentally versus intentionally dislodged and removed as above. -  Continue to follow electrolytes, mag/Phos. Repleted as appropriate -Continue tube feeds - diet ordered but limited PO intake to decrease pancreatic output   Alcohol abuse - Alcohol withdrawal protocol completed, currently outside the window for any further withdrawal symptoms.  No further need for benzos IV versus p.o.    Essential hypertension -Currently well-controlled off medications, as needed hydralazine for hypertensive events   GERD/duodenitis -Continue PPI   Acute kidney injury with hyperkalemia: Followed by nephrology.  Treated with Lokelma.  Urine output is adequate.  Slight improvement today.  Acute hypoxic respiratory failure, transient and resolved:  Acute episode overnight on the night of 09/05/2022 Chest x-ray reported atelectasis, likely secondary to enlarging ascites. Improved s/p paracentesis  Noncompliance Acute metabolic/toxic encephalopathy, resolved -Multiple episodes of abnormal behavior previously, multiple episodes of Cortrak removal, nursing staff noted patient eating cigarettes last week.  Appears to be alert and oriented more recently, presumed to be secondary to multiple comorbidities as above  DVT prophylaxis: enoxaparin (LOVENOX) injection 40 mg Start: 09/11/22 1800 Code Status:   Code Status: Full Code Family Communication: None present  Status is: Inpatient  Dispo: The patient is from: Home              Anticipated d/c is to: To be determined              Anticipated d/c date is: To be determined              Patient currently not medically stable for discharge  Consultants:  GI, ID, nephrology  Procedures:  Multiple cortrack placements, multiple paracentesis, EEG  Antimicrobials:  Daptomycin, p.o. vancomycin  Subjective: No acute issues or events overnight, patient denies nausea vomiting diarrhea constipation headache fevers chills or chest  pain  Objective: Vitals:   09/15/22 2045 09/16/22 0042 09/16/22 0519 09/16/22 0731  BP: 126/80 (!) 130/91 124/89 125/87  Pulse: (!) 105 (!) 103 (!) 102   Resp: 20 16  18   Temp: 98.5 F (36.9 C) 98.4 F (36.9 C) 98 F (36.7 C) 97.8 F (36.6 C)  TempSrc: Oral Oral Oral Oral  SpO2: 100% 98% 100%   Weight:   71.1 kg   Height:        Intake/Output Summary (Last 24 hours) at 09/16/2022 0747 Last data filed at 09/16/2022 0600 Gross per 24 hour  Intake 2195.43 ml  Output 880 ml  Net 1315.43 ml   Filed Weights   09/14/22 0630 09/15/22 0833 09/16/22 0519  Weight: 70 kg 69.5 kg 71.1 kg    Examination:  General: Awake alert, resting comfortably in bed oriented x 3 HEENT: Cortrak secured left nare Lungs: Coarse breath sounds bilaterally without overt wheezes rales or rhonchi. Heart: Regular rate rhythm no murmurs rubs or gallops. Abdomen: Diffusely tender, minimally distended but soft without rebound or guarding   LOS: 20 days   Time spent:  Azucena Fallen, DO [facility]  If 7PM-7AM, please contact night-coverage [email or web address]  09/16/2022, 7:47 AM

## 2022-09-16 NOTE — Progress Notes (Signed)
Mobility Specialist Progress Note:    09/16/22 1523  Mobility  Activity Ambulated with assistance in hallway  Level of Assistance Contact guard assist, steadying assist  Assistive Device Four wheel walker  Distance Ambulated (ft) 400 ft  Activity Response Tolerated well  Mobility Referral Yes  $Mobility charge 1 Mobility  Mobility Specialist Start Time (ACUTE ONLY) 1430  Mobility Specialist Stop Time (ACUTE ONLY) 1445  Mobility Specialist Time Calculation (min) (ACUTE ONLY) 15 min   Pt received in bed, agreeable to ambulate. Pt required no physical assistance during ambulation. Prior to ambulating in halls pt requested to use the [facility], void complete. No c/o throughout session. Pt assisted back to bed, w/ call bell and personal belongings in reach. All needs met.  Thompson Grayer Mobility Specialist  Please contact vis Secure Chat or  Rehab Office [phone]

## 2022-09-16 NOTE — Progress Notes (Signed)
Pharmacy Antibiotic Note  Samuel Blevins is a 57 y.o. male admitted on 08/26/2022 with decompensated liver cirrhosis, ascites and VRE peritonitis. Pharmacy has been consulted for daptomycin dosing. Also with C diff on oral vancomycin (ends 7/26)  -SCr= 0.86 -CK= 27  Plan: -Continue Daptomycin 700 mg IV q24h (10 mg/kg) -Monitor renal function, clinical progress, cultures/sensitivities -F/U LOT per ID, CK on Monday -Vancomycin po duration will need to be extended based on daptomycin plans  Height: 5\' 9"  (175.3 cm) Weight: 71.1 kg (156 lb 12.8 oz) IBW/kg (Calculated) : 70.7  Temp (24hrs), Avg:98.1 F (36.7 C), Min:97.6 F (36.4 C), Max:98.5 F (36.9 C)  Recent Labs  Lab 09/10/22 0017 09/11/22 0014 09/12/22 0117 09/13/22 0708 09/14/22 0338 09/16/22 0628  WBC 18.1*  --  20.4* 17.1* 18.4* 15.5*  CREATININE 3.07* 2.53* 1.86* 1.49* 1.17 0.86    Estimated Creatinine Clearance: 93.6 mL/min (by C-G formula based on SCr of 0.86 mg/dL).    No Known Allergies  Antimicrobials this admission: Cefepime 7/1 Zosyn 7/12 >> 7/14 Vanc PO (C diff +) 7/12 >> (7/26, potentially longer depending of DOT of dapto) Dapto 7/14 >>    7/14 CK: 69  Microbiology results: 7/1- Bcx- negF 7/1- peritoneal fluid cx- negF 7/12 C diff + 7/12 Peritoneal washing - VRE   Thank you for involving pharmacy in this patient's care. Harland German, PharmD Clinical Pharmacist **Pharmacist phone directory can now be found on [email or web address] ([email or web address]).  Listed under [facility].

## 2022-09-17 DIAGNOSIS — K860 Alcohol-induced chronic pancreatitis: Secondary | ICD-10-CM | POA: Diagnosis not present

## 2022-09-17 DIAGNOSIS — K8689 Other specified diseases of pancreas: Secondary | ICD-10-CM | POA: Diagnosis not present

## 2022-09-17 DIAGNOSIS — K659 Peritonitis, unspecified: Secondary | ICD-10-CM | POA: Diagnosis not present

## 2022-09-17 DIAGNOSIS — A0472 Enterocolitis due to Clostridium difficile, not specified as recurrent: Secondary | ICD-10-CM | POA: Diagnosis not present

## 2022-09-17 DIAGNOSIS — K859 Acute pancreatitis without necrosis or infection, unspecified: Secondary | ICD-10-CM | POA: Diagnosis not present

## 2022-09-17 DIAGNOSIS — K852 Alcohol induced acute pancreatitis without necrosis or infection: Secondary | ICD-10-CM | POA: Diagnosis not present

## 2022-09-17 DIAGNOSIS — A419 Sepsis, unspecified organism: Secondary | ICD-10-CM | POA: Diagnosis not present

## 2022-09-17 LAB — CBC
HCT: 23.4 % — ABNORMAL LOW (ref 39.0–52.0)
HCT: 24.8 % — ABNORMAL LOW (ref 39.0–52.0)
Hemoglobin: 7.6 g/dL — ABNORMAL LOW (ref 13.0–17.0)
Hemoglobin: 8.2 g/dL — ABNORMAL LOW (ref 13.0–17.0)
MCH: 27.9 pg (ref 26.0–34.0)
MCH: 27.8 pg (ref 26.0–34.0)
MCHC: 32.5 g/dL (ref 30.0–36.0)
MCHC: 33.1 g/dL (ref 30.0–36.0)
MCV: 86 fL (ref 80.0–100.0)
MCV: 84.1 fL (ref 80.0–100.0)
Platelets: 879 10*3/uL — ABNORMAL HIGH (ref 150–400)
Platelets: 974 10*3/uL (ref 150–400)
RBC: 2.72 MIL/uL — ABNORMAL LOW (ref 4.22–5.81)
RBC: 2.95 MIL/uL — ABNORMAL LOW (ref 4.22–5.81)
RDW: 20.2 % — ABNORMAL HIGH (ref 11.5–15.5)
RDW: 20.5 % — ABNORMAL HIGH (ref 11.5–15.5)
WBC: 14.6 10*3/uL — ABNORMAL HIGH (ref 4.0–10.5)
WBC: 22.8 10*3/uL — ABNORMAL HIGH (ref 4.0–10.5)
nRBC: 0 % (ref 0.0–0.2)
nRBC: 0 % (ref 0.0–0.2)

## 2022-09-17 LAB — COMPREHENSIVE METABOLIC PANEL
ALT: 40 U/L (ref 0–44)
AST: 61 U/L — ABNORMAL HIGH (ref 15–41)
Albumin: 2.3 g/dL — ABNORMAL LOW (ref 3.5–5.0)
Alkaline Phosphatase: 156 U/L — ABNORMAL HIGH (ref 38–126)
Anion gap: 9 (ref 5–15)
BUN: 10 mg/dL (ref 6–20)
CO2: 25 mmol/L (ref 22–32)
Calcium: 8.6 mg/dL — ABNORMAL LOW (ref 8.9–10.3)
Chloride: 101 mmol/L (ref 98–111)
Creatinine, Ser: 0.77 mg/dL (ref 0.61–1.24)
GFR, Estimated: >60 mL/min (ref >60)
Glucose, Bld: 107 mg/dL — ABNORMAL HIGH (ref 70–99)
Potassium: 4.1 mmol/L (ref 3.5–5.1)
Sodium: 135 mmol/L (ref 135–145)
Total Bilirubin: 0.6 mg/dL (ref 0.3–1.2)
Total Protein: 8.2 g/dL — ABNORMAL HIGH (ref 6.5–8.1)

## 2022-09-17 LAB — PROTIME-INR
INR: 1.2 (ref 0.8–1.2)
Prothrombin Time: 15.2 seconds (ref 11.4–15.2)

## 2022-09-17 LAB — BPAM RBC: Unit Type and Rh: 6200

## 2022-09-17 LAB — TYPE AND SCREEN: Antibody Screen: NEGATIVE

## 2022-09-17 LAB — LIPASE, BLOOD: Lipase: 62 U/L — ABNORMAL HIGH (ref 11–51)

## 2022-09-17 MED ORDER — NICOTINE 21 MG/24HR TD PT24
21.0000 mg | MEDICATED_PATCH | Freq: Every day | TRANSDERMAL | Status: DC
  Administered 2022-09-17 – 2022-09-18 (×2): 21 mg via TRANSDERMAL
  Filled 2022-09-17 (×2): qty 1

## 2022-09-17 MED ORDER — OXYCODONE HCL 5 MG PO TABS
5.0000 mg | ORAL_TABLET | Freq: Once | ORAL | Status: AC
  Administered 2022-09-17: 5 mg via ORAL
  Filled 2022-09-17: qty 1

## 2022-09-17 NOTE — Progress Notes (Signed)
At 19:35 notified provider Imogene Burn) of patient's platelets [phone]. Provider acknowledged results at 1940.

## 2022-09-17 NOTE — Progress Notes (Addendum)
PROGRESS NOTE    Samuel Blevins  OZD:[id number] DOB: 06-12-1965 DOA: 08/26/2022 PCP: [name], MD   Brief Narrative:  57 year old with history of recurrent pancreatitis, chronic pancreatic insufficiency, HTN , recurrent alcohol use comes to the hospital with complaints of abdominal pain, nausea, vomiting and diarrhea.  Patient was also found to have acute recurrent alcoholic pancreatitis.  Initially patient had left AMA about 4-5 days prior to this admission.  Patient readmitted for acute recurrent alcoholic pancreatitis with worsening ascites, initial labs of ascites fluid indicate elevated amylase concerning for pancreatic duct disruption.    Hospital course complicated with presence of recurrent ascites, infected ascites fluid, ongoing abdominal pain and acute renal failure.  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 Being followed by GI, nephrology, and ID.   GI now recommending transfer to outside facility for ERCP to attempt to stent pancreatic duct disruption. [facility] accepted however no beds are currently available - hopeful he may be able to transition over for procedure only and transition back to our facility afterwards.  Patient transferred to and from [facility] today for ERCP with pancreatic stenting.  Unable to get into minor papilla ultimately requiring EUS guided pancreaticogastrostomy -with a plastic stent from the stomach to the pancreatic duct.  Hopeful this will allow drainage in the short-term, plan to repeat ERCP in 2 weeks for larger diameter stent/evaluate status.  Assessment & Plan:   Principal Problem:   Sepsis (HCC) Active Problems:   Alcohol dependence syndrome (HCC)   Pseudocyst of pancreas due to acute pancreatitis   Alcoholic pancreatitis   Acute on chronic pancreatitis (HCC)   Pancreatitis   Acute pancreatitis   Other ascites   Protein-calorie malnutrition,  severe   Alcoholic liver disease (HCC)   Pancreatic ascites   Anasarca   SIRS (systemic inflammatory response syndrome) (HCC)   Infected ascites   Enteritis due to Clostridium difficile   Peritonitis (HCC)   Bacterial peritonitis (HCC)  Decompensated liver cirrhosis with ascites Sepsis secondary to VRE positive pancreatic ascites -Pancreaticogastrostomy stent placed 09/17/2022 at [facility] with Dr. Corliss Parish.  Plan to repeat ERCP at their facility in 2 weeks to evaluate stent and likely replaced with larger diameter stent. -Sepsis evidenced by tachycardia and leukocytosis - source: klebsiella ascites vs cdiff (both presumed POA). -Paracentesis 7/1 - 3L; 7/5 - 3L; 7/8 2L, 7/12 1.7 L. 7/21 600cc Echo: EF 60-65% with normal LVF and grade 1 diastolic dysfunction -Multiple Cortrak's removed by patient -dislodged again intentionally 7/23 at [facility]; will keep NG out; advance diet as tolerated -Cultures(blood and paracentesis) initially negative however cultures on paracentesis from 7/12 shows vancomycin resistant enterococcus - on daptomycin per ID -Urine culture shows multiple species, likely contaminant -Paracentesis 7/21 appears somewhat loculated, with diminishing returns, this is to be expected per discussion with radiology as we repeat his paracenteses over time  Improving C. difficile colitis:  Increased diarrhea 09/06/2022 - stool positive for C. Difficile PO vancomycin will need to continue after completion of dapto per ID.   Anemia of chronic disease:  Hemoglobin downtrend ongoing, 6.8 today - patient has chart flagged for blood product refusal but he verbally agreed to blood and wants the flag removed. 1u PRBC to transfuse today pending type/screen  Hypoalbuminemia Hypovolemic hyponatremia -Secondary to above, improving with tube feeds -Advance diet as tolerated, cortrack pulled again in outside facility  Recurrent alcohol induced pancreatitis. Nausea vomiting, resolved -Secondary to  chronic alcohol  use/abuse p.o. intake as tolerated, pain control avoiding IV narcotics, antiemetics, PPI   Severe protein caloric malnutrition High risk for refeeding syndrome -Complicated by chronic alcohol use -Cortrack placed 7/5 -dysfunctional 7/6 -replaced 09/02/2022, noted to be distal to the proximal duodenum.   - Multiple cortrack tubes accidentally versus intentionally dislodged and removed as above. -Continue to follow electrolytes, mag/Phos. Repleted as appropriate -Continue tube feeds - diet ordered but limited PO intake to decrease pancreatic output   Alcohol abuse - Alcohol withdrawal protocol completed, currently outside the window for any further withdrawal symptoms.  No further need for benzos IV versus p.o.    Essential hypertension -Currently well-controlled off medications, as needed hydralazine for hypertensive events   GERD/duodenitis -Continue PPI   Acute kidney injury with hyperkalemia: Followed by nephrology.  Treated with Lokelma.  Urine output is adequate.  Slight improvement today.  Acute hypoxic respiratory failure, transient and resolved:  Acute episode overnight on the night of 09/05/2022 Chest x-ray reported atelectasis, likely secondary to enlarging ascites. Improved s/p paracentesis  Noncompliance Acute metabolic/toxic encephalopathy, resolved -Multiple episodes of abnormal behavior previously, multiple episodes of Cortrak removal, nursing staff noted patient eating cigarettes last week.  Appears to be alert and oriented more recently, presumed to be secondary to multiple comorbidities as above  DVT prophylaxis: enoxaparin (LOVENOX) injection 40 mg Start: 09/11/22 1800 Code Status:   Code Status: Full Code Family Communication: None present  Status is: Inpatient  Dispo: The patient is from: Home              Anticipated d/c is to: To be determined              Anticipated d/c date is: To be determined              Patient currently not medically  stable for discharge  Consultants:  GI, ID, nephrology  Procedures:  Multiple cortrack placements, multiple paracentesis, EEG  Antimicrobials:  Daptomycin, p.o. vancomycin  Subjective: No acute issues or events overnight, tolerated procedure quite well at outside facility denies nausea vomiting diarrhea constipation any fevers chills or chest pain.  Abdominal pain moderate, not unexpected given recent instrumentation.  Objective: Vitals:   09/17/22 0400 09/17/22 0421 09/17/22 0600 09/17/22 0735  BP:  (!) 142/93    Pulse:  (!) 102    Resp: 18 16 17 18   Temp:  98.5 F (36.9 C)  98.6 F (37 C)  TempSrc:  Oral  Oral  SpO2: 100% 100%    Weight:  68.3 kg    Height:        Intake/Output Summary (Last 24 hours) at 09/17/2022 0746 Last data filed at 09/17/2022 0600 Gross per 24 hour  Intake 1260 ml  Output 225 ml  Net 1035 ml   Filed Weights   09/15/22 0833 09/16/22 0519 09/17/22 0421  Weight: 69.5 kg 71.1 kg 68.3 kg    Examination:  General: Awake alert, resting comfortably in bed oriented x 3 Lungs: Coarse breath sounds bilaterally without overt wheezes rales or rhonchi. Heart: Regular rate rhythm no murmurs rubs or gallops. Abdomen: Diffusely tender, minimally distended but soft without rebound or guarding   LOS: 21 days   Time spent:  Azucena Fallen, DO [facility]  If 7PM-7AM, please contact night-coverage [email or web address]  09/17/2022, 7:46 AM

## 2022-09-17 NOTE — Progress Notes (Signed)
Daily Progress Note  DOA: 08/26/2022 Hospital Day: 54 Chief Complaint: chronic pancreatitis, pancreatic duct disruption   Assessment and Plan:    Brief Narrative:  Samuel Blevins is a 57 y.o. year old male with a history of  chronic pancreatitis, etoh abuse, admitted with persistent / recurrent infected peritonitis. Previously, SAAG was not compatible with portal hypertension which now makes since as this is pancreatic ascites with large amount of amylase in ascitic fluid.  However he could still have cirrhosis as imaging this admission coincidentally does show a"nodular" contour of fatty liver. More recent imaging ( MRI) shows only hepatomegaly and steatosis. He has had a prolonged admission   Prolonged hospitalization for chronic pancreatitis, PD disruption and VRE infected pancreatic ascites  -Continue Octreotide 200 mg SQ BID to decrease pancreatic secretions --Continue Cortrak (post-pyloric) feedings - On Daptomycin per ID.  WBC slowing improving  31 K >> 24.8K >>  18K>> 15 K >> 14.6K . Will need to check with ID about length of antibiotics.  --Got another paracentesis on 7/21 (  600 ml )  but appears no fluid studies were ordered  - Continue Creon 12K units TID - Went to [facility] today for ERCP / stenting but unable to access PD so had EUS guided pancreaticogastrostomy to treat PD disruption. A 15 cm long X 5 Fr  stent was placed into the pancreatic duct through the pancreaticogastrostomy with the distal end (straight) in the duodenum and proximal pigtail end in the stomach.  - He needs to return to [facility] in 10 days for upsizing of stent. Not sure patient will have transportation to make it there.   Acute on chronic anemia. Hgb 6.8, down form 7.4.  -Hgb improved from 6.8 to 7.6 post one unit of blood yesterday    C-diff --On day 11 of oral Vancomycin . Plan for extended treatment while on antibiotics for peritonitis per ID.    Severe protein caloric malnutrition Patient pulled  out his Cortrak. Will see how he does on PO   Subjective / New Events:  Complains of severe left sided abdominal pain since returning from [facility] a short while ago.    Objective:                                                                                 Recent Labs    09/16/22 0628 09/17/22 0617  WBC 15.5* 14.6*  HGB 6.8* 7.6*  HCT 21.2* 23.4*  PLT 771* 879*   BMET Recent Labs    09/16/22 0628 09/17/22 0617  NA 134* 135  K 4.3 4.1  CL 96* 101  CO2 25 25  GLUCOSE 132* 107*  BUN 12 10  CREATININE 0.86 0.77  CALCIUM 8.5* 8.6*   LFT Recent Labs    09/17/22 0617  PROT 8.2*  ALBUMIN 2.3*  AST 61*  ALT 40  ALKPHOS 156*  BILITOT 0.6   PT/INR Recent Labs    09/17/22 0617  LABPROT 15.2  INR 1.2     Imaging:  US Paracentesis INDICATION: Patient with pancreatitis, recurrent ascites. Request for therapeutic paracentesis.  EXAM: ULTRASOUND GUIDED THERAPEUTIC PARACENTESIS  MEDICATIONS: 10 mL 1% lidocaine  COMPLICATIONS: None immediate.  PROCEDURE: Informed written consent was obtained from the patient after a discussion of the risks, benefits and alternatives to treatment. A timeout was performed prior to the initiation of the procedure.  Initial ultrasound scanning demonstrates a moderate amount of loculated-appearing ascites within the left lateral abdomen. The left lateral abdomen was prepped and draped in the usual sterile fashion. 1% lidocaine was used for local anesthesia.  Following this, a 19 gauge, 7-cm, Yueh catheter was introduced. An ultrasound image was saved for documentation purposes. The paracentesis was performed. The catheter was removed and a dressing was applied. The patient tolerated the procedure well without immediate post procedural complication.  FINDINGS: A total of approximately 600 mL of dark, yellow fluid was removed.  IMPRESSION: Successful ultrasound-guided paracentesis yielding 600 mL of peritoneal  fluid.  Performed by: Loyce Dys PA-C  Electronically Signed   By: Malachy Moan M.D.   On: 09/15/2022 15:06     Scheduled inpatient medications:   sodium chloride   Intravenous Once   acidophilus  2 capsule Oral TID   enoxaparin (LOVENOX) injection  40 mg Subcutaneous QPM   feeding supplement  237 mL Oral BID BM   feeding supplement (PROSource TF20)  60 mL Per Tube Daily   fiber supplement (BANATROL TF)  60 mL Per Tube TID   folic acid  1 mg Oral Daily   gabapentin  300 mg Oral QHS   lidocaine  1 patch Transdermal Q24H   lipase/protease/amylase  12,000 Units Oral TID AC   multivitamin with minerals  1 tablet Oral Daily   nicotine  21 mg Transdermal Daily   octreotide  200 mcg Subcutaneous Q12H   pantoprazole  40 mg Oral BID AC   sodium chloride flush  10-40 mL Intracatheter Q12H   thiamine  100 mg Oral Daily   vancomycin  125 mg Oral Q6H   Continuous inpatient infusions:   DAPTOmycin (CUBICIN) 700 mg in sodium chloride 0.9 % IVPB 700 mg (09/17/22 1639)   feeding supplement (VITAL 1.5 CAL) Stopped (09/17/22 0000)   PRN inpatient medications: acetaminophen, guaiFENesin, hydrALAZINE, hydrOXYzine, ipratropium-albuterol, metoprolol tartrate, naLOXone (NARCAN)  injection, ondansetron (ZOFRAN) IV, oxyCODONE, senna-docusate, sodium chloride flush, traZODone  Vital signs in last 24 hours: Temp:  [98.2 F (36.8 C)-99 F (37.2 C)] 98.6 F (37 C) (07/23 0735) Pulse Rate:  [102-112] 102 (07/23 0421) Resp:  [16-18] 18 (07/23 0735) BP: (128-154)/(82-98) 154/92 (07/23 0736) SpO2:  [97 %-100 %] 100 % (07/23 0421) Weight:  [68.3 kg] 68.3 kg (07/23 0421) Last BM Date : 09/17/22  Intake/Output Summary (Last 24 hours) at 09/17/2022 1733 Last data filed at 09/17/2022 0600 Gross per 24 hour  Intake 1140 ml  Output --  Net 1140 ml    Intake/Output from previous day: 07/22 0701 - 07/23 0700 In: 1260 [P.O.:120; Blood:630; NG/GT:510] Out: 225 [Urine:225] Intake/Output this  shift: No intake/output data recorded.   Physical Exam:  General: Alert male in NAD having significant LUQ pain right now Heart:  Regular rate.  Pulmonary: Normal respiratory effort Abdomen: Soft, mildly distended, moderate LUQ tenderness. A few bowel sounds. Neurologic: Alert and oriented Psych:  Cooperative. Insight appears normal.    Principal Problem:   Sepsis (HCC) Active Problems:   Alcohol dependence syndrome (HCC)   Pseudocyst of pancreas due to acute pancreatitis   Alcoholic pancreatitis   Acute on chronic pancreatitis (HCC)   Pancreatitis   Acute pancreatitis   Other ascites   Protein-calorie malnutrition, severe  Alcoholic liver disease (HCC)   Pancreatic ascites   Anasarca   SIRS (systemic inflammatory response syndrome) (HCC)   Infected ascites   Enteritis due to Clostridium difficile   Peritonitis (HCC)   Bacterial peritonitis (HCC)     LOS: 21 days   Willette Cluster ,NP 09/17/2022, 5:33 PM

## 2022-09-17 NOTE — Progress Notes (Signed)
Pt has successfully left the floor and is now heading to [facility] for ERCP.

## 2022-09-17 NOTE — Plan of Care (Signed)
  Problem: Fluid Volume: Goal: Hemodynamic stability will improve Outcome: Progressing   Problem: Clinical Measurements: Goal: Diagnostic test results will improve Outcome: Progressing Goal: Signs and symptoms of infection will decrease Outcome: Progressing   Problem: Education: Goal: Knowledge of General Education information will improve Description: Including pain rating scale, medication(s)/side effects and non-pharmacologic comfort measures Outcome: Progressing   Problem: Health Behavior/Discharge Planning: Goal: Ability to manage health-related needs will improve Outcome: Progressing   Problem: Clinical Measurements: Goal: Ability to maintain clinical measurements within normal limits will improve Outcome: Progressing Goal: Will remain free from infection Outcome: Progressing Goal: Diagnostic test results will improve Outcome: Progressing Goal: Respiratory complications will improve Outcome: Progressing Goal: Cardiovascular complication will be avoided Outcome: Progressing   Problem: Activity: Goal: Risk for activity intolerance will decrease Outcome: Progressing   Problem: Nutrition: Goal: Adequate nutrition will be maintained Outcome: Progressing   Problem: Coping: Goal: Level of anxiety will decrease Outcome: Progressing   Problem: Elimination: Goal: Will not experience complications related to bowel motility Outcome: Progressing Goal: Will not experience complications related to urinary retention Outcome: Progressing   Problem: Pain Managment: Goal: General experience of comfort will improve Outcome: Progressing   Problem: Safety: Goal: Ability to remain free from injury will improve Outcome: Progressing   Problem: Skin Integrity: Goal: Risk for impaired skin integrity will decrease Outcome: Progressing   Problem: Education: Goal: Ability to describe self-care measures that may prevent or decrease complications (Diabetes Survival Skills  Education) will improve Outcome: Progressing Goal: Individualized Educational Video(s) Outcome: Progressing   Problem: Coping: Goal: Ability to adjust to condition or change in health will improve Outcome: Progressing   Problem: Fluid Volume: Goal: Ability to maintain a balanced intake and output will improve Outcome: Progressing   Problem: Health Behavior/Discharge Planning: Goal: Ability to identify and utilize available resources and services will improve Outcome: Progressing Goal: Ability to manage health-related needs will improve Outcome: Progressing   Problem: Metabolic: Goal: Ability to maintain appropriate glucose levels will improve Outcome: Progressing   Problem: Nutritional: Goal: Maintenance of adequate nutrition will improve Outcome: Progressing Goal: Progress toward achieving an optimal weight will improve Outcome: Progressing   Problem: Skin Integrity: Goal: Risk for impaired skin integrity will decrease Outcome: Progressing   Problem: Tissue Perfusion: Goal: Adequacy of tissue perfusion will improve Outcome: Progressing

## 2022-09-18 ENCOUNTER — Inpatient Hospital Stay (HOSPITAL_COMMUNITY)
Admission: EM | Admit: 2022-09-18 | Discharge: 2022-10-02 | Disposition: A | Discharge status: Home / Self Care | Payer: MEDICAID | Source: Home / Self Care | Attending: Family Medicine | Admitting: Family Medicine

## 2022-09-18 ENCOUNTER — Emergency Department (HOSPITAL_COMMUNITY): Payer: MEDICAID

## 2022-09-18 ENCOUNTER — Inpatient Hospital Stay (HOSPITAL_COMMUNITY): Payer: MEDICAID

## 2022-09-18 ENCOUNTER — Other Ambulatory Visit: Payer: Self-pay

## 2022-09-18 ENCOUNTER — Encounter (HOSPITAL_COMMUNITY): Payer: Self-pay | Admitting: *Deleted

## 2022-09-18 DIAGNOSIS — Z5901 Sheltered homelessness: Secondary | ICD-10-CM

## 2022-09-18 DIAGNOSIS — E8809 Other disorders of plasma-protein metabolism, not elsewhere classified: Secondary | ICD-10-CM | POA: Diagnosis present

## 2022-09-18 DIAGNOSIS — F17210 Nicotine dependence, cigarettes, uncomplicated: Secondary | ICD-10-CM | POA: Diagnosis present

## 2022-09-18 DIAGNOSIS — E43 Unspecified severe protein-calorie malnutrition: Secondary | ICD-10-CM

## 2022-09-18 DIAGNOSIS — Q453 Other congenital malformations of pancreas and pancreatic duct: Secondary | ICD-10-CM

## 2022-09-18 DIAGNOSIS — Z91199 Patient's noncompliance with other medical treatment and regimen due to unspecified reason: Secondary | ICD-10-CM

## 2022-09-18 DIAGNOSIS — Z6822 Body mass index (BMI) 22.0-22.9, adult: Secondary | ICD-10-CM

## 2022-09-18 DIAGNOSIS — F1020 Alcohol dependence, uncomplicated: Secondary | ICD-10-CM

## 2022-09-18 DIAGNOSIS — Z5982 Transportation insecurity: Secondary | ICD-10-CM

## 2022-09-18 DIAGNOSIS — B964 Proteus (mirabilis) (morganii) as the cause of diseases classified elsewhere: Secondary | ICD-10-CM | POA: Diagnosis present

## 2022-09-18 DIAGNOSIS — K7460 Unspecified cirrhosis of liver: Secondary | ICD-10-CM | POA: Diagnosis present

## 2022-09-18 DIAGNOSIS — Z1152 Encounter for screening for COVID-19: Secondary | ICD-10-CM

## 2022-09-18 DIAGNOSIS — E875 Hyperkalemia: Secondary | ICD-10-CM | POA: Diagnosis present

## 2022-09-18 DIAGNOSIS — K852 Alcohol induced acute pancreatitis without necrosis or infection: Secondary | ICD-10-CM | POA: Diagnosis present

## 2022-09-18 DIAGNOSIS — K859 Acute pancreatitis without necrosis or infection, unspecified: Secondary | ICD-10-CM | POA: Diagnosis present

## 2022-09-18 DIAGNOSIS — E861 Hypovolemia: Secondary | ICD-10-CM | POA: Diagnosis present

## 2022-09-18 DIAGNOSIS — K651 Peritoneal abscess: Secondary | ICD-10-CM | POA: Diagnosis present

## 2022-09-18 DIAGNOSIS — Z79899 Other long term (current) drug therapy: Secondary | ICD-10-CM

## 2022-09-18 DIAGNOSIS — D649 Anemia, unspecified: Secondary | ICD-10-CM | POA: Diagnosis present

## 2022-09-18 DIAGNOSIS — A4181 Sepsis due to Enterococcus: Secondary | ICD-10-CM | POA: Diagnosis present

## 2022-09-18 DIAGNOSIS — K760 Fatty (change of) liver, not elsewhere classified: Secondary | ICD-10-CM | POA: Diagnosis present

## 2022-09-18 DIAGNOSIS — K652 Spontaneous bacterial peritonitis: Secondary | ICD-10-CM | POA: Diagnosis present

## 2022-09-18 DIAGNOSIS — R188 Other ascites: Secondary | ICD-10-CM | POA: Diagnosis present

## 2022-09-18 DIAGNOSIS — E871 Hypo-osmolality and hyponatremia: Secondary | ICD-10-CM | POA: Diagnosis present

## 2022-09-18 DIAGNOSIS — K863 Pseudocyst of pancreas: Secondary | ICD-10-CM | POA: Diagnosis present

## 2022-09-18 DIAGNOSIS — I10 Essential (primary) hypertension: Secondary | ICD-10-CM | POA: Diagnosis present

## 2022-09-18 DIAGNOSIS — G894 Chronic pain syndrome: Secondary | ICD-10-CM | POA: Diagnosis present

## 2022-09-18 DIAGNOSIS — K860 Alcohol-induced chronic pancreatitis: Secondary | ICD-10-CM | POA: Diagnosis present

## 2022-09-18 DIAGNOSIS — G9341 Metabolic encephalopathy: Secondary | ICD-10-CM | POA: Diagnosis present

## 2022-09-18 DIAGNOSIS — B999 Unspecified infectious disease: Secondary | ICD-10-CM

## 2022-09-18 DIAGNOSIS — D75838 Other thrombocytosis: Secondary | ICD-10-CM | POA: Diagnosis present

## 2022-09-18 DIAGNOSIS — J9601 Acute respiratory failure with hypoxia: Secondary | ICD-10-CM | POA: Diagnosis present

## 2022-09-18 DIAGNOSIS — R601 Generalized edema: Secondary | ICD-10-CM

## 2022-09-18 DIAGNOSIS — N179 Acute kidney failure, unspecified: Secondary | ICD-10-CM | POA: Diagnosis present

## 2022-09-18 DIAGNOSIS — Z5941 Food insecurity: Secondary | ICD-10-CM

## 2022-09-18 DIAGNOSIS — Z1621 Resistance to vancomycin: Secondary | ICD-10-CM | POA: Diagnosis present

## 2022-09-18 DIAGNOSIS — K219 Gastro-esophageal reflux disease without esophagitis: Secondary | ICD-10-CM | POA: Diagnosis present

## 2022-09-18 DIAGNOSIS — D638 Anemia in other chronic diseases classified elsewhere: Secondary | ICD-10-CM | POA: Diagnosis present

## 2022-09-18 DIAGNOSIS — K709 Alcoholic liver disease, unspecified: Secondary | ICD-10-CM

## 2022-09-18 DIAGNOSIS — A0472 Enterocolitis due to Clostridium difficile, not specified as recurrent: Secondary | ICD-10-CM | POA: Diagnosis present

## 2022-09-18 DIAGNOSIS — K659 Peritonitis, unspecified: Secondary | ICD-10-CM

## 2022-09-18 DIAGNOSIS — K2980 Duodenitis without bleeding: Secondary | ICD-10-CM | POA: Diagnosis present

## 2022-09-18 DIAGNOSIS — K8689 Other specified diseases of pancreas: Secondary | ICD-10-CM | POA: Diagnosis present

## 2022-09-18 DIAGNOSIS — F1010 Alcohol abuse, uncomplicated: Secondary | ICD-10-CM | POA: Diagnosis present

## 2022-09-18 DIAGNOSIS — Z8719 Personal history of other diseases of the digestive system: Secondary | ICD-10-CM

## 2022-09-18 DIAGNOSIS — E876 Hypokalemia: Secondary | ICD-10-CM | POA: Diagnosis not present

## 2022-09-18 DIAGNOSIS — J9811 Atelectasis: Secondary | ICD-10-CM | POA: Diagnosis present

## 2022-09-18 DIAGNOSIS — A419 Sepsis, unspecified organism: Principal | ICD-10-CM

## 2022-09-18 LAB — CBC WITH DIFFERENTIAL/PLATELET
Abs Immature Granulocytes: 0.39 10*3/uL — ABNORMAL HIGH (ref 0.00–0.07)
Basophils Absolute: 0.1 10*3/uL (ref 0.0–0.1)
Basophils Relative: 0 %
Eosinophils Absolute: 0 10*3/uL (ref 0.0–0.5)
Eosinophils Relative: 0 %
HCT: 22.5 % — ABNORMAL LOW (ref 39.0–52.0)
Hemoglobin: 7.4 g/dL — ABNORMAL LOW (ref 13.0–17.0)
Immature Granulocytes: 2 %
Lymphocytes Relative: 6 %
Lymphs Abs: 1.4 10*3/uL (ref 0.7–4.0)
MCH: 27.9 pg (ref 26.0–34.0)
MCHC: 32.9 g/dL (ref 30.0–36.0)
MCV: 84.9 fL (ref 80.0–100.0)
Monocytes Absolute: 1.5 10*3/uL — ABNORMAL HIGH (ref 0.1–1.0)
Monocytes Relative: 7 %
Neutro Abs: 19.1 10*3/uL — ABNORMAL HIGH (ref 1.7–7.7)
Neutrophils Relative %: 85 %
Platelets: 997 10*3/uL (ref 150–400)
RBC: 2.65 MIL/uL — ABNORMAL LOW (ref 4.22–5.81)
RDW: 21.2 % — ABNORMAL HIGH (ref 11.5–15.5)
WBC: 22.5 10*3/uL — ABNORMAL HIGH (ref 4.0–10.5)
nRBC: 0 % (ref 0.0–0.2)

## 2022-09-18 LAB — COMPREHENSIVE METABOLIC PANEL
ALT: 57 U/L — ABNORMAL HIGH (ref 0–44)
ALT: 53 U/L — ABNORMAL HIGH (ref 0–44)
AST: 76 U/L — ABNORMAL HIGH (ref 15–41)
AST: 60 U/L — ABNORMAL HIGH (ref 15–41)
Albumin: 2.5 g/dL — ABNORMAL LOW (ref 3.5–5.0)
Albumin: 2.7 g/dL — ABNORMAL LOW (ref 3.5–5.0)
Alkaline Phosphatase: 136 U/L — ABNORMAL HIGH (ref 38–126)
Alkaline Phosphatase: 134 U/L — ABNORMAL HIGH (ref 38–126)
Anion gap: 15 (ref 5–15)
Anion gap: 12 (ref 5–15)
BUN: 14 mg/dL (ref 6–20)
BUN: 16 mg/dL (ref 6–20)
CO2: 22 mmol/L (ref 22–32)
CO2: 22 mmol/L (ref 22–32)
Calcium: 8.8 mg/dL — ABNORMAL LOW (ref 8.9–10.3)
Calcium: 8.7 mg/dL — ABNORMAL LOW (ref 8.9–10.3)
Chloride: 97 mmol/L — ABNORMAL LOW (ref 98–111)
Chloride: 99 mmol/L (ref 98–111)
Creatinine, Ser: 0.96 mg/dL (ref 0.61–1.24)
Creatinine, Ser: 1.18 mg/dL (ref 0.61–1.24)
GFR, Estimated: >60 mL/min (ref >60)
GFR, Estimated: >60 mL/min (ref >60)
Glucose, Bld: 126 mg/dL — ABNORMAL HIGH (ref 70–99)
Glucose, Bld: 118 mg/dL — ABNORMAL HIGH (ref 70–99)
Potassium: 4.2 mmol/L (ref 3.5–5.1)
Potassium: 4.1 mmol/L (ref 3.5–5.1)
Sodium: 134 mmol/L — ABNORMAL LOW (ref 135–145)
Sodium: 133 mmol/L — ABNORMAL LOW (ref 135–145)
Total Bilirubin: 0.6 mg/dL (ref 0.3–1.2)
Total Bilirubin: 0.5 mg/dL (ref 0.3–1.2)
Total Protein: 8.2 g/dL — ABNORMAL HIGH (ref 6.5–8.1)
Total Protein: 8.6 g/dL — ABNORMAL HIGH (ref 6.5–8.1)

## 2022-09-18 LAB — CBC
HCT: 24.4 % — ABNORMAL LOW (ref 39.0–52.0)
Hemoglobin: 8 g/dL — ABNORMAL LOW (ref 13.0–17.0)
MCH: 28.3 pg (ref 26.0–34.0)
MCHC: 32.8 g/dL (ref 30.0–36.0)
MCV: 86.2 fL (ref 80.0–100.0)
Platelets: 989 10*3/uL (ref 150–400)
RBC: 2.83 MIL/uL — ABNORMAL LOW (ref 4.22–5.81)
RDW: 20.8 % — ABNORMAL HIGH (ref 11.5–15.5)
WBC: 19.5 10*3/uL — ABNORMAL HIGH (ref 4.0–10.5)
nRBC: 0 % (ref 0.0–0.2)

## 2022-09-18 LAB — LIPASE, BLOOD: Lipase: 53 U/L — ABNORMAL HIGH (ref 11–51)

## 2022-09-18 LAB — PROTIME-INR
INR: 1.2 (ref 0.8–1.2)
INR: 1.2 (ref 0.8–1.2)
Prothrombin Time: 15.8 seconds — ABNORMAL HIGH (ref 11.4–15.2)
Prothrombin Time: 15.5 seconds — ABNORMAL HIGH (ref 11.4–15.2)

## 2022-09-18 LAB — RAPID URINE DRUG SCREEN, HOSP PERFORMED
Amphetamines: NOT DETECTED
Barbiturates: NOT DETECTED
Benzodiazepines: NOT DETECTED
Cocaine: NOT DETECTED
Opiates: POSITIVE — AB
Tetrahydrocannabinol: NOT DETECTED

## 2022-09-18 LAB — URINALYSIS, W/ REFLEX TO CULTURE (INFECTION SUSPECTED)
Bilirubin Urine: NEGATIVE
Glucose, UA: NEGATIVE mg/dL
Hgb urine dipstick: NEGATIVE
Ketones, ur: NEGATIVE mg/dL
Leukocytes,Ua: NEGATIVE
Nitrite: NEGATIVE
Protein, ur: 30 mg/dL — AB
Specific Gravity, Urine: 1.015 (ref 1.005–1.030)
pH: 5 (ref 5.0–8.0)

## 2022-09-18 LAB — I-STAT CG4 LACTIC ACID, ED: Lactic Acid, Venous: 1.7 mmol/L (ref 0.5–1.9)

## 2022-09-18 LAB — APTT: aPTT: 35 seconds (ref 24–36)

## 2022-09-18 LAB — RESP PANEL BY RT-PCR (RSV, FLU A&B, COVID)  RVPGX2
Influenza A by PCR: NEGATIVE
Influenza B by PCR: NEGATIVE
Resp Syncytial Virus by PCR: NEGATIVE
SARS Coronavirus 2 by RT PCR: NEGATIVE

## 2022-09-18 MED ORDER — MAGNESIUM OXIDE -MG SUPPLEMENT 400 (240 MG) MG PO TABS
400.0000 mg | ORAL_TABLET | Freq: Every day | ORAL | Status: DC
  Administered 2022-09-19 – 2022-10-02 (×13): 400 mg via ORAL
  Filled 2022-09-18 (×16): qty 1

## 2022-09-18 MED ORDER — NICOTINE 14 MG/24HR TD PT24
14.0000 mg | MEDICATED_PATCH | Freq: Every day | TRANSDERMAL | Status: DC
  Administered 2022-09-19 – 2022-10-02 (×14): 14 mg via TRANSDERMAL
  Filled 2022-09-18 (×14): qty 1

## 2022-09-18 MED ORDER — IOHEXOL 9 MG/ML PO SOLN
500.0000 mL | ORAL | Status: AC
  Administered 2022-09-18 (×2): 500 mL via ORAL

## 2022-09-18 MED ORDER — LACTATED RINGERS IV SOLN: INTRAVENOUS | Status: AC

## 2022-09-18 MED ORDER — SODIUM CHLORIDE 0.9 % IV SOLN
2.0000 g | Freq: Three times a day (TID) | INTRAVENOUS | Status: DC
  Administered 2022-09-18 – 2022-09-19 (×2): 2 g via INTRAVENOUS
  Filled 2022-09-18 (×2): qty 12.5

## 2022-09-18 MED ORDER — ADULT MULTIVITAMIN W/MINERALS CH
1.0000 | ORAL_TABLET | Freq: Every day | ORAL | Status: DC
  Administered 2022-09-19 – 2022-10-02 (×11): 1 via ORAL
  Filled 2022-09-18 (×12): qty 1

## 2022-09-18 MED ORDER — SODIUM CHLORIDE 0.9 % IV SOLN
10.0000 mg/kg | Freq: Every day | INTRAVENOUS | Status: DC
  Administered 2022-09-18 – 2022-10-01 (×14): 700 mg via INTRAVENOUS
  Filled 2022-09-18 (×16): qty 14

## 2022-09-18 MED ORDER — SUCRALFATE 1 G PO TABS
1.0000 g | ORAL_TABLET | Freq: Four times a day (QID) | ORAL | Status: DC
  Administered 2022-09-19 – 2022-10-01 (×39): 1 g via ORAL
  Filled 2022-09-18 (×45): qty 1

## 2022-09-18 MED ORDER — ONDANSETRON HCL 4 MG/2ML IJ SOLN
4.0000 mg | Freq: Four times a day (QID) | INTRAMUSCULAR | Status: DC | PRN
  Administered 2022-09-18 – 2022-09-20 (×2): 4 mg via INTRAVENOUS
  Filled 2022-09-18 (×2): qty 2

## 2022-09-18 MED ORDER — MORPHINE SULFATE (PF) 2 MG/ML IV SOLN: 2.0000 mg | INTRAVENOUS | Status: DC | PRN

## 2022-09-18 MED ORDER — PANCRELIPASE (LIP-PROT-AMYL) 12000-38000 UNITS PO CPEP
12000.0000 [IU] | ORAL_CAPSULE | Freq: Three times a day (TID) | ORAL | Status: DC
  Administered 2022-09-19 – 2022-09-30 (×28): 12000 [IU] via ORAL
  Filled 2022-09-18 (×42): qty 1

## 2022-09-18 MED ORDER — VANCOMYCIN 50 MG/ML ORAL SOLUTION
125.0000 mg | Freq: Four times a day (QID) | ORAL | Status: DC
  Administered 2022-09-19 – 2022-09-20 (×3): 125 mg via ORAL
  Filled 2022-09-18 (×10): qty 2.5

## 2022-09-18 MED ORDER — THIAMINE MONONITRATE 100 MG PO TABS
100.0000 mg | ORAL_TABLET | Freq: Every day | ORAL | Status: DC
  Administered 2022-09-19 – 2022-10-02 (×11): 100 mg via ORAL
  Filled 2022-09-18 (×12): qty 1

## 2022-09-18 MED ORDER — TRAMADOL HCL 50 MG PO TABS
50.0000 mg | ORAL_TABLET | Freq: Three times a day (TID) | ORAL | Status: DC | PRN
  Administered 2022-09-19 – 2022-09-29 (×21): 50 mg via ORAL
  Filled 2022-09-18 (×21): qty 1

## 2022-09-18 MED ORDER — ENOXAPARIN SODIUM 40 MG/0.4ML IJ SOSY
40.0000 mg | PREFILLED_SYRINGE | INTRAMUSCULAR | Status: DC
  Administered 2022-09-18 – 2022-09-29 (×12): 40 mg via SUBCUTANEOUS
  Filled 2022-09-18 (×12): qty 0.4

## 2022-09-18 MED ORDER — HYDROMORPHONE HCL 1 MG/ML IJ SOLN
1.0000 mg | Freq: Once | INTRAMUSCULAR | Status: AC
  Administered 2022-09-18: 1 mg via INTRAVENOUS
  Filled 2022-09-18: qty 1

## 2022-09-18 MED ORDER — METRONIDAZOLE 500 MG/100ML IV SOLN
500.0000 mg | Freq: Two times a day (BID) | INTRAVENOUS | Status: DC
  Administered 2022-09-19: 500 mg via INTRAVENOUS
  Filled 2022-09-18: qty 100

## 2022-09-18 MED ORDER — IOHEXOL 350 MG/ML SOLN
75.0000 mL | Freq: Once | INTRAVENOUS | Status: AC | PRN
  Administered 2022-09-18: 75 mL via INTRAVENOUS

## 2022-09-18 MED ORDER — ONDANSETRON HCL 4 MG PO TABS: 4.0000 mg | ORAL_TABLET | Freq: Four times a day (QID) | ORAL | Status: DC | PRN

## 2022-09-18 MED ORDER — METHOCARBAMOL 500 MG PO TABS: 500.0000 mg | ORAL_TABLET | Freq: Four times a day (QID) | ORAL | Status: DC | PRN

## 2022-09-18 MED ORDER — LACTATED RINGERS IV BOLUS (SEPSIS)
1000.0000 mL | Freq: Once | INTRAVENOUS | Status: AC
  Administered 2022-09-18: 1000 mL via INTRAVENOUS

## 2022-09-18 MED ORDER — FOLIC ACID 1 MG PO TABS
1.0000 mg | ORAL_TABLET | Freq: Every day | ORAL | Status: DC
  Administered 2022-09-19 – 2022-10-02 (×11): 1 mg via ORAL
  Filled 2022-09-18 (×12): qty 1

## 2022-09-18 MED ORDER — FERROUS SULFATE 325 (65 FE) MG PO TABS
325.0000 mg | ORAL_TABLET | Freq: Every day | ORAL | Status: DC
  Administered 2022-09-19 – 2022-10-02 (×11): 325 mg via ORAL
  Filled 2022-09-18 (×12): qty 1

## 2022-09-18 MED ORDER — PANTOPRAZOLE SODIUM 40 MG PO TBEC
40.0000 mg | DELAYED_RELEASE_TABLET | Freq: Two times a day (BID) | ORAL | Status: DC
  Administered 2022-09-19 – 2022-10-02 (×24): 40 mg via ORAL
  Filled 2022-09-18 (×25): qty 1

## 2022-09-18 MED ORDER — POLYETHYLENE GLYCOL 3350 17 G PO PACK: 17.0000 g | PACK | Freq: Every day | ORAL | Status: DC | PRN

## 2022-09-18 NOTE — Consult Note (Signed)
[facility] Jul 06, 1965  [id number].    Requesting MD: pneumoperitoneum Chief Complaint/Reason for Consult: Dr. Burnadette Pop  HPI:  Samuel [facility] is a 57 yo male with a history of alcohol-induced pancreatitis, for which he has had multiple hospital admissions over the last few months. He developed ascites, which was initially thought to be due to cirrhosis as he was noted to have nodularity of the liver on imaging. However on paracentesis, SAAG was not consistent with portal hypertension. Peritoneal fluid amylase has consistently been very high, consistent with pancreatic ascites. He has had multiple paracenteses, most recently 3 days ago (this fluid was not sent for culture). He previously grew VRE on peritoneal fluid cultures from 7/12.   Due to his recurrent pancreatic ascites and suspected pancreatic duct disruption, he went to [facility] yesterday for an ERCP. He was found to have pancreas divisum. Pancreatogram showed filling of the main PD to the proximal body of the pancreas, but no filling of the tail beyond the point of the leak. Deep cannulation of the duct was not successful, so a pancreaticogastrostomy stent was placed. He returned to [facility] following the procedure and left AMA this morning.  He returned to the ED this evening and reported abdominal pain, which he says began after he left the hospital this morning and stopped receiving pain medication. WBC is 22.5 (stable from yesterday). A CT scan in the ED showed continued ascites, with wall-off areas, small foci of free air in the upper abdomen. General surgery was consulted.  The patient denies any prior abdominal surgeries and is not on any blood thinners.   ROS: Review of Systems  Constitutional:  Positive for fever. Negative for chills.  Respiratory:  Negative for shortness of breath.   Gastrointestinal:  Positive for abdominal pain.    Family History  Problem Relation Age of Onset   Diabetes Mellitus II Neg Hx    Colon  cancer Neg Hx    Stomach cancer Neg Hx    Pancreatic cancer Neg Hx     Past Medical History:  Diagnosis Date   Alcohol withdrawal syndrome with complication (HCC)    Alcoholism (HCC)    Elevated AST (SGOT)    Gastrointestinal hemorrhage    Homeless    Hypertension    Pancreatic insufficiency    takes Creon   Symptomatic anemia 11/23/2015   Thrombocytopenia (HCC) 05/09/2021    Past Surgical History:  Procedure Laterality Date   BIOPSY  06/23/2019   Procedure: BIOPSY;  Surgeon: Shellia Cleverly, DO;  Location: [facility] ENDOSCOPY;  Service: Gastroenterology;;   BIOPSY  12/12/2020   Procedure: BIOPSY;  Surgeon: Lynann Bologna, MD;  Location: [facility] ENDOSCOPY;  Service: Endoscopy;;   BIOPSY  08/04/2021   Procedure: BIOPSY;  Surgeon: Jenel Lucks, MD;  Location: [facility] ENDOSCOPY;  Service: Gastroenterology;;   ESOPHAGOGASTRODUODENOSCOPY N/A 08/04/2021   Procedure: ESOPHAGOGASTRODUODENOSCOPY (EGD);  Surgeon: Jenel Lucks, MD;  Location: [facility] ENDOSCOPY;  Service: Gastroenterology;  Laterality: N/A;   ESOPHAGOGASTRODUODENOSCOPY (EGD) WITH PROPOFOL N/A 06/23/2019   Procedure: ESOPHAGOGASTRODUODENOSCOPY (EGD) WITH PROPOFOL;  Surgeon: Shellia Cleverly, DO;  Location: [facility] ENDOSCOPY;  Service: Gastroenterology;  Laterality: N/A;   ESOPHAGOGASTRODUODENOSCOPY (EGD) WITH PROPOFOL N/A 12/12/2020   Procedure: ESOPHAGOGASTRODUODENOSCOPY (EGD) WITH PROPOFOL;  Surgeon: Lynann Bologna, MD;  Location: [facility] ENDOSCOPY;  Service: Endoscopy;  Laterality: N/A;   HOT HEMOSTASIS N/A 06/23/2019   Procedure: HOT HEMOSTASIS (ARGON PLASMA COAGULATION/BICAP);  Surgeon: Shellia Cleverly, DO;  Location: [facility] ENDOSCOPY;  Service: Gastroenterology;  Laterality:  N/A;   IR PARACENTESIS  08/21/2022   IR PARACENTESIS  08/26/2022   IR PARACENTESIS  08/30/2022   IR PARACENTESIS  09/02/2022   IR PARACENTESIS  09/06/2022    Social History:  reports that he has been smoking cigarettes. He has a 15 pack-year smoking history. He has never  used smokeless tobacco. He reports current alcohol use of about 28.0 standard drinks of alcohol per week. He reports that he does not use drugs.  Allergies: No Known Allergies  7/123    Physical Exam: Blood pressure (!) 159/104, pulse (!) 109, temperature 98.1 F (36.7 C), resp. rate 16, height 5\' 9"  (1.753 m), weight 68.4 kg, SpO2 98%. General: resting comfortably, appears stated age, no apparent distress Neurological: alert and oriented, no focal deficits, cranial nerves grossly in tact HEENT: normocephalic, atraumatic CV: regular rate and rhythm, no murmurs, extremities warm and well-perfused Respiratory: normal work of breathing on room air Abdomen: mildly distended, diffusely tender to palpation Extremities: warm and well-perfused, no deformities, moving all extremities spontaneously Skin: warm and dry, no jaundice, no rashes or lesions   Results for orders placed or performed during the hospital encounter of 09/18/22 (from the past 48 hour(s))  Resp panel by RT-PCR (RSV, Flu A&B, Covid) Anterior Nasal Swab     Status: None   Collection Time: 09/18/22  3:54 PM   Specimen: Anterior Nasal Swab  Result Value Ref Range   SARS Coronavirus 2 by RT PCR NEGATIVE NEGATIVE   Influenza A by PCR NEGATIVE NEGATIVE   Influenza B by PCR NEGATIVE NEGATIVE    Comment: (NOTE) The Xpert Xpress SARS-CoV-2/FLU/RSV plus assay is intended as an aid in the diagnosis of influenza from Nasopharyngeal swab specimens and should not be used as a sole basis for treatment. Nasal washings and aspirates are unacceptable for Xpert Xpress SARS-CoV-2/FLU/RSV testing.  Fact Sheet for Patients: BloggerCourse.com  Fact Sheet for Healthcare Providers: SeriousBroker.it  This test is not yet approved or cleared by the [place] FDA and has been authorized for detection and/or diagnosis of SARS-CoV-2 by FDA under an Emergency Use Authorization (EUA). This  EUA will remain in effect (meaning this test can be used) for the duration of the COVID-19 declaration under Section 564(b)(1) of the Act, 21 U.S.C. section 360bbb-3(b)(1), unless the authorization is terminated or revoked.     Resp Syncytial Virus by PCR NEGATIVE NEGATIVE    Comment: (NOTE) Fact Sheet for Patients: BloggerCourse.com  Fact Sheet for Healthcare Providers: SeriousBroker.it  This test is not yet approved or cleared by the [place] FDA and has been authorized for detection and/or diagnosis of SARS-CoV-2 by FDA under an Emergency Use Authorization (EUA). This EUA will remain in effect (meaning this test can be used) for the duration of the COVID-19 declaration under Section 564(b)(1) of the Act, 21 U.S.C. section 360bbb-3(b)(1), unless the authorization is terminated or revoked.  Performed at [facility], [address], [place], [place] [address]   Comprehensive metabolic panel     Status: Abnormal   Collection Time: 09/18/22  4:37 PM  Result Value Ref Range   Sodium 133 (L) 135 - 145 mmol/L   Potassium 4.1 3.5 - 5.1 mmol/L   Chloride 99 98 - 111 mmol/L   CO2 22 22 - 32 mmol/L   Glucose, Bld 118 (H) 70 - 99 mg/dL    Comment: Glucose reference range applies only to samples taken after fasting for at least 8 hours.   BUN 16 6 -  20 mg/dL   Creatinine, Ser 1.61 0.61 - 1.24 mg/dL   Calcium 8.7 (L) 8.9 - 10.3 mg/dL   Total Protein 8.6 (H) 6.5 - 8.1 g/dL   Albumin 2.7 (L) 3.5 - 5.0 g/dL   AST 60 (H) 15 - 41 U/L   ALT 53 (H) 0 - 44 U/L   Alkaline Phosphatase 134 (H) 38 - 126 U/L   Total Bilirubin 0.5 0.3 - 1.2 mg/dL   GFR, Estimated >09 >60 mL/min    Comment: (NOTE) Calculated using the CKD-EPI Creatinine Equation (2021)    Anion gap 12 5 - 15    Comment: Performed at [facility], [address], [place], [place] [address]  CBC with Differential     Status: Abnormal   Collection Time: 09/18/22   4:37 PM  Result Value Ref Range   WBC 22.5 (H) 4.0 - 10.5 K/uL   RBC 2.65 (L) 4.22 - 5.81 MIL/uL   Hemoglobin 7.4 (L) 13.0 - 17.0 g/dL   HCT 81.1 (L) 91.4 - 78.2 %   MCV 84.9 80.0 - 100.0 fL   MCH 27.9 26.0 - 34.0 pg   MCHC 32.9 30.0 - 36.0 g/dL   RDW 95.6 (H) 21.3 - 08.6 %   Platelets 997 (HH) 150 - 400 K/uL    Comment: CRITICAL VALUE NOTED.  VALUE IS CONSISTENT WITH PREVIOUSLY REPORTED AND CALLED VALUE. REPEATED TO VERIFY    nRBC 0.0 0.0 - 0.2 %   Neutrophils Relative % 85 %   Neutro Abs 19.1 (H) 1.7 - 7.7 K/uL   Lymphocytes Relative 6 %   Lymphs Abs 1.4 0.7 - 4.0 K/uL   Monocytes Relative 7 %   Monocytes Absolute 1.5 (H) 0.1 - 1.0 K/uL   Eosinophils Relative 0 %   Eosinophils Absolute 0.0 0.0 - 0.5 K/uL   Basophils Relative 0 %   Basophils Absolute 0.1 0.0 - 0.1 K/uL   Immature Granulocytes 2 %   Abs Immature Granulocytes 0.39 (H) 0.00 - 0.07 K/uL    Comment: Performed at [facility], [address], [place], [place] [address]  Protime-INR     Status: Abnormal   Collection Time: 09/18/22  4:37 PM  Result Value Ref Range   Prothrombin Time 15.5 (H) 11.4 - 15.2 seconds   INR 1.2 0.8 - 1.2    Comment: (NOTE) INR goal varies based on device and disease states. Performed at [facility], [address], [place], [place] [address]   APTT     Status: None   Collection Time: 09/18/22  4:37 PM  Result Value Ref Range   aPTT 35 24 - 36 seconds    Comment: Performed at [facility], [address] [place], [place] [address]  Rapid urine drug screen (hospital performed)     Status: Abnormal   Collection Time: 09/18/22  5:41 PM  Result Value Ref Range   Opiates POSITIVE (A) NONE DETECTED   Cocaine NONE DETECTED NONE DETECTED   Benzodiazepines NONE DETECTED NONE DETECTED   Amphetamines NONE DETECTED NONE DETECTED   Tetrahydrocannabinol NONE DETECTED NONE DETECTED   Barbiturates NONE DETECTED NONE DETECTED    Comment: (NOTE) DRUG SCREEN FOR MEDICAL  PURPOSES ONLY.  IF CONFIRMATION IS NEEDED FOR ANY PURPOSE, NOTIFY LAB WITHIN 5 DAYS.  LOWEST DETECTABLE LIMITS FOR URINE DRUG SCREEN Drug Class                     Cutoff (  ng/mL) Amphetamine and metabolites    1000 Barbiturate and metabolites    200 Benzodiazepine                 200 Opiates and metabolites        300 Cocaine and metabolites        300 THC                            50 Performed at [facility], [address], [place], [place] [address]   I-Stat Lactic Acid, ED     Status: None   Collection Time: 09/18/22  5:44 PM  Result Value Ref Range   Lactic Acid, Venous 1.7 0.5 - 1.9 mmol/L  Urinalysis, w/ Reflex to Culture (Infection Suspected) -Urine, Clean Catch     Status: Abnormal   Collection Time: 09/18/22  6:04 PM  Result Value Ref Range   Specimen Source URINE, CLEAN CATCH    Color, Urine YELLOW YELLOW   APPearance HAZY (A) CLEAR   Specific Gravity, Urine 1.015 1.005 - 1.030   pH 5.0 5.0 - 8.0   Glucose, UA NEGATIVE NEGATIVE mg/dL   Hgb urine dipstick NEGATIVE NEGATIVE   Bilirubin Urine NEGATIVE NEGATIVE   Ketones, ur NEGATIVE NEGATIVE mg/dL   Protein, ur 30 (A) NEGATIVE mg/dL   Nitrite NEGATIVE NEGATIVE   Leukocytes,Ua NEGATIVE NEGATIVE   RBC / HPF 0-5 0 - 5 RBC/hpf   WBC, UA 0-5 0 - 5 WBC/hpf    Comment:        Reflex urine culture not performed if WBC <=10, OR if Squamous epithelial cells >5. If Squamous epithelial cells >5 suggest recollection.    Bacteria, UA RARE (A) NONE SEEN   Squamous Epithelial / HPF 0-5 0 - 5 /HPF   Mucus PRESENT    Hyaline Casts, UA PRESENT     Comment: Performed at [facility], [address] [place], [place] [address]   CT ABDOMEN PELVIS W CONTRAST  Result Date: 09/18/2022 CLINICAL DATA:  Abdominal pain EXAM: CT ABDOMEN AND PELVIS WITH CONTRAST TECHNIQUE: Multidetector CT imaging of the abdomen and pelvis was performed using the standard protocol following bolus administration of intravenous contrast.  RADIATION DOSE REDUCTION: This exam was performed according to the departmental dose-optimization program which includes automated exposure control, adjustment of the mA and/or kV according to patient size and/or use of iterative reconstruction technique. CONTRAST:  75mL OMNIPAQUE IOHEXOL 350 MG/ML SOLN COMPARISON:  08/26/2022 FINDINGS: Lower chest: There is interval increase in size of right pleural effusion. There is infiltrate with air bronchograms in right lower lung field suggesting worsening of atelectasis. Subsegmental atelectasis is seen in the posterior left lower lung field. There is ectasia of bronchi in the lower lung fields. Hepatobiliary: No focal abnormalities are seen in the liver. There is no dilation of bile ducts. Gallbladder is not distended. Pancreas: Catheter is seen in head and body pancreas extending into the stomach and second portion of duodenum. There is no significant dilation of pancreatic duct. Small calcifications are seen in the head of the pancreas. Spleen: Unremarkable. Adrenals/Urinary Tract: Adrenals are unremarkable. There is no hydronephrosis. There are no renal or ureteral stones. Urinary bladder is unremarkable. Stomach/Bowel: Stomach is unremarkable. There is no significant dilation of small bowel loops. Appendix is not distinctly visualized. There is no wall thickening in colon. Vascular/Lymphatic: Scattered arterial calcifications are seen in aorta and its major branches. Reproductive: Unremarkable. Other:  There is moderate ascites with interval decrease. There is 11.2 x 6.1 cm loculated fluid collection with thick wall inferior to the liver. There is loculated fluid collection between stomach and pancreas measuring proximally 11.8 cm in maximum diameter without thick wall. There is 9.8 by 4.4 cm loculated fluid collection in the left lower quadrant abdomen with slightly thickened wall. There are pockets pneumoperitoneum in upper and mid abdomen. Musculoskeletal:  Degenerative changes are noted in lumbar spine, most severe at L5-S1 level. This finding has not changed. IMPRESSION: There is no evidence of intestinal obstruction. There is no hydronephrosis. There are multiple pockets of pneumoperitoneum in upper and mid abdomen. This may be related to recent paracentesis or suggest bowel perforation or infectious peritonitis. There is interval decrease in amount of ascites. Still, there is moderate residual ascites in abdomen and pelvis. There is a loculated thick-walled fluid collection inferior to the right lobe of liver measuring 11.2 cm in maximum diameter. There is another loculated fluid collection measuring 9.8 cm in maximum diameter with slightly thickened wall in left mid to lower abdomen. Possibility of infectious process in the peritoneal cavity is not excluded. Please correlate with laboratory findings and consider sampling of loculated fluid in right subhepatic location. There is interval worsening of infiltrates in the lower lung fields suggesting atelectasis/pneumonia. There is interval increase in right pleural effusion. Catheter is noted extending from the stomach through the pancreas to the duodenum. Electronically Signed   By: Ernie Avena M.D.   On: 09/18/2022 18:45   DG Chest Port 1 View  Result Date: 09/18/2022 CLINICAL DATA:  Sepsis EXAM: PORTABLE CHEST 1 VIEW COMPARISON:  X-ray 09/05/2022 and older FINDINGS: Normal cardiopericardial silhouette. Calcified aorta. Left lung is clear. No left-sided consolidation, pneumothorax or effusion. Overlapping cardiac leads. There is a small to moderate right pleural effusion with some adjacent opacity. No edema. There is no clear pneumothorax identified although there is horizontal appearance of the margin of the pleural effusion. Hydropneumothorax is not excluded with this appearance. IMPRESSION: Developing small to moderate right pleural effusion with some adjacent opacity. In addition there is a  horizontal orientation to the pleural effusion. Although no pneumothorax is seen, a hydropneumothorax is not excluded with this appearance. Recommend follow up chest CT to further delineate when appropriate. Electronically Signed   By: Karen Kays M.D.   On: 09/18/2022 17:23      Assessment/Plan This is a 57 yo male with a history of EtOH-induced pancreatitis, now with a pancreatic duct injury and infected pancreatic ascites. He underwent attempted ERCP which was not successful due to pancreas divisum, thus an EUS with pancreaticogastrostomy was performed. He now has abdominal pain and small foci of pneumoperitoneum. I extensively reviewed his prior progress notes, procedure notes, labs and imaging. CT shows decreased ascites compared to prior. Pancreaticogastrostomy stent appears appropriately positioned, with the ends in place in the duodenum and stomach. There is fluid in the lesser sac but no gas to suggest leakage at the site of the stent. Ascites is organizing into walled-off collections. He does have significant abdominal tenderness on exam, however it seems that he has had a significant amount of baseline abdominal pain over the last few weeks. He is quite tender but he has also had documented bacterial peritonitis, which could also be the source of his pain and locules of air. His recent paracentesis could also be the source of his free air. He is currently stable and lactate is normal. Laparoscopic entrance into the abdomen would  likely be very difficult in the setting of his loculated ascites, and laparotomy can be very morbid with ongoing pancreatic ascites. As he is currently stable, will defer operative exploration and proceed with a CT with oral contrast only to further evaluate for a hollow viscous injury. Perforation from recent endoscopy is certainly a possibility. This will not definitively exclude a perforation, but if there is definitive evidence of ongoing leak I will proceed with  laparotomy. If the CT is negative but the patient deteriorates clinically, I would also recommend surgical exploration. I discussed this plan with the patient at bedside, and reviewed that surgery would include exploratory laparotomy with possible bowel resection vs primary repair of an injury. - Strict NPO - IV antibiotics - gentamicin started based on previous fluid cultures - CT with oral contrast pending - IR consult for drainage of walled-off peritoneal collections in am  - Surgery will follow closely. Low threshold for surgical exploration if patient worsens clinically.   Sophronia Simas, MD [facility] General, Hepatobiliary and Pancreatic Surgery 09/18/22 9:51 PM

## 2022-09-18 NOTE — ED Notes (Signed)
Vancomycin not received before patient went up to a room

## 2022-09-18 NOTE — ED Notes (Signed)
Patient transported to CT 

## 2022-09-18 NOTE — Sepsis Progress Note (Addendum)
Code Sepsis protocol being monitored by eLink. Uncertain if pt will stay for treatment. PA aware of need to place antibiotic orders.

## 2022-09-18 NOTE — ED Triage Notes (Signed)
I'm sl confused  this man reports that he was in [place] yesterday to have a surgery on his lt pelvis??? I think he signed out ama because he had bills to pay and could not stay  I cannot figure out what type surgery he was supposed to have

## 2022-09-18 NOTE — Plan of Care (Signed)
  Problem: Fluid Volume: Goal: Hemodynamic stability will improve Outcome: Progressing   Problem: Clinical Measurements: Goal: Diagnostic test results will improve Outcome: Progressing Goal: Signs and symptoms of infection will decrease Outcome: Progressing   Problem: Education: Goal: Knowledge of General Education information will improve Description: Including pain rating scale, medication(s)/side effects and non-pharmacologic comfort measures Outcome: Progressing   Problem: Health Behavior/Discharge Planning: Goal: Ability to manage health-related needs will improve Outcome: Progressing   Problem: Clinical Measurements: Goal: Ability to maintain clinical measurements within normal limits will improve Outcome: Progressing Goal: Will remain free from infection Outcome: Progressing Goal: Diagnostic test results will improve Outcome: Progressing Goal: Respiratory complications will improve Outcome: Progressing Goal: Cardiovascular complication will be avoided Outcome: Progressing   Problem: Activity: Goal: Risk for activity intolerance will decrease Outcome: Progressing   Problem: Nutrition: Goal: Adequate nutrition will be maintained Outcome: Progressing   Problem: Coping: Goal: Level of anxiety will decrease Outcome: Progressing   Problem: Elimination: Goal: Will not experience complications related to bowel motility Outcome: Progressing Goal: Will not experience complications related to urinary retention Outcome: Progressing   Problem: Pain Managment: Goal: General experience of comfort will improve Outcome: Progressing   Problem: Safety: Goal: Ability to remain free from injury will improve Outcome: Progressing   Problem: Skin Integrity: Goal: Risk for impaired skin integrity will decrease Outcome: Progressing   Problem: Education: Goal: Ability to describe self-care measures that may prevent or decrease complications (Diabetes Survival Skills  Education) will improve Outcome: Progressing Goal: Individualized Educational Video(s) Outcome: Progressing   Problem: Coping: Goal: Ability to adjust to condition or change in health will improve Outcome: Progressing   Problem: Fluid Volume: Goal: Ability to maintain a balanced intake and output will improve Outcome: Progressing   Problem: Health Behavior/Discharge Planning: Goal: Ability to identify and utilize available resources and services will improve Outcome: Progressing Goal: Ability to manage health-related needs will improve Outcome: Progressing   Problem: Metabolic: Goal: Ability to maintain appropriate glucose levels will improve Outcome: Progressing   Problem: Nutritional: Goal: Maintenance of adequate nutrition will improve Outcome: Progressing Goal: Progress toward achieving an optimal weight will improve Outcome: Progressing   Problem: Skin Integrity: Goal: Risk for impaired skin integrity will decrease Outcome: Progressing   Problem: Tissue Perfusion: Goal: Adequacy of tissue perfusion will improve Outcome: Progressing

## 2022-09-18 NOTE — Progress Notes (Addendum)
AMA discharge note   Samuel Blevins  AVW:[id number] DOB: 28-Jun-1965 DOA: 08/26/2022 PCP: Samuel Frisk, MD   Brief Narrative:  57 year old with history of recurrent pancreatitis, chronic pancreatic insufficiency, HTN , recurrent alcohol use comes to the hospital with complaints of abdominal pain, nausea, vomiting and diarrhea.  Patient was also found to have acute recurrent alcoholic pancreatitis.  Initially patient had left AMA about 4-5 days prior to this admission.  Patient readmitted for acute recurrent alcoholic pancreatitis with worsening ascites, initial labs of ascites fluid indicate elevated amylase concerning for pancreatic duct disruption.    Hospital course complicated with presence of recurrent ascites, infected ascites fluid, ongoing abdominal pain and acute renal failure.  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 Being followed by GI, nephrology, and ID.   GI previously recommending transfer to outside facility for ERCP to attempt to stent pancreatic duct disruption. [facility] accepted patient for procedure on 09/17/2022.  Procedure was apparently difficult and required prolonged procedure with difficult manipulation of the pancreas but stent was successfully placed as planned.  Patient's NG tube was instantly removed by staff during procedure then purposefully removed by patient post-anesthesia.  Patient now acutely decided that he needed to "run errands and get his welfare check" and refuses to stay in the hospital any further for any ongoing evaluation workup or treatment.  We discussed the extremely high risk of patient's new stent placement as well as his robust medical history.  Patient agrees to return back to the facility once he has picked up his check, if he does not return by end of day we discussed case with infectious disease and will prescribe linezolid at discharge for ongoing  coverage of infection.  Unclear at this time if patient will be willing to get back to [facility] campus for repeat ERCP which was planned in the next 2 weeks.  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 Corliss Parish.  Plan to repeat ERCP at their facility in 2 weeks to evaluate stent and likely replaced with larger diameter stent. -Sepsis evidenced by tachycardia and leukocytosis - source: klebsiella ascites vs cdiff (both presumed POA). -Paracentesis 7/1 - 3L; 7/5 - 3L; 7/8 2L, 7/12 1.7 L. 7/21 600cc Echo: EF 60-65% with normal LVF and grade 1 diastolic dysfunction -Multiple Cortrak's removed by patient -dislodged again intentionally 7/23 at [facility]; will keep NG out; advance diet as tolerated -Cultures(blood and paracentesis) initially negative however cultures on paracentesis from 7/12 shows vancomycin resistant enterococcus - on daptomycin per ID Plans to transition to liezolid for remainder of course if patient does not report back to the ED -Urine culture shows multiple species, likely contaminant -Paracentesis 7/21 appears somewhat loculated, with diminishing returns, this is to be expected per discussion with radiology as we repeat his paracenteses over time  Improving C. difficile colitis:  Stool positive for C. Difficile PO vancomycin  will need to continue after completion of dapto per ID.   Anemia of chronic disease:  Hemoglobin downtrend ongoing, 6.8 today - patient has chart flagged for blood product refusal but he verbally agreed to blood and wants the flag removed. 1u PRBC to transfuse today pending type/screen  Thrombocytosis -Questionably reactive in the setting of above infection/anemai.  Hypoalbuminemia Hypovolemic hyponatremia -Secondary to above, improving with p.o. intake as NG tube has been removed we will not replace it -Advance diet as tolerated, cortrack pulled again in outside facility  Recurrent alcohol induced pancreatitis. Nausea vomiting, resolved -Secondary to chronic alcohol use/abuse p.o. intake as tolerated, pain control avoiding IV narcotics, antiemetics, PPI   Severe protein caloric malnutrition High risk for refeeding syndrome -Complicated by chronic alcohol use -Cortrack placed 7/5 -dysfunctional 7/6 -replaced 09/02/2022, noted to be distal to the proximal duodenum.   - Multiple cortrack tubes accidentally versus intentionally dislodged and removed as above. -Continue to follow electrolytes, mag/Phos. Repleted as appropriate -Continue tube feeds - diet ordered but limited PO intake to decrease pancreatic output   Alcohol abuse - Alcohol withdrawal protocol completed, currently outside the window for any further withdrawal symptoms.  No further need for benzos IV versus p.o.    Essential hypertension -Currently well-controlled off medications, as needed hydralazine for hypertensive events   GERD/duodenitis -Continue PPI   Acute kidney injury with hyperkalemia: Followed by nephrology.  Treated with Lokelma.  Urine output is adequate.  Slight improvement today.  Acute hypoxic respiratory failure, transient and resolved:  Acute episode overnight on the night of 09/05/2022 Chest x-ray reported atelectasis, likely secondary to enlarging ascites. Improved s/p  paracentesis  Noncompliance, profound Acute metabolic/toxic encephalopathy, resolved -Multiple episodes of abnormal behavior previously, multiple episodes of Cortrak removal, nursing staff noted patient eating cigarettes last week.   -Remains alert and oriented, patient not leaving AMA despite multiple conversations with myself and staff in regards to his increased morbidity mortality and high risk of COVID illness.  DVT prophylaxis: enoxaparin (LOVENOX) injection 40 mg Start: 09/11/22 1800 Code Status:   Code Status: Full Code Family Communication: None present  Status is: Inpatient  Dispo: The patient is from: Home              Anticipated d/c is to: Patient left AMA  Consultants:  GI, ID, nephrology  Procedures:  Multiple cortrack placements, multiple paracentesis, EEG  Antimicrobials:  Daptomycin, p.o. vancomycin  Subjective: No acute issues or events overnight, tolerating p.o. quite well pain currently controlled but patient refuses to stay in the hospital demanding to be released which we discussed was not appropriate, he has filled out AMA paperwork and left the campus this morning  Objective: Vitals:   09/17/22 0736 09/17/22 2058 09/18/22 0442 09/18/22 0811  BP: (!) 154/92 (!) 129/93  112/76  Pulse:  (!) 103 (!) 108 (!) 110  Resp:  18 18 18   Temp:  98.8 F (37.1 C) 98.7 F (37.1 C) 98.2 F (36.8 C)  TempSrc:  Oral Oral Oral  SpO2:  98% 96% 98%  Weight:   68.4 kg   Height:        Intake/Output Summary (Last 24 hours) at 09/18/2022 0838 Last data filed at 09/18/2022 0800 Gross per 24 hour  Intake 240 ml  Output 650 ml  Net -410 ml   Filed Weights   09/16/22 0519 09/17/22 0421 09/18/22 0442  Weight: 71.1 kg 68.3 kg 68.4 kg  Examination:  General: Awake alert, sitting at bedside feeling garbage bags follow close personal warnings oriented x 3 Lungs: Without overt wheezes rales or rhonchi. Heart: Regular rate rhythm no murmurs rubs or gallops. Abdomen:  Diffusely tender, minimally distended but soft without rebound or guarding   LOS: 22 days   Time spent:  Azucena Fallen, DO [facility]  If 7PM-7AM, please contact night-coverage [email or web address]  09/18/2022, 8:38 AM

## 2022-09-18 NOTE — H&P (Addendum)
History and Physical    Samuel Blevins ZOX:[id number] DOB: 01-Jul-1965 DOA: 09/18/2022  PCP: [name], MD   Patient coming from: Home    Chief Complaint: Abdominal pain  HPI: Samuel Blevins is a 57 y.o. male with medical history significant of recurrent pancreatitis,chronic pancreatic insufficiency, HTN , recurrent alcohol use presented to the emergency department this afternoon with complaint of abdominal pain.  He just left AMA this morning after a prolonged hospitalization.  He also had left AMA about 4 to 5 days prior to earlier admission.  Patient was readmitted for acute recurrent alcoholic pancreatitis with worsening ascites, concern for pancreatitis disruption.  Hospital course was remarkable for recurrent ascites, SBP, ongoing abdomen pain, acute renal failure.  GI, ID,  were following.There was concern for pancreatic duct disruption causing recurrent amyloid positive ascites ,SBP and ongoing pain   He was briefly transferred to [facility] to attempt for stent placement for biliary duct on 09/17/2022.  Procedure was apparently difficult and required prolonged procedure with difficult manipulation of the pancreas but stent was successfully placed as planned and he was transferred back to here.  There is plan for repeat ERCP in the next 2 weeks at [facility].  During last hospitalization, he was also found to have C. difficile and was on oral vancomycin.  He was on daptomycin after his ascitic  fluid culture showed vancomycin-resistant Enterococcus, Enterococcus faecium.  Patient suddenly decided to leave the hospital this afternoon AGAINST MEDICAL ADVICE. Patient presented back to the emergency department later today with abdominal pain. On presentation, he was mildly febrile, tachycardic with a stable blood pressure.  ED Course: Hemodynamically stable.  Mild grade fever earlier but repeat temperature check was normal.  Sinus tachycardia.  Lab work done earlier this morning had shown  leukocytosis of 19.5, hemoglobin of 8, thrombocytosis, lipase of 53, creatinine 0.9, mildly elevated liver enzymes.  Patient said he presented to the emergency department because he had severe abdominal pain.  Last bowel movement was this morning which was diarrhea.  Abdomen found to be slightly distended but very tender.  Patient denies shortness of breath, chest pain, nausea, vomiting, dysuria, hematochezia or melena. patient being readmitted.  Review of Systems: As per HPI otherwise 10 point review of systems negative.    Past Medical History:  Diagnosis Date   Alcohol withdrawal syndrome with complication (HCC)    Alcoholism (HCC)    Elevated AST (SGOT)    Gastrointestinal hemorrhage    Homeless    Hypertension    Pancreatic insufficiency    takes Creon   Symptomatic anemia 11/23/2015   Thrombocytopenia (HCC) 05/09/2021    Past Surgical History:  Procedure Laterality Date   BIOPSY  06/23/2019   Procedure: BIOPSY;  Surgeon: Shellia Cleverly, DO;  Location: [facility] ENDOSCOPY;  Service: Gastroenterology;;   BIOPSY  12/12/2020   Procedure: BIOPSY;  Surgeon: Lynann Bologna, MD;  Location: [facility] ENDOSCOPY;  Service: Endoscopy;;   BIOPSY  08/04/2021   Procedure: BIOPSY;  Surgeon: Jenel Lucks, MD;  Location: [facility] ENDOSCOPY;  Service: Gastroenterology;;   ESOPHAGOGASTRODUODENOSCOPY N/A 08/04/2021   Procedure: ESOPHAGOGASTRODUODENOSCOPY (EGD);  Surgeon: Jenel Lucks, MD;  Location: [facility] ENDOSCOPY;  Service: Gastroenterology;  Laterality: N/A;   ESOPHAGOGASTRODUODENOSCOPY (EGD) WITH PROPOFOL N/A 06/23/2019   Procedure: ESOPHAGOGASTRODUODENOSCOPY (EGD) WITH PROPOFOL;  Surgeon: Shellia Cleverly, DO;  Location: [facility] ENDOSCOPY;  Service: Gastroenterology;  Laterality: N/A;   ESOPHAGOGASTRODUODENOSCOPY (EGD) WITH PROPOFOL N/A 12/12/2020   Procedure: ESOPHAGOGASTRODUODENOSCOPY (EGD) WITH PROPOFOL;  Surgeon: Chales Abrahams,  Filbert Berthold, MD;  Location: [facility] ENDOSCOPY;  Service: Endoscopy;  Laterality: N/A;   HOT  HEMOSTASIS N/A 06/23/2019   Procedure: HOT HEMOSTASIS (ARGON PLASMA COAGULATION/BICAP);  Surgeon: Shellia Cleverly, DO;  Location: [facility] ENDOSCOPY;  Service: Gastroenterology;  Laterality: N/A;   IR PARACENTESIS  08/21/2022   IR PARACENTESIS  08/26/2022   IR PARACENTESIS  08/30/2022   IR PARACENTESIS  09/02/2022   IR PARACENTESIS  09/06/2022     reports that he has been smoking cigarettes. He has a 15 pack-year smoking history. He has never used smokeless tobacco. He reports current alcohol use of about 28.0 standard drinks of alcohol per week. He reports that he does not use drugs.  No Known Allergies  Family History  Problem Relation Age of Onset   Diabetes Mellitus II Neg Hx    Colon cancer Neg Hx    Stomach cancer Neg Hx    Pancreatic cancer Neg Hx      Prior to Admission medications   Medication Sig Start Date End Date Taking? Authorizing Provider  amLODipine (NORVASC) 5 MG tablet Take 5 mg by mouth daily.    [provider]  carvedilol (COREG) 3.125 MG tablet Take 1 tablet (3.125 mg total) by mouth 2 (two) times daily with a meal. 07/10/22   [name], MD  folic acid (FOLVITE) 1 MG tablet Take 1 tablet (1 mg total) by mouth daily. 07/14/22   Rolly Salter, MD  hydrOXYzine (ATARAX) 25 MG tablet Take 25 mg by mouth at bedtime as needed for anxiety.    [provider]  Iron, Ferrous Sulfate, 325 (65 Fe) MG TABS Take 1 tablet ( 325 mg) by mouth daily. 05/24/22   [name], MD  lipase/protease/amylase (CREON) 12000-38000 units CPEP capsule Take 1 capsule (12,000 Units total) by mouth 3 (three) times daily before meals. 07/14/22   Rolly Salter, MD  magnesium oxide (MAG-OX) 400 MG tablet Take 1 tablet (400 mg total) by mouth in the morning and at bedtime. 07/14/22   Rolly Salter, MD  Multiple Vitamin (MULTIVITAMIN WITH MINERALS) TABS tablet Take 1 tablet by mouth daily. 07/14/22   Rolly Salter, MD  nicotine (NICODERM CQ - DOSED IN MG/24 HOURS) 14 mg/24hr  patch Place 1 patch (14 mg total) onto the skin daily. Patient not taking: Reported on 08/21/2022 07/14/22   Rolly Salter, MD  ondansetron (ZOFRAN) 8 MG tablet Take 8 mg by mouth every 6 (six) hours as needed for nausea or vomiting.    [provider]  pantoprazole (PROTONIX) 40 MG tablet Take 1 tablet (40 mg total) by mouth 2 (two) times daily before a meal. 07/14/22   Rolly Salter, MD  polyethylene glycol (MIRALAX / GLYCOLAX) 17 g packet Take 17 g by mouth daily as needed for mild constipation or moderate constipation. 07/14/22   Rolly Salter, MD  sucralfate (CARAFATE) 1 g tablet Take 1 g by mouth See admin instructions. Take 1 tablet by mouth 4 times daily with meals and bedtime    [provider]  thiamine (VITAMIN B-1) 100 MG tablet Take 1 tablet (100 mg total) by mouth daily. 07/14/22   Rolly Salter, MD  traMADol (ULTRAM) 50 MG tablet Take 1 tablet (50 mg total) by mouth every 8 (eight) hours as needed for severe pain. 07/17/22   [name], MD    Physical Exam: Vitals:   09/18/22 1520 09/18/22 1522 09/18/22 1609 09/18/22 1652  BP:  122/69  126/83  Pulse:  (!) 127  (!) 122  Resp:  (!) 30  (!) 22  Temp:  100.1 F (37.8 C) (!) 100.7 F (38.2 C) 98.7 F (37.1 C)  TempSrc:  Oral Oral Oral  SpO2:  95%  100%  Weight: 68.4 kg     Height: 5\' 9"  (1.753 m)       Constitutional: appeared overall  comfortable Vitals:   09/18/22 1520 09/18/22 1522 09/18/22 1609 09/18/22 1652  BP:  122/69  126/83  Pulse:  (!) 127  (!) 122  Resp:  (!) 30  (!) 22  Temp:  100.1 F (37.8 C) (!) 100.7 F (38.2 C) 98.7 F (37.1 C)  TempSrc:  Oral Oral Oral  SpO2:  95%  100%  Weight: 68.4 kg     Height: 5\' 9"  (1.753 m)      Eyes: PERRL, lids and conjunctivae normal ENMT: Mucous membranes are moist.  Neck: normal, supple, no masses, no thyromegaly Respiratory: clear to auscultation bilaterally, no wheezing, no crackles. Normal respiratory effort. No accessory muscle use.   Cardiovascular: sinus tachy, no murmurs / rubs / gallops. No extremity edema.  Abdomen: generalized tenderness, moderately distended, Bowel sounds positive.  Musculoskeletal: no clubbing / cyanosis. No joint deformity upper and lower extremities.  Skin: no rashes, lesions, ulcers. No induration Neurologic: CN 2-12 grossly intact.  Strength 5/5 in all 4.  Psychiatric: Normal judgment and insight. Alert and oriented x 3. Normal mood.   Foley Catheter:None  Labs on Admission: I have personally reviewed following labs and imaging studies  CBC: Recent Labs  Lab 09/14/22 0338 09/14/22 1413 09/16/22 0628 09/17/22 0617 09/17/22 1830 09/18/22 0712  WBC 18.4*  --  15.5* 14.6* 22.8* 19.5*  HGB 7.0* 7.4* 6.8* 7.6* 8.2* 8.0*  HCT 21.6* 22.7* 21.2* 23.4* 24.8* 24.4*  MCV 85.0  --  86.5 86.0 84.1 86.2  PLT 817*  --  771* 879* 974* 989*   Basic Metabolic Panel: Recent Labs  Lab 09/12/22 0117 09/13/22 0708 09/14/22 0338 09/16/22 0628 09/17/22 0617 09/18/22 0712  NA 135 133* 133* 134* 135 134*  K 4.1 4.8 3.8 4.3 4.1 4.2  CL 102 102 101 96* 101 97*  CO2 24 24 22 25 25 22   GLUCOSE 145* 105* 116* 132* 107* 126*  BUN 26* 19 13 12 10 14   CREATININE 1.86* 1.49* 1.17 0.86 0.77 0.96  CALCIUM 8.3* 8.6* 8.4* 8.5* 8.6* 8.8*  MG 2.0  --   --   --   --   --    GFR: Estimated Creatinine Clearance: 81.1 mL/min (by C-G formula based on SCr of 0.96 mg/dL). Liver Function Tests: Recent Labs  Lab 09/12/22 0117 09/13/22 0708 09/16/22 0628 09/17/22 0617 09/18/22 0712  AST 40 36 33 61* 76*  ALT 30 29 27  40 57*  ALKPHOS 212* 158* 147* 156* 136*  BILITOT 0.5 0.4 0.3 0.6 0.6  PROT 7.8 7.5 8.0 8.2* 8.2*  ALBUMIN 2.5* 2.3* 2.2* 2.3* 2.5*   Recent Labs  Lab 09/16/22 0628 09/17/22 1830 09/18/22 0712  LIPASE 49 62* 53*   No results for input(s): "AMMONIA" in the last 168 hours. Coagulation Profile: Recent Labs  Lab 09/17/22 0617 09/18/22 0712  INR 1.2 1.2   Cardiac Enzymes: Recent  Labs  Lab 09/16/22 0628  CKTOTAL 27*   BNP (last 3 results) No results for input(s): "PROBNP" in the last 8760 hours. HbA1C: No results for input(s): "HGBA1C" in the last 72 hours. CBG: Recent Labs  Lab  09/12/22 0420 09/12/22 2020 09/12/22 2327 09/13/22 0407 09/14/22 0034  GLUCAP 135* 93 101* 96 111*   Lipid Profile: No results for input(s): "CHOL", "HDL", "LDLCALC", "TRIG", "CHOLHDL", "LDLDIRECT" in the last 72 hours. Thyroid Function Tests: No results for input(s): "TSH", "T4TOTAL", "FREET4", "T3FREE", "THYROIDAB" in the last 72 hours. Anemia Panel: No results for input(s): "VITAMINB12", "FOLATE", "FERRITIN", "TIBC", "IRON", "RETICCTPCT" in the last 72 hours. Urine analysis:    Component Value Date/Time   COLORURINE YELLOW 09/08/2022 1652   APPEARANCEUR CLOUDY (A) 09/08/2022 1652   LABSPEC 1.016 09/08/2022 1652   PHURINE 5.0 09/08/2022 1652   GLUCOSEU NEGATIVE 09/08/2022 1652   HGBUR NEGATIVE 09/08/2022 1652   BILIRUBINUR NEGATIVE 09/08/2022 1652   KETONESUR NEGATIVE 09/08/2022 1652   PROTEINUR 100 (A) 09/08/2022 1652   NITRITE NEGATIVE 09/08/2022 1652   LEUKOCYTESUR LARGE (A) 09/08/2022 1652    Radiological Exams on Admission: DG Chest Port 1 View  Result Date: 09/18/2022 CLINICAL DATA:  Sepsis EXAM: PORTABLE CHEST 1 VIEW COMPARISON:  X-ray 09/05/2022 and older FINDINGS: Normal cardiopericardial silhouette. Calcified aorta. Left lung is clear. No left-sided consolidation, pneumothorax or effusion. Overlapping cardiac leads. There is a small to moderate right pleural effusion with some adjacent opacity. No edema. There is no clear pneumothorax identified although there is horizontal appearance of the margin of the pleural effusion. Hydropneumothorax is not excluded with this appearance. IMPRESSION: Developing small to moderate right pleural effusion with some adjacent opacity. In addition there is a horizontal orientation to the pleural effusion. Although no pneumothorax  is seen, a hydropneumothorax is not excluded with this appearance. Recommend follow up chest CT to further delineate when appropriate. Electronically Signed   By: Karen Kays M.D.   On: 09/18/2022 17:23     Assessment/Plan Principal Problem:   SBP (spontaneous bacterial peritonitis) (HCC) Active Problems:   Chronic alcohol abuse   Chronic anemia   Chronic pain syndrome   Pancreatic insufficiency   Pseudocyst of pancreas due to acute pancreatitis   Alcohol-induced chronic pancreatitis (HCC)   Acute on chronic pancreatitis (HCC)   Enteritis due to Clostridium difficile    Decompensated liver cirrhosis with ascites Sepsis secondary to VRE positive SBP,pancreatic duct disruption -Pancreaticogastrostomy stent placed 09/17/2022 at [facility] with Dr. Corliss Parish.  Plan to repeat ERCP at their facility in 2 weeks to evaluate stent and likely replaced with larger diameter stent. -Sepsis evidenced by tachycardia and leukocytosis - source: klebsiella ascites vs cdiff (both presumed POA). -Paracentesis 7/1 - 3L; 7/5 - 3L; 7/8 2L, 7/12 1.7 L. 7/21 600cc -Multiple Cortrak's removed by patient -dislodged again intentionally 7/23 at [facility]; will keep NG out; advance diet as tolerated -Cultures(blood and paracentesis) initially negative however cultures on paracentesis from 7/12 shows vancomycin resistant enterococcus -plan to continue daptomycin -Urine culture shows multiple species, likely contaminant -Might need serial paracentesis in coming days.  Last paracentesis was on 7/21.  Abdomen not significantly distended today to require paracentesis emergently   Improving C. difficile colitis:  Stool positive for C. Difficile PO vancomycin will need to continue after completion of dapto per ID.   Anemia of chronic disease:  S/p one unit of blood transfusion on last hospitalization. Hb stable in the range of 8 this mrng    Thrombocytosis -Questionably reactive in the setting of above infection/anemia    Hypoalbuminemia Hypovolemic hyponatremia -Secondary to above, improving with p.o. intake as NG tube had been removed on last admission and  we will not replace it -Advance diet as tolerated, cortrack  pulled again in outside facility   Recurrent alcohol induced pancreatitis. -Has recurrent abdominal pain, nausea and vomiting -Secondary to recurent pancreatitis.chronic alcohol use/abuse p.o. intake as tolerated, pain control avoiding IV narcotics, antiemetics, PPI. Mildly elevated lipase at 53 this morning, mildly elevated liver enzymes   Severe protein caloric malnutrition High risk for refeeding syndrome -Complicated by chronic alcohol use -Cortrack placed 7/5 -dysfunctional 7/6 -replaced 09/02/2022, noted to be distal to the proximal duodenum.   - Multiple cortrack tubes accidentally versus intentionally dislodged and removed  -Continue to follow electrolytes, mag/Phos. Repleted as appropriate -No plan for reinserting tube for feeding on last admission.  Will continue full liquid diet for now.  Gradually advance as tolerated   Alcohol abuse - Alcohol withdrawal protocol completed, currently outside the window for any further withdrawal symptoms.      Essential hypertension -Currently well-controlled off medications, continue to monitor  GERD/duodenitis -Continue PPI   Acute kidney injury with hyperkalemia: Followed by nephrology.  Treated with Lokelma.  AKI has resolved.  Potassium level stable.   Noncompliance -Multiple episodes of abnormal behavior previously, multiple episodes of Cortrak removal, nursing staff noted patient eating cigarettes last week.   -Counseled to be compliant -We will check UDS.  Patient signed AMA and reappeared in the emergency department.  Addendum: CT abd/pelvis showed multiple pockets of pneumoperitoneum,subhepatic collection,moderate ascites. Though the pneumoperitoneum  cud be from paracentesis,will reach out to general surgery,awaiting call back.  We will also consult IR for drainage of subhepatic collection. We will broaden antibiotic coverage: adding flagyl and cefepime.Will keep NPO.   Severity of Illness: The appropriate patient status for this patient is INPATIENT. Inpatient status is judged to be reasonable and necessary in order to provide the required intensity of service to ensure the patient's safety. The patient's presenting symptoms, physical exam findings, and initial radiographic and laboratory data in the context of their chronic comorbidities is felt to place them at high risk for further clinical deterioration. Furthermore, it is not anticipated that the patient will be medically stable for discharge from the hospital within 2 midnights of admission.   * I certify that at the point of admission it is my clinical judgment that the patient will require inpatient hospital care spanning beyond 2 midnights from the point of admission due to high intensity of service, high risk for further deterioration and high frequency of surveillance required.*   DVT prophylaxis: Lovenox Code Status: Full Family Communication:  Consults called: Patient was being followed by GI,ID.     Burnadette Pop MD [facility]  09/18/2022, 5:30 PM

## 2022-09-18 NOTE — Progress Notes (Signed)
Pharmacy Antibiotic Note  Samuel Blevins is a 57 y.o. male admitted on 08/26/2022 with decompensated liver cirrhosis, ascites and VRE peritonitis. Pharmacy was consulted for daptomycin dosing. Daptomycin was being dosed at 700mg  q24. Patient left AMA from that encounter earlier today and has returned. Last daptomycin dose 7/23.  Also with C diff on oral vancomycin. ID recommending PO vancomycin therapy until daptomycin therapy completed.  SCr 1.18  Plan: -Continue Daptomycin 700 mg IV q24h (10 mg/kg) -Monitor renal function, clinical progress, cultures/sensitivities -F/U LOT per ID, CK on Monday -Vancomycin po duration will need to be extended based on daptomycin plans  Height: 5\' 9"  (175.3 cm) Weight: 68.4 kg (150 lb 12.7 oz) IBW/kg (Calculated) : 70.7  Temp (24hrs), Avg:99.2 F (37.3 C), Min:98.2 F (36.8 C), Max:100.7 F (38.2 C)  Recent Labs  Lab 09/14/22 0338 09/16/22 0628 09/17/22 0617 09/17/22 1830 09/18/22 0712 09/18/22 1637 09/18/22 1744  WBC 18.4* 15.5* 14.6* 22.8* 19.5* 22.5*  --   CREATININE 1.17 0.86 0.77  --  0.96 1.18  --   LATICACIDVEN  --   --   --   --   --   --  1.7    Estimated Creatinine Clearance: 66 mL/min (by C-G formula based on SCr of 1.18 mg/dL).    No Known Allergies  7/1- Bcx- negF 7/1- peritoneal fluid cx- negF 7/12 C diff + 7/12 Peritoneal washing - VRE   Cefepime 7/1 >> 7/8 Zosyn 7/12 >> 7/14 Vanc PO (C diff +) 7/12 >> (Duration pending daptomycin completion per ID recommendations) Dapto 7/14 >>    7/15 CK: 69 7/22 CK= 27  Thank you for allowing pharmacy to be a part of this patient's care.  Delmar Landau, PharmD, BCPS 09/18/2022 6:03 PM ED Clinical Pharmacist -  [phone]

## 2022-09-18 NOTE — ED Provider Notes (Signed)
[place] EMERGENCY DEPARTMENT AT [facility] Provider Note   CSN: [id number] Arrival date & time: 09/18/22  1500     History  Chief Complaint  Patient presents with   Pelvic Pain    Samuel Blevins is a 57 y.o. male.   Pelvic Pain   Patient is a 57 year old male with a past medical history significant for recurrent pancreatitis and pancreatic insufficiency and recurrent alcohol use  He presents emergency room today and informs me that he left AMA and had several drinks of alcohol after leaving AMA.  Seems that he has ongoing abdominal pain.  Was found to have a fever in triage.  He denies any vomiting.  States that he feels fatigued and achy and tired and has severe abdominal pain.  No other associated symptoms.     Home Medications Prior to Admission medications   Medication Sig Start Date End Date Taking? Authorizing Provider  amLODipine (NORVASC) 5 MG tablet Take 5 mg by mouth daily.    [provider]  carvedilol (COREG) 3.125 MG tablet Take 1 tablet (3.125 mg total) by mouth 2 (two) times daily with a meal. 07/10/22   [name], MD  folic acid (FOLVITE) 1 MG tablet Take 1 tablet (1 mg total) by mouth daily. 07/14/22   Rolly Salter, MD  hydrOXYzine (ATARAX) 25 MG tablet Take 25 mg by mouth at bedtime as needed for anxiety.    [provider]  Iron, Ferrous Sulfate, 325 (65 Fe) MG TABS Take 1 tablet ( 325 mg) by mouth daily. 05/24/22   [name], MD  lipase/protease/amylase (CREON) 12000-38000 units CPEP capsule Take 1 capsule (12,000 Units total) by mouth 3 (three) times daily before meals. 07/14/22   Rolly Salter, MD  magnesium oxide (MAG-OX) 400 MG tablet Take 1 tablet (400 mg total) by mouth in the morning and at bedtime. 07/14/22   Rolly Salter, MD  Multiple Vitamin (MULTIVITAMIN WITH MINERALS) TABS tablet Take 1 tablet by mouth daily. 07/14/22   Rolly Salter, MD  nicotine (NICODERM CQ - DOSED IN MG/24 HOURS) 14  mg/24hr patch Place 1 patch (14 mg total) onto the skin daily. Patient not taking: Reported on 08/21/2022 07/14/22   Rolly Salter, MD  ondansetron (ZOFRAN) 8 MG tablet Take 8 mg by mouth every 6 (six) hours as needed for nausea or vomiting.    [provider]  pantoprazole (PROTONIX) 40 MG tablet Take 1 tablet (40 mg total) by mouth 2 (two) times daily before a meal. 07/14/22   Rolly Salter, MD  polyethylene glycol (MIRALAX / GLYCOLAX) 17 g packet Take 17 g by mouth daily as needed for mild constipation or moderate constipation. 07/14/22   Rolly Salter, MD  sucralfate (CARAFATE) 1 g tablet Take 1 g by mouth See admin instructions. Take 1 tablet by mouth 4 times daily with meals and bedtime    [provider]  thiamine (VITAMIN B-1) 100 MG tablet Take 1 tablet (100 mg total) by mouth daily. 07/14/22   Rolly Salter, MD  traMADol (ULTRAM) 50 MG tablet Take 1 tablet (50 mg total) by mouth every 8 (eight) hours as needed for severe pain. 07/17/22   [name], MD      Allergies    Patient has no known allergies.    Review of Systems   Review of Systems  Genitourinary:  Positive for pelvic pain.    Physical Exam Updated Vital Signs  BP 122/69 (BP Location: Right Arm)   Pulse (!) 127   Temp (!) 100.7 F (38.2 C) (Oral)   Resp (!) 30   Ht 5\' 9"  (1.753 m)   Wt 68.4 kg   SpO2 95%   BMI 22.27 kg/m  Physical Exam Vitals and nursing note reviewed.  Constitutional:      Appearance: He is ill-appearing. He is not diaphoretic.  HENT:     Head: Normocephalic and atraumatic.     Nose: Nose normal.     Mouth/Throat:     Mouth: Mucous membranes are dry.  Eyes:     General: No scleral icterus. Cardiovascular:     Rate and Rhythm: Regular rhythm. Tachycardia present.     Pulses: Normal pulses.     Heart sounds: Normal heart sounds.  Pulmonary:     Effort: Pulmonary effort is normal. No respiratory distress.     Breath sounds: No wheezing.  Abdominal:      Palpations: Abdomen is soft.     Tenderness: There is abdominal tenderness.     Comments: Guarding diffusely with diffuse abdominal tenderness  Musculoskeletal:     Cervical back: Normal range of motion.     Right lower leg: No edema.     Left lower leg: No edema.  Skin:    General: Skin is warm.     Capillary Refill: Capillary refill takes less than 2 seconds.  Neurological:     Mental Status: He is alert. Mental status is at baseline.  Psychiatric:        Mood and Affect: Mood normal.        Behavior: Behavior normal.     ED Results / Procedures / Treatments   Labs (all labs ordered are listed, but only abnormal results are displayed) Labs Reviewed  RESP PANEL BY RT-PCR (RSV, FLU A&B, COVID)  [id number]  CULTURE, BLOOD (ROUTINE X 2)  CULTURE, BLOOD (ROUTINE X 2)  COMPREHENSIVE METABOLIC PANEL  CBC WITH DIFFERENTIAL/PLATELET  PROTIME-INR  APTT  URINALYSIS, W/ REFLEX TO CULTURE (INFECTION SUSPECTED)  I-STAT CG4 LACTIC ACID, ED    EKG None  Radiology No results found.  Procedures .Critical Care  Performed by: Gailen Shelter, PA Authorized by: Gailen Shelter, PA   Critical care provider statement:    Critical care time (minutes):  35   Critical care time was exclusive of:  Separately billable procedures and treating other patients and teaching time   Critical care was necessary to treat or prevent imminent or life-threatening deterioration of the following conditions:  Sepsis   Critical care was time spent personally by me on the following activities:  Development of treatment plan with patient or surrogate, review of old charts, re-evaluation of patient's condition, pulse oximetry, ordering and review of radiographic studies, ordering and review of laboratory studies, ordering and performing treatments and interventions, obtaining history from patient or surrogate, examination of patient and evaluation of patient's response to treatment   Care discussed with: admitting  provider       Medications Ordered in ED Medications  lactated ringers infusion (has no administration in time range)  lactated ringers bolus 1,000 mL (has no administration in time range)    ED Course/ Medical Decision Making/ A&P Clinical Course as of 09/18/22 1913  Wed Sep 18, 2022  1715 Admitted to triad [WF]    Clinical Course User Index [WF] Gailen Shelter, [place]  Medical Decision Making Amount and/or Complexity of Data Reviewed Labs: ordered. Radiology: ordered. ECG/medicine tests: ordered.  Risk Prescription drug management. Decision regarding hospitalization.   This patient presents to the ED for concern of abd pain, this involves a number of treatment options, and is a complaint that carries with it a moderate to high risk of complications and morbidity. A differential diagnosis was considered for the patient's symptoms which is discussed below:   The causes of generalized abdominal pain include but are not limited to AAA, mesenteric ischemia, appendicitis, diverticulitis, DKA, gastritis, gastroenteritis, AMI, nephrolithiasis, pancreatitis, peritonitis, adrenal insufficiency,lead poisoning, iron toxicity, intestinal ischemia, constipation, UTI,SBO/LBO, splenic rupture, biliary disease, IBD, IBS, PUD, or hepatitis.   Co morbidities: Discussed in HPI   Brief History:  Patient is a 57 year old male with a past medical history significant for recurrent pancreatitis and pancreatic insufficiency and recurrent alcohol use  He presents emergency room today and informs me that he left AMA and had several drinks of alcohol after leaving AMA.  Seems that he has ongoing abdominal pain.  Was found to have a fever in triage.  He denies any vomiting.  States that he feels fatigued and achy and tired and has severe abdominal pain.  No other associated symptoms.    EMR reviewed including pt PMHx, past surgical history and past visits to ER.   See  HPI for more details   Lab Tests:  Leukocytosis of 22,000, left shift, febrile and tachycardic with tachypnea code sepsis initiated.  CMP with elevated liver enzymes creatinine is normal.  Blood cultures obtained prior to empiric antibiotics   Imaging Studies:  Abnormal findings. I personally reviewed all imaging studies. Imaging notable for  IMPRESSION:  There is no evidence of intestinal obstruction. There is no  hydronephrosis.    There are multiple pockets of pneumoperitoneum in upper and mid  abdomen. This may be related to recent paracentesis or suggest bowel  perforation or infectious peritonitis.    There is interval decrease in amount of ascites. Still, there is  moderate residual ascites in abdomen and pelvis. There is a  loculated thick-walled fluid collection inferior to the right lobe  of liver measuring 11.2 cm in maximum diameter. There is another  loculated fluid collection measuring 9.8 cm in maximum diameter with  slightly thickened wall in left mid to lower abdomen. Possibility of  infectious process in the peritoneal cavity is not excluded. Please  correlate with laboratory findings and consider sampling of  loculated fluid in right subhepatic location.    There is interval worsening of infiltrates in the lower lung fields  suggesting atelectasis/pneumonia. There is interval increase in  right pleural effusion.    Catheter is noted extending from the stomach through the pancreas to  the duodenum.    Cardiac Monitoring:  The patient was maintained on a cardiac monitor.  I personally viewed and interpreted the cardiac monitored which showed an underlying rhythm of: Sinus tachycardia EKG non-ischemic   Medicines ordered:  I ordered medication including lactated Ringer's, daptomycin for infection, hydration Reevaluation of the patient after these medicines showed that the patient stayed the same I have reviewed the patients home medicines and have made  adjustments as needed   Critical Interventions:   admission for IV antibiotics   Consults/Attending Physician   I discussed this case with my attending physician who cosigned this note including patient's presenting symptoms, physical exam, and planned diagnostics and interventions. Attending physician stated agreement with plan or made changes to  plan which were implemented.  Discussed with [facility] Dr. Tomasa Rand will be involved  Discussed with Dr.Adhikari who will admit.  Reevaluation:  After the interventions noted above I re-evaluated patient and found that they have :improved   Social Determinants of Health:  The patient's social determinants of health were a factor in the care of this patient    Problem List / ED Course:  Sepsis likely due to intra-abdominal abscesses/fluid collection.  Discussed with [facility] Dr. Tomasa Rand will be involved in the case I discussed with APP.  Hospitalist to admit.  Started on daptomycin and IV hydrated.   Dispostion:  After consideration of the diagnostic results and the patients response to treatment, I feel that the patent would benefit from admission.   Final Clinical Impression(s) / ED Diagnoses Final diagnoses:  Sepsis without acute organ dysfunction, due to unspecified organism [facility])    Rx / DC Orders ED Discharge Orders     None         Gailen Shelter, [place] 09/18/22 Dorothy Spark, MD 09/20/22 1656

## 2022-09-18 NOTE — ED Triage Notes (Signed)
The pt reports that he was at this hospital yesterday  ????

## 2022-09-18 NOTE — Progress Notes (Signed)
Pharmacy Antibiotic Note  Samuel Blevins is a 57 y.o. male admitted on 09/18/2022 with sepsis.  Pharmacy has been consulted for cefepime dosing. Pt remains on daptomycin for VRE in ascitis fluid. Pt left AMA this morning.  Plan: Cefepime 2g IV q8h  Height: 5\' 9"  (175.3 cm) Weight: 68.4 kg (150 lb 12.7 oz) IBW/kg (Calculated) : 70.7  Temp (24hrs), Avg:99.2 F (37.3 C), Min:98.1 F (36.7 C), Max:100.7 F (38.2 C)  Recent Labs  Lab 09/14/22 0338 09/16/22 0628 09/17/22 0617 09/17/22 1830 09/18/22 0712 09/18/22 1637 09/18/22 1744  WBC 18.4* 15.5* 14.6* 22.8* 19.5* 22.5*  --   CREATININE 1.17 0.86 0.77  --  0.96 1.18  --   LATICACIDVEN  --   --   --   --   --   --  1.7    Estimated Creatinine Clearance: 66 mL/min (by C-G formula based on SCr of 1.18 mg/dL).    No Known Allergies  7/1- Bcx- negF 7/1- peritoneal fluid cx- negF 7/12 C diff + 7/12 Peritoneal washing - VRE   Cefepime 7/1 >> 7/8; 7/24 >> Zosyn 7/12 >> 7/14 Vanc PO (C diff +) 7/12 >> (7/26, potentially longer depending of DOT of dapto) Dapto 7/14 >>    7/15 CK: 69 7/22 CK= 27  Fredonia Highland, PharmD, [place], [facility] Clinical Pharmacist [phone] Please check AMION for all [facility] Pharmacy numbers 09/18/2022

## 2022-09-18 NOTE — Progress Notes (Signed)
Pt requested to leave AMA, MD [facility] notified and came to beside to educate him, pt still requested to leave, AMA papers signed, Midline IV removed.    Balinda Quails, RN 09/18/2022 10:50 AM

## 2022-09-18 NOTE — ED Notes (Signed)
ED TO INPATIENT HANDOFF REPORT  ED Nurse Name and Phone #: Delorse Lek [phone]  S Name/Age/Gender Samuel Blevins 57 y.o. male Room/Bed: 021C/021C  Code Status   Code Status: Full Code  Home/SNF/Other Home Patient oriented to: self, place, time, and situation Is this baseline? Yes   Triage Complete: Triage complete  Chief Complaint SBP (spontaneous bacterial peritonitis) (HCC) [K65.2]  Triage Note I'm sl confused  this man reports that he was in [place] yesterday to have a surgery on his lt pelvis??? I think he signed out ama because he had bills to pay and could not stay  I cannot figure out what type surgery he was supposed to have  The pt reports that he was at this hospital yesterday  ????   Allergies No Known Allergies  Level of Care/Admitting Diagnosis ED Disposition     ED Disposition  Admit   Condition  --   Comment  Hospital Area: [facility] [[id number]]  Level of Care: Med-Surg [16]  May admit patient to [facility] or [facility] if equivalent level of care is available:: No  Covid Evaluation: Confirmed COVID Negative  Diagnosis: SBP (spontaneous bacterial peritonitis) [facility]) [[id number]]  Admitting Physician: Burnadette Pop [[id number]]  Attending Physician: Burnadette Pop [[id number]]  Certification:: I certify this patient will need inpatient services for at least 2 midnights  Estimated Length of Stay: 2          B Medical/Surgery History Past Medical History:  Diagnosis Date   Alcohol withdrawal syndrome with complication (HCC)    Alcoholism (HCC)    Elevated AST (SGOT)    Gastrointestinal hemorrhage    Homeless    Hypertension    Pancreatic insufficiency    takes Creon   Symptomatic anemia 11/23/2015   Thrombocytopenia (HCC) 05/09/2021   Past Surgical History:  Procedure Laterality Date   BIOPSY  06/23/2019   Procedure: BIOPSY;  Surgeon: Shellia Cleverly, DO;  Location: [facility] ENDOSCOPY;  Service: Gastroenterology;;    BIOPSY  12/12/2020   Procedure: BIOPSY;  Surgeon: Lynann Bologna, MD;  Location: [facility] ENDOSCOPY;  Service: Endoscopy;;   BIOPSY  08/04/2021   Procedure: BIOPSY;  Surgeon: Jenel Lucks, MD;  Location: [facility] ENDOSCOPY;  Service: Gastroenterology;;   ESOPHAGOGASTRODUODENOSCOPY N/A 08/04/2021   Procedure: ESOPHAGOGASTRODUODENOSCOPY (EGD);  Surgeon: Jenel Lucks, MD;  Location: [facility] ENDOSCOPY;  Service: Gastroenterology;  Laterality: N/A;   ESOPHAGOGASTRODUODENOSCOPY (EGD) WITH PROPOFOL N/A 06/23/2019   Procedure: ESOPHAGOGASTRODUODENOSCOPY (EGD) WITH PROPOFOL;  Surgeon: Shellia Cleverly, DO;  Location: [facility] ENDOSCOPY;  Service: Gastroenterology;  Laterality: N/A;   ESOPHAGOGASTRODUODENOSCOPY (EGD) WITH PROPOFOL N/A 12/12/2020   Procedure: ESOPHAGOGASTRODUODENOSCOPY (EGD) WITH PROPOFOL;  Surgeon: Lynann Bologna, MD;  Location: [facility] ENDOSCOPY;  Service: Endoscopy;  Laterality: N/A;   HOT HEMOSTASIS N/A 06/23/2019   Procedure: HOT HEMOSTASIS (ARGON PLASMA COAGULATION/BICAP);  Surgeon: Shellia Cleverly, DO;  Location: [facility] ENDOSCOPY;  Service: Gastroenterology;  Laterality: N/A;   IR PARACENTESIS  08/21/2022   IR PARACENTESIS  08/26/2022   IR PARACENTESIS  08/30/2022   IR PARACENTESIS  09/02/2022   IR PARACENTESIS  09/06/2022     A IV Location/Drains/Wounds Patient Lines/Drains/Airways Status     Active Line/Drains/Airways     Name Placement date Placement time Site Days   Peripheral IV 09/18/22 20 G Posterior;Left Hand 09/18/22  1658  Hand  less than 1   Midline Single Lumen 09/07/22 Left Brachial 8 cm 0 cm 09/07/22  0354  Brachial  11  Intake/Output Last 24 hours No intake or output data in the 24 hours ending 09/18/22 1757  Labs/Imaging Results for orders placed or performed during the hospital encounter of 09/18/22 (from the past 48 hour(s))  CBC with Differential     Status: Abnormal   Collection Time: 09/18/22  4:37 PM  Result Value Ref Range   WBC 22.5 (H) 4.0 - 10.5  K/uL   RBC 2.65 (L) 4.22 - 5.81 MIL/uL   Hemoglobin 7.4 (L) 13.0 - 17.0 g/dL   HCT 21.3 (L) 08.6 - 57.8 %   MCV 84.9 80.0 - 100.0 fL   MCH 27.9 26.0 - 34.0 pg   MCHC 32.9 30.0 - 36.0 g/dL   RDW 46.9 (H) 62.9 - 52.8 %   Platelets 997 (HH) 150 - 400 K/uL    Comment: CRITICAL VALUE NOTED.  VALUE IS CONSISTENT WITH PREVIOUSLY REPORTED AND CALLED VALUE. REPEATED TO VERIFY    nRBC 0.0 0.0 - 0.2 %   Neutrophils Relative % 85 %   Neutro Abs 19.1 (H) 1.7 - 7.7 K/uL   Lymphocytes Relative 6 %   Lymphs Abs 1.4 0.7 - 4.0 K/uL   Monocytes Relative 7 %   Monocytes Absolute 1.5 (H) 0.1 - 1.0 K/uL   Eosinophils Relative 0 %   Eosinophils Absolute 0.0 0.0 - 0.5 K/uL   Basophils Relative 0 %   Basophils Absolute 0.1 0.0 - 0.1 K/uL   Immature Granulocytes 2 %   Abs Immature Granulocytes 0.39 (H) 0.00 - 0.07 K/uL    Comment: Performed at [facility], [address] [place], [place] [address]  I-Stat Lactic Acid, ED     Status: None   Collection Time: 09/18/22  5:44 PM  Result Value Ref Range   Lactic Acid, Venous 1.7 0.5 - 1.9 mmol/L   DG Chest Port 1 View  Result Date: 09/18/2022 CLINICAL DATA:  Sepsis EXAM: PORTABLE CHEST 1 VIEW COMPARISON:  X-ray 09/05/2022 and older FINDINGS: Normal cardiopericardial silhouette. Calcified aorta. Left lung is clear. No left-sided consolidation, pneumothorax or effusion. Overlapping cardiac leads. There is a small to moderate right pleural effusion with some adjacent opacity. No edema. There is no clear pneumothorax identified although there is horizontal appearance of the margin of the pleural effusion. Hydropneumothorax is not excluded with this appearance. IMPRESSION: Developing small to moderate right pleural effusion with some adjacent opacity. In addition there is a horizontal orientation to the pleural effusion. Although no pneumothorax is seen, a hydropneumothorax is not excluded with this appearance. Recommend follow up chest CT to further delineate  when appropriate. Electronically Signed   By: Karen Kays M.D.   On: 09/18/2022 17:23    Pending Labs Unresulted Labs (From admission, onward)     Start     Ordered   09/19/22 0500  Comprehensive metabolic panel  Tomorrow morning,   R        09/18/22 1738   09/19/22 0500  CBC  Tomorrow morning,   R        09/18/22 1738   09/19/22 0500  Protime-INR  Tomorrow morning,   R        09/18/22 1738   09/18/22 1741  Rapid urine drug screen (hospital performed)  ONCE - STAT,   STAT        09/18/22 1740   09/18/22 1554  Resp panel by RT-PCR (RSV, Flu A&B, Covid) Anterior Nasal Swab  (Septic presentation on arrival (screening labs, nursing and treatment orders for obvious sepsis))  Once,   URGENT        09/18/22 1555   09/18/22 1554  Comprehensive metabolic panel  (Septic presentation on arrival (screening labs, nursing and treatment orders for obvious sepsis))  ONCE - STAT,   STAT        09/18/22 1555   09/18/22 1554  Protime-INR  (Septic presentation on arrival (screening labs, nursing and treatment orders for obvious sepsis))  ONCE - STAT,   STAT        09/18/22 1555   09/18/22 1554  APTT  (Septic presentation on arrival (screening labs, nursing and treatment orders for obvious sepsis))  ONCE - STAT,   STAT        09/18/22 1555   09/18/22 1554  Blood Culture (routine x 2)  (Septic presentation on arrival (screening labs, nursing and treatment orders for obvious sepsis))  BLOOD CULTURE X 2,   STAT      09/18/22 1555   09/18/22 1554  Urinalysis, w/ Reflex to Culture (Infection Suspected) -Urine, Clean Catch  (Septic presentation on arrival (screening labs, nursing and treatment orders for obvious sepsis))  ONCE - URGENT,   URGENT       Question:  Specimen Source  Answer:  Urine, Clean Catch   09/18/22 1555            Vitals/Pain Today's Vitals   09/18/22 1522 09/18/22 1609 09/18/22 1647 09/18/22 1652  BP: 122/69   126/83  Pulse: (!) 127   (!) 122  Resp: (!) 30   (!) 22  Temp: 100.1 F  (37.8 C) (!) 100.7 F (38.2 C)  98.7 F (37.1 C)  TempSrc: Oral Oral  Oral  SpO2: 95%   100%  Weight:      Height:      PainSc:   9      Isolation Precautions No active isolations  Medications Medications  lactated ringers infusion ( Intravenous New Bag/Given 09/18/22 1705)  traMADol (ULTRAM) tablet 50 mg (has no administration in time range)  nicotine (NICODERM CQ - dosed in mg/24 hours) patch 14 mg (has no administration in time range)  lipase/protease/amylase (CREON) capsule 12,000 Units (has no administration in time range)  magnesium oxide (MAG-OX) tablet 400 mg (has no administration in time range)  pantoprazole (PROTONIX) EC tablet 40 mg (has no administration in time range)  polyethylene glycol (MIRALAX / GLYCOLAX) packet 17 g (has no administration in time range)  sucralfate (CARAFATE) tablet 1 g (has no administration in time range)  folic acid (FOLVITE) tablet 1 mg (has no administration in time range)  Iron (Ferrous Sulfate) TABS 325 mg (has no administration in time range)  thiamine (VITAMIN B1) tablet 100 mg (has no administration in time range)  multivitamin with minerals tablet 1 tablet (has no administration in time range)  enoxaparin (LOVENOX) injection 40 mg (has no administration in time range)  ondansetron (ZOFRAN) tablet 4 mg (has no administration in time range)    Or  ondansetron (ZOFRAN) injection 4 mg (has no administration in time range)  vancomycin (VANCOCIN) 50 mg/mL oral solution SOLN 125 mg (has no administration in time range)  lactated ringers bolus 1,000 mL (1,000 mLs Intravenous New Bag/Given 09/18/22 1700)    Mobility walks with device     Focused Assessments    R Recommendations: See Admitting Provider Note  Report given to:   Additional Notes:

## 2022-09-19 ENCOUNTER — Encounter (HOSPITAL_COMMUNITY): Payer: Self-pay | Admitting: Internal Medicine

## 2022-09-19 ENCOUNTER — Inpatient Hospital Stay (HOSPITAL_COMMUNITY): Payer: MEDICAID

## 2022-09-19 DIAGNOSIS — K860 Alcohol-induced chronic pancreatitis: Secondary | ICD-10-CM

## 2022-09-19 DIAGNOSIS — K861 Other chronic pancreatitis: Secondary | ICD-10-CM

## 2022-09-19 DIAGNOSIS — K8689 Other specified diseases of pancreas: Secondary | ICD-10-CM

## 2022-09-19 DIAGNOSIS — K652 Spontaneous bacterial peritonitis: Secondary | ICD-10-CM | POA: Diagnosis not present

## 2022-09-19 DIAGNOSIS — R188 Other ascites: Secondary | ICD-10-CM

## 2022-09-19 DIAGNOSIS — A0472 Enterocolitis due to Clostridium difficile, not specified as recurrent: Secondary | ICD-10-CM | POA: Diagnosis not present

## 2022-09-19 DIAGNOSIS — K651 Peritoneal abscess: Secondary | ICD-10-CM

## 2022-09-19 LAB — COMPREHENSIVE METABOLIC PANEL
ALT: 35 U/L (ref 0–44)
AST: 35 U/L (ref 15–41)
Albumin: 2.2 g/dL — ABNORMAL LOW (ref 3.5–5.0)
Alkaline Phosphatase: 127 U/L — ABNORMAL HIGH (ref 38–126)
Anion gap: 10 (ref 5–15)
BUN: 11 mg/dL (ref 6–20)
CO2: 22 mmol/L (ref 22–32)
Calcium: 8.6 mg/dL — ABNORMAL LOW (ref 8.9–10.3)
Chloride: 99 mmol/L (ref 98–111)
Creatinine, Ser: 0.85 mg/dL (ref 0.61–1.24)
Glucose, Bld: 101 mg/dL — ABNORMAL HIGH (ref 70–99)
Potassium: 3.7 mmol/L (ref 3.5–5.1)
Sodium: 131 mmol/L — ABNORMAL LOW (ref 135–145)
Total Bilirubin: 0.9 mg/dL (ref 0.3–1.2)
Total Protein: 7.5 g/dL (ref 6.5–8.1)

## 2022-09-19 LAB — CBC
HCT: 22.2 % — ABNORMAL LOW (ref 39.0–52.0)
Hemoglobin: 7.4 g/dL — ABNORMAL LOW (ref 13.0–17.0)
MCH: 28.5 pg (ref 26.0–34.0)
MCV: 85.4 fL (ref 80.0–100.0)
Platelets: 871 10*3/uL — ABNORMAL HIGH (ref 150–400)
RBC: 2.6 MIL/uL — ABNORMAL LOW (ref 4.22–5.81)
RDW: 21.1 % — ABNORMAL HIGH (ref 11.5–15.5)
WBC: 25.5 10*3/uL — ABNORMAL HIGH (ref 4.0–10.5)

## 2022-09-19 LAB — PROTIME-INR: INR: 1.3 — ABNORMAL HIGH (ref 0.8–1.2)

## 2022-09-19 LAB — CULTURE, BLOOD (ROUTINE X 2)
Culture: NO GROWTH
Special Requests: ADEQUATE
Special Requests: ADEQUATE

## 2022-09-19 LAB — ETHANOL: Alcohol, Ethyl (B): <10 mg/dL (ref <10)

## 2022-09-19 MED ORDER — SODIUM CHLORIDE 0.9% FLUSH
5.0000 mL | Freq: Three times a day (TID) | INTRAVENOUS | Status: DC
  Administered 2022-09-19 – 2022-09-26 (×19): 5 mL

## 2022-09-19 MED ORDER — LIDOCAINE HCL (PF) 1 % IJ SOLN
20.0000 mL | Freq: Once | INTRAMUSCULAR | Status: AC
  Administered 2022-09-19: 20 mL
  Filled 2022-09-19: qty 20

## 2022-09-19 MED ORDER — MIDAZOLAM HCL 2 MG/2ML IJ SOLN
INTRAMUSCULAR | Status: AC
  Filled 2022-09-19: qty 2

## 2022-09-19 MED ORDER — FENTANYL CITRATE (PF) 100 MCG/2ML IJ SOLN
INTRAMUSCULAR | Status: AC
  Filled 2022-09-19: qty 2

## 2022-09-19 MED ORDER — FENTANYL CITRATE (PF) 100 MCG/2ML IJ SOLN
INTRAMUSCULAR | Status: AC | PRN
  Administered 2022-09-19 (×3): 25 ug via INTRAVENOUS

## 2022-09-19 MED ORDER — MIDAZOLAM HCL 2 MG/2ML IJ SOLN
INTRAMUSCULAR | Status: AC | PRN
  Administered 2022-09-19 (×2): .5 mg via INTRAVENOUS
  Administered 2022-09-19: 1 mg via INTRAVENOUS

## 2022-09-19 MED ORDER — HYDROCODONE-ACETAMINOPHEN 5-325 MG PO TABS
1.0000 | ORAL_TABLET | ORAL | Status: AC
  Administered 2022-09-19: 1 via ORAL
  Filled 2022-09-19: qty 1

## 2022-09-19 NOTE — Progress Notes (Signed)
Daily Progress Note  DOA: 09/18/2022 Hospital Day: 2 Chief Complaint: Chronic pancreatitis with PD disruption   Assessment and Plan:    Brief Narrative:  Samuel Blevins is a 57 y.o. year old male with a history of  chronic pancreatitis, etoh abuse, admitted with persistent / recurrent infected peritonitis. 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  However he could still have cirrhosis as imaging this admission coincidentally does show a"nodular" contour of fatty liver. More recent imaging ( MRI) shows only hepatomegaly and steatosis. He has had a prolonged admission.    Prolonged hospitalization for chronic pancreatitis, PD disruption and VRE infected pancreatic ascites . Transported for the day to [facility] on 7/23 and underwent EUS guided pancreaticogastrostomy for treatment of PD disruption.  A 15 cm long X 5 Fr  stent was placed into the pancreatic duct through the pancreaticogastrostomy. That evening he complained of left sided abdominal pain. Lipase was normal but WBC rose from 14.6 >> 22K. Patient left AMA the following day (09/18/22) but returned later that afternoon. On arrival he was found to have sepsis. CT abd/pelvis showed multiple pockets of focal fluid collections along the greater curvature of the stomach,  moderate ascites and pneumoperitoneum. --General Surgery is following. CT scan today negative for intestinal leak . TRH spoke to Dr. Corliss Parish GI at [facility] (the one who did the procedure)  and pneumo peritoneum is expected from his stance and he notes the size is actually reasonable, and he is okay to advance diet as tolerated --s/p abscess drain placement by IR earlier today.   --He got a couple of doses of  flagyl and Maxipime both both d/ced today. ID following, will continue Daptomycin  - No longer getting  [place] Octreotide - Continue Creon 12K units TID - He needs to return [facility]  in a week for upsizing of stent.    C-diff infection.  --ID has just evaluated. Course of Vancomycin is now complete.  --No significant diarrhea   Subjective / New Events:   No diarrhea today. Complains of generalized abdominal pain.    Objective:   Recent Labs    09/18/22 0712 09/18/22 1637 09/19/22 0808  WBC 19.5* 22.5* 25.5*  HGB 8.0* 7.4* 7.4*  HCT 24.4* 22.5* 22.2*  PLT 989* 997* 871*   BMET Recent Labs    09/18/22 0712 09/18/22 1637 09/19/22 0808  NA 134* 133* 131*  K 4.2 4.1 3.7  CL 97* 99 99  CO2 22 22 22   GLUCOSE 126* 118* 101*  BUN 14 16 11   CREATININE 0.96 1.18 0.85  CALCIUM 8.8* 8.7* 8.6*   LFT Recent Labs    09/19/22 0808  PROT 7.5  ALBUMIN 2.2*  AST 35  ALT 35  ALKPHOS 127*  BILITOT 0.9   PT/INR Recent Labs    09/18/22 1637 09/19/22 0808  LABPROT 15.5* 16.5*  INR 1.2 1.3*     Imaging:  CT ABDOMEN PELVIS WO CONTRAST CLINICAL DATA:  evaluate for perforated viscous  EXAM: CT ABDOMEN AND PELVIS WITHOUT CONTRAST  TECHNIQUE: Multidetector CT imaging of the abdomen and pelvis was performed following the standard protocol without IV contrast.  RADIATION DOSE REDUCTION: This exam was performed according to the departmental dose-optimization program which includes automated exposure control, adjustment of the mA and/or kV according to patient size and/or use of iterative reconstruction technique.  COMPARISON:  09/18/2022.  08/26/2022.  FINDINGS:  Lower chest: Moderate right pleural effusion. Right lower lobe and right middle lobe airspace disease could reflect atelectasis or infiltrate/pneumonia. Coronary artery and aortic calcifications.  Hepatobiliary: No focal hepatic abnormality. Gallbladder unremarkable.  Pancreas: Pancreatic ductal stent in place terminating in the stomach. No visible focal pancreatic abnormality. No surrounding inflammation.  Spleen: No focal abnormality.  Normal size.  Adrenals/Urinary Tract: No  adrenal abnormality. No focal renal abnormality. No stones or hydronephrosis. Urinary bladder is unremarkable.  Stomach/Bowel: Stomach, large and small bowel grossly unremarkable.  Vascular/Lymphatic: Aortic atherosclerosis. No evidence of aneurysm or adenopathy.  Reproductive: No visible focal abnormality.  Other: Moderate ascites in the abdomen and pelvis. Multiple focal fluid collections are noted along the greater curvature of the stomach, inferior to the right hepatic lobe, and in the left mid to lower abdomen. Pneumoperitoneum again noted, stable.  Musculoskeletal: No acute bony abnormality.  IMPRESSION: Continued pneumoperitoneum. No obvious source of the free air noted. No abnormal bowel. Therefore, this is favored to be related to recent paracentesis.  Moderate ascites. Multiple loculated fluid collections as described above. These could reflect loculated ascites, less likely abscess is. The sub a pathic fluid collection or the left lower anterior abdominal fluid collection would be amenable to sampling if felt clinically relevant.  Small to moderate right pleural effusion. Compressive atelectasis versus pneumonia in the right middle lobe and right lower lobe.  Aortoiliac atherosclerosis.  Electronically Signed   By: Charlett Nose M.D.   On: 09/19/2022 01:56     Scheduled inpatient medications:   enoxaparin (LOVENOX) injection  40 mg Subcutaneous Q24H   ferrous sulfate  325 mg Oral Daily   folic acid  1 mg Oral Daily   HYDROcodone-acetaminophen  1 tablet Oral NOW   lipase/protease/amylase  12,000 Units Oral TID AC   magnesium oxide  400 mg Oral Daily   multivitamin with minerals  1 tablet Oral Daily   nicotine  14 mg Transdermal Daily   pantoprazole  40 mg Oral BID AC   sucralfate  1 g Oral QID   thiamine  100 mg Oral Daily   vancomycin  125 mg Oral Q6H   Continuous inpatient infusions:   DAPTOmycin (CUBICIN) 700 mg in sodium chloride 0.9 % IVPB 700 mg  (09/18/22 2129)   PRN inpatient medications: ondansetron **OR** ondansetron (ZOFRAN) IV, traMADol  Vital signs in last 24 hours: Temp:  [97.9 F (36.6 C)-100.7 F (38.2 C)] 97.9 F (36.6 C) (07/25 1403) Pulse Rate:  [101-127] 105 (07/25 1403) Resp:  [16-32] 19 (07/25 1403) BP: (122-159)/(69-104) 142/82 (07/25 1403) SpO2:  [95 %-100 %] 99 % (07/25 1403) Weight:  [68.4 kg] 68.4 kg (07/24 1520) Last BM Date : 09/19/22  Intake/Output Summary (Last 24 hours) at 09/19/2022 1429 Last data filed at 09/19/2022 1250 Gross per 24 hour  Intake --  Output 250 ml  Net -250 ml    Intake/Output from previous day: No intake/output data recorded. Intake/Output this shift: Total I/O In: -  Out: 250 [Drains:250]   Physical Exam:  General: Alert male in NAD Heart:  Regular rate and rhythm.  Pulmonary: Normal respiratory effort Abdomen: Soft, distended, mild LUQ tenderness. A few bowel sounds. Small amount of thin yellow fluid in abscess drain Neurologic: Alert and oriented Psych: Pleasant. Cooperative. Insight appears normal.    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

## 2022-09-19 NOTE — Progress Notes (Addendum)
AMA discharge note   Samuel Blevins  ZOX:[id number] DOB: 21-Nov-1965 DOA: 09/18/2022 PCP: No primary care provider on file.   Brief Narrative:  57 year old with history of recurrent pancreatitis, chronic pancreatic insufficiency, HTN , recurrent alcohol use comes to the hospital with complaints of abdominal pain, nausea, vomiting and diarrhea.  Patient was also found to have acute recurrent alcoholic pancreatitis.  Initially patient had left AMA about 4-5 days prior to this admission.  Patient readmitted for acute recurrent alcoholic pancreatitis with worsening ascites, initial labs of ascites fluid indicate elevated amylase concerning for pancreatic duct disruption.    Hospital course complicated with presence of recurrent ascites, infected ascites fluid, ongoing abdominal pain and acute renal failure.  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 Being followed by GI, nephrology, and ID.   GI previously recommending transfer to outside facility for ERCP to attempt to stent pancreatic duct disruption. [facility] accepted patient for procedure on 09/17/2022.  Procedure was apparently difficult procedure with manipulation of the pancreas that ultimately had to be accessed via the gastric wall, stent was successfully placed as planned.  Patient's NG tube was instantly removed by staff during procedure then purposefully removed by patient post-anesthesia.  Patient left our facility AMA on the 24th due to "I have to go get my check" -please discuss about risks of leaving hospital.  Fortunately patient did return to our facility later in the evening and was readmitted back to our service.  CT abdomen at intake did show pneumoperitoneum as well as diffuse loculated regions in the abdomen concerning for abscess, IR asked for drain placement.  Dr. Corliss Parish GI at [facility] confirms that pneumoperitoneum is expected given their  approach through the gastric wall.  Will advance diet as tolerated with ultimate plans for discharge home with ultimate outpatient follow-up at [facility] for pancreatic stenting, will discuss IR management of abdominal drain.   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 Corliss Parish.  Plan to repeat ERCP at their facility in 2 weeks to evaluate stent and likely replace with larger diameter stent. -Sepsis evidenced by tachycardia and leukocytosis - source: klebsiella ascites vs cdiff (both presumed POA). -Paracentesis 7/1 - 3L; 7/5 - 3L; 7/8 2L, 7/12 1.7 L. 7/21 600cc -CT 24/25th consistent with loculated abdominal fluid vs abscess -drain placed by IR -Multiple Cortrak's removed by patient -dislodged again intentionally 7/23 at [facility]; will keep NG out; advance diet as tolerated -Cultures(blood and paracentesis) initially negative however cultures on paracentesis from 7/12 shows vancomycin resistant enterococcus - on daptomycin per ID   Improving C. difficile colitis:  PO vancomycin completed per discussion with ID today   Anemia of chronic disease:  Hemoglobin downtrend ongoing, 6.8 today - patient has chart flagged for blood product refusal but he verbally agreed to blood and wants the flag removed. 1u PRBC to transfused successfully - hgb stable  Thrombocytosis -Questionably reactive in the setting of above infection/anemai.  Hypoalbuminemia Hypovolemic hyponatremia -Secondary to above, improving with p.o.  intake as NG tube has been removed we will not replace it -Advance diet as tolerated -full liquids for now advance to soft diet over the next 24 hours pending clinical status.  Recurrent alcohol induced pancreatitis. Nausea vomiting, resolved -Secondary to chronic alcohol use/abuse p.o. intake as tolerated, pain control avoiding IV narcotics, antiemetics, PPI   Severe protein caloric malnutrition High risk for refeeding syndrome -Complicated by chronic alcohol use  Alcohol abuse - Alcohol withdrawal protocol completed, currently outside the window for any further withdrawal symptoms(assuming patient did not have marked amounts of alcohol during the few hours he left AMA on the 24th).   No further need for benzos IV versus p.o.    Essential hypertension -Currently well-controlled off medications, as needed hydralazine for hypertensive events   GERD/duodenitis -Continue PPI   Acute kidney injury with hyperkalemia, resolved:  Initially followed by nephrology treated with [facility], urine output and labs normalizing   Acute hypoxic respiratory failure, transient and resolved:  Acute episode overnight on the night of 09/05/2022 Chest x-ray reported atelectasis, likely secondary to enlarging ascites. Improved s/p paracentesis  Noncompliance, profound, recurrent Acute metabolic/toxic encephalopathy, resolved -Multiple episodes of abnormal behavior previously, multiple episodes of Cortrak removal, nursing staff noted patient eating cigarettes last week.   -Remains alert and oriented, patient previously left AMA on the 24th but did return later that day.  DVT prophylaxis: enoxaparin (LOVENOX) injection 40 mg Start: 09/18/22 1745 Code Status:   Code Status: Full Code Family Communication: None present  Status is: Inpatient  Dispo: The patient is from: Home              Anticipated d/c is to: Home in 24 to 48 hours  Consultants:  GI, ID, general surgery  Procedures:  Multiple  cortrack placements, multiple paracentesis, EEG, abdominal drain placement Patient had pancreaticogastrostomy stent placed at [facility] 7/23 via ERCP and EUS  Antimicrobials:  Daptomycin PO vancomycin completed  Subjective: No acute issues or events overnight, patient returned to the hospital yesterday evening without any further complaints, states his pain is uncontrolled at intake as he had been off narcotics due to leaving the hospital AGAINST MEDICAL ADVICE.  Patient resumed on tramadol, avoid IV narcotics as above but otherwise states his symptoms are well-controlled at this time  Objective: Vitals:   09/18/22 1830 09/18/22 1850 09/18/22 2043 09/19/22 0412  BP: (!) 143/82 (!) 143/82 (!) 159/104 (!) 143/83  Pulse: (!) 109 (!) 111 (!) 109 (!) 101  Resp: (!) 24 20 16 17   Temp:  99.9 F (37.7 C) 98.1 F (36.7 C) 99.2 F (37.3 C)  TempSrc:    Oral  SpO2: 100% 98% 98% 97%  Weight:      Height:       No intake or output data in the 24 hours ending 09/19/22 0756  Filed Weights   09/18/22 1520  Weight: 68.4 kg    Examination:  General:  Pleasantly resting in bed, No acute distress. HEENT:  Normocephalic atraumatic.  Sclerae nonicteric, noninjected.  Extraocular movements intact bilaterally. Neck:  Without mass or deformity.  Trachea is midline. Lungs:  Clear to auscultate bilaterally without rhonchi, wheeze, or rales. Heart:  Regular rate and rhythm.  Without murmurs, rubs, or gallops. Abdomen: Soft tender diffusely, right lower and left lower quadrant drains draining clear yellow fluid. Extremities: Without cyanosis, clubbing, or obvious deformity. Skin:  Warm and dry, no erythema.   LOS: 1 day   Time spent:  Azucena Fallen, DO [facility]  If 7PM-7AM, please contact night-coverage [email or web address]  09/19/2022, 7:56 AM

## 2022-09-19 NOTE — Consult Note (Signed)
[facility]    Date of Admission:  09/18/2022     Total days of antibiotics                Reason for Consult: Spontaneous Bacterial pertonitis Referring Provider: Dr. Natale Milch  Primary Care Provider: No primary care provider on file.   ASSESSMENT:  [name] is a 57 y/o AA male admitted with spontaneous bacterial peritonitis with cultures positive for vancomycin resistant enterococcus faecium who left AMA yesterday and returns with new CT imaging showing likely loculated ascites. IR placed drain and obtained cultures which are pending. Have restarted daptomycin. Course previously complicated with C. Difficile and oral vancomycin has been restarted. Complete oral vancomycin today which will be 14 days.  Febrile upon return with leukocytosis likely secondary to burden of infection and lack of source control. Discussed plan of care with Mr. Silverio. Therapeutic drug monitoring of CK levels while on daptomycin. Remaining medical and supportive care per Internal Medicine.   PLAN:  Continue current dose of daptomycin Monitor cultures for bacteremia and drain cultures for any new organisms Treatment completed for C. Diff.  Therapeutic drug monitoring of CK levels  Remaining medical and supportive care per Internal Medicine.    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   Chronic pain syndrome   Chronic alcohol abuse   Pancreatic insufficiency   Alcohol-induced chronic pancreatitis (HCC)   Acute on chronic pancreatitis (HCC)   Enteritis due to Clostridium difficile    enoxaparin (LOVENOX) injection  40 mg Subcutaneous Q24H   ferrous sulfate  325 mg Oral Daily   folic acid  1 mg Oral Daily   lipase/protease/amylase  12,000 Units Oral TID AC   magnesium oxide  400 mg Oral Daily   multivitamin with minerals  1 tablet Oral Daily   nicotine   14 mg Transdermal Daily   pantoprazole  40 mg Oral BID AC   sucralfate  1 g Oral QID   thiamine  100 mg Oral Daily   vancomycin  125 mg Oral Q6H     HPI: Samuel Blevins is a 57 y.o. male with previous medical history of recurrent pancreatitis, chronic pancreatic insufficiency, alcohol use, and hypertension admitted to the hospital with abdominal pain.  Mr. Usrey was previously hospitalized starting on 08/26/22 where he was hospitalized for spontaneous bacterial peritonitis with peritoneal cultures growing vancomycin resistant enterococcus faecium. Briefly transferred to [facility] to attempt stent placement for biliary duct on 7/23 which was difficult but successful. Previously found to have C. Difficile colitis and was being treated with oral vancomycin. Was on Daptomycin until he left AMA on 7/24 and now returned to the hospital with severe abdominal pain.  CT abdomen/pelvis 7/25 with continued pneumoperitoneum with no free air; moderate ascites and multiple loculated fluid collections possibly loculated ascites and less likely abscess. General Surgery with no current surgical interventions and IR with planned subhepatic abscess drain placement. Received daptomycin, cefepime and metrondiazole upon return which has been narrowed to Daptomycin and oral vancomycin for C. Diff. Febrile with temperature of 100.7 F overnight and leukocytosis with WBC count of 25.5.   Drain placed without complications and cultures have been sent. Having generalized abdominal pain and requesting what they "gave me last night."   Review of Systems: Review of Systems  Constitutional:  Negative for chills, fever and weight loss.  Respiratory:  Negative for cough, shortness of breath and wheezing.   Cardiovascular:  Negative for chest pain and leg swelling.  Gastrointestinal:  Negative for abdominal pain, constipation, diarrhea, nausea and vomiting.  Skin:  Negative for rash.     Past Medical History:   Diagnosis Date   Alcohol withdrawal syndrome with complication (HCC)    Alcoholism (HCC)    Elevated AST (SGOT)    Gastrointestinal hemorrhage    Homeless    Hypertension    Pancreatic insufficiency    takes Creon   Symptomatic anemia 11/23/2015   Thrombocytopenia (HCC) 05/09/2021    Social History   Tobacco Use   Smoking status: Every Day    Current packs/day: 0.50    Average packs/day: 0.5 packs/day for 30.0 years (15.0 ttl pk-yrs)    Types: Cigarettes   Smokeless tobacco: Never  Vaping Use   Vaping status: Never Used  Substance Use Topics   Alcohol use: Yes    Alcohol/week: 28.0 standard drinks of alcohol    Types: 28 Shots of liquor per week   Drug use: Never    Family History  Problem Relation Age of Onset   Diabetes Mellitus II Neg Hx    Colon cancer Neg Hx    Stomach cancer Neg Hx    Pancreatic cancer Neg Hx     No Known Allergies  OBJECTIVE: Blood pressure (!) 152/93, pulse (!) 108, temperature 99 F (37.2 C), resp. rate 20, height 5\' 9"  (1.753 m), weight 68.4 kg, SpO2 100%.  Physical Exam Constitutional:      General: He is not in acute distress.    Appearance: He is well-developed.  Cardiovascular:     Rate and Rhythm: Normal rate and regular rhythm.     Heart sounds: Normal heart sounds.  Pulmonary:     Effort: Pulmonary effort is normal.     Breath sounds: Normal breath sounds.  Skin:    General: Skin is warm and dry.  Neurological:     Mental Status: He is alert.  Psychiatric:        Mood and Affect: Mood normal.     Lab Results Lab Results  Component Value Date   WBC 25.5 (H) 09/19/2022   HGB 7.4 (L) 09/19/2022   HCT 22.2 (L) 09/19/2022   MCV 85.4 09/19/2022   PLT 871 (H) 09/19/2022    Lab Results  Component Value Date   CREATININE 0.85 09/19/2022   BUN 11 09/19/2022   NA 131 (L) 09/19/2022   K 3.7 09/19/2022   CL 99 09/19/2022   CO2 22 09/19/2022    Lab Results  Component Value Date   ALT 35 09/19/2022   AST 35  09/19/2022   ALKPHOS 127 (H) 09/19/2022   BILITOT 0.9 09/19/2022     Microbiology: Recent Results (from the past 240 hour(s))  Resp panel by RT-PCR (RSV, Flu A&B, Covid) Anterior Nasal Swab     Status: None   Collection Time: 09/18/22  3:54 PM   Specimen: Anterior Nasal Swab  Result Value Ref Range Status   SARS Coronavirus 2 by RT PCR NEGATIVE NEGATIVE Final   Influenza A by PCR NEGATIVE NEGATIVE Final   Influenza B by PCR NEGATIVE NEGATIVE Final    Comment: (NOTE) The Xpert Xpress SARS-CoV-2/FLU/RSV plus assay is intended as an aid in the diagnosis of influenza from Nasopharyngeal swab specimens and should not be used as a sole basis for treatment. Nasal washings and aspirates are unacceptable for Xpert Xpress SARS-CoV-2/FLU/RSV testing.  Fact Sheet for Patients: BloggerCourse.com  Fact Sheet for Healthcare Providers: SeriousBroker.it  This test is not yet approved or cleared by the [place] FDA and has been authorized for detection and/or diagnosis of SARS-CoV-2 by FDA under an Emergency Use Authorization (EUA). This EUA will remain in effect (meaning this test can be used) for the duration of the COVID-19 declaration under Section 564(b)(1) of the Act, 21 U.S.C. section 360bbb-3(b)(1), unless the authorization is terminated or revoked.     Resp Syncytial Virus by PCR NEGATIVE NEGATIVE Final    Comment: (NOTE) Fact Sheet for Patients: BloggerCourse.com  Fact Sheet for Healthcare Providers: SeriousBroker.it  This test is not yet approved or cleared by the [place] FDA and has been authorized for detection and/or diagnosis of SARS-CoV-2 by FDA under an Emergency Use Authorization (EUA). This EUA will remain in effect (meaning this test can be used) for the duration of the COVID-19 declaration under Section 564(b)(1) of the Act, 21 U.S.C. section 360bbb-3(b)(1),  unless the authorization is terminated or revoked.  Performed at [facility], [address] [place], [place] [address]   Blood Culture (routine x 2)     Status: None (Preliminary result)   Collection Time: 09/18/22  4:37 PM   Specimen: BLOOD LEFT HAND  Result Value Ref Range Status   Specimen Description BLOOD LEFT HAND  Final   Special Requests   Final    BOTTLES DRAWN AEROBIC AND ANAEROBIC Blood Culture adequate volume   Culture   Final    NO GROWTH < 24 HOURS Performed at [facility], [address], [place], [place] [address]    Report Status PENDING  Incomplete  Blood Culture (routine x 2)     Status: None (Preliminary result)   Collection Time: 09/18/22  4:37 PM   Specimen: BLOOD RIGHT HAND  Result Value Ref Range Status   Specimen Description BLOOD RIGHT HAND  Final   Special Requests   Final    BOTTLES DRAWN AEROBIC AND ANAEROBIC Blood Culture adequate volume   Culture   Final    NO GROWTH < 24 HOURS Performed at [facility], [address], [place], [place] [address]    Report Status PENDING  Incomplete     Marcos Eke, NP [facility]  09/19/2022  10:53 AM

## 2022-09-19 NOTE — TOC Initial Note (Signed)
Transition of Care [facility]) - Initial/Assessment Note    Patient Details  Name: Samuel Blevins MRN: [id number] Date of Birth: 1965/03/31  Transition of Care [facility]) CM/SW Contact:    Lockie Pares, RN Phone Number: 09/19/2022, 5:55 PM  Clinical Narrative:                 Complex patient whom received a long stent at [facility] ( transfer from our facility to there for procedure) signed out AMA on 7/23 . 7/24 returned with peritonitis requiring drain. On IV dapto and po vanco for history of  cdiff.  Patient is homeless and has been at shelters, although has family .   CM will follow for needs    Barriers to Discharge: Continued Medical Work up   Patient Goals and CMS Choice            Expected Discharge Plan and Services       Living arrangements for the past 2 months: Homeless                                      Prior Living Arrangements/Services Living arrangements for the past 2 months: Homeless Lives with:: Self Patient language and need for interpreter reviewed:: Yes        Need for Family Participation in Patient Care: Yes (Comment) Care giver support system in place?: Yes (comment)   Criminal Activity/Legal Involvement Pertinent to Current Situation/Hospitalization: No - Comment as needed  Activities of Daily Living Home Assistive Devices/Equipment: None ADL Screening (condition at time of admission) Patient's cognitive ability adequate to safely complete daily activities?: Yes Is the patient deaf or have difficulty hearing?: No Does the patient have difficulty seeing, even when wearing glasses/contacts?: No Does the patient have difficulty concentrating, remembering, or making decisions?: No Patient able to express need for assistance with ADLs?: No Does the patient have difficulty dressing or bathing?: No Independently performs ADLs?: Yes (appropriate for developmental age) Communication: Independent Dressing (OT): Independent Does the patient  have difficulty walking or climbing stairs?: No Weakness of Legs: None Weakness of Arms/Hands: None  Permission Sought/Granted                  Emotional Assessment       Orientation: : Oriented to Self, Oriented to Place, Oriented to Situation Alcohol / Substance Use: Alcohol Use Psych Involvement: No (comment)  Admission diagnosis:  SBP (spontaneous bacterial peritonitis) (HCC) [K65.2] Sepsis without acute organ dysfunction, due to unspecified organism [facility]) [A41.9] Patient Active Problem List   Diagnosis Date Noted   Bacterial peritonitis (HCC) 09/11/2022   Peritonitis (HCC) 09/10/2022   Enteritis due to Clostridium difficile 09/09/2022   SIRS (systemic inflammatory response syndrome) (HCC) 09/06/2022   Infected ascites 09/06/2022   Anasarca 09/01/2022   Protein-calorie malnutrition, severe 08/30/2022   Alcoholic liver disease (HCC) 08/30/2022   Pancreatic ascites 08/30/2022   Other ascites 08/28/2022   Acute pancreatitis 08/18/2022   Pancreatitis 08/17/2022   SBP (spontaneous bacterial peritonitis) (HCC) 08/17/2022   Hyponatremia 08/17/2022   Urinary tract infection 07/17/2022   Abnormal finding on GI tract imaging 07/13/2022   Abdominal pain, epigastric 07/11/2022   Sepsis (HCC) 06/10/2022   Acute on chronic pancreatitis (HCC) 06/10/2022   Acute kidney injury (nontraumatic) (HCC) 06/10/2022   Fall 04/16/2022   Alcohol withdrawal with inpatient treatment with perceptual disturbance (HCC) 04/13/2022   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  Hypokalemia    Hypomagnesemia    Portal vein thrombosis 08/03/2021   Alcoholic pancreatitis 08/03/2021   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   Anxiety and depression 07/04/2021   Homeless 07/04/2021   Osteoarthritis 07/04/2021   Thrombocytopenia (HCC) 05/09/2021   Pancreatic insufficiency 05/08/2021   Chronic alcohol abuse 05/07/2021   Alcoholic steatohepatitis 12/07/2020   Iron  deficiency anemia due to chronic blood loss 12/07/2020   Seizure (HCC) 12/04/2020   Alcohol withdrawal (HCC) 09/16/2020   Chronic pain syndrome 12/28/2019   Pseudocyst of pancreas due to acute pancreatitis 10/28/2019   Gastritis and gastroduodenitis    AVM (arteriovenous malformation) of small bowel, acquired    Transaminitis    Alcohol dependence syndrome (HCC) 06/21/2019   Chronic anemia 11/23/2015   Tobacco abuse 03/17/2014   Essential hypertension 03/17/2014   PCP:  No primary care provider on file. Pharmacy:   [facility] [address], [place] - [address] [place] [address] Phone: [phone] Fax: [phone]  [place] [facility] [address] [place] [address] Phone: [phone] Fax: [phone]  [facility] [address] [place] [address] Phone: [phone] Fax: [phone]  [facility] [address] [place] [address] Phone: [phone] Fax: [phone]     Social Determinants of Health (SDOH) Social History: SDOH Screenings   Food Insecurity: Food Insecurity Present (09/19/2022)  Housing: High Risk (09/19/2022)  Transportation Needs: Unmet Transportation Needs (09/19/2022)  Utilities: At Risk (09/19/2022)  Depression (PHQ2-9): High Risk (02/14/2022)  Financial Resource Strain: Not on File (06/14/2021)   Received from [place], [place]  Physical Activity: Not on File (06/14/2021)   Received from [place], [place]  Social Connections: Not on File (06/14/2021)   Received from [place], [place]  Stress: Not on File (06/14/2021)   Received from [place], [place]  Tobacco Use: High Risk (09/19/2022)   SDOH Interventions:     Readmission Risk Interventions    09/04/2022   11:40 AM  Readmission Risk Prevention Plan  Transportation Screening Complete  Medication Review (RN Care  Manager) Complete  PCP or Specialist appointment within 3-5 days of discharge Complete  HRI or Home Care Consult Complete  SW Recovery Care/Counseling Consult Complete  Palliative Care Screening Not Applicable  Skilled Nursing Facility Not Applicable

## 2022-09-19 NOTE — Progress Notes (Addendum)
[facility] Progress Note     Subjective: CC:  Tells me he got some medicine last night and has no abdominal pain since. Denies nausea or vomiting. +BM.  Objective: Vital signs in last 24 hours: Temp:  [98.1 F (36.7 C)-100.7 F (38.2 C)] 99.2 F (37.3 C) (07/25 0412) Pulse Rate:  [101-127] 101 (07/25 0412) Resp:  [16-30] 17 (07/25 0412) BP: (112-159)/(69-104) 143/83 (07/25 0412) SpO2:  [95 %-100 %] 97 % (07/25 0412) Weight:  [68.4 kg] 68.4 kg (07/24 1520) Last BM Date : 09/18/22  Intake/Output from previous day: No intake/output data recorded. Intake/Output this shift: No intake/output data recorded.  PE: Gen:  Alert, NAD, cooperative Card: sinus tachycardia Pulm:  Normal effort ORA Abd: Soft, moderate distention, non-tender, he had guarding in the RUQ initially but this was not reproducible, no peritonitis Skin: warm and dry, no rashes  Psych: A&Ox3   Lab Results:  Recent Labs    09/18/22 0712 09/18/22 1637  WBC 19.5* 22.5*  HGB 8.0* 7.4*  HCT 24.4* 22.5*  PLT 989* 997*   BMET Recent Labs    09/18/22 0712 09/18/22 1637  NA 134* 133*  K 4.2 4.1  CL 97* 99  CO2 22 22  GLUCOSE 126* 118*  BUN 14 16  CREATININE 0.96 1.18  CALCIUM 8.8* 8.7*   PT/INR Recent Labs    09/18/22 0712 09/18/22 1637  LABPROT 15.8* 15.5*  INR 1.2 1.2   CMP     Component Value Date/Time   NA 133 (L) 09/18/2022 1637   NA 139 02/14/2022 1024   K 4.1 09/18/2022 1637   CL 99 09/18/2022 1637   CO2 22 09/18/2022 1637   GLUCOSE 118 (H) 09/18/2022 1637   BUN 16 09/18/2022 1637   BUN 5 (L) 02/14/2022 1024   CREATININE 1.18 09/18/2022 1637   CALCIUM 8.7 (L) 09/18/2022 1637   PROT 8.6 (H) 09/18/2022 1637   PROT 8.7 (H) 02/14/2022 1024   ALBUMIN 2.7 (L) 09/18/2022 1637   ALBUMIN 4.5 02/14/2022 1024   AST 60 (H) 09/18/2022 1637   ALT 53 (H) 09/18/2022 1637   ALKPHOS 134 (H) 09/18/2022 1637   BILITOT 0.5 09/18/2022 1637   BILITOT 0.9 02/14/2022 1024   GFRNONAA  >60 09/18/2022 1637   GFRAA 119 12/28/2019 1057   Lipase     Component Value Date/Time   LIPASE 53 (H) 09/18/2022 0712       Studies/Results: CT ABDOMEN PELVIS WO CONTRAST  Result Date: 09/19/2022 CLINICAL DATA:  evaluate for perforated viscous EXAM: CT ABDOMEN AND PELVIS WITHOUT CONTRAST TECHNIQUE: Multidetector CT imaging of the abdomen and pelvis was performed following the standard protocol without IV contrast. RADIATION DOSE REDUCTION: This exam was performed according to the departmental dose-optimization program which includes automated exposure control, adjustment of the mA and/or kV according to patient size and/or use of iterative reconstruction technique. COMPARISON:  09/18/2022.  08/26/2022. FINDINGS: Lower chest: Moderate right pleural effusion. Right lower lobe and right middle lobe airspace disease could reflect atelectasis or infiltrate/pneumonia. Coronary artery and aortic calcifications. Hepatobiliary: No focal hepatic abnormality. Gallbladder unremarkable. Pancreas: Pancreatic ductal stent in place terminating in the stomach. No visible focal pancreatic abnormality. No surrounding inflammation. Spleen: No focal abnormality.  Normal size. Adrenals/Urinary Tract: No adrenal abnormality. No focal renal abnormality. No stones or hydronephrosis. Urinary bladder is unremarkable. Stomach/Bowel: Stomach, large and small bowel grossly unremarkable. Vascular/Lymphatic: Aortic atherosclerosis. No evidence of aneurysm or adenopathy. Reproductive: No visible focal abnormality. Other: Moderate ascites in the  abdomen and pelvis. Multiple focal fluid collections are noted along the greater curvature of the stomach, inferior to the right hepatic lobe, and in the left mid to lower abdomen. Pneumoperitoneum again noted, stable. Musculoskeletal: No acute bony abnormality. IMPRESSION: Continued pneumoperitoneum. No obvious source of the free air noted. No abnormal bowel. Therefore, this is favored to be  related to recent paracentesis. Moderate ascites. Multiple loculated fluid collections as described above. These could reflect loculated ascites, less likely abscess is. The sub a pathic fluid collection or the left lower anterior abdominal fluid collection would be amenable to sampling if felt clinically relevant. Small to moderate right pleural effusion. Compressive atelectasis versus pneumonia in the right middle lobe and right lower lobe. Aortoiliac atherosclerosis. Electronically Signed   By: Charlett Nose M.D.   On: 09/19/2022 01:56   CT ABDOMEN PELVIS W CONTRAST  Result Date: 09/18/2022 CLINICAL DATA:  Abdominal pain EXAM: CT ABDOMEN AND PELVIS WITH CONTRAST TECHNIQUE: Multidetector CT imaging of the abdomen and pelvis was performed using the standard protocol following bolus administration of intravenous contrast. RADIATION DOSE REDUCTION: This exam was performed according to the departmental dose-optimization program which includes automated exposure control, adjustment of the mA and/or kV according to patient size and/or use of iterative reconstruction technique. CONTRAST:  75mL OMNIPAQUE IOHEXOL 350 MG/ML SOLN COMPARISON:  08/26/2022 FINDINGS: Lower chest: There is interval increase in size of right pleural effusion. There is infiltrate with air bronchograms in right lower lung field suggesting worsening of atelectasis. Subsegmental atelectasis is seen in the posterior left lower lung field. There is ectasia of bronchi in the lower lung fields. Hepatobiliary: No focal abnormalities are seen in the liver. There is no dilation of bile ducts. Gallbladder is not distended. Pancreas: Catheter is seen in head and body pancreas extending into the stomach and second portion of duodenum. There is no significant dilation of pancreatic duct. Small calcifications are seen in the head of the pancreas. Spleen: Unremarkable. Adrenals/Urinary Tract: Adrenals are unremarkable. There is no hydronephrosis. There are no  renal or ureteral stones. Urinary bladder is unremarkable. Stomach/Bowel: Stomach is unremarkable. There is no significant dilation of small bowel loops. Appendix is not distinctly visualized. There is no wall thickening in colon. Vascular/Lymphatic: Scattered arterial calcifications are seen in aorta and its major branches. Reproductive: Unremarkable. Other: There is moderate ascites with interval decrease. There is 11.2 x 6.1 cm loculated fluid collection with thick wall inferior to the liver. There is loculated fluid collection between stomach and pancreas measuring proximally 11.8 cm in maximum diameter without thick wall. There is 9.8 by 4.4 cm loculated fluid collection in the left lower quadrant abdomen with slightly thickened wall. There are pockets pneumoperitoneum in upper and mid abdomen. Musculoskeletal: Degenerative changes are noted in lumbar spine, most severe at L5-S1 level. This finding has not changed. IMPRESSION: There is no evidence of intestinal obstruction. There is no hydronephrosis. There are multiple pockets of pneumoperitoneum in upper and mid abdomen. This may be related to recent paracentesis or suggest bowel perforation or infectious peritonitis. There is interval decrease in amount of ascites. Still, there is moderate residual ascites in abdomen and pelvis. There is a loculated thick-walled fluid collection inferior to the right lobe of liver measuring 11.2 cm in maximum diameter. There is another loculated fluid collection measuring 9.8 cm in maximum diameter with slightly thickened wall in left mid to lower abdomen. Possibility of infectious process in the peritoneal cavity is not excluded. Please correlate with laboratory findings and consider  sampling of loculated fluid in right subhepatic location. There is interval worsening of infiltrates in the lower lung fields suggesting atelectasis/pneumonia. There is interval increase in right pleural effusion. Catheter is noted extending  from the stomach through the pancreas to the duodenum. Electronically Signed   By: Ernie Avena M.D.   On: 09/18/2022 18:45   DG Chest Port 1 View  Result Date: 09/18/2022 CLINICAL DATA:  Sepsis EXAM: PORTABLE CHEST 1 VIEW COMPARISON:  X-ray 09/05/2022 and older FINDINGS: Normal cardiopericardial silhouette. Calcified aorta. Left lung is clear. No left-sided consolidation, pneumothorax or effusion. Overlapping cardiac leads. There is a small to moderate right pleural effusion with some adjacent opacity. No edema. There is no clear pneumothorax identified although there is horizontal appearance of the margin of the pleural effusion. Hydropneumothorax is not excluded with this appearance. IMPRESSION: Developing small to moderate right pleural effusion with some adjacent opacity. In addition there is a horizontal orientation to the pleural effusion. Although no pneumothorax is seen, a hydropneumothorax is not excluded with this appearance. Recommend follow up chest CT to further delineate when appropriate. Electronically Signed   By: Karen Kays M.D.   On: 09/18/2022 17:23    Anti-infectives: Anti-infectives (From admission, onward)    Start     Dose/Rate Route Frequency Ordered Stop   09/18/22 2300  metroNIDAZOLE (FLAGYL) IVPB 500 mg        500 mg 100 mL/hr over 60 Minutes Intravenous Every 12 hours 09/18/22 2200     09/18/22 2300  ceFEPIme (MAXIPIME) 2 g in sodium chloride 0.9 % 100 mL IVPB        2 g 200 mL/hr over 30 Minutes Intravenous Every 8 hours 09/18/22 2210     09/18/22 1815  DAPTOmycin (CUBICIN) 700 mg in sodium chloride 0.9 % IVPB        10 mg/kg  70.7 kg (Adjusted) 128 mL/hr over 30 Minutes Intravenous Daily 09/18/22 1803     09/18/22 1800  vancomycin (VANCOCIN) 50 mg/mL oral solution SOLN 125 mg        125 mg Oral Every 6 hours 09/18/22 1738          Assessment/Plan 5/57 y/o M with EtOH-induced pancreatitis, infected pancreatic ascites, and pancreatic duct disruption   S/P ERCP (unsuccessful duct cannulation, pancreas divisum noted), placement of pancreaticogastrostomy stent 7/24 at [facility] by Dr. Edyth Gunnels. -  afebrile, sinus tach (101-122 bpm), hypertensive  - WBC 22.5 yesterday, AM labs pending - CT repeated today with PO contrast was negative for intestinal leak/hollow viscus injury. There is stable pneumoperitoneum. Suspect this is related to recent paracentesis and not a complication of his procedure at [facility]. There is loculated ascites. Patient remains hemodynamically stable this morning and his abdominal pain has improved. Will ask IR to drain loculated intra-abdominal fluid collections and send for GS/Cx. If the patient becomes hemodynamically unstable or deteriorates clinically then he will need exploratory laparotomy. Peri-operative morbidity would be high in the setting of pancreatic ascites.  - continue IV abx (ID following, he is currently on cefepime, flagyl, dapto, and vanc) - NPO  - [facility] will follow closely with you  SBP - VRE on paracentesis fluid from 7/12 C. Diff - positive antigen and toxin on 7/12 Thrombocytosis Protein calorie malnutrition HTN GERD AKI    LOS: 1 day   I reviewed nursing notes, hospitalist notes, last 24 h vitals and pain scores, last 48 h intake and output, last 24 h labs and trends, and last 24 h imaging results.  This care required high  level of medical decision making.   Hosie Spangle, PA-C [facility] Please see Amion for pager number during day hours 7:00am-4:30pm

## 2022-09-19 NOTE — Procedures (Signed)
Vascular and Interventional Radiology Procedure Note  Patient: Samuel Blevins DOB: [birth year] Medical Record Number: [id number] Note Date/Time: 09/19/22 1:22 PM   Performing Physician: Roanna Banning, MD Assistant(s): None  Diagnosis: Intra-abdominal abscesses  Procedure: DRAINAGE CATHETER PLACEMENT OF RUQ and LLQ ABSCESSES  Anesthesia: Conscious Sedation Complications: None Estimated Blood Loss: Minimal Specimens: Sent for Gram Stain, Aerobe Culture, and Anerobe Culture  Findings:  Successful CT-guided placement of 12 F catheters into RUQ and LLQ abscess.  Plan:  - Flush drains with 5 mL Normal Saline every 8 hours. - Follow up drain evaluation / sinogram in 10 day(s).  See detailed procedure note with images in PACS. The patient tolerated the procedure well without incident or complication and was returned to Floor Bed in stable condition.    Roanna Banning, MD [facility]   Pager. [phone] Clinic. [phone]

## 2022-09-19 NOTE — Consult Note (Signed)
Chief Complaint: Patient was seen in consultation today for sub hepatic abscess drain placement Chief Complaint  Patient presents with   Pelvic Pain   at the request of Dr Eliot Ford  Supervising Physician: Roanna Banning  Patient Status: [facility] - In-pt  History of Present Illness: Waller Marcussen is a 57 y.o. male   FULL Code status per pt Alcohol pancreatitis Ascites---last para 09/15/22: 600 cc dark yellow (Hx non malignant) Cdiff VRE Infected ascites and pancreatic duct disruption Pt underwent ERCP [facility] 7/24 Revealing pancreas divisum---pancreaticogastrostomy Left AMA yesterday Back to ED last night Severe abd pain; N/V  CT yesterday:   There are multiple pockets of pneumoperitoneum in upper and mid abdomen. This may be related to recent paracentesis or suggest bowel perforation or infectious peritonitis. There is interval decrease in amount of ascites. Still, there is moderate residual ascites in abdomen and pelvis. There is a loculated thick-walled fluid collection inferior to the right lobe of liver measuring 11.2 cm in maximum diameter. There is another loculated fluid collection measuring 9.8 cm in maximum diameter with slightly thickened wall in left mid to lower abdomen. Possibility of infectious process in the peritoneal cavity is not excluded. Please correlate with laboratory findings and consider sampling of loculated fluid in right subhepatic location.  CCS requesting abscess drain Dr Milford Cage has reviewed imaging and approves procedure     Past Medical History:  Diagnosis Date   Alcohol withdrawal syndrome with complication (HCC)    Alcoholism (HCC)    Elevated AST (SGOT)    Gastrointestinal hemorrhage    Homeless    Hypertension    Pancreatic insufficiency    takes Creon   Symptomatic anemia 11/23/2015   Thrombocytopenia (HCC) 05/09/2021    Past Surgical History:  Procedure Laterality Date   BIOPSY  06/23/2019   Procedure: BIOPSY;  Surgeon:  Shellia Cleverly, DO;  Location: [facility] ENDOSCOPY;  Service: Gastroenterology;;   BIOPSY  12/12/2020   Procedure: BIOPSY;  Surgeon: Lynann Bologna, MD;  Location: [facility] ENDOSCOPY;  Service: Endoscopy;;   BIOPSY  08/04/2021   Procedure: BIOPSY;  Surgeon: Jenel Lucks, MD;  Location: [facility] ENDOSCOPY;  Service: Gastroenterology;;   ESOPHAGOGASTRODUODENOSCOPY N/A 08/04/2021   Procedure: ESOPHAGOGASTRODUODENOSCOPY (EGD);  Surgeon: Jenel Lucks, MD;  Location: [facility] ENDOSCOPY;  Service: Gastroenterology;  Laterality: N/A;   ESOPHAGOGASTRODUODENOSCOPY (EGD) WITH PROPOFOL N/A 06/23/2019   Procedure: ESOPHAGOGASTRODUODENOSCOPY (EGD) WITH PROPOFOL;  Surgeon: Shellia Cleverly, DO;  Location: [facility] ENDOSCOPY;  Service: Gastroenterology;  Laterality: N/A;   ESOPHAGOGASTRODUODENOSCOPY (EGD) WITH PROPOFOL N/A 12/12/2020   Procedure: ESOPHAGOGASTRODUODENOSCOPY (EGD) WITH PROPOFOL;  Surgeon: Lynann Bologna, MD;  Location: [facility] ENDOSCOPY;  Service: Endoscopy;  Laterality: N/A;   HOT HEMOSTASIS N/A 06/23/2019   Procedure: HOT HEMOSTASIS (ARGON PLASMA COAGULATION/BICAP);  Surgeon: Shellia Cleverly, DO;  Location: [facility] ENDOSCOPY;  Service: Gastroenterology;  Laterality: N/A;   IR PARACENTESIS  08/21/2022   IR PARACENTESIS  08/26/2022   IR PARACENTESIS  08/30/2022   IR PARACENTESIS  09/02/2022   IR PARACENTESIS  09/06/2022    Allergies: Patient has no known allergies.  Medications: Prior to Admission medications   Medication Sig Start Date End Date Taking? Authorizing Provider  amLODipine (NORVASC) 5 MG tablet Take 5 mg by mouth daily.   Yes [provider]  carvedilol (COREG) 3.125 MG tablet Take 1 tablet (3.125 mg total) by mouth 2 (two) times daily with a meal. 07/10/22  Yes [name], MD  folic acid (FOLVITE) 1 MG tablet Take 1  tablet (1 mg total) by mouth daily. 07/14/22  Yes Rolly Salter, MD  hydrOXYzine (ATARAX) 25 MG tablet Take 25 mg by mouth at bedtime as needed for anxiety.   Yes [provider]  Iron, Ferrous Sulfate, 325 (65 Fe) MG TABS Take 1 tablet ( 325 mg) by mouth daily. 05/24/22  Yes [name], MD  lipase/protease/amylase (CREON) 12000-38000 units CPEP capsule Take 1 capsule (12,000 Units total) by mouth 3 (three) times daily before meals. 07/14/22  Yes Rolly Salter, MD  magnesium oxide (MAG-OX) 400 MG tablet Take 1 tablet (400 mg total) by mouth in the morning and at bedtime. 07/14/22  Yes Rolly Salter, MD  Multiple Vitamin (MULTIVITAMIN WITH MINERALS) TABS tablet Take 1 tablet by mouth daily. 07/14/22  Yes Rolly Salter, MD  nicotine (NICODERM CQ - DOSED IN MG/24 HOURS) 14 mg/24hr patch Place 1 patch (14 mg total) onto the skin daily. 07/14/22  Yes Rolly Salter, MD  ondansetron (ZOFRAN) 8 MG tablet Take 8 mg by mouth every 6 (six) hours as needed for nausea or vomiting.   Yes [provider]  pantoprazole (PROTONIX) 40 MG tablet Take 1 tablet (40 mg total) by mouth 2 (two) times daily before a meal. 07/14/22  Yes Rolly Salter, MD  polyethylene glycol (MIRALAX / GLYCOLAX) 17 g packet Take 17 g by mouth daily as needed for mild constipation or moderate constipation. 07/14/22  Yes Rolly Salter, MD  sucralfate (CARAFATE) 1 g tablet Take 1 g by mouth See admin instructions. Take 1 tablet by mouth 4 times daily with meals and bedtime   Yes [provider]  thiamine (VITAMIN B-1) 100 MG tablet Take 1 tablet (100 mg total) by mouth daily. 07/14/22  Yes Rolly Salter, MD  traMADol (ULTRAM) 50 MG tablet Take 1 tablet (50 mg total) by mouth every 8 (eight) hours as needed for severe pain. Patient not taking: Reported on 09/18/2022 07/17/22   [name], MD     Family History  Problem Relation Age of Onset   Diabetes Mellitus II Neg Hx    Colon cancer Neg Hx    Stomach cancer Neg Hx    Pancreatic cancer Neg Hx     Social History   Socioeconomic History   Marital status: Single    Spouse name: Not on file   Number of children:  Not on file   Years of education: Not on file   Highest education level: Not on file  Occupational History   Not on file  Tobacco Use   Smoking status: Every Day    Current packs/day: 0.50    Average packs/day: 0.5 packs/day for 30.0 years (15.0 ttl pk-yrs)    Types: Cigarettes   Smokeless tobacco: Never  Vaping Use   Vaping status: Never Used  Substance and Sexual Activity   Alcohol use: Yes    Alcohol/week: 28.0 standard drinks of alcohol    Types: 28 Shots of liquor per week   Drug use: Never   Sexual activity: Not Currently  Other Topics Concern   Not on file  Social History Narrative   Not on file   Social Determinants of Health   Financial Resource Strain: Not on File (06/14/2021)   Received from [employer] [place] Strain    Financial Resource Strain: 0  Food Insecurity: Food Insecurity Present (09/19/2022)   Hunger Vital Sign    Worried About Running Out of Food  in the Last Year: Sometimes true    Ran Out of Food in the Last Year: Sometimes true  Transportation Needs: Unmet Transportation Needs (09/19/2022)   PRAPARE - Administrator, Civil Service (Medical): Yes    Lack of Transportation (Non-Medical): Yes  Physical Activity: Not on File (06/14/2021)   Received from [place], [place]   Physical Activity    Physical Activity: 0  Stress: Not on File (06/14/2021)   Received from [facility], [place]   Stress    Stress: 0  Social Connections: Not on File (06/14/2021)   Received from [place], [place]   Social Connections    Social Connections and Isolation: 0    Review of Systems: A 12 point ROS discussed and pertinent positives are indicated in the HPI above.  All other systems are negative.  Review of Systems  Constitutional:  Positive for activity change and fever.  Respiratory:  Negative for cough and shortness of breath.   Cardiovascular:  Negative for chest pain.  Gastrointestinal:  Positive for abdominal pain and nausea.  Psychiatric/Behavioral:   Negative for behavioral problems and confusion.     Vital Signs: BP (!) 152/93 (BP Location: Right Arm)   Pulse (!) 108   Temp 99 F (37.2 C)   Resp 20   Ht 5\' 9"  (1.753 m)   Wt 150 lb 12.7 oz (68.4 kg)   SpO2 100%   BMI 22.27 kg/m     Physical Exam Vitals reviewed.  HENT:     Mouth/Throat:     Mouth: Mucous membranes are moist.  Cardiovascular:     Rate and Rhythm: Normal rate and regular rhythm.     Heart sounds: Normal heart sounds.  Pulmonary:     Effort: Pulmonary effort is normal.     Breath sounds: Normal breath sounds.  Abdominal:     Palpations: Abdomen is soft.     Tenderness: There is abdominal tenderness.  Musculoskeletal:        General: Normal range of motion.  Skin:    General: Skin is dry.  Neurological:     Mental Status: He is alert and oriented to person, place, and time.  Psychiatric:        Behavior: Behavior normal.     Imaging: CT ABDOMEN PELVIS WO CONTRAST  Result Date: 09/19/2022 CLINICAL DATA:  evaluate for perforated viscous EXAM: CT ABDOMEN AND PELVIS WITHOUT CONTRAST TECHNIQUE: Multidetector CT imaging of the abdomen and pelvis was performed following the standard protocol without IV contrast. RADIATION DOSE REDUCTION: This exam was performed according to the departmental dose-optimization program which includes automated exposure control, adjustment of the mA and/or kV according to patient size and/or use of iterative reconstruction technique. COMPARISON:  09/18/2022.  08/26/2022. FINDINGS: Lower chest: Moderate right pleural effusion. Right lower lobe and right middle lobe airspace disease could reflect atelectasis or infiltrate/pneumonia. Coronary artery and aortic calcifications. Hepatobiliary: No focal hepatic abnormality. Gallbladder unremarkable. Pancreas: Pancreatic ductal stent in place terminating in the stomach. No visible focal pancreatic abnormality. No surrounding inflammation. Spleen: No focal abnormality.  Normal size.  Adrenals/Urinary Tract: No adrenal abnormality. No focal renal abnormality. No stones or hydronephrosis. Urinary bladder is unremarkable. Stomach/Bowel: Stomach, large and small bowel grossly unremarkable. Vascular/Lymphatic: Aortic atherosclerosis. No evidence of aneurysm or adenopathy. Reproductive: No visible focal abnormality. Other: Moderate ascites in the abdomen and pelvis. Multiple focal fluid collections are noted along the greater curvature of the stomach, inferior to the right hepatic lobe, and in the left mid to  lower abdomen. Pneumoperitoneum again noted, stable. Musculoskeletal: No acute bony abnormality. IMPRESSION: Continued pneumoperitoneum. No obvious source of the free air noted. No abnormal bowel. Therefore, this is favored to be related to recent paracentesis. Moderate ascites. Multiple loculated fluid collections as described above. These could reflect loculated ascites, less likely abscess is. The sub a pathic fluid collection or the left lower anterior abdominal fluid collection would be amenable to sampling if felt clinically relevant. Small to moderate right pleural effusion. Compressive atelectasis versus pneumonia in the right middle lobe and right lower lobe. Aortoiliac atherosclerosis. Electronically Signed   By: Charlett Nose M.D.   On: 09/19/2022 01:56   CT ABDOMEN PELVIS W CONTRAST  Result Date: 09/18/2022 CLINICAL DATA:  Abdominal pain EXAM: CT ABDOMEN AND PELVIS WITH CONTRAST TECHNIQUE: Multidetector CT imaging of the abdomen and pelvis was performed using the standard protocol following bolus administration of intravenous contrast. RADIATION DOSE REDUCTION: This exam was performed according to the departmental dose-optimization program which includes automated exposure control, adjustment of the mA and/or kV according to patient size and/or use of iterative reconstruction technique. CONTRAST:  75mL OMNIPAQUE IOHEXOL 350 MG/ML SOLN COMPARISON:  08/26/2022 FINDINGS: Lower chest:  There is interval increase in size of right pleural effusion. There is infiltrate with air bronchograms in right lower lung field suggesting worsening of atelectasis. Subsegmental atelectasis is seen in the posterior left lower lung field. There is ectasia of bronchi in the lower lung fields. Hepatobiliary: No focal abnormalities are seen in the liver. There is no dilation of bile ducts. Gallbladder is not distended. Pancreas: Catheter is seen in head and body pancreas extending into the stomach and second portion of duodenum. There is no significant dilation of pancreatic duct. Small calcifications are seen in the head of the pancreas. Spleen: Unremarkable. Adrenals/Urinary Tract: Adrenals are unremarkable. There is no hydronephrosis. There are no renal or ureteral stones. Urinary bladder is unremarkable. Stomach/Bowel: Stomach is unremarkable. There is no significant dilation of small bowel loops. Appendix is not distinctly visualized. There is no wall thickening in colon. Vascular/Lymphatic: Scattered arterial calcifications are seen in aorta and its major branches. Reproductive: Unremarkable. Other: There is moderate ascites with interval decrease. There is 11.2 x 6.1 cm loculated fluid collection with thick wall inferior to the liver. There is loculated fluid collection between stomach and pancreas measuring proximally 11.8 cm in maximum diameter without thick wall. There is 9.8 by 4.4 cm loculated fluid collection in the left lower quadrant abdomen with slightly thickened wall. There are pockets pneumoperitoneum in upper and mid abdomen. Musculoskeletal: Degenerative changes are noted in lumbar spine, most severe at L5-S1 level. This finding has not changed. IMPRESSION: There is no evidence of intestinal obstruction. There is no hydronephrosis. There are multiple pockets of pneumoperitoneum in upper and mid abdomen. This may be related to recent paracentesis or suggest bowel perforation or infectious  peritonitis. There is interval decrease in amount of ascites. Still, there is moderate residual ascites in abdomen and pelvis. There is a loculated thick-walled fluid collection inferior to the right lobe of liver measuring 11.2 cm in maximum diameter. There is another loculated fluid collection measuring 9.8 cm in maximum diameter with slightly thickened wall in left mid to lower abdomen. Possibility of infectious process in the peritoneal cavity is not excluded. Please correlate with laboratory findings and consider sampling of loculated fluid in right subhepatic location. There is interval worsening of infiltrates in the lower lung fields suggesting atelectasis/pneumonia. There is interval increase in right pleural  effusion. Catheter is noted extending from the stomach through the pancreas to the duodenum. Electronically Signed   By: Ernie Avena M.D.   On: 09/18/2022 18:45   DG Chest Port 1 View  Result Date: 09/18/2022 CLINICAL DATA:  Sepsis EXAM: PORTABLE CHEST 1 VIEW COMPARISON:  X-ray 09/05/2022 and older FINDINGS: Normal cardiopericardial silhouette. Calcified aorta. Left lung is clear. No left-sided consolidation, pneumothorax or effusion. Overlapping cardiac leads. There is a small to moderate right pleural effusion with some adjacent opacity. No edema. There is no clear pneumothorax identified although there is horizontal appearance of the margin of the pleural effusion. Hydropneumothorax is not excluded with this appearance. IMPRESSION: Developing small to moderate right pleural effusion with some adjacent opacity. In addition there is a horizontal orientation to the pleural effusion. Although no pneumothorax is seen, a hydropneumothorax is not excluded with this appearance. Recommend follow up chest CT to further delineate when appropriate. Electronically Signed   By: Karen Kays M.D.   On: 09/18/2022 17:23   US Paracentesis  Result Date: 09/15/2022 INDICATION: Patient with  pancreatitis, recurrent ascites. Request for therapeutic paracentesis. EXAM: ULTRASOUND GUIDED THERAPEUTIC PARACENTESIS MEDICATIONS: 10 mL 1% lidocaine COMPLICATIONS: None immediate. PROCEDURE: Informed written consent was obtained from the patient after a discussion of the risks, benefits and alternatives to treatment. A timeout was performed prior to the initiation of the procedure. Initial ultrasound scanning demonstrates a moderate amount of loculated-appearing ascites within the left lateral abdomen. The left lateral abdomen was prepped and draped in the usual sterile fashion. 1% lidocaine was used for local anesthesia. Following this, a 19 gauge, 7-cm, Yueh catheter was introduced. An ultrasound image was saved for documentation purposes. The paracentesis was performed. The catheter was removed and a dressing was applied. The patient tolerated the procedure well without immediate post procedural complication. FINDINGS: A total of approximately 600 mL of dark, yellow fluid was removed. IMPRESSION: Successful ultrasound-guided paracentesis yielding 600 mL of peritoneal fluid. Performed by: Loyce Dys PA-C Electronically Signed   By: Malachy Moan M.D.   On: 09/15/2022 15:06   DG Abd Portable 1V  Result Date: 09/13/2022 CLINICAL DATA:  [id number] Encounter for feeding tube placement [id number] EXAM: PORTABLE ABDOMEN - 1 VIEW COMPARISON:  09/02/2022. FINDINGS: The bowel gas pattern is non-obstructive. No evidence of pneumoperitoneum. No acute osseous abnormalities. Enteric tube is seen with its tip overlying the left paramedian lower abdomen, likely in the region of duodenal jejunal junction. There is at least small right pleural effusion, new since the prior study. IMPRESSION: 1. Enteric tube tip likely in the region of the duodenal jejunal junction. 2. At least small right pleural effusion, new since the prior study. Electronically Signed   By: Jules Schick M.D.   On: 09/13/2022 10:27   US  RENAL  Result Date: 09/08/2022 CLINICAL DATA:  Acute kidney injury, decompensated cirrhosis EXAM: RENAL / URINARY TRACT ULTRASOUND COMPLETE COMPARISON:  None Available. FINDINGS: Right Kidney: Renal measurements: 10.8 x 6.0 x 6.0 cm = volume: 195 mL. Echogenicity within normal limits. The lower pole the right kidney is obscured by overlying bowel gas. No mass or hydronephrosis visualized. Left Kidney: Renal measurements: 12.8 x 6.8 x 6.0 cm = volume: 222 mL. Echogenicity within normal limits. The upper pole of the left kidney is obscured by overlying bowel gas and osseous structures. No mass or hydronephrosis visualized. Bladder: Appears normal for degree of bladder distention. Other: Mild perihepatic ascites is present. IMPRESSION: 1. No hydronephrosis. 2. Mild ascites. Electronically Signed  By: Helyn Numbers M.D.   On: 09/08/2022 22:05   IR Paracentesis  Result Date: 09/06/2022 INDICATION: 57 year old male inpatient. History of alcohol abuse with chronic pancreatitis. Admitted for acute pancreatitis. Team is requesting therapeutic and diagnostic paracentesis EXAM: ULTRASOUND GUIDED THERAPEUTIC AND DIAGNOSTIC PARACENTESIS MEDICATIONS: Lidocaine 1% 10 mL COMPLICATIONS: None immediate. PROCEDURE: Informed written consent was obtained from the patient after a discussion of the risks, benefits and alternatives to treatment. A timeout was performed prior to the initiation of the procedure. Initial ultrasound scanning demonstrates a small amount of ascites within the right lower abdominal quadrant. The right lower abdomen was prepped and draped in the usual sterile fashion. 1% lidocaine was used for local anesthesia. Following this, a 19 gauge, 7-cm, Yueh catheter was introduced. An ultrasound image was saved for documentation purposes. The paracentesis was performed. The catheter was removed and a dressing was applied. The patient tolerated the procedure well without immediate post procedural complication.  FINDINGS: A total of approximately 1.7 L of amber fluid was removed. Samples were sent to the laboratory as requested by the clinical team. IMPRESSION: Successful ultrasound-guided therapeutic and diagnostic paracentesis yielding 1.7 liters of peritoneal fluid. Performed by: Anders Grant, NP Electronically Signed   By: Richarda Overlie M.D.   On: 09/06/2022 10:07   DG Chest Port 1 View  Result Date: 09/05/2022 CLINICAL DATA:  Tachycardia EXAM: PORTABLE CHEST 1 VIEW COMPARISON:  Chest x-ray 08/26/2022 FINDINGS: Enteric tube extends below the diaphragm. Cardiomediastinal silhouette is within normal limits. There is minimal bibasilar atelectasis and small left pleural effusion. There is no pneumothorax or acute fracture. IMPRESSION: Minimal bibasilar atelectasis and small left pleural effusion. Electronically Signed   By: Darliss Cheney M.D.   On: 09/05/2022 22:25   IR Paracentesis  Result Date: 09/02/2022 INDICATION: Patient with a history of alcohol abuse, chronic pancreatitis admitted with acute pancreatitis and found to have new onset ascites. Interventional radiology asked to perform a diagnostic and therapeutic paracentesis EXAM: ULTRASOUND GUIDED PARACENTESIS MEDICATIONS: 1% lidocaine 20 mL COMPLICATIONS: None immediate. PROCEDURE: Informed written consent was obtained from the patient after a discussion of the risks, benefits and alternatives to treatment. A timeout was performed prior to the initiation of the procedure. Initial ultrasound scanning demonstrates a large amount of ascites within the right lower abdominal quadrant. The right lower abdomen was prepped and draped in the usual sterile fashion. 1% lidocaine was used for local anesthesia. Following this, a 19 gauge, 7-cm, Yueh catheter was introduced. An ultrasound image was saved for documentation purposes. The paracentesis was performed. The catheter was removed and a dressing was applied. The patient tolerated the procedure well without  immediate post procedural complication. FINDINGS: A total of approximately 1.8 L of blood-tinged fluid was removed. Samples were sent to the laboratory as requested by the clinical team. IMPRESSION: Successful ultrasound-guided paracentesis yielding 1.8 liters of peritoneal fluid. Procedure performed by Alwyn Ren NP and supervised by Dr. Lowella Dandy. Electronically Signed   By: Richarda Overlie M.D.   On: 09/02/2022 17:27   DG Abd Portable 1V  Result Date: 09/02/2022 CLINICAL DATA:  57 year old male feeding tube placement. EXAM: PORTABLE ABDOMEN - 1 VIEW COMPARISON:  08/30/2022 and earlier. FINDINGS: Portable AP view at 1044 hours. Enteric feeding tube courses from the visible lower chest into the abdomen, crosses midline and conforms to the duodenum C-loop. The tip is in the midline at the level of the distal duodenum. Paucity of bowel gas in the upper abdomen. Patchy right lung base opacity, not  significantly changed from CT Abdomen and Pelvis on 08/26/2022. IMPRESSION: Enteric feeding tube tip at the level of the distal duodenum. Electronically Signed   By: Odessa Fleming M.D.   On: 09/02/2022 11:21   ECHOCARDIOGRAM COMPLETE  Result Date: 09/01/2022    ECHOCARDIOGRAM REPORT   Patient Name:   CHIVAS NOTZ Date of Exam: 09/01/2022 Medical Rec #:  [id number]          Height:       69.0 in Accession #:    [id number]         Weight:       163.4 lb Date of Birth:  [birth year]          BSA:          1.896 m Patient Age:    57 years           BP:           113/72 mmHg Patient Gender: M                  HR:           103 bpm. Exam Location:  Inpatient Procedure: 2D Echo, Color Doppler and Cardiac Doppler Indications:    I50.9* Heart failure (unspecified)  History:        Patient has no prior history of Echocardiogram examinations.                 Risk Factors:Hypertension and ETOH.  Sonographer:    Irving Burton Senior RDCS Referring Phys: [phone] PAULA M GUENTHER IMPRESSIONS  1. Left ventricular ejection fraction, by estimation, is 60 to  65%. The left ventricle has normal function. The left ventricle has no regional wall motion abnormalities. There is mild concentric left ventricular hypertrophy. Left ventricular diastolic parameters are consistent with Grade I diastolic dysfunction (impaired relaxation).  2. Right ventricular systolic function is normal. The right ventricular size is normal. There is normal pulmonary artery systolic pressure. The estimated right ventricular systolic pressure is 32.6 mmHg.  3. The mitral valve is grossly normal. No evidence of mitral valve regurgitation.  4. The aortic valve is tricuspid. Aortic valve regurgitation is not visualized.  5. The inferior vena cava is normal in size with greater than 50% respiratory variability, suggesting right atrial pressure of 3 mmHg. Comparison(s): No prior Echocardiogram. FINDINGS  Left Ventricle: Left ventricular ejection fraction, by estimation, is 60 to 65%. The left ventricle has normal function. The left ventricle has no regional wall motion abnormalities. The left ventricular internal cavity size was normal in size. There is  mild concentric left ventricular hypertrophy. Left ventricular diastolic parameters are consistent with Grade I diastolic dysfunction (impaired relaxation). Right Ventricle: The right ventricular size is normal. No increase in right ventricular wall thickness. Right ventricular systolic function is normal. There is normal pulmonary artery systolic pressure. The tricuspid regurgitant velocity is 2.72 m/s, and  with an assumed right atrial pressure of 3 mmHg, the estimated right ventricular systolic pressure is 32.6 mmHg. Left Atrium: Left atrial size was normal in size. Right Atrium: Right atrial size was normal in size. Pericardium: Trivial pericardial effusion is present. The pericardial effusion is posterior to the left ventricle. Mitral Valve: The mitral valve is grossly normal. No evidence of mitral valve regurgitation. Tricuspid Valve: The tricuspid  valve is grossly normal. Tricuspid valve regurgitation is mild. Aortic Valve: The aortic valve is tricuspid. Aortic valve regurgitation is not visualized. Pulmonic Valve: The pulmonic valve was grossly normal. Pulmonic valve regurgitation  is trivial. Aorta: The aortic root and ascending aorta are structurally normal, with no evidence of dilitation. Venous: The inferior vena cava is normal in size with greater than 50% respiratory variability, suggesting right atrial pressure of 3 mmHg. IAS/Shunts: No atrial level shunt detected by color flow Doppler.  LEFT VENTRICLE PLAX 2D LVIDd:         3.50 cm   Diastology LVIDs:         2.40 cm   LV e' medial:    7.40 cm/s LV PW:         1.30 cm   LV E/e' medial:  10.1 LV IVS:        1.20 cm   LV e' lateral:   9.03 cm/s LVOT diam:     1.80 cm   LV E/e' lateral: 8.3 LV SV:         45 LV SV Index:   23 LVOT Area:     2.54 cm  RIGHT VENTRICLE RV S prime:     14.90 cm/s TAPSE (M-mode): 1.6 cm LEFT ATRIUM             Index        RIGHT ATRIUM           Index LA diam:        3.80 cm 2.00 cm/m   RA Area:     11.60 cm LA Vol (A2C):   47.2 ml 24.90 ml/m  RA Volume:   22.90 ml  12.08 ml/m LA Vol (A4C):   38.0 ml 20.04 ml/m LA Biplane Vol: 43.0 ml 22.68 ml/m  AORTIC VALVE LVOT Vmax:   110.00 cm/s LVOT Vmean:  80.500 cm/s LVOT VTI:    0.175 m  AORTA Ao Root diam: 2.90 cm Ao Asc diam:  3.30 cm MITRAL VALVE                TRICUSPID VALVE MV Area (PHT): 3.33 cm     TR Peak grad:   29.6 mmHg MV Decel Time: 228 msec     TR Vmax:        272.00 cm/s MV E velocity: 74.90 cm/s MV A velocity: 102.00 cm/s  SHUNTS MV E/A ratio:  0.73         Systemic VTI:  0.18 m                             Systemic Diam: 1.80 cm Nona Dell MD Electronically signed by Nona Dell MD Signature Date/Time: 09/01/2022/12:31:38 PM    Final    MR ABDOMEN MRCP W WO CONTAST  Result Date: 08/30/2022 CLINICAL DATA:  Chronic pancreatitis, new ascites, evaluate for pancreatic duct disruption EXAM: MRI ABDOMEN  WITHOUT AND WITH CONTRAST (INCLUDING MRCP) TECHNIQUE: Multiplanar multisequence MR imaging of the abdomen was performed both before and after the administration of intravenous contrast. Heavily T2-weighted images of the biliary and pancreatic ducts were obtained, and three-dimensional MRCP images were rendered by post processing. CONTRAST:  7mL GADAVIST GADOBUTROL 1 MMOL/ML IV SOLN COMPARISON:  CT abdomen pelvis, 08/26/2022 FINDINGS: Examination is significantly limited by pervasive breath motion artifact throughout. Lower chest: Small bilateral pleural effusions Hepatobiliary: Hepatomegaly, maximum coronal span 20.2 cm. Mild hepatic steatosis, assessment is significantly limited by breath motion artifact on in and opposed phase imaging. No solid liver abnormality is seen. No gallstones. Gallbladder wall thickening, nonspecific in the setting of ascites. No biliary ductal dilatation. Pancreas: Pancreas divisum. No pancreatic  ductal dilatation. No specific evidence of acute pancreatitis. Spleen: Normal in size without significant abnormality. Adrenals/Urinary Tract: Adrenal glands are unremarkable. Kidneys are normal, without renal calculi, solid lesion, or hydronephrosis. Stomach/Bowel: Stomach is within normal limits. Thickening of multiple loops of decompressed small bowel in the central abdomen (series 5, image 38). Vascular/Lymphatic: No significant vascular findings are present. No enlarged abdominal lymph nodes. Other: No abdominal wall hernia or abnormality. Moderate volume ascites throughout the abdomen and pelvis. Musculoskeletal: No acute or significant osseous findings. IMPRESSION: 1. Pancreas divisum. No pancreatic ductal dilatation. No specific evidence of acute pancreatitis. 2. Thickening of multiple loops of decompressed small bowel in the central abdomen, suggestive of nonspecific enteritis. 3. Moderate volume ascites throughout the abdomen and pelvis. Small bilateral pleural effusions. 4.  Hepatomegaly and mild hepatic steatosis. Electronically Signed   By: Jearld Lesch M.D.   On: 08/30/2022 19:54   DG Abd Portable 1V  Result Date: 08/30/2022 CLINICAL DATA:  Feeding tube placement. EXAM: PORTABLE ABDOMEN - 1 VIEW COMPARISON:  08/30/2022 at 2:15 p.m. FINDINGS: Enteric feeding tube has been further inserted. Tube curls in the right mid abdomen, expected location of the distal stomach, metallic tip in the left upper quadrant consistent with the gastric fundus. IMPRESSION: Enteric feeding tube position as detailed above. Electronically Signed   By: Amie Portland M.D.   On: 08/30/2022 16:24   DG Abd Portable 1V  Result Date: 08/30/2022 CLINICAL DATA:  Feeding tube placement. EXAM: PORTABLE ABDOMEN - 1 VIEW COMPARISON:  None Available. FINDINGS: Tip of the weighted enteric tube in the right upper quadrant in the region of the distal stomach or proximal duodenum. No bowel dilatation to suggest obstruction IMPRESSION: Tip of the weighted enteric tube in the right upper quadrant in the region of the distal stomach or proximal duodenum. Electronically Signed   By: Narda Rutherford M.D.   On: 08/30/2022 14:42   IR Paracentesis  Result Date: 08/30/2022 INDICATION: Patient with a history of alcohol abuse, chronic pancreatitis admitted for acute pancreatitis and found to have new onset ascites. Request received to perform diagnostic and therapeutic paracentesis. EXAM: ULTRASOUND GUIDED DIAGNOSTIC AND THERAPEUTIC PARACENTESIS MEDICATIONS: 8 cc 1% lidocaine COMPLICATIONS: None immediate. PROCEDURE: Informed written consent was obtained from the patient after a discussion of the risks, benefits and alternatives to treatment. A timeout was performed prior to the initiation of the procedure. Initial ultrasound scanning demonstrates a moderate amount of ascites within the right lower abdominal quadrant. The right lower abdomen was prepped and draped in the usual sterile fashion. 1% lidocaine was used for local  anesthesia. Following this, a 19 gauge, 7-cm, Yueh catheter was introduced. An ultrasound image was saved for documentation purposes. The paracentesis was performed. The catheter was removed and a dressing was applied. The patient tolerated the procedure well without immediate post procedural complication. FINDINGS: A total of approximately 3 L of yellow-red fluid was removed. Samples were sent to the laboratory as requested by the clinical team. IMPRESSION: Successful ultrasound-guided paracentesis yielding 3 liters of peritoneal fluid. Procedure performed by Mina Marble, PA-C Electronically Signed   By: Roanna Banning M.D.   On: 08/30/2022 12:26   IR Paracentesis  Result Date: 08/26/2022 INDICATION: 57 year old male. History of alcohol abuse with chronic pancreatitis presented to ED with abdominal pain. Patient presents for therapeutic and diagnostic paracentesis EXAM: ULTRASOUND GUIDED THERAPEUTIC AND DIAGNOSTIC PARACENTESIS MEDICATIONS: Lidocaine 1% 10 mL COMPLICATIONS: None immediate. PROCEDURE: Informed written consent was obtained from the patient after a discussion of the  risks, benefits and alternatives to treatment. A timeout was performed prior to the initiation of the procedure. Initial ultrasound scanning demonstrates a moderate amount of ascites within the right lower abdominal quadrant. The right lower abdomen was prepped and draped in the usual sterile fashion. 1% lidocaine was used for local anesthesia. Following this, a 19 gauge, 7-cm, Yueh catheter was introduced. An ultrasound image was saved for documentation purposes. The paracentesis was performed. The catheter was removed and a dressing was applied. The patient tolerated the procedure well without immediate post procedural complication. FINDINGS: A total of approximately 3.1 L of serosanguineous fluid was removed. Samples were sent to the laboratory as requested by the clinical team. IMPRESSION: Successful ultrasound-guided paracentesis  yielding 3.1 liters of peritoneal fluid. Performed by: Anders Grant, NP Electronically Signed   By: Marliss Coots M.D.   On: 08/26/2022 15:28   CT ABDOMEN PELVIS W CONTRAST  Result Date: 08/26/2022 CLINICAL DATA:  Acute abdominal pain EXAM: CT ABDOMEN AND PELVIS WITH CONTRAST TECHNIQUE: Multidetector CT imaging of the abdomen and pelvis was performed using the standard protocol following bolus administration of intravenous contrast. RADIATION DOSE REDUCTION: This exam was performed according to the departmental dose-optimization program which includes automated exposure control, adjustment of the mA and/or kV according to patient size and/or use of iterative reconstruction technique. CONTRAST:  75mL OMNIPAQUE IOHEXOL 350 MG/ML SOLN COMPARISON:  CT 08/21/2022 and older FINDINGS: Lower chest: Coronary artery calcifications are seen. Please correlate for other coronary risk factors. Decreasing pleural effusions and adjacent opacities with some residual. Recommend continued follow-up. Hepatobiliary: Nodular contours of the diffusely fatty liver. Please correlate for history of chronic liver disease. Gallbladder is nondilated. Portal vein is patent. Pancreas: Preserved pancreatic parenchyma. There are some calcifications in the pancreatic head near the course of the pancreatic duct, unchanged from previous. Spleen: Normal in size without focal abnormality. Adrenals/Urinary Tract: Adrenal glands are preserved. Kidneys are unremarkable. No enhancing mass or collecting system dilatation. Normal caliber ureters down to the bladder. Bladder has a preserved contour. Bladder is underdistended. Stomach/Bowel: No oral contrast. Large bowel has a normal course and caliber with scattered stool. Stomach is underdistended. Small bowel is also nondilated. Vascular/Lymphatic: Aortic atherosclerosis. No enlarged abdominal or pelvic lymph nodes. Reproductive: Heterogeneous prostate with some mass effect along the base of the  bladder. Please correlate for BPH changes in the patient's PSA. Other: Moderate diffuse ascites once again identified. The amount of ascites is increased from the prior CT scan. Small fat containing umbilical hernia with some mesh. No free air. Musculoskeletal: Scattered degenerative changes of the spine and pelvis. There are bridging osteophytes along the sacroiliac joints. Trace retrolisthesis of L5 on S1 with some disc bulging and stenosis. IMPRESSION: Increasing ascites. Nodular fatty liver. Stable areas of pancreatic calcification. Decreasing pleural effusions and adjacent opacities with some residual. Recommend continued follow-up Electronically Signed   By: Karen Kays M.D.   On: 08/26/2022 11:09   DG Chest 2 View  Result Date: 08/26/2022 CLINICAL DATA:  Epigastric pain EXAM: CHEST - 2 VIEW COMPARISON:  Previous studies including the examination of 08/17/2022 FINDINGS: Cardiac size is within normal limits. Low lung volumes. Linear densities are seen in the lower lung fields. There are no signs of alveolar pulmonary edema or focal pulmonary consolidation. There is minimal blunting of lateral CP angles. There is no pneumothorax. IMPRESSION: There are increased markings in the lower lung fields, more so on the right side suggesting atelectasis. There is blunting of lateral CP angles suggesting  small bilateral effusions. Electronically Signed   By: Ernie Avena M.D.   On: 08/26/2022 10:35   CT ABDOMEN PELVIS W CONTRAST  Result Date: 08/21/2022 CLINICAL DATA:  Intra-abdominal infection/peritonitis (Ped 0-17y) Peritonitis, not improving after antibiotics; concern for secondary peritonitis and patient with pancreatitis EXAM: CT ABDOMEN AND PELVIS WITH CONTRAST TECHNIQUE: Multidetector CT imaging of the abdomen and pelvis was performed using the standard protocol following bolus administration of intravenous contrast. RADIATION DOSE REDUCTION: This exam was performed according to the departmental  dose-optimization program which includes automated exposure control, adjustment of the mA and/or kV according to patient size and/or use of iterative reconstruction technique. CONTRAST:  75mL OMNIPAQUE IOHEXOL 350 MG/ML SOLN COMPARISON:  08/18/2022 FINDINGS: Lower chest: Small bilateral pleural effusions. Bilateral lower lobe airspace opacities are similar to prior study and could reflect atelectasis or infiltrates. Scattered aortic atherosclerosis. Hepatobiliary: Mild low-density throughout the liver compatible with fatty infiltration. No focal abnormality. Gallbladder unremarkable. Pancreas: Few scattered calcifications in the pancreatic head likely within the parenchyma related to prior pancreatitis. No ductal dilatation. No surrounding inflammation. Spleen: No focal abnormality.  Normal size. Adrenals/Urinary Tract: No adrenal abnormality. No focal renal abnormality. No stones or hydronephrosis. Urinary bladder is unremarkable. Stomach/Bowel: Stomach, large and small bowel grossly unremarkable. Vascular/Lymphatic: Aortic atherosclerosis. No evidence of aneurysm or adenopathy. Reproductive: No visible focal abnormality. Other: Moderate free fluid in the abdomen and pelvis. No free air. Findings similar to prior study. Musculoskeletal: No acute bony abnormality. Degenerative disc and facet disease in the lower lumbar spine. IMPRESSION: Stable moderate free fluid within the abdomen and pelvis. Hepatic steatosis. No current evidence of pancreatitis. Few punctate calcifications in the pancreatic head likely related to chronic pancreatitis. Small bilateral pleural effusions. Bibasilar atelectasis or infiltrates, right greater than left. Electronically Signed   By: Charlett Nose M.D.   On: 08/21/2022 20:48   IR Paracentesis  Result Date: 08/21/2022 INDICATION: Patient with a history of alcohol abuse, chronic pancreatitis admitted with acute pancreatitis and found to have new onset ascites. Interventional radiology  asked to perform a diagnostic and therapeutic paracentesis EXAM: ULTRASOUND GUIDED PARACENTESIS MEDICATIONS: 1% lidocaine 10 mL COMPLICATIONS: None immediate. PROCEDURE: Informed written consent was obtained from the patient after a discussion of the risks, benefits and alternatives to treatment. A timeout was performed prior to the initiation of the procedure. Initial ultrasound scanning demonstrates a large amount of ascites within the right lower abdominal quadrant. The right lower abdomen was prepped and draped in the usual sterile fashion. 1% lidocaine was used for local anesthesia. Following this, a 19 gauge, 7-cm, Yueh catheter was introduced. An ultrasound image was saved for documentation purposes. The paracentesis was performed. The catheter was removed and a dressing was applied. The patient tolerated the procedure well without immediate post procedural complication. FINDINGS: A total of approximately 1 L of bloody fluid was removed. Samples were sent to the laboratory as requested by the clinical team. IMPRESSION: Successful ultrasound-guided paracentesis yielding 1 liters of peritoneal fluid. Procedure performed by Alwyn Ren NP and was supervised by Dr. Deanne Coffer. Electronically Signed   By: Corlis Leak M.D.   On: 08/21/2022 15:48    Labs:  CBC: Recent Labs    09/17/22 1830 09/18/22 0712 09/18/22 1637 09/19/22 0808  WBC 22.8* 19.5* 22.5* 25.5*  HGB 8.2* 8.0* 7.4* 7.4*  HCT 24.8* 24.4* 22.5* 22.2*  PLT 974* 989* 997* 871*    COAGS: Recent Labs    06/13/22 2310 08/17/22 2040 08/20/22 1200 08/26/22 1002 09/17/22 0617  09/18/22 0712 09/18/22 1637 09/19/22 0808  INR 1.1 1.1   < > 1.2 1.2 1.2 1.2 1.3*  APTT 37* 36  --  34  --   --  35  --    < > = values in this interval not displayed.    BMP: Recent Labs    09/16/22 0628 09/17/22 0617 09/18/22 0712 09/18/22 1637  NA 134* 135 134* 133*  K 4.3 4.1 4.2 4.1  CL 96* 101 97* 99  CO2 25 25 22 22   GLUCOSE 132* 107* 126*  118*  BUN 12 10 14 16   CALCIUM 8.5* 8.6* 8.8* 8.7*  CREATININE 0.86 0.77 0.96 1.18  GFRNONAA >60 >60 >60 >60    LIVER FUNCTION TESTS: Recent Labs    09/16/22 0628 09/17/22 0617 09/18/22 0712 09/18/22 1637  BILITOT 0.3 0.6 0.6 0.5  AST 33 61* 76* 60*  ALT 27 40 57* 53*  ALKPHOS 147* 156* 136* 134*  PROT 8.0 8.2* 8.2* 8.6*  ALBUMIN 2.2* 2.3* 2.5* 2.7*    TUMOR MARKERS: No results for input(s): "AFPTM", "CEA", "CA199", "CHROMGRNA" in the last 8760 hours.  Assessment and Plan:  Abd pain Leukocytosis Etoh pancreatitis/ascites Pancreaticogastrostomy Scheduled for subhepatic abscess drain placement Risks and benefits discussed with the patient including bleeding, infection, damage to adjacent structures, bowel perforation/fistula connection, and sepsis.  All of the patient's questions were answered, patient is agreeable to proceed. Consent signed and in chart.    Thank you for this interesting consult.  I greatly enjoyed meeting Samuel Blevins and look forward to participating in their care.  A copy of this report was sent to the requesting provider on this date.  Electronically Signed: Robet Leu, PA-C 09/19/2022, 8:58 AM   I spent a total of 20 Minutes    in face to face in clinical consultation, greater than 50% of which was counseling/coordinating care for abscess drain placement

## 2022-09-20 DIAGNOSIS — K651 Peritoneal abscess: Secondary | ICD-10-CM | POA: Diagnosis not present

## 2022-09-20 DIAGNOSIS — K8689 Other specified diseases of pancreas: Secondary | ICD-10-CM | POA: Diagnosis not present

## 2022-09-20 DIAGNOSIS — R188 Other ascites: Secondary | ICD-10-CM | POA: Diagnosis not present

## 2022-09-20 DIAGNOSIS — K652 Spontaneous bacterial peritonitis: Secondary | ICD-10-CM | POA: Diagnosis not present

## 2022-09-20 DIAGNOSIS — A0472 Enterocolitis due to Clostridium difficile, not specified as recurrent: Secondary | ICD-10-CM | POA: Diagnosis not present

## 2022-09-20 MED ORDER — VANCOMYCIN 50 MG/ML ORAL SOLUTION
125.0000 mg | Freq: Two times a day (BID) | ORAL | Status: DC
  Administered 2022-09-20 – 2022-10-02 (×21): 125 mg via ORAL
  Filled 2022-09-20 (×27): qty 2.5

## 2022-09-20 MED ORDER — SODIUM CHLORIDE 0.9 % IV SOLN
2.0000 g | INTRAVENOUS | Status: DC
  Administered 2022-09-20 – 2022-09-22 (×3): 2 g via INTRAVENOUS
  Filled 2022-09-20 (×3): qty 20

## 2022-09-20 NOTE — Progress Notes (Signed)
AMA discharge note   Samuel Blevins  UXL:[id number] DOB: May 11, 1965 DOA: 09/18/2022 PCP: No primary care provider on file.   Brief Narrative:  57 year old with history of recurrent pancreatitis, chronic pancreatic insufficiency, HTN , recurrent alcohol use comes to the hospital with complaints of abdominal pain, nausea, vomiting and diarrhea.  Patient was also found to have acute recurrent alcoholic pancreatitis.  Initially patient had left AMA about 4-5 days prior to this admission.  Patient readmitted for acute recurrent alcoholic pancreatitis with worsening ascites, initial labs of ascites fluid indicate elevated amylase concerning for pancreatic duct disruption.    Hospital course complicated with presence of recurrent ascites, infected ascites fluid, ongoing abdominal pain and acute renal failure.  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 Being followed by GI, nephrology, and ID.   GI previously recommending transfer to outside facility for ERCP to attempt to stent pancreatic duct disruption. [facility] accepted patient for procedure on 09/17/2022.  Procedure was apparently difficult procedure with manipulation of the pancreas that ultimately had to be accessed via the gastric wall, stent was successfully placed as planned.  Patient's NG tube was instantly removed by staff during procedure then purposefully removed by patient post-anesthesia.  Patient left our facility AMA on the 24th due to "I have to go get my check" -we discussed the numerous risks of leaving hospital.  Fortunately patient did return to our facility later in the evening and was readmitted back to our service.  CT abdomen at intake did show pneumoperitoneum as well as diffuse loculated regions in the abdomen concerning for abscess, IR asked for drain placement.  Dr. Corliss Parish GI at [facility] confirms that pneumoperitoneum is expected given their  approach through the gastric wall.  Will advance diet as tolerated with ultimate plans for discharge home with ultimate outpatient follow-up at [facility] for pancreatic stenting, will discuss IR management of abdominal drain.   Assessment & Plan:   Principal Problem:   SBP (spontaneous bacterial peritonitis) (HCC) Active Problems:   Chronic alcohol abuse   Chronic anemia   Chronic pain syndrome   Pancreatic insufficiency   Pseudocyst of pancreas due to acute pancreatitis   Alcohol-induced chronic pancreatitis (HCC)   Acute on chronic pancreatitis (HCC)   Pancreatic duct disruption   Enteritis due to Clostridium difficile   Abscess of abdominal cavity (HCC)  Decompensated liver cirrhosis with ascites Sepsis secondary to VRE positive pancreatic ascites Questionable bilateral abdominal abscesses -Pancreaticogastrostomy stent placed 09/17/2022 at [facility] with Dr. Corliss Parish.  Plan to repeat ERCP at their facility in 2 weeks to evaluate stent and likely replace with larger diameter stent. -Sepsis evidenced by tachycardia and leukocytosis - source: klebsiella ascites vs cdiff (both presumed POA). -Paracentesis 7/1 - 3L; 7/5 - 3L; 7/8 2L, 7/12 1.7 L. 7/21 600cc -CT 24/25th consistent with loculated abdominal fluid vs abscess -drain placed by IR -Multiple Cortrak's removed by patient -dislodged again intentionally 7/23 at [facility]; will keep NG out; advance diet as tolerated -Cultures(blood and paracentesis) initially negative however cultures on paracentesis from 7/12 shows vancomycin resistant enterococcus - on daptomycin per ID - New cultures from abdominal drain showing proteus species - ID following along.  Ceftriaxone added  Improving C. difficile colitis:  PO vancomycin completed per discussion with ID today -Continue prophylactic vancomycin until completing antibiotic courses as above   Anemia of chronic disease:  Hemoglobin downtrend ongoing, 6.8 today - patient has chart flagged for blood product  refusal but he verbally agreed to blood and wants the flag removed. 1u PRBC to transfused successfully - hgb stable  Thrombocytosis -Questionably reactive in the setting of above infection/anemai.  Hypoalbuminemia Hypovolemic hyponatremia -Secondary to above, improving with p.o. intake as NG tube has been removed we will not replace it -Advance diet as tolerated -full liquids for now advance to soft diet over the next 24 hours pending clinical status.  Recurrent alcohol induced pancreatitis. Nausea vomiting, resolved -Secondary to chronic alcohol use/abuse p.o. intake as tolerated, pain control avoiding IV narcotics, antiemetics, PPI   Severe protein caloric malnutrition High risk for refeeding syndrome -Complicated by chronic alcohol use  Alcohol abuse - Alcohol withdrawal protocol completed, currently outside the window for any further withdrawal symptoms(assuming patient did not have marked amounts of alcohol during the few hours he left AMA on the 24th).   No further need for benzos IV versus p.o.    Essential hypertension -Currently well-controlled off medications, as needed hydralazine for hypertensive events   GERD/duodenitis -Continue PPI   Acute kidney injury with hyperkalemia, resolved:  Initially followed by nephrology treated with [facility], urine output and labs normalizing   Acute hypoxic respiratory failure, transient and resolved:  Acute episode overnight on the night of 09/05/2022 Chest x-ray reported atelectasis, likely secondary to enlarging ascites. Improved s/p paracentesis  Noncompliance, profound, recurrent Acute metabolic/toxic encephalopathy, resolved -Multiple episodes of abnormal behavior previously, multiple episodes of Cortrak removal, nursing staff noted patient eating cigarettes last week.   -Remains alert and oriented, patient previously left AMA on the 24th but did return later that day.  DVT prophylaxis: enoxaparin (LOVENOX) injection 40 mg  Start: 09/18/22 1745 Code Status:   Code Status: Full Code Family Communication: None present  Status is: Inpatient  Dispo: The patient is from: Home              Anticipated d/c is to: Home in 24 to 48 hours  Consultants:  GI, ID, general surgery  Procedures:  Multiple cortrack placements, multiple paracentesis, EEG, abdominal drain placement Patient had pancreaticogastrostomy stent placed at [facility] 7/23 via ERCP and EUS  Antimicrobials:  Daptomycin PO vancomycin completed  Subjective: No acute issues or events overnight, patient admits to ongoing abdominal pain near drainage sites but denies fevers chills nausea vomiting headache shortness of breath or chest pain  Objective: Vitals:   09/19/22 2116 09/20/22 0014 09/20/22 0425 09/20/22 0445  BP: 132/83 (!) 140/85 (!) 144/80 (!) 144/80  Pulse: (!) 104 (!) 105 (!) 114 (!) 105  Resp: 18 18 18 16   Temp: 99.1 F (37.3 C) 98.8 F (37.1 C) 98.5 F (36.9 C) 98.5 F (36.9 C)  TempSrc: Oral Oral Oral Oral  SpO2: 100% 99% 100% 98%  Weight:      Height:        Intake/Output Summary (Last 24 hours) at 09/20/2022 0757 Last data filed at 09/20/2022 0300 Gross per 24 hour  Intake 329.65 ml  Output 960 ml  Net -630.35 ml    Filed Weights   09/18/22 1520  Weight: 68.4 kg    Examination:  General:  Pleasantly resting in bed, No acute distress. HEENT:  Normocephalic atraumatic.  Sclerae nonicteric, noninjected.  Extraocular movements intact bilaterally. Neck:  Without mass or deformity.  Trachea is midline. Lungs:  Clear to auscultate bilaterally without rhonchi, wheeze, or rales. Heart:  Regular rate and rhythm.  Without murmurs, rubs, or gallops. Abdomen: Soft tender diffusely, right lower and left lower quadrant drains draining clear yellow  fluid. Extremities: Without cyanosis, clubbing, or obvious deformity. Skin:  Warm and dry, no erythema.    LOS: 2 days   Time spent:  Azucena Fallen, DO [facility]  If 7PM-7AM, please contact night-coverage [email or web address]  09/20/2022, 7:57 AM

## 2022-09-20 NOTE — Progress Notes (Signed)
Referring Physician(s): Eliot Ford CCS   Supervising Physician: Gilmer Mor  Patient Status:  Samuel Blevins  Chief Complaint:  Acute recurrent alcoholic pancreatitis  CT showed RUQ and LLQ fluid collections  S/p aspiration and drain placement by Dr. Milford Cage on 09/19/22   Subjective:  Patient laying in bed, NAD, RN at bedside. States that he had a rough night due to lower abdominal pain, he had n/v this morning.   Allergies: Patient has no known allergies.  Medications: Prior to Admission medications   Medication Sig Start Date End Date Taking? Authorizing Provider  amLODipine (NORVASC) 5 MG tablet Take 5 mg by mouth daily.   Yes [provider]  carvedilol (COREG) 3.125 MG tablet Take 1 tablet (3.125 mg total) by mouth 2 (two) times daily with a meal. 07/10/22  Yes [name], MD  folic acid (FOLVITE) 1 MG tablet Take 1 tablet (1 mg total) by mouth daily. 07/14/22  Yes Rolly Salter, MD  hydrOXYzine (ATARAX) 25 MG tablet Take 25 mg by mouth at bedtime as needed for anxiety.   Yes [provider]  Iron, Ferrous Sulfate, 325 (65 Fe) MG TABS Take 1 tablet ( 325 mg) by mouth daily. 05/24/22  Yes [name], MD  lipase/protease/amylase (CREON) 12000-38000 units CPEP capsule Take 1 capsule (12,000 Units total) by mouth 3 (three) times daily before meals. 07/14/22  Yes Rolly Salter, MD  magnesium oxide (MAG-OX) 400 MG tablet Take 1 tablet (400 mg total) by mouth in the morning and at bedtime. 07/14/22  Yes Rolly Salter, MD  Multiple Vitamin (MULTIVITAMIN WITH MINERALS) TABS tablet Take 1 tablet by mouth daily. 07/14/22  Yes Rolly Salter, MD  nicotine (NICODERM CQ - DOSED IN MG/24 HOURS) 14 mg/24hr patch Place 1 patch (14 mg total) onto the skin daily. 07/14/22  Yes Rolly Salter, MD  ondansetron (ZOFRAN) 8 MG tablet Take 8 mg by mouth every 6 (six) hours as needed for nausea or vomiting.   Yes [provider]  pantoprazole (PROTONIX) 40 MG  tablet Take 1 tablet (40 mg total) by mouth 2 (two) times daily before a meal. 07/14/22  Yes Rolly Salter, MD  polyethylene glycol (MIRALAX / GLYCOLAX) 17 g packet Take 17 g by mouth daily as needed for mild constipation or moderate constipation. 07/14/22  Yes Rolly Salter, MD  sucralfate (CARAFATE) 1 g tablet Take 1 g by mouth See admin instructions. Take 1 tablet by mouth 4 times daily with meals and bedtime   Yes [provider]  thiamine (VITAMIN B-1) 100 MG tablet Take 1 tablet (100 mg total) by mouth daily. 07/14/22  Yes Rolly Salter, MD  traMADol (ULTRAM) 50 MG tablet Take 1 tablet (50 mg total) by mouth every 8 (eight) hours as needed for severe pain. Patient not taking: Reported on 09/18/2022 07/17/22   [name], MD     Vital Signs: BP 121/81 (BP Location: Left Arm)   Pulse (!) 108   Temp 98.7 F (37.1 C) (Oral)   Resp 17   Ht 5\' 9"  (1.753 m)   Wt 150 lb 12.7 oz (68.4 kg)   SpO2 98%   BMI 22.27 kg/m   Physical Exam Vitals reviewed.  Constitutional:      General: He is not in acute distress.    Appearance: He is not ill-appearing.  HENT:     Head: Normocephalic.  Eyes:     Extraocular Movements: Extraocular  movements intact.  Pulmonary:     Effort: Pulmonary effort is normal.  Abdominal:     General: Abdomen is flat.     Palpations: Abdomen is soft.     Comments: Positive RLQ drain to a suction bulb. Site is unremarkable with no erythema, edema, tenderness, bleeding or drainage. Suture and stat lock in place. Dressing is clean, dry, and intact. ~20 ml of  purulent, tan colored fluid noted in the bulb. Drain aspirates and flushes well.   Positive LLQ drain to a suction bulb. Site is unremarkable with no erythema, edema, tenderness, bleeding or drainage. Suture and stat lock in place. Dressing is clean, dry, and intact. ~10 ml of  simple serous fluid in the bulb. Drain aspirates and flushes well.     Musculoskeletal:     Cervical back: Neck supple.   Neurological:     Mental Status: He is alert.  Psychiatric:        Mood and Affect: Mood normal.        Behavior: Behavior normal.     Imaging: CT GUIDED PERITONEAL/RETROPERITONEAL FLUID DRAIN BY PERC CATH  Result Date: 09/19/2022 INDICATION: [id number] Abscess [id number] EXAM: CT-GUIDED PERCUTANEOUS ABSCESS DRAINAGE CATHETER PLACEMENT AT RIGHT UPPER QUADRANT AND LEFT LOWER QUADRANTS COMPARISON:  CT AP, 09/18/2022 MEDICATIONS: The patient is currently admitted to the hospital and receiving intravenous antibiotics. The antibiotics were administered within an appropriate time frame prior to the initiation of the procedure. ANESTHESIA/SEDATION: Moderate (conscious) sedation was employed during this procedure. A total of Versed 2 mg and Fentanyl 75 mcg was administered intravenously. Moderate Sedation Time: 45 minutes. The patient's level of consciousness and vital signs were monitored continuously by radiology nursing throughout the procedure under my direct supervision. CONTRAST:  None COMPLICATIONS: None immediate. PROCEDURE: RADIATION DOSE REDUCTION: This exam was performed according to the departmental dose-optimization program which includes automated exposure control, adjustment of the mA and/or kV according to patient size and/or use of iterative reconstruction technique. Informed written consent was obtained from the patient and/or patient's representative after a discussion of the risks, benefits and alternatives to treatment. The patient was placed supine on the CT gantry and a pre procedural CT was performed re-demonstrating the known abscess/fluid collection within the RIGHT upper and LEFT lower quadrants. The procedure was planned. A timeout was performed prior to the initiation of the procedure. The RIGHT and LEFT abdomen was prepped and draped in the usual sterile fashion. The procedure began at the patient's RIGHT upper quadrant. The overlying soft tissues were anesthetized with 1% lidocaine with  epinephrine. Appropriate trajectory was planned with the use of a 22 gauge spinal needle. An 18 gauge trocar needle was advanced into the abscess/fluid collection and a short Amplatz super stiff wire was coiled within the collection. Appropriate positioning was confirmed with a limited CT scan. The tract was serially dilated allowing placement of a 12 Fr drainage catheter. The procedure was repeated at the patient's LEFT lower quadrant, allowing placement of a 12 Fr drainage catheter. Appropriate positioning was confirmed with a limited postprocedural CT scan. A representative sample of 5 ml of purulent fluid was aspirated. The tubes were connected to a bulb suction and sutured in place. A dressing was placed. The patient tolerated the procedure well without immediate post procedural complication. IMPRESSION: Successful CT guided placement of a 12 Fr drainage catheters into RIGHT upper and LEFT lower quadrant abscesses. Aspiration of 5 mL sample of purulent fluid. Samples were sent to the laboratory as requested by the  ordering clinical team Roanna Banning, MD [facility] Radiology Electronically Signed   By: Roanna Banning M.D.   On: 09/19/2022 17:32   CT GUIDED PERITONEAL/RETROPERITONEAL FLUID DRAIN BY PERC CATH  Result Date: 09/19/2022 INDICATION: [id number] Abscess [id number] EXAM: CT-GUIDED PERCUTANEOUS ABSCESS DRAINAGE CATHETER PLACEMENT AT RIGHT UPPER QUADRANT AND LEFT LOWER QUADRANTS COMPARISON:  CT AP, 09/18/2022 MEDICATIONS: The patient is currently admitted to the hospital and receiving intravenous antibiotics. The antibiotics were administered within an appropriate time frame prior to the initiation of the procedure. ANESTHESIA/SEDATION: Moderate (conscious) sedation was employed during this procedure. A total of Versed 2 mg and Fentanyl 75 mcg was administered intravenously. Moderate Sedation Time: 45 minutes. The patient's level of consciousness and vital signs  were monitored continuously by radiology nursing throughout the procedure under my direct supervision. CONTRAST:  None COMPLICATIONS: None immediate. PROCEDURE: RADIATION DOSE REDUCTION: This exam was performed according to the departmental dose-optimization program which includes automated exposure control, adjustment of the mA and/or kV according to patient size and/or use of iterative reconstruction technique. Informed written consent was obtained from the patient and/or patient's representative after a discussion of the risks, benefits and alternatives to treatment. The patient was placed supine on the CT gantry and a pre procedural CT was performed re-demonstrating the known abscess/fluid collection within the RIGHT upper and LEFT lower quadrants. The procedure was planned. A timeout was performed prior to the initiation of the procedure. The RIGHT and LEFT abdomen was prepped and draped in the usual sterile fashion. The procedure began at the patient's RIGHT upper quadrant. The overlying soft tissues were anesthetized with 1% lidocaine with epinephrine. Appropriate trajectory was planned with the use of a 22 gauge spinal needle. An 18 gauge trocar needle was advanced into the abscess/fluid collection and a short Amplatz super stiff wire was coiled within the collection. Appropriate positioning was confirmed with a limited CT scan. The tract was serially dilated allowing placement of a 12 Fr drainage catheter. The procedure was repeated at the patient's LEFT lower quadrant, allowing placement of a 12 Fr drainage catheter. Appropriate positioning was confirmed with a limited postprocedural CT scan. A representative sample of 5 ml of purulent fluid was aspirated. The tubes were connected to a bulb suction and sutured in place. A dressing was placed. The patient tolerated the procedure well without immediate post procedural complication. IMPRESSION: Successful CT guided placement of a 12 Fr drainage catheters into  RIGHT upper and LEFT lower quadrant abscesses. Aspiration of 5 mL sample of purulent fluid. Samples were sent to the laboratory as requested by the ordering clinical team Roanna Banning, MD [facility] Radiology Electronically Signed   By: Roanna Banning M.D.   On: 09/19/2022 17:32   CT ABDOMEN PELVIS WO CONTRAST  Result Date: 09/19/2022 CLINICAL DATA:  evaluate for perforated viscous EXAM: CT ABDOMEN AND PELVIS WITHOUT CONTRAST TECHNIQUE: Multidetector CT imaging of the abdomen and pelvis was performed following the standard protocol without IV contrast. RADIATION DOSE REDUCTION: This exam was performed according to the departmental dose-optimization program which includes automated exposure control, adjustment of the mA and/or kV according to patient size and/or use of iterative reconstruction technique. COMPARISON:  09/18/2022.  08/26/2022. FINDINGS: Lower chest: Moderate right pleural effusion. Right lower lobe and right middle lobe airspace disease could reflect atelectasis or infiltrate/pneumonia. Coronary artery and aortic calcifications. Hepatobiliary: No focal hepatic abnormality. Gallbladder unremarkable. Pancreas: Pancreatic ductal stent in place terminating in the stomach. No visible  focal pancreatic abnormality. No surrounding inflammation. Spleen: No focal abnormality.  Normal size. Adrenals/Urinary Tract: No adrenal abnormality. No focal renal abnormality. No stones or hydronephrosis. Urinary bladder is unremarkable. Stomach/Bowel: Stomach, large and small bowel grossly unremarkable. Vascular/Lymphatic: Aortic atherosclerosis. No evidence of aneurysm or adenopathy. Reproductive: No visible focal abnormality. Other: Moderate ascites in the abdomen and pelvis. Multiple focal fluid collections are noted along the greater curvature of the stomach, inferior to the right hepatic lobe, and in the left mid to lower abdomen. Pneumoperitoneum again noted, stable.  Musculoskeletal: No acute bony abnormality. IMPRESSION: Continued pneumoperitoneum. No obvious source of the free air noted. No abnormal bowel. Therefore, this is favored to be related to recent paracentesis. Moderate ascites. Multiple loculated fluid collections as described above. These could reflect loculated ascites, less likely abscess is. The sub a pathic fluid collection or the left lower anterior abdominal fluid collection would be amenable to sampling if felt clinically relevant. Small to moderate right pleural effusion. Compressive atelectasis versus pneumonia in the right middle lobe and right lower lobe. Aortoiliac atherosclerosis. Electronically Signed   By: Charlett Nose M.D.   On: 09/19/2022 01:56   CT ABDOMEN PELVIS W CONTRAST  Result Date: 09/18/2022 CLINICAL DATA:  Abdominal pain EXAM: CT ABDOMEN AND PELVIS WITH CONTRAST TECHNIQUE: Multidetector CT imaging of the abdomen and pelvis was performed using the standard protocol following bolus administration of intravenous contrast. RADIATION DOSE REDUCTION: This exam was performed according to the departmental dose-optimization program which includes automated exposure control, adjustment of the mA and/or kV according to patient size and/or use of iterative reconstruction technique. CONTRAST:  75mL OMNIPAQUE IOHEXOL 350 MG/ML SOLN COMPARISON:  08/26/2022 FINDINGS: Lower chest: There is interval increase in size of right pleural effusion. There is infiltrate with air bronchograms in right lower lung field suggesting worsening of atelectasis. Subsegmental atelectasis is seen in the posterior left lower lung field. There is ectasia of bronchi in the lower lung fields. Hepatobiliary: No focal abnormalities are seen in the liver. There is no dilation of bile ducts. Gallbladder is not distended. Pancreas: Catheter is seen in head and body pancreas extending into the stomach and second portion of duodenum. There is no significant dilation of pancreatic  duct. Small calcifications are seen in the head of the pancreas. Spleen: Unremarkable. Adrenals/Urinary Tract: Adrenals are unremarkable. There is no hydronephrosis. There are no renal or ureteral stones. Urinary bladder is unremarkable. Stomach/Bowel: Stomach is unremarkable. There is no significant dilation of small bowel loops. Appendix is not distinctly visualized. There is no wall thickening in colon. Vascular/Lymphatic: Scattered arterial calcifications are seen in aorta and its major branches. Reproductive: Unremarkable. Other: There is moderate ascites with interval decrease. There is 11.2 x 6.1 cm loculated fluid collection with thick wall inferior to the liver. There is loculated fluid collection between stomach and pancreas measuring proximally 11.8 cm in maximum diameter without thick wall. There is 9.8 by 4.4 cm loculated fluid collection in the left lower quadrant abdomen with slightly thickened wall. There are pockets pneumoperitoneum in upper and mid abdomen. Musculoskeletal: Degenerative changes are noted in lumbar spine, most severe at L5-S1 level. This finding has not changed. IMPRESSION: There is no evidence of intestinal obstruction. There is no hydronephrosis. There are multiple pockets of pneumoperitoneum in upper and mid abdomen. This may be related to recent paracentesis or suggest bowel perforation or infectious peritonitis. There is interval decrease in amount of ascites. Still, there is moderate residual ascites in abdomen and pelvis. There is a  loculated thick-walled fluid collection inferior to the right lobe of liver measuring 11.2 cm in maximum diameter. There is another loculated fluid collection measuring 9.8 cm in maximum diameter with slightly thickened wall in left mid to lower abdomen. Possibility of infectious process in the peritoneal cavity is not excluded. Please correlate with laboratory findings and consider sampling of loculated fluid in right subhepatic location. There  is interval worsening of infiltrates in the lower lung fields suggesting atelectasis/pneumonia. There is interval increase in right pleural effusion. Catheter is noted extending from the stomach through the pancreas to the duodenum. Electronically Signed   By: Ernie Avena M.D.   On: 09/18/2022 18:45   DG Chest Port 1 View  Result Date: 09/18/2022 CLINICAL DATA:  Sepsis EXAM: PORTABLE CHEST 1 VIEW COMPARISON:  X-ray 09/05/2022 and older FINDINGS: Normal cardiopericardial silhouette. Calcified aorta. Left lung is clear. No left-sided consolidation, pneumothorax or effusion. Overlapping cardiac leads. There is a small to moderate right pleural effusion with some adjacent opacity. No edema. There is no clear pneumothorax identified although there is horizontal appearance of the margin of the pleural effusion. Hydropneumothorax is not excluded with this appearance. IMPRESSION: Developing small to moderate right pleural effusion with some adjacent opacity. In addition there is a horizontal orientation to the pleural effusion. Although no pneumothorax is seen, a hydropneumothorax is not excluded with this appearance. Recommend follow up chest CT to further delineate when appropriate. Electronically Signed   By: Karen Kays M.D.   On: 09/18/2022 17:23    Labs:  CBC: Recent Labs    09/18/22 0712 09/18/22 1637 09/19/22 0808 09/20/22 0940  WBC 19.5* 22.5* 25.5* 28.2*  HGB 8.0* 7.4* 7.4* 7.1*  HCT 24.4* 22.5* 22.2* 21.5*  PLT 989* 997* 871* 807*    COAGS: Recent Labs    06/13/22 2310 08/17/22 2040 08/20/22 1200 08/26/22 1002 09/17/22 0617 09/18/22 0712 09/18/22 1637 09/19/22 0808  INR 1.1 1.1   < > 1.2 1.2 1.2 1.2 1.3*  APTT 37* 36  --  34  --   --  35  --    < > = values in this interval not displayed.    BMP: Recent Labs    09/18/22 0712 09/18/22 1637 09/19/22 0808 09/20/22 0940  NA 134* 133* 131* 130*  K 4.2 4.1 3.7 3.3*  CL 97* 99 99 96*  CO2 22 22 22  21*  GLUCOSE  126* 118* 101* 89  BUN 14 16 11 6   CALCIUM 8.8* 8.7* 8.6* 8.2*  CREATININE 0.96 1.18 0.85 0.66  GFRNONAA >60 >60 >60 >60    LIVER FUNCTION TESTS: Recent Labs    09/17/22 0617 09/18/22 0712 09/18/22 1637 09/19/22 0808  BILITOT 0.6 0.6 0.5 0.9  AST 61* 76* 60* 35  ALT 40 57* 53* 35  ALKPHOS 156* 136* 134* 127*  PROT 8.2* 8.2* 8.6* 7.5  ALBUMIN 2.3* 2.5* 2.7* 2.2*    Assessment and Plan:  57 y.o. male with recurrent pancreatitis, chronic pancreatic insufficiency, HTN , recurrent ascites s/p multiple para, admitted on 7/24 due to acute recurrent alcoholic pancreatitis,  CT showed RUQ and LLQ fluid collections, s/p RUQ and LLQ drain placement by Dr. Milford Cage on 09/19/22.   CBC with up trending WBCc 25.5>>28.2; stable anemia and thrombocytosis  VSS - has been tachycardic, afebrile  Cx from RUQ fluid collection growing GNR so far   Only LLQ drain was added to the chart after the procedure yesterday.  RUQ drain added this morning - RN notified and  asked to measure/document output q shift  Output this morning  - RUQ: ~ 20 mL of tan, purulent fluid in the bulb.  - LLQ: ~ 10 mL of serous fluis in the bulb.   Drain Location: RUQ Size: Fr size: 12 Fr Date of placement: 7/25  Currently to: Drain collection device: suction bulb 24 hour output:  Output by Drain (mL) 09/18/22 0701 - 09/18/22 1900 09/18/22 1901 - 09/19/22 0700 09/19/22 0701 - 09/19/22 1900 09/19/22 1901 - 09/20/22 0700 09/20/22 0701 - 09/20/22 1117  Closed System Drain 2 Anterior;Lateral Abdomen Bulb (JP) 12 Fr.   310      Interval imaging/drain manipulation:  None   Current examination: Flushes/aspirates easily.  Insertion site unremarkable. Suture and stat lock in place. Dressed appropriately.   Plan: Continue TID flushes with 5 cc NS. Record output Q shift. Dressing changes QD or PRN if soiled.  Call IR APP or on call IR MD if difficulty flushing or sudden change in drain output.  Repeat imaging/possible  drain injection once output < 10 mL/QD (excluding flush material). Consideration for drain removal if output is < 10 mL/QD (excluding flush material), pending discussion with the providing surgical service.  Discharge planning: Please contact IR APP or on call IR MD prior to patient d/c to ensure appropriate follow up plans are in place. Typically patient will follow up with IR clinic 10-14 days post d/c for repeat imaging/possible drain injection. IR scheduler will contact patient with date/time of appointment. Patient will need to flush drain QD with 5 cc NS, record output QD, dressing changes every 2-3 days or earlier if soiled.   IR will continue to follow - please call with questions or concerns.   Electronically Signed: Willette Brace, PA-C 09/20/2022, 11:17 AM   I spent a total of 15 Minutes at the the patient's bedside AND on the patient's hospital floor or unit, greater than 50% of which was counseling/coordinating care for drain f/u.   This chart was dictated using voice recognition software.  Despite best efforts to proofread,  errors can occur which can change the documentation meaning.

## 2022-09-20 NOTE — Progress Notes (Signed)
[facility]  Date of Admission:  09/18/2022     Total days of antibiotics 22         ASSESSMENT:  Samuel Blevins drain cultures are growing Proteus mirabilis and will add ceftriaxone and restart oral vancomycin for C. Diff prophylaxis. Continues to have generalized abdominal pain and has had 2 bowel movements thus far today. Discussed plan of care to continue with daptomycin and the addition of ceftriaxone and restarting oral vancomycin with hope that drain placement will aid in source and pain control. Drain management per IR and remaining medical and supportive care per Internal Medicine.   PLAN:  Continue current dose of daptomycin.  Add ceftriaxone for new Proteus and restart oral vancomycin for C. Diff prophylaxis.  Drain management per IR Remaining medical and supportive care per Internal Medicine.   Dr. Renold Don is available over the weekend for any ID related concerns.   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   Chronic pain syndrome   Chronic alcohol abuse   Pancreatic insufficiency   Alcohol-induced chronic pancreatitis (HCC)   Acute on chronic pancreatitis (HCC)   Pancreatic duct disruption   Enteritis due to Clostridium difficile   Abscess of abdominal cavity (HCC)    enoxaparin (LOVENOX) injection  40 mg Subcutaneous Q24H   ferrous sulfate  325 mg Oral Daily   folic acid  1 mg Oral Daily   lipase/protease/amylase  12,000 Units Oral TID AC   magnesium oxide  400 mg Oral Daily   multivitamin with minerals  1 tablet Oral Daily   nicotine  14 mg Transdermal Daily   pantoprazole  40 mg Oral BID AC   sodium chloride flush  5 mL Intracatheter Q8H   sucralfate  1 g Oral QID   thiamine  100 mg Oral Daily   vancomycin  125 mg Oral Q12H    SUBJECTIVE:  Afebrile overnight with no acute events. 600 cc out of drain so far today.  Continues to have abdominal pain that is slightly worse and requesting pain medication.   No Known Allergies   Review of Systems: Review of Systems  Constitutional:  Negative for chills, fever and weight loss.  Respiratory:  Negative for cough, shortness of breath and wheezing.   Cardiovascular:  Negative for chest pain and leg swelling.  Gastrointestinal:  Positive for abdominal pain and diarrhea. Negative for constipation, nausea and vomiting.  Skin:  Negative for rash.      OBJECTIVE: Vitals:   09/20/22 0445 09/20/22 0700 09/20/22 0834 09/20/22 1243  BP: (!) 144/80  121/81 126/76  Pulse: (!) 105  (!) 108 100  Resp: 16  17 18   Temp: 98.5 F (36.9 C)  98.7 F (37.1 C) 98.6 F (37 C)  TempSrc: Oral  Oral Oral  SpO2: 98% 99% 98% 99%  Weight:      Height:       Body mass index is 22.27 kg/m.  Physical Exam Constitutional:      General: He is not in acute distress.    Appearance: He is well-developed.  Cardiovascular:     Rate and Rhythm: Normal rate and regular rhythm.     Heart sounds: Normal heart sounds.  Pulmonary:     Effort: Pulmonary effort is normal.     Breath sounds: Normal breath sounds.  Abdominal:     Tenderness: There is abdominal tenderness.  Comments: Percutaneous drain in place with yellow clear drainage.   Skin:    General: Skin is warm and dry.  Neurological:     Mental Status: He is alert and oriented to person, place, and time.  Psychiatric:        Behavior: Behavior normal.        Thought Content: Thought content normal.        Judgment: Judgment normal.     Lab Results Lab Results  Component Value Date   WBC 28.2 (H) 09/20/2022   HGB 7.1 (L) 09/20/2022   HCT 21.5 (L) 09/20/2022   MCV 84.3 09/20/2022   PLT 807 (H) 09/20/2022    Lab Results  Component Value Date   CREATININE 0.66 09/20/2022   BUN 6 09/20/2022   NA 130 (L) 09/20/2022   K 3.3 (L) 09/20/2022   CL 96 (L) 09/20/2022   CO2 21 (L) 09/20/2022    Lab Results   Component Value Date   ALT 35 09/19/2022   AST 35 09/19/2022   ALKPHOS 127 (H) 09/19/2022   BILITOT 0.9 09/19/2022     Microbiology: Recent Results (from the past 240 hour(s))  Resp panel by RT-PCR (RSV, Flu A&B, Covid) Anterior Nasal Swab     Status: None   Collection Time: 09/18/22  3:54 PM   Specimen: Anterior Nasal Swab  Result Value Ref Range Status   SARS Coronavirus 2 by RT PCR NEGATIVE NEGATIVE Final   Influenza A by PCR NEGATIVE NEGATIVE Final   Influenza B by PCR NEGATIVE NEGATIVE Final    Comment: (NOTE) The Xpert Xpress SARS-CoV-2/FLU/RSV plus assay is intended as an aid in the diagnosis of influenza from Nasopharyngeal swab specimens and should not be used as a sole basis for treatment. Nasal washings and aspirates are unacceptable for Xpert Xpress SARS-CoV-2/FLU/RSV testing.  Fact Sheet for Patients: BloggerCourse.com  Fact Sheet for Healthcare Providers: SeriousBroker.it  This test is not yet approved or cleared by the [place] FDA and has been authorized for detection and/or diagnosis of SARS-CoV-2 by FDA under an Emergency Use Authorization (EUA). This EUA will remain in effect (meaning this test can be used) for the duration of the COVID-19 declaration under Section 564(b)(1) of the Act, 21 U.S.C. section 360bbb-3(b)(1), unless the authorization is terminated or revoked.     Resp Syncytial Virus by PCR NEGATIVE NEGATIVE Final    Comment: (NOTE) Fact Sheet for Patients: BloggerCourse.com  Fact Sheet for Healthcare Providers: SeriousBroker.it  This test is not yet approved or cleared by the [place] FDA and has been authorized for detection and/or diagnosis of SARS-CoV-2 by FDA under an Emergency Use Authorization (EUA). This EUA will remain in effect (meaning this test can be used) for the duration of the COVID-19 declaration under Section  564(b)(1) of the Act, 21 U.S.C. section 360bbb-3(b)(1), unless the authorization is terminated or revoked.  Performed at [facility], [address], [place], [place] [address]   Blood Culture (routine x 2)     Status: None (Preliminary result)   Collection Time: 09/18/22  4:37 PM   Specimen: BLOOD LEFT HAND  Result Value Ref Range Status   Specimen Description BLOOD LEFT HAND  Final   Special Requests   Final    BOTTLES DRAWN AEROBIC AND ANAEROBIC Blood Culture adequate volume   Culture   Final    NO GROWTH 2 DAYS Performed at [facility], [address], [place], [place] [address]    Report  Status PENDING  Incomplete  Blood Culture (routine x 2)     Status: None (Preliminary result)   Collection Time: 09/18/22  4:37 PM   Specimen: BLOOD RIGHT HAND  Result Value Ref Range Status   Specimen Description BLOOD RIGHT HAND  Final   Special Requests   Final    BOTTLES DRAWN AEROBIC AND ANAEROBIC Blood Culture adequate volume   Culture   Final    NO GROWTH 2 DAYS Performed at [facility], [address] [place], [place] [address]    Report Status PENDING  Incomplete  Aerobic/Anaerobic Culture w Gram Stain (surgical/deep wound)     Status: None (Preliminary result)   Collection Time: 09/19/22  1:23 PM   Specimen: Abscess  Result Value Ref Range Status   Specimen Description ABSCESS ABDOMEN RIGHT  Final   Special Requests DRAIN  Final   Gram Stain   Final    ABUNDANT WBC PRESENT, PREDOMINANTLY PMN MODERATE GRAM NEGATIVE RODS Performed at [facility], [address] [place], [place] [address]    Culture ABUNDANT PROTEUS MIRABILIS  Final   Report Status PENDING  Incomplete     Marcos Eke, NP [facility]  09/20/2022  1:40 PM

## 2022-09-20 NOTE — Progress Notes (Signed)
Progress Note   LOS: 2 days   Chief Complaint:Chronic pancreatitis with PD disruption     Subjective   Patient reports abdominal pain 8 out of 10.  Had some nausea and vomiting this morning.   Objective   Vital signs in last 24 hours: Temp:  [98.5 F (36.9 C)-99.1 F (37.3 C)] 98.9 F (37.2 C) (07/26 1525) Pulse Rate:  [100-114] 107 (07/26 1525) Resp:  [16-18] 16 (07/26 1525) BP: (121-144)/(74-85) 129/74 (07/26 1525) SpO2:  [95 %-100 %] 95 % (07/26 1525) Last BM Date : 09/20/22 Last BM recorded by nurses in past 5 days Stool Type: Type 6 (Mushy consistency with ragged edges) (09/20/2022  4:23 AM)  General:   male in no acute distress Heart:  Regular rate and rhythm; no murmurs Pulm: Clear anteriorly; no wheezing Abdomen: soft, nondistended, normal bowel sounds in all quadrants.  Mild lower abdominal tenderness. No organomegaly appreciated.  Minimal amount of purulent/yellow-colored fluid in bulbs Extremities:  No edema Neurologic:  Alert and  oriented x4;  No focal deficits.  Psych:  Cooperative. Normal mood and affect.  Intake/Output from previous day: 07/25 0701 - 07/26 0700 In: 329.7 [IV Piggyback:69.7] Out: 960 [Urine:650; Drains:310] Intake/Output this shift: Total I/O In: 10 [Other:10] Out: 600 [Urine:600]  Studies/Results: CT GUIDED PERITONEAL/RETROPERITONEAL FLUID DRAIN BY PERC CATH  Result Date: 09/19/2022 INDICATION: [id number] Abscess [id number] EXAM: CT-GUIDED PERCUTANEOUS ABSCESS DRAINAGE CATHETER PLACEMENT AT RIGHT UPPER QUADRANT AND LEFT LOWER QUADRANTS COMPARISON:  CT AP, 09/18/2022 MEDICATIONS: The patient is currently admitted to the hospital and receiving intravenous antibiotics. The antibiotics were administered within an appropriate time frame prior to the initiation of the procedure. ANESTHESIA/SEDATION: Moderate (conscious) sedation was employed during this procedure. A total of Versed 2 mg and Fentanyl 75 mcg was administered intravenously. Moderate  Sedation Time: 45 minutes. The patient's level of consciousness and vital signs were monitored continuously by radiology nursing throughout the procedure under my direct supervision. CONTRAST:  None COMPLICATIONS: None immediate. PROCEDURE: RADIATION DOSE REDUCTION: This exam was performed according to the departmental dose-optimization program which includes automated exposure control, adjustment of the mA and/or kV according to patient size and/or use of iterative reconstruction technique. Informed written consent was obtained from the patient and/or patient's representative after a discussion of the risks, benefits and alternatives to treatment. The patient was placed supine on the CT gantry and a pre procedural CT was performed re-demonstrating the known abscess/fluid collection within the RIGHT upper and LEFT lower quadrants. The procedure was planned. A timeout was performed prior to the initiation of the procedure. The RIGHT and LEFT abdomen was prepped and draped in the usual sterile fashion. The procedure began at the patient's RIGHT upper quadrant. The overlying soft tissues were anesthetized with 1% lidocaine with epinephrine. Appropriate trajectory was planned with the use of a 22 gauge spinal needle. An 18 gauge trocar needle was advanced into the abscess/fluid collection and a short Amplatz super stiff wire was coiled within the collection. Appropriate positioning was confirmed with a limited CT scan. The tract was serially dilated allowing placement of a 12 Fr drainage catheter. The procedure was repeated at the patient's LEFT lower quadrant, allowing placement of a 12 Fr drainage catheter. Appropriate positioning was confirmed with a limited postprocedural CT scan. A representative sample of 5 ml of purulent fluid was aspirated. The tubes were connected to a bulb suction and sutured in place. A dressing was placed. The patient tolerated the procedure well without immediate post procedural  complication. IMPRESSION: Successful CT guided placement of a 12 Fr drainage catheters into RIGHT upper and LEFT lower quadrant abscesses. Aspiration of 5 mL sample of purulent fluid. Samples were sent to the laboratory as requested by the ordering clinical team Roanna Banning, MD [facility] Radiology Electronically Signed   By: Roanna Banning M.D.   On: 09/19/2022 17:32   CT GUIDED PERITONEAL/RETROPERITONEAL FLUID DRAIN BY PERC CATH  Result Date: 09/19/2022 INDICATION: [id number] Abscess [id number] EXAM: CT-GUIDED PERCUTANEOUS ABSCESS DRAINAGE CATHETER PLACEMENT AT RIGHT UPPER QUADRANT AND LEFT LOWER QUADRANTS COMPARISON:  CT AP, 09/18/2022 MEDICATIONS: The patient is currently admitted to the hospital and receiving intravenous antibiotics. The antibiotics were administered within an appropriate time frame prior to the initiation of the procedure. ANESTHESIA/SEDATION: Moderate (conscious) sedation was employed during this procedure. A total of Versed 2 mg and Fentanyl 75 mcg was administered intravenously. Moderate Sedation Time: 45 minutes. The patient's level of consciousness and vital signs were monitored continuously by radiology nursing throughout the procedure under my direct supervision. CONTRAST:  None COMPLICATIONS: None immediate. PROCEDURE: RADIATION DOSE REDUCTION: This exam was performed according to the departmental dose-optimization program which includes automated exposure control, adjustment of the mA and/or kV according to patient size and/or use of iterative reconstruction technique. Informed written consent was obtained from the patient and/or patient's representative after a discussion of the risks, benefits and alternatives to treatment. The patient was placed supine on the CT gantry and a pre procedural CT was performed re-demonstrating the known abscess/fluid collection within the RIGHT upper and LEFT lower quadrants. The procedure was planned. A  timeout was performed prior to the initiation of the procedure. The RIGHT and LEFT abdomen was prepped and draped in the usual sterile fashion. The procedure began at the patient's RIGHT upper quadrant. The overlying soft tissues were anesthetized with 1% lidocaine with epinephrine. Appropriate trajectory was planned with the use of a 22 gauge spinal needle. An 18 gauge trocar needle was advanced into the abscess/fluid collection and a short Amplatz super stiff wire was coiled within the collection. Appropriate positioning was confirmed with a limited CT scan. The tract was serially dilated allowing placement of a 12 Fr drainage catheter. The procedure was repeated at the patient's LEFT lower quadrant, allowing placement of a 12 Fr drainage catheter. Appropriate positioning was confirmed with a limited postprocedural CT scan. A representative sample of 5 ml of purulent fluid was aspirated. The tubes were connected to a bulb suction and sutured in place. A dressing was placed. The patient tolerated the procedure well without immediate post procedural complication. IMPRESSION: Successful CT guided placement of a 12 Fr drainage catheters into RIGHT upper and LEFT lower quadrant abscesses. Aspiration of 5 mL sample of purulent fluid. Samples were sent to the laboratory as requested by the ordering clinical team Roanna Banning, MD [facility] Radiology Electronically Signed   By: Roanna Banning M.D.   On: 09/19/2022 17:32   CT ABDOMEN PELVIS WO CONTRAST  Result Date: 09/19/2022 CLINICAL DATA:  evaluate for perforated viscous EXAM: CT ABDOMEN AND PELVIS WITHOUT CONTRAST TECHNIQUE: Multidetector CT imaging of the abdomen and pelvis was performed following the standard protocol without IV contrast. RADIATION DOSE REDUCTION: This exam was performed according to the departmental dose-optimization program which includes automated exposure control, adjustment of the mA and/or kV  according to patient size and/or use of iterative reconstruction technique. COMPARISON:  09/18/2022.  08/26/2022. FINDINGS: Lower chest: Moderate right pleural effusion.  Right lower lobe and right middle lobe airspace disease could reflect atelectasis or infiltrate/pneumonia. Coronary artery and aortic calcifications. Hepatobiliary: No focal hepatic abnormality. Gallbladder unremarkable. Pancreas: Pancreatic ductal stent in place terminating in the stomach. No visible focal pancreatic abnormality. No surrounding inflammation. Spleen: No focal abnormality.  Normal size. Adrenals/Urinary Tract: No adrenal abnormality. No focal renal abnormality. No stones or hydronephrosis. Urinary bladder is unremarkable. Stomach/Bowel: Stomach, large and small bowel grossly unremarkable. Vascular/Lymphatic: Aortic atherosclerosis. No evidence of aneurysm or adenopathy. Reproductive: No visible focal abnormality. Other: Moderate ascites in the abdomen and pelvis. Multiple focal fluid collections are noted along the greater curvature of the stomach, inferior to the right hepatic lobe, and in the left mid to lower abdomen. Pneumoperitoneum again noted, stable. Musculoskeletal: No acute bony abnormality. IMPRESSION: Continued pneumoperitoneum. No obvious source of the free air noted. No abnormal bowel. Therefore, this is favored to be related to recent paracentesis. Moderate ascites. Multiple loculated fluid collections as described above. These could reflect loculated ascites, less likely abscess is. The sub a pathic fluid collection or the left lower anterior abdominal fluid collection would be amenable to sampling if felt clinically relevant. Small to moderate right pleural effusion. Compressive atelectasis versus pneumonia in the right middle lobe and right lower lobe. Aortoiliac atherosclerosis. Electronically Signed   By: Charlett Nose M.D.   On: 09/19/2022 01:56   CT ABDOMEN PELVIS W CONTRAST  Result Date: 09/18/2022 CLINICAL  DATA:  Abdominal pain EXAM: CT ABDOMEN AND PELVIS WITH CONTRAST TECHNIQUE: Multidetector CT imaging of the abdomen and pelvis was performed using the standard protocol following bolus administration of intravenous contrast. RADIATION DOSE REDUCTION: This exam was performed according to the departmental dose-optimization program which includes automated exposure control, adjustment of the mA and/or kV according to patient size and/or use of iterative reconstruction technique. CONTRAST:  75mL OMNIPAQUE IOHEXOL 350 MG/ML SOLN COMPARISON:  08/26/2022 FINDINGS: Lower chest: There is interval increase in size of right pleural effusion. There is infiltrate with air bronchograms in right lower lung field suggesting worsening of atelectasis. Subsegmental atelectasis is seen in the posterior left lower lung field. There is ectasia of bronchi in the lower lung fields. Hepatobiliary: No focal abnormalities are seen in the liver. There is no dilation of bile ducts. Gallbladder is not distended. Pancreas: Catheter is seen in head and body pancreas extending into the stomach and second portion of duodenum. There is no significant dilation of pancreatic duct. Small calcifications are seen in the head of the pancreas. Spleen: Unremarkable. Adrenals/Urinary Tract: Adrenals are unremarkable. There is no hydronephrosis. There are no renal or ureteral stones. Urinary bladder is unremarkable. Stomach/Bowel: Stomach is unremarkable. There is no significant dilation of small bowel loops. Appendix is not distinctly visualized. There is no wall thickening in colon. Vascular/Lymphatic: Scattered arterial calcifications are seen in aorta and its major branches. Reproductive: Unremarkable. Other: There is moderate ascites with interval decrease. There is 11.2 x 6.1 cm loculated fluid collection with thick wall inferior to the liver. There is loculated fluid collection between stomach and pancreas measuring proximally 11.8 cm in maximum diameter  without thick wall. There is 9.8 by 4.4 cm loculated fluid collection in the left lower quadrant abdomen with slightly thickened wall. There are pockets pneumoperitoneum in upper and mid abdomen. Musculoskeletal: Degenerative changes are noted in lumbar spine, most severe at L5-S1 level. This finding has not changed. IMPRESSION: There is no evidence of intestinal obstruction. There is no hydronephrosis. There are multiple pockets of pneumoperitoneum in upper  and mid abdomen. This may be related to recent paracentesis or suggest bowel perforation or infectious peritonitis. There is interval decrease in amount of ascites. Still, there is moderate residual ascites in abdomen and pelvis. There is a loculated thick-walled fluid collection inferior to the right lobe of liver measuring 11.2 cm in maximum diameter. There is another loculated fluid collection measuring 9.8 cm in maximum diameter with slightly thickened wall in left mid to lower abdomen. Possibility of infectious process in the peritoneal cavity is not excluded. Please correlate with laboratory findings and consider sampling of loculated fluid in right subhepatic location. There is interval worsening of infiltrates in the lower lung fields suggesting atelectasis/pneumonia. There is interval increase in right pleural effusion. Catheter is noted extending from the stomach through the pancreas to the duodenum. Electronically Signed   By: Ernie Avena M.D.   On: 09/18/2022 18:45   DG Chest Port 1 View  Result Date: 09/18/2022 CLINICAL DATA:  Sepsis EXAM: PORTABLE CHEST 1 VIEW COMPARISON:  X-ray 09/05/2022 and older FINDINGS: Normal cardiopericardial silhouette. Calcified aorta. Left lung is clear. No left-sided consolidation, pneumothorax or effusion. Overlapping cardiac leads. There is a small to moderate right pleural effusion with some adjacent opacity. No edema. There is no clear pneumothorax identified although there is horizontal appearance of  the margin of the pleural effusion. Hydropneumothorax is not excluded with this appearance. IMPRESSION: Developing small to moderate right pleural effusion with some adjacent opacity. In addition there is a horizontal orientation to the pleural effusion. Although no pneumothorax is seen, a hydropneumothorax is not excluded with this appearance. Recommend follow up chest CT to further delineate when appropriate. Electronically Signed   By: Karen Kays M.D.   On: 09/18/2022 17:23    Lab Results: Recent Labs    09/18/22 1637 09/19/22 0808 09/20/22 0940  WBC 22.5* 25.5* 28.2*  HGB 7.4* 7.4* 7.1*  HCT 22.5* 22.2* 21.5*  PLT 997* 871* 807*   BMET Recent Labs    09/18/22 1637 09/19/22 0808 09/20/22 0940  NA 133* 131* 130*  K 4.1 3.7 3.3*  CL 99 99 96*  CO2 22 22 21*  GLUCOSE 118* 101* 89  BUN 16 11 6   CREATININE 1.18 0.85 0.66  CALCIUM 8.7* 8.6* 8.2*   LFT Recent Labs    09/19/22 0808  PROT 7.5  ALBUMIN 2.2*  AST 35  ALT 35  ALKPHOS 127*  BILITOT 0.9   PT/INR Recent Labs    09/18/22 1637 09/19/22 0808  LABPROT 15.5* 16.5*  INR 1.2 1.3*     Scheduled Meds:  enoxaparin (LOVENOX) injection  40 mg Subcutaneous Q24H   ferrous sulfate  325 mg Oral Daily   folic acid  1 mg Oral Daily   lipase/protease/amylase  12,000 Units Oral TID AC   magnesium oxide  400 mg Oral Daily   multivitamin with minerals  1 tablet Oral Daily   nicotine  14 mg Transdermal Daily   pantoprazole  40 mg Oral BID AC   sodium chloride flush  5 mL Intracatheter Q8H   sucralfate  1 g Oral QID   thiamine  100 mg Oral Daily   vancomycin  125 mg Oral Q12H   Continuous Infusions:  cefTRIAXone (ROCEPHIN)  IV 2 g (09/20/22 1030)   DAPTOmycin (CUBICIN) 700 mg in sodium chloride 0.9 % IVPB 700 mg (09/20/22 1356)      Patient profile:   Samuel Blevins is a 57 y.o. year old male with a history of  chronic pancreatitis, etoh abuse, admitted with persistent / recurrent infected peritonitis.  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  However he could still have cirrhosis as imaging this admission coincidentally does show a"nodular" contour of fatty liver. More recent imaging ( MRI) shows only hepatomegaly and steatosis. He has had a prolonged admission.     Impression/Plan:   Prolonged hospitalization for chronic pancreatitis, PD disruption and VRE infected pancreatic ascites . Transported for the day to [facility] on 7/23 and underwent EUS guided pancreaticogastrostomy for treatment of PD disruption.  A 15 cm long X 5 Fr  stent was placed into the pancreatic duct through the pancreaticogastrostomy. That evening he complained of left sided abdominal pain. Lipase was normal but WBC rose from 14.6 >> 22K. Patient left AMA the following day (09/18/22) but returned later that afternoon. On arrival he was found to have sepsis. CT abd/pelvis showed multiple pockets of focal fluid collections along the greater curvature of the stomach,  moderate ascites and pneumoperitoneum. --CT negative for intestinal leak. S/p abscess drain placement by IR 7/25 -- Dr. Edyth Gunnels reports patient's pneumoperitoneum is not of concern and is to be expected -- ID following and patient is on daptomycin for VRE peritonitis -- Continue Creon 12k units TID -- Due to return to [facility] in one week for upsizing of stent  C-diff infection.  --Course of Vancomycin is now complete.  --No significant diarrhea  Aydien Majette Leanna Sato  09/20/2022, 3:34 PM

## 2022-09-21 DIAGNOSIS — K652 Spontaneous bacterial peritonitis: Secondary | ICD-10-CM | POA: Diagnosis not present

## 2022-09-21 NOTE — Plan of Care (Signed)
  Problem: Education: Goal: Knowledge of General Education information will improve Description: Including pain rating scale, medication(s)/side effects and non-pharmacologic comfort measures Outcome: Progressing   Problem: Clinical Measurements: Goal: Ability to maintain clinical measurements within normal limits will improve Outcome: Progressing Goal: Diagnostic test results will improve Outcome: Progressing   Problem: Activity: Goal: Risk for activity intolerance will decrease Outcome: Progressing   Problem: Clinical Measurements: Goal: Will remain free from infection Outcome: Not Progressing

## 2022-09-21 NOTE — Progress Notes (Addendum)
AMA discharge note   Samuel Blevins  NIO:[id number] DOB: 01/10/1966 DOA: 09/18/2022 PCP: No primary care provider on file.   Brief Narrative:  57 year old with history of recurrent pancreatitis, chronic pancreatic insufficiency, HTN , recurrent alcohol use comes to the hospital with complaints of abdominal pain, nausea, vomiting and diarrhea.  Patient was also found to have acute recurrent alcoholic pancreatitis.  Initially patient had left AMA about 4-5 days prior to this admission.  Patient readmitted for acute recurrent alcoholic pancreatitis with worsening ascites, initial labs of ascites fluid indicate elevated amylase concerning for pancreatic duct disruption.    Hospital course complicated with presence of recurrent ascites, infected ascites fluid, ongoing abdominal pain and acute renal failure.  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 Being followed by GI, nephrology, and ID.   GI previously recommending transfer to outside facility for ERCP to attempt to stent pancreatic duct disruption. [facility] accepted patient for procedure on 09/17/2022.  Procedure was apparently difficult procedure with manipulation of the pancreas that ultimately had to be accessed via the gastric wall, stent was successfully placed as planned.  Patient's NG tube was instantly removed by staff during procedure then purposefully removed by patient post-anesthesia.  Patient left our facility AMA on the 24th due to "I have to go get my check" -we discussed the numerous risks of leaving hospital.  Fortunately patient did return to our facility later in the evening and was readmitted back to our service.  CT abdomen at intake did show pneumoperitoneum as well as diffuse loculated regions in the abdomen concerning for abscess, IR asked for drain placement.  Dr. Corliss Parish GI at [facility] confirms that pneumoperitoneum is expected given their  approach through the gastric wall.  Will advance diet as tolerated with ultimate plans for discharge home with ultimate outpatient follow-up at [facility] for pancreatic stenting, will discuss IR management of abdominal drain.  Assessment & Plan:   Principal Problem:   SBP (spontaneous bacterial peritonitis) (HCC) Active Problems:   Chronic alcohol abuse   Chronic anemia   Chronic pain syndrome   Pancreatic insufficiency   Pseudocyst of pancreas due to acute pancreatitis   Alcohol-induced chronic pancreatitis (HCC)   Acute on chronic pancreatitis (HCC)   Pancreatic duct disruption   Enteritis due to Clostridium difficile   Abscess of abdominal cavity (HCC)  Decompensated liver cirrhosis with ascites Sepsis secondary to VRE positive pancreatic ascites Questionable bilateral abdominal abscesses -Pancreaticogastrostomy stent placed 09/17/2022 at [facility] with Dr. Corliss Parish.  Plan to repeat ERCP at their facility in 2 weeks to evaluate stent and likely replace with larger diameter stent. -Sepsis evidenced by tachycardia and leukocytosis - source: klebsiella ascites vs cdiff (both presumed POA). -Paracentesis 7/1 - 3L; 7/5 - 3L; 7/8 2L, 7/12 1.7 L. 7/21 600cc -CT 24/25th consistent with loculated abdominal fluid vs abscess -drain placed by IR -Multiple Cortrak's removed by patient -dislodged again intentionally 7/23 at [facility]; will keep NG out; advance diet as tolerated -Cultures(blood and paracentesis) initially negative however cultures on paracentesis from 7/12 shows vancomycin resistant enterococcus - on daptomycin per ID - New cultures from abdominal drain showing proteus species - ID following along.  Ceftriaxone added - GI recommending repeat paracentesis next week to check amylase levels and evidence of ongoing infection.  Improving C. difficile colitis:  PO vancomycin completed per discussion with ID today -Continue prophylactic vancomycin until completing antibiotic courses as above   Anemia  of  chronic disease:  Hemoglobin downtrend ongoing, 6.8 today - patient has chart flagged for blood product refusal but he verbally agreed to blood and wants the flag removed. 1u PRBC to transfused successfully - hgb stable  Thrombocytosis -Questionably reactive in the setting of above infection/anemai.  Hypoalbuminemia Hypovolemic hyponatremia -Secondary to above, improving with p.o. intake as NG tube has been removed we will not replace it -Advance diet as tolerated -soft diet to regular over the next 24 hours pending clinical status.  Recurrent alcohol induced pancreatitis. Nausea vomiting, resolved -Secondary to chronic alcohol use/abuse p.o. intake as tolerated, pain control avoiding IV narcotics, antiemetics, PPI   Severe protein caloric malnutrition High risk for refeeding syndrome -Complicated by chronic alcohol use  Alcohol abuse - Alcohol withdrawal protocol completed, currently outside the window for any further withdrawal symptoms(assuming patient did not have marked amounts of alcohol during the few hours he left AMA on the 24th).   No further need for benzos IV versus p.o.    Essential hypertension -Currently well-controlled off medications, as needed hydralazine for hypertensive events   GERD/duodenitis -Continue PPI   Acute kidney injury with hyperkalemia, resolved:  Initially followed by nephrology treated with [facility], urine output and labs normalizing   Acute hypoxic respiratory failure, transient and resolved:  Acute episode overnight on the night of 09/05/2022 Chest x-ray reported atelectasis, likely secondary to enlarging ascites. Improved s/p paracentesis  Noncompliance, profound, recurrent Acute metabolic/toxic encephalopathy, resolved -Multiple episodes of abnormal behavior previously, multiple episodes of Cortrak removal, nursing staff noted patient eating cigarettes last week.   -Remains alert and oriented, patient previously left AMA on the 24th but  did return later that day.  DVT prophylaxis: enoxaparin (LOVENOX) injection 40 mg Start: 09/18/22 1745 Code Status:   Code Status: Full Code Family Communication: None present  Status is: Inpatient  Dispo: The patient is from: Home              Anticipated d/c is to: Home in 24 to 48 hours  Consultants:  GI, ID, general surgery  Procedures:  Multiple cortrack placements, multiple paracentesis, EEG, abdominal drain placement Patient had pancreaticogastrostomy stent placed at [facility] 7/23 via ERCP and EUS  Antimicrobials:  Daptomycin PO vancomycin completed  Subjective: No acute issues or events overnight, patient admits to ongoing abdominal pain near drainage sites but denies fevers chills nausea vomiting headache shortness of breath or chest pain  Objective: Vitals:   09/20/22 1525 09/20/22 2119 09/20/22 2329 09/21/22 0512  BP: 129/74 131/73 128/75 136/78  Pulse: (!) 107 (!) 107 (!) 107 (!) 103  Resp: 16 16 16 16   Temp: 98.9 F (37.2 C) 98.9 F (37.2 C) 98.7 F (37.1 C) 99.1 F (37.3 C)  TempSrc: Oral Oral Oral Oral  SpO2: 95% 99% 97% 97%  Weight:      Height:        Intake/Output Summary (Last 24 hours) at 09/21/2022 0812 Last data filed at 09/21/2022 0602 Gross per 24 hour  Intake 184 ml  Output 1009 ml  Net -825 ml    Filed Weights   09/18/22 1520  Weight: 68.4 kg    Examination:  General:  Pleasantly resting in bed, No acute distress. HEENT:  Normocephalic atraumatic.  Sclerae nonicteric, noninjected.  Extraocular movements intact bilaterally. Neck:  Without mass or deformity.  Trachea is midline. Lungs:  Clear to auscultate bilaterally without rhonchi, wheeze, or rales. Heart:  Regular rate and rhythm.  Without murmurs, rubs, or gallops. Abdomen: Soft tender diffusely, right  lower and left lower quadrant drains draining clear yellow fluid. Extremities: Without cyanosis, clubbing, or obvious deformity. Skin:  Warm and dry, no erythema.    LOS: 3 days    Time spent:  Azucena Fallen, DO [facility]  If 7PM-7AM, please contact night-coverage [email or web address]  09/21/2022, 8:12 AM

## 2022-09-22 DIAGNOSIS — K652 Spontaneous bacterial peritonitis: Secondary | ICD-10-CM | POA: Diagnosis not present

## 2022-09-22 LAB — BASIC METABOLIC PANEL WITH GFR
Anion gap: 13 (ref 5–15)
BUN: <5 mg/dL — ABNORMAL LOW (ref 6–20)
CO2: 21 mmol/L — ABNORMAL LOW (ref 22–32)
Calcium: 8.2 mg/dL — ABNORMAL LOW (ref 8.9–10.3)
Chloride: 96 mmol/L — ABNORMAL LOW (ref 98–111)
Creatinine, Ser: 0.55 mg/dL — ABNORMAL LOW (ref 0.61–1.24)
GFR, Estimated: >60 mL/min (ref >60)
Glucose, Bld: 77 mg/dL (ref 70–99)
Potassium: 3 mmol/L — ABNORMAL LOW (ref 3.5–5.1)
Sodium: 130 mmol/L — ABNORMAL LOW (ref 135–145)

## 2022-09-22 LAB — CBC
HCT: 21.6 % — ABNORMAL LOW (ref 39.0–52.0)
Hemoglobin: 7.1 g/dL — ABNORMAL LOW (ref 13.0–17.0)
MCH: 27.1 pg (ref 26.0–34.0)
MCHC: 32.9 g/dL (ref 30.0–36.0)
MCV: 82.4 fL (ref 80.0–100.0)
Platelets: 858 10*3/uL — ABNORMAL HIGH (ref 150–400)
RBC: 2.62 MIL/uL — ABNORMAL LOW (ref 4.22–5.81)
RDW: 20.2 % — ABNORMAL HIGH (ref 11.5–15.5)
WBC: 16.6 10*3/uL — ABNORMAL HIGH (ref 4.0–10.5)
nRBC: 0 % (ref 0.0–0.2)

## 2022-09-22 MED ORDER — POTASSIUM CHLORIDE CRYS ER 20 MEQ PO TBCR
40.0000 meq | EXTENDED_RELEASE_TABLET | ORAL | Status: AC
  Administered 2022-09-22 (×2): 40 meq via ORAL
  Filled 2022-09-22 (×2): qty 2

## 2022-09-22 MED ORDER — SODIUM CHLORIDE 0.9 % IV SOLN
2.0000 g | Freq: Three times a day (TID) | INTRAVENOUS | Status: DC
  Administered 2022-09-22 – 2022-10-01 (×26): 2 g via INTRAVENOUS
  Filled 2022-09-22 (×28): qty 12.5

## 2022-09-22 NOTE — Progress Notes (Signed)
Pharmacy Antibiotic Note  Samuel Blevins is a 57 y.o. male admitted on 09/18/2022 with  intra-abdominal infection, ampC producing organism .  Pharmacy has been consulted for cefepime dosing.  Plan: Start Cefepime IV 2 grams every 8 hours Monitor WBC, temperature, and clinical picture  Height: 5\' 9"  (175.3 cm) Weight: 68.4 kg (150 lb 12.7 oz) IBW/kg (Calculated) : 70.7  Temp (24hrs), Avg:98.4 F (36.9 C), Min:98.1 F (36.7 C), Max:99.1 F (37.3 C)  Recent Labs  Lab 09/18/22 0712 09/18/22 1637 09/18/22 1744 09/19/22 0808 09/20/22 0940 09/22/22 0607  WBC 19.5* 22.5*  --  25.5* 28.2* 16.6*  CREATININE 0.96 1.18  --  0.85 0.66 0.55*  LATICACIDVEN  --   --  1.7  --   --   --     Estimated Creatinine Clearance: 97.4 mL/min (A) (by C-G formula based on SCr of 0.55 mg/dL (L)).    No Known Allergies  Antimicrobials this admission: Cefepime 7/1 >> 7/8 Zosyn 7/12 >> 7/14 Vancomycin PO (C diff) 7/12 >> 7.26  Daptomycin 7/26 >>  Cefepime 7/28 >>   Dose adjustments this admission:  Microbiology results: 7/1 BCx: NG final 7/1 peritoneal fluid cx: NG final  7/12 C diff +  7/12 peritoneal washing - VRE 7/24 Bcx: NGTD 7/25 wound cx: proteus mirabilis    Thank you for allowing pharmacy to be a part of this patient's care.  Griffin Dakin 09/22/2022 8:10 PM

## 2022-09-22 NOTE — Plan of Care (Signed)
  Problem: Education: Goal: Knowledge of General Education information will improve Description: Including pain rating scale, medication(s)/side effects and non-pharmacologic comfort measures Outcome: Progressing   Problem: Health Behavior/Discharge Planning: Goal: Ability to manage health-related needs will improve Outcome: Progressing   Problem: Activity: Goal: Risk for activity intolerance will decrease Outcome: Progressing   Problem: Clinical Measurements: Goal: Will remain free from infection Outcome: Not Progressing

## 2022-09-22 NOTE — Progress Notes (Signed)
AMA discharge note   Samuel Blevins  FIE:[id number] DOB: 1965/07/18 DOA: 09/18/2022 PCP: No primary care provider on file.   Brief Narrative:  57 year old with history of recurrent pancreatitis, chronic pancreatic insufficiency, HTN , recurrent alcohol use comes to the hospital with complaints of abdominal pain, nausea, vomiting and diarrhea.  Patient was also found to have acute recurrent alcoholic pancreatitis.  Initially patient had left AMA about 4-5 days prior to this admission.  Patient readmitted for acute recurrent alcoholic pancreatitis with worsening ascites, initial labs of ascites fluid indicate elevated amylase concerning for pancreatic duct disruption.    Hospital course complicated with presence of recurrent ascites, infected ascites fluid, ongoing abdominal pain and acute renal failure.  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 Being followed by GI, nephrology, and ID.   GI previously recommending transfer to outside facility for ERCP to attempt to stent pancreatic duct disruption. [facility] accepted patient for procedure on 09/17/2022.  Procedure was apparently difficult procedure with manipulation of the pancreas that ultimately had to be accessed via the gastric wall, stent was successfully placed as planned.  Patient's NG tube was instantly removed by staff during procedure then purposefully removed by patient post-anesthesia.  Patient left our facility AMA on the 24th due to "I have to go get my check" -we discussed the numerous risks of leaving hospital.  Fortunately patient did return to our facility later in the evening and was readmitted back to our service.  CT abdomen at intake did show pneumoperitoneum as well as diffuse loculated regions in the abdomen concerning for abscess, IR asked for drain placement.  Dr. Corliss Parish GI at [facility] confirms that pneumoperitoneum is expected given their  approach through the gastric wall.  Will advance diet as tolerated with ultimate plans for discharge home with ultimate outpatient follow-up at [facility] for pancreatic stenting, will discuss IR management of abdominal drain.  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 Corliss Parish.  Plan to repeat ERCP at their facility in 2 weeks to evaluate stent and likely replace with larger diameter stent. -Sepsis evidenced by tachycardia and leukocytosis - source: klebsiella ascites vs cdiff (both presumed POA). -Paracentesis 7/1 - 3L; 7/5 - 3L; 7/8 2L, 7/12 1.7 L. 7/21 600cc -CT 24/25th consistent with loculated abdominal fluid vs abscess -drain placed by IR -Multiple Cortrak's removed by patient -dislodged again intentionally 7/23 at [facility]; will keep NG out; advance diet as tolerated -Cultures(blood and paracentesis) initially negative however cultures on paracentesis from 7/12 shows vancomycin resistant enterococcus; drain growing proteus- on daptomycin/ceftriaxone per ID - GI recommending repeat paracentesis next week to check amylase levels and evidence of ongoing infection.  Improving C. difficile colitis:  PO vancomycin completed per discussion with ID today -Continue prophylactic vancomycin until completing antibiotic courses as above   Anemia of chronic disease:   Hemoglobin downtrend ongoing, 6.8 today -  patient has chart flagged for blood product refusal but he verbally agreed to blood and wants the flag removed. 1u PRBC to transfused successfully - hgb stable  Thrombocytosis -Questionably reactive in the setting of above infection/anemai.  Hypoalbuminemia Hypovolemic hyponatremia Hypokalemia -Secondary to above, improving with p.o. intake as NG tube has been removed we will not replace it -Advance diet as tolerated -soft diet to regular over the next 24 hours pending clinical status.  Recurrent alcohol induced pancreatitis. Nausea vomiting, resolved -Secondary to chronic alcohol use/abuse p.o. intake as tolerated, pain control avoiding IV narcotics, antiemetics, PPI   Severe protein caloric malnutrition High risk for refeeding syndrome -Complicated by chronic alcohol use  Alcohol abuse - Alcohol withdrawal protocol completed, currently outside the window for any further withdrawal symptoms(assuming patient did not have marked amounts of alcohol during the few hours he left AMA on the 24th).   No further need for benzos IV versus p.o.    Essential hypertension -Currently well-controlled off medications, as needed hydralazine for hypertensive events   GERD/duodenitis -Continue PPI   Acute kidney injury with hyperkalemia, resolved:  Initially followed by nephrology treated with [facility], urine output and labs normalizing   Acute hypoxic respiratory failure, transient and resolved:  Acute episode overnight on the night of 09/05/2022 Chest x-ray reported atelectasis, likely secondary to enlarging ascites. Improved s/p paracentesis  Noncompliance, profound, recurrent Acute metabolic/toxic encephalopathy, resolved -Multiple episodes of abnormal behavior previously, multiple episodes of Cortrak removal, nursing staff noted patient eating cigarettes last week.   -Remains alert and oriented, patient previously left AMA on the 24th but did  return later that day.  DVT prophylaxis: enoxaparin (LOVENOX) injection 40 mg Start: 09/18/22 1745 Code Status:   Code Status: Full Code Family Communication: None present  Status is: Inpatient  Dispo: The patient is from: Home              Anticipated d/c is to: Home in 24 to 48 hours  Consultants:  GI, ID, general surgery  Procedures:  Multiple cortrack placements, multiple paracentesis, EEG, abdominal drain placement Patient had pancreaticogastrostomy stent placed at [facility] 7/23 via ERCP and EUS  Antimicrobials:  Daptomycin PO vancomycin completed  Subjective: No acute issues or events overnight, pain currently well-controlled tolerating p.o. without difficulty denies nausea vomiting diarrhea constipation headache fevers chills or chest pain  Objective: Vitals:   09/21/22 1715 09/21/22 2024 09/22/22 0020 09/22/22 0442  BP: (!) 142/82 139/81 (!) 140/82 (!) 143/80  Pulse: 100 (!) 104 100 (!) 101  Resp: 18 20 20 18   Temp: 98.8 F (37.1 C) 99.1 F (37.3 C) 98.4 F (36.9 C) 98.1 F (36.7 C)  TempSrc: Oral Oral Oral Oral  SpO2: 98% 99% 98% 97%  Weight:      Height:        Intake/Output Summary (Last 24 hours) at 09/22/2022 0816 Last data filed at 09/22/2022 0500 Gross per 24 hour  Intake 194 ml  Output 930 ml  Net -736 ml    Filed Weights   09/18/22 1520  Weight: 68.4 kg    Examination:  General:  Pleasantly resting in bed, No acute distress. HEENT:  Normocephalic atraumatic.  Sclerae nonicteric, noninjected.  Extraocular movements intact bilaterally. Neck:  Without mass or deformity.  Trachea is midline. Lungs:  Clear to auscultate bilaterally without rhonchi, wheeze, or rales. Heart:  Regular rate and rhythm.  Without murmurs, rubs, or gallops. Abdomen: Soft tender diffusely, right lower and left lower quadrant drains draining clear yellow fluid. Extremities:  Without cyanosis, clubbing, or obvious deformity. Skin:  Warm and dry, no erythema.    LOS: 4 days    Time spent:  Azucena Fallen, DO [facility]  If 7PM-7AM, please contact night-coverage [email or web address]  09/22/2022, 8:16 AM

## 2022-09-23 DIAGNOSIS — K860 Alcohol-induced chronic pancreatitis: Secondary | ICD-10-CM | POA: Diagnosis not present

## 2022-09-23 DIAGNOSIS — K652 Spontaneous bacterial peritonitis: Secondary | ICD-10-CM

## 2022-09-23 DIAGNOSIS — A0471 Enterocolitis due to Clostridium difficile, recurrent: Secondary | ICD-10-CM | POA: Diagnosis not present

## 2022-09-23 NOTE — Progress Notes (Signed)
Referring Physician(s): Eliot Ford CCS   Supervising Physician: Gilmer Mor  Patient Status:  [facility] - In-pt  Chief Complaint:  Acute recurrent alcoholic pancreatitis  CT showed RUQ and LLQ fluid collections  S/p aspiration and drain placement by Dr. Milford Cage on 09/19/22   Subjective:  Patient laying in bed, NAD.  States that he had OK weekend.  Asks when repeat CT will be obtained, informed the patient that it is most likely Wednesday or later due to output over 10 mL/day.   Allergies: Patient has no known allergies.  Medications: Prior to Admission medications   Medication Sig Start Date End Date Taking? Authorizing Provider  amLODipine (NORVASC) 5 MG tablet Take 5 mg by mouth daily.   Yes [provider]  carvedilol (COREG) 3.125 MG tablet Take 1 tablet (3.125 mg total) by mouth 2 (two) times daily with a meal. 07/10/22  Yes [name], MD  folic acid (FOLVITE) 1 MG tablet Take 1 tablet (1 mg total) by mouth daily. 07/14/22  Yes Rolly Salter, MD  hydrOXYzine (ATARAX) 25 MG tablet Take 25 mg by mouth at bedtime as needed for anxiety.   Yes [provider]  Iron, Ferrous Sulfate, 325 (65 Fe) MG TABS Take 1 tablet ( 325 mg) by mouth daily. 05/24/22  Yes [name], MD  lipase/protease/amylase (CREON) 12000-38000 units CPEP capsule Take 1 capsule (12,000 Units total) by mouth 3 (three) times daily before meals. 07/14/22  Yes Rolly Salter, MD  magnesium oxide (MAG-OX) 400 MG tablet Take 1 tablet (400 mg total) by mouth in the morning and at bedtime. 07/14/22  Yes Rolly Salter, MD  Multiple Vitamin (MULTIVITAMIN WITH MINERALS) TABS tablet Take 1 tablet by mouth daily. 07/14/22  Yes Rolly Salter, MD  nicotine (NICODERM CQ - DOSED IN MG/24 HOURS) 14 mg/24hr patch Place 1 patch (14 mg total) onto the skin daily. 07/14/22  Yes Rolly Salter, MD  ondansetron (ZOFRAN) 8 MG tablet Take 8 mg by mouth every 6 (six) hours as needed for nausea or vomiting.    Yes [provider]  pantoprazole (PROTONIX) 40 MG tablet Take 1 tablet (40 mg total) by mouth 2 (two) times daily before a meal. 07/14/22  Yes Rolly Salter, MD  polyethylene glycol (MIRALAX / GLYCOLAX) 17 g packet Take 17 g by mouth daily as needed for mild constipation or moderate constipation. 07/14/22  Yes Rolly Salter, MD  sucralfate (CARAFATE) 1 g tablet Take 1 g by mouth See admin instructions. Take 1 tablet by mouth 4 times daily with meals and bedtime   Yes [provider]  thiamine (VITAMIN B-1) 100 MG tablet Take 1 tablet (100 mg total) by mouth daily. 07/14/22  Yes Rolly Salter, MD  traMADol (ULTRAM) 50 MG tablet Take 1 tablet (50 mg total) by mouth every 8 (eight) hours as needed for severe pain. Patient not taking: Reported on 09/18/2022 07/17/22   [name], MD     Vital Signs: BP (!) 146/84 (BP Location: Left Arm)   Pulse (!) 107   Temp 97.9 F (36.6 C) (Oral)   Resp 18   Ht 5\' 9"  (1.753 m)   Wt 150 lb 12.7 oz (68.4 kg)   SpO2 98%   BMI 22.27 kg/m   Physical Exam Vitals reviewed.  Constitutional:      General: He is not in acute distress.    Appearance: He is not ill-appearing.  HENT:  Head: Normocephalic.  Eyes:     Extraocular Movements: Extraocular movements intact.  Pulmonary:     Effort: Pulmonary effort is normal.  Abdominal:     General: Abdomen is flat.     Palpations: Abdomen is soft.     Comments: Positive RLQ drain to a suction bulb. Site is unremarkable with no erythema, edema, tenderness, bleeding or drainage. Suture and stat lock in place. Dressing is clean, dry, and intact. ~10 ml of  purulent, tan colored fluid noted in the bulb. Fluid is more clear. Drain aspirates and flushes well.   Positive LLQ drain to a suction bulb. Site is unremarkable with no erythema, edema, tenderness, bleeding or drainage. Suture and stat lock in place. Dressing is clean, dry, and intact. ~5 ml of  simple serous fluid in the bulb.  Drain aspirates and flushes well.     Musculoskeletal:     Cervical back: Neck supple.  Neurological:     Mental Status: He is alert.  Psychiatric:        Mood and Affect: Mood normal.        Behavior: Behavior normal.     Imaging: CT GUIDED PERITONEAL/RETROPERITONEAL FLUID DRAIN BY PERC CATH  Result Date: 09/19/2022 INDICATION: [id number] Abscess [id number] EXAM: CT-GUIDED PERCUTANEOUS ABSCESS DRAINAGE CATHETER PLACEMENT AT RIGHT UPPER QUADRANT AND LEFT LOWER QUADRANTS COMPARISON:  CT AP, 09/18/2022 MEDICATIONS: The patient is currently admitted to the hospital and receiving intravenous antibiotics. The antibiotics were administered within an appropriate time frame prior to the initiation of the procedure. ANESTHESIA/SEDATION: Moderate (conscious) sedation was employed during this procedure. A total of Versed 2 mg and Fentanyl 75 mcg was administered intravenously. Moderate Sedation Time: 45 minutes. The patient's level of consciousness and vital signs were monitored continuously by radiology nursing throughout the procedure under my direct supervision. CONTRAST:  None COMPLICATIONS: None immediate. PROCEDURE: RADIATION DOSE REDUCTION: This exam was performed according to the departmental dose-optimization program which includes automated exposure control, adjustment of the mA and/or kV according to patient size and/or use of iterative reconstruction technique. Informed written consent was obtained from the patient and/or patient's representative after a discussion of the risks, benefits and alternatives to treatment. The patient was placed supine on the CT gantry and a pre procedural CT was performed re-demonstrating the known abscess/fluid collection within the RIGHT upper and LEFT lower quadrants. The procedure was planned. A timeout was performed prior to the initiation of the procedure. The RIGHT and LEFT abdomen was prepped and draped in the usual sterile fashion. The procedure began at the patient's  RIGHT upper quadrant. The overlying soft tissues were anesthetized with 1% lidocaine with epinephrine. Appropriate trajectory was planned with the use of a 22 gauge spinal needle. An 18 gauge trocar needle was advanced into the abscess/fluid collection and a short Amplatz super stiff wire was coiled within the collection. Appropriate positioning was confirmed with a limited CT scan. The tract was serially dilated allowing placement of a 12 Fr drainage catheter. The procedure was repeated at the patient's LEFT lower quadrant, allowing placement of a 12 Fr drainage catheter. Appropriate positioning was confirmed with a limited postprocedural CT scan. A representative sample of 5 ml of purulent fluid was aspirated. The tubes were connected to a bulb suction and sutured in place. A dressing was placed. The patient tolerated the procedure well without immediate post procedural complication. IMPRESSION: Successful CT guided placement of a 12 Fr drainage catheters into RIGHT upper and LEFT lower quadrant abscesses. Aspiration of 5  mL sample of purulent fluid. Samples were sent to the laboratory as requested by the ordering clinical team Roanna Banning, MD [facility] Radiology Electronically Signed   By: Roanna Banning M.D.   On: 09/19/2022 17:32   CT GUIDED PERITONEAL/RETROPERITONEAL FLUID DRAIN BY PERC CATH  Result Date: 09/19/2022 INDICATION: [id number] Abscess [id number] EXAM: CT-GUIDED PERCUTANEOUS ABSCESS DRAINAGE CATHETER PLACEMENT AT RIGHT UPPER QUADRANT AND LEFT LOWER QUADRANTS COMPARISON:  CT AP, 09/18/2022 MEDICATIONS: The patient is currently admitted to the hospital and receiving intravenous antibiotics. The antibiotics were administered within an appropriate time frame prior to the initiation of the procedure. ANESTHESIA/SEDATION: Moderate (conscious) sedation was employed during this procedure. A total of Versed 2 mg and Fentanyl 75 mcg was administered intravenously.  Moderate Sedation Time: 45 minutes. The patient's level of consciousness and vital signs were monitored continuously by radiology nursing throughout the procedure under my direct supervision. CONTRAST:  None COMPLICATIONS: None immediate. PROCEDURE: RADIATION DOSE REDUCTION: This exam was performed according to the departmental dose-optimization program which includes automated exposure control, adjustment of the mA and/or kV according to patient size and/or use of iterative reconstruction technique. Informed written consent was obtained from the patient and/or patient's representative after a discussion of the risks, benefits and alternatives to treatment. The patient was placed supine on the CT gantry and a pre procedural CT was performed re-demonstrating the known abscess/fluid collection within the RIGHT upper and LEFT lower quadrants. The procedure was planned. A timeout was performed prior to the initiation of the procedure. The RIGHT and LEFT abdomen was prepped and draped in the usual sterile fashion. The procedure began at the patient's RIGHT upper quadrant. The overlying soft tissues were anesthetized with 1% lidocaine with epinephrine. Appropriate trajectory was planned with the use of a 22 gauge spinal needle. An 18 gauge trocar needle was advanced into the abscess/fluid collection and a short Amplatz super stiff wire was coiled within the collection. Appropriate positioning was confirmed with a limited CT scan. The tract was serially dilated allowing placement of a 12 Fr drainage catheter. The procedure was repeated at the patient's LEFT lower quadrant, allowing placement of a 12 Fr drainage catheter. Appropriate positioning was confirmed with a limited postprocedural CT scan. A representative sample of 5 ml of purulent fluid was aspirated. The tubes were connected to a bulb suction and sutured in place. A dressing was placed. The patient tolerated the procedure well without immediate post procedural  complication. IMPRESSION: Successful CT guided placement of a 12 Fr drainage catheters into RIGHT upper and LEFT lower quadrant abscesses. Aspiration of 5 mL sample of purulent fluid. Samples were sent to the laboratory as requested by the ordering clinical team Roanna Banning, MD [facility] Radiology Electronically Signed   By: Roanna Banning M.D.   On: 09/19/2022 17:32    Labs:  CBC: Recent Labs    09/18/22 1637 09/19/22 0808 09/20/22 0940 09/22/22 0607  WBC 22.5* 25.5* 28.2* 16.6*  HGB 7.4* 7.4* 7.1* 7.1*  HCT 22.5* 22.2* 21.5* 21.6*  PLT 997* 871* 807* 858*    COAGS: Recent Labs    06/13/22 2310 08/17/22 2040 08/20/22 1200 08/26/22 1002 09/17/22 0617 09/18/22 0712 09/18/22 1637 09/19/22 0808  INR 1.1 1.1   < > 1.2 1.2 1.2 1.2 1.3*  APTT 37* 36  --  34  --   --  35  --    < > = values in this interval not displayed.  BMP: Recent Labs    09/18/22 1637 09/19/22 0808 09/20/22 0940 09/22/22 0607  NA 133* 131* 130* 130*  K 4.1 3.7 3.3* 3.0*  CL 99 99 96* 96*  CO2 22 22 21* 21*  GLUCOSE 118* 101* 89 77  BUN 16 11 6  <5*  CALCIUM 8.7* 8.6* 8.2* 8.2*  CREATININE 1.18 0.85 0.66 0.55*  GFRNONAA >60 >60 >60 >60    LIVER FUNCTION TESTS: Recent Labs    09/17/22 0617 09/18/22 0712 09/18/22 1637 09/19/22 0808  BILITOT 0.6 0.6 0.5 0.9  AST 61* 76* 60* 35  ALT 40 57* 53* 35  ALKPHOS 156* 136* 134* 127*  PROT 8.2* 8.2* 8.6* 7.5  ALBUMIN 2.3* 2.5* 2.7* 2.2*    Assessment and Plan:  57 y.o. male with recurrent pancreatitis, chronic pancreatic insufficiency, HTN , recurrent ascites s/p multiple para, admitted on 7/24 due to acute recurrent alcoholic pancreatitis,  CT showed RUQ and LLQ fluid collections, s/p RUQ and LLQ drain placement by Dr. Milford Cage on 09/19/22.   CBC yesterday showed trending down CBC VSS - has been tachycardic, afebrile  Cx from right subhepatic fluid grew proteus mirabilis   Output this morning   - RLQ: 20 mL appears tan but clear than last assessment on 7/26 - LLQ: 20 mL appears simple serous   Drain Location: RLQ/LLQ Size: Fr size: 12 Fr Date of placement: 7/25  Currently to: Drain collection device: suction bulb 24 hour output:  Output by Drain (mL) 09/21/22 0701 - 09/21/22 1900 09/21/22 1901 - 09/22/22 0700 09/22/22 0701 - 09/22/22 1900 09/22/22 1901 - 09/23/22 0700 09/23/22 0701 - 09/23/22 1319  Closed System Drain 2 Anterior;Lateral Abdomen Bulb (JP) 12 Fr. 5 5 10 10    Closed System Drain 1 Lateral RUQ Bulb (JP) 12 Fr. 10 10 10 10      Interval imaging/drain manipulation:  None   Current examination: Flushes/aspirates easily.  Insertion site unremarkable. Suture and stat lock in place. Dressed appropriately.   Plan: Continue TID flushes with 5 cc NS. Record output Q shift. Dressing changes QD or PRN if soiled.  Call IR APP or on call IR MD if difficulty flushing or sudden change in drain output.  Repeat imaging/possible drain injection once output < 10 mL/QD (excluding flush material). Consideration for drain removal if output is < 10 mL/QD (excluding flush material), pending discussion with the providing surgical service.  Discharge planning: Patient is homeless, will most likely lost in follow up and cannot be discharged safely with the drain in place.  LLQ drain with simple serous fluid, this most likely ascites.  Discussed with Dr. Fredia Sorrow, will need to obtain CT A/P with IV cont before we can remove the LLQ drain.  IR will follow the patient closely and will attempt to remove the drain asap.  Please contact IR APP or on call IR MD prior to patient d/c to ensure appropriate follow up plans are in place. Typically patient will follow up with IR clinic 10-14 days post d/c for repeat imaging/possible drain injection. IR scheduler will contact patient with date/time of appointment. Patient will need to flush drain QD with 5 cc NS, record output QD, dressing changes  every 2-3 days or earlier if soiled.   IR will continue to follow - please call with questions or concerns.   Electronically Signed: Willette Brace, PA-C 09/23/2022, 1:19 PM   I spent a total of 15 Minutes at the the patient's bedside AND on the patient's hospital floor or unit, greater  than 50% of which was counseling/coordinating care for drain f/u.   This chart was dictated using voice recognition software.  Despite best efforts to proofread,  errors can occur which can change the documentation meaning.

## 2022-09-23 NOTE — Progress Notes (Signed)
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   Chronic pain syndrome   Chronic alcohol abuse   Pancreatic insufficiency   Alcohol-induced chronic pancreatitis (HCC)   Acute on chronic pancreatitis (HCC)   Pancreatic duct disruption   Enteritis due to Clostridium difficile   Abscess of abdominal cavity (HCC)  Current antibiotics Cefepime Daptomycin Total days of antibiotics 18  Lines/Hardwares:  Interval Events: Remains afebrile, IV ceftriaxone changed to IV cefepime yesterday given bacteremia isolated from abdominal cultures   Assessment 59 Y O male with history of recurrent alcoholic pancreatitis, chronic pancreatic insufficiency, HTN who presented 7/24 with nausea, vomiting and abdominal pain with diarrhea. Patient had left AMA earlier same day after hospital course  remarkable for recurrent ascites, SBP, ongoing abdomen pain, acute renal failure.There was concern for pancreatic duct disruption causing recurrent amyloid positive ascites, SBP. He was started on Daptomycin for VRE growing from periotoneal fluid cx on 7/12 ( Ascitic fluid WBC 16,390, neutrophilic). He was briefly transferred to [facility] for stent placement for biliary duct on 09/17/2022.  Procedure was apparently difficult and required prolonged procedure with difficult manipulation of the pancreas but stent was successfully placed as planned and he was transferred back. There is plan for repeat ERCP in the next 2 weeks at [facility]. He was also found to have C. difficile and was treated with PO Vancomycin.     # Infected  Pancreatic ascites/SBP in the setting of liver cirrhosis with chronic pancreatitis w pancreatic duct disruption  GI and General surgery following  7/25 CT Moderate ascites in the abdomen and pelvis. Multiple focal fluid collections are noted along the greater curvature of the stomach, inferior to the right hepatic lobe, and in the left mid to lower abdomen. Pneumoperitoneum again noted, stable. 7/25 drain placement by IR.  Cultures polymicrobial with VRE, Proteus mirabilis, Enterobacter aerogenes  # Recent C diff colitis  On prophylactic PO Vancomycin   Recommendations Continue Daptomycin and cefepime Fu IR aspirate cx to  completion  Plan for repeat paracentesis by GI this week for amylase as well as for evidence of ongoing infection noted.  Monitor CBC, CMP and CPK  Drain management per IR Following intermittently this week   Rest of the management as per the primary team. Thank you for the consult. Please page with pertinent questions or concerns.  ______________________________________________________________________ Subjective patient seen and examined at the bedside.  Complains of some nausea but otherwise no new complaints  Past Medical History:  Diagnosis Date   Alcohol withdrawal syndrome with complication (HCC)    Alcoholism (HCC)    Elevated AST (SGOT)    Gastrointestinal hemorrhage    Homeless    Hypertension    Pancreatic insufficiency    takes Creon   Symptomatic anemia 11/23/2015   Thrombocytopenia (HCC) 05/09/2021   Past Surgical History:  Procedure Laterality Date   BIOPSY  06/23/2019   Procedure: BIOPSY;  Surgeon: Shellia Cleverly, DO;  Location: [facility] ENDOSCOPY;  Service: Gastroenterology;;   BIOPSY  12/12/2020   Procedure: BIOPSY;  Surgeon: Lynann Bologna, MD;  Location: [facility] ENDOSCOPY;  Service: Endoscopy;;   BIOPSY  08/04/2021   Procedure: BIOPSY;  Surgeon: Jenel Lucks, MD;  Location:  [facility] ENDOSCOPY;  Service: Gastroenterology;;    ESOPHAGOGASTRODUODENOSCOPY N/A 08/04/2021   Procedure: ESOPHAGOGASTRODUODENOSCOPY (EGD);  Surgeon: Jenel Lucks, MD;  Location: [facility] ENDOSCOPY;  Service: Gastroenterology;  Laterality: N/A;   ESOPHAGOGASTRODUODENOSCOPY (EGD) WITH PROPOFOL N/A 06/23/2019   Procedure: ESOPHAGOGASTRODUODENOSCOPY (EGD) WITH PROPOFOL;  Surgeon: Shellia Cleverly, DO;  Location: [facility] ENDOSCOPY;  Service: Gastroenterology;  Laterality: N/A;   ESOPHAGOGASTRODUODENOSCOPY (EGD) WITH PROPOFOL N/A 12/12/2020   Procedure: ESOPHAGOGASTRODUODENOSCOPY (EGD) WITH PROPOFOL;  Surgeon: Lynann Bologna, MD;  Location[facility] ENDOSCOPY;  Service: Endoscopy;  Laterality: N/A;   HOT HEMOSTASIS N/A 06/23/2019   Procedure: HOT HEMOSTASIS (ARGON PLASMA COAGULATION/BICAP);  Surgeon: Shellia Cleverly, DO;  Location: [facility] ENDOSCOPY;  Service: Gastroenterology;  Laterality: N/A;   IR PARACENTESIS  08/21/2022   IR PARACENTESIS  08/26/2022   IR PARACENTESIS  08/30/2022   IR PARACENTESIS  09/02/2022   IR PARACENTESIS  09/06/2022    Vitals BP (!) 142/78 (BP Location: Left Arm)   Pulse (!) 117   Temp (!) 97.5 F (36.4 C) (Oral)   Resp 18   Ht 5\' 9"  (1.753 m)   Wt 68.4 kg   SpO2 98%   BMI 22.27 kg/m     Physical Exam Constitutional: Adult male sitting in the bed and appears comfortable    Comments: Poor dental hygiene with dental caries  Cardiovascular:     Rate and Rhythm: Normal rate and regular rhythm.     Heart sounds: s1s2  Pulmonary:     Effort: Pulmonary effort is normal on room air     Comments: Normal breath sounds  Abdominal:     Palpations: Abdomen is soft.     Tenderness: Mildly distended and mild generalized tenderness, 2 drains in each site with minimal clear fluid in the bulb  Musculoskeletal:        General: No swelling or tenderness in peripheral joints Skin:    Comments:   Neurological:     General: Awake, alert and oriented, following commands  Psychiatric:        Mood and Affect: Mood normal.   Pertinent  Microbiology Results for orders placed or performed during the hospital encounter of 09/18/22  Resp panel by RT-PCR (RSV, Flu A&B, Covid) Anterior Nasal Swab     Status: None   Collection Time: 09/18/22  3:54 PM   Specimen: Anterior Nasal Swab  Result Value Ref Range Status   SARS Coronavirus 2 by RT PCR NEGATIVE NEGATIVE Final   Influenza A by PCR NEGATIVE NEGATIVE Final   Influenza B by PCR NEGATIVE NEGATIVE Final    Comment: (NOTE) The Xpert Xpress SARS-CoV-2/FLU/RSV plus assay is intended as an aid in the diagnosis of influenza from Nasopharyngeal swab specimens and should not be used as a sole basis for treatment. Nasal washings and aspirates are unacceptable for Xpert Xpress SARS-CoV-2/FLU/RSV testing.  Fact Sheet for Patients: BloggerCourse.com  Fact Sheet for Healthcare Providers: SeriousBroker.it  This test is not yet approved or cleared by the [place] FDA and has been authorized for detection and/or diagnosis of SARS-CoV-2 by FDA under an Emergency Use Authorization (EUA). This EUA will remain in effect (meaning this test can be used) for the duration of the COVID-19 declaration under Section 564(b)(1) of the Act, 21 U.S.C. section 360bbb-3(b)(1), unless the authorization is terminated or revoked.     Resp Syncytial Virus by PCR NEGATIVE NEGATIVE Final    Comment: (NOTE) Fact Sheet for Patients: BloggerCourse.com  Fact Sheet for Healthcare Providers: SeriousBroker.it  This test is  not yet approved or cleared by the [place] and has been authorized for detection and/or diagnosis of SARS-CoV-2 by FDA under an Emergency Use Authorization (EUA). This EUA will remain in effect (meaning this test can be used) for the duration of the COVID-19 declaration under Section 564(b)(1) of the Act, 21 U.S.C. section 360bbb-3(b)(1), unless the authorization is  terminated or revoked.  Performed at [facility], [address], [place], [place] [address]   Blood Culture (routine x 2)     Status: None (Preliminary result)   Collection Time: 09/18/22  4:37 PM   Specimen: BLOOD LEFT HAND  Result Value Ref Range Status   Specimen Description BLOOD LEFT HAND  Final   Special Requests   Final    BOTTLES DRAWN AEROBIC AND ANAEROBIC Blood Culture adequate volume   Culture   Final    NO GROWTH 4 DAYS Performed at [facility], [address], [place], [place] [address]    Report Status PENDING  Incomplete  Blood Culture (routine x 2)     Status: None (Preliminary result)   Collection Time: 09/18/22  4:37 PM   Specimen: BLOOD RIGHT HAND  Result Value Ref Range Status   Specimen Description BLOOD RIGHT HAND  Final   Special Requests   Final    BOTTLES DRAWN AEROBIC AND ANAEROBIC Blood Culture adequate volume   Culture   Final    NO GROWTH 4 DAYS Performed at [facility], [address] [place], [place] [address]    Report Status PENDING  Incomplete  Aerobic/Anaerobic Culture w Gram Stain (surgical/deep wound)     Status: None (Preliminary result)   Collection Time: 09/19/22  1:23 PM   Specimen: Abscess  Result Value Ref Range Status   Specimen Description ABSCESS ABDOMEN RIGHT  Final   Special Requests DRAIN  Final   Gram Stain   Final    ABUNDANT WBC PRESENT, PREDOMINANTLY PMN MODERATE GRAM NEGATIVE RODS    Culture   Final    ABUNDANT PROTEUS MIRABILIS RARE ENTEROBACTER AEROGENES FEW ENTEROCOCCUS FAECIUM VANCOMYCIN RESISTANT ENTEROCOCCUS NO ANAEROBES ISOLATED; CULTURE IN PROGRESS FOR 5 DAYS CONFIRMATION OF SUSCEPTIBILITIES IN PROGRESS FOR ENTEROBACTER AEROGENES Performed at [facility], [address] [place], [place] [address]    Report Status PENDING  Incomplete   Organism ID, Bacteria PROTEUS MIRABILIS  Final   Organism ID, Bacteria ENTEROCOCCUS FAECIUM  Final      Susceptibility   Enterococcus  faecium - MIC*    AMPICILLIN >=32 RESISTANT Resistant     VANCOMYCIN >=32 RESISTANT Resistant     GENTAMICIN SYNERGY SENSITIVE Sensitive     * FEW ENTEROCOCCUS FAECIUM   Proteus mirabilis - MIC*    AMPICILLIN <=2 SENSITIVE Sensitive     CEFEPIME <=0.12 SENSITIVE Sensitive     CEFTAZIDIME <=1 SENSITIVE Sensitive     CEFTRIAXONE <=0.25 SENSITIVE Sensitive     CIPROFLOXACIN <=0.25 SENSITIVE Sensitive     GENTAMICIN <=1 SENSITIVE Sensitive     IMIPENEM 4 SENSITIVE Sensitive     TRIMETH/SULFA <=20 SENSITIVE Sensitive     AMPICILLIN/SULBACTAM <=2 SENSITIVE Sensitive     PIP/TAZO <=4 SENSITIVE Sensitive     * ABUNDANT PROTEUS MIRABILIS    Pertinent Lab.    Latest Ref Rng & Units 09/22/2022    6:07 AM 09/20/2022    9:40 AM 09/19/2022    8:08 AM  CBC  WBC 4.0 - 10.5 K/uL 16.6  28.2  25.5  Hemoglobin 13.0 - 17.0 g/dL 7.1  7.1  7.4   Hematocrit 39.0 - 52.0 % 21.6  21.5  22.2   Platelets 150 - 400 K/uL 858  807  871       Latest Ref Rng & Units 09/22/2022    6:07 AM 09/20/2022    9:40 AM 09/19/2022    8:08 AM  CMP  Glucose 70 - 99 mg/dL 77  89  098   BUN 6 - 20 mg/dL 5  6  11    Creatinine 0.61 - 1.24 mg/dL 1.19  1.47  8.29   Sodium 135 - 145 mmol/L 130  130  131   Potassium 3.5 - 5.1 mmol/L 3.0  3.3  3.7   Chloride 98 - 111 mmol/L 96  96  99   CO2 22 - 32 mmol/L 21  21  22    Calcium 8.9 - 10.3 mg/dL 8.2  8.2  8.6   Total Protein 6.5 - 8.1 g/dL   7.5   Total Bilirubin 0.3 - 1.2 mg/dL   0.9   Alkaline Phos 38 - 126 U/L   127   AST 15 - 41 U/L   35   ALT 0 - 44 U/L   35      Pertinent Imaging today Plain films and CT images have been personally visualized and interpreted; radiology reports have been reviewed. Decision making incorporated into the Impression /   CT GUIDED PERITONEAL/RETROPERITONEAL FLUID DRAIN BY PERC CATH  Result Date: 09/19/2022 INDICATION: [id number] Abscess [id number] EXAM: CT-GUIDED PERCUTANEOUS ABSCESS DRAINAGE CATHETER PLACEMENT AT RIGHT UPPER QUADRANT AND LEFT  LOWER QUADRANTS COMPARISON:  CT AP, 09/18/2022 MEDICATIONS: The patient is currently admitted to the hospital and receiving intravenous antibiotics. The antibiotics were administered within an appropriate time frame prior to the initiation of the procedure. ANESTHESIA/SEDATION: Moderate (conscious) sedation was employed during this procedure. A total of Versed 2 mg and Fentanyl 75 mcg was administered intravenously. Moderate Sedation Time: 45 minutes. The patient's level of consciousness and vital signs were monitored continuously by radiology nursing throughout the procedure under my direct supervision. CONTRAST:  None COMPLICATIONS: None immediate. PROCEDURE: RADIATION DOSE REDUCTION: This exam was performed according to the departmental dose-optimization program which includes automated exposure control, adjustment of the mA and/or kV according to patient size and/or use of iterative reconstruction technique. Informed written consent was obtained from the patient and/or patient's representative after a discussion of the risks, benefits and alternatives to treatment. The patient was placed supine on the CT gantry and a pre procedural CT was performed re-demonstrating the known abscess/fluid collection within the RIGHT upper and LEFT lower quadrants. The procedure was planned. A timeout was performed prior to the initiation of the procedure. The RIGHT and LEFT abdomen was prepped and draped in the usual sterile fashion. The procedure began at the patient's RIGHT upper quadrant. The overlying soft tissues were anesthetized with 1% lidocaine with epinephrine. Appropriate trajectory was planned with the use of a 22 gauge spinal needle. An 18 gauge trocar needle was advanced into the abscess/fluid collection and a short Amplatz super stiff wire was coiled within the collection. Appropriate positioning was confirmed with a limited CT scan. The tract was serially dilated allowing placement of a 12 Fr drainage catheter.  The procedure was repeated at the patient's LEFT lower quadrant, allowing placement of a 12 Fr drainage catheter. Appropriate positioning was confirmed with a limited postprocedural CT scan. A representative sample of 5 ml of purulent fluid was aspirated. The  tubes were connected to a bulb suction and sutured in place. A dressing was placed. The patient tolerated the procedure well without immediate post procedural complication. IMPRESSION: Successful CT guided placement of a 12 Fr drainage catheters into RIGHT upper and LEFT lower quadrant abscesses. Aspiration of 5 mL sample of purulent fluid. Samples were sent to the laboratory as requested by the ordering clinical team Roanna Banning, MD [facility] Radiology Electronically Signed   By: Roanna Banning M.D.   On: 09/19/2022 17:32   CT GUIDED PERITONEAL/RETROPERITONEAL FLUID DRAIN BY PERC CATH  Result Date: 09/19/2022 INDICATION: [id number] Abscess [id number] EXAM: CT-GUIDED PERCUTANEOUS ABSCESS DRAINAGE CATHETER PLACEMENT AT RIGHT UPPER QUADRANT AND LEFT LOWER QUADRANTS COMPARISON:  CT AP, 09/18/2022 MEDICATIONS: The patient is currently admitted to the hospital and receiving intravenous antibiotics. The antibiotics were administered within an appropriate time frame prior to the initiation of the procedure. ANESTHESIA/SEDATION: Moderate (conscious) sedation was employed during this procedure. A total of Versed 2 mg and Fentanyl 75 mcg was administered intravenously. Moderate Sedation Time: 45 minutes. The patient's level of consciousness and vital signs were monitored continuously by radiology nursing throughout the procedure under my direct supervision. CONTRAST:  None COMPLICATIONS: None immediate. PROCEDURE: RADIATION DOSE REDUCTION: This exam was performed according to the departmental dose-optimization program which includes automated exposure control, adjustment of the mA and/or kV according to patient size and/or use  of iterative reconstruction technique. Informed written consent was obtained from the patient and/or patient's representative after a discussion of the risks, benefits and alternatives to treatment. The patient was placed supine on the CT gantry and a pre procedural CT was performed re-demonstrating the known abscess/fluid collection within the RIGHT upper and LEFT lower quadrants. The procedure was planned. A timeout was performed prior to the initiation of the procedure. The RIGHT and LEFT abdomen was prepped and draped in the usual sterile fashion. The procedure began at the patient's RIGHT upper quadrant. The overlying soft tissues were anesthetized with 1% lidocaine with epinephrine. Appropriate trajectory was planned with the use of a 22 gauge spinal needle. An 18 gauge trocar needle was advanced into the abscess/fluid collection and a short Amplatz super stiff wire was coiled within the collection. Appropriate positioning was confirmed with a limited CT scan. The tract was serially dilated allowing placement of a 12 Fr drainage catheter. The procedure was repeated at the patient's LEFT lower quadrant, allowing placement of a 12 Fr drainage catheter. Appropriate positioning was confirmed with a limited postprocedural CT scan. A representative sample of 5 ml of purulent fluid was aspirated. The tubes were connected to a bulb suction and sutured in place. A dressing was placed. The patient tolerated the procedure well without immediate post procedural complication. IMPRESSION: Successful CT guided placement of a 12 Fr drainage catheters into RIGHT upper and LEFT lower quadrant abscesses. Aspiration of 5 mL sample of purulent fluid. Samples were sent to the laboratory as requested by the ordering clinical team Roanna Banning, MD [facility] Radiology Electronically Signed   By: Roanna Banning M.D.   On: 09/19/2022 17:32   CT ABDOMEN PELVIS WO CONTRAST  Result Date:  09/19/2022 CLINICAL DATA:  evaluate for perforated viscous EXAM: CT ABDOMEN AND PELVIS WITHOUT CONTRAST TECHNIQUE: Multidetector CT imaging of the abdomen and pelvis was performed following the standard protocol without IV contrast. RADIATION DOSE REDUCTION: This exam was performed according to the departmental dose-optimization program which includes automated exposure control, adjustment of the  mA and/or kV according to patient size and/or use of iterative reconstruction technique. COMPARISON:  09/18/2022.  08/26/2022. FINDINGS: Lower chest: Moderate right pleural effusion. Right lower lobe and right middle lobe airspace disease could reflect atelectasis or infiltrate/pneumonia. Coronary artery and aortic calcifications. Hepatobiliary: No focal hepatic abnormality. Gallbladder unremarkable. Pancreas: Pancreatic ductal stent in place terminating in the stomach. No visible focal pancreatic abnormality. No surrounding inflammation. Spleen: No focal abnormality.  Normal size. Adrenals/Urinary Tract: No adrenal abnormality. No focal renal abnormality. No stones or hydronephrosis. Urinary bladder is unremarkable. Stomach/Bowel: Stomach, large and small bowel grossly unremarkable. Vascular/Lymphatic: Aortic atherosclerosis. No evidence of aneurysm or adenopathy. Reproductive: No visible focal abnormality. Other: Moderate ascites in the abdomen and pelvis. Multiple focal fluid collections are noted along the greater curvature of the stomach, inferior to the right hepatic lobe, and in the left mid to lower abdomen. Pneumoperitoneum again noted, stable. Musculoskeletal: No acute bony abnormality. IMPRESSION: Continued pneumoperitoneum. No obvious source of the free air noted. No abnormal bowel. Therefore, this is favored to be related to recent paracentesis. Moderate ascites. Multiple loculated fluid collections as described above. These could reflect loculated ascites, less likely abscess is. The sub a pathic fluid  collection or the left lower anterior abdominal fluid collection would be amenable to sampling if felt clinically relevant. Small to moderate right pleural effusion. Compressive atelectasis versus pneumonia in the right middle lobe and right lower lobe. Aortoiliac atherosclerosis. Electronically Signed   By: Charlett Nose M.D.   On: 09/19/2022 01:56   CT ABDOMEN PELVIS W CONTRAST  Result Date: 09/18/2022 CLINICAL DATA:  Abdominal pain EXAM: CT ABDOMEN AND PELVIS WITH CONTRAST TECHNIQUE: Multidetector CT imaging of the abdomen and pelvis was performed using the standard protocol following bolus administration of intravenous contrast. RADIATION DOSE REDUCTION: This exam was performed according to the departmental dose-optimization program which includes automated exposure control, adjustment of the mA and/or kV according to patient size and/or use of iterative reconstruction technique. CONTRAST:  75mL OMNIPAQUE IOHEXOL 350 MG/ML SOLN COMPARISON:  08/26/2022 FINDINGS: Lower chest: There is interval increase in size of right pleural effusion. There is infiltrate with air bronchograms in right lower lung field suggesting worsening of atelectasis. Subsegmental atelectasis is seen in the posterior left lower lung field. There is ectasia of bronchi in the lower lung fields. Hepatobiliary: No focal abnormalities are seen in the liver. There is no dilation of bile ducts. Gallbladder is not distended. Pancreas: Catheter is seen in head and body pancreas extending into the stomach and second portion of duodenum. There is no significant dilation of pancreatic duct. Small calcifications are seen in the head of the pancreas. Spleen: Unremarkable. Adrenals/Urinary Tract: Adrenals are unremarkable. There is no hydronephrosis. There are no renal or ureteral stones. Urinary bladder is unremarkable. Stomach/Bowel: Stomach is unremarkable. There is no significant dilation of small bowel loops. Appendix is not distinctly visualized.  There is no wall thickening in colon. Vascular/Lymphatic: Scattered arterial calcifications are seen in aorta and its major branches. Reproductive: Unremarkable. Other: There is moderate ascites with interval decrease. There is 11.2 x 6.1 cm loculated fluid collection with thick wall inferior to the liver. There is loculated fluid collection between stomach and pancreas measuring proximally 11.8 cm in maximum diameter without thick wall. There is 9.8 by 4.4 cm loculated fluid collection in the left lower quadrant abdomen with slightly thickened wall. There are pockets pneumoperitoneum in upper and mid abdomen. Musculoskeletal: Degenerative changes are noted in lumbar spine, most severe at L5-S1 level.  This finding has not changed. IMPRESSION: There is no evidence of intestinal obstruction. There is no hydronephrosis. There are multiple pockets of pneumoperitoneum in upper and mid abdomen. This may be related to recent paracentesis or suggest bowel perforation or infectious peritonitis. There is interval decrease in amount of ascites. Still, there is moderate residual ascites in abdomen and pelvis. There is a loculated thick-walled fluid collection inferior to the right lobe of liver measuring 11.2 cm in maximum diameter. There is another loculated fluid collection measuring 9.8 cm in maximum diameter with slightly thickened wall in left mid to lower abdomen. Possibility of infectious process in the peritoneal cavity is not excluded. Please correlate with laboratory findings and consider sampling of loculated fluid in right subhepatic location. There is interval worsening of infiltrates in the lower lung fields suggesting atelectasis/pneumonia. There is interval increase in right pleural effusion. Catheter is noted extending from the stomach through the pancreas to the duodenum. Electronically Signed   By: Ernie Avena M.D.   On: 09/18/2022 18:45   DG Chest Port 1 View  Result Date: 09/18/2022 CLINICAL  DATA:  Sepsis EXAM: PORTABLE CHEST 1 VIEW COMPARISON:  X-ray 09/05/2022 and older FINDINGS: Normal cardiopericardial silhouette. Calcified aorta. Left lung is clear. No left-sided consolidation, pneumothorax or effusion. Overlapping cardiac leads. There is a small to moderate right pleural effusion with some adjacent opacity. No edema. There is no clear pneumothorax identified although there is horizontal appearance of the margin of the pleural effusion. Hydropneumothorax is not excluded with this appearance. IMPRESSION: Developing small to moderate right pleural effusion with some adjacent opacity. In addition there is a horizontal orientation to the pleural effusion. Although no pneumothorax is seen, a hydropneumothorax is not excluded with this appearance. Recommend follow up chest CT to further delineate when appropriate. Electronically Signed   By: Karen Kays M.D.   On: 09/18/2022 17:23   US Paracentesis  Result Date: 09/15/2022 INDICATION: Patient with pancreatitis, recurrent ascites. Request for therapeutic paracentesis. EXAM: ULTRASOUND GUIDED THERAPEUTIC PARACENTESIS MEDICATIONS: 10 mL 1% lidocaine COMPLICATIONS: None immediate. PROCEDURE: Informed written consent was obtained from the patient after a discussion of the risks, benefits and alternatives to treatment. A timeout was performed prior to the initiation of the procedure. Initial ultrasound scanning demonstrates a moderate amount of loculated-appearing ascites within the left lateral abdomen. The left lateral abdomen was prepped and draped in the usual sterile fashion. 1% lidocaine was used for local anesthesia. Following this, a 19 gauge, 7-cm, Yueh catheter was introduced. An ultrasound image was saved for documentation purposes. The paracentesis was performed. The catheter was removed and a dressing was applied. The patient tolerated the procedure well without immediate post procedural complication. FINDINGS: A total of approximately 600 mL  of dark, yellow fluid was removed. IMPRESSION: Successful ultrasound-guided paracentesis yielding 600 mL of peritoneal fluid. Performed by: Loyce Dys PA-C Electronically Signed   By: Malachy Moan M.D.   On: 09/15/2022 15:06   DG Abd Portable 1V  Result Date: 09/13/2022 CLINICAL DATA:  [id number] Encounter for feeding tube placement [id number] EXAM: PORTABLE ABDOMEN - 1 VIEW COMPARISON:  09/02/2022. FINDINGS: The bowel gas pattern is non-obstructive. No evidence of pneumoperitoneum. No acute osseous abnormalities. Enteric tube is seen with its tip overlying the left paramedian lower abdomen, likely in the region of duodenal jejunal junction. There is at least small right pleural effusion, new since the prior study. IMPRESSION: 1. Enteric tube tip likely in the region of the duodenal jejunal junction. 2. At least small  right pleural effusion, new since the prior study. Electronically Signed   By: Jules Schick M.D.   On: 09/13/2022 10:27   US RENAL  Result Date: 09/08/2022 CLINICAL DATA:  Acute kidney injury, decompensated cirrhosis EXAM: RENAL / URINARY TRACT ULTRASOUND COMPLETE COMPARISON:  None Available. FINDINGS: Right Kidney: Renal measurements: 10.8 x 6.0 x 6.0 cm = volume: 195 mL. Echogenicity within normal limits. The lower pole the right kidney is obscured by overlying bowel gas. No mass or hydronephrosis visualized. Left Kidney: Renal measurements: 12.8 x 6.8 x 6.0 cm = volume: 222 mL. Echogenicity within normal limits. The upper pole of the left kidney is obscured by overlying bowel gas and osseous structures. No mass or hydronephrosis visualized. Bladder: Appears normal for degree of bladder distention. Other: Mild perihepatic ascites is present. IMPRESSION: 1. No hydronephrosis. 2. Mild ascites. Electronically Signed   By: Helyn Numbers M.D.   On: 09/08/2022 22:05   IR Paracentesis  Result Date: 09/06/2022 INDICATION: 57 year old male inpatient. History of alcohol abuse with chronic  pancreatitis. Admitted for acute pancreatitis. Team is requesting therapeutic and diagnostic paracentesis EXAM: ULTRASOUND GUIDED THERAPEUTIC AND DIAGNOSTIC PARACENTESIS MEDICATIONS: Lidocaine 1% 10 mL COMPLICATIONS: None immediate. PROCEDURE: Informed written consent was obtained from the patient after a discussion of the risks, benefits and alternatives to treatment. A timeout was performed prior to the initiation of the procedure. Initial ultrasound scanning demonstrates a small amount of ascites within the right lower abdominal quadrant. The right lower abdomen was prepped and draped in the usual sterile fashion. 1% lidocaine was used for local anesthesia. Following this, a 19 gauge, 7-cm, Yueh catheter was introduced. An ultrasound image was saved for documentation purposes. The paracentesis was performed. The catheter was removed and a dressing was applied. The patient tolerated the procedure well without immediate post procedural complication. FINDINGS: A total of approximately 1.7 L of amber fluid was removed. Samples were sent to the laboratory as requested by the clinical team. IMPRESSION: Successful ultrasound-guided therapeutic and diagnostic paracentesis yielding 1.7 liters of peritoneal fluid. Performed by: Anders Grant, NP Electronically Signed   By: Richarda Overlie M.D.   On: 09/06/2022 10:07   DG Chest Port 1 View  Result Date: 09/05/2022 CLINICAL DATA:  Tachycardia EXAM: PORTABLE CHEST 1 VIEW COMPARISON:  Chest x-ray 08/26/2022 FINDINGS: Enteric tube extends below the diaphragm. Cardiomediastinal silhouette is within normal limits. There is minimal bibasilar atelectasis and small left pleural effusion. There is no pneumothorax or acute fracture. IMPRESSION: Minimal bibasilar atelectasis and small left pleural effusion. Electronically Signed   By: Darliss Cheney M.D.   On: 09/05/2022 22:25   IR Paracentesis  Result Date: 09/02/2022 INDICATION: Patient with a history of alcohol abuse, chronic  pancreatitis admitted with acute pancreatitis and found to have new onset ascites. Interventional radiology asked to perform a diagnostic and therapeutic paracentesis EXAM: ULTRASOUND GUIDED PARACENTESIS MEDICATIONS: 1% lidocaine 20 mL COMPLICATIONS: None immediate. PROCEDURE: Informed written consent was obtained from the patient after a discussion of the risks, benefits and alternatives to treatment. A timeout was performed prior to the initiation of the procedure. Initial ultrasound scanning demonstrates a large amount of ascites within the right lower abdominal quadrant. The right lower abdomen was prepped and draped in the usual sterile fashion. 1% lidocaine was used for local anesthesia. Following this, a 19 gauge, 7-cm, Yueh catheter was introduced. An ultrasound image was saved for documentation purposes. The paracentesis was performed. The catheter was removed and a dressing was applied. The patient tolerated the  procedure well without immediate post procedural complication. FINDINGS: A total of approximately 1.8 L of blood-tinged fluid was removed. Samples were sent to the laboratory as requested by the clinical team. IMPRESSION: Successful ultrasound-guided paracentesis yielding 1.8 liters of peritoneal fluid. Procedure performed by Alwyn Ren NP and supervised by Dr. Lowella Dandy. Electronically Signed   By: Richarda Overlie M.D.   On: 09/02/2022 17:27   DG Abd Portable 1V  Result Date: 09/02/2022 CLINICAL DATA:  57 year old male feeding tube placement. EXAM: PORTABLE ABDOMEN - 1 VIEW COMPARISON:  08/30/2022 and earlier. FINDINGS: Portable AP view at 1044 hours. Enteric feeding tube courses from the visible lower chest into the abdomen, crosses midline and conforms to the duodenum C-loop. The tip is in the midline at the level of the distal duodenum. Paucity of bowel gas in the upper abdomen. Patchy right lung base opacity, not significantly changed from CT Abdomen and Pelvis on 08/26/2022. IMPRESSION: Enteric  feeding tube tip at the level of the distal duodenum. Electronically Signed   By: Odessa Fleming M.D.   On: 09/02/2022 11:21   ECHOCARDIOGRAM COMPLETE  Result Date: 09/01/2022    ECHOCARDIOGRAM REPORT   Patient Name:   Samuel Blevins Date of Exam: 09/01/2022 Medical Rec #:  [id number]          Height:       69.0 in Accession #:    [id number]         Weight:       163.4 lb Date of Birth:  [birth year]          BSA:          1.896 m Patient Age:    57 years           BP:           113/72 mmHg Patient Gender: M                  HR:           103 bpm. Exam Location:  Inpatient Procedure: 2D Echo, Color Doppler and Cardiac Doppler Indications:    I50.9* Heart failure (unspecified)  History:        Patient has no prior history of Echocardiogram examinations.                 Risk Factors:Hypertension and ETOH.  Sonographer:    Irving Burton Senior RDCS Referring Phys: [phone] PAULA M GUENTHER IMPRESSIONS  1. Left ventricular ejection fraction, by estimation, is 60 to 65%. The left ventricle has normal function. The left ventricle has no regional wall motion abnormalities. There is mild concentric left ventricular hypertrophy. Left ventricular diastolic parameters are consistent with Grade I diastolic dysfunction (impaired relaxation).  2. Right ventricular systolic function is normal. The right ventricular size is normal. There is normal pulmonary artery systolic pressure. The estimated right ventricular systolic pressure is 32.6 mmHg.  3. The mitral valve is grossly normal. No evidence of mitral valve regurgitation.  4. The aortic valve is tricuspid. Aortic valve regurgitation is not visualized.  5. The inferior vena cava is normal in size with greater than 50% respiratory variability, suggesting right atrial pressure of 3 mmHg. Comparison(s): No prior Echocardiogram. FINDINGS  Left Ventricle: Left ventricular ejection fraction, by estimation, is 60 to 65%. The left ventricle has normal function. The left ventricle has no regional wall  motion abnormalities. The left ventricular internal cavity size was normal in size. There is  mild concentric left ventricular hypertrophy. Left ventricular diastolic  parameters are consistent with Grade I diastolic dysfunction (impaired relaxation). Right Ventricle: The right ventricular size is normal. No increase in right ventricular wall thickness. Right ventricular systolic function is normal. There is normal pulmonary artery systolic pressure. The tricuspid regurgitant velocity is 2.72 m/s, and  with an assumed right atrial pressure of 3 mmHg, the estimated right ventricular systolic pressure is 32.6 mmHg. Left Atrium: Left atrial size was normal in size. Right Atrium: Right atrial size was normal in size. Pericardium: Trivial pericardial effusion is present. The pericardial effusion is posterior to the left ventricle. Mitral Valve: The mitral valve is grossly normal. No evidence of mitral valve regurgitation. Tricuspid Valve: The tricuspid valve is grossly normal. Tricuspid valve regurgitation is mild. Aortic Valve: The aortic valve is tricuspid. Aortic valve regurgitation is not visualized. Pulmonic Valve: The pulmonic valve was grossly normal. Pulmonic valve regurgitation is trivial. Aorta: The aortic root and ascending aorta are structurally normal, with no evidence of dilitation. Venous: The inferior vena cava is normal in size with greater than 50% respiratory variability, suggesting right atrial pressure of 3 mmHg. IAS/Shunts: No atrial level shunt detected by color flow Doppler.  LEFT VENTRICLE PLAX 2D LVIDd:         3.50 cm   Diastology LVIDs:         2.40 cm   LV e' medial:    7.40 cm/s LV PW:         1.30 cm   LV E/e' medial:  10.1 LV IVS:        1.20 cm   LV e' lateral:   9.03 cm/s LVOT diam:     1.80 cm   LV E/e' lateral: 8.3 LV SV:         45 LV SV Index:   23 LVOT Area:     2.54 cm  RIGHT VENTRICLE RV S prime:     14.90 cm/s TAPSE (M-mode): 1.6 cm LEFT ATRIUM             Index        RIGHT  ATRIUM           Index LA diam:        3.80 cm 2.00 cm/m   RA Area:     11.60 cm LA Vol (A2C):   47.2 ml 24.90 ml/m  RA Volume:   22.90 ml  12.08 ml/m LA Vol (A4C):   38.0 ml 20.04 ml/m LA Biplane Vol: 43.0 ml 22.68 ml/m  AORTIC VALVE LVOT Vmax:   110.00 cm/s LVOT Vmean:  80.500 cm/s LVOT VTI:    0.175 m  AORTA Ao Root diam: 2.90 cm Ao Asc diam:  3.30 cm MITRAL VALVE                TRICUSPID VALVE MV Area (PHT): 3.33 cm     TR Peak grad:   29.6 mmHg MV Decel Time: 228 msec     TR Vmax:        272.00 cm/s MV E velocity: 74.90 cm/s MV A velocity: 102.00 cm/s  SHUNTS MV E/A ratio:  0.73         Systemic VTI:  0.18 m                             Systemic Diam: 1.80 cm Nona Dell MD Electronically signed by Nona Dell MD Signature Date/Time: 09/01/2022/12:31:38 PM    Final    MR ABDOMEN MRCP  W WO CONTAST  Result Date: 08/30/2022 CLINICAL DATA:  Chronic pancreatitis, new ascites, evaluate for pancreatic duct disruption EXAM: MRI ABDOMEN WITHOUT AND WITH CONTRAST (INCLUDING MRCP) TECHNIQUE: Multiplanar multisequence MR imaging of the abdomen was performed both before and after the administration of intravenous contrast. Heavily T2-weighted images of the biliary and pancreatic ducts were obtained, and three-dimensional MRCP images were rendered by post processing. CONTRAST:  7mL GADAVIST GADOBUTROL 1 MMOL/ML IV SOLN COMPARISON:  CT abdomen pelvis, 08/26/2022 FINDINGS: Examination is significantly limited by pervasive breath motion artifact throughout. Lower chest: Small bilateral pleural effusions Hepatobiliary: Hepatomegaly, maximum coronal span 20.2 cm. Mild hepatic steatosis, assessment is significantly limited by breath motion artifact on in and opposed phase imaging. No solid liver abnormality is seen. No gallstones. Gallbladder wall thickening, nonspecific in the setting of ascites. No biliary ductal dilatation. Pancreas: Pancreas divisum. No pancreatic ductal dilatation. No specific evidence of acute  pancreatitis. Spleen: Normal in size without significant abnormality. Adrenals/Urinary Tract: Adrenal glands are unremarkable. Kidneys are normal, without renal calculi, solid lesion, or hydronephrosis. Stomach/Bowel: Stomach is within normal limits. Thickening of multiple loops of decompressed small bowel in the central abdomen (series 5, image 38). Vascular/Lymphatic: No significant vascular findings are present. No enlarged abdominal lymph nodes. Other: No abdominal wall hernia or abnormality. Moderate volume ascites throughout the abdomen and pelvis. Musculoskeletal: No acute or significant osseous findings. IMPRESSION: 1. Pancreas divisum. No pancreatic ductal dilatation. No specific evidence of acute pancreatitis. 2. Thickening of multiple loops of decompressed small bowel in the central abdomen, suggestive of nonspecific enteritis. 3. Moderate volume ascites throughout the abdomen and pelvis. Small bilateral pleural effusions. 4. Hepatomegaly and mild hepatic steatosis. Electronically Signed   By: Jearld Lesch M.D.   On: 08/30/2022 19:54   DG Abd Portable 1V  Result Date: 08/30/2022 CLINICAL DATA:  Feeding tube placement. EXAM: PORTABLE ABDOMEN - 1 VIEW COMPARISON:  08/30/2022 at 2:15 p.m. FINDINGS: Enteric feeding tube has been further inserted. Tube curls in the right mid abdomen, expected location of the distal stomach, metallic tip in the left upper quadrant consistent with the gastric fundus. IMPRESSION: Enteric feeding tube position as detailed above. Electronically Signed   By: Amie Portland M.D.   On: 08/30/2022 16:24   DG Abd Portable 1V  Result Date: 08/30/2022 CLINICAL DATA:  Feeding tube placement. EXAM: PORTABLE ABDOMEN - 1 VIEW COMPARISON:  None Available. FINDINGS: Tip of the weighted enteric tube in the right upper quadrant in the region of the distal stomach or proximal duodenum. No bowel dilatation to suggest obstruction IMPRESSION: Tip of the weighted enteric tube in the right upper  quadrant in the region of the distal stomach or proximal duodenum. Electronically Signed   By: Narda Rutherford M.D.   On: 08/30/2022 14:42   IR Paracentesis  Result Date: 08/30/2022 INDICATION: Patient with a history of alcohol abuse, chronic pancreatitis admitted for acute pancreatitis and found to have new onset ascites. Request received to perform diagnostic and therapeutic paracentesis. EXAM: ULTRASOUND GUIDED DIAGNOSTIC AND THERAPEUTIC PARACENTESIS MEDICATIONS: 8 cc 1% lidocaine COMPLICATIONS: None immediate. PROCEDURE: Informed written consent was obtained from the patient after a discussion of the risks, benefits and alternatives to treatment. A timeout was performed prior to the initiation of the procedure. Initial ultrasound scanning demonstrates a moderate amount of ascites within the right lower abdominal quadrant. The right lower abdomen was prepped and draped in the usual sterile fashion. 1% lidocaine was used for local anesthesia. Following this, a 19 gauge, 7-cm, Griffith Citron  catheter was introduced. An ultrasound image was saved for documentation purposes. The paracentesis was performed. The catheter was removed and a dressing was applied. The patient tolerated the procedure well without immediate post procedural complication. FINDINGS: A total of approximately 3 L of yellow-red fluid was removed. Samples were sent to the laboratory as requested by the clinical team. IMPRESSION: Successful ultrasound-guided paracentesis yielding 3 liters of peritoneal fluid. Procedure performed by Mina Marble, PA-C Electronically Signed   By: Roanna Banning M.D.   On: 08/30/2022 12:26   IR Paracentesis  Result Date: 08/26/2022 INDICATION: 57 year old male. History of alcohol abuse with chronic pancreatitis presented to ED with abdominal pain. Patient presents for therapeutic and diagnostic paracentesis EXAM: ULTRASOUND GUIDED THERAPEUTIC AND DIAGNOSTIC PARACENTESIS MEDICATIONS: Lidocaine 1% 10 mL COMPLICATIONS: None  immediate. PROCEDURE: Informed written consent was obtained from the patient after a discussion of the risks, benefits and alternatives to treatment. A timeout was performed prior to the initiation of the procedure. Initial ultrasound scanning demonstrates a moderate amount of ascites within the right lower abdominal quadrant. The right lower abdomen was prepped and draped in the usual sterile fashion. 1% lidocaine was used for local anesthesia. Following this, a 19 gauge, 7-cm, Yueh catheter was introduced. An ultrasound image was saved for documentation purposes. The paracentesis was performed. The catheter was removed and a dressing was applied. The patient tolerated the procedure well without immediate post procedural complication. FINDINGS: A total of approximately 3.1 L of serosanguineous fluid was removed. Samples were sent to the laboratory as requested by the clinical team. IMPRESSION: Successful ultrasound-guided paracentesis yielding 3.1 liters of peritoneal fluid. Performed by: Anders Grant, NP Electronically Signed   By: Marliss Coots M.D.   On: 08/26/2022 15:28   CT ABDOMEN PELVIS W CONTRAST  Result Date: 08/26/2022 CLINICAL DATA:  Acute abdominal pain EXAM: CT ABDOMEN AND PELVIS WITH CONTRAST TECHNIQUE: Multidetector CT imaging of the abdomen and pelvis was performed using the standard protocol following bolus administration of intravenous contrast. RADIATION DOSE REDUCTION: This exam was performed according to the departmental dose-optimization program which includes automated exposure control, adjustment of the mA and/or kV according to patient size and/or use of iterative reconstruction technique. CONTRAST:  75mL OMNIPAQUE IOHEXOL 350 MG/ML SOLN COMPARISON:  CT 08/21/2022 and older FINDINGS: Lower chest: Coronary artery calcifications are seen. Please correlate for other coronary risk factors. Decreasing pleural effusions and adjacent opacities with some residual. Recommend continued  follow-up. Hepatobiliary: Nodular contours of the diffusely fatty liver. Please correlate for history of chronic liver disease. Gallbladder is nondilated. Portal vein is patent. Pancreas: Preserved pancreatic parenchyma. There are some calcifications in the pancreatic head near the course of the pancreatic duct, unchanged from previous. Spleen: Normal in size without focal abnormality. Adrenals/Urinary Tract: Adrenal glands are preserved. Kidneys are unremarkable. No enhancing mass or collecting system dilatation. Normal caliber ureters down to the bladder. Bladder has a preserved contour. Bladder is underdistended. Stomach/Bowel: No oral contrast. Large bowel has a normal course and caliber with scattered stool. Stomach is underdistended. Small bowel is also nondilated. Vascular/Lymphatic: Aortic atherosclerosis. No enlarged abdominal or pelvic lymph nodes. Reproductive: Heterogeneous prostate with some mass effect along the base of the bladder. Please correlate for BPH changes in the patient's PSA. Other: Moderate diffuse ascites once again identified. The amount of ascites is increased from the prior CT scan. Small fat containing umbilical hernia with some mesh. No free air. Musculoskeletal: Scattered degenerative changes of the spine and pelvis. There are bridging osteophytes along the  sacroiliac joints. Trace retrolisthesis of L5 on S1 with some disc bulging and stenosis. IMPRESSION: Increasing ascites. Nodular fatty liver. Stable areas of pancreatic calcification. Decreasing pleural effusions and adjacent opacities with some residual. Recommend continued follow-up Electronically Signed   By: Karen Kays M.D.   On: 08/26/2022 11:09   DG Chest 2 View  Result Date: 08/26/2022 CLINICAL DATA:  Epigastric pain EXAM: CHEST - 2 VIEW COMPARISON:  Previous studies including the examination of 08/17/2022 FINDINGS: Cardiac size is within normal limits. Low lung volumes. Linear densities are seen in the lower lung  fields. There are no signs of alveolar pulmonary edema or focal pulmonary consolidation. There is minimal blunting of lateral CP angles. There is no pneumothorax. IMPRESSION: There are increased markings in the lower lung fields, more so on the right side suggesting atelectasis. There is blunting of lateral CP angles suggesting small bilateral effusions. Electronically Signed   By: Ernie Avena M.D.   On: 08/26/2022 10:35    I have personally spent 58  minutes involved in face-to-face and non-face-to-face activities for this patient on the day of the visit. Professional time spent includes the following activities: Preparing to see the patient (review of tests), Obtaining and/or reviewing separately obtained history (admission/discharge record), Performing a medically appropriate examination and/or evaluation , Ordering medications/tests/procedures, referring and communicating with other health care professionals, Documenting clinical information in the EMR, Independently interpreting results (not separately reported), Communicating results to the patient/family/caregiver, Counseling and educating the patient/family/caregiver and Care coordination (not separately reported).   Plan d/w requesting provider as well as ID pharm D  Note: This document was prepared using dragon voice recognition software and may include unintentional dictation errors.   Electronically signed by:   Odette Fraction, MD Infectious Disease Physician [facility] Pager: [phone]

## 2022-09-23 NOTE — Progress Notes (Signed)
AMA discharge note   Samuel Blevins  ONG:[id number] DOB: Jun 21, 1965 DOA: 09/18/2022 PCP: No primary care provider on file.   Brief Narrative:  57 year old with history of recurrent pancreatitis, chronic pancreatic insufficiency, HTN , recurrent alcohol use comes to the hospital with complaints of abdominal pain, nausea, vomiting and diarrhea.  Patient was also found to have acute recurrent alcoholic pancreatitis.  Initially patient had left AMA about 4-5 days prior to this admission.  Patient readmitted for acute recurrent alcoholic pancreatitis with worsening ascites, initial labs of ascites fluid indicate elevated amylase concerning for pancreatic duct disruption.    Hospital course complicated with presence of recurrent ascites, infected ascites fluid, ongoing abdominal pain and acute renal failure.  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 Being followed by GI, nephrology, and ID.   GI previously recommending transfer to outside facility for ERCP to attempt to stent pancreatic duct disruption. [facility] accepted patient for procedure on 09/17/2022.  Procedure was apparently difficult procedure with manipulation of the pancreas that ultimately had to be accessed via the gastric wall, stent was successfully placed as planned.  Patient's NG tube was instantly removed by staff during procedure then purposefully removed by patient post-anesthesia.  Patient left our facility AMA on the 24th due to "I have to go get my check" -we discussed the numerous risks of leaving hospital.  Fortunately patient did return to our facility later in the evening and was readmitted back to our service.  CT abdomen at intake did show pneumoperitoneum as well as diffuse loculated regions in the abdomen concerning for abscess, IR asked for drain placement.  Dr. Corliss Parish GI at [facility] confirms that pneumoperitoneum is expected given their  approach through the gastric wall.  Will advance diet as tolerated with ultimate plans for discharge home (patient is homeless which makes dispo difficult) with ultimate follow-up at [facility] for repeat ERCP 2 weeks after pancreatic stenting(7/23), IR managing bilateral abdominal drains.  Assessment & Plan:   Principal Problem:   SBP (spontaneous bacterial peritonitis) (HCC) Active Problems:   Chronic alcohol abuse   Chronic anemia   Chronic pain syndrome   Pancreatic insufficiency   Pseudocyst of pancreas due to acute pancreatitis   Alcohol-induced chronic pancreatitis (HCC)   Acute on chronic pancreatitis (HCC)   Pancreatic duct disruption   Enteritis due to Clostridium difficile   Abscess of abdominal cavity (HCC)  Decompensated liver cirrhosis with ascites Sepsis secondary to VRE positive pancreatic ascites Proteus species positive abdominal abscess Questionable bilateral abdominal abscesses -Pancreaticogastrostomy stent placed 09/17/2022 at [facility] with Dr. Corliss Parish.  Plan to repeat ERCP at their facility in 2 weeks to evaluate stent and likely replace with larger diameter stent. -Sepsis evidenced by tachycardia and leukocytosis - source: klebsiella ascites vs cdiff (both presumed POA). -Paracentesis 7/1 - 3L; 7/5 - 3L; 7/8 2L, 7/12 1.7 L. 7/21 600cc -CT 24/25th consistent with loculated abdominal fluid vs abscess -drain placed by IR -Multiple Cortrak's removed by patient -dislodged again intentionally 7/23 at [facility]; will keep NG out; advance diet as tolerated -Cultures(blood and paracentesis) initially negative however cultures on paracentesis from 7/12 shows vancomycin resistant enterococcus; drain growing proteus- on daptomycin/ceftriaxone per ID - GI recommending repeat paracentesis next week to check amylase levels and evidence of ongoing infection.  Improving C. difficile colitis:  PO vancomycin completed per discussion with ID today -Continue prophylactic vancomycin until completing  antibiotic courses as above   Anemia of  chronic disease:  Hemoglobin downtrend ongoing, 6.8 today - patient has chart flagged for blood product refusal but he verbally agreed to blood and wants the flag removed. 1u PRBC to transfused successfully - hgb stable  Thrombocytosis -Questionably reactive in the setting of above infection/anemai.  Hypoalbuminemia Hypovolemic hyponatremia Hypokalemia -Secondary to above, improving with p.o. intake as NG tube has been removed we will not replace it -Advance diet as tolerated -soft diet to regular over the next 24 hours pending clinical status.  Recurrent alcohol induced pancreatitis. Nausea vomiting, resolved -Secondary to chronic alcohol use/abuse p.o. intake as tolerated, pain control avoiding IV narcotics, antiemetics, PPI   Severe protein caloric malnutrition High risk for refeeding syndrome -Complicated by chronic alcohol use  Alcohol abuse - Alcohol withdrawal protocol completed, currently outside the window for any further withdrawal symptoms(assuming patient did not have marked amounts of alcohol during the few hours he left AMA on the 24th).   No further need for benzos IV versus p.o.    Essential hypertension -Currently well-controlled off medications, as needed hydralazine for hypertensive events   GERD/duodenitis -Continue PPI   Acute kidney injury with hyperkalemia, resolved:  Initially followed by nephrology treated with [facility], urine output and labs normalizing   Acute hypoxic respiratory failure, transient and resolved:  Acute episode overnight on the night of 09/05/2022 Chest x-ray reported atelectasis, likely secondary to enlarging ascites. Improved s/p paracentesis  Noncompliance, profound, recurrent Acute metabolic/toxic encephalopathy, resolved -Multiple episodes of abnormal behavior previously, multiple episodes of Cortrak removal, nursing staff noted patient eating cigarettes last week.   -Remains alert and  oriented, patient previously left AMA on the 24th but did return later that day.  DVT prophylaxis: enoxaparin (LOVENOX) injection 40 mg Start: 09/18/22 1745 Code Status:   Code Status: Full Code Family Communication: None present  Status is: Inpatient  Dispo: The patient is from: Home              Anticipated d/c is to: Home in 24 to 48 hours  Consultants:  GI, ID, general surgery  Procedures:  Multiple cortrack placements, multiple paracentesis, EEG, abdominal drain placement Patient had pancreaticogastrostomy stent placed at [facility] 7/23 via ERCP and EUS  Antimicrobials:  Daptomycin PO vancomycin completed  Subjective: No acute issues or events overnight, pain currently well-controlled tolerating p.o. without difficulty denies nausea vomiting diarrhea constipation headache fevers chills or chest pain  Objective: Vitals:   09/22/22 1527 09/22/22 1921 09/22/22 2304 09/23/22 0333  BP: (!) 149/84 (!) 157/100 (!) 154/94 (!) 144/84  Pulse: 99 (!) 107 (!) 105 (!) 110  Resp: 16 20 20 20   Temp: 98.2 F (36.8 C) 98.5 F (36.9 C) 97.7 F (36.5 C) 98.3 F (36.8 C)  TempSrc: Oral Oral Oral Oral  SpO2: 97% 97% 97% 100%  Weight:      Height:        Intake/Output Summary (Last 24 hours) at 09/23/2022 0828 Last data filed at 09/23/2022 0800 Gross per 24 hour  Intake 549.93 ml  Output 790 ml  Net -240.07 ml    Filed Weights   09/18/22 1520  Weight: 68.4 kg    Examination:  General:  Pleasantly resting in bed, No acute distress. HEENT:  Normocephalic atraumatic.  Sclerae nonicteric, noninjected.  Extraocular movements intact bilaterally. Neck:  Without mass or deformity.  Trachea is midline. Lungs:  Clear to auscultate bilaterally without rhonchi, wheeze, or rales. Heart:  Regular rate and rhythm.  Without murmurs, rubs, or gallops. Abdomen: Soft tender diffusely, right  lower and left lower quadrant drains draining clear yellow fluid. Extremities: Without cyanosis, clubbing,  or obvious deformity. Skin:  Warm and dry, no erythema.    LOS: 5 days   Time spent:  Azucena Fallen, DO [facility]  If 7PM-7AM, please contact night-coverage [email or web address]  09/23/2022, 8:28 AM

## 2022-09-24 ENCOUNTER — Ambulatory Visit: Payer: Medicaid Other | Admitting: Critical Care Medicine

## 2022-09-24 DIAGNOSIS — K652 Spontaneous bacterial peritonitis: Secondary | ICD-10-CM | POA: Diagnosis not present

## 2022-09-24 LAB — BODY FLUID CULTURE W GRAM STAIN: Gram Stain: NONE SEEN

## 2022-09-24 NOTE — Progress Notes (Signed)
AMA discharge note   Samuel Blevins  EXB:[id number] DOB: Jul 18, 1965 DOA: 09/18/2022 PCP: No primary care provider on file.   Brief Narrative:  57 year old with history of recurrent pancreatitis, chronic pancreatic insufficiency, HTN , recurrent alcohol use comes to the hospital with complaints of abdominal pain, nausea, vomiting and diarrhea.  Patient was also found to have acute recurrent alcoholic pancreatitis.  Initially patient had left AMA about 4-5 days prior to this admission.  Patient readmitted for acute recurrent alcoholic pancreatitis with worsening ascites, initial labs of ascites fluid indicate elevated amylase concerning for pancreatic duct disruption.    Hospital course complicated with presence of recurrent ascites, infected ascites fluid, ongoing abdominal pain and acute renal failure.  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 Being followed by GI, nephrology, and ID.   GI previously recommending transfer to outside facility for ERCP to attempt to stent pancreatic duct disruption. [facility] accepted patient for procedure on 09/17/2022.  Procedure was apparently difficult procedure with manipulation of the pancreas that ultimately had to be accessed via the gastric wall, stent was successfully placed as planned.  Patient's NG tube was instantly removed by staff during procedure then purposefully removed by patient post-anesthesia.  Patient left our facility AMA on the 24th due to "I have to go get my check" -we discussed the numerous risks of leaving hospital.  Fortunately patient did return to our facility later in the evening and was readmitted back to our service.  CT abdomen at intake did show pneumoperitoneum as well as diffuse loculated regions in the abdomen concerning for abscess, IR asked for drain placement.  Dr. Corliss Parish GI at [facility] confirms that pneumoperitoneum is expected given their  approach through the gastric wall.  Will advance diet as tolerated with ultimate plans for discharge home (patient is homeless which makes dispo difficult) with ultimate follow-up at [facility] for repeat ERCP 2 weeks after pancreatic stenting(7/23), IR managing bilateral abdominal drains.  Assessment & Plan:   Principal Problem:   SBP (spontaneous bacterial peritonitis) (HCC) Active Problems:   Chronic alcohol abuse   Chronic anemia   Chronic pain syndrome   Pancreatic insufficiency   Pseudocyst of pancreas due to acute pancreatitis   Alcohol-induced chronic pancreatitis (HCC)   Acute on chronic pancreatitis (HCC)   Pancreatic duct disruption   Enteritis due to Clostridium difficile   Abscess of abdominal cavity (HCC)  Decompensated liver cirrhosis with ascites Sepsis secondary to VRE positive pancreatic ascites Proteus species positive abdominal abscess Cdiff Colitis, resolving -Pancreaticogastrostomy stent placed 09/17/2022 at [facility] with Dr. Corliss Parish.  Plan to repeat ERCP at their facility in 2 weeks(first full week of August) to evaluate stent and likely replace with larger diameter stent. - GI here initially recommending repeat paracentesis this week to check amylase levels and evidence of ongoing infection -will collect from his left upper quadrant JP drain given this appears to be draining purely ascites at this time. - IR (Dr Fredia Sorrow) -requires repeat CT abdomen pelvis with contrast for drains can be removed.  Hoping this can be done in the next few days as patient is an unsafe discharge from our facility with drains intact. -Sepsis evidenced by tachycardia and leukocytosis - source: klebsiella ascites vs cdiff (both presumed POA). -Paracentesis 7/1 - 3L; 7/5 - 3L; 7/8 2L, 7/12 1.7 L. 7/21 600cc -CT 24/25th consistent with loculated abdominal fluid vs abscess -drain placed by IR -Multiple Cortrak's removed by patient -  dislodged again intentionally 7/23 at [facility]; will keep NG out; advance  diet as tolerated -Cultures (blood and paracentesis) initially negative however cultures on repeat paracentesis from 7/12 shows vancomycin resistant enterococcus; drain growing proteus- on daptomycin/ceftriaxone per ID **(Patient also had been diagnosed with Cdiff - currently completed initial course on prophylactic dosing now)   Anemia of chronic disease:   Patient has chart flagged for blood product refusal but he verbally agreed to blood and wants the flag removed. 1u PRBC 7/22 transfused successfully - hgb low but stable  Thrombocytosis -Questionably reactive in the setting of above infection/anemai.  Hypoalbuminemia Hypovolemic hyponatremia Hypokalemia -Secondary to above, improving with p.o. intake as NG tube has been removed we will not replace it -Advance diet as tolerated - Regular  Recurrent alcohol induced pancreatitis. Nausea vomiting, resolved - Secondary to chronic alcohol use/abuse p.o. intake as tolerated, pain control avoiding IV narcotics, antiemetics, PPI ongoing PRN   Severe protein caloric malnutrition High risk for refeeding syndrome -Complicated by chronic alcohol use  Alcohol abuse - Alcohol withdrawal protocol completed, currently outside the window for any further withdrawal symptoms(assuming patient did not have marked amounts of alcohol during the few hours he left AMA on the 24th).   No further need for benzos IV versus p.o.    Essential hypertension -Currently well-controlled off medications, as needed hydralazine for hypertensive events   GERD/duodenitis -Continue PPI   Acute kidney injury with hyperkalemia, resolved:  Initially followed by nephrology treated with [facility], urine output and labs normalizing   Acute hypoxic respiratory failure, transient and resolved:  Acute episode overnight on the night of 09/05/2022 Chest x-ray reported atelectasis, likely secondary to enlarging ascites. Improved s/p paracentesis  Noncompliance, profound,  recurrent Acute metabolic/toxic encephalopathy, resolved -Multiple episodes of abnormal behavior previously, multiple episodes of Cortrak removal, nursing staff noted patient eating cigarettes last week.   -Remains alert and oriented, patient previously left AMA on the 24th but did return later that day.  DVT prophylaxis: enoxaparin (LOVENOX) injection 40 mg Start: 09/18/22 1745 Code Status:   Code Status: Full Code Family Communication: None present  Status is: Inpatient  Dispo: The patient is from: Home              Anticipated d/c is to: Home in 24 to 48 hours  Consultants:  GI, ID, general surgery  Procedures:  Multiple cortrack placements(due to multiple removals by patient), multiple paracentesis, EEG, abdominal drain placement Patient had pancreaticogastrostomy stent placed at [facility] 7/23 via ERCP and EUS  Antimicrobials:  Daptomycin, ceftriaxone PO vancomycin treatment completed (on prophylactic dose now)  Subjective: No acute issues or events overnight, pain currently well-controlled tolerating p.o. without difficulty denies nausea vomiting diarrhea constipation headache fevers chills or chest pain  Objective: Vitals:   09/23/22 1953 09/24/22 0029 09/24/22 0625 09/24/22 0824  BP: 138/82 121/82 127/80 (!) 150/80  Pulse: 98 (!) 108 (!) 108 100  Resp: 18 18 18    Temp: 98 F (36.7 C) 98.2 F (36.8 C) 98.2 F (36.8 C) 98.1 F (36.7 C)  TempSrc: Oral Oral Oral Oral  SpO2: 98% 100% 98% 97%  Weight:      Height:        Intake/Output Summary (Last 24 hours) at 09/24/2022 0842 Last data filed at 09/24/2022 0525 Gross per 24 hour  Intake 524 ml  Output 1275 ml  Net -751 ml    Filed Weights   09/18/22 1520  Weight: 68.4 kg    Examination:  General:  Pleasantly resting  in bed, No acute distress. HEENT:  Normocephalic atraumatic.  Sclerae nonicteric, noninjected.  Extraocular movements intact bilaterally. Neck:  Without mass or deformity.  Trachea is  midline. Lungs:  Clear to auscultate bilaterally without rhonchi, wheeze, or rales. Heart:  Regular rate and rhythm.  Without murmurs, rubs, or gallops. Abdomen: Soft tender diffusely, right lower and left lower quadrant drains draining clear yellow fluid. Extremities: Without cyanosis, clubbing, or obvious deformity. Skin:  Warm and dry, no erythema.    LOS: 6 days  (Patient was initially admitted for this problem 08/17/22 but has left the hospital multiple times - truly day 39(was here 33 days prior to this stay for this ongoing complaint)).  Time spent:  Azucena Fallen, DO [facility]  If 7PM-7AM, please contact night-coverage [email or web address]  09/24/2022, 8:42 AM

## 2022-09-25 ENCOUNTER — Inpatient Hospital Stay (HOSPITAL_COMMUNITY): Payer: MEDICAID

## 2022-09-25 DIAGNOSIS — K860 Alcohol-induced chronic pancreatitis: Secondary | ICD-10-CM | POA: Diagnosis not present

## 2022-09-25 DIAGNOSIS — K652 Spontaneous bacterial peritonitis: Secondary | ICD-10-CM | POA: Diagnosis not present

## 2022-09-25 DIAGNOSIS — A0471 Enterocolitis due to Clostridium difficile, recurrent: Secondary | ICD-10-CM | POA: Diagnosis not present

## 2022-09-25 LAB — AEROBIC/ANAEROBIC CULTURE W GRAM STAIN (SURGICAL/DEEP WOUND)

## 2022-09-25 LAB — SEDIMENTATION RATE: Sed Rate: 138 mm/hr — ABNORMAL HIGH (ref 0–16)

## 2022-09-25 LAB — C-REACTIVE PROTEIN: CRP: 22.3 mg/dL — ABNORMAL HIGH (ref <1.0)

## 2022-09-25 MED ORDER — METRONIDAZOLE 500 MG PO TABS
500.0000 mg | ORAL_TABLET | Freq: Two times a day (BID) | ORAL | Status: DC
  Administered 2022-09-25 – 2022-10-02 (×11): 500 mg via ORAL
  Filled 2022-09-25 (×16): qty 1

## 2022-09-25 MED ORDER — IOHEXOL 350 MG/ML SOLN
75.0000 mL | Freq: Once | INTRAVENOUS | Status: AC | PRN
  Administered 2022-09-25: 75 mL via INTRAVENOUS

## 2022-09-25 NOTE — Progress Notes (Signed)
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   Chronic pain syndrome   Chronic alcohol abuse   Pancreatic insufficiency   Alcohol-induced chronic pancreatitis (HCC)   Acute on chronic pancreatitis (HCC)   Pancreatic duct disruption   Enteritis due to Clostridium difficile   Abscess of abdominal cavity (HCC)  Current antibiotics Cefepime Daptomycin Total days of antibiotics 20  Lines/Hardwares:  Interval Events: Remains afebrile, s/p ascites fluid from left JP with amylase 37, fluid cx NG     Assessment 71 Y O male with history of recurrent alcoholic pancreatitis, chronic pancreatic insufficiency, HTN who presented 7/24 with nausea, vomiting and abdominal pain with diarrhea. Patient had left AMA earlier same day after hospital course  remarkable for recurrent ascites, SBP, ongoing abdomen pain, acute renal failure.There was concern for pancreatic duct disruption causing recurrent amyloid positive ascites, SBP. He was started on Daptomycin for VRE growing from periotoneal fluid cx on 7/12 ( Ascitic fluid WBC 16,390, neutrophilic). He was briefly transferred to [facility] for stent placement for biliary duct on 09/17/2022.  Procedure was apparently difficult and required prolonged procedure with difficult manipulation of the pancreas but stent was successfully placed as planned and he was transferred back. There is plan for repeat ERCP in the next 2 weeks at [facility]. He was also found to have C. difficile and was treated with PO Vancomycin.     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 Multiple focal fluid collections are noted along the greater curvature of the stomach, inferior to the right hepatic lobe, and in the left mid to lower abdomen. Pneumoperitoneum again noted, stable. 7/25 drain placement by IR.  Cultures polymicrobial with VRE, Proteus mirabilis, Enterobacter aerogenes 7/31 ascitic fluid amyalst 37, cx NGTD  # Recent C diff colitis  On prophylactic PO Vancomycin   Recommendations Continue Daptomycin and cefepime, add metronidazole for possible anaerobic growth  Fu IR aspirate cx 7/25 and 7/30 JP cx  to  completion  Fu CT abd/pelvis today Monitor CBC, CMP and CPK  Drain management per IR Will need to check with GI regarding timing of ERCP at [facility] and care coordination  Following peripherally this week, please call with questions.   Rest of the management as per the primary team. Thank you for the consult. Please page with pertinent questions or concerns.  ______________________________________________________________________ Subjective patient seen and examined at the bedside.  Reports improved abdominal pain and appetite better Vitals BP 135/84 (BP Location: Left Arm)   Pulse 100   Temp 98.4 F (36.9 C) (Oral)   Resp 20   Ht 5\' 9"  (1.753 m)   Wt 68.4 kg   SpO2 99%   BMI 22.27 kg/m     Physical Exam Constitutional: Adult male sitting in the bed and appears comfortable    Comments: Poor dental hygiene with dental caries  Cardiovascular:     Rate and Rhythm: Normal rate and regular rhythm.     Heart sounds:  Pulmonary:     Effort: Pulmonary effort is normal on room air  Comments:   Abdominal:     Palpations: Abdomen is soft.     Tenderness: Mildly distended, non tender 2 drains in each site with minimal clear fluid in the bulb  Musculoskeletal:        General: No swelling or  tenderness in peripheral joints Skin:    Comments:   Neurological:     General: Awake, alert and oriented, following commands  Psychiatric:        Mood and Affect: Mood normal.   Pertinent Microbiology Results for orders placed or performed during the hospital encounter of 09/18/22  Resp panel by RT-PCR (RSV, Flu A&B, Covid) Anterior Nasal Swab     Status: None   Collection Time: 09/18/22  3:54 PM   Specimen: Anterior Nasal Swab  Result Value Ref Range Status   SARS Coronavirus 2 by RT PCR NEGATIVE NEGATIVE Final   Influenza A by PCR NEGATIVE NEGATIVE Final   Influenza B by PCR NEGATIVE NEGATIVE Final    Comment: (NOTE) The Xpert Xpress SARS-CoV-2/FLU/RSV plus assay is intended as an aid in the diagnosis of influenza from Nasopharyngeal swab specimens and should not be used as a sole basis for treatment. Nasal washings and aspirates are unacceptable for Xpert Xpress SARS-CoV-2/FLU/RSV testing.  Fact Sheet for Patients: BloggerCourse.com  Fact Sheet for Healthcare Providers: SeriousBroker.it  This test is not yet approved or cleared by the [place] FDA and has been authorized for detection and/or diagnosis of SARS-CoV-2 by FDA under an Emergency Use Authorization (EUA). This EUA will remain in effect (meaning this test can be used) for the duration of the COVID-19 declaration under Section 564(b)(1) of the Act, 21 U.S.C. section 360bbb-3(b)(1), unless the authorization is terminated or revoked.     Resp Syncytial Virus by PCR NEGATIVE NEGATIVE Final    Comment: (NOTE) Fact Sheet for Patients: BloggerCourse.com  Fact Sheet for Healthcare Providers: SeriousBroker.it  This test is not yet approved or cleared by the [place] FDA and has been authorized for detection and/or diagnosis of SARS-CoV-2 by FDA under an Emergency Use Authorization (EUA). This EUA will  remain in effect (meaning this test can be used) for the duration of the COVID-19 declaration under Section 564(b)(1) of the Act, 21 U.S.C. section 360bbb-3(b)(1), unless the authorization is terminated or revoked.  Performed at [facility], [address], [place], [place] [address]   Blood Culture (routine x 2)     Status: None   Collection Time: 09/18/22  4:37 PM   Specimen: BLOOD LEFT HAND  Result Value Ref Range Status   Specimen Description BLOOD LEFT HAND  Final   Special Requests   Final    BOTTLES DRAWN AEROBIC AND ANAEROBIC Blood Culture adequate volume   Culture   Final    NO GROWTH 5 DAYS Performed at [facility], [address], [place], [place] [address]    Report Status 09/23/2022 FINAL  Final  Blood Culture (routine x 2)     Status: None   Collection Time: 09/18/22  4:37 PM   Specimen: BLOOD RIGHT HAND  Result Value Ref Range Status   Specimen Description BLOOD RIGHT HAND  Final   Special Requests   Final    BOTTLES DRAWN AEROBIC AND ANAEROBIC Blood Culture adequate volume   Culture   Final    NO GROWTH 5 DAYS Performed at [facility], [address], [place], [place] [address]    Report Status 09/23/2022 FINAL  Final  Aerobic/Anaerobic  Culture w Gram Stain (surgical/deep wound)     Status: None (Preliminary result)   Collection Time: 09/19/22  1:23 PM   Specimen: Abscess  Result Value Ref Range Status   Specimen Description ABSCESS ABDOMEN RIGHT  Final   Special Requests DRAIN  Final   Gram Stain   Final    ABUNDANT WBC PRESENT, PREDOMINANTLY PMN MODERATE GRAM NEGATIVE RODS    Culture   Final    ABUNDANT PROTEUS MIRABILIS RARE ENTEROBACTER AEROGENES FEW ENTEROCOCCUS FAECIUM VANCOMYCIN RESISTANT ENTEROCOCCUS ISOLATED HOLDING FOR POSSIBLE ANAEROBE Performed at [facility], [address], [place], [place] [address]    Report Status PENDING  Incomplete   Organism ID, Bacteria PROTEUS MIRABILIS  Final   Organism ID,  Bacteria ENTEROCOCCUS FAECIUM  Final   Organism ID, Bacteria ENTEROBACTER AEROGENES  Final      Susceptibility   Enterobacter aerogenes - MIC*    CEFEPIME 2 SENSITIVE Sensitive     CEFTAZIDIME 4 SENSITIVE Sensitive     CEFTRIAXONE 1 SENSITIVE Sensitive     CIPROFLOXACIN <=0.25 SENSITIVE Sensitive     GENTAMICIN <=1 SENSITIVE Sensitive     IMIPENEM 1 SENSITIVE Sensitive     TRIMETH/SULFA <=20 SENSITIVE Sensitive     PIP/TAZO 64 INTERMEDIATE Intermediate     * RARE ENTEROBACTER AEROGENES   Enterococcus faecium - MIC*    AMPICILLIN >=32 RESISTANT Resistant     VANCOMYCIN >=32 RESISTANT Resistant     GENTAMICIN SYNERGY SENSITIVE Sensitive     * FEW ENTEROCOCCUS FAECIUM   Proteus mirabilis - MIC*    AMPICILLIN <=2 SENSITIVE Sensitive     CEFEPIME <=0.12 SENSITIVE Sensitive     CEFTAZIDIME <=1 SENSITIVE Sensitive     CEFTRIAXONE <=0.25 SENSITIVE Sensitive     CIPROFLOXACIN <=0.25 SENSITIVE Sensitive     GENTAMICIN <=1 SENSITIVE Sensitive     IMIPENEM 4 SENSITIVE Sensitive     TRIMETH/SULFA <=20 SENSITIVE Sensitive     AMPICILLIN/SULBACTAM <=2 SENSITIVE Sensitive     PIP/TAZO <=4 SENSITIVE Sensitive     * ABUNDANT PROTEUS MIRABILIS  Carbapenem Resistance Panel     Status: None   Collection Time: 09/19/22  1:23 PM  Result Value Ref Range Status   Carba Resistance IMP Gene NOT DETECTED NOT DETECTED Final   Carba Resistance VIM Gene NOT DETECTED NOT DETECTED Final   Carba Resistance NDM Gene NOT DETECTED NOT DETECTED Final   Carba Resistance KPC Gene NOT DETECTED NOT DETECTED Final   Carba Resistance OXA48 Gene NOT DETECTED NOT DETECTED Final    Comment: (NOTE) Cepheid Carba-R is an FDA-cleared nucleic acid amplification test  (NAAT)for the detection and differentiation of genes encoding the  most prevalent carbapenemases in bacterial isolate samples. Carbapenemase gene identification and implementation of comprehensive  infection control measures are recommended by the CDC to  prevent the  spread of the resistant organisms. Performed at [facility], [address] [place], [place] [address]   Body fluid culture w Gram Stain     Status: None (Preliminary result)   Collection Time: 09/24/22  5:55 PM   Specimen: JP Drain; Body Fluid  Result Value Ref Range Status   Specimen Description JP DRAINAGE LEFT  Final   Special Requests NONE  Final   Gram Stain NO WBC SEEN NO ORGANISMS SEEN   Final   Culture   Final    NO GROWTH < 12 HOURS Performed at [facility], [address], [place], [place] [address]  Report Status PENDING  Incomplete    Pertinent Lab.    Latest Ref Rng & Units 09/24/2022    6:03 AM 09/22/2022    6:07 AM 09/20/2022    9:40 AM  CBC  WBC 4.0 - 10.5 K/uL 15.8  16.6  28.2   Hemoglobin 13.0 - 17.0 g/dL 7.2  7.1  7.1   Hematocrit 39.0 - 52.0 % 22.4  21.6  21.5   Platelets 150 - 400 K/uL 845  858  807       Latest Ref Rng & Units 09/24/2022    6:03 AM 09/22/2022    6:07 AM 09/20/2022    9:40 AM  CMP  Glucose 70 - 99 mg/dL 81  77  89   BUN 6 - 20 mg/dL <5  <5  6   Creatinine 0.61 - 1.24 mg/dL 1.02  7.25  3.66   Sodium 135 - 145 mmol/L 129  130  130   Potassium 3.5 - 5.1 mmol/L 3.5  3.0  3.3   Chloride 98 - 111 mmol/L 95  96  96   CO2 22 - 32 mmol/L 18  21  21    Calcium 8.9 - 10.3 mg/dL 8.4  8.2  8.2      Pertinent Imaging today Plain films and CT images have been personally visualized and interpreted; radiology reports have been reviewed. Decision making incorporated into the Impression /   No results found.  I have personally spent 50 minutes involved in face-to-face and non-face-to-face activities for this patient on the day of the visit. Professional time spent includes the following activities: Preparing to see the patient (review of tests), Obtaining and/or reviewing separately obtained history (admission/discharge record), Performing a medically appropriate examination and/or evaluation , Ordering  medications/tests/procedures, referring and communicating with other health care professionals, Documenting clinical information in the EMR, Independently interpreting results (not separately reported), Communicating results to the patient/family/caregiver, Counseling and educating the patient/family/caregiver and Care coordination (not separately reported).   Plan d/w requesting provider as well as ID pharm D  Note: This document was prepared using dragon voice recognition software and may include unintentional dictation errors.   Electronically signed by:   Odette Fraction, MD Infectious Disease Physician [facility] Pager: [phone]

## 2022-09-25 NOTE — Progress Notes (Signed)
PROGRESS NOTE    Samuel Blevins  NWG:[id number] DOB: Dec 13, 1965 DOA: 09/18/2022 PCP: No primary care provider on file.   Brief Narrative:  57 year old with history of recurrent pancreatitis, chronic pancreatic insufficiency, HTN , recurrent alcohol use yet again admitted with acute recurrent alcoholic pancreatitis.  Initially patient had left AMA about 4-5 days prior to this admission.  Patient readmitted for acute recurrent alcoholic pancreatitis with worsening ascites, initial labs of ascites fluid indicate elevated amylase concerning for pancreatic duct disruption.     Hospital course complicated with presence of recurrent ascites, infected ascites fluid, ongoing abdominal pain and acute renal failure.  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 Being followed by GI, nephrology, and ID.    GI previously recommending transfer to outside facility for ERCP to attempt to stent pancreatic duct disruption. [facility] accepted patient for procedure on 09/17/2022.  Procedure was apparently difficult procedure with manipulation of the pancreas that ultimately had to be accessed via the gastric wall, stent was successfully placed as planned.  Patient's NG tube was instantly removed by staff during procedure then purposefully removed by patient post-anesthesia.   Patient left our facility AMA on the 24th due to "I have to go get my check" - Fortunately patient did return to our facility later in the evening and was readmitted back to our service.  CT abdomen at intake did show pneumoperitoneum as well as diffuse loculated regions in the abdomen concerning for abscess, IR asked for drain placement.  Dr. Corliss Parish GI at [facility] confirms that pneumoperitoneum is expected given their approach through the gastric wall.  Will advance diet as tolerated with ultimate plans for discharge home (patient is homeless which makes dispo  difficult) with ultimate follow-up at [facility] for repeat ERCP 2 weeks after pancreatic stenting(7/23), IR managing bilateral abdominal drains.  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 Pancreaticogastrostomy stent placed 09/17/2022 at [facility] with Dr. Corliss Parish.  Plan to repeat ERCP at their facility in 2 weeks(first full week of August) to evaluate stent and likely replace with larger diameter stent. - GI here initially recommending repeat paracentesis this week to check amylase levels and evidence of ongoing infection -will collect from his left upper quadrant JP drain given this appears to be draining purely ascites at this time. - IR (Dr Fredia Sorrow) -requires repeat CT abdomen pelvis with contrast for drains can be removed.  Which is done today. -Paracentesis 7/1 - 3L; 7/5 - 3L; 7/8 2L, 7/12 1.7 L. 7/21 600cc -CT 24/25th consistent with loculated abdominal fluid vs abscess -drain placed by IR -Multiple Cortrak's removed by patient -dislodged again intentionally 7/23 at [facility]; will keep NG out; advance diet as tolerated -Cultures (blood and paracentesis) initially negative however cultures on repeat paracentesis from 7/12 shows vancomycin resistant enterococcus; drain growing proteus- on daptomycin/cefepime per ID **(Patient  also had been  diagnosed with Cdiff - currently completed initial course on prophylactic dosing now)   Anemia of chronic disease:   Patient has chart flagged for blood product refusal but he verbally agreed to blood and wants the flag removed. 1u PRBC 7/22 transfused successfully - hgb trending low but no indication of transfusion as of yet.   Thrombocytosis -Questionably reactive in the setting of above infection/anemai.   Hypoalbuminemia Hypovolemic hyponatremia Hypokalemia -Secondary to above, improving with p.o. intake as NG tube has been removed we will not replace it, currently on soft diet.   Recurrent alcohol induced pancreatitis. Nausea vomiting, resolved - Secondary to chronic alcohol use/abuse p.o. intake as tolerated, pain control avoiding IV narcotics, antiemetics, PPI ongoing PRN   Severe protein caloric malnutrition High risk for refeeding syndrome -Complicated by chronic alcohol use   Alcohol abuse - Alcohol withdrawal protocol completed, currently outside the window for any further withdrawal symptoms.    Essential hypertension -Currently well-controlled off medications, as needed hydralazine for hypertensive events   GERD/duodenitis -Continue PPI   Acute kidney injury with hyperkalemia, resolved:    Acute hypoxic respiratory failure, transient and resolved:  Acute episode overnight on the night of 09/05/2022 Chest x-ray reported atelectasis, likely secondary to enlarging ascites. Improved s/p paracentesis   Noncompliance, profound, recurrent Acute metabolic/toxic encephalopathy, resolved -Multiple episodes of abnormal behavior previously, multiple episodes of Cortrak removal, nursing staff noted patient eating cigarettes last week.   -Remains alert and oriented, patient previously left AMA on the 24th but did return later that day.  DVT prophylaxis: enoxaparin (LOVENOX) injection 40 mg Start: 09/18/22 1745   Code Status: Full Code  Family Communication:  None present  at bedside.  Plan of care discussed with patient in length and he/she verbalized understanding and agreed with it.  Status is: Inpatient Remains inpatient appropriate because: Needs to go back to [facility] for replacement of the stent.   Estimated body mass index is 22.27 kg/m as calculated from the following:   Height as of this encounter: 5\' 9"  (1.753 m).   Weight as of this encounter: 68.4 kg.    Nutritional Assessment: Body mass index is 22.27 kg/m.Marland Kitchen Seen by dietician.  I agree with the assessment and plan as outlined below: Nutrition Status:        . Skin Assessment: I have examined the patient's skin and I agree with the wound assessment as performed by the wound care RN as outlined below:    Consultants:  GI, IR  Procedures:  As above  Antimicrobials:  Anti-infectives (From admission, onward)    Start     Dose/Rate Route Frequency Ordered Stop   09/25/22 1000  metroNIDAZOLE (FLAGYL) tablet 500 mg        500 mg Oral Every 12 hours 09/25/22 0906     09/22/22 2000  ceFEPIme (MAXIPIME) 2 g in sodium chloride 0.9 % 100 mL IVPB        2 g 200 mL/hr over 30 Minutes Intravenous Every 8 hours 09/22/22 1908     09/20/22 2200  vancomycin (VANCOCIN) 50 mg/mL oral solution SOLN 125 mg        125 mg Oral Every 12 hours 09/20/22 0909     09/20/22 1000  cefTRIAXone (ROCEPHIN) 2 g in sodium chloride 0.9 % 100 mL IVPB  Status:  Discontinued        2 g 200 mL/hr over 30 Minutes Intravenous Every 24 hours 09/20/22 0909 09/22/22 1858   09/18/22 2300  metroNIDAZOLE (FLAGYL) IVPB 500 mg  Status:  Discontinued        500 mg 100 mL/hr over 60 Minutes Intravenous Every 12 hours 09/18/22 2200 09/19/22 0910   09/18/22 2300  ceFEPIme (MAXIPIME) 2 g in sodium chloride 0.9 % 100 mL IVPB  Status:  Discontinued        2 g 200 mL/hr over 30 Minutes Intravenous Every 8 hours 09/18/22 2210 09/19/22 0910   09/18/22 1815  DAPTOmycin (CUBICIN) 700 mg in sodium chloride 0.9 % IVPB        10 mg/kg   70.7 kg (Adjusted) 128 mL/hr over 30 Minutes Intravenous Daily 09/18/22 1803     09/18/22 1800  vancomycin (VANCOCIN) 50 mg/mL oral solution SOLN 125 mg  Status:  Discontinued        125 mg Oral Every 6 hours 09/18/22 1738 09/20/22 0909         Subjective: Patient seen and examined.  He has no complaints.  Objective: Vitals:   09/24/22 2113 09/25/22 0026 09/25/22 0357 09/25/22 0815  BP: (!) 141/84 (!) 141/89 (!) 140/84 (!) 145/82  Pulse: 100  (!) 108   Resp:   20   Temp: 98.5 F (36.9 C) 98.3 F (36.8 C) 98.3 F (36.8 C) 97.8 F (36.6 C)  TempSrc: Oral Oral  Oral  SpO2: 96% 98% 98%   Weight:      Height:        Intake/Output Summary (Last 24 hours) at 09/25/2022 1045 Last data filed at 09/25/2022 0535 Gross per 24 hour  Intake 10 ml  Output 1308 ml  Net -1298 ml   Filed Weights   09/18/22 1520  Weight: 68.4 kg    Examination:  General exam: Appears calm and comfortable  Respiratory system: Clear to auscultation. Respiratory effort normal. Cardiovascular system: S1 & S2 heard, RRR. No JVD, murmurs, rubs, gallops or clicks. No pedal edema. Gastrointestinal system: Abdomen is mildly stented but soft and nontender and has a couple of JP drains. No organomegaly or masses felt. Normal bowel sounds heard. Central nervous system: Alert and oriented. No focal neurological deficits. Extremities: Symmetric 5 x 5 power. Skin: No rashes, lesions or ulcers  Data Reviewed: I have personally reviewed following labs and imaging studies  CBC: Recent Labs  Lab 09/18/22 1637 09/19/22 0808 09/20/22 0940 09/22/22 0607 09/24/22 0603  WBC 22.5* 25.5* 28.2* 16.6* 15.8*  NEUTROABS 19.1*  --   --   --   --   HGB 7.4* 7.4* 7.1* 7.1* 7.2*  HCT 22.5* 22.2* 21.5* 21.6* 22.4*  MCV 84.9 85.4 84.3 82.4 83.3  PLT 997* 871* 807* 858* 845*   Basic Metabolic Panel: Recent Labs  Lab 09/18/22 1637 09/19/22 0808 09/20/22 0940 09/22/22 0607 09/24/22 0603  NA 133* 131* 130* 130* 129*   K 4.1 3.7 3.3* 3.0* 3.5  CL 99 99 96* 96* 95*  CO2 22 22 21* 21* 18*  GLUCOSE 118* 101* 89 77 81  BUN 16 11 6  <5* <5*  CREATININE 1.18 0.85 0.66 0.55* 0.57*  CALCIUM 8.7* 8.6* 8.2* 8.2* 8.4*   GFR: Estimated Creatinine Clearance: 97.4 mL/min (A) (by C-G formula based on SCr of 0.57 mg/dL (L)). Liver Function Tests: Recent Labs  Lab 09/18/22 1637 09/19/22 0808  AST 60* 35  ALT 53* 35  ALKPHOS 134* 127*  BILITOT 0.5 0.9  PROT 8.6* 7.5  ALBUMIN 2.7* 2.2*   No results for input(s): "LIPASE", "AMYLASE" in the last 168 hours. No results for input(s): "  AMMONIA" in the last 168 hours. Coagulation Profile: Recent Labs  Lab 09/18/22 1637 09/19/22 0808  INR 1.2 1.3*   Cardiac Enzymes: Recent Labs  Lab 09/23/22 0533  CKTOTAL 52   BNP (last 3 results) No results for input(s): "PROBNP" in the last 8760 hours. HbA1C: No results for input(s): "HGBA1C" in the last 72 hours. CBG: No results for input(s): "GLUCAP" in the last 168 hours. Lipid Profile: No results for input(s): "CHOL", "HDL", "LDLCALC", "TRIG", "CHOLHDL", "LDLDIRECT" in the last 72 hours. Thyroid Function Tests: No results for input(s): "TSH", "T4TOTAL", "FREET4", "T3FREE", "THYROIDAB" in the last 72 hours. Anemia Panel: No results for input(s): "VITAMINB12", "FOLATE", "FERRITIN", "TIBC", "IRON", "RETICCTPCT" in the last 72 hours. Sepsis Labs: Recent Labs  Lab 09/18/22 1744  LATICACIDVEN 1.7    Recent Results (from the past 240 hour(s))  Resp panel by RT-PCR (RSV, Flu A&B, Covid) Anterior Nasal Swab     Status: None   Collection Time: 09/18/22  3:54 PM   Specimen: Anterior Nasal Swab  Result Value Ref Range Status   SARS Coronavirus 2 by RT PCR NEGATIVE NEGATIVE Final   Influenza A by PCR NEGATIVE NEGATIVE Final   Influenza B by PCR NEGATIVE NEGATIVE Final    Comment: (NOTE) The Xpert Xpress SARS-CoV-2/FLU/RSV plus assay is intended as an aid in the diagnosis of influenza from Nasopharyngeal swab  specimens and should not be used as a sole basis for treatment. Nasal washings and aspirates are unacceptable for Xpert Xpress SARS-CoV-2/FLU/RSV testing.  Fact Sheet for Patients: BloggerCourse.com  Fact Sheet for Healthcare Providers: SeriousBroker.it  This test is not yet approved or cleared by the [place] FDA and has been authorized for detection and/or diagnosis of SARS-CoV-2 by FDA under an Emergency Use Authorization (EUA). This EUA will remain in effect (meaning this test can be used) for the duration of the COVID-19 declaration under Section 564(b)(1) of the Act, 21 U.S.C. section 360bbb-3(b)(1), unless the authorization is terminated or revoked.     Resp Syncytial Virus by PCR NEGATIVE NEGATIVE Final    Comment: (NOTE) Fact Sheet for Patients: BloggerCourse.com  Fact Sheet for Healthcare Providers: SeriousBroker.it  This test is not yet approved or cleared by the [place] FDA and has been authorized for detection and/or diagnosis of SARS-CoV-2 by FDA under an Emergency Use Authorization (EUA). This EUA will remain in effect (meaning this test can be used) for the duration of the COVID-19 declaration under Section 564(b)(1) of the Act, 21 U.S.C. section 360bbb-3(b)(1), unless the authorization is terminated or revoked.  Performed at [facility], [address] [place], [place] [address]   Blood Culture (routine x 2)     Status: None   Collection Time: 09/18/22  4:37 PM   Specimen: BLOOD LEFT HAND  Result Value Ref Range Status   Specimen Description BLOOD LEFT HAND  Final   Special Requests   Final    BOTTLES DRAWN AEROBIC AND ANAEROBIC Blood Culture adequate volume   Culture   Final    NO GROWTH 5 DAYS Performed at [facility], [address], [place], [place] [address]    Report Status 09/23/2022 FINAL  Final  Blood Culture (routine  x 2)     Status: None   Collection Time: 09/18/22  4:37 PM   Specimen: BLOOD RIGHT HAND  Result Value Ref Range Status   Specimen Description BLOOD RIGHT HAND  Final   Special Requests   Final    BOTTLES DRAWN  AEROBIC AND ANAEROBIC Blood Culture adequate volume   Culture   Final    NO GROWTH 5 DAYS Performed at [facility], [address] [place], [place] [address]    Report Status 09/23/2022 FINAL  Final  Aerobic/Anaerobic Culture w Gram Stain (surgical/deep wound)     Status: None (Preliminary result)   Collection Time: 09/19/22  1:23 PM   Specimen: Abscess  Result Value Ref Range Status   Specimen Description ABSCESS ABDOMEN RIGHT  Final   Special Requests DRAIN  Final   Gram Stain   Final    ABUNDANT WBC PRESENT, PREDOMINANTLY PMN MODERATE GRAM NEGATIVE RODS    Culture   Final    ABUNDANT PROTEUS MIRABILIS RARE ENTEROBACTER AEROGENES FEW ENTEROCOCCUS FAECIUM VANCOMYCIN RESISTANT ENTEROCOCCUS ISOLATED HOLDING FOR POSSIBLE ANAEROBE Performed at [facility], [address] [place], [place] [address]    Report Status PENDING  Incomplete   Organism ID, Bacteria PROTEUS MIRABILIS  Final   Organism ID, Bacteria ENTEROCOCCUS FAECIUM  Final   Organism ID, Bacteria ENTEROBACTER AEROGENES  Final      Susceptibility   Enterobacter aerogenes - MIC*    CEFEPIME 2 SENSITIVE Sensitive     CEFTAZIDIME 4 SENSITIVE Sensitive     CEFTRIAXONE 1 SENSITIVE Sensitive     CIPROFLOXACIN <=0.25 SENSITIVE Sensitive     GENTAMICIN <=1 SENSITIVE Sensitive     IMIPENEM 1 SENSITIVE Sensitive     TRIMETH/SULFA <=20 SENSITIVE Sensitive     PIP/TAZO 64 INTERMEDIATE Intermediate     * RARE ENTEROBACTER AEROGENES   Enterococcus faecium - MIC*    AMPICILLIN >=32 RESISTANT Resistant     VANCOMYCIN >=32 RESISTANT Resistant     GENTAMICIN SYNERGY SENSITIVE Sensitive     * FEW ENTEROCOCCUS FAECIUM   Proteus mirabilis - MIC*    AMPICILLIN <=2 SENSITIVE Sensitive     CEFEPIME <=0.12  SENSITIVE Sensitive     CEFTAZIDIME <=1 SENSITIVE Sensitive     CEFTRIAXONE <=0.25 SENSITIVE Sensitive     CIPROFLOXACIN <=0.25 SENSITIVE Sensitive     GENTAMICIN <=1 SENSITIVE Sensitive     IMIPENEM 4 SENSITIVE Sensitive     TRIMETH/SULFA <=20 SENSITIVE Sensitive     AMPICILLIN/SULBACTAM <=2 SENSITIVE Sensitive     PIP/TAZO <=4 SENSITIVE Sensitive     * ABUNDANT PROTEUS MIRABILIS  Carbapenem Resistance Panel     Status: None   Collection Time: 09/19/22  1:23 PM  Result Value Ref Range Status   Carba Resistance IMP Gene NOT DETECTED NOT DETECTED Final   Carba Resistance VIM Gene NOT DETECTED NOT DETECTED Final   Carba Resistance NDM Gene NOT DETECTED NOT DETECTED Final   Carba Resistance KPC Gene NOT DETECTED NOT DETECTED Final   Carba Resistance OXA48 Gene NOT DETECTED NOT DETECTED Final    Comment: (NOTE) Cepheid Carba-R is an FDA-cleared nucleic acid amplification test  (NAAT)for the detection and differentiation of genes encoding the  most prevalent carbapenemases in bacterial isolate samples. Carbapenemase gene identification and implementation of comprehensive  infection control measures are recommended by the CDC to prevent the  spread of the resistant organisms. Performed at [facility], [address], [place], [place] [address]   Body fluid culture w Gram Stain     Status: None (Preliminary result)   Collection Time: 09/24/22  5:55 PM   Specimen: JP Drain; Body Fluid  Result Value Ref Range Status   Specimen Description JP DRAINAGE LEFT  Final   Special Requests NONE  Final   Gram Stain NO WBC SEEN NO ORGANISMS SEEN   Final   Culture   Final    NO GROWTH < 12 HOURS Performed at [facility], [address], [place], [place] [address]    Report Status PENDING  Incomplete     Radiology Studies: No results found.  Scheduled Meds:  enoxaparin (LOVENOX) injection  40 mg Subcutaneous Q24H   ferrous sulfate  325 mg Oral Daily   folic acid  1 mg Oral  Daily   lipase/protease/amylase  12,000 Units Oral TID AC   magnesium oxide  400 mg Oral Daily   metroNIDAZOLE  500 mg Oral Q12H   multivitamin with minerals  1 tablet Oral Daily   nicotine  14 mg Transdermal Daily   pantoprazole  40 mg Oral BID AC   sodium chloride flush  5 mL Intracatheter Q8H   sucralfate  1 g Oral QID   thiamine  100 mg Oral Daily   vancomycin  125 mg Oral Q12H   Continuous Infusions:  ceFEPime (MAXIPIME) IV 2 g (09/25/22 0428)   DAPTOmycin (CUBICIN) 700 mg in sodium chloride 0.9 % IVPB 700 mg (09/24/22 1405)     LOS: 7 days   Hughie Closs, MD [facility]  09/25/2022, 10:45 AM   *Please note that this is a verbal dictation therefore any spelling or grammatical errors are due to the "Dragon Medical One" system interpretation.  Please page via Amion and do not message via secure chat for urgent patient care matters. Secure chat can be used for non urgent patient care matters.  How to contact the [facility] Attending or Consulting provider 7A - 7P or covering provider during after hours 7P -7A, for this patient?  Check the care team in [facility] and look for a) attending/consulting TRH provider listed and b) the [facility] team listed. Page or secure chat 7A-7P. Log into [email or web address] and use [place]'s universal password to access. If you do not have the password, please contact the hospital operator. Locate the [facility] provider you are looking for under [facility] and page to a number that you can be directly reached. If you still have difficulty reaching the provider, please page the [facility] (Director on Call) for the Hospitalists listed on amion for assistance.

## 2022-09-25 NOTE — Plan of Care (Signed)
Patient is being followed by IR for RUQ/LLQ drains. Output has been tending down, both less than 10 mL/day documented overnight.   CT A/P with IV contrast only ordered.  Will review once performed.    Lynann Bologna Bliss Behnke PA-C 09/25/2022 8:37 AM

## 2022-09-26 DIAGNOSIS — K652 Spontaneous bacterial peritonitis: Secondary | ICD-10-CM | POA: Diagnosis not present

## 2022-09-26 LAB — TYPE AND SCREEN
ABO/RH(D): A POS
Antibody Screen: NEGATIVE
Unit division: 0

## 2022-09-26 LAB — PREPARE RBC (CROSSMATCH)

## 2022-09-26 LAB — BPAM RBC
Blood Product Expiration Date: 202408242359
ISSUE DATE / TIME: 202408011643
Unit Type and Rh: 6200

## 2022-09-26 MED ORDER — POTASSIUM CHLORIDE CRYS ER 20 MEQ PO TBCR
40.0000 meq | EXTENDED_RELEASE_TABLET | ORAL | Status: AC
  Administered 2022-09-26 (×3): 40 meq via ORAL
  Filled 2022-09-26: qty 2
  Filled 2022-09-26: qty 4
  Filled 2022-09-26: qty 2

## 2022-09-26 MED ORDER — SODIUM CHLORIDE 0.9% IV SOLUTION: Freq: Once | INTRAVENOUS | Status: AC

## 2022-09-26 NOTE — Progress Notes (Signed)
IVT consult placed for additional IV access. Patient has 1 PIV currently but is receiving multiple IV abx and has an order for a blood transfusion. Secure chat sent to ID provider to inquire about tentative plan. Discussed with primary RN. Secure chat sent to Dr. Jacqulyn Bath requesting PICC order given needs and patient acuity.   Tyishia Aune Loyola Mast, RN

## 2022-09-26 NOTE — Plan of Care (Signed)
Plan of care is reviewed. Pt has been progressing.   Problem: Clinical Measurements: Recurrent alcoholic pancreatitis. Dx. pseudocyst of pancreas due to acute pancreatitis and liver cirrhosis with ascites. S/p biliary stent placement with two abdominal JP drains in place. No active drainage.  Goal: Will remain free from infection Outcome: Progressing: Pt has been afebrile. Pt has been treated with Cefepime, Daptomycin, Metronidazole and vancomycin by infectious disease team. Pt remains afebrile, stable hemodynamically.    Problem: Activity: He is able to ambulate in his room to the bathroom or bedside commode independently. Goal: Risk for activity intolerance will decrease Outcome: Progressing ambulation.   Problem: Nutrition: on soft diet. Low appetite due to abdominal pain and discomfort from abscess abdominal cavity.  Goal: Adequate nutrition will be maintained Outcome: Progressing: advanced diet as tolerated. Pt gradually increases his appetite. No nausea or vomiting.    Problem: Elimination: Diarrhea, C-Diff positive. Goal: Will not experience complications related to bowel motility Outcome: Progressing: loose stool x 1 tonight.    Problem: Elimination: urgent frequent urination.  Goal: Will not experience complications related to urinary retention Outcome: Progressing: On PreMo Fit for urine management.    Problem: Pain Managment: abdominal pain, generalized pain. Goal: General experience of comfort will improve Outcome: Progressing: Pt is able to rest well. Tramadol q 6 hrs PRN for pain.   Filiberto Pinks, RN

## 2022-09-26 NOTE — Progress Notes (Signed)
PROGRESS NOTE    Samuel Blevins  RUE:[id number] DOB: March 19, 1965 DOA: 09/18/2022 PCP: No primary care provider on file.   Brief Narrative:  57 year old with history of recurrent pancreatitis, chronic pancreatic insufficiency, HTN , recurrent alcohol use yet again admitted with acute recurrent alcoholic pancreatitis.  Initially patient had left AMA about 4-5 days prior to this admission.  Patient readmitted for acute recurrent alcoholic pancreatitis with worsening ascites, initial labs of ascites fluid indicate elevated amylase concerning for pancreatic duct disruption.     Hospital course complicated with presence of recurrent ascites, infected ascites fluid, ongoing abdominal pain and acute renal failure.  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 Being followed by GI, nephrology, and ID.    GI previously recommending transfer to outside facility for ERCP to attempt to stent pancreatic duct disruption. [facility] accepted patient for procedure on 09/17/2022.  Procedure was apparently difficult procedure with manipulation of the pancreas that ultimately had to be accessed via the gastric wall, stent was successfully placed as planned.  Patient's NG tube was instantly removed by staff during procedure then purposefully removed by patient post-anesthesia.   Patient left our facility AMA on the 24th due to "I have to go get my check" - Fortunately patient did return to our facility later in the evening and was readmitted back to our service.  CT abdomen at intake did show pneumoperitoneum as well as diffuse loculated regions in the abdomen concerning for abscess, IR asked for drain placement.  Dr. Corliss Parish GI at [facility] confirms that pneumoperitoneum is expected given their approach through the gastric wall.  Will advance diet as tolerated with ultimate plans for discharge home (patient is homeless which makes dispo  difficult) with ultimate follow-up at [facility] for repeat ERCP 2 weeks after pancreatic stenting(7/23), IR managing bilateral abdominal drains.  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 Pancreaticogastrostomy stent placed 09/17/2022 at [facility] with Dr. Corliss Parish.  Plan to repeat ERCP at their facility in 2 weeks(first full week of August) to evaluate stent and likely replace with larger diameter stent. - GI here initially recommending repeat paracentesis this week to check amylase levels and evidence of ongoing infection -will collect from his left upper quadrant JP drain given this appears to be draining purely ascites at this time. - IR (Dr Fredia Sorrow) -requires repeat CT abdomen pelvis with contrast for drains can be removed.  Which was done 09/25/2022, and shows no fluid collection at the place of catheter so her catheter is pulled out by IR on 09/26/2022. -Paracentesis 7/1 - 3L; 7/5 - 3L; 7/8 2L, 7/12 1.7 L. 7/21 600cc, -CT 24/25th consistent with loculated abdominal fluid vs abscess -drain placed by IR -Multiple Cortrak's removed by patient -dislodged again intentionally 7/23 at [facility];  -Cultures (blood and paracentesis) initially negative however cultures on repeat paracentesis from  7/12 shows vancomycin resistant enterococcus; drain  growing proteus- on daptomycin/cefepime per ID **(Patient also had been diagnosed with Cdiff - currently completed initial course but on prophylactic dosing now) It looks like GI had signed off on 09/20/2022.  I have tried to reach out to [facility] to find out when he is a scheduled for next procedure over there.  Waiting to hear back from them.  If it is far out several days from now, we may plan to discharge him home on oral antibiotics and follow-up with [facility] GI.   Anemia of chronic disease:   Patient has chart flagged for blood product refusal but he verbally agreed to blood and wants the flag removed. 1u PRBC 7/22 transfused successfully -hemoglobin improved but trending down and currently 7.0 so I will transfuse him 1 more unit today.   Thrombocytosis -Questionably reactive in the setting of above infection/anemai.   Hypoalbuminemia Hypovolemic hyponatremia Hypokalemia -Secondary to above, improving with p.o. intake as NG tube has been removed we will not replace it, currently on soft diet.   Recurrent alcohol induced pancreatitis. Nausea vomiting, resolved - Secondary to chronic alcohol use/abuse p.o. intake as tolerated, pain control avoiding IV narcotics, antiemetics, PPI ongoing PRN   Severe protein caloric malnutrition High risk for refeeding syndrome -Complicated by chronic alcohol use   Alcohol abuse - Alcohol withdrawal protocol completed, currently outside the window for any further withdrawal symptoms.    Essential hypertension -Currently well-controlled off medications, as needed hydralazine for hypertensive events   GERD/duodenitis -Continue PPI   Acute kidney injury with hyperkalemia, resolved:    Acute hypoxic respiratory failure, transient and resolved:  Acute episode overnight on the night of 09/05/2022 Chest x-ray reported atelectasis, likely secondary to enlarging ascites. Improved s/p paracentesis   Noncompliance, profound, recurrent Acute metabolic/toxic  encephalopathy, resolved -Multiple episodes of abnormal behavior previously, multiple episodes of Cortrak removal, nursing staff noted patient eating cigarettes last week.   -Remains alert and oriented, patient previously left AMA on the 24th but did return later that day.  DVT prophylaxis: enoxaparin (LOVENOX) injection 40 mg Start: 09/18/22 1745   Code Status: Full Code  Family Communication:  None present at bedside.  Plan of care discussed with patient in length and he/she verbalized understanding and agreed with it.  Status is: Inpatient Remains inpatient appropriate because: Needs to go back to [facility] for replacement of the stent.   Estimated body mass index is 22.27 kg/m as calculated from the following:   Height as of this encounter: 5\' 9"  (1.753 m).   Weight as of this encounter: 68.4 kg.    Nutritional Assessment: Body mass index is 22.27 kg/m.Marland Kitchen Seen by dietician.  I agree with the assessment and plan as outlined below: Nutrition Status:        . Skin Assessment: I have examined the patient's skin and I agree with the wound assessment as performed by the wound care RN as outlined below:    Consultants:  GI, IR  Procedures:  As above  Antimicrobials:  Anti-infectives (From admission, onward)    Start     Dose/Rate Route Frequency Ordered Stop   09/25/22 1000  metroNIDAZOLE (FLAGYL) tablet 500 mg        500 mg Oral Every 12 hours 09/25/22 0906     09/22/22 2000  ceFEPIme (MAXIPIME) 2 g in sodium chloride 0.9 % 100 mL IVPB        2 g 200 mL/hr over 30 Minutes Intravenous Every 8 hours  09/22/22 1908     09/20/22 2200  vancomycin (VANCOCIN) 50 mg/mL oral solution SOLN 125 mg        125 mg Oral Every 12 hours 09/20/22 0909     09/20/22 1000  cefTRIAXone (ROCEPHIN) 2 g in sodium chloride 0.9 % 100 mL IVPB  Status:  Discontinued        2 g 200 mL/hr over 30 Minutes Intravenous Every 24 hours 09/20/22 0909 09/22/22 1858   09/18/22 2300  metroNIDAZOLE (FLAGYL) IVPB  500 mg  Status:  Discontinued        500 mg 100 mL/hr over 60 Minutes Intravenous Every 12 hours 09/18/22 2200 09/19/22 0910   09/18/22 2300  ceFEPIme (MAXIPIME) 2 g in sodium chloride 0.9 % 100 mL IVPB  Status:  Discontinued        2 g 200 mL/hr over 30 Minutes Intravenous Every 8 hours 09/18/22 2210 09/19/22 0910   09/18/22 1815  DAPTOmycin (CUBICIN) 700 mg in sodium chloride 0.9 % IVPB        10 mg/kg  70.7 kg (Adjusted) 128 mL/hr over 30 Minutes Intravenous Daily 09/18/22 1803     09/18/22 1800  vancomycin (VANCOCIN) 50 mg/mL oral solution SOLN 125 mg  Status:  Discontinued        125 mg Oral Every 6 hours 09/18/22 1738 09/20/22 0909         Subjective: Seen and examined.  He has no complaints.  He is eager to know what the plans are and if he can go home.  I informed him that I am going to touch base with [facility] and will let him know as soon as I know.  Objective: Vitals:   09/25/22 1958 09/26/22 0021 09/26/22 0417 09/26/22 0721  BP: (!) 130/91 116/73 125/78 125/79  Pulse: (!) 106 (!) 110 (!) 105 (!) 105  Resp: 20 20 20 19   Temp: 98.5 F (36.9 C) 98 F (36.7 C) 98.7 F (37.1 C) 98.5 F (36.9 C)  TempSrc: Oral Oral Oral Oral  SpO2: 99% 97% 99% 99%  Weight:      Height:        Intake/Output Summary (Last 24 hours) at 09/26/2022 0752 Last data filed at 09/26/2022 0436 Gross per 24 hour  Intake 1628.96 ml  Output 1533 ml  Net 95.96 ml   Filed Weights   09/18/22 1520  Weight: 68.4 kg    Examination:  General exam: Appears calm and comfortable  Respiratory system: Clear to auscultation. Respiratory effort normal. Cardiovascular system: S1 & S2 heard, RRR. No JVD, murmurs, rubs, gallops or clicks. No pedal edema. Gastrointestinal system: Abdomen is nondistended, soft and nontender. No organomegaly or masses felt. Normal bowel sounds heard. Central nervous system: Alert and oriented. No focal neurological deficits. Extremities: Symmetric 5 x 5 power. Skin: No rashes,  lesions or ulcers.   Data Reviewed: I have personally reviewed following labs and imaging studies  CBC: Recent Labs  Lab 09/19/22 0808 09/20/22 0940 09/22/22 0607 09/24/22 0603 09/26/22 0546  WBC 25.5* 28.2* 16.6* 15.8* 10.5  HGB 7.4* 7.1* 7.1* 7.2* 7.0*  HCT 22.2* 21.5* 21.6* 22.4* 21.2*  MCV 85.4 84.3 82.4 83.3 84.1  PLT 871* 807* 858* 845* 779*   Basic Metabolic Panel: Recent Labs  Lab 09/19/22 0808 09/20/22 0940 09/22/22 0607 09/24/22 0603 09/26/22 0546  NA 131* 130* 130* 129* 131*  K 3.7 3.3* 3.0* 3.5 2.9*  CL 99 96* 96* 95* 96*  CO2 22 21* 21* 18* 18*  GLUCOSE 101* 89 77 81 102*  BUN 11 6 <5* <5* <5*  CREATININE 0.85 0.66 0.55* 0.57* 0.54*  CALCIUM 8.6* 8.2* 8.2* 8.4* 8.6*   GFR: Estimated Creatinine Clearance: 97.4 mL/min (A) (by C-G formula based on SCr of 0.54 mg/dL (L)). Liver Function Tests: Recent Labs  Lab 09/19/22 0808  AST 35  ALT 35  ALKPHOS 127*  BILITOT 0.9  PROT 7.5  ALBUMIN 2.2*   No results for input(s): "LIPASE", "AMYLASE" in the last 168 hours. No results for input(s): "AMMONIA" in the last 168 hours. Coagulation Profile: Recent Labs  Lab 09/19/22 0808  INR 1.3*   Cardiac Enzymes: Recent Labs  Lab 09/23/22 0533  CKTOTAL 52   BNP (last 3 results) No results for input(s): "PROBNP" in the last 8760 hours. HbA1C: No results for input(s): "HGBA1C" in the last 72 hours. CBG: No results for input(s): "GLUCAP" in the last 168 hours. Lipid Profile: No results for input(s): "CHOL", "HDL", "LDLCALC", "TRIG", "CHOLHDL", "LDLDIRECT" in the last 72 hours. Thyroid Function Tests: No results for input(s): "TSH", "T4TOTAL", "FREET4", "T3FREE", "THYROIDAB" in the last 72 hours. Anemia Panel: No results for input(s): "VITAMINB12", "FOLATE", "FERRITIN", "TIBC", "IRON", "RETICCTPCT" in the last 72 hours. Sepsis Labs: No results for input(s): "PROCALCITON", "LATICACIDVEN" in the last 168 hours.   Recent Results (from the past 240 hour(s))   Resp panel by RT-PCR (RSV, Flu A&B, Covid) Anterior Nasal Swab     Status: None   Collection Time: 09/18/22  3:54 PM   Specimen: Anterior Nasal Swab  Result Value Ref Range Status   SARS Coronavirus 2 by RT PCR NEGATIVE NEGATIVE Final   Influenza A by PCR NEGATIVE NEGATIVE Final   Influenza B by PCR NEGATIVE NEGATIVE Final    Comment: (NOTE) The Xpert Xpress SARS-CoV-2/FLU/RSV plus assay is intended as an aid in the diagnosis of influenza from Nasopharyngeal swab specimens and should not be used as a sole basis for treatment. Nasal washings and aspirates are unacceptable for Xpert Xpress SARS-CoV-2/FLU/RSV testing.  Fact Sheet for Patients: BloggerCourse.com  Fact Sheet for Healthcare Providers: SeriousBroker.it  This test is not yet approved or cleared by the [place] FDA and has been authorized for detection and/or diagnosis of SARS-CoV-2 by FDA under an Emergency Use Authorization (EUA). This EUA will remain in effect (meaning this test can be used) for the duration of the COVID-19 declaration under Section 564(b)(1) of the Act, 21 U.S.C. section 360bbb-3(b)(1), unless the authorization is terminated or revoked.     Resp Syncytial Virus by PCR NEGATIVE NEGATIVE Final    Comment: (NOTE) Fact Sheet for Patients: BloggerCourse.com  Fact Sheet for Healthcare Providers: SeriousBroker.it  This test is not yet approved or cleared by the [place] FDA and has been authorized for detection and/or diagnosis of SARS-CoV-2 by FDA under an Emergency Use Authorization (EUA). This EUA will remain in effect (meaning this test can be used) for the duration of the COVID-19 declaration under Section 564(b)(1) of the Act, 21 U.S.C. section 360bbb-3(b)(1), unless the authorization is terminated or revoked.  Performed at [facility], [address], [place],  [place] [address]   Blood Culture (routine x 2)     Status: None   Collection Time: 09/18/22  4:37 PM   Specimen: BLOOD LEFT HAND  Result Value Ref Range Status   Specimen Description BLOOD LEFT HAND  Final   Special Requests   Final    BOTTLES DRAWN AEROBIC AND ANAEROBIC Blood Culture adequate volume  Culture   Final    NO GROWTH 5 DAYS Performed at [facility], [address] [place], [place] [address]    Report Status 09/23/2022 FINAL  Final  Blood Culture (routine x 2)     Status: None   Collection Time: 09/18/22  4:37 PM   Specimen: BLOOD RIGHT HAND  Result Value Ref Range Status   Specimen Description BLOOD RIGHT HAND  Final   Special Requests   Final    BOTTLES DRAWN AEROBIC AND ANAEROBIC Blood Culture adequate volume   Culture   Final    NO GROWTH 5 DAYS Performed at [facility], [address], [place], [place] [address]    Report Status 09/23/2022 FINAL  Final  Aerobic/Anaerobic Culture w Gram Stain (surgical/deep wound)     Status: None   Collection Time: 09/19/22  1:23 PM   Specimen: Abscess  Result Value Ref Range Status   Specimen Description ABSCESS ABDOMEN RIGHT  Final   Special Requests DRAIN  Final   Gram Stain   Final    ABUNDANT WBC PRESENT, PREDOMINANTLY PMN MODERATE GRAM NEGATIVE RODS    Culture   Final    ABUNDANT PROTEUS MIRABILIS RARE ENTEROBACTER AEROGENES FEW ENTEROCOCCUS FAECIUM VANCOMYCIN RESISTANT ENTEROCOCCUS ISOLATED ABUNDANT FUSOBACTERIUM NUCLEATUM BETA LACTAMASE NEGATIVE Performed at [facility], [address], [place], [place] [address]    Report Status 09/25/2022 FINAL  Final   Organism ID, Bacteria PROTEUS MIRABILIS  Final   Organism ID, Bacteria ENTEROCOCCUS FAECIUM  Final   Organism ID, Bacteria ENTEROBACTER AEROGENES  Final      Susceptibility   Enterobacter aerogenes - MIC*    CEFEPIME 2 SENSITIVE Sensitive     CEFTAZIDIME 4 SENSITIVE Sensitive     CEFTRIAXONE 1 SENSITIVE Sensitive     CIPROFLOXACIN <=0.25  SENSITIVE Sensitive     GENTAMICIN <=1 SENSITIVE Sensitive     IMIPENEM 1 SENSITIVE Sensitive     TRIMETH/SULFA <=20 SENSITIVE Sensitive     PIP/TAZO 64 INTERMEDIATE Intermediate     * RARE ENTEROBACTER AEROGENES   Enterococcus faecium - MIC*    AMPICILLIN >=32 RESISTANT Resistant     VANCOMYCIN >=32 RESISTANT Resistant     GENTAMICIN SYNERGY SENSITIVE Sensitive     * FEW ENTEROCOCCUS FAECIUM   Proteus mirabilis - MIC*    AMPICILLIN <=2 SENSITIVE Sensitive     CEFEPIME <=0.12 SENSITIVE Sensitive     CEFTAZIDIME <=1 SENSITIVE Sensitive     CEFTRIAXONE <=0.25 SENSITIVE Sensitive     CIPROFLOXACIN <=0.25 SENSITIVE Sensitive     GENTAMICIN <=1 SENSITIVE Sensitive     IMIPENEM 4 SENSITIVE Sensitive     TRIMETH/SULFA <=20 SENSITIVE Sensitive     AMPICILLIN/SULBACTAM <=2 SENSITIVE Sensitive     PIP/TAZO <=4 SENSITIVE Sensitive     * ABUNDANT PROTEUS MIRABILIS  Carbapenem Resistance Panel     Status: None   Collection Time: 09/19/22  1:23 PM  Result Value Ref Range Status   Carba Resistance IMP Gene NOT DETECTED NOT DETECTED Final   Carba Resistance VIM Gene NOT DETECTED NOT DETECTED Final   Carba Resistance NDM Gene NOT DETECTED NOT DETECTED Final   Carba Resistance KPC Gene NOT DETECTED NOT DETECTED Final   Carba Resistance OXA48 Gene NOT DETECTED NOT DETECTED Final    Comment: (NOTE) Cepheid Carba-R is an FDA-cleared nucleic acid amplification test  (NAAT)for the detection and differentiation of genes encoding the  most prevalent carbapenemases in bacterial isolate samples. Carbapenemase gene  identification and implementation of comprehensive  infection control measures are recommended by the CDC to prevent the  spread of the resistant organisms. Performed at [facility], [address], [place], [place] [address]   Body fluid culture w Gram Stain     Status: None (Preliminary result)   Collection Time: 09/24/22  5:55 PM   Specimen: JP Drain; Body Fluid  Result Value Ref  Range Status   Specimen Description JP DRAINAGE LEFT  Final   Special Requests NONE  Final   Gram Stain NO WBC SEEN NO ORGANISMS SEEN   Final   Culture   Final    NO GROWTH < 12 HOURS Performed at [facility], [address] [place], [place] [address]    Report Status PENDING  Incomplete     Radiology Studies: No results found.  Scheduled Meds:  sodium chloride   Intravenous Once   enoxaparin (LOVENOX) injection  40 mg Subcutaneous Q24H   ferrous sulfate  325 mg Oral Daily   folic acid  1 mg Oral Daily   lipase/protease/amylase  12,000 Units Oral TID AC   magnesium oxide  400 mg Oral Daily   metroNIDAZOLE  500 mg Oral Q12H   multivitamin with minerals  1 tablet Oral Daily   nicotine  14 mg Transdermal Daily   pantoprazole  40 mg Oral BID AC   potassium chloride  40 mEq Oral Q4H   sodium chloride flush  5 mL Intracatheter Q8H   sucralfate  1 g Oral QID   thiamine  100 mg Oral Daily   vancomycin  125 mg Oral Q12H   Continuous Infusions:  ceFEPime (MAXIPIME) IV 2 g (09/26/22 0415)   DAPTOmycin (CUBICIN) 700 mg in sodium chloride 0.9 % IVPB 700 mg (09/25/22 1426)     LOS: 8 days   Hughie Closs, MD [facility]  09/26/2022, 7:52 AM   *Please note that this is a verbal dictation therefore any spelling or grammatical errors are due to the "Dragon Medical One" system interpretation.  Please page via Amion and do not message via secure chat for urgent patient care matters. Secure chat can be used for non urgent patient care matters.  How to contact the [facility] Attending or Consulting provider 7A - 7P or covering provider during after hours 7P -7A, for this patient?  Check the care team in [facility] and look for a) attending/consulting TRH provider listed and b) the [facility] team listed. Page or secure chat 7A-7P. Log into [email or web address] and use [place]'s universal password to access. If you do not have the password, please contact the hospital operator. Locate the [facility]  provider you are looking for under [facility] and page to a number that you can be directly reached. If you still have difficulty reaching the provider, please page the [facility] (Director on Call) for the Hospitalists listed on amion for assistance.

## 2022-09-26 NOTE — Progress Notes (Signed)
Referring Physician(s): Eliot Ford CCS   Supervising Physician: Irish Lack  Patient Status:  [facility] - In-pt  Chief Complaint:  Acute recurrent alcoholic pancreatitis  CT showed RUQ and LLQ fluid collections  S/p aspiration and drain placement by Dr. Milford Cage on 09/19/22   Subjective:  Laying in bed, NAD, reports that he is doing fine.  Informed that the drain will be removed today, patient gave verbal consent.  States that he is not sure when he will be discharged, supposed to go to [facility] for "they are going to remove something in my neck, I am not sure what they are talking about."   Allergies: Patient has no known allergies.  Medications: Prior to Admission medications   Medication Sig Start Date End Date Taking? Authorizing Provider  amLODipine (NORVASC) 5 MG tablet Take 5 mg by mouth daily.   Yes [provider]  carvedilol (COREG) 3.125 MG tablet Take 1 tablet (3.125 mg total) by mouth 2 (two) times daily with a meal. 07/10/22  Yes [name], MD  folic acid (FOLVITE) 1 MG tablet Take 1 tablet (1 mg total) by mouth daily. 07/14/22  Yes Rolly Salter, MD  hydrOXYzine (ATARAX) 25 MG tablet Take 25 mg by mouth at bedtime as needed for anxiety.   Yes [provider]  Iron, Ferrous Sulfate, 325 (65 Fe) MG TABS Take 1 tablet ( 325 mg) by mouth daily. 05/24/22  Yes [name], MD  lipase/protease/amylase (CREON) 12000-38000 units CPEP capsule Take 1 capsule (12,000 Units total) by mouth 3 (three) times daily before meals. 07/14/22  Yes Rolly Salter, MD  magnesium oxide (MAG-OX) 400 MG tablet Take 1 tablet (400 mg total) by mouth in the morning and at bedtime. 07/14/22  Yes Rolly Salter, MD  Multiple Vitamin (MULTIVITAMIN WITH MINERALS) TABS tablet Take 1 tablet by mouth daily. 07/14/22  Yes Rolly Salter, MD  nicotine (NICODERM CQ - DOSED IN MG/24 HOURS) 14 mg/24hr patch Place 1 patch (14 mg total) onto the skin daily. 07/14/22  Yes Rolly Salter,  MD  ondansetron (ZOFRAN) 8 MG tablet Take 8 mg by mouth every 6 (six) hours as needed for nausea or vomiting.   Yes [provider]  pantoprazole (PROTONIX) 40 MG tablet Take 1 tablet (40 mg total) by mouth 2 (two) times daily before a meal. 07/14/22  Yes Rolly Salter, MD  polyethylene glycol (MIRALAX / GLYCOLAX) 17 g packet Take 17 g by mouth daily as needed for mild constipation or moderate constipation. 07/14/22  Yes Rolly Salter, MD  sucralfate (CARAFATE) 1 g tablet Take 1 g by mouth See admin instructions. Take 1 tablet by mouth 4 times daily with meals and bedtime   Yes [provider]  thiamine (VITAMIN B-1) 100 MG tablet Take 1 tablet (100 mg total) by mouth daily. 07/14/22  Yes Rolly Salter, MD  traMADol (ULTRAM) 50 MG tablet Take 1 tablet (50 mg total) by mouth every 8 (eight) hours as needed for severe pain. Patient not taking: Reported on 09/18/2022 07/17/22   [name], MD     Vital Signs: BP 125/79 (BP Location: Left Arm)   Pulse (!) 105   Temp 98.5 F (36.9 C) (Oral)   Resp 19   Ht 5\' 9"  (1.753 m)   Wt 150 lb 12.7 oz (68.4 kg)   SpO2 99%   BMI 22.27 kg/m   Physical Exam Vitals reviewed.  Constitutional:  General: He is not in acute distress.    Appearance: He is not ill-appearing.  HENT:     Head: Normocephalic.  Eyes:     Extraocular Movements: Extraocular movements intact.  Pulmonary:     Effort: Pulmonary effort is normal.  Abdominal:     General: Abdomen is flat.     Palpations: Abdomen is soft.     Comments: Positive RLQ drain to a suction bulb. Site is unremarkable with no erythema, edema, tenderness, bleeding or drainage. Suture and stat lock in place. Dressing is clean, dry, and intact. Trace tan colored fluid noted in the bulb.   Positive LLQ drain to a suction bulb. Site is unremarkable with no erythema, edema, tenderness, bleeding or drainage. Suture and stat lock in place. Dressing is clean, dry, and intact. Trace  simple serous fluid in the bulb.    Musculoskeletal:     Cervical back: Neck supple.  Neurological:     Mental Status: He is alert.  Psychiatric:        Mood and Affect: Mood normal.        Behavior: Behavior normal.     Imaging: CT ABDOMEN PELVIS W CONTRAST  Result Date: 09/26/2022 CLINICAL DATA:  History of chronic pancreatitis and status post placement percutaneous drainage catheters in to right-sided and left-sided loculated abdominal fluid collections on 09/19/2022. EXAM: CT ABDOMEN AND PELVIS WITH CONTRAST TECHNIQUE: Multidetector CT imaging of the abdomen and pelvis was performed using the standard protocol following bolus administration of intravenous contrast. RADIATION DOSE REDUCTION: This exam was performed according to the departmental dose-optimization program which includes automated exposure control, adjustment of the mA and/or kV according to patient size and/or use of iterative reconstruction technique. CONTRAST:  75mL OMNIPAQUE IOHEXOL 350 MG/ML SOLN COMPARISON:  09/19/2022 and 09/18/2022. FINDINGS: Lower chest: Small right pleural effusion with right basilar atelectasis/airspace disease. Hepatobiliary: No focal liver abnormality is seen. No gallstones, gallbladder wall thickening, or biliary dilatation. Reduced amount of free fluid adjacent to the dome of the liver. Pancreas: Normal pancreatic enhancement without evidence of necrosis, pancreatic mass, calcifications or pancreatic ductal dilatation. 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 No signs of pseudocyst infection. The pancreatic ductal stent that extends from the duodenum through the head and body of the pancreas and all  the way to the level of the stomach shows stable positioning. Spleen: Normal in size without focal abnormality. Adrenals/Urinary Tract: Adrenal glands are unremarkable. Kidneys are normal, without renal calculi, focal lesion, or hydronephrosis. Bladder is unremarkable. Stomach/Bowel: Bowel shows no evidence of obstruction, ileus, inflammation or lesion. The appendix is not discretely visualized. No free intraperitoneal air. Vascular/Lymphatic: Stable atherosclerosis of the abdominal aorta without aneurysm. No enlarged lymph nodes identified. Reproductive: Prostate is unremarkable. Other: Indwelling right-sided percutaneous abdominal drainage catheter along the inferior margin of the liver and left-sided peritoneal drainage catheter in the anterior left lower quadrant remain with no residual fluid collections at the level of the catheters. No new fluid collections identified. Musculoskeletal: No acute or significant osseous findings. IMPRESSION: 1. Further maturation and slight enlargement of the dominant pseudocyst adjacent to the anterior body and tail of the pancreas and extending into the lesser sac. No signs of pseudocyst infection. 2. Stable  positioning of pancreatic ductal stent. 3. Indwelling right-sided percutaneous abdominal drainage catheter along the inferior margin of the liver and left-sided peritoneal drainage catheter in the anterior left lower quadrant remain with no residual fluid collections at the level of the catheters. No new fluid collections identified. 4. Decrease in free fluid adjacent to the dome of the liver. 5. Small right pleural effusion with right basilar atelectasis/airspace disease. 6. Aortic atherosclerosis. Electronically Signed   By: Irish Lack M.D.   On: 09/26/2022 09:12    Labs:  CBC: Recent Labs    09/20/22 0940 09/22/22 0607 09/24/22 0603 09/26/22 0546  WBC 28.2* 16.6* 15.8* 10.5  HGB 7.1* 7.1* 7.2* 7.0*  HCT 21.5* 21.6* 22.4* 21.2*  PLT 807* 858* 845* 779*     COAGS: Recent Labs    06/13/22 2310 08/17/22 2040 08/20/22 1200 08/26/22 1002 09/17/22 0617 09/18/22 0712 09/18/22 1637 09/19/22 0808  INR 1.1 1.1   < > 1.2 1.2 1.2 1.2 1.3*  APTT 37* 36  --  34  --   --  35  --    < > = values in this interval not displayed.    BMP: Recent Labs    09/20/22 0940 09/22/22 0607 09/24/22 0603 09/26/22 0546  NA 130* 130* 129* 131*  K 3.3* 3.0* 3.5 2.9*  CL 96* 96* 95* 96*  CO2 21* 21* 18* 18*  GLUCOSE 89 77 81 102*  BUN 6 <5* <5* <5*  CALCIUM 8.2* 8.2* 8.4* 8.6*  CREATININE 0.66 0.55* 0.57* 0.54*  GFRNONAA >60 >60 >60 >60    LIVER FUNCTION TESTS: Recent Labs    09/17/22 0617 09/18/22 0712 09/18/22 1637 09/19/22 0808  BILITOT 0.6 0.6 0.5 0.9  AST 61* 76* 60* 35  ALT 40 57* 53* 35  ALKPHOS 156* 136* 134* 127*  PROT 8.2* 8.2* 8.6* 7.5  ALBUMIN 2.3* 2.5* 2.7* 2.2*    Assessment and Plan:  57 y.o. male with recurrent pancreatitis, chronic pancreatic insufficiency, HTN , recurrent ascites s/p multiple para, admitted on 7/24 due to acute recurrent alcoholic pancreatitis,  CT showed RUQ and LLQ fluid collections, s/p RUQ and LLQ drain placement by Dr. Milford Cage on 09/19/22.   Output has been low, f/u CT A/P ordered, reviewed with Dr. Fredia Sorrow this morning, OK to remove both drain.   Both drain removed w/o difficulty, dressing placed.  Patient to keep the RLQ/LLQ insertion site dry and clean until fully heals, can take showed tomorrow but no submerging for 7 days.  RN/ MD notified that drains are removed.   IR will sign off, please call for questions and concerns.   Electronically Signed: Willette Brace, PA-C 09/26/2022, 9:24 AM   I spent a total of 15 Minutes at the the patient's bedside AND on the patient's hospital floor or unit, greater than 50% of which was counseling/coordinating care for drain f/u.   This chart was dictated using voice recognition software.  Despite best efforts to proofread,  errors can occur which can  change the documentation meaning.

## 2022-09-27 DIAGNOSIS — K652 Spontaneous bacterial peritonitis: Secondary | ICD-10-CM | POA: Diagnosis not present

## 2022-09-27 LAB — MAGNESIUM: Magnesium: 1.2 mg/dL — ABNORMAL LOW (ref 1.7–2.4)

## 2022-09-27 LAB — BASIC METABOLIC PANEL
Anion gap: 12 (ref 5–15)
BUN: <5 mg/dL — ABNORMAL LOW (ref 6–20)
CO2: 19 mmol/L — ABNORMAL LOW (ref 22–32)
Calcium: 8.5 mg/dL — ABNORMAL LOW (ref 8.9–10.3)
Chloride: 99 mmol/L (ref 98–111)
Creatinine, Ser: 0.51 mg/dL — ABNORMAL LOW (ref 0.61–1.24)
GFR, Estimated: >60 mL/min (ref >60)
Glucose, Bld: 89 mg/dL (ref 70–99)
Potassium: 3.4 mmol/L — ABNORMAL LOW (ref 3.5–5.1)
Sodium: 130 mmol/L — ABNORMAL LOW (ref 135–145)

## 2022-09-27 LAB — HEMOGLOBIN AND HEMATOCRIT, BLOOD
HCT: 24.5 % — ABNORMAL LOW (ref 39.0–52.0)
Hemoglobin: 8 g/dL — ABNORMAL LOW (ref 13.0–17.0)

## 2022-09-27 MED ORDER — POTASSIUM CHLORIDE CRYS ER 20 MEQ PO TBCR
40.0000 meq | EXTENDED_RELEASE_TABLET | Freq: Once | ORAL | Status: AC
  Administered 2022-09-27: 40 meq via ORAL
  Filled 2022-09-27: qty 2

## 2022-09-27 MED ORDER — MAGNESIUM SULFATE 4 GM/100ML IV SOLN
4.0000 g | Freq: Once | INTRAVENOUS | Status: AC
  Administered 2022-09-27: 4 g via INTRAVENOUS
  Filled 2022-09-27: qty 100

## 2022-09-27 NOTE — Progress Notes (Addendum)
ID brief note  Remains afebrile CT abdomen/pelvis 8/1 improved findings, no new fluid collections. Both drains removed by IR   7/30 left JP drain fluid growing Candida- this was collected from JP drain and likely a contaminant , patient improving with resolution of infection in CT abdomen/pelvis 8/1, no need to add antifungal   Per DR Jacqulyn Bath, patient may get transferred next Monday or Tuesday for procedure Would finish off current antibiotics for 7 days. EOT 8/7 OK to switch to PO linezolid, ciprofloxacin and metronidazole for same duration if discharged or IV issues  PO Vancomycin for C diff prophylaxis to be continued 7 days after stopping abtx ID will so, please call with questions   Odette Fraction, MD Infectious Disease Physician [facility] [address] [place], [place] [address] Phone: [phone]  Fax: [phone]

## 2022-09-27 NOTE — Progress Notes (Addendum)
We got a note from Ms. Tad Moore ,who is a case Production designer, theatre/television/film from [employer] requested to call her back this evening around  8 pm. She tried to manage an arrangement for Pt's on his coming appointment at [facility] at am tomorrow. The messages from her was the Pt will not have a transportation on the way back from [facility] to [facility].  I tried to call her back and could not reach her, so I left a voicemail.   Her number is [phone]. We will discuss with the day shift team and try to reach out to her again at am. We will verify with MD if Pt is medically stable enough and ready to travel tomorrow for this appointment.   Pt refused to take any pills tonight including antibiotic capsule(Metronidazole). When a staff nurse asked him for the reason why. Pt stated " Just leave me alone and don't talk to me! I take too many pills already today, no more pills!." He was very agitated, not opened for any conversation and did not want to be bothered tonight.   He has been stable hemodynamically, afebrile, no acute distress. We will monitor.    Filiberto Pinks, RN

## 2022-09-27 NOTE — TOC Progression Note (Addendum)
Transition of Care (TOC) - Progression Note  Donn Pierini RN, BSN Transitions of Care Unit 4E- RN Case Manager See Treatment Team for direct phone #   Patient Details  Name: Samuel Blevins MRN: [id number] Date of Birth: 1965/07/29  Transition of Care [facility]) CM/SW Contact  Zenda Alpers, Lenn Sink, RN Phone Number: 09/27/2022, 4:23 PM  Clinical Narrative:    [facility] office received call this am from [facility] requesting a return call regarding pt's appointment on Monday and transport needs. CM returned call (work # [phone], cell# [phone])- and spoke with Ms.Tad Moore- per conversation Ms. Rosalie Gums identified herself as a CM with [employer] who has been assisting pt with housing needs. Pt is in the process of being approved for a voucher- Ms. Tad Moore is following - Mr Payer had contacted her concerned that he was going to be released from [facility] after procedure and was unsure how he would get transported back to [place]- CM explained that pt was not going to be discharged- that [facility] would transport him to [facility] and back to hospital on Monday for procedure- MD here would then determine when pt is medically stable for discharge post procedure.   Per Tad Moore if pt is still in hospital on 8/7- then [employer] will plan to come do port of entry intake for housing, which is next step.   CM also received following  msg from MD regarding needs for transport to [facility] for GI procedure:  This pt need to go to [facility] for procedure. It will be a day trip. Will need arrangements to be done. He went there last time too in similiar way. Plan:         Daytrip means the patient will transport to [facility] for a procedure than transport back to your facility once procedure is completed.         Please have patient arrive at 145 PM on Monday 8/5. Please have transport stay at our unit for the duration of procedure so patient can be transported back in  a timely fashion, and to help faciliate flow in our PACU.          The address of our unit is [address], [place] [address], GI Procedures Unit (Basement [facility])          Please have the patient's nurse call report to Charge RN on day of procedure at [phone].        NPO at [place] day of procedure          Please update [place] if there are any acute changes in clinical condition or status that would make this patient unsuitable for transport.    CM has contacted [facility] to arrange transport to [facility]- spoke w/ Rut[place] in [place]-  Transport has been set up with a pickup time for 1130am - Monday 8/5  TOC will follow up on Monday for needed transport paperwork.   Expected Discharge Plan: Homeless Shelter Barriers to Discharge: Continued Medical Work up  Expected Discharge Plan and Services In-house Referral: Clinical Social Work Discharge Planning Services: CM Consult   Living arrangements for the past 2 months: Homeless                                       Social Determinants of Health (SDOH) Interventions SDOH Screenings   Food Insecurity:  Food Insecurity Present (09/19/2022)  Housing: High Risk (09/19/2022)  Transportation Needs: Unmet Transportation Needs (09/19/2022)  Utilities: At Risk (09/19/2022)  Depression (PHQ2-9): High Risk (02/14/2022)  Financial Resource Strain: Not on File (06/14/2021)   Received from [place], [place]  Physical Activity: Not on File (06/14/2021)   Received from [place], [place]  Social Connections: Not on File (06/14/2021)   Received from [place], [place]  Stress: Not on File (06/14/2021)   Received from [place], [place]  Tobacco Use: High Risk (09/19/2022)    Readmission Risk Interventions    09/04/2022   11:40 AM  Readmission Risk Prevention Plan  Transportation Screening Complete  Medication Review (RN Care Manager) Complete  PCP or Specialist appointment within 3-5 days of discharge Complete  HRI or Home Care Consult  Complete  SW Recovery Care/Counseling Consult Complete  Palliative Care Screening Not Applicable  Skilled Nursing Facility Not Applicable

## 2022-09-27 NOTE — Progress Notes (Signed)
PROGRESS NOTE    Samuel Blevins  WUJ:[id number] DOB: 1966-01-18 DOA: 09/18/2022 PCP: No primary care provider on file.   Brief Narrative:  57 year old with history of recurrent pancreatitis, chronic pancreatic insufficiency, HTN , recurrent alcohol use yet again admitted with acute recurrent alcoholic pancreatitis.  Initially patient had left AMA about 4-5 days prior to this admission.  Patient readmitted for acute recurrent alcoholic pancreatitis with worsening ascites, initial labs of ascites fluid indicate elevated amylase concerning for pancreatic duct disruption.     Hospital course complicated with presence of recurrent ascites, infected ascites fluid, ongoing abdominal pain and acute renal failure.  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 Being followed by GI, nephrology, and ID.    GI previously recommending transfer to outside facility for ERCP to attempt to stent pancreatic duct disruption. [facility] accepted patient for procedure on 09/17/2022.  Procedure was apparently difficult procedure with manipulation of the pancreas that ultimately had to be accessed via the gastric wall, stent was successfully placed as planned.  Patient's NG tube was instantly removed by staff during procedure then purposefully removed by patient post-anesthesia.   Patient left our facility AMA on the 24th due to "I have to go get my check" - Fortunately patient did return to our facility later in the evening and was readmitted back to our service.  CT abdomen at intake did show pneumoperitoneum as well as diffuse loculated regions in the abdomen concerning for abscess, IR asked for drain placement.  Dr. Corliss Parish GI at [facility] confirms that pneumoperitoneum is expected given their approach through the gastric wall.  Will advance diet as tolerated with ultimate plans for discharge home (patient is homeless which makes dispo  difficult) with ultimate follow-up at [facility] for repeat ERCP 2 weeks after pancreatic stenting(7/23), IR managing bilateral abdominal drains.  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 Pancreaticogastrostomy stent placed 09/17/2022 at [facility] with Dr. Corliss Parish.  Plan to repeat ERCP at their facility in 2 weeks(first full week of August) to evaluate stent and likely replace with larger diameter stent. - GI here initially recommending repeat paracentesis this week to check amylase levels and evidence of ongoing infection -will collect from his left upper quadrant JP drain given this appears to be draining purely ascites at this time. - IR (Dr Fredia Sorrow) -requires repeat CT abdomen pelvis with contrast for drains can be removed.  Which was done 09/25/2022, and shows no fluid collection at the place of catheter so her catheter is pulled out by IR on 09/26/2022. -Paracentesis 7/1 - 3L; 7/5 - 3L; 7/8 2L, 7/12 1.7 L. 7/21 600cc, -CT 24/25th consistent with loculated abdominal fluid vs abscess -drain placed by IR -Multiple Cortrak's removed by patient -dislodged again intentionally 7/23 at [facility];  -Cultures (blood and paracentesis) initially negative however cultures on repeat paracentesis from  7/12 shows vancomycin resistant enterococcus; drain  growing proteus- on daptomycin/cefepime per ID **(Patient also had been diagnosed with Cdiff - currently completed initial course but on prophylactic dosing now) It looks like GI had signed off on 09/20/2022.  I was able to speak to Dr. Manson Passey, GI at [facility].  He clarified that the plan is for patient to go there on Monday or Tuesday to get the stent upsized.  He said that his office/NP will call me with the details.   Anemia of chronic disease:   Patient has chart flagged for blood product refusal but he verbally agreed to blood and wants the flag removed. 1u PRBC 7/22 transfused successfully -hemoglobin improved but dropped to 7.0 and 1 unit of PRBC transfusion was given on 09/26/2022.  Posttransfusion H&H is not done, I have ordered again now.   Thrombocytosis -Questionably reactive in the setting of above infection/anemai.   Hypoalbuminemia Hypovolemic hyponatremia Hypokalemia/hypomagnesemia -Secondary to above, improving with p.o. intake as NG tube has been removed we will not replace it, currently on soft diet.  Has low potassium and low magnesium.  Will replace both.   Recurrent alcohol induced pancreatitis. Nausea vomiting, resolved - Secondary to chronic alcohol use/abuse p.o. intake as tolerated, pain control avoiding IV narcotics, antiemetics, PPI ongoing PRN   Severe protein caloric malnutrition High risk for refeeding syndrome -Complicated by chronic alcohol use   Alcohol abuse - Alcohol withdrawal protocol completed, currently outside the window for any further withdrawal symptoms.    Essential hypertension -Currently well-controlled off medications, as needed hydralazine for hypertensive events   GERD/duodenitis -Continue PPI   Acute kidney injury with hyperkalemia, resolved:    Acute hypoxic respiratory failure, transient and resolved:  Chest x-ray reported atelectasis, likely secondary to enlarging ascites. Improved s/p paracentesis   Noncompliance, profound,  recurrent Acute metabolic/toxic encephalopathy, resolved -Multiple episodes of abnormal behavior previously, multiple episodes of Cortrak removal, nursing staff noted patient eating cigarettes last week.   -Remains alert and oriented, patient previously left AMA on the 24th but did return later that day.  Not eating much.  Discussed with him at length.  He said he does not like the food.  He has been difficult with the nurses, refusing several medications.  When discussed with him, he said " doc, I am taking 40 medications, I am tired of them, they are not keeping me from dying".  When I made him realize that these medications are indeed keeping him from dying then he responded " I am taking the ones who are keeping me from dying" but then obviously he could not tell me which of those he thinks are keeping him from dying.  At the end, he said he will try to take his medications.  DVT prophylaxis: enoxaparin (LOVENOX) injection 40 mg Start: 09/18/22 1745   Code Status: Full Code  Family Communication:  None present at bedside.  Plan of care discussed with patient in length and he/she verbalized understanding and agreed with it.  Status is: Inpatient Remains inpatient appropriate because: Needs to go back to [facility] for replacement of the stent most likely early next week.   Estimated body mass index is 22.27 kg/m as calculated from the following:   Height as of this encounter: 5\' 9"  (1.753 m).   Weight as of this encounter: 68.4 kg.    Nutritional Assessment: Body mass index is 22.27 kg/m.Marland Kitchen Seen by dietician.  I agree with the assessment and plan as outlined below: Nutrition Status:        .  Skin Assessment: I have examined the patient's skin and I agree with the wound assessment as performed by the wound care RN as outlined below:    Consultants:  GI, IR  Procedures:  As above  Antimicrobials:  Anti-infectives (From admission, onward)    Start     Dose/Rate Route Frequency  Ordered Stop   09/25/22 1000  metroNIDAZOLE (FLAGYL) tablet 500 mg        500 mg Oral Every 12 hours 09/25/22 0906     09/22/22 2000  ceFEPIme (MAXIPIME) 2 g in sodium chloride 0.9 % 100 mL IVPB        2 g 200 mL/hr over 30 Minutes Intravenous Every 8 hours 09/22/22 1908     09/20/22 2200  vancomycin (VANCOCIN) 50 mg/mL oral solution SOLN 125 mg        125 mg Oral Every 12 hours 09/20/22 0909     09/20/22 1000  cefTRIAXone (ROCEPHIN) 2 g in sodium chloride 0.9 % 100 mL IVPB  Status:  Discontinued        2 g 200 mL/hr over 30 Minutes Intravenous Every 24 hours 09/20/22 0909 09/22/22 1858   09/18/22 2300  metroNIDAZOLE (FLAGYL) IVPB 500 mg  Status:  Discontinued        500 mg 100 mL/hr over 60 Minutes Intravenous Every 12 hours 09/18/22 2200 09/19/22 0910   09/18/22 2300  ceFEPIme (MAXIPIME) 2 g in sodium chloride 0.9 % 100 mL IVPB  Status:  Discontinued        2 g 200 mL/hr over 30 Minutes Intravenous Every 8 hours 09/18/22 2210 09/19/22 0910   09/18/22 1815  DAPTOmycin (CUBICIN) 700 mg in sodium chloride 0.9 % IVPB        10 mg/kg  70.7 kg (Adjusted) 128 mL/hr over 30 Minutes Intravenous Daily 09/18/22 1803     09/18/22 1800  vancomycin (VANCOCIN) 50 mg/mL oral solution SOLN 125 mg  Status:  Discontinued        125 mg Oral Every 6 hours 09/18/22 1738 09/20/22 0909         Subjective: Patient seen and examined.  He has no complaints.   Objective: Vitals:   09/26/22 2303 09/26/22 2304 09/27/22 0424 09/27/22 0827  BP: 132/73 132/73 131/71 (!) 124/93  Pulse: (!) 105 (!) 105 100 (!) 107  Resp: 18  20   Temp: (!) 97.5 F (36.4 C)  98.2 F (36.8 C) 98.3 F (36.8 C)  TempSrc: Oral  Oral Oral  SpO2: 100% 99% 100% 98%  Weight:      Height:        Intake/Output Summary (Last 24 hours) at 09/27/2022 1250 Last data filed at 09/27/2022 1026 Gross per 24 hour  Intake 921.5 ml  Output 1920 ml  Net -998.5 ml   Filed Weights   09/18/22 1520  Weight: 68.4 kg     Examination:  General exam: Appears calm and comfortable  Respiratory system: Clear to auscultation. Respiratory effort normal. Cardiovascular system: S1 & S2 heard, RRR. No JVD, murmurs, rubs, gallops or clicks. No pedal edema. Gastrointestinal system: Abdomen is nondistended, soft and nontender. No organomegaly or masses felt. Normal bowel sounds heard. Central nervous system: Alert and oriented. No focal neurological deficits. Extremities: Symmetric 5 x 5 power. Skin: No rashes, lesions or ulcers.  Psychiatry: Judgement and insight appear poor Data Reviewed: I have personally reviewed following labs and imaging studies  CBC: Recent Labs  Lab 09/22/22 0607 09/24/22 0603 09/26/22 0546  WBC 16.6*  15.8* 10.5  HGB 7.1* 7.2* 7.0*  HCT 21.6* 22.4* 21.2*  MCV 82.4 83.3 84.1  PLT 858* 845* 779*   Basic Metabolic Panel: Recent Labs  Lab 09/22/22 0607 09/24/22 0603 09/26/22 0546 09/27/22 1059  NA 130* 129* 131* 130*  K 3.0* 3.5 2.9* 3.4*  CL 96* 95* 96* 99  CO2 21* 18* 18* 19*  GLUCOSE 77 81 102* 89  BUN <5* <5* <5* <5*  CREATININE 0.55* 0.57* 0.54* 0.51*  CALCIUM 8.2* 8.4* 8.6* 8.5*  MG  --   --   --  1.2*   GFR: Estimated Creatinine Clearance: 97.4 mL/min (A) (by C-G formula based on SCr of 0.51 mg/dL (L)). Liver Function Tests: No results for input(s): "AST", "ALT", "ALKPHOS", "BILITOT", "PROT", "ALBUMIN" in the last 168 hours.  No results for input(s): "LIPASE", "AMYLASE" in the last 168 hours. No results for input(s): "AMMONIA" in the last 168 hours. Coagulation Profile: No results for input(s): "INR", "PROTIME" in the last 168 hours.  Cardiac Enzymes: Recent Labs  Lab 09/23/22 0533  CKTOTAL 52   BNP (last 3 results) No results for input(s): "PROBNP" in the last 8760 hours. HbA1C: No results for input(s): "HGBA1C" in the last 72 hours. CBG: No results for input(s): "GLUCAP" in the last 168 hours. Lipid Profile: No results for input(s): "CHOL", "HDL",  "LDLCALC", "TRIG", "CHOLHDL", "LDLDIRECT" in the last 72 hours. Thyroid Function Tests: No results for input(s): "TSH", "T4TOTAL", "FREET4", "T3FREE", "THYROIDAB" in the last 72 hours. Anemia Panel: No results for input(s): "VITAMINB12", "FOLATE", "FERRITIN", "TIBC", "IRON", "RETICCTPCT" in the last 72 hours. Sepsis Labs: No results for input(s): "PROCALCITON", "LATICACIDVEN" in the last 168 hours.   Recent Results (from the past 240 hour(s))  Resp panel by RT-PCR (RSV, Flu A&B, Covid) Anterior Nasal Swab     Status: None   Collection Time: 09/18/22  3:54 PM   Specimen: Anterior Nasal Swab  Result Value Ref Range Status   SARS Coronavirus 2 by RT PCR NEGATIVE NEGATIVE Final   Influenza A by PCR NEGATIVE NEGATIVE Final   Influenza B by PCR NEGATIVE NEGATIVE Final    Comment: (NOTE) The Xpert Xpress SARS-CoV-2/FLU/RSV plus assay is intended as an aid in the diagnosis of influenza from Nasopharyngeal swab specimens and should not be used as a sole basis for treatment. Nasal washings and aspirates are unacceptable for Xpert Xpress SARS-CoV-2/FLU/RSV testing.  Fact Sheet for Patients: BloggerCourse.com  Fact Sheet for Healthcare Providers: SeriousBroker.it  This test is not yet approved or cleared by the [place] FDA and has been authorized for detection and/or diagnosis of SARS-CoV-2 by FDA under an Emergency Use Authorization (EUA). This EUA will remain in effect (meaning this test can be used) for the duration of the COVID-19 declaration under Section 564(b)(1) of the Act, 21 U.S.C. section 360bbb-3(b)(1), unless the authorization is terminated or revoked.     Resp Syncytial Virus by PCR NEGATIVE NEGATIVE Final    Comment: (NOTE) Fact Sheet for Patients: BloggerCourse.com  Fact Sheet for Healthcare Providers: SeriousBroker.it  This test is not yet approved or cleared by  the [place] FDA and has been authorized for detection and/or diagnosis of SARS-CoV-2 by FDA under an Emergency Use Authorization (EUA). This EUA will remain in effect (meaning this test can be used) for the duration of the COVID-19 declaration under Section 564(b)(1) of the Act, 21 U.S.C. section 360bbb-3(b)(1), unless the authorization is terminated or revoked.  Performed at [facility], [address], [place],  [place] [address]   Blood Culture (routine x 2)     Status: None   Collection Time: 09/18/22  4:37 PM   Specimen: BLOOD LEFT HAND  Result Value Ref Range Status   Specimen Description BLOOD LEFT HAND  Final   Special Requests   Final    BOTTLES DRAWN AEROBIC AND ANAEROBIC Blood Culture adequate volume   Culture   Final    NO GROWTH 5 DAYS Performed at [facility], [address], [place], [place] [address]    Report Status 09/23/2022 FINAL  Final  Blood Culture (routine x 2)     Status: None   Collection Time: 09/18/22  4:37 PM   Specimen: BLOOD RIGHT HAND  Result Value Ref Range Status   Specimen Description BLOOD RIGHT HAND  Final   Special Requests   Final    BOTTLES DRAWN AEROBIC AND ANAEROBIC Blood Culture adequate volume   Culture   Final    NO GROWTH 5 DAYS Performed at [facility], [address], [place], [place] [address]    Report Status 09/23/2022 FINAL  Final  Aerobic/Anaerobic Culture w Gram Stain (surgical/deep wound)     Status: None   Collection Time: 09/19/22  1:23 PM   Specimen: Abscess  Result Value Ref Range Status   Specimen Description ABSCESS ABDOMEN RIGHT  Final   Special Requests DRAIN  Final   Gram Stain   Final    ABUNDANT WBC PRESENT, PREDOMINANTLY PMN MODERATE GRAM NEGATIVE RODS    Culture   Final    ABUNDANT PROTEUS MIRABILIS RARE ENTEROBACTER AEROGENES FEW ENTEROCOCCUS FAECIUM VANCOMYCIN RESISTANT ENTEROCOCCUS ISOLATED ABUNDANT FUSOBACTERIUM NUCLEATUM BETA LACTAMASE NEGATIVE Performed at [facility], [address] [place], [place] [address]    Report Status 09/25/2022 FINAL  Final   Organism ID, Bacteria PROTEUS MIRABILIS  Final   Organism ID, Bacteria ENTEROCOCCUS FAECIUM  Final   Organism ID, Bacteria ENTEROBACTER AEROGENES  Final      Susceptibility   Enterobacter aerogenes - MIC*    CEFEPIME 2 SENSITIVE Sensitive     CEFTAZIDIME 4 SENSITIVE Sensitive     CEFTRIAXONE 1 SENSITIVE Sensitive     CIPROFLOXACIN <=0.25 SENSITIVE Sensitive     GENTAMICIN <=1 SENSITIVE Sensitive     IMIPENEM 1 SENSITIVE Sensitive     TRIMETH/SULFA <=20 SENSITIVE Sensitive     PIP/TAZO 64 INTERMEDIATE Intermediate     * RARE ENTEROBACTER AEROGENES   Enterococcus faecium - MIC*    AMPICILLIN >=32 RESISTANT Resistant     VANCOMYCIN >=32 RESISTANT Resistant     GENTAMICIN SYNERGY SENSITIVE Sensitive     * FEW ENTEROCOCCUS FAECIUM   Proteus mirabilis - MIC*    AMPICILLIN <=2 SENSITIVE Sensitive     CEFEPIME <=0.12 SENSITIVE Sensitive     CEFTAZIDIME <=1 SENSITIVE Sensitive     CEFTRIAXONE <=0.25 SENSITIVE Sensitive     CIPROFLOXACIN <=0.25 SENSITIVE Sensitive     GENTAMICIN <=1 SENSITIVE Sensitive     IMIPENEM 4 SENSITIVE Sensitive     TRIMETH/SULFA <=20 SENSITIVE Sensitive     AMPICILLIN/SULBACTAM <=2 SENSITIVE Sensitive     PIP/TAZO <=4 SENSITIVE Sensitive     * ABUNDANT PROTEUS MIRABILIS  Carbapenem Resistance Panel     Status: None   Collection Time: 09/19/22  1:23 PM  Result Value Ref Range Status   Carba Resistance IMP Gene NOT DETECTED NOT DETECTED Final   Carba Resistance VIM Gene NOT DETECTED NOT DETECTED Final  Carba Resistance NDM Gene NOT DETECTED NOT DETECTED Final   Carba Resistance KPC Gene NOT DETECTED NOT DETECTED Final   Carba Resistance OXA48 Gene NOT DETECTED NOT DETECTED Final    Comment: (NOTE) Cepheid Carba-R is an FDA-cleared nucleic acid amplification test  (NAAT)for the detection and differentiation of genes encoding the  most prevalent carbapenemases in  bacterial isolate samples. Carbapenemase gene identification and implementation of comprehensive  infection control measures are recommended by the CDC to prevent the  spread of the resistant organisms. Performed at [facility], [address] [place], [place] [address]   Body fluid culture w Gram Stain     Status: None   Collection Time: 09/24/22  5:55 PM   Specimen: JP Drain; Body Fluid  Result Value Ref Range Status   Specimen Description JP DRAINAGE LEFT  Final   Special Requests NONE  Final   Gram Stain NO WBC SEEN NO ORGANISMS SEEN   Final   Culture   Final    RARE [facility]IDA [facility]BIC[facility] CRITICAL RESULT CALLED TO, READ BACK BY AND VERIFIED WITH: RN MICHELLE BASEL [id number] AT 1137 AM BY CM Performed at [facility], [address], [place], [place] [address]    Report Status 09/27/2022 FINAL  Final     Radiology Studies: No results found.  Scheduled Meds:  enoxaparin (LOVENOX) injection  40 mg Subcutaneous Q24H   ferrous sulfate  325 mg Oral Daily   folic acid  1 mg Oral Daily   lipase/protease/amylase  12,000 Units Oral TID AC   magnesium oxide  400 mg Oral Daily   metroNIDAZOLE  500 mg Oral Q12H   multivitamin with minerals  1 tablet Oral Daily   nicotine  14 mg Transdermal Daily   pantoprazole  40 mg Oral BID AC   potassium chloride  40 mEq Oral Once   sucralfate  1 g Oral QID   thiamine  100 mg Oral Daily   vancomycin  125 mg Oral Q12H   Continuous Infusions:  ceFEPime (MAXIPIME) IV 2 g (09/27/22 0455)   DAPTOmycin (CUBICIN) 700 mg in sodium chloride 0.9 % IVPB 700 mg (09/26/22 1520)   magnesium sulfate bolus IVPB       LOS: 9 days   Hughie Closs, MD [facility]  09/27/2022, 12:50 PM   *Please note that this is a verbal dictation therefore any spelling or grammatical errors are due to the "Dragon Medical One" system interpretation.  Please page via Amion and do not message via secure chat for urgent patient care matters. Secure chat can be  used for non urgent patient care matters.  How to contact the [facility] Attending or Consulting provider 7A - 7P or covering provider during after hours 7P -7A, for this patient?  Check the care team in [facility] and look for a) attending/consulting [facility] provider listed and b) the [facility] team listed. Page or secure chat 7A-7P. Log into [email or web address] and use [place]'s universal password to access. If you do not have the password, please contact the hospital operator. Locate the [facility] provider you are looking for under [facility] and page to a number that you can be directly reached. If you still have difficulty reaching the provider, please page the [facility] (Director on Call) for the Hospitalists listed on amion for assistance.

## 2022-09-28 DIAGNOSIS — K652 Spontaneous bacterial peritonitis: Secondary | ICD-10-CM | POA: Diagnosis not present

## 2022-09-28 MED ORDER — POTASSIUM CHLORIDE CRYS ER 20 MEQ PO TBCR
40.0000 meq | EXTENDED_RELEASE_TABLET | ORAL | Status: AC
  Administered 2022-09-28 (×2): 40 meq via ORAL
  Filled 2022-09-28 (×2): qty 2

## 2022-09-28 NOTE — Progress Notes (Signed)
Received report on patient. Proceeded to go into room, provide education and administer medications, patient screamed, "Get the f*ck out and do not touch me" This RN unable to complete physical assessment/administer scheduled medications. Stated he will speak with  the day shift MD tomorrow and will not be speaking to anyone else tonight.  Night MD notified.

## 2022-09-28 NOTE — Progress Notes (Addendum)
PROGRESS NOTE    Samuel Blevins  VHQ:[id number] DOB: Mar 06, 1965 DOA: 09/18/2022 PCP: No primary care provider on file.   Brief Narrative:  57 year old with history of recurrent pancreatitis, chronic pancreatic insufficiency, HTN , recurrent alcohol use yet again admitted with acute recurrent alcoholic pancreatitis.  Initially patient had left AMA about 4-5 days prior to this admission.  Patient readmitted for acute recurrent alcoholic pancreatitis with worsening ascites, initial labs of ascites fluid indicate elevated amylase concerning for pancreatic duct disruption.     Hospital course complicated with presence of recurrent ascites, infected ascites fluid, ongoing abdominal pain and acute renal failure.  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 Being followed by GI, nephrology, and ID.    GI previously recommending transfer to outside facility for ERCP to attempt to stent pancreatic duct disruption. [facility] accepted patient for procedure on 09/17/2022.  Procedure was apparently difficult procedure with manipulation of the pancreas that ultimately had to be accessed via the gastric wall, stent was successfully placed as planned.  Patient's NG tube was instantly removed by staff during procedure then purposefully removed by patient post-anesthesia.   Patient left our facility AMA on the 24th due to "I have to go get my check" - Fortunately patient did return to our facility later in the evening and was readmitted back to our service.  CT abdomen at intake did show pneumoperitoneum as well as diffuse loculated regions in the abdomen concerning for abscess, IR asked for drain placement.  Dr. Corliss Parish GI at [facility] confirms that pneumoperitoneum is expected given their approach through the gastric wall.  Will advance diet as tolerated with ultimate plans for discharge home (patient is homeless which makes dispo  difficult) with ultimate follow-up at [facility] for repeat ERCP 2 weeks after pancreatic stenting(7/23), IR managing bilateral abdominal drains.  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 Pancreaticogastrostomy stent placed 09/17/2022 at [facility] with Dr. Corliss Parish.  Plan to repeat ERCP at their facility in 2 weeks(first full week of August) to evaluate stent and likely replace with larger diameter stent. - GI here initially recommending repeat paracentesis this week to check amylase levels and evidence of ongoing infection -will collect from his left upper quadrant JP drain given this appears to be draining purely ascites at this time. - IR (Dr Fredia Sorrow) -requires repeat CT abdomen pelvis with contrast for drains can be removed.  Which was done 09/25/2022, and shows no fluid collection at the place of catheter so her catheter is pulled out by IR on 09/26/2022. -Paracentesis 7/1 - 3L; 7/5 - 3L; 7/8 2L, 7/12 1.7 L. 7/21 600cc, -CT 24/25th consistent with loculated abdominal fluid vs abscess -drain placed by IR -Multiple Cortrak's removed by patient -dislodged again intentionally 7/23 at [facility];  -Cultures (blood and paracentesis) initially negative however cultures on repeat paracentesis from  7/12 shows vancomycin resistant enterococcus; drain  growing proteus- on daptomycin/cefepime per ID **(Patient also had been diagnosed with Cdiff - currently completed initial course but on prophylactic vanc dosing now) It looks like GI had signed off on 09/20/2022.  I was able to speak to Dr. Jamse Mead, GI at [facility].  Received a call from his NP afterwards and she confirmed that patient is scheduled to have upsizing of the pancreatic duct stent at 1:30 PM on Monday.  TOC has been informed and to my knowledge, transportation has been all set.  Patient is also informed.   Anemia of chronic disease:   Patient has chart flagged for blood product refusal but he verbally agreed to blood and wants the flag removed. 1u PRBC 7/22 transfused successfully -hemoglobin improved but dropped to 7.0 and 1 unit of PRBC transfusion was given on 09/26/2022.  Hemoglobin 8.0 posttransfusion.   Thrombocytosis -Questionably reactive in the setting of above infection/anemai.   Hypoalbuminemia Hypovolemic hyponatremia Hypokalemia/hypomagnesemia -Secondary to above, improving with p.o. intake as NG tube has been removed we will not replace it, currently on soft diet.  Has low potassium and low magnesium.  Will replace both.   Recurrent alcohol induced pancreatitis. Nausea vomiting, resolved - Secondary to chronic alcohol use/abuse p.o. intake as tolerated, pain control avoiding IV narcotics, antiemetics, PPI ongoing PRN   Severe protein caloric malnutrition High risk for refeeding syndrome -Complicated by chronic alcohol use   Alcohol abuse - Alcohol withdrawal protocol completed, currently outside the window for any further withdrawal symptoms.    Essential hypertension -Currently well-controlled off medications, as needed hydralazine for hypertensive events   GERD/duodenitis -Continue PPI   Acute kidney injury with hyperkalemia, resolved:    Acute hypoxic respiratory failure, transient and resolved:  Chest x-ray reported atelectasis, likely secondary to enlarging  ascites. Improved s/p paracentesis   Noncompliance, profound, recurrent Acute metabolic/toxic encephalopathy, resolved -Multiple episodes of abnormal behavior previously, multiple episodes of Cortrak removal, nursing staff noted patient eating cigarettes last week.   -Remains alert and oriented, patient previously left AMA on the 24th but did return later that day.  Not eating much.  Now he tells me that due to nausea and vomiting, he is not having good p.o. intake.  He keeps drinking Pepsi.  Lengthy discussion with him about not using any sort of soft drinks.  He already has Zofran as needed on board.  Per chart review, it does not look like he has been given any Zofran in last several days.  Unsure whether he is having reporting his nausea symptoms to anyone.  Hypokalemia: Will replace.  DVT prophylaxis: enoxaparin (LOVENOX) injection 40 mg Start: 09/18/22 1745   Code Status: Full Code  Family Communication:  None present at bedside.  Plan of care discussed with patient in length and he/she verbalized understanding and agreed with it.  Status is: Inpatient Remains inpatient appropriate because: Needs to go back to [facility] for replacement of the stent most likely early next week.   Estimated body mass index is 22.27 kg/m as calculated from the following:   Height as of this encounter: 5\' 9"  (1.753 m).   Weight as of this encounter: 68.4 kg.    Nutritional Assessment: Body mass index is 22.27 kg/m.Marland Kitchen Seen by dietician.  I agree with the assessment and plan as outlined below: Nutrition Status:        . Skin Assessment: I have examined the patient's skin and I agree with the wound assessment as performed by the  wound care RN as outlined below:    Consultants:  GI, IR  Procedures:  As above  Antimicrobials:  Anti-infectives (From admission, onward)    Start     Dose/Rate Route Frequency Ordered Stop   09/25/22 1000  metroNIDAZOLE (FLAGYL) tablet 500 mg        500 mg Oral  Every 12 hours 09/25/22 0906 10/02/22 2359   09/22/22 2000  ceFEPIme (MAXIPIME) 2 g in sodium chloride 0.9 % 100 mL IVPB        2 g 200 mL/hr over 30 Minutes Intravenous Every 8 hours 09/22/22 1908 10/02/22 2359   09/20/22 2200  vancomycin (VANCOCIN) 50 mg/mL oral solution SOLN 125 mg        125 mg Oral Every 12 hours 09/20/22 0909 10/09/22 2359   09/20/22 1000  cefTRIAXone (ROCEPHIN) 2 g in sodium chloride 0.9 % 100 mL IVPB  Status:  Discontinued        2 g 200 mL/hr over 30 Minutes Intravenous Every 24 hours 09/20/22 0909 09/22/22 1858   09/18/22 2300  metroNIDAZOLE (FLAGYL) IVPB 500 mg  Status:  Discontinued        500 mg 100 mL/hr over 60 Minutes Intravenous Every 12 hours 09/18/22 2200 09/19/22 0910   09/18/22 2300  ceFEPIme (MAXIPIME) 2 g in sodium chloride 0.9 % 100 mL IVPB  Status:  Discontinued        2 g 200 mL/hr over 30 Minutes Intravenous Every 8 hours 09/18/22 2210 09/19/22 0910   09/18/22 1815  DAPTOmycin (CUBICIN) 700 mg in sodium chloride 0.9 % IVPB        10 mg/kg  70.7 kg (Adjusted) 128 mL/hr over 30 Minutes Intravenous Daily 09/18/22 1803 10/02/22 2359   09/18/22 1800  vancomycin (VANCOCIN) 50 mg/mL oral solution SOLN 125 mg  Status:  Discontinued        125 mg Oral Every 6 hours 09/18/22 1738 09/20/22 0909         Subjective: Patient seen and examined.  He complains of nausea and vomiting every time he is eating.  Otherwise no abdominal pain.  Objective: Vitals:   09/27/22 2004 09/28/22 0018 09/28/22 0355 09/28/22 0833  BP: 118/69 133/83 134/81 127/86  Pulse: (!) 104 97 97 (!) 106  Resp: 17 16 17 18   Temp: 97.7 F (36.5 C) 97.8 F (36.6 C) 98.5 F (36.9 C) 98 F (36.7 C)  TempSrc: Oral Oral Oral Oral  SpO2: 97% 98% 96% 98%  Weight:      Height:        Intake/Output Summary (Last 24 hours) at 09/28/2022 1121 Last data filed at 09/28/2022 0539 Gross per 24 hour  Intake 1107.96 ml  Output 1900 ml  Net -792.04 ml   Filed Weights   09/18/22 1520   Weight: 68.4 kg    Examination:  General exam: Appears calm and comfortable  Respiratory system: Clear to auscultation. Respiratory effort normal. Cardiovascular system: S1 & S2 heard, RRR. No JVD, murmurs, rubs, gallops or clicks. No pedal edema. Gastrointestinal system: Abdomen is nondistended, soft and nontender. No organomegaly or masses felt. Normal bowel sounds heard. Central nervous system: Alert and oriented. No focal neurological deficits. Extremities: Symmetric 5 x 5 power. Skin: No rashes, lesions or ulcers.   Data Reviewed: I have personally reviewed following labs and imaging studies  CBC: Recent Labs  Lab 09/22/22 0607 09/24/22 0603 09/26/22 0546 09/27/22 1551 09/28/22 0555  WBC 16.6* 15.8* 10.5  --  6.9  HGB 7.1*  7.2* 7.0* 8.0* 8.0*  HCT 21.6* 22.4* 21.2* 24.5* 24.8*  MCV 82.4 83.3 84.1  --  83.8  PLT 858* 845* 779*  --  699*   Basic Metabolic Panel: Recent Labs  Lab 09/22/22 0607 09/24/22 0603 09/26/22 0546 09/27/22 1059 09/28/22 0555  NA 130* 129* 131* 130* 131*  K 3.0* 3.5 2.9* 3.4* 3.0*  CL 96* 95* 96* 99 101  CO2 21* 18* 18* 19* 20*  GLUCOSE 77 81 102* 89 93  BUN <5* <5* <5* <5* <5*  CREATININE 0.55* 0.57* 0.54* 0.51* 0.48*  CALCIUM 8.2* 8.4* 8.6* 8.5* 8.4*  MG  --   --   --  1.2*  --    GFR: Estimated Creatinine Clearance: 97.4 mL/min (A) (by C-G formula based on SCr of 0.48 mg/dL (L)). Liver Function Tests: No results for input(s): "AST", "ALT", "ALKPHOS", "BILITOT", "PROT", "ALBUMIN" in the last 168 hours.  No results for input(s): "LIPASE", "AMYLASE" in the last 168 hours. No results for input(s): "AMMONIA" in the last 168 hours. Coagulation Profile: No results for input(s): "INR", "PROTIME" in the last 168 hours.  Cardiac Enzymes: Recent Labs  Lab 09/23/22 0533  CKTOTAL 52   BNP (last 3 results) No results for input(s): "PROBNP" in the last 8760 hours. HbA1C: No results for input(s): "HGBA1C" in the last 72 hours. CBG: No  results for input(s): "GLUCAP" in the last 168 hours. Lipid Profile: No results for input(s): "CHOL", "HDL", "LDLCALC", "TRIG", "CHOLHDL", "LDLDIRECT" in the last 72 hours. Thyroid Function Tests: No results for input(s): "TSH", "T4TOTAL", "FREET4", "T3FREE", "THYROIDAB" in the last 72 hours. Anemia Panel: No results for input(s): "VITAMINB12", "FOLATE", "FERRITIN", "TIBC", "IRON", "RETICCTPCT" in the last 72 hours. Sepsis Labs: No results for input(s): "PROCALCITON", "LATICACIDVEN" in the last 168 hours.   Recent Results (from the past 240 hour(s))  Resp panel by RT-PCR (RSV, Flu A&B, Covid) Anterior Nasal Swab     Status: None   Collection Time: 09/18/22  3:54 PM   Specimen: Anterior Nasal Swab  Result Value Ref Range Status   SARS Coronavirus 2 by RT PCR NEGATIVE NEGATIVE Final   Influenza A by PCR NEGATIVE NEGATIVE Final   Influenza B by PCR NEGATIVE NEGATIVE Final    Comment: (NOTE) The Xpert Xpress SARS-CoV-2/FLU/RSV plus assay is intended as an aid in the diagnosis of influenza from Nasopharyngeal swab specimens and should not be used as a sole basis for treatment. Nasal washings and aspirates are unacceptable for Xpert Xpress SARS-CoV-2/FLU/RSV testing.  Fact Sheet for Patients: BloggerCourse.com  Fact Sheet for Healthcare Providers: SeriousBroker.it  This test is not yet approved or cleared by the [place] FDA and has been authorized for detection and/or diagnosis of SARS-CoV-2 by FDA under an Emergency Use Authorization (EUA). This EUA will remain in effect (meaning this test can be used) for the duration of the COVID-19 declaration under Section 564(b)(1) of the Act, 21 U.S.C. section 360bbb-3(b)(1), unless the authorization is terminated or revoked.     Resp Syncytial Virus by PCR NEGATIVE NEGATIVE Final    Comment: (NOTE) Fact Sheet for Patients: BloggerCourse.com  Fact Sheet for  Healthcare Providers: SeriousBroker.it  This test is not yet approved or cleared by the [place] FDA and has been authorized for detection and/or diagnosis of SARS-CoV-2 by FDA under an Emergency Use Authorization (EUA). This EUA will remain in effect (meaning this test can be used) for the duration of the COVID-19 declaration under Section 564(b)(1) of the Act, 21 U.S.C. section  360bbb-3(b)(1), unless the authorization is terminated or revoked.  Performed at [facility], [address] [place], [place] [address]   Blood Culture (routine x 2)     Status: None   Collection Time: 09/18/22  4:37 PM   Specimen: BLOOD LEFT HAND  Result Value Ref Range Status   Specimen Description BLOOD LEFT HAND  Final   Special Requests   Final    BOTTLES DRAWN AEROBIC AND ANAEROBIC Blood Culture adequate volume   Culture   Final    NO GROWTH 5 DAYS Performed at [facility], [address], [place], [place] [address]    Report Status 09/23/2022 FINAL  Final  Blood Culture (routine x 2)     Status: None   Collection Time: 09/18/22  4:37 PM   Specimen: BLOOD RIGHT HAND  Result Value Ref Range Status   Specimen Description BLOOD RIGHT HAND  Final   Special Requests   Final    BOTTLES DRAWN AEROBIC AND ANAEROBIC Blood Culture adequate volume   Culture   Final    NO GROWTH 5 DAYS Performed at [facility], [address] [place], [place], [place] [address]    Report Status 09/23/2022 FINAL  Final  Aerobic/Anaerobic Culture w Gram Stain (surgical/deep wound)     Status: None   Collection Time: 09/19/22  1:23 PM   Specimen: Abscess  Result Value Ref Range Status   Specimen Description ABSCESS ABDOMEN RIGHT  Final   Special Requests DRAIN  Final   Gram Stain   Final    ABUNDANT WBC PRESENT, PREDOMINANTLY PMN MODERATE GRAM NEGATIVE RODS    Culture   Final    ABUNDANT PROTEUS MIRABILIS RARE ENTEROBACTER AEROGENES FEW ENTEROCOCCUS FAECIUM VANCOMYCIN  RESISTANT ENTEROCOCCUS ISOLATED ABUNDANT FUSOBACTERIUM NUCLEATUM BETA LACTAMASE NEGATIVE Performed at [facility], [address] [place], [place] [address]    Report Status 09/25/2022 FINAL  Final   Organism ID, Bacteria PROTEUS MIRABILIS  Final   Organism ID, Bacteria ENTEROCOCCUS FAECIUM  Final   Organism ID, Bacteria ENTEROBACTER AEROGENES  Final      Susceptibility   Enterobacter aerogenes - MIC*    CEFEPIME 2 SENSITIVE Sensitive     CEFTAZIDIME 4 SENSITIVE Sensitive     CEFTRIAXONE 1 SENSITIVE Sensitive     CIPROFLOXACIN <=0.25 SENSITIVE Sensitive     GENTAMICIN <=1 SENSITIVE Sensitive     IMIPENEM 1 SENSITIVE Sensitive     TRIMETH/SULFA <=20 SENSITIVE Sensitive     PIP/TAZO 64 INTERMEDIATE Intermediate     * RARE ENTEROBACTER AEROGENES   Enterococcus faecium - MIC*    AMPICILLIN >=32 RESISTANT Resistant     VANCOMYCIN >=32 RESISTANT Resistant     GENTAMICIN SYNERGY SENSITIVE Sensitive     * FEW ENTEROCOCCUS FAECIUM   Proteus mirabilis - MIC*    AMPICILLIN <=2 SENSITIVE Sensitive     CEFEPIME <=0.12 SENSITIVE Sensitive     CEFTAZIDIME <=1 SENSITIVE Sensitive     CEFTRIAXONE <=0.25 SENSITIVE Sensitive     CIPROFLOXACIN <=0.25 SENSITIVE Sensitive     GENTAMICIN <=1 SENSITIVE Sensitive     IMIPENEM 4 SENSITIVE Sensitive     TRIMETH/SULFA <=20 SENSITIVE Sensitive     AMPICILLIN/SULBACTAM <=2 SENSITIVE Sensitive     PIP/TAZO <=4 SENSITIVE Sensitive     * ABUNDANT PROTEUS MIRABILIS  Carbapenem Resistance Panel     Status: None   Collection Time: 09/19/22  1:23 PM  Result Value Ref Range Status   Carba Resistance  IMP Gene NOT DETECTED NOT DETECTED Final   Carba Resistance VIM Gene NOT DETECTED NOT DETECTED Final   Carba Resistance NDM Gene NOT DETECTED NOT DETECTED Final   Carba Resistance KPC Gene NOT DETECTED NOT DETECTED Final   Carba Resistance OXA48 Gene NOT DETECTED NOT DETECTED Final    Comment: (NOTE) Cepheid Carba-R is an FDA-cleared nucleic acid  amplification test  (NAAT)for the detection and differentiation of genes encoding the  most prevalent carbapenemases in bacterial isolate samples. Carbapenemase gene identification and implementation of comprehensive  infection control measures are recommended by the CDC to prevent the  spread of the resistant organisms. Performed at [facility], [address] [place], [place] [address]   Body fluid culture w Gram Stain     Status: None   Collection Time: 09/24/22  5:55 PM   Specimen: JP Drain; Body Fluid  Result Value Ref Range Status   Specimen Description JP DRAINAGE LEFT  Final   Special Requests NONE  Final   Gram Stain NO WBC SEEN NO ORGANISMS SEEN   Final   Culture   Final    RARE CANDIDA ALBICANS CRITICAL RESULT CALLED TO, READ BACK BY AND VERIFIED WITH: RN MICHELLE BASEL [id number] AT 1137 AM BY CM Performed at [facility], [address], [place], [place] [address]    Report Status 09/27/2022 FINAL  Final     Radiology Studies: No results found.  Scheduled Meds:  enoxaparin (LOVENOX) injection  40 mg Subcutaneous Q24H   ferrous sulfate  325 mg Oral Daily   folic acid  1 mg Oral Daily   lipase/protease/amylase  12,000 Units Oral TID AC   magnesium oxide  400 mg Oral Daily   metroNIDAZOLE  500 mg Oral Q12H   multivitamin with minerals  1 tablet Oral Daily   nicotine  14 mg Transdermal Daily   pantoprazole  40 mg Oral BID AC   sucralfate  1 g Oral QID   thiamine  100 mg Oral Daily   vancomycin  125 mg Oral Q12H   Continuous Infusions:  ceFEPime (MAXIPIME) IV Stopped (09/28/22 0538)   DAPTOmycin (CUBICIN) 700 mg in sodium chloride 0.9 % IVPB Stopped (09/27/22 1603)     LOS: 10 days   Hughie Closs, MD [facility]  09/28/2022, 11:21 AM   *Please note that this is a verbal dictation therefore any spelling or grammatical errors are due to the "Dragon Medical One" system interpretation.  Please page via Amion and do not message via secure  chat for urgent patient care matters. Secure chat can be used for non urgent patient care matters.  How to contact the [facility] Attending or Consulting provider 7A - 7P or covering provider during after hours 7P -7A, for this patient?  Check the care team in [facility] and look for a) attending/consulting [facility] provider listed and b) the [facility] team listed. Page or secure chat 7A-7P. Log into [email or web address] and use [facility] universal password to access. If you do not have the password, please contact the hospital operator. Locate the [facility] provider you are looking for under [facility] and page to a number that you can be directly reached. If you still have difficulty reaching the provider, please page the [facility] (Director on Call) for the Hospitalists listed on amion for assistance.

## 2022-09-28 NOTE — Progress Notes (Signed)
Ok to replace k 3 with KCL x2 per Dr. Jacqulyn Bath.  Ulyses Southward, PharmD, BCIDP, AAHIVP, CPP Infectious Disease Pharmacist 09/28/2022 11:36 AM

## 2022-09-28 NOTE — Progress Notes (Signed)
Patient refuses care and ask the help desk to call the MD.

## 2022-09-29 DIAGNOSIS — K652 Spontaneous bacterial peritonitis: Secondary | ICD-10-CM | POA: Diagnosis not present

## 2022-09-29 LAB — CBC WITH DIFFERENTIAL/PLATELET
Abs Immature Granulocytes: 0.21 10*3/uL — ABNORMAL HIGH (ref 0.00–0.07)
Basophils Absolute: 0.1 10*3/uL (ref 0.0–0.1)
Basophils Relative: 1 %
Eosinophils Absolute: 0.1 10*3/uL (ref 0.0–0.5)
Eosinophils Relative: 2 %
HCT: 25 % — ABNORMAL LOW (ref 39.0–52.0)
Hemoglobin: 8.1 g/dL — ABNORMAL LOW (ref 13.0–17.0)
Immature Granulocytes: 3 %
Lymphocytes Relative: 22 %
Lymphs Abs: 1.8 10*3/uL (ref 0.7–4.0)
MCH: 26.9 pg (ref 26.0–34.0)
MCHC: 32.4 g/dL (ref 30.0–36.0)
MCV: 83.1 fL (ref 80.0–100.0)
Monocytes Absolute: 0.8 10*3/uL (ref 0.1–1.0)
Monocytes Relative: 11 %
Neutro Abs: 4.9 10*3/uL (ref 1.7–7.7)
Neutrophils Relative %: 61 %
Platelets: 696 10*3/uL — ABNORMAL HIGH (ref 150–400)
RBC: 3.01 MIL/uL — ABNORMAL LOW (ref 4.22–5.81)
RDW: 20.4 % — ABNORMAL HIGH (ref 11.5–15.5)
WBC: 7.9 10*3/uL (ref 4.0–10.5)
nRBC: 0 % (ref 0.0–0.2)

## 2022-09-29 LAB — MAGNESIUM: Magnesium: 1.1 mg/dL — ABNORMAL LOW (ref 1.7–2.4)

## 2022-09-29 LAB — BASIC METABOLIC PANEL
Anion gap: 12 (ref 5–15)
BUN: <5 mg/dL — ABNORMAL LOW (ref 6–20)
CO2: 20 mmol/L — ABNORMAL LOW (ref 22–32)
Calcium: 8.8 mg/dL — ABNORMAL LOW (ref 8.9–10.3)
Chloride: 101 mmol/L (ref 98–111)
Creatinine, Ser: 0.52 mg/dL — ABNORMAL LOW (ref 0.61–1.24)
GFR, Estimated: >60 mL/min (ref >60)
Glucose, Bld: 77 mg/dL (ref 70–99)
Potassium: 3.4 mmol/L — ABNORMAL LOW (ref 3.5–5.1)
Sodium: 133 mmol/L — ABNORMAL LOW (ref 135–145)

## 2022-09-29 LAB — POTASSIUM: Potassium: 3.5 mmol/L (ref 3.5–5.1)

## 2022-09-29 MED ORDER — ACETAMINOPHEN 325 MG PO TABS
650.0000 mg | ORAL_TABLET | Freq: Four times a day (QID) | ORAL | Status: DC | PRN
  Administered 2022-10-01: 650 mg via ORAL
  Filled 2022-09-29: qty 2

## 2022-09-29 MED ORDER — POTASSIUM CHLORIDE CRYS ER 20 MEQ PO TBCR
40.0000 meq | EXTENDED_RELEASE_TABLET | ORAL | Status: AC
  Administered 2022-09-29 (×2): 40 meq via ORAL
  Filled 2022-09-29 (×2): qty 2

## 2022-09-29 MED ORDER — POTASSIUM CHLORIDE CRYS ER 20 MEQ PO TBCR
40.0000 meq | EXTENDED_RELEASE_TABLET | Freq: Once | ORAL | Status: AC
  Administered 2022-09-29: 40 meq via ORAL
  Filled 2022-09-29: qty 2

## 2022-09-29 MED ORDER — MAGNESIUM SULFATE 4 GM/100ML IV SOLN
4.0000 g | Freq: Once | INTRAVENOUS | Status: AC
  Administered 2022-09-29: 4 g via INTRAVENOUS
  Filled 2022-09-29: qty 100

## 2022-09-29 MED ORDER — TRAMADOL HCL 50 MG PO TABS
50.0000 mg | ORAL_TABLET | Freq: Two times a day (BID) | ORAL | Status: DC | PRN
  Administered 2022-09-30 – 2022-10-01 (×2): 50 mg via ORAL
  Filled 2022-09-29 (×2): qty 1

## 2022-09-29 NOTE — Progress Notes (Signed)
PROGRESS NOTE    Samuel Blevins  NWG:[id number] DOB: Mar 30, 1965 DOA: 09/18/2022 PCP: No primary care provider on file.   Brief Narrative:  57 year old with history of recurrent pancreatitis, chronic pancreatic insufficiency, HTN , recurrent alcohol use yet again admitted with acute recurrent alcoholic pancreatitis.  Initially patient had left AMA about 4-5 days prior to this admission.  Patient readmitted for acute recurrent alcoholic pancreatitis with worsening ascites, initial labs of ascites fluid indicate elevated amylase concerning for pancreatic duct disruption.     Hospital course complicated with presence of recurrent ascites, infected ascites fluid, ongoing abdominal pain and acute renal failure.  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 Being followed by GI, nephrology, and ID.    GI previously recommending transfer to outside facility for ERCP to attempt to stent pancreatic duct disruption. [facility] accepted patient for procedure on 09/17/2022.  Procedure was apparently difficult procedure with manipulation of the pancreas that ultimately had to be accessed via the gastric wall, stent was successfully placed as planned.  Patient's NG tube was instantly removed by staff during procedure then purposefully removed by patient post-anesthesia.   Patient left our facility AMA on the 24th due to "I have to go get my check" - Fortunately patient did return to our facility later in the evening and was readmitted back to our service.  CT abdomen at intake did show pneumoperitoneum as well as diffuse loculated regions in the abdomen concerning for abscess, IR asked for drain placement.  Dr. Corliss Parish GI at [facility] confirms that pneumoperitoneum is expected given their approach through the gastric wall.  Will advance diet as tolerated with ultimate plans for discharge home (patient is homeless which makes dispo  difficult) with ultimate follow-up at [facility] for repeat ERCP 2 weeks after pancreatic stenting(7/23), IR managing bilateral abdominal drains.  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 Pancreaticogastrostomy stent placed 09/17/2022 at [facility] with Dr. Corliss Parish.  Plan to repeat ERCP at their facility in 2 weeks(first full week of August) to evaluate stent and likely replace with larger diameter stent. - GI here initially recommending repeat paracentesis this week to check amylase levels and evidence of ongoing infection -will collect from his left upper quadrant JP drain given this appears to be draining purely ascites at this time. - IR (Dr Fredia Sorrow) -requires repeat CT abdomen pelvis with contrast for drains can be removed.  Which was done 09/25/2022, and shows no fluid collection at the place of catheter so her catheter is pulled out by IR on 09/26/2022. -Paracentesis 7/1 - 3L; 7/5 - 3L; 7/8 2L, 7/12 1.7 L. 7/21 600cc, -CT 24/25th consistent with loculated abdominal fluid vs abscess -drain placed by IR -Multiple Cortrak's removed by patient -dislodged again intentionally 7/23 at [facility];  -Cultures (blood and paracentesis) initially negative however cultures on repeat paracentesis from  7/12 shows vancomycin resistant enterococcus; drain  growing proteus- on daptomycin/cefepime per ID **(Patient also had been diagnosed with Cdiff - currently completed initial course but on prophylactic vanc dosing now) It looks like GI had signed off on 09/20/2022.  I was able to speak to Dr. Jamse Mead, GI at [facility].  Received a call from his NP afterwards and she confirmed that patient is scheduled to have upsizing of the pancreatic duct stent at 1:30 PM on Monday.  TOC has been informed and to my knowledge, transportation has been all set.  Patient is also informed.   Anemia of chronic disease:   Patient has chart flagged for blood product refusal but he verbally agreed to blood and wants the flag removed. 1u PRBC 7/22 transfused successfully -hemoglobin improved but dropped to 7.0 and 1 unit of PRBC transfusion was given on 09/26/2022.  Hemoglobin 8.0 posttransfusion.   Thrombocytosis -Questionably reactive in the setting of above infection/anemai.   Hypovolemic hyponatremia: Stable in low 130s range.  Hypokalemia/hypomagnesemia -Secondary to above, replaced potassium yesterday for hypokalemia.  Still hypokalemic.  Checking potassium again as well as magnesium.   Recurrent alcohol induced pancreatitis. Nausea vomiting, resolved - Secondary to chronic alcohol use/abuse p.o. intake as tolerated, pain control avoiding IV narcotics, antiemetics, PPI ongoing PRN   Severe protein caloric malnutrition High risk for refeeding syndrome -Complicated by chronic alcohol use   Alcohol abuse - Alcohol withdrawal protocol completed, currently outside the window for any further withdrawal symptoms.    Essential hypertension -Currently well-controlled off medications, as needed hydralazine for hypertensive events   GERD/duodenitis -Continue PPI   Acute kidney injury with hyperkalemia, resolved:    Acute hypoxic respiratory failure, transient and resolved:  Chest x-ray reported atelectasis, likely secondary to enlarging ascites. Improved s/p  paracentesis   Noncompliance, profound, recurrent Acute metabolic/toxic encephalopathy, resolved -Multiple episodes of abnormal behavior previously, multiple episodes of Cortrak removal, nursing staff noted patient eating cigarettes last week.   -Remains alert and oriented, patient previously left AMA on the 24th but did return later that day.  Not eating much.  he tells me that due to nausea and vomiting, he is not having good p.o. intake.  He keeps drinking Pepsi.  Lengthy discussion with him yesterday about not using any sort of soft drinks.  He already has Zofran as needed on board.  Per chart review, it does not look like he has been given any Zofran in last several days.  Per my recommendation yesterday, he has tried liquid diet and did well but intake remains low.  Yet again, patient continues to refuse medications or labs at times.  DVT prophylaxis: enoxaparin (LOVENOX) injection 40 mg Start: 09/18/22 1745   Code Status: Full Code  Family Communication:  None present at bedside.  Plan of care discussed with patient in length and he/she verbalized understanding and agreed with it.  Status is: Inpatient Remains inpatient appropriate because: Needs to go back to [facility] for replacement of the stent most likely early next week.   Estimated body mass index is 22.27 kg/m as calculated from the following:   Height as of this encounter: 5\' 9"  (1.753 m).   Weight as of this encounter: 68.4 kg.    Nutritional Assessment: Body mass index is 22.27 kg/m.Marland Kitchen Seen by dietician.  I agree with the assessment and plan as outlined below: Nutrition Status:        . Skin Assessment: I have examined the patient's skin and I agree with the wound assessment  as performed by the wound care RN as outlined below:    Consultants:  GI, IR  Procedures:  As above  Antimicrobials:  Anti-infectives (From admission, onward)    Start     Dose/Rate Route Frequency Ordered Stop   09/25/22 1000   metroNIDAZOLE (FLAGYL) tablet 500 mg        500 mg Oral Every 12 hours 09/25/22 0906 10/02/22 2359   09/22/22 2000  ceFEPIme (MAXIPIME) 2 g in sodium chloride 0.9 % 100 mL IVPB        2 g 200 mL/hr over 30 Minutes Intravenous Every 8 hours 09/22/22 1908 10/02/22 2359   09/20/22 2200  vancomycin (VANCOCIN) 50 mg/mL oral solution SOLN 125 mg        125 mg Oral Every 12 hours 09/20/22 0909 10/09/22 2359   09/20/22 1000  cefTRIAXone (ROCEPHIN) 2 g in sodium chloride 0.9 % 100 mL IVPB  Status:  Discontinued        2 g 200 mL/hr over 30 Minutes Intravenous Every 24 hours 09/20/22 0909 09/22/22 1858   09/18/22 2300  metroNIDAZOLE (FLAGYL) IVPB 500 mg  Status:  Discontinued        500 mg 100 mL/hr over 60 Minutes Intravenous Every 12 hours 09/18/22 2200 09/19/22 0910   09/18/22 2300  ceFEPIme (MAXIPIME) 2 g in sodium chloride 0.9 % 100 mL IVPB  Status:  Discontinued        2 g 200 mL/hr over 30 Minutes Intravenous Every 8 hours 09/18/22 2210 09/19/22 0910   09/18/22 1815  DAPTOmycin (CUBICIN) 700 mg in sodium chloride 0.9 % IVPB        10 mg/kg  70.7 kg (Adjusted) 128 mL/hr over 30 Minutes Intravenous Daily 09/18/22 1803 10/02/22 2359   09/18/22 1800  vancomycin (VANCOCIN) 50 mg/mL oral solution SOLN 125 mg  Status:  Discontinued        125 mg Oral Every 6 hours 09/18/22 1738 09/20/22 0909         Subjective: Patient seen and examined.  No complaints.  Objective: Vitals:   09/28/22 1244 09/28/22 1642 09/28/22 2004 09/29/22 0859  BP: 138/83 133/79  132/73  Pulse: 99 94  88  Resp: 20 16  16   Temp: (!) 97.5 F (36.4 C) 98.4 F (36.9 C) 98.1 F (36.7 C) 98 F (36.7 C)  TempSrc: Oral Oral Oral Oral  SpO2: 98%   100%  Weight:      Height:        Intake/Output Summary (Last 24 hours) at 09/29/2022 1135 Last data filed at 09/29/2022 0859 Gross per 24 hour  Intake 164 ml  Output 2120 ml  Net -1956 ml   Filed Weights   09/18/22 1520  Weight: 68.4 kg    Examination:  General  exam: Appears calm and comfortable  Respiratory system: Clear to auscultation. Respiratory effort normal. Cardiovascular system: S1 & S2 heard, RRR. No JVD, murmurs, rubs, gallops or clicks. No pedal edema. Gastrointestinal system: Abdomen is nondistended, soft and nontender. No organomegaly or masses felt. Normal bowel sounds heard. Central nervous system: Alert and oriented. No focal neurological deficits. Extremities: Symmetric 5 x 5 power. Skin: No rashes, lesions or ulcers.  Psychiatry: Judgement and insight appear poor  Data Reviewed: I have personally reviewed following labs and imaging studies  CBC: Recent Labs  Lab 09/24/22 0603 09/26/22 0546 09/27/22 1551 09/28/22 0555 09/29/22 1020  WBC 15.8* 10.5  --  6.9 7.9  NEUTROABS  --   --   --   --  4.9  HGB 7.2* 7.0* 8.0* 8.0* 8.1*  HCT 22.4* 21.2* 24.5* 24.8* 25.0*  MCV 83.3 84.1  --  83.8 83.1  PLT 845* 779*  --  699* 696*   Basic Metabolic Panel: Recent Labs  Lab 09/24/22 0603 09/26/22 0546 09/27/22 1059 09/28/22 0555 09/29/22 1020  NA 129* 131* 130* 131* 133*  K 3.5 2.9* 3.4* 3.0* 3.4*  CL 95* 96* 99 101 101  CO2 18* 18* 19* 20* 20*  GLUCOSE 81 102* 89 93 77  BUN <5* <5* <5* <5* <5*  CREATININE 0.57* 0.54* 0.51* 0.48* 0.52*  CALCIUM 8.4* 8.6* 8.5* 8.4* 8.8*  MG  --   --  1.2*  --   --    GFR: Estimated Creatinine Clearance: 97.4 mL/min (A) (by C-G formula based on SCr of 0.52 mg/dL (L)). Liver Function Tests: No results for input(s): "AST", "ALT", "ALKPHOS", "BILITOT", "PROT", "ALBUMIN" in the last 168 hours.  No results for input(s): "LIPASE", "AMYLASE" in the last 168 hours. No results for input(s): "AMMONIA" in the last 168 hours. Coagulation Profile: No results for input(s): "INR", "PROTIME" in the last 168 hours.  Cardiac Enzymes: Recent Labs  Lab 09/23/22 0533  CKTOTAL 52   BNP (last 3 results) No results for input(s): "PROBNP" in the last 8760 hours. HbA1C: No results for input(s): "HGBA1C"  in the last 72 hours. CBG: No results for input(s): "GLUCAP" in the last 168 hours. Lipid Profile: No results for input(s): "CHOL", "HDL", "LDLCALC", "TRIG", "CHOLHDL", "LDLDIRECT" in the last 72 hours. Thyroid Function Tests: No results for input(s): "TSH", "T4TOTAL", "FREET4", "T3FREE", "THYROIDAB" in the last 72 hours. Anemia Panel: No results for input(s): "VITAMINB12", "FOLATE", "FERRITIN", "TIBC", "IRON", "RETICCTPCT" in the last 72 hours. Sepsis Labs: No results for input(s): "PROCALCITON", "LATICACIDVEN" in the last 168 hours.   Recent Results (from the past 240 hour(s))  Aerobic/Anaerobic Culture w Gram Stain (surgical/deep wound)     Status: None   Collection Time: 09/19/22  1:23 PM   Specimen: Abscess  Result Value Ref Range Status   Specimen Description ABSCESS ABDOMEN RIGHT  Final   Special Requests DRAIN  Final   Gram Stain   Final    ABUNDANT WBC PRESENT, PREDOMINANTLY PMN MODERATE GRAM NEGATIVE RODS    Culture   Final    ABUNDANT PROTEUS MIRABILIS RARE ENTEROBACTER AEROGENES FEW ENTEROCOCCUS FAECIUM VANCOMYCIN RESISTANT ENTEROCOCCUS ISOLATED ABUNDANT FUSOBACTERIUM NUCLEATUM BETA LACTAMASE NEGATIVE Performed at [facility], [address], [place], [place] [address]    Report Status 09/25/2022 FINAL  Final   Organism ID, Bacteria PROTEUS MIRABILIS  Final   Organism ID, Bacteria ENTEROCOCCUS FAECIUM  Final   Organism ID, Bacteria ENTEROBACTER AEROGENES  Final      Susceptibility   Enterobacter aerogenes - MIC*    CEFEPIME 2 SENSITIVE Sensitive     CEFTAZIDIME 4 SENSITIVE Sensitive     CEFTRIAXONE 1 SENSITIVE Sensitive     CIPROFLOXACIN <=0.25 SENSITIVE Sensitive     GENTAMICIN <=1 SENSITIVE Sensitive     IMIPENEM 1 SENSITIVE Sensitive     TRIMETH/SULFA <=20 SENSITIVE Sensitive     PIP/TAZO 64 INTERMEDIATE Intermediate     * RARE ENTEROBACTER AEROGENES   Enterococcus faecium - MIC*    AMPICILLIN >=32 RESISTANT Resistant     VANCOMYCIN >=32  RESISTANT Resistant     GENTAMICIN SYNERGY SENSITIVE Sensitive     * FEW ENTEROCOCCUS FAECIUM   Proteus mirabilis - MIC*    AMPICILLIN <=2 SENSITIVE Sensitive  CEFEPIME <=0.12 SENSITIVE Sensitive     CEFTAZIDIME <=1 SENSITIVE Sensitive     CEFTRIAXONE <=0.25 SENSITIVE Sensitive     CIPROFLOXACIN <=0.25 SENSITIVE Sensitive     GENTAMICIN <=1 SENSITIVE Sensitive     IMIPENEM 4 SENSITIVE Sensitive     TRIMETH/SULFA <=20 SENSITIVE Sensitive     AMPICILLIN/SULBACTAM <=2 SENSITIVE Sensitive     PIP/TAZO <=4 SENSITIVE Sensitive     * ABUNDANT PROTEUS MIRABILIS  Carbapenem Resistance Panel     Status: None   Collection Time: 09/19/22  1:23 PM  Result Value Ref Range Status   Carba Resistance IMP Gene NOT DETECTED NOT DETECTED Final   Carba Resistance VIM Gene NOT DETECTED NOT DETECTED Final   Carba Resistance NDM Gene NOT DETECTED NOT DETECTED Final   Carba Resistance KPC Gene NOT DETECTED NOT DETECTED Final   Carba Resistance OXA48 Gene NOT DETECTED NOT DETECTED Final    Comment: (NOTE) Cepheid Carba-R is an FDA-cleared nucleic acid amplification test  (NAAT)for the detection and differentiation of genes encoding the  most prevalent carbapenemases in bacterial isolate samples. Carbapenemase gene identification and implementation of comprehensive  infection control measures are recommended by the CDC to prevent the  spread of the resistant organisms. Performed at [facility], [address] [place], [place] [address]   Body fluid culture w Gram Stain     Status: None   Collection Time: 09/24/22  5:55 PM   Specimen: JP Drain; Body Fluid  Result Value Ref Range Status   Specimen Description JP DRAINAGE LEFT  Final   Special Requests NONE  Final   Gram Stain NO WBC SEEN NO ORGANISMS SEEN   Final   Culture   Final    RARE CANDIDA ALBICANS CRITICAL RESULT CALLED TO, READ BACK BY AND VERIFIED WITH: RN MICHELLE BASEL [id number] AT 1137 AM BY CM Performed at [facility]  Lab, [address] [place], [place] [address]    Report Status 09/27/2022 FINAL  Final     Radiology Studies: No results found.  Scheduled Meds:  enoxaparin (LOVENOX) injection  40 mg Subcutaneous Q24H   ferrous sulfate  325 mg Oral Daily   folic acid  1 mg Oral Daily   lipase/protease/amylase  12,000 Units Oral TID AC   magnesium oxide  400 mg Oral Daily   metroNIDAZOLE  500 mg Oral Q12H   multivitamin with minerals  1 tablet Oral Daily   nicotine  14 mg Transdermal Daily   pantoprazole  40 mg Oral BID AC   sucralfate  1 g Oral QID   thiamine  100 mg Oral Daily   vancomycin  125 mg Oral Q12H   Continuous Infusions:  ceFEPime (MAXIPIME) IV Stopped (09/28/22 1336)   DAPTOmycin (CUBICIN) 700 mg in sodium chloride 0.9 % IVPB Stopped (09/28/22 1436)     LOS: 11 days   Hughie Closs, MD [facility]  09/29/2022, 11:35 AM   *Please note that this is a verbal dictation therefore any spelling or grammatical errors are due to the "Dragon Medical One" system interpretation.  Please page via Amion and do not message via secure chat for urgent patient care matters. Secure chat can be used for non urgent patient care matters.  How to contact the [facility] Attending or Consulting provider 7A - 7P or covering provider during after hours 7P -7A, for this patient?  Check the care team in [facility] and look for a) attending/consulting TRH provider listed and b) the [facility] team  listed. Page or secure chat 7A-7P. Log into [email or web address] and use [facility] universal password to access. If you do not have the password, please contact the hospital operator. Locate the [facility] provider you are looking for under [facility] and page to a number that you can be directly reached. If you still have difficulty reaching the provider, please page the [facility] (Director on Call) for the Hospitalists listed on amion for assistance.

## 2022-09-29 NOTE — Progress Notes (Signed)
Attempt x2 to educate patient on importance of receiving medications and completing physical assessment. Unsuccessful, patient continues to refuse care/medications covered completely under the blankets. MD aware.

## 2022-09-29 NOTE — Progress Notes (Signed)
Ok to replace with 2 doses of KCL again per Dr. Jacqulyn Bath.  Ulyses Southward, PharmD, BCIDP, AAHIVP, CPP Infectious Disease Pharmacist 09/29/2022 11:37 AM

## 2022-09-30 DIAGNOSIS — K652 Spontaneous bacterial peritonitis: Secondary | ICD-10-CM | POA: Diagnosis not present

## 2022-09-30 MED ORDER — ENOXAPARIN SODIUM 40 MG/0.4ML IJ SOSY: 40.0000 mg | PREFILLED_SYRINGE | INTRAMUSCULAR | Status: DC

## 2022-09-30 MED ORDER — ENOXAPARIN SODIUM 40 MG/0.4ML IJ SOSY
40.0000 mg | PREFILLED_SYRINGE | INTRAMUSCULAR | Status: AC
  Administered 2022-09-30: 40 mg via SUBCUTANEOUS
  Filled 2022-09-30: qty 0.4

## 2022-09-30 MED ORDER — MAGNESIUM SULFATE 2 GM/50ML IV SOLN
2.0000 g | Freq: Once | INTRAVENOUS | Status: AC
  Administered 2022-09-30: 2 g via INTRAVENOUS
  Filled 2022-09-30: qty 50

## 2022-09-30 NOTE — TOC CM/SW Note (Addendum)
Per MD pt had to be re-scheduled for procedure at [facility]- procedure will now be for tomorrow 10/01/22 at 1:45- pt will need to arrive at 12:45.   CM has called [facility] to reschedule transport for tomorrow 8/6- they will arrive for pickup around 11:00am (spoke w/ Greggory Stallion)   Daytrip means the patient will transport to [facility] for a procedure than transport back to your facility once procedure is completed.         Please have patient arrive at 1245 PM on Tues. Please have transport stay at our unit for the duration of procedure so patient can be transported back in a timely fashion, and to help faciliate flow in our PACU.          The address of our unit is [address] [place] [address], GI Procedures Unit ([facility])           Please have the patient's nurse call report to Charge RN on day of procedure at [phone].         NPO at [place] day of procedure          Please update [place] if there are any acute changes in clinical condition or status that would make this patient unsuitable for transport.

## 2022-09-30 NOTE — Progress Notes (Signed)
Pharmacy Antibiotic Note  Samuel Blevins is a 57 y.o. male admitted on 08/26/2022 with decompensated liver cirrhosis, ascites and VRE peritonitis. Pharmacy was consulted for daptomycin dosing. Daptomycin is dosed at 700mg  q24. (10 mg/kg).  Also on cefepime 2g q 8 hrs.  Also with C diff on oral vancomycin. ID recommending PO vancomycin therapy until daptomycin therapy completed.  SCr 0.65  Plan: -Continue Daptomycin 700 mg IV q24h (10 mg/kg) -Monitor renal function, clinical progress, cultures/sensitivities -F/U LOT per ID, CK on Monday -Vancomycin po duration will need to be extended based on daptomycin plans  Height: 5\' 9"  (175.3 cm) Weight: 68.4 kg (150 lb 12.7 oz) IBW/kg (Calculated) : 70.7  Temp (24hrs), Avg:97.9 F (36.6 C), Min:97.6 F (36.4 C), Max:98.3 F (36.8 C)  Recent Labs  Lab 09/24/22 0603 09/26/22 0546 09/27/22 1059 09/28/22 0555 09/29/22 1020 09/30/22 0617  WBC 15.8* 10.5  --  6.9 7.9  --   CREATININE 0.57* 0.54* 0.51* 0.48* 0.52* 0.65    Estimated Creatinine Clearance: 97.4 mL/min (by C-G formula based on SCr of 0.65 mg/dL).    No Known Allergies  7/1- Bcx- negF 7/1- peritoneal fluid cx- negF 7/12 C diff + 7/12 Peritoneal washing - VRE   Cefepime 7/1 >> 7/8 Zosyn 7/12 >> 7/14 Vanc PO (C diff +) 7/12 >> (Duration pending daptomycin completion per ID recommendations) Dapto 7/14 >>    7/15 CK: 69 7/22 CK= 27 7/29 CK= 52 8/5 CK = 288  Thank you for allowing pharmacy to be a part of this patient's care.  Reece Leader, Colon Flattery, BCCP Clinical Pharmacist  09/30/2022 3:26 PM   [facility] pharmacy phone numbers are listed on [email or web address]

## 2022-09-30 NOTE — Progress Notes (Signed)
PROGRESS NOTE    Samuel Blevins  NUU:[id number] DOB: 10-25-1965 DOA: 09/18/2022 PCP: No primary care provider on file.   Brief Narrative:  57 year old with history of recurrent pancreatitis, chronic pancreatic insufficiency, HTN , recurrent alcohol use yet again admitted with acute recurrent alcoholic pancreatitis.  Initially patient had left AMA about 4-5 days prior to this admission.  Patient readmitted for acute recurrent alcoholic pancreatitis with worsening ascites, initial labs of ascites fluid indicate elevated amylase concerning for pancreatic duct disruption.     Hospital course complicated with presence of recurrent ascites, infected ascites fluid, ongoing abdominal pain and acute renal failure.  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 Being followed by GI, nephrology, and ID.    GI previously recommending transfer to outside facility for ERCP to attempt to stent pancreatic duct disruption. [facility] accepted patient for procedure on 09/17/2022.  Procedure was apparently difficult procedure with manipulation of the pancreas that ultimately had to be accessed via the gastric wall, stent was successfully placed as planned.  Patient's NG tube was instantly removed by staff during procedure then purposefully removed by patient post-anesthesia.   Patient left our facility AMA on the 24th due to "I have to go get my check" - Fortunately patient did return to our facility later in the evening and was readmitted back to our service.  CT abdomen at intake did show pneumoperitoneum as well as diffuse loculated regions in the abdomen concerning for abscess, IR asked for drain placement.  Dr. Corliss Parish GI at [facility] confirms that pneumoperitoneum is expected given their approach through the gastric wall.  Will advance diet as tolerated with ultimate plans for discharge home (patient is homeless which makes dispo  difficult) with ultimate follow-up at [facility] for repeat ERCP 2 weeks after pancreatic stenting(7/23), IR managing bilateral abdominal drains.  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 Pancreaticogastrostomy stent placed 09/17/2022 at [facility] with Dr. Corliss Parish.  Plan to repeat ERCP at their facility in 2 weeks(first full week of August) to evaluate stent and likely replace with larger diameter stent. - GI here initially recommending repeat paracentesis this week to check amylase levels and evidence of ongoing infection -will collect from his left upper quadrant JP drain given this appears to be draining purely ascites at this time. - IR (Dr Fredia Sorrow) -requires repeat CT abdomen pelvis with contrast for drains can be removed.  Which was done 09/25/2022, and shows no fluid collection at the place of catheter so her catheter is pulled out by IR on 09/26/2022. -Paracentesis 7/1 - 3L; 7/5 - 3L; 7/8 2L, 7/12 1.7 L. 7/21 600cc, -CT 24/25th consistent with loculated abdominal fluid vs abscess -drain placed by IR -Multiple Cortrak's removed by patient -dislodged again intentionally 7/23 at [facility];  -Cultures (blood and paracentesis) initially negative however cultures on repeat paracentesis from  7/12 shows vancomycin resistant enterococcus; drain  growing proteus- on daptomycin/cefepime per ID **(Patient also had been diagnosed with Cdiff - currently completed initial course but on prophylactic vanc dosing now) It looks like GI had signed off on 09/20/2022.  I was able to speak to Dr. Jamse Mead, GI at [facility].  He was scheduled to go to [facility] for upsizing of the pancreatic stent but unfortunately, he ate a small piece of breakfast at 8 AM.  Spoke to [facility] and they informed me that the procedure has to be canceled.  They have rescheduled him for tomorrow at 12:45 PM.  TOC has been informed to reschedule transportation for him as well.   Anemia of chronic disease:   Patient has chart flagged for blood product refusal but he verbally agreed to blood and wants the flag removed. 1u PRBC 7/22 transfused successfully -hemoglobin improved but dropped to 7.0 and 1 unit of PRBC transfusion was given on 09/26/2022.  Hemoglobin 8.0 posttransfusion.   Thrombocytosis -Questionably reactive in the setting of above infection/anemai.   Hypovolemic hyponatremia: Stable in low 130s range.  Hypokalemia/hypomagnesemia -Resolved.   Recurrent alcohol induced pancreatitis. Nausea vomiting, resolved - Secondary to chronic alcohol use/abuse p.o. intake as tolerated, pain control avoiding IV narcotics, antiemetics, PPI ongoing PRN   Severe protein caloric malnutrition High risk for refeeding syndrome -Complicated by chronic alcohol use   Alcohol abuse - Alcohol withdrawal protocol completed, currently outside the window for any further withdrawal symptoms.    Essential hypertension -Currently well-controlled off medications, as needed hydralazine for hypertensive events   GERD/duodenitis -Continue PPI   Acute kidney injury with hyperkalemia, resolved:    Acute hypoxic respiratory failure, transient and resolved:  Chest x-ray reported atelectasis, likely secondary to enlarging ascites. Improved s/p paracentesis   Noncompliance, profound, recurrent Acute  metabolic/toxic encephalopathy, resolved -Multiple episodes of abnormal behavior previously, multiple episodes of Cortrak removal, nursing staff noted patient eating cigarettes last week.   -Remains alert and oriented, patient previously left AMA on the 24th but did return later that day.  Not eating much.  he tells me that due to nausea and vomiting, he is not having good p.o. intake.  He keeps drinking Pepsi.  Lengthy discussion with him 09/28/2022 about not using any sort of soft drinks.  He already has Zofran as needed on board.  Per chart review, it does not look like he has been given any Zofran in last several days.  He says he is trying to eat more.  Has been refusing medications intermittently as well.  DVT prophylaxis: enoxaparin (LOVENOX) injection 40 mg Start: 09/18/22 1745   Code Status: Full Code  Family Communication:  None present at bedside.  Plan of care discussed with patient in length and he/she verbalized understanding and agreed with it.  Status is: Inpatient Remains inpatient appropriate because: Needs to go back to [facility] for replacement of the stent tomorrow.   Estimated body mass index is 22.27 kg/m as calculated from the following:   Height as of this encounter: 5\' 9"  (1.753 m).   Weight as of this encounter: 68.4 kg.    Nutritional Assessment: Body mass index is 22.27 kg/m.Marland Kitchen Seen by dietician.  I agree with the assessment and plan as outlined below: Nutrition Status:        . Skin Assessment: I have examined the patient's skin and I agree with the wound assessment as performed by the wound care RN as outlined below:    Consultants:  GI, IR  Procedures:  As above  Antimicrobials:  Anti-infectives (From admission, onward)    Start     Dose/Rate Route Frequency Ordered Stop   09/25/22 1000  metroNIDAZOLE (FLAGYL) tablet 500 mg        500 mg Oral Every 12 hours 09/25/22 0906 10/02/22 2359   09/22/22 2000  ceFEPIme (MAXIPIME) 2 g in sodium chloride 0.9 %  100 mL IVPB        2 g 200 mL/hr over 30 Minutes Intravenous Every 8 hours 09/22/22 1908 10/02/22 2359   09/20/22 2200  vancomycin (VANCOCIN) 50 mg/mL oral solution SOLN 125 mg        125 mg Oral Every 12 hours 09/20/22 0909 10/09/22 2359   09/20/22 1000  cefTRIAXone (ROCEPHIN) 2 g in sodium chloride 0.9 % 100 mL IVPB  Status:  Discontinued        2 g 200 mL/hr over 30 Minutes Intravenous Every 24 hours 09/20/22 0909 09/22/22 1858   09/18/22 2300  metroNIDAZOLE (FLAGYL) IVPB 500 mg  Status:  Discontinued        500 mg 100 mL/hr over 60 Minutes Intravenous Every 12 hours 09/18/22 2200 09/19/22 0910   09/18/22 2300  ceFEPIme (MAXIPIME) 2 g in sodium chloride 0.9 % 100 mL IVPB  Status:  Discontinued        2 g 200 mL/hr over 30 Minutes Intravenous Every 8 hours 09/18/22 2210 09/19/22 0910   09/18/22 1815  DAPTOmycin (CUBICIN) 700 mg in sodium chloride 0.9 % IVPB        10 mg/kg  70.7 kg (Adjusted) 128 mL/hr over 30 Minutes Intravenous Daily 09/18/22 1803 10/02/22 2359   09/18/22 1800  vancomycin (VANCOCIN) 50 mg/mL oral solution SOLN 125 mg  Status:  Discontinued        125 mg Oral Every 6 hours 09/18/22 1738 09/20/22 0909         Subjective: Seen and examined.  He has no complaints.  Objective: Vitals:   09/29/22 1950 09/30/22 0000 09/30/22 0416 09/30/22 0831  BP: 134/89 129/73 139/83 (!) 149/91  Pulse: (!) 109 (!) 104 98 (!) 112  Resp: 18 18 17    Temp: 98.1 F (36.7 C) 97.6 F (36.4 C) 97.7 F (36.5 C)   TempSrc: Oral Oral Oral Oral  SpO2: 100% 98% 100% 99%  Weight:      Height:        Intake/Output Summary (Last 24 hours) at 09/30/2022 1105 Last data filed at 09/30/2022 0833 Gross per 24 hour  Intake 363.97 ml  Output 1300 ml  Net -936.03 ml   Filed Weights   09/18/22 1520  Weight: 68.4 kg    Examination:  General exam: Appears calm and comfortable  Respiratory system: Clear to auscultation. Respiratory effort normal. Cardiovascular system: S1 & S2 heard, RRR.  No JVD, murmurs, rubs, gallops or clicks. No pedal edema. Gastrointestinal system: Abdomen is nondistended, soft and nontender. No organomegaly or masses felt. Normal bowel sounds heard. Central nervous system: Alert and oriented. No focal neurological deficits. Extremities: Symmetric 5 x 5 power. Skin: No rashes, lesions or ulcers.   Data Reviewed: I have personally reviewed following labs and imaging studies  CBC: Recent Labs  Lab 09/24/22 0603 09/26/22 0546 09/27/22 1551 09/28/22 0555 09/29/22 1020  WBC 15.8* 10.5  --  6.9 7.9  NEUTROABS  --   --   --   --  4.9  HGB 7.2* 7.0* 8.0* 8.0* 8.1*  HCT 22.4* 21.2* 24.5* 24.8* 25.0*  MCV  83.3 84.1  --  83.8 83.1  PLT 845* 779*  --  699* 696*   Basic Metabolic Panel: Recent Labs  Lab 09/26/22 0546 09/27/22 1059 09/28/22 0555 09/29/22 1020 09/29/22 1455 09/30/22 0617  NA 131* 130* 131* 133*  --  129*  K 2.9* 3.4* 3.0* 3.4* 3.5 4.7  CL 96* 99 101 101  --  99  CO2 18* 19* 20* 20*  --  20*  GLUCOSE 102* 89 93 77  --  110*  BUN <5* <5* <5* <5*  --  <5*  CREATININE 0.54* 0.51* 0.48* 0.52*  --  0.65  CALCIUM 8.6* 8.5* 8.4* 8.8*  --  8.9  MG  --  1.2*  --   --  1.1* 1.7   GFR: Estimated Creatinine Clearance: 97.4 mL/min (by C-G formula based on SCr of 0.65 mg/dL). Liver Function Tests: No results for input(s): "AST", "ALT", "ALKPHOS", "BILITOT", "PROT", "ALBUMIN" in the last 168 hours.  No results for input(s): "LIPASE", "AMYLASE" in the last 168 hours. No results for input(s): "AMMONIA" in the last 168 hours. Coagulation Profile: No results for input(s): "INR", "PROTIME" in the last 168 hours.  Cardiac Enzymes: Recent Labs  Lab 09/30/22 0617  CKTOTAL 288   BNP (last 3 results) No results for input(s): "PROBNP" in the last 8760 hours. HbA1C: No results for input(s): "HGBA1C" in the last 72 hours. CBG: No results for input(s): "GLUCAP" in the last 168 hours. Lipid Profile: No results for input(s): "CHOL", "HDL",  "LDLCALC", "TRIG", "CHOLHDL", "LDLDIRECT" in the last 72 hours. Thyroid Function Tests: No results for input(s): "TSH", "T4TOTAL", "FREET4", "T3FREE", "THYROIDAB" in the last 72 hours. Anemia Panel: No results for input(s): "VITAMINB12", "FOLATE", "FERRITIN", "TIBC", "IRON", "RETICCTPCT" in the last 72 hours. Sepsis Labs: No results for input(s): "PROCALCITON", "LATICACIDVEN" in the last 168 hours.   Recent Results (from the past 240 hour(s))  Body fluid culture w Gram Stain     Status: None   Collection Time: 09/24/22  5:55 PM   Specimen: JP Drain; Body Fluid  Result Value Ref Range Status   Specimen Description JP DRAINAGE LEFT  Final   Special Requests NONE  Final   Gram Stain NO WBC SEEN NO ORGANISMS SEEN   Final   Culture   Final    [facility] CRITICAL RESULT CALLED TO, READ BACK BY AND VERIFIED WITH: RN MICHELLE BASEL [id number] AT 1137 AM BY CM Performed at [facility], [address] [place], [place] [address]    Report Status 09/27/2022 FINAL  Final     Radiology Studies: No results found.  Scheduled Meds:  enoxaparin (LOVENOX) injection  40 mg Subcutaneous Q24H   ferrous sulfate  325 mg Oral Daily   folic acid  1 mg Oral Daily   lipase/protease/amylase  12,000 Units Oral TID AC   magnesium oxide  400 mg Oral Daily   metroNIDAZOLE  500 mg Oral Q12H   multivitamin with minerals  1 tablet Oral Daily   nicotine  14 mg Transdermal Daily   pantoprazole  40 mg Oral BID AC   sucralfate  1 g Oral QID   thiamine  100 mg Oral Daily   vancomycin  125 mg Oral Q12H   Continuous Infusions:  ceFEPime (MAXIPIME) IV 2 g (09/30/22 0423)   DAPTOmycin (CUBICIN) 700 mg in sodium chloride 0.9 % IVPB Stopped (09/29/22 1533)     LOS: 12 days   Hughie Closs, MD [facility]  09/30/2022, 11:05 AM   *  Please note that this is a verbal dictation therefore any spelling or grammatical errors are due to the "Dragon Medical One" system interpretation.  Please page  via Amion and do not message via secure chat for urgent patient care matters. Secure chat can be used for non urgent patient care matters.  How to contact the [facility] Attending or Consulting provider 7A - 7P or covering provider during after hours 7P -7A, for this patient?  Check the care team in [facility] and look for a) attending/consulting [facility] provider listed and b) the [facility] team listed. Page or secure chat 7A-7P. Log into [email or web address] and use [place] universal password to access. If you do not have the password, please contact the hospital operator. Locate the [facility] provider you are looking for under [facility] and page to a number that you can be directly reached. If you still have difficulty reaching the provider, please page the [facility] (Director on Call) for the Hospitalists listed on amion for assistance.

## 2022-09-30 NOTE — Plan of Care (Signed)

## 2022-09-30 NOTE — Progress Notes (Signed)
Patient refused 1030 vital signs check.

## 2022-10-01 DIAGNOSIS — K652 Spontaneous bacterial peritonitis: Secondary | ICD-10-CM

## 2022-10-01 LAB — BASIC METABOLIC PANEL
Anion gap: 13 (ref 5–15)
BUN: <5 mg/dL — ABNORMAL LOW (ref 6–20)
CO2: 20 mmol/L — ABNORMAL LOW (ref 22–32)
Calcium: 9.1 mg/dL (ref 8.9–10.3)
Chloride: 97 mmol/L — ABNORMAL LOW (ref 98–111)
Creatinine, Ser: 0.55 mg/dL — ABNORMAL LOW (ref 0.61–1.24)
GFR, Estimated: >60 mL/min (ref >60)
Glucose, Bld: 91 mg/dL (ref 70–99)
Potassium: 4.1 mmol/L (ref 3.5–5.1)
Sodium: 130 mmol/L — ABNORMAL LOW (ref 135–145)

## 2022-10-01 LAB — MAGNESIUM: Magnesium: 1.4 mg/dL — ABNORMAL LOW (ref 1.7–2.4)

## 2022-10-01 MED ORDER — MAGNESIUM SULFATE 4 GM/100ML IV SOLN
4.0000 g | Freq: Once | INTRAVENOUS | Status: AC
  Administered 2022-10-01: 4 g via INTRAVENOUS
  Filled 2022-10-01: qty 100

## 2022-10-01 NOTE — Progress Notes (Signed)
Called report to [facility] [phone]. [facility] transported ot. Thomas Hoff, RN

## 2022-10-01 NOTE — Progress Notes (Signed)
PROGRESS NOTE    Samuel Blevins  QIH:[id number] DOB: 12/05/1965 DOA: 09/18/2022 PCP: No primary care provider on file.   Brief Narrative:  57 year old with history of recurrent pancreatitis, chronic pancreatic insufficiency, HTN , recurrent alcohol use yet again admitted with acute recurrent alcoholic pancreatitis.  Initially patient had left AMA about 4-5 days prior to this admission.  Patient readmitted for acute recurrent alcoholic pancreatitis with worsening ascites, initial labs of ascites fluid indicate elevated amylase concerning for pancreatic duct disruption.     Hospital course complicated with presence of recurrent ascites, infected ascites fluid, ongoing abdominal pain and acute renal failure.  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 Being followed by GI, nephrology, and ID.    GI previously recommending transfer to outside facility for ERCP to attempt to stent pancreatic duct disruption. [facility] accepted patient for procedure on 09/17/2022.  Procedure was apparently difficult procedure with manipulation of the pancreas that ultimately had to be accessed via the gastric wall, stent was successfully placed as planned.  Patient's NG tube was instantly removed by staff during procedure then purposefully removed by patient post-anesthesia.   Patient left our facility AMA on the 24th due to "I have to go get my check" - Fortunately patient did return to our facility later in the evening and was readmitted back to our service.  CT abdomen at intake did show pneumoperitoneum as well as diffuse loculated regions in the abdomen concerning for abscess, IR asked for drain placement.  Dr. Corliss Parish GI at [facility] confirms that pneumoperitoneum is expected given their approach through the gastric wall.  Will advance diet as tolerated with ultimate plans for discharge home (patient is homeless which makes dispo  difficult) with ultimate follow-up at [facility] for repeat ERCP 2 weeks after pancreatic stenting(7/23), IR managing bilateral abdominal drains.  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 Pancreaticogastrostomy stent placed 09/17/2022 at [facility] with Dr. Corliss Parish.  Plan to repeat ERCP at their facility in 2 weeks(first full week of August) to evaluate stent and likely replace with larger diameter stent. - GI here initially recommending repeat paracentesis this week to check amylase levels and evidence of ongoing infection -will collect from his left upper quadrant JP drain given this appears to be draining purely ascites at this time. - IR (Dr Fredia Sorrow) -requires repeat CT abdomen pelvis with contrast for drains can be removed.  Which was done 09/25/2022, and shows no fluid collection at the place of catheter so her catheter is pulled out by IR on 09/26/2022. -Paracentesis 7/1 - 3L; 7/5 - 3L; 7/8 2L, 7/12 1.7 L. 7/21 600cc, -CT 24/25th consistent with loculated abdominal fluid vs abscess -drain placed by IR -Multiple Cortrak's removed by patient -dislodged again intentionally 7/23 at [facility];  -Cultures (blood and paracentesis) initially negative however cultures on repeat paracentesis from  7/12 shows vancomycin resistant enterococcus; drain  growing proteus- on daptomycin/cefepime per ID **(Patient also had been diagnosed with Cdiff - currently completed initial course but on prophylactic vanc dosing now) It looks like GI had signed off on 09/20/2022.  I was able to speak to Dr. Jamse Mead, GI at [facility].  He is scheduled to go to [facility] for upsizing of the pancreatic stent today.   Anemia of chronic disease:   Patient has chart flagged for blood product refusal but he verbally agreed to blood and wants the flag removed. 1u PRBC 7/22 transfused successfully -hemoglobin improved but dropped to 7.0 and 1 unit of PRBC transfusion was given on 09/26/2022.  Hemoglobin 8.0 posttransfusion.   Thrombocytosis -Questionably reactive in the setting of above infection/anemai.   Hypovolemic hyponatremia: Stable in low 130s range.  Hypokalemia/hypomagnesemia Potassium more.  Magnesium 1.4, labs just resulted.     Recurrent alcohol induced pancreatitis. Nausea vomiting, resolved - Secondary to chronic alcohol use/abuse p.o. intake as tolerated, pain control avoiding IV narcotics, antiemetics, PPI ongoing PRN   Severe protein caloric malnutrition High risk for refeeding syndrome -Complicated by chronic alcohol use   Alcohol abuse - Alcohol withdrawal protocol completed, currently outside the window for any further withdrawal symptoms.    Essential hypertension -Currently well-controlled off medications, as needed hydralazine for hypertensive events   GERD/duodenitis -Continue PPI   Acute kidney injury with hyperkalemia, resolved:    Acute hypoxic respiratory failure, transient and resolved:  Chest x-ray reported atelectasis, likely secondary to enlarging ascites. Improved s/p paracentesis   Noncompliance, profound, recurrent Acute metabolic/toxic encephalopathy, resolved -Multiple episodes of abnormal behavior previously, multiple episodes of Cortrak removal, nursing staff noted patient eating cigarettes last week.   -Remains alert and  oriented, patient previously left AMA on the 24th but did return later that day.  Not eating much.  he tells me that due to nausea and vomiting, he is not having good p.o. intake.  He keeps drinking Pepsi.  Lengthy discussion with him 09/28/2022 about not using any sort of soft drinks.  He already has Zofran as needed on board.  Per chart review, it does not look like he has been given any Zofran in last several days.  He says he is trying to eat more.  Has been refusing medications intermittently as well.  DVT prophylaxis:    Code Status: Full Code  Family Communication:  None present at bedside.  Plan of care discussed with patient in length and he/she verbalized understanding and agreed with it.  Status is: Inpatient Remains inpatient appropriate because: Going to [facility] today for the procedure as mentioned above.  Potential discharge in next 1 to 2 days.   Estimated body mass index is 22.27 kg/m as calculated from the following:   Height as of this encounter: 5\' 9"  (1.753 m).   Weight as of this encounter: 68.4 kg.    Nutritional Assessment: Body mass index is 22.27 kg/m.Marland Kitchen Seen by dietician.  I agree with the assessment and plan as outlined below: Nutrition Status:        . Skin Assessment: I have examined the patient's skin and I agree with the wound assessment as performed by the wound care RN as outlined below:    Consultants:  GI, IR  Procedures:  As above  Antimicrobials:  Anti-infectives (From admission, onward)    Start     Dose/Rate Route Frequency Ordered Stop   09/25/22 1000  metroNIDAZOLE (FLAGYL) tablet 500 mg  500 mg Oral Every 12 hours 09/25/22 0906 10/02/22 2359   09/22/22 2000  ceFEPIme (MAXIPIME) 2 g in sodium chloride 0.9 % 100 mL IVPB        2 g 200 mL/hr over 30 Minutes Intravenous Every 8 hours 09/22/22 1908 10/02/22 2359   09/20/22 2200  vancomycin (VANCOCIN) 50 mg/mL oral solution SOLN 125 mg        125 mg Oral Every 12 hours 09/20/22 0909  10/09/22 2359   09/20/22 1000  cefTRIAXone (ROCEPHIN) 2 g in sodium chloride 0.9 % 100 mL IVPB  Status:  Discontinued        2 g 200 mL/hr over 30 Minutes Intravenous Every 24 hours 09/20/22 0909 09/22/22 1858   09/18/22 2300  metroNIDAZOLE (FLAGYL) IVPB 500 mg  Status:  Discontinued        500 mg 100 mL/hr over 60 Minutes Intravenous Every 12 hours 09/18/22 2200 09/19/22 0910   09/18/22 2300  ceFEPIme (MAXIPIME) 2 g in sodium chloride 0.9 % 100 mL IVPB  Status:  Discontinued        2 g 200 mL/hr over 30 Minutes Intravenous Every 8 hours 09/18/22 2210 09/19/22 0910   09/18/22 1815  DAPTOmycin (CUBICIN) 700 mg in sodium chloride 0.9 % IVPB        10 mg/kg  70.7 kg (Adjusted) 128 mL/hr over 30 Minutes Intravenous Daily 09/18/22 1803 10/02/22 2359   09/18/22 1800  vancomycin (VANCOCIN) 50 mg/mL oral solution SOLN 125 mg  Status:  Discontinued        125 mg Oral Every 6 hours 09/18/22 1738 09/20/22 0909         Subjective: Seen and examined this morning.  No complaints.  Objective: Vitals:   10/01/22 0400 10/01/22 0822 10/01/22 0955 10/01/22 1025  BP:  132/71 132/71 132/71  Pulse: (!) 105 (!) 110 (!) 110 (!) 110  Resp:  16 16 16   Temp:  97.7 F (36.5 C) 97.7 F (36.5 C) 97.7 F (36.5 C)  TempSrc:  Oral    SpO2:  98% 98% 98%  Weight:      Height:        Intake/Output Summary (Last 24 hours) at 10/01/2022 1115 Last data filed at 10/01/2022 0350 Gross per 24 hour  Intake 320 ml  Output 2200 ml  Net -1880 ml   Filed Weights   09/18/22 1520  Weight: 68.4 kg    Examination:  General exam: Appears calm and comfortable  Respiratory system: Clear to auscultation. Respiratory effort normal. Cardiovascular system: S1 & S2 heard, RRR. No JVD, murmurs, rubs, gallops or clicks. No pedal edema. Gastrointestinal system: Abdomen is nondistended, soft and nontender. No organomegaly or masses felt. Normal bowel sounds heard. Central nervous system: Alert and oriented. No focal  neurological deficits. Extremities: Symmetric 5 x 5 power. Skin: No rashes, lesions or ulcers.   Data Reviewed: I have personally reviewed following labs and imaging studies  CBC: Recent Labs  Lab 09/26/22 0546 09/27/22 1551 09/28/22 0555 09/29/22 1020  WBC 10.5  --  6.9 7.9  NEUTROABS  --   --   --  4.9  HGB 7.0* 8.0* 8.0* 8.1*  HCT 21.2* 24.5* 24.8* 25.0*  MCV 84.1  --  83.8 83.1  PLT 779*  --  699* 696*   Basic Metabolic Panel: Recent Labs  Lab 09/27/22 1059 09/28/22 0555 09/29/22 1020 09/29/22 1455 09/30/22 0617 10/01/22 0839  NA 130* 131* 133*  --  129* 130*  K 3.4* 3.0*  3.4* 3.5 4.7 4.1  CL 99 101 101  --  99 97*  CO2 19* 20* 20*  --  20* 20*  GLUCOSE 89 93 77  --  110* 91  BUN <5* <5* <5*  --  <5* <5*  CREATININE 0.51* 0.48* 0.52*  --  0.65 0.55*  CALCIUM 8.5* 8.4* 8.8*  --  8.9 9.1  MG 1.2*  --   --  1.1* 1.7 1.4*   GFR: Estimated Creatinine Clearance: 97.4 mL/min (A) (by C-G formula based on SCr of 0.55 mg/dL (L)). Liver Function Tests: No results for input(s): "AST", "ALT", "ALKPHOS", "BILITOT", "PROT", "ALBUMIN" in the last 168 hours.  No results for input(s): "LIPASE", "AMYLASE" in the last 168 hours. No results for input(s): "AMMONIA" in the last 168 hours. Coagulation Profile: No results for input(s): "INR", "PROTIME" in the last 168 hours.  Cardiac Enzymes: Recent Labs  Lab 09/30/22 0617  CKTOTAL 288   BNP (last 3 results) No results for input(s): "PROBNP" in the last 8760 hours. HbA1C: No results for input(s): "HGBA1C" in the last 72 hours. CBG: No results for input(s): "GLUCAP" in the last 168 hours. Lipid Profile: No results for input(s): "CHOL", "HDL", "LDLCALC", "TRIG", "CHOLHDL", "LDLDIRECT" in the last 72 hours. Thyroid Function Tests: No results for input(s): "TSH", "T4TOTAL", "FREET4", "T3FREE", "THYROIDAB" in the last 72 hours. Anemia Panel: No results for input(s): "VITAMINB12", "FOLATE", "FERRITIN", "TIBC", "IRON",  "RETICCTPCT" in the last 72 hours. Sepsis Labs: No results for input(s): "PROCALCITON", "LATICACIDVEN" in the last 168 hours.   Recent Results (from the past 240 hour(s))  Body fluid culture w Gram Stain     Status: None   Collection Time: 09/24/22  5:55 PM   Specimen: JP Drain; Body Fluid  Result Value Ref Range Status   Specimen Description JP DRAINAGE LEFT  Final   Special Requests NONE  Final   Gram Stain NO WBC SEEN NO ORGANISMS SEEN   Final   Culture   Final    [facility] CRITICAL RESULT CALLED TO, READ BACK BY AND VERIFIED WITH: RN MICHELLE BASEL [id number] AT 1137 AM BY CM Performed at [facility], [address], [place], [place] [address]    Report Status 09/27/2022 FINAL  Final     Radiology Studies: No results found.  Scheduled Meds:  ferrous sulfate  325 mg Oral Daily   folic acid  1 mg Oral Daily   lipase/protease/amylase  12,000 Units Oral TID AC   magnesium oxide  400 mg Oral Daily   metroNIDAZOLE  500 mg Oral Q12H   multivitamin with minerals  1 tablet Oral Daily   nicotine  14 mg Transdermal Daily   pantoprazole  40 mg Oral BID AC   sucralfate  1 g Oral QID   thiamine  100 mg Oral Daily   vancomycin  125 mg Oral Q12H   Continuous Infusions:  ceFEPime (MAXIPIME) IV 2 g (10/01/22 0415)   DAPTOmycin (CUBICIN) 700 mg in sodium chloride 0.9 % IVPB Stopped (09/30/22 1539)     LOS: 13 days   Hughie Closs, MD [facility]  10/01/2022, 11:15 AM   *Please note that this is a verbal dictation therefore any spelling or grammatical errors are due to the "Dragon Medical One" system interpretation.  Please page via Amion and do not message via secure chat for urgent patient care matters. Secure chat can be used for non urgent patient care matters.  How to contact the [facility] Attending or  Consulting provider 7A - 7P or covering provider during after hours 7P -7A, for this patient?  Check the care team in [facility] and look for a) attending/consulting  [facility] provider listed and b) the [facility] team listed. Page or secure chat 7A-7P. Log into [email or web address] and use [facility] universal password to access. If you do not have the password, please contact the hospital operator. Locate the [facility] provider you are looking for under [facility] and page to a number that you can be directly reached. If you still have difficulty reaching the provider, please page the [facility] (Director on Call) for the Hospitalists listed on amion for assistance.

## 2022-10-02 ENCOUNTER — Other Ambulatory Visit (HOSPITAL_COMMUNITY): Payer: Self-pay

## 2022-10-02 DIAGNOSIS — K652 Spontaneous bacterial peritonitis: Secondary | ICD-10-CM | POA: Diagnosis not present

## 2022-10-02 MED ORDER — SUCRALFATE 1 G PO TABS
1.0000 g | ORAL_TABLET | ORAL | 1 refills | Status: DC
  Filled 2022-10-02: qty 120, 30d supply, fill #0

## 2022-10-02 MED ORDER — VANCOMYCIN HCL 125 MG PO CAPS
125.0000 mg | ORAL_CAPSULE | Freq: Two times a day (BID) | ORAL | 0 refills | Status: DC
  Filled 2022-10-02: qty 14, 7d supply, fill #0

## 2022-10-02 MED ORDER — THIAMINE HCL 100 MG PO TABS
100.0000 mg | ORAL_TABLET | Freq: Every day | ORAL | 1 refills | Status: DC
  Filled 2022-10-02: qty 30, 30d supply, fill #0

## 2022-10-02 MED ORDER — LINEZOLID 600 MG PO TABS
600.0000 mg | ORAL_TABLET | Freq: Two times a day (BID) | ORAL | Status: AC
  Administered 2022-10-02: 600 mg via ORAL
  Filled 2022-10-02: qty 1

## 2022-10-02 MED ORDER — FOLIC ACID 1 MG PO TABS
1.0000 mg | ORAL_TABLET | Freq: Every day | ORAL | 1 refills | Status: DC
Start: 2022-10-02 — End: 2022-11-06
  Filled 2022-10-02: qty 30, 30d supply, fill #0

## 2022-10-02 MED ORDER — IRON (FERROUS SULFATE) 325 (65 FE) MG PO TABS
325.0000 mg | ORAL_TABLET | Freq: Every day | ORAL | 0 refills | Status: DC
  Filled 2022-10-02: qty 100, 100d supply, fill #0

## 2022-10-02 MED ORDER — NICOTINE 14 MG/24HR TD PT24
14.0000 mg | MEDICATED_PATCH | Freq: Every day | TRANSDERMAL | 0 refills | Status: DC
  Filled 2022-10-02: qty 28, 28d supply, fill #0

## 2022-10-02 MED ORDER — CARVEDILOL 3.125 MG PO TABS
3.1250 mg | ORAL_TABLET | Freq: Two times a day (BID) | ORAL | 1 refills | Status: DC
  Filled 2022-10-02: qty 60, 30d supply, fill #0

## 2022-10-02 MED ORDER — CIPROFLOXACIN HCL 500 MG PO TABS
750.0000 mg | ORAL_TABLET | Freq: Two times a day (BID) | ORAL | Status: AC
  Administered 2022-10-02: 750 mg via ORAL
  Filled 2022-10-02: qty 2

## 2022-10-02 MED ORDER — PANTOPRAZOLE SODIUM 40 MG PO TBEC
40.0000 mg | DELAYED_RELEASE_TABLET | Freq: Two times a day (BID) | ORAL | 1 refills | Status: AC
  Filled 2022-10-02: qty 60, 30d supply, fill #0

## 2022-10-02 MED ORDER — MAGNESIUM OXIDE 400 MG PO TABS
400.0000 mg | ORAL_TABLET | Freq: Two times a day (BID) | ORAL | 0 refills | Status: DC
  Filled 2022-10-02: qty 120, 60d supply, fill #0

## 2022-10-02 MED ORDER — PANCRELIPASE (LIP-PROT-AMYL) 12000-38000 UNITS PO CPEP
12000.0000 [IU] | ORAL_CAPSULE | Freq: Three times a day (TID) | ORAL | 2 refills | Status: AC
  Filled 2022-10-02: qty 90, 30d supply, fill #0

## 2022-10-02 MED ORDER — HYDROXYZINE HCL 25 MG PO TABS
25.0000 mg | ORAL_TABLET | Freq: Every evening | ORAL | 0 refills | Status: AC | PRN
  Filled 2022-10-02: qty 30, 30d supply, fill #0

## 2022-10-02 NOTE — Discharge Summary (Signed)
Physician Discharge Summary  Samuel Blevins GNF:[id number] DOB: [birth year] DOA: 09/18/2022  PCP: No primary care provider on file.  Admit date: 09/18/2022 Discharge date: 10/02/2022 30 Day Unplanned Readmission Risk Score    Flowsheet Row ED to Hosp-Admission (Current) from 09/18/2022 in [facility] 4E CV SURGICAL PROGRESSIVE CARE  30 Day Unplanned Readmission Risk Score (%) 62.87 Filed at 10/02/2022 0801       This score is the patient's risk of an unplanned readmission within 30 days of being discharged (0 -100%). The score is based on dignosis, age, lab data, medications, orders, and past utilization.   Low:  0-14.9   Medium: 15-21.9   High: 22-29.9   Extreme: 30 and above          Admitted From: Home Disposition: Home  Recommendations for Outpatient Follow-up:  Follow up with PCP in 1-2 weeks Please obtain BMP/CBC in one week Follow-up with [facility] GI in 1 month for repeat EGD and stent removal. Please follow up with your PCP on the following pending results: Unresulted Labs (From admission, onward)     Start     Ordered   10/02/22 0000  CBC With Differential  (IP RX VANCOMYCIN DISCHARGE PANEL)  R       Comments: Sunday/Monday    10/02/22 1027   10/02/22 0000  Basic metabolic panel  (IP RX VANCOMYCIN DISCHARGE PANEL)  R       Comments: Sunday/Monday and Thursday    10/02/22 1027   10/02/22 0000  Vancomycin Trough, Serum  (IP RX VANCOMYCIN DISCHARGE PANEL)  R       Comments: Sunday/Monday and Thursday    10/02/22 1027   10/02/22 0000  Sedimentation rate  (IP RX VANCOMYCIN DISCHARGE PANEL)  R       Comments: Every other week    10/02/22 1027   10/02/22 0000  C-reactive protein  (IP RX VANCOMYCIN DISCHARGE PANEL)  R       Comments: Every other week    10/02/22 1027   09/23/22 0500  CK  Weekly,   R      07 /24/24 1808              Home Health: None Equipment/Devices: None  Discharge Condition: Stable CODE STATUS: Full code Diet recommendation:  Cardiac  Subjective: Seen and examined.  No complaints.  Very eager to go home.  He pulled his IV out already.  Brief/Interim Summary: 57 year old with history of recurrent pancreatitis, chronic pancreatic insufficiency, HTN , recurrent alcohol use yet again admitted with acute recurrent alcoholic pancreatitis.  Initially patient had left AMA about 4-5 days prior to this admission.  Patient readmitted for acute recurrent alcoholic pancreatitis with worsening ascites, initial labs of ascites fluid indicate elevated amylase concerning for pancreatic duct disruption.     Hospital course complicated with presence of recurrent ascites, infected ascites fluid, ongoing abdominal pain and acute renal failure.  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  Was followed by GI, nephrology, and ID.    GI previously recommending transfer to outside facility for ERCP to attempt to stent pancreatic duct disruption. [facility] accepted patient for procedure on 09/17/2022.  Procedure was apparently difficult procedure with manipulation of the pancreas that ultimately had to be accessed via the gastric wall, stent was successfully placed as planned.  Patient's NG tube was instantly removed by staff during procedure then purposefully removed by patient post-anesthesia.   Patient left our facility  AMA on the 24th due to "I have to go get my check" - Fortunately patient did return to our facility later in the evening and was readmitted back to our service.  With major diagnosis of Decompensated liver cirrhosis with ascitesSepsis secondary to VRE positive pancreatic ascites, Proteus species positive abdominal abscess , Cdiff Colitis, sepsis was present on admission and was evidenced by tachycardia and leukocytosis.  CT abdomen at intake did show pneumoperitoneum as well as diffuse loculated regions in the abdomen concerning for abscess, IR asked  for drain placement.  Dr. Corliss Blevins GI at [facility] confirmed that pneumoperitoneum is expected given their approach through the gastric wall.  During prolonged hospitalization, patient had several paracentesis as below. -Paracentesis 7/1 - 3L; 7/5 - 3L; 7/8 2L, 7/12 1.7 L. 7/21 600cc, -CT 24/25th consistent with loculated abdominal fluid vs abscess -drain placed by IR -Multiple Cortrak's removed by patient -dislodged again intentionally 7/23 at [facility];  -Cultures (blood and paracentesis) initially negative however cultures on repeat paracentesis from 7/12 shows vancomycin resistant enterococcus; drain growing proteus- on daptomycin/cefepime per ID **(Patient also had been diagnosed with Cdiff -  completed initial course but on prophylactic vanc dosing now) Patient went to [facility] GI and underwent upsizing of pancreatic stent by Dr. Jamse Blevins on 10/01/2022.  His labs today are looking better.  They have recommended that he follows up with them in a month for repeat EGD and removal of the stent.  Patient is doing fine with no complaints, tolerating diet and eager to go home.  Patient has lost his IV access so he is going to get oral alternative antibiotics instead of cefepime and IV Flagyl.  Per ID recommendations, today will be his last dose of those antibiotics and he will be discharged on prophylactic oral vancomycin for 1 week for C. difficile history.   Anemia of chronic disease:   Patient has chart flagged for blood product refusal but he verbally agreed to blood and wants the flag removed. 1u PRBC 7/22 transfused successfully -hemoglobin improved but dropped to 7.0 and 1 unit of PRBC transfusion was given on 09/26/2022.  Hemoglobin 8.0 posttransfusion.   Thrombocytosis -Questionably reactive in the setting of above infection/anemai.   Hypovolemic hyponatremia: Stable in low 130s range.   Hypokalemia/hypomagnesemia Resolved.   Recurrent alcohol induced pancreatitis. Nausea vomiting, resolved - Secondary to  chronic alcohol use/abuse p.o. intake as tolerated, pain control avoiding IV narcotics, antiemetics, PPI ongoing PRN   Severe protein caloric malnutrition High risk for refeeding syndrome -Complicated by chronic alcohol use   Alcohol abuse - Alcohol withdrawal protocol completed, currently outside the window for any further withdrawal symptoms.    Essential hypertension -Currently well-controlled off medications, PTA, he was on amlodipine and Coreg.  His heart rate is slightly elevated so I will resume low-dose of Coreg but discontinue amlodipine.   GERD/duodenitis -Continue PPI   Acute kidney injury with hyperkalemia, resolved:    Acute hypoxic respiratory failure, transient and resolved:  Chest x-ray reported atelectasis, likely secondary to enlarging ascites. Improved s/p paracentesis   Noncompliance, profound, recurrent Acute metabolic/toxic encephalopathy, resolved -Multiple episodes of abnormal behavior previously, multiple episodes of Cortrak removal, nursing staff noted patient eating cigarettes last week.   -Remains alert and oriented, patient previously left AMA on the 24th but did return later that day.  Not eating much.  he tells me that due to nausea and vomiting, he is not having good p.o. intake.  He keeps drinking Pepsi.  Lengthy discussion with him  09/28/2022 about not using any sort of soft drinks.  He already has Zofran as needed on board.  Per chart review, it does not look like he has been given any Zofran in last several days.  He says he is trying to eat more.  He continued to refuse medications intermittently.  He continued to drink Pepsi as he pleased.  Discharge plan was discussed with patient and/or family member and they verbalized understanding and agreed with it.  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   Creon 12000-38000 units Cpep capsule Generic drug: lipase/protease/amylase Take 1 capsule (12,000 Units total) by mouth 3 (three) times daily before meals.   folic acid 1 MG tablet Commonly known as: FOLVITE Take 1 tablet (1 mg total) by mouth daily.   hydrOXYzine 25 MG tablet Commonly known as: ATARAX Take 25 mg by mouth at bedtime as needed for anxiety.   Iron (Ferrous Sulfate) 325 (65 Fe) MG Tabs Take 1 tablet ( 325 mg) by mouth daily.   magnesium oxide 400 MG tablet Commonly known as: MAG-OX Take 1 tablet (400 mg total) by mouth in the morning and at bedtime.   multivitamin with minerals Tabs tablet Take 1 tablet by mouth daily.   nicotine 14 mg/24hr patch Commonly known as: NICODERM CQ - dosed in mg/24 hours Place 1 patch (14 mg total) onto the skin daily.   ondansetron 8 MG tablet Commonly known as: ZOFRAN Take 8 mg by mouth every 6 (six) hours as needed for nausea or vomiting.   pantoprazole 40 MG tablet Commonly known as: PROTONIX Take 1 tablet (40 mg total) by mouth 2 (two) times daily before a meal.   polyethylene glycol 17 g packet Commonly known as: MIRALAX / GLYCOLAX Take 17 g by mouth daily as needed for mild constipation or moderate constipation.   sucralfate 1 g tablet Commonly known as:  CARAFATE Take 1 g by mouth See admin instructions. Take 1 tablet by mouth 4 times daily with meals and bedtime   thiamine 100 MG tablet Commonly known as: VITAMIN B1 Take 1 tablet (100 mg total) by mouth daily.   vancomycin 125 MG capsule Commonly known as: VANCOCIN Take 1 capsule (125 mg total) by mouth in the morning and at bedtime for 7 days.        Follow-up Information     pcp Follow up in 1 week(s).          [facility] Follow up in 1 month(s).   Contact information: [address] [place] [address] [phone]  No Known Allergies  Consultations: GI nephrology.,  ID, radiology, general surgery   Procedures/Studies: CT ABDOMEN PELVIS W CONTRAST  Result Date: 09/26/2022 CLINICAL DATA:  History of chronic pancreatitis and status post placement percutaneous drainage catheters in to right-sided and left-sided loculated abdominal fluid collections on 09/19/2022. EXAM: CT ABDOMEN AND PELVIS WITH CONTRAST TECHNIQUE: Multidetector CT imaging of the abdomen and pelvis was performed using the standard protocol following bolus administration of intravenous contrast. RADIATION DOSE REDUCTION: This exam was performed according to the departmental dose-optimization program which includes automated exposure control, adjustment of the mA and/or kV according to patient size and/or use of iterative reconstruction technique. CONTRAST:  75mL OMNIPAQUE IOHEXOL 350 MG/ML SOLN COMPARISON:  09/19/2022 and 09/18/2022. FINDINGS: Lower chest: Small right pleural effusion with right basilar atelectasis/airspace disease. Hepatobiliary: No focal liver abnormality is seen. No gallstones, gallbladder wall thickening, or biliary dilatation. Reduced amount of free fluid adjacent to the dome of the liver. Pancreas: Normal pancreatic enhancement without evidence of necrosis, pancreatic mass, calcifications or pancreatic ductal dilatation.  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 No signs of pseudocyst infection. The pancreatic ductal stent that extends from the duodenum through the head and body of the pancreas and all the way to the level of the stomach shows stable positioning. Spleen: Normal in size without focal abnormality. Adrenals/Urinary Tract: Adrenal glands are unremarkable. Kidneys are normal, without renal calculi, focal lesion, or hydronephrosis. Bladder is unremarkable. Stomach/Bowel: Bowel shows no evidence of obstruction, ileus, inflammation or lesion. The appendix is not discretely visualized. No free intraperitoneal air. Vascular/Lymphatic: Stable atherosclerosis of the abdominal aorta without aneurysm. No enlarged lymph nodes identified. Reproductive: Prostate is unremarkable. Other: Indwelling right-sided percutaneous abdominal drainage catheter along the inferior margin of the liver and left-sided peritoneal drainage catheter in the anterior left lower quadrant remain with no residual fluid collections at the level of the catheters. No new fluid collections identified. Musculoskeletal: No acute or significant osseous findings. IMPRESSION: 1. Further maturation and slight enlargement of the dominant pseudocyst adjacent to the anterior body and tail of the pancreas and extending into the lesser sac. No signs of pseudocyst infection. 2. Stable positioning of pancreatic ductal stent. 3. Indwelling right-sided percutaneous abdominal drainage catheter along the inferior margin of the liver and left-sided peritoneal drainage catheter in the anterior left lower quadrant remain with no residual fluid collections at the level of the  catheters. No new fluid collections identified. 4. Decrease in free fluid adjacent to the dome of the liver. 5. Small right pleural effusion with right basilar atelectasis/airspace disease. 6. Aortic atherosclerosis. Electronically Signed   By: Irish Lack M.D.   On: 09/26/2022 09:12   CT GUIDED PERITONEAL/RETROPERITONEAL FLUID DRAIN BY PERC CATH  Result Date: 09/19/2022 INDICATION: 16109 Abscess [id number] EXAM: CT-GUIDED PERCUTANEOUS ABSCESS DRAINAGE CATHETER PLACEMENT AT RIGHT UPPER QUADRANT AND LEFT LOWER QUADRANTS COMPARISON:  CT AP, 09/18/2022 MEDICATIONS: The patient is currently admitted to the hospital and receiving intravenous antibiotics. The antibiotics were administered within an appropriate time frame prior to the initiation of the procedure. ANESTHESIA/SEDATION: Moderate (conscious) sedation was employed during this procedure. A total of Versed 2 mg and Fentanyl 75 mcg was administered intravenously. Moderate Sedation Time: 45 minutes. The patient's  level of consciousness and vital signs were monitored continuously by radiology nursing throughout the procedure under my direct supervision. CONTRAST:  None COMPLICATIONS: None immediate. PROCEDURE: RADIATION DOSE REDUCTION: This exam was performed according to the departmental dose-optimization program which includes automated exposure control, adjustment of the mA and/or kV according to patient size and/or use of iterative reconstruction technique. Informed written consent was obtained from the patient and/or patient's representative after a discussion of the risks, benefits and alternatives to treatment. The patient was placed supine on the CT gantry and a pre procedural CT was performed re-demonstrating the known abscess/fluid collection within the RIGHT upper and LEFT lower quadrants. The procedure was planned. A timeout was performed prior to the initiation of the procedure. The RIGHT and LEFT abdomen was prepped and draped in the usual sterile  fashion. The procedure began at the patient's RIGHT upper quadrant. The overlying soft tissues were anesthetized with 1% lidocaine with epinephrine. Appropriate trajectory was planned with the use of a 22 gauge spinal needle. An 18 gauge trocar needle was advanced into the abscess/fluid collection and a short Amplatz super stiff wire was coiled within the collection. Appropriate positioning was confirmed with a limited CT scan. The tract was serially dilated allowing placement of a 12 Fr drainage catheter. The procedure was repeated at the patient's LEFT lower quadrant, allowing placement of a 12 Fr drainage catheter. Appropriate positioning was confirmed with a limited postprocedural CT scan. A representative sample of 5 ml of purulent fluid was aspirated. The tubes were connected to a bulb suction and sutured in place. A dressing was placed. The patient tolerated the procedure well without immediate post procedural complication. IMPRESSION: Successful CT guided placement of a 12 Fr drainage catheters into RIGHT upper and LEFT lower quadrant abscesses. Aspiration of 5 mL sample of purulent fluid. Samples were sent to the laboratory as requested by the ordering clinical team Roanna Banning, MD [facility] Radiology Electronically Signed   By: Roanna Banning M.D.   On: 09/19/2022 17:32   CT GUIDED PERITONEAL/RETROPERITONEAL FLUID DRAIN BY PERC CATH  Result Date: 09/19/2022 INDICATION: 16109 Abscess [id number] EXAM: CT-GUIDED PERCUTANEOUS ABSCESS DRAINAGE CATHETER PLACEMENT AT RIGHT UPPER QUADRANT AND LEFT LOWER QUADRANTS COMPARISON:  CT AP, 09/18/2022 MEDICATIONS: The patient is currently admitted to the hospital and receiving intravenous antibiotics. The antibiotics were administered within an appropriate time frame prior to the initiation of the procedure. ANESTHESIA/SEDATION: Moderate (conscious) sedation was employed during this procedure. A total of Versed 2 mg and  Fentanyl 75 mcg was administered intravenously. Moderate Sedation Time: 45 minutes. The patient's level of consciousness and vital signs were monitored continuously by radiology nursing throughout the procedure under my direct supervision. CONTRAST:  None COMPLICATIONS: None immediate. PROCEDURE: RADIATION DOSE REDUCTION: This exam was performed according to the departmental dose-optimization program which includes automated exposure control, adjustment of the mA and/or kV according to patient size and/or use of iterative reconstruction technique. Informed written consent was obtained from the patient and/or patient's representative after a discussion of the risks, benefits and alternatives to treatment. The patient was placed supine on the CT gantry and a pre procedural CT was performed re-demonstrating the known abscess/fluid collection within the RIGHT upper and LEFT lower quadrants. The procedure was planned. A timeout was performed prior to the initiation of the procedure. The RIGHT and LEFT abdomen was prepped and draped in the usual sterile fashion. The procedure began at the patient's RIGHT upper quadrant. The overlying soft tissues were  anesthetized with 1% lidocaine with epinephrine. Appropriate trajectory was planned with the use of a 22 gauge spinal needle. An 18 gauge trocar needle was advanced into the abscess/fluid collection and a short Amplatz super stiff wire was coiled within the collection. Appropriate positioning was confirmed with a limited CT scan. The tract was serially dilated allowing placement of a 12 Fr drainage catheter. The procedure was repeated at the patient's LEFT lower quadrant, allowing placement of a 12 Fr drainage catheter. Appropriate positioning was confirmed with a limited postprocedural CT scan. A representative sample of 5 ml of purulent fluid was aspirated. The tubes were connected to a bulb suction and sutured in place. A dressing was placed. The patient tolerated the  procedure well without immediate post procedural complication. IMPRESSION: Successful CT guided placement of a 12 Fr drainage catheters into RIGHT upper and LEFT lower quadrant abscesses. Aspiration of 5 mL sample of purulent fluid. Samples were sent to the laboratory as requested by the ordering clinical team Roanna Banning, MD [facility] Radiology Electronically Signed   By: Roanna Banning M.D.   On: 09/19/2022 17:32   CT ABDOMEN PELVIS WO CONTRAST  Result Date: 09/19/2022 CLINICAL DATA:  evaluate for perforated viscous EXAM: CT ABDOMEN AND PELVIS WITHOUT CONTRAST TECHNIQUE: Multidetector CT imaging of the abdomen and pelvis was performed following the standard protocol without IV contrast. RADIATION DOSE REDUCTION: This exam was performed according to the departmental dose-optimization program which includes automated exposure control, adjustment of the mA and/or kV according to patient size and/or use of iterative reconstruction technique. COMPARISON:  09/18/2022.  08/26/2022. FINDINGS: Lower chest: Moderate right pleural effusion. Right lower lobe and right middle lobe airspace disease could reflect atelectasis or infiltrate/pneumonia. Coronary artery and aortic calcifications. Hepatobiliary: No focal hepatic abnormality. Gallbladder unremarkable. Pancreas: Pancreatic ductal stent in place terminating in the stomach. No visible focal pancreatic abnormality. No surrounding inflammation. Spleen: No focal abnormality.  Normal size. Adrenals/Urinary Tract: No adrenal abnormality. No focal renal abnormality. No stones or hydronephrosis. Urinary bladder is unremarkable. Stomach/Bowel: Stomach, large and small bowel grossly unremarkable. Vascular/Lymphatic: Aortic atherosclerosis. No evidence of aneurysm or adenopathy. Reproductive: No visible focal abnormality. Other: Moderate ascites in the abdomen and pelvis. Multiple focal fluid collections are noted along the  greater curvature of the stomach, inferior to the right hepatic lobe, and in the left mid to lower abdomen. Pneumoperitoneum again noted, stable. Musculoskeletal: No acute bony abnormality. IMPRESSION: Continued pneumoperitoneum. No obvious source of the free air noted. No abnormal bowel. Therefore, this is favored to be related to recent paracentesis. Moderate ascites. Multiple loculated fluid collections as described above. These could reflect loculated ascites, less likely abscess is. The sub a pathic fluid collection or the left lower anterior abdominal fluid collection would be amenable to sampling if felt clinically relevant. Small to moderate right pleural effusion. Compressive atelectasis versus pneumonia in the right middle lobe and right lower lobe. Aortoiliac atherosclerosis. Electronically Signed   By: Charlett Nose M.D.   On: 09/19/2022 01:56   CT ABDOMEN PELVIS W CONTRAST  Result Date: 09/18/2022 CLINICAL DATA:  Abdominal pain EXAM: CT ABDOMEN AND PELVIS WITH CONTRAST TECHNIQUE: Multidetector CT imaging of the abdomen and pelvis was performed using the standard protocol following bolus administration of intravenous contrast. RADIATION DOSE REDUCTION: This exam was performed according to the departmental dose-optimization program which includes automated exposure control, adjustment of the mA and/or kV according to patient size and/or use of iterative reconstruction technique. CONTRAST:  75mL  OMNIPAQUE IOHEXOL 350 MG/ML SOLN COMPARISON:  08/26/2022 FINDINGS: Lower chest: There is interval increase in size of right pleural effusion. There is infiltrate with air bronchograms in right lower lung field suggesting worsening of atelectasis. Subsegmental atelectasis is seen in the posterior left lower lung field. There is ectasia of bronchi in the lower lung fields. Hepatobiliary: No focal abnormalities are seen in the liver. There is no dilation of bile ducts. Gallbladder is not distended. Pancreas:  Catheter is seen in head and body pancreas extending into the stomach and second portion of duodenum. There is no significant dilation of pancreatic duct. Small calcifications are seen in the head of the pancreas. Spleen: Unremarkable. Adrenals/Urinary Tract: Adrenals are unremarkable. There is no hydronephrosis. There are no renal or ureteral stones. Urinary bladder is unremarkable. Stomach/Bowel: Stomach is unremarkable. There is no significant dilation of small bowel loops. Appendix is not distinctly visualized. There is no wall thickening in colon. Vascular/Lymphatic: Scattered arterial calcifications are seen in aorta and its major branches. Reproductive: Unremarkable. Other: There is moderate ascites with interval decrease. There is 11.2 x 6.1 cm loculated fluid collection with thick wall inferior to the liver. There is loculated fluid collection between stomach and pancreas measuring proximally 11.8 cm in maximum diameter without thick wall. There is 9.8 by 4.4 cm loculated fluid collection in the left lower quadrant abdomen with slightly thickened wall. There are pockets pneumoperitoneum in upper and mid abdomen. Musculoskeletal: Degenerative changes are noted in lumbar spine, most severe at L5-S1 level. This finding has not changed. IMPRESSION: There is no evidence of intestinal obstruction. There is no hydronephrosis. There are multiple pockets of pneumoperitoneum in upper and mid abdomen. This may be related to recent paracentesis or suggest bowel perforation or infectious peritonitis. There is interval decrease in amount of ascites. Still, there is moderate residual ascites in abdomen and pelvis. There is a loculated thick-walled fluid collection inferior to the right lobe of liver measuring 11.2 cm in maximum diameter. There is another loculated fluid collection measuring 9.8 cm in maximum diameter with slightly thickened wall in left mid to lower abdomen. Possibility of infectious process in the  peritoneal cavity is not excluded. Please correlate with laboratory findings and consider sampling of loculated fluid in right subhepatic location. There is interval worsening of infiltrates in the lower lung fields suggesting atelectasis/pneumonia. There is interval increase in right pleural effusion. Catheter is noted extending from the stomach through the pancreas to the duodenum. Electronically Signed   By: Ernie Avena M.D.   On: 09/18/2022 18:45   DG Chest Port 1 View  Result Date: 09/18/2022 CLINICAL DATA:  Sepsis EXAM: PORTABLE CHEST 1 VIEW COMPARISON:  X-ray 09/05/2022 and older FINDINGS: Normal cardiopericardial silhouette. Calcified aorta. Left lung is clear. No left-sided consolidation, pneumothorax or effusion. Overlapping cardiac leads. There is a small to moderate right pleural effusion with some adjacent opacity. No edema. There is no clear pneumothorax identified although there is horizontal appearance of the margin of the pleural effusion. Hydropneumothorax is not excluded with this appearance. IMPRESSION: Developing small to moderate right pleural effusion with some adjacent opacity. In addition there is a horizontal orientation to the pleural effusion. Although no pneumothorax is seen, a hydropneumothorax is not excluded with this appearance. Recommend follow up chest CT to further delineate when appropriate. Electronically Signed   By: Karen Kays M.D.   On: 09/18/2022 17:23   US Paracentesis  Result Date: 09/15/2022 INDICATION: Patient with pancreatitis, recurrent ascites. Request for therapeutic paracentesis. EXAM:  ULTRASOUND GUIDED THERAPEUTIC PARACENTESIS MEDICATIONS: 10 mL 1% lidocaine COMPLICATIONS: None immediate. PROCEDURE: Informed written consent was obtained from the patient after a discussion of the risks, benefits and alternatives to treatment. A timeout was performed prior to the initiation of the procedure. Initial ultrasound scanning demonstrates a moderate amount  of loculated-appearing ascites within the left lateral abdomen. The left lateral abdomen was prepped and draped in the usual sterile fashion. 1% lidocaine was used for local anesthesia. Following this, a 19 gauge, 7-cm, Yueh catheter was introduced. An ultrasound image was saved for documentation purposes. The paracentesis was performed. The catheter was removed and a dressing was applied. The patient tolerated the procedure well without immediate post procedural complication. FINDINGS: A total of approximately 600 mL of dark, yellow fluid was removed. IMPRESSION: Successful ultrasound-guided paracentesis yielding 600 mL of peritoneal fluid. Performed by: Loyce Dys PA-C Electronically Signed   By: Malachy Moan M.D.   On: 09/15/2022 15:06   DG Abd Portable 1V  Result Date: 09/13/2022 CLINICAL DATA:  [id number] Encounter for feeding tube placement [id number] EXAM: PORTABLE ABDOMEN - 1 VIEW COMPARISON:  09/02/2022. FINDINGS: The bowel gas pattern is non-obstructive. No evidence of pneumoperitoneum. No acute osseous abnormalities. Enteric tube is seen with its tip overlying the left paramedian lower abdomen, likely in the region of duodenal jejunal junction. There is at least small right pleural effusion, new since the prior study. IMPRESSION: 1. Enteric tube tip likely in the region of the duodenal jejunal junction. 2. At least small right pleural effusion, new since the prior study. Electronically Signed   By: Jules Schick M.D.   On: 09/13/2022 10:27   US RENAL  Result Date: 09/08/2022 CLINICAL DATA:  Acute kidney injury, decompensated cirrhosis EXAM: RENAL / URINARY TRACT ULTRASOUND COMPLETE COMPARISON:  None Available. FINDINGS: Right Kidney: Renal measurements: 10.8 x 6.0 x 6.0 cm = volume: 195 mL. Echogenicity within normal limits. The lower pole the right kidney is obscured by overlying bowel gas. No mass or hydronephrosis visualized. Left Kidney: Renal measurements: 12.8 x 6.8 x 6.0 cm = volume: 222  mL. Echogenicity within normal limits. The upper pole of the left kidney is obscured by overlying bowel gas and osseous structures. No mass or hydronephrosis visualized. Bladder: Appears normal for degree of bladder distention. Other: Mild perihepatic ascites is present. IMPRESSION: 1. No hydronephrosis. 2. Mild ascites. Electronically Signed   By: Helyn Numbers M.D.   On: 09/08/2022 22:05   IR Paracentesis  Result Date: 09/06/2022 INDICATION: 57 year old male inpatient. History of alcohol abuse with chronic pancreatitis. Admitted for acute pancreatitis. Team is requesting therapeutic and diagnostic paracentesis EXAM: ULTRASOUND GUIDED THERAPEUTIC AND DIAGNOSTIC PARACENTESIS MEDICATIONS: Lidocaine 1% 10 mL COMPLICATIONS: None immediate. PROCEDURE: Informed written consent was obtained from the patient after a discussion of the risks, benefits and alternatives to treatment. A timeout was performed prior to the initiation of the procedure. Initial ultrasound scanning demonstrates a small amount of ascites within the right lower abdominal quadrant. The right lower abdomen was prepped and draped in the usual sterile fashion. 1% lidocaine was used for local anesthesia. Following this, a 19 gauge, 7-cm, Yueh catheter was introduced. An ultrasound image was saved for documentation purposes. The paracentesis was performed. The catheter was removed and a dressing was applied. The patient tolerated the procedure well without immediate post procedural complication. FINDINGS: A total of approximately 1.7 L of amber fluid was removed. Samples were sent to the laboratory as requested by the clinical team. IMPRESSION: Successful ultrasound-guided therapeutic  and diagnostic paracentesis yielding 1.7 liters of peritoneal fluid. Performed by: Anders Grant, NP Electronically Signed   By: Richarda Overlie M.D.   On: 09/06/2022 10:07   DG Chest Port 1 View  Result Date: 09/05/2022 CLINICAL DATA:  Tachycardia EXAM: PORTABLE  CHEST 1 VIEW COMPARISON:  Chest x-ray 08/26/2022 FINDINGS: Enteric tube extends below the diaphragm. Cardiomediastinal silhouette is within normal limits. There is minimal bibasilar atelectasis and small left pleural effusion. There is no pneumothorax or acute fracture. IMPRESSION: Minimal bibasilar atelectasis and small left pleural effusion. Electronically Signed   By: Darliss Cheney M.D.   On: 09/05/2022 22:25   IR Paracentesis  Result Date: 09/02/2022 INDICATION: Patient with a history of alcohol abuse, chronic pancreatitis admitted with acute pancreatitis and found to have new onset ascites. Interventional radiology asked to perform a diagnostic and therapeutic paracentesis EXAM: ULTRASOUND GUIDED PARACENTESIS MEDICATIONS: 1% lidocaine 20 mL COMPLICATIONS: None immediate. PROCEDURE: Informed written consent was obtained from the patient after a discussion of the risks, benefits and alternatives to treatment. A timeout was performed prior to the initiation of the procedure. Initial ultrasound scanning demonstrates a large amount of ascites within the right lower abdominal quadrant. The right lower abdomen was prepped and draped in the usual sterile fashion. 1% lidocaine was used for local anesthesia. Following this, a 19 gauge, 7-cm, Yueh catheter was introduced. An ultrasound image was saved for documentation purposes. The paracentesis was performed. The catheter was removed and a dressing was applied. The patient tolerated the procedure well without immediate post procedural complication. FINDINGS: A total of approximately 1.8 L of blood-tinged fluid was removed. Samples were sent to the laboratory as requested by the clinical team. IMPRESSION: Successful ultrasound-guided paracentesis yielding 1.8 liters of peritoneal fluid. Procedure performed by Alwyn Ren NP and supervised by Dr. Lowella Dandy. Electronically Signed   By: Richarda Overlie M.D.   On: 09/02/2022 17:27   DG Abd Portable 1V  Result Date:  09/02/2022 CLINICAL DATA:  57 year old male feeding tube placement. EXAM: PORTABLE ABDOMEN - 1 VIEW COMPARISON:  08/30/2022 and earlier. FINDINGS: Portable AP view at 1044 hours. Enteric feeding tube courses from the visible lower chest into the abdomen, crosses midline and conforms to the duodenum C-loop. The tip is in the midline at the level of the distal duodenum. Paucity of bowel gas in the upper abdomen. Patchy right lung base opacity, not significantly changed from CT Abdomen and Pelvis on 08/26/2022. IMPRESSION: Enteric feeding tube tip at the level of the distal duodenum. Electronically Signed   By: Odessa Fleming M.D.   On: 09/02/2022 11:21     Discharge Exam: Vitals:   10/01/22 2317 10/02/22 0427  BP: 125/71 138/69  Pulse: (!) 118 (!) 106  Resp: 20 20  Temp: 97.8 F (36.6 C) 97.6 F (36.4 C)  SpO2: 100% 95%   Vitals:   10/01/22 1518 10/01/22 1953 10/01/22 2317 10/02/22 0427  BP: 131/89 124/83 125/71 138/69  Pulse: (!) 112 (!) 112 (!) 118 (!) 106  Resp: 14 17 20 20   Temp: 97.9 F (36.6 C) 98 F (36.7 C) 97.8 F (36.6 C) 97.6 F (36.4 C)  TempSrc: Oral Oral Oral Oral  SpO2: 98% 97% 100% 95%  Weight:      Height:        General: Pt is alert, awake, not in acute distress Cardiovascular: RRR, S1/S2 +, no rubs, no gallops Respiratory: CTA bilaterally, no wheezing, no rhonchi Abdominal: Soft, NT, ND, bowel sounds + Extremities: no edema, no  cyanosis    The results of significant diagnostics from this hospitalization (including imaging, microbiology, ancillary and laboratory) are listed below for reference.     Microbiology: Recent Results (from the past 240 hour(s))  Body fluid culture w Gram Stain     Status: None   Collection Time: 09/24/22  5:55 PM   Specimen: JP Drain; Body Fluid  Result Value Ref Range Status   Specimen Description JP DRAINAGE LEFT  Final   Special Requests NONE  Final   Gram Stain NO WBC SEEN NO ORGANISMS SEEN   Final   Culture   Final    RARE  [facility] CRITICAL RESULT CALLED TO, READ BACK BY AND VERIFIED WITH: RN MICHELLE BASEL [id number] AT 1137 AM BY CM Performed at [facility], [address] [place], [place] [address]    Report Status 09/27/2022 FINAL  Final     Labs: BNP (last 3 results) Recent Labs    06/16/22 0340 06/17/22 0431 09/05/22 2217  BNP 130.1* 115.9* 85.7   Basic Metabolic Panel: Recent Labs  Lab 09/27/22 1059 09/28/22 0555 09/29/22 1020 09/29/22 1455 09/30/22 0617 10/01/22 0839 10/02/22 0019  NA 130* 131* 133*  --  129* 130* 129*  K 3.4* 3.0* 3.4* 3.5 4.7 4.1 4.3  CL 99 101 101  --  99 97* 99  CO2 19* 20* 20*  --  20* 20* 17*  GLUCOSE 89 93 77  --  110* 91 105*  BUN <5* <5* <5*  --  <5* <5* 6  CREATININE 0.51* 0.48* 0.52*  --  0.65 0.55* 0.70  CALCIUM 8.5* 8.4* 8.8*  --  8.9 9.1 8.8*  MG 1.2*  --   --  1.1* 1.7 1.4* 1.9   Liver Function Tests: Recent Labs  Lab 10/02/22 0019  AST 39  ALT 22  ALKPHOS 103  BILITOT 0.7  PROT 8.1  ALBUMIN 2.4*   No results for input(s): "LIPASE", "AMYLASE" in the last 168 hours. No results for input(s): "AMMONIA" in the last 168 hours. CBC: Recent Labs  Lab 09/26/22 0546 09/27/22 1551 09/28/22 0555 09/29/22 1020 10/02/22 0019  WBC 10.5  --  6.9 7.9 5.5  NEUTROABS  --   --   --  4.9 4.4  HGB 7.0* 8.0* 8.0* 8.1* 8.9*  HCT 21.2* 24.5* 24.8* 25.0* 26.9*  MCV 84.1  --  83.8 83.1 85.9  PLT 779*  --  699* 696* 625*   Cardiac Enzymes: Recent Labs  Lab 09/30/22 0617  CKTOTAL 288   BNP: Invalid input(s): "POCBNP" CBG: No results for input(s): "GLUCAP" in the last 168 hours. D-Dimer No results for input(s): "DDIMER" in the last 72 hours. Hgb A1c No results for input(s): "HGBA1C" in the last 72 hours. Lipid Profile No results for input(s): "CHOL", "HDL", "LDLCALC", "TRIG", "CHOLHDL", "LDLDIRECT" in the last 72 hours. Thyroid function studies No results for input(s): "TSH", "T4TOTAL", "T3FREE", "THYROIDAB" in the last 72  hours.  Invalid input(s): "FREET3" Anemia work up No results for input(s): "VITAMINB12", "FOLATE", "FERRITIN", "TIBC", "IRON", "RETICCTPCT" in the last 72 hours. Urinalysis    Component Value Date/Time   COLORURINE YELLOW 09/18/2022 1804   APPEARANCEUR HAZY (A) 09/18/2022 1804   LABSPEC 1.015 09/18/2022 1804   PHURINE 5.0 09/18/2022 1804   GLUCOSEU NEGATIVE 09/18/2022 1804   HGBUR NEGATIVE 09/18/2022 1804   BILIRUBINUR NEGATIVE 09/18/2022 1804   KETONESUR NEGATIVE 09/18/2022 1804   PROTEINUR 30 (A) 09/18/2022 1804   NITRITE NEGATIVE 09/18/2022 1804  LEUKOCYTESUR NEGATIVE 09/18/2022 1804   Sepsis Labs Recent Labs  Lab 09/26/22 0546 09/28/22 0555 09/29/22 1020 10/02/22 0019  WBC 10.5 6.9 7.9 5.5   Microbiology Recent Results (from the past 240 hour(s))  Body fluid culture w Gram Stain     Status: None   Collection Time: 09/24/22  5:55 PM   Specimen: JP Drain; Body Fluid  Result Value Ref Range Status   Specimen Description JP DRAINAGE LEFT  Final   Special Requests NONE  Final   Gram Stain NO WBC SEEN NO ORGANISMS SEEN   Final   Culture   Final    RARE [facility]BIC[facility] CRITICAL RESULT CALLED TO, READ BACK BY AND VERIFIED WITH: RN MICHELLE BASEL [id number] AT 1137 AM BY CM Performed at [facility], [address] [place], [place] [address]    Report Status 09/27/2022 FINAL  Final    FURTHER DISCHARGE INSTRUCTIONS:   Get Medicines reviewed and adjusted: Please take all your medications with you for your next visit with your Primary MD   Laboratory/radiological data: Please request your Primary MD to go over all hospital tests and procedure/radiological results at the follow up, please ask your Primary MD to get all Hospital records sent to his/her office.   In some cases, they will be blood work, cultures and biopsy results pending at the time of your discharge. Please request that your primary care M.D. goes through all the records of your hospital data and  follows up on these results.   Also Note the following: If you experience worsening of your admission symptoms, develop shortness of breath, life threatening emergency, suicidal or homicidal thoughts you must seek medical attention immediately by calling 911 or calling your MD immediately  if symptoms less severe.   You must read complete instructions/literature along with all the possible adverse reactions/side effects for all the Medicines you take and that have been prescribed to you. Take any new Medicines after you have completely understood and accpet all the possible adverse reactions/side effects.    Do not drive when taking Pain medications or sleeping medications (Benzodaizepines)   Do not take more than prescribed Pain, Sleep and Anxiety Medications. It is not advisable to combine anxiety,sleep and pain medications without talking with your primary care practitioner   Special Instructions: If you have smoked or chewed Tobacco  in the last 2 yrs please stop smoking, stop any regular Alcohol  and or any Recreational drug use.   Wear Seat belts while driving.   Please note: You were cared for by a hospitalist during your hospital stay. Once you are discharged, your primary care physician will handle any further medical issues. Please note that NO REFILLS for any discharge medications will be authorized once you are discharged, as it is imperative that you return to your primary care physician (or establish a relationship with a primary care physician if you do not have one) for your post hospital discharge needs so that they can reassess your need for medications and monitor your lab values  Time coordinating discharge: Over 30 minutes  SIGNED:   Hughie Closs, MD  [facility] 10/02/2022, 10:27 AM *Please note that this is a verbal dictation therefore any spelling or grammatical errors are due to the "Dragon Medical One" system interpretation. If 7PM-7AM, please contact  night-coverage [email or web address]

## 2022-10-02 NOTE — Progress Notes (Signed)
Patient was told earlier he had to wait until 1230 to get his carafate then he could be discharged.  However, patient was not in his room when I entered his room at 1220.  I spoke to the charge nurse and said the patient had left with his paperwork.  Patient's ride was not here, but he walked to the ED to smoke a cigarette outside.  He was told by the charge nurse to pick up his medications from the outpatient pharmacy.

## 2022-10-02 NOTE — Plan of Care (Addendum)
Plan of care has been reviewed.  Problem: Health Behavior/Discharge Planning: Goal: Ability to manage health-related needs will improve Outcome: Progressing, however, Pt wants to leave AMA tomorrow am. We will discuss with team in the morning round.    Problem: Clinical Measurements: Goal: Ability to maintain clinical measurements within normal limits will improve Outcome: Progressing   Problem: Clinical Measurements: Goal: Will remain free from infection Outcome: Progressing   Problem: Clinical Measurements: Goal: Respiratory complications will improve Outcome: Progressing: on room air, no respiratory distress.   Problem: Clinical Measurements: Goal: Cardiovascular complication will be avoided Outcome: Progressing: stable hemodynamically.    Problem: Nutrition: Goal: Adequate nutrition will be maintained Outcome: Progressing, Pt has great appetite today.   Problem: Elimination: Goal: Will not experience complications related to bowel motility Outcome: Progressing, Pt reports having two lose stool today.    Problem: Pain Managment: Goal: General experience of comfort will improve Outcome: Progressing   Problem: Skin Integrity: Goal: Risk for impaired skin integrity will decrease Outcome: Progressing   Filiberto Pinks, RN

## 2022-10-02 NOTE — Plan of Care (Signed)

## 2022-10-02 NOTE — TOC Transition Note (Signed)
Transition of Care (TOC) - CM/SW Discharge Note Donn Pierini RN, BSN Transitions of Care Unit 4E- RN Case Manager See Treatment Team for direct phone #   Patient Details  Name: Samuel Blevins MRN: [id number] Date of Birth: Oct 01, 1965  [facility]) CM/SW Contact:  Darrold Span, RN Phone Number: 10/02/2022, 12:17 PM   Clinical Narrative:    Pt stable for transition back to shelter today- CM spoke with pt at bedside- and pt verbalized he has spoken to [facility] and plan to return there.  Bus Pass provided to pt, however pt also voiced that he has a ride that will be here at noon to assist him in going back to [facility].   TOC pharmacy to fill meds - it has been explained to pt that he needs to wait for his meds prior to leaving, pt is adamant that he will wait outside.   CM also received VM from [facility] CM- Tad Moore- call return call attempted and secure VM left with pt's plan to return to shelter.   No further TOC needs noted.    Final next level of care: Homeless Shelter Barriers to Discharge: Barriers Resolved   Patient Goals and CMS Choice   Choice offered to / list presented to : NA  Discharge Placement                   Shelter      Discharge Plan and Services Additional resources added to the After Visit Summary for   In-house Referral: Clinical Social Work Discharge Planning Services: CM Consult Post Acute Care Choice: NA          DME Arranged: N/A DME Agency: NA       HH Arranged: NA HH Agency: NA        Social Determinants of Health (SDOH) Interventions SDOH Screenings   Food Insecurity: Food Insecurity Present (09/19/2022)  Housing: High Risk (09/19/2022)  Transportation Needs: Unmet Transportation Needs (09/19/2022)  Utilities: At Risk (09/19/2022)  Depression (PHQ2-9): High Risk (02/14/2022)  Financial Resource Strain: Not on File (06/14/2021)   Received from [place], [place]  Physical  Activity: Not on File (06/14/2021)   Received from [place], [place]  Social Connections: Not on File (06/14/2021)   Received from [place], [place]  Stress: Not on File (06/14/2021)   Received from [place], [place]  Tobacco Use: High Risk (09/19/2022)     Readmission Risk Interventions    10/02/2022   12:17 PM 09/04/2022   11:40 AM  Readmission Risk Prevention Plan  Transportation Screening Complete Complete  Medication Review (RN Care Manager) Complete Complete  PCP or Specialist appointment within 3-5 days of discharge  Complete  HRI or Home Care Consult Complete Complete  SW Recovery Care/Counseling Consult  Complete  Palliative Care Screening  Not Applicable  Skilled Nursing Facility  Not Applicable

## 2022-10-02 NOTE — TOC Benefit Eligibility Note (Signed)
Patient Product/process development scientist completed.    The patient is insured through [employer] [place].     Ran test claim for vancomycin 125 mc capsules and the current 10 day co-pay is $4.00.   This test claim was processed through [facility]- copay amounts may vary at other pharmacies due to pharmacy/plan contracts, or as the patient moves through the different stages of their insurance plan.     Roland Earl, CPHT Pharmacy Patient Advocate Specialist [facility] Patient Advocate Team Direct Number: [phone]  Fax: [phone]

## 2022-10-02 NOTE — Progress Notes (Signed)
Pt took paperwork without being reviewed.  Pt is to pick up d/c meds from the [facility] pharmacy

## 2022-10-03 ENCOUNTER — Encounter: Payer: Self-pay | Admitting: Physician Assistant

## 2022-10-03 ENCOUNTER — Other Ambulatory Visit: Payer: Self-pay

## 2022-10-03 ENCOUNTER — Other Ambulatory Visit: Payer: Self-pay | Admitting: Critical Care Medicine

## 2022-10-03 MED ORDER — ONDANSETRON HCL 8 MG PO TABS
8.0000 mg | ORAL_TABLET | Freq: Three times a day (TID) | ORAL | 1 refills | Status: DC | PRN
  Filled 2022-10-03: qty 30, 10d supply, fill #0

## 2022-10-03 MED ORDER — TRAMADOL HCL 50 MG PO TABS
50.0000 mg | ORAL_TABLET | Freq: Three times a day (TID) | ORAL | 0 refills | Status: DC | PRN
  Filled 2022-10-03: qty 30, 10d supply, fill #0

## 2022-10-03 NOTE — Progress Notes (Signed)
Pt d/c'd from [facility] 08/07  He had a procedure at [facility], then was transferred back to [facility] and discharged from there.  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  Tube sites have dressings, no bleeding or bruising.   Today's Vitals   10/03/22 1406  BP: 126/82  Pulse: (!) 112  SpO2: 95%   He complains of nausea and abd pain.   Tramadol 50 mg q 8 hr prn and Zofran prn.   F/u will be arranged.  Theodore Demark, PA-C 10/03/2022 2:20 PM

## 2022-10-08 ENCOUNTER — Inpatient Hospital Stay (HOSPITAL_COMMUNITY)
Admission: EM | Admit: 2022-10-08 | Discharge: 2022-10-12 | DRG: 438 | Disposition: A | Discharge status: Home / Self Care | Payer: MEDICAID | Attending: Internal Medicine | Admitting: Internal Medicine

## 2022-10-08 ENCOUNTER — Emergency Department (HOSPITAL_COMMUNITY): Payer: MEDICAID

## 2022-10-08 ENCOUNTER — Encounter (HOSPITAL_COMMUNITY): Payer: Self-pay

## 2022-10-08 DIAGNOSIS — Z8619 Personal history of other infectious and parasitic diseases: Secondary | ICD-10-CM | POA: Diagnosis not present

## 2022-10-08 DIAGNOSIS — Z6822 Body mass index (BMI) 22.0-22.9, adult: Secondary | ICD-10-CM

## 2022-10-08 DIAGNOSIS — F1010 Alcohol abuse, uncomplicated: Secondary | ICD-10-CM | POA: Diagnosis present

## 2022-10-08 DIAGNOSIS — R569 Unspecified convulsions: Secondary | ICD-10-CM

## 2022-10-08 DIAGNOSIS — E876 Hypokalemia: Secondary | ICD-10-CM | POA: Diagnosis present

## 2022-10-08 DIAGNOSIS — K860 Alcohol-induced chronic pancreatitis: Secondary | ICD-10-CM | POA: Diagnosis present

## 2022-10-08 DIAGNOSIS — D75839 Thrombocytosis, unspecified: Secondary | ICD-10-CM | POA: Diagnosis present

## 2022-10-08 DIAGNOSIS — G894 Chronic pain syndrome: Secondary | ICD-10-CM | POA: Diagnosis not present

## 2022-10-08 DIAGNOSIS — I10 Essential (primary) hypertension: Secondary | ICD-10-CM | POA: Diagnosis not present

## 2022-10-08 DIAGNOSIS — F17210 Nicotine dependence, cigarettes, uncomplicated: Secondary | ICD-10-CM | POA: Diagnosis present

## 2022-10-08 DIAGNOSIS — E43 Unspecified severe protein-calorie malnutrition: Secondary | ICD-10-CM | POA: Diagnosis present

## 2022-10-08 DIAGNOSIS — F10939 Alcohol use, unspecified with withdrawal, unspecified: Secondary | ICD-10-CM | POA: Diagnosis present

## 2022-10-08 DIAGNOSIS — K2920 Alcoholic gastritis without bleeding: Secondary | ICD-10-CM | POA: Diagnosis present

## 2022-10-08 DIAGNOSIS — Z720 Tobacco use: Secondary | ICD-10-CM | POA: Diagnosis present

## 2022-10-08 DIAGNOSIS — K852 Alcohol induced acute pancreatitis without necrosis or infection: Principal | ICD-10-CM | POA: Diagnosis present

## 2022-10-08 DIAGNOSIS — R197 Diarrhea, unspecified: Secondary | ICD-10-CM

## 2022-10-08 DIAGNOSIS — K861 Other chronic pancreatitis: Secondary | ICD-10-CM

## 2022-10-08 DIAGNOSIS — G8929 Other chronic pain: Secondary | ICD-10-CM | POA: Diagnosis present

## 2022-10-08 DIAGNOSIS — K859 Acute pancreatitis without necrosis or infection, unspecified: Secondary | ICD-10-CM | POA: Diagnosis not present

## 2022-10-08 DIAGNOSIS — F1020 Alcohol dependence, uncomplicated: Secondary | ICD-10-CM | POA: Diagnosis present

## 2022-10-08 DIAGNOSIS — F10139 Alcohol abuse with withdrawal, unspecified: Secondary | ICD-10-CM | POA: Diagnosis present

## 2022-10-08 DIAGNOSIS — Z79899 Other long term (current) drug therapy: Secondary | ICD-10-CM | POA: Diagnosis not present

## 2022-10-08 DIAGNOSIS — R1084 Generalized abdominal pain: Secondary | ICD-10-CM | POA: Diagnosis present

## 2022-10-08 LAB — CBC WITH DIFFERENTIAL/PLATELET
HCT: 25.4 % — ABNORMAL LOW (ref 39.0–52.0)
Hemoglobin: 8.1 g/dL — ABNORMAL LOW (ref 13.0–17.0)
MCH: 28.2 pg (ref 26.0–34.0)
MCHC: 31.9 g/dL (ref 30.0–36.0)
MCV: 88.5 fL (ref 80.0–100.0)
Platelets: 488 10*3/uL — ABNORMAL HIGH (ref 150–400)
RBC: 2.87 MIL/uL — ABNORMAL LOW (ref 4.22–5.81)
RDW: 22.5 % — ABNORMAL HIGH (ref 11.5–15.5)
WBC: 4.2 10*3/uL (ref 4.0–10.5)
nRBC: 0 % (ref 0.0–0.2)

## 2022-10-08 LAB — URINALYSIS, ROUTINE W REFLEX MICROSCOPIC
Bilirubin Urine: NEGATIVE
Glucose, UA: NEGATIVE mg/dL
Hgb urine dipstick: NEGATIVE
Ketones, ur: NEGATIVE mg/dL
Nitrite: NEGATIVE
Protein, ur: 30 mg/dL — AB
Specific Gravity, Urine: 1.027 (ref 1.005–1.030)
pH: 5 (ref 5.0–8.0)

## 2022-10-08 LAB — CBC
HCT: 28.2 % — ABNORMAL LOW (ref 39.0–52.0)
Hemoglobin: 8.6 g/dL — ABNORMAL LOW (ref 13.0–17.0)
MCH: 27.1 pg (ref 26.0–34.0)
MCHC: 30.5 g/dL (ref 30.0–36.0)
MCV: 89 fL (ref 80.0–100.0)
Platelets: 507 10*3/uL — ABNORMAL HIGH (ref 150–400)
RBC: 3.17 MIL/uL — ABNORMAL LOW (ref 4.22–5.81)
RDW: 22.8 % — ABNORMAL HIGH (ref 11.5–15.5)
WBC: 5 10*3/uL (ref 4.0–10.5)
nRBC: 0 % (ref 0.0–0.2)

## 2022-10-08 LAB — COMPREHENSIVE METABOLIC PANEL
ALT: 15 U/L (ref 0–44)
AST: 36 U/L (ref 15–41)
Albumin: 2.7 g/dL — ABNORMAL LOW (ref 3.5–5.0)
Alkaline Phosphatase: 100 U/L (ref 38–126)
Anion gap: 14 (ref 5–15)
BUN: <5 mg/dL — ABNORMAL LOW (ref 6–20)
CO2: 20 mmol/L — ABNORMAL LOW (ref 22–32)
Calcium: 8.9 mg/dL (ref 8.9–10.3)
Chloride: 101 mmol/L (ref 98–111)
Creatinine, Ser: 0.64 mg/dL (ref 0.61–1.24)
GFR, Estimated: >60 mL/min (ref >60)
Glucose, Bld: 102 mg/dL — ABNORMAL HIGH (ref 70–99)
Potassium: 3.3 mmol/L — ABNORMAL LOW (ref 3.5–5.1)
Sodium: 135 mmol/L (ref 135–145)
Total Bilirubin: 0.3 mg/dL (ref 0.3–1.2)
Total Protein: 8.3 g/dL — ABNORMAL HIGH (ref 6.5–8.1)

## 2022-10-08 LAB — TROPONIN I (HIGH SENSITIVITY): Troponin I (High Sensitivity): 7 ng/L (ref <18)

## 2022-10-08 LAB — LIPASE, BLOOD: Lipase: 210 U/L — ABNORMAL HIGH (ref 11–51)

## 2022-10-08 LAB — I-STAT CG4 LACTIC ACID, ED: Lactic Acid, Venous: 1.5 mmol/L (ref 0.5–1.9)

## 2022-10-08 MED ORDER — ONDANSETRON HCL 4 MG/2ML IJ SOLN
4.0000 mg | Freq: Once | INTRAMUSCULAR | Status: AC
  Administered 2022-10-08: 4 mg via INTRAVENOUS
  Filled 2022-10-08: qty 2

## 2022-10-08 MED ORDER — ADULT MULTIVITAMIN W/MINERALS CH
1.0000 | ORAL_TABLET | Freq: Every day | ORAL | Status: DC
  Administered 2022-10-09 – 2022-10-12 (×4): 1 via ORAL
  Filled 2022-10-08 (×4): qty 1

## 2022-10-08 MED ORDER — LORAZEPAM 2 MG/ML IJ SOLN
1.0000 mg | INTRAMUSCULAR | Status: AC | PRN
  Administered 2022-10-10: 2 mg via INTRAVENOUS
  Filled 2022-10-08: qty 1

## 2022-10-08 MED ORDER — ONDANSETRON HCL 4 MG PO TABS: 4.0000 mg | ORAL_TABLET | Freq: Four times a day (QID) | ORAL | Status: DC | PRN

## 2022-10-08 MED ORDER — SODIUM CHLORIDE 0.9 % IV BOLUS
1000.0000 mL | Freq: Once | INTRAVENOUS | Status: AC
  Administered 2022-10-08: 1000 mL via INTRAVENOUS

## 2022-10-08 MED ORDER — MORPHINE SULFATE (PF) 4 MG/ML IV SOLN
4.0000 mg | Freq: Once | INTRAVENOUS | Status: AC
  Administered 2022-10-08: 4 mg via INTRAVENOUS
  Filled 2022-10-08: qty 1

## 2022-10-08 MED ORDER — ALUM & MAG HYDROXIDE-SIMETH 200-200-20 MG/5ML PO SUSP
30.0000 mL | Freq: Once | ORAL | Status: AC
  Administered 2022-10-08: 30 mL via ORAL
  Filled 2022-10-08: qty 30

## 2022-10-08 MED ORDER — THIAMINE MONONITRATE 100 MG PO TABS: 100.0000 mg | ORAL_TABLET | Freq: Every day | ORAL | Status: DC

## 2022-10-08 MED ORDER — HYOSCYAMINE SULFATE 0.125 MG SL SUBL
0.2500 mg | SUBLINGUAL_TABLET | Freq: Once | SUBLINGUAL | Status: AC
  Administered 2022-10-08: 0.25 mg via SUBLINGUAL
  Filled 2022-10-08: qty 2

## 2022-10-08 MED ORDER — HEPARIN SODIUM (PORCINE) 5000 UNIT/ML IJ SOLN
5000.0000 [IU] | Freq: Three times a day (TID) | INTRAMUSCULAR | Status: DC
  Administered 2022-10-09 – 2022-10-12 (×9): 5000 [IU] via SUBCUTANEOUS
  Filled 2022-10-08 (×9): qty 1

## 2022-10-08 MED ORDER — ACETAMINOPHEN 650 MG RE SUPP: 650.0000 mg | Freq: Four times a day (QID) | RECTAL | Status: DC | PRN

## 2022-10-08 MED ORDER — FOLIC ACID 1 MG PO TABS
1.0000 mg | ORAL_TABLET | Freq: Every day | ORAL | Status: DC
  Administered 2022-10-09 – 2022-10-12 (×4): 1 mg via ORAL
  Filled 2022-10-08 (×4): qty 1

## 2022-10-08 MED ORDER — ACETAMINOPHEN 325 MG PO TABS: 650.0000 mg | ORAL_TABLET | Freq: Four times a day (QID) | ORAL | Status: DC | PRN

## 2022-10-08 MED ORDER — HYDROMORPHONE HCL 1 MG/ML IJ SOLN
1.0000 mg | Freq: Once | INTRAMUSCULAR | Status: AC
  Administered 2022-10-08: 1 mg via INTRAVENOUS
  Filled 2022-10-08: qty 1

## 2022-10-08 MED ORDER — PANTOPRAZOLE SODIUM 40 MG IV SOLR
40.0000 mg | Freq: Two times a day (BID) | INTRAVENOUS | Status: DC
  Administered 2022-10-08 – 2022-10-12 (×7): 40 mg via INTRAVENOUS
  Filled 2022-10-08 (×8): qty 10

## 2022-10-08 MED ORDER — THIAMINE HCL 100 MG/ML IJ SOLN: 100.0000 mg | Freq: Every day | INTRAMUSCULAR | Status: DC

## 2022-10-08 MED ORDER — HYDROMORPHONE HCL 1 MG/ML IJ SOLN
0.5000 mg | INTRAMUSCULAR | Status: DC | PRN
  Administered 2022-10-09 – 2022-10-12 (×18): 0.5 mg via INTRAVENOUS
  Filled 2022-10-08 (×18): qty 0.5

## 2022-10-08 MED ORDER — ONDANSETRON HCL 4 MG/2ML IJ SOLN: 4.0000 mg | Freq: Four times a day (QID) | INTRAMUSCULAR | Status: DC | PRN

## 2022-10-08 MED ORDER — DICYCLOMINE HCL 10 MG/5ML PO SOLN
10.0000 mg | Freq: Once | ORAL | Status: AC
  Administered 2022-10-09: 10 mg via ORAL
  Filled 2022-10-08: qty 5

## 2022-10-08 MED ORDER — LORAZEPAM 1 MG PO TABS: 1.0000 mg | ORAL_TABLET | ORAL | Status: AC | PRN

## 2022-10-08 MED ORDER — POTASSIUM CHLORIDE IN NACL 20-0.9 MEQ/L-% IV SOLN
INTRAVENOUS | Status: DC
  Filled 2022-10-08 (×10): qty 1000

## 2022-10-08 MED ORDER — IOHEXOL 350 MG/ML SOLN
75.0000 mL | Freq: Once | INTRAVENOUS | Status: AC | PRN
  Administered 2022-10-08: 75 mL via INTRAVENOUS

## 2022-10-08 MED ORDER — NICOTINE 14 MG/24HR TD PT24
14.0000 mg | MEDICATED_PATCH | Freq: Every day | TRANSDERMAL | Status: DC
  Administered 2022-10-09 – 2022-10-12 (×4): 14 mg via TRANSDERMAL
  Filled 2022-10-08 (×4): qty 1

## 2022-10-08 NOTE — ED Triage Notes (Signed)
Abdominal pain that started yesterday with nausea, vomiting. Pt has not been taking his home meds recently due to abdominal pain. Homeless.

## 2022-10-08 NOTE — ED Notes (Signed)
ED TO INPATIENT HANDOFF REPORT  ED Nurse Name and Phone #: [phone]  S Name/Age/Gender Samuel Blevins 57 y.o. male Room/Bed: 037C/037C  Code Status   Code Status: Full Code  Home/SNF/Other Homeless Patient oriented to: self, place, time, and situation Is this baseline? Yes   Triage Complete: Triage complete  Chief Complaint Acute on chronic pancreatitis (HCC) [K85.90, K86.1]  Triage Note Abdominal pain that started yesterday with nausea, vomiting. Pt has not been taking his home meds recently due to abdominal pain. Homeless.    Allergies No Known Allergies  Level of Care/Admitting Diagnosis ED Disposition     ED Disposition  Admit   Condition  --   Comment  Hospital Area: [facility] [[id number]]  Level of Care: Telemetry Medical [[id number]]  May admit patient to [facility] or [facility] if equivalent level of care is available:: Yes  Covid Evaluation: Confirmed COVID Negative  Diagnosis: Acute on chronic pancreatitis [facility]) [[id number]]  Admitting Physician: Gery Pray [[id number]]  Attending Physician: Gery Pray [[id number]]  Certification:: I certify this patient will need inpatient services for at least 2 midnights  Expected Medical Readiness: 10/09/2022          B Medical/Surgery History Past Medical History:  Diagnosis Date   Alcohol withdrawal syndrome with complication (HCC)    Alcoholism (HCC)    Elevated AST (SGOT)    Gastrointestinal hemorrhage    Homeless    Hypertension    Pancreatic insufficiency    takes Creon   Symptomatic anemia 11/23/2015   Thrombocytopenia (HCC) 05/09/2021   Past Surgical History:  Procedure Laterality Date   BIOPSY  06/23/2019   Procedure: BIOPSY;  Surgeon: Shellia Cleverly, DO;  Location: [facility] ENDOSCOPY;  Service: Gastroenterology;;   BIOPSY  12/12/2020   Procedure: BIOPSY;  Surgeon: Lynann Bologna, MD;  Location: [facility] ENDOSCOPY;  Service: Endoscopy;;   BIOPSY  08/04/2021   Procedure: BIOPSY;  Surgeon:  Jenel Lucks, MD;  Location: [facility] ENDOSCOPY;  Service: Gastroenterology;;   ESOPHAGOGASTRODUODENOSCOPY N/A 08/04/2021   Procedure: ESOPHAGOGASTRODUODENOSCOPY (EGD);  Surgeon: Jenel Lucks, MD;  Location: [facility] ENDOSCOPY;  Service: Gastroenterology;  Laterality: N/A;   ESOPHAGOGASTRODUODENOSCOPY (EGD) WITH PROPOFOL N/A 06/23/2019   Procedure: ESOPHAGOGASTRODUODENOSCOPY (EGD) WITH PROPOFOL;  Surgeon: Shellia Cleverly, DO;  Location: [facility] ENDOSCOPY;  Service: Gastroenterology;  Laterality: N/A;   ESOPHAGOGASTRODUODENOSCOPY (EGD) WITH PROPOFOL N/A 12/12/2020   Procedure: ESOPHAGOGASTRODUODENOSCOPY (EGD) WITH PROPOFOL;  Surgeon: Lynann Bologna, MD;  Location: [facility] ENDOSCOPY;  Service: Endoscopy;  Laterality: N/A;   HOT HEMOSTASIS N/A 06/23/2019   Procedure: HOT HEMOSTASIS (ARGON PLASMA COAGULATION/BICAP);  Surgeon: Shellia Cleverly, DO;  Location: [facility] ENDOSCOPY;  Service: Gastroenterology;  Laterality: N/A;   IR PARACENTESIS  08/21/2022   IR PARACENTESIS  08/26/2022   IR PARACENTESIS  08/30/2022   IR PARACENTESIS  09/02/2022   IR PARACENTESIS  09/06/2022     A IV Location/Drains/Wounds Patient Lines/Drains/Airways Status     Active Line/Drains/Airways     Name Placement date Placement time Site Days   Peripheral IV 10/08/22 20 G Left Antecubital 10/08/22  2015  Antecubital  less than 1            Intake/Output Last 24 hours No intake or output data in the 24 hours ending 10/08/22 2320  Labs/Imaging Results for orders placed or performed during the hospital encounter of 10/08/22 (from the past 48 hour(s))  CBC     Status: Abnormal   Collection Time: 10/08/22  4:45 PM  Result Value  Ref Range   WBC 5.0 4.0 - 10.5 K/uL   RBC 3.17 (L) 4.22 - 5.81 MIL/uL   Hemoglobin 8.6 (L) 13.0 - 17.0 g/dL   HCT 32.4 (L) 40.1 - 02.7 %   MCV 89.0 80.0 - 100.0 fL   MCH 27.1 26.0 - 34.0 pg   MCHC 30.5 30.0 - 36.0 g/dL   RDW 25.3 (H) 66.4 - 40.3 %   Platelets 507 (H) 150 - 400 K/uL   nRBC 0.0 0.0 - 0.2  %    Comment: Performed at [facility], [address] [place], [place] [address]  Comprehensive metabolic panel     Status: Abnormal   Collection Time: 10/08/22  4:45 PM  Result Value Ref Range   Sodium 135 135 - 145 mmol/L   Potassium 3.3 (L) 3.5 - 5.1 mmol/L   Chloride 101 98 - 111 mmol/L   CO2 20 (L) 22 - 32 mmol/L   Glucose, Bld 102 (H) 70 - 99 mg/dL    Comment: Glucose reference range applies only to samples taken after fasting for at least 8 hours.   BUN <5 (L) 6 - 20 mg/dL   Creatinine, Ser 9.56 0.61 - 1.24 mg/dL   Calcium 8.9 8.9 - 38.7 mg/dL   Total Protein 8.3 (H) 6.5 - 8.1 g/dL   Albumin 2.7 (L) 3.5 - 5.0 g/dL   AST 36 15 - 41 U/L   ALT 15 0 - 44 U/L   Alkaline Phosphatase 100 38 - 126 U/L   Total Bilirubin 0.3 0.3 - 1.2 mg/dL   GFR, Estimated >56 >43 mL/min    Comment: (NOTE) Calculated using the CKD-EPI Creatinine Equation (2021)    Anion gap 14 5 - 15    Comment: Performed at [facility], [address], [place], [place] [address]  Lipase, blood     Status: Abnormal   Collection Time: 10/08/22  4:45 PM  Result Value Ref Range   Lipase 210 (H) 11 - 51 U/L    Comment: Performed at [facility], [address], [place], [place] [address]  Troponin I (High Sensitivity)     Status: None   Collection Time: 10/08/22  9:14 PM  Result Value Ref Range   Troponin I (High Sensitivity) 7 <18 ng/L    Comment: (NOTE) Elevated high sensitivity troponin I (hsTnI) values and significant  changes across serial measurements may suggest ACS but many other  chronic and acute conditions are known to elevate hsTnI results.  Refer to the "Links" section for chest pain algorithms and additional  guidance. Performed at [facility], [address] [place], [place] [address]   Urinalysis, Routine w reflex microscopic -Urine, Clean Catch     Status: Abnormal   Collection Time: 10/08/22  9:14 PM  Result Value Ref Range   Color, Urine YELLOW YELLOW    APPearance HAZY (A) CLEAR   Specific Gravity, Urine 1.027 1.005 - 1.030   pH 5.0 5.0 - 8.0   Glucose, UA NEGATIVE NEGATIVE mg/dL   Hgb urine dipstick NEGATIVE NEGATIVE   Bilirubin Urine NEGATIVE NEGATIVE   Ketones, ur NEGATIVE NEGATIVE mg/dL   Protein, ur 30 (A) NEGATIVE mg/dL   Nitrite NEGATIVE NEGATIVE   Leukocytes,Ua LARGE (A) NEGATIVE   RBC / HPF 6-10 0 - 5 RBC/hpf   WBC, UA 21-50 0 - 5 WBC/hpf   Bacteria, UA FEW (A) NONE SEEN   Squamous Epithelial / HPF 21-50 0 - 5 /  HPF   Mucus PRESENT    Hyaline Casts, UA PRESENT    Non Squamous Epithelial 0-5 (A) NONE SEEN    Comment: Performed at [facility], [address], [place], [place] [address]  I-Stat CG4 Lactic Acid     Status: None   Collection Time: 10/08/22  9:34 PM  Result Value Ref Range   Lactic Acid, Venous 1.5 0.5 - 1.9 mmol/L   CT ABDOMEN PELVIS W CONTRAST  Result Date: 10/08/2022 CLINICAL DATA:  Abdominal pain EXAM: CT ABDOMEN AND PELVIS WITH CONTRAST TECHNIQUE: Multidetector CT imaging of the abdomen and pelvis was performed using the standard protocol following bolus administration of intravenous contrast. RADIATION DOSE REDUCTION: This exam was performed according to the departmental dose-optimization program which includes automated exposure control, adjustment of the mA and/or kV according to patient size and/or use of iterative reconstruction technique. CONTRAST:  75mL OMNIPAQUE IOHEXOL 350 MG/ML SOLN COMPARISON:  09/25/2022 FINDINGS: Lower chest: Mild dependent atelectasis. Hepatobiliary: Liver is within normal limits. Layering small gallstones in the gallbladder fundus (series 3/image 33), without associated inflammatory changes. Pancreas: Parenchymal calcifications in the pancreatic head/uncinate process, reflecting sequela of prior/chronic pancreatitis. Indwelling pancreatic duct stent. Prior large pseudocyst along the posterior aspect of the stomach is significantly improved, now with a residual 2.9 x 6.2 cm fluid  and gas collection (series 3/image 19), predominantly gas. Spleen: Within normal limits. Adrenals/Urinary Tract: Adrenal glands are within normal limits. Kidneys are within normal limits.  No hydronephrosis. Bladder is unremarkable. Stomach/Bowel: Stomach is within normal limits. No evidence of bowel obstruction. Appendix is not discretely visualized. No colonic wall thickening or inflammatory changes. Vascular/Lymphatic: No evidence of abdominal aortic aneurysm. Atherosclerotic calcifications of the abdominal aorta and branch vessels, although vessels remain patent. No suspicious abdominopelvic lymphadenopathy. Reproductive: Enlargement of the central gland of the prostate which indents the base of the bladder, suggesting BPH. Other: Trace perihepatic fluid. Associated 2.9 x 1.8 cm walled-off fluid collection inferior to the right liver (series 3/image 37). Prior pigtail surgical drain has been removed. Additional left mid abdominal pigtail surgical drain has been removed, without residual fluid collection. Musculoskeletal: Degenerative changes of the lower lumbar spine. IMPRESSION: Near complete resolution of prior large pseudocyst posterior to the stomach. Mild residual fluid/gas persists, as above. Interval removal of left mid abdominal and right lateral abdominal surgical drains. New/residual 2.9 cm fluid collection inferior to the right liver. Sequela of prior/chronic pancreatitis with indwelling pancreatic ductal stent. Cholelithiasis, without associated inflammatory changes. Electronically Signed   By: Charline Bills M.D.   On: 10/08/2022 21:23    Pending Labs Unresulted Labs (From admission, onward)     Start     Ordered   10/09/22 0500  Comprehensive metabolic panel  Tomorrow morning,   R        10/08/22 2320   10/09/22 0500  CBC with Differential/Platelet  Tomorrow morning,   R        10/08/22 2320   10/09/22 0500  Protime-INR  Tomorrow morning,   R        10/08/22 2320   10/09/22 0500   Magnesium  Tomorrow morning,   R        10/08/22 2320   10/09/22 0500  Phosphorus  Tomorrow morning,   R        10/08/22 2320   10/09/22 0500  Ammonia  Tomorrow morning,   R        10/08/22 2320   10/08/22 2319  HIV Antibody (routine  testing w rflx)  (HIV Antibody (Routine testing w reflex) panel)  Once,   R        10/08/22 2320   10/08/22 2319  CBC  (heparin)  Once,   R       Comments: Baseline for heparin therapy IF NOT ALREADY DRAWN.  Notify MD if PLT < 100 K.    10/08/22 2320   10/08/22 2319  Creatinine, serum  (heparin)  Once,   R       Comments: Baseline for heparin therapy IF NOT ALREADY DRAWN.    10/08/22 2320   10/08/22 2213  Urine Culture  Once,   URGENT       Question:  Indication  Answer:  Dysuria   10/08/22 2212            Vitals/Pain Today's Vitals   10/08/22 1922 10/08/22 2105 10/08/22 2117 10/08/22 2211  BP: 136/77     Pulse: (!) 103     Resp: 16     Temp: 98.5 F (36.9 C)     TempSrc: Oral     SpO2: 100%     Weight:      Height:      PainSc:  9  8  5      Isolation Precautions No active isolations  Medications Medications  dicyclomine (BENTYL) 10 MG/5ML solution 10 mg (has no administration in time range)  pantoprazole (PROTONIX) injection 40 mg (40 mg Intravenous Given 10/08/22 2309)  heparin injection 5,000 Units (has no administration in time range)  acetaminophen (TYLENOL) tablet 650 mg (has no administration in time range)    Or  acetaminophen (TYLENOL) suppository 650 mg (has no administration in time range)  ondansetron (ZOFRAN) tablet 4 mg (has no administration in time range)    Or  ondansetron (ZOFRAN) injection 4 mg (has no administration in time range)  HYDROmorphone (DILAUDID) injection 0.5 mg (has no administration in time range)  sodium chloride 0.9 % bolus 1,000 mL (0 mLs Intravenous Stopped 10/08/22 2119)  ondansetron (ZOFRAN) injection 4 mg (4 mg Intravenous Given 10/08/22 2020)  morphine (PF) 4 MG/ML injection 4 mg (4 mg  Intravenous Given 10/08/22 2020)  iohexol (OMNIPAQUE) 350 MG/ML injection 75 mL (75 mLs Intravenous Contrast Given 10/08/22 2112)  HYDROmorphone (DILAUDID) injection 1 mg (1 mg Intravenous Given 10/08/22 2151)  alum & mag hydroxide-simeth (MAALOX/MYLANTA) 200-200-20 MG/5ML suspension 30 mL (30 mLs Oral Given 10/08/22 2309)  hyoscyamine (LEVSIN SL) SL tablet 0.25 mg (0.25 mg Sublingual Given 10/08/22 2313)    Mobility walks with device     Focused Assessments GI Assessment   Acute pancreatitis, unspecified complication status, unspecified pancreatitis type   R Recommendations: See Admitting Provider Note  Report given to:   Additional Notes:

## 2022-10-08 NOTE — H&P (Signed)
PCP:   [name], MD   Chief Complaint:  Abdominal pain  HPI: This is a 57 year old male with past medical history of alcohol abuse, recurrent pancreatitis, recurrent ascites, recent biliary stent placements, HTN.  He was recently admitted here 7/24 to 8/7.  At that time he was admitted for SBP, recurrent VRE positive ascites (amyloid positive) and ongoing abdominal pain.  Was concerned of pancreatic duct disruption.  The patient was transferred to 7/23 and 8/6 to stent pancreatic duct disruption.  Patient also was positive for C. difficile and had a Proteus species positive abdominal abscess.  During that hospitalization patient had 5 separate paracentesis done.  Postprocedure patient's CT abdomen showed pneumoperitoneum as well as several diffuse loculated regions in the abdomen concerning for abscess.  Dr. Corliss Parish GI at [facility] confirmed that pneumoperitoneum is expected given their approach through the gastric wall done to place a stent.  CT done 24/25 showed.  A positive abscess that was drained by IR.Marland Kitchen  Treated inpatient with IV antibiotics.  Patient was discharged on prophylactic vancomycin  Since discharge the patient reports continued generalized abdominal pain as well as shortness of breath.  Per patient it hurts when he inhales.  He denies wheezing.  He denies fevers but endorses mild chills.  He states he has daily diarrhea sometimes several times a day.  He denies blood in stool.  He states he had nausea once today, this occurs on and off.  He his last drink was Monday.  It was vodka.  He represents to the ER due to abdominal pain.  In the ER patient's afebrile.,  138/92, 106, 96, 18, satting 96% on RA.  Hemoglobin 8.6, platelets 507, lipase 210.  CT abdomen pelvis shows Near complete resolution of prior large pseudocyst posterior to the stomach. Mild residual fluid/gas persists, as above. Interval removal of left mid abdominal and right lateral abdominal surgical drains.  New/residual 2.9 cm fluid collection inferior to the right liver. Sequela of prior/chronic pancreatitis with indwelling pancreatic ductal stent  Review of Systems:  Per HPI  Past Medical History: Past Medical History:  Diagnosis Date   Alcohol withdrawal syndrome with complication (HCC)    Alcoholism (HCC)    Elevated AST (SGOT)    Gastrointestinal hemorrhage    Homeless    Hypertension    Pancreatic insufficiency    takes Creon   Symptomatic anemia 11/23/2015   Thrombocytopenia (HCC) 05/09/2021   Past Surgical History:  Procedure Laterality Date   BIOPSY  06/23/2019   Procedure: BIOPSY;  Surgeon: Shellia Cleverly, DO;  Location: [facility] ENDOSCOPY;  Service: Gastroenterology;;   BIOPSY  12/12/2020   Procedure: BIOPSY;  Surgeon: Lynann Bologna, MD;  Location: [facility] ENDOSCOPY;  Service: Endoscopy;;   BIOPSY  08/04/2021   Procedure: BIOPSY;  Surgeon: Jenel Lucks, MD;  Location: [facility] ENDOSCOPY;  Service: Gastroenterology;;   ESOPHAGOGASTRODUODENOSCOPY N/A 08/04/2021   Procedure: ESOPHAGOGASTRODUODENOSCOPY (EGD);  Surgeon: Jenel Lucks, MD;  Location: [facility] ENDOSCOPY;  Service: Gastroenterology;  Laterality: N/A;   ESOPHAGOGASTRODUODENOSCOPY (EGD) WITH PROPOFOL N/A 06/23/2019   Procedure: ESOPHAGOGASTRODUODENOSCOPY (EGD) WITH PROPOFOL;  Surgeon: Shellia Cleverly, DO;  Location: [facility] ENDOSCOPY;  Service: Gastroenterology;  Laterality: N/A;   ESOPHAGOGASTRODUODENOSCOPY (EGD) WITH PROPOFOL N/A 12/12/2020   Procedure: ESOPHAGOGASTRODUODENOSCOPY (EGD) WITH PROPOFOL;  Surgeon: Lynann Bologna, MD;  Location: [facility] ENDOSCOPY;  Service: Endoscopy;  Laterality: N/A;   HOT HEMOSTASIS N/A 06/23/2019   Procedure: HOT HEMOSTASIS (ARGON PLASMA COAGULATION/BICAP);  Surgeon: Shellia Cleverly, DO;  Location: [facility]  ENDOSCOPY;  Service: Gastroenterology;  Laterality: N/A;   IR PARACENTESIS  08/21/2022   IR PARACENTESIS  08/26/2022   IR PARACENTESIS  08/30/2022   IR PARACENTESIS  09/02/2022   IR PARACENTESIS   09/06/2022    Medications: Prior to Admission medications   Medication Sig Start Date End Date Taking? Authorizing Provider  carvedilol (COREG) 3.125 MG tablet Take 1 tablet (3.125 mg total) by mouth 2 (two) times daily with a meal. 10/02/22   Hughie Closs, MD  folic acid (FOLVITE) 1 MG tablet Take 1 tablet (1 mg total) by mouth daily. 10/02/22   Hughie Closs, MD  hydrOXYzine (ATARAX) 25 MG tablet Take 1 tablet (25 mg total) by mouth at bedtime as needed for anxiety. 10/02/22   Hughie Closs, MD  Iron, Ferrous Sulfate, 325 (65 Fe) MG TABS Take 1 tablet ( 325 mg) by mouth daily. 10/02/22   Hughie Closs, MD  lipase/protease/amylase (CREON) 12000-38000 units CPEP capsule Take 1 capsule (12,000 Units total) by mouth 3 (three) times daily before meals. 10/02/22 12/31/22  Hughie Closs, MD  magnesium oxide (MAG-OX) 400 MG tablet Take 1 tablet (400 mg total) by mouth in the morning and at bedtime. 10/02/22   Hughie Closs, MD  Multiple Vitamin (MULTIVITAMIN WITH MINERALS) TABS tablet Take 1 tablet by mouth daily. Patient not taking: Reported on 10/03/2022 07/14/22   Rolly Salter, MD  nicotine (NICODERM CQ - DOSED IN MG/24 HOURS) 14 mg/24hr patch Place 1 patch (14 mg total) onto the skin daily. Patient not taking: Reported on 10/03/2022 10/02/22   Hughie Closs, MD  ondansetron (ZOFRAN) 8 MG tablet Take 1 tablet (8 mg total) by mouth every 8 (eight) hours as needed for nausea or vomiting. 10/03/22   [name], MD  pantoprazole (PROTONIX) 40 MG tablet Take 1 tablet (40 mg total) by mouth 2 (two) times daily before a meal. 10/02/22   Hughie Closs, MD  polyethylene glycol (MIRALAX / GLYCOLAX) 17 g packet Take 17 g by mouth daily as needed for mild constipation or moderate constipation. Patient not taking: Reported on 10/03/2022 07/14/22   Rolly Salter, MD  sucralfate (CARAFATE) 1 g tablet Take 1 tablet by mouth 4 times daily with meals and bedtime 10/02/22   Hughie Closs, MD  thiamine (VITAMIN B1) 100 MG tablet Take  1 tablet (100 mg total) by mouth daily. 10/02/22   Hughie Closs, MD  traMADol (ULTRAM) 50 MG tablet Take 1 tablet (50 mg total) by mouth every 8 (eight) hours as needed for up to 5 days. 10/03/22 10/13/22  [name], MD  vancomycin (VANCOCIN) 125 MG capsule Take 1 capsule (125 mg total) by mouth in the morning and at bedtime for 7 days. 10/02/22 10/09/22  Hughie Closs, MD    Allergies:  No Known Allergies  Social History:  reports that he has been smoking cigarettes. He has a 15 pack-year smoking history. He has never used smokeless tobacco. He reports current alcohol use of about 28.0 standard drinks of alcohol per week. He reports that he does not use drugs.  Family History: Family History  Problem Relation Age of Onset   Diabetes Mellitus II Neg Hx    Colon cancer Neg Hx    Stomach cancer Neg Hx    Pancreatic cancer Neg Hx     Physical Exam: Vitals:   10/08/22 1625 10/08/22 1626 10/08/22 1922  BP: (!) 138/92  136/77  Pulse: (!) 106  (!) 103  Resp: 18  16  Temp: 98 F (36.7 C)  98.5 F (36.9 C)  TempSrc: Oral  Oral  SpO2: 96%  100%  Weight:  68.4 kg   Height:  5\' 9"  (1.753 m)     General: Large and oriented x 3 disheveled male., well developed and nourished, in pain. Eyes: No scleral icterus ENT: Moist oral mucosa, neck supple, no thyromegaly Lungs: CTA E/L, no wheeze, no crackles, no use of accessory muscles Cardiovascular: Tachycardia, RRR, no regurgitation, no gallops, no murmurs. No carotid bruits, no JVD Abdomen: soft, positive BS, generalized TTP, no ascites. GU: not examined Neuro: CN II - XII grossly intact, sensation intact Musculoskeletal: strength 5/5 all extremities, no clubbing, cyanosis or edema Skin: no rash, no subcutaneous crepitation, no decubitus Psych: appropriate patient   Labs on Admission:  Recent Labs    10/08/22 1645  NA 135  K 3.3*  CL 101  CO2 20*  GLUCOSE 102*  BUN <5*  CREATININE 0.64  CALCIUM 8.9   Recent Labs     10/08/22 1645  AST 36  ALT 15  ALKPHOS 100  BILITOT 0.3  PROT 8.3*  ALBUMIN 2.7*   Recent Labs    10/08/22 1645  LIPASE 210*   Recent Labs    10/08/22 1645  WBC 5.0  HGB 8.6*  HCT 28.2*  MCV 89.0  PLT 507*    Radiological Exams on Admission: CT ABDOMEN PELVIS W CONTRAST  Result Date: 10/08/2022 CLINICAL DATA:  Abdominal pain EXAM: CT ABDOMEN AND PELVIS WITH CONTRAST TECHNIQUE: Multidetector CT imaging of the abdomen and pelvis was performed using the standard protocol following bolus administration of intravenous contrast. RADIATION DOSE REDUCTION: This exam was performed according to the departmental dose-optimization program which includes automated exposure control, adjustment of the mA and/or kV according to patient size and/or use of iterative reconstruction technique. CONTRAST:  75mL OMNIPAQUE IOHEXOL 350 MG/ML SOLN COMPARISON:  09/25/2022 FINDINGS: Lower chest: Mild dependent atelectasis. Hepatobiliary: Liver is within normal limits. Layering small gallstones in the gallbladder fundus (series 3/image 33), without associated inflammatory changes. Pancreas: Parenchymal calcifications in the pancreatic head/uncinate process, reflecting sequela of prior/chronic pancreatitis. Indwelling pancreatic duct stent. Prior large pseudocyst along the posterior aspect of the stomach is significantly improved, now with a residual 2.9 x 6.2 cm fluid and gas collection (series 3/image 19), predominantly gas. Spleen: Within normal limits. Adrenals/Urinary Tract: Adrenal glands are within normal limits. Kidneys are within normal limits.  No hydronephrosis. Bladder is unremarkable. Stomach/Bowel: Stomach is within normal limits. No evidence of bowel obstruction. Appendix is not discretely visualized. No colonic wall thickening or inflammatory changes. Vascular/Lymphatic: No evidence of abdominal aortic aneurysm. Atherosclerotic calcifications of the abdominal aorta and branch vessels, although vessels  remain patent. No suspicious abdominopelvic lymphadenopathy. Reproductive: Enlargement of the central gland of the prostate which indents the base of the bladder, suggesting BPH. Other: Trace perihepatic fluid. Associated 2.9 x 1.8 cm walled-off fluid collection inferior to the right liver (series 3/image 37). Prior pigtail surgical drain has been removed. Additional left mid abdominal pigtail surgical drain has been removed, without residual fluid collection. Musculoskeletal: Degenerative changes of the lower lumbar spine. IMPRESSION: Near complete resolution of prior large pseudocyst posterior to the stomach. Mild residual fluid/gas persists, as above. Interval removal of left mid abdominal and right lateral abdominal surgical drains. New/residual 2.9 cm fluid collection inferior to the right liver. Sequela of prior/chronic pancreatitis with indwelling pancreatic ductal stent. Cholelithiasis, without associated inflammatory changes. Electronically Signed   By: Roselie Awkward.D.  On: 10/08/2022 21:23    Assessment/Plan Present on Admission:  Abdominal pain  DDx includes acute on chronic pancreatitis, alcoholic gastritis, recurrent C. difficile infection, SBP, cholecystitis -Acute on chronic pancreatitis-will initiate treatment for this.  Patient continues to drink.  Can occur with normal lipase.  IV fluid initiated.  Patient is distended.  N.p.o. 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  Patient states he completed meds however this is questionable.  C. difficile, GI panel ordered -Alcoholic gastritis.  GI cocktail given in ER.  Protonix IV every 12 hours first dose now ordered -Recurrent VRE SBP.  Patient without ascites.  This is less likely.  However ESR, lactic acid has been ordered   Alcohol withdrawal (HCC)  Chronic alcohol abuse -CIWA protocol initiated   Chronic pain syndrome -   Essential hypertension -Normotensive   Protein-calorie malnutrition, severe -N.p.o. albumin level  2.7.  Albumin level ordered for a.m.   Tobacco abuse -Nicotine patch  Adarian Bur 10/08/2022, 10:26 PM

## 2022-10-08 NOTE — H&P (Incomplete Revision)
PCP:   [name], MD   Chief Complaint:  Abdominal pain  HPI: This is a 57 year old male with past medical history of alcohol abuse, recurrent pancreatitis, recurrent ascites, recent biliary stent placements, HTN.  He was recently admitted here 7/24 to 8/7.  At that time he was admitted for SBP, recurrent VRE positive ascites (amyloid positive) and ongoing abdominal pain.  Was concerned of pancreatic duct disruption.  The patient was transferred to 7/23 and 8/6 to stent pancreatic duct disruption.  Patient also was positive for C. difficile and had a Proteus species positive abdominal abscess.  During that hospitalization patient had 5 separate paracentesis done.  Postprocedure patient's CT abdomen showed pneumoperitoneum as well as several diffuse loculated regions in the abdomen concerning for abscess.  Dr. Corliss Parish GI at [facility] confirmed that pneumoperitoneum is expected given their approach through the gastric wall done to place a stent.  CT done 24/25 showed.  A positive abscess that was drained by IR.Marland Kitchen  Treated inpatient with IV antibiotics.  Patient was discharged on prophylactic vancomycin  Since discharge the patient reports continued generalized abdominal pain as well as shortness of breath.  Per patient it hurts when he inhales.  He denies wheezing.  He denies fevers but endorses mild chills.  He states he has daily diarrhea sometimes several times a day.  He denies blood in stool.  He states he had nausea once today, this occurs on and off.  He his last drink was Monday.  It was vodka.  He represents to the ER due to abdominal pain.  In the ER patient's afebrile.,  138/92, 106, 96, 18, satting 96% on RA.  Hemoglobin 8.6, platelets 507, lipase 210.  CT abdomen pelvis shows Near complete resolution of prior large pseudocyst posterior to the stomach. Mild residual fluid/gas persists, as above. Interval removal of left mid abdominal and right lateral abdominal surgical drains.  New/residual 2.9 cm fluid collection inferior to the right liver. Sequela of prior/chronic pancreatitis with indwelling pancreatic ductal stent  Review of Systems:  Per HPI  Past Medical History: Past Medical History:  Diagnosis Date   Alcohol withdrawal syndrome with complication (HCC)    Alcoholism (HCC)    Elevated AST (SGOT)    Gastrointestinal hemorrhage    Homeless    Hypertension    Pancreatic insufficiency    takes Creon   Symptomatic anemia 11/23/2015   Thrombocytopenia (HCC) 05/09/2021   Past Surgical History:  Procedure Laterality Date   BIOPSY  06/23/2019   Procedure: BIOPSY;  Surgeon: Shellia Cleverly, DO;  Location: [facility] ENDOSCOPY;  Service: Gastroenterology;;   BIOPSY  12/12/2020   Procedure: BIOPSY;  Surgeon: Lynann Bologna, MD;  Location: [facility] ENDOSCOPY;  Service: Endoscopy;;   BIOPSY  08/04/2021   Procedure: BIOPSY;  Surgeon: Jenel Lucks, MD;  Location: [facility] ENDOSCOPY;  Service: Gastroenterology;;   ESOPHAGOGASTRODUODENOSCOPY N/A 08/04/2021   Procedure: ESOPHAGOGASTRODUODENOSCOPY (EGD);  Surgeon: Jenel Lucks, MD;  Location: [facility] ENDOSCOPY;  Service: Gastroenterology;  Laterality: N/A;   ESOPHAGOGASTRODUODENOSCOPY (EGD) WITH PROPOFOL N/A 06/23/2019   Procedure: ESOPHAGOGASTRODUODENOSCOPY (EGD) WITH PROPOFOL;  Surgeon: Shellia Cleverly, DO;  Location: [facility] ENDOSCOPY;  Service: Gastroenterology;  Laterality: N/A;   ESOPHAGOGASTRODUODENOSCOPY (EGD) WITH PROPOFOL N/A 12/12/2020   Procedure: ESOPHAGOGASTRODUODENOSCOPY (EGD) WITH PROPOFOL;  Surgeon: Lynann Bologna, MD;  Location: [facility] ENDOSCOPY;  Service: Endoscopy;  Laterality: N/A;   HOT HEMOSTASIS N/A 06/23/2019   Procedure: HOT HEMOSTASIS (ARGON PLASMA COAGULATION/BICAP);  Surgeon: Shellia Cleverly, DO;  Location: [facility]  ENDOSCOPY;  Service: Gastroenterology;  Laterality: N/A;   IR PARACENTESIS  08/21/2022   IR PARACENTESIS  08/26/2022   IR PARACENTESIS  08/30/2022   IR PARACENTESIS  09/02/2022   IR PARACENTESIS   09/06/2022    Medications: Prior to Admission medications   Medication Sig Start Date End Date Taking? Authorizing Provider  carvedilol (COREG) 3.125 MG tablet Take 1 tablet (3.125 mg total) by mouth 2 (two) times daily with a meal. 10/02/22   Hughie Closs, MD  folic acid (FOLVITE) 1 MG tablet Take 1 tablet (1 mg total) by mouth daily. 10/02/22   Hughie Closs, MD  hydrOXYzine (ATARAX) 25 MG tablet Take 1 tablet (25 mg total) by mouth at bedtime as needed for anxiety. 10/02/22   Hughie Closs, MD  Iron, Ferrous Sulfate, 325 (65 Fe) MG TABS Take 1 tablet ( 325 mg) by mouth daily. 10/02/22   Hughie Closs, MD  lipase/protease/amylase (CREON) 12000-38000 units CPEP capsule Take 1 capsule (12,000 Units total) by mouth 3 (three) times daily before meals. 10/02/22 12/31/22  Hughie Closs, MD  magnesium oxide (MAG-OX) 400 MG tablet Take 1 tablet (400 mg total) by mouth in the morning and at bedtime. 10/02/22   Hughie Closs, MD  Multiple Vitamin (MULTIVITAMIN WITH MINERALS) TABS tablet Take 1 tablet by mouth daily. Patient not taking: Reported on 10/03/2022 07/14/22   Rolly Salter, MD  nicotine (NICODERM CQ - DOSED IN MG/24 HOURS) 14 mg/24hr patch Place 1 patch (14 mg total) onto the skin daily. Patient not taking: Reported on 10/03/2022 10/02/22   Hughie Closs, MD  ondansetron (ZOFRAN) 8 MG tablet Take 1 tablet (8 mg total) by mouth every 8 (eight) hours as needed for nausea or vomiting. 10/03/22   [name], MD  pantoprazole (PROTONIX) 40 MG tablet Take 1 tablet (40 mg total) by mouth 2 (two) times daily before a meal. 10/02/22   Hughie Closs, MD  polyethylene glycol (MIRALAX / GLYCOLAX) 17 g packet Take 17 g by mouth daily as needed for mild constipation or moderate constipation. Patient not taking: Reported on 10/03/2022 07/14/22   Rolly Salter, MD  sucralfate (CARAFATE) 1 g tablet Take 1 tablet by mouth 4 times daily with meals and bedtime 10/02/22   Hughie Closs, MD  thiamine (VITAMIN B1) 100 MG tablet Take  1 tablet (100 mg total) by mouth daily. 10/02/22   Hughie Closs, MD  traMADol (ULTRAM) 50 MG tablet Take 1 tablet (50 mg total) by mouth every 8 (eight) hours as needed for up to 5 days. 10/03/22 10/13/22  [name], MD  vancomycin (VANCOCIN) 125 MG capsule Take 1 capsule (125 mg total) by mouth in the morning and at bedtime for 7 days. 10/02/22 10/09/22  Hughie Closs, MD    Allergies:  No Known Allergies  Social History:  reports that he has been smoking cigarettes. He has a 15 pack-year smoking history. He has never used smokeless tobacco. He reports current alcohol use of about 28.0 standard drinks of alcohol per week. He reports that he does not use drugs.  Family History: Family History  Problem Relation Age of Onset   Diabetes Mellitus II Neg Hx    Colon cancer Neg Hx    Stomach cancer Neg Hx    Pancreatic cancer Neg Hx     Physical Exam: Vitals:   10/08/22 1625 10/08/22 1626 10/08/22 1922  BP: (!) 138/92  136/77  Pulse: (!) 106  (!) 103  Resp: 18  16  Temp: 98 F (36.7 C)  98.5 F (36.9 C)  TempSrc: Oral  Oral  SpO2: 96%  100%  Weight:  68.4 kg   Height:  5\' 9"  (1.753 m)     General: Large and oriented x 3 disheveled male., well developed and nourished, in pain. Eyes: No scleral icterus ENT: Moist oral mucosa, neck supple, no thyromegaly Lungs: CTA E/L, no wheeze, no crackles, no use of accessory muscles Cardiovascular: Tachycardia, RRR, no regurgitation, no gallops, no murmurs. No carotid bruits, no JVD Abdomen: soft, positive BS, generalized TTP, no ascites. GU: not examined Neuro: CN II - XII grossly intact, sensation intact Musculoskeletal: strength 5/5 all extremities, no clubbing, cyanosis or edema Skin: no rash, no subcutaneous crepitation, no decubitus Psych: appropriate patient   Labs on Admission:  Recent Labs    10/08/22 1645  NA 135  K 3.3*  CL 101  CO2 20*  GLUCOSE 102*  BUN <5*  CREATININE 0.64  CALCIUM 8.9   Recent Labs     10/08/22 1645  AST 36  ALT 15  ALKPHOS 100  BILITOT 0.3  PROT 8.3*  ALBUMIN 2.7*   Recent Labs    10/08/22 1645  LIPASE 210*   Recent Labs    10/08/22 1645  WBC 5.0  HGB 8.6*  HCT 28.2*  MCV 89.0  PLT 507*    Radiological Exams on Admission: CT ABDOMEN PELVIS W CONTRAST  Result Date: 10/08/2022 CLINICAL DATA:  Abdominal pain EXAM: CT ABDOMEN AND PELVIS WITH CONTRAST TECHNIQUE: Multidetector CT imaging of the abdomen and pelvis was performed using the standard protocol following bolus administration of intravenous contrast. RADIATION DOSE REDUCTION: This exam was performed according to the departmental dose-optimization program which includes automated exposure control, adjustment of the mA and/or kV according to patient size and/or use of iterative reconstruction technique. CONTRAST:  75mL OMNIPAQUE IOHEXOL 350 MG/ML SOLN COMPARISON:  09/25/2022 FINDINGS: Lower chest: Mild dependent atelectasis. Hepatobiliary: Liver is within normal limits. Layering small gallstones in the gallbladder fundus (series 3/image 33), without associated inflammatory changes. Pancreas: Parenchymal calcifications in the pancreatic head/uncinate process, reflecting sequela of prior/chronic pancreatitis. Indwelling pancreatic duct stent. Prior large pseudocyst along the posterior aspect of the stomach is significantly improved, now with a residual 2.9 x 6.2 cm fluid and gas collection (series 3/image 19), predominantly gas. Spleen: Within normal limits. Adrenals/Urinary Tract: Adrenal glands are within normal limits. Kidneys are within normal limits.  No hydronephrosis. Bladder is unremarkable. Stomach/Bowel: Stomach is within normal limits. No evidence of bowel obstruction. Appendix is not discretely visualized. No colonic wall thickening or inflammatory changes. Vascular/Lymphatic: No evidence of abdominal aortic aneurysm. Atherosclerotic calcifications of the abdominal aorta and branch vessels, although vessels  remain patent. No suspicious abdominopelvic lymphadenopathy. Reproductive: Enlargement of the central gland of the prostate which indents the base of the bladder, suggesting BPH. Other: Trace perihepatic fluid. Associated 2.9 x 1.8 cm walled-off fluid collection inferior to the right liver (series 3/image 37). Prior pigtail surgical drain has been removed. Additional left mid abdominal pigtail surgical drain has been removed, without residual fluid collection. Musculoskeletal: Degenerative changes of the lower lumbar spine. IMPRESSION: Near complete resolution of prior large pseudocyst posterior to the stomach. Mild residual fluid/gas persists, as above. Interval removal of left mid abdominal and right lateral abdominal surgical drains. New/residual 2.9 cm fluid collection inferior to the right liver. Sequela of prior/chronic pancreatitis with indwelling pancreatic ductal stent. Cholelithiasis, without associated inflammatory changes. Electronically Signed   By: Roselie Awkward.D.  On: 10/08/2022 21:23    Assessment/Plan Present on Admission:  Abdominal pain  DDx includes acute on chronic pancreatitis, alcoholic gastritis, recurrent C. difficile infection, SBP, cholecystitis -Acute on chronic pancreatitis-will initiate treatment for this.  Patient continues to drink.  Can occur with normal lipase.  IV fluid initiated.  Patient is distended.  N.p.o. 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  Patient states he completed meds however this is questionable.  C. difficile, GI panel ordered -Alcoholic gastritis.  GI cocktail given in ER.  Protonix IV every 12 hours first dose now ordered -Recurrent VRE SBP.  Patient without ascites.  This is less likely.  However ESR, lactic acid has been ordered   Alcohol withdrawal (HCC)  Chronic alcohol abuse -CIWA protocol initiated   Hypokalemia -IV potassium repletion and IV fluids.   Essential hypertension -Normotensive   Protein-calorie malnutrition,  severe -N.p.o. albumin level 2.7.  Albumin level ordered for a.m.   Tobacco abuse -Nicotine patch  Michon Kaczmarek 10/08/2022, 10:26 PM

## 2022-10-08 NOTE — ED Provider Notes (Signed)
[name] [facility] AT [facility] Provider Note   CSN: [id number] Arrival date & time: 10/08/22  1624     History  Chief Complaint  Patient presents with   Abdominal Pain    Samuel Blevins is a 57 y.o. male with a past medical history significant for recurrent pancreatitis, chronic pancreatic insufficiency, hypertension, recurrent alcohol use who presents to the ED due to diffuse abdominal pain.  Patient states abdominal pain has been present ever since leaving AGAINST MEDICAL ADVICE during his most recent admission on 7/24.  He admits to a few episodes of nonbloody, nonbilious emesis.  Also admits to chest pain and shortness of breath.  Last drank 2 days ago.  No recent fever or chills.  Has had a previous history of ascites and SBP.  Denies any abdominal distention.  No urinary symptoms. Recent biliary stent exchange on 8/6.   History obtained from patient and past medical records. No interpreter used during encounter.       Home Medications Prior to Admission medications   Medication Sig Start Date End Date Taking? Authorizing Provider  carvedilol (COREG) 3.125 MG tablet Take 1 tablet (3.125 mg total) by mouth 2 (two) times daily with a meal. 10/02/22   Hughie Closs, MD  folic acid (FOLVITE) 1 MG tablet Take 1 tablet (1 mg total) by mouth daily. 10/02/22   Hughie Closs, MD  hydrOXYzine (ATARAX) 25 MG tablet Take 1 tablet (25 mg total) by mouth at bedtime as needed for anxiety. 10/02/22   Hughie Closs, MD  Iron, Ferrous Sulfate, 325 (65 Fe) MG TABS Take 1 tablet ( 325 mg) by mouth daily. 10/02/22   Hughie Closs, MD  lipase/protease/amylase (CREON) 12000-38000 units CPEP capsule Take 1 capsule (12,000 Units total) by mouth 3 (three) times daily before meals. 10/02/22 12/31/22  Hughie Closs, MD  magnesium oxide (MAG-OX) 400 MG tablet Take 1 tablet (400 mg total) by mouth in the morning and at bedtime. 10/02/22   Hughie Closs, MD  Multiple Vitamin (MULTIVITAMIN WITH  MINERALS) TABS tablet Take 1 tablet by mouth daily. Patient not taking: Reported on 10/03/2022 07/14/22   Rolly Salter, MD  nicotine (NICODERM CQ - DOSED IN MG/24 HOURS) 14 mg/24hr patch Place 1 patch (14 mg total) onto the skin daily. Patient not taking: Reported on 10/03/2022 10/02/22   Hughie Closs, MD  ondansetron (ZOFRAN) 8 MG tablet Take 1 tablet (8 mg total) by mouth every 8 (eight) hours as needed for nausea or vomiting. 10/03/22   [name], MD  pantoprazole (PROTONIX) 40 MG tablet Take 1 tablet (40 mg total) by mouth 2 (two) times daily before a meal. 10/02/22   Hughie Closs, MD  polyethylene glycol (MIRALAX / GLYCOLAX) 17 g packet Take 17 g by mouth daily as needed for mild constipation or moderate constipation. Patient not taking: Reported on 10/03/2022 07/14/22   Rolly Salter, MD  sucralfate (CARAFATE) 1 g tablet Take 1 tablet by mouth 4 times daily with meals and bedtime 10/02/22   Hughie Closs, MD  thiamine (VITAMIN B1) 100 MG tablet Take 1 tablet (100 mg total) by mouth daily. 10/02/22   Hughie Closs, MD  traMADol (ULTRAM) 50 MG tablet Take 1 tablet (50 mg total) by mouth every 8 (eight) hours as needed for up to 5 days. 10/03/22 10/13/22  [name], MD  vancomycin (VANCOCIN) 125 MG capsule Take 1 capsule (125 mg total) by mouth in the morning and at bedtime for 7  days. 10/02/22 10/09/22  Hughie Closs, MD      Allergies    Patient has no known allergies.    Review of Systems   Review of Systems  Respiratory:  Positive for shortness of breath.   Cardiovascular:  Positive for chest pain.  Gastrointestinal:  Positive for abdominal pain, nausea and vomiting.    Physical Exam Updated Vital Signs BP 136/77 (BP Location: Left Arm)   Pulse (!) 103   Temp 98.5 F (36.9 C) (Oral)   Resp 16   Ht 5\' 9"  (1.753 m)   Wt 68.4 kg   SpO2 100%   BMI 22.27 kg/m  Physical Exam Vitals and nursing note reviewed.  Constitutional:      General: He is not in acute distress.     Appearance: He is not ill-appearing.  HENT:     Head: Normocephalic.  Eyes:     Pupils: Pupils are equal, round, and reactive to light.  Cardiovascular:     Rate and Rhythm: Normal rate and regular rhythm.     Pulses: Normal pulses.     Heart sounds: Normal heart sounds. No murmur heard.    No friction rub. No gallop.  Pulmonary:     Effort: Pulmonary effort is normal.     Breath sounds: Normal breath sounds.  Abdominal:     General: Abdomen is flat. There is no distension.     Palpations: Abdomen is soft.     Tenderness: There is abdominal tenderness. There is guarding. There is no rebound.     Comments: Diffuse tenderness. Will not let me do complete exam, pulls my arm away. Guarding.   Musculoskeletal:        General: Normal range of motion.     Cervical back: Neck supple.  Skin:    General: Skin is warm and dry.  Neurological:     General: No focal deficit present.     Mental Status: He is alert.  Psychiatric:        Mood and Affect: Mood normal.        Behavior: Behavior normal.     ED Results / Procedures / Treatments   Labs (all labs ordered are listed, but only abnormal results are displayed) Labs Reviewed  CBC - Abnormal; Notable for the following components:      Result Value   RBC 3.17 (*)    Hemoglobin 8.6 (*)    HCT 28.2 (*)    RDW 22.8 (*)    Platelets 507 (*)    All other components within normal limits  COMPREHENSIVE METABOLIC PANEL - Abnormal; Notable for the following components:   Potassium 3.3 (*)    CO2 20 (*)    Glucose, Bld 102 (*)    BUN <5 (*)    Total Protein 8.3 (*)    Albumin 2.7 (*)    All other components within normal limits  LIPASE, BLOOD - Abnormal; Notable for the following components:   Lipase 210 (*)    All other components within normal limits  URINALYSIS, ROUTINE W REFLEX MICROSCOPIC - Abnormal; Notable for the following components:   APPearance HAZY (*)    Protein, ur 30 (*)    Leukocytes,Ua LARGE (*)    Bacteria, UA  FEW (*)    Non Squamous Epithelial 0-5 (*)    All other components within normal limits  URINE CULTURE  I-STAT CG4 LACTIC ACID, ED  I-STAT CG4 LACTIC ACID, ED  TROPONIN I (HIGH SENSITIVITY)  TROPONIN I (  HIGH SENSITIVITY)    EKG None  Radiology CT ABDOMEN PELVIS W CONTRAST  Result Date: 10/08/2022 CLINICAL DATA:  Abdominal pain EXAM: CT ABDOMEN AND PELVIS WITH CONTRAST TECHNIQUE: Multidetector CT imaging of the abdomen and pelvis was performed using the standard protocol following bolus administration of intravenous contrast. RADIATION DOSE REDUCTION: This exam was performed according to the departmental dose-optimization program which includes automated exposure control, adjustment of the mA and/or kV according to patient size and/or use of iterative reconstruction technique. CONTRAST:  75mL OMNIPAQUE IOHEXOL 350 MG/ML SOLN COMPARISON:  09/25/2022 FINDINGS: Lower chest: Mild dependent atelectasis. Hepatobiliary: Liver is within normal limits. Layering small gallstones in the gallbladder fundus (series 3/image 33), without associated inflammatory changes. Pancreas: Parenchymal calcifications in the pancreatic head/uncinate process, reflecting sequela of prior/chronic pancreatitis. Indwelling pancreatic duct stent. Prior large pseudocyst along the posterior aspect of the stomach is significantly improved, now with a residual 2.9 x 6.2 cm fluid and gas collection (series 3/image 19), predominantly gas. Spleen: Within normal limits. Adrenals/Urinary Tract: Adrenal glands are within normal limits. Kidneys are within normal limits.  No hydronephrosis. Bladder is unremarkable. Stomach/Bowel: Stomach is within normal limits. No evidence of bowel obstruction. Appendix is not discretely visualized. No colonic wall thickening or inflammatory changes. Vascular/Lymphatic: No evidence of abdominal aortic aneurysm. Atherosclerotic calcifications of the abdominal aorta and branch vessels, although vessels remain  patent. No suspicious abdominopelvic lymphadenopathy. Reproductive: Enlargement of the central gland of the prostate which indents the base of the bladder, suggesting BPH. Other: Trace perihepatic fluid. Associated 2.9 x 1.8 cm walled-off fluid collection inferior to the right liver (series 3/image 37). Prior pigtail surgical drain has been removed. Additional left mid abdominal pigtail surgical drain has been removed, without residual fluid collection. Musculoskeletal: Degenerative changes of the lower lumbar spine. IMPRESSION: Near complete resolution of prior large pseudocyst posterior to the stomach. Mild residual fluid/gas persists, as above. Interval removal of left mid abdominal and right lateral abdominal surgical drains. New/residual 2.9 cm fluid collection inferior to the right liver. Sequela of prior/chronic pancreatitis with indwelling pancreatic ductal stent. Cholelithiasis, without associated inflammatory changes. Electronically Signed   By: Charline Bills M.D.   On: 10/08/2022 21:23    Procedures Procedures    Medications Ordered in ED Medications  sodium chloride 0.9 % bolus 1,000 mL (0 mLs Intravenous Stopped 10/08/22 2119)  ondansetron (ZOFRAN) injection 4 mg (4 mg Intravenous Given 10/08/22 2020)  morphine (PF) 4 MG/ML injection 4 mg (4 mg Intravenous Given 10/08/22 2020)  iohexol (OMNIPAQUE) 350 MG/ML injection 75 mL (75 mLs Intravenous Contrast Given 10/08/22 2112)  HYDROmorphone (DILAUDID) injection 1 mg (1 mg Intravenous Given 10/08/22 2151)    ED Course/ Medical Decision Making/ A&P Clinical Course as of 10/08/22 2217  Tue Oct 08, 2022  2143 ECG not crossing in epic.  Sinus tachycardia rate of 109 no acute ST-T's. [MB]  2211 Bacteria, UA(!): FEW [CA]  2211 WBC, UA: 21-50 [CA]  2211 Leukocytes,Ua(!): LARGE [CA]    Clinical Course User Index [CA] Mannie Stabile, PA-C [MB] Terrilee Files, MD                                 Medical Decision Making Amount and/or  Complexity of Data Reviewed External Data Reviewed: notes.    Details: Admission notes Labs: ordered. Decision-making details documented in ED Course. Radiology: ordered and independent interpretation performed. Decision-making details documented in ED Course. ECG/medicine tests:  ordered and independent interpretation performed. Decision-making details documented in ED Course.  Risk Prescription drug management. Decision regarding hospitalization.   This patient presents to the ED for concern of abdominal pain, this involves an extensive number of treatment options, and is a complaint that carries with it a high risk of complications and morbidity.  The differential diagnosis includes pancreatitis, gastroenteritis, appendicitis, etc  56 year old male presents to the ED due to generalized abdominal pain that has been ongoing since leaving AGAINST MEDICAL ADVICE on 7/24.  History of recurrent pancreatitis.  Had a biliary stent exchanged on 8/6.  Also admits to chest pain and shortness of breath.  History of alcohol abuse.  Last drank 2 days ago.  Upon arrival patient afebrile, mildly tachycardic 106 with otherwise reassuring vitals.  Patient appears uncomfortable.  Abdomen soft, nondistended with diffuse tenderness.  Difficult to obtain full abdominal exam because patient pulls away my hand and does not let me palpate thoroughly.  Routine labs ordered in triage.  Added lactic acid given patient is shivering on exam.  Will recheck vitals.  IV fluids, Zofran, morphine given.  UA to rule out UTI.  CT abdomen ordered. Added cardiac labs given CP and SOB; however feel it is likely referred pain from abdomen.  CBC without leukocytosis.  Anemia with hemoglobin 8.6 which appears to be around patient's baseline.  Thrombocytosis of 507.  Lipase elevated to 10.  CMP significant for hypokalemia 3.3.  Normal creatinine.  CT abdomen personally viewed and interpreted which demonstrates near complete resolution of  prior pseudocyst.  Does demonstrate new/residual 2.9 cm fluid collection inferior to the right liver.  Sequela of chronic pancreatitis.  Cholelithiasis without inflammatory changes.  Low suspicion for acute cholecystitis. Normal LFTs. UA significant for large leukocytes, 21-50 white blood cells and few bacteria.  Appears contaminated.  Urine culture pending.  Will hold on antibiotics at this time.  Suspect pain secondary to pancreatitis.  9:47 PM reassessed patient at bedside.  Patient still admits to significant amount of pain.  Will discuss with hospitalist for admission for pancreatitis.  Dilaudid given.  10:09 PM Discussed with Dr. Joneen Roach with [facility] who agrees to admit patient  Lives at home Hx alcohol use Has PCP        Final Clinical Impression(s) / ED Diagnoses Final diagnoses:  Acute pancreatitis, unspecified complication status, unspecified pancreatitis type    Rx / DC Orders ED Discharge Orders     None         Jesusita Oka 10/08/22 2228    Terrilee Files, MD 10/09/22 [phone]

## 2022-10-08 NOTE — ED Provider Triage Note (Signed)
Emergency Medicine Provider Triage Evaluation Note  Samuel Blevins , a 57 y.o. male  was evaluated in triage.  Pt complains of abdominal pain 1 week s/p ERCP with removal and exchange of stents.  Review of Systems  Positive: Abdominal pain, diarrhea Negative: Fever  Physical Exam  BP (!) 138/92 (BP Location: Left Arm)   Pulse (!) 106   Temp 98 F (36.7 C) (Oral)   Resp 18   Ht 5\' 9"  (1.753 m)   Wt 68.4 kg   SpO2 96%   BMI 22.27 kg/m  Gen:   Awake, no distress   Resp:  Normal effort  MSK:   Moves extremities without difficulty  Other:  Diffuse abdominal pain  Medical Decision Making  Medically screening exam initiated at 4:37 PM.  Appropriate orders placed.  Rodena Medin Trethewey was informed that the remainder of the evaluation will be completed by another provider, this initial triage assessment does not replace that evaluation, and the importance of remaining in the ED until their evaluation is complete.   Maxwell Marion, PA-C 10/08/22 1639

## 2022-10-09 ENCOUNTER — Other Ambulatory Visit: Payer: Self-pay

## 2022-10-09 DIAGNOSIS — R569 Unspecified convulsions: Secondary | ICD-10-CM | POA: Diagnosis not present

## 2022-10-09 DIAGNOSIS — K859 Acute pancreatitis without necrosis or infection, unspecified: Secondary | ICD-10-CM

## 2022-10-09 DIAGNOSIS — Z720 Tobacco use: Secondary | ICD-10-CM

## 2022-10-09 DIAGNOSIS — G894 Chronic pain syndrome: Secondary | ICD-10-CM | POA: Diagnosis not present

## 2022-10-09 DIAGNOSIS — I10 Essential (primary) hypertension: Secondary | ICD-10-CM

## 2022-10-09 DIAGNOSIS — E43 Unspecified severe protein-calorie malnutrition: Secondary | ICD-10-CM

## 2022-10-09 DIAGNOSIS — F10939 Alcohol use, unspecified with withdrawal, unspecified: Secondary | ICD-10-CM

## 2022-10-09 DIAGNOSIS — K861 Other chronic pancreatitis: Secondary | ICD-10-CM

## 2022-10-09 LAB — CBC WITH DIFFERENTIAL/PLATELET
Abs Immature Granulocytes: 0.02 10*3/uL (ref 0.00–0.07)
Basophils Absolute: 0.1 10*3/uL (ref 0.0–0.1)
Basophils Relative: 1 %
Eosinophils Absolute: 0.1 10*3/uL (ref 0.0–0.5)
Eosinophils Relative: 2 %
HCT: 25.5 % — ABNORMAL LOW (ref 39.0–52.0)
Hemoglobin: 8 g/dL — ABNORMAL LOW (ref 13.0–17.0)
Immature Granulocytes: 1 %
Lymphocytes Relative: 48 %
Lymphs Abs: 2.2 10*3/uL (ref 0.7–4.0)
MCH: 27.2 pg (ref 26.0–34.0)
MCHC: 31.4 g/dL (ref 30.0–36.0)
MCV: 86.7 fL (ref 80.0–100.0)
Monocytes Absolute: 0.5 10*3/uL (ref 0.1–1.0)
Monocytes Relative: 11 %
Neutro Abs: 1.7 10*3/uL (ref 1.7–7.7)
Neutrophils Relative %: 37 %
Platelets: 459 10*3/uL — ABNORMAL HIGH (ref 150–400)
RBC: 2.94 MIL/uL — ABNORMAL LOW (ref 4.22–5.81)
RDW: 22.4 % — ABNORMAL HIGH (ref 11.5–15.5)
WBC: 4.4 10*3/uL (ref 4.0–10.5)
nRBC: 0 % (ref 0.0–0.2)

## 2022-10-09 LAB — BASIC METABOLIC PANEL
Anion gap: 11 (ref 5–15)
BUN: <5 mg/dL — ABNORMAL LOW (ref 6–20)
CO2: 21 mmol/L — ABNORMAL LOW (ref 22–32)
Calcium: 8.5 mg/dL — ABNORMAL LOW (ref 8.9–10.3)
Chloride: 106 mmol/L (ref 98–111)
Creatinine, Ser: 0.74 mg/dL (ref 0.61–1.24)
GFR, Estimated: >60 mL/min (ref >60)
Glucose, Bld: 88 mg/dL (ref 70–99)
Potassium: 3.6 mmol/L (ref 3.5–5.1)
Sodium: 138 mmol/L (ref 135–145)

## 2022-10-09 LAB — PROTIME-INR
INR: 1.1 (ref 0.8–1.2)
Prothrombin Time: 14.4 seconds (ref 11.4–15.2)

## 2022-10-09 MED ORDER — SUCRALFATE 1 G PO TABS
1.0000 g | ORAL_TABLET | Freq: Four times a day (QID) | ORAL | Status: DC
  Administered 2022-10-09 – 2022-10-12 (×12): 1 g via ORAL
  Filled 2022-10-09 (×13): qty 1

## 2022-10-09 MED ORDER — HYDROXYZINE HCL 25 MG PO TABS: 25.0000 mg | ORAL_TABLET | Freq: Every evening | ORAL | Status: DC | PRN

## 2022-10-09 MED ORDER — THIAMINE HCL 100 MG/ML IJ SOLN
100.0000 mg | Freq: Every day | INTRAMUSCULAR | Status: DC
  Filled 2022-10-09: qty 2

## 2022-10-09 MED ORDER — THIAMINE MONONITRATE 100 MG PO TABS
100.0000 mg | ORAL_TABLET | Freq: Every day | ORAL | Status: DC
  Administered 2022-10-09 – 2022-10-12 (×5): 100 mg via ORAL
  Filled 2022-10-09 (×5): qty 1

## 2022-10-09 MED ORDER — VANCOMYCIN HCL 125 MG PO CAPS
125.0000 mg | ORAL_CAPSULE | Freq: Three times a day (TID) | ORAL | Status: DC
  Administered 2022-10-09 – 2022-10-10 (×6): 125 mg via ORAL
  Filled 2022-10-09 (×9): qty 1

## 2022-10-09 MED ORDER — VANCOMYCIN HCL 125 MG PO CAPS: 125.0000 mg | ORAL_CAPSULE | Freq: Four times a day (QID) | ORAL | Status: DC

## 2022-10-09 MED ORDER — CARVEDILOL 3.125 MG PO TABS
3.1250 mg | ORAL_TABLET | Freq: Two times a day (BID) | ORAL | Status: DC
  Administered 2022-10-09 – 2022-10-12 (×6): 3.125 mg via ORAL
  Filled 2022-10-09 (×6): qty 1

## 2022-10-09 NOTE — Progress Notes (Signed)
PROGRESS NOTE    Samuel Blevins  BMW:[id number] DOB: 26-Dec-1965 DOA: 10/08/2022 PCP: [name], MD    Brief Narrative:  Patient is a 57 year old male with past medical history of alcohol abuse, recurrent pancreatitis, recurrent ascites, recent biliary stent placements, HTN who was recently discharged from the hospital on 10/02/2022 after being treated for SBP, recurrent VRE positive ascites (amyloid positive) and ongoing abdominal pain.  GI was concerned for pancreatic duct disruption.  The patient was transferred to [facility] on 7/23 and 8/6 for stenting for pancreatic duct disruption.  During that hospitalization, patient had 5 separate paracentesis done.  Postprocedure patient's CT abdomen showed pneumoperitoneum as well as several diffuse loculated regions in the abdomen concerning for abscess.  Dr. Corliss Parish GI at [facility] confirmed that pneumoperitoneum was expected given their approach through the gastric wall done to place stent.  CT done 24/25 showed abscess that was drained by IR.  His abscess grew Proteus species.  He was treated inpatient with IV antibiotics.  He was also C. difficile positive.  The patient was discharged on prophylactic vancomycin.  Since the discharge from the hospital, patient continues to have generalized abdominal pain as well as shortness of breath.   In the ED, patient was afebrile.  Had mild thrombocytosis with lipase of 210.   CT abdomen pelvis shows near complete resolution of prior large pseudocyst posterior to the stomach. Mild residual fluid/gas persists. Interval removal of left mid abdominal and right lateral abdominal surgical drains. New/residual 2.9 cm fluid collection inferior to the right liver. Sequela of prior/chronic pancreatitis with indwelling pancreatic ductal stent.  Assessment and plan.  Abdominal pain secondary to acute on chronic alcoholic pancreatitis Complicated previous history.  Continues to drink at home.  CT scan with improvement in  findings.  Continue conservative treatment including n.p.o. IV fluids analgesics antiemetics.  Ammonia was 34 on presentation.  INR of 1.1.  Will gradually advance diet when tolerated.  Continue IV fluids with KCl at 150 mL/h.  Continue thiamine, folic acid, Atarax.    recurrent C. difficile colitis.  Patient states he completed meds however this is questionable.  C. Difficile, GI pathogen panel pending.  Has been empirically started on oral vancomycin..  Alcoholic gastritis.  Continue Protonix IV twice daily.  Recurrent VRE SBP.  Patient without ascites.  This is less likely.     Chronic alcohol abuse -CIWA protocol initiated, continue Thiamine, folate, MVI.   Hypokalemia Continue IV potassium replacement.   Essential hypertension Continue Coreg.   Protein-calorie malnutrition, severe Body mass index is 22.27 kg/m.  admission.  Albumin level of 2.7.  Consult dietitian    Tobacco abuse Continue nicotine patch.     DVT prophylaxis: heparin injection 5,000 Units Start: 10/09/22 1400   Code Status:     Code Status: Full Code  Disposition: Home likely in 1 to 2 days  Status is: Inpatient  Remains inpatient appropriate because: Acute pancreatitis, IV fluids, pending clinical improvement.   Family Communication: None at bedside  Consultants:  None  Procedures:  None  Antimicrobials:  Oral vancomycin  Anti-infectives (From admission, onward)    Start     Dose/Rate Route Frequency Ordered Stop   10/09/22 1000  vancomycin (VANCOCIN) capsule 125 mg  Status:  Discontinued        125 mg Oral 4 times daily 10/09/22 0552 10/09/22 0553   10/09/22 0630  vancomycin (VANCOCIN) capsule 125 mg        125 mg Oral 3 times  daily before meals & bedtime 10/09/22 0553 10/19/22 0759       Subjective: Today, patient was seen and examined at bedside.  Patient states that he has some abdominal pain, smells of EtOH.  Denies any nausea vomiting fever or chills at this time.  Has not had a  bowel movement since admission.  Objective: Vitals:   10/08/22 1922 10/09/22 0038 10/09/22 0507 10/09/22 0736  BP: 136/77 131/87 (!) 156/95 (!) 144/98  Pulse: (!) 103 (!) 109 100 99  Resp: 16 18 18 18   Temp: 98.5 F (36.9 C) 97.7 F (36.5 C) 98.1 F (36.7 C) 98.1 F (36.7 C)  TempSrc: Oral Oral Oral Oral  SpO2: 100% 100% 100% 100%  Weight:      Height:        Intake/Output Summary (Last 24 hours) at 10/09/2022 1032 Last data filed at 10/09/2022 0323 Gross per 24 hour  Intake 1206.11 ml  Output --  Net 1206.11 ml   Filed Weights   10/08/22 1626  Weight: 68.4 kg    Physical Examination: Body mass index is 22.27 kg/m.   General:  Average built, not in obvious distress, not in obvious distress, alert awake and Communicative, HENT:   No scleral pallor or icterus noted. Oral mucosa is moist.  Chest:  Clear breath sounds.  No crackles or wheezes.  CVS: S1 &S2 heard. No murmur.  Regular rate and rhythm. Abdomen: Soft, mild tenderness over the epigastric region., nondistended.  Bowel sounds are heard.   Extremities: No cyanosis, clubbing or edema.  Psych: Alert, awake and oriented, normal mood CNS:  No cranial nerve deficits.  Power equal in all extremities.   Skin: Warm and dry.  No rashes noted.  Data Reviewed:   CBC: Recent Labs  Lab 10/08/22 1645 10/08/22 2337  WBC 5.0 4.2  NEUTROABS  --  1.8  HGB 8.6* 8.1*  HCT 28.2* 25.4*  MCV 89.0 88.5  PLT 507* 488*    Basic Metabolic Panel: Recent Labs  Lab 10/08/22 1645 10/08/22 2337  NA 135 134*  K 3.3* 3.4*  CL 101 102  CO2 20* 23  GLUCOSE 102* 103*  BUN <5* <5*  CREATININE 0.64 0.62  CALCIUM 8.9 8.5*  MG  --  1.2*  PHOS  --  4.3    Liver Function Tests: Recent Labs  Lab 10/08/22 1645 10/08/22 2337  AST 36 30  ALT 15 12  ALKPHOS 100 87  BILITOT 0.3 0.7  PROT 8.3* 7.8  ALBUMIN 2.7* 2.4*     Radiology Studies: CT ABDOMEN PELVIS W CONTRAST  Result Date: 10/08/2022 CLINICAL DATA:  Abdominal  pain EXAM: CT ABDOMEN AND PELVIS WITH CONTRAST TECHNIQUE: Multidetector CT imaging of the abdomen and pelvis was performed using the standard protocol following bolus administration of intravenous contrast. RADIATION DOSE REDUCTION: This exam was performed according to the departmental dose-optimization program which includes automated exposure control, adjustment of the mA and/or kV according to patient size and/or use of iterative reconstruction technique. CONTRAST:  75mL OMNIPAQUE IOHEXOL 350 MG/ML SOLN COMPARISON:  09/25/2022 FINDINGS: Lower chest: Mild dependent atelectasis. Hepatobiliary: Liver is within normal limits. Layering small gallstones in the gallbladder fundus (series 3/image 33), without associated inflammatory changes. Pancreas: Parenchymal calcifications in the pancreatic head/uncinate process, reflecting sequela of prior/chronic pancreatitis. Indwelling pancreatic duct stent. Prior large pseudocyst along the posterior aspect of the stomach is significantly improved, now with a residual 2.9 x 6.2 cm fluid and gas collection (series 3/image 19), predominantly gas. Spleen: Within  normal limits. Adrenals/Urinary Tract: Adrenal glands are within normal limits. Kidneys are within normal limits.  No hydronephrosis. Bladder is unremarkable. Stomach/Bowel: Stomach is within normal limits. No evidence of bowel obstruction. Appendix is not discretely visualized. No colonic wall thickening or inflammatory changes. Vascular/Lymphatic: No evidence of abdominal aortic aneurysm. Atherosclerotic calcifications of the abdominal aorta and branch vessels, although vessels remain patent. No suspicious abdominopelvic lymphadenopathy. Reproductive: Enlargement of the central gland of the prostate which indents the base of the bladder, suggesting BPH. Other: Trace perihepatic fluid. Associated 2.9 x 1.8 cm walled-off fluid collection inferior to the right liver (series 3/image 37). Prior pigtail surgical drain has been  removed. Additional left mid abdominal pigtail surgical drain has been removed, without residual fluid collection. Musculoskeletal: Degenerative changes of the lower lumbar spine. IMPRESSION: Near complete resolution of prior large pseudocyst posterior to the stomach. Mild residual fluid/gas persists, as above. Interval removal of left mid abdominal and right lateral abdominal surgical drains. New/residual 2.9 cm fluid collection inferior to the right liver. Sequela of prior/chronic pancreatitis with indwelling pancreatic ductal stent. Cholelithiasis, without associated inflammatory changes. Electronically Signed   By: Charline Bills M.D.   On: 10/08/2022 21:23      LOS: 1 day   Joycelyn Das, MD [facility] Available via Epic secure chat 7am-7pm After these hours, please refer to coverage provider listed on [email or web address] 10/09/2022, 10:32 AM

## 2022-10-09 NOTE — Plan of Care (Signed)

## 2022-10-09 NOTE — Plan of Care (Signed)
  Problem: Education: Goal: Knowledge of General Education information will improve Description: Including pain rating scale, medication(s)/side effects and non-pharmacologic comfort measures 10/09/2022 0332 by Heywood Bene, RN Outcome: Progressing 10/09/2022 0323 by Heywood Bene, RN Outcome: Progressing   Problem: Health Behavior/Discharge Planning: Goal: Ability to manage health-related needs will improve 10/09/2022 0332 by Heywood Bene, RN Outcome: Progressing 10/09/2022 0323 by Heywood Bene, RN Outcome: Progressing   Problem: Clinical Measurements: Goal: Ability to maintain clinical measurements within normal limits will improve 10/09/2022 0332 by Heywood Bene, RN Outcome: Progressing 10/09/2022 0323 by Heywood Bene, RN Outcome: Progressing Goal: Will remain free from infection 10/09/2022 0332 by Heywood Bene, RN Outcome: Progressing 10/09/2022 0323 by Heywood Bene, RN Outcome: Progressing Goal: Diagnostic test results will improve 10/09/2022 0332 by Heywood Bene, RN Outcome: Progressing 10/09/2022 0323 by Heywood Bene, RN Outcome: Progressing Goal: Respiratory complications will improve 10/09/2022 0332 by Heywood Bene, RN Outcome: Progressing 10/09/2022 0323 by Heywood Bene, RN Outcome: Progressing Goal: Cardiovascular complication will be avoided 10/09/2022 0332 by Heywood Bene, RN Outcome: Progressing 10/09/2022 0323 by Heywood Bene, RN Outcome: Progressing   Problem: Activity: Goal: Risk for activity intolerance will decrease 10/09/2022 0332 by Heywood Bene, RN Outcome: Progressing 10/09/2022 0323 by Heywood Bene, RN Outcome: Progressing   Problem: Nutrition: Goal: Adequate nutrition will be maintained 10/09/2022 0332 by Heywood Bene, RN Outcome: Progressing 10/09/2022 0323 by Heywood Bene, RN Outcome: Progressing   Problem: Coping: Goal: Level of anxiety will decrease 10/09/2022 0332 by Heywood Bene, RN Outcome: Progressing 10/09/2022 0323 by Heywood Bene, RN Outcome: Progressing   Problem: Elimination: Goal: Will not experience complications related to bowel motility 10/09/2022 0332 by Heywood Bene, RN Outcome: Progressing 10/09/2022 0323 by Heywood Bene, RN Outcome: Progressing Goal: Will not experience complications related to urinary retention 10/09/2022 0332 by Heywood Bene, RN Outcome: Progressing 10/09/2022 0323 by Heywood Bene, RN Outcome: Progressing   Problem: Pain Managment: Goal: General experience of comfort will improve 10/09/2022 0332 by Heywood Bene, RN Outcome: Progressing 10/09/2022 0323 by Heywood Bene, RN Outcome: Progressing   Problem: Safety: Goal: Ability to remain free from injury will improve 10/09/2022 0332 by Heywood Bene, RN Outcome: Progressing 10/09/2022 0323 by Heywood Bene, RN Outcome: Progressing   Problem: Skin Integrity: Goal: Risk for impaired skin integrity will decrease 10/09/2022 0332 by Heywood Bene, RN Outcome: Progressing 10/09/2022 0323 by Heywood Bene, RN Outcome: Progressing

## 2022-10-09 NOTE — Progress Notes (Incomplete)
Initial Nutrition Assessment  DOCUMENTATION CODES:   Not applicable  INTERVENTION:  - Add Boost Breeze po BID, each supplement provides 250 kcal and 9 grams of protein  - Continue MVI q day.   NUTRITION DIAGNOSIS:   Inadequate oral intake related to inability to eat as evidenced by other (comment) (CL diet).  GOAL:   Patient will meet greater than or equal to 90% of their needs  MONITOR:   PO intake, Supplement acceptance, Weight trends  REASON FOR ASSESSMENT:   Consult Assessment of nutrition requirement/status  ASSESSMENT:   57 y.o. male admits related to abdominal pain. PMH includes: alcohol abuse, tobacco abuse, chronic pancreatitis, seizures. Pt is currently receiving medical management related to acute on chronic pancreatitis.  Meds reviewed: folic acid, MVI, miralax, carafate, thiamine. Labs reviewed: BUN low, Mag low.   The pt is currently on a CL diet. Pt is oriented and able to answer questions but was very short with responses. The pt reports that he has a good appetite. Pt reports that he has lost some weight but is unsure of how much. Pt did agree to try Ensure Clear BID.  RD will add supplements and continue to monitor.   NUTRITION - FOCUSED PHYSICAL EXAM:  Pt declined.   Diet Order:   Diet Order             Diet clear liquid Room service appropriate? Yes; Fluid consistency: Thin  Diet effective now                   EDUCATION NEEDS:   Not appropriate for education at this time  Skin:  Skin Assessment: Reviewed RN Assessment  Last BM:  8/15 - type 6  Height:   Ht Readings from Last 1 Encounters:  10/08/22 5\' 9"  (1.753 m)    Weight:   Wt Readings from Last 1 Encounters:  10/08/22 68.4 kg    Ideal Body Weight:     BMI:  Body mass index is 22.27 kg/m.  Estimated Nutritional Needs:   Kcal:  1800-2100 kcals  Protein:  90-115 gm  Fluid:  >/= 1.8 L  Bethann Humble, RD, LDN, CNSC.

## 2022-10-09 NOTE — Hospital Course (Addendum)
Patient is a 57 year old male with past medical history of alcohol abuse, recurrent pancreatitis, recurrent ascites, recent biliary stent placements, HTN who was recently discharged from the hospital on 10/02/2022 after being treated for SBP, recurrent VRE positive ascites (amyloid positive) and ongoing abdominal pain.  GI was concerned for pancreatic duct disruption.  The patient was transferred to [facility] on 7/23 and 8/6 for stenting for pancreatic duct disruption.  During that hospitalization, patient had 5 separate paracentesis done.  Postprocedure patient's CT abdomen showed pneumoperitoneum as well as several diffuse loculated regions in the abdomen concerning for abscess.  Dr. Corliss Parish GI at [facility] confirmed that pneumoperitoneum was expected given their approach through the gastric wall done to place stent.  CT done 24/25 showed abscess that was drained by IR.  His abscess grew Proteus species.  He was treated inpatient with IV antibiotics.  He was also C. difficile positive.  The patient was discharged on prophylactic vancomycin.  Since the discharge from the hospital patient continues to have generalized abdominal pain as well as shortness of breath.   In the ED patient was afebrile.  Had mild thrombocytosis with lipase of 210.   CT abdomen pelvis shows near complete resolution of prior large pseudocyst posterior to the stomach. Mild residual fluid/gas persists. Interval removal of left mid abdominal and right lateral abdominal surgical drains. New/residual 2.9 cm fluid collection inferior to the right liver. Sequela of prior/chronic pancreatitis with indwelling pancreatic ductal stent.  Assessment and plan.  Abdominal pain  Acute on chronic pancreatitis Complicated previous history.  CT scan with improvement in findings.  Continue conservative treatment including n.p.o. IV fluids analgesics antiemetics.  Ammonia was 34 on presentation.  INR of 1.1.  recurrent C. difficile colitis.  Patient states he  completed meds however this is questionable.  C. Difficile, GI pathogen panel pending.  Has been empirically started on oral vancomycin..  Alcoholic gastritis.  Continue Protonix IV twice daily.  Recurrent VRE SBP.  Patient without ascites.  This is less likely.  However ESR, lactic acid has been ordered.   Chronic alcohol abuse -CIWA protocol initiated, continue Thiamine, folate, MVI   Hypokalemia Continue IV potassium replacement.   Essential hypertension -Normotensive   Protein-calorie malnutrition, severe Send on admission.  Albumin level of 2.7.  Consult dietitian at some point.     Tobacco abuse Continue nicotine patch.

## 2022-10-10 DIAGNOSIS — Z720 Tobacco use: Secondary | ICD-10-CM | POA: Diagnosis not present

## 2022-10-10 DIAGNOSIS — K859 Acute pancreatitis without necrosis or infection, unspecified: Secondary | ICD-10-CM | POA: Diagnosis not present

## 2022-10-10 DIAGNOSIS — R569 Unspecified convulsions: Secondary | ICD-10-CM | POA: Diagnosis not present

## 2022-10-10 DIAGNOSIS — G894 Chronic pain syndrome: Secondary | ICD-10-CM | POA: Diagnosis not present

## 2022-10-10 LAB — COMPREHENSIVE METABOLIC PANEL
ALT: 14 U/L (ref 0–44)
ALT: 15 U/L (ref 0–44)
AST: 32 U/L (ref 15–41)
AST: 31 U/L (ref 15–41)
Albumin: 2.1 g/dL — ABNORMAL LOW (ref 3.5–5.0)
Albumin: 2.3 g/dL — ABNORMAL LOW (ref 3.5–5.0)
Alkaline Phosphatase: 84 U/L (ref 38–126)
Alkaline Phosphatase: 97 U/L (ref 38–126)
Anion gap: 10 (ref 5–15)
Anion gap: 9 (ref 5–15)
BUN: <5 mg/dL — ABNORMAL LOW (ref 6–20)
BUN: <5 mg/dL — ABNORMAL LOW (ref 6–20)
CO2: 20 mmol/L — ABNORMAL LOW (ref 22–32)
CO2: 20 mmol/L — ABNORMAL LOW (ref 22–32)
Calcium: 8.2 mg/dL — ABNORMAL LOW (ref 8.9–10.3)
Calcium: 8.5 mg/dL — ABNORMAL LOW (ref 8.9–10.3)
Chloride: 110 mmol/L (ref 98–111)
Chloride: 110 mmol/L (ref 98–111)
Creatinine, Ser: 0.76 mg/dL (ref 0.61–1.24)
Creatinine, Ser: 0.64 mg/dL (ref 0.61–1.24)
GFR, Estimated: >60 mL/min (ref >60)
GFR, Estimated: >60 mL/min (ref >60)
Glucose, Bld: 74 mg/dL (ref 70–99)
Glucose, Bld: 69 mg/dL — ABNORMAL LOW (ref 70–99)
Potassium: 3.9 mmol/L (ref 3.5–5.1)
Potassium: 4.2 mmol/L (ref 3.5–5.1)
Sodium: 140 mmol/L (ref 135–145)
Sodium: 139 mmol/L (ref 135–145)
Total Bilirubin: 0.3 mg/dL (ref 0.3–1.2)
Total Bilirubin: 1.1 mg/dL (ref 0.3–1.2)
Total Protein: 7.1 g/dL (ref 6.5–8.1)
Total Protein: 7.4 g/dL (ref 6.5–8.1)

## 2022-10-10 LAB — CBC
HCT: 24.3 % — ABNORMAL LOW (ref 39.0–52.0)
HCT: 25.2 % — ABNORMAL LOW (ref 39.0–52.0)
Hemoglobin: 7.6 g/dL — ABNORMAL LOW (ref 13.0–17.0)
Hemoglobin: 7.9 g/dL — ABNORMAL LOW (ref 13.0–17.0)
MCH: 27.7 pg (ref 26.0–34.0)
MCH: 27.7 pg (ref 26.0–34.0)
MCHC: 31.3 g/dL (ref 30.0–36.0)
MCHC: 31.3 g/dL (ref 30.0–36.0)
MCV: 88.7 fL (ref 80.0–100.0)
MCV: 88.4 fL (ref 80.0–100.0)
Platelets: 462 10*3/uL — ABNORMAL HIGH (ref 150–400)
Platelets: 430 10*3/uL — ABNORMAL HIGH (ref 150–400)
RBC: 2.74 MIL/uL — ABNORMAL LOW (ref 4.22–5.81)
RBC: 2.85 MIL/uL — ABNORMAL LOW (ref 4.22–5.81)
RDW: 22.7 % — ABNORMAL HIGH (ref 11.5–15.5)
RDW: 22.7 % — ABNORMAL HIGH (ref 11.5–15.5)
WBC: 6.6 10*3/uL (ref 4.0–10.5)
WBC: 5.3 10*3/uL (ref 4.0–10.5)
nRBC: 0 % (ref 0.0–0.2)
nRBC: 0 % (ref 0.0–0.2)

## 2022-10-10 LAB — LACTIC ACID, PLASMA: Lactic Acid, Venous: 1.6 mmol/L (ref 0.5–1.9)

## 2022-10-10 LAB — PHOSPHORUS: Phosphorus: 4.5 mg/dL (ref 2.5–4.6)

## 2022-10-10 LAB — SEDIMENTATION RATE: Sed Rate: 70 mm/h — ABNORMAL HIGH (ref 0–16)

## 2022-10-10 LAB — MAGNESIUM
Magnesium: 0.9 mg/dL — CL (ref 1.7–2.4)
Magnesium: 1.3 mg/dL — ABNORMAL LOW (ref 1.7–2.4)

## 2022-10-10 MED ORDER — METOPROLOL TARTRATE 5 MG/5ML IV SOLN
5.0000 mg | Freq: Once | INTRAVENOUS | Status: AC
  Administered 2022-10-10: 5 mg via INTRAVENOUS
  Filled 2022-10-10: qty 5

## 2022-10-10 MED ORDER — BOOST / RESOURCE BREEZE PO LIQD CUSTOM
1.0000 | Freq: Two times a day (BID) | ORAL | Status: DC
  Administered 2022-10-10 – 2022-10-11 (×3): 1 via ORAL

## 2022-10-10 MED ORDER — MAGNESIUM SULFATE 2 GM/50ML IV SOLN
2.0000 g | Freq: Once | INTRAVENOUS | Status: AC
  Administered 2022-10-10: 2 g via INTRAVENOUS
  Filled 2022-10-10: qty 50

## 2022-10-10 MED ORDER — POLYETHYLENE GLYCOL 3350 17 G PO PACK
17.0000 g | PACK | Freq: Every day | ORAL | Status: DC
  Administered 2022-10-10: 17 g via ORAL
  Filled 2022-10-10 (×3): qty 1

## 2022-10-10 MED ORDER — SODIUM CHLORIDE 0.9 % IV BOLUS
1000.0000 mL | Freq: Once | INTRAVENOUS | Status: AC
  Administered 2022-10-10: 1000 mL via INTRAVENOUS

## 2022-10-10 NOTE — Progress Notes (Addendum)
   10/09/22 2348  Assess: MEWS Score  Temp 97.8 F (36.6 C)  BP 100/80  Pulse Rate (!) 167  Resp 18  SpO2 100 %  O2 Device Room Air  Assess: MEWS Score  MEWS Temp 0  MEWS Systolic 1  MEWS Pulse 3  MEWS RR 0  MEWS LOC 0  MEWS Score 4  MEWS Score Color Red  Assess: if the MEWS score is Yellow or Red  Were vital signs accurate and taken at a resting state? Yes  Does the patient meet 2 or more of the SIRS criteria? No  Does the patient have a confirmed or suspected source of infection? No  Treat  MEWS Interventions Considered administering scheduled or prn medications/treatments as ordered  Take Vital Signs  Increase Vital Sign Frequency  Red: Q1hr x2, continue Q4hrs until patient remains green for 12hrs  Escalate  MEWS: Escalate Red: Discuss with charge nurse and notify provider. Consider notifying RRT. If remains red for 2 hours consider need for higher level of care  Notify: Charge Nurse/RN  Name of Charge Nurse/RN Notified [place], RN  Provider Notification  Provider Name/Title Dr. Janalyn Shy  Date Provider Notified 10/09/22  Time Provider Notified 2354  Method of Notification  (secure)  Notification Reason Change in status  Provider response At bedside  Date of Provider Response 10/10/22  Time of Provider Response 0002  Assess: SIRS CRITERIA  SIRS Temperature  0  SIRS Pulse 1  SIRS Respirations  0  SIRS WBC 0  SIRS Score Sum  1

## 2022-10-10 NOTE — Significant Event (Addendum)
  Around 11:50 PM patient reported sustained supraventricular tachycardia heart rate 167.  Blood pressure low to 90/60.  Patient has been initially admitted for alcohol withdrawal and acute on chronic pancreatitis. -Giving patient Lopressor 5 mg once and 1 L of NS bolus.  Also given Ativan 2 mg once. -Bedside EKG showed sinus tachycardia heart rate to 160.  Ordering bedside cardiac monitor and continue to monitor after giving the Lopressor. -Checking stat BMP, CBC and lactic acid.

## 2022-10-10 NOTE — Progress Notes (Addendum)
PROGRESS NOTE    Samuel Blevins  ZOX:[id number] DOB: 1966/02/23 DOA: 10/08/2022 PCP: [name], MD    Brief Narrative:   Patient is a 57 year old male with past medical history of alcohol abuse, recurrent pancreatitis, recurrent ascites, recent biliary stent placements, HTN who was recently discharged from the hospital on 10/02/2022 after being treated for SBP, recurrent VRE positive ascites (amyloid positive) and ongoing abdominal pain.  GI was concerned for pancreatic duct disruption.  The patient was transferred to [facility] on 7/23 and 8/6 for stenting for pancreatic duct disruption.  During that hospitalization, patient had 5 separate paracentesis done.  Postprocedure patient's CT abdomen showed pneumoperitoneum as well as several diffuse loculated regions in the abdomen concerning for abscess.  Dr. Corliss Parish GI at [facility] confirmed that pneumoperitoneum was expected given their approach through the gastric wall done to place stent.  CT done 24/25 showed abscess that was drained by IR.  His abscess grew Proteus species.  He was treated inpatient with IV antibiotics.  He was also C. difficile positive.  The patient was  then discharged on prophylactic vancomycin.  Since the discharge from the hospital, patient continues to have generalized abdominal pain as well as shortness of breath.     In the ED, patient was afebrile.  Had mild thrombocytosis with lipase of 210.   CT abdomen pelvis shows near complete resolution of prior large pseudocyst posterior to the stomach. Mild residual fluid/gas persists. Interval removal of left mid abdominal and right lateral abdominal surgical drains. New/residual 2.9 cm fluid collection inferior to the right liver. Sequela of prior/chronic pancreatitis with indwelling pancreatic ductal stent.  Assessment and plan.  Abdominal pain secondary to acute on chronic alcoholic pancreatitis Chronic pain. Complicated previous history.  Continues to drink at home.   Extensive counseling done.  CT scan with improvement in findings.  Continue conservative treatment including  IV fluids analgesics antiemetics.  Start the patient on clears today.  Ammonia was 34 on presentation.  INR of 1.1.  Will gradually advance diet when tolerated.  Continue IV fluids with KCl at 150 mL/h.  Continue thiamine, folic acid, Atarax.  States that he has been taking Creon as outpatient.  He was advised to quit alcohol and will likely benefit from follow-up with pain management as outpatient if he continues to have pain.  He however does not have a primary care physician at this time.  Will consult TOC.  History of recurrent C. difficile colitis.  Patient has not had had further bowel movements while in the hospital.  Has been empirically started on oral vancomycin..  DC enteric precautions.  Will discontinue vancomycin since patient is not on antibiotics or has had any diarrhea.  Alcoholic gastritis.  Continue Protonix IV twice daily.  Recurrent VRE SBP.  Patient without ascites.  This is less likely.     Chronic alcohol abuse -CIWA protocol initiated, continue Thiamine, folate, MVI.   Hypokalemia Continue IV potassium replacement with IV fluids.  Check levels in AM..  Severe hypomagnesemia.  Magnesium of 0.9 this morning.  Has received 4 g of IV magnesium sulfate.  Will continue to replenish as necessary.  Check magnesium level in AM.   Essential hypertension Continue Coreg.  Blood pressure is stable at this time.   Protein-calorie malnutrition, severe Body mass index is 22.27 kg/m.  admission.  Albumin level of 2.7.  Consult dietitian    Tobacco abuse Continue nicotine patch.     DVT prophylaxis: heparin injection 5,000 Units  Start: 10/09/22 1400   Code Status:     Code Status: Full Code  Disposition: Home likely in 1 to 2 days when able to tolerate p.o. okay.  Patient would likely benefit from pain management as outpatient.  Status is: Inpatient  Remains  inpatient appropriate because: Acute pancreatitis, IV fluids, pending clinical improvement severe hypomagnesemia..   Family Communication: None at bedside  Consultants:  None  Procedures:  None  Antimicrobials:  Oral vancomycin  Anti-infectives (From admission, onward)    Start     Dose/Rate Route Frequency Ordered Stop   10/09/22 1000  vancomycin (VANCOCIN) capsule 125 mg  Status:  Discontinued        125 mg Oral 4 times daily 10/09/22 0552 10/09/22 0553   10/09/22 0630  vancomycin (VANCOCIN) capsule 125 mg        125 mg Oral 3 times daily before meals & bedtime 10/09/22 0553 10/19/22 0759      Subjective: Today, patient was seen and examined at bedside.  Complains of pain but no nausea vomiting.  Has not eaten yet.  Will try clears.  Has not had a bowel movement since admission.  States that he does not have a primary care provider.  Objective: Vitals:   10/10/22 0506 10/10/22 0650 10/10/22 0716 10/10/22 1149  BP: (!) 129/90 (!) 141/95 (!) 154/93 127/81  Pulse: 96 96 98 (!) 105  Resp:  17 18 18   Temp: 99 F (37.2 C) 97.6 F (36.4 C) 98.3 F (36.8 C) 98.2 F (36.8 C)  TempSrc: Oral Oral Oral Oral  SpO2: 100% 100% 100% 100%  Weight:      Height:        Intake/Output Summary (Last 24 hours) at 10/10/2022 1344 Last data filed at 10/10/2022 1224 Gross per 24 hour  Intake 3083.53 ml  Output 1925 ml  Net 1158.53 ml   Filed Weights   10/08/22 1626  Weight: 68.4 kg    Physical Examination: Body mass index is 22.27 kg/m.   General:  Average built, not in obvious distress, not in obvious distress, alert awake and Communicative, HENT:   No scleral pallor or icterus noted. Oral mucosa is moist.  Chest:  Clear breath sounds.  No crackles or wheezes.  CVS: S1 &S2 heard. No murmur.  Regular rate and rhythm. Abdomen: Soft, tenderness over the epigastric region,, nondistended.  Bowel sounds are heard.   Extremities: No cyanosis, clubbing or edema.  Psych: Alert, awake  and oriented, normal mood CNS:  No cranial nerve deficits.  Power equal in all extremities.   Skin: Warm and dry.  No rashes noted.  Data Reviewed:   CBC: Recent Labs  Lab 10/08/22 1645 10/08/22 2337 10/09/22 1024 10/10/22 0128 10/10/22 0759  WBC 5.0 4.2 4.4 6.6 5.3  NEUTROABS  --  1.8 1.7  --   --   HGB 8.6* 8.1* 8.0* 7.6* 7.9*  HCT 28.2* 25.4* 25.5* 24.3* 25.2*  MCV 89.0 88.5 86.7 88.7 88.4  PLT 507* 488* 459* 462* 430*    Basic Metabolic Panel: Recent Labs  Lab 10/08/22 1645 10/08/22 2337 10/09/22 1024 10/10/22 0128 10/10/22 0759  NA 135 134* 138 140 139  K 3.3* 3.4* 3.6 3.9 4.2  CL 101 102 106 110 110  CO2 20* 23 21* 20* 20*  GLUCOSE 102* 103* 88 74 69*  BUN <5* <5* <5* <5* <5*  CREATININE 0.64 0.62 0.74 0.76 0.64  CALCIUM 8.9 8.5* 8.5* 8.2* 8.5*  MG  --  1.2*  --  0.9* 1.3*  PHOS  --  4.3  --  4.5  --     Liver Function Tests: Recent Labs  Lab 10/08/22 1645 10/08/22 2337 10/10/22 0128 10/10/22 0759  AST 36 30 32 31  ALT 15 12 14 15   ALKPHOS 100 87 84 97  BILITOT 0.3 0.7 0.3 1.1  PROT 8.3* 7.8 7.1 7.4  ALBUMIN 2.7* 2.4* 2.1* 2.3*     Radiology Studies: CT ABDOMEN PELVIS W CONTRAST  Result Date: 10/08/2022 CLINICAL DATA:  Abdominal pain EXAM: CT ABDOMEN AND PELVIS WITH CONTRAST TECHNIQUE: Multidetector CT imaging of the abdomen and pelvis was performed using the standard protocol following bolus administration of intravenous contrast. RADIATION DOSE REDUCTION: This exam was performed according to the departmental dose-optimization program which includes automated exposure control, adjustment of the mA and/or kV according to patient size and/or use of iterative reconstruction technique. CONTRAST:  75mL OMNIPAQUE IOHEXOL 350 MG/ML SOLN COMPARISON:  09/25/2022 FINDINGS: Lower chest: Mild dependent atelectasis. Hepatobiliary: Liver is within normal limits. Layering small gallstones in the gallbladder fundus (series 3/image 33), without associated  inflammatory changes. Pancreas: Parenchymal calcifications in the pancreatic head/uncinate process, reflecting sequela of prior/chronic pancreatitis. Indwelling pancreatic duct stent. Prior large pseudocyst along the posterior aspect of the stomach is significantly improved, now with a residual 2.9 x 6.2 cm fluid and gas collection (series 3/image 19), predominantly gas. Spleen: Within normal limits. Adrenals/Urinary Tract: Adrenal glands are within normal limits. Kidneys are within normal limits.  No hydronephrosis. Bladder is unremarkable. Stomach/Bowel: Stomach is within normal limits. No evidence of bowel obstruction. Appendix is not discretely visualized. No colonic wall thickening or inflammatory changes. Vascular/Lymphatic: No evidence of abdominal aortic aneurysm. Atherosclerotic calcifications of the abdominal aorta and branch vessels, although vessels remain patent. No suspicious abdominopelvic lymphadenopathy. Reproductive: Enlargement of the central gland of the prostate which indents the base of the bladder, suggesting BPH. Other: Trace perihepatic fluid. Associated 2.9 x 1.8 cm walled-off fluid collection inferior to the right liver (series 3/image 37). Prior pigtail surgical drain has been removed. Additional left mid abdominal pigtail surgical drain has been removed, without residual fluid collection. Musculoskeletal: Degenerative changes of the lower lumbar spine. IMPRESSION: Near complete resolution of prior large pseudocyst posterior to the stomach. Mild residual fluid/gas persists, as above. Interval removal of left mid abdominal and right lateral abdominal surgical drains. New/residual 2.9 cm fluid collection inferior to the right liver. Sequela of prior/chronic pancreatitis with indwelling pancreatic ductal stent. Cholelithiasis, without associated inflammatory changes. Electronically Signed   By: Charline Bills M.D.   On: 10/08/2022 21:23      LOS: 2 days   Joycelyn Das, MD [facility] Available via Epic secure chat 7am-7pm After these hours, please refer to coverage provider listed on [email or web address] 10/10/2022, 1:44 PM

## 2022-10-10 NOTE — Evaluation (Signed)
Physical Therapy Evaluation Patient Details Name: Gus Dicks MRN: [id number] DOB: 07/19/1965 Today's Date: 10/10/2022  History of Present Illness  Patient is 57 y.o. male admitted for ETOH withdrawal and acute on chronic pancreatitis. PMH significant for ETOH abuse, HTN, thrombocytopenia, unhoused, recurrent pancreatitis, recurrent ascites, recent biliary stent placements, who was recently discharged from the hospital on 10/02/2022 after being treated for SBP, recurrent VRE positive ascites (amyloid positive) and ongoing abdominal pain.   Clinical Impression  Braylen Harbottle is 57 y.o. male admitted with above HPI and diagnosis. Patient is currently limited by functional impairments below (see PT problem list). Patient currently unhoused and residing at the [facility] shelter and is mod ind with Rollator for mobility at baseline. Currently he requires CGA/supervision for transfers and gait and is close to baseline. Patient will benefit from continued skilled PT interventions to address impairments and progress independence with mobility, anticipate pt will progress mobility well. Acute PT will follow and progress as able.         If plan is discharge home, recommend the following: Assist for transportation   Can travel by private vehicle   Yes    Equipment Recommendations None recommended by PT  Recommendations for Other Services       Functional Status Assessment Patient has had a recent decline in their functional status and demonstrates the ability to make significant improvements in function in a reasonable and predictable amount of time.     Precautions / Restrictions Precautions Precautions: Fall Restrictions Weight Bearing Restrictions: No      Mobility  Bed Mobility Overal bed mobility: Needs Assistance Bed Mobility: Supine to Sit     Supine to sit: Supervision, HOB elevated     General bed mobility comments: sup for safety, pt using bed features     Transfers Overall transfer level: Needs assistance Equipment used: Rollator (4 wheels), None Transfers: Sit to/from Stand Sit to Stand: Supervision           General transfer comment: sup for safety, pt using hands to power up and control lower to sit    Ambulation/Gait Ambulation/Gait assistance: Contact guard assist Gait Distance (Feet): 150 Feet Assistive device: Rollator (4 wheels) Gait Pattern/deviations: Step-through pattern, Decreased stride length, Drifts right/left Gait velocity: decr/fair     General Gait Details: pt maintained safe proximity to Rollator with gait, no LOB or buckling noted throughout, cues for avoiding obstacles.  Stairs            Wheelchair Mobility     Tilt Bed    Modified Rankin (Stroke Patients Only)       Balance Overall balance assessment: Needs assistance Sitting-balance support: Feet supported, No upper extremity supported Sitting balance-Leahy Scale: Good     Standing balance support: Bilateral upper extremity supported, During functional activity Standing balance-Leahy Scale: Poor Standing balance comment: Pt needs UE support for balance                             Pertinent Vitals/Pain Pain Assessment Pain Assessment: Faces Faces Pain Scale: Hurts a little bit Pain Location: generalized Pain Descriptors / Indicators: Discomfort Pain Intervention(s): Limited activity within patient's tolerance, Monitored during session, Repositioned, Patient requesting pain meds-RN notified    Home Living Family/patient expects to be discharged to:: Shelter/Homeless                   Additional Comments: Was staying a [employer], plans to return  at discharge. Has rollator , stays on first level and has elevator access if needing to go upstairs    Prior Function Prior Level of Function : Needs assist             Mobility Comments: ambulatory with rollator at baseline, but reports recent weakness  with falls ADLs Comments: modified independent     Extremity/Trunk Assessment   Upper Extremity Assessment Upper Extremity Assessment: Defer to OT evaluation    Lower Extremity Assessment Lower Extremity Assessment: Generalized weakness    Cervical / Trunk Assessment Cervical / Trunk Assessment: Kyphotic  Communication   Communication Communication: No apparent difficulties Cueing Techniques: Verbal cues  Cognition Arousal: Alert Behavior During Therapy: Flat affect Overall Cognitive Status: No family/caregiver present to determine baseline cognitive functioning Area of Impairment: Safety/judgement, Following commands, Attention, Awareness                   Current Attention Level: Sustained   Following Commands: Follows one step commands with increased time, Follows one step commands consistently, Follows multi-step commands inconsistently Safety/Judgement: Decreased awareness of safety, Decreased awareness of deficits Awareness: Emergent, Intellectual Problem Solving: Slow processing, Difficulty sequencing, Requires verbal cues          General Comments      Exercises     Assessment/Plan    PT Assessment Patient needs continued PT services  PT Problem List Decreased strength;Decreased balance;Decreased cognition;Pain;Decreased mobility;Decreased activity tolerance       PT Treatment Interventions Gait training;Functional mobility training;Balance training;Patient/family education;Therapeutic activities;Therapeutic exercise    PT Goals (Current goals can be found in the Care Plan section)  Acute Rehab PT Goals Patient Stated Goal: decrease pain PT Goal Formulation: With patient Time For Goal Achievement: 10/24/22 Potential to Achieve Goals: Good    Frequency Min 1X/week     Co-evaluation               AM-PAC PT "6 Clicks" Mobility  Outcome Measure Help needed turning from your back to your side while in a flat bed without using  bedrails?: None Help needed moving from lying on your back to sitting on the side of a flat bed without using bedrails?: None Help needed moving to and from a bed to a chair (including a wheelchair)?: A Little Help needed standing up from a chair using your arms (e.g., wheelchair or bedside chair)?: A Little Help needed to walk in hospital room?: A Little Help needed climbing 3-5 steps with a railing? : A Little 6 Click Score: 20    End of Session Equipment Utilized During Treatment: Gait belt Activity Tolerance: Patient tolerated treatment well Patient left: in bed;with call bell/phone within reach;with nursing/sitter in room Nurse Communication: Mobility status PT Visit Diagnosis: Difficulty in walking, not elsewhere classified (R26.2);Muscle weakness (generalized) (M62.81)    Time: 6644-0347 PT Time Calculation (min) (ACUTE ONLY): 24 min   Charges:   PT Evaluation $PT Eval Low Complexity: 1 Low PT Treatments $Gait Training: 8-22 mins PT General Charges $$ ACUTE PT VISIT: 1 Visit         Wynn Maudlin, DPT Acute Rehabilitation Services Office [phone]  10/10/22 11:44 AM

## 2022-10-10 NOTE — Progress Notes (Signed)
Resources provided in the AVS.

## 2022-10-10 NOTE — Plan of Care (Signed)

## 2022-10-10 NOTE — Evaluation (Signed)
Occupational Therapy Evaluation Patient Details Name: Samuel Blevins MRN: [id number] DOB: 1965-12-24 Today's Date: 10/10/2022   History of Present Illness Patient is 57 y.o. male admitted for ETOH withdrawal and acute on chronic pancreatitis. PMH significant for ETOH abuse, HTN, thrombocytopenia, unhoused, recurrent pancreatitis, recurrent ascites, recent biliary stent placements, who was recently discharged from the hospital on 10/02/2022 after being treated for SBP, recurrent VRE positive ascites (amyloid positive) and ongoing abdominal pain.   Clinical Impression   PTA, pt reports he was living at [facility]. Pt requiring supervision for BADL this session. Pt using his rollator for OOB mobility but able to rise to standing without contact with rollator. Pt with some difficulty with problem solving and STM, but suspect this is baseline due to medical/social history. Pt to benefit from skilled OT services acutely, but do not anticipate need for follow up OT after discharge at this time.       If plan is discharge home, recommend the following: Assistance with cooking/housework;Assist for transportation;Help with stairs or ramp for entrance    Functional Status Assessment  Patient has had a recent decline in their functional status and demonstrates the ability to make significant improvements in function in a reasonable and predictable amount of time.  Equipment Recommendations  None recommended by OT    Recommendations for Other Services       Precautions / Restrictions Precautions Precautions: Fall Restrictions Weight Bearing Restrictions: No      Mobility Bed Mobility Overal bed mobility: Needs Assistance Bed Mobility: Sit to Supine       Sit to supine: Supervision   General bed mobility comments: OOB on arrival with RN making bed    Transfers Overall transfer level: Needs assistance Equipment used: Rollator (4 wheels), None Transfers: Sit to/from  Stand Sit to Stand: Supervision           General transfer comment: sup for safety, pt using hands to power up and control lower to sit      Balance Overall balance assessment: Needs assistance Sitting-balance support: Feet supported, No upper extremity supported Sitting balance-Leahy Scale: Good     Standing balance support: Bilateral upper extremity supported, During functional activity Standing balance-Leahy Scale: Poor Standing balance comment: Pt needs UE support for balance                           ADL either performed or assessed with clinical judgement   ADL Overall ADL's : Needs assistance/impaired     Grooming: Wash/dry hands;Wash/dry face;Set up;Supervision/safety;Standing Grooming Details (indicate cue type and reason): at sink Upper Body Bathing: Set up;Sitting Upper Body Bathing Details (indicate cue type and reason): simulated Lower Body Bathing: Sit to/from stand;Contact guard assist Lower Body Bathing Details (indicate cue type and reason): simulated Upper Body Dressing : Set up;Sitting Upper Body Dressing Details (indicate cue type and reason): simulated Lower Body Dressing: Sit to/from stand;Contact guard assist Lower Body Dressing Details (indicate cue type and reason): able to reach feet sitting EOB Toilet Transfer: Supervision/safety;Ambulation;Rollator (4 wheels)           Functional mobility during ADLs: Supervision/safety;Rollator (4 wheels) General ADL Comments: Poor problem solving and memory but given social history (ETOH), likely baseline     Vision Baseline Vision/History: 0 No visual deficits Ability to See in Adequate Light: 0 Adequate Patient Visual Report: No change from baseline Additional Comments: PT asking OT to bring reading glasses to session of 1.0 strength.  Provided to pt and pt reporting helpful     Perception Perception: Within Functional Limits       Praxis         Pertinent Vitals/Pain Pain  Assessment Pain Assessment: Faces Faces Pain Scale: Hurts a little bit Pain Location: generalized Pain Descriptors / Indicators: Discomfort Pain Intervention(s): Limited activity within patient's tolerance, Monitored during session     Extremity/Trunk Assessment Upper Extremity Assessment Upper Extremity Assessment: Generalized weakness   Lower Extremity Assessment Lower Extremity Assessment: Defer to PT evaluation   Cervical / Trunk Assessment Cervical / Trunk Assessment: Kyphotic   Communication Communication Communication: No apparent difficulties Cueing Techniques: Verbal cues   Cognition Arousal: Alert Behavior During Therapy: Flat affect Overall Cognitive Status: No family/caregiver present to determine baseline cognitive functioning Area of Impairment: Safety/judgement, Following commands, Attention, Awareness, Memory, Problem solving                   Current Attention Level: Sustained Memory: Decreased short-term memory Following Commands: Follows one step commands with increased time, Follows one step commands consistently, Follows multi-step commands inconsistently Safety/Judgement: Decreased awareness of safety, Decreased awareness of deficits Awareness: Emergent, Intellectual Problem Solving: Slow processing, Difficulty sequencing, Requires verbal cues General Comments: needing min cues for safety with rollator. Poor delayed memory recall and problem solving during basic cognitive assessment     General Comments  VSS    Exercises     Shoulder Instructions      Home Living Family/patient expects to be discharged to:: Shelter/Homeless                                 Additional Comments: Was staying a [employer], plans to return at discharge. Has rollator , stays on first level and has elevator access if needing to go upstairs. Per pt report, shower is handicap accessible with seat      Prior Functioning/Environment Prior Level  of Function : Needs assist             Mobility Comments: ambulatory with rollator at baseline, but reports recent weakness with falls ADLs Comments: modified independent-- "manages as best he can"        OT Problem List: Decreased strength;Decreased activity tolerance;Impaired balance (sitting and/or standing);Decreased safety awareness;Pain;Decreased knowledge of use of DME or AE      OT Treatment/Interventions: Self-care/ADL training;Therapeutic activities;Patient/family education;Balance training;DME and/or AE instruction    OT Goals(Current goals can be found in the care plan section) Acute Rehab OT Goals Patient Stated Goal: to get better OT Goal Formulation: With patient Time For Goal Achievement: 10/24/22 Potential to Achieve Goals: Fair ADL Goals Pt Will Perform Grooming: with modified independence;standing Pt Will Perform Lower Body Dressing: with modified independence;sit to/from stand Pt Will Transfer to Toilet: with modified independence;ambulating;regular height toilet Additional ADL Goal #1: Pt will identify and implement 3+ fall prevention strategies.  OT Frequency: Min 1X/week    Co-evaluation              AM-PAC OT "6 Clicks" Daily Activity     Outcome Measure Help from another person eating meals?: None Help from another person taking care of personal grooming?: A Little Help from another person toileting, which includes using toliet, bedpan, or urinal?: A Little Help from another person bathing (including washing, rinsing, drying)?: A Little Help from another person to put on and taking off regular upper body clothing?: A Little Help from another  person to put on and taking off regular lower body clothing?: A Little 6 Click Score: 19   End of Session Equipment Utilized During Treatment: Rolling walker (2 wheels) Nurse Communication: Mobility status  Activity Tolerance: Patient tolerated treatment well Patient left: with call bell/phone within  reach;in bed  OT Visit Diagnosis: Unsteadiness on feet (R26.81);Muscle weakness (generalized) (M62.81);History of falling (Z91.81);Pain;Other symptoms and signs involving cognitive function                Time: 1018-1040 OT Time Calculation (min): 22 min Charges:  OT General Charges $OT Visit: 1 Visit OT Evaluation $OT Eval Moderate Complexity: 1 Mod  [name] Deis, OTR/L [facility] Acute Rehabilitation Office: [phone]   Myrla Halsted 10/10/2022, 1:20 PM

## 2022-10-10 NOTE — Progress Notes (Signed)
The charge nurse notified me that the patient's blood pressure was low despite rechecking multiple times. Upon my arrival in the room, patient stated he was having trouble breathing and beads of sweat were visible on his forehead. BP was checked manually and it improved but patient's HR still sustained greater than 160. Provider on call was notified and Dr. Janalyn Shy arrived at the bedside. EKG, one time dose of metoprolol IV and 2 mg ativan IV was given. A 1,000 mL NS bolus is running at this time. Patient is now on telemetry and HR is at 102. Other vital signs are now stable and at this time patient is sleeping and in no apparent distress.

## 2022-10-10 NOTE — Progress Notes (Signed)
Transition of Care [facility]) - Inpatient Brief Assessment   Patient Details  Name: Samuel Blevins MRN: [id number] Date of Birth: Aug 02, 1965  [facility] CM/SW Contact:    Janae Bridgeman, RN Phone Number: 10/10/2022, 4:07 PM   Clinical Narrative: Patient admitted to the hospital for Acute on chronic Pancreatitis.  The patient remains housed at the [facility] and plans to return to the shelter.  Patient continues to drink 2-3 "airplane bottles of liquor" throughout the week and did not specify how often.  The patient states that he does not want to follow up regarding Substance abuse counseling at [facility].  Patient was agreeable to review the other options for OP Substance abuse counseling - resources included in the AVS.  Patient states that he normally sees Dr. [name] at the [facility].  I sent a message to Dr. [name] for patient to follow up at the clinic at the shelter.  Heide Spark, NP at the shelter is aware and will make sure Dr. [name] follows up with the patient at the shelter for PCP follow up.  Patient will need assistance from [facility] team for taxi back to [employer].   Transition of Care Asessment: Insurance and Status: (P) Insurance coverage has been reviewed Patient has primary care physician: (P) Yes (Patient requests to follow up at [facility] clinic) Home environment has been reviewed: (P) Housed at the [facility] Prior level of function:: (P) Independent Prior/Current Home Services: (P) No current home services Social Determinants of Health Reivew: (P) SDOH reviewed needs interventions Readmission risk has been reviewed: (P) Yes Transition of care needs: (P) transition of care needs identified, TOC will continue to follow

## 2022-10-11 DIAGNOSIS — K861 Other chronic pancreatitis: Secondary | ICD-10-CM | POA: Diagnosis not present

## 2022-10-11 DIAGNOSIS — K859 Acute pancreatitis without necrosis or infection, unspecified: Secondary | ICD-10-CM | POA: Diagnosis not present

## 2022-10-11 LAB — CBC
HCT: 26.2 % — ABNORMAL LOW (ref 39.0–52.0)
Hemoglobin: 8.2 g/dL — ABNORMAL LOW (ref 13.0–17.0)
MCH: 28.4 pg (ref 26.0–34.0)
MCHC: 31.3 g/dL (ref 30.0–36.0)
MCV: 90.7 fL (ref 80.0–100.0)
Platelets: 382 10*3/uL (ref 150–400)
RBC: 2.89 MIL/uL — ABNORMAL LOW (ref 4.22–5.81)
RDW: 22.7 % — ABNORMAL HIGH (ref 11.5–15.5)
WBC: 5.2 10*3/uL (ref 4.0–10.5)
nRBC: 0 % (ref 0.0–0.2)

## 2022-10-11 LAB — COMPREHENSIVE METABOLIC PANEL
ALT: 12 U/L (ref 0–44)
AST: 28 U/L (ref 15–41)
Albumin: 2.2 g/dL — ABNORMAL LOW (ref 3.5–5.0)
Alkaline Phosphatase: 89 U/L (ref 38–126)
Anion gap: 11 (ref 5–15)
BUN: <5 mg/dL — ABNORMAL LOW (ref 6–20)
CO2: 20 mmol/L — ABNORMAL LOW (ref 22–32)
Calcium: 8.3 mg/dL — ABNORMAL LOW (ref 8.9–10.3)
Chloride: 104 mmol/L (ref 98–111)
Creatinine, Ser: 0.53 mg/dL — ABNORMAL LOW (ref 0.61–1.24)
GFR, Estimated: >60 mL/min (ref >60)
Glucose, Bld: 89 mg/dL (ref 70–99)
Potassium: 3.9 mmol/L (ref 3.5–5.1)
Sodium: 135 mmol/L (ref 135–145)
Total Bilirubin: 0.5 mg/dL (ref 0.3–1.2)
Total Protein: 7.2 g/dL (ref 6.5–8.1)

## 2022-10-11 LAB — MAGNESIUM
Magnesium: 1.1 mg/dL — ABNORMAL LOW (ref 1.7–2.4)
Magnesium: 1.4 mg/dL — ABNORMAL LOW (ref 1.7–2.4)

## 2022-10-11 LAB — URINE CULTURE

## 2022-10-11 MED ORDER — MAGNESIUM SULFATE 2 GM/50ML IV SOLN
2.0000 g | INTRAVENOUS | Status: AC
  Administered 2022-10-11 (×2): 2 g via INTRAVENOUS
  Filled 2022-10-11 (×2): qty 50

## 2022-10-11 MED ORDER — MAGNESIUM SULFATE 2 GM/50ML IV SOLN
2.0000 g | Freq: Once | INTRAVENOUS | Status: AC
  Administered 2022-10-11: 2 g via INTRAVENOUS
  Filled 2022-10-11: qty 50

## 2022-10-11 NOTE — Progress Notes (Signed)
Progress Note   Patient: Samuel Blevins YQM:[id number] DOB: [birth year] DOA: 10/08/2022     3 DOS: the patient was seen and examined on 10/11/2022     Subjective: Patient seen and examined at bedside this morning Admits to abdominal pain however improving He tells me he is only able to eat on account of abdominal pain Denies nausea vomiting Electrolytes including magnesium noted to be low which is being repleted    Brief Narrative:    From HPI "patient is a 57 year old male with past medical history of alcohol abuse, recurrent pancreatitis, recurrent ascites, recent biliary stent placements, HTN who was recently discharged from the hospital on 10/02/2022 after being treated for SBP, recurrent VRE positive ascites (amyloid positive) and ongoing abdominal pain.  GI was concerned for pancreatic duct disruption.  The patient was transferred to [facility] on 7/23 and 8/6 for stenting for pancreatic duct disruption.  During that hospitalization, patient had 5 separate paracentesis done.  Postprocedure patient's CT abdomen showed pneumoperitoneum as well as several diffuse loculated regions in the abdomen concerning for abscess.  Dr. Corliss Parish GI at [facility] confirmed that pneumoperitoneum was expected given their approach through the gastric wall done to place stent.  CT done 24/25 showed abscess that was drained by IR.  His abscess grew Proteus species.  He was treated inpatient with IV antibiotics.  He was also C. difficile positive.  The patient was  then discharged on prophylactic vancomycin.  Since the discharge from the hospital, patient continues to have generalized abdominal pain as well as shortness of breath.      In the ED, patient was afebrile.  Had mild thrombocytosis with lipase of 210.   CT abdomen pelvis shows near complete resolution of prior large pseudocyst posterior to the stomach. Mild residual fluid/gas persists. Interval removal of left mid abdominal and right lateral abdominal surgical  drains. New/residual 2.9 cm fluid collection inferior to the right liver. Sequela of prior/chronic pancreatitis with indwelling pancreatic ductal stent.  "   Assessment and plan.   Abdominal pain secondary to acute on chronic alcoholic pancreatitis Chronic pain. Patient still unable to tolerate feeding on account of abdominal pain Continue current pain management Continue current IV fluid We will advance diet as tolerated  Continue thiamine, folic acid, Atarax.  States that he has been taking Creon as outpatient.  He was advised to quit alcohol and will likely benefit from follow-up with pain management as outpatient if he continues to have pain.  He however does not have a primary care physician at this time.   Hulda Marin of care manager consulted we appreciate input   History of recurrent C. difficile colitis.   Vancomycin was discontinued yesterday since patient is not on antibiotics or has had any diarrhea. Patient has not had any further bowel movement Contact precaution discontinued   Alcoholic gastritis.  Continue PPI IV twice daily   Recurrent VRE SBP.  Patient without ascites.  This is less likely.     Chronic alcohol abuse -Continue to monitor CIWA protocol initiated, continue Thiamine, folate, MVI.   Hypokalemia Continue IV potassium replacement with IV fluids.  Check levels in AM..   Severe hypomagnesemia.-Improving Continue repleting magnesium levels closely   Essential hypertension Continue Coreg Continue to monitor blood pressure closely   Protein-calorie malnutrition, severe Body mass index is 22.27 kg/m.  admission.  Albumin level of 2.7.   Dietitian consulted   Tobacco abuse Continue nicotine patch     DVT prophylaxis: heparin injection 5,000  Units Start: 10/09/22 1400     Code Status:     Code Status: Full Code   Disposition: Home likely in 1 to 2 days when able to tolerate p.o. okay.  Patient would likely benefit from pain management as outpatient.    Status is: Inpatient   Remains inpatient appropriate because: Acute pancreatitis, IV fluids, pending clinical improvement severe hypomagnesemia..     Consultants:  None   Procedures:  None   Antimicrobials:  Oral vancomycin    Physical Examination: Body mass index is 22.27 kg/m.    General: Middle-age male in mild distress on account of abdominal pain HENT:   No scleral pallor or icterus noted. Oral mucosa is moist.  Chest:  Clear breath sounds.  No crackles or wheezes.  CVS: S1 &S2 heard. No murmur.  Regular rate and rhythm. Abdomen: Tenderness in the mid abdomen as well as epigastric region  Extremity: No clubbing or edema.  Psych: Alert, awake and oriented, normal mood CNS:  No cranial nerve deficits.  Power equal in all extremities.   Skin: Warm and dry.  No rashes noted.  Vitals:   10/10/22 2031 10/11/22 0130 10/11/22 0450 10/11/22 0809  BP: (!) 146/88 134/89 (!) 142/86 (!) 153/93  Pulse: (!) 103 98 99 86  Resp: 18 18 18 18   Temp: 98.9 F (37.2 C) 98.3 F (36.8 C)  (!) 97.5 F (36.4 C)  TempSrc:      SpO2: 100% 100% 100% 100%  Weight:      Height:        Data Reviewed: Have reviewed patient's CBC as well as BMP results as shown below    Latest Ref Rng & Units 10/11/2022    8:17 AM 10/10/2022    7:59 AM 10/10/2022    1:28 AM  CBC  WBC 4.0 - 10.5 K/uL 5.2  5.3  6.6   Hemoglobin 13.0 - 17.0 g/dL 8.2  7.9  7.6   Hematocrit 39.0 - 52.0 % 26.2  25.2  24.3   Platelets 150 - 400 K/uL 382  430  462        Latest Ref Rng & Units 10/11/2022    8:17 AM 10/10/2022    7:59 AM 10/10/2022    1:28 AM  BMP  Glucose 70 - 99 mg/dL 89  69  74   BUN 6 - 20 mg/dL <5  <5  <5   Creatinine 0.61 - 1.24 mg/dL 1.47  8.29  5.62   Sodium 135 - 145 mmol/L 135  139  140   Potassium 3.5 - 5.1 mmol/L 3.9  4.2  3.9   Chloride 98 - 111 mmol/L 104  110  110   CO2 22 - 32 mmol/L 20  20  20    Calcium 8.9 - 10.3 mg/dL 8.3  8.5  8.2       Family Communication: None present at bedside  this morning   Author: Loyce Dys, MD 10/11/2022 3:48 PM  For on call review [email or web address].

## 2022-10-11 NOTE — Plan of Care (Signed)
  Problem: Education: Goal: Knowledge of General Education information will improve Description: Including pain rating scale, medication(s)/side effects and non-pharmacologic comfort measures 10/11/2022 1605 by Earlie Raveling, RN Outcome: Progressing 10/11/2022 1557 by Earlie Raveling, RN Outcome: Progressing   Problem: Health Behavior/Discharge Planning: Goal: Ability to manage health-related needs will improve 10/11/2022 1605 by Earlie Raveling, RN Outcome: Progressing 10/11/2022 1557 by Earlie Raveling, RN Outcome: Progressing   Problem: Clinical Measurements: Goal: Ability to maintain clinical measurements within normal limits will improve 10/11/2022 1605 by Earlie Raveling, RN Outcome: Progressing 10/11/2022 1557 by Earlie Raveling, RN Outcome: Progressing Goal: Will remain free from infection 10/11/2022 1605 by Earlie Raveling, RN Outcome: Progressing 10/11/2022 1557 by Earlie Raveling, RN Outcome: Progressing Goal: Diagnostic test results will improve 10/11/2022 1605 by Earlie Raveling, RN Outcome: Progressing 10/11/2022 1557 by Earlie Raveling, RN Outcome: Progressing Goal: Respiratory complications will improve 10/11/2022 1605 by Earlie Raveling, RN Outcome: Progressing 10/11/2022 1557 by Earlie Raveling, RN Outcome: Progressing Goal: Cardiovascular complication will be avoided 10/11/2022 1605 by Earlie Raveling, RN Outcome: Progressing 10/11/2022 1557 by Earlie Raveling, RN Outcome: Progressing   Problem: Activity: Goal: Risk for activity intolerance will decrease 10/11/2022 1605 by Earlie Raveling, RN Outcome: Progressing 10/11/2022 1557 by Earlie Raveling, RN Outcome: Progressing   Problem: Nutrition: Goal: Adequate nutrition will be maintained 10/11/2022 1605 by Earlie Raveling, RN Outcome: Progressing 10/11/2022 1557 by Earlie Raveling, RN Outcome: Progressing   Problem: Coping: Goal: Level of anxiety will decrease 10/11/2022 1605 by Earlie Raveling, RN Outcome:  Progressing 10/11/2022 1557 by Earlie Raveling, RN Outcome: Progressing   Problem: Elimination: Goal: Will not experience complications related to bowel motility 10/11/2022 1605 by Earlie Raveling, RN Outcome: Progressing 10/11/2022 1557 by Earlie Raveling, RN Outcome: Progressing Goal: Will not experience complications related to urinary retention 10/11/2022 1605 by Earlie Raveling, RN Outcome: Progressing 10/11/2022 1557 by Earlie Raveling, RN Outcome: Progressing   Problem: Pain Managment: Goal: General experience of comfort will improve 10/11/2022 1605 by Earlie Raveling, RN Outcome: Progressing 10/11/2022 1557 by Earlie Raveling, RN Outcome: Progressing   Problem: Safety: Goal: Ability to remain free from injury will improve 10/11/2022 1605 by Earlie Raveling, RN Outcome: Progressing 10/11/2022 1557 by Earlie Raveling, RN Outcome: Progressing   Problem: Skin Integrity: Goal: Risk for impaired skin integrity will decrease 10/11/2022 1605 by Earlie Raveling, RN Outcome: Progressing 10/11/2022 1557 by Earlie Raveling, RN Outcome: Progressing

## 2022-10-11 NOTE — Progress Notes (Signed)
Occupational Therapy Treatment Patient Details Name: Samuel Blevins MRN: [id number] DOB: 08-14-1965 Today's Date: 10/11/2022   History of present illness Patient is 57 y.o. male admitted for ETOH withdrawal and acute on chronic pancreatitis. PMH significant for ETOH abuse, HTN, thrombocytopenia, unhoused, recurrent pancreatitis, recurrent ascites, recent biliary stent placements, who was recently discharged from the hospital on 10/02/2022 after being treated for SBP, recurrent VRE positive ascites (amyloid positive) and ongoing abdominal pain.   OT comments  Pt progressing toward established OT goals. Seen today for further more functional assessment of cognition and for safety with use of rollator. Pt with greater safety awareness this session and good use of RW. One cue for applying both breaks. Needing mod cues for path finding in hall. Pt observed to self manage catheter, self-initiate changing underpants, and ask for phone in corner out of sight at end of session. Pt able to perform simple money management task with increased time. Will continue to follow acutely to optimize independence, and no OT follow up recommended as believe pt near baseline cognition.       If plan is discharge home, recommend the following:  Assistance with cooking/housework;Assist for transportation;Help with stairs or ramp for entrance   Equipment Recommendations  None recommended by OT    Recommendations for Other Services      Precautions / Restrictions Precautions Precautions: Fall Restrictions Weight Bearing Restrictions: No       Mobility Bed Mobility Overal bed mobility: Needs Assistance Bed Mobility: Supine to Sit, Sit to Supine     Supine to sit: Supervision, HOB elevated Sit to supine: Supervision        Transfers Overall transfer level: Needs assistance Equipment used: Rollator (4 wheels), None Transfers: Sit to/from Stand Sit to Stand: Supervision     Step pivot transfers:  Supervision     General transfer comment: for safety     Balance Overall balance assessment: Needs assistance Sitting-balance support: Feet supported, No upper extremity supported Sitting balance-Leahy Scale: Good     Standing balance support: Bilateral upper extremity supported, During functional activity Standing balance-Leahy Scale: Poor Standing balance comment: Pt needs UE support for balance                           ADL either performed or assessed with clinical judgement   ADL Overall ADL's : Needs assistance/impaired     Grooming: Wash/dry hands;Wash/dry face;Set up;Supervision/safety;Standing               Lower Body Dressing: Supervision/safety;Sit to/from stand Lower Body Dressing Details (indicate cue type and reason): doffing old brief and donning new. Able to unplug purewich for functional mobility and plug back in once returned to room without cues or assist. Toilet Transfer: Supervision/safety;Ambulation;Rollator (4 wheels) Toilet Transfer Details (indicate cue type and reason): in room         Functional mobility during ADLs: Supervision/safety;Rollator (4 wheels) General ADL Comments: Poor problem solving and memory but given social history (ETOH), likely baseline    Extremity/Trunk Assessment Upper Extremity Assessment Upper Extremity Assessment: Generalized weakness   Lower Extremity Assessment Lower Extremity Assessment: Defer to PT evaluation        Vision       Perception Perception Perception: Within Functional Limits   Praxis      Cognition Arousal: Alert Behavior During Therapy: Flat affect Overall Cognitive Status: No family/caregiver present to determine baseline cognitive functioning Area of Impairment: Awareness, Problem solving, Following commands  Following Commands: Follows one step commands with increased time, Follows one step commands consistently, Follows multi-step commands  inconsistently   Awareness: Emergent Problem Solving: Slow processing, Difficulty sequencing, Requires verbal cues General Comments: Pt continues with poor memory and easily becoming confused with way finding in the hallway. Pt requriing increased time and explanation for money management scenario but once grasping, able to perform very basic money management without difficulty. Reporting his wife (whom he does not live with?) does his shopping to get groceries, etc. Reports he does not take medication at home. Able to remember individuals from community as he passed them in the hall (not preesnt to visit patient)        Exercises      Shoulder Instructions       General Comments VSS    Pertinent Vitals/ Pain       Pain Assessment Pain Assessment: Faces Faces Pain Scale: Hurts a little bit Pain Location: stomach Pain Descriptors / Indicators: Discomfort Pain Intervention(s): Limited activity within patient's tolerance, Monitored during session  Home Living                                          Prior Functioning/Environment              Frequency  Min 1X/week        Progress Toward Goals  OT Goals(current goals can now be found in the care plan section)  Progress towards OT goals: Progressing toward goals  Acute Rehab OT Goals Patient Stated Goal: get stomach fixed OT Goal Formulation: With patient Time For Goal Achievement: 10/24/22 Potential to Achieve Goals: Fair ADL Goals Pt Will Perform Grooming: with modified independence;standing Pt Will Perform Lower Body Dressing: with modified independence;sit to/from stand Pt Will Transfer to Toilet: with modified independence;ambulating;regular height toilet Additional ADL Goal #1: Pt will identify and implement 3+ fall prevention strategies.  Plan      Co-evaluation                 AM-PAC OT "6 Clicks" Daily Activity     Outcome Measure   Help from another person eating meals?:  None Help from another person taking care of personal grooming?: A Little Help from another person toileting, which includes using toliet, bedpan, or urinal?: A Little Help from another person bathing (including washing, rinsing, drying)?: A Little Help from another person to put on and taking off regular upper body clothing?: A Little Help from another person to put on and taking off regular lower body clothing?: A Little 6 Click Score: 19    End of Session Equipment Utilized During Treatment: Rolling walker (2 wheels)  OT Visit Diagnosis: Unsteadiness on feet (R26.81);Muscle weakness (generalized) (M62.81);History of falling (Z91.81);Pain;Other symptoms and signs involving cognitive function   Activity Tolerance Patient tolerated treatment well   Patient Left with call bell/phone within reach;in bed   Nurse Communication Mobility status        Time: 1610-9604 OT Time Calculation (min): 19 min  Charges: OT General Charges $OT Visit: 1 Visit OT Treatments $Self Care/Home Management : 8-22 mins  [name] Deis, OTR/L [facility] Acute [facility] Office: [phone]   Myrla Halsted 10/11/2022, 4:35 PM

## 2022-10-11 NOTE — Plan of Care (Signed)

## 2022-10-12 ENCOUNTER — Other Ambulatory Visit (HOSPITAL_COMMUNITY): Payer: Self-pay

## 2022-10-12 DIAGNOSIS — K861 Other chronic pancreatitis: Secondary | ICD-10-CM | POA: Diagnosis not present

## 2022-10-12 DIAGNOSIS — K859 Acute pancreatitis without necrosis or infection, unspecified: Secondary | ICD-10-CM | POA: Diagnosis not present

## 2022-10-12 LAB — CBC
HCT: 26.4 % — ABNORMAL LOW (ref 39.0–52.0)
Hemoglobin: 8.3 g/dL — ABNORMAL LOW (ref 13.0–17.0)
MCH: 27.8 pg (ref 26.0–34.0)
MCHC: 31.4 g/dL (ref 30.0–36.0)
MCV: 88.3 fL (ref 80.0–100.0)
Platelets: 385 10*3/uL (ref 150–400)
RBC: 2.99 MIL/uL — ABNORMAL LOW (ref 4.22–5.81)
RDW: 22.5 % — ABNORMAL HIGH (ref 11.5–15.5)
WBC: 4.2 10*3/uL (ref 4.0–10.5)
nRBC: 0 % (ref 0.0–0.2)

## 2022-10-12 LAB — COMPREHENSIVE METABOLIC PANEL
ALT: 13 U/L (ref 0–44)
AST: 33 U/L (ref 15–41)
Albumin: 2.4 g/dL — ABNORMAL LOW (ref 3.5–5.0)
Alkaline Phosphatase: 95 U/L (ref 38–126)
Anion gap: 10 (ref 5–15)
BUN: <5 mg/dL — ABNORMAL LOW (ref 6–20)
CO2: 22 mmol/L (ref 22–32)
Calcium: 8.5 mg/dL — ABNORMAL LOW (ref 8.9–10.3)
Chloride: 105 mmol/L (ref 98–111)
Creatinine, Ser: 0.66 mg/dL (ref 0.61–1.24)
GFR, Estimated: >60 mL/min (ref >60)
Glucose, Bld: 90 mg/dL (ref 70–99)
Potassium: 4.3 mmol/L (ref 3.5–5.1)
Sodium: 137 mmol/L (ref 135–145)
Total Bilirubin: 0.4 mg/dL (ref 0.3–1.2)
Total Protein: 7.4 g/dL (ref 6.5–8.1)

## 2022-10-12 MED ORDER — ACETAMINOPHEN 325 MG PO TABS
650.0000 mg | ORAL_TABLET | Freq: Four times a day (QID) | ORAL | 0 refills | Status: AC | PRN
  Filled 2022-10-12: qty 100, 13d supply, fill #0

## 2022-10-12 MED ORDER — MAGNESIUM SULFATE 2 GM/50ML IV SOLN
2.0000 g | INTRAVENOUS | Status: AC
  Administered 2022-10-12 (×2): 2 g via INTRAVENOUS
  Filled 2022-10-12 (×2): qty 50

## 2022-10-12 NOTE — Discharge Summary (Signed)
Physician Discharge Summary   Patient: Samuel Blevins MRN: [id number] DOB: [birth year]  Admit date:     10/08/2022  Discharge date: 10/12/22  Discharge Physician: Loyce Dys   PCP: [name], MD     Discharge Diagnoses:  Abdominal pain secondary to acute on chronic alcoholic pancreatitis Chronic pain. History of recurrent C. difficile colitis.   Alcoholic gastritis.  Recurrent VRE SBP.  Chronic alcohol abuse Hypokalemia Severe hypomagnesemia.-Improving Essential hypertension Protein-calorie malnutrition, severe Tobacco abuse  Hospital Course:  Patient is a 57 year old male with past medical history of alcohol abuse, recurrent pancreatitis, recurrent ascites, recent biliary stent placements, HTN who was recently discharged from the hospital on 10/02/2022 after being treated for SBP, recurrent VRE positive ascites (amyloid positive) and ongoing abdominal pain.  GI was concerned for pancreatic duct disruption.  The patient was transferred to [facility] on 7/23 and 8/6 for stenting for pancreatic duct disruption.  During that hospitalization, patient had 5 separate paracentesis done.  Postprocedure patient's CT abdomen showed pneumoperitoneum as well as several diffuse loculated regions in the abdomen concerning for abscess.  Dr. Corliss Parish GI at [facility] confirmed that pneumoperitoneum was expected given their approach through the gastric wall done to place stent.  CT done 24/25 showed abscess that was drained by IR.  His abscess grew Proteus species.  He was treated inpatient with IV antibiotics.  He was also C. difficile positive.  The patient was  then discharged on prophylactic vancomycin.  Since the discharge from the hospital, patient continues to have generalized abdominal pain as well as shortness of breath.      In the ED, patient was afebrile.  Had mild thrombocytosis with lipase of 210.   CT abdomen pelvis shows near complete resolution of prior large pseudocyst posterior to the  stomach. Mild residual fluid/gas persists. Interval removal of left mid abdominal and right lateral abdominal surgical drains. New/residual 2.9 cm fluid collection inferior to the right liver. Sequela of prior/chronic pancreatitis with indwelling pancreatic ductal stent.  He was admitted and managed for alcoholic pancreatitis with improvement in abdominal pain.  Patient received IV fluid and pain medication and currently able to tolerate feed with no pain.  Patient is therefore being discharged to follow-up with outpatient physicians     Consultants: None Procedures performed: None Disposition: Home Diet recommendation:  Cardiac diet DISCHARGE MEDICATION: Allergies as of 10/12/2022   No Known Allergies      Medication List     STOP taking these medications    traMADol 50 MG tablet Commonly known as: ULTRAM   vancomycin 125 MG capsule Commonly known as: VANCOCIN       TAKE these medications    acetaminophen 325 MG tablet Commonly known as: TYLENOL Take 2 tablets (650 mg total) by mouth every 6 (six) hours as needed for mild pain (or Fever >/= 101).   carvedilol 3.125 MG tablet Commonly known as: COREG Take 1 tablet (3.125 mg total) by mouth 2 (two) times daily with a meal. What changed: when to take this   Creon 12000-38000 units Cpep capsule Generic drug: lipase/protease/amylase Take 1 capsule (12,000 Units total) by mouth 3 (three) times daily before meals.   FeroSul 325 (65 Fe) MG tablet Generic drug: ferrous sulfate Take 1 tablet ( 325 mg) by mouth daily.   folic acid 1 MG tablet Commonly known as: FOLVITE Take 1 tablet (1 mg total) by mouth daily.   hydrOXYzine 25 MG tablet Commonly known as: ATARAX Take 1 tablet (  25 mg total) by mouth at bedtime as needed for anxiety.   magnesium oxide 400 MG tablet Commonly known as: MAG-OX Take 1 tablet (400 mg total) by mouth in the morning and at bedtime.   multivitamin with minerals Tabs tablet Take 1 tablet by  mouth daily.   nicotine 14 mg/24hr patch Commonly known as: NICODERM CQ - dosed in mg/24 hours Place 1 patch (14 mg total) onto the skin daily.   ondansetron 8 MG tablet Commonly known as: ZOFRAN Take 1 tablet (8 mg total) by mouth every 8 (eight) hours as needed for nausea or vomiting.   pantoprazole 40 MG tablet Commonly known as: PROTONIX Take 1 tablet (40 mg total) by mouth 2 (two) times daily before a meal.   polyethylene glycol 17 g packet Commonly known as: MIRALAX / GLYCOLAX Take 17 g by mouth daily as needed for mild constipation or moderate constipation.   sucralfate 1 g tablet Commonly known as: CARAFATE Take 1 tablet by mouth 4 times daily with meals and bedtime   thiamine 100 MG tablet Commonly known as: VITAMIN B1 Take 1 tablet (100 mg total) by mouth daily.        Follow-up Information     [name], MD. Schedule an appointment as soon as possible for a visit.   Specialty: Pulmonary Disease Why: Please call [facility] for hospital follow up. Contact information: [address] [place] [address] [phone]                Discharge Exam: Ceasar Mons Weights   10/08/22 1626 10/12/22 0506  Weight: 68.4 kg 68.4 kg   General: Middle-age male in no distress HENT:   No scleral pallor or icterus noted. Oral mucosa is moist.  Chest:  Clear breath sounds.  No crackles or wheezes.  CVS: S1 &S2 heard. No murmur.  Regular rate and rhythm. Abdomen: Nontender Extremity: No clubbing or edema.  Psych: Alert, awake and oriented, normal mood CNS:  No cranial nerve deficits.  Power equal in all extremities.   Skin: Warm and dry.  No rashes noted.  Condition at discharge: good    Discharge time spent:  36 minutes.  Signed: Loyce Dys, MD [facility] 10/12/2022

## 2022-10-12 NOTE — Plan of Care (Signed)
  Problem: Education: Goal: Knowledge of General Education information will improve Description: Including pain rating scale, medication(s)/side effects and non-pharmacologic comfort measures 10/12/2022 0551 by Jaclyn Shaggy, RN Outcome: Progressing 10/12/2022 0551 by Jaclyn Shaggy, RN Outcome: Progressing   Problem: Health Behavior/Discharge Planning: Goal: Ability to manage health-related needs will improve 10/12/2022 0551 by Jaclyn Shaggy, RN Outcome: Progressing 10/12/2022 0551 by Jaclyn Shaggy, RN Outcome: Progressing   Problem: Clinical Measurements: Goal: Ability to maintain clinical measurements within normal limits will improve Outcome: Progressing Goal: Will remain free from infection Outcome: Progressing Goal: Diagnostic test results will improve Outcome: Progressing Goal: Respiratory complications will improve Outcome: Progressing Goal: Cardiovascular complication will be avoided Outcome: Progressing   Problem: Activity: Goal: Risk for activity intolerance will decrease Outcome: Progressing   Problem: Nutrition: Goal: Adequate nutrition will be maintained Outcome: Progressing   Problem: Coping: Goal: Level of anxiety will decrease Outcome: Progressing   Problem: Elimination: Goal: Will not experience complications related to bowel motility Outcome: Progressing Goal: Will not experience complications related to urinary retention Outcome: Progressing   Problem: Pain Managment: Goal: General experience of comfort will improve Outcome: Progressing   Problem: Safety: Goal: Ability to remain free from injury will improve Outcome: Progressing   Problem: Skin Integrity: Goal: Risk for impaired skin integrity will decrease Outcome: Progressing

## 2022-10-15 ENCOUNTER — Telehealth: Payer: Self-pay

## 2022-10-15 NOTE — Transitions of Care (Post Inpatient/ED Visit) (Signed)
   10/15/2022  Name: Samuel Blevins MRN: [id number] DOB: 01/17/1966  Today's TOC FU Call Status: Today's TOC FU Call Status:: Unsuccessful Call (1st Attempt) Unsuccessful Call (1st Attempt) Date: 10/15/22  Attempted to reach the patient regarding the most recent Inpatient/ED visit.  Follow Up Plan: Additional outreach attempts will be made to reach the patient to complete the Transitions of Care (Post Inpatient/ED visit) call.   Signature  Robyne Peers, RN

## 2022-10-16 ENCOUNTER — Telehealth: Payer: Self-pay

## 2022-10-16 NOTE — Transitions of Care (Post Inpatient/ED Visit) (Signed)
   10/16/2022  Name: Samuel Blevins MRN: [id number] DOB: 1966/01/22  Today's TOC FU Call Status: Today's TOC FU Call Status:: Unsuccessful Call (2nd Attempt) Unsuccessful Call (1st Attempt) Date: 10/15/22 Unsuccessful Call (2nd Attempt) Date: 10/16/22  Attempted to reach the patient regarding the most recent Inpatient/ED visit.  Follow Up Plan: Additional outreach attempts will be made to reach the patient to complete the Transitions of Care (Post Inpatient/ED visit) call.   Signature Robyne Peers, RN

## 2022-10-17 ENCOUNTER — Telehealth: Payer: Self-pay

## 2022-10-17 NOTE — Transitions of Care (Post Inpatient/ED Visit) (Signed)
   10/17/2022  Name: Khareem Klomp MRN: [id number] DOB: [birth year]  Today's TOC FU Call Status: Today's TOC FU Call Status:: Unsuccessful Call (3rd Attempt) Unsuccessful Call (1st Attempt) Date: 10/15/22 Unsuccessful Call (2nd Attempt) Date: 10/16/22 Unsuccessful Call (3rd Attempt) Date: 10/18/22  Attempted to reach the patient regarding the most recent Inpatient/ED visit.  Follow Up Plan: No further outreach attempts will be made at this time. We have been unable to contact the patient.  Message sent to Waynetta Sandy, RN/[facility] notifying her that I have not been able to reach the patient and to please let me know if she sees him and I can schedule his follow up appointment   Signature  Robyne Peers, RN

## 2022-10-22 ENCOUNTER — Telehealth: Payer: Self-pay

## 2022-10-22 NOTE — Telephone Encounter (Signed)
I received a message from Waynetta Sandy, RN/ CNP stating the patient has a new phone number: [phone].  I tried calling that number to schedule a follow up appointment and the voicemail was full.

## 2022-10-30 ENCOUNTER — Encounter: Payer: Self-pay | Admitting: Critical Care Medicine

## 2022-10-31 NOTE — Progress Notes (Signed)
I briefly [facility] the [facility]  He could not /would not come into the shelter clinic area inside  I was lying down in lateral position wanting pain meds.  He has still been drinking vodka daily.  Has has multiple ED hosp visits for pancreatis ETOH induced and now wanting opioids  He has been detoxed multiple times  I have tried to do all I can for this patient to no avail  After I left him he immediately was able to stand up and walk with his rollaltor  I will try to get him into the office or f/u again at shelter  He was just dc from hosp 10/12/22 yet again

## 2022-11-06 ENCOUNTER — Encounter: Payer: Self-pay | Admitting: Critical Care Medicine

## 2022-11-06 NOTE — Telephone Encounter (Signed)
I spoke to the patient and was able to schedule him with Dr [name] 11/13/2022 @ 0930.  Message sent to Waynetta Sandy, RN with appointment information and request for transportation assistance for the patient to the appointment

## 2022-11-06 NOTE — Progress Notes (Signed)
Pt seen at shelter clinic  Recent Adm in Aug reviewed Has appt with me next weekl Despite getting the patient his meds he never takes them  Cannot take tramadol  Plan dc all meds except Creon, tylenol as needed, pantoprazole  See next week toc visit

## 2022-11-13 ENCOUNTER — Ambulatory Visit: Payer: MEDICAID | Admitting: Critical Care Medicine

## 2022-11-13 ENCOUNTER — Other Ambulatory Visit: Payer: Self-pay | Admitting: Critical Care Medicine

## 2022-11-13 ENCOUNTER — Other Ambulatory Visit: Payer: Self-pay

## 2022-11-13 ENCOUNTER — Encounter: Payer: Self-pay | Admitting: Physician Assistant

## 2022-11-13 MED ORDER — GABAPENTIN 100 MG PO CAPS
100.0000 mg | ORAL_CAPSULE | Freq: Three times a day (TID) | ORAL | 3 refills | Status: AC
  Filled 2022-11-13: qty 90, 30d supply, fill #0

## 2022-11-13 NOTE — Progress Notes (Signed)
Pt seren by Dr [name].   He has a tube in his abd, says he can feel it on the lateral lower ribs, was advised it is in the pancreas and not palpable.  The reasons for the tube were explained to him.   He is still having diarrhea, no N&V, he has bloody and black tarry stools.  He drinks all the alcohol he can get. Has had 2 airplane bottles today.    He says he has a lot of pain. He says the gabapentin helped a little. He was given new rx for this.   He was not taking meds because he felt he was on too many pills.   PW reduced meds, just left him on a few things. He has plenty of Tylenol, Creon, pantoprazole, hydroxyzine     11/13/2022    2:51 PM 10/12/2022   11:39 AM 10/12/2022    8:01 AM  Vitals with BMI  Weight 148 lbs    Systolic 143 116 841  Diastolic 81 91 90  Pulse 84 111 85    No other issues.   Theodore Demark, PA-C 11/13/2022 2:53 PM

## 2022-11-13 NOTE — Progress Notes (Deleted)
Established Patient Office Visit  Subjective   Patient ID: Samuel Blevins, male    DOB: 1966/02/12  Age: 57 y.o. MRN: [id number]  Cc post hosp fu  08/02/21 This is a 57 year old male who has had previous history of pancreatic pseudocyst pancreatitis and severe alcoholism.  He has been drinking a pint of vodka daily.  He is at the [employer].  We brought him into the health and wellness office today to get labs and further examine him.  He had abdominal pain in the mid epigastric area that is worsening.  He has been vomiting coffee-ground material with occasional small amounts of blood.  He has had some dark melanotic stools.  The patient has been hospitalized in March of this year but never really had gastroenterology follow-up.  I tried to engage him at the shelter at least twice in the last 2 months he finally engaged with me and came to the office today for examination and emergency labs.  In the process of the stat labs being drawn we had his medications refilled that he had not been taking.  The labs came back positive for a lipase of greater than 1200 and have been in the low-grade 100 range.  Hemoglobin is about the same 7-1/2 platelet count is actually up his liver function actually is remarkably not any worse.  Because he has had a pancreatic pseudocyst I believe he needs CT imaging urgently and we cannot accomplish this as an outpatient particular given the fact he lives in a homeless shelter  We contacted the emergency room triage nurse at [facility], ER and told him we will be sending him over and actually he is going over to the emergency room from the homeless shelter.  He is stable at this time is not hemodynamically unstable and is not septic.  It would be best if he could have gastroenterology see him during this admission and perform an upper endoscopy as well.  Note the patient needs a rollator to walk with his alcoholism he has had significant alcoholic  neuropathy.    12/21 Patient seen for transition of care post hospital visit he had been seen previously at the homeless shelter for such same visit.  He has history of severe alcoholism recently fell had a partial fracture of C7 nondisplaced.  He has alcoholic pancreatitis and as well steatosis also has gastritis.  He has chronic abdominal pain he has bowel movements that are frequent with some blood in the stool and occasionally has emesis with blood in the stool.  Comes into the office very shaky has not had any drink today he has been shaking and jerking some.  He is not adherent with all his medications as listed.  Below is a copy of the discharge summary to which he left AMA. Physician Discharge Summary   Samuel Blevins WNU:[id number] DOB: [birth year] DOA: 01/30/2022   PCP: [name], MD   Admit date: 01/30/2022 Discharge date: 02/01/2022 LEFT AMA     Admitted From: home Disposition:  home Discharging physician: Lewie Chamber, MD   Recommendations for Outpatient Follow-up:  1. Patient instructed to follow-up with neurosurgery regarding c-collar      Hospital Course: [name] is a 58 yo male with PMH alcohol misuse disorder, chronic pancreatitis, pancreatic pseudocyst, chronic pancreatic insufficiency, hypertension, chronic anemia and thrombocytopenia, homelessness who presented to the ED after a fall.  He had also reported an episode of bloody emesis and dark stools prior  to admission.  He has had prior EGDs during similar evaluations for hematemesis. He does continue to consume alcohol and resides at [facility]. Hemoglobin is stable and at baseline on admission.  GI was consulted for further evaluation.   GI bleed workup was placed on hold in setting of his ensuing alcohol withdrawal. He required escalation up to Precedex drip along with phenobarb taper. Despite use with Ativan and Haldol as well, he continued to have ongoing withdrawal symptoms. He was deemed to have  capacity and was insistent on leaving AMA multiple times. Risks and benefits were discussed prior to patient leaving and he still insisted on leaving.  He was given contact information for [place] [facility] for following up regarding his suspected C7 fracture.  Cervical collar in place at time of discharge. Despite trying to encourage patient to remain in the hospital, he still chose to leave AMA.   Assessment and Plan: * GI bleed - Prior history noted similar with hematemesis.  He also describes dark stools and has not been able to have outpt colonoscopy due to homelessness - continues to consume alcohol - last EGD June 2023 noted with peptic duodenitis and reactive gastropathy with mild chronic gastritis -Currently hemoglobin stable.  Continue trending; no further episodes since admission - GI following, appreciate assistance.  Tentative plan for more what sounds like inpatient EGD.  Patient may also benefit from colonoscopy if having difficulty obtaining outpatient; decision deferred to GI   Cervical spine fracture (HCC) - s/p mechanical fall also with contribution from alcohol intoxication -CT C-spine shows "possible subtle nondisplaced fracture through the superior articular facet of C7 on the right" -Neurosurgery recommending c-collar and outpatient follow-up   Alcohol dependence syndrome (HCC) - Ethanol level 360 on admission -Patient endorsed that his last drink was Tuesday evening - Continue CIWA protocol -Continue thiamine, folate, multivitamin   Pancytopenia (HCC) - Again, suspected due to bone marrow suppression from chronic alcohol use   Hypokalemia - Replete as needed   Thrombocytopenia (HCC) - Likely due to bone marrow suppression from chronic alcohol use - Continue trending   Essential hypertension - Resume amlodipine   Transaminitis - Due to chronic alcohol use - Continue trending       Principal Diagnosis: GI bleed   Discharge Diagnoses:  Active  Hospital Problems   Diagnosis Date Noted  GI bleed 01/30/2022     Priority: 1.  Cervical spine fracture (HCC) 01/31/2022     Priority: 2.  Alcohol dependence syndrome (HCC) 06/21/2019     Priority: 4.  GIB (gastrointestinal bleeding) 01/31/2022  Pancytopenia (HCC) 01/31/2022  Hypokalemia    Thrombocytopenia (HCC) 05/09/2021  Transaminitis    Essential hypertension 03/17/2014    This patient is attempting to get disability has multiple medical problems that are not being well addressed and are contributing to his disability.  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   Patient is also having difficulty with ambulation needs a walker for this.  He is still drinking about 1-2 small airplane bottles of vodka daily.  Disability paperwork was received.  07/17/22 This is a 57 year old male with chronic alcohol abuse chronic pancreatitis alcoholic gastritis duodenitis portal vein thrombosis.  This patient continues to drink about 1/5-2/5 of vodka a day.  He was recently hospitalized between the 15th and 19 May as documented below he wanted to go  home but he did not get discharged AMA this time.  He was detoxed with Librium but states today he is gone back to drinking immediately after he left the hospital.  Patient has severe pain in the pancreas with this alcohol use.  GI saw the patient and recommended increasing pantoprazole to twice a day and stopping the Carafate  Below is the discharge summary Adm 5/15- 07/14/22 Discharge Diagnoses: Principal Problem:   Alcohol-induced chronic pancreatitis (HCC) Active Problems:   Nausea and vomiting in adult   Hematemesis with nausea   Essential hypertension   Hospital Course: PMH of chronic abdominal pain, recurrent pancreatitis, HTN, pancytopenia, alcohol abuse present to  the hospital with complaints of abdominal pain nausea and vomiting. There was also concern for hematemesis. Found to have acute on chronic recurrent pancreatitis. GI consulted.  Conservatively treated.  Currently signed off.  Diet advanced.  Fluids stopped.  Developed agitation on 5/17 night-5/18.   Assessment and Plan  Acute on chronic recurrent pancreatitis likely from alcohol. No gallstones seen on the CT scan. CT scan shows evidence of acute pancreatitis. Lipase level minimally elevated 116 trending up. [facility] consulted. Treated conservatively. Currently advancing diet.  Tolerating well.  Fluids stopped.  No abdominal pain. GI currently signed off.  Requesting to go home.  Contracting to avoid alcohol going forward.   Alcohol abuse Hallucination. Drinks 1 pint of vodka on a daily basis. Last drink was on 5/15. Treated with CIWA protocol as well as scheduled Librium. Had a significant agitation event on 5/18 morning. Improved significantly with subsequent CIWA score less than 10. Continue thiamine folic acid and multivitamin at discharge. As there is a very high chance of alcohol abuse recurrence with not prescribing benzos on discharge and would recommend seeking counseling outpatient.   Duodenitis GERD Questionable hematemesis Patient reports hematemesis.  H&H relatively stable. CT also shows evidence of duodenitis most likely secondary to pancreatitis. On PPI twice daily at home.  Carafate discontinued per GI recommendation.   UTI Urine shows nitrates. Currently on cefadroxil.   Hypokalemia Hypomagnesemia. Replaced. Monitor.   Chronic thrombocytopenia Secondary to alcohol abuse Monitor    Hepatic steatosis Secondary to alcohol use. Monitor   Active smoker Nicotine patch. Smokes half a pack a day.   HTN Blood pressure stable. Hold metoprolol.  Continue amlodipine.    Blood on arrival blood pressure is good 119/76 he does maintain amlodipine daily he  was to take cefadroxil antibiotic for urine infection he is yet to pick this up  They did not discharge the patient on Librium because of high risk still smoking daily  9/18 Admit date:     10/08/2022 Discharge date: 10/12/22 Discharge Physician: Loyce Dys   PCP: [name], MD        Discharge Diagnoses:   Abdominal pain secondary to acute on chronic alcoholic pancreatitis Chronic pain. History of recurrent C. difficile colitis.   Alcoholic gastritis.  Recurrent VRE SBP.  Chronic alcohol abuse Hypokalemia Severe hypomagnesemia.-Improving Essential hypertension Protein-calorie malnutrition, severe Tobacco abuse   Hospital Course:   Patient is a 57 year old male with past medical history of alcohol abuse, recurrent pancreatitis, recurrent ascites, recent biliary stent placements, HTN who was recently discharged from the hospital on 10/02/2022 after being treated for SBP, recurrent VRE positive ascites (amyloid positive) and ongoing abdominal pain.  GI was concerned for pancreatic duct disruption.  The patient was transferred to [facility] on 7/23 and 8/6 for stenting for pancreatic duct disruption.  During that  hospitalization, patient had 5 separate paracentesis done.  Postprocedure patient's CT abdomen showed pneumoperitoneum as well as several diffuse loculated regions in the abdomen concerning for abscess.  Dr. Corliss Parish GI at [facility] confirmed that pneumoperitoneum was expected given their approach through the gastric wall done to place stent.  CT done 24/25 showed abscess that was drained by IR.  His abscess grew Proteus species.  He was treated inpatient with IV antibiotics.  He was also C. difficile positive.  The patient was  then discharged on prophylactic vancomycin.  Since the discharge from the hospital, patient continues to have generalized abdominal pain as well as shortness of breath.      In the ED, patient was afebrile.  Had mild thrombocytosis with lipase of 210.   CT  abdomen pelvis shows near complete resolution of prior large pseudocyst posterior to the stomach. Mild residual fluid/gas persists. Interval removal of left mid abdominal and right lateral abdominal surgical drains. New/residual 2.9 cm fluid collection inferior to the right liver. Sequela of prior/chronic pancreatitis with indwelling pancreatic ductal stent.  He was admitted and managed for alcoholic pancreatitis with improvement in abdominal pain.  Patient received IV fluid and pain medication and currently able to tolerate feed with no pain.  Patient is therefore being discharged to follow-up with outpatient physicians       Patient Active Problem List   Diagnosis Date Noted   Abscess of abdominal cavity (HCC) 09/19/2022   Bacterial peritonitis (HCC) 09/11/2022   Peritonitis (HCC) 09/10/2022   Enteritis due to Clostridium difficile 09/09/2022   SIRS (systemic inflammatory response syndrome) (HCC) 09/06/2022   Infected ascites 09/06/2022   Anasarca 09/01/2022   Protein-calorie malnutrition, severe 08/30/2022   Alcoholic liver disease (HCC) 08/30/2022   Pancreatic duct disruption 08/30/2022   Other ascites 08/28/2022   Acute pancreatitis 08/18/2022   Pancreatitis 08/17/2022   SBP (spontaneous bacterial peritonitis) (HCC) 08/17/2022   Hyponatremia 08/17/2022   Urinary tract infection 07/17/2022   Abnormal finding on GI tract imaging 07/13/2022   Abdominal pain, epigastric 07/11/2022   Sepsis (HCC) 06/10/2022   Acute on chronic pancreatitis (HCC) 06/10/2022   Acute kidney injury (nontraumatic) (HCC) 06/10/2022   Fall 04/16/2022   Alcohol withdrawal with inpatient treatment with perceptual disturbance (HCC) 04/13/2022   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   Hypokalemia    Hypomagnesemia    Portal vein thrombosis 08/03/2021   Alcoholic pancreatitis 08/03/2021   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   Anxiety and depression 07/04/2021   Homeless  07/04/2021   Osteoarthritis 07/04/2021   Thrombocytopenia (HCC) 05/09/2021   Pancreatic insufficiency 05/08/2021   Chronic alcohol abuse 05/07/2021   Alcoholic steatohepatitis 12/07/2020   Iron deficiency anemia due to chronic blood loss 12/07/2020   Seizure (HCC) 12/04/2020   Alcohol withdrawal (HCC) 09/16/2020   Chronic pain syndrome 12/28/2019   Pseudocyst of pancreas due to acute pancreatitis 10/28/2019   Gastritis and gastroduodenitis    AVM (arteriovenous malformation) of small bowel, acquired    Transaminitis    Alcohol dependence syndrome (HCC) 06/21/2019   Chronic anemia 11/23/2015   Tobacco abuse 03/17/2014   Essential hypertension 03/17/2014   Past Medical History:  Diagnosis Date   Alcohol withdrawal syndrome with complication (HCC)    Alcoholism (HCC)    Elevated AST (SGOT)    Gastrointestinal hemorrhage    Homeless    Hypertension    Pancreatic insufficiency    takes Creon   Symptomatic anemia 11/23/2015   Thrombocytopenia (HCC) 05/09/2021  Past Surgical History:  Procedure Laterality Date   BIOPSY  06/23/2019   Procedure: BIOPSY;  Surgeon: Shellia Cleverly, DO;  Location: [facility] ENDOSCOPY;  Service: Gastroenterology;;   BIOPSY  12/12/2020   Procedure: BIOPSY;  Surgeon: Lynann Bologna, MD;  Location: [facility] ENDOSCOPY;  Service: Endoscopy;;   BIOPSY  08/04/2021   Procedure: BIOPSY;  Surgeon: Jenel Lucks, MD;  Location: [facility] ENDOSCOPY;  Service: Gastroenterology;;   ESOPHAGOGASTRODUODENOSCOPY N/A 08/04/2021   Procedure: ESOPHAGOGASTRODUODENOSCOPY (EGD);  Surgeon: Jenel Lucks, MD;  Location: [facility] ENDOSCOPY;  Service: Gastroenterology;  Laterality: N/A;   ESOPHAGOGASTRODUODENOSCOPY (EGD) WITH PROPOFOL N/A 06/23/2019   Procedure: ESOPHAGOGASTRODUODENOSCOPY (EGD) WITH PROPOFOL;  Surgeon: Shellia Cleverly, DO;  Location: [facility] ENDOSCOPY;  Service: Gastroenterology;  Laterality: N/A;   ESOPHAGOGASTRODUODENOSCOPY (EGD) WITH PROPOFOL N/A 12/12/2020   Procedure:  ESOPHAGOGASTRODUODENOSCOPY (EGD) WITH PROPOFOL;  Surgeon: Lynann Bologna, MD;  Location: [facility] ENDOSCOPY;  Service: Endoscopy;  Laterality: N/A;   HOT HEMOSTASIS N/A 06/23/2019   Procedure: HOT HEMOSTASIS (ARGON PLASMA COAGULATION/BICAP);  Surgeon: Shellia Cleverly, DO;  Location: [facility] ENDOSCOPY;  Service: Gastroenterology;  Laterality: N/A;   IR PARACENTESIS  08/21/2022   IR PARACENTESIS  08/26/2022   IR PARACENTESIS  08/30/2022   IR PARACENTESIS  09/02/2022   IR PARACENTESIS  09/06/2022   Social History   Tobacco Use   Smoking status: Every Day    Current packs/day: 0.50    Average packs/day: 0.5 packs/day for 30.0 years (15.0 ttl pk-yrs)    Types: Cigarettes   Smokeless tobacco: Never  Vaping Use   Vaping status: Never Used  Substance Use Topics   Alcohol use: Yes    Alcohol/week: 28.0 standard drinks of alcohol    Types: 28 Shots of liquor per week   Drug use: Never   Social History   Socioeconomic History   Marital status: Single    Spouse name: Not on file   Number of children: Not on file   Years of education: Not on file   Highest education level: Not on file  Occupational History   Not on file  Tobacco Use   Smoking status: Every Day    Current packs/day: 0.50    Average packs/day: 0.5 packs/day for 30.0 years (15.0 ttl pk-yrs)    Types: Cigarettes   Smokeless tobacco: Never  Vaping Use   Vaping status: Never Used  Substance and Sexual Activity   Alcohol use: Yes    Alcohol/week: 28.0 standard drinks of alcohol    Types: 28 Shots of liquor per week   Drug use: Never   Sexual activity: Not Currently  Other Topics Concern   Not on file  Social History Narrative   Not on file   Social Determinants of Health   Financial Resource Strain: Not on File (06/14/2021)   Received from [facility], [place]   Financial Resource Strain    Financial Resource Strain: 0  Food Insecurity: Not on file (11/04/2022)  Recent Concern: Food Insecurity - Food Insecurity Present (09/19/2022)    Hunger Vital Sign    Worried About Running Out of Food in the Last Year: Sometimes true    Ran Out of Food in the Last Year: Sometimes true  Transportation Needs: No Transportation Needs (10/09/2022)   PRAPARE - Administrator, Civil Service (Medical): No    Lack of Transportation (Non-Medical): No  Recent Concern: Transportation Needs - Unmet Transportation Needs (09/19/2022)   PRAPARE - Administrator, Civil Service (Medical): Yes  Lack of Transportation (Non-Medical): Yes  Physical Activity: Not on File (06/14/2021)   Received from [place], [place]   Physical Activity    Physical Activity: 0  Stress: Not on File (06/14/2021)   Received from [facility], [place]   Stress    Stress: 0  Social Connections: Not on File (06/14/2021)   Received from [place], [place]   Social Connections    Social Connections and Isolation: 0  Intimate Partner Violence: Not At Risk (10/09/2022)   Humiliation, Afraid, Rape, and Kick questionnaire    Fear of Current or Ex-Partner: No    Emotionally Abused: No    Physically Abused: No    Sexually Abused: No   Family Status  Relation Name Status   Neg Hx  (Not Specified)  No partnership data on file   Family History  Problem Relation Age of Onset   Diabetes Mellitus II Neg Hx    Colon cancer Neg Hx    Stomach cancer Neg Hx    Pancreatic cancer Neg Hx    No Known Allergies    Review of Systems  Constitutional:  Positive for malaise/fatigue and weight loss. Negative for chills, diaphoresis and fever.  HENT:  Positive for nosebleeds. Negative for congestion, ear discharge, ear pain, hearing loss, sore throat and tinnitus.   Eyes:  Negative for blurred vision, double vision, photophobia and discharge.  Respiratory:  Negative for cough, hemoptysis, sputum production, shortness of breath, wheezing and stridor.        No excess mucus  Cardiovascular:  Positive for leg swelling. Negative for chest pain, palpitations, orthopnea, claudication and  PND.  Gastrointestinal:  Positive for abdominal pain, blood in stool, diarrhea, heartburn, melena, nausea and vomiting. Negative for constipation.  Genitourinary:  Negative for dysuria, flank pain, frequency, hematuria and urgency.  Musculoskeletal:  Negative for back pain, falls, joint pain, myalgias and neck pain.       Gait disturbance  Skin:  Negative for itching and rash.  Neurological:  Positive for dizziness, weakness and headaches. Negative for tingling, tremors, sensory change, speech change, focal weakness, seizures and loss of consciousness.  Endo/Heme/Allergies:  Negative for environmental allergies and polydipsia. Does not bruise/bleed easily.  Psychiatric/Behavioral:  Positive for depression and memory loss. Negative for hallucinations, substance abuse and suicidal ideas. The patient is nervous/anxious. The patient does not have insomnia.   All other systems reviewed and are negative.     Objective:     There were no vitals taken for this visit. BP Readings from Last 3 Encounters:  10/12/22 (!) 116/91  10/03/22 126/82  10/02/22 138/69   Wt Readings from Last 3 Encounters:  10/12/22 150 lb 12.6 oz (68.4 kg)  09/18/22 150 lb 12.7 oz (68.4 kg)  09/18/22 150 lb 14.4 oz (68.4 kg)      Physical Exam Vitals reviewed.  Constitutional:      Appearance: Normal appearance. He is well-developed. He is ill-appearing. He is not diaphoretic.     Comments: Withdrawn affect  HENT:     Head: Normocephalic and atraumatic.     Nose: Nose normal. No nasal deformity, septal deviation, mucosal edema, congestion or rhinorrhea.     Right Sinus: No maxillary sinus tenderness or frontal sinus tenderness.     Left Sinus: No maxillary sinus tenderness or frontal sinus tenderness.     Mouth/Throat:     Mouth: Mucous membranes are dry.     Pharynx: Oropharynx is clear. No oropharyngeal exudate.  Eyes:     General: No scleral  icterus.    Conjunctiva/sclera: Conjunctivae normal.     Pupils:  Pupils are equal, round, and reactive to light.  Neck:     Thyroid: No thyromegaly.     Vascular: No carotid bruit or JVD.     Trachea: Trachea normal. No tracheal tenderness or tracheal deviation.     Comments: Neck brace in place Cardiovascular:     Rate and Rhythm: Normal rate and regular rhythm.     Chest Wall: PMI is not displaced.     Pulses: Normal pulses. No decreased pulses.     Heart sounds: Normal heart sounds, S1 normal and S2 normal. Heart sounds not distant. No murmur heard.    No systolic murmur is present.     No diastolic murmur is present.     No friction rub. No gallop. No S3 or S4 sounds.  Pulmonary:     Effort: No tachypnea, accessory muscle usage or respiratory distress.     Breath sounds: No stridor. No decreased breath sounds, wheezing, rhonchi or rales.  Chest:     Chest wall: No tenderness.  Abdominal:     General: Bowel sounds are normal. There is distension.     Palpations: Abdomen is not rigid. There is no mass.     Tenderness: There is abdominal tenderness. There is guarding. There is no right CVA tenderness, left CVA tenderness or rebound.     Hernia: No hernia is present.  Musculoskeletal:        General: Normal range of motion.     Cervical back: Normal range of motion and neck supple. No edema, erythema, rigidity or tenderness. No muscular tenderness. Normal range of motion.  Lymphadenopathy:     Head:     Right side of head: No submental or submandibular adenopathy.     Left side of head: No submental or submandibular adenopathy.     Cervical: No cervical adenopathy.  Skin:    General: Skin is warm and dry.     Coloration: Skin is not pale.     Findings: No rash.     Nails: There is no clubbing.  Neurological:     General: No focal deficit present.     Mental Status: He is alert and oriented to person, place, and time.     Sensory: Sensory deficit present.     Motor: Weakness present.     Coordination: Coordination abnormal.     Gait: Gait  abnormal.     Deep Tendon Reflexes: Reflexes abnormal.     Comments: Resting tremor  Psychiatric:        Attention and Perception: Perception normal. He is inattentive.        Mood and Affect: Mood is not anxious or depressed. Affect is tearful.        Speech: Speech normal.        Behavior: Behavior is withdrawn. Behavior is cooperative.        Thought Content: Thought content does not include homicidal or suicidal ideation. Thought content does not include homicidal or suicidal plan.        Cognition and Memory: Cognition is impaired. Memory is impaired. He exhibits impaired recent memory and impaired remote memory.        Judgment: Judgment is impulsive.      No results found for any visits on 11/13/22.   Last CBC Lab Results  Component Value Date   WBC 4.2 10/12/2022   HGB 8.3 (L) 10/12/2022   HCT 26.4 (L) 10/12/2022  MCV 88.3 10/12/2022   MCH 27.8 10/12/2022   RDW 22.5 (H) 10/12/2022   PLT 385 10/12/2022   Last metabolic panel Lab Results  Component Value Date   GLUCOSE 90 10/12/2022   NA 137 10/12/2022   K 4.3 10/12/2022   CL 105 10/12/2022   CO2 22 10/12/2022   BUN <5 (L) 10/12/2022   CREATININE 0.66 10/12/2022   EGFR 111 02/14/2022   CALCIUM 8.5 (L) 10/12/2022   PHOS 4.5 10/10/2022   PROT 7.4 10/12/2022   ALBUMIN 2.4 (L) 10/12/2022   LABGLOB 4.2 02/14/2022   AGRATIO 1.1 (L) 02/14/2022   BILITOT 0.4 10/12/2022   ALKPHOS 95 10/12/2022   AST 33 10/12/2022   ALT 13 10/12/2022   ANIONGAP 10 10/12/2022   Last lipids Lab Results  Component Value Date   CHOL 158 08/02/2021   HDL 71 08/02/2021   LDLCALC 73 08/02/2021   TRIG 97 07/12/2022   CHOLHDL 2.2 08/02/2021   Last hemoglobin A1c Lab Results  Component Value Date   HGBA1C 5.2 08/02/2021   Last thyroid functions Lab Results  Component Value Date   TSH 1.859 08/17/2022   Last vitamin D No results found for: "25OHVITD2", "25OHVITD3", "VD25OH" Last vitamin B12 and Folate Lab Results   Component Value Date   VITAMINB12 425 08/20/2022   FOLATE 28.8 05/07/2021      The 10-year ASCVD risk score (Arnett DK, et al., 2019) is: 9.1%    Assessment & Plan:   Problem List Items Addressed This Visit   None   40 minutes spent extra time needed for care coordination with case manager and the homeless shelter and medication adjustments Shan Levans, MD

## 2022-11-13 NOTE — Progress Notes (Signed)
med

## 2022-11-14 ENCOUNTER — Other Ambulatory Visit: Payer: Self-pay

## 2022-11-14 ENCOUNTER — Encounter: Payer: Self-pay | Admitting: *Deleted

## 2022-11-14 NOTE — Congregational Nurse Program (Signed)
  Dept: [phone]   Congregational Nurse Program Note  Date of Encounter: 11/14/2022  Past Medical History: Past Medical History:  Diagnosis Date   Alcohol withdrawal syndrome with complication (HCC)    Alcoholism (HCC)    Elevated AST (SGOT)    Gastrointestinal hemorrhage    Homeless    Hypertension    Pancreatic insufficiency    takes Creon   Symptomatic anemia 11/23/2015   Thrombocytopenia (HCC) 05/09/2021    Encounter Details:  CNP Questionnaire - 11/14/22 1300       Questionnaire   Ask client: Do you give verbal consent for me to treat you today? Yes    Student Assistance [employer] Nurse    Location Patient Served  [facility]    Visit Setting with Client Organization    Patient Status Unhoused    Insurance Medicaid    Insurance/Financial Assistance Referral N/A    Medication Have Medication Insecurities;Provided Medication Assistance    Medical Provider Yes    Screening Referrals Made N/A    Medical Referrals Made N/A    Medical Appointment Made N/A    Recently w/o PCP, now 1st time PCP visit completed due to CNs referral or appointment made N/A    Food Have Food Insecurities    Transportation N/A    Housing/Utilities No permanent housing    Interventions Advocate/Support;Navigate Healthcare System    Abnormal to Normal Screening Since Last CN Visit N/A    Screenings CN Performed N/A    Sent Client to Lab for: N/A    Did client attend any of the following based off CNs referral or appointments made? N/A    ED Visit Averted N/A    Life-Saving Intervention Made N/A            Picked up medication at [facility] and brought to [facility]. Found client outside shelter in parking lot. Asked client to come inside and review medications and client declined. Client requested writer put medication in medication bag behind lobby front desk. Done as requested.  Aislinn Feliz W RN CN

## 2022-11-20 ENCOUNTER — Encounter: Payer: Self-pay | Admitting: Physician Assistant

## 2022-11-20 NOTE — Progress Notes (Signed)
Pt seen by RGB  Pt has abd pain, abd diffusely tender, ++bowel sounds.   Drinks a bottle of water daily, does not drink much else.  Has had 3 airplane bottles today.   Taking gabapentin bid, Creon once a day. Was requested to take the Creon 3 x day w/ meals. Encouraged to take the Protonix as well.   Still vomiting q am, has blood in it.  Stools w/ red blood and sometimes black blood.   Asks about his tube in his belly. Was reassured that it does not need to come out, it is helping him.   Theodore Demark, PA-C 11/20/2022 5:09 PM

## 2023-03-01 ENCOUNTER — Encounter (HOSPITAL_COMMUNITY): Payer: Self-pay

## 2023-03-01 ENCOUNTER — Emergency Department (HOSPITAL_COMMUNITY): Payer: MEDICAID

## 2023-03-01 ENCOUNTER — Inpatient Hospital Stay (HOSPITAL_COMMUNITY)
Admission: EM | Admit: 2023-03-01 | Discharge: 2023-03-04 | DRG: 371 | Discharge status: Against Medical Advice | Payer: MEDICAID | Attending: Family Medicine | Admitting: Family Medicine

## 2023-03-01 ENCOUNTER — Other Ambulatory Visit: Payer: Self-pay

## 2023-03-01 DIAGNOSIS — A0471 Enterocolitis due to Clostridium difficile, recurrent: Principal | ICD-10-CM | POA: Diagnosis present

## 2023-03-01 DIAGNOSIS — K852 Alcohol induced acute pancreatitis without necrosis or infection: Secondary | ICD-10-CM | POA: Diagnosis present

## 2023-03-01 DIAGNOSIS — E861 Hypovolemia: Secondary | ICD-10-CM | POA: Diagnosis present

## 2023-03-01 DIAGNOSIS — E876 Hypokalemia: Secondary | ICD-10-CM | POA: Diagnosis present

## 2023-03-01 DIAGNOSIS — F1010 Alcohol abuse, uncomplicated: Secondary | ICD-10-CM | POA: Diagnosis present

## 2023-03-01 DIAGNOSIS — E871 Hypo-osmolality and hyponatremia: Secondary | ICD-10-CM | POA: Diagnosis present

## 2023-03-01 DIAGNOSIS — Z79899 Other long term (current) drug therapy: Secondary | ICD-10-CM

## 2023-03-01 DIAGNOSIS — F1020 Alcohol dependence, uncomplicated: Secondary | ICD-10-CM | POA: Diagnosis present

## 2023-03-01 DIAGNOSIS — E860 Dehydration: Secondary | ICD-10-CM | POA: Diagnosis present

## 2023-03-01 DIAGNOSIS — R197 Diarrhea, unspecified: Secondary | ICD-10-CM | POA: Diagnosis not present

## 2023-03-01 DIAGNOSIS — D649 Anemia, unspecified: Secondary | ICD-10-CM | POA: Diagnosis not present

## 2023-03-01 DIAGNOSIS — N179 Acute kidney failure, unspecified: Secondary | ICD-10-CM | POA: Diagnosis present

## 2023-03-01 DIAGNOSIS — K760 Fatty (change of) liver, not elsewhere classified: Secondary | ICD-10-CM | POA: Diagnosis present

## 2023-03-01 DIAGNOSIS — I10 Essential (primary) hypertension: Secondary | ICD-10-CM | POA: Diagnosis present

## 2023-03-01 DIAGNOSIS — D638 Anemia in other chronic diseases classified elsewhere: Secondary | ICD-10-CM | POA: Diagnosis present

## 2023-03-01 DIAGNOSIS — K861 Other chronic pancreatitis: Secondary | ICD-10-CM

## 2023-03-01 DIAGNOSIS — K860 Alcohol-induced chronic pancreatitis: Secondary | ICD-10-CM | POA: Diagnosis present

## 2023-03-01 DIAGNOSIS — K859 Acute pancreatitis without necrosis or infection, unspecified: Secondary | ICD-10-CM | POA: Diagnosis present

## 2023-03-01 DIAGNOSIS — F17210 Nicotine dependence, cigarettes, uncomplicated: Secondary | ICD-10-CM | POA: Diagnosis present

## 2023-03-01 DIAGNOSIS — E872 Acidosis, unspecified: Secondary | ICD-10-CM | POA: Diagnosis present

## 2023-03-01 DIAGNOSIS — K8681 Exocrine pancreatic insufficiency: Secondary | ICD-10-CM | POA: Diagnosis present

## 2023-03-01 DIAGNOSIS — E162 Hypoglycemia, unspecified: Secondary | ICD-10-CM | POA: Diagnosis present

## 2023-03-01 LAB — URINALYSIS, ROUTINE W REFLEX MICROSCOPIC
Bacteria, UA: NONE SEEN
Bilirubin Urine: NEGATIVE
Glucose, UA: NEGATIVE mg/dL
Hgb urine dipstick: NEGATIVE
Ketones, ur: 5 mg/dL — AB
Leukocytes,Ua: NEGATIVE
Nitrite: NEGATIVE
Protein, ur: 30 mg/dL — AB
Specific Gravity, Urine: >1.046 — ABNORMAL HIGH (ref 1.005–1.030)
pH: 5 (ref 5.0–8.0)

## 2023-03-01 LAB — CBC WITH DIFFERENTIAL/PLATELET
Abs Immature Granulocytes: 0.08 10*3/uL — ABNORMAL HIGH (ref 0.00–0.07)
Basophils Absolute: 0 10*3/uL (ref 0.0–0.1)
Basophils Relative: 0 %
Eosinophils Absolute: 0 10*3/uL (ref 0.0–0.5)
Eosinophils Relative: 1 %
HCT: 25.7 % — ABNORMAL LOW (ref 39.0–52.0)
Hemoglobin: 8.8 g/dL — ABNORMAL LOW (ref 13.0–17.0)
Immature Granulocytes: 1 %
Lymphocytes Relative: 18 %
Lymphs Abs: 1.4 10*3/uL (ref 0.7–4.0)
MCH: 30.2 pg (ref 26.0–34.0)
MCHC: 34.2 g/dL (ref 30.0–36.0)
MCV: 88.3 fL (ref 80.0–100.0)
Monocytes Absolute: 0.4 10*3/uL (ref 0.1–1.0)
Monocytes Relative: 5 %
Neutro Abs: 5.9 10*3/uL (ref 1.7–7.7)
Neutrophils Relative %: 75 %
Platelets: 231 10*3/uL (ref 150–400)
RBC: 2.91 MIL/uL — ABNORMAL LOW (ref 4.22–5.81)
RDW: 14.1 % (ref 11.5–15.5)
WBC: 7.8 10*3/uL (ref 4.0–10.5)
nRBC: 0 % (ref 0.0–0.2)

## 2023-03-01 LAB — LACTIC ACID, PLASMA
Lactic Acid, Venous: 4.7 mmol/L (ref 0.5–1.9)
Lactic Acid, Venous: 1.1 mmol/L (ref 0.5–1.9)

## 2023-03-01 LAB — COMPREHENSIVE METABOLIC PANEL
ALT: 16 U/L (ref 0–44)
AST: 78 U/L — ABNORMAL HIGH (ref 15–41)
Albumin: 3.2 g/dL — ABNORMAL LOW (ref 3.5–5.0)
Alkaline Phosphatase: 150 U/L — ABNORMAL HIGH (ref 38–126)
Anion gap: 19 — ABNORMAL HIGH (ref 5–15)
BUN: 24 mg/dL — ABNORMAL HIGH (ref 6–20)
CO2: 25 mmol/L (ref 22–32)
Calcium: 8.9 mg/dL (ref 8.9–10.3)
Chloride: 87 mmol/L — ABNORMAL LOW (ref 98–111)
Creatinine, Ser: 1.29 mg/dL — ABNORMAL HIGH (ref 0.61–1.24)
GFR, Estimated: >60 mL/min (ref >60)
Glucose, Bld: 86 mg/dL (ref 70–99)
Potassium: 2.4 mmol/L — CL (ref 3.5–5.1)
Sodium: 131 mmol/L — ABNORMAL LOW (ref 135–145)
Total Bilirubin: 1.1 mg/dL (ref 0.0–1.2)
Total Protein: 8.5 g/dL — ABNORMAL HIGH (ref 6.5–8.1)

## 2023-03-01 LAB — POC OCCULT BLOOD, ED: Fecal Occult Bld: NEGATIVE

## 2023-03-01 LAB — C DIFFICILE QUICK SCREEN W PCR REFLEX
C Diff antigen: POSITIVE — AB
C Diff toxin: NEGATIVE

## 2023-03-01 LAB — LIPASE, BLOOD: Lipase: 375 U/L — ABNORMAL HIGH (ref 11–51)

## 2023-03-01 LAB — MAGNESIUM: Magnesium: 1 mg/dL — ABNORMAL LOW (ref 1.7–2.4)

## 2023-03-01 LAB — CLOSTRIDIUM DIFFICILE BY PCR, REFLEXED: Toxigenic C. Difficile by PCR: POSITIVE — AB

## 2023-03-01 MED ORDER — IOHEXOL 350 MG/ML SOLN
75.0000 mL | Freq: Once | INTRAVENOUS | Status: AC | PRN
  Administered 2023-03-01: 75 mL via INTRAVENOUS

## 2023-03-01 MED ORDER — LORAZEPAM 1 MG PO TABS: 1.0000 mg | ORAL_TABLET | ORAL | Status: DC | PRN

## 2023-03-01 MED ORDER — LORAZEPAM 2 MG/ML IJ SOLN: 1.0000 mg | INTRAMUSCULAR | Status: DC | PRN

## 2023-03-01 MED ORDER — ACETAMINOPHEN 325 MG PO TABS
650.0000 mg | ORAL_TABLET | Freq: Four times a day (QID) | ORAL | Status: DC | PRN
  Administered 2023-03-02: 650 mg via ORAL
  Filled 2023-03-01: qty 2

## 2023-03-01 MED ORDER — ONDANSETRON HCL 4 MG/2ML IJ SOLN: 4.0000 mg | Freq: Four times a day (QID) | INTRAMUSCULAR | Status: DC | PRN

## 2023-03-01 MED ORDER — ACETAMINOPHEN 650 MG RE SUPP: 650.0000 mg | Freq: Four times a day (QID) | RECTAL | Status: DC | PRN

## 2023-03-01 MED ORDER — LORAZEPAM 2 MG/ML IJ SOLN: 0.0000 mg | Freq: Two times a day (BID) | INTRAMUSCULAR | Status: DC

## 2023-03-01 MED ORDER — SODIUM CHLORIDE 0.9% FLUSH
3.0000 mL | Freq: Two times a day (BID) | INTRAVENOUS | Status: DC
  Administered 2023-03-01 – 2023-03-04 (×5): 3 mL via INTRAVENOUS

## 2023-03-01 MED ORDER — ADULT MULTIVITAMIN W/MINERALS CH
1.0000 | ORAL_TABLET | Freq: Every day | ORAL | Status: DC
  Administered 2023-03-02 – 2023-03-04 (×3): 1 via ORAL
  Filled 2023-03-01 (×3): qty 1

## 2023-03-01 MED ORDER — ONDANSETRON HCL 4 MG/2ML IJ SOLN
4.0000 mg | Freq: Once | INTRAMUSCULAR | Status: AC
  Administered 2023-03-01: 4 mg via INTRAVENOUS
  Filled 2023-03-01: qty 2

## 2023-03-01 MED ORDER — POTASSIUM CHLORIDE IN NACL 20-0.9 MEQ/L-% IV SOLN
INTRAVENOUS | Status: AC
  Filled 2023-03-01 (×4): qty 1000

## 2023-03-01 MED ORDER — GABAPENTIN 100 MG PO CAPS
100.0000 mg | ORAL_CAPSULE | Freq: Three times a day (TID) | ORAL | Status: DC
  Administered 2023-03-01 – 2023-03-04 (×8): 100 mg via ORAL
  Filled 2023-03-01 (×8): qty 1

## 2023-03-01 MED ORDER — PANTOPRAZOLE SODIUM 40 MG IV SOLR
40.0000 mg | Freq: Once | INTRAVENOUS | Status: AC
  Administered 2023-03-01: 40 mg via INTRAVENOUS
  Filled 2023-03-01: qty 10

## 2023-03-01 MED ORDER — FIDAXOMICIN 200 MG PO TABS
200.0000 mg | ORAL_TABLET | Freq: Two times a day (BID) | ORAL | Status: DC
  Administered 2023-03-01 – 2023-03-04 (×6): 200 mg via ORAL
  Filled 2023-03-01 (×6): qty 1

## 2023-03-01 MED ORDER — MAGNESIUM SULFATE 2 GM/50ML IV SOLN
2.0000 g | Freq: Once | INTRAVENOUS | Status: AC
  Administered 2023-03-01: 2 g via INTRAVENOUS
  Filled 2023-03-01: qty 50

## 2023-03-01 MED ORDER — THIAMINE HCL 100 MG/ML IJ SOLN
100.0000 mg | Freq: Every day | INTRAMUSCULAR | Status: DC
  Administered 2023-03-01 – 2023-03-03 (×3): 100 mg via INTRAVENOUS
  Filled 2023-03-01 (×3): qty 2

## 2023-03-01 MED ORDER — LORAZEPAM 2 MG/ML IJ SOLN: 0.0000 mg | Freq: Four times a day (QID) | INTRAMUSCULAR | Status: DC

## 2023-03-01 MED ORDER — ONDANSETRON HCL 4 MG PO TABS: 4.0000 mg | ORAL_TABLET | Freq: Four times a day (QID) | ORAL | Status: DC | PRN

## 2023-03-01 MED ORDER — HYDROMORPHONE HCL 1 MG/ML IJ SOLN
0.5000 mg | INTRAMUSCULAR | Status: DC | PRN
  Administered 2023-03-01 – 2023-03-02 (×4): 1 mg via INTRAVENOUS
  Filled 2023-03-01 (×4): qty 1

## 2023-03-01 MED ORDER — PANTOPRAZOLE SODIUM 40 MG PO TBEC
40.0000 mg | DELAYED_RELEASE_TABLET | Freq: Two times a day (BID) | ORAL | Status: DC
  Administered 2023-03-02 – 2023-03-04 (×5): 40 mg via ORAL
  Filled 2023-03-01 (×5): qty 1

## 2023-03-01 MED ORDER — LACTATED RINGERS IV SOLN: INTRAVENOUS | Status: DC

## 2023-03-01 MED ORDER — HYDROMORPHONE HCL 1 MG/ML IJ SOLN
1.0000 mg | Freq: Once | INTRAMUSCULAR | Status: AC
  Administered 2023-03-01: 1 mg via INTRAVENOUS
  Filled 2023-03-01: qty 1

## 2023-03-01 MED ORDER — FOLIC ACID 1 MG PO TABS
1.0000 mg | ORAL_TABLET | Freq: Every day | ORAL | Status: DC
  Administered 2023-03-01 – 2023-03-04 (×4): 1 mg via ORAL
  Filled 2023-03-01 (×4): qty 1

## 2023-03-01 MED ORDER — LACTATED RINGERS IV BOLUS
1000.0000 mL | Freq: Once | INTRAVENOUS | Status: AC
  Administered 2023-03-01: 1000 mL via INTRAVENOUS

## 2023-03-01 MED ORDER — POTASSIUM CHLORIDE 10 MEQ/100ML IV SOLN
10.0000 meq | INTRAVENOUS | Status: AC
  Administered 2023-03-01 (×4): 10 meq via INTRAVENOUS
  Filled 2023-03-01 (×4): qty 100

## 2023-03-01 NOTE — H&P (Addendum)
 History and Physical    Samuel Blevins FMW:[id number] DOB: 08-21-1965 DOA: 03/01/2023  PCP: Brien Belvie BRAVO, MD   Patient coming from: Home    Chief Complaint: Abdominal pain, N/V/D  HPI: Samuel Blevins is a 58 y.o. 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 difficile colitis, hypertension no longer on antihypertensives, now presenting with severe abdominal pain, nausea, vomiting, and diarrhea.  Patient reports that he had had not been experiencing much abdominal discomfort at all for a few months until the past 2 weeks.  Over the past 2 weeks, he has had worsening generalized abdominal pain with nausea, vomiting, and frequent loose stools.  He reports that the pain is the same as what he had experienced with pancreatitis in the past.  He reports that he has not eaten anything in several days due to his symptoms but has continued to consume at least 1/5 gallon of liquor daily.    ED Course: Upon arrival to the ED, patient is found to be afebrile and saturating well on room air with mild tachypnea, mild tachycardia, and stable blood pressure.  Labs are most notable for sodium 131, potassium 2.4, magnesium  1.0, creatinine 1.29, AST 78, normal bilirubin, normal WBC, hemoglobin 8.8, negative FOBT, lipase 375, and lactic acid 4.7.  Patient was treated in the ED with 1 L LR, IV magnesium , IV potassium, IV Protonix , thiamine , folate, and Dilaudid .  Review of Systems:  All other systems reviewed and apart from HPI, are negative.  Past Medical History:  Diagnosis Date   Alcohol withdrawal syndrome with complication (HCC)    Alcoholism (HCC)    Elevated AST (SGOT)    Gastrointestinal hemorrhage    Homeless    Hypertension    Pancreatic insufficiency    takes Creon    Symptomatic anemia 11/23/2015   Thrombocytopenia (HCC) 05/09/2021    Past Surgical History:  Procedure Laterality  Date   BIOPSY  06/23/2019   Procedure: BIOPSY;  Surgeon: San Sandor GAILS, DO;  Location: [facility] ENDOSCOPY;  Service: Gastroenterology;;   BIOPSY  12/12/2020   Procedure: BIOPSY;  Surgeon: Charlanne Groom, MD;  Location: [facility] ENDOSCOPY;  Service: Endoscopy;;   BIOPSY  08/04/2021   Procedure: BIOPSY;  Surgeon: Stacia Glendia BRAVO, MD;  Location: [facility] ENDOSCOPY;  Service: Gastroenterology;;   ESOPHAGOGASTRODUODENOSCOPY N/A 08/04/2021   Procedure: ESOPHAGOGASTRODUODENOSCOPY (EGD);  Surgeon: Stacia Glendia BRAVO, MD;  Location: [facility];  Service: Gastroenterology;  Laterality: N/A;   ESOPHAGOGASTRODUODENOSCOPY (EGD) WITH PROPOFOL  N/A 06/23/2019   Procedure: ESOPHAGOGASTRODUODENOSCOPY (EGD) WITH PROPOFOL ;  Surgeon: San Sandor GAILS, DO;  Location: [facility] ENDOSCOPY;  Service: Gastroenterology;  Laterality: N/A;   ESOPHAGOGASTRODUODENOSCOPY (EGD) WITH PROPOFOL  N/A 12/12/2020   Procedure: ESOPHAGOGASTRODUODENOSCOPY (EGD) WITH PROPOFOL ;  Surgeon: Charlanne Groom, MD;  Location: [facility] ENDOSCOPY;  Service: Endoscopy;  Laterality: N/A;   HOT HEMOSTASIS N/A 06/23/2019   Procedure: HOT HEMOSTASIS (ARGON PLASMA COAGULATION/BICAP);  Surgeon: San Sandor GAILS, DO;  Location: [facility] ENDOSCOPY;  Service: Gastroenterology;  Laterality: N/A;   IR PARACENTESIS  08/21/2022   IR PARACENTESIS  08/26/2022   IR PARACENTESIS  08/30/2022   IR PARACENTESIS  09/02/2022   IR PARACENTESIS  09/06/2022    Social History:   reports that he has been smoking cigarettes. He has a 15 pack-year smoking history. He has never used smokeless tobacco. He reports current alcohol use of about 28.0 standard drinks of alcohol per week. He reports that he does not use drugs.  No Known Allergies  Family History  Problem Relation Age of Onset   Diabetes Mellitus II Neg Hx    Colon cancer Neg Hx    Stomach cancer Neg Hx    Pancreatic cancer Neg Hx      Prior to Admission medications   Medication Sig Start Date End Date Taking? Authorizing Provider   acetaminophen  (TYLENOL ) 325 MG tablet Take 2 tablets (650 mg total) by mouth every 6 (six) hours as needed for mild pain (or Fever >/= 101). 10/12/22  Yes Dorinda Drue DASEN, MD  gabapentin  (NEURONTIN ) 100 MG capsule Take 1 capsule (100 mg total) by mouth 3 (three) times daily. 11/13/22  Yes Brien Belvie BRAVO, MD  hydrOXYzine  (ATARAX ) 25 MG tablet Take 1 tablet (25 mg total) by mouth at bedtime as needed for anxiety. 10/02/22  Yes Vernon Ranks, MD  pantoprazole  (PROTONIX ) 40 MG tablet Take 1 tablet (40 mg total) by mouth 2 (two) times daily before a meal. 10/02/22  Yes Vernon Ranks, MD    Physical Exam: Vitals:   03/01/23 1745 03/01/23 1812 03/01/23 1845 03/01/23 1900  BP: 119/81  120/78 (!) 116/55  Pulse: 99 99 98 97  Resp:  16 20 15   Temp:      TempSrc:      SpO2: 100% 100% 99% 100%    Constitutional: NAD, no pallor or diaphoresis   Eyes: PERTLA, lids and conjunctivae normal ENMT: Mucous membranes are moist. Posterior pharynx clear of any exudate or lesions.   Neck: supple, no masses  Respiratory: no wheezing, no crackles. No accessory muscle use.  Cardiovascular: S1 & S2 heard, regular rate and rhythm. No extremity edema.  Abdomen: Soft, generally tender. Bowel sounds active.  Musculoskeletal: no clubbing / cyanosis. No joint deformity upper and lower extremities.   Skin: no significant rashes, lesions, ulcers. Warm, dry, well-perfused. Neurologic: CN 2-12 grossly intact. Moving all extremities. Alert and oriented.  Psychiatric: Calm. Cooperative.    Labs and Imaging on Admission: I have personally reviewed following labs and imaging studies  CBC: Recent Labs  Lab 03/01/23 1228  WBC 7.8  NEUTROABS 5.9  HGB 8.8*  HCT 25.7*  MCV 88.3  PLT 231   Basic Metabolic Panel: Recent Labs  Lab 03/01/23 1228  NA 131*  K 2.4*  CL 87*  CO2 25  GLUCOSE 86  BUN 24*  CREATININE 1.29*  CALCIUM 8.9  MG 1.0*   GFR: CrCl cannot be calculated (Unknown ideal weight.). Liver Function  Tests: Recent Labs  Lab 03/01/23 1228  AST 78*  ALT 16  ALKPHOS 150*  BILITOT 1.1  PROT 8.5*  ALBUMIN  3.2*   Recent Labs  Lab 03/01/23 1228  LIPASE 375*   No results for input(s): AMMONIA in the last 168 hours. Coagulation Profile: No results for input(s): INR, PROTIME in the last 168 hours. Cardiac Enzymes: No results for input(s): CKTOTAL, CKMB, CKMBINDEX, TROPONINI in the last 168 hours. BNP (last 3 results) No results for input(s): PROBNP in the last 8760 hours. HbA1C: No results for input(s): HGBA1C in the last 72 hours. CBG: No results for input(s): GLUCAP in the last 168 hours. Lipid Profile: No results for input(s): CHOL, HDL, LDLCALC, TRIG, CHOLHDL, LDLDIRECT in the last 72 hours. Thyroid Function Tests: No results for input(s): TSH, T4TOTAL, FREET4, T3FREE, THYROIDAB in the last 72 hours. Anemia Panel: No results for input(s): VITAMINB12, FOLATE, FERRITIN, TIBC, IRON , RETICCTPCT in the last 72 hours. Urine analysis:    Component Value Date/Time   COLORURINE  YELLOW 10/08/2022 2114   APPEARANCEUR HAZY (A) 10/08/2022 2114   LABSPEC 1.027 10/08/2022 2114   PHURINE 5.0 10/08/2022 2114   GLUCOSEU NEGATIVE 10/08/2022 2114   HGBUR NEGATIVE 10/08/2022 2114   BILIRUBINUR NEGATIVE 10/08/2022 2114   KETONESUR NEGATIVE 10/08/2022 2114   PROTEINUR 30 (A) 10/08/2022 2114   NITRITE NEGATIVE 10/08/2022 2114   LEUKOCYTESUR LARGE (A) 10/08/2022 2114   Sepsis Labs: @LABRCNTIP (procalcitonin:4,lacticidven:4) ) Recent Results (from the past 240 hours)  C Difficile Quick Screen w PCR reflex     Status: Abnormal   Collection Time: 03/01/23  4:13 PM   Specimen: Stool  Result Value Ref Range Status   C Diff antigen POSITIVE (A) NEGATIVE Final   C Diff toxin NEGATIVE NEGATIVE Final   C Diff interpretation Results are indeterminate. See PCR results.  Final    Comment: Performed at [facility], [address],  [place], [place] [address]     Radiological Exams on Admission: CT ABDOMEN PELVIS W CONTRAST Result Date: 03/01/2023 CLINICAL DATA:  Acute abdominal pain.  History of pancreatitis. EXAM: CT ABDOMEN AND PELVIS WITH CONTRAST TECHNIQUE: Multidetector CT imaging of the abdomen and pelvis was performed using the standard protocol following bolus administration of intravenous contrast. RADIATION DOSE REDUCTION: This exam was performed according to the departmental dose-optimization program which includes automated exposure control, adjustment of the mA and/or kV according to patient size and/or use of iterative reconstruction technique. CONTRAST:  75mL OMNIPAQUE  IOHEXOL  350 MG/ML SOLN COMPARISON:  CT abdomen and pelvis 10/08/2022. FINDINGS: Lower chest: No acute abnormality. Hepatobiliary: There is new diffuse fatty infiltration of the liver. Gallbladder and bile ducts are within normal limits. Pancreas: There is trace inflammatory stranding surrounding the head of the pancreas. Pancreatic body and tail are without inflammation. Pancreatic stent remains in place. No pancreatic ductal dilatation. Pancreatic head calcifications are again noted. No pancreatic fluid collections are identified. Spleen: Normal in size without focal abnormality. Adrenals/Urinary Tract: Adrenal glands are unremarkable. Kidneys are normal, without renal calculi, focal lesion, or hydronephrosis. Bladder is unremarkable. Stomach/Bowel: There is mild diffuse colonic wall thickening without surrounding inflammation. There is no evidence for bowel obstruction, pneumatosis or free air. The appendix is not seen. There is a small amount of fluid in the distal esophagus which can be seen with gastroesophageal reflux. The stomach and small bowel loops appear within normal limits. Vascular/Lymphatic: Aortic atherosclerosis. No enlarged abdominal or pelvic lymph nodes. Reproductive: There is nodular protrusion of the prostate into the bladder base. The prostate  is nonenlarged. Findings are similar to prior. Other: No abdominal wall hernia or abnormality. No abdominopelvic ascites. Musculoskeletal: There are degenerative changes at L5-S1. IMPRESSION: 1. Trace inflammatory stranding surrounding the head of the pancreas compatible with acute pancreatitis. No fluid collections. 2. Pancreatic stent remains in place. 3. Mild diffuse colonic wall thickening compatible with colitis. 4. New diffuse fatty infiltration of the liver. 5. Small amount of fluid in the distal esophagus which can be seen with gastroesophageal reflux. 6. Aortic atherosclerosis. 7. Stable nodular protrusion of the prostate into the bladder base. Recommend clinical correlation and follow-up. Aortic Atherosclerosis (ICD10-I70.0). Electronically Signed   By: Greig Pique M.D.   On: 03/01/2023 19:04    Assessment/Plan   1. Acute on chronic pancreatitis - Continue bowel rest, IVF hydration, pain-control, supportive care, and monitor for complication    2. Alcoholism  - Drinks >1/5th gallon liquor daily    - Monitor with CIWA, use Ativan  as-needed, supplement vitamins  3. AKI - This is prerenal in setting of N/V/D, kidneys appear normal on CT in ED - Continue IVF hydration, renally-dose medications, repeat chem panel    4. Anemia  - Appears stable with no overt bleeding and negative FOBT  - Monitor, transfuse if needed    5. Hyponatremia  - Mild, in setting of hypovolemia  - Continue isotonic IVF and monitor   6. Hypokalemia; hypomagnesemia  - D/t alcoholism, poor nutrition, GI-losses - Replacing   7. Diarrhea  - Hx of C diff, no fever or leukocytosis currently  - Initial C diff results indeterminate, follow-up on C diff PCR and GI pathogen panel, continue IVF hydration and electrolyte replacement   ADDENDUM (20:40): C diff PCR is positive. He is having 10 or more liquid stools per day. 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     DVT prophylaxis: SCDs  Code Status: Full  Level of Care: Level of care: Telemetry Medical Family Communication: None present  Disposition Plan:  Patient is from: Home  Anticipated d/c is to: SNF Anticipated d/c date is: 03/03/22  Patient currently: Pending pain-control, stable labs, tolerance of adequate oral intake  Consults called: None  Admission status: Inpatient     Evalene GORMAN Sprinkles, MD [facility]  03/01/2023, 7:50 PM

## 2023-03-01 NOTE — ED Provider Triage Note (Signed)
 Emergency Medicine Provider Triage Evaluation Note  Samuel Blevins , a 58 y.o. male  was evaluated in triage.  Pt complains of abdominal pain, nausea, vomiting, loose stool, blood in stool.  History of pancreatitis  Review of Systems  Positive: Abdominal pain, nausea, vomiting, loose stool, blood in stool Negative: fever  Physical Exam  BP 106/75 (BP Location: Right Arm)   Pulse (!) 112   Temp 98.3 F (36.8 C) (Oral)   Resp 18   SpO2 100%  Gen:   Awake, no distress   Resp:  Normal effort  MSK:   Moves extremities without difficulty  Other:  Diffuse tenderness with light palpation with RUQ most tender  Medical Decision Making  Medically screening exam initiated at 1:05 PM.  Appropriate orders placed.  Samuel Blevins was informed that the remainder of the evaluation will be completed by another provider, this initial triage assessment does not replace that evaluation, and the importance of remaining in the ED until their evaluation is complete.  Labs, ct,  and zofran  ordered   Minnie Tinnie BRAVO, [place] 03/01/23 1307

## 2023-03-01 NOTE — ED Provider Notes (Signed)
 [name] Provider Note   CSN: [id number] Arrival date & time: 03/01/23  1145     History  Chief Complaint  Patient presents with   Abdominal Pain    Samuel Blevins is a 58 y.o. male with PMH as listed below who presents with 10 out of 10 constant worsening all over abdominal pain, nausea, vomiting, loose stool, blood in stool. Has had 10 bowel movements this morning that were black and consistent with blood.  Patient states he has not been able to eat or drink anything in several days, as high as 1 to 2 weeks.  Per chart review, has a history of abdominal pain secondary to acute on chronic alcoholic pancreatitis, history of recurrent C. difficile colitis, alcoholic gastritis, recurrent VRE SBP, hypokalemia/hypomagnesemia, and chronic alcohol abuse.  He endorses fever/chills, denies any urinary symptoms, chest pain or shortness of breath.  No history of abdominal surgery.  Does take Creon  for pancreatic insufficiency and has history of pancreatic duct stent placement. He endorses continued alcohol use, a fifth and a pint of liquor per day. Last drink was yesterday.     Past Medical History:  Diagnosis Date   Alcohol withdrawal syndrome with complication (HCC)    Alcoholism (HCC)    Elevated AST (SGOT)    Gastrointestinal hemorrhage    Homeless    Hypertension    Pancreatic insufficiency    takes Creon    Symptomatic anemia 11/23/2015   Thrombocytopenia (HCC) 05/09/2021       Home Medications Prior to Admission medications   Medication Sig Start Date End Date Taking? Authorizing Provider  acetaminophen  (TYLENOL ) 325 MG tablet Take 2 tablets (650 mg total) by mouth every 6 (six) hours as needed for mild pain (or Fever >/= 101). 10/12/22   Dorinda Drue DASEN, MD  gabapentin  (NEURONTIN ) 100 MG capsule Take 1 capsule (100 mg total) by mouth 3 (three) times daily. 11/13/22   Brien Belvie BRAVO, MD  hydrOXYzine  (ATARAX ) 25 MG tablet Take 1  tablet (25 mg total) by mouth at bedtime as needed for anxiety. 10/02/22   Vernon Ranks, MD  nicotine  (NICODERM CQ  - DOSED IN MG/24 HOURS) 14 mg/24hr patch Place 1 patch (14 mg total) onto the skin daily. 10/02/22   Vernon Ranks, MD  pantoprazole  (PROTONIX ) 40 MG tablet Take 1 tablet (40 mg total) by mouth 2 (two) times daily before a meal. 10/02/22   Vernon Ranks, MD      Allergies    Patient has no known allergies.    Review of Systems   Review of Systems A 10 point review of systems was performed and is negative unless otherwise reported in HPI.  Physical Exam Updated Vital Signs BP 106/75 (BP Location: Right Arm)   Pulse (!) 112   Temp 98.3 F (36.8 C) (Oral)   Resp 18   SpO2 100%  Physical Exam General: Uncomfortable appearing male, lying in bed.  HEENT: PERRLA, Sclera anicteric, dry mucous membranes with cracked lips, trachea midline.  Cardiology: RRR, no murmurs/rubs/gallops.  Resp: Normal respiratory rate and effort. CTAB, no wheezes, rhonchi, crackles.  Abd: Soft, diffusely tender throughout, non-distended.  Involuntary guarding present in all quadrants. GU: Deferred. MSK: No peripheral edema or signs of trauma. Skin: warm, dry.  Back: + BL CVA tenderness Neuro: A&Ox4, CNs II-XII grossly intact. MAEs. Sensation grossly intact.  Psych: Normal mood and affect.   ED Results / Procedures / Treatments   Labs (all labs ordered are  listed, but only abnormal results are displayed) Labs Reviewed  CBC WITH DIFFERENTIAL/PLATELET - Abnormal; Notable for the following components:      Result Value   RBC 2.91 (*)    Hemoglobin 8.8 (*)    HCT 25.7 (*)    Abs Immature Granulocytes 0.08 (*)    All other components within normal limits  COMPREHENSIVE METABOLIC PANEL - Abnormal; Notable for the following components:   Sodium 131 (*)    Potassium 2.4 (*)    Chloride 87 (*)    BUN 24 (*)    Creatinine, Ser 1.29 (*)    Total Protein 8.5 (*)    Albumin  3.2 (*)    AST 78 (*)     Alkaline Phosphatase 150 (*)    Anion gap 19 (*)    All other components within normal limits  LIPASE, BLOOD - Abnormal; Notable for the following components:   Lipase 375 (*)    All other components within normal limits  MAGNESIUM  - Abnormal; Notable for the following components:   Magnesium  1.0 (*)    All other components within normal limits  GASTROINTESTINAL PANEL BY PCR, STOOL (REPLACES STOOL CULTURE)  C DIFFICILE QUICK SCREEN W PCR REFLEX    URINALYSIS, ROUTINE W REFLEX MICROSCOPIC  POC OCCULT BLOOD, ED    EKG None  Radiology CT ABDOMEN PELVIS W CONTRAST Result Date: 03/01/2023 CLINICAL DATA:  Acute abdominal pain.  History of pancreatitis. EXAM: CT ABDOMEN AND PELVIS WITH CONTRAST TECHNIQUE: Multidetector CT imaging of the abdomen and pelvis was performed using the standard protocol following bolus administration of intravenous contrast. RADIATION DOSE REDUCTION: This exam was performed according to the departmental dose-optimization program which includes automated exposure control, adjustment of the mA and/or kV according to patient size and/or use of iterative reconstruction technique. CONTRAST:  75mL OMNIPAQUE  IOHEXOL  350 MG/ML SOLN COMPARISON:  CT abdomen and pelvis 10/08/2022. FINDINGS: Lower chest: No acute abnormality. Hepatobiliary: There is new diffuse fatty infiltration of the liver. Gallbladder and bile ducts are within normal limits. Pancreas: There is trace inflammatory stranding surrounding the head of the pancreas. Pancreatic body and tail are without inflammation. Pancreatic stent remains in place. No pancreatic ductal dilatation. Pancreatic head calcifications are again noted. No pancreatic fluid collections are identified. Spleen: Normal in size without focal abnormality. Adrenals/Urinary Tract: Adrenal glands are unremarkable. Kidneys are normal, without renal calculi, focal lesion, or hydronephrosis. Bladder is unremarkable. Stomach/Bowel: There is mild diffuse colonic  wall thickening without surrounding inflammation. There is no evidence for bowel obstruction, pneumatosis or free air. The appendix is not seen. There is a small amount of fluid in the distal esophagus which can be seen with gastroesophageal reflux. The stomach and small bowel loops appear within normal limits. Vascular/Lymphatic: Aortic atherosclerosis. No enlarged abdominal or pelvic lymph nodes. Reproductive: There is nodular protrusion of the prostate into the bladder base. The prostate is nonenlarged. Findings are similar to prior. Other: No abdominal wall hernia or abnormality. No abdominopelvic ascites. Musculoskeletal: There are degenerative changes at L5-S1. IMPRESSION: 1. Trace inflammatory stranding surrounding the head of the pancreas compatible with acute pancreatitis. No fluid collections. 2. Pancreatic stent remains in place. 3. Mild diffuse colonic wall thickening compatible with colitis. 4. New diffuse fatty infiltration of the liver. 5. Small amount of fluid in the distal esophagus which can be seen with gastroesophageal reflux. 6. Aortic atherosclerosis. 7. Stable nodular protrusion of the prostate into the bladder base. Recommend clinical correlation and follow-up. Aortic Atherosclerosis (ICD10-I70.0). Electronically Signed  By: Greig Pique M.D.   On: 03/01/2023 19:04    Procedures Procedures    Medications Ordered in ED Medications  thiamine  (VITAMIN B1) injection 100 mg (100 mg Intravenous Given 03/01/23 1953)  folic acid  (FOLVITE ) tablet 1 mg (1 mg Oral Given 03/01/23 1952)  pantoprazole  (PROTONIX ) EC tablet 40 mg (has no administration in time range)  gabapentin  (NEURONTIN ) capsule 100 mg (has no administration in time range)  LORazepam  (ATIVAN ) tablet 1-4 mg (has no administration in time range)    Or  LORazepam  (ATIVAN ) injection 1-4 mg (has no administration in time range)  multivitamin with minerals tablet 1 tablet (has no administration in time range)  0.9 % NaCl with KCl  20 mEq/ L  infusion ( Intravenous New Bag/Given 03/01/23 2110)  LORazepam  (ATIVAN ) injection 0-4 mg (0 mg Intravenous Hold 03/01/23 2009)    Followed by  LORazepam  (ATIVAN ) injection 0-4 mg (has no administration in time range)  sodium chloride  flush (NS) 0.9 % injection 3 mL (3 mLs Intravenous Given 03/01/23 2108)  acetaminophen  (TYLENOL ) tablet 650 mg (has no administration in time range)    Or  acetaminophen  (TYLENOL ) suppository 650 mg (has no administration in time range)  HYDROmorphone  (DILAUDID ) injection 0.5-1 mg (1 mg Intravenous Given 03/01/23 2107)  ondansetron  (ZOFRAN ) tablet 4 mg (has no administration in time range)    Or  ondansetron  (ZOFRAN ) injection 4 mg (has no administration in time range)  fidaxomicin  (DIFICID ) tablet 200 mg (has no administration in time range)  ondansetron  (ZOFRAN ) injection 4 mg (4 mg Intravenous Given 03/01/23 1556)  potassium chloride  10 mEq in 100 mL IVPB (0 mEq Intravenous Stopped 03/01/23 1951)  lactated ringers  bolus 1,000 mL (0 mLs Intravenous Stopped 03/01/23 1702)  HYDROmorphone  (DILAUDID ) injection 1 mg (1 mg Intravenous Given 03/01/23 1557)  magnesium  sulfate IVPB 2 g 50 mL (0 g Intravenous Stopped 03/01/23 1702)  pantoprazole  (PROTONIX ) injection 40 mg (40 mg Intravenous Given 03/01/23 1604)  iohexol  (OMNIPAQUE ) 350 MG/ML injection 75 mL (75 mLs Intravenous Contrast Given 03/01/23 1827)    ED Course/ Medical Decision Making/ A&P                          Medical Decision Making Amount and/or Complexity of Data Reviewed Labs: ordered. Decision-making details documented in ED Course. Radiology:  Decision-making details documented in ED Course.  Risk Prescription drug management. Decision regarding hospitalization.    This patient presents to the ED for concern of abdominal pain, N/V, this involves an extensive number of treatment options, and is a complaint that carries with it a high risk of complications and morbidity.  I considered the following  differential and admission for this acute, potentially life threatening condition.   MDM:    For DDX for abdominal pain includes but is not limited to:  Abdominal exam with peritoneal signs. Consider acute on chronic pancreatitis, gastritis, upper GI bleeding including PUD/gastric ulcer, acute hepatobiliary disease (including acute cholecystitis or cholangitis), acute pancreatitis (neg lipase), PUD (including gastric perforation), acute infectious processes (pneumonia, hepatitis, pyelonephritis), acute appendicitis, vascular catastrophe, bowel obstruction, viscus perforation, or diverticulitis.   Given report of 10 black bowel movements this morning will get POC stool occult as well as stool studies given history of recurrent C. difficile colitis.  Clinical Course as of 03/01/23 1931  Sat Mar 01, 2023  1520 Lipase(!): 375 +lipase [HN]  1520 Potassium(!!): 2.4 +hypokalemia [HN]  1520 Creatinine(!): 1.29 +AKI, up from 0.5-0.7 baseline [HN]  1521 Hemoglobin(!): 8.8 Approximately at baseline [HN]  1521 WBC: 7.8 No leukocytosis  [HN]  1522 AST(!): 78 Mild elevation in AST [HN]  1540 Magnesium (!): 1.0 +hypomagnesemia [HN]  1546 Labs demonstrate hypokalemia, hypomagnesemia, AKI, elevated lipase indicating likely acute on chronic pancreatitis.  This is very consistent with patient's prior presentation in August 2024.  His anemia appears to be at baseline. [HN]  1752 Lactic Acid, Venous(!!): 4.7 Prior to IVF [HN]  1831 Fecal Occult Blood, POC: NEGATIVE [HN]  1913 CT ABDOMEN PELVIS W CONTRAST 1. Trace inflammatory stranding surrounding the head of the pancreas compatible with acute pancreatitis. No fluid collections. 2. Pancreatic stent remains in place. 3. Mild diffuse colonic wall thickening  compatible with colitis. 4. New diffuse fatty infiltration of the liver. 5. Small amount of fluid in the distal esophagus which can be seen with gastroesophageal reflux. 6. Aortic  atherosclerosis. 7. Stable nodular protrusion of the prostate into the bladder base.   [HN]  1914 CT scan shows acute pancreatitis, colitis, new fatty infiltration of liver, and reflux. [HN]  1915 C Difficile Quick Screen w PCR reflex(!) Antigen positive, toxin negative. Will reflex to PCR. [HN]  1916 Finishing diluted potassium now, will have received 1.5L IVF total. Will repeat lactic after. Will consult to medicine for admission. [HN]  1930 Patient reevaluated and pain is still okay. Added on thiamin/folate and LR infusion, as well as CIWA q6h. [HN]    Clinical Course User Index [HN] Franklyn Sid SAILOR, MD    Labs: I Ordered, and personally interpreted labs.  The pertinent results include: Those listed above  Imaging Studies ordered: CT Abdo pelvis ordered from triage I independently visualized and interpreted imaging. I agree with the radiologist interpretation  Additional history obtained from chart review.   Cardiac Monitoring: The patient was maintained on a cardiac monitor.  I personally viewed and interpreted the cardiac monitored which showed an underlying rhythm of: Sinus tachycardia and NSR  Reevaluation: After the interventions noted above, I reevaluated the patient and found that they have :improved  Social Determinants of Health: Lives independently  Disposition:  Admitted to medicine  Co morbidities that complicate the patient evaluation  Past Medical History:  Diagnosis Date   Alcohol withdrawal syndrome with complication (HCC)    Alcoholism (HCC)    Elevated AST (SGOT)    Gastrointestinal hemorrhage    Homeless    Hypertension    Pancreatic insufficiency    takes Creon    Symptomatic anemia 11/23/2015   Thrombocytopenia (HCC) 05/09/2021     Medicines Meds ordered this encounter  Medications   ondansetron  (ZOFRAN ) injection 4 mg   potassium chloride  10 mEq in 100 mL IVPB   lactated ringers  bolus 1,000 mL   HYDROmorphone  (DILAUDID ) injection 1 mg    magnesium  sulfate IVPB 2 g 50 mL    I have reviewed the patients home medicines and have made adjustments as needed  Problem List / ED Course: Problem List Items Addressed This Visit       Digestive   Acute pancreatitis - Primary   Relevant Medications   pantoprazole  (PROTONIX ) EC tablet 40 mg (Start on 03/02/2023  8:00 AM)   HYDROmorphone  (DILAUDID ) injection 0.5-1 mg     Genitourinary   AKI (acute kidney injury) (HCC)     Other   Chronic anemia   Relevant Medications   folic acid  (FOLVITE ) tablet 1 mg   Hypokalemia   Hypomagnesemia   Other Visit Diagnoses       Dehydration  This note was created using dictation software, which may contain spelling or grammatical errors.    Franklyn Sid SAILOR, MD 03/01/23 2224

## 2023-03-01 NOTE — ED Notes (Signed)
 Critical potassium of 2.4 called from lab

## 2023-03-01 NOTE — ED Notes (Signed)
 ED TO INPATIENT HANDOFF REPORT  ED Nurse Name and Phone #: [phone]  S Name/Age/Gender Samuel Blevins 58 y.o. male Room/Bed: 033C/033C  Code Status   Code Status: Full Code  Home/SNF/Other Home Patient oriented to: self, place, time, and situation Is this baseline? Yes   Triage Complete: Triage complete  Chief Complaint Acute pancreatitis [K85.90]  Triage Note [facility] c/o lower abdominal pain x2 weeks with a hx of pancreatitis. Pt also states he has noticed some blood in his stool as well.         Allergies No Known Allergies  Level of Care/Admitting Diagnosis ED Disposition     ED Disposition  Admit   Condition  --   Comment  Hospital Area: [facility] [[id number]]  Level of Care: Telemetry Medical [104]  May admit patient to [facility] or [facility] if equivalent level of care is available:: Yes  Covid Evaluation: Asymptomatic - no recent exposure (last 10 days) testing not required  Diagnosis: Acute pancreatitis [577.0.ICD-9-CM]  Admitting Physician: CHARLTON EVALENE RAMAN [[id number]]  Attending Physician: CHARLTON EVALENE RAMAN [[id number]]  Certification:: I certify this patient will need inpatient services for at least 2 midnights  Expected Medical Readiness: 03/03/2023          B Medical/Surgery History Past Medical History:  Diagnosis Date   Alcohol withdrawal syndrome with complication (HCC)    Alcoholism (HCC)    Elevated AST (SGOT)    Gastrointestinal hemorrhage    Homeless    Hypertension    Pancreatic insufficiency    takes Creon    Symptomatic anemia 11/23/2015   Thrombocytopenia (HCC) 05/09/2021   Past Surgical History:  Procedure Laterality Date   BIOPSY  06/23/2019   Procedure: BIOPSY;  Surgeon: San Sandor GAILS, DO;  Location: [facility] ENDOSCOPY;  Service: Gastroenterology;;   BIOPSY  12/12/2020   Procedure: BIOPSY;  Surgeon: Charlanne Groom, MD;  Location: [facility] ENDOSCOPY;  Service: Endoscopy;;   BIOPSY   08/04/2021   Procedure: BIOPSY;  Surgeon: Stacia Glendia BRAVO, MD;  Location: [facility] ENDOSCOPY;  Service: Gastroenterology;;   ESOPHAGOGASTRODUODENOSCOPY N/A 08/04/2021   Procedure: ESOPHAGOGASTRODUODENOSCOPY (EGD);  Surgeon: Stacia Glendia BRAVO, MD;  Location: [facility];  Service: Gastroenterology;  Laterality: N/A;   ESOPHAGOGASTRODUODENOSCOPY (EGD) WITH PROPOFOL  N/A 06/23/2019   Procedure: ESOPHAGOGASTRODUODENOSCOPY (EGD) WITH PROPOFOL ;  Surgeon: San Sandor GAILS, DO;  Location: [facility] ENDOSCOPY;  Service: Gastroenterology;  Laterality: N/A;   ESOPHAGOGASTRODUODENOSCOPY (EGD) WITH PROPOFOL  N/A 12/12/2020   Procedure: ESOPHAGOGASTRODUODENOSCOPY (EGD) WITH PROPOFOL ;  Surgeon: Charlanne Groom, MD;  Location: [facility] ENDOSCOPY;  Service: Endoscopy;  Laterality: N/A;   HOT HEMOSTASIS N/A 06/23/2019   Procedure: HOT HEMOSTASIS (ARGON PLASMA COAGULATION/BICAP);  Surgeon: San Sandor GAILS, DO;  Location: [facility] ENDOSCOPY;  Service: Gastroenterology;  Laterality: N/A;   IR PARACENTESIS  08/21/2022   IR PARACENTESIS  08/26/2022   IR PARACENTESIS  08/30/2022   IR PARACENTESIS  09/02/2022   IR PARACENTESIS  09/06/2022     A IV Location/Drains/Wounds Patient Lines/Drains/Airways Status     Active Line/Drains/Airways     Name Placement date Placement time Site Days   Peripheral IV 03/01/23 20 G Anterior;Right Forearm 03/01/23  1541  Forearm  less than 1   Peripheral IV 03/01/23 20 G Left;Posterior Forearm 03/01/23  1542  Forearm  less than 1            Intake/Output Last 24 hours No intake or output data in the 24 hours ending 03/01/23 2004  Labs/Imaging Results for  orders placed or performed during the hospital encounter of 03/01/23 (from the past 48 hours)  CBC with Differential     Status: Abnormal   Collection Time: 03/01/23 12:28 PM  Result Value Ref Range   WBC 7.8 4.0 - 10.5 K/uL   RBC 2.91 (L) 4.22 - 5.81 MIL/uL   Hemoglobin 8.8 (L) 13.0 - 17.0 g/dL   HCT 74.2 (L) 60.9 - 47.9 %   MCV 88.3 80.0 -  100.0 fL   MCH 30.2 26.0 - 34.0 pg   MCHC 34.2 30.0 - 36.0 g/dL   RDW 85.8 88.4 - 84.4 %   Platelets 231 150 - 400 K/uL   nRBC 0.0 0.0 - 0.2 %   Neutrophils Relative % 75 %   Neutro Abs 5.9 1.7 - 7.7 K/uL   Lymphocytes Relative 18 %   Lymphs Abs 1.4 0.7 - 4.0 K/uL   Monocytes Relative 5 %   Monocytes Absolute 0.4 0.1 - 1.0 K/uL   Eosinophils Relative 1 %   Eosinophils Absolute 0.0 0.0 - 0.5 K/uL   Basophils Relative 0 %   Basophils Absolute 0.0 0.0 - 0.1 K/uL   Immature Granulocytes 1 %   Abs Immature Granulocytes 0.08 (H) 0.00 - 0.07 K/uL    Comment: Performed at [facility], [address] [place], [place] [address]  Comprehensive metabolic panel     Status: Abnormal   Collection Time: 03/01/23 12:28 PM  Result Value Ref Range   Sodium 131 (L) 135 - 145 mmol/L   Potassium 2.4 (LL) 3.5 - 5.1 mmol/L    Comment: CRITICAL RESULT CALLED TO, READ BACK BY AND VERIFIED WITH J,BLUE RN @1423  03/01/23 E,BENTON   Chloride 87 (L) 98 - 111 mmol/L   CO2 25 22 - 32 mmol/L   Glucose, Bld 86 70 - 99 mg/dL    Comment: Glucose reference range applies only to samples taken after fasting for at least 8 hours.   BUN 24 (H) 6 - 20 mg/dL   Creatinine, Ser 8.70 (H) 0.61 - 1.24 mg/dL   Calcium 8.9 8.9 - 89.6 mg/dL   Total Protein 8.5 (H) 6.5 - 8.1 g/dL   Albumin  3.2 (L) 3.5 - 5.0 g/dL   AST 78 (H) 15 - 41 U/L   ALT 16 0 - 44 U/L   Alkaline Phosphatase 150 (H) 38 - 126 U/L   Total Bilirubin 1.1 0.0 - 1.2 mg/dL   GFR, Estimated >39 >39 mL/min    Comment: (NOTE) Calculated using the CKD-EPI Creatinine Equation (2021)    Anion gap 19 (H) 5 - 15    Comment: ELECTROLYTES REPEATED TO VERIFY Performed at [facility], [address], [place], [place] [address]   Lipase, blood     Status: Abnormal   Collection Time: 03/01/23 12:28 PM  Result Value Ref Range   Lipase 375 (H) 11 - 51 U/L    Comment: Performed at [facility], [address], [place], [place] [address]  Magnesium       Status: Abnormal   Collection Time: 03/01/23 12:28 PM  Result Value Ref Range   Magnesium  1.0 (L) 1.7 - 2.4 mg/dL    Comment: Performed at [facility], [address], [place], [place] [address]  C Difficile Quick Screen w PCR reflex     Status: Abnormal   Collection Time: 03/01/23  4:13 PM   Specimen: Stool  Result Value Ref Range   C Diff antigen POSITIVE (A)  NEGATIVE   C Diff toxin NEGATIVE NEGATIVE   C Diff interpretation Results are indeterminate. See PCR results.     Comment: Performed at [facility], [address], [place], [place] [address]  Lactic acid, plasma     Status: Abnormal   Collection Time: 03/01/23  4:13 PM  Result Value Ref Range   Lactic Acid, Venous 4.7 (HH) 0.5 - 1.9 mmol/L    Comment: CRITICAL RESULT CALLED TO, READ BACK BY AND VERIFIED WITH T,ROUNTREE P.EMT @1656  03/01/23 E,BENTON Performed at [facility], [address] [place], [place] [address]   C. Diff by PCR, Reflexed     Status: Abnormal   Collection Time: 03/01/23  4:13 PM  Result Value Ref Range   Toxigenic C. Difficile by PCR POSITIVE (A) NEGATIVE    Comment: Positive for toxigenic C. difficile with little to no toxin production. Only treat if clinical presentation suggests symptomatic illness. Performed at [facility], [address] [place], [place] [address]   POC occult blood, ED     Status: None   Collection Time: 03/01/23  5:59 PM  Result Value Ref Range   Fecal Occult Bld NEGATIVE NEGATIVE   CT ABDOMEN PELVIS W CONTRAST Result Date: 03/01/2023 CLINICAL DATA:  Acute abdominal pain.  History of pancreatitis. EXAM: CT ABDOMEN AND PELVIS WITH CONTRAST TECHNIQUE: Multidetector CT imaging of the abdomen and pelvis was performed using the standard protocol following bolus administration of intravenous contrast. RADIATION DOSE REDUCTION: This exam was performed according to the departmental dose-optimization program which includes automated exposure control, adjustment of  the mA and/or kV according to patient size and/or use of iterative reconstruction technique. CONTRAST:  75mL OMNIPAQUE  IOHEXOL  350 MG/ML SOLN COMPARISON:  CT abdomen and pelvis 10/08/2022. FINDINGS: Lower chest: No acute abnormality. Hepatobiliary: There is new diffuse fatty infiltration of the liver. Gallbladder and bile ducts are within normal limits. Pancreas: There is trace inflammatory stranding surrounding the head of the pancreas. Pancreatic body and tail are without inflammation. Pancreatic stent remains in place. No pancreatic ductal dilatation. Pancreatic head calcifications are again noted. No pancreatic fluid collections are identified. Spleen: Normal in size without focal abnormality. Adrenals/Urinary Tract: Adrenal glands are unremarkable. Kidneys are normal, without renal calculi, focal lesion, or hydronephrosis. Bladder is unremarkable. Stomach/Bowel: There is mild diffuse colonic wall thickening without surrounding inflammation. There is no evidence for bowel obstruction, pneumatosis or free air. The appendix is not seen. There is a small amount of fluid in the distal esophagus which can be seen with gastroesophageal reflux. The stomach and small bowel loops appear within normal limits. Vascular/Lymphatic: Aortic atherosclerosis. No enlarged abdominal or pelvic lymph nodes. Reproductive: There is nodular protrusion of the prostate into the bladder base. The prostate is nonenlarged. Findings are similar to prior. Other: No abdominal wall hernia or abnormality. No abdominopelvic ascites. Musculoskeletal: There are degenerative changes at L5-S1. IMPRESSION: 1. Trace inflammatory stranding surrounding the head of the pancreas compatible with acute pancreatitis. No fluid collections. 2. Pancreatic stent remains in place. 3. Mild diffuse colonic wall thickening compatible with colitis. 4. New diffuse fatty infiltration of the liver. 5. Small amount of fluid in the distal esophagus which can be seen with  gastroesophageal reflux. 6. Aortic atherosclerosis. 7. Stable nodular protrusion of the prostate into the bladder base. Recommend clinical correlation and follow-up. Aortic Atherosclerosis (ICD10-I70.0). Electronically Signed   By: Greig Pique M.D.   On: 03/01/2023 19:04    Pending Labs [employer] (From  admission, onward)     Start     Ordered   03/02/23 0500  Magnesium   Tomorrow morning,   R        03/01/23 1949   03/02/23 0500  Phosphorus  Tomorrow morning,   R        03/01/23 1949   03/02/23 0500  CBC  Daily,   R      03/01/23 1949   03/02/23 0500  Comprehensive metabolic panel  Daily,   R      03/01/23 1949   03/02/23 0500  Protime-INR  Tomorrow morning,   R        03/01/23 1949   03/01/23 1553  Lactic acid, plasma  (Lactic Acid)  Now then every 2 hours,   R (with STAT occurrences)      03/01/23 1552   03/01/23 1544  Gastrointestinal Panel by PCR , Stool  (Gastrointestinal Panel by PCR, Stool                                                                                                                                                     **Does Not include CLOSTRIDIUM DIFFICILE testing. **If CDIFF testing is needed, place order from the C Difficile Testing order set.**)  Once,   URGENT        03/01/23 1543   03/01/23 1308  Urinalysis, Routine w reflex microscopic -Urine, Clean Catch  (ED Abdominal Pain)  Once,   URGENT       Question:  Specimen Source  Answer:  Urine, Clean Catch   03/01/23 1308            Vitals/Pain Today's Vitals   03/01/23 1845 03/01/23 1900 03/01/23 1951 03/01/23 1952  BP: 120/78 (!) 116/55  112/70  Pulse: 98 97  99  Resp: 20 15  13   Temp:   98 F (36.7 C)   TempSrc:   Oral   SpO2: 99% 100%  100%  PainSc:        Isolation Precautions Enteric precautions (UV disinfection)  Medications Medications  thiamine  (VITAMIN B1) injection 100 mg (100 mg Intravenous Given 03/01/23 1953)  folic acid  (FOLVITE ) tablet 1 mg (1 mg Oral Given 03/01/23  1952)  pantoprazole  (PROTONIX ) EC tablet 40 mg (has no administration in time range)  gabapentin  (NEURONTIN ) capsule 100 mg (has no administration in time range)  LORazepam  (ATIVAN ) tablet 1-4 mg (has no administration in time range)    Or  LORazepam  (ATIVAN ) injection 1-4 mg (has no administration in time range)  multivitamin with minerals tablet 1 tablet (has no administration in time range)  0.9 % NaCl with KCl 20 mEq/ L  infusion (has no administration in time range)  LORazepam  (ATIVAN ) injection 0-4 mg (has no administration in time range)    Followed by  LORazepam  (ATIVAN ) injection 0-4 mg (has  no administration in time range)  sodium chloride  flush (NS) 0.9 % injection 3 mL (has no administration in time range)  acetaminophen  (TYLENOL ) tablet 650 mg (has no administration in time range)    Or  acetaminophen  (TYLENOL ) suppository 650 mg (has no administration in time range)  HYDROmorphone  (DILAUDID ) injection 0.5-1 mg (has no administration in time range)  ondansetron  (ZOFRAN ) tablet 4 mg (has no administration in time range)    Or  ondansetron  (ZOFRAN ) injection 4 mg (has no administration in time range)  ondansetron  (ZOFRAN ) injection 4 mg (4 mg Intravenous Given 03/01/23 1556)  potassium chloride  10 mEq in 100 mL IVPB (0 mEq Intravenous Stopped 03/01/23 1951)  lactated ringers  bolus 1,000 mL (0 mLs Intravenous Stopped 03/01/23 1702)  HYDROmorphone  (DILAUDID ) injection 1 mg (1 mg Intravenous Given 03/01/23 1557)  magnesium  sulfate IVPB 2 g 50 mL (0 g Intravenous Stopped 03/01/23 1702)  pantoprazole  (PROTONIX ) injection 40 mg (40 mg Intravenous Given 03/01/23 1604)  iohexol  (OMNIPAQUE ) 350 MG/ML injection 75 mL (75 mLs Intravenous Contrast Given 03/01/23 1827)    Mobility walks     Focused Assessments    R Recommendations: See Admitting Provider Note  Report given to:   Additional Notes: Call or epic message for any additional details

## 2023-03-01 NOTE — ED Notes (Signed)
Patient transported to [phone]

## 2023-03-01 NOTE — ED Triage Notes (Signed)
 [facility] c/o lower abdominal pain x2 weeks with a hx of pancreatitis. Pt also states he has noticed some blood in his stool as well.

## 2023-03-02 DIAGNOSIS — K859 Acute pancreatitis without necrosis or infection, unspecified: Secondary | ICD-10-CM | POA: Diagnosis not present

## 2023-03-02 DIAGNOSIS — F1010 Alcohol abuse, uncomplicated: Secondary | ICD-10-CM | POA: Diagnosis not present

## 2023-03-02 DIAGNOSIS — I10 Essential (primary) hypertension: Secondary | ICD-10-CM | POA: Diagnosis not present

## 2023-03-02 DIAGNOSIS — N179 Acute kidney failure, unspecified: Secondary | ICD-10-CM | POA: Diagnosis not present

## 2023-03-02 LAB — CBC
HCT: 21.6 % — ABNORMAL LOW (ref 39.0–52.0)
Hemoglobin: 7.3 g/dL — ABNORMAL LOW (ref 13.0–17.0)
MCH: 29.8 pg (ref 26.0–34.0)
MCHC: 33.8 g/dL (ref 30.0–36.0)
MCV: 88.2 fL (ref 80.0–100.0)
Platelets: 185 10*3/uL (ref 150–400)
RBC: 2.45 MIL/uL — ABNORMAL LOW (ref 4.22–5.81)
RDW: 14.3 % (ref 11.5–15.5)
WBC: 7.5 10*3/uL (ref 4.0–10.5)
nRBC: 0 % (ref 0.0–0.2)

## 2023-03-02 LAB — COMPREHENSIVE METABOLIC PANEL
ALT: 13 U/L (ref 0–44)
AST: 52 U/L — ABNORMAL HIGH (ref 15–41)
Albumin: 2.6 g/dL — ABNORMAL LOW (ref 3.5–5.0)
Alkaline Phosphatase: 117 U/L (ref 38–126)
Anion gap: 16 — ABNORMAL HIGH (ref 5–15)
BUN: 21 mg/dL — ABNORMAL HIGH (ref 6–20)
CO2: 21 mmol/L — ABNORMAL LOW (ref 22–32)
Calcium: 8.1 mg/dL — ABNORMAL LOW (ref 8.9–10.3)
Chloride: 93 mmol/L — ABNORMAL LOW (ref 98–111)
Creatinine, Ser: 1.02 mg/dL (ref 0.61–1.24)
GFR, Estimated: >60 mL/min (ref >60)
Glucose, Bld: 66 mg/dL — ABNORMAL LOW (ref 70–99)
Potassium: 3.1 mmol/L — ABNORMAL LOW (ref 3.5–5.1)
Sodium: 130 mmol/L — ABNORMAL LOW (ref 135–145)
Total Bilirubin: 1.2 mg/dL (ref 0.0–1.2)
Total Protein: 6.7 g/dL (ref 6.5–8.1)

## 2023-03-02 LAB — GASTROINTESTINAL PANEL BY PCR, STOOL (REPLACES STOOL CULTURE)

## 2023-03-02 LAB — PROTIME-INR
INR: 1.1 (ref 0.8–1.2)
Prothrombin Time: 14 s (ref 11.4–15.2)

## 2023-03-02 LAB — PHOSPHORUS: Phosphorus: 2.2 mg/dL — ABNORMAL LOW (ref 2.5–4.6)

## 2023-03-02 LAB — TRIGLYCERIDES: Triglycerides: 89 mg/dL (ref <150)

## 2023-03-02 LAB — MAGNESIUM: Magnesium: 1.4 mg/dL — ABNORMAL LOW (ref 1.7–2.4)

## 2023-03-02 LAB — OSMOLALITY: Osmolality: 282 mosm/kg (ref 275–295)

## 2023-03-02 MED ORDER — CHLORDIAZEPOXIDE HCL 25 MG PO CAPS
25.0000 mg | ORAL_CAPSULE | ORAL | Status: DC
  Administered 2023-03-04: 25 mg via ORAL
  Filled 2023-03-02: qty 1

## 2023-03-02 MED ORDER — MAGNESIUM SULFATE 2 GM/50ML IV SOLN
2.0000 g | Freq: Once | INTRAVENOUS | Status: AC
  Administered 2023-03-02: 2 g via INTRAVENOUS
  Filled 2023-03-02: qty 50

## 2023-03-02 MED ORDER — CHLORDIAZEPOXIDE HCL 25 MG PO CAPS: 25.0000 mg | ORAL_CAPSULE | Freq: Every day | ORAL | Status: DC

## 2023-03-02 MED ORDER — CHLORDIAZEPOXIDE HCL 25 MG PO CAPS
25.0000 mg | ORAL_CAPSULE | Freq: Three times a day (TID) | ORAL | Status: AC
  Administered 2023-03-03 (×3): 25 mg via ORAL
  Filled 2023-03-02 (×3): qty 1

## 2023-03-02 MED ORDER — OXYCODONE HCL 5 MG PO TABS
5.0000 mg | ORAL_TABLET | ORAL | Status: DC | PRN
  Administered 2023-03-02 – 2023-03-03 (×3): 10 mg via ORAL
  Filled 2023-03-02 (×3): qty 2

## 2023-03-02 MED ORDER — CHLORDIAZEPOXIDE HCL 25 MG PO CAPS
25.0000 mg | ORAL_CAPSULE | Freq: Four times a day (QID) | ORAL | Status: AC
  Administered 2023-03-02 (×4): 25 mg via ORAL
  Filled 2023-03-02 (×4): qty 1

## 2023-03-02 MED ORDER — POTASSIUM CHLORIDE CRYS ER 20 MEQ PO TBCR
40.0000 meq | EXTENDED_RELEASE_TABLET | Freq: Once | ORAL | Status: AC
Start: 2023-03-02 — End: 2023-03-02
  Administered 2023-03-02: 40 meq via ORAL
  Filled 2023-03-02: qty 2

## 2023-03-02 NOTE — Progress Notes (Signed)
 PROGRESS NOTE    Samuel Blevins  FMW:[id number] DOB: 1966-02-06 DOA: 03/01/2023 PCP: Brien Belvie BRAVO, MD   Brief Narrative: Samuel Blevins is a 58 y.o. 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 difficile colitis, hypertension.  Patient presented secondary to severe abdominal pain, nausea, vomiting, diarrhea was found to have evidence of acute pancreatitis in addition to C. difficile colitis.  Patient started on bowel rest and fidaxomicin .  CIWA for history of alcohol abuse.   Assessment and Plan:  Acute on chronic pancreatitis Likely alcohol related.  Lipase of 375 on admission.  Patient made n.p.o. for bowel rest.  Patient with improved pain management and resolution of nausea and vomiting. -Advance to clear liquid diet -Transition from Dilaudid  IV to oxycodone  p.o.  Alcohol abuse Per history, patient drinks at least 750 mL of liquor per day.  AST elevated. Patient started on CIWA protocol.  No alcohol level obtained on admission. -Continue CIWA -Start Librium  with taper  AKI Baseline creatinine is about 0.7.  Creatinine of 1.29 on admission.  Secondary to acute vomiting and diarrhea.  No hydronephrosis noted on CT imaging.  Patient started on IV fluids for management.  Creatinine improved to 1.02.  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  C. difficile antigen positive with negative toxin; PCR positive.  Patient started on fidaxomicin . -Continue fidaxomicin  x 10 days  Hyponatremia Unclear etiology. Possibly related to low solute intake. -Check urine sodium/osmolality -Check serum osmolality  Hypokalemia Severe on admission with potassium of 2.4 on admission. Improved to 3.1 with supplementation. -Continue potassium supplementation  Hypomagnesemia Magnesium  of 1.0 on admission with improvement to 1.4 today. -Magnesium  supplementation -Recheck magnesium  in  AM  Hypoglycemia Mild. In setting of poor oral intake, but patient also has chronic pancreatitis.  -Advance diet -Check CBGs  Chronic anemia Hemoglobin baseline appears to be around 7.5-8.5. Hemoglobin of 8.8 g/dL on admission with drop to 7.3 g/dL after IV fluids. No obvious evidence of hemorrhaging at this time; vomiting but no history of hemoptysis; no history of varices. -Trend CBC   DVT prophylaxis: SCDs Code Status:   Code Status: Full Code Family Communication: None at bedside Disposition Plan: Discharge home likely in 3-5 days pending improvement of electrolytes, pancreatitis and improvement of colitis   Consultants:  None  Procedures:  None  Antimicrobials: None    Subjective: Patient reports being hungry. Still with some abdominal pain but it has improved. Nausea and vomiting has improved.  Objective: BP 102/68   Pulse (!) 105   Temp 98.3 F (36.8 C) (Oral)   Resp 15   SpO2 96%   Examination:  General exam: Appears calm and comfortable Respiratory system: Clear to auscultation. Respiratory effort normal. Cardiovascular system: S1 & S2 heard, RRR. No murmurs, rubs, gallops or clicks. Gastrointestinal system: Abdomen is nondistended, soft and tender mostly in lower quadrants. Normal bowel sounds heard. Central nervous system: Alert and oriented. No focal neurological deficits. Musculoskeletal: No edema. No calf tenderness Psychiatry: Judgement and insight appear normal. Mood & affect appropriate.    Data Reviewed: I have personally reviewed following labs and imaging studies  CBC Lab Results  Component Value Date   WBC 7.5 03/02/2023   RBC 2.45 (L) 03/02/2023   HGB 7.3 (L) 03/02/2023   HCT 21.6 (L) 03/02/2023   MCV 88.2 03/02/2023   MCH 29.8 03/02/2023   PLT 185 03/02/2023   MCHC 33.8 03/02/2023   RDW  14.3 03/02/2023   LYMPHSABS 1.4 03/01/2023   MONOABS 0.4 03/01/2023   EOSABS 0.0 03/01/2023   BASOSABS 0.0 03/01/2023     Last metabolic  panel Lab Results  Component Value Date   NA 130 (L) 03/02/2023   K 3.1 (L) 03/02/2023   CL 93 (L) 03/02/2023   CO2 21 (L) 03/02/2023   BUN 21 (H) 03/02/2023   CREATININE 1.02 03/02/2023   GLUCOSE 66 (L) 03/02/2023   GFRNONAA >60 03/02/2023   GFRAA 119 12/28/2019   CALCIUM 8.1 (L) 03/02/2023   PHOS 2.2 (L) 03/02/2023   PROT 6.7 03/02/2023   ALBUMIN  2.6 (L) 03/02/2023   LABGLOB 4.2 02/14/2022   AGRATIO 1.1 (L) 02/14/2022   BILITOT 1.2 03/02/2023   ALKPHOS 117 03/02/2023   AST 52 (H) 03/02/2023   ALT 13 03/02/2023   ANIONGAP 16 (H) 03/02/2023    GFR: CrCl cannot be calculated (Unknown ideal weight.).  Recent Results (from the past 240 hours)  C Difficile Quick Screen w PCR reflex     Status: Abnormal   Collection Time: 03/01/23  4:13 PM   Specimen: Stool  Result Value Ref Range Status   C Diff antigen POSITIVE (A) NEGATIVE Final   C Diff toxin NEGATIVE NEGATIVE Final   C Diff interpretation Results are indeterminate. See PCR results.  Final    Comment: Performed at [facility], [address], [place], [place] [address]  C. Diff by PCR, Reflexed     Status: Abnormal   Collection Time: 03/01/23  4:13 PM  Result Value Ref Range Status   Toxigenic C. Difficile by PCR POSITIVE (A) NEGATIVE Final    Comment: Positive for toxigenic C. difficile with little to no toxin production. Only treat if clinical presentation suggests symptomatic illness. Performed at [facility], [address], [place], [place] [address]       Radiology Studies: CT ABDOMEN PELVIS W CONTRAST Result Date: 03/01/2023 CLINICAL DATA:  Acute abdominal pain.  History of pancreatitis. EXAM: CT ABDOMEN AND PELVIS WITH CONTRAST TECHNIQUE: Multidetector CT imaging of the abdomen and pelvis was performed using the standard protocol following bolus administration of intravenous contrast. RADIATION DOSE REDUCTION: This exam was performed according to the departmental dose-optimization program which  includes automated exposure control, adjustment of the mA and/or kV according to patient size and/or use of iterative reconstruction technique. CONTRAST:  75mL OMNIPAQUE  IOHEXOL  350 MG/ML SOLN COMPARISON:  CT abdomen and pelvis 10/08/2022. FINDINGS: Lower chest: No acute abnormality. Hepatobiliary: There is new diffuse fatty infiltration of the liver. Gallbladder and bile ducts are within normal limits. Pancreas: There is trace inflammatory stranding surrounding the head of the pancreas. Pancreatic body and tail are without inflammation. Pancreatic stent remains in place. No pancreatic ductal dilatation. Pancreatic head calcifications are again noted. No pancreatic fluid collections are identified. Spleen: Normal in size without focal abnormality. Adrenals/Urinary Tract: Adrenal glands are unremarkable. Kidneys are normal, without renal calculi, focal lesion, or hydronephrosis. Bladder is unremarkable. Stomach/Bowel: There is mild diffuse colonic wall thickening without surrounding inflammation. There is no evidence for bowel obstruction, pneumatosis or free air. The appendix is not seen. There is a small amount of fluid in the distal esophagus which can be seen with gastroesophageal reflux. The stomach and small bowel loops appear within normal limits. Vascular/Lymphatic: Aortic atherosclerosis. No enlarged abdominal or pelvic lymph nodes. Reproductive: There is nodular protrusion of the prostate into the bladder base. The prostate is nonenlarged. Findings are similar to prior. Other:  No abdominal wall hernia or abnormality. No abdominopelvic ascites. Musculoskeletal: There are degenerative changes at L5-S1. IMPRESSION: 1. Trace inflammatory stranding surrounding the head of the pancreas compatible with acute pancreatitis. No fluid collections. 2. Pancreatic stent remains in place. 3. Mild diffuse colonic wall thickening compatible with colitis. 4. New diffuse fatty infiltration of the liver. 5. Small amount of  fluid in the distal esophagus which can be seen with gastroesophageal reflux. 6. Aortic atherosclerosis. 7. Stable nodular protrusion of the prostate into the bladder base. Recommend clinical correlation and follow-up. Aortic Atherosclerosis (ICD10-I70.0). Electronically Signed   By: Greig Pique M.D.   On: 03/01/2023 19:04      LOS: 1 day    Elgin Lam, MD [facility] 03/02/2023, 11:36 AM   If 7PM-7AM, please contact night-coverage [email or web address]

## 2023-03-02 NOTE — Hospital Course (Addendum)
 Samuel Blevins is a 58 y.o. 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 difficile colitis, hypertension.  Patient presented secondary to severe abdominal pain, nausea, vomiting, diarrhea was found to have evidence of acute pancreatitis in addition to C. difficile colitis.  Patient started on bowel rest and fidaxomicin .  CIWA for history of alcohol abuse.  Patient ended up leaving AGAINST MEDICAL ADVICE.

## 2023-03-02 NOTE — Plan of Care (Signed)

## 2023-03-03 ENCOUNTER — Telehealth (HOSPITAL_COMMUNITY): Payer: Self-pay | Admitting: Pharmacy Technician

## 2023-03-03 ENCOUNTER — Other Ambulatory Visit (HOSPITAL_COMMUNITY): Payer: Self-pay

## 2023-03-03 DIAGNOSIS — E876 Hypokalemia: Secondary | ICD-10-CM | POA: Diagnosis not present

## 2023-03-03 DIAGNOSIS — D649 Anemia, unspecified: Secondary | ICD-10-CM | POA: Diagnosis not present

## 2023-03-03 DIAGNOSIS — K859 Acute pancreatitis without necrosis or infection, unspecified: Secondary | ICD-10-CM | POA: Diagnosis not present

## 2023-03-03 DIAGNOSIS — N179 Acute kidney failure, unspecified: Secondary | ICD-10-CM | POA: Diagnosis not present

## 2023-03-03 LAB — CBC
HCT: 22.5 % — ABNORMAL LOW (ref 39.0–52.0)
Hemoglobin: 7.7 g/dL — ABNORMAL LOW (ref 13.0–17.0)
MCH: 30.3 pg (ref 26.0–34.0)
MCHC: 34.2 g/dL (ref 30.0–36.0)
MCV: 88.6 fL (ref 80.0–100.0)
Platelets: 196 10*3/uL (ref 150–400)
RBC: 2.54 MIL/uL — ABNORMAL LOW (ref 4.22–5.81)
RDW: 14.6 % (ref 11.5–15.5)
WBC: 7.9 10*3/uL (ref 4.0–10.5)
nRBC: 0 % (ref 0.0–0.2)

## 2023-03-03 LAB — PHOSPHORUS: Phosphorus: 1.3 mg/dL — ABNORMAL LOW (ref 2.5–4.6)

## 2023-03-03 LAB — COMPREHENSIVE METABOLIC PANEL
ALT: 15 U/L (ref 0–44)
AST: 85 U/L — ABNORMAL HIGH (ref 15–41)
Albumin: 2.5 g/dL — ABNORMAL LOW (ref 3.5–5.0)
Alkaline Phosphatase: 136 U/L — ABNORMAL HIGH (ref 38–126)
Anion gap: 10 (ref 5–15)
BUN: 5 mg/dL — ABNORMAL LOW (ref 6–20)
CO2: 28 mmol/L (ref 22–32)
Calcium: 8.5 mg/dL — ABNORMAL LOW (ref 8.9–10.3)
Chloride: 95 mmol/L — ABNORMAL LOW (ref 98–111)
Creatinine, Ser: 0.59 mg/dL — ABNORMAL LOW (ref 0.61–1.24)
GFR, Estimated: >60 mL/min (ref >60)
Glucose, Bld: 91 mg/dL (ref 70–99)
Potassium: 3.6 mmol/L (ref 3.5–5.1)
Sodium: 133 mmol/L — ABNORMAL LOW (ref 135–145)
Total Bilirubin: 0.9 mg/dL (ref 0.0–1.2)
Total Protein: 6.6 g/dL (ref 6.5–8.1)

## 2023-03-03 LAB — MAGNESIUM: Magnesium: 1.2 mg/dL — ABNORMAL LOW (ref 1.7–2.4)

## 2023-03-03 MED ORDER — POTASSIUM & SODIUM PHOSPHATES 280-160-250 MG PO PACK
1.0000 | PACK | Freq: Three times a day (TID) | ORAL | Status: DC
  Administered 2023-03-03 (×2): 1 via ORAL
  Filled 2023-03-03 (×4): qty 1

## 2023-03-03 MED ORDER — THIAMINE MONONITRATE 100 MG PO TABS
100.0000 mg | ORAL_TABLET | Freq: Every day | ORAL | Status: DC
  Administered 2023-03-04: 100 mg via ORAL
  Filled 2023-03-03: qty 1

## 2023-03-03 MED ORDER — MAGNESIUM SULFATE 2 GM/50ML IV SOLN: 2.0000 g | Freq: Once | INTRAVENOUS | Status: DC

## 2023-03-03 MED ORDER — MAGNESIUM SULFATE 4 GM/100ML IV SOLN
4.0000 g | Freq: Once | INTRAVENOUS | Status: AC
  Administered 2023-03-03: 4 g via INTRAVENOUS
  Filled 2023-03-03: qty 100

## 2023-03-03 NOTE — Plan of Care (Signed)

## 2023-03-03 NOTE — Telephone Encounter (Signed)
 Pharmacy Patient Advocate Encounter   Received notification that prior authorization for Dificid  200MG  tabletsis required/requested.   Insurance verification completed.   The patient is insured through [facility] .   Per test claim: PA required; PA submitted to above mentioned insurance via CoverMyMeds Key/confirmation #/EOC [id number] Status is pending

## 2023-03-03 NOTE — Telephone Encounter (Signed)
 Patient Product/process Development Scientist completed.    The patient is insured through [employer] [place].     Ran test claim for Dificid  200 mg and Requires Prior Authorization   This test claim was processed through [facility]- copay amounts may vary at other pharmacies due to [employer], or as the patient moves through the different stages of their insurance plan.     Reyes Sharps, CPHT Pharmacy Technician III Certified Patient Advocate [facility] Pharmacy Patient Advocate Team Direct Number: [phone]  Fax: [phone]

## 2023-03-03 NOTE — Telephone Encounter (Signed)
 Pharmacy Patient Advocate Encounter  Received notification from [facility] that Prior Authorization for Dificid  200MG  tablets  has been APPROVED from 03/03/2023 to 03/02/2024. Ran test claim, Copay is $4.00. This test claim was processed through [facility]- copay amounts may vary at other pharmacies due to pharmacy/plan contracts, or as the patient moves through the different stages of their insurance plan.   PA #/Case ID/Reference #: [id number]

## 2023-03-03 NOTE — Progress Notes (Signed)
 PROGRESS NOTE    Samuel Blevins  FMW:[id number] DOB: 01-29-1966 DOA: 03/01/2023 PCP: Brien Belvie BRAVO, MD   Brief Narrative: Samuel Blevins is a 58 y.o. 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 difficile colitis, hypertension.  Patient presented secondary to severe abdominal pain, nausea, vomiting, diarrhea was found to have evidence of acute pancreatitis in addition to C. difficile colitis.  Patient started on bowel rest and fidaxomicin .  CIWA for history of alcohol abuse.   Assessment and Plan:  Acute on chronic pancreatitis Likely alcohol related.  Lipase of 375 on admission.  Patient made n.p.o. for bowel rest.  Patient with improved pain management and resolution of nausea and vomiting. -Advance to soft diet -Continue oxycodone  p.o.  Alcohol abuse Per history, patient drinks at least 750 mL of liquor per day.  AST elevated. Patient started on CIWA protocol.  No alcohol level obtained on admission. -Continue CIWA -Continue Librium  with taper  AKI Baseline creatinine is about 0.7.  Creatinine of 1.29 on admission.  Secondary to acute vomiting and diarrhea.  No hydronephrosis noted on CT imaging.  Patient started on IV fluids for management.  Creatinine improved to 1.02.  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  C. difficile antigen positive with negative toxin; PCR positive.  Patient started on fidaxomicin . -Continue fidaxomicin  x 10 days  Hyponatremia Unclear etiology. Possibly related to low solute intake. Normal serum osmolality. Improved. -Urine sodium/osmolality still pending  Hypokalemia Severe on admission with potassium of 2.4 on admission. Improved to 3.1 with supplementation. -Continue potassium supplementation  Hypomagnesemia Magnesium  of 1.0 on admission with improvement to 1.2 today. -Magnesium  supplementation -Recheck magnesium  in  AM  Hypophosphatemia -phosphorus supplementation  Hypoglycemia Mild. In setting of poor oral intake, but patient also has chronic pancreatitis. Improved with advancing diet. -Advance diet -Check CBGs  Chronic anemia Hemoglobin baseline appears to be around 7.5-8.5. Hemoglobin of 8.8 g/dL on admission with drop to 7.3 g/dL after IV fluids. No obvious evidence of hemorrhaging at this time; vomiting but no history of hemoptysis; no history of varices. -Trend CBC   DVT prophylaxis: SCDs Code Status:   Code Status: Full Code Family Communication: None at bedside Disposition Plan: Discharge home likely in 1-3 days pending improvement of electrolytes, pancreatitis and improvement of colitis   Consultants:  None  Procedures:  None  Antimicrobials: Fidaxomicin     Subjective: Hungry. Wants diet advanced. States abdominal pain is improved.  Objective: BP (!) 125/91 (BP Location: Right Arm)   Pulse 97   Temp 98.8 F (37.1 C) (Oral)   Resp 18   Ht 5' 9 (1.753 m)   Wt 66.7 kg   SpO2 100%   BMI 21.72 kg/m   Examination:  General exam: Appears calm and comfortable Respiratory system: Clear to auscultation. Respiratory effort normal. Cardiovascular system: S1 & S2 heard, RRR. Gastrointestinal system: Abdomen is non-distended, soft and nontender. Normal bowel sounds heard. Central nervous system: Alert and oriented. No focal neurological deficits. Musculoskeletal: No edema. No calf tenderness Psychiatry: Judgement and insight appear normal. Mood & affect appropriate.    Data Reviewed: I have personally reviewed following labs and imaging studies  CBC Lab Results  Component Value Date   WBC 7.9 03/03/2023   RBC 2.54 (L) 03/03/2023   HGB 7.7 (L) 03/03/2023   HCT 22.5 (L) 03/03/2023   MCV 88.6 03/03/2023   MCH 30.3 03/03/2023   PLT 196 03/03/2023  MCHC 34.2 03/03/2023   RDW 14.6 03/03/2023   LYMPHSABS 1.4 03/01/2023   MONOABS 0.4 03/01/2023   EOSABS 0.0  03/01/2023   BASOSABS 0.0 03/01/2023     Last metabolic panel Lab Results  Component Value Date   NA 133 (L) 03/03/2023   K 3.6 03/03/2023   CL 95 (L) 03/03/2023   CO2 28 03/03/2023   BUN 5 (L) 03/03/2023   CREATININE 0.59 (L) 03/03/2023   GLUCOSE 91 03/03/2023   GFRNONAA >60 03/03/2023   GFRAA 119 12/28/2019   CALCIUM 8.5 (L) 03/03/2023   PHOS 1.3 (L) 03/03/2023   PROT 6.6 03/03/2023   ALBUMIN  2.5 (L) 03/03/2023   LABGLOB 4.2 02/14/2022   AGRATIO 1.1 (L) 02/14/2022   BILITOT 0.9 03/03/2023   ALKPHOS 136 (H) 03/03/2023   AST 85 (H) 03/03/2023   ALT 15 03/03/2023   ANIONGAP 10 03/03/2023    GFR: Estimated Creatinine Clearance: 95 mL/min (A) (by C-G formula based on SCr of 0.59 mg/dL (L)).  Recent Results (from the past 240 hours)  Gastrointestinal Panel by PCR , Stool     Status: Abnormal   Collection Time: 03/01/23  4:13 PM   Specimen: Stool  Result Value Ref Range Status   Campylobacter species NOT DETECTED NOT DETECTED Final   Plesimonas shigelloides NOT DETECTED NOT DETECTED Final   Salmonella species NOT DETECTED NOT DETECTED Final   Yersinia enterocolitica NOT DETECTED NOT DETECTED Final   Vibrio species NOT DETECTED NOT DETECTED Final   Vibrio cholerae NOT DETECTED NOT DETECTED Final   Enteroaggregative E coli (EAEC) NOT DETECTED NOT DETECTED Final   Enteropathogenic E coli (EPEC) NOT DETECTED NOT DETECTED Final   Enterotoxigenic E coli (ETEC) NOT DETECTED NOT DETECTED Final   Shiga like toxin producing E coli (STEC) NOT DETECTED NOT DETECTED Final   Shigella/Enteroinvasive E coli (EIEC) NOT DETECTED NOT DETECTED Final   Cryptosporidium NOT DETECTED NOT DETECTED Final   Cyclospora cayetanensis NOT DETECTED NOT DETECTED Final   Entamoeba histolytica NOT DETECTED NOT DETECTED Final   Giardia lamblia NOT DETECTED NOT DETECTED Final   Adenovirus F40/41 NOT DETECTED NOT DETECTED Final   Astrovirus NOT DETECTED NOT DETECTED Final   Norovirus GI/GII DETECTED (A)  NOT DETECTED Final    Comment: RESULT CALLED TO, READ BACK BY AND VERIFIED WITH: LADONNA WATTLEY 03/02/2023 AT 1344 SRR    Rotavirus A NOT DETECTED NOT DETECTED Final   Sapovirus (I, II, IV, and V) NOT DETECTED NOT DETECTED Final    Comment: Performed at [facility], [address] [place], [place] [address]  C Difficile Quick Screen w PCR reflex     Status: Abnormal   Collection Time: 03/01/23  4:13 PM   Specimen: Stool  Result Value Ref Range Status   C Diff antigen POSITIVE (A) NEGATIVE Final   C Diff toxin NEGATIVE NEGATIVE Final   C Diff interpretation Results are indeterminate. See PCR results.  Final    Comment: Performed at [facility], [address] [place], [place] [address]  C. Diff by PCR, Reflexed     Status: Abnormal   Collection Time: 03/01/23  4:13 PM  Result Value Ref Range Status   Toxigenic C. Difficile by PCR POSITIVE (A) NEGATIVE Final    Comment: Positive for toxigenic C. difficile with little to no toxin production. Only treat if clinical presentation suggests symptomatic illness. Performed at [facility], [address], [place], [place] [address]  Radiology Studies: CT ABDOMEN PELVIS W CONTRAST Result Date: 03/01/2023 CLINICAL DATA:  Acute abdominal pain.  History of pancreatitis. EXAM: CT ABDOMEN AND PELVIS WITH CONTRAST TECHNIQUE: Multidetector CT imaging of the abdomen and pelvis was performed using the standard protocol following bolus administration of intravenous contrast. RADIATION DOSE REDUCTION: This exam was performed according to the departmental dose-optimization program which includes automated exposure control, adjustment of the mA and/or kV according to patient size and/or use of iterative reconstruction technique. CONTRAST:  75mL OMNIPAQUE  IOHEXOL  350 MG/ML SOLN COMPARISON:  CT abdomen and pelvis 10/08/2022. FINDINGS: Lower chest: No acute abnormality. Hepatobiliary: There is new diffuse fatty infiltration of the  liver. Gallbladder and bile ducts are within normal limits. Pancreas: There is trace inflammatory stranding surrounding the head of the pancreas. Pancreatic body and tail are without inflammation. Pancreatic stent remains in place. No pancreatic ductal dilatation. Pancreatic head calcifications are again noted. No pancreatic fluid collections are identified. Spleen: Normal in size without focal abnormality. Adrenals/Urinary Tract: Adrenal glands are unremarkable. Kidneys are normal, without renal calculi, focal lesion, or hydronephrosis. Bladder is unremarkable. Stomach/Bowel: There is mild diffuse colonic wall thickening without surrounding inflammation. There is no evidence for bowel obstruction, pneumatosis or free air. The appendix is not seen. There is a small amount of fluid in the distal esophagus which can be seen with gastroesophageal reflux. The stomach and small bowel loops appear within normal limits. Vascular/Lymphatic: Aortic atherosclerosis. No enlarged abdominal or pelvic lymph nodes. Reproductive: There is nodular protrusion of the prostate into the bladder base. The prostate is nonenlarged. Findings are similar to prior. Other: No abdominal wall hernia or abnormality. No abdominopelvic ascites. Musculoskeletal: There are degenerative changes at L5-S1. IMPRESSION: 1. Trace inflammatory stranding surrounding the head of the pancreas compatible with acute pancreatitis. No fluid collections. 2. Pancreatic stent remains in place. 3. Mild diffuse colonic wall thickening compatible with colitis. 4. New diffuse fatty infiltration of the liver. 5. Small amount of fluid in the distal esophagus which can be seen with gastroesophageal reflux. 6. Aortic atherosclerosis. 7. Stable nodular protrusion of the prostate into the bladder base. Recommend clinical correlation and follow-up. Aortic Atherosclerosis (ICD10-I70.0). Electronically Signed   By: Greig Pique M.D.   On: 03/01/2023 19:04      LOS: 2 days     Elgin Lam, MD [facility] 03/03/2023, 12:53 PM   If 7PM-7AM, please contact night-coverage [email or web address]

## 2023-03-04 DIAGNOSIS — K859 Acute pancreatitis without necrosis or infection, unspecified: Secondary | ICD-10-CM | POA: Diagnosis not present

## 2023-03-04 DIAGNOSIS — K861 Other chronic pancreatitis: Secondary | ICD-10-CM | POA: Diagnosis not present

## 2023-03-04 LAB — CBC
HCT: 21 % — ABNORMAL LOW (ref 39.0–52.0)
Hemoglobin: 7.2 g/dL — ABNORMAL LOW (ref 13.0–17.0)
MCH: 29.6 pg (ref 26.0–34.0)
MCHC: 34.3 g/dL (ref 30.0–36.0)
MCV: 86.4 fL (ref 80.0–100.0)
Platelets: 189 10*3/uL (ref 150–400)
RBC: 2.43 MIL/uL — ABNORMAL LOW (ref 4.22–5.81)
RDW: 14.3 % (ref 11.5–15.5)
WBC: 5.3 10*3/uL (ref 4.0–10.5)
nRBC: 0 % (ref 0.0–0.2)

## 2023-03-04 LAB — COMPREHENSIVE METABOLIC PANEL
ALT: 61 U/L — ABNORMAL HIGH (ref 0–44)
AST: 498 U/L — ABNORMAL HIGH (ref 15–41)
Albumin: 2.3 g/dL — ABNORMAL LOW (ref 3.5–5.0)
Alkaline Phosphatase: 275 U/L — ABNORMAL HIGH (ref 38–126)
Anion gap: 11 (ref 5–15)
BUN: <5 mg/dL — ABNORMAL LOW (ref 6–20)
CO2: 28 mmol/L (ref 22–32)
Calcium: 8.7 mg/dL — ABNORMAL LOW (ref 8.9–10.3)
Chloride: 93 mmol/L — ABNORMAL LOW (ref 98–111)
Creatinine, Ser: 0.62 mg/dL (ref 0.61–1.24)
GFR, Estimated: >60 mL/min (ref >60)
Glucose, Bld: 116 mg/dL — ABNORMAL HIGH (ref 70–99)
Potassium: 3 mmol/L — ABNORMAL LOW (ref 3.5–5.1)
Sodium: 132 mmol/L — ABNORMAL LOW (ref 135–145)
Total Bilirubin: 0.9 mg/dL (ref 0.0–1.2)
Total Protein: 6.3 g/dL — ABNORMAL LOW (ref 6.5–8.1)

## 2023-03-04 LAB — PROTIME-INR
INR: 1.1 (ref 0.8–1.2)
Prothrombin Time: 13.9 s (ref 11.4–15.2)

## 2023-03-04 LAB — MAGNESIUM: Magnesium: 1.3 mg/dL — ABNORMAL LOW (ref 1.7–2.4)

## 2023-03-04 LAB — PHOSPHORUS: Phosphorus: 1.7 mg/dL — ABNORMAL LOW (ref 2.5–4.6)

## 2023-03-04 MED ORDER — MAGNESIUM SULFATE 4 GM/100ML IV SOLN
4.0000 g | Freq: Once | INTRAVENOUS | Status: AC
  Administered 2023-03-04: 4 g via INTRAVENOUS
  Filled 2023-03-04: qty 100

## 2023-03-04 MED ORDER — POTASSIUM CHLORIDE CRYS ER 20 MEQ PO TBCR
40.0000 meq | EXTENDED_RELEASE_TABLET | ORAL | Status: DC
  Administered 2023-03-04: 40 meq via ORAL
  Filled 2023-03-04: qty 2

## 2023-03-04 MED ORDER — POTASSIUM & SODIUM PHOSPHATES 280-160-250 MG PO PACK
1.0000 | PACK | Freq: Three times a day (TID) | ORAL | Status: DC
Start: 2023-03-04 — End: 2023-03-04
  Filled 2023-03-04: qty 1

## 2023-03-04 NOTE — Discharge Summary (Signed)
 Physician Discharge Summary   Patient: Samuel Blevins MRN: [id number] DOB: November 26, 1965  Admit date:     03/01/2023  Discharge date (Left AMA): 03/04/23  Discharge Physician: Elgin Lam, MD   PCP: Brien Belvie BRAVO, MD   Recommendations at discharge:  Patient left AMA  Discharge Diagnoses: Principal Problem:   Acute on chronic pancreatitis [facility]) Active Problems:   Chronic alcohol abuse   AKI (acute kidney injury) (HCC)   Chronic anemia   Essential hypertension   Hypokalemia   Hypomagnesemia   Hyponatremia   Diarrhea  Resolved Problems:   * No resolved hospital problems. *  Hospital Course: [name] is a 58 y.o. 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 difficile colitis, hypertension.  Patient presented secondary to severe abdominal pain, nausea, vomiting, diarrhea was found to have evidence of acute pancreatitis in addition to C. difficile colitis.  Patient started on bowel rest and fidaxomicin .  CIWA for history of alcohol abuse.  Patient ended up leaving AGAINST MEDICAL ADVICE.  Assessment and Plan:  Acute on chronic pancreatitis Likely alcohol related.  Lipase of 375 on admission.  Patient made n.p.o. for bowel rest.  Patient with improved pain management and resolution of nausea and vomiting.  Diet advanced to soft diet.   Alcohol abuse Per history, patient drinks at least 750 mL of liquor per day.  AST elevated. Patient started on CIWA protocol.  No alcohol level obtained on admission.  Patient managed on Librium  taper.   AKI Baseline creatinine is about 0.7.  Creatinine of 1.29 on admission.  Secondary to acute vomiting and diarrhea.  No hydronephrosis noted on CT imaging.  Patient started on IV fluids for management.  Creatinine improved to baseline.   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  C. difficile antigen positive with negative toxin; PCR positive.   Patient started on fidaxomicin .  Patient left AGAINST MEDICAL ADVICE prior to receiving prescription for discharge.   Hyponatremia Unclear etiology. Possibly related to low solute intake. Normal serum osmolality. Improved. -Urine sodium/osmolality still pending   Hypokalemia Severe on admission with potassium of 2.4 on admission. Improved to 3.1 with supplementation, however patient with recurrent hypokalemia.  Patient left AGAINST MEDICAL ADVICE.   Hypomagnesemia Magnesium  of 1.0 on admission with improvement to 1.3 today with multiple rounds of supplementation.  Patient left AGAINST MEDICAL ADVICE.   Hypophosphatemia Improved with phosphorus supplementation but not back to normal limits.  Patient left AGAINST MEDICAL ADVICE.  Elevated AST/ALT Elevated alkaline phosphatase CT abdomen/pelvis obtained on admission without evidence of acute cholecystitis.  AST/ALT worsening during admission along with alkaline phosphatase.  Plan was to obtain right upper quadrant ultrasound, however patient left AGAINST MEDICAL ADVICE before continued workup could be pursued.   Hypoglycemia Mild. In setting of poor oral intake, but patient also has chronic pancreatitis. Improved with advancing diet.  Resolved.   Chronic anemia Hemoglobin baseline appears to be around 7.5-8.5. Hemoglobin of 8.8 g/dL on admission with drop to 7.3 g/dL after IV fluids. No obvious evidence of hemorrhaging at this time; vomiting but no history of hemoptysis; no history of varices.  Consultants: None Procedures performed: None Disposition: Patient left AGAINST MEDICAL ADVICE Diet recommendation: Soft diet  DISCHARGE MEDICATION:  Patient left AGAINST MEDICAL ADVICE.  Discharge Exam: BP 116/76 (BP Location: Right Arm)   Pulse (!) 102   Temp 97.8 F (36.6 C) (Oral)   Resp 18   Ht 5'  9 (1.753 m)   Wt 69.7 kg   SpO2 98%   BMI 22.69 kg/m   General exam: Appears calm and comfortable Respiratory system: Respiratory  effort normal. Gastrointestinal system: Abdomen is nondistended, soft and nontender. Central nervous system: Alert and oriented.  Musculoskeletal: No edema. No calf tenderness Skin: No cyanosis. No rashes   Condition at discharge: fair  The results of significant diagnostics from this hospitalization (including imaging, microbiology, ancillary and laboratory) are listed below for reference.   Imaging Studies: CT ABDOMEN PELVIS W CONTRAST Result Date: 03/01/2023 CLINICAL DATA:  Acute abdominal pain.  History of pancreatitis. EXAM: CT ABDOMEN AND PELVIS WITH CONTRAST TECHNIQUE: Multidetector CT imaging of the abdomen and pelvis was performed using the standard protocol following bolus administration of intravenous contrast. RADIATION DOSE REDUCTION: This exam was performed according to the departmental dose-optimization program which includes automated exposure control, adjustment of the mA and/or kV according to patient size and/or use of iterative reconstruction technique. CONTRAST:  75mL OMNIPAQUE  IOHEXOL  350 MG/ML SOLN COMPARISON:  CT abdomen and pelvis 10/08/2022. FINDINGS: Lower chest: No acute abnormality. Hepatobiliary: There is new diffuse fatty infiltration of the liver. Gallbladder and bile ducts are within normal limits. Pancreas: There is trace inflammatory stranding surrounding the head of the pancreas. Pancreatic body and tail are without inflammation. Pancreatic stent remains in place. No pancreatic ductal dilatation. Pancreatic head calcifications are again noted. No pancreatic fluid collections are identified. Spleen: Normal in size without focal abnormality. Adrenals/Urinary Tract: Adrenal glands are unremarkable. Kidneys are normal, without renal calculi, focal lesion, or hydronephrosis. Bladder is unremarkable. Stomach/Bowel: There is mild diffuse colonic wall thickening without surrounding inflammation. There is no evidence for bowel obstruction, pneumatosis or free air. The appendix  is not seen. There is a small amount of fluid in the distal esophagus which can be seen with gastroesophageal reflux. The stomach and small bowel loops appear within normal limits. Vascular/Lymphatic: Aortic atherosclerosis. No enlarged abdominal or pelvic lymph nodes. Reproductive: There is nodular protrusion of the prostate into the bladder base. The prostate is nonenlarged. Findings are similar to prior. Other: No abdominal wall hernia or abnormality. No abdominopelvic ascites. Musculoskeletal: There are degenerative changes at L5-S1. IMPRESSION: 1. Trace inflammatory stranding surrounding the head of the pancreas compatible with acute pancreatitis. No fluid collections. 2. Pancreatic stent remains in place. 3. Mild diffuse colonic wall thickening compatible with colitis. 4. New diffuse fatty infiltration of the liver. 5. Small amount of fluid in the distal esophagus which can be seen with gastroesophageal reflux. 6. Aortic atherosclerosis. 7. Stable nodular protrusion of the prostate into the bladder base. Recommend clinical correlation and follow-up. Aortic Atherosclerosis (ICD10-I70.0). Electronically Signed   By: Greig Pique M.D.   On: 03/01/2023 19:04    Microbiology: Results for orders placed or performed during the hospital encounter of 03/01/23  Gastrointestinal Panel by PCR , Stool     Status: Abnormal   Collection Time: 03/01/23  4:13 PM   Specimen: Stool  Result Value Ref Range Status   Campylobacter species NOT DETECTED NOT DETECTED Final   Plesimonas shigelloides NOT DETECTED NOT DETECTED Final   Salmonella species NOT DETECTED NOT DETECTED Final   Yersinia enterocolitica NOT DETECTED NOT DETECTED Final   Vibrio species NOT DETECTED NOT DETECTED Final   Vibrio cholerae NOT DETECTED NOT DETECTED Final   Enteroaggregative E coli (EAEC) NOT DETECTED NOT DETECTED Final   Enteropathogenic E coli (EPEC) NOT DETECTED NOT DETECTED Final   Enterotoxigenic E coli (ETEC) NOT DETECTED  NOT  DETECTED Final   Shiga like toxin producing E coli (STEC) NOT DETECTED NOT DETECTED Final   Shigella/Enteroinvasive E coli (EIEC) NOT DETECTED NOT DETECTED Final   Cryptosporidium NOT DETECTED NOT DETECTED Final   Cyclospora cayetanensis NOT DETECTED NOT DETECTED Final   Entamoeba histolytica NOT DETECTED NOT DETECTED Final   Giardia lamblia NOT DETECTED NOT DETECTED Final   Adenovirus F40/41 NOT DETECTED NOT DETECTED Final   Astrovirus NOT DETECTED NOT DETECTED Final   Norovirus GI/GII DETECTED (A) NOT DETECTED Final    Comment: RESULT CALLED TO, READ BACK BY AND VERIFIED WITH: LADONNA WATTLEY 03/02/2023 AT 1344 SRR    Rotavirus A NOT DETECTED NOT DETECTED Final   Sapovirus (I, II, IV, and V) NOT DETECTED NOT DETECTED Final    Comment: Performed at [facility], [address] [place], [place] [address]  C Difficile Quick Screen w PCR reflex     Status: Abnormal   Collection Time: 03/01/23  4:13 PM   Specimen: Stool  Result Value Ref Range Status   C Diff antigen POSITIVE (A) NEGATIVE Final   C Diff toxin NEGATIVE NEGATIVE Final   C Diff interpretation Results are indeterminate. See PCR results.  Final    Comment: Performed at [facility], [address] [place], [place] [address]  C. Diff by PCR, Reflexed     Status: Abnormal   Collection Time: 03/01/23  4:13 PM  Result Value Ref Range Status   Toxigenic C. Difficile by PCR POSITIVE (A) NEGATIVE Final    Comment: Positive for toxigenic C. difficile with little to no toxin production. Only treat if clinical presentation suggests symptomatic illness. Performed at [facility], [address], [place], [place] [address]     Labs: CBC: Recent Labs  Lab 03/01/23 1228 03/02/23 0724 03/03/23 0755 03/04/23 0548  WBC 7.8 7.5 7.9 5.3  NEUTROABS 5.9  --   --   --   HGB 8.8* 7.3* 7.7* 7.2*  HCT 25.7* 21.6* 22.5* 21.0*  MCV 88.3 88.2 88.6 86.4  PLT 231 185 196 189   Basic Metabolic Panel: Recent Labs   Lab 03/01/23 1228 03/02/23 0724 03/03/23 0755 03/04/23 0548  NA 131* 130* 133* 132*  K 2.4* 3.1* 3.6 3.0*  CL 87* 93* 95* 93*  CO2 25 21* 28 28  GLUCOSE 86 66* 91 116*  BUN 24* 21* 5* <5*  CREATININE 1.29* 1.02 0.59* 0.62  CALCIUM 8.9 8.1* 8.5* 8.7*  MG 1.0* 1.4* 1.2* 1.3*  PHOS  --  2.2* 1.3* 1.7*   Liver Function Tests: Recent Labs  Lab 03/01/23 1228 03/02/23 0724 03/03/23 0755 03/04/23 0548  AST 78* 52* 85* 498*  ALT 16 13 15  61*  ALKPHOS 150* 117 136* 275*  BILITOT 1.1 1.2 0.9 0.9  PROT 8.5* 6.7 6.6 6.3*  ALBUMIN  3.2* 2.6* 2.5* 2.3*    Discharge time spent: 35 minutes.  Signed: Elgin Lam, MD [facility] 03/04/2023

## 2023-03-04 NOTE — Progress Notes (Signed)
 Patient said he is getting ready to leave AMA when primary nurse was in his room to give his morning medicine. Education provided on risk of leaving AMA and patient verbalize understanding. MD Avie Shown made aware.   Both of his PIV removed and patient walked out.

## 2023-03-05 ENCOUNTER — Telehealth: Payer: Self-pay

## 2023-03-05 NOTE — Transitions of Care (Post Inpatient/ED Visit) (Signed)
   03/05/2023  Name: Samuel Blevins MRN: [id number] DOB: 1965-03-28  Today's TOC FU Call Status: Today's TOC FU Call Status:: Unsuccessful Call (1st Attempt) Unsuccessful Call (1st Attempt) Date: 03/05/23  Attempted to reach the patient regarding the most recent Inpatient/ED visit.  Follow Up Plan: Additional outreach attempts will be made to reach the patient to complete the Transitions of Care (Post Inpatient/ED visit) call.   Patient left the hospital  [facility]  Signature Slater Diesel, RN

## 2023-03-06 ENCOUNTER — Telehealth: Payer: Self-pay

## 2023-03-06 NOTE — Transitions of Care (Post Inpatient/ED Visit) (Signed)
   03/06/2023  Name: Samuel Blevins MRN: [id number] DOB: 12-21-1965  Today's TOC FU Call Status: Today's TOC FU Call Status:: Unsuccessful Call (2nd Attempt) Unsuccessful Call (1st Attempt) Date: 03/05/23 Unsuccessful Call (2nd Attempt) Date: 03/06/23  Attempted to reach the patient regarding the most recent Inpatient/ED visit.  Follow Up Plan: Additional outreach attempts will be made to reach the patient to complete the Transitions of Care (Post Inpatient/ED visit) call.    The patient left the hospital [facility] Signature Slater Diesel, RN

## 2023-03-10 ENCOUNTER — Telehealth: Payer: Self-pay

## 2023-03-10 NOTE — Transitions of Care (Post Inpatient/ED Visit) (Signed)
   03/10/2023  Name: Darren Caldron MRN: [id number] DOB: 05-17-1965  Today's TOC FU Call Status: Today's TOC FU Call Status:: Unsuccessful Call (3rd Attempt) Unsuccessful Call (1st Attempt) Date: 03/05/23 Unsuccessful Call (2nd Attempt) Date: 03/06/23 Unsuccessful Call (3rd Attempt) Date: 03/10/23  Attempted to reach the patient regarding the most recent Inpatient/ED visit.  Follow Up Plan: No further outreach attempts will be made at this time. We have been unable to contact the patient.  I left a voicemail message for Madison Silvan, RN/ Congregational Nurse at [facility] requesting a call back so we can schedule a hospital follow up appointment.   Signature Slater Diesel, RN

## 2023-03-18 IMAGING — DX DG CHEST 1V PORT
1 series · 2 of 2 positions shown · non-contrast
Comparison: Chest radiograph dated 11/22/2015.

CLINICAL DATA: Chest pain.

EXAM:
PORTABLE CHEST 1 VIEW

[Series 1: chest · 0.14mm/px · 2 of 2 slices shown]
[im 1/2]
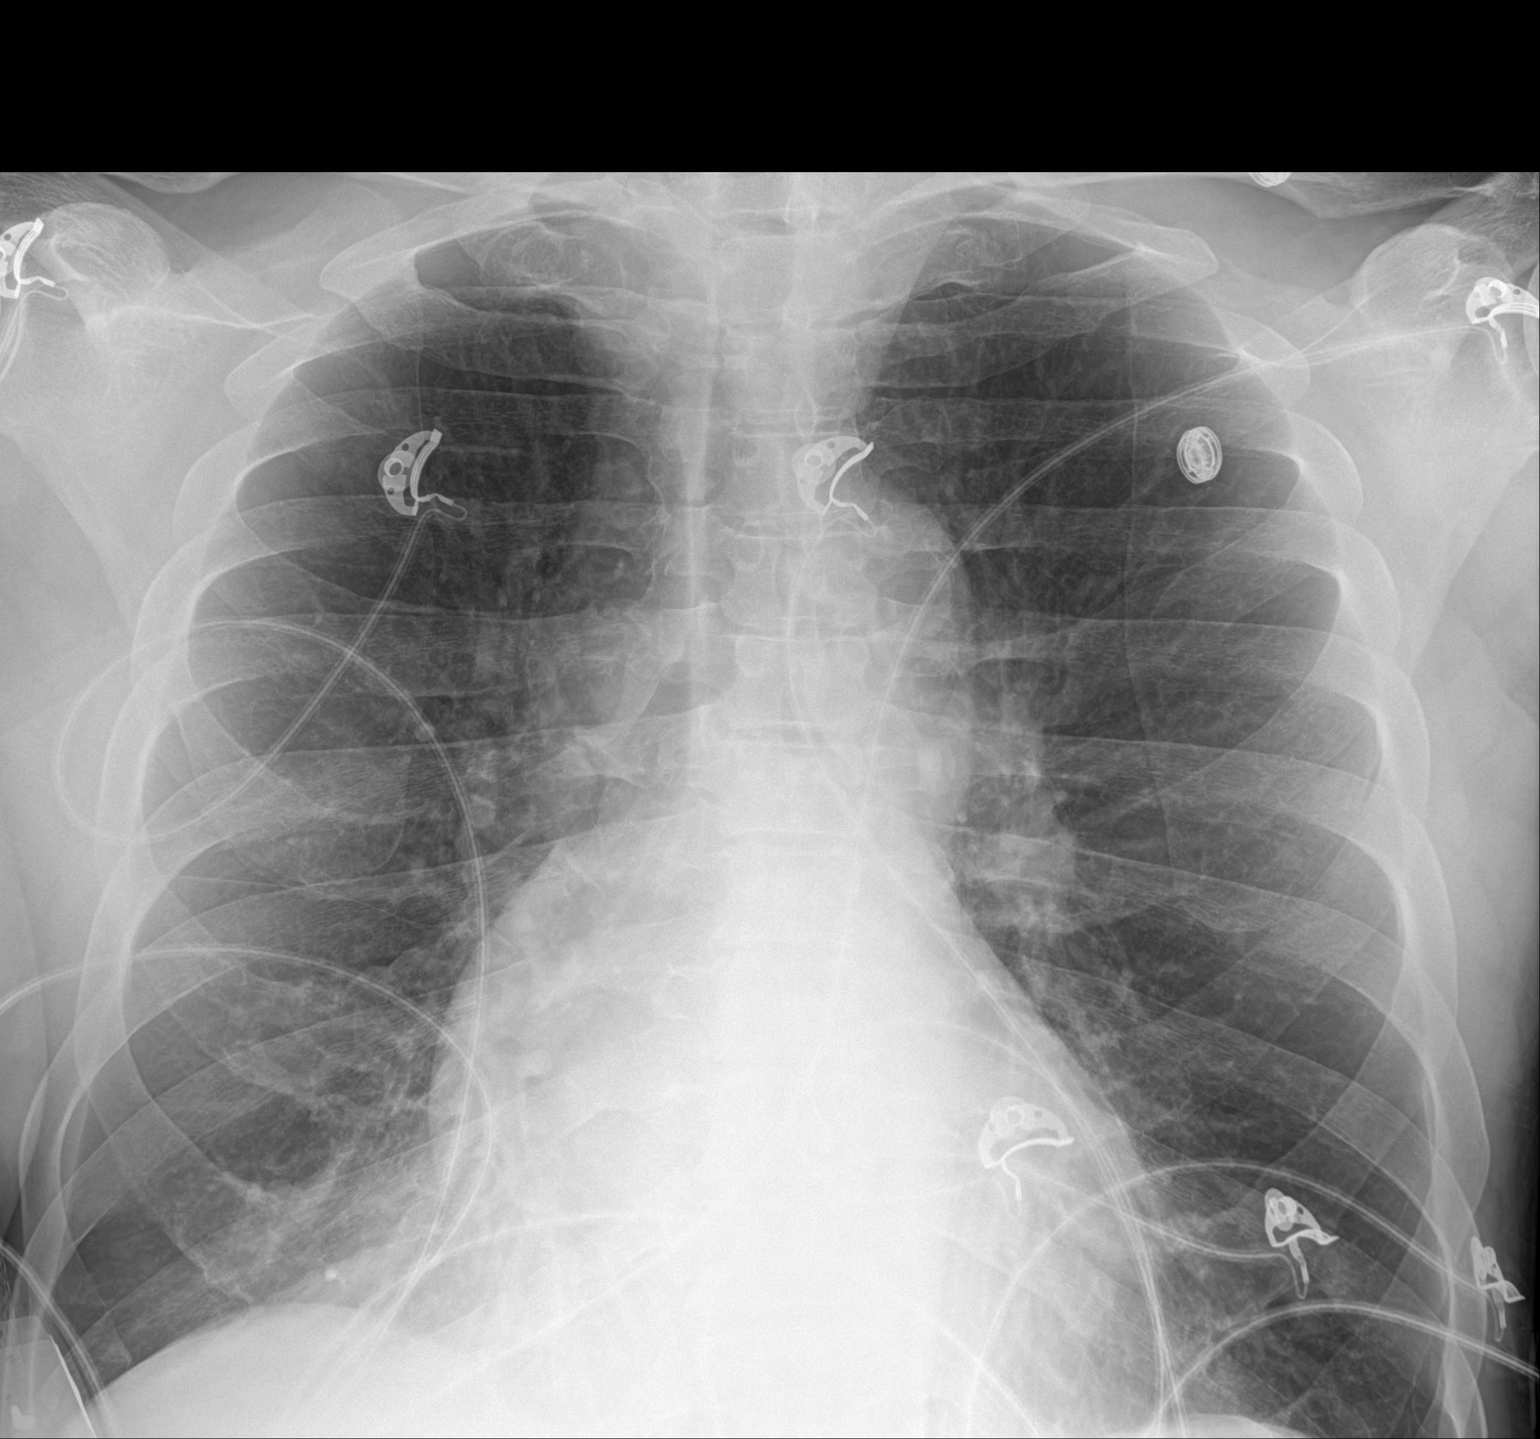
[im 2/2]
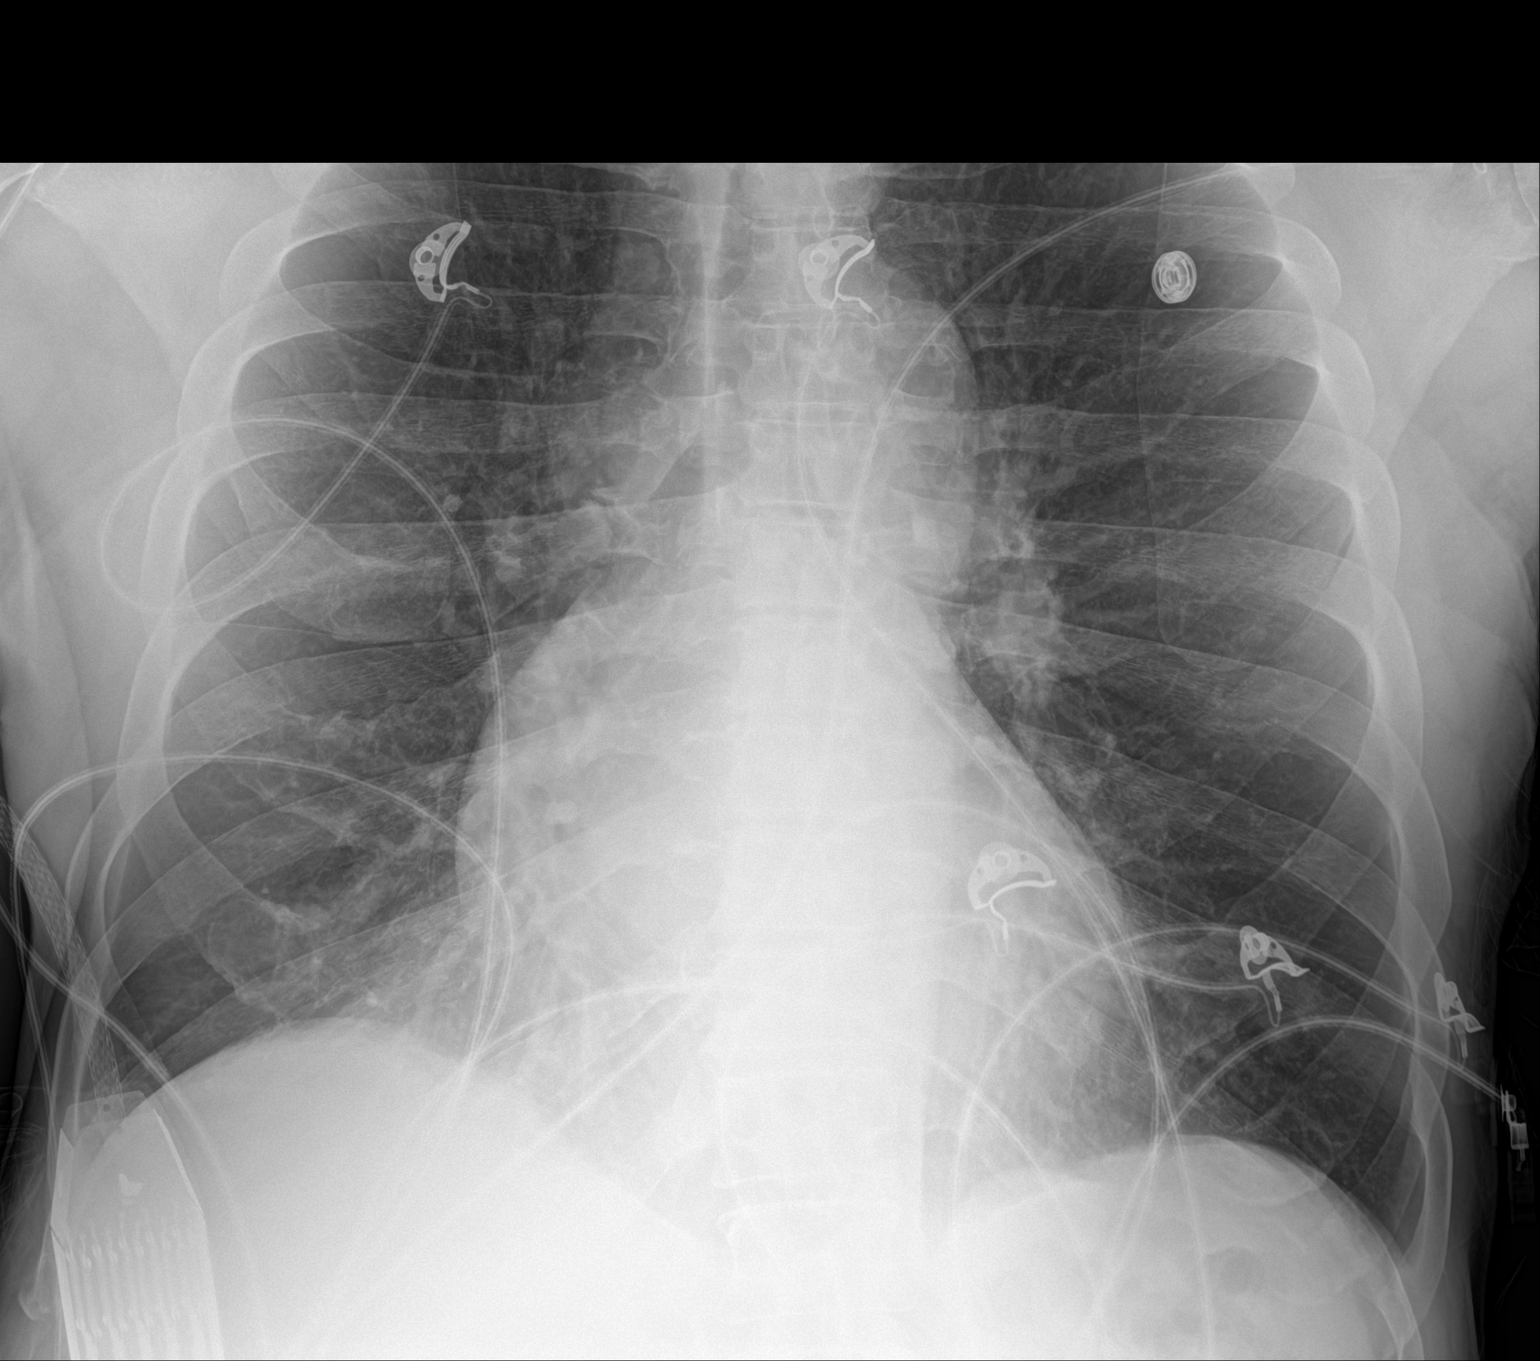

[2 of 2 positions shown; findings below may reference images not displayed]

FINDINGS: No focal consolidation, pleural effusion, or pneumothorax. The
cardiac silhouette is within normal limits. Atherosclerotic
calcification of the aorta. No acute osseous pathology.
IMPRESSION: No active disease.

## 2023-06-15 IMAGING — CT CT ABD-PELV W/ CM
2 of 5 series · 16 of 46 positions shown, 18 images · IV contrast (OMNIPAQUE 300)
Comparison: 05/06/2021

CLINICAL DATA: Pancreatitis, acute and severe

EXAM:
CT ABDOMEN AND PELVIS WITH CONTRAST
TECHNIQUE: Multidetector CT imaging of the abdomen and pelvis was performed
using the standard protocol following bolus administration of
intravenous contrast.

[Series 2: axial st · axial · 0.80mm/px · z∈[-541,-186]mm · 13 of 83 slices shown, 15 images]
[im 6/83  soft-tissue]
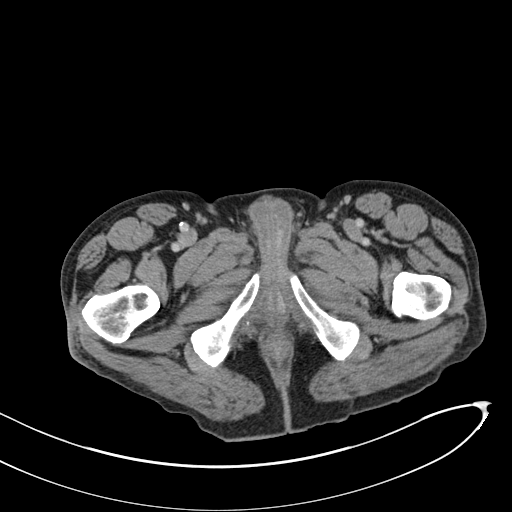
[im 6/83  bone]
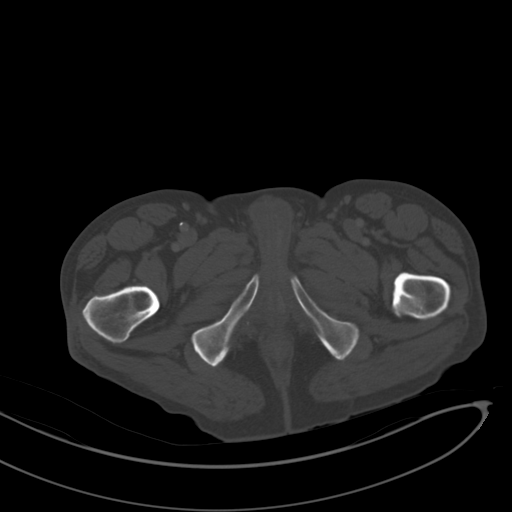
[im 11/83  soft-tissue]
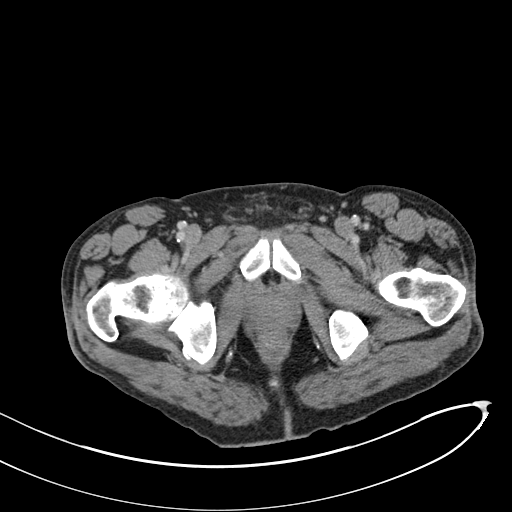
[im 16/83  soft-tissue]
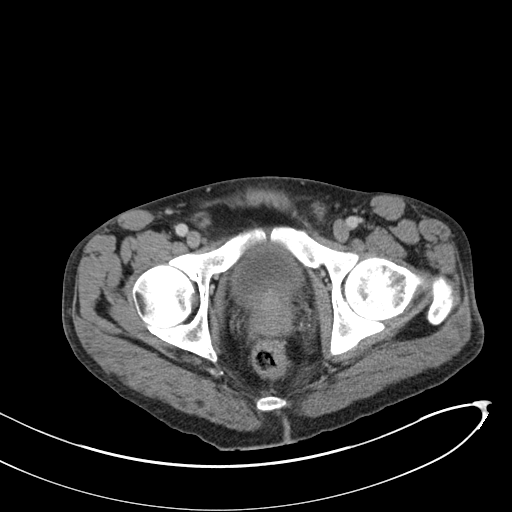
[im 26/83  soft-tissue]
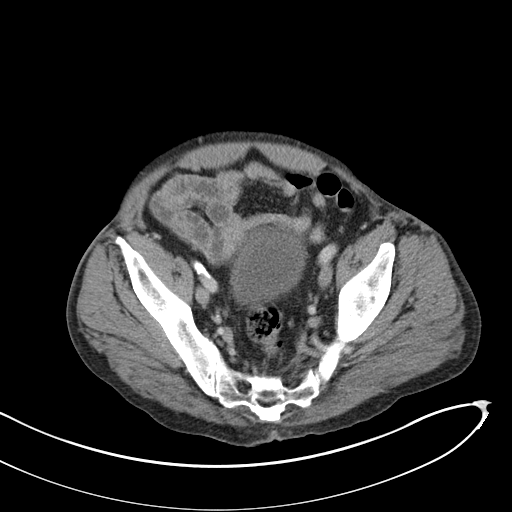
[im 31/83  soft-tissue]
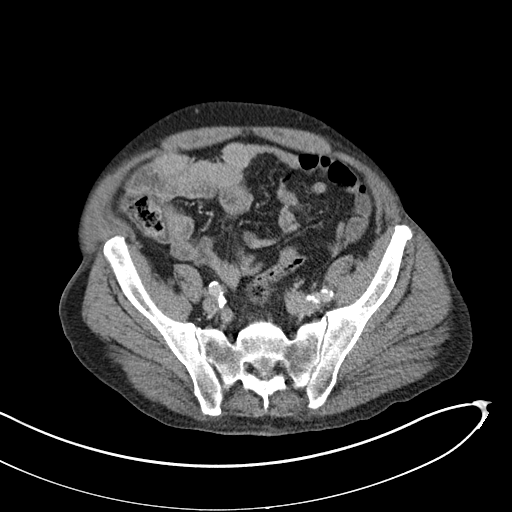
[im 36/83  soft-tissue]
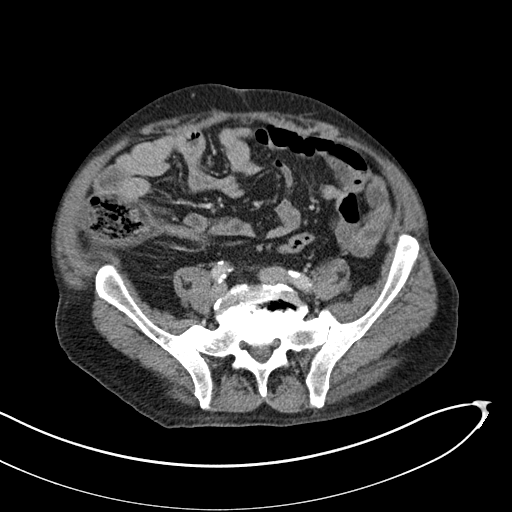
[im 42/83  soft-tissue]
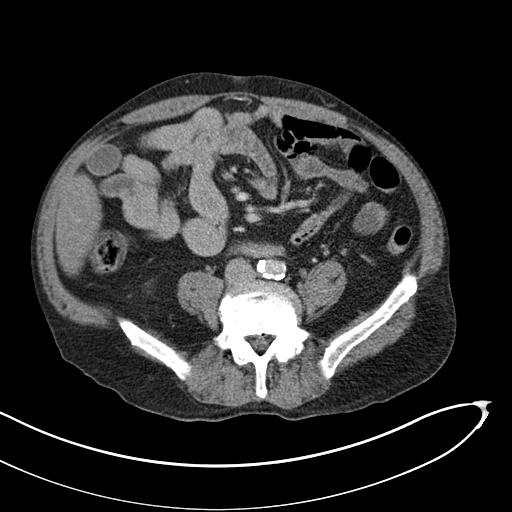
[im 47/83  soft-tissue]
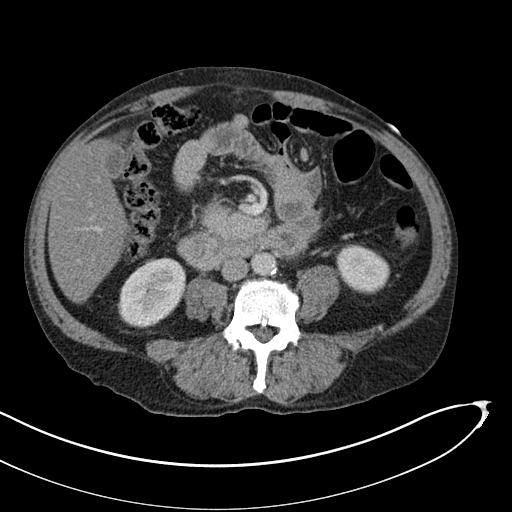
[im 52/83  soft-tissue]
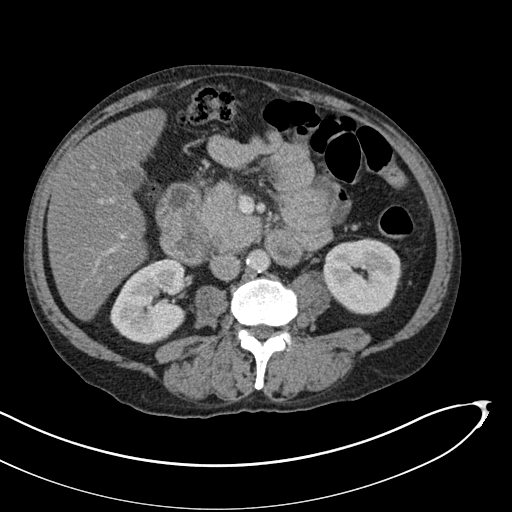
[im 52/83  bone]
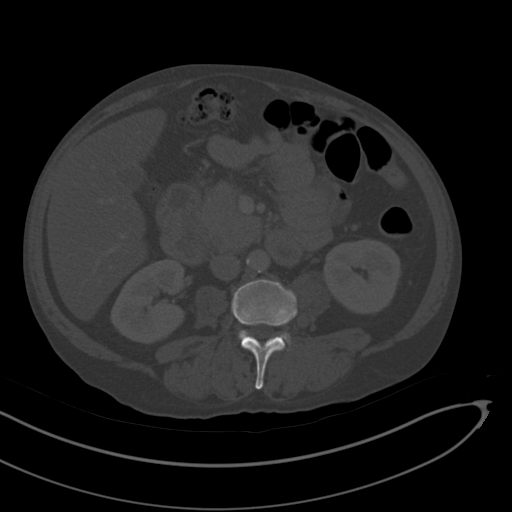
[im 57/83  soft-tissue]
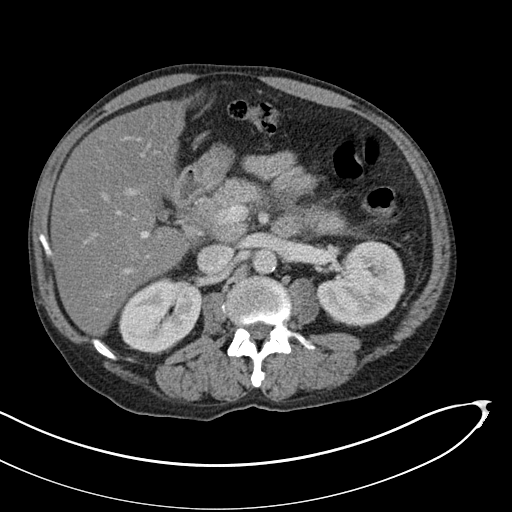
[im 67/83  soft-tissue]
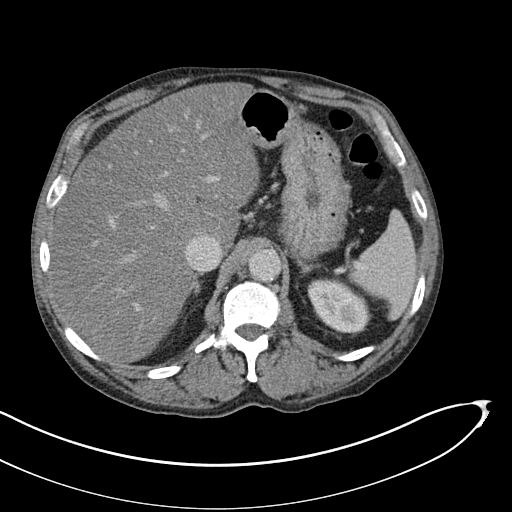
[im 72/83  soft-tissue]
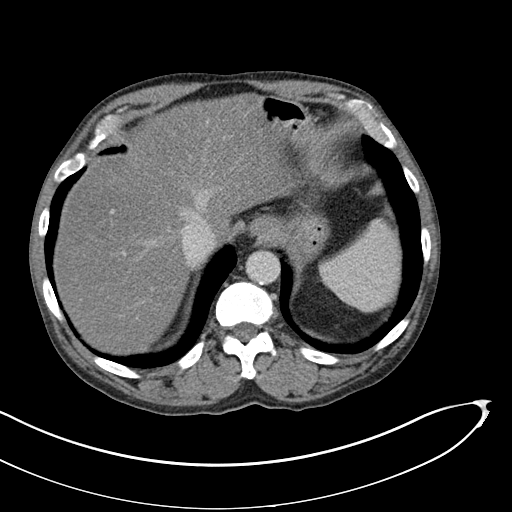
[im 77/83  soft-tissue]
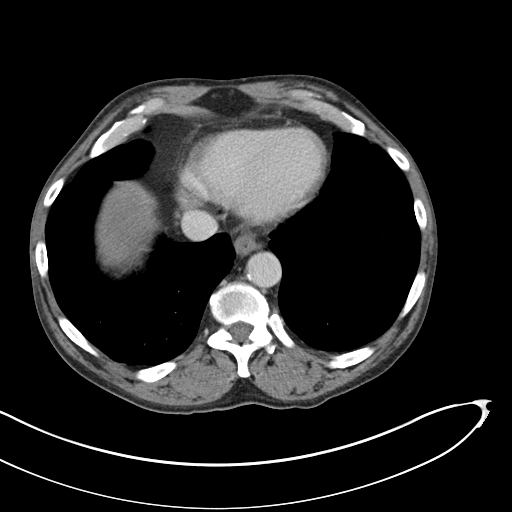

[Series 4: coronal st · coronal · 0.67mm/px · 3 of 151 slices shown]
[im 51/151  soft-tissue]
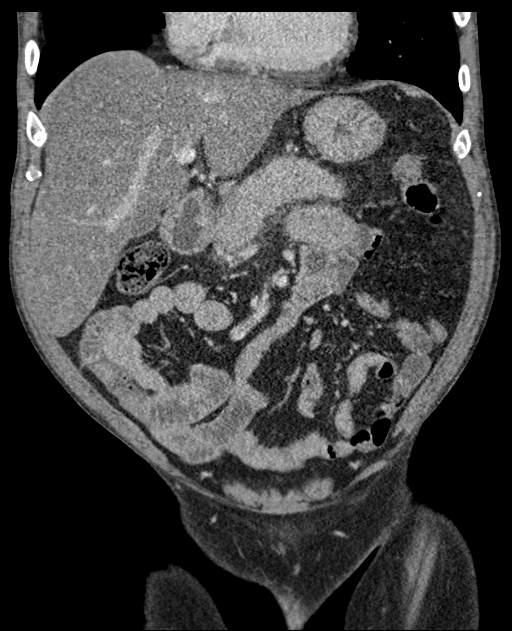
[im 67/151  soft-tissue]
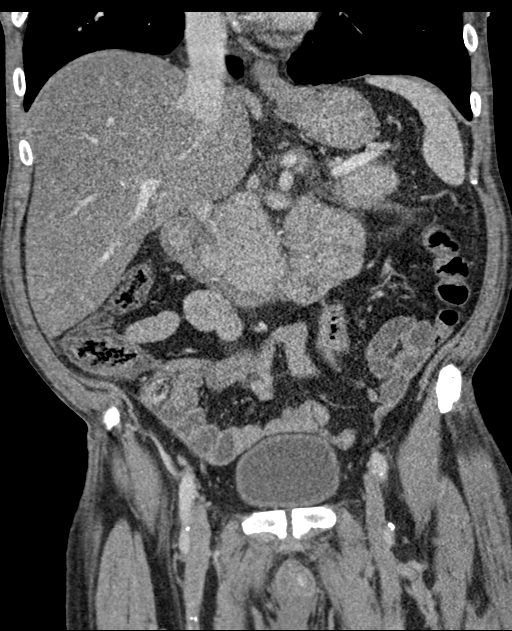
[im 84/151  soft-tissue]
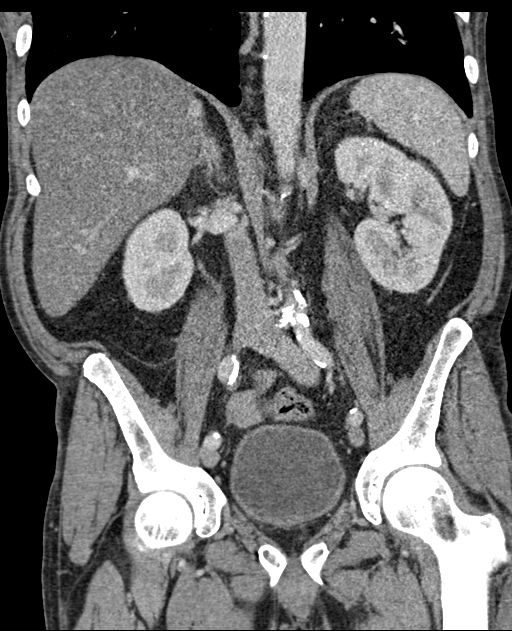

[16 of 46 positions shown; findings below may reference images not displayed]

RADIATION DOSE REDUCTION: This exam was performed according to the
departmental dose-optimization program which includes automated
exposure control, adjustment of the mA and/or kV according to
patient size and/or use of iterative reconstruction technique.

CONTRAST:  100mL OMNIPAQUE IOHEXOL 300 MG/ML  SOLN
FINDINGS: Lower chest:  No contributory findings.

Hepatobiliary: Prominent hepatic steatosis. No focal
abnormality.Small calcified gallstone. No signs of cholecystitis

Pancreas: Expanded and indistinct in margination. No necrosis or
fluid collection. Small focus of adjacent portal venous thrombosis
has developed since prior, eccentric and nonocclusive.

Spleen: Unremarkable.

Adrenals/Urinary Tract: Negative adrenals. No hydronephrosis or
stone. Unremarkable bladder.

Stomach/Bowel: Secondary appearing submucosal edema along the medial
aspect of the second portion duodenum. No bowel obstruction.

Vascular/Lymphatic: Portal venous findings noted above. Atheromatous
calcification of the aorta. No mass or adenopathy.

Reproductive:Enlargement of the central gland which projects into
the bladder base.

Other: No ascites or pneumoperitoneum. Umbilical hernia repair using
mesh

Musculoskeletal: No acute abnormalities. L4-5 and L5-S1 advanced
disc degeneration.
IMPRESSION: 1. Acute edematous pancreatitis. Small focus of adjacent portal
venous thrombus which is nonocclusive.
2. Hepatic steatosis and cholelithiasis.

## 2023-06-17 ENCOUNTER — Other Ambulatory Visit (HOSPITAL_COMMUNITY): Payer: Self-pay
